# Patient Record
Sex: Male | Born: 1935 | Race: White | Hispanic: No | Marital: Married | Smoking: Former smoker
Health system: Southern US, Community
[De-identification: ages and names within clinical notes are randomized; demographics above are authoritative.]

## PROBLEM LIST (undated history)

## (undated) ENCOUNTER — Encounter

## (undated) ENCOUNTER — Telehealth

## (undated) ENCOUNTER — Telehealth: Attending: Internal Medicine | Primary: Internal Medicine

## (undated) ENCOUNTER — Ambulatory Visit: Payer: MEDICARE

## (undated) ENCOUNTER — Encounter: Attending: Internal Medicine | Primary: Internal Medicine

## (undated) ENCOUNTER — Other Ambulatory Visit

## (undated) ENCOUNTER — Ambulatory Visit: Payer: MEDICARE | Attending: Internal Medicine | Primary: Internal Medicine

## (undated) ENCOUNTER — Encounter: Attending: Oncology | Primary: Oncology

## (undated) ENCOUNTER — Inpatient Hospital Stay

## (undated) ENCOUNTER — Encounter: Attending: Orthopaedic Trauma | Primary: Orthopaedic Trauma

## (undated) ENCOUNTER — Ambulatory Visit: Payer: Medicare (Managed Care) | Attending: Orthopaedic Trauma | Primary: Orthopaedic Trauma

## (undated) ENCOUNTER — Encounter: Attending: Adult Health | Primary: Adult Health

## (undated) ENCOUNTER — Ambulatory Visit

## (undated) ENCOUNTER — Ambulatory Visit: Payer: Medicare (Managed Care)

## (undated) ENCOUNTER — Telehealth: Attending: Oncology | Primary: Oncology

## (undated) DIAGNOSIS — E785 Hyperlipidemia, unspecified: Secondary | ICD-10-CM

## (undated) DIAGNOSIS — J189 Pneumonia, unspecified organism: Secondary | ICD-10-CM

## (undated) DIAGNOSIS — I10 Essential (primary) hypertension: Secondary | ICD-10-CM

## (undated) DIAGNOSIS — Z8619 Personal history of other infectious and parasitic diseases: Secondary | ICD-10-CM

## (undated) DIAGNOSIS — N400 Enlarged prostate without lower urinary tract symptoms: Secondary | ICD-10-CM

## (undated) DIAGNOSIS — E1122 Type 2 diabetes mellitus with diabetic chronic kidney disease: Secondary | ICD-10-CM

## (undated) DIAGNOSIS — L570 Actinic keratosis: Secondary | ICD-10-CM

## (undated) DIAGNOSIS — R011 Cardiac murmur, unspecified: Secondary | ICD-10-CM

## (undated) DIAGNOSIS — C44310 Basal cell carcinoma of skin of unspecified parts of face: Secondary | ICD-10-CM

## (undated) DIAGNOSIS — K219 Gastro-esophageal reflux disease without esophagitis: Secondary | ICD-10-CM

## (undated) DIAGNOSIS — D649 Anemia, unspecified: Secondary | ICD-10-CM

## (undated) DIAGNOSIS — N183 Chronic kidney disease, stage 3 (moderate): Secondary | ICD-10-CM

## (undated) DIAGNOSIS — C9140 Hairy cell leukemia not having achieved remission: Secondary | ICD-10-CM

## (undated) DIAGNOSIS — K5790 Diverticulosis of intestine, part unspecified, without perforation or abscess without bleeding: Secondary | ICD-10-CM

## (undated) DIAGNOSIS — E11319 Type 2 diabetes mellitus with unspecified diabetic retinopathy without macular edema: Secondary | ICD-10-CM

## (undated) DIAGNOSIS — IMO0001 Reserved for inherently not codable concepts without codable children: Secondary | ICD-10-CM

## (undated) DIAGNOSIS — E538 Deficiency of other specified B group vitamins: Secondary | ICD-10-CM

## (undated) DIAGNOSIS — K635 Polyp of colon: Secondary | ICD-10-CM

## (undated) DIAGNOSIS — Z8701 Personal history of pneumonia (recurrent): Secondary | ICD-10-CM

## (undated) HISTORY — PX: SKIN CANCER EXCISION: SHX779

## (undated) HISTORY — DX: Hairy cell leukemia not having achieved remission: C91.40

## (undated) HISTORY — DX: Chronic kidney disease, stage 3 (moderate): N18.3

## (undated) HISTORY — DX: Deficiency of other specified B group vitamins: E53.8

## (undated) HISTORY — DX: Type 2 diabetes mellitus with diabetic chronic kidney disease: E11.22

## (undated) HISTORY — DX: Cardiac murmur, unspecified: R01.1

## (undated) HISTORY — DX: Type 2 diabetes mellitus with unspecified diabetic retinopathy without macular edema: E11.319

## (undated) HISTORY — DX: Diverticulosis of intestine, part unspecified, without perforation or abscess without bleeding: K57.90

## (undated) HISTORY — DX: Pneumonia, unspecified organism: J18.9

## (undated) HISTORY — DX: Personal history of pneumonia (recurrent): Z87.01

## (undated) HISTORY — DX: Benign prostatic hyperplasia without lower urinary tract symptoms: N40.0

## (undated) HISTORY — PX: CATARACT EXTRACTION W/ INTRAOCULAR LENS  IMPLANT, BILATERAL: SHX1307

## (undated) HISTORY — DX: Personal history of other infectious and parasitic diseases: Z86.19

## (undated) HISTORY — DX: Polyp of colon: K63.5

## (undated) HISTORY — DX: Actinic keratosis: L57.0

---
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
## 1999-04-20 DIAGNOSIS — Z8701 Personal history of pneumonia (recurrent): Secondary | ICD-10-CM

## 1999-04-20 HISTORY — DX: Personal history of pneumonia (recurrent): Z87.01

## 2004-08-19 DIAGNOSIS — C9140 Hairy cell leukemia not having achieved remission: Secondary | ICD-10-CM

## 2004-08-19 HISTORY — DX: Hairy cell leukemia not having achieved remission: C91.40

## 2004-10-01 ENCOUNTER — Inpatient Hospital Stay: Payer: Self-pay | Admitting: Internal Medicine

## 2004-10-01 IMAGING — US ABDOMEN ULTRASOUND
1 series · 17 of 25 positions shown · non-contrast
Comparison: none

REASON FOR EXAM: panacytopena
COMMENTS:

[Series 1: abdomen ultrasound · 17 of 75 slices shown]
[im 1/75]
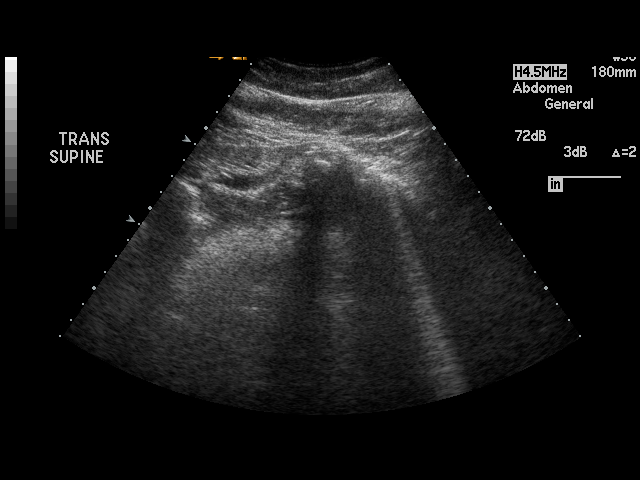
[im 7/75]
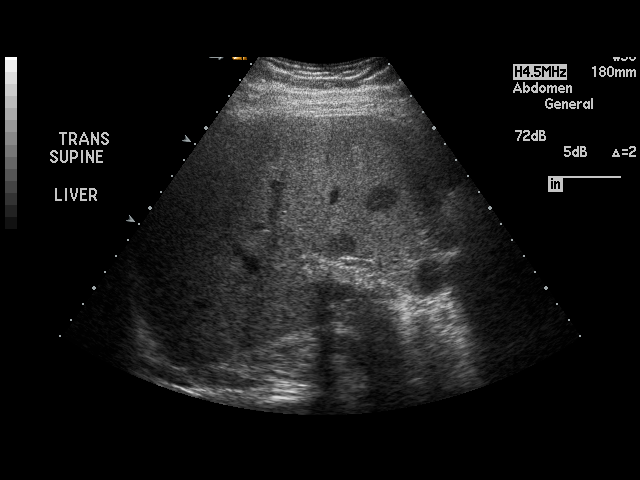
[im 10/75]
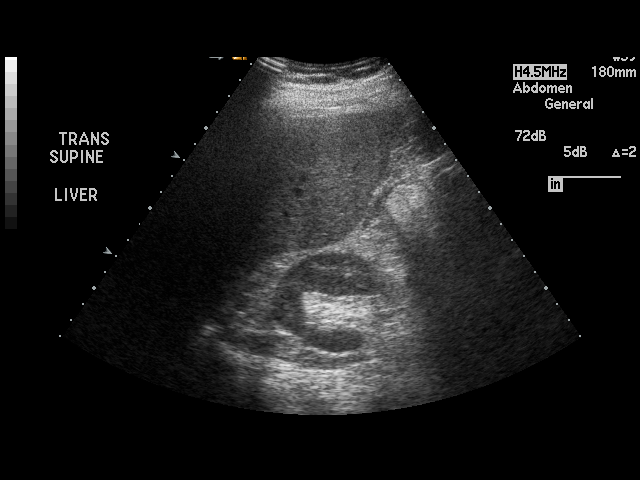
[im 16/75]
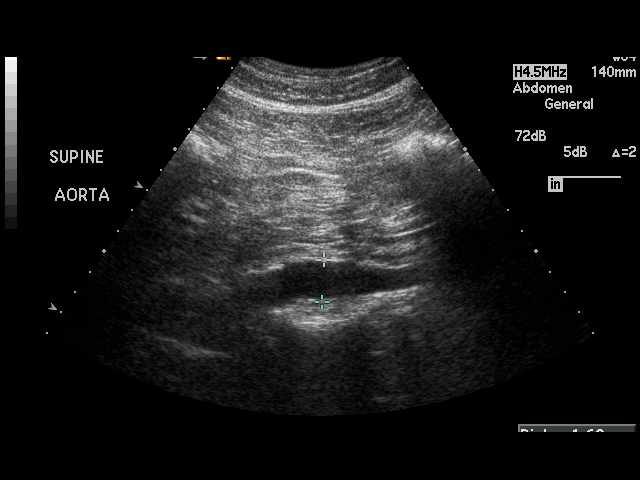
[im 19/75]
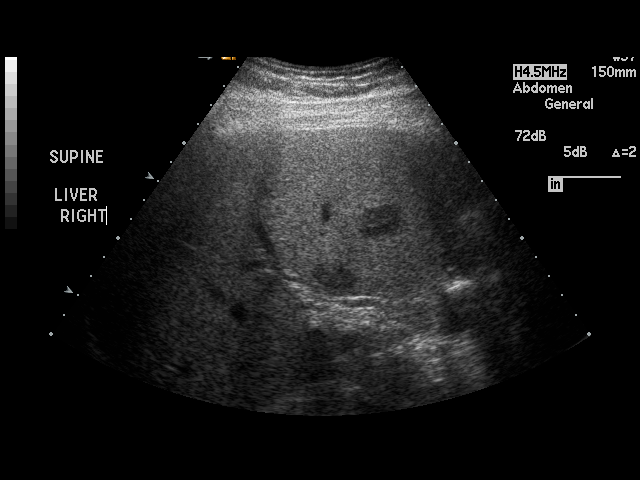
[im 25/75]
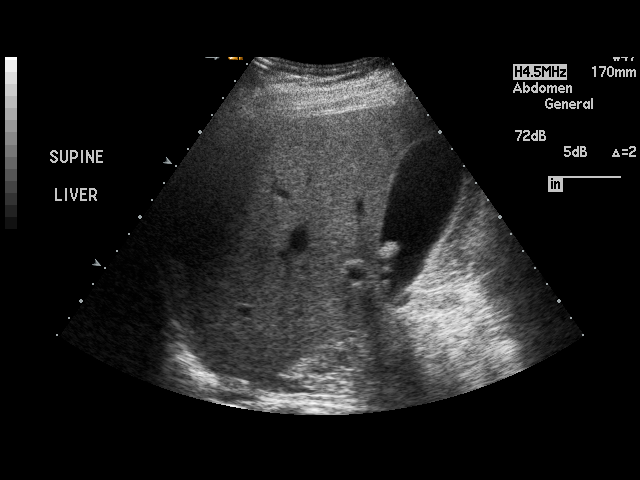
[im 28/75]
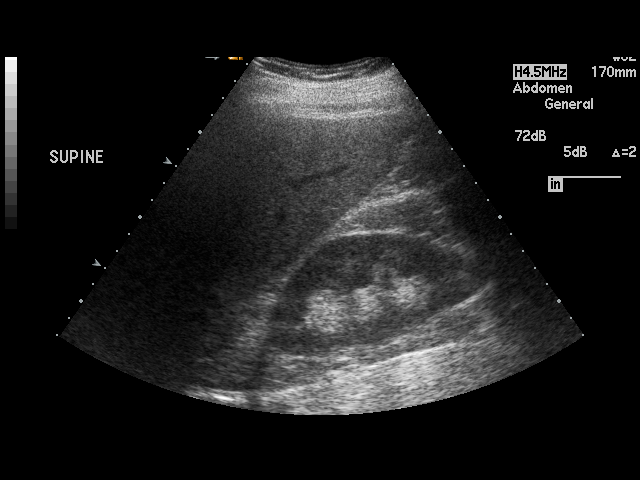
[im 34/75]
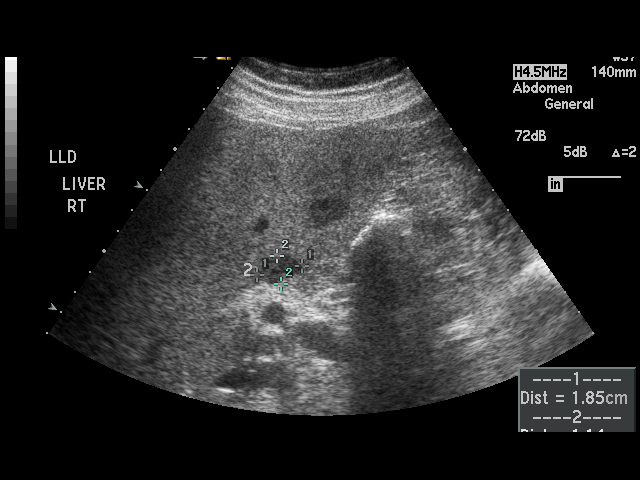
[im 38/75]
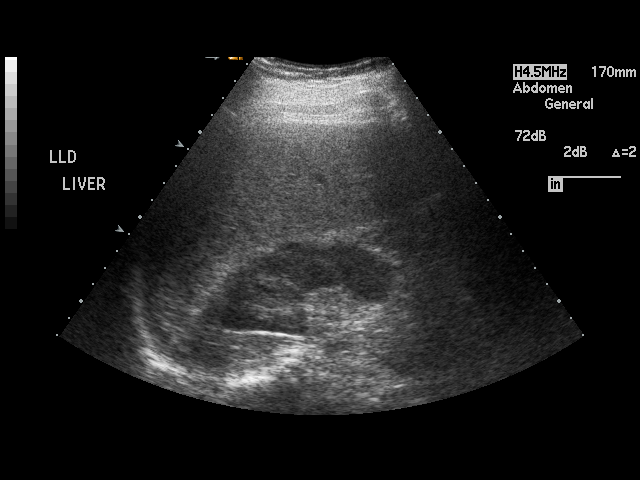
[im 41/75]
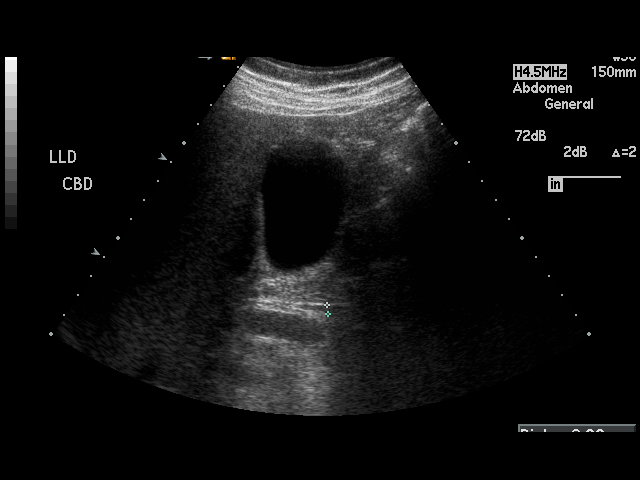
[im 47/75]
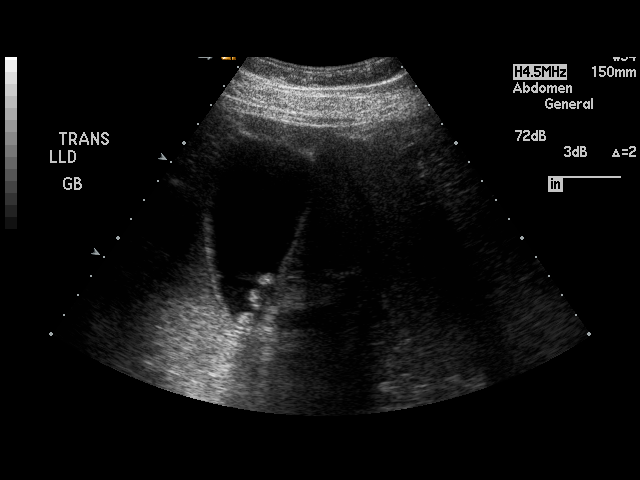
[im 50/75]
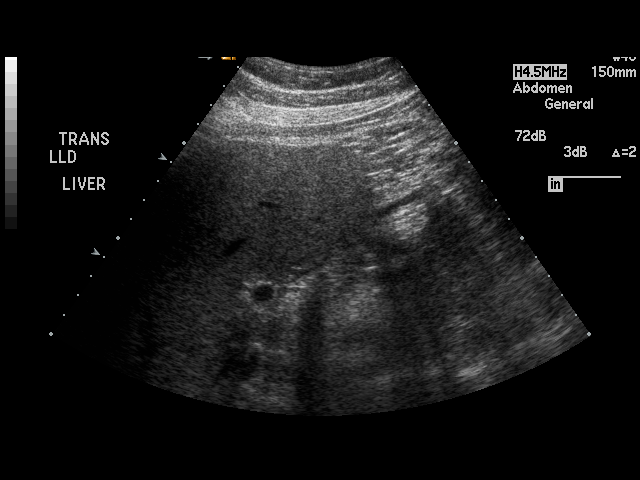
[im 56/75]
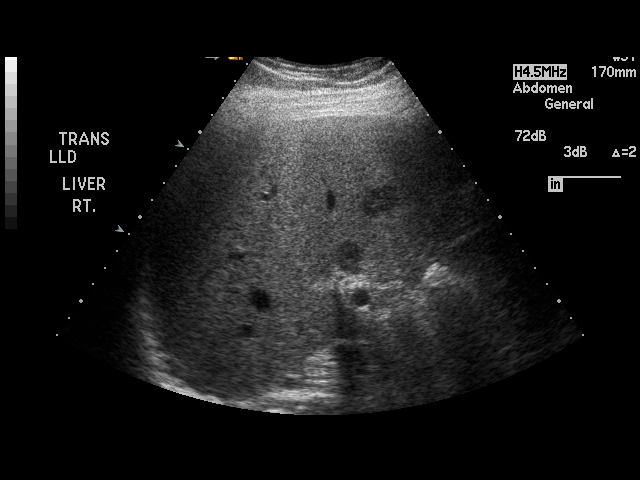
[im 59/75]
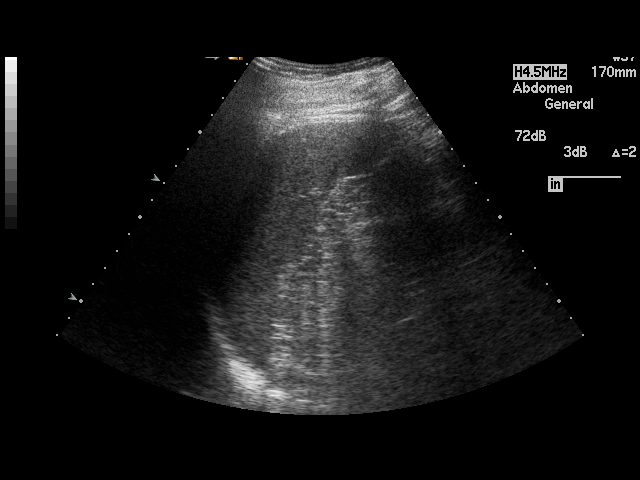
[im 65/75]
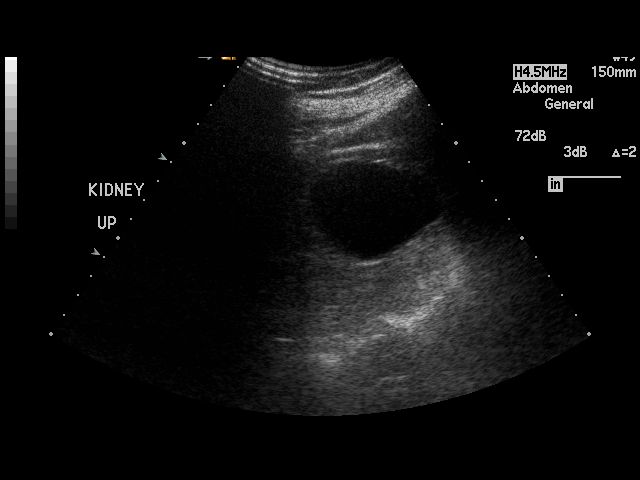
[im 68/75]
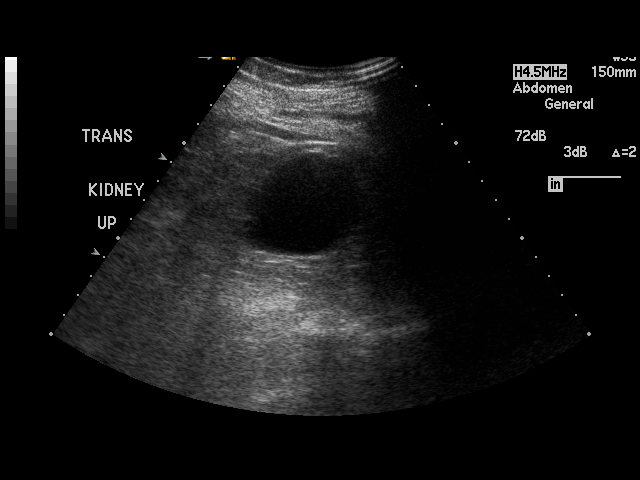
[im 75/75]
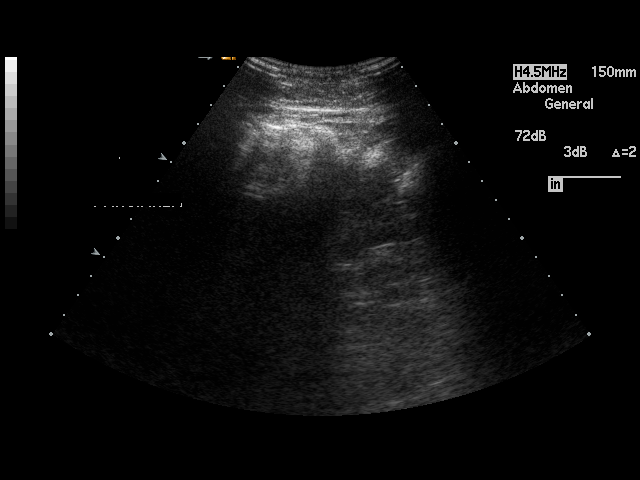

[17 of 25 positions shown; findings below may reference images not displayed]

PROCEDURE:     US  - US ABDOMEN GENERAL SURVEY  - [DATE]  [DATE]

RESULT:     The patient has unexplained low blood counts.

The spleen exhibits normal size measuring 12.7 cm in greatest dimension, but
measuring as little as 3.4 cm in transverse dimension. The liver exhibits
two areas of decreased echogenicity, one near the porta hepatis and the
other in the LEFT lobe.  These measure between 1.5 and 1.7 cm in diameter.
I see no intrahepatic ductal dilation.  The gallbladder is adequately
distended and contains multiple echogenic shadowing stones. There is no
sonographic Murphy sign.  The common bile duct is normal at 3.9 mm in
diameter.  The pancreas could not be demonstrated due to the presence of
bowel gas.

The LEFT kidney exhibits hypodensity in its upper pole measuring
approximately 4.5 x 5.1 x 4 cm.  This is most compatible with a benign
simple cyst.  The RIGHT kidney exhibits normal echotexture.  There is no
evidence of ascites.
IMPRESSION: 1)There are multiple gallstones present.

2)There are two hypoechoic areas in the liver.  One lies near the porta
hepatis while the second cyst lies in the LEFT lobe in a paramedian
location.

3)There is a benign simple appearing cyst in the upper pole of the LEFT
kidney measuring as much as 5.1 cm in diameter.

## 2004-10-03 ENCOUNTER — Ambulatory Visit: Payer: Self-pay | Admitting: Internal Medicine

## 2004-10-17 ENCOUNTER — Ambulatory Visit: Payer: Self-pay | Admitting: Family Medicine

## 2004-11-17 ENCOUNTER — Ambulatory Visit: Payer: Self-pay | Admitting: Family Medicine

## 2004-12-17 ENCOUNTER — Ambulatory Visit: Payer: Self-pay | Admitting: Internal Medicine

## 2005-01-17 ENCOUNTER — Ambulatory Visit: Payer: Self-pay | Admitting: Family Medicine

## 2005-03-06 ENCOUNTER — Ambulatory Visit: Payer: Self-pay | Admitting: Internal Medicine

## 2005-03-19 ENCOUNTER — Ambulatory Visit: Payer: Self-pay | Admitting: Internal Medicine

## 2005-05-09 ENCOUNTER — Ambulatory Visit: Payer: Self-pay | Admitting: Internal Medicine

## 2005-05-19 ENCOUNTER — Ambulatory Visit: Payer: Self-pay | Admitting: Internal Medicine

## 2005-05-20 IMAGING — US ABDOMEN ULTRASOUND
1 series · 8 of 8 positions shown · non-contrast
Comparison: none

REASON FOR EXAM: Hairy cell leukemia, anemia, neutropenia
COMMENTS:

[Series 1: abdomen ultrasound · 8 of 8 slices shown]
[im 1/8]
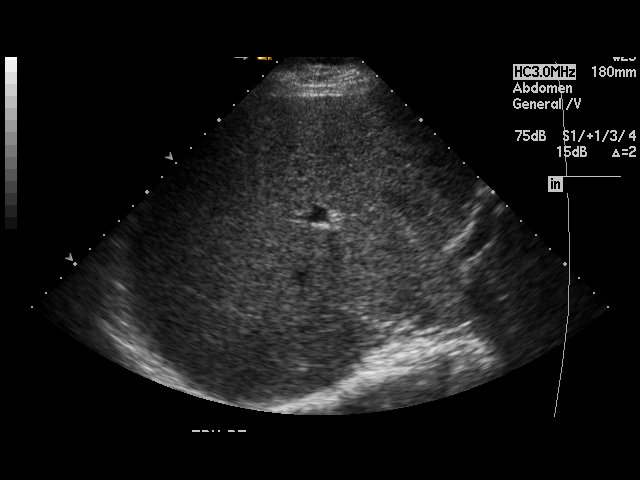
[im 2/8]
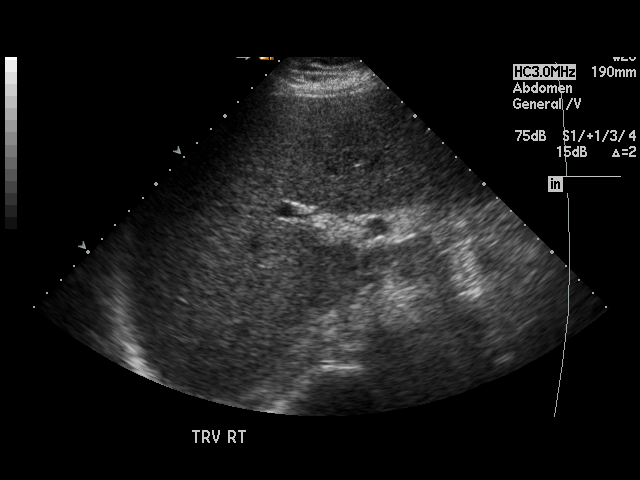
[im 3/8]
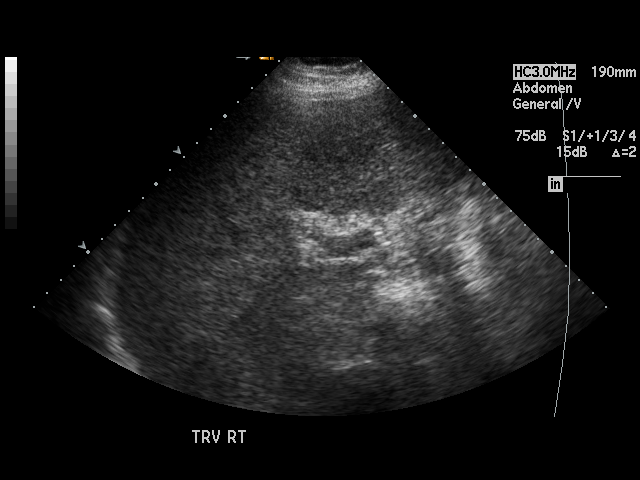
[im 4/8]
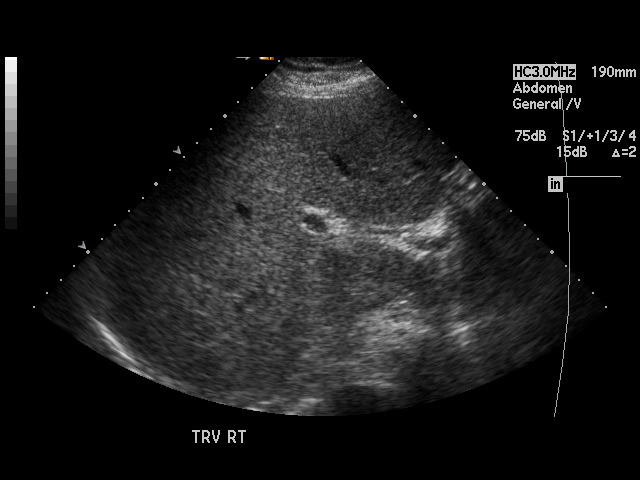
[im 5/8]
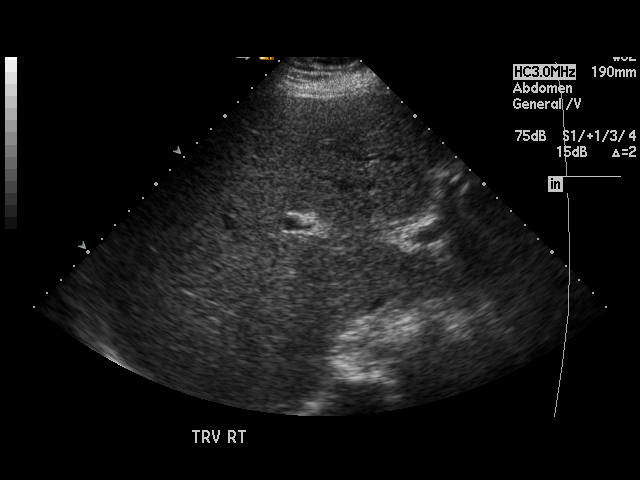
[im 6/8]
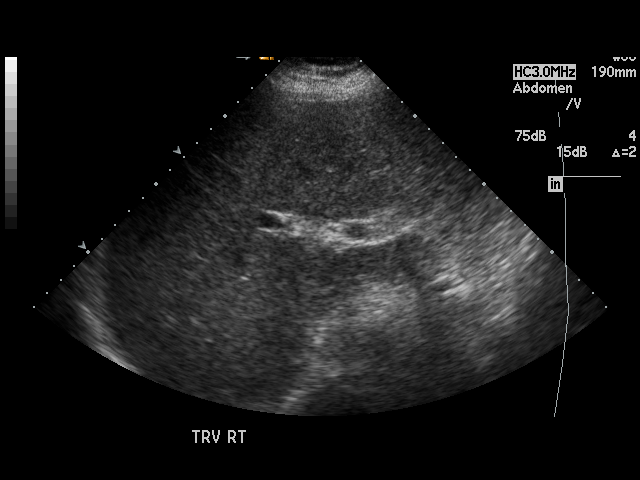
[im 7/8]
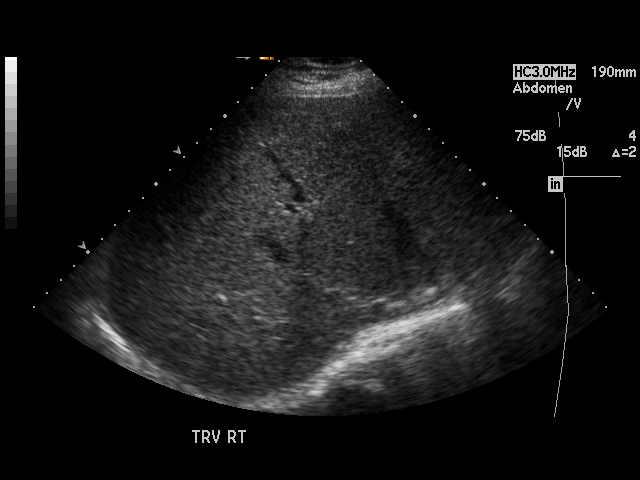
[im 8/8]
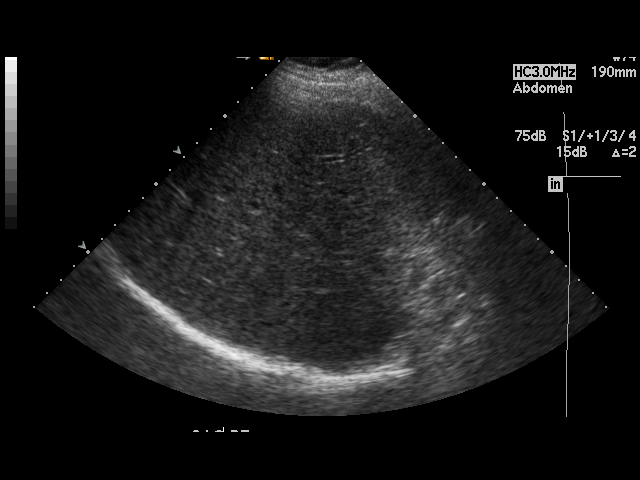

[8 of 8 positions shown; findings below may reference images not displayed]

PROCEDURE:     US  - US ABDOMEN GENERAL SURVEY  - [DATE] [DATE]

RESULT:     The hepatic echo pattern shows no specific abnormalities.  The
focal hypoechoic areas noted on the prior exam of [DATE] are no longer
seen.  The spleen is normal in size.  There are noted multiple echo
densities in the gallbladder compatible with gallstones.  There is no
thickening of the gallbladder wall.  The common bile duct measures 4.2 mm in
diameter which is within normal limits.  The kidneys show no hydronephrosis.
 There is a cyst at the upper pole of the LEFT kidney.  The cyst measures
approximately 5 cm at maximum diameter. No ascites is seen.  The pancreas is
normal in appearance.
IMPRESSION: Cholelithiasis.

Previously noted hypoechoic areas in the liver are no longer seen.

There is no ascites.

LEFT renal cyst.

## 2005-05-20 IMAGING — US ABDOMEN ULTRASOUND
1 series · 14 of 25 positions shown · non-contrast
Comparison: none

REASON FOR EXAM: Hairy cell leukemia, anemia, neutropenia
COMMENTS:

[Series 1: abdomen ultrasound · 0.39mm/px · 14 of 59 slices shown]
[im 1/59]
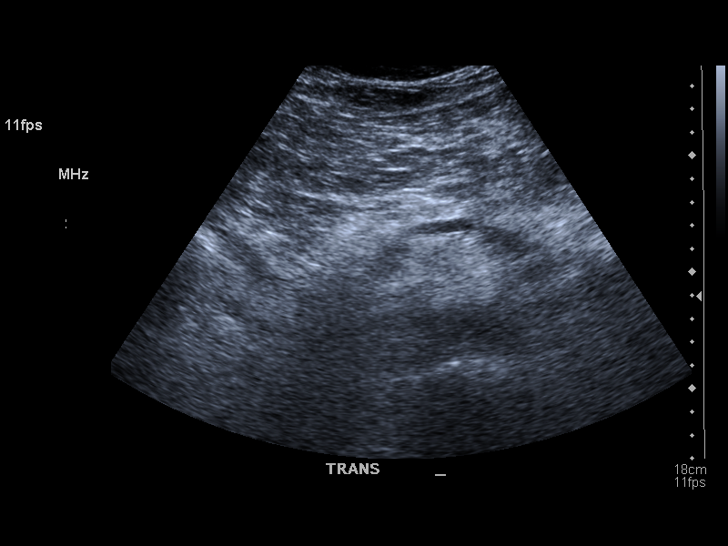
[im 5/59]
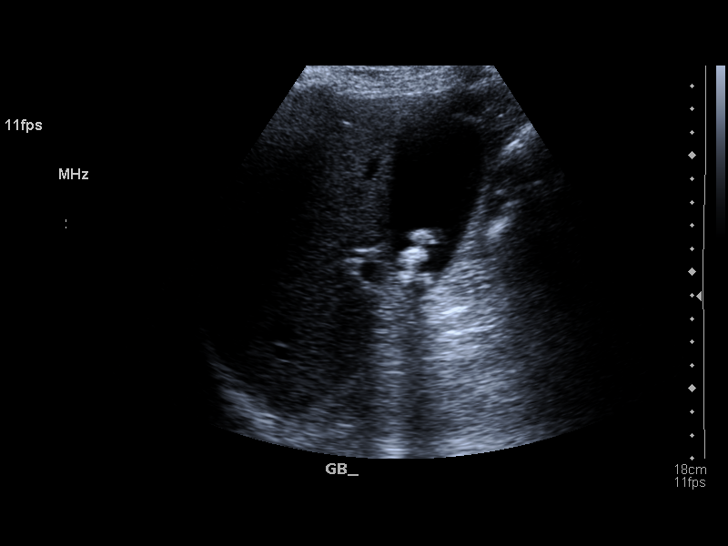
[im 10/59]
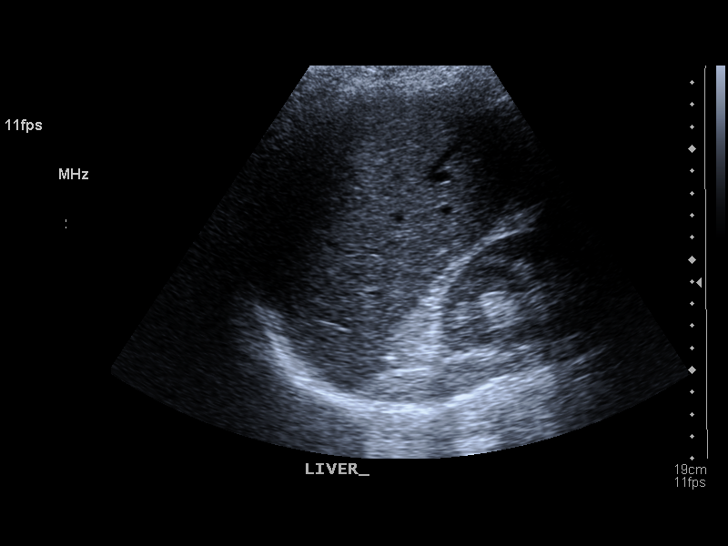
[im 15/59]
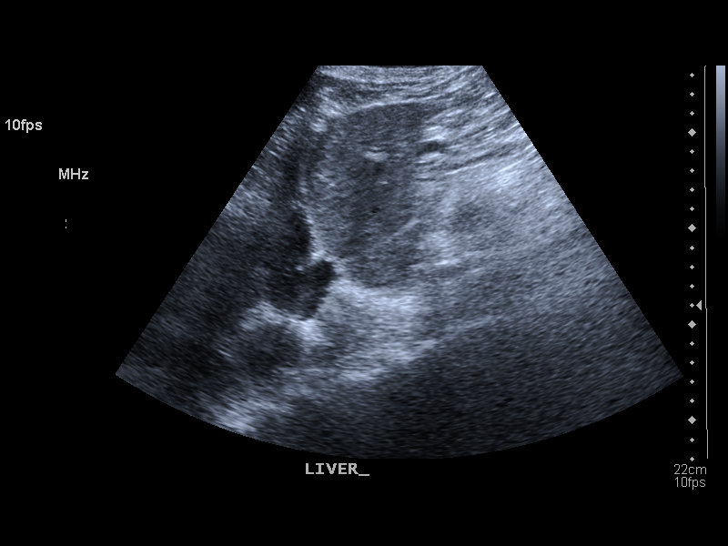
[im 20/59]
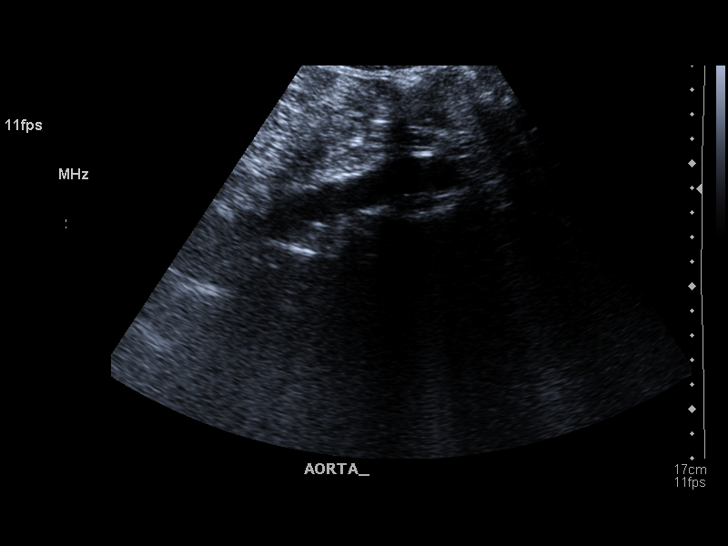
[im 22/59]
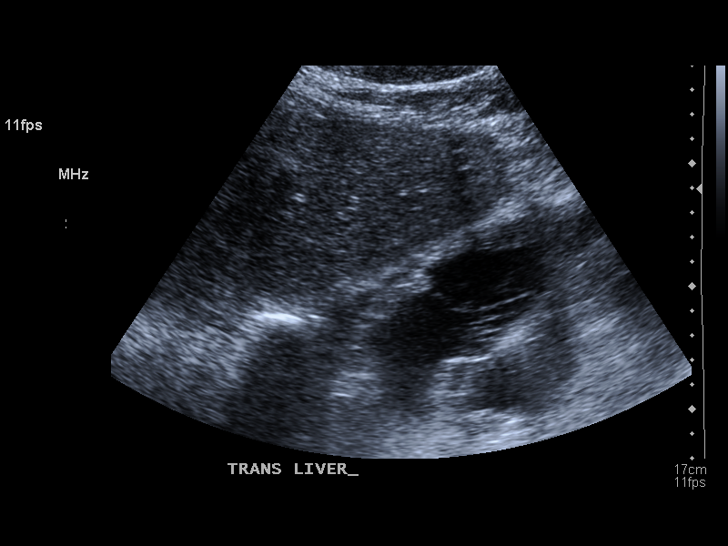
[im 27/59]
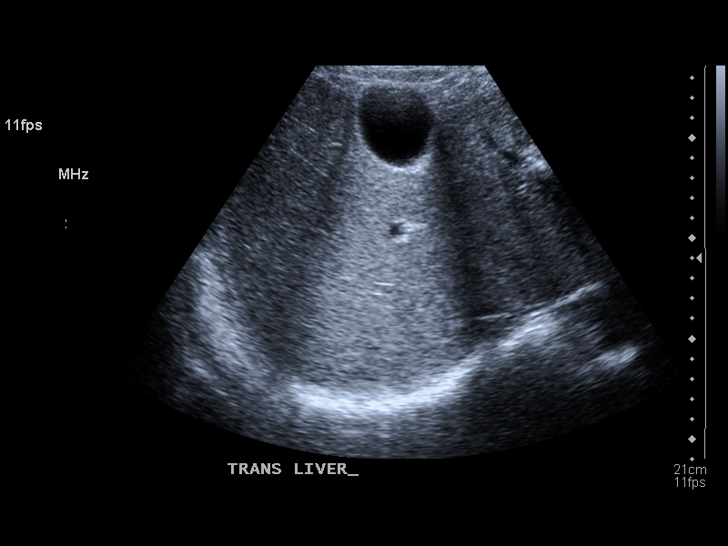
[im 32/59]
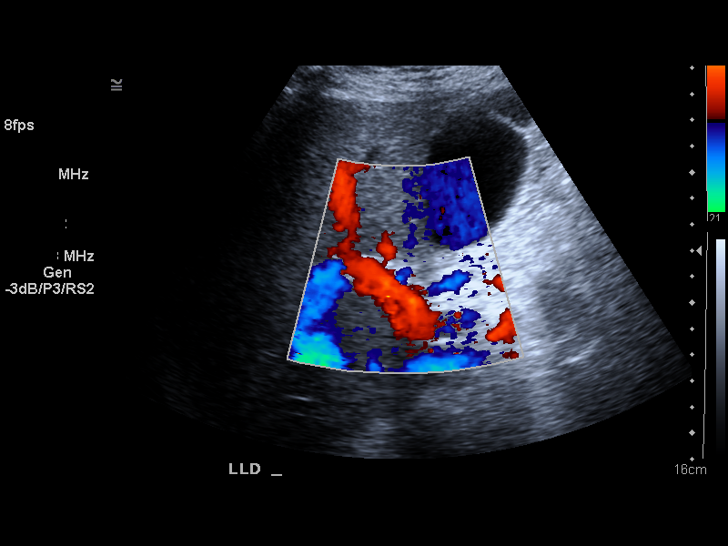
[im 37/59]
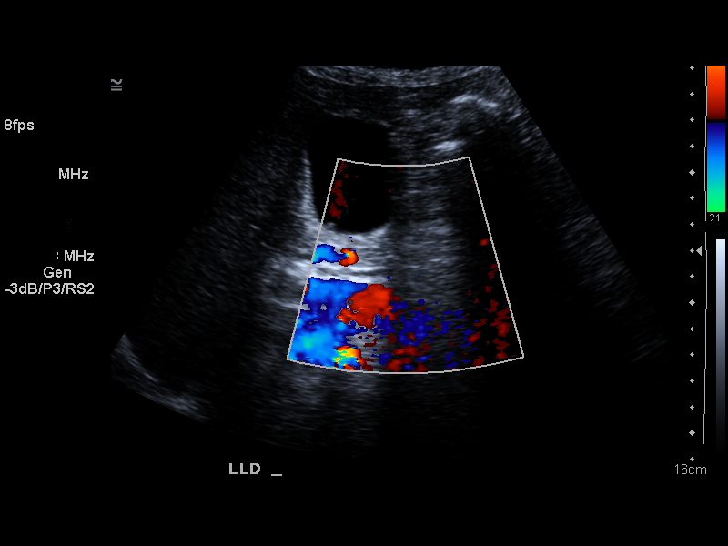
[im 39/59]
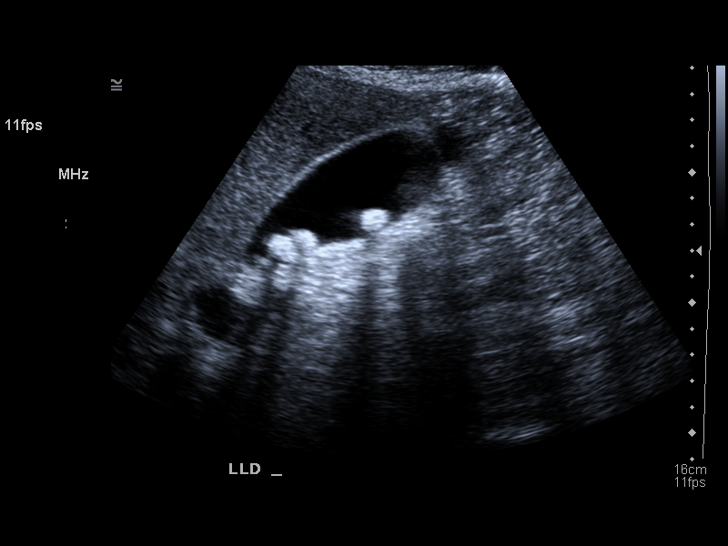
[im 44/59]
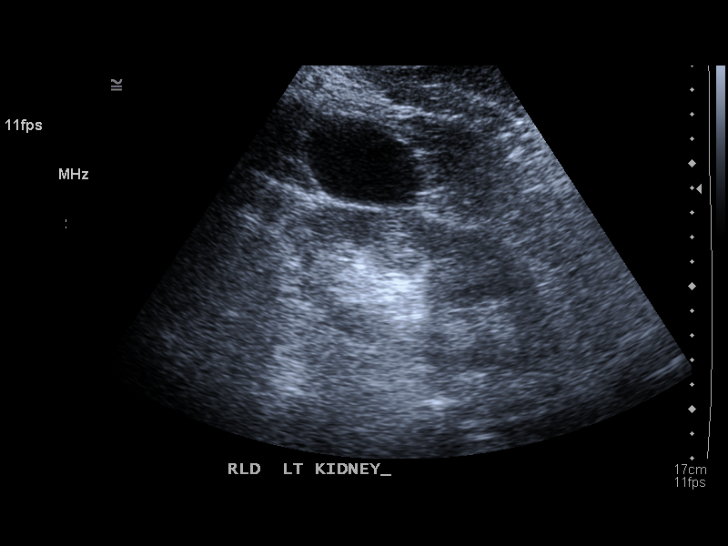
[im 49/59]
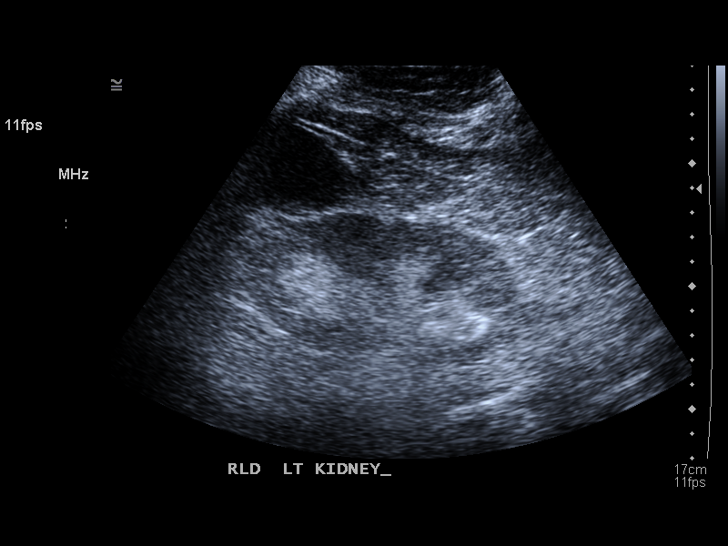
[im 54/59]
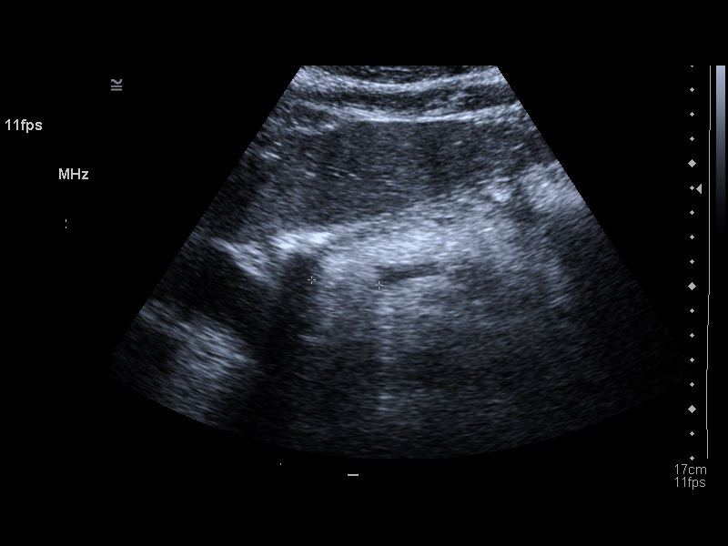
[im 59/59]
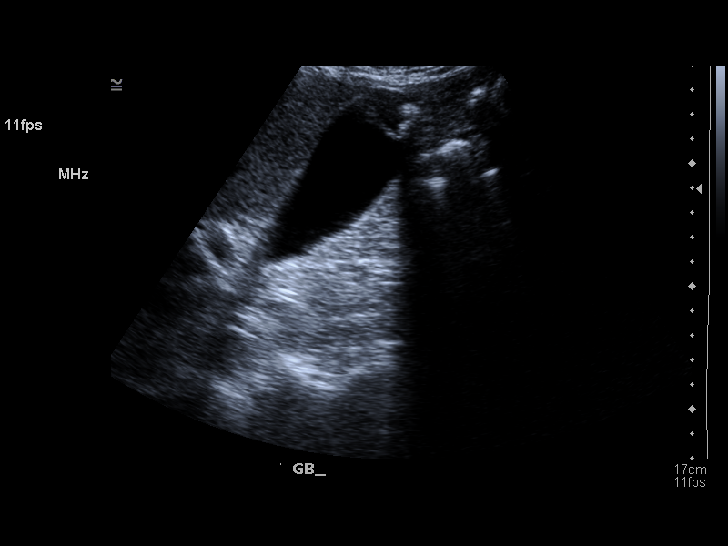

[14 of 25 positions shown; findings below may reference images not displayed]

PROCEDURE:     US  - US ABDOMEN GENERAL SURVEY  - [DATE] [DATE]

RESULT:     The hepatic echo pattern shows no specific abnormalities.  The
focal hypoechoic areas noted on the prior exam of [DATE] are no longer
seen.  The spleen is normal in size.  There are noted multiple echo
densities in the gallbladder compatible with gallstones.  There is no
thickening of the gallbladder wall.  The common bile duct measures 4.2 mm in
diameter which is within normal limits.  The kidneys show no hydronephrosis.
 There is a cyst at the upper pole of the LEFT kidney.  The cyst measures
approximately 5 cm at maximum diameter. No ascites is seen.  The pancreas is
normal in appearance.
IMPRESSION: Cholelithiasis.

Previously noted hypoechoic areas in the liver are no longer seen.

There is no ascites.

LEFT renal cyst.

## 2005-06-19 ENCOUNTER — Ambulatory Visit: Payer: Self-pay | Admitting: Internal Medicine

## 2005-06-28 ENCOUNTER — Inpatient Hospital Stay: Payer: Self-pay | Admitting: Internal Medicine

## 2005-06-30 IMAGING — CR DG CHEST 2V
1 series · 2 of 2 positions shown · non-contrast
Comparison: none

REASON FOR EXAM: NEUTROPENIC FEVER, HYPOXIA
COMMENTS:

[Series 2218: postero_anterior · 0.11mm/px · 2 of 2 slices shown]
[im 1/2]
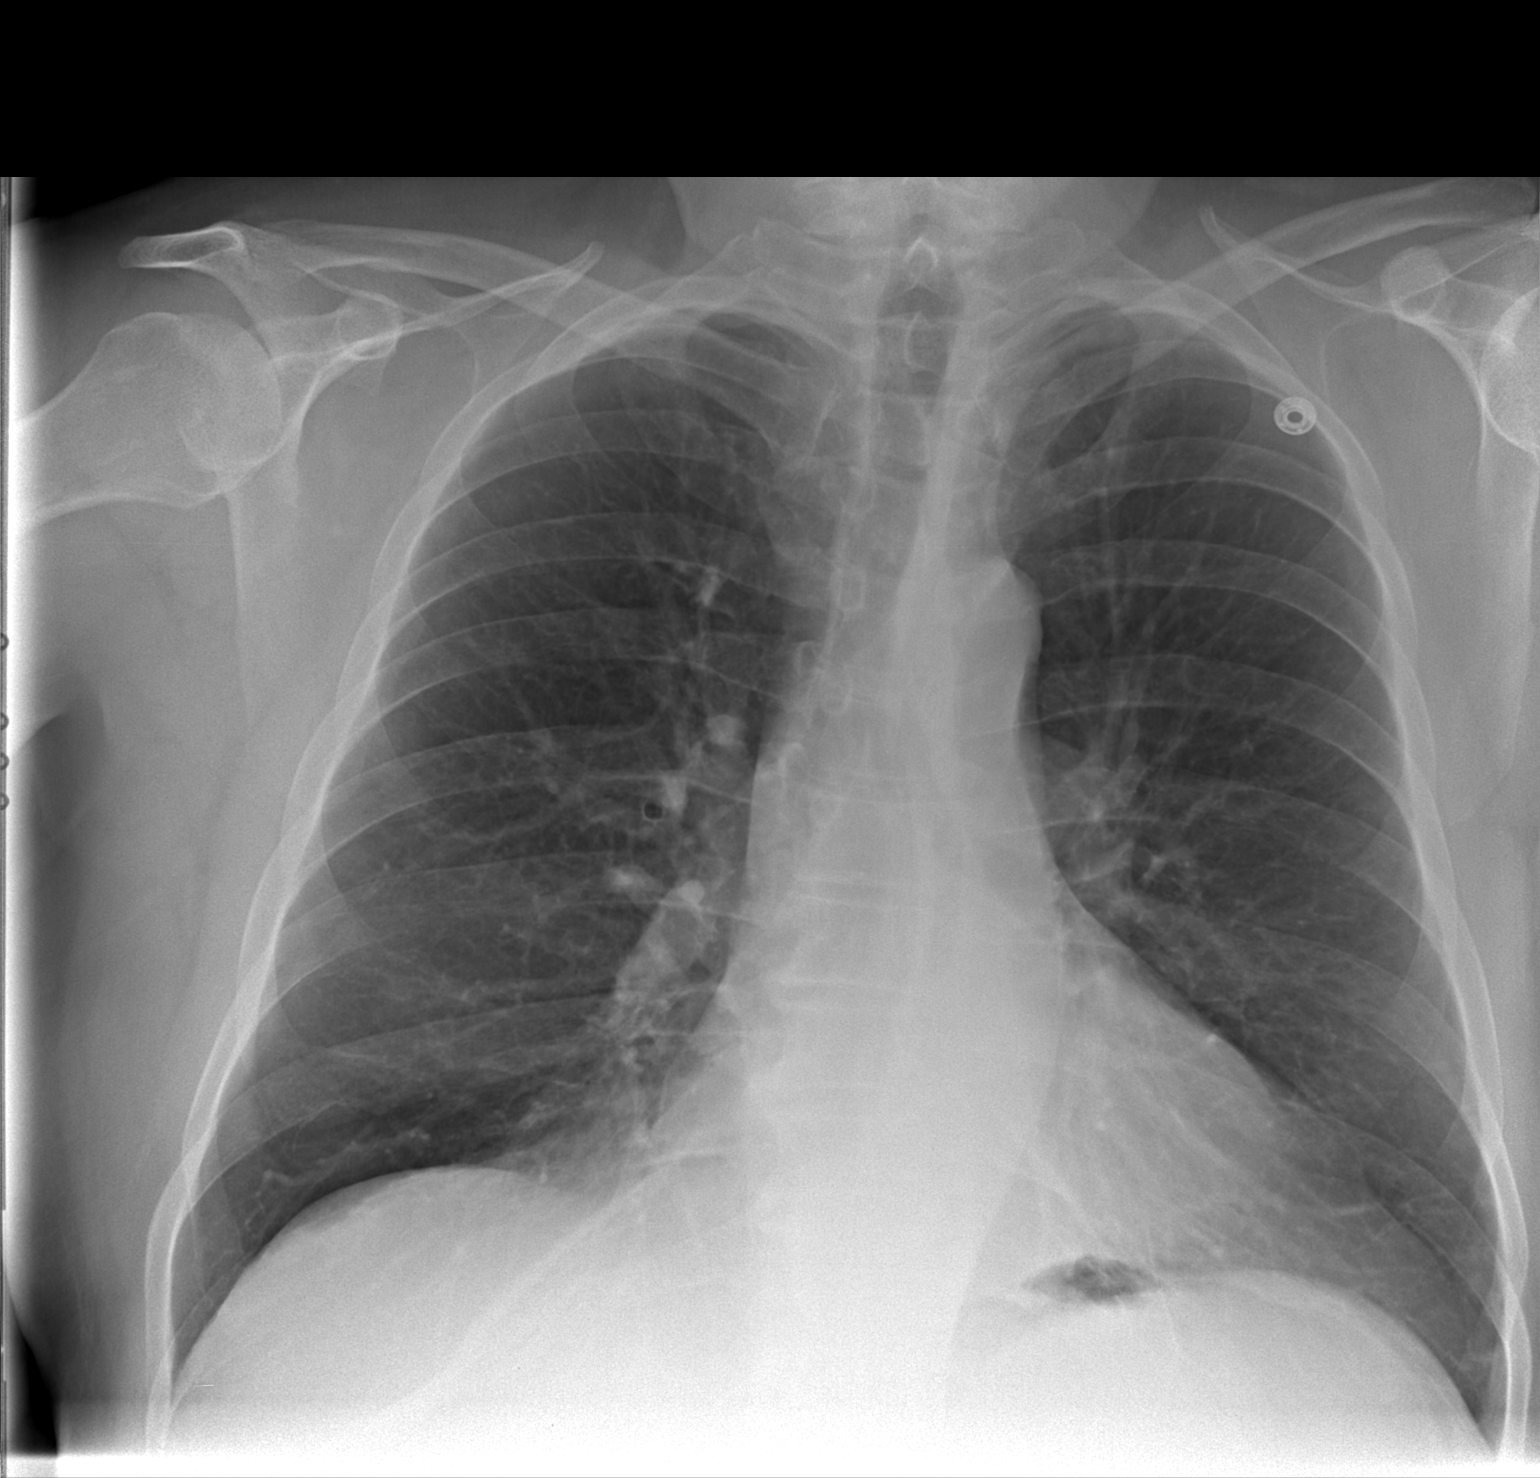
[im 2/2]
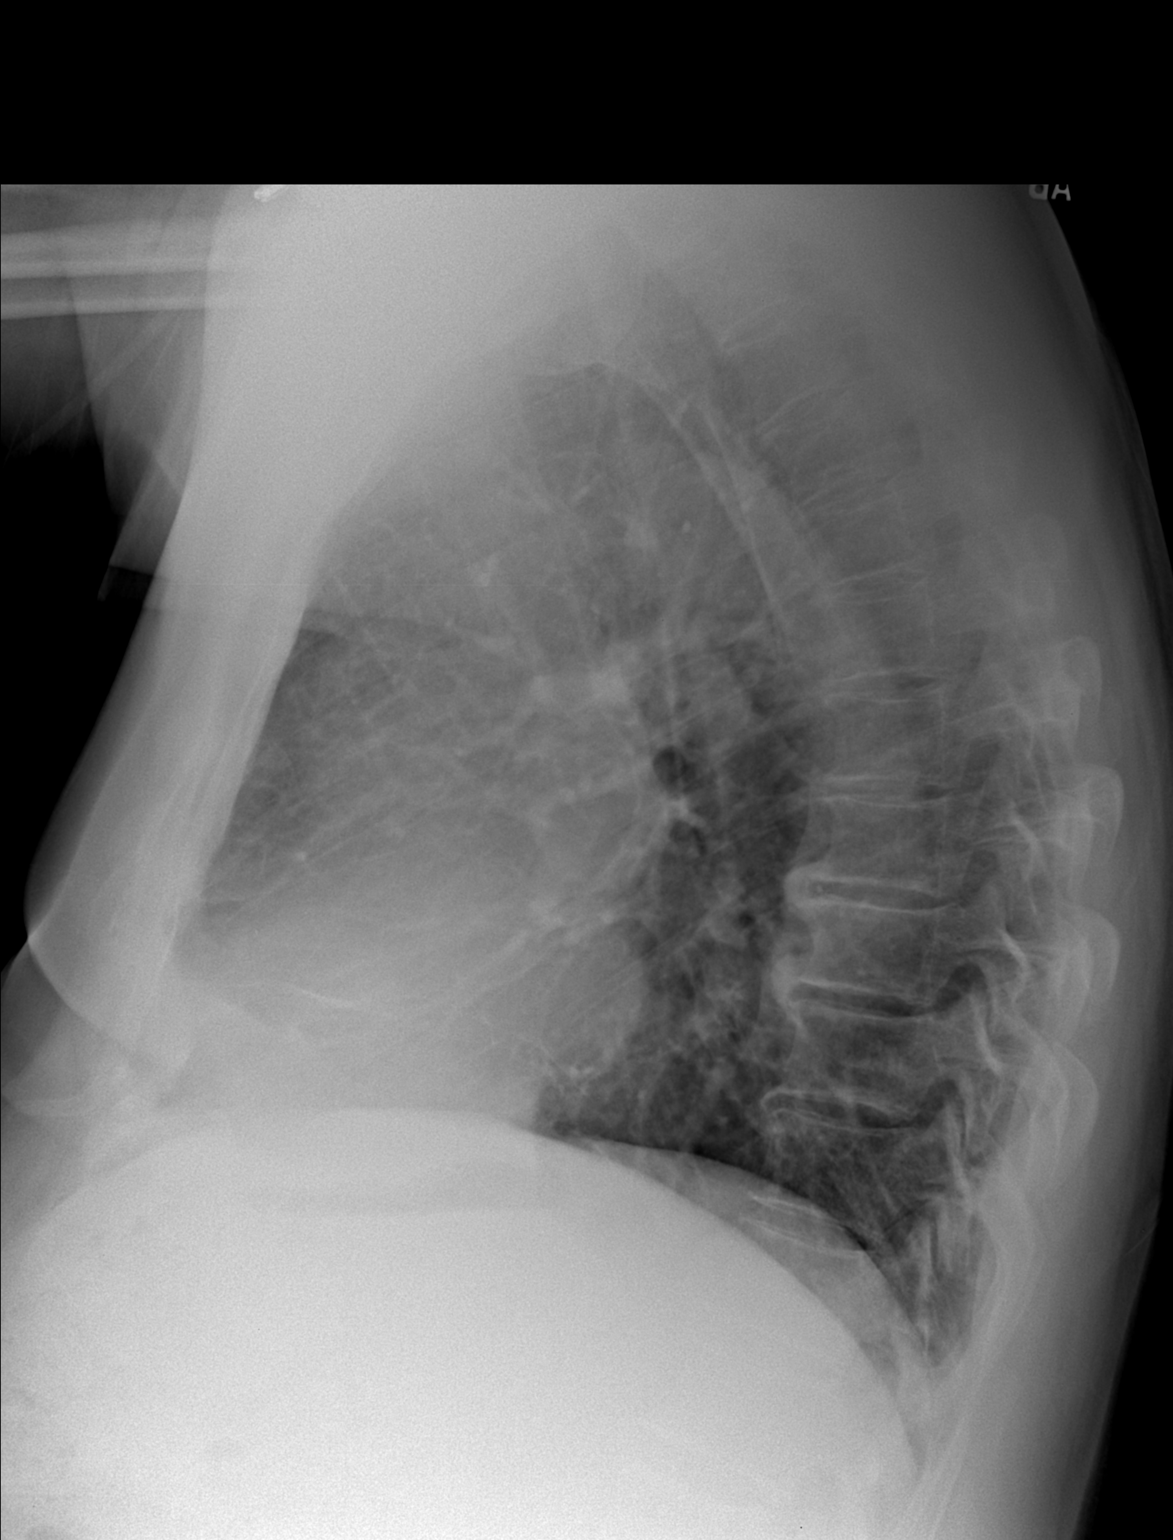

[2 of 2 positions shown; findings below may reference images not displayed]

PROCEDURE:     DXR - DXR CHEST PA (OR AP) AND LATERAL  - [DATE] [DATE]

RESULT:          Comparison is made to a study [DATE].

The lungs are adequately inflated.  There is no focal infiltrate.  The heart
is top normal in size.  There is tortuosity of the descending thoracic
aorta.  The mildly increased interstitial markings seen on a prior study
have improved.
IMPRESSION: 1.     There is improved appearance of the chest suggesting resolution of
atelectasis.  Followup films are recommended if the patient has any
persistent pulmonary symptoms.
2.     There may be an element of underlying COPD or reactive airway
disease.

## 2005-07-01 IMAGING — CR DG CHEST 1V PORT
1 series · 1 of 1 positions shown · non-contrast
Comparison: none

REASON FOR EXAM: recurrent neutropenic
COMMENTS:

PROCEDURE:     DXR - DXR PORTABLE CHEST SINGLE VIEW  - [DATE]  [DATE]
RESULT:     AP view of the chest is compared to the prior exam of [DATE].
The current exam shows the lung fields to be clear. No pneumonia,
pneumothorax or pleural effusion is seen. Heart size is normal.

[view not recorded]
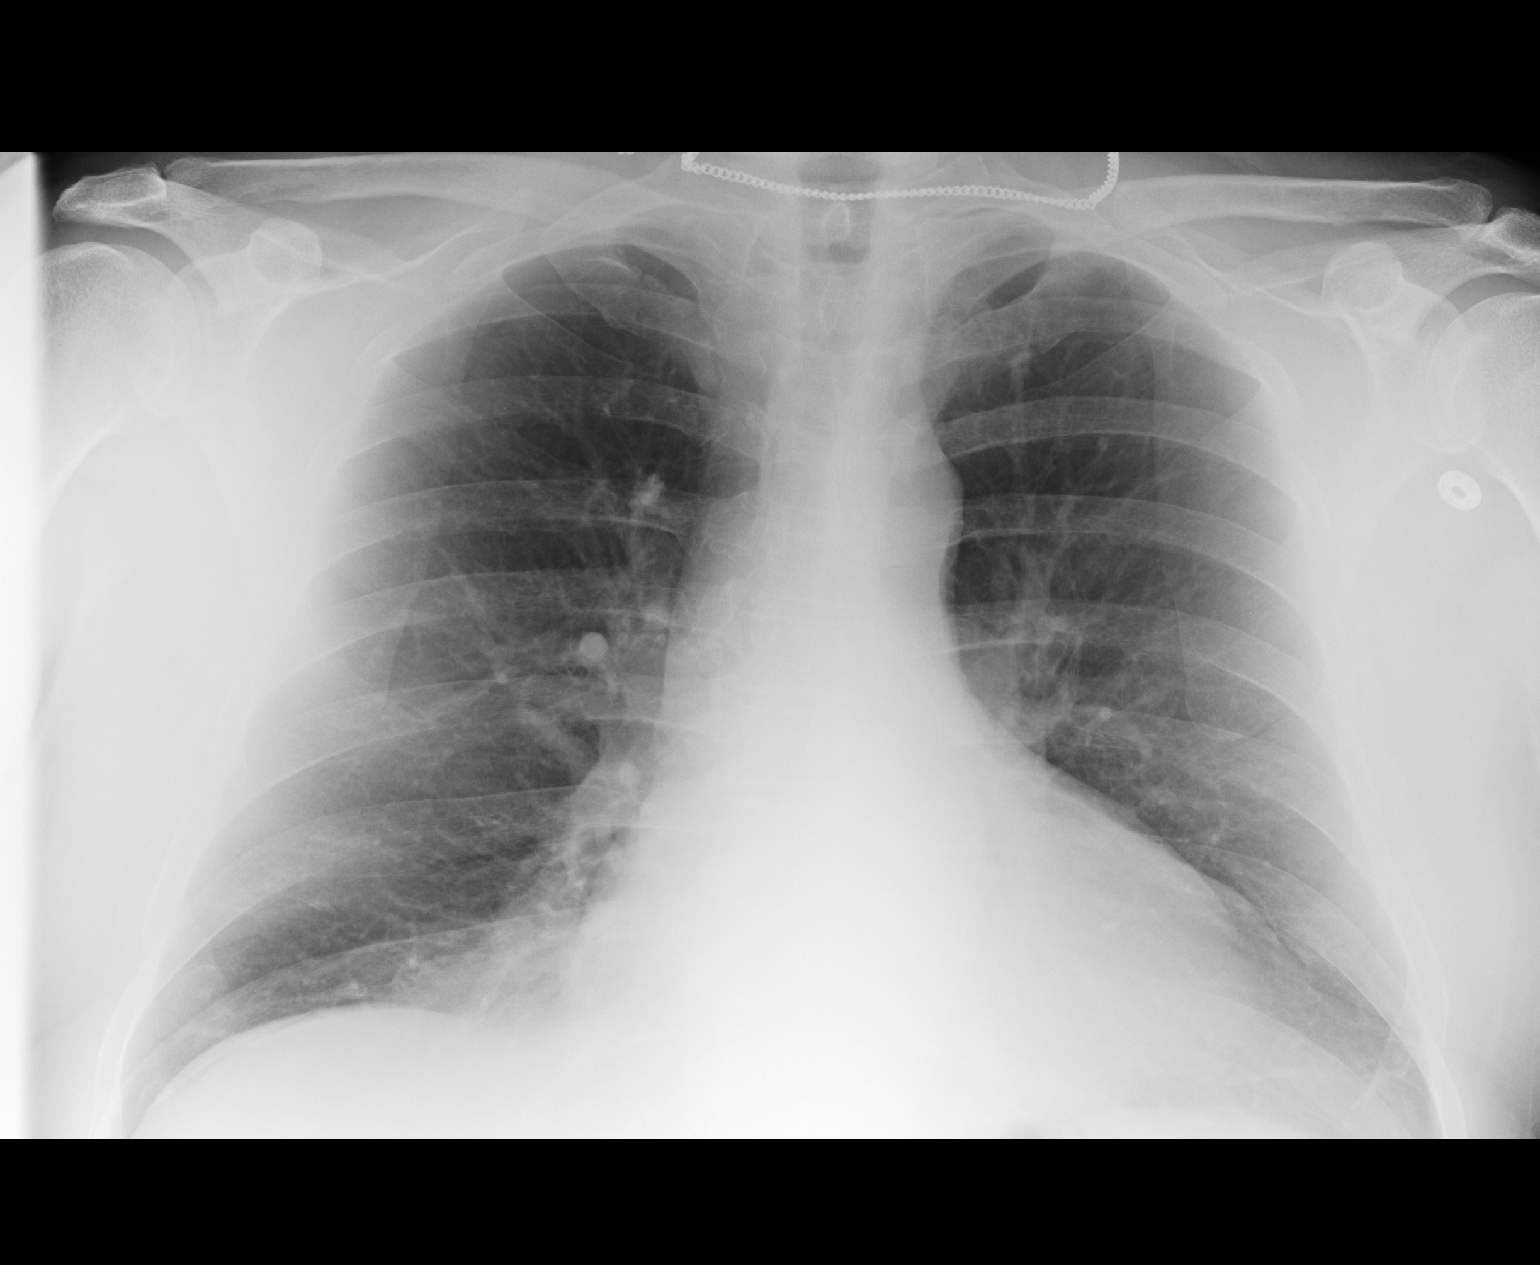

[1 of 1 positions shown; findings below may reference images not displayed]

IMPRESSION: 1)No acute changes are identified.

## 2005-07-09 IMAGING — CR DG CHEST 2V
1 series · 2 of 2 positions shown · non-contrast
Comparison: none

REASON FOR EXAM: Fever
COMMENTS:

[Series 2401: lateral · 0.11mm/px · 2 of 2 slices shown]
[im 1/2]
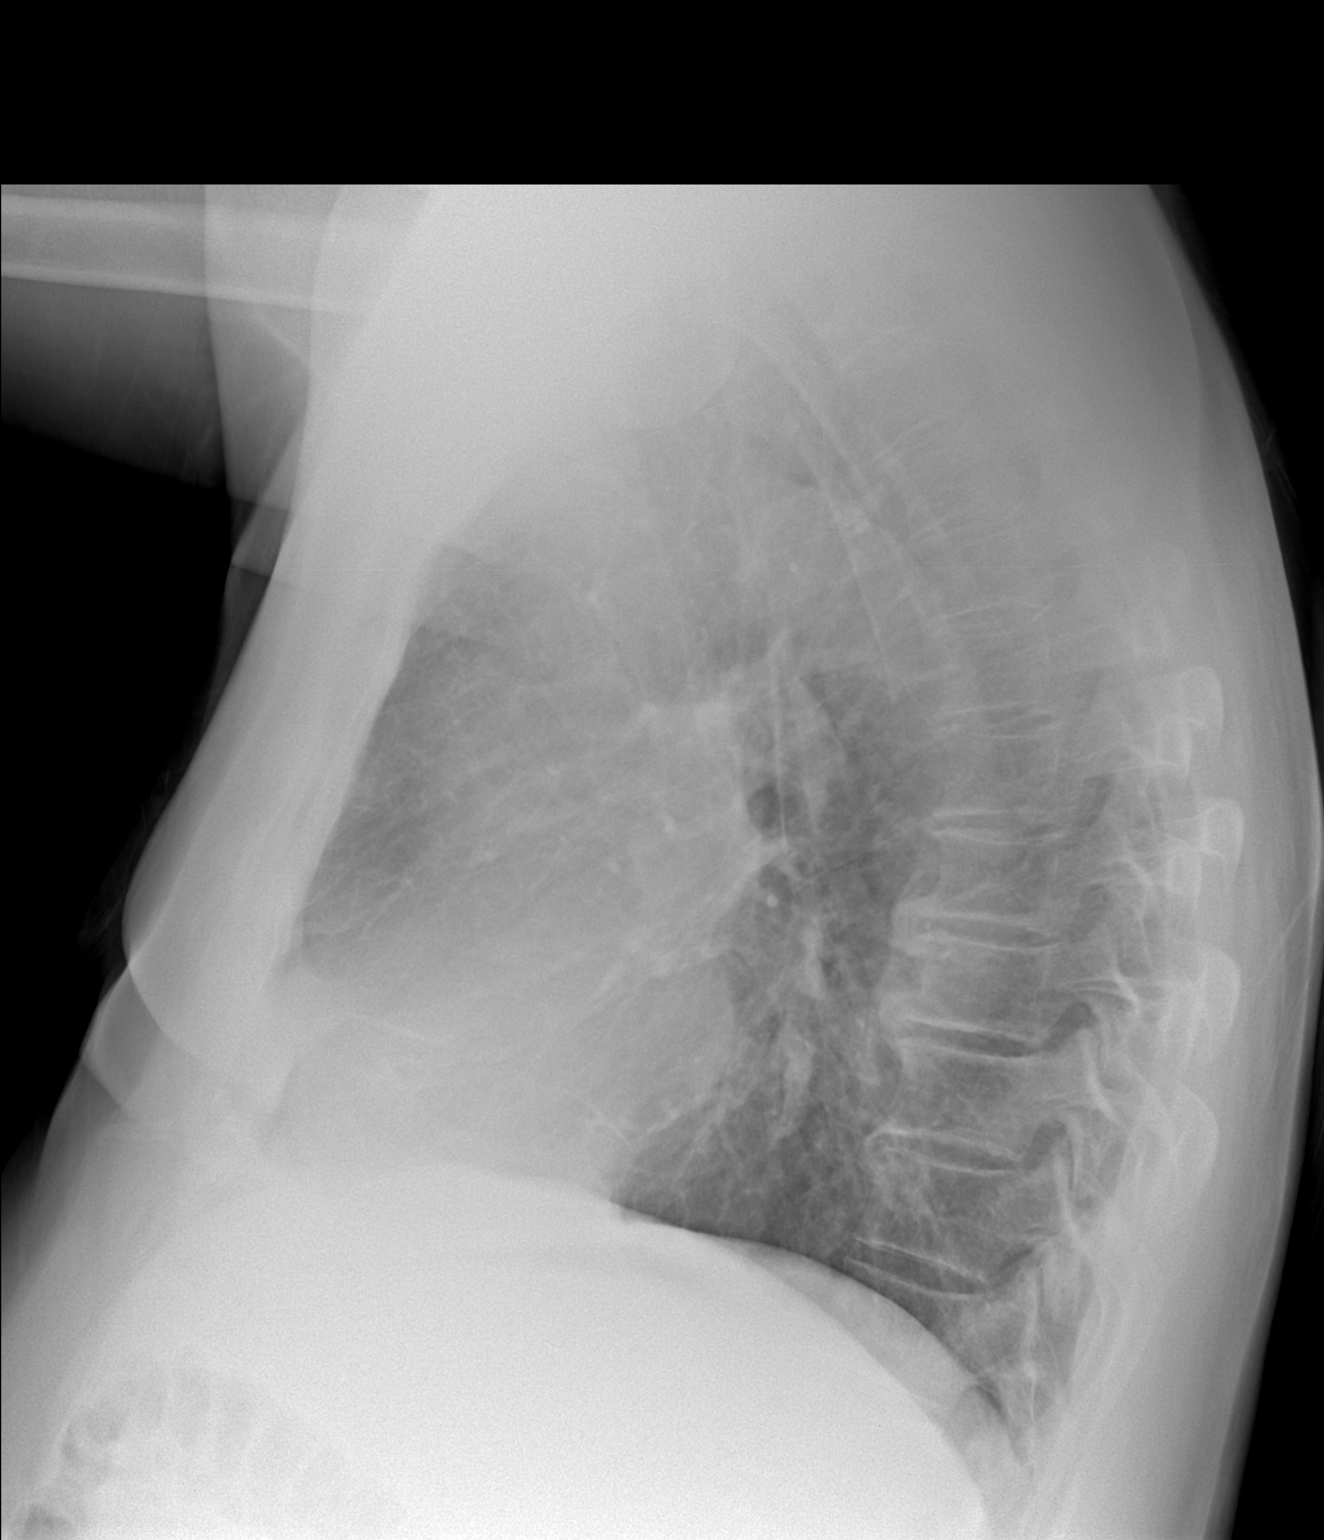
[im 2/2]
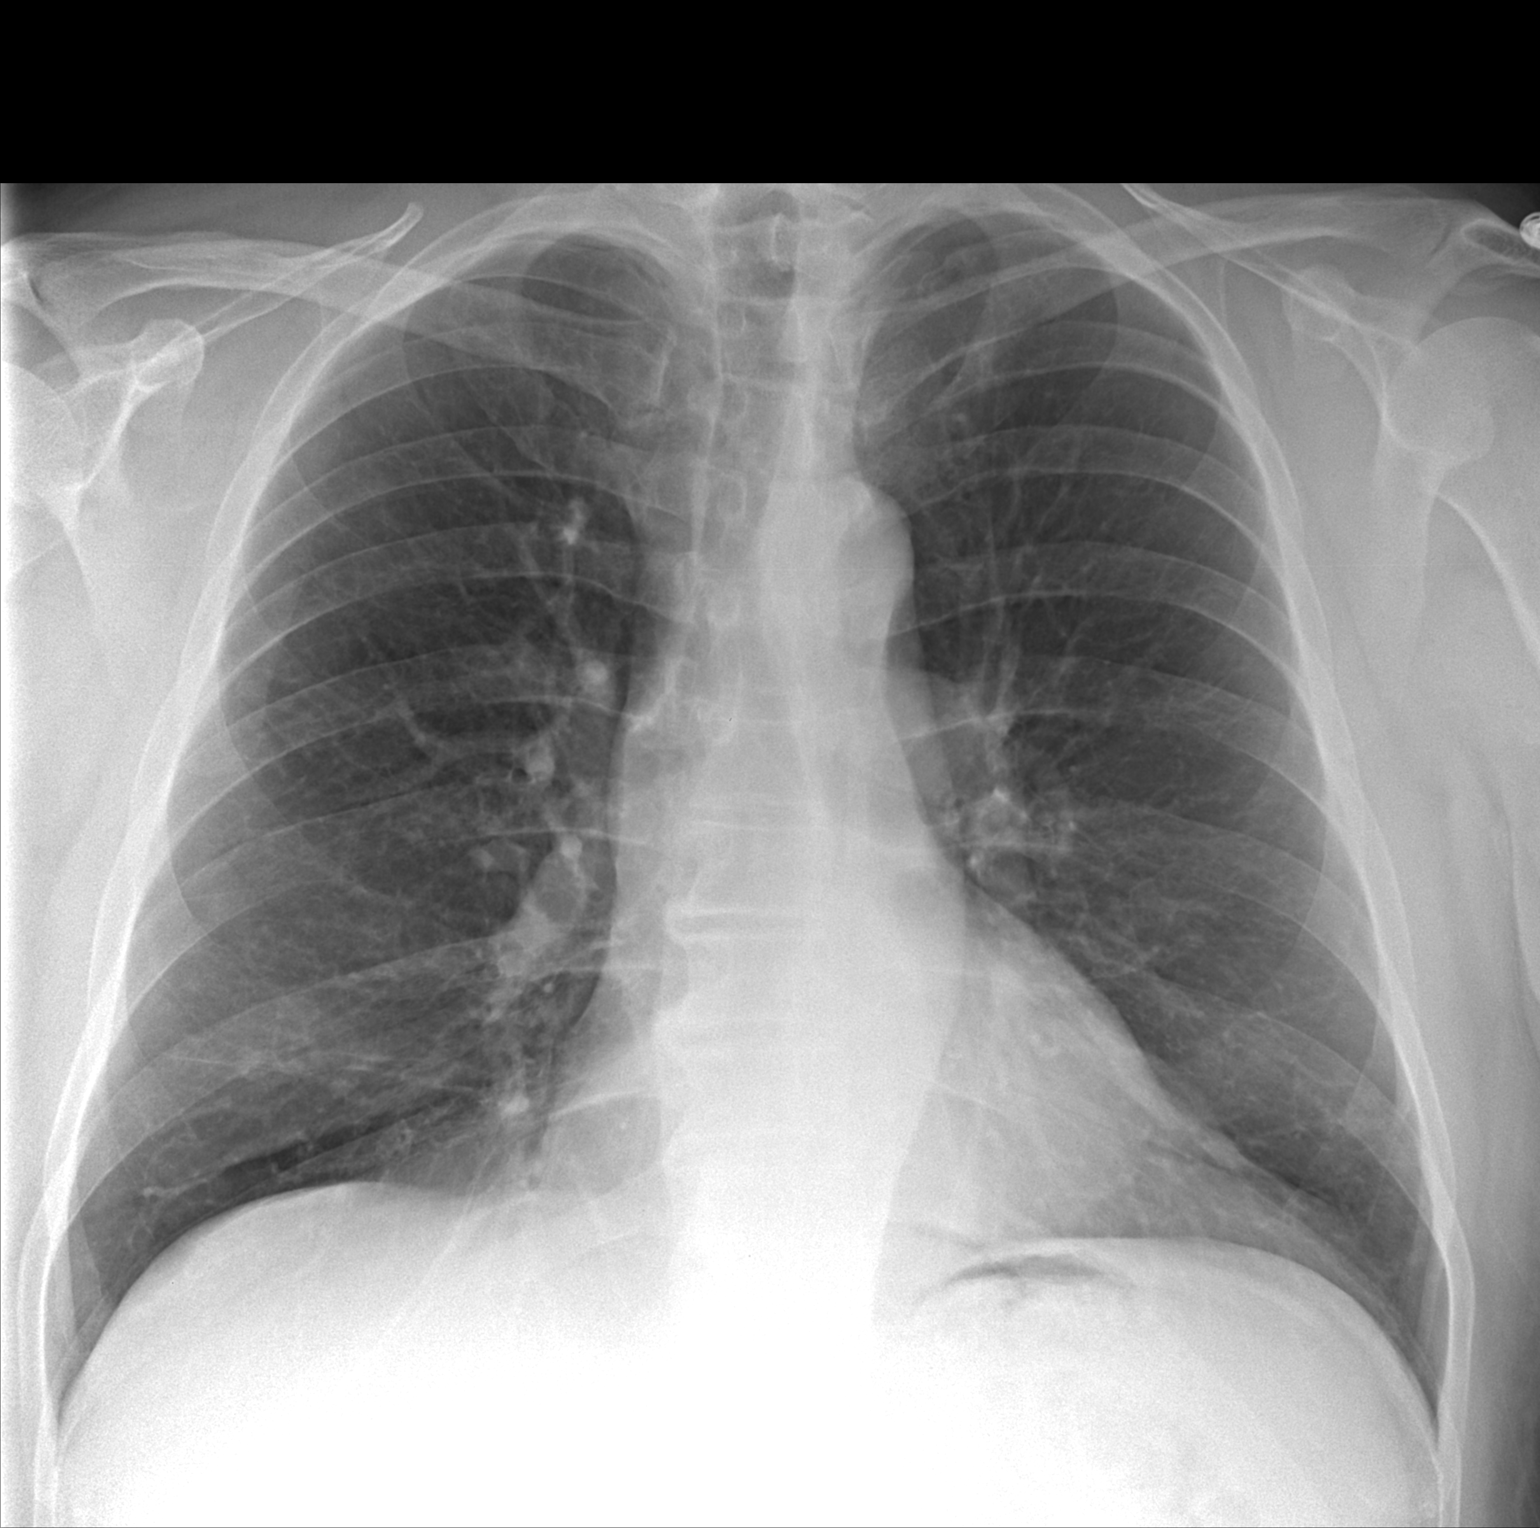

[2 of 2 positions shown; findings below may reference images not displayed]

PROCEDURE:     DXR - DXR CHEST PA (OR AP) AND LATERAL  - [DATE]  [DATE]

RESULT:     Comparison is made to a study of [DATE].

The lungs are adequately inflated.  There is no focal infiltrate.  The heart
is not enlarged.  There is tortuosity of the descending thoracic aorta.
There is no pleural effusion.  The mediastinum is not widened.
IMPRESSION: I do not see objective evidence of acute cardiopulmonary disease.
Specifically I do not see findings to suggest pneumonia.  Follow-up films
are recommended if the patient's symptoms persist.

## 2005-07-09 IMAGING — CT CT PARANASAL SINUSES W/O CM
1 series · 16 of 28 positions shown, 20 images · non-contrast
Comparison: none

REASON FOR EXAM: Fever, neutropenia
COMMENTS:

[Series 2: sinus · axial · 0.39mm/px · z∈[+949,+1065]mm · 16 of 28 slices shown, 20 images]
[im 2/28  brain]
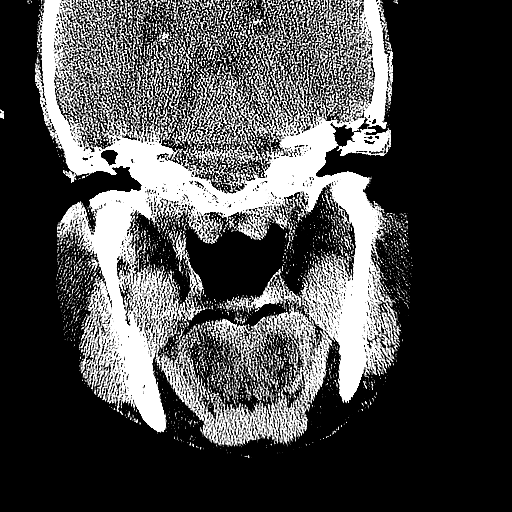
[im 2/28  bone]
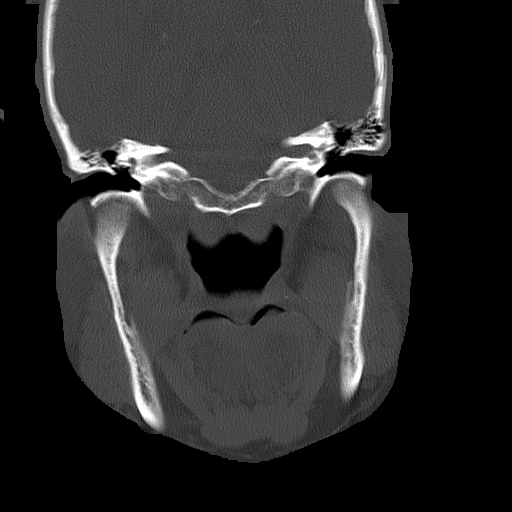
[im 4/28  bone]
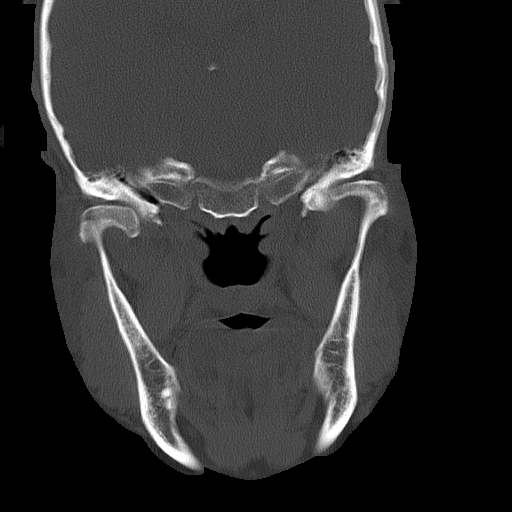
[im 6/28  bone]
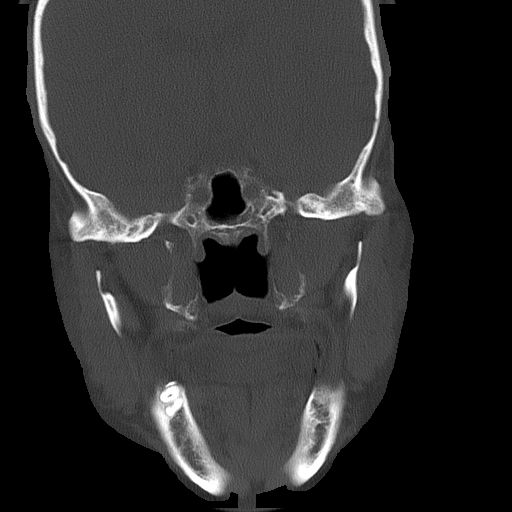
[im 7/28  bone]
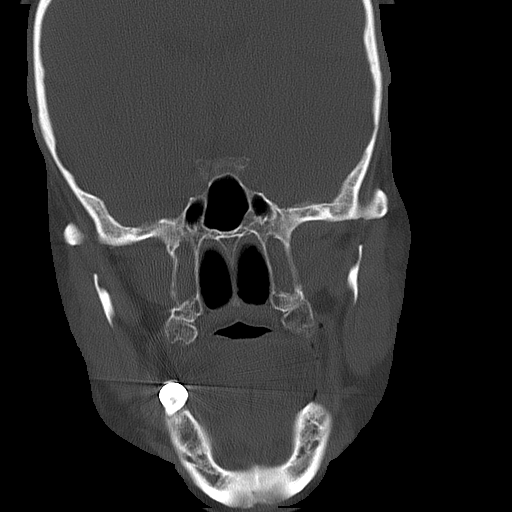
[im 9/28  brain]
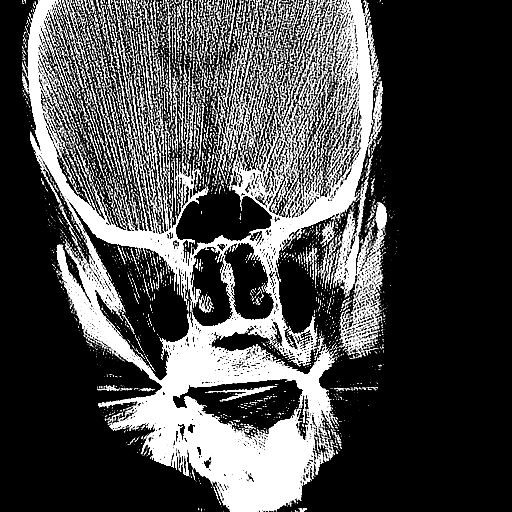
[im 9/28  bone]
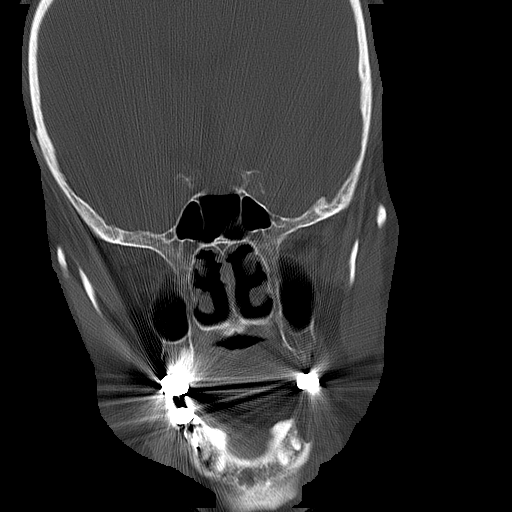
[im 10/28  bone]
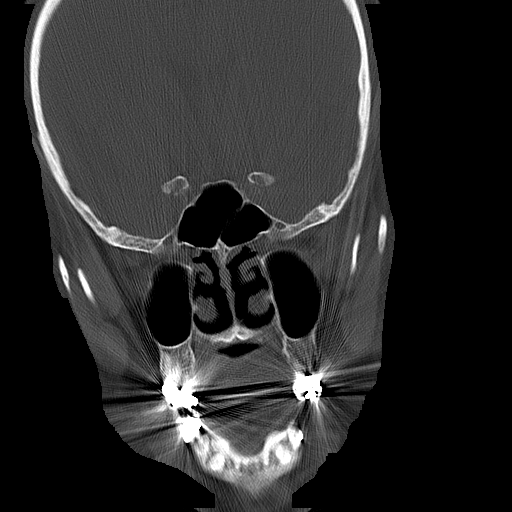
[im 12/28  bone]
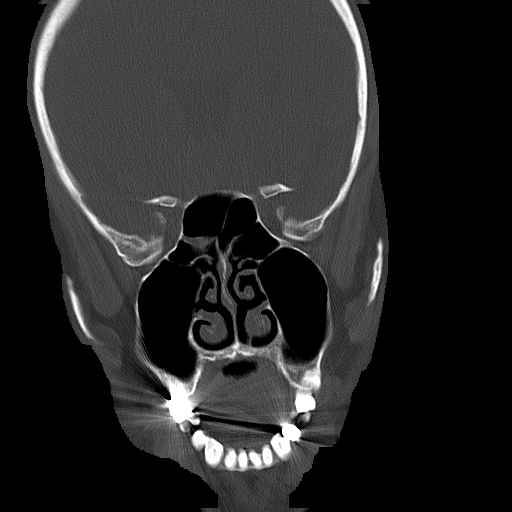
[im 14/28  bone]
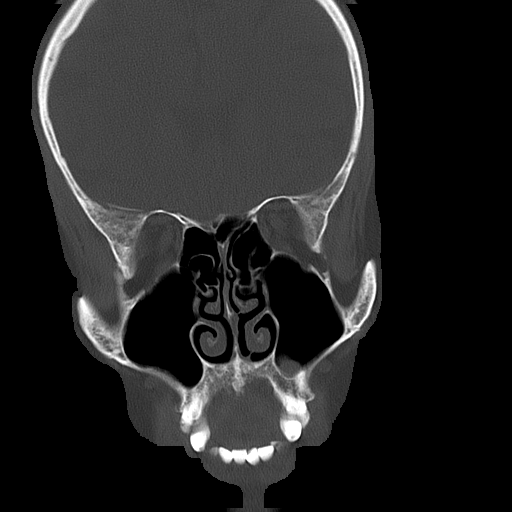
[im 15/28  brain]
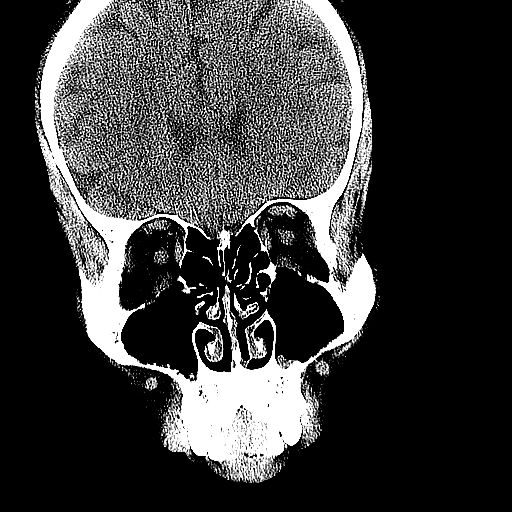
[im 15/28  bone]
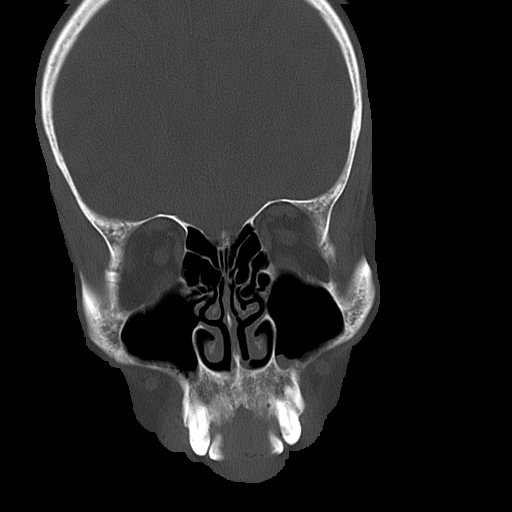
[im 17/28  bone]
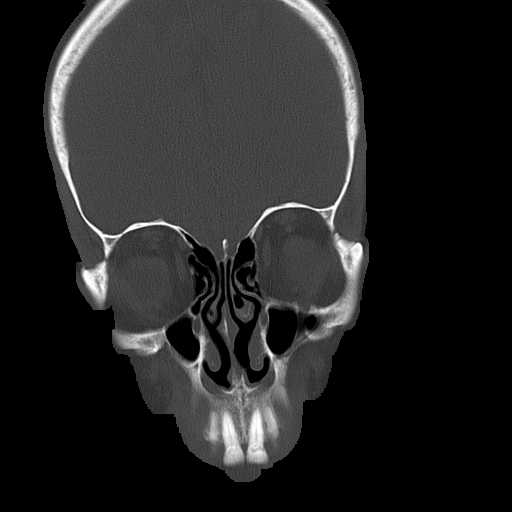
[im 19/28  bone]
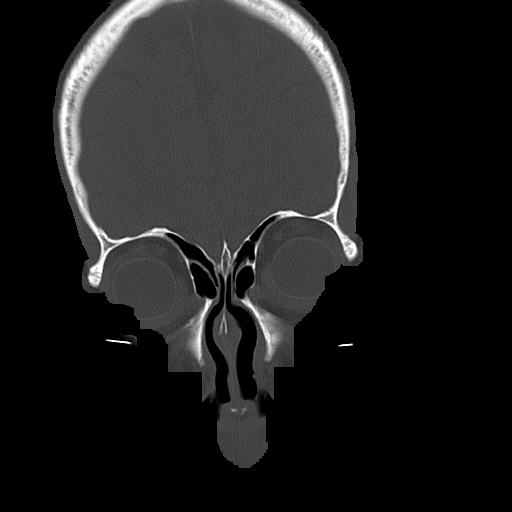
[im 20/28  bone]
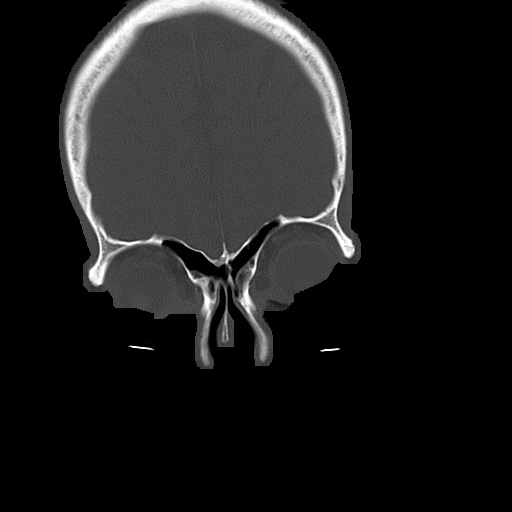
[im 22/28  brain]
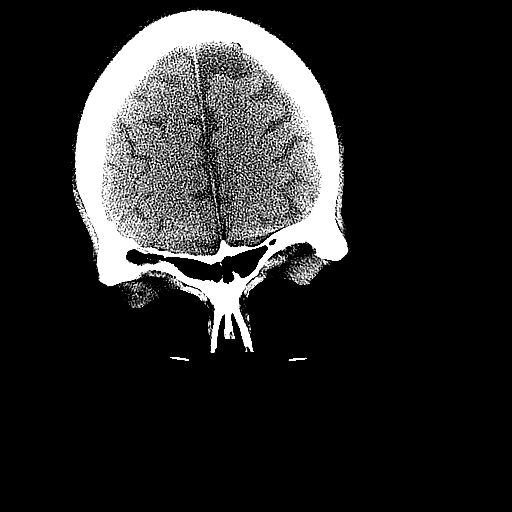
[im 22/28  bone]
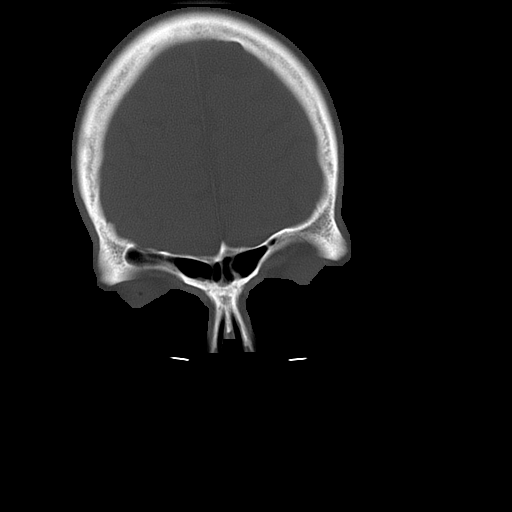
[im 23/28  bone]
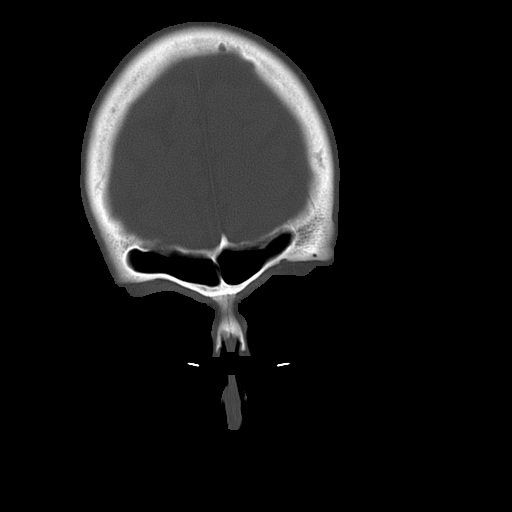
[im 25/28  bone]
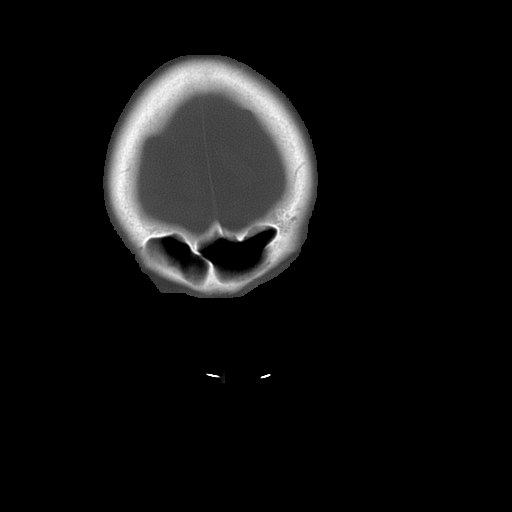
[im 27/28  bone]
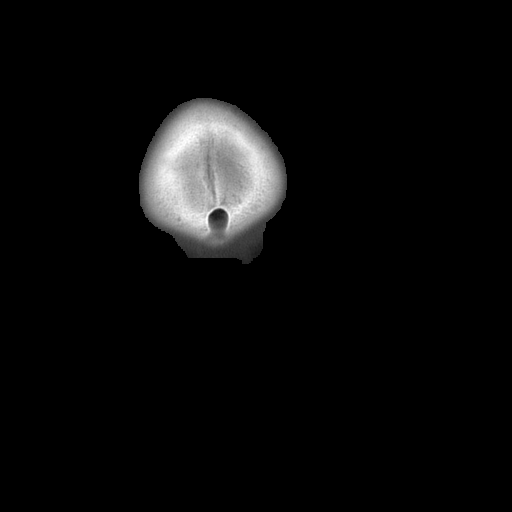

[16 of 28 positions shown; findings below may reference images not displayed]

PROCEDURE:     CT  - CT SINUSES WITHOUT CONTRAST  - [DATE]  [DATE]

RESULT:     There is a mucous retention cyst at the floor of the LEFT
maxillary sinus.  The nasal septum is normally aligned.  The ethmoid,
frontal, and sphenoid sinuses appear normally aerated.  Again there is
normal orientation and formation of the ostiomeatal complex with no evidence
of obstruction.
IMPRESSION: Mucous retention cyst in the floor of the LEFT maxillary sinus noted
incidentally.  No CT evidence of sinusitis.

## 2005-07-14 ENCOUNTER — Ambulatory Visit: Payer: Self-pay | Admitting: Internal Medicine

## 2005-07-19 ENCOUNTER — Ambulatory Visit: Payer: Self-pay | Admitting: Internal Medicine

## 2005-07-29 IMAGING — CR DG CHEST 2V
1 series · 2 of 2 positions shown · non-contrast
Comparison: none

REASON FOR EXAM: Fever
COMMENTS:

[Series 1: view not recorded · 0.17mm/px · 2 of 2 slices shown]
[im 1/2]
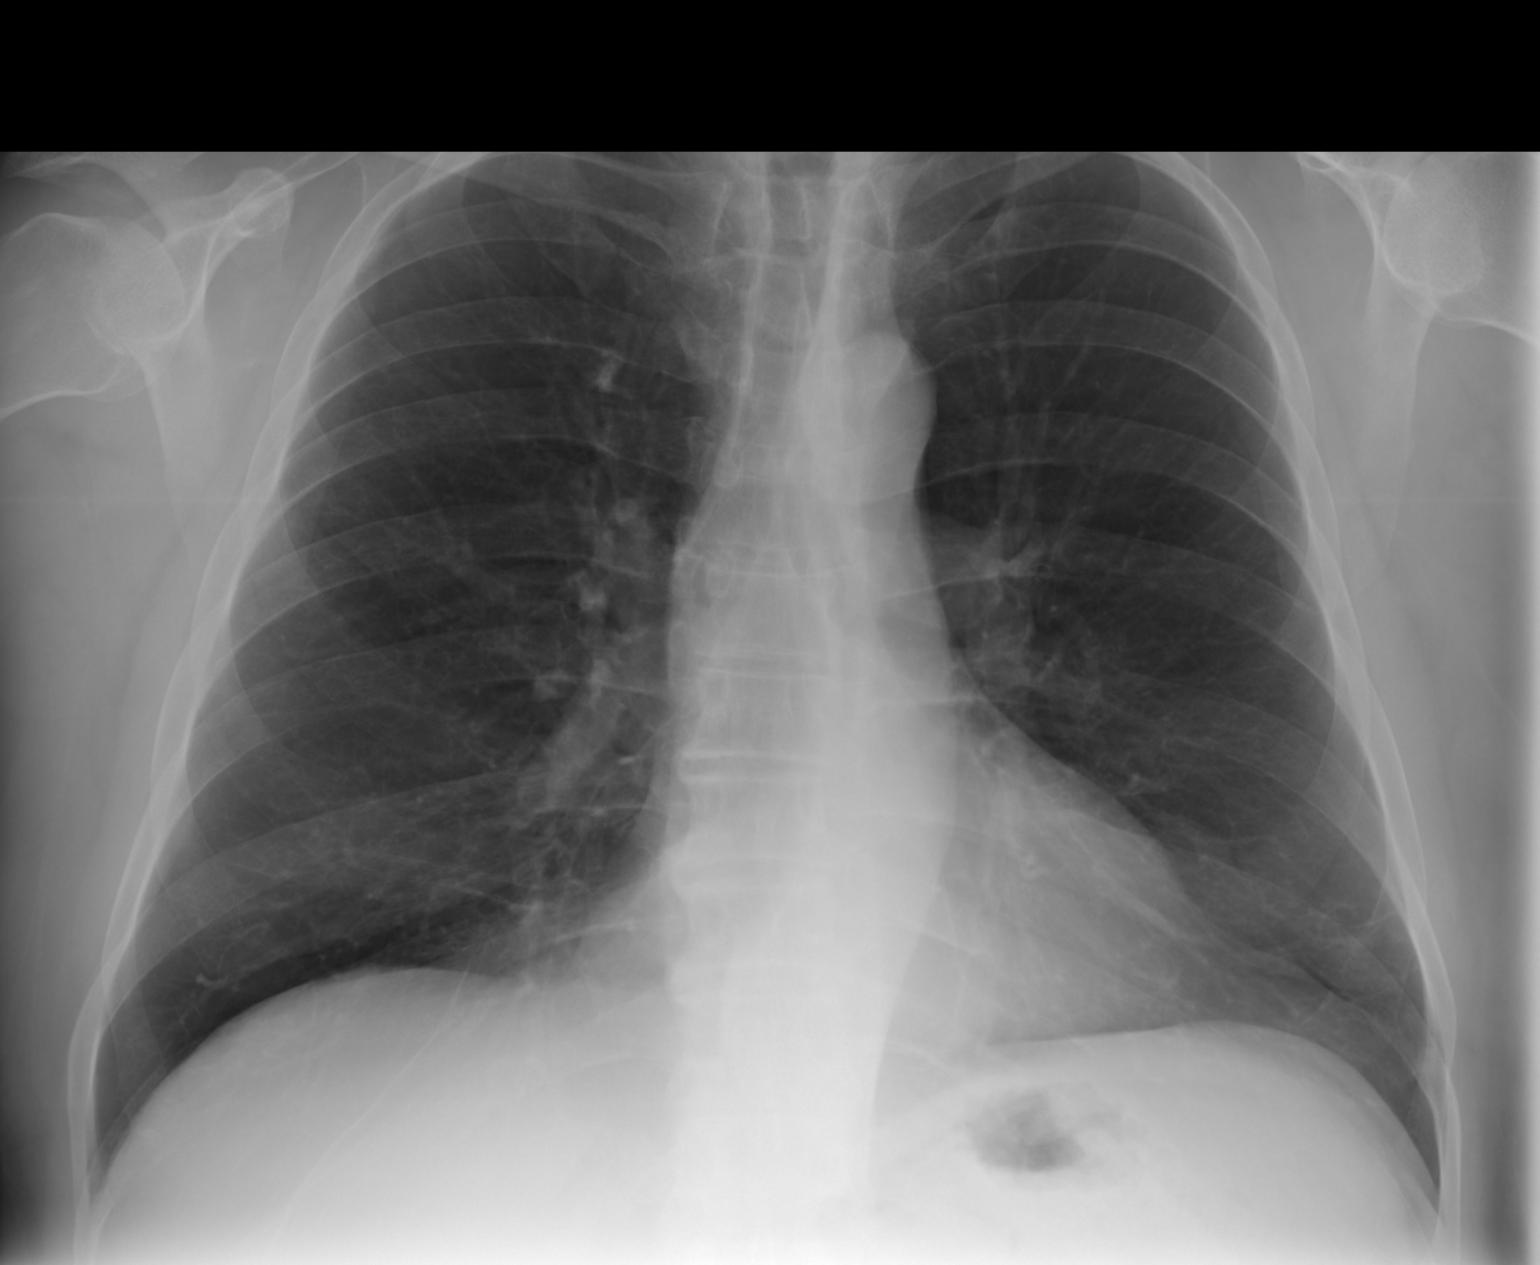
[im 2/2]
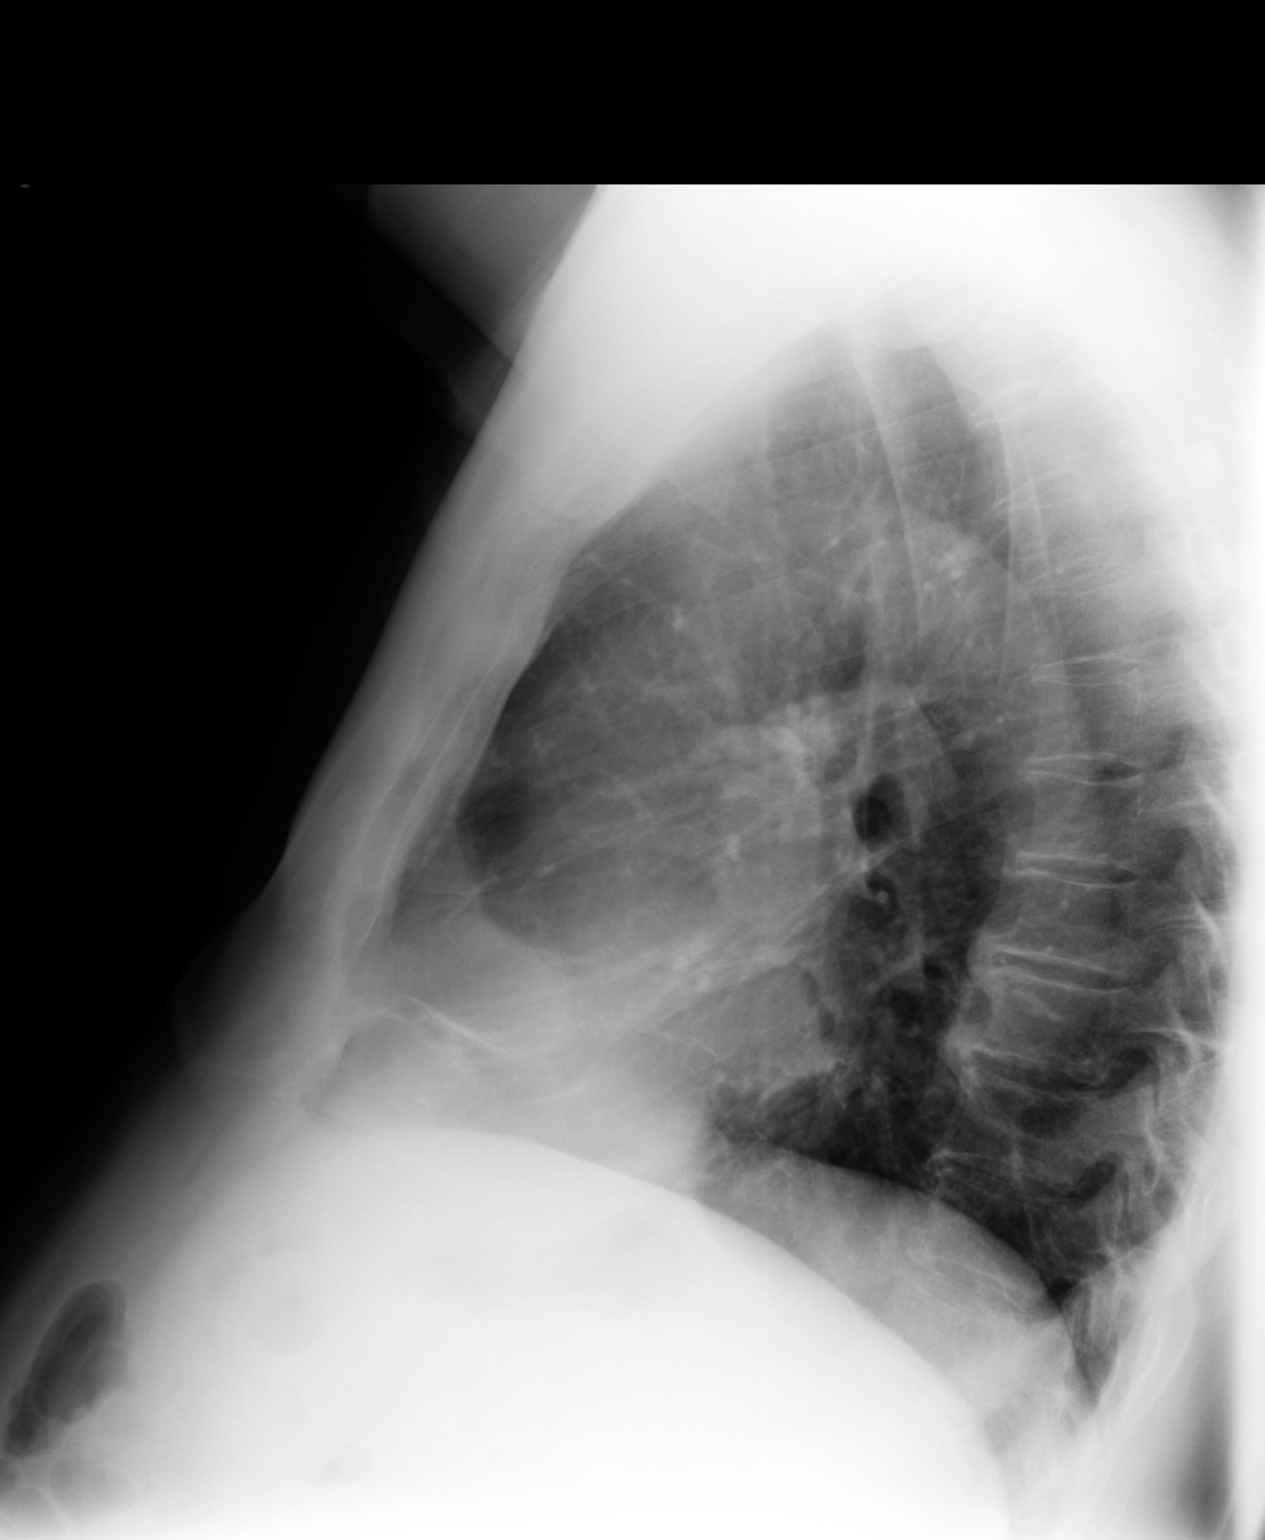

[2 of 2 positions shown; findings below may reference images not displayed]

PROCEDURE:     DXR - DXR CHEST PA (OR AP) AND LATERAL  - [DATE]  [DATE]

RESULT:        Comparison is made to a study [DATE].

The lungs are mildly hyperinflated but clear.  The heart is not enlarged.
There is tortuosity of the descending thoracic aorta.  There is no pleural
effusion.  The mediastinum is not widened.
IMPRESSION: I do not see evidence of pneumonia.  I cannot exclude an
element of COPD or acute bronchitis.   Follow-up films are recommended if
the patient's symptoms persist.

## 2005-08-07 ENCOUNTER — Ambulatory Visit: Payer: Self-pay

## 2005-08-07 IMAGING — CT CT PELVIS W/ CM
1 series · 16 of 32 positions shown, 20 images · non-contrast
Comparison: none

REASON FOR EXAM: Prostatitis. Patient on metformin
COMMENTS:

[Series 2: pelvis · axial · 0.74mm/px · z∈[-572,-348]mm · 16 of 32 slices shown, 20 images]
[im 3/32  soft-tissue]
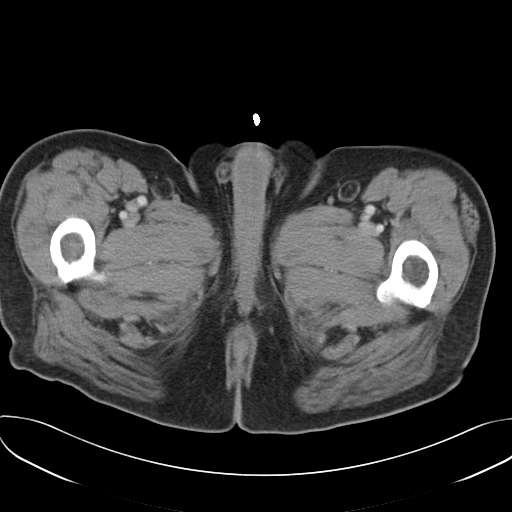
[im 3/32  bone]
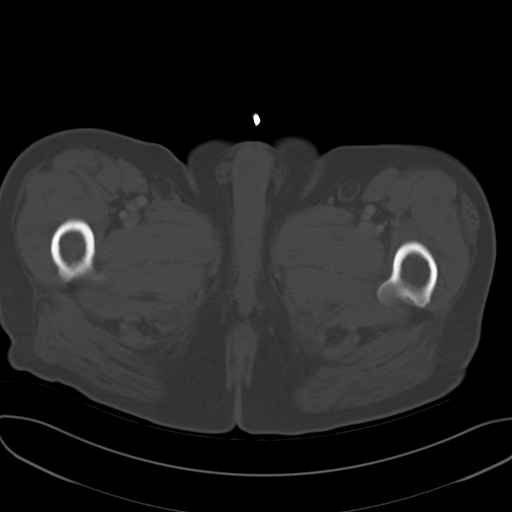
[im 5/32  soft-tissue]
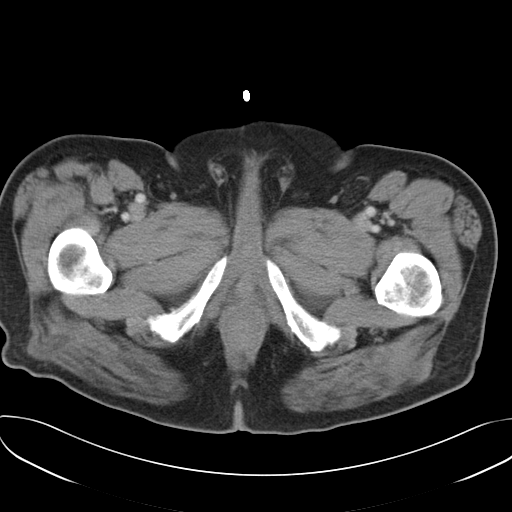
[im 7/32  soft-tissue]
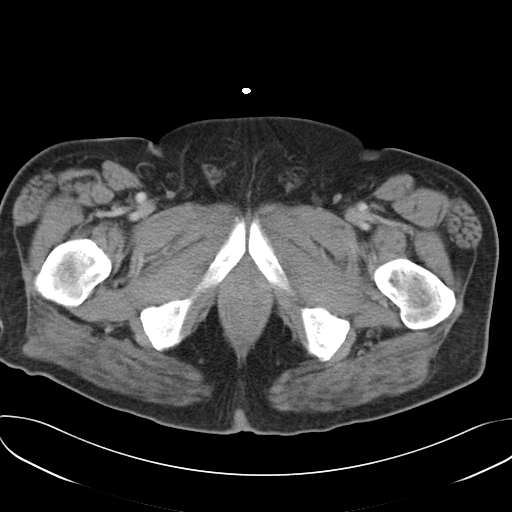
[im 9/32  soft-tissue]
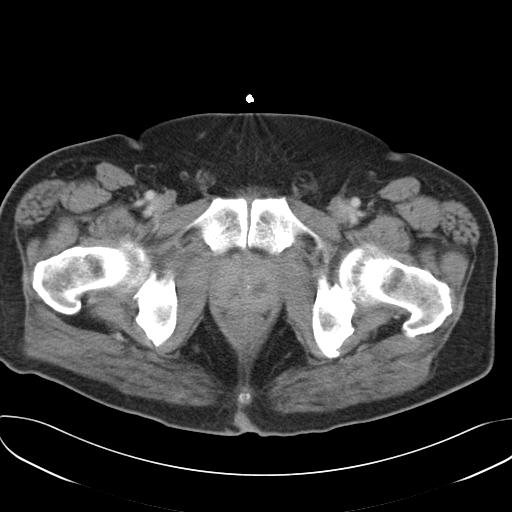
[im 11/32  soft-tissue]
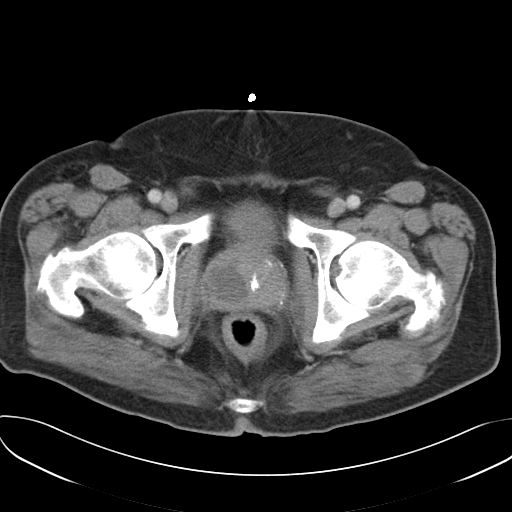
[im 13/32  soft-tissue]
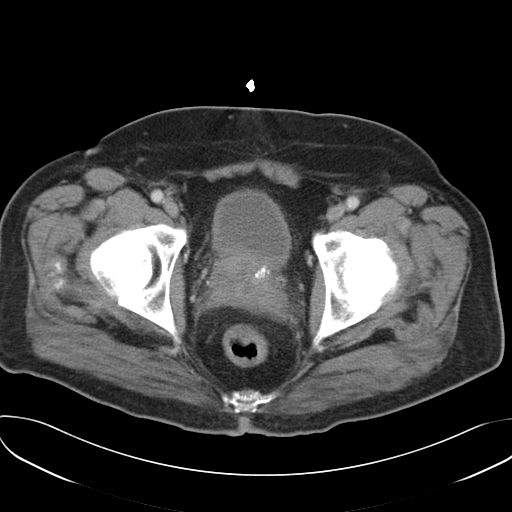
[im 15/32  soft-tissue]
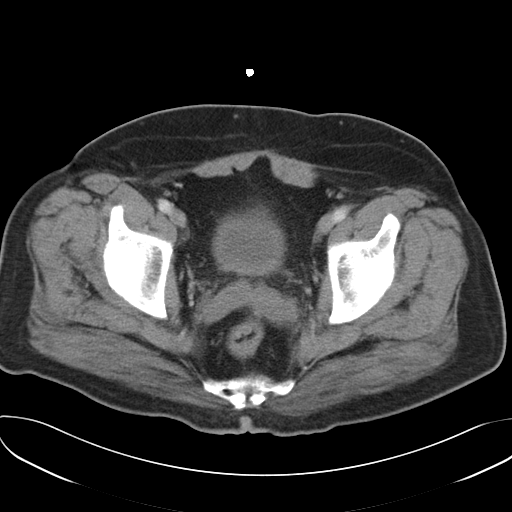
[im 18/32  soft-tissue]
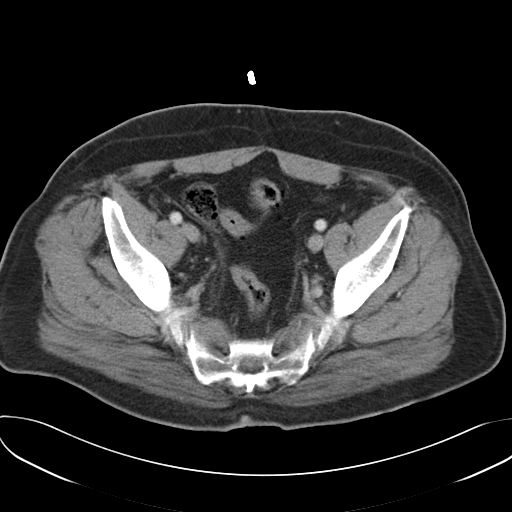
[im 20/32  soft-tissue]
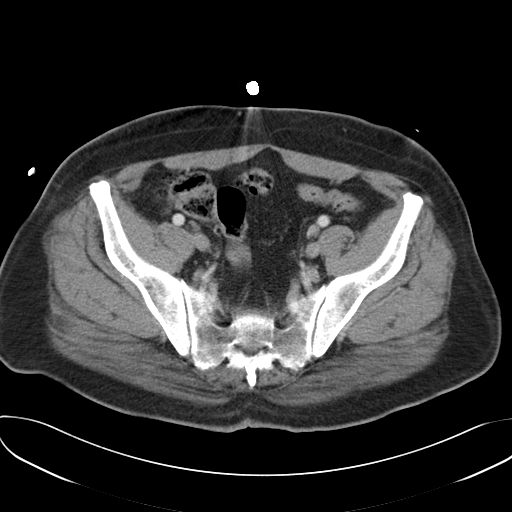
[im 20/32  bone]
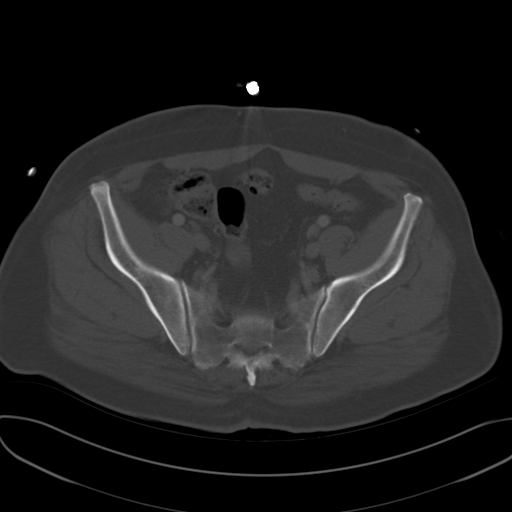
[im 22/32  soft-tissue]
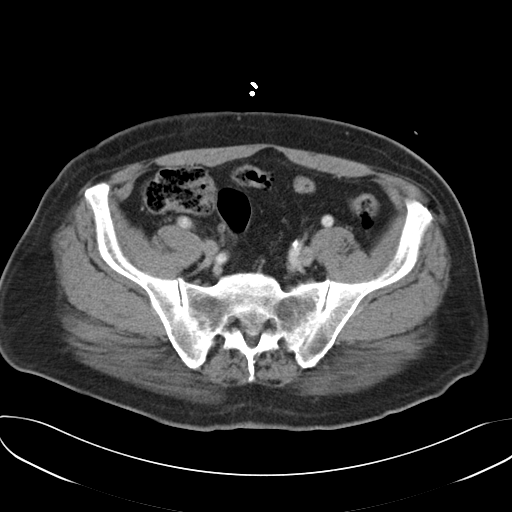
[im 24/32  soft-tissue]
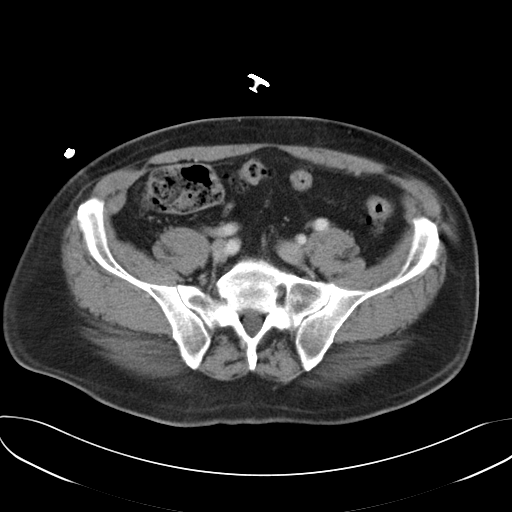
[im 26/32  soft-tissue]
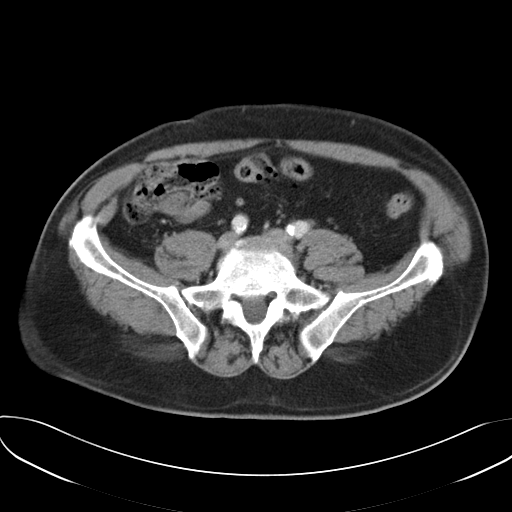
[im 28/32  soft-tissue]
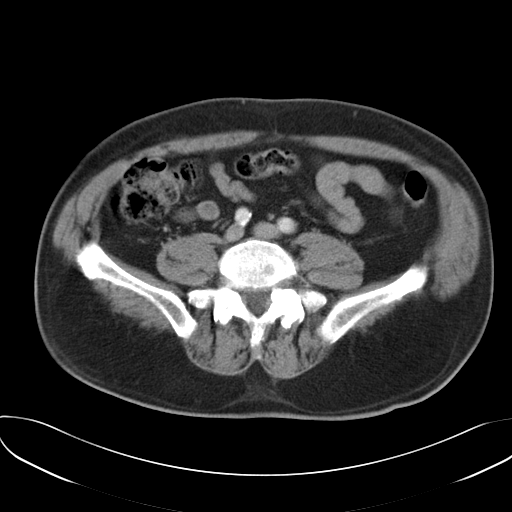
[im 28/32  lung]
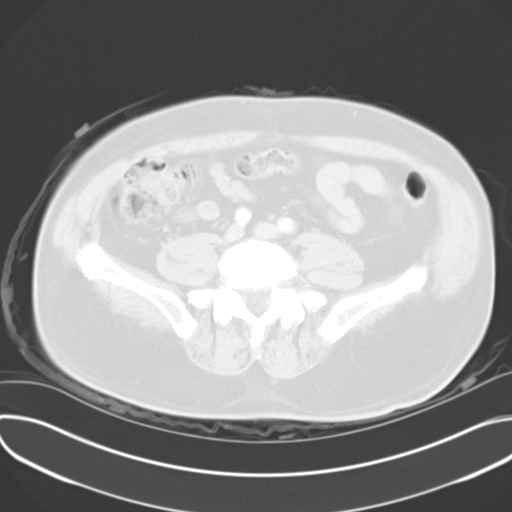
[im 29/32  lung]
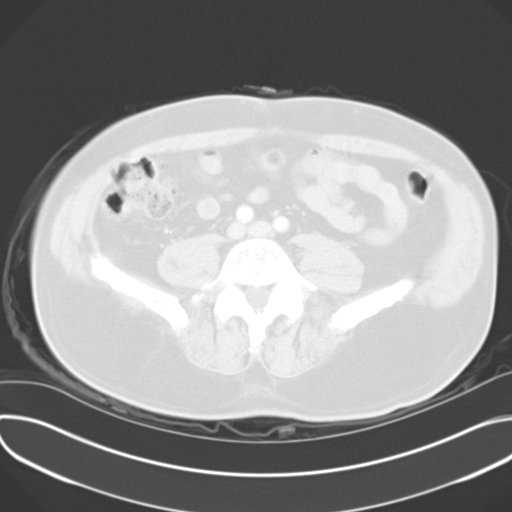
[im 30/32  soft-tissue]
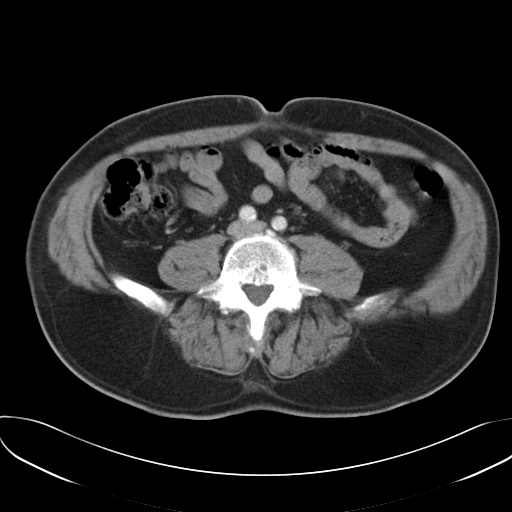
[im 30/32  lung]
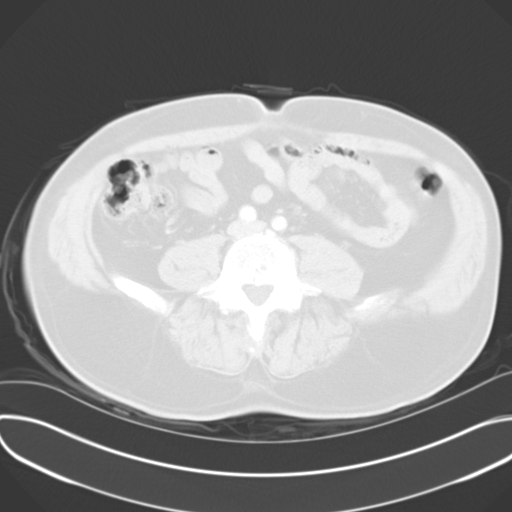
[im 31/32  lung]
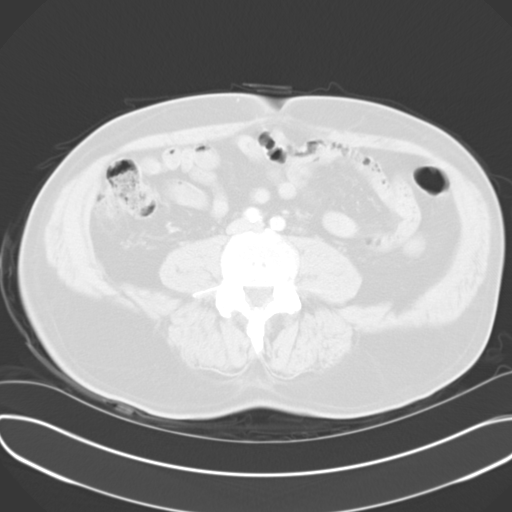

[16 of 32 positions shown; findings below may reference images not displayed]

PROCEDURE:     CT  - CT PELVIS STANDARD W  - [DATE]  [DATE]

RESULT:        IV contrast enhanced CT scan of the pelvis is performed.  The
prostate is enlarged and contains some areas of coarse calcification.  There
is an area of low density to the RIGHT of midline showing a Hounsfield
reading of 20 which could represent a small prostatic abscess showing a
diameter of approximately 2.7 cm.  Scattered sigmoid diverticula are
present.  There is no pericolonic abscess.  No abnormal bowel distention is
present.
IMPRESSION: 1.     Atherosclerotic calcification in the aorta.
2.     Enlarged prostate with a heterogenous attenuation pattern with some
areas of calcification and some fairly well circumscribed low density to the
RIGHT of midline measuring approximately 2.7 cm worrisome for a small
prostatic abscess.   Clinical correlation and follow up is recommended.

## 2005-08-19 ENCOUNTER — Ambulatory Visit: Payer: Self-pay | Admitting: Internal Medicine

## 2005-09-19 ENCOUNTER — Ambulatory Visit: Payer: Self-pay | Admitting: Internal Medicine

## 2005-10-17 ENCOUNTER — Ambulatory Visit: Payer: Self-pay | Admitting: Internal Medicine

## 2005-11-17 ENCOUNTER — Ambulatory Visit: Payer: Self-pay | Admitting: Internal Medicine

## 2005-12-17 ENCOUNTER — Ambulatory Visit: Payer: Self-pay | Admitting: Internal Medicine

## 2006-01-17 ENCOUNTER — Ambulatory Visit: Payer: Self-pay | Admitting: Internal Medicine

## 2006-02-16 ENCOUNTER — Ambulatory Visit: Payer: Self-pay | Admitting: Internal Medicine

## 2006-03-19 ENCOUNTER — Ambulatory Visit: Payer: Self-pay | Admitting: Internal Medicine

## 2006-04-19 ENCOUNTER — Ambulatory Visit: Payer: Self-pay | Admitting: Internal Medicine

## 2006-05-19 ENCOUNTER — Ambulatory Visit: Payer: Self-pay | Admitting: Internal Medicine

## 2006-05-19 IMAGING — CR DG CHEST 1V PORT
1 series · 1 of 1 positions shown · non-contrast
Comparison: none

REASON FOR EXAM: Fever
COMMENTS:

[view not recorded]
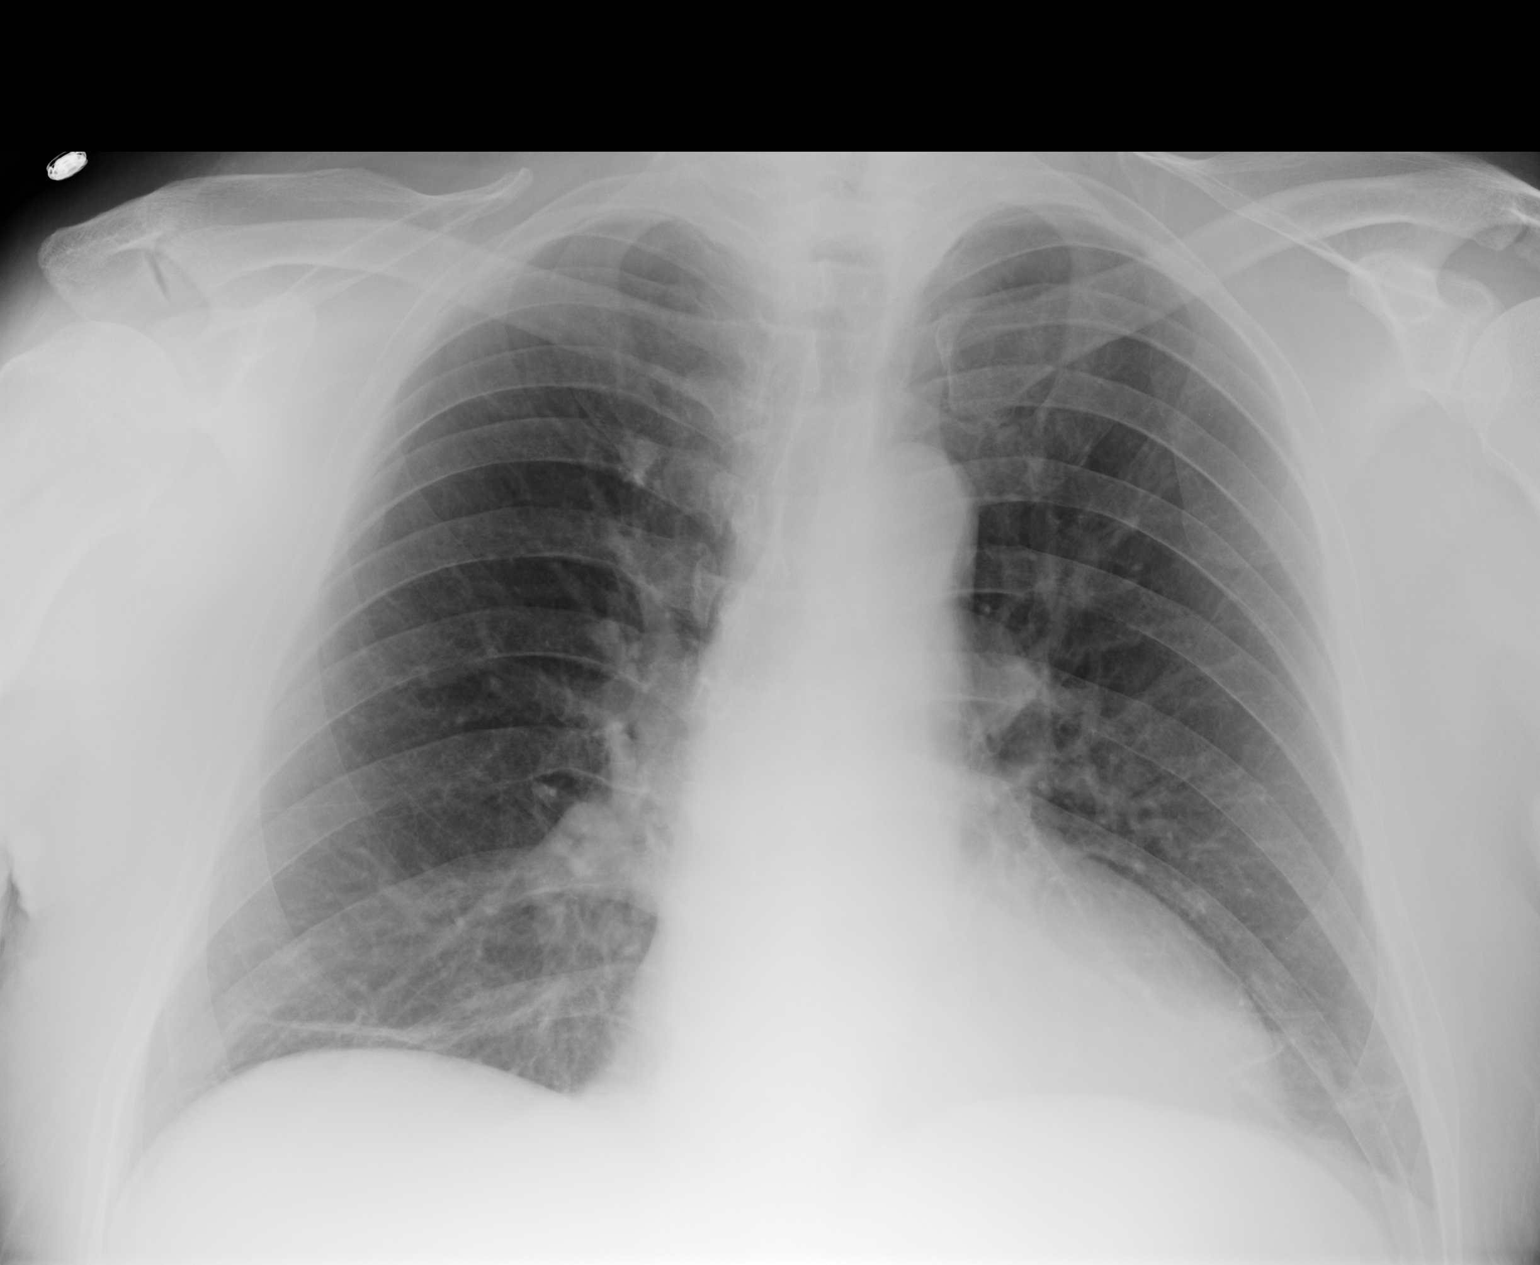

[1 of 1 positions shown; findings below may reference images not displayed]

PROCEDURE:     DXR - DXR PORTABLE CHEST SINGLE VIEW  - [DATE]  [DATE]

RESULT:          AP view of the chest shows a linear density at the RIGHT
base compatible with discoid atelectasis or fibrosis.  No pneumonia,
pneumothorax, or pleural effusion is seen.  The heart size is within normal
limits for portable AP technique.
IMPRESSION: 1.     No acute changes are identified.
2.     There is slight atelectasis at the RIGHT base.

## 2006-05-25 IMAGING — CR DG CHEST 1V PORT
1 series · 1 of 1 positions shown · non-contrast
Comparison: none

REASON FOR EXAM: Abnormal clinical exam, neutropenia
COMMENTS:

[view not recorded]
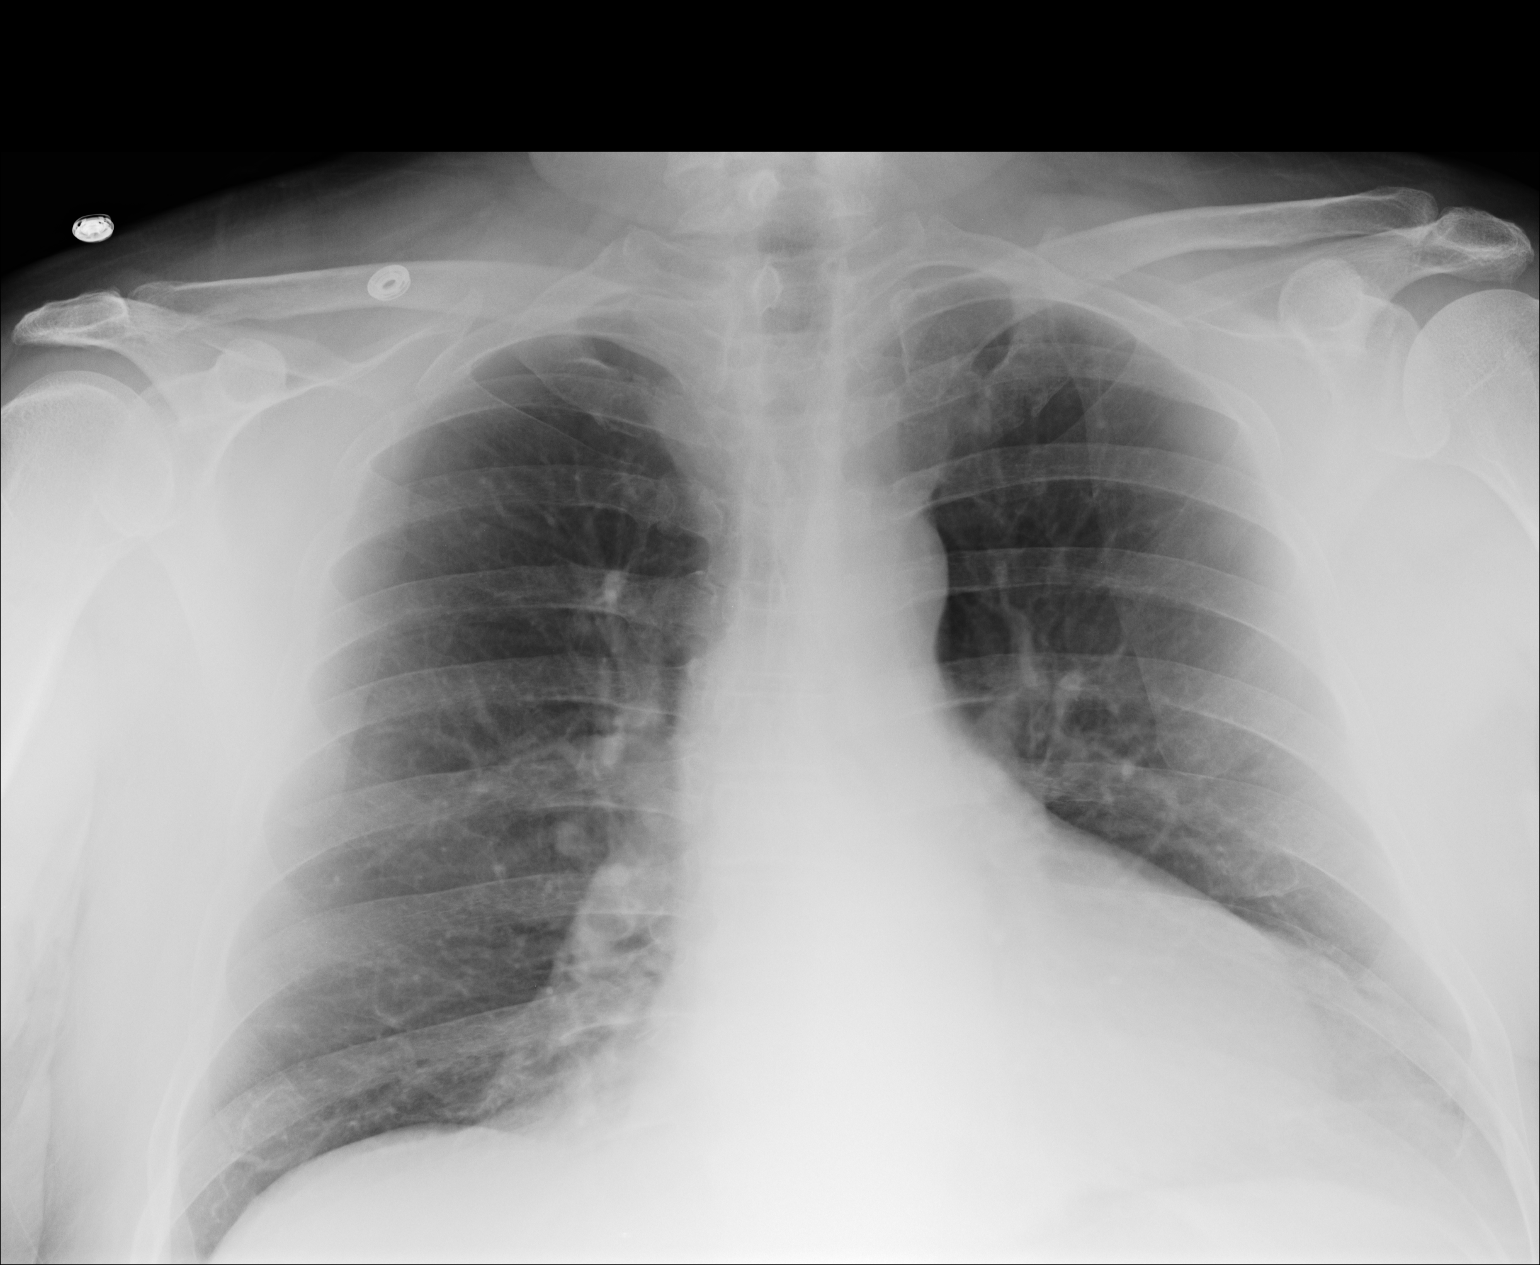

[1 of 1 positions shown; findings below may reference images not displayed]

PROCEDURE:     DXR - DXR PORTABLE CHEST SINGLE VIEW  - [DATE]  [DATE]

RESULT:     Comparison is made to the study of [DATE].  The heart is
enlarged. There is no infiltrate or effusion. The retrocardiac region of the
LEFT lung base is poorly seen. There is no evidence of failure. There is no
pneumothorax.
IMPRESSION: 1)No definite acute cardiopulmonary disease.

2)Stable cardiomegaly.

## 2006-06-19 ENCOUNTER — Ambulatory Visit: Payer: Self-pay | Admitting: Internal Medicine

## 2006-08-15 ENCOUNTER — Ambulatory Visit: Payer: Self-pay | Admitting: Internal Medicine

## 2006-08-19 ENCOUNTER — Ambulatory Visit: Payer: Self-pay | Admitting: Internal Medicine

## 2006-10-14 ENCOUNTER — Ambulatory Visit: Payer: Self-pay | Admitting: Internal Medicine

## 2006-10-18 ENCOUNTER — Ambulatory Visit: Payer: Self-pay | Admitting: Internal Medicine

## 2006-10-27 DIAGNOSIS — D239 Other benign neoplasm of skin, unspecified: Secondary | ICD-10-CM

## 2006-10-27 HISTORY — DX: Other benign neoplasm of skin, unspecified: D23.9

## 2006-12-12 ENCOUNTER — Ambulatory Visit: Payer: Self-pay | Admitting: Internal Medicine

## 2006-12-18 ENCOUNTER — Ambulatory Visit: Payer: Self-pay | Admitting: Internal Medicine

## 2007-03-20 ENCOUNTER — Ambulatory Visit: Payer: Self-pay | Admitting: Internal Medicine

## 2007-04-03 ENCOUNTER — Ambulatory Visit: Payer: Self-pay | Admitting: Internal Medicine

## 2007-04-20 ENCOUNTER — Ambulatory Visit: Payer: Self-pay | Admitting: Internal Medicine

## 2007-05-20 ENCOUNTER — Ambulatory Visit: Payer: Self-pay | Admitting: Internal Medicine

## 2007-06-01 ENCOUNTER — Ambulatory Visit: Payer: Self-pay | Admitting: Internal Medicine

## 2007-06-01 IMAGING — CT CT CHEST-ABD-PELV W/ CM
2 of 4 series · 13 of 29 positions shown, 19 images · non-contrast
Comparison: none

REASON FOR EXAM: Pleurasy and fever Poss PE  Call Report  [[id number]]
COMMENTS:

PROCEDURE:     CT  - CT CHEST ABDOMEN AND PELVIS W  - [DATE]  [DATE]
RESULT:
HISTORY: Fever.

[Series 4: soft tissue · axial · 0.91mm/px · z∈[-588,-522]mm · 3 of 101 slices shown]
[im 12/101  mediastinal]
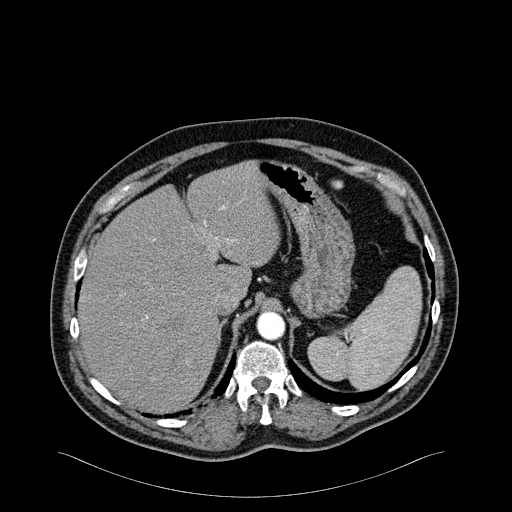
[im 23/101  mediastinal]
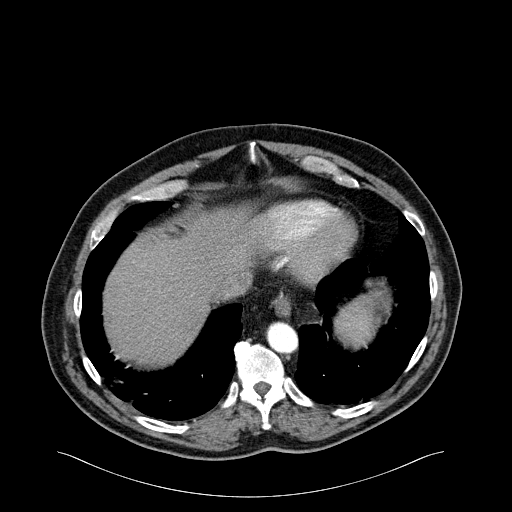
[im 34/101  mediastinal]
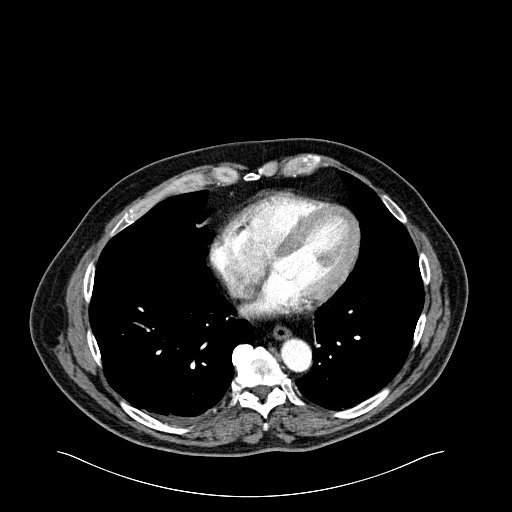

[Series 6: abdomen · axial · 0.93mm/px · z∈[-988,-574]mm · 10 of 105 slices shown, 16 images]
[im 11/105  mediastinal]
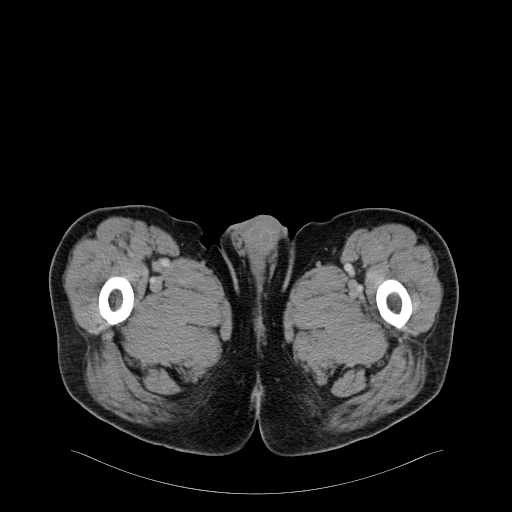
[im 11/105  bone]
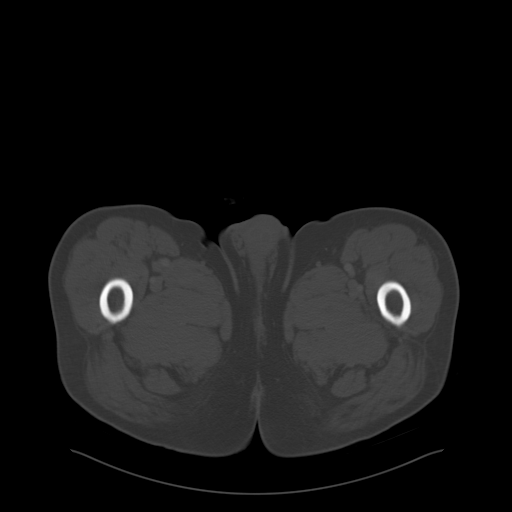
[im 21/105  mediastinal]
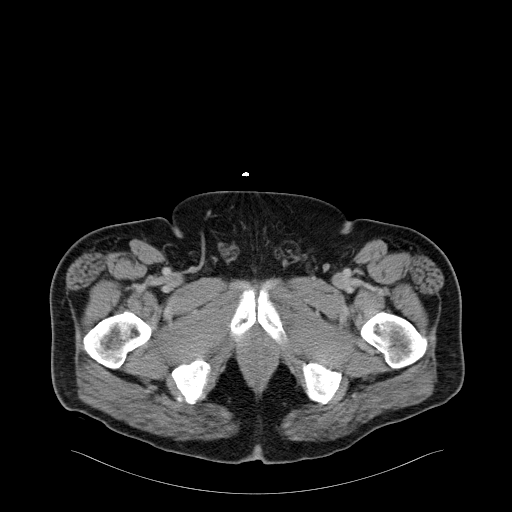
[im 32/105  mediastinal]
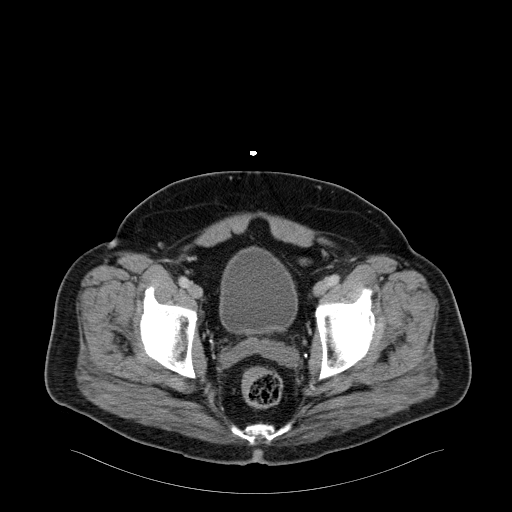
[im 42/105  mediastinal]
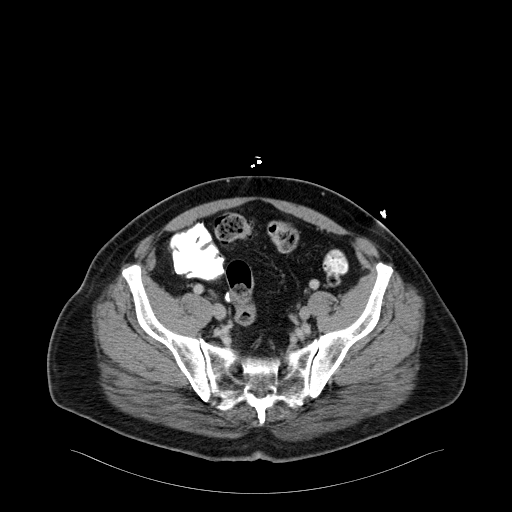
[im 53/105  mediastinal]
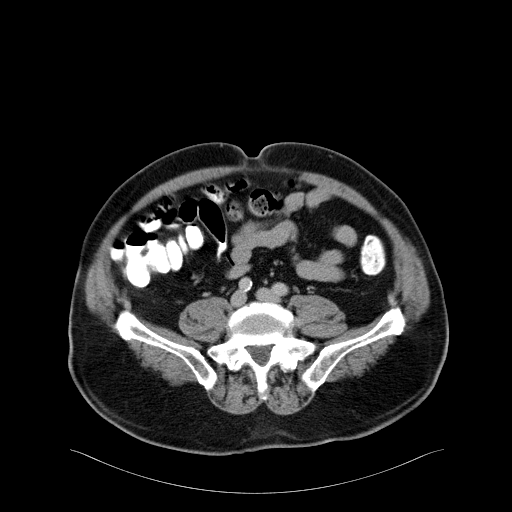
[im 63/105  mediastinal]
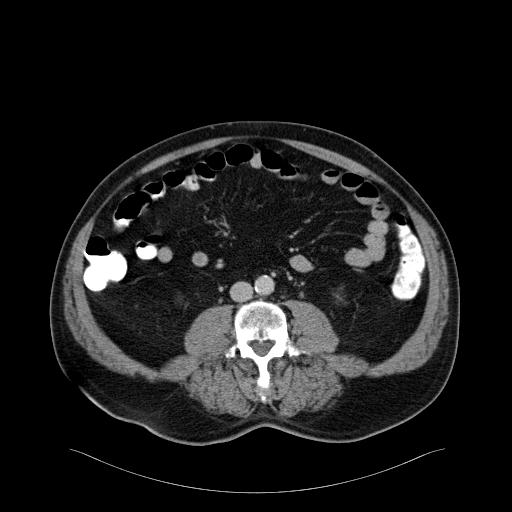
[im 71/105  mediastinal]
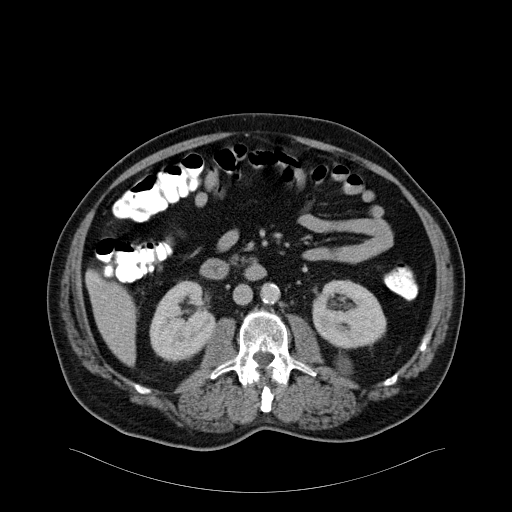
[im 71/105  lung]
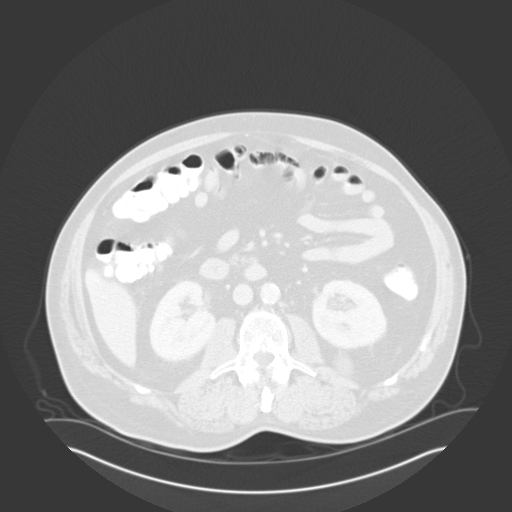
[im 73/105  mediastinal]
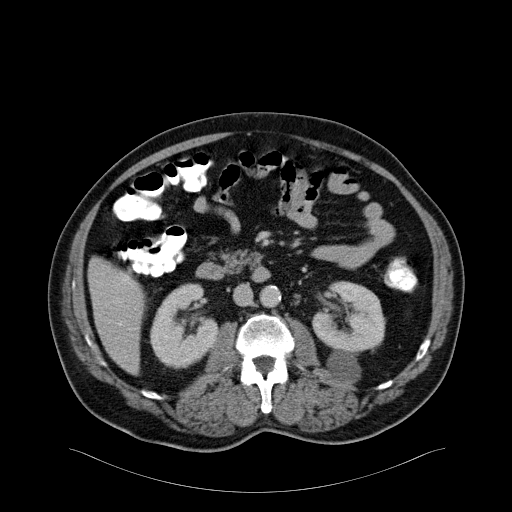
[im 73/105  lung]
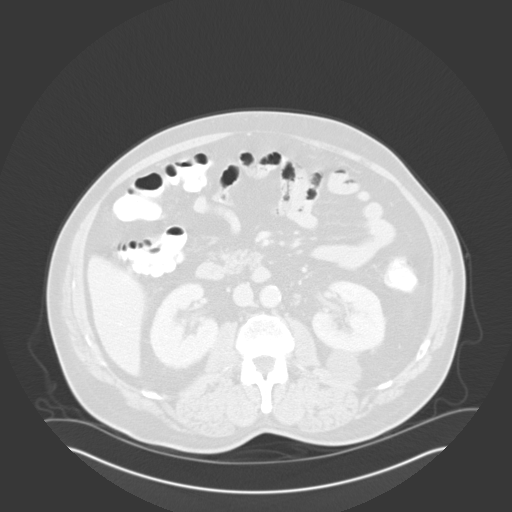
[im 84/105  mediastinal]
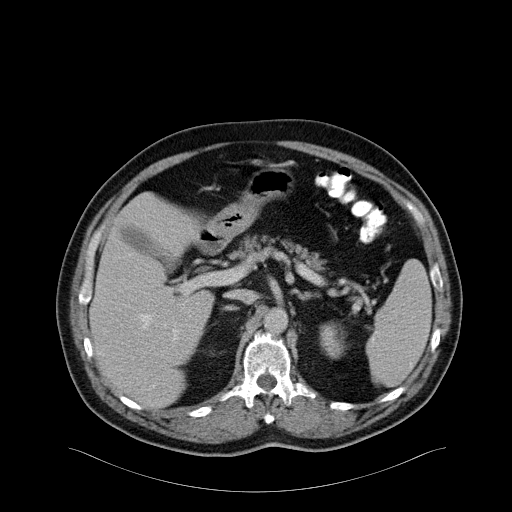
[im 84/105  lung]
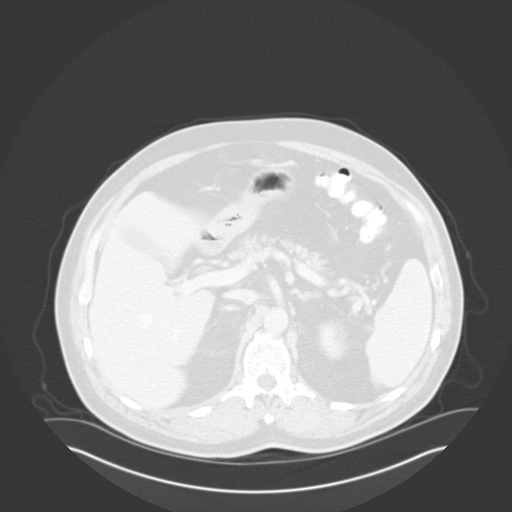
[im 84/105  bone]
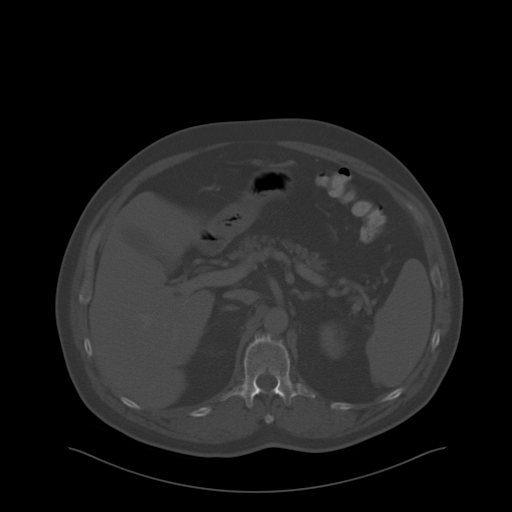
[im 94/105  mediastinal]
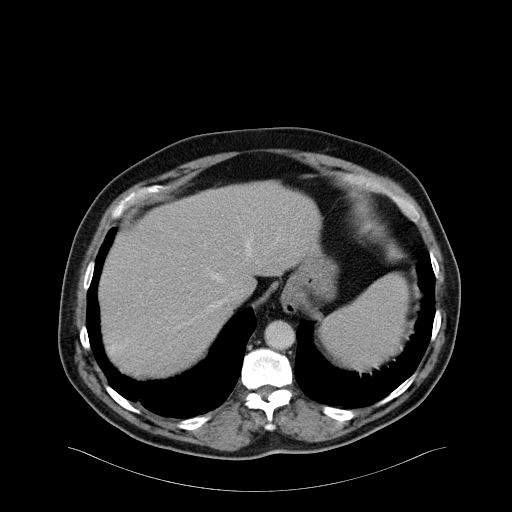
[im 94/105  lung]
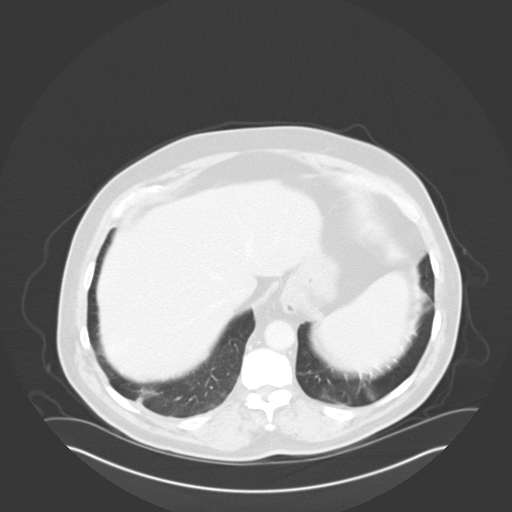

[13 of 29 positions shown; findings below may reference images not displayed]

COMPARISON STUDIES: No recent.

PROCEDURE AND FINDINGS: IV contrast enhanced Chest CT was obtained.  The
thoracic aorta is normal.  The pulmonary arteries are normal.  Peripheral
infiltrate on the RIGHT is noted.  There is associated pleural thickening.
These changes are most consistent with pneumonia. Mild basilar atelectasis
is present.  Follow-up Chest CT is suggested to demonstrate clearing to
exclude underlying tumor.
IMPRESSION: 1)Peripheral RIGHT lung infiltrate with pleural thickening.  Pneumonia
cannot be excluded.

2)No evidence of pulmonary embolus.

3)Follow-up chest CT is recommended to demonstrate clearing of the pulmonary
infiltrate on the RIGHT.  This needs to be performed to exclude an
underlying mass lesion.

## 2007-06-20 ENCOUNTER — Ambulatory Visit: Payer: Self-pay | Admitting: Internal Medicine

## 2007-07-20 ENCOUNTER — Ambulatory Visit: Payer: Self-pay | Admitting: Internal Medicine

## 2007-08-21 ENCOUNTER — Ambulatory Visit: Payer: Self-pay | Admitting: Internal Medicine

## 2007-09-20 ENCOUNTER — Ambulatory Visit: Payer: Self-pay | Admitting: Internal Medicine

## 2007-10-13 IMAGING — CR DG CHEST 2V
1 series · 3 of 3 positions shown · non-contrast
Comparison: none

REASON FOR EXAM: Pneumonia
COMMENTS:

PROCEDURE:     DXR - DXR CHEST PA (OR AP) AND LATERAL  - [DATE]  [DATE]
RESULT:     The lungs are well expanded. There is no focal infiltrate. The
heart is normal in size. The pulmonary vascularity is not engorged. There is
mild tortuosity of the descending thoracic aorta.

[Series 1: view not recorded · 0.17mm/px · 3 of 3 slices shown]
[im 1/3]
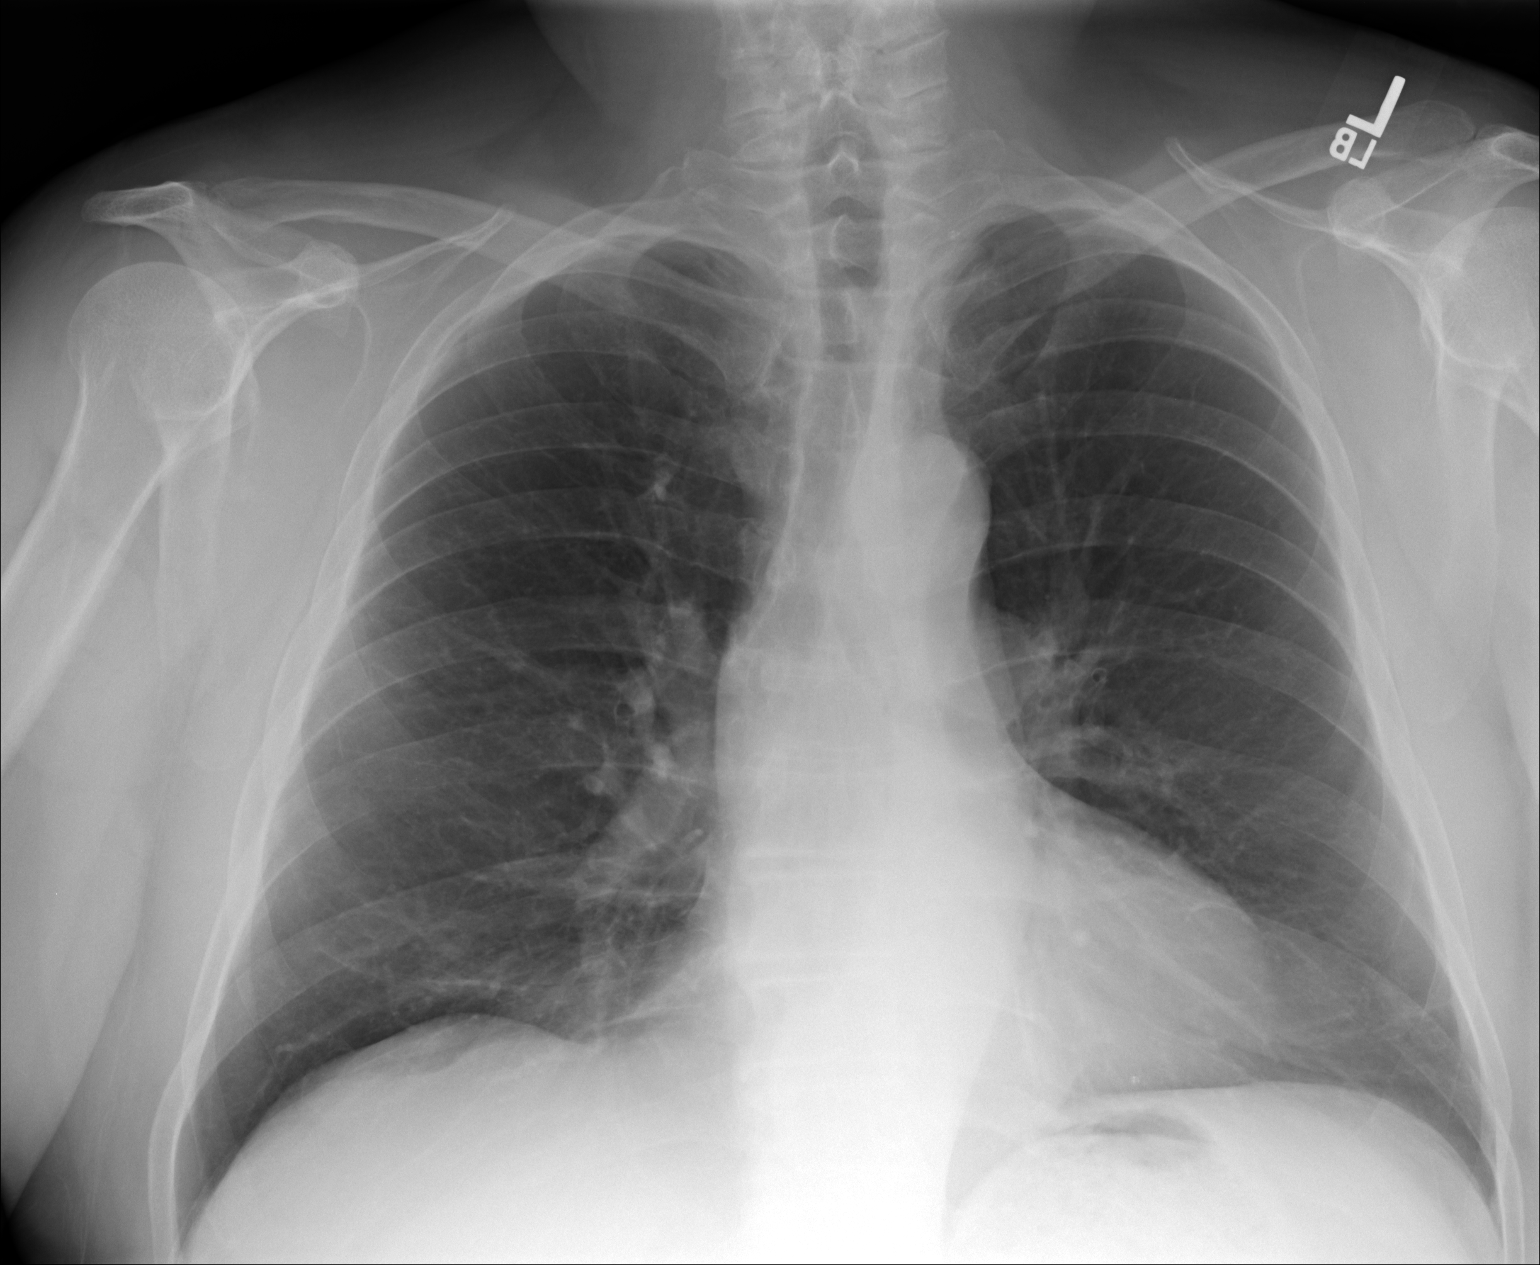
[im 2/3]
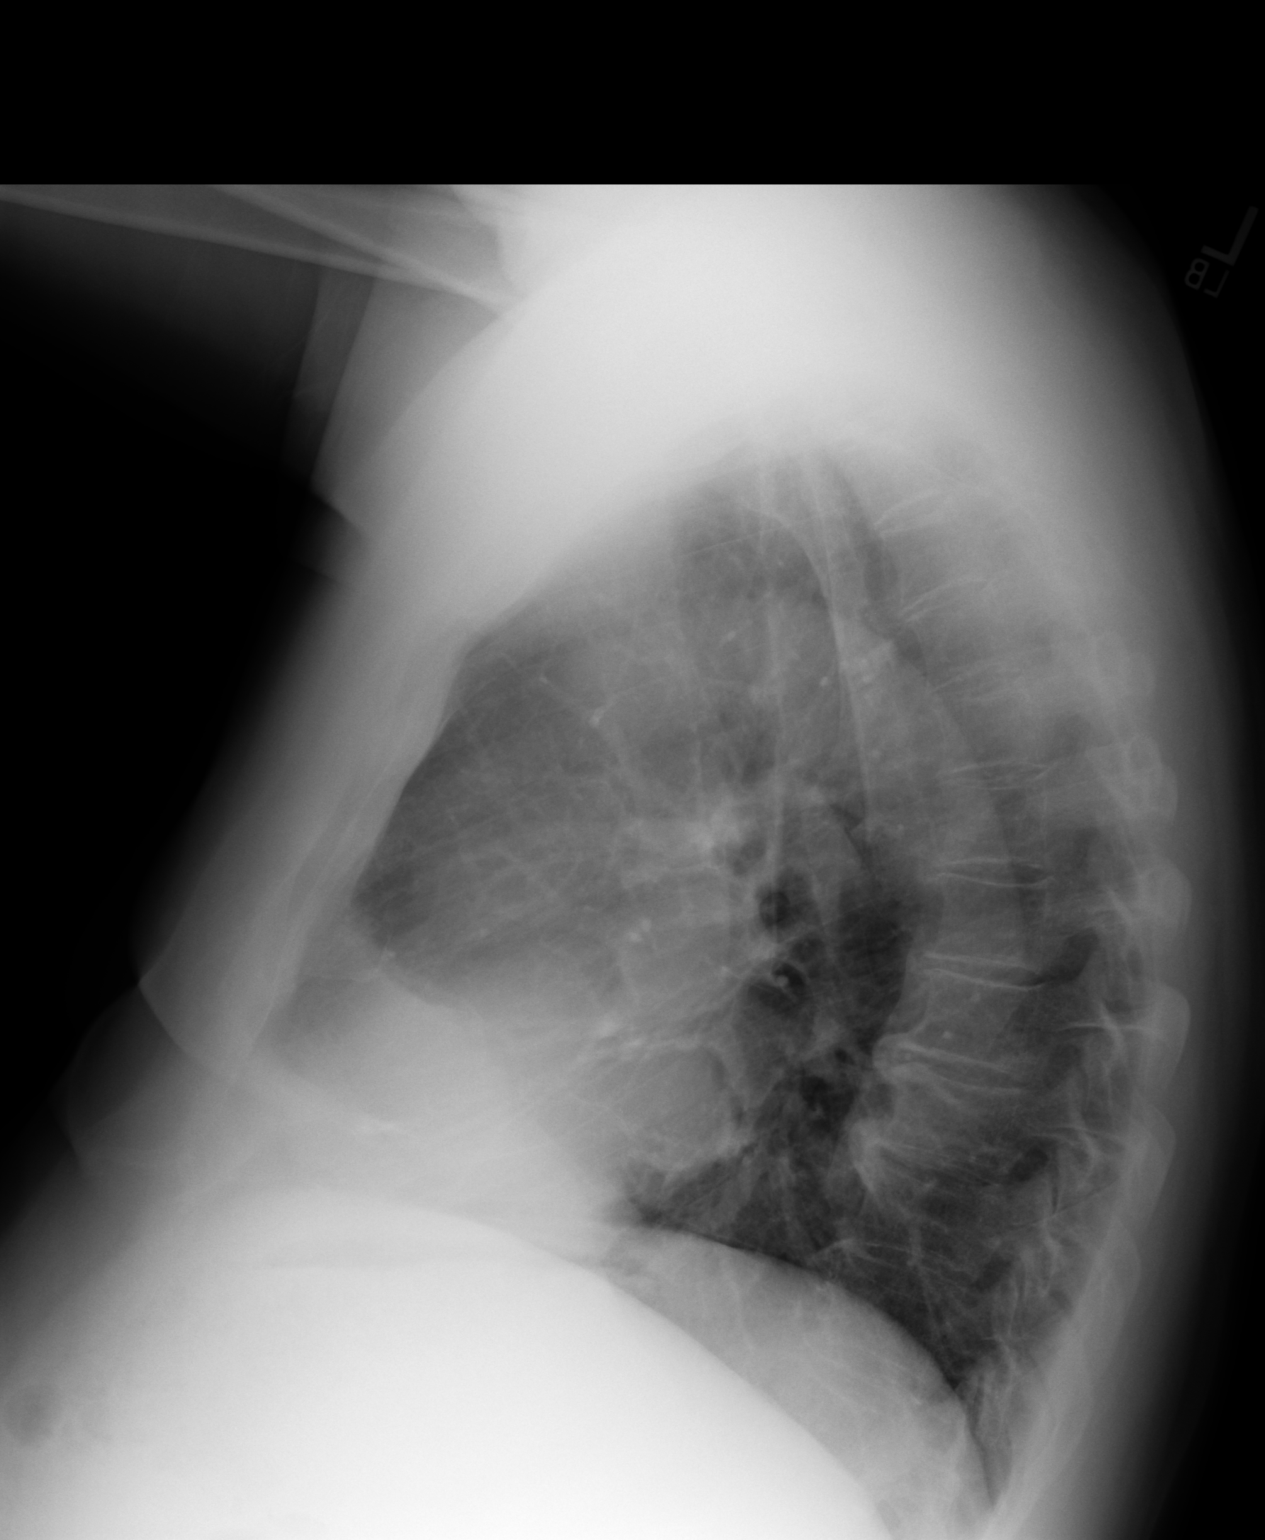
[im 3/3]
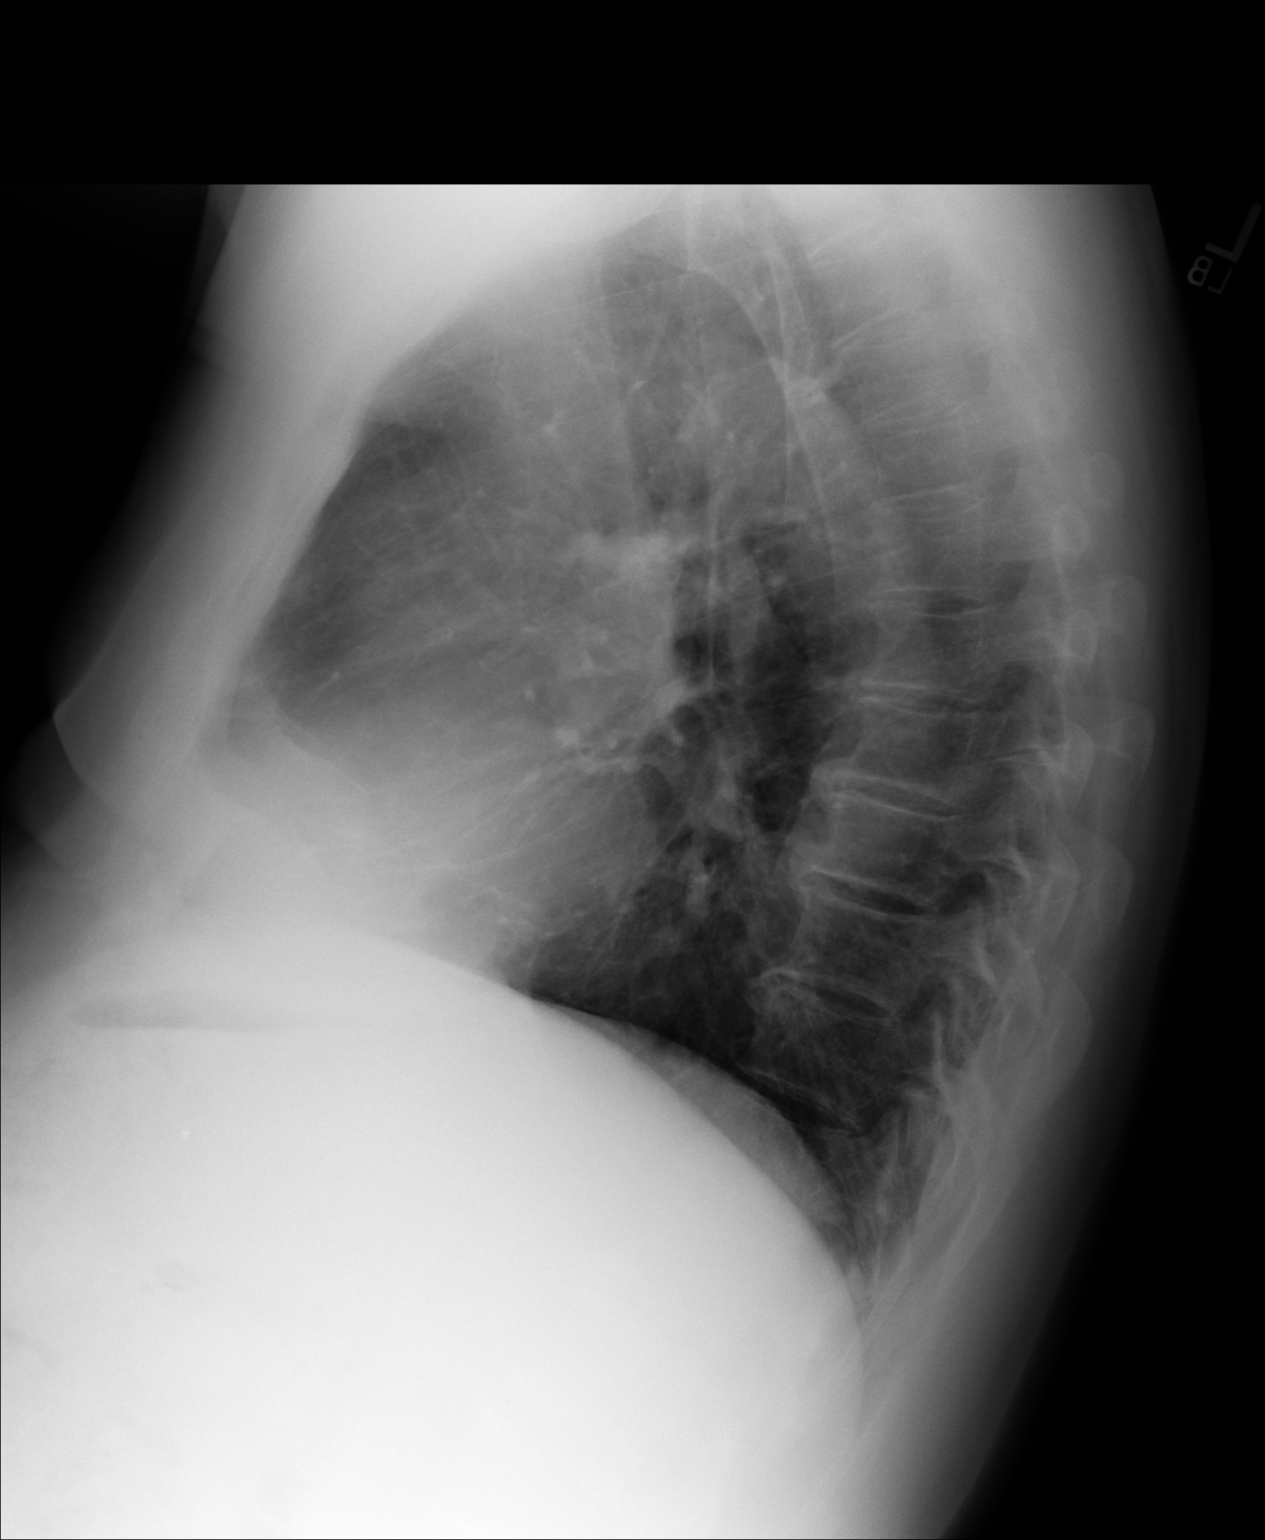

[3 of 3 positions shown; findings below may reference images not displayed]

IMPRESSION: I do not see evidence of pneumonia or definite evidence of
other acute cardiopulmonary abnormality. If the patient has persistent
symptoms, further evaluation with chest CT scanning would be of value. There
is density in the retrosternal region on the lateral film that is felt to be
related to the cardiac fat pad.

## 2007-10-18 ENCOUNTER — Ambulatory Visit: Payer: Self-pay | Admitting: Internal Medicine

## 2007-11-18 ENCOUNTER — Ambulatory Visit: Payer: Self-pay | Admitting: Oncology

## 2007-12-07 ENCOUNTER — Ambulatory Visit: Payer: Self-pay | Admitting: Internal Medicine

## 2007-12-18 ENCOUNTER — Ambulatory Visit: Payer: Self-pay | Admitting: Internal Medicine

## 2008-01-18 ENCOUNTER — Ambulatory Visit: Payer: Self-pay | Admitting: Internal Medicine

## 2008-02-01 ENCOUNTER — Ambulatory Visit: Payer: Self-pay | Admitting: Internal Medicine

## 2008-02-17 ENCOUNTER — Ambulatory Visit: Payer: Self-pay | Admitting: Internal Medicine

## 2008-04-01 ENCOUNTER — Ambulatory Visit: Payer: Self-pay | Admitting: Internal Medicine

## 2008-04-19 ENCOUNTER — Ambulatory Visit: Payer: Self-pay | Admitting: Internal Medicine

## 2008-05-19 ENCOUNTER — Ambulatory Visit: Payer: Self-pay | Admitting: Internal Medicine

## 2008-06-02 ENCOUNTER — Ambulatory Visit: Payer: Self-pay | Admitting: Internal Medicine

## 2008-06-09 ENCOUNTER — Ambulatory Visit: Payer: Self-pay | Admitting: Unknown Physician Specialty

## 2008-06-19 ENCOUNTER — Ambulatory Visit: Payer: Self-pay | Admitting: Internal Medicine

## 2008-07-19 ENCOUNTER — Ambulatory Visit: Payer: Self-pay | Admitting: Internal Medicine

## 2008-09-19 ENCOUNTER — Ambulatory Visit: Payer: Self-pay | Admitting: Internal Medicine

## 2008-09-20 ENCOUNTER — Ambulatory Visit: Payer: Self-pay | Admitting: Internal Medicine

## 2008-10-17 ENCOUNTER — Ambulatory Visit: Payer: Self-pay | Admitting: Internal Medicine

## 2008-11-17 ENCOUNTER — Ambulatory Visit: Payer: Self-pay | Admitting: Internal Medicine

## 2008-12-17 ENCOUNTER — Ambulatory Visit: Payer: Self-pay | Admitting: Internal Medicine

## 2009-01-10 ENCOUNTER — Ambulatory Visit: Payer: Self-pay | Admitting: Internal Medicine

## 2009-01-17 ENCOUNTER — Ambulatory Visit: Payer: Self-pay | Admitting: Internal Medicine

## 2009-03-07 ENCOUNTER — Ambulatory Visit: Payer: Self-pay | Admitting: Internal Medicine

## 2009-03-19 ENCOUNTER — Ambulatory Visit: Payer: Self-pay | Admitting: Internal Medicine

## 2009-04-19 ENCOUNTER — Ambulatory Visit: Payer: Self-pay | Admitting: Internal Medicine

## 2009-05-02 ENCOUNTER — Ambulatory Visit: Payer: Self-pay | Admitting: Internal Medicine

## 2009-05-19 ENCOUNTER — Ambulatory Visit: Payer: Self-pay | Admitting: Internal Medicine

## 2009-06-30 ENCOUNTER — Ambulatory Visit: Payer: Self-pay | Admitting: Internal Medicine

## 2009-07-19 ENCOUNTER — Ambulatory Visit: Payer: Self-pay | Admitting: Internal Medicine

## 2009-08-19 ENCOUNTER — Ambulatory Visit: Payer: Self-pay | Admitting: Internal Medicine

## 2009-08-30 ENCOUNTER — Ambulatory Visit: Payer: Self-pay | Admitting: Internal Medicine

## 2009-09-19 ENCOUNTER — Ambulatory Visit: Payer: Self-pay | Admitting: Internal Medicine

## 2009-10-17 ENCOUNTER — Ambulatory Visit: Payer: Self-pay | Admitting: Internal Medicine

## 2009-11-17 ENCOUNTER — Ambulatory Visit: Payer: Self-pay | Admitting: Internal Medicine

## 2009-12-17 ENCOUNTER — Ambulatory Visit: Payer: Self-pay | Admitting: Internal Medicine

## 2009-12-26 ENCOUNTER — Ambulatory Visit: Payer: Self-pay | Admitting: Internal Medicine

## 2010-01-17 ENCOUNTER — Ambulatory Visit: Payer: Self-pay | Admitting: Internal Medicine

## 2010-02-05 ENCOUNTER — Ambulatory Visit: Payer: Self-pay | Admitting: Internal Medicine

## 2010-02-16 ENCOUNTER — Ambulatory Visit: Payer: Self-pay | Admitting: Internal Medicine

## 2010-02-27 ENCOUNTER — Ambulatory Visit: Payer: Self-pay | Admitting: Internal Medicine

## 2010-03-19 ENCOUNTER — Ambulatory Visit: Payer: Self-pay | Admitting: Internal Medicine

## 2010-04-19 ENCOUNTER — Ambulatory Visit: Payer: Self-pay | Admitting: Internal Medicine

## 2010-05-02 ENCOUNTER — Ambulatory Visit: Payer: Self-pay | Admitting: Internal Medicine

## 2010-05-19 ENCOUNTER — Ambulatory Visit: Payer: Self-pay | Admitting: Internal Medicine

## 2010-06-27 ENCOUNTER — Ambulatory Visit: Payer: Self-pay | Admitting: Internal Medicine

## 2010-07-19 ENCOUNTER — Ambulatory Visit: Payer: Self-pay | Admitting: Internal Medicine

## 2010-08-27 ENCOUNTER — Ambulatory Visit: Payer: Self-pay | Admitting: Internal Medicine

## 2010-09-19 ENCOUNTER — Ambulatory Visit: Payer: Self-pay | Admitting: Internal Medicine

## 2010-10-18 ENCOUNTER — Ambulatory Visit: Payer: Self-pay | Admitting: Internal Medicine

## 2010-11-18 ENCOUNTER — Ambulatory Visit: Payer: Self-pay | Admitting: Internal Medicine

## 2011-01-03 ENCOUNTER — Ambulatory Visit: Payer: Self-pay | Admitting: Internal Medicine

## 2011-01-18 ENCOUNTER — Ambulatory Visit: Payer: Self-pay | Admitting: Internal Medicine

## 2011-05-02 ENCOUNTER — Ambulatory Visit: Payer: Self-pay | Admitting: Internal Medicine

## 2011-05-20 ENCOUNTER — Ambulatory Visit: Payer: Self-pay | Admitting: Internal Medicine

## 2011-07-04 ENCOUNTER — Ambulatory Visit: Payer: Self-pay | Admitting: Internal Medicine

## 2011-07-20 ENCOUNTER — Ambulatory Visit: Payer: Self-pay | Admitting: Internal Medicine

## 2011-08-20 HISTORY — PX: CARDIOVASCULAR STRESS TEST: SHX262

## 2011-09-05 ENCOUNTER — Ambulatory Visit: Payer: Self-pay | Admitting: Internal Medicine

## 2011-09-05 LAB — CBC CANCER CENTER
Basophil #: 0 x10 3/mm (ref 0.0–0.1)
Eosinophil #: 0.1 x10 3/mm (ref 0.0–0.7)
Eosinophil %: 1.6 %
Lymphocyte #: 1.1 x10 3/mm (ref 1.0–3.6)
MCH: 36 pg — ABNORMAL HIGH (ref 26.0–34.0)
MCHC: 34.6 g/dL (ref 32.0–36.0)
MCV: 104 fL — ABNORMAL HIGH (ref 80–100)
Monocyte #: 0.3 x10 3/mm (ref 0.0–0.7)
Neutrophil %: 62.1 %
Platelet: 184 x10 3/mm (ref 150–440)
RBC: 3.64 10*6/uL — ABNORMAL LOW (ref 4.40–5.90)
RDW: 14.1 % (ref 11.5–14.5)

## 2011-09-05 LAB — HEPATIC FUNCTION PANEL A (ARMC)
Albumin: 3.8 g/dL (ref 3.4–5.0)
Alkaline Phosphatase: 80 U/L (ref 50–136)
Bilirubin,Total: 0.4 mg/dL (ref 0.2–1.0)
SGPT (ALT): 27 U/L
Total Protein: 7.2 g/dL (ref 6.4–8.2)

## 2011-09-05 LAB — CREATININE, SERUM
Creatinine: 0.94 mg/dL (ref 0.60–1.30)
EGFR (Non-African Amer.): >60

## 2011-09-05 LAB — LACTATE DEHYDROGENASE: LDH: 187 U/L (ref 87–241)

## 2011-09-20 ENCOUNTER — Ambulatory Visit: Payer: Self-pay | Admitting: Internal Medicine

## 2011-10-15 ENCOUNTER — Ambulatory Visit: Payer: Self-pay | Admitting: Internal Medicine

## 2011-10-15 IMAGING — CT CT HEAD WITHOUT CONTRAST
2 series · 16 of 30 positions shown, 20 images · non-contrast
Comparison: none

REASON FOR EXAM: STAT CR Dr RUSSI is on Call Pager [[email or web address]_NUMBER] Blurred
vision double vision...
COMMENTS:

[Series 2: without · axial · non-contrast · 0.45mm/px · z∈[-212,-72]mm · 13 of 34 slices shown, 17 images]
[im 3/34  brain]
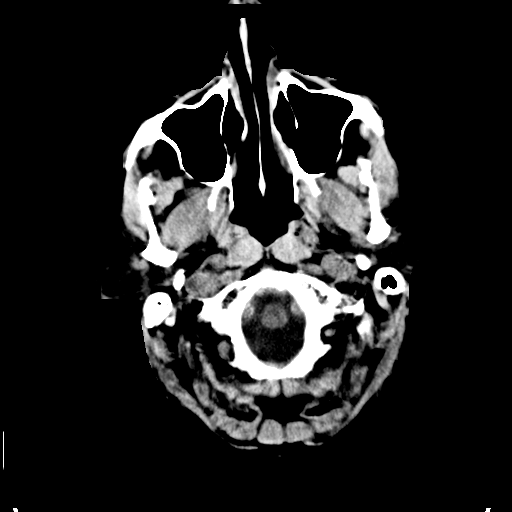
[im 3/34  bone]
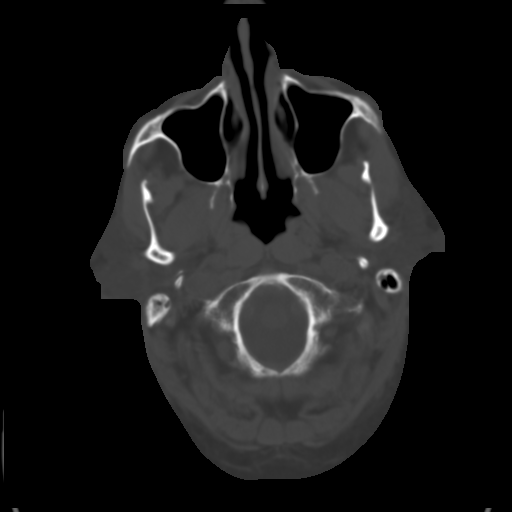
[im 5/34  brain]
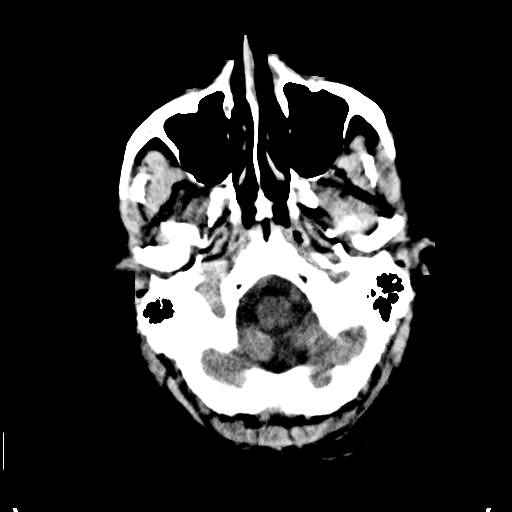
[im 8/34  brain]
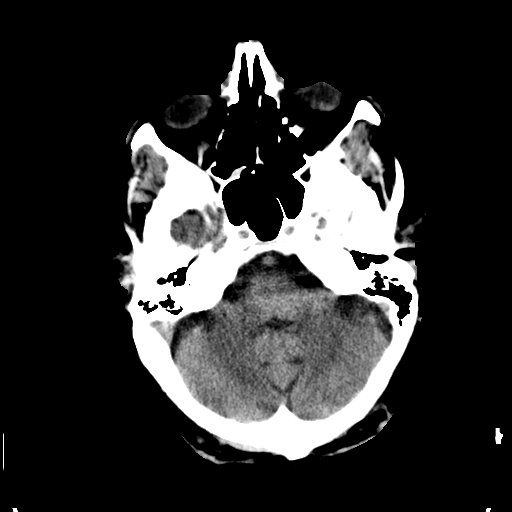
[im 10/34  brain]
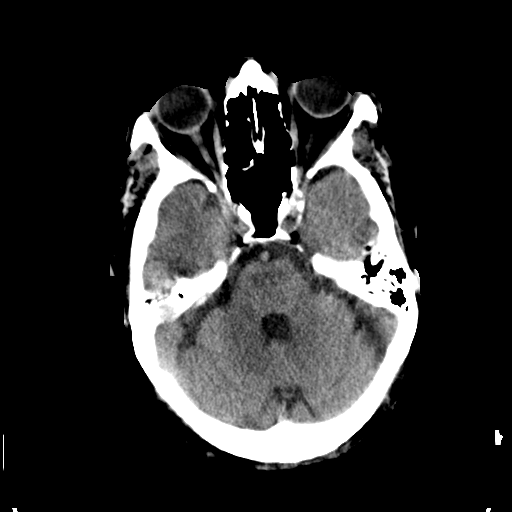
[im 12/34  brain]
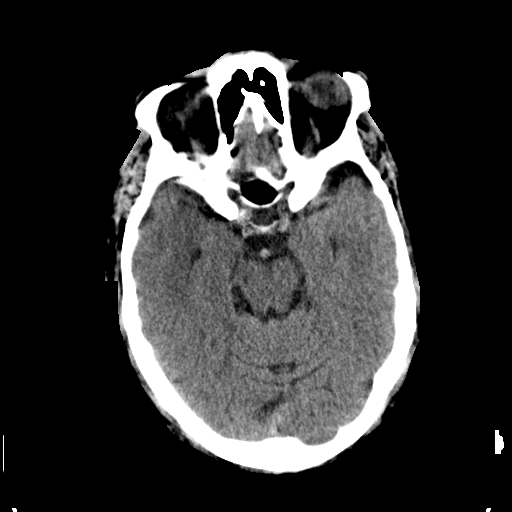
[im 12/34  bone]
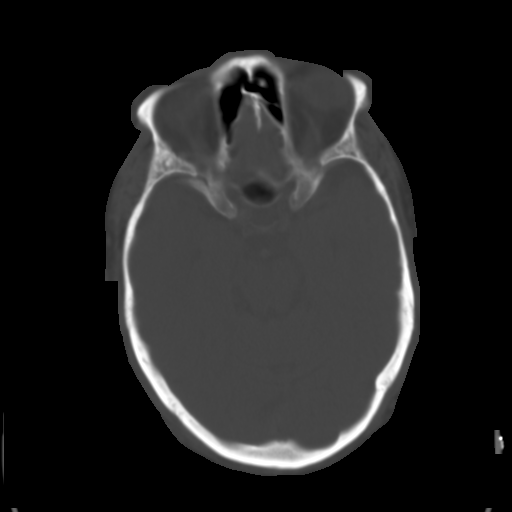
[im 15/34  brain]
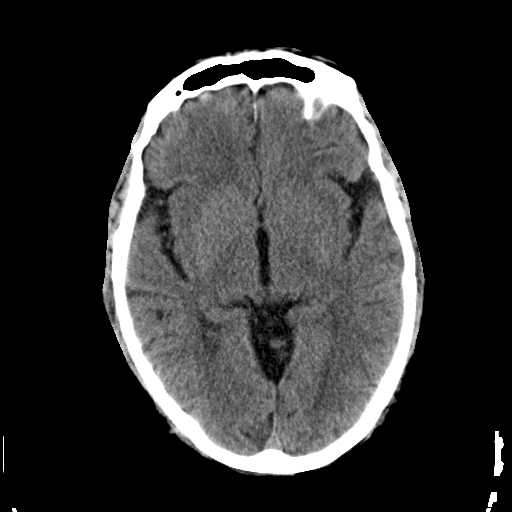
[im 17/34  brain]
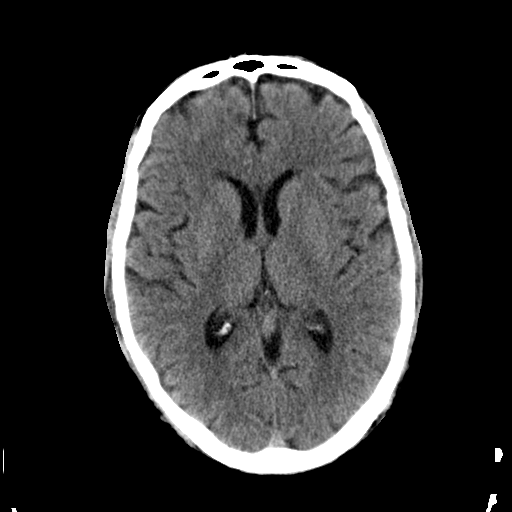
[im 19/34  brain]
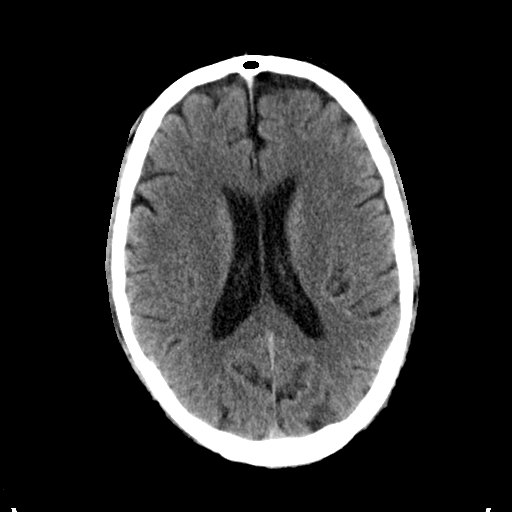
[im 22/34  brain]
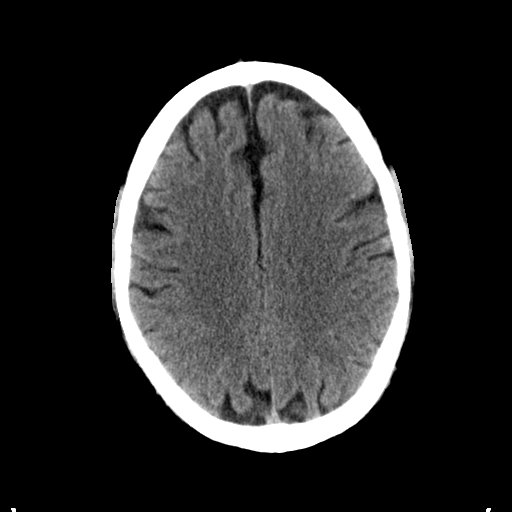
[im 22/34  bone]
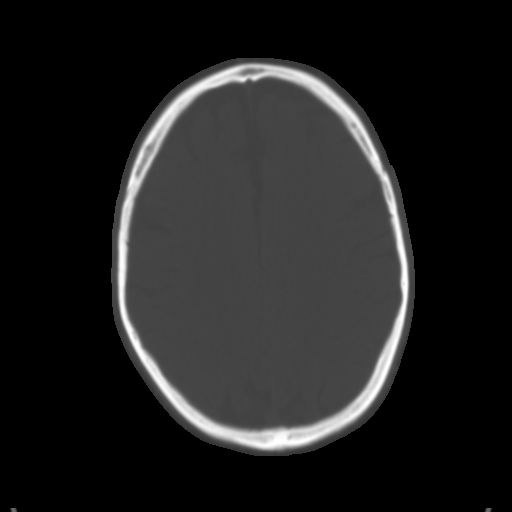
[im 24/34  brain]
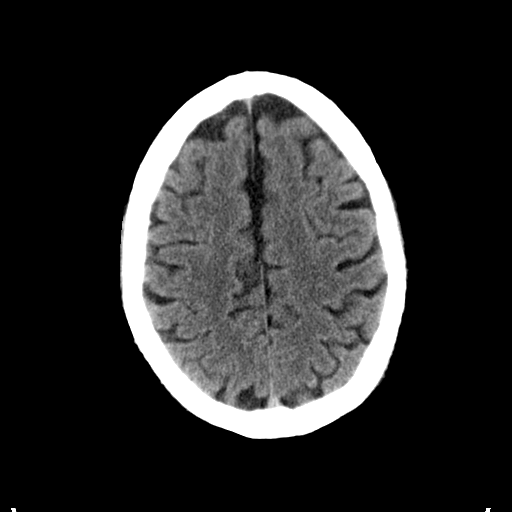
[im 26/34  brain]
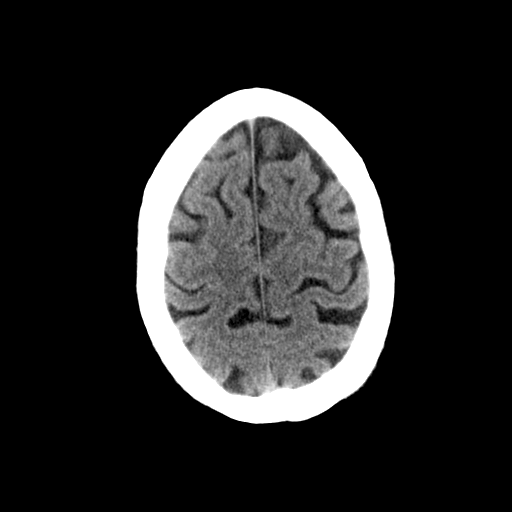
[im 29/34  brain]
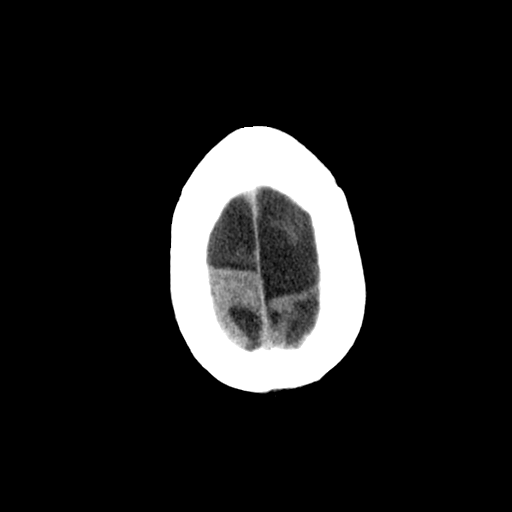
[im 31/34  brain]
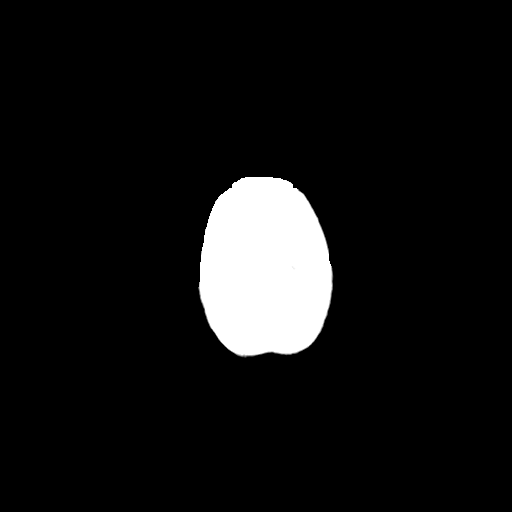
[im 31/34  bone]
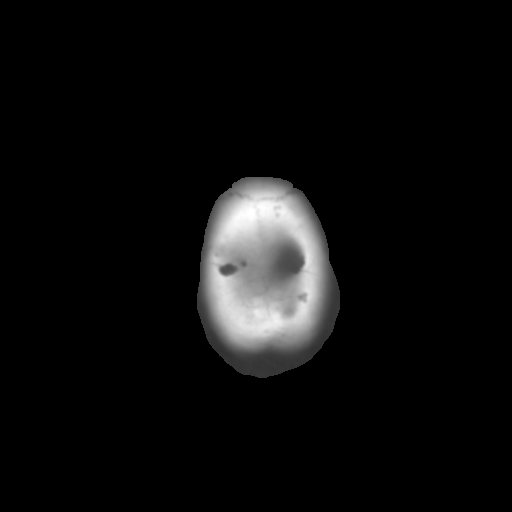

[Series 3: bone · axial · 0.45mm/px · z∈[-212,-166]mm · 3 of 34 slices shown]
[im 3/34  bone]
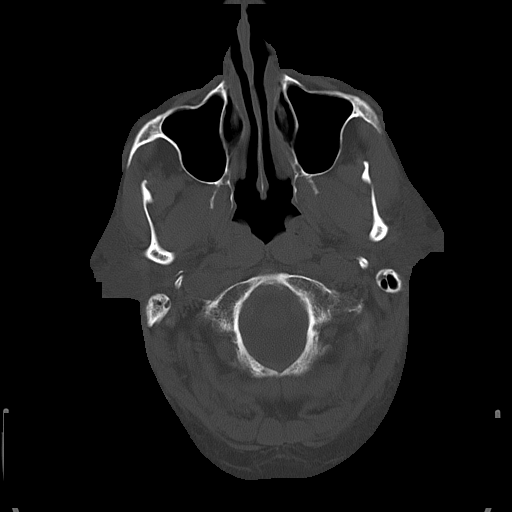
[im 8/34  bone]
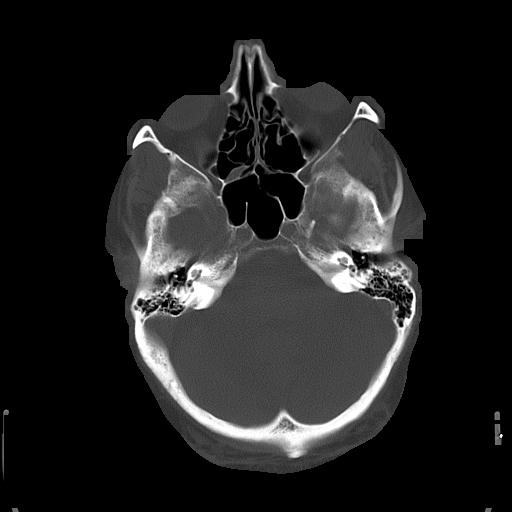
[im 12/34  bone]
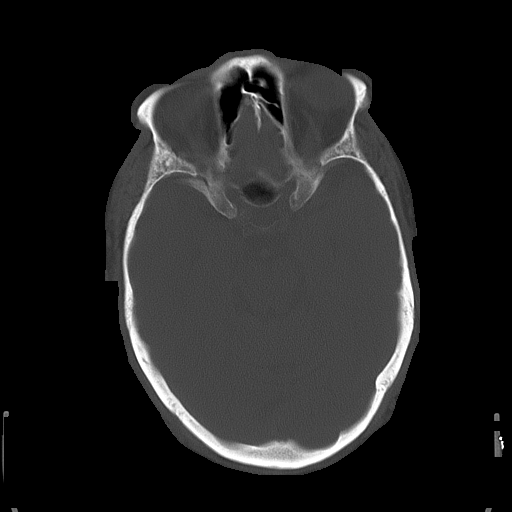

[16 of 30 positions shown; findings below may reference images not displayed]

PROCEDURE:     CT  - CT HEAD WITHOUT CONTRAST  - [DATE]  [DATE]

RESULT:     Axial noncontrast CT scanning was performed through the brain
with reconstructions at 5 mm intervals and slice thicknesses.

Are now all change at C2 called report is made thank you in the There is
mild diffuse cerebral and cerebellar atrophy. There is no acute intracranial
hemorrhage nor evidence of an evolving ischemic infarction. The cerebellum
and brainstem are normal in density. The ventricles are normal in size and
position. At bone window settings the observed portions of the paranasal
sinuses and mastoid air cells are clear.
IMPRESSION: I see no acute intracranial abnormality. There is mild
diffuse cerebral and cerebellar atrophy not out of proportion to the
patient's age.

## 2011-11-07 ENCOUNTER — Ambulatory Visit: Payer: Self-pay | Admitting: Internal Medicine

## 2011-11-07 LAB — LACTATE DEHYDROGENASE: LDH: 187 U/L (ref 87–241)

## 2011-11-07 LAB — CBC CANCER CENTER
Eosinophil %: 1.9 %
HCT: 37.8 % — ABNORMAL LOW (ref 40.0–52.0)
Lymphocyte #: 1.1 x10 3/mm (ref 1.0–3.6)
MCH: 35.4 pg — ABNORMAL HIGH (ref 26.0–34.0)
MCV: 103 fL — ABNORMAL HIGH (ref 80–100)
Monocyte #: 0.2 x10 3/mm (ref 0.0–0.7)
Monocyte %: 6.3 %
Neutrophil #: 2.4 x10 3/mm (ref 1.4–6.5)
Platelet: 200 x10 3/mm (ref 150–440)
RBC: 3.68 10*6/uL — ABNORMAL LOW (ref 4.40–5.90)
RDW: 14.7 % — ABNORMAL HIGH (ref 11.5–14.5)
WBC: 3.8 x10 3/mm (ref 3.8–10.6)

## 2011-11-07 LAB — CREATININE, SERUM
Creatinine: 1 mg/dL (ref 0.60–1.30)
EGFR (African American): >60

## 2011-11-07 LAB — URIC ACID: Uric Acid: 2.5 mg/dL — ABNORMAL LOW (ref 3.5–7.2)

## 2011-11-08 ENCOUNTER — Encounter (INDEPENDENT_AMBULATORY_CARE_PROVIDER_SITE_OTHER): Payer: Medicare Other | Admitting: Ophthalmology

## 2011-11-08 DIAGNOSIS — H43819 Vitreous degeneration, unspecified eye: Secondary | ICD-10-CM

## 2011-11-08 DIAGNOSIS — H35349 Macular cyst, hole, or pseudohole, unspecified eye: Secondary | ICD-10-CM

## 2011-11-08 DIAGNOSIS — E1139 Type 2 diabetes mellitus with other diabetic ophthalmic complication: Secondary | ICD-10-CM

## 2011-11-08 DIAGNOSIS — I10 Essential (primary) hypertension: Secondary | ICD-10-CM

## 2011-11-08 DIAGNOSIS — E11319 Type 2 diabetes mellitus with unspecified diabetic retinopathy without macular edema: Secondary | ICD-10-CM

## 2011-11-08 DIAGNOSIS — H35039 Hypertensive retinopathy, unspecified eye: Secondary | ICD-10-CM

## 2011-11-08 LAB — PROT IMMUNOELECTROPHORES(ARMC)

## 2011-11-11 ENCOUNTER — Other Ambulatory Visit (INDEPENDENT_AMBULATORY_CARE_PROVIDER_SITE_OTHER): Payer: Self-pay | Admitting: Ophthalmology

## 2011-11-11 NOTE — H&P (Signed)
Alex Smith is an 77 y.o. male.   Chief Complaint: loss of vision left eye HPI: one month of vision loss left eye  PMH:  Hairy Cell Leukemia Negative for MI, HIV, Hep No past surgical history on file.  No family history on file. Social History:  does not have a smoking history on file. He does not have any smokeless tobacco history on file. His alcohol and drug histories not on file.  Allergies: Allergies not on file  No current facility-administered medications on file as of .   No current outpatient prescriptions on file as of .    Review of systems otherwise negative  There were no vitals taken for this visit.  Physical exam: Mental status: oriented x3. Eyes: See eye exam associated with this date of surgery in media tab.  Scanned in by scanning center Ears, Nose, Throat: within normal limits Neck: Within Normal limits General: within normal limits Chest: Within normal limits Breast: deferred Heart: Within normal limits Abdomen: Within normal limits GU: deferred Extremities: within normal limits Skin: within normal limits  Assessment/Plan Macular Hole left eye Plan: To [facility] for Pars plana vitrectomy left eye with serum patch, laser treatment and gas injection.  Sherrie George 11/11/2011, 7:40 AM

## 2011-11-18 ENCOUNTER — Ambulatory Visit: Payer: Self-pay | Admitting: Internal Medicine

## 2011-12-03 ENCOUNTER — Ambulatory Visit (HOSPITAL_COMMUNITY): Admission: RE | Admit: 2011-12-03 | Payer: Medicare Other | Source: Ambulatory Visit | Admitting: Ophthalmology

## 2011-12-03 ENCOUNTER — Encounter (HOSPITAL_COMMUNITY): Admission: RE | Payer: Self-pay | Source: Ambulatory Visit

## 2011-12-03 SURGERY — PARS PLANA VITRECTOMY WITH LASER FOR MACULAR HOLE
Anesthesia: General | Laterality: Left

## 2011-12-09 ENCOUNTER — Inpatient Hospital Stay (INDEPENDENT_AMBULATORY_CARE_PROVIDER_SITE_OTHER): Payer: Medicare Other | Admitting: Ophthalmology

## 2012-01-10 ENCOUNTER — Ambulatory Visit: Payer: Self-pay | Admitting: Internal Medicine

## 2012-01-10 LAB — HEPATIC FUNCTION PANEL A (ARMC)
Albumin: 4 g/dL (ref 3.4–5.0)
Alkaline Phosphatase: 84 U/L (ref 50–136)
Bilirubin,Total: 0.5 mg/dL (ref 0.2–1.0)
SGPT (ALT): 29 U/L

## 2012-01-10 LAB — CBC CANCER CENTER
Basophil #: 0 x10 3/mm (ref 0.0–0.1)
Eosinophil %: 1.3 %
HGB: 13.2 g/dL (ref 13.0–18.0)
Lymphocyte #: 1.1 x10 3/mm (ref 1.0–3.6)
MCH: 35.4 pg — ABNORMAL HIGH (ref 26.0–34.0)
MCHC: 34.1 g/dL (ref 32.0–36.0)
MCV: 104 fL — ABNORMAL HIGH (ref 80–100)
Monocyte #: 0.3 x10 3/mm (ref 0.2–1.0)
Monocyte %: 6.2 %
Neutrophil %: 67.7 %
Platelet: 189 x10 3/mm (ref 150–440)
RBC: 3.72 10*6/uL — ABNORMAL LOW (ref 4.40–5.90)

## 2012-01-10 LAB — CREATININE, SERUM
Creatinine: 1.15 mg/dL (ref 0.60–1.30)
EGFR (African American): >60

## 2012-01-10 LAB — LACTATE DEHYDROGENASE: LDH: 211 U/L (ref 87–241)

## 2012-01-18 ENCOUNTER — Ambulatory Visit: Payer: Self-pay | Admitting: Internal Medicine

## 2012-03-13 ENCOUNTER — Ambulatory Visit: Payer: Self-pay | Admitting: Internal Medicine

## 2012-03-13 LAB — CBC CANCER CENTER
Basophil #: 0 x10 3/mm (ref 0.0–0.1)
Eosinophil %: 2.9 %
HGB: 13.4 g/dL (ref 13.0–18.0)
Lymphocyte #: 1 x10 3/mm (ref 1.0–3.6)
Lymphocyte %: 26.7 %
MCH: 36.4 pg — ABNORMAL HIGH (ref 26.0–34.0)
MCHC: 35 g/dL (ref 32.0–36.0)
MCV: 104 fL — ABNORMAL HIGH (ref 80–100)
Monocyte #: 0.2 x10 3/mm (ref 0.2–1.0)
Neutrophil %: 64.1 %
Platelet: 164 x10 3/mm (ref 150–440)
RBC: 3.69 10*6/uL — ABNORMAL LOW (ref 4.40–5.90)
WBC: 3.7 x10 3/mm — ABNORMAL LOW (ref 3.8–10.6)

## 2012-03-13 LAB — CREATININE, SERUM
Creatinine: 1.06 mg/dL (ref 0.60–1.30)
EGFR (African American): >60

## 2012-03-13 LAB — LACTATE DEHYDROGENASE: LDH: 204 U/L (ref 81–234)

## 2012-03-13 LAB — URIC ACID: Uric Acid: 2.2 mg/dL — ABNORMAL LOW (ref 3.5–7.2)

## 2012-03-19 ENCOUNTER — Ambulatory Visit: Payer: Self-pay | Admitting: Internal Medicine

## 2012-05-13 ENCOUNTER — Encounter (INDEPENDENT_AMBULATORY_CARE_PROVIDER_SITE_OTHER): Payer: Medicare Other | Admitting: Ophthalmology

## 2012-05-13 DIAGNOSIS — H35349 Macular cyst, hole, or pseudohole, unspecified eye: Secondary | ICD-10-CM

## 2012-05-13 DIAGNOSIS — H43819 Vitreous degeneration, unspecified eye: Secondary | ICD-10-CM

## 2012-05-13 DIAGNOSIS — I10 Essential (primary) hypertension: Secondary | ICD-10-CM

## 2012-05-13 DIAGNOSIS — H35039 Hypertensive retinopathy, unspecified eye: Secondary | ICD-10-CM

## 2012-05-13 DIAGNOSIS — H33309 Unspecified retinal break, unspecified eye: Secondary | ICD-10-CM

## 2012-05-28 ENCOUNTER — Ambulatory Visit: Payer: Self-pay | Admitting: Internal Medicine

## 2012-05-28 LAB — CBC CANCER CENTER
Basophil #: 0 x10 3/mm (ref 0.0–0.1)
Eosinophil %: 3.1 %
HCT: 35.6 % — ABNORMAL LOW (ref 40.0–52.0)
Lymphocyte #: 1.2 x10 3/mm (ref 1.0–3.6)
MCH: 36.5 pg — ABNORMAL HIGH (ref 26.0–34.0)
MCV: 103 fL — ABNORMAL HIGH (ref 80–100)
Monocyte #: 0.2 x10 3/mm (ref 0.2–1.0)
Neutrophil #: 1.7 x10 3/mm (ref 1.4–6.5)
Neutrophil %: 52.8 %
Platelet: 186 x10 3/mm (ref 150–440)
RBC: 3.46 10*6/uL — ABNORMAL LOW (ref 4.40–5.90)
RDW: 14.5 % (ref 11.5–14.5)

## 2012-05-28 LAB — URIC ACID: Uric Acid: 2.5 mg/dL — ABNORMAL LOW (ref 3.5–7.2)

## 2012-06-01 LAB — PROT IMMUNOELECTROPHORES(ARMC)

## 2012-06-19 ENCOUNTER — Ambulatory Visit: Payer: Self-pay | Admitting: Internal Medicine

## 2012-06-19 HISTORY — PX: EYE SURGERY: SHX253

## 2012-06-25 LAB — CBC CANCER CENTER
Basophil %: 1 %
Eosinophil #: 0.1 x10 3/mm (ref 0.0–0.7)
HCT: 39.4 % — ABNORMAL LOW (ref 40.0–52.0)
HGB: 13.4 g/dL (ref 13.0–18.0)
Lymphocyte #: 1.7 x10 3/mm (ref 1.0–3.6)
Lymphocyte %: 35.2 %
MCH: 35.7 pg — ABNORMAL HIGH (ref 26.0–34.0)
MCHC: 34 g/dL (ref 32.0–36.0)
MCV: 105 fL — ABNORMAL HIGH (ref 80–100)
Monocyte %: 5.7 %
Neutrophil #: 2.7 x10 3/mm (ref 1.4–6.5)
Platelet: 187 x10 3/mm (ref 150–440)

## 2012-07-06 ENCOUNTER — Encounter (HOSPITAL_COMMUNITY): Payer: Self-pay | Admitting: *Deleted

## 2012-07-06 ENCOUNTER — Encounter (HOSPITAL_COMMUNITY): Payer: Self-pay | Admitting: Pharmacy Technician

## 2012-07-06 NOTE — Progress Notes (Signed)
Pt had stress test at either [facility] clinic or [facility]. Requested from [facility], also EKG and CXR if available.

## 2012-07-07 ENCOUNTER — Encounter (INDEPENDENT_AMBULATORY_CARE_PROVIDER_SITE_OTHER): Payer: Medicare Other | Admitting: Ophthalmology

## 2012-07-07 ENCOUNTER — Encounter (HOSPITAL_COMMUNITY): Payer: Self-pay | Admitting: *Deleted

## 2012-07-07 ENCOUNTER — Encounter (HOSPITAL_COMMUNITY)
Admission: RE | Disposition: A | Discharge status: Home / Self Care | Payer: Self-pay | Source: Ambulatory Visit | Attending: Ophthalmology

## 2012-07-07 ENCOUNTER — Ambulatory Visit (HOSPITAL_COMMUNITY): Payer: Medicare Other

## 2012-07-07 ENCOUNTER — Encounter (HOSPITAL_COMMUNITY): Payer: Self-pay | Admitting: General Practice

## 2012-07-07 ENCOUNTER — Ambulatory Visit (HOSPITAL_COMMUNITY)
Admission: RE | Admit: 2012-07-07 | Discharge: 2012-07-08 | Disposition: A | Discharge status: Home / Self Care | Payer: Medicare Other | Source: Ambulatory Visit | Attending: Ophthalmology | Admitting: Ophthalmology

## 2012-07-07 ENCOUNTER — Ambulatory Visit (HOSPITAL_COMMUNITY): Payer: Medicare Other | Admitting: *Deleted

## 2012-07-07 DIAGNOSIS — H26499 Other secondary cataract, unspecified eye: Secondary | ICD-10-CM

## 2012-07-07 DIAGNOSIS — H35349 Macular cyst, hole, or pseudohole, unspecified eye: Secondary | ICD-10-CM

## 2012-07-07 DIAGNOSIS — H33309 Unspecified retinal break, unspecified eye: Secondary | ICD-10-CM | POA: Insufficient documentation

## 2012-07-07 DIAGNOSIS — I10 Essential (primary) hypertension: Secondary | ICD-10-CM | POA: Insufficient documentation

## 2012-07-07 DIAGNOSIS — H43819 Vitreous degeneration, unspecified eye: Secondary | ICD-10-CM

## 2012-07-07 DIAGNOSIS — Z794 Long term (current) use of insulin: Secondary | ICD-10-CM | POA: Insufficient documentation

## 2012-07-07 DIAGNOSIS — E119 Type 2 diabetes mellitus without complications: Secondary | ICD-10-CM | POA: Insufficient documentation

## 2012-07-07 DIAGNOSIS — H35379 Puckering of macula, unspecified eye: Secondary | ICD-10-CM

## 2012-07-07 DIAGNOSIS — E785 Hyperlipidemia, unspecified: Secondary | ICD-10-CM | POA: Insufficient documentation

## 2012-07-07 HISTORY — PX: SERUM PATCH: SHX6091

## 2012-07-07 HISTORY — DX: Gastro-esophageal reflux disease without esophagitis: K21.9

## 2012-07-07 HISTORY — DX: Essential (primary) hypertension: I10

## 2012-07-07 HISTORY — DX: Pneumonia, unspecified organism: J18.9

## 2012-07-07 HISTORY — DX: Hyperlipidemia, unspecified: E78.5

## 2012-07-07 HISTORY — PX: GAS INSERTION: SHX5336

## 2012-07-07 HISTORY — DX: Basal cell carcinoma of skin of unspecified parts of face: C44.310

## 2012-07-07 HISTORY — PX: 25 GAUGE PARS PLANA VITRECTOMY WITH 20 GAUGE MVR PORT FOR MACULAR HOLE: SHX6096

## 2012-07-07 LAB — CBC
HCT: 37.5 % — ABNORMAL LOW (ref 39.0–52.0)
MCV: 99.7 fL (ref 78.0–100.0)
RBC: 3.76 MIL/uL — ABNORMAL LOW (ref 4.22–5.81)
WBC: 4.5 10*3/uL (ref 4.0–10.5)

## 2012-07-07 LAB — BASIC METABOLIC PANEL
BUN: 15 mg/dL (ref 6–23)
CO2: 26 mEq/L (ref 19–32)
Chloride: 102 mEq/L (ref 96–112)
Creatinine, Ser: 0.77 mg/dL (ref 0.50–1.35)

## 2012-07-07 LAB — GLUCOSE, CAPILLARY
Glucose-Capillary: 153 mg/dL — ABNORMAL HIGH (ref 70–99)
Glucose-Capillary: 197 mg/dL — ABNORMAL HIGH (ref 70–99)
Glucose-Capillary: 200 mg/dL — ABNORMAL HIGH (ref 70–99)

## 2012-07-07 IMAGING — CR DG CHEST 2V
2 series · 2 of 2 positions shown · non-contrast
Comparison: None.

CLINICAL DATA: Preop eye surgery

CHEST - 2 VIEW

[view not recorded (1 of 2)]
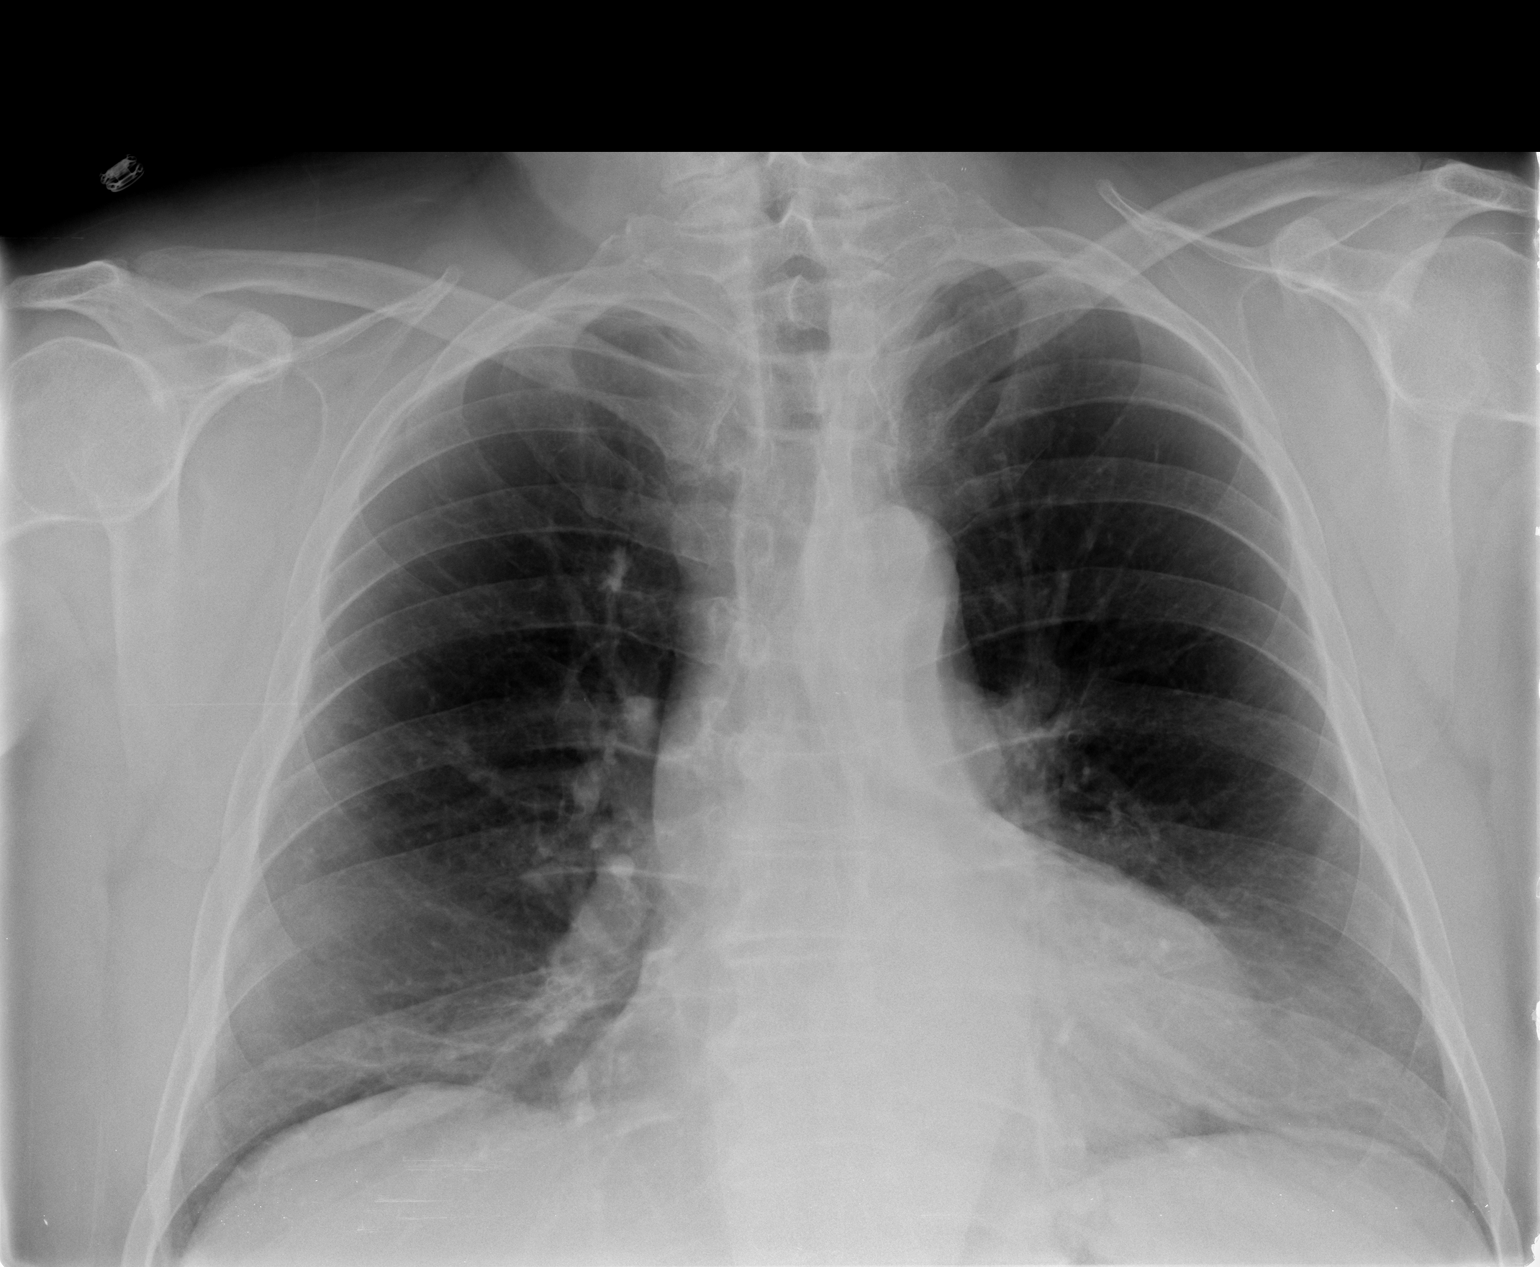

[view not recorded (2 of 2)]
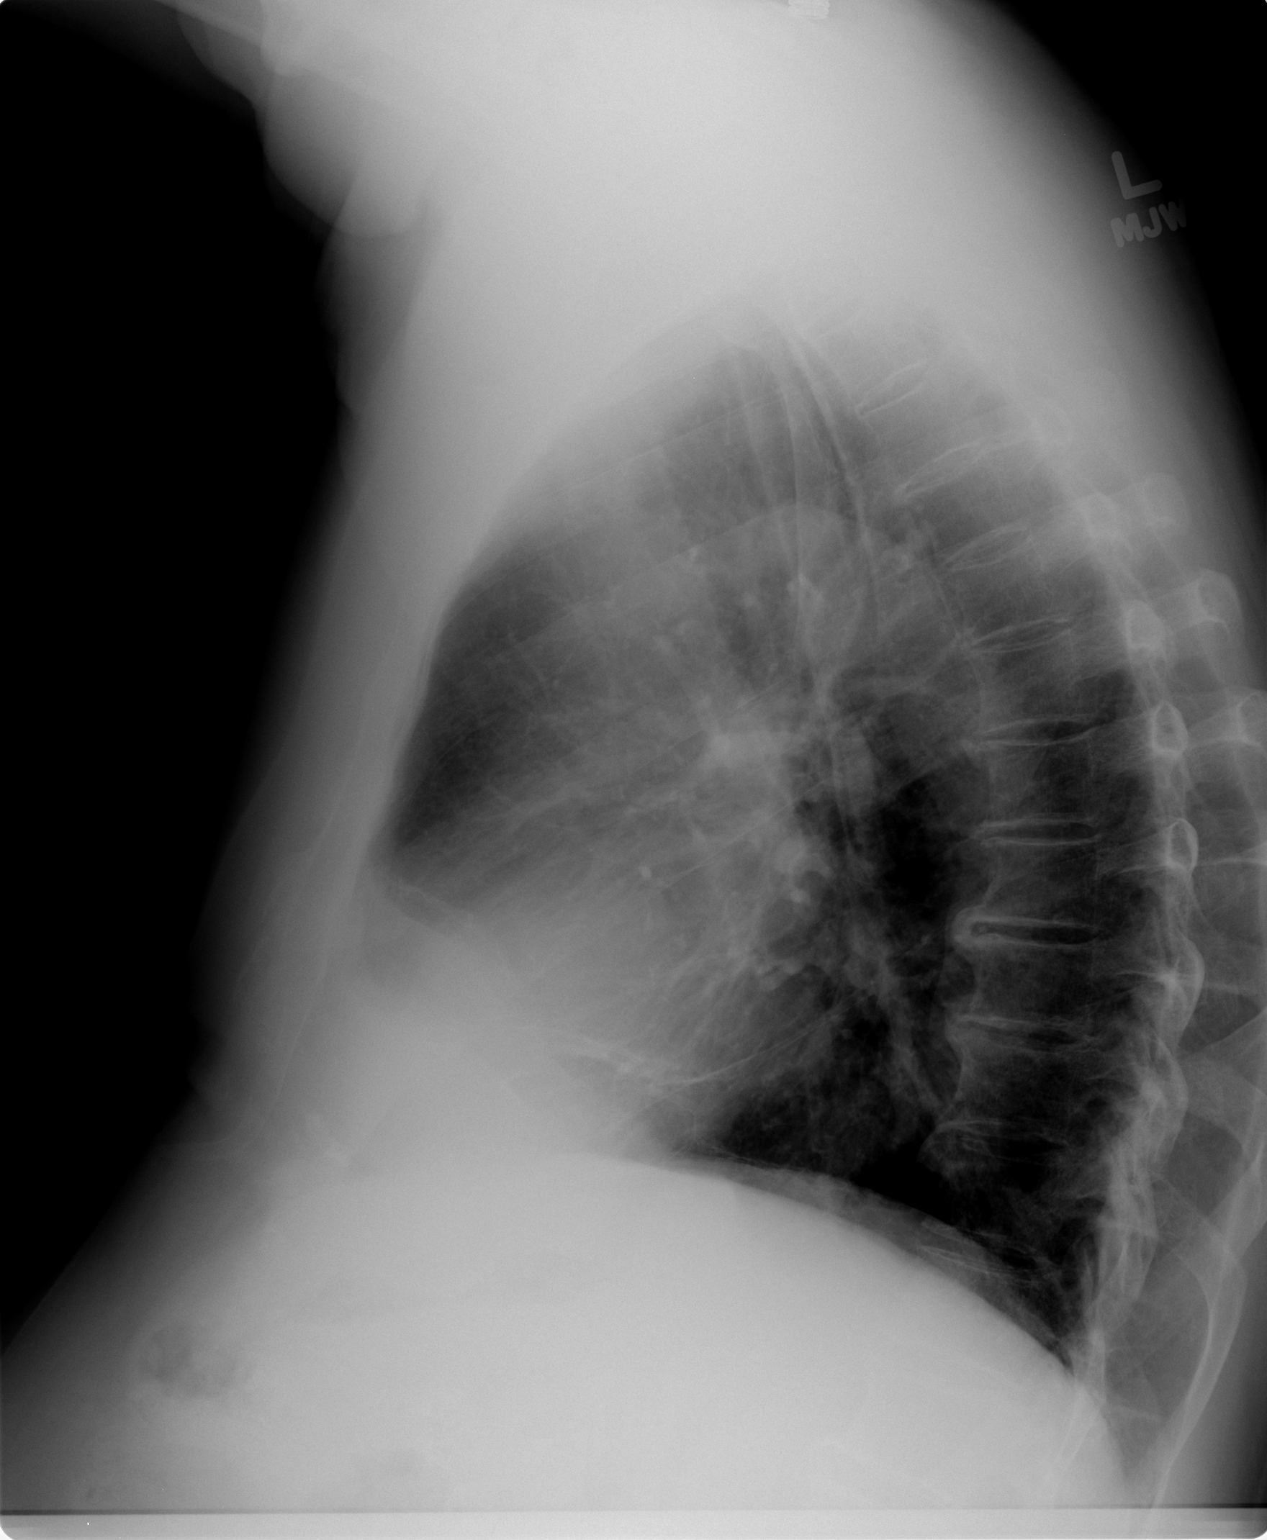

[2 of 2 positions shown; findings below may reference images not displayed]

FINDINGS: Lungs are essentially clear. No pleural effusion or
pneumothorax.

Cardiomediastinal silhouette is within normal limits.

Degenerative changes of the visualized thoracolumbar spine.
IMPRESSION: No evidence of acute cardiopulmonary disease.

## 2012-07-07 SURGERY — 25 GAUGE PARS PLANA VITRECTOMY WITH 20 GAUGE MVR PORT FOR MACULAR HOLE
Anesthesia: General | Site: Eye | Laterality: Left | Wound class: Clean

## 2012-07-07 MED ORDER — HYDROMORPHONE HCL PF 1 MG/ML IJ SOLN
INTRAMUSCULAR | Status: AC
  Filled 2012-07-07: qty 1

## 2012-07-07 MED ORDER — INSULIN ASPART 100 UNIT/ML ~~LOC~~ SOLN
10.0000 [IU] | Freq: Two times a day (BID) | SUBCUTANEOUS | Status: DC
  Administered 2012-07-07: 10 [IU] via SUBCUTANEOUS

## 2012-07-07 MED ORDER — HYALURONIDASE HUMAN 150 UNIT/ML IJ SOLN
INTRAMUSCULAR | Status: AC
  Filled 2012-07-07: qty 1

## 2012-07-07 MED ORDER — CYCLOPENTOLATE HCL 1 % OP SOLN
OPHTHALMIC | Status: AC
  Administered 2012-07-07: 1 [drp] via OPHTHALMIC
  Filled 2012-07-07: qty 2

## 2012-07-07 MED ORDER — INSULIN ASPART 100 UNIT/ML ~~LOC~~ SOLN
0.0000 [IU] | SUBCUTANEOUS | Status: DC
  Administered 2012-07-07 (×2): 3 [IU] via SUBCUTANEOUS
  Administered 2012-07-08: 2 [IU] via SUBCUTANEOUS

## 2012-07-07 MED ORDER — FENTANYL CITRATE 0.05 MG/ML IJ SOLN
INTRAMUSCULAR | Status: DC | PRN
  Administered 2012-07-07 (×3): 50 ug via INTRAVENOUS

## 2012-07-07 MED ORDER — PREDNISOLONE ACETATE 1 % OP SUSP
1.0000 [drp] | Freq: Four times a day (QID) | OPHTHALMIC | Status: DC
  Administered 2012-07-08: 1 [drp] via OPHTHALMIC
  Filled 2012-07-07: qty 1

## 2012-07-07 MED ORDER — ACETAMINOPHEN 325 MG PO TABS: 325.0000 mg | ORAL_TABLET | ORAL | Status: DC | PRN

## 2012-07-07 MED ORDER — ATROPINE SULFATE 1 % OP SOLN
OPHTHALMIC | Status: DC | PRN
  Administered 2012-07-07: 2 [drp] via OPHTHALMIC

## 2012-07-07 MED ORDER — BSS IO SOLN
INTRAOCULAR | Status: AC
  Filled 2012-07-07: qty 15

## 2012-07-07 MED ORDER — SODIUM HYALURONATE 10 MG/ML IO SOLN
INTRAOCULAR | Status: AC
  Filled 2012-07-07: qty 0.85

## 2012-07-07 MED ORDER — ONDANSETRON HCL 4 MG/2ML IJ SOLN: 4.0000 mg | Freq: Once | INTRAMUSCULAR | Status: DC | PRN

## 2012-07-07 MED ORDER — EPINEPHRINE HCL 1 MG/ML IJ SOLN
INTRAMUSCULAR | Status: AC
  Filled 2012-07-07: qty 1

## 2012-07-07 MED ORDER — VITAMIN E 180 MG (400 UNIT) PO CAPS
400.0000 [IU] | ORAL_CAPSULE | Freq: Every day | ORAL | Status: DC
  Filled 2012-07-07 (×2): qty 1

## 2012-07-07 MED ORDER — ROCURONIUM BROMIDE 100 MG/10ML IV SOLN
INTRAVENOUS | Status: DC | PRN
  Administered 2012-07-07: 40 mg via INTRAVENOUS

## 2012-07-07 MED ORDER — INSULIN GLARGINE 100 UNIT/ML ~~LOC~~ SOLN
5.0000 [IU] | Freq: Every day | SUBCUTANEOUS | Status: DC
  Administered 2012-07-07: 5 [IU] via SUBCUTANEOUS

## 2012-07-07 MED ORDER — PROPOFOL 10 MG/ML IV BOLUS
INTRAVENOUS | Status: DC | PRN
  Administered 2012-07-07: 150 mg via INTRAVENOUS

## 2012-07-07 MED ORDER — MINERAL OIL LIGHT 100 % EX OIL
TOPICAL_OIL | CUTANEOUS | Status: AC
  Filled 2012-07-07: qty 25

## 2012-07-07 MED ORDER — BACITRACIN-POLYMYXIN B 500-10000 UNIT/GM OP OINT
1.0000 "application " | TOPICAL_OINTMENT | Freq: Four times a day (QID) | OPHTHALMIC | Status: DC
  Administered 2012-07-08: 1 via OPHTHALMIC
  Filled 2012-07-07 (×2): qty 3.5

## 2012-07-07 MED ORDER — DOCUSATE SODIUM 100 MG PO CAPS
100.0000 mg | ORAL_CAPSULE | Freq: Two times a day (BID) | ORAL | Status: DC
  Administered 2012-07-07: 100 mg via ORAL
  Filled 2012-07-07: qty 1

## 2012-07-07 MED ORDER — DEXAMETHASONE SODIUM PHOSPHATE 10 MG/ML IJ SOLN
INTRAMUSCULAR | Status: DC | PRN
  Administered 2012-07-07: 10 mg

## 2012-07-07 MED ORDER — EPHEDRINE SULFATE 50 MG/ML IJ SOLN
INTRAMUSCULAR | Status: DC | PRN
  Administered 2012-07-07: 10 mg via INTRAVENOUS

## 2012-07-07 MED ORDER — MAGNESIUM HYDROXIDE 400 MG/5ML PO SUSP: 15.0000 mL | Freq: Four times a day (QID) | ORAL | Status: DC | PRN

## 2012-07-07 MED ORDER — CEFAZOLIN SODIUM-DEXTROSE 2-3 GM-% IV SOLR
2.0000 g | INTRAVENOUS | Status: AC
  Administered 2012-07-07: 2 g via INTRAVENOUS
  Filled 2012-07-07: qty 50

## 2012-07-07 MED ORDER — HEMOSTATIC AGENTS (NO CHARGE) OPTIME
TOPICAL | Status: DC | PRN
  Administered 2012-07-07: 1 via TOPICAL

## 2012-07-07 MED ORDER — MIDAZOLAM HCL 5 MG/5ML IJ SOLN
INTRAMUSCULAR | Status: DC | PRN
  Administered 2012-07-07: 1 mg via INTRAVENOUS

## 2012-07-07 MED ORDER — PHENYLEPHRINE HCL 2.5 % OP SOLN
1.0000 [drp] | OPHTHALMIC | Status: AC | PRN
  Administered 2012-07-07: 1 [drp] via OPHTHALMIC
  Administered 2012-07-07: 10:00:00 via OPHTHALMIC
  Administered 2012-07-07: 1 [drp] via OPHTHALMIC

## 2012-07-07 MED ORDER — NEOSTIGMINE METHYLSULFATE 1 MG/ML IJ SOLN
INTRAMUSCULAR | Status: DC | PRN
  Administered 2012-07-07: 5 mg via INTRAVENOUS

## 2012-07-07 MED ORDER — SODIUM CHLORIDE 0.9 % IJ SOLN
INTRAMUSCULAR | Status: DC | PRN
  Administered 2012-07-07: 11:00:00

## 2012-07-07 MED ORDER — ACETAZOLAMIDE SODIUM 500 MG IJ SOLR
500.0000 mg | Freq: Once | INTRAMUSCULAR | Status: AC
  Administered 2012-07-08: 500 mg via INTRAVENOUS
  Filled 2012-07-07: qty 500

## 2012-07-07 MED ORDER — TETRACAINE HCL 0.5 % OP SOLN
2.0000 [drp] | Freq: Once | OPHTHALMIC | Status: AC
  Administered 2012-07-08: 2 [drp] via OPHTHALMIC
  Filled 2012-07-07: qty 2

## 2012-07-07 MED ORDER — ACYCLOVIR 400 MG PO TABS
400.0000 mg | ORAL_TABLET | Freq: Three times a day (TID) | ORAL | Status: DC
Start: 2012-07-07 — End: 2012-07-07
  Filled 2012-07-07 (×2): qty 1

## 2012-07-07 MED ORDER — ATROPINE SULFATE 1 % OP SOLN
OPHTHALMIC | Status: AC
  Filled 2012-07-07: qty 2

## 2012-07-07 MED ORDER — ACETAZOLAMIDE SODIUM 500 MG IJ SOLR
INTRAMUSCULAR | Status: AC
  Filled 2012-07-07: qty 500

## 2012-07-07 MED ORDER — HYDROMORPHONE HCL PF 1 MG/ML IJ SOLN
0.2500 mg | INTRAMUSCULAR | Status: DC | PRN
  Administered 2012-07-07 (×2): 0.5 mg via INTRAVENOUS

## 2012-07-07 MED ORDER — GENTAMICIN SULFATE 40 MG/ML IJ SOLN
INTRAMUSCULAR | Status: AC
  Filled 2012-07-07: qty 2

## 2012-07-07 MED ORDER — LIDOCAINE HCL 2 % IJ SOLN
INTRAMUSCULAR | Status: AC
  Filled 2012-07-07: qty 20

## 2012-07-07 MED ORDER — CYCLOPENTOLATE HCL 1 % OP SOLN
1.0000 [drp] | OPHTHALMIC | Status: AC | PRN
  Administered 2012-07-07 (×3): 1 [drp] via OPHTHALMIC

## 2012-07-07 MED ORDER — GATIFLOXACIN 0.5 % OP SOLN
1.0000 [drp] | OPHTHALMIC | Status: AC | PRN
  Administered 2012-07-07 (×3): 1 [drp] via OPHTHALMIC

## 2012-07-07 MED ORDER — POLYMYXIN B SULFATE 500000 UNITS IJ SOLR
INTRAMUSCULAR | Status: AC
  Filled 2012-07-07: qty 1

## 2012-07-07 MED ORDER — GLYCOPYRROLATE 0.2 MG/ML IJ SOLN
INTRAMUSCULAR | Status: DC | PRN
  Administered 2012-07-07: .6 mg via INTRAVENOUS

## 2012-07-07 MED ORDER — TROPICAMIDE 1 % OP SOLN
OPHTHALMIC | Status: AC
  Administered 2012-07-07: 1 [drp] via OPHTHALMIC
  Filled 2012-07-07: qty 3

## 2012-07-07 MED ORDER — LISINOPRIL 20 MG PO TABS
20.0000 mg | ORAL_TABLET | Freq: Every day | ORAL | Status: DC
  Filled 2012-07-07 (×2): qty 1

## 2012-07-07 MED ORDER — HYPROMELLOSE (GONIOSCOPIC) 2.5 % OP SOLN
OPHTHALMIC | Status: AC
  Filled 2012-07-07: qty 15

## 2012-07-07 MED ORDER — MORPHINE SULFATE 2 MG/ML IJ SOLN: 1.0000 mg | INTRAMUSCULAR | Status: DC | PRN

## 2012-07-07 MED ORDER — BUPIVACAINE HCL (PF) 0.75 % IJ SOLN
INTRAMUSCULAR | Status: AC
  Filled 2012-07-07: qty 10

## 2012-07-07 MED ORDER — SIMVASTATIN 5 MG PO TABS
5.0000 mg | ORAL_TABLET | Freq: Every day | ORAL | Status: DC
  Administered 2012-07-07: 5 mg via ORAL
  Filled 2012-07-07 (×2): qty 1

## 2012-07-07 MED ORDER — SODIUM CHLORIDE 0.45 % IV SOLN
INTRAVENOUS | Status: DC
  Administered 2012-07-07: 10 mL/h via INTRAVENOUS

## 2012-07-07 MED ORDER — BSS PLUS IO SOLN
INTRAOCULAR | Status: AC
  Filled 2012-07-07: qty 500

## 2012-07-07 MED ORDER — ONDANSETRON HCL 4 MG/2ML IJ SOLN: 4.0000 mg | Freq: Four times a day (QID) | INTRAMUSCULAR | Status: DC | PRN

## 2012-07-07 MED ORDER — HYDROCODONE-ACETAMINOPHEN 5-325 MG PO TABS
1.0000 | ORAL_TABLET | ORAL | Status: DC | PRN
  Administered 2012-07-07: 1 via ORAL
  Filled 2012-07-07: qty 1

## 2012-07-07 MED ORDER — ONDANSETRON HCL 4 MG/2ML IJ SOLN
INTRAMUSCULAR | Status: DC | PRN
  Administered 2012-07-07: 4 mg via INTRAVENOUS

## 2012-07-07 MED ORDER — INSULIN NPH (HUMAN) (ISOPHANE) 100 UNIT/ML ~~LOC~~ SUSP
30.0000 [IU] | Freq: Every day | SUBCUTANEOUS | Status: DC
  Administered 2012-07-07: 30 [IU] via SUBCUTANEOUS
  Filled 2012-07-07: qty 10

## 2012-07-07 MED ORDER — GATIFLOXACIN 0.5 % OP SOLN
OPHTHALMIC | Status: AC
  Administered 2012-07-07: 1 [drp] via OPHTHALMIC
  Filled 2012-07-07: qty 2.5

## 2012-07-07 MED ORDER — PROVISC 10 MG/ML IO SOLN
INTRAOCULAR | Status: DC | PRN
  Administered 2012-07-07: 8.5 mg via INTRAOCULAR

## 2012-07-07 MED ORDER — NIFEDIPINE ER 30 MG PO TB24
30.0000 mg | ORAL_TABLET | Freq: Every day | ORAL | Status: DC
  Filled 2012-07-07 (×2): qty 1

## 2012-07-07 MED ORDER — LATANOPROST 0.005 % OP SOLN
1.0000 [drp] | Freq: Every day | OPHTHALMIC | Status: DC
  Administered 2012-07-08: 1 [drp] via OPHTHALMIC
  Filled 2012-07-07: qty 2.5

## 2012-07-07 MED ORDER — INSULIN REGULAR HUMAN 100 UNIT/ML IJ SOLN
10.0000 [IU] | Freq: Once | INTRAMUSCULAR | Status: AC
  Administered 2012-07-07: 10 [IU] via SUBCUTANEOUS
  Filled 2012-07-07: qty 0.1

## 2012-07-07 MED ORDER — SODIUM CHLORIDE 0.9 % IV SOLN
INTRAVENOUS | Status: DC
  Administered 2012-07-07: 12:00:00 via INTRAVENOUS
  Administered 2012-07-07: 45 mL/h via INTRAVENOUS

## 2012-07-07 MED ORDER — METFORMIN HCL 500 MG PO TABS
1000.0000 mg | ORAL_TABLET | Freq: Two times a day (BID) | ORAL | Status: DC
Start: 2012-07-07 — End: 2012-07-08
  Administered 2012-07-07: 1000 mg via ORAL
  Filled 2012-07-07 (×4): qty 2

## 2012-07-07 MED ORDER — PHENYLEPHRINE HCL 10 MG/ML IJ SOLN
INTRAMUSCULAR | Status: DC | PRN
  Administered 2012-07-07: 80 ug via INTRAVENOUS

## 2012-07-07 MED ORDER — BRIMONIDINE TARTRATE 0.2 % OP SOLN
1.0000 [drp] | Freq: Two times a day (BID) | OPHTHALMIC | Status: DC
  Administered 2012-07-08: 1 [drp] via OPHTHALMIC
  Filled 2012-07-07: qty 5

## 2012-07-07 MED ORDER — EPINEPHRINE HCL 1 MG/ML IJ SOLN
INTRAOCULAR | Status: DC | PRN
  Administered 2012-07-07: 11:00:00

## 2012-07-07 MED ORDER — MUPIROCIN 2 % EX OINT
TOPICAL_OINTMENT | CUTANEOUS | Status: AC
  Administered 2012-07-07: 11:00:00
  Administered 2012-07-07: 10:00:00 via NASAL
  Administered 2012-07-07: 11:00:00
  Filled 2012-07-07: qty 22

## 2012-07-07 MED ORDER — PHENYLEPHRINE HCL 2.5 % OP SOLN
OPHTHALMIC | Status: AC
  Filled 2012-07-07: qty 3

## 2012-07-07 MED ORDER — BUPIVACAINE HCL (PF) 0.75 % IJ SOLN
INTRAMUSCULAR | Status: DC | PRN
  Administered 2012-07-07: 10 mL

## 2012-07-07 MED ORDER — DEXAMETHASONE SODIUM PHOSPHATE 10 MG/ML IJ SOLN
INTRAMUSCULAR | Status: AC
  Filled 2012-07-07: qty 1

## 2012-07-07 MED ORDER — ACYCLOVIR 400 MG PO TABS
400.0000 mg | ORAL_TABLET | Freq: Two times a day (BID) | ORAL | Status: DC
  Filled 2012-07-07 (×2): qty 1

## 2012-07-07 MED ORDER — TEMAZEPAM 15 MG PO CAPS: 15.0000 mg | ORAL_CAPSULE | Freq: Every evening | ORAL | Status: DC | PRN

## 2012-07-07 MED ORDER — BACITRACIN-POLYMYXIN B 500-10000 UNIT/GM OP OINT
TOPICAL_OINTMENT | OPHTHALMIC | Status: AC
  Filled 2012-07-07: qty 3.5

## 2012-07-07 MED ORDER — TROPICAMIDE 1 % OP SOLN
1.0000 [drp] | OPHTHALMIC | Status: AC | PRN
  Administered 2012-07-07 (×3): 1 [drp] via OPHTHALMIC

## 2012-07-07 MED ORDER — GATIFLOXACIN 0.5 % OP SOLN
1.0000 [drp] | Freq: Four times a day (QID) | OPHTHALMIC | Status: DC
  Administered 2012-07-08: 1 [drp] via OPHTHALMIC
  Filled 2012-07-07: qty 2.5

## 2012-07-07 MED ORDER — PANTOPRAZOLE SODIUM 40 MG PO TBEC
40.0000 mg | DELAYED_RELEASE_TABLET | Freq: Every day | ORAL | Status: DC
  Administered 2012-07-07: 40 mg via ORAL
  Filled 2012-07-07: qty 1

## 2012-07-07 SURGICAL SUPPLY — 61 items
ACCESSORY FRAGMATOME (MISCELLANEOUS) IMPLANT
APPLICATOR DR MATTHEWS STRL (MISCELLANEOUS) ×2 IMPLANT
BLADE EYE CATARACT 19 1.4 BEAV (BLADE) IMPLANT
BLADE MVR KNIFE 19G (BLADE) IMPLANT
BLADE MVR KNIFE 20G (BLADE) ×2 IMPLANT
CANNULA ANT CHAM MAIN (OPHTHALMIC RELATED) IMPLANT
CANNULA FLEX TIP 25G (CANNULA) ×2 IMPLANT
CANNULA SUBRETINAL FLUID 20G (BLADE) ×2 IMPLANT
CLOTH BEACON ORANGE TIMEOUT ST (SAFETY) ×2 IMPLANT
CORDS BIPOLAR (ELECTRODE) ×2 IMPLANT
COTTONBALL LRG STERILE PKG (GAUZE/BANDAGES/DRESSINGS) ×6 IMPLANT
COVER MAYO STAND STRL (DRAPES) IMPLANT
DRAPE OPHTHALMIC 77X100 STRL (CUSTOM PROCEDURE TRAY) ×2 IMPLANT
EAGLE VIT/RET MICRO PIC 168 25 (MISCELLANEOUS) IMPLANT
ERASER HMR WETFIELD 23G BP (MISCELLANEOUS) IMPLANT
FILTER BLUE MILLIPORE (MISCELLANEOUS) ×4 IMPLANT
FILTER STRAW FLUID ASPIR (MISCELLANEOUS) ×2 IMPLANT
GAS OPHTHALMIC (MISCELLANEOUS) ×2 IMPLANT
GLOVE SS BIOGEL STRL SZ 6.5 (GLOVE) ×1 IMPLANT
GLOVE SS BIOGEL STRL SZ 7 (GLOVE) ×1 IMPLANT
GLOVE SUPERSENSE BIOGEL SZ 6.5 (GLOVE) ×1
GLOVE SUPERSENSE BIOGEL SZ 7 (GLOVE) ×1
GLOVE SURG 8.5 LATEX PF (GLOVE) ×2 IMPLANT
GLOVE SURG SS PI 6.5 STRL IVOR (GLOVE) ×2 IMPLANT
GOWN STRL NON-REIN LRG LVL3 (GOWN DISPOSABLE) ×6 IMPLANT
ILLUMINATOR CHOW PICK 25GA (MISCELLANEOUS) ×2 IMPLANT
KIT BASIN OR (CUSTOM PROCEDURE TRAY) ×2 IMPLANT
KIT ROOM TURNOVER OR (KITS) ×2 IMPLANT
KNIFE CRESCENT 1.75 EDGEAHEAD (BLADE) ×2 IMPLANT
KNIFE GRIESHABER SHARP 2.5MM (MISCELLANEOUS) IMPLANT
MASK EYE SHIELD (GAUZE/BANDAGES/DRESSINGS) ×2 IMPLANT
NEEDLE 18GX1X1/2 (RX/OR ONLY) (NEEDLE) ×2 IMPLANT
NEEDLE 25GX 5/8IN NON SAFETY (NEEDLE) ×2 IMPLANT
NEEDLE 27GAX1X1/2 (NEEDLE) IMPLANT
NEEDLE HYPO 30X.5 LL (NEEDLE) ×4 IMPLANT
NS IRRIG 1000ML POUR BTL (IV SOLUTION) ×2 IMPLANT
PACK VITRECTOMY CUSTOM (CUSTOM PROCEDURE TRAY) ×2 IMPLANT
PAD ARMBOARD 7.5X6 YLW CONV (MISCELLANEOUS) ×4 IMPLANT
PAD EYE OVAL STERILE LF (GAUZE/BANDAGES/DRESSINGS) ×2 IMPLANT
PAK VITRECTOMY PIK 25 GA (OPHTHALMIC RELATED) ×2 IMPLANT
PROBE DIRECTIONAL LASER (MISCELLANEOUS) IMPLANT
REPL STRA BRUSH NEEDLE (NEEDLE) ×2 IMPLANT
RESERVOIR BACK FLUSH (MISCELLANEOUS) ×2 IMPLANT
ROLLS DENTAL (MISCELLANEOUS) ×4 IMPLANT
SCRAPER DIAMOND 25GA (OPHTHALMIC RELATED) IMPLANT
SCRAPER DIAMOND DUST MEMBRANE (MISCELLANEOUS) ×2 IMPLANT
SPONGE SURGIFOAM ABS GEL 12-7 (HEMOSTASIS) ×2 IMPLANT
STOPCOCK 4 WAY LG BORE MALE ST (IV SETS) ×2 IMPLANT
SUT CHROMIC 7 0 TG140 8 (SUTURE) IMPLANT
SUT ETHILON 9 0 TG140 8 (SUTURE) ×2 IMPLANT
SUT POLY NON ABSORB 10-0 8 STR (SUTURE) IMPLANT
SUT SILK 4 0 RB 1 (SUTURE) IMPLANT
SYR 20CC LL (SYRINGE) ×2 IMPLANT
SYR 50ML LL SCALE MARK (SYRINGE) ×2 IMPLANT
SYR 5ML LL (SYRINGE) IMPLANT
SYR BULB 3OZ (MISCELLANEOUS) ×2 IMPLANT
SYR TB 1ML LUER SLIP (SYRINGE) ×2 IMPLANT
SYRINGE 10CC LL (SYRINGE) ×2 IMPLANT
TOWEL OR 17X24 6PK STRL BLUE (TOWEL DISPOSABLE) ×6 IMPLANT
WATER STERILE IRR 1000ML POUR (IV SOLUTION) ×2 IMPLANT
WIPE INSTRUMENT VISIWIPE 73X73 (MISCELLANEOUS) ×2 IMPLANT

## 2012-07-07 NOTE — H&P (Signed)
I examined the patient today and there is no change in the medical status 

## 2012-07-07 NOTE — Brief Op Note (Signed)
07/07/2012  1:00 PM  PATIENT:  Sharrell Ku  77 y.o. male  PRE-OPERATIVE DIAGNOSIS:  MACULAR HOLE, left eye  POST-OPERATIVE DIAGNOSIS:  MACULAR HOLE, left eye  PROCEDURE:  Procedure(s) (LRB) with comments: 25 GAUGE PARS PLANA VITRECTOMY WITH 20 GAUGE MVR PORT FOR MACULAR HOLE (Left)  SURGEON:  Surgeon(s) and Role:    * Sherrie George, MD - Primary  Brief Operative note  Surgeon:  Sherrie George, MD...  Assistant:  Rosalie Doctor SA    Anesthesia: General  Specimen: none  Estimated blood loss:  1cc  Complications: none  Patient sent to PACU in good condition  Composed by Sherrie George MD  Dictation number: [phone]

## 2012-07-07 NOTE — H&P (Signed)
Bion Todorov is an 77 y.o. male.   Chief Complaint:loss of vision left eye HPI: 6 months of vision loss left eye  Past Medical History  Diagnosis Date  . Hypertension   . Diabetes mellitus without complication   . GERD (gastroesophageal reflux disease)   . Cancer     leukemia hx  . HLD (hyperlipidemia)     Past Surgical History  Procedure Date  . Eye surgery     laser surgery Left    History reviewed. No pertinent family history. Social History:  reports that he has never smoked. He does not have any smokeless tobacco history on file. He reports that he drinks alcohol. He reports that he does not use illicit drugs.  Allergies:  Allergies  Allergen Reactions  . Sulfur Rash    No prescriptions prior to admission    Review of systems otherwise negative  Height 5\' 7"  (1.702 m), weight 220 lb (99.791 kg).  Physical exam: Mental status: oriented x3. Eyes: See eye exam associated with this date of surgery in media tab.  Scanned in by scanning center Ears, Nose, Throat: within normal limits Neck: Within Normal limits General: within normal limits Chest: Within normal limits Breast: deferred Heart: Within normal limits Abdomen: Within normal limits GU: deferred Extremities: within normal limits Skin: within normal limits  Assessment/Plan Macular hole left eye Plan: To [facility] for Pars plana vitrectomy, laser treatment, serum patch, gas injection. Left eye.  Sherrie George 07/07/2012, 7:37 AM

## 2012-07-07 NOTE — Preoperative (Signed)
Beta Blockers   Reason not to administer Beta Blockers:Not Applicable 

## 2012-07-07 NOTE — Anesthesia Postprocedure Evaluation (Signed)
  Anesthesia Post-op Note  Patient: Alex Smith  Procedure(s) Performed: Procedure(s) (LRB) with comments: 25 GAUGE PARS PLANA VITRECTOMY WITH 20 GAUGE MVR PORT FOR MACULAR HOLE (Left) SERUM PATCH (Left) INSERTION OF GAS (Left) - C3F8  Patient Location: PACU  Anesthesia Type:General  Level of Consciousness: awake, alert , oriented and patient cooperative  Airway and Oxygen Therapy: Patient Spontanous Breathing and Patient connected to nasal cannula oxygen  Post-op Pain: mild  Post-op Assessment: Post-op Vital signs reviewed, Patient's Cardiovascular Status Stable, Respiratory Function Stable, Patent Airway, No signs of Nausea or vomiting and Pain level controlled  Post-op Vital Signs: stable  Complications: No apparent anesthesia complications

## 2012-07-07 NOTE — Anesthesia Preprocedure Evaluation (Addendum)
Anesthesia Evaluation  Patient identified by MRN, date of birth, ID band Patient awake    Reviewed: Allergy & Precautions, H&P , NPO status , Patient's Chart, lab work & pertinent test results, reviewed documented beta blocker date and time   Airway Mallampati: I TM Distance: >3 FB Neck ROM: full    Dental  (+) Teeth Intact, Caps and Dental Advisory Given   Pulmonary          Cardiovascular hypertension, Pt. on medications Rhythm:regular Rate:Normal     Neuro/Psych    GI/Hepatic GERD-  Medicated and Controlled,  Endo/Other  diabetes, Type 2, Oral Hypoglycemic Agents and Insulin Dependent  Renal/GU      Musculoskeletal   Abdominal   Peds  Hematology   Anesthesia Other Findings   Reproductive/Obstetrics                          Anesthesia Physical Anesthesia Plan  ASA: III  Anesthesia Plan: General   Post-op Pain Management:    Induction: Intravenous  Airway Management Planned: Oral ETT  Additional Equipment:   Intra-op Plan:   Post-operative Plan: Extubation in OR  Informed Consent: I have reviewed the patients History and Physical, chart, labs and discussed the procedure including the risks, benefits and alternatives for the proposed anesthesia with the patient or authorized representative who has indicated his/her understanding and acceptance.   Dental advisory given  Plan Discussed with: CRNA, Anesthesiologist and Surgeon  Anesthesia Plan Comments:        Anesthesia Quick Evaluation

## 2012-07-07 NOTE — Transfer of Care (Signed)
Immediate Anesthesia Transfer of Care Note  Patient: Alex Smith  Procedure(s) Performed: Procedure(s) (LRB) with comments: 25 GAUGE PARS PLANA VITRECTOMY WITH 20 GAUGE MVR PORT FOR MACULAR HOLE (Left) SERUM PATCH (Left) INSERTION OF GAS (Left) - C3F8  Patient Location: PACU  Anesthesia Type:General  Level of Consciousness: awake, alert  and oriented  Airway & Oxygen Therapy: Patient Spontanous Breathing and Patient connected to face mask oxygen  Post-op Assessment: Report given to PACU RN and Post -op Vital signs reviewed and stable  Post vital signs: Reviewed and stable  Complications: No apparent anesthesia complications

## 2012-07-08 LAB — GLUCOSE, CAPILLARY: Glucose-Capillary: 149 mg/dL — ABNORMAL HIGH (ref 70–99)

## 2012-07-08 MED ORDER — LATANOPROST 0.005 % OP SOLN: 1.0000 [drp] | Freq: Every day | OPHTHALMIC | Status: DC

## 2012-07-08 MED ORDER — BACITRACIN-POLYMYXIN B 500-10000 UNIT/GM OP OINT: 1.0000 "application " | TOPICAL_OINTMENT | Freq: Four times a day (QID) | OPHTHALMIC | Status: DC

## 2012-07-08 MED ORDER — BRIMONIDINE TARTRATE 0.2 % OP SOLN: 1.0000 [drp] | Freq: Two times a day (BID) | OPHTHALMIC | Status: DC

## 2012-07-08 MED ORDER — GATIFLOXACIN 0.5 % OP SOLN: 1.0000 [drp] | Freq: Four times a day (QID) | OPHTHALMIC | Status: DC

## 2012-07-08 MED ORDER — PREDNISOLONE ACETATE 1 % OP SUSP: 1.0000 [drp] | Freq: Four times a day (QID) | OPHTHALMIC | Status: DC

## 2012-07-08 NOTE — Progress Notes (Signed)
07/08/2012, 6:49 AM  Mental Status:  Awake, Alert, Oriented  Anterior segment: Cornea  Clear    Anterior Chamber Clear    Lens:    IOL  Intra Ocular Pressure 21 mmHg with Tonopen  Gas bubble 90%  Retina:  Attached Good laser reaction   Impression: Excellent result   Final Diagnosis: Principal Problem:  *Macular hole   Plan: start post operative eye drops.  Discharge to home.  Give post operative instructions  Sherrie George 07/08/2012, 6:49 AM

## 2012-07-08 NOTE — Discharge Summary (Signed)
Discharge summary not needed on [facility] patients per medical records. 

## 2012-07-08 NOTE — Op Note (Signed)
NAMEZINEDINE, Alex Smith                ACCOUNT NO.:  [id number]  MEDICAL RECORD NO.:  [id number]  LOCATION:  [facility]                         FACILITY:  [facility]  PHYSICIAN:  Beulah Gandy. Ashley Royalty, M.D. DATE OF BIRTH:  07/21/1935  DATE OF PROCEDURE:  07/07/2012 DATE OF DISCHARGE:                              OPERATIVE REPORT   ADMISSION DIAGNOSIS:  Macular hole, retinal break, left eye.  PROCEDURES:  Retinal photocoagulation for retinal break, repair of macular hole with pars plana vitrectomy, membrane peel, gas-fluid exchange, capsulectomy, serum patch all in the left eye.  SURGEON:  Beulah Gandy. Ashley Royalty, MD  ASSISTANT:  Rosalie Doctor, SA  ANESTHESIA:  General.  DETAILS:  Usual prep and drape, 25-gauge trocar was placed at 8 and 10. MVR 20-gauge opening at 2 o'clock.  The indirect ophthalmoscope laser was moved into place.  683 burns placed around the retinal periphery and around the retinal break in the upper temporal quadrant.  The power was 460 mW, 1000 microns each, and 0.1 seconds each.  The contact lens ring was anchored into place at 6 and 12 o'clock.  Provisc placed on the corneal surface and the flat contact lens placed.  Pars plana vitrectomy was begun just behind the cloudy posterior capsule.  The capsule was opened with the vitrector so that the view improved.  The vitrectomy was carried down to the macular surface in a core fashion.  Once the core vitrectomy was completed, the vitrectomy was carried to the mid periphery where some surface proliferation was removed.  The diamond- dusted membrane scraper was used to engage the internal limiting membrane and peel it from its attachments around the edge of the hole. The ILM was attached to the edge of the hole for 360 degrees.  Once the ILM was peeled, a 25-gauge Pick forceps were used to grasp the membrane and peel it from its attachment to the edge of the hole.  The vitrectomy was carried out into the far periphery with a 30-degree  prismatic lens, and the peripheral vitreous was removed.  A gas-fluid exchange was carried out.  Sufficient time was allowed for additional fluid to track down the walls of the eye and collect in the posterior segment.  A serum patch and C3F8 16% were prepared during this time.  [place] ophthalmics brush was used to remove all fluid from the posterior segment.  It was dried again after the serum patch was delivered.  C3F8 was injected to exchange for intravitreal gas.  The instruments were removed from the eye and 9-0 nylon was used to close the sclerotomy sites.  The conjunctiva was closed with wet-field cautery.  Polymyxin and gentamicin were irrigated into [place] space.  Atropine solution was applied.  Decadron 10 mg was injected into the lower subconjunctival space.  Closing pressure was 10 with a [place]raquer tonometer.  Polysporin ophthalmic ointment, a patch and shield were placed.  The patient was awakened and taken to recovery in satisfactory condition.     Beulah Gandy. Ashley Royalty, M.D.     JDM/MEDQ  D:  07/07/2012  T:  07/08/2012  Job:  [id number]

## 2012-07-08 NOTE — Progress Notes (Signed)
DC home with wife, verbally understood DC instructions and instructions given to him by Dr. Ashley Royalty. No questions asked

## 2012-07-09 ENCOUNTER — Encounter (HOSPITAL_COMMUNITY): Payer: Self-pay | Admitting: Ophthalmology

## 2012-07-09 LAB — GLUCOSE, CAPILLARY: Glucose-Capillary: 190 mg/dL — ABNORMAL HIGH (ref 70–99)

## 2012-07-14 ENCOUNTER — Inpatient Hospital Stay (INDEPENDENT_AMBULATORY_CARE_PROVIDER_SITE_OTHER): Payer: Medicare Other | Admitting: Ophthalmology

## 2012-07-14 DIAGNOSIS — H35349 Macular cyst, hole, or pseudohole, unspecified eye: Secondary | ICD-10-CM

## 2012-07-19 ENCOUNTER — Ambulatory Visit: Payer: Self-pay | Admitting: Internal Medicine

## 2012-08-04 ENCOUNTER — Encounter (INDEPENDENT_AMBULATORY_CARE_PROVIDER_SITE_OTHER): Payer: Medicare Other | Admitting: Ophthalmology

## 2012-08-04 DIAGNOSIS — H35349 Macular cyst, hole, or pseudohole, unspecified eye: Secondary | ICD-10-CM

## 2012-08-19 HISTORY — PX: COLONOSCOPY: SHX174

## 2012-09-23 ENCOUNTER — Ambulatory Visit: Payer: Self-pay | Admitting: Internal Medicine

## 2012-09-24 LAB — CBC CANCER CENTER
Basophil #: 0 x10 3/mm (ref 0.0–0.1)
Basophil %: 0.5 %
Eosinophil #: 0.1 x10 3/mm (ref 0.0–0.7)
Eosinophil %: 2.6 %
Lymphocyte #: 1.2 x10 3/mm (ref 1.0–3.6)
Lymphocyte %: 28.5 %
MCH: 36 pg — ABNORMAL HIGH (ref 26.0–34.0)
MCV: 103 fL — ABNORMAL HIGH (ref 80–100)
Neutrophil %: 61.9 %
RBC: 3.68 10*6/uL — ABNORMAL LOW (ref 4.40–5.90)
RDW: 14.3 % (ref 11.5–14.5)
WBC: 4.3 x10 3/mm (ref 3.8–10.6)

## 2012-09-24 LAB — CREATININE, SERUM: EGFR (Non-African Amer.): >60

## 2012-09-28 LAB — PROT IMMUNOELECTROPHORES(ARMC)

## 2012-10-13 ENCOUNTER — Encounter (INDEPENDENT_AMBULATORY_CARE_PROVIDER_SITE_OTHER): Payer: Medicare Other | Admitting: Ophthalmology

## 2012-10-17 ENCOUNTER — Ambulatory Visit: Payer: Self-pay | Admitting: Internal Medicine

## 2012-11-19 ENCOUNTER — Ambulatory Visit: Payer: Self-pay | Admitting: Internal Medicine

## 2012-11-19 LAB — CBC CANCER CENTER
Basophil #: 0 x10 3/mm (ref 0.0–0.1)
Eosinophil %: 1.9 %
HCT: 39.6 % — ABNORMAL LOW (ref 40.0–52.0)
Lymphocyte %: 30.9 %
MCH: 34.6 pg — ABNORMAL HIGH (ref 26.0–34.0)
MCV: 103 fL — ABNORMAL HIGH (ref 80–100)
Monocyte %: 5 %
Neutrophil %: 61.5 %
Platelet: 180 x10 3/mm (ref 150–440)
RDW: 14.4 % (ref 11.5–14.5)
WBC: 4.5 x10 3/mm (ref 3.8–10.6)

## 2012-11-19 LAB — CREATININE, SERUM
Creatinine: 1.18 mg/dL (ref 0.60–1.30)
EGFR (African American): >60

## 2012-12-17 ENCOUNTER — Ambulatory Visit: Payer: Self-pay | Admitting: Internal Medicine

## 2013-01-19 DIAGNOSIS — C44310 Basal cell carcinoma of skin of unspecified parts of face: Secondary | ICD-10-CM

## 2013-01-19 HISTORY — DX: Basal cell carcinoma of skin of unspecified parts of face: C44.310

## 2013-01-21 ENCOUNTER — Ambulatory Visit: Payer: Self-pay | Admitting: Internal Medicine

## 2013-01-21 LAB — CBC CANCER CENTER
Basophil #: 0 x10 3/mm (ref 0.0–0.1)
Eosinophil #: 0.1 x10 3/mm (ref 0.0–0.7)
HCT: 36.7 % — ABNORMAL LOW (ref 40.0–52.0)
HGB: 13 g/dL (ref 13.0–18.0)
MCHC: 35.4 g/dL (ref 32.0–36.0)
MCV: 103 fL — ABNORMAL HIGH (ref 80–100)
Monocyte #: 0.1 x10 3/mm — ABNORMAL LOW (ref 0.2–1.0)
Monocyte %: 5.6 %
Neutrophil #: 1.5 x10 3/mm (ref 1.4–6.5)
Platelet: 169 x10 3/mm (ref 150–440)
RBC: 3.55 10*6/uL — ABNORMAL LOW (ref 4.40–5.90)
RDW: 14.3 % (ref 11.5–14.5)

## 2013-01-21 LAB — CREATININE, SERUM: Creatinine: 1.05 mg/dL (ref 0.60–1.30)

## 2013-01-21 LAB — LACTATE DEHYDROGENASE: LDH: 183 U/L (ref 85–241)

## 2013-01-25 LAB — PROT IMMUNOELECTROPHORES(ARMC)

## 2013-01-28 LAB — CBC CANCER CENTER
Eosinophil #: 0.1 x10 3/mm (ref 0.0–0.7)
HCT: 38.4 % — ABNORMAL LOW (ref 40.0–52.0)
HGB: 13.5 g/dL (ref 13.0–18.0)
Lymphocyte #: 1.5 x10 3/mm (ref 1.0–3.6)
MCH: 36.4 pg — ABNORMAL HIGH (ref 26.0–34.0)
Monocyte #: 0.3 x10 3/mm (ref 0.2–1.0)
Monocyte %: 6.6 %
Neutrophil %: 53.1 %
Platelet: 185 x10 3/mm (ref 150–440)
RBC: 3.69 10*6/uL — ABNORMAL LOW (ref 4.40–5.90)
RDW: 14.3 % (ref 11.5–14.5)
WBC: 4.1 x10 3/mm (ref 3.8–10.6)

## 2013-02-16 ENCOUNTER — Ambulatory Visit: Payer: Self-pay | Admitting: Internal Medicine

## 2013-02-18 LAB — LIPID PANEL
Cholesterol: 159 mg/dL (ref 0–200)
HDL: 39 mg/dL (ref 35–70)
LDL (calc): 58.3
TRIGLYCERIDES: 307

## 2013-02-18 LAB — CBC
HEMOGLOBIN: 13 g/dL
PLATELET COUNT: 191
WBC: 3.9

## 2013-02-26 LAB — FOLATE: Folate: >22.3

## 2013-02-26 LAB — VITAMIN B12: Vitamin B12 Deficiency Panel: 265

## 2013-03-04 ENCOUNTER — Ambulatory Visit: Payer: Self-pay | Admitting: Unknown Physician Specialty

## 2013-03-08 LAB — PATHOLOGY REPORT

## 2013-03-18 LAB — CREATININE, SERUM: EGFR (African American): >60

## 2013-03-18 LAB — CBC CANCER CENTER
Basophil #: 0 x10 3/mm (ref 0.0–0.1)
Basophil %: 1.2 %
HCT: 36.5 % — ABNORMAL LOW (ref 40.0–52.0)
Lymphocyte #: 1.3 x10 3/mm (ref 1.0–3.6)
Lymphocyte %: 33.4 %
MCH: 36.5 pg — ABNORMAL HIGH (ref 26.0–34.0)
MCHC: 35.3 g/dL (ref 32.0–36.0)
MCV: 103 fL — ABNORMAL HIGH (ref 80–100)
Monocyte %: 5.4 %
Neutrophil #: 2.2 x10 3/mm (ref 1.4–6.5)
Neutrophil %: 58.1 %
Platelet: 181 x10 3/mm (ref 150–440)
WBC: 3.8 x10 3/mm (ref 3.8–10.6)

## 2013-03-18 LAB — LACTATE DEHYDROGENASE: LDH: 215 U/L (ref 85–241)

## 2013-03-19 ENCOUNTER — Ambulatory Visit: Payer: Self-pay | Admitting: Internal Medicine

## 2013-04-12 ENCOUNTER — Ambulatory Visit (INDEPENDENT_AMBULATORY_CARE_PROVIDER_SITE_OTHER): Payer: Medicare Other | Admitting: Ophthalmology

## 2013-04-12 DIAGNOSIS — H35039 Hypertensive retinopathy, unspecified eye: Secondary | ICD-10-CM

## 2013-04-12 DIAGNOSIS — E11319 Type 2 diabetes mellitus with unspecified diabetic retinopathy without macular edema: Secondary | ICD-10-CM

## 2013-04-12 DIAGNOSIS — I10 Essential (primary) hypertension: Secondary | ICD-10-CM

## 2013-04-12 DIAGNOSIS — H43819 Vitreous degeneration, unspecified eye: Secondary | ICD-10-CM

## 2013-04-12 DIAGNOSIS — E1139 Type 2 diabetes mellitus with other diabetic ophthalmic complication: Secondary | ICD-10-CM

## 2013-04-12 DIAGNOSIS — H35349 Macular cyst, hole, or pseudohole, unspecified eye: Secondary | ICD-10-CM

## 2013-05-13 ENCOUNTER — Ambulatory Visit: Payer: Self-pay | Admitting: Internal Medicine

## 2013-05-13 LAB — CREATININE, SERUM
Creatinine: 0.98 mg/dL (ref 0.60–1.30)
EGFR (Non-African Amer.): >60

## 2013-05-13 LAB — CBC CANCER CENTER
Basophil #: 0 x10 3/mm (ref 0.0–0.1)
Basophil %: 0.7 %
Eosinophil #: 0.1 x10 3/mm (ref 0.0–0.7)
HCT: 35.1 % — ABNORMAL LOW (ref 40.0–52.0)
Lymphocyte #: 1.1 x10 3/mm (ref 1.0–3.6)
Lymphocyte %: 32.9 %
MCH: 36.5 pg — ABNORMAL HIGH (ref 26.0–34.0)
MCV: 102 fL — ABNORMAL HIGH (ref 80–100)
Monocyte #: 0.2 x10 3/mm (ref 0.2–1.0)
Monocyte %: 5.9 %
Neutrophil #: 1.9 x10 3/mm (ref 1.4–6.5)
Platelet: 185 x10 3/mm (ref 150–440)
RBC: 3.44 10*6/uL — ABNORMAL LOW (ref 4.40–5.90)
RDW: 13.9 % (ref 11.5–14.5)

## 2013-05-13 LAB — LACTATE DEHYDROGENASE: LDH: 180 U/L (ref 85–241)

## 2013-05-14 LAB — PROT IMMUNOELECTROPHORES(ARMC)

## 2013-05-19 ENCOUNTER — Ambulatory Visit: Payer: Self-pay | Admitting: Internal Medicine

## 2013-06-10 LAB — IRON AND TIBC
Iron Saturation: 38 %
Iron: 129 ug/dL (ref 65–175)
Unbound Iron-Bind.Cap.: 215 ug/dL

## 2013-06-10 LAB — CBC CANCER CENTER
Basophil %: 0.6 %
Eosinophil #: 0.1 x10 3/mm (ref 0.0–0.7)
Eosinophil %: 2.4 %
HGB: 11.9 g/dL — ABNORMAL LOW (ref 13.0–18.0)
MCHC: 35.6 g/dL (ref 32.0–36.0)
MCV: 104 fL — ABNORMAL HIGH (ref 80–100)
Monocyte #: 0.2 x10 3/mm (ref 0.2–1.0)
Neutrophil %: 55 %
Platelet: 175 x10 3/mm (ref 150–440)
RDW: 14 % (ref 11.5–14.5)
WBC: 3.3 x10 3/mm — ABNORMAL LOW (ref 3.8–10.6)

## 2013-06-19 ENCOUNTER — Ambulatory Visit: Payer: Self-pay | Admitting: Internal Medicine

## 2013-07-08 LAB — CBC CANCER CENTER
Basophil %: 0.9 %
Eosinophil #: 0.1 x10 3/mm (ref 0.0–0.7)
Eosinophil %: 1.8 %
Lymphocyte %: 39.1 %
MCH: 35.5 pg — ABNORMAL HIGH (ref 26.0–34.0)
MCV: 105 fL — ABNORMAL HIGH (ref 80–100)
Platelet: 173 x10 3/mm (ref 150–440)
RDW: 14.3 % (ref 11.5–14.5)
WBC: 3.7 x10 3/mm — ABNORMAL LOW (ref 3.8–10.6)

## 2013-07-08 LAB — CREATININE, SERUM
Creatinine: 1.01 mg/dL (ref 0.60–1.30)
EGFR (Non-African Amer.): >60

## 2013-07-19 ENCOUNTER — Ambulatory Visit: Payer: Self-pay | Admitting: Internal Medicine

## 2013-08-20 LAB — COMPREHENSIVE METABOLIC PANEL
ALT: 17 U/L (ref 10–40)
AST: 16 U/L
Alkaline Phosphatase: 53 U/L
BILIRUBIN TOTAL: 0.6 mg/dL
Creat: 0.8
Glucose: 103

## 2013-08-20 LAB — TSH: TSH: 2.399

## 2013-08-20 LAB — HEMOGLOBIN A1C: A1C: 7

## 2013-09-09 ENCOUNTER — Ambulatory Visit: Payer: Self-pay | Admitting: Internal Medicine

## 2013-09-09 LAB — CBC CANCER CENTER
Basophil #: 0 x10 3/mm (ref 0.0–0.1)
Basophil %: 1.1 %
EOS PCT: 1.9 %
Eosinophil #: 0.1 x10 3/mm (ref 0.0–0.7)
HCT: 38.6 % — AB (ref 40.0–52.0)
HGB: 13.2 g/dL (ref 13.0–18.0)
LYMPHS ABS: 1.7 x10 3/mm (ref 1.0–3.6)
Lymphocyte %: 38.3 %
MCH: 35.4 pg — ABNORMAL HIGH (ref 26.0–34.0)
MCHC: 34.2 g/dL (ref 32.0–36.0)
MCV: 104 fL — ABNORMAL HIGH (ref 80–100)
Monocyte #: 0.2 x10 3/mm (ref 0.2–1.0)
Monocyte %: 3.9 %
NEUTROS ABS: 2.4 x10 3/mm (ref 1.4–6.5)
NEUTROS PCT: 54.8 %
Platelet: 179 x10 3/mm (ref 150–440)
RBC: 3.73 10*6/uL — ABNORMAL LOW (ref 4.40–5.90)
RDW: 14 % (ref 11.5–14.5)
WBC: 4.4 x10 3/mm (ref 3.8–10.6)

## 2013-09-09 LAB — LACTATE DEHYDROGENASE: LDH: 186 U/L (ref 85–241)

## 2013-09-09 LAB — CREATININE, SERUM
CREATININE: 1.18 mg/dL (ref 0.60–1.30)
EGFR (African American): >60
GFR CALC NON AF AMER: 60 — AB

## 2013-09-13 LAB — PROT IMMUNOELECTROPHORES(ARMC)

## 2013-09-19 ENCOUNTER — Ambulatory Visit: Payer: Self-pay | Admitting: Internal Medicine

## 2013-10-08 LAB — CBC CANCER CENTER
Basophil #: 0 x10 3/mm (ref 0.0–0.1)
Basophil %: 0.7 %
EOS ABS: 0.1 x10 3/mm (ref 0.0–0.7)
Eosinophil %: 3 %
HCT: 36.7 % — ABNORMAL LOW (ref 40.0–52.0)
HGB: 12.5 g/dL — ABNORMAL LOW (ref 13.0–18.0)
Lymphocyte #: 1.3 x10 3/mm (ref 1.0–3.6)
Lymphocyte %: 39.4 %
MCH: 35.3 pg — ABNORMAL HIGH (ref 26.0–34.0)
MCHC: 34 g/dL (ref 32.0–36.0)
MCV: 104 fL — ABNORMAL HIGH (ref 80–100)
MONO ABS: 0.2 x10 3/mm (ref 0.2–1.0)
MONOS PCT: 4.6 %
Neutrophil #: 1.7 x10 3/mm (ref 1.4–6.5)
Neutrophil %: 52.3 %
PLATELETS: 162 x10 3/mm (ref 150–440)
RBC: 3.54 10*6/uL — ABNORMAL LOW (ref 4.40–5.90)
RDW: 14.4 % (ref 11.5–14.5)
WBC: 3.3 x10 3/mm — ABNORMAL LOW (ref 3.8–10.6)

## 2013-10-08 LAB — CREATININE, SERUM
Creatinine: 1.09 mg/dL (ref 0.60–1.30)
EGFR (African American): >60

## 2013-10-08 LAB — LACTATE DEHYDROGENASE: LDH: 208 U/L (ref 85–241)

## 2013-10-17 ENCOUNTER — Ambulatory Visit: Payer: Self-pay | Admitting: Internal Medicine

## 2013-11-03 LAB — CBC CANCER CENTER
BASOS PCT: 0.8 %
Basophil #: 0 x10 3/mm (ref 0.0–0.1)
Eosinophil #: 0.1 x10 3/mm (ref 0.0–0.7)
Eosinophil %: 3.3 %
HCT: 37.2 % — ABNORMAL LOW (ref 40.0–52.0)
HGB: 12.6 g/dL — AB (ref 13.0–18.0)
Lymphocyte #: 1.2 x10 3/mm (ref 1.0–3.6)
Lymphocyte %: 38.7 %
MCH: 35.4 pg — ABNORMAL HIGH (ref 26.0–34.0)
MCHC: 33.7 g/dL (ref 32.0–36.0)
MCV: 105 fL — AB (ref 80–100)
Monocyte #: 0.1 x10 3/mm — ABNORMAL LOW (ref 0.2–1.0)
Monocyte %: 3.8 %
Neutrophil #: 1.7 x10 3/mm (ref 1.4–6.5)
Neutrophil %: 53.4 %
PLATELETS: 159 x10 3/mm (ref 150–440)
RBC: 3.55 10*6/uL — AB (ref 4.40–5.90)
RDW: 14.8 % — AB (ref 11.5–14.5)
WBC: 3.2 x10 3/mm — ABNORMAL LOW (ref 3.8–10.6)

## 2013-11-03 LAB — CREATININE, SERUM
Creatinine: 1.09 mg/dL (ref 0.60–1.30)
EGFR (African American): >60

## 2013-11-03 LAB — LACTATE DEHYDROGENASE: LDH: 188 U/L (ref 85–241)

## 2013-11-04 LAB — PROT IMMUNOELECTROPHORES(ARMC)

## 2013-11-17 ENCOUNTER — Ambulatory Visit: Payer: Self-pay | Admitting: Internal Medicine

## 2013-12-01 LAB — CBC CANCER CENTER
BASOS ABS: 0 x10 3/mm (ref 0.0–0.1)
Basophil %: 0.8 %
EOS PCT: 2.7 %
Eosinophil #: 0.1 x10 3/mm (ref 0.0–0.7)
HCT: 36.4 % — ABNORMAL LOW (ref 40.0–52.0)
HGB: 12.2 g/dL — ABNORMAL LOW (ref 13.0–18.0)
Lymphocyte #: 1.2 x10 3/mm (ref 1.0–3.6)
Lymphocyte %: 37.8 %
MCH: 35.4 pg — ABNORMAL HIGH (ref 26.0–34.0)
MCHC: 33.6 g/dL (ref 32.0–36.0)
MCV: 105 fL — ABNORMAL HIGH (ref 80–100)
MONO ABS: 0.1 x10 3/mm — AB (ref 0.2–1.0)
MONOS PCT: 3.1 %
NEUTROS ABS: 1.8 x10 3/mm (ref 1.4–6.5)
Neutrophil %: 55.6 %
Platelet: 179 x10 3/mm (ref 150–440)
RBC: 3.46 10*6/uL — ABNORMAL LOW (ref 4.40–5.90)
RDW: 14.7 % — AB (ref 11.5–14.5)
WBC: 3.2 x10 3/mm — ABNORMAL LOW (ref 3.8–10.6)

## 2013-12-01 LAB — URIC ACID: Uric Acid: 2.7 mg/dL — ABNORMAL LOW (ref 3.5–7.2)

## 2013-12-01 LAB — CREATININE, SERUM
Creatinine: 1.15 mg/dL (ref 0.60–1.30)
EGFR (Non-African Amer.): >60

## 2013-12-17 ENCOUNTER — Ambulatory Visit: Payer: Self-pay | Admitting: Internal Medicine

## 2013-12-29 LAB — CBC CANCER CENTER
BASOS ABS: 0 x10 3/mm (ref 0.0–0.1)
Basophil %: 0.8 %
EOS PCT: 2.7 %
Eosinophil #: 0.1 x10 3/mm (ref 0.0–0.7)
HCT: 35.9 % — ABNORMAL LOW (ref 40.0–52.0)
HGB: 12.4 g/dL — AB (ref 13.0–18.0)
Lymphocyte #: 1.3 x10 3/mm (ref 1.0–3.6)
Lymphocyte %: 43 %
MCH: 36.3 pg — AB (ref 26.0–34.0)
MCHC: 34.6 g/dL (ref 32.0–36.0)
MCV: 105 fL — ABNORMAL HIGH (ref 80–100)
MONO ABS: 0.1 x10 3/mm — AB (ref 0.2–1.0)
Monocyte %: 3.6 %
Neutrophil #: 1.5 x10 3/mm (ref 1.4–6.5)
Neutrophil %: 49.9 %
Platelet: 162 x10 3/mm (ref 150–440)
RBC: 3.42 10*6/uL — ABNORMAL LOW (ref 4.40–5.90)
RDW: 14.7 % — ABNORMAL HIGH (ref 11.5–14.5)
WBC: 3.1 x10 3/mm — ABNORMAL LOW (ref 3.8–10.6)

## 2013-12-29 LAB — LACTATE DEHYDROGENASE: LDH: 229 U/L (ref 85–241)

## 2013-12-29 LAB — URIC ACID: Uric Acid: 2.5 mg/dL — ABNORMAL LOW (ref 3.5–7.2)

## 2013-12-29 LAB — CREATININE, SERUM
CREATININE: 1.03 mg/dL (ref 0.60–1.30)
EGFR (Non-African Amer.): >60

## 2013-12-31 LAB — PROT IMMUNOELECTROPHORES(ARMC)

## 2014-01-14 ENCOUNTER — Ambulatory Visit (INDEPENDENT_AMBULATORY_CARE_PROVIDER_SITE_OTHER): Payer: Medicare Other | Admitting: Ophthalmology

## 2014-01-14 DIAGNOSIS — H35349 Macular cyst, hole, or pseudohole, unspecified eye: Secondary | ICD-10-CM

## 2014-01-14 DIAGNOSIS — H35379 Puckering of macula, unspecified eye: Secondary | ICD-10-CM

## 2014-01-14 DIAGNOSIS — H35039 Hypertensive retinopathy, unspecified eye: Secondary | ICD-10-CM

## 2014-01-14 DIAGNOSIS — E1165 Type 2 diabetes mellitus with hyperglycemia: Secondary | ICD-10-CM

## 2014-01-14 DIAGNOSIS — I10 Essential (primary) hypertension: Secondary | ICD-10-CM

## 2014-01-14 DIAGNOSIS — E1139 Type 2 diabetes mellitus with other diabetic ophthalmic complication: Secondary | ICD-10-CM

## 2014-01-14 DIAGNOSIS — H43819 Vitreous degeneration, unspecified eye: Secondary | ICD-10-CM

## 2014-01-14 DIAGNOSIS — E11319 Type 2 diabetes mellitus with unspecified diabetic retinopathy without macular edema: Secondary | ICD-10-CM

## 2014-01-17 ENCOUNTER — Ambulatory Visit: Payer: Self-pay | Admitting: Internal Medicine

## 2014-02-08 ENCOUNTER — Encounter: Payer: Self-pay | Admitting: Family Medicine

## 2014-02-08 ENCOUNTER — Ambulatory Visit (INDEPENDENT_AMBULATORY_CARE_PROVIDER_SITE_OTHER): Payer: Medicare Other | Admitting: Family Medicine

## 2014-02-08 VITALS — BP 170/70 | HR 80 | Temp 98.1°F | Ht 67.5 in | Wt 224.0 lb

## 2014-02-08 DIAGNOSIS — E11319 Type 2 diabetes mellitus with unspecified diabetic retinopathy without macular edema: Secondary | ICD-10-CM | POA: Insufficient documentation

## 2014-02-08 DIAGNOSIS — E11649 Type 2 diabetes mellitus with hypoglycemia without coma: Secondary | ICD-10-CM | POA: Insufficient documentation

## 2014-02-08 DIAGNOSIS — E119 Type 2 diabetes mellitus without complications: Secondary | ICD-10-CM

## 2014-02-08 DIAGNOSIS — N401 Enlarged prostate with lower urinary tract symptoms: Secondary | ICD-10-CM

## 2014-02-08 DIAGNOSIS — E785 Hyperlipidemia, unspecified: Secondary | ICD-10-CM

## 2014-02-08 DIAGNOSIS — N183 Chronic kidney disease, stage 3 unspecified: Secondary | ICD-10-CM | POA: Insufficient documentation

## 2014-02-08 DIAGNOSIS — E538 Deficiency of other specified B group vitamins: Secondary | ICD-10-CM

## 2014-02-08 DIAGNOSIS — K5790 Diverticulosis of intestine, part unspecified, without perforation or abscess without bleeding: Secondary | ICD-10-CM | POA: Insufficient documentation

## 2014-02-08 DIAGNOSIS — N400 Enlarged prostate without lower urinary tract symptoms: Secondary | ICD-10-CM

## 2014-02-08 DIAGNOSIS — I10 Essential (primary) hypertension: Secondary | ICD-10-CM

## 2014-02-08 DIAGNOSIS — N138 Other obstructive and reflux uropathy: Secondary | ICD-10-CM | POA: Insufficient documentation

## 2014-02-08 DIAGNOSIS — K219 Gastro-esophageal reflux disease without esophagitis: Secondary | ICD-10-CM

## 2014-02-08 MED ORDER — NIFEDIPINE ER OSMOTIC RELEASE 30 MG PO TB24: 30.0000 mg | ORAL_TABLET | Freq: Every day | ORAL | Status: DC

## 2014-02-08 NOTE — Assessment & Plan Note (Signed)
Chronic, stable. Continue regimen. Elevated today - pt will monitor at home and notify me if persistently elevated. Recheck at wellness exam in 2-3 mo

## 2014-02-08 NOTE — Progress Notes (Signed)
BP 170/70  Pulse 80  Temp(Src) 98.1 F (36.7 C) (Oral)  Ht 5' 7.5" (1.715 m)  Wt 224 lb (101.606 kg)  BMI 34.55 kg/m2   CC: new pt   Subjective:    Patient ID: Alex Smith, male    DOB: 1934/08/24, 79 y.o.   MRN: [id number]  HPI: Alex Smith is a 79 y.o. male presenting on 02/08/2014 for Mom[place] pt to establish. Prior saw Dr. Ola Spurr at [facility].  HTN - elevated today. States runs high at [employer] office. Doesn't check at home but has bp cuff.  DM - compliant with metformin 1000mg  bid, and novolog N and R. Fasting in am checks daily - 130-150. No low sugars or hypoglycemic sxs. No paresthesias. Last eye exam - a few months ago (Dr. Rodman Key).  HLD - compliant with pravstatin without myalgias.  Recent gross hematuria - s/p unrevealing eval by urology Bernardo Heater).  Hairy Cell Leukemia - followed by Dr. Cynda Acres at [facility] onc.  In remission since 2006. On chemo for 5 days.   Preventative: Colonoscopy 2014 - Dr. Tiffany Kocher at [place] Pneumovax 2006 Flu shot - yearly zostavax - has declined in past.  Married >50 yrs, cares for wife. 1 cat Occupation: was Art gallery manager, still works for [employer] Activity: likes to golf Diet: moderate water, fruits/vegetables daily  Relevant past medical, surgical, family and social history reviewed and updated as indicated.  Allergies and medications reviewed and updated. Current Outpatient Prescriptions on File Prior to Visit  Medication Sig  . acyclovir (ZOVIRAX) 400 MG tablet Take 1 tablet by mouth 2 (two) times daily. For leukemia  . lisinopril (PRINIVIL,ZESTRIL) 20 MG tablet Take 20 mg by mouth 2 (two) times daily.   Marland Kitchen NOVOLIN N RELION 100 UNIT/ML injection Inject 32 Units as directed Twice daily.   Marland Kitchen NOVOLIN R RELION 100 UNIT/ML injection Inject 10 Units as directed 2 (two) times daily before a meal.   . omeprazole (PRILOSEC) 20 MG capsule Take 20 mg by mouth daily.  . pravastatin (PRAVACHOL) 20 MG tablet Take 1 tablet  by mouth at bedtime.  . vitamin E 400 UNIT capsule Take 400 Units by mouth daily.   No current facility-administered medications on file prior to visit.    Review of Systems Per HPI unless specifically indicated above    Objective:    BP 170/70  Pulse 80  Temp(Src) 98.1 F (36.7 C) (Oral)  Ht 5' 7.5" (1.715 m)  Wt 224 lb (101.606 kg)  BMI 34.55 kg/m2  Physical Exam  Nursing note and vitals reviewed. Constitutional: He is oriented to person, place, and time. He appears well-developed and well-nourished. No distress.  HENT:  Head: Normocephalic and atraumatic.  Right Ear: Hearing, tympanic membrane, external ear and ear canal normal.  Left Ear: Hearing, tympanic membrane, external ear and ear canal normal.  Nose: Nose normal.  Mouth/Throat: Uvula is midline, oropharynx is clear and moist and mucous membranes are normal. No oropharyngeal exudate, posterior oropharyngeal edema or posterior oropharyngeal erythema.  Eyes: Conjunctivae and EOM are normal. Pupils are equal, round, and reactive to light. No scleral icterus.  Neck: Normal range of motion. Neck supple. No thyromegaly present.  Cardiovascular: Normal rate, regular rhythm and intact distal pulses.   Murmur (2/6 SEM) heard. Pulses:      Radial pulses are 2+ on the right side, and 2+ on the left side.  Pulmonary/Chest: Effort normal and breath sounds normal. No respiratory distress. He has  no wheezes. He has no rales.  Musculoskeletal: Normal range of motion. He exhibits no edema.  Diabetic foot exam: Normal inspection No skin breakdown No calluses  Normal DP pulses Normal sensation to light touch and monofilament Nails normal  Lymphadenopathy:    He has no cervical adenopathy.  Neurological: He is alert and oriented to person, place, and time.  CN grossly intact, station and gait intact  Skin: Skin is warm and dry. No rash noted.  Psychiatric: He has a normal mood and affect. His behavior is normal. Judgment and  thought content normal.       Assessment & Plan:   Problem List Items Addressed This Visit   Type II diabetes mellitus     Chronic, stable. Continue regimen. Check A1c next fasting blood work in 2-3 mo.    Relevant Medications      metFORMIN (GLUCOPHAGE) 1000 MG tablet   Hypertension     Chronic, stable. Continue regimen. Elevated today - pt will monitor at home and notify me if persistently elevated. Recheck at wellness exam in 2-3 mo    Relevant Medications      NIFEdipine (PROCARDIA-XL/ADALAT-CC/NIFEDICAL-XL) 24 hr tablet   HLD (hyperlipidemia)     Tolerating pravastatin. Check FLP when returns for fasting blood work.    Relevant Medications      NIFEdipine (PROCARDIA-XL/ADALAT-CC/NIFEDICAL-XL) 24 hr tablet   GERD (gastroesophageal reflux disease)     Stable on PPI. Continue med.    BPH (benign prostatic hypertrophy)     Followed by urology. asxs.    B12 deficiency - Primary     Check when returns for labwork prior to wellness exam.        Follow up plan: Return in about 3 months (around 05/11/2014), or as needed, for annual exam, prior fasting for blood work.

## 2014-02-08 NOTE — Patient Instructions (Addendum)
Start checking blood pressure at home, let me know if running high (>140/90) for a change in bp meds. Return to see me in 2 months for wellness exam ,prior fasting for blood work. Good to meet you today, call us with questions. I've refilled nifedipine (3 mo supply this time)

## 2014-02-08 NOTE — Assessment & Plan Note (Signed)
Chronic, stable. Continue regimen. Check A1c next fasting blood work in 2-3 mo.

## 2014-02-08 NOTE — Assessment & Plan Note (Signed)
Check when returns for labwork prior to wellness exam.

## 2014-02-08 NOTE — Assessment & Plan Note (Signed)
Tolerating pravastatin. Check FLP when returns for fasting blood work.

## 2014-02-08 NOTE — Assessment & Plan Note (Signed)
Followed by urology. asxs.

## 2014-02-08 NOTE — Assessment & Plan Note (Signed)
Stable on PPI. Continue med.

## 2014-02-09 ENCOUNTER — Telehealth: Payer: Self-pay | Admitting: Family Medicine

## 2014-02-09 ENCOUNTER — Encounter: Payer: Self-pay | Admitting: *Deleted

## 2014-02-09 NOTE — Telephone Encounter (Signed)
Relevant patient education mailed to patient.  

## 2014-02-14 LAB — URIC ACID: Uric Acid: 2.9 mg/dL — ABNORMAL LOW (ref 3.5–7.2)

## 2014-02-14 LAB — CBC CANCER CENTER
Basophil #: 0 x10 3/mm (ref 0.0–0.1)
Basophil %: 0.7 %
EOS PCT: 2.5 %
Eosinophil #: 0.1 x10 3/mm (ref 0.0–0.7)
HCT: 36.5 % — ABNORMAL LOW (ref 40.0–52.0)
HGB: 12.3 g/dL — ABNORMAL LOW (ref 13.0–18.0)
LYMPHS ABS: 1.2 x10 3/mm (ref 1.0–3.6)
Lymphocyte %: 38.5 %
MCH: 36.1 pg — ABNORMAL HIGH (ref 26.0–34.0)
MCHC: 33.7 g/dL (ref 32.0–36.0)
MCV: 107 fL — AB (ref 80–100)
Monocyte #: 0.1 x10 3/mm — ABNORMAL LOW (ref 0.2–1.0)
Monocyte %: 3.8 %
NEUTROS ABS: 1.7 x10 3/mm (ref 1.4–6.5)
Neutrophil %: 54.5 %
Platelet: 174 x10 3/mm (ref 150–440)
RBC: 3.41 10*6/uL — AB (ref 4.40–5.90)
RDW: 14.5 % (ref 11.5–14.5)
WBC: 3.1 x10 3/mm — AB (ref 3.8–10.6)

## 2014-02-14 LAB — CREATININE, SERUM
CREATININE: 1.17 mg/dL (ref 0.60–1.30)
EGFR (African American): >60
EGFR (Non-African Amer.): 60 — ABNORMAL LOW

## 2014-02-14 LAB — LACTATE DEHYDROGENASE: LDH: 214 U/L (ref 85–241)

## 2014-02-15 ENCOUNTER — Other Ambulatory Visit: Payer: Self-pay | Admitting: *Deleted

## 2014-02-15 MED ORDER — LISINOPRIL 20 MG PO TABS: 20.0000 mg | ORAL_TABLET | Freq: Two times a day (BID) | ORAL | Status: DC

## 2014-02-16 ENCOUNTER — Ambulatory Visit: Payer: Self-pay | Admitting: Internal Medicine

## 2014-02-16 LAB — PROT IMMUNOELECTROPHORES(ARMC)

## 2014-03-02 LAB — HM DIABETES EYE EXAM

## 2014-03-28 ENCOUNTER — Ambulatory Visit: Payer: Self-pay | Admitting: Internal Medicine

## 2014-03-28 LAB — CBC CANCER CENTER
BASOS ABS: 0 x10 3/mm (ref 0.0–0.1)
Basophil %: 0.9 %
EOS ABS: 0.1 x10 3/mm (ref 0.0–0.7)
Eosinophil %: 2.8 %
HCT: 35.2 % — ABNORMAL LOW (ref 40.0–52.0)
HGB: 12 g/dL — ABNORMAL LOW (ref 13.0–18.0)
Lymphocyte #: 1.1 x10 3/mm (ref 1.0–3.6)
Lymphocyte %: 40.1 %
MCH: 36.4 pg — ABNORMAL HIGH (ref 26.0–34.0)
MCHC: 34.1 g/dL (ref 32.0–36.0)
MCV: 107 fL — ABNORMAL HIGH (ref 80–100)
MONO ABS: 0.1 x10 3/mm — AB (ref 0.2–1.0)
Monocyte %: 3.5 %
NEUTROS ABS: 1.4 x10 3/mm (ref 1.4–6.5)
Neutrophil %: 52.7 %
Platelet: 160 x10 3/mm (ref 150–440)
RBC: 3.3 10*6/uL — ABNORMAL LOW (ref 4.40–5.90)
RDW: 14.8 % — AB (ref 11.5–14.5)
WBC: 2.6 x10 3/mm — ABNORMAL LOW (ref 3.8–10.6)

## 2014-03-28 LAB — CREATININE, SERUM
CREATININE: 1.08 mg/dL (ref 0.60–1.30)
EGFR (African American): >60

## 2014-03-29 LAB — PROT IMMUNOELECTROPHORES(ARMC)

## 2014-03-30 ENCOUNTER — Other Ambulatory Visit: Payer: Self-pay | Admitting: Family Medicine

## 2014-03-31 ENCOUNTER — Other Ambulatory Visit: Payer: Self-pay | Admitting: *Deleted

## 2014-03-31 MED ORDER — PRAVASTATIN SODIUM 20 MG PO TABS: 20.0000 mg | ORAL_TABLET | Freq: Every day | ORAL | Status: DC

## 2014-04-01 ENCOUNTER — Other Ambulatory Visit: Payer: Self-pay | Admitting: Family Medicine

## 2014-04-01 DIAGNOSIS — I10 Essential (primary) hypertension: Secondary | ICD-10-CM

## 2014-04-01 DIAGNOSIS — E119 Type 2 diabetes mellitus without complications: Secondary | ICD-10-CM

## 2014-04-01 DIAGNOSIS — N400 Enlarged prostate without lower urinary tract symptoms: Secondary | ICD-10-CM

## 2014-04-01 DIAGNOSIS — E538 Deficiency of other specified B group vitamins: Secondary | ICD-10-CM

## 2014-04-01 DIAGNOSIS — E785 Hyperlipidemia, unspecified: Secondary | ICD-10-CM

## 2014-04-04 ENCOUNTER — Encounter (INDEPENDENT_AMBULATORY_CARE_PROVIDER_SITE_OTHER): Payer: Self-pay

## 2014-04-04 ENCOUNTER — Other Ambulatory Visit (INDEPENDENT_AMBULATORY_CARE_PROVIDER_SITE_OTHER): Payer: Medicare Other

## 2014-04-04 DIAGNOSIS — E538 Deficiency of other specified B group vitamins: Secondary | ICD-10-CM

## 2014-04-04 DIAGNOSIS — I10 Essential (primary) hypertension: Secondary | ICD-10-CM

## 2014-04-04 DIAGNOSIS — E785 Hyperlipidemia, unspecified: Secondary | ICD-10-CM

## 2014-04-04 DIAGNOSIS — E119 Type 2 diabetes mellitus without complications: Secondary | ICD-10-CM

## 2014-04-04 LAB — VITAMIN B12: VITAMIN B 12: 586 pg/mL (ref 211–911)

## 2014-04-04 LAB — HEMOGLOBIN A1C: HEMOGLOBIN A1C: 7.5 % — AB (ref 4.6–6.5)

## 2014-04-05 LAB — LIPID PANEL
CHOL/HDL RATIO: 4
Cholesterol: 146 mg/dL (ref 0–200)
HDL: 35.5 mg/dL — AB (ref >39.00)
NONHDL: 110.5
TRIGLYCERIDES: 231 mg/dL — AB (ref 0.0–149.0)
VLDL: 46.2 mg/dL — ABNORMAL HIGH (ref 0.0–40.0)

## 2014-04-05 LAB — BASIC METABOLIC PANEL
BUN: 19 mg/dL (ref 6–23)
CO2: 25 mEq/L (ref 19–32)
CREATININE: 1 mg/dL (ref 0.4–1.5)
Calcium: 8.9 mg/dL (ref 8.4–10.5)
Chloride: 108 mEq/L (ref 96–112)
GFR: 79.68 mL/min (ref >60.00)
Glucose, Bld: 160 mg/dL — ABNORMAL HIGH (ref 70–99)
POTASSIUM: 4.4 meq/L (ref 3.5–5.1)
Sodium: 142 mEq/L (ref 135–145)

## 2014-04-05 LAB — LDL CHOLESTEROL, DIRECT: Direct LDL: 83.6 mg/dL

## 2014-04-06 ENCOUNTER — Other Ambulatory Visit: Payer: Self-pay | Admitting: *Deleted

## 2014-04-06 MED ORDER — PRAVASTATIN SODIUM 20 MG PO TABS: 20.0000 mg | ORAL_TABLET | Freq: Every day | ORAL | Status: DC

## 2014-04-12 ENCOUNTER — Ambulatory Visit (INDEPENDENT_AMBULATORY_CARE_PROVIDER_SITE_OTHER): Payer: Medicare Other | Admitting: Family Medicine

## 2014-04-12 ENCOUNTER — Encounter: Payer: Self-pay | Admitting: Family Medicine

## 2014-04-12 VITALS — BP 140/76 | HR 96 | Temp 98.1°F | Ht 67.75 in | Wt 224.0 lb

## 2014-04-12 DIAGNOSIS — E538 Deficiency of other specified B group vitamins: Secondary | ICD-10-CM

## 2014-04-12 DIAGNOSIS — I10 Essential (primary) hypertension: Secondary | ICD-10-CM

## 2014-04-12 DIAGNOSIS — E119 Type 2 diabetes mellitus without complications: Secondary | ICD-10-CM

## 2014-04-12 DIAGNOSIS — Z0000 Encounter for general adult medical examination without abnormal findings: Secondary | ICD-10-CM

## 2014-04-12 DIAGNOSIS — E785 Hyperlipidemia, unspecified: Secondary | ICD-10-CM

## 2014-04-12 DIAGNOSIS — C9141 Hairy cell leukemia, in remission: Secondary | ICD-10-CM | POA: Insufficient documentation

## 2014-04-12 DIAGNOSIS — Z23 Encounter for immunization: Secondary | ICD-10-CM

## 2014-04-12 DIAGNOSIS — N400 Enlarged prostate without lower urinary tract symptoms: Secondary | ICD-10-CM

## 2014-04-12 DIAGNOSIS — C9140 Hairy cell leukemia not having achieved remission: Secondary | ICD-10-CM

## 2014-04-12 NOTE — Assessment & Plan Note (Signed)
Preventative protocols reviewed and updated unless pt declined. Discussed healthy diet and lifestyle.  

## 2014-04-12 NOTE — Progress Notes (Signed)
Pre visit review using our clinic review tool, if applicable. No additional management support is needed unless otherwise documented below in the visit note. 

## 2014-04-12 NOTE — Assessment & Plan Note (Signed)
On recheck bp remains stable. Continue lisinopril 20mg  bid. I asked him to buy large cuff to use at home (he thinks he's using too small a cuff) and if persistently elevated readings at home to bring in bp cuff to next office visit to compare to our readings. Pt agrees with plan

## 2014-04-12 NOTE — Assessment & Plan Note (Signed)
Chronic, overall stable for him. No changes made today. Lab Results  Component Value Date   HGBA1C 7.5* 04/04/2014

## 2014-04-12 NOTE — Addendum Note (Signed)
Addended by: Ria Bush on: 04/12/2014 04:17 PM   Modules accepted: Level of Service

## 2014-04-12 NOTE — Assessment & Plan Note (Signed)
I have personally reviewed the Medicare Annual Wellness questionnaire and have noted 1. The patient's medical and social history 2. Their use of alcohol, tobacco or illicit drugs 3. Their current medications and supplements 4. The patient's functional ability including ADL's, fall risks, home safety risks and hearing or visual impairment. 5. Diet and physical activity 6. Evidence for depression or mood disorders The patients weight, height, BMI have been recorded in the chart.  Hearing and vision has been addressed. I have made referrals, counseling and provided education to the patient based review of the above and I have provided the pt with a written personalized care plan for preventive services. Provider list updated - see scanned questionairre. Advanced directives discussed: packet provided today.  Reviewed preventative protocols and updated unless pt declined.

## 2014-04-12 NOTE — Addendum Note (Signed)
Addended by: Royann Shivers A on: 04/12/2014 04:48 PM   Modules accepted: Orders

## 2014-04-12 NOTE — Patient Instructions (Addendum)
prevnar today (pneumonia shot) Return next month for a flu shot. Advanced directive packet provided today. Good to see you today, call us with questions. Return as needed or in 6 months for diabetes follow up.

## 2014-04-12 NOTE — Assessment & Plan Note (Signed)
Chronic, stable. Released by urology. No need for further eval unless sxs develop.

## 2014-04-12 NOTE — Assessment & Plan Note (Signed)
b12 level back to normal. Continue oral supplementation

## 2014-04-12 NOTE — Assessment & Plan Note (Signed)
Chronic, LDL at goal on pravastatin. Trig too high, hdl too low. Reviewed lifestyle changes to improve these numbers.

## 2014-04-12 NOTE — Progress Notes (Signed)
BP 140/70  Pulse 96  Temp(Src) 98.1 F (36.7 C) (Oral)  Ht 5' 7.75" (1.721 m)  Wt 224 lb (101.606 kg)  BMI 34.31 kg/m2   CC: AMW  Subjective:    Patient ID: Alex Smith, male    DOB: 1935-04-16, 79 y.o.   MRN: [id number]  HPI: Alex Smith is a 79 y.o. male presenting on 04/12/2014 for Annual Exam   HTN - brings log from home - all consistently elevated 153-197/60-80s.   Hairy Cell Leukemia - followed by Dr. Cynda Acres at [facility] onc. In remission since 2006. On chemo for 5 years. Pt endorses leukopenia on recent labwork at [facility] center, due for f/u in next 1-2 wks.  Passes hearing Recent eye exam 2 falls in last year - tripped x1 and fell at work overreaching. No premonitory sxs Some depression. Overall stable  Preventative:  Colonoscopy 2014 - Dr. Tiffany Kocher at [facility]. Told didn't need colonoscopy repeated Prostate cancer screening - aged out. Released from Dr. Dene Gentry care as he was doing well. Endorses stable CT scan recently, done for hematuria. Td 1997  Pneumovax 2006, prevnar today. Flu shot - yearly zostavax - has declined in past. Currently on acyclovir.  Advanced directives: discussed, has not set up. Requests packet today.   Married >50 yrs, cares for wife. 1 cat  Occupation: was Art gallery manager, still works for [employer]  Activity: likes to golf  Diet: moderate water, fruits/vegetables daily   Relevant past medical, surgical, family and social history reviewed and updated as indicated.  Allergies and medications reviewed and updated. Current Outpatient Prescriptions on File Prior to Visit  Medication Sig  . acyclovir (ZOVIRAX) 400 MG tablet Take 1 tablet by mouth 2 (two) times daily. For leukemia  . lisinopril (PRINIVIL,ZESTRIL) 20 MG tablet Take 1 tablet (20 mg total) by mouth 2 (two) times daily.  . metFORMIN (GLUCOPHAGE) 1000 MG tablet Take 1 tablet by mouth 2 (two) times daily.  Marland Kitchen NIFEdipine (PROCARDIA-XL/ADALAT-CC/NIFEDICAL-XL) 30 MG 24 hr tablet Take  1 tablet (30 mg total) by mouth daily.  Marland Kitchen NOVOLIN N 100 UNIT/ML injection INJECT 32 UNITS SUBCUTANEOUSLY TWICE DAILY  . NOVOLIN R RELION 100 UNIT/ML injection Inject 10 Units as directed 2 (two) times daily before a meal.   . omeprazole (PRILOSEC) 20 MG capsule Take 20 mg by mouth daily.  . pravastatin (PRAVACHOL) 20 MG tablet Take 1 tablet (20 mg total) by mouth at bedtime.  . vitamin B-12 (CYANOCOBALAMIN) 1000 MCG tablet Take 1,000 mcg by mouth daily.  . vitamin E 400 UNIT capsule Take 400 Units by mouth daily.   No current facility-administered medications on file prior to visit.    Review of Systems  Constitutional: Negative for fever, chills, activity change, appetite change, fatigue and unexpected weight change.  HENT: Negative for hearing loss.   Eyes: Negative for visual disturbance.  Respiratory: Negative for cough, chest tightness, shortness of breath and wheezing.   Cardiovascular: Negative for chest pain, palpitations and leg swelling.  Gastrointestinal: Negative for nausea, vomiting, abdominal pain, diarrhea, constipation, blood in stool and abdominal distention.  Genitourinary: Negative for hematuria and difficulty urinating.  Musculoskeletal: Negative for arthralgias, myalgias and neck pain.  Skin: Negative for rash.  Neurological: Negative for dizziness, seizures, syncope and headaches.  Hematological: Negative for adenopathy. Does not bruise/bleed easily.  Psychiatric/Behavioral: Negative for dysphoric mood. The patient is not nervous/anxious.    Per HPI unless specifically indicated above    Objective:    BP 140/70  Pulse 96  Temp(Src) 98.1 F (36.7 C) (Oral)  Ht 5' 7.75" (1.721 m)  Wt 224 lb (101.606 kg)  BMI 34.31 kg/m2  Physical Exam  Nursing note and vitals reviewed. Constitutional: He is oriented to person, place, and time. He appears well-developed and well-nourished. No distress.  Obese Body mass index is 34.31 kg/(m^2).  HENT:  Head: Normocephalic  and atraumatic.  Right Ear: Hearing, tympanic membrane, external ear and ear canal normal.  Left Ear: Hearing, tympanic membrane, external ear and ear canal normal.  Nose: Nose normal.  Mouth/Throat: Uvula is midline, oropharynx is clear and moist and mucous membranes are normal. No oropharyngeal exudate, posterior oropharyngeal edema or posterior oropharyngeal erythema.  Eyes: Conjunctivae and EOM are normal. Pupils are equal, round, and reactive to light. No scleral icterus.  Neck: Normal range of motion. Neck supple. Carotid bruit is not present. No thyromegaly present.  Cardiovascular: Normal rate, regular rhythm and intact distal pulses.   Murmur (2/6 SEM best at [place]) heard. Pulses:      Radial pulses are 2+ on the right side, and 2+ on the left side.  Pulmonary/Chest: Effort normal and breath sounds normal. No respiratory distress. He has no wheezes. He has no rales.  Abdominal: Soft. Bowel sounds are normal. He exhibits no distension and no mass. There is no tenderness. There is no rebound and no guarding.  Musculoskeletal: Normal range of motion. He exhibits no edema.  Lymphadenopathy:    He has no cervical adenopathy.  Neurological: He is alert and oriented to person, place, and time.  CN grossly intact, station and gait intact Recall 3/3  Calculation 5/5 serial 3s  Skin: Skin is warm and dry. No rash noted.  Psychiatric: He has a normal mood and affect. His behavior is normal. Judgment and thought content normal.   Results for orders placed in visit on 04/12/14  HM DIABETES EYE EXAM      Result Value Ref Range   HM Diabetic Eye Exam No Retinopathy  No Retinopathy      Assessment & Plan:   Problem List Items Addressed This Visit   B12 deficiency     b12 level back to normal. Continue oral supplementation    BPH (benign prostatic hypertrophy)     Chronic, stable. Released by urology. No need for further eval unless sxs develop.    Relevant Medications      tamsulosin  (FLOMAX) 0.4 MG CAPS capsule   Dyslipidemia     Chronic, LDL at goal on pravastatin. Trig too high, hdl too low. Reviewed lifestyle changes to improve these numbers.    Hairy cell leukemia     Followed by onc at [facility].    Health maintenance examination     Preventative protocols reviewed and updated unless pt declined. Discussed healthy diet and lifestyle.     Hypertension     On recheck bp remains stable. Continue lisinopril 20mg  bid. I asked him to buy large cuff to use at home (he thinks he's using too small a cuff) and if persistently elevated readings at home to bring in bp cuff to next office visit to compare to our readings. Pt agrees with plan    Medicare annual wellness visit, subsequent - Primary     I have personally reviewed the Medicare Annual Wellness questionnaire and have noted 1. The patient's medical and social history 2. Their use of alcohol, tobacco or illicit drugs 3. Their current medications and supplements 4. The patient's functional ability including ADL's,  fall risks, home safety risks and hearing or visual impairment. 5. Diet and physical activity 6. Evidence for depression or mood disorders The patients weight, height, BMI have been recorded in the chart.  Hearing and vision has been addressed. I have made referrals, counseling and provided education to the patient based review of the above and I have provided the pt with a written personalized care plan for preventive services. Provider list updated - see scanned questionairre. Advanced directives discussed: packet provided today.  Reviewed preventative protocols and updated unless pt declined.    Type II diabetes mellitus      Chronic, overall stable for him. No changes made today. Lab Results  Component Value Date   HGBA1C 7.5* 04/04/2014          Follow up plan: Return in about 6 months (around 10/13/2014), or as needed, for follow up visit.

## 2014-04-12 NOTE — Assessment & Plan Note (Signed)
Followed by onc at [facility] 

## 2014-04-19 ENCOUNTER — Ambulatory Visit: Payer: Self-pay | Admitting: Internal Medicine

## 2014-04-26 ENCOUNTER — Other Ambulatory Visit: Payer: Self-pay | Admitting: Family Medicine

## 2014-05-09 LAB — CBC CANCER CENTER
Basophil #: 0 x10 3/mm (ref 0.0–0.1)
Basophil %: 0.8 %
EOS ABS: 0.1 x10 3/mm (ref 0.0–0.7)
Eosinophil %: 2.3 %
HCT: 34.8 % — ABNORMAL LOW (ref 40.0–52.0)
HGB: 11.6 g/dL — ABNORMAL LOW (ref 13.0–18.0)
LYMPHS PCT: 43.2 %
Lymphocyte #: 1 x10 3/mm (ref 1.0–3.6)
MCH: 35.7 pg — AB (ref 26.0–34.0)
MCHC: 33.2 g/dL (ref 32.0–36.0)
MCV: 108 fL — AB (ref 80–100)
Monocyte #: 0.1 x10 3/mm — ABNORMAL LOW (ref 0.2–1.0)
Monocyte %: 3.7 %
NEUTROS PCT: 50 %
Neutrophil #: 1.2 x10 3/mm — ABNORMAL LOW (ref 1.4–6.5)
Platelet: 153 x10 3/mm (ref 150–440)
RBC: 3.24 10*6/uL — ABNORMAL LOW (ref 4.40–5.90)
RDW: 14.4 % (ref 11.5–14.5)
WBC: 2.4 x10 3/mm — ABNORMAL LOW (ref 3.8–10.6)

## 2014-05-09 LAB — CREATININE, SERUM
CREATININE: 1.17 mg/dL (ref 0.60–1.30)
GFR CALC NON AF AMER: 60 — AB

## 2014-05-12 LAB — PROT IMMUNOELECTROPHORES(ARMC)

## 2014-05-19 ENCOUNTER — Ambulatory Visit: Payer: Self-pay | Admitting: Internal Medicine

## 2014-05-23 LAB — IRON AND TIBC
IRON: 129 ug/dL (ref 65–175)
Iron Bind.Cap.(Total): 375 ug/dL (ref 250–450)
Iron Saturation: 34 %
UNBOUND IRON-BIND. CAP.: 246 ug/dL

## 2014-05-23 LAB — CBC CANCER CENTER
BASOS PCT: 0.8 %
Basophil #: 0 x10 3/mm (ref 0.0–0.1)
Eosinophil #: 0.1 x10 3/mm (ref 0.0–0.7)
Eosinophil %: 2.1 %
HCT: 35.2 % — ABNORMAL LOW (ref 40.0–52.0)
HGB: 11.9 g/dL — ABNORMAL LOW (ref 13.0–18.0)
Lymphocyte #: 1 x10 3/mm (ref 1.0–3.6)
Lymphocyte %: 41.4 %
MCH: 36.1 pg — AB (ref 26.0–34.0)
MCHC: 33.9 g/dL (ref 32.0–36.0)
MCV: 107 fL — ABNORMAL HIGH (ref 80–100)
MONOS PCT: 3.2 %
Monocyte #: 0.1 x10 3/mm — ABNORMAL LOW (ref 0.2–1.0)
NEUTROS PCT: 52.5 %
Neutrophil #: 1.3 x10 3/mm — ABNORMAL LOW (ref 1.4–6.5)
Platelet: 154 x10 3/mm (ref 150–440)
RBC: 3.3 10*6/uL — ABNORMAL LOW (ref 4.40–5.90)
RDW: 14.3 % (ref 11.5–14.5)
WBC: 2.4 x10 3/mm — AB (ref 3.8–10.6)

## 2014-06-06 ENCOUNTER — Other Ambulatory Visit: Payer: Self-pay | Admitting: Family Medicine

## 2014-06-19 ENCOUNTER — Ambulatory Visit: Payer: Self-pay | Admitting: Internal Medicine

## 2014-06-20 ENCOUNTER — Ambulatory Visit: Payer: Medicare Other | Admitting: Family Medicine

## 2014-06-20 ENCOUNTER — Encounter: Payer: Self-pay | Admitting: Family Medicine

## 2014-06-20 ENCOUNTER — Ambulatory Visit (INDEPENDENT_AMBULATORY_CARE_PROVIDER_SITE_OTHER): Payer: Medicare Other | Admitting: Family Medicine

## 2014-06-20 VITALS — BP 177/70 | HR 72 | Temp 97.8°F | Wt 226.5 lb

## 2014-06-20 DIAGNOSIS — E119 Type 2 diabetes mellitus without complications: Secondary | ICD-10-CM

## 2014-06-20 DIAGNOSIS — I10 Essential (primary) hypertension: Secondary | ICD-10-CM

## 2014-06-20 LAB — CBC CANCER CENTER
Basophil #: 0 x10 3/mm (ref 0.0–0.1)
Basophil %: 0.4 %
EOS ABS: 0 x10 3/mm (ref 0.0–0.7)
EOS PCT: 1.7 %
HCT: 33.1 % — AB (ref 40.0–52.0)
HGB: 11.1 g/dL — AB (ref 13.0–18.0)
Lymphocyte #: 0.9 x10 3/mm — ABNORMAL LOW (ref 1.0–3.6)
Lymphocyte %: 42.1 %
MCH: 35.9 pg — AB (ref 26.0–34.0)
MCHC: 33.5 g/dL (ref 32.0–36.0)
MCV: 107 fL — ABNORMAL HIGH (ref 80–100)
Monocyte #: 0.1 x10 3/mm — ABNORMAL LOW (ref 0.2–1.0)
Monocyte %: 3.9 %
Neutrophil #: 1.1 x10 3/mm — ABNORMAL LOW (ref 1.4–6.5)
Neutrophil %: 51.9 %
Platelet: 153 x10 3/mm (ref 150–440)
RBC: 3.09 10*6/uL — ABNORMAL LOW (ref 4.40–5.90)
RDW: 14.6 % — ABNORMAL HIGH (ref 11.5–14.5)
WBC: 2.1 x10 3/mm — AB (ref 3.8–10.6)

## 2014-06-20 LAB — URIC ACID: URIC ACID: 3.1 mg/dL — AB (ref 3.5–7.2)

## 2014-06-20 LAB — CREATININE, SERUM: CREATININE: 1.11 mg/dL (ref 0.60–1.30)

## 2014-06-20 MED ORDER — HYDROCHLOROTHIAZIDE 25 MG PO TABS: 25.0000 mg | ORAL_TABLET | Freq: Every day | ORAL | Status: DC

## 2014-06-20 MED ORDER — INSULIN REGULAR HUMAN 100 UNIT/ML IJ SOLN
10.0000 [IU] | Freq: Two times a day (BID) | INTRAMUSCULAR | Status: DC
Start: 2014-06-20 — End: 2015-06-27

## 2014-06-20 MED ORDER — INSULIN NPH (HUMAN) (ISOPHANE) 100 UNIT/ML ~~LOC~~ SUSP: 32.0000 [IU] | Freq: Two times a day (BID) | SUBCUTANEOUS | Status: DC

## 2014-06-20 NOTE — Assessment & Plan Note (Signed)
Requests change in novolin R/N to humulin R/N for insurance purposes. This was done.

## 2014-06-20 NOTE — Assessment & Plan Note (Signed)
Several elevated readings - 2 in office today and one at recent NP home visit. Pt worried nifedipine causing leg swelling but discussed we most importantly need to improve BP Will add hctz 25mg  daily. rec low sodium diet, increase water and fruits/vegetables rtc 1 mo f/u visit. Will work towards discontinuation of nifedipine. Pt agrees with plan.

## 2014-06-20 NOTE — Progress Notes (Signed)
BP 177/70 mmHg  Pulse 72  Temp(Src) 97.8 F (36.6 C) (Oral)  Wt 226 lb 8 oz (102.74 kg)   CC: discuss meds  Subjective:    Patient ID: Alex Smith, male    DOB: 11-15-1934, 79 y.o.   MRN: [id number]  HPI: Alex Smith is a 79 y.o. male presenting on 06/20/2014 for Follow-up   Nurse with house calls through insurance came and made several recommendations. I agree with avoiding aleve and starting aspirin daily.  HTN - Compliant with current antihypertensive regimen of nifedipine and lisinopril 20mg  bid.  Does not check blood pressures at home. At home on rare checks with elevated readings.  No low blood pressure readings or symptoms of dizziness/syncope.  Denies HA, vision changes, CP/tightness, SOB.  + leg swelling noted, attributed to nifedipine.  Wt Readings from Last 3 Encounters:  06/20/14 226 lb 8 oz (102.74 kg)  04/12/14 224 lb (101.606 kg)  02/08/14 224 lb (101.606 kg)   Body mass index is 34.69 kg/(m^2).  BP Readings from Last 3 Encounters:  06/20/14 177/70  04/12/14 140/76  02/08/14 170/70   Relevant past medical, surgical, family and social history reviewed and updated as indicated.  Allergies and medications reviewed and updated. Current Outpatient Prescriptions on File Prior to Visit  Medication Sig  . acyclovir (ZOVIRAX) 400 MG tablet Take 1 tablet by mouth 2 (two) times daily. For leukemia  . lisinopril (PRINIVIL,ZESTRIL) 20 MG tablet TAKE ONE TABLET BY MOUTH TWO TIMES DAILY  . metFORMIN (GLUCOPHAGE) 1000 MG tablet TAKE 1 TABLET TWICE A DAY AS DIRECTED  . NIFEdipine (PROCARDIA-XL/ADALAT-CC/NIFEDICAL-XL) 30 MG 24 hr tablet Take 1 tablet (30 mg total) by mouth daily.  Marland Kitchen NOVOLIN N 100 UNIT/ML injection INJECT 32 UNITS SUBCUTANEOUSLY TWICE DAILY  . NOVOLIN R RELION 100 UNIT/ML injection Inject 10 Units as directed 2 (two) times daily before a meal.   . omeprazole (PRILOSEC) 20 MG capsule Take 20 mg by mouth daily.  . pravastatin (PRAVACHOL) 20 MG tablet Take 1  tablet (20 mg total) by mouth at bedtime.  . tamsulosin (FLOMAX) 0.4 MG CAPS capsule Take 0.4 mg by mouth daily.  . vitamin B-12 (CYANOCOBALAMIN) 1000 MCG tablet Take 1,000 mcg by mouth daily.  . vitamin E 400 UNIT capsule Take 400 Units by mouth daily.   No current facility-administered medications on file prior to visit.   Past Medical History  Diagnosis Date  . Hypertension   . GERD (gastroesophageal reflux disease)   . HLD (hyperlipidemia)   . History of pneumonia 2000's    "once" (07/07/2012)  . Type II diabetes mellitus   . Hairy cell leukemia 2006    followed by Dr. Cynda Acres  . Basal cell carcinoma of face   . Diverticulosis   . Colon polyps   . BPH (benign prostatic hypertrophy)     followed by urology, discharged (Dr. Bernardo Heater)  . B12 deficiency      Review of Systems Per HPI unless specifically indicated above    Objective:    BP 177/70 mmHg  Pulse 72  Temp(Src) 97.8 F (36.6 C) (Oral)  Wt 226 lb 8 oz (102.74 kg)  Physical Exam  Constitutional: He appears well-developed and well-nourished. No distress.  Obese Body mass index is 34.69 kg/(m^2).   HENT:  Mouth/Throat: Oropharynx is clear and moist. No oropharyngeal exudate.  Cardiovascular: Normal rate, regular rhythm, normal heart sounds and intact distal pulses.   No murmur heard. Pulmonary/Chest: Effort normal and breath sounds normal.  No respiratory distress. He has no wheezes. He has no rales.  Musculoskeletal: He exhibits edema (1+ pitting bilat).  Nursing note and vitals reviewed.  Results for orders placed or performed in visit on 04/12/14  HM DIABETES EYE EXAM  Result Value Ref Range   HM Diabetic Eye Exam No Retinopathy No Retinopathy      Assessment & Plan:   Problem List Items Addressed This Visit    Type II diabetes mellitus    Requests change in novolin R/N to humulin R/N for insurance purposes. This was done.    Relevant Medications      aspirin EC 81 MG tablet   Hypertension - Primary      Several elevated readings - 2 in office today and one at recent NP home visit. Pt worried nifedipine causing leg swelling but discussed we most importantly need to improve BP Will add hctz 25mg  daily. rec low sodium diet, increase water and fruits/vegetables rtc 1 mo f/u visit. Will work towards discontinuation of nifedipine. Pt agrees with plan.    Relevant Medications      aspirin EC 81 MG tablet      hydrochlorothiazide tablet   Other Relevant Orders      Renal function panel       Follow up plan: Return in about 1 month (around 07/20/2014), or as needed, for follow up visit.

## 2014-06-20 NOTE — Progress Notes (Signed)
Pre visit review using our clinic review tool, if applicable. No additional management support is needed unless otherwise documented below in the visit note. 

## 2014-06-20 NOTE — Patient Instructions (Addendum)
We will change from novolin to humalin syringes - hopefully cheaper with your formulary. Continue nifedipine and lisinopril. Start hydrochlorothiazide 25mg  once daily (water pill). Monitor blood pressures with this change.  Return in 1 week for labwork to recheck kidneys and potassium with water pill on board. Try to back off aleve use, ok to use tylenol 500-1000mg  up to twice a day as needed. Ok to start baby aspirin 81mg  enteric coated once a day. Return in 1 month for f/u blood pressure.

## 2014-06-22 LAB — PROT IMMUNOELECTROPHORES(ARMC)

## 2014-06-27 ENCOUNTER — Other Ambulatory Visit (INDEPENDENT_AMBULATORY_CARE_PROVIDER_SITE_OTHER): Payer: Medicare Other

## 2014-06-27 DIAGNOSIS — I10 Essential (primary) hypertension: Secondary | ICD-10-CM

## 2014-06-27 LAB — RENAL FUNCTION PANEL
Albumin: 3.5 g/dL (ref 3.5–5.2)
BUN: 24 mg/dL — ABNORMAL HIGH (ref 6–23)
CO2: 31 mEq/L (ref 19–32)
Calcium: 9.3 mg/dL (ref 8.4–10.5)
Chloride: 102 mEq/L (ref 96–112)
Creatinine, Ser: 1.2 mg/dL (ref 0.4–1.5)
GFR: 65.43 mL/min (ref >60.00)
Glucose, Bld: 185 mg/dL — ABNORMAL HIGH (ref 70–99)
Phosphorus: 3.6 mg/dL (ref 2.3–4.6)
Potassium: 4.4 mEq/L (ref 3.5–5.1)
Sodium: 139 mEq/L (ref 135–145)

## 2014-07-01 ENCOUNTER — Encounter: Payer: Self-pay | Admitting: *Deleted

## 2014-07-04 LAB — CBC CANCER CENTER
Basophil #: 0 x10 3/mm (ref 0.0–0.1)
Basophil %: 0.4 %
Eosinophil #: 0.1 x10 3/mm (ref 0.0–0.7)
Eosinophil %: 2.7 %
HCT: 34.8 % — AB (ref 40.0–52.0)
HGB: 11.8 g/dL — AB (ref 13.0–18.0)
LYMPHS ABS: 1.1 x10 3/mm (ref 1.0–3.6)
Lymphocyte %: 42.8 %
MCH: 36.4 pg — ABNORMAL HIGH (ref 26.0–34.0)
MCHC: 34 g/dL (ref 32.0–36.0)
MCV: 107 fL — ABNORMAL HIGH (ref 80–100)
MONO ABS: 0.1 x10 3/mm — AB (ref 0.2–1.0)
Monocyte %: 2.9 %
NEUTROS ABS: 1.3 x10 3/mm — AB (ref 1.4–6.5)
NEUTROS PCT: 51.2 %
Platelet: 184 x10 3/mm (ref 150–440)
RBC: 3.24 10*6/uL — ABNORMAL LOW (ref 4.40–5.90)
RDW: 14.4 % (ref 11.5–14.5)
WBC: 2.6 x10 3/mm — ABNORMAL LOW (ref 3.8–10.6)

## 2014-07-19 ENCOUNTER — Ambulatory Visit: Payer: Self-pay | Admitting: Internal Medicine

## 2014-07-20 ENCOUNTER — Encounter: Payer: Self-pay | Admitting: Podiatry

## 2014-07-20 ENCOUNTER — Encounter: Payer: Self-pay | Admitting: Family Medicine

## 2014-07-20 ENCOUNTER — Ambulatory Visit (INDEPENDENT_AMBULATORY_CARE_PROVIDER_SITE_OTHER): Payer: Medicare Other | Admitting: Family Medicine

## 2014-07-20 ENCOUNTER — Ambulatory Visit (INDEPENDENT_AMBULATORY_CARE_PROVIDER_SITE_OTHER): Payer: Medicare Other | Admitting: Podiatry

## 2014-07-20 ENCOUNTER — Ambulatory Visit (INDEPENDENT_AMBULATORY_CARE_PROVIDER_SITE_OTHER): Payer: Medicare Other

## 2014-07-20 VITALS — BP 148/70 | HR 68 | Temp 98.1°F | Wt 224.5 lb

## 2014-07-20 VITALS — BP 128/68 | HR 82 | Resp 16 | Ht 67.0 in | Wt 224.0 lb

## 2014-07-20 DIAGNOSIS — I10 Essential (primary) hypertension: Secondary | ICD-10-CM

## 2014-07-20 DIAGNOSIS — B351 Tinea unguium: Secondary | ICD-10-CM

## 2014-07-20 DIAGNOSIS — E119 Type 2 diabetes mellitus without complications: Secondary | ICD-10-CM

## 2014-07-20 DIAGNOSIS — M79676 Pain in unspecified toe(s): Secondary | ICD-10-CM

## 2014-07-20 MED ORDER — HYDROCHLOROTHIAZIDE 25 MG PO TABS: 25.0000 mg | ORAL_TABLET | Freq: Two times a day (BID) | ORAL | Status: DC

## 2014-07-20 NOTE — Progress Notes (Signed)
BP 148/70 mmHg  Pulse 68  Temp(Src) 98.1 F (36.7 C) (Oral)  Wt 224 lb 8 oz (101.833 kg)   CC: f/u HTN  Subjective:    Patient ID: Alex Smith, male    DOB: 03-01-1935, 79 y.o.   MRN: [id number]  HPI: Alex Smith is a 79 y.o. male presenting on 07/20/2014 for Follow-up   HTN - last visit we started hctz 25mg  daily in addition to his lisinopril 20mg  bid and nifedipine 30mg  daily due to several elevated readings prior. We also discussed low sodium diet, increase water and fruits/vegetables. Pt returns today for 1 mo f/u visit.  Concerned nifedipine causing leg swelling - this has improved with hctz.  Has not been checking bp at home. Has cuff but may not work well.   Treadmill stress test 2013 - no ischemia, EF 61%.  BP Readings from Last 3 Encounters:  07/20/14 148/70  06/20/14 177/70  04/12/14 140/76   Lab Results  Component Value Date   HGBA1C 7.5* 04/04/2014    Relevant past medical, surgical, family and social history reviewed and updated as indicated. Interim medical history since our last visit reviewed. Allergies and medications reviewed and updated.  Current Outpatient Prescriptions on File Prior to Visit  Medication Sig  . acyclovir (ZOVIRAX) 400 MG tablet Take 1 tablet by mouth 2 (two) times daily. For leukemia  . aspirin EC 81 MG tablet Take 81 mg by mouth daily.  . insulin regular (HUMULIN R) 100 units/mL injection Inject 0.1 mLs (10 Units total) into the skin 2 (two) times daily before a meal.  . lisinopril (PRINIVIL,ZESTRIL) 20 MG tablet TAKE ONE TABLET BY MOUTH TWO TIMES DAILY  . metFORMIN (GLUCOPHAGE) 1000 MG tablet TAKE 1 TABLET TWICE A DAY AS DIRECTED  . omeprazole (PRILOSEC) 20 MG capsule Take 20 mg by mouth daily.  . pravastatin (PRAVACHOL) 20 MG tablet Take 1 tablet (20 mg total) by mouth at bedtime.  . tamsulosin (FLOMAX) 0.4 MG CAPS capsule Take 0.4 mg by mouth daily.  . vitamin B-12 (CYANOCOBALAMIN) 1000 MCG tablet Take 1,000 mcg by mouth  daily.  . vitamin E 400 UNIT capsule Take 400 Units by mouth daily.  . insulin NPH Human (HUMULIN N) 100 UNIT/ML injection Inject 0.32 mLs (32 Units total) into the skin 2 (two) times daily before a meal. (Patient not taking: Reported on 07/20/2014)   No current facility-administered medications on file prior to visit.    Review of Systems Per HPI unless specifically indicated above     Objective:    BP 148/70 mmHg  Pulse 68  Temp(Src) 98.1 F (36.7 C) (Oral)  Wt 224 lb 8 oz (101.833 kg)  Wt Readings from Last 3 Encounters:  07/20/14 224 lb 8 oz (101.833 kg)  06/20/14 226 lb 8 oz (102.74 kg)  04/12/14 224 lb (101.606 kg)    Physical Exam  Constitutional: He appears well-developed and well-nourished. No distress.  HENT:  Mouth/Throat: Oropharynx is clear and moist. No oropharyngeal exudate.  Cardiovascular: Normal rate, regular rhythm, normal heart sounds and intact distal pulses.   No murmur heard. Pulmonary/Chest: Effort normal and breath sounds normal. No respiratory distress. He has no wheezes. He has no rales.  Musculoskeletal: He exhibits edema (tr pedal edema).  Skin: Skin is warm and dry. No rash noted.  Nursing note and vitals reviewed.      Assessment & Plan:   Problem List Items Addressed This Visit    Essential hypertension - Primary  Improved #s with addition of hctz daily. Will stop nifedipine, increase hctz to 25mg  bid. rtc 10d for Cr check Advised start monitoring bp at home or at local pharmacy and call me with #s in 2 wks, update sooner if persistently elevated  rtc at previously scheduled appt in Feb. Pt agrees with plan. Reviewed importance of good hydration status with this regimen.    Relevant Medications      hydrochlorothiazide tablet   Other Relevant Orders      Renal function panel       Follow up plan: Return if symptoms worsen or fail to improve.

## 2014-07-20 NOTE — Progress Notes (Signed)
Pre visit review using our clinic review tool, if applicable. No additional management support is needed unless otherwise documented below in the visit note. 

## 2014-07-20 NOTE — Patient Instructions (Addendum)
Let's stop nifedipine and see if leg swelling improves. Start water pill (hctz 12.5mg ) twice daily Continue lisinopril 20mg  twice daily. Return in 10 days for lab visit to check kidneys Make sure to stay well hydrated. Either buy new blood pressure cuff or check 1-2 times weekly at local pharmacy and update me with blood pressure readings in 2 weeks. Keep appointment in February, let me know sooner if blood pressure too high.

## 2014-07-20 NOTE — Progress Notes (Signed)
   Subjective:    Patient ID: Alex Smith, male    DOB: 09-03-1934, 79 y.o.   MRN: [id number]  HPI Comments: i need my toenails clipped. They do not hurt. If they grow for a while they will hurt. They have been like this for a long time. i trim my toenails     Review of Systems  Eyes: Positive for itching.  Cardiovascular: Positive for leg swelling.  All other systems reviewed and are negative.      Objective:   Physical Exam: I have reviewed his past medical history medications allergies surgery social history and review of systems. Pulses are palpable bilateral. Neurologic sensorium is intact per Semmes-Weinstein monofilament. Deep tendon reflexes are intact bilateral and muscle strength is 5 over 5 dorsiflexion plantar flexors and inverters and everters all intrinsic musculature is intact. Orthopedic evaluation demonstrates all joints distal to the ankle level fall range of motion without crepitation. Mild flexible hammertoe deformities are noted bilateral. Cutaneous evaluation demonstrates supple well-hydrated cutis with exception of thick yellow dystrophic onychomycotic painful nails bilateral.        Assessment & Plan:  Assessment: Diabetes mellitus with pain in limb secondary to onychomycosis 1 through 5 bilateral.  Plan: Debridement of nails 1 through 5 bilateral service secondary to pain.

## 2014-07-20 NOTE — Assessment & Plan Note (Addendum)
Improved #s with addition of hctz daily. Will stop nifedipine, increase hctz to 25mg  bid. rtc 10d for Cr check Advised start monitoring bp at home or at local pharmacy and call me with #s in 2 wks, update sooner if persistently elevated  rtc at previously scheduled appt in Feb. Pt agrees with plan. Reviewed importance of good hydration status with this regimen.

## 2014-07-25 LAB — CBC CANCER CENTER
Basophil #: 0 x10 3/mm (ref 0.0–0.1)
Basophil %: 0.6 %
EOS PCT: 2.5 %
Eosinophil #: 0.1 x10 3/mm (ref 0.0–0.7)
HCT: 36.2 % — AB (ref 40.0–52.0)
HGB: 12.4 g/dL — AB (ref 13.0–18.0)
LYMPHS ABS: 1.3 x10 3/mm (ref 1.0–3.6)
Lymphocyte %: 44.5 %
MCH: 35.9 pg — AB (ref 26.0–34.0)
MCHC: 34.4 g/dL (ref 32.0–36.0)
MCV: 104 fL — ABNORMAL HIGH (ref 80–100)
MONOS PCT: 2.5 %
Monocyte #: 0.1 x10 3/mm — ABNORMAL LOW (ref 0.2–1.0)
NEUTROS ABS: 1.5 x10 3/mm (ref 1.4–6.5)
Neutrophil %: 49.9 %
Platelet: 182 x10 3/mm (ref 150–440)
RBC: 3.46 10*6/uL — ABNORMAL LOW (ref 4.40–5.90)
RDW: 15.1 % — ABNORMAL HIGH (ref 11.5–14.5)
WBC: 3 x10 3/mm — AB (ref 3.8–10.6)

## 2014-07-25 LAB — LACTATE DEHYDROGENASE: LDH: 180 U/L (ref 85–241)

## 2014-07-25 LAB — CREATININE, SERUM
CREATININE: 1.37 mg/dL — AB (ref 0.60–1.30)
EGFR (African American): >60
EGFR (Non-African Amer.): 54 — ABNORMAL LOW

## 2014-07-25 LAB — URIC ACID: Uric Acid: 5.2 mg/dL (ref 3.5–7.2)

## 2014-07-25 LAB — POTASSIUM: Potassium: 4 mmol/L (ref 3.5–5.1)

## 2014-07-25 LAB — MAGNESIUM: MAGNESIUM: 2.2 mg/dL

## 2014-07-26 ENCOUNTER — Other Ambulatory Visit: Payer: Self-pay | Admitting: Family Medicine

## 2014-07-26 LAB — PROT IMMUNOELECTROPHORES(ARMC)

## 2014-07-29 ENCOUNTER — Other Ambulatory Visit (INDEPENDENT_AMBULATORY_CARE_PROVIDER_SITE_OTHER): Payer: Medicare Other

## 2014-07-29 DIAGNOSIS — I10 Essential (primary) hypertension: Secondary | ICD-10-CM

## 2014-07-29 LAB — RENAL FUNCTION PANEL
Albumin: 3.8 g/dL (ref 3.5–5.2)
BUN: 26 mg/dL — ABNORMAL HIGH (ref 6–23)
CO2: 27 meq/L (ref 19–32)
CREATININE: 1.2 mg/dL (ref 0.4–1.5)
Calcium: 9.3 mg/dL (ref 8.4–10.5)
Chloride: 102 mEq/L (ref 96–112)
GFR: 61.1 mL/min (ref >60.00)
GLUCOSE: 191 mg/dL — AB (ref 70–99)
Phosphorus: 3.7 mg/dL (ref 2.3–4.6)
Potassium: 4.5 mEq/L (ref 3.5–5.1)
SODIUM: 135 meq/L (ref 135–145)

## 2014-08-19 ENCOUNTER — Ambulatory Visit: Payer: Self-pay | Admitting: Internal Medicine

## 2014-08-19 HISTORY — PX: BONE MARROW BIOPSY: SHX199

## 2014-09-19 ENCOUNTER — Ambulatory Visit: Payer: Self-pay | Admitting: Internal Medicine

## 2014-09-19 DIAGNOSIS — C9140 Hairy cell leukemia not having achieved remission: Secondary | ICD-10-CM | POA: Diagnosis not present

## 2014-09-19 LAB — CBC CANCER CENTER
Basophil #: 0 x10 3/mm (ref 0.0–0.1)
Basophil %: 0.5 %
EOS ABS: 0.1 x10 3/mm (ref 0.0–0.7)
EOS PCT: 2.8 %
HCT: 32 % — ABNORMAL LOW (ref 40.0–52.0)
HGB: 11.2 g/dL — ABNORMAL LOW (ref 13.0–18.0)
LYMPHS ABS: 1 x10 3/mm (ref 1.0–3.6)
Lymphocyte %: 44.5 %
MCH: 37.4 pg — AB (ref 26.0–34.0)
MCHC: 34.9 g/dL (ref 32.0–36.0)
MCV: 107 fL — AB (ref 80–100)
MONO ABS: 0.1 x10 3/mm — AB (ref 0.2–1.0)
Monocyte %: 2.6 %
NEUTROS PCT: 49.6 %
Neutrophil #: 1.1 x10 3/mm — ABNORMAL LOW (ref 1.4–6.5)
Platelet: 162 x10 3/mm (ref 150–440)
RBC: 2.99 10*6/uL — AB (ref 4.40–5.90)
RDW: 15.2 % — ABNORMAL HIGH (ref 11.5–14.5)
WBC: 2.2 x10 3/mm — ABNORMAL LOW (ref 3.8–10.6)

## 2014-09-19 LAB — URIC ACID: Uric Acid: 4 mg/dL (ref 3.5–7.2)

## 2014-09-19 LAB — CREATININE, SERUM
CREATININE: 1.19 mg/dL (ref 0.60–1.30)
EGFR (Non-African Amer.): >60

## 2014-09-19 LAB — LACTATE DEHYDROGENASE: LDH: 199 U/L (ref 85–241)

## 2014-09-19 LAB — POTASSIUM: Potassium: 4.5 mmol/L (ref 3.5–5.1)

## 2014-09-20 LAB — PROT IMMUNOELECTROPHORES(ARMC)

## 2014-09-26 ENCOUNTER — Other Ambulatory Visit: Payer: Self-pay | Admitting: Family Medicine

## 2014-10-05 ENCOUNTER — Other Ambulatory Visit (INDEPENDENT_AMBULATORY_CARE_PROVIDER_SITE_OTHER): Payer: Medicare Other

## 2014-10-05 ENCOUNTER — Other Ambulatory Visit: Payer: Self-pay | Admitting: Family Medicine

## 2014-10-05 DIAGNOSIS — E119 Type 2 diabetes mellitus without complications: Secondary | ICD-10-CM

## 2014-10-05 DIAGNOSIS — E785 Hyperlipidemia, unspecified: Secondary | ICD-10-CM | POA: Diagnosis not present

## 2014-10-05 DIAGNOSIS — I10 Essential (primary) hypertension: Secondary | ICD-10-CM

## 2014-10-05 LAB — BASIC METABOLIC PANEL
BUN: 30 mg/dL — ABNORMAL HIGH (ref 6–23)
CALCIUM: 9.6 mg/dL (ref 8.4–10.5)
CO2: 28 mEq/L (ref 19–32)
Chloride: 104 mEq/L (ref 96–112)
Creatinine, Ser: 1.27 mg/dL (ref 0.40–1.50)
GFR: 58.3 mL/min — ABNORMAL LOW (ref >60.00)
Glucose, Bld: 166 mg/dL — ABNORMAL HIGH (ref 70–99)
Potassium: 4.3 mEq/L (ref 3.5–5.1)
SODIUM: 138 meq/L (ref 135–145)

## 2014-10-05 LAB — MICROALBUMIN / CREATININE URINE RATIO
Creatinine,U: 129.1 mg/dL
MICROALB UR: 1 mg/dL (ref 0.0–1.9)
Microalb Creat Ratio: 0.8 mg/g (ref 0.0–30.0)

## 2014-10-05 LAB — LIPID PANEL
CHOL/HDL RATIO: 4
Cholesterol: 147 mg/dL (ref 0–200)
HDL: 36.8 mg/dL — AB (ref >39.00)
NONHDL: 110.2
Triglycerides: 245 mg/dL — ABNORMAL HIGH (ref 0.0–149.0)
VLDL: 49 mg/dL — ABNORMAL HIGH (ref 0.0–40.0)

## 2014-10-05 LAB — HEMOGLOBIN A1C: HEMOGLOBIN A1C: 7.5 % — AB (ref 4.6–6.5)

## 2014-10-05 LAB — LDL CHOLESTEROL, DIRECT: LDL DIRECT: 72 mg/dL

## 2014-10-10 ENCOUNTER — Other Ambulatory Visit: Payer: Self-pay | Admitting: Family Medicine

## 2014-10-10 DIAGNOSIS — Z87898 Personal history of other specified conditions: Secondary | ICD-10-CM | POA: Insufficient documentation

## 2014-10-10 DIAGNOSIS — Z85828 Personal history of other malignant neoplasm of skin: Secondary | ICD-10-CM | POA: Insufficient documentation

## 2014-10-10 DIAGNOSIS — C4491 Basal cell carcinoma of skin, unspecified: Secondary | ICD-10-CM | POA: Diagnosis not present

## 2014-10-12 ENCOUNTER — Encounter: Payer: Self-pay | Admitting: Family Medicine

## 2014-10-12 ENCOUNTER — Ambulatory Visit (INDEPENDENT_AMBULATORY_CARE_PROVIDER_SITE_OTHER): Payer: Medicare Other | Admitting: Family Medicine

## 2014-10-12 VITALS — BP 134/64 | HR 88 | Temp 98.4°F | Wt 223.5 lb

## 2014-10-12 DIAGNOSIS — E119 Type 2 diabetes mellitus without complications: Secondary | ICD-10-CM | POA: Diagnosis not present

## 2014-10-12 DIAGNOSIS — E669 Obesity, unspecified: Secondary | ICD-10-CM | POA: Insufficient documentation

## 2014-10-12 DIAGNOSIS — I10 Essential (primary) hypertension: Secondary | ICD-10-CM | POA: Diagnosis not present

## 2014-10-12 DIAGNOSIS — E785 Hyperlipidemia, unspecified: Secondary | ICD-10-CM

## 2014-10-12 MED ORDER — FLUTICASONE PROPIONATE 50 MCG/ACT NA SUSP: 2.0000 | Freq: Every day | NASAL | Status: DC

## 2014-10-12 NOTE — Assessment & Plan Note (Signed)
Chronic, stable. Continue current metformin, humulin R, and discussed slow taper of humulin N (NPH) to 35u bid. Reviewed labs today. Foot exam today.

## 2014-10-12 NOTE — Assessment & Plan Note (Signed)
Body mass index is 35 kg/(m^2).  Reviewed healthy lifestyle and diet changes to affect sustainable weight loss.

## 2014-10-12 NOTE — Patient Instructions (Addendum)
Start checking more frequently - fasting in the morning and 2 hours after a meal. Keep log.  Slowly increase NPH by 1 unit twice daily to goal 35 units twice daily - if 3 day average sugar >150. Continue meds otherwise as up to now. Good to see you today, call us with questions. Could try nasal steroid for sinus congestion.

## 2014-10-12 NOTE — Assessment & Plan Note (Signed)
Chronic, LDL at goal. Continue pravastatin.

## 2014-10-12 NOTE — Progress Notes (Signed)
Pre visit review using our clinic review tool, if applicable. No additional management support is needed unless otherwise documented below in the visit note. 

## 2014-10-12 NOTE — Progress Notes (Signed)
BP 134/64 mmHg  Pulse 88  Temp(Src) 98.4 F (36.9 C) (Oral)  Wt 223 lb 8 oz (101.379 kg)   CC: f/u DM  Subjective:    Patient ID: Alex Smith, male    DOB: Oct 25, 1934, 80 y.o.   MRN: [id number]  HPI: Alex Smith is a 79 y.o. male presenting on 10/12/2014 for Follow-up   Hairy Cell Leukemia - followed by Dr. Cynda Acres at [facility] onc. In remission since 2006. On chemo for 5 years. Pt endorses leukopenia on recent labwork at [facility] cancer center, due for f/u in next 1-2 wks.  HTN - bp better controlled with recent changes. Compliant with current antihypertensive regimen of hctz 25mg  daily and lisinopril 20mg  bid.  Does check blood pressures at home: well controlled.  No low blood pressure readings or symptoms of dizziness/syncope.  Denies HA, vision changes, CP/tightness, SOB, leg swelling. Leg swelling better off nifedipine. Pt has been taking hctz only once daily.  DM - regularly does check sugars fasting >200 occasionally. This morning 162.  Doesn't check post prandially. Compliant with antihyperglycemic regimen which includes: metformin 1000mg  bid, NPH 32u bid, and humulin R 10u bid prior to meal.  On insulin for last 17 years. Worried because insulin getting expensive. Denies low sugars or hypoglycemic symptoms.  Denies paresthesias. Last diabetic eye exam 02/2014.  Pneumovax: 2006.  Prevnar: 2015. Discussed avoiding added sugars/simple carbs. Lab Results  Component Value Date   HGBA1C 7.5* 10/05/2014   Diabetic Foot Exam - Simple   Simple Foot Form  Diabetic Foot exam was performed with the following findings:  Yes 10/12/2014 11:44 AM  Visual Inspection  No deformities, no ulcerations, no other skin breakdown bilaterally:  Yes  Sensation Testing  Intact to touch and monofilament testing bilaterally:  Yes  Pulse Check  Posterior Tibialis and Dorsalis pulse intact bilaterally:  Yes  Comments  Elongated nails, some onychomycosis      HLD - compliant with pravastatin daily without  myalgias.  Ongoing sinus congestion over last several months treated with claritin.   Obesity - no exercise routine. No treadmill at home. Works at [employer] with significant walking. Body mass index is 35 kg/(m^2).  Relevant past medical, surgical, family and social history reviewed and updated as indicated. Interim medical history since our last visit reviewed. Allergies and medications reviewed and updated. Current Outpatient Prescriptions on File Prior to Visit  Medication Sig  . acyclovir (ZOVIRAX) 400 MG tablet Take 1 tablet by mouth 2 (two) times daily. For leukemia  . aspirin EC 81 MG tablet Take 81 mg by mouth daily.  Marland Kitchen glucose blood (ACCU-CHEK AVIVA PLUS) test strip Use to check sugar twice daily Dx:E11.9  . insulin regular (HUMULIN R) 100 units/mL injection Inject 0.1 mLs (10 Units total) into the skin 2 (two) times daily before a meal.  . lisinopril (PRINIVIL,ZESTRIL) 20 MG tablet TAKE ONE TABLET BY MOUTH TWICE DAILY  . metFORMIN (GLUCOPHAGE) 1000 MG tablet TAKE ONE TABLET BY MOUTH TWICE DAILY AS DIRECTED  . omeprazole (PRILOSEC) 20 MG capsule Take 20 mg by mouth daily.  . pravastatin (PRAVACHOL) 20 MG tablet TAKE ONE TABLET AT BEDTIME  . tamsulosin (FLOMAX) 0.4 MG CAPS capsule Take 0.4 mg by mouth daily.  . vitamin B-12 (CYANOCOBALAMIN) 1000 MCG tablet Take 1,000 mcg by mouth daily.  . vitamin E 400 UNIT capsule Take 400 Units by mouth daily.   No current facility-administered medications on file prior to visit.    Review of Systems  Per HPI unless specifically indicated above     Objective:    BP 134/64 mmHg  Pulse 88  Temp(Src) 98.4 F (36.9 C) (Oral)  Wt 223 lb 8 oz (101.379 kg)  Wt Readings from Last 3 Encounters:  10/12/14 223 lb 8 oz (101.379 kg)  07/20/14 224 lb (101.606 kg)  07/20/14 224 lb 8 oz (101.833 kg)   Body mass index is 35 kg/(m^2).  Physical Exam  Constitutional: He appears well-developed and well-nourished. No distress.  HENT:  Head:  Normocephalic and atraumatic.  Right Ear: Hearing, tympanic membrane, external ear and ear canal normal.  Left Ear: Hearing, tympanic membrane, external ear and ear canal normal.  Nose: Mucosal edema (mild congestion) present. No rhinorrhea. Right sinus exhibits no maxillary sinus tenderness and no frontal sinus tenderness. Left sinus exhibits no maxillary sinus tenderness and no frontal sinus tenderness.  Mouth/Throat: Uvula is midline, oropharynx is clear and moist and mucous membranes are normal. No oropharyngeal exudate, posterior oropharyngeal edema, posterior oropharyngeal erythema or tonsillar abscesses.  Eyes: Conjunctivae and EOM are normal. Pupils are equal, round, and reactive to light. No scleral icterus.  Neck: Normal range of motion. Neck supple.  Cardiovascular: Normal rate, regular rhythm, normal heart sounds and intact distal pulses.   No murmur heard. Pulmonary/Chest: Effort normal and breath sounds normal. No respiratory distress. He has no wheezes. He has no rales.  Lymphadenopathy:    He has no cervical adenopathy.  Skin: Skin is warm and dry. No rash noted.  Nursing note and vitals reviewed.  Results for orders placed or performed in visit on 10/05/14  Lipid panel  Result Value Ref Range   Cholesterol 147 0 - 200 mg/dL   Triglycerides 245.0 (H) 0.0 - 149.0 mg/dL   HDL 36.80 (L) >39.00 mg/dL   VLDL 49.0 (H) 0.0 - 40.0 mg/dL   Total CHOL/HDL Ratio 4    NonHDL 110.20   Hemoglobin A1c  Result Value Ref Range   Hgb A1c MFr Bld 7.5 (H) 4.6 - 6.5 %  Basic metabolic panel  Result Value Ref Range   Sodium 138 135 - 145 mEq/L   Potassium 4.3 3.5 - 5.1 mEq/L   Chloride 104 96 - 112 mEq/L   CO2 28 19 - 32 mEq/L   Glucose, Bld 166 (H) 70 - 99 mg/dL   BUN 30 (H) 6 - 23 mg/dL   Creatinine, Ser 1.27 0.40 - 1.50 mg/dL   Calcium 9.6 8.4 - 10.5 mg/dL   GFR 58.30 (L) >60.00 mL/min  Microalbumin / creatinine urine ratio  Result Value Ref Range   Microalb, Ur 1.0 0.0 - 1.9  mg/dL   Creatinine,U 129.1 mg/dL   Microalb Creat Ratio 0.8 0.0 - 30.0 mg/g  LDL cholesterol, direct  Result Value Ref Range   Direct LDL 72.0 mg/dL      Assessment & Plan:   Problem List Items Addressed This Visit    Type II diabetes mellitus - Primary    Chronic, stable. Continue current metformin, humulin R, and discussed slow taper of humulin N (NPH) to 35u bid. Reviewed labs today. Foot exam today.      Relevant Medications   insulin NPH Human (HUMULIN N) 100 UNIT/ML injection   Obesity    Body mass index is 35 kg/(m^2).  Reviewed healthy lifestyle and diet changes to affect sustainable weight loss.      Relevant Medications   insulin NPH Human (HUMULIN N) 100 UNIT/ML injection   Essential hypertension  Chronic stable. Continue current regimen.      Relevant Medications   hydrochlorothiazide tablet   Dyslipidemia    Chronic, LDL at goal. Continue pravastatin.          Follow up plan: Return in about 4 months (around 02/10/2015), or if symptoms worsen or fail to improve, for follow up visit.

## 2014-10-12 NOTE — Assessment & Plan Note (Signed)
Chronic stable. Continue current regimen. 

## 2014-10-18 ENCOUNTER — Ambulatory Visit
Admit: 2014-10-18 | Disposition: A | Discharge status: Home / Self Care | Payer: Self-pay | Attending: Internal Medicine | Admitting: Internal Medicine

## 2014-10-24 ENCOUNTER — Other Ambulatory Visit: Payer: Medicare Other

## 2014-10-24 ENCOUNTER — Ambulatory Visit (INDEPENDENT_AMBULATORY_CARE_PROVIDER_SITE_OTHER): Payer: Medicare Other | Admitting: Ophthalmology

## 2014-10-24 DIAGNOSIS — H43811 Vitreous degeneration, right eye: Secondary | ICD-10-CM

## 2014-10-24 DIAGNOSIS — I10 Essential (primary) hypertension: Secondary | ICD-10-CM

## 2014-10-24 DIAGNOSIS — E11329 Type 2 diabetes mellitus with mild nonproliferative diabetic retinopathy without macular edema: Secondary | ICD-10-CM

## 2014-10-24 DIAGNOSIS — H35033 Hypertensive retinopathy, bilateral: Secondary | ICD-10-CM

## 2014-10-24 DIAGNOSIS — H35342 Macular cyst, hole, or pseudohole, left eye: Secondary | ICD-10-CM

## 2014-10-24 DIAGNOSIS — E11319 Type 2 diabetes mellitus with unspecified diabetic retinopathy without macular edema: Secondary | ICD-10-CM | POA: Diagnosis not present

## 2014-11-07 DIAGNOSIS — N401 Enlarged prostate with lower urinary tract symptoms: Secondary | ICD-10-CM | POA: Diagnosis not present

## 2014-11-07 DIAGNOSIS — R310 Gross hematuria: Secondary | ICD-10-CM | POA: Diagnosis not present

## 2014-11-14 DIAGNOSIS — D472 Monoclonal gammopathy: Secondary | ICD-10-CM | POA: Diagnosis not present

## 2014-11-14 DIAGNOSIS — Z79899 Other long term (current) drug therapy: Secondary | ICD-10-CM | POA: Diagnosis not present

## 2014-11-14 DIAGNOSIS — C9140 Hairy cell leukemia not having achieved remission: Secondary | ICD-10-CM | POA: Diagnosis not present

## 2014-11-14 DIAGNOSIS — R609 Edema, unspecified: Secondary | ICD-10-CM | POA: Diagnosis not present

## 2014-11-14 DIAGNOSIS — Z7982 Long term (current) use of aspirin: Secondary | ICD-10-CM | POA: Diagnosis not present

## 2014-11-14 DIAGNOSIS — I10 Essential (primary) hypertension: Secondary | ICD-10-CM | POA: Diagnosis not present

## 2014-11-14 DIAGNOSIS — Z86010 Personal history of colonic polyps: Secondary | ICD-10-CM | POA: Diagnosis not present

## 2014-11-14 DIAGNOSIS — Z794 Long term (current) use of insulin: Secondary | ICD-10-CM | POA: Diagnosis not present

## 2014-11-14 DIAGNOSIS — K2270 Barrett's esophagus without dysplasia: Secondary | ICD-10-CM | POA: Diagnosis not present

## 2014-11-14 DIAGNOSIS — E119 Type 2 diabetes mellitus without complications: Secondary | ICD-10-CM | POA: Diagnosis not present

## 2014-11-14 DIAGNOSIS — Z85828 Personal history of other malignant neoplasm of skin: Secondary | ICD-10-CM | POA: Diagnosis not present

## 2014-11-14 LAB — CBC CANCER CENTER
BASOS ABS: 0 x10 3/mm (ref 0.0–0.1)
Basophil %: 0.6 %
Eosinophil #: 0 x10 3/mm (ref 0.0–0.7)
Eosinophil %: 2.2 %
HCT: 29.6 % — AB (ref 40.0–52.0)
HGB: 10.3 g/dL — ABNORMAL LOW (ref 13.0–18.0)
LYMPHS ABS: 0.9 x10 3/mm — AB (ref 1.0–3.6)
Lymphocyte %: 47.9 %
MCH: 37.5 pg — AB (ref 26.0–34.0)
MCHC: 34.9 g/dL (ref 32.0–36.0)
MCV: 107 fL — AB (ref 80–100)
MONO ABS: 0 x10 3/mm — AB (ref 0.2–1.0)
MONOS PCT: 2.5 %
NEUTROS ABS: 0.9 x10 3/mm — AB (ref 1.4–6.5)
Neutrophil %: 46.8 %
PLATELETS: 168 x10 3/mm (ref 150–440)
RBC: 2.76 10*6/uL — ABNORMAL LOW (ref 4.40–5.90)
RDW: 14.5 % (ref 11.5–14.5)
WBC: 1.9 x10 3/mm — AB (ref 3.8–10.6)

## 2014-11-14 LAB — CREATININE, SERUM
Creatinine: 1.27 mg/dL — ABNORMAL HIGH
EGFR (Non-African Amer.): 54 — ABNORMAL LOW

## 2014-11-14 LAB — LACTATE DEHYDROGENASE: LDH: 141 U/L

## 2014-11-14 LAB — URIC ACID: Uric Acid: 5.3 mg/dL

## 2014-11-15 LAB — PROT IMMUNOELECTROPHORES(ARMC)

## 2014-11-15 LAB — KAPPA/LAMBDA FREE LIGHT CHAINS (ARMC)

## 2014-11-18 ENCOUNTER — Ambulatory Visit
Admit: 2014-11-18 | Disposition: A | Discharge status: Home / Self Care | Payer: Self-pay | Attending: Internal Medicine | Admitting: Internal Medicine

## 2014-11-18 DIAGNOSIS — C9590 Leukemia, unspecified not having achieved remission: Secondary | ICD-10-CM | POA: Diagnosis not present

## 2014-11-18 DIAGNOSIS — Z85828 Personal history of other malignant neoplasm of skin: Secondary | ICD-10-CM | POA: Diagnosis not present

## 2014-11-18 DIAGNOSIS — C9141 Hairy cell leukemia, in remission: Secondary | ICD-10-CM | POA: Diagnosis not present

## 2014-11-18 DIAGNOSIS — R609 Edema, unspecified: Secondary | ICD-10-CM | POA: Diagnosis not present

## 2014-11-18 DIAGNOSIS — B029 Zoster without complications: Secondary | ICD-10-CM | POA: Diagnosis not present

## 2014-11-18 DIAGNOSIS — L928 Other granulomatous disorders of the skin and subcutaneous tissue: Secondary | ICD-10-CM | POA: Diagnosis not present

## 2014-11-18 DIAGNOSIS — I10 Essential (primary) hypertension: Secondary | ICD-10-CM | POA: Diagnosis not present

## 2014-11-18 DIAGNOSIS — Z7982 Long term (current) use of aspirin: Secondary | ICD-10-CM | POA: Diagnosis not present

## 2014-11-18 DIAGNOSIS — E119 Type 2 diabetes mellitus without complications: Secondary | ICD-10-CM | POA: Diagnosis not present

## 2014-11-18 DIAGNOSIS — R5383 Other fatigue: Secondary | ICD-10-CM | POA: Diagnosis not present

## 2014-11-18 DIAGNOSIS — Z79899 Other long term (current) drug therapy: Secondary | ICD-10-CM | POA: Diagnosis not present

## 2014-11-18 DIAGNOSIS — D472 Monoclonal gammopathy: Secondary | ICD-10-CM | POA: Diagnosis not present

## 2014-11-18 DIAGNOSIS — Z86010 Personal history of colonic polyps: Secondary | ICD-10-CM | POA: Diagnosis not present

## 2014-11-18 LAB — CREATININE, SERUM
Creatinine: 1.23 mg/dL
EGFR (African American): >60
EGFR (Non-African Amer.): 56 — ABNORMAL LOW

## 2014-11-18 LAB — CBC CANCER CENTER
Basophil #: 0 x10 3/mm (ref 0.0–0.1)
Basophil %: 0.7 %
EOS PCT: 3.2 %
Eosinophil #: 0.1 x10 3/mm (ref 0.0–0.7)
HCT: 31.1 % — ABNORMAL LOW (ref 40.0–52.0)
HGB: 10.9 g/dL — ABNORMAL LOW (ref 13.0–18.0)
Lymphocyte #: 1 x10 3/mm (ref 1.0–3.6)
Lymphocyte %: 46 %
MCH: 37.8 pg — ABNORMAL HIGH (ref 26.0–34.0)
MCHC: 35.1 g/dL (ref 32.0–36.0)
MCV: 108 fL — AB (ref 80–100)
MONO ABS: 0.1 x10 3/mm — AB (ref 0.2–1.0)
MONOS PCT: 2.9 %
NEUTROS PCT: 47.2 %
Neutrophil #: 1 x10 3/mm — ABNORMAL LOW (ref 1.4–6.5)
Platelet: 172 x10 3/mm (ref 150–440)
RBC: 2.89 10*6/uL — ABNORMAL LOW (ref 4.40–5.90)
RDW: 14.5 % (ref 11.5–14.5)
WBC: 2.2 x10 3/mm — AB (ref 3.8–10.6)

## 2014-11-18 LAB — IRON AND TIBC
IRON SATURATION: 17.5
IRON: 70 ug/dL
Iron Bind.Cap.(Total): 399 (ref 250–450)
Unbound Iron-Bind.Cap.: 329.4

## 2014-11-18 LAB — FERRITIN: Ferritin (ARMC): 33 ng/mL

## 2014-11-18 IMAGING — US ABDOMEN ULTRASOUND LIMITED
1 series · 12 of 12 positions shown · non-contrast
Comparison: CT [DATE] .

CLINICAL DATA: Evaluate spleen size.  Leukemia .

EXAM:
US ABDOMEN LIMITED - LEFT UPPER QUADRANT

[Series 1: abdomen ultrasound limited · 0.25mm/px · 12 of 12 slices shown]
[im 1/12]
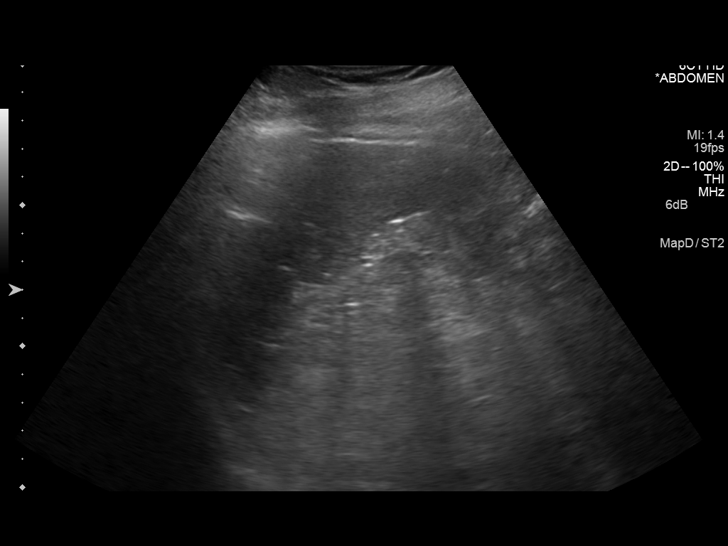
[im 2/12]
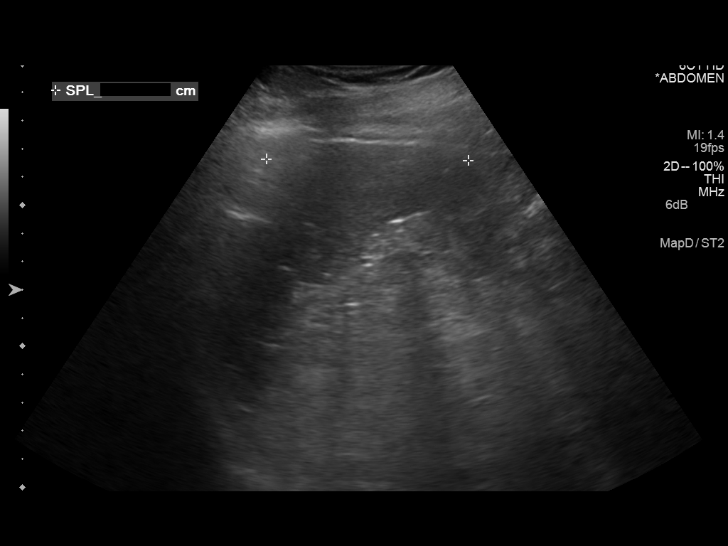
[im 3/12]
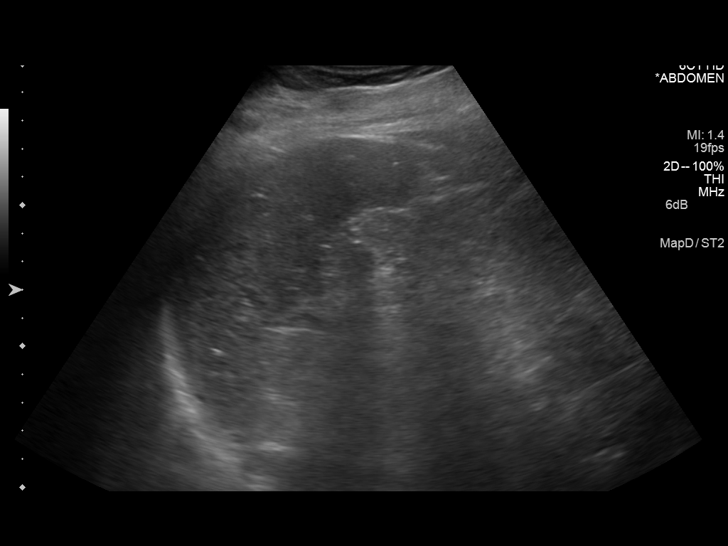
[im 4/12]
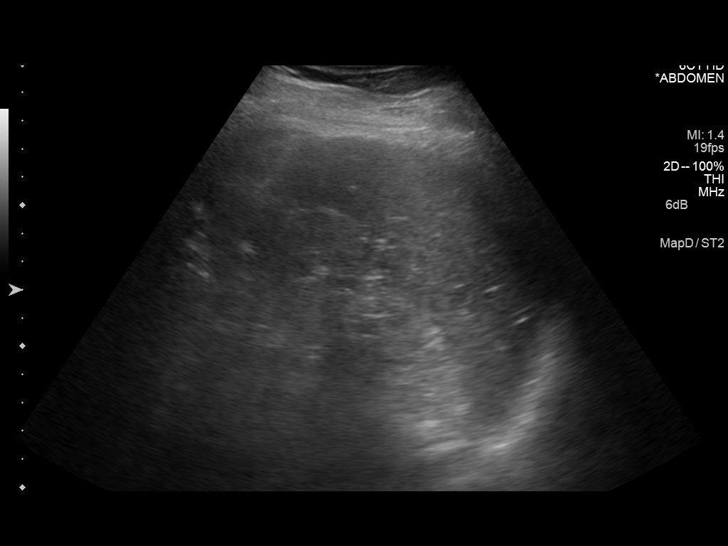
[im 5/12]
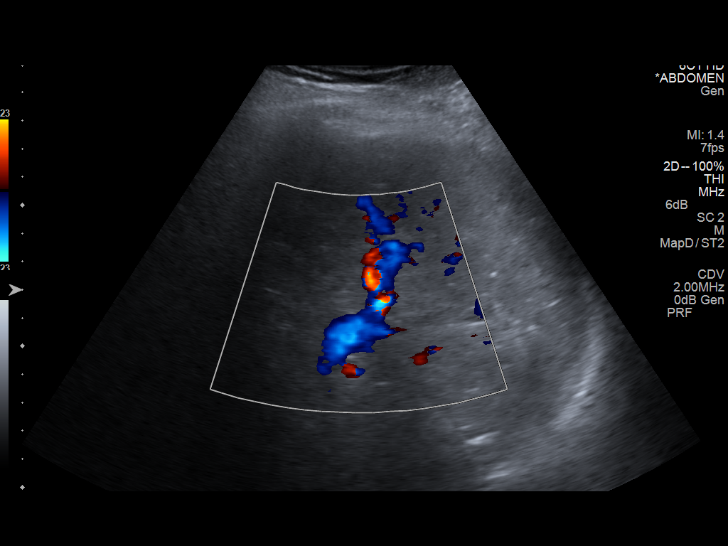
[im 6/12]
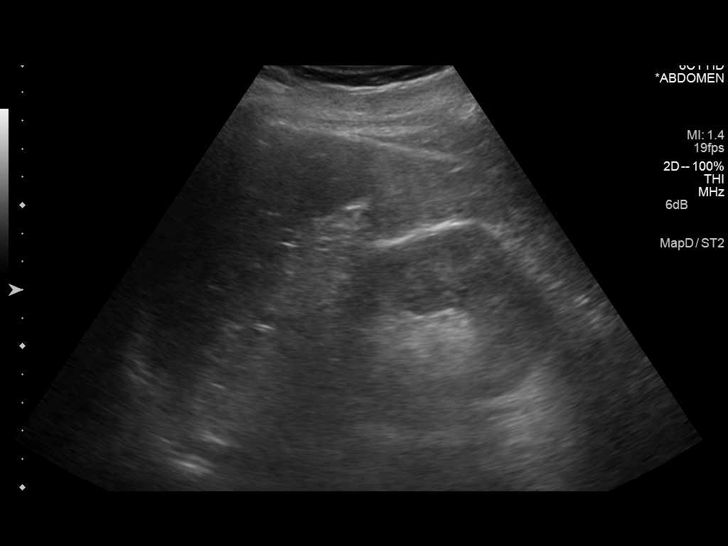
[im 7/12]
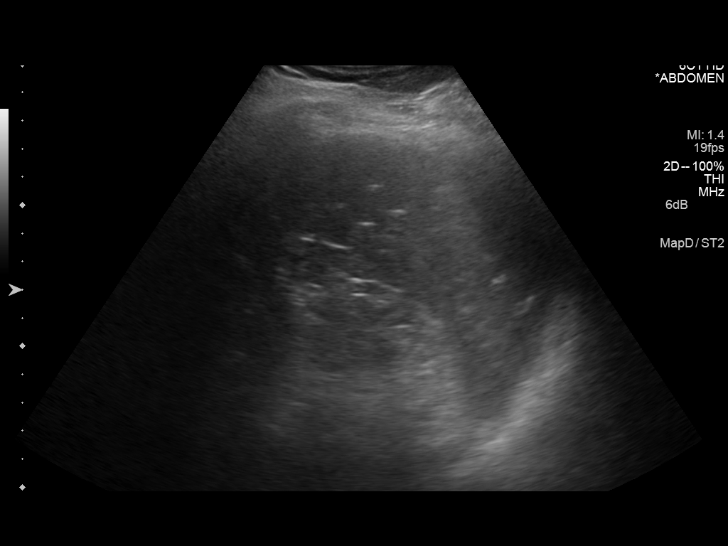
[im 8/12]
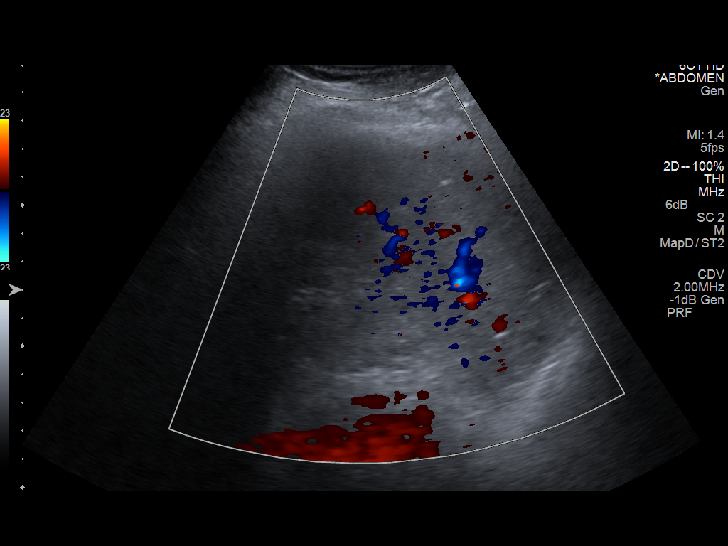
[im 9/12]
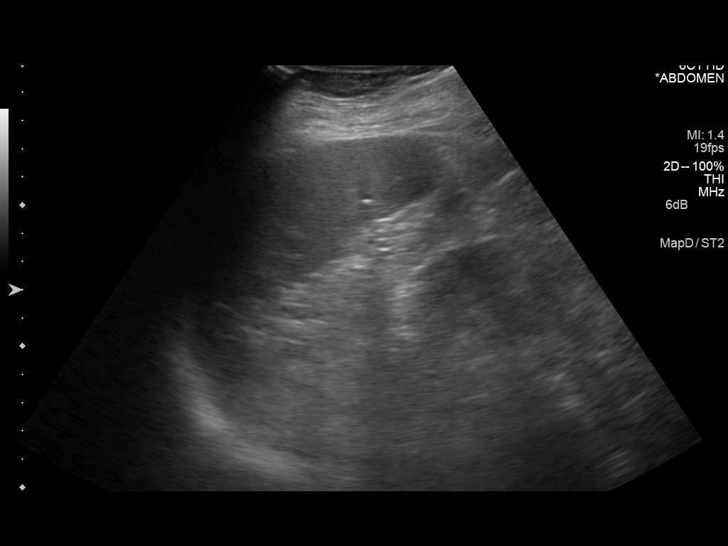
[im 10/12]
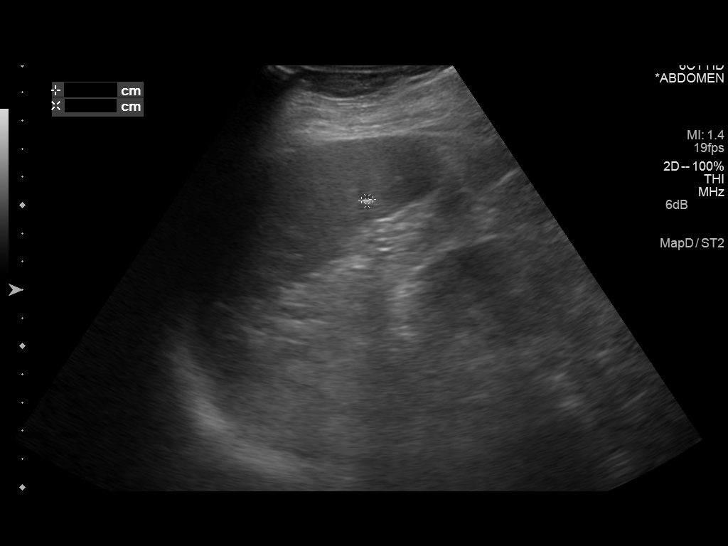
[im 11/12]
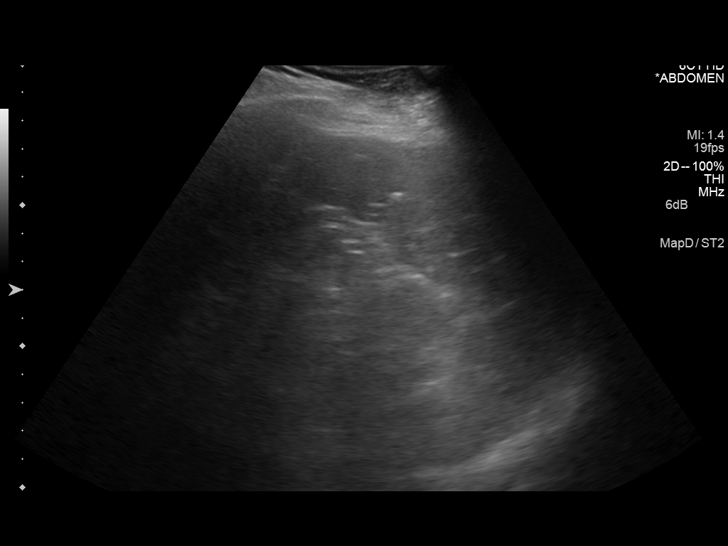
[im 12/12]
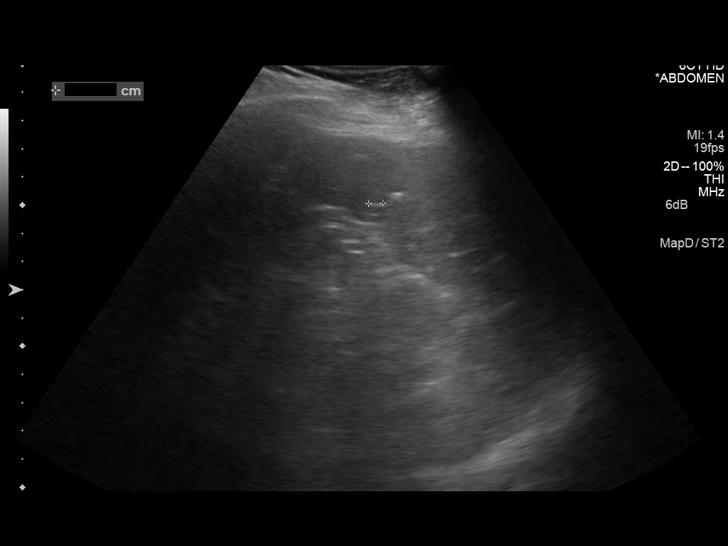

[12 of 12 positions shown; findings below may reference images not displayed]

FINDINGS: Spleen measures 7.2 cm. No evidence of splenomegaly. Calcified
splenic granuloma noted .
IMPRESSION: No evidence of splenomegaly.  Spleen measures 7.2 cm.

## 2014-11-25 DIAGNOSIS — C9141 Hairy cell leukemia, in remission: Secondary | ICD-10-CM | POA: Diagnosis not present

## 2014-11-25 DIAGNOSIS — Z86010 Personal history of colonic polyps: Secondary | ICD-10-CM | POA: Diagnosis not present

## 2014-11-25 DIAGNOSIS — B029 Zoster without complications: Secondary | ICD-10-CM | POA: Diagnosis not present

## 2014-11-25 DIAGNOSIS — Z7982 Long term (current) use of aspirin: Secondary | ICD-10-CM | POA: Diagnosis not present

## 2014-11-25 DIAGNOSIS — E119 Type 2 diabetes mellitus without complications: Secondary | ICD-10-CM | POA: Diagnosis not present

## 2014-11-25 DIAGNOSIS — I10 Essential (primary) hypertension: Secondary | ICD-10-CM | POA: Diagnosis not present

## 2014-11-25 DIAGNOSIS — Z79899 Other long term (current) drug therapy: Secondary | ICD-10-CM | POA: Diagnosis not present

## 2014-11-25 DIAGNOSIS — R5383 Other fatigue: Secondary | ICD-10-CM | POA: Diagnosis not present

## 2014-11-25 DIAGNOSIS — L928 Other granulomatous disorders of the skin and subcutaneous tissue: Secondary | ICD-10-CM | POA: Diagnosis not present

## 2014-11-25 DIAGNOSIS — Z85828 Personal history of other malignant neoplasm of skin: Secondary | ICD-10-CM | POA: Diagnosis not present

## 2014-11-25 DIAGNOSIS — R609 Edema, unspecified: Secondary | ICD-10-CM | POA: Diagnosis not present

## 2014-11-25 DIAGNOSIS — D472 Monoclonal gammopathy: Secondary | ICD-10-CM | POA: Diagnosis not present

## 2014-11-25 LAB — CBC CANCER CENTER
Basophil #: 0 x10 3/mm (ref 0.0–0.1)
Basophil %: 0.8 %
EOS PCT: 3.1 %
Eosinophil #: 0.1 x10 3/mm (ref 0.0–0.7)
HCT: 29.2 % — AB (ref 40.0–52.0)
HGB: 10 g/dL — ABNORMAL LOW (ref 13.0–18.0)
Lymphocyte #: 0.9 x10 3/mm — ABNORMAL LOW (ref 1.0–3.6)
Lymphocyte %: 50.4 %
MCH: 37.1 pg — ABNORMAL HIGH (ref 26.0–34.0)
MCHC: 34.3 g/dL (ref 32.0–36.0)
MCV: 108 fL — AB (ref 80–100)
MONOS PCT: 2.5 %
Monocyte #: 0 x10 3/mm — ABNORMAL LOW (ref 0.2–1.0)
NEUTROS ABS: 0.8 x10 3/mm — AB (ref 1.4–6.5)
Neutrophil %: 43.2 %
PLATELETS: 150 x10 3/mm (ref 150–440)
RBC: 2.7 10*6/uL — AB (ref 4.40–5.90)
RDW: 14.9 % — ABNORMAL HIGH (ref 11.5–14.5)
WBC: 1.8 x10 3/mm — AB (ref 3.8–10.6)

## 2014-12-14 DIAGNOSIS — C9141 Hairy cell leukemia, in remission: Secondary | ICD-10-CM | POA: Diagnosis not present

## 2014-12-14 DIAGNOSIS — R609 Edema, unspecified: Secondary | ICD-10-CM | POA: Diagnosis not present

## 2014-12-14 DIAGNOSIS — L928 Other granulomatous disorders of the skin and subcutaneous tissue: Secondary | ICD-10-CM | POA: Diagnosis not present

## 2014-12-14 DIAGNOSIS — R5383 Other fatigue: Secondary | ICD-10-CM | POA: Diagnosis not present

## 2014-12-14 DIAGNOSIS — B029 Zoster without complications: Secondary | ICD-10-CM | POA: Diagnosis not present

## 2014-12-14 DIAGNOSIS — D472 Monoclonal gammopathy: Secondary | ICD-10-CM | POA: Diagnosis not present

## 2014-12-14 DIAGNOSIS — Z7982 Long term (current) use of aspirin: Secondary | ICD-10-CM | POA: Diagnosis not present

## 2014-12-14 DIAGNOSIS — Z79899 Other long term (current) drug therapy: Secondary | ICD-10-CM | POA: Diagnosis not present

## 2014-12-14 DIAGNOSIS — E119 Type 2 diabetes mellitus without complications: Secondary | ICD-10-CM | POA: Diagnosis not present

## 2014-12-14 DIAGNOSIS — I10 Essential (primary) hypertension: Secondary | ICD-10-CM | POA: Diagnosis not present

## 2014-12-14 DIAGNOSIS — Z85828 Personal history of other malignant neoplasm of skin: Secondary | ICD-10-CM | POA: Diagnosis not present

## 2014-12-14 DIAGNOSIS — Z86010 Personal history of colonic polyps: Secondary | ICD-10-CM | POA: Diagnosis not present

## 2014-12-14 LAB — CBC CANCER CENTER
BASOS ABS: 0 x10 3/mm (ref 0.0–0.1)
Basophil %: 0.5 %
EOS ABS: 0.1 x10 3/mm (ref 0.0–0.7)
Eosinophil %: 3.3 %
HCT: 29.2 % — AB (ref 40.0–52.0)
HGB: 9.9 g/dL — ABNORMAL LOW (ref 13.0–18.0)
LYMPHS ABS: 0.7 x10 3/mm — AB (ref 1.0–3.6)
Lymphocyte %: 32.9 %
MCH: 36.9 pg — ABNORMAL HIGH (ref 26.0–34.0)
MCHC: 34 g/dL (ref 32.0–36.0)
MCV: 109 fL — AB (ref 80–100)
MONO ABS: 0 x10 3/mm — AB (ref 0.2–1.0)
Monocyte %: 2 %
Neutrophil #: 1.4 x10 3/mm (ref 1.4–6.5)
Neutrophil %: 61.3 %
Platelet: 148 x10 3/mm — ABNORMAL LOW (ref 150–440)
RBC: 2.68 10*6/uL — AB (ref 4.40–5.90)
RDW: 14.6 % — AB (ref 11.5–14.5)
WBC: 2.2 x10 3/mm — AB (ref 3.8–10.6)

## 2014-12-14 LAB — CREATININE, SERUM
Creatinine: 1.2 mg/dL
EGFR (African American): >60
EGFR (Non-African Amer.): 58 — ABNORMAL LOW

## 2014-12-28 ENCOUNTER — Other Ambulatory Visit: Payer: Self-pay | Admitting: *Deleted

## 2014-12-28 ENCOUNTER — Inpatient Hospital Stay: Payer: Medicare Other | Attending: Internal Medicine

## 2014-12-28 DIAGNOSIS — E538 Deficiency of other specified B group vitamins: Secondary | ICD-10-CM | POA: Insufficient documentation

## 2014-12-28 DIAGNOSIS — Z794 Long term (current) use of insulin: Secondary | ICD-10-CM | POA: Insufficient documentation

## 2014-12-28 DIAGNOSIS — R5383 Other fatigue: Secondary | ICD-10-CM | POA: Diagnosis not present

## 2014-12-28 DIAGNOSIS — Z7982 Long term (current) use of aspirin: Secondary | ICD-10-CM | POA: Insufficient documentation

## 2014-12-28 DIAGNOSIS — C9140 Hairy cell leukemia not having achieved remission: Secondary | ICD-10-CM

## 2014-12-28 DIAGNOSIS — C9141 Hairy cell leukemia, in remission: Secondary | ICD-10-CM | POA: Insufficient documentation

## 2014-12-28 DIAGNOSIS — Z8701 Personal history of pneumonia (recurrent): Secondary | ICD-10-CM | POA: Diagnosis not present

## 2014-12-28 DIAGNOSIS — K219 Gastro-esophageal reflux disease without esophagitis: Secondary | ICD-10-CM | POA: Diagnosis not present

## 2014-12-28 DIAGNOSIS — E119 Type 2 diabetes mellitus without complications: Secondary | ICD-10-CM | POA: Diagnosis not present

## 2014-12-28 DIAGNOSIS — Z87891 Personal history of nicotine dependence: Secondary | ICD-10-CM | POA: Insufficient documentation

## 2014-12-28 DIAGNOSIS — E785 Hyperlipidemia, unspecified: Secondary | ICD-10-CM | POA: Insufficient documentation

## 2014-12-28 DIAGNOSIS — I10 Essential (primary) hypertension: Secondary | ICD-10-CM | POA: Insufficient documentation

## 2014-12-28 DIAGNOSIS — Z79899 Other long term (current) drug therapy: Secondary | ICD-10-CM | POA: Diagnosis not present

## 2014-12-28 DIAGNOSIS — Z85828 Personal history of other malignant neoplasm of skin: Secondary | ICD-10-CM | POA: Insufficient documentation

## 2014-12-28 DIAGNOSIS — R531 Weakness: Secondary | ICD-10-CM | POA: Insufficient documentation

## 2014-12-28 DIAGNOSIS — N400 Enlarged prostate without lower urinary tract symptoms: Secondary | ICD-10-CM | POA: Insufficient documentation

## 2014-12-28 LAB — CBC WITH DIFFERENTIAL/PLATELET
Basophils Absolute: 0 10*3/uL (ref 0–0.1)
Basophils Relative: 1 %
EOS ABS: 0.1 10*3/uL (ref 0–0.7)
Eosinophils Relative: 3 %
HCT: 30.5 % — ABNORMAL LOW (ref 40.0–52.0)
Hemoglobin: 10.3 g/dL — ABNORMAL LOW (ref 13.0–18.0)
LYMPHS ABS: 0.9 10*3/uL — AB (ref 1.0–3.6)
Lymphocytes Relative: 46 %
MCH: 36.3 pg — AB (ref 26.0–34.0)
MCHC: 33.9 g/dL (ref 32.0–36.0)
MCV: 107.1 fL — AB (ref 80.0–100.0)
MONO ABS: 0 10*3/uL — AB (ref 0.2–1.0)
MONOS PCT: 2 %
NEUTROS PCT: 48 %
Neutro Abs: 0.9 10*3/uL — ABNORMAL LOW (ref 1.4–6.5)
Platelets: 193 10*3/uL (ref 150–440)
RBC: 2.85 MIL/uL — AB (ref 4.40–5.90)
RDW: 14.6 % — ABNORMAL HIGH (ref 11.5–14.5)
WBC: 2 10*3/uL — AB (ref 3.8–10.6)

## 2014-12-28 LAB — URIC ACID: Uric Acid, Serum: 4.9 mg/dL (ref 4.4–7.6)

## 2014-12-28 LAB — HEPATIC FUNCTION PANEL
ALBUMIN: 4.1 g/dL (ref 3.5–5.0)
ALT: 18 U/L (ref 17–63)
AST: 19 U/L (ref 15–41)
Alkaline Phosphatase: 55 U/L (ref 38–126)
BILIRUBIN TOTAL: 0.4 mg/dL (ref 0.3–1.2)
Bilirubin, Direct: <0.1 mg/dL — ABNORMAL LOW (ref 0.1–0.5)
TOTAL PROTEIN: 7.7 g/dL (ref 6.5–8.1)

## 2014-12-28 LAB — CREATININE, SERUM
Creatinine, Ser: 1.07 mg/dL (ref 0.61–1.24)
GFR calc Af Amer: >60 mL/min (ref >60)
GFR calc non Af Amer: >60 mL/min (ref >60)

## 2014-12-28 LAB — LACTATE DEHYDROGENASE: LDH: 149 U/L (ref 98–192)

## 2015-01-10 ENCOUNTER — Other Ambulatory Visit: Payer: Self-pay | Admitting: Family Medicine

## 2015-01-10 DIAGNOSIS — C9140 Hairy cell leukemia not having achieved remission: Secondary | ICD-10-CM

## 2015-01-11 ENCOUNTER — Inpatient Hospital Stay (HOSPITAL_BASED_OUTPATIENT_CLINIC_OR_DEPARTMENT_OTHER): Payer: Medicare Other | Admitting: Family Medicine

## 2015-01-11 ENCOUNTER — Inpatient Hospital Stay: Payer: Medicare Other

## 2015-01-11 ENCOUNTER — Other Ambulatory Visit: Payer: Self-pay | Admitting: Family Medicine

## 2015-01-11 ENCOUNTER — Encounter: Payer: Self-pay | Admitting: Internal Medicine

## 2015-01-11 VITALS — BP 161/69 | HR 79 | Temp 98.4°F | Wt 223.1 lb

## 2015-01-11 DIAGNOSIS — E538 Deficiency of other specified B group vitamins: Secondary | ICD-10-CM | POA: Diagnosis not present

## 2015-01-11 DIAGNOSIS — Z85828 Personal history of other malignant neoplasm of skin: Secondary | ICD-10-CM | POA: Diagnosis not present

## 2015-01-11 DIAGNOSIS — Z8701 Personal history of pneumonia (recurrent): Secondary | ICD-10-CM

## 2015-01-11 DIAGNOSIS — C9141 Hairy cell leukemia, in remission: Secondary | ICD-10-CM | POA: Diagnosis not present

## 2015-01-11 DIAGNOSIS — Z79899 Other long term (current) drug therapy: Secondary | ICD-10-CM | POA: Diagnosis not present

## 2015-01-11 DIAGNOSIS — Z7982 Long term (current) use of aspirin: Secondary | ICD-10-CM | POA: Diagnosis not present

## 2015-01-11 DIAGNOSIS — Z87891 Personal history of nicotine dependence: Secondary | ICD-10-CM | POA: Diagnosis not present

## 2015-01-11 DIAGNOSIS — E785 Hyperlipidemia, unspecified: Secondary | ICD-10-CM

## 2015-01-11 DIAGNOSIS — N400 Enlarged prostate without lower urinary tract symptoms: Secondary | ICD-10-CM | POA: Diagnosis not present

## 2015-01-11 DIAGNOSIS — I10 Essential (primary) hypertension: Secondary | ICD-10-CM | POA: Diagnosis not present

## 2015-01-11 DIAGNOSIS — C9140 Hairy cell leukemia not having achieved remission: Secondary | ICD-10-CM

## 2015-01-11 DIAGNOSIS — K219 Gastro-esophageal reflux disease without esophagitis: Secondary | ICD-10-CM | POA: Diagnosis not present

## 2015-01-11 DIAGNOSIS — E119 Type 2 diabetes mellitus without complications: Secondary | ICD-10-CM

## 2015-01-11 DIAGNOSIS — R5383 Other fatigue: Secondary | ICD-10-CM

## 2015-01-11 DIAGNOSIS — Z794 Long term (current) use of insulin: Secondary | ICD-10-CM | POA: Diagnosis not present

## 2015-01-11 DIAGNOSIS — R531 Weakness: Secondary | ICD-10-CM

## 2015-01-11 LAB — CBC WITH DIFFERENTIAL/PLATELET
Basophils Absolute: 0 10*3/uL (ref 0–0.1)
EOS ABS: 0 10*3/uL (ref 0–0.7)
Eosinophils Relative: 3 %
HCT: 29 % — ABNORMAL LOW (ref 40.0–52.0)
HEMOGLOBIN: 9.8 g/dL — AB (ref 13.0–18.0)
LYMPHS ABS: 1 10*3/uL (ref 1.0–3.6)
MCH: 36.9 pg — AB (ref 26.0–34.0)
MCHC: 33.9 g/dL (ref 32.0–36.0)
MCV: 108.6 fL — ABNORMAL HIGH (ref 80.0–100.0)
Monocytes Absolute: 0 10*3/uL — ABNORMAL LOW (ref 0.2–1.0)
NEUTROS ABS: 0.8 10*3/uL — AB (ref 1.4–6.5)
PLATELETS: 145 10*3/uL — AB (ref 150–440)
RBC: 2.67 MIL/uL — AB (ref 4.40–5.90)
RDW: 14.8 % — ABNORMAL HIGH (ref 11.5–14.5)
WBC: 1.9 10*3/uL — ABNORMAL LOW (ref 3.8–10.6)

## 2015-01-11 NOTE — Progress Notes (Signed)
[place]  Telephone:(336) [phone]  Fax:(336) [place]: [birth year]  MR#: [id number]  BJS#:[id number]  Patient Care Team: Ria Bush, MD as PCP - General (Family Medicine)  CHIEF COMPLAINT:  Chief Complaint  Patient presents with  . Follow-up    INTERVAL HISTORY:  Patient is here for further evaluation and treatment consideration regarding hairy cell leukemia. He has been started on oral iron recently per Dr. Inez Pilgrim. Patient has been taking for approximately 2 weeks. He continues with fatigue and weakness. He denies any fever, chills, nausea, vomiting, diarrhea, cough, shortness of breath, or chest pain. He has not brought stool cards back for testing. He has been in follow up regarding hairy cell leukemia and previous history of MSpike for many years.   REVIEW OF SYSTEMS:   Review of Systems  Constitutional: Positive for malaise/fatigue.  Neurological: Positive for weakness.  All other systems reviewed and are negative.   As per HPI. Otherwise, a complete review of systems is negatve.  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 Hypertension   . GERD (gastroesophageal reflux disease)   . HLD (hyperlipidemia)   . History of pneumonia 2000's    "once" (07/07/2012)  . Type II diabetes mellitus   . Hairy cell leukemia 2006    followed by Dr. Cynda Acres  . Basal cell carcinoma of face   . Diverticulosis   . Colon polyps   . BPH (benign prostatic hypertrophy)     followed by urology, discharged (Dr. Bernardo Heater)  . B12 deficiency     PAST SURGICAL HISTORY: Past Surgical History  Procedure Laterality Date  . Eye surgery Left 06/2012    laser surgery  . Cataract extraction w/ intraocular lens  implant, bilateral  ~ 2010  . Skin cancer excision      "all over my face"  (07/07/2012)  . 25 gauge pars plana vitrectomy with 20 gauge mvr port for macular hole  07/07/2012    Procedure: 25 GAUGE PARS PLANA VITRECTOMY WITH 20 GAUGE MVR PORT FOR MACULAR HOLE;  Surgeon: Hayden Pedro, MD;  Location: [facility];  Service: Ophthalmology;  Laterality: Left;  . Serum patch  07/07/2012    Procedure: SERUM PATCH;  Surgeon: Hayden Pedro, MD;  Location: [facility];  Service: Ophthalmology;  Laterality: Left;  . Gas insertion  07/07/2012    Procedure: INSERTION OF GAS;  Surgeon: Hayden Pedro, MD;  Location: [facility];  Service: Ophthalmology;  Laterality: Left;  C3F8  . Cardiovascular stress test  2013    treadmill - no evidence ischemia, EF 61%  . Colonoscopy  2014    [facility] WNL no rpt needed, h/o polyps    FAMILY HISTORY Family History  Problem Relation Age of Onset  . Dementia Mother   . Heart failure Father 43  . Cancer Sister     breast  . Diabetes Paternal Uncle   . Diabetes Paternal Aunt   . CAD Brother 26    MI  . Stroke Neg Hx     GYNECOLOGIC HISTORY:  No LMP for male patient.     ADVANCED DIRECTIVES:    HEALTH MAINTENANCE: History  Substance Use Topics  . Smoking status: Former Smoker -- 2.00 packs/day for 25 years    Types: Cigarettes  . Smokeless tobacco: Never Used  Comment: 07/07/2012 "stopped smoking ~ 40 yr ago; smoked 20-61yr"  . Alcohol Use: 0.0 oz/week    0 Standard drinks or equivalent per week     Comment: rare     Colonoscopy:  PAP:  Bone density:  Lipid panel:  Allergies  Allergen Reactions  . Sulfa Antibiotics Itching and Rash    Current Outpatient Prescriptions  Medication Sig Dispense Refill  . acyclovir (ZOVIRAX) 400 MG tablet Take 1 tablet by mouth 2 (two) times daily. For leukemia    . aspirin EC 81 MG tablet Take 81 mg by mouth daily.    Marland Kitchen glucose blood (ACCU-CHEK AVIVA PLUS) test strip Use to check sugar twice daily Dx:E11.9 200 each 3  . hydrochlorothiazide (HYDRODIURIL) 25 MG tablet Take 1 tablet (25 mg total)  by mouth daily.    . insulin NPH Human (HUMULIN N) 100 UNIT/ML injection Inject 0.35 mLs (35 Units total) into the skin 2 (two) times daily before a meal. 20 mL 11  . insulin regular (HUMULIN R) 100 units/mL injection Inject 0.1 mLs (10 Units total) into the skin 2 (two) times daily before a meal. 10 mL 11  . lisinopril (PRINIVIL,ZESTRIL) 20 MG tablet TAKE ONE TABLET BY MOUTH TWICE DAILY 60 tablet 3  . metFORMIN (GLUCOPHAGE) 1000 MG tablet TAKE ONE TABLET BY MOUTH TWICE DAILY AS DIRECTED 180 tablet 1  . omeprazole (PRILOSEC) 20 MG capsule Take 40 mg by mouth daily.     . pravastatin (PRAVACHOL) 20 MG tablet TAKE ONE TABLET AT BEDTIME 30 tablet 6  . tamsulosin (FLOMAX) 0.4 MG CAPS capsule Take 0.4 mg by mouth daily.    . vitamin B-12 (CYANOCOBALAMIN) 1000 MCG tablet Take 1,000 mcg by mouth daily.    . vitamin E 400 UNIT capsule Take 400 Units by mouth daily.    . fluticasone (FLONASE) 50 MCG/ACT nasal spray Place 2 sprays into both nostrils daily. (Patient not taking: Reported on 01/11/2015) 16 g 3   No current facility-administered medications for this visit.    OBJECTIVE: BP 161/69 mmHg  Pulse 79  Temp(Src) 98.4 F (36.9 C) (Tympanic)  Wt 223 lb 1.7 oz (101.2 kg)   Body mass index is 34.94 kg/(m^2).    ECOG FS:1 - Symptomatic but completely ambulatory  General: Well-developed, well-nourished, no acute distress. HEENT: Normocephalic, moist mucous membranes, clear oropharnyx. Lungs: Clear to auscultation bilaterally. Heart: Regular rate and rhythm. No rubs, murmurs, or gallops. Abdomen: Soft, nontender, nondistended. No organomegaly noted, normoactive bowel sounds. Musculoskeletal: No edema, cyanosis, or clubbing. Neuro: Alert, answering all questions appropriately. Cranial nerves grossly intact. Skin: No rashes or petechiae noted. Psych: Normal affect.  LAB RESULTS:     Component Value Date/Time   NA 138 10/05/2014 1018   K 4.3 10/05/2014 1018   K 4.5 09/19/2014 1511   CL 104  10/05/2014 1018   CO2 28 10/05/2014 1018   GLUCOSE 166* 10/05/2014 1018   BUN 30* 10/05/2014 1018   CREATININE 1.07 12/28/2014 1456   CREATININE 1.20 12/14/2014 1109   CREATININE 0.8 08/20/2013   CALCIUM 9.6 10/05/2014 1018   PROT 7.7 12/28/2014 1456   PROT 7.5 01/10/2012 1539   ALBUMIN 4.1 12/28/2014 1456   ALBUMIN 4.0 01/10/2012 1539   AST 19 12/28/2014 1456   AST 16 08/20/2013   AST 22 01/10/2012 1539   ALT 18 12/28/2014 1456   ALT 29 01/10/2012 1539   ALKPHOS 55 12/28/2014 1456   ALKPHOS 53 08/20/2013   ALKPHOS 84 01/10/2012 1539  BILITOT 0.4 12/28/2014 1456   BILITOT 0.6 08/20/2013   GFRNONAA >60 12/28/2014 1456   GFRNONAA 58* 12/14/2014 1109   GFRAA >60 12/28/2014 1456   GFRAA >60 12/14/2014 1109    No results found for: SPEP, UPEP  Lab Results  Component Value Date   WBC 1.9* 01/11/2015   NEUTROABS 0.8* 01/11/2015   HGB 9.8* 01/11/2015   HCT 29.0* 01/11/2015   MCV 108.6* 01/11/2015   PLT 145* 01/11/2015      Chemistry      Component Value Date/Time   NA 138 10/05/2014 1018   K 4.3 10/05/2014 1018   K 4.5 09/19/2014 1511   CL 104 10/05/2014 1018   CO2 28 10/05/2014 1018   BUN 30* 10/05/2014 1018   CREATININE 1.07 12/28/2014 1456   CREATININE 1.20 12/14/2014 1109   CREATININE 0.8 08/20/2013      Component Value Date/Time   CALCIUM 9.6 10/05/2014 1018   ALKPHOS 55 12/28/2014 1456   ALKPHOS 53 08/20/2013   ALKPHOS 84 01/10/2012 1539   AST 19 12/28/2014 1456   AST 16 08/20/2013   AST 22 01/10/2012 1539   ALT 18 12/28/2014 1456   ALT 29 01/10/2012 1539   BILITOT 0.4 12/28/2014 1456   BILITOT 0.6 08/20/2013       No results found for: LABCA2  No components found for: TMHDQ222  No results for input(s): INR in the last 168 hours.  No results found for: COLORURINE, APPEARANCEUR, LABSPEC, PHURINE, GLUCOSEU, HGBUR, BILIRUBINUR, KETONESUR, PROTEINUR, UROBILINOGEN, NITRITE, LEUKOCYTESUR  STUDIES: No results found.  ASSESSMENT:  Hairy Cell  Leukemia. History of M-Spike.  PLAN:   1. Hairy cell leukemia. Patient continues to have drop in WBCs as well as Hgb. Has been having labs checked every 2 weeks by Dr. Inez Pilgrim. Discussed transfer of care to another Oncologist/ Hematologist and he would like to see Dr. [name].  Advised patient that we will continue to check his labs again in 2 weeks and will have him see Dr. [name] at that time to discuss need for BM biopsy to determine if patient is truly relapsing. 2. Mspike. SIEP and light chains will also be collected at next visit for continued monitoring.   Patient expressed understanding and was in agreement with this plan. He also understands that He can call clinic at any time with any questions, concerns, or complaints.   Dr. Grayland Ormond was available for consultation and review of plan of care for this patient.   Evlyn Kanner, NP   01/11/2015 5:34 PM

## 2015-01-23 DIAGNOSIS — L570 Actinic keratosis: Secondary | ICD-10-CM | POA: Diagnosis not present

## 2015-01-23 DIAGNOSIS — Z85828 Personal history of other malignant neoplasm of skin: Secondary | ICD-10-CM | POA: Diagnosis not present

## 2015-01-30 ENCOUNTER — Telehealth: Payer: Self-pay | Admitting: *Deleted

## 2015-01-30 DIAGNOSIS — C9140 Hairy cell leukemia not having achieved remission: Secondary | ICD-10-CM

## 2015-01-30 MED ORDER — ACYCLOVIR 400 MG PO TABS: 400.0000 mg | ORAL_TABLET | Freq: Two times a day (BID) | ORAL | Status: DC

## 2015-01-30 NOTE — Telephone Encounter (Signed)
Rx Escribed

## 2015-01-31 ENCOUNTER — Encounter: Payer: Self-pay | Admitting: Primary Care

## 2015-01-31 ENCOUNTER — Ambulatory Visit (INDEPENDENT_AMBULATORY_CARE_PROVIDER_SITE_OTHER)
Admission: RE | Admit: 2015-01-31 | Discharge: 2015-01-31 | Disposition: A | Discharge status: Home / Self Care | Payer: Medicare Other | Source: Ambulatory Visit | Attending: Primary Care | Admitting: Primary Care

## 2015-01-31 ENCOUNTER — Ambulatory Visit (INDEPENDENT_AMBULATORY_CARE_PROVIDER_SITE_OTHER): Payer: Medicare Other | Admitting: Primary Care

## 2015-01-31 ENCOUNTER — Ambulatory Visit: Admission: RE | Admit: 2015-01-31 | Payer: Medicare Other | Source: Ambulatory Visit

## 2015-01-31 VITALS — BP 144/68 | HR 85 | Temp 98.7°F | Ht 67.0 in | Wt 222.8 lb

## 2015-01-31 DIAGNOSIS — R05 Cough: Secondary | ICD-10-CM

## 2015-01-31 DIAGNOSIS — R059 Cough, unspecified: Secondary | ICD-10-CM

## 2015-01-31 DIAGNOSIS — R350 Frequency of micturition: Secondary | ICD-10-CM

## 2015-01-31 DIAGNOSIS — R509 Fever, unspecified: Secondary | ICD-10-CM | POA: Diagnosis not present

## 2015-01-31 LAB — POCT URINALYSIS DIPSTICK
BILIRUBIN UA: NEGATIVE
Ketones, UA: NEGATIVE
LEUKOCYTES UA: NEGATIVE
NITRITE UA: NEGATIVE
Spec Grav, UA: 1.025
Urobilinogen, UA: NEGATIVE
pH, UA: 6

## 2015-01-31 IMAGING — CR DG CHEST 2V
2 series · 2 of 2 positions shown · non-contrast
Comparison: [DATE] and earlier.

CLINICAL DATA: 80-year-old male with nonproductive cough for 2 days
and intermittent fever. Smoker. Initial encounter.

EXAM:
CHEST  2 VIEW

[view not recorded (1 of 2)]
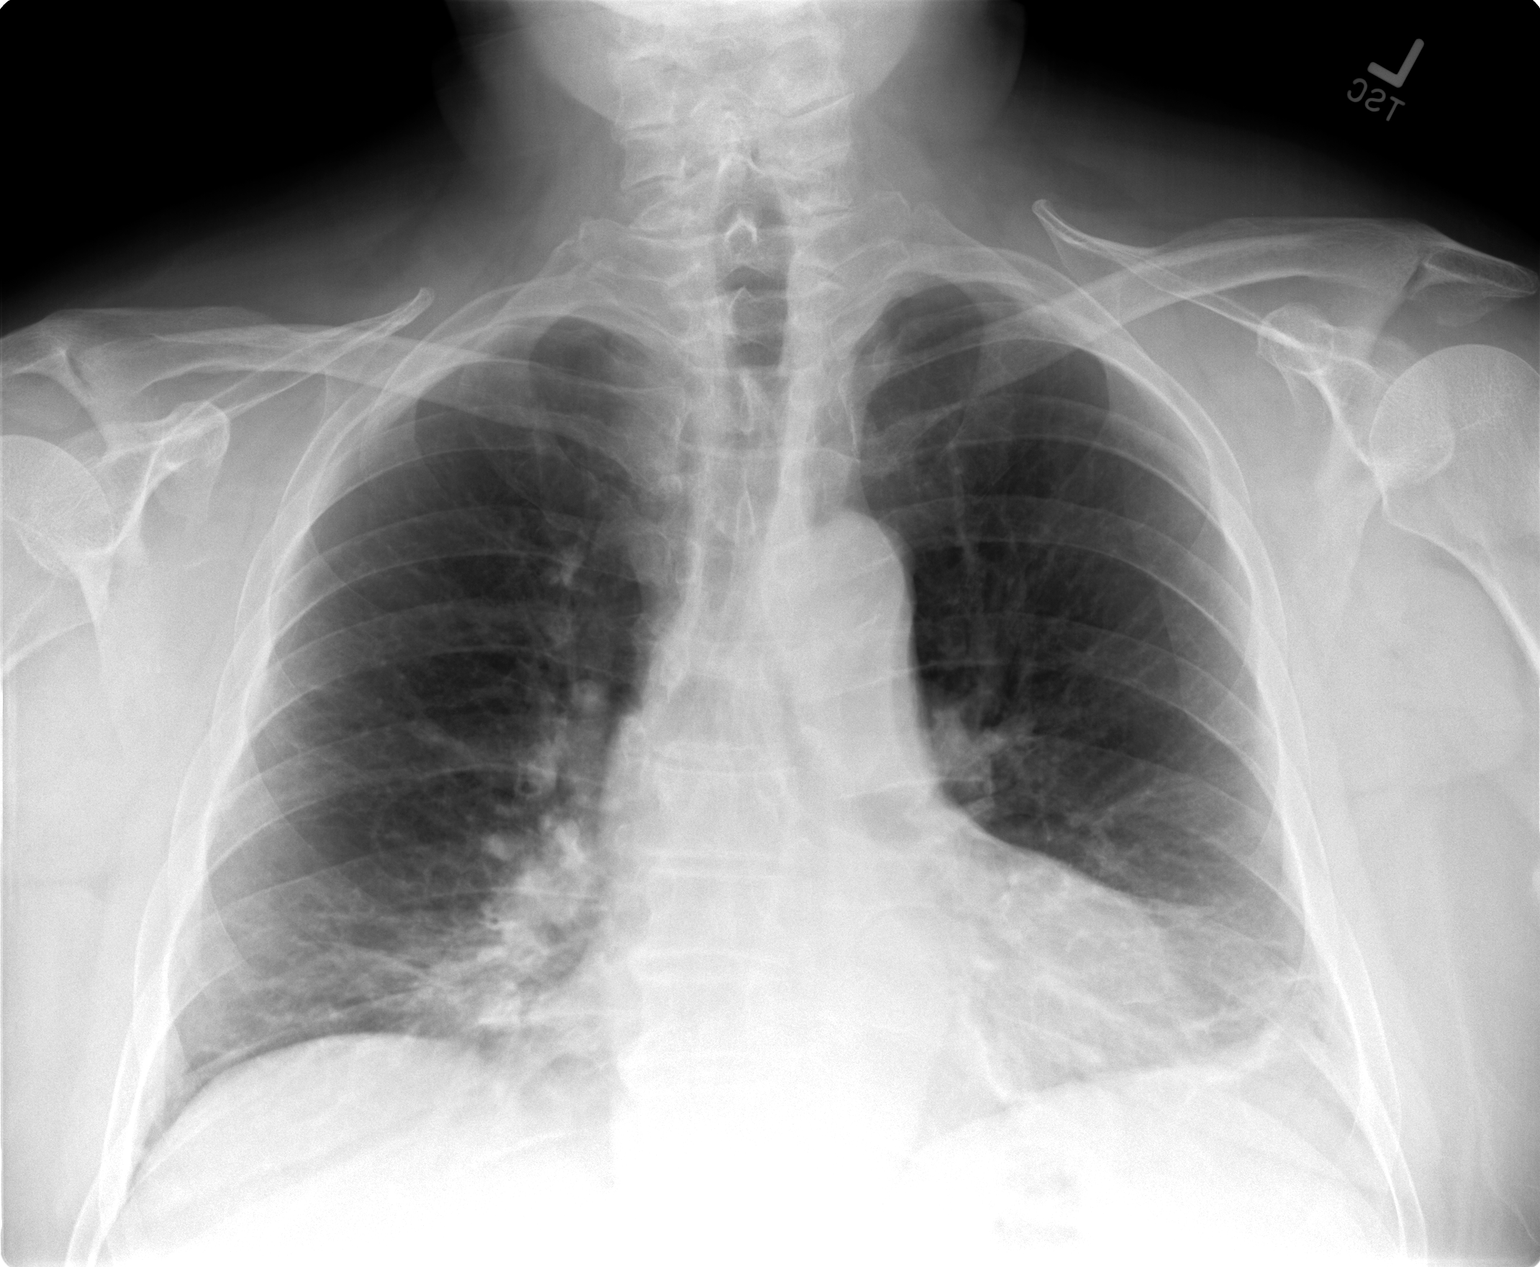

[view not recorded (2 of 2)]
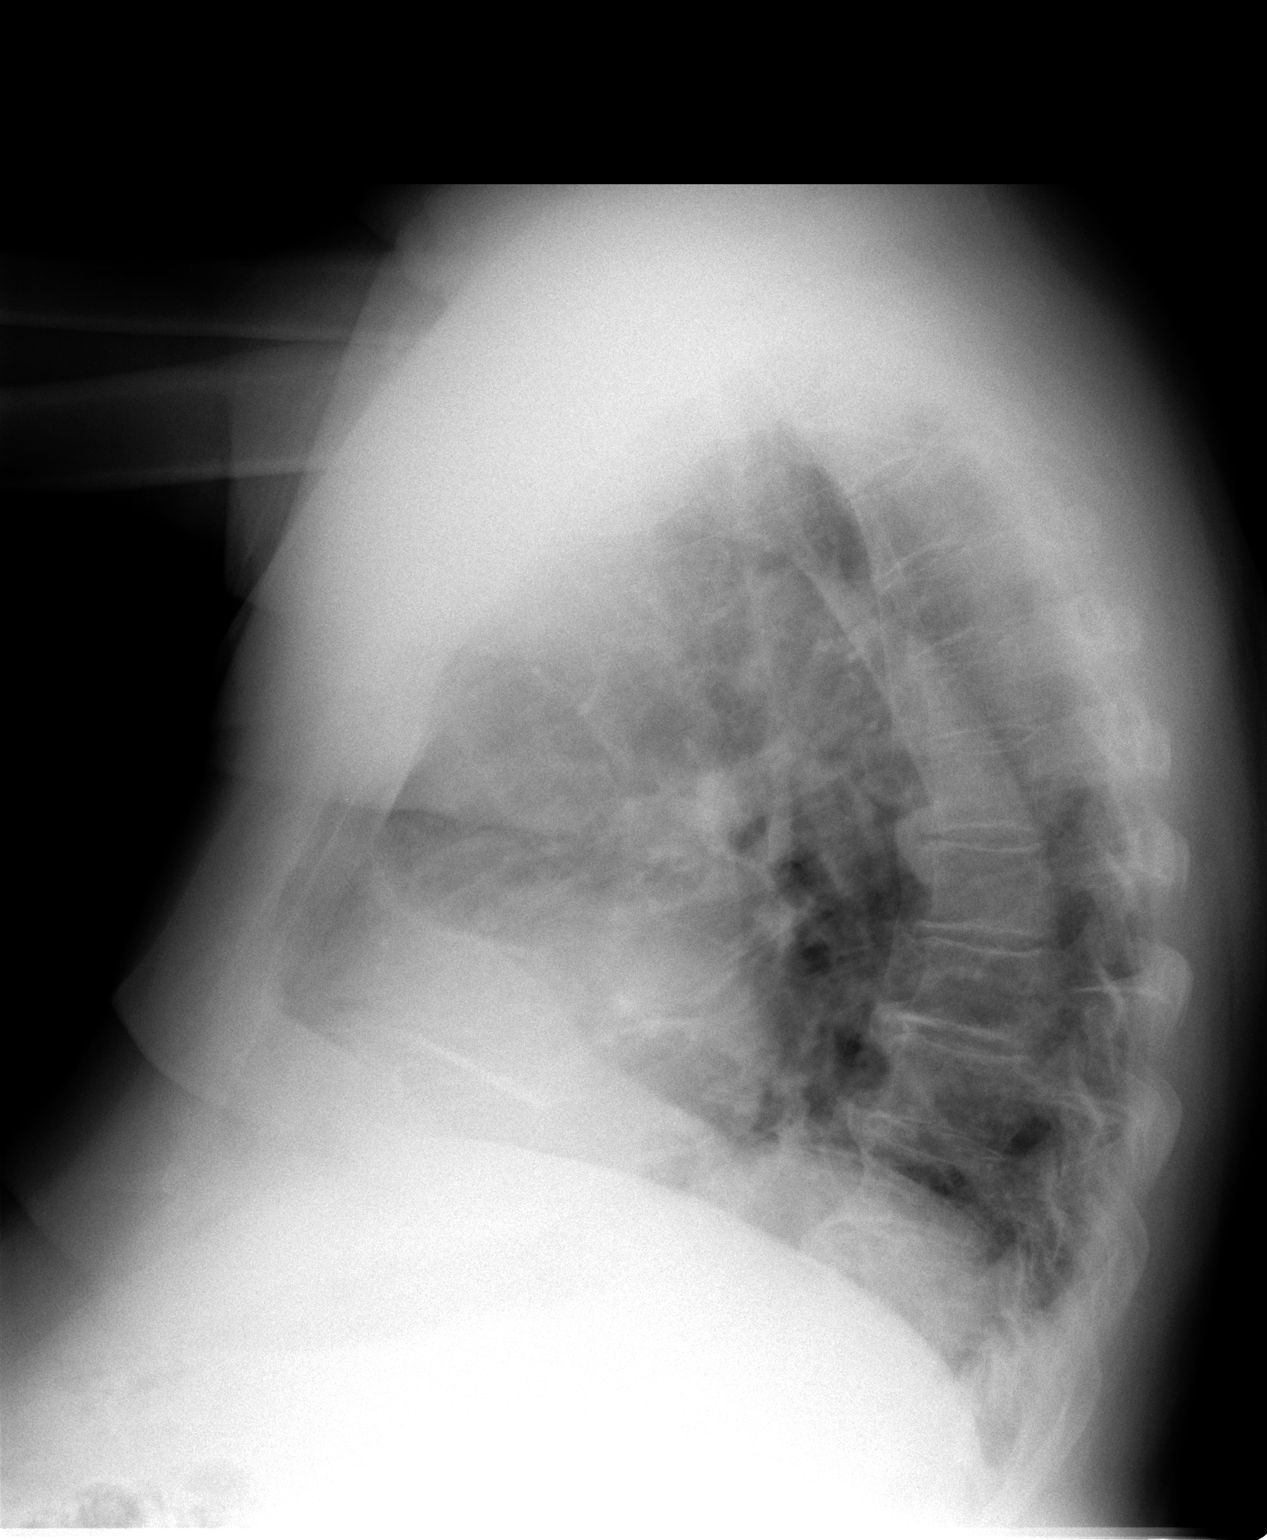

[2 of 2 positions shown; findings below may reference images not displayed]

FINDINGS: Lower lung volumes. Bibasilar curvilinear opacity similar but mildly
increased when compared to the prior study. No pneumothorax or
pulmonary edema. No pleural effusion or consolidation. Stable
cardiac size and mediastinal contours. Visualized tracheal air
column is within normal limits. No acute osseous abnormality
identified.
IMPRESSION: Lower lung volumes with acute on chronic streaky bibasilar opacity
suspicious for acute bronchopneumonia superimposed on lung base
scarring. No pleural effusion.

These results will be called to the ordering clinician or
representative by the Radiologist Assistant, and communication
documented in the PACS or zVision Dashboard.

## 2015-01-31 NOTE — Patient Instructions (Signed)
Complete lab work prior to leaving today. I will notify you of your results. Complete xray(s) prior to leaving today. I will contact you regarding your results. Your urine test did not show infection; however, the incontinence is likely related to your blood sugars.  Drink plenty of fluids and get some rest. I will be in touch soon!

## 2015-01-31 NOTE — Progress Notes (Signed)
Subjective:    Patient ID: Alex Smith, male    DOB: 1935/03/04, 80 y.o.   MRN: [id number]  HPI  Mr. Lolli is a 80 year old male who presents today with a chief complaint of non-productive cough. His cough began two days ago with a fever of 100.4 (yesterday and today). He also reports dizziness with position change and urinary incontinence. His blood sugars today have been more elevated in the mid 200's. He's been taking Robitussin OTC cough syrup with improvement in cough. He has a history of hairy cell leukemia. Overall he's feeling weak and tired.  Review of Systems  Constitutional: Positive for fever and fatigue. Negative for chills.  HENT: Positive for sore throat. Negative for congestion, rhinorrhea and sinus pressure.   Respiratory: Positive for cough. Negative for shortness of breath.   Cardiovascular: Negative for chest pain.  Gastrointestinal: Negative for nausea and abdominal pain.  Genitourinary: Positive for urgency and frequency. Negative for dysuria and hematuria.  Neurological: Positive for dizziness. Negative for headaches.       Past Medical History  Diagnosis Date  . Hypertension   . GERD (gastroesophageal reflux disease)   . HLD (hyperlipidemia)   . History of pneumonia 2000's    "once" (07/07/2012)  . Type II diabetes mellitus   . Hairy cell leukemia 2006    followed by Dr. Cynda Acres  . Basal cell carcinoma of face   . Diverticulosis   . Colon polyps   . BPH (benign prostatic hypertrophy)     followed by urology, discharged (Dr. Bernardo Heater)  . B12 deficiency     History   Social History  . Marital Status: Married    Spouse Name: N/A  . Number of Children: N/A  . Years of Education: N/A   Occupational History  . Not on file.   Social History Main Topics  . Smoking status: Former Smoker -- 2.00 packs/day for 25 years    Types: Cigarettes  . Smokeless tobacco: Never Used     Comment: 07/07/2012 "stopped smoking ~ 40 yr ago; smoked 20-57yr"  .  Alcohol Use: 0.0 oz/week    0 Standard drinks or equivalent per week     Comment: rare  . Drug Use: No  . Sexual Activity: No   Other Topics Concern  . Not on file   Social History Narrative   Married >50 yrs, cares for wife. 1 cat   Occupation: was Art gallery manager, still works for [employer]   Activity: likes to golf   Diet: moderate water, fruits/vegetables daily    Past Surgical History  Procedure Laterality Date  . Eye surgery Left 06/2012    laser surgery  . Cataract extraction w/ intraocular lens  implant, bilateral  ~ 2010  . Skin cancer excision      "all over my face" (07/07/2012)  . 25 gauge pars plana vitrectomy with 20 gauge mvr port for macular hole  07/07/2012    Procedure: 25 GAUGE PARS PLANA VITRECTOMY WITH 20 GAUGE MVR PORT FOR MACULAR HOLE;  Surgeon: Hayden Pedro, MD;  Location: [facility];  Service: Ophthalmology;  Laterality: Left;  . Serum patch  07/07/2012    Procedure: SERUM PATCH;  Surgeon: Hayden Pedro, MD;  Location: [facility];  Service: Ophthalmology;  Laterality: Left;  . Gas insertion  07/07/2012    Procedure: INSERTION OF GAS;  Surgeon: Hayden Pedro, MD;  Location: [facility];  Service: Ophthalmology;  Laterality: Left;  C3F8  . Cardiovascular  stress test  2013    treadmill - no evidence ischemia, EF 61%  . Colonoscopy  2014    [facility] WNL no rpt needed, h/o polyps    Family History  Problem Relation Age of Onset  . Dementia Mother   . Heart failure Father 67  . Cancer Sister     breast  . Diabetes Paternal Uncle   . Diabetes Paternal Aunt   . CAD Brother 30    MI  . Stroke Neg Hx     Allergies  Allergen Reactions  . Sulfa Antibiotics Itching and Rash    Current Outpatient Prescriptions on File Prior to Visit  Medication Sig Dispense Refill  . acyclovir (ZOVIRAX) 400 MG tablet Take 1 tablet (400 mg total) by mouth 2 (two) times daily. For leukemia 60 tablet 0  . aspirin EC 81 MG tablet Take 81 mg by mouth daily.    . fluticasone  (FLONASE) 50 MCG/ACT nasal spray Place 2 sprays into both nostrils daily. 16 g 3  . glucose blood (ACCU-CHEK AVIVA PLUS) test strip Use to check sugar twice daily Dx:E11.9 200 each 3  . hydrochlorothiazide (HYDRODIURIL) 25 MG tablet Take 1 tablet (25 mg total) by mouth daily.    . insulin NPH Human (HUMULIN N) 100 UNIT/ML injection Inject 0.35 mLs (35 Units total) into the skin 2 (two) times daily before a meal. 20 mL 11  . insulin regular (HUMULIN R) 100 units/mL injection Inject 0.1 mLs (10 Units total) into the skin 2 (two) times daily before a meal. 10 mL 11  . lisinopril (PRINIVIL,ZESTRIL) 20 MG tablet TAKE ONE TABLET BY MOUTH TWICE DAILY 60 tablet 3  . metFORMIN (GLUCOPHAGE) 1000 MG tablet TAKE ONE TABLET BY MOUTH TWICE DAILY AS DIRECTED 180 tablet 1  . omeprazole (PRILOSEC) 20 MG capsule Take 40 mg by mouth daily.     . pravastatin (PRAVACHOL) 20 MG tablet TAKE ONE TABLET AT BEDTIME 30 tablet 6  . tamsulosin (FLOMAX) 0.4 MG CAPS capsule Take 0.4 mg by mouth daily.    . vitamin B-12 (CYANOCOBALAMIN) 1000 MCG tablet Take 1,000 mcg by mouth daily.    . vitamin E 400 UNIT capsule Take 400 Units by mouth daily.     No current facility-administered medications on file prior to visit.    BP 144/68 mmHg  Pulse 85  Temp(Src) 98.7 F (37.1 C) (Oral)  Ht 5\' 7"  (1.702 m)  Wt 222 lb 12.8 oz (101.061 kg)  BMI 34.89 kg/m2  SpO2 95%    Objective:   Physical Exam  Constitutional: He is oriented to person, place, and time. He does not appear ill.  HENT:  Right Ear: Tympanic membrane and ear canal normal.  Left Ear: Tympanic membrane and ear canal normal.  Nose: Right sinus exhibits no maxillary sinus tenderness and no frontal sinus tenderness. Left sinus exhibits no maxillary sinus tenderness and no frontal sinus tenderness.  Mouth/Throat: Oropharynx is clear and moist.  Neck: Neck supple.  Cardiovascular: Normal rate and regular rhythm.   Pulmonary/Chest: Effort normal and breath sounds  normal.  Neurological: He is alert and oriented to person, place, and time.  Skin: Skin is warm and dry.          Assessment & Plan:  Cough:  Afebrile in clinic today. No cough on exam, exam overall was mostly unremarkable. Lungs clear, but due to fevers with some SOB will get chest xray to rule out pneumonia. CBC, BMP today. UA completed in office today  due to reports of increased frequency and incontinence: negative for leuks and nitrites. Positive for glucose and protein.  Push water intake. Instructed patient and wife to call me if fever returned or if symptoms worsened. Will await xray for antibiotics at this point.

## 2015-02-01 ENCOUNTER — Other Ambulatory Visit: Payer: Self-pay | Admitting: Primary Care

## 2015-02-01 ENCOUNTER — Encounter: Payer: Self-pay | Admitting: *Deleted

## 2015-02-01 ENCOUNTER — Telehealth: Payer: Self-pay | Admitting: Radiology

## 2015-02-01 ENCOUNTER — Ambulatory Visit
Admission: RE | Admit: 2015-02-01 | Discharge: 2015-02-01 | Disposition: A | Discharge status: Home / Self Care | Payer: Medicare Other | Source: Ambulatory Visit | Attending: Family Medicine | Admitting: Family Medicine

## 2015-02-01 ENCOUNTER — Telehealth: Payer: Self-pay

## 2015-02-01 DIAGNOSIS — K8020 Calculus of gallbladder without cholecystitis without obstruction: Secondary | ICD-10-CM | POA: Diagnosis not present

## 2015-02-01 DIAGNOSIS — C9140 Hairy cell leukemia not having achieved remission: Secondary | ICD-10-CM | POA: Insufficient documentation

## 2015-02-01 DIAGNOSIS — K8080 Other cholelithiasis without obstruction: Secondary | ICD-10-CM | POA: Diagnosis not present

## 2015-02-01 DIAGNOSIS — J189 Pneumonia, unspecified organism: Secondary | ICD-10-CM

## 2015-02-01 LAB — BASIC METABOLIC PANEL
BUN: 22 mg/dL (ref 6–23)
CHLORIDE: 100 meq/L (ref 96–112)
CO2: 29 mEq/L (ref 19–32)
Calcium: 9.4 mg/dL (ref 8.4–10.5)
Creatinine, Ser: 1.18 mg/dL (ref 0.40–1.50)
GFR: 63.41 mL/min (ref >60.00)
GLUCOSE: 195 mg/dL — AB (ref 70–99)
Potassium: 4.2 mEq/L (ref 3.5–5.1)
SODIUM: 133 meq/L — AB (ref 135–145)

## 2015-02-01 LAB — CBC
HCT: 27.5 % — ABNORMAL LOW (ref 39.0–52.0)
Hemoglobin: 9.4 g/dL — ABNORMAL LOW (ref 13.0–17.0)
MCHC: 34.4 g/dL (ref 30.0–36.0)
MCV: 107.8 fl — ABNORMAL HIGH (ref 78.0–100.0)
Platelets: 144 10*3/uL — ABNORMAL LOW (ref 150.0–400.0)
RBC: 2.55 Mil/uL — ABNORMAL LOW (ref 4.22–5.81)
RDW: 15 % (ref 11.5–15.5)
WBC: 1.8 10*3/uL — AB (ref 4.0–10.5)

## 2015-02-01 MED ORDER — LEVOFLOXACIN 500 MG PO TABS: 500.0000 mg | ORAL_TABLET | Freq: Every day | ORAL | Status: DC

## 2015-02-01 NOTE — Telephone Encounter (Signed)
[facility] lab called a critical WBC - 1.8. Results given to Allie Bossier, NP

## 2015-02-01 NOTE — Telephone Encounter (Signed)
Noted. This is a chronic issue and he's meeting with his new oncologist this Friday. Patient will be notified of results.

## 2015-02-01 NOTE — Telephone Encounter (Signed)
[facility] radiology called report cxr; in the pts chart and Allie Bossier NP notified by walking report to her.

## 2015-02-01 NOTE — Telephone Encounter (Signed)
Noted. Antibiotics already sent to pharmacy this morning. Patient aware.

## 2015-02-03 ENCOUNTER — Other Ambulatory Visit: Payer: Medicare Other

## 2015-02-03 ENCOUNTER — Ambulatory Visit: Payer: Medicare Other | Admitting: Hematology and Oncology

## 2015-02-03 ENCOUNTER — Ambulatory Visit: Payer: Medicare Other

## 2015-02-03 ENCOUNTER — Inpatient Hospital Stay: Payer: Medicare Other | Attending: Hematology and Oncology | Admitting: Hematology and Oncology

## 2015-02-03 ENCOUNTER — Encounter: Payer: Self-pay | Admitting: Hematology and Oncology

## 2015-02-03 ENCOUNTER — Inpatient Hospital Stay: Payer: Medicare Other

## 2015-02-03 VITALS — BP 190/95 | HR 88 | Temp 97.0°F | Resp 16 | Wt 220.2 lb

## 2015-02-03 DIAGNOSIS — D472 Monoclonal gammopathy: Secondary | ICD-10-CM | POA: Diagnosis not present

## 2015-02-03 DIAGNOSIS — C9140 Hairy cell leukemia not having achieved remission: Secondary | ICD-10-CM | POA: Insufficient documentation

## 2015-02-03 DIAGNOSIS — Z794 Long term (current) use of insulin: Secondary | ICD-10-CM | POA: Diagnosis not present

## 2015-02-03 DIAGNOSIS — N400 Enlarged prostate without lower urinary tract symptoms: Secondary | ICD-10-CM

## 2015-02-03 DIAGNOSIS — Z79899 Other long term (current) drug therapy: Secondary | ICD-10-CM

## 2015-02-03 DIAGNOSIS — Z87891 Personal history of nicotine dependence: Secondary | ICD-10-CM | POA: Diagnosis not present

## 2015-02-03 DIAGNOSIS — E538 Deficiency of other specified B group vitamins: Secondary | ICD-10-CM

## 2015-02-03 DIAGNOSIS — E785 Hyperlipidemia, unspecified: Secondary | ICD-10-CM | POA: Diagnosis not present

## 2015-02-03 DIAGNOSIS — E119 Type 2 diabetes mellitus without complications: Secondary | ICD-10-CM | POA: Insufficient documentation

## 2015-02-03 DIAGNOSIS — R5383 Other fatigue: Secondary | ICD-10-CM

## 2015-02-03 DIAGNOSIS — R531 Weakness: Secondary | ICD-10-CM | POA: Insufficient documentation

## 2015-02-03 DIAGNOSIS — D539 Nutritional anemia, unspecified: Secondary | ICD-10-CM

## 2015-02-03 DIAGNOSIS — Z85828 Personal history of other malignant neoplasm of skin: Secondary | ICD-10-CM | POA: Diagnosis not present

## 2015-02-03 DIAGNOSIS — I10 Essential (primary) hypertension: Secondary | ICD-10-CM

## 2015-02-03 DIAGNOSIS — K219 Gastro-esophageal reflux disease without esophagitis: Secondary | ICD-10-CM | POA: Diagnosis not present

## 2015-02-03 LAB — CBC WITH DIFFERENTIAL/PLATELET
BASOS ABS: 0 10*3/uL (ref 0–0.1)
Basophils Relative: 1 %
EOS ABS: 0 10*3/uL (ref 0–0.7)
HEMATOCRIT: 29.9 % — AB (ref 40.0–52.0)
Hemoglobin: 10 g/dL — ABNORMAL LOW (ref 13.0–18.0)
Lymphs Abs: 0.8 10*3/uL — ABNORMAL LOW (ref 1.0–3.6)
MCH: 36.2 pg — ABNORMAL HIGH (ref 26.0–34.0)
MCHC: 33.3 g/dL (ref 32.0–36.0)
MCV: 108.6 fL — ABNORMAL HIGH (ref 80.0–100.0)
MONO ABS: 0 10*3/uL — AB (ref 0.2–1.0)
Monocytes Relative: 3 %
NEUTROS ABS: 0.9 10*3/uL — AB (ref 1.4–6.5)
PLATELETS: 165 10*3/uL (ref 150–440)
RBC: 2.76 MIL/uL — ABNORMAL LOW (ref 4.40–5.90)
RDW: 14.5 % (ref 11.5–14.5)
WBC: 1.8 10*3/uL — AB (ref 3.8–10.6)

## 2015-02-03 NOTE — Progress Notes (Signed)
   02/03/15 0945  Clinical Encounter Type  Visited With Patient and family together  Visit Type Initial  Visited with pt and his wife in cancer center. Pt said it was his second round of treatment and he was not feeling so well today.  Pt's wife said she was doing well and expressed her hope that her husband would be feeling better soon.  Provided pastoral presence and support.  Pt and wife thanked me for my visit.  Arrie Senate Korie Streat-pager [phone]

## 2015-02-03 NOTE — Progress Notes (Signed)
[facility] day:  02/03/2015  Chief Complaint: Alex Smith is a 80 y.o. male with a history of hairy cell leukemia who is seen for reassessment.  HPI:  The patient was diagnosed with hairy cell leukemia in 2006 after presenting with a fever and infection. Blood counts were low. Notes indicate a diagnosis of hairy cell leukemia was based on bone marrow biopsy immunostaining and flow cytometry. Notes also indicate bone marrow aspirate 2 showed no material available for analysis and no cytogenetic or FISH analysis. Studies included negative HIV testing, hepatitis B and C testing.  The patient notes a questionable seizure with the first chemotherapy that he received. He was treated with cladribine from 11/6-11/05/2005. He apparently had a PRBC transfusion reaction. He has subsequently required washed red cells. Notes also indicate a possible allergy to Primaxin, voriconazole, Zoloft and allopurinol. He only notes a allergy to sulfa.  His is course was complicated by a prostate abscess on 112/21/2006. He received a course of Levaquin.  The patient has an unclear history of a monoclonal spike. Notes indicate a monoclonal IgG ranging  from 600-900 (high as 1000 in 10/2011).  CLL testing was negative.  Abdominal ultrasound on 11/18/2014 revealed a spleen size of 7.2 cm and (normal).  Labs on 12/22/2014 included hemoglobin 29, hematocrit of 29, hemoglobin 9.8, MCV 108.6, platelets 144,000 145,000, white count 1900 with an [place] of 800.  He has had a general decline in health with fatigue and weakness over the past few weeks. He was diagnosed recently with pneumonia by chest x-ray. At time, he denies any fever, cough or sweats. He was started on Levaquin. He has had sweats since and an isolated temperature 100.4 on one day. Symptomatically, he has improved as before he could not walk secondary to fatigue and is now ambulating without difficulty.  Past Medical  History  Diagnosis Date  . Hypertension   . GERD (gastroesophageal reflux disease)   . HLD (hyperlipidemia)   . History of pneumonia 2000's    "once" (07/07/2012)  . Type II diabetes mellitus   . Hairy cell leukemia 2006    followed by Dr. Cynda Acres  . Basal cell carcinoma of face   . Diverticulosis   . Colon polyps   . BPH (benign prostatic hypertrophy)     followed by urology, discharged (Dr. Bernardo Heater)  . B12 deficiency     Past Surgical History  Procedure Laterality Date  . Eye surgery Left 06/2012    laser surgery  . Cataract extraction w/ intraocular lens  implant, bilateral  ~ 2010  . Skin cancer excision      "all over my face" (07/07/2012)  . 25 gauge pars plana vitrectomy with 20 gauge mvr port for macular hole  07/07/2012    Procedure: 25 GAUGE PARS PLANA VITRECTOMY WITH 20 GAUGE MVR PORT FOR MACULAR HOLE;  Surgeon: Hayden Pedro, MD;  Location: [facility];  Service: Ophthalmology;  Laterality: Left;  . Serum patch  07/07/2012    Procedure: SERUM PATCH;  Surgeon: Hayden Pedro, MD;  Location: [facility];  Service: Ophthalmology;  Laterality: Left;  . Gas insertion  07/07/2012    Procedure: INSERTION OF GAS;  Surgeon: Hayden Pedro, MD;  Location: [facility];  Service: Ophthalmology;  Laterality: Left;  [id number]  . Cardiovascular stress test  2013    treadmill - no evidence ischemia, EF 61%  . Colonoscopy  2014    [facility] WNL no rpt  needed, h/o polyps    Family History  Problem Relation Age of Onset  . Dementia Mother   . Heart failure Father 3  . Cancer Sister     breast  . Diabetes Paternal Uncle   . Diabetes Paternal Aunt   . CAD Brother 66    MI  . Stroke Neg Hx     Social History:  reports that he has quit smoking. His smoking use included Cigarettes. He has a 50 pack-year smoking history. He has never used smokeless tobacco. He reports that he drinks alcohol. He reports that he does not use illicit drugs.  He works at [employer] 20 hours/week.  The patient is  accompanied by his wife.  Allergies:  Allergies  Allergen Reactions  . Sulfa Antibiotics Itching and Rash  Possible allergy to Primaxin, voriconazole, Zoloft, allopurinol, and a chemotherapy medication.  Current Medications: Current Outpatient Prescriptions  Medication Sig Dispense Refill  . acyclovir (ZOVIRAX) 400 MG tablet Take 1 tablet (400 mg total) by mouth 2 (two) times daily. For leukemia 60 tablet 0  . aspirin EC 81 MG tablet Take 81 mg by mouth daily.    Marland Kitchen glucose blood (ACCU-CHEK AVIVA PLUS) test strip Use to check sugar twice daily Dx:E11.9 200 each 3  . hydrochlorothiazide (HYDRODIURIL) 25 MG tablet Take 1 tablet (25 mg total) by mouth daily.    . insulin NPH Human (HUMULIN N) 100 UNIT/ML injection Inject 0.35 mLs (35 Units total) into the skin 2 (two) times daily before a meal. 20 mL 11  . insulin regular (HUMULIN R) 100 units/mL injection Inject 0.1 mLs (10 Units total) into the skin 2 (two) times daily before a meal. 10 mL 11  . levofloxacin (LEVAQUIN) 500 MG tablet Take 1 tablet (500 mg total) by mouth daily. 7 tablet 0  . lisinopril (PRINIVIL,ZESTRIL) 20 MG tablet TAKE ONE TABLET BY MOUTH TWICE DAILY 60 tablet 3  . metFORMIN (GLUCOPHAGE) 1000 MG tablet TAKE ONE TABLET BY MOUTH TWICE DAILY AS DIRECTED 180 tablet 1  . omeprazole (PRILOSEC) 20 MG capsule Take 40 mg by mouth daily.     . pravastatin (PRAVACHOL) 20 MG tablet TAKE ONE TABLET AT BEDTIME 30 tablet 6  . tamsulosin (FLOMAX) 0.4 MG CAPS capsule Take 0.4 mg by mouth daily.    . vitamin B-12 (CYANOCOBALAMIN) 1000 MCG tablet Take 1,000 mcg by mouth daily.    . vitamin E 400 UNIT capsule Take 400 Units by mouth daily.    . fluticasone (FLONASE) 50 MCG/ACT nasal spray Place 2 sprays into both nostrils daily. (Patient not taking: Reported on 02/03/2015) 16 g 3   No current facility-administered medications for this visit.    Review of Systems:  GENERAL:  Fatigue.  Some sweats.  Isolated temperature. PERFORMANCE STATUS  (ECOG):  1-2 HEENT:  No visual changes, runny nose, sore throat, mouth sores or tenderness. Lungs: No shortness of breath.  Slight cough.  No hemoptysis. Cardiac:  No chest pain, palpitations, orthopnea, or PND. GI:  No nausea, vomiting, diarrhea, constipation, melena or hematochezia. GU:  No urgency, frequency, dysuria, or hematuria. Musculoskeletal:  No back pain.  No joint pain.  No muscle tenderness. Extremities:  No pain or swelling. Skin:  No rashes or skin changes. Neuro:  No headache, numbness or weakness, balance or coordination issues. Endocrine:  Diabetes.  Blood sugar spikes.  No thyroid issues, hot flashes or night sweats. Psych:  No mood changes, depression or anxiety. Pain:  No focal pain. Review of  systems:  All other systems reviewed and found to be negative.   Physical Exam: Blood pressure 190/95, pulse 88, temperature 97 F (36.1 C), temperature source Tympanic, resp. rate 16, weight 220 lb 3.8 oz (99.9 kg), SpO2 96 %.  GENERAL:  Well developed, well nourished, sitting comfortably in the exam room in no acute distress. MENTAL STATUS:  Alert and oriented to person, place and time. HEAD:  Pearline Cables hair.  Normocephalic, atraumatic, face symmetric, no Cushingoid features. EYES:  Dark blue eyes.  Pupils equal round and reactive to light and accomodation.  No conjunctivitis or scleral icterus. ENT:  Oropharynx clear without lesion.  Tongue normal. Mucous membranes moist.  RESPIRATORY:  Clear to auscultation without rales, wheezes or rhonchi. CARDIOVASCULAR:  Regular rate and rhythm without murmur, rub or gallop. ABDOMEN:  Soft, non-tender, with active bowel sounds, and no hepatosplenomegaly.  No masses. SKIN:  Scattered small ecchymosis.  No rashes, ulcers or lesions. EXTREMITIES: No edema, no skin discoloration or tenderness.  No palpable cords. LYMPH NODES: No palpable cervical, supraclavicular, axillary or inguinal adenopathy  NEUROLOGICAL: Unremarkable. PSYCH:   Appropriate.  Appointment on 02/03/2015  Component Date Value Ref Range Status  . WBC 02/03/2015 1.8* 3.8 - 10.6 K/uL Final   Comment: [facility] CRITICAL VALUE PROTOCOL RESULT REPEATED AND VERIFIED   . RBC 02/03/2015 2.76* 4.40 - 5.90 MIL/uL Final  . Hemoglobin 02/03/2015 10.0* 13.0 - 18.0 g/dL Final  . HCT 02/03/2015 29.9* 40.0 - 52.0 % Final  . MCV 02/03/2015 108.6* 80.0 - 100.0 fL Final  . MCH 02/03/2015 36.2* 26.0 - 34.0 pg Final  . MCHC 02/03/2015 33.3  32.0 - 36.0 g/dL Final  . RDW 02/03/2015 14.5  11.5 - 14.5 % Final  . Platelets 02/03/2015 165  150 - 440 K/uL Final  . Neutrophils Relative % 02/03/2015 49%   Final  . Neutro Abs 02/03/2015 0.9* 1.4 - 6.5 K/uL Final  . Lymphocytes Relative 02/03/2015 44%   Final  . Lymphs Abs 02/03/2015 0.8* 1.0 - 3.6 K/uL Final  . Monocytes Relative 02/03/2015 3%   Final  . Monocytes Absolute 02/03/2015 0.0* 0.2 - 1.0 K/uL Final  . Eosinophils Relative 02/03/2015 3%   Final  . Eosinophils Absolute 02/03/2015 0.0  0 - 0.7 K/uL Final  . Basophils Relative 02/03/2015 1%   Final  . Basophils Absolute 02/03/2015 0.0  0 - 0.1 K/uL Final    Assessment:  Alex Smith is a 80 y.o. 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  He was diagnosed in 2006.  He received cladribine 11/06-11/05/2005.  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  He has an unclear allergy history.  He may be allergic to Primaxin, voriconazole, Zoloft, allopurinol, and a chemotherapy medication.  Reaction occurred in 06/2005.  He is allergic to sulfa.  He requires washed RBCs.  He has progressive fatigue and declining counts.  Hemoglobin is 10 and ANC is 900.  He is currently on Levaquin for pneumonia.  Plan: 1. Review entire medical history, diagnosis and management of hairy cell leukemia, worrisome finding for possible recurrent disease.  Discuss unclear significance of monoclonal gammopathy.  Discuss increased MCV and possible MDS.  Discuss  indications for treatment with hairy cell leukemia include sytemic symptoms, recurrent infections, hemoglobin < 12, platelets < 100,000, and ANC < 1000. 2. Labs today:  CBC with diff. 3. Schedule bone marrow aspirate and biopsy with interventional radiology:  r/o recurrent hairy cell leukemia.  Assess for myeloma  or MDS changes.  Send for pathology, flow, cytogenetics, FISH. 4. Labs on day of marrow:  B12, folate, TSH, retic, SPEP with immunofixation, free light chain assay. 5. Urine for UPEP and free light chains. 6. Obtain records from 06/2005 admission regarding allergies. 7. Review fever and neutropenia precautions. 8. RTC 10 days after bone marrow to discuss results and direction of therapy.  Lequita Asal, MD  02/03/2015, 8:36 PM

## 2015-02-05 LAB — KAPPA/LAMBDA LIGHT CHAINS
KAPPA FREE LGHT CHN: 47.63 mg/L — AB (ref 3.30–19.40)
Kappa, lambda light chain ratio: 3.12 — ABNORMAL HIGH (ref 0.26–1.65)
Lambda free light chains: 15.28 mg/L (ref 5.71–26.30)

## 2015-02-06 ENCOUNTER — Other Ambulatory Visit: Payer: Self-pay | Admitting: Radiology

## 2015-02-06 ENCOUNTER — Other Ambulatory Visit: Payer: Self-pay | Admitting: Hematology and Oncology

## 2015-02-06 ENCOUNTER — Other Ambulatory Visit: Payer: Self-pay | Admitting: Family Medicine

## 2015-02-06 ENCOUNTER — Encounter: Payer: Self-pay | Admitting: Hematology and Oncology

## 2015-02-06 DIAGNOSIS — C9590 Leukemia, unspecified not having achieved remission: Secondary | ICD-10-CM

## 2015-02-06 LAB — PROTEIN ELECTROPHORESIS, SERUM
A/G Ratio: 0.9 (ref 0.7–1.7)
Albumin ELP: 3 g/dL (ref 2.9–4.4)
Alpha-1-Globulin: 0.3 g/dL (ref 0.0–0.4)
Alpha-2-Globulin: 1 g/dL (ref 0.4–1.0)
Beta Globulin: 0.9 g/dL (ref 0.7–1.3)
Gamma Globulin: 1.2 g/dL (ref 0.4–1.8)
Globulin, Total: 3.5 g/dL (ref 2.2–3.9)
M-Spike, %: 1 g/dL — ABNORMAL HIGH
Total Protein ELP: 6.5 g/dL (ref 6.0–8.5)

## 2015-02-07 ENCOUNTER — Ambulatory Visit
Admission: RE | Admit: 2015-02-07 | Discharge: 2015-02-07 | Disposition: A | Discharge status: Home / Self Care | Payer: Medicare Other | Source: Ambulatory Visit | Attending: Hematology and Oncology | Admitting: Hematology and Oncology

## 2015-02-07 ENCOUNTER — Other Ambulatory Visit: Payer: Self-pay | Admitting: Hematology and Oncology

## 2015-02-07 ENCOUNTER — Encounter: Payer: Self-pay | Admitting: Hematology and Oncology

## 2015-02-07 DIAGNOSIS — R898 Other abnormal findings in specimens from other organs, systems and tissues: Secondary | ICD-10-CM | POA: Diagnosis not present

## 2015-02-07 DIAGNOSIS — D61818 Other pancytopenia: Secondary | ICD-10-CM | POA: Diagnosis not present

## 2015-02-07 DIAGNOSIS — D472 Monoclonal gammopathy: Secondary | ICD-10-CM

## 2015-02-07 DIAGNOSIS — C9140 Hairy cell leukemia not having achieved remission: Secondary | ICD-10-CM | POA: Diagnosis not present

## 2015-02-07 DIAGNOSIS — D539 Nutritional anemia, unspecified: Secondary | ICD-10-CM

## 2015-02-07 DIAGNOSIS — K5900 Constipation, unspecified: Secondary | ICD-10-CM | POA: Diagnosis not present

## 2015-02-07 DIAGNOSIS — C9590 Leukemia, unspecified not having achieved remission: Secondary | ICD-10-CM | POA: Diagnosis not present

## 2015-02-07 DIAGNOSIS — D4989 Neoplasm of unspecified behavior of other specified sites: Secondary | ICD-10-CM | POA: Diagnosis not present

## 2015-02-07 DIAGNOSIS — Z7982 Long term (current) use of aspirin: Secondary | ICD-10-CM | POA: Diagnosis not present

## 2015-02-07 HISTORY — DX: Reserved for inherently not codable concepts without codable children: IMO0001

## 2015-02-07 LAB — FOLATE: Folate: 31 ng/mL (ref >5.9)

## 2015-02-07 LAB — DIFFERENTIAL
Band Neutrophils: 3 % (ref 0–10)
Basophils Absolute: 0 10*3/uL (ref 0–0.1)
Basophils Relative: 0 % (ref 0–1)
Blasts: 0 %
Eosinophils Absolute: 0 10*3/uL (ref 0–0.7)
Eosinophils Relative: 1 % (ref 0–5)
Lymphocytes Relative: 38 % (ref 12–46)
Lymphs Abs: 0.7 10*3/uL — ABNORMAL LOW (ref 1.0–3.6)
Metamyelocytes Relative: 0 %
Monocytes Absolute: 0.1 10*3/uL — ABNORMAL LOW (ref 0.2–1.0)
Monocytes Relative: 1 % — ABNORMAL LOW (ref 3–12)
Myelocytes: 0 %
Neutro Abs: 1.1 10*3/uL — ABNORMAL LOW (ref 1.4–6.5)
Neutrophils Relative %: 57 % (ref 43–77)
Other: 0 %
Promyelocytes Absolute: 0 %
nRBC: 0 /100 WBC

## 2015-02-07 LAB — CBC
HCT: 27.5 % — ABNORMAL LOW (ref 40.0–52.0)
Hemoglobin: 9.2 g/dL — ABNORMAL LOW (ref 13.0–18.0)
MCH: 36.5 pg — AB (ref 26.0–34.0)
MCHC: 33.6 g/dL (ref 32.0–36.0)
MCV: 108.6 fL — AB (ref 80.0–100.0)
PLATELETS: 170 10*3/uL (ref 150–440)
RBC: 2.53 MIL/uL — AB (ref 4.40–5.90)
RDW: 14.2 % (ref 11.5–14.5)
WBC: 1.9 10*3/uL — ABNORMAL LOW (ref 3.8–10.6)

## 2015-02-07 LAB — RETICULOCYTES
RBC.: 2.53 MIL/uL — ABNORMAL LOW (ref 4.40–5.90)
Retic Count, Absolute: 55.7 10*3/uL (ref 19.0–183.0)
Retic Ct Pct: 2.2 % (ref 0.4–3.1)

## 2015-02-07 LAB — TSH: TSH: 1.986 u[IU]/mL (ref 0.350–4.500)

## 2015-02-07 LAB — VITAMIN B12: Vitamin B-12: 772 pg/mL (ref 180–914)

## 2015-02-07 LAB — PROTIME-INR
INR: 0.96
Prothrombin Time: 13 seconds (ref 11.4–15.0)

## 2015-02-07 IMAGING — CT CT BIOPSY
1 of 2 series · 15 of 32 positions shown, 19 images · non-contrast
Comparison: none

CLINICAL DATA: Hairy cell leukemia

[Series 3: osteo bx 2.4 b70s · axial · 0.74mm/px · z∈[-840,-828]mm · 15 of 48 slices shown, 19 images]
[im 3/48  soft-tissue]
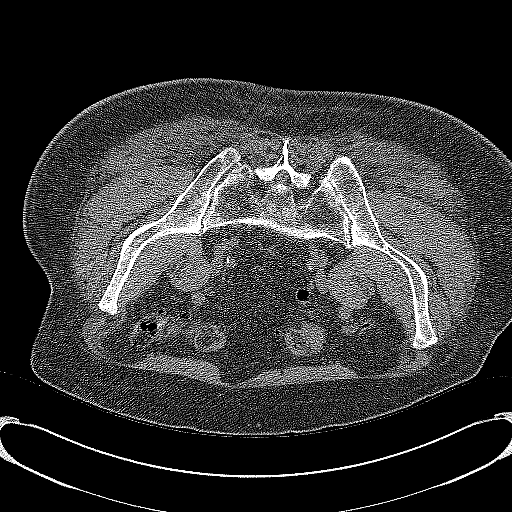
[im 3/48  bone]
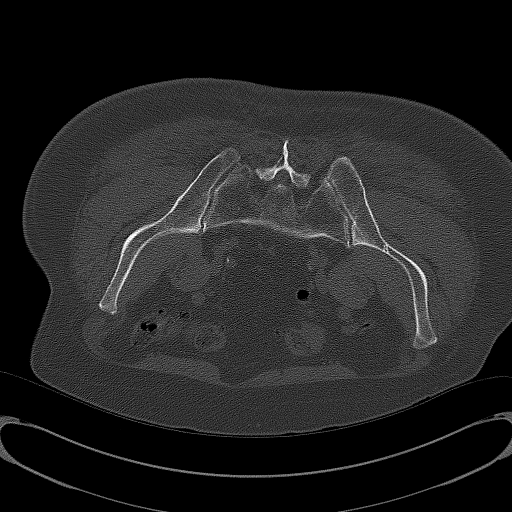
[im 7/48  soft-tissue]
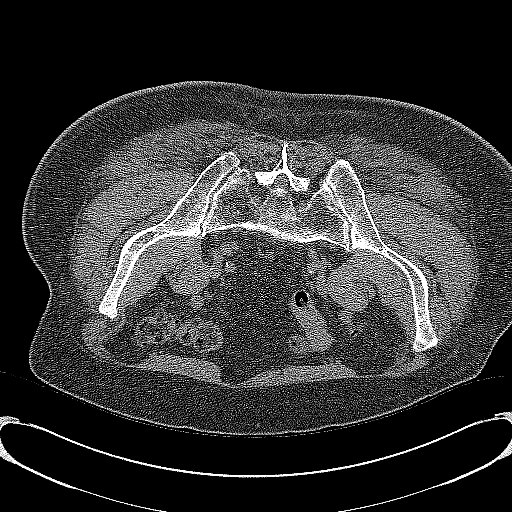
[im 9/48  soft-tissue]
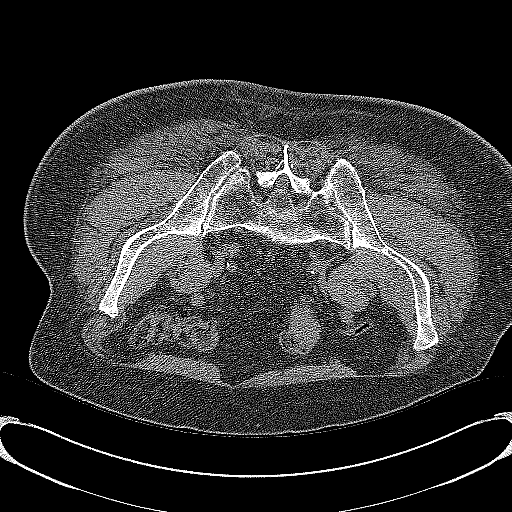
[im 14/48  soft-tissue]
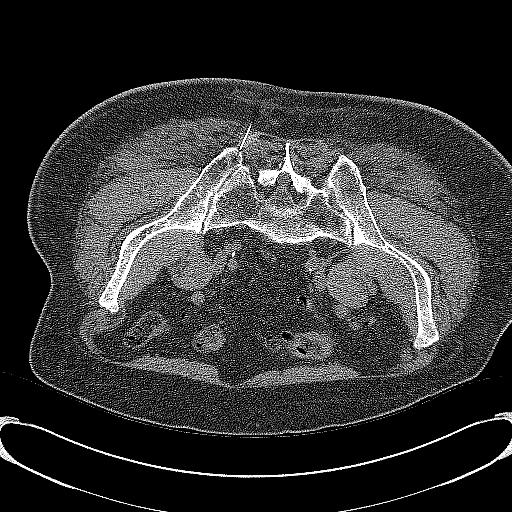
[im 16/48  soft-tissue]
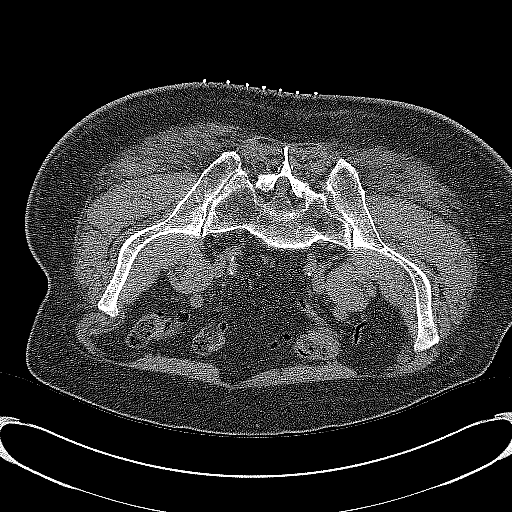
[im 21/48  soft-tissue]
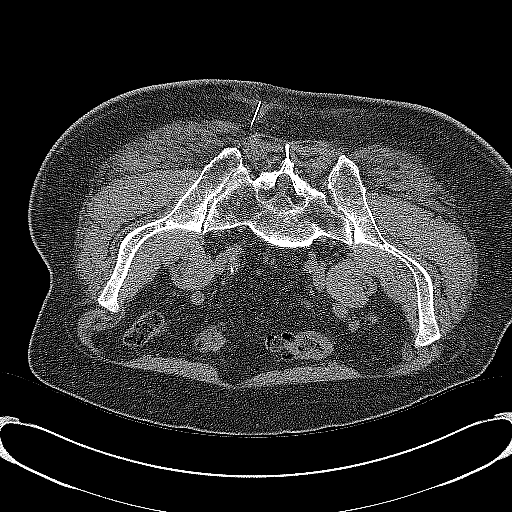
[im 25/48  soft-tissue]
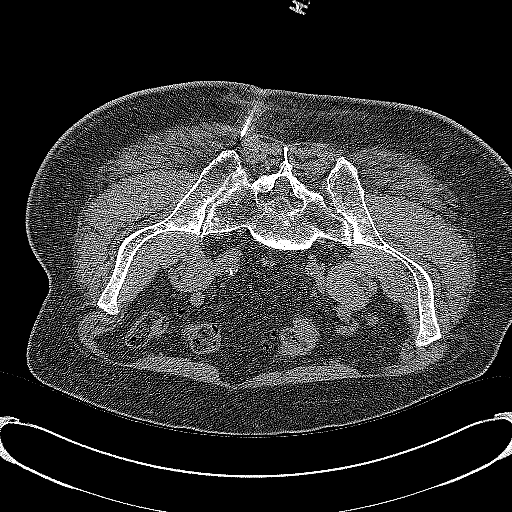
[im 27/48  soft-tissue]
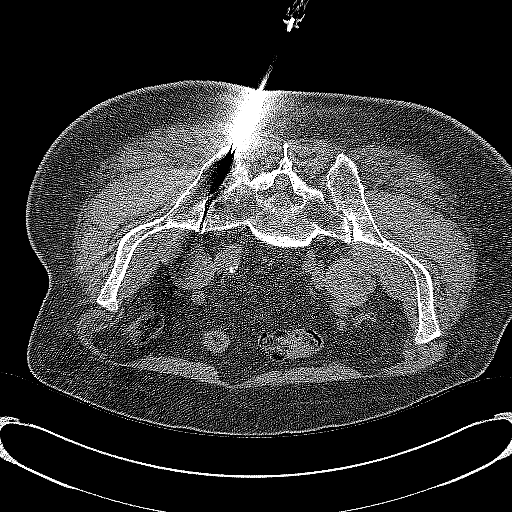
[im 32/48  soft-tissue]
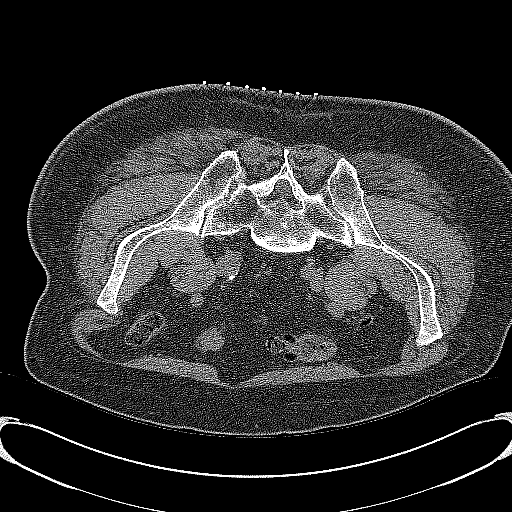
[im 32/48  bone]
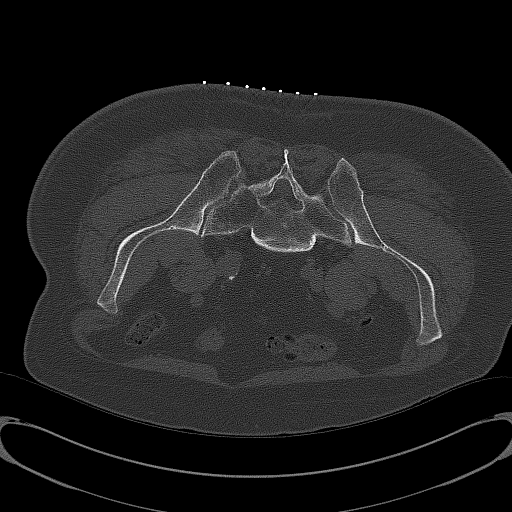
[im 34/48  soft-tissue]
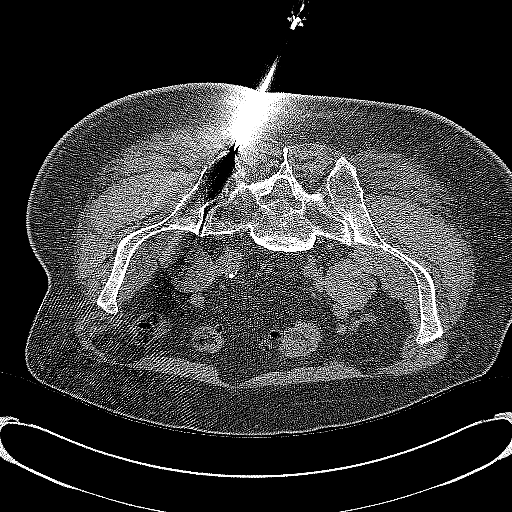
[im 36/48  lung]
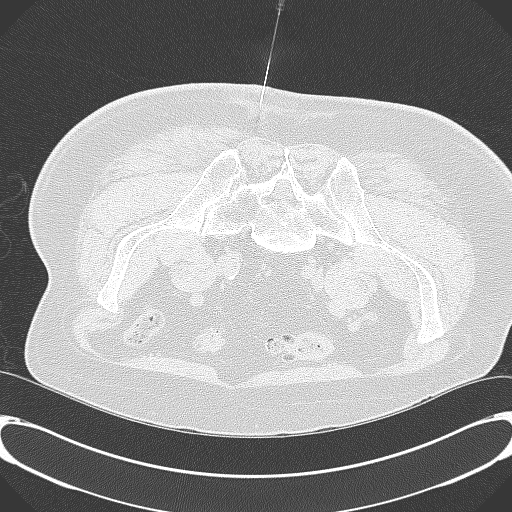
[im 39/48  soft-tissue]
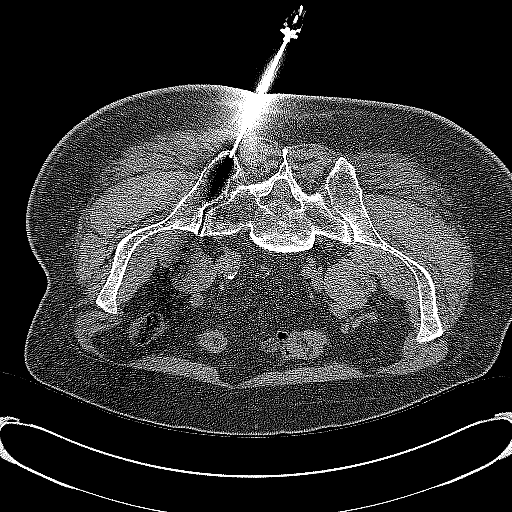
[im 41/48  soft-tissue]
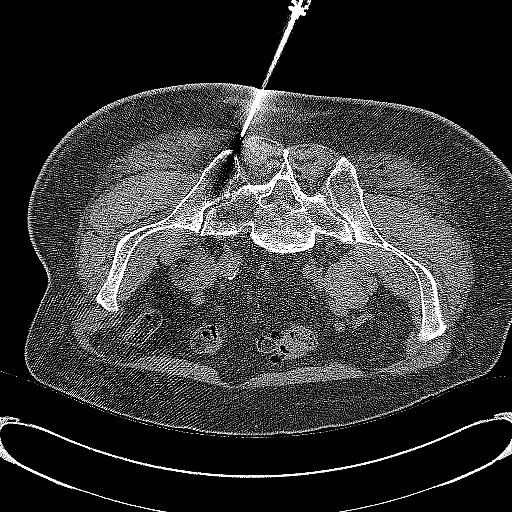
[im 41/48  lung]
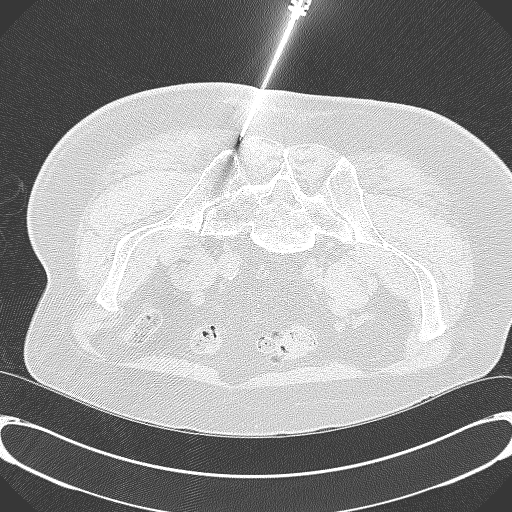
[im 43/48  lung]
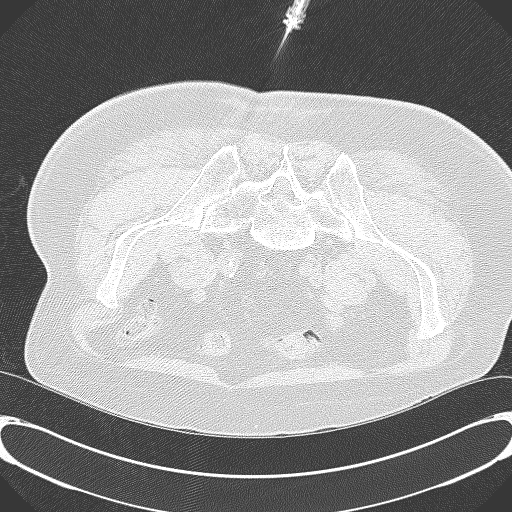
[im 45/48  soft-tissue]
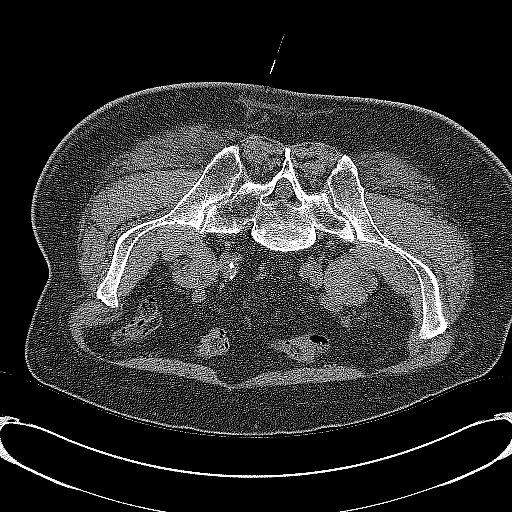
[im 45/48  lung]
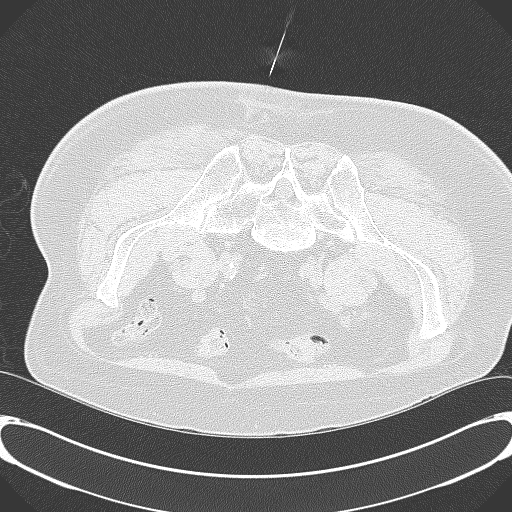

[15 of 32 positions shown; findings below may reference images not displayed]

EXAM:
CT GUIDED NEEDLE ASPIRATE/CORE BIOPSY OF LEFT ILIAC BONE MARROW

ANESTHESIA/SEDATION:
25 mcg IV fentanyl.  Sedation time was approximately 15 minutes

PROCEDURE:
The procedure risks, benefits, and alternatives were explained to
the patient. Questions regarding the procedure were encouraged and
answered. The patient understands and consents to the procedure.

The lower back was prepped with chlorhexidinein a sterile fashion,
and a sterile drape was applied covering the operative field. A
sterile gown and sterile gloves were used for the procedure. Local
anesthesia was provided with 1% Lidocaine.

Utilizing 0.25% Marcaine is a deep periosteal anesthetic and CT
fluoroscopic guidance a biopsy needle was placed into the left iliac
bone. Two initial aspirates were obtained and deemed adequate by the
cytotechnologist.

A core biopsy of the left iliac bone marrow was then obtained and
also deemed adequate. The needle was then removed and hemostasis
obtained at the puncture site. The patient tolerated the procedure
well and was returned to his room in satisfactory condition.

COMPLICATIONS:
None immediate
IMPRESSION: Successful CT-guided bone marrow biopsy of the left iliac bone as
described.

## 2015-02-07 MED ORDER — BUPIVACAINE HCL (PF) 0.25 % IJ SOLN
INTRAMUSCULAR | Status: AC | PRN
  Administered 2015-02-07: 10 mL

## 2015-02-07 MED ORDER — HYDROCODONE-ACETAMINOPHEN 5-325 MG PO TABS: 1.0000 | ORAL_TABLET | ORAL | Status: DC | PRN

## 2015-02-07 MED ORDER — HEPARIN SOD (PORK) LOCK FLUSH 100 UNIT/ML IV SOLN
INTRAVENOUS | Status: DC | PRN
  Administered 2015-02-07: 500 [IU] via INTRAVENOUS

## 2015-02-07 MED ORDER — LIDOCAINE HCL (PF) 1 % IJ SOLN
INTRAMUSCULAR | Status: AC | PRN
  Administered 2015-02-07: 5 mL

## 2015-02-07 MED ORDER — SODIUM CHLORIDE 0.9 % IV SOLN
Freq: Once | INTRAVENOUS | Status: AC
  Administered 2015-02-07: 08:00:00 via INTRAVENOUS

## 2015-02-07 MED ORDER — FENTANYL CITRATE (PF) 100 MCG/2ML IJ SOLN
INTRAMUSCULAR | Status: AC | PRN
  Administered 2015-02-07: 25 ug via INTRAVENOUS

## 2015-02-07 NOTE — Procedures (Signed)
Left iliac bone marrow biopsy and aspirate.  Complications:  None  Blood Loss: none  See dictation in canopy pacs

## 2015-02-07 NOTE — H&P (Signed)
H and P reviewed. No significant change  From H and P from 02-03-15.  Plan for Bone marrow biopsy today, no contraindication for sedation noted.

## 2015-02-08 LAB — KAPPA/LAMBDA LIGHT CHAINS
Kappa free light chain: 54.9 mg/L — ABNORMAL HIGH (ref 3.30–19.40)
Kappa, lambda light chain ratio: 3.14 — ABNORMAL HIGH (ref 0.26–1.65)
Lambda free light chains: 17.47 mg/L (ref 5.71–26.30)

## 2015-02-08 LAB — GLUCOSE, CAPILLARY: Glucose-Capillary: 199 mg/dL — ABNORMAL HIGH (ref 65–99)

## 2015-02-09 LAB — IMMUNOFIXATION ELECTROPHORESIS
IgA: 43 mg/dL — ABNORMAL LOW (ref 61–437)
IgG (Immunoglobin G), Serum: 1386 mg/dL (ref 700–1600)
IgM, Serum: 47 mg/dL (ref 15–143)
Total Protein ELP: 5.9 g/dL — ABNORMAL LOW (ref 6.0–8.5)

## 2015-02-10 ENCOUNTER — Other Ambulatory Visit: Payer: Self-pay | Admitting: *Deleted

## 2015-02-10 ENCOUNTER — Other Ambulatory Visit: Payer: Self-pay

## 2015-02-10 DIAGNOSIS — E785 Hyperlipidemia, unspecified: Secondary | ICD-10-CM | POA: Diagnosis not present

## 2015-02-10 DIAGNOSIS — K219 Gastro-esophageal reflux disease without esophagitis: Secondary | ICD-10-CM | POA: Diagnosis not present

## 2015-02-10 DIAGNOSIS — D472 Monoclonal gammopathy: Secondary | ICD-10-CM | POA: Diagnosis not present

## 2015-02-10 DIAGNOSIS — E119 Type 2 diabetes mellitus without complications: Secondary | ICD-10-CM | POA: Diagnosis not present

## 2015-02-10 DIAGNOSIS — Z794 Long term (current) use of insulin: Secondary | ICD-10-CM | POA: Diagnosis not present

## 2015-02-10 DIAGNOSIS — Z85828 Personal history of other malignant neoplasm of skin: Secondary | ICD-10-CM | POA: Diagnosis not present

## 2015-02-10 DIAGNOSIS — N400 Enlarged prostate without lower urinary tract symptoms: Secondary | ICD-10-CM | POA: Diagnosis not present

## 2015-02-10 DIAGNOSIS — R5383 Other fatigue: Secondary | ICD-10-CM | POA: Diagnosis not present

## 2015-02-10 DIAGNOSIS — R531 Weakness: Secondary | ICD-10-CM | POA: Diagnosis not present

## 2015-02-10 DIAGNOSIS — E538 Deficiency of other specified B group vitamins: Secondary | ICD-10-CM | POA: Diagnosis not present

## 2015-02-10 DIAGNOSIS — Z87891 Personal history of nicotine dependence: Secondary | ICD-10-CM | POA: Diagnosis not present

## 2015-02-10 DIAGNOSIS — Z79899 Other long term (current) drug therapy: Secondary | ICD-10-CM | POA: Diagnosis not present

## 2015-02-10 DIAGNOSIS — I10 Essential (primary) hypertension: Secondary | ICD-10-CM | POA: Diagnosis not present

## 2015-02-10 DIAGNOSIS — C9140 Hairy cell leukemia not having achieved remission: Secondary | ICD-10-CM | POA: Diagnosis not present

## 2015-02-13 ENCOUNTER — Ambulatory Visit: Payer: Medicare Other | Admitting: Family Medicine

## 2015-02-13 LAB — UIFE/LIGHT CHAINS/TP QN, 24-HR UR
% BETA, Urine: 62.7 %
ALPHA 1 URINE: 3.2 %
Albumin, U: 17.6 %
Alpha 2, Urine: 8.8 %
Free Kappa/Lambda Ratio: 15.38 — ABNORMAL HIGH (ref 2.04–10.37)
Free Lambda Lt Chains,Ur: 0.4 mg/L (ref 0.24–6.66)
Free Lt Chn Excr Rate: 6.15 mg/L (ref 1.35–24.19)
GAMMA GLOBULIN URINE: 7.7 %
M-SPIKE %, Urine: 5.5 % — ABNORMAL HIGH
M-Spike, Mg/24 Hr: 3 mg/24 hr — ABNORMAL HIGH
Time: 360118 hours
Total Protein, Urine-Ur/day: 55 mg/24 hr (ref 30.0–150.0)
Total Protein, Urine: 5.5 mg/dL
Volume, Urine: 3000 mL

## 2015-02-15 ENCOUNTER — Ambulatory Visit (INDEPENDENT_AMBULATORY_CARE_PROVIDER_SITE_OTHER): Payer: Medicare Other | Admitting: Family Medicine

## 2015-02-15 ENCOUNTER — Encounter: Payer: Self-pay | Admitting: Family Medicine

## 2015-02-15 VITALS — BP 122/66 | HR 72 | Temp 98.1°F | Wt 218.8 lb

## 2015-02-15 DIAGNOSIS — J189 Pneumonia, unspecified organism: Secondary | ICD-10-CM | POA: Diagnosis not present

## 2015-02-15 DIAGNOSIS — C9140 Hairy cell leukemia not having achieved remission: Secondary | ICD-10-CM

## 2015-02-15 DIAGNOSIS — I10 Essential (primary) hypertension: Secondary | ICD-10-CM | POA: Diagnosis not present

## 2015-02-15 DIAGNOSIS — E119 Type 2 diabetes mellitus without complications: Secondary | ICD-10-CM

## 2015-02-15 HISTORY — DX: Pneumonia, unspecified organism: J18.9

## 2015-02-15 NOTE — Progress Notes (Signed)
BP 122/66 mmHg  Pulse 72  Temp(Src) 98.1 F (36.7 C) (Oral)  Wt 218 lb 12 oz (99.224 kg)  SpO2 96%   CC: 4 mo f/u visit  Subjective:    Patient ID: Alex Smith, male    DOB: 03-Sep-1934, 80 y.o.   MRN: [id number]  HPI: ADONAI SELSOR is a 80 y.o. male presenting on 02/15/2015 for Follow-up   Recently treated for PNA by Allie Bossier - with 1 wk levaquin. Cough has improved. Overall feeling better.  Hairy cell leukemia followed by Dr Penelope Galas at [facility]. Concern for recurrence. May need to restart chemotherapy.  HTN - great control on current regimen.  DM - regularly does check sugars - elevated readings with recent pneumonia, but getting back to normal. Today fasting 89. Compliant with antihyperglycemic regimen which includes: metformin, humulin R and humulin N (recent increase to 35u BID. Denies low sugars or hypoglycemic symptoms.  Denies paresthesias. Last diabetic eye exam 02/2014.  Pneumovax: 2006.  Prevnar: 2015. Lab Results  Component Value Date   HGBA1C 7.5* 10/05/2014   Diabetic Foot Exam - Simple   No data filed       Relevant past medical, surgical, family and social history reviewed and updated as indicated. Interim medical history since our last visit reviewed. Allergies and medications reviewed and updated. Current Outpatient Prescriptions on File Prior to Visit  Medication Sig  . acyclovir (ZOVIRAX) 400 MG tablet Take 1 tablet (400 mg total) by mouth 2 (two) times daily. For leukemia  . aspirin EC 81 MG tablet Take 81 mg by mouth daily.  Marland Kitchen glucose blood (ACCU-CHEK AVIVA PLUS) test strip Use to check sugar twice daily Dx:E11.9  . hydrochlorothiazide (HYDRODIURIL) 25 MG tablet Take 1 tablet (25 mg total) by mouth daily.  . insulin NPH Human (HUMULIN N) 100 UNIT/ML injection Inject 0.35 mLs (35 Units total) into the skin 2 (two) times daily before a meal.  . insulin regular (HUMULIN R) 100 units/mL injection Inject 0.1 mLs (10 Units total) into the skin 2 (two)  times daily before a meal.  . lisinopril (PRINIVIL,ZESTRIL) 20 MG tablet TAKE ONE TABLET BY MOUTH TWICE DAILY  . metFORMIN (GLUCOPHAGE) 1000 MG tablet TAKE ONE TABLET BY MOUTH TWICE DAILY AS DIRECTED  . omeprazole (PRILOSEC) 20 MG capsule Take 40 mg by mouth daily.   . pravastatin (PRAVACHOL) 20 MG tablet TAKE ONE TABLET AT BEDTIME  . tamsulosin (FLOMAX) 0.4 MG CAPS capsule Take 0.4 mg by mouth daily.  . vitamin B-12 (CYANOCOBALAMIN) 1000 MCG tablet Take 1,000 mcg by mouth daily.  . vitamin E 400 UNIT capsule Take 400 Units by mouth daily.   No current facility-administered medications on file prior to visit.    Review of Systems Per HPI unless specifically indicated above     Objective:    BP 122/66 mmHg  Pulse 72  Temp(Src) 98.1 F (36.7 C) (Oral)  Wt 218 lb 12 oz (99.224 kg)  SpO2 96%  Wt Readings from Last 3 Encounters:  02/15/15 218 lb 12 oz (99.224 kg)  02/07/15 220 lb (99.791 kg)  02/03/15 220 lb 3.8 oz (99.9 kg)    Physical Exam  Constitutional: He appears well-developed and well-nourished. No distress.  HENT:  Head: Normocephalic and atraumatic.  Right Ear: External ear normal.  Left Ear: External ear normal.  Nose: Nose normal.  Mouth/Throat: Oropharynx is clear and moist. No oropharyngeal exudate.  Eyes: Conjunctivae and EOM are normal. Pupils are equal, round, and reactive  to light. No scleral icterus.  Neck: Normal range of motion. Neck supple.  Cardiovascular: Normal rate, regular rhythm and intact distal pulses.   Murmur (3/6 SEM at upper chest) heard. Pulmonary/Chest: Effort normal and breath sounds normal. No respiratory distress. He has no wheezes. He has no rales.  Musculoskeletal: He exhibits edema (tr pedal edema).  See HPI for foot exam if done  Lymphadenopathy:    He has no cervical adenopathy.  Skin: Skin is warm and dry. No rash noted.  Psychiatric: He has a normal mood and affect.  Nursing note and vitals reviewed.  Results for orders placed  or performed in visit on 02/10/15  IFE, Urine (with Tot Prot)  Result Value Ref Range   Time 225750 hours   Volume, Urine 3000 mL   Total Protein, Urine 5.5 Not Estab. mg/dL   Total Protein, Urine-Ur/day 55.0 30.0 - 150.0 mg/24 hr   Albumin, U 17.6 %   ALPHA 1 URINE 3.2 %   Alpha 2, Urine 8.8 %   % Beta 62.7 %   GAMMA GLOBULIN URINE 7.7 %   Free Lt Chn Excr Rate 6.15 1.35 - 24.19 mg/L   Free Lambda Lt Chains,Ur 0.40 0.24 - 6.66 mg/L   Free Kappa/Lambda Ratio 15.38 (H) 2.04 - 10.37   Immunofixation Result, Urine Comment    M-Spike, % 5.5 (H) Not Observed %   M-Spike, Mg/24 Hr 3 (H) Not Observed mg/24 hr   Note: Comment       Assessment & Plan:   Problem List Items Addressed This Visit    CAP (community acquired pneumonia)    S/p levaquin 500mg  7d course and sxs largely resolved.       Essential hypertension - Primary    Chronic, stable. Continue current regimen.      Hairy cell leukemia    Concern for recurrence and may need to restart chemo. Has f/u with onc on Friday. Appreciate excellent oncological care of my patient.      Type II diabetes mellitus    Chronic, anticipate improved based on #s. Goal A1c <8%.  Recent elevated readings likely related to treated pneumonia. Will defer A1c today and recheck at medicare wellness visit in 2-3 months.          Follow up plan: Return in about 3 months (around 05/18/2015), or as needed, for medicare wellness.

## 2015-02-15 NOTE — Assessment & Plan Note (Signed)
Chronic, anticipate improved based on #s. Goal A1c <8%.  Recent elevated readings likely related to treated pneumonia. Will defer A1c today and recheck at medicare wellness visit in 2-3 months.

## 2015-02-15 NOTE — Assessment & Plan Note (Addendum)
S/p levaquin 500mg  7d course and sxs largely resolved.

## 2015-02-15 NOTE — Progress Notes (Signed)
Pre visit review using our clinic review tool, if applicable. No additional management support is needed unless otherwise documented below in the visit note. 

## 2015-02-15 NOTE — Assessment & Plan Note (Signed)
Chronic, stable. Continue current regimen. 

## 2015-02-15 NOTE — Assessment & Plan Note (Signed)
Concern for recurrence and may need to restart chemo. Has f/u with onc on Friday. Appreciate excellent oncological care of my patient.

## 2015-02-15 NOTE — Patient Instructions (Signed)
I think you are doing well today. I'm glad pneumonia is improved. Return as needed or in 3 months for medicare wellness visit. We will wait to see what Dr [name] recommends.

## 2015-02-17 ENCOUNTER — Inpatient Hospital Stay: Payer: Medicare Other | Attending: Hematology and Oncology | Admitting: Hematology and Oncology

## 2015-02-17 ENCOUNTER — Encounter: Payer: Self-pay | Admitting: Hematology and Oncology

## 2015-02-17 VITALS — BP 136/78 | HR 91 | Temp 97.3°F | Resp 16 | Wt 217.6 lb

## 2015-02-17 DIAGNOSIS — K5900 Constipation, unspecified: Secondary | ICD-10-CM | POA: Diagnosis not present

## 2015-02-17 DIAGNOSIS — Z794 Long term (current) use of insulin: Secondary | ICD-10-CM | POA: Diagnosis not present

## 2015-02-17 DIAGNOSIS — N400 Enlarged prostate without lower urinary tract symptoms: Secondary | ICD-10-CM

## 2015-02-17 DIAGNOSIS — I10 Essential (primary) hypertension: Secondary | ICD-10-CM | POA: Diagnosis not present

## 2015-02-17 DIAGNOSIS — E785 Hyperlipidemia, unspecified: Secondary | ICD-10-CM | POA: Diagnosis not present

## 2015-02-17 DIAGNOSIS — D61818 Other pancytopenia: Secondary | ICD-10-CM | POA: Diagnosis not present

## 2015-02-17 DIAGNOSIS — D539 Nutritional anemia, unspecified: Secondary | ICD-10-CM

## 2015-02-17 DIAGNOSIS — D649 Anemia, unspecified: Secondary | ICD-10-CM | POA: Diagnosis not present

## 2015-02-17 DIAGNOSIS — K219 Gastro-esophageal reflux disease without esophagitis: Secondary | ICD-10-CM | POA: Insufficient documentation

## 2015-02-17 DIAGNOSIS — Z79899 Other long term (current) drug therapy: Secondary | ICD-10-CM | POA: Diagnosis not present

## 2015-02-17 DIAGNOSIS — R531 Weakness: Secondary | ICD-10-CM | POA: Diagnosis not present

## 2015-02-17 DIAGNOSIS — R0602 Shortness of breath: Secondary | ICD-10-CM | POA: Diagnosis not present

## 2015-02-17 DIAGNOSIS — D472 Monoclonal gammopathy: Secondary | ICD-10-CM

## 2015-02-17 DIAGNOSIS — Z8701 Personal history of pneumonia (recurrent): Secondary | ICD-10-CM | POA: Insufficient documentation

## 2015-02-17 DIAGNOSIS — C9140 Hairy cell leukemia not having achieved remission: Secondary | ICD-10-CM | POA: Diagnosis not present

## 2015-02-17 DIAGNOSIS — Z87891 Personal history of nicotine dependence: Secondary | ICD-10-CM | POA: Diagnosis not present

## 2015-02-17 DIAGNOSIS — Z7982 Long term (current) use of aspirin: Secondary | ICD-10-CM | POA: Diagnosis not present

## 2015-02-17 DIAGNOSIS — E538 Deficiency of other specified B group vitamins: Secondary | ICD-10-CM | POA: Diagnosis not present

## 2015-02-17 DIAGNOSIS — R5383 Other fatigue: Secondary | ICD-10-CM | POA: Diagnosis not present

## 2015-02-17 DIAGNOSIS — Z85828 Personal history of other malignant neoplasm of skin: Secondary | ICD-10-CM | POA: Insufficient documentation

## 2015-02-17 DIAGNOSIS — K5790 Diverticulosis of intestine, part unspecified, without perforation or abscess without bleeding: Secondary | ICD-10-CM | POA: Diagnosis not present

## 2015-02-17 DIAGNOSIS — E119 Type 2 diabetes mellitus without complications: Secondary | ICD-10-CM | POA: Insufficient documentation

## 2015-02-20 NOTE — Progress Notes (Addendum)
[facility] day:  02/17/2015  Chief Complaint: Alex Smith is a 80 y.o. male with a history of hairy cell leukemia who is seen for review of interval bone marrow and discussion regarding direction of therapy.  HPI:  The patient was last seen in the medical oncology clinic on 02/03/2015.  At that time, he was seen for reassessment.  He had a general decline in health with fatigue and weakness over the past few weeks. He was recently diagnosed with pneumonia. He was started on Levaquin. He has had sweats since and an isolated temperature 100.4 on one day. Symptomatically, he was improving.      Counts included a hemoglobin of 10 and an ANC of 900.  RBCs were macrocytic suggestive of a possible myelodysplastic syndrome.  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  Labs on 02/07/2015 included a normal B12, folate, and TSH.  SPEP revealed 1 gm/dL in the gamma region.  Kappa free light chains were 54.9, lambda free light chains 17.47, and a kappa/lambda ratio of 3.14 (.26-1.65).  24 hour urine revealed a 6.15 mg/L free light chain excretion rate with free light chains of 0.40 mg/L and a kappa/lambda ratio of 15.38 (2.04-10.37).  M-spike was 3 mg/24 hours.  He underwent bone marrow aspirate and biopsy on 02/07/2015.  Pathology revealed a extensive marrow involvement by recurrent hairy cell leukemia (approximately 80% of cells in the core). There was a monoclonal plasma cell infiltrate (approximately 10%), compatible with a plasma cell neoplasm. Marrow was hypercellular for age (40-50%) with markedly diminished residual trilineage hematopoiesis and mild multilineage dyspoiesis. There was diffuse mild to focally moderate increase in reticulin. Storage iron was present.   Peripheral smear revealed leukopenia (WBC 1900) with 2% atypical lymphoid cells compatible with hairy cell leukemia. There was moderate anemia with  anisopoikilocytosis. 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  FISH studies for MDS were negative.  Cytogenetics were normal (46, XY).  Symptomatically, he feels about the same.  He has completed his antibiotics.  Past Medical History  Diagnosis Date  . Hypertension   . GERD (gastroesophageal reflux disease)   . HLD (hyperlipidemia)   . History of pneumonia 2000's    "once" (07/07/2012)  . Type II diabetes mellitus   . Hairy cell leukemia 2006    followed by Dr. Cynda Acres  . Basal cell carcinoma of face   . Diverticulosis   . Colon polyps   . BPH (benign prostatic hypertrophy)     followed by urology, discharged (Dr. Bernardo Heater)  . B12 deficiency   . History of shingles   . Shortness of breath dyspnea   . Pneumonia     Past Surgical History  Procedure Laterality Date  . Eye surgery Left 06/2012    laser surgery  . Cataract extraction w/ intraocular lens  implant, bilateral  ~ 2010  . Skin cancer excision      "all over my face" (07/07/2012)  . 25 gauge pars plana vitrectomy with 20 gauge mvr port for macular hole  07/07/2012    Procedure: 25 GAUGE PARS PLANA VITRECTOMY WITH 20 GAUGE MVR PORT FOR MACULAR HOLE;  Surgeon: Hayden Pedro, MD;  Location: [facility];  Service: Ophthalmology;  Laterality: Left;  . Serum patch  07/07/2012    Procedure: SERUM PATCH;  Surgeon: Hayden Pedro, MD;  Location: [place];  Service: Ophthalmology;  Laterality: Left;  . Gas insertion  07/07/2012    Procedure: INSERTION OF GAS;  Surgeon: Hayden Pedro, MD;  Location: [place];  Service: Ophthalmology;  Laterality: Left;  [id number]  . Cardiovascular stress test  2013    treadmill - no evidence ischemia, EF 61%  . Colonoscopy  2014    [facility] WNL no rpt needed, h/o polyps    Family History  Problem Relation Age of Onset  . Dementia Mother   . Heart failure Father 56  . Cancer Sister     breast  . Diabetes Paternal Uncle   . Diabetes  Paternal Aunt   . CAD Brother 75    MI  . Stroke Neg Hx     Social History:  reports that he quit smoking about 46 years ago. His smoking use included Cigarettes. He has a 50 pack-year smoking history. He has never used smokeless tobacco. He reports that he drinks alcohol. He reports that he does not use illicit drugs.  He works at [employer] 20 hours/week.  The patient is accompanied by his wife.  Allergies:  Allergies  Allergen Reactions  . Rituximab Rash    Chest tightness  . Blood-Group Specific Substance Other (See Comments)    Had a post transfusion reaction of red blood cells; NOW REQUIRES WASHED BLOOD CELLS  . Primaxin [Imipenem] Other (See Comments)    Possible allergy  . Voriconazole Other (See Comments)  . Sulfa Antibiotics Itching and Rash  Possible allergy to Primaxin, voriconazole, Zoloft, allopurinol, and a chemotherapy medication.  Current Medications: Current Outpatient Prescriptions  Medication Sig Dispense Refill  . acyclovir (ZOVIRAX) 400 MG tablet Take 1 tablet (400 mg total) by mouth 2 (two) times daily. For leukemia 60 tablet 0  . aspirin EC 81 MG tablet Take 81 mg by mouth daily.    Marland Kitchen glucose blood (ACCU-CHEK AVIVA PLUS) test strip Use to check sugar twice daily Dx:E11.9 200 each 3  . hydrochlorothiazide (HYDRODIURIL) 25 MG tablet Take 1 tablet (25 mg total) by mouth daily.    . insulin NPH Human (HUMULIN N) 100 UNIT/ML injection Inject 0.35 mLs (35 Units total) into the skin 2 (two) times daily before a meal. 20 mL 11  . insulin regular (HUMULIN R) 100 units/mL injection Inject 0.1 mLs (10 Units total) into the skin 2 (two) times daily before a meal. 10 mL 11  . lisinopril (PRINIVIL,ZESTRIL) 20 MG tablet TAKE ONE TABLET BY MOUTH TWICE DAILY 60 tablet 6  . metFORMIN (GLUCOPHAGE) 1000 MG tablet TAKE ONE TABLET BY MOUTH TWICE DAILY AS DIRECTED 180 tablet 3  . omeprazole (PRILOSEC) 20 MG capsule Take 40 mg by mouth daily.     . pravastatin (PRAVACHOL) 20 MG  tablet TAKE ONE TABLET AT BEDTIME 30 tablet 6  . tamsulosin (FLOMAX) 0.4 MG CAPS capsule Take 0.4 mg by mouth daily.    . vitamin B-12 (CYANOCOBALAMIN) 1000 MCG tablet Take 1,000 mcg by mouth daily.    . vitamin E 400 UNIT capsule Take 400 Units by mouth daily.     No current facility-administered medications for this visit.    Review of Systems:  GENERAL:  Fatigue.  Some sweats. No fever. PERFORMANCE STATUS (ECOG):  1-2 HEENT:  No visual changes, runny nose, sore throat. Pulmonary:  No shortness of breath.  No cough.  No hemoptysis. Cardiac:  No chest pain, palpitations, orthopnea, or PND. GI:  No nausea, vomiting, diarrhea, constipation, melena or hematochezia. GU:  No  urgency, frequency, dysuria, or hematuria. Musculoskeletal:  No back pain.  No joint pain.  No muscle tenderness. Extremities:  No pain or swelling. Skin:  No rashes or skin changes. Neuro:  No headache, numbness or weakness, balance or coordination issues. Endocrine:  Diabetes.  Blood sugar spikes.  No thyroid issues, hot flashes or night sweats. Psych:  No mood changes, depression or anxiety. Pain:  No focal pain. Review of systems:  All other systems reviewed and found to be negative.   Physical Exam: Blood pressure 136/78, pulse 91, temperature 97.3 F (36.3 C), temperature source Tympanic, resp. rate 16, weight 217 lb 9.5 oz (98.7 kg).  GENERAL:  Well developed, well nourished, sitting comfortably in the exam room in no acute distress. MENTAL STATUS:  Alert and oriented to person, place and time. HEAD:  Pearline Cables hair.  Normocephalic, atraumatic, face symmetric, no Cushingoid features. EYES:  Dark blue eyes.  No conjunctivitis or scleral icterus. BACK:  Posterior iliac crest unremarkable with tiny eschar and no ecchymosis or erythema. SKIN:  Scattered small ecchymosis.  No rashes, ulcers or lesions. EXTREMITIES: No edema, no skin discoloration or tenderness.   NEUROLOGICAL: Unremarkable. PSYCH:  Appropriate.  No  visits with results within 3 Day(s) from this visit. Latest known visit with results is:  Orders Only on 02/10/2015  Component Date Value Ref Range Status  . Time 02/10/2015 944967   Final  . Volume, Urine 02/10/2015 3000   Final  . Total Protein, Urine 02/10/2015 5.5  Not Estab. mg/dL Final  . Total Protein, Urine-Ur/day 02/10/2015 55.0  30.0 - 150.0 mg/24 hr Final  . Albumin, U 02/10/2015 17.6   Final  . ALPHA 1 URINE 02/10/2015 3.2   Final  . Alpha 2, Urine 02/10/2015 8.8   Final  . % Beta 02/10/2015 62.7   Final  . GAMMA GLOBULIN URINE 02/10/2015 7.7   Final  . Free Lt Chn Excr Rate 02/10/2015 6.15  1.35 - 24.19 mg/L Corrected   **Results verified by repeat testing**  . Free Lambda Lt Chains,Ur 02/10/2015 0.40  0.24 - 6.66 mg/L Corrected   **Results verified by repeat testing**  . Free Kappa/Lambda Ratio 02/10/2015 15.38* 2.04 - 10.37 Corrected   Comment: (NOTE) Performed At: [facility] [place], [place] [id number] Lindon Romp MD ZL:[id number]   . Immunofixation Result, Urine 02/10/2015 Comment   Corrected   Comment: (NOTE) Immunofixation shows IgG monoclonal protein with kappa light chain specificity.   Marland Kitchen M-Spike, % 02/10/2015 5.5* Not Observed % Corrected  . M-Spike, Mg/24 Hr 02/10/2015 3* Not Observed mg/24 hr Corrected  . Note: 02/10/2015 Comment   Corrected   Comment: (NOTE) Protein electrophoresis scan will follow via computer, mail, or courier delivery.     Assessment:  Alex Smith is a 80 y.o. 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  He was diagnosed in 2006.  He received cladribine 11/06-11/05/2005.  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  He has an unclear allergy history.  He may be allergic to Primaxin, voriconazole, Zoloft, allopurinol, and a chemotherapy medication.  Reaction occurred in 06/2005.  He is allergic to sulfa.  He requires washed RBCs.  Bone marrow aspirate and biopsy on  02/07/2015 revealed a extensive marrow involvement by recurrent hairy cell leukemia (approximately 80% of cells in the core). There was a monoclonal plasma cell infiltrate (approximately 10%), compatible with a plasma cell neoplasm. Marrow was hypercellular (40-50%) with markedly diminished residual trilineage hematopoiesis  and mild multilineage dyspoiesis.  Peripheral smear revealed leukopenia (WBC 1900) with 2% atypical lymphoid cells compatible with hairy cell leukemia. There was moderate anemia with anisopoikilocytosis. 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  FISH studies for MDS were negative.  Cytogenetics were normal (46, XY).  He has progressive fatigue and declining counts.  He completed a course of Levaquin for pneumonia.  Plan: 1. Review bone marrow.  Discuss indications for treatment with hairy cell leukemia:  sytemic symptoms, recurrent infections, hemoglobin < 12, platelets < 100,000, and ANC < 1000. 2. Discuss reinstitution of therapy:  Cladribine +/- Rituxan.  Side effects of therapy reviewed.  Discussed hepatitis B testing (surface antigen and core antibody) secondary to potential of Rituxan induced reactivation of hepatitis B. 3. Phone follow-up with pathology to review marrow (CD20 testing) 4. Preauth cladribine +/- Rituxan. 5. Discuss port-a-cath placement. 6. Labs for 07/05:  CBC with diff, PT/INR, hepatitis B core antibody, hepatitis B surface antigen, hepatitis C antibody. 7. RTC for MD assessment, labs (CBC with diff, CMP, Mg), and initiation of Cladribine +/- Rituxan.  Lequita Asal, MD  02/03/2015, 8:36 PM

## 2015-02-21 ENCOUNTER — Telehealth: Payer: Self-pay

## 2015-02-21 ENCOUNTER — Inpatient Hospital Stay: Payer: Medicare Other

## 2015-02-21 DIAGNOSIS — C9140 Hairy cell leukemia not having achieved remission: Secondary | ICD-10-CM

## 2015-02-21 DIAGNOSIS — K5790 Diverticulosis of intestine, part unspecified, without perforation or abscess without bleeding: Secondary | ICD-10-CM | POA: Diagnosis not present

## 2015-02-21 DIAGNOSIS — E785 Hyperlipidemia, unspecified: Secondary | ICD-10-CM | POA: Diagnosis not present

## 2015-02-21 DIAGNOSIS — Z8701 Personal history of pneumonia (recurrent): Secondary | ICD-10-CM | POA: Diagnosis not present

## 2015-02-21 DIAGNOSIS — E119 Type 2 diabetes mellitus without complications: Secondary | ICD-10-CM | POA: Diagnosis not present

## 2015-02-21 DIAGNOSIS — D61818 Other pancytopenia: Secondary | ICD-10-CM | POA: Diagnosis not present

## 2015-02-21 DIAGNOSIS — Z794 Long term (current) use of insulin: Secondary | ICD-10-CM | POA: Diagnosis not present

## 2015-02-21 DIAGNOSIS — Z79899 Other long term (current) drug therapy: Secondary | ICD-10-CM | POA: Diagnosis not present

## 2015-02-21 DIAGNOSIS — R531 Weakness: Secondary | ICD-10-CM | POA: Diagnosis not present

## 2015-02-21 DIAGNOSIS — Z87891 Personal history of nicotine dependence: Secondary | ICD-10-CM | POA: Diagnosis not present

## 2015-02-21 DIAGNOSIS — N400 Enlarged prostate without lower urinary tract symptoms: Secondary | ICD-10-CM | POA: Diagnosis not present

## 2015-02-21 DIAGNOSIS — D649 Anemia, unspecified: Secondary | ICD-10-CM | POA: Diagnosis not present

## 2015-02-21 DIAGNOSIS — I10 Essential (primary) hypertension: Secondary | ICD-10-CM | POA: Diagnosis not present

## 2015-02-21 DIAGNOSIS — R0602 Shortness of breath: Secondary | ICD-10-CM | POA: Diagnosis not present

## 2015-02-21 DIAGNOSIS — K219 Gastro-esophageal reflux disease without esophagitis: Secondary | ICD-10-CM | POA: Diagnosis not present

## 2015-02-21 DIAGNOSIS — R5383 Other fatigue: Secondary | ICD-10-CM | POA: Diagnosis not present

## 2015-02-21 DIAGNOSIS — D472 Monoclonal gammopathy: Secondary | ICD-10-CM

## 2015-02-21 DIAGNOSIS — Z7982 Long term (current) use of aspirin: Secondary | ICD-10-CM | POA: Diagnosis not present

## 2015-02-21 DIAGNOSIS — E538 Deficiency of other specified B group vitamins: Secondary | ICD-10-CM | POA: Diagnosis not present

## 2015-02-21 DIAGNOSIS — Z85828 Personal history of other malignant neoplasm of skin: Secondary | ICD-10-CM | POA: Diagnosis not present

## 2015-02-21 DIAGNOSIS — K5900 Constipation, unspecified: Secondary | ICD-10-CM | POA: Diagnosis not present

## 2015-02-21 LAB — CBC WITH DIFFERENTIAL/PLATELET
Basophils Absolute: 0 10*3/uL (ref 0–0.1)
Basophils Relative: 1 %
Eosinophils Absolute: 0.1 10*3/uL (ref 0–0.7)
Eosinophils Relative: 4 %
HCT: 31.7 % — ABNORMAL LOW (ref 40.0–52.0)
Hemoglobin: 10.6 g/dL — ABNORMAL LOW (ref 13.0–18.0)
Lymphocytes Relative: 44 %
Lymphs Abs: 1 10*3/uL (ref 1.0–3.6)
MCH: 36.2 pg — ABNORMAL HIGH (ref 26.0–34.0)
MCHC: 33.3 g/dL (ref 32.0–36.0)
MCV: 108.6 fL — ABNORMAL HIGH (ref 80.0–100.0)
Monocytes Absolute: 0 10*3/uL — ABNORMAL LOW (ref 0.2–1.0)
Monocytes Relative: 2 %
Neutro Abs: 1.2 10*3/uL — ABNORMAL LOW (ref 1.4–6.5)
Neutrophils Relative %: 49 %
Platelets: 174 10*3/uL (ref 150–440)
RBC: 2.92 MIL/uL — ABNORMAL LOW (ref 4.40–5.90)
RDW: 15.2 % — ABNORMAL HIGH (ref 11.5–14.5)
WBC: 2.3 10*3/uL — ABNORMAL LOW (ref 3.8–10.6)

## 2015-02-21 LAB — PROTIME-INR
INR: 0.9
Prothrombin Time: 12.4 seconds (ref 11.4–15.0)

## 2015-02-21 NOTE — Telephone Encounter (Signed)
Left voicemail with pt to call us back with his questions; he had called Charlesetta Ivory with questions; no answer so left voicemail

## 2015-02-22 LAB — HEPATITIS B CORE ANTIBODY, TOTAL: Hep B Core Total Ab: NEGATIVE

## 2015-02-22 LAB — HEPATITIS C ANTIBODY: HCV Ab: <0.1 s/co ratio (ref 0.0–0.9)

## 2015-02-22 LAB — HEPATITIS B SURFACE ANTIGEN: Hepatitis B Surface Ag: NEGATIVE

## 2015-02-23 ENCOUNTER — Encounter: Payer: Self-pay | Admitting: *Deleted

## 2015-02-23 ENCOUNTER — Encounter
Admission: RE | Disposition: A | Discharge status: Home / Self Care | Payer: Self-pay | Source: Ambulatory Visit | Attending: Vascular Surgery

## 2015-02-23 ENCOUNTER — Ambulatory Visit
Admission: RE | Admit: 2015-02-23 | Discharge: 2015-02-23 | Disposition: A | Discharge status: Home / Self Care | Payer: Medicare Other | Source: Ambulatory Visit | Attending: Vascular Surgery | Admitting: Vascular Surgery

## 2015-02-23 DIAGNOSIS — C9140 Hairy cell leukemia not having achieved remission: Secondary | ICD-10-CM | POA: Diagnosis not present

## 2015-02-23 DIAGNOSIS — Z8701 Personal history of pneumonia (recurrent): Secondary | ICD-10-CM | POA: Diagnosis not present

## 2015-02-23 DIAGNOSIS — N400 Enlarged prostate without lower urinary tract symptoms: Secondary | ICD-10-CM | POA: Insufficient documentation

## 2015-02-23 DIAGNOSIS — K5790 Diverticulosis of intestine, part unspecified, without perforation or abscess without bleeding: Secondary | ICD-10-CM | POA: Diagnosis not present

## 2015-02-23 DIAGNOSIS — E119 Type 2 diabetes mellitus without complications: Secondary | ICD-10-CM | POA: Diagnosis not present

## 2015-02-23 DIAGNOSIS — E785 Hyperlipidemia, unspecified: Secondary | ICD-10-CM | POA: Diagnosis not present

## 2015-02-23 DIAGNOSIS — Z79899 Other long term (current) drug therapy: Secondary | ICD-10-CM | POA: Insufficient documentation

## 2015-02-23 DIAGNOSIS — Z86010 Personal history of colonic polyps: Secondary | ICD-10-CM | POA: Insufficient documentation

## 2015-02-23 DIAGNOSIS — Z794 Long term (current) use of insulin: Secondary | ICD-10-CM | POA: Insufficient documentation

## 2015-02-23 DIAGNOSIS — I10 Essential (primary) hypertension: Secondary | ICD-10-CM | POA: Diagnosis not present

## 2015-02-23 DIAGNOSIS — Z7982 Long term (current) use of aspirin: Secondary | ICD-10-CM | POA: Diagnosis not present

## 2015-02-23 DIAGNOSIS — K219 Gastro-esophageal reflux disease without esophagitis: Secondary | ICD-10-CM | POA: Insufficient documentation

## 2015-02-23 HISTORY — PX: PERIPHERAL VASCULAR CATHETERIZATION: SHX172C

## 2015-02-23 LAB — GLUCOSE, CAPILLARY: GLUCOSE-CAPILLARY: 187 mg/dL — AB (ref 65–99)

## 2015-02-23 SURGERY — PORTA CATH INSERTION
Anesthesia: Moderate Sedation

## 2015-02-23 MED ORDER — HEPARIN (PORCINE) IN NACL 2-0.9 UNIT/ML-% IJ SOLN
INTRAMUSCULAR | Status: AC
  Filled 2015-02-23: qty 500

## 2015-02-23 MED ORDER — HYDROMORPHONE HCL 1 MG/ML IJ SOLN: 1.0000 mg | Freq: Once | INTRAMUSCULAR | Status: DC | PRN

## 2015-02-23 MED ORDER — ATROPINE SULFATE 1 MG/ML IJ SOLN: 0.5000 mg | INTRAMUSCULAR | Status: DC | PRN

## 2015-02-23 MED ORDER — FENTANYL CITRATE (PF) 100 MCG/2ML IJ SOLN
INTRAMUSCULAR | Status: AC
  Filled 2015-02-23: qty 2

## 2015-02-23 MED ORDER — CEFAZOLIN SODIUM 1-5 GM-% IV SOLN
INTRAVENOUS | Status: AC
  Filled 2015-02-23: qty 50

## 2015-02-23 MED ORDER — LIDOCAINE-EPINEPHRINE (PF) 1 %-1:200000 IJ SOLN
INTRAMUSCULAR | Status: AC
  Filled 2015-02-23: qty 30

## 2015-02-23 MED ORDER — FENTANYL CITRATE (PF) 100 MCG/2ML IJ SOLN
INTRAMUSCULAR | Status: DC | PRN
  Administered 2015-02-23 (×2): 50 ug via INTRAVENOUS

## 2015-02-23 MED ORDER — CEFAZOLIN SODIUM 1-5 GM-% IV SOLN
1.0000 g | Freq: Once | INTRAVENOUS | Status: AC
  Administered 2015-02-23: 1 g via INTRAVENOUS

## 2015-02-23 MED ORDER — ONDANSETRON HCL 4 MG/2ML IJ SOLN: 4.0000 mg | INTRAMUSCULAR | Status: DC | PRN

## 2015-02-23 MED ORDER — SODIUM CHLORIDE 0.9 % IR SOLN
80.0000 mg | Freq: Once | Status: DC
  Filled 2015-02-23: qty 2

## 2015-02-23 MED ORDER — LIDOCAINE-EPINEPHRINE (PF) 1 %-1:200000 IJ SOLN
INTRAMUSCULAR | Status: DC | PRN
Start: 2015-02-23 — End: 2015-02-23
  Administered 2015-02-23: 10 mL via INTRADERMAL

## 2015-02-23 MED ORDER — SODIUM CHLORIDE 0.9 % IV SOLN
INTRAVENOUS | Status: DC
  Administered 2015-02-23: 09:00:00 via INTRAVENOUS

## 2015-02-23 MED ORDER — MIDAZOLAM HCL 2 MG/2ML IJ SOLN
INTRAMUSCULAR | Status: DC | PRN
  Administered 2015-02-23: 1 mg via INTRAVENOUS
  Administered 2015-02-23: 2 mg via INTRAVENOUS

## 2015-02-23 MED ORDER — MIDAZOLAM HCL 5 MG/5ML IJ SOLN
INTRAMUSCULAR | Status: AC
  Filled 2015-02-23: qty 5

## 2015-02-23 SURGICAL SUPPLY — 13 items
CANISTER SUCT 3000ML PPV (MISCELLANEOUS) ×3 IMPLANT
DERMABOND ADVANCED (GAUZE/BANDAGES/DRESSINGS) ×2
DERMABOND ADVANCED .7 DNX12 (GAUZE/BANDAGES/DRESSINGS) ×1 IMPLANT
ELECT CAUTERY BLADE 6.4 (BLADE) ×3 IMPLANT
HANDLE YANKAUER SUCT BULB TIP (MISCELLANEOUS) ×3 IMPLANT
PACK ANGIOGRAPHY (CUSTOM PROCEDURE TRAY) ×3 IMPLANT
PAD GROUND ADULT SPLIT (MISCELLANEOUS) ×3 IMPLANT
PORTACATH POWER 8F (Port) ×3 IMPLANT
PREP CHG 10.5 TEAL (MISCELLANEOUS) ×3 IMPLANT
SUT MNCRL AB 4-0 PS2 18 (SUTURE) ×3 IMPLANT
SUT PROLENE 0 CT 1 30 (SUTURE) ×6 IMPLANT
SUTURE VIC 3-0 (SUTURE) ×6 IMPLANT
TOWEL OR 17X26 4PK STRL BLUE (TOWEL DISPOSABLE) ×3 IMPLANT

## 2015-02-23 NOTE — H&P (Signed)
 [facility] History & Physical Update  The patient was interviewed and re-examined.  The patient's previous History and Physical has been reviewed and is unchanged.  There is no change in the plan of care. We plan to proceed with the scheduled procedure as patient needs a port a cath for venous access Alex King, MD  02/23/2015, 8:15 AM

## 2015-02-23 NOTE — Discharge Instructions (Signed)
The drugs you were given will stay in your system until tomorrow, so for the next 24 hours you should not.  Drive an automobile. Make any legal decisions.  Drink any alcoholic beverages.  Today you should start with liquids and gradually work up to solid foods as your are able to tolerate them  Resume your regular medications as prescribed by your doctor.  Avoid aspirin for 24 hours.    Change the Band-Aid or dressing as needed.  After a 2 days no dressing as needed.  Avoid strenuous activity for the remainder of the day.  Please notify your primary physician immediately if you have any unusual bleeding, trouble breathing, fever >100 degrees or pain not relieved by the medication your doctor prescribed for your doctor prescribed for you physician  Restart your metformin today

## 2015-02-23 NOTE — Op Note (Signed)
      [facility]       Operative Note  Date: 02/23/2015  Preoperative diagnosis:  1. Hairy cell leukemia  Postoperative diagnosis:  Same as above  Procedures: #1. Ultrasound guidance for vascular access to the right internal jugular vein. #2. Fluoroscopic guidance for placement of catheter. #3. Placement of CT compatible Port-A-Cath, right internal jugular vein.  Surgeon: Leotis Pain, MD.   Assistant: Lawernce Keas, PA-Student  Anesthesia: Local with moderate conscious sedation.  Fluoroscopy time: less than 1 minute  Contrast used: 0  Estimated blood loss: minimal  Indication for the procedure:  The patient has hairy cell leukemia.  The patient needs a Port-A-Cath for durable venous access, chemotherapy, lab draws, and CT scans. We are asked to place this. Risks and benefits were discussed and informed consent was obtained.  Description of procedure: The patient was brought to the vascular and interventional radiology suite. The right neck chest and shoulder were sterilely prepped and draped, and a sterile surgical field was created. Ultrasound was used to help visualize a patent right internal jugular vein. This was then accessed under direct ultrasound guidance without difficulty with the Seldinger needle and a permanent image was recorded. A J-wire was placed. After skin nick and dilatation, the peel-away sheath was then placed over the wire. I then anesthetized an area under the clavicle approximately 1-2 fingerbreadths. A transverse incision was created and an inferior pocket was created with electrocautery and blunt dissection. The port was then brought onto the field, placed into the pocket and secured to the chest wall with 2 Prolene sutures. The catheter was connected to the port and tunneled from the subclavicular incision to the access site. Fluoroscopic guidance was then used to cut the catheter to an appropriate length. The catheter was then placed through  the peel-away sheath and the peel-away sheath was removed. The catheter tip was parked in excellent location under fluorocoscopic guidance just into the right atrium. The pocket was then irrigated with antibiotic impregnated saline and the wound was closed with a running 3-0 Vicryl and a 4-0 Monocryl. The access incision was closed with a single 4-0 Monocryl. The Huber needle was used to withdraw blood and flush the port with heparinized saline. Dermabond was then placed as a dressing. The patient tolerated the procedure well and was taken to the recovery room in stable condition.   Babara Buffalo 02/23/2015 9:58 AM

## 2015-02-24 ENCOUNTER — Encounter: Payer: Self-pay | Admitting: Vascular Surgery

## 2015-02-24 NOTE — Patient Instructions (Signed)
Rituximab injection What is this medicine? RITUXIMAB (ri TUX i mab) is a monoclonal antibody. This medicine changes the way the body's immune system works. It is used commonly to treat non-Hodgkin's lymphoma and other conditions. In cancer cells, this drug targets a specific protein within cancer cells and stops the cancer cells from growing. It is also used to treat rhuematoid arthritis (RA). In RA, this medicine slow the inflammatory process and help reduce joint pain and swelling. This medicine is often used with other cancer or arthritis medications. This medicine may be used for other purposes; ask your health care provider or pharmacist if you have questions. COMMON BRAND NAME(S): Rituxan What should I tell my health care provider before I take this medicine? They need to know if you have any of these conditions: -blood disorders -heart disease -history of hepatitis B -infection (especially a virus infection such as chickenpox, cold sores, or herpes) -irregular heartbeat -kidney disease -lung or breathing disease, like asthma -lupus -an unusual or allergic reaction to rituximab, mouse proteins, other medicines, foods, dyes, or preservatives -pregnant or trying to get pregnant -breast-feeding How should I use this medicine? This medicine is for infusion into a vein. It is administered in a hospital or clinic by a specially trained health care professional. A special MedGuide will be given to you by the pharmacist with each prescription and refill. Be sure to read this information carefully each time. Talk to your pediatrician regarding the use of this medicine in children. This medicine is not approved for use in children. Overdosage: If you think you have taken too much of this medicine contact a poison control center or emergency room at once. NOTE: This medicine is only for you. Do not share this medicine with others. What if I miss a dose? It is important not to miss a dose. Call  your doctor or health care professional if you are unable to keep an appointment. What may interact with this medicine? -cisplatin -medicines for blood pressure -some other medicines for arthritis -vaccines This list may not describe all possible interactions. Give your health care provider a list of all the medicines, herbs, non-prescription drugs, or dietary supplements you use. Also tell them if you smoke, drink alcohol, or use illegal drugs. Some items may interact with your medicine. What should I watch for while using this medicine? Report any side effects that you notice during your treatment right away, such as changes in your breathing, fever, chills, dizziness or lightheadedness. These effects are more common with the first dose. Visit your prescriber or health care professional for checks on your progress. You will need to have regular blood work. Report any other side effects. The side effects of this medicine can continue after you finish your treatment. Continue your course of treatment even though you feel ill unless your doctor tells you to stop. Call your doctor or health care professional for advice if you get a fever, chills or sore throat, or other symptoms of a cold or flu. Do not treat yourself. This drug decreases your body's ability to fight infections. Try to avoid being around people who are sick. This medicine may increase your risk to bruise or bleed. Call your doctor or health care professional if you notice any unusual bleeding. Be careful brushing and flossing your teeth or using a toothpick because you may get an infection or bleed more easily. If you have any dental work done, tell your dentist you are receiving this medicine. Avoid taking products   that contain aspirin, acetaminophen, ibuprofen, naproxen, or ketoprofen unless instructed by your doctor. These medicines may hide a fever. Do not become pregnant while taking this medicine. Women should inform their doctor  if they wish to become pregnant or think they might be pregnant. There is a potential for serious side effects to an unborn child. Talk to your health care professional or pharmacist for more information. Do not breast-feed an infant while taking this medicine. What side effects may I notice from receiving this medicine? Side effects that you should report to your doctor or health care professional as soon as possible: -allergic reactions like skin rash, itching or hives, swelling of the face, lips, or tongue -low blood counts - this medicine may decrease the number of white blood cells, red blood cells and platelets. You may be at increased risk for infections and bleeding. -signs of infection - fever or chills, cough, sore throat, pain or difficulty passing urine -signs of decreased platelets or bleeding - bruising, pinpoint red spots on the skin, black, tarry stools, blood in the urine -signs of decreased red blood cells - unusually weak or tired, fainting spells, lightheadedness -breathing problems -confused, not responsive -chest pain -fast, irregular heartbeat -feeling faint or lightheaded, falls -mouth sores -redness, blistering, peeling or loosening of the skin, including inside the mouth -stomach pain -swelling of the ankles, feet, or hands -trouble passing urine or change in the amount of urine Side effects that usually do not require medical attention (report to your doctor or other health care professional if they continue or are bothersome): -anxiety -headache -loss of appetite -muscle aches -nausea -night sweats This list may not describe all possible side effects. Call your doctor for medical advice about side effects. You may report side effects to FDA at 1-800-FDA-1088. Where should I keep my medicine? This drug is given in a hospital or clinic and will not be stored at home. NOTE: This sheet is a summary. It may not cover all possible information. If you have questions  about this medicine, talk to your doctor, pharmacist, or health care provider.  2015, [employer]. (2008-04-04 14:04:59) Cladribine injection for infusion What is this medicine? CLADRIBINE (KLA dri been) is a chemotherapy drug. This medicine reduces the growth of cancer cells and can suppress the immune system. It is used for treating leukemias. This medicine may be used for other purposes; ask your health care provider or pharmacist if you have questions. COMMON BRAND NAME(S): Leustatin What should I tell my health care provider before I take this medicine? They need to know if you have any of these conditions: -bleeding problems -infection (especially a virus infection such as chickenpox, cold sores, or herpes) -kidney disease -liver disease -an unusual or allergic reaction to cladribine, benzyl alcohol, other medicines, foods, dyes, or preservatives -pregnant or trying to get pregnant -breast-feeding How should I use this medicine? This drug is given as an infusion into a vein. It is administered in a hospital or clinic by a specially trained health care professional. Talk to your pediatrician regarding the use of this medicine in children. While this drug may be prescribed for children for selected conditions, precautions do apply. Overdosage: If you think you have taken too much of this medicine contact a poison control center or emergency room at once. NOTE: This medicine is only for you. Do not share this medicine with others. What if I miss a dose? It is important not to miss a dose. Call your doctor or health  care professional if you are unable to keep an appointment. What may interact with this medicine? -vaccines Talk to your doctor or health care professional before taking any of these medicines: -acetaminophen -aspirin -ibuprofen -naproxen -ketoprofen This list may not describe all possible interactions. Give your health care provider a list of all the  medicines, herbs, non-prescription drugs, or dietary supplements you use. Also tell them if you smoke, drink alcohol, or use illegal drugs. Some items may interact with your medicine. What should I watch for while using this medicine? This drug may make you feel generally unwell. This is not uncommon, as chemotherapy can affect healthy cells as well as cancer cells. Report any side effects. Continue your course of treatment even though you feel ill unless your doctor tells you to stop. In some cases, you may be given additional medicines to help with side effects. Follow all directions for their use. Call your doctor or health care professional for advice if you get a fever, chills or sore throat, or other symptoms of a cold or flu. Do not treat yourself. This drug decreases your body's ability to fight infections. Try to avoid being around people who are sick. This medicine may increase your risk to bruise or bleed. Call your doctor or health care professional if you notice any unusual bleeding. Be careful brushing and flossing your teeth or using a toothpick because you may get an infection or bleed more easily. If you have any dental work done, tell your dentist you are receiving this medicine. Avoid taking products that contain aspirin, acetaminophen, ibuprofen, naproxen, or ketoprofen unless instructed by your doctor. These medicines may hide a fever. Do not become pregnant while taking this medicine. Women should inform their doctor if they wish to become pregnant or think they might be pregnant. There is a potential for serious side effects to an unborn child. Talk to your health care professional or pharmacist for more information. Do not breast-feed an infant while taking this medicine. If you are a man, you should not father a child while receiving treatment. What side effects may I notice from receiving this medicine? Side effects that you should report to your doctor or health care  professional as soon as possible: -allergic reactions like skin rash, itching or hives, swelling of the face, lips, or tongue -low blood counts - This drug may decrease the number of white blood cells, red blood cells and platelets. You may be at increased risk for infections and bleeding. -signs of infection - fever or chills, cough, sore throat, pain or difficulty passing urine -signs of decreased platelets or bleeding - bruising, pinpoint red spots on the skin, black, tarry stools, nosebleeds -signs of decreased red blood cells - unusually weak or tired, fainting spells, lightheadedness -abdominal pain -breathing problems -dizziness -mouth sores -trouble passing urine or change in the amount of urine Side effects that usually do not require medical attention (report to your doctor or health care professional if they continue or are bothersome): -diarrhea -headache -loss of appetite -nausea, vomiting -pain or redness at the injection site -weak or tired This list may not describe all possible side effects. Call your doctor for medical advice about side effects. You may report side effects to FDA at 1-800-FDA-1088. Where should I keep my medicine? This drug is given in a hospital or clinic and will not be stored at home. NOTE: This sheet is a summary. It may not cover all possible information. If you have questions about this  medicine, talk to your doctor, pharmacist, or health care provider.  2015, [employer]. (2007-11-10 14:42:56)

## 2015-02-26 ENCOUNTER — Other Ambulatory Visit: Payer: Self-pay | Admitting: Hematology and Oncology

## 2015-02-28 ENCOUNTER — Inpatient Hospital Stay: Payer: Medicare Other

## 2015-02-28 ENCOUNTER — Telehealth: Payer: Self-pay | Admitting: *Deleted

## 2015-02-28 MED ORDER — LIDOCAINE-PRILOCAINE 2.5-2.5 % EX CREA: 1.0000 "application " | TOPICAL_CREAM | CUTANEOUS | Status: DC | PRN

## 2015-02-28 NOTE — Telephone Encounter (Signed)
Escribed

## 2015-03-03 ENCOUNTER — Telehealth: Payer: Self-pay

## 2015-03-03 ENCOUNTER — Inpatient Hospital Stay (HOSPITAL_BASED_OUTPATIENT_CLINIC_OR_DEPARTMENT_OTHER): Payer: Medicare Other | Admitting: Hematology and Oncology

## 2015-03-03 ENCOUNTER — Inpatient Hospital Stay: Payer: Medicare Other

## 2015-03-03 ENCOUNTER — Ambulatory Visit: Payer: Medicare Other

## 2015-03-03 VITALS — BP 177/83 | HR 79 | Temp 97.9°F | Ht 68.0 in | Wt 222.2 lb

## 2015-03-03 DIAGNOSIS — E119 Type 2 diabetes mellitus without complications: Secondary | ICD-10-CM

## 2015-03-03 DIAGNOSIS — R0602 Shortness of breath: Secondary | ICD-10-CM | POA: Diagnosis not present

## 2015-03-03 DIAGNOSIS — E785 Hyperlipidemia, unspecified: Secondary | ICD-10-CM | POA: Diagnosis not present

## 2015-03-03 DIAGNOSIS — R531 Weakness: Secondary | ICD-10-CM | POA: Diagnosis not present

## 2015-03-03 DIAGNOSIS — Z87891 Personal history of nicotine dependence: Secondary | ICD-10-CM

## 2015-03-03 DIAGNOSIS — C9140 Hairy cell leukemia not having achieved remission: Secondary | ICD-10-CM

## 2015-03-03 DIAGNOSIS — Z8701 Personal history of pneumonia (recurrent): Secondary | ICD-10-CM

## 2015-03-03 DIAGNOSIS — Z79899 Other long term (current) drug therapy: Secondary | ICD-10-CM

## 2015-03-03 DIAGNOSIS — R5383 Other fatigue: Secondary | ICD-10-CM

## 2015-03-03 DIAGNOSIS — D61818 Other pancytopenia: Secondary | ICD-10-CM | POA: Diagnosis not present

## 2015-03-03 DIAGNOSIS — I10 Essential (primary) hypertension: Secondary | ICD-10-CM | POA: Diagnosis not present

## 2015-03-03 DIAGNOSIS — Z794 Long term (current) use of insulin: Secondary | ICD-10-CM

## 2015-03-03 DIAGNOSIS — K5790 Diverticulosis of intestine, part unspecified, without perforation or abscess without bleeding: Secondary | ICD-10-CM

## 2015-03-03 DIAGNOSIS — N400 Enlarged prostate without lower urinary tract symptoms: Secondary | ICD-10-CM

## 2015-03-03 DIAGNOSIS — Z7982 Long term (current) use of aspirin: Secondary | ICD-10-CM | POA: Diagnosis not present

## 2015-03-03 DIAGNOSIS — G4700 Insomnia, unspecified: Secondary | ICD-10-CM

## 2015-03-03 DIAGNOSIS — E538 Deficiency of other specified B group vitamins: Secondary | ICD-10-CM | POA: Diagnosis not present

## 2015-03-03 DIAGNOSIS — K5900 Constipation, unspecified: Secondary | ICD-10-CM | POA: Diagnosis not present

## 2015-03-03 DIAGNOSIS — K219 Gastro-esophageal reflux disease without esophagitis: Secondary | ICD-10-CM

## 2015-03-03 DIAGNOSIS — Z85828 Personal history of other malignant neoplasm of skin: Secondary | ICD-10-CM

## 2015-03-03 DIAGNOSIS — D649 Anemia, unspecified: Secondary | ICD-10-CM | POA: Diagnosis not present

## 2015-03-03 LAB — COMPREHENSIVE METABOLIC PANEL
ALT: 15 U/L — ABNORMAL LOW (ref 17–63)
AST: 22 U/L (ref 15–41)
Albumin: 3.8 g/dL (ref 3.5–5.0)
Alkaline Phosphatase: 51 U/L (ref 38–126)
Anion gap: 8 (ref 5–15)
BUN: 25 mg/dL — ABNORMAL HIGH (ref 6–20)
CO2: 25 mmol/L (ref 22–32)
Calcium: 8.4 mg/dL — ABNORMAL LOW (ref 8.9–10.3)
Chloride: 104 mmol/L (ref 101–111)
Creatinine, Ser: 1.23 mg/dL (ref 0.61–1.24)
GFR calc Af Amer: >60 mL/min (ref >60)
GFR calc non Af Amer: 54 mL/min — ABNORMAL LOW (ref >60)
Glucose, Bld: 302 mg/dL — ABNORMAL HIGH (ref 65–99)
Potassium: 4.3 mmol/L (ref 3.5–5.1)
Sodium: 137 mmol/L (ref 135–145)
Total Bilirubin: 0.5 mg/dL (ref 0.3–1.2)
Total Protein: 7.1 g/dL (ref 6.5–8.1)

## 2015-03-03 LAB — CBC WITH DIFFERENTIAL/PLATELET
Basophils Absolute: 0 10*3/uL (ref 0–0.1)
Basophils Relative: 1 %
Eosinophils Absolute: 0.1 10*3/uL (ref 0–0.7)
Eosinophils Relative: 4 %
HCT: 28 % — ABNORMAL LOW (ref 40.0–52.0)
Hemoglobin: 9.4 g/dL — ABNORMAL LOW (ref 13.0–18.0)
Lymphocytes Relative: 47 %
Lymphs Abs: 0.8 10*3/uL — ABNORMAL LOW (ref 1.0–3.6)
MCH: 36.5 pg — ABNORMAL HIGH (ref 26.0–34.0)
MCHC: 33.7 g/dL (ref 32.0–36.0)
MCV: 108.4 fL — ABNORMAL HIGH (ref 80.0–100.0)
Monocytes Absolute: 0 10*3/uL — ABNORMAL LOW (ref 0.2–1.0)
Monocytes Relative: 2 %
Neutro Abs: 0.8 10*3/uL — ABNORMAL LOW (ref 1.4–6.5)
Neutrophils Relative %: 46 %
Platelets: 116 10*3/uL — ABNORMAL LOW (ref 150–440)
RBC: 2.58 MIL/uL — ABNORMAL LOW (ref 4.40–5.90)
RDW: 15.6 % — ABNORMAL HIGH (ref 11.5–14.5)
WBC: 1.7 10*3/uL — ABNORMAL LOW (ref 3.8–10.6)

## 2015-03-03 MED ORDER — CLADRIBINE CHEMO INJECTION 10 MG/10ML: 0.1000 mg/kg/d | INTRAVENOUS | Status: DC

## 2015-03-03 MED ORDER — SODIUM CHLORIDE BACTERIOSTATIC 0.9 % IJ SOLN
70.0000 mg | INTRAMUSCULAR | Status: AC
  Administered 2015-03-03: 70 mg via INTRAVENOUS
  Filled 2015-03-03: qty 70

## 2015-03-03 MED ORDER — SODIUM CHLORIDE 0.9 % IV SOLN
Freq: Once | INTRAVENOUS | Status: AC
  Administered 2015-03-03: 12:00:00 via INTRAVENOUS
  Filled 2015-03-03: qty 4

## 2015-03-03 MED ORDER — HEPARIN SOD (PORK) LOCK FLUSH 100 UNIT/ML IV SOLN: 500.0000 [IU] | Freq: Once | INTRAVENOUS | Status: AC

## 2015-03-03 MED ORDER — TEMAZEPAM 7.5 MG PO CAPS: 7.5000 mg | ORAL_CAPSULE | Freq: Every evening | ORAL | Status: DC | PRN

## 2015-03-03 MED ORDER — SODIUM CHLORIDE 0.9 % IJ SOLN
10.0000 mL | INTRAMUSCULAR | Status: AC | PRN
  Administered 2015-03-03: 10 mL via INTRAVENOUS
  Filled 2015-03-03: qty 10

## 2015-03-03 MED ORDER — SODIUM CHLORIDE 0.9 % IV SOLN
Freq: Once | INTRAVENOUS | Status: AC
  Administered 2015-03-03: 12:00:00 via INTRAVENOUS
  Filled 2015-03-03: qty 1000

## 2015-03-03 MED ORDER — ONDANSETRON HCL 8 MG PO TABS: 8.0000 mg | ORAL_TABLET | Freq: Three times a day (TID) | ORAL | Status: DC | PRN

## 2015-03-03 NOTE — Progress Notes (Signed)
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

## 2015-03-03 NOTE — Telephone Encounter (Signed)
Faxed rx for Restoril 7.5 mg take 1 capsule by mouth at bedtime as needed for sleep to [facility] at [phone]; spoke with [facility] at [facility]

## 2015-03-03 NOTE — Telephone Encounter (Signed)
Neutrophil =0.8.  MD ok to go ahead with treatment

## 2015-03-05 ENCOUNTER — Encounter: Payer: Self-pay | Admitting: Hematology and Oncology

## 2015-03-05 ENCOUNTER — Other Ambulatory Visit: Payer: Self-pay | Admitting: Hematology and Oncology

## 2015-03-05 DIAGNOSIS — C9140 Hairy cell leukemia not having achieved remission: Secondary | ICD-10-CM

## 2015-03-05 NOTE — Progress Notes (Signed)
[facility] day:  03/03/2015  Chief Complaint: Alex Smith is a 80 y.o. male with recurrent hairy cell leukemia who is seen assessment prior to initiation of cladribine.  HPI:  The patient was last seen in the medical oncology clinic on 02/17/2015.  At that time, bone marrow from 02/07/2015 was reviewed.  Pathology revealed a extensive marrow involvement by recurrent hairy cell leukemia (approximately 80% of cells in the core). There was a monoclonal plasma cell infiltrate (approximately 10%), compatible with a plasma cell neoplasm. Marrow was hypercellular for age (40-50%) with markedly diminished residual trilineage hematopoiesis and mild multilineage dyspoiesis. There was diffuse mild to focally moderate increase in reticulin. Storage iron was present.   Peripheral smear revealed leukopenia (WBC 1900) with 2% atypical lymphoid cells compatible with hairy cell leukemia. There was moderate anemia with anisopoikilocytosis. 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  FISH studies for MDS were negative. Cytogenetics were normal (46, XY).  During the interim, he denies any new complaints.  Port-a-cath was placed on 02/23/2015.  Past Medical History  Diagnosis Date  . Hypertension   . GERD (gastroesophageal reflux disease)   . HLD (hyperlipidemia)   . History of pneumonia 2000's    "once" (07/07/2012)  . Type II diabetes mellitus   . Hairy cell leukemia 2006    followed by Dr. Cynda Acres  . Basal cell carcinoma of face   . Diverticulosis   . Colon polyps   . BPH (benign prostatic hypertrophy)     followed by urology, discharged (Dr. Bernardo Heater)  . B12 deficiency   . History of shingles   . Shortness of breath dyspnea   . Pneumonia     Past Surgical History  Procedure Laterality Date  . Eye surgery Left 06/2012    laser surgery  . Cataract extraction w/ intraocular lens  implant,  bilateral  ~ 2010  . Skin cancer excision      "all over my face" (07/07/2012)  . 25 gauge pars plana vitrectomy with 20 gauge mvr port for macular hole  07/07/2012    Procedure: 25 GAUGE PARS PLANA VITRECTOMY WITH 20 GAUGE MVR PORT FOR MACULAR HOLE;  Surgeon: Hayden Pedro, MD;  Location: [facility];  Service: Ophthalmology;  Laterality: Left;  . Serum patch  07/07/2012    Procedure: SERUM PATCH;  Surgeon: Hayden Pedro, MD;  Location: [facility];  Service: Ophthalmology;  Laterality: Left;  . Gas insertion  07/07/2012    Procedure: INSERTION OF GAS;  Surgeon: Hayden Pedro, MD;  Location: [facility];  Service: Ophthalmology;  Laterality: Left;  [id number]  . Cardiovascular stress test  2013    treadmill - no evidence ischemia, EF 61%  . Colonoscopy  2014    [facility] WNL no rpt needed, h/o polyps  . Peripheral vascular catheterization N/A 02/23/2015    Procedure: Glori Luis Cath Insertion;  Surgeon: Algernon Huxley, MD;  Location: [facility];  Service: Cardiovascular;  Laterality: N/A;    Family History  Problem Relation Age of Onset  . Dementia Mother   . Heart failure Father 2  . Cancer Sister     breast  . Diabetes Paternal Uncle   . Diabetes Paternal Aunt   . CAD Brother 76    MI  . Stroke Neg Hx     Social History:  reports that he quit smoking about 46 years ago. His smoking  use included Cigarettes. He has a 50 pack-year smoking history. He has never used smokeless tobacco. He reports that he drinks alcohol. He reports that he does not use illicit drugs.  He works at [employer] 20 hours/week.  The patient is accompanied by his wife and daughter.  Allergies:  Allergies  Allergen Reactions  . Rituximab Rash    Chest tightness  . Blood-Group Specific Substance Other (See Comments)    Had a post transfusion reaction of red blood cells; NOW REQUIRES WASHED BLOOD CELLS  . Primaxin [Imipenem] Other (See Comments)    Possible allergy  . Voriconazole Other (See Comments)  . Sulfa Antibiotics  Itching and Rash  Possible allergy to Zoloft, allopurinol, and a chemotherapy medication.  Current Medications: Current Outpatient Prescriptions  Medication Sig Dispense Refill  . acyclovir (ZOVIRAX) 400 MG tablet Take 1 tablet (400 mg total) by mouth 2 (two) times daily. For leukemia 60 tablet 0  . aspirin EC 81 MG tablet Take 81 mg by mouth daily.    Marland Kitchen glucose blood (ACCU-CHEK AVIVA PLUS) test strip Use to check sugar twice daily Dx:E11.9 200 each 3  . hydrochlorothiazide (HYDRODIURIL) 25 MG tablet Take 1 tablet (25 mg total) by mouth daily.    . insulin NPH Human (HUMULIN N) 100 UNIT/ML injection Inject 0.35 mLs (35 Units total) into the skin 2 (two) times daily before a meal. 20 mL 11  . insulin regular (HUMULIN R) 100 units/mL injection Inject 0.1 mLs (10 Units total) into the skin 2 (two) times daily before a meal. 10 mL 11  . lidocaine-prilocaine (EMLA) cream Apply 1 application topically as needed. 30 g 3  . lisinopril (PRINIVIL,ZESTRIL) 20 MG tablet TAKE ONE TABLET BY MOUTH TWICE DAILY 60 tablet 6  . metFORMIN (GLUCOPHAGE) 1000 MG tablet TAKE ONE TABLET BY MOUTH TWICE DAILY AS DIRECTED 180 tablet 3  . omeprazole (PRILOSEC) 20 MG capsule Take 40 mg by mouth daily.     . pravastatin (PRAVACHOL) 20 MG tablet TAKE ONE TABLET AT BEDTIME 30 tablet 6  . vitamin B-12 (CYANOCOBALAMIN) 1000 MCG tablet Take 1,000 mcg by mouth daily.    . vitamin E 400 UNIT capsule Take 400 Units by mouth daily.    . ondansetron (ZOFRAN) 8 MG tablet Take 1 tablet (8 mg total) by mouth every 8 (eight) hours as needed for nausea or vomiting. 20 tablet 0  . tamsulosin (FLOMAX) 0.4 MG CAPS capsule Take 0.4 mg by mouth daily.    . temazepam (RESTORIL) 7.5 MG capsule Take 1 capsule (7.5 mg total) by mouth at bedtime as needed for sleep (insomnia). 30 capsule 0   No current facility-administered medications for this visit.   Facility-Administered Medications Ordered in Other Visits  Medication Dose Route Frequency  Provider Last Rate Last Dose  . cladribine (LEUSTATIN) 70 mg in sodium chloride bacteriostatic 0.9 % 30 mL chemo infusion  70 mg Intravenous 7 days Lequita Asal, MD   70 mg at 03/03/15 1240  . heparin lock flush 100 unit/mL  500 Units Intravenous Once Lequita Asal, MD      . sodium chloride 0.9 % injection 10 mL  10 mL Intravenous PRN Lequita Asal, MD   10 mL at 03/03/15 9798    Review of Systems:  GENERAL:  Fatigue.  No fevers or sweats. PERFORMANCE STATUS (ECOG):  1-2 HEENT:  No visual changes, runny nose, sore throat. Pulmonary:  No shortness of breath.  No cough.  No hemoptysis. Cardiac:  No chest pain, palpitations, orthopnea, or PND. GI:  No nausea, vomiting, diarrhea, constipation, melena or hematochezia. GU:  No urgency, frequency, dysuria, or hematuria. Musculoskeletal:  No back pain.  No joint pain.  No muscle tenderness. Extremities:  No pain or swelling. Skin:  No rashes or skin changes. Neuro:  No headache, numbness or weakness, balance or coordination issues. Endocrine:  Diabetes.  No thyroid issues, hot flashes or night sweats. Psych:  No mood changes, depression or anxiety. Pain:  No focal pain. Review of systems:  All other systems reviewed and found to be negative.   Physical Exam: Blood pressure 177/83, pulse 79, temperature 97.9 F (36.6 C), temperature source Tympanic, height 5' 8"  (1.727 m), weight 222 lb 3.6 oz (100.8 kg).  GENERAL:  Well developed, well nourished, sitting comfortably in the exam room in no acute distress. MENTAL STATUS:  Alert and oriented to person, place and time. HEAD:  Pearline Cables hair.  Normocephalic, atraumatic, face symmetric, no Cushingoid features. EYES:  Dark blue eyes.  Pupils equal round and reactive to light and accomodation.  No conjunctivitis or scleral icterus. ENT:  Oropharynx clear without lesion.  Tongue normal. Mucous membranes moist.  CHEST:  Unremarkable port-a-cath site. RESPIRATORY:  Clear to auscultation  without rales, wheezes or rhonchi. CARDIOVASCULAR:  Regular rate and rhythm without murmur, rub or gallop. ABDOMEN:  Soft, non-tender, with active bowel sounds, and no hepatosplenomegaly.  No masses. SKIN:  No rashes, ulcers or lesions. EXTREMITIES: No edema, no skin discoloration or tenderness.  No palpable cords. LYMPH NODES: No palpable cervical, supraclavicular, axillary or inguinal adenopathy  NEUROLOGICAL: Unremarkable. PSYCH:  Appropriate.   Appointment on 03/03/2015  Component Date Value Ref Range Status  . Sodium 03/03/2015 137  135 - 145 mmol/L Final  . Potassium 03/03/2015 4.3  3.5 - 5.1 mmol/L Final  . Chloride 03/03/2015 104  101 - 111 mmol/L Final  . CO2 03/03/2015 25  22 - 32 mmol/L Final  . Glucose, Bld 03/03/2015 302* 65 - 99 mg/dL Final  . BUN 03/03/2015 25* 6 - 20 mg/dL Final  . Creatinine, Ser 03/03/2015 1.23  0.61 - 1.24 mg/dL Final  . Calcium 03/03/2015 8.4* 8.9 - 10.3 mg/dL Final  . Total Protein 03/03/2015 7.1  6.5 - 8.1 g/dL Final  . Albumin 03/03/2015 3.8  3.5 - 5.0 g/dL Final  . AST 03/03/2015 22  15 - 41 U/L Final  . ALT 03/03/2015 15* 17 - 63 U/L Final  . Alkaline Phosphatase 03/03/2015 51  38 - 126 U/L Final  . Total Bilirubin 03/03/2015 0.5  0.3 - 1.2 mg/dL Final  . GFR calc non Af Amer 03/03/2015 54* >60 mL/min Final  . GFR calc Af Amer 03/03/2015 >60  >60 mL/min Final   Comment: (NOTE) The eGFR has been calculated using the CKD EPI equation. This calculation has not been validated in all clinical situations. eGFR's persistently <60 mL/min signify possible Chronic Kidney Disease.   . Anion gap 03/03/2015 8  5 - 15 Final  . WBC 03/03/2015 1.7* 3.8 - 10.6 K/uL Final  . RBC 03/03/2015 2.58* 4.40 - 5.90 MIL/uL Final  . Hemoglobin 03/03/2015 9.4* 13.0 - 18.0 g/dL Final  . HCT 03/03/2015 28.0* 40.0 - 52.0 % Final  . MCV 03/03/2015 108.4* 80.0 - 100.0 fL Final  . MCH 03/03/2015 36.5* 26.0 - 34.0 pg Final  . MCHC 03/03/2015 33.7  32.0 - 36.0 g/dL Final   . RDW 03/03/2015 15.6* 11.5 - 14.5 % Final  . Platelets  03/03/2015 116* 150 - 440 K/uL Final  . Neutrophils Relative % 03/03/2015 46   Final  . Neutro Abs 03/03/2015 0.8* 1.4 - 6.5 K/uL Final  . Lymphocytes Relative 03/03/2015 47   Final  . Lymphs Abs 03/03/2015 0.8* 1.0 - 3.6 K/uL Final  . Monocytes Relative 03/03/2015 2   Final  . Monocytes Absolute 03/03/2015 0.0* 0.2 - 1.0 K/uL Final  . Eosinophils Relative 03/03/2015 4   Final  . Eosinophils Absolute 03/03/2015 0.1  0 - 0.7 K/uL Final  . Basophils Relative 03/03/2015 1   Final  . Basophils Absolute 03/03/2015 0.0  0 - 0.1 K/uL Final    Assessment:  Alex Smith is a 80 y.o. 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  He was diagnosed in 2006.  He received cladribine 11/06-11/05/2005.  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  He has an unclear allergy history.  He may be allergic to Primaxin, voriconazole, Zoloft, allopurinol, and a chemotherapy medication.  Reaction occurred in 06/2005.  He is allergic to sulfa.  He requires washed RBCs.  Bone marrow aspirate and biopsy on 02/07/2015 revealed a extensive marrow involvement by recurrent hairy cell leukemia (approximately 80% of cells in the core). There was a monoclonal plasma cell infiltrate (approximately 10%), compatible with a plasma cell neoplasm. Marrow was hypercellular (40-50%) with markedly diminished residual trilineage hematopoiesis and mild multilineage dyspoiesis.  Peripheral smear revealed leukopenia (WBC 1900) with 2% atypical lymphoid cells compatible with hairy cell leukemia. There was moderate anemia with anisopoikilocytosis. 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  FISH studies for MDS were negative.  Cytogenetics were normal (46, XY).  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  He completed a course of Levaquin for pneumonia.  Symptomatically, he denies any  complaints.  Exam is stable.  Plan: 1. Labs today:  CBC with diff, CMP. 2. Discuss chemotherapy plan for cladribine alone.  Discuss continuous infusion for 7 days via port-a-cath.  Discuss follow-up approximately 1/2 way through infusion. Discuss patient's concern about initial treatment with Rituxan and seizure within minutes.  Discuss patient will not be receiving Rituxan.  Discuss plan for observation in clinic for 1-2 hours after pump initiated.  Patient consented to treatment. 3. Anticipate initiation of prophylactic antibiotics, antifungals, possible anti-PCP medications as anticipate prolonged neutropenia given baseline counts and treatment.  Patient will remain on acyclovir for VZV prophylaxis.  Patient has a possible allergy to voriconazole and Septra.  Consider dapsone or monthly pentamidine for PCP prophylaxis.  Will need fungal coverage.  Refer to infectious disease, Dr. Ola Spurr, for management. 4. Discuss fever and neutropenia precautions. 5. RTC on 03/07/2015 for MD assessment.   Lequita Asal, MD

## 2015-03-07 ENCOUNTER — Encounter: Payer: Self-pay | Admitting: Hematology and Oncology

## 2015-03-07 ENCOUNTER — Inpatient Hospital Stay (HOSPITAL_BASED_OUTPATIENT_CLINIC_OR_DEPARTMENT_OTHER): Payer: Medicare Other | Admitting: Hematology and Oncology

## 2015-03-07 VITALS — BP 164/72 | HR 91 | Temp 96.0°F | Resp 20 | Wt 223.1 lb

## 2015-03-07 DIAGNOSIS — D649 Anemia, unspecified: Secondary | ICD-10-CM | POA: Diagnosis not present

## 2015-03-07 DIAGNOSIS — E538 Deficiency of other specified B group vitamins: Secondary | ICD-10-CM

## 2015-03-07 DIAGNOSIS — C9140 Hairy cell leukemia not having achieved remission: Secondary | ICD-10-CM

## 2015-03-07 DIAGNOSIS — E119 Type 2 diabetes mellitus without complications: Secondary | ICD-10-CM

## 2015-03-07 DIAGNOSIS — K5900 Constipation, unspecified: Secondary | ICD-10-CM

## 2015-03-07 DIAGNOSIS — I10 Essential (primary) hypertension: Secondary | ICD-10-CM

## 2015-03-07 DIAGNOSIS — E785 Hyperlipidemia, unspecified: Secondary | ICD-10-CM

## 2015-03-07 DIAGNOSIS — Z85828 Personal history of other malignant neoplasm of skin: Secondary | ICD-10-CM | POA: Diagnosis not present

## 2015-03-07 DIAGNOSIS — D61818 Other pancytopenia: Secondary | ICD-10-CM | POA: Diagnosis not present

## 2015-03-07 DIAGNOSIS — R0602 Shortness of breath: Secondary | ICD-10-CM | POA: Diagnosis not present

## 2015-03-07 DIAGNOSIS — R531 Weakness: Secondary | ICD-10-CM | POA: Diagnosis not present

## 2015-03-07 DIAGNOSIS — Z7982 Long term (current) use of aspirin: Secondary | ICD-10-CM

## 2015-03-07 DIAGNOSIS — Z87891 Personal history of nicotine dependence: Secondary | ICD-10-CM

## 2015-03-07 DIAGNOSIS — N400 Enlarged prostate without lower urinary tract symptoms: Secondary | ICD-10-CM | POA: Diagnosis not present

## 2015-03-07 DIAGNOSIS — Z79899 Other long term (current) drug therapy: Secondary | ICD-10-CM

## 2015-03-07 DIAGNOSIS — Z8701 Personal history of pneumonia (recurrent): Secondary | ICD-10-CM

## 2015-03-07 DIAGNOSIS — R5383 Other fatigue: Secondary | ICD-10-CM

## 2015-03-07 DIAGNOSIS — Z794 Long term (current) use of insulin: Secondary | ICD-10-CM | POA: Diagnosis not present

## 2015-03-07 DIAGNOSIS — K5790 Diverticulosis of intestine, part unspecified, without perforation or abscess without bleeding: Secondary | ICD-10-CM

## 2015-03-07 DIAGNOSIS — K219 Gastro-esophageal reflux disease without esophagitis: Secondary | ICD-10-CM | POA: Diagnosis not present

## 2015-03-07 NOTE — Progress Notes (Signed)
Patient here for follow up for Hairy Cell Leukemia. Patient states he has had increased fatigue for 2-3 weeks and feels like it is worse today.  Patient states he needs a refill on Acyclovir.  Patients has Port Cath Right chest with needle intact to pump. Dressing intact but loose around edges. Dressing changed with new tegaderm applied. Site without  Redness, drainage or swelling.

## 2015-03-09 ENCOUNTER — Telehealth: Payer: Self-pay | Admitting: *Deleted

## 2015-03-09 DIAGNOSIS — C9140 Hairy cell leukemia not having achieved remission: Secondary | ICD-10-CM

## 2015-03-09 MED ORDER — ACYCLOVIR 400 MG PO TABS: 400.0000 mg | ORAL_TABLET | Freq: Two times a day (BID) | ORAL | Status: DC

## 2015-03-09 NOTE — Telephone Encounter (Signed)
Escribed

## 2015-03-10 ENCOUNTER — Inpatient Hospital Stay: Payer: Medicare Other

## 2015-03-10 VITALS — BP 150/75 | HR 99 | Temp 97.7°F

## 2015-03-10 DIAGNOSIS — I10 Essential (primary) hypertension: Secondary | ICD-10-CM | POA: Diagnosis not present

## 2015-03-10 DIAGNOSIS — E119 Type 2 diabetes mellitus without complications: Secondary | ICD-10-CM | POA: Diagnosis not present

## 2015-03-10 DIAGNOSIS — Z889 Allergy status to unspecified drugs, medicaments and biological substances status: Secondary | ICD-10-CM | POA: Diagnosis not present

## 2015-03-10 DIAGNOSIS — Z85828 Personal history of other malignant neoplasm of skin: Secondary | ICD-10-CM | POA: Diagnosis not present

## 2015-03-10 DIAGNOSIS — Z8701 Personal history of pneumonia (recurrent): Secondary | ICD-10-CM | POA: Diagnosis not present

## 2015-03-10 DIAGNOSIS — K219 Gastro-esophageal reflux disease without esophagitis: Secondary | ICD-10-CM | POA: Diagnosis not present

## 2015-03-10 DIAGNOSIS — K5900 Constipation, unspecified: Secondary | ICD-10-CM | POA: Diagnosis not present

## 2015-03-10 DIAGNOSIS — D649 Anemia, unspecified: Secondary | ICD-10-CM | POA: Diagnosis not present

## 2015-03-10 DIAGNOSIS — E785 Hyperlipidemia, unspecified: Secondary | ICD-10-CM | POA: Diagnosis not present

## 2015-03-10 DIAGNOSIS — R531 Weakness: Secondary | ICD-10-CM | POA: Diagnosis not present

## 2015-03-10 DIAGNOSIS — K5790 Diverticulosis of intestine, part unspecified, without perforation or abscess without bleeding: Secondary | ICD-10-CM | POA: Diagnosis not present

## 2015-03-10 DIAGNOSIS — Z79899 Other long term (current) drug therapy: Secondary | ICD-10-CM | POA: Diagnosis not present

## 2015-03-10 DIAGNOSIS — R0602 Shortness of breath: Secondary | ICD-10-CM | POA: Diagnosis not present

## 2015-03-10 DIAGNOSIS — C801 Malignant (primary) neoplasm, unspecified: Secondary | ICD-10-CM

## 2015-03-10 DIAGNOSIS — D61818 Other pancytopenia: Secondary | ICD-10-CM | POA: Diagnosis not present

## 2015-03-10 DIAGNOSIS — R5383 Other fatigue: Secondary | ICD-10-CM | POA: Diagnosis not present

## 2015-03-10 DIAGNOSIS — E538 Deficiency of other specified B group vitamins: Secondary | ICD-10-CM | POA: Diagnosis not present

## 2015-03-10 DIAGNOSIS — N400 Enlarged prostate without lower urinary tract symptoms: Secondary | ICD-10-CM | POA: Diagnosis not present

## 2015-03-10 DIAGNOSIS — Z794 Long term (current) use of insulin: Secondary | ICD-10-CM | POA: Diagnosis not present

## 2015-03-10 DIAGNOSIS — Z7982 Long term (current) use of aspirin: Secondary | ICD-10-CM | POA: Diagnosis not present

## 2015-03-10 DIAGNOSIS — Z87891 Personal history of nicotine dependence: Secondary | ICD-10-CM | POA: Diagnosis not present

## 2015-03-10 DIAGNOSIS — C9140 Hairy cell leukemia not having achieved remission: Secondary | ICD-10-CM | POA: Diagnosis not present

## 2015-03-10 MED ORDER — SODIUM CHLORIDE 0.9 % IJ SOLN
10.0000 mL | INTRAMUSCULAR | Status: AC | PRN
  Administered 2015-03-10: 10 mL
  Filled 2015-03-10: qty 10

## 2015-03-10 MED ORDER — HEPARIN SOD (PORK) LOCK FLUSH 100 UNIT/ML IV SOLN
500.0000 [IU] | Freq: Once | INTRAVENOUS | Status: AC
  Administered 2015-03-10: 500 [IU] via INTRAVENOUS

## 2015-03-15 ENCOUNTER — Encounter: Payer: Self-pay | Admitting: Hematology and Oncology

## 2015-03-16 ENCOUNTER — Inpatient Hospital Stay: Payer: Medicare Other

## 2015-03-16 ENCOUNTER — Inpatient Hospital Stay (HOSPITAL_BASED_OUTPATIENT_CLINIC_OR_DEPARTMENT_OTHER): Payer: Medicare Other | Admitting: Hematology and Oncology

## 2015-03-16 DIAGNOSIS — Z87891 Personal history of nicotine dependence: Secondary | ICD-10-CM

## 2015-03-16 DIAGNOSIS — Z794 Long term (current) use of insulin: Secondary | ICD-10-CM

## 2015-03-16 DIAGNOSIS — E785 Hyperlipidemia, unspecified: Secondary | ICD-10-CM | POA: Diagnosis not present

## 2015-03-16 DIAGNOSIS — Z7982 Long term (current) use of aspirin: Secondary | ICD-10-CM | POA: Diagnosis not present

## 2015-03-16 DIAGNOSIS — E538 Deficiency of other specified B group vitamins: Secondary | ICD-10-CM

## 2015-03-16 DIAGNOSIS — D61818 Other pancytopenia: Secondary | ICD-10-CM | POA: Diagnosis not present

## 2015-03-16 DIAGNOSIS — Z79899 Other long term (current) drug therapy: Secondary | ICD-10-CM | POA: Diagnosis not present

## 2015-03-16 DIAGNOSIS — R531 Weakness: Secondary | ICD-10-CM | POA: Diagnosis not present

## 2015-03-16 DIAGNOSIS — R5383 Other fatigue: Secondary | ICD-10-CM

## 2015-03-16 DIAGNOSIS — K5900 Constipation, unspecified: Secondary | ICD-10-CM | POA: Diagnosis not present

## 2015-03-16 DIAGNOSIS — R0602 Shortness of breath: Secondary | ICD-10-CM

## 2015-03-16 DIAGNOSIS — C9140 Hairy cell leukemia not having achieved remission: Secondary | ICD-10-CM

## 2015-03-16 DIAGNOSIS — K219 Gastro-esophageal reflux disease without esophagitis: Secondary | ICD-10-CM

## 2015-03-16 DIAGNOSIS — E119 Type 2 diabetes mellitus without complications: Secondary | ICD-10-CM | POA: Diagnosis not present

## 2015-03-16 DIAGNOSIS — K5790 Diverticulosis of intestine, part unspecified, without perforation or abscess without bleeding: Secondary | ICD-10-CM

## 2015-03-16 DIAGNOSIS — D649 Anemia, unspecified: Secondary | ICD-10-CM

## 2015-03-16 DIAGNOSIS — I10 Essential (primary) hypertension: Secondary | ICD-10-CM | POA: Diagnosis not present

## 2015-03-16 DIAGNOSIS — N400 Enlarged prostate without lower urinary tract symptoms: Secondary | ICD-10-CM

## 2015-03-16 DIAGNOSIS — Z85828 Personal history of other malignant neoplasm of skin: Secondary | ICD-10-CM | POA: Diagnosis not present

## 2015-03-16 DIAGNOSIS — Z8701 Personal history of pneumonia (recurrent): Secondary | ICD-10-CM

## 2015-03-16 LAB — CBC WITH DIFFERENTIAL/PLATELET
Basophils Absolute: 0 10*3/uL (ref 0–0.1)
Basophils Relative: 1 %
Eosinophils Absolute: 0.1 10*3/uL (ref 0–0.7)
Eosinophils Relative: 6 %
HCT: 25.7 % — ABNORMAL LOW (ref 40.0–52.0)
Hemoglobin: 8.5 g/dL — ABNORMAL LOW (ref 13.0–18.0)
Lymphocytes Relative: 24 %
Lymphs Abs: 0.2 10*3/uL — ABNORMAL LOW (ref 1.0–3.6)
MCH: 36 pg — ABNORMAL HIGH (ref 26.0–34.0)
MCHC: 33.3 g/dL (ref 32.0–36.0)
MCV: 108 fL — ABNORMAL HIGH (ref 80.0–100.0)
Monocytes Absolute: 0 10*3/uL — ABNORMAL LOW (ref 0.2–1.0)
Monocytes Relative: 3 %
Neutro Abs: 0.6 10*3/uL — ABNORMAL LOW (ref 1.4–6.5)
Neutrophils Relative %: 66 %
Platelets: 131 10*3/uL — ABNORMAL LOW (ref 150–440)
RBC: 2.38 MIL/uL — ABNORMAL LOW (ref 4.40–5.90)
RDW: 15.3 % — ABNORMAL HIGH (ref 11.5–14.5)
WBC: 0.9 10*3/uL — CL (ref 3.8–10.6)

## 2015-03-16 NOTE — Progress Notes (Signed)
Pt here today for labs and follow up; tolerated last treatment from July 15-22 well; offers no complaints today

## 2015-03-17 ENCOUNTER — Other Ambulatory Visit: Payer: Medicare Other

## 2015-03-17 ENCOUNTER — Other Ambulatory Visit: Payer: Self-pay | Admitting: *Deleted

## 2015-03-17 ENCOUNTER — Inpatient Hospital Stay: Payer: Medicare Other

## 2015-03-17 DIAGNOSIS — E119 Type 2 diabetes mellitus without complications: Secondary | ICD-10-CM | POA: Diagnosis not present

## 2015-03-17 DIAGNOSIS — Z87891 Personal history of nicotine dependence: Secondary | ICD-10-CM | POA: Diagnosis not present

## 2015-03-17 DIAGNOSIS — E785 Hyperlipidemia, unspecified: Secondary | ICD-10-CM | POA: Diagnosis not present

## 2015-03-17 DIAGNOSIS — C9140 Hairy cell leukemia not having achieved remission: Secondary | ICD-10-CM

## 2015-03-17 DIAGNOSIS — R5383 Other fatigue: Secondary | ICD-10-CM | POA: Diagnosis not present

## 2015-03-17 DIAGNOSIS — Z794 Long term (current) use of insulin: Secondary | ICD-10-CM | POA: Diagnosis not present

## 2015-03-17 DIAGNOSIS — I10 Essential (primary) hypertension: Secondary | ICD-10-CM | POA: Diagnosis not present

## 2015-03-17 DIAGNOSIS — K5900 Constipation, unspecified: Secondary | ICD-10-CM | POA: Diagnosis not present

## 2015-03-17 DIAGNOSIS — K219 Gastro-esophageal reflux disease without esophagitis: Secondary | ICD-10-CM | POA: Diagnosis not present

## 2015-03-17 DIAGNOSIS — Z79899 Other long term (current) drug therapy: Secondary | ICD-10-CM | POA: Diagnosis not present

## 2015-03-17 DIAGNOSIS — D61818 Other pancytopenia: Secondary | ICD-10-CM | POA: Diagnosis not present

## 2015-03-17 DIAGNOSIS — Z85828 Personal history of other malignant neoplasm of skin: Secondary | ICD-10-CM | POA: Diagnosis not present

## 2015-03-17 DIAGNOSIS — E538 Deficiency of other specified B group vitamins: Secondary | ICD-10-CM | POA: Diagnosis not present

## 2015-03-17 DIAGNOSIS — D649 Anemia, unspecified: Secondary | ICD-10-CM | POA: Diagnosis not present

## 2015-03-17 DIAGNOSIS — N400 Enlarged prostate without lower urinary tract symptoms: Secondary | ICD-10-CM | POA: Diagnosis not present

## 2015-03-17 DIAGNOSIS — Z7982 Long term (current) use of aspirin: Secondary | ICD-10-CM | POA: Diagnosis not present

## 2015-03-17 DIAGNOSIS — Z8701 Personal history of pneumonia (recurrent): Secondary | ICD-10-CM | POA: Diagnosis not present

## 2015-03-17 DIAGNOSIS — R531 Weakness: Secondary | ICD-10-CM | POA: Diagnosis not present

## 2015-03-17 DIAGNOSIS — K5790 Diverticulosis of intestine, part unspecified, without perforation or abscess without bleeding: Secondary | ICD-10-CM | POA: Diagnosis not present

## 2015-03-17 DIAGNOSIS — R0602 Shortness of breath: Secondary | ICD-10-CM | POA: Diagnosis not present

## 2015-03-17 LAB — ABO/RH: ABO/RH(D): A NEG

## 2015-03-19 LAB — PREPARE RBC (CROSSMATCH)

## 2015-03-20 ENCOUNTER — Inpatient Hospital Stay: Payer: Medicare Other | Attending: Hematology and Oncology

## 2015-03-20 ENCOUNTER — Other Ambulatory Visit: Payer: Self-pay

## 2015-03-20 DIAGNOSIS — K5790 Diverticulosis of intestine, part unspecified, without perforation or abscess without bleeding: Secondary | ICD-10-CM | POA: Insufficient documentation

## 2015-03-20 DIAGNOSIS — Z85828 Personal history of other malignant neoplasm of skin: Secondary | ICD-10-CM | POA: Insufficient documentation

## 2015-03-20 DIAGNOSIS — D61818 Other pancytopenia: Secondary | ICD-10-CM | POA: Insufficient documentation

## 2015-03-20 DIAGNOSIS — N400 Enlarged prostate without lower urinary tract symptoms: Secondary | ICD-10-CM | POA: Insufficient documentation

## 2015-03-20 DIAGNOSIS — C9140 Hairy cell leukemia not having achieved remission: Secondary | ICD-10-CM | POA: Insufficient documentation

## 2015-03-20 DIAGNOSIS — E785 Hyperlipidemia, unspecified: Secondary | ICD-10-CM | POA: Diagnosis not present

## 2015-03-20 DIAGNOSIS — R5383 Other fatigue: Secondary | ICD-10-CM | POA: Insufficient documentation

## 2015-03-20 DIAGNOSIS — Z86010 Personal history of colonic polyps: Secondary | ICD-10-CM | POA: Insufficient documentation

## 2015-03-20 DIAGNOSIS — R531 Weakness: Secondary | ICD-10-CM | POA: Diagnosis not present

## 2015-03-20 DIAGNOSIS — Z87891 Personal history of nicotine dependence: Secondary | ICD-10-CM | POA: Insufficient documentation

## 2015-03-20 DIAGNOSIS — K219 Gastro-esophageal reflux disease without esophagitis: Secondary | ICD-10-CM | POA: Diagnosis not present

## 2015-03-20 DIAGNOSIS — E119 Type 2 diabetes mellitus without complications: Secondary | ICD-10-CM | POA: Diagnosis not present

## 2015-03-20 DIAGNOSIS — Z8701 Personal history of pneumonia (recurrent): Secondary | ICD-10-CM | POA: Insufficient documentation

## 2015-03-20 DIAGNOSIS — E538 Deficiency of other specified B group vitamins: Secondary | ICD-10-CM | POA: Diagnosis not present

## 2015-03-20 DIAGNOSIS — R0602 Shortness of breath: Secondary | ICD-10-CM | POA: Insufficient documentation

## 2015-03-20 DIAGNOSIS — I10 Essential (primary) hypertension: Secondary | ICD-10-CM | POA: Insufficient documentation

## 2015-03-20 LAB — TYPE AND SCREEN
ABO/RH(D): A NEG
Antibody Screen: NEGATIVE
Unit division: 0
Unit division: 0

## 2015-03-20 LAB — CBC WITH DIFFERENTIAL/PLATELET
Basophils Absolute: 0 10*3/uL (ref 0–0.1)
Basophils Relative: 0 %
Eosinophils Absolute: 0.1 10*3/uL (ref 0–0.7)
Eosinophils Relative: 7 %
HCT: 24 % — ABNORMAL LOW (ref 40.0–52.0)
Hemoglobin: 8.1 g/dL — ABNORMAL LOW (ref 13.0–18.0)
Lymphocytes Relative: 19 %
Lymphs Abs: 0.2 10*3/uL — ABNORMAL LOW (ref 1.0–3.6)
MCH: 36.9 pg — ABNORMAL HIGH (ref 26.0–34.0)
MCHC: 34 g/dL (ref 32.0–36.0)
MCV: 108.7 fL — ABNORMAL HIGH (ref 80.0–100.0)
Monocytes Absolute: 0 10*3/uL — ABNORMAL LOW (ref 0.2–1.0)
Monocytes Relative: 5 %
Neutro Abs: 0.6 10*3/uL — ABNORMAL LOW (ref 1.4–6.5)
Neutrophils Relative %: 69 %
Platelets: 119 10*3/uL — ABNORMAL LOW (ref 150–440)
RBC: 2.21 MIL/uL — ABNORMAL LOW (ref 4.40–5.90)
RDW: 15.5 % — ABNORMAL HIGH (ref 11.5–14.5)
WBC: 0.9 10*3/uL — CL (ref 3.8–10.6)

## 2015-03-20 LAB — PREPARE RBC (CROSSMATCH)

## 2015-03-20 MED ORDER — HEPARIN SOD (PORK) LOCK FLUSH 100 UNIT/ML IV SOLN
500.0000 [IU] | Freq: Once | INTRAVENOUS | Status: AC
  Administered 2015-03-20: 500 [IU] via INTRAVENOUS

## 2015-03-20 MED ORDER — HEPARIN SOD (PORK) LOCK FLUSH 100 UNIT/ML IV SOLN
INTRAVENOUS | Status: AC
  Filled 2015-03-20: qty 5

## 2015-03-20 MED ORDER — SODIUM CHLORIDE 0.9 % IJ SOLN
10.0000 mL | INTRAMUSCULAR | Status: DC | PRN
  Administered 2015-03-20: 10 mL via INTRAVENOUS
  Filled 2015-03-20: qty 10

## 2015-03-20 NOTE — Progress Notes (Signed)
Alex Smith came in for lab (CBC with Diff) and "possible blood", according to scheduled appointment.  Hemoglobin today is 8.1, saw no parameters in Dr. Kem Parkinson orders for today's possible blood (she is on vacation this week).  Discussed with Blima Singer., Charge Nurse, who called Dr. Grayland Ormond (MD on call) and he said with a hemoglobin of 8.1 patient will not receive transfusion today, must be below 8.  Patient will return later in the week for follow up labs.  Called blood bank to inform them the patient would not receive blood today.

## 2015-03-23 ENCOUNTER — Inpatient Hospital Stay: Payer: Medicare Other

## 2015-03-23 ENCOUNTER — Other Ambulatory Visit: Payer: Self-pay

## 2015-03-23 ENCOUNTER — Other Ambulatory Visit: Payer: Medicare Other

## 2015-03-23 DIAGNOSIS — E119 Type 2 diabetes mellitus without complications: Secondary | ICD-10-CM | POA: Diagnosis not present

## 2015-03-23 DIAGNOSIS — C9140 Hairy cell leukemia not having achieved remission: Secondary | ICD-10-CM

## 2015-03-23 DIAGNOSIS — Z85828 Personal history of other malignant neoplasm of skin: Secondary | ICD-10-CM | POA: Diagnosis not present

## 2015-03-23 DIAGNOSIS — Z8701 Personal history of pneumonia (recurrent): Secondary | ICD-10-CM | POA: Diagnosis not present

## 2015-03-23 DIAGNOSIS — N400 Enlarged prostate without lower urinary tract symptoms: Secondary | ICD-10-CM | POA: Diagnosis not present

## 2015-03-23 DIAGNOSIS — E538 Deficiency of other specified B group vitamins: Secondary | ICD-10-CM | POA: Diagnosis not present

## 2015-03-23 DIAGNOSIS — K5790 Diverticulosis of intestine, part unspecified, without perforation or abscess without bleeding: Secondary | ICD-10-CM | POA: Diagnosis not present

## 2015-03-23 DIAGNOSIS — K219 Gastro-esophageal reflux disease without esophagitis: Secondary | ICD-10-CM | POA: Diagnosis not present

## 2015-03-23 DIAGNOSIS — Z87891 Personal history of nicotine dependence: Secondary | ICD-10-CM | POA: Diagnosis not present

## 2015-03-23 DIAGNOSIS — D61818 Other pancytopenia: Secondary | ICD-10-CM | POA: Diagnosis not present

## 2015-03-23 DIAGNOSIS — R531 Weakness: Secondary | ICD-10-CM | POA: Diagnosis not present

## 2015-03-23 DIAGNOSIS — E785 Hyperlipidemia, unspecified: Secondary | ICD-10-CM | POA: Diagnosis not present

## 2015-03-23 DIAGNOSIS — I10 Essential (primary) hypertension: Secondary | ICD-10-CM | POA: Diagnosis not present

## 2015-03-23 DIAGNOSIS — Z86010 Personal history of colonic polyps: Secondary | ICD-10-CM | POA: Diagnosis not present

## 2015-03-23 DIAGNOSIS — R0602 Shortness of breath: Secondary | ICD-10-CM | POA: Diagnosis not present

## 2015-03-23 DIAGNOSIS — R5383 Other fatigue: Secondary | ICD-10-CM | POA: Diagnosis not present

## 2015-03-23 LAB — CBC WITH DIFFERENTIAL/PLATELET
Basophils Absolute: 0 10*3/uL (ref 0–0.1)
Eosinophils Absolute: 0.1 10*3/uL (ref 0–0.7)
Eosinophils Relative: 7 %
HEMATOCRIT: 24.3 % — AB (ref 40.0–52.0)
Hemoglobin: 8.3 g/dL — ABNORMAL LOW (ref 13.0–18.0)
Lymphs Abs: 0.2 10*3/uL — ABNORMAL LOW (ref 1.0–3.6)
MCH: 37.1 pg — ABNORMAL HIGH (ref 26.0–34.0)
MCHC: 34.3 g/dL (ref 32.0–36.0)
MCV: 108.4 fL — ABNORMAL HIGH (ref 80.0–100.0)
Monocytes Absolute: 0.1 10*3/uL — ABNORMAL LOW (ref 0.2–1.0)
Monocytes Relative: 9 %
Neutro Abs: 0.5 10*3/uL — ABNORMAL LOW (ref 1.4–6.5)
PLATELETS: 137 10*3/uL — AB (ref 150–440)
RBC: 2.24 MIL/uL — ABNORMAL LOW (ref 4.40–5.90)
RDW: 16 % — ABNORMAL HIGH (ref 11.5–14.5)
WBC: 0.8 10*3/uL — CL (ref 3.8–10.6)

## 2015-03-23 LAB — SAMPLE TO BLOOD BANK

## 2015-03-24 ENCOUNTER — Inpatient Hospital Stay: Payer: Medicare Other

## 2015-03-27 ENCOUNTER — Encounter: Payer: Self-pay | Admitting: Hematology and Oncology

## 2015-03-27 ENCOUNTER — Other Ambulatory Visit: Payer: Self-pay

## 2015-03-27 DIAGNOSIS — C9140 Hairy cell leukemia not having achieved remission: Secondary | ICD-10-CM

## 2015-03-27 NOTE — Progress Notes (Signed)
[facility] day:  03/07/2015  Chief Complaint: Alex Smith is a 80 y.o. male with recurrent hairy cell leukemia who is seen for assessment on day 5 of cladribine.  HPI:  The patient was last seen in the medical oncology clinic on 03/03/2015.  At that time, cladribine via continuous infusion for 7 days was initiated.  Symptomatically, he has done well.  He states "I can't decide how to sleep" secondary to the pump.  He remains tired.  He is using Miralax for constipation. He denies any fevers.  Past Medical History  Diagnosis Date  . Hypertension   . GERD (gastroesophageal reflux disease)   . HLD (hyperlipidemia)   . History of pneumonia 2000's    "once" (07/07/2012)  . Type II diabetes mellitus   . Hairy cell leukemia 2006    recurrent, seizure on rituxan, now on cladribine ([facility])  . Basal cell carcinoma of face   . Diverticulosis   . Colon polyps   . BPH (benign prostatic hypertrophy)     followed by urology, discharged (Dr. Bernardo Heater)  . B12 deficiency   . History of shingles   . Shortness of breath dyspnea   . Pneumonia     Past Surgical History  Procedure Laterality Date  . Eye surgery Left 06/2012    laser surgery  . Cataract extraction w/ intraocular lens  implant, bilateral  ~ 2010  . Skin cancer excision      "all over my face" (07/07/2012)  . 25 gauge pars plana vitrectomy with 20 gauge mvr port for macular hole  07/07/2012    Procedure: 25 GAUGE PARS PLANA VITRECTOMY WITH 20 GAUGE MVR PORT FOR MACULAR HOLE;  Surgeon: Hayden Pedro, MD;  Location: [facility];  Service: Ophthalmology;  Laterality: Left;  . Serum patch  07/07/2012    Procedure: SERUM PATCH;  Surgeon: Hayden Pedro, MD;  Location: [facility];  Service: Ophthalmology;  Laterality: Left;  . Gas insertion  07/07/2012    Procedure: INSERTION OF GAS;  Surgeon: Hayden Pedro, MD;  Location: [facility];  Service: Ophthalmology;  Laterality: Left;  C3F8  .  Cardiovascular stress test  2013    treadmill - no evidence ischemia, EF 61%  . Colonoscopy  2014    [facility] WNL no rpt needed, h/o polyps  . Peripheral vascular catheterization N/A 02/23/2015    Procedure: Glori Luis Cath Insertion;  Surgeon: Algernon Huxley, MD;  Location: [facility];  Service: Cardiovascular;  Laterality: N/A;    Family History  Problem Relation Age of Onset  . Dementia Mother   . Heart failure Father 107  . Cancer Sister     breast  . Diabetes Paternal Uncle   . Diabetes Paternal Aunt   . CAD Brother 60    MI  . Stroke Neg Hx     Social History:  reports that he quit smoking about 46 years ago. His smoking use included Cigarettes. He has a 50 pack-year smoking history. He has never used smokeless tobacco. He reports that he drinks alcohol. He reports that he does not use illicit drugs.  He works at [employer] 20 hours/week.  The patient is accompanied by his wife and oldest son.  Allergies:  Allergies  Allergen Reactions  . Rituximab Rash    Chest tightness  . Blood-Group Specific Substance Other (See Comments)    Had a post transfusion reaction of red blood cells; NOW REQUIRES WASHED  BLOOD CELLS  . Primaxin [Imipenem] Other (See Comments)    Possible allergy  . Voriconazole Other (See Comments)  . Sulfa Antibiotics Itching and Rash  Possible allergy to Zoloft, allopurinol, and a chemotherapy medication.  He believes he tolerates dapsone.  Current Medications: Current Outpatient Prescriptions  Medication Sig Dispense Refill  . aspirin EC 81 MG tablet Take 81 mg by mouth daily.    Marland Kitchen glucose blood (ACCU-CHEK AVIVA PLUS) test strip Use to check sugar twice daily Dx:E11.9 200 each 3  . hydrochlorothiazide (HYDRODIURIL) 25 MG tablet Take 1 tablet (25 mg total) by mouth daily.    . insulin NPH Human (HUMULIN N) 100 UNIT/ML injection Inject 0.35 mLs (35 Units total) into the skin 2 (two) times daily before a meal. 20 mL 11  . insulin regular (HUMULIN R) 100  units/mL injection Inject 0.1 mLs (10 Units total) into the skin 2 (two) times daily before a meal. 10 mL 11  . lidocaine-prilocaine (EMLA) cream Apply 1 application topically as needed. 30 g 3  . lisinopril (PRINIVIL,ZESTRIL) 20 MG tablet TAKE ONE TABLET BY MOUTH TWICE DAILY 60 tablet 6  . metFORMIN (GLUCOPHAGE) 1000 MG tablet TAKE ONE TABLET BY MOUTH TWICE DAILY AS DIRECTED 180 tablet 3  . omeprazole (PRILOSEC) 20 MG capsule Take 40 mg by mouth daily.     . ondansetron (ZOFRAN) 8 MG tablet Take 1 tablet (8 mg total) by mouth every 8 (eight) hours as needed for nausea or vomiting. 20 tablet 0  . pravastatin (PRAVACHOL) 20 MG tablet TAKE ONE TABLET AT BEDTIME 30 tablet 6  . tamsulosin (FLOMAX) 0.4 MG CAPS capsule Take 0.4 mg by mouth daily.    . temazepam (RESTORIL) 7.5 MG capsule Take 1 capsule (7.5 mg total) by mouth at bedtime as needed for sleep (insomnia). 30 capsule 0  . vitamin B-12 (CYANOCOBALAMIN) 1000 MCG tablet Take 1,000 mcg by mouth daily.    . vitamin E 400 UNIT capsule Take 200 Units by mouth daily.     Marland Kitchen acyclovir (ZOVIRAX) 400 MG tablet Take 1 tablet (400 mg total) by mouth 2 (two) times daily. For leukemia 60 tablet 1  . acyclovir (ZOVIRAX) 400 MG tablet Take by mouth.    . dapsone 100 MG tablet Take by mouth.    . voriconazole (VFEND) 200 MG tablet Take by mouth.     No current facility-administered medications for this visit.   Facility-Administered Medications Ordered in Other Visits  Medication Dose Route Frequency Provider Last Rate Last Dose  . heparin lock flush 100 unit/mL  500 Units Intravenous Once Lequita Asal, MD      . sodium chloride 0.9 % injection 10 mL  10 mL Intravenous PRN Lequita Asal, MD   10 mL at 03/03/15 0903  . sodium chloride 0.9 % injection 10 mL  10 mL Intracatheter PRN Lequita Asal, MD   10 mL at 03/10/15 1410   Review of Systems:  GENERAL:  Fatigue.  No fevers or sweats.  Weight stable. PERFORMANCE STATUS (ECOG):  1-2 HEENT:   No visual changes, runny nose, sore throat. Pulmonary:  No shortness of breath.  No cough.  No hemoptysis. Cardiac:  No chest pain, palpitations, orthopnea, or PND. GI:  Using Miralax for constipation.  No nausea, vomiting, diarrhea, melena or hematochezia. GU:  No urgency, frequency, dysuria, or hematuria. Musculoskeletal:  No back pain.  No joint pain.  No muscle tenderness. Extremities:  No pain or swelling. Skin:  No rashes  or skin changes. Neuro:  No headache, numbness or weakness, balance or coordination issues. Endocrine:  Diabetes.  No thyroid issues, hot flashes or night sweats. Psych:  Trouble sleeping.  No mood changes, depression or anxiety. Pain:  No focal pain. Review of systems:  All other systems reviewed and found to be negative.   Physical Exam: Blood pressure 164/72, pulse 91, temperature 96 F (35.6 C), temperature source Tympanic, resp. rate 20, weight 223 lb 1.7 oz (101.2 kg), SpO2 96 %.  GENERAL:  Well developed, well nourished, sitting comfortably in the exam room in no acute distress. MENTAL STATUS:  Alert and oriented to person, place and time. HEAD:  Pearline Cables hair.  Normocephalic, atraumatic, face symmetric, no Cushingoid features. EYES:  Dark blue eyes.  Pupils equal round and reactive to light and accomodation.  No conjunctivitis or scleral icterus. ENT:  Oropharynx clear without lesion.  Tongue normal. Mucous membranes moist.  CHEST:  Unremarkable port-a-cath site. RESPIRATORY:  Clear to auscultation without rales, wheezes or rhonchi. CARDIOVASCULAR:  Regular rate and rhythm without murmur, rub or gallop. ABDOMEN:  Soft, non-tender, with active bowel sounds, and no hepatosplenomegaly.  No masses. SKIN:  No rashes, ulcers or lesions. EXTREMITIES: No edema, no skin discoloration or tenderness.  No palpable cords. LYMPH NODES: No palpable cervical, supraclavicular, axillary or inguinal adenopathy  NEUROLOGICAL: Unremarkable. PSYCH:  Appropriate.   No visits  with results within 3 Day(s) from this visit. Latest known visit with results is:  Appointment on 03/03/2015  Component Date Value Ref Range Status  . Sodium 03/03/2015 137  135 - 145 mmol/L Final  . Potassium 03/03/2015 4.3  3.5 - 5.1 mmol/L Final  . Chloride 03/03/2015 104  101 - 111 mmol/L Final  . CO2 03/03/2015 25  22 - 32 mmol/L Final  . Glucose, Bld 03/03/2015 302* 65 - 99 mg/dL Final  . BUN 03/03/2015 25* 6 - 20 mg/dL Final  . Creatinine, Ser 03/03/2015 1.23  0.61 - 1.24 mg/dL Final  . Calcium 03/03/2015 8.4* 8.9 - 10.3 mg/dL Final  . Total Protein 03/03/2015 7.1  6.5 - 8.1 g/dL Final  . Albumin 03/03/2015 3.8  3.5 - 5.0 g/dL Final  . AST 03/03/2015 22  15 - 41 U/L Final  . ALT 03/03/2015 15* 17 - 63 U/L Final  . Alkaline Phosphatase 03/03/2015 51  38 - 126 U/L Final  . Total Bilirubin 03/03/2015 0.5  0.3 - 1.2 mg/dL Final  . GFR calc non Af Amer 03/03/2015 54* >60 mL/min Final  . GFR calc Af Amer 03/03/2015 >60  >60 mL/min Final   Comment: (NOTE) The eGFR has been calculated using the CKD EPI equation. This calculation has not been validated in all clinical situations. eGFR's persistently <60 mL/min signify possible Chronic Kidney Disease.   . Anion gap 03/03/2015 8  5 - 15 Final  . WBC 03/03/2015 1.7* 3.8 - 10.6 K/uL Final  . RBC 03/03/2015 2.58* 4.40 - 5.90 MIL/uL Final  . Hemoglobin 03/03/2015 9.4* 13.0 - 18.0 g/dL Final  . HCT 03/03/2015 28.0* 40.0 - 52.0 % Final  . MCV 03/03/2015 108.4* 80.0 - 100.0 fL Final  . MCH 03/03/2015 36.5* 26.0 - 34.0 pg Final  . MCHC 03/03/2015 33.7  32.0 - 36.0 g/dL Final  . RDW 03/03/2015 15.6* 11.5 - 14.5 % Final  . Platelets 03/03/2015 116* 150 - 440 K/uL Final  . Neutrophils Relative % 03/03/2015 46   Final  . Neutro Abs 03/03/2015 0.8* 1.4 - 6.5 K/uL Final  .  Lymphocytes Relative 03/03/2015 47   Final  . Lymphs Abs 03/03/2015 0.8* 1.0 - 3.6 K/uL Final  . Monocytes Relative 03/03/2015 2   Final  . Monocytes Absolute 03/03/2015  0.0* 0.2 - 1.0 K/uL Final  . Eosinophils Relative 03/03/2015 4   Final  . Eosinophils Absolute 03/03/2015 0.1  0 - 0.7 K/uL Final  . Basophils Relative 03/03/2015 1   Final  . Basophils Absolute 03/03/2015 0.0  0 - 0.1 K/uL Final    Assessment:  Alex Smith is a 80 y.o. male with recurrent hairy cell leukemia.  He was diagnosed in 2006.  He received cladribine 11/06-11/05/2005.  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  Bone marrow aspirate and biopsy on 02/07/2015 revealed a extensive marrow involvement by recurrent hairy cell leukemia (approximately 80% of cells in the core). There was a monoclonal plasma cell infiltrate (approximately 10%), compatible with a plasma cell neoplasm. Marrow was hypercellular (40-50%) with markedly diminished residual trilineage hematopoiesis and mild multilineage dyspoiesis.  Peripheral smear revealed leukopenia (WBC 1900) with 2% atypical lymphoid cells compatible with hairy cell leukemia. There was moderate anemia with anisopoikilocytosis. 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  FISH studies for MDS were negative.  Cytogenetics were normal (46, XY).  He has an unclear allergy history.  He may be allergic to Primaxin, voriconazole, Zoloft, allopurinol, and a chemotherapy medication.  Reaction occurred in 06/2005.  He is allergic to sulfa.  He is not allergic to dapsone.  He requires washed RBCs.  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  He completed a course of Levaquin for pneumonia.  He is currently day 5 of cladribine (began 03/03/2015).  Symptomatically, he is fatigued.  Exam is stable.  Plan: 1. Disconnect pump on Friday, 03/10/2015. 2. Rx: acyclovir. 3. Contact Dr. Ola Spurr for prophylactic antibiotic management secondary to multiple allergies. 4. Review fever and neutropenia precautions. 5. Check labs on Tuesdays and Fridays  (CBC with diff). 6. RTC in 1 week for MD assessment and labs (CBC with diff, type and screen).   Lequita Asal, MD  03/07/2015

## 2015-03-27 NOTE — Progress Notes (Signed)
[facility] day:  03/16/2015  Chief Complaint: Alex Smith is a 80 y.o. male with recurrent hairy cell leukemia who is seen for assessment on day 14 of cladribine.  HPI:  The patient was last seen in the medical oncology clinic on 03/07/2015.  At that time, he was in the midst of his 7 day cladribine continuous infusion.  Symptomatically, he had trouble sleeping because of the pump.  He denied any fevers.  During the interim, he has done well.    He saw Dr. Ola Spurr who added dapsone to his acyclovir.  He is awaiting coverage for posaconazole or voriconazole.  He feels a little fatigued and short of breath.  Past Medical History  Diagnosis Date  . Hypertension   . GERD (gastroesophageal reflux disease)   . HLD (hyperlipidemia)   . History of pneumonia 2000's    "once" (07/07/2012)  . Type II diabetes mellitus   . Hairy cell leukemia 2006    recurrent, seizure on rituxan, now on cladribine ([facility])  . Basal cell carcinoma of face   . Diverticulosis   . Colon polyps   . BPH (benign prostatic hypertrophy)     followed by urology, discharged (Dr. Bernardo Heater)  . B12 deficiency   . History of shingles   . Shortness of breath dyspnea   . Pneumonia     Past Surgical History  Procedure Laterality Date  . Eye surgery Left 06/2012    laser surgery  . Cataract extraction w/ intraocular lens  implant, bilateral  ~ 2010  . Skin cancer excision      "all over my face" (07/07/2012)  . 25 gauge pars plana vitrectomy with 20 gauge mvr port for macular hole  07/07/2012    Procedure: 25 GAUGE PARS PLANA VITRECTOMY WITH 20 GAUGE MVR PORT FOR MACULAR HOLE;  Surgeon: Hayden Pedro, MD;  Location: [facility];  Service: Ophthalmology;  Laterality: Left;  . Serum patch  07/07/2012    Procedure: SERUM PATCH;  Surgeon: Hayden Pedro, MD;  Location: [facility];  Service: Ophthalmology;  Laterality: Left;  . Gas insertion  07/07/2012    Procedure: INSERTION OF  GAS;  Surgeon: Hayden Pedro, MD;  Location: [facility];  Service: Ophthalmology;  Laterality: Left;  C3F8  . Cardiovascular stress test  2013    treadmill - no evidence ischemia, EF 61%  . Colonoscopy  2014    [facility] WNL no rpt needed, h/o polyps  . Peripheral vascular catheterization N/A 02/23/2015    Procedure: Glori Luis Cath Insertion;  Surgeon: Algernon Huxley, MD;  Location: [facility];  Service: Cardiovascular;  Laterality: N/A;    Family History  Problem Relation Age of Onset  . Dementia Mother   . Heart failure Father 31  . Cancer Sister     breast  . Diabetes Paternal Uncle   . Diabetes Paternal Aunt   . CAD Brother 62    MI  . Stroke Neg Hx     Social History:  reports that he quit smoking about 46 years ago. His smoking use included Cigarettes. He has a 50 pack-year smoking history. He has never used smokeless tobacco. He reports that he drinks alcohol. He reports that he does not use illicit drugs.  He works at [employer] 20 hours/week.  The patient is alone today.  Allergies:  Allergies  Allergen Reactions  . Rituximab Rash    Chest tightness  . Blood-Group Specific  Substance Other (See Comments)    Had a post transfusion reaction of red blood cells; NOW REQUIRES WASHED BLOOD CELLS  . Primaxin [Imipenem] Other (See Comments)    Possible allergy  . Voriconazole Other (See Comments)  . Sulfa Antibiotics Itching and Rash  Possible allergy to Zoloft, allopurinol, and a chemotherapy medication.  He tolerates Dapsone.  Current Medications: Current Outpatient Prescriptions  Medication Sig Dispense Refill  . acyclovir (ZOVIRAX) 400 MG tablet Take 1 tablet (400 mg total) by mouth 2 (two) times daily. For leukemia 60 tablet 1  . acyclovir (ZOVIRAX) 400 MG tablet Take by mouth.    Marland Kitchen aspirin EC 81 MG tablet Take 81 mg by mouth daily.    . dapsone 100 MG tablet Take by mouth.    Marland Kitchen glucose blood (ACCU-CHEK AVIVA PLUS) test strip Use to check sugar twice daily Dx:E11.9 200  each 3  . hydrochlorothiazide (HYDRODIURIL) 25 MG tablet Take 1 tablet (25 mg total) by mouth daily.    . insulin NPH Human (HUMULIN N) 100 UNIT/ML injection Inject 0.35 mLs (35 Units total) into the skin 2 (two) times daily before a meal. 20 mL 11  . insulin regular (HUMULIN R) 100 units/mL injection Inject 0.1 mLs (10 Units total) into the skin 2 (two) times daily before a meal. 10 mL 11  . lidocaine-prilocaine (EMLA) cream Apply 1 application topically as needed. 30 g 3  . lisinopril (PRINIVIL,ZESTRIL) 20 MG tablet TAKE ONE TABLET BY MOUTH TWICE DAILY 60 tablet 6  . metFORMIN (GLUCOPHAGE) 1000 MG tablet TAKE ONE TABLET BY MOUTH TWICE DAILY AS DIRECTED 180 tablet 3  . omeprazole (PRILOSEC) 20 MG capsule Take 40 mg by mouth daily.     . ondansetron (ZOFRAN) 8 MG tablet Take 1 tablet (8 mg total) by mouth every 8 (eight) hours as needed for nausea or vomiting. 20 tablet 0  . pravastatin (PRAVACHOL) 20 MG tablet TAKE ONE TABLET AT BEDTIME 30 tablet 6  . tamsulosin (FLOMAX) 0.4 MG CAPS capsule Take 0.4 mg by mouth daily.    . vitamin B-12 (CYANOCOBALAMIN) 1000 MCG tablet Take 1,000 mcg by mouth daily.    . vitamin E 400 UNIT capsule Take 200 Units by mouth daily.     Marland Kitchen voriconazole (VFEND) 200 MG tablet Take by mouth.    . temazepam (RESTORIL) 7.5 MG capsule Take 1 capsule (7.5 mg total) by mouth at bedtime as needed for sleep (insomnia). 30 capsule 0   No current facility-administered medications for this visit.   Facility-Administered Medications Ordered in Other Visits  Medication Dose Route Frequency Provider Last Rate Last Dose  . heparin lock flush 100 unit/mL  500 Units Intravenous Once Lequita Asal, MD      . sodium chloride 0.9 % injection 10 mL  10 mL Intravenous PRN Lequita Asal, MD   10 mL at 03/03/15 0903  . sodium chloride 0.9 % injection 10 mL  10 mL Intracatheter PRN Lequita Asal, MD   10 mL at 03/10/15 1410   Review of Systems:  GENERAL:  Fatigue.  No fevers  or sweats.  Weight stable. PERFORMANCE STATUS (ECOG):  1-2 HEENT:  No visual changes, runny nose, sore throat. Pulmonary:  Little shortness of breath.  No cough.  No hemoptysis. Cardiac:  No chest pain, palpitations, orthopnea, or PND. GI:  Using Miralax for constipation.  No nausea, vomiting, diarrhea, melena or hematochezia. GU:  No urgency, frequency, dysuria, or hematuria. Musculoskeletal:  No back pain.  No joint pain.  No muscle tenderness. Extremities:  No pain or swelling. Skin:  No rashes or skin changes. Neuro:  No headache, numbness or weakness, balance or coordination issues. Endocrine:  Diabetes.  No thyroid issues, hot flashes or night sweats. Psych:  No mood changes, depression or anxiety. Pain:  No focal pain. Review of systems:  All other systems reviewed and found to be negative.   Physical Exam: Blood pressure 166/78, pulse 94, temperature 97.5 F (36.4 C), temperature source Tympanic, height _0  (1.727 m), weight 219 lb 12.8 oz (99.7 kg).  GENERAL:  Well developed, well nourished, sitting comfortably in the exam room in no acute distress. MENTAL STATUS:  Alert and oriented to person, place and time. HEAD:  Pearline Cables hair.  Normocephalic, atraumatic, face symmetric, no Cushingoid features. EYES:  Dark blue eyes.  Pupils equal round and reactive to light and accomodation.  No conjunctivitis or scleral icterus. ENT:  Oropharynx clear without lesion.  Tongue normal. Mucous membranes moist.  RESPIRATORY:  Clear to auscultation without rales, wheezes or rhonchi. CARDIOVASCULAR:  Regular rate and rhythm without murmur, rub or gallop. ABDOMEN:  Soft, non-tender, with active bowel sounds, and no hepatosplenomegaly.  No masses. SKIN:  Abdominal bruises secondary to injections.  No rashes, ulcers or lesions. EXTREMITIES: No edema, no skin discoloration or tenderness.  No palpable cords. LYMPH NODES: No palpable cervical, supraclavicular, axillary or inguinal adenopathy   NEUROLOGICAL: Unremarkable. PSYCH:  Appropriate.   Appointment on 03/16/2015  Component Date Value Ref Range Status  . WBC 03/16/2015 0.9* 3.8 - 10.6 K/uL Final   Comment: RESULT REPEATED AND VERIFIED [facility]ER CENTER CRITICAL VALUE PROTOCOL   . RBC 03/16/2015 2.38* 4.40 - 5.90 MIL/uL Final  . Hemoglobin 03/16/2015 8.5* 13.0 - 18.0 g/dL Final  . HCT 03/16/2015 25.7* 40.0 - 52.0 % Final  . MCV 03/16/2015 108.0* 80.0 - 100.0 fL Final  . MCH 03/16/2015 36.0* 26.0 - 34.0 pg Final  . MCHC 03/16/2015 33.3  32.0 - 36.0 g/dL Final  . RDW 03/16/2015 15.3* 11.5 - 14.5 % Final  . Platelets 03/16/2015 131* 150 - 440 K/uL Final  . Neutrophils Relative % 03/16/2015 66%   Final  . Neutro Abs 03/16/2015 0.6* 1.4 - 6.5 K/uL Final  . Lymphocytes Relative 03/16/2015 24%   Final  . Lymphs Abs 03/16/2015 0.2* 1.0 - 3.6 K/uL Final  . Monocytes Relative 03/16/2015 3%   Final  . Monocytes Absolute 03/16/2015 0.0* 0.2 - 1.0 K/uL Final  . Eosinophils Relative 03/16/2015 6%   Final  . Eosinophils Absolute 03/16/2015 0.1  0 - 0.7 K/uL Final  . Basophils Relative 03/16/2015 1%   Final  . Basophils Absolute 03/16/2015 0.0  0 - 0.1 K/uL Final    Assessment:  Alex Smith is a 80 y.o. male with recurrent hairy cell leukemia.  He was diagnosed with hairy cell leukemia in 2006.  He received cladribine 11/06-11/05/2005.  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  Bone marrow aspirate and biopsy on 02/07/2015 revealed a extensive marrow involvement by recurrent hairy cell leukemia (approximately 80% of cells in the core). There was a monoclonal plasma cell infiltrate (approximately 10%), compatible with a plasma cell neoplasm. Marrow was hypercellular (40-50%) with markedly diminished residual trilineage hematopoiesis and mild multilineage dyspoiesis.  Peripheral smear revealed leukopenia (WBC 1900) with 2% atypical lymphoid cells compatible with hairy cell leukemia. There was  moderate anemia with anisopoikilocytosis. FISH studies revealed an abnormal myeloma  panel with CCND1/IGH translocation t (11;14) and loss of MAF/16q.  FISH studies for MDS were negative.  Cytogenetics were normal (46, XY).  He has an unclear allergy history.  He may be allergic to Primaxin, voriconazole, Zoloft, allopurinol, and a chemotherapy medication.  Reaction occurred in 06/2005.  He is allergic to sulfa.  He is not allergic to dapsone.  He requires washed RBCs.  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  He completed a course of Levaquin for pneumonia.  He is on prophylactic Acyclovir and dapsone.  He is awaiting coverage for posaconazole versus voriconazole.  He is currently day 14 status post cladribine (began 03/03/2015).  Symptomatically, he is fatigued and a little short of breath.  Exam is stable.  White count has decreased from 1700 to 900 (ANC 800 to 600).  Hematocrit is 25.7 and platelets are 131,000.  Plan: 1. Labs today:  CBC with diff. 2. Discuss anemia and criteria for transfusion. 3. Ensure all PRBC transfusions are leukopoor, irradiated, and washed. 4. Review fever and neutropenia precautions. 5. RTC tomorrow for labs (CMP and type and screen). 6. RTC every Monday and Thursday for labs (CBC with diff).  Type and screen on Mondays. 7. RTC on 03/28/2015 for MD assessment and labs (CBC with diff, CMP, type and screen).   Lequita Asal, MD  03/16/2015

## 2015-03-28 ENCOUNTER — Inpatient Hospital Stay (HOSPITAL_BASED_OUTPATIENT_CLINIC_OR_DEPARTMENT_OTHER): Payer: Medicare Other | Admitting: Hematology and Oncology

## 2015-03-28 ENCOUNTER — Inpatient Hospital Stay: Payer: Medicare Other

## 2015-03-28 VITALS — BP 153/78 | HR 97 | Temp 97.0°F | Resp 20 | Wt 219.6 lb

## 2015-03-28 DIAGNOSIS — K219 Gastro-esophageal reflux disease without esophagitis: Secondary | ICD-10-CM

## 2015-03-28 DIAGNOSIS — R531 Weakness: Secondary | ICD-10-CM | POA: Diagnosis not present

## 2015-03-28 DIAGNOSIS — Z8701 Personal history of pneumonia (recurrent): Secondary | ICD-10-CM | POA: Diagnosis not present

## 2015-03-28 DIAGNOSIS — R5383 Other fatigue: Secondary | ICD-10-CM

## 2015-03-28 DIAGNOSIS — E785 Hyperlipidemia, unspecified: Secondary | ICD-10-CM | POA: Diagnosis not present

## 2015-03-28 DIAGNOSIS — D61818 Other pancytopenia: Secondary | ICD-10-CM

## 2015-03-28 DIAGNOSIS — C9140 Hairy cell leukemia not having achieved remission: Secondary | ICD-10-CM

## 2015-03-28 DIAGNOSIS — E119 Type 2 diabetes mellitus without complications: Secondary | ICD-10-CM

## 2015-03-28 DIAGNOSIS — K5790 Diverticulosis of intestine, part unspecified, without perforation or abscess without bleeding: Secondary | ICD-10-CM

## 2015-03-28 DIAGNOSIS — Z87891 Personal history of nicotine dependence: Secondary | ICD-10-CM | POA: Diagnosis not present

## 2015-03-28 DIAGNOSIS — E538 Deficiency of other specified B group vitamins: Secondary | ICD-10-CM

## 2015-03-28 DIAGNOSIS — Z86010 Personal history of colonic polyps: Secondary | ICD-10-CM

## 2015-03-28 DIAGNOSIS — R0602 Shortness of breath: Secondary | ICD-10-CM | POA: Diagnosis not present

## 2015-03-28 DIAGNOSIS — Z85828 Personal history of other malignant neoplasm of skin: Secondary | ICD-10-CM | POA: Diagnosis not present

## 2015-03-28 DIAGNOSIS — I10 Essential (primary) hypertension: Secondary | ICD-10-CM

## 2015-03-28 DIAGNOSIS — N400 Enlarged prostate without lower urinary tract symptoms: Secondary | ICD-10-CM

## 2015-03-28 LAB — CBC WITH DIFFERENTIAL/PLATELET
Basophils Absolute: 0 10*3/uL (ref 0–0.1)
Basophils Relative: 2 %
Eosinophils Absolute: 0.1 10*3/uL (ref 0–0.7)
Eosinophils Relative: 7 %
HCT: 24.2 % — ABNORMAL LOW (ref 40.0–52.0)
Hemoglobin: 8.3 g/dL — ABNORMAL LOW (ref 13.0–18.0)
Lymphocytes Relative: 16 %
Lymphs Abs: 0.2 10*3/uL — ABNORMAL LOW (ref 1.0–3.6)
MCH: 37.3 pg — ABNORMAL HIGH (ref 26.0–34.0)
MCHC: 34.2 g/dL (ref 32.0–36.0)
MCV: 109.1 fL — ABNORMAL HIGH (ref 80.0–100.0)
Monocytes Absolute: 0.2 10*3/uL (ref 0.2–1.0)
Monocytes Relative: 12 %
Neutro Abs: 0.8 10*3/uL — ABNORMAL LOW (ref 1.4–6.5)
Neutrophils Relative %: 63 %
Platelets: 122 10*3/uL — ABNORMAL LOW (ref 150–440)
RBC: 2.22 MIL/uL — ABNORMAL LOW (ref 4.40–5.90)
RDW: 16.6 % — ABNORMAL HIGH (ref 11.5–14.5)
WBC: 1.3 10*3/uL — CL (ref 3.8–10.6)

## 2015-03-28 LAB — COMPREHENSIVE METABOLIC PANEL
ALT: 27 U/L (ref 17–63)
AST: 44 U/L — ABNORMAL HIGH (ref 15–41)
Albumin: 4 g/dL (ref 3.5–5.0)
Alkaline Phosphatase: 60 U/L (ref 38–126)
Anion gap: 4 — ABNORMAL LOW (ref 5–15)
BUN: 31 mg/dL — ABNORMAL HIGH (ref 6–20)
CO2: 25 mmol/L (ref 22–32)
Calcium: 7.9 mg/dL — ABNORMAL LOW (ref 8.9–10.3)
Chloride: 102 mmol/L (ref 101–111)
Creatinine, Ser: 1.5 mg/dL — ABNORMAL HIGH (ref 0.61–1.24)
GFR calc Af Amer: 50 mL/min — ABNORMAL LOW (ref >60)
GFR calc non Af Amer: 43 mL/min — ABNORMAL LOW (ref >60)
Glucose, Bld: 231 mg/dL — ABNORMAL HIGH (ref 65–99)
Potassium: 4.6 mmol/L (ref 3.5–5.1)
Sodium: 131 mmol/L — ABNORMAL LOW (ref 135–145)
Total Bilirubin: 0.6 mg/dL (ref 0.3–1.2)
Total Protein: 7.5 g/dL (ref 6.5–8.1)

## 2015-03-28 LAB — SAMPLE TO BLOOD BANK

## 2015-03-28 NOTE — Progress Notes (Signed)
Patient here today for follow up for Hairy Cell Leukemia. States he has been doing well, reports "decreased energy level" and slight SOB on exertion. Denies Pain or bleeding.

## 2015-03-28 NOTE — Progress Notes (Signed)
[facility] day:  03/28/2015   Chief Complaint: Alex Smith is a 80 y.o. male with recurrent hairy cell leukemia who is seen for assessment on day 26 of cladribine.  HPI:  The patient was last seen in the medical oncology clinic on 03/16/2015.  At that time, he was day 14 s/p cladribine.  He was doing well except for a little fatigue and shortness of breath.  Exam was stable.  White count had decreased from 1700 to 900 (ANC 800 to 600).  Hematocrit was 25.7 and platelets 131,000.  He was set up for transfusion for symptomatic anemia.  He did not receive his transfusion.  During the interim, he notes no energy.  He feels like he is "looking at 4 walls".  He denies any fevers or infections. He is having trouble paying for his voriconazole.  Past Medical History  Diagnosis Date  . Hypertension   . GERD (gastroesophageal reflux disease)   . HLD (hyperlipidemia)   . History of pneumonia 2000's    "once" (07/07/2012)  . Type II diabetes mellitus   . Hairy cell leukemia 2006    recurrent, seizure on rituxan, now on cladribine ([place])  . Basal cell carcinoma of face   . Diverticulosis   . Colon polyps   . BPH (benign prostatic hypertrophy)     followed by urology, discharged (Dr. Bernardo Heater)  . B12 deficiency   . History of shingles   . Shortness of breath dyspnea   . Pneumonia     Past Surgical History  Procedure Laterality Date  . Eye surgery Left 06/2012    laser surgery  . Cataract extraction w/ intraocular lens  implant, bilateral  ~ 2010  . Skin cancer excision      "all over my face" (07/07/2012)  . 25 gauge pars plana vitrectomy with 20 gauge mvr port for macular hole  07/07/2012    Procedure: 25 GAUGE PARS PLANA VITRECTOMY WITH 20 GAUGE MVR PORT FOR MACULAR HOLE;  Surgeon: Hayden Pedro, MD;  Location: [facility];  Service: Ophthalmology;  Laterality: Left;  . Serum patch  07/07/2012    Procedure: SERUM PATCH;  Surgeon: Hayden Pedro, MD;  Location: [facility];  Service: Ophthalmology;  Laterality: Left;  . Gas insertion  07/07/2012    Procedure: INSERTION OF GAS;  Surgeon: Hayden Pedro, MD;  Location: [facility];  Service: Ophthalmology;  Laterality: Left;  [id number]  . Cardiovascular stress test  2013    treadmill - no evidence ischemia, EF 61%  . Colonoscopy  2014    [facility] WNL no rpt needed, h/o polyps  . Peripheral vascular catheterization N/A 02/23/2015    Procedure: Glori Luis Cath Insertion;  Surgeon: Algernon Huxley, MD;  Location: [facility];  Service: Cardiovascular;  Laterality: N/A;    Family History  Problem Relation Age of Onset  . Dementia Mother   . Heart failure Father 38  . Cancer Sister     breast  . Diabetes Paternal Uncle   . Diabetes Paternal Aunt   . CAD Brother 56    MI  . Stroke Neg Hx     Social History:  reports that he quit smoking about 46 years ago. His smoking use included Cigarettes. He has a 50 pack-year smoking history. He has never used smokeless tobacco. He reports that he drinks alcohol. He reports that he does not use illicit drugs.  He works at [employer]  20 hours/week.  The patient is accompanied by his wife today.  Allergies:  Allergies  Allergen Reactions  . Rituximab Rash    Chest tightness  . Blood-Group Specific Substance Other (See Comments)    Had a post transfusion reaction of red blood cells; NOW REQUIRES WASHED BLOOD CELLS  . Primaxin [Imipenem] Other (See Comments)    Possible allergy  . Voriconazole Other (See Comments)  . Sulfa Antibiotics Itching and Rash  Possible allergy to Zoloft, allopurinol, and a chemotherapy medication.  He tolerates Dapsone.  Current Medications: Current Outpatient Prescriptions  Medication Sig Dispense Refill  . acyclovir (ZOVIRAX) 400 MG tablet Take 800 mg by mouth 2 (two) times daily.    Marland Kitchen aspirin EC 81 MG tablet Take 81 mg by mouth daily.    . dapsone 100 MG tablet Take by mouth.    Marland Kitchen glucose blood (ACCU-CHEK AVIVA PLUS)  test strip Use to check sugar twice daily Dx:E11.9 200 each 3  . hydrochlorothiazide (HYDRODIURIL) 25 MG tablet Take 1 tablet (25 mg total) by mouth daily.    . insulin NPH Human (HUMULIN N) 100 UNIT/ML injection Inject 0.35 mLs (35 Units total) into the skin 2 (two) times daily before a meal. 20 mL 11  . insulin regular (HUMULIN R) 100 units/mL injection Inject 0.1 mLs (10 Units total) into the skin 2 (two) times daily before a meal. 10 mL 11  . lidocaine-prilocaine (EMLA) cream Apply 1 application topically as needed. 30 g 3  . lisinopril (PRINIVIL,ZESTRIL) 20 MG tablet TAKE ONE TABLET BY MOUTH TWICE DAILY 60 tablet 6  . metFORMIN (GLUCOPHAGE) 1000 MG tablet TAKE ONE TABLET BY MOUTH TWICE DAILY AS DIRECTED 180 tablet 3  . omeprazole (PRILOSEC) 20 MG capsule Take 40 mg by mouth daily.     . ondansetron (ZOFRAN) 8 MG tablet Take 1 tablet (8 mg total) by mouth every 8 (eight) hours as needed for nausea or vomiting. 20 tablet 0  . pravastatin (PRAVACHOL) 20 MG tablet TAKE ONE TABLET AT BEDTIME 30 tablet 6  . tamsulosin (FLOMAX) 0.4 MG CAPS capsule Take 0.4 mg by mouth daily.    . vitamin B-12 (CYANOCOBALAMIN) 1000 MCG tablet Take 1,000 mcg by mouth daily.    . vitamin E 400 UNIT capsule Take 200 Units by mouth daily.     Marland Kitchen voriconazole (VFEND) 200 MG tablet Take by mouth.    . temazepam (RESTORIL) 7.5 MG capsule Take 1 capsule (7.5 mg total) by mouth at bedtime as needed for sleep (insomnia). (Patient not taking: Reported on 03/28/2015) 30 capsule 0   No current facility-administered medications for this visit.   Facility-Administered Medications Ordered in Other Visits  Medication Dose Route Frequency Provider Last Rate Last Dose  . heparin lock flush 100 unit/mL  500 Units Intravenous Once Lequita Asal, MD      . sodium chloride 0.9 % injection 10 mL  10 mL Intravenous PRN Lequita Asal, MD   10 mL at 03/03/15 0903  . sodium chloride 0.9 % injection 10 mL  10 mL Intracatheter PRN  Lequita Asal, MD   10 mL at 03/10/15 1410   Review of Systems:  GENERAL:  Fatigue.  No energy.  No fevers or sweats.  Weight stable. PERFORMANCE STATUS (ECOG):  1-2 HEENT:  No visual changes, runny nose, sore throat. Pulmonary:  Little shortness of breath.  No cough.  No hemoptysis. Cardiac:  No chest pain, palpitations, orthopnea, or PND. GI:  Using Miralax for constipation.  No nausea, vomiting, diarrhea, melena or hematochezia. GU:  No urgency, frequency, dysuria, or hematuria. Musculoskeletal:  No back pain.  No joint pain.  No muscle tenderness. Extremities:  No pain or swelling. Skin:  No rashes or skin changes. Neuro:  No headache, numbness or weakness, balance or coordination issues. Endocrine:  Diabetes.  No thyroid issues, hot flashes or night sweats. Psych:  No mood changes, depression or anxiety. Pain:  No focal pain. Review of systems:  All other systems reviewed and found to be negative.   Physical Exam: Blood pressure 153/78, pulse 97, temperature 97 F (36.1 C), temperature source Tympanic, resp. rate 20, weight 219 lb 9.3 oz (99.6 kg), SpO2 94 %.  GENERAL:  Well developed, well nourished, sitting comfortably in the exam room in no acute distress. MENTAL STATUS:  Alert and oriented to person, place and time. HEAD:  Pearline Cables hair.  Normocephalic, atraumatic, face symmetric, no Cushingoid features. EYES:  Glasses.  Dark blue eyes.  Pupils equal round and reactive to light and accomodation.  No conjunctivitis or scleral icterus. ENT:  Oropharynx clear without lesion.  Tongue normal. Mucous membranes moist.  RESPIRATORY:  Clear to auscultation without rales, wheezes or rhonchi. CARDIOVASCULAR:  Regular rate and rhythm without murmur, rub or gallop. ABDOMEN:  Soft, non-tender, with active bowel sounds, and no hepatosplenomegaly.  No masses. SKIN:  Abdominal bruises secondary to injections.  No rashes, ulcers or lesions. EXTREMITIES: No edema, no skin discoloration or  tenderness.  No palpable cords. LYMPH NODES: No palpable cervical, supraclavicular, axillary or inguinal adenopathy  NEUROLOGICAL: Unremarkable. PSYCH:  Appropriate.   Appointment on 03/28/2015  Component Date Value Ref Range Status  . WBC 03/28/2015 1.3* 3.8 - 10.6 K/uL Final   Comment: RESULT REPEATED AND VERIFIED [facility]ENTER CRITICAL VALUE PROTOCOL   . RBC 03/28/2015 2.22* 4.40 - 5.90 MIL/uL Final  . Hemoglobin 03/28/2015 8.3* 13.0 - 18.0 g/dL Final  . HCT 03/28/2015 24.2* 40.0 - 52.0 % Final  . MCV 03/28/2015 109.1* 80.0 - 100.0 fL Final  . MCH 03/28/2015 37.3* 26.0 - 34.0 pg Final  . MCHC 03/28/2015 34.2  32.0 - 36.0 g/dL Final  . RDW 03/28/2015 16.6* 11.5 - 14.5 % Final  . Platelets 03/28/2015 122* 150 - 440 K/uL Final  . Neutrophils Relative % 03/28/2015 63%   Final  . Neutro Abs 03/28/2015 0.8* 1.4 - 6.5 K/uL Final  . Lymphocytes Relative 03/28/2015 16%   Final  . Lymphs Abs 03/28/2015 0.2* 1.0 - 3.6 K/uL Final  . Monocytes Relative 03/28/2015 12%   Final  . Monocytes Absolute 03/28/2015 0.2  0.2 - 1.0 K/uL Final  . Eosinophils Relative 03/28/2015 7%   Final  . Eosinophils Absolute 03/28/2015 0.1  0 - 0.7 K/uL Final  . Basophils Relative 03/28/2015 2%   Final  . Basophils Absolute 03/28/2015 0.0  0 - 0.1 K/uL Final  . Sodium 03/28/2015 131* 135 - 145 mmol/L Final  . Potassium 03/28/2015 4.6  3.5 - 5.1 mmol/L Final  . Chloride 03/28/2015 102  101 - 111 mmol/L Final  . CO2 03/28/2015 25  22 - 32 mmol/L Final  . Glucose, Bld 03/28/2015 231* 65 - 99 mg/dL Final  . BUN 03/28/2015 31* 6 - 20 mg/dL Final  . Creatinine, Ser 03/28/2015 1.50* 0.61 - 1.24 mg/dL Final  . Calcium 03/28/2015 7.9* 8.9 - 10.3 mg/dL Final  . Total Protein 03/28/2015 7.5  6.5 - 8.1 g/dL Final  . Albumin 03/28/2015 4.0  3.5 - 5.0 g/dL  Final  . AST 03/28/2015 44* 15 - 41 U/L Final  . ALT 03/28/2015 27  17 - 63 U/L Final  . Alkaline Phosphatase 03/28/2015 60  38 - 126 U/L Final  . Total Bilirubin  03/28/2015 0.6  0.3 - 1.2 mg/dL Final  . GFR calc non Af Amer 03/28/2015 43* >60 mL/min Final  . GFR calc Af Amer 03/28/2015 50* >60 mL/min Final   Comment: (NOTE) The eGFR has been calculated using the CKD EPI equation. This calculation has not been validated in all clinical situations. eGFR's persistently <60 mL/min signify possible Chronic Kidney Disease.   Georgiann Hahn gap 03/28/2015 4* 5 - 15 Final    Assessment:  Alex Smith is a 80 y.o. male with recurrent hairy cell leukemia.  He was diagnosed with hairy cell leukemia in 2006.  He received cladribine 11/06-11/05/2005.  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  Bone marrow aspirate and biopsy on 02/07/2015 revealed a extensive marrow involvement by recurrent hairy cell leukemia (approximately 80% of cells in the core). There was a monoclonal plasma cell infiltrate (approximately 10%), compatible with a plasma cell neoplasm. Marrow was hypercellular (40-50%) with markedly diminished residual trilineage hematopoiesis and mild multilineage dyspoiesis.  Peripheral smear revealed leukopenia (WBC 1900) with 2% atypical lymphoid cells compatible with hairy cell leukemia. There was moderate anemia with anisopoikilocytosis. 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  FISH studies for MDS were negative.  Cytogenetics were normal (46, XY).  He has an unclear allergy history.  He may be allergic to Primaxin, voriconazole, Zoloft, allopurinol, and a chemotherapy medication.  Reaction occurred in 06/2005.  He is allergic to sulfa.  He is not allergic to dapsone.  He requires washed RBCs.  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  He completed a course of Levaquin for pneumonia.  He is on prophylactic acyclovir, dapsone, and voriconazole.  Voriconazole is costly.  He is currently day 26 status post cladribine (began 03/03/2015).   Symptomatically, he is extremely fatigued.  He notes no energy. Exam is stable.  White count has improved from 900 to 1300 (ANC 600 to 800).  Hematocrit is 24.2 and platelets are 122,000.  Plan: 1. Labs today:  CBC with diff, type and cross. 2. Discuss symptomatic anemia and plan for transfusion. 3. Ensure all PRBC transfusions are leukopoor, irradiated, and washed. 4. Review fever and neutropenia precautions. 5. Adult nurse for assistance in covering voriconazole. 6. RTC tomorrow for transfusion.. 7. RTC every Monday and Thursday for labs (CBC with diff).  Type and screen on Mondays. 8. RTC in 2 weeks for MD assessment and labs (CBC with diff, CMP, hold tube).   Lequita Asal, MD  03/28/2015, 2:48 PM

## 2015-03-30 ENCOUNTER — Other Ambulatory Visit: Payer: Medicare Other

## 2015-03-30 ENCOUNTER — Inpatient Hospital Stay: Payer: Medicare Other

## 2015-03-30 DIAGNOSIS — R0602 Shortness of breath: Secondary | ICD-10-CM | POA: Diagnosis not present

## 2015-03-30 DIAGNOSIS — E119 Type 2 diabetes mellitus without complications: Secondary | ICD-10-CM | POA: Diagnosis not present

## 2015-03-30 DIAGNOSIS — K219 Gastro-esophageal reflux disease without esophagitis: Secondary | ICD-10-CM | POA: Diagnosis not present

## 2015-03-30 DIAGNOSIS — Z86010 Personal history of colonic polyps: Secondary | ICD-10-CM | POA: Diagnosis not present

## 2015-03-30 DIAGNOSIS — I10 Essential (primary) hypertension: Secondary | ICD-10-CM | POA: Diagnosis not present

## 2015-03-30 DIAGNOSIS — D61818 Other pancytopenia: Secondary | ICD-10-CM | POA: Diagnosis not present

## 2015-03-30 DIAGNOSIS — C9140 Hairy cell leukemia not having achieved remission: Secondary | ICD-10-CM | POA: Diagnosis not present

## 2015-03-30 DIAGNOSIS — E785 Hyperlipidemia, unspecified: Secondary | ICD-10-CM | POA: Diagnosis not present

## 2015-03-30 DIAGNOSIS — Z87891 Personal history of nicotine dependence: Secondary | ICD-10-CM | POA: Diagnosis not present

## 2015-03-30 DIAGNOSIS — E538 Deficiency of other specified B group vitamins: Secondary | ICD-10-CM | POA: Diagnosis not present

## 2015-03-30 DIAGNOSIS — R5383 Other fatigue: Secondary | ICD-10-CM | POA: Diagnosis not present

## 2015-03-30 DIAGNOSIS — Z85828 Personal history of other malignant neoplasm of skin: Secondary | ICD-10-CM | POA: Diagnosis not present

## 2015-03-30 DIAGNOSIS — N400 Enlarged prostate without lower urinary tract symptoms: Secondary | ICD-10-CM | POA: Diagnosis not present

## 2015-03-30 DIAGNOSIS — K5790 Diverticulosis of intestine, part unspecified, without perforation or abscess without bleeding: Secondary | ICD-10-CM | POA: Diagnosis not present

## 2015-03-30 DIAGNOSIS — R531 Weakness: Secondary | ICD-10-CM | POA: Diagnosis not present

## 2015-03-30 DIAGNOSIS — Z8701 Personal history of pneumonia (recurrent): Secondary | ICD-10-CM | POA: Diagnosis not present

## 2015-03-30 LAB — PREPARE RBC (CROSSMATCH)

## 2015-03-30 LAB — CBC WITH DIFFERENTIAL/PLATELET
Basophils Absolute: 0 10*3/uL (ref 0–0.1)
Basophils Relative: 1 %
Eosinophils Absolute: 0.1 10*3/uL (ref 0–0.7)
Eosinophils Relative: 7 %
HCT: 23.2 % — ABNORMAL LOW (ref 40.0–52.0)
Hemoglobin: 7.8 g/dL — ABNORMAL LOW (ref 13.0–18.0)
Lymphocytes Relative: 13 %
Lymphs Abs: 0.2 10*3/uL — ABNORMAL LOW (ref 1.0–3.6)
MCH: 37.3 pg — ABNORMAL HIGH (ref 26.0–34.0)
MCHC: 33.6 g/dL (ref 32.0–36.0)
MCV: 110.8 fL — ABNORMAL HIGH (ref 80.0–100.0)
Monocytes Absolute: 0.2 10*3/uL (ref 0.2–1.0)
Monocytes Relative: 10 %
Neutro Abs: 1.1 10*3/uL — ABNORMAL LOW (ref 1.4–6.5)
Neutrophils Relative %: 69 %
Platelets: 114 10*3/uL — ABNORMAL LOW (ref 150–440)
RBC: 2.09 MIL/uL — ABNORMAL LOW (ref 4.40–5.90)
RDW: 17.2 % — ABNORMAL HIGH (ref 11.5–14.5)
WBC: 1.6 10*3/uL — ABNORMAL LOW (ref 3.8–10.6)

## 2015-03-30 MED ORDER — HEPARIN SOD (PORK) LOCK FLUSH 100 UNIT/ML IV SOLN
500.0000 [IU] | Freq: Once | INTRAVENOUS | Status: AC
Start: 2015-03-30 — End: 2015-03-30
  Administered 2015-03-30: 500 [IU] via INTRAVENOUS
  Filled 2015-03-30: qty 5

## 2015-03-30 MED ORDER — SODIUM CHLORIDE 0.9 % IJ SOLN
10.0000 mL | Freq: Once | INTRAMUSCULAR | Status: AC
  Administered 2015-03-30: 10 mL via INTRAVENOUS
  Filled 2015-03-30: qty 10

## 2015-03-30 MED ORDER — ACETAMINOPHEN 325 MG PO TABS
650.0000 mg | ORAL_TABLET | Freq: Once | ORAL | Status: AC
  Administered 2015-03-30: 650 mg via ORAL
  Filled 2015-03-30: qty 2

## 2015-03-30 MED ORDER — SODIUM CHLORIDE 0.9 % IV SOLN
250.0000 mL | Freq: Once | INTRAVENOUS | Status: AC
  Administered 2015-03-30: 250 mL via INTRAVENOUS
  Filled 2015-03-30: qty 250

## 2015-03-30 MED ORDER — DIPHENHYDRAMINE HCL 25 MG PO CAPS
25.0000 mg | ORAL_CAPSULE | Freq: Once | ORAL | Status: AC
  Administered 2015-03-30: 25 mg via ORAL
  Filled 2015-03-30: qty 1

## 2015-03-30 NOTE — Progress Notes (Unsigned)
PSN left patient a message to call back to discuss patient assistance for the drug, Vfend.

## 2015-03-31 LAB — TYPE AND SCREEN
ABO/RH(D): A NEG
Antibody Screen: NEGATIVE
Unit division: 0

## 2015-04-03 ENCOUNTER — Inpatient Hospital Stay: Payer: Medicare Other

## 2015-04-03 ENCOUNTER — Other Ambulatory Visit: Payer: Medicare Other

## 2015-04-03 ENCOUNTER — Other Ambulatory Visit: Payer: Self-pay | Admitting: Family Medicine

## 2015-04-03 DIAGNOSIS — R531 Weakness: Secondary | ICD-10-CM | POA: Diagnosis not present

## 2015-04-03 DIAGNOSIS — E538 Deficiency of other specified B group vitamins: Secondary | ICD-10-CM | POA: Diagnosis not present

## 2015-04-03 DIAGNOSIS — D61818 Other pancytopenia: Secondary | ICD-10-CM | POA: Diagnosis not present

## 2015-04-03 DIAGNOSIS — C9140 Hairy cell leukemia not having achieved remission: Secondary | ICD-10-CM | POA: Diagnosis not present

## 2015-04-03 DIAGNOSIS — E119 Type 2 diabetes mellitus without complications: Secondary | ICD-10-CM | POA: Diagnosis not present

## 2015-04-03 DIAGNOSIS — Z86010 Personal history of colonic polyps: Secondary | ICD-10-CM | POA: Diagnosis not present

## 2015-04-03 DIAGNOSIS — E785 Hyperlipidemia, unspecified: Secondary | ICD-10-CM | POA: Diagnosis not present

## 2015-04-03 DIAGNOSIS — R0602 Shortness of breath: Secondary | ICD-10-CM | POA: Diagnosis not present

## 2015-04-03 DIAGNOSIS — Z8701 Personal history of pneumonia (recurrent): Secondary | ICD-10-CM | POA: Diagnosis not present

## 2015-04-03 DIAGNOSIS — Z85828 Personal history of other malignant neoplasm of skin: Secondary | ICD-10-CM | POA: Diagnosis not present

## 2015-04-03 DIAGNOSIS — N400 Enlarged prostate without lower urinary tract symptoms: Secondary | ICD-10-CM | POA: Diagnosis not present

## 2015-04-03 DIAGNOSIS — R5383 Other fatigue: Secondary | ICD-10-CM | POA: Diagnosis not present

## 2015-04-03 DIAGNOSIS — Z87891 Personal history of nicotine dependence: Secondary | ICD-10-CM | POA: Diagnosis not present

## 2015-04-03 DIAGNOSIS — I10 Essential (primary) hypertension: Secondary | ICD-10-CM | POA: Diagnosis not present

## 2015-04-03 DIAGNOSIS — K219 Gastro-esophageal reflux disease without esophagitis: Secondary | ICD-10-CM | POA: Diagnosis not present

## 2015-04-03 DIAGNOSIS — K5790 Diverticulosis of intestine, part unspecified, without perforation or abscess without bleeding: Secondary | ICD-10-CM | POA: Diagnosis not present

## 2015-04-03 LAB — CBC WITH DIFFERENTIAL/PLATELET
Basophils Absolute: 0 10*3/uL (ref 0–0.1)
Basophils Relative: 2 %
Eosinophils Absolute: 0.1 10*3/uL (ref 0–0.7)
Eosinophils Relative: 7 %
HCT: 27.3 % — ABNORMAL LOW (ref 40.0–52.0)
Hemoglobin: 9.4 g/dL — ABNORMAL LOW (ref 13.0–18.0)
Lymphocytes Relative: 10 %
Lymphs Abs: 0.2 10*3/uL — ABNORMAL LOW (ref 1.0–3.6)
MCH: 36.5 pg — ABNORMAL HIGH (ref 26.0–34.0)
MCHC: 34.4 g/dL (ref 32.0–36.0)
MCV: 106.3 fL — ABNORMAL HIGH (ref 80.0–100.0)
Monocytes Absolute: 0.2 10*3/uL (ref 0.2–1.0)
Monocytes Relative: 13 %
Neutro Abs: 1.3 10*3/uL — ABNORMAL LOW (ref 1.4–6.5)
Neutrophils Relative %: 68 %
Platelets: 108 10*3/uL — ABNORMAL LOW (ref 150–440)
RBC: 2.57 MIL/uL — ABNORMAL LOW (ref 4.40–5.90)
RDW: 19.4 % — ABNORMAL HIGH (ref 11.5–14.5)
WBC: 1.9 10*3/uL — ABNORMAL LOW (ref 3.8–10.6)

## 2015-04-03 LAB — SAMPLE TO BLOOD BANK

## 2015-04-06 ENCOUNTER — Inpatient Hospital Stay: Payer: Medicare Other

## 2015-04-06 ENCOUNTER — Other Ambulatory Visit: Payer: Medicare Other

## 2015-04-06 DIAGNOSIS — Z86010 Personal history of colonic polyps: Secondary | ICD-10-CM | POA: Diagnosis not present

## 2015-04-06 DIAGNOSIS — R531 Weakness: Secondary | ICD-10-CM | POA: Diagnosis not present

## 2015-04-06 DIAGNOSIS — N400 Enlarged prostate without lower urinary tract symptoms: Secondary | ICD-10-CM | POA: Diagnosis not present

## 2015-04-06 DIAGNOSIS — Z85828 Personal history of other malignant neoplasm of skin: Secondary | ICD-10-CM | POA: Diagnosis not present

## 2015-04-06 DIAGNOSIS — R5383 Other fatigue: Secondary | ICD-10-CM | POA: Diagnosis not present

## 2015-04-06 DIAGNOSIS — E785 Hyperlipidemia, unspecified: Secondary | ICD-10-CM | POA: Diagnosis not present

## 2015-04-06 DIAGNOSIS — I10 Essential (primary) hypertension: Secondary | ICD-10-CM | POA: Diagnosis not present

## 2015-04-06 DIAGNOSIS — D61818 Other pancytopenia: Secondary | ICD-10-CM | POA: Diagnosis not present

## 2015-04-06 DIAGNOSIS — Z87891 Personal history of nicotine dependence: Secondary | ICD-10-CM | POA: Diagnosis not present

## 2015-04-06 DIAGNOSIS — E119 Type 2 diabetes mellitus without complications: Secondary | ICD-10-CM | POA: Diagnosis not present

## 2015-04-06 DIAGNOSIS — K219 Gastro-esophageal reflux disease without esophagitis: Secondary | ICD-10-CM | POA: Diagnosis not present

## 2015-04-06 DIAGNOSIS — E538 Deficiency of other specified B group vitamins: Secondary | ICD-10-CM | POA: Diagnosis not present

## 2015-04-06 DIAGNOSIS — Z8701 Personal history of pneumonia (recurrent): Secondary | ICD-10-CM | POA: Diagnosis not present

## 2015-04-06 DIAGNOSIS — R0602 Shortness of breath: Secondary | ICD-10-CM | POA: Diagnosis not present

## 2015-04-06 DIAGNOSIS — K5790 Diverticulosis of intestine, part unspecified, without perforation or abscess without bleeding: Secondary | ICD-10-CM | POA: Diagnosis not present

## 2015-04-06 DIAGNOSIS — C9140 Hairy cell leukemia not having achieved remission: Secondary | ICD-10-CM

## 2015-04-06 LAB — CBC WITH DIFFERENTIAL/PLATELET
Basophils Absolute: 0 10*3/uL (ref 0–0.1)
Basophils Relative: 1 %
Eosinophils Absolute: 0.2 10*3/uL (ref 0–0.7)
Eosinophils Relative: 7 %
HCT: 26.3 % — ABNORMAL LOW (ref 40.0–52.0)
Hemoglobin: 9 g/dL — ABNORMAL LOW (ref 13.0–18.0)
Lymphocytes Relative: 9 %
Lymphs Abs: 0.2 10*3/uL — ABNORMAL LOW (ref 1.0–3.6)
MCH: 36.7 pg — ABNORMAL HIGH (ref 26.0–34.0)
MCHC: 34.3 g/dL (ref 32.0–36.0)
MCV: 106.9 fL — ABNORMAL HIGH (ref 80.0–100.0)
Monocytes Absolute: 0.3 10*3/uL (ref 0.2–1.0)
Monocytes Relative: 13 %
Neutro Abs: 1.6 10*3/uL (ref 1.4–6.5)
Neutrophils Relative %: 70 %
Platelets: 119 10*3/uL — ABNORMAL LOW (ref 150–440)
RBC: 2.46 MIL/uL — ABNORMAL LOW (ref 4.40–5.90)
RDW: 19.6 % — ABNORMAL HIGH (ref 11.5–14.5)
WBC: 2.3 10*3/uL — ABNORMAL LOW (ref 3.8–10.6)

## 2015-04-06 LAB — SAMPLE TO BLOOD BANK

## 2015-04-07 ENCOUNTER — Telehealth: Payer: Self-pay

## 2015-04-07 ENCOUNTER — Other Ambulatory Visit: Payer: Self-pay

## 2015-04-07 NOTE — Telephone Encounter (Signed)
Called and spoke with pt concerning dosing of medication voriconazole pt reports he is taking 200 mg twice daily.

## 2015-04-10 ENCOUNTER — Other Ambulatory Visit: Payer: Medicare Other

## 2015-04-10 ENCOUNTER — Other Ambulatory Visit: Payer: Self-pay | Admitting: Hematology and Oncology

## 2015-04-10 ENCOUNTER — Encounter: Payer: Self-pay | Admitting: Hematology and Oncology

## 2015-04-10 DIAGNOSIS — C9140 Hairy cell leukemia not having achieved remission: Secondary | ICD-10-CM

## 2015-04-10 NOTE — Progress Notes (Unsigned)
Patient assistance documentation was faxed to [facility] today.  Patient has been asked to bring his latest federal tax return so that it can be sent to [employer] as well.  [employer] will not consider offering assistance for the drug, Vfend, unless they receive the tax return.

## 2015-04-11 ENCOUNTER — Inpatient Hospital Stay (HOSPITAL_BASED_OUTPATIENT_CLINIC_OR_DEPARTMENT_OTHER): Payer: Medicare Other | Admitting: Hematology and Oncology

## 2015-04-11 ENCOUNTER — Inpatient Hospital Stay: Payer: Medicare Other

## 2015-04-11 ENCOUNTER — Ambulatory Visit: Payer: Medicare Other | Admitting: Hematology and Oncology

## 2015-04-11 VITALS — BP 118/68 | HR 88 | Temp 96.9°F | Resp 18 | Wt 220.0 lb

## 2015-04-11 DIAGNOSIS — E538 Deficiency of other specified B group vitamins: Secondary | ICD-10-CM | POA: Diagnosis not present

## 2015-04-11 DIAGNOSIS — Z8701 Personal history of pneumonia (recurrent): Secondary | ICD-10-CM

## 2015-04-11 DIAGNOSIS — I10 Essential (primary) hypertension: Secondary | ICD-10-CM | POA: Diagnosis not present

## 2015-04-11 DIAGNOSIS — D61818 Other pancytopenia: Secondary | ICD-10-CM | POA: Diagnosis not present

## 2015-04-11 DIAGNOSIS — K5790 Diverticulosis of intestine, part unspecified, without perforation or abscess without bleeding: Secondary | ICD-10-CM | POA: Diagnosis not present

## 2015-04-11 DIAGNOSIS — R5383 Other fatigue: Secondary | ICD-10-CM

## 2015-04-11 DIAGNOSIS — C9140 Hairy cell leukemia not having achieved remission: Secondary | ICD-10-CM

## 2015-04-11 DIAGNOSIS — Z85828 Personal history of other malignant neoplasm of skin: Secondary | ICD-10-CM

## 2015-04-11 DIAGNOSIS — Z87891 Personal history of nicotine dependence: Secondary | ICD-10-CM

## 2015-04-11 DIAGNOSIS — N400 Enlarged prostate without lower urinary tract symptoms: Secondary | ICD-10-CM

## 2015-04-11 DIAGNOSIS — E119 Type 2 diabetes mellitus without complications: Secondary | ICD-10-CM | POA: Diagnosis not present

## 2015-04-11 DIAGNOSIS — R531 Weakness: Secondary | ICD-10-CM | POA: Diagnosis not present

## 2015-04-11 DIAGNOSIS — R0602 Shortness of breath: Secondary | ICD-10-CM | POA: Diagnosis not present

## 2015-04-11 DIAGNOSIS — E785 Hyperlipidemia, unspecified: Secondary | ICD-10-CM

## 2015-04-11 DIAGNOSIS — K219 Gastro-esophageal reflux disease without esophagitis: Secondary | ICD-10-CM

## 2015-04-11 DIAGNOSIS — Z86010 Personal history of colonic polyps: Secondary | ICD-10-CM

## 2015-04-11 LAB — CBC WITH DIFFERENTIAL/PLATELET
Basophils Absolute: 0 10*3/uL (ref 0–0.1)
Basophils Relative: 1 %
Eosinophils Absolute: 0.3 10*3/uL (ref 0–0.7)
Eosinophils Relative: 11 %
HCT: 26.4 % — ABNORMAL LOW (ref 40.0–52.0)
Hemoglobin: 8.9 g/dL — ABNORMAL LOW (ref 13.0–18.0)
Lymphocytes Relative: 8 %
Lymphs Abs: 0.2 10*3/uL — ABNORMAL LOW (ref 1.0–3.6)
MCH: 37 pg — ABNORMAL HIGH (ref 26.0–34.0)
MCHC: 33.9 g/dL (ref 32.0–36.0)
MCV: 109.3 fL — ABNORMAL HIGH (ref 80.0–100.0)
Monocytes Absolute: 0.3 10*3/uL (ref 0.2–1.0)
Monocytes Relative: 12 %
Neutro Abs: 1.6 10*3/uL (ref 1.4–6.5)
Neutrophils Relative %: 68 %
Platelets: 126 10*3/uL — ABNORMAL LOW (ref 150–440)
RBC: 2.41 MIL/uL — ABNORMAL LOW (ref 4.40–5.90)
RDW: 19 % — ABNORMAL HIGH (ref 11.5–14.5)
WBC: 2.3 10*3/uL — ABNORMAL LOW (ref 3.8–10.6)

## 2015-04-11 LAB — COMPREHENSIVE METABOLIC PANEL
ALT: 29 U/L (ref 17–63)
AST: 32 U/L (ref 15–41)
Albumin: 3.9 g/dL (ref 3.5–5.0)
Alkaline Phosphatase: 63 U/L (ref 38–126)
Anion gap: 5 (ref 5–15)
BUN: 31 mg/dL — ABNORMAL HIGH (ref 6–20)
CO2: 24 mmol/L (ref 22–32)
Calcium: 8.4 mg/dL — ABNORMAL LOW (ref 8.9–10.3)
Chloride: 105 mmol/L (ref 101–111)
Creatinine, Ser: 1.63 mg/dL — ABNORMAL HIGH (ref 0.61–1.24)
GFR calc Af Amer: 45 mL/min — ABNORMAL LOW (ref >60)
GFR calc non Af Amer: 39 mL/min — ABNORMAL LOW (ref >60)
Glucose, Bld: 316 mg/dL — ABNORMAL HIGH (ref 65–99)
Potassium: 4.5 mmol/L (ref 3.5–5.1)
Sodium: 134 mmol/L — ABNORMAL LOW (ref 135–145)
Total Bilirubin: 0.5 mg/dL (ref 0.3–1.2)
Total Protein: 7.4 g/dL (ref 6.5–8.1)

## 2015-04-11 LAB — SAMPLE TO BLOOD BANK

## 2015-04-11 NOTE — Progress Notes (Signed)
[facility] day:  04/11/2015   Chief Complaint: Alex Smith is a 80 y.o. male with recurrent hairy cell leukemia who is seen for assessment on day 40 of cladribine.  HPI:  The patient was last seen in the medical oncology clinic on 03/28/2015.  At that time, he was day 26 s/p cladribine. Symptomatically he felt extremely fatigued no energy.  Exam was stable. Hematocrit was 24.2, platelets 122,000, WBC 1300 with an ANC of 800.  He received 1 unit on 03/30/2015.  During the interim, he has felt good.  Every day, he feels a little better.  He does note that he "can't vacuum".  He denies any infections.   Past Medical History  Diagnosis Date  . Hypertension   . GERD (gastroesophageal reflux disease)   . HLD (hyperlipidemia)   . History of pneumonia 2000's    "once" (07/07/2012)  . Type II diabetes mellitus   . Hairy cell leukemia 2006    recurrent, seizure on rituxan, now on cladribine ([place])  . Basal cell carcinoma of face   . Diverticulosis   . Colon polyps   . BPH (benign prostatic hypertrophy)     followed by urology, discharged (Dr. Bernardo Heater)  . B12 deficiency   . History of shingles   . Shortness of breath dyspnea   . Pneumonia     Past Surgical History  Procedure Laterality Date  . Eye surgery Left 06/2012    laser surgery  . Cataract extraction w/ intraocular lens  implant, bilateral  ~ 2010  . Skin cancer excision      "all over my face" (07/07/2012)  . 25 gauge pars plana vitrectomy with 20 gauge mvr port for macular hole  07/07/2012    Procedure: 25 GAUGE PARS PLANA VITRECTOMY WITH 20 GAUGE MVR PORT FOR MACULAR HOLE;  Surgeon: Hayden Pedro, MD;  Location: [facility];  Service: Ophthalmology;  Laterality: Left;  . Serum patch  07/07/2012    Procedure: SERUM PATCH;  Surgeon: Hayden Pedro, MD;  Location: [facility];  Service: Ophthalmology;  Laterality: Left;  . Gas insertion  07/07/2012    Procedure: INSERTION OF GAS;   Surgeon: Hayden Pedro, MD;  Location: [facility];  Service: Ophthalmology;  Laterality: Left;  C3F8  . Cardiovascular stress test  2013    treadmill - no evidence ischemia, EF 61%  . Colonoscopy  2014    [facility] WNL no rpt needed, h/o polyps  . Peripheral vascular catheterization N/A 02/23/2015    Procedure: Glori Luis Cath Insertion;  Surgeon: Algernon Huxley, MD;  Location: [facility];  Service: Cardiovascular;  Laterality: N/A;    Family History  Problem Relation Age of Onset  . Dementia Mother   . Heart failure Father 21  . Cancer Sister     breast  . Diabetes Paternal Uncle   . Diabetes Paternal Aunt   . CAD Brother 68    MI  . Stroke Neg Hx     Social History:  reports that he quit smoking about 46 years ago. His smoking use included Cigarettes. He has a 50 pack-year smoking history. He has never used smokeless tobacco. He reports that he drinks alcohol. He reports that he does not use illicit drugs.  He works at [employer] 20 hours/week.  The patient is accompanied by his wife today.  Allergies:  Allergies  Allergen Reactions  . Rituximab Rash    Chest tightness  .  Blood-Group Specific Substance Other (See Comments)    Had a post transfusion reaction of red blood cells; NOW REQUIRES WASHED BLOOD CELLS  . Primaxin [Imipenem] Other (See Comments)    Possible allergy  . Voriconazole Other (See Comments)  . Sulfa Antibiotics Itching and Rash  Possible allergy to Zoloft, allopurinol, and a chemotherapy medication.  He tolerates Dapsone.  Current Medications: Current Outpatient Prescriptions  Medication Sig Dispense Refill  . acyclovir (ZOVIRAX) 400 MG tablet Take 800 mg by mouth 2 (two) times daily.    Marland Kitchen aspirin EC 81 MG tablet Take 81 mg by mouth daily.    . dapsone 100 MG tablet Take by mouth.    Marland Kitchen glucose blood (ACCU-CHEK AVIVA PLUS) test strip Use to check sugar twice daily Dx:E11.9 200 each 3  . hydrochlorothiazide (HYDRODIURIL) 25 MG tablet Take 1 tablet (25 mg total)  by mouth daily.    . insulin NPH Human (HUMULIN N) 100 UNIT/ML injection Inject 0.35 mLs (35 Units total) into the skin 2 (two) times daily before a meal. 20 mL 11  . insulin regular (HUMULIN R) 100 units/mL injection Inject 0.1 mLs (10 Units total) into the skin 2 (two) times daily before a meal. 10 mL 11  . lidocaine-prilocaine (EMLA) cream Apply 1 application topically as needed. 30 g 3  . lisinopril (PRINIVIL,ZESTRIL) 20 MG tablet TAKE ONE TABLET BY MOUTH TWICE DAILY 60 tablet 6  . metFORMIN (GLUCOPHAGE) 1000 MG tablet TAKE ONE TABLET BY MOUTH TWICE DAILY AS DIRECTED 180 tablet 3  . omeprazole (PRILOSEC) 20 MG capsule Take 40 mg by mouth daily.     . ondansetron (ZOFRAN) 8 MG tablet Take 1 tablet (8 mg total) by mouth every 8 (eight) hours as needed for nausea or vomiting. 20 tablet 0  . pravastatin (PRAVACHOL) 20 MG tablet TAKE ONE TABLET AT BEDTIME 30 tablet 6  . tamsulosin (FLOMAX) 0.4 MG CAPS capsule Take 0.4 mg by mouth daily.    . vitamin B-12 (CYANOCOBALAMIN) 1000 MCG tablet Take 1,000 mcg by mouth daily.    . vitamin E 400 UNIT capsule Take 200 Units by mouth daily.      No current facility-administered medications for this visit.   Facility-Administered Medications Ordered in Other Visits  Medication Dose Route Frequency Provider Last Rate Last Dose  . heparin lock flush 100 unit/mL  500 Units Intravenous Once Lequita Asal, MD      . sodium chloride 0.9 % injection 10 mL  10 mL Intravenous PRN Lequita Asal, MD   10 mL at 03/03/15 0903  . sodium chloride 0.9 % injection 10 mL  10 mL Intracatheter PRN Lequita Asal, MD   10 mL at 03/10/15 1410   Review of Systems:  GENERAL:  Energy level improving.  No fevers or sweats.  Weight stable. PERFORMANCE STATUS (ECOG):  1-2 HEENT:  No visual changes, runny nose, sore throat. Pulmonary:  No shortness of breath.  No cough.  No hemoptysis. Cardiac:  No chest pain, palpitations, orthopnea, or PND. GI:  Using Miralax for  constipation.  No nausea, vomiting, diarrhea, melena or hematochezia. GU:  No urgency, frequency, dysuria, or hematuria. Musculoskeletal:  No back pain.  No joint pain.  No muscle tenderness. Extremities:  No pain or swelling. Skin:  No rashes or skin changes. Neuro:  No headache, numbness or weakness, balance or coordination issues. Endocrine:  Diabetes.  No thyroid issues, hot flashes or night sweats. Psych:  No mood changes, depression or anxiety.  Pain:  No focal pain. Review of systems:  All other systems reviewed and found to be negative.   Physical Exam: Blood pressure 118/68, pulse 88, temperature 96.9 F (36.1 C), temperature source Tympanic, resp. rate 18, weight 220 lb 0.3 oz (99.8 kg).  GENERAL:  Well developed, well nourished, sitting comfortably in the exam room in no acute distress. MENTAL STATUS:  Alert and oriented to person, place and time. HEAD:  Pearline Cables hair.  Normocephalic, atraumatic, face symmetric, no Cushingoid features. EYES:  Glasses.  Dark blue eyes.  Pupils equal round and reactive to light and accomodation.  No conjunctivitis or scleral icterus. ENT:  Oropharynx clear without lesion.  Tongue normal. Mucous membranes moist.  RESPIRATORY:  Clear to auscultation without rales, wheezes or rhonchi. CARDIOVASCULAR:  Regular rate and rhythm without murmur, rub or gallop. ABDOMEN:  Soft, non-tender, with active bowel sounds, and no hepatosplenomegaly.  No masses. SKIN:  Abdominal bruises secondary to injections.  No rashes, ulcers or lesions. EXTREMITIES: No edema, no skin discoloration or tenderness.  No palpable cords. LYMPH NODES: No palpable cervical, supraclavicular, axillary or inguinal adenopathy  NEUROLOGICAL: Unremarkable. PSYCH:  Appropriate.   Appointment on 04/11/2015  Component Date Value Ref Range Status  . Sodium 04/11/2015 134* 135 - 145 mmol/L Final  . Potassium 04/11/2015 4.5  3.5 - 5.1 mmol/L Final  . Chloride 04/11/2015 105  101 - 111 mmol/L Final   . CO2 04/11/2015 24  22 - 32 mmol/L Final  . Glucose, Bld 04/11/2015 316* 65 - 99 mg/dL Final  . BUN 04/11/2015 31* 6 - 20 mg/dL Final  . Creatinine, Ser 04/11/2015 1.63* 0.61 - 1.24 mg/dL Final  . Calcium 04/11/2015 8.4* 8.9 - 10.3 mg/dL Final  . Total Protein 04/11/2015 7.4  6.5 - 8.1 g/dL Final  . Albumin 04/11/2015 3.9  3.5 - 5.0 g/dL Final  . AST 04/11/2015 32  15 - 41 U/L Final  . ALT 04/11/2015 29  17 - 63 U/L Final  . Alkaline Phosphatase 04/11/2015 63  38 - 126 U/L Final  . Total Bilirubin 04/11/2015 0.5  0.3 - 1.2 mg/dL Final  . GFR calc non Af Amer 04/11/2015 39* >60 mL/min Final  . GFR calc Af Amer 04/11/2015 45* >60 mL/min Final   Comment: (NOTE) The eGFR has been calculated using the CKD EPI equation. This calculation has not been validated in all clinical situations. eGFR's persistently <60 mL/min signify possible Chronic Kidney Disease.   . Anion gap 04/11/2015 5  5 - 15 Final  . WBC 04/11/2015 2.3* 3.8 - 10.6 K/uL Final  . RBC 04/11/2015 2.41* 4.40 - 5.90 MIL/uL Final  . Hemoglobin 04/11/2015 8.9* 13.0 - 18.0 g/dL Final  . HCT 04/11/2015 26.4* 40.0 - 52.0 % Final  . MCV 04/11/2015 109.3* 80.0 - 100.0 fL Final  . MCH 04/11/2015 37.0* 26.0 - 34.0 pg Final  . MCHC 04/11/2015 33.9  32.0 - 36.0 g/dL Final  . RDW 04/11/2015 19.0* 11.5 - 14.5 % Final  . Platelets 04/11/2015 126* 150 - 440 K/uL Final  . Neutrophils Relative % 04/11/2015 68   Final  . Neutro Abs 04/11/2015 1.6  1.4 - 6.5 K/uL Final  . Lymphocytes Relative 04/11/2015 8   Final  . Lymphs Abs 04/11/2015 0.2* 1.0 - 3.6 K/uL Final  . Monocytes Relative 04/11/2015 12   Final  . Monocytes Absolute 04/11/2015 0.3  0.2 - 1.0 K/uL Final  . Eosinophils Relative 04/11/2015 11   Final  . Eosinophils Absolute 04/11/2015  0.3  0 - 0.7 K/uL Final  . Basophils Relative 04/11/2015 1   Final  . Basophils Absolute 04/11/2015 0.0  0 - 0.1 K/uL Final    Assessment:  Alex Smith is a 80 y.o. male with recurrent hairy  cell leukemia.  He was diagnosed with hairy cell leukemia in 2006.  He received cladribine 11/06-11/05/2005.  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  Bone marrow aspirate and biopsy on 02/07/2015 revealed a extensive marrow involvement by recurrent hairy cell leukemia (approximately 80% of cells in the core). There was a monoclonal plasma cell infiltrate (approximately 10%), compatible with a plasma cell neoplasm. Marrow was hypercellular (40-50%) with markedly diminished residual trilineage hematopoiesis and mild multilineage dyspoiesis.  Peripheral smear revealed leukopenia (WBC 1900) with 2% atypical lymphoid cells compatible with hairy cell leukemia. There was moderate anemia with anisopoikilocytosis. 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  FISH studies for MDS were negative.  Cytogenetics were normal (46, XY).  He has an unclear allergy history.  He may be allergic to Primaxin, voriconazole, Zoloft, allopurinol, and a chemotherapy medication.  Reaction occurred in 06/2005.  He is allergic to sulfa.  He is not allergic to dapsone.  He requires washed RBCs.  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  He completed a course of Levaquin for pneumonia.  He is on prophylactic acyclovir, dapsone, and voriconazole.  Voriconazole is costly.  He is currently day 40 status post cladribine (began 03/03/2015).  Symptomatically, his energy level is improving.  Exam is stable.  White count has improved from 1300 to 2300 (ANC 800 to 1600).  Hematocrit is 26.4 and platelets are 126,000.  Creatinine is 1.63 (CrCl 39 ml/min).  Plan: 1. Labs today:  CBC with diff, CMP. 2. Discuss improving counts.  If [place] continues to improve, anticipate discontinuation of voriconazole 3. Monitor creatinine with visits.  If CrCl < 25 ml/min, adjust medications. 4. RTC weekly for CBC with diff and type and  hold. 5. RTC in 2 weeks for MD assessment and labs (CBC with diff, CMP, type and hold).   Lequita Asal, MD  04/11/2015, 10:15 AM

## 2015-04-17 ENCOUNTER — Other Ambulatory Visit: Payer: Self-pay | Admitting: Family Medicine

## 2015-04-18 ENCOUNTER — Inpatient Hospital Stay: Payer: Medicare Other

## 2015-04-18 ENCOUNTER — Other Ambulatory Visit: Payer: Medicare Other

## 2015-04-18 DIAGNOSIS — K219 Gastro-esophageal reflux disease without esophagitis: Secondary | ICD-10-CM | POA: Diagnosis not present

## 2015-04-18 DIAGNOSIS — E785 Hyperlipidemia, unspecified: Secondary | ICD-10-CM | POA: Diagnosis not present

## 2015-04-18 DIAGNOSIS — E538 Deficiency of other specified B group vitamins: Secondary | ICD-10-CM | POA: Diagnosis not present

## 2015-04-18 DIAGNOSIS — N400 Enlarged prostate without lower urinary tract symptoms: Secondary | ICD-10-CM | POA: Diagnosis not present

## 2015-04-18 DIAGNOSIS — Z86010 Personal history of colonic polyps: Secondary | ICD-10-CM | POA: Diagnosis not present

## 2015-04-18 DIAGNOSIS — D61818 Other pancytopenia: Secondary | ICD-10-CM | POA: Diagnosis not present

## 2015-04-18 DIAGNOSIS — R531 Weakness: Secondary | ICD-10-CM | POA: Diagnosis not present

## 2015-04-18 DIAGNOSIS — Z8701 Personal history of pneumonia (recurrent): Secondary | ICD-10-CM | POA: Diagnosis not present

## 2015-04-18 DIAGNOSIS — K5790 Diverticulosis of intestine, part unspecified, without perforation or abscess without bleeding: Secondary | ICD-10-CM | POA: Diagnosis not present

## 2015-04-18 DIAGNOSIS — C9140 Hairy cell leukemia not having achieved remission: Secondary | ICD-10-CM | POA: Diagnosis not present

## 2015-04-18 DIAGNOSIS — R5383 Other fatigue: Secondary | ICD-10-CM | POA: Diagnosis not present

## 2015-04-18 DIAGNOSIS — Z87891 Personal history of nicotine dependence: Secondary | ICD-10-CM | POA: Diagnosis not present

## 2015-04-18 DIAGNOSIS — E119 Type 2 diabetes mellitus without complications: Secondary | ICD-10-CM | POA: Diagnosis not present

## 2015-04-18 DIAGNOSIS — R0602 Shortness of breath: Secondary | ICD-10-CM | POA: Diagnosis not present

## 2015-04-18 DIAGNOSIS — I10 Essential (primary) hypertension: Secondary | ICD-10-CM | POA: Diagnosis not present

## 2015-04-18 DIAGNOSIS — Z85828 Personal history of other malignant neoplasm of skin: Secondary | ICD-10-CM | POA: Diagnosis not present

## 2015-04-18 LAB — CBC WITH DIFFERENTIAL/PLATELET
Basophils Absolute: 0 10*3/uL (ref 0–0.1)
Basophils Relative: 1 %
Eosinophils Absolute: 0.2 10*3/uL (ref 0–0.7)
Eosinophils Relative: 11 %
HCT: 25.9 % — ABNORMAL LOW (ref 40.0–52.0)
Hemoglobin: 8.8 g/dL — ABNORMAL LOW (ref 13.0–18.0)
Lymphocytes Relative: 9 %
Lymphs Abs: 0.2 10*3/uL — ABNORMAL LOW (ref 1.0–3.6)
MCH: 37.1 pg — ABNORMAL HIGH (ref 26.0–34.0)
MCHC: 34.1 g/dL (ref 32.0–36.0)
MCV: 108.8 fL — ABNORMAL HIGH (ref 80.0–100.0)
Monocytes Absolute: 0.3 10*3/uL (ref 0.2–1.0)
Monocytes Relative: 13 %
Neutro Abs: 1.5 10*3/uL (ref 1.4–6.5)
Neutrophils Relative %: 66 %
Platelets: 129 10*3/uL — ABNORMAL LOW (ref 150–440)
RBC: 2.38 MIL/uL — ABNORMAL LOW (ref 4.40–5.90)
RDW: 19.3 % — ABNORMAL HIGH (ref 11.5–14.5)
WBC: 2.2 10*3/uL — ABNORMAL LOW (ref 3.8–10.6)

## 2015-04-23 ENCOUNTER — Encounter: Payer: Self-pay | Admitting: Hematology and Oncology

## 2015-04-25 ENCOUNTER — Inpatient Hospital Stay (HOSPITAL_BASED_OUTPATIENT_CLINIC_OR_DEPARTMENT_OTHER): Payer: Medicare Other | Admitting: Hematology and Oncology

## 2015-04-25 ENCOUNTER — Inpatient Hospital Stay: Payer: Medicare Other | Attending: Hematology and Oncology

## 2015-04-25 VITALS — BP 148/80 | HR 101 | Temp 97.2°F | Wt 215.9 lb

## 2015-04-25 DIAGNOSIS — Z7982 Long term (current) use of aspirin: Secondary | ICD-10-CM

## 2015-04-25 DIAGNOSIS — Z86010 Personal history of colonic polyps: Secondary | ICD-10-CM

## 2015-04-25 DIAGNOSIS — Z79899 Other long term (current) drug therapy: Secondary | ICD-10-CM

## 2015-04-25 DIAGNOSIS — K219 Gastro-esophageal reflux disease without esophagitis: Secondary | ICD-10-CM | POA: Insufficient documentation

## 2015-04-25 DIAGNOSIS — E785 Hyperlipidemia, unspecified: Secondary | ICD-10-CM

## 2015-04-25 DIAGNOSIS — E538 Deficiency of other specified B group vitamins: Secondary | ICD-10-CM

## 2015-04-25 DIAGNOSIS — E119 Type 2 diabetes mellitus without complications: Secondary | ICD-10-CM | POA: Insufficient documentation

## 2015-04-25 DIAGNOSIS — K5790 Diverticulosis of intestine, part unspecified, without perforation or abscess without bleeding: Secondary | ICD-10-CM | POA: Diagnosis not present

## 2015-04-25 DIAGNOSIS — C9140 Hairy cell leukemia not having achieved remission: Secondary | ICD-10-CM | POA: Insufficient documentation

## 2015-04-25 DIAGNOSIS — R0602 Shortness of breath: Secondary | ICD-10-CM | POA: Insufficient documentation

## 2015-04-25 DIAGNOSIS — Z87891 Personal history of nicotine dependence: Secondary | ICD-10-CM

## 2015-04-25 DIAGNOSIS — N400 Enlarged prostate without lower urinary tract symptoms: Secondary | ICD-10-CM | POA: Insufficient documentation

## 2015-04-25 DIAGNOSIS — I10 Essential (primary) hypertension: Secondary | ICD-10-CM | POA: Insufficient documentation

## 2015-04-25 DIAGNOSIS — Z8701 Personal history of pneumonia (recurrent): Secondary | ICD-10-CM

## 2015-04-25 DIAGNOSIS — Z85828 Personal history of other malignant neoplasm of skin: Secondary | ICD-10-CM | POA: Diagnosis not present

## 2015-04-25 DIAGNOSIS — D61818 Other pancytopenia: Secondary | ICD-10-CM | POA: Insufficient documentation

## 2015-04-25 DIAGNOSIS — Z794 Long term (current) use of insulin: Secondary | ICD-10-CM

## 2015-04-25 LAB — CBC WITH DIFFERENTIAL/PLATELET
Basophils Absolute: 0 10*3/uL (ref 0–0.1)
Basophils Relative: 1 %
Eosinophils Absolute: 0.3 10*3/uL (ref 0–0.7)
Eosinophils Relative: 11 %
HCT: 26.7 % — ABNORMAL LOW (ref 40.0–52.0)
Hemoglobin: 9.1 g/dL — ABNORMAL LOW (ref 13.0–18.0)
Lymphocytes Relative: 15 %
Lymphs Abs: 0.4 10*3/uL — ABNORMAL LOW (ref 1.0–3.6)
MCH: 37.3 pg — ABNORMAL HIGH (ref 26.0–34.0)
MCHC: 34.2 g/dL (ref 32.0–36.0)
MCV: 109.1 fL — ABNORMAL HIGH (ref 80.0–100.0)
Monocytes Absolute: 0.3 10*3/uL (ref 0.2–1.0)
Monocytes Relative: 12 %
Neutro Abs: 1.7 10*3/uL (ref 1.4–6.5)
Neutrophils Relative %: 61 %
Platelets: 124 10*3/uL — ABNORMAL LOW (ref 150–440)
RBC: 2.45 MIL/uL — ABNORMAL LOW (ref 4.40–5.90)
RDW: 18.8 % — ABNORMAL HIGH (ref 11.5–14.5)
WBC: 2.7 10*3/uL — ABNORMAL LOW (ref 3.8–10.6)

## 2015-04-25 LAB — TYPE AND SCREEN
ABO/RH(D): A NEG
Antibody Screen: NEGATIVE

## 2015-04-25 LAB — COMPREHENSIVE METABOLIC PANEL
ALT: 21 U/L (ref 17–63)
AST: 25 U/L (ref 15–41)
Albumin: 4 g/dL (ref 3.5–5.0)
Alkaline Phosphatase: 64 U/L (ref 38–126)
Anion gap: 8 (ref 5–15)
BUN: 31 mg/dL — ABNORMAL HIGH (ref 6–20)
CO2: 24 mmol/L (ref 22–32)
Calcium: 8.5 mg/dL — ABNORMAL LOW (ref 8.9–10.3)
Chloride: 101 mmol/L (ref 101–111)
Creatinine, Ser: 1.42 mg/dL — ABNORMAL HIGH (ref 0.61–1.24)
GFR calc Af Amer: 53 mL/min — ABNORMAL LOW (ref >60)
GFR calc non Af Amer: 46 mL/min — ABNORMAL LOW (ref >60)
Glucose, Bld: 372 mg/dL — ABNORMAL HIGH (ref 65–99)
Potassium: 4.1 mmol/L (ref 3.5–5.1)
Sodium: 133 mmol/L — ABNORMAL LOW (ref 135–145)
Total Bilirubin: 0.8 mg/dL (ref 0.3–1.2)
Total Protein: 7.6 g/dL (ref 6.5–8.1)

## 2015-04-25 NOTE — Progress Notes (Signed)
[facility] day:  04/25/2015  Chief Complaint: Alex Smith is a 80 y.o. male with recurrent hairy cell leukemia who is seen for assessment on day 54 of cladribine.  HPI:  The patient was last seen in the medical oncology clinic on 04/11/2015.  At that time, he was day 40 s/p cladribine. Symptomatically, he felt good. Exam was stable. Hematocrit was 26.4, platelets 126,000, WBC 2300 with an ANC of 1600.   During the interim, he has continued to feel good.  He is interested in going back to work.   When dscussing his blood sugar, he states that this morning it was 171. Blood sugar typically ranges between 130 and 190.  He denies any fevers or infections.  Past Medical History  Diagnosis Date  . Hypertension   . GERD (gastroesophageal reflux disease)   . HLD (hyperlipidemia)   . History of pneumonia 2000's    "once" (07/07/2012)  . Type II diabetes mellitus   . Hairy cell leukemia 2006    recurrent, seizure on rituxan, now on cladribine ([place])  . Basal cell carcinoma of face   . Diverticulosis   . Colon polyps   . BPH (benign prostatic hypertrophy)     followed by urology, discharged (Dr. Bernardo Heater)  . B12 deficiency   . History of shingles   . Shortness of breath dyspnea   . Pneumonia     Past Surgical History  Procedure Laterality Date  . Eye surgery Left 06/2012    laser surgery  . Cataract extraction w/ intraocular lens  implant, bilateral  ~ 2010  . Skin cancer excision      "all over my face" (07/07/2012)  . 25 gauge pars plana vitrectomy with 20 gauge mvr port for macular hole  07/07/2012    Procedure: 25 GAUGE PARS PLANA VITRECTOMY WITH 20 GAUGE MVR PORT FOR MACULAR HOLE;  Surgeon: Hayden Pedro, MD;  Location: [facility];  Service: Ophthalmology;  Laterality: Left;  . Serum patch  07/07/2012    Procedure: SERUM PATCH;  Surgeon: Hayden Pedro, MD;  Location: [facility];  Service: Ophthalmology;  Laterality: Left;  . Gas  insertion  07/07/2012    Procedure: INSERTION OF GAS;  Surgeon: Hayden Pedro, MD;  Location: [facility];  Service: Ophthalmology;  Laterality: Left;  C3F8  . Cardiovascular stress test  2013    treadmill - no evidence ischemia, EF 61%  . Colonoscopy  2014    [facility] WNL no rpt needed, h/o polyps  . Peripheral vascular catheterization N/A 02/23/2015    Procedure: Glori Luis Cath Insertion;  Surgeon: Algernon Huxley, MD;  Location: [facility];  Service: Cardiovascular;  Laterality: N/A;    Family History  Problem Relation Age of Onset  . Dementia Mother   . Heart failure Father 54  . Cancer Sister     breast  . Diabetes Paternal Uncle   . Diabetes Paternal Aunt   . CAD Brother 48    MI  . Stroke Neg Hx     Social History:  reports that he quit smoking about 46 years ago. His smoking use included Cigarettes. He has a 50 pack-year smoking history. He has never used smokeless tobacco. He reports that he drinks alcohol. He reports that he does not use illicit drugs.  He works at [employer] 20 hours/week.  The patient is accompanied by his wife today.  Allergies:  Allergies  Allergen Reactions  .  Rituximab Rash    Chest tightness  . Blood-Group Specific Substance Other (See Comments)    Had a post transfusion reaction of red blood cells; NOW REQUIRES WASHED BLOOD CELLS  . Primaxin [Imipenem] Other (See Comments)    Possible allergy  . Sulfa Antibiotics Itching and Rash  Possible allergy to Zoloft, allopurinol, and a chemotherapy medication.  He tolerates Dapsone.  Current Medications: Current Outpatient Prescriptions  Medication Sig Dispense Refill  . acyclovir (ZOVIRAX) 400 MG tablet Take 800 mg by mouth 2 (two) times daily.    Marland Kitchen aspirin EC 81 MG tablet Take 81 mg by mouth daily.    . dapsone 100 MG tablet Take by mouth.    Marland Kitchen glucose blood (ACCU-CHEK AVIVA PLUS) test strip Use to check sugar twice daily Dx:E11.9 200 each 3  . hydrochlorothiazide (HYDRODIURIL) 25 MG tablet Take 1  tablet (25 mg total) by mouth daily.    . insulin NPH Human (HUMULIN N) 100 UNIT/ML injection Inject 0.35 mLs (35 Units total) into the skin 2 (two) times daily before a meal. 20 mL 11  . insulin regular (HUMULIN R) 100 units/mL injection Inject 0.1 mLs (10 Units total) into the skin 2 (two) times daily before a meal. 10 mL 11  . lidocaine-prilocaine (EMLA) cream Apply 1 application topically as needed. 30 g 3  . lisinopril (PRINIVIL,ZESTRIL) 20 MG tablet TAKE ONE TABLET BY MOUTH TWICE DAILY 60 tablet 6  . metFORMIN (GLUCOPHAGE) 1000 MG tablet TAKE ONE TABLET BY MOUTH TWICE DAILY AS DIRECTED 180 tablet 3  . MICROLET LANCETS MISC USE AS DIRECTED 200 each 3  . omeprazole (PRILOSEC) 20 MG capsule Take 40 mg by mouth daily.     . ondansetron (ZOFRAN) 8 MG tablet Take 1 tablet (8 mg total) by mouth every 8 (eight) hours as needed for nausea or vomiting. 20 tablet 0  . pravastatin (PRAVACHOL) 20 MG tablet TAKE ONE TABLET AT BEDTIME 30 tablet 6  . tamsulosin (FLOMAX) 0.4 MG CAPS capsule Take 0.4 mg by mouth daily.    . vitamin B-12 (CYANOCOBALAMIN) 1000 MCG tablet Take 1,000 mcg by mouth daily.    . vitamin E 400 UNIT capsule Take 200 Units by mouth daily.      No current facility-administered medications for this visit.   Facility-Administered Medications Ordered in Other Visits  Medication Dose Route Frequency Provider Last Rate Last Dose  . heparin lock flush 100 unit/mL  500 Units Intravenous Once Lequita Asal, MD      . sodium chloride 0.9 % injection 10 mL  10 mL Intravenous PRN Lequita Asal, MD   10 mL at 03/03/15 0903  . sodium chloride 0.9 % injection 10 mL  10 mL Intracatheter PRN Lequita Asal, MD   10 mL at 03/10/15 1410   Review of Systems:  GENERAL:  Energy level good.  No fevers or sweats.  Weight stable. PERFORMANCE STATUS (ECOG):  1 HEENT:  No visual changes, runny nose, sore throat. Pulmonary:  No shortness of breath.  No cough.  No hemoptysis. Cardiac:  No  chest pain, palpitations, orthopnea, or PND. GI:  Using Miralax for constipation.  No nausea, vomiting, diarrhea, melena or hematochezia. GU:  No urgency, frequency, dysuria, or hematuria. Musculoskeletal:  No back pain.  No joint pain.  No muscle tenderness. Extremities:  No pain or swelling. Skin:  No rashes or skin changes. Neuro:  No headache, numbness or weakness, balance or coordination issues. Endocrine:  Diabetes.  No thyroid  issues, hot flashes or night sweats. Psych:  No mood changes, depression or anxiety. Pain:  No focal pain. Review of systems:  All other systems reviewed and found to be negative.   Physical Exam: Blood pressure 148/80, pulse 101, temperature 97.2 F (36.2 C), temperature source Oral, weight 215 lb 15.1 oz (97.95 kg).  GENERAL:  Well developed, well nourished, sitting comfortably in the exam room in no acute distress. MENTAL STATUS:  Alert and oriented to person, place and time. HEAD:  Pearline Cables hair.  Normocephalic, atraumatic, face symmetric, no Cushingoid features. EYES:  Glasses.  Dark blue eyes.  Pupils equal round and reactive to light and accomodation.  No conjunctivitis or scleral icterus. ENT:  Oropharynx clear without lesion.  Tongue normal. Mucous membranes moist.  RESPIRATORY:  Clear to auscultation without rales, wheezes or rhonchi. CARDIOVASCULAR:  Regular rate and rhythm without murmur, rub or gallop. ABDOMEN:  Soft, non-tender, with active bowel sounds, and no hepatosplenomegaly.  No masses. SKIN:  Abdominal bruises secondary to injections.  No rashes, ulcers or lesions. EXTREMITIES: No edema, no skin discoloration or tenderness.  No palpable cords. LYMPH NODES: No palpable cervical, supraclavicular, axillary or inguinal adenopathy  NEUROLOGICAL: Unremarkable. PSYCH:  Appropriate.   Appointment on 04/25/2015  Component Date Value Ref Range Status  . WBC 04/25/2015 2.7* 3.8 - 10.6 K/uL Final  . RBC 04/25/2015 2.45* 4.40 - 5.90 MIL/uL Final  .  Hemoglobin 04/25/2015 9.1* 13.0 - 18.0 g/dL Final  . HCT 04/25/2015 26.7* 40.0 - 52.0 % Final  . MCV 04/25/2015 109.1* 80.0 - 100.0 fL Final  . MCH 04/25/2015 37.3* 26.0 - 34.0 pg Final  . MCHC 04/25/2015 34.2  32.0 - 36.0 g/dL Final  . RDW 04/25/2015 18.8* 11.5 - 14.5 % Final  . Platelets 04/25/2015 124* 150 - 440 K/uL Final  . Neutrophils Relative % 04/25/2015 61   Final  . Neutro Abs 04/25/2015 1.7  1.4 - 6.5 K/uL Final  . Lymphocytes Relative 04/25/2015 15   Final  . Lymphs Abs 04/25/2015 0.4* 1.0 - 3.6 K/uL Final  . Monocytes Relative 04/25/2015 12   Final  . Monocytes Absolute 04/25/2015 0.3  0.2 - 1.0 K/uL Final  . Eosinophils Relative 04/25/2015 11   Final  . Eosinophils Absolute 04/25/2015 0.3  0 - 0.7 K/uL Final  . Basophils Relative 04/25/2015 1   Final  . Basophils Absolute 04/25/2015 0.0  0 - 0.1 K/uL Final  . Sodium 04/25/2015 133* 135 - 145 mmol/L Final  . Potassium 04/25/2015 4.1  3.5 - 5.1 mmol/L Final  . Chloride 04/25/2015 101  101 - 111 mmol/L Final  . CO2 04/25/2015 24  22 - 32 mmol/L Final  . Glucose, Bld 04/25/2015 372* 65 - 99 mg/dL Final  . BUN 04/25/2015 31* 6 - 20 mg/dL Final  . Creatinine, Ser 04/25/2015 1.42* 0.61 - 1.24 mg/dL Final  . Calcium 04/25/2015 8.5* 8.9 - 10.3 mg/dL Final  . Total Protein 04/25/2015 7.6  6.5 - 8.1 g/dL Final  . Albumin 04/25/2015 4.0  3.5 - 5.0 g/dL Final  . AST 04/25/2015 25  15 - 41 U/L Final  . ALT 04/25/2015 21  17 - 63 U/L Final  . Alkaline Phosphatase 04/25/2015 64  38 - 126 U/L Final  . Total Bilirubin 04/25/2015 0.8  0.3 - 1.2 mg/dL Final  . GFR calc non Af Amer 04/25/2015 46* >60 mL/min Final  . GFR calc Af Amer 04/25/2015 53* >60 mL/min Final   Comment: (NOTE) The eGFR  has been calculated using the CKD EPI equation. This calculation has not been validated in all clinical situations. eGFR's persistently <60 mL/min signify possible Chronic Kidney Disease.   . Anion gap 04/25/2015 8  5 - 15 Final  . ABO/RH(D)  04/25/2015 A NEG   Final  . Antibody Screen 04/25/2015 NEG   Final  . Sample Expiration 04/25/2015 04/28/2015   Final    Assessment:  TAREN TOOPS is a 80 y.o. male with recurrent hairy cell leukemia.  He was diagnosed with hairy cell leukemia in 2006.  He received cladribine 11/06-11/05/2005.  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  Bone marrow aspirate and biopsy on 02/07/2015 revealed a extensive marrow involvement by recurrent hairy cell leukemia (approximately 80% of cells in the core). There was a monoclonal plasma cell infiltrate (approximately 10%), compatible with a plasma cell neoplasm. Marrow was hypercellular (40-50%) with markedly diminished residual trilineage hematopoiesis and mild multilineage dyspoiesis.  Peripheral smear revealed leukopenia (WBC 1900) with 2% atypical lymphoid cells compatible with hairy cell leukemia. There was moderate anemia with anisopoikilocytosis. 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  FISH studies for MDS were negative.  Cytogenetics were normal (46, XY).  He has an unclear allergy history.  He may be allergic to Primaxin, Zoloft, allopurinol, and a chemotherapy medication.  Reaction occurred in 06/2005.  He is allergic to sulfa.  He is not allergic to dapsone.  He requires washed RBCs.  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  He completed a course of Levaquin for pneumonia.  He is on prophylactic acyclovir, dapsone, and voriconazole.  Voriconazole is costly.  He is currently day 54 status post cladribine (began 03/03/2015).    Symptomatically, his energy level is good.  Exam is stable.  White count has improved to 2700 (ANC 1700).  Hematocrit is 26.7 and platelets are 124,000.  Creatinine is 1.42 (CrCl 46 ml/min).  His blood sugar is elevated.  Plan: 1. Labs today:  CBC with diff, CMP, type and hold. 2. Discuss improving  counts.  If [place] continues to improve, anticipate discontinuation of voriconazole in the next 2-4 weeks. 3. Discuss blood sugar. 4. Return to work note. 5. Discuss fever and neutropenia precautions. 6. RTC weekly for CBC with diff and type and hold. 7. RTC in 1 month for MD assessment and labs (CBC with diff, CMP, type and hold).   Lequita Asal, MD  04/25/2015, 3:07 PM

## 2015-04-25 NOTE — Progress Notes (Signed)
Patient here for follow up today. Patient concerned about low HGB.

## 2015-04-30 ENCOUNTER — Encounter: Payer: Self-pay | Admitting: Hematology and Oncology

## 2015-05-02 ENCOUNTER — Inpatient Hospital Stay: Payer: Medicare Other

## 2015-05-02 DIAGNOSIS — Z8701 Personal history of pneumonia (recurrent): Secondary | ICD-10-CM | POA: Diagnosis not present

## 2015-05-02 DIAGNOSIS — R0602 Shortness of breath: Secondary | ICD-10-CM | POA: Diagnosis not present

## 2015-05-02 DIAGNOSIS — C9140 Hairy cell leukemia not having achieved remission: Secondary | ICD-10-CM

## 2015-05-02 DIAGNOSIS — Z794 Long term (current) use of insulin: Secondary | ICD-10-CM | POA: Diagnosis not present

## 2015-05-02 DIAGNOSIS — E785 Hyperlipidemia, unspecified: Secondary | ICD-10-CM | POA: Diagnosis not present

## 2015-05-02 DIAGNOSIS — E538 Deficiency of other specified B group vitamins: Secondary | ICD-10-CM | POA: Diagnosis not present

## 2015-05-02 DIAGNOSIS — K5790 Diverticulosis of intestine, part unspecified, without perforation or abscess without bleeding: Secondary | ICD-10-CM | POA: Diagnosis not present

## 2015-05-02 DIAGNOSIS — N400 Enlarged prostate without lower urinary tract symptoms: Secondary | ICD-10-CM | POA: Diagnosis not present

## 2015-05-02 DIAGNOSIS — I10 Essential (primary) hypertension: Secondary | ICD-10-CM | POA: Diagnosis not present

## 2015-05-02 DIAGNOSIS — D61818 Other pancytopenia: Secondary | ICD-10-CM | POA: Diagnosis not present

## 2015-05-02 DIAGNOSIS — Z87891 Personal history of nicotine dependence: Secondary | ICD-10-CM | POA: Diagnosis not present

## 2015-05-02 DIAGNOSIS — Z79899 Other long term (current) drug therapy: Secondary | ICD-10-CM | POA: Diagnosis not present

## 2015-05-02 DIAGNOSIS — E119 Type 2 diabetes mellitus without complications: Secondary | ICD-10-CM | POA: Diagnosis not present

## 2015-05-02 DIAGNOSIS — K219 Gastro-esophageal reflux disease without esophagitis: Secondary | ICD-10-CM | POA: Diagnosis not present

## 2015-05-02 DIAGNOSIS — Z85828 Personal history of other malignant neoplasm of skin: Secondary | ICD-10-CM | POA: Diagnosis not present

## 2015-05-02 DIAGNOSIS — Z7982 Long term (current) use of aspirin: Secondary | ICD-10-CM | POA: Diagnosis not present

## 2015-05-02 DIAGNOSIS — Z86010 Personal history of colonic polyps: Secondary | ICD-10-CM | POA: Diagnosis not present

## 2015-05-02 LAB — CBC WITH DIFFERENTIAL/PLATELET
Basophils Absolute: 0 10*3/uL (ref 0–0.1)
Basophils Relative: 1 %
Eosinophils Absolute: 0.2 10*3/uL (ref 0–0.7)
Eosinophils Relative: 9 %
HCT: 24.9 % — ABNORMAL LOW (ref 40.0–52.0)
Hemoglobin: 8.3 g/dL — ABNORMAL LOW (ref 13.0–18.0)
Lymphocytes Relative: 24 %
Lymphs Abs: 0.6 10*3/uL — ABNORMAL LOW (ref 1.0–3.6)
MCH: 37 pg — ABNORMAL HIGH (ref 26.0–34.0)
MCHC: 33.5 g/dL (ref 32.0–36.0)
MCV: 110.6 fL — ABNORMAL HIGH (ref 80.0–100.0)
Monocytes Absolute: 0.3 10*3/uL (ref 0.2–1.0)
Monocytes Relative: 13 %
Neutro Abs: 1.2 10*3/uL — ABNORMAL LOW (ref 1.4–6.5)
Neutrophils Relative %: 53 %
Platelets: 108 10*3/uL — ABNORMAL LOW (ref 150–440)
RBC: 2.25 MIL/uL — ABNORMAL LOW (ref 4.40–5.90)
RDW: 17.9 % — ABNORMAL HIGH (ref 11.5–14.5)
WBC: 2.3 10*3/uL — ABNORMAL LOW (ref 3.8–10.6)

## 2015-05-02 LAB — SAMPLE TO BLOOD BANK

## 2015-05-09 ENCOUNTER — Inpatient Hospital Stay: Payer: Medicare Other

## 2015-05-09 DIAGNOSIS — E785 Hyperlipidemia, unspecified: Secondary | ICD-10-CM | POA: Diagnosis not present

## 2015-05-09 DIAGNOSIS — Z86010 Personal history of colonic polyps: Secondary | ICD-10-CM | POA: Diagnosis not present

## 2015-05-09 DIAGNOSIS — I10 Essential (primary) hypertension: Secondary | ICD-10-CM | POA: Diagnosis not present

## 2015-05-09 DIAGNOSIS — C9140 Hairy cell leukemia not having achieved remission: Secondary | ICD-10-CM

## 2015-05-09 DIAGNOSIS — R0602 Shortness of breath: Secondary | ICD-10-CM | POA: Diagnosis not present

## 2015-05-09 DIAGNOSIS — D61818 Other pancytopenia: Secondary | ICD-10-CM | POA: Diagnosis not present

## 2015-05-09 DIAGNOSIS — E538 Deficiency of other specified B group vitamins: Secondary | ICD-10-CM | POA: Diagnosis not present

## 2015-05-09 DIAGNOSIS — K219 Gastro-esophageal reflux disease without esophagitis: Secondary | ICD-10-CM | POA: Diagnosis not present

## 2015-05-09 DIAGNOSIS — E119 Type 2 diabetes mellitus without complications: Secondary | ICD-10-CM | POA: Diagnosis not present

## 2015-05-09 DIAGNOSIS — Z794 Long term (current) use of insulin: Secondary | ICD-10-CM | POA: Diagnosis not present

## 2015-05-09 DIAGNOSIS — Z79899 Other long term (current) drug therapy: Secondary | ICD-10-CM | POA: Diagnosis not present

## 2015-05-09 DIAGNOSIS — Z8701 Personal history of pneumonia (recurrent): Secondary | ICD-10-CM | POA: Diagnosis not present

## 2015-05-09 DIAGNOSIS — Z7982 Long term (current) use of aspirin: Secondary | ICD-10-CM | POA: Diagnosis not present

## 2015-05-09 DIAGNOSIS — Z87891 Personal history of nicotine dependence: Secondary | ICD-10-CM | POA: Diagnosis not present

## 2015-05-09 DIAGNOSIS — N400 Enlarged prostate without lower urinary tract symptoms: Secondary | ICD-10-CM | POA: Diagnosis not present

## 2015-05-09 DIAGNOSIS — K5790 Diverticulosis of intestine, part unspecified, without perforation or abscess without bleeding: Secondary | ICD-10-CM | POA: Diagnosis not present

## 2015-05-09 DIAGNOSIS — Z85828 Personal history of other malignant neoplasm of skin: Secondary | ICD-10-CM | POA: Diagnosis not present

## 2015-05-09 LAB — CBC WITH DIFFERENTIAL/PLATELET
Basophils Absolute: 0 10*3/uL (ref 0–0.1)
Basophils Relative: 1 %
Eosinophils Absolute: 0.2 10*3/uL (ref 0–0.7)
Eosinophils Relative: 7 %
HCT: 25.2 % — ABNORMAL LOW (ref 40.0–52.0)
Hemoglobin: 8.5 g/dL — ABNORMAL LOW (ref 13.0–18.0)
Lymphocytes Relative: 24 %
Lymphs Abs: 0.8 10*3/uL — ABNORMAL LOW (ref 1.0–3.6)
MCH: 37.7 pg — ABNORMAL HIGH (ref 26.0–34.0)
MCHC: 33.7 g/dL (ref 32.0–36.0)
MCV: 111.8 fL — ABNORMAL HIGH (ref 80.0–100.0)
Monocytes Absolute: 0.3 10*3/uL (ref 0.2–1.0)
Monocytes Relative: 10 %
Neutro Abs: 1.8 10*3/uL (ref 1.4–6.5)
Neutrophils Relative %: 58 %
Platelets: 105 10*3/uL — ABNORMAL LOW (ref 150–440)
RBC: 2.25 MIL/uL — ABNORMAL LOW (ref 4.40–5.90)
RDW: 17.1 % — ABNORMAL HIGH (ref 11.5–14.5)
WBC: 3.1 10*3/uL — ABNORMAL LOW (ref 3.8–10.6)

## 2015-05-09 LAB — SAMPLE TO BLOOD BANK

## 2015-05-14 ENCOUNTER — Other Ambulatory Visit: Payer: Self-pay | Admitting: Family Medicine

## 2015-05-14 DIAGNOSIS — C9140 Hairy cell leukemia not having achieved remission: Secondary | ICD-10-CM

## 2015-05-14 DIAGNOSIS — E119 Type 2 diabetes mellitus without complications: Secondary | ICD-10-CM

## 2015-05-14 DIAGNOSIS — E785 Hyperlipidemia, unspecified: Secondary | ICD-10-CM

## 2015-05-15 ENCOUNTER — Other Ambulatory Visit (INDEPENDENT_AMBULATORY_CARE_PROVIDER_SITE_OTHER): Payer: Medicare Other

## 2015-05-15 DIAGNOSIS — E119 Type 2 diabetes mellitus without complications: Secondary | ICD-10-CM

## 2015-05-15 DIAGNOSIS — E785 Hyperlipidemia, unspecified: Secondary | ICD-10-CM | POA: Diagnosis not present

## 2015-05-15 LAB — RENAL FUNCTION PANEL
Albumin: 3.8 g/dL (ref 3.5–5.2)
BUN: 23 mg/dL (ref 6–23)
CALCIUM: 9.1 mg/dL (ref 8.4–10.5)
CHLORIDE: 107 meq/L (ref 96–112)
CO2: 24 meq/L (ref 19–32)
Creatinine, Ser: 1.2 mg/dL (ref 0.40–1.50)
GFR: 62.15 mL/min (ref >60.00)
Glucose, Bld: 171 mg/dL — ABNORMAL HIGH (ref 70–99)
POTASSIUM: 4.6 meq/L (ref 3.5–5.1)
Phosphorus: 2.7 mg/dL (ref 2.3–4.6)
Sodium: 137 mEq/L (ref 135–145)

## 2015-05-15 LAB — LIPID PANEL
Cholesterol: 125 mg/dL (ref 0–200)
HDL: 26 mg/dL — ABNORMAL LOW (ref >39.00)
NonHDL: 99.46
Total CHOL/HDL Ratio: 5
Triglycerides: 376 mg/dL — ABNORMAL HIGH (ref 0.0–149.0)
VLDL: 75.2 mg/dL — ABNORMAL HIGH (ref 0.0–40.0)

## 2015-05-15 LAB — LDL CHOLESTEROL, DIRECT: LDL DIRECT: 52 mg/dL

## 2015-05-15 LAB — HEMOGLOBIN A1C: HEMOGLOBIN A1C: 4.8 % (ref 4.6–6.5)

## 2015-05-16 ENCOUNTER — Inpatient Hospital Stay: Payer: Medicare Other

## 2015-05-16 DIAGNOSIS — R0602 Shortness of breath: Secondary | ICD-10-CM | POA: Diagnosis not present

## 2015-05-16 DIAGNOSIS — E785 Hyperlipidemia, unspecified: Secondary | ICD-10-CM | POA: Diagnosis not present

## 2015-05-16 DIAGNOSIS — D61818 Other pancytopenia: Secondary | ICD-10-CM | POA: Diagnosis not present

## 2015-05-16 DIAGNOSIS — E119 Type 2 diabetes mellitus without complications: Secondary | ICD-10-CM | POA: Diagnosis not present

## 2015-05-16 DIAGNOSIS — N400 Enlarged prostate without lower urinary tract symptoms: Secondary | ICD-10-CM | POA: Diagnosis not present

## 2015-05-16 DIAGNOSIS — E538 Deficiency of other specified B group vitamins: Secondary | ICD-10-CM | POA: Diagnosis not present

## 2015-05-16 DIAGNOSIS — K5790 Diverticulosis of intestine, part unspecified, without perforation or abscess without bleeding: Secondary | ICD-10-CM | POA: Diagnosis not present

## 2015-05-16 DIAGNOSIS — Z87891 Personal history of nicotine dependence: Secondary | ICD-10-CM | POA: Diagnosis not present

## 2015-05-16 DIAGNOSIS — C9140 Hairy cell leukemia not having achieved remission: Secondary | ICD-10-CM

## 2015-05-16 DIAGNOSIS — Z85828 Personal history of other malignant neoplasm of skin: Secondary | ICD-10-CM | POA: Diagnosis not present

## 2015-05-16 DIAGNOSIS — Z86010 Personal history of colonic polyps: Secondary | ICD-10-CM | POA: Diagnosis not present

## 2015-05-16 DIAGNOSIS — I10 Essential (primary) hypertension: Secondary | ICD-10-CM | POA: Diagnosis not present

## 2015-05-16 DIAGNOSIS — Z79899 Other long term (current) drug therapy: Secondary | ICD-10-CM | POA: Diagnosis not present

## 2015-05-16 DIAGNOSIS — Z7982 Long term (current) use of aspirin: Secondary | ICD-10-CM | POA: Diagnosis not present

## 2015-05-16 DIAGNOSIS — Z8701 Personal history of pneumonia (recurrent): Secondary | ICD-10-CM | POA: Diagnosis not present

## 2015-05-16 DIAGNOSIS — Z794 Long term (current) use of insulin: Secondary | ICD-10-CM | POA: Diagnosis not present

## 2015-05-16 DIAGNOSIS — K219 Gastro-esophageal reflux disease without esophagitis: Secondary | ICD-10-CM | POA: Diagnosis not present

## 2015-05-16 LAB — CBC WITH DIFFERENTIAL/PLATELET
Basophils Absolute: 0 10*3/uL (ref 0–0.1)
Basophils Relative: 1 %
Eosinophils Absolute: 0.3 10*3/uL (ref 0–0.7)
Eosinophils Relative: 9 %
HCT: 26.1 % — ABNORMAL LOW (ref 40.0–52.0)
Hemoglobin: 8.7 g/dL — ABNORMAL LOW (ref 13.0–18.0)
Lymphocytes Relative: 21 %
Lymphs Abs: 0.8 10*3/uL — ABNORMAL LOW (ref 1.0–3.6)
MCH: 37.8 pg — ABNORMAL HIGH (ref 26.0–34.0)
MCHC: 33.3 g/dL (ref 32.0–36.0)
MCV: 113.5 fL — ABNORMAL HIGH (ref 80.0–100.0)
Monocytes Absolute: 0.3 10*3/uL (ref 0.2–1.0)
Monocytes Relative: 9 %
Neutro Abs: 2.3 10*3/uL (ref 1.4–6.5)
Neutrophils Relative %: 60 %
Platelets: 108 10*3/uL — ABNORMAL LOW (ref 150–440)
RBC: 2.3 MIL/uL — ABNORMAL LOW (ref 4.40–5.90)
RDW: 16.9 % — ABNORMAL HIGH (ref 11.5–14.5)
WBC: 3.7 10*3/uL — ABNORMAL LOW (ref 3.8–10.6)

## 2015-05-16 LAB — SAMPLE TO BLOOD BANK

## 2015-05-16 NOTE — Addendum Note (Signed)
Addended by: Ellamae Sia on: 05/16/2015 09:05 AM   Modules accepted: Orders

## 2015-05-22 ENCOUNTER — Ambulatory Visit (INDEPENDENT_AMBULATORY_CARE_PROVIDER_SITE_OTHER): Payer: Medicare Other | Admitting: Family Medicine

## 2015-05-22 ENCOUNTER — Encounter: Payer: Self-pay | Admitting: Family Medicine

## 2015-05-22 VITALS — BP 132/66 | HR 88 | Temp 98.4°F | Ht 67.75 in | Wt 218.5 lb

## 2015-05-22 DIAGNOSIS — Z7189 Other specified counseling: Secondary | ICD-10-CM

## 2015-05-22 DIAGNOSIS — Z794 Long term (current) use of insulin: Secondary | ICD-10-CM

## 2015-05-22 DIAGNOSIS — Z23 Encounter for immunization: Secondary | ICD-10-CM

## 2015-05-22 DIAGNOSIS — K219 Gastro-esophageal reflux disease without esophagitis: Secondary | ICD-10-CM

## 2015-05-22 DIAGNOSIS — Z0000 Encounter for general adult medical examination without abnormal findings: Secondary | ICD-10-CM

## 2015-05-22 DIAGNOSIS — I10 Essential (primary) hypertension: Secondary | ICD-10-CM

## 2015-05-22 DIAGNOSIS — N400 Enlarged prostate without lower urinary tract symptoms: Secondary | ICD-10-CM

## 2015-05-22 DIAGNOSIS — E538 Deficiency of other specified B group vitamins: Secondary | ICD-10-CM

## 2015-05-22 DIAGNOSIS — E669 Obesity, unspecified: Secondary | ICD-10-CM

## 2015-05-22 DIAGNOSIS — C9142 Hairy cell leukemia, in relapse: Secondary | ICD-10-CM

## 2015-05-22 DIAGNOSIS — E785 Hyperlipidemia, unspecified: Secondary | ICD-10-CM

## 2015-05-22 DIAGNOSIS — E119 Type 2 diabetes mellitus without complications: Secondary | ICD-10-CM

## 2015-05-22 LAB — FRUCTOSAMINE: FRUCTOSAMINE: 240 umol/L (ref 190–270)

## 2015-05-22 NOTE — Assessment & Plan Note (Addendum)
Body mass index is 33.46 kg/(m^2).

## 2015-05-22 NOTE — Assessment & Plan Note (Signed)
Continue oral b12.   

## 2015-05-22 NOTE — Assessment & Plan Note (Signed)
Advanced directives: discussed, has not set up, packet provided last visit. Would want wife then son to be HCPOA. Full code, ok for temporary measures but doesn't want prolonged life support. Is organ donor.

## 2015-05-22 NOTE — Assessment & Plan Note (Signed)
Preventative protocols reviewed and updated unless pt declined. Discussed healthy diet and lifestyle.  

## 2015-05-22 NOTE — Progress Notes (Signed)
BP 132/66 mmHg  Pulse 88  Temp(Src) 98.4 F (36.9 C) (Oral)  Ht 5' 7.75" (1.721 m)  Wt 218 lb 8 oz (99.111 kg)  BMI 33.46 kg/m2   CC: medicare wellness visit  Subjective:    Patient ID: Alex Smith, male    DOB: Nov 06, 1934, 80 y.o.   MRN: [id number]  HPI: Alex Smith is a 80 y.o. male presenting on 05/22/2015 for Annual Exam   Hairy Cell Leukemia - followed by Dr. [name] at [facility] onc. On chemo for 5 years. Relapsed 01/2015 treated with cladribine (02/2015) and has responded very well. Close monitoring of blood counts. R chest wall port in place.  Passes hearing Recent eye exam 10/2014 No falls in last year. No depression  Preventative:  Colonoscopy 2014 - Dr. Tiffany Kocher at [facility]. Told didn't need colonoscopy repeated Prostate cancer screening - aged out. Released from Dr. Dene Gentry care for BPH as he was doing well. Denies urinary issues.  Pneumovax 2006, prevnar today. Flu shot - today  Td 1997  zostavax - declines. Currently on acyclovir. Would want to check with onc re: this. Advanced directives: discussed, has not set up, packet provided last visit. Would want wife then son to be HCPOA. Full code, ok for temporary measures but doesn't want prolonged life support. Is organ donor.  Seat belt use discussed Sunscreen use discussed. No changing moles on skin.  Married >50 yrs, cares for wife. 1 cat  Occupation: was Art gallery manager, still works for [employer]  Activity: likes to golf  Diet: moderate water, fruits/vegetables daily   Relevant past medical, surgical, family and social history reviewed and updated as indicated. Interim medical history since our last visit reviewed. Allergies and medications reviewed and updated. Current Outpatient Prescriptions on File Prior to Visit  Medication Sig  . acyclovir (ZOVIRAX) 400 MG tablet Take 800 mg by mouth 2 (two) times daily.  Marland Kitchen aspirin EC 81 MG tablet Take 81 mg by mouth daily.  . dapsone 100 MG tablet Take by mouth.  Marland Kitchen  glucose blood (ACCU-CHEK AVIVA PLUS) test strip Use to check sugar twice daily Dx:E11.9  . hydrochlorothiazide (HYDRODIURIL) 25 MG tablet Take 1 tablet (25 mg total) by mouth daily.  . insulin NPH Human (HUMULIN N) 100 UNIT/ML injection Inject 0.35 mLs (35 Units total) into the skin 2 (two) times daily before a meal.  . insulin regular (HUMULIN R) 100 units/mL injection Inject 0.1 mLs (10 Units total) into the skin 2 (two) times daily before a meal.  . lidocaine-prilocaine (EMLA) cream Apply 1 application topically as needed.  Marland Kitchen lisinopril (PRINIVIL,ZESTRIL) 20 MG tablet TAKE ONE TABLET BY MOUTH TWICE DAILY  . metFORMIN (GLUCOPHAGE) 1000 MG tablet TAKE ONE TABLET BY MOUTH TWICE DAILY AS DIRECTED  . MICROLET LANCETS MISC USE AS DIRECTED  . omeprazole (PRILOSEC) 20 MG capsule Take 20 mg by mouth daily.   . pravastatin (PRAVACHOL) 20 MG tablet TAKE ONE TABLET AT BEDTIME  . tamsulosin (FLOMAX) 0.4 MG CAPS capsule Take 0.4 mg by mouth daily.  . vitamin B-12 (CYANOCOBALAMIN) 1000 MCG tablet Take 1,000 mcg by mouth daily.  . vitamin E 400 UNIT capsule Take 200 Units by mouth daily.   . ondansetron (ZOFRAN) 8 MG tablet Take 1 tablet (8 mg total) by mouth every 8 (eight) hours as needed for nausea or vomiting. (Patient not taking: Reported on 05/22/2015)   Current Facility-Administered Medications on File Prior to Visit  Medication  . heparin lock flush 100 unit/mL  .  sodium chloride 0.9 % injection 10 mL  . sodium chloride 0.9 % injection 10 mL    Review of Systems  Constitutional: Negative for fever, chills, activity change, appetite change, fatigue and unexpected weight change.  HENT: Negative for hearing loss.   Eyes: Negative for visual disturbance.  Respiratory: Positive for shortness of breath. Negative for cough, chest tightness and wheezing.   Cardiovascular: Negative for chest pain, palpitations and leg swelling.  Gastrointestinal: Negative for nausea, vomiting, abdominal pain, diarrhea,  constipation, blood in stool and abdominal distention.  Genitourinary: Negative for hematuria and difficulty urinating.  Musculoskeletal: Negative for myalgias, arthralgias and neck pain.  Skin: Negative for rash.  Neurological: Positive for dizziness. Negative for seizures, syncope and headaches.  Hematological: Negative for adenopathy. Bruises/bleeds easily.  Psychiatric/Behavioral: Negative for dysphoric mood. The patient is not nervous/anxious.    Per HPI unless specifically indicated above     Objective:    BP 132/66 mmHg  Pulse 88  Temp(Src) 98.4 F (36.9 C) (Oral)  Ht 5' 7.75" (1.721 m)  Wt 218 lb 8 oz (99.111 kg)  BMI 33.46 kg/m2  Wt Readings from Last 3 Encounters:  05/22/15 218 lb 8 oz (99.111 kg)  04/25/15 215 lb 15.1 oz (97.95 kg)  04/11/15 220 lb 0.3 oz (99.8 kg)    Physical Exam  Constitutional: He is oriented to person, place, and time. He appears well-developed and well-nourished. No distress.  HENT:  Head: Normocephalic and atraumatic.  Right Ear: Hearing, tympanic membrane, external ear and ear canal normal.  Left Ear: Hearing, tympanic membrane, external ear and ear canal normal.  Nose: Nose normal.  Mouth/Throat: Uvula is midline, oropharynx is clear and moist and mucous membranes are normal. No oropharyngeal exudate, posterior oropharyngeal edema or posterior oropharyngeal erythema.  Eyes: Conjunctivae and EOM are normal. Pupils are equal, round, and reactive to light. No scleral icterus.  Neck: Normal range of motion. Neck supple. Carotid bruit is not present. No thyromegaly present.  Cardiovascular: Normal rate, regular rhythm, normal heart sounds and intact distal pulses.   No murmur heard. Pulses:      Radial pulses are 2+ on the right side, and 2+ on the left side.  Pulmonary/Chest: Effort normal and breath sounds normal. No respiratory distress. He has no wheezes. He has no rales.  Abdominal: Soft. Bowel sounds are normal. He exhibits no distension  and no mass. There is no tenderness. There is no rebound and no guarding.  Musculoskeletal: Normal range of motion. He exhibits no edema.  Lymphadenopathy:    He has no cervical adenopathy.  Neurological: He is alert and oriented to person, place, and time.  CN grossly intact, station and gait intact Recall 3/3 Calculation 5/5 serial 7s   Skin: Skin is warm and dry. No rash noted.  Psychiatric: He has a normal mood and affect. His behavior is normal. Judgment and thought content normal.  Nursing note and vitals reviewed.  Results for orders placed or performed in visit on 05/16/15  CBC with Differential  Result Value Ref Range   WBC 3.7 (L) 3.8 - 10.6 K/uL   RBC 2.30 (L) 4.40 - 5.90 MIL/uL   Hemoglobin 8.7 (L) 13.0 - 18.0 g/dL   HCT 26.1 (L) 40.0 - 52.0 %   MCV 113.5 (H) 80.0 - 100.0 fL   MCH 37.8 (H) 26.0 - 34.0 pg   MCHC 33.3 32.0 - 36.0 g/dL   RDW 16.9 (H) 11.5 - 14.5 %   Platelets 108 (L) 150 -  440 K/uL   Neutrophils Relative % 60% %   Neutro Abs 2.3 1.4 - 6.5 K/uL   Lymphocytes Relative 21% %   Lymphs Abs 0.8 (L) 1.0 - 3.6 K/uL   Monocytes Relative 9% %   Monocytes Absolute 0.3 0.2 - 1.0 K/uL   Eosinophils Relative 9% %   Eosinophils Absolute 0.3 0 - 0.7 K/uL   Basophils Relative 1% %   Basophils Absolute 0.0 0 - 0.1 K/uL  Hold Tube- Blood Bank  Result Value Ref Range   Blood Bank Specimen SAMPLE AVAILABLE FOR TESTING    Sample Expiration 05/19/2015       Assessment & Plan:   Problem List Items Addressed This Visit    Type II diabetes mellitus (HCC)    Chronic, stable. Goal A1c <8%. A1c not reliable given leukemia. Fructosamine level stable. Continue to monitor.      Obesity, Class I, BMI 30-34.9    Body mass index is 33.46 kg/(m^2).       Medicare annual wellness visit, subsequent - Primary    I have personally reviewed the Medicare Annual Wellness questionnaire and have noted 1. The patient's medical and social history 2. Their use of alcohol, tobacco or  illicit drugs 3. Their current medications and supplements 4. The patient's functional ability including ADL's, fall risks, home safety risks and hearing or visual impairment. Cognitive function has been assessed and addressed as indicated.  5. Diet and physical activity 6. Evidence for depression or mood disorders The patients weight, height, BMI have been recorded in the chart. I have made referrals, counseling and provided education to the patient based on review of the above and I have provided the pt with a written personalized care plan for preventive services. Provider list updated.. See scanned questionairre as needed for further documentation. Reviewed preventative protocols and updated unless pt declined.       Health maintenance examination    Preventative protocols reviewed and updated unless pt declined. Discussed healthy diet and lifestyle.       Hairy cell leukemia ([place])    Followed by Dr [name]. Doing remarkably well.      GERD (gastroesophageal reflux disease)    Chronic, stable on omeprazole 20mg  daily.      Essential hypertension    Chronic, stable. Continue current regimen.      Dyslipidemia    Continue pravastatin. Discussed diet changes to affect lower trig levels.      BPH (benign prostatic hypertrophy)    asxs.      B12 deficiency    Continue oral b12      Advanced care planning/counseling discussion    Advanced directives: discussed, has not set up, packet provided last visit. Would want wife then son to be HCPOA. Full code, ok for temporary measures but doesn't want prolonged life support. Is organ donor.        Other Visit Diagnoses    Need for influenza vaccination        Relevant Orders    Flu Vaccine QUAD 36+ mos PF IM (Fluarix & Fluzone Quad PF) (Completed)        Follow up plan: Return in about 6 months (around 11/20/2015), or as needed, for follow up visit.

## 2015-05-22 NOTE — Progress Notes (Signed)
Pre visit review using our clinic review tool, if applicable. No additional management support is needed unless otherwise documented below in the visit note. 

## 2015-05-22 NOTE — Assessment & Plan Note (Signed)
Continue pravastatin. Discussed diet changes to affect lower trig levels.

## 2015-05-22 NOTE — Patient Instructions (Addendum)
Flu shot today. You are doing well.  Return as needed or in 6 months for diabetes check.

## 2015-05-22 NOTE — Assessment & Plan Note (Signed)
Chronic, stable. Goal A1c <8%. A1c not reliable given leukemia. Fructosamine level stable. Continue to monitor.

## 2015-05-22 NOTE — Assessment & Plan Note (Signed)
Chronic, stable on omeprazole 20mg daily.  

## 2015-05-22 NOTE — Assessment & Plan Note (Signed)

## 2015-05-22 NOTE — Assessment & Plan Note (Addendum)
Followed by Dr [name]. Doing remarkably well.

## 2015-05-22 NOTE — Assessment & Plan Note (Signed)
Chronic, stable. Continue current regimen. 

## 2015-05-22 NOTE — Assessment & Plan Note (Signed)
asxs. 

## 2015-05-23 ENCOUNTER — Inpatient Hospital Stay: Payer: Medicare Other

## 2015-05-23 ENCOUNTER — Inpatient Hospital Stay: Payer: Medicare Other | Attending: Hematology and Oncology | Admitting: Hematology and Oncology

## 2015-05-23 ENCOUNTER — Other Ambulatory Visit: Payer: Medicare Other

## 2015-05-23 VITALS — BP 159/75 | HR 88 | Temp 98.2°F | Ht 67.0 in | Wt 217.8 lb

## 2015-05-23 DIAGNOSIS — C9142 Hairy cell leukemia, in relapse: Secondary | ICD-10-CM

## 2015-05-23 DIAGNOSIS — Z79899 Other long term (current) drug therapy: Secondary | ICD-10-CM | POA: Diagnosis not present

## 2015-05-23 DIAGNOSIS — D539 Nutritional anemia, unspecified: Secondary | ICD-10-CM

## 2015-05-23 DIAGNOSIS — Z7982 Long term (current) use of aspirin: Secondary | ICD-10-CM

## 2015-05-23 DIAGNOSIS — Z87891 Personal history of nicotine dependence: Secondary | ICD-10-CM

## 2015-05-23 DIAGNOSIS — Z8701 Personal history of pneumonia (recurrent): Secondary | ICD-10-CM | POA: Diagnosis not present

## 2015-05-23 DIAGNOSIS — D472 Monoclonal gammopathy: Secondary | ICD-10-CM

## 2015-05-23 DIAGNOSIS — N400 Enlarged prostate without lower urinary tract symptoms: Secondary | ICD-10-CM | POA: Diagnosis not present

## 2015-05-23 DIAGNOSIS — I10 Essential (primary) hypertension: Secondary | ICD-10-CM | POA: Diagnosis not present

## 2015-05-23 DIAGNOSIS — E538 Deficiency of other specified B group vitamins: Secondary | ICD-10-CM | POA: Diagnosis not present

## 2015-05-23 DIAGNOSIS — Z8719 Personal history of other diseases of the digestive system: Secondary | ICD-10-CM | POA: Diagnosis not present

## 2015-05-23 DIAGNOSIS — K219 Gastro-esophageal reflux disease without esophagitis: Secondary | ICD-10-CM

## 2015-05-23 DIAGNOSIS — C9140 Hairy cell leukemia not having achieved remission: Secondary | ICD-10-CM | POA: Insufficient documentation

## 2015-05-23 DIAGNOSIS — E785 Hyperlipidemia, unspecified: Secondary | ICD-10-CM | POA: Diagnosis not present

## 2015-05-23 DIAGNOSIS — D61818 Other pancytopenia: Secondary | ICD-10-CM

## 2015-05-23 DIAGNOSIS — E119 Type 2 diabetes mellitus without complications: Secondary | ICD-10-CM

## 2015-05-23 DIAGNOSIS — Z794 Long term (current) use of insulin: Secondary | ICD-10-CM | POA: Insufficient documentation

## 2015-05-23 DIAGNOSIS — Z85828 Personal history of other malignant neoplasm of skin: Secondary | ICD-10-CM | POA: Diagnosis not present

## 2015-05-23 LAB — COMPREHENSIVE METABOLIC PANEL
ALT: 26 U/L (ref 17–63)
AST: 32 U/L (ref 15–41)
Albumin: 3.9 g/dL (ref 3.5–5.0)
Alkaline Phosphatase: 59 U/L (ref 38–126)
Anion gap: 3 — ABNORMAL LOW (ref 5–15)
BUN: 23 mg/dL — ABNORMAL HIGH (ref 6–20)
CO2: 24 mmol/L (ref 22–32)
Calcium: 8.2 mg/dL — ABNORMAL LOW (ref 8.9–10.3)
Chloride: 108 mmol/L (ref 101–111)
Creatinine, Ser: 1.35 mg/dL — ABNORMAL HIGH (ref 0.61–1.24)
GFR calc Af Amer: 56 mL/min — ABNORMAL LOW (ref >60)
GFR calc non Af Amer: 49 mL/min — ABNORMAL LOW (ref >60)
Glucose, Bld: 205 mg/dL — ABNORMAL HIGH (ref 65–99)
Potassium: 4 mmol/L (ref 3.5–5.1)
Sodium: 135 mmol/L (ref 135–145)
Total Bilirubin: 0.6 mg/dL (ref 0.3–1.2)
Total Protein: 7.4 g/dL (ref 6.5–8.1)

## 2015-05-23 LAB — CBC WITH DIFFERENTIAL/PLATELET
Basophils Absolute: 0 10*3/uL (ref 0–0.1)
Basophils Relative: 1 %
Eosinophils Absolute: 0.2 10*3/uL (ref 0–0.7)
Eosinophils Relative: 7 %
HCT: 25.6 % — ABNORMAL LOW (ref 40.0–52.0)
Hemoglobin: 8.7 g/dL — ABNORMAL LOW (ref 13.0–18.0)
Lymphocytes Relative: 26 %
Lymphs Abs: 0.7 10*3/uL — ABNORMAL LOW (ref 1.0–3.6)
MCH: 37.8 pg — ABNORMAL HIGH (ref 26.0–34.0)
MCHC: 33.8 g/dL (ref 32.0–36.0)
MCV: 111.9 fL — ABNORMAL HIGH (ref 80.0–100.0)
Monocytes Absolute: 0.2 10*3/uL (ref 0.2–1.0)
Monocytes Relative: 8 %
Neutro Abs: 1.6 10*3/uL (ref 1.4–6.5)
Neutrophils Relative %: 58 %
Platelets: 107 10*3/uL — ABNORMAL LOW (ref 150–440)
RBC: 2.29 MIL/uL — ABNORMAL LOW (ref 4.40–5.90)
RDW: 16.1 % — ABNORMAL HIGH (ref 11.5–14.5)
WBC: 2.8 10*3/uL — ABNORMAL LOW (ref 3.8–10.6)

## 2015-05-23 LAB — SAMPLE TO BLOOD BANK

## 2015-05-23 MED ORDER — SODIUM CHLORIDE 0.9 % IJ SOLN
10.0000 mL | INTRAMUSCULAR | Status: DC | PRN
  Administered 2015-05-23: 10 mL via INTRAVENOUS
  Filled 2015-05-23: qty 10

## 2015-05-23 MED ORDER — HEPARIN SOD (PORK) LOCK FLUSH 100 UNIT/ML IV SOLN
500.0000 [IU] | Freq: Once | INTRAVENOUS | Status: AC
  Administered 2015-05-23: 500 [IU] via INTRAVENOUS
  Filled 2015-05-23: qty 5

## 2015-05-23 NOTE — Progress Notes (Signed)
[facility] day:  05/23/2015  Chief Complaint: Alex Smith is a 80 y.o. male with recurrent hairy cell leukemia who is seen for assessment on day 82 status post cladribine.  HPI:  The patient was last seen in the medical oncology clinic on 04/25/2015.  At that time, he was day 60 s/p cladribine. Symptomatically, he felt good. Exam was stable. Hematocrit was 26.7, platelets 124,000, WBC 2700 with an ANC of 1700.  He denied any fevers or infections.  We discussed him going back to work.   During the interim, he voices no concerns.  He is back at work.  He denies any bruising or bleeding.  He denies any infections.  Past Medical History  Diagnosis Date  . Hypertension   . GERD (gastroesophageal reflux disease)   . HLD (hyperlipidemia)   . History of pneumonia 2000's    "once" (07/07/2012)  . Type II diabetes mellitus ([place])   . Hairy cell leukemia ([place]) 2006    recurrent, seizure on rituxan, now on cladribine ([place])  . Basal cell carcinoma of face   . Diverticulosis   . Colon polyps   . BPH (benign prostatic hypertrophy)     followed by urology, discharged (Dr. Bernardo Heater)  . B12 deficiency   . History of shingles   . Shortness of breath dyspnea   . Pneumonia   . CAP (community acquired pneumonia) 02/15/2015    Past Surgical History  Procedure Laterality Date  . Eye surgery Left 06/2012    laser surgery  . Cataract extraction w/ intraocular lens  implant, bilateral  ~ 2010  . Skin cancer excision      "all over my face" (07/07/2012)  . 25 gauge pars plana vitrectomy with 20 gauge mvr port for macular hole  07/07/2012    Procedure: 25 GAUGE PARS PLANA VITRECTOMY WITH 20 GAUGE MVR PORT FOR MACULAR HOLE;  Surgeon: Hayden Pedro, MD;  Location: [facility];  Service: Ophthalmology;  Laterality: Left;  . Serum patch  07/07/2012    Procedure: SERUM PATCH;  Surgeon: Hayden Pedro, MD;  Location: [facility];  Service: Ophthalmology;  Laterality:  Left;  . Gas insertion  07/07/2012    Procedure: INSERTION OF GAS;  Surgeon: Hayden Pedro, MD;  Location: [facility];  Service: Ophthalmology;  Laterality: Left;  [id number]  . Cardiovascular stress test  2013    treadmill - no evidence ischemia, EF 61%  . Colonoscopy  2014    [place] WNL no rpt needed, h/o polyps  . Peripheral vascular catheterization N/A 02/23/2015    Procedure: Glori Luis Cath Insertion;  Surgeon: Algernon Huxley, MD;  Location: [facility];  Service: Cardiovascular;  Laterality: N/A;    Family History  Problem Relation Age of Onset  . Dementia Mother   . Heart failure Father 78  . Cancer Sister     breast  . Diabetes Paternal Uncle   . Diabetes Paternal Aunt   . CAD Brother 47    MI  . Stroke Neg Hx     Social History:  reports that he quit smoking about 46 years ago. His smoking use included Cigarettes. He has a 50 pack-year smoking history. He has never used smokeless tobacco. He reports that he drinks alcohol. He reports that he does not use illicit drugs.  He works at [employer] 20 hours/week.  Allergies:  Allergies  Allergen Reactions  . Rituximab Rash    Chest tightness  .  Blood-Group Specific Substance Other (See Comments)    Had a post transfusion reaction of red blood cells; NOW REQUIRES WASHED BLOOD CELLS  . Primaxin [Imipenem] Other (See Comments)    Possible allergy  . Sulfa Antibiotics Itching and Rash  Possible allergy to Zoloft, allopurinol, and a chemotherapy medication.  He tolerates Dapsone.  Current Medications: Current Outpatient Prescriptions  Medication Sig Dispense Refill  . acyclovir (ZOVIRAX) 400 MG tablet Take 800 mg by mouth 2 (two) times daily.    Marland Kitchen aspirin EC 81 MG tablet Take 81 mg by mouth daily.    . dapsone 100 MG tablet Take by mouth.    Marland Kitchen glucose blood (ACCU-CHEK AVIVA PLUS) test strip Use to check sugar twice daily Dx:E11.9 200 each 3  . hydrochlorothiazide (HYDRODIURIL) 25 MG tablet Take 1 tablet (25 mg total) by mouth daily.     . insulin NPH Human (HUMULIN N) 100 UNIT/ML injection Inject 0.35 mLs (35 Units total) into the skin 2 (two) times daily before a meal. 20 mL 11  . insulin regular (HUMULIN R) 100 units/mL injection Inject 0.1 mLs (10 Units total) into the skin 2 (two) times daily before a meal. 10 mL 11  . lidocaine-prilocaine (EMLA) cream Apply 1 application topically as needed. 30 g 3  . lisinopril (PRINIVIL,ZESTRIL) 20 MG tablet TAKE ONE TABLET BY MOUTH TWICE DAILY 60 tablet 6  . metFORMIN (GLUCOPHAGE) 1000 MG tablet TAKE ONE TABLET BY MOUTH TWICE DAILY AS DIRECTED 180 tablet 3  . MICROLET LANCETS MISC USE AS DIRECTED 200 each 3  . omeprazole (PRILOSEC) 20 MG capsule Take 20 mg by mouth daily.     . ondansetron (ZOFRAN) 8 MG tablet Take 1 tablet (8 mg total) by mouth every 8 (eight) hours as needed for nausea or vomiting. 20 tablet 0  . pravastatin (PRAVACHOL) 20 MG tablet TAKE ONE TABLET AT BEDTIME 30 tablet 6  . tamsulosin (FLOMAX) 0.4 MG CAPS capsule Take 0.4 mg by mouth daily.    . vitamin B-12 (CYANOCOBALAMIN) 1000 MCG tablet Take 1,000 mcg by mouth daily.    . vitamin E 400 UNIT capsule Take 200 Units by mouth daily.      No current facility-administered medications for this visit.   Facility-Administered Medications Ordered in Other Visits  Medication Dose Route Frequency Provider Last Rate Last Dose  . heparin lock flush 100 unit/mL  500 Units Intravenous Once Lequita Asal, MD      . sodium chloride 0.9 % injection 10 mL  10 mL Intravenous PRN Lequita Asal, MD   10 mL at 03/03/15 0903  . sodium chloride 0.9 % injection 10 mL  10 mL Intracatheter PRN Lequita Asal, MD   10 mL at 03/10/15 1410   Review of Systems:  GENERAL:  Energy level fair.  No fevers or sweats.  Weight down 1 pound. PERFORMANCE STATUS (ECOG):  1 HEENT:  No visual changes, runny nose, sore throat. Pulmonary:  No shortness of breath.  No cough.  No hemoptysis. Cardiac:  No chest pain, palpitations,  orthopnea, or PND. GI:  Using Miralax for constipation.  No nausea, vomiting, diarrhea, melena or hematochezia. GU:  No urgency, frequency, dysuria, or hematuria. Musculoskeletal:  No back pain.  No joint pain.  No muscle tenderness. Extremities:  No pain or swelling. Skin:  No rashes or skin changes. Neuro:  No headache, numbness or weakness, balance or coordination issues. Endocrine:  Diabetes.  No thyroid issues, hot flashes or night sweats. Psych:  No mood changes, depression or anxiety. Pain:  No focal pain. Review of systems:  All other systems reviewed and found to be negative.   Physical Exam: Blood pressure 159/75, pulse 88, temperature 98.2 F (36.8 C), temperature source Oral, height 5' 7"  (1.702 m), weight 217 lb 13 oz (98.8 kg), SpO2 100 %.  GENERAL:  Well developed, well nourished, sitting comfortably in the exam room in no acute distress. MENTAL STATUS:  Alert and oriented to person, place and time. HEAD:  Pearline Cables hair.  Normocephalic, atraumatic, face symmetric, no Cushingoid features. EYES:  Glasses.  Dark blue eyes.  Pupils equal round and reactive to light and accomodation.  No conjunctivitis or scleral icterus. ENT:  Oropharynx clear without lesion.  Tongue normal. Mucous membranes moist.  RESPIRATORY:  Clear to auscultation without rales, wheezes or rhonchi. CARDIOVASCULAR:  Regular rate and rhythm without murmur, rub or gallop. ABDOMEN:  Soft, non-tender, with active bowel sounds, and no hepatosplenomegaly.  No masses. SKIN:  Abdominal bruises secondary to injections.  No rashes, ulcers or lesions. EXTREMITIES: No edema, no skin discoloration or tenderness.  No palpable cords. LYMPH NODES: No palpable cervical, supraclavicular, axillary or inguinal adenopathy  NEUROLOGICAL: Unremarkable. PSYCH:  Appropriate.   Infusion on 05/23/2015  Component Date Value Ref Range Status  . WBC 05/23/2015 2.8* 3.8 - 10.6 K/uL Final   A-LINE DRAW  . RBC 05/23/2015 2.29* 4.40 - 5.90  MIL/uL Final  . Hemoglobin 05/23/2015 8.7* 13.0 - 18.0 g/dL Final  . HCT 05/23/2015 25.6* 40.0 - 52.0 % Final  . MCV 05/23/2015 111.9* 80.0 - 100.0 fL Final  . MCH 05/23/2015 37.8* 26.0 - 34.0 pg Final  . MCHC 05/23/2015 33.8  32.0 - 36.0 g/dL Final  . RDW 05/23/2015 16.1* 11.5 - 14.5 % Final  . Platelets 05/23/2015 107* 150 - 440 K/uL Final  . Neutrophils Relative % 05/23/2015 58   Final  . Neutro Abs 05/23/2015 1.6  1.4 - 6.5 K/uL Final  . Lymphocytes Relative 05/23/2015 26   Final  . Lymphs Abs 05/23/2015 0.7* 1.0 - 3.6 K/uL Final  . Monocytes Relative 05/23/2015 8   Final  . Monocytes Absolute 05/23/2015 0.2  0.2 - 1.0 K/uL Final  . Eosinophils Relative 05/23/2015 7   Final  . Eosinophils Absolute 05/23/2015 0.2  0 - 0.7 K/uL Final  . Basophils Relative 05/23/2015 1   Final  . Basophils Absolute 05/23/2015 0.0  0 - 0.1 K/uL Final    Assessment:  Alex Smith is a 80 y.o. male with recurrent hairy cell leukemia.  He was diagnosed with hairy cell leukemia in 2006.  He received cladribine 11/06-11/05/2005.  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  Bone marrow aspirate and biopsy on 02/07/2015 revealed a extensive marrow involvement by recurrent hairy cell leukemia (approximately 80% of cells in the core). There was a monoclonal plasma cell infiltrate (approximately 10%), compatible with a plasma cell neoplasm. Marrow was hypercellular (40-50%) with markedly diminished residual trilineage hematopoiesis and mild multilineage dyspoiesis.  Peripheral smear revealed leukopenia (WBC 1900) with 2% atypical lymphoid cells compatible with hairy cell leukemia. There was moderate anemia with anisopoikilocytosis. 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  FISH studies for MDS were negative.  Cytogenetics were normal (46, XY).  He has an unclear allergy history.  He may be allergic to Primaxin,  Zoloft, allopurinol, and a chemotherapy medication.  Reaction occurred in  06/2005.  He is allergic to sulfa.  He is not allergic to dapsone.  He requires washed RBCs.  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  He completed a course of Levaquin for pneumonia.  He is on prophylactic acyclovir, dapsone, and voriconazole.  Voriconazole is costly.  He is currently day 82 status post cladribine (began 03/03/2015).    Symptomatically, his energy level is good.  Exam is stable.  White count has fluctuated  3700 to 2700 (ANC 1600 to 1700).  Hematocrit is 25.6 and platelets are 107,000.  Creatinine is 1.35 (CrCl 49 ml/min).   Plan: 1.  Labs today:  CBC with diff, CMP, type and hold. 2.  Discuss fluctuating counts.  Discuss additional work-up. 3.  RTC in 2 weeks for labs (CBC with diff, ferritin, iron studies, B12, folate, TSH, retic, SPEP, FLC assay). 4.  RTC in 4 weeks for MD assess and labs (CBC with diff, CMP).   Lequita Asal, MD  05/23/2015

## 2015-05-23 NOTE — Progress Notes (Signed)
Patient here for follow up no complaints today. 

## 2015-06-05 ENCOUNTER — Other Ambulatory Visit: Payer: Self-pay | Admitting: Family Medicine

## 2015-06-06 ENCOUNTER — Inpatient Hospital Stay: Payer: Medicare Other

## 2015-06-06 DIAGNOSIS — N400 Enlarged prostate without lower urinary tract symptoms: Secondary | ICD-10-CM | POA: Diagnosis not present

## 2015-06-06 DIAGNOSIS — Z85828 Personal history of other malignant neoplasm of skin: Secondary | ICD-10-CM | POA: Diagnosis not present

## 2015-06-06 DIAGNOSIS — Z794 Long term (current) use of insulin: Secondary | ICD-10-CM | POA: Diagnosis not present

## 2015-06-06 DIAGNOSIS — D61818 Other pancytopenia: Secondary | ICD-10-CM | POA: Diagnosis not present

## 2015-06-06 DIAGNOSIS — I10 Essential (primary) hypertension: Secondary | ICD-10-CM | POA: Diagnosis not present

## 2015-06-06 DIAGNOSIS — C9140 Hairy cell leukemia not having achieved remission: Secondary | ICD-10-CM

## 2015-06-06 DIAGNOSIS — K219 Gastro-esophageal reflux disease without esophagitis: Secondary | ICD-10-CM | POA: Diagnosis not present

## 2015-06-06 DIAGNOSIS — E785 Hyperlipidemia, unspecified: Secondary | ICD-10-CM | POA: Diagnosis not present

## 2015-06-06 DIAGNOSIS — Z87891 Personal history of nicotine dependence: Secondary | ICD-10-CM | POA: Diagnosis not present

## 2015-06-06 DIAGNOSIS — Z8719 Personal history of other diseases of the digestive system: Secondary | ICD-10-CM | POA: Diagnosis not present

## 2015-06-06 DIAGNOSIS — Z8701 Personal history of pneumonia (recurrent): Secondary | ICD-10-CM | POA: Diagnosis not present

## 2015-06-06 DIAGNOSIS — Z79899 Other long term (current) drug therapy: Secondary | ICD-10-CM | POA: Diagnosis not present

## 2015-06-06 DIAGNOSIS — E119 Type 2 diabetes mellitus without complications: Secondary | ICD-10-CM | POA: Diagnosis not present

## 2015-06-06 DIAGNOSIS — Z7982 Long term (current) use of aspirin: Secondary | ICD-10-CM | POA: Diagnosis not present

## 2015-06-06 DIAGNOSIS — E538 Deficiency of other specified B group vitamins: Secondary | ICD-10-CM | POA: Diagnosis not present

## 2015-06-06 LAB — CBC WITH DIFFERENTIAL/PLATELET
Basophils Absolute: 0 10*3/uL (ref 0–0.1)
Basophils Relative: 1 %
Eosinophils Absolute: 0.2 10*3/uL (ref 0–0.7)
Eosinophils Relative: 7 %
HCT: 25.8 % — ABNORMAL LOW (ref 40.0–52.0)
Hemoglobin: 8.6 g/dL — ABNORMAL LOW (ref 13.0–18.0)
Lymphocytes Relative: 22 %
Lymphs Abs: 0.5 10*3/uL — ABNORMAL LOW (ref 1.0–3.6)
MCH: 37.5 pg — ABNORMAL HIGH (ref 26.0–34.0)
MCHC: 33.4 g/dL (ref 32.0–36.0)
MCV: 112.2 fL — ABNORMAL HIGH (ref 80.0–100.0)
Monocytes Absolute: 0.2 10*3/uL (ref 0.2–1.0)
Monocytes Relative: 10 %
Neutro Abs: 1.4 10*3/uL (ref 1.4–6.5)
Neutrophils Relative %: 60 %
Platelets: 111 10*3/uL — ABNORMAL LOW (ref 150–440)
RBC: 2.3 MIL/uL — ABNORMAL LOW (ref 4.40–5.90)
RDW: 15.7 % — ABNORMAL HIGH (ref 11.5–14.5)
WBC: 2.4 10*3/uL — ABNORMAL LOW (ref 3.8–10.6)

## 2015-06-08 LAB — SAMPLE TO BLOOD BANK

## 2015-06-20 ENCOUNTER — Ambulatory Visit: Payer: Self-pay | Admitting: Hematology and Oncology

## 2015-06-20 ENCOUNTER — Other Ambulatory Visit: Payer: Self-pay

## 2015-06-22 ENCOUNTER — Other Ambulatory Visit: Payer: Self-pay | Admitting: Family Medicine

## 2015-06-26 ENCOUNTER — Other Ambulatory Visit: Payer: Self-pay

## 2015-06-26 ENCOUNTER — Ambulatory Visit: Payer: Self-pay

## 2015-06-26 ENCOUNTER — Inpatient Hospital Stay (HOSPITAL_BASED_OUTPATIENT_CLINIC_OR_DEPARTMENT_OTHER): Payer: Medicare Other

## 2015-06-26 ENCOUNTER — Inpatient Hospital Stay: Payer: Medicare Other | Attending: Hematology and Oncology | Admitting: Hematology and Oncology

## 2015-06-26 ENCOUNTER — Inpatient Hospital Stay: Payer: Medicare Other

## 2015-06-26 DIAGNOSIS — I10 Essential (primary) hypertension: Secondary | ICD-10-CM | POA: Insufficient documentation

## 2015-06-26 DIAGNOSIS — Z79899 Other long term (current) drug therapy: Secondary | ICD-10-CM | POA: Insufficient documentation

## 2015-06-26 DIAGNOSIS — C9142 Hairy cell leukemia, in relapse: Secondary | ICD-10-CM

## 2015-06-26 DIAGNOSIS — K219 Gastro-esophageal reflux disease without esophagitis: Secondary | ICD-10-CM | POA: Diagnosis not present

## 2015-06-26 DIAGNOSIS — Z87891 Personal history of nicotine dependence: Secondary | ICD-10-CM

## 2015-06-26 DIAGNOSIS — Z7984 Long term (current) use of oral hypoglycemic drugs: Secondary | ICD-10-CM | POA: Insufficient documentation

## 2015-06-26 DIAGNOSIS — Z7982 Long term (current) use of aspirin: Secondary | ICD-10-CM

## 2015-06-26 DIAGNOSIS — Z8701 Personal history of pneumonia (recurrent): Secondary | ICD-10-CM | POA: Diagnosis not present

## 2015-06-26 DIAGNOSIS — Z85828 Personal history of other malignant neoplasm of skin: Secondary | ICD-10-CM | POA: Insufficient documentation

## 2015-06-26 DIAGNOSIS — B029 Zoster without complications: Secondary | ICD-10-CM

## 2015-06-26 DIAGNOSIS — E119 Type 2 diabetes mellitus without complications: Secondary | ICD-10-CM | POA: Insufficient documentation

## 2015-06-26 DIAGNOSIS — R0602 Shortness of breath: Secondary | ICD-10-CM | POA: Insufficient documentation

## 2015-06-26 DIAGNOSIS — N400 Enlarged prostate without lower urinary tract symptoms: Secondary | ICD-10-CM

## 2015-06-26 DIAGNOSIS — E785 Hyperlipidemia, unspecified: Secondary | ICD-10-CM | POA: Diagnosis not present

## 2015-06-26 DIAGNOSIS — Z803 Family history of malignant neoplasm of breast: Secondary | ICD-10-CM | POA: Insufficient documentation

## 2015-06-26 DIAGNOSIS — Z8719 Personal history of other diseases of the digestive system: Secondary | ICD-10-CM | POA: Diagnosis not present

## 2015-06-26 DIAGNOSIS — C9140 Hairy cell leukemia not having achieved remission: Secondary | ICD-10-CM

## 2015-06-26 DIAGNOSIS — Z452 Encounter for adjustment and management of vascular access device: Secondary | ICD-10-CM | POA: Insufficient documentation

## 2015-06-26 DIAGNOSIS — E539 Vitamin B deficiency, unspecified: Secondary | ICD-10-CM

## 2015-06-26 LAB — CBC WITH DIFFERENTIAL/PLATELET
Basophils Absolute: 0 10*3/uL (ref 0–0.1)
Basophils Relative: 0 %
Eosinophils Absolute: 0.1 10*3/uL (ref 0–0.7)
Eosinophils Relative: 4 %
HCT: 26.3 % — ABNORMAL LOW (ref 40.0–52.0)
Hemoglobin: 8.9 g/dL — ABNORMAL LOW (ref 13.0–18.0)
Lymphocytes Relative: 24 %
Lymphs Abs: 0.6 10*3/uL — ABNORMAL LOW (ref 1.0–3.6)
MCH: 37.2 pg — ABNORMAL HIGH (ref 26.0–34.0)
MCHC: 33.9 g/dL (ref 32.0–36.0)
MCV: 109.4 fL — ABNORMAL HIGH (ref 80.0–100.0)
Monocytes Absolute: 0.2 10*3/uL (ref 0.2–1.0)
Monocytes Relative: 9 %
Neutro Abs: 1.5 10*3/uL (ref 1.4–6.5)
Neutrophils Relative %: 63 %
Platelets: 103 10*3/uL — ABNORMAL LOW (ref 150–440)
RBC: 2.4 MIL/uL — ABNORMAL LOW (ref 4.40–5.90)
RDW: 14.8 % — ABNORMAL HIGH (ref 11.5–14.5)
WBC: 2.4 10*3/uL — ABNORMAL LOW (ref 3.8–10.6)

## 2015-06-26 LAB — COMPREHENSIVE METABOLIC PANEL
ALT: 18 U/L (ref 17–63)
AST: 28 U/L (ref 15–41)
Albumin: 3.9 g/dL (ref 3.5–5.0)
Alkaline Phosphatase: 71 U/L (ref 38–126)
Anion gap: 8 (ref 5–15)
BUN: 24 mg/dL — ABNORMAL HIGH (ref 6–20)
CO2: 25 mmol/L (ref 22–32)
Calcium: 8.9 mg/dL (ref 8.9–10.3)
Chloride: 107 mmol/L (ref 101–111)
Creatinine, Ser: 1.21 mg/dL (ref 0.61–1.24)
GFR calc Af Amer: >60 mL/min (ref >60)
GFR calc non Af Amer: 56 mL/min — ABNORMAL LOW (ref >60)
Glucose, Bld: 215 mg/dL — ABNORMAL HIGH (ref 65–99)
Potassium: 4 mmol/L (ref 3.5–5.1)
Sodium: 140 mmol/L (ref 135–145)
Total Bilirubin: 0.2 mg/dL — ABNORMAL LOW (ref 0.3–1.2)
Total Protein: 7.5 g/dL (ref 6.5–8.1)

## 2015-06-26 MED ORDER — HEPARIN SOD (PORK) LOCK FLUSH 100 UNIT/ML IV SOLN
500.0000 [IU] | Freq: Once | INTRAVENOUS | Status: AC
  Administered 2015-06-26: 500 [IU] via INTRAVENOUS
  Filled 2015-06-26: qty 5

## 2015-06-26 MED ORDER — SODIUM CHLORIDE 0.9 % IJ SOLN
10.0000 mL | INTRAMUSCULAR | Status: DC | PRN
  Administered 2015-06-26: 10 mL via INTRAVENOUS
  Filled 2015-06-26: qty 10

## 2015-06-26 NOTE — Progress Notes (Signed)
[facility] day:  06/26/2015  Chief Complaint: Alex Smith is a 80 y.o. male with recurrent hairy cell leukemia who is seen for assessment on day 116 s/p cladribine.  HPI:  The patient was last seen in the medical oncology clinic on 05/23/2015.  At that time, he was day 32 s/p cladribine. Symptomatically, he felt good. Exam was stable. Hematocrit was 25.6, platelets 107,000, WBC 2800 with an ANC of 1600.  He denied any fevers or infections.   During the interim, he notes that his energy level is "average".  He has had no fevers or infections.  He notes some shortness of breath with bending over.  He denies any pleuritic chest pain or lower extremity edema.  He is working 20 hours a week.  Past Medical History  Diagnosis Date  . Hypertension   . GERD (gastroesophageal reflux disease)   . HLD (hyperlipidemia)   . History of pneumonia 2000's    "once" (07/07/2012)  . Type II diabetes mellitus ([place])   . Hairy cell leukemia ([place]) 2006    recurrent, seizure on rituxan, now on cladribine ([place])  . Basal cell carcinoma of face   . Diverticulosis   . Colon polyps   . BPH (benign prostatic hypertrophy)     followed by urology, discharged (Dr. Bernardo Heater)  . B12 deficiency   . History of shingles   . Shortness of breath dyspnea   . Pneumonia   . CAP (community acquired pneumonia) 02/15/2015    Past Surgical History  Procedure Laterality Date  . Eye surgery Left 06/2012    laser surgery  . Cataract extraction w/ intraocular lens  implant, bilateral  ~ 2010  . Skin cancer excision      "all over my face" (07/07/2012)  . 25 gauge pars plana vitrectomy with 20 gauge mvr port for macular hole  07/07/2012    Procedure: 25 GAUGE PARS PLANA VITRECTOMY WITH 20 GAUGE MVR PORT FOR MACULAR HOLE;  Surgeon: Hayden Pedro, MD;  Location: [facility];  Service: Ophthalmology;  Laterality: Left;  . Serum patch  07/07/2012    Procedure: SERUM PATCH;  Surgeon:  Hayden Pedro, MD;  Location: [facility];  Service: Ophthalmology;  Laterality: Left;  . Gas insertion  07/07/2012    Procedure: INSERTION OF GAS;  Surgeon: Hayden Pedro, MD;  Location: [facility];  Service: Ophthalmology;  Laterality: Left;  [id number]  . Cardiovascular stress test  2013    treadmill - no evidence ischemia, EF 61%  . Colonoscopy  2014    [place] WNL no rpt needed, h/o polyps  . Peripheral vascular catheterization N/A 02/23/2015    Procedure: Glori Luis Cath Insertion;  Surgeon: Algernon Huxley, MD;  Location: [facility];  Service: Cardiovascular;  Laterality: N/A;    Family History  Problem Relation Age of Onset  . Dementia Mother   . Heart failure Father 70  . Cancer Sister     breast  . Diabetes Paternal Uncle   . Diabetes Paternal Aunt   . CAD Brother 35    MI  . Stroke Neg Hx     Social History:  reports that he quit smoking about 46 years ago. His smoking use included Cigarettes. He has a 50 pack-year smoking history. He has never used smokeless tobacco. He reports that he drinks alcohol. He reports that he does not use illicit drugs.  He works at [employer] 20 hours/week.  The  patient 's wife has had 3 hip operations.  Allergies:  Allergies  Allergen Reactions  . Rituximab Rash    Chest tightness  . Blood-Group Specific Substance Other (See Comments)    Had a post transfusion reaction of red blood cells; NOW REQUIRES WASHED BLOOD CELLS  . Primaxin [Imipenem] Other (See Comments)    Possible allergy  . Sulfa Antibiotics Itching and Rash  Possible allergy to Zoloft, allopurinol, and a chemotherapy medication.  He tolerates Dapsone.  Current Medications: Current Outpatient Prescriptions  Medication Sig Dispense Refill  . acyclovir (ZOVIRAX) 400 MG tablet Take 800 mg by mouth 2 (two) times daily.    Marland Kitchen aspirin EC 81 MG tablet Take 81 mg by mouth daily.    Marland Kitchen glucose blood (ACCU-CHEK AVIVA PLUS) test strip Use to check sugar twice daily Dx:E11.9 200 each 3  .  hydrochlorothiazide (HYDRODIURIL) 25 MG tablet Take 1 tablet (25 mg total) by mouth daily.    . insulin NPH Human (HUMULIN N) 100 UNIT/ML injection Inject 0.35 mLs (35 Units total) into the skin 2 (two) times daily before a meal. 20 mL 11  . lidocaine-prilocaine (EMLA) cream Apply 1 application topically as needed. 30 g 3  . lisinopril (PRINIVIL,ZESTRIL) 20 MG tablet TAKE ONE TABLET BY MOUTH TWICE DAILY 60 tablet 6  . metFORMIN (GLUCOPHAGE) 1000 MG tablet TAKE ONE TABLET BY MOUTH TWICE DAILY AS DIRECTED 180 tablet 3  . MICROLET LANCETS MISC USE AS DIRECTED 200 each 3  . omeprazole (PRILOSEC) 20 MG capsule Take 20 mg by mouth daily.     . ondansetron (ZOFRAN) 8 MG tablet Take 1 tablet (8 mg total) by mouth every 8 (eight) hours as needed for nausea or vomiting. 20 tablet 0  . pravastatin (PRAVACHOL) 20 MG tablet TAKE ONE TABLET BY MOUTH AT BEDTIME 30 tablet 11  . tamsulosin (FLOMAX) 0.4 MG CAPS capsule Take 0.4 mg by mouth daily.    . vitamin B-12 (CYANOCOBALAMIN) 1000 MCG tablet Take 1,000 mcg by mouth daily.    . vitamin E 400 UNIT capsule Take 200 Units by mouth daily.     . dapsone 100 MG tablet Take 1 tablet (100 mg total) by mouth daily. 30 tablet 0  . HUMULIN R 100 UNIT/ML injection INJCET 10 UNITS SUBCUTANEOUSLY TWICE DAILY BEFORE MEALS 10 mL 11  . hydrochlorothiazide (HYDRODIURIL) 25 MG tablet TAKE ONE TABLET EVERY DAY 30 tablet 11  . insulin NPH Human (HUMULIN N) 100 UNIT/ML injection Inject 0.35 mLs (35 Units total) into the skin 2 (two) times daily before a meal. (Patient not taking: Reported on 06/26/2015) 20 mL 11   No current facility-administered medications for this visit.   Facility-Administered Medications Ordered in Other Visits  Medication Dose Route Frequency Provider Last Rate Last Dose  . heparin lock flush 100 unit/mL  500 Units Intravenous Once Lequita Asal, MD      . sodium chloride 0.9 % injection 10 mL  10 mL Intravenous PRN Lequita Asal, MD   10 mL at  03/03/15 0903  . sodium chloride 0.9 % injection 10 mL  10 mL Intracatheter PRN Lequita Asal, MD   10 mL at 03/10/15 1410   Review of Systems:  GENERAL:  Energy level "average".  No fevers or sweats.  Weight stable. PERFORMANCE STATUS (ECOG):  1 HEENT:  No visual changes, runny nose, sore throat. Pulmonary:  Mild shortness of breath with bending over.  No cough.  No hemoptysis. Cardiac:  No chest pain,  palpitations, orthopnea, or PND. GI:  Constipation (uses Miralax).  No nausea, vomiting, diarrhea, melena or hematochezia. GU:  No urgency, frequency, dysuria, or hematuria. Musculoskeletal:  No back pain.  No joint pain.  No muscle tenderness. Extremities:  No pain or swelling. Skin:  No rashes or skin changes. Neuro:  No headache, numbness or weakness, balance or coordination issues. Endocrine:  Diabetes.  No thyroid issues, hot flashes or night sweats. Psych:  No mood changes, depression or anxiety. Pain:  No focal pain. Review of systems:  All other systems reviewed and found to be negative.   Physical Exam: There were no vitals taken for this visit.  GENERAL:  Well developed, well nourished, sitting comfortably in the exam room in no acute distress. MENTAL STATUS:  Alert and oriented to person, place and time. HEAD:  Gray/white hair.  Normocephalic, atraumatic, face symmetric, no Cushingoid features. EYES:  Glasses. Blue eyes.  Pupils equal round and reactive to light and accomodation.  No conjunctivitis or scleral icterus. ENT:  Oropharynx clear without lesion.  Tongue normal. Mucous membranes moist.  RESPIRATORY:  Clear to auscultation without rales, wheezes or rhonchi. CARDIOVASCULAR:  Regular rate and rhythm without murmur, rub or gallop. ABDOMEN:  Soft, non-tender, with active bowel sounds, and no hepatosplenomegaly.  No masses. SKIN:  Abdominal bruises secondary to injections.  No rashes, ulcers or lesions. EXTREMITIES: No edema, no skin discoloration or tenderness.  No  palpable cords. LYMPH NODES: No palpable cervical, supraclavicular, axillary or inguinal adenopathy  NEUROLOGICAL: Unremarkable. PSYCH:  Appropriate.   Infusion on 06/26/2015  Component Date Value Ref Range Status  . Sodium 06/26/2015 140  135 - 145 mmol/L Final  . Potassium 06/26/2015 4.0  3.5 - 5.1 mmol/L Final  . Chloride 06/26/2015 107  101 - 111 mmol/L Final  . CO2 06/26/2015 25  22 - 32 mmol/L Final  . Glucose, Bld 06/26/2015 215* 65 - 99 mg/dL Final  . BUN 06/26/2015 24* 6 - 20 mg/dL Final  . Creatinine, Ser 06/26/2015 1.21  0.61 - 1.24 mg/dL Final  . Calcium 06/26/2015 8.9  8.9 - 10.3 mg/dL Final  . Total Protein 06/26/2015 7.5  6.5 - 8.1 g/dL Final  . Albumin 06/26/2015 3.9  3.5 - 5.0 g/dL Final  . AST 06/26/2015 28  15 - 41 U/L Final  . ALT 06/26/2015 18  17 - 63 U/L Final  . Alkaline Phosphatase 06/26/2015 71  38 - 126 U/L Final  . Total Bilirubin 06/26/2015 0.2* 0.3 - 1.2 mg/dL Final  . GFR calc non Af Amer 06/26/2015 56* >60 mL/min Final  . GFR calc Af Amer 06/26/2015 >60  >60 mL/min Final   Comment: (NOTE) The eGFR has been calculated using the CKD EPI equation. This calculation has not been validated in all clinical situations. eGFR's persistently <60 mL/min signify possible Chronic Kidney Disease.   . Anion gap 06/26/2015 8  5 - 15 Final  . WBC 06/26/2015 2.4* 3.8 - 10.6 K/uL Final  . RBC 06/26/2015 2.40* 4.40 - 5.90 MIL/uL Final  . Hemoglobin 06/26/2015 8.9* 13.0 - 18.0 g/dL Final  . HCT 06/26/2015 26.3* 40.0 - 52.0 % Final  . MCV 06/26/2015 109.4* 80.0 - 100.0 fL Final  . MCH 06/26/2015 37.2* 26.0 - 34.0 pg Final  . MCHC 06/26/2015 33.9  32.0 - 36.0 g/dL Final  . RDW 06/26/2015 14.8* 11.5 - 14.5 % Final  . Platelets 06/26/2015 103* 150 - 440 K/uL Final  . Neutrophils Relative % 06/26/2015 63  Final  . Neutro Abs 06/26/2015 1.5  1.4 - 6.5 K/uL Final  . Lymphocytes Relative 06/26/2015 24   Final  . Lymphs Abs 06/26/2015 0.6* 1.0 - 3.6 K/uL Final  .  Monocytes Relative 06/26/2015 9   Final  . Monocytes Absolute 06/26/2015 0.2  0.2 - 1.0 K/uL Final  . Eosinophils Relative 06/26/2015 4   Final  . Eosinophils Absolute 06/26/2015 0.1  0 - 0.7 K/uL Final  . Basophils Relative 06/26/2015 0   Final  . Basophils Absolute 06/26/2015 0.0  0 - 0.1 K/uL Final  . Blood Bank Specimen 06/26/2015 SAMPLE AVAILABLE FOR TESTING   Final  . Sample Expiration 06/26/2015 06/29/2015   Final    Assessment:  Alex Smith is a 80 y.o. male with recurrent hairy cell leukemia.  He was diagnosed with hairy cell leukemia in 2006.  He received cladribine 11/06-11/05/2005.  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  Bone marrow aspirate and biopsy on 02/07/2015 revealed a extensive marrow involvement by recurrent hairy cell leukemia (approximately 80% of cells in the core). There was a monoclonal plasma cell infiltrate (approximately 10%), compatible with a plasma cell neoplasm. Marrow was hypercellular (40-50%) with markedly diminished residual trilineage hematopoiesis and mild multilineage dyspoiesis.  Peripheral smear revealed leukopenia (WBC 1900) with 2% atypical lymphoid cells compatible with hairy cell leukemia. There was moderate anemia with anisopoikilocytosis. 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  FISH studies for MDS were negative.  Cytogenetics were normal (46, XY).  He has an unclear allergy history.  He may be allergic to Primaxin, Zoloft, allopurinol, and a chemotherapy medication.  Reaction occurred in 06/2005.  He is allergic to sulfa.  He is not allergic to dapsone.  He requires washed RBCs.  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  He completed a course of Levaquin for pneumonia.  He is on prophylactic acyclovir, dapsone, and voriconazole.  Voriconazole is costly.  He is currently day 116 status post cladribine (began 03/03/2015).     Symptomatically, his energy level is modest.  Exam is stable.  Counts have not recovered.  White count has fluctuated  3700 to 2400 (ANC 150 to 1700).  Hematocrit is 26.3 and platelets are 103,000.  Creatinine is 1.21 (CrCl 56 ml/min).   Plan: 1.  Labs today:  CBC with diff, CMP, type and hold. 2.  Discontinue voriconazole. 3.  Re-review neutropenic precautions. 4.  RTC in 2 weeks for CBC with diff. 5.  Follow-up prior work-up (ferritin, iron studies, B12, folate, TSH, SPEP, free light chains). 6.  RTC in 4 weeks for MD assessment and labs (CBC with diff, CMP, retic).   Lequita Asal, MD  06/26/2015, 2:19 PM

## 2015-06-27 ENCOUNTER — Other Ambulatory Visit: Payer: Self-pay | Admitting: Family Medicine

## 2015-06-27 LAB — SAMPLE TO BLOOD BANK

## 2015-07-03 ENCOUNTER — Other Ambulatory Visit: Payer: Self-pay | Admitting: Family Medicine

## 2015-07-03 NOTE — Telephone Encounter (Signed)
OK to refill

## 2015-07-10 ENCOUNTER — Inpatient Hospital Stay: Payer: Medicare Other

## 2015-07-10 DIAGNOSIS — B029 Zoster without complications: Secondary | ICD-10-CM | POA: Diagnosis not present

## 2015-07-10 DIAGNOSIS — E785 Hyperlipidemia, unspecified: Secondary | ICD-10-CM | POA: Diagnosis not present

## 2015-07-10 DIAGNOSIS — Z803 Family history of malignant neoplasm of breast: Secondary | ICD-10-CM | POA: Diagnosis not present

## 2015-07-10 DIAGNOSIS — K219 Gastro-esophageal reflux disease without esophagitis: Secondary | ICD-10-CM | POA: Diagnosis not present

## 2015-07-10 DIAGNOSIS — Z452 Encounter for adjustment and management of vascular access device: Secondary | ICD-10-CM | POA: Diagnosis not present

## 2015-07-10 DIAGNOSIS — Z79899 Other long term (current) drug therapy: Secondary | ICD-10-CM | POA: Diagnosis not present

## 2015-07-10 DIAGNOSIS — Z8701 Personal history of pneumonia (recurrent): Secondary | ICD-10-CM | POA: Diagnosis not present

## 2015-07-10 DIAGNOSIS — I10 Essential (primary) hypertension: Secondary | ICD-10-CM | POA: Diagnosis not present

## 2015-07-10 DIAGNOSIS — E119 Type 2 diabetes mellitus without complications: Secondary | ICD-10-CM | POA: Diagnosis not present

## 2015-07-10 DIAGNOSIS — Z85828 Personal history of other malignant neoplasm of skin: Secondary | ICD-10-CM | POA: Diagnosis not present

## 2015-07-10 DIAGNOSIS — Z7984 Long term (current) use of oral hypoglycemic drugs: Secondary | ICD-10-CM | POA: Diagnosis not present

## 2015-07-10 DIAGNOSIS — E539 Vitamin B deficiency, unspecified: Secondary | ICD-10-CM | POA: Diagnosis not present

## 2015-07-10 DIAGNOSIS — Z7982 Long term (current) use of aspirin: Secondary | ICD-10-CM | POA: Diagnosis not present

## 2015-07-10 DIAGNOSIS — Z8719 Personal history of other diseases of the digestive system: Secondary | ICD-10-CM | POA: Diagnosis not present

## 2015-07-10 DIAGNOSIS — N400 Enlarged prostate without lower urinary tract symptoms: Secondary | ICD-10-CM | POA: Diagnosis not present

## 2015-07-10 DIAGNOSIS — R0602 Shortness of breath: Secondary | ICD-10-CM | POA: Diagnosis not present

## 2015-07-10 DIAGNOSIS — Z87891 Personal history of nicotine dependence: Secondary | ICD-10-CM | POA: Diagnosis not present

## 2015-07-10 DIAGNOSIS — C9142 Hairy cell leukemia, in relapse: Secondary | ICD-10-CM | POA: Diagnosis not present

## 2015-07-10 DIAGNOSIS — C9140 Hairy cell leukemia not having achieved remission: Secondary | ICD-10-CM

## 2015-07-10 LAB — CBC WITH DIFFERENTIAL/PLATELET
Basophils Absolute: 0 10*3/uL (ref 0–0.1)
Basophils Relative: 1 %
Eosinophils Absolute: 0.1 10*3/uL (ref 0–0.7)
Eosinophils Relative: 3 %
HCT: 27.6 % — ABNORMAL LOW (ref 40.0–52.0)
Hemoglobin: 9.3 g/dL — ABNORMAL LOW (ref 13.0–18.0)
Lymphocytes Relative: 25 %
Lymphs Abs: 0.6 10*3/uL — ABNORMAL LOW (ref 1.0–3.6)
MCH: 36.9 pg — ABNORMAL HIGH (ref 26.0–34.0)
MCHC: 33.7 g/dL (ref 32.0–36.0)
MCV: 109.3 fL — ABNORMAL HIGH (ref 80.0–100.0)
Monocytes Absolute: 0.3 10*3/uL (ref 0.2–1.0)
Monocytes Relative: 10 %
Neutro Abs: 1.6 10*3/uL (ref 1.4–6.5)
Neutrophils Relative %: 61 %
Platelets: 116 10*3/uL — ABNORMAL LOW (ref 150–440)
RBC: 2.52 MIL/uL — ABNORMAL LOW (ref 4.40–5.90)
RDW: 15.2 % — ABNORMAL HIGH (ref 11.5–14.5)
WBC: 2.6 10*3/uL — ABNORMAL LOW (ref 3.8–10.6)

## 2015-07-10 LAB — SAMPLE TO BLOOD BANK

## 2015-07-11 ENCOUNTER — Other Ambulatory Visit: Payer: Self-pay

## 2015-07-11 MED ORDER — DAPSONE 100 MG PO TABS: 100.0000 mg | ORAL_TABLET | Freq: Every day | ORAL | Status: DC

## 2015-07-11 NOTE — Telephone Encounter (Deleted)
I received a refill request for Dapsone 100mg  tablets. Last refilled 06/08/15. Ok to refill this medication?

## 2015-07-25 ENCOUNTER — Encounter: Payer: Self-pay | Admitting: Hematology and Oncology

## 2015-07-25 ENCOUNTER — Inpatient Hospital Stay: Payer: Medicare Other | Attending: Hematology and Oncology

## 2015-07-25 ENCOUNTER — Inpatient Hospital Stay (HOSPITAL_BASED_OUTPATIENT_CLINIC_OR_DEPARTMENT_OTHER): Payer: Medicare Other | Admitting: Hematology and Oncology

## 2015-07-25 VITALS — BP 176/78 | HR 101 | Temp 98.0°F | Resp 18 | Ht 67.0 in | Wt 214.3 lb

## 2015-07-25 DIAGNOSIS — Z87891 Personal history of nicotine dependence: Secondary | ICD-10-CM | POA: Insufficient documentation

## 2015-07-25 DIAGNOSIS — Z882 Allergy status to sulfonamides status: Secondary | ICD-10-CM | POA: Insufficient documentation

## 2015-07-25 DIAGNOSIS — K219 Gastro-esophageal reflux disease without esophagitis: Secondary | ICD-10-CM | POA: Insufficient documentation

## 2015-07-25 DIAGNOSIS — E119 Type 2 diabetes mellitus without complications: Secondary | ICD-10-CM | POA: Insufficient documentation

## 2015-07-25 DIAGNOSIS — Z8701 Personal history of pneumonia (recurrent): Secondary | ICD-10-CM

## 2015-07-25 DIAGNOSIS — Z7984 Long term (current) use of oral hypoglycemic drugs: Secondary | ICD-10-CM | POA: Diagnosis not present

## 2015-07-25 DIAGNOSIS — R5383 Other fatigue: Secondary | ICD-10-CM | POA: Diagnosis not present

## 2015-07-25 DIAGNOSIS — D61818 Other pancytopenia: Secondary | ICD-10-CM

## 2015-07-25 DIAGNOSIS — Z7982 Long term (current) use of aspirin: Secondary | ICD-10-CM

## 2015-07-25 DIAGNOSIS — I10 Essential (primary) hypertension: Secondary | ICD-10-CM | POA: Insufficient documentation

## 2015-07-25 DIAGNOSIS — E538 Deficiency of other specified B group vitamins: Secondary | ICD-10-CM | POA: Diagnosis not present

## 2015-07-25 DIAGNOSIS — N400 Enlarged prostate without lower urinary tract symptoms: Secondary | ICD-10-CM | POA: Insufficient documentation

## 2015-07-25 DIAGNOSIS — C9142 Hairy cell leukemia, in relapse: Secondary | ICD-10-CM | POA: Diagnosis not present

## 2015-07-25 DIAGNOSIS — Z86010 Personal history of colonic polyps: Secondary | ICD-10-CM | POA: Insufficient documentation

## 2015-07-25 DIAGNOSIS — Z85828 Personal history of other malignant neoplasm of skin: Secondary | ICD-10-CM

## 2015-07-25 DIAGNOSIS — C9140 Hairy cell leukemia not having achieved remission: Secondary | ICD-10-CM | POA: Diagnosis not present

## 2015-07-25 DIAGNOSIS — E785 Hyperlipidemia, unspecified: Secondary | ICD-10-CM

## 2015-07-25 DIAGNOSIS — Z452 Encounter for adjustment and management of vascular access device: Secondary | ICD-10-CM | POA: Insufficient documentation

## 2015-07-25 DIAGNOSIS — Z79899 Other long term (current) drug therapy: Secondary | ICD-10-CM | POA: Insufficient documentation

## 2015-07-25 DIAGNOSIS — D539 Nutritional anemia, unspecified: Secondary | ICD-10-CM

## 2015-07-25 DIAGNOSIS — D472 Monoclonal gammopathy: Secondary | ICD-10-CM

## 2015-07-25 LAB — CBC WITH DIFFERENTIAL/PLATELET
Basophils Absolute: 0 10*3/uL (ref 0–0.1)
Basophils Relative: 1 %
Eosinophils Absolute: 0.1 10*3/uL (ref 0–0.7)
Eosinophils Relative: 2 %
HCT: 28.1 % — ABNORMAL LOW (ref 40.0–52.0)
Hemoglobin: 9.5 g/dL — ABNORMAL LOW (ref 13.0–18.0)
Lymphocytes Relative: 18 %
Lymphs Abs: 0.6 10*3/uL — ABNORMAL LOW (ref 1.0–3.6)
MCH: 37.1 pg — ABNORMAL HIGH (ref 26.0–34.0)
MCHC: 33.9 g/dL (ref 32.0–36.0)
MCV: 109.5 fL — ABNORMAL HIGH (ref 80.0–100.0)
Monocytes Absolute: 0.2 10*3/uL (ref 0.2–1.0)
Monocytes Relative: 7 %
Neutro Abs: 2.4 10*3/uL (ref 1.4–6.5)
Neutrophils Relative %: 72 %
Platelets: 122 10*3/uL — ABNORMAL LOW (ref 150–440)
RBC: 2.57 MIL/uL — ABNORMAL LOW (ref 4.40–5.90)
RDW: 16.1 % — ABNORMAL HIGH (ref 11.5–14.5)
WBC: 3.3 10*3/uL — ABNORMAL LOW (ref 3.8–10.6)

## 2015-07-25 LAB — RETICULOCYTES
RBC.: 2.57 MIL/uL — ABNORMAL LOW (ref 4.40–5.90)
Retic Count, Absolute: 90 10*3/uL (ref 19.0–183.0)
Retic Ct Pct: 3.5 % — ABNORMAL HIGH (ref 0.4–3.1)

## 2015-07-25 LAB — FERRITIN: Ferritin: 52 ng/mL (ref 24–336)

## 2015-07-25 LAB — COMPREHENSIVE METABOLIC PANEL
ALT: 16 U/L — ABNORMAL LOW (ref 17–63)
AST: 21 U/L (ref 15–41)
Albumin: 4.3 g/dL (ref 3.5–5.0)
Alkaline Phosphatase: 75 U/L (ref 38–126)
Anion gap: 7 (ref 5–15)
BUN: 34 mg/dL — ABNORMAL HIGH (ref 6–20)
CO2: 23 mmol/L (ref 22–32)
Calcium: 8.9 mg/dL (ref 8.9–10.3)
Chloride: 101 mmol/L (ref 101–111)
Creatinine, Ser: 1.4 mg/dL — ABNORMAL HIGH (ref 0.61–1.24)
GFR calc Af Amer: 54 mL/min — ABNORMAL LOW (ref >60)
GFR calc non Af Amer: 47 mL/min — ABNORMAL LOW (ref >60)
Glucose, Bld: 237 mg/dL — ABNORMAL HIGH (ref 65–99)
Potassium: 4 mmol/L (ref 3.5–5.1)
Sodium: 131 mmol/L — ABNORMAL LOW (ref 135–145)
Total Bilirubin: 0.8 mg/dL (ref 0.3–1.2)
Total Protein: 8 g/dL (ref 6.5–8.1)

## 2015-07-25 LAB — FOLATE: Folate: 60.5 ng/mL (ref >5.9)

## 2015-07-25 LAB — IRON AND TIBC
Iron: 147 ug/dL (ref 45–182)
Saturation Ratios: 42 % — ABNORMAL HIGH (ref 17.9–39.5)
TIBC: 352 ug/dL (ref 250–450)
UIBC: 205 ug/dL

## 2015-07-25 LAB — VITAMIN B12: Vitamin B-12: 894 pg/mL (ref 180–914)

## 2015-07-25 LAB — TSH: TSH: 2.562 u[IU]/mL (ref 0.350–4.500)

## 2015-07-25 NOTE — Progress Notes (Signed)
[facility] day:  07/25/2015  Chief Complaint: Alex Smith is a 80 y.o. male with recurrent hairy cell leukemia who is seen for assessment on day 145 s/p cladribine.  HPI:  The patient was last seen in the medical oncology clinic on 06/26/2015.  At that time, he was day 116 s/p cladribine. Symptomatically, he felt that his energy level was "average".  He denied any fevers or infections.  He noted some shortness of breath with bending over.  He was working 20 hours a week.  Labs included a hematocrit of 26.3, hemoglobin 8.9, platelets 103,000, white blood count 2400 with an ANC of 1500.  During the interim, the patient notes that he is still fatigued. He remains active. He has any fevers or infections. He denies any bruising or bleeding.  Past Medical History  Diagnosis Date  . Hypertension   . GERD (gastroesophageal reflux disease)   . HLD (hyperlipidemia)   . History of pneumonia 2000's    "once" (07/07/2012)  . Type II diabetes mellitus ([place])   . Hairy cell leukemia ([place]) 2006    recurrent, seizure on rituxan, now on cladribine ([place])  . Basal cell carcinoma of face   . Diverticulosis   . Colon polyps   . BPH (benign prostatic hypertrophy)     followed by urology, discharged (Dr. Bernardo Heater)  . B12 deficiency   . History of shingles   . Shortness of breath dyspnea   . Pneumonia   . CAP (community acquired pneumonia) 02/15/2015    Past Surgical History  Procedure Laterality Date  . Eye surgery Left 06/2012    laser surgery  . Cataract extraction w/ intraocular lens  implant, bilateral  ~ 2010  . Skin cancer excision      "all over my face" (07/07/2012)  . 25 gauge pars plana vitrectomy with 20 gauge mvr port for macular hole  07/07/2012    Procedure: 25 GAUGE PARS PLANA VITRECTOMY WITH 20 GAUGE MVR PORT FOR MACULAR HOLE;  Surgeon: Hayden Pedro, MD;  Location: [facility];  Service: Ophthalmology;  Laterality: Left;  . Serum patch   07/07/2012    Procedure: SERUM PATCH;  Surgeon: Hayden Pedro, MD;  Location: [place];  Service: Ophthalmology;  Laterality: Left;  . Gas insertion  07/07/2012    Procedure: INSERTION OF GAS;  Surgeon: Hayden Pedro, MD;  Location: [place] [place];  Service: Ophthalmology;  Laterality: Left;  [id number]  . Cardiovascular stress test  2013    treadmill - no evidence ischemia, EF 61%  . Colonoscopy  2014    [place] WNL no rpt needed, h/o polyps  . Peripheral vascular catheterization N/A 02/23/2015    Procedure: Glori Luis Cath Insertion;  Surgeon: Algernon Huxley, MD;  Location: [facility];  Service: Cardiovascular;  Laterality: N/A;    Family History  Problem Relation Age of Onset  . Dementia Mother   . Heart failure Father 21  . Cancer Sister     breast  . Diabetes Paternal Uncle   . Diabetes Paternal Aunt   . CAD Brother 75    MI  . Stroke Neg Hx     Social History:  reports that he quit smoking about 46 years ago. His smoking use included Cigarettes. He has a 50 pack-year smoking history. He has never used smokeless tobacco. He reports that he drinks alcohol. He reports that he does not use illicit drugs.  He works at [employer] 20 hours/week.  The patient 's wife has had 3 hip operations.  Allergies:  Allergies  Allergen Reactions  . Rituximab Rash    Chest tightness  . Blood-Group Specific Substance Other (See Comments)    Had a post transfusion reaction of red blood cells; NOW REQUIRES WASHED BLOOD CELLS  . Primaxin [Imipenem] Other (See Comments)    Possible allergy  . Sulfa Antibiotics Itching and Rash  Possible allergy to Zoloft, allopurinol, and a chemotherapy medication.  He tolerates Dapsone.  Current Medications: Current Outpatient Prescriptions  Medication Sig Dispense Refill  . acyclovir (ZOVIRAX) 400 MG tablet Take 800 mg by mouth 2 (two) times daily.    Marland Kitchen aspirin EC 81 MG tablet Take 81 mg by mouth daily.    Marland Kitchen glucose blood (ACCU-CHEK AVIVA PLUS) test strip Use to check  sugar twice daily Dx:E11.9 200 each 3  . HUMULIN R 100 UNIT/ML injection INJCET 10 UNITS SUBCUTANEOUSLY TWICE DAILY BEFORE MEALS 10 mL 11  . hydrochlorothiazide (HYDRODIURIL) 25 MG tablet Take 1 tablet (25 mg total) by mouth daily.    . hydrochlorothiazide (HYDRODIURIL) 25 MG tablet TAKE ONE TABLET EVERY DAY 30 tablet 11  . insulin NPH Human (HUMULIN N) 100 UNIT/ML injection Inject 0.35 mLs (35 Units total) into the skin 2 (two) times daily before a meal. 20 mL 11  . insulin NPH Human (HUMULIN N) 100 UNIT/ML injection Inject 0.35 mLs (35 Units total) into the skin 2 (two) times daily before a meal. 20 mL 11  . lidocaine-prilocaine (EMLA) cream Apply 1 application topically as needed. 30 g 3  . lisinopril (PRINIVIL,ZESTRIL) 20 MG tablet TAKE ONE TABLET BY MOUTH TWICE DAILY 60 tablet 6  . metFORMIN (GLUCOPHAGE) 1000 MG tablet TAKE ONE TABLET BY MOUTH TWICE DAILY AS DIRECTED 180 tablet 3  . MICROLET LANCETS MISC USE AS DIRECTED 200 each 3  . omeprazole (PRILOSEC) 20 MG capsule Take 20 mg by mouth daily.     . ondansetron (ZOFRAN) 8 MG tablet Take 1 tablet (8 mg total) by mouth every 8 (eight) hours as needed for nausea or vomiting. 20 tablet 0  . pravastatin (PRAVACHOL) 20 MG tablet TAKE ONE TABLET BY MOUTH AT BEDTIME 30 tablet 11  . tamsulosin (FLOMAX) 0.4 MG CAPS capsule Take 0.4 mg by mouth daily.    . vitamin B-12 (CYANOCOBALAMIN) 1000 MCG tablet Take 1,000 mcg by mouth daily.    . vitamin E 400 UNIT capsule Take 200 Units by mouth daily.     . dapsone 100 MG tablet TAKE ONE TABLET BY MOUTH EVERY DAY 30 tablet 11   No current facility-administered medications for this visit.   Facility-Administered Medications Ordered in Other Visits  Medication Dose Route Frequency Provider Last Rate Last Dose  . heparin lock flush 100 unit/mL  500 Units Intravenous Once Lequita Asal, MD      . sodium chloride 0.9 % injection 10 mL  10 mL Intravenous PRN Lequita Asal, MD   10 mL at 03/03/15 0903   . sodium chloride 0.9 % injection 10 mL  10 mL Intracatheter PRN Lequita Asal, MD   10 mL at 03/10/15 1410   Review of Systems:  GENERAL:  Mild fatigue.  No fevers or sweats.  Weight stable. PERFORMANCE STATUS (ECOG):  1 HEENT:  No visual changes, runny nose, sore throat. Pulmonary:  No shortness of breath or cough.  No hemoptysis. Cardiac:  No chest pain, palpitations, orthopnea, or PND. GI:  Constipation (uses  Miralax).  No nausea, vomiting, diarrhea, melena or hematochezia. GU:  No urgency, frequency, dysuria, or hematuria. Musculoskeletal:  No back pain.  No joint pain.  No muscle tenderness. Extremities:  No pain or swelling. Skin:  No rashes or skin changes. Neuro:  No headache, numbness or weakness, balance or coordination issues. Endocrine:  Diabetes.  No thyroid issues, hot flashes or night sweats. Psych:  No mood changes, depression or anxiety. Pain:  No focal pain. Review of systems:  All other systems reviewed and found to be negative.   Physical Exam: Blood pressure 176/78, pulse 101, temperature 98 F (36.7 C), temperature source Tympanic, resp. rate 18, height _0  (1.702 m), weight 214 lb 4.6 oz (97.2 kg).  GENERAL:  Well developed, well nourished, sitting comfortably in the exam room in no acute distress. MENTAL STATUS:  Alert and oriented to person, place and time. HEAD:  Pearline Cables short hair.  Normocephalic, atraumatic, face symmetric, no Cushingoid features. EYES:  Glasses. Blue eyes.  Pupils equal round and reactive to light and accomodation.  No conjunctivitis or scleral icterus. ENT:  Oropharynx clear without lesion.  Tongue normal. Mucous membranes moist.  RESPIRATORY:  Clear to auscultation without rales, wheezes or rhonchi. CARDIOVASCULAR:  Regular rate and rhythm without murmur, rub or gallop. ABDOMEN:  Soft, non-tender, with active bowel sounds, and no hepatosplenomegaly.  No masses. SKIN:  Abdominal bruises secondary to injections.  No rashes, ulcers or  lesions. EXTREMITIES: No edema, no skin discoloration or tenderness.  No palpable cords. LYMPH NODES: No palpable cervical, supraclavicular, axillary or inguinal adenopathy  NEUROLOGICAL: Unremarkable. PSYCH:  Appropriate.   Appointment on 07/25/2015  Component Date Value Ref Range Status  . WBC 07/25/2015 3.3* 3.8 - 10.6 K/uL Final  . RBC 07/25/2015 2.57* 4.40 - 5.90 MIL/uL Final  . Hemoglobin 07/25/2015 9.5* 13.0 - 18.0 g/dL Final  . HCT 07/25/2015 28.1* 40.0 - 52.0 % Final  . MCV 07/25/2015 109.5* 80.0 - 100.0 fL Final  . MCH 07/25/2015 37.1* 26.0 - 34.0 pg Final  . MCHC 07/25/2015 33.9  32.0 - 36.0 g/dL Final  . RDW 07/25/2015 16.1* 11.5 - 14.5 % Final  . Platelets 07/25/2015 122* 150 - 440 K/uL Final  . Neutrophils Relative % 07/25/2015 72   Final  . Neutro Abs 07/25/2015 2.4  1.4 - 6.5 K/uL Final  . Lymphocytes Relative 07/25/2015 18   Final  . Lymphs Abs 07/25/2015 0.6* 1.0 - 3.6 K/uL Final  . Monocytes Relative 07/25/2015 7   Final  . Monocytes Absolute 07/25/2015 0.2  0.2 - 1.0 K/uL Final  . Eosinophils Relative 07/25/2015 2   Final  . Eosinophils Absolute 07/25/2015 0.1  0 - 0.7 K/uL Final  . Basophils Relative 07/25/2015 1   Final  . Basophils Absolute 07/25/2015 0.0  0 - 0.1 K/uL Final  . Sodium 07/25/2015 131* 135 - 145 mmol/L Final  . Potassium 07/25/2015 4.0  3.5 - 5.1 mmol/L Final  . Chloride 07/25/2015 101  101 - 111 mmol/L Final  . CO2 07/25/2015 23  22 - 32 mmol/L Final  . Glucose, Bld 07/25/2015 237* 65 - 99 mg/dL Final  . BUN 07/25/2015 34* 6 - 20 mg/dL Final  . Creatinine, Ser 07/25/2015 1.40* 0.61 - 1.24 mg/dL Final  . Calcium 07/25/2015 8.9  8.9 - 10.3 mg/dL Final  . Total Protein 07/25/2015 8.0  6.5 - 8.1 g/dL Final  . Albumin 07/25/2015 4.3  3.5 - 5.0 g/dL Final  . AST 07/25/2015 21  15 -  41 U/L Final  . ALT 07/25/2015 16* 17 - 63 U/L Final  . Alkaline Phosphatase 07/25/2015 75  38 - 126 U/L Final  . Total Bilirubin 07/25/2015 0.8  0.3 - 1.2 mg/dL  Corrected   CORRECTED ON 12/06 AT 1604: PREVIOUSLY REPORTED AS Null_Result BO  . GFR calc non Af Amer 07/25/2015 47* >60 mL/min Final  . GFR calc Af Amer 07/25/2015 54* >60 mL/min Final   Comment: (NOTE) The eGFR has been calculated using the CKD EPI equation. This calculation has not been validated in all clinical situations. eGFR's persistently <60 mL/min signify possible Chronic Kidney Disease.   . Anion gap 07/25/2015 7  5 - 15 Final  . Retic Ct Pct 07/25/2015 3.5* 0.4 - 3.1 % Final  . RBC. 07/25/2015 2.57* 4.40 - 5.90 MIL/uL Final  . Retic Count, Manual 07/25/2015 90.0  19.0 - 183.0 K/uL Final  . Ferritin 07/25/2015 52  24 - 336 ng/mL Final  . Iron 07/25/2015 147  45 - 182 ug/dL Final  . TIBC 07/25/2015 352  250 - 450 ug/dL Final  . Saturation Ratios 07/25/2015 42* 17.9 - 39.5 % Final  . UIBC 07/25/2015 205   Final  . Vitamin B-12 07/25/2015 894  180 - 914 pg/mL Final   Comment: (NOTE) This assay is not validated for testing neonatal or myeloproliferative syndrome specimens for Vitamin B12 levels. Performed at [facility]   . Folate 07/25/2015 60.5  >5.9 ng/mL Corrected   Comment: RESULTS CONFIRMED BY MANUAL DILUTION CORRECTED ON 12/06 AT 2152: PREVIOUSLY REPORTED AS >47.0   . TSH 07/25/2015 2.562  0.350 - 4.500 uIU/mL Final  . Total Protein ELP 07/25/2015 7.3  6.0 - 8.5 g/dL Final  . Albumin ELP 07/25/2015 3.8  2.9 - 4.4 g/dL Final  . Alpha-1-Globulin 07/25/2015 0.2  0.0 - 0.4 g/dL Final  . Alpha-2-Globulin 07/25/2015 0.7  0.4 - 1.0 g/dL Final  . Beta Globulin 07/25/2015 0.9  0.7 - 1.3 g/dL Final  . Gamma Globulin 07/25/2015 1.6  0.4 - 1.8 g/dL Final  . M-Spike, % 07/25/2015 1.4* Not Observed g/dL Final  . SPE Interp. 07/25/2015 Comment   Final   Comment: (NOTE) The SPE pattern demonstrates a single peak (M-spike) in the gamma region which may represent monoclonal protein. This peak may also be caused by circulating immune complexes, cryoglobulins,  C-reactive protein, fibrinogen or hemolysis.  If clinically indicated, the presence of a monoclonal gammopathy may be confirmed by immuno- fixation, as well as an evaluation of the urine for the presence of Bence-Jones protein. Performed At: [facility] [place], [place] [id number] Lindon Romp MD OJ:[id number]   . Comment 07/25/2015 Comment   Final   Comment: (NOTE) Protein electrophoresis scan will follow via computer, mail, or courier delivery.   Marland Kitchen GLOBULIN, TOTAL 07/25/2015 3.5  2.2 - 3.9 g/dL Corrected  . A/G Ratio 07/25/2015 1.1  0.7 - 1.7 Corrected  . Kappa free light chain 07/25/2015 46.23* 3.30 - 19.40 mg/L Final  . Lamda free light chains 07/25/2015 12.56  5.71 - 26.30 mg/L Final  . Kappa, lamda light chain ratio 07/25/2015 3.68* 0.26 - 1.65 Final   Comment: (NOTE) Performed At: [facility] [address] [place], [place] [id number] Lindon Romp MD LF:[id number]     Assessment:  Alex Smith is a 80 y.o. male with recurrent hairy cell leukemia.  He was diagnosed with hairy cell leukemia in 2006.  He received cladribine 11/06-11/05/2005.  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  Bone marrow aspirate and biopsy on 02/07/2015 revealed a extensive marrow involvement by recurrent hairy cell leukemia (approximately 80% of cells in the core). There was a monoclonal plasma cell infiltrate (approximately 10%), compatible with a plasma cell neoplasm. Marrow was hypercellular (40-50%) with markedly diminished residual trilineage hematopoiesis and mild multilineage dyspoiesis.  Peripheral smear revealed leukopenia (WBC 1900) with 2% atypical lymphoid cells compatible with hairy cell leukemia. There was moderate anemia with anisopoikilocytosis. 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  FISH studies for MDS were negative.  Cytogenetics were  normal (46, XY).  He has an unclear allergy history.  He may be allergic to Primaxin, Zoloft, allopurinol, and a chemotherapy medication.  Reaction occurred in 06/2005.  He is allergic to sulfa.  He is not allergic to dapsone.  He requires washed RBCs.  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  He completed a course of Levaquin for pneumonia.  He is on prophylactic acyclovir and dapsone.  Voriconazole was discontinued.  He is currently day 145 status post cladribine (began 03/03/2015).    Symptomatically, he remains fatigued.  Exam is stable.  Counts have not recovered.  Hematocrit is 28.1, platelets are 122,000, and white count 3300 (ANC 2400).  Creatinine is 1.0 (CrCl 47 ml/min).   Plan: 1.  Labs today:  CBC with diff, CMP,retic, ferritin, iron studies, B12, folate, TSH, SPEP, free light chains. 2.  Discuss additional evaluation of blood counts today. 3.  RTC in 1 month for labs (CBC with diff) 4.  RTC in 2 months for MD assessment and labs (CBC with diff, CMP).   Lequita Asal, MD  07/25/2015, 3:33 PM

## 2015-07-25 NOTE — Progress Notes (Signed)
Patient is here for follow-up of hairy cell leukemia. He states that he stays fatigued most of the time.

## 2015-07-26 ENCOUNTER — Ambulatory Visit (INDEPENDENT_AMBULATORY_CARE_PROVIDER_SITE_OTHER): Payer: Medicare Other | Admitting: Ophthalmology

## 2015-07-26 DIAGNOSIS — H35033 Hypertensive retinopathy, bilateral: Secondary | ICD-10-CM | POA: Diagnosis not present

## 2015-07-26 DIAGNOSIS — E11319 Type 2 diabetes mellitus with unspecified diabetic retinopathy without macular edema: Secondary | ICD-10-CM | POA: Diagnosis not present

## 2015-07-26 DIAGNOSIS — E113292 Type 2 diabetes mellitus with mild nonproliferative diabetic retinopathy without macular edema, left eye: Secondary | ICD-10-CM

## 2015-07-26 DIAGNOSIS — H43813 Vitreous degeneration, bilateral: Secondary | ICD-10-CM

## 2015-07-26 DIAGNOSIS — H35342 Macular cyst, hole, or pseudohole, left eye: Secondary | ICD-10-CM | POA: Diagnosis not present

## 2015-07-26 DIAGNOSIS — E113391 Type 2 diabetes mellitus with moderate nonproliferative diabetic retinopathy without macular edema, right eye: Secondary | ICD-10-CM | POA: Diagnosis not present

## 2015-07-26 DIAGNOSIS — I10 Essential (primary) hypertension: Secondary | ICD-10-CM | POA: Diagnosis not present

## 2015-07-26 LAB — PROTEIN ELECTROPHORESIS, SERUM
A/G Ratio: 1.1 (ref 0.7–1.7)
Albumin ELP: 3.8 g/dL (ref 2.9–4.4)
Alpha-1-Globulin: 0.2 g/dL (ref 0.0–0.4)
Alpha-2-Globulin: 0.7 g/dL (ref 0.4–1.0)
Beta Globulin: 0.9 g/dL (ref 0.7–1.3)
Gamma Globulin: 1.6 g/dL (ref 0.4–1.8)
Globulin, Total: 3.5 g/dL (ref 2.2–3.9)
M-Spike, %: 1.4 g/dL — ABNORMAL HIGH
Total Protein ELP: 7.3 g/dL (ref 6.0–8.5)

## 2015-07-26 LAB — KAPPA/LAMBDA LIGHT CHAINS
Kappa free light chain: 46.23 mg/L — ABNORMAL HIGH (ref 3.30–19.40)
Kappa, lambda light chain ratio: 3.68 — ABNORMAL HIGH (ref 0.26–1.65)
Lambda free light chains: 12.56 mg/L (ref 5.71–26.30)

## 2015-08-07 ENCOUNTER — Other Ambulatory Visit: Payer: Self-pay | Admitting: Family Medicine

## 2015-08-08 ENCOUNTER — Ambulatory Visit: Payer: Self-pay

## 2015-08-15 ENCOUNTER — Inpatient Hospital Stay: Payer: Medicare Other

## 2015-08-15 DIAGNOSIS — Z452 Encounter for adjustment and management of vascular access device: Secondary | ICD-10-CM | POA: Diagnosis not present

## 2015-08-15 DIAGNOSIS — Z86010 Personal history of colonic polyps: Secondary | ICD-10-CM | POA: Diagnosis not present

## 2015-08-15 DIAGNOSIS — Z87891 Personal history of nicotine dependence: Secondary | ICD-10-CM | POA: Diagnosis not present

## 2015-08-15 DIAGNOSIS — Z85828 Personal history of other malignant neoplasm of skin: Secondary | ICD-10-CM | POA: Diagnosis not present

## 2015-08-15 DIAGNOSIS — Z8701 Personal history of pneumonia (recurrent): Secondary | ICD-10-CM | POA: Diagnosis not present

## 2015-08-15 DIAGNOSIS — E785 Hyperlipidemia, unspecified: Secondary | ICD-10-CM | POA: Diagnosis not present

## 2015-08-15 DIAGNOSIS — Z7984 Long term (current) use of oral hypoglycemic drugs: Secondary | ICD-10-CM | POA: Diagnosis not present

## 2015-08-15 DIAGNOSIS — I10 Essential (primary) hypertension: Secondary | ICD-10-CM | POA: Diagnosis not present

## 2015-08-15 DIAGNOSIS — K219 Gastro-esophageal reflux disease without esophagitis: Secondary | ICD-10-CM | POA: Diagnosis not present

## 2015-08-15 DIAGNOSIS — E119 Type 2 diabetes mellitus without complications: Secondary | ICD-10-CM | POA: Diagnosis not present

## 2015-08-15 DIAGNOSIS — D61818 Other pancytopenia: Secondary | ICD-10-CM | POA: Diagnosis not present

## 2015-08-15 DIAGNOSIS — Z7982 Long term (current) use of aspirin: Secondary | ICD-10-CM | POA: Diagnosis not present

## 2015-08-15 DIAGNOSIS — C9142 Hairy cell leukemia, in relapse: Secondary | ICD-10-CM

## 2015-08-15 DIAGNOSIS — N400 Enlarged prostate without lower urinary tract symptoms: Secondary | ICD-10-CM | POA: Diagnosis not present

## 2015-08-15 DIAGNOSIS — Z79899 Other long term (current) drug therapy: Secondary | ICD-10-CM | POA: Diagnosis not present

## 2015-08-15 DIAGNOSIS — E538 Deficiency of other specified B group vitamins: Secondary | ICD-10-CM | POA: Diagnosis not present

## 2015-08-15 DIAGNOSIS — Z882 Allergy status to sulfonamides status: Secondary | ICD-10-CM | POA: Diagnosis not present

## 2015-08-15 DIAGNOSIS — R5383 Other fatigue: Secondary | ICD-10-CM | POA: Diagnosis not present

## 2015-08-15 DIAGNOSIS — C9140 Hairy cell leukemia not having achieved remission: Secondary | ICD-10-CM | POA: Diagnosis not present

## 2015-08-15 MED ORDER — SODIUM CHLORIDE 0.9 % IJ SOLN
10.0000 mL | Freq: Once | INTRAMUSCULAR | Status: AC
  Administered 2015-08-15: 10 mL via INTRAVENOUS
  Filled 2015-08-15: qty 10

## 2015-08-15 MED ORDER — HEPARIN SOD (PORK) LOCK FLUSH 100 UNIT/ML IV SOLN
500.0000 [IU] | Freq: Once | INTRAVENOUS | Status: AC
  Administered 2015-08-15: 500 [IU] via INTRAVENOUS

## 2015-08-15 MED ORDER — HEPARIN SOD (PORK) LOCK FLUSH 100 UNIT/ML IV SOLN
INTRAVENOUS | Status: AC
  Filled 2015-08-15: qty 5

## 2015-08-15 NOTE — Progress Notes (Signed)
Survivorship Care Plan completed.  Treatment summary reviewed and given to patient.  ASCO answers booklet reviewed and given to patient.  CARE program and Cancer Transitions discussed with patient along with other resources the [facility] provides.  Patient verbalized understanding.

## 2015-08-25 ENCOUNTER — Encounter: Payer: Self-pay | Admitting: Hematology and Oncology

## 2015-08-25 ENCOUNTER — Inpatient Hospital Stay: Payer: Medicare Other | Attending: Hematology and Oncology

## 2015-08-25 DIAGNOSIS — C9142 Hairy cell leukemia, in relapse: Secondary | ICD-10-CM | POA: Diagnosis not present

## 2015-08-25 LAB — CBC WITH DIFFERENTIAL/PLATELET
Basophils Absolute: 0 10*3/uL (ref 0–0.1)
Basophils Relative: 1 %
Eosinophils Absolute: 0.1 10*3/uL (ref 0–0.7)
Eosinophils Relative: 4 %
HCT: 27.3 % — ABNORMAL LOW (ref 40.0–52.0)
Hemoglobin: 9.2 g/dL — ABNORMAL LOW (ref 13.0–18.0)
Lymphocytes Relative: 26 %
Lymphs Abs: 0.8 10*3/uL — ABNORMAL LOW (ref 1.0–3.6)
MCH: 38.2 pg — ABNORMAL HIGH (ref 26.0–34.0)
MCHC: 33.8 g/dL (ref 32.0–36.0)
MCV: 113.1 fL — ABNORMAL HIGH (ref 80.0–100.0)
Monocytes Absolute: 0.3 10*3/uL (ref 0.2–1.0)
Monocytes Relative: 10 %
Neutro Abs: 1.8 10*3/uL (ref 1.4–6.5)
Neutrophils Relative %: 59 %
Platelets: 124 10*3/uL — ABNORMAL LOW (ref 150–440)
RBC: 2.41 MIL/uL — ABNORMAL LOW (ref 4.40–5.90)
RDW: 16.2 % — ABNORMAL HIGH (ref 11.5–14.5)
WBC: 3.1 10*3/uL — ABNORMAL LOW (ref 3.8–10.6)

## 2015-08-29 ENCOUNTER — Other Ambulatory Visit: Payer: Self-pay | Admitting: Family Medicine

## 2015-09-04 ENCOUNTER — Other Ambulatory Visit: Payer: Self-pay | Admitting: Family Medicine

## 2015-09-26 ENCOUNTER — Inpatient Hospital Stay (HOSPITAL_BASED_OUTPATIENT_CLINIC_OR_DEPARTMENT_OTHER): Payer: Medicare Other | Admitting: Family Medicine

## 2015-09-26 ENCOUNTER — Ambulatory Visit: Payer: Self-pay | Admitting: Hematology and Oncology

## 2015-09-26 ENCOUNTER — Encounter: Payer: Self-pay | Admitting: Family Medicine

## 2015-09-26 ENCOUNTER — Other Ambulatory Visit: Payer: Self-pay

## 2015-09-26 ENCOUNTER — Inpatient Hospital Stay: Payer: Medicare Other

## 2015-09-26 ENCOUNTER — Inpatient Hospital Stay: Payer: Medicare Other | Attending: Family Medicine

## 2015-09-26 VITALS — BP 159/78 | HR 89 | Temp 98.2°F | Resp 18 | Ht 67.0 in | Wt 216.1 lb

## 2015-09-26 DIAGNOSIS — Z808 Family history of malignant neoplasm of other organs or systems: Secondary | ICD-10-CM | POA: Diagnosis not present

## 2015-09-26 DIAGNOSIS — Z79899 Other long term (current) drug therapy: Secondary | ICD-10-CM

## 2015-09-26 DIAGNOSIS — Z87891 Personal history of nicotine dependence: Secondary | ICD-10-CM

## 2015-09-26 DIAGNOSIS — K219 Gastro-esophageal reflux disease without esophagitis: Secondary | ICD-10-CM

## 2015-09-26 DIAGNOSIS — D61818 Other pancytopenia: Secondary | ICD-10-CM

## 2015-09-26 DIAGNOSIS — Z85828 Personal history of other malignant neoplasm of skin: Secondary | ICD-10-CM | POA: Insufficient documentation

## 2015-09-26 DIAGNOSIS — C9140 Hairy cell leukemia not having achieved remission: Secondary | ICD-10-CM | POA: Diagnosis not present

## 2015-09-26 DIAGNOSIS — K5790 Diverticulosis of intestine, part unspecified, without perforation or abscess without bleeding: Secondary | ICD-10-CM

## 2015-09-26 DIAGNOSIS — Z882 Allergy status to sulfonamides status: Secondary | ICD-10-CM

## 2015-09-26 DIAGNOSIS — R5383 Other fatigue: Secondary | ICD-10-CM | POA: Diagnosis not present

## 2015-09-26 DIAGNOSIS — Z86010 Personal history of colonic polyps: Secondary | ICD-10-CM

## 2015-09-26 DIAGNOSIS — E119 Type 2 diabetes mellitus without complications: Secondary | ICD-10-CM

## 2015-09-26 DIAGNOSIS — Z7984 Long term (current) use of oral hypoglycemic drugs: Secondary | ICD-10-CM | POA: Diagnosis not present

## 2015-09-26 DIAGNOSIS — C9142 Hairy cell leukemia, in relapse: Secondary | ICD-10-CM

## 2015-09-26 DIAGNOSIS — E785 Hyperlipidemia, unspecified: Secondary | ICD-10-CM | POA: Diagnosis not present

## 2015-09-26 DIAGNOSIS — I10 Essential (primary) hypertension: Secondary | ICD-10-CM | POA: Diagnosis not present

## 2015-09-26 DIAGNOSIS — Z7982 Long term (current) use of aspirin: Secondary | ICD-10-CM | POA: Insufficient documentation

## 2015-09-26 DIAGNOSIS — N400 Enlarged prostate without lower urinary tract symptoms: Secondary | ICD-10-CM | POA: Insufficient documentation

## 2015-09-26 DIAGNOSIS — Z8701 Personal history of pneumonia (recurrent): Secondary | ICD-10-CM | POA: Insufficient documentation

## 2015-09-26 DIAGNOSIS — E538 Deficiency of other specified B group vitamins: Secondary | ICD-10-CM | POA: Insufficient documentation

## 2015-09-26 DIAGNOSIS — C9141 Hairy cell leukemia, in remission: Secondary | ICD-10-CM

## 2015-09-26 DIAGNOSIS — Z794 Long term (current) use of insulin: Secondary | ICD-10-CM

## 2015-09-26 DIAGNOSIS — Z95828 Presence of other vascular implants and grafts: Secondary | ICD-10-CM

## 2015-09-26 LAB — COMPREHENSIVE METABOLIC PANEL
ALT: 18 U/L (ref 17–63)
AST: 20 U/L (ref 15–41)
Albumin: 4.1 g/dL (ref 3.5–5.0)
Alkaline Phosphatase: 69 U/L (ref 38–126)
Anion gap: 7 (ref 5–15)
BUN: 29 mg/dL — ABNORMAL HIGH (ref 6–20)
CO2: 22 mmol/L (ref 22–32)
Calcium: 8.7 mg/dL — ABNORMAL LOW (ref 8.9–10.3)
Chloride: 104 mmol/L (ref 101–111)
Creatinine, Ser: 1.13 mg/dL (ref 0.61–1.24)
GFR calc Af Amer: >60 mL/min (ref >60)
GFR calc non Af Amer: >60 mL/min (ref >60)
Glucose, Bld: 258 mg/dL — ABNORMAL HIGH (ref 65–99)
Potassium: 4.1 mmol/L (ref 3.5–5.1)
Sodium: 133 mmol/L — ABNORMAL LOW (ref 135–145)
Total Bilirubin: 0.8 mg/dL (ref 0.3–1.2)
Total Protein: 7.1 g/dL (ref 6.5–8.1)

## 2015-09-26 LAB — CBC WITH DIFFERENTIAL/PLATELET
Basophils Absolute: 0 10*3/uL (ref 0–0.1)
Basophils Relative: 1 %
Eosinophils Absolute: 0.1 10*3/uL (ref 0–0.7)
Eosinophils Relative: 5 %
HCT: 25 % — ABNORMAL LOW (ref 40.0–52.0)
Hemoglobin: 8.6 g/dL — ABNORMAL LOW (ref 13.0–18.0)
Lymphocytes Relative: 20 %
Lymphs Abs: 0.5 10*3/uL — ABNORMAL LOW (ref 1.0–3.6)
MCH: 39 pg — ABNORMAL HIGH (ref 26.0–34.0)
MCHC: 34.3 g/dL (ref 32.0–36.0)
MCV: 113.7 fL — ABNORMAL HIGH (ref 80.0–100.0)
Monocytes Absolute: 0.2 10*3/uL (ref 0.2–1.0)
Monocytes Relative: 8 %
Neutro Abs: 1.7 10*3/uL (ref 1.4–6.5)
Neutrophils Relative %: 66 %
Platelets: 103 10*3/uL — ABNORMAL LOW (ref 150–440)
RBC: 2.2 MIL/uL — ABNORMAL LOW (ref 4.40–5.90)
RDW: 15 % — ABNORMAL HIGH (ref 11.5–14.5)
WBC: 2.5 10*3/uL — ABNORMAL LOW (ref 3.8–10.6)

## 2015-09-26 MED ORDER — SODIUM CHLORIDE 0.9% FLUSH
10.0000 mL | INTRAVENOUS | Status: DC | PRN
  Administered 2015-09-26: 10 mL via INTRAVENOUS
  Filled 2015-09-26: qty 10

## 2015-09-26 MED ORDER — HEPARIN SOD (PORK) LOCK FLUSH 100 UNIT/ML IV SOLN
500.0000 [IU] | Freq: Once | INTRAVENOUS | Status: AC
  Administered 2015-09-26: 500 [IU] via INTRAVENOUS

## 2015-09-26 NOTE — Progress Notes (Signed)
[facility] day:  09/26/2015  Chief Complaint: Alex Smith is a 81 y.o. male with recurrent hairy cell leukemia who is seen for assessment on day 145 s/p cladribine.  HPI:  The patient was last seen in the medical oncology clinic on 07/25/2015.  At that time, he was day 116 s/p cladribine. Symptomatically, he felt that his energy level was "average".  He denied any fevers or infections.  Reports feeling at his baseline. He was working 20 hours a week.  Labs included a hematocrit of 28.1, hemoglobin 9.5, platelets 122,000, white blood count 3300 with an ANC of 2400.  During the interim, the patient notes that he is still fatigued. He remains active. He denies any fevers or infections. He denies any bruising or bleeding.  Past Medical History  Diagnosis Date  . Hypertension   . GERD (gastroesophageal reflux disease)   . HLD (hyperlipidemia)   . History of pneumonia 2000's    "once" (07/07/2012)  . Type II diabetes mellitus ([place])   . Hairy cell leukemia ([place]) 2006    recurrent, seizure on rituxan, now on cladribine ([place])  . Basal cell carcinoma of face   . Diverticulosis   . Colon polyps   . BPH (benign prostatic hypertrophy)     followed by urology, discharged (Dr. Bernardo Heater)  . B12 deficiency   . History of shingles   . Shortness of breath dyspnea   . Pneumonia   . CAP (community acquired pneumonia) 02/15/2015    Past Surgical History  Procedure Laterality Date  . Eye surgery Left 06/2012    laser surgery  . Cataract extraction w/ intraocular lens  implant, bilateral  ~ 2010  . Skin cancer excision      "all over my face" (07/07/2012)  . 25 gauge pars plana vitrectomy with 20 gauge mvr port for macular hole  07/07/2012    Procedure: 25 GAUGE PARS PLANA VITRECTOMY WITH 20 GAUGE MVR PORT FOR MACULAR HOLE;  Surgeon: Hayden Pedro, MD;  Location: [place];  Service: Ophthalmology;  Laterality: Left;  . Serum patch  07/07/2012     Procedure: SERUM PATCH;  Surgeon: Hayden Pedro, MD;  Location: [place];  Service: Ophthalmology;  Laterality: Left;  . Gas insertion  07/07/2012    Procedure: INSERTION OF GAS;  Surgeon: Hayden Pedro, MD;  Location: [place];  Service: Ophthalmology;  Laterality: Left;  [id number]  . Cardiovascular stress test  2013    treadmill - no evidence ischemia, EF 61%  . Colonoscopy  2014    [place] WNL no rpt needed, h/o polyps  . Peripheral vascular catheterization N/A 02/23/2015    Procedure: Glori Luis Cath Insertion;  Surgeon: Algernon Huxley, MD;  Location: [facility];  Service: Cardiovascular;  Laterality: N/A;    Family History  Problem Relation Age of Onset  . Dementia Mother   . Heart failure Father 58  . Cancer Sister     breast  . Diabetes Paternal Uncle   . Diabetes Paternal Aunt   . CAD Brother 100    MI  . Stroke Neg Hx     Social History:  reports that he quit smoking about 46 years ago. His smoking use included Cigarettes. He has a 50 pack-year smoking history. He has never used smokeless tobacco. He reports that he drinks alcohol. He reports that he does not use illicit drugs.  He works at [employer] 20 hours/week.  The  patient 's wife has had 3 hip operations.  Allergies:  Allergies  Allergen Reactions  . Rituximab Rash    Chest tightness  . Blood-Group Specific Substance Other (See Comments)    Had a post transfusion reaction of red blood cells; NOW REQUIRES WASHED BLOOD CELLS  . Primaxin [Imipenem] Other (See Comments)    Possible allergy  . Sulfa Antibiotics Itching and Rash  Possible allergy to Zoloft, allopurinol, and a chemotherapy medication.  He tolerates Dapsone.  Current Medications: Current Outpatient Prescriptions  Medication Sig Dispense Refill  . acyclovir (ZOVIRAX) 400 MG tablet TAKE TWO TABLETS BY MOUTH TWICE DAILY 120 tablet 1  . aspirin EC 81 MG tablet Take 81 mg by mouth daily.    . dapsone 100 MG tablet TAKE ONE TABLET BY MOUTH EVERY DAY 30 tablet 11   . glucose blood (ACCU-CHEK AVIVA PLUS) test strip Use to check sugar twice daily Dx:E11.9 200 each 3  . HUMULIN R 100 UNIT/ML injection INJCET 10 UNITS SUBCUTANEOUSLY TWICE DAILY BEFORE MEALS 10 mL 11  . hydrochlorothiazide (HYDRODIURIL) 25 MG tablet TAKE ONE TABLET EVERY DAY 30 tablet 11  . insulin NPH Human (HUMULIN N) 100 UNIT/ML injection Inject 0.35 mLs (35 Units total) into the skin 2 (two) times daily before a meal. 20 mL 11  . lidocaine-prilocaine (EMLA) cream Apply 1 application topically as needed. 30 g 3  . lisinopril (PRINIVIL,ZESTRIL) 20 MG tablet TAKE ONE TABLET BY MOUTH TWICE DAILY 60 tablet 6  . metFORMIN (GLUCOPHAGE) 1000 MG tablet TAKE ONE TABLET BY MOUTH TWICE DAILY AS DIRECTED 180 tablet 3  . MICROLET LANCETS MISC USE AS DIRECTED 200 each 3  . omeprazole (PRILOSEC) 20 MG capsule Take 20 mg by mouth daily.     . ondansetron (ZOFRAN) 8 MG tablet Take 1 tablet (8 mg total) by mouth every 8 (eight) hours as needed for nausea or vomiting. 20 tablet 0  . pravastatin (PRAVACHOL) 20 MG tablet TAKE ONE TABLET BY MOUTH AT BEDTIME 30 tablet 11  . tamsulosin (FLOMAX) 0.4 MG CAPS capsule Take 0.4 mg by mouth daily.    . vitamin B-12 (CYANOCOBALAMIN) 1000 MCG tablet Take 1,000 mcg by mouth daily.    . vitamin E 400 UNIT capsule Take 200 Units by mouth daily.      No current facility-administered medications for this visit.   Facility-Administered Medications Ordered in Other Visits  Medication Dose Route Frequency Provider Last Rate Last Dose  . heparin lock flush 100 unit/mL  500 Units Intravenous Once Lequita Asal, MD      . sodium chloride 0.9 % injection 10 mL  10 mL Intravenous PRN Lequita Asal, MD   10 mL at 03/03/15 0903  . sodium chloride 0.9 % injection 10 mL  10 mL Intracatheter PRN Lequita Asal, MD   10 mL at 03/10/15 1410   Review of Systems:  GENERAL:  Mild fatigue.  No fevers or sweats.  Weight stable. PERFORMANCE STATUS (ECOG):  1 HEENT:  No visual  changes, runny nose, sore throat. Pulmonary:  No shortness of breath or cough.  No hemoptysis. Cardiac:  No chest pain, palpitations, orthopnea, or PND. GI:  Constipation (uses Miralax).  No nausea, vomiting, diarrhea, melena or hematochezia. GU:  No urgency, frequency, dysuria, or hematuria. Musculoskeletal:  No back pain.  No joint pain.  No muscle tenderness. Extremities:  No pain or swelling. Skin:  No rashes or skin changes. Neuro:  No headache, numbness or weakness, balance or coordination  issues. Endocrine:  Diabetes.  No thyroid issues, hot flashes or night sweats. Psych:  No mood changes, depression or anxiety. Pain:  No focal pain. Review of systems:  All other systems reviewed and found to be negative.   Physical Exam: Blood pressure 159/78, pulse 89, temperature 98.2 F (36.8 C), temperature source Tympanic, resp. rate 18, height _0  (1.702 m), weight 216 lb 0.8 oz (98 kg).  GENERAL:  Well developed, well nourished, sitting comfortably in the exam room in no acute distress. MENTAL STATUS:  Alert and oriented to person, place and time. HEAD:  Pearline Cables short hair.  Normocephalic, atraumatic, face symmetric, no Cushingoid features. EYES:  Glasses. Blue eyes.  Pupils equal round and reactive to light and accomodation.  No conjunctivitis or scleral icterus. ENT:  Oropharynx clear without lesion.  Tongue normal. Mucous membranes moist.  RESPIRATORY:  Clear to auscultation without rales, wheezes or rhonchi. CARDIOVASCULAR:  Regular rate and rhythm without murmur, rub or gallop. ABDOMEN:  Soft, non-tender, with active bowel sounds, and no hepatosplenomegaly.  No masses. SKIN:  Abdominal bruises secondary to injections.  No rashes, ulcers or lesions. EXTREMITIES: No edema, no skin discoloration or tenderness.  No palpable cords. LYMPH NODES: No palpable cervical, supraclavicular, axillary or inguinal adenopathy  NEUROLOGICAL: Unremarkable. PSYCH:  Appropriate.   Appointment on  09/26/2015  Component Date Value Ref Range Status  . WBC 09/26/2015 2.5* 3.8 - 10.6 K/uL Final  . RBC 09/26/2015 2.20* 4.40 - 5.90 MIL/uL Final  . Hemoglobin 09/26/2015 8.6* 13.0 - 18.0 g/dL Final  . HCT 09/26/2015 25.0* 40.0 - 52.0 % Final  . MCV 09/26/2015 113.7* 80.0 - 100.0 fL Final  . MCH 09/26/2015 39.0* 26.0 - 34.0 pg Final  . MCHC 09/26/2015 34.3  32.0 - 36.0 g/dL Final  . RDW 09/26/2015 15.0* 11.5 - 14.5 % Final  . Platelets 09/26/2015 103* 150 - 440 K/uL Final  . Neutrophils Relative % 09/26/2015 66   Final  . Neutro Abs 09/26/2015 1.7  1.4 - 6.5 K/uL Final  . Lymphocytes Relative 09/26/2015 20   Final  . Lymphs Abs 09/26/2015 0.5* 1.0 - 3.6 K/uL Final  . Monocytes Relative 09/26/2015 8   Final  . Monocytes Absolute 09/26/2015 0.2  0.2 - 1.0 K/uL Final  . Eosinophils Relative 09/26/2015 5   Final  . Eosinophils Absolute 09/26/2015 0.1  0 - 0.7 K/uL Final  . Basophils Relative 09/26/2015 1   Final  . Basophils Absolute 09/26/2015 0.0  0 - 0.1 K/uL Final  . Sodium 09/26/2015 133* 135 - 145 mmol/L Final  . Potassium 09/26/2015 4.1  3.5 - 5.1 mmol/L Final  . Chloride 09/26/2015 104  101 - 111 mmol/L Final  . CO2 09/26/2015 22  22 - 32 mmol/L Final  . Glucose, Bld 09/26/2015 258* 65 - 99 mg/dL Final  . BUN 09/26/2015 29* 6 - 20 mg/dL Final  . Creatinine, Ser 09/26/2015 1.13  0.61 - 1.24 mg/dL Final  . Calcium 09/26/2015 8.7* 8.9 - 10.3 mg/dL Final  . Total Protein 09/26/2015 7.1  6.5 - 8.1 g/dL Final  . Albumin 09/26/2015 4.1  3.5 - 5.0 g/dL Final  . AST 09/26/2015 20  15 - 41 U/L Final  . ALT 09/26/2015 18  17 - 63 U/L Final  . Alkaline Phosphatase 09/26/2015 69  38 - 126 U/L Final  . Total Bilirubin 09/26/2015 0.8  0.3 - 1.2 mg/dL Final  . GFR calc non Af Amer 09/26/2015 >60  >60 mL/min Final  .  GFR calc Af Amer 09/26/2015 >60  >60 mL/min Final   Comment: (NOTE) The eGFR has been calculated using the CKD EPI equation. This calculation has not been validated in all  clinical situations. eGFR's persistently <60 mL/min signify possible Chronic Kidney Disease.   Georgiann Hahn gap 09/26/2015 7  5 - 15 Final    Assessment:  Alex Smith is a 81 y.o. male with recurrent hairy cell leukemia.  He was diagnosed with hairy cell leukemia in 2006.  He received cladribine 11/06-11/05/2005.  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  Bone marrow aspirate and biopsy on 02/07/2015 revealed a extensive marrow involvement by recurrent hairy cell leukemia (approximately 80% of cells in the core). There was a monoclonal plasma cell infiltrate (approximately 10%), compatible with a plasma cell neoplasm. Marrow was hypercellular (40-50%) with markedly diminished residual trilineage hematopoiesis and mild multilineage dyspoiesis.  Peripheral smear revealed leukopenia (WBC 1900) with 2% atypical lymphoid cells compatible with hairy cell leukemia. There was moderate anemia with anisopoikilocytosis. 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  FISH studies for MDS were negative.  Cytogenetics were normal (46, XY).  He has an unclear allergy history.  He may be allergic to Primaxin, Zoloft, allopurinol, and a chemotherapy medication.  Reaction occurred in 06/2005.  He is allergic to sulfa.  He is not allergic to dapsone.  He requires washed RBCs.  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  He completed a course of Levaquin for pneumonia.  He is on prophylactic acyclovir and dapsone.  Voriconazole was discontinued.  He is currently day 208 status post cladribine (began 03/03/2015).    Symptomatically, he remains fatigued.  Exam is stable.  Counts have not recovered.  Hematocrit is 25.0, platelets are 103,000, and white count 2500 (ANC 1700).  Creatinine is 1.13.   Plan: 1.  Labs today:  CBC with diff 2.  Discuss additional evaluation of blood counts today. 3.  RTC in 2  weeks for labs (CBC with diff) 4.  RTC in 1 months for MD assessment and labs (CBC with diff, CMP).   Evlyn Kanner, NP  07/25/2015, 3:33 PM

## 2015-09-26 NOTE — Progress Notes (Signed)
Mr. Mizrachi is here to follow-up with his hairy cell leukemia. He has no medical complaints today.

## 2015-10-02 ENCOUNTER — Other Ambulatory Visit: Payer: Self-pay | Admitting: Family Medicine

## 2015-10-10 ENCOUNTER — Inpatient Hospital Stay: Payer: Medicare Other

## 2015-10-10 DIAGNOSIS — I10 Essential (primary) hypertension: Secondary | ICD-10-CM | POA: Diagnosis not present

## 2015-10-10 DIAGNOSIS — Z7982 Long term (current) use of aspirin: Secondary | ICD-10-CM | POA: Diagnosis not present

## 2015-10-10 DIAGNOSIS — Z79899 Other long term (current) drug therapy: Secondary | ICD-10-CM | POA: Diagnosis not present

## 2015-10-10 DIAGNOSIS — K5790 Diverticulosis of intestine, part unspecified, without perforation or abscess without bleeding: Secondary | ICD-10-CM | POA: Diagnosis not present

## 2015-10-10 DIAGNOSIS — Z808 Family history of malignant neoplasm of other organs or systems: Secondary | ICD-10-CM | POA: Diagnosis not present

## 2015-10-10 DIAGNOSIS — Z86010 Personal history of colonic polyps: Secondary | ICD-10-CM | POA: Diagnosis not present

## 2015-10-10 DIAGNOSIS — E785 Hyperlipidemia, unspecified: Secondary | ICD-10-CM | POA: Diagnosis not present

## 2015-10-10 DIAGNOSIS — Z7984 Long term (current) use of oral hypoglycemic drugs: Secondary | ICD-10-CM | POA: Diagnosis not present

## 2015-10-10 DIAGNOSIS — N400 Enlarged prostate without lower urinary tract symptoms: Secondary | ICD-10-CM | POA: Diagnosis not present

## 2015-10-10 DIAGNOSIS — E538 Deficiency of other specified B group vitamins: Secondary | ICD-10-CM | POA: Diagnosis not present

## 2015-10-10 DIAGNOSIS — C9140 Hairy cell leukemia not having achieved remission: Secondary | ICD-10-CM

## 2015-10-10 DIAGNOSIS — D61818 Other pancytopenia: Secondary | ICD-10-CM | POA: Diagnosis not present

## 2015-10-10 DIAGNOSIS — Z87891 Personal history of nicotine dependence: Secondary | ICD-10-CM | POA: Diagnosis not present

## 2015-10-10 DIAGNOSIS — R5383 Other fatigue: Secondary | ICD-10-CM | POA: Diagnosis not present

## 2015-10-10 DIAGNOSIS — K219 Gastro-esophageal reflux disease without esophagitis: Secondary | ICD-10-CM | POA: Diagnosis not present

## 2015-10-10 DIAGNOSIS — E119 Type 2 diabetes mellitus without complications: Secondary | ICD-10-CM | POA: Diagnosis not present

## 2015-10-10 DIAGNOSIS — Z882 Allergy status to sulfonamides status: Secondary | ICD-10-CM | POA: Diagnosis not present

## 2015-10-10 DIAGNOSIS — Z85828 Personal history of other malignant neoplasm of skin: Secondary | ICD-10-CM | POA: Diagnosis not present

## 2015-10-10 LAB — CBC WITH DIFFERENTIAL/PLATELET
Basophils Absolute: 0 10*3/uL (ref 0–0.1)
Basophils Relative: 1 %
Eosinophils Absolute: 0.1 10*3/uL (ref 0–0.7)
Eosinophils Relative: 4 %
HCT: 25.5 % — ABNORMAL LOW (ref 40.0–52.0)
Hemoglobin: 8.7 g/dL — ABNORMAL LOW (ref 13.0–18.0)
Lymphocytes Relative: 23 %
Lymphs Abs: 0.6 10*3/uL — ABNORMAL LOW (ref 1.0–3.6)
MCH: 38.9 pg — ABNORMAL HIGH (ref 26.0–34.0)
MCHC: 34.2 g/dL (ref 32.0–36.0)
MCV: 113.7 fL — ABNORMAL HIGH (ref 80.0–100.0)
Monocytes Absolute: 0.2 10*3/uL (ref 0.2–1.0)
Monocytes Relative: 9 %
Neutro Abs: 1.8 10*3/uL (ref 1.4–6.5)
Neutrophils Relative %: 63 %
Platelets: 107 10*3/uL — ABNORMAL LOW (ref 150–440)
RBC: 2.24 MIL/uL — ABNORMAL LOW (ref 4.40–5.90)
RDW: 15.3 % — ABNORMAL HIGH (ref 11.5–14.5)
WBC: 2.8 10*3/uL — ABNORMAL LOW (ref 3.8–10.6)

## 2015-10-19 ENCOUNTER — Other Ambulatory Visit: Payer: Self-pay | Admitting: *Deleted

## 2015-10-19 MED ORDER — PRAVASTATIN SODIUM 20 MG PO TABS: 20.0000 mg | ORAL_TABLET | Freq: Every day | ORAL | Status: DC

## 2015-10-24 ENCOUNTER — Inpatient Hospital Stay: Payer: Medicare Other | Attending: Hematology and Oncology | Admitting: Hematology and Oncology

## 2015-10-24 ENCOUNTER — Inpatient Hospital Stay: Payer: Medicare Other

## 2015-10-24 VITALS — BP 148/85 | HR 88 | Temp 97.2°F | Wt 219.6 lb

## 2015-10-24 DIAGNOSIS — R0602 Shortness of breath: Secondary | ICD-10-CM | POA: Insufficient documentation

## 2015-10-24 DIAGNOSIS — C9140 Hairy cell leukemia not having achieved remission: Secondary | ICD-10-CM | POA: Diagnosis not present

## 2015-10-24 DIAGNOSIS — Z452 Encounter for adjustment and management of vascular access device: Secondary | ICD-10-CM | POA: Insufficient documentation

## 2015-10-24 DIAGNOSIS — K219 Gastro-esophageal reflux disease without esophagitis: Secondary | ICD-10-CM | POA: Diagnosis not present

## 2015-10-24 DIAGNOSIS — Z882 Allergy status to sulfonamides status: Secondary | ICD-10-CM | POA: Diagnosis not present

## 2015-10-24 DIAGNOSIS — E538 Deficiency of other specified B group vitamins: Secondary | ICD-10-CM

## 2015-10-24 DIAGNOSIS — Z7982 Long term (current) use of aspirin: Secondary | ICD-10-CM | POA: Insufficient documentation

## 2015-10-24 DIAGNOSIS — R630 Anorexia: Secondary | ICD-10-CM | POA: Diagnosis not present

## 2015-10-24 DIAGNOSIS — R5383 Other fatigue: Secondary | ICD-10-CM | POA: Diagnosis not present

## 2015-10-24 DIAGNOSIS — I10 Essential (primary) hypertension: Secondary | ICD-10-CM | POA: Insufficient documentation

## 2015-10-24 DIAGNOSIS — N400 Enlarged prostate without lower urinary tract symptoms: Secondary | ICD-10-CM | POA: Diagnosis not present

## 2015-10-24 DIAGNOSIS — Z7984 Long term (current) use of oral hypoglycemic drugs: Secondary | ICD-10-CM

## 2015-10-24 DIAGNOSIS — Z86010 Personal history of colonic polyps: Secondary | ICD-10-CM | POA: Insufficient documentation

## 2015-10-24 DIAGNOSIS — Z79899 Other long term (current) drug therapy: Secondary | ICD-10-CM | POA: Diagnosis not present

## 2015-10-24 DIAGNOSIS — Z8701 Personal history of pneumonia (recurrent): Secondary | ICD-10-CM | POA: Insufficient documentation

## 2015-10-24 DIAGNOSIS — Z808 Family history of malignant neoplasm of other organs or systems: Secondary | ICD-10-CM | POA: Diagnosis not present

## 2015-10-24 DIAGNOSIS — D61818 Other pancytopenia: Secondary | ICD-10-CM | POA: Diagnosis not present

## 2015-10-24 DIAGNOSIS — Z87891 Personal history of nicotine dependence: Secondary | ICD-10-CM

## 2015-10-24 DIAGNOSIS — Z85828 Personal history of other malignant neoplasm of skin: Secondary | ICD-10-CM

## 2015-10-24 DIAGNOSIS — D539 Nutritional anemia, unspecified: Secondary | ICD-10-CM

## 2015-10-24 DIAGNOSIS — D472 Monoclonal gammopathy: Secondary | ICD-10-CM

## 2015-10-24 DIAGNOSIS — C9141 Hairy cell leukemia, in remission: Secondary | ICD-10-CM

## 2015-10-24 DIAGNOSIS — E119 Type 2 diabetes mellitus without complications: Secondary | ICD-10-CM | POA: Diagnosis not present

## 2015-10-24 LAB — CBC WITH DIFFERENTIAL/PLATELET
BASOS PCT: 1 %
Basophils Absolute: 0 10*3/uL (ref 0–0.1)
EOS ABS: 0.1 10*3/uL (ref 0–0.7)
EOS PCT: 5 %
HCT: 25.4 % — ABNORMAL LOW (ref 40.0–52.0)
HEMOGLOBIN: 8.7 g/dL — AB (ref 13.0–18.0)
Lymphocytes Relative: 17 %
Lymphs Abs: 0.4 10*3/uL — ABNORMAL LOW (ref 1.0–3.6)
MCH: 38.8 pg — ABNORMAL HIGH (ref 26.0–34.0)
MCHC: 34.2 g/dL (ref 32.0–36.0)
MCV: 113.5 fL — ABNORMAL HIGH (ref 80.0–100.0)
Monocytes Absolute: 0.2 10*3/uL (ref 0.2–1.0)
Monocytes Relative: 7 %
NEUTROS PCT: 70 %
Neutro Abs: 1.9 10*3/uL (ref 1.4–6.5)
PLATELETS: 91 10*3/uL — AB (ref 150–440)
RBC: 2.24 MIL/uL — AB (ref 4.40–5.90)
RDW: 15.2 % — ABNORMAL HIGH (ref 11.5–14.5)
WBC: 2.6 10*3/uL — AB (ref 3.8–10.6)

## 2015-10-24 LAB — COMPREHENSIVE METABOLIC PANEL
ALBUMIN: 4.1 g/dL (ref 3.5–5.0)
ALK PHOS: 59 U/L (ref 38–126)
ALT: 19 U/L (ref 17–63)
ANION GAP: 4 — AB (ref 5–15)
AST: 20 U/L (ref 15–41)
BUN: 26 mg/dL — ABNORMAL HIGH (ref 6–20)
CALCIUM: 8.5 mg/dL — AB (ref 8.9–10.3)
CHLORIDE: 105 mmol/L (ref 101–111)
CO2: 25 mmol/L (ref 22–32)
Creatinine, Ser: 1.21 mg/dL (ref 0.61–1.24)
GFR calc non Af Amer: 56 mL/min — ABNORMAL LOW (ref >60)
GLUCOSE: 257 mg/dL — AB (ref 65–99)
Potassium: 4.1 mmol/L (ref 3.5–5.1)
SODIUM: 134 mmol/L — AB (ref 135–145)
Total Bilirubin: 0.7 mg/dL (ref 0.3–1.2)
Total Protein: 7.5 g/dL (ref 6.5–8.1)

## 2015-10-24 NOTE — Progress Notes (Signed)
[facility] day:  10/24/2015  Chief Complaint: Alex Smith is a 81 y.o. male with recurrent hairy cell leukemia who is seen for assessment on day 236 s/p cladribine.  HPI:  The patient was last seen in the medical oncology clinic on 07/25/2015.  At that time, he was day 145 s/p cladribine. Symptomatically, he remained fatigued.  Exam was stable.  Counts had not recovered.  CBC included a hematocrit was 28.1, hemoglobin 9.5, platelets 122,000, and white count 3300 (ANC 2400).  Retic was 3.5%.  Iron saturation was 42%.  B12 was 894.  Folate was 60.5.  TSH was 2.562.  Creatinine was 1.4 (CrCl 47 ml/min).  SPEP revealed a 1.4 gm/dl monoclonal spike.  Kappa free light chains were 46.23, lambda free light chains 12.56 with a ratio of 3.68 (0.26-1.65).   He has had follow-up counts.  CBC on 08/25/2015 revealed a hematocrit of 27.3, hemoglobin 9.2, MCV 113.1, platelets 124,000, WBC 3100 with an ANC of 1800.  CBC on 09/26/2015 revealed a hematocrit of 25.0, hemoglobin 8.6, MCV 113.7, platelets 103,000, WBC 2500 with an ANC of 1700.  Symptomatically, he has felt tired and "not well".  He feels "worn out".  He notes that he is working four 8 hour shifts.  He is taking care of his wife.  Appetite is poor.  He feels full quickly.  He denies any fevers or infections. He denies any bruising or bleeding.  Past Medical History  Diagnosis Date  . Hypertension   . GERD (gastroesophageal reflux disease)   . HLD (hyperlipidemia)   . History of pneumonia 2000's    "once" (07/07/2012)  . Type II diabetes mellitus ([place])   . Hairy cell leukemia ([place]) 2006    recurrent, seizure on rituxan, now on cladribine ([place])  . Basal cell carcinoma of face   . Diverticulosis   . Colon polyps   . BPH (benign prostatic hypertrophy)     followed by urology, discharged (Dr. Bernardo Heater)  . B12 deficiency   . History of shingles   . Shortness of breath dyspnea   . Pneumonia   .  CAP (community acquired pneumonia) 02/15/2015    Past Surgical History  Procedure Laterality Date  . Eye surgery Left 06/2012    laser surgery  . Cataract extraction w/ intraocular lens  implant, bilateral  ~ 2010  . Skin cancer excision      "all over my face" (07/07/2012)  . 25 gauge pars plana vitrectomy with 20 gauge mvr port for macular hole  07/07/2012    Procedure: 25 GAUGE PARS PLANA VITRECTOMY WITH 20 GAUGE MVR PORT FOR MACULAR HOLE;  Surgeon: Hayden Pedro, MD;  Location: [facility];  Service: Ophthalmology;  Laterality: Left;  . Serum patch  07/07/2012    Procedure: SERUM PATCH;  Surgeon: Hayden Pedro, MD;  Location: [facility];  Service: Ophthalmology;  Laterality: Left;  . Gas insertion  07/07/2012    Procedure: INSERTION OF GAS;  Surgeon: Hayden Pedro, MD;  Location: [facility];  Service: Ophthalmology;  Laterality: Left;  [id number]  . Cardiovascular stress test  2013    treadmill - no evidence ischemia, EF 61%  . Colonoscopy  2014    [facility] WNL no rpt needed, h/o polyps  . Peripheral vascular catheterization N/A 02/23/2015    Procedure: Glori Luis Cath Insertion;  Surgeon: Algernon Huxley, MD;  Location: [facility];  Service: Cardiovascular;  Laterality: N/A;  Family History  Problem Relation Age of Onset  . Dementia Mother   . Heart failure Father 69  . Cancer Sister     breast  . Diabetes Paternal Uncle   . Diabetes Paternal Aunt   . CAD Brother 25    MI  . Stroke Neg Hx     Social History:  reports that he quit smoking about 46 years ago. His smoking use included Cigarettes. He has a 50 pack-year smoking history. He has never used smokeless tobacco. He reports that he drinks alcohol. He reports that he does not use illicit drugs.  He works at [employer] 20-32 hours/week.  The patient 's wife has had 3 hip operations.  He takes care of his wife.  He states that they either eat out or bring food in.  He is alone today.  Allergies:  Allergies  Allergen Reactions  .  Rituximab Rash    Chest tightness  . Blood-Group Specific Substance Other (See Comments)    Had a post transfusion reaction of red blood cells; NOW REQUIRES WASHED BLOOD CELLS  . Primaxin [Imipenem] Other (See Comments)    Possible allergy  . Sulfa Antibiotics Itching and Rash  Possible allergy to Zoloft, allopurinol, and a chemotherapy medication.  He tolerates Dapsone.  Current Medications: Current Outpatient Prescriptions  Medication Sig Dispense Refill  . acyclovir (ZOVIRAX) 400 MG tablet TAKE TWO TABLETS TWICE DAILY 120 tablet 3  . aspirin EC 81 MG tablet Take 81 mg by mouth daily.    . dapsone 100 MG tablet TAKE ONE TABLET BY MOUTH EVERY DAY 30 tablet 11  . glucose blood (ACCU-CHEK AVIVA PLUS) test strip Use to check sugar twice daily Dx:E11.9 200 each 3  . HUMULIN R 100 UNIT/ML injection INJCET 10 UNITS SUBCUTANEOUSLY TWICE DAILY BEFORE MEALS 10 mL 11  . hydrochlorothiazide (HYDRODIURIL) 25 MG tablet TAKE ONE TABLET EVERY DAY 30 tablet 11  . insulin NPH Human (HUMULIN N) 100 UNIT/ML injection Inject 0.35 mLs (35 Units total) into the skin 2 (two) times daily before a meal. 20 mL 11  . lidocaine-prilocaine (EMLA) cream Apply 1 application topically as needed. 30 g 3  . lisinopril (PRINIVIL,ZESTRIL) 20 MG tablet TAKE ONE TABLET BY MOUTH TWICE DAILY 60 tablet 6  . metFORMIN (GLUCOPHAGE) 1000 MG tablet TAKE ONE TABLET BY MOUTH TWICE DAILY AS DIRECTED 180 tablet 3  . MICROLET LANCETS MISC USE AS DIRECTED 200 each 3  . omeprazole (PRILOSEC) 20 MG capsule Take 20 mg by mouth daily.     . ondansetron (ZOFRAN) 8 MG tablet Take 1 tablet (8 mg total) by mouth every 8 (eight) hours as needed for nausea or vomiting. 20 tablet 0  . pravastatin (PRAVACHOL) 20 MG tablet Take 1 tablet (20 mg total) by mouth at bedtime. 90 tablet 2  . tamsulosin (FLOMAX) 0.4 MG CAPS capsule Take 0.4 mg by mouth daily.    . vitamin B-12 (CYANOCOBALAMIN) 1000 MCG tablet Take 1,000 mcg by mouth daily.    . vitamin E  400 UNIT capsule Take 200 Units by mouth daily.      No current facility-administered medications for this visit.   Facility-Administered Medications Ordered in Other Visits  Medication Dose Route Frequency Provider Last Rate Last Dose  . heparin lock flush 100 unit/mL  500 Units Intravenous Once Lequita Asal, MD      . sodium chloride 0.9 % injection 10 mL  10 mL Intravenous PRN Lequita Asal, MD   10  mL at 03/03/15 0903  . sodium chloride 0.9 % injection 10 mL  10 mL Intracatheter PRN Lequita Asal, MD   10 mL at 03/10/15 1410   Review of Systems:  GENERAL:  Fatigue.  Worn out.  No fevers or sweats.  Weight up 5 pounds in 3 months. PERFORMANCE STATUS (ECOG):  1 HEENT:  No visual changes, runny nose, sore throat. Pulmonary:  Little cough in AM.  No shortness of breath.  No hemoptysis. Cardiac:  No chest pain, palpitations, orthopnea, or PND. GI:  Poor appetite.  Early satiety.  Constipation (uses Miralax).  No nausea, vomiting, diarrhea, melena or hematochezia. GU:  No urgency, frequency, dysuria, or hematuria. Musculoskeletal:  No back pain.  No joint pain.  No muscle tenderness. Extremities:  No pain or swelling. Skin:  No rashes or skin changes. Neuro:  No headache, numbness or weakness, balance or coordination issues. Endocrine:  Diabetes.  No thyroid issues, hot flashes or night sweats. Psych:  No mood changes, depression or anxiety. Pain:  No focal pain. Review of systems:  All other systems reviewed and found to be negative.   Physical Exam: Blood pressure 148/85, pulse 88, temperature 97.2 F (36.2 C), temperature source Tympanic, weight 219 lb 9.3 oz (99.6 kg), SpO2 90 %.  GENERAL:  Well developed, well nourished, gentleman sitting comfortably in the exam room in no acute distress. MENTAL STATUS:  Alert and oriented to person, place and time. HEAD:  Pearline Cables short hair.  Normocephalic, atraumatic, face symmetric, no Cushingoid features. EYES:  Glasses. Blue  eyes.  Pupils equal round and reactive to light and accomodation.  No conjunctivitis or scleral icterus. ENT:  Oropharynx clear without lesion.  Tongue normal. Mucous membranes moist.  RESPIRATORY:  Clear to auscultation without rales, wheezes or rhonchi. CARDIOVASCULAR:  Regular rate and rhythm without murmur, rub or gallop. ABDOMEN:  Soft, non-tender, with active bowel sounds, and no appreciable hepatosplenomegaly.  No masses. SKIN:  No rashes, ulcers or lesions. EXTREMITIES: No edema, no skin discoloration or tenderness.  No palpable cords. LYMPH NODES: No palpable cervical, supraclavicular, axillary or inguinal adenopathy  NEUROLOGICAL: Unremarkable. PSYCH:  Appropriate.   Appointment on 10/24/2015  Component Date Value Ref Range Status  . WBC 10/24/2015 2.6* 3.8 - 10.6 K/uL Final  . RBC 10/24/2015 2.24* 4.40 - 5.90 MIL/uL Final  . Hemoglobin 10/24/2015 8.7* 13.0 - 18.0 g/dL Final  . HCT 10/24/2015 25.4* 40.0 - 52.0 % Final  . MCV 10/24/2015 113.5* 80.0 - 100.0 fL Final  . MCH 10/24/2015 38.8* 26.0 - 34.0 pg Final  . MCHC 10/24/2015 34.2  32.0 - 36.0 g/dL Final  . RDW 10/24/2015 15.2* 11.5 - 14.5 % Final  . Platelets 10/24/2015 91* 150 - 440 K/uL Final  . Neutrophils Relative % 10/24/2015 70   Final  . Neutro Abs 10/24/2015 1.9  1.4 - 6.5 K/uL Final  . Lymphocytes Relative 10/24/2015 17   Final  . Lymphs Abs 10/24/2015 0.4* 1.0 - 3.6 K/uL Final  . Monocytes Relative 10/24/2015 7   Final  . Monocytes Absolute 10/24/2015 0.2  0.2 - 1.0 K/uL Final  . Eosinophils Relative 10/24/2015 5   Final  . Eosinophils Absolute 10/24/2015 0.1  0 - 0.7 K/uL Final  . Basophils Relative 10/24/2015 1   Final  . Basophils Absolute 10/24/2015 0.0  0 - 0.1 K/uL Final  . Sodium 10/24/2015 134* 135 - 145 mmol/L Final  . Potassium 10/24/2015 4.1  3.5 - 5.1 mmol/L Final  . Chloride  10/24/2015 105  101 - 111 mmol/L Final  . CO2 10/24/2015 25  22 - 32 mmol/L Final  . Glucose, Bld 10/24/2015 257* 65 - 99  mg/dL Final  . BUN 10/24/2015 26* 6 - 20 mg/dL Final  . Creatinine, Ser 10/24/2015 1.21  0.61 - 1.24 mg/dL Final  . Calcium 10/24/2015 8.5* 8.9 - 10.3 mg/dL Final  . Total Protein 10/24/2015 7.5  6.5 - 8.1 g/dL Final  . Albumin 10/24/2015 4.1  3.5 - 5.0 g/dL Final  . AST 10/24/2015 20  15 - 41 U/L Final  . ALT 10/24/2015 19  17 - 63 U/L Final  . Alkaline Phosphatase 10/24/2015 59  38 - 126 U/L Final  . Total Bilirubin 10/24/2015 0.7  0.3 - 1.2 mg/dL Final  . GFR calc non Af Amer 10/24/2015 56* >60 mL/min Final  . GFR calc Af Amer 10/24/2015 >60  >60 mL/min Final   Comment: (NOTE) The eGFR has been calculated using the CKD EPI equation. This calculation has not been validated in all clinical situations. eGFR's persistently <60 mL/min signify possible Chronic Kidney Disease.   Georgiann Hahn gap 10/24/2015 4* 5 - 15 Final    Assessment:  Alex Smith is a 81 y.o. male with recurrent hairy cell leukemia.  He was diagnosed with hairy cell leukemia in 2006.  He received cladribine 11/06-11/05/2005.  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  Bone marrow aspirate and biopsy on 02/07/2015 revealed a extensive marrow involvement by recurrent hairy cell leukemia (approximately 80% of cells in the core). There was a monoclonal plasma cell infiltrate (approximately 10%), compatible with a plasma cell neoplasm. Marrow was hypercellular (40-50%) with markedly diminished residual trilineage hematopoiesis and mild multilineage dyspoiesis.  Peripheral smear revealed leukopenia (WBC 1900) with 2% atypical lymphoid cells compatible with hairy cell leukemia. There was moderate anemia with anisopoikilocytosis. 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  FISH studies for MDS were negative.  Cytogenetics were normal (46, XY).  He has an unclear allergy history.  He may be allergic to Primaxin, Zoloft, allopurinol,  and a chemotherapy medication.  Reaction occurred in 06/2005.  He is allergic to sulfa.  He is not allergic to dapsone.  He requires washed RBCs.  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  He completed a course of Levaquin for pneumonia.  He is on prophylactic acyclovir and dapsone.  Voriconazole was discontinued.  He is currently day 236 status post cladribine (began 03/03/2015).  His counts plateaued and are now decreasing.  Symptomatically, he has chronic fatigue.  Exam is stable.  He denies any fevers or infections.  He denies any bruising or bleeding.  Plan: 1.  Labs today:  CBC with diff, CMP. 2.  Discuss serial CBCs.  Discuss progressive anemia and thrombocytopenia.  Discuss prior bone marrow (90% hairy cell leukemia, 10% plasma cell neoplasm, and multi-lineage dyspoiesis).  Discuss work-up at last visit.  Discuss re-evaluation with bone marrow aspirate and biopsy. 3.  Schedule CT guided bone marrow. 4.  RTC 10 days after bone marrow for MD review and discussion regarding direction of therapy.    Lequita Asal, MD  10/24/2015

## 2015-10-25 ENCOUNTER — Other Ambulatory Visit: Payer: Self-pay | Admitting: Hematology and Oncology

## 2015-10-25 DIAGNOSIS — C9140 Hairy cell leukemia not having achieved remission: Secondary | ICD-10-CM

## 2015-10-25 DIAGNOSIS — D472 Monoclonal gammopathy: Secondary | ICD-10-CM

## 2015-10-27 ENCOUNTER — Encounter: Payer: Self-pay | Admitting: Hematology and Oncology

## 2015-11-02 ENCOUNTER — Other Ambulatory Visit: Payer: Self-pay | Admitting: Radiology

## 2015-11-03 ENCOUNTER — Ambulatory Visit
Admission: RE | Admit: 2015-11-03 | Discharge: 2015-11-03 | Disposition: A | Discharge status: Home / Self Care | Payer: Medicare Other | Source: Ambulatory Visit | Attending: Hematology and Oncology | Admitting: Hematology and Oncology

## 2015-11-03 DIAGNOSIS — D472 Monoclonal gammopathy: Secondary | ICD-10-CM | POA: Diagnosis not present

## 2015-11-03 DIAGNOSIS — D4989 Neoplasm of unspecified behavior of other specified sites: Secondary | ICD-10-CM | POA: Diagnosis not present

## 2015-11-03 DIAGNOSIS — D619 Aplastic anemia, unspecified: Secondary | ICD-10-CM | POA: Diagnosis not present

## 2015-11-03 DIAGNOSIS — Z9889 Other specified postprocedural states: Secondary | ICD-10-CM | POA: Insufficient documentation

## 2015-11-03 DIAGNOSIS — E119 Type 2 diabetes mellitus without complications: Secondary | ICD-10-CM | POA: Insufficient documentation

## 2015-11-03 DIAGNOSIS — C9200 Acute myeloblastic leukemia, not having achieved remission: Secondary | ICD-10-CM | POA: Diagnosis not present

## 2015-11-03 DIAGNOSIS — Z882 Allergy status to sulfonamides status: Secondary | ICD-10-CM | POA: Diagnosis not present

## 2015-11-03 DIAGNOSIS — D696 Thrombocytopenia, unspecified: Secondary | ICD-10-CM | POA: Diagnosis not present

## 2015-11-03 DIAGNOSIS — Z87891 Personal history of nicotine dependence: Secondary | ICD-10-CM | POA: Insufficient documentation

## 2015-11-03 DIAGNOSIS — D649 Anemia, unspecified: Secondary | ICD-10-CM | POA: Insufficient documentation

## 2015-11-03 DIAGNOSIS — C9000 Multiple myeloma not having achieved remission: Secondary | ICD-10-CM | POA: Diagnosis not present

## 2015-11-03 DIAGNOSIS — Z8701 Personal history of pneumonia (recurrent): Secondary | ICD-10-CM | POA: Insufficient documentation

## 2015-11-03 DIAGNOSIS — Z8249 Family history of ischemic heart disease and other diseases of the circulatory system: Secondary | ICD-10-CM | POA: Diagnosis not present

## 2015-11-03 DIAGNOSIS — Z7982 Long term (current) use of aspirin: Secondary | ICD-10-CM | POA: Diagnosis not present

## 2015-11-03 DIAGNOSIS — D46Z Other myelodysplastic syndromes: Secondary | ICD-10-CM | POA: Insufficient documentation

## 2015-11-03 DIAGNOSIS — E785 Hyperlipidemia, unspecified: Secondary | ICD-10-CM | POA: Insufficient documentation

## 2015-11-03 DIAGNOSIS — Z79899 Other long term (current) drug therapy: Secondary | ICD-10-CM | POA: Diagnosis not present

## 2015-11-03 DIAGNOSIS — E538 Deficiency of other specified B group vitamins: Secondary | ICD-10-CM | POA: Insufficient documentation

## 2015-11-03 DIAGNOSIS — Z85828 Personal history of other malignant neoplasm of skin: Secondary | ICD-10-CM | POA: Insufficient documentation

## 2015-11-03 DIAGNOSIS — Z86010 Personal history of colonic polyps: Secondary | ICD-10-CM | POA: Diagnosis not present

## 2015-11-03 DIAGNOSIS — N400 Enlarged prostate without lower urinary tract symptoms: Secondary | ICD-10-CM | POA: Insufficient documentation

## 2015-11-03 DIAGNOSIS — K219 Gastro-esophageal reflux disease without esophagitis: Secondary | ICD-10-CM | POA: Insufficient documentation

## 2015-11-03 DIAGNOSIS — I10 Essential (primary) hypertension: Secondary | ICD-10-CM | POA: Diagnosis not present

## 2015-11-03 DIAGNOSIS — Z794 Long term (current) use of insulin: Secondary | ICD-10-CM | POA: Diagnosis not present

## 2015-11-03 DIAGNOSIS — D61818 Other pancytopenia: Secondary | ICD-10-CM | POA: Diagnosis not present

## 2015-11-03 DIAGNOSIS — D7589 Other specified diseases of blood and blood-forming organs: Secondary | ICD-10-CM | POA: Diagnosis not present

## 2015-11-03 DIAGNOSIS — C9140 Hairy cell leukemia not having achieved remission: Secondary | ICD-10-CM | POA: Diagnosis not present

## 2015-11-03 DIAGNOSIS — Z856 Personal history of leukemia: Secondary | ICD-10-CM | POA: Diagnosis not present

## 2015-11-03 DIAGNOSIS — D469 Myelodysplastic syndrome, unspecified: Secondary | ICD-10-CM | POA: Diagnosis not present

## 2015-11-03 LAB — DIFFERENTIAL
BASOS ABS: 0 10*3/uL (ref 0–0.1)
BLASTS: 0 %
Band Neutrophils: 0 %
Basophils Relative: 0 %
EOS ABS: 0 10*3/uL (ref 0–0.7)
EOS PCT: 2 %
LYMPHS PCT: 21 %
Lymphs Abs: 0.4 10*3/uL — ABNORMAL LOW (ref 1.0–3.6)
MONOS PCT: 9 %
Metamyelocytes Relative: 0 %
Monocytes Absolute: 0.2 10*3/uL (ref 0.2–1.0)
Myelocytes: 0 %
NEUTROS PCT: 68 %
Neutro Abs: 1.4 10*3/uL (ref 1.4–6.5)
OTHER: 0 %
Promyelocytes Absolute: 0 %
nRBC: 0 /100 WBC

## 2015-11-03 LAB — PROTIME-INR
INR: 1.08
Prothrombin Time: 14.2 seconds (ref 11.4–15.0)

## 2015-11-03 LAB — CBC
HEMATOCRIT: 25.3 % — AB (ref 40.0–52.0)
Hemoglobin: 8.6 g/dL — ABNORMAL LOW (ref 13.0–18.0)
MCH: 38 pg — ABNORMAL HIGH (ref 26.0–34.0)
MCHC: 34 g/dL (ref 32.0–36.0)
MCV: 112 fL — AB (ref 80.0–100.0)
PLATELETS: 86 10*3/uL — AB (ref 150–440)
RBC: 2.26 MIL/uL — AB (ref 4.40–5.90)
RDW: 15.8 % — AB (ref 11.5–14.5)
WBC: 2 10*3/uL — AB (ref 3.8–10.6)

## 2015-11-03 LAB — APTT: APTT: 30 s (ref 24–36)

## 2015-11-03 IMAGING — CT CT BIOPSY
1 of 2 series · 14 of 32 positions shown, 19 images · non-contrast
Comparison: none

INDICATION: History of hairy cell leukemia, now with progressive anemia and
thrombocytopenia. Please perform CT-guided bone marrow biopsy for
tissue diagnostic purposes.

[Series 3: osteo bx 2.4 b70s · axial · 0.74mm/px · z∈[+674,+688]mm · 14 of 24 slices shown, 19 images]
[im 2/24  soft-tissue]
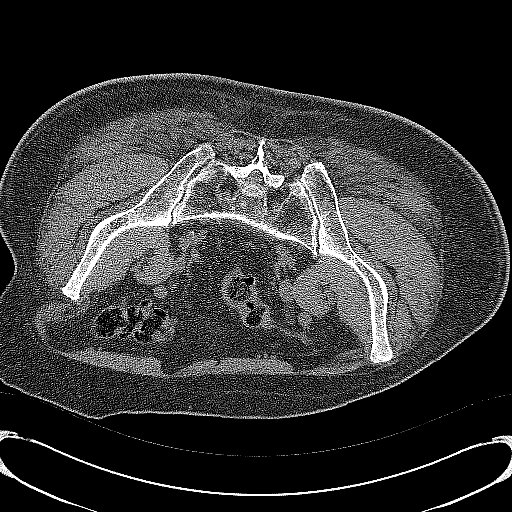
[im 2/24  bone]
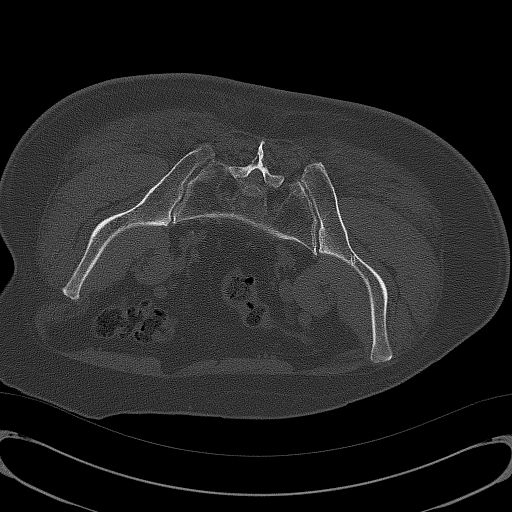
[im 3/24  soft-tissue]
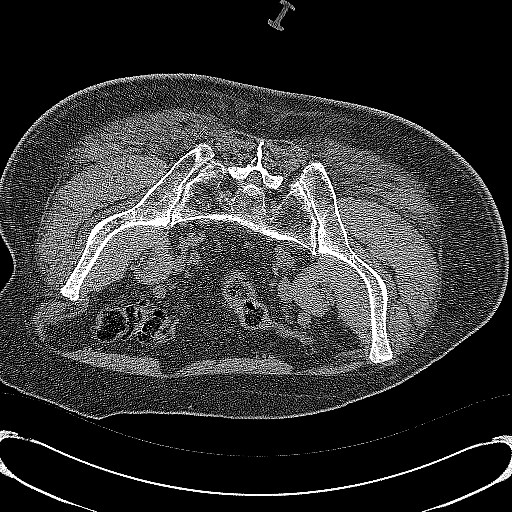
[im 5/24  soft-tissue]
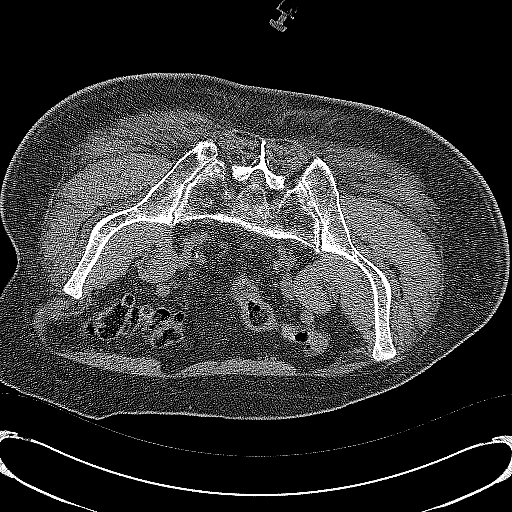
[im 8/24  soft-tissue]
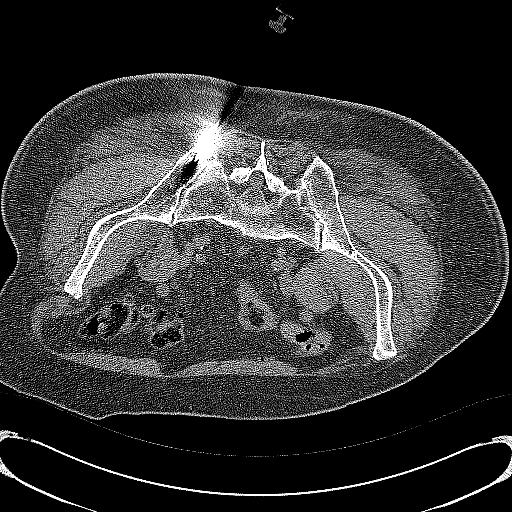
[im 9/24  soft-tissue]
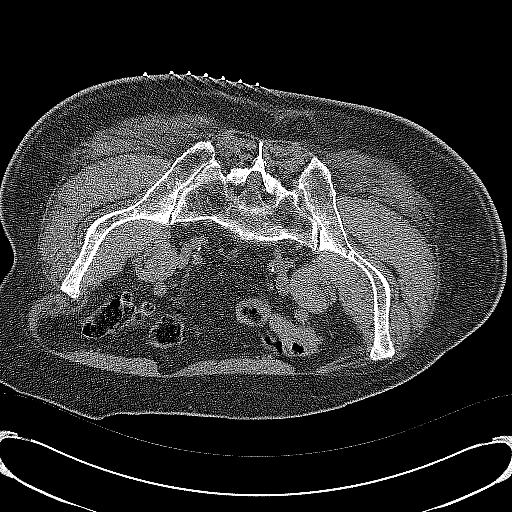
[im 11/24  soft-tissue]
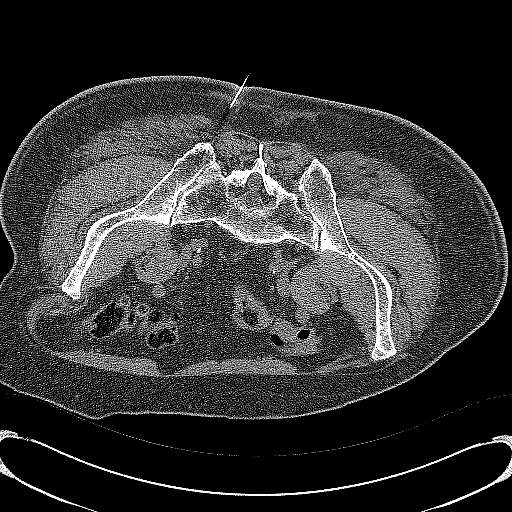
[im 12/24  soft-tissue]
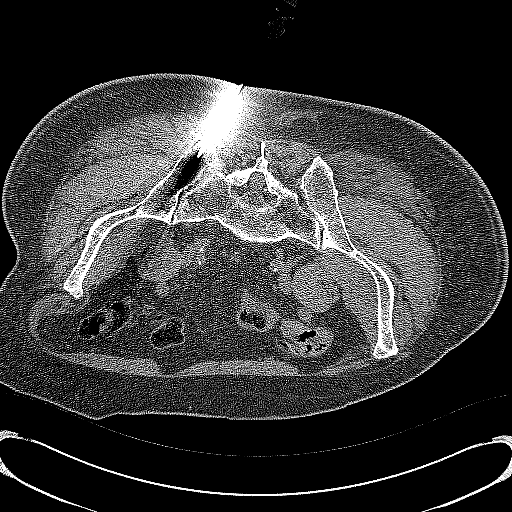
[im 13/24  soft-tissue]
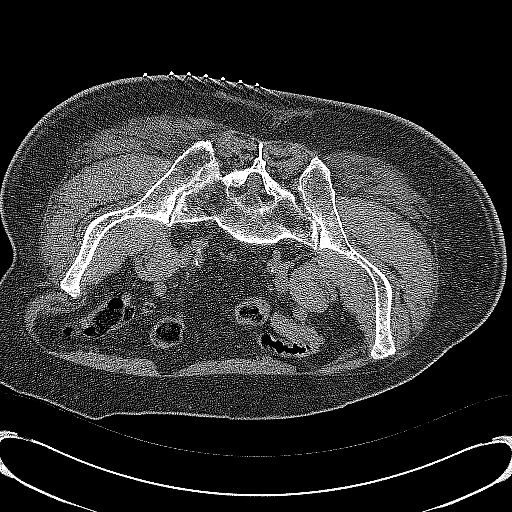
[im 15/24  soft-tissue]
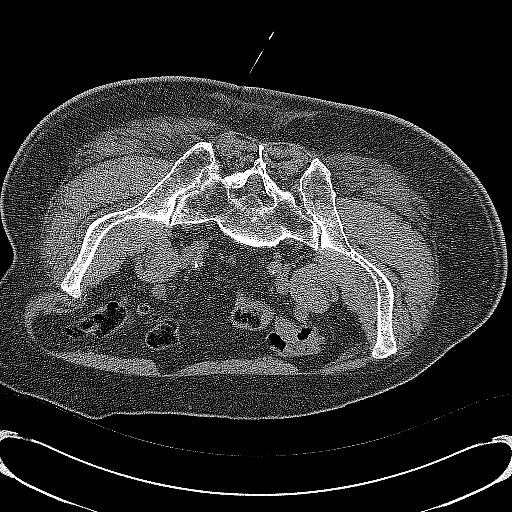
[im 15/24  bone]
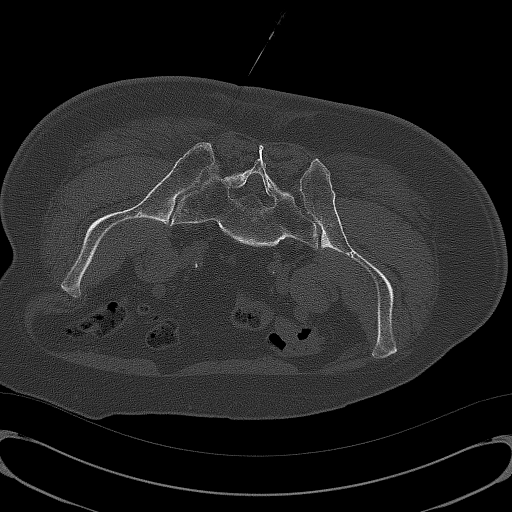
[im 16/24  soft-tissue]
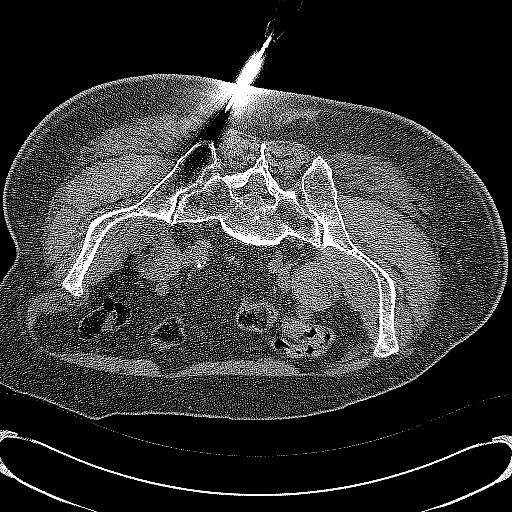
[im 18/24  lung]
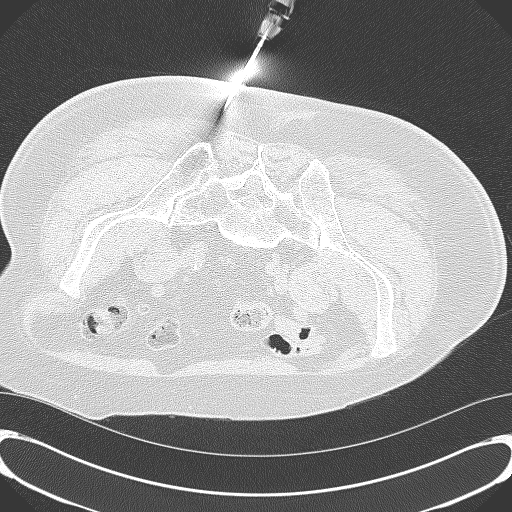
[im 19/24  soft-tissue]
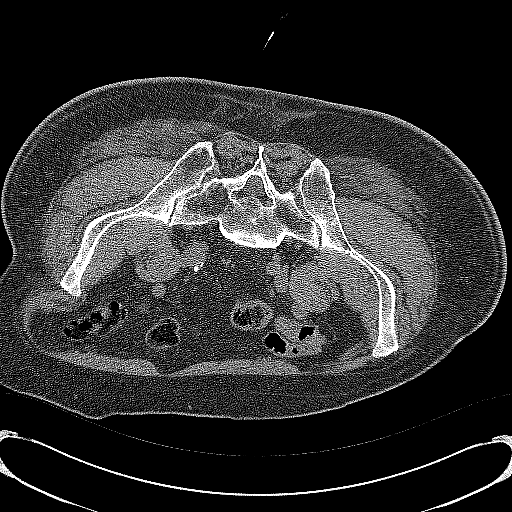
[im 19/24  lung]
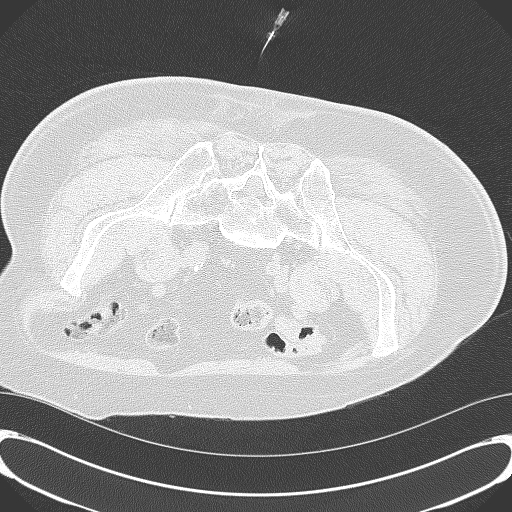
[im 21/24  soft-tissue]
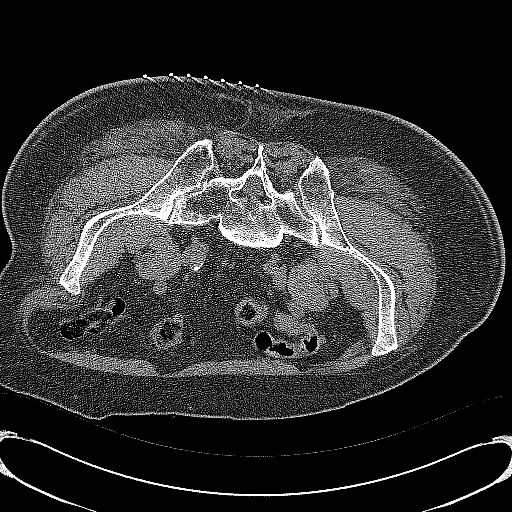
[im 21/24  lung]
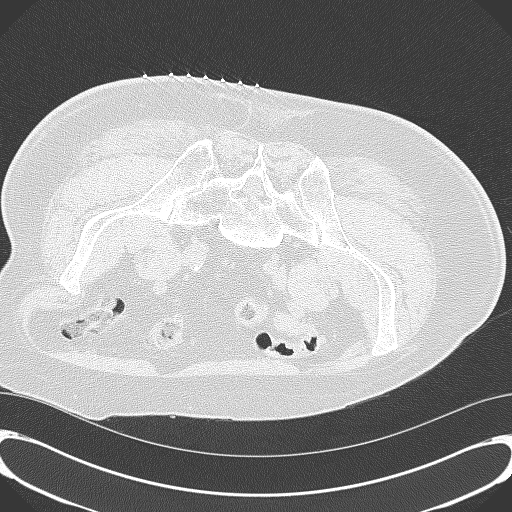
[im 22/24  soft-tissue]
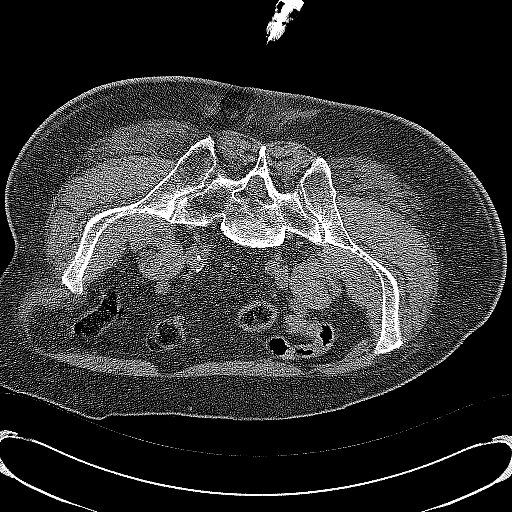
[im 22/24  lung]
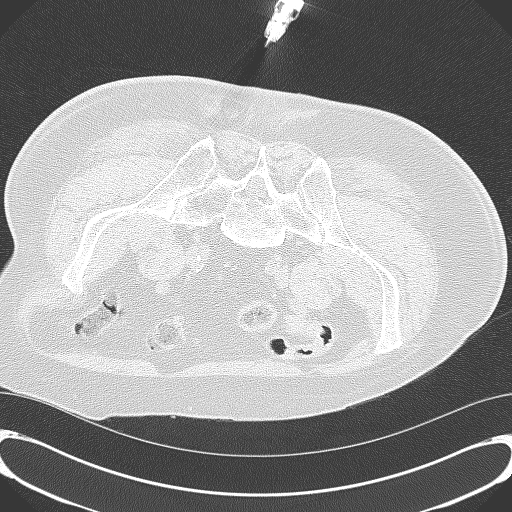

[14 of 32 positions shown; findings below may reference images not displayed]

EXAM:
CT-GUIDED BONE MARROW BIOPSY AND ASPIRATION

MEDICATIONS:
None

ANESTHESIA/SEDATION:
None

COMPLICATIONS:
None immediate.

PROCEDURE:
Informed consent was obtained from the patient following an
explanation of the procedure, risks, benefits and alternatives. The
patient understands, agrees and consents for the procedure. All
questions were addressed. A time out was performed prior to the
initiation of the procedure. The patient was positioned prone and
non-contrast localization CT was performed of the pelvis to
demonstrate the iliac marrow spaces. The operative site was prepped
and draped in the usual sterile fashion.

Under sterile conditions and local anesthesia, a 22 gauge spinal
needle was utilized for procedural planning. Next, an 11 gauge
coaxial bone biopsy needle was advanced into the left iliac marrow
space. Needle position was confirmed with CT imaging. Initially,
bone marrow aspiration was performed. Next, a bone marrow biopsy was
obtained with the 11 gauge outer bone marrow device. The 11 gauge
coaxial bone biopsy needle was re-advanced into a slightly different
location within the left iliac marrow space, positioning was
confirmed and an additional bone marrow biopsy was obtained. Samples
were prepared with the cytotechnologist and deemed adequate. The
needle was removed intact. Hemostasis was obtained with compression
and a dressing was placed. The patient tolerated the procedure well
without immediate post procedural complication.
IMPRESSION: Successful CT guided left iliac bone marrow aspiration and core
biopsy.

## 2015-11-03 MED ORDER — SODIUM CHLORIDE 0.9 % IV SOLN
Freq: Once | INTRAVENOUS | Status: AC
  Administered 2015-11-03: 11:00:00 via INTRAVENOUS

## 2015-11-03 NOTE — Discharge Instructions (Signed)
° °  Bone Marrow Aspiration and Bone Marrow Biopsy, Care After °Refer to this sheet in the next few weeks. These instructions provide you with information about caring for yourself after your procedure. Your health care provider may also give you more specific instructions. Your treatment has been planned according to current medical practices, but problems sometimes occur. Call your health care provider if you have any problems or questions after your procedure. °WHAT TO EXPECT AFTER THE PROCEDURE °After your procedure, it is common to have: °· Soreness or tenderness around the puncture site. °· Bruising. °HOME CARE INSTRUCTIONS °· Take medicines only as directed by your health care provider. °· Follow your health care provider's instructions about: °¨ Puncture site care. °¨ Bandage (dressing) changes and removal. °· Bathe and shower as directed by your health care provider. °· Check your puncture site every day for signs of infection. Watch for: °¨ Redness, swelling, or pain. °¨ Fluid, blood, or pus. °· Return to your normal activities as directed by your health care provider. °· Keep all follow-up visits as directed by your health care provider. This is important. °SEEK MEDICAL CARE IF: °· You have a fever. °· You have uncontrollable bleeding. °· You have redness, swelling, or pain at the site of your puncture. °· You have fluid, blood, or pus coming from your puncture site. °  °This information is not intended to replace advice given to you by your health care provider. Make sure you discuss any questions you have with your health care provider. °  °Document Released: 02/22/2005 Document Revised: 12/20/2014 Document Reviewed: 07/27/2014 °[employer]vier Interactive Patient Education ©2016 [employer] ° °

## 2015-11-03 NOTE — Consult Note (Signed)
Chief Complaint: History of leukemia, presents today for CT-guided bone marrow biopsy  Referring Physician(s): Corcoran,Melissa C  History of Present Illness: Alex Smith is a 81 y.o. male with past medical history significant for hypertension, hyperlipidemia, diabetes and leukemia who presents today for CT-guided bone marrow biopsy and aspiration. The patient is unaccompanied and serves as his own historian.  Given history of progressive anemia and thrombocytopenia, request is made for CT-guided bone marrow biopsy and aspiration to evaluate for disease progression.  He underwent a successful CT-guided bone marrow biopsy and aspiration on 02/07/2015, so he is familiar with the procedure.    The patient continues complain of baseline fatigue. He is otherwise is without complaint. No fever or chills. No chest pain short of breath.  Past Medical History  Diagnosis Date  . Hypertension   . GERD (gastroesophageal reflux disease)   . HLD (hyperlipidemia)   . History of pneumonia 2000's    "once" (07/07/2012)  . Type II diabetes mellitus ([place])   . 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 Basal cell carcinoma of face   . Diverticulosis   . Colon polyps   . BPH (benign prostatic hypertrophy)     followed by urology, discharged (Dr. Bernardo Heater)  . B12 deficiency   . History of shingles   . Shortness of breath dyspnea   . Pneumonia   . CAP (community acquired pneumonia) 02/15/2015    Past Surgical History  Procedure Laterality Date  . Eye surgery Left 06/2012    laser surgery  . Cataract extraction w/ intraocular lens  implant, bilateral  ~ 2010  . Skin cancer excision      "all over my face" (07/07/2012)  . 25 gauge pars plana vitrectomy with 20 gauge mvr port for macular hole  07/07/2012    Procedure: 25 GAUGE PARS PLANA VITRECTOMY WITH 20 GAUGE MVR PORT FOR MACULAR HOLE;  Surgeon: Hayden Pedro, MD;  Location: [facility];   Service: Ophthalmology;  Laterality: Left;  . Serum patch  07/07/2012    Procedure: SERUM PATCH;  Surgeon: Hayden Pedro, MD;  Location: [facility];  Service: Ophthalmology;  Laterality: Left;  . Gas insertion  07/07/2012    Procedure: INSERTION OF GAS;  Surgeon: Hayden Pedro, MD;  Location: [facility];  Service: Ophthalmology;  Laterality: Left;  C3F8  . Cardiovascular stress test  2013    treadmill - no evidence ischemia, EF 61%  . Colonoscopy  2014    [facility] WNL no rpt needed, h/o polyps  . Peripheral vascular catheterization N/A 02/23/2015    Procedure: Glori Luis Cath Insertion;  Surgeon: Algernon Huxley, MD;  Location: [facility];  Service: Cardiovascular;  Laterality: N/A;    Allergies: Rituximab; Blood-group specific substance; Primaxin; and Sulfa antibiotics  Medications: Prior to Admission medications   Medication Sig Start Date End Date Taking? Authorizing Provider  acyclovir (ZOVIRAX) 400 MG tablet TAKE TWO TABLETS TWICE DAILY 10/02/15  Yes Ria Bush, MD  aspirin EC 81 MG tablet Take 81 mg by mouth daily.   Yes Historical Provider, MD  dapsone 100 MG tablet TAKE ONE TABLET BY MOUTH EVERY DAY 08/07/15  Yes Ria Bush, MD  glucose blood (ACCU-CHEK AVIVA PLUS) test strip Use to check sugar twice daily Dx:E11.9 09/26/14  Yes Ria Bush, MD  HUMULIN R 100 UNIT/ML injection INJCET [place] 06/27/15   Ria Bush,  MD  hydrochlorothiazide (HYDRODIURIL) 25 MG tablet TAKE ONE TABLET EVERY DAY 07/03/15   Ria Bush, MD  insulin NPH Human (HUMULIN N) 100 UNIT/ML injection Inject 0.35 mLs (35 Units total) into the skin 2 (two) times daily before a meal. 10/12/14   Ria Bush, MD  lidocaine-prilocaine (EMLA) cream Apply 1 application topically as needed. 02/28/15   Lequita Asal, MD  lisinopril (PRINIVIL,ZESTRIL) 20 MG tablet TAKE ONE TABLET BY MOUTH TWICE DAILY 09/04/15   Ria Bush, MD  metFORMIN (GLUCOPHAGE) 1000  MG tablet TAKE ONE TABLET BY MOUTH TWICE DAILY AS DIRECTED 02/06/15   Ria Bush, MD  MICROLET LANCETS MISC USE AS DIRECTED 04/17/15   Ria Bush, MD  omeprazole (PRILOSEC) 20 MG capsule Take 20 mg by mouth daily.     Historical Provider, MD  ondansetron (ZOFRAN) 8 MG tablet Take 1 tablet (8 mg total) by mouth every 8 (eight) hours as needed for nausea or vomiting. 03/03/15   Lequita Asal, MD  pravastatin (PRAVACHOL) 20 MG tablet Take 1 tablet (20 mg total) by mouth at bedtime. 10/19/15   Ria Bush, MD  tamsulosin (FLOMAX) 0.4 MG CAPS capsule Take 0.4 mg by mouth daily.    Historical Provider, MD  vitamin B-12 (CYANOCOBALAMIN) 1000 MCG tablet Take 1,000 mcg by mouth daily.    Historical Provider, MD  vitamin E 400 UNIT capsule Take 200 Units by mouth daily.     Historical Provider, MD     Family History  Problem Relation Age of Onset  . Dementia Mother   . Heart failure Father 79  . Cancer Sister     breast  . Diabetes Paternal Uncle   . Diabetes Paternal Aunt   . CAD Brother 84    MI  . Stroke Neg Hx     Social History   Social History  . Marital Status: Married    Spouse Name: N/A  . Number of Children: N/A  . Years of Education: N/A   Social History Main Topics  . Smoking status: Former Smoker -- 2.00 packs/day for 25 years    Types: Cigarettes    Quit date: 02/06/1969  . Smokeless tobacco: Never Used     Comment: 07/07/2012 "stopped smoking ~ 40 yr ago; smoked 20-44yr  . Alcohol Use: 0.0 oz/week    0 Standard drinks or equivalent per week     Comment: rare  . Drug Use: No  . Sexual Activity: No   Other Topics Concern  . Not on file   Social History Narrative   Married >50 yrs, cares for wife. 1 cat   Occupation: was cArt gallery manager still works for [employer]   Activity: likes to golf   Diet: moderate water, fruits/vegetables daily    ECOG Status: 0 - Asymptomatic  Review of Systems: A 12 point ROS discussed and pertinent positives are  indicated in the HPI above.  All other systems are negative.  Review of Systems  Constitutional: Positive for fatigue. Negative for fever.  Respiratory: Negative.   Cardiovascular: Negative.     Vital Signs: BP 146/63 mmHg  Pulse 70  Resp 22  Ht _0  (1.702 m)  Wt 219 lb (99.338 kg)  BMI 34.29 kg/m2  SpO2 94%  Physical Exam  Constitutional: He appears well-developed and well-nourished.  HENT:  Head: Normocephalic and atraumatic.  Cardiovascular: Normal rate and regular rhythm.   Pulmonary/Chest: Effort normal and breath sounds normal.  Nursing note and vitals reviewed.   Imaging:  CT-guided  bone marrow biopsy and aspiration - 01/18/2015  Labs:  CBC:  Recent Labs  08/25/15 1427 09/26/15 1354 10/10/15 1432 10/24/15 1454  WBC 3.1* 2.5* 2.8* 2.6*  HGB 9.2* 8.6* 8.7* 8.7*  HCT 27.3* 25.0* 25.5* 25.4*  PLT 124* 103* 107* 91*    COAGS:  Recent Labs  02/07/15 0722 02/21/15 1432  INR 0.96 0.90    BMP:  Recent Labs  06/26/15 1440 07/25/15 1512 09/26/15 1354 10/24/15 1454  NA 140 131* 133* 134*  K 4.0 4.0 4.1 4.1  CL 107 101 104 105  CO2 _0 GLUCOSE 215* 237* 258* 257*  BUN 24* 34* 29* 26*  CALCIUM 8.9 8.9 8.7* 8.5*  CREATININE 1.21 1.40* 1.13 1.21  GFRNONAA 56* 47* >60 56*  GFRAA >60 54* >60 >60    LIVER FUNCTION TESTS:  Recent Labs  06/26/15 1440 07/25/15 1512 09/26/15 1354 10/24/15 1454  BILITOT 0.2* 0.8 0.8 0.7  AST _1 ALT 18 16* 18 19  ALKPHOS 71 75 69 59  PROT 7.5 8.0 7.1 7.5  ALBUMIN 3.9 4.3 4.1 4.1   Assessment and Plan:  Alex Smith is a 81 y.o. male with past medical history significant for hypertension, hyperlipidemia, diabetes and leukemia who presents today for CT-guided bone marrow biopsy and aspiration. The patient is unaccompanied and serves as his own historian.  Given history of progressive anemia and thrombocytopenia, request is made for CT-guided bone marrow biopsy and aspiration to evaluate  for disease progression.  Risks and Benefits discussed with the patient including, but not limited to bleeding, infection, damage to adjacent structures or low yield requiring additional tests.  All of the patient's questions were answered, patient is agreeable to proceed.  Consent signed and in chart.  He underwent a successful CT-guided bone marrow biopsy and aspiration on 02/07/2015, so he is familiar with the procedure.  The patient wishes to have the procedure performed without moderate sedation.  A copy of this report was sent to the requesting provider on this date.  Electronically Signed: Sandi Mariscal 11/03/2015, 11:20 AM   I spent a total of 15 Minutes in face to face in clinical consultation, greater than 50% of which was counseling/coordinating care for CT-guided bone marrow biopsy and aspiration.

## 2015-11-03 NOTE — Procedures (Signed)
Technically successful CT guided bone marrow aspiration and biopsy of left iliac crest. EBL: None No immediate complications.    SignedSandi Mariscal [phone] 11/03/2015, 12:28 PM

## 2015-11-03 NOTE — Sedation Documentation (Signed)
Pt reported he would rather drive self home, Dr Pascal Lux notified, pt aware that he will only get local anesthetic.

## 2015-11-17 ENCOUNTER — Inpatient Hospital Stay (HOSPITAL_BASED_OUTPATIENT_CLINIC_OR_DEPARTMENT_OTHER): Payer: Medicare Other | Admitting: Hematology and Oncology

## 2015-11-17 ENCOUNTER — Other Ambulatory Visit: Payer: Self-pay

## 2015-11-17 ENCOUNTER — Other Ambulatory Visit: Payer: Self-pay | Admitting: Hematology and Oncology

## 2015-11-17 ENCOUNTER — Inpatient Hospital Stay: Payer: Medicare Other

## 2015-11-17 VITALS — BP 143/75 | HR 18 | Temp 97.7°F | Ht 67.0 in | Wt 218.4 lb

## 2015-11-17 DIAGNOSIS — Z808 Family history of malignant neoplasm of other organs or systems: Secondary | ICD-10-CM

## 2015-11-17 DIAGNOSIS — D539 Nutritional anemia, unspecified: Secondary | ICD-10-CM

## 2015-11-17 DIAGNOSIS — I10 Essential (primary) hypertension: Secondary | ICD-10-CM

## 2015-11-17 DIAGNOSIS — R5383 Other fatigue: Secondary | ICD-10-CM

## 2015-11-17 DIAGNOSIS — N400 Enlarged prostate without lower urinary tract symptoms: Secondary | ICD-10-CM | POA: Diagnosis not present

## 2015-11-17 DIAGNOSIS — D61818 Other pancytopenia: Secondary | ICD-10-CM

## 2015-11-17 DIAGNOSIS — D472 Monoclonal gammopathy: Secondary | ICD-10-CM

## 2015-11-17 DIAGNOSIS — Z8701 Personal history of pneumonia (recurrent): Secondary | ICD-10-CM

## 2015-11-17 DIAGNOSIS — Z452 Encounter for adjustment and management of vascular access device: Secondary | ICD-10-CM | POA: Diagnosis not present

## 2015-11-17 DIAGNOSIS — Z882 Allergy status to sulfonamides status: Secondary | ICD-10-CM | POA: Diagnosis not present

## 2015-11-17 DIAGNOSIS — Z7984 Long term (current) use of oral hypoglycemic drugs: Secondary | ICD-10-CM

## 2015-11-17 DIAGNOSIS — E538 Deficiency of other specified B group vitamins: Secondary | ICD-10-CM

## 2015-11-17 DIAGNOSIS — E119 Type 2 diabetes mellitus without complications: Secondary | ICD-10-CM

## 2015-11-17 DIAGNOSIS — R0602 Shortness of breath: Secondary | ICD-10-CM | POA: Diagnosis not present

## 2015-11-17 DIAGNOSIS — Z79899 Other long term (current) drug therapy: Secondary | ICD-10-CM

## 2015-11-17 DIAGNOSIS — Z87891 Personal history of nicotine dependence: Secondary | ICD-10-CM

## 2015-11-17 DIAGNOSIS — C9140 Hairy cell leukemia not having achieved remission: Secondary | ICD-10-CM

## 2015-11-17 DIAGNOSIS — K219 Gastro-esophageal reflux disease without esophagitis: Secondary | ICD-10-CM | POA: Diagnosis not present

## 2015-11-17 DIAGNOSIS — Z85828 Personal history of other malignant neoplasm of skin: Secondary | ICD-10-CM | POA: Diagnosis not present

## 2015-11-17 DIAGNOSIS — C801 Malignant (primary) neoplasm, unspecified: Secondary | ICD-10-CM

## 2015-11-17 DIAGNOSIS — Z86010 Personal history of colonic polyps: Secondary | ICD-10-CM

## 2015-11-17 DIAGNOSIS — Z7982 Long term (current) use of aspirin: Secondary | ICD-10-CM

## 2015-11-17 DIAGNOSIS — R630 Anorexia: Secondary | ICD-10-CM

## 2015-11-17 LAB — CBC WITH DIFFERENTIAL/PLATELET
Basophils Absolute: 0 10*3/uL (ref 0–0.1)
Basophils Relative: 1 %
Eosinophils Absolute: 0.1 10*3/uL (ref 0–0.7)
Eosinophils Relative: 5 %
HCT: 24.9 % — ABNORMAL LOW (ref 40.0–52.0)
Hemoglobin: 8.6 g/dL — ABNORMAL LOW (ref 13.0–18.0)
Lymphocytes Relative: 26 %
Lymphs Abs: 0.6 10*3/uL — ABNORMAL LOW (ref 1.0–3.6)
MCH: 39 pg — ABNORMAL HIGH (ref 26.0–34.0)
MCHC: 34.5 g/dL (ref 32.0–36.0)
MCV: 113.1 fL — ABNORMAL HIGH (ref 80.0–100.0)
Monocytes Absolute: 0.2 10*3/uL (ref 0.2–1.0)
Monocytes Relative: 9 %
Neutro Abs: 1.4 10*3/uL (ref 1.4–6.5)
Neutrophils Relative %: 59 %
Platelets: 94 10*3/uL — ABNORMAL LOW (ref 150–440)
RBC: 2.2 MIL/uL — ABNORMAL LOW (ref 4.40–5.90)
RDW: 15.4 % — ABNORMAL HIGH (ref 11.5–14.5)
WBC: 2.4 10*3/uL — ABNORMAL LOW (ref 3.8–10.6)

## 2015-11-17 LAB — COMPREHENSIVE METABOLIC PANEL
ALT: 20 U/L (ref 17–63)
AST: 25 U/L (ref 15–41)
Albumin: 4.2 g/dL (ref 3.5–5.0)
Alkaline Phosphatase: 57 U/L (ref 38–126)
Anion gap: 4 — ABNORMAL LOW (ref 5–15)
BUN: 26 mg/dL — ABNORMAL HIGH (ref 6–20)
CO2: 24 mmol/L (ref 22–32)
Calcium: 9.1 mg/dL (ref 8.9–10.3)
Chloride: 108 mmol/L (ref 101–111)
Creatinine, Ser: 1.11 mg/dL (ref 0.61–1.24)
GFR calc Af Amer: >60 mL/min (ref >60)
GFR calc non Af Amer: >60 mL/min (ref >60)
Glucose, Bld: 166 mg/dL — ABNORMAL HIGH (ref 65–99)
Potassium: 4.2 mmol/L (ref 3.5–5.1)
Sodium: 136 mmol/L (ref 135–145)
Total Bilirubin: 0.6 mg/dL (ref 0.3–1.2)
Total Protein: 7.2 g/dL (ref 6.5–8.1)

## 2015-11-17 MED ORDER — SODIUM CHLORIDE 0.9% FLUSH
10.0000 mL | Freq: Once | INTRAVENOUS | Status: AC
Start: 2015-11-17 — End: 2015-11-17
  Administered 2015-11-17: 10 mL via INTRAVENOUS
  Filled 2015-11-17: qty 10

## 2015-11-17 MED ORDER — HEPARIN SOD (PORK) LOCK FLUSH 100 UNIT/ML IV SOLN
INTRAVENOUS | Status: AC
  Filled 2015-11-17: qty 5

## 2015-11-17 MED ORDER — HEPARIN SOD (PORK) LOCK FLUSH 100 UNIT/ML IV SOLN
500.0000 [IU] | Freq: Once | INTRAVENOUS | Status: AC
  Administered 2015-11-17: 500 [IU] via INTRAVENOUS

## 2015-11-17 NOTE — Progress Notes (Signed)
[facility] day:  11/17/2015  Chief Complaint: Alex Smith is a 81 y.o. male with recurrent hairy cell leukemia who is seen for review of interval bone marrow and discussion regarding direction of therapy.  HPI:  The patient was last seen in the medical oncology clinic on 10/24/2015.  At that time, he was day 236 s/p cladribine. Symptomatically, he was fatigued.  He described working four 8 hour shifts and taking care of his wife.  Appetite was poor.  He denied any fevers or infections.  He denies any bruising or bleeding.  CBC revealed a hematocrit of 25.4, hemoglobin 8.7, platelets 91,000, WBC 2600 with an ANC of 1900.  At last visit, we discussed his progressive anemia and thrombocytopenia and prior bone marrow (90% hairy cell leukemia, 10% plasma cell neoplasm, and multi-lineage dyspoiesis).  Decision was made to repeat his bone marrow aspirate and biopsy.  Bone marrow on 11/03/2015 revealed persistent plasma cell neoplasm, now with mildy atypical monoclonal plasma cells estimated at 20-30%.   There was no morphologic evidence of residual hairy cell leukemia.  There was a minute population (0.03%) with hairy cell immunophenotype detected by flow.  Marrow was normocellular for age with relative erythroid hyperplasia, relative myeloid hypoplasia, mild dyspoiesis, adequate megakaryocytes and no increased blasts.  There was no significant increase in marrow reticulin fibers.  Iron was present.  FISH studies for MDS were negative.  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  Cytogenetics were normal (46, XY).  Symptomatically, he remains fatigued.  He notes shortness of breath with exertion.  He denies any bone pain.  He denies any fevers or infections.   Past Medical History  Diagnosis Date  . Hypertension   . GERD (gastroesophageal reflux disease)   . HLD (hyperlipidemia)   . History of pneumonia 2000's     "once" (07/07/2012)  . Type II diabetes mellitus ([place])   . 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 Basal cell carcinoma of face   . Diverticulosis   . Colon polyps   . BPH (benign prostatic hypertrophy)     followed by urology, discharged (Dr. Bernardo Heater)  . B12 deficiency   . History of shingles   . Shortness of breath dyspnea   . Pneumonia   . CAP (community acquired pneumonia) 02/15/2015    Past Surgical History  Procedure Laterality Date  . Eye surgery Left 06/2012    laser surgery  . Cataract extraction w/ intraocular lens  implant, bilateral  ~ 2010  . Skin cancer excision      "all over my face" (07/07/2012)  . 25 gauge pars plana vitrectomy with 20 gauge mvr port for macular hole  07/07/2012    Procedure: 25 GAUGE PARS PLANA VITRECTOMY WITH 20 GAUGE MVR PORT FOR MACULAR HOLE;  Surgeon: Hayden Pedro, MD;  Location: [facility];  Service: Ophthalmology;  Laterality: Left;  . Serum patch  07/07/2012    Procedure: SERUM PATCH;  Surgeon: Hayden Pedro, MD;  Location: [facility];  Service: Ophthalmology;  Laterality: Left;  . Gas insertion  07/07/2012    Procedure: INSERTION OF GAS;  Surgeon: Hayden Pedro, MD;  Location: [facility];  Service: Ophthalmology;  Laterality: Left;  C3F8  . Cardiovascular stress test  2013    treadmill - no evidence ischemia, EF 61%  . Colonoscopy  2014  [facility] WNL no rpt needed, h/o polyps  . Peripheral vascular catheterization N/A 02/23/2015    Procedure: Glori Luis Cath Insertion;  Surgeon: Algernon Huxley, MD;  Location: [facility];  Service: Cardiovascular;  Laterality: N/A;  . Bone marrow biopsy  2016    Family History  Problem Relation Age of Onset  . Dementia Mother   . Heart failure Father 67  . Cancer Sister     breast  . Diabetes Paternal Uncle   . Diabetes Paternal Aunt   . CAD Brother 57    MI  . Stroke Neg Hx     Social History:  reports that he quit smoking about 46 years  ago. His smoking use included Cigarettes. He has a 50 pack-year smoking history. He has never used smokeless tobacco. He reports that he drinks alcohol. He reports that he does not use illicit drugs.  He works at [employer] 20-32 hours/week.  The patient 's wife has had 3 hip operations.  He takes care of his wife.  He states that they either eat out or bring food in.  He is accompanied by his oldest son today.  Allergies:  Allergies  Allergen Reactions  . Rituximab Rash    Chest tightness  . Blood-Group Specific Substance Other (See Comments)    Had a post transfusion reaction of red blood cells; NOW REQUIRES WASHED BLOOD CELLS  . Primaxin [Imipenem] Other (See Comments)    Possible allergy  . Sulfa Antibiotics Itching and Rash  Possible allergy to Zoloft, allopurinol, and a chemotherapy medication.  He tolerates Dapsone.  Current Medications: Current Outpatient Prescriptions  Medication Sig Dispense Refill  . acyclovir (ZOVIRAX) 400 MG tablet TAKE TWO TABLETS TWICE DAILY 120 tablet 3  . aspirin EC 81 MG tablet Take 81 mg by mouth daily.    . dapsone 100 MG tablet TAKE ONE TABLET BY MOUTH EVERY DAY 30 tablet 11  . glucose blood (ACCU-CHEK AVIVA PLUS) test strip Use to check sugar twice daily Dx:E11.9 200 each 3  . HUMULIN R 100 UNIT/ML injection INJCET 10 UNITS SUBCUTANEOUSLY TWICE DAILY BEFORE MEALS 10 mL 11  . hydrochlorothiazide (HYDRODIURIL) 25 MG tablet TAKE ONE TABLET EVERY DAY 30 tablet 11  . insulin NPH Human (HUMULIN N) 100 UNIT/ML injection Inject 0.35 mLs (35 Units total) into the skin 2 (two) times daily before a meal. 20 mL 11  . lidocaine-prilocaine (EMLA) cream Apply 1 application topically as needed. 30 g 3  . lisinopril (PRINIVIL,ZESTRIL) 20 MG tablet TAKE ONE TABLET BY MOUTH TWICE DAILY 60 tablet 6  . metFORMIN (GLUCOPHAGE) 1000 MG tablet TAKE ONE TABLET BY MOUTH TWICE DAILY AS DIRECTED 180 tablet 3  . MICROLET LANCETS MISC USE AS DIRECTED 200 each 3  . omeprazole  (PRILOSEC) 20 MG capsule Take 20 mg by mouth daily.     . ondansetron (ZOFRAN) 8 MG tablet Take 1 tablet (8 mg total) by mouth every 8 (eight) hours as needed for nausea or vomiting. 20 tablet 0  . pravastatin (PRAVACHOL) 20 MG tablet Take 1 tablet (20 mg total) by mouth at bedtime. 90 tablet 2  . tamsulosin (FLOMAX) 0.4 MG CAPS capsule Take 0.4 mg by mouth daily.    . vitamin B-12 (CYANOCOBALAMIN) 1000 MCG tablet Take 1,000 mcg by mouth daily.    . vitamin E 400 UNIT capsule Take 200 Units by mouth daily.      No current facility-administered medications for this visit.   Facility-Administered Medications Ordered in  Other Visits  Medication Dose Route Frequency Provider Last Rate Last Dose  . heparin lock flush 100 unit/mL  500 Units Intravenous Once Lequita Asal, MD      . sodium chloride 0.9 % injection 10 mL  10 mL Intravenous PRN Lequita Asal, MD   10 mL at 03/03/15 0903  . sodium chloride 0.9 % injection 10 mL  10 mL Intracatheter PRN Lequita Asal, MD   10 mL at 03/10/15 1410   Review of Systems:  GENERAL:  Fatigue. No fevers or sweats.  Weight stable. PERFORMANCE STATUS (ECOG):  1 HEENT:  No visual changes, runny nose, sore throat. Pulmonary:  Shortness of breath with exertion.  No hemoptysis. Cardiac:  No chest pain, palpitations, orthopnea, or PND. GI:  Appetite 75%.  No nausea, vomiting, diarrhea, constipation, melena or hematochezia. GU:  No urgency, frequency, dysuria, or hematuria. Musculoskeletal:  No back pain.  No joint pain.  No muscle tenderness. Extremities:  No pain or swelling. Skin:  No rashes or skin changes. Neuro:  No headache, numbness or weakness, balance or coordination issues. Endocrine:  Diabetes.  No thyroid issues, hot flashes or night sweats. Psych:  No mood changes, depression or anxiety.  Poor sleep. Pain:  No focal pain. Review of systems:  All other systems reviewed and found to be negative.   Physical Exam: Blood pressure  143/75, pulse 18, temperature 97.7 F (36.5 C), temperature source Tympanic, height _0  (1.702 m), weight 218 lb 5.9 oz (99.05 kg).  GENERAL:  Well developed, well nourished, gentleman sitting comfortably in the exam room in no acute distress. MENTAL STATUS:  Alert and oriented to person, place and time. HEAD:  Pearline Cables short hair.  Normocephalic, atraumatic, face symmetric, no Cushingoid features. EYES:  Glasses. Blue eyes.  No conjunctivitis or scleral icterus. NEUROLOGICAL: Unremarkable. PSYCH:  Appropriate.   No visits with results within 3 Day(s) from this visit. Latest known visit with results is:  Hospital Outpatient Visit on 11/03/2015  Component Date Value Ref Range Status  . aPTT 11/03/2015 30  24 - 36 seconds Final  . WBC 11/03/2015 2.0* 3.8 - 10.6 K/uL Final  . RBC 11/03/2015 2.26* 4.40 - 5.90 MIL/uL Final  . Hemoglobin 11/03/2015 8.6* 13.0 - 18.0 g/dL Final  . HCT 11/03/2015 25.3* 40.0 - 52.0 % Final  . MCV 11/03/2015 112.0* 80.0 - 100.0 fL Final  . MCH 11/03/2015 38.0* 26.0 - 34.0 pg Final  . MCHC 11/03/2015 34.0  32.0 - 36.0 g/dL Final  . RDW 11/03/2015 15.8* 11.5 - 14.5 % Final  . Platelets 11/03/2015 86* 150 - 440 K/uL Final  . Prothrombin Time 11/03/2015 14.2  11.4 - 15.0 seconds Final  . INR 11/03/2015 1.08   Final  . Neutrophils Relative % 11/03/2015 68   Final  . Lymphocytes Relative 11/03/2015 21   Final  . Monocytes Relative 11/03/2015 9   Final  . Eosinophils Relative 11/03/2015 2   Final  . Basophils Relative 11/03/2015 0   Final  . Band Neutrophils 11/03/2015 0   Final  . Metamyelocytes Relative 11/03/2015 0   Final  . Myelocytes 11/03/2015 0   Final  . Promyelocytes Absolute 11/03/2015 0   Final  . Blasts 11/03/2015 0   Final  . nRBC 11/03/2015 0  0 /100 WBC Final  . Other 11/03/2015 0   Final  . Neutro Abs 11/03/2015 1.4  1.4 - 6.5 K/uL Final  . Lymphs Abs 11/03/2015 0.4* 1.0 - 3.6 K/uL  Final  . Monocytes Absolute 11/03/2015 0.2  0.2 - 1.0 K/uL Final   . Eosinophils Absolute 11/03/2015 0.0  0 - 0.7 K/uL Final  . Basophils Absolute 11/03/2015 0.0  0 - 0.1 K/uL Final  . Smear Review 11/03/2015 MORPHOLOGY UNREMARKABLE   Final    Assessment:  MANLEY FASON is a 81 y.o. male with recurrent hairy cell leukemia.  He was diagnosed with hairy cell leukemia in 2006.  He received cladribine 11/06-11/05/2005.  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  Bone marrow aspirate and biopsy on 02/07/2015 revealed a extensive marrow involvement by recurrent hairy cell leukemia (approximately 80% of cells in the core). There was a monoclonal plasma cell infiltrate (approximately 10%), compatible with a plasma cell neoplasm. Marrow was hypercellular (40-50%) with markedly diminished residual trilineage hematopoiesis and mild multilineage dyspoiesis.  Peripheral smear revealed leukopenia (WBC 1900) with 2% atypical lymphoid cells compatible with hairy cell leukemia. There was moderate anemia with anisopoikilocytosis. 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  FISH studies for MDS were negative.  Cytogenetics were normal (46, XY).  He has an unclear allergy history.  He may be allergic to Primaxin, Zoloft, allopurinol, and a chemotherapy medication.  Reaction occurred in 06/2005.  He is allergic to sulfa.  He is not allergic to dapsone.  He requires washed RBCs.  He is on prophylactic acyclovir and dapsone.  Voriconazole was discontinued.  He is currently day 260 status post cladribine (began 03/03/2015).  His counts plateaued and are now decreasing.  Bone marrow on 11/03/2015 revealed persistent plasma cell neoplasm, now with mildy atypical monoclonal plasma cells estimated at 20-30%.   There was no morphologic evidence of residual hairy cell leukemia.  There was a minute population (0.03%) with hairy cell immunophenotype detected by flow.  Marrow was  normocellular for age with relative erythroid hyperplasia, relative myeloid hypoplasia, mild dyspoiesis, adequate megakaryocytes and no increased blasts.  There was no significant increase in marrow reticulin fibers.  Iron was present.  FISH studies for MDS were negative.  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  Cytogenetics were normal (46, XY).  Symptomatically, he has chronic fatigue.  Exam is stable.  He denies any fevers or infections.   Plan: 1.  Discuss recent bone marrow.  Minute evidence of residual hairy cell leukemia.  Increase atypical monoclonal plasma cells (20-30%).  Mild dyspoiesis.  FISH studies negative for MDS.  Unclear based on current marrow exact etiology of progressive pancytopenia.  Possible sampling error.  Discuss restaging monoclonal gammopathy. 2.  Labs today:  CBC with diff, CMP, SPEP, free light chain ratio, beta2-microglobulin. 3.  Schedule bone survey. 4.  RTC in 2 weeks for MD assessment and review of additional work-up.    Lequita Asal, MD  11/17/2015

## 2015-11-17 NOTE — Progress Notes (Signed)
No changes since last visit. 

## 2015-11-20 ENCOUNTER — Encounter: Payer: Self-pay | Admitting: Family Medicine

## 2015-11-20 ENCOUNTER — Ambulatory Visit (INDEPENDENT_AMBULATORY_CARE_PROVIDER_SITE_OTHER): Payer: Medicare Other | Admitting: Family Medicine

## 2015-11-20 VITALS — BP 148/72 | HR 88 | Temp 98.4°F | Wt 219.8 lb

## 2015-11-20 DIAGNOSIS — I10 Essential (primary) hypertension: Secondary | ICD-10-CM | POA: Diagnosis not present

## 2015-11-20 DIAGNOSIS — C9142 Hairy cell leukemia, in relapse: Secondary | ICD-10-CM | POA: Diagnosis not present

## 2015-11-20 DIAGNOSIS — E119 Type 2 diabetes mellitus without complications: Secondary | ICD-10-CM | POA: Diagnosis not present

## 2015-11-20 DIAGNOSIS — G4700 Insomnia, unspecified: Secondary | ICD-10-CM | POA: Diagnosis not present

## 2015-11-20 DIAGNOSIS — Z794 Long term (current) use of insulin: Secondary | ICD-10-CM

## 2015-11-20 LAB — PROTEIN ELECTROPHORESIS, SERUM
A/G Ratio: 1.2 (ref 0.7–1.7)
Albumin ELP: 3.8 g/dL (ref 2.9–4.4)
Alpha-1-Globulin: 0.2 g/dL (ref 0.0–0.4)
Alpha-2-Globulin: 0.7 g/dL (ref 0.4–1.0)
Beta Globulin: 0.8 g/dL (ref 0.7–1.3)
Gamma Globulin: 1.5 g/dL (ref 0.4–1.8)
Globulin, Total: 3.2 g/dL (ref 2.2–3.9)
M-Spike, %: 1.3 g/dL — ABNORMAL HIGH
Total Protein ELP: 7 g/dL (ref 6.0–8.5)

## 2015-11-20 LAB — KAPPA/LAMBDA LIGHT CHAINS
Kappa free light chain: 50.5 mg/L — ABNORMAL HIGH (ref 3.30–19.40)
Kappa, lambda light chain ratio: 4.82 — ABNORMAL HIGH (ref 0.26–1.65)
Lambda free light chains: 10.48 mg/L (ref 5.71–26.30)

## 2015-11-20 LAB — BETA 2 MICROGLOBULIN, SERUM: Beta-2 Microglobulin: 2.5 mg/L — ABNORMAL HIGH (ref 0.6–2.4)

## 2015-11-20 LAB — HEMOGLOBIN A1C: Hgb A1c MFr Bld: 5.8 % (ref 4.6–6.5)

## 2015-11-20 MED ORDER — RANITIDINE HCL 150 MG PO TABS: 150.0000 mg | ORAL_TABLET | Freq: Two times a day (BID) | ORAL | Status: DC | PRN

## 2015-11-20 MED ORDER — TRAZODONE HCL 50 MG PO TABS: 25.0000 mg | ORAL_TABLET | Freq: Every evening | ORAL | Status: DC | PRN

## 2015-11-20 NOTE — Addendum Note (Signed)
Addended by: Ellamae Sia on: 11/20/2015 02:47 PM   Modules accepted: Orders

## 2015-11-20 NOTE — Assessment & Plan Note (Signed)
Check A1c, fructosamine. Goal A1c equivalent <8%. A1c not reliable given blood counts from leukemia. Continue current regimen for now.

## 2015-11-20 NOTE — Assessment & Plan Note (Addendum)
Mildly elevated. Continue current regimen, no changes today.

## 2015-11-20 NOTE — Patient Instructions (Addendum)
Try trazodone 50mg  nightly. Start at 1/2 tablet to ensure tolerated well.  I recommend pneumovax booster. Check with Dr [name] if safe to get - and if so come in for nurse visit for this.  Good to see you today, call us with questions. Return as needed or in 6 months for medicare wellness visit.  Blood work today.

## 2015-11-20 NOTE — Progress Notes (Signed)
Pre visit review using our clinic review tool, if applicable. No additional management support is needed unless otherwise documented below in the visit note. 

## 2015-11-20 NOTE — Assessment & Plan Note (Addendum)
Significant stressors including recurrent hairy cell leukemia, caring for wife, busy at work. Trial trazodone for sleep. Not interested in other medication at this time.

## 2015-11-20 NOTE — Progress Notes (Addendum)
BP 148/72 mmHg  Pulse 88  Temp(Src) 98.4 F (36.9 C) (Oral)  Wt 219 lb 12 oz (99.678 kg)   CC: 6 mo f/u visit  Subjective:    Patient ID: Alex Smith, male    DOB: 19-Aug-1935, 81 y.o.   MRN: [id number]  HPI: Alex Smith is a 81 y.o. male presenting on 11/20/2015 for Follow-up   Hairy Cell Leukemia - followed by Dr. [name] at [facility] onc. On chemo for 6 years. Relapsed 01/2015 treated with cladribine (02/2015). Counts trending down. Now on dapsone and acyclovir preventatively. R chest wall port in place. Feeling stressed - overwhelmed. Cares for wife as well. Interested in sleeping medicine because of trouble with this as well. Continues working for [employer].   DM - regularly does check sugars, running elevated recently. Up to 220. Compliant with antihyperglycemic regimen which includes: humulin R 10u BID AC, humulin N 35u BID, metformin 1000mg  bid. Denies low sugars or hypoglycemic symptoms. Denies paresthesias. Last diabetic eye exam 2015 - DUE.  Pneumovax: 2006.  Prevnar: 2015.  Lab Results  Component Value Date   HGBA1C 4.8 05/15/2015  A1c not reliable given leukemia. Fructosamine level stable at 240. Diabetic Foot Exam - Simple   Simple Foot Form  Diabetic Foot exam was performed with the following findings:  Yes 11/20/2015  2:37 PM  Visual Inspection  No deformities, no ulcerations, no other skin breakdown bilaterally:  Yes  Sensation Testing  Intact to touch and monofilament testing bilaterally:  Yes  Pulse Check  Posterior Tibialis and Dorsalis pulse intact bilaterally:  Yes  Comments      HTN - Compliant with current antihypertensive regimen of lisinopril 20mg  bid, hctz 25mg  daily. Does not check blood pressures at home. No low blood pressure readings or symptoms of dizziness/syncope.  Denies HA, vision changes, CP/tightness, SOB, leg swelling.    Relevant past medical, surgical, family and social history reviewed and updated as indicated. Interim medical history since  our last visit reviewed. Allergies and medications reviewed and updated. Current Outpatient Prescriptions on File Prior to Visit  Medication Sig  . acyclovir (ZOVIRAX) 400 MG tablet TAKE TWO TABLETS TWICE DAILY  . aspirin EC 81 MG tablet Take 81 mg by mouth daily.  . dapsone 100 MG tablet TAKE ONE TABLET BY MOUTH EVERY DAY  . glucose blood (ACCU-CHEK AVIVA PLUS) test strip Use to check sugar twice daily Dx:E11.9  . HUMULIN R 100 UNIT/ML injection INJCET 10 UNITS SUBCUTANEOUSLY TWICE DAILY BEFORE MEALS  . hydrochlorothiazide (HYDRODIURIL) 25 MG tablet TAKE ONE TABLET EVERY DAY  . insulin NPH Human (HUMULIN N) 100 UNIT/ML injection Inject 0.35 mLs (35 Units total) into the skin 2 (two) times daily before a meal.  . lidocaine-prilocaine (EMLA) cream Apply 1 application topically as needed.  Marland Kitchen lisinopril (PRINIVIL,ZESTRIL) 20 MG tablet TAKE ONE TABLET BY MOUTH TWICE DAILY  . metFORMIN (GLUCOPHAGE) 1000 MG tablet TAKE ONE TABLET BY MOUTH TWICE DAILY AS DIRECTED  . MICROLET LANCETS MISC USE AS DIRECTED  . ondansetron (ZOFRAN) 8 MG tablet Take 1 tablet (8 mg total) by mouth every 8 (eight) hours as needed for nausea or vomiting.  . pravastatin (PRAVACHOL) 20 MG tablet Take 1 tablet (20 mg total) by mouth at bedtime.  . tamsulosin (FLOMAX) 0.4 MG CAPS capsule Take 0.4 mg by mouth daily.  . vitamin B-12 (CYANOCOBALAMIN) 1000 MCG tablet Take 1,000 mcg by mouth daily.  . vitamin E 400 UNIT capsule Take 200 Units by mouth daily.  Current Facility-Administered Medications on File Prior to Visit  Medication  . heparin lock flush 100 unit/mL  . sodium chloride 0.9 % injection 10 mL  . sodium chloride 0.9 % injection 10 mL    Review of Systems Per HPI unless specifically indicated in ROS section     Objective:    BP 148/72 mmHg  Pulse 88  Temp(Src) 98.4 F (36.9 C) (Oral)  Wt 219 lb 12 oz (99.678 kg)  Wt Readings from Last 3 Encounters:  11/20/15 219 lb 12 oz (99.678 kg)  11/17/15 218 lb  5.9 oz (99.05 kg)  11/03/15 219 lb (99.338 kg)    Physical Exam  Constitutional: He appears well-developed and well-nourished. No distress.  HENT:  Head: Normocephalic and atraumatic.  Right Ear: External ear normal.  Left Ear: External ear normal.  Nose: Nose normal.  Mouth/Throat: Oropharynx is clear and moist. No oropharyngeal exudate.  Eyes: Conjunctivae and EOM are normal. Pupils are equal, round, and reactive to light. No scleral icterus.  Neck: Normal range of motion. Neck supple.  Cardiovascular: Normal rate, regular rhythm and intact distal pulses.   Murmur (mild systolic) heard. Pulmonary/Chest: Effort normal and breath sounds normal. No respiratory distress. He has no wheezes. He has no rales.  Musculoskeletal: He exhibits no edema.  See HPI for foot exam if done  Lymphadenopathy:    He has no cervical adenopathy.  Skin: Skin is warm and dry. No rash noted.  Psychiatric: He has a normal mood and affect.  Nursing note and vitals reviewed.  Results for orders placed or performed in visit on 11/17/15  CBC with Differential  Result Value Ref Range   WBC 2.4 (L) 3.8 - 10.6 K/uL   RBC 2.20 (L) 4.40 - 5.90 MIL/uL   Hemoglobin 8.6 (L) 13.0 - 18.0 g/dL   HCT 24.9 (L) 40.0 - 52.0 %   MCV 113.1 (H) 80.0 - 100.0 fL   MCH 39.0 (H) 26.0 - 34.0 pg   MCHC 34.5 32.0 - 36.0 g/dL   RDW 15.4 (H) 11.5 - 14.5 %   Platelets 94 (L) 150 - 440 K/uL   Neutrophils Relative % 59 %   Neutro Abs 1.4 1.4 - 6.5 K/uL   Lymphocytes Relative 26 %   Lymphs Abs 0.6 (L) 1.0 - 3.6 K/uL   Monocytes Relative 9 %   Monocytes Absolute 0.2 0.2 - 1.0 K/uL   Eosinophils Relative 5 %   Eosinophils Absolute 0.1 0 - 0.7 K/uL   Basophils Relative 1 %   Basophils Absolute 0.0 0 - 0.1 K/uL  Comprehensive metabolic panel  Result Value Ref Range   Sodium 136 135 - 145 mmol/L   Potassium 4.2 3.5 - 5.1 mmol/L   Chloride 108 101 - 111 mmol/L   CO2 24 22 - 32 mmol/L   Glucose, Bld 166 (H) 65 - 99 mg/dL   BUN 26  (H) 6 - 20 mg/dL   Creatinine, Ser 1.11 0.61 - 1.24 mg/dL   Calcium 9.1 8.9 - 10.3 mg/dL   Total Protein 7.2 6.5 - 8.1 g/dL   Albumin 4.2 3.5 - 5.0 g/dL   AST 25 15 - 41 U/L   ALT 20 17 - 63 U/L   Alkaline Phosphatase 57 38 - 126 U/L   Total Bilirubin 0.6 0.3 - 1.2 mg/dL   GFR calc non Af Amer >60 >60 mL/min   GFR calc Af Amer >60 >60 mL/min   Anion gap 4 (L) 5 - 15  Assessment & Plan:   Problem List Items Addressed This Visit    Essential hypertension    Mildly elevated. Continue current regimen, no changes today.      Type II diabetes mellitus (HCC) - Primary    Check A1c, fructosamine. Goal A1c equivalent <8%. A1c not reliable given blood counts from leukemia. Continue current regimen for now.      Relevant Orders   Hemoglobin A1c   Fructosamine   Hairy cell leukemia ([place])    Appreciate onc care. Close f/u with them.      Insomnia    Significant stressors including recurrent hairy cell leukemia, caring for wife, busy at work. Trial trazodone for sleep. Not interested in other medication at this time.           Follow up plan: No Follow-up on file.  Ria Bush, MD

## 2015-11-20 NOTE — Assessment & Plan Note (Addendum)
Appreciate onc care. Close f/u with them.

## 2015-11-21 LAB — FRUCTOSAMINE: FRUCTOSAMINE: 305 umol/L — AB (ref 0–285)

## 2015-11-22 ENCOUNTER — Other Ambulatory Visit: Payer: Self-pay | Admitting: *Deleted

## 2015-11-22 DIAGNOSIS — D472 Monoclonal gammopathy: Secondary | ICD-10-CM

## 2015-11-24 ENCOUNTER — Ambulatory Visit
Admission: RE | Admit: 2015-11-24 | Discharge: 2015-11-24 | Disposition: A | Discharge status: Home / Self Care | Payer: Medicare Other | Source: Ambulatory Visit | Attending: Hematology and Oncology | Admitting: Hematology and Oncology

## 2015-11-24 DIAGNOSIS — M160 Bilateral primary osteoarthritis of hip: Secondary | ICD-10-CM | POA: Insufficient documentation

## 2015-11-24 DIAGNOSIS — M47814 Spondylosis without myelopathy or radiculopathy, thoracic region: Secondary | ICD-10-CM | POA: Diagnosis not present

## 2015-11-24 DIAGNOSIS — C9590 Leukemia, unspecified not having achieved remission: Secondary | ICD-10-CM | POA: Diagnosis not present

## 2015-11-24 DIAGNOSIS — M47812 Spondylosis without myelopathy or radiculopathy, cervical region: Secondary | ICD-10-CM | POA: Diagnosis not present

## 2015-11-24 DIAGNOSIS — M47816 Spondylosis without myelopathy or radiculopathy, lumbar region: Secondary | ICD-10-CM | POA: Insufficient documentation

## 2015-11-24 DIAGNOSIS — D472 Monoclonal gammopathy: Secondary | ICD-10-CM

## 2015-11-24 LAB — UPEP/TP, 24-HR URINE
Albumin, U: 32.4 %
Alpha 1, Urine: 0.9 %
Alpha 2, Urine: 10.9 %
Beta, Urine: 32 %
Gamma Globulin, Urine: 23.8 %
Total Protein, Urine-Ur/day: 234.8 mg/24 hr — ABNORMAL HIGH (ref 30.0–150.0)
Total Protein, Urine: 20.6 mg/dL
Total Volume: 1140

## 2015-11-24 IMAGING — CR DG BONE SURVEY MET
1 series · 8 of 8 positions shown · non-contrast
Comparison: Chest x-ray [DATE]

CLINICAL DATA: Plasma cell neoplasm, leukemia, evaluate for bone
lesion

EXAM:
METASTATIC BONE SURVEY

[Series 1: dg bone survey met · 0.14mm/px · 8 of 22 slices shown]
[im 1/22]
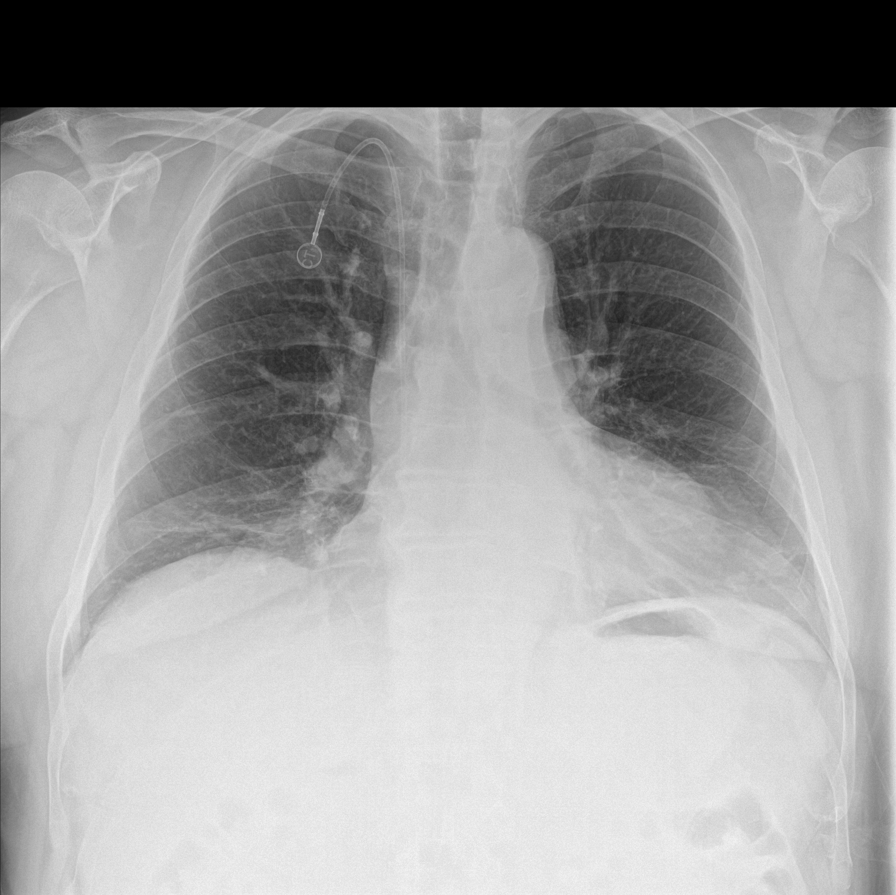
[im 4/22]
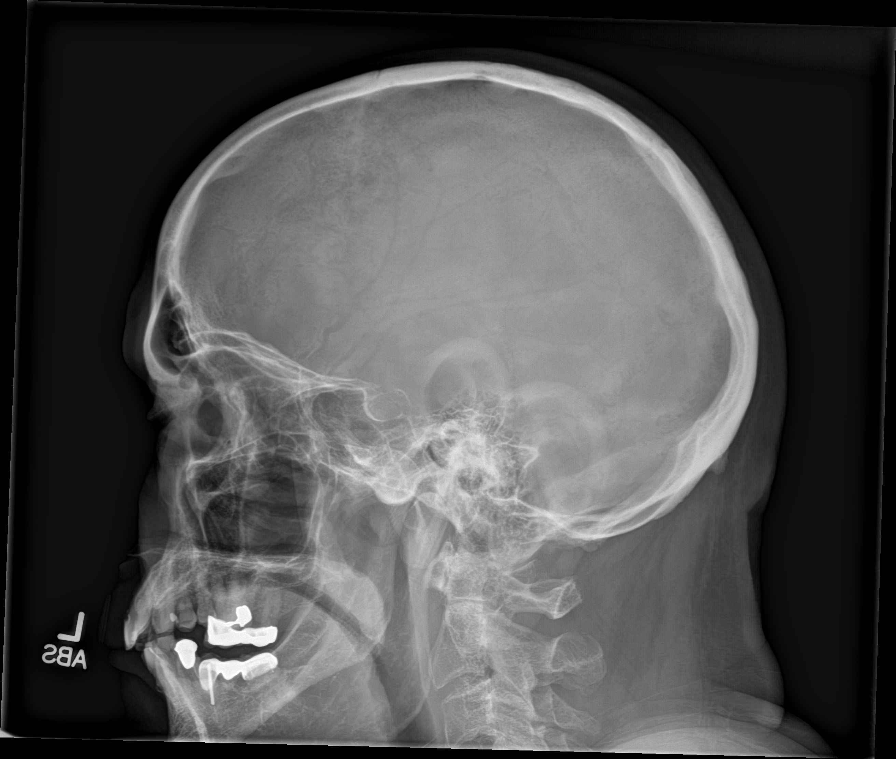
[im 7/22]
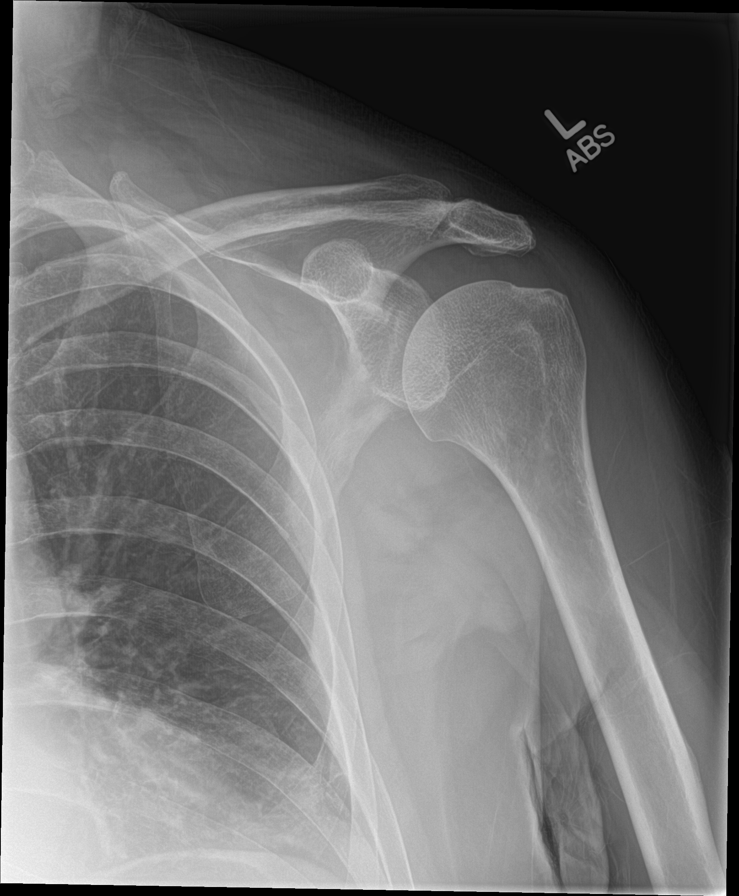
[im 10/22]
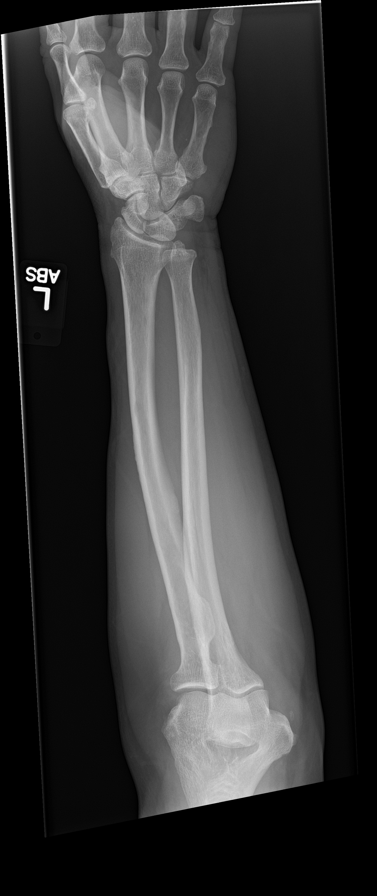
[im 13/22]
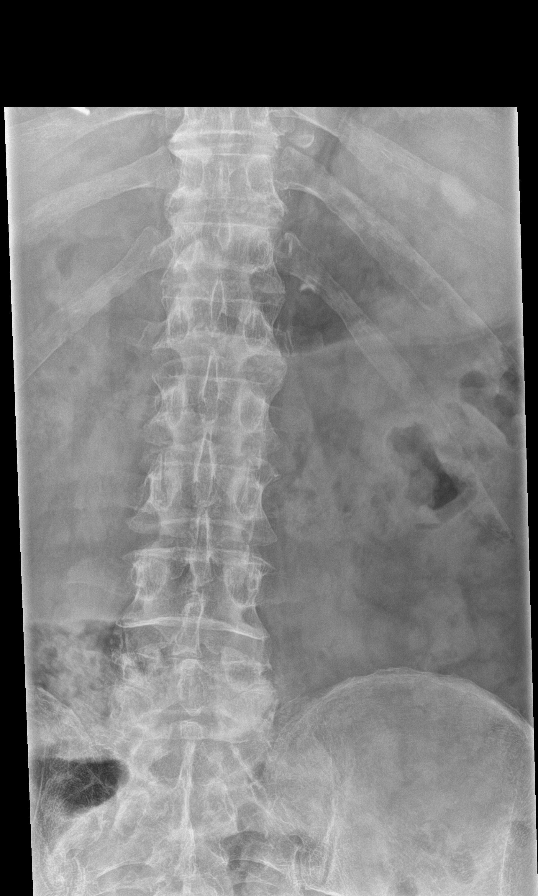
[im 16/22]
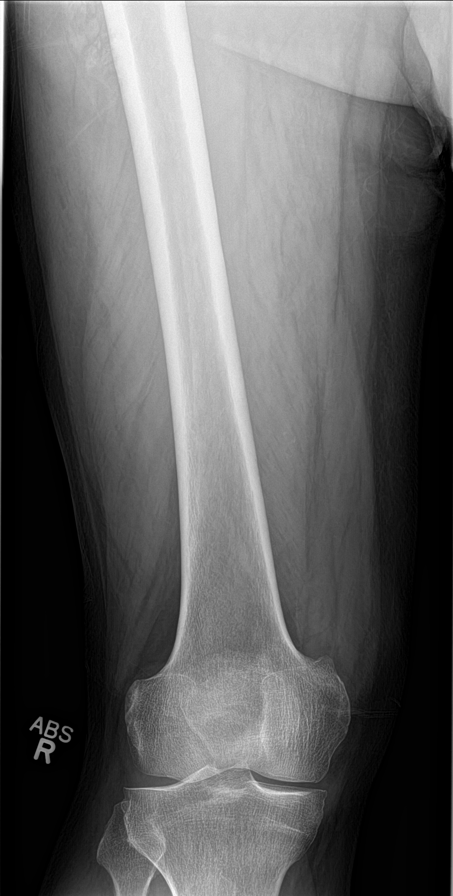
[im 19/22]
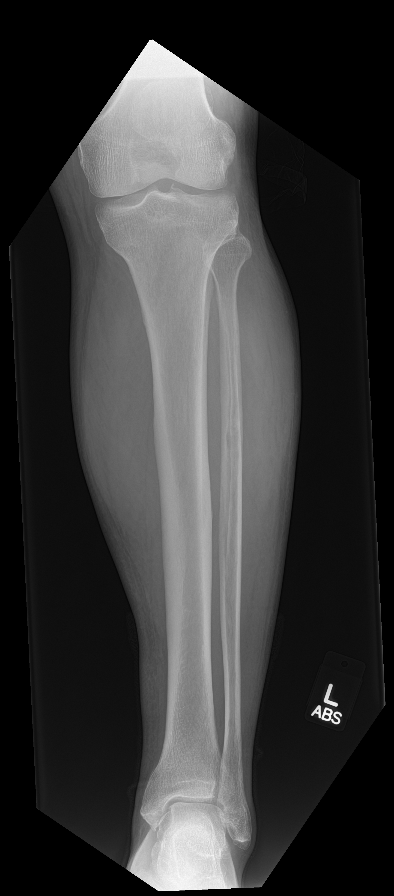
[im 22/22]
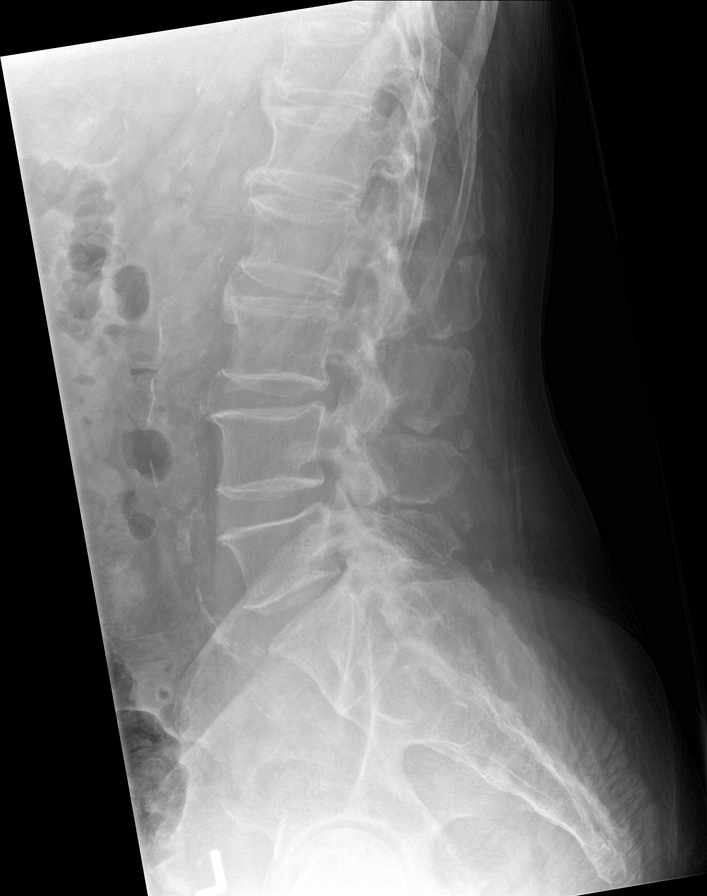

[8 of 8 positions shown; findings below may reference images not displayed]

FINDINGS: Frontal view of the chest shows no lytic or blastic lesions. Right
IJ Port-A-Cath with tip in SVC right atrium junction. Lateral view
of the skull shows no lytic or blastic lesions. Frontal and lateral
view of the cervical spine shows no lytic or blastic lesions. Mild
degenerative changes cervical spine.

Frontal view bilateral shoulders shows no lytic or blastic lesion.
Mild degenerative changes bilateral AC joint. Bilateral humerus
shows no lytic or blastic lesions. Bilateral forearm shows no lytic
or blastic lesions. Frontal and lateral view of the thoracic spine
shows no lytic or blastic lesions. Mild degenerative changes with
anterior spurring lower thoracic spine. Frontal and lateral view of
the lumbar spine shows no acute fracture or subluxation. No lytic or
blastic lesions. Multilevel mild anterior spurring. Facet
degenerative changes L4 and L5 level. Frontal view of the pelvis
shows no lytic or blastic lesions. Mild degenerative changes
bilateral hip joints with narrowing of superior hip joint space.
Bilateral femur shows no lytic or blastic lesions. Bilateral tibia
and fibula shows no lytic or blastic lesions.
IMPRESSION: 1. No evidence of lytic or blastic lesions. Degenerative changes
cervical spine, lower thoracic spine and lumbar spine. Mild
degenerative changes bilateral AC joints. Mild degenerative changes
bilateral hip joints.

## 2015-12-01 ENCOUNTER — Ambulatory Visit: Payer: Self-pay | Admitting: Hematology and Oncology

## 2015-12-01 ENCOUNTER — Other Ambulatory Visit: Payer: Self-pay | Admitting: Hematology and Oncology

## 2015-12-01 ENCOUNTER — Inpatient Hospital Stay: Payer: Medicare Other | Attending: Hematology and Oncology | Admitting: Hematology and Oncology

## 2015-12-01 ENCOUNTER — Encounter: Payer: Self-pay | Admitting: Hematology and Oncology

## 2015-12-01 VITALS — BP 180/70 | HR 87 | Temp 98.2°F | Resp 16 | Wt 218.0 lb

## 2015-12-01 DIAGNOSIS — Z8701 Personal history of pneumonia (recurrent): Secondary | ICD-10-CM

## 2015-12-01 DIAGNOSIS — K5790 Diverticulosis of intestine, part unspecified, without perforation or abscess without bleeding: Secondary | ICD-10-CM | POA: Insufficient documentation

## 2015-12-01 DIAGNOSIS — D46Z Other myelodysplastic syndromes: Secondary | ICD-10-CM

## 2015-12-01 DIAGNOSIS — K219 Gastro-esophageal reflux disease without esophagitis: Secondary | ICD-10-CM | POA: Diagnosis not present

## 2015-12-01 DIAGNOSIS — R5383 Other fatigue: Secondary | ICD-10-CM | POA: Insufficient documentation

## 2015-12-01 DIAGNOSIS — Z7982 Long term (current) use of aspirin: Secondary | ICD-10-CM | POA: Insufficient documentation

## 2015-12-01 DIAGNOSIS — Z808 Family history of malignant neoplasm of other organs or systems: Secondary | ICD-10-CM | POA: Diagnosis not present

## 2015-12-01 DIAGNOSIS — Z87891 Personal history of nicotine dependence: Secondary | ICD-10-CM | POA: Diagnosis not present

## 2015-12-01 DIAGNOSIS — Z794 Long term (current) use of insulin: Secondary | ICD-10-CM | POA: Diagnosis not present

## 2015-12-01 DIAGNOSIS — N400 Enlarged prostate without lower urinary tract symptoms: Secondary | ICD-10-CM | POA: Diagnosis not present

## 2015-12-01 DIAGNOSIS — E119 Type 2 diabetes mellitus without complications: Secondary | ICD-10-CM | POA: Diagnosis not present

## 2015-12-01 DIAGNOSIS — Z85828 Personal history of other malignant neoplasm of skin: Secondary | ICD-10-CM | POA: Diagnosis not present

## 2015-12-01 DIAGNOSIS — Z7984 Long term (current) use of oral hypoglycemic drugs: Secondary | ICD-10-CM | POA: Insufficient documentation

## 2015-12-01 DIAGNOSIS — Z9221 Personal history of antineoplastic chemotherapy: Secondary | ICD-10-CM | POA: Diagnosis not present

## 2015-12-01 DIAGNOSIS — C9000 Multiple myeloma not having achieved remission: Secondary | ICD-10-CM

## 2015-12-01 DIAGNOSIS — C9140 Hairy cell leukemia not having achieved remission: Secondary | ICD-10-CM

## 2015-12-01 DIAGNOSIS — E538 Deficiency of other specified B group vitamins: Secondary | ICD-10-CM | POA: Insufficient documentation

## 2015-12-01 DIAGNOSIS — D61818 Other pancytopenia: Secondary | ICD-10-CM | POA: Diagnosis not present

## 2015-12-01 DIAGNOSIS — C9141 Hairy cell leukemia, in remission: Secondary | ICD-10-CM

## 2015-12-01 DIAGNOSIS — Z79899 Other long term (current) drug therapy: Secondary | ICD-10-CM

## 2015-12-01 DIAGNOSIS — E785 Hyperlipidemia, unspecified: Secondary | ICD-10-CM | POA: Diagnosis not present

## 2015-12-01 DIAGNOSIS — I10 Essential (primary) hypertension: Secondary | ICD-10-CM | POA: Insufficient documentation

## 2015-12-01 DIAGNOSIS — Z86010 Personal history of colonic polyps: Secondary | ICD-10-CM | POA: Diagnosis not present

## 2015-12-01 NOTE — Progress Notes (Signed)
Patient her to discuss test results. 

## 2015-12-01 NOTE — Progress Notes (Signed)
[facility] day:  12/01/2015  Chief Complaint: Alex Smith is a 81 y.o. male with hairy cell leukemia who is seen for review of interval studies and discussion regarding direction of therapy.  HPI:  The patient was last seen in the medical oncology clinic on 11/17/2015.  At that time, we reviewed his recent bone marrow.  There was a minute evidence of residual hairy cell leukemia.  There was an increase in atypical monoclonal plasma cells (20-30%).  There was mild dyspoiesis.  FISH studies were negative for MDS.  The etiology of his progressive pancytopenia was unclear.  We discussed a [ossible sampling error.  We discussed restaging his monoclonal gammopathy.  SPEP revealed a 1.3 gm/dL monoclonal spike.  Kappa free light chains were 50.51, lambda free light chains were 10.48 with a ratio of 4.82.  Beta2-microglobulin was 2.5.  24 hour urine on 11/21/2015 revealed 234.8 mg protein in 24 hours (no monoclonal protein).  Bone survey on 11/24/2015 revealed no lytic lesions.  Symptomatically, he feels good.  He denies any bone pain.  He denies any fevers or infections.   Past Medical History  Diagnosis Date  . Hypertension   . GERD (gastroesophageal reflux disease)   . HLD (hyperlipidemia)   . History of pneumonia 2000's    "once" (07/07/2012)  . Type II diabetes mellitus ([place])   . Hairy cell leukemia ([place]) 2006    recurrent, seizure on rituxan, now on cladribine ([place])  . Basal cell carcinoma of face   . Diverticulosis   . Colon polyps   . BPH (benign prostatic hypertrophy)     followed by urology, discharged (Dr. Bernardo Heater)  . B12 deficiency   . History of shingles   . Shortness of breath dyspnea   . Pneumonia   . CAP (community acquired pneumonia) 02/15/2015    Past Surgical History  Procedure Laterality Date  . Eye surgery Left 06/2012    laser surgery  . Cataract extraction w/ intraocular lens  implant, bilateral  ~ 2010  . Skin  cancer excision      "all over my face" (07/07/2012)  . 25 gauge pars plana vitrectomy with 20 gauge mvr port for macular hole  07/07/2012    Procedure: 25 GAUGE PARS PLANA VITRECTOMY WITH 20 GAUGE MVR PORT FOR MACULAR HOLE;  Surgeon: Hayden Pedro, MD;  Location: [facility];  Service: Ophthalmology;  Laterality: Left;  . Serum patch  07/07/2012    Procedure: SERUM PATCH;  Surgeon: Hayden Pedro, MD;  Location: [place];  Service: Ophthalmology;  Laterality: Left;  . Gas insertion  07/07/2012    Procedure: INSERTION OF GAS;  Surgeon: Hayden Pedro, MD;  Location: [facility];  Service: Ophthalmology;  Laterality: Left;  [id number]  . Cardiovascular stress test  2013    treadmill - no evidence ischemia, EF 61%  . Colonoscopy  2014    [place] WNL no rpt needed, h/o polyps  . Peripheral vascular catheterization N/A 02/23/2015    Procedure: Glori Luis Cath Insertion;  Surgeon: Algernon Huxley, MD;  Location: [facility];  Service: Cardiovascular;  Laterality: N/A;  . Bone marrow biopsy  2016    Family History  Problem Relation Age of Onset  . Dementia Mother   . Heart failure Father 56  . Cancer Sister     breast  . Diabetes Paternal Uncle   . Diabetes Paternal Aunt   . CAD Brother 60  MI  . Stroke Neg Hx     Social History:  reports that he quit smoking about 46 years ago. His smoking use included Cigarettes. He has a 50 pack-year smoking history. He has never used smokeless tobacco. He reports that he drinks alcohol. He reports that he does not use illicit drugs.  He works at [employer] 20-32 hours/week.  The patient 's wife has had 3 hip operations.  He takes care of his wife.  He states that they either eat out or bring food in.  He is accompanied by his oldest son today.  Allergies:  Allergies  Allergen Reactions  . Rituximab Rash    Chest tightness  . Blood-Group Specific Substance Other (See Comments)    Had a post transfusion reaction of red blood cells; NOW REQUIRES WASHED BLOOD CELLS  .  Primaxin [Imipenem] Other (See Comments)    Possible allergy  . Sulfa Antibiotics Itching and Rash  Possible allergy to Zoloft, allopurinol, and a chemotherapy medication.  He tolerates Dapsone.  Current Medications: Current Outpatient Prescriptions  Medication Sig Dispense Refill  . acyclovir (ZOVIRAX) 400 MG tablet TAKE TWO TABLETS TWICE DAILY 120 tablet 3  . aspirin EC 81 MG tablet Take 81 mg by mouth daily.    . dapsone 100 MG tablet TAKE ONE TABLET BY MOUTH EVERY DAY 30 tablet 11  . glucose blood (ACCU-CHEK AVIVA PLUS) test strip Use to check sugar twice daily Dx:E11.9 200 each 3  . HUMULIN R 100 UNIT/ML injection INJCET 10 UNITS SUBCUTANEOUSLY TWICE DAILY BEFORE MEALS 10 mL 11  . hydrochlorothiazide (HYDRODIURIL) 25 MG tablet TAKE ONE TABLET EVERY DAY 30 tablet 11  . insulin NPH Human (HUMULIN N) 100 UNIT/ML injection Inject 0.35 mLs (35 Units total) into the skin 2 (two) times daily before a meal. 20 mL 11  . lidocaine-prilocaine (EMLA) cream Apply 1 application topically as needed. 30 g 3  . lisinopril (PRINIVIL,ZESTRIL) 20 MG tablet TAKE ONE TABLET BY MOUTH TWICE DAILY 60 tablet 6  . metFORMIN (GLUCOPHAGE) 1000 MG tablet TAKE ONE TABLET BY MOUTH TWICE DAILY AS DIRECTED 180 tablet 3  . MICROLET LANCETS MISC USE AS DIRECTED 200 each 3  . ondansetron (ZOFRAN) 8 MG tablet Take 1 tablet (8 mg total) by mouth every 8 (eight) hours as needed for nausea or vomiting. 20 tablet 0  . pravastatin (PRAVACHOL) 20 MG tablet Take 1 tablet (20 mg total) by mouth at bedtime. 90 tablet 2  . ranitidine (ZANTAC) 150 MG tablet Take 1 tablet (150 mg total) by mouth 2 (two) times daily as needed. 180 tablet 3  . tamsulosin (FLOMAX) 0.4 MG CAPS capsule Take 0.4 mg by mouth daily.    . traZODone (DESYREL) 50 MG tablet Take 0.5-1 tablets (25-50 mg total) by mouth at bedtime as needed for sleep. 30 tablet 3  . vitamin B-12 (CYANOCOBALAMIN) 1000 MCG tablet Take 1,000 mcg by mouth daily.    . vitamin E 400 UNIT  capsule Take 200 Units by mouth daily.      No current facility-administered medications for this visit.   Facility-Administered Medications Ordered in Other Visits  Medication Dose Route Frequency Provider Last Rate Last Dose  . heparin lock flush 100 unit/mL  500 Units Intravenous Once Lequita Asal, MD      . sodium chloride 0.9 % injection 10 mL  10 mL Intravenous PRN Lequita Asal, MD   10 mL at 03/03/15 0903  . sodium chloride 0.9 % injection 10  mL  10 mL Intracatheter PRN Lequita Asal, MD   10 mL at 03/10/15 1410   Review of Systems:  GENERAL:  Fatigue. No fevers or sweats.  Weight stable. PERFORMANCE STATUS (ECOG):  1 HEENT:  No visual changes, runny nose, sore throat. Pulmonary:  Shortness of breath with exertion.  No hemoptysis. Cardiac:  No chest pain, palpitations, orthopnea, or PND. GI:  Appetite 75%.  No nausea, vomiting, diarrhea, constipation, melena or hematochezia. GU:  No urgency, frequency, dysuria, or hematuria. Musculoskeletal:  No back pain.  No joint pain.  No muscle tenderness. Extremities:  No pain or swelling. Skin:  No rashes or skin changes. Neuro:  No headache, numbness or weakness, balance or coordination issues. Endocrine:  Diabetes.  No thyroid issues, hot flashes or night sweats. Psych:  No mood changes, depression or anxiety.  Poor sleep. Pain:  No focal pain. Review of systems:  All other systems reviewed and found to be negative.   Physical Exam: Blood pressure 180/70, pulse 87, temperature 98.2 F (36.8 C), temperature source Oral, resp. rate 16, weight 218 lb 0.6 oz (98.9 kg), SpO2 93 %.  GENERAL:  Well developed, well nourished, gentleman sitting comfortably in the exam room in no acute distress. MENTAL STATUS:  Alert and oriented to person, place and time. HEAD:  Pearline Cables short hair.  Normocephalic, atraumatic, face symmetric, no Cushingoid features. EYES:  Glasses. Blue eyes.  Pupils equal round and reactive to light and  accomodation.  No conjunctivitis or scleral icterus. ENT:  Oropharynx clear without lesion.  Tongue normal. Mucous membranes moist.  RESPIRATORY:  Clear to auscultation without rales, wheezes or rhonchi. CARDIOVASCULAR:  Regular rate and rhythm without murmur, rub or gallop. ABDOMEN:  Soft, non-tender, with active bowel sounds, and no hepatosplenomegaly.  No masses. SKIN:  No rashes, ulcers or lesions. EXTREMITIES: No edema, no skin discoloration or tenderness.  No palpable cords. LYMPH NODES: No palpable cervical, supraclavicular, axillary or inguinal adenopathy  NEUROLOGICAL: Unremarkable. PSYCH:  Appropriate.   No visits with results within 3 Day(s) from this visit. Latest known visit with results is:  Orders Only on 11/22/2015  Component Date Value Ref Range Status  . Total Protein, Urine 11/21/2015 20.6  Not Estab. mg/dL Final  . Total Protein, Urine-Ur/day 11/21/2015 234.8* 30.0 - 150.0 mg/24 hr Final  . Albumin, U 11/21/2015 32.4   Final  . Alpha 1, Urine 11/21/2015 0.9   Final  . Alpha 2, Urine 11/21/2015 10.9   Final  . Beta, Urine 11/21/2015 32.0   Final  . Gamma Globulin, Urine 11/21/2015 23.8   Final  . M-spike, % 11/21/2015 Not Observed  Not Observed % Final  . PLEASE NOTE: 11/21/2015 Comment   Final   Comment: (NOTE) Protein electrophoresis scan will follow via computer, mail, or courier delivery. Performed At: [facility] [place], [place] [id number] Lindon Romp MD [id number]   . Total Volume 11/21/2015 1140   Final    Assessment:  MAHAMUD METTS is a 81 y.o. 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  He was diagnosed with hairy cell leukemia in 2006.  He received cladribine 11/06-11/05/2005.  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   Bone marrow aspirate and biopsy on 02/07/2015 revealed a extensive marrow involvement by recurrent hairy cell leukemia (approximately 80% of cells in the core).  There was a monoclonal plasma cell infiltrate (approximately 10%), compatible with a plasma cell neoplasm. Marrow was hypercellular (40-50%) with  markedly diminished residual trilineage hematopoiesis and mild multilineage dyspoiesis.  Peripheral smear revealed leukopenia (WBC 1900) with 2% atypical lymphoid cells compatible with hairy cell leukemia. There was moderate anemia with anisopoikilocytosis. 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  FISH studies for MDS were negative.  Cytogenetics were normal (46, XY).  He has an unclear allergy history.  He may be allergic to Primaxin, Zoloft, allopurinol, and a chemotherapy medication.  Reaction occurred in 06/2005.  He is allergic to sulfa.  He is not allergic to dapsone.  He requires washed RBCs.  He is on prophylactic acyclovir and dapsone.  Voriconazole was discontinued.  He has a history of shingles prior to prophylactic acyclovir.  He is currently day 274 status post cladribine (began 03/03/2015).  His counts plateaued on 07/25/2015 and have subsequently decreased.  Bone marrow on 11/03/2015 revealed persistent plasma cell neoplasm, now with mildy atypical monoclonal plasma cells estimated at 20-30%.   There was no morphologic evidence of residual hairy cell leukemia.  There was a minute population (0.03%) with hairy cell immunophenotype detected by flow.  Marrow was normocellular for age with relative erythroid hyperplasia, relative myeloid hypoplasia, mild dyspoiesis, adequate megakaryocytes and no increased blasts.  There was no significant increase in marrow reticulin fibers.  Iron was present.  FISH studies for MDS were negative.  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  Cytogenetics were normal (46, XY).  SPEP on 11/17/2015 revealed a 1.3 gm/dL monoclonal spike (1.4 gm/dL on 07/25/2015 and 1.0 on 02/03/2015).  Kappa free light chains  were 50.51, lambda free light chains were 10.48 with a ratio of 4.82 (ratio 3.68 on 07/25/2015).  Beta2-microglobulin was 2.5.  24 hour urine on 11/21/2015 revealed 234.8 mg protein in 24 hours (no monoclonal protein).  Bone survey on 11/24/2015 revealed no lytic lesions.  Symptomatically, he has chronic fatigue.  Exam is stable.  He denies any fevers or infections.   Plan: 1.  Review restaging studies for monoclonal gammopathy.  He appears to have a stage I asymptomatic myeloma unless sampling error. 2.  Discuss possible early MDS.  Discuss indications for treatment.  Discuss follow-up with pathologist.  Discuss referral to [facility] or [facility]. 3.  Discontinue dapsone. 4.  RTC in 2 weeks for CBC with diff. 5.  RTC in 4 weeks for MD assessment, labs (CBC with diff, retic, Coom[place]), and port flush.  Addendum:  The pathologist from [facility] reviewed all material.  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   Lequita Asal, MD  12/01/2015, 10:07 AM

## 2015-12-03 ENCOUNTER — Encounter: Payer: Self-pay | Admitting: Hematology and Oncology

## 2015-12-07 ENCOUNTER — Ambulatory Visit (INDEPENDENT_AMBULATORY_CARE_PROVIDER_SITE_OTHER): Payer: Medicare Other | Admitting: Primary Care

## 2015-12-07 VITALS — BP 146/64 | HR 83 | Temp 98.1°F | Ht 67.0 in | Wt 218.8 lb

## 2015-12-07 DIAGNOSIS — J069 Acute upper respiratory infection, unspecified: Secondary | ICD-10-CM | POA: Diagnosis not present

## 2015-12-07 DIAGNOSIS — B9789 Other viral agents as the cause of diseases classified elsewhere: Principal | ICD-10-CM

## 2015-12-07 NOTE — Progress Notes (Signed)
Subjective:    Patient ID: Alex Smith, male    DOB: 1934/11/24, 81 y.o.   MRN: [id number]  HPI  Alex Smith is a 81 year old male who presents today with a chief complaint of cough. He also reports low grade fever, nasal congestion, sore throat. His symptoms have been present since Monday this week. His cough is mildly productive with clear sputum. He has had no fevers today and he's feeling well overall. He's been taking Robitussin OTC with improvement in cough. He's been around a few co-workers who've been ill with similar symptoms.  Review of Systems  Constitutional: Positive for fever. Negative for fatigue.  HENT: Positive for congestion, rhinorrhea and sneezing. Negative for sinus pressure.   Respiratory: Positive for cough. Negative for shortness of breath.   Cardiovascular: Negative for chest pain.       Past Medical History  Diagnosis Date  . Hypertension   . GERD (gastroesophageal reflux disease)   . HLD (hyperlipidemia)   . History of pneumonia 2000's    "once" (07/07/2012)  . Type II diabetes mellitus ([place])   . 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 Basal cell carcinoma of face   . Diverticulosis   . Colon polyps   . BPH (benign prostatic hypertrophy)     followed by urology, discharged (Dr. Bernardo Heater)  . B12 deficiency   . History of shingles   . Shortness of breath dyspnea   . Pneumonia   . CAP (community acquired pneumonia) 02/15/2015     Social History   Social History  . Marital Status: Married    Spouse Name: N/A  . Number of Children: N/A  . Years of Education: N/A   Occupational History  . Not on file.   Social History Main Topics  . Smoking status: Former Smoker -- 2.00 packs/day for 25 years    Types: Cigarettes    Quit date: 02/06/1969  . Smokeless tobacco: Never Used     Comment: 07/07/2012 "stopped smoking ~ 40 yr ago; smoked 20-28yr  . Alcohol Use: 0.0 oz/week    0 Standard  drinks or equivalent per week     Comment: rare  . Drug Use: No  . Sexual Activity: No   Other Topics Concern  . Not on file   Social History Narrative   Married >50 yrs, cares for wife. 1 cat   Occupation: was cArt gallery manager still works for [employer]   Activity: likes to golf   Diet: moderate water, fruits/vegetables daily    Past Surgical History  Procedure Laterality Date  . Eye surgery Left 06/2012    laser surgery  . Cataract extraction w/ intraocular lens  implant, bilateral  ~ 2010  . Skin cancer excision      "all over my face" (07/07/2012)  . 25 gauge pars plana vitrectomy with 20 gauge mvr port for macular hole  07/07/2012    Procedure: 25 GAUGE PARS PLANA VITRECTOMY WITH 20 GAUGE MVR PORT FOR MACULAR HOLE;  Surgeon: JHayden Pedro MD;  [facility]  Service: Ophthalmology;  Laterality: Left;  . Serum patch  07/07/2012    Procedure: SERUM PATCH;  Surgeon: JHayden Pedro MD;  [facility]  Service: Ophthalmology;  Laterality: Left;  . Gas insertion  07/07/2012    Procedure: INSERTION OF GAS;  Surgeon: JHayden Pedro MD;  [facility]  Service: Ophthalmology;  Laterality: Left;  [id number]  . Cardiovascular stress test  2013    treadmill - no evidence ischemia, EF 61%  . Colonoscopy  2014    [place] WNL no rpt needed, h/o polyps  . Peripheral vascular catheterization N/A 02/23/2015    Procedure: Glori Luis Cath Insertion;  Surgeon: Algernon Huxley, MD;  Location: [facility];  Service: Cardiovascular;  Laterality: N/A;  . Bone marrow biopsy  2016    Family History  Problem Relation Age of Onset  . Dementia Mother   . Heart failure Father 84  . Cancer Sister     breast  . Diabetes Paternal Uncle   . Diabetes Paternal Aunt   . CAD Brother 49    MI  . Stroke Neg Hx     Allergies  Allergen Reactions  . Rituximab Rash    Chest tightness  . Blood-Group Specific Substance Other (See Comments)    Had a post transfusion reaction of red blood cells;  NOW REQUIRES WASHED BLOOD CELLS  . Primaxin [Imipenem] Other (See Comments)    Possible allergy  . Sulfa Antibiotics Itching and Rash    Current Outpatient Prescriptions on File Prior to Visit  Medication Sig Dispense Refill  . acyclovir (ZOVIRAX) 400 MG tablet TAKE TWO TABLETS TWICE DAILY 120 tablet 3  . aspirin EC 81 MG tablet Take 81 mg by mouth daily.    Marland Kitchen glucose blood (ACCU-CHEK AVIVA PLUS) test strip Use to check sugar twice daily Dx:E11.9 200 each 3  . HUMULIN R 100 UNIT/ML injection INJCET 10 UNITS SUBCUTANEOUSLY TWICE DAILY BEFORE MEALS 10 mL 11  . hydrochlorothiazide (HYDRODIURIL) 25 MG tablet TAKE ONE TABLET EVERY DAY 30 tablet 11  . insulin NPH Human (HUMULIN N) 100 UNIT/ML injection Inject 0.35 mLs (35 Units total) into the skin 2 (two) times daily before a meal. 20 mL 11  . lidocaine-prilocaine (EMLA) cream Apply 1 application topically as needed. 30 g 3  . lisinopril (PRINIVIL,ZESTRIL) 20 MG tablet TAKE ONE TABLET BY MOUTH TWICE DAILY 60 tablet 6  . metFORMIN (GLUCOPHAGE) 1000 MG tablet TAKE ONE TABLET BY MOUTH TWICE DAILY AS DIRECTED 180 tablet 3  . MICROLET LANCETS MISC USE AS DIRECTED 200 each 3  . pravastatin (PRAVACHOL) 20 MG tablet Take 1 tablet (20 mg total) by mouth at bedtime. 90 tablet 2  . ranitidine (ZANTAC) 150 MG tablet Take 1 tablet (150 mg total) by mouth 2 (two) times daily as needed. 180 tablet 3  . tamsulosin (FLOMAX) 0.4 MG CAPS capsule Take 0.4 mg by mouth daily.    . traZODone (DESYREL) 50 MG tablet Take 0.5-1 tablets (25-50 mg total) by mouth at bedtime as needed for sleep. 30 tablet 3  . vitamin B-12 (CYANOCOBALAMIN) 1000 MCG tablet Take 1,000 mcg by mouth daily.    . vitamin E 400 UNIT capsule Take 200 Units by mouth daily.     . dapsone 100 MG tablet TAKE ONE TABLET BY MOUTH EVERY DAY (Patient not taking: Reported on 12/07/2015) 30 tablet 11   Current Facility-Administered Medications on File Prior to Visit  Medication Dose Route Frequency Provider  Last Rate Last Dose  . heparin lock flush 100 unit/mL  500 Units Intravenous Once Lequita Asal, MD      . sodium chloride 0.9 % injection 10 mL  10 mL Intravenous PRN Lequita Asal, MD   10 mL at 03/03/15 0903  . sodium chloride 0.9 % injection 10 mL  10 mL Intracatheter PRN Lequita Asal,  MD   10 mL at 03/10/15 1410    BP 146/64 mmHg  Pulse 83  Temp(Src) 98.1 F (36.7 C) (Oral)  Ht _0  (1.702 m)  Wt 218 lb 12.8 oz (99.247 kg)  BMI 34.26 kg/m2  SpO2 96%    Objective:   Physical Exam  Constitutional: He appears well-nourished.  HENT:  Right Ear: Tympanic membrane and ear canal normal.  Left Ear: Tympanic membrane and ear canal normal.  Nose: No mucosal edema. Right sinus exhibits no maxillary sinus tenderness and no frontal sinus tenderness. Left sinus exhibits no maxillary sinus tenderness and no frontal sinus tenderness.  Mouth/Throat: Oropharynx is clear and moist.  Eyes: Conjunctivae are normal.  Neck: Neck supple.  Cardiovascular: Normal rate and regular rhythm.   Pulmonary/Chest: Effort normal and breath sounds normal. He has no wheezes. He has no rales.  Skin: Skin is warm and dry.          Assessment & Plan:  URI:  Cough with low grade fevers x 3 days, overall not feeling bad. No increased fatigue, fevers today, wheezing. Improvement with OTC Robitussin. Exam today with mild upper lobe congestion, no wheezing, does not appear ill, vitals stable. Suspect viral involvement and will treat with supportive measures. Continue Robitussin, may try Mucinex for chest congestion, flonase for nasal congestion. Fluids, rest, return precautions provided.

## 2015-12-07 NOTE — Patient Instructions (Signed)
Your symptoms are likely related to a viral infection that will resolve on its own.  Continue Robitussin as needed for cough.  Cough/Congestion: Try taking Mucinex DM. This will help loosen up the mucous in your chest. Ensure you take this medication with a full glass of water.  Nasal Congestion: Try using Flonase (fluticasone) nasal spray. Instill 2 sprays in each nostril once daily.   Please notify me if you develop persistent fevers of 101, start coughing up green mucous, notice increased fatigue or weakness, or feel worse after 1 week of onset of symptoms.   Increase consumption of water intake and rest.  It was a pleasure to see you today!  Upper Respiratory Infection, Adult Most upper respiratory infections (URIs) are a viral infection of the air passages leading to the lungs. A URI affects the nose, throat, and upper air passages. The most common type of URI is nasopharyngitis and is typically referred to as "the common cold." URIs run their course and usually go away on their own. Most of the time, a URI does not require medical attention, but sometimes a bacterial infection in the upper airways can follow a viral infection. This is called a secondary infection. Sinus and middle ear infections are common types of secondary upper respiratory infections. Bacterial pneumonia can also complicate a URI. A URI can worsen asthma and chronic obstructive pulmonary disease (COPD). Sometimes, these complications can require emergency medical care and may be life threatening.  CAUSES Almost all URIs are caused by viruses. A virus is a type of germ and can spread from one person to another.  RISKS FACTORS You may be at risk for a URI if:   You smoke.   You have chronic heart or lung disease.  You have a weakened defense (immune) system.   You are very young or very old.   You have nasal allergies or asthma.  You work in crowded or poorly ventilated areas.  You work in health care  facilities or schools. SIGNS AND SYMPTOMS  Symptoms typically develop 2-3 days after you come in contact with a cold virus. Most viral URIs last 7-10 days. However, viral URIs from the influenza virus (flu virus) can last 14-18 days and are typically more severe. Symptoms may include:   Runny or stuffy (congested) nose.   Sneezing.   Cough.   Sore throat.   Headache.   Fatigue.   Fever.   Loss of appetite.   Pain in your forehead, behind your eyes, and over your cheekbones (sinus pain).  Muscle aches.  DIAGNOSIS  Your health care provider may diagnose a URI by:  Physical exam.  Tests to check that your symptoms are not due to another condition such as:  Strep throat.  Sinusitis.  Pneumonia.  Asthma. TREATMENT  A URI goes away on its own with time. It cannot be cured with medicines, but medicines may be prescribed or recommended to relieve symptoms. Medicines may help:  Reduce your fever.  Reduce your cough.  Relieve nasal congestion. HOME CARE INSTRUCTIONS   Take medicines only as directed by your health care provider.   Gargle warm saltwater or take cough drops to comfort your throat as directed by your health care provider.  Use a warm mist humidifier or inhale steam from a shower to increase air moisture. This may make it easier to breathe.  Drink enough fluid to keep your urine clear or pale yellow.   Eat soups and other clear broths and maintain good  nutrition.   Rest as needed.   Return to work when your temperature has returned to normal or as your health care provider advises. You may need to stay home longer to avoid infecting others. You can also use a face mask and careful hand washing to prevent spread of the virus.  Increase the usage of your inhaler if you have asthma.   Do not use any tobacco products, including cigarettes, chewing tobacco, or electronic cigarettes. If you need help quitting, ask your health care  provider. PREVENTION  The best way to protect yourself from getting a cold is to practice good hygiene.   Avoid oral or hand contact with people with cold symptoms.   Wash your hands often if contact occurs.  There is no clear evidence that vitamin C, vitamin E, echinacea, or exercise reduces the chance of developing a cold. However, it is always recommended to get plenty of rest, exercise, and practice good nutrition.  SEEK MEDICAL CARE IF:   You are getting worse rather than better.   Your symptoms are not controlled by medicine.   You have chills.  You have worsening shortness of breath.  You have brown or red mucus.  You have yellow or brown nasal discharge.  You have pain in your face, especially when you bend forward.  You have a fever.  You have swollen neck glands.  You have pain while swallowing.  You have white areas in the back of your throat. SEEK IMMEDIATE MEDICAL CARE IF:   You have severe or persistent:  Headache.  Ear pain.  Sinus pain.  Chest pain.  You have chronic lung disease and any of the following:  Wheezing.  Prolonged cough.  Coughing up blood.  A change in your usual mucus.  You have a stiff neck.  You have changes in your:  Vision.  Hearing.  Thinking.  Mood. MAKE SURE YOU:   Understand these instructions.  Will watch your condition.  Will get help right away if you are not doing well or get worse.   This information is not intended to replace advice given to you by your health care provider. Make sure you discuss any questions you have with your health care provider.   Document Released: 01/29/2001 Document Revised: 12/20/2014 Document Reviewed: 11/10/2013 Elsevier Interactive Patient Education [employer].

## 2015-12-07 NOTE — Progress Notes (Signed)
Pre visit review using our clinic review tool, if applicable. No additional management support is needed unless otherwise documented below in the visit note. 

## 2015-12-14 DIAGNOSIS — N401 Enlarged prostate with lower urinary tract symptoms: Secondary | ICD-10-CM | POA: Diagnosis not present

## 2015-12-14 DIAGNOSIS — N138 Other obstructive and reflux uropathy: Secondary | ICD-10-CM | POA: Diagnosis not present

## 2015-12-15 ENCOUNTER — Telehealth: Payer: Self-pay | Admitting: *Deleted

## 2015-12-15 ENCOUNTER — Inpatient Hospital Stay: Payer: Medicare Other

## 2015-12-15 DIAGNOSIS — Z85828 Personal history of other malignant neoplasm of skin: Secondary | ICD-10-CM | POA: Diagnosis not present

## 2015-12-15 DIAGNOSIS — I10 Essential (primary) hypertension: Secondary | ICD-10-CM | POA: Diagnosis not present

## 2015-12-15 DIAGNOSIS — N400 Enlarged prostate without lower urinary tract symptoms: Secondary | ICD-10-CM | POA: Diagnosis not present

## 2015-12-15 DIAGNOSIS — Z7984 Long term (current) use of oral hypoglycemic drugs: Secondary | ICD-10-CM | POA: Diagnosis not present

## 2015-12-15 DIAGNOSIS — E538 Deficiency of other specified B group vitamins: Secondary | ICD-10-CM | POA: Diagnosis not present

## 2015-12-15 DIAGNOSIS — E119 Type 2 diabetes mellitus without complications: Secondary | ICD-10-CM | POA: Diagnosis not present

## 2015-12-15 DIAGNOSIS — Z808 Family history of malignant neoplasm of other organs or systems: Secondary | ICD-10-CM | POA: Diagnosis not present

## 2015-12-15 DIAGNOSIS — Z9221 Personal history of antineoplastic chemotherapy: Secondary | ICD-10-CM | POA: Diagnosis not present

## 2015-12-15 DIAGNOSIS — K219 Gastro-esophageal reflux disease without esophagitis: Secondary | ICD-10-CM | POA: Diagnosis not present

## 2015-12-15 DIAGNOSIS — C9140 Hairy cell leukemia not having achieved remission: Secondary | ICD-10-CM | POA: Diagnosis not present

## 2015-12-15 DIAGNOSIS — Z87891 Personal history of nicotine dependence: Secondary | ICD-10-CM | POA: Diagnosis not present

## 2015-12-15 DIAGNOSIS — E785 Hyperlipidemia, unspecified: Secondary | ICD-10-CM | POA: Diagnosis not present

## 2015-12-15 DIAGNOSIS — D61818 Other pancytopenia: Secondary | ICD-10-CM | POA: Diagnosis not present

## 2015-12-15 DIAGNOSIS — Z7982 Long term (current) use of aspirin: Secondary | ICD-10-CM | POA: Diagnosis not present

## 2015-12-15 DIAGNOSIS — Z86010 Personal history of colonic polyps: Secondary | ICD-10-CM | POA: Diagnosis not present

## 2015-12-15 DIAGNOSIS — R5383 Other fatigue: Secondary | ICD-10-CM | POA: Diagnosis not present

## 2015-12-15 DIAGNOSIS — K5790 Diverticulosis of intestine, part unspecified, without perforation or abscess without bleeding: Secondary | ICD-10-CM | POA: Diagnosis not present

## 2015-12-15 DIAGNOSIS — C9141 Hairy cell leukemia, in remission: Secondary | ICD-10-CM

## 2015-12-15 DIAGNOSIS — Z79899 Other long term (current) drug therapy: Secondary | ICD-10-CM | POA: Diagnosis not present

## 2015-12-15 DIAGNOSIS — Z794 Long term (current) use of insulin: Secondary | ICD-10-CM | POA: Diagnosis not present

## 2015-12-15 LAB — CBC WITH DIFFERENTIAL/PLATELET
BASOS ABS: 0 10*3/uL (ref 0–0.1)
Basophils Relative: 0 %
EOS PCT: 5 %
Eosinophils Absolute: 0.1 10*3/uL (ref 0–0.7)
HCT: 26 % — ABNORMAL LOW (ref 40.0–52.0)
Hemoglobin: 9.2 g/dL — ABNORMAL LOW (ref 13.0–18.0)
Lymphocytes Relative: 23 %
Lymphs Abs: 0.6 10*3/uL — ABNORMAL LOW (ref 1.0–3.6)
MCH: 39.2 pg — ABNORMAL HIGH (ref 26.0–34.0)
MCHC: 35.3 g/dL (ref 32.0–36.0)
MCV: 111.2 fL — AB (ref 80.0–100.0)
MONO ABS: 0.3 10*3/uL (ref 0.2–1.0)
Monocytes Relative: 9 %
Neutro Abs: 1.7 10*3/uL (ref 1.4–6.5)
Neutrophils Relative %: 63 %
PLATELETS: 98 10*3/uL — AB (ref 150–440)
RBC: 2.34 MIL/uL — AB (ref 4.40–5.90)
RDW: 15.1 % — AB (ref 11.5–14.5)
WBC: 2.8 10*3/uL — AB (ref 3.8–10.6)

## 2015-12-15 NOTE — Telephone Encounter (Signed)
Pt waited for blood work. Dr. [name] spoke to [facility] md and they were going to call pt and so far he ahs not heard anything.  Asked him to call me on Monday after lunch if [facility] has not called him yet.  His wbc, hgb, and plt are better than last time. Pt agreeable to plan and given a copy of labs with what to say to triage if he needs to cal lback on monday

## 2015-12-20 ENCOUNTER — Telehealth: Payer: Self-pay | Admitting: *Deleted

## 2015-12-20 NOTE — Telephone Encounter (Signed)
Told pt that we had got  Call from him that [facility] not contacted him yet. Dr. [name] texted Dr. Bevely Palmer and she texted back laste yest. That she has sent the info to [facility] pt coordinator to get him an appt. If he has not heard anything by Monday to call us back and we will f/u with [facility] again. He is agreeable to the above plan

## 2015-12-23 IMAGING — US US ABDOMEN COMPLETE
1 series · 14 of 25 positions shown · non-contrast
Comparison: [DATE]

CLINICAL DATA: Hairy cell leukemia.  Abdominal pain

EXAM:
ULTRASOUND ABDOMEN COMPLETE

[Series 1: us abdomen complete · 0.24mm/px · 14 of 117 slices shown]
[im 1/117]
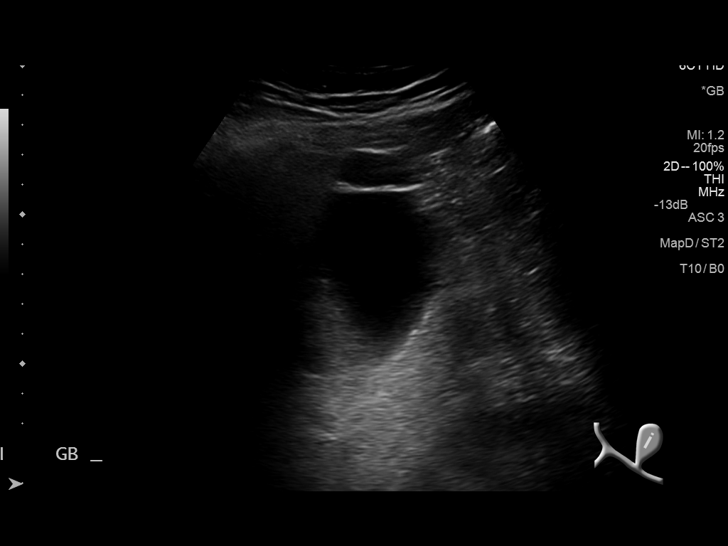
[im 10/117]
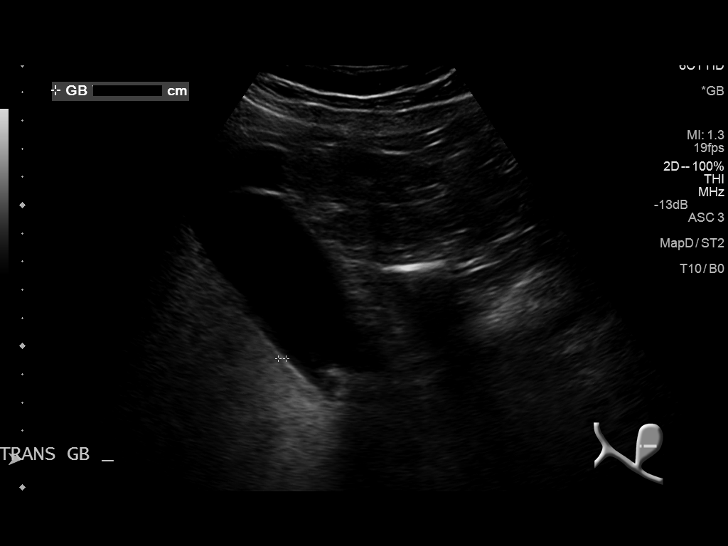
[im 20/117]
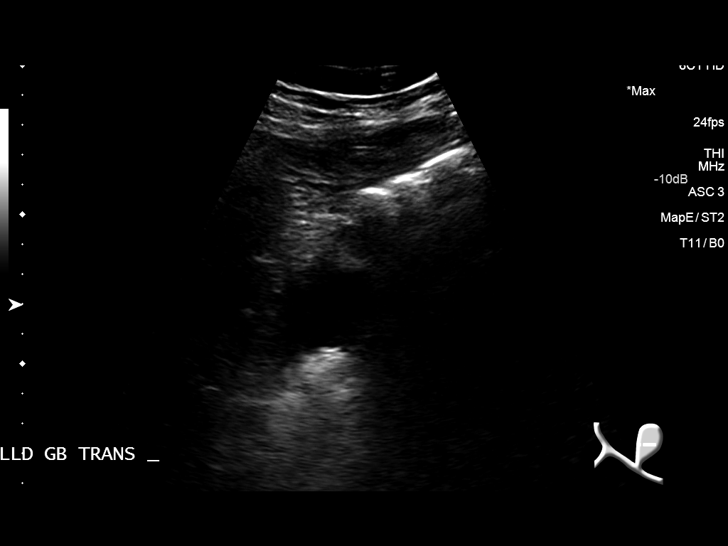
[im 30/117]
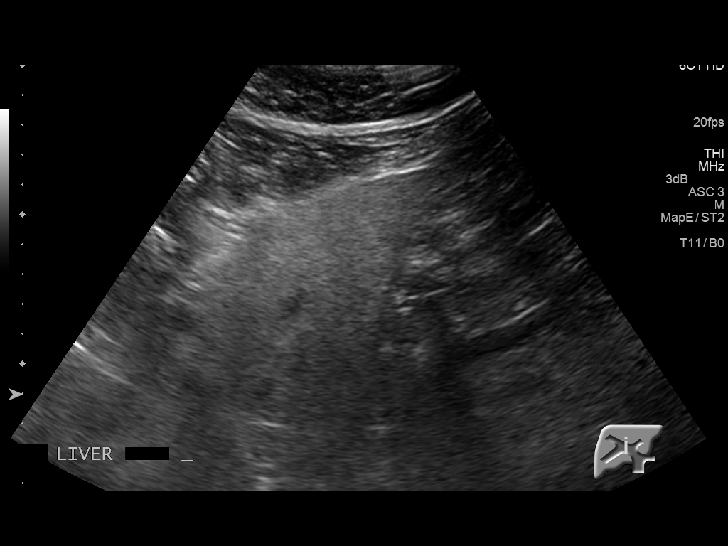
[im 39/117]
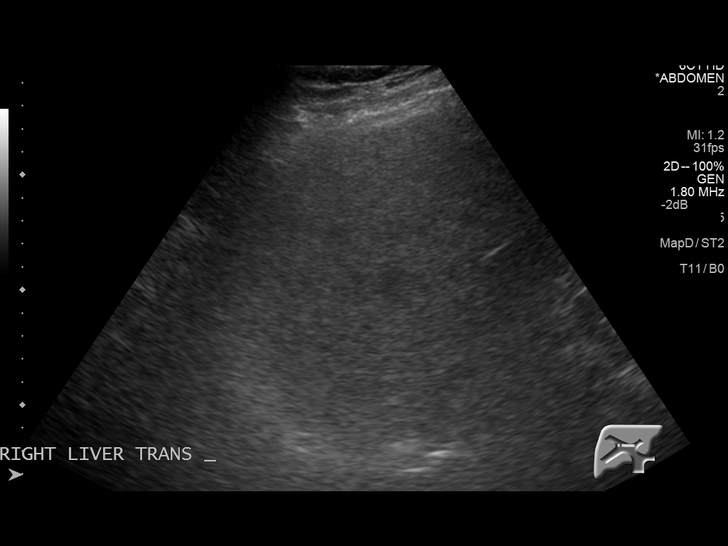
[im 44/117]
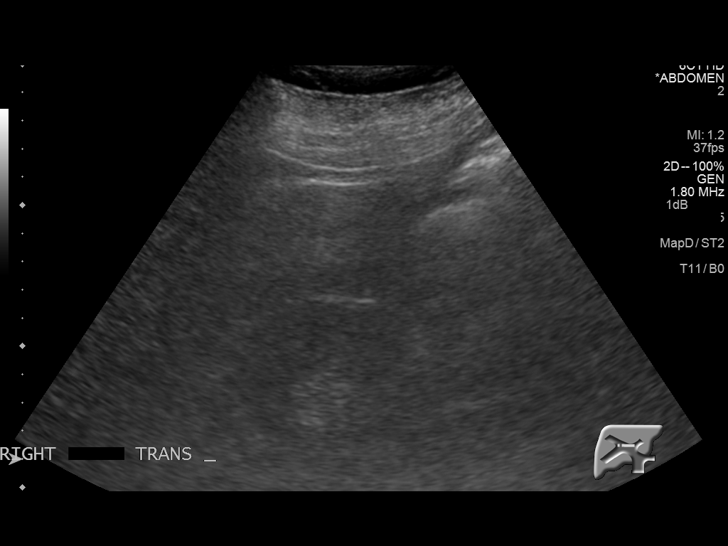
[im 54/117]
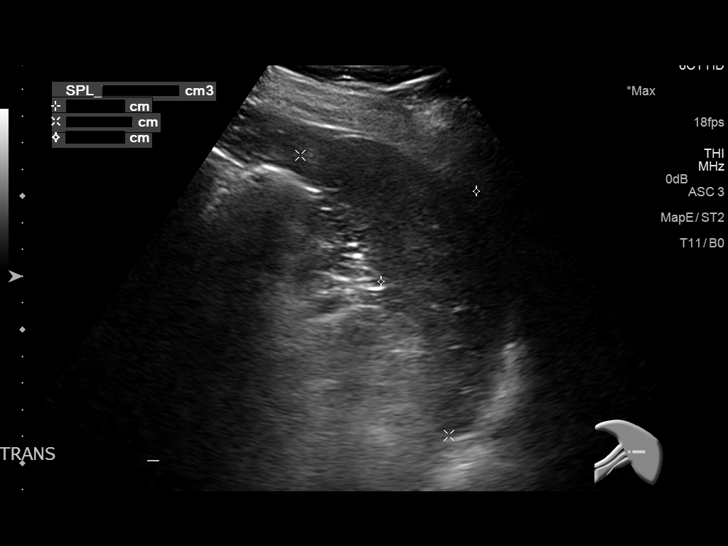
[im 63/117]
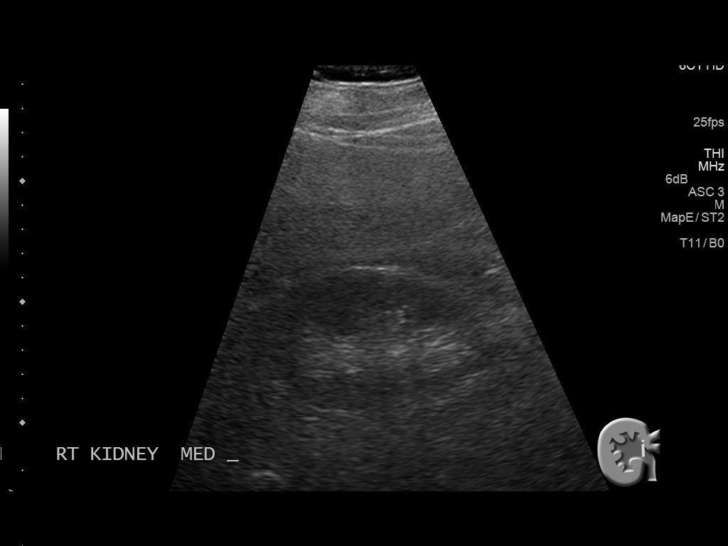
[im 73/117]
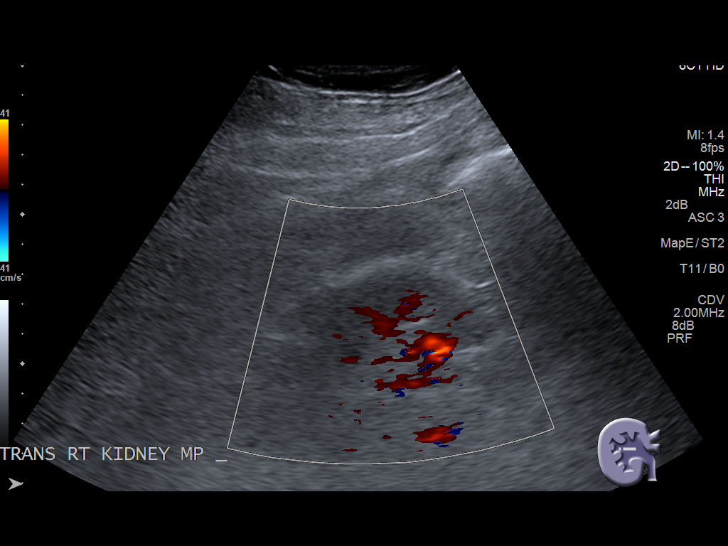
[im 78/117]
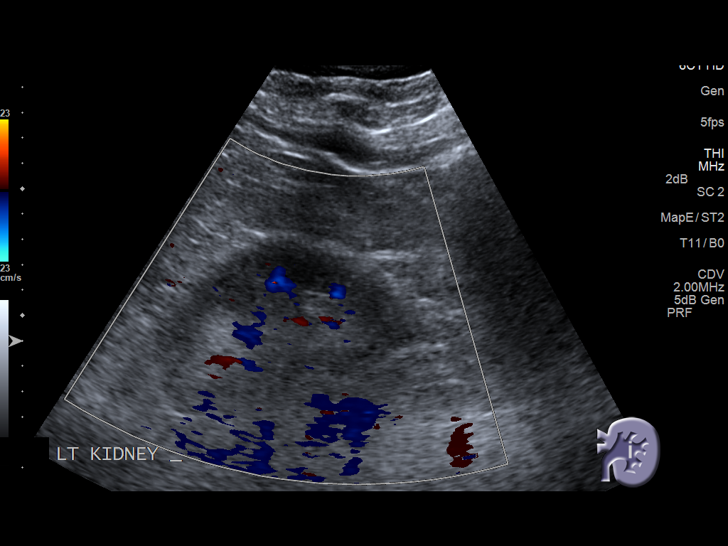
[im 88/117]
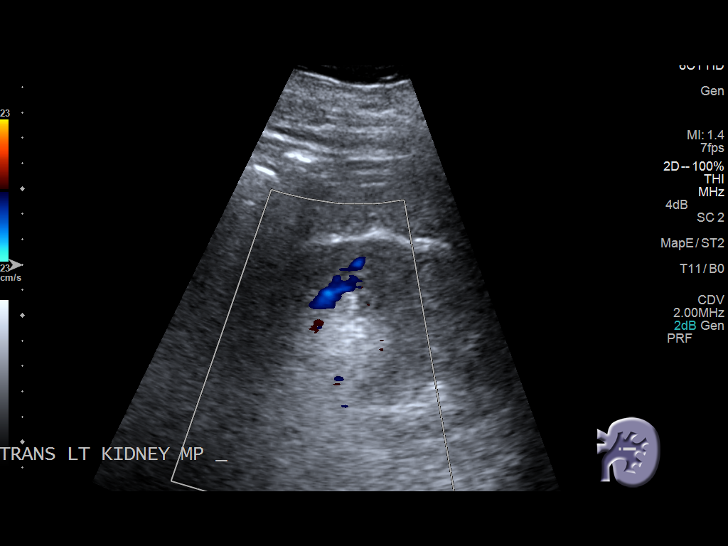
[im 97/117]
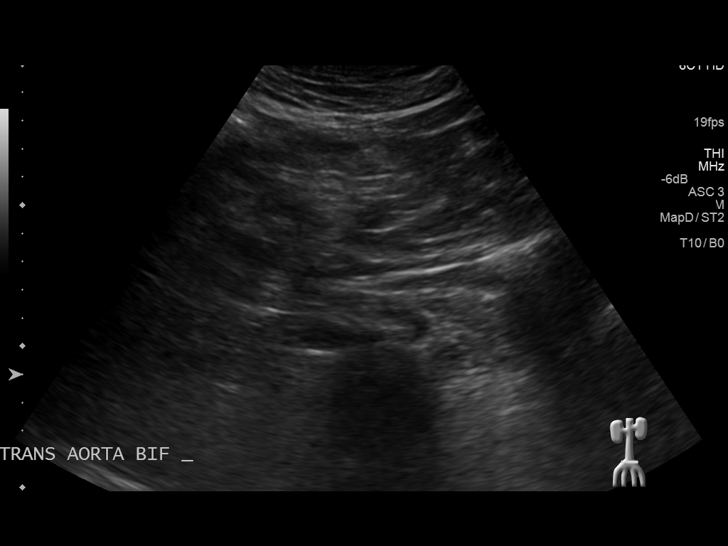
[im 107/117]
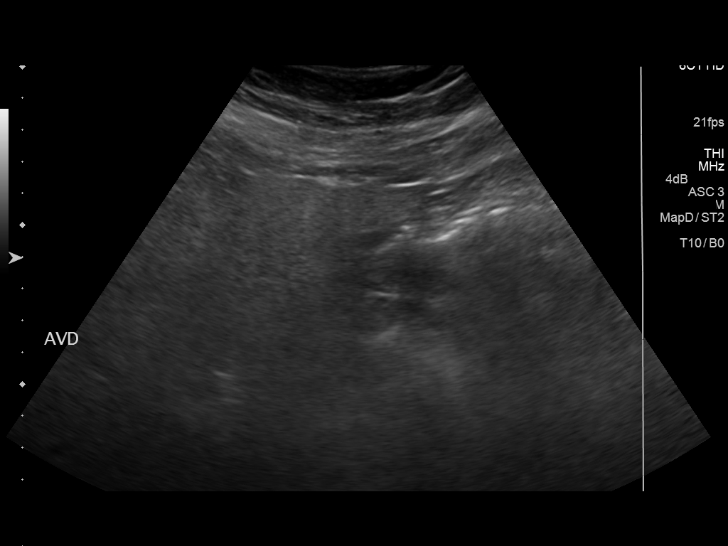
[im 117/117]
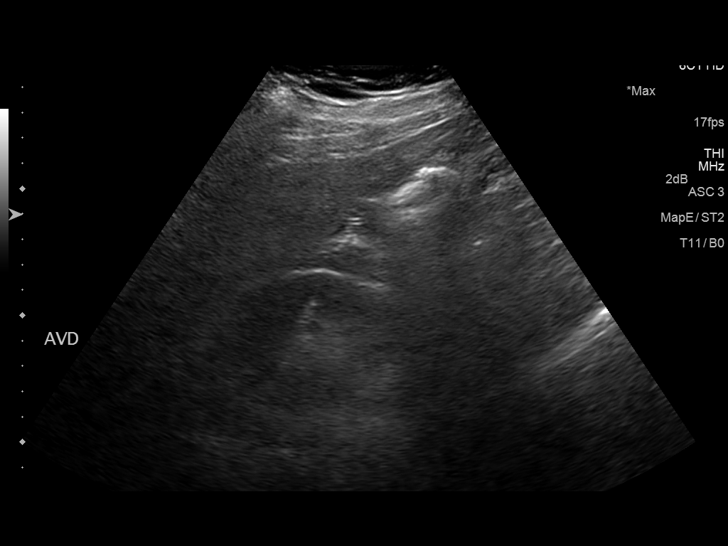

[14 of 25 positions shown; findings below may reference images not displayed]

FINDINGS: Gallbladder: Multiple gallstones. Gallbladder wall 2.8 mm. Negative
sonographic Murphy sign. Largest gallstone measures 12 mm.

Common bile duct: Diameter: 5.3 mm

Liver: Marked echogenicity of the liver diffusely compatible with
fatty infiltration/chronic liver disease. No focal liver lesion.
Liver is difficult to image.

IVC: No abnormality visualized.

Pancreas: Limited

Spleen: 7.8 cm.  Negative for splenomegaly.

Right Kidney: Length: 12.1 cm. Echogenicity within normal limits. No
mass or hydronephrosis visualized.

Left Kidney: Length: 12.1 cm. Echogenicity within normal limits. No
mass or hydronephrosis visualized.

Abdominal aorta: No aneurysm visualized.

Other findings: None.
IMPRESSION: Cholelithiasis

Marked echogenicity of the liver without focal liver lesion.
Negative for splenomegaly.

## 2015-12-27 DIAGNOSIS — D472 Monoclonal gammopathy: Secondary | ICD-10-CM | POA: Diagnosis not present

## 2015-12-28 DIAGNOSIS — D472 Monoclonal gammopathy: Secondary | ICD-10-CM | POA: Diagnosis not present

## 2015-12-29 ENCOUNTER — Inpatient Hospital Stay (HOSPITAL_BASED_OUTPATIENT_CLINIC_OR_DEPARTMENT_OTHER): Payer: Medicare Other | Admitting: Hematology and Oncology

## 2015-12-29 ENCOUNTER — Inpatient Hospital Stay: Payer: Medicare Other

## 2015-12-29 ENCOUNTER — Inpatient Hospital Stay: Payer: Medicare Other | Attending: Hematology and Oncology

## 2015-12-29 ENCOUNTER — Encounter: Payer: Self-pay | Admitting: Hematology and Oncology

## 2015-12-29 VITALS — BP 139/63 | HR 84 | Temp 95.9°F | Resp 18 | Ht 67.0 in | Wt 217.5 lb

## 2015-12-29 DIAGNOSIS — Z79899 Other long term (current) drug therapy: Secondary | ICD-10-CM | POA: Diagnosis not present

## 2015-12-29 DIAGNOSIS — Z882 Allergy status to sulfonamides status: Secondary | ICD-10-CM | POA: Diagnosis not present

## 2015-12-29 DIAGNOSIS — C9000 Multiple myeloma not having achieved remission: Secondary | ICD-10-CM

## 2015-12-29 DIAGNOSIS — E119 Type 2 diabetes mellitus without complications: Secondary | ICD-10-CM | POA: Insufficient documentation

## 2015-12-29 DIAGNOSIS — Z8619 Personal history of other infectious and parasitic diseases: Secondary | ICD-10-CM | POA: Insufficient documentation

## 2015-12-29 DIAGNOSIS — Z87891 Personal history of nicotine dependence: Secondary | ICD-10-CM

## 2015-12-29 DIAGNOSIS — E785 Hyperlipidemia, unspecified: Secondary | ICD-10-CM | POA: Insufficient documentation

## 2015-12-29 DIAGNOSIS — Z86010 Personal history of colonic polyps: Secondary | ICD-10-CM | POA: Diagnosis not present

## 2015-12-29 DIAGNOSIS — C9141 Hairy cell leukemia, in remission: Secondary | ICD-10-CM | POA: Insufficient documentation

## 2015-12-29 DIAGNOSIS — Z8701 Personal history of pneumonia (recurrent): Secondary | ICD-10-CM

## 2015-12-29 DIAGNOSIS — K5790 Diverticulosis of intestine, part unspecified, without perforation or abscess without bleeding: Secondary | ICD-10-CM | POA: Insufficient documentation

## 2015-12-29 DIAGNOSIS — K219 Gastro-esophageal reflux disease without esophagitis: Secondary | ICD-10-CM | POA: Insufficient documentation

## 2015-12-29 DIAGNOSIS — N400 Enlarged prostate without lower urinary tract symptoms: Secondary | ICD-10-CM

## 2015-12-29 DIAGNOSIS — R5383 Other fatigue: Secondary | ICD-10-CM | POA: Diagnosis not present

## 2015-12-29 DIAGNOSIS — R0602 Shortness of breath: Secondary | ICD-10-CM | POA: Insufficient documentation

## 2015-12-29 DIAGNOSIS — E538 Deficiency of other specified B group vitamins: Secondary | ICD-10-CM | POA: Insufficient documentation

## 2015-12-29 DIAGNOSIS — Z808 Family history of malignant neoplasm of other organs or systems: Secondary | ICD-10-CM

## 2015-12-29 DIAGNOSIS — I10 Essential (primary) hypertension: Secondary | ICD-10-CM | POA: Insufficient documentation

## 2015-12-29 DIAGNOSIS — D46Z Other myelodysplastic syndromes: Secondary | ICD-10-CM | POA: Insufficient documentation

## 2015-12-29 DIAGNOSIS — Z7984 Long term (current) use of oral hypoglycemic drugs: Secondary | ICD-10-CM

## 2015-12-29 DIAGNOSIS — Z7982 Long term (current) use of aspirin: Secondary | ICD-10-CM | POA: Insufficient documentation

## 2015-12-29 DIAGNOSIS — Z95828 Presence of other vascular implants and grafts: Secondary | ICD-10-CM

## 2015-12-29 DIAGNOSIS — Z85828 Personal history of other malignant neoplasm of skin: Secondary | ICD-10-CM | POA: Insufficient documentation

## 2015-12-29 LAB — CBC WITH DIFFERENTIAL/PLATELET
Basophils Absolute: 0 10*3/uL (ref 0–0.1)
Basophils Relative: 1 %
Eosinophils Absolute: 0.1 10*3/uL (ref 0–0.7)
Eosinophils Relative: 4 %
HCT: 25.5 % — ABNORMAL LOW (ref 40.0–52.0)
Hemoglobin: 9 g/dL — ABNORMAL LOW (ref 13.0–18.0)
Lymphocytes Relative: 24 %
Lymphs Abs: 0.5 10*3/uL — ABNORMAL LOW (ref 1.0–3.6)
MCH: 38.9 pg — ABNORMAL HIGH (ref 26.0–34.0)
MCHC: 35.2 g/dL (ref 32.0–36.0)
MCV: 110.5 fL — ABNORMAL HIGH (ref 80.0–100.0)
Monocytes Absolute: 0.2 10*3/uL (ref 0.2–1.0)
Monocytes Relative: 7 %
Neutro Abs: 1.4 10*3/uL (ref 1.4–6.5)
Neutrophils Relative %: 64 %
Platelets: 90 10*3/uL — ABNORMAL LOW (ref 150–440)
RBC: 2.31 MIL/uL — ABNORMAL LOW (ref 4.40–5.90)
RDW: 15.3 % — ABNORMAL HIGH (ref 11.5–14.5)
WBC: 2.2 10*3/uL — ABNORMAL LOW (ref 3.8–10.6)

## 2015-12-29 LAB — DAT, POLYSPECIFIC AHG (ARMC ONLY): Polyspecific AHG test: NEGATIVE

## 2015-12-29 LAB — RETICULOCYTES
RBC.: 2.31 MIL/uL — ABNORMAL LOW (ref 4.40–5.90)
Retic Count, Absolute: 76.2 10*3/uL (ref 19.0–183.0)
Retic Ct Pct: 3.3 % — ABNORMAL HIGH (ref 0.4–3.1)

## 2015-12-29 LAB — LACTATE DEHYDROGENASE: LDH: 167 U/L (ref 98–192)

## 2015-12-29 MED ORDER — SODIUM CHLORIDE 0.9% FLUSH
10.0000 mL | INTRAVENOUS | Status: DC | PRN
  Administered 2015-12-29: 10 mL via INTRAVENOUS
  Filled 2015-12-29: qty 10

## 2015-12-29 MED ORDER — HEPARIN SOD (PORK) LOCK FLUSH 100 UNIT/ML IV SOLN
500.0000 [IU] | Freq: Once | INTRAVENOUS | Status: AC
  Administered 2015-12-29: 500 [IU] via INTRAVENOUS

## 2015-12-29 NOTE — Progress Notes (Signed)
Pt reports no changes since last visit  

## 2015-12-29 NOTE — Progress Notes (Signed)
[facility] day:  12/29/2015  Chief Complaint: Alex Smith is a 81 y.o. 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  HPI:  The patient was last seen in the medical oncology clinic on 12/01/2015.  At that time, he noted chronic fatigue.  Exam was stable.  He denied any fevers or infections.  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  During the interim, he describes a viral infection with cough, runny nose, and sore throat.  Symptoms lasted for 3-4 days.  Symptomatically, he feels good.  He continues to work.  He continues to take care of his wife (she needs a new hip).  He needs to have left eye surgery ([facility]ula).   Past Medical History  Diagnosis Date  . Hypertension   . GERD (gastroesophageal reflux disease)   . HLD (hyperlipidemia)   . History of pneumonia 2000's    "once" (07/07/2012)  . Type II diabetes mellitus ([place])   . 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 Basal cell carcinoma of face   . Diverticulosis   . Colon polyps   . BPH (benign prostatic hypertrophy)     followed by urology, discharged (Dr. Bernardo Heater)  . B12 deficiency   . History of shingles   . Shortness of breath dyspnea   . Pneumonia   . CAP (community acquired pneumonia) 02/15/2015    Past Surgical History  Procedure Laterality Date  . Eye surgery Left 06/2012    laser surgery  . Cataract extraction w/ intraocular lens  implant, bilateral  ~ 2010  . Skin cancer excision      "all over my face" (07/07/2012)  . 25 gauge pars plana vitrectomy with 20 gauge mvr port for macular hole  07/07/2012    Procedure: 25 GAUGE PARS PLANA VITRECTOMY WITH 20 GAUGE MVR PORT FOR MACULAR HOLE;  Surgeon: Hayden Pedro, MD;  Location: [facility];  Service: Ophthalmology;  Laterality: Left;  . Serum patch  07/07/2012    Procedure: SERUM PATCH;  Surgeon: Hayden Pedro, MD;  Location: [facility];   Service: Ophthalmology;  Laterality: Left;  . Gas insertion  07/07/2012    Procedure: INSERTION OF GAS;  Surgeon: Hayden Pedro, MD;  Location: [facility];  Service: Ophthalmology;  Laterality: Left;  [id number]  . Cardiovascular stress test  2013    treadmill - no evidence ischemia, EF 61%  . Colonoscopy  2014    [place] WNL no rpt needed, h/o polyps  . Peripheral vascular catheterization N/A 02/23/2015    Procedure: Glori Luis Cath Insertion;  Surgeon: Algernon Huxley, MD;  Location: [facility];  Service: Cardiovascular;  Laterality: N/A;  . Bone marrow biopsy  2016    Family History  Problem Relation Age of Onset  . Dementia Mother   . Heart failure Father 69  . Cancer Sister     breast  . Diabetes Paternal Uncle   . Diabetes Paternal Aunt   . CAD Brother 14    MI  . Stroke Neg Hx     Social History:  reports that he quit smoking about 46 years ago. His smoking use included Cigarettes. He has a 50 pack-year smoking history. He has never used smokeless tobacco. He reports that he drinks alcohol. He reports that he does not use illicit drugs.  He works at [employer] 20-32 hours/week.  The patient '  s wife has had 3 hip operations.  He takes care of his wife.  He states that they either eat out or bring food in.  He is alone today.  Allergies:  Allergies  Allergen Reactions  . Rituximab Rash    Chest tightness  . Blood-Group Specific Substance Other (See Comments)    Had a post transfusion reaction of red blood cells; NOW REQUIRES WASHED BLOOD CELLS  . Primaxin [Imipenem] Other (See Comments)    Possible allergy  . Sulfa Antibiotics Itching and Rash  Possible allergy to Zoloft, allopurinol, and a chemotherapy medication.  He tolerates Dapsone.  Current Medications: Current Outpatient Prescriptions  Medication Sig Dispense Refill  . acyclovir (ZOVIRAX) 400 MG tablet TAKE TWO TABLETS TWICE DAILY 120 tablet 3  . aspirin EC 81 MG tablet Take 81 mg by mouth daily.    Marland Kitchen glucose blood  (ACCU-CHEK AVIVA PLUS) test strip Use to check sugar twice daily Dx:E11.9 200 each 3  . HUMULIN R 100 UNIT/ML injection INJCET 10 UNITS SUBCUTANEOUSLY TWICE DAILY BEFORE MEALS 10 mL 11  . hydrochlorothiazide (HYDRODIURIL) 25 MG tablet TAKE ONE TABLET EVERY DAY 30 tablet 11  . insulin NPH Human (HUMULIN N) 100 UNIT/ML injection Inject 0.35 mLs (35 Units total) into the skin 2 (two) times daily before a meal. 20 mL 11  . lidocaine-prilocaine (EMLA) cream Apply 1 application topically as needed. 30 g 3  . lisinopril (PRINIVIL,ZESTRIL) 20 MG tablet TAKE ONE TABLET BY MOUTH TWICE DAILY 60 tablet 6  . metFORMIN (GLUCOPHAGE) 1000 MG tablet TAKE ONE TABLET BY MOUTH TWICE DAILY AS DIRECTED 180 tablet 3  . MICROLET LANCETS MISC USE AS DIRECTED 200 each 3  . pravastatin (PRAVACHOL) 20 MG tablet Take 1 tablet (20 mg total) by mouth at bedtime. 90 tablet 2  . ranitidine (ZANTAC) 150 MG tablet Take 1 tablet (150 mg total) by mouth 2 (two) times daily as needed. 180 tablet 3  . tamsulosin (FLOMAX) 0.4 MG CAPS capsule Take 0.4 mg by mouth daily.    . temazepam (RESTORIL) 7.5 MG capsule Take by mouth.    . traZODone (DESYREL) 50 MG tablet Take 0.5-1 tablets (25-50 mg total) by mouth at bedtime as needed for sleep. 30 tablet 3  . vitamin B-12 (CYANOCOBALAMIN) 1000 MCG tablet Take 1,000 mcg by mouth daily.    . vitamin E 400 UNIT capsule Take 200 Units by mouth daily.      No current facility-administered medications for this visit.   Facility-Administered Medications Ordered in Other Visits  Medication Dose Route Frequency Provider Last Rate Last Dose  . heparin lock flush 100 unit/mL  500 Units Intravenous Once Lequita Asal, MD      . sodium chloride 0.9 % injection 10 mL  10 mL Intravenous PRN Lequita Asal, MD   10 mL at 03/03/15 0903  . sodium chloride 0.9 % injection 10 mL  10 mL Intracatheter PRN Lequita Asal, MD   10 mL at 03/10/15 1410  . sodium chloride flush (NS) 0.9 % injection 10 mL   10 mL Intravenous PRN Lequita Asal, MD   10 mL at 12/29/15 1003   Review of Systems:  GENERAL:  Fatigue. No fevers or sweats.  Weight down 1pound. PERFORMANCE STATUS (ECOG):  1 HEENT:  Interval URI.  No visual changes, runny nose, sore throat. Pulmonary:  Shortness of breath with exertion.  No hemoptysis. Cardiac:  No chest pain, palpitations, orthopnea, or PND. GI:  Appetite 75%.  No nausea, vomiting, diarrhea, constipation, melena or hematochezia. GU:  No urgency, frequency, dysuria, or hematuria. Musculoskeletal:  No back pain.  No joint pain.  No muscle tenderness. Extremities:  No pain or swelling. Skin:  No rashes or skin changes. Neuro:  No headache, numbness or weakness, balance or coordination issues. Endocrine:  Diabetes.  No thyroid issues, hot flashes or night sweats. Psych:  No mood changes, depression or anxiety.  Poor sleep. Pain:  No focal pain. Review of systems:  All other systems reviewed and found to be negative.   Physical Exam: Blood pressure 139/63, pulse 84, temperature 95.9 F (35.5 C), temperature source Tympanic, resp. rate 18, height 5' 7"  (1.702 m), weight 217 lb 7.7 oz (98.65 kg).  GENERAL:  Well developed, well nourished, gentleman sitting comfortably in the exam room in no acute distress. MENTAL STATUS:  Alert and oriented to person, place and time. HEAD:  Pearline Cables short hair.  Normocephalic, atraumatic, face symmetric, no Cushingoid features. EYES:  Glasses. Blue eyes.  Pupils equal round and reactive to light and accomodation.  No conjunctivitis or scleral icterus. ENT:  Oropharynx clear without lesion.  Tongue normal. Mucous membranes moist.  RESPIRATORY:  Clear to auscultation without rales, wheezes or rhonchi. CARDIOVASCULAR:  Regular rate and rhythm without murmur, rub or gallop. ABDOMEN:  Soft, non-tender, with active bowel sounds, and no hepatosplenomegaly.  No masses. SKIN:  No rashes, ulcers or lesions. EXTREMITIES: No edema, no skin  discoloration or tenderness.  No palpable cords. LYMPH NODES: No palpable cervical, supraclavicular, axillary or inguinal adenopathy  NEUROLOGICAL: Unremarkable. PSYCH:  Appropriate.   Appointment on 12/29/2015  Component Date Value Ref Range Status  . WBC 12/29/2015 2.2* 3.8 - 10.6 K/uL Final  . RBC 12/29/2015 2.31* 4.40 - 5.90 MIL/uL Final  . Hemoglobin 12/29/2015 9.0* 13.0 - 18.0 g/dL Final  . HCT 12/29/2015 25.5* 40.0 - 52.0 % Final  . MCV 12/29/2015 110.5* 80.0 - 100.0 fL Final  . MCH 12/29/2015 38.9* 26.0 - 34.0 pg Final  . MCHC 12/29/2015 35.2  32.0 - 36.0 g/dL Final  . RDW 12/29/2015 15.3* 11.5 - 14.5 % Final  . Platelets 12/29/2015 90* 150 - 440 K/uL Final  . Neutrophils Relative % 12/29/2015 64   Final  . Neutro Abs 12/29/2015 1.4  1.4 - 6.5 K/uL Final  . Lymphocytes Relative 12/29/2015 24   Final  . Lymphs Abs 12/29/2015 0.5* 1.0 - 3.6 K/uL Final  . Monocytes Relative 12/29/2015 7   Final  . Monocytes Absolute 12/29/2015 0.2  0.2 - 1.0 K/uL Final  . Eosinophils Relative 12/29/2015 4   Final  . Eosinophils Absolute 12/29/2015 0.1  0 - 0.7 K/uL Final  . Basophils Relative 12/29/2015 1   Final  . Basophils Absolute 12/29/2015 0.0  0 - 0.1 K/uL Final  . Retic Ct Pct 12/29/2015 3.3* 0.4 - 3.1 % Final  . RBC. 12/29/2015 2.31* 4.40 - 5.90 MIL/uL Final  . Retic Count, Manual 12/29/2015 76.2  19.0 - 183.0 K/uL Final  . LDH 12/29/2015 167  98 - 192 U/L Final    Assessment:  DREQUAN IRONSIDE is a 81 y.o. 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  He was diagnosed with hairy cell leukemia in 2006.  He received cladribine 11/06-11/05/2005.  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   Bone marrow aspirate and biopsy on 02/07/2015 revealed a extensive marrow involvement by recurrent hairy cell leukemia (approximately 80% of cells in the core). There  was a monoclonal plasma cell infiltrate (approximately 10%), compatible with a plasma cell neoplasm.  Marrow was hypercellular (40-50%) with markedly diminished residual trilineage hematopoiesis and mild multilineage dyspoiesis.  Peripheral smear revealed leukopenia (WBC 1900) with 2% atypical lymphoid cells compatible with hairy cell leukemia. There was moderate anemia with anisopoikilocytosis. 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  FISH studies for MDS were negative.  Cytogenetics were normal (46, XY).  He has an unclear allergy history.  He may be allergic to Primaxin, Zoloft, allopurinol, and a chemotherapy medication.  Reaction occurred in 06/2005.  He is allergic to sulfa.  He is not allergic to dapsone.  He requires washed RBCs.  He is on prophylactic acyclovir.  Dapsone and voriconazole were discontinued.  He has a history of shingles prior to prophylactic acyclovir.  He is 10 months status post cladribine (began 03/03/2015).  His counts plateaued on 07/25/2015 and have subsequently decreased.  Labs on 07/25/2015 revealed a normal B12 (894), folate (60.5), and TSH (2.562).  Ferritin was 52.  Bone marrow on 11/03/2015 revealed persistent plasma cell neoplasm, now with mildy atypical monoclonal plasma cells estimated at 20-30%.   There was no morphologic evidence of residual hairy cell leukemia.  There was a minute population (0.03%) with hairy cell immunophenotype detected by flow.  Marrow was normocellular for age with relative erythroid hyperplasia, relative myeloid hypoplasia, mild dyspoiesis, adequate megakaryocytes and no increased blasts.  There was no significant increase in marrow reticulin fibers.  Iron was present.  FISH studies for MDS were negative.  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  Cytogenetics were normal (46, XY).  SPEP on 11/17/2015 revealed a 1.3 gm/dL monoclonal spike (1.4 gm/dL on 07/25/2015 and 1.0 on 02/03/2015).  Kappa free light chains were 50.51,  lambda free light chains were 10.48 with a ratio of 4.82 (ratio 3.68 on 07/25/2015).  Beta2-microglobulin was 2.5.  24 hour urine on 11/21/2015 revealed 234.8 mg protein in 24 hours (no monoclonal protein).  Bone survey on 11/24/2015 revealed no lytic lesions.  Symptomatically, he has chronic fatigue.  He had an interval viral infection.  Exam is stable.  Counts are slowly drifting down.  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  Plan: 1.  Labs today:  CBC with diff, LDH, retic, [place]. 2.  RTC in 4 weeks for CBC with diff. 3.  Continue port flush every 6-8 weeks. 4.  Patient to follow-up with Dr. [name] at [facility] on 02/05/2016. 5.  Copy of counts today for patient.  Patient to discuss with opthalmologist his counts prior to surgery. 6.  RTC in 2 months for MD assessment, labs (CBC with diff), and port flush.    Lequita Asal, MD  12/29/2015, 10:45 AM

## 2016-01-22 ENCOUNTER — Ambulatory Visit (INDEPENDENT_AMBULATORY_CARE_PROVIDER_SITE_OTHER): Payer: Medicare Other | Admitting: Ophthalmology

## 2016-01-22 DIAGNOSIS — H35033 Hypertensive retinopathy, bilateral: Secondary | ICD-10-CM | POA: Diagnosis not present

## 2016-01-22 DIAGNOSIS — E113293 Type 2 diabetes mellitus with mild nonproliferative diabetic retinopathy without macular edema, bilateral: Secondary | ICD-10-CM | POA: Diagnosis not present

## 2016-01-22 DIAGNOSIS — H353111 Nonexudative age-related macular degeneration, right eye, early dry stage: Secondary | ICD-10-CM

## 2016-01-22 DIAGNOSIS — H353122 Nonexudative age-related macular degeneration, left eye, intermediate dry stage: Secondary | ICD-10-CM

## 2016-01-22 DIAGNOSIS — H35342 Macular cyst, hole, or pseudohole, left eye: Secondary | ICD-10-CM

## 2016-01-22 DIAGNOSIS — E11319 Type 2 diabetes mellitus with unspecified diabetic retinopathy without macular edema: Secondary | ICD-10-CM | POA: Diagnosis not present

## 2016-01-22 DIAGNOSIS — I10 Essential (primary) hypertension: Secondary | ICD-10-CM

## 2016-01-22 DIAGNOSIS — H43811 Vitreous degeneration, right eye: Secondary | ICD-10-CM

## 2016-01-22 LAB — HM DIABETES EYE EXAM

## 2016-01-24 ENCOUNTER — Ambulatory Visit (INDEPENDENT_AMBULATORY_CARE_PROVIDER_SITE_OTHER): Payer: Self-pay | Admitting: Ophthalmology

## 2016-01-26 ENCOUNTER — Encounter: Payer: Self-pay | Admitting: Family Medicine

## 2016-01-29 ENCOUNTER — Inpatient Hospital Stay: Payer: Medicare Other | Attending: Hematology and Oncology

## 2016-01-29 DIAGNOSIS — C9141 Hairy cell leukemia, in remission: Secondary | ICD-10-CM

## 2016-01-29 DIAGNOSIS — C9140 Hairy cell leukemia not having achieved remission: Secondary | ICD-10-CM | POA: Diagnosis not present

## 2016-01-29 DIAGNOSIS — C9000 Multiple myeloma not having achieved remission: Secondary | ICD-10-CM

## 2016-01-29 DIAGNOSIS — D46Z Other myelodysplastic syndromes: Secondary | ICD-10-CM

## 2016-01-29 LAB — CBC WITH DIFFERENTIAL/PLATELET
Basophils Absolute: 0 10*3/uL (ref 0–0.1)
Basophils Relative: 1 %
Eosinophils Absolute: 0.1 10*3/uL (ref 0–0.7)
Eosinophils Relative: 6 %
HCT: 25 % — ABNORMAL LOW (ref 40.0–52.0)
Hemoglobin: 8.9 g/dL — ABNORMAL LOW (ref 13.0–18.0)
Lymphocytes Relative: 22 %
Lymphs Abs: 0.6 10*3/uL — ABNORMAL LOW (ref 1.0–3.6)
MCH: 39.5 pg — ABNORMAL HIGH (ref 26.0–34.0)
MCHC: 35.4 g/dL (ref 32.0–36.0)
MCV: 111.7 fL — ABNORMAL HIGH (ref 80.0–100.0)
Monocytes Absolute: 0.2 10*3/uL (ref 0.2–1.0)
Monocytes Relative: 7 %
Neutro Abs: 1.6 10*3/uL (ref 1.4–6.5)
Neutrophils Relative %: 64 %
Platelets: 90 10*3/uL — ABNORMAL LOW (ref 150–440)
RBC: 2.24 MIL/uL — ABNORMAL LOW (ref 4.40–5.90)
RDW: 15.6 % — ABNORMAL HIGH (ref 11.5–14.5)
WBC: 2.5 10*3/uL — ABNORMAL LOW (ref 3.8–10.6)

## 2016-02-05 ENCOUNTER — Other Ambulatory Visit: Payer: Self-pay | Admitting: Family Medicine

## 2016-02-05 DIAGNOSIS — C9000 Multiple myeloma not having achieved remission: Secondary | ICD-10-CM | POA: Insufficient documentation

## 2016-02-05 DIAGNOSIS — Z87891 Personal history of nicotine dependence: Secondary | ICD-10-CM | POA: Diagnosis not present

## 2016-02-05 DIAGNOSIS — Z7984 Long term (current) use of oral hypoglycemic drugs: Secondary | ICD-10-CM | POA: Diagnosis not present

## 2016-02-05 DIAGNOSIS — D469 Myelodysplastic syndrome, unspecified: Secondary | ICD-10-CM | POA: Diagnosis not present

## 2016-02-05 DIAGNOSIS — D472 Monoclonal gammopathy: Secondary | ICD-10-CM | POA: Diagnosis not present

## 2016-02-05 DIAGNOSIS — D649 Anemia, unspecified: Secondary | ICD-10-CM | POA: Diagnosis not present

## 2016-02-05 DIAGNOSIS — E119 Type 2 diabetes mellitus without complications: Secondary | ICD-10-CM | POA: Diagnosis not present

## 2016-02-05 DIAGNOSIS — I10 Essential (primary) hypertension: Secondary | ICD-10-CM | POA: Diagnosis not present

## 2016-02-05 DIAGNOSIS — D4620 Refractory anemia with excess of blasts, unspecified: Secondary | ICD-10-CM | POA: Insufficient documentation

## 2016-02-05 DIAGNOSIS — C9141 Hairy cell leukemia, in remission: Secondary | ICD-10-CM | POA: Diagnosis not present

## 2016-02-05 DIAGNOSIS — D539 Nutritional anemia, unspecified: Secondary | ICD-10-CM | POA: Diagnosis not present

## 2016-02-05 DIAGNOSIS — R5383 Other fatigue: Secondary | ICD-10-CM | POA: Diagnosis not present

## 2016-02-28 ENCOUNTER — Other Ambulatory Visit: Payer: Self-pay | Admitting: Hematology and Oncology

## 2016-02-29 ENCOUNTER — Inpatient Hospital Stay: Payer: Medicare Other

## 2016-02-29 ENCOUNTER — Encounter: Payer: Self-pay | Admitting: Hematology and Oncology

## 2016-02-29 ENCOUNTER — Inpatient Hospital Stay: Payer: Medicare Other | Attending: Hematology and Oncology | Admitting: Hematology and Oncology

## 2016-02-29 ENCOUNTER — Telehealth: Payer: Self-pay | Admitting: *Deleted

## 2016-02-29 VITALS — BP 148/72 | HR 72 | Temp 97.8°F | Resp 18 | Wt 215.8 lb

## 2016-02-29 DIAGNOSIS — Z87891 Personal history of nicotine dependence: Secondary | ICD-10-CM | POA: Insufficient documentation

## 2016-02-29 DIAGNOSIS — R0602 Shortness of breath: Secondary | ICD-10-CM | POA: Insufficient documentation

## 2016-02-29 DIAGNOSIS — Z8619 Personal history of other infectious and parasitic diseases: Secondary | ICD-10-CM | POA: Diagnosis not present

## 2016-02-29 DIAGNOSIS — R5383 Other fatigue: Secondary | ICD-10-CM

## 2016-02-29 DIAGNOSIS — E538 Deficiency of other specified B group vitamins: Secondary | ICD-10-CM | POA: Diagnosis not present

## 2016-02-29 DIAGNOSIS — Z86010 Personal history of colonic polyps: Secondary | ICD-10-CM | POA: Insufficient documentation

## 2016-02-29 DIAGNOSIS — K5790 Diverticulosis of intestine, part unspecified, without perforation or abscess without bleeding: Secondary | ICD-10-CM | POA: Diagnosis not present

## 2016-02-29 DIAGNOSIS — E785 Hyperlipidemia, unspecified: Secondary | ICD-10-CM | POA: Diagnosis not present

## 2016-02-29 DIAGNOSIS — Z7984 Long term (current) use of oral hypoglycemic drugs: Secondary | ICD-10-CM

## 2016-02-29 DIAGNOSIS — Z7982 Long term (current) use of aspirin: Secondary | ICD-10-CM | POA: Diagnosis not present

## 2016-02-29 DIAGNOSIS — Z803 Family history of malignant neoplasm of breast: Secondary | ICD-10-CM

## 2016-02-29 DIAGNOSIS — C9141 Hairy cell leukemia, in remission: Secondary | ICD-10-CM

## 2016-02-29 DIAGNOSIS — I10 Essential (primary) hypertension: Secondary | ICD-10-CM | POA: Insufficient documentation

## 2016-02-29 DIAGNOSIS — K219 Gastro-esophageal reflux disease without esophagitis: Secondary | ICD-10-CM | POA: Diagnosis not present

## 2016-02-29 DIAGNOSIS — Z801 Family history of malignant neoplasm of trachea, bronchus and lung: Secondary | ICD-10-CM | POA: Diagnosis not present

## 2016-02-29 DIAGNOSIS — D46Z Other myelodysplastic syndromes: Secondary | ICD-10-CM

## 2016-02-29 DIAGNOSIS — Z85828 Personal history of other malignant neoplasm of skin: Secondary | ICD-10-CM | POA: Insufficient documentation

## 2016-02-29 DIAGNOSIS — N400 Enlarged prostate without lower urinary tract symptoms: Secondary | ICD-10-CM | POA: Diagnosis not present

## 2016-02-29 DIAGNOSIS — C9000 Multiple myeloma not having achieved remission: Secondary | ICD-10-CM | POA: Diagnosis not present

## 2016-02-29 DIAGNOSIS — E11319 Type 2 diabetes mellitus with unspecified diabetic retinopathy without macular edema: Secondary | ICD-10-CM | POA: Diagnosis not present

## 2016-02-29 DIAGNOSIS — Z95828 Presence of other vascular implants and grafts: Secondary | ICD-10-CM

## 2016-02-29 LAB — CBC WITH DIFFERENTIAL/PLATELET
Basophils Absolute: 0 10*3/uL (ref 0–0.1)
Basophils Relative: 1 %
Eosinophils Absolute: 0.1 10*3/uL (ref 0–0.7)
Eosinophils Relative: 6 %
HCT: 24.6 % — ABNORMAL LOW (ref 40.0–52.0)
Hemoglobin: 8.7 g/dL — ABNORMAL LOW (ref 13.0–18.0)
Lymphocytes Relative: 21 %
Lymphs Abs: 0.5 10*3/uL — ABNORMAL LOW (ref 1.0–3.6)
MCH: 39.1 pg — ABNORMAL HIGH (ref 26.0–34.0)
MCHC: 35.4 g/dL (ref 32.0–36.0)
MCV: 110.4 fL — ABNORMAL HIGH (ref 80.0–100.0)
Monocytes Absolute: 0.2 10*3/uL (ref 0.2–1.0)
Monocytes Relative: 7 %
Neutro Abs: 1.6 10*3/uL (ref 1.4–6.5)
Neutrophils Relative %: 65 %
Platelets: 94 10*3/uL — ABNORMAL LOW (ref 150–440)
RBC: 2.23 MIL/uL — ABNORMAL LOW (ref 4.40–5.90)
RDW: 15.3 % — ABNORMAL HIGH (ref 11.5–14.5)
WBC: 2.4 10*3/uL — ABNORMAL LOW (ref 3.8–10.6)

## 2016-02-29 MED ORDER — HEPARIN SOD (PORK) LOCK FLUSH 100 UNIT/ML IV SOLN
500.0000 [IU] | Freq: Once | INTRAVENOUS | Status: AC
  Administered 2016-02-29: 500 [IU] via INTRAVENOUS

## 2016-02-29 MED ORDER — SODIUM CHLORIDE 0.9% FLUSH
10.0000 mL | INTRAVENOUS | Status: DC | PRN
  Administered 2016-02-29: 10 mL via INTRAVENOUS
  Filled 2016-02-29: qty 10

## 2016-02-29 NOTE — Progress Notes (Signed)
Patient is here for a follow up, he has no complaints or concerns today. Overall doing well

## 2016-02-29 NOTE — Telephone Encounter (Signed)
Called pet dept about doing pet scan and bone marrow on the same day is what pt prefers.  Called pet scan dept and they said he needs to have pet first because it will light of bright at [facility] site if that is done first.  They can do early am 7:30 appt for pet and it should be no longer than 2 hours.  Called and spoke to [place] and she states that both can't be done on same day. I asked why and she states that with ct machine issues the BM bx have tobe done no later than 10 am and pet scans take 3-4 hours.  I told her that I checked with PET and it only takes 2 hours max and he could have 7:30 appt.  She states that it is not as simple as I make it. It takes 1 hour prior to bx. To get bx done which if pt has scan and finishes at 9:30 then the soonest it could be done 10:30 and that is past the time parameters.  She will look into it and call me back.  She then called back and said just ask for it and see if they will open a spot for it.

## 2016-02-29 NOTE — Progress Notes (Signed)
[facility] day:  02/29/2016  Chief Complaint: Alex Smith is a 81 y.o. 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  HPI:  The patient was last seen in the medical oncology clinic on 12/29/2015.  At that time, he felt good.  He was fatigued from taking care of his wife and working.  CBC revealed a hematocrit 25.5, hemoglobin 9.0, MCV 110.5, platelets 90,000, white count 2200 with an ANC of 1400.  He met with [name] on 02/05/2016. It was felt that he would benefit from a PET scan to assess for lytic lesions and adenopathy. He was also suggested that he have a bone marrow to assess evolution of his known disease processes. Consideration was made for initiation of Revlimid for both his myeloma and MDS.  Symptomatically, he feels that he can "sit down and not do anything".  He continues to work 20 hours/week and take care of his wife. She is scheduled for hip replacement on 03/06/2016.    Past Medical History  Diagnosis Date  . Hypertension   . GERD (gastroesophageal reflux disease)   . HLD (hyperlipidemia)   . History of pneumonia 2000's    "once" (07/07/2012)  . Type 2 diabetes, controlled, with retinopathy ([place])   . 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 Basal cell carcinoma of face   . Diverticulosis   . Colon polyps   . BPH (benign prostatic hypertrophy)     followed by urology, discharged (Dr. Bernardo Heater)  . B12 deficiency   . History of shingles   . Shortness of breath dyspnea   . Pneumonia   . CAP (community acquired pneumonia) 02/15/2015    Past Surgical History  Procedure Laterality Date  . Eye surgery Left 06/2012    laser surgery  . Cataract extraction w/ intraocular lens  implant, bilateral  ~ 2010  . Skin cancer excision      "all over my face" (07/07/2012)  . 25 gauge pars plana vitrectomy with 20 gauge mvr port for macular hole   07/07/2012    Procedure: 25 GAUGE PARS PLANA VITRECTOMY WITH 20 GAUGE MVR PORT FOR MACULAR HOLE;  Surgeon: Hayden Pedro, MD;  Location: [place];  Service: Ophthalmology;  Laterality: Left;  . Serum patch  07/07/2012    Procedure: SERUM PATCH;  Surgeon: Hayden Pedro, MD;  Location: [place];  Service: Ophthalmology;  Laterality: Left;  . Gas insertion  07/07/2012    Procedure: INSERTION OF GAS;  Surgeon: Hayden Pedro, MD;  Location: [place];  Service: Ophthalmology;  Laterality: Left;  C3F8  . Cardiovascular stress test  2013    treadmill - no evidence ischemia, EF 61%  . Colonoscopy  2014    [facility] WNL no rpt needed, h/o polyps  . Peripheral vascular catheterization N/A 02/23/2015    Procedure: Glori Luis Cath Insertion;  Surgeon: Algernon Huxley, MD;  Location: [place] [facility];  Service: Cardiovascular;  Laterality: N/A;  . Bone marrow biopsy  2016    Family History  Problem Relation Age of Onset  . Dementia Mother   . Heart failure Father 55  . Cancer Sister     breast  . Diabetes Paternal Uncle   . Diabetes Paternal Aunt   . CAD Brother 89    MI  . Stroke Neg Hx     Social History:  reports  that he quit smoking about 47 years ago. His smoking use included Cigarettes. He has a 50 pack-year smoking history. He has never used smokeless tobacco. He reports that he drinks alcohol. He reports that he does not use illicit drugs.  He works at [employer] 20-32 hours/week.  The patient 's wife has had 3 hip operations.  She is scheduled for hip replacement 03/06/2016.  He takes care of his wife.  He states that they either eat out or bring food in.  He is alone today.  Allergies:  Allergies  Allergen Reactions  . Rituximab Rash    Chest tightness  . Blood-Group Specific Substance Other (See Comments)    Had a post transfusion reaction of red blood cells; NOW REQUIRES WASHED BLOOD CELLS  . Primaxin [Imipenem] Other (See Comments)    Possible allergy  . Sulfa Antibiotics Itching and Rash   Possible allergy to Zoloft, allopurinol, and a chemotherapy medication.  He tolerates Dapsone.  Current Medications: Current Outpatient Prescriptions  Medication Sig Dispense Refill  . acyclovir (ZOVIRAX) 400 MG tablet TAKE 2 TABLETS BY MOUTH TWICE DAILY 120 tablet 3  . aspirin EC 81 MG tablet Take 81 mg by mouth daily.    Marland Kitchen glucose blood (ACCU-CHEK AVIVA PLUS) test strip Use to check sugar twice daily Dx:E11.9 200 each 3  . HUMULIN R 100 UNIT/ML injection INJCET 10 UNITS SUBCUTANEOUSLY TWICE DAILY BEFORE MEALS 10 mL 11  . hydrochlorothiazide (HYDRODIURIL) 25 MG tablet TAKE ONE TABLET EVERY DAY 30 tablet 11  . insulin NPH Human (HUMULIN N) 100 UNIT/ML injection Inject 0.35 mLs (35 Units total) into the skin 2 (two) times daily before a meal. 20 mL 11  . lidocaine-prilocaine (EMLA) cream Apply 1 application topically as needed. 30 g 3  . lisinopril (PRINIVIL,ZESTRIL) 20 MG tablet TAKE ONE TABLET BY MOUTH TWICE DAILY 60 tablet 6  . metFORMIN (GLUCOPHAGE) 1000 MG tablet TAKE ONE TABLET BY MOUTH TWICE DAILY AS DIRECTED 180 tablet 3  . MICROLET LANCETS MISC USE AS DIRECTED 200 each 3  . pravastatin (PRAVACHOL) 20 MG tablet Take 1 tablet (20 mg total) by mouth at bedtime. 90 tablet 2  . ranitidine (ZANTAC) 150 MG tablet Take 1 tablet (150 mg total) by mouth 2 (two) times daily as needed. 180 tablet 3  . tamsulosin (FLOMAX) 0.4 MG CAPS capsule Take 0.4 mg by mouth daily.    . temazepam (RESTORIL) 7.5 MG capsule Take by mouth.    . traZODone (DESYREL) 50 MG tablet Take 0.5-1 tablets (25-50 mg total) by mouth at bedtime as needed for sleep. 30 tablet 3  . vitamin B-12 (CYANOCOBALAMIN) 1000 MCG tablet Take 1,000 mcg by mouth daily.    . vitamin E 400 UNIT capsule Take 200 Units by mouth daily.      No current facility-administered medications for this visit.   Facility-Administered Medications Ordered in Other Visits  Medication Dose Route Frequency Provider Last Rate Last Dose  . heparin lock  flush 100 unit/mL  500 Units Intravenous Once Lequita Asal, MD      . sodium chloride 0.9 % injection 10 mL  10 mL Intravenous PRN Lequita Asal, MD   10 mL at 03/03/15 0903  . sodium chloride 0.9 % injection 10 mL  10 mL Intracatheter PRN Lequita Asal, MD   10 mL at 03/10/15 1410  . sodium chloride flush (NS) 0.9 % injection 10 mL  10 mL Intravenous PRN Lequita Asal, MD   10  mL at 02/29/16 4076   Review of Systems:  GENERAL:  Fatigue. No fevers or sweats.  Weight down 2 pounds. PERFORMANCE STATUS (ECOG):  1 HEENT: No visual changes, runny nose, sore throat. Pulmonary:  Shortness of breath with exertion or bending over.  No hemoptysis. Cardiac:  No chest pain, palpitations, orthopnea, or PND. GI:  Appetite 75%.  Constipation.  No nausea, vomiting, diarrhea, melena or hematochezia. GU:  No urgency, frequency, dysuria, or hematuria. Musculoskeletal:  No back pain.  No joint pain.  No muscle tenderness. Extremities:  No pain or swelling. Skin:  No rashes or skin changes. Neuro:  No headache, numbness or weakness, balance or coordination issues. Endocrine:  Diabetes.  No thyroid issues, hot flashes or night sweats. Psych:  No mood changes, depression or anxiety.  Poor sleep. Pain:  No focal pain. Review of systems:  All other systems reviewed and found to be negative.   Physical Exam: Blood pressure 148/72, pulse 72, temperature 97.8 F (36.6 C), temperature source Oral, resp. rate 18, weight 215 lb 13.3 oz (97.9 kg).  GENERAL:  Well developed, well nourished, gentleman sitting comfortably in the exam room in no acute distress. MENTAL STATUS:  Alert and oriented to person, place and time. HEAD:  Pearline Cables short hair.  Normocephalic, atraumatic, face symmetric, no Cushingoid features. EYES:  Glasses. Blue eyes.  Pupils equal round and reactive to light and accomodation.  No conjunctivitis or scleral icterus. ENT:  Oropharynx clear without lesion.  Tongue normal. Mucous  membranes moist.  RESPIRATORY:  Clear to auscultation without rales, wheezes or rhonchi. CARDIOVASCULAR:  Regular rate and rhythm without murmur, rub or gallop. ABDOMEN:  Soft, non-tender, with active bowel sounds, and no hepatosplenomegaly.  No masses. SKIN:  No rashes, ulcers or lesions. EXTREMITIES: No edema, no skin discoloration or tenderness.  No palpable cords. LYMPH NODES: No palpable cervical, supraclavicular, axillary or inguinal adenopathy  NEUROLOGICAL: Unremarkable. PSYCH:  Appropriate.   Appointment on 02/29/2016  Component Date Value Ref Range Status  . WBC 02/29/2016 2.4* 3.8 - 10.6 K/uL Final  . RBC 02/29/2016 2.23* 4.40 - 5.90 MIL/uL Final  . Hemoglobin 02/29/2016 8.7* 13.0 - 18.0 g/dL Final  . HCT 02/29/2016 24.6* 40.0 - 52.0 % Final  . MCV 02/29/2016 110.4* 80.0 - 100.0 fL Final  . MCH 02/29/2016 39.1* 26.0 - 34.0 pg Final  . MCHC 02/29/2016 35.4  32.0 - 36.0 g/dL Final  . RDW 02/29/2016 15.3* 11.5 - 14.5 % Final  . Platelets 02/29/2016 94* 150 - 440 K/uL Final  . Neutrophils Relative % 02/29/2016 65%   Final  . Neutro Abs 02/29/2016 1.6  1.4 - 6.5 K/uL Final  . Lymphocytes Relative 02/29/2016 21%   Final  . Lymphs Abs 02/29/2016 0.5* 1.0 - 3.6 K/uL Final  . Monocytes Relative 02/29/2016 7%   Final  . Monocytes Absolute 02/29/2016 0.2  0.2 - 1.0 K/uL Final  . Eosinophils Relative 02/29/2016 6%   Final  . Eosinophils Absolute 02/29/2016 0.1  0 - 0.7 K/uL Final  . Basophils Relative 02/29/2016 1%   Final  . Basophils Absolute 02/29/2016 0.0  0 - 0.1 K/uL Final    Assessment:  Alex Smith is a 81 y.o. 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  He was diagnosed with hairy cell leukemia in 2006.  He received cladribine 11/06-11/05/2005.  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   Bone marrow aspirate and biopsy on 02/07/2015 revealed a extensive marrow involvement by  recurrent hairy cell leukemia (approximately 80% of cells  in the core). There was a monoclonal plasma cell infiltrate (approximately 10%), compatible with a plasma cell neoplasm. Marrow was hypercellular (40-50%) with markedly diminished residual trilineage hematopoiesis and mild multilineage dyspoiesis.  Peripheral smear revealed leukopenia (WBC 1900) with 2% atypical lymphoid cells compatible with hairy cell leukemia. There was moderate anemia with anisopoikilocytosis. 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  FISH studies for MDS were negative.  Cytogenetics were normal (46, XY).  He has an unclear allergy history.  He may be allergic to Primaxin, Zoloft, allopurinol, and a chemotherapy medication.  Reaction occurred in 06/2005.  He is allergic to sulfa.  He is not allergic to dapsone.  He requires washed RBCs.  He is on prophylactic acyclovir.  Dapsone and voriconazole were discontinued.  He has a history of shingles prior to prophylactic acyclovir.  He is 12 months status post cladribine (began 03/03/2015).  His counts plateaued on 07/25/2015 and have subsequently decreased.  Labs on 07/25/2015 revealed a normal B12 (894), folate (60.5), and TSH (2.562).  Ferritin was 52.  Bone marrow on 11/03/2015 revealed persistent plasma cell neoplasm, now with mildy atypical monoclonal plasma cells estimated at 20-30%.   There was no morphologic evidence of residual hairy cell leukemia.  There was a minute population (0.03%) with hairy cell immunophenotype detected by flow.  Marrow was normocellular for age with relative erythroid hyperplasia, relative myeloid hypoplasia, mild dyspoiesis, adequate megakaryocytes and no increased blasts.  There was no significant increase in marrow reticulin fibers.  Iron was present.  FISH studies for MDS were negative.  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  Cytogenetics were normal (46, XY).  SPEP on 11/17/2015  revealed a 1.3 gm/dL monoclonal spike (1.4 gm/dL on 07/25/2015 and 1.0 on 02/03/2015).  Kappa free light chains were 50.51, lambda free light chains were 10.48 with a ratio of 4.82 (ratio 3.68 on 07/25/2015).  Beta2-microglobulin was 2.5.  24 hour urine on 11/21/2015 revealed 234.8 mg protein in 24 hours (no monoclonal protein).  Bone survey on 11/24/2015 revealed no lytic lesions.  Symptomatically, he has chronic fatigue.  Exam is stable.  Counts are slowly drifting down.  Plan: 1.  Labs today:  CBC with diff 2.  Review consult at [facility].  Discuss evaluation and management. 3.  Schedule PET scan 4.  Schedule bone marrow aspirate and biopsy. 5.  Continue port flush every 6-8 weeks. 6.  RTC 2 weeks after PET scan and bone marrow for MD review and discussion regarding direction of therapy.   Lequita Asal, MD  02/29/2016, 10:27 AM

## 2016-03-01 ENCOUNTER — Telehealth: Payer: Self-pay | Admitting: *Deleted

## 2016-03-01 ENCOUNTER — Telehealth: Payer: Self-pay

## 2016-03-01 NOTE — Telephone Encounter (Signed)
Called pt and went over info and it is in telephone note

## 2016-03-01 NOTE — Telephone Encounter (Signed)
Called pt and went over the schedule for pet and BM bx and f/u appt after.  The bone marrow is 7/26 8 am and arrive at 7 am. NPO after midnight even though I wrote on request that pt only wants local numbing med.  Then 7/27 arrive 9 am and scan 9:30.  Went over npo 4 hours and if you ate protein- avoid carbs. The night before he needs high protein and low carbs.   Then f/u appt 8/10 at 3 pm to see md. He wrote all info down and if has any questions he will call.

## 2016-03-01 NOTE — Telephone Encounter (Signed)
Patient stated that "the nurse who wears all white" called him and he was calling back.

## 2016-03-11 ENCOUNTER — Other Ambulatory Visit: Payer: Self-pay | Admitting: *Deleted

## 2016-03-12 ENCOUNTER — Other Ambulatory Visit: Payer: Self-pay | Admitting: General Surgery

## 2016-03-13 ENCOUNTER — Ambulatory Visit
Admission: RE | Admit: 2016-03-13 | Discharge: 2016-03-13 | Disposition: A | Discharge status: Home / Self Care | Payer: Medicare Other | Source: Ambulatory Visit | Attending: Hematology and Oncology | Admitting: Hematology and Oncology

## 2016-03-13 DIAGNOSIS — C9000 Multiple myeloma not having achieved remission: Secondary | ICD-10-CM | POA: Diagnosis not present

## 2016-03-13 DIAGNOSIS — R896 Abnormal cytological findings in specimens from other organs, systems and tissues: Secondary | ICD-10-CM | POA: Insufficient documentation

## 2016-03-13 DIAGNOSIS — C9141 Hairy cell leukemia, in remission: Secondary | ICD-10-CM | POA: Diagnosis not present

## 2016-03-13 DIAGNOSIS — D709 Neutropenia, unspecified: Secondary | ICD-10-CM | POA: Diagnosis not present

## 2016-03-13 DIAGNOSIS — D72810 Lymphocytopenia: Secondary | ICD-10-CM | POA: Diagnosis not present

## 2016-03-13 DIAGNOSIS — D539 Nutritional anemia, unspecified: Secondary | ICD-10-CM | POA: Diagnosis not present

## 2016-03-13 DIAGNOSIS — D72819 Decreased white blood cell count, unspecified: Secondary | ICD-10-CM | POA: Diagnosis not present

## 2016-03-13 DIAGNOSIS — D46Z Other myelodysplastic syndromes: Secondary | ICD-10-CM

## 2016-03-13 DIAGNOSIS — D4989 Neoplasm of unspecified behavior of other specified sites: Secondary | ICD-10-CM | POA: Diagnosis not present

## 2016-03-13 DIAGNOSIS — D469 Myelodysplastic syndrome, unspecified: Secondary | ICD-10-CM | POA: Diagnosis not present

## 2016-03-13 HISTORY — DX: Anemia, unspecified: D64.9

## 2016-03-13 LAB — CBC
HEMATOCRIT: 23.6 % — AB (ref 40.0–52.0)
HEMOGLOBIN: 8.1 g/dL — AB (ref 13.0–18.0)
MCH: 38.8 pg — ABNORMAL HIGH (ref 26.0–34.0)
MCHC: 34.5 g/dL (ref 32.0–36.0)
MCV: 112.5 fL — ABNORMAL HIGH (ref 80.0–100.0)
Platelets: 81 10*3/uL — ABNORMAL LOW (ref 150–440)
RBC: 2.1 MIL/uL — AB (ref 4.40–5.90)
RDW: 15.1 % — ABNORMAL HIGH (ref 11.5–14.5)
WBC: 2.1 10*3/uL — AB (ref 3.8–10.6)

## 2016-03-13 LAB — DIFFERENTIAL
Basophils Absolute: 0 10*3/uL (ref 0–0.1)
Basophils Relative: 1 %
Eosinophils Absolute: 0.2 10*3/uL (ref 0–0.7)
Eosinophils Relative: 8 %
Lymphocytes Relative: 14 %
Lymphs Abs: 0.3 10*3/uL — ABNORMAL LOW (ref 1.0–3.6)
Monocytes Absolute: 0.2 10*3/uL (ref 0.2–1.0)
Monocytes Relative: 9 %
Neutro Abs: 1.4 10*3/uL (ref 1.4–6.5)
Neutrophils Relative %: 70 %

## 2016-03-13 LAB — PROTIME-INR
INR: 0.83
PROTHROMBIN TIME: 11.6 s (ref 11.4–15.2)

## 2016-03-13 LAB — APTT: APTT: 30 s (ref 24–36)

## 2016-03-13 IMAGING — CT CT BIOPSY
2 series · 13 of 32 positions shown, 19 images · non-contrast
Comparison: none

CLINICAL DATA: History of treated hairy cell leukemia. Bone marrow
biopsy required to monitor underlying myelodysplasia.

[Series 2: routine abdomen · axial · 0.67mm/px · z∈[+288,+304]mm · 2 of 26 slices shown]
[im 3/26  soft-tissue]
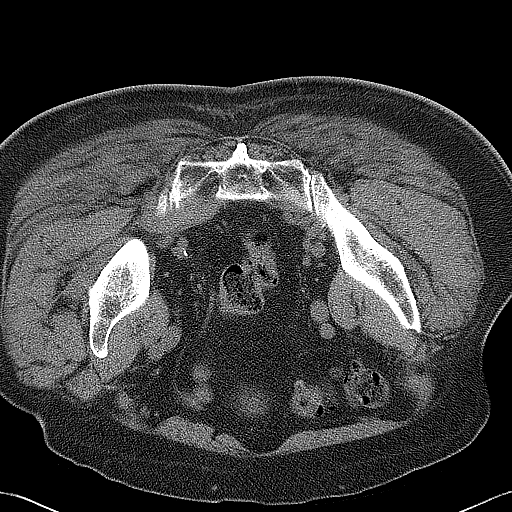
[im 6/26  soft-tissue]
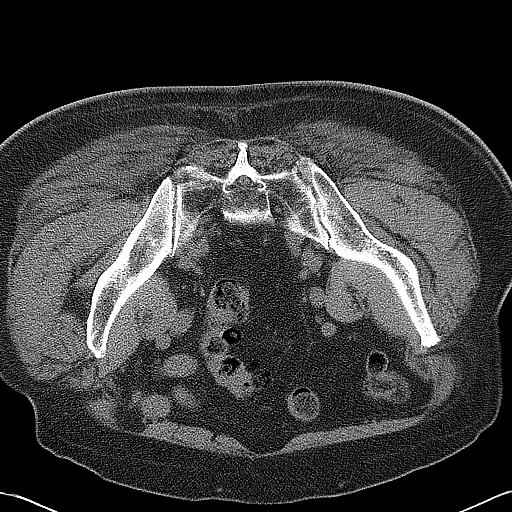

[Series 3: biopsy 2.4 b70s · axial · 0.67mm/px · z∈[+314,+324]mm · 11 of 18 slices shown, 17 images]
[im 2/18  soft-tissue]
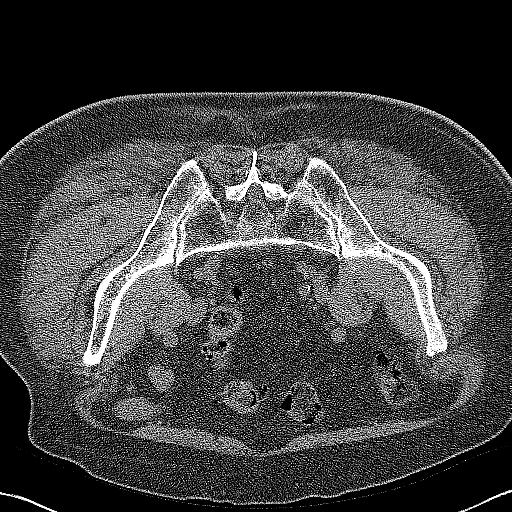
[im 2/18  bone]
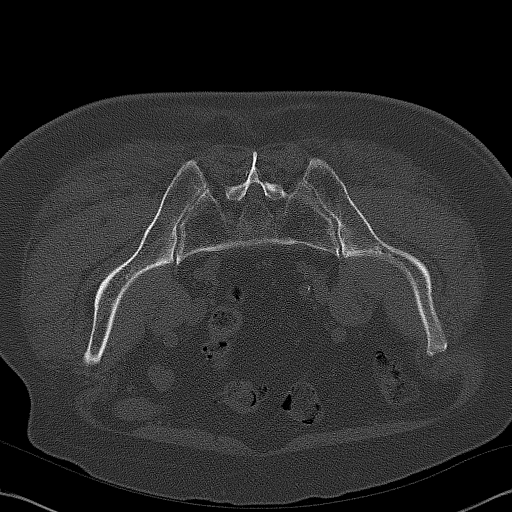
[im 3/18  soft-tissue]
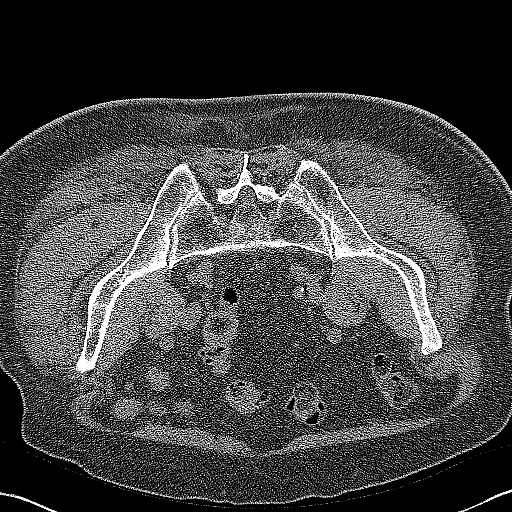
[im 5/18  soft-tissue]
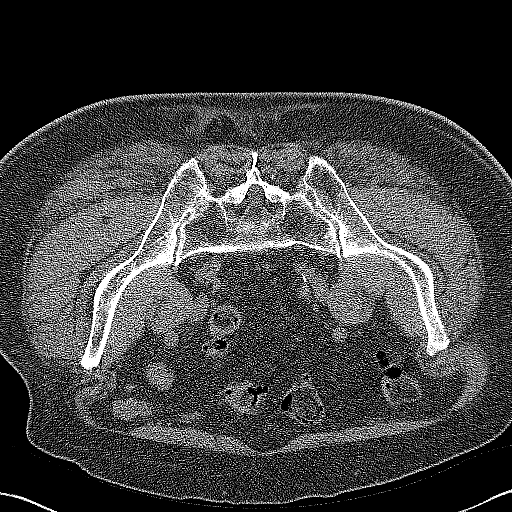
[im 6/18  soft-tissue]
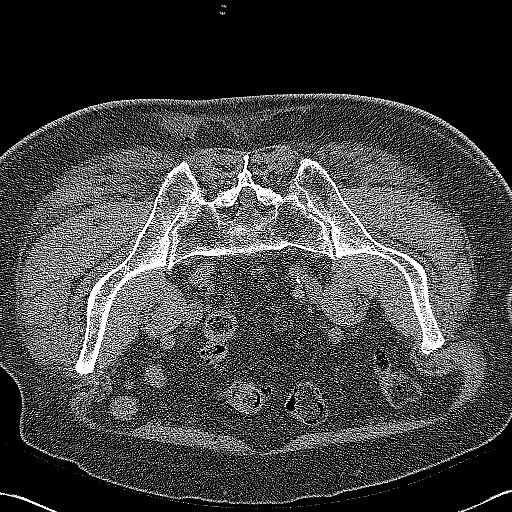
[im 8/18  soft-tissue]
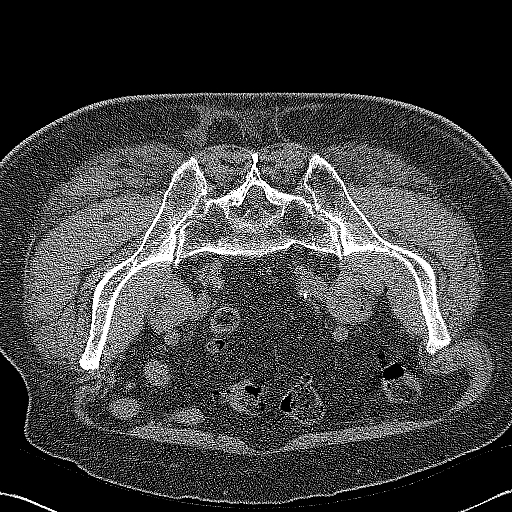
[im 9/18  soft-tissue]
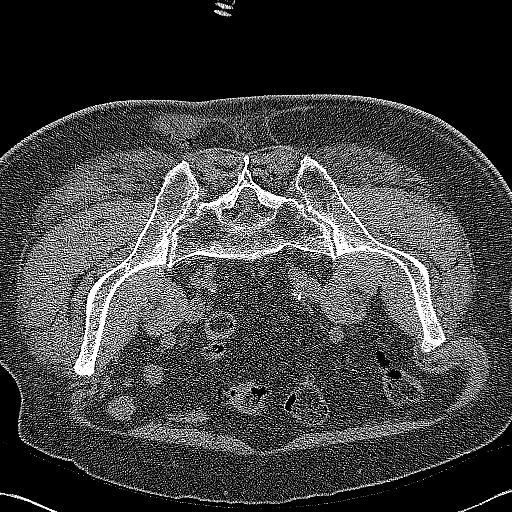
[im 10/18  soft-tissue]
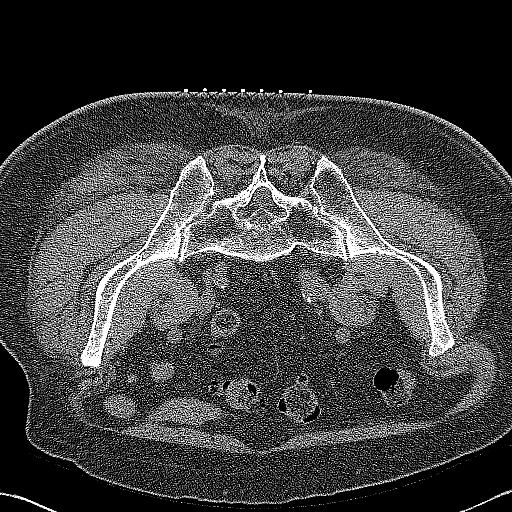
[im 12/18  soft-tissue]
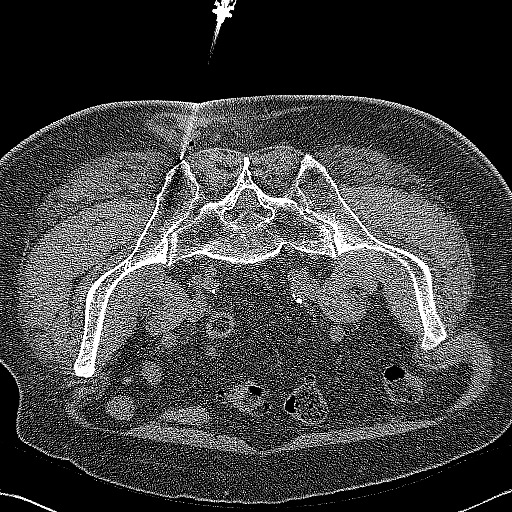
[im 12/18  lung]
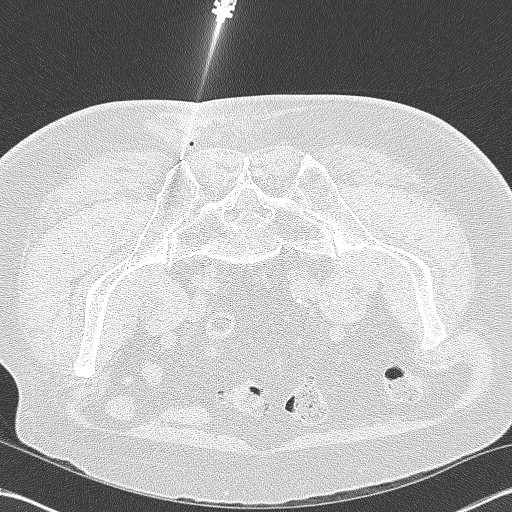
[im 13/18  soft-tissue]
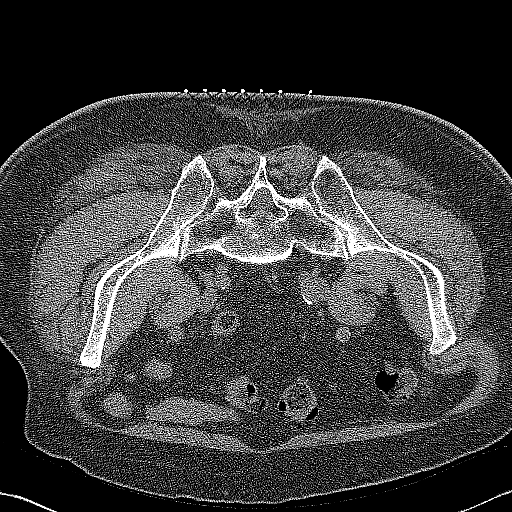
[im 13/18  lung]
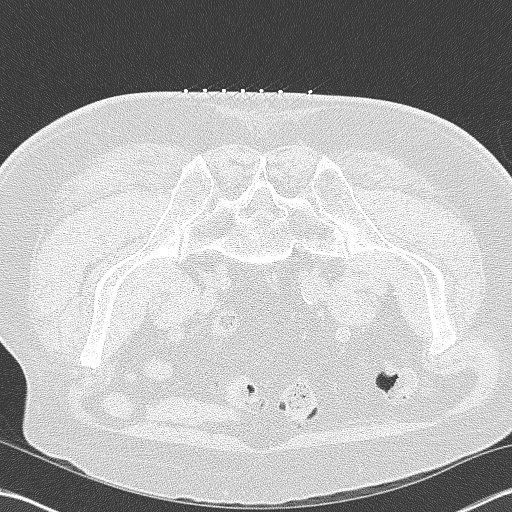
[im 13/18  bone]
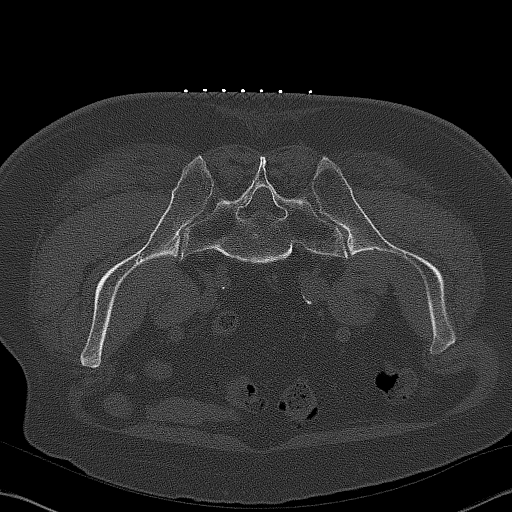
[im 15/18  soft-tissue]
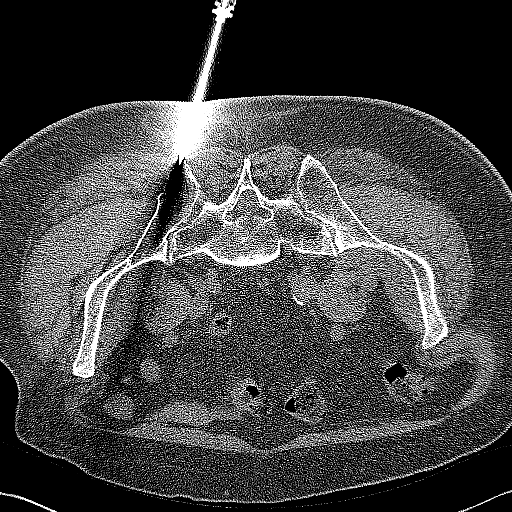
[im 15/18  lung]
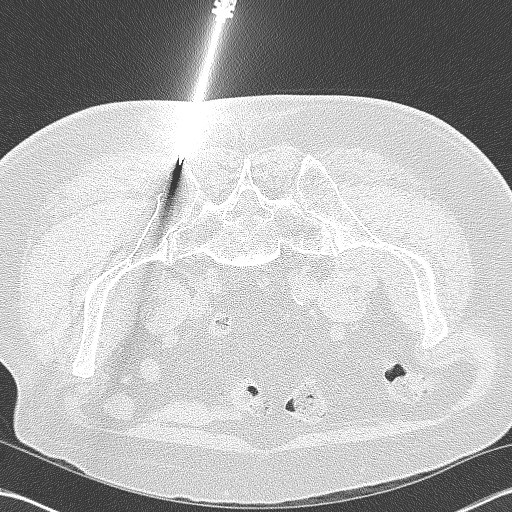
[im 16/18  soft-tissue]
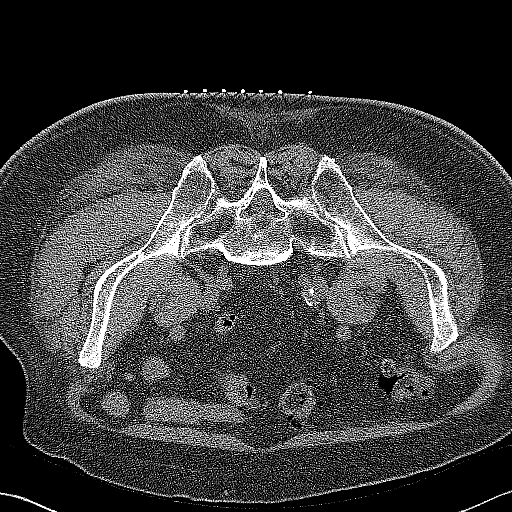
[im 16/18  lung]
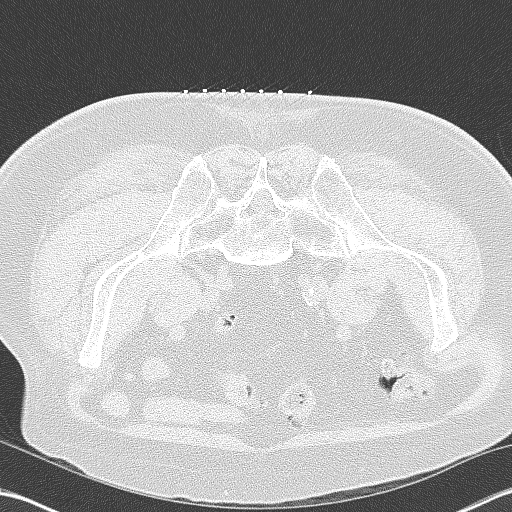

[13 of 32 positions shown; findings below may reference images not displayed]

EXAM:
CT GUIDED BONE MARROW BIOPSY AND ASPIRATION

ANESTHESIA/SEDATION:
Formal moderate sedation was not administered at the patient's
request. 50 mcg IV fentanyl was given during the procedure.

PROCEDURE:
The procedure risks, benefits, and alternatives were explained to
the patient. Questions regarding the procedure were encouraged and
answered. The patient understands and consents to the procedure. A
time-out was performed prior to initiating the procedure.

The right gluteal region was prepped with chlorhexidine. Sterile
gown and sterile gloves were used for the procedure. Local
anesthesia was provided with 1% Lidocaine.

Under CT guidance, an 11 gauge OnControl bone cutting needle was
advanced from a posterior approach into the right iliac bone. Needle
positioning was confirmed with CT. Initial non heparinized and
heparinized aspirate samples were obtained of bone marrow.

Core biopsy was performed with the 11 G needle.

COMPLICATIONS:
None
FINDINGS: Inspection of initial aspirate did reveal visible particles. Intact
core biopsy sample was obtained.
IMPRESSION: CT guided bone marrow biopsy of right posterior iliac bone with both
aspirate and core samples obtained.

## 2016-03-13 MED ORDER — SODIUM CHLORIDE 0.9 % IV SOLN
INTRAVENOUS | Status: DC
  Administered 2016-03-13: 08:00:00 via INTRAVENOUS

## 2016-03-13 MED ORDER — FENTANYL CITRATE (PF) 100 MCG/2ML IJ SOLN
INTRAMUSCULAR | Status: AC | PRN
  Administered 2016-03-13: 50 ug via INTRAVENOUS

## 2016-03-13 NOTE — Procedures (Signed)
Interventional Radiology Procedure Note  Procedure: CT guided aspirate and core biopsy of right iliac bone Complications: None Recommendations: - Bedrest supine x 1 hrs - Follow biopsy results  Ieisha Gao T. Dakarai Mcglocklin, M.D Pager:  [phone]   

## 2016-03-13 NOTE — H&P (Signed)
Chief Complaint: Here for bone marrow biopsy.  Referring Physician(s): Bowlus C  Patient Status: Outpatient  History of Present Illness: Alex Smith is a 81 y.o. male with history of hairy cell leukemia here for bone marrow biopsy to evaluate myelodysplasia.  Status post prior CT guided bone marrow biopsy procedures on 11/03/15 and 02/07/15.  Does not want sedative today as he plans to drive himself home.  Past Medical History:  Diagnosis Date  . Anemia   . B12 deficiency   . Basal cell carcinoma of face   . BPH (benign prostatic hypertrophy)    followed by urology, discharged (Dr. Bernardo Heater)  . CAP (community acquired pneumonia) 02/15/2015  . Colon polyps   . Diverticulosis   . GERD (gastroesophageal reflux disease)   . Hairy cell leukemia ([place]) 2006   recurrent, seizure on rituxan, now on cladribine ([place])  . History of pneumonia 2000's   "once" (07/07/2012)  . History of shingles   . HLD (hyperlipidemia)   . Hypertension   . Pneumonia   . Shortness of breath dyspnea   . Type 2 diabetes, controlled, with retinopathy ([place])     Past Surgical History:  Procedure Laterality Date  . [place] VITRECTOMY WITH 20 GAUGE MVR PORT FOR MACULAR HOLE  07/07/2012   Procedure: 25 GAUGE PARS PLANA VITRECTOMY WITH 20 GAUGE MVR PORT FOR MACULAR HOLE;  Surgeon: Hayden Pedro, MD;  Location: [place];  Service: Ophthalmology;  Laterality: Left;  . BONE MARROW BIOPSY  2016  . CARDIOVASCULAR STRESS TEST  2013   treadmill - no evidence ischemia, EF 61%  . CATARACT EXTRACTION W/ INTRAOCULAR LENS  IMPLANT, BILATERAL  ~ 2010  . COLONOSCOPY  2014   [facility] WNL no rpt needed, h/o polyps  . EYE SURGERY Left 06/2012   laser surgery  . GAS INSERTION  07/07/2012   Procedure: INSERTION OF GAS;  Surgeon: Hayden Pedro, MD;  Location: [place];  Service: Ophthalmology;  Laterality: Left;  C3F8  . PERIPHERAL VASCULAR CATHETERIZATION N/A 02/23/2015   Procedure: Glori Luis Cath  Insertion;  Surgeon: Algernon Huxley, MD;  Location: [place] [facility];  Service: Cardiovascular;  Laterality: N/A;  . SERUM PATCH  07/07/2012   Procedure: SERUM PATCH;  Surgeon: Hayden Pedro, MD;  Location: [place];  Service: Ophthalmology;  Laterality: Left;  . SKIN CANCER EXCISION     "all over my face" (07/07/2012)    Allergies: Rituximab; Blood-group specific substance; Primaxin [imipenem]; and Sulfa antibiotics  Medications: Prior to Admission medications   Medication Sig Start Date End Date Taking? Authorizing Provider  acyclovir (ZOVIRAX) 400 MG tablet TAKE 2 TABLETS BY MOUTH TWICE DAILY 02/06/16  Yes Ria Bush, MD  aspirin EC 81 MG tablet Take 81 mg by mouth daily.   Yes Historical Provider, MD  glucose blood (ACCU-CHEK AVIVA PLUS) test strip Use to check sugar twice daily Dx:E11.9 09/26/14  Yes Ria Bush, MD  HUMULIN R 100 UNIT/ML injection INJCET [place] 06/27/15  Yes Ria Bush, MD  hydrochlorothiazide (HYDRODIURIL) 25 MG tablet TAKE ONE TABLET EVERY DAY 07/03/15  Yes Ria Bush, MD  insulin NPH Human (HUMULIN N) 100 UNIT/ML injection Inject 0.35 mLs (35 Units total) into the skin 2 (two) times daily before a meal. 10/12/14  Yes Ria Bush, MD  lidocaine-prilocaine (EMLA) cream Apply 1 application topically as needed. 02/28/15  Yes Lequita Asal, MD  lisinopril (PRINIVIL,ZESTRIL) 20 MG tablet TAKE  ONE TABLET BY MOUTH TWICE DAILY 09/04/15  Yes Ria Bush, MD  metFORMIN (GLUCOPHAGE) 1000 MG tablet TAKE ONE TABLET BY MOUTH TWICE DAILY AS DIRECTED 02/06/16  Yes Ria Bush, MD  MICROLET LANCETS MISC USE AS DIRECTED 04/17/15  Yes Ria Bush, MD  pravastatin (PRAVACHOL) 20 MG tablet Take 1 tablet (20 mg total) by mouth at bedtime. 10/19/15  Yes Ria Bush, MD  ranitidine (ZANTAC) 150 MG tablet Take 1 tablet (150 mg total) by mouth 2 (two) times daily as needed. 11/20/15  Yes Ria Bush, MD    tamsulosin (FLOMAX) 0.4 MG CAPS capsule Take 0.4 mg by mouth daily.   Yes Historical Provider, MD  temazepam (RESTORIL) 7.5 MG capsule Take by mouth. 03/03/15  Yes Historical Provider, MD  vitamin B-12 (CYANOCOBALAMIN) 1000 MCG tablet Take 1,000 mcg by mouth daily.   Yes Historical Provider, MD  vitamin E 400 UNIT capsule Take 200 Units by mouth daily.    Yes Historical Provider, MD  traZODone (DESYREL) 50 MG tablet Take 0.5-1 tablets (25-50 mg total) by mouth at bedtime as needed for sleep. 11/20/15   Ria Bush, MD     Family History  Problem Relation Age of Onset  . Dementia Mother   . Heart failure Father 83  . Cancer Sister     breast  . Diabetes Paternal Uncle   . Diabetes Paternal Aunt   . CAD Brother 90    MI  . Stroke Neg Hx     Social History   Social History  . Marital status: Married    Spouse name: N/A  . Number of children: N/A  . Years of education: N/A   Social History Main Topics  . Smoking status: Former Smoker    Packs/day: 2.00    Years: 25.00    Types: Cigarettes    Quit date: 02/06/1969  . Smokeless tobacco: Never Used     Comment: 07/07/2012 "stopped smoking ~ 40 yr ago; smoked 20-17yr  . Alcohol use 0.0 oz/week     Comment: rare  . Drug use: No  . Sexual activity: No   Other Topics Concern  . None   Social History Narrative   Married >50 yrs, cares for wife. 1 cat   Occupation: was cArt gallery manager still works for [employer]   Activity: likes to golf   Diet: moderate water, fruits/vegetables daily    ECOG Status: 0 - Asymptomatic  Review of Systems: A 12 point ROS discussed and pertinent positives are indicated in the HPI above.  All other systems are negative.  Review of Systems  Vital Signs: BP (!) 163/64   Temp 98.4 F (36.9 C)   Resp 16   SpO2 98%   Physical Exam  Imaging: No results found.  Labs:  CBC:  Recent Labs  12/15/15 0920 12/29/15 0953 01/29/16 0935 02/29/16 0914  WBC 2.8* 2.2* 2.5* 2.4*  HGB  9.2* 9.0* 8.9* 8.7*  HCT 26.0* 25.5* 25.0* 24.6*  PLT 98* 90* 90* 94*    COAGS:  Recent Labs  11/03/15 1056  INR 1.08  APTT 30    BMP:  Recent Labs  07/25/15 1512 09/26/15 1354 10/24/15 1454 11/17/15 1627  NA 131* 133* 134* 136  K 4.0 4.1 4.1 4.2  CL 101 104 105 108  CO2 _0 GLUCOSE 237* 258* 257* 166*  BUN 34* 29* 26* 26*  CALCIUM 8.9 8.7* 8.5* 9.1  CREATININE 1.40* 1.13 1.21 1.11  GFRNONAA 47* >60 56* >60  GFRAA 54* >60 >60 >60    LIVER FUNCTION TESTS:  Recent Labs  07/25/15 1512 09/26/15 1354 10/24/15 1454 11/17/15 1627  BILITOT 0.8 0.8 0.7 0.6  AST _0 ALT 16* _1 ALKPHOS 75 69 59 57  PROT 8.0 7.1 7.5 7.2  ALBUMIN 4.3 4.1 4.1 4.2    Assessment and Plan:  For CT guided bone marrow aspirate and biopsy today.  Consent obtained.  Will plan to perform with local anesthetic and some IV fentanyl for pain relief, if needed.   Electronically SignedAletta Edouard T 03/13/2016, 8:01 AM

## 2016-03-13 NOTE — Discharge Instructions (Signed)
Bone Marrow Aspiration and Bone Marrow Biopsy, Care After °Refer to this sheet in the next few weeks. These instructions provide you with information about caring for yourself after your procedure. Your health care provider may also give you more specific instructions. Your treatment has been planned according to current medical practices, but problems sometimes occur. Call your health care provider if you have any problems or questions after your procedure. °WHAT TO EXPECT AFTER THE PROCEDURE °After your procedure, it is common to have: °· Soreness or tenderness around the puncture site. °· Bruising. °HOME CARE INSTRUCTIONS °· Take medicines only as directed by your health care provider. °· Follow your health care provider's instructions about: °¨ Puncture site care. °¨ Bandage (dressing) changes and removal. °· Bathe and shower as directed by your health care provider. °· Check your puncture site every day for signs of infection. Watch for: °¨ Redness, swelling, or pain. °¨ Fluid, blood, or pus. °· Return to your normal activities as directed by your health care provider. °· Keep all follow-up visits as directed by your health care provider. This is important. °SEEK MEDICAL CARE IF: °· You have a fever. °· You have uncontrollable bleeding. °· You have redness, swelling, or pain at the site of your puncture. °· You have fluid, blood, or pus coming from your puncture site. °  °This information is not intended to replace advice given to you by your health care provider. Make sure you discuss any questions you have with your health care provider. °  °Document Released: 02/22/2005 Document Revised: 12/20/2014 Document Reviewed: 07/27/2014 °Elsevier Interactive Patient Education ©2016 [employer] ° °

## 2016-03-14 ENCOUNTER — Ambulatory Visit
Admission: RE | Admit: 2016-03-14 | Discharge: 2016-03-14 | Disposition: A | Discharge status: Home / Self Care | Payer: Medicare Other | Source: Ambulatory Visit | Attending: Hematology and Oncology | Admitting: Hematology and Oncology

## 2016-03-14 ENCOUNTER — Encounter
Admission: RE | Admit: 2016-03-14 | Discharge: 2016-03-14 | Disposition: A | Discharge status: Home / Self Care | Payer: Medicare Other | Source: Ambulatory Visit | Attending: Hematology and Oncology | Admitting: Hematology and Oncology

## 2016-03-14 DIAGNOSIS — D46Z Other myelodysplastic syndromes: Secondary | ICD-10-CM

## 2016-03-14 DIAGNOSIS — C9141 Hairy cell leukemia, in remission: Secondary | ICD-10-CM | POA: Insufficient documentation

## 2016-03-14 DIAGNOSIS — C9000 Multiple myeloma not having achieved remission: Secondary | ICD-10-CM

## 2016-03-14 DIAGNOSIS — C9140 Hairy cell leukemia not having achieved remission: Secondary | ICD-10-CM | POA: Diagnosis not present

## 2016-03-14 LAB — GLUCOSE, CAPILLARY: Glucose-Capillary: 164 mg/dL — ABNORMAL HIGH (ref 65–99)

## 2016-03-14 IMAGING — PT NM PET TUM IMG INITIAL (PI) WHOLE BODY
1 of 9 series · 3 of 25 positions shown · non-contrast
Comparison: None.

CLINICAL DATA: Initial treatment strategy for hairy cell leukemia,
multiple myeloma, myelodysplasia. Recent lumbar spine biopsy

EXAM:
NUCLEAR MEDICINE PET WHOLE BODY
TECHNIQUE: 12.2 mCi F-18 FDG was injected intravenously. Full-ring PET imaging
was performed from the vertex to the feet after the radiotracer. CT
data was obtained and used for attenuation correction and anatomic
localization.
FASTING BLOOD GLUCOSE:  Value:  164 mg/dl

[Series 3: ct wb 5.0 b30f · axial · 5.0mm · 0.98mm/px · z∈[-1466,-562]mm · 3 of 603 slices shown]
[im 151/603  soft-tissue]
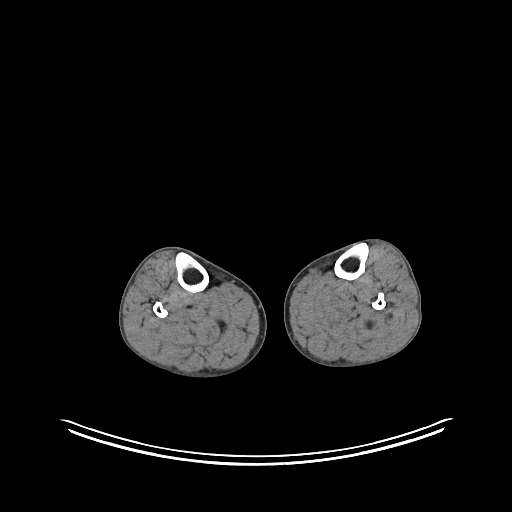
[im 302/603  soft-tissue]
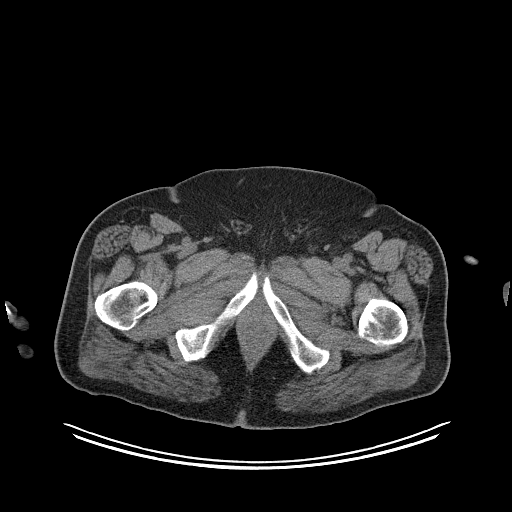
[im 452/603  soft-tissue]
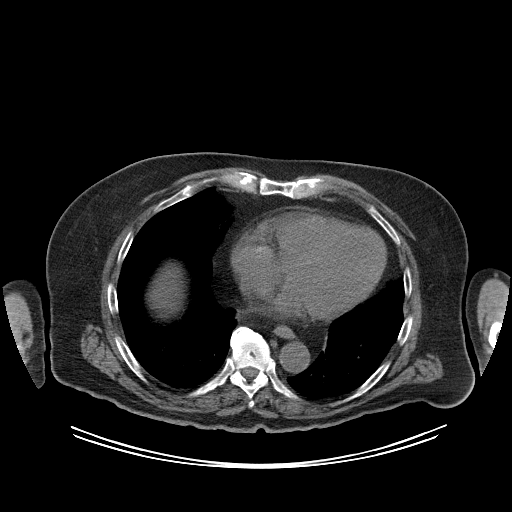

[3 of 25 positions shown; findings below may reference images not displayed]

FINDINGS: Head/Neck: No hypermetabolic lymph nodes in the neck.

Chest: No hypermetabolic mediastinal or hilar nodes. No suspicious
pulmonary nodules on the CT scan.

Abdomen/Pelvis: No abnormal hypermetabolic activity within the
liver, pancreas, adrenal glands, or spleen. No hypermetabolic lymph
nodes in the abdomen or pelvis.

Incidental note of small gallstones and LEFT renal cyst.
Atherosclerotic calcification aorta.

Skeleton: No focal hypermetabolic activity to suggest skeletal
metastasis.

Extremities: No hypermetabolic activity to suggest metastasis.
IMPRESSION: 1. No evidence of hypermetabolic lymphadenopathy.
2. No focal skeletal lesionsto suggest marrow involvement or
cortical bone involvement.

## 2016-03-14 MED ORDER — FLUDEOXYGLUCOSE F - 18 (FDG) INJECTION
12.1930 | Freq: Once | INTRAVENOUS | Status: AC
  Administered 2016-03-14: 12.193 via INTRAVENOUS

## 2016-03-20 LAB — GLUCOSE, CAPILLARY: Glucose-Capillary: 128 mg/dL — ABNORMAL HIGH (ref 65–99)

## 2016-03-28 ENCOUNTER — Inpatient Hospital Stay (HOSPITAL_BASED_OUTPATIENT_CLINIC_OR_DEPARTMENT_OTHER): Payer: Medicare Other | Admitting: Hematology and Oncology

## 2016-03-28 ENCOUNTER — Inpatient Hospital Stay: Payer: Medicare Other | Attending: Hematology and Oncology

## 2016-03-28 ENCOUNTER — Other Ambulatory Visit: Payer: Self-pay | Admitting: Hematology and Oncology

## 2016-03-28 VITALS — BP 145/65 | HR 93 | Temp 98.8°F | Resp 18 | Wt 216.7 lb

## 2016-03-28 DIAGNOSIS — E119 Type 2 diabetes mellitus without complications: Secondary | ICD-10-CM

## 2016-03-28 DIAGNOSIS — Z803 Family history of malignant neoplasm of breast: Secondary | ICD-10-CM | POA: Diagnosis not present

## 2016-03-28 DIAGNOSIS — D649 Anemia, unspecified: Secondary | ICD-10-CM | POA: Diagnosis not present

## 2016-03-28 DIAGNOSIS — Z8619 Personal history of other infectious and parasitic diseases: Secondary | ICD-10-CM | POA: Diagnosis not present

## 2016-03-28 DIAGNOSIS — C9000 Multiple myeloma not having achieved remission: Secondary | ICD-10-CM | POA: Insufficient documentation

## 2016-03-28 DIAGNOSIS — Z85828 Personal history of other malignant neoplasm of skin: Secondary | ICD-10-CM | POA: Insufficient documentation

## 2016-03-28 DIAGNOSIS — I10 Essential (primary) hypertension: Secondary | ICD-10-CM

## 2016-03-28 DIAGNOSIS — R0602 Shortness of breath: Secondary | ICD-10-CM | POA: Insufficient documentation

## 2016-03-28 DIAGNOSIS — Z86010 Personal history of colonic polyps: Secondary | ICD-10-CM | POA: Diagnosis not present

## 2016-03-28 DIAGNOSIS — C9141 Hairy cell leukemia, in remission: Secondary | ICD-10-CM

## 2016-03-28 DIAGNOSIS — R5383 Other fatigue: Secondary | ICD-10-CM | POA: Diagnosis not present

## 2016-03-28 DIAGNOSIS — N400 Enlarged prostate without lower urinary tract symptoms: Secondary | ICD-10-CM | POA: Diagnosis not present

## 2016-03-28 DIAGNOSIS — E785 Hyperlipidemia, unspecified: Secondary | ICD-10-CM | POA: Diagnosis not present

## 2016-03-28 DIAGNOSIS — K219 Gastro-esophageal reflux disease without esophagitis: Secondary | ICD-10-CM

## 2016-03-28 DIAGNOSIS — D46Z Other myelodysplastic syndromes: Secondary | ICD-10-CM

## 2016-03-28 DIAGNOSIS — E538 Deficiency of other specified B group vitamins: Secondary | ICD-10-CM

## 2016-03-28 DIAGNOSIS — Z87891 Personal history of nicotine dependence: Secondary | ICD-10-CM | POA: Diagnosis not present

## 2016-03-28 DIAGNOSIS — Z856 Personal history of leukemia: Secondary | ICD-10-CM | POA: Diagnosis not present

## 2016-03-28 DIAGNOSIS — Z794 Long term (current) use of insulin: Secondary | ICD-10-CM

## 2016-03-28 DIAGNOSIS — Z7984 Long term (current) use of oral hypoglycemic drugs: Secondary | ICD-10-CM | POA: Diagnosis not present

## 2016-03-28 DIAGNOSIS — Z79899 Other long term (current) drug therapy: Secondary | ICD-10-CM | POA: Insufficient documentation

## 2016-03-28 DIAGNOSIS — Z7982 Long term (current) use of aspirin: Secondary | ICD-10-CM | POA: Diagnosis not present

## 2016-03-28 NOTE — Progress Notes (Signed)
[facility] day:  03/28/16  Chief Complaint: Alex Smith is a 81 y.o. male with hairy cell leukemia who is seen for review of bone marrow biopsy and PET scan and discussion regarding direction of therapy.  HPI:  The patient was last seen in the medical oncology clinic on 02/29/2016.  At that time, he had chronic fatigue.  Exam was stable.  Counts were slowly drifting down.  He underwent bone marrow aspirate and biopsy on 03/13/2016.  Marrow was normocellular to marginally hypercellular for age (20-40%) with relative erythroid hyperplasia and mild dyserythropoiesis, decreased granulopoiesis and adequate megakaryocytes.  There was a persistent 15-20% plasma cell neoplasm.  There was no overt increase in reticulin fibrosis.  Storage iron was present.  Flow cytometry revealed abnormal/monocytic plasma cell population (3% sample), consistent with a plasma cell neoplastic process.  Cytogenetics were normal (46, XY).  FISH studies for myeloma revealed a CCND1/IGH translocation, t(11;14) in 53% of nuclei.  CBC on 03/13/2016 revealed a hematocrit of 23.6, hemoglobin 8.1, MCV 112.5, platelets 81,000, WBC 2100 with an [place] of 1400.  PET scan on 03/14/2016 revealed no evidence of hypermetabolic lymphadenopathy or focal skeletal lesions to suggest marrow or cortical bone involvement.  Symptomatically, he denies any complaint.  He denies any fever or sweats.  He denies any bleeding.   Past Medical History:  Diagnosis Date  . Anemia   . B12 deficiency   . Basal cell carcinoma of face   . BPH (benign prostatic hypertrophy)    followed by urology, discharged (Dr. Bernardo Heater)  . CAP (community acquired pneumonia) 02/15/2015  . Colon polyps   . Diverticulosis   . GERD (gastroesophageal reflux disease)   . Hairy cell leukemia ([place]) 2006   recurrent, seizure on rituxan, now on cladribine ([place])  . History of pneumonia 2000's   "once" (07/07/2012)  . History  of shingles   . HLD (hyperlipidemia)   . Hypertension   . Pneumonia   . Shortness of breath dyspnea   . Type 2 diabetes, controlled, with retinopathy ([place])     Past Surgical History:  Procedure Laterality Date  . [place] VITRECTOMY WITH 20 GAUGE MVR PORT FOR MACULAR HOLE  07/07/2012   Procedure: 25 GAUGE PARS PLANA VITRECTOMY WITH 20 GAUGE MVR PORT FOR MACULAR HOLE;  Surgeon: Hayden Pedro, MD;  Location: [facility];  Service: Ophthalmology;  Laterality: Left;  . BONE MARROW BIOPSY  2016  . CARDIOVASCULAR STRESS TEST  2013   treadmill - no evidence ischemia, EF 61%  . CATARACT EXTRACTION W/ INTRAOCULAR LENS  IMPLANT, BILATERAL  ~ 2010  . COLONOSCOPY  2014   [place] WNL no rpt needed, h/o polyps  . EYE SURGERY Left 06/2012   laser surgery  . GAS INSERTION  07/07/2012   Procedure: INSERTION OF GAS;  Surgeon: Hayden Pedro, MD;  Location: [facility];  Service: Ophthalmology;  Laterality: Left;  [id number]  . PERIPHERAL VASCULAR CATHETERIZATION N/A 02/23/2015   Procedure: Glori Luis Cath Insertion;  Surgeon: Algernon Huxley, MD;  Location: [facility];  Service: Cardiovascular;  Laterality: N/A;  . SERUM PATCH  07/07/2012   Procedure: SERUM PATCH;  Surgeon: Hayden Pedro, MD;  Location: [facility];  Service: Ophthalmology;  Laterality: Left;  . SKIN CANCER EXCISION     "all over my face" (07/07/2012)    Family History  Problem Relation Age of Onset  . Dementia Mother   . Heart failure  Father 43  . Cancer Sister     breast  . Diabetes Paternal Uncle   . Diabetes Paternal Aunt   . CAD Brother 107    MI  . Stroke Neg Hx     Social History:  reports that he quit smoking about 47 years ago. His smoking use included Cigarettes. He has a 50.00 pack-year smoking history. He has never used smokeless tobacco. He reports that he drinks alcohol. He reports that he does not use drugs.  He works at [employer] 20-32 hours/week.  The patient 's wife has had 3 hip operations.  She is scheduled for hip  replacement 03/06/2016.  He takes care of his wife.  He states that they either eat out or bring food in.  He is accompanied by his daughter today.  Allergies:  Allergies  Allergen Reactions  . Rituximab Rash    Chest tightness  . Blood-Group Specific Substance Other (See Comments)    Had a post transfusion reaction of red blood cells; NOW REQUIRES WASHED BLOOD CELLS  . Primaxin [Imipenem] Other (See Comments)    Possible allergy  . Sulfa Antibiotics Itching and Rash  Possible allergy to Zoloft, allopurinol, and a chemotherapy medication.  He tolerates Dapsone.  Current Medications: Current Outpatient Prescriptions  Medication Sig Dispense Refill  . acyclovir (ZOVIRAX) 400 MG tablet TAKE 2 TABLETS BY MOUTH TWICE DAILY 120 tablet 3  . aspirin EC 81 MG tablet Take 81 mg by mouth daily.    Marland Kitchen glucose blood (ACCU-CHEK AVIVA PLUS) test strip Use to check sugar twice daily Dx:E11.9 200 each 3  . HUMULIN R 100 UNIT/ML injection INJCET 10 UNITS SUBCUTANEOUSLY TWICE DAILY BEFORE MEALS 10 mL 11  . hydrochlorothiazide (HYDRODIURIL) 25 MG tablet TAKE ONE TABLET EVERY DAY 30 tablet 11  . insulin NPH Human (HUMULIN N) 100 UNIT/ML injection Inject 0.35 mLs (35 Units total) into the skin 2 (two) times daily before a meal. 20 mL 11  . lidocaine-prilocaine (EMLA) cream Apply 1 application topically as needed. 30 g 3  . lisinopril (PRINIVIL,ZESTRIL) 20 MG tablet TAKE ONE TABLET BY MOUTH TWICE DAILY 60 tablet 6  . metFORMIN (GLUCOPHAGE) 1000 MG tablet TAKE ONE TABLET BY MOUTH TWICE DAILY AS DIRECTED 180 tablet 3  . MICROLET LANCETS MISC USE AS DIRECTED 200 each 3  . pravastatin (PRAVACHOL) 20 MG tablet Take 1 tablet (20 mg total) by mouth at bedtime. 90 tablet 2  . ranitidine (ZANTAC) 150 MG tablet Take 1 tablet (150 mg total) by mouth 2 (two) times daily as needed. 180 tablet 3  . tamsulosin (FLOMAX) 0.4 MG CAPS capsule Take 0.4 mg by mouth daily.    . temazepam (RESTORIL) 7.5 MG capsule Take 7.5 mg by  mouth once.     . traZODone (DESYREL) 50 MG tablet Take 0.5-1 tablets (25-50 mg total) by mouth at bedtime as needed for sleep. 30 tablet 3  . vitamin B-12 (CYANOCOBALAMIN) 1000 MCG tablet Take 1,000 mcg by mouth daily.    . vitamin E 400 UNIT capsule Take 200 Units by mouth daily.      No current facility-administered medications for this visit.    Facility-Administered Medications Ordered in Other Visits  Medication Dose Route Frequency Provider Last Rate Last Dose  . heparin lock flush 100 unit/mL  500 Units Intravenous Once Lequita Asal, MD      . sodium chloride 0.9 % injection 10 mL  10 mL Intravenous PRN Lequita Asal, MD   10  mL at 03/03/15 0903  . sodium chloride 0.9 % injection 10 mL  10 mL Intracatheter PRN Lequita Asal, MD   10 mL at 03/10/15 1410   Review of Systems:  GENERAL:  Chronic fatigue. No fevers or sweats.  Weight up 1 pound. PERFORMANCE STATUS (ECOG):  1 HEENT: No visual changes, runny nose, sore throat. Pulmonary:  Shortness of breath with exertion.  No hemoptysis. Cardiac:  No chest pain, palpitations, orthopnea, or PND. GI:  Appetite 25%.  Constipation.  No nausea, vomiting, diarrhea, melena or hematochezia. GU:  No urgency, frequency, dysuria, or hematuria. Musculoskeletal:  No back pain.  No joint pain.  No muscle tenderness. Extremities:  No pain or swelling. Skin:  No rashes or skin changes. Neuro:  No headache, numbness or weakness, balance or coordination issues. Endocrine:  Diabetes.  No thyroid issues, hot flashes or night sweats. Psych:  No mood changes, depression or anxiety.  Poor sleep. Pain:  No focal pain. Review of systems:  All other systems reviewed and found to be negative.   Physical Exam: Blood pressure (!) 145/65, pulse 93, temperature 98.8 F (37.1 C), temperature source Tympanic, resp. rate 18, weight 216 lb 11.4 oz (98.3 kg).  GENERAL:  Well developed, well nourished, gentleman sitting comfortably in the exam room in  no acute distress. MENTAL STATUS:  Alert and oriented to person, place and time. HEAD:  Pearline Cables short hair.  Normocephalic, atraumatic, face symmetric, no Cushingoid features. EYES:  Glasses. Blue eyes.  No conjunctivitis or scleral icterus. SKIN:  Bruise right hand.  Area of ecchymosis at bone marrow site.  No rashes, ulcers or lesions. EXTREMITIES: No edema, no skin discoloration or tenderness.  No palpable cords. NEUROLOGICAL: Unremarkable. PSYCH:  Appropriate.   No visits with results within 3 Day(s) from this visit.  Latest known visit with results is:  Hospital Outpatient Visit on 03/14/2016  Component Date Value Ref Range Status  . Glucose-Capillary 03/14/2016 164* 65 - 99 mg/dL Final    Assessment:  Alex Smith is a 81 y.o. 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  He was diagnosed with hairy cell leukemia in 2006.  He received cladribine 11/06-11/05/2005.  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   Bone marrow aspirate and biopsy on 02/07/2015 revealed a extensive marrow involvement by recurrent hairy cell leukemia (approximately 80% of cells in the core). There was a monoclonal plasma cell infiltrate (approximately 10%), compatible with a plasma cell neoplasm. Marrow was hypercellular (40-50%) with markedly diminished residual trilineage hematopoiesis and mild multilineage dyspoiesis.  Peripheral smear revealed leukopenia (WBC 1900) with 2% atypical lymphoid cells compatible with hairy cell leukemia. There was moderate anemia with anisopoikilocytosis. 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  FISH studies for MDS were negative.  Cytogenetics were normal (46, XY).  He has an unclear allergy history.  He may be allergic to Primaxin, Zoloft, allopurinol, and a chemotherapy medication.  Reaction occurred in 06/2005.  He is allergic to sulfa.  He is not allergic to dapsone.  He  requires washed RBCs.  He is on prophylactic acyclovir.  Dapsone and voriconazole were discontinued.  He has a history of shingles prior to prophylactic acyclovir.  He is 12 months status post cladribine (began 03/03/2015).  His counts plateaued on 07/25/2015 and have subsequently decreased.  Labs on 07/25/2015 revealed a normal B12 (894), folate (60.5), and TSH (2.562).  Ferritin was 52.  Bone  marrow on 11/03/2015 revealed persistent plasma cell neoplasm, now with mildy atypical monoclonal plasma cells estimated at 20-30%.   There was no morphologic evidence of residual hairy cell leukemia.  There was a minute population (0.03%) with hairy cell immunophenotype detected by flow.  Marrow was normocellular for age with relative erythroid hyperplasia, relative myeloid hypoplasia, mild dyspoiesis, adequate megakaryocytes and no increased blasts.  There was no significant increase in marrow reticulin fibers.  Iron was present.  FISH studies for MDS were negative.  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  Cytogenetics were normal (46, XY).  Bone marrow on 03/13/2016 was normocellular to marginally hypercellular for age (20-40%) with relative erythroid hyperplasia and mild dyserythropoiesis, decreased granulopoiesis and adequate megakaryocytes.  There was a persistent 15-20% plasma cell neoplasm.  There was no overt increase in reticulin fibrosis.  Storage iron was present.  Flow cytometry revealed abnormal/monocytic plasma cell population (3% sample), consistent with a plasma cell neoplastic process.  Cytogenetics were normal (46, XY).  FISH studies for myeloma revealed a CCND1/IGH translocation, t(11;14) in 53% of nuclei.  SPEP on 11/17/2015 revealed a 1.3 gm/dL monoclonal spike (1.4 gm/dL on 07/25/2015 and 1.0 on 02/03/2015).  Kappa free light chains were 50.51, lambda free light chains were 10.48 with a ratio of 4.82 (ratio 3.68 on 07/25/2015).   Beta2-microglobulin was 2.5.  24 hour urine on 11/21/2015 revealed 234.8 mg protein in 24 hours (no monoclonal protein).  Bone survey on 11/24/2015 revealed no lytic lesions.  PET scan on 03/14/2016 revealed no evidence of hypermetabolic lymphadenopathy or focal skeletal lesions to suggest marrow or cortical bone involvement.  Symptomatically, he has chronic fatigue.  Exam is stable.  Counts are slowly drifting down.  Plan: 1.  Review bone marrow and PET scan.  No evidence of myeloma on PET scan.  Discuss no evidence of hairy cell leukemia on bone marrow.  Note persistent, but possibly slightly smaller plasma cell population (15-20% as compared to 20-30%).  Discuss dyserythropoiesis suggestive of possible evolving myelodysplasia.  Discuss prior conversation about trial of Revlimid.  Side effects reviewed.  Discuss checking an epo level assess potential response to ESA (level < 500). 2.  RTC tomorrow for labs (CBC with diff, epo level). 3.  Information on Revlimid and erythropoietin. 4.  Phone follow-up with Dr. [name] (previously seen for second opinion at [facility]) for joint decision regarding direction of therapy. 5.  MD to call patient treatment plan.  Addendum:  Spoke with Dr. Evelene Croon.  Treatment options include low dose Revlimid + 2 days Decadron or Procrit.  Discussion regarding Revlimid increasing risk of a clonal hematopoiesis. Discuss hypomethylating agents if myelodysplastic changes progress.  After some discussion, decision was made to proceed with erythropoietin.  If counts worsen, he will have a repeat bone marrow at [facility] or [facility].   Lequita Asal, MD  03/28/2016, 4:00 PM

## 2016-03-28 NOTE — Progress Notes (Signed)
Patient is here today for follow up he is with his daughter.

## 2016-03-29 ENCOUNTER — Inpatient Hospital Stay: Payer: Medicare Other

## 2016-03-29 DIAGNOSIS — Z85828 Personal history of other malignant neoplasm of skin: Secondary | ICD-10-CM | POA: Diagnosis not present

## 2016-03-29 DIAGNOSIS — E119 Type 2 diabetes mellitus without complications: Secondary | ICD-10-CM | POA: Diagnosis not present

## 2016-03-29 DIAGNOSIS — Z79899 Other long term (current) drug therapy: Secondary | ICD-10-CM | POA: Diagnosis not present

## 2016-03-29 DIAGNOSIS — Z803 Family history of malignant neoplasm of breast: Secondary | ICD-10-CM | POA: Diagnosis not present

## 2016-03-29 DIAGNOSIS — R0602 Shortness of breath: Secondary | ICD-10-CM | POA: Diagnosis not present

## 2016-03-29 DIAGNOSIS — Z8619 Personal history of other infectious and parasitic diseases: Secondary | ICD-10-CM | POA: Diagnosis not present

## 2016-03-29 DIAGNOSIS — D46Z Other myelodysplastic syndromes: Secondary | ICD-10-CM

## 2016-03-29 DIAGNOSIS — R5383 Other fatigue: Secondary | ICD-10-CM | POA: Diagnosis not present

## 2016-03-29 DIAGNOSIS — C9000 Multiple myeloma not having achieved remission: Secondary | ICD-10-CM

## 2016-03-29 DIAGNOSIS — Z856 Personal history of leukemia: Secondary | ICD-10-CM | POA: Diagnosis not present

## 2016-03-29 DIAGNOSIS — Z794 Long term (current) use of insulin: Secondary | ICD-10-CM | POA: Diagnosis not present

## 2016-03-29 DIAGNOSIS — E538 Deficiency of other specified B group vitamins: Secondary | ICD-10-CM | POA: Diagnosis not present

## 2016-03-29 DIAGNOSIS — Z86010 Personal history of colonic polyps: Secondary | ICD-10-CM | POA: Diagnosis not present

## 2016-03-29 DIAGNOSIS — Z7984 Long term (current) use of oral hypoglycemic drugs: Secondary | ICD-10-CM | POA: Diagnosis not present

## 2016-03-29 DIAGNOSIS — I10 Essential (primary) hypertension: Secondary | ICD-10-CM | POA: Diagnosis not present

## 2016-03-29 DIAGNOSIS — N400 Enlarged prostate without lower urinary tract symptoms: Secondary | ICD-10-CM | POA: Diagnosis not present

## 2016-03-29 DIAGNOSIS — Z7982 Long term (current) use of aspirin: Secondary | ICD-10-CM | POA: Diagnosis not present

## 2016-03-29 DIAGNOSIS — D649 Anemia, unspecified: Secondary | ICD-10-CM | POA: Diagnosis not present

## 2016-03-29 DIAGNOSIS — K219 Gastro-esophageal reflux disease without esophagitis: Secondary | ICD-10-CM | POA: Diagnosis not present

## 2016-03-29 DIAGNOSIS — E785 Hyperlipidemia, unspecified: Secondary | ICD-10-CM | POA: Diagnosis not present

## 2016-03-29 DIAGNOSIS — C9141 Hairy cell leukemia, in remission: Secondary | ICD-10-CM

## 2016-03-29 DIAGNOSIS — Z87891 Personal history of nicotine dependence: Secondary | ICD-10-CM | POA: Diagnosis not present

## 2016-03-29 LAB — CBC WITH DIFFERENTIAL/PLATELET
Basophils Absolute: 0 10*3/uL (ref 0–0.1)
Basophils Relative: 1 %
Eosinophils Absolute: 0.1 10*3/uL (ref 0–0.7)
Eosinophils Relative: 6 %
HCT: 25.2 % — ABNORMAL LOW (ref 40.0–52.0)
Hemoglobin: 8.8 g/dL — ABNORMAL LOW (ref 13.0–18.0)
Lymphocytes Relative: 23 %
Lymphs Abs: 0.5 10*3/uL — ABNORMAL LOW (ref 1.0–3.6)
MCH: 39.3 pg — ABNORMAL HIGH (ref 26.0–34.0)
MCHC: 35.1 g/dL (ref 32.0–36.0)
MCV: 112 fL — ABNORMAL HIGH (ref 80.0–100.0)
Monocytes Absolute: 0.2 10*3/uL (ref 0.2–1.0)
Monocytes Relative: 8 %
Neutro Abs: 1.3 10*3/uL — ABNORMAL LOW (ref 1.4–6.5)
Neutrophils Relative %: 62 %
Platelets: 91 10*3/uL — ABNORMAL LOW (ref 150–440)
RBC: 2.25 MIL/uL — ABNORMAL LOW (ref 4.40–5.90)
RDW: 15.9 % — ABNORMAL HIGH (ref 11.5–14.5)
WBC: 2.1 10*3/uL — ABNORMAL LOW (ref 3.8–10.6)

## 2016-03-30 LAB — ERYTHROPOIETIN: Erythropoietin: 329.6 m[IU]/mL — ABNORMAL HIGH (ref 2.6–18.5)

## 2016-04-01 ENCOUNTER — Other Ambulatory Visit: Payer: Self-pay | Admitting: Family Medicine

## 2016-04-10 ENCOUNTER — Telehealth: Payer: Self-pay | Admitting: *Deleted

## 2016-04-10 NOTE — Telephone Encounter (Signed)
  Patient saw Alex Smith.  Waiting to hear back from her.  Messages left.  M

## 2016-04-10 NOTE — Telephone Encounter (Signed)
Called asking what he is to do, he has heard nothing form [place] and does not have an appt for FU. He does not know if he needs tx. Please call to advise what needs to be done.

## 2016-04-11 NOTE — Telephone Encounter (Signed)
Called pt's home and spoke to wife and told her dr [name] and dr Evelene Croon has been texting each other and today she was suppose to talk to her at 5 and thenshe tested and said 3 and then she texted after 7 it will be.  I hope to have something to tell him tom. Wife will pass info along to him

## 2016-04-17 ENCOUNTER — Telehealth: Payer: Self-pay | Admitting: *Deleted

## 2016-04-17 NOTE — Telephone Encounter (Signed)
Called patient to let him know we are still waiting to hear back from Dr. Evelene Croon. He was also asking when his next appt is.

## 2016-04-17 NOTE — Telephone Encounter (Signed)
Called to inquire what he is to do , what arte the plans. He does not have a return appt with [place]

## 2016-04-19 ENCOUNTER — Other Ambulatory Visit: Payer: Self-pay | Admitting: Hematology and Oncology

## 2016-04-23 ENCOUNTER — Telehealth: Payer: Self-pay | Admitting: *Deleted

## 2016-04-23 DIAGNOSIS — D46Z Other myelodysplastic syndromes: Secondary | ICD-10-CM

## 2016-04-23 NOTE — Telephone Encounter (Signed)
Called pt phone 3 times today and busy everytime. So I called wife's cell phone and left message that I know [place] spoke to him this weekend about the plan of procrit when we get it authorized.  I spoke to [place] and it is fine. I have made pt appt for Thursday at 9:15 for labs and if they can call me back and let me know if that is ok with pt.

## 2016-04-25 ENCOUNTER — Inpatient Hospital Stay: Payer: Medicare Other

## 2016-04-25 ENCOUNTER — Other Ambulatory Visit: Payer: Self-pay | Admitting: Hematology and Oncology

## 2016-04-25 ENCOUNTER — Inpatient Hospital Stay: Payer: Medicare Other | Admitting: Hematology and Oncology

## 2016-04-25 DIAGNOSIS — C9000 Multiple myeloma not having achieved remission: Secondary | ICD-10-CM

## 2016-04-25 DIAGNOSIS — D539 Nutritional anemia, unspecified: Secondary | ICD-10-CM

## 2016-04-25 DIAGNOSIS — C9141 Hairy cell leukemia, in remission: Secondary | ICD-10-CM

## 2016-04-25 DIAGNOSIS — D46Z Other myelodysplastic syndromes: Secondary | ICD-10-CM

## 2016-04-25 NOTE — Progress Notes (Deleted)
[facility] day:  04/25/16  Chief Complaint: Alex Smith is a 81 y.o. male with a myelodysplastic marrow and plasma cell neoplasm who is seen assessment prior to initiation of Procrit.  HPI:  The patient was last seen in the medical oncology clinic on 03/28/2016.  At that time, bone marrow biopsy and PET scan were reviewed.  he had chronic fatigue.  Exam was stable.  Counts were slowly drifting down.  He underwent bone marrow aspirate and biopsy on 03/13/2016.  Marrow was normocellular to marginally hypercellular for age (20-40%) with relative erythroid hyperplasia and mild dyserythropoiesis, decreased granulopoiesis and adequate megakaryocytes.  There was a persistent 15-20% plasma cell neoplasm.  There was no overt increase in reticulin fibrosis.  Storage iron was present.  Flow cytometry revealed abnormal/monocytic plasma cell population (3% sample), consistent with a plasma cell neoplastic process.  Cytogenetics were normal (46, XY).  FISH studies for myeloma revealed a CCND1/IGH translocation, t(11;14) in 53% of nuclei.  CBC on 03/13/2016 revealed a hematocrit of 23.6, hemoglobin 8.1, MCV 112.5, platelets 81,000, WBC 2100 with an Hughson of 1400.  PET scan on 03/14/2016 revealed no evidence of hypermetabolic lymphadenopathy or focal skeletal lesions to suggest marrow or cortical bone involvement.  Symptomatically,   Past Medical History:  Diagnosis Date  . Anemia   . B12 deficiency   . Basal cell carcinoma of face   . BPH (benign prostatic hypertrophy)    followed by urology, discharged (Dr. Bernardo Heater)  . CAP (community acquired pneumonia) 02/15/2015  . Colon polyps   . Diverticulosis   . GERD (gastroesophageal reflux disease)   . 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 History of pneumonia 2000's   "once" (07/07/2012)  . History of shingles   . HLD (hyperlipidemia)   .  Hypertension   . Pneumonia   . Shortness of breath dyspnea   . Type 2 diabetes, controlled, with retinopathy ([place])     Past Surgical History:  Procedure Laterality Date  . [place] VITRECTOMY WITH 20 GAUGE MVR PORT FOR MACULAR HOLE  07/07/2012   Procedure: 25 GAUGE PARS PLANA VITRECTOMY WITH 20 GAUGE MVR PORT FOR MACULAR HOLE;  Surgeon: Hayden Pedro, MD;  Location: [facility];  Service: Ophthalmology;  Laterality: Left;  . BONE MARROW BIOPSY  2016  . CARDIOVASCULAR STRESS TEST  2013   treadmill - no evidence ischemia, EF 61%  . CATARACT EXTRACTION W/ INTRAOCULAR LENS  IMPLANT, BILATERAL  ~ 2010  . COLONOSCOPY  2014   [place] WNL no rpt needed, h/o polyps  . EYE SURGERY Left 06/2012   laser surgery  . GAS INSERTION  07/07/2012   Procedure: INSERTION OF GAS;  Surgeon: Hayden Pedro, MD;  Location: [facility];  Service: Ophthalmology;  Laterality: Left;  [id number]  . PERIPHERAL VASCULAR CATHETERIZATION N/A 02/23/2015   Procedure: Glori Luis Cath Insertion;  Surgeon: Algernon Huxley, MD;  Location: [facility];  Service: Cardiovascular;  Laterality: N/A;  . SERUM PATCH  07/07/2012   Procedure: SERUM PATCH;  Surgeon: Hayden Pedro, MD;  Location: [facility];  Service: Ophthalmology;  Laterality: Left;  . SKIN CANCER EXCISION     "all over my face" (07/07/2012)    Family History  Problem Relation Age of Onset  . Dementia Mother   . Heart failure Father 27  . Cancer Sister     breast  .  Diabetes Paternal Uncle   . Diabetes Paternal Aunt   . CAD Brother 36    MI  . Stroke Neg Hx     Social History:  reports that he quit smoking about 47 years ago. His smoking use included Cigarettes. He has a 50.00 pack-year smoking history. He has never used smokeless tobacco. He reports that he drinks alcohol. He reports that he does not use drugs.  He works at [employer] 20-32 hours/week.  The patient 's wife has had 3 hip operations.  She is scheduled for hip replacement 03/06/2016.  He takes care of  his wife.  He states that they either eat out or bring food in.  He is alone today.  Allergies:  Allergies  Allergen Reactions  . Rituximab Rash    Chest tightness  . Blood-Group Specific Substance Other (See Comments)    Had a post transfusion reaction of red blood cells; NOW REQUIRES WASHED BLOOD CELLS  . Primaxin [Imipenem] Other (See Comments)    Possible allergy  . Sulfa Antibiotics Itching and Rash  Possible allergy to Zoloft, allopurinol, and a chemotherapy medication.  He tolerates Dapsone.  Current Medications: Current Outpatient Prescriptions  Medication Sig Dispense Refill  . acyclovir (ZOVIRAX) 400 MG tablet TAKE 2 TABLETS BY MOUTH TWICE DAILY 120 tablet 3  . aspirin EC 81 MG tablet Take 81 mg by mouth daily.    Marland Kitchen glucose blood (ACCU-CHEK AVIVA PLUS) test strip Use to check sugar twice daily Dx:E11.9 200 each 3  . HUMULIN R 100 UNIT/ML injection INJCET 10 UNITS SUBCUTANEOUSLY TWICE DAILY BEFORE MEALS 10 mL 11  . hydrochlorothiazide (HYDRODIURIL) 25 MG tablet TAKE ONE TABLET EVERY DAY 30 tablet 11  . insulin NPH Human (HUMULIN N) 100 UNIT/ML injection Inject 0.35 mLs (35 Units total) into the skin 2 (two) times daily before a meal. 20 mL 11  . lidocaine-prilocaine (EMLA) cream Apply 1 application topically as needed. 30 g 3  . lisinopril (PRINIVIL,ZESTRIL) 20 MG tablet TAKE ONE TABLET TWICE DAILY 60 tablet 6  . metFORMIN (GLUCOPHAGE) 1000 MG tablet TAKE ONE TABLET BY MOUTH TWICE DAILY AS DIRECTED 180 tablet 3  . MICROLET LANCETS MISC USE AS DIRECTED 200 each 3  . pravastatin (PRAVACHOL) 20 MG tablet Take 1 tablet (20 mg total) by mouth at bedtime. 90 tablet 2  . ranitidine (ZANTAC) 150 MG tablet Take 1 tablet (150 mg total) by mouth 2 (two) times daily as needed. 180 tablet 3  . tamsulosin (FLOMAX) 0.4 MG CAPS capsule Take 0.4 mg by mouth daily.    . temazepam (RESTORIL) 7.5 MG capsule Take 7.5 mg by mouth once.     . traZODone (DESYREL) 50 MG tablet Take 0.5-1 tablets  (25-50 mg total) by mouth at bedtime as needed for sleep. 30 tablet 3  . vitamin B-12 (CYANOCOBALAMIN) 1000 MCG tablet Take 1,000 mcg by mouth daily.    . vitamin E 400 UNIT capsule Take 200 Units by mouth daily.      No current facility-administered medications for this visit.    Facility-Administered Medications Ordered in Other Visits  Medication Dose Route Frequency Provider Last Rate Last Dose  . heparin lock flush 100 unit/mL  500 Units Intravenous Once Lequita Asal, MD      . sodium chloride 0.9 % injection 10 mL  10 mL Intravenous PRN Lequita Asal, MD   10 mL at 03/03/15 0903  . sodium chloride 0.9 % injection 10 mL  10 mL Intracatheter PRN  Lequita Asal, MD   10 mL at 03/10/15 1410   Review of Systems:  GENERAL:  Fatigue. No fevers or sweats.  Weight down 2 pounds. PERFORMANCE STATUS (ECOG):  1 HEENT: No visual changes, runny nose, sore throat. Pulmonary:  Shortness of breath with exertion or bending over.  No hemoptysis. Cardiac:  No chest pain, palpitations, orthopnea, or PND. GI:  Appetite 75%.  Constipation.  No nausea, vomiting, diarrhea, melena or hematochezia. GU:  No urgency, frequency, dysuria, or hematuria. Musculoskeletal:  No back pain.  No joint pain.  No muscle tenderness. Extremities:  No pain or swelling. Skin:  No rashes or skin changes. Neuro:  No headache, numbness or weakness, balance or coordination issues. Endocrine:  Diabetes.  No thyroid issues, hot flashes or night sweats. Psych:  No mood changes, depression or anxiety.  Poor sleep. Pain:  No focal pain. Review of systems:  All other systems reviewed and found to be negative.   Physical Exam: There were no vitals taken for this visit.  GENERAL:  Well developed, well nourished, gentleman sitting comfortably in the exam room in no acute distress. MENTAL STATUS:  Alert and oriented to person, place and time. HEAD:  Pearline Cables short hair.  Normocephalic, atraumatic, face symmetric, no  Cushingoid features. EYES:  Glasses. Blue eyes.  Pupils equal round and reactive to light and accomodation.  No conjunctivitis or scleral icterus. ENT:  Oropharynx clear without lesion.  Tongue normal. Mucous membranes moist.  RESPIRATORY:  Clear to auscultation without rales, wheezes or rhonchi. CARDIOVASCULAR:  Regular rate and rhythm without murmur, rub or gallop. ABDOMEN:  Soft, non-tender, with active bowel sounds, and no hepatosplenomegaly.  No masses. SKIN:  No rashes, ulcers or lesions. EXTREMITIES: No edema, no skin discoloration or tenderness.  No palpable cords. LYMPH NODES: No palpable cervical, supraclavicular, axillary or inguinal adenopathy  NEUROLOGICAL: Unremarkable. PSYCH:  Appropriate.   No visits with results within 3 Day(s) from this visit.  Latest known visit with results is:  Appointment on 03/29/2016  Component Date Value Ref Range Status  . WBC 03/29/2016 2.1* 3.8 - 10.6 K/uL Final  . RBC 03/29/2016 2.25* 4.40 - 5.90 MIL/uL Final  . Hemoglobin 03/29/2016 8.8* 13.0 - 18.0 g/dL Final  . HCT 03/29/2016 25.2* 40.0 - 52.0 % Final  . MCV 03/29/2016 112.0* 80.0 - 100.0 fL Final  . MCH 03/29/2016 39.3* 26.0 - 34.0 pg Final  . MCHC 03/29/2016 35.1  32.0 - 36.0 g/dL Final  . RDW 03/29/2016 15.9* 11.5 - 14.5 % Final  . Platelets 03/29/2016 91* 150 - 440 K/uL Final  . Neutrophils Relative % 03/29/2016 62%  % Final  . Neutro Abs 03/29/2016 1.3* 1.4 - 6.5 K/uL Final  . Lymphocytes Relative 03/29/2016 23%  % Final  . Lymphs Abs 03/29/2016 0.5* 1.0 - 3.6 K/uL Final  . Monocytes Relative 03/29/2016 8%  % Final  . Monocytes Absolute 03/29/2016 0.2  0.2 - 1.0 K/uL Final  . Eosinophils Relative 03/29/2016 6%  % Final  . Eosinophils Absolute 03/29/2016 0.1  0 - 0.7 K/uL Final  . Basophils Relative 03/29/2016 1%  % Final  . Basophils Absolute 03/29/2016 0.0  0 - 0.1 K/uL Final  . Erythropoietin 03/30/2016 329.6* 2.6 - 18.5 mIU/mL Final   Comment: (NOTE) Performed At: [facility] [address] [place], [place] [id number] Lindon Romp MD TW:[id number]     Assessment:  Alex Smith is a 81 y.o. 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  He was  diagnosed with hairy cell leukemia in 2006.  He received cladribine 11/06-11/05/2005.  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   Bone marrow aspirate and biopsy on 02/07/2015 revealed a extensive marrow involvement by recurrent hairy cell leukemia (approximately 80% of cells in the core). There was a monoclonal plasma cell infiltrate (approximately 10%), compatible with a plasma cell neoplasm. Marrow was hypercellular (40-50%) with markedly diminished residual trilineage hematopoiesis and mild multilineage dyspoiesis.  Peripheral smear revealed leukopenia (WBC 1900) with 2% atypical lymphoid cells compatible with hairy cell leukemia. There was moderate anemia with anisopoikilocytosis. 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  FISH studies for MDS were negative.  Cytogenetics were normal (46, XY).  He has an unclear allergy history.  He may be allergic to Primaxin, Zoloft, allopurinol, and a chemotherapy medication.  Reaction occurred in 06/2005.  He is allergic to sulfa.  He is not allergic to dapsone.  He requires washed RBCs.  He is on prophylactic acyclovir.  Dapsone and voriconazole were discontinued.  He has a history of shingles prior to prophylactic acyclovir.  He is 12 months status post cladribine (began 03/03/2015).  His counts plateaued on 07/25/2015 and have subsequently decreased.  Labs on 07/25/2015 revealed a normal B12 (894), folate (60.5), and TSH (2.562).  Ferritin was 52.  Bone marrow on 11/03/2015 revealed persistent plasma cell neoplasm, now with mildy atypical monoclonal plasma cells estimated at 20-30%.   There was no morphologic evidence of residual hairy cell leukemia.  There was a  minute population (0.03%) with hairy cell immunophenotype detected by flow.  Marrow was normocellular for age with relative erythroid hyperplasia, relative myeloid hypoplasia, mild dyspoiesis, adequate megakaryocytes and no increased blasts.  There was no significant increase in marrow reticulin fibers.  Iron was present.  FISH studies for MDS were negative.  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  Cytogenetics were normal (46, XY).  Bone marrow on 03/13/2016 was normocellular to marginally hypercellular for age (20-40%) with relative erythroid hyperplasia and mild dyserythropoiesis, decreased granulopoiesis and adequate megakaryocytes.  There was a persistent 15-20% plasma cell neoplasm.  There was no overt increase in reticulin fibrosis.  Storage iron was present.  Flow cytometry revealed abnormal/monocytic plasma cell population (3% sample), consistent with a plasma cell neoplastic process.  Cytogenetics were normal (46, XY).  FISH studies for myeloma revealed a CCND1/IGH translocation, t(11;14) in 53% of nuclei.  SPEP on 11/17/2015 revealed a 1.3 gm/dL monoclonal spike (1.4 gm/dL on 07/25/2015 and 1.0 on 02/03/2015).  Kappa free light chains were 50.51, lambda free light chains were 10.48 with a ratio of 4.82 (ratio 3.68 on 07/25/2015).  Beta2-microglobulin was 2.5.  24 hour urine on 11/21/2015 revealed 234.8 mg protein in 24 hours (no monoclonal protein).  Bone survey on 11/24/2015 revealed no lytic lesions.  PET scan on 03/14/2016 revealed no evidence of hypermetabolic lymphadenopathy or focal skeletal lesions to suggest marrow or cortical bone involvement.  Symptomatically, he has chronic fatigue.  Exam is stable.  Counts are slowly drifting down.  Plan: 1.  Labs today:  CBC with diff, CMP. 2.    Review bone marrow and PET scan.  No evidence of myeloma on PET scan.  Discuss no evidence of hairy cell leukemia on bone marrow.  Note persistent, but  possibly slightly smaller plasma cell population (15-20% as compared to 20-30%).  Discuss dyserythropoiesis suggestive of possible  evolving myelodysplasia.  Discuss prior conversation about trial of Revlimid.  Side effects reviewed.  Discuss checking an epo level assess potential response to ESA (level < 500). 2.  RTC tomorrow for labs (CBC with diff, epo level). 3.  Phone follow-up with Dr. [name] (previously seen for second opinion at [facility]) for joint decision regarding direction of therapy. 4.  MD to call patient treatment plan.  Addendum:  Spoke with Dr. Evelene Croon.  Treatment options include    Lequita Asal, MD  04/25/2016, 4:03 AM

## 2016-04-26 ENCOUNTER — Inpatient Hospital Stay: Payer: Medicare Other | Attending: Hematology and Oncology

## 2016-04-26 ENCOUNTER — Other Ambulatory Visit: Payer: Self-pay | Admitting: *Deleted

## 2016-04-26 ENCOUNTER — Other Ambulatory Visit: Payer: Self-pay

## 2016-04-26 ENCOUNTER — Inpatient Hospital Stay (HOSPITAL_BASED_OUTPATIENT_CLINIC_OR_DEPARTMENT_OTHER): Payer: Medicare Other | Admitting: Hematology and Oncology

## 2016-04-26 ENCOUNTER — Inpatient Hospital Stay: Payer: Medicare Other

## 2016-04-26 VITALS — BP 166/72 | HR 75 | Temp 97.7°F | Resp 18 | Wt 218.5 lb

## 2016-04-26 DIAGNOSIS — Z856 Personal history of leukemia: Secondary | ICD-10-CM | POA: Diagnosis not present

## 2016-04-26 DIAGNOSIS — R0602 Shortness of breath: Secondary | ICD-10-CM | POA: Diagnosis not present

## 2016-04-26 DIAGNOSIS — Z85828 Personal history of other malignant neoplasm of skin: Secondary | ICD-10-CM | POA: Diagnosis not present

## 2016-04-26 DIAGNOSIS — Z7984 Long term (current) use of oral hypoglycemic drugs: Secondary | ICD-10-CM | POA: Diagnosis not present

## 2016-04-26 DIAGNOSIS — E119 Type 2 diabetes mellitus without complications: Secondary | ICD-10-CM | POA: Insufficient documentation

## 2016-04-26 DIAGNOSIS — Z86010 Personal history of colonic polyps: Secondary | ICD-10-CM | POA: Diagnosis not present

## 2016-04-26 DIAGNOSIS — Z803 Family history of malignant neoplasm of breast: Secondary | ICD-10-CM | POA: Diagnosis not present

## 2016-04-26 DIAGNOSIS — I10 Essential (primary) hypertension: Secondary | ICD-10-CM

## 2016-04-26 DIAGNOSIS — N400 Enlarged prostate without lower urinary tract symptoms: Secondary | ICD-10-CM

## 2016-04-26 DIAGNOSIS — D72819 Decreased white blood cell count, unspecified: Secondary | ICD-10-CM | POA: Diagnosis not present

## 2016-04-26 DIAGNOSIS — C9030 Solitary plasmacytoma not having achieved remission: Secondary | ICD-10-CM | POA: Diagnosis not present

## 2016-04-26 DIAGNOSIS — E785 Hyperlipidemia, unspecified: Secondary | ICD-10-CM | POA: Insufficient documentation

## 2016-04-26 DIAGNOSIS — Z87891 Personal history of nicotine dependence: Secondary | ICD-10-CM | POA: Diagnosis not present

## 2016-04-26 DIAGNOSIS — C9141 Hairy cell leukemia, in remission: Secondary | ICD-10-CM

## 2016-04-26 DIAGNOSIS — Z7982 Long term (current) use of aspirin: Secondary | ICD-10-CM | POA: Insufficient documentation

## 2016-04-26 DIAGNOSIS — K219 Gastro-esophageal reflux disease without esophagitis: Secondary | ICD-10-CM | POA: Diagnosis not present

## 2016-04-26 DIAGNOSIS — Z79899 Other long term (current) drug therapy: Secondary | ICD-10-CM

## 2016-04-26 DIAGNOSIS — K5790 Diverticulosis of intestine, part unspecified, without perforation or abscess without bleeding: Secondary | ICD-10-CM | POA: Diagnosis not present

## 2016-04-26 DIAGNOSIS — D649 Anemia, unspecified: Secondary | ICD-10-CM | POA: Insufficient documentation

## 2016-04-26 DIAGNOSIS — Z8619 Personal history of other infectious and parasitic diseases: Secondary | ICD-10-CM | POA: Insufficient documentation

## 2016-04-26 DIAGNOSIS — E538 Deficiency of other specified B group vitamins: Secondary | ICD-10-CM | POA: Insufficient documentation

## 2016-04-26 DIAGNOSIS — D46Z Other myelodysplastic syndromes: Secondary | ICD-10-CM

## 2016-04-26 DIAGNOSIS — Z9221 Personal history of antineoplastic chemotherapy: Secondary | ICD-10-CM | POA: Insufficient documentation

## 2016-04-26 DIAGNOSIS — Z794 Long term (current) use of insulin: Secondary | ICD-10-CM | POA: Insufficient documentation

## 2016-04-26 DIAGNOSIS — C9000 Multiple myeloma not having achieved remission: Secondary | ICD-10-CM | POA: Insufficient documentation

## 2016-04-26 DIAGNOSIS — Z8701 Personal history of pneumonia (recurrent): Secondary | ICD-10-CM | POA: Insufficient documentation

## 2016-04-26 DIAGNOSIS — D539 Nutritional anemia, unspecified: Secondary | ICD-10-CM

## 2016-04-26 LAB — COMPREHENSIVE METABOLIC PANEL
ALT: 20 U/L (ref 17–63)
AST: 25 U/L (ref 15–41)
Albumin: 4.1 g/dL (ref 3.5–5.0)
Alkaline Phosphatase: 59 U/L (ref 38–126)
Anion gap: 5 (ref 5–15)
BUN: 29 mg/dL — ABNORMAL HIGH (ref 6–20)
CO2: 22 mmol/L (ref 22–32)
Calcium: 8.7 mg/dL — ABNORMAL LOW (ref 8.9–10.3)
Chloride: 108 mmol/L (ref 101–111)
Creatinine, Ser: 1.28 mg/dL — ABNORMAL HIGH (ref 0.61–1.24)
GFR calc Af Amer: 60 mL/min — ABNORMAL LOW (ref >60)
GFR calc non Af Amer: 52 mL/min — ABNORMAL LOW (ref >60)
Glucose, Bld: 266 mg/dL — ABNORMAL HIGH (ref 65–99)
Potassium: 4.3 mmol/L (ref 3.5–5.1)
Sodium: 135 mmol/L (ref 135–145)
Total Bilirubin: 0.6 mg/dL (ref 0.3–1.2)
Total Protein: 7.3 g/dL (ref 6.5–8.1)

## 2016-04-26 LAB — CBC WITH DIFFERENTIAL/PLATELET
Basophils Absolute: 0 10*3/uL (ref 0–0.1)
Basophils Relative: 1 %
Eosinophils Absolute: 0.1 10*3/uL (ref 0–0.7)
Eosinophils Relative: 4 %
HCT: 25.5 % — ABNORMAL LOW (ref 40.0–52.0)
Hemoglobin: 9.1 g/dL — ABNORMAL LOW (ref 13.0–18.0)
Lymphocytes Relative: 23 %
Lymphs Abs: 0.5 10*3/uL — ABNORMAL LOW (ref 1.0–3.6)
MCH: 39.2 pg — ABNORMAL HIGH (ref 26.0–34.0)
MCHC: 35.5 g/dL (ref 32.0–36.0)
MCV: 110.4 fL — ABNORMAL HIGH (ref 80.0–100.0)
Monocytes Absolute: 0.2 10*3/uL (ref 0.2–1.0)
Monocytes Relative: 7 %
Neutro Abs: 1.5 10*3/uL (ref 1.4–6.5)
Neutrophils Relative %: 65 %
Platelets: 99 10*3/uL — ABNORMAL LOW (ref 150–440)
RBC: 2.31 MIL/uL — ABNORMAL LOW (ref 4.40–5.90)
RDW: 15.3 % — ABNORMAL HIGH (ref 11.5–14.5)
WBC: 2.2 10*3/uL — ABNORMAL LOW (ref 3.8–10.6)

## 2016-04-26 LAB — IRON AND TIBC
Iron: 102 ug/dL (ref 45–182)
Saturation Ratios: 26 % (ref 17.9–39.5)
TIBC: 391 ug/dL (ref 250–450)
UIBC: 289 ug/dL

## 2016-04-26 LAB — FOLATE: Folate: 38 ng/mL (ref >5.9)

## 2016-04-26 LAB — VITAMIN B12: Vitamin B-12: 765 pg/mL (ref 180–914)

## 2016-04-26 LAB — FERRITIN: Ferritin: 50 ng/mL (ref 24–336)

## 2016-04-26 MED ORDER — EPOETIN ALFA 10000 UNIT/ML IJ SOLN
10000.0000 [IU] | Freq: Once | INTRAMUSCULAR | Status: AC
  Administered 2016-04-26: 10000 [IU] via SUBCUTANEOUS
  Filled 2016-04-26: qty 2

## 2016-04-26 NOTE — Progress Notes (Signed)
BP 166/72. Nurse called MD who stated she wants to continue treatment.

## 2016-04-26 NOTE — Progress Notes (Signed)
[facility] day:  04/26/16  Chief Complaint: Alex Smith is a 81 y.o. male with a myelodysplastic marrow and plasma cell neoplasm who is seen assessment prior to initiation of Procrit.  HPI:  The patient was last seen in the medical oncology clinic on 03/28/2016.  At that time, bone marrow biopsy and PET scan were reviewed.  Marrow revealed no evidence of hairy cell leukemia.  There were myelodysplatic changes in the marrow.  The plasma cell neoplasm was slightly decreased (15-20% compared to 20-30%).  He had previously seen Dr. [name] at [facility].  We discussed a trial of an ESA given his symptomatic anemia.  Symptomatically, he feels the same.  He missed the calls for return to clinic as his wife left the phone off of the hook.  He notes responding to Procrit in 2006.   Past Medical History:  Diagnosis Date  . Anemia   . B12 deficiency   . Basal cell carcinoma of face   . BPH (benign prostatic hypertrophy)    followed by urology, discharged (Dr. Bernardo Heater)  . CAP (community acquired pneumonia) 02/15/2015  . Colon polyps   . Diverticulosis   . GERD (gastroesophageal reflux disease)   . 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 History of pneumonia 2000's   "once" (07/07/2012)  . History of shingles   . HLD (hyperlipidemia)   . Hypertension   . Pneumonia   . Shortness of breath dyspnea   . Type 2 diabetes, controlled, with retinopathy ([place])     Past Surgical History:  Procedure Laterality Date  . [place] VITRECTOMY WITH 20 GAUGE MVR PORT FOR MACULAR HOLE  07/07/2012   Procedure: 25 GAUGE PARS PLANA VITRECTOMY WITH 20 GAUGE MVR PORT FOR MACULAR HOLE;  Surgeon: Hayden Pedro, MD;  Location: [place];  Service: Ophthalmology;  Laterality: Left;  . BONE MARROW BIOPSY  2016  . CARDIOVASCULAR STRESS TEST  2013   treadmill - no evidence ischemia, EF 61%  . CATARACT  EXTRACTION W/ INTRAOCULAR LENS  IMPLANT, BILATERAL  ~ 2010  . COLONOSCOPY  2014   [place] WNL no rpt needed, h/o polyps  . EYE SURGERY Left 06/2012   laser surgery  . GAS INSERTION  07/07/2012   Procedure: INSERTION OF GAS;  Surgeon: Hayden Pedro, MD;  Location: [place];  Service: Ophthalmology;  Laterality: Left;  [id number]  . PERIPHERAL VASCULAR CATHETERIZATION N/A 02/23/2015   Procedure: Glori Luis Cath Insertion;  Surgeon: Algernon Huxley, MD;  Location: [place] [facility];  Service: Cardiovascular;  Laterality: N/A;  . SERUM PATCH  07/07/2012   Procedure: SERUM PATCH;  Surgeon: Hayden Pedro, MD;  Location: [place];  Service: Ophthalmology;  Laterality: Left;  . SKIN CANCER EXCISION     "all over my face" (07/07/2012)    Family History  Problem Relation Age of Onset  . Dementia Mother   . Heart failure Father 5  . Cancer Sister     breast  . Diabetes Paternal Uncle   . Diabetes Paternal Aunt   . CAD Brother 76    MI  . Stroke Neg Hx     Social History:  reports that he quit smoking about 47 years ago. His smoking use included Cigarettes. He has a 50.00 pack-year smoking history. He has never used smokeless tobacco. He reports that he drinks alcohol. He reports that he  does not use drugs.  He works at [employer] 20-32 hours/week.  The patient 's wife has had 3 hip operations.  She underwent hip replacement 03/06/2016.  He takes care of his wife.  He states that they either eat out or bring food in.  His best contact number is his cell: [phone].  He is alone today.  Allergies:  Allergies  Allergen Reactions  . Rituximab Rash    Chest tightness  . Blood-Group Specific Substance Other (See Comments)    Had a post transfusion reaction of red blood cells; NOW REQUIRES WASHED BLOOD CELLS  . Primaxin [Imipenem] Other (See Comments)    Possible allergy  . Sulfa Antibiotics Itching and Rash  Possible allergy to Zoloft, allopurinol, and a chemotherapy medication.  He tolerates  Dapsone.  Current Medications: Current Outpatient Prescriptions  Medication Sig Dispense Refill  . acyclovir (ZOVIRAX) 400 MG tablet TAKE 2 TABLETS BY MOUTH TWICE DAILY 120 tablet 3  . aspirin EC 81 MG tablet Take 81 mg by mouth daily.    Marland Kitchen glucose blood (ACCU-CHEK AVIVA PLUS) test strip Use to check sugar twice daily Dx:E11.9 200 each 3  . HUMULIN R 100 UNIT/ML injection INJCET 10 UNITS SUBCUTANEOUSLY TWICE DAILY BEFORE MEALS 10 mL 11  . hydrochlorothiazide (HYDRODIURIL) 25 MG tablet TAKE ONE TABLET EVERY DAY 30 tablet 11  . insulin NPH Human (HUMULIN N) 100 UNIT/ML injection Inject 0.35 mLs (35 Units total) into the skin 2 (two) times daily before a meal. 20 mL 11  . lidocaine-prilocaine (EMLA) cream Apply 1 application topically as needed. 30 g 3  . lisinopril (PRINIVIL,ZESTRIL) 20 MG tablet TAKE ONE TABLET TWICE DAILY 60 tablet 6  . metFORMIN (GLUCOPHAGE) 1000 MG tablet TAKE ONE TABLET BY MOUTH TWICE DAILY AS DIRECTED 180 tablet 3  . MICROLET LANCETS MISC USE AS DIRECTED 200 each 3  . pravastatin (PRAVACHOL) 20 MG tablet Take 1 tablet (20 mg total) by mouth at bedtime. 90 tablet 2  . ranitidine (ZANTAC) 150 MG tablet Take 1 tablet (150 mg total) by mouth 2 (two) times daily as needed. 180 tablet 3  . tamsulosin (FLOMAX) 0.4 MG CAPS capsule Take 0.4 mg by mouth daily.    . temazepam (RESTORIL) 7.5 MG capsule Take 7.5 mg by mouth once.     . traZODone (DESYREL) 50 MG tablet Take 0.5-1 tablets (25-50 mg total) by mouth at bedtime as needed for sleep. 30 tablet 3  . vitamin B-12 (CYANOCOBALAMIN) 1000 MCG tablet Take 1,000 mcg by mouth daily.    . vitamin E 400 UNIT capsule Take 200 Units by mouth daily.      No current facility-administered medications for this visit.    Facility-Administered Medications Ordered in Other Visits  Medication Dose Route Frequency Provider Last Rate Last Dose  . heparin lock flush 100 unit/mL  500 Units Intravenous Once Lequita Asal, MD      . sodium  chloride 0.9 % injection 10 mL  10 mL Intravenous PRN Lequita Asal, MD   10 mL at 03/03/15 0903  . sodium chloride 0.9 % injection 10 mL  10 mL Intracatheter PRN Lequita Asal, MD   10 mL at 03/10/15 1410   Review of Systems:  GENERAL:  Fatigue. No fevers or sweats.  Weight up 2 pounds. PERFORMANCE STATUS (ECOG):  1 HEENT: No visual changes, runny nose, sore throat. Pulmonary:  No shortness of breath or cough.  No hemoptysis. Cardiac:  No chest pain, palpitations, orthopnea, or  PND. GI:  Appetite 75%.  No nausea, vomiting, diarrhea, constipation, melena or hematochezia. GU:  No urgency, frequency, dysuria, or hematuria. Musculoskeletal:  No back pain.  No joint pain.  No muscle tenderness. Extremities:  No pain or swelling. Skin:  No rashes or skin changes. Neuro:  No headache, numbness or weakness, balance or coordination issues. Endocrine:  Diabetes.  No thyroid issues, hot flashes or night sweats. Psych:  No mood changes, depression or anxiety.  Poor sleep. Pain:  No focal pain. Review of systems:  All other systems reviewed and found to be negative.   Physical Exam: Blood pressure (!) 166/72, pulse 75, temperature 97.7 F (36.5 C), temperature source Tympanic, resp. rate 18, weight 218 lb 7.6 oz (99.1 kg).  GENERAL:  Well developed, well nourished, gentleman sitting comfortably in the exam room in no acute distress. MENTAL STATUS:  Alert and oriented to person, place and time. HEAD:  Pearline Cables short hair.  Normocephalic, atraumatic, face symmetric, no Cushingoid features. EYES:  Glasses. Blue eyes.  Pupils equal round and reactive to light and accomodation.  No conjunctivitis or scleral icterus. ENT:  Oropharynx clear without lesion.  Tongue normal. Mucous membranes moist.  RESPIRATORY:  Clear to auscultation without rales, wheezes or rhonchi. CARDIOVASCULAR:  Regular rate and rhythm without murmur, rub or gallop. ABDOMEN:  Soft, non-tender, with active bowel sounds, and no  hepatosplenomegaly.  No masses. SKIN:  No rashes, ulcers or lesions. EXTREMITIES: No edema, no skin discoloration or tenderness.  No palpable cords. LYMPH NODES: No palpable cervical, supraclavicular, axillary or inguinal adenopathy  NEUROLOGICAL: Unremarkable. PSYCH:  Appropriate.    Orders Only on 04/26/2016  Component Date Value Ref Range Status  . WBC 04/26/2016 2.2* 3.8 - 10.6 K/uL Final  . RBC 04/26/2016 2.31* 4.40 - 5.90 MIL/uL Final  . Hemoglobin 04/26/2016 9.1* 13.0 - 18.0 g/dL Final  . HCT 04/26/2016 25.5* 40.0 - 52.0 % Final  . MCV 04/26/2016 110.4* 80.0 - 100.0 fL Final  . MCH 04/26/2016 39.2* 26.0 - 34.0 pg Final  . MCHC 04/26/2016 35.5  32.0 - 36.0 g/dL Final  . RDW 04/26/2016 15.3* 11.5 - 14.5 % Final  . Platelets 04/26/2016 99* 150 - 440 K/uL Final  . Neutrophils Relative % 04/26/2016 65%  % Final  . Neutro Abs 04/26/2016 1.5  1.4 - 6.5 K/uL Final  . Lymphocytes Relative 04/26/2016 23%  % Final  . Lymphs Abs 04/26/2016 0.5* 1.0 - 3.6 K/uL Final  . Monocytes Relative 04/26/2016 7%  % Final  . Monocytes Absolute 04/26/2016 0.2  0.2 - 1.0 K/uL Final  . Eosinophils Relative 04/26/2016 4%  % Final  . Eosinophils Absolute 04/26/2016 0.1  0 - 0.7 K/uL Final  . Basophils Relative 04/26/2016 1%  % Final  . Basophils Absolute 04/26/2016 0.0  0 - 0.1 K/uL Final  . Sodium 04/26/2016 135  135 - 145 mmol/L Final  . Potassium 04/26/2016 4.3  3.5 - 5.1 mmol/L Final  . Chloride 04/26/2016 108  101 - 111 mmol/L Final  . CO2 04/26/2016 22  22 - 32 mmol/L Final  . Glucose, Bld 04/26/2016 266* 65 - 99 mg/dL Final  . BUN 04/26/2016 29* 6 - 20 mg/dL Final  . Creatinine, Ser 04/26/2016 1.28* 0.61 - 1.24 mg/dL Final  . Calcium 04/26/2016 8.7* 8.9 - 10.3 mg/dL Final  . Total Protein 04/26/2016 7.3  6.5 - 8.1 g/dL Final  . Albumin 04/26/2016 4.1  3.5 - 5.0 g/dL Final  . AST 04/26/2016 25  15 - 41 U/L Final  . ALT 04/26/2016 20  17 - 63 U/L Final  . Alkaline Phosphatase 04/26/2016 59  38  - 126 U/L Final  . Total Bilirubin 04/26/2016 0.6  0.3 - 1.2 mg/dL Final  . GFR calc non Af Amer 04/26/2016 52* >60 mL/min Final  . GFR calc Af Amer 04/26/2016 60* >60 mL/min Final   Comment: (NOTE) The eGFR has been calculated using the CKD EPI equation. This calculation has not been validated in all clinical situations. eGFR's persistently <60 mL/min signify possible Chronic Kidney Disease.   . Anion gap 04/26/2016 5  5 - 15 Final    Assessment:  Alex BARTOLO is a 81 y.o. 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  He was diagnosed with hairy cell leukemia in 2006.  He received cladribine 11/06-11/05/2005.  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   Bone marrow aspirate and biopsy on 02/07/2015 revealed a extensive marrow involvement by recurrent hairy cell leukemia (approximately 80% of cells in the core). There was a monoclonal plasma cell infiltrate (approximately 10%), compatible with a plasma cell neoplasm. Marrow was hypercellular (40-50%) with markedly diminished residual trilineage hematopoiesis and mild multilineage dyspoiesis.  Peripheral smear revealed leukopenia (WBC 1900) with 2% atypical lymphoid cells compatible with hairy cell leukemia. There was moderate anemia with anisopoikilocytosis. 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  FISH studies for MDS were negative.  Cytogenetics were normal (46, XY).  He has an unclear allergy history.  He may be allergic to Primaxin, Zoloft, allopurinol, and a chemotherapy medication.  Reaction occurred in 06/2005.  He is allergic to sulfa.  He is not allergic to dapsone.  He requires washed RBCs.  He is on prophylactic acyclovir.  Dapsone and voriconazole were discontinued.  He has a history of shingles prior to prophylactic acyclovir.  He is 13 months status post cladribine (began 03/03/2015).  His counts plateaued on 07/25/2015 and  have subsequently decreased.  Labs on 07/25/2015 revealed a normal B12 (894), folate (60.5), and TSH (2.562).  Ferritin was 52.  Bone marrow on 11/03/2015 revealed persistent plasma cell neoplasm, now with mildy atypical monoclonal plasma cells estimated at 20-30%.   There was no morphologic evidence of residual hairy cell leukemia.  There was a minute population (0.03%) with hairy cell immunophenotype detected by flow.  Marrow was normocellular for age with relative erythroid hyperplasia, relative myeloid hypoplasia, mild dyspoiesis, adequate megakaryocytes and no increased blasts.  There was no significant increase in marrow reticulin fibers.  Iron was present.  FISH studies for MDS were negative.  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  Cytogenetics were normal (46, XY).  Bone marrow on 03/13/2016 was normocellular to marginally hypercellular for age (20-40%) with relative erythroid hyperplasia and mild dyserythropoiesis, decreased granulopoiesis and adequate megakaryocytes.  There was a persistent 15-20% plasma cell neoplasm.  There was no overt increase in reticulin fibrosis.  Storage iron was present.  Flow cytometry revealed abnormal/monocytic plasma cell population (3% sample), consistent with a plasma cell neoplastic process.  Cytogenetics were normal (46, XY).  FISH studies for myeloma revealed a CCND1/IGH translocation, t(11;14) in 53% of nuclei.  SPEP on 11/17/2015 revealed a 1.3 gm/dL monoclonal spike (1.4 gm/dL on 07/25/2015 and 1.0 on 02/03/2015).  Kappa free light chains were 50.51, lambda free light chains were 10.48 with a ratio of 4.82 (ratio 3.68 on 07/25/2015).  Beta2-microglobulin was 2.5.  24 hour urine on 11/21/2015 revealed 234.8 mg protein in 24 hours (no monoclonal protein).  Bone survey on 11/24/2015 revealed no lytic lesions.  Epo level was 329.6 on 03/29/2016.  PET scan on 03/14/2016 revealed no evidence of hypermetabolic  lymphadenopathy or focal skeletal lesions to suggest marrow or cortical bone involvement.  Symptomatically, he has chronic fatigue.  Exam is stable.  Counts are stable.  Plan: 1.  Labs today:  CBC with diff, CMP, ferritin, iron studies, B12, folate. 2.  Discuss initiation of Procrit with advancement of dose based on response. 3.  Begin Procrit 10,000 units SQ q week. 4.  RTC weekly for Hgb +/- Procrit. 5.  RCT in 1 month for MD assess and labs (CBC with diff) +/- Procrit.   Lequita Asal, MD  04/26/2016, 10:13 AM

## 2016-04-26 NOTE — Progress Notes (Signed)
BP taken at Med Onc appt was 166/72.  Spoke with Dr. [name] OK with giving Procrit today.  Did recheck BP when taken back to infusion area and it was 166/73.  Patient reports that he gets bp readings in the normal range at home.

## 2016-04-26 NOTE — Progress Notes (Signed)
Patient states he feels sluggish.  Appetite decreased. Not been sleeping as well.

## 2016-04-28 ENCOUNTER — Encounter: Payer: Self-pay | Admitting: Hematology and Oncology

## 2016-05-03 ENCOUNTER — Inpatient Hospital Stay: Payer: Medicare Other

## 2016-05-03 VITALS — BP 134/69 | HR 72 | Resp 18

## 2016-05-03 DIAGNOSIS — I10 Essential (primary) hypertension: Secondary | ICD-10-CM | POA: Diagnosis not present

## 2016-05-03 DIAGNOSIS — Z87891 Personal history of nicotine dependence: Secondary | ICD-10-CM | POA: Diagnosis not present

## 2016-05-03 DIAGNOSIS — D649 Anemia, unspecified: Secondary | ICD-10-CM | POA: Diagnosis not present

## 2016-05-03 DIAGNOSIS — C9141 Hairy cell leukemia, in remission: Secondary | ICD-10-CM

## 2016-05-03 DIAGNOSIS — Z86010 Personal history of colonic polyps: Secondary | ICD-10-CM | POA: Diagnosis not present

## 2016-05-03 DIAGNOSIS — Z7982 Long term (current) use of aspirin: Secondary | ICD-10-CM | POA: Diagnosis not present

## 2016-05-03 DIAGNOSIS — K5790 Diverticulosis of intestine, part unspecified, without perforation or abscess without bleeding: Secondary | ICD-10-CM | POA: Diagnosis not present

## 2016-05-03 DIAGNOSIS — C9030 Solitary plasmacytoma not having achieved remission: Secondary | ICD-10-CM | POA: Diagnosis not present

## 2016-05-03 DIAGNOSIS — Z79899 Other long term (current) drug therapy: Secondary | ICD-10-CM | POA: Diagnosis not present

## 2016-05-03 DIAGNOSIS — E119 Type 2 diabetes mellitus without complications: Secondary | ICD-10-CM | POA: Diagnosis not present

## 2016-05-03 DIAGNOSIS — Z8619 Personal history of other infectious and parasitic diseases: Secondary | ICD-10-CM | POA: Diagnosis not present

## 2016-05-03 DIAGNOSIS — Z7984 Long term (current) use of oral hypoglycemic drugs: Secondary | ICD-10-CM | POA: Diagnosis not present

## 2016-05-03 DIAGNOSIS — Z856 Personal history of leukemia: Secondary | ICD-10-CM | POA: Diagnosis not present

## 2016-05-03 DIAGNOSIS — E538 Deficiency of other specified B group vitamins: Secondary | ICD-10-CM | POA: Diagnosis not present

## 2016-05-03 DIAGNOSIS — C9000 Multiple myeloma not having achieved remission: Secondary | ICD-10-CM | POA: Diagnosis not present

## 2016-05-03 DIAGNOSIS — Z794 Long term (current) use of insulin: Secondary | ICD-10-CM | POA: Diagnosis not present

## 2016-05-03 DIAGNOSIS — D72819 Decreased white blood cell count, unspecified: Secondary | ICD-10-CM | POA: Diagnosis not present

## 2016-05-03 DIAGNOSIS — Z9221 Personal history of antineoplastic chemotherapy: Secondary | ICD-10-CM | POA: Diagnosis not present

## 2016-05-03 DIAGNOSIS — E785 Hyperlipidemia, unspecified: Secondary | ICD-10-CM | POA: Diagnosis not present

## 2016-05-03 DIAGNOSIS — Z803 Family history of malignant neoplasm of breast: Secondary | ICD-10-CM | POA: Diagnosis not present

## 2016-05-03 DIAGNOSIS — Z85828 Personal history of other malignant neoplasm of skin: Secondary | ICD-10-CM | POA: Diagnosis not present

## 2016-05-03 DIAGNOSIS — N400 Enlarged prostate without lower urinary tract symptoms: Secondary | ICD-10-CM | POA: Diagnosis not present

## 2016-05-03 DIAGNOSIS — Z95828 Presence of other vascular implants and grafts: Secondary | ICD-10-CM

## 2016-05-03 DIAGNOSIS — D46Z Other myelodysplastic syndromes: Secondary | ICD-10-CM

## 2016-05-03 DIAGNOSIS — K219 Gastro-esophageal reflux disease without esophagitis: Secondary | ICD-10-CM | POA: Diagnosis not present

## 2016-05-03 DIAGNOSIS — R0602 Shortness of breath: Secondary | ICD-10-CM | POA: Diagnosis not present

## 2016-05-03 LAB — HEMOGLOBIN: Hemoglobin: 8.4 g/dL — ABNORMAL LOW (ref 13.0–18.0)

## 2016-05-03 MED ORDER — EPOETIN ALFA 10000 UNIT/ML IJ SOLN
10000.0000 [IU] | Freq: Once | INTRAMUSCULAR | Status: AC
  Administered 2016-05-03: 10000 [IU] via SUBCUTANEOUS
  Filled 2016-05-03: qty 2

## 2016-05-03 MED ORDER — HEPARIN SOD (PORK) LOCK FLUSH 100 UNIT/ML IV SOLN
500.0000 [IU] | Freq: Once | INTRAVENOUS | Status: AC
  Administered 2016-05-03: 500 [IU] via INTRAVENOUS

## 2016-05-03 MED ORDER — SODIUM CHLORIDE 0.9% FLUSH
10.0000 mL | INTRAVENOUS | Status: DC | PRN
  Administered 2016-05-03: 10 mL via INTRAVENOUS
  Filled 2016-05-03: qty 10

## 2016-05-08 DIAGNOSIS — D485 Neoplasm of uncertain behavior of skin: Secondary | ICD-10-CM | POA: Diagnosis not present

## 2016-05-08 DIAGNOSIS — L570 Actinic keratosis: Secondary | ICD-10-CM | POA: Diagnosis not present

## 2016-05-08 DIAGNOSIS — C44311 Basal cell carcinoma of skin of nose: Secondary | ICD-10-CM | POA: Diagnosis not present

## 2016-05-08 DIAGNOSIS — Z85828 Personal history of other malignant neoplasm of skin: Secondary | ICD-10-CM | POA: Diagnosis not present

## 2016-05-10 ENCOUNTER — Other Ambulatory Visit: Payer: Self-pay

## 2016-05-10 ENCOUNTER — Inpatient Hospital Stay: Payer: Medicare Other

## 2016-05-10 VITALS — BP 150/68 | HR 100 | Resp 18

## 2016-05-10 DIAGNOSIS — Z803 Family history of malignant neoplasm of breast: Secondary | ICD-10-CM | POA: Diagnosis not present

## 2016-05-10 DIAGNOSIS — I10 Essential (primary) hypertension: Secondary | ICD-10-CM | POA: Diagnosis not present

## 2016-05-10 DIAGNOSIS — K219 Gastro-esophageal reflux disease without esophagitis: Secondary | ICD-10-CM | POA: Diagnosis not present

## 2016-05-10 DIAGNOSIS — Z85828 Personal history of other malignant neoplasm of skin: Secondary | ICD-10-CM | POA: Diagnosis not present

## 2016-05-10 DIAGNOSIS — Z7984 Long term (current) use of oral hypoglycemic drugs: Secondary | ICD-10-CM | POA: Diagnosis not present

## 2016-05-10 DIAGNOSIS — Z86010 Personal history of colonic polyps: Secondary | ICD-10-CM | POA: Diagnosis not present

## 2016-05-10 DIAGNOSIS — Z8619 Personal history of other infectious and parasitic diseases: Secondary | ICD-10-CM | POA: Diagnosis not present

## 2016-05-10 DIAGNOSIS — Z87891 Personal history of nicotine dependence: Secondary | ICD-10-CM | POA: Diagnosis not present

## 2016-05-10 DIAGNOSIS — E785 Hyperlipidemia, unspecified: Secondary | ICD-10-CM | POA: Diagnosis not present

## 2016-05-10 DIAGNOSIS — C9141 Hairy cell leukemia, in remission: Secondary | ICD-10-CM

## 2016-05-10 DIAGNOSIS — Z9221 Personal history of antineoplastic chemotherapy: Secondary | ICD-10-CM | POA: Diagnosis not present

## 2016-05-10 DIAGNOSIS — D46Z Other myelodysplastic syndromes: Secondary | ICD-10-CM

## 2016-05-10 DIAGNOSIS — Z79899 Other long term (current) drug therapy: Secondary | ICD-10-CM | POA: Diagnosis not present

## 2016-05-10 DIAGNOSIS — E538 Deficiency of other specified B group vitamins: Secondary | ICD-10-CM | POA: Diagnosis not present

## 2016-05-10 DIAGNOSIS — Z794 Long term (current) use of insulin: Secondary | ICD-10-CM | POA: Diagnosis not present

## 2016-05-10 DIAGNOSIS — Z7982 Long term (current) use of aspirin: Secondary | ICD-10-CM | POA: Diagnosis not present

## 2016-05-10 DIAGNOSIS — D649 Anemia, unspecified: Secondary | ICD-10-CM | POA: Diagnosis not present

## 2016-05-10 DIAGNOSIS — C9030 Solitary plasmacytoma not having achieved remission: Secondary | ICD-10-CM | POA: Diagnosis not present

## 2016-05-10 DIAGNOSIS — K5790 Diverticulosis of intestine, part unspecified, without perforation or abscess without bleeding: Secondary | ICD-10-CM | POA: Diagnosis not present

## 2016-05-10 DIAGNOSIS — C9000 Multiple myeloma not having achieved remission: Secondary | ICD-10-CM | POA: Diagnosis not present

## 2016-05-10 DIAGNOSIS — D72819 Decreased white blood cell count, unspecified: Secondary | ICD-10-CM | POA: Diagnosis not present

## 2016-05-10 DIAGNOSIS — E119 Type 2 diabetes mellitus without complications: Secondary | ICD-10-CM | POA: Diagnosis not present

## 2016-05-10 DIAGNOSIS — Z856 Personal history of leukemia: Secondary | ICD-10-CM | POA: Diagnosis not present

## 2016-05-10 DIAGNOSIS — N400 Enlarged prostate without lower urinary tract symptoms: Secondary | ICD-10-CM | POA: Diagnosis not present

## 2016-05-10 DIAGNOSIS — R0602 Shortness of breath: Secondary | ICD-10-CM | POA: Diagnosis not present

## 2016-05-10 LAB — HEMOGLOBIN: Hemoglobin: 9.1 g/dL — ABNORMAL LOW (ref 13.0–18.0)

## 2016-05-10 MED ORDER — EPOETIN ALFA 10000 UNIT/ML IJ SOLN
10000.0000 [IU] | Freq: Once | INTRAMUSCULAR | Status: AC
  Administered 2016-05-10: 10000 [IU] via SUBCUTANEOUS
  Filled 2016-05-10: qty 2

## 2016-05-17 ENCOUNTER — Inpatient Hospital Stay: Payer: Medicare Other

## 2016-05-17 VITALS — BP 158/68 | HR 80 | Resp 18

## 2016-05-17 DIAGNOSIS — Z8619 Personal history of other infectious and parasitic diseases: Secondary | ICD-10-CM | POA: Diagnosis not present

## 2016-05-17 DIAGNOSIS — E785 Hyperlipidemia, unspecified: Secondary | ICD-10-CM | POA: Diagnosis not present

## 2016-05-17 DIAGNOSIS — N400 Enlarged prostate without lower urinary tract symptoms: Secondary | ICD-10-CM | POA: Diagnosis not present

## 2016-05-17 DIAGNOSIS — Z85828 Personal history of other malignant neoplasm of skin: Secondary | ICD-10-CM | POA: Diagnosis not present

## 2016-05-17 DIAGNOSIS — C9141 Hairy cell leukemia, in remission: Secondary | ICD-10-CM

## 2016-05-17 DIAGNOSIS — Z86010 Personal history of colonic polyps: Secondary | ICD-10-CM | POA: Diagnosis not present

## 2016-05-17 DIAGNOSIS — Z7982 Long term (current) use of aspirin: Secondary | ICD-10-CM | POA: Diagnosis not present

## 2016-05-17 DIAGNOSIS — Z803 Family history of malignant neoplasm of breast: Secondary | ICD-10-CM | POA: Diagnosis not present

## 2016-05-17 DIAGNOSIS — R0602 Shortness of breath: Secondary | ICD-10-CM | POA: Diagnosis not present

## 2016-05-17 DIAGNOSIS — K219 Gastro-esophageal reflux disease without esophagitis: Secondary | ICD-10-CM | POA: Diagnosis not present

## 2016-05-17 DIAGNOSIS — D72819 Decreased white blood cell count, unspecified: Secondary | ICD-10-CM | POA: Diagnosis not present

## 2016-05-17 DIAGNOSIS — Z79899 Other long term (current) drug therapy: Secondary | ICD-10-CM | POA: Diagnosis not present

## 2016-05-17 DIAGNOSIS — Z87891 Personal history of nicotine dependence: Secondary | ICD-10-CM | POA: Diagnosis not present

## 2016-05-17 DIAGNOSIS — D649 Anemia, unspecified: Secondary | ICD-10-CM | POA: Diagnosis not present

## 2016-05-17 DIAGNOSIS — D46Z Other myelodysplastic syndromes: Secondary | ICD-10-CM

## 2016-05-17 DIAGNOSIS — Z7984 Long term (current) use of oral hypoglycemic drugs: Secondary | ICD-10-CM | POA: Diagnosis not present

## 2016-05-17 DIAGNOSIS — E119 Type 2 diabetes mellitus without complications: Secondary | ICD-10-CM | POA: Diagnosis not present

## 2016-05-17 DIAGNOSIS — C9000 Multiple myeloma not having achieved remission: Secondary | ICD-10-CM | POA: Diagnosis not present

## 2016-05-17 DIAGNOSIS — E538 Deficiency of other specified B group vitamins: Secondary | ICD-10-CM | POA: Diagnosis not present

## 2016-05-17 DIAGNOSIS — Z856 Personal history of leukemia: Secondary | ICD-10-CM | POA: Diagnosis not present

## 2016-05-17 DIAGNOSIS — I10 Essential (primary) hypertension: Secondary | ICD-10-CM | POA: Diagnosis not present

## 2016-05-17 DIAGNOSIS — Z9221 Personal history of antineoplastic chemotherapy: Secondary | ICD-10-CM | POA: Diagnosis not present

## 2016-05-17 DIAGNOSIS — K5790 Diverticulosis of intestine, part unspecified, without perforation or abscess without bleeding: Secondary | ICD-10-CM | POA: Diagnosis not present

## 2016-05-17 DIAGNOSIS — C9030 Solitary plasmacytoma not having achieved remission: Secondary | ICD-10-CM | POA: Diagnosis not present

## 2016-05-17 DIAGNOSIS — Z794 Long term (current) use of insulin: Secondary | ICD-10-CM | POA: Diagnosis not present

## 2016-05-17 LAB — HEMOGLOBIN: Hemoglobin: 8.9 g/dL — ABNORMAL LOW (ref 13.0–18.0)

## 2016-05-17 MED ORDER — EPOETIN ALFA 10000 UNIT/ML IJ SOLN
10000.0000 [IU] | Freq: Once | INTRAMUSCULAR | Status: AC
  Administered 2016-05-17: 10000 [IU] via SUBCUTANEOUS
  Filled 2016-05-17: qty 2

## 2016-05-20 DIAGNOSIS — Z85828 Personal history of other malignant neoplasm of skin: Secondary | ICD-10-CM | POA: Diagnosis not present

## 2016-05-20 DIAGNOSIS — E119 Type 2 diabetes mellitus without complications: Secondary | ICD-10-CM | POA: Diagnosis not present

## 2016-05-20 DIAGNOSIS — E7800 Pure hypercholesterolemia, unspecified: Secondary | ICD-10-CM | POA: Diagnosis not present

## 2016-05-20 DIAGNOSIS — R0602 Shortness of breath: Secondary | ICD-10-CM | POA: Diagnosis not present

## 2016-05-20 DIAGNOSIS — D539 Nutritional anemia, unspecified: Secondary | ICD-10-CM | POA: Diagnosis not present

## 2016-05-20 DIAGNOSIS — M25552 Pain in left hip: Secondary | ICD-10-CM | POA: Diagnosis not present

## 2016-05-20 DIAGNOSIS — Z794 Long term (current) use of insulin: Secondary | ICD-10-CM | POA: Diagnosis not present

## 2016-05-20 DIAGNOSIS — N183 Chronic kidney disease, stage 3 (moderate): Secondary | ICD-10-CM | POA: Diagnosis not present

## 2016-05-20 DIAGNOSIS — Z7982 Long term (current) use of aspirin: Secondary | ICD-10-CM | POA: Diagnosis not present

## 2016-05-20 DIAGNOSIS — D469 Myelodysplastic syndrome, unspecified: Secondary | ICD-10-CM | POA: Diagnosis not present

## 2016-05-20 DIAGNOSIS — R5383 Other fatigue: Secondary | ICD-10-CM | POA: Diagnosis not present

## 2016-05-20 DIAGNOSIS — Z8249 Family history of ischemic heart disease and other diseases of the circulatory system: Secondary | ICD-10-CM | POA: Diagnosis not present

## 2016-05-20 DIAGNOSIS — E538 Deficiency of other specified B group vitamins: Secondary | ICD-10-CM | POA: Diagnosis not present

## 2016-05-20 DIAGNOSIS — D696 Thrombocytopenia, unspecified: Secondary | ICD-10-CM | POA: Diagnosis not present

## 2016-05-20 DIAGNOSIS — I10 Essential (primary) hypertension: Secondary | ICD-10-CM | POA: Diagnosis not present

## 2016-05-20 DIAGNOSIS — Z882 Allergy status to sulfonamides status: Secondary | ICD-10-CM | POA: Diagnosis not present

## 2016-05-20 DIAGNOSIS — C9000 Multiple myeloma not having achieved remission: Secondary | ICD-10-CM | POA: Diagnosis not present

## 2016-05-20 DIAGNOSIS — Z7984 Long term (current) use of oral hypoglycemic drugs: Secondary | ICD-10-CM | POA: Diagnosis not present

## 2016-05-20 DIAGNOSIS — Z87891 Personal history of nicotine dependence: Secondary | ICD-10-CM | POA: Diagnosis not present

## 2016-05-23 ENCOUNTER — Ambulatory Visit: Payer: Self-pay

## 2016-05-24 ENCOUNTER — Ambulatory Visit (INDEPENDENT_AMBULATORY_CARE_PROVIDER_SITE_OTHER): Payer: Medicare Other

## 2016-05-24 ENCOUNTER — Encounter: Payer: Self-pay | Admitting: Hematology and Oncology

## 2016-05-24 ENCOUNTER — Inpatient Hospital Stay: Payer: Medicare Other | Attending: Hematology and Oncology

## 2016-05-24 ENCOUNTER — Inpatient Hospital Stay: Payer: Medicare Other

## 2016-05-24 ENCOUNTER — Other Ambulatory Visit: Payer: Self-pay

## 2016-05-24 ENCOUNTER — Inpatient Hospital Stay (HOSPITAL_BASED_OUTPATIENT_CLINIC_OR_DEPARTMENT_OTHER): Payer: Medicare Other | Admitting: Hematology and Oncology

## 2016-05-24 VITALS — BP 138/66 | HR 76 | Temp 98.5°F | Ht 65.5 in | Wt 216.8 lb

## 2016-05-24 VITALS — BP 160/76 | HR 80 | Temp 98.6°F | Resp 18 | Wt 218.7 lb

## 2016-05-24 DIAGNOSIS — Z0000 Encounter for general adult medical examination without abnormal findings: Secondary | ICD-10-CM | POA: Diagnosis not present

## 2016-05-24 DIAGNOSIS — Z803 Family history of malignant neoplasm of breast: Secondary | ICD-10-CM

## 2016-05-24 DIAGNOSIS — E785 Hyperlipidemia, unspecified: Secondary | ICD-10-CM | POA: Diagnosis not present

## 2016-05-24 DIAGNOSIS — D469 Myelodysplastic syndrome, unspecified: Secondary | ICD-10-CM

## 2016-05-24 DIAGNOSIS — E11319 Type 2 diabetes mellitus with unspecified diabetic retinopathy without macular edema: Secondary | ICD-10-CM

## 2016-05-24 DIAGNOSIS — Z87891 Personal history of nicotine dependence: Secondary | ICD-10-CM | POA: Diagnosis not present

## 2016-05-24 DIAGNOSIS — C9000 Multiple myeloma not having achieved remission: Secondary | ICD-10-CM

## 2016-05-24 DIAGNOSIS — Z79899 Other long term (current) drug therapy: Secondary | ICD-10-CM | POA: Diagnosis not present

## 2016-05-24 DIAGNOSIS — D649 Anemia, unspecified: Secondary | ICD-10-CM | POA: Insufficient documentation

## 2016-05-24 DIAGNOSIS — Z85828 Personal history of other malignant neoplasm of skin: Secondary | ICD-10-CM | POA: Diagnosis not present

## 2016-05-24 DIAGNOSIS — N400 Enlarged prostate without lower urinary tract symptoms: Secondary | ICD-10-CM | POA: Diagnosis not present

## 2016-05-24 DIAGNOSIS — C9140 Hairy cell leukemia not having achieved remission: Secondary | ICD-10-CM | POA: Diagnosis not present

## 2016-05-24 DIAGNOSIS — Z794 Long term (current) use of insulin: Secondary | ICD-10-CM

## 2016-05-24 DIAGNOSIS — D72819 Decreased white blood cell count, unspecified: Secondary | ICD-10-CM | POA: Insufficient documentation

## 2016-05-24 DIAGNOSIS — D46Z Other myelodysplastic syndromes: Secondary | ICD-10-CM

## 2016-05-24 DIAGNOSIS — I10 Essential (primary) hypertension: Secondary | ICD-10-CM | POA: Diagnosis not present

## 2016-05-24 DIAGNOSIS — E538 Deficiency of other specified B group vitamins: Secondary | ICD-10-CM

## 2016-05-24 DIAGNOSIS — K219 Gastro-esophageal reflux disease without esophagitis: Secondary | ICD-10-CM

## 2016-05-24 DIAGNOSIS — Z7982 Long term (current) use of aspirin: Secondary | ICD-10-CM

## 2016-05-24 DIAGNOSIS — R0602 Shortness of breath: Secondary | ICD-10-CM | POA: Diagnosis not present

## 2016-05-24 DIAGNOSIS — E11311 Type 2 diabetes mellitus with unspecified diabetic retinopathy with macular edema: Secondary | ICD-10-CM

## 2016-05-24 DIAGNOSIS — C9001 Multiple myeloma in remission: Secondary | ICD-10-CM | POA: Insufficient documentation

## 2016-05-24 DIAGNOSIS — K5790 Diverticulosis of intestine, part unspecified, without perforation or abscess without bleeding: Secondary | ICD-10-CM | POA: Insufficient documentation

## 2016-05-24 DIAGNOSIS — Z86010 Personal history of colonic polyps: Secondary | ICD-10-CM | POA: Insufficient documentation

## 2016-05-24 DIAGNOSIS — C9141 Hairy cell leukemia, in remission: Secondary | ICD-10-CM

## 2016-05-24 DIAGNOSIS — Z9221 Personal history of antineoplastic chemotherapy: Secondary | ICD-10-CM

## 2016-05-24 LAB — HEMOGLOBIN A1C: HEMOGLOBIN A1C: 6.9 % — AB (ref 4.6–6.5)

## 2016-05-24 LAB — COMPREHENSIVE METABOLIC PANEL
ALT: 23 U/L (ref 17–63)
AST: 24 U/L (ref 15–41)
Albumin: 4 g/dL (ref 3.5–5.0)
Alkaline Phosphatase: 54 U/L (ref 38–126)
Anion gap: 4 — ABNORMAL LOW (ref 5–15)
BUN: 23 mg/dL — ABNORMAL HIGH (ref 6–20)
CO2: 25 mmol/L (ref 22–32)
Calcium: 9 mg/dL (ref 8.9–10.3)
Chloride: 108 mmol/L (ref 101–111)
Creatinine, Ser: 1.36 mg/dL — ABNORMAL HIGH (ref 0.61–1.24)
GFR calc Af Amer: 55 mL/min — ABNORMAL LOW (ref >60)
GFR calc non Af Amer: 48 mL/min — ABNORMAL LOW (ref >60)
Glucose, Bld: 255 mg/dL — ABNORMAL HIGH (ref 65–99)
Potassium: 4 mmol/L (ref 3.5–5.1)
Sodium: 137 mmol/L (ref 135–145)
Total Bilirubin: 0.8 mg/dL (ref 0.3–1.2)
Total Protein: 7 g/dL (ref 6.5–8.1)

## 2016-05-24 LAB — LIPID PANEL
Cholesterol: 150 mg/dL (ref 0–200)
HDL: 35.9 mg/dL — ABNORMAL LOW (ref >39.00)
NONHDL: 114.32
Total CHOL/HDL Ratio: 4
Triglycerides: 344 mg/dL — ABNORMAL HIGH (ref 0.0–149.0)
VLDL: 68.8 mg/dL — ABNORMAL HIGH (ref 0.0–40.0)

## 2016-05-24 LAB — LDL CHOLESTEROL, DIRECT: LDL DIRECT: 71 mg/dL

## 2016-05-24 LAB — CBC WITH DIFFERENTIAL/PLATELET
Basophils Absolute: 0 10*3/uL (ref 0–0.1)
Basophils Relative: 1 %
Eosinophils Absolute: 0.1 10*3/uL (ref 0–0.7)
Eosinophils Relative: 4 %
HCT: 25.6 % — ABNORMAL LOW (ref 40.0–52.0)
Hemoglobin: 9 g/dL — ABNORMAL LOW (ref 13.0–18.0)
Lymphocytes Relative: 26 %
Lymphs Abs: 0.6 10*3/uL — ABNORMAL LOW (ref 1.0–3.6)
MCH: 38.8 pg — ABNORMAL HIGH (ref 26.0–34.0)
MCHC: 35.1 g/dL (ref 32.0–36.0)
MCV: 110.5 fL — ABNORMAL HIGH (ref 80.0–100.0)
Monocytes Absolute: 0.2 10*3/uL (ref 0.2–1.0)
Monocytes Relative: 7 %
Neutro Abs: 1.4 10*3/uL (ref 1.4–6.5)
Neutrophils Relative %: 62 %
Platelets: 88 10*3/uL — ABNORMAL LOW (ref 150–440)
RBC: 2.31 MIL/uL — ABNORMAL LOW (ref 4.40–5.90)
RDW: 15.6 % — ABNORMAL HIGH (ref 11.5–14.5)
WBC: 2.3 10*3/uL — ABNORMAL LOW (ref 3.8–10.6)

## 2016-05-24 MED ORDER — LENALIDOMIDE 10 MG PO CAPS
ORAL_CAPSULE | ORAL | 2 refills | Status: DC
Start: 2016-05-24 — End: 2016-06-19

## 2016-05-24 MED ORDER — EPOETIN ALFA 10000 UNIT/ML IJ SOLN
10000.0000 [IU] | Freq: Once | INTRAMUSCULAR | Status: AC
  Administered 2016-05-24: 10000 [IU] via SUBCUTANEOUS
  Filled 2016-05-24: qty 2

## 2016-05-24 MED ORDER — LENALIDOMIDE 10 MG PO CAPS: ORAL_CAPSULE | ORAL | 2 refills | Status: DC

## 2016-05-24 NOTE — Progress Notes (Signed)
[facility] day:  05/24/2016  Chief Complaint: Alex Smith is a 81 y.o. male with a myelodysplastic marrow and plasma cell neoplasm who is seen for 1 month assessment on Procrit.  HPI:  The patient was last seen in the medical oncology clinic on 04/26/2016.  At that time, we discussed a trial of Procrit.  He received 10,000 units of Procrit weekly (04/26/2016 - 05/17/2016).  Hemoglobin has ranged between 8.4 and 9.1 without trend.  Hemoglobin was 9.1 on 04/26/2016 and 8.9 on 05/17/2016.  Additional labs on 04/26/2016 included a creatinine of 1.28 (CrCl 52 ml/min), ferritin 50, iron saturation of 26%, TIBC of 391.  B12 was 765 and folate 38.0.   The patient was seen by Dr. Zigmund Gottron for follow-up at [facility] on 05/21/2016.  Recommendations were to consider a trial of Revlimid and Decadron with close monitoring of his blood sugar.  Symptomatically, he denies any new complaints.  He denies any fevers, sweats or weight loss.  He denies any bone pain.  Energy level is "so-so".   Past Medical History:  Diagnosis Date  . Anemia   . B12 deficiency   . Basal cell carcinoma of face   . BPH (benign prostatic hypertrophy)    followed by urology, discharged (Dr. Bernardo Heater)  . CAP (community acquired pneumonia) 02/15/2015  . Colon polyps   . Diverticulosis   . GERD (gastroesophageal reflux disease)   . 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 History of pneumonia 2000's   "once" (07/07/2012)  . History of shingles   . HLD (hyperlipidemia)   . Hypertension   . Pneumonia   . Shortness of breath dyspnea   . Systolic murmur 25/42/7062  . Type 2 diabetes, controlled, with retinopathy ([place])     Past Surgical History:  Procedure Laterality Date  . [place] VITRECTOMY WITH 20 GAUGE MVR PORT FOR MACULAR HOLE  07/07/2012   Procedure: 25 GAUGE PARS PLANA VITRECTOMY WITH 20 GAUGE MVR PORT FOR  MACULAR HOLE;  Surgeon: Hayden Pedro, MD;  Location: [facility];  Service: Ophthalmology;  Laterality: Left;  . BONE MARROW BIOPSY  2016  . CARDIOVASCULAR STRESS TEST  2013   treadmill - no evidence ischemia, EF 61%  . CATARACT EXTRACTION W/ INTRAOCULAR LENS  IMPLANT, BILATERAL  ~ 2010  . COLONOSCOPY  2014   [place] WNL no rpt needed, h/o polyps  . EYE SURGERY Left 06/2012   laser surgery  . GAS INSERTION  07/07/2012   Procedure: INSERTION OF GAS;  Surgeon: Hayden Pedro, MD;  Location: [facility];  Service: Ophthalmology;  Laterality: Left;  [id number]  . PERIPHERAL VASCULAR CATHETERIZATION N/A 02/23/2015   Procedure: Glori Luis Cath Insertion;  Surgeon: Algernon Huxley, MD;  Location: [facility];  Service: Cardiovascular;  Laterality: N/A;  . SERUM PATCH  07/07/2012   Procedure: SERUM PATCH;  Surgeon: Hayden Pedro, MD;  Location: [facility];  Service: Ophthalmology;  Laterality: Left;  . SKIN CANCER EXCISION     "all over my face" (07/07/2012)    Family History  Problem Relation Age of Onset  . Dementia Mother   . Heart failure Father 53  . Cancer Sister     breast  . Diabetes Paternal Uncle   . Diabetes Paternal Aunt   . CAD Brother 52    MI  . Stroke Neg Hx  Social History:  reports that he quit smoking about 47 years ago. His smoking use included Cigarettes. He has a 50.00 pack-year smoking history. He has never used smokeless tobacco. He reports that he drinks alcohol. He reports that he does not use drugs.  He works at [employer] 20-32 hours/week.  The patient 's wife has had 3 hip operations.  She underwent hip replacement 03/06/2016.  He takes care of his wife.  He states that they either eat out or bring food in.  His best contact number is his cell: [phone].  He is alone today.  Allergies:  Allergies  Allergen Reactions  . Rituximab Rash    Chest tightness  . Blood-Group Specific Substance Other (See Comments)    Had a post transfusion reaction of red blood cells; NOW  REQUIRES WASHED BLOOD CELLS  . Primaxin [Imipenem] Other (See Comments)    Possible allergy  . Sulfa Antibiotics Itching and Rash  Possible allergy to Zoloft, allopurinol, and a chemotherapy medication.  He tolerates Dapsone.  Current Medications: Current Outpatient Prescriptions  Medication Sig Dispense Refill  . acyclovir (ZOVIRAX) 400 MG tablet TAKE 2 TABLETS BY MOUTH TWICE DAILY 120 tablet 3  . aspirin EC 81 MG tablet Take 81 mg by mouth daily.    Marland Kitchen glucose blood (ACCU-CHEK AVIVA PLUS) test strip Use to check sugar twice daily Dx:E11.9 200 each 3  . HUMULIN R 100 UNIT/ML injection INJCET 10 UNITS SUBCUTANEOUSLY TWICE DAILY BEFORE MEALS 10 mL 11  . hydrochlorothiazide (HYDRODIURIL) 25 MG tablet TAKE ONE TABLET EVERY DAY 30 tablet 11  . insulin NPH Human (HUMULIN N) 100 UNIT/ML injection Inject 0.35 mLs (35 Units total) into the skin 2 (two) times daily before a meal. 20 mL 11  . lidocaine-prilocaine (EMLA) cream Apply 1 application topically as needed. 30 g 3  . lisinopril (PRINIVIL,ZESTRIL) 20 MG tablet TAKE ONE TABLET TWICE DAILY 60 tablet 6  . metFORMIN (GLUCOPHAGE) 1000 MG tablet TAKE ONE TABLET BY MOUTH TWICE DAILY AS DIRECTED 180 tablet 3  . MICROLET LANCETS MISC USE AS DIRECTED 200 each 3  . pravastatin (PRAVACHOL) 20 MG tablet Take 1 tablet (20 mg total) by mouth at bedtime. 90 tablet 2  . ranitidine (ZANTAC) 150 MG tablet Take 1 tablet (150 mg total) by mouth 2 (two) times daily as needed. 180 tablet 3  . tamsulosin (FLOMAX) 0.4 MG CAPS capsule Take 0.4 mg by mouth daily.    . temazepam (RESTORIL) 7.5 MG capsule Take 7.5 mg by mouth once.     . traZODone (DESYREL) 50 MG tablet Take 0.5-1 tablets (25-50 mg total) by mouth at bedtime as needed for sleep. 30 tablet 3  . vitamin B-12 (CYANOCOBALAMIN) 1000 MCG tablet Take 1,000 mcg by mouth daily.    . vitamin E 400 UNIT capsule Take 200 Units by mouth daily.     Marland Kitchen lenalidomide (REVLIMID) 10 MG capsule 10 mg a day for 21 days then 7  days off 21 capsule 2   No current facility-administered medications for this visit.    Facility-Administered Medications Ordered in Other Visits  Medication Dose Route Frequency Provider Last Rate Last Dose  . heparin lock flush 100 unit/mL  500 Units Intravenous Once Lequita Asal, MD      . sodium chloride 0.9 % injection 10 mL  10 mL Intravenous PRN Lequita Asal, MD   10 mL at 03/03/15 0903  . sodium chloride 0.9 % injection 10 mL  10 mL Intracatheter PRN  Lequita Asal, MD   10 mL at 03/10/15 1410   Review of Systems:  GENERAL:  Fatigue.  Energy level is "so-so".  No fevers or sweats.  Weight stable. PERFORMANCE STATUS (ECOG):  1 HEENT: No visual changes, runny nose, sore throat. Pulmonary:  Shortness of breath on exertion.  Cough.  No hemoptysis. Cardiac:  No chest pain, palpitations, orthopnea, or PND. GI:  Appetite 75%.  Drinks Boost.  No nausea, vomiting, diarrhea, constipation, melena or hematochezia. GU:  No urgency, frequency, dysuria, or hematuria. Musculoskeletal:  No back pain.  No joint pain.  No muscle tenderness. Extremities:  No pain or swelling. Skin:  No rashes or skin changes. Neuro:  No headache, numbness or weakness, balance or coordination issues. Endocrine:  Diabetes.  No thyroid issues, hot flashes or night sweats. Psych:  No mood changes, depression or anxiety.  Poor sleep. Pain:  No focal pain. Review of systems:  All other systems reviewed and found to be negative.   Physical Exam: Blood pressure (!) 160/76, pulse 80, temperature 98.6 F (37 C), temperature source Tympanic, resp. rate 18, weight 218 lb 11.1 oz (99.2 kg).  GENERAL:  Well developed, well nourished, gentleman sitting comfortably in the exam room in no acute distress. MENTAL STATUS:  Alert and oriented to person, place and time. HEAD:  Pearline Cables whitet hair.  Normocephalic, atraumatic, face symmetric, no Cushingoid features. EYES:  Glasses. Blue eyes.  Pupils equal round and  reactive to light and accomodation.  No conjunctivitis or scleral icterus. ENT:  Oropharynx clear without lesion.  Tongue normal. Mucous membranes moist.  RESPIRATORY:  Clear to auscultation without rales, wheezes or rhonchi. CARDIOVASCULAR:  Regular rate and rhythm without murmur, rub or gallop. ABDOMEN:  Soft, non-tender, with active bowel sounds, and no hepatosplenomegaly.  No masses. SKIN:  Eschars on knees s/p fall.  No rashes or ulcers. EXTREMITIES: No edema, no skin discoloration or tenderness.  No palpable cords. LYMPH NODES: No palpable cervical, supraclavicular, axillary or inguinal adenopathy  NEUROLOGICAL: Unremarkable. PSYCH:  Appropriate.    Clinical Support on 05/24/2016  Component Date Value Ref Range Status  . Cholesterol 05/24/2016 150  0 - 200 mg/dL Final  . Triglycerides 05/24/2016 344.0* 0.0 - 149.0 mg/dL Final  . HDL 05/24/2016 35.90* >39.00 mg/dL Final  . VLDL 05/24/2016 68.8* 0.0 - 40.0 mg/dL Final  . Total CHOL/HDL Ratio 05/24/2016 4   Final  . NonHDL 05/24/2016 114.32   Final  . Hgb A1c MFr Bld 05/24/2016 6.9* 4.6 - 6.5 % Final  . Fructosamine 05/27/2016 286* 190 - 270 umol/L Final  . Direct LDL 05/24/2016 71.0  mg/dL Final  Orders Only on 05/24/2016  Component Date Value Ref Range Status  . WBC 05/24/2016 2.3* 3.8 - 10.6 K/uL Final  . RBC 05/24/2016 2.31* 4.40 - 5.90 MIL/uL Final  . Hemoglobin 05/24/2016 9.0* 13.0 - 18.0 g/dL Final  . HCT 05/24/2016 25.6* 40.0 - 52.0 % Final  . MCV 05/24/2016 110.5* 80.0 - 100.0 fL Final  . MCH 05/24/2016 38.8* 26.0 - 34.0 pg Final  . MCHC 05/24/2016 35.1  32.0 - 36.0 g/dL Final  . RDW 05/24/2016 15.6* 11.5 - 14.5 % Final  . Platelets 05/24/2016 88* 150 - 440 K/uL Final  . Neutrophils Relative % 05/24/2016 62  % Final  . Neutro Abs 05/24/2016 1.4  1.4 - 6.5 K/uL Final  . Lymphocytes Relative 05/24/2016 26  % Final  . Lymphs Abs 05/24/2016 0.6* 1.0 - 3.6 K/uL Final  . Monocytes  Relative 05/24/2016 7  % Final  .  Monocytes Absolute 05/24/2016 0.2  0.2 - 1.0 K/uL Final  . Eosinophils Relative 05/24/2016 4  % Final  . Eosinophils Absolute 05/24/2016 0.1  0 - 0.7 K/uL Final  . Basophils Relative 05/24/2016 1  % Final  . Basophils Absolute 05/24/2016 0.0  0 - 0.1 K/uL Final  . Sodium 05/24/2016 137  135 - 145 mmol/L Final  . Potassium 05/24/2016 4.0  3.5 - 5.1 mmol/L Final  . Chloride 05/24/2016 108  101 - 111 mmol/L Final  . CO2 05/24/2016 25  22 - 32 mmol/L Final  . Glucose, Bld 05/24/2016 255* 65 - 99 mg/dL Final  . BUN 05/24/2016 23* 6 - 20 mg/dL Final  . Creatinine, Ser 05/24/2016 1.36* 0.61 - 1.24 mg/dL Final  . Calcium 05/24/2016 9.0  8.9 - 10.3 mg/dL Final  . Total Protein 05/24/2016 7.0  6.5 - 8.1 g/dL Final  . Albumin 05/24/2016 4.0  3.5 - 5.0 g/dL Final  . AST 05/24/2016 24  15 - 41 U/L Final  . ALT 05/24/2016 23  17 - 63 U/L Final  . Alkaline Phosphatase 05/24/2016 54  38 - 126 U/L Final  . Total Bilirubin 05/24/2016 0.8  0.3 - 1.2 mg/dL Final  . GFR calc non Af Amer 05/24/2016 48* >60 mL/min Final  . GFR calc Af Amer 05/24/2016 55* >60 mL/min Final   Comment: (NOTE) The eGFR has been calculated using the CKD EPI equation. This calculation has not been validated in all clinical situations. eGFR's persistently <60 mL/min signify possible Chronic Kidney Disease.   Georgiann Hahn gap 05/24/2016 4* 5 - 15 Final    Assessment:  Alex Smith is a 81 y.o. 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  He was diagnosed with hairy cell leukemia in 2006.  He received cladribine 11/06-11/05/2005.  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   Bone marrow aspirate and biopsy on 02/07/2015 revealed a extensive marrow involvement by recurrent hairy cell leukemia (approximately 80% of cells in the core). There was a monoclonal plasma cell infiltrate (approximately 10%), compatible with a plasma cell neoplasm. Marrow was hypercellular (40-50%) with markedly diminished  residual trilineage hematopoiesis and mild multilineage dyspoiesis.  Peripheral smear revealed leukopenia (WBC 1900) with 2% atypical lymphoid cells compatible with hairy cell leukemia. There was moderate anemia with anisopoikilocytosis. 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  FISH studies for MDS were negative.  Cytogenetics were normal (46, XY).  He has an unclear allergy history.  He may be allergic to Primaxin, Zoloft, allopurinol, and a chemotherapy medication.  Reaction occurred in 06/2005.  He is allergic to sulfa.  He is not allergic to dapsone.  He requires washed RBCs.  He is on prophylactic acyclovir.  Dapsone and voriconazole were discontinued.  He has a history of shingles prior to prophylactic acyclovir.  He is 13 months status post cladribine (began 03/03/2015).  His counts plateaued on 07/25/2015 and have subsequently decreased.  Labs on 07/25/2015 revealed a normal B12 (894), folate (60.5), and TSH (2.562).  Ferritin was 52.  Bone marrow on 11/03/2015 revealed persistent plasma cell neoplasm, now with mildy atypical monoclonal plasma cells estimated at 20-30%.   There was no morphologic evidence of residual hairy cell leukemia.  There was a minute population (0.03%) with hairy cell immunophenotype detected by flow.  Marrow was normocellular for age with relative erythroid hyperplasia, relative myeloid  hypoplasia, mild dyspoiesis, adequate megakaryocytes and no increased blasts.  There was no significant increase in marrow reticulin fibers.  Iron was present.  FISH studies for MDS were negative.  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  Cytogenetics were normal (46, XY).  Bone marrow on 03/13/2016 was normocellular to marginally hypercellular for age (20-40%) with relative erythroid hyperplasia and mild dyserythropoiesis, decreased granulopoiesis and adequate  megakaryocytes.  There was a persistent 15-20% plasma cell neoplasm.  There was no overt increase in reticulin fibrosis.  Storage iron was present.  Flow cytometry revealed abnormal/monocytic plasma cell population (3% sample), consistent with a plasma cell neoplastic process.  Cytogenetics were normal (46, XY).  FISH studies for myeloma revealed a CCND1/IGH translocation, t(11;14) in 53% of nuclei.  SPEP on 11/17/2015 revealed a 1.3 gm/dL monoclonal spike (1.4 gm/dL on 07/25/2015 and 1.0 on 02/03/2015).  Kappa free light chains were 50.51, lambda free light chains were 10.48 with a ratio of 4.82 (ratio 3.68 on 07/25/2015).  Beta2-microglobulin was 2.5.  24 hour urine on 11/21/2015 revealed 234.8 mg protein in 24 hours (no monoclonal protein).  Bone survey on 11/24/2015 revealed no lytic lesions.  Epo level was 329.6 on 03/29/2016.  He began a trial of Procrit.  He received 10,000 units of Procrit weekly (04/26/2016 - 05/17/2016).  Hemoglobin ranged between 8.4 and 9.1 without trend.    PET scan on 03/14/2016 revealed no evidence of hypermetabolic lymphadenopathy or focal skeletal lesions to suggest marrow or cortical bone involvement.  Symptomatically, he has chronic fatigue.  Exam is stable.  Hemoglobin is stable.  Plan: 1.  Labs today:  CBC with diff.  2.  Discuss smoldering multiple myeloma with low risk MDS.  He has symptomatic anemia and constitutional symptoms.  At this point, he is not transfusion dependent.  His hemoglobin is stable, but has not improved.  I discussed his conversation with Dr. [name] at [facility]. We discussed  a trial of Revlimid plus Decadron as Revlimid has activity in both myeloma and non 5q- MDS ([employer] of H[employer] 2012 156(5): [phone]). Side effects of Revlimid plus Decadron were re-reviewed including myelosuppression, rash, thrombosis, diarrhea, nausea, birth defects, hyperglycemia, elevated blood pressure, fluid retention, and insomnia.   I discussed the  need for control of his diabetes as he will also be on Decadron 20 mg a week. He was agreeable with this plan.  We discussed continuation Procrit until he is approved for Revlimid and Decadron.  At that time, his Procrit will be discontinued although one could argue that an ESA plus Revlimid is better than Revlimid alone with underlying myeloma.   3.  Procrit 10,000 units SQ today. 4.  RTC weekly for Hgb +/- Procrit until institution of Revlimid and Decadon. 5.  Rx: Revlimid 10 mg po q day 1-21 followed by 7 days off (28 day cycle). 6.  Anticipate adding Decadron 20 mg po q week (day 1,8,15, and 22 of a 28 day cycle). 7.  Prophylaxis for thrombosis: aspirin 81 mg a day. 8.  Anticipate weekly CBC with diff with cycle #1 Revlimid and Decadron (consider GCSF support to maintain ANC > 1000). 9.  Phone follow-up with Dr. Ria Bush re: Decadron induced hyperglycemia- done. 10.  Patient to call after Revlimid in for MD appointment, chemotherapy teaching, labs (CBC with diff, CMP, SPEP, LDH, uric acid, Mg) and initiation of Revlimid.   Lequita Asal, MD  05/24/2016

## 2016-05-24 NOTE — Patient Instructions (Signed)
Alex Smith , Thank you for taking time to come for your Medicare Wellness Visit. I appreciate your ongoing commitment to your health goals. Please review the following plan we discussed and let me know if I can assist you in the future.   These are the goals we discussed: Goals    . Increase water intake          Starting 05/24/216, I will attempt to drink at least 8 oz of water daily with each meal and with medications.        This is a list of the screening recommended for you and due dates:  Health Maintenance  Topic Date Due  . DTaP/Tdap/Td vaccine (1 - Tdap) 08/18/2016*  . Tetanus Vaccine  08/18/2016*  . Shingles Vaccine  05/24/2026*  . Complete foot exam   11/19/2016  . Hemoglobin A1C  11/22/2016  . Eye exam for diabetics  01/21/2017  . Flu Shot  Addressed  . Pneumonia vaccines  Completed  *Topic was postponed. The date shown is not the original due date.   Preventive Care for Adults  A healthy lifestyle and preventive care can promote health and wellness. Preventive health guidelines for adults include the following key practices.  . A routine yearly physical is a good way to check with your health care provider about your health and preventive screening. It is a chance to share any concerns and updates on your health and to receive a thorough exam.  . Visit your dentist for a routine exam and preventive care every 6 months. Brush your teeth twice a day and floss once a day. Good oral hygiene prevents tooth decay and gum disease.  . The frequency of eye exams is based on your age, health, family medical history, use  of contact lenses, and other factors. Follow your health care provider's ecommendations for frequency of eye exams.  . Eat a healthy diet. Foods like vegetables, fruits, whole grains, low-fat dairy products, and lean protein foods contain the nutrients you need without too many calories. Decrease your intake of foods high in solid fats, added sugars, and salt.  Eat the right amount of calories for you. Get information about a proper diet from your health care provider, if necessary.  . Regular physical exercise is one of the most important things you can do for your health. Most adults should get at least 150 minutes of moderate-intensity exercise (any activity that increases your heart rate and causes you to sweat) each week. In addition, most adults need muscle-strengthening exercises on 2 or more days a week.  Silver Sneakers may be a benefit available to you. To determine eligibility, you may visit the website: www.silversneakers.com or contact program at 431-362-4644 Mon-Fri between 8AM-8PM.   . Maintain a healthy weight. The body mass index (BMI) is a screening tool to identify possible weight problems. It provides an estimate of body fat based on height and weight. Your health care provider can find your BMI and can help you achieve or maintain a healthy weight.   For adults 20 years and older: ? A BMI below 18.5 is considered underweight. ? A BMI of 18.5 to 24.9 is normal. ? A BMI of 25 to 29.9 is considered overweight. ? A BMI of 30 and above is considered obese.   . Maintain normal blood lipids and cholesterol levels by exercising and minimizing your intake of saturated fat. Eat a balanced diet with plenty of fruit and vegetables. Blood tests for lipids and cholesterol  should begin at age 87 and be repeated every 5 years. If your lipid or cholesterol levels are high, you are over 50, or you are at high risk for heart disease, you may need your cholesterol levels checked more frequently. Ongoing high lipid and cholesterol levels should be treated with medicines if diet and exercise are not working.  . If you smoke, find out from your health care provider how to quit. If you do not use tobacco, please do not start.  . If you choose to drink alcohol, please do not consume more than 2 drinks per day. One drink is considered to be 12 ounces (355  mL) of beer, 5 ounces (148 mL) of wine, or 1.5 ounces (44 mL) of liquor.  . If you are 37-12 years old, ask your health care provider if you should take aspirin to prevent strokes.  . Use sunscreen. Apply sunscreen liberally and repeatedly throughout the day. You should seek shade when your shadow is shorter than you. Protect yourself by wearing long sleeves, pants, a wide-brimmed hat, and sunglasses year round, whenever you are outdoors.  . Once a month, do a whole body skin exam, using a mirror to look at the skin on your back. Tell your health care provider of new moles, moles that have irregular borders, moles that are larger than a pencil eraser, or moles that have changed in shape or color.

## 2016-05-24 NOTE — Progress Notes (Signed)
Patient has questions for MD today regarding medications Cancer MD in [facility] wants to start patient on.

## 2016-05-26 ENCOUNTER — Other Ambulatory Visit: Payer: Self-pay | Admitting: *Deleted

## 2016-05-26 DIAGNOSIS — C9141 Hairy cell leukemia, in remission: Secondary | ICD-10-CM

## 2016-05-26 DIAGNOSIS — C9000 Multiple myeloma not having achieved remission: Secondary | ICD-10-CM

## 2016-05-26 NOTE — Progress Notes (Signed)
Pre visit review using our clinic review tool, if applicable. No additional management support is needed unless otherwise documented below in the visit note. 

## 2016-05-26 NOTE — Progress Notes (Signed)
Subjective:   Alex Smith is a 81 y.o. male who presents for Medicare Annual/Subsequent preventive examination.  Review of Systems:  N/A Cardiac Risk Factors include: advanced age (>69mn, >>41women);obesity (BMI >30kg/m2);male gender;diabetes mellitus;dyslipidemia;hypertension     Objective:    Vitals: BP 138/66 (BP Location: Left Arm, Patient Position: Sitting, Cuff Size: Normal)   Pulse 76   Temp 98.5 F (36.9 C) (Oral)   Ht 5' 5.5" (1.664 m) Comment: no shoes  Wt 216 lb 12 oz (98.3 kg)   SpO2 96%   BMI 35.52 kg/m   Body mass index is 35.52 kg/m.  Tobacco History  Smoking Status  . Former Smoker  . Packs/day: 2.00  . Years: 25.00  . Types: Cigarettes  . Quit date: 02/06/1969  Smokeless Tobacco  . Never Used    Comment: 07/07/2012 "stopped smoking ~ 40 yr ago; smoked 20-231yr    Counseling given: No   Past Medical History:  Diagnosis Date  . Anemia   . B12 deficiency   . Basal cell carcinoma of face   . BPH (benign prostatic hypertrophy)    followed by urology, discharged (Dr. StBernardo Heater . CAP (community acquired pneumonia) 02/15/2015  . Colon polyps   . Diverticulosis   . GERD (gastroesophageal reflux disease)   . [place]   recurrent, seizure on rituxan, now on cladribine ([place])  . History of pneumonia 2000's   "once" (07/07/2012)  . History of shingles   . HLD (hyperlipidemia)   . Hypertension   . Pneumonia   . Shortness of breath dyspnea   . Type 2 diabetes, controlled, with retinopathy ([place]   Past Surgical History:  Procedure Laterality Date  . 25[place]RECTOMY WITH 20 GAUGE MVR PORT FOR MACULAR HOLE  07/07/2012   Procedure: 25 GAUGE PARS PLANA VITRECTOMY WITH 20 GAUGE MVR PORT FOR MACULAR HOLE;  Surgeon: JoHayden PedroMD;  [facility]: [facility];  Laterality: Left;  . BONE MARROW BIOPSY  2016  . CARDIOVASCULAR STRESS TEST  2013   treadmill - no evidence ischemia, EF 61%  .  CATARACT EXTRACTION W/ INTRAOCULAR LENS  IMPLANT, BILATERAL  ~ 2010  . COLONOSCOPY  2014   [place] WNL no rpt needed, h/o polyps  . EYE SURGERY Left 06/2012   laser surgery  . GAS INSERTION  07/07/2012   Procedure: INSERTION OF GAS;  Surgeon: JoHayden PedroMD;  [facility]: Ophthal[facility];  Laterality: Left;  C3F8  . PERIPHERAL VASCULAR CATHETERIZATION N/A 02/23/2015   Procedure: PoGlori Luisath Insertion;  Surgeon: JaAlgernon HuxleyMD;  [facility];  Service: Cardiovascular;  Laterality: N/A;  . SERUM PATCH  07/07/2012   Procedure: SERUM PATCH;  Surgeon: JoHayden PedroMD;  [facility] Service: [facility]hthal[facility];  Laterality: Left;  . SKIN CANCER EXCISION     "all over my face" (07/07/2012)   Family History  Problem Relation Age of Onset  . Dementia Mother   . Heart failure Father [id number]. Cancer Sister     breast  . Diabetes Paternal Uncle   . Diabetes Paternal Aunt   . CAD Brother 4719  MI  . Stroke Neg Hx    History  Sexual Activity  . Sexual activity: No    Outpatient Encounter Prescriptions as of 05/24/2016  Medication Sig  . acyclovir (ZOVIRAX) 400 MG tablet TAKE 2 TABLETS BY MOUTH TWICE  DAILY  . aspirin EC 81 MG tablet Take 81 mg by mouth daily.  Marland Kitchen glucose blood (ACCU-CHEK AVIVA PLUS) test strip Use to check sugar twice daily Dx:E11.9  . HUMULIN R 100 UNIT/ML injection INJCET 10 UNITS SUBCUTANEOUSLY TWICE DAILY BEFORE MEALS  . hydrochlorothiazide (HYDRODIURIL) 25 MG tablet TAKE ONE TABLET EVERY DAY  . insulin NPH Human (HUMULIN N) 100 UNIT/ML injection Inject 0.35 mLs (35 Units total) into the skin 2 (two) times daily before a meal.  . lenalidomide (REVLIMID) 10 MG capsule 10 mg a day for 21 days then 7 days off  . lidocaine-prilocaine (EMLA) cream Apply 1 application topically as needed.  Marland Kitchen lisinopril (PRINIVIL,ZESTRIL) 20 MG tablet TAKE ONE TABLET TWICE DAILY  . metFORMIN (GLUCOPHAGE) 1000 MG tablet TAKE ONE TABLET BY MOUTH TWICE DAILY AS  DIRECTED  . MICROLET LANCETS MISC USE AS DIRECTED  . pravastatin (PRAVACHOL) 20 MG tablet Take 1 tablet (20 mg total) by mouth at bedtime.  . ranitidine (ZANTAC) 150 MG tablet Take 1 tablet (150 mg total) by mouth 2 (two) times daily as needed.  . tamsulosin (FLOMAX) 0.4 MG CAPS capsule Take 0.4 mg by mouth daily.  . temazepam (RESTORIL) 7.5 MG capsule Take 7.5 mg by mouth once.   . traZODone (DESYREL) 50 MG tablet Take 0.5-1 tablets (25-50 mg total) by mouth at bedtime as needed for sleep.  . vitamin B-12 (CYANOCOBALAMIN) 1000 MCG tablet Take 1,000 mcg by mouth daily.  . vitamin E 400 UNIT capsule Take 200 Units by mouth daily.    Facility-Administered Encounter Medications as of 05/24/2016  Medication  . heparin lock flush 100 unit/mL  . sodium chloride 0.9 % injection 10 mL  . sodium chloride 0.9 % injection 10 mL    Activities of Daily Living In your present state of health, do you have any difficulty performing the following activities: 05/24/2016  Hearing? N  Vision? Y  Difficulty concentrating or making decisions? N  Walking or climbing stairs? Y  Dressing or bathing? N  Doing errands, shopping? N  Preparing Food and eating ? N  Using the Toilet? N  In the past six months, have you accidently leaked urine? Y  Do you have problems with loss of bowel control? N  Managing your Medications? N  Managing your Finances? N  Housekeeping or managing your Housekeeping? N  Some recent data might be hidden    Patient Care Team: Ria Bush, MD as PCP - General (Family Medicine) Lequita Asal, MD as Referring Physician (Hematology and Oncology)   Assessment:     Hearing Screening   _0  _1  _2  _3  _4  _5  _6  _7  _8   Right ear:   40 40 40  40    Left ear:   40 40 40  40    Vision Screening Comments: Last vision exam in May 2017   Exercise Activities and Dietary recommendations Current Exercise Habits: The patient does not participate in  regular exercise at present, Exercise limited by: None identified  Goals    . Increase water intake          Starting 05/24/216, I will attempt to drink at least 8 oz of water daily with each meal and with medications.       Fall Risk Fall Risk  05/24/2016 05/22/2015 04/12/2014  Falls in the past year? Yes No Yes  Number falls in past yr: 1 - 2 or more  Injury with Fall? Yes - -  Risk Factor Category  - -  High Fall Risk  Risk for fall due to : - - Other (Comment)  Risk for fall due to (comments): - - Tripped and fell  Follow up Falls evaluation completed;Falls prevention discussed - -   Depression Screen PHQ 2/9 Scores 05/24/2016 05/22/2015 02/03/2015 04/12/2014  PHQ - 2 Score 0 0 0 1    Cognitive Testing MMSE - Mini Mental State Exam 05/24/2016  Orientation to time 5  Orientation to Place 5  Registration 3  Attention/ Calculation 0  Recall 1  Recall-comments pt was unable to recall 2 of 3 words  Language- name 2 objects 0  Language- repeat 1  Language- follow 3 step command 3  Language- read & follow direction 0  Write a sentence 0  Copy design 0  Total score 18   PLEASE NOTE: A Mini-Cog screen was completed. Maximum score is 20. A value of 0 denotes this part of Folstein MMSE was not completed or the patient failed this part of the Mini-Cog screening.   Mini-Cog Screening Orientation to Time - Max 5 pts Orientation to Place - Max 5 pts Registration - Max 3 pts Recall - Max 3 pts Language Repeat - Max 1 pts Language Follow 3 Step Command - Max 3 pts  Immunization History  Administered Date(s) Administered  . Influenza,inj,Quad PF,36+ Mos 05/22/2015  . Influenza,trivalent, recombinat, inj, PF 04/26/2014  . Influenza-Unspecified 04/19/2012  . Pneumococcal Conjugate-13 04/12/2014  . Pneumococcal Polysaccharide-23 08/19/2004  . Td 08/20/1995   Screening Tests Health Maintenance  Topic Date Due  . DTaP/Tdap/Td (1 - Tdap) 08/18/2016 (Originally 08/21/1995)  .  TETANUS/TDAP  08/18/2016 (Originally 08/19/2005)  . ZOSTAVAX  05/24/2026 (Originally 10/30/1996)  . FOOT EXAM  11/19/2016  . HEMOGLOBIN A1C  11/22/2016  . OPHTHALMOLOGY EXAM  01/21/2017  . INFLUENZA VACCINE  Addressed  . PNA vac Low Risk Adult  Completed      Plan:     I have personally reviewed and addressed the Medicare Annual Wellness questionnaire and have noted the following in the patient's chart:  A. Medical and social history B. Use of alcohol, tobacco or illicit drugs  C. Current medications and supplements D. Functional ability and status E.  Nutritional status F.  Physical activity G. Advance directives H. List of other physicians I.  Hospitalizations, surgeries, and ER visits in previous 12 months J.  [place] to include hearing, vision, cognitive, depression L. Referrals and appointments - none  In addition, I have reviewed and discussed with patient certain preventive protocols, quality metrics, and best practice recommendations. A written personalized care plan for preventive services as well as general preventive health recommendations were provided to patient.  See attached scanned questionnaire for additional information.   Signed,   Lindell Noe, MHA, BS, LPN Health Advisor

## 2016-05-26 NOTE — Progress Notes (Signed)
PCP notes:   Health maintenance:  A1C - completed Tetanus - postponed  Abnormal screenings:   Fall risk - hx of fall with injury  Patient concerns:   Patient reported a new concern with urinary incontinence. Also, patient is feeling overwhelmed with caring for spouse.  Nurse concerns:  None  Next PCP appt:   06/11/16 @ 1530

## 2016-05-27 ENCOUNTER — Telehealth: Payer: Self-pay | Admitting: Family Medicine

## 2016-05-27 LAB — FRUCTOSAMINE: FRUCTOSAMINE: 286 umol/L — AB (ref 190–270)

## 2016-05-27 NOTE — Progress Notes (Signed)
I reviewed health advisor's note, was available for consultation, and agree with documentation and plan.  

## 2016-05-27 NOTE — Telephone Encounter (Signed)
plz notify I touched base with Dr [name] regarding new chemo and decadron. Current A1c is 6.9%.  When he starts decadron, I'd like him to increase humulin N to 37 units twice daily with meals, continue regular insulin 10u with meals, and start keeping log of sugars TID fasting in am, before dinner, and 2 hrs after dinner and bring to next appointment.

## 2016-05-28 ENCOUNTER — Encounter: Payer: Self-pay | Admitting: Family Medicine

## 2016-05-28 DIAGNOSIS — R011 Cardiac murmur, unspecified: Secondary | ICD-10-CM

## 2016-05-28 HISTORY — DX: Cardiac murmur, unspecified: R01.1

## 2016-05-28 NOTE — Telephone Encounter (Signed)
Message left for patient to return my call.  

## 2016-05-28 NOTE — Telephone Encounter (Signed)
Patient notified and verbalized understanding. 

## 2016-05-31 ENCOUNTER — Inpatient Hospital Stay: Payer: Medicare Other

## 2016-05-31 VITALS — BP 145/71 | HR 61

## 2016-05-31 DIAGNOSIS — N400 Enlarged prostate without lower urinary tract symptoms: Secondary | ICD-10-CM | POA: Diagnosis not present

## 2016-05-31 DIAGNOSIS — K5790 Diverticulosis of intestine, part unspecified, without perforation or abscess without bleeding: Secondary | ICD-10-CM | POA: Diagnosis not present

## 2016-05-31 DIAGNOSIS — D469 Myelodysplastic syndrome, unspecified: Secondary | ICD-10-CM | POA: Diagnosis not present

## 2016-05-31 DIAGNOSIS — Z85828 Personal history of other malignant neoplasm of skin: Secondary | ICD-10-CM | POA: Diagnosis not present

## 2016-05-31 DIAGNOSIS — D649 Anemia, unspecified: Secondary | ICD-10-CM | POA: Diagnosis not present

## 2016-05-31 DIAGNOSIS — E785 Hyperlipidemia, unspecified: Secondary | ICD-10-CM | POA: Diagnosis not present

## 2016-05-31 DIAGNOSIS — D72819 Decreased white blood cell count, unspecified: Secondary | ICD-10-CM | POA: Diagnosis not present

## 2016-05-31 DIAGNOSIS — C9141 Hairy cell leukemia, in remission: Secondary | ICD-10-CM

## 2016-05-31 DIAGNOSIS — Z7982 Long term (current) use of aspirin: Secondary | ICD-10-CM | POA: Diagnosis not present

## 2016-05-31 DIAGNOSIS — C9000 Multiple myeloma not having achieved remission: Secondary | ICD-10-CM

## 2016-05-31 DIAGNOSIS — C9140 Hairy cell leukemia not having achieved remission: Secondary | ICD-10-CM | POA: Diagnosis not present

## 2016-05-31 DIAGNOSIS — Z79899 Other long term (current) drug therapy: Secondary | ICD-10-CM | POA: Diagnosis not present

## 2016-05-31 DIAGNOSIS — I10 Essential (primary) hypertension: Secondary | ICD-10-CM | POA: Diagnosis not present

## 2016-05-31 DIAGNOSIS — Z87891 Personal history of nicotine dependence: Secondary | ICD-10-CM | POA: Diagnosis not present

## 2016-05-31 DIAGNOSIS — C9001 Multiple myeloma in remission: Secondary | ICD-10-CM | POA: Diagnosis not present

## 2016-05-31 DIAGNOSIS — D46Z Other myelodysplastic syndromes: Secondary | ICD-10-CM

## 2016-05-31 DIAGNOSIS — Z803 Family history of malignant neoplasm of breast: Secondary | ICD-10-CM | POA: Diagnosis not present

## 2016-05-31 DIAGNOSIS — R0602 Shortness of breath: Secondary | ICD-10-CM | POA: Diagnosis not present

## 2016-05-31 DIAGNOSIS — Z9221 Personal history of antineoplastic chemotherapy: Secondary | ICD-10-CM | POA: Diagnosis not present

## 2016-05-31 DIAGNOSIS — Z86010 Personal history of colonic polyps: Secondary | ICD-10-CM | POA: Diagnosis not present

## 2016-05-31 DIAGNOSIS — E538 Deficiency of other specified B group vitamins: Secondary | ICD-10-CM | POA: Diagnosis not present

## 2016-05-31 DIAGNOSIS — Z794 Long term (current) use of insulin: Secondary | ICD-10-CM | POA: Diagnosis not present

## 2016-05-31 DIAGNOSIS — K219 Gastro-esophageal reflux disease without esophagitis: Secondary | ICD-10-CM | POA: Diagnosis not present

## 2016-05-31 DIAGNOSIS — E11319 Type 2 diabetes mellitus with unspecified diabetic retinopathy without macular edema: Secondary | ICD-10-CM | POA: Diagnosis not present

## 2016-05-31 LAB — HEMOGLOBIN: Hemoglobin: 8.9 g/dL — ABNORMAL LOW (ref 13.0–18.0)

## 2016-05-31 MED ORDER — EPOETIN ALFA 10000 UNIT/ML IJ SOLN
10000.0000 [IU] | Freq: Once | INTRAMUSCULAR | Status: AC
  Administered 2016-05-31: 10000 [IU] via SUBCUTANEOUS
  Filled 2016-05-31: qty 2

## 2016-06-03 ENCOUNTER — Other Ambulatory Visit: Payer: Self-pay | Admitting: Family Medicine

## 2016-06-04 ENCOUNTER — Telehealth: Payer: Self-pay | Admitting: Family Medicine

## 2016-06-04 NOTE — Telephone Encounter (Signed)
I didn't call patient. I spoke with him last week and told him what Dr. Darnell Level needed him to know and haven't called him again.

## 2016-06-04 NOTE — Telephone Encounter (Signed)
Patient returned Kim's call. °

## 2016-06-07 ENCOUNTER — Inpatient Hospital Stay: Payer: Medicare Other

## 2016-06-07 ENCOUNTER — Other Ambulatory Visit: Payer: Self-pay | Admitting: Family Medicine

## 2016-06-07 VITALS — BP 145/68 | HR 80 | Resp 18

## 2016-06-07 DIAGNOSIS — K5790 Diverticulosis of intestine, part unspecified, without perforation or abscess without bleeding: Secondary | ICD-10-CM | POA: Diagnosis not present

## 2016-06-07 DIAGNOSIS — C9000 Multiple myeloma not having achieved remission: Secondary | ICD-10-CM

## 2016-06-07 DIAGNOSIS — C9001 Multiple myeloma in remission: Secondary | ICD-10-CM | POA: Diagnosis not present

## 2016-06-07 DIAGNOSIS — D72819 Decreased white blood cell count, unspecified: Secondary | ICD-10-CM | POA: Diagnosis not present

## 2016-06-07 DIAGNOSIS — E11319 Type 2 diabetes mellitus with unspecified diabetic retinopathy without macular edema: Secondary | ICD-10-CM | POA: Diagnosis not present

## 2016-06-07 DIAGNOSIS — E538 Deficiency of other specified B group vitamins: Secondary | ICD-10-CM | POA: Diagnosis not present

## 2016-06-07 DIAGNOSIS — Z803 Family history of malignant neoplasm of breast: Secondary | ICD-10-CM | POA: Diagnosis not present

## 2016-06-07 DIAGNOSIS — Z794 Long term (current) use of insulin: Secondary | ICD-10-CM | POA: Diagnosis not present

## 2016-06-07 DIAGNOSIS — Z79899 Other long term (current) drug therapy: Secondary | ICD-10-CM | POA: Diagnosis not present

## 2016-06-07 DIAGNOSIS — Z86010 Personal history of colonic polyps: Secondary | ICD-10-CM | POA: Diagnosis not present

## 2016-06-07 DIAGNOSIS — R0602 Shortness of breath: Secondary | ICD-10-CM | POA: Diagnosis not present

## 2016-06-07 DIAGNOSIS — D469 Myelodysplastic syndrome, unspecified: Secondary | ICD-10-CM | POA: Diagnosis not present

## 2016-06-07 DIAGNOSIS — Z7982 Long term (current) use of aspirin: Secondary | ICD-10-CM | POA: Diagnosis not present

## 2016-06-07 DIAGNOSIS — N400 Enlarged prostate without lower urinary tract symptoms: Secondary | ICD-10-CM | POA: Diagnosis not present

## 2016-06-07 DIAGNOSIS — Z87891 Personal history of nicotine dependence: Secondary | ICD-10-CM | POA: Diagnosis not present

## 2016-06-07 DIAGNOSIS — D649 Anemia, unspecified: Secondary | ICD-10-CM | POA: Diagnosis not present

## 2016-06-07 DIAGNOSIS — D46Z Other myelodysplastic syndromes: Secondary | ICD-10-CM

## 2016-06-07 DIAGNOSIS — Z9221 Personal history of antineoplastic chemotherapy: Secondary | ICD-10-CM | POA: Diagnosis not present

## 2016-06-07 DIAGNOSIS — I10 Essential (primary) hypertension: Secondary | ICD-10-CM | POA: Diagnosis not present

## 2016-06-07 DIAGNOSIS — E785 Hyperlipidemia, unspecified: Secondary | ICD-10-CM | POA: Diagnosis not present

## 2016-06-07 DIAGNOSIS — Z85828 Personal history of other malignant neoplasm of skin: Secondary | ICD-10-CM | POA: Diagnosis not present

## 2016-06-07 DIAGNOSIS — K219 Gastro-esophageal reflux disease without esophagitis: Secondary | ICD-10-CM | POA: Diagnosis not present

## 2016-06-07 DIAGNOSIS — C9141 Hairy cell leukemia, in remission: Secondary | ICD-10-CM

## 2016-06-07 DIAGNOSIS — C9140 Hairy cell leukemia not having achieved remission: Secondary | ICD-10-CM | POA: Diagnosis not present

## 2016-06-07 LAB — HEMOGLOBIN: Hemoglobin: 8.7 g/dL — ABNORMAL LOW (ref 13.0–18.0)

## 2016-06-07 MED ORDER — EPOETIN ALFA 10000 UNIT/ML IJ SOLN
10000.0000 [IU] | Freq: Once | INTRAMUSCULAR | Status: AC
  Administered 2016-06-07: 10000 [IU] via SUBCUTANEOUS
  Filled 2016-06-07: qty 2

## 2016-06-11 ENCOUNTER — Ambulatory Visit (INDEPENDENT_AMBULATORY_CARE_PROVIDER_SITE_OTHER): Payer: Medicare Other | Admitting: Family Medicine

## 2016-06-11 ENCOUNTER — Encounter: Payer: Self-pay | Admitting: Family Medicine

## 2016-06-11 VITALS — BP 124/60 | HR 100 | Temp 98.3°F | Wt 218.2 lb

## 2016-06-11 DIAGNOSIS — C9000 Multiple myeloma not having achieved remission: Secondary | ICD-10-CM

## 2016-06-11 DIAGNOSIS — R011 Cardiac murmur, unspecified: Secondary | ICD-10-CM

## 2016-06-11 DIAGNOSIS — E11319 Type 2 diabetes mellitus with unspecified diabetic retinopathy without macular edema: Secondary | ICD-10-CM | POA: Diagnosis not present

## 2016-06-11 DIAGNOSIS — C9141 Hairy cell leukemia, in remission: Secondary | ICD-10-CM

## 2016-06-11 DIAGNOSIS — Z794 Long term (current) use of insulin: Secondary | ICD-10-CM

## 2016-06-11 DIAGNOSIS — D46Z Other myelodysplastic syndromes: Secondary | ICD-10-CM

## 2016-06-11 DIAGNOSIS — Z7189 Other specified counseling: Secondary | ICD-10-CM | POA: Diagnosis not present

## 2016-06-11 NOTE — Assessment & Plan Note (Signed)
Reviewed sugar log - persistently elevated. Will increase humulin N to 39u then 40u BID barring hypoglycemia. Continue regular insulin 10u with meals. Reassess at 6 wk f/u visit. Pt agrees with plan.Marland Kitchen

## 2016-06-11 NOTE — Assessment & Plan Note (Signed)
Advanced directives: discussed, has not set up, packet provided last visit, 5 wishes today. Would want wife then son to be HCPOA. Full code, ok for temporary measures but doesn't want prolonged life support. Is organ donor.

## 2016-06-11 NOTE — Assessment & Plan Note (Signed)
Appreciate onc care.  

## 2016-06-11 NOTE — Patient Instructions (Addendum)
Advanced directive packet provided today. Take a look at it.  Let's increase humulin N to 39 units twice daily for 1 week and if no low sugars, increase to 40 units twice daily. Continue 10 units of regular insulin with meals.  Return first week of December for diabetes follow up visit.

## 2016-06-11 NOTE — Progress Notes (Signed)
BP 124/60   Pulse 100   Temp 98.3 F (36.8 C) (Oral)   Wt 218 lb 4 oz (99 kg)   BMI 35.77 kg/m    CC: CPE - converted to DM f/u visit Subjective:    Patient ID: Alex Smith, male    DOB: 01/17/1935, 81 y.o.   MRN: [id number]  HPI: Alex Smith is a 81 y.o. male presenting on 06/11/2016 for Annual Exam   Saw Alex Smith earlier in the month for medicare wellness visit, note reviewed.   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 Per [facility] onc recommendations, planning to start on revlimid + decadron 66m once weekly - planning on starting late November.   We increased humulin N to 37u twice daily with meals and continued regular insulin 10u with meals (he only eats breakfast and dinner), and he brought log which was reviewed - fasting 1540-250, before dinner 130-280, 2 hours after dinner 180-300.   More trouble with sugar control over last 6 months. Staying fatigued.   Works part time at [employer] to 27 hrs /wk.   Did receive high dose flu shot this year.   Preventative:  [facility] 2014 - [facility]. Released from Alex. [facility] for BPH as he was doing well. Denies urinary issues.  Flu shot - yearly Pneumovax 2006, prevnar 2016. Td 1997  zostavax - declines. Currently on acyclovir. Would want to check with onc re: this. Advanced directives: discussed, has not set up, packet provided last visit, 5 wishes today. Would want wife then son to be HCPOA. Full code, ok for temporary measures but doesn't want prolonged life support. Is organ donor.  Seat belt use discussed Sunscreen use discussed. No changing moles on skin.  Married >50 yrs, cares for wife. 1 cat  Occupation: was cArt gallery manager works part time for [employer]  Activity: likes to golf  Diet: moderate water, fruits/vegetables daily   Relevant past medical, surgical, family and social history reviewed and updated as  indicated. Interim medical history since our last visit reviewed. Allergies and medications reviewed and updated. Current Outpatient Prescriptions on File Prior to Visit  Medication Sig  . acyclovir (ZOVIRAX) 400 MG tablet TAKE TWO TABLETS BY MOUTH TWICE DAILY  . aspirin EC 81 MG tablet Take 81 mg by mouth daily.  .Marland Kitchenglucose blood (ACCU-CHEK AVIVA PLUS) test strip Use to check sugar twice daily Dx:E11.9  . HUMULIN R 100 UNIT/ML injection INJCET 10 UNITS SUBCUTANEOUSLY TWICE DAILY BEFORE MEALS  . hydrochlorothiazide (HYDRODIURIL) 25 MG tablet TAKE ONE TABLET EVERY DAY  . lidocaine-prilocaine (EMLA) cream Apply 1 application topically as needed.  .Marland Kitchenlisinopril (PRINIVIL,ZESTRIL) 20 MG tablet TAKE ONE TABLET TWICE DAILY  . metFORMIN (GLUCOPHAGE) 1000 MG tablet TAKE ONE TABLET BY MOUTH TWICE DAILY AS DIRECTED  . MICROLET LANCETS MISC USE AS DIRECTED  . pravastatin (PRAVACHOL) 20 MG tablet Take 1 tablet (20 mg total) by mouth at bedtime.  . ranitidine (ZANTAC) 150 MG tablet Take 1 tablet (150 mg total) by mouth 2 (two) times daily as needed.  . tamsulosin (FLOMAX) 0.4 MG CAPS capsule Take 0.4 mg by mouth daily.  . temazepam (RESTORIL) 7.5 MG capsule Take 7.5 mg by mouth once.   . vitamin B-12 (CYANOCOBALAMIN) 1000 MCG tablet Take 1,000 mcg by mouth daily.  . vitamin E 400 UNIT capsule Take 200 Units by mouth daily.   .Marland Kitchenlenalidomide (  REVLIMID) 10 MG capsule 10 mg a day for 21 days then 7 days off (Patient not taking: Reported on 06/11/2016)  . traZODone (DESYREL) 50 MG tablet Take 0.5-1 tablets (25-50 mg total) by mouth at bedtime as needed for sleep. (Patient not taking: Reported on 06/11/2016)   Current Facility-Administered Medications on File Prior to Visit  Medication  . heparin lock flush 100 unit/mL  . sodium chloride 0.9 % injection 10 mL  . sodium chloride 0.9 % injection 10 mL    Review of Systems Per HPI unless specifically indicated in ROS section     Objective:    BP 124/60    Pulse 100   Temp 98.3 F (36.8 C) (Oral)   Wt 218 lb 4 oz (99 kg)   BMI 35.77 kg/m   Wt Readings from Last 3 Encounters:  06/11/16 218 lb 4 oz (99 kg)  05/24/16 216 lb 12 oz (98.3 kg)  05/24/16 218 lb 11.1 oz (99.2 kg)    Physical Exam  Constitutional: He appears well-developed and well-nourished. No distress.  HENT:  Mouth/Throat: Oropharynx is clear and moist. No oropharyngeal exudate.  Eyes: Pupils are equal, round, and reactive to light.  Cardiovascular: Normal rate, regular rhythm and intact distal pulses.   Murmur (1/6 SEM) heard. Pulmonary/Chest: Effort normal and breath sounds normal. No respiratory distress. He has no wheezes. He has no rales.  Musculoskeletal: He exhibits no edema.  Skin: Skin is warm and dry. No rash noted.  Psychiatric: He has a normal mood and affect.  Nursing note and vitals reviewed.  Results for orders placed or performed in visit on 06/07/16  Hemoglobin  Result Value Ref Range   Hemoglobin 8.7 (L) 13.0 - 18.0 g/dL      Assessment & Plan:   Problem List Items Addressed This Visit    Advanced care planning/counseling discussion    Advanced directives: discussed, has not set up, packet provided last visit, 5 wishes today. Would want wife then son to be HCPOA. Full code, ok for temporary measures but doesn't want prolonged life support. Is organ donor.       Hairy cell leukemia, in remission ([place])    Followed by onc      Multiple myeloma ([place])    Followed by onc. To start decadron/revlimid in November.       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      Type 2 diabetes, controlled, with retinopathy ([place]) - Primary    Reviewed sugar log - persistently elevated. Will increase humulin N to 39u then 40u BID barring hypoglycemia. Continue regular insulin 10u with meals. Reassess at 6 wk f/u visit. Pt agrees with plan..      Relevant Medications   insulin NPH Human  (HUMULIN N) 100 UNIT/ML injection    Other Visit Diagnoses   None.      Follow up plan: Return in about 6 weeks (around 07/23/2016) for follow up visit.  Ria Bush, MD

## 2016-06-11 NOTE — Assessment & Plan Note (Signed)
Followed by onc.

## 2016-06-11 NOTE — Assessment & Plan Note (Signed)
Continue to monitor

## 2016-06-11 NOTE — Assessment & Plan Note (Signed)
Followed by onc. To start decadron/revlimid in November.

## 2016-06-11 NOTE — Progress Notes (Signed)
Pre visit review using our clinic review tool, if applicable. No additional management support is needed unless otherwise documented below in the visit note. 

## 2016-06-14 ENCOUNTER — Other Ambulatory Visit: Payer: Self-pay | Admitting: *Deleted

## 2016-06-14 ENCOUNTER — Inpatient Hospital Stay: Payer: Medicare Other

## 2016-06-14 VITALS — BP 149/64 | HR 78

## 2016-06-14 DIAGNOSIS — C9141 Hairy cell leukemia, in remission: Secondary | ICD-10-CM

## 2016-06-14 DIAGNOSIS — Z7982 Long term (current) use of aspirin: Secondary | ICD-10-CM | POA: Diagnosis not present

## 2016-06-14 DIAGNOSIS — Z87891 Personal history of nicotine dependence: Secondary | ICD-10-CM | POA: Diagnosis not present

## 2016-06-14 DIAGNOSIS — Z803 Family history of malignant neoplasm of breast: Secondary | ICD-10-CM | POA: Diagnosis not present

## 2016-06-14 DIAGNOSIS — C9140 Hairy cell leukemia not having achieved remission: Secondary | ICD-10-CM | POA: Diagnosis not present

## 2016-06-14 DIAGNOSIS — R0602 Shortness of breath: Secondary | ICD-10-CM | POA: Diagnosis not present

## 2016-06-14 DIAGNOSIS — K219 Gastro-esophageal reflux disease without esophagitis: Secondary | ICD-10-CM | POA: Diagnosis not present

## 2016-06-14 DIAGNOSIS — D469 Myelodysplastic syndrome, unspecified: Secondary | ICD-10-CM | POA: Diagnosis not present

## 2016-06-14 DIAGNOSIS — D649 Anemia, unspecified: Secondary | ICD-10-CM | POA: Diagnosis not present

## 2016-06-14 DIAGNOSIS — D46Z Other myelodysplastic syndromes: Secondary | ICD-10-CM

## 2016-06-14 DIAGNOSIS — Z95828 Presence of other vascular implants and grafts: Secondary | ICD-10-CM

## 2016-06-14 DIAGNOSIS — I10 Essential (primary) hypertension: Secondary | ICD-10-CM | POA: Diagnosis not present

## 2016-06-14 DIAGNOSIS — E11319 Type 2 diabetes mellitus with unspecified diabetic retinopathy without macular edema: Secondary | ICD-10-CM | POA: Diagnosis not present

## 2016-06-14 DIAGNOSIS — Z9221 Personal history of antineoplastic chemotherapy: Secondary | ICD-10-CM | POA: Diagnosis not present

## 2016-06-14 DIAGNOSIS — D72819 Decreased white blood cell count, unspecified: Secondary | ICD-10-CM | POA: Diagnosis not present

## 2016-06-14 DIAGNOSIS — N400 Enlarged prostate without lower urinary tract symptoms: Secondary | ICD-10-CM | POA: Diagnosis not present

## 2016-06-14 DIAGNOSIS — E538 Deficiency of other specified B group vitamins: Secondary | ICD-10-CM | POA: Diagnosis not present

## 2016-06-14 DIAGNOSIS — K5790 Diverticulosis of intestine, part unspecified, without perforation or abscess without bleeding: Secondary | ICD-10-CM | POA: Diagnosis not present

## 2016-06-14 DIAGNOSIS — E785 Hyperlipidemia, unspecified: Secondary | ICD-10-CM | POA: Diagnosis not present

## 2016-06-14 DIAGNOSIS — Z79899 Other long term (current) drug therapy: Secondary | ICD-10-CM | POA: Diagnosis not present

## 2016-06-14 DIAGNOSIS — C9000 Multiple myeloma not having achieved remission: Secondary | ICD-10-CM

## 2016-06-14 DIAGNOSIS — Z794 Long term (current) use of insulin: Secondary | ICD-10-CM | POA: Diagnosis not present

## 2016-06-14 DIAGNOSIS — C9001 Multiple myeloma in remission: Secondary | ICD-10-CM | POA: Diagnosis not present

## 2016-06-14 DIAGNOSIS — Z86010 Personal history of colonic polyps: Secondary | ICD-10-CM | POA: Diagnosis not present

## 2016-06-14 DIAGNOSIS — Z85828 Personal history of other malignant neoplasm of skin: Secondary | ICD-10-CM | POA: Diagnosis not present

## 2016-06-14 LAB — HEMOGLOBIN: Hemoglobin: 8.4 g/dL — ABNORMAL LOW (ref 13.0–18.0)

## 2016-06-14 MED ORDER — SODIUM CHLORIDE 0.9% FLUSH
10.0000 mL | INTRAVENOUS | Status: DC | PRN
  Administered 2016-06-14: 10 mL via INTRAVENOUS
  Filled 2016-06-14: qty 10

## 2016-06-14 MED ORDER — HEPARIN SOD (PORK) LOCK FLUSH 100 UNIT/ML IV SOLN
500.0000 [IU] | Freq: Once | INTRAVENOUS | Status: AC
  Administered 2016-06-14: 500 [IU] via INTRAVENOUS

## 2016-06-14 MED ORDER — EPOETIN ALFA 10000 UNIT/ML IJ SOLN
10000.0000 [IU] | Freq: Once | INTRAMUSCULAR | Status: AC
  Administered 2016-06-14: 10000 [IU] via SUBCUTANEOUS
  Filled 2016-06-14: qty 2

## 2016-06-19 ENCOUNTER — Other Ambulatory Visit: Payer: Self-pay | Admitting: *Deleted

## 2016-06-19 ENCOUNTER — Telehealth: Payer: Self-pay | Admitting: *Deleted

## 2016-06-19 MED ORDER — LENALIDOMIDE 10 MG PO CAPS: ORAL_CAPSULE | ORAL | 2 refills | Status: DC

## 2016-06-19 NOTE — Telephone Encounter (Signed)
Debbie called from [place] to request a new prescription for Revlimid be sent.  Faxed to [facility].The currentt prescription will expire in 4 days.  New prescription faxed.

## 2016-06-26 ENCOUNTER — Other Ambulatory Visit: Payer: Self-pay | Admitting: Hematology and Oncology

## 2016-06-26 NOTE — Progress Notes (Signed)
Patient on plan of care prior to pathways. 

## 2016-06-26 NOTE — Progress Notes (Signed)
START ON PATHWAY REGIMEN - MDS  [id number]: Lenalidomide 10 mg D1-21 q28 Days + Darbepoetin 200 mcg q2 Weeks   A cycle is every 28 days:     Lenalidomide (Revlimid(R)) 10 mg flat dose orally once daily on days 1-21 only, followed by 7 days off Dose Mod: None     Darbepoetin (Aranesp(R)) 200 mcg flat dose subcutaneously every two weeks Dose Mod: None Additional Orders: Thromboprophylaxis is recommended with lenalidomide; assess patient-specific risk factors and consider appropriate prophylaxis as warranted. If no response to ESA after 6 weeks, may consider a higher dose.  **Always confirm dose/schedule in your pharmacy ordering system**    Patient Characteristics: Low - Intermediate-1, Second Line, Has Not Yet Had Azacitidine WHO Classification: RCMD Risk Category: Low - Intermediate-1 Line of therapy: Second Line Prior Azacitidine? Has Not Yet Had Azacitidine  Intent of Therapy: Non-Curative / Palliative Intent, Discussed with Patient

## 2016-06-28 ENCOUNTER — Encounter: Payer: Self-pay | Admitting: Hematology and Oncology

## 2016-06-28 ENCOUNTER — Telehealth: Payer: Self-pay | Admitting: Family Medicine

## 2016-06-28 ENCOUNTER — Encounter: Payer: Self-pay | Admitting: *Deleted

## 2016-06-28 ENCOUNTER — Inpatient Hospital Stay (HOSPITAL_BASED_OUTPATIENT_CLINIC_OR_DEPARTMENT_OTHER): Payer: Medicare Other | Admitting: Hematology and Oncology

## 2016-06-28 ENCOUNTER — Inpatient Hospital Stay: Payer: Medicare Other | Attending: Hematology and Oncology

## 2016-06-28 VITALS — BP 132/63 | HR 73 | Temp 97.2°F | Resp 18 | Wt 220.7 lb

## 2016-06-28 DIAGNOSIS — C9140 Hairy cell leukemia not having achieved remission: Secondary | ICD-10-CM | POA: Diagnosis not present

## 2016-06-28 DIAGNOSIS — Z85828 Personal history of other malignant neoplasm of skin: Secondary | ICD-10-CM

## 2016-06-28 DIAGNOSIS — Z7982 Long term (current) use of aspirin: Secondary | ICD-10-CM | POA: Diagnosis not present

## 2016-06-28 DIAGNOSIS — R0602 Shortness of breath: Secondary | ICD-10-CM

## 2016-06-28 DIAGNOSIS — E785 Hyperlipidemia, unspecified: Secondary | ICD-10-CM

## 2016-06-28 DIAGNOSIS — C9000 Multiple myeloma not having achieved remission: Secondary | ICD-10-CM

## 2016-06-28 DIAGNOSIS — R5383 Other fatigue: Secondary | ICD-10-CM | POA: Diagnosis not present

## 2016-06-28 DIAGNOSIS — Z794 Long term (current) use of insulin: Secondary | ICD-10-CM | POA: Diagnosis not present

## 2016-06-28 DIAGNOSIS — K219 Gastro-esophageal reflux disease without esophagitis: Secondary | ICD-10-CM

## 2016-06-28 DIAGNOSIS — N400 Enlarged prostate without lower urinary tract symptoms: Secondary | ICD-10-CM

## 2016-06-28 DIAGNOSIS — D649 Anemia, unspecified: Secondary | ICD-10-CM

## 2016-06-28 DIAGNOSIS — Z87891 Personal history of nicotine dependence: Secondary | ICD-10-CM | POA: Diagnosis not present

## 2016-06-28 DIAGNOSIS — R011 Cardiac murmur, unspecified: Secondary | ICD-10-CM | POA: Insufficient documentation

## 2016-06-28 DIAGNOSIS — Z803 Family history of malignant neoplasm of breast: Secondary | ICD-10-CM | POA: Insufficient documentation

## 2016-06-28 DIAGNOSIS — E539 Vitamin B deficiency, unspecified: Secondary | ICD-10-CM | POA: Diagnosis not present

## 2016-06-28 DIAGNOSIS — I10 Essential (primary) hypertension: Secondary | ICD-10-CM | POA: Insufficient documentation

## 2016-06-28 DIAGNOSIS — Z8619 Personal history of other infectious and parasitic diseases: Secondary | ICD-10-CM | POA: Insufficient documentation

## 2016-06-28 DIAGNOSIS — K5790 Diverticulosis of intestine, part unspecified, without perforation or abscess without bleeding: Secondary | ICD-10-CM | POA: Diagnosis not present

## 2016-06-28 DIAGNOSIS — E1165 Type 2 diabetes mellitus with hyperglycemia: Secondary | ICD-10-CM

## 2016-06-28 DIAGNOSIS — C9141 Hairy cell leukemia, in remission: Secondary | ICD-10-CM

## 2016-06-28 DIAGNOSIS — D46Z Other myelodysplastic syndromes: Secondary | ICD-10-CM | POA: Diagnosis not present

## 2016-06-28 DIAGNOSIS — Z8701 Personal history of pneumonia (recurrent): Secondary | ICD-10-CM

## 2016-06-28 DIAGNOSIS — Z86010 Personal history of colonic polyps: Secondary | ICD-10-CM | POA: Diagnosis not present

## 2016-06-28 DIAGNOSIS — Z9221 Personal history of antineoplastic chemotherapy: Secondary | ICD-10-CM

## 2016-06-28 DIAGNOSIS — D539 Nutritional anemia, unspecified: Secondary | ICD-10-CM

## 2016-06-28 LAB — COMPREHENSIVE METABOLIC PANEL
ALT: 19 U/L (ref 17–63)
AST: 21 U/L (ref 15–41)
Albumin: 3.8 g/dL (ref 3.5–5.0)
Alkaline Phosphatase: 55 U/L (ref 38–126)
Anion gap: 6 (ref 5–15)
BUN: 36 mg/dL — ABNORMAL HIGH (ref 6–20)
CO2: 24 mmol/L (ref 22–32)
Calcium: 8.9 mg/dL (ref 8.9–10.3)
Chloride: 105 mmol/L (ref 101–111)
Creatinine, Ser: 1.53 mg/dL — ABNORMAL HIGH (ref 0.61–1.24)
GFR calc Af Amer: 48 mL/min — ABNORMAL LOW (ref >60)
GFR calc non Af Amer: 42 mL/min — ABNORMAL LOW (ref >60)
Glucose, Bld: 223 mg/dL — ABNORMAL HIGH (ref 65–99)
Potassium: 4.3 mmol/L (ref 3.5–5.1)
Sodium: 135 mmol/L (ref 135–145)
Total Bilirubin: 0.7 mg/dL (ref 0.3–1.2)
Total Protein: 7 g/dL (ref 6.5–8.1)

## 2016-06-28 LAB — CBC WITH DIFFERENTIAL/PLATELET
Basophils Absolute: 0 10*3/uL (ref 0–0.1)
Basophils Relative: 1 %
Eosinophils Absolute: 0.1 10*3/uL (ref 0–0.7)
Eosinophils Relative: 4 %
HCT: 26.2 % — ABNORMAL LOW (ref 40.0–52.0)
Hemoglobin: 9 g/dL — ABNORMAL LOW (ref 13.0–18.0)
Lymphocytes Relative: 18 %
Lymphs Abs: 0.4 10*3/uL — ABNORMAL LOW (ref 1.0–3.6)
MCH: 38.8 pg — ABNORMAL HIGH (ref 26.0–34.0)
MCHC: 34.2 g/dL (ref 32.0–36.0)
MCV: 113.4 fL — ABNORMAL HIGH (ref 80.0–100.0)
Monocytes Absolute: 0.1 10*3/uL — ABNORMAL LOW (ref 0.2–1.0)
Monocytes Relative: 6 %
Neutro Abs: 1.5 10*3/uL (ref 1.4–6.5)
Neutrophils Relative %: 71 %
Platelets: 92 10*3/uL — ABNORMAL LOW (ref 150–440)
RBC: 2.31 MIL/uL — ABNORMAL LOW (ref 4.40–5.90)
RDW: 15.6 % — ABNORMAL HIGH (ref 11.5–14.5)
WBC: 2.2 10*3/uL — ABNORMAL LOW (ref 3.8–10.6)

## 2016-06-28 LAB — MAGNESIUM: Magnesium: 2.3 mg/dL (ref 1.7–2.4)

## 2016-06-28 LAB — URIC ACID: Uric Acid, Serum: 5.7 mg/dL (ref 4.4–7.6)

## 2016-06-28 LAB — LACTATE DEHYDROGENASE: LDH: 184 U/L (ref 98–192)

## 2016-06-28 MED ORDER — DEXAMETHASONE 4 MG PO TABS: 20.0000 mg | ORAL_TABLET | ORAL | 1 refills | Status: DC

## 2016-06-28 NOTE — Telephone Encounter (Signed)
Spoke with Dr [name]. Planning starting revlimid daily with decadron once a week on Saturdays.  plz call patient -  Recommend continue NPH (39 units twice daily) and regular insulin dosing (currently 10u twice daily with meals). Add sliding scale regular insulin to mealtime insulin BID AC as follows on day of and day after decadron: no extra insulin of cbg <150. 1 unit if cbg 150-200. 2 units if 200-250. 3 units if cbg 250-300. 4 units if cbg 300-350, 5 units of >350. SSI will only be for Saturday and Sunday.  Have him keep track of sugars three times a day over weekend (either before or 2 hours after a meal) and drop off log on Monday to review. Will titrate accordingly.

## 2016-06-28 NOTE — Progress Notes (Signed)
Patient here today to discuss oral chemotherapy drug.

## 2016-06-28 NOTE — Telephone Encounter (Signed)
Message left for patient to return my call.  

## 2016-06-28 NOTE — Progress Notes (Signed)
[facility] day:  06/28/2016   Chief Complaint: Alex Smith is a 81 y.o. male with a myelodysplastic marrow, smoldering myeloma, and a history of hairy cell leukemia who is seen for 1 month assessment on Procrit.  HPI:  The patient was last seen in the medical oncology clinic on 05/24/2016.  At that time, we discussed continuation of Procrit until initiation of Revlimid.  He has continued weekly Procrit (last 06/14/2016).  Hemoglobin has ranged from 8.4 - 8.9.  He received his Revlimid today. He spoke with Dr. Danise Mina regarding the management of his diabetes on Decadron.  Symptomatically, he denies any new complaints.  He denies any fevers, sweats or weight loss.  He denies any bone pain.  Energy level is fair.  His wife injured her shoulder.   Past Medical History:  Diagnosis Date  . Anemia   . B12 deficiency   . Basal cell carcinoma of face   . BPH (benign prostatic hypertrophy)    followed by urology, discharged (Dr. Bernardo Heater)  . CAP (community acquired pneumonia) 02/15/2015  . Colon polyps   . Diverticulosis   . GERD (gastroesophageal reflux disease)   . Hairy cell leukemia ([place]) 2006   recurrent, seizure on rituxan, now on cladribine ([place])  . History of pneumonia 2000's   "once" (07/07/2012)  . History of shingles   . HLD (hyperlipidemia)   . Hypertension   . Pneumonia   . Shortness of breath dyspnea   . Systolic murmur 55/73/2202  . Type 2 diabetes, controlled, with retinopathy ([place])     Past Surgical History:  Procedure Laterality Date  . [place] VITRECTOMY WITH 20 GAUGE MVR PORT FOR MACULAR HOLE  07/07/2012   Procedure: 25 GAUGE PARS PLANA VITRECTOMY WITH 20 GAUGE MVR PORT FOR MACULAR HOLE;  Surgeon: Hayden Pedro, MD;  Location: [facility];  Service: Ophthalmology;  Laterality: Left;  . BONE MARROW BIOPSY  2016  . CARDIOVASCULAR STRESS TEST  2013   treadmill - no evidence ischemia, EF 61%  . CATARACT  EXTRACTION W/ INTRAOCULAR LENS  IMPLANT, BILATERAL  ~ 2010  . COLONOSCOPY  2014   [place] WNL no rpt needed, h/o polyps  . EYE SURGERY Left 06/2012   laser surgery  . GAS INSERTION  07/07/2012   Procedure: INSERTION OF GAS;  Surgeon: Hayden Pedro, MD;  Location: [facility];  Service: Ophthalmology;  Laterality: Left;  [id number]  . PERIPHERAL VASCULAR CATHETERIZATION N/A 02/23/2015   Procedure: Glori Luis Cath Insertion;  Surgeon: Algernon Huxley, MD;  Location: [facility];  Service: Cardiovascular;  Laterality: N/A;  . SERUM PATCH  07/07/2012   Procedure: SERUM PATCH;  Surgeon: Hayden Pedro, MD;  Location: [facility];  Service: Ophthalmology;  Laterality: Left;  . SKIN CANCER EXCISION     "all over my face" (07/07/2012)    Family History  Problem Relation Age of Onset  . Dementia Mother   . Heart failure Father 47  . Cancer Sister     breast  . Diabetes Paternal Uncle   . Diabetes Paternal Aunt   . CAD Brother 83    MI  . Stroke Neg Hx     Social History:  reports that he quit smoking about 47 years ago. His smoking use included Cigarettes. He has a 50.00 pack-year smoking history. He has never used smokeless tobacco. He reports that he drinks alcohol. He reports that he does not use drugs.  He works at [employer] 20-32 hours/week.  The patient 's wife has had 3 hip operations.  She underwent hip replacement 03/06/2016.  She recently hurt her shoulder.  He takes care of his wife.  He states that they either eat out or bring food in.  His best contact number is his cell: [phone].  He is alone today.  Allergies:  Allergies  Allergen Reactions  . Rituximab Rash    Chest tightness  . Blood-Group Specific Substance Other (See Comments)    Had a post transfusion reaction of red blood cells; NOW REQUIRES WASHED BLOOD CELLS  . Primaxin [Imipenem] Other (See Comments)    Possible allergy  . Sulfa Antibiotics Itching and Rash  Possible allergy to Zoloft, allopurinol, and a chemotherapy  medication.  He tolerates Dapsone.  Current Medications: Current Outpatient Prescriptions  Medication Sig Dispense Refill  . acyclovir (ZOVIRAX) 400 MG tablet TAKE TWO TABLETS BY MOUTH TWICE DAILY 120 tablet 0  . aspirin EC 81 MG tablet Take 81 mg by mouth daily.    Marland Kitchen glucose blood (ACCU-CHEK AVIVA PLUS) test strip Use to check sugar twice daily Dx:E11.9 200 each 3  . HUMULIN R 100 UNIT/ML injection INJCET 10 UNITS SUBCUTANEOUSLY TWICE DAILY BEFORE MEALS 10 mL 11  . hydrochlorothiazide (HYDRODIURIL) 25 MG tablet TAKE ONE TABLET EVERY DAY 30 tablet 11  . insulin NPH Human (HUMULIN N) 100 UNIT/ML injection Inject 0.39 mLs (39 Units total) into the skin 2 (two) times daily before a meal.    . lidocaine-prilocaine (EMLA) cream Apply 1 application topically as needed. 30 g 3  . lisinopril (PRINIVIL,ZESTRIL) 20 MG tablet TAKE ONE TABLET TWICE DAILY 60 tablet 6  . metFORMIN (GLUCOPHAGE) 1000 MG tablet TAKE ONE TABLET BY MOUTH TWICE DAILY AS DIRECTED 180 tablet 3  . MICROLET LANCETS MISC USE AS DIRECTED 200 each 3  . pravastatin (PRAVACHOL) 20 MG tablet Take 1 tablet (20 mg total) by mouth at bedtime. 90 tablet 2  . ranitidine (ZANTAC) 150 MG tablet Take 1 tablet (150 mg total) by mouth 2 (two) times daily as needed. 180 tablet 3  . tamsulosin (FLOMAX) 0.4 MG CAPS capsule Take 0.4 mg by mouth daily.    . temazepam (RESTORIL) 7.5 MG capsule Take 7.5 mg by mouth once.     . vitamin B-12 (CYANOCOBALAMIN) 1000 MCG tablet Take 1,000 mcg by mouth daily.    . vitamin E 400 UNIT capsule Take 200 Units by mouth daily.     Marland Kitchen lenalidomide (REVLIMID) 10 MG capsule 10 mg a day for 21 days then 7 days off 21 capsule 2  . traZODone (DESYREL) 50 MG tablet Take 0.5-1 tablets (25-50 mg total) by mouth at bedtime as needed for sleep. (Patient not taking: Reported on 06/28/2016) 30 tablet 3   No current facility-administered medications for this visit.    Facility-Administered Medications Ordered in Other Visits   Medication Dose Route Frequency Provider Last Rate Last Dose  . heparin lock flush 100 unit/mL  500 Units Intravenous Once Lequita Asal, MD      . sodium chloride 0.9 % injection 10 mL  10 mL Intravenous PRN Lequita Asal, MD   10 mL at 03/03/15 0903  . sodium chloride 0.9 % injection 10 mL  10 mL Intracatheter PRN Lequita Asal, MD   10 mL at 03/10/15 1410   Review of Systems:  GENERAL:  Chronic fatigue.  No fevers or sweats.  Weight up 2 pounds. PERFORMANCE STATUS (ECOG):  1 HEENT: No visual changes, runny nose, sore throat. Pulmonary:  Shortness of breath on exertion.  Cough.  No hemoptysis. Cardiac:  No chest pain, palpitations, orthopnea, or PND. GI:  No nausea, vomiting, diarrhea, constipation, melena or hematochezia.  Drinks Boost. GU:  No urgency, frequency, dysuria, or hematuria. Musculoskeletal:  No back pain.  No joint pain.  No muscle tenderness. Extremities:  No pain or swelling. Skin:  No rashes or skin changes. Neuro:  No headache, numbness or weakness, balance or coordination issues. Endocrine:  Diabetes.  No thyroid issues, hot flashes or night sweats. Psych:  No mood changes, depression or anxiety.  Pain:  No focal pain. Review of systems:  All other systems reviewed and found to be negative.   Physical Exam: Blood pressure 132/63, pulse 73, temperature 97.2 F (36.2 C), temperature source Tympanic, resp. rate 18, weight 220 lb 10.9 oz (100.1 kg).  GENERAL:  Well developed, well nourished, gentleman sitting comfortably in the exam room in no acute distress. MENTAL STATUS:  Alert and oriented to person, place and time. HEAD:  Pearline Cables whitet hair.  Normocephalic, atraumatic, face symmetric, no Cushingoid features. EYES:  Glasses. Blue eyes.  Pupils equal round and reactive to light and accomodation.  No conjunctivitis or scleral icterus. ENT:  Oropharynx clear without lesion.  Tongue normal. Mucous membranes moist.  RESPIRATORY:  Clear to auscultation  without rales, wheezes or rhonchi. CARDIOVASCULAR:  Regular rate and rhythm without murmur, rub or gallop. ABDOMEN:  Soft, non-tender, with active bowel sounds, and no hepatosplenomegaly.  No masses. SKIN:  No rashes or ulcers. EXTREMITIES: No edema, no skin discoloration or tenderness.  No palpable cords. LYMPH NODES: No palpable cervical, supraclavicular, axillary or inguinal adenopathy  NEUROLOGICAL: Unremarkable. PSYCH:  Appropriate.    Appointment on 06/28/2016  Component Date Value Ref Range Status  . WBC 06/28/2016 2.2* 3.8 - 10.6 K/uL Final  . RBC 06/28/2016 2.31* 4.40 - 5.90 MIL/uL Final  . Hemoglobin 06/28/2016 9.0* 13.0 - 18.0 g/dL Final  . HCT 06/28/2016 26.2* 40.0 - 52.0 % Final  . MCV 06/28/2016 113.4* 80.0 - 100.0 fL Final  . MCH 06/28/2016 38.8* 26.0 - 34.0 pg Final  . MCHC 06/28/2016 34.2  32.0 - 36.0 g/dL Final  . RDW 06/28/2016 15.6* 11.5 - 14.5 % Final  . Platelets 06/28/2016 92* 150 - 440 K/uL Final  . Neutrophils Relative % 06/28/2016 71  % Final  . Neutro Abs 06/28/2016 1.5  1.4 - 6.5 K/uL Final  . Lymphocytes Relative 06/28/2016 18  % Final  . Lymphs Abs 06/28/2016 0.4* 1.0 - 3.6 K/uL Final  . Monocytes Relative 06/28/2016 6  % Final  . Monocytes Absolute 06/28/2016 0.1* 0.2 - 1.0 K/uL Final  . Eosinophils Relative 06/28/2016 4  % Final  . Eosinophils Absolute 06/28/2016 0.1  0 - 0.7 K/uL Final  . Basophils Relative 06/28/2016 1  % Final  . Basophils Absolute 06/28/2016 0.0  0 - 0.1 K/uL Final    Assessment:  Alex Smith is a 81 y.o. 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  He was diagnosed with hairy cell leukemia in 2006.  He received cladribine 11/06-11/05/2005.  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   Bone marrow aspirate and biopsy on 02/07/2015 revealed a extensive marrow involvement by recurrent hairy cell leukemia (approximately 80% of cells in the core). There was a monoclonal plasma cell infiltrate  (approximately 10%), compatible with a plasma cell neoplasm. Marrow was hypercellular (40-50%)  with markedly diminished residual trilineage hematopoiesis and mild multilineage dyspoiesis.  Peripheral smear revealed leukopenia (WBC 1900) with 2% atypical lymphoid cells compatible with hairy cell leukemia. There was moderate anemia with anisopoikilocytosis. 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  FISH studies for MDS were negative.  Cytogenetics were normal (46, XY).  He has an unclear allergy history.  He may be allergic to Primaxin, Zoloft, allopurinol, and a chemotherapy medication.  Reaction occurred in 06/2005.  He is allergic to sulfa.  He is not allergic to dapsone.  He requires washed RBCs.  He is on prophylactic acyclovir.  Dapsone and voriconazole were discontinued.  He has a history of shingles prior to prophylactic acyclovir.  He is 13 months status post cladribine (began 03/03/2015).  His counts plateaued on 07/25/2015 and have subsequently decreased.  Labs on 07/25/2015 revealed a normal B12 (894), folate (60.5), and TSH (2.562).  Ferritin was 52.  Bone marrow on 11/03/2015 revealed persistent plasma cell neoplasm, now with mildy atypical monoclonal plasma cells estimated at 20-30%.   There was no morphologic evidence of residual hairy cell leukemia.  There was a minute population (0.03%) with hairy cell immunophenotype detected by flow.  Marrow was normocellular for age with relative erythroid hyperplasia, relative myeloid hypoplasia, mild dyspoiesis, adequate megakaryocytes and no increased blasts.  There was no significant increase in marrow reticulin fibers.  Iron was present.  FISH studies for MDS were negative.  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  Cytogenetics were normal (46, XY).  Bone marrow on 03/13/2016 was normocellular to marginally hypercellular for age  (20-40%) with relative erythroid hyperplasia and mild dyserythropoiesis, decreased granulopoiesis and adequate megakaryocytes.  There was a persistent 15-20% plasma cell neoplasm.  There was no overt increase in reticulin fibrosis.  Storage iron was present.  Flow cytometry revealed abnormal/monocytic plasma cell population (3% sample), consistent with a plasma cell neoplastic process.  Cytogenetics were normal (46, XY).  FISH studies for myeloma revealed a CCND1/IGH translocation, t(11;14) in 53% of nuclei.  SPEP on 11/17/2015 revealed a 1.3 gm/dL monoclonal spike (1.4 gm/dL on 07/25/2015 and 1.0 on 02/03/2015).  Kappa free light chains were 50.51, lambda free light chains were 10.48 with a ratio of 4.82 (ratio 3.68 on 07/25/2015).  Beta2-microglobulin was 2.5.  24 hour urine on 11/21/2015 revealed 234.8 mg protein in 24 hours (no monoclonal protein).  Bone survey on 11/24/2015 revealed no lytic lesions.  Epo level was 329.6 on 03/29/2016.  He began a trial of Procrit.  He received 10,000 units of Procrit weekly (04/26/2016 - 06/14/2016).  Hemoglobin ranged between 8.4 and 8.9 without trend.    PET scan on 03/14/2016 revealed no evidence of hypermetabolic lymphadenopathy or focal skeletal lesions to suggest marrow or cortical bone involvement.  Symptomatically, he has chronic fatigue.  Exam is stable.  Hemoglobin is 26.2 with a hemoglobin of 9.0.  Plan: 1.  Labs today:  CBC with diff, CMP, SPEP, LDH, uric acid, Mg 2.  Discuss plan to initiate Revlimid 10 mg a day (day 1-21 with 7 days off) and Decadron 20 mg po q week.  Discuss initiating treatment tomorrow.  Discuss side effects of Revlimid and Decadron.  Discuss monitoring blood sugar.  Discuss follow-up with Dr. Danise Mina prior to initiating steroids as Decadron only once a week (not daily).  Anticipate blood sugar high x 1-2 days after Decadron each week.  Discuss a baby aspirin for  clot prophylaxis.  Discuss discontinuation of Procrit. 3.   Discontinue Procrit. 4.  Begin Revlimid 10 mg po q day 1-21 followed by 7 days off (28 day cycle). 5.  Begin Decadron 20 mg po q week (day 1,8,15, and 22 of a 28 day cycle). 6.  Aspirin 81 mg a day for clot prophylaxis. 8.  Anticipate weekly CBC with diff with cycle #1 Revlimid and Decadron (consider GCSF support to maintain ANC > 1000). 9.  Phone follow-up with Dr. Ria Bush re: Decadron induced hyperglycemia- done. 10.  Nursing education with Moishe Spice, RN today. 11.  RTC 11/17, 11/22 for labs (CBC with diff). 12.  RTC on 07/19/2016 for MD assessment and labs (CBC with diff, CMP, hold tube).   Lequita Asal, MD  06/28/2016, 12:30 PM

## 2016-06-28 NOTE — Progress Notes (Unsigned)
Pt given edcuation on Revlimid 10 mg tablets given day 1 -21 with 1 week break and repeat cycle. Dexamethasone 4 mg tablets and take 5 tablets once a week every week. Pt. Was educated on how to take medicine and take same time each day.  Also side effects possible lowering wbc, plt and hgb. Fatigue, n/v, constipation/diarrhea, rash , to name a few.  Patient was given the education paper that has all listed side effects.  Patient is getting med through [facility] at  [phone].  His drug was delivered to him today and pt to start med in am. Pt asked questions and they were answered. Consent was signed and copy given to pt.

## 2016-07-01 LAB — PROTEIN ELECTROPHORESIS, SERUM
A/G Ratio: 1.1 (ref 0.7–1.7)
Albumin ELP: 3.4 g/dL (ref 2.9–4.4)
Alpha-1-Globulin: 0.2 g/dL (ref 0.0–0.4)
Alpha-2-Globulin: 0.6 g/dL (ref 0.4–1.0)
Beta Globulin: 0.8 g/dL (ref 0.7–1.3)
Gamma Globulin: 1.4 g/dL (ref 0.4–1.8)
Globulin, Total: 3 g/dL (ref 2.2–3.9)
M-Spike, %: 1.2 g/dL — ABNORMAL HIGH
Total Protein ELP: 6.4 g/dL (ref 6.0–8.5)

## 2016-07-01 NOTE — Telephone Encounter (Signed)
Patient has to leave for work at 11:30.  Please call patient before 11:30  Patient can be reached at [phone].

## 2016-07-02 NOTE — Telephone Encounter (Signed)
Spoke with patient. He said he didn't start meds until Sunday because he wanted to wait until he was off work. Advised him to do SSI on Sunday and Monday instead of Saturday and Sunday and drop off the sugar log on Tuesday instead of Monday. He wrote down sliding scale and read it back. He said his sugars have been around 120 or less so far.

## 2016-07-05 ENCOUNTER — Inpatient Hospital Stay: Payer: Medicare Other | Admitting: Hematology and Oncology

## 2016-07-05 DIAGNOSIS — K219 Gastro-esophageal reflux disease without esophagitis: Secondary | ICD-10-CM | POA: Diagnosis not present

## 2016-07-05 DIAGNOSIS — K5790 Diverticulosis of intestine, part unspecified, without perforation or abscess without bleeding: Secondary | ICD-10-CM | POA: Diagnosis not present

## 2016-07-05 DIAGNOSIS — Z86010 Personal history of colonic polyps: Secondary | ICD-10-CM | POA: Diagnosis not present

## 2016-07-05 DIAGNOSIS — N400 Enlarged prostate without lower urinary tract symptoms: Secondary | ICD-10-CM | POA: Diagnosis not present

## 2016-07-05 DIAGNOSIS — D46Z Other myelodysplastic syndromes: Secondary | ICD-10-CM | POA: Diagnosis not present

## 2016-07-05 DIAGNOSIS — Z803 Family history of malignant neoplasm of breast: Secondary | ICD-10-CM | POA: Diagnosis not present

## 2016-07-05 DIAGNOSIS — I10 Essential (primary) hypertension: Secondary | ICD-10-CM | POA: Diagnosis not present

## 2016-07-05 DIAGNOSIS — C9000 Multiple myeloma not having achieved remission: Secondary | ICD-10-CM

## 2016-07-05 DIAGNOSIS — R011 Cardiac murmur, unspecified: Secondary | ICD-10-CM | POA: Diagnosis not present

## 2016-07-05 DIAGNOSIS — R0602 Shortness of breath: Secondary | ICD-10-CM | POA: Diagnosis not present

## 2016-07-05 DIAGNOSIS — R5383 Other fatigue: Secondary | ICD-10-CM | POA: Diagnosis not present

## 2016-07-05 DIAGNOSIS — Z87891 Personal history of nicotine dependence: Secondary | ICD-10-CM | POA: Diagnosis not present

## 2016-07-05 DIAGNOSIS — Z7982 Long term (current) use of aspirin: Secondary | ICD-10-CM | POA: Diagnosis not present

## 2016-07-05 DIAGNOSIS — Z794 Long term (current) use of insulin: Secondary | ICD-10-CM | POA: Diagnosis not present

## 2016-07-05 DIAGNOSIS — E785 Hyperlipidemia, unspecified: Secondary | ICD-10-CM | POA: Diagnosis not present

## 2016-07-05 DIAGNOSIS — Z9221 Personal history of antineoplastic chemotherapy: Secondary | ICD-10-CM | POA: Diagnosis not present

## 2016-07-05 DIAGNOSIS — E1165 Type 2 diabetes mellitus with hyperglycemia: Secondary | ICD-10-CM | POA: Diagnosis not present

## 2016-07-05 DIAGNOSIS — D649 Anemia, unspecified: Secondary | ICD-10-CM | POA: Diagnosis not present

## 2016-07-05 DIAGNOSIS — E539 Vitamin B deficiency, unspecified: Secondary | ICD-10-CM | POA: Diagnosis not present

## 2016-07-05 DIAGNOSIS — Z85828 Personal history of other malignant neoplasm of skin: Secondary | ICD-10-CM | POA: Diagnosis not present

## 2016-07-05 DIAGNOSIS — Z8619 Personal history of other infectious and parasitic diseases: Secondary | ICD-10-CM | POA: Diagnosis not present

## 2016-07-05 DIAGNOSIS — C9141 Hairy cell leukemia, in remission: Secondary | ICD-10-CM

## 2016-07-05 DIAGNOSIS — C9140 Hairy cell leukemia not having achieved remission: Secondary | ICD-10-CM | POA: Diagnosis not present

## 2016-07-05 LAB — CBC WITH DIFFERENTIAL/PLATELET
Basophils Absolute: 0 10*3/uL (ref 0–0.1)
Basophils Relative: 0 %
Eosinophils Absolute: 0.1 10*3/uL (ref 0–0.7)
Eosinophils Relative: 4 %
HCT: 23.5 % — ABNORMAL LOW (ref 40.0–52.0)
Hemoglobin: 8.2 g/dL — ABNORMAL LOW (ref 13.0–18.0)
Lymphocytes Relative: 16 %
Lymphs Abs: 0.4 10*3/uL — ABNORMAL LOW (ref 1.0–3.6)
MCH: 38.7 pg — ABNORMAL HIGH (ref 26.0–34.0)
MCHC: 34.7 g/dL (ref 32.0–36.0)
MCV: 111.3 fL — ABNORMAL HIGH (ref 80.0–100.0)
Monocytes Absolute: 0.1 10*3/uL — ABNORMAL LOW (ref 0.2–1.0)
Monocytes Relative: 4 %
Neutro Abs: 2.1 10*3/uL (ref 1.4–6.5)
Neutrophils Relative %: 76 %
Platelets: 84 10*3/uL — ABNORMAL LOW (ref 150–440)
RBC: 2.11 MIL/uL — ABNORMAL LOW (ref 4.40–5.90)
RDW: 15.8 % — ABNORMAL HIGH (ref 11.5–14.5)
WBC: 2.7 10*3/uL — ABNORMAL LOW (ref 3.8–10.6)

## 2016-07-08 ENCOUNTER — Other Ambulatory Visit: Payer: Self-pay | Admitting: Family Medicine

## 2016-07-10 ENCOUNTER — Inpatient Hospital Stay: Payer: Medicare Other

## 2016-07-10 DIAGNOSIS — E785 Hyperlipidemia, unspecified: Secondary | ICD-10-CM | POA: Diagnosis not present

## 2016-07-10 DIAGNOSIS — Z7982 Long term (current) use of aspirin: Secondary | ICD-10-CM | POA: Diagnosis not present

## 2016-07-10 DIAGNOSIS — E1165 Type 2 diabetes mellitus with hyperglycemia: Secondary | ICD-10-CM | POA: Diagnosis not present

## 2016-07-10 DIAGNOSIS — C9000 Multiple myeloma not having achieved remission: Secondary | ICD-10-CM | POA: Diagnosis not present

## 2016-07-10 DIAGNOSIS — Z85828 Personal history of other malignant neoplasm of skin: Secondary | ICD-10-CM | POA: Diagnosis not present

## 2016-07-10 DIAGNOSIS — C9141 Hairy cell leukemia, in remission: Secondary | ICD-10-CM

## 2016-07-10 DIAGNOSIS — R011 Cardiac murmur, unspecified: Secondary | ICD-10-CM | POA: Diagnosis not present

## 2016-07-10 DIAGNOSIS — Z803 Family history of malignant neoplasm of breast: Secondary | ICD-10-CM | POA: Diagnosis not present

## 2016-07-10 DIAGNOSIS — Z86010 Personal history of colonic polyps: Secondary | ICD-10-CM | POA: Diagnosis not present

## 2016-07-10 DIAGNOSIS — Z8619 Personal history of other infectious and parasitic diseases: Secondary | ICD-10-CM | POA: Diagnosis not present

## 2016-07-10 DIAGNOSIS — C9140 Hairy cell leukemia not having achieved remission: Secondary | ICD-10-CM | POA: Diagnosis not present

## 2016-07-10 DIAGNOSIS — K5790 Diverticulosis of intestine, part unspecified, without perforation or abscess without bleeding: Secondary | ICD-10-CM | POA: Diagnosis not present

## 2016-07-10 DIAGNOSIS — D46Z Other myelodysplastic syndromes: Secondary | ICD-10-CM

## 2016-07-10 DIAGNOSIS — R5383 Other fatigue: Secondary | ICD-10-CM | POA: Diagnosis not present

## 2016-07-10 DIAGNOSIS — Z794 Long term (current) use of insulin: Secondary | ICD-10-CM | POA: Diagnosis not present

## 2016-07-10 DIAGNOSIS — K219 Gastro-esophageal reflux disease without esophagitis: Secondary | ICD-10-CM | POA: Diagnosis not present

## 2016-07-10 DIAGNOSIS — E539 Vitamin B deficiency, unspecified: Secondary | ICD-10-CM | POA: Diagnosis not present

## 2016-07-10 DIAGNOSIS — Z87891 Personal history of nicotine dependence: Secondary | ICD-10-CM | POA: Diagnosis not present

## 2016-07-10 DIAGNOSIS — N400 Enlarged prostate without lower urinary tract symptoms: Secondary | ICD-10-CM | POA: Diagnosis not present

## 2016-07-10 DIAGNOSIS — I10 Essential (primary) hypertension: Secondary | ICD-10-CM | POA: Diagnosis not present

## 2016-07-10 DIAGNOSIS — Z9221 Personal history of antineoplastic chemotherapy: Secondary | ICD-10-CM | POA: Diagnosis not present

## 2016-07-10 DIAGNOSIS — D649 Anemia, unspecified: Secondary | ICD-10-CM | POA: Diagnosis not present

## 2016-07-10 DIAGNOSIS — R0602 Shortness of breath: Secondary | ICD-10-CM | POA: Diagnosis not present

## 2016-07-10 LAB — CBC WITH DIFFERENTIAL/PLATELET
Basophils Absolute: 0 10*3/uL (ref 0–0.1)
Basophils Relative: 0 %
Eosinophils Absolute: 0.1 10*3/uL (ref 0–0.7)
Eosinophils Relative: 3 %
HCT: 24.1 % — ABNORMAL LOW (ref 40.0–52.0)
Hemoglobin: 8.4 g/dL — ABNORMAL LOW (ref 13.0–18.0)
Lymphocytes Relative: 13 %
Lymphs Abs: 0.6 10*3/uL — ABNORMAL LOW (ref 1.0–3.6)
MCH: 38.8 pg — ABNORMAL HIGH (ref 26.0–34.0)
MCHC: 35 g/dL (ref 32.0–36.0)
MCV: 110.8 fL — ABNORMAL HIGH (ref 80.0–100.0)
Monocytes Absolute: 0.3 10*3/uL (ref 0.2–1.0)
Monocytes Relative: 6 %
Neutro Abs: 3.7 10*3/uL (ref 1.4–6.5)
Neutrophils Relative %: 78 %
Platelets: 91 10*3/uL — ABNORMAL LOW (ref 150–440)
RBC: 2.17 MIL/uL — ABNORMAL LOW (ref 4.40–5.90)
RDW: 15 % — ABNORMAL HIGH (ref 11.5–14.5)
WBC: 4.7 10*3/uL (ref 3.8–10.6)

## 2016-07-19 ENCOUNTER — Inpatient Hospital Stay: Payer: Medicare Other

## 2016-07-19 ENCOUNTER — Other Ambulatory Visit: Payer: Self-pay | Admitting: *Deleted

## 2016-07-19 ENCOUNTER — Inpatient Hospital Stay: Payer: Medicare Other | Attending: Hematology and Oncology

## 2016-07-19 ENCOUNTER — Encounter: Payer: Self-pay | Admitting: Hematology and Oncology

## 2016-07-19 ENCOUNTER — Ambulatory Visit: Payer: Medicare Other | Admitting: Family Medicine

## 2016-07-19 ENCOUNTER — Inpatient Hospital Stay (HOSPITAL_BASED_OUTPATIENT_CLINIC_OR_DEPARTMENT_OTHER): Payer: Medicare Other | Admitting: Hematology and Oncology

## 2016-07-19 VITALS — BP 169/71 | HR 73 | Temp 97.8°F | Resp 18 | Wt 218.3 lb

## 2016-07-19 DIAGNOSIS — K219 Gastro-esophageal reflux disease without esophagitis: Secondary | ICD-10-CM

## 2016-07-19 DIAGNOSIS — Z87891 Personal history of nicotine dependence: Secondary | ICD-10-CM | POA: Insufficient documentation

## 2016-07-19 DIAGNOSIS — Z85828 Personal history of other malignant neoplasm of skin: Secondary | ICD-10-CM | POA: Diagnosis not present

## 2016-07-19 DIAGNOSIS — E119 Type 2 diabetes mellitus without complications: Secondary | ICD-10-CM | POA: Insufficient documentation

## 2016-07-19 DIAGNOSIS — C9000 Multiple myeloma not having achieved remission: Secondary | ICD-10-CM | POA: Insufficient documentation

## 2016-07-19 DIAGNOSIS — D649 Anemia, unspecified: Secondary | ICD-10-CM | POA: Diagnosis not present

## 2016-07-19 DIAGNOSIS — D701 Agranulocytosis secondary to cancer chemotherapy: Secondary | ICD-10-CM

## 2016-07-19 DIAGNOSIS — Z794 Long term (current) use of insulin: Secondary | ICD-10-CM

## 2016-07-19 DIAGNOSIS — D46Z Other myelodysplastic syndromes: Secondary | ICD-10-CM

## 2016-07-19 DIAGNOSIS — Z856 Personal history of leukemia: Secondary | ICD-10-CM | POA: Diagnosis not present

## 2016-07-19 DIAGNOSIS — E538 Deficiency of other specified B group vitamins: Secondary | ICD-10-CM | POA: Insufficient documentation

## 2016-07-19 DIAGNOSIS — C9141 Hairy cell leukemia, in remission: Secondary | ICD-10-CM

## 2016-07-19 DIAGNOSIS — Z803 Family history of malignant neoplasm of breast: Secondary | ICD-10-CM

## 2016-07-19 DIAGNOSIS — R0602 Shortness of breath: Secondary | ICD-10-CM | POA: Insufficient documentation

## 2016-07-19 DIAGNOSIS — I10 Essential (primary) hypertension: Secondary | ICD-10-CM | POA: Diagnosis not present

## 2016-07-19 DIAGNOSIS — N400 Enlarged prostate without lower urinary tract symptoms: Secondary | ICD-10-CM | POA: Insufficient documentation

## 2016-07-19 DIAGNOSIS — Z8701 Personal history of pneumonia (recurrent): Secondary | ICD-10-CM

## 2016-07-19 DIAGNOSIS — Z79899 Other long term (current) drug therapy: Secondary | ICD-10-CM | POA: Insufficient documentation

## 2016-07-19 DIAGNOSIS — R5383 Other fatigue: Secondary | ICD-10-CM | POA: Diagnosis not present

## 2016-07-19 DIAGNOSIS — K5900 Constipation, unspecified: Secondary | ICD-10-CM

## 2016-07-19 DIAGNOSIS — Z86010 Personal history of colonic polyps: Secondary | ICD-10-CM

## 2016-07-19 DIAGNOSIS — Z8619 Personal history of other infectious and parasitic diseases: Secondary | ICD-10-CM | POA: Insufficient documentation

## 2016-07-19 DIAGNOSIS — R011 Cardiac murmur, unspecified: Secondary | ICD-10-CM | POA: Diagnosis not present

## 2016-07-19 DIAGNOSIS — E785 Hyperlipidemia, unspecified: Secondary | ICD-10-CM | POA: Insufficient documentation

## 2016-07-19 DIAGNOSIS — C9001 Multiple myeloma in remission: Secondary | ICD-10-CM | POA: Diagnosis present

## 2016-07-19 DIAGNOSIS — Z7982 Long term (current) use of aspirin: Secondary | ICD-10-CM | POA: Insufficient documentation

## 2016-07-19 DIAGNOSIS — Z8669 Personal history of other diseases of the nervous system and sense organs: Secondary | ICD-10-CM

## 2016-07-19 DIAGNOSIS — D539 Nutritional anemia, unspecified: Secondary | ICD-10-CM

## 2016-07-19 DIAGNOSIS — T451X5A Adverse effect of antineoplastic and immunosuppressive drugs, initial encounter: Secondary | ICD-10-CM

## 2016-07-19 LAB — CBC WITH DIFFERENTIAL/PLATELET
Basophils Absolute: 0 10*3/uL (ref 0–0.1)
Basophils Relative: 1 %
Eosinophils Absolute: 0.1 10*3/uL (ref 0–0.7)
Eosinophils Relative: 10 %
HCT: 22 % — ABNORMAL LOW (ref 40.0–52.0)
Hemoglobin: 7.5 g/dL — ABNORMAL LOW (ref 13.0–18.0)
Lymphocytes Relative: 25 %
Lymphs Abs: 0.3 10*3/uL — ABNORMAL LOW (ref 1.0–3.6)
MCH: 37.8 pg — ABNORMAL HIGH (ref 26.0–34.0)
MCHC: 34.3 g/dL (ref 32.0–36.0)
MCV: 110.4 fL — ABNORMAL HIGH (ref 80.0–100.0)
Monocytes Absolute: 0.1 10*3/uL — ABNORMAL LOW (ref 0.2–1.0)
Monocytes Relative: 5 %
Neutro Abs: 0.7 10*3/uL — ABNORMAL LOW (ref 1.4–6.5)
Neutrophils Relative %: 59 %
Platelets: 71 10*3/uL — ABNORMAL LOW (ref 150–440)
RBC: 1.99 MIL/uL — ABNORMAL LOW (ref 4.40–5.90)
RDW: 14.9 % — ABNORMAL HIGH (ref 11.5–14.5)
WBC: 1.2 10*3/uL — CL (ref 3.8–10.6)

## 2016-07-19 LAB — COMPREHENSIVE METABOLIC PANEL
ALT: 36 U/L (ref 17–63)
AST: 22 U/L (ref 15–41)
Albumin: 3.2 g/dL — ABNORMAL LOW (ref 3.5–5.0)
Alkaline Phosphatase: 57 U/L (ref 38–126)
Anion gap: 6 (ref 5–15)
BUN: 31 mg/dL — ABNORMAL HIGH (ref 6–20)
CO2: 23 mmol/L (ref 22–32)
Calcium: 7.9 mg/dL — ABNORMAL LOW (ref 8.9–10.3)
Chloride: 103 mmol/L (ref 101–111)
Creatinine, Ser: 1.43 mg/dL — ABNORMAL HIGH (ref 0.61–1.24)
GFR calc Af Amer: 52 mL/min — ABNORMAL LOW (ref >60)
GFR calc non Af Amer: 45 mL/min — ABNORMAL LOW (ref >60)
Glucose, Bld: 284 mg/dL — ABNORMAL HIGH (ref 65–99)
Potassium: 3.8 mmol/L (ref 3.5–5.1)
Sodium: 132 mmol/L — ABNORMAL LOW (ref 135–145)
Total Bilirubin: 0.6 mg/dL (ref 0.3–1.2)
Total Protein: 6.3 g/dL — ABNORMAL LOW (ref 6.5–8.1)

## 2016-07-19 LAB — SAMPLE TO BLOOD BANK

## 2016-07-19 MED ORDER — FILGRASTIM 480 MCG/0.8ML IJ SOSY
480.0000 ug | PREFILLED_SYRINGE | Freq: Once | INTRAMUSCULAR | Status: DC
  Filled 2016-07-19: qty 0.8

## 2016-07-19 MED ORDER — TBO-FILGRASTIM 480 MCG/0.8ML ~~LOC~~ SOSY
480.0000 ug | PREFILLED_SYRINGE | Freq: Once | SUBCUTANEOUS | Status: AC
  Administered 2016-07-19: 480 ug via SUBCUTANEOUS

## 2016-07-19 NOTE — Progress Notes (Signed)
[facility] day:  07/19/2016   Chief Complaint: Alex Smith is a 81 y.o. male with a myelodysplastic marrow, smoldering myeloma, and a history of hairy cell leukemia who is seen for assessment on day 20 of cycle #1 Revlimid.  HPI:  The patient was last seen in the medical oncology clinic on 06/28/2016.  At that time, he noted chronic fatigue.  Exam was stable.  Hematocrit was 26.2 with a hemoglobin of 9.0.  We discussed intiation of Revlimid and Decadron.  He was to start on 06/29/2016.  CBC on 07/05/2016 included a hematocrit 23.5, hemoglobin 8.2, MCV 111.3, platelets 84,000, white count 2700 with an ANC of 2100. CBC on 07/10/2016 included a hematocrit 24.1, hemoglobin 8.4, MCV 110.8, platelets 71,000, white count 4700 with an ANC of 3700.  Symptomatically, he notes some shortness of breath and constipation since starting Revlimid.  Today is day 20.  Blood sugar has been up and down.  He denies any fevers, sweats or weight loss.  He denies any bone pain.     Past Medical History:  Diagnosis Date  . Anemia   . B12 deficiency   . Basal cell carcinoma of face   . BPH (benign prostatic hypertrophy)    followed by urology, discharged (Dr. Bernardo Smith)  . CAP (community acquired pneumonia) 02/15/2015  . Colon polyps   . Diverticulosis   . GERD (gastroesophageal reflux disease)   . Hairy cell leukemia ([place]) 2006   recurrent, seizure on rituxan, now on cladribine ()  . History of pneumonia 2000's   "once" (07/07/2012)  . History of shingles   . HLD (hyperlipidemia)   . Hypertension   . Pneumonia   . Shortness of breath dyspnea   . Systolic murmur 47/65/4650  . Type 2 diabetes, controlled, with retinopathy (Alex Smith)     Past Surgical History:  Procedure Laterality Date  . [place] VITRECTOMY WITH 20 GAUGE MVR PORT FOR MACULAR HOLE  07/07/2012   Procedure: 25 GAUGE PARS PLANA VITRECTOMY WITH 20 GAUGE MVR PORT FOR MACULAR HOLE;   Surgeon: Alex Pedro, MD;  Location: [facility];  Service: Ophthalmology;  Laterality: Left;  . BONE MARROW BIOPSY  2016  . CARDIOVASCULAR STRESS TEST  2013   treadmill - no evidence ischemia, EF 61%  . CATARACT EXTRACTION W/ INTRAOCULAR LENS  IMPLANT, BILATERAL  ~ 2010  . COLONOSCOPY  2014   [place] WNL no rpt needed, h/o polyps  . EYE SURGERY Left 06/2012   laser surgery  . GAS INSERTION  07/07/2012   Procedure: INSERTION OF GAS;  Surgeon: Alex Pedro, MD;  Location: [place];  Service: Ophthalmology;  Laterality: Left;  [id number]  . PERIPHERAL VASCULAR CATHETERIZATION N/A 02/23/2015   Procedure: Alex Smith Cath Insertion;  Surgeon: Alex Huxley, MD;  Location: [facility];  Service: Cardiovascular;  Laterality: N/A;  . SERUM PATCH  07/07/2012   Procedure: SERUM PATCH;  Surgeon: Alex Pedro, MD;  Location: [facility];  Service: Ophthalmology;  Laterality: Left;  . SKIN CANCER EXCISION     "all over my face" (07/07/2012)    Family History  Problem Relation Age of Onset  . Dementia Mother   . Heart failure Father 59  . Cancer Sister     breast  . Diabetes Paternal Uncle   . Diabetes Paternal Aunt   . CAD Brother 70    MI  . Stroke Neg Hx  Social History:  reports that he quit smoking about 47 years ago. His smoking use included Cigarettes. He has a 50.00 pack-year smoking history. He has never used smokeless tobacco. He reports that he drinks alcohol. He reports that he does not use drugs.  He works at [employer] 20-32 hours/week.  The patient 's wife has had 3 hip operations.  She underwent hip replacement 03/06/2016.  She recently hurt her shoulder.  He takes care of his wife.  He states that they either eat out or bring food in.  His best contact number is his cell: [phone].  He is accompanied by his youngest son, Alex Smith, today.  Allergies:  Allergies  Allergen Reactions  . Rituximab Rash    Chest tightness  . Blood-Group Specific Substance Other (See Comments)    Had a  post transfusion reaction of red blood cells; NOW REQUIRES WASHED BLOOD CELLS  . Primaxin [Imipenem] Other (See Comments)    Possible allergy  . Sulfa Antibiotics Itching and Rash  Possible allergy to Zoloft, allopurinol, and a chemotherapy medication.  He tolerates Dapsone.  Current Medications: Current Outpatient Prescriptions  Medication Sig Dispense Refill  . acyclovir (ZOVIRAX) 400 MG tablet TAKE TWO TABLETS TWICE DAILY 120 tablet 3  . aspirin EC 81 MG tablet Take 81 mg by mouth daily.    Marland Kitchen dexamethasone (DECADRON) 4 MG tablet Take 5 tablets (20 mg total) by mouth once a week. 20 tablet 1  . glucose blood (ACCU-CHEK AVIVA PLUS) test strip Use to check sugar twice daily Dx:E11.9 200 each 3  . HUMULIN R 100 UNIT/ML injection INJCET 10 UNITS SUBCUTANEOUSLY TWICE DAILY BEFORE MEALS 10 mL 11  . hydrochlorothiazide (HYDRODIURIL) 25 MG tablet TAKE ONE TABLET BY MOUTH EVERY DAY 30 tablet 3  . insulin NPH Human (HUMULIN N) 100 UNIT/ML injection Inject 0.39 mLs (39 Units total) into the skin 2 (two) times daily before a meal.    . lenalidomide (REVLIMID) 10 MG capsule 10 mg a day for 21 days then 7 days off 21 capsule 2  . lidocaine-prilocaine (EMLA) cream Apply 1 application topically as needed. 30 g 3  . lisinopril (PRINIVIL,ZESTRIL) 20 MG tablet TAKE ONE TABLET TWICE DAILY 60 tablet 6  . metFORMIN (GLUCOPHAGE) 1000 MG tablet TAKE ONE TABLET BY MOUTH TWICE DAILY AS DIRECTED 180 tablet 3  . MICROLET LANCETS MISC USE AS DIRECTED 200 each 3  . pravastatin (PRAVACHOL) 20 MG tablet Take 1 tablet (20 mg total) by mouth at bedtime. 90 tablet 2  . pravastatin (PRAVACHOL) 20 MG tablet TAKE ONE TABLET AT BEDTIME 30 tablet 3  . ranitidine (ZANTAC) 150 MG tablet Take 1 tablet (150 mg total) by mouth 2 (two) times daily as needed. 180 tablet 3  . tamsulosin (FLOMAX) 0.4 MG CAPS capsule Take 0.4 mg by mouth daily.    . temazepam (RESTORIL) 7.5 MG capsule Take 7.5 mg by mouth once.     . traZODone (DESYREL)  50 MG tablet Take 0.5-1 tablets (25-50 mg total) by mouth at bedtime as needed for sleep. 30 tablet 3  . vitamin B-12 (CYANOCOBALAMIN) 1000 MCG tablet Take 1,000 mcg by mouth daily.    . vitamin E 400 UNIT capsule Take 200 Units by mouth daily.      No current facility-administered medications for this visit.    Facility-Administered Medications Ordered in Other Visits  Medication Dose Route Frequency Provider Last Rate Last Dose  . heparin lock flush 100 unit/mL  500 Units Intravenous Once Patrice Matthew  Elmarie Mainland, MD      . sodium chloride 0.9 % injection 10 mL  10 mL Intravenous PRN Lequita Asal, MD   10 mL at 03/03/15 0903  . sodium chloride 0.9 % injection 10 mL  10 mL Intracatheter PRN Lequita Asal, MD   10 mL at 03/10/15 1410   Review of Systems:  GENERAL:  Chronic fatigue.  No fevers or sweats.  Weight down 2 pounds. PERFORMANCE STATUS (ECOG):  1 HEENT: No visual changes, runny nose, sore throat. Pulmonary:  Shortness of breath on exertion.  No cough.  No hemoptysis. Cardiac:  No chest pain, palpitations, orthopnea, or PND. GI:  Constipation on Revlimid.  No nausea, vomiting, diarrhea, melena or hematochezia.  Drinks Boost. GU:  No urgency, frequency, dysuria, or hematuria. Musculoskeletal:  No back pain.  No joint pain.  No muscle tenderness. Extremities:  No pain or swelling. Skin:  No rashes or skin changes. Neuro:  No headache, numbness or weakness, balance or coordination issues. Endocrine:  Diabetes.  Blood sugar up and down.  No thyroid issues, hot flashes or night sweats. Psych:  No mood changes, depression or anxiety.  Pain:  No focal pain. Review of systems:  All other systems reviewed and found to be negative.   Physical Exam: Blood pressure (!) 169/71, pulse 73, temperature 97.8 F (36.6 C), temperature source Tympanic, resp. rate 18, weight 218 lb 4.1 oz (99 kg), SpO2 97 %.  GENERAL:  Well developed, well nourished, gentleman sitting comfortably in the exam  room in no acute distress. MENTAL STATUS:  Alert and oriented to person, place and time. HEAD:  Pearline Cables white hair.  Normocephalic, atraumatic, face symmetric, no Cushingoid features. EYES:  Glasses. Blue eyes.  Pupils equal round and reactive to light and accomodation.  No conjunctivitis or scleral icterus. ENT:  Oropharynx clear without lesion.  Tongue normal. Mucous membranes moist.  RESPIRATORY:  Clear to auscultation without rales, wheezes or rhonchi. CARDIOVASCULAR:  Regular rate and rhythm without murmur, rub or gallop. ABDOMEN:  Soft, non-tender, with active bowel sounds, and no hepatosplenomegaly.  No masses. SKIN:  No rashes or ulcers. EXTREMITIES: No edema, no skin discoloration or tenderness.  No palpable cords. LYMPH NODES: No palpable cervical, supraclavicular, axillary or inguinal adenopathy  NEUROLOGICAL: Unremarkable. PSYCH:  Appropriate.    Appointment on 07/19/2016  Component Date Value Ref Range Status  . WBC 07/19/2016 1.2* 3.8 - 10.6 K/uL Final   Comment: RESULT REPEATED AND VERIFIED CANCER CENTER CRITICAL VALUE PROTOCOL   . RBC 07/19/2016 1.99* 4.40 - 5.90 MIL/uL Final  . Hemoglobin 07/19/2016 7.5* 13.0 - 18.0 g/dL Final  . HCT 07/19/2016 22.0* 40.0 - 52.0 % Final  . MCV 07/19/2016 110.4* 80.0 - 100.0 fL Final  . MCH 07/19/2016 37.8* 26.0 - 34.0 pg Final  . MCHC 07/19/2016 34.3  32.0 - 36.0 g/dL Final  . RDW 07/19/2016 14.9* 11.5 - 14.5 % Final  . Platelets 07/19/2016 71* 150 - 440 K/uL Final  . Neutrophils Relative % 07/19/2016 59  % Final  . Neutro Abs 07/19/2016 0.7* 1.4 - 6.5 K/uL Final  . Lymphocytes Relative 07/19/2016 25  % Final  . Lymphs Abs 07/19/2016 0.3* 1.0 - 3.6 K/uL Final  . Monocytes Relative 07/19/2016 5  % Final  . Monocytes Absolute 07/19/2016 0.1* 0.2 - 1.0 K/uL Final  . Eosinophils Relative 07/19/2016 10  % Final  . Eosinophils Absolute 07/19/2016 0.1  0 - 0.7 K/uL Final  . Basophils Relative 07/19/2016 1  %  Final  . Basophils Absolute  07/19/2016 0.0  0 - 0.1 K/uL Final  . Sodium 07/19/2016 132* 135 - 145 mmol/L Final  . Potassium 07/19/2016 3.8  3.5 - 5.1 mmol/L Final  . Chloride 07/19/2016 103  101 - 111 mmol/L Final  . CO2 07/19/2016 23  22 - 32 mmol/L Final  . Glucose, Bld 07/19/2016 284* 65 - 99 mg/dL Final  . BUN 07/19/2016 31* 6 - 20 mg/dL Final  . Creatinine, Ser 07/19/2016 1.43* 0.61 - 1.24 mg/dL Final  . Calcium 07/19/2016 7.9* 8.9 - 10.3 mg/dL Final  . Total Protein 07/19/2016 6.3* 6.5 - 8.1 g/dL Final  . Albumin 07/19/2016 3.2* 3.5 - 5.0 g/dL Final  . AST 07/19/2016 22  15 - 41 U/L Final  . ALT 07/19/2016 36  17 - 63 U/L Final  . Alkaline Phosphatase 07/19/2016 57  38 - 126 U/L Final  . Total Bilirubin 07/19/2016 0.6  0.3 - 1.2 mg/dL Final  . GFR calc non Af Amer 07/19/2016 45* >60 mL/min Final  . GFR calc Af Amer 07/19/2016 52* >60 mL/min Final   Comment: (NOTE) The eGFR has been calculated using the CKD EPI equation. This calculation has not been validated in all clinical situations. eGFR's persistently <60 mL/min signify possible Chronic Kidney Disease.   Georgiann Hahn gap 07/19/2016 6  5 - 15 Final    Assessment:  Alex Smith is a 81 y.o. 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  He was diagnosed with hairy cell leukemia in 2006.  He received cladribine 11/06-11/05/2005.  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   Bone marrow aspirate and biopsy on 02/07/2015 revealed a extensive marrow involvement by recurrent hairy cell leukemia (approximately 80% of cells in the core). There was a monoclonal plasma cell infiltrate (approximately 10%), compatible with a plasma cell neoplasm. Marrow was hypercellular (40-50%) with markedly diminished residual trilineage hematopoiesis and mild multilineage dyspoiesis.  Peripheral smear revealed leukopenia (WBC 1900) with 2% atypical lymphoid cells compatible with hairy cell leukemia. There was moderate anemia with  anisopoikilocytosis. 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  FISH studies for MDS were negative.  Cytogenetics were normal (46, XY).  He has an unclear allergy history.  He may be allergic to Primaxin, Zoloft, allopurinol, and a chemotherapy medication.  Reaction occurred in 06/2005.  He is allergic to sulfa.  He is not allergic to dapsone.  He requires washed RBCs.  He is on prophylactic acyclovir.  Dapsone and voriconazole were discontinued.  He has a history of shingles prior to prophylactic acyclovir.  He is 17 months status post cladribine (began 03/03/2015).  His counts plateaued on 07/25/2015 and have subsequently decreased.  Labs on 07/25/2015 revealed a normal B12 (894), folate (60.5), and TSH (2.562).  Ferritin was 52.  Bone marrow on 11/03/2015 revealed persistent plasma cell neoplasm, now with mildy atypical monoclonal plasma cells estimated at 20-30%.   There was no morphologic evidence of residual hairy cell leukemia.  There was a minute population (0.03%) with hairy cell immunophenotype detected by flow.  Marrow was normocellular for age with relative erythroid hyperplasia, relative myeloid hypoplasia, mild dyspoiesis, adequate megakaryocytes and no increased blasts.  There was no significant increase in marrow reticulin fibers.  Iron was present.  FISH studies for MDS were negative.  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  Cytogenetics were normal (46, XY).  Bone marrow on 03/13/2016 was normocellular to marginally hypercellular for age (20-40%) with relative erythroid hyperplasia and mild dyserythropoiesis, decreased granulopoiesis and adequate megakaryocytes.  There was a persistent 15-20% plasma cell neoplasm.  There was no overt increase in reticulin fibrosis.  Storage iron was present.  Flow cytometry revealed abnormal/monocytic plasma cell population (3% sample),  consistent with a plasma cell neoplastic process.  Cytogenetics were normal (46, XY).  FISH studies for myeloma revealed a CCND1/IGH translocation, t(11;14) in 53% of nuclei.  SPEP on 11/17/2015 revealed a 1.3 gm/dL monoclonal spike (1.4 gm/dL on 07/25/2015 and 1.0 on 02/03/2015).  Kappa free light chains were 50.51, lambda free light chains were 10.48 with a ratio of 4.82 (ratio 3.68 on 07/25/2015).  Beta2-microglobulin was 2.5.  24 hour urine on 11/21/2015 revealed 234.8 mg protein in 24 hours (no monoclonal protein).  Bone survey on 11/24/2015 revealed no lytic lesions.  Epo level was 329.6 on 03/29/2016.  He began a trial of Procrit.  He received 10,000 units of Procrit weekly (04/26/2016 - 06/14/2016).  Hemoglobin ranged between 8.4 and 8.9 without trend.  He is currently day 20 of cycle #1 Revlimid and Decadron (began on 06/29/2016).  He is on aspirin 81 mg a day prophylaxis.  PET scan on 03/14/2016 revealed no evidence of hypermetabolic lymphadenopathy or focal skeletal lesions to suggest marrow or cortical bone involvement.  Symptomatically, he has chronic fatigue.  He notes increased shortness of breath and constipation on Revlimid.  Exam is stable.  Hematocrit is 22.2 with a hemoglobin of 7.5.  ANC is 700.  Plan: 1.  Labs today:  CBC with diff, CMP, hold tube. 2.  Hold Revlimid. 3.  Neutropenic precautions. 4.  Discuss use of GCSF (480 mcg SQ  q day prn). 5.  GCSF 480 mcg SQ today. 6.  RTC on 07/22/2016 for labs (CBC, type and screen) +/- GCSF. 7.  RTC on 07/23/2016 for transfusion 2 units of washed PRBCs. 8.  RTC in 1 week for labs (CBC with diff). 9.  RTC in 2 weeks for MD assessment and labs (CBC with diff, type and hold).   Lequita Asal, MD  07/19/2016, 2:16 PM

## 2016-07-19 NOTE — Progress Notes (Signed)
Patient has noticed SOBr on exertion and constipation since starting the Revlimid.

## 2016-07-22 ENCOUNTER — Ambulatory Visit: Payer: Medicare Other

## 2016-07-22 ENCOUNTER — Other Ambulatory Visit: Payer: Self-pay | Admitting: Hematology and Oncology

## 2016-07-22 ENCOUNTER — Inpatient Hospital Stay: Payer: Medicare Other

## 2016-07-22 ENCOUNTER — Inpatient Hospital Stay: Payer: Medicare Other | Admitting: *Deleted

## 2016-07-22 VITALS — BP 120/63 | HR 73 | Temp 98.0°F | Resp 20

## 2016-07-22 DIAGNOSIS — K5900 Constipation, unspecified: Secondary | ICD-10-CM | POA: Diagnosis not present

## 2016-07-22 DIAGNOSIS — C9000 Multiple myeloma not having achieved remission: Secondary | ICD-10-CM | POA: Diagnosis not present

## 2016-07-22 DIAGNOSIS — D46Z Other myelodysplastic syndromes: Secondary | ICD-10-CM

## 2016-07-22 DIAGNOSIS — Z8619 Personal history of other infectious and parasitic diseases: Secondary | ICD-10-CM | POA: Diagnosis not present

## 2016-07-22 DIAGNOSIS — E538 Deficiency of other specified B group vitamins: Secondary | ICD-10-CM | POA: Diagnosis not present

## 2016-07-22 DIAGNOSIS — R5383 Other fatigue: Secondary | ICD-10-CM | POA: Diagnosis not present

## 2016-07-22 DIAGNOSIS — N400 Enlarged prostate without lower urinary tract symptoms: Secondary | ICD-10-CM | POA: Diagnosis not present

## 2016-07-22 DIAGNOSIS — K219 Gastro-esophageal reflux disease without esophagitis: Secondary | ICD-10-CM | POA: Diagnosis not present

## 2016-07-22 DIAGNOSIS — Z8669 Personal history of other diseases of the nervous system and sense organs: Secondary | ICD-10-CM | POA: Diagnosis not present

## 2016-07-22 DIAGNOSIS — Z86010 Personal history of colonic polyps: Secondary | ICD-10-CM | POA: Diagnosis not present

## 2016-07-22 DIAGNOSIS — Z85828 Personal history of other malignant neoplasm of skin: Secondary | ICD-10-CM | POA: Diagnosis not present

## 2016-07-22 DIAGNOSIS — R011 Cardiac murmur, unspecified: Secondary | ICD-10-CM | POA: Diagnosis not present

## 2016-07-22 DIAGNOSIS — Z79899 Other long term (current) drug therapy: Secondary | ICD-10-CM | POA: Diagnosis not present

## 2016-07-22 DIAGNOSIS — I10 Essential (primary) hypertension: Secondary | ICD-10-CM | POA: Diagnosis not present

## 2016-07-22 DIAGNOSIS — Z794 Long term (current) use of insulin: Secondary | ICD-10-CM | POA: Diagnosis not present

## 2016-07-22 DIAGNOSIS — Z803 Family history of malignant neoplasm of breast: Secondary | ICD-10-CM | POA: Diagnosis not present

## 2016-07-22 DIAGNOSIS — D649 Anemia, unspecified: Secondary | ICD-10-CM | POA: Diagnosis not present

## 2016-07-22 DIAGNOSIS — Z87891 Personal history of nicotine dependence: Secondary | ICD-10-CM | POA: Diagnosis not present

## 2016-07-22 DIAGNOSIS — Z856 Personal history of leukemia: Secondary | ICD-10-CM | POA: Diagnosis not present

## 2016-07-22 DIAGNOSIS — E785 Hyperlipidemia, unspecified: Secondary | ICD-10-CM | POA: Diagnosis not present

## 2016-07-22 DIAGNOSIS — R0602 Shortness of breath: Secondary | ICD-10-CM | POA: Diagnosis not present

## 2016-07-22 DIAGNOSIS — E119 Type 2 diabetes mellitus without complications: Secondary | ICD-10-CM | POA: Diagnosis not present

## 2016-07-22 DIAGNOSIS — Z7982 Long term (current) use of aspirin: Secondary | ICD-10-CM | POA: Diagnosis not present

## 2016-07-22 LAB — CBC WITH DIFFERENTIAL/PLATELET
Basophils Absolute: 0 10*3/uL (ref 0–0.1)
Basophils Relative: 2 %
Eosinophils Absolute: 0.1 10*3/uL (ref 0–0.7)
Eosinophils Relative: 9 %
HCT: 21.9 % — ABNORMAL LOW (ref 40.0–52.0)
Hemoglobin: 7.5 g/dL — ABNORMAL LOW (ref 13.0–18.0)
Lymphocytes Relative: 26 %
Lymphs Abs: 0.4 10*3/uL — ABNORMAL LOW (ref 1.0–3.6)
MCH: 37.8 pg — ABNORMAL HIGH (ref 26.0–34.0)
MCHC: 34.1 g/dL (ref 32.0–36.0)
MCV: 110.9 fL — ABNORMAL HIGH (ref 80.0–100.0)
Monocytes Absolute: 0.1 10*3/uL — ABNORMAL LOW (ref 0.2–1.0)
Monocytes Relative: 5 %
Neutro Abs: 0.9 10*3/uL — ABNORMAL LOW (ref 1.4–6.5)
Neutrophils Relative %: 58 %
Platelets: 65 10*3/uL — ABNORMAL LOW (ref 150–440)
RBC: 1.98 MIL/uL — ABNORMAL LOW (ref 4.40–5.90)
RDW: 15.1 % — ABNORMAL HIGH (ref 11.5–14.5)
WBC: 1.5 10*3/uL — ABNORMAL LOW (ref 3.8–10.6)

## 2016-07-22 LAB — PREPARE RBC (CROSSMATCH)

## 2016-07-22 MED ORDER — TBO-FILGRASTIM 480 MCG/0.8ML ~~LOC~~ SOSY
480.0000 ug | PREFILLED_SYRINGE | Freq: Once | SUBCUTANEOUS | Status: DC
  Filled 2016-07-22: qty 0.8

## 2016-07-22 MED ORDER — FILGRASTIM 480 MCG/0.8ML IJ SOSY: 480.0000 ug | PREFILLED_SYRINGE | Freq: Once | INTRAMUSCULAR | Status: DC

## 2016-07-22 MED ORDER — SODIUM CHLORIDE 0.9 % IV SOLN
250.0000 mL | Freq: Once | INTRAVENOUS | Status: AC
  Administered 2016-07-22: 250 mL via INTRAVENOUS
  Filled 2016-07-22: qty 250

## 2016-07-22 MED ORDER — TBO-FILGRASTIM 480 MCG/0.8ML ~~LOC~~ SOSY
480.0000 ug | PREFILLED_SYRINGE | Freq: Once | SUBCUTANEOUS | Status: AC
  Administered 2016-07-22: 480 ug via SUBCUTANEOUS

## 2016-07-22 MED ORDER — ACETAMINOPHEN 325 MG PO TABS
650.0000 mg | ORAL_TABLET | Freq: Once | ORAL | Status: AC
  Administered 2016-07-22: 650 mg via ORAL
  Filled 2016-07-22: qty 2

## 2016-07-22 MED ORDER — HEPARIN SOD (PORK) LOCK FLUSH 100 UNIT/ML IV SOLN
INTRAVENOUS | Status: AC
  Filled 2016-07-22: qty 5

## 2016-07-22 MED ORDER — HEPARIN SOD (PORK) LOCK FLUSH 100 UNIT/ML IV SOLN
500.0000 [IU] | Freq: Once | INTRAVENOUS | Status: AC
  Administered 2016-07-22: 500 [IU] via INTRAVENOUS

## 2016-07-22 MED ORDER — DIPHENHYDRAMINE HCL 25 MG PO CAPS
25.0000 mg | ORAL_CAPSULE | Freq: Once | ORAL | Status: AC
  Administered 2016-07-22: 25 mg via ORAL
  Filled 2016-07-22: qty 1

## 2016-07-22 MED ORDER — TBO-FILGRASTIM 480 MCG/0.8ML ~~LOC~~ SOSY: 480.0000 ug | PREFILLED_SYRINGE | Freq: Once | SUBCUTANEOUS | Status: AC

## 2016-07-23 ENCOUNTER — Telehealth: Payer: Self-pay | Admitting: *Deleted

## 2016-07-23 ENCOUNTER — Inpatient Hospital Stay: Payer: Medicare Other

## 2016-07-23 ENCOUNTER — Ambulatory Visit (INDEPENDENT_AMBULATORY_CARE_PROVIDER_SITE_OTHER): Payer: Self-pay | Admitting: Ophthalmology

## 2016-07-23 ENCOUNTER — Other Ambulatory Visit: Payer: Self-pay | Admitting: Family Medicine

## 2016-07-23 DIAGNOSIS — Z79899 Other long term (current) drug therapy: Secondary | ICD-10-CM | POA: Diagnosis not present

## 2016-07-23 DIAGNOSIS — Z86010 Personal history of colonic polyps: Secondary | ICD-10-CM | POA: Diagnosis not present

## 2016-07-23 DIAGNOSIS — Z803 Family history of malignant neoplasm of breast: Secondary | ICD-10-CM | POA: Diagnosis not present

## 2016-07-23 DIAGNOSIS — N400 Enlarged prostate without lower urinary tract symptoms: Secondary | ICD-10-CM | POA: Diagnosis not present

## 2016-07-23 DIAGNOSIS — Z794 Long term (current) use of insulin: Secondary | ICD-10-CM | POA: Diagnosis not present

## 2016-07-23 DIAGNOSIS — D46Z Other myelodysplastic syndromes: Secondary | ICD-10-CM

## 2016-07-23 DIAGNOSIS — Z856 Personal history of leukemia: Secondary | ICD-10-CM | POA: Diagnosis not present

## 2016-07-23 DIAGNOSIS — R5383 Other fatigue: Secondary | ICD-10-CM | POA: Diagnosis not present

## 2016-07-23 DIAGNOSIS — I10 Essential (primary) hypertension: Secondary | ICD-10-CM | POA: Diagnosis not present

## 2016-07-23 DIAGNOSIS — E785 Hyperlipidemia, unspecified: Secondary | ICD-10-CM | POA: Diagnosis not present

## 2016-07-23 DIAGNOSIS — Z87891 Personal history of nicotine dependence: Secondary | ICD-10-CM | POA: Diagnosis not present

## 2016-07-23 DIAGNOSIS — D649 Anemia, unspecified: Secondary | ICD-10-CM | POA: Diagnosis not present

## 2016-07-23 DIAGNOSIS — Z8619 Personal history of other infectious and parasitic diseases: Secondary | ICD-10-CM | POA: Diagnosis not present

## 2016-07-23 DIAGNOSIS — E538 Deficiency of other specified B group vitamins: Secondary | ICD-10-CM | POA: Diagnosis not present

## 2016-07-23 DIAGNOSIS — Z7982 Long term (current) use of aspirin: Secondary | ICD-10-CM | POA: Diagnosis not present

## 2016-07-23 DIAGNOSIS — E119 Type 2 diabetes mellitus without complications: Secondary | ICD-10-CM | POA: Diagnosis not present

## 2016-07-23 DIAGNOSIS — K219 Gastro-esophageal reflux disease without esophagitis: Secondary | ICD-10-CM | POA: Diagnosis not present

## 2016-07-23 DIAGNOSIS — K5900 Constipation, unspecified: Secondary | ICD-10-CM | POA: Diagnosis not present

## 2016-07-23 DIAGNOSIS — Z85828 Personal history of other malignant neoplasm of skin: Secondary | ICD-10-CM | POA: Diagnosis not present

## 2016-07-23 DIAGNOSIS — Z8669 Personal history of other diseases of the nervous system and sense organs: Secondary | ICD-10-CM | POA: Diagnosis not present

## 2016-07-23 DIAGNOSIS — R0602 Shortness of breath: Secondary | ICD-10-CM | POA: Diagnosis not present

## 2016-07-23 DIAGNOSIS — R011 Cardiac murmur, unspecified: Secondary | ICD-10-CM | POA: Diagnosis not present

## 2016-07-23 DIAGNOSIS — C9000 Multiple myeloma not having achieved remission: Secondary | ICD-10-CM | POA: Diagnosis not present

## 2016-07-23 MED ORDER — FILGRASTIM 480 MCG/0.8ML IJ SOSY: 480.0000 ug | PREFILLED_SYRINGE | Freq: Once | INTRAMUSCULAR | Status: DC

## 2016-07-23 MED ORDER — TBO-FILGRASTIM 480 MCG/0.8ML ~~LOC~~ SOSY
480.0000 ug | PREFILLED_SYRINGE | Freq: Once | SUBCUTANEOUS | Status: AC
  Administered 2016-07-23: 480 ug via SUBCUTANEOUS

## 2016-07-23 MED ORDER — FILGRASTIM 480 MCG/0.8ML IJ SOSY
480.0000 ug | PREFILLED_SYRINGE | Freq: Once | INTRAMUSCULAR | Status: DC
  Filled 2016-07-23: qty 0.8

## 2016-07-23 NOTE — Telephone Encounter (Signed)
Called pt to let him know that he does not have to come 12/8 to the cancer center. And I did make sure he is off the revlimid and he says he is off of it since he saw her last week. He has one pill left.

## 2016-07-23 NOTE — Progress Notes (Signed)
Patient on schedule for "possible injection".  Confirmed with Dr. Kem Parkinson nurse Judeen Hammans that the injection is Neupogen and patient is no longer receiving Procrit injections while taking oral Revlimid.

## 2016-07-24 ENCOUNTER — Inpatient Hospital Stay: Payer: Medicare Other

## 2016-07-24 DIAGNOSIS — D46Z Other myelodysplastic syndromes: Secondary | ICD-10-CM

## 2016-07-24 DIAGNOSIS — Z8619 Personal history of other infectious and parasitic diseases: Secondary | ICD-10-CM | POA: Diagnosis not present

## 2016-07-24 DIAGNOSIS — Z7982 Long term (current) use of aspirin: Secondary | ICD-10-CM | POA: Diagnosis not present

## 2016-07-24 DIAGNOSIS — R5383 Other fatigue: Secondary | ICD-10-CM | POA: Diagnosis not present

## 2016-07-24 DIAGNOSIS — Z87891 Personal history of nicotine dependence: Secondary | ICD-10-CM | POA: Diagnosis not present

## 2016-07-24 DIAGNOSIS — N400 Enlarged prostate without lower urinary tract symptoms: Secondary | ICD-10-CM | POA: Diagnosis not present

## 2016-07-24 DIAGNOSIS — R0602 Shortness of breath: Secondary | ICD-10-CM | POA: Diagnosis not present

## 2016-07-24 DIAGNOSIS — Z8669 Personal history of other diseases of the nervous system and sense organs: Secondary | ICD-10-CM | POA: Diagnosis not present

## 2016-07-24 DIAGNOSIS — K5900 Constipation, unspecified: Secondary | ICD-10-CM | POA: Diagnosis not present

## 2016-07-24 DIAGNOSIS — Z85828 Personal history of other malignant neoplasm of skin: Secondary | ICD-10-CM | POA: Diagnosis not present

## 2016-07-24 DIAGNOSIS — Z79899 Other long term (current) drug therapy: Secondary | ICD-10-CM | POA: Diagnosis not present

## 2016-07-24 DIAGNOSIS — K219 Gastro-esophageal reflux disease without esophagitis: Secondary | ICD-10-CM | POA: Diagnosis not present

## 2016-07-24 DIAGNOSIS — E538 Deficiency of other specified B group vitamins: Secondary | ICD-10-CM | POA: Diagnosis not present

## 2016-07-24 DIAGNOSIS — Z86010 Personal history of colonic polyps: Secondary | ICD-10-CM | POA: Diagnosis not present

## 2016-07-24 DIAGNOSIS — Z803 Family history of malignant neoplasm of breast: Secondary | ICD-10-CM | POA: Diagnosis not present

## 2016-07-24 DIAGNOSIS — Z856 Personal history of leukemia: Secondary | ICD-10-CM | POA: Diagnosis not present

## 2016-07-24 DIAGNOSIS — R011 Cardiac murmur, unspecified: Secondary | ICD-10-CM | POA: Diagnosis not present

## 2016-07-24 DIAGNOSIS — E119 Type 2 diabetes mellitus without complications: Secondary | ICD-10-CM | POA: Diagnosis not present

## 2016-07-24 DIAGNOSIS — C9000 Multiple myeloma not having achieved remission: Secondary | ICD-10-CM | POA: Diagnosis not present

## 2016-07-24 DIAGNOSIS — E785 Hyperlipidemia, unspecified: Secondary | ICD-10-CM | POA: Diagnosis not present

## 2016-07-24 DIAGNOSIS — D649 Anemia, unspecified: Secondary | ICD-10-CM | POA: Diagnosis not present

## 2016-07-24 DIAGNOSIS — I10 Essential (primary) hypertension: Secondary | ICD-10-CM | POA: Diagnosis not present

## 2016-07-24 DIAGNOSIS — Z794 Long term (current) use of insulin: Secondary | ICD-10-CM | POA: Diagnosis not present

## 2016-07-24 LAB — CBC WITH DIFFERENTIAL/PLATELET
Basophils Absolute: 0 10*3/uL (ref 0–0.1)
Basophils Relative: 1 %
Eosinophils Absolute: 0.1 10*3/uL (ref 0–0.7)
Eosinophils Relative: 4 %
HCT: 25.2 % — ABNORMAL LOW (ref 40.0–52.0)
Hemoglobin: 8.7 g/dL — ABNORMAL LOW (ref 13.0–18.0)
Lymphocytes Relative: 26 %
Lymphs Abs: 0.7 10*3/uL — ABNORMAL LOW (ref 1.0–3.6)
MCH: 37.3 pg — ABNORMAL HIGH (ref 26.0–34.0)
MCHC: 34.4 g/dL (ref 32.0–36.0)
MCV: 108.5 fL — ABNORMAL HIGH (ref 80.0–100.0)
Monocytes Absolute: 0.2 10*3/uL (ref 0.2–1.0)
Monocytes Relative: 10 %
Neutro Abs: 1.5 10*3/uL (ref 1.4–6.5)
Neutrophils Relative %: 59 %
Platelets: 62 10*3/uL — ABNORMAL LOW (ref 150–440)
RBC: 2.32 MIL/uL — ABNORMAL LOW (ref 4.40–5.90)
RDW: 17.3 % — ABNORMAL HIGH (ref 11.5–14.5)
WBC: 2.6 10*3/uL — ABNORMAL LOW (ref 3.8–10.6)

## 2016-07-24 LAB — TYPE AND SCREEN
ABO/RH(D): A NEG
Antibody Screen: NEGATIVE
Unit division: 0

## 2016-07-24 MED ORDER — TBO-FILGRASTIM 480 MCG/0.8ML ~~LOC~~ SOSY
480.0000 ug | PREFILLED_SYRINGE | Freq: Once | SUBCUTANEOUS | Status: AC
  Administered 2016-07-24: 480 ug via SUBCUTANEOUS
  Filled 2016-07-24: qty 0.8

## 2016-07-24 MED ORDER — FILGRASTIM 480 MCG/0.8ML IJ SOSY: 480.0000 ug | PREFILLED_SYRINGE | Freq: Once | INTRAMUSCULAR | Status: DC

## 2016-07-26 ENCOUNTER — Inpatient Hospital Stay: Payer: Medicare Other

## 2016-07-29 ENCOUNTER — Telehealth: Payer: Self-pay

## 2016-07-29 NOTE — Telephone Encounter (Addendum)
Alex Smith with Alex Smith left v/m requesting status of provider query faxed on 07/24/16 to [phone] for coding questions. Alex Smith said this will be a form for physician to fill out and not a billing question.[name] request cb. Ref # [id number].

## 2016-08-01 NOTE — Telephone Encounter (Signed)
Filled and in Kim's box. 

## 2016-08-02 ENCOUNTER — Other Ambulatory Visit: Payer: Self-pay

## 2016-08-02 ENCOUNTER — Inpatient Hospital Stay (HOSPITAL_BASED_OUTPATIENT_CLINIC_OR_DEPARTMENT_OTHER): Payer: Medicare Other

## 2016-08-02 ENCOUNTER — Encounter: Payer: Self-pay | Admitting: Family Medicine

## 2016-08-02 ENCOUNTER — Inpatient Hospital Stay: Payer: Medicare Other

## 2016-08-02 ENCOUNTER — Inpatient Hospital Stay (HOSPITAL_BASED_OUTPATIENT_CLINIC_OR_DEPARTMENT_OTHER): Payer: Medicare Other | Admitting: Hematology and Oncology

## 2016-08-02 ENCOUNTER — Ambulatory Visit (INDEPENDENT_AMBULATORY_CARE_PROVIDER_SITE_OTHER): Payer: Medicare Other | Admitting: Family Medicine

## 2016-08-02 VITALS — BP 147/68 | HR 65 | Temp 98.5°F | Resp 18 | Wt 221.1 lb

## 2016-08-02 VITALS — BP 164/60 | HR 78 | Temp 97.9°F | Wt 222.8 lb

## 2016-08-02 DIAGNOSIS — Z8619 Personal history of other infectious and parasitic diseases: Secondary | ICD-10-CM | POA: Diagnosis not present

## 2016-08-02 DIAGNOSIS — K5900 Constipation, unspecified: Secondary | ICD-10-CM | POA: Diagnosis not present

## 2016-08-02 DIAGNOSIS — D46Z Other myelodysplastic syndromes: Secondary | ICD-10-CM

## 2016-08-02 DIAGNOSIS — C9141 Hairy cell leukemia, in remission: Secondary | ICD-10-CM

## 2016-08-02 DIAGNOSIS — I10 Essential (primary) hypertension: Secondary | ICD-10-CM

## 2016-08-02 DIAGNOSIS — Z86010 Personal history of colonic polyps: Secondary | ICD-10-CM | POA: Diagnosis not present

## 2016-08-02 DIAGNOSIS — K219 Gastro-esophageal reflux disease without esophagitis: Secondary | ICD-10-CM

## 2016-08-02 DIAGNOSIS — R32 Unspecified urinary incontinence: Secondary | ICD-10-CM | POA: Diagnosis not present

## 2016-08-02 DIAGNOSIS — E119 Type 2 diabetes mellitus without complications: Secondary | ICD-10-CM | POA: Diagnosis not present

## 2016-08-02 DIAGNOSIS — Z803 Family history of malignant neoplasm of breast: Secondary | ICD-10-CM | POA: Diagnosis not present

## 2016-08-02 DIAGNOSIS — Z87891 Personal history of nicotine dependence: Secondary | ICD-10-CM

## 2016-08-02 DIAGNOSIS — Z79899 Other long term (current) drug therapy: Secondary | ICD-10-CM | POA: Diagnosis not present

## 2016-08-02 DIAGNOSIS — Z8669 Personal history of other diseases of the nervous system and sense organs: Secondary | ICD-10-CM | POA: Diagnosis not present

## 2016-08-02 DIAGNOSIS — Z794 Long term (current) use of insulin: Secondary | ICD-10-CM | POA: Diagnosis not present

## 2016-08-02 DIAGNOSIS — N400 Enlarged prostate without lower urinary tract symptoms: Secondary | ICD-10-CM | POA: Diagnosis not present

## 2016-08-02 DIAGNOSIS — Z85828 Personal history of other malignant neoplasm of skin: Secondary | ICD-10-CM

## 2016-08-02 DIAGNOSIS — D539 Nutritional anemia, unspecified: Secondary | ICD-10-CM

## 2016-08-02 DIAGNOSIS — D649 Anemia, unspecified: Secondary | ICD-10-CM | POA: Diagnosis not present

## 2016-08-02 DIAGNOSIS — E785 Hyperlipidemia, unspecified: Secondary | ICD-10-CM

## 2016-08-02 DIAGNOSIS — Z7982 Long term (current) use of aspirin: Secondary | ICD-10-CM | POA: Diagnosis not present

## 2016-08-02 DIAGNOSIS — E11319 Type 2 diabetes mellitus with unspecified diabetic retinopathy without macular edema: Secondary | ICD-10-CM | POA: Diagnosis not present

## 2016-08-02 DIAGNOSIS — R011 Cardiac murmur, unspecified: Secondary | ICD-10-CM

## 2016-08-02 DIAGNOSIS — C9000 Multiple myeloma not having achieved remission: Secondary | ICD-10-CM | POA: Diagnosis not present

## 2016-08-02 DIAGNOSIS — D701 Agranulocytosis secondary to cancer chemotherapy: Secondary | ICD-10-CM

## 2016-08-02 DIAGNOSIS — R5383 Other fatigue: Secondary | ICD-10-CM

## 2016-08-02 DIAGNOSIS — R0602 Shortness of breath: Secondary | ICD-10-CM | POA: Diagnosis not present

## 2016-08-02 DIAGNOSIS — E538 Deficiency of other specified B group vitamins: Secondary | ICD-10-CM | POA: Diagnosis not present

## 2016-08-02 DIAGNOSIS — T451X5A Adverse effect of antineoplastic and immunosuppressive drugs, initial encounter: Secondary | ICD-10-CM

## 2016-08-02 DIAGNOSIS — Z8701 Personal history of pneumonia (recurrent): Secondary | ICD-10-CM

## 2016-08-02 DIAGNOSIS — Z856 Personal history of leukemia: Secondary | ICD-10-CM | POA: Diagnosis not present

## 2016-08-02 LAB — CBC WITH DIFFERENTIAL/PLATELET
Basophils Absolute: 0 10*3/uL (ref 0–0.1)
Basophils Relative: 2 %
Eosinophils Absolute: 0.1 10*3/uL (ref 0–0.7)
Eosinophils Relative: 8 %
HCT: 22.9 % — ABNORMAL LOW (ref 40.0–52.0)
Hemoglobin: 7.8 g/dL — ABNORMAL LOW (ref 13.0–18.0)
Lymphocytes Relative: 36 %
Lymphs Abs: 0.5 10*3/uL — ABNORMAL LOW (ref 1.0–3.6)
MCH: 36.7 pg — ABNORMAL HIGH (ref 26.0–34.0)
MCHC: 34.2 g/dL (ref 32.0–36.0)
MCV: 107.1 fL — ABNORMAL HIGH (ref 80.0–100.0)
Monocytes Absolute: 0 10*3/uL — ABNORMAL LOW (ref 0.2–1.0)
Monocytes Relative: 3 %
Neutro Abs: 0.7 10*3/uL — ABNORMAL LOW (ref 1.4–6.5)
Neutrophils Relative %: 51 %
Platelets: 66 10*3/uL — ABNORMAL LOW (ref 150–440)
RBC: 2.13 MIL/uL — ABNORMAL LOW (ref 4.40–5.90)
RDW: 17 % — ABNORMAL HIGH (ref 11.5–14.5)
WBC: 1.4 10*3/uL — CL (ref 3.8–10.6)

## 2016-08-02 LAB — POC URINALSYSI DIPSTICK (AUTOMATED)
Bilirubin, UA: NEGATIVE
Blood, UA: NEGATIVE
Ketones, UA: NEGATIVE
LEUKOCYTES UA: NEGATIVE
Nitrite, UA: NEGATIVE
PROTEIN UA: NEGATIVE
Spec Grav, UA: >=1.03
UROBILINOGEN UA: 0.2
pH, UA: 5.5

## 2016-08-02 LAB — SAMPLE TO BLOOD BANK

## 2016-08-02 MED ORDER — FILGRASTIM 480 MCG/0.8ML IJ SOSY
480.0000 ug | PREFILLED_SYRINGE | Freq: Once | INTRAMUSCULAR | Status: AC
  Administered 2016-08-02: 480 ug via SUBCUTANEOUS
  Filled 2016-08-02: qty 0.8

## 2016-08-02 NOTE — Addendum Note (Signed)
Addended by: Royann Shivers A on: 08/02/2016 12:13 PM   Modules accepted: Orders

## 2016-08-02 NOTE — Assessment & Plan Note (Signed)
On flomax, asks we continue to fill as he has been released from urology care.

## 2016-08-02 NOTE — Assessment & Plan Note (Signed)
Appreciate onc care.  

## 2016-08-02 NOTE — Telephone Encounter (Signed)
Form faxed

## 2016-08-02 NOTE — Progress Notes (Addendum)
[facility] day:  08/02/2016   Chief Complaint: Alex Smith is a 81 y.o. male with a myelodysplastic marrow, smoldering myeloma, and a history of hairy cell leukemia who is seen for 2 week assessment after holding Revlimid.  HPI:  The patient was last seen in the medical oncology clinic on 07/19/2016.  At that time, he noted shortness of breath and constipation while taking Revlimid.  CBC revealed a hematocrit of 22.0, hemogloblin 7.5, platelets 71,000, WBC 1200 with an ANC of 700.  Revlimid was held.  He received GCSF x 4 days (07/19/2016; 07/22/2016 - 07/24/2016).  CBC on 07/22/2016 revealed a hematocrit of 21.9, hemoglobin 7.5, platelets 65,000, WBC 1500 with an ANC of 900.  He received 1 unit of washed RBCs on 07/22/2016.  CBC on 07/24/2016 revealed a hematocrit of 25.2, hemoglobin 8.7, platelets 62,000, WBC 2600 with an ANC of 1500.  Symptomatically, he notes shortness of breath on exertion.  He denies any excess bruising or bleeding.  He denies any fevers.   Past Medical History:  Diagnosis Date  . Anemia   . B12 deficiency   . Basal cell carcinoma of face   . BPH (benign prostatic hypertrophy)    followed by urology, discharged (Dr. Bernardo Heater)  . CAP (community acquired pneumonia) 02/15/2015  . Colon polyps   . Diverticulosis   . GERD (gastroesophageal reflux disease)   . Hairy cell leukemia ([place]) 2006   recurrent, seizure on rituxan, now on cladribine ([place])  . History of pneumonia 2000's   "once" (07/07/2012)  . History of shingles   . HLD (hyperlipidemia)   . Hypertension   . Pneumonia   . Shortness of breath dyspnea   . Systolic murmur 31/54/0086  . Type 2 diabetes, controlled, with retinopathy ([place])     Past Surgical History:  Procedure Laterality Date  . [place] VITRECTOMY WITH 20 GAUGE MVR PORT FOR MACULAR HOLE  07/07/2012   Procedure: 25 GAUGE PARS PLANA VITRECTOMY WITH 20 GAUGE MVR PORT FOR MACULAR  HOLE;  Surgeon: Hayden Pedro, MD;  Location: [facility];  Service: Ophthalmology;  Laterality: Left;  . BONE MARROW BIOPSY  2016  . CARDIOVASCULAR STRESS TEST  2013   treadmill - no evidence ischemia, EF 61%  . CATARACT EXTRACTION W/ INTRAOCULAR LENS  IMPLANT, BILATERAL  ~ 2010  . COLONOSCOPY  2014   [place] WNL no rpt needed, h/o polyps  . EYE SURGERY Left 06/2012   laser surgery  . GAS INSERTION  07/07/2012   Procedure: INSERTION OF GAS;  Surgeon: Hayden Pedro, MD;  Location: [facility];  Service: Ophthalmology;  Laterality: Left;  [id number]  . PERIPHERAL VASCULAR CATHETERIZATION N/A 02/23/2015   Procedure: Glori Luis Cath Insertion;  Surgeon: Algernon Huxley, MD;  Location: [facility];  Service: Cardiovascular;  Laterality: N/A;  . SERUM PATCH  07/07/2012   Procedure: SERUM PATCH;  Surgeon: Hayden Pedro, MD;  Location: [facility];  Service: Ophthalmology;  Laterality: Left;  . SKIN CANCER EXCISION     "all over my face" (07/07/2012)    Family History  Problem Relation Age of Onset  . Dementia Mother   . Heart failure Father 5  . Cancer Sister     breast  . Diabetes Paternal Uncle   . Diabetes Paternal Aunt   . CAD Brother 54    MI  . Stroke Neg Hx     Social History:  reports that  he quit smoking about 47 years ago. His smoking use included Cigarettes. He has a 50.00 pack-year smoking history. He has never used smokeless tobacco. He reports that he drinks alcohol. He reports that he does not use drugs.  He works at [employer] 20-32 hours/week.  The patient 's wife has had 3 hip operations.  She underwent hip replacement 03/06/2016.  She recently hurt her shoulder.  He takes care of his wife.  He states that they either eat out or bring food in.  His best contact number is his cell: [phone].  He is alone today.  Allergies:  Allergies  Allergen Reactions  . Rituximab Rash    Chest tightness  . Blood-Group Specific Substance Other (See Comments)    Had a post transfusion reaction of  red blood cells; NOW REQUIRES WASHED BLOOD CELLS  . Primaxin [Imipenem] Other (See Comments)    Possible allergy  . Sulfa Antibiotics Itching and Rash  Possible allergy to Zoloft, allopurinol, and a chemotherapy medication.  He tolerates Dapsone.  Current Medications: Current Outpatient Prescriptions  Medication Sig Dispense Refill  . acyclovir (ZOVIRAX) 400 MG tablet TAKE TWO TABLETS TWICE DAILY 120 tablet 3  . aspirin EC 81 MG tablet Take 81 mg by mouth daily.    Marland Kitchen glucose blood (ACCU-CHEK AVIVA PLUS) test strip Use to check sugar twice daily Dx:E11.9 200 each 3  . HUMULIN R 100 UNIT/ML injection INJECT 10 UNITS SUBCUTANEOUSLY TWICE DAILY BEFORE MEALS 10 mL 6  . hydrochlorothiazide (HYDRODIURIL) 25 MG tablet TAKE ONE TABLET BY MOUTH EVERY DAY 30 tablet 3  . insulin NPH Human (HUMULIN N) 100 UNIT/ML injection Inject 0.39 mLs (39 Units total) into the skin 2 (two) times daily before a meal.    . lidocaine-prilocaine (EMLA) cream Apply 1 application topically as needed. 30 g 3  . lisinopril (PRINIVIL,ZESTRIL) 20 MG tablet TAKE ONE TABLET TWICE DAILY 60 tablet 6  . metFORMIN (GLUCOPHAGE) 1000 MG tablet TAKE ONE TABLET BY MOUTH TWICE DAILY AS DIRECTED 180 tablet 3  . MICROLET LANCETS MISC USE AS DIRECTED 200 each 3  . pravastatin (PRAVACHOL) 20 MG tablet TAKE ONE TABLET AT BEDTIME 30 tablet 3  . ranitidine (ZANTAC) 150 MG tablet Take 1 tablet (150 mg total) by mouth 2 (two) times daily as needed. 180 tablet 3  . tamsulosin (FLOMAX) 0.4 MG CAPS capsule Take 0.4 mg by mouth daily.    . temazepam (RESTORIL) 7.5 MG capsule Take 7.5 mg by mouth once.     . traZODone (DESYREL) 50 MG tablet Take 0.5-1 tablets (25-50 mg total) by mouth at bedtime as needed for sleep. 30 tablet 3  . vitamin B-12 (CYANOCOBALAMIN) 1000 MCG tablet Take 1,000 mcg by mouth daily.    . vitamin E 400 UNIT capsule Take 200 Units by mouth daily.     Marland Kitchen lenalidomide (REVLIMID) 10 MG capsule 10 mg a day for 21 days then 7 days off  21 capsule 2   No current facility-administered medications for this visit.    Facility-Administered Medications Ordered in Other Visits  Medication Dose Route Frequency Provider Last Rate Last Dose  . heparin lock flush 100 unit/mL  500 Units Intravenous Once Lequita Asal, MD      . sodium chloride 0.9 % injection 10 mL  10 mL Intravenous PRN Lequita Asal, MD   10 mL at 03/03/15 0903  . sodium chloride 0.9 % injection 10 mL  10 mL Intracatheter PRN Lequita Asal, MD  10 mL at 03/10/15 1410   Review of Systems:  GENERAL:  Chronic fatigue.  No fevers or sweats.  Weight up 3 pounds. PERFORMANCE STATUS (ECOG):  1 HEENT: No visual changes, runny nose, sore throat. Pulmonary:  Shortness of breath on exertion.  No cough.  No hemoptysis. Cardiac:  No chest pain, palpitations, orthopnea, or PND. GI:  No nausea, vomiting, diarrhea, constipation, melena or hematochezia.  Drinks Boost. GU:  Some urinary incontinence.  On Flomax.  No urgency, frequency, dysuria, or hematuria. Musculoskeletal:  No back pain.  No joint pain.  No muscle tenderness. Extremities:  No pain or swelling. Skin:  No rashes or skin changes. Neuro:  No headache, numbness or weakness, balance or coordination issues. Endocrine:  Diabetes.  No thyroid issues, hot flashes or night sweats. Psych:  No mood changes, depression or anxiety.  Pain:  No focal pain. Review of systems:  All other systems reviewed and found to be negative.   Physical Exam: Blood pressure (!) 147/68, pulse 65, temperature 98.5 F (36.9 C), temperature source Tympanic, resp. rate 18, weight 221 lb 1.9 oz (100.3 kg).  GENERAL:  Well developed, well nourished, gentleman sitting comfortably in the exam room in no acute distress. MENTAL STATUS:  Alert and oriented to person, place and time. HEAD:  Pearline Cables white hair.  Normocephalic, atraumatic, face symmetric, no Cushingoid features. EYES:  Glasses. Blue eyes.  Pupils equal round and reactive to  light and accomodation.  No conjunctivitis or scleral icterus. ENT:  Oropharynx clear without lesion.  Tongue normal. Mucous membranes moist.  RESPIRATORY:  Clear to auscultation without rales, wheezes or rhonchi. CARDIOVASCULAR:  Regular rate and rhythm without murmur, rub or gallop. ABDOMEN:  Soft, non-tender, with active bowel sounds, and no hepatosplenomegaly.  No masses. SKIN:  No rashes or ulcers. EXTREMITIES: No edema, no skin discoloration or tenderness.  No palpable cords. LYMPH NODES: No palpable cervical, supraclavicular, axillary or inguinal adenopathy  NEUROLOGICAL: Unremarkable. PSYCH:  Appropriate.    Orders Only on 08/02/2016  Component Date Value Ref Range Status  . WBC 08/02/2016 1.4* 3.8 - 10.6 K/uL Final   Comment: [facility] CRITICAL VALUE PROTOCOL RESULT REPEATED AND VERIFIED   . RBC 08/02/2016 2.13* 4.40 - 5.90 MIL/uL Final  . Hemoglobin 08/02/2016 7.8* 13.0 - 18.0 g/dL Final  . HCT 08/02/2016 22.9* 40.0 - 52.0 % Final  . MCV 08/02/2016 107.1* 80.0 - 100.0 fL Final  . MCH 08/02/2016 36.7* 26.0 - 34.0 pg Final  . MCHC 08/02/2016 34.2  32.0 - 36.0 g/dL Final  . RDW 08/02/2016 17.0* 11.5 - 14.5 % Final  . Platelets 08/02/2016 66* 150 - 440 K/uL Final  . Neutrophils Relative % 08/02/2016 51  % Final  . Neutro Abs 08/02/2016 0.7* 1.4 - 6.5 K/uL Final  . Lymphocytes Relative 08/02/2016 36  % Final  . Lymphs Abs 08/02/2016 0.5* 1.0 - 3.6 K/uL Final  . Monocytes Relative 08/02/2016 3  % Final  . Monocytes Absolute 08/02/2016 0.0* 0.2 - 1.0 K/uL Final  . Eosinophils Relative 08/02/2016 8  % Final  . Eosinophils Absolute 08/02/2016 0.1  0 - 0.7 K/uL Final  . Basophils Relative 08/02/2016 2  % Final  . Basophils Absolute 08/02/2016 0.0  0 - 0.1 K/uL Final  . Blood Bank Specimen 08/02/2016 SAMPLE AVAILABLE FOR TESTING   Final  . Sample Expiration 08/02/2016 08/05/2016   Final  Office Visit on 08/02/2016  Component Date Value Ref Range Status  . Color, UA  08/02/2016 Yellow  Final  . Clarity, UA 08/02/2016 Clear   Final  . Glucose, UA 08/02/2016 Large   Final  . Bilirubin, UA 08/02/2016 Negative   Final  . Ketones, UA 08/02/2016 Negative   Final  . Spec Grav, UA 08/02/2016 >=1.030   Final  . Blood, UA 08/02/2016 Negative   Final  . pH, UA 08/02/2016 5.5   Final  . Protein, UA 08/02/2016 Negative   Final  . Urobilinogen, UA 08/02/2016 0.2   Final  . Nitrite, UA 08/02/2016 Negative   Final  . Leukocytes, UA 08/02/2016 Negative  Negative Final    Assessment:  ZAKARIYYA HELFMAN is a 81 y.o. 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  He was diagnosed with hairy cell leukemia in 2006.  He received cladribine 11/06-11/05/2005.  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   Bone marrow aspirate and biopsy on 02/07/2015 revealed a extensive marrow involvement by recurrent hairy cell leukemia (approximately 80% of cells in the core). There was a monoclonal plasma cell infiltrate (approximately 10%), compatible with a plasma cell neoplasm. Marrow was hypercellular (40-50%) with markedly diminished residual trilineage hematopoiesis and mild multilineage dyspoiesis.  Peripheral smear revealed leukopenia (WBC 1900) with 2% atypical lymphoid cells compatible with hairy cell leukemia. There was moderate anemia with anisopoikilocytosis. 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  FISH studies for MDS were negative.  Cytogenetics were normal (46, XY).  He has an unclear allergy history.  He may be allergic to Primaxin, Zoloft, allopurinol, and a chemotherapy medication.  Reaction occurred in 06/2005.  He is allergic to sulfa.  He is not allergic to dapsone.  He requires washed RBCs.  He is on prophylactic acyclovir.  Dapsone and voriconazole were discontinued.  He has a history of shingles prior to prophylactic acyclovir.  He is 13 months status post cladribine  (began 03/03/2015).  His counts plateaued on 07/25/2015 and have subsequently decreased.  Labs on 07/25/2015 revealed a normal B12 (894), folate (60.5), and TSH (2.562).  Ferritin was 52.  Bone marrow on 11/03/2015 revealed persistent plasma cell neoplasm, now with mildy atypical monoclonal plasma cells estimated at 20-30%.   There was no morphologic evidence of residual hairy cell leukemia.  There was a minute population (0.03%) with hairy cell immunophenotype detected by flow.  Marrow was normocellular for age with relative erythroid hyperplasia, relative myeloid hypoplasia, mild dyspoiesis, adequate megakaryocytes and no increased blasts.  There was no significant increase in marrow reticulin fibers.  Iron was present.  FISH studies for MDS were negative.  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  Cytogenetics were normal (46, XY).  Bone marrow on 03/13/2016 was normocellular to marginally hypercellular for age (20-40%) with relative erythroid hyperplasia and mild dyserythropoiesis, decreased granulopoiesis and adequate megakaryocytes.  There was a persistent 15-20% plasma cell neoplasm.  There was no overt increase in reticulin fibrosis.  Storage iron was present.  Flow cytometry revealed abnormal/monocytic plasma cell population (3% sample), consistent with a plasma cell neoplastic process.  Cytogenetics were normal (46, XY).  FISH studies for myeloma revealed a CCND1/IGH translocation, t(11;14) in 53% of nuclei.  SPEP on 11/17/2015 revealed a 1.3 gm/dL monoclonal spike (1.4 gm/dL on 07/25/2015 and 1.0 on 02/03/2015).  Kappa free light chains were 50.51, lambda free light chains were 10.48 with a ratio of 4.82 (ratio 3.68 on 07/25/2015).  Beta2-microglobulin was 2.5.  24 hour urine  on 11/21/2015 revealed 234.8 mg protein in 24 hours (no monoclonal protein).  Bone survey on 11/24/2015 revealed no lytic lesions.  Epo level was 329.6 on 03/29/2016.  He began a  trial of Procrit.  He received 10,000 units of Procrit weekly (04/26/2016 - 06/14/2016).  Hemoglobin ranged between 8.4 and 8.9 without trend.  He completed 2 cycle of Revlimid and Decadron (06/29/2016 - 07/19/2016).  Cycle #1 was complicated by anemia and neutropenia.  He received 1 unit of washed RBCs on 07/22/2016.  He received GCSF x 4 days.  He is on aspirin 81 mg a day prophylaxis.  PET scan on 03/14/2016 revealed no evidence of hypermetabolic lymphadenopathy or focal skeletal lesions to suggest marrow or cortical bone involvement.  Symptomatically, he has shortness of breath on exertion.  Exam is stable.  Hematocrit is 22.9 with a hemoglobin of 7.8.  ANC is 700.  Plan: 1.  Labs today:  CBC with diff, hold tube. 2.  Continue to hold Revlimid 3.  Review neutropenic precautions. 4.  Neupogen 480 mcg today 5.  RTC on 08/05/2016 for labs (CBC with diff, hold tube) +/- Neupogen. 6.  RTC on 08/13/2016 for labs (CBC with diff, hold tube ) +/- Neupogen. 7.  RTC on 08/20/2016 for MD assessment and labs (CBC with diff, CMP, hold tube).   Lequita Asal, MD  08/02/2016, 3:41 PM

## 2016-08-02 NOTE — Patient Instructions (Addendum)
Continue current medicines Urinalysis today Blood pressure is staying high - start monitoring blood pressure at home and keep log. Let me know in 2 weeks how blood pressures are running.  Return in 4 months for physical.

## 2016-08-02 NOTE — Assessment & Plan Note (Signed)
Chronic. Stable period - off decadron at this time. Overall running well.

## 2016-08-02 NOTE — Progress Notes (Addendum)
BP (!) 164/60 (BP Location: Right Arm, Cuff Size: Large)   Pulse 78   Temp 97.9 F (36.6 C) (Oral)   Wt 222 lb 12 oz (101 kg)   BMI 36.50 kg/m    CC: DM f/u Subjective:    Patient ID: Alex Smith, male    DOB: [birth year], 81 y.o.   MRN: [id number]  HPI: Alex Smith is a 81 y.o. male presenting on 08/02/2016 for Follow-up   See prior note for details. Hairy cell leukemia followed by Dr [name], along with smoldering stage I MM, early myelodysplasia and chronic fatigue. Latest started on decadron 20mg  weekly + revlimid 21 d cycle. Revlimid caused anemia/neutropenia. Planning to f/u with onc re plans for revlimid. Currently off decadron.   DM - regularly does check sugars - 140s. Compliant with antihyperglycemic regimen which includes: NPH 39u BID and regular insulin 10u BID with meals, metformin 1000mg  bid. Denies low sugars or hypoglycemic symptoms. Denies paresthesias. Last diabetic eye exam 01/2016.  Pneumovax: 2006.  Prevnar: 2015.  Lab Results  Component Value Date   HGBA1C 6.9 (H) 05/24/2016   Diabetic Foot Exam - Simple   Simple Foot Form Diabetic Foot exam was performed with the following findings:  Yes 08/02/2016  9:48 AM  Visual Inspection See comments:  Yes Sensation Testing Intact to touch and monofilament testing bilaterally:  Yes Pulse Check Posterior Tibialis and Dorsalis pulse intact bilaterally:  Yes Comments Toenail deformity with elongation      Due to weekly decadron, we added weekly SSI (no extra insulin of cbg <150. 1 unit if cbg 150-200. 2 units if 200-250. 3 units if cbg 250-300. 4 units if cbg 300-350, 5 units of >350) to mealtime insulin 2 d after each decadron treatment.   Ongoing urinary incontinence noted over last 6 months. Accidents twice daily (leaking). Not using depens pads. 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 No stress incontinence sxs. Denies urge.   Relevant past medical,  surgical, family and social history reviewed and updated as indicated. Interim medical history since our last visit reviewed. Allergies and medications reviewed and updated. Current Outpatient Prescriptions on File Prior to Visit  Medication Sig  . acyclovir (ZOVIRAX) 400 MG tablet TAKE TWO TABLETS TWICE DAILY  . aspirin EC 81 MG tablet Take 81 mg by mouth daily.  Marland Kitchen glucose blood (ACCU-CHEK AVIVA PLUS) test strip Use to check sugar twice daily Dx:E11.9  . HUMULIN R 100 UNIT/ML injection INJECT 10 UNITS SUBCUTANEOUSLY TWICE DAILY BEFORE MEALS  . hydrochlorothiazide (HYDRODIURIL) 25 MG tablet TAKE ONE TABLET BY MOUTH EVERY DAY  . insulin NPH Human (HUMULIN N) 100 UNIT/ML injection Inject 0.39 mLs (39 Units total) into the skin 2 (two) times daily before a meal.  . lidocaine-prilocaine (EMLA) cream Apply 1 application topically as needed.  Marland Kitchen lisinopril (PRINIVIL,ZESTRIL) 20 MG tablet TAKE ONE TABLET TWICE DAILY  . metFORMIN (GLUCOPHAGE) 1000 MG tablet TAKE ONE TABLET BY MOUTH TWICE DAILY AS DIRECTED  . MICROLET LANCETS MISC USE AS DIRECTED  . pravastatin (PRAVACHOL) 20 MG tablet TAKE ONE TABLET AT BEDTIME  . ranitidine (ZANTAC) 150 MG tablet Take 1 tablet (150 mg total) by mouth 2 (two) times daily as needed.  . tamsulosin (FLOMAX) 0.4 MG CAPS capsule Take 0.4 mg by mouth daily.  . temazepam (RESTORIL) 7.5 MG capsule Take 7.5 mg by mouth once.   . traZODone (DESYREL) 50 MG tablet Take 0.5-1 tablets (  25-50 mg total) by mouth at bedtime as needed for sleep.  . vitamin B-12 (CYANOCOBALAMIN) 1000 MCG tablet Take 1,000 mcg by mouth daily.  . vitamin E 400 UNIT capsule Take 200 Units by mouth daily.   Marland Kitchen lenalidomide (REVLIMID) 10 MG capsule 10 mg a day for 21 days then 7 days off (Patient not taking: Reported on 08/02/2016)   Current Facility-Administered Medications on File Prior to Visit  Medication  . heparin lock flush 100 unit/mL  . sodium chloride 0.9 % injection 10 mL  . sodium chloride 0.9  % injection 10 mL    Review of Systems Per HPI unless specifically indicated in ROS section     Objective:    BP (!) 164/60 (BP Location: Right Arm, Cuff Size: Large)   Pulse 78   Temp 97.9 F (36.6 C) (Oral)   Wt 222 lb 12 oz (101 kg)   BMI 36.50 kg/m   Wt Readings from Last 3 Encounters:  08/02/16 222 lb 12 oz (101 kg)  07/19/16 218 lb 4.1 oz (99 kg)  06/28/16 220 lb 10.9 oz (100.1 kg)    Physical Exam  Constitutional: He appears well-developed and well-nourished. No distress.  HENT:  Head: Normocephalic and atraumatic.  Right Ear: External ear normal.  Left Ear: External ear normal.  Nose: Nose normal.  Mouth/Throat: Oropharynx is clear and moist. No oropharyngeal exudate.  Eyes: Conjunctivae and EOM are normal. Pupils are equal, round, and reactive to light. No scleral icterus.  Neck: Normal range of motion. Neck supple.  Cardiovascular: Normal rate, regular rhythm, normal heart sounds and intact distal pulses.   No murmur heard. Pulmonary/Chest: Effort normal and breath sounds normal. No respiratory distress. He has no wheezes. He has no rales.  Musculoskeletal: He exhibits edema (1+ pedal edema).  See HPI for foot exam if done  Lymphadenopathy:    He has no cervical adenopathy.  Skin: Skin is warm and dry. No rash noted.  Psychiatric: He has a normal mood and affect.  Nursing note and vitals reviewed.  Results for orders placed or performed in visit on 07/24/16  CBC with Differential/Platelet  Result Value Ref Range   WBC 2.6 (L) 3.8 - 10.6 K/uL   RBC 2.32 (L) 4.40 - 5.90 MIL/uL   Hemoglobin 8.7 (L) 13.0 - 18.0 g/dL   HCT 25.2 (L) 40.0 - 52.0 %   MCV 108.5 (H) 80.0 - 100.0 fL   MCH 37.3 (H) 26.0 - 34.0 pg   MCHC 34.4 32.0 - 36.0 g/dL   RDW 17.3 (H) 11.5 - 14.5 %   Platelets 62 (L) 150 - 440 K/uL   Neutrophils Relative % 59 %   Neutro Abs 1.5 1.4 - 6.5 K/uL   Lymphocytes Relative 26 %   Lymphs Abs 0.7 (L) 1.0 - 3.6 K/uL   Monocytes Relative 10 %    Monocytes Absolute 0.2 0.2 - 1.0 K/uL   Eosinophils Relative 4 %   Eosinophils Absolute 0.1 0 - 0.7 K/uL   Basophils Relative 1 %   Basophils Absolute 0.0 0 - 0.1 K/uL      Assessment & Plan:   Problem List Items Addressed This Visit    Benign prostatic hyperplasia    On flomax, asks we continue to fill as he has been released from urology care.      Essential hypertension    Chronic, bp elevated today. Advised start monitoring at home and to notify us if persistently >140/90  Hairy cell leukemia, in remission ([place])    Appreciate onc care.      Type 2 diabetes, controlled, with retinopathy ([place]) - Primary    Chronic. Stable period - off decadron at this time. Overall running well.       Urinary incontinence    Describes some urinary incontinence without sensory awareness - ?DM related. Start with UA today. Continue flomax (although pt endorses not very effective)          Follow up plan: Return in about 4 months (around 12/01/2016) for annual exam, prior fasting for blood work.  Ria Bush, MD

## 2016-08-02 NOTE — Progress Notes (Signed)
Patient states he is on his break from Revlimid at this time.  States he does not know if he is going to start it back.  States he does not like the fact that it took his blood counts down so low.

## 2016-08-02 NOTE — Assessment & Plan Note (Signed)
Describes some urinary incontinence without sensory awareness - ?DM related. Start with UA today. Continue flomax (although pt endorses not very effective)

## 2016-08-02 NOTE — Progress Notes (Signed)
Pre visit review using our clinic review tool, if applicable. No additional management support is needed unless otherwise documented below in the visit note. 

## 2016-08-02 NOTE — Assessment & Plan Note (Signed)
Chronic, bp elevated today. Advised start monitoring at home and to notify us if persistently >140/90

## 2016-08-04 ENCOUNTER — Other Ambulatory Visit: Payer: Self-pay | Admitting: *Deleted

## 2016-08-04 DIAGNOSIS — C9141 Hairy cell leukemia, in remission: Secondary | ICD-10-CM

## 2016-08-04 DIAGNOSIS — C9000 Multiple myeloma not having achieved remission: Secondary | ICD-10-CM

## 2016-08-05 ENCOUNTER — Other Ambulatory Visit: Payer: Self-pay | Admitting: Hematology and Oncology

## 2016-08-05 ENCOUNTER — Inpatient Hospital Stay: Payer: Medicare Other

## 2016-08-05 ENCOUNTER — Other Ambulatory Visit: Payer: Self-pay | Admitting: *Deleted

## 2016-08-05 DIAGNOSIS — D469 Myelodysplastic syndrome, unspecified: Secondary | ICD-10-CM

## 2016-08-05 DIAGNOSIS — K5900 Constipation, unspecified: Secondary | ICD-10-CM | POA: Diagnosis not present

## 2016-08-05 DIAGNOSIS — E538 Deficiency of other specified B group vitamins: Secondary | ICD-10-CM | POA: Diagnosis not present

## 2016-08-05 DIAGNOSIS — D46Z Other myelodysplastic syndromes: Secondary | ICD-10-CM

## 2016-08-05 DIAGNOSIS — Z8669 Personal history of other diseases of the nervous system and sense organs: Secondary | ICD-10-CM | POA: Diagnosis not present

## 2016-08-05 DIAGNOSIS — I10 Essential (primary) hypertension: Secondary | ICD-10-CM | POA: Diagnosis not present

## 2016-08-05 DIAGNOSIS — K219 Gastro-esophageal reflux disease without esophagitis: Secondary | ICD-10-CM | POA: Diagnosis not present

## 2016-08-05 DIAGNOSIS — D649 Anemia, unspecified: Secondary | ICD-10-CM | POA: Diagnosis not present

## 2016-08-05 DIAGNOSIS — Z8619 Personal history of other infectious and parasitic diseases: Secondary | ICD-10-CM | POA: Diagnosis not present

## 2016-08-05 DIAGNOSIS — Z794 Long term (current) use of insulin: Secondary | ICD-10-CM | POA: Diagnosis not present

## 2016-08-05 DIAGNOSIS — Z7982 Long term (current) use of aspirin: Secondary | ICD-10-CM | POA: Diagnosis not present

## 2016-08-05 DIAGNOSIS — R5383 Other fatigue: Secondary | ICD-10-CM | POA: Diagnosis not present

## 2016-08-05 DIAGNOSIS — Z85828 Personal history of other malignant neoplasm of skin: Secondary | ICD-10-CM | POA: Diagnosis not present

## 2016-08-05 DIAGNOSIS — Z79899 Other long term (current) drug therapy: Secondary | ICD-10-CM | POA: Diagnosis not present

## 2016-08-05 DIAGNOSIS — C9000 Multiple myeloma not having achieved remission: Secondary | ICD-10-CM

## 2016-08-05 DIAGNOSIS — N400 Enlarged prostate without lower urinary tract symptoms: Secondary | ICD-10-CM | POA: Diagnosis not present

## 2016-08-05 DIAGNOSIS — Z856 Personal history of leukemia: Secondary | ICD-10-CM | POA: Diagnosis not present

## 2016-08-05 DIAGNOSIS — E785 Hyperlipidemia, unspecified: Secondary | ICD-10-CM | POA: Diagnosis not present

## 2016-08-05 DIAGNOSIS — Z803 Family history of malignant neoplasm of breast: Secondary | ICD-10-CM | POA: Diagnosis not present

## 2016-08-05 DIAGNOSIS — Z87891 Personal history of nicotine dependence: Secondary | ICD-10-CM | POA: Diagnosis not present

## 2016-08-05 DIAGNOSIS — R011 Cardiac murmur, unspecified: Secondary | ICD-10-CM | POA: Diagnosis not present

## 2016-08-05 DIAGNOSIS — C9141 Hairy cell leukemia, in remission: Secondary | ICD-10-CM

## 2016-08-05 DIAGNOSIS — E119 Type 2 diabetes mellitus without complications: Secondary | ICD-10-CM | POA: Diagnosis not present

## 2016-08-05 DIAGNOSIS — Z86010 Personal history of colonic polyps: Secondary | ICD-10-CM | POA: Diagnosis not present

## 2016-08-05 DIAGNOSIS — R0602 Shortness of breath: Secondary | ICD-10-CM | POA: Diagnosis not present

## 2016-08-05 LAB — CBC WITH DIFFERENTIAL/PLATELET
Basophils Absolute: 0 10*3/uL (ref 0–0.1)
Basophils Relative: 1 %
Eosinophils Absolute: 0.1 10*3/uL (ref 0–0.7)
Eosinophils Relative: 9 %
HCT: 21.8 % — ABNORMAL LOW (ref 40.0–52.0)
Hemoglobin: 7.4 g/dL — ABNORMAL LOW (ref 13.0–18.0)
Lymphocytes Relative: 35 %
Lymphs Abs: 0.5 10*3/uL — ABNORMAL LOW (ref 1.0–3.6)
MCH: 36.8 pg — ABNORMAL HIGH (ref 26.0–34.0)
MCHC: 34.2 g/dL (ref 32.0–36.0)
MCV: 107.8 fL — ABNORMAL HIGH (ref 80.0–100.0)
Monocytes Absolute: 0.1 10*3/uL — ABNORMAL LOW (ref 0.2–1.0)
Monocytes Relative: 7 %
Neutro Abs: 0.7 10*3/uL — ABNORMAL LOW (ref 1.4–6.5)
Neutrophils Relative %: 48 %
Platelets: 56 10*3/uL — ABNORMAL LOW (ref 150–440)
RBC: 2.02 MIL/uL — ABNORMAL LOW (ref 4.40–5.90)
RDW: 17.4 % — ABNORMAL HIGH (ref 11.5–14.5)
WBC: 1.4 10*3/uL — CL (ref 3.8–10.6)

## 2016-08-05 LAB — SAMPLE TO BLOOD BANK

## 2016-08-05 MED ORDER — FILGRASTIM 480 MCG/0.8ML IJ SOSY: 480.0000 ug | PREFILLED_SYRINGE | Freq: Once | INTRAMUSCULAR | Status: DC

## 2016-08-05 MED ORDER — TBO-FILGRASTIM 480 MCG/0.8ML ~~LOC~~ SOSY
480.0000 ug | PREFILLED_SYRINGE | Freq: Once | SUBCUTANEOUS | Status: AC
  Administered 2016-08-05: 480 ug via SUBCUTANEOUS
  Filled 2016-08-05: qty 0.8

## 2016-08-05 MED ORDER — TBO-FILGRASTIM 480 MCG/0.8ML ~~LOC~~ SOSY
480.0000 ug | PREFILLED_SYRINGE | Freq: Once | SUBCUTANEOUS | Status: AC
  Administered 2016-08-02: 480 ug via SUBCUTANEOUS

## 2016-08-05 MED ORDER — FILGRASTIM 480 MCG/0.8ML IJ SOSY
480.0000 ug | PREFILLED_SYRINGE | Freq: Once | INTRAMUSCULAR | Status: DC
  Filled 2016-08-05: qty 0.8

## 2016-08-05 NOTE — Progress Notes (Signed)
Patient's hemoglobin is 7.4 today.  Discuss with Dr. [name] and she would like him to receive 2 units of blood this week due to no longer getting the Procrit injections.  He has been scheduled for transfusion on 08/07/17 @ 9:00.

## 2016-08-07 ENCOUNTER — Ambulatory Visit: Payer: Medicare Other

## 2016-08-07 ENCOUNTER — Inpatient Hospital Stay: Payer: Medicare Other

## 2016-08-07 DIAGNOSIS — Z794 Long term (current) use of insulin: Secondary | ICD-10-CM | POA: Diagnosis not present

## 2016-08-07 DIAGNOSIS — K5900 Constipation, unspecified: Secondary | ICD-10-CM | POA: Diagnosis not present

## 2016-08-07 DIAGNOSIS — Z86010 Personal history of colonic polyps: Secondary | ICD-10-CM | POA: Diagnosis not present

## 2016-08-07 DIAGNOSIS — R5383 Other fatigue: Secondary | ICD-10-CM | POA: Diagnosis not present

## 2016-08-07 DIAGNOSIS — R011 Cardiac murmur, unspecified: Secondary | ICD-10-CM | POA: Diagnosis not present

## 2016-08-07 DIAGNOSIS — Z87891 Personal history of nicotine dependence: Secondary | ICD-10-CM | POA: Diagnosis not present

## 2016-08-07 DIAGNOSIS — E785 Hyperlipidemia, unspecified: Secondary | ICD-10-CM | POA: Diagnosis not present

## 2016-08-07 DIAGNOSIS — D649 Anemia, unspecified: Secondary | ICD-10-CM | POA: Diagnosis not present

## 2016-08-07 DIAGNOSIS — D469 Myelodysplastic syndrome, unspecified: Secondary | ICD-10-CM

## 2016-08-07 DIAGNOSIS — Z79899 Other long term (current) drug therapy: Secondary | ICD-10-CM | POA: Diagnosis not present

## 2016-08-07 DIAGNOSIS — E119 Type 2 diabetes mellitus without complications: Secondary | ICD-10-CM | POA: Diagnosis not present

## 2016-08-07 DIAGNOSIS — R0602 Shortness of breath: Secondary | ICD-10-CM | POA: Diagnosis not present

## 2016-08-07 DIAGNOSIS — E538 Deficiency of other specified B group vitamins: Secondary | ICD-10-CM | POA: Diagnosis not present

## 2016-08-07 DIAGNOSIS — Z8669 Personal history of other diseases of the nervous system and sense organs: Secondary | ICD-10-CM | POA: Diagnosis not present

## 2016-08-07 DIAGNOSIS — C9000 Multiple myeloma not having achieved remission: Secondary | ICD-10-CM | POA: Diagnosis not present

## 2016-08-07 DIAGNOSIS — K219 Gastro-esophageal reflux disease without esophagitis: Secondary | ICD-10-CM | POA: Diagnosis not present

## 2016-08-07 DIAGNOSIS — Z8619 Personal history of other infectious and parasitic diseases: Secondary | ICD-10-CM | POA: Diagnosis not present

## 2016-08-07 DIAGNOSIS — I10 Essential (primary) hypertension: Secondary | ICD-10-CM | POA: Diagnosis not present

## 2016-08-07 DIAGNOSIS — Z7982 Long term (current) use of aspirin: Secondary | ICD-10-CM | POA: Diagnosis not present

## 2016-08-07 DIAGNOSIS — Z803 Family history of malignant neoplasm of breast: Secondary | ICD-10-CM | POA: Diagnosis not present

## 2016-08-07 DIAGNOSIS — N400 Enlarged prostate without lower urinary tract symptoms: Secondary | ICD-10-CM | POA: Diagnosis not present

## 2016-08-07 DIAGNOSIS — Z85828 Personal history of other malignant neoplasm of skin: Secondary | ICD-10-CM | POA: Diagnosis not present

## 2016-08-07 DIAGNOSIS — Z856 Personal history of leukemia: Secondary | ICD-10-CM | POA: Diagnosis not present

## 2016-08-07 LAB — PREPARE RBC (CROSSMATCH)

## 2016-08-07 MED ORDER — ACETAMINOPHEN 325 MG PO TABS
650.0000 mg | ORAL_TABLET | Freq: Once | ORAL | Status: AC
  Administered 2016-08-07: 650 mg via ORAL
  Filled 2016-08-07: qty 2

## 2016-08-07 MED ORDER — HEPARIN SOD (PORK) LOCK FLUSH 100 UNIT/ML IV SOLN
500.0000 [IU] | Freq: Once | INTRAVENOUS | Status: AC
  Administered 2016-08-07: 500 [IU] via INTRAVENOUS

## 2016-08-07 MED ORDER — SODIUM CHLORIDE 0.9 % IV SOLN
250.0000 mL | Freq: Once | INTRAVENOUS | Status: AC
  Administered 2016-08-07: 250 mL via INTRAVENOUS
  Filled 2016-08-07: qty 250

## 2016-08-07 MED ORDER — HEPARIN SOD (PORK) LOCK FLUSH 100 UNIT/ML IV SOLN
INTRAVENOUS | Status: AC
  Filled 2016-08-07: qty 5

## 2016-08-07 MED ORDER — DIPHENHYDRAMINE HCL 25 MG PO CAPS
25.0000 mg | ORAL_CAPSULE | Freq: Once | ORAL | Status: AC
  Administered 2016-08-07: 25 mg via ORAL
  Filled 2016-08-07: qty 1

## 2016-08-08 LAB — TYPE AND SCREEN
Blood Product Expiration Date: 201712202005
Blood Product Expiration Date: 201712202005
ISSUE DATE / TIME: 201712200937
ISSUE DATE / TIME: 201712201209
Unit Type and Rh: 600
Unit Type and Rh: 600

## 2016-08-10 ENCOUNTER — Other Ambulatory Visit: Payer: Self-pay | Admitting: Family Medicine

## 2016-08-10 MED ORDER — TAMSULOSIN HCL 0.4 MG PO CAPS: 0.8000 mg | ORAL_CAPSULE | Freq: Every day | ORAL | 1 refills | Status: DC

## 2016-08-13 ENCOUNTER — Inpatient Hospital Stay: Payer: Medicare Other

## 2016-08-13 DIAGNOSIS — R011 Cardiac murmur, unspecified: Secondary | ICD-10-CM | POA: Diagnosis not present

## 2016-08-13 DIAGNOSIS — Z79899 Other long term (current) drug therapy: Secondary | ICD-10-CM | POA: Diagnosis not present

## 2016-08-13 DIAGNOSIS — K5900 Constipation, unspecified: Secondary | ICD-10-CM | POA: Diagnosis not present

## 2016-08-13 DIAGNOSIS — Z856 Personal history of leukemia: Secondary | ICD-10-CM | POA: Diagnosis not present

## 2016-08-13 DIAGNOSIS — C9000 Multiple myeloma not having achieved remission: Secondary | ICD-10-CM | POA: Diagnosis not present

## 2016-08-13 DIAGNOSIS — E538 Deficiency of other specified B group vitamins: Secondary | ICD-10-CM | POA: Diagnosis not present

## 2016-08-13 DIAGNOSIS — Z8619 Personal history of other infectious and parasitic diseases: Secondary | ICD-10-CM | POA: Diagnosis not present

## 2016-08-13 DIAGNOSIS — K219 Gastro-esophageal reflux disease without esophagitis: Secondary | ICD-10-CM | POA: Diagnosis not present

## 2016-08-13 DIAGNOSIS — Z7982 Long term (current) use of aspirin: Secondary | ICD-10-CM | POA: Diagnosis not present

## 2016-08-13 DIAGNOSIS — Z794 Long term (current) use of insulin: Secondary | ICD-10-CM | POA: Diagnosis not present

## 2016-08-13 DIAGNOSIS — Z87891 Personal history of nicotine dependence: Secondary | ICD-10-CM | POA: Diagnosis not present

## 2016-08-13 DIAGNOSIS — C9141 Hairy cell leukemia, in remission: Secondary | ICD-10-CM

## 2016-08-13 DIAGNOSIS — Z803 Family history of malignant neoplasm of breast: Secondary | ICD-10-CM | POA: Diagnosis not present

## 2016-08-13 DIAGNOSIS — E119 Type 2 diabetes mellitus without complications: Secondary | ICD-10-CM | POA: Diagnosis not present

## 2016-08-13 DIAGNOSIS — E785 Hyperlipidemia, unspecified: Secondary | ICD-10-CM | POA: Diagnosis not present

## 2016-08-13 DIAGNOSIS — D649 Anemia, unspecified: Secondary | ICD-10-CM | POA: Diagnosis not present

## 2016-08-13 DIAGNOSIS — R5383 Other fatigue: Secondary | ICD-10-CM | POA: Diagnosis not present

## 2016-08-13 DIAGNOSIS — R0602 Shortness of breath: Secondary | ICD-10-CM | POA: Diagnosis not present

## 2016-08-13 DIAGNOSIS — I10 Essential (primary) hypertension: Secondary | ICD-10-CM | POA: Diagnosis not present

## 2016-08-13 DIAGNOSIS — Z85828 Personal history of other malignant neoplasm of skin: Secondary | ICD-10-CM | POA: Diagnosis not present

## 2016-08-13 DIAGNOSIS — N400 Enlarged prostate without lower urinary tract symptoms: Secondary | ICD-10-CM | POA: Diagnosis not present

## 2016-08-13 DIAGNOSIS — Z8669 Personal history of other diseases of the nervous system and sense organs: Secondary | ICD-10-CM | POA: Diagnosis not present

## 2016-08-13 DIAGNOSIS — Z86010 Personal history of colonic polyps: Secondary | ICD-10-CM | POA: Diagnosis not present

## 2016-08-13 LAB — CBC WITH DIFFERENTIAL/PLATELET
Basophils Absolute: 0.1 10*3/uL (ref 0–0.1)
Basophils Relative: 3 %
Eosinophils Absolute: 0.3 10*3/uL (ref 0–0.7)
Eosinophils Relative: 9 %
HCT: 29.7 % — ABNORMAL LOW (ref 40.0–52.0)
Hemoglobin: 10.3 g/dL — ABNORMAL LOW (ref 13.0–18.0)
Lymphocytes Relative: 15 %
Lymphs Abs: 0.4 10*3/uL — ABNORMAL LOW (ref 1.0–3.6)
MCH: 35.1 pg — ABNORMAL HIGH (ref 26.0–34.0)
MCHC: 34.7 g/dL (ref 32.0–36.0)
MCV: 101.4 fL — ABNORMAL HIGH (ref 80.0–100.0)
Monocytes Absolute: 0.1 10*3/uL — ABNORMAL LOW (ref 0.2–1.0)
Monocytes Relative: 3 %
Neutro Abs: 2 10*3/uL (ref 1.4–6.5)
Neutrophils Relative %: 70 %
Platelets: 67 10*3/uL — ABNORMAL LOW (ref 150–440)
RBC: 2.92 MIL/uL — ABNORMAL LOW (ref 4.40–5.90)
RDW: 20.8 % — ABNORMAL HIGH (ref 11.5–14.5)
WBC: 2.9 10*3/uL — ABNORMAL LOW (ref 3.8–10.6)

## 2016-08-13 LAB — SAMPLE TO BLOOD BANK

## 2016-08-20 ENCOUNTER — Inpatient Hospital Stay: Payer: Medicare Other | Admitting: Hematology and Oncology

## 2016-08-20 ENCOUNTER — Inpatient Hospital Stay: Payer: Medicare Other

## 2016-08-22 ENCOUNTER — Inpatient Hospital Stay: Payer: Medicare Other

## 2016-08-22 ENCOUNTER — Inpatient Hospital Stay: Payer: Medicare Other | Attending: Hematology and Oncology | Admitting: Hematology and Oncology

## 2016-08-22 ENCOUNTER — Other Ambulatory Visit: Payer: Self-pay | Admitting: *Deleted

## 2016-08-22 ENCOUNTER — Telehealth: Payer: Self-pay | Admitting: *Deleted

## 2016-08-22 VITALS — BP 119/69 | HR 89 | Temp 97.8°F | Wt 215.6 lb

## 2016-08-22 DIAGNOSIS — Z85828 Personal history of other malignant neoplasm of skin: Secondary | ICD-10-CM | POA: Diagnosis not present

## 2016-08-22 DIAGNOSIS — D649 Anemia, unspecified: Secondary | ICD-10-CM | POA: Diagnosis not present

## 2016-08-22 DIAGNOSIS — Z856 Personal history of leukemia: Secondary | ICD-10-CM | POA: Diagnosis not present

## 2016-08-22 DIAGNOSIS — R011 Cardiac murmur, unspecified: Secondary | ICD-10-CM | POA: Diagnosis not present

## 2016-08-22 DIAGNOSIS — E039 Hypothyroidism, unspecified: Secondary | ICD-10-CM | POA: Insufficient documentation

## 2016-08-22 DIAGNOSIS — E785 Hyperlipidemia, unspecified: Secondary | ICD-10-CM | POA: Insufficient documentation

## 2016-08-22 DIAGNOSIS — C9000 Multiple myeloma not having achieved remission: Secondary | ICD-10-CM | POA: Insufficient documentation

## 2016-08-22 DIAGNOSIS — Z8619 Personal history of other infectious and parasitic diseases: Secondary | ICD-10-CM

## 2016-08-22 DIAGNOSIS — D539 Nutritional anemia, unspecified: Secondary | ICD-10-CM

## 2016-08-22 DIAGNOSIS — E119 Type 2 diabetes mellitus without complications: Secondary | ICD-10-CM

## 2016-08-22 DIAGNOSIS — Z87891 Personal history of nicotine dependence: Secondary | ICD-10-CM | POA: Diagnosis not present

## 2016-08-22 DIAGNOSIS — K219 Gastro-esophageal reflux disease without esophagitis: Secondary | ICD-10-CM | POA: Diagnosis not present

## 2016-08-22 DIAGNOSIS — Z803 Family history of malignant neoplasm of breast: Secondary | ICD-10-CM

## 2016-08-22 DIAGNOSIS — Z9221 Personal history of antineoplastic chemotherapy: Secondary | ICD-10-CM | POA: Diagnosis not present

## 2016-08-22 DIAGNOSIS — Z7984 Long term (current) use of oral hypoglycemic drugs: Secondary | ICD-10-CM | POA: Insufficient documentation

## 2016-08-22 DIAGNOSIS — I10 Essential (primary) hypertension: Secondary | ICD-10-CM | POA: Insufficient documentation

## 2016-08-22 DIAGNOSIS — R5383 Other fatigue: Secondary | ICD-10-CM | POA: Diagnosis not present

## 2016-08-22 DIAGNOSIS — Z7982 Long term (current) use of aspirin: Secondary | ICD-10-CM | POA: Insufficient documentation

## 2016-08-22 DIAGNOSIS — D46Z Other myelodysplastic syndromes: Secondary | ICD-10-CM

## 2016-08-22 DIAGNOSIS — Z79899 Other long term (current) drug therapy: Secondary | ICD-10-CM | POA: Diagnosis not present

## 2016-08-22 DIAGNOSIS — C9141 Hairy cell leukemia, in remission: Secondary | ICD-10-CM

## 2016-08-22 DIAGNOSIS — Z86010 Personal history of colonic polyps: Secondary | ICD-10-CM | POA: Insufficient documentation

## 2016-08-22 DIAGNOSIS — N400 Enlarged prostate without lower urinary tract symptoms: Secondary | ICD-10-CM | POA: Insufficient documentation

## 2016-08-22 DIAGNOSIS — Z8701 Personal history of pneumonia (recurrent): Secondary | ICD-10-CM | POA: Insufficient documentation

## 2016-08-22 DIAGNOSIS — Z853 Personal history of malignant neoplasm of breast: Secondary | ICD-10-CM

## 2016-08-22 LAB — CBC WITH DIFFERENTIAL/PLATELET
Basophils Absolute: 0 10*3/uL (ref 0–0.1)
Basophils Relative: 1 %
Eosinophils Absolute: 0.1 10*3/uL (ref 0–0.7)
Eosinophils Relative: 4 %
HCT: 26.9 % — ABNORMAL LOW (ref 40.0–52.0)
Hemoglobin: 9.4 g/dL — ABNORMAL LOW (ref 13.0–18.0)
Lymphocytes Relative: 29 %
Lymphs Abs: 0.5 10*3/uL — ABNORMAL LOW (ref 1.0–3.6)
MCH: 35.3 pg — ABNORMAL HIGH (ref 26.0–34.0)
MCHC: 34.8 g/dL (ref 32.0–36.0)
MCV: 101.6 fL — ABNORMAL HIGH (ref 80.0–100.0)
Monocytes Absolute: 0.2 10*3/uL (ref 0.2–1.0)
Monocytes Relative: 9 %
Neutro Abs: 1.1 10*3/uL — ABNORMAL LOW (ref 1.4–6.5)
Neutrophils Relative %: 57 %
Platelets: 96 10*3/uL — ABNORMAL LOW (ref 150–440)
RBC: 2.65 MIL/uL — ABNORMAL LOW (ref 4.40–5.90)
RDW: 21.1 % — ABNORMAL HIGH (ref 11.5–14.5)
WBC: 1.9 10*3/uL — ABNORMAL LOW (ref 3.8–10.6)

## 2016-08-22 LAB — COMPREHENSIVE METABOLIC PANEL
ALT: 20 U/L (ref 17–63)
AST: 25 U/L (ref 15–41)
Albumin: 3.8 g/dL (ref 3.5–5.0)
Alkaline Phosphatase: 65 U/L (ref 38–126)
Anion gap: 6 (ref 5–15)
BUN: 35 mg/dL — ABNORMAL HIGH (ref 6–20)
CO2: 25 mmol/L (ref 22–32)
Calcium: 8.9 mg/dL (ref 8.9–10.3)
Chloride: 103 mmol/L (ref 101–111)
Creatinine, Ser: 1.46 mg/dL — ABNORMAL HIGH (ref 0.61–1.24)
GFR calc Af Amer: 51 mL/min — ABNORMAL LOW (ref >60)
GFR calc non Af Amer: 44 mL/min — ABNORMAL LOW (ref >60)
Glucose, Bld: 404 mg/dL — ABNORMAL HIGH (ref 65–99)
Potassium: 4.6 mmol/L (ref 3.5–5.1)
Sodium: 134 mmol/L — ABNORMAL LOW (ref 135–145)
Total Bilirubin: 0.8 mg/dL (ref 0.3–1.2)
Total Protein: 7.4 g/dL (ref 6.5–8.1)

## 2016-08-22 LAB — SAMPLE TO BLOOD BANK

## 2016-08-22 LAB — VITAMIN B12: Vitamin B-12: 1262 pg/mL — ABNORMAL HIGH (ref 180–914)

## 2016-08-22 LAB — TSH: TSH: 1.396 u[IU]/mL (ref 0.350–4.500)

## 2016-08-22 LAB — FOLATE: Folate: 41 ng/mL (ref >5.9)

## 2016-08-22 MED ORDER — LENALIDOMIDE 5 MG PO CAPS: ORAL_CAPSULE | ORAL | 2 refills | Status: DC

## 2016-08-22 NOTE — Progress Notes (Signed)
[facility] day:  08/22/2016   Chief Complaint: Alex Smith is a 82 y.o. male with a myelodysplastic marrow, smoldering myeloma, and a history of hairy cell leukemia who is seen for 2 week assessment off Revlimid.  HPI:  The patient was last seen in the medical oncology clinic on 08/02/2016.  At that time, counts remained low off Revlimid.  CBC revealed a hematocrit of 22.9, hemoglobin 7.8, MCV 107.1, platelets 66,000, WBC 1400 with an ANC of 700.  He received GCSF 480 mcg on 08/02/2016 and 08/05/2016.  CBC on 08/15/2016 revealed a hematocrit of 21.9, hemoglobin 7.4, platelets 56,000, WBC 1400 with an ANC of 700. He received 2 units of washed RBCs on 08/07/2016.  CBC on 08/13/2016 revealed a hematocrit of 29.7, hemoglobin 10.3, MCV 101.4, platelets 67,000, and WBC 2900 with an ANC of 2000.  Symptomatically, he denies any new complaints.  He remains fatigued.  He denies any fevers or interval infections.  He denies any bruising or bleeding.  He has taken B12 for the past 2 years.   Past Medical History:  Diagnosis Date  . Anemia   . B12 deficiency   . Basal cell carcinoma of face   . BPH (benign prostatic hypertrophy)    followed by urology, discharged (Dr. Bernardo Heater)  . CAP (community acquired pneumonia) 02/15/2015  . Colon polyps   . Diverticulosis   . GERD (gastroesophageal reflux disease)   . Hairy cell leukemia ([place]) 2006   recurrent, seizure on rituxan, now on cladribine ([place])  . History of pneumonia 2000's   "once" (07/07/2012)  . History of shingles   . HLD (hyperlipidemia)   . Hypertension   . Pneumonia   . Shortness of breath dyspnea   . Systolic murmur 40/97/3532  . Type 2 diabetes, controlled, with retinopathy ([place])     Past Surgical History:  Procedure Laterality Date  . [place] VITRECTOMY WITH 20 GAUGE MVR PORT FOR MACULAR HOLE  07/07/2012   Procedure: 25 GAUGE PARS PLANA VITRECTOMY WITH 20 GAUGE MVR  PORT FOR MACULAR HOLE;  Surgeon: Hayden Pedro, MD;  Location: [facility];  Service: Ophthalmology;  Laterality: Left;  . BONE MARROW BIOPSY  2016  . CARDIOVASCULAR STRESS TEST  2013   treadmill - no evidence ischemia, EF 61%  . CATARACT EXTRACTION W/ INTRAOCULAR LENS  IMPLANT, BILATERAL  ~ 2010  . COLONOSCOPY  2014   [place] WNL no rpt needed, h/o polyps  . EYE SURGERY Left 06/2012   laser surgery  . GAS INSERTION  07/07/2012   Procedure: INSERTION OF GAS;  Surgeon: Hayden Pedro, MD;  Location: [facility];  Service: Ophthalmology;  Laterality: Left;  [id number]  . PERIPHERAL VASCULAR CATHETERIZATION N/A 02/23/2015   Procedure: Glori [place] Cath Insertion;  Surgeon: Algernon Huxley, MD;  Location: [facility];  Service: Cardiovascular;  Laterality: N/A;  . SERUM PATCH  07/07/2012   Procedure: SERUM PATCH;  Surgeon: Hayden Pedro, MD;  Location: [facility];  Service: Ophthalmology;  Laterality: Left;  . SKIN CANCER EXCISION     "all over my face" (07/07/2012)    Family History  Problem Relation Age of Onset  . Dementia Mother   . Heart failure Father 79  . Cancer Sister     breast  . Diabetes Paternal Uncle   . Diabetes Paternal Aunt   . CAD Brother 73    MI  . Stroke Neg Hx  Social History:  reports that he quit smoking about 47 years ago. His smoking use included Cigarettes. He has a 50.00 pack-year smoking history. He has never used smokeless tobacco. He reports that he drinks alcohol. He reports that he does not use drugs.  He works at [employer] 20-32 hours/week.  The patient 's wife has had 3 hip operations.  She underwent hip replacement 03/06/2016.  She recently hurt her shoulder.  He takes care of his wife.  He states that they either eat out or bring food in.  His best contact number is his cell: [phone].  He is alone today.  Allergies:  Allergies  Allergen Reactions  . Rituximab Rash    Chest tightness  . Blood-Group Specific Substance Other (See Comments)    Had a post  transfusion reaction of red blood cells; NOW REQUIRES WASHED BLOOD CELLS  . Primaxin [Imipenem] Other (See Comments)    Possible allergy  . Sulfa Antibiotics Itching and Rash  Possible allergy to Zoloft, allopurinol, and a chemotherapy medication.  He tolerates Dapsone.  Current Medications: Current Outpatient Prescriptions  Medication Sig Dispense Refill  . acyclovir (ZOVIRAX) 400 MG tablet TAKE TWO TABLETS TWICE DAILY 120 tablet 3  . aspirin EC 81 MG tablet Take 81 mg by mouth daily.    Marland Kitchen dexamethasone (DECADRON) 4 MG tablet     . glucose blood (ACCU-CHEK AVIVA PLUS) test strip Use to check sugar twice daily Dx:E11.9 200 each 3  . HUMULIN R 100 UNIT/ML injection INJECT 10 UNITS SUBCUTANEOUSLY TWICE DAILY BEFORE MEALS 10 mL 6  . hydrochlorothiazide (HYDRODIURIL) 25 MG tablet TAKE ONE TABLET BY MOUTH EVERY DAY 30 tablet 3  . insulin NPH Human (HUMULIN N) 100 UNIT/ML injection Inject 0.39 mLs (39 Units total) into the skin 2 (two) times daily before a meal.    . lidocaine-prilocaine (EMLA) cream Apply 1 application topically as needed. 30 g 3  . lisinopril (PRINIVIL,ZESTRIL) 20 MG tablet TAKE ONE TABLET TWICE DAILY 60 tablet 6  . metFORMIN (GLUCOPHAGE) 1000 MG tablet TAKE ONE TABLET BY MOUTH TWICE DAILY AS DIRECTED 180 tablet 3  . MICROLET LANCETS MISC USE AS DIRECTED 200 each 3  . pravastatin (PRAVACHOL) 20 MG tablet TAKE ONE TABLET AT BEDTIME 30 tablet 3  . ranitidine (ZANTAC) 150 MG tablet Take 1 tablet (150 mg total) by mouth 2 (two) times daily as needed. 180 tablet 3  . tamsulosin (FLOMAX) 0.4 MG CAPS capsule Take 2 capsules (0.8 mg total) by mouth daily. 60 capsule 1  . temazepam (RESTORIL) 7.5 MG capsule Take 7.5 mg by mouth once.     . traZODone (DESYREL) 50 MG tablet Take 0.5-1 tablets (25-50 mg total) by mouth at bedtime as needed for sleep. 30 tablet 3  . vitamin B-12 (CYANOCOBALAMIN) 1000 MCG tablet Take 1,000 mcg by mouth daily.    . vitamin E 400 UNIT capsule Take 200 Units by  mouth daily.     Marland Kitchen lenalidomide (REVLIMID) 10 MG capsule 10 mg a day for 21 days then 7 days off (Patient not taking: Reported on 08/22/2016) 21 capsule 2   No current facility-administered medications for this visit.    Facility-Administered Medications Ordered in Other Visits  Medication Dose Route Frequency Provider Last Rate Last Dose  . heparin lock flush 100 unit/mL  500 Units Intravenous Once Lequita Asal, MD      . sodium chloride 0.9 % injection 10 mL  10 mL Intravenous PRN Lequita Asal, MD  10 mL at 03/03/15 0903  . sodium chloride 0.9 % injection 10 mL  10 mL Intracatheter PRN Lequita Asal, MD   10 mL at 03/10/15 1410   Review of Systems:  GENERAL:  Chronic fatigue.  No fevers or sweats.  Weight down 6 pounds. PERFORMANCE STATUS (ECOG):  1 HEENT: No visual changes, runny nose, sore throat. Pulmonary:  Shortness of breath on exertion (chronic).  No cough.  No hemoptysis. Cardiac:  No chest pain, palpitations, orthopnea, or PND. GI:  Decreased appetite.  No nausea, vomiting, diarrhea, constipation, melena or hematochezia.  Drinks Boost. GU:  No urgency, frequency, dysuria, or hematuria. On Flomax. Musculoskeletal:  No back pain.  No joint pain.  No muscle tenderness. Extremities:  No pain or swelling. Skin:  No rashes or skin changes. Neuro:  No headache, numbness or weakness, balance or coordination issues. Endocrine:  Diabetes.  No thyroid issues, hot flashes or night sweats. Psych:  No mood changes, depression or anxiety.  Pain:  No focal pain. Review of systems:  All other systems reviewed and found to be negative.   Physical Exam: Blood pressure 119/69, pulse 89, temperature 97.8 F (36.6 C), temperature source Tympanic, weight 215 lb 9.8 oz (97.8 kg).  GENERAL:  Well developed, well nourished, gentleman sitting comfortably in the exam room in no acute distress. MENTAL STATUS:  Alert and oriented to person, place and time. HEAD:  Pearline Cables white hair.   Normocephalic, atraumatic, face symmetric, no Cushingoid features. EYES:  Glasses. Blue eyes.  Pupils equal round and reactive to light and accomodation.  No conjunctivitis or scleral icterus. ENT:  Oropharynx clear without lesion.  Tongue normal. Mucous membranes moist.  RESPIRATORY:  Clear to auscultation without rales, wheezes or rhonchi. CARDIOVASCULAR:  Regular rate and rhythm without murmur, rub or gallop. ABDOMEN:  Soft, non-tender, with active bowel sounds, and no hepatosplenomegaly.  No masses. SKIN:  No rashes or ulcers. EXTREMITIES: No edema, no skin discoloration or tenderness.  No palpable cords. LYMPH NODES: No palpable cervical, supraclavicular, axillary or inguinal adenopathy  NEUROLOGICAL: Unremarkable. PSYCH:  Appropriate.    Appointment on 08/22/2016  Component Date Value Ref Range Status  . WBC 08/22/2016 1.9* 3.8 - 10.6 K/uL Final  . RBC 08/22/2016 2.65* 4.40 - 5.90 MIL/uL Final  . Hemoglobin 08/22/2016 9.4* 13.0 - 18.0 g/dL Final  . HCT 08/22/2016 26.9* 40.0 - 52.0 % Final  . MCV 08/22/2016 101.6* 80.0 - 100.0 fL Final  . MCH 08/22/2016 35.3* 26.0 - 34.0 pg Final  . MCHC 08/22/2016 34.8  32.0 - 36.0 g/dL Final  . RDW 08/22/2016 21.1* 11.5 - 14.5 % Final  . Platelets 08/22/2016 96* 150 - 440 K/uL Final  . Neutrophils Relative % 08/22/2016 57  % Final  . Neutro Abs 08/22/2016 1.1* 1.4 - 6.5 K/uL Final  . Lymphocytes Relative 08/22/2016 29  % Final  . Lymphs Abs 08/22/2016 0.5* 1.0 - 3.6 K/uL Final  . Monocytes Relative 08/22/2016 9  % Final  . Monocytes Absolute 08/22/2016 0.2  0.2 - 1.0 K/uL Final  . Eosinophils Relative 08/22/2016 4  % Final  . Eosinophils Absolute 08/22/2016 0.1  0 - 0.7 K/uL Final  . Basophils Relative 08/22/2016 1  % Final  . Basophils Absolute 08/22/2016 0.0  0 - 0.1 K/uL Final  . Blood Bank Specimen 08/22/2016 SAMPLE AVAILABLE FOR TESTING   Final  . Sample Expiration 08/22/2016 08/25/2016   Final  . Sodium 08/22/2016 134* 135 - 145  mmol/L Final  .  Potassium 08/22/2016 4.6  3.5 - 5.1 mmol/L Final  . Chloride 08/22/2016 103  101 - 111 mmol/L Final  . CO2 08/22/2016 25  22 - 32 mmol/L Final  . Glucose, Bld 08/22/2016 404* 65 - 99 mg/dL Final  . BUN 08/22/2016 35* 6 - 20 mg/dL Final  . Creatinine, Ser 08/22/2016 1.46* 0.61 - 1.24 mg/dL Final  . Calcium 08/22/2016 8.9  8.9 - 10.3 mg/dL Final  . Total Protein 08/22/2016 7.4  6.5 - 8.1 g/dL Final  . Albumin 08/22/2016 3.8  3.5 - 5.0 g/dL Final  . AST 08/22/2016 25  15 - 41 U/L Final  . ALT 08/22/2016 20  17 - 63 U/L Final  . Alkaline Phosphatase 08/22/2016 65  38 - 126 U/L Final  . Total Bilirubin 08/22/2016 0.8  0.3 - 1.2 mg/dL Final  . GFR calc non Af Amer 08/22/2016 44* >60 mL/min Final  . GFR calc Af Amer 08/22/2016 51* >60 mL/min Final   Comment: (NOTE) The eGFR has been calculated using the CKD EPI equation. This calculation has not been validated in all clinical situations. eGFR's persistently <60 mL/min signify possible Chronic Kidney Disease.   Georgiann Hahn gap 08/22/2016 6  5 - 15 Final    Assessment:  ASIEL CHROSTOWSKI is a 82 y.o. 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  He was diagnosed with hairy cell leukemia in 2006.  He received cladribine 11/06-11/05/2005.  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   Bone marrow aspirate and biopsy on 02/07/2015 revealed a extensive marrow involvement by recurrent hairy cell leukemia (approximately 80% of cells in the core). There was a monoclonal plasma cell infiltrate (approximately 10%), compatible with a plasma cell neoplasm. Marrow was hypercellular (40-50%) with markedly diminished residual trilineage hematopoiesis and mild multilineage dyspoiesis.  Peripheral smear revealed leukopenia (WBC 1900) with 2% atypical lymphoid cells compatible with hairy cell leukemia. There was moderate anemia with anisopoikilocytosis. FISH studies revealed an abnormal myeloma panel with CCND1/IGH  translocation t (11;14) and loss of MAF/16q.  FISH studies for MDS were negative.  Cytogenetics were normal (46, XY).  He has an unclear allergy history.  He may be allergic to Primaxin, Zoloft, allopurinol, and a chemotherapy medication.  Reaction occurred in 06/2005.  He is allergic to sulfa.  He is not allergic to dapsone.  He requires washed RBCs.  He is on prophylactic acyclovir.  Dapsone and voriconazole were discontinued.  He has a history of shingles prior to prophylactic acyclovir.  He is 17 months status post cladribine (began 03/03/2015).  His counts plateaued on 07/25/2015 and have subsequently decreased.  Labs on 07/25/2015 revealed a normal B12 (894), folate (60.5), and TSH (2.562).  Ferritin was 52.  He is on oral B12.  Bone marrow on 11/03/2015 revealed persistent plasma cell neoplasm, now with mildy atypical monoclonal plasma cells estimated at 20-30%.   There was no morphologic evidence of residual hairy cell leukemia.  There was a minute population (0.03%) with hairy cell immunophenotype detected by flow.  Marrow was normocellular for age with relative erythroid hyperplasia, relative myeloid hypoplasia, mild dyspoiesis, adequate megakaryocytes and no increased blasts.  There was no significant increase in marrow reticulin fibers.  Iron was present.  FISH studies for MDS were negative.  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  Cytogenetics were normal (46, XY).  Bone marrow on 03/13/2016 was normocellular to marginally hypercellular for age (20-40%) with relative erythroid hyperplasia  and mild dyserythropoiesis, decreased granulopoiesis and adequate megakaryocytes.  There was a persistent 15-20% plasma cell neoplasm.  There was no overt increase in reticulin fibrosis.  Storage iron was present.  Flow cytometry revealed abnormal/monocytic plasma cell population (3% sample), consistent with a plasma cell neoplastic process.  Cytogenetics were  normal (46, XY).  FISH studies for myeloma revealed a CCND1/IGH translocation, t(11;14) in 53% of nuclei.  SPEP on 11/17/2015 revealed a 1.3 gm/dL monoclonal spike (1.4 gm/dL on 07/25/2015 and 1.0 on 02/03/2015).  Kappa free light chains were 50.51, lambda free light chains were 10.48 with a ratio of 4.82 (ratio 3.68 on 07/25/2015).  Beta2-microglobulin was 2.5.  24 hour urine on 11/21/2015 revealed 234.8 mg protein in 24 hours (no monoclonal protein).  Bone survey on 11/24/2015 revealed no lytic lesions.  Epo level was 329.6 on 03/29/2016.  He began a trial of Procrit.  He received 10,000 units of Procrit weekly (04/26/2016 - 06/14/2016).  Hemoglobin ranged between 8.4 and 8.9 without trend.    He is s/p 1 cycle of Revlimid and Decadron (06/29/2016 - 07/19/2016).  Cycle #1 was complicated by anemia and neutropenia.  He has received 3 units of PRBCs (1 unit:07/22/2016 and 2 units: 08/07/2016).  He received 6 days of GCSF (last 08/05/2016).  He is on aspirin 81 mg a day prophylaxis.  PET scan on 03/14/2016 revealed no evidence of hypermetabolic lymphadenopathy or focal skeletal lesions to suggest marrow or cortical bone involvement.  Symptomatically, he has chronic fatigue.  Exam is stable.  Hematocrit is 26.9 with a hemoglobin of 9.4.  Platelet count has improved (96,000).  ANC is 1,100.  Plan: 1.  Labs today:  CBC with diff, CMP, B12, folate, TSH, hold tube. 2.  Order Revlimid 5 mg a day (3 weeks on and 1 week off). 3.  Patient to call when Revlimid arrives to start. 4.  RTC weekly x 3 for labs (CBC with diff, hold tube). 5.  RTC in 4 weeks for MD assessment and labs (CBC with dif, CMP, Mg, SPEP hold tube).   Lequita Asal, MD  08/22/2016, 4:37 PM

## 2016-08-23 NOTE — Telephone Encounter (Signed)
Entered in error

## 2016-08-28 ENCOUNTER — Other Ambulatory Visit: Payer: Self-pay | Admitting: Family Medicine

## 2016-08-29 ENCOUNTER — Inpatient Hospital Stay: Payer: Medicare Other

## 2016-08-29 DIAGNOSIS — Z856 Personal history of leukemia: Secondary | ICD-10-CM | POA: Diagnosis not present

## 2016-08-29 DIAGNOSIS — E119 Type 2 diabetes mellitus without complications: Secondary | ICD-10-CM | POA: Diagnosis not present

## 2016-08-29 DIAGNOSIS — K219 Gastro-esophageal reflux disease without esophagitis: Secondary | ICD-10-CM | POA: Diagnosis not present

## 2016-08-29 DIAGNOSIS — C9000 Multiple myeloma not having achieved remission: Secondary | ICD-10-CM

## 2016-08-29 DIAGNOSIS — Z85828 Personal history of other malignant neoplasm of skin: Secondary | ICD-10-CM | POA: Diagnosis not present

## 2016-08-29 DIAGNOSIS — I10 Essential (primary) hypertension: Secondary | ICD-10-CM | POA: Diagnosis not present

## 2016-08-29 DIAGNOSIS — Z87891 Personal history of nicotine dependence: Secondary | ICD-10-CM | POA: Diagnosis not present

## 2016-08-29 DIAGNOSIS — Z7982 Long term (current) use of aspirin: Secondary | ICD-10-CM | POA: Diagnosis not present

## 2016-08-29 DIAGNOSIS — Z9221 Personal history of antineoplastic chemotherapy: Secondary | ICD-10-CM | POA: Diagnosis not present

## 2016-08-29 DIAGNOSIS — Z86010 Personal history of colonic polyps: Secondary | ICD-10-CM | POA: Diagnosis not present

## 2016-08-29 DIAGNOSIS — Z7984 Long term (current) use of oral hypoglycemic drugs: Secondary | ICD-10-CM | POA: Diagnosis not present

## 2016-08-29 DIAGNOSIS — R5383 Other fatigue: Secondary | ICD-10-CM | POA: Diagnosis not present

## 2016-08-29 DIAGNOSIS — Z803 Family history of malignant neoplasm of breast: Secondary | ICD-10-CM | POA: Diagnosis not present

## 2016-08-29 DIAGNOSIS — Z8619 Personal history of other infectious and parasitic diseases: Secondary | ICD-10-CM | POA: Diagnosis not present

## 2016-08-29 DIAGNOSIS — E785 Hyperlipidemia, unspecified: Secondary | ICD-10-CM | POA: Diagnosis not present

## 2016-08-29 DIAGNOSIS — N400 Enlarged prostate without lower urinary tract symptoms: Secondary | ICD-10-CM | POA: Diagnosis not present

## 2016-08-29 DIAGNOSIS — Z79899 Other long term (current) drug therapy: Secondary | ICD-10-CM | POA: Diagnosis not present

## 2016-08-29 DIAGNOSIS — R011 Cardiac murmur, unspecified: Secondary | ICD-10-CM | POA: Diagnosis not present

## 2016-08-29 DIAGNOSIS — D649 Anemia, unspecified: Secondary | ICD-10-CM | POA: Diagnosis not present

## 2016-08-29 DIAGNOSIS — E039 Hypothyroidism, unspecified: Secondary | ICD-10-CM | POA: Diagnosis not present

## 2016-08-29 LAB — CBC WITH DIFFERENTIAL/PLATELET
Basophils Absolute: 0 10*3/uL (ref 0–0.1)
Basophils Relative: 1 %
Eosinophils Absolute: 0.1 10*3/uL (ref 0–0.7)
Eosinophils Relative: 3 %
HCT: 25.3 % — ABNORMAL LOW (ref 40.0–52.0)
Hemoglobin: 8.8 g/dL — ABNORMAL LOW (ref 13.0–18.0)
Lymphocytes Relative: 22 %
Lymphs Abs: 0.6 10*3/uL — ABNORMAL LOW (ref 1.0–3.6)
MCH: 36.1 pg — ABNORMAL HIGH (ref 26.0–34.0)
MCHC: 34.8 g/dL (ref 32.0–36.0)
MCV: 103.7 fL — ABNORMAL HIGH (ref 80.0–100.0)
Monocytes Absolute: 0.2 10*3/uL (ref 0.2–1.0)
Monocytes Relative: 7 %
Neutro Abs: 1.8 10*3/uL (ref 1.4–6.5)
Neutrophils Relative %: 67 %
Platelets: 93 10*3/uL — ABNORMAL LOW (ref 150–440)
RBC: 2.44 MIL/uL — ABNORMAL LOW (ref 4.40–5.90)
RDW: 22.1 % — ABNORMAL HIGH (ref 11.5–14.5)
WBC: 2.7 10*3/uL — ABNORMAL LOW (ref 3.8–10.6)

## 2016-08-29 LAB — SAMPLE TO BLOOD BANK

## 2016-09-04 ENCOUNTER — Telehealth: Payer: Self-pay | Admitting: *Deleted

## 2016-09-04 NOTE — Telephone Encounter (Signed)
Called patient to inform him that the patient assistance program through [place] expired after the first of the year.  RX faxed to [facility], pt instructed to answer the call from [place] for the pt survey and that we would let him know when we heard back from [facility], voiced understanding.

## 2016-09-05 ENCOUNTER — Inpatient Hospital Stay: Payer: Medicare Other

## 2016-09-06 ENCOUNTER — Inpatient Hospital Stay: Payer: Medicare Other

## 2016-09-12 ENCOUNTER — Inpatient Hospital Stay: Payer: Medicare Other

## 2016-09-12 ENCOUNTER — Other Ambulatory Visit: Payer: Self-pay | Admitting: *Deleted

## 2016-09-12 DIAGNOSIS — C9000 Multiple myeloma not having achieved remission: Secondary | ICD-10-CM | POA: Diagnosis not present

## 2016-09-12 DIAGNOSIS — E119 Type 2 diabetes mellitus without complications: Secondary | ICD-10-CM | POA: Diagnosis not present

## 2016-09-12 DIAGNOSIS — Z7982 Long term (current) use of aspirin: Secondary | ICD-10-CM | POA: Diagnosis not present

## 2016-09-12 DIAGNOSIS — E039 Hypothyroidism, unspecified: Secondary | ICD-10-CM | POA: Diagnosis not present

## 2016-09-12 DIAGNOSIS — R011 Cardiac murmur, unspecified: Secondary | ICD-10-CM | POA: Diagnosis not present

## 2016-09-12 DIAGNOSIS — D649 Anemia, unspecified: Secondary | ICD-10-CM | POA: Diagnosis not present

## 2016-09-12 DIAGNOSIS — Z7984 Long term (current) use of oral hypoglycemic drugs: Secondary | ICD-10-CM | POA: Diagnosis not present

## 2016-09-12 DIAGNOSIS — N400 Enlarged prostate without lower urinary tract symptoms: Secondary | ICD-10-CM | POA: Diagnosis not present

## 2016-09-12 DIAGNOSIS — Z9221 Personal history of antineoplastic chemotherapy: Secondary | ICD-10-CM | POA: Diagnosis not present

## 2016-09-12 DIAGNOSIS — I10 Essential (primary) hypertension: Secondary | ICD-10-CM | POA: Diagnosis not present

## 2016-09-12 DIAGNOSIS — Z79899 Other long term (current) drug therapy: Secondary | ICD-10-CM | POA: Diagnosis not present

## 2016-09-12 DIAGNOSIS — K219 Gastro-esophageal reflux disease without esophagitis: Secondary | ICD-10-CM | POA: Diagnosis not present

## 2016-09-12 DIAGNOSIS — R5383 Other fatigue: Secondary | ICD-10-CM | POA: Diagnosis not present

## 2016-09-12 DIAGNOSIS — Z87891 Personal history of nicotine dependence: Secondary | ICD-10-CM | POA: Diagnosis not present

## 2016-09-12 DIAGNOSIS — Z856 Personal history of leukemia: Secondary | ICD-10-CM | POA: Diagnosis not present

## 2016-09-12 DIAGNOSIS — Z803 Family history of malignant neoplasm of breast: Secondary | ICD-10-CM | POA: Diagnosis not present

## 2016-09-12 DIAGNOSIS — Z95828 Presence of other vascular implants and grafts: Secondary | ICD-10-CM

## 2016-09-12 DIAGNOSIS — Z85828 Personal history of other malignant neoplasm of skin: Secondary | ICD-10-CM | POA: Diagnosis not present

## 2016-09-12 DIAGNOSIS — Z8619 Personal history of other infectious and parasitic diseases: Secondary | ICD-10-CM | POA: Diagnosis not present

## 2016-09-12 DIAGNOSIS — E785 Hyperlipidemia, unspecified: Secondary | ICD-10-CM | POA: Diagnosis not present

## 2016-09-12 DIAGNOSIS — Z86010 Personal history of colonic polyps: Secondary | ICD-10-CM | POA: Diagnosis not present

## 2016-09-12 LAB — CBC WITH DIFFERENTIAL/PLATELET
Basophils Absolute: 0 10*3/uL (ref 0–0.1)
Basophils Relative: 1 %
Eosinophils Absolute: 0.1 10*3/uL (ref 0–0.7)
Eosinophils Relative: 3 %
HCT: 24.4 % — ABNORMAL LOW (ref 40.0–52.0)
Hemoglobin: 8.5 g/dL — ABNORMAL LOW (ref 13.0–18.0)
Lymphocytes Relative: 18 %
Lymphs Abs: 0.5 10*3/uL — ABNORMAL LOW (ref 1.0–3.6)
MCH: 36.7 pg — ABNORMAL HIGH (ref 26.0–34.0)
MCHC: 35.1 g/dL (ref 32.0–36.0)
MCV: 104.8 fL — ABNORMAL HIGH (ref 80.0–100.0)
Monocytes Absolute: 0.2 10*3/uL (ref 0.2–1.0)
Monocytes Relative: 8 %
Neutro Abs: 2.1 10*3/uL (ref 1.4–6.5)
Neutrophils Relative %: 70 %
Platelets: 85 10*3/uL — ABNORMAL LOW (ref 150–440)
RBC: 2.32 MIL/uL — ABNORMAL LOW (ref 4.40–5.90)
RDW: 21.7 % — ABNORMAL HIGH (ref 11.5–14.5)
WBC: 2.9 10*3/uL — ABNORMAL LOW (ref 3.8–10.6)

## 2016-09-12 LAB — SAMPLE TO BLOOD BANK

## 2016-09-12 MED ORDER — HEPARIN SOD (PORK) LOCK FLUSH 100 UNIT/ML IV SOLN
500.0000 [IU] | Freq: Once | INTRAVENOUS | Status: AC
  Administered 2016-09-12: 500 [IU] via INTRAVENOUS

## 2016-09-12 MED ORDER — DEXAMETHASONE 4 MG PO TABS: 20.0000 mg | ORAL_TABLET | ORAL | 1 refills | Status: DC

## 2016-09-12 MED ORDER — SODIUM CHLORIDE 0.9% FLUSH
10.0000 mL | INTRAVENOUS | Status: DC | PRN
  Administered 2016-09-12: 10 mL via INTRAVENOUS
  Filled 2016-09-12: qty 10

## 2016-09-13 ENCOUNTER — Telehealth: Payer: Self-pay | Admitting: *Deleted

## 2016-09-13 NOTE — Telephone Encounter (Signed)
Called and left message with pt and spoke with wife about refill on decadron [facility] and to take it once weekly with revlimid, voiced understanding.

## 2016-09-19 ENCOUNTER — Inpatient Hospital Stay: Payer: Medicare Other

## 2016-09-19 ENCOUNTER — Inpatient Hospital Stay: Payer: Medicare Other | Admitting: Hematology and Oncology

## 2016-09-20 ENCOUNTER — Inpatient Hospital Stay (HOSPITAL_BASED_OUTPATIENT_CLINIC_OR_DEPARTMENT_OTHER): Payer: Medicare Other | Admitting: Hematology and Oncology

## 2016-09-20 ENCOUNTER — Inpatient Hospital Stay: Payer: Medicare Other | Attending: Hematology and Oncology

## 2016-09-20 ENCOUNTER — Other Ambulatory Visit: Payer: Self-pay

## 2016-09-20 VITALS — BP 151/68 | HR 82 | Temp 97.4°F | Resp 18 | Wt 220.0 lb

## 2016-09-20 DIAGNOSIS — Z86010 Personal history of colonic polyps: Secondary | ICD-10-CM | POA: Insufficient documentation

## 2016-09-20 DIAGNOSIS — Z85828 Personal history of other malignant neoplasm of skin: Secondary | ICD-10-CM | POA: Diagnosis not present

## 2016-09-20 DIAGNOSIS — C9000 Multiple myeloma not having achieved remission: Secondary | ICD-10-CM

## 2016-09-20 DIAGNOSIS — D649 Anemia, unspecified: Secondary | ICD-10-CM

## 2016-09-20 DIAGNOSIS — E119 Type 2 diabetes mellitus without complications: Secondary | ICD-10-CM | POA: Diagnosis not present

## 2016-09-20 DIAGNOSIS — Z803 Family history of malignant neoplasm of breast: Secondary | ICD-10-CM

## 2016-09-20 DIAGNOSIS — Z8719 Personal history of other diseases of the digestive system: Secondary | ICD-10-CM | POA: Diagnosis not present

## 2016-09-20 DIAGNOSIS — Z8701 Personal history of pneumonia (recurrent): Secondary | ICD-10-CM | POA: Diagnosis not present

## 2016-09-20 DIAGNOSIS — K219 Gastro-esophageal reflux disease without esophagitis: Secondary | ICD-10-CM | POA: Diagnosis not present

## 2016-09-20 DIAGNOSIS — Z794 Long term (current) use of insulin: Secondary | ICD-10-CM | POA: Insufficient documentation

## 2016-09-20 DIAGNOSIS — R011 Cardiac murmur, unspecified: Secondary | ICD-10-CM

## 2016-09-20 DIAGNOSIS — D46Z Other myelodysplastic syndromes: Secondary | ICD-10-CM | POA: Insufficient documentation

## 2016-09-20 DIAGNOSIS — Z79899 Other long term (current) drug therapy: Secondary | ICD-10-CM | POA: Diagnosis not present

## 2016-09-20 DIAGNOSIS — R0602 Shortness of breath: Secondary | ICD-10-CM | POA: Insufficient documentation

## 2016-09-20 DIAGNOSIS — I10 Essential (primary) hypertension: Secondary | ICD-10-CM

## 2016-09-20 DIAGNOSIS — Z87891 Personal history of nicotine dependence: Secondary | ICD-10-CM | POA: Insufficient documentation

## 2016-09-20 DIAGNOSIS — C9141 Hairy cell leukemia, in remission: Secondary | ICD-10-CM

## 2016-09-20 DIAGNOSIS — N400 Enlarged prostate without lower urinary tract symptoms: Secondary | ICD-10-CM | POA: Insufficient documentation

## 2016-09-20 DIAGNOSIS — D72819 Decreased white blood cell count, unspecified: Secondary | ICD-10-CM | POA: Diagnosis not present

## 2016-09-20 DIAGNOSIS — Z7982 Long term (current) use of aspirin: Secondary | ICD-10-CM | POA: Insufficient documentation

## 2016-09-20 DIAGNOSIS — Z8619 Personal history of other infectious and parasitic diseases: Secondary | ICD-10-CM

## 2016-09-20 DIAGNOSIS — E538 Deficiency of other specified B group vitamins: Secondary | ICD-10-CM | POA: Diagnosis not present

## 2016-09-20 DIAGNOSIS — D539 Nutritional anemia, unspecified: Secondary | ICD-10-CM

## 2016-09-20 DIAGNOSIS — Z856 Personal history of leukemia: Secondary | ICD-10-CM | POA: Diagnosis not present

## 2016-09-20 LAB — COMPREHENSIVE METABOLIC PANEL
ALT: 20 U/L (ref 17–63)
AST: 25 U/L (ref 15–41)
Albumin: 3.7 g/dL (ref 3.5–5.0)
Alkaline Phosphatase: 58 U/L (ref 38–126)
Anion gap: 8 (ref 5–15)
BUN: 20 mg/dL (ref 6–20)
CO2: 22 mmol/L (ref 22–32)
Calcium: 8.8 mg/dL — ABNORMAL LOW (ref 8.9–10.3)
Chloride: 102 mmol/L (ref 101–111)
Creatinine, Ser: 1.14 mg/dL (ref 0.61–1.24)
GFR calc Af Amer: >60 mL/min (ref >60)
GFR calc non Af Amer: 59 mL/min — ABNORMAL LOW (ref >60)
Glucose, Bld: 266 mg/dL — ABNORMAL HIGH (ref 65–99)
Potassium: 4 mmol/L (ref 3.5–5.1)
Sodium: 132 mmol/L — ABNORMAL LOW (ref 135–145)
Total Bilirubin: 0.6 mg/dL (ref 0.3–1.2)
Total Protein: 6.8 g/dL (ref 6.5–8.1)

## 2016-09-20 LAB — MAGNESIUM: Magnesium: 2 mg/dL (ref 1.7–2.4)

## 2016-09-20 LAB — CBC WITH DIFFERENTIAL/PLATELET
Basophils Absolute: 0 10*3/uL (ref 0–0.1)
Basophils Relative: 0 %
Eosinophils Absolute: 0.1 10*3/uL (ref 0–0.7)
Eosinophils Relative: 5 %
HCT: 25.1 % — ABNORMAL LOW (ref 40.0–52.0)
Hemoglobin: 8.7 g/dL — ABNORMAL LOW (ref 13.0–18.0)
Lymphocytes Relative: 15 %
Lymphs Abs: 0.4 10*3/uL — ABNORMAL LOW (ref 1.0–3.6)
MCH: 36.9 pg — ABNORMAL HIGH (ref 26.0–34.0)
MCHC: 34.7 g/dL (ref 32.0–36.0)
MCV: 106.3 fL — ABNORMAL HIGH (ref 80.0–100.0)
Monocytes Absolute: 0.2 10*3/uL (ref 0.2–1.0)
Monocytes Relative: 5 %
Neutro Abs: 2.3 10*3/uL (ref 1.4–6.5)
Neutrophils Relative %: 75 %
Platelets: 83 10*3/uL — ABNORMAL LOW (ref 150–440)
RBC: 2.36 MIL/uL — ABNORMAL LOW (ref 4.40–5.90)
RDW: 21.5 % — ABNORMAL HIGH (ref 11.5–14.5)
WBC: 3.1 10*3/uL — ABNORMAL LOW (ref 3.8–10.6)

## 2016-09-20 LAB — SAMPLE TO BLOOD BANK

## 2016-09-20 NOTE — Progress Notes (Signed)
Alex Smith day:  09/20/2016   Chief Complaint: Alex Smith is a 82 y.o. male with a myelodysplastic marrow, smoldering myeloma, and a history of hairy cell leukemia who is seen for assessment on day 7 of cycle #2 Revlimid.  HPI:  The patient was last seen in the medical oncology [facility] on 08/22/2016.  At that time, counts had recovered.  Last GCSF was on 08/05/2016.  Decision was made to proceed with his second cycle of Revlimid at a reduced dose (5 mg instead of 10 mg).  He began cycle #2 on 09/14/2016.  CBC on 09/12/2016 revealed a hematocrit of 24.4, hemoglobin 8.5, platelets 85,000, WBC 2900 with an ANC of 2,100.   Symptomatically, he describes feeling "great one day and not the next".  He notes a URI for 3 weeks.  He has been working.  He denies any bruising or bleeding.  He denies any fever or bone pain.   Past Medical History:  Diagnosis Date  . Anemia   . B12 deficiency   . Basal cell carcinoma of face   . BPH (benign prostatic hypertrophy)    followed by urology, discharged (Dr. Bernardo Heater)  . CAP (community acquired pneumonia) 02/15/2015  . Colon polyps   . Diverticulosis   . GERD (gastroesophageal reflux disease)   . Hairy cell leukemia (Alex Smith) 2006   recurrent, seizure on rituxan, now on cladribine (Alex [facility])  . History of pneumonia 2000's   "once" (07/07/2012)  . History of shingles   . HLD (hyperlipidemia)   . Hypertension   . Pneumonia   . Shortness of breath dyspnea   . Systolic murmur 10/93/2355  . Type 2 diabetes, controlled, with retinopathy (Alex [facility])     Past Surgical History:  Procedure Laterality Date  . [place] VITRECTOMY WITH 20 GAUGE MVR PORT FOR MACULAR HOLE  07/07/2012   Procedure: 25 GAUGE PARS PLANA VITRECTOMY WITH 20 GAUGE MVR PORT FOR MACULAR HOLE;  Surgeon: Hayden Pedro, MD;  Location: [facility];  Service: Ophthalmology;  Laterality: Left;  . BONE MARROW BIOPSY  2016  . CARDIOVASCULAR STRESS TEST   2013   treadmill - no evidence ischemia, EF 61%  . CATARACT EXTRACTION W/ INTRAOCULAR LENS  IMPLANT, BILATERAL  ~ 2010  . COLONOSCOPY  2014   [place] WNL no rpt needed, h/o polyps  . EYE SURGERY Left 06/2012   laser surgery  . GAS INSERTION  07/07/2012   Procedure: INSERTION OF GAS;  Surgeon: Hayden Pedro, MD;  Location: [facility];  Service: Ophthalmology;  Laterality: Left;  [id number]  . PERIPHERAL VASCULAR CATHETERIZATION N/A 02/23/2015   Procedure: [place] Cath Insertion;  Surgeon: Algernon Huxley, MD;  Location: [facility];  Service: Cardiovascular;  Laterality: N/A;  . SERUM PATCH  07/07/2012   Procedure: SERUM PATCH;  Surgeon: Hayden Pedro, MD;  Location: [facility];  Service: Ophthalmology;  Laterality: Left;  . SKIN CANCER EXCISION     "all over my face" (07/07/2012)    Family History  Problem Relation Age of Onset  . Dementia Mother   . Heart failure Father 73  . Cancer Sister     breast  . Diabetes Paternal Uncle   . Diabetes Paternal Aunt   . CAD Brother 68    MI  . Stroke Neg Hx     Social History:  reports that he quit smoking about 47 years ago. His smoking use included Cigarettes. He has  a 50.00 pack-year smoking history. He has never used smokeless tobacco. He reports that he drinks alcohol. He reports that he does not use drugs.  He works at [employer] 20-32 hours/week.  The patient 's wife has had 3 hip operations.  She underwent hip replacement 03/06/2016.  She recently hurt her shoulder.  He takes care of his wife.  He states that they either eat out or bring food in.  His best contact number is his cell: [phone].  He is alone today.  Allergies:  Allergies  Allergen Reactions  . Rituximab Rash    Chest tightness  . Blood-Group Specific Substance Other (See Comments)    Had a post transfusion reaction of red blood cells; NOW REQUIRES WASHED BLOOD CELLS  . Primaxin [Imipenem] Other (See Comments)    Possible allergy  . Sulfa Antibiotics Itching and Rash   Possible allergy to Zoloft, allopurinol, and a chemotherapy medication.  He tolerates Dapsone.  Current Medications: Current Outpatient Prescriptions  Medication Sig Dispense Refill  . acyclovir (ZOVIRAX) 400 MG tablet TAKE TWO TABLETS TWICE DAILY 120 tablet 3  . aspirin EC 81 MG tablet Take 81 mg by mouth daily.    Marland Kitchen dexamethasone (DECADRON) 4 MG tablet Take 5 tablets (20 mg total) by mouth once a week. 20 tablet 1  . glucose blood (ACCU-CHEK AVIVA PLUS) test strip Use to check sugar twice daily Dx:E11.9 200 each 3  . HUMULIN R 100 UNIT/ML injection INJECT 10 UNITS SUBCUTANEOUSLY TWICE DAILY BEFORE MEALS 10 mL 6  . hydrochlorothiazide (HYDRODIURIL) 25 MG tablet TAKE ONE TABLET BY MOUTH EVERY DAY 30 tablet 3  . insulin NPH Human (HUMULIN N) 100 UNIT/ML injection Inject 0.39 mLs (39 Units total) into the skin 2 (two) times daily before a meal.    . lenalidomide (REVLIMID) 5 MG capsule 5 mg a day for 21 days then 7 days off 21 capsule 2  . lidocaine-prilocaine (EMLA) cream Apply 1 application topically as needed. 30 g 3  . lisinopril (PRINIVIL,ZESTRIL) 20 MG tablet TAKE ONE TABLET TWICE DAILY 60 tablet 6  . metFORMIN (GLUCOPHAGE) 1000 MG tablet TAKE ONE TABLET BY MOUTH TWICE DAILY AS DIRECTED 180 tablet 3  . MICROLET LANCETS MISC USE AS DIRECTED 200 each 3  . pravastatin (PRAVACHOL) 20 MG tablet TAKE ONE TABLET AT BEDTIME 30 tablet 3  . ranitidine (ZANTAC) 150 MG tablet TAKE 1 TABLET BY MOUTH TWICE DAILY AS NEEDED 180 tablet 3  . tamsulosin (FLOMAX) 0.4 MG CAPS capsule Take 2 capsules (0.8 mg total) by mouth daily. 60 capsule 1  . temazepam (RESTORIL) 7.5 MG capsule Take 7.5 mg by mouth once.     . traZODone (DESYREL) 50 MG tablet Take 0.5-1 tablets (25-50 mg total) by mouth at bedtime as needed for sleep. 30 tablet 3  . vitamin B-12 (CYANOCOBALAMIN) 1000 MCG tablet Take 1,000 mcg by mouth daily.    . vitamin E 400 UNIT capsule Take 200 Units by mouth daily.      No current  facility-administered medications for this visit.    Facility-Administered Medications Ordered in Other Visits  Medication Dose Route Frequency Provider Last Rate Last Dose  . heparin lock flush 100 unit/mL  500 Units Intravenous Once Lequita Asal, MD      . sodium chloride 0.9 % injection 10 mL  10 mL Intravenous PRN Lequita Asal, MD   10 mL at 03/03/15 0903  . sodium chloride 0.9 % injection 10 mL  10 mL Intracatheter  PRN Lequita Asal, MD   10 mL at 03/10/15 1410   Review of Systems:  GENERAL:  Feels "great one day and not the next".  No fevers or sweats.  Weight up 5 pounds. PERFORMANCE STATUS (ECOG):  1 HEENT: URI x 3 weeks.  No visual changes, runny nose, sore throat. Pulmonary:  Chronic shortness of breath on exertion.  No cough.  No hemoptysis. Cardiac:  No chest pain, palpitations, orthopnea, or PND. GI:  Appetite good.  No nausea, vomiting, diarrhea, constipation, melena or hematochezia.  GU:  No urgency, frequency, dysuria, or hematuria.  On Flomax. Musculoskeletal:  No back pain.  No joint pain.  No muscle tenderness. Extremities:  No pain or swelling. Skin:  No rashes or skin changes. Neuro:  No headache, numbness or weakness, balance or coordination issues. Endocrine:  Diabetes.  No thyroid issues, hot flashes or night sweats. Psych:  No mood changes, depression or anxiety.  Pain:  No focal pain. Review of systems:  All other systems reviewed and found to be negative.   Physical Exam: Blood pressure (!) 151/68, pulse 82, temperature 97.4 F (36.3 C), temperature source Tympanic, resp. rate 18, weight 220 lb 0.3 oz (99.8 kg).  GENERAL:  Well developed, well nourished, gentleman sitting comfortably in the exam room in no acute distress. MENTAL STATUS:  Alert and oriented to person, place and time. HEAD:  Pearline Cables white hair.  Normocephalic, atraumatic, face symmetric, no Cushingoid features. EYES:  Glasses. Blue eyes.  Pupils equal round and reactive to light  and accomodation.  No conjunctivitis or scleral icterus. ENT:  Oropharynx clear without lesion.  Tongue normal. Mucous membranes moist.  RESPIRATORY:  Clear to auscultation without rales, wheezes or rhonchi. CARDIOVASCULAR:  Regular rate and rhythm without murmur, rub or gallop. ABDOMEN:  Soft, non-tender, with active bowel sounds, and no hepatosplenomegaly.  No masses. SKIN:  No rashes or ulcers. EXTREMITIES: No edema, no skin discoloration or tenderness.  No palpable cords. LYMPH NODES: No palpable cervical, supraclavicular, axillary or inguinal adenopathy  NEUROLOGICAL: Unremarkable. PSYCH:  Appropriate.    Orders Only on 09/20/2016  Component Date Value Ref Range Status  . WBC 09/20/2016 3.1* 3.8 - 10.6 K/uL Final  . RBC 09/20/2016 2.36* 4.40 - 5.90 MIL/uL Final  . Hemoglobin 09/20/2016 8.7* 13.0 - 18.0 g/dL Final  . HCT 09/20/2016 25.1* 40.0 - 52.0 % Final  . MCV 09/20/2016 106.3* 80.0 - 100.0 fL Final  . MCH 09/20/2016 36.9* 26.0 - 34.0 pg Final  . MCHC 09/20/2016 34.7  32.0 - 36.0 g/dL Final  . RDW 09/20/2016 21.5* 11.5 - 14.5 % Final  . Platelets 09/20/2016 83* 150 - 440 K/uL Final  . Neutrophils Relative % 09/20/2016 75  % Final  . Neutro Abs 09/20/2016 2.3  1.4 - 6.5 K/uL Final  . Lymphocytes Relative 09/20/2016 15  % Final  . Lymphs Abs 09/20/2016 0.4* 1.0 - 3.6 K/uL Final  . Monocytes Relative 09/20/2016 5  % Final  . Monocytes Absolute 09/20/2016 0.2  0.2 - 1.0 K/uL Final  . Eosinophils Relative 09/20/2016 5  % Final  . Eosinophils Absolute 09/20/2016 0.1  0 - 0.7 K/uL Final  . Basophils Relative 09/20/2016 0  % Final  . Basophils Absolute 09/20/2016 0.0  0 - 0.1 K/uL Final  . Sodium 09/20/2016 132* 135 - 145 mmol/L Final  . Potassium 09/20/2016 4.0  3.5 - 5.1 mmol/L Final  . Chloride 09/20/2016 102  101 - 111 mmol/L Final  . CO2 09/20/2016  22  22 - 32 mmol/L Final  . Glucose, Bld 09/20/2016 266* 65 - 99 mg/dL Final  . BUN 09/20/2016 20  6 - 20 mg/dL Final  .  Creatinine, Ser 09/20/2016 1.14  0.61 - 1.24 mg/dL Final  . Calcium 09/20/2016 8.8* 8.9 - 10.3 mg/dL Final  . Total Protein 09/20/2016 6.8  6.5 - 8.1 g/dL Final  . Albumin 09/20/2016 3.7  3.5 - 5.0 g/dL Final  . AST 09/20/2016 25  15 - 41 U/L Final  . ALT 09/20/2016 20  17 - 63 U/L Final  . Alkaline Phosphatase 09/20/2016 58  38 - 126 U/L Final  . Total Bilirubin 09/20/2016 0.6  0.3 - 1.2 mg/dL Final  . GFR calc non Af Amer 09/20/2016 59* >60 mL/min Final  . GFR calc Af Amer 09/20/2016 >60  >60 mL/min Final   Comment: (NOTE) The eGFR has been calculated using the CKD EPI equation. This calculation has not been validated in all clinical situations. eGFR's persistently <60 mL/min signify possible Chronic Kidney Disease.   . Anion gap 09/20/2016 8  5 - 15 Final  . Magnesium 09/20/2016 2.0  1.7 - 2.4 mg/dL Final  . Blood Bank Specimen 09/20/2016 SAMPLE AVAILABLE FOR TESTING   Final  . Sample Expiration 09/20/2016 09/23/2016   Final    Assessment:  YOVANNY COATS is a 82 y.o. 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  He was diagnosed with hairy cell leukemia in 2006.  He received cladribine 11/06-11/05/2005.  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   Bone marrow aspirate and biopsy on 02/07/2015 revealed a extensive marrow involvement by recurrent hairy cell leukemia (approximately 80% of cells in the core). There was a monoclonal plasma cell infiltrate (approximately 10%), compatible with a plasma cell neoplasm. Marrow was hypercellular (40-50%) with markedly diminished residual trilineage hematopoiesis and mild multilineage dyspoiesis.  Peripheral smear revealed leukopenia (WBC 1900) with 2% atypical lymphoid cells compatible with hairy cell leukemia. There was moderate anemia with anisopoikilocytosis. 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  FISH studies for MDS were negative.   Cytogenetics were normal (46, XY).  He has an unclear allergy history.  He may be allergic to Primaxin, Zoloft, allopurinol, and a chemotherapy medication.  Reaction occurred in 06/2005.  He is allergic to sulfa.  He is not allergic to dapsone.  He requires washed RBCs.  He is on prophylactic acyclovir.  Dapsone and voriconazole were discontinued.  He has a history of shingles prior to prophylactic acyclovir.  He is 17 months status post cladribine (began 03/03/2015).  His counts plateaued on 07/25/2015 and have subsequently decreased.  Labs on 07/25/2015 revealed a normal B12 (894), folate (60.5), and TSH (2.562).  Ferritin was 50 on 04/26/2016.  Labs on 08/22/2016 revealed a normal B12, folate, and TSH.  He is on oral B12.  Bone marrow on 11/03/2015 revealed persistent plasma cell neoplasm, now with mildy atypical monoclonal plasma cells estimated at 20-30%.   There was no morphologic evidence of residual hairy cell leukemia.  There was a minute population (0.03%) with hairy cell immunophenotype detected by flow.  Marrow was normocellular for age with relative erythroid hyperplasia, relative myeloid hypoplasia, mild dyspoiesis, adequate megakaryocytes and no increased blasts.  There was no significant increase in marrow reticulin fibers.  Iron was present.  FISH studies for MDS were negative.  FISH panel for myeloma revealed CCND1/IGH translocation-  t(11;14) and loss  of MAF/16q.  Cytogenetics were normal (46, XY).  Bone marrow on 03/13/2016 was normocellular to marginally hypercellular for age (20-40%) with relative erythroid hyperplasia and mild dyserythropoiesis, decreased granulopoiesis and adequate megakaryocytes.  There was a persistent 15-20% plasma cell neoplasm.  There was no overt increase in reticulin fibrosis.  Storage iron was present.  Flow cytometry revealed abnormal/monocytic plasma cell population (3% sample), consistent with a plasma cell neoplastic process.  Cytogenetics were normal  (46, XY).  FISH studies for myeloma revealed a CCND1/IGH translocation, t(11;14) in 53% of nuclei.  SPEP on 11/17/2015 revealed a 1.3 gm/dL monoclonal spike (1.4 gm/dL on 07/25/2015 and 1.0 on 02/03/2015).  Kappa free light chains were 50.51, lambda free light chains were 10.48 with a ratio of 4.82 (ratio 3.68 on 07/25/2015).  Beta2-microglobulin was 2.5.  24 hour urine on 11/21/2015 revealed 234.8 mg protein in 24 hours (no monoclonal protein).  Bone survey on 11/24/2015 revealed no lytic lesions.  Epo level was 329.6 on 03/29/2016.  He began a trial of Procrit.  He received 10,000 units of Procrit weekly (04/26/2016 - 06/14/2016).  Hemoglobin ranged between 8.4 and 8.9 without trend.    He is s/p 1 cycle of Revlimid (10 mg) and Decadron (06/29/2016 - 07/19/2016).  Cycle #1 was complicated by anemia and neutropenia.  He has received 3 units of PRBCs (1 unit:07/22/2016 and 2 units: 08/07/2016).  He received 6 days of GCSF (last 08/05/2016).  He is currently day 7 of cycle #2 Revlimid (31m) and Decadron (09/14/2016).  Counts are good.  He is on aspirin 81 mg a day prophylaxis.  PET scan on 03/14/2016 revealed no evidence of hypermetabolic lymphadenopathy or focal skeletal lesions to suggest marrow or cortical bone involvement.  Symptomatically, he feels great one day and not the next (unrelated to Decadron).  Exam is stable.  Counts include a hematocrit 25.1, hemoglobin of 8.7, platelets 83,000 and ANC 2,300.  Plan: 1.  Labs today:  CBC with diff, CMP, Mg, SPEP, hold tube. 2.  Continue Revlimid 5 mg a day (3 weeks on and 1 week off). 3.  Discuss weekly labs with this cycle and decreasing frequency of lab draw if does well with this cycle.  If issues with anemia, consider reinstitution of Procrit. 4.  RTC weekly x 3 for labs (CBC with diff, hold tube). 5.  RTC in 4 weeks for MD assessment and labs (CBC with diff, CMP, hold tube).   MLequita Asal MD  09/20/2016, 2:55 PM

## 2016-09-20 NOTE — Progress Notes (Signed)
Patient states he is tolerating revlimid fine.

## 2016-09-22 ENCOUNTER — Encounter: Payer: Self-pay | Admitting: Hematology and Oncology

## 2016-09-22 DIAGNOSIS — D701 Agranulocytosis secondary to cancer chemotherapy: Secondary | ICD-10-CM | POA: Insufficient documentation

## 2016-09-22 DIAGNOSIS — T451X5A Adverse effect of antineoplastic and immunosuppressive drugs, initial encounter: Secondary | ICD-10-CM

## 2016-09-23 ENCOUNTER — Ambulatory Visit (INDEPENDENT_AMBULATORY_CARE_PROVIDER_SITE_OTHER): Payer: Medicare Other | Admitting: Internal Medicine

## 2016-09-23 ENCOUNTER — Encounter: Payer: Self-pay | Admitting: Internal Medicine

## 2016-09-23 VITALS — BP 124/70 | HR 89 | Temp 97.4°F | Wt 217.0 lb

## 2016-09-23 DIAGNOSIS — J069 Acute upper respiratory infection, unspecified: Secondary | ICD-10-CM | POA: Diagnosis not present

## 2016-09-23 LAB — PROTEIN ELECTROPHORESIS, SERUM
A/G Ratio: 1.3 (ref 0.7–1.7)
Albumin ELP: 3.5 g/dL (ref 2.9–4.4)
Alpha-1-Globulin: 0.2 g/dL (ref 0.0–0.4)
Alpha-2-Globulin: 0.6 g/dL (ref 0.4–1.0)
Beta Globulin: 0.8 g/dL (ref 0.7–1.3)
Gamma Globulin: 1.2 g/dL (ref 0.4–1.8)
Globulin, Total: 2.8 g/dL (ref 2.2–3.9)
M-Spike, %: 1 g/dL — ABNORMAL HIGH
Total Protein ELP: 6.3 g/dL (ref 6.0–8.5)

## 2016-09-23 MED ORDER — AZITHROMYCIN 250 MG PO TABS: ORAL_TABLET | ORAL | 0 refills | Status: DC

## 2016-09-23 MED ORDER — HYDROCODONE-HOMATROPINE 5-1.5 MG/5ML PO SYRP: 5.0000 mL | ORAL_SOLUTION | Freq: Three times a day (TID) | ORAL | 0 refills | Status: DC | PRN

## 2016-09-23 NOTE — Progress Notes (Signed)
HPI  Pt presents to the clinic today with c/o nasal congestion and cough. This started  3-4 weeks ago. He is blowing green mucous out of his nose. The cough is productive of green mucous. He denies ear pain, sore throat or shortness of breath. He has tried Claritin D and Robitussin with some relief. He has no history of seasonal allergies but does have DM 2 and CAP. He did have sick contacts. His flu shot is UTD.  Review of Systems        Past Medical History:  Diagnosis Date  . Anemia   . B12 deficiency   . Basal cell carcinoma of face   . BPH (benign prostatic hypertrophy)    followed by urology, discharged (Dr. Bernardo Heater)  . CAP (community acquired pneumonia) 02/15/2015  . Colon polyps   . Diverticulosis   . GERD (gastroesophageal reflux disease)   . Hairy cell leukemia ([place]) 2006   recurrent, seizure on rituxan, now on cladribine ([place])  . History of pneumonia 2000's   "once" (07/07/2012)  . History of shingles   . HLD (hyperlipidemia)   . Hypertension   . Pneumonia   . Shortness of breath dyspnea   . Systolic murmur [id number]  . Type 2 diabetes, controlled, with retinopathy ([place])     Family History  Problem Relation Age of Onset  . Dementia Mother   . Heart failure Father 65  . Cancer Sister     breast  . Diabetes Paternal Uncle   . Diabetes Paternal Aunt   . CAD Brother 71    MI  . Stroke Neg Hx     Social History   Social History  . Marital status: Married    Spouse name: N/A  . Number of children: N/A  . Years of education: N/A   Occupational History  . Not on file.   Social History Main Topics  . Smoking status: Former Smoker    Packs/day: 2.00    Years: 25.00    Types: Cigarettes    Quit date: 02/06/1969  . Smokeless tobacco: Never Used     Comment: 07/07/2012 "stopped smoking ~ 40 yr ago; smoked 20-20yr"  . Alcohol use 0.0 oz/week     Comment: rare  . Drug use: No  . Sexual activity: No   Other Topics Concern  . Not on file   Social  History Narrative   Married >50 yrs, cares for wife. 1 cat   Occupation: was Art gallery manager, works part time for [employer]   Activity: likes to golf   Diet: moderate water, fruits/vegetables daily    Allergies  Allergen Reactions  . Rituximab Rash    Chest tightness  . Blood-Group Specific Substance Other (See Comments)    Had a post transfusion reaction of red blood cells; NOW REQUIRES WASHED BLOOD CELLS  . Primaxin [Imipenem] Other (See Comments)    Possible allergy  . Sulfa Antibiotics Itching and Rash     Constitutional: Denies headache, fatigue, fever or abrupt weight changes.  HEENT:  Positive nasal congestion. Denies eye redness, eye pain, pressure behind the eyes, facial pain, ear pain, ringing in the ears, wax buildup, runny nose or bloody nose. Respiratory: Positive cough. Denies difficulty breathing or shortness of breath.  Cardiovascular: Denies chest pain, chest tightness, palpitations or swelling in the hands or feet.   No other specific complaints in a complete review of systems (except as listed in HPI above).  Objective:   BP 124/70   Pulse  89   Temp 97.4 F (36.3 C) (Oral)   Wt 217 lb (98.4 kg)   SpO2 97%   BMI 35.56 kg/m   Wt Readings from Last 3 Encounters:  09/23/16 217 lb (98.4 kg)  09/20/16 220 lb 0.3 oz (99.8 kg)  08/22/16 215 lb 9.8 oz (97.8 kg)     General: Appears his stated age, in NAD. HEENT: Head: normal shape and size, no sinus tenderness noted;  Ears: Tm's gray and intact, normal light reflex; Nose: mucosa pink and moist, septum midline; Throat/Mouth: + PND. Teeth present, mucosa pink and moist, no exudate noted, no lesions or ulcerations noted.  Neck: No cervical lymphadenopathy.  Cardiovascular: Normal rate and rhythm. Murmur noted. Pulmonary/Chest: Normal effort and positive vesicular breath sounds. No respiratory distress. No wheezes, rales or ronchi noted.       Assessment & Plan:   Upper Respiratory Infection:  Get some  rest and drink plenty of water Given duration of symptoms, and immunocompromised, will traeat. eRx for Azithromax x 5 days Rx for Hycodan cough syrup  RTC as needed or if symptoms persist.   Webb Silversmith, NP

## 2016-09-23 NOTE — Patient Instructions (Signed)
Upper Respiratory Infection, Adult Most upper respiratory infections (URIs) are caused by a virus. A URI affects the nose, throat, and upper air passages. The most common type of URI is often called "the common cold." Follow these instructions at home:  Take medicines only as told by your doctor.  Gargle warm saltwater or take cough drops to comfort your throat as told by your doctor.  Use a warm mist humidifier or inhale steam from a shower to increase air moisture. This may make it easier to breathe.  Drink enough fluid to keep your pee (urine) clear or pale yellow.  Eat soups and other clear broths.  Have a healthy diet.  Rest as needed.  Go back to work when your fever is gone or your doctor says it is okay.  You may need to stay home longer to avoid giving your URI to others.  You can also wear a face mask and wash your hands often to prevent spread of the virus.  Use your inhaler more if you have asthma.  Do not use any tobacco products, including cigarettes, chewing tobacco, or electronic cigarettes. If you need help quitting, ask your doctor. Contact a doctor if:  You are getting worse, not better.  Your symptoms are not helped by medicine.  You have chills.  You are getting more short of breath.  You have brown or red mucus.  You have yellow or brown discharge from your nose.  You have pain in your face, especially when you bend forward.  You have a fever.  You have puffy (swollen) neck glands.  You have pain while swallowing.  You have white areas in the back of your throat. Get help right away if:  You have very bad or constant:  Headache.  Ear pain.  Pain in your forehead, behind your eyes, and over your cheekbones (sinus pain).  Chest pain.  You have long-lasting (chronic) lung disease and any of the following:  Wheezing.  Long-lasting cough.  Coughing up blood.  A change in your usual mucus.  You have a stiff neck.  You have  changes in your:  Vision.  Hearing.  Thinking.  Mood. This information is not intended to replace advice given to you by your health care provider. Make sure you discuss any questions you have with your health care provider. Document Released: 01/22/2008 Document Revised: 04/07/2016 Document Reviewed: 11/10/2013 Elsevier Interactive Patient Education  2017 [employer]  

## 2016-09-24 ENCOUNTER — Telehealth: Payer: Self-pay | Admitting: *Deleted

## 2016-09-24 NOTE — Telephone Encounter (Signed)
Called patient and informed him that he was approved for a medication grant through the patient advocate co pay assistance program.

## 2016-09-27 ENCOUNTER — Telehealth: Payer: Self-pay | Admitting: *Deleted

## 2016-09-27 ENCOUNTER — Inpatient Hospital Stay: Payer: Medicare Other

## 2016-09-27 DIAGNOSIS — Z803 Family history of malignant neoplasm of breast: Secondary | ICD-10-CM | POA: Diagnosis not present

## 2016-09-27 DIAGNOSIS — E538 Deficiency of other specified B group vitamins: Secondary | ICD-10-CM | POA: Diagnosis not present

## 2016-09-27 DIAGNOSIS — C9000 Multiple myeloma not having achieved remission: Secondary | ICD-10-CM | POA: Diagnosis not present

## 2016-09-27 DIAGNOSIS — Z85828 Personal history of other malignant neoplasm of skin: Secondary | ICD-10-CM | POA: Diagnosis not present

## 2016-09-27 DIAGNOSIS — D649 Anemia, unspecified: Secondary | ICD-10-CM | POA: Diagnosis not present

## 2016-09-27 DIAGNOSIS — Z7982 Long term (current) use of aspirin: Secondary | ICD-10-CM | POA: Diagnosis not present

## 2016-09-27 DIAGNOSIS — Z86010 Personal history of colonic polyps: Secondary | ICD-10-CM | POA: Diagnosis not present

## 2016-09-27 DIAGNOSIS — Z79899 Other long term (current) drug therapy: Secondary | ICD-10-CM | POA: Diagnosis not present

## 2016-09-27 DIAGNOSIS — D46Z Other myelodysplastic syndromes: Secondary | ICD-10-CM | POA: Diagnosis not present

## 2016-09-27 DIAGNOSIS — Z8719 Personal history of other diseases of the digestive system: Secondary | ICD-10-CM | POA: Diagnosis not present

## 2016-09-27 DIAGNOSIS — Z856 Personal history of leukemia: Secondary | ICD-10-CM | POA: Diagnosis not present

## 2016-09-27 DIAGNOSIS — E119 Type 2 diabetes mellitus without complications: Secondary | ICD-10-CM | POA: Diagnosis not present

## 2016-09-27 DIAGNOSIS — Z8619 Personal history of other infectious and parasitic diseases: Secondary | ICD-10-CM | POA: Diagnosis not present

## 2016-09-27 DIAGNOSIS — N400 Enlarged prostate without lower urinary tract symptoms: Secondary | ICD-10-CM | POA: Diagnosis not present

## 2016-09-27 DIAGNOSIS — R0602 Shortness of breath: Secondary | ICD-10-CM | POA: Diagnosis not present

## 2016-09-27 DIAGNOSIS — R011 Cardiac murmur, unspecified: Secondary | ICD-10-CM | POA: Diagnosis not present

## 2016-09-27 DIAGNOSIS — K219 Gastro-esophageal reflux disease without esophagitis: Secondary | ICD-10-CM | POA: Diagnosis not present

## 2016-09-27 DIAGNOSIS — I10 Essential (primary) hypertension: Secondary | ICD-10-CM | POA: Diagnosis not present

## 2016-09-27 DIAGNOSIS — Z794 Long term (current) use of insulin: Secondary | ICD-10-CM | POA: Diagnosis not present

## 2016-09-27 DIAGNOSIS — Z87891 Personal history of nicotine dependence: Secondary | ICD-10-CM | POA: Diagnosis not present

## 2016-09-27 DIAGNOSIS — C9141 Hairy cell leukemia, in remission: Secondary | ICD-10-CM

## 2016-09-27 DIAGNOSIS — D72819 Decreased white blood cell count, unspecified: Secondary | ICD-10-CM | POA: Diagnosis not present

## 2016-09-27 LAB — SAMPLE TO BLOOD BANK

## 2016-09-27 LAB — CBC WITH DIFFERENTIAL/PLATELET
Basophils Absolute: 0 10*3/uL (ref 0–0.1)
Basophils Relative: 1 %
Eosinophils Absolute: 0.1 10*3/uL (ref 0–0.7)
Eosinophils Relative: 5 %
HCT: 23.4 % — ABNORMAL LOW (ref 40.0–52.0)
Hemoglobin: 8.2 g/dL — ABNORMAL LOW (ref 13.0–18.0)
Lymphocytes Relative: 17 %
Lymphs Abs: 0.5 10*3/uL — ABNORMAL LOW (ref 1.0–3.6)
MCH: 37.7 pg — ABNORMAL HIGH (ref 26.0–34.0)
MCHC: 35.1 g/dL (ref 32.0–36.0)
MCV: 107.6 fL — ABNORMAL HIGH (ref 80.0–100.0)
Monocytes Absolute: 0.3 10*3/uL (ref 0.2–1.0)
Monocytes Relative: 9 %
Neutro Abs: 2.2 10*3/uL (ref 1.4–6.5)
Neutrophils Relative %: 68 %
Platelets: 80 10*3/uL — ABNORMAL LOW (ref 150–440)
RBC: 2.18 MIL/uL — ABNORMAL LOW (ref 4.40–5.90)
RDW: 20.3 % — ABNORMAL HIGH (ref 11.5–14.5)
WBC: 3.2 10*3/uL — ABNORMAL LOW (ref 3.8–10.6)

## 2016-09-27 NOTE — Telephone Encounter (Signed)
Patient called to discuss results of hgb and possible need for blood transfusion next week and that the MD might consider restarting procrit with revlimid for decreasing hgb, voiced understanding. Pt reports feeling ok at this time.

## 2016-10-02 ENCOUNTER — Telehealth: Payer: Self-pay | Admitting: *Deleted

## 2016-10-02 NOTE — Telephone Encounter (Signed)
Returned patient call and he is need for another medication refill for Revlimid. Instructed patient that the refill would be completed, voiced understanding.

## 2016-10-03 ENCOUNTER — Other Ambulatory Visit: Payer: Self-pay | Admitting: *Deleted

## 2016-10-03 MED ORDER — LENALIDOMIDE 5 MG PO CAPS: ORAL_CAPSULE | ORAL | 0 refills | Status: DC

## 2016-10-04 ENCOUNTER — Inpatient Hospital Stay: Payer: Medicare Other

## 2016-10-04 ENCOUNTER — Telehealth: Payer: Self-pay | Admitting: *Deleted

## 2016-10-04 DIAGNOSIS — Z86010 Personal history of colonic polyps: Secondary | ICD-10-CM | POA: Diagnosis not present

## 2016-10-04 DIAGNOSIS — C9000 Multiple myeloma not having achieved remission: Secondary | ICD-10-CM

## 2016-10-04 DIAGNOSIS — Z87891 Personal history of nicotine dependence: Secondary | ICD-10-CM | POA: Diagnosis not present

## 2016-10-04 DIAGNOSIS — D649 Anemia, unspecified: Secondary | ICD-10-CM | POA: Diagnosis not present

## 2016-10-04 DIAGNOSIS — Z8619 Personal history of other infectious and parasitic diseases: Secondary | ICD-10-CM | POA: Diagnosis not present

## 2016-10-04 DIAGNOSIS — R011 Cardiac murmur, unspecified: Secondary | ICD-10-CM | POA: Diagnosis not present

## 2016-10-04 DIAGNOSIS — E538 Deficiency of other specified B group vitamins: Secondary | ICD-10-CM | POA: Diagnosis not present

## 2016-10-04 DIAGNOSIS — Z803 Family history of malignant neoplasm of breast: Secondary | ICD-10-CM | POA: Diagnosis not present

## 2016-10-04 DIAGNOSIS — N400 Enlarged prostate without lower urinary tract symptoms: Secondary | ICD-10-CM | POA: Diagnosis not present

## 2016-10-04 DIAGNOSIS — Z85828 Personal history of other malignant neoplasm of skin: Secondary | ICD-10-CM | POA: Diagnosis not present

## 2016-10-04 DIAGNOSIS — I10 Essential (primary) hypertension: Secondary | ICD-10-CM | POA: Diagnosis not present

## 2016-10-04 DIAGNOSIS — Z794 Long term (current) use of insulin: Secondary | ICD-10-CM | POA: Diagnosis not present

## 2016-10-04 DIAGNOSIS — Z79899 Other long term (current) drug therapy: Secondary | ICD-10-CM | POA: Diagnosis not present

## 2016-10-04 DIAGNOSIS — Z856 Personal history of leukemia: Secondary | ICD-10-CM | POA: Diagnosis not present

## 2016-10-04 DIAGNOSIS — Z7982 Long term (current) use of aspirin: Secondary | ICD-10-CM | POA: Diagnosis not present

## 2016-10-04 DIAGNOSIS — Z8719 Personal history of other diseases of the digestive system: Secondary | ICD-10-CM | POA: Diagnosis not present

## 2016-10-04 DIAGNOSIS — C9141 Hairy cell leukemia, in remission: Secondary | ICD-10-CM

## 2016-10-04 DIAGNOSIS — K219 Gastro-esophageal reflux disease without esophagitis: Secondary | ICD-10-CM | POA: Diagnosis not present

## 2016-10-04 DIAGNOSIS — D46Z Other myelodysplastic syndromes: Secondary | ICD-10-CM | POA: Diagnosis not present

## 2016-10-04 DIAGNOSIS — R0602 Shortness of breath: Secondary | ICD-10-CM | POA: Diagnosis not present

## 2016-10-04 DIAGNOSIS — E119 Type 2 diabetes mellitus without complications: Secondary | ICD-10-CM | POA: Diagnosis not present

## 2016-10-04 DIAGNOSIS — D72819 Decreased white blood cell count, unspecified: Secondary | ICD-10-CM | POA: Diagnosis not present

## 2016-10-04 LAB — CBC WITH DIFFERENTIAL/PLATELET
Basophils Absolute: 0 10*3/uL (ref 0–0.1)
Basophils Relative: 1 %
Eosinophils Absolute: 0.2 10*3/uL (ref 0–0.7)
Eosinophils Relative: 9 %
HCT: 24.4 % — ABNORMAL LOW (ref 40.0–52.0)
Hemoglobin: 8.5 g/dL — ABNORMAL LOW (ref 13.0–18.0)
Lymphocytes Relative: 24 %
Lymphs Abs: 0.4 10*3/uL — ABNORMAL LOW (ref 1.0–3.6)
MCH: 37.6 pg — ABNORMAL HIGH (ref 26.0–34.0)
MCHC: 34.8 g/dL (ref 32.0–36.0)
MCV: 108.1 fL — ABNORMAL HIGH (ref 80.0–100.0)
Monocytes Absolute: 0.1 10*3/uL — ABNORMAL LOW (ref 0.2–1.0)
Monocytes Relative: 7 %
Neutro Abs: 1.1 10*3/uL — ABNORMAL LOW (ref 1.4–6.5)
Neutrophils Relative %: 59 %
Platelets: 80 10*3/uL — ABNORMAL LOW (ref 150–440)
RBC: 2.26 MIL/uL — ABNORMAL LOW (ref 4.40–5.90)
RDW: 19.4 % — ABNORMAL HIGH (ref 11.5–14.5)
WBC: 1.8 10*3/uL — ABNORMAL LOW (ref 3.8–10.6)

## 2016-10-04 NOTE — Telephone Encounter (Signed)
Called patient and LVM that he should be on neutropenic precautions.  No buffets, fresh flowers, unpeeled or uncooked fruits and vegetables, hand shaking, hugging, shopping without a mask.  Should call us if he develops any fever or gets any infection.  Also asked him to call back with where he is with his Revlimid.  According to our records he should be on his break.

## 2016-10-04 NOTE — Telephone Encounter (Signed)
-----   Message from Lequita Asal, MD sent at 10/04/2016  4:26 PM EST ----- Regarding: Please call patient  Where is he in his Revlimid?  Please call with counts.  M ----- Message ----- From: Interface, Lab In [place] Sent: 10/04/2016   3:02 PM To: Lequita Asal, MD

## 2016-10-07 ENCOUNTER — Other Ambulatory Visit: Payer: Self-pay | Admitting: Family Medicine

## 2016-10-11 ENCOUNTER — Inpatient Hospital Stay: Payer: Medicare Other

## 2016-10-11 DIAGNOSIS — D46Z Other myelodysplastic syndromes: Secondary | ICD-10-CM

## 2016-10-11 DIAGNOSIS — R0602 Shortness of breath: Secondary | ICD-10-CM | POA: Diagnosis not present

## 2016-10-11 DIAGNOSIS — Z8719 Personal history of other diseases of the digestive system: Secondary | ICD-10-CM | POA: Diagnosis not present

## 2016-10-11 DIAGNOSIS — Z8619 Personal history of other infectious and parasitic diseases: Secondary | ICD-10-CM | POA: Diagnosis not present

## 2016-10-11 DIAGNOSIS — Z7982 Long term (current) use of aspirin: Secondary | ICD-10-CM | POA: Diagnosis not present

## 2016-10-11 DIAGNOSIS — N400 Enlarged prostate without lower urinary tract symptoms: Secondary | ICD-10-CM | POA: Diagnosis not present

## 2016-10-11 DIAGNOSIS — C9000 Multiple myeloma not having achieved remission: Secondary | ICD-10-CM

## 2016-10-11 DIAGNOSIS — Z803 Family history of malignant neoplasm of breast: Secondary | ICD-10-CM | POA: Diagnosis not present

## 2016-10-11 DIAGNOSIS — C9141 Hairy cell leukemia, in remission: Secondary | ICD-10-CM

## 2016-10-11 DIAGNOSIS — I10 Essential (primary) hypertension: Secondary | ICD-10-CM | POA: Diagnosis not present

## 2016-10-11 DIAGNOSIS — K219 Gastro-esophageal reflux disease without esophagitis: Secondary | ICD-10-CM | POA: Diagnosis not present

## 2016-10-11 DIAGNOSIS — Z87891 Personal history of nicotine dependence: Secondary | ICD-10-CM | POA: Diagnosis not present

## 2016-10-11 DIAGNOSIS — Z794 Long term (current) use of insulin: Secondary | ICD-10-CM | POA: Diagnosis not present

## 2016-10-11 DIAGNOSIS — Z86010 Personal history of colonic polyps: Secondary | ICD-10-CM | POA: Diagnosis not present

## 2016-10-11 DIAGNOSIS — Z79899 Other long term (current) drug therapy: Secondary | ICD-10-CM | POA: Diagnosis not present

## 2016-10-11 DIAGNOSIS — R011 Cardiac murmur, unspecified: Secondary | ICD-10-CM | POA: Diagnosis not present

## 2016-10-11 DIAGNOSIS — D649 Anemia, unspecified: Secondary | ICD-10-CM | POA: Diagnosis not present

## 2016-10-11 DIAGNOSIS — Z856 Personal history of leukemia: Secondary | ICD-10-CM | POA: Diagnosis not present

## 2016-10-11 DIAGNOSIS — E538 Deficiency of other specified B group vitamins: Secondary | ICD-10-CM | POA: Diagnosis not present

## 2016-10-11 DIAGNOSIS — E119 Type 2 diabetes mellitus without complications: Secondary | ICD-10-CM | POA: Diagnosis not present

## 2016-10-11 DIAGNOSIS — Z85828 Personal history of other malignant neoplasm of skin: Secondary | ICD-10-CM | POA: Diagnosis not present

## 2016-10-11 DIAGNOSIS — D72819 Decreased white blood cell count, unspecified: Secondary | ICD-10-CM | POA: Diagnosis not present

## 2016-10-11 LAB — SAMPLE TO BLOOD BANK

## 2016-10-11 LAB — CBC WITH DIFFERENTIAL/PLATELET
Basophils Absolute: 0 10*3/uL (ref 0–0.1)
Basophils Relative: 2 %
Eosinophils Absolute: 0.1 10*3/uL (ref 0–0.7)
Eosinophils Relative: 6 %
HCT: 26.4 % — ABNORMAL LOW (ref 40.0–52.0)
Hemoglobin: 9.2 g/dL — ABNORMAL LOW (ref 13.0–18.0)
Lymphocytes Relative: 35 %
Lymphs Abs: 0.6 10*3/uL — ABNORMAL LOW (ref 1.0–3.6)
MCH: 37.7 pg — ABNORMAL HIGH (ref 26.0–34.0)
MCHC: 34.9 g/dL (ref 32.0–36.0)
MCV: 108 fL — ABNORMAL HIGH (ref 80.0–100.0)
Monocytes Absolute: 0.1 10*3/uL — ABNORMAL LOW (ref 0.2–1.0)
Monocytes Relative: 7 %
Neutro Abs: 0.9 10*3/uL — ABNORMAL LOW (ref 1.4–6.5)
Neutrophils Relative %: 50 %
Platelets: 87 10*3/uL — ABNORMAL LOW (ref 150–440)
RBC: 2.45 MIL/uL — ABNORMAL LOW (ref 4.40–5.90)
RDW: 18.6 % — ABNORMAL HIGH (ref 11.5–14.5)
WBC: 1.8 10*3/uL — ABNORMAL LOW (ref 3.8–10.6)

## 2016-10-12 ENCOUNTER — Telehealth: Payer: Self-pay | Admitting: Hematology and Oncology

## 2016-10-12 ENCOUNTER — Other Ambulatory Visit: Payer: Self-pay | Admitting: Hematology and Oncology

## 2016-10-12 DIAGNOSIS — D46Z Other myelodysplastic syndromes: Secondary | ICD-10-CM

## 2016-10-12 DIAGNOSIS — C9000 Multiple myeloma not having achieved remission: Secondary | ICD-10-CM

## 2016-10-12 NOTE — Telephone Encounter (Signed)
Re:  Blood counts.  Multiple attempts were made to contact the patient unsuccessfully.  Finally, I was able to reach the patient today to discuss his counts from yesterday.  His hematocrit has improved.  His platelet count is good.  His WBC has not recovered.  He is neutropenic.  I discussed neutropenic precautions.  He stated that he started his next cycle of Revlimid already today.  I told him to stop.  He is to take no more until his counts recover.  He will have a CBC with diff in 1 week.  The nurse will call him with the results.  He is not to restart Revlimid until directed by MD.  Lequita Asal, MD

## 2016-10-14 ENCOUNTER — Other Ambulatory Visit: Payer: Self-pay | Admitting: *Deleted

## 2016-10-14 ENCOUNTER — Telehealth: Payer: Self-pay | Admitting: *Deleted

## 2016-10-14 NOTE — Telephone Encounter (Signed)
Called patient and he reported that he had not taken his revlimid since he spoke with Dr. [name] on Saturday.  MD informed. Patient instructed to stay away from sick people, wash his hands frequently and report any fevers >100.5 to clinic voiced understanding.

## 2016-10-18 ENCOUNTER — Other Ambulatory Visit: Payer: Medicare Other

## 2016-10-18 ENCOUNTER — Ambulatory Visit: Payer: Medicare Other | Admitting: Hematology and Oncology

## 2016-10-18 ENCOUNTER — Inpatient Hospital Stay: Payer: Medicare Other | Attending: Hematology and Oncology

## 2016-10-18 ENCOUNTER — Inpatient Hospital Stay: Payer: Medicare Other

## 2016-10-18 DIAGNOSIS — E538 Deficiency of other specified B group vitamins: Secondary | ICD-10-CM | POA: Diagnosis not present

## 2016-10-18 DIAGNOSIS — R0602 Shortness of breath: Secondary | ICD-10-CM | POA: Diagnosis not present

## 2016-10-18 DIAGNOSIS — E785 Hyperlipidemia, unspecified: Secondary | ICD-10-CM | POA: Diagnosis not present

## 2016-10-18 DIAGNOSIS — D649 Anemia, unspecified: Secondary | ICD-10-CM | POA: Diagnosis not present

## 2016-10-18 DIAGNOSIS — Z86010 Personal history of colonic polyps: Secondary | ICD-10-CM | POA: Insufficient documentation

## 2016-10-18 DIAGNOSIS — Z87891 Personal history of nicotine dependence: Secondary | ICD-10-CM | POA: Insufficient documentation

## 2016-10-18 DIAGNOSIS — Z8719 Personal history of other diseases of the digestive system: Secondary | ICD-10-CM | POA: Diagnosis not present

## 2016-10-18 DIAGNOSIS — N400 Enlarged prostate without lower urinary tract symptoms: Secondary | ICD-10-CM | POA: Diagnosis not present

## 2016-10-18 DIAGNOSIS — Z7984 Long term (current) use of oral hypoglycemic drugs: Secondary | ICD-10-CM | POA: Insufficient documentation

## 2016-10-18 DIAGNOSIS — Z7982 Long term (current) use of aspirin: Secondary | ICD-10-CM | POA: Diagnosis not present

## 2016-10-18 DIAGNOSIS — Z8619 Personal history of other infectious and parasitic diseases: Secondary | ICD-10-CM | POA: Diagnosis not present

## 2016-10-18 DIAGNOSIS — Z85828 Personal history of other malignant neoplasm of skin: Secondary | ICD-10-CM | POA: Insufficient documentation

## 2016-10-18 DIAGNOSIS — I10 Essential (primary) hypertension: Secondary | ICD-10-CM | POA: Diagnosis not present

## 2016-10-18 DIAGNOSIS — E11319 Type 2 diabetes mellitus with unspecified diabetic retinopathy without macular edema: Secondary | ICD-10-CM | POA: Diagnosis not present

## 2016-10-18 DIAGNOSIS — R011 Cardiac murmur, unspecified: Secondary | ICD-10-CM | POA: Diagnosis not present

## 2016-10-18 DIAGNOSIS — Z79899 Other long term (current) drug therapy: Secondary | ICD-10-CM | POA: Insufficient documentation

## 2016-10-18 DIAGNOSIS — Z882 Allergy status to sulfonamides status: Secondary | ICD-10-CM | POA: Insufficient documentation

## 2016-10-18 DIAGNOSIS — C9140 Hairy cell leukemia not having achieved remission: Secondary | ICD-10-CM | POA: Insufficient documentation

## 2016-10-18 DIAGNOSIS — C9141 Hairy cell leukemia, in remission: Secondary | ICD-10-CM

## 2016-10-18 DIAGNOSIS — D72819 Decreased white blood cell count, unspecified: Secondary | ICD-10-CM | POA: Diagnosis not present

## 2016-10-18 DIAGNOSIS — C9000 Multiple myeloma not having achieved remission: Secondary | ICD-10-CM | POA: Diagnosis not present

## 2016-10-18 DIAGNOSIS — K219 Gastro-esophageal reflux disease without esophagitis: Secondary | ICD-10-CM | POA: Diagnosis not present

## 2016-10-18 DIAGNOSIS — Z803 Family history of malignant neoplasm of breast: Secondary | ICD-10-CM | POA: Diagnosis not present

## 2016-10-18 DIAGNOSIS — Z8701 Personal history of pneumonia (recurrent): Secondary | ICD-10-CM | POA: Diagnosis not present

## 2016-10-18 DIAGNOSIS — D46Z Other myelodysplastic syndromes: Secondary | ICD-10-CM

## 2016-10-18 LAB — CBC WITH DIFFERENTIAL/PLATELET
Basophils Absolute: 0.1 10*3/uL (ref 0–0.1)
Basophils Relative: 3 %
Eosinophils Absolute: 0.1 10*3/uL (ref 0–0.7)
Eosinophils Relative: 5 %
HCT: 28.2 % — ABNORMAL LOW (ref 40.0–52.0)
Hemoglobin: 9.9 g/dL — ABNORMAL LOW (ref 13.0–18.0)
Lymphocytes Relative: 24 %
Lymphs Abs: 0.5 10*3/uL — ABNORMAL LOW (ref 1.0–3.6)
MCH: 37.7 pg — ABNORMAL HIGH (ref 26.0–34.0)
MCHC: 35 g/dL (ref 32.0–36.0)
MCV: 107.7 fL — ABNORMAL HIGH (ref 80.0–100.0)
Monocytes Absolute: 0.1 10*3/uL — ABNORMAL LOW (ref 0.2–1.0)
Monocytes Relative: 5 %
Neutro Abs: 1.3 10*3/uL — ABNORMAL LOW (ref 1.4–6.5)
Neutrophils Relative %: 63 %
Platelets: 90 10*3/uL — ABNORMAL LOW (ref 150–440)
RBC: 2.62 MIL/uL — ABNORMAL LOW (ref 4.40–5.90)
RDW: 17.5 % — ABNORMAL HIGH (ref 11.5–14.5)
WBC: 2 10*3/uL — ABNORMAL LOW (ref 3.8–10.6)

## 2016-10-18 LAB — COMPREHENSIVE METABOLIC PANEL
ALT: 20 U/L (ref 17–63)
AST: 21 U/L (ref 15–41)
Albumin: 3.8 g/dL (ref 3.5–5.0)
Alkaline Phosphatase: 51 U/L (ref 38–126)
Anion gap: 8 (ref 5–15)
BUN: 30 mg/dL — ABNORMAL HIGH (ref 6–20)
CO2: 23 mmol/L (ref 22–32)
Calcium: 8.8 mg/dL — ABNORMAL LOW (ref 8.9–10.3)
Chloride: 107 mmol/L (ref 101–111)
Creatinine, Ser: 1.2 mg/dL (ref 0.61–1.24)
GFR calc Af Amer: >60 mL/min (ref >60)
GFR calc non Af Amer: 56 mL/min — ABNORMAL LOW (ref >60)
Glucose, Bld: 243 mg/dL — ABNORMAL HIGH (ref 65–99)
Potassium: 4.3 mmol/L (ref 3.5–5.1)
Sodium: 138 mmol/L (ref 135–145)
Total Bilirubin: 0.7 mg/dL (ref 0.3–1.2)
Total Protein: 6.9 g/dL (ref 6.5–8.1)

## 2016-10-18 LAB — SAMPLE TO BLOOD BANK

## 2016-11-01 ENCOUNTER — Other Ambulatory Visit: Payer: Medicare Other

## 2016-11-01 ENCOUNTER — Inpatient Hospital Stay: Payer: Medicare Other

## 2016-11-01 ENCOUNTER — Ambulatory Visit: Payer: Medicare Other | Admitting: Hematology and Oncology

## 2016-11-04 ENCOUNTER — Inpatient Hospital Stay (HOSPITAL_BASED_OUTPATIENT_CLINIC_OR_DEPARTMENT_OTHER): Payer: Medicare Other | Admitting: Hematology and Oncology

## 2016-11-04 ENCOUNTER — Inpatient Hospital Stay: Payer: Medicare Other

## 2016-11-04 ENCOUNTER — Telehealth: Payer: Self-pay | Admitting: *Deleted

## 2016-11-04 ENCOUNTER — Encounter: Payer: Self-pay | Admitting: Hematology and Oncology

## 2016-11-04 ENCOUNTER — Other Ambulatory Visit: Payer: Self-pay | Admitting: *Deleted

## 2016-11-04 VITALS — HR 75 | Temp 97.6°F | Resp 18 | Ht 65.5 in | Wt 217.1 lb

## 2016-11-04 DIAGNOSIS — D46Z Other myelodysplastic syndromes: Secondary | ICD-10-CM

## 2016-11-04 DIAGNOSIS — Z86010 Personal history of colonic polyps: Secondary | ICD-10-CM

## 2016-11-04 DIAGNOSIS — Z95828 Presence of other vascular implants and grafts: Secondary | ICD-10-CM

## 2016-11-04 DIAGNOSIS — Z8619 Personal history of other infectious and parasitic diseases: Secondary | ICD-10-CM

## 2016-11-04 DIAGNOSIS — C9141 Hairy cell leukemia, in remission: Secondary | ICD-10-CM

## 2016-11-04 DIAGNOSIS — N400 Enlarged prostate without lower urinary tract symptoms: Secondary | ICD-10-CM | POA: Diagnosis not present

## 2016-11-04 DIAGNOSIS — E538 Deficiency of other specified B group vitamins: Secondary | ICD-10-CM | POA: Diagnosis not present

## 2016-11-04 DIAGNOSIS — C9000 Multiple myeloma not having achieved remission: Secondary | ICD-10-CM | POA: Diagnosis not present

## 2016-11-04 DIAGNOSIS — Z882 Allergy status to sulfonamides status: Secondary | ICD-10-CM | POA: Diagnosis not present

## 2016-11-04 DIAGNOSIS — Z803 Family history of malignant neoplasm of breast: Secondary | ICD-10-CM | POA: Diagnosis not present

## 2016-11-04 DIAGNOSIS — D72819 Decreased white blood cell count, unspecified: Secondary | ICD-10-CM

## 2016-11-04 DIAGNOSIS — C9140 Hairy cell leukemia not having achieved remission: Secondary | ICD-10-CM | POA: Diagnosis not present

## 2016-11-04 DIAGNOSIS — Z8701 Personal history of pneumonia (recurrent): Secondary | ICD-10-CM

## 2016-11-04 DIAGNOSIS — Z7982 Long term (current) use of aspirin: Secondary | ICD-10-CM

## 2016-11-04 DIAGNOSIS — Z87891 Personal history of nicotine dependence: Secondary | ICD-10-CM | POA: Diagnosis not present

## 2016-11-04 DIAGNOSIS — I10 Essential (primary) hypertension: Secondary | ICD-10-CM

## 2016-11-04 DIAGNOSIS — Z79899 Other long term (current) drug therapy: Secondary | ICD-10-CM

## 2016-11-04 DIAGNOSIS — Z85828 Personal history of other malignant neoplasm of skin: Secondary | ICD-10-CM | POA: Diagnosis not present

## 2016-11-04 DIAGNOSIS — D649 Anemia, unspecified: Secondary | ICD-10-CM | POA: Diagnosis not present

## 2016-11-04 DIAGNOSIS — R011 Cardiac murmur, unspecified: Secondary | ICD-10-CM | POA: Diagnosis not present

## 2016-11-04 DIAGNOSIS — D708 Other neutropenia: Secondary | ICD-10-CM

## 2016-11-04 DIAGNOSIS — Z8719 Personal history of other diseases of the digestive system: Secondary | ICD-10-CM | POA: Diagnosis not present

## 2016-11-04 DIAGNOSIS — E785 Hyperlipidemia, unspecified: Secondary | ICD-10-CM

## 2016-11-04 DIAGNOSIS — K219 Gastro-esophageal reflux disease without esophagitis: Secondary | ICD-10-CM

## 2016-11-04 DIAGNOSIS — Z7984 Long term (current) use of oral hypoglycemic drugs: Secondary | ICD-10-CM

## 2016-11-04 DIAGNOSIS — R0602 Shortness of breath: Secondary | ICD-10-CM

## 2016-11-04 DIAGNOSIS — E11319 Type 2 diabetes mellitus with unspecified diabetic retinopathy without macular edema: Secondary | ICD-10-CM | POA: Diagnosis not present

## 2016-11-04 DIAGNOSIS — D472 Monoclonal gammopathy: Secondary | ICD-10-CM

## 2016-11-04 LAB — CBC WITH DIFFERENTIAL/PLATELET
Basophils Absolute: 0 10*3/uL (ref 0–0.1)
Basophils Relative: 1 %
Eosinophils Absolute: 0.1 10*3/uL (ref 0–0.7)
Eosinophils Relative: 8 %
HCT: 24.5 % — ABNORMAL LOW (ref 40.0–52.0)
Hemoglobin: 8.8 g/dL — ABNORMAL LOW (ref 13.0–18.0)
Lymphocytes Relative: 33 %
Lymphs Abs: 0.6 10*3/uL — ABNORMAL LOW (ref 1.0–3.6)
MCH: 38.1 pg — ABNORMAL HIGH (ref 26.0–34.0)
MCHC: 35.8 g/dL (ref 32.0–36.0)
MCV: 106.2 fL — ABNORMAL HIGH (ref 80.0–100.0)
Monocytes Absolute: 0.2 10*3/uL (ref 0.2–1.0)
Monocytes Relative: 10 %
Neutro Abs: 0.9 10*3/uL — ABNORMAL LOW (ref 1.4–6.5)
Neutrophils Relative %: 48 %
Platelets: 70 10*3/uL — ABNORMAL LOW (ref 150–440)
RBC: 2.31 MIL/uL — ABNORMAL LOW (ref 4.40–5.90)
RDW: 16.1 % — ABNORMAL HIGH (ref 11.5–14.5)
WBC: 1.8 10*3/uL — ABNORMAL LOW (ref 3.8–10.6)

## 2016-11-04 MED ORDER — SODIUM CHLORIDE 0.9% FLUSH
10.0000 mL | INTRAVENOUS | Status: DC | PRN
  Administered 2016-11-04: 10 mL via INTRAVENOUS
  Filled 2016-11-04: qty 10

## 2016-11-04 MED ORDER — HEPARIN SOD (PORK) LOCK FLUSH 100 UNIT/ML IV SOLN
500.0000 [IU] | Freq: Once | INTRAVENOUS | Status: AC
  Administered 2016-11-04: 500 [IU] via INTRAVENOUS

## 2016-11-04 NOTE — Telephone Encounter (Signed)
Patient seen in clinic this morning.  When he left he was questioning about restarting his Revlimid.  Spoke with MD who states she told the patient that he is neutropenic and should not start Revlimid at this time.  Patient will beginning Neupogen injections.  If he responds well the Revlimid can be resarted at a later date.  Called patient and reinterated MD instructions.

## 2016-11-04 NOTE — Progress Notes (Signed)
Pt  Doing  Good today until he heard his hgb had dropped. Still holding on revlimid per md.

## 2016-11-04 NOTE — Progress Notes (Signed)
[facility] day:  11/04/2016  Chief Complaint: Alex Smith is a 82 y.o. male with a myelodysplastic marrow, smoldering myeloma, and a history of hairy cell leukemia who is seen for reassessment off Revlimid.  HPI:  The patient was last seen in the medical oncology clinic on 09/20/2016.  At that time, he felt great one day and not the next (unrelated to Decadron).  Exam was stable.  Counts include a hematocrit 25.1, hemoglobin of 8.7, platelets 83,000 and ANC 2,300.  He continued Revlimid 3 weeks on/1 week off.  He had follow-up weekly counts.  He was contacted on 10/12/2016 after multiple attempts.  CBC on 10/11/2016 revealed a hematocrit of 26.4, hemoglobin 9.2, platelets 87,000, WBC 1800 with an ANC of 900.  Revlimid was held.  CBC on 10/18/2016 revealed an [place] of 1300.  Symptomatically, he notes no problems.  He denies any fevers or infections.  He denies any bruising or bleeding.  He remains off Revlimid.   Past Medical History:  Diagnosis Date  . Anemia   . B12 deficiency   . Basal cell carcinoma of face   . BPH (benign prostatic hypertrophy)    followed by urology, discharged (Dr. Bernardo Heater)  . CAP (community acquired pneumonia) 02/15/2015  . Colon polyps   . Diverticulosis   . GERD (gastroesophageal reflux disease)   . Hairy cell leukemia ([place]) 2006   recurrent, seizure on rituxan, now on cladribine ([place])  . History of pneumonia 2000's   "once" (07/07/2012)  . History of shingles   . HLD (hyperlipidemia)   . Hypertension   . Pneumonia   . Shortness of breath dyspnea   . Systolic murmur 13/03/6577  . Type 2 diabetes, controlled, with retinopathy ([place])     Past Surgical History:  Procedure Laterality Date  . [place] VITRECTOMY WITH 20 GAUGE MVR PORT FOR MACULAR HOLE  07/07/2012   Procedure: 25 GAUGE PARS PLANA VITRECTOMY WITH 20 GAUGE MVR PORT FOR MACULAR HOLE;  Surgeon: Hayden Pedro, MD;  Location: [facility];   Service: Ophthalmology;  Laterality: Left;  . BONE MARROW BIOPSY  2016  . CARDIOVASCULAR STRESS TEST  2013   treadmill - no evidence ischemia, EF 61%  . CATARACT EXTRACTION W/ INTRAOCULAR LENS  IMPLANT, BILATERAL  ~ 2010  . COLONOSCOPY  2014   [place] WNL no rpt needed, h/o polyps  . EYE SURGERY Left 06/2012   laser surgery  . GAS INSERTION  07/07/2012   Procedure: INSERTION OF GAS;  Surgeon: Hayden Pedro, MD;  Location: [facility];  Service: Ophthalmology;  Laterality: Left;  [id number]  . PERIPHERAL VASCULAR CATHETERIZATION N/A 02/23/2015   Procedure: Glori Luis Cath Insertion;  Surgeon: Algernon Huxley, MD;  Location: [facility];  Service: Cardiovascular;  Laterality: N/A;  . SERUM PATCH  07/07/2012   Procedure: SERUM PATCH;  Surgeon: Hayden Pedro, MD;  Location: [facility];  Service: Ophthalmology;  Laterality: Left;  . SKIN CANCER EXCISION     "all over my face" (07/07/2012)    Family History  Problem Relation Age of Onset  . Dementia Mother   . Heart failure Father 33  . Cancer Sister     breast  . Diabetes Paternal Uncle   . Diabetes Paternal Aunt   . CAD Brother 29    MI  . Stroke Neg Hx     Social History:  reports that he quit smoking about 47 years ago.  His smoking use included Cigarettes. He has a 50.00 pack-year smoking history. He has never used smokeless tobacco. He reports that he drinks alcohol. He reports that he does not use drugs.  He works at [employer] 20-32 hours/week.  The patient 's wife has had 3 hip operations.  She underwent hip replacement 03/06/2016.  She recently hurt her shoulder.  He takes care of his wife.  He states that they either eat out or bring food in.  His best contact number is his cell: [phone].  He is alone today.  Allergies:  Allergies  Allergen Reactions  . Rituximab Rash    Chest tightness  . Blood-Group Specific Substance Other (See Comments)    Had a post transfusion reaction of red blood cells; NOW REQUIRES WASHED BLOOD CELLS  .  Primaxin [Imipenem] Other (See Comments)    Possible allergy  . Sulfa Antibiotics Itching and Rash  Possible allergy to Zoloft, allopurinol, and a chemotherapy medication.  He tolerates Dapsone.  Current Medications: Current Outpatient Prescriptions  Medication Sig Dispense Refill  . aspirin EC 81 MG tablet Take 81 mg by mouth daily.    Marland Kitchen glucose blood (ACCU-CHEK AVIVA PLUS) test strip Use to check sugar twice daily Dx:E11.9 200 each 3  . HUMULIN R 100 UNIT/ML injection INJECT 10 UNITS SUBCUTANEOUSLY TWICE DAILY BEFORE MEALS 10 mL 6  . hydrochlorothiazide (HYDRODIURIL) 25 MG tablet TAKE ONE TABLET EVERY DAY 30 tablet 5  . insulin NPH Human (HUMULIN N) 100 UNIT/ML injection Inject 0.39 mLs (39 Units total) into the skin 2 (two) times daily before a meal.    . lidocaine-prilocaine (EMLA) cream Apply 1 application topically as needed. 30 g 3  . metFORMIN (GLUCOPHAGE) 1000 MG tablet TAKE ONE TABLET BY MOUTH TWICE DAILY AS DIRECTED 180 tablet 3  . MICROLET LANCETS MISC USE AS DIRECTED 200 each 3  . ranitidine (ZANTAC) 150 MG tablet TAKE 1 TABLET BY MOUTH TWICE DAILY AS NEEDED 180 tablet 3  . tamsulosin (FLOMAX) 0.4 MG CAPS capsule Take 2 capsules (0.8 mg total) by mouth daily. 60 capsule 1  . temazepam (RESTORIL) 7.5 MG capsule Take 7.5 mg by mouth once.     . traZODone (DESYREL) 50 MG tablet Take 0.5-1 tablets (25-50 mg total) by mouth at bedtime as needed for sleep. 30 tablet 3  . vitamin B-12 (CYANOCOBALAMIN) 1000 MCG tablet Take 1,000 mcg by mouth daily.    . vitamin E 400 UNIT capsule Take 200 Units by mouth daily.     Marland Kitchen acyclovir (ZOVIRAX) 400 MG tablet TAKE TWO TABLETS TWICE DAILY 120 tablet 6  . dexamethasone (DECADRON) 4 MG tablet Take 5 tablets (20 mg total) by mouth once a week. 20 tablet 1  . lenalidomide (REVLIMID) 5 MG capsule 5 mg a day for 21 days then 7 days off 21 capsule 0  . lisinopril (PRINIVIL,ZESTRIL) 20 MG tablet TAKE 1 TABLET BY MOUTH TWICE DAILY 60 tablet 6  .  pravastatin (PRAVACHOL) 20 MG tablet TAKE ONE TABLET AT BEDTIME 30 tablet 6   No current facility-administered medications for this visit.    Facility-Administered Medications Ordered in Other Visits  Medication Dose Route Frequency Provider Last Rate Last Dose  . heparin lock flush 100 unit/mL  500 Units Intravenous Once Lequita Asal, MD      . sodium chloride 0.9 % injection 10 mL  10 mL Intravenous PRN Lequita Asal, MD   10 mL at 03/03/15 0903  . sodium chloride 0.9 %  injection 10 mL  10 mL Intracatheter PRN Lequita Asal, MD   10 mL at 03/10/15 1410   Review of Systems:  GENERAL:  Feels "ok".  No problems.  No fevers or sweats.  Weight down 3 pounds. PERFORMANCE STATUS (ECOG):  1 HEENT: No visual changes, runny nose, sore throat. Pulmonary:  Chronic shortness of breath on exertion.  No cough.  No hemoptysis. Cardiac:  No chest pain, palpitations, orthopnea, or PND. GI:  Appetite good.  No nausea, vomiting, diarrhea, constipation, melena or hematochezia.  GU:  No urgency, frequency, dysuria, or hematuria.  On Flomax. Musculoskeletal:  No back pain.  No joint pain.  No muscle tenderness. Extremities:  No pain or swelling. Skin:  No rashes or skin changes. Neuro:  No headache, numbness or weakness, balance or coordination issues. Endocrine:  Diabetes.  No thyroid issues, hot flashes or night sweats. Psych:  No mood changes, depression or anxiety.  Pain:  No focal pain. Review of systems:  All other systems reviewed and found to be negative.   Physical Exam: Pulse 75, temperature 97.6 F (36.4 C), temperature source Tympanic, resp. rate 18, height 5' 5.5" (1.664 m), weight 217 lb 1.6 oz (98.5 kg).  GENERAL:  Well developed, well nourished, gentleman sitting comfortably in the exam room in no acute distress. MENTAL STATUS:  Alert and oriented to person, place and time. HEAD:  Pearline Cables white hair.  Normocephalic, atraumatic, face symmetric, no Cushingoid features. EYES:   Glasses. Blue eyes.  Pupils equal round and reactive to light and accomodation.  No conjunctivitis or scleral icterus. ENT:  Oropharynx clear without lesion.  Tongue normal. Mucous membranes moist.  RESPIRATORY:  Clear to auscultation without rales, wheezes or rhonchi. CARDIOVASCULAR:  Regular rate and rhythm without murmur, rub or gallop. ABDOMEN:  Soft, non-tender, with active bowel sounds, and no hepatosplenomegaly.  No masses. SKIN:  No rashes or ulcers. EXTREMITIES: No edema, no skin discoloration or tenderness.  No palpable cords. LYMPH NODES: No palpable cervical, supraclavicular, axillary or inguinal adenopathy  NEUROLOGICAL: Unremarkable. PSYCH:  Appropriate.    Infusion on 11/04/2016  Component Date Value Ref Range Status  . WBC 11/04/2016 1.8* 3.8 - 10.6 K/uL Final  . RBC 11/04/2016 2.31* 4.40 - 5.90 MIL/uL Final  . Hemoglobin 11/04/2016 8.8* 13.0 - 18.0 g/dL Final  . HCT 11/04/2016 24.5* 40.0 - 52.0 % Final  . MCV 11/04/2016 106.2* 80.0 - 100.0 fL Final  . MCH 11/04/2016 38.1* 26.0 - 34.0 pg Final  . MCHC 11/04/2016 35.8  32.0 - 36.0 g/dL Final  . RDW 11/04/2016 16.1* 11.5 - 14.5 % Final  . Platelets 11/04/2016 70* 150 - 440 K/uL Final  . Neutrophils Relative % 11/04/2016 48  % Final  . Neutro Abs 11/04/2016 0.9* 1.4 - 6.5 K/uL Final  . Lymphocytes Relative 11/04/2016 33  % Final  . Lymphs Abs 11/04/2016 0.6* 1.0 - 3.6 K/uL Final  . Monocytes Relative 11/04/2016 10  % Final  . Monocytes Absolute 11/04/2016 0.2  0.2 - 1.0 K/uL Final  . Eosinophils Relative 11/04/2016 8  % Final  . Eosinophils Absolute 11/04/2016 0.1  0 - 0.7 K/uL Final  . Basophils Relative 11/04/2016 1  % Final  . Basophils Absolute 11/04/2016 0.0  0 - 0.1 K/uL Final    Assessment:  Alex Smith is a 82 y.o. 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  He was diagnosed with hairy cell leukemia in 2006.  He received cladribine 11/06-11/05/2005.  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   Bone marrow aspirate and biopsy on 02/07/2015 revealed a extensive marrow involvement by recurrent hairy cell leukemia (approximately 80% of cells in the core). There was a monoclonal plasma cell infiltrate (approximately 10%), compatible with a plasma cell neoplasm. Marrow was hypercellular (40-50%) with markedly diminished residual trilineage hematopoiesis and mild multilineage dyspoiesis.  Peripheral smear revealed leukopenia (WBC 1900) with 2% atypical lymphoid cells compatible with hairy cell leukemia. There was moderate anemia with anisopoikilocytosis. 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  FISH studies for MDS were negative.  Cytogenetics were normal (46, XY).  He has an unclear allergy history.  He may be allergic to Primaxin, Zoloft, allopurinol, and a chemotherapy medication.  Reaction occurred in 06/2005.  He is allergic to sulfa.  He is not allergic to dapsone.  He requires washed RBCs.  He is on prophylactic acyclovir.  Dapsone and voriconazole were discontinued.  He has a history of shingles prior to prophylactic acyclovir.  He is 17 months status post cladribine (began 03/03/2015).  His counts plateaued on 07/25/2015 and have subsequently decreased.  Labs on 07/25/2015 revealed a normal B12 (894), folate (60.5), and TSH (2.562).  Ferritin was 50 on 04/26/2016.  Labs on 08/22/2016 revealed a normal B12, folate, and TSH.  He is on oral B12.  Bone marrow on 11/03/2015 revealed persistent plasma cell neoplasm, now with mildy atypical monoclonal plasma cells estimated at 20-30%.   There was no morphologic evidence of residual hairy cell leukemia.  There was a minute population (0.03%) with hairy cell immunophenotype detected by flow.  Marrow was normocellular for age with relative erythroid hyperplasia, relative myeloid hypoplasia, mild dyspoiesis, adequate megakaryocytes and no increased blasts.   There was no significant increase in marrow reticulin fibers.  Iron was present.  FISH studies for MDS were negative.  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  Cytogenetics were normal (46, XY).  Bone marrow on 03/13/2016 was normocellular to marginally hypercellular for age (20-40%) with relative erythroid hyperplasia and mild dyserythropoiesis, decreased granulopoiesis and adequate megakaryocytes.  There was a persistent 15-20% plasma cell neoplasm.  There was no overt increase in reticulin fibrosis.  Storage iron was present.  Flow cytometry revealed abnormal/monocytic plasma cell population (3% sample), consistent with a plasma cell neoplastic process.  Cytogenetics were normal (46, XY).  FISH studies for myeloma revealed a CCND1/IGH translocation, t(11;14) in 53% of nuclei.  SPEP has been followed: 1.0 on 02/03/2015, 1.4 gm/dL on 07/25/2015, 1.3 gm/dL on 11/17/2015, 1.2 gm/dL on 06/28/2016 and 1.0 gm/dL on 09/20/2016.    Kappa free light chains were 47.63 (ratio 3.12) on 02/03/2015, 54.9 (ratio 3.14) on 02/07/2015, 46.23 (ratio 3.68) on 07/25/2015, and 50.5 (ratio 4.82) on 11/17/2015.  Beta2-microglobulin was 2.5 on 11/17/2015.  24 hour urine on 11/21/2015 revealed 234.8 mg protein in 24 hours (no monoclonal protein).  Bone survey on 11/24/2015 revealed no lytic lesions.  Epo level was 329.6 on 03/29/2016.  He began a trial of Procrit.  He received 10,000 units of Procrit weekly (04/26/2016 - 06/14/2016).  Hemoglobin ranged between 8.4 and 8.9 without trend.    He is s/p 1 cycle of Revlimid (10 mg) and Decadron (06/29/2016 - 07/19/2016).  Cycle #1 was complicated by anemia and neutropenia.  He has received 3 units of PRBCs (1 unit:07/22/2016 and 2 units: 08/07/2016).  He received 6 days of GCSF (last  08/05/2016).  He is s/p 2 cycles of Revlimid (76m) and Decadron (09/14/2016).  Cycle #2 was truncated secondary to neutropenia.  He is on aspirin 81 mg  a day prophylaxis.  PET scan on 03/14/2016 revealed no evidence of hypermetabolic lymphadenopathy or focal skeletal lesions to suggest marrow or cortical bone involvement.  Symptomatically, he denies any complaint.  Exam is stable.  Counts include a hematocrit 24.5, hemoglobin of 8.8, platelets 70,000 and ANC 900.  Plan: 1.  Labs today:  CBC with diff. 2.  Discuss initial response with Revlimid regarding increasing hemoglobin and decreasing transfusion support.  Discuss issues with leukopenia.  Discuss trial of weekly GCSF/Neupogen to allow continued Revlimid.  If counts problematic, may consider twice weekly Neupogen. 3.  Preauth GCSF 480 mcg SQ q week. 4.  RTC after preauth for initiation of GCSF 1-2 x/week.  Begin once a week. 5.  RTC for CBC check 2 days after GCSF to assess response.  If counts improve, begin Revlimid. 6.  RTC on 11/12/2016 for MD assessment and labs (CBC with diff) +/- GCSF.   MLequita Asal MD  11/04/2016

## 2016-11-05 ENCOUNTER — Other Ambulatory Visit: Payer: Self-pay | Admitting: Family Medicine

## 2016-11-05 ENCOUNTER — Inpatient Hospital Stay: Payer: Medicare Other | Attending: Hematology and Oncology

## 2016-11-05 DIAGNOSIS — D469 Myelodysplastic syndrome, unspecified: Secondary | ICD-10-CM | POA: Insufficient documentation

## 2016-11-05 DIAGNOSIS — Z7689 Persons encountering health services in other specified circumstances: Secondary | ICD-10-CM | POA: Diagnosis not present

## 2016-11-05 DIAGNOSIS — D46Z Other myelodysplastic syndromes: Secondary | ICD-10-CM

## 2016-11-05 MED ORDER — TBO-FILGRASTIM 480 MCG/0.8ML ~~LOC~~ SOSY
480.0000 ug | PREFILLED_SYRINGE | Freq: Once | SUBCUTANEOUS | Status: AC
  Administered 2016-11-05: 480 ug via SUBCUTANEOUS
  Filled 2016-11-05: qty 0.8

## 2016-11-05 MED ORDER — FILGRASTIM 480 MCG/0.8ML IJ SOSY: 480.0000 ug | PREFILLED_SYRINGE | Freq: Once | INTRAMUSCULAR | Status: DC

## 2016-11-07 ENCOUNTER — Telehealth: Payer: Self-pay | Admitting: *Deleted

## 2016-11-07 ENCOUNTER — Inpatient Hospital Stay: Payer: Medicare Other | Attending: Hematology and Oncology

## 2016-11-07 DIAGNOSIS — Z85828 Personal history of other malignant neoplasm of skin: Secondary | ICD-10-CM | POA: Diagnosis not present

## 2016-11-07 DIAGNOSIS — E785 Hyperlipidemia, unspecified: Secondary | ICD-10-CM | POA: Diagnosis not present

## 2016-11-07 DIAGNOSIS — Z8619 Personal history of other infectious and parasitic diseases: Secondary | ICD-10-CM | POA: Insufficient documentation

## 2016-11-07 DIAGNOSIS — C9141 Hairy cell leukemia, in remission: Secondary | ICD-10-CM | POA: Diagnosis not present

## 2016-11-07 DIAGNOSIS — D649 Anemia, unspecified: Secondary | ICD-10-CM | POA: Insufficient documentation

## 2016-11-07 DIAGNOSIS — Z87891 Personal history of nicotine dependence: Secondary | ICD-10-CM | POA: Diagnosis not present

## 2016-11-07 DIAGNOSIS — K219 Gastro-esophageal reflux disease without esophagitis: Secondary | ICD-10-CM | POA: Insufficient documentation

## 2016-11-07 DIAGNOSIS — Z86010 Personal history of colonic polyps: Secondary | ICD-10-CM | POA: Diagnosis not present

## 2016-11-07 DIAGNOSIS — E538 Deficiency of other specified B group vitamins: Secondary | ICD-10-CM | POA: Diagnosis not present

## 2016-11-07 DIAGNOSIS — K5790 Diverticulosis of intestine, part unspecified, without perforation or abscess without bleeding: Secondary | ICD-10-CM | POA: Insufficient documentation

## 2016-11-07 DIAGNOSIS — Z8701 Personal history of pneumonia (recurrent): Secondary | ICD-10-CM | POA: Diagnosis not present

## 2016-11-07 DIAGNOSIS — Z7984 Long term (current) use of oral hypoglycemic drugs: Secondary | ICD-10-CM | POA: Insufficient documentation

## 2016-11-07 DIAGNOSIS — N400 Enlarged prostate without lower urinary tract symptoms: Secondary | ICD-10-CM | POA: Diagnosis not present

## 2016-11-07 DIAGNOSIS — Z79899 Other long term (current) drug therapy: Secondary | ICD-10-CM | POA: Insufficient documentation

## 2016-11-07 DIAGNOSIS — E119 Type 2 diabetes mellitus without complications: Secondary | ICD-10-CM | POA: Diagnosis not present

## 2016-11-07 DIAGNOSIS — C9000 Multiple myeloma not having achieved remission: Secondary | ICD-10-CM | POA: Diagnosis not present

## 2016-11-07 DIAGNOSIS — Z7982 Long term (current) use of aspirin: Secondary | ICD-10-CM | POA: Insufficient documentation

## 2016-11-07 DIAGNOSIS — I10 Essential (primary) hypertension: Secondary | ICD-10-CM | POA: Insufficient documentation

## 2016-11-07 DIAGNOSIS — Z803 Family history of malignant neoplasm of breast: Secondary | ICD-10-CM | POA: Insufficient documentation

## 2016-11-07 DIAGNOSIS — R0602 Shortness of breath: Secondary | ICD-10-CM | POA: Diagnosis not present

## 2016-11-07 LAB — CBC WITH DIFFERENTIAL/PLATELET
Basophils Absolute: 0 10*3/uL (ref 0–0.1)
Basophils Relative: 1 %
Eosinophils Absolute: 0.1 10*3/uL (ref 0–0.7)
Eosinophils Relative: 4 %
HCT: 23.4 % — ABNORMAL LOW (ref 40.0–52.0)
Hemoglobin: 8.1 g/dL — ABNORMAL LOW (ref 13.0–18.0)
Lymphocytes Relative: 19 %
Lymphs Abs: 0.6 10*3/uL — ABNORMAL LOW (ref 1.0–3.6)
MCH: 37.7 pg — ABNORMAL HIGH (ref 26.0–34.0)
MCHC: 34.8 g/dL (ref 32.0–36.0)
MCV: 108.4 fL — ABNORMAL HIGH (ref 80.0–100.0)
Monocytes Absolute: 0.3 10*3/uL (ref 0.2–1.0)
Monocytes Relative: 10 %
Neutro Abs: 2 10*3/uL (ref 1.4–6.5)
Neutrophils Relative %: 66 %
Platelets: 60 10*3/uL — ABNORMAL LOW (ref 150–440)
RBC: 2.16 MIL/uL — ABNORMAL LOW (ref 4.40–5.90)
RDW: 16.1 % — ABNORMAL HIGH (ref 11.5–14.5)
WBC: 3 10*3/uL — ABNORMAL LOW (ref 3.8–10.6)

## 2016-11-07 NOTE — Telephone Encounter (Signed)
Informed patient that his labs looked good and that he would get labs and possible shot on Tuesday and to restart his revlimid today or tomorrow per Dr. [name], voiced understanding.

## 2016-11-12 ENCOUNTER — Inpatient Hospital Stay: Payer: Medicare Other

## 2016-11-12 ENCOUNTER — Inpatient Hospital Stay (HOSPITAL_BASED_OUTPATIENT_CLINIC_OR_DEPARTMENT_OTHER): Payer: Medicare Other | Admitting: Hematology and Oncology

## 2016-11-12 ENCOUNTER — Encounter: Payer: Self-pay | Admitting: Hematology and Oncology

## 2016-11-12 VITALS — BP 145/73 | HR 71 | Temp 97.5°F | Resp 18 | Wt 218.1 lb

## 2016-11-12 DIAGNOSIS — Z7984 Long term (current) use of oral hypoglycemic drugs: Secondary | ICD-10-CM | POA: Diagnosis not present

## 2016-11-12 DIAGNOSIS — K219 Gastro-esophageal reflux disease without esophagitis: Secondary | ICD-10-CM

## 2016-11-12 DIAGNOSIS — Z7982 Long term (current) use of aspirin: Secondary | ICD-10-CM | POA: Diagnosis not present

## 2016-11-12 DIAGNOSIS — D649 Anemia, unspecified: Secondary | ICD-10-CM

## 2016-11-12 DIAGNOSIS — K5790 Diverticulosis of intestine, part unspecified, without perforation or abscess without bleeding: Secondary | ICD-10-CM

## 2016-11-12 DIAGNOSIS — I10 Essential (primary) hypertension: Secondary | ICD-10-CM | POA: Diagnosis not present

## 2016-11-12 DIAGNOSIS — Z803 Family history of malignant neoplasm of breast: Secondary | ICD-10-CM | POA: Diagnosis not present

## 2016-11-12 DIAGNOSIS — Z8619 Personal history of other infectious and parasitic diseases: Secondary | ICD-10-CM

## 2016-11-12 DIAGNOSIS — C9141 Hairy cell leukemia, in remission: Secondary | ICD-10-CM

## 2016-11-12 DIAGNOSIS — D46Z Other myelodysplastic syndromes: Secondary | ICD-10-CM

## 2016-11-12 DIAGNOSIS — E538 Deficiency of other specified B group vitamins: Secondary | ICD-10-CM | POA: Diagnosis not present

## 2016-11-12 DIAGNOSIS — C9000 Multiple myeloma not having achieved remission: Secondary | ICD-10-CM

## 2016-11-12 DIAGNOSIS — Z79899 Other long term (current) drug therapy: Secondary | ICD-10-CM | POA: Diagnosis not present

## 2016-11-12 DIAGNOSIS — E119 Type 2 diabetes mellitus without complications: Secondary | ICD-10-CM

## 2016-11-12 DIAGNOSIS — N400 Enlarged prostate without lower urinary tract symptoms: Secondary | ICD-10-CM

## 2016-11-12 DIAGNOSIS — Z8701 Personal history of pneumonia (recurrent): Secondary | ICD-10-CM

## 2016-11-12 DIAGNOSIS — Z86010 Personal history of colonic polyps: Secondary | ICD-10-CM | POA: Diagnosis not present

## 2016-11-12 DIAGNOSIS — D472 Monoclonal gammopathy: Secondary | ICD-10-CM

## 2016-11-12 DIAGNOSIS — R0602 Shortness of breath: Secondary | ICD-10-CM | POA: Diagnosis not present

## 2016-11-12 DIAGNOSIS — Z85828 Personal history of other malignant neoplasm of skin: Secondary | ICD-10-CM | POA: Diagnosis not present

## 2016-11-12 DIAGNOSIS — E785 Hyperlipidemia, unspecified: Secondary | ICD-10-CM

## 2016-11-12 DIAGNOSIS — Z87891 Personal history of nicotine dependence: Secondary | ICD-10-CM | POA: Diagnosis not present

## 2016-11-12 LAB — CBC WITH DIFFERENTIAL/PLATELET
Basophils Absolute: 0 10*3/uL (ref 0–0.1)
Basophils Relative: 1 %
Eosinophils Absolute: 0.1 10*3/uL (ref 0–0.7)
Eosinophils Relative: 4 %
HCT: 23.5 % — ABNORMAL LOW (ref 40.0–52.0)
Hemoglobin: 8.3 g/dL — ABNORMAL LOW (ref 13.0–18.0)
Lymphocytes Relative: 23 %
Lymphs Abs: 0.5 10*3/uL — ABNORMAL LOW (ref 1.0–3.6)
MCH: 38.4 pg — ABNORMAL HIGH (ref 26.0–34.0)
MCHC: 35.3 g/dL (ref 32.0–36.0)
MCV: 109 fL — ABNORMAL HIGH (ref 80.0–100.0)
Monocytes Absolute: 0.1 10*3/uL — ABNORMAL LOW (ref 0.2–1.0)
Monocytes Relative: 5 %
Neutro Abs: 1.5 10*3/uL (ref 1.4–6.5)
Neutrophils Relative %: 67 %
Platelets: 63 10*3/uL — ABNORMAL LOW (ref 150–440)
RBC: 2.16 MIL/uL — ABNORMAL LOW (ref 4.40–5.90)
RDW: 17 % — ABNORMAL HIGH (ref 11.5–14.5)
WBC: 2.2 10*3/uL — ABNORMAL LOW (ref 3.8–10.6)

## 2016-11-12 LAB — SAMPLE TO BLOOD BANK

## 2016-11-12 MED ORDER — TBO-FILGRASTIM 480 MCG/0.8ML ~~LOC~~ SOSY
480.0000 ug | PREFILLED_SYRINGE | Freq: Once | SUBCUTANEOUS | Status: AC
  Administered 2016-11-12: 480 ug via SUBCUTANEOUS
  Filled 2016-11-12: qty 0.8

## 2016-11-12 MED ORDER — FILGRASTIM 480 MCG/0.8ML IJ SOSY: 480.0000 ug | PREFILLED_SYRINGE | Freq: Once | INTRAMUSCULAR | Status: DC

## 2016-11-12 NOTE — Progress Notes (Signed)
[facility] day:  11/12/2016   Chief Complaint: Alex Smith is a 82 y.o. male with a myelodysplastic marrow, smoldering myeloma, and a history of hairy cell leukemia who is seen for 1 assessment on day 5 of cycle #3 Revlimid.  HPI:  The patient was last seen in the medical oncology clinic on 11/04/2016.  At that time, decision was made to initiate GCSF with Revlimid to allow continuation of therapy.  CBC on 11/04/2016 revealed a hematocrit 24.5, hemoglobin 8.8, MCV 106.2, platelets 70,000, white count 1800 with an ANC of 900.  He received G-CSF 480 g on 11/05/2016.  CBC on 11/07/2016 revealed a hematocrit 23.4, hemoglobin 8.1, platelets 60,000, white count 3000 with an ANC of 2000. He began Revlimid 5 mg a day on 11/08/2016.  Symptomatically, he denies any issues or symptoms.  He denies any bruising or bleeding.  He denies any fevers or infections.  Energy level is stable.   Past Medical History:  Diagnosis Date  . Anemia   . B12 deficiency   . Basal cell carcinoma of face   . BPH (benign prostatic hypertrophy)    followed by urology, discharged (Dr. Bernardo Heater)  . CAP (community acquired pneumonia) 02/15/2015  . Colon polyps   . Diverticulosis   . GERD (gastroesophageal reflux disease)   . Hairy cell leukemia ([place]) 2006   recurrent, seizure on rituxan, now on cladribine ([place])  . History of pneumonia 2000's   "once" (07/07/2012)  . History of shingles   . HLD (hyperlipidemia)   . Hypertension   . Pneumonia   . Shortness of breath dyspnea   . Systolic murmur 44/96/7591  . Type 2 diabetes, controlled, with retinopathy ([place])     Past Surgical History:  Procedure Laterality Date  . [place] VITRECTOMY WITH 20 GAUGE MVR PORT FOR MACULAR HOLE  07/07/2012   Procedure: 25 GAUGE PARS PLANA VITRECTOMY WITH 20 GAUGE MVR PORT FOR MACULAR HOLE;  Surgeon: Hayden Pedro, MD;  Location: [facility];  Service: Ophthalmology;  Laterality:  Left;  . BONE MARROW BIOPSY  2016  . CARDIOVASCULAR STRESS TEST  2013   treadmill - no evidence ischemia, EF 61%  . CATARACT EXTRACTION W/ INTRAOCULAR LENS  IMPLANT, BILATERAL  ~ 2010  . COLONOSCOPY  2014   [place] WNL no rpt needed, h/o polyps  . EYE SURGERY Left 06/2012   laser surgery  . GAS INSERTION  07/07/2012   Procedure: INSERTION OF GAS;  Surgeon: Hayden Pedro, MD;  Location: [facility];  Service: Ophthalmology;  Laterality: Left;  [id number]  . PERIPHERAL VASCULAR CATHETERIZATION N/A 02/23/2015   Procedure: Glori Luis Cath Insertion;  Surgeon: Algernon Huxley, MD;  Location: [facility];  Service: Cardiovascular;  Laterality: N/A;  . SERUM PATCH  07/07/2012   Procedure: SERUM PATCH;  Surgeon: Hayden Pedro, MD;  Location: [facility];  Service: Ophthalmology;  Laterality: Left;  . SKIN CANCER EXCISION     "all over my face" (07/07/2012)    Family History  Problem Relation Age of Onset  . Dementia Mother   . Heart failure Father 67  . Cancer Sister     breast  . Diabetes Paternal Uncle   . Diabetes Paternal Aunt   . CAD Brother 35    MI  . Stroke Neg Hx     Social History:  reports that he quit smoking about 47 years ago. His smoking use included Cigarettes. He  has a 50.00 pack-year smoking history. He has never used smokeless tobacco. He reports that he drinks alcohol. He reports that he does not use drugs.  He works at [employer] 20-32 hours/week.  The patient 's wife has had 3 hip operations.  She underwent hip replacement 03/06/2016.  She recently hurt her shoulder.  He takes care of his wife.  He states that they either eat out or bring food in.  His best contact number is his cell: [phone].  He is alone today.  Allergies:  Allergies  Allergen Reactions  . Rituximab Rash    Chest tightness  . Blood-Group Specific Substance Other (See Comments)    Had a post transfusion reaction of red blood cells; NOW REQUIRES WASHED BLOOD CELLS  . Primaxin [Imipenem] Other (See Comments)     Possible allergy  . Sulfa Antibiotics Itching and Rash  Possible allergy to Zoloft, allopurinol, and a chemotherapy medication.  He tolerates Dapsone.  Current Medications: Current Outpatient Prescriptions  Medication Sig Dispense Refill  . acyclovir (ZOVIRAX) 400 MG tablet TAKE TWO TABLETS TWICE DAILY 120 tablet 6  . aspirin EC 81 MG tablet Take 81 mg by mouth daily.    Marland Kitchen dexamethasone (DECADRON) 4 MG tablet Take 5 tablets (20 mg total) by mouth once a week. 20 tablet 1  . glucose blood (ACCU-CHEK AVIVA PLUS) test strip Use to check sugar twice daily Dx:E11.9 200 each 3  . HUMULIN R 100 UNIT/ML injection INJECT 10 UNITS SUBCUTANEOUSLY TWICE DAILY BEFORE MEALS 10 mL 6  . hydrochlorothiazide (HYDRODIURIL) 25 MG tablet TAKE ONE TABLET EVERY DAY 30 tablet 5  . insulin NPH Human (HUMULIN N) 100 UNIT/ML injection Inject 0.39 mLs (39 Units total) into the skin 2 (two) times daily before a meal.    . lenalidomide (REVLIMID) 5 MG capsule 5 mg a day for 21 days then 7 days off 21 capsule 0  . lidocaine-prilocaine (EMLA) cream Apply 1 application topically as needed. 30 g 3  . lisinopril (PRINIVIL,ZESTRIL) 20 MG tablet TAKE 1 TABLET BY MOUTH TWICE DAILY 60 tablet 6  . metFORMIN (GLUCOPHAGE) 1000 MG tablet TAKE ONE TABLET BY MOUTH TWICE DAILY AS DIRECTED 180 tablet 3  . MICROLET LANCETS MISC USE AS DIRECTED 200 each 3  . pravastatin (PRAVACHOL) 20 MG tablet TAKE ONE TABLET AT BEDTIME 30 tablet 6  . ranitidine (ZANTAC) 150 MG tablet TAKE 1 TABLET BY MOUTH TWICE DAILY AS NEEDED 180 tablet 3  . tamsulosin (FLOMAX) 0.4 MG CAPS capsule Take 2 capsules (0.8 mg total) by mouth daily. 60 capsule 1  . temazepam (RESTORIL) 7.5 MG capsule Take 7.5 mg by mouth once.     . traZODone (DESYREL) 50 MG tablet Take 0.5-1 tablets (25-50 mg total) by mouth at bedtime as needed for sleep. 30 tablet 3  . vitamin B-12 (CYANOCOBALAMIN) 1000 MCG tablet Take 1,000 mcg by mouth daily.    . vitamin E 400 UNIT capsule Take 200  Units by mouth daily.      No current facility-administered medications for this visit.    Facility-Administered Medications Ordered in Other Visits  Medication Dose Route Frequency Provider Last Rate Last Dose  . heparin lock flush 100 unit/mL  500 Units Intravenous Once Lequita Asal, MD      . sodium chloride 0.9 % injection 10 mL  10 mL Intravenous PRN Lequita Asal, MD   10 mL at 03/03/15 0903  . sodium chloride 0.9 % injection 10 mL  10 mL  Intracatheter PRN Lequita Asal, MD   10 mL at 03/10/15 1410   Review of Systems:  GENERAL:  Feels "fine".  No fevers or sweats.  Weight up 1 pound. PERFORMANCE STATUS (ECOG):  1 HEENT: No visual changes, runny nose, sore throat. Pulmonary:  Chronic shortness of breath on exertion.  No cough.  No hemoptysis. Cardiac:  No chest pain, palpitations, orthopnea, or PND. GI:  Appetite good.  No nausea, vomiting, diarrhea, constipation, melena or hematochezia.  GU:  No urgency, frequency, dysuria, or hematuria.  On Flomax. Musculoskeletal:  No back pain.  No joint pain.  No muscle tenderness. Extremities:  No pain or swelling. Skin:  No rashes or skin changes. Neuro:  No headache, numbness or weakness, balance or coordination issues. Endocrine:  Diabetes.  No thyroid issues, hot flashes or night sweats. Psych:  No mood changes, depression or anxiety.  Pain:  No focal pain. Review of systems:  All other systems reviewed and found to be negative.   Physical Exam: Blood pressure (!) 145/73, pulse 71, temperature 97.5 F (36.4 C), temperature source Tympanic, resp. rate 18, weight 218 lb 2 oz (98.9 kg).  GENERAL:  Well developed, well nourished, gentleman sitting comfortably in the exam room in no acute distress. MENTAL STATUS:  Alert and oriented to person, place and time. HEAD:  Pearline Cables white hair.  Normocephalic, atraumatic, face symmetric, no Cushingoid features. EYES:  Glasses. Blue eyes.  Pupils equal round and reactive to light and  accomodation.  No conjunctivitis or scleral icterus. ENT:  Oropharynx clear without lesion.  Tongue normal. Mucous membranes moist.  RESPIRATORY:  Clear to auscultation without rales, wheezes or rhonchi. CARDIOVASCULAR:  Regular rate and rhythm without murmur, rub or gallop. ABDOMEN:  Soft, non-tender, with active bowel sounds, and no hepatosplenomegaly.  No masses. SKIN:  No rashes or ulcers. EXTREMITIES: No edema, no skin discoloration or tenderness.  No palpable cords. LYMPH NODES: No palpable cervical, supraclavicular, axillary or inguinal adenopathy  NEUROLOGICAL: Unremarkable. PSYCH:  Appropriate.    Appointment on 11/12/2016  Component Date Value Ref Range Status  . WBC 11/12/2016 2.2* 3.8 - 10.6 K/uL Final  . RBC 11/12/2016 2.16* 4.40 - 5.90 MIL/uL Final  . Hemoglobin 11/12/2016 8.3* 13.0 - 18.0 g/dL Final  . HCT 11/12/2016 23.5* 40.0 - 52.0 % Final  . MCV 11/12/2016 109.0* 80.0 - 100.0 fL Final  . MCH 11/12/2016 38.4* 26.0 - 34.0 pg Final  . MCHC 11/12/2016 35.3  32.0 - 36.0 g/dL Final  . RDW 11/12/2016 17.0* 11.5 - 14.5 % Final  . Platelets 11/12/2016 63* 150 - 440 K/uL Final  . Neutrophils Relative % 11/12/2016 67  % Final  . Neutro Abs 11/12/2016 1.5  1.4 - 6.5 K/uL Final  . Lymphocytes Relative 11/12/2016 23  % Final  . Lymphs Abs 11/12/2016 0.5* 1.0 - 3.6 K/uL Final  . Monocytes Relative 11/12/2016 5  % Final  . Monocytes Absolute 11/12/2016 0.1* 0.2 - 1.0 K/uL Final  . Eosinophils Relative 11/12/2016 4  % Final  . Eosinophils Absolute 11/12/2016 0.1  0 - 0.7 K/uL Final  . Basophils Relative 11/12/2016 1  % Final  . Basophils Absolute 11/12/2016 0.0  0 - 0.1 K/uL Final  . Blood Bank Specimen 11/12/2016 SAMPLE AVAILABLE FOR TESTING   Final  . Sample Expiration 11/12/2016 11/15/2016   Final    Assessment:  Alex Smith is a 82 y.o. 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  He was diagnosed with hairy cell leukemia in  2006.  He received cladribine 11/06-11/05/2005.  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   Bone marrow aspirate and biopsy on 02/07/2015 revealed a extensive marrow involvement by recurrent hairy cell leukemia (approximately 80% of cells in the core). There was a monoclonal plasma cell infiltrate (approximately 10%), compatible with a plasma cell neoplasm. Marrow was hypercellular (40-50%) with markedly diminished residual trilineage hematopoiesis and mild multilineage dyspoiesis.  Peripheral smear revealed leukopenia (WBC 1900) with 2% atypical lymphoid cells compatible with hairy cell leukemia. There was moderate anemia with anisopoikilocytosis. 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  FISH studies for MDS were negative.  Cytogenetics were normal (46, XY).  He has an unclear allergy history.  He may be allergic to Primaxin, Zoloft, allopurinol, and a chemotherapy medication.  Reaction occurred in 06/2005.  He is allergic to sulfa.  He is not allergic to dapsone.  He requires washed RBCs.  He is on prophylactic acyclovir.  Dapsone and voriconazole were discontinued.  He has a history of shingles prior to prophylactic acyclovir.  He is 17 months status post cladribine (began 03/03/2015).  His counts plateaued on 07/25/2015 and have subsequently decreased.  Labs on 07/25/2015 revealed a normal B12 (894), folate (60.5), and TSH (2.562).  Ferritin was 50 on 04/26/2016.  Labs on 08/22/2016 revealed a normal B12, folate, and TSH.  He is on oral B12.  Bone marrow on 11/03/2015 revealed persistent plasma cell neoplasm, now with mildy atypical monoclonal plasma cells estimated at 20-30%.   There was no morphologic evidence of residual hairy cell leukemia.  There was a minute population (0.03%) with hairy cell immunophenotype detected by flow.  Marrow was normocellular for age with relative erythroid hyperplasia, relative myeloid hypoplasia, mild  dyspoiesis, adequate megakaryocytes and no increased blasts.  There was no significant increase in marrow reticulin fibers.  Iron was present.  FISH studies for MDS were negative.  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  Cytogenetics were normal (46, XY).  Bone marrow on 03/13/2016 was normocellular to marginally hypercellular for age (20-40%) with relative erythroid hyperplasia and mild dyserythropoiesis, decreased granulopoiesis and adequate megakaryocytes.  There was a persistent 15-20% plasma cell neoplasm.  There was no overt increase in reticulin fibrosis.  Storage iron was present.  Flow cytometry revealed abnormal/monocytic plasma cell population (3% sample), consistent with a plasma cell neoplastic process.  Cytogenetics were normal (46, XY).  FISH studies for myeloma revealed a CCND1/IGH translocation, t(11;14) in 53% of nuclei.  SPEP has been followed: 1.0 on 02/03/2015, 1.4 gm/dL on 07/25/2015, 1.3 gm/dL on 11/17/2015, 1.2 gm/dL on 06/28/2016 and 1.0 gm/dL on 09/20/2016.    Kappa free light chains have been followed: 47.63 (ratio 3.12) on 02/03/2015, 54.9 (ratio 3.14) on 02/07/2015, 46.23 (ratio 3.68) on 07/25/2015, and 50.5 (ratio 4.82) on 11/17/2015.  Beta2-microglobulin was 2.5 on 11/17/2015.  24 hour urine on 11/21/2015 revealed 234.8 mg protein in 24 hours (no monoclonal protein).  Bone survey on 11/24/2015 revealed no lytic lesions.  Epo level was 329.6 on 03/29/2016.  He began a trial of Procrit.  He received 10,000 units of Procrit weekly (04/26/2016 - 06/14/2016).  Hemoglobin ranged between 8.4 and 8.9 without trend.    He is s/p 1 cycle ofRevlimid (10 mg) and Decadron(06/29/2016 - 07/19/2016). Cycle #1 was complicated by anemia and neutropenia. He has received 3 units of PRBCs(1 unit:07/22/2016 and 2 units:  08/07/2016). He received 6 days ofGCSF (last 08/05/2016).  He is currently day 5 of cycle #3 Revlimid (75m) and  Decadron (09/14/2016 - 11/08/2016).  He began weekly GCSF on 11/05/2016.  He is on aspirin 81 mg a day prophylaxis.  PET scan on 03/14/2016 revealed no evidence of hypermetabolic lymphadenopathy or focal skeletal lesions to suggest marrow or cortical bone involvement.  Symptomatically, he denies any complaints.  Exam is stable.  Counts include a hematocrit 23.5, hemoglobin of 8.3, platelets 63,000 and ANC 1500.  Plan: 1.  Labs today:  CBC with diff, hold tube. 2.  Continue Revlimid 5 mg a day (3 weeks on and 1 week off). 3.  GCSF 480 mcg today. 4.  RTC in 1 week for MD (Janese Banks assessment, labs (CBC with diff, hold tube), and +/- Neupogen. 5.  RTC in 2 weeks for MD ([name], labs (CBC with diff, hold tube), and +/- Neupogen.   MLequita Asal MD  11/12/2016, 9:42 AM

## 2016-11-12 NOTE — Progress Notes (Signed)
Patient offers no complaints. 

## 2016-11-17 DIAGNOSIS — T451X5A Adverse effect of antineoplastic and immunosuppressive drugs, initial encounter: Secondary | ICD-10-CM

## 2016-11-17 DIAGNOSIS — D61810 Antineoplastic chemotherapy induced pancytopenia: Secondary | ICD-10-CM | POA: Insufficient documentation

## 2016-11-18 ENCOUNTER — Other Ambulatory Visit: Payer: Self-pay | Admitting: Hematology and Oncology

## 2016-11-19 ENCOUNTER — Inpatient Hospital Stay: Payer: Medicare Other

## 2016-11-19 ENCOUNTER — Inpatient Hospital Stay (HOSPITAL_BASED_OUTPATIENT_CLINIC_OR_DEPARTMENT_OTHER): Payer: Medicare Other | Admitting: Oncology

## 2016-11-19 ENCOUNTER — Inpatient Hospital Stay: Payer: Medicare Other | Attending: Oncology

## 2016-11-19 VITALS — BP 156/69 | HR 93 | Temp 99.6°F | Resp 18 | Wt 214.2 lb

## 2016-11-19 DIAGNOSIS — E538 Deficiency of other specified B group vitamins: Secondary | ICD-10-CM

## 2016-11-19 DIAGNOSIS — Z794 Long term (current) use of insulin: Secondary | ICD-10-CM | POA: Insufficient documentation

## 2016-11-19 DIAGNOSIS — E119 Type 2 diabetes mellitus without complications: Secondary | ICD-10-CM | POA: Diagnosis not present

## 2016-11-19 DIAGNOSIS — D46Z Other myelodysplastic syndromes: Secondary | ICD-10-CM

## 2016-11-19 DIAGNOSIS — K5900 Constipation, unspecified: Secondary | ICD-10-CM | POA: Diagnosis not present

## 2016-11-19 DIAGNOSIS — Z7982 Long term (current) use of aspirin: Secondary | ICD-10-CM | POA: Insufficient documentation

## 2016-11-19 DIAGNOSIS — F17210 Nicotine dependence, cigarettes, uncomplicated: Secondary | ICD-10-CM | POA: Insufficient documentation

## 2016-11-19 DIAGNOSIS — Z85828 Personal history of other malignant neoplasm of skin: Secondary | ICD-10-CM | POA: Insufficient documentation

## 2016-11-19 DIAGNOSIS — N400 Enlarged prostate without lower urinary tract symptoms: Secondary | ICD-10-CM

## 2016-11-19 DIAGNOSIS — R5383 Other fatigue: Secondary | ICD-10-CM | POA: Insufficient documentation

## 2016-11-19 DIAGNOSIS — I10 Essential (primary) hypertension: Secondary | ICD-10-CM | POA: Diagnosis not present

## 2016-11-19 DIAGNOSIS — Z7689 Persons encountering health services in other specified circumstances: Secondary | ICD-10-CM | POA: Insufficient documentation

## 2016-11-19 DIAGNOSIS — Z8619 Personal history of other infectious and parasitic diseases: Secondary | ICD-10-CM | POA: Insufficient documentation

## 2016-11-19 DIAGNOSIS — Z856 Personal history of leukemia: Secondary | ICD-10-CM | POA: Diagnosis not present

## 2016-11-19 DIAGNOSIS — D649 Anemia, unspecified: Secondary | ICD-10-CM | POA: Diagnosis not present

## 2016-11-19 DIAGNOSIS — K219 Gastro-esophageal reflux disease without esophagitis: Secondary | ICD-10-CM | POA: Insufficient documentation

## 2016-11-19 DIAGNOSIS — D708 Other neutropenia: Secondary | ICD-10-CM

## 2016-11-19 DIAGNOSIS — Z86010 Personal history of colonic polyps: Secondary | ICD-10-CM | POA: Insufficient documentation

## 2016-11-19 DIAGNOSIS — E785 Hyperlipidemia, unspecified: Secondary | ICD-10-CM | POA: Insufficient documentation

## 2016-11-19 DIAGNOSIS — C9000 Multiple myeloma not having achieved remission: Secondary | ICD-10-CM

## 2016-11-19 DIAGNOSIS — C9141 Hairy cell leukemia, in remission: Secondary | ICD-10-CM

## 2016-11-19 DIAGNOSIS — Z8701 Personal history of pneumonia (recurrent): Secondary | ICD-10-CM | POA: Diagnosis not present

## 2016-11-19 LAB — CBC WITH DIFFERENTIAL/PLATELET
Basophils Absolute: 0 10*3/uL (ref 0–0.1)
Basophils Relative: 1 %
Eosinophils Absolute: 0.1 10*3/uL (ref 0–0.7)
Eosinophils Relative: 3 %
HCT: 24 % — ABNORMAL LOW (ref 40.0–52.0)
Hemoglobin: 8.4 g/dL — ABNORMAL LOW (ref 13.0–18.0)
Lymphocytes Relative: 23 %
Lymphs Abs: 0.7 10*3/uL — ABNORMAL LOW (ref 1.0–3.6)
MCH: 38.5 pg — ABNORMAL HIGH (ref 26.0–34.0)
MCHC: 35.1 g/dL (ref 32.0–36.0)
MCV: 109.6 fL — ABNORMAL HIGH (ref 80.0–100.0)
Monocytes Absolute: 0.3 10*3/uL (ref 0.2–1.0)
Monocytes Relative: 10 %
Neutro Abs: 1.9 10*3/uL (ref 1.4–6.5)
Neutrophils Relative %: 63 %
Platelets: 65 10*3/uL — ABNORMAL LOW (ref 150–440)
RBC: 2.19 MIL/uL — ABNORMAL LOW (ref 4.40–5.90)
RDW: 16.9 % — ABNORMAL HIGH (ref 11.5–14.5)
WBC: 3 10*3/uL — ABNORMAL LOW (ref 3.8–10.6)

## 2016-11-19 LAB — SAMPLE TO BLOOD BANK

## 2016-11-19 MED ORDER — FILGRASTIM 480 MCG/0.8ML IJ SOSY: 480.0000 ug | PREFILLED_SYRINGE | Freq: Once | INTRAMUSCULAR | Status: DC

## 2016-11-19 MED ORDER — TBO-FILGRASTIM 480 MCG/0.8ML ~~LOC~~ SOSY
480.0000 ug | PREFILLED_SYRINGE | Freq: Once | SUBCUTANEOUS | Status: AC
  Administered 2016-11-19: 480 ug via SUBCUTANEOUS
  Filled 2016-11-19: qty 0.8

## 2016-11-19 NOTE — Progress Notes (Signed)
[facility] day:  11/19/2016   Chief Complaint: Alex Smith is a 82 y.o. male with a myelodysplastic marrow, smoldering myeloma, and a history of hairy cell leukemia who is seen for 1 assessment on day 5 of cycle #3 Revlimid.  HPI:  The patient was last seen in the medical oncology clinic on 11/12/2016.  At that time, he received a second weekly dose of GCSF, first initiated on 11/05/2016 with Revlimid to allow continuation of therapy. He began Revlimid 5 mg a day on 11/08/2016.  He received G-CSF 480 g on 11/05/2016 and 11/12/2016.  CBC today revealed a hematocrit 24.0, hemoglobin 8.4, MCV 109.6, platelets 65,000, white count 3000 with an ANC of 1900.  Symptomatically, he is doing well.   He reports reduced appetite.  His weight is down 4 pounds.  He denies any bruising or bleeding.  He denies any fevers or infections.  Energy level is stable.   Past Medical History:  Diagnosis Date  . Anemia   . B12 deficiency   . Basal cell carcinoma of face   . BPH (benign prostatic hypertrophy)    followed by urology, discharged (Dr. Bernardo Heater)  . CAP (community acquired pneumonia) 02/15/2015  . Colon polyps   . Diverticulosis   . GERD (gastroesophageal reflux disease)   . Hairy cell leukemia ([place]) 2006   recurrent, seizure on rituxan, now on cladribine ([place])  . History of pneumonia 2000's   "once" (07/07/2012)  . History of shingles   . HLD (hyperlipidemia)   . Hypertension   . Pneumonia   . Shortness of breath dyspnea   . Systolic murmur 48/88/9169  . Type 2 diabetes, controlled, with retinopathy ([place])     Past Surgical History:  Procedure Laterality Date  . [place] VITRECTOMY WITH 20 GAUGE MVR PORT FOR MACULAR HOLE  07/07/2012   Procedure: 25 GAUGE PARS PLANA VITRECTOMY WITH 20 GAUGE MVR PORT FOR MACULAR HOLE;  Surgeon: Hayden Pedro, MD;  Location: [facility];  Service: Ophthalmology;  Laterality: Left;  . BONE MARROW BIOPSY   2016  . CARDIOVASCULAR STRESS TEST  2013   treadmill - no evidence ischemia, EF 61%  . CATARACT EXTRACTION W/ INTRAOCULAR LENS  IMPLANT, BILATERAL  ~ 2010  . COLONOSCOPY  2014   [place] WNL no rpt needed, h/o polyps  . EYE SURGERY Left 06/2012   laser surgery  . GAS INSERTION  07/07/2012   Procedure: INSERTION OF GAS;  Surgeon: Hayden Pedro, MD;  Location: [facility];  Service: Ophthalmology;  Laterality: Left;  [id number]  . PERIPHERAL VASCULAR CATHETERIZATION N/A 02/23/2015   Procedure: Glori Luis Cath Insertion;  Surgeon: Algernon Huxley, MD;  Location: [facility];  Service: Cardiovascular;  Laterality: N/A;  . SERUM PATCH  07/07/2012   Procedure: SERUM PATCH;  Surgeon: Hayden Pedro, MD;  Location: [facility];  Service: Ophthalmology;  Laterality: Left;  . SKIN CANCER EXCISION     "all over my face" (07/07/2012)    Family History  Problem Relation Age of Onset  . Dementia Mother   . Heart failure Father 37  . Cancer Sister     breast  . Diabetes Paternal Uncle   . Diabetes Paternal Aunt   . CAD Brother 27    MI  . Stroke Neg Hx     Social History:  reports that he quit smoking about 47 years ago. His smoking use included Cigarettes. He has a  50.00 pack-year smoking history. He has never used smokeless tobacco. He reports that he drinks alcohol. He reports that he does not use drugs.  He works at [employer] 20-32 hours/week.  The patient 's wife has had 3 hip operations.  She underwent hip replacement 03/06/2016.  She recently hurt her shoulder.  He takes care of his wife.  He states that they either eat out or bring food in.  His best contact number is his cell: [phone].  He is alone today.  Allergies:  Allergies  Allergen Reactions  . Rituximab Rash    Chest tightness  . Blood-Group Specific Substance Other (See Comments)    Had a post transfusion reaction of red blood cells; NOW REQUIRES WASHED BLOOD CELLS  . Primaxin [Imipenem] Other (See Comments)    Possible allergy  .  Sulfa Antibiotics Itching and Rash  Possible allergy to Zoloft, allopurinol, and a chemotherapy medication.  He tolerates Dapsone.  Current Medications: Current Outpatient Prescriptions  Medication Sig Dispense Refill  . acyclovir (ZOVIRAX) 400 MG tablet TAKE TWO TABLETS TWICE DAILY 120 tablet 6  . aspirin EC 81 MG tablet Take 81 mg by mouth daily.    Marland Kitchen dexamethasone (DECADRON) 4 MG tablet Take 5 tablets (20 mg total) by mouth once a week. 20 tablet 1  . glucose blood (ACCU-CHEK AVIVA PLUS) test strip Use to check sugar twice daily Dx:E11.9 200 each 3  . HUMULIN R 100 UNIT/ML injection INJECT 10 UNITS SUBCUTANEOUSLY TWICE DAILY BEFORE MEALS 10 mL 6  . hydrochlorothiazide (HYDRODIURIL) 25 MG tablet TAKE ONE TABLET EVERY DAY 30 tablet 5  . insulin NPH Human (HUMULIN N) 100 UNIT/ML injection Inject 0.39 mLs (39 Units total) into the skin 2 (two) times daily before a meal.    . lenalidomide (REVLIMID) 5 MG capsule 5 mg a day for 21 days then 7 days off 21 capsule 0  . lidocaine-prilocaine (EMLA) cream Apply 1 application topically as needed. 30 g 3  . lisinopril (PRINIVIL,ZESTRIL) 20 MG tablet TAKE 1 TABLET BY MOUTH TWICE DAILY 60 tablet 6  . metFORMIN (GLUCOPHAGE) 1000 MG tablet TAKE ONE TABLET BY MOUTH TWICE DAILY AS DIRECTED 180 tablet 3  . MICROLET LANCETS MISC USE AS DIRECTED 200 each 3  . pravastatin (PRAVACHOL) 20 MG tablet TAKE ONE TABLET AT BEDTIME 30 tablet 6  . ranitidine (ZANTAC) 150 MG tablet TAKE 1 TABLET BY MOUTH TWICE DAILY AS NEEDED 180 tablet 3  . tamsulosin (FLOMAX) 0.4 MG CAPS capsule Take 2 capsules (0.8 mg total) by mouth daily. 60 capsule 1  . temazepam (RESTORIL) 7.5 MG capsule Take 7.5 mg by mouth once.     . traZODone (DESYREL) 50 MG tablet Take 0.5-1 tablets (25-50 mg total) by mouth at bedtime as needed for sleep. 30 tablet 3  . vitamin B-12 (CYANOCOBALAMIN) 1000 MCG tablet Take 1,000 mcg by mouth daily.    . vitamin E 400 UNIT capsule Take 200 Units by mouth daily.       No current facility-administered medications for this visit.    Facility-Administered Medications Ordered in Other Visits  Medication Dose Route Frequency Provider Last Rate Last Dose  . heparin lock flush 100 unit/mL  500 Units Intravenous Once Lequita Asal, MD      . sodium chloride 0.9 % injection 10 mL  10 mL Intravenous PRN Lequita Asal, MD   10 mL at 03/03/15 0903  . sodium chloride 0.9 % injection 10 mL  10 mL Intracatheter PRN  Lequita Asal, MD   10 mL at 03/10/15 1410   Review of Systems:  GENERAL:  Feels "fine".  No fevers or sweats.  Weight down 4 pounds. PERFORMANCE STATUS (ECOG):  1 HEENT: No visual changes, runny nose, sore throat. Pulmonary:  Chronic shortness of breath on exertion.  No cough.  No hemoptysis. Cardiac:  No chest pain, palpitations, orthopnea, or PND. GI:  Appetite reduced.  Constipation.  No nausea, vomiting, diarrhea, melena or hematochezia.  GU:  Frequency.  No urgency, dysuria, or hematuria.  On Flomax. Musculoskeletal:  No back pain.  No joint pain.  No muscle tenderness. Extremities:  No pain or swelling. Skin:  No rashes or skin changes. Neuro:  No headache, numbness or weakness, balance or coordination issues. Endocrine:  Diabetes.  No thyroid issues, hot flashes or night sweats. Psych:  No mood changes, depression or anxiety.  Pain:  No focal pain. Review of systems:  All other systems reviewed and found to be negative.   Physical Exam: Blood pressure (!) 156/69, pulse 93, temperature 99.6 F (37.6 C), temperature source Tympanic, resp. rate 18, weight 214 lb 3 oz (97.2 kg).  GENERAL:  Well developed, well nourished, gentleman sitting comfortably in the exam room in no acute distress. MENTAL STATUS:  Alert and oriented to person, place and time. HEAD:  Pearline Cables white hair.  Normocephalic, atraumatic, face symmetric, no Cushingoid features. EYES:  Glasses. Blue eyes.  Pupils equal round and reactive to light and accomodation.  No  conjunctivitis or scleral icterus. ENT:  Oropharynx clear without lesion.  Tongue normal. Mucous membranes moist.  RESPIRATORY:  Clear to auscultation without rales, wheezes or rhonchi. CARDIOVASCULAR:  Regular rate and rhythm without murmur, rub or gallop. ABDOMEN:  Soft, non-tender, with active bowel sounds, and no hepatosplenomegaly.  No masses. SKIN:  No rashes or ulcers. EXTREMITIES: No edema, no skin discoloration or tenderness.  No palpable cords. LYMPH NODES: No palpable cervical, supraclavicular, axillary or inguinal adenopathy  NEUROLOGICAL: Unremarkable. PSYCH:  Appropriate.    Appointment on 11/19/2016  Component Date Value Ref Range Status  . WBC 11/19/2016 3.0* 3.8 - 10.6 K/uL Final  . RBC 11/19/2016 2.19* 4.40 - 5.90 MIL/uL Final  . Hemoglobin 11/19/2016 8.4* 13.0 - 18.0 g/dL Final  . HCT 11/19/2016 24.0* 40.0 - 52.0 % Final  . MCV 11/19/2016 109.6* 80.0 - 100.0 fL Final  . MCH 11/19/2016 38.5* 26.0 - 34.0 pg Final  . MCHC 11/19/2016 35.1  32.0 - 36.0 g/dL Final  . RDW 11/19/2016 16.9* 11.5 - 14.5 % Final  . Platelets 11/19/2016 65* 150 - 440 K/uL Final  . Neutrophils Relative % 11/19/2016 63  % Final  . Neutro Abs 11/19/2016 1.9  1.4 - 6.5 K/uL Final  . Lymphocytes Relative 11/19/2016 23  % Final  . Lymphs Abs 11/19/2016 0.7* 1.0 - 3.6 K/uL Final  . Monocytes Relative 11/19/2016 10  % Final  . Monocytes Absolute 11/19/2016 0.3  0.2 - 1.0 K/uL Final  . Eosinophils Relative 11/19/2016 3  % Final  . Eosinophils Absolute 11/19/2016 0.1  0 - 0.7 K/uL Final  . Basophils Relative 11/19/2016 1  % Final  . Basophils Absolute 11/19/2016 0.0  0 - 0.1 K/uL Final    Assessment:  SHAUN RUNYON is a 82 y.o. 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  He was diagnosed with hairy cell leukemia in 2006.  He received cladribine 11/06-11/05/2005.  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  Bone marrow aspirate and biopsy on 02/07/2015 revealed a  extensive marrow involvement by recurrent hairy cell leukemia (approximately 80% of cells in the core). There was a monoclonal plasma cell infiltrate (approximately 10%), compatible with a plasma cell neoplasm. Marrow was hypercellular (40-50%) with markedly diminished residual trilineage hematopoiesis and mild multilineage dyspoiesis.  Peripheral smear revealed leukopenia (WBC 1900) with 2% atypical lymphoid cells compatible with hairy cell leukemia. There was moderate anemia with anisopoikilocytosis. 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  FISH studies for MDS were negative.  Cytogenetics were normal (46, XY).  He has an unclear allergy history.  He may be allergic to Primaxin, Zoloft, allopurinol, and a chemotherapy medication.  Reaction occurred in 06/2005.  He is allergic to sulfa.  He is not allergic to dapsone.  He requires washed RBCs.  He is on prophylactic acyclovir.  Dapsone and voriconazole were discontinued.  He has a history of shingles prior to prophylactic acyclovir.  He is 17 months status post cladribine (began 03/03/2015).  His counts plateaued on 07/25/2015 and have subsequently decreased.  Labs on 07/25/2015 revealed a normal B12 (894), folate (60.5), and TSH (2.562).  Ferritin was 50 on 04/26/2016.  Labs on 08/22/2016 revealed a normal B12, folate, and TSH.  He is on oral B12.  Bone marrow on 11/03/2015 revealed persistent plasma cell neoplasm, now with mildy atypical monoclonal plasma cells estimated at 20-30%.   There was no morphologic evidence of residual hairy cell leukemia.  There was a minute population (0.03%) with hairy cell immunophenotype detected by flow.  Marrow was normocellular for age with relative erythroid hyperplasia, relative myeloid hypoplasia, mild dyspoiesis, adequate megakaryocytes and no increased blasts.  There was no significant increase in marrow reticulin fibers.  Iron was present.  FISH  studies for MDS were negative.  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  Cytogenetics were normal (46, XY).  Bone marrow on 03/13/2016 was normocellular to marginally hypercellular for age (20-40%) with relative erythroid hyperplasia and mild dyserythropoiesis, decreased granulopoiesis and adequate megakaryocytes.  There was a persistent 15-20% plasma cell neoplasm.  There was no overt increase in reticulin fibrosis.  Storage iron was present.  Flow cytometry revealed abnormal/monocytic plasma cell population (3% sample), consistent with a plasma cell neoplastic process.  Cytogenetics were normal (46, XY).  FISH studies for myeloma revealed a CCND1/IGH translocation, t(11;14) in 53% of nuclei.  SPEP has been followed: 1.0 on 02/03/2015, 1.4 gm/dL on 07/25/2015, 1.3 gm/dL on 11/17/2015, 1.2 gm/dL on 06/28/2016 and 1.0 gm/dL on 09/20/2016.    Kappa free light chains have been followed: 47.63 (ratio 3.12) on 02/03/2015, 54.9 (ratio 3.14) on 02/07/2015, 46.23 (ratio 3.68) on 07/25/2015, and 50.5 (ratio 4.82) on 11/17/2015.  Beta2-microglobulin was 2.5 on 11/17/2015.  24 hour urine on 11/21/2015 revealed 234.8 mg protein in 24 hours (no monoclonal protein).  Bone survey on 11/24/2015 revealed no lytic lesions.  Epo level was 329.6 on 03/29/2016.  He began a trial of Procrit.  He received 10,000 units of Procrit weekly (04/26/2016 - 06/14/2016).  Hemoglobin ranged between 8.4 and 8.9 without trend.    He is s/p 1 cycle ofRevlimid (10 mg) and Decadron(06/29/2016 - 07/19/2016). Cycle #1 was complicated by anemia and neutropenia. He has received 3 units of PRBCs(1 unit:07/22/2016 and 2 units: 08/07/2016). He received 6 days ofGCSF (last 08/05/2016).  He is currently day 12 of cycle #3 Revlimid (83m) and Decadron (09/14/2016 -  11/19/2016).  He began weekly GCSF on 11/05/2016.  He is on aspirin 81 mg a day prophylaxis.  PET scan on 03/14/2016 revealed  no evidence of hypermetabolic lymphadenopathy or focal skeletal lesions to suggest marrow or cortical bone involvement.  Symptomatically, he denies any complaints.  Exam is stable.  Counts include a hematocrit 24.0, hemoglobin of 8.4, platelets 65,000 and ANC 1900.  Plan: 1.  Labs today:  CBC with diff, hold tube. 2.  Continue Revlimid 5 mg a day (3 weeks on and 1 week off). 3.  GCSF 480 mcg today. 4.  RTC in 1 week for MD [name]), labs (CBC with diff, hold tube), and +/- Neupogen.   Lucendia Herrlich, NP  11/19/2016, 12:17 PM

## 2016-11-19 NOTE — Progress Notes (Signed)
Patient states appetite is a little off.  Otherwise, no complaints.

## 2016-11-19 NOTE — Progress Notes (Signed)
I have personally performed a a face to face evaluation of this patient and I agree with the history and physical and assessment as documented by NP Lew Dawes. WBC count improved. countinue weekly neupogen. He is tolerating revlimid well without significant side effects.  Dr. Randa Evens, MD, MPH [place] at [facility] Pager- [phone] 11/19/2016 4:51 PM

## 2016-11-26 ENCOUNTER — Encounter: Payer: Self-pay | Admitting: Hematology and Oncology

## 2016-11-26 ENCOUNTER — Inpatient Hospital Stay (HOSPITAL_BASED_OUTPATIENT_CLINIC_OR_DEPARTMENT_OTHER): Payer: Medicare Other | Admitting: Hematology and Oncology

## 2016-11-26 ENCOUNTER — Inpatient Hospital Stay: Payer: Medicare Other

## 2016-11-26 VITALS — BP 128/71 | HR 76 | Temp 98.4°F | Resp 18 | Wt 214.6 lb

## 2016-11-26 DIAGNOSIS — D649 Anemia, unspecified: Secondary | ICD-10-CM

## 2016-11-26 DIAGNOSIS — Z7689 Persons encountering health services in other specified circumstances: Secondary | ICD-10-CM

## 2016-11-26 DIAGNOSIS — R5383 Other fatigue: Secondary | ICD-10-CM

## 2016-11-26 DIAGNOSIS — K219 Gastro-esophageal reflux disease without esophagitis: Secondary | ICD-10-CM | POA: Diagnosis not present

## 2016-11-26 DIAGNOSIS — Z7982 Long term (current) use of aspirin: Secondary | ICD-10-CM | POA: Diagnosis not present

## 2016-11-26 DIAGNOSIS — N400 Enlarged prostate without lower urinary tract symptoms: Secondary | ICD-10-CM | POA: Diagnosis not present

## 2016-11-26 DIAGNOSIS — E538 Deficiency of other specified B group vitamins: Secondary | ICD-10-CM

## 2016-11-26 DIAGNOSIS — Z86010 Personal history of colonic polyps: Secondary | ICD-10-CM

## 2016-11-26 DIAGNOSIS — Z8619 Personal history of other infectious and parasitic diseases: Secondary | ICD-10-CM

## 2016-11-26 DIAGNOSIS — E785 Hyperlipidemia, unspecified: Secondary | ICD-10-CM

## 2016-11-26 DIAGNOSIS — E119 Type 2 diabetes mellitus without complications: Secondary | ICD-10-CM | POA: Diagnosis not present

## 2016-11-26 DIAGNOSIS — I10 Essential (primary) hypertension: Secondary | ICD-10-CM

## 2016-11-26 DIAGNOSIS — C9000 Multiple myeloma not having achieved remission: Secondary | ICD-10-CM

## 2016-11-26 DIAGNOSIS — D46Z Other myelodysplastic syndromes: Secondary | ICD-10-CM | POA: Diagnosis not present

## 2016-11-26 DIAGNOSIS — F17210 Nicotine dependence, cigarettes, uncomplicated: Secondary | ICD-10-CM

## 2016-11-26 DIAGNOSIS — Z856 Personal history of leukemia: Secondary | ICD-10-CM | POA: Diagnosis not present

## 2016-11-26 DIAGNOSIS — C9141 Hairy cell leukemia, in remission: Secondary | ICD-10-CM

## 2016-11-26 DIAGNOSIS — Z8701 Personal history of pneumonia (recurrent): Secondary | ICD-10-CM

## 2016-11-26 DIAGNOSIS — K5900 Constipation, unspecified: Secondary | ICD-10-CM

## 2016-11-26 DIAGNOSIS — Z794 Long term (current) use of insulin: Secondary | ICD-10-CM | POA: Diagnosis not present

## 2016-11-26 DIAGNOSIS — Z85828 Personal history of other malignant neoplasm of skin: Secondary | ICD-10-CM | POA: Diagnosis not present

## 2016-11-26 LAB — CBC WITH DIFFERENTIAL/PLATELET
Basophils Absolute: 0 10*3/uL (ref 0–0.1)
Basophils Relative: 1 %
Eosinophils Absolute: 0.2 10*3/uL (ref 0–0.7)
Eosinophils Relative: 7 %
HCT: 24.1 % — ABNORMAL LOW (ref 40.0–52.0)
Hemoglobin: 8.3 g/dL — ABNORMAL LOW (ref 13.0–18.0)
Lymphocytes Relative: 22 %
Lymphs Abs: 0.5 10*3/uL — ABNORMAL LOW (ref 1.0–3.6)
MCH: 37.8 pg — ABNORMAL HIGH (ref 26.0–34.0)
MCHC: 34.5 g/dL (ref 32.0–36.0)
MCV: 109.7 fL — ABNORMAL HIGH (ref 80.0–100.0)
Monocytes Absolute: 0.1 10*3/uL — ABNORMAL LOW (ref 0.2–1.0)
Monocytes Relative: 5 %
Neutro Abs: 1.5 10*3/uL (ref 1.4–6.5)
Neutrophils Relative %: 65 %
Platelets: 85 10*3/uL — ABNORMAL LOW (ref 150–440)
RBC: 2.2 MIL/uL — ABNORMAL LOW (ref 4.40–5.90)
RDW: 16.2 % — ABNORMAL HIGH (ref 11.5–14.5)
WBC: 2.3 10*3/uL — ABNORMAL LOW (ref 3.8–10.6)

## 2016-11-26 LAB — SAMPLE TO BLOOD BANK

## 2016-11-26 MED ORDER — LENALIDOMIDE 5 MG PO CAPS: ORAL_CAPSULE | ORAL | 0 refills | Status: DC

## 2016-11-26 MED ORDER — TBO-FILGRASTIM 480 MCG/0.8ML ~~LOC~~ SOSY
480.0000 ug | PREFILLED_SYRINGE | Freq: Once | SUBCUTANEOUS | Status: AC
  Administered 2016-11-26: 480 ug via SUBCUTANEOUS
  Filled 2016-11-26: qty 0.8

## 2016-11-26 MED ORDER — FILGRASTIM 480 MCG/0.8ML IJ SOSY: 480.0000 ug | PREFILLED_SYRINGE | Freq: Once | INTRAMUSCULAR | Status: DC

## 2016-11-26 NOTE — Progress Notes (Signed)
[facility] day:  11/26/2016   Chief Complaint: Alex Smith is a 82 y.o. male with a myelodysplastic marrow, smoldering myeloma, and a history of hairy cell leukemia who is seen for assessment on day 19 of cycle #3 Revlimid.  HPI:  The patient was last seen by me in the medical oncology clinic on 11/12/2016.  At that time, he denied any issues or symptoms.  Energy level was stable. He continued Revlimid with weekly GCSF support.  Counts included a hematocrit of 23.5, hemoglobin 8.3, platelets 63,000, WBC 2200 with an ANC of 1500.  He saw Dr. Janese Banks in my absence on 11/19/2016.  At that time, appetite was down.  He had lost 4 pounds.  Counts included a hematocrit of 24, hemoglobin 8.4, MCV 109.6, platelets 65,000, and WBC 3000 with an ANC of 1900.  During the interim, he has felt "a little weak".  He denies any fevers, sweats or infections.   He denies any bruising or bleeding.  He continues to work. He has had some constipation.  He has one more Revlimid pill left this cycle.   Past Medical History:  Diagnosis Date  . Anemia   . B12 deficiency   . Basal cell carcinoma of face   . BPH (benign prostatic hypertrophy)    followed by urology, discharged (Dr. Bernardo Heater)  . CAP (community acquired pneumonia) 02/15/2015  . Colon polyps   . Diverticulosis   . GERD (gastroesophageal reflux disease)   . Hairy cell leukemia ([place]) 2006   recurrent, seizure on rituxan, now on cladribine ([place])  . History of pneumonia 2000's   "once" (07/07/2012)  . History of shingles   . HLD (hyperlipidemia)   . Hypertension   . Pneumonia   . Shortness of breath dyspnea   . Systolic murmur 16/02/3709  . Type 2 diabetes, controlled, with retinopathy ([place])     Past Surgical History:  Procedure Laterality Date  . [place] VITRECTOMY WITH 20 GAUGE MVR PORT FOR MACULAR HOLE  07/07/2012   Procedure: 25 GAUGE PARS PLANA VITRECTOMY WITH 20 GAUGE MVR PORT FOR  MACULAR HOLE;  Surgeon: Hayden Pedro, MD;  Location: [facility];  Service: Ophthalmology;  Laterality: Left;  . BONE MARROW BIOPSY  2016  . CARDIOVASCULAR STRESS TEST  2013   treadmill - no evidence ischemia, EF 61%  . CATARACT EXTRACTION W/ INTRAOCULAR LENS  IMPLANT, BILATERAL  ~ 2010  . COLONOSCOPY  2014   [place] WNL no rpt needed, h/o polyps  . EYE SURGERY Left 06/2012   laser surgery  . GAS INSERTION  07/07/2012   Procedure: INSERTION OF GAS;  Surgeon: Hayden Pedro, MD;  Location: [facility];  Service: Ophthalmology;  Laterality: Left;  [id number]  . PERIPHERAL VASCULAR CATHETERIZATION N/A 02/23/2015   Procedure: Glori Luis Cath Insertion;  Surgeon: Algernon Huxley, MD;  Location: [facility];  Service: Cardiovascular;  Laterality: N/A;  . SERUM PATCH  07/07/2012   Procedure: SERUM PATCH;  Surgeon: Hayden Pedro, MD;  Location: [facility];  Service: Ophthalmology;  Laterality: Left;  . SKIN CANCER EXCISION     "all over my face" (07/07/2012)    Family History  Problem Relation Age of Onset  . Dementia Mother   . Heart failure Father 27  . Cancer Sister     breast  . Diabetes Paternal Uncle   . Diabetes Paternal Aunt   . CAD Brother 66  MI  . Stroke Neg Hx     Social History:  reports that he quit smoking about 47 years ago. His smoking use included Cigarettes. He has a 50.00 pack-year smoking history. He has never used smokeless tobacco. He reports that he drinks alcohol. He reports that he does not use drugs.  He works at [employer] 20-32 hours/week.  The patient 's wife has had 3 hip operations.  She underwent hip replacement 03/06/2016.  She recently hurt her shoulder.  He takes care of his wife.  He states that they either eat out or bring food in.  His best contact number is his cell: [phone].  He is alone today.  Allergies:  Allergies  Allergen Reactions  . Rituximab Rash    Chest tightness  . Blood-Group Specific Substance Other (See Comments)    Had a post transfusion  reaction of red blood cells; NOW REQUIRES WASHED BLOOD CELLS  . Primaxin [Imipenem] Other (See Comments)    Possible allergy  . Sulfa Antibiotics Itching and Rash  Possible allergy to Zoloft, allopurinol, and a chemotherapy medication.  He tolerates Dapsone.  Current Medications: Current Outpatient Prescriptions  Medication Sig Dispense Refill  . acyclovir (ZOVIRAX) 400 MG tablet TAKE TWO TABLETS TWICE DAILY 120 tablet 6  . aspirin EC 81 MG tablet Take 81 mg by mouth daily.    Marland Kitchen dexamethasone (DECADRON) 4 MG tablet Take 5 tablets (20 mg total) by mouth once a week. 20 tablet 1  . glucose blood (ACCU-CHEK AVIVA PLUS) test strip Use to check sugar twice daily Dx:E11.9 200 each 3  . HUMULIN R 100 UNIT/ML injection INJECT 10 UNITS SUBCUTANEOUSLY TWICE DAILY BEFORE MEALS 10 mL 6  . hydrochlorothiazide (HYDRODIURIL) 25 MG tablet TAKE ONE TABLET EVERY DAY 30 tablet 5  . insulin NPH Human (HUMULIN N) 100 UNIT/ML injection Inject 0.39 mLs (39 Units total) into the skin 2 (two) times daily before a meal.    . lenalidomide (REVLIMID) 5 MG capsule 5 mg a day for 21 days then 7 days off 21 capsule 0  . lidocaine-prilocaine (EMLA) cream Apply 1 application topically as needed. 30 g 3  . lisinopril (PRINIVIL,ZESTRIL) 20 MG tablet TAKE 1 TABLET BY MOUTH TWICE DAILY 60 tablet 6  . metFORMIN (GLUCOPHAGE) 1000 MG tablet TAKE ONE TABLET BY MOUTH TWICE DAILY AS DIRECTED 180 tablet 3  . MICROLET LANCETS MISC USE AS DIRECTED 200 each 3  . pravastatin (PRAVACHOL) 20 MG tablet TAKE ONE TABLET AT BEDTIME 30 tablet 6  . ranitidine (ZANTAC) 150 MG tablet TAKE 1 TABLET BY MOUTH TWICE DAILY AS NEEDED 180 tablet 3  . tamsulosin (FLOMAX) 0.4 MG CAPS capsule Take 2 capsules (0.8 mg total) by mouth daily. 60 capsule 1  . temazepam (RESTORIL) 7.5 MG capsule Take 7.5 mg by mouth once.     . traZODone (DESYREL) 50 MG tablet Take 0.5-1 tablets (25-50 mg total) by mouth at bedtime as needed for sleep. 30 tablet 3  . vitamin B-12  (CYANOCOBALAMIN) 1000 MCG tablet Take 1,000 mcg by mouth daily.    . vitamin E 400 UNIT capsule Take 200 Units by mouth daily.      No current facility-administered medications for this visit.    Facility-Administered Medications Ordered in Other Visits  Medication Dose Route Frequency Provider Last Rate Last Dose  . heparin lock flush 100 unit/mL  500 Units Intravenous Once Lequita Asal, MD      . sodium chloride 0.9 % injection 10 mL  10 mL Intravenous PRN Lequita Asal, MD   10 mL at 03/03/15 0903  . sodium chloride 0.9 % injection 10 mL  10 mL Intracatheter PRN Lequita Asal, MD   10 mL at 03/10/15 1410   Review of Systems:  GENERAL:  Feels "a little weak".  No fevers or sweats.  Weight down 4 pounds. PERFORMANCE STATUS (ECOG):  1 HEENT: No visual changes, runny nose, sore throat. Pulmonary:  Chronic shortness of breath on exertion.  No cough.  No hemoptysis. Cardiac:  No chest pain, palpitations, orthopnea, or PND. GI:  Appetite good.  No nausea, vomiting, diarrhea, constipation, melena or hematochezia.  GU:  No urgency, frequency, dysuria, or hematuria.  On Flomax. Musculoskeletal:  No back pain.  No joint pain.  No muscle tenderness. Extremities:  No pain or swelling. Skin:  No rashes or skin changes. Neuro:  No headache, numbness or weakness, balance or coordination issues. Endocrine:  Diabetes.  No thyroid issues, hot flashes or night sweats. Psych:  No mood changes, depression or anxiety.  Pain:  No focal pain. Review of systems:  All other systems reviewed and found to be negative.   Physical Exam: Blood pressure 128/71, pulse 76, temperature 98.4 F (36.9 C), resp. rate 18, weight 214 lb 9 oz (97.3 kg).  GENERAL:  Well developed, well nourished, gentleman sitting comfortably in the exam room in no acute distress. MENTAL STATUS:  Alert and oriented to person, place and time. HEAD:  Pearline Cables white hair.  Normocephalic, atraumatic, face symmetric, no Cushingoid  features. EYES:  Glasses. Blue eyes.  Pupils equal round and reactive to light and accomodation.  No conjunctivitis or scleral icterus. ENT:  Oropharynx clear without lesion.  Tongue normal. Mucous membranes moist.  RESPIRATORY:  Clear to auscultation without rales, wheezes or rhonchi. CARDIOVASCULAR:  Regular rate and rhythm without murmur, rub or gallop. ABDOMEN:  Soft, non-tender, with active bowel sounds, and no hepatosplenomegaly.  No masses. SKIN:  No rashes or ulcers. EXTREMITIES: No edema, no skin discoloration or tenderness.  No palpable cords. LYMPH NODES: No palpable cervical, supraclavicular, axillary or inguinal adenopathy  NEUROLOGICAL: Unremarkable. PSYCH:  Appropriate.    Appointment on 11/26/2016  Component Date Value Ref Range Status  . WBC 11/26/2016 2.3* 3.8 - 10.6 K/uL Final  . RBC 11/26/2016 2.20* 4.40 - 5.90 MIL/uL Final  . Hemoglobin 11/26/2016 8.3* 13.0 - 18.0 g/dL Final  . HCT 11/26/2016 24.1* 40.0 - 52.0 % Final  . MCV 11/26/2016 109.7* 80.0 - 100.0 fL Final  . MCH 11/26/2016 37.8* 26.0 - 34.0 pg Final  . MCHC 11/26/2016 34.5  32.0 - 36.0 g/dL Final  . RDW 11/26/2016 16.2* 11.5 - 14.5 % Final  . Platelets 11/26/2016 85* 150 - 440 K/uL Final  . Neutrophils Relative % 11/26/2016 65  % Final  . Lymphocytes Relative 11/26/2016 22  % Final  . Monocytes Relative 11/26/2016 5  % Final  . Eosinophils Relative 11/26/2016 7  % Final  . Basophils Relative 11/26/2016 1  % Final  . Neutro Abs 11/26/2016 1.5  1.4 - 6.5 K/uL Final  . Lymphs Abs 11/26/2016 0.5* 1.0 - 3.6 K/uL Final  . Monocytes Absolute 11/26/2016 0.1* 0.2 - 1.0 K/uL Final  . Eosinophils Absolute 11/26/2016 0.2  0 - 0.7 K/uL Final  . Basophils Absolute 11/26/2016 0.0  0 - 0.1 K/uL Final    Assessment:  Alex Smith is a 82 y.o. 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  He was diagnosed with hairy  cell leukemia in 2006.  He received cladribine 11/06-11/05/2005.  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   Bone marrow aspirate and biopsy on 02/07/2015 revealed a extensive marrow involvement by recurrent hairy cell leukemia (approximately 80% of cells in the core). There was a monoclonal plasma cell infiltrate (approximately 10%), compatible with a plasma cell neoplasm. Marrow was hypercellular (40-50%) with markedly diminished residual trilineage hematopoiesis and mild multilineage dyspoiesis.  Peripheral smear revealed leukopenia (WBC 1900) with 2% atypical lymphoid cells compatible with hairy cell leukemia. There was moderate anemia with anisopoikilocytosis. 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  FISH studies for MDS were negative.  Cytogenetics were normal (46, XY).  He has an unclear allergy history.  He may be allergic to Primaxin, Zoloft, allopurinol, and a chemotherapy medication.  Reaction occurred in 06/2005.  He is allergic to sulfa.  He is not allergic to dapsone.  He requires washed RBCs.  He is on prophylactic acyclovir.  Dapsone and voriconazole were discontinued.  He has a history of shingles prior to prophylactic acyclovir.  He is 17 months status post cladribine (began 03/03/2015).  His counts plateaued on 07/25/2015 and have subsequently decreased.  Labs on 07/25/2015 revealed a normal B12 (894), folate (60.5), and TSH (2.562).  Ferritin was 50 on 04/26/2016.  Labs on 08/22/2016 revealed a normal B12, folate, and TSH.  He is on oral B12.  Bone marrow on 11/03/2015 revealed persistent plasma cell neoplasm, now with mildy atypical monoclonal plasma cells estimated at 20-30%.   There was no morphologic evidence of residual hairy cell leukemia.  There was a minute population (0.03%) with hairy cell immunophenotype detected by flow.  Marrow was normocellular for age with relative erythroid hyperplasia, relative myeloid hypoplasia, mild dyspoiesis, adequate megakaryocytes and no  increased blasts.  There was no significant increase in marrow reticulin fibers.  Iron was present.  FISH studies for MDS were negative.  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  Cytogenetics were normal (46, XY).  Bone marrow on 03/13/2016 was normocellular to marginally hypercellular for age (20-40%) with relative erythroid hyperplasia and mild dyserythropoiesis, decreased granulopoiesis and adequate megakaryocytes.  There was a persistent 15-20% plasma cell neoplasm.  There was no overt increase in reticulin fibrosis.  Storage iron was present.  Flow cytometry revealed abnormal/monocytic plasma cell population (3% sample), consistent with a plasma cell neoplastic process.  Cytogenetics were normal (46, XY).  FISH studies for myeloma revealed a CCND1/IGH translocation, t(11;14) in 53% of nuclei.  SPEP has been followed: 1.0 on 02/03/2015, 1.4 gm/dL on 07/25/2015, 1.3 gm/dL on 11/17/2015, 1.2 gm/dL on 06/28/2016 and 1.0 gm/dL on 09/20/2016.    Kappa free light chains have been followed: 47.63 (ratio 3.12) on 02/03/2015, 54.9 (ratio 3.14) on 02/07/2015, 46.23 (ratio 3.68) on 07/25/2015, and 50.5 (ratio 4.82) on 11/17/2015.  Beta2-microglobulin was 2.5 on 11/17/2015.  24 hour urine on 11/21/2015 revealed 234.8 mg protein in 24 hours (no monoclonal protein).  Bone survey on 11/24/2015 revealed no lytic lesions.  Epo level was 329.6 on 03/29/2016.  He began a trial of Procrit.  He received 10,000 units of Procrit weekly (04/26/2016 - 06/14/2016).  Hemoglobin ranged between 8.4 and 8.9 without trend.    He is s/p 1 cycle ofRevlimid (10 mg) and Decadron(06/29/2016 - 07/19/2016). Cycle #1 was complicated by anemia and neutropenia. He has received 3 units of PRBCs(1 unit:07/22/2016  and 2 units: 08/07/2016). He received 6 days ofGCSF (last 08/05/2016).  He is currently day 15 of cycle #3 Revlimid (29m) and Decadron (09/14/2016 - 11/08/2016).  He began  weekly GCSF on 11/05/2016.  He receives GCSF if his ANC is <= 1500.  He is on aspirin 81 mg a day prophylaxis.  PET scan on 03/14/2016 revealed no evidence of hypermetabolic lymphadenopathy or focal skeletal lesions to suggest marrow or cortical bone involvement.  Symptomatically, he is a little fatigued.  Exam is stable.  Counts include a hematocrit 24.1, hemoglobin of 8.5, platelets 85,000 and ANC 1500.  Plan: 1.  Labs today:  CBC with diff, hold tube. 2.  Continue Revlimid 5 mg a day (3 weeks on and 1 week off). 3.  GCSF 480 mcg today. 4.  RTC weekly x 3 for labs (CBC with diff) +/- Neupogen. 5.  Add to next week's labs only: CMP, Mg. 6.  RTC in 4 weeks for MD assessment, labs (CBC with diff, CMP, Mg) and +/- Neupogen.   MLequita Asal MD  11/26/2016, 11:18 AM

## 2016-11-26 NOTE — Progress Notes (Signed)
Patient states he has not felt 100% for the past several weeks. Patient c/o constipation.  States he usually goes once a day.  Patient states he is almost out of his Revlimid.

## 2016-12-02 ENCOUNTER — Telehealth: Payer: Self-pay | Admitting: Family Medicine

## 2016-12-02 NOTE — Telephone Encounter (Signed)
Opened in error

## 2016-12-03 ENCOUNTER — Other Ambulatory Visit: Payer: Self-pay | Admitting: Hematology and Oncology

## 2016-12-03 ENCOUNTER — Inpatient Hospital Stay: Payer: Medicare Other

## 2016-12-03 ENCOUNTER — Other Ambulatory Visit: Payer: Self-pay | Admitting: *Deleted

## 2016-12-03 ENCOUNTER — Other Ambulatory Visit: Payer: Self-pay

## 2016-12-03 ENCOUNTER — Telehealth: Payer: Self-pay | Admitting: *Deleted

## 2016-12-03 DIAGNOSIS — Z7982 Long term (current) use of aspirin: Secondary | ICD-10-CM | POA: Diagnosis not present

## 2016-12-03 DIAGNOSIS — Z7689 Persons encountering health services in other specified circumstances: Secondary | ICD-10-CM | POA: Diagnosis not present

## 2016-12-03 DIAGNOSIS — D46Z Other myelodysplastic syndromes: Secondary | ICD-10-CM

## 2016-12-03 DIAGNOSIS — K5900 Constipation, unspecified: Secondary | ICD-10-CM | POA: Diagnosis not present

## 2016-12-03 DIAGNOSIS — N400 Enlarged prostate without lower urinary tract symptoms: Secondary | ICD-10-CM | POA: Diagnosis not present

## 2016-12-03 DIAGNOSIS — E119 Type 2 diabetes mellitus without complications: Secondary | ICD-10-CM | POA: Diagnosis not present

## 2016-12-03 DIAGNOSIS — E538 Deficiency of other specified B group vitamins: Secondary | ICD-10-CM | POA: Diagnosis not present

## 2016-12-03 DIAGNOSIS — Z856 Personal history of leukemia: Secondary | ICD-10-CM | POA: Diagnosis not present

## 2016-12-03 DIAGNOSIS — D649 Anemia, unspecified: Secondary | ICD-10-CM | POA: Diagnosis not present

## 2016-12-03 DIAGNOSIS — K219 Gastro-esophageal reflux disease without esophagitis: Secondary | ICD-10-CM | POA: Diagnosis not present

## 2016-12-03 DIAGNOSIS — R5383 Other fatigue: Secondary | ICD-10-CM | POA: Diagnosis not present

## 2016-12-03 DIAGNOSIS — Z794 Long term (current) use of insulin: Secondary | ICD-10-CM | POA: Diagnosis not present

## 2016-12-03 DIAGNOSIS — D539 Nutritional anemia, unspecified: Secondary | ICD-10-CM

## 2016-12-03 DIAGNOSIS — E785 Hyperlipidemia, unspecified: Secondary | ICD-10-CM | POA: Diagnosis not present

## 2016-12-03 DIAGNOSIS — C9000 Multiple myeloma not having achieved remission: Secondary | ICD-10-CM

## 2016-12-03 DIAGNOSIS — C9141 Hairy cell leukemia, in remission: Secondary | ICD-10-CM

## 2016-12-03 DIAGNOSIS — Z86010 Personal history of colonic polyps: Secondary | ICD-10-CM | POA: Diagnosis not present

## 2016-12-03 DIAGNOSIS — Z85828 Personal history of other malignant neoplasm of skin: Secondary | ICD-10-CM | POA: Diagnosis not present

## 2016-12-03 DIAGNOSIS — I10 Essential (primary) hypertension: Secondary | ICD-10-CM | POA: Diagnosis not present

## 2016-12-03 DIAGNOSIS — Z8619 Personal history of other infectious and parasitic diseases: Secondary | ICD-10-CM | POA: Diagnosis not present

## 2016-12-03 LAB — CBC WITH DIFFERENTIAL/PLATELET
Basophils Absolute: 0 10*3/uL (ref 0–0.1)
Basophils Relative: 3 %
Eosinophils Absolute: 0.1 10*3/uL (ref 0–0.7)
Eosinophils Relative: 3 %
HCT: 22.4 % — ABNORMAL LOW (ref 40.0–52.0)
Hemoglobin: 7.7 g/dL — ABNORMAL LOW (ref 13.0–18.0)
Lymphocytes Relative: 20 %
Lymphs Abs: 0.3 10*3/uL — ABNORMAL LOW (ref 1.0–3.6)
MCH: 37.3 pg — ABNORMAL HIGH (ref 26.0–34.0)
MCHC: 34.3 g/dL (ref 32.0–36.0)
MCV: 108.8 fL — ABNORMAL HIGH (ref 80.0–100.0)
Monocytes Absolute: 0.1 10*3/uL — ABNORMAL LOW (ref 0.2–1.0)
Monocytes Relative: 6 %
Neutro Abs: 1.1 10*3/uL — ABNORMAL LOW (ref 1.4–6.5)
Neutrophils Relative %: 68 %
Platelets: 73 10*3/uL — ABNORMAL LOW (ref 150–440)
RBC: 2.06 MIL/uL — ABNORMAL LOW (ref 4.40–5.90)
RDW: 16 % — ABNORMAL HIGH (ref 11.5–14.5)
WBC: 1.7 10*3/uL — ABNORMAL LOW (ref 3.8–10.6)

## 2016-12-03 LAB — COMPREHENSIVE METABOLIC PANEL
ALT: 23 U/L (ref 17–63)
AST: 27 U/L (ref 15–41)
Albumin: 3.4 g/dL — ABNORMAL LOW (ref 3.5–5.0)
Alkaline Phosphatase: 61 U/L (ref 38–126)
Anion gap: 6 (ref 5–15)
BUN: 26 mg/dL — ABNORMAL HIGH (ref 6–20)
CO2: 24 mmol/L (ref 22–32)
Calcium: 8.8 mg/dL — ABNORMAL LOW (ref 8.9–10.3)
Chloride: 106 mmol/L (ref 101–111)
Creatinine, Ser: 1.43 mg/dL — ABNORMAL HIGH (ref 0.61–1.24)
GFR calc Af Amer: 52 mL/min — ABNORMAL LOW (ref >60)
GFR calc non Af Amer: 45 mL/min — ABNORMAL LOW (ref >60)
Glucose, Bld: 222 mg/dL — ABNORMAL HIGH (ref 65–99)
Potassium: 4.2 mmol/L (ref 3.5–5.1)
Sodium: 136 mmol/L (ref 135–145)
Total Bilirubin: 0.6 mg/dL (ref 0.3–1.2)
Total Protein: 6.5 g/dL (ref 6.5–8.1)

## 2016-12-03 LAB — MAGNESIUM: Magnesium: 1.8 mg/dL (ref 1.7–2.4)

## 2016-12-03 LAB — PREPARE RBC (CROSSMATCH)

## 2016-12-03 MED ORDER — FILGRASTIM 480 MCG/0.8ML IJ SOSY
480.0000 ug | PREFILLED_SYRINGE | Freq: Once | INTRAMUSCULAR | Status: DC
Start: 2016-12-03 — End: 2016-12-03

## 2016-12-03 MED ORDER — TBO-FILGRASTIM 480 MCG/0.8ML ~~LOC~~ SOSY
480.0000 ug | PREFILLED_SYRINGE | Freq: Once | SUBCUTANEOUS | Status: AC
  Administered 2016-12-03: 480 ug via SUBCUTANEOUS
  Filled 2016-12-03: qty 0.8

## 2016-12-03 NOTE — Telephone Encounter (Signed)
Called to inform patient that his appt for blood transfusion had been changed to the [facility], voiced understanding.

## 2016-12-03 NOTE — Progress Notes (Signed)
Patient's hemoglobin today is 7.7.  Patient reports SOBr and weakness but nor more than his baseline.  Also reports that he feels that he would benefit from a blood transfusion.  Patient scheduled for blood transfusion in [place] tomorrow (12/04/16), Dr. Kem Parkinson team is aware.

## 2016-12-04 ENCOUNTER — Inpatient Hospital Stay: Payer: Medicare Other

## 2016-12-04 ENCOUNTER — Ambulatory Visit: Payer: Medicare Other

## 2016-12-04 DIAGNOSIS — K219 Gastro-esophageal reflux disease without esophagitis: Secondary | ICD-10-CM | POA: Diagnosis not present

## 2016-12-04 DIAGNOSIS — D649 Anemia, unspecified: Secondary | ICD-10-CM | POA: Diagnosis not present

## 2016-12-04 DIAGNOSIS — E538 Deficiency of other specified B group vitamins: Secondary | ICD-10-CM | POA: Diagnosis not present

## 2016-12-04 DIAGNOSIS — E119 Type 2 diabetes mellitus without complications: Secondary | ICD-10-CM | POA: Diagnosis not present

## 2016-12-04 DIAGNOSIS — Z8619 Personal history of other infectious and parasitic diseases: Secondary | ICD-10-CM | POA: Diagnosis not present

## 2016-12-04 DIAGNOSIS — Z85828 Personal history of other malignant neoplasm of skin: Secondary | ICD-10-CM | POA: Diagnosis not present

## 2016-12-04 DIAGNOSIS — Z794 Long term (current) use of insulin: Secondary | ICD-10-CM | POA: Diagnosis not present

## 2016-12-04 DIAGNOSIS — Z7982 Long term (current) use of aspirin: Secondary | ICD-10-CM | POA: Diagnosis not present

## 2016-12-04 DIAGNOSIS — Z86010 Personal history of colonic polyps: Secondary | ICD-10-CM | POA: Diagnosis not present

## 2016-12-04 DIAGNOSIS — K5900 Constipation, unspecified: Secondary | ICD-10-CM | POA: Diagnosis not present

## 2016-12-04 DIAGNOSIS — N400 Enlarged prostate without lower urinary tract symptoms: Secondary | ICD-10-CM | POA: Diagnosis not present

## 2016-12-04 DIAGNOSIS — R5383 Other fatigue: Secondary | ICD-10-CM | POA: Diagnosis not present

## 2016-12-04 DIAGNOSIS — Z856 Personal history of leukemia: Secondary | ICD-10-CM | POA: Diagnosis not present

## 2016-12-04 DIAGNOSIS — E785 Hyperlipidemia, unspecified: Secondary | ICD-10-CM | POA: Diagnosis not present

## 2016-12-04 DIAGNOSIS — D46Z Other myelodysplastic syndromes: Secondary | ICD-10-CM | POA: Diagnosis not present

## 2016-12-04 DIAGNOSIS — I10 Essential (primary) hypertension: Secondary | ICD-10-CM | POA: Diagnosis not present

## 2016-12-04 DIAGNOSIS — Z7689 Persons encountering health services in other specified circumstances: Secondary | ICD-10-CM | POA: Diagnosis not present

## 2016-12-04 MED ORDER — ACETAMINOPHEN 325 MG PO TABS
650.0000 mg | ORAL_TABLET | Freq: Once | ORAL | Status: AC
  Administered 2016-12-04: 650 mg via ORAL
  Filled 2016-12-04: qty 2

## 2016-12-04 MED ORDER — SODIUM CHLORIDE 0.9 % IV SOLN
250.0000 mL | Freq: Once | INTRAVENOUS | Status: AC
  Administered 2016-12-04: 250 mL via INTRAVENOUS
  Filled 2016-12-04: qty 250

## 2016-12-04 MED ORDER — DIPHENHYDRAMINE HCL 25 MG PO CAPS
25.0000 mg | ORAL_CAPSULE | Freq: Once | ORAL | Status: AC
  Administered 2016-12-04: 25 mg via ORAL
  Filled 2016-12-04: qty 1

## 2016-12-04 MED ORDER — SODIUM CHLORIDE 0.9% FLUSH
10.0000 mL | INTRAVENOUS | Status: AC | PRN
  Administered 2016-12-04: 10 mL
  Filled 2016-12-04: qty 10

## 2016-12-04 MED ORDER — HEPARIN SOD (PORK) LOCK FLUSH 100 UNIT/ML IV SOLN
500.0000 [IU] | Freq: Every day | INTRAVENOUS | Status: AC | PRN
  Administered 2016-12-04: 500 [IU]
  Filled 2016-12-04: qty 5

## 2016-12-05 LAB — BPAM RBC
Blood Product Expiration Date: 201804181530
ISSUE DATE / TIME: 201804180925
Unit Type and Rh: 600

## 2016-12-05 LAB — TYPE AND SCREEN
ABO/RH(D): A NEG
Antibody Screen: NEGATIVE
Unit division: 0

## 2016-12-06 ENCOUNTER — Ambulatory Visit: Payer: Medicare Other

## 2016-12-06 ENCOUNTER — Other Ambulatory Visit: Payer: Medicare Other

## 2016-12-06 ENCOUNTER — Ambulatory Visit: Payer: Medicare Other | Admitting: Hematology and Oncology

## 2016-12-08 ENCOUNTER — Other Ambulatory Visit: Payer: Self-pay | Admitting: Family Medicine

## 2016-12-08 DIAGNOSIS — Z794 Long term (current) use of insulin: Principal | ICD-10-CM

## 2016-12-08 DIAGNOSIS — E538 Deficiency of other specified B group vitamins: Secondary | ICD-10-CM

## 2016-12-08 DIAGNOSIS — E11319 Type 2 diabetes mellitus with unspecified diabetic retinopathy without macular edema: Secondary | ICD-10-CM

## 2016-12-08 DIAGNOSIS — E785 Hyperlipidemia, unspecified: Secondary | ICD-10-CM

## 2016-12-09 ENCOUNTER — Other Ambulatory Visit: Payer: Self-pay | Admitting: Family Medicine

## 2016-12-10 ENCOUNTER — Inpatient Hospital Stay: Payer: Medicare Other

## 2016-12-10 ENCOUNTER — Ambulatory Visit: Payer: Medicare Other

## 2016-12-10 DIAGNOSIS — D46Z Other myelodysplastic syndromes: Secondary | ICD-10-CM

## 2016-12-10 DIAGNOSIS — E538 Deficiency of other specified B group vitamins: Secondary | ICD-10-CM | POA: Diagnosis not present

## 2016-12-10 DIAGNOSIS — Z7689 Persons encountering health services in other specified circumstances: Secondary | ICD-10-CM | POA: Diagnosis not present

## 2016-12-10 DIAGNOSIS — K219 Gastro-esophageal reflux disease without esophagitis: Secondary | ICD-10-CM | POA: Diagnosis not present

## 2016-12-10 DIAGNOSIS — Z856 Personal history of leukemia: Secondary | ICD-10-CM | POA: Diagnosis not present

## 2016-12-10 DIAGNOSIS — Z794 Long term (current) use of insulin: Secondary | ICD-10-CM | POA: Diagnosis not present

## 2016-12-10 DIAGNOSIS — Z7982 Long term (current) use of aspirin: Secondary | ICD-10-CM | POA: Diagnosis not present

## 2016-12-10 DIAGNOSIS — Z86010 Personal history of colonic polyps: Secondary | ICD-10-CM | POA: Diagnosis not present

## 2016-12-10 DIAGNOSIS — K5900 Constipation, unspecified: Secondary | ICD-10-CM | POA: Diagnosis not present

## 2016-12-10 DIAGNOSIS — D649 Anemia, unspecified: Secondary | ICD-10-CM | POA: Diagnosis not present

## 2016-12-10 DIAGNOSIS — R5383 Other fatigue: Secondary | ICD-10-CM | POA: Diagnosis not present

## 2016-12-10 DIAGNOSIS — C9141 Hairy cell leukemia, in remission: Secondary | ICD-10-CM

## 2016-12-10 DIAGNOSIS — Z85828 Personal history of other malignant neoplasm of skin: Secondary | ICD-10-CM | POA: Diagnosis not present

## 2016-12-10 DIAGNOSIS — I10 Essential (primary) hypertension: Secondary | ICD-10-CM | POA: Diagnosis not present

## 2016-12-10 DIAGNOSIS — C9000 Multiple myeloma not having achieved remission: Secondary | ICD-10-CM

## 2016-12-10 DIAGNOSIS — E785 Hyperlipidemia, unspecified: Secondary | ICD-10-CM | POA: Diagnosis not present

## 2016-12-10 DIAGNOSIS — Z8619 Personal history of other infectious and parasitic diseases: Secondary | ICD-10-CM | POA: Diagnosis not present

## 2016-12-10 DIAGNOSIS — N400 Enlarged prostate without lower urinary tract symptoms: Secondary | ICD-10-CM | POA: Diagnosis not present

## 2016-12-10 DIAGNOSIS — E119 Type 2 diabetes mellitus without complications: Secondary | ICD-10-CM | POA: Diagnosis not present

## 2016-12-10 LAB — CBC WITH DIFFERENTIAL/PLATELET
Basophils Absolute: 0 10*3/uL (ref 0–0.1)
Basophils Relative: 1 %
Eosinophils Absolute: 0.2 10*3/uL (ref 0–0.7)
Eosinophils Relative: 10 %
HCT: 26.8 % — ABNORMAL LOW (ref 40.0–52.0)
Hemoglobin: 9.3 g/dL — ABNORMAL LOW (ref 13.0–18.0)
Lymphocytes Relative: 26 %
Lymphs Abs: 0.6 10*3/uL — ABNORMAL LOW (ref 1.0–3.6)
MCH: 36.5 pg — ABNORMAL HIGH (ref 26.0–34.0)
MCHC: 34.7 g/dL (ref 32.0–36.0)
MCV: 105.3 fL — ABNORMAL HIGH (ref 80.0–100.0)
Monocytes Absolute: 0.1 10*3/uL — ABNORMAL LOW (ref 0.2–1.0)
Monocytes Relative: 3 %
Neutro Abs: 1.4 10*3/uL (ref 1.4–6.5)
Neutrophils Relative %: 60 %
Platelets: 79 10*3/uL — ABNORMAL LOW (ref 150–440)
RBC: 2.55 MIL/uL — ABNORMAL LOW (ref 4.40–5.90)
RDW: 19.8 % — ABNORMAL HIGH (ref 11.5–14.5)
WBC: 2.4 10*3/uL — ABNORMAL LOW (ref 3.8–10.6)

## 2016-12-10 MED ORDER — TBO-FILGRASTIM 480 MCG/0.8ML ~~LOC~~ SOSY: 480.0000 ug | PREFILLED_SYRINGE | Freq: Once | SUBCUTANEOUS | Status: DC

## 2016-12-10 MED ORDER — TBO-FILGRASTIM 480 MCG/0.8ML ~~LOC~~ SOSY
480.0000 ug | PREFILLED_SYRINGE | Freq: Once | SUBCUTANEOUS | Status: AC
  Administered 2016-12-10: 480 ug via SUBCUTANEOUS

## 2016-12-10 MED ORDER — FILGRASTIM 480 MCG/0.8ML IJ SOSY: 480.0000 ug | PREFILLED_SYRINGE | Freq: Once | INTRAMUSCULAR | Status: DC

## 2016-12-10 MED ORDER — TBO-FILGRASTIM 480 MCG/0.8ML ~~LOC~~ SOSY
480.0000 ug | PREFILLED_SYRINGE | Freq: Once | SUBCUTANEOUS | Status: DC
  Filled 2016-12-10: qty 0.8

## 2016-12-10 NOTE — Progress Notes (Signed)
Confirmed with Dr. [name] to proceed with Granix injection today.

## 2016-12-11 ENCOUNTER — Ambulatory Visit (INDEPENDENT_AMBULATORY_CARE_PROVIDER_SITE_OTHER): Payer: Medicare Other

## 2016-12-11 ENCOUNTER — Other Ambulatory Visit (INDEPENDENT_AMBULATORY_CARE_PROVIDER_SITE_OTHER): Payer: Medicare Other

## 2016-12-11 DIAGNOSIS — E13319 Other specified diabetes mellitus with unspecified diabetic retinopathy without macular edema: Secondary | ICD-10-CM | POA: Diagnosis not present

## 2016-12-11 DIAGNOSIS — E785 Hyperlipidemia, unspecified: Secondary | ICD-10-CM

## 2016-12-11 DIAGNOSIS — Z794 Long term (current) use of insulin: Secondary | ICD-10-CM | POA: Diagnosis not present

## 2016-12-11 DIAGNOSIS — E11319 Type 2 diabetes mellitus with unspecified diabetic retinopathy without macular edema: Secondary | ICD-10-CM

## 2016-12-11 LAB — LIPID PANEL
CHOL/HDL RATIO: 3
Cholesterol: 138 mg/dL (ref 0–200)
HDL: 41.9 mg/dL (ref >39.00)
NONHDL: 96.18
Triglycerides: 248 mg/dL — ABNORMAL HIGH (ref 0.0–149.0)
VLDL: 49.6 mg/dL — AB (ref 0.0–40.0)

## 2016-12-11 LAB — LDL CHOLESTEROL, DIRECT: Direct LDL: 69 mg/dL

## 2016-12-11 LAB — HEMOGLOBIN A1C: HEMOGLOBIN A1C: 7 % — AB (ref 4.6–6.5)

## 2016-12-11 NOTE — Progress Notes (Signed)
Pre visit review using our clinic review tool, if applicable. No additional management support is needed unless otherwise documented below in the visit note. 

## 2016-12-11 NOTE — Progress Notes (Signed)
Patient had AWV in Oct 2017. This encounter will not be billed.

## 2016-12-12 ENCOUNTER — Other Ambulatory Visit: Payer: Self-pay | Admitting: Family Medicine

## 2016-12-13 ENCOUNTER — Encounter: Payer: Self-pay | Admitting: Family Medicine

## 2016-12-13 ENCOUNTER — Ambulatory Visit (INDEPENDENT_AMBULATORY_CARE_PROVIDER_SITE_OTHER): Payer: Medicare Other | Admitting: Family Medicine

## 2016-12-13 VITALS — BP 152/60 | HR 92 | Temp 98.1°F | Wt 211.5 lb

## 2016-12-13 DIAGNOSIS — R011 Cardiac murmur, unspecified: Secondary | ICD-10-CM | POA: Diagnosis not present

## 2016-12-13 DIAGNOSIS — C9141 Hairy cell leukemia, in remission: Secondary | ICD-10-CM

## 2016-12-13 DIAGNOSIS — Z794 Long term (current) use of insulin: Secondary | ICD-10-CM | POA: Diagnosis not present

## 2016-12-13 DIAGNOSIS — E11319 Type 2 diabetes mellitus with unspecified diabetic retinopathy without macular edema: Secondary | ICD-10-CM | POA: Diagnosis not present

## 2016-12-13 DIAGNOSIS — I10 Essential (primary) hypertension: Secondary | ICD-10-CM

## 2016-12-13 NOTE — Assessment & Plan Note (Signed)
Appreciate onc care.  

## 2016-12-13 NOTE — Patient Instructions (Signed)
A1c is doing well. Continue current medicines. I recommend checking sugars twice daily (before each insulin dosing) for 3 days after your steroid pills to be able to dose sliding scale insulin accurately.  Good to see you today, call us with questions. Return in 3 months for diabetes follow up

## 2016-12-13 NOTE — Assessment & Plan Note (Addendum)
Chronic, stable period. A1c well controlled for age at 7.0%. Pt unsure about SSI (2d after decadron PO), he is only checking sugars once daily. Advised to check twice daily to dose SSI accordingly with meals. We checked with pharmacist to ensure pt able to mix both humulin insulins together. They say he can.

## 2016-12-13 NOTE — Assessment & Plan Note (Signed)
Chronic, stable 

## 2016-12-13 NOTE — Progress Notes (Addendum)
BP (!) 152/60   Pulse 92   Temp 98.1 F (36.7 C) (Oral)   Wt 211 lb 8 oz (95.9 kg)   BMI 34.66 kg/m    CC: 62mo f/u visit Subjective:    Patient ID: Alex Smith, male    DOB: 07/09/1935, 82 y.o.   MRN: [id number]  HPI: JAIDEEP Smith is a 82 y.o. male presenting on 12/13/2016 for Follow-up   Last AMW was 05/2016. Hairy cell leukemia followed by Dr [name] last seen 11/26/2016 and note pending, also with smoldering stage I MM and early myelodysplasia. On revlimid 21d cycle. Taking decadron 20mg  PO weekly as well.   He did have fall 1 month ago due to weakness. Sugar was ok.   DM - regularly does check sugars fasting in am: this morning 300s after decadron. Gets Compliant with antihyperglycemic regimen which includes: humulin R 10u BID, Humulin N NPH 39u BID, metformin 1000mg  bid. He mixes humulins together to give himself 2 shots daily total. Denies low sugars or hypoglycemic symptoms. Denies paresthesias. Last diabetic eye exam 01/2016.  Pneumovax: 2006.  Prevnar: 2015.  Lab Results  Component Value Date   HGBA1C 7.0 (H) 12/11/2016   Diabetic Foot Exam - Simple   No data filed       Adds sliding scale regular insulin to mealtime insulin BID AC as follows on day of and day after decadron: no extra insulin of cbg <150. 1 unit if cbg 150-200. 2 units if 200-250. 3 units if cbg 250-300. 4 units if cbg 300-350, 5 units of >350. SSI will only be for Saturday and Sunday.   Relevant past medical, surgical, family and social history reviewed and updated as indicated. Interim medical history since our last visit reviewed. Allergies and medications reviewed and updated. Outpatient Medications Prior to Visit  Medication Sig Dispense Refill  . acyclovir (ZOVIRAX) 400 MG tablet TAKE TWO TABLETS TWICE DAILY 120 tablet 6  . aspirin EC 81 MG tablet Take 81 mg by mouth daily.    Marland Kitchen dexamethasone (DECADRON) 4 MG tablet Take 5 tablets (20 mg total) by mouth once a week. 20 tablet 1  . glucose  blood (ACCU-CHEK AVIVA PLUS) test strip Use to check sugar twice daily Dx:E11.9 200 each 3  . HUMULIN R 100 UNIT/ML injection INJECT 10 UNITS SUBCUTANEOUSLY TWICE DAILY BEFORE MEALS 10 mL 6  . hydrochlorothiazide (HYDRODIURIL) 25 MG tablet TAKE ONE TABLET EVERY DAY 30 tablet 5  . insulin NPH Human (HUMULIN N) 100 UNIT/ML injection Inject 0.39 mLs (39 Units total) into the skin 2 (two) times daily before a meal. 20 mL 6  . lenalidomide (REVLIMID) 5 MG capsule 5 mg a day for 21 days then 7 days off 21 capsule 0  . lidocaine-prilocaine (EMLA) cream Apply 1 application topically as needed. 30 g 3  . lisinopril (PRINIVIL,ZESTRIL) 20 MG tablet TAKE 1 TABLET BY MOUTH TWICE DAILY 60 tablet 6  . metFORMIN (GLUCOPHAGE) 1000 MG tablet TAKE ONE TABLET BY MOUTH TWICE DAILY AS DIRECTED 180 tablet 3  . MICROLET LANCETS MISC USE AS DIRECTED 200 each 3  . pravastatin (PRAVACHOL) 20 MG tablet TAKE ONE TABLET AT BEDTIME 30 tablet 6  . ranitidine (ZANTAC) 150 MG tablet TAKE 1 TABLET BY MOUTH TWICE DAILY AS NEEDED 180 tablet 3  . tamsulosin (FLOMAX) 0.4 MG CAPS capsule Take 2 capsules (0.8 mg total) by mouth daily. 60 capsule 1  . temazepam (RESTORIL) 7.5 MG capsule Take 7.5 mg by mouth once.     Marland Kitchen  traZODone (DESYREL) 50 MG tablet Take 0.5-1 tablets (25-50 mg total) by mouth at bedtime as needed for sleep. 30 tablet 3  . vitamin E 400 UNIT capsule Take 200 Units by mouth daily.     . vitamin B-12 (CYANOCOBALAMIN) 1000 MCG tablet Take 1,000 mcg by mouth daily.     Facility-Administered Medications Prior to Visit  Medication Dose Route Frequency Provider Last Rate Last Dose  . heparin lock flush 100 unit/mL  500 Units Intravenous Once Lequita Asal, MD      . sodium chloride 0.9 % injection 10 mL  10 mL Intravenous PRN Lequita Asal, MD   10 mL at 03/03/15 0903  . sodium chloride 0.9 % injection 10 mL  10 mL Intracatheter PRN Lequita Asal, MD   10 mL at 03/10/15 1410  . Tbo-Filgrastim (GRANIX)  injection 480 mcg  480 mcg Subcutaneous Once Lequita Asal, MD         Per HPI unless specifically indicated in ROS section below Review of Systems     Objective:    BP (!) 152/60   Pulse 92   Temp 98.1 F (36.7 C) (Oral)   Wt 211 lb 8 oz (95.9 kg)   BMI 34.66 kg/m   Wt Readings from Last 3 Encounters:  12/13/16 211 lb 8 oz (95.9 kg)  11/26/16 214 lb 9 oz (97.3 kg)  11/19/16 214 lb 3 oz (97.2 kg)    Physical Exam  Constitutional: He appears well-developed and well-nourished. No distress.  Cardiovascular: Normal rate, regular rhythm and intact distal pulses.   Murmur (3/6 SEM at LUSB) heard. Pulmonary/Chest: Effort normal and breath sounds normal. No respiratory distress. He has no wheezes. He has no rales.  Musculoskeletal: He exhibits edema (tr).  Psychiatric: He has a normal mood and affect.  Nursing note and vitals reviewed.  Results for orders placed or performed in visit on 12/11/16  Lipid Panel  Result Value Ref Range   Cholesterol 138 0 - 200 mg/dL   Triglycerides 248.0 (H) 0.0 - 149.0 mg/dL   HDL 41.90 >39.00 mg/dL   VLDL 49.6 (H) 0.0 - 40.0 mg/dL   Total CHOL/HDL Ratio 3    NonHDL 96.18   Hemoglobin A1c  Result Value Ref Range   Hgb A1c MFr Bld 7.0 (H) 4.6 - 6.5 %  LDL cholesterol, direct  Result Value Ref Range   Direct LDL 69.0 mg/dL      Assessment & Plan:   Problem List Items Addressed This Visit    Essential hypertension    Mildly elevated today. No changes today.       Hairy cell leukemia, in remission ([place])    Appreciate onc care.       Systolic murmur    Chronic, stable.       Type 2 diabetes, controlled, with retinopathy ([place]) - Primary    Chronic, stable period. A1c well controlled for age at 7.0%. Pt unsure about SSI (2d after decadron PO), he is only checking sugars once daily. Advised to check twice daily to dose SSI accordingly with meals. We checked with pharmacist to ensure pt able to mix both humulin insulins together.  They say he can.           Follow up plan: Return in about 3 months (around 03/14/2017) for follow up visit.  Ria Bush, MD

## 2016-12-13 NOTE — Progress Notes (Signed)
Pre visit review using our clinic review tool, if applicable. No additional management support is needed unless otherwise documented below in the visit note. 

## 2016-12-13 NOTE — Assessment & Plan Note (Signed)
Mildly elevated today. No changes today.

## 2016-12-15 ENCOUNTER — Other Ambulatory Visit: Payer: Self-pay | Admitting: Hematology and Oncology

## 2016-12-17 ENCOUNTER — Inpatient Hospital Stay: Payer: Medicare Other | Attending: Hematology and Oncology

## 2016-12-17 ENCOUNTER — Inpatient Hospital Stay: Payer: Medicare Other

## 2016-12-17 ENCOUNTER — Other Ambulatory Visit: Payer: Self-pay | Admitting: Hematology and Oncology

## 2016-12-17 DIAGNOSIS — Z794 Long term (current) use of insulin: Secondary | ICD-10-CM | POA: Diagnosis not present

## 2016-12-17 DIAGNOSIS — Z85828 Personal history of other malignant neoplasm of skin: Secondary | ICD-10-CM | POA: Insufficient documentation

## 2016-12-17 DIAGNOSIS — K219 Gastro-esophageal reflux disease without esophagitis: Secondary | ICD-10-CM | POA: Insufficient documentation

## 2016-12-17 DIAGNOSIS — C9000 Multiple myeloma not having achieved remission: Secondary | ICD-10-CM | POA: Diagnosis not present

## 2016-12-17 DIAGNOSIS — D469 Myelodysplastic syndrome, unspecified: Secondary | ICD-10-CM | POA: Diagnosis not present

## 2016-12-17 DIAGNOSIS — Z86010 Personal history of colonic polyps: Secondary | ICD-10-CM | POA: Insufficient documentation

## 2016-12-17 DIAGNOSIS — Z7689 Persons encountering health services in other specified circumstances: Secondary | ICD-10-CM | POA: Diagnosis not present

## 2016-12-17 DIAGNOSIS — C9141 Hairy cell leukemia, in remission: Secondary | ICD-10-CM | POA: Diagnosis not present

## 2016-12-17 DIAGNOSIS — Z8619 Personal history of other infectious and parasitic diseases: Secondary | ICD-10-CM | POA: Insufficient documentation

## 2016-12-17 DIAGNOSIS — D649 Anemia, unspecified: Secondary | ICD-10-CM | POA: Insufficient documentation

## 2016-12-17 DIAGNOSIS — E785 Hyperlipidemia, unspecified: Secondary | ICD-10-CM | POA: Diagnosis not present

## 2016-12-17 DIAGNOSIS — E11319 Type 2 diabetes mellitus with unspecified diabetic retinopathy without macular edema: Secondary | ICD-10-CM | POA: Diagnosis not present

## 2016-12-17 DIAGNOSIS — N400 Enlarged prostate without lower urinary tract symptoms: Secondary | ICD-10-CM | POA: Insufficient documentation

## 2016-12-17 DIAGNOSIS — Z803 Family history of malignant neoplasm of breast: Secondary | ICD-10-CM | POA: Insufficient documentation

## 2016-12-17 DIAGNOSIS — Z7982 Long term (current) use of aspirin: Secondary | ICD-10-CM | POA: Insufficient documentation

## 2016-12-17 DIAGNOSIS — I10 Essential (primary) hypertension: Secondary | ICD-10-CM | POA: Insufficient documentation

## 2016-12-17 DIAGNOSIS — E538 Deficiency of other specified B group vitamins: Secondary | ICD-10-CM | POA: Insufficient documentation

## 2016-12-17 DIAGNOSIS — Z87891 Personal history of nicotine dependence: Secondary | ICD-10-CM | POA: Diagnosis not present

## 2016-12-17 DIAGNOSIS — Z87442 Personal history of urinary calculi: Secondary | ICD-10-CM | POA: Insufficient documentation

## 2016-12-17 DIAGNOSIS — D46Z Other myelodysplastic syndromes: Secondary | ICD-10-CM

## 2016-12-17 LAB — CBC WITH DIFFERENTIAL/PLATELET
Basophils Absolute: 0 10*3/uL (ref 0–0.1)
Basophils Relative: 1 %
Eosinophils Absolute: 0.2 10*3/uL (ref 0–0.7)
Eosinophils Relative: 7 %
HCT: 25.1 % — ABNORMAL LOW (ref 40.0–52.0)
Hemoglobin: 8.6 g/dL — ABNORMAL LOW (ref 13.0–18.0)
Lymphocytes Relative: 22 %
Lymphs Abs: 0.5 10*3/uL — ABNORMAL LOW (ref 1.0–3.6)
MCH: 36.4 pg — ABNORMAL HIGH (ref 26.0–34.0)
MCHC: 34.2 g/dL (ref 32.0–36.0)
MCV: 106.3 fL — ABNORMAL HIGH (ref 80.0–100.0)
Monocytes Absolute: 0.2 10*3/uL (ref 0.2–1.0)
Monocytes Relative: 8 %
Neutro Abs: 1.6 10*3/uL (ref 1.4–6.5)
Neutrophils Relative %: 62 %
Platelets: 76 10*3/uL — ABNORMAL LOW (ref 150–440)
RBC: 2.36 MIL/uL — ABNORMAL LOW (ref 4.40–5.90)
RDW: 19 % — ABNORMAL HIGH (ref 11.5–14.5)
WBC: 2.5 10*3/uL — ABNORMAL LOW (ref 3.8–10.6)

## 2016-12-22 NOTE — Progress Notes (Signed)
Alex Smith  [phone]) [phone] [phone]) [phone]  ID: Judeth Horn OB: 29-Nov-1934  MR#: [id number]  EHM#:[id number]  Patient Care Team: Ria Bush, MD as PCP - General (Family Medicine) Lequita Asal, MD as Referring Physician (Hematology and Oncology)   Chief Complaint: Alex Smith is a 82 y.o. male with a myelodysplastic marrow, smoldering myeloma, and a history of hairy cell leukemia.  HPI:  Patient returns to clinic today for further evaluation and continuation of Granix. He continues to tolerate Revlimid well without significant side effects. He does not complain of weakness and fatigue today. He has no neurologic complaints. He denies any recent fevers or illnesses. He has a fair appetite, but denies weight loss. He has no chest pain or shortness of breath. He denies any nausea, vomiting, constipation, or diarrhea. He has no urinary complaints. Patient offers no specific complaints today.    REVIEW OF SYSTEMS:   Review of Systems  Constitutional: Negative.  Negative for fever, malaise/fatigue and weight loss.  Respiratory: Negative.  Negative for shortness of breath.   Cardiovascular: Negative.  Negative for chest pain and leg swelling.  Gastrointestinal: Negative.  Negative for abdominal pain.  Genitourinary: Negative.   Musculoskeletal: Negative.   Skin: Negative.  Negative for rash.  Neurological: Negative.  Negative for sensory change and weakness.  Endo/Heme/Allergies: Does not bruise/bleed easily.  Psychiatric/Behavioral: Negative.  The patient is not nervous/anxious.     As per HPI. Otherwise, a complete review of systems is negative.  PAST MEDICAL HISTORY: Past Medical History:  Diagnosis Date  . Anemia   . B12 deficiency   . Basal cell carcinoma of face   . BPH (benign prostatic hypertrophy)    followed by urology, discharged (Dr. Bernardo Heater)  . CAP (community acquired pneumonia) 02/15/2015  . Colon polyps   .  Diverticulosis   . GERD (gastroesophageal reflux disease)   . Hairy cell leukemia ([place]) 2006   recurrent, seizure on rituxan, now on cladribine ([place])  . History of pneumonia 2000's   "once" (07/07/2012)  . History of shingles   . HLD (hyperlipidemia)   . Hypertension   . Pneumonia   . Shortness of breath dyspnea   . Systolic murmur 36/62/9476  . Type 2 diabetes, controlled, with retinopathy ([place])     PAST SURGICAL HISTORY: Past Surgical History:  Procedure Laterality Date  . [place] VITRECTOMY WITH 20 GAUGE MVR PORT FOR MACULAR HOLE  07/07/2012   Procedure: 25 GAUGE PARS PLANA VITRECTOMY WITH 20 GAUGE MVR PORT FOR MACULAR HOLE;  Surgeon: Hayden Pedro, MD;  Location: [facility];  Service: Ophthalmology;  Laterality: Left;  . BONE MARROW BIOPSY  2016  . CARDIOVASCULAR STRESS TEST  2013   treadmill - no evidence ischemia, EF 61%  . CATARACT EXTRACTION W/ INTRAOCULAR LENS  IMPLANT, BILATERAL  ~ 2010  . COLONOSCOPY  2014   [place] WNL no rpt needed, h/o polyps  . EYE SURGERY Left 06/2012   laser surgery  . GAS INSERTION  07/07/2012   Procedure: INSERTION OF GAS;  Surgeon: Hayden Pedro, MD;  Location: [facility];  Service: Ophthalmology;  Laterality: Left;  [id number]  . PERIPHERAL VASCULAR CATHETERIZATION N/A 02/23/2015   Procedure: Glori Luis Cath Insertion;  Surgeon: Algernon Huxley, MD;  Location: [facility];  Service: Cardiovascular;  Laterality: N/A;  . SERUM PATCH  07/07/2012   Procedure: SERUM PATCH;  Surgeon: Hayden Pedro, MD;  Location: [facility];  Service: Ophthalmology;  Laterality: Left;  . SKIN CANCER EXCISION     "all over my face" (07/07/2012)    FAMILY HISTORY: Family History  Problem Relation Age of Onset  . Dementia Mother   . Heart failure Father 80  . Cancer Sister        breast  . Diabetes Paternal Uncle   . Diabetes Paternal Aunt   . CAD Brother 8       MI  . Stroke Neg Hx     ADVANCED DIRECTIVES (Y/N):  N  HEALTH MAINTENANCE: Social  History  Substance Use Topics  . Smoking status: Former Smoker    Packs/day: 2.00    Years: 25.00    Types: Cigarettes    Quit date: 02/06/1969  . Smokeless tobacco: Never Used     Comment: 07/07/2012 "stopped smoking ~ 40 yr ago; smoked 20-35yr  . Alcohol use 0.0 oz/week     Comment: rare     Colonoscopy:  PAP:  Bone density:  Lipid panel:  Allergies  Allergen Reactions  . Rituximab Rash    Chest tightness  . Blood-Group Specific Substance Other (See Comments)    Had a post transfusion reaction of red blood cells; NOW REQUIRES WASHED BLOOD CELLS  . Primaxin [Imipenem] Other (See Comments)    Possible allergy  . Sulfa Antibiotics Itching and Rash    Current Outpatient Prescriptions  Medication Sig Dispense Refill  . acyclovir (ZOVIRAX) 400 MG tablet TAKE TWO TABLETS TWICE DAILY 120 tablet 6  . aspirin EC 81 MG tablet Take 81 mg by mouth daily.    .Marland Kitchendexamethasone (DECADRON) 4 MG tablet Take 5 tablets (20 mg total) by mouth once a week. 20 tablet 1  . glucose blood (ACCU-CHEK AVIVA PLUS) test strip Use to check sugar twice daily Dx:E11.9 200 each 3  . HUMULIN R 100 UNIT/ML injection INJECT 10 UNITS SUBCUTANEOUSLY TWICE DAILY BEFORE MEALS 10 mL 6  . hydrochlorothiazide (HYDRODIURIL) 25 MG tablet TAKE ONE TABLET EVERY DAY 30 tablet 5  . insulin NPH Human (HUMULIN N) 100 UNIT/ML injection Inject 0.39 mLs (39 Units total) into the skin 2 (two) times daily before a meal. 20 mL 6  . lidocaine-prilocaine (EMLA) cream Apply 1 application topically as needed. 30 g 3  . lisinopril (PRINIVIL,ZESTRIL) 20 MG tablet TAKE 1 TABLET BY MOUTH TWICE DAILY 60 tablet 6  . metFORMIN (GLUCOPHAGE) 1000 MG tablet TAKE ONE TABLET BY MOUTH TWICE DAILY AS DIRECTED 180 tablet 3  . MICROLET LANCETS MISC USE AS DIRECTED 200 each 3  . pravastatin (PRAVACHOL) 20 MG tablet TAKE ONE TABLET AT BEDTIME 30 tablet 6  . ranitidine (ZANTAC) 150 MG tablet TAKE 1 TABLET BY MOUTH TWICE DAILY AS NEEDED 180 tablet 3  .  tamsulosin (FLOMAX) 0.4 MG CAPS capsule Take 2 capsules (0.8 mg total) by mouth daily. 60 capsule 1  . temazepam (RESTORIL) 7.5 MG capsule Take 7.5 mg by mouth once.     . traZODone (DESYREL) 50 MG tablet Take 0.5-1 tablets (25-50 mg total) by mouth at bedtime as needed for sleep. 30 tablet 3  . vitamin E 400 UNIT capsule Take 200 Units by mouth daily.     .Marland Kitchenlenalidomide (REVLIMID) 5 MG capsule 5 mg a day for 21 days then 7 days off 21 capsule 0   Current Facility-Administered Medications  Medication Dose Route Frequency Provider Last Rate Last Dose  . Tbo-Filgrastim (GRANIX) injection 480 mcg  480 mcg Subcutaneous Once CLequita Asal MD  Facility-Administered Medications Ordered in Other Visits  Medication Dose Route Frequency Provider Last Rate Last Dose  . heparin lock flush 100 unit/mL  500 Units Intravenous Once [place], Melissa C, MD      . sodium chloride 0.9 % injection 10 mL  10 mL Intravenous PRN Lequita Asal, MD   10 mL at 03/03/15 0903  . sodium chloride 0.9 % injection 10 mL  10 mL Intracatheter PRN Nolon Stalls C, MD   10 mL at 03/10/15 1410    OBJECTIVE: Vitals:   12/24/16 1228  BP: (!) 167/71  Pulse: 91  Temp: 99 F (37.2 C)     Body mass index is 34.5 kg/m.    ECOG FS:1 - Symptomatic but completely ambulatory  General: Well-developed, well-nourished, no acute distress. Eyes: Pink conjunctiva, anicteric sclera. HEENT: Normocephalic, moist mucous membranes, clear oropharnyx. Lungs: Clear to auscultation bilaterally. Heart: Regular rate and rhythm. No rubs, murmurs, or gallops. Abdomen: Soft, nontender, nondistended. No organomegaly noted, normoactive bowel sounds. Musculoskeletal: No edema, cyanosis, or clubbing. Neuro: Alert, answering all questions appropriately. Cranial nerves grossly intact. Skin: No rashes or petechiae noted. Psych: Normal affect.   LAB RESULTS:  Lab Results  Component Value Date   NA 135 12/24/2016   K 3.7  12/24/2016   CL 104 12/24/2016   CO2 22 12/24/2016   GLUCOSE 267 (H) 12/24/2016   BUN 36 (H) 12/24/2016   CREATININE 1.62 (H) 12/24/2016   CALCIUM 8.7 (L) 12/24/2016   PROT 6.8 12/24/2016   ALBUMIN 3.4 (L) 12/24/2016   AST 22 12/24/2016   ALT 25 12/24/2016   ALKPHOS 58 12/24/2016   BILITOT 0.8 12/24/2016   GFRNONAA 38 (L) 12/24/2016   GFRAA 45 (L) 12/24/2016    Lab Results  Component Value Date   WBC 2.1 (L) 12/24/2016   NEUTROABS 1.1 (L) 12/24/2016   HGB 9.0 (L) 12/24/2016   HCT 25.8 (L) 12/24/2016   MCV 104.1 (H) 12/24/2016   PLT 82 (L) 12/24/2016     STUDIES: No results found.  Assessment:  Alex Smith is a 82 y.o. 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  He was diagnosed with hairy cell leukemia in 2006.  He received cladribine 11/06-11/05/2005.  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   Bone marrow aspirate and biopsy on 02/07/2015 revealed a extensive marrow involvement by recurrent hairy cell leukemia (approximately 80% of cells in the core). There was a monoclonal plasma cell infiltrate (approximately 10%), compatible with a plasma cell neoplasm. Marrow was hypercellular (40-50%) with markedly diminished residual trilineage hematopoiesis and mild multilineage dyspoiesis.  Peripheral smear revealed leukopenia (WBC 1900) with 2% atypical lymphoid cells compatible with hairy cell leukemia. There was moderate anemia with anisopoikilocytosis. 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  FISH studies for MDS were negative.  Cytogenetics were normal (46, XY).  He has an unclear allergy history.  He may be allergic to Primaxin, Zoloft, allopurinol, and a chemotherapy medication.  Reaction occurred in 06/2005.  He is allergic to sulfa.  He is not allergic to dapsone.  He requires washed RBCs.  He is on prophylactic acyclovir.  Dapsone and voriconazole were  discontinued.  He has a history of shingles prior to prophylactic acyclovir.  He is 17 months status post cladribine (began 03/03/2015).  His counts plateaued on 07/25/2015 and have subsequently decreased.  Labs on 07/25/2015 revealed a normal B12 (894), folate (60.5), and TSH (  2.562).  Ferritin was 50 on 04/26/2016.  Labs on 08/22/2016 revealed a normal B12, folate, and TSH.  He is on oral B12.  Bone marrow on 11/03/2015 revealed persistent plasma cell neoplasm, now with mildy atypical monoclonal plasma cells estimated at 20-30%.   There was no morphologic evidence of residual hairy cell leukemia.  There was a minute population (0.03%) with hairy cell immunophenotype detected by flow.  Marrow was normocellular for age with relative erythroid hyperplasia, relative myeloid hypoplasia, mild dyspoiesis, adequate megakaryocytes and no increased blasts.  There was no significant increase in marrow reticulin fibers.  Iron was present.  FISH studies for MDS were negative.  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  Cytogenetics were normal (46, XY).  Bone marrow on 03/13/2016 was normocellular to marginally hypercellular for age (20-40%) with relative erythroid hyperplasia and mild dyserythropoiesis, decreased granulopoiesis and adequate megakaryocytes.  There was a persistent 15-20% plasma cell neoplasm.  There was no overt increase in reticulin fibrosis.  Storage iron was present.  Flow cytometry revealed abnormal/monocytic plasma cell population (3% sample), consistent with a plasma cell neoplastic process.  Cytogenetics were normal (46, XY).  FISH studies for myeloma revealed a CCND1/IGH translocation, t(11;14) in 53% of nuclei.  SPEP has been followed:1.0 on 02/03/2015, 1.4 gm/dL on 07/25/2015, 1.3 gm/dL on 11/17/2015, 1.2 gm/dL on 11/10/2017and 1.0 gm/dLon 09/20/2016.   Kappa free lightchains have been followed: 47.63 (ratio 3.12) on 02/03/2015, 54.9  (ratio 3.14) on 02/07/2015, 46.23 (ratio 3.68) on 07/25/2015, and 50.5 (ratio 4.82) on 11/17/2015.  Beta2-microglobulinwas 2.5 on 11/17/2015. 24 hour urine on 11/21/2015 revealed 234.8 mg protein in 24 hours (no monoclonal protein). Bone surveyon 11/24/2015 revealed no lytic lesions. Epo levelwas 329.6 on 03/29/2016.  He began a trial of Procrit. He received 10,000 units of Procrit weekly (04/26/2016 - 06/14/2016). Hemoglobin ranged between 8.4 and 8.9 without trend.   He is s/p 1 cycle ofRevlimid (10 mg) and Decadron(06/29/2016 - 07/19/2016). Cycle #1 was complicated by anemia and neutropenia. He has received 3 units of PRBCs(1 unit:07/22/2016 and 2 units: 08/07/2016). He received 6 days ofGCSF (last 08/05/2016).  He is currently taking Revlimid (6m) and Decadron (09/14/2016 - 11/08/2016).  He began weekly GCSF on 11/05/2016.  He receives GCSF if his ANC is <= 1500.  He is on aspirin81 mg a day prophylaxis.  PET scan on 03/14/2016 revealed no evidence of hypermetabolic lymphadenopathy or focal skeletal lesions to suggest marrow or cortical bone involvement.   Plan: 1.  Labs today. 2.  Continue Revlimid 5 mg a day (3 weeks on and 1 week off). 3.  GCSF 480 mcg today. 4.  RTC weekly x 3 for labs (CBC with diff) +/- Neupogen, if ANC is less than 1500.  5.  RTC in 4 weeks for MD assessment, labs (CBC with diff, CMP, Mg) and +/- Neupogen.  Patient expressed understanding and was in agreement with this plan. He also understands that He can call clinic at any time with any questions, concerns, or complaints.    TLloyd Huger MD   12/30/2016 9:12 AM

## 2016-12-24 ENCOUNTER — Encounter: Payer: Self-pay | Admitting: Oncology

## 2016-12-24 ENCOUNTER — Inpatient Hospital Stay (HOSPITAL_BASED_OUTPATIENT_CLINIC_OR_DEPARTMENT_OTHER): Payer: Medicare Other | Admitting: Oncology

## 2016-12-24 ENCOUNTER — Inpatient Hospital Stay: Payer: Medicare Other

## 2016-12-24 VITALS — BP 167/71 | HR 91 | Temp 99.0°F | Wt 210.5 lb

## 2016-12-24 DIAGNOSIS — E11319 Type 2 diabetes mellitus with unspecified diabetic retinopathy without macular edema: Secondary | ICD-10-CM | POA: Diagnosis not present

## 2016-12-24 DIAGNOSIS — C9141 Hairy cell leukemia, in remission: Secondary | ICD-10-CM | POA: Diagnosis not present

## 2016-12-24 DIAGNOSIS — Z85828 Personal history of other malignant neoplasm of skin: Secondary | ICD-10-CM

## 2016-12-24 DIAGNOSIS — D469 Myelodysplastic syndrome, unspecified: Secondary | ICD-10-CM

## 2016-12-24 DIAGNOSIS — Z7982 Long term (current) use of aspirin: Secondary | ICD-10-CM | POA: Diagnosis not present

## 2016-12-24 DIAGNOSIS — E785 Hyperlipidemia, unspecified: Secondary | ICD-10-CM

## 2016-12-24 DIAGNOSIS — Z86010 Personal history of colonic polyps: Secondary | ICD-10-CM

## 2016-12-24 DIAGNOSIS — D46Z Other myelodysplastic syndromes: Secondary | ICD-10-CM

## 2016-12-24 DIAGNOSIS — Z8619 Personal history of other infectious and parasitic diseases: Secondary | ICD-10-CM

## 2016-12-24 DIAGNOSIS — E538 Deficiency of other specified B group vitamins: Secondary | ICD-10-CM

## 2016-12-24 DIAGNOSIS — Z794 Long term (current) use of insulin: Secondary | ICD-10-CM | POA: Diagnosis not present

## 2016-12-24 DIAGNOSIS — D649 Anemia, unspecified: Secondary | ICD-10-CM | POA: Diagnosis not present

## 2016-12-24 DIAGNOSIS — K219 Gastro-esophageal reflux disease without esophagitis: Secondary | ICD-10-CM | POA: Diagnosis not present

## 2016-12-24 DIAGNOSIS — Z87891 Personal history of nicotine dependence: Secondary | ICD-10-CM | POA: Diagnosis not present

## 2016-12-24 DIAGNOSIS — I10 Essential (primary) hypertension: Secondary | ICD-10-CM | POA: Diagnosis not present

## 2016-12-24 DIAGNOSIS — N400 Enlarged prostate without lower urinary tract symptoms: Secondary | ICD-10-CM

## 2016-12-24 DIAGNOSIS — Z87442 Personal history of urinary calculi: Secondary | ICD-10-CM | POA: Diagnosis not present

## 2016-12-24 DIAGNOSIS — Z7689 Persons encountering health services in other specified circumstances: Secondary | ICD-10-CM

## 2016-12-24 DIAGNOSIS — C9000 Multiple myeloma not having achieved remission: Secondary | ICD-10-CM | POA: Diagnosis not present

## 2016-12-24 DIAGNOSIS — Z803 Family history of malignant neoplasm of breast: Secondary | ICD-10-CM | POA: Diagnosis not present

## 2016-12-24 LAB — COMPREHENSIVE METABOLIC PANEL
ALT: 25 U/L (ref 17–63)
AST: 22 U/L (ref 15–41)
Albumin: 3.4 g/dL — ABNORMAL LOW (ref 3.5–5.0)
Alkaline Phosphatase: 58 U/L (ref 38–126)
Anion gap: 9 (ref 5–15)
BUN: 36 mg/dL — ABNORMAL HIGH (ref 6–20)
CO2: 22 mmol/L (ref 22–32)
Calcium: 8.7 mg/dL — ABNORMAL LOW (ref 8.9–10.3)
Chloride: 104 mmol/L (ref 101–111)
Creatinine, Ser: 1.62 mg/dL — ABNORMAL HIGH (ref 0.61–1.24)
GFR calc Af Amer: 45 mL/min — ABNORMAL LOW (ref >60)
GFR calc non Af Amer: 38 mL/min — ABNORMAL LOW (ref >60)
Glucose, Bld: 267 mg/dL — ABNORMAL HIGH (ref 65–99)
Potassium: 3.7 mmol/L (ref 3.5–5.1)
Sodium: 135 mmol/L (ref 135–145)
Total Bilirubin: 0.8 mg/dL (ref 0.3–1.2)
Total Protein: 6.8 g/dL (ref 6.5–8.1)

## 2016-12-24 LAB — CBC WITH DIFFERENTIAL/PLATELET
Basophils Absolute: 0 10*3/uL (ref 0–0.1)
Basophils Relative: 1 %
Eosinophils Absolute: 0.3 10*3/uL (ref 0–0.7)
Eosinophils Relative: 13 %
HCT: 25.8 % — ABNORMAL LOW (ref 40.0–52.0)
Hemoglobin: 9 g/dL — ABNORMAL LOW (ref 13.0–18.0)
Lymphocytes Relative: 29 %
Lymphs Abs: 0.6 10*3/uL — ABNORMAL LOW (ref 1.0–3.6)
MCH: 36.2 pg — ABNORMAL HIGH (ref 26.0–34.0)
MCHC: 34.7 g/dL (ref 32.0–36.0)
MCV: 104.1 fL — ABNORMAL HIGH (ref 80.0–100.0)
Monocytes Absolute: 0.1 10*3/uL — ABNORMAL LOW (ref 0.2–1.0)
Monocytes Relative: 5 %
Neutro Abs: 1.1 10*3/uL — ABNORMAL LOW (ref 1.4–6.5)
Neutrophils Relative %: 52 %
Platelets: 82 10*3/uL — ABNORMAL LOW (ref 150–440)
RBC: 2.48 MIL/uL — ABNORMAL LOW (ref 4.40–5.90)
RDW: 18.4 % — ABNORMAL HIGH (ref 11.5–14.5)
WBC: 2.1 10*3/uL — ABNORMAL LOW (ref 3.8–10.6)

## 2016-12-24 LAB — MAGNESIUM: Magnesium: 1.9 mg/dL (ref 1.7–2.4)

## 2016-12-24 MED ORDER — TBO-FILGRASTIM 480 MCG/0.8ML ~~LOC~~ SOSY
480.0000 ug | PREFILLED_SYRINGE | Freq: Once | SUBCUTANEOUS | Status: AC
  Administered 2016-12-24: 480 ug via SUBCUTANEOUS
  Filled 2016-12-24: qty 0.8

## 2016-12-24 MED ORDER — TBO-FILGRASTIM 480 MCG/0.8ML ~~LOC~~ SOSY
480.0000 ug | PREFILLED_SYRINGE | Freq: Once | SUBCUTANEOUS | Status: AC
  Administered 2016-12-24: 480 ug via SUBCUTANEOUS

## 2016-12-24 MED ORDER — FILGRASTIM 480 MCG/0.8ML IJ SOSY: 480.0000 ug | PREFILLED_SYRINGE | Freq: Once | INTRAMUSCULAR | Status: DC

## 2016-12-25 DIAGNOSIS — Z85828 Personal history of other malignant neoplasm of skin: Secondary | ICD-10-CM | POA: Diagnosis not present

## 2016-12-25 DIAGNOSIS — Z1283 Encounter for screening for malignant neoplasm of skin: Secondary | ICD-10-CM | POA: Diagnosis not present

## 2016-12-25 DIAGNOSIS — D485 Neoplasm of uncertain behavior of skin: Secondary | ICD-10-CM | POA: Diagnosis not present

## 2016-12-25 DIAGNOSIS — L570 Actinic keratosis: Secondary | ICD-10-CM | POA: Diagnosis not present

## 2016-12-25 DIAGNOSIS — L578 Other skin changes due to chronic exposure to nonionizing radiation: Secondary | ICD-10-CM | POA: Diagnosis not present

## 2016-12-25 DIAGNOSIS — C44311 Basal cell carcinoma of skin of nose: Secondary | ICD-10-CM | POA: Diagnosis not present

## 2016-12-26 ENCOUNTER — Other Ambulatory Visit: Payer: Self-pay | Admitting: *Deleted

## 2016-12-26 ENCOUNTER — Telehealth: Payer: Self-pay | Admitting: *Deleted

## 2016-12-26 DIAGNOSIS — C9141 Hairy cell leukemia, in remission: Secondary | ICD-10-CM

## 2016-12-26 DIAGNOSIS — C9000 Multiple myeloma not having achieved remission: Secondary | ICD-10-CM

## 2016-12-26 DIAGNOSIS — D46Z Other myelodysplastic syndromes: Secondary | ICD-10-CM

## 2016-12-26 MED ORDER — LENALIDOMIDE 5 MG PO CAPS: ORAL_CAPSULE | ORAL | 0 refills | Status: DC

## 2016-12-26 NOTE — Telephone Encounter (Signed)
Call patient and informed him that Dr [name] was out of the clinic, and that we would get the revlimid refilled when she returned to the clinic as the patient was suppose to be on break for a week starting 5-11, voiced understanding.

## 2016-12-31 ENCOUNTER — Inpatient Hospital Stay: Payer: Medicare Other

## 2017-01-06 ENCOUNTER — Other Ambulatory Visit: Payer: Self-pay | Admitting: Hematology and Oncology

## 2017-01-07 ENCOUNTER — Inpatient Hospital Stay: Payer: Medicare Other

## 2017-01-07 ENCOUNTER — Inpatient Hospital Stay: Payer: Medicare Other | Admitting: *Deleted

## 2017-01-07 DIAGNOSIS — Z7689 Persons encountering health services in other specified circumstances: Secondary | ICD-10-CM | POA: Diagnosis not present

## 2017-01-07 DIAGNOSIS — Z803 Family history of malignant neoplasm of breast: Secondary | ICD-10-CM | POA: Diagnosis not present

## 2017-01-07 DIAGNOSIS — D46Z Other myelodysplastic syndromes: Secondary | ICD-10-CM

## 2017-01-07 DIAGNOSIS — D649 Anemia, unspecified: Secondary | ICD-10-CM | POA: Diagnosis not present

## 2017-01-07 DIAGNOSIS — D469 Myelodysplastic syndrome, unspecified: Secondary | ICD-10-CM | POA: Diagnosis not present

## 2017-01-07 DIAGNOSIS — Z87891 Personal history of nicotine dependence: Secondary | ICD-10-CM | POA: Diagnosis not present

## 2017-01-07 DIAGNOSIS — Z794 Long term (current) use of insulin: Secondary | ICD-10-CM | POA: Diagnosis not present

## 2017-01-07 DIAGNOSIS — C9000 Multiple myeloma not having achieved remission: Secondary | ICD-10-CM | POA: Diagnosis not present

## 2017-01-07 DIAGNOSIS — Z86010 Personal history of colonic polyps: Secondary | ICD-10-CM | POA: Diagnosis not present

## 2017-01-07 DIAGNOSIS — K219 Gastro-esophageal reflux disease without esophagitis: Secondary | ICD-10-CM | POA: Diagnosis not present

## 2017-01-07 DIAGNOSIS — Z87442 Personal history of urinary calculi: Secondary | ICD-10-CM | POA: Diagnosis not present

## 2017-01-07 DIAGNOSIS — E538 Deficiency of other specified B group vitamins: Secondary | ICD-10-CM | POA: Diagnosis not present

## 2017-01-07 DIAGNOSIS — Z7982 Long term (current) use of aspirin: Secondary | ICD-10-CM | POA: Diagnosis not present

## 2017-01-07 DIAGNOSIS — C9141 Hairy cell leukemia, in remission: Secondary | ICD-10-CM | POA: Diagnosis not present

## 2017-01-07 DIAGNOSIS — I10 Essential (primary) hypertension: Secondary | ICD-10-CM | POA: Diagnosis not present

## 2017-01-07 DIAGNOSIS — E11319 Type 2 diabetes mellitus with unspecified diabetic retinopathy without macular edema: Secondary | ICD-10-CM | POA: Diagnosis not present

## 2017-01-07 DIAGNOSIS — N400 Enlarged prostate without lower urinary tract symptoms: Secondary | ICD-10-CM | POA: Diagnosis not present

## 2017-01-07 DIAGNOSIS — Z8619 Personal history of other infectious and parasitic diseases: Secondary | ICD-10-CM | POA: Diagnosis not present

## 2017-01-07 DIAGNOSIS — E785 Hyperlipidemia, unspecified: Secondary | ICD-10-CM | POA: Diagnosis not present

## 2017-01-07 DIAGNOSIS — Z85828 Personal history of other malignant neoplasm of skin: Secondary | ICD-10-CM | POA: Diagnosis not present

## 2017-01-07 LAB — CBC WITH DIFFERENTIAL/PLATELET
Basophils Absolute: 0 10*3/uL (ref 0–0.1)
Basophils Relative: 1 %
Eosinophils Absolute: 0.1 10*3/uL (ref 0–0.7)
Eosinophils Relative: 6 %
HCT: 26.2 % — ABNORMAL LOW (ref 40.0–52.0)
Hemoglobin: 9 g/dL — ABNORMAL LOW (ref 13.0–18.0)
Lymphocytes Relative: 25 %
Lymphs Abs: 0.5 10*3/uL — ABNORMAL LOW (ref 1.0–3.6)
MCH: 36.2 pg — ABNORMAL HIGH (ref 26.0–34.0)
MCHC: 34.5 g/dL (ref 32.0–36.0)
MCV: 105.1 fL — ABNORMAL HIGH (ref 80.0–100.0)
Monocytes Absolute: 0.1 10*3/uL — ABNORMAL LOW (ref 0.2–1.0)
Monocytes Relative: 4 %
Neutro Abs: 1.2 10*3/uL — ABNORMAL LOW (ref 1.4–6.5)
Neutrophils Relative %: 64 %
Platelets: 70 10*3/uL — ABNORMAL LOW (ref 150–440)
RBC: 2.5 MIL/uL — ABNORMAL LOW (ref 4.40–5.90)
RDW: 19.7 % — ABNORMAL HIGH (ref 11.5–14.5)
WBC: 1.9 10*3/uL — ABNORMAL LOW (ref 3.8–10.6)

## 2017-01-07 MED ORDER — FILGRASTIM 480 MCG/0.8ML IJ SOSY: 480.0000 ug | PREFILLED_SYRINGE | Freq: Once | INTRAMUSCULAR | Status: DC

## 2017-01-07 MED ORDER — TBO-FILGRASTIM 480 MCG/0.8ML ~~LOC~~ SOSY
480.0000 ug | PREFILLED_SYRINGE | Freq: Once | SUBCUTANEOUS | Status: AC
  Administered 2017-01-07: 480 ug via SUBCUTANEOUS
  Filled 2017-01-07: qty 0.8

## 2017-01-14 ENCOUNTER — Other Ambulatory Visit: Payer: Self-pay | Admitting: *Deleted

## 2017-01-14 ENCOUNTER — Inpatient Hospital Stay: Payer: Medicare Other | Admitting: *Deleted

## 2017-01-14 ENCOUNTER — Other Ambulatory Visit: Payer: Self-pay | Admitting: Hematology and Oncology

## 2017-01-14 ENCOUNTER — Inpatient Hospital Stay: Payer: Medicare Other

## 2017-01-14 DIAGNOSIS — Z803 Family history of malignant neoplasm of breast: Secondary | ICD-10-CM | POA: Diagnosis not present

## 2017-01-14 DIAGNOSIS — I10 Essential (primary) hypertension: Secondary | ICD-10-CM | POA: Diagnosis not present

## 2017-01-14 DIAGNOSIS — C9000 Multiple myeloma not having achieved remission: Secondary | ICD-10-CM

## 2017-01-14 DIAGNOSIS — E785 Hyperlipidemia, unspecified: Secondary | ICD-10-CM | POA: Diagnosis not present

## 2017-01-14 DIAGNOSIS — D469 Myelodysplastic syndrome, unspecified: Secondary | ICD-10-CM | POA: Diagnosis not present

## 2017-01-14 DIAGNOSIS — Z87442 Personal history of urinary calculi: Secondary | ICD-10-CM | POA: Diagnosis not present

## 2017-01-14 DIAGNOSIS — K219 Gastro-esophageal reflux disease without esophagitis: Secondary | ICD-10-CM | POA: Diagnosis not present

## 2017-01-14 DIAGNOSIS — Z8619 Personal history of other infectious and parasitic diseases: Secondary | ICD-10-CM | POA: Diagnosis not present

## 2017-01-14 DIAGNOSIS — Z85828 Personal history of other malignant neoplasm of skin: Secondary | ICD-10-CM | POA: Diagnosis not present

## 2017-01-14 DIAGNOSIS — Z86010 Personal history of colonic polyps: Secondary | ICD-10-CM | POA: Diagnosis not present

## 2017-01-14 DIAGNOSIS — E538 Deficiency of other specified B group vitamins: Secondary | ICD-10-CM | POA: Diagnosis not present

## 2017-01-14 DIAGNOSIS — Z7982 Long term (current) use of aspirin: Secondary | ICD-10-CM | POA: Diagnosis not present

## 2017-01-14 DIAGNOSIS — Z87891 Personal history of nicotine dependence: Secondary | ICD-10-CM | POA: Diagnosis not present

## 2017-01-14 DIAGNOSIS — Z7689 Persons encountering health services in other specified circumstances: Secondary | ICD-10-CM | POA: Diagnosis not present

## 2017-01-14 DIAGNOSIS — Z95828 Presence of other vascular implants and grafts: Secondary | ICD-10-CM

## 2017-01-14 DIAGNOSIS — N400 Enlarged prostate without lower urinary tract symptoms: Secondary | ICD-10-CM | POA: Diagnosis not present

## 2017-01-14 DIAGNOSIS — E11319 Type 2 diabetes mellitus with unspecified diabetic retinopathy without macular edema: Secondary | ICD-10-CM | POA: Diagnosis not present

## 2017-01-14 DIAGNOSIS — C9141 Hairy cell leukemia, in remission: Secondary | ICD-10-CM | POA: Diagnosis not present

## 2017-01-14 DIAGNOSIS — D4620 Refractory anemia with excess of blasts, unspecified: Secondary | ICD-10-CM

## 2017-01-14 DIAGNOSIS — D649 Anemia, unspecified: Secondary | ICD-10-CM | POA: Diagnosis not present

## 2017-01-14 DIAGNOSIS — D46Z Other myelodysplastic syndromes: Secondary | ICD-10-CM

## 2017-01-14 DIAGNOSIS — Z794 Long term (current) use of insulin: Secondary | ICD-10-CM | POA: Diagnosis not present

## 2017-01-14 LAB — CBC WITH DIFFERENTIAL/PLATELET
Basophils Absolute: 0 10*3/uL (ref 0–0.1)
Basophils Relative: 1 %
Eosinophils Absolute: 0.2 10*3/uL (ref 0–0.7)
Eosinophils Relative: 8 %
HCT: 22.9 % — ABNORMAL LOW (ref 40.0–52.0)
Hemoglobin: 7.9 g/dL — ABNORMAL LOW (ref 13.0–18.0)
Lymphocytes Relative: 23 %
Lymphs Abs: 0.5 10*3/uL — ABNORMAL LOW (ref 1.0–3.6)
MCH: 36.4 pg — ABNORMAL HIGH (ref 26.0–34.0)
MCHC: 34.6 g/dL (ref 32.0–36.0)
MCV: 105.4 fL — ABNORMAL HIGH (ref 80.0–100.0)
Monocytes Absolute: 0.1 10*3/uL — ABNORMAL LOW (ref 0.2–1.0)
Monocytes Relative: 6 %
Neutro Abs: 1.3 10*3/uL — ABNORMAL LOW (ref 1.4–6.5)
Neutrophils Relative %: 62 %
Platelets: 46 10*3/uL — ABNORMAL LOW (ref 150–440)
RBC: 2.17 MIL/uL — ABNORMAL LOW (ref 4.40–5.90)
RDW: 19.3 % — ABNORMAL HIGH (ref 11.5–14.5)
WBC: 2 10*3/uL — ABNORMAL LOW (ref 3.8–10.6)

## 2017-01-14 MED ORDER — TBO-FILGRASTIM 480 MCG/0.8ML ~~LOC~~ SOSY
480.0000 ug | PREFILLED_SYRINGE | Freq: Once | SUBCUTANEOUS | Status: AC
  Administered 2017-01-14: 480 ug via SUBCUTANEOUS
  Filled 2017-01-14: qty 0.8

## 2017-01-14 MED ORDER — SODIUM CHLORIDE 0.9% FLUSH
10.0000 mL | INTRAVENOUS | Status: DC | PRN
  Administered 2017-01-14: 10 mL via INTRAVENOUS
  Filled 2017-01-14: qty 10

## 2017-01-14 MED ORDER — FILGRASTIM 480 MCG/0.8ML IJ SOSY: 480.0000 ug | PREFILLED_SYRINGE | Freq: Once | INTRAMUSCULAR | Status: DC

## 2017-01-14 MED ORDER — HEPARIN SOD (PORK) LOCK FLUSH 100 UNIT/ML IV SOLN
500.0000 [IU] | Freq: Once | INTRAVENOUS | Status: AC
  Administered 2017-01-14: 500 [IU] via INTRAVENOUS

## 2017-01-14 NOTE — Progress Notes (Signed)
Dr. [name] informed hemoglobin today is 7.9 and patient does have dyspnea on exertion with an increase in baseline fatigue.  Patient will return tomorrow at 11:30 for blood transfusion.

## 2017-01-15 ENCOUNTER — Inpatient Hospital Stay: Payer: Medicare Other

## 2017-01-15 DIAGNOSIS — D4620 Refractory anemia with excess of blasts, unspecified: Secondary | ICD-10-CM

## 2017-01-15 DIAGNOSIS — E785 Hyperlipidemia, unspecified: Secondary | ICD-10-CM | POA: Diagnosis not present

## 2017-01-15 DIAGNOSIS — Z803 Family history of malignant neoplasm of breast: Secondary | ICD-10-CM | POA: Diagnosis not present

## 2017-01-15 DIAGNOSIS — D649 Anemia, unspecified: Secondary | ICD-10-CM | POA: Diagnosis not present

## 2017-01-15 DIAGNOSIS — Z7689 Persons encountering health services in other specified circumstances: Secondary | ICD-10-CM | POA: Diagnosis not present

## 2017-01-15 DIAGNOSIS — Z8619 Personal history of other infectious and parasitic diseases: Secondary | ICD-10-CM | POA: Diagnosis not present

## 2017-01-15 DIAGNOSIS — Z86010 Personal history of colonic polyps: Secondary | ICD-10-CM | POA: Diagnosis not present

## 2017-01-15 DIAGNOSIS — Z87442 Personal history of urinary calculi: Secondary | ICD-10-CM | POA: Diagnosis not present

## 2017-01-15 DIAGNOSIS — C9000 Multiple myeloma not having achieved remission: Secondary | ICD-10-CM | POA: Diagnosis not present

## 2017-01-15 DIAGNOSIS — Z7982 Long term (current) use of aspirin: Secondary | ICD-10-CM | POA: Diagnosis not present

## 2017-01-15 DIAGNOSIS — I10 Essential (primary) hypertension: Secondary | ICD-10-CM | POA: Diagnosis not present

## 2017-01-15 DIAGNOSIS — N400 Enlarged prostate without lower urinary tract symptoms: Secondary | ICD-10-CM | POA: Diagnosis not present

## 2017-01-15 DIAGNOSIS — C9141 Hairy cell leukemia, in remission: Secondary | ICD-10-CM | POA: Diagnosis not present

## 2017-01-15 DIAGNOSIS — Z794 Long term (current) use of insulin: Secondary | ICD-10-CM | POA: Diagnosis not present

## 2017-01-15 DIAGNOSIS — Z85828 Personal history of other malignant neoplasm of skin: Secondary | ICD-10-CM | POA: Diagnosis not present

## 2017-01-15 DIAGNOSIS — E538 Deficiency of other specified B group vitamins: Secondary | ICD-10-CM | POA: Diagnosis not present

## 2017-01-15 DIAGNOSIS — E11319 Type 2 diabetes mellitus with unspecified diabetic retinopathy without macular edema: Secondary | ICD-10-CM | POA: Diagnosis not present

## 2017-01-15 DIAGNOSIS — Z87891 Personal history of nicotine dependence: Secondary | ICD-10-CM | POA: Diagnosis not present

## 2017-01-15 DIAGNOSIS — K219 Gastro-esophageal reflux disease without esophagitis: Secondary | ICD-10-CM | POA: Diagnosis not present

## 2017-01-15 DIAGNOSIS — D469 Myelodysplastic syndrome, unspecified: Secondary | ICD-10-CM | POA: Diagnosis not present

## 2017-01-15 LAB — PREPARE RBC (CROSSMATCH)

## 2017-01-15 MED ORDER — SODIUM CHLORIDE 0.9% FLUSH
10.0000 mL | INTRAVENOUS | Status: DC | PRN
  Filled 2017-01-15: qty 10

## 2017-01-15 MED ORDER — HEPARIN SOD (PORK) LOCK FLUSH 100 UNIT/ML IV SOLN
500.0000 [IU] | Freq: Every day | INTRAVENOUS | Status: AC | PRN
  Administered 2017-01-15: 500 [IU]
  Filled 2017-01-15: qty 5

## 2017-01-15 MED ORDER — DIPHENHYDRAMINE HCL 25 MG PO CAPS
25.0000 mg | ORAL_CAPSULE | Freq: Once | ORAL | Status: AC
  Administered 2017-01-15: 25 mg via ORAL
  Filled 2017-01-15: qty 1

## 2017-01-15 MED ORDER — SODIUM CHLORIDE 0.9 % IV SOLN
250.0000 mL | Freq: Once | INTRAVENOUS | Status: AC
Start: 2017-01-15 — End: 2017-01-15
  Administered 2017-01-15: 250 mL via INTRAVENOUS
  Filled 2017-01-15: qty 250

## 2017-01-15 MED ORDER — ACETAMINOPHEN 325 MG PO TABS
650.0000 mg | ORAL_TABLET | Freq: Once | ORAL | Status: AC
  Administered 2017-01-15: 650 mg via ORAL
  Filled 2017-01-15: qty 2

## 2017-01-16 LAB — TYPE AND SCREEN
ABO/RH(D): A NEG
Antibody Screen: NEGATIVE
Unit division: 0

## 2017-01-16 LAB — BPAM RBC
Blood Product Expiration Date: 201805310639
ISSUE DATE / TIME: 201805301144
Unit Type and Rh: 600

## 2017-01-20 ENCOUNTER — Other Ambulatory Visit: Payer: Self-pay | Admitting: *Deleted

## 2017-01-20 DIAGNOSIS — D469 Myelodysplastic syndrome, unspecified: Secondary | ICD-10-CM

## 2017-01-21 ENCOUNTER — Inpatient Hospital Stay: Payer: Medicare Other

## 2017-01-21 ENCOUNTER — Inpatient Hospital Stay: Payer: Medicare Other | Attending: Hematology and Oncology | Admitting: Hematology and Oncology

## 2017-01-21 VITALS — BP 155/69 | HR 80 | Temp 99.4°F | Resp 18 | Wt 214.4 lb

## 2017-01-21 DIAGNOSIS — I10 Essential (primary) hypertension: Secondary | ICD-10-CM

## 2017-01-21 DIAGNOSIS — R5383 Other fatigue: Secondary | ICD-10-CM | POA: Diagnosis not present

## 2017-01-21 DIAGNOSIS — Z8619 Personal history of other infectious and parasitic diseases: Secondary | ICD-10-CM

## 2017-01-21 DIAGNOSIS — Z803 Family history of malignant neoplasm of breast: Secondary | ICD-10-CM | POA: Diagnosis not present

## 2017-01-21 DIAGNOSIS — D649 Anemia, unspecified: Secondary | ICD-10-CM | POA: Diagnosis not present

## 2017-01-21 DIAGNOSIS — Z85828 Personal history of other malignant neoplasm of skin: Secondary | ICD-10-CM | POA: Diagnosis not present

## 2017-01-21 DIAGNOSIS — C9140 Hairy cell leukemia not having achieved remission: Secondary | ICD-10-CM

## 2017-01-21 DIAGNOSIS — R011 Cardiac murmur, unspecified: Secondary | ICD-10-CM | POA: Diagnosis not present

## 2017-01-21 DIAGNOSIS — K219 Gastro-esophageal reflux disease without esophagitis: Secondary | ICD-10-CM | POA: Diagnosis not present

## 2017-01-21 DIAGNOSIS — Z7982 Long term (current) use of aspirin: Secondary | ICD-10-CM | POA: Diagnosis not present

## 2017-01-21 DIAGNOSIS — E119 Type 2 diabetes mellitus without complications: Secondary | ICD-10-CM | POA: Diagnosis not present

## 2017-01-21 DIAGNOSIS — D4620 Refractory anemia with excess of blasts, unspecified: Secondary | ICD-10-CM | POA: Diagnosis not present

## 2017-01-21 DIAGNOSIS — C9141 Hairy cell leukemia, in remission: Secondary | ICD-10-CM

## 2017-01-21 DIAGNOSIS — D46Z Other myelodysplastic syndromes: Secondary | ICD-10-CM

## 2017-01-21 DIAGNOSIS — E785 Hyperlipidemia, unspecified: Secondary | ICD-10-CM

## 2017-01-21 DIAGNOSIS — E538 Deficiency of other specified B group vitamins: Secondary | ICD-10-CM | POA: Diagnosis not present

## 2017-01-21 DIAGNOSIS — N400 Enlarged prostate without lower urinary tract symptoms: Secondary | ICD-10-CM

## 2017-01-21 DIAGNOSIS — Z79899 Other long term (current) drug therapy: Secondary | ICD-10-CM

## 2017-01-21 DIAGNOSIS — C9000 Multiple myeloma not having achieved remission: Secondary | ICD-10-CM

## 2017-01-21 DIAGNOSIS — Z7984 Long term (current) use of oral hypoglycemic drugs: Secondary | ICD-10-CM

## 2017-01-21 DIAGNOSIS — Z86010 Personal history of colonic polyps: Secondary | ICD-10-CM | POA: Diagnosis not present

## 2017-01-21 DIAGNOSIS — D469 Myelodysplastic syndrome, unspecified: Secondary | ICD-10-CM

## 2017-01-21 LAB — COMPREHENSIVE METABOLIC PANEL
ALT: 23 U/L (ref 17–63)
AST: 23 U/L (ref 15–41)
Albumin: 3.7 g/dL (ref 3.5–5.0)
Alkaline Phosphatase: 64 U/L (ref 38–126)
Anion gap: 8 (ref 5–15)
BUN: 30 mg/dL — ABNORMAL HIGH (ref 6–20)
CO2: 23 mmol/L (ref 22–32)
Calcium: 9.2 mg/dL (ref 8.9–10.3)
Chloride: 105 mmol/L (ref 101–111)
Creatinine, Ser: 1.4 mg/dL — ABNORMAL HIGH (ref 0.61–1.24)
GFR calc Af Amer: 53 mL/min — ABNORMAL LOW (ref >60)
GFR calc non Af Amer: 46 mL/min — ABNORMAL LOW (ref >60)
Glucose, Bld: 293 mg/dL — ABNORMAL HIGH (ref 65–99)
Potassium: 4 mmol/L (ref 3.5–5.1)
Sodium: 136 mmol/L (ref 135–145)
Total Bilirubin: 1 mg/dL (ref 0.3–1.2)
Total Protein: 6.7 g/dL (ref 6.5–8.1)

## 2017-01-21 LAB — CBC WITH DIFFERENTIAL/PLATELET
Basophils Absolute: 0 10*3/uL (ref 0–0.1)
Basophils Relative: 1 %
Eosinophils Absolute: 0.3 10*3/uL (ref 0–0.7)
Eosinophils Relative: 12 %
HCT: 29.4 % — ABNORMAL LOW (ref 40.0–52.0)
Hemoglobin: 10.2 g/dL — ABNORMAL LOW (ref 13.0–18.0)
Lymphocytes Relative: 37 %
Lymphs Abs: 0.8 10*3/uL — ABNORMAL LOW (ref 1.0–3.6)
MCH: 36 pg — ABNORMAL HIGH (ref 26.0–34.0)
MCHC: 34.7 g/dL (ref 32.0–36.0)
MCV: 103.6 fL — ABNORMAL HIGH (ref 80.0–100.0)
Monocytes Absolute: 0.1 10*3/uL — ABNORMAL LOW (ref 0.2–1.0)
Monocytes Relative: 3 %
Neutro Abs: 0.9 10*3/uL — ABNORMAL LOW (ref 1.4–6.5)
Neutrophils Relative %: 47 %
Platelets: 64 10*3/uL — ABNORMAL LOW (ref 150–440)
RBC: 2.84 MIL/uL — ABNORMAL LOW (ref 4.40–5.90)
RDW: 19.4 % — ABNORMAL HIGH (ref 11.5–14.5)
WBC: 2.1 10*3/uL — ABNORMAL LOW (ref 3.8–10.6)

## 2017-01-21 LAB — MAGNESIUM: Magnesium: 1.9 mg/dL (ref 1.7–2.4)

## 2017-01-21 MED ORDER — TBO-FILGRASTIM 480 MCG/0.8ML ~~LOC~~ SOSY
480.0000 ug | PREFILLED_SYRINGE | Freq: Once | SUBCUTANEOUS | Status: AC
  Administered 2017-01-21: 480 ug via SUBCUTANEOUS
  Filled 2017-01-21: qty 0.8

## 2017-01-21 MED ORDER — FILGRASTIM 480 MCG/0.8ML IJ SOSY: 480.0000 ug | PREFILLED_SYRINGE | Freq: Once | INTRAMUSCULAR | Status: DC

## 2017-01-21 NOTE — Progress Notes (Signed)
[facility] day:  01/21/2017   Chief Complaint: JOANDY BURGET is a 82 y.o. male with a myelodysplastic marrow, smoldering myeloma, and a history of hairy cell leukemia who is seen for assessment on day 19 of cycle #3 Revlimid.  HPI:  The patient was last seen by me in the medical oncology clinic by me on 11/26/2016.  At that time, he was a little fatigued.  Exam wasstable.  Counts include a hematocrit 24.1, hemoglobin of 8.5, platelets 85,000 and ANC 1500.  He continued Revlimid 5 mg a day (3 weeks on and 1 week off).  He continued GCSF 480 mcg a week if his ANC was < 1500.  During the interim, he has done well. He has had no infections. Level remains low. He has to Revlimid (the cycle that he is off a week. He received 1 unit of PRBCs on 01/14/2017 for hematocrit 22.9 and hemoglobin of 7.9.   Past Medical History:  Diagnosis Date  . Anemia   . B12 deficiency   . Basal cell carcinoma of face   . BPH (benign prostatic hypertrophy)    followed by urology, discharged (Dr. Bernardo Heater)  . CAP (community acquired pneumonia) 02/15/2015  . Colon polyps   . Diverticulosis   . GERD (gastroesophageal reflux disease)   . Hairy cell leukemia ([place]) 2006   recurrent, seizure on rituxan, now on cladribine ([place])  . History of pneumonia 2000's   "once" (07/07/2012)  . History of shingles   . HLD (hyperlipidemia)   . Hypertension   . Pneumonia   . Shortness of breath dyspnea   . Systolic murmur 46/80/3212  . Type 2 diabetes, controlled, with retinopathy ([place])     Past Surgical History:  Procedure Laterality Date  . [place] VITRECTOMY WITH 20 GAUGE MVR PORT FOR MACULAR HOLE  07/07/2012   Procedure: 25 GAUGE PARS PLANA VITRECTOMY WITH 20 GAUGE MVR PORT FOR MACULAR HOLE;  Surgeon: Hayden Pedro, MD;  Location: [facility];  Service: Ophthalmology;  Laterality: Left;  . BONE MARROW BIOPSY  2016  . CARDIOVASCULAR STRESS TEST  2013   treadmill - no  evidence ischemia, EF 61%  . CATARACT EXTRACTION W/ INTRAOCULAR LENS  IMPLANT, BILATERAL  ~ 2010  . COLONOSCOPY  2014   [place] WNL no rpt needed, h/o polyps  . EYE SURGERY Left 06/2012   laser surgery  . GAS INSERTION  07/07/2012   Procedure: INSERTION OF GAS;  Surgeon: Hayden Pedro, MD;  Location: [facility];  Service: Ophthalmology;  Laterality: Left;  [id number]  . PERIPHERAL VASCULAR CATHETERIZATION N/A 02/23/2015   Procedure: Glori Luis Cath Insertion;  Surgeon: Algernon Huxley, MD;  Location: [facility];  Service: Cardiovascular;  Laterality: N/A;  . SERUM PATCH  07/07/2012   Procedure: SERUM PATCH;  Surgeon: Hayden Pedro, MD;  Location: [facility];  Service: Ophthalmology;  Laterality: Left;  . SKIN CANCER EXCISION     "all over my face" (07/07/2012)    Family History  Problem Relation Age of Onset  . Dementia Mother   . Heart failure Father 60  . Cancer Sister        breast  . Diabetes Paternal Uncle   . Diabetes Paternal Aunt   . CAD Brother 24       MI  . Stroke Neg Hx     Social History:  reports that he quit smoking about 47 years ago. His smoking  use included Cigarettes. He has a 50.00 pack-year smoking history. He has never used smokeless tobacco. He reports that he drinks alcohol. He reports that he does not use drugs.  He works at [employer] 20-32 hours/week.  The patient 's wife has had 3 hip operations.  She underwent hip replacement 03/06/2016.  She recently hurt her shoulder.  He takes care of his wife.  He states that they either eat out or bring food in.  His best contact number is his cell: [phone].  He is alone today.  Allergies:  Allergies  Allergen Reactions  . Rituximab Rash    Chest tightness  . Blood-Group Specific Substance Other (See Comments)    Had a post transfusion reaction of red blood cells; NOW REQUIRES WASHED BLOOD CELLS  . Primaxin [Imipenem] Other (See Comments)    Possible allergy  . Sulfa Antibiotics Itching and Rash  Possible allergy to  Zoloft, allopurinol, and a chemotherapy medication.  He tolerates Dapsone.  Current Medications: Current Outpatient Prescriptions  Medication Sig Dispense Refill  . acyclovir (ZOVIRAX) 400 MG tablet TAKE TWO TABLETS TWICE DAILY 120 tablet 6  . aspirin EC 81 MG tablet Take 81 mg by mouth daily.    Marland Kitchen dexamethasone (DECADRON) 4 MG tablet TAKE 5 TABLETS ONCE A WEEK 20 tablet 2  . glucose blood (ACCU-CHEK AVIVA PLUS) test strip Use to check sugar twice daily Dx:E11.9 200 each 3  . HUMULIN R 100 UNIT/ML injection INJECT 10 UNITS SUBCUTANEOUSLY TWICE DAILY BEFORE MEALS 10 mL 6  . hydrochlorothiazide (HYDRODIURIL) 25 MG tablet TAKE ONE TABLET EVERY DAY 30 tablet 5  . insulin NPH Human (HUMULIN N) 100 UNIT/ML injection Inject 0.39 mLs (39 Units total) into the skin 2 (two) times daily before a meal. 20 mL 6  . lenalidomide (REVLIMID) 5 MG capsule 5 mg a day for 21 days then 7 days off 21 capsule 0  . lidocaine-prilocaine (EMLA) cream Apply 1 application topically as needed. 30 g 3  . lisinopril (PRINIVIL,ZESTRIL) 20 MG tablet TAKE 1 TABLET BY MOUTH TWICE DAILY 60 tablet 6  . metFORMIN (GLUCOPHAGE) 1000 MG tablet TAKE ONE TABLET BY MOUTH TWICE DAILY AS DIRECTED 180 tablet 3  . MICROLET LANCETS MISC USE AS DIRECTED 200 each 3  . pravastatin (PRAVACHOL) 20 MG tablet TAKE ONE TABLET AT BEDTIME 30 tablet 6  . ranitidine (ZANTAC) 150 MG tablet TAKE 1 TABLET BY MOUTH TWICE DAILY AS NEEDED 180 tablet 3  . tamsulosin (FLOMAX) 0.4 MG CAPS capsule Take 2 capsules (0.8 mg total) by mouth daily. 60 capsule 1  . temazepam (RESTORIL) 7.5 MG capsule Take 7.5 mg by mouth once.     . traZODone (DESYREL) 50 MG tablet Take 0.5-1 tablets (25-50 mg total) by mouth at bedtime as needed for sleep. 30 tablet 3  . vitamin E 400 UNIT capsule Take 200 Units by mouth daily.      Current Facility-Administered Medications  Medication Dose Route Frequency Provider Last Rate Last Dose  . Tbo-Filgrastim (GRANIX) injection 480 mcg   480 mcg Subcutaneous Once Lequita Asal, MD       Facility-Administered Medications Ordered in Other Visits  Medication Dose Route Frequency Provider Last Rate Last Dose  . heparin lock flush 100 unit/mL  500 Units Intravenous Once [place], Melissa C, MD      . sodium chloride 0.9 % injection 10 mL  10 mL Intravenous PRN Lequita Asal, MD   10 mL at 03/03/15 0903  . sodium  chloride 0.9 % injection 10 mL  10 mL Intracatheter PRN Nolon Stalls C, MD   10 mL at 03/10/15 1410  . sodium chloride flush (NS) 0.9 % injection 10 mL  10 mL Intravenous PRN Lequita Asal, MD   10 mL at 01/14/17 1139   Review of Systems:  GENERAL:  Feels "fatigued".  No increase in energy.  No fevers or sweats.  Weight up 4 pounds. PERFORMANCE STATUS (ECOG):  1 HEENT: No visual changes, runny nose, sore throat. Pulmonary:  Chronic shortness of breath on exertion.  No cough.  No hemoptysis. Cardiac:  No chest pain, palpitations, orthopnea, or PND. GI:  Appetite good.  No nausea, vomiting, diarrhea, constipation, melena or hematochezia.  GU:  No urgency, frequency, dysuria, or hematuria.  On Flomax. Musculoskeletal:  No back pain.  No joint pain.  No muscle tenderness. Extremities:  No pain or swelling. Skin:  No rashes or skin changes. Neuro:  No headache, numbness or weakness, balance or coordination issues. Endocrine:  Diabetes.  No thyroid issues, hot flashes or night sweats. Psych:  No mood changes, depression or anxiety.  Pain:  No focal pain. Review of systems:  All other systems reviewed and found to be negative.   Physical Exam: Blood pressure (!) 155/69, pulse 80, temperature 99.4 F (37.4 C), temperature source Tympanic, resp. rate 18, weight 214 lb 7 oz (97.3 kg).  GENERAL:  Well developed, well nourished, gentleman sitting comfortably in the exam room in no acute distress. MENTAL STATUS:  Alert and oriented to person, place and time. HEAD:  Pearline Cables white hair.  Normocephalic,  atraumatic, face symmetric, no Cushingoid features. EYES:  Glasses. Blue eyes.  Pupils equal round and reactive to light and accomodation.  No conjunctivitis or scleral icterus. ENT:  Oropharynx clear without lesion.  Tongue normal. Mucous membranes moist.  RESPIRATORY:  Clear to auscultation without rales, wheezes or rhonchi. CARDIOVASCULAR:  Regular rate and rhythm without murmur, rub or gallop. ABDOMEN:  Soft, non-tender, with active bowel sounds, and no hepatosplenomegaly.  No masses. SKIN:  No rashes or ulcers. EXTREMITIES: No edema, no skin discoloration or tenderness.  No palpable cords. LYMPH NODES: No palpable cervical, supraclavicular, axillary or inguinal adenopathy  NEUROLOGICAL: Unremarkable. PSYCH:  Appropriate.    Appointment on 01/21/2017  Component Date Value Ref Range Status  . WBC 01/21/2017 2.1* 3.8 - 10.6 K/uL Final  . RBC 01/21/2017 2.84* 4.40 - 5.90 MIL/uL Final  . Hemoglobin 01/21/2017 10.2* 13.0 - 18.0 g/dL Final  . HCT 01/21/2017 29.4* 40.0 - 52.0 % Final  . MCV 01/21/2017 103.6* 80.0 - 100.0 fL Final  . MCH 01/21/2017 36.0* 26.0 - 34.0 pg Final  . MCHC 01/21/2017 34.7  32.0 - 36.0 g/dL Final  . RDW 01/21/2017 19.4* 11.5 - 14.5 % Final  . Platelets 01/21/2017 64* 150 - 440 K/uL Final  . Neutrophils Relative % 01/21/2017 47  % Final  . Lymphocytes Relative 01/21/2017 37  % Final  . Monocytes Relative 01/21/2017 3  % Final  . Eosinophils Relative 01/21/2017 12  % Final  . Basophils Relative 01/21/2017 1  % Final  . Neutro Abs 01/21/2017 0.9* 1.4 - 6.5 K/uL Final  . Lymphs Abs 01/21/2017 0.8* 1.0 - 3.6 K/uL Final  . Monocytes Absolute 01/21/2017 0.1* 0.2 - 1.0 K/uL Final  . Eosinophils Absolute 01/21/2017 0.3  0 - 0.7 K/uL Final  . Basophils Absolute 01/21/2017 0.0  0 - 0.1 K/uL Final  . RBC Morphology 01/21/2017 RARE NRBCs  Final   Comment: MIXED RBC POPULATION POLYCHROMASIA PRESENT   . Sodium 01/21/2017 136  135 - 145 mmol/L Final  . Potassium  01/21/2017 4.0  3.5 - 5.1 mmol/L Final  . Chloride 01/21/2017 105  101 - 111 mmol/L Final  . CO2 01/21/2017 23  22 - 32 mmol/L Final  . Glucose, Bld 01/21/2017 293* 65 - 99 mg/dL Final  . BUN 01/21/2017 30* 6 - 20 mg/dL Final  . Creatinine, Ser 01/21/2017 1.40* 0.61 - 1.24 mg/dL Final  . Calcium 01/21/2017 9.2  8.9 - 10.3 mg/dL Final  . Total Protein 01/21/2017 6.7  6.5 - 8.1 g/dL Final  . Albumin 01/21/2017 3.7  3.5 - 5.0 g/dL Final  . AST 01/21/2017 23  15 - 41 U/L Final  . ALT 01/21/2017 23  17 - 63 U/L Final  . Alkaline Phosphatase 01/21/2017 64  38 - 126 U/L Final  . Total Bilirubin 01/21/2017 1.0  0.3 - 1.2 mg/dL Final  . GFR calc non Af Amer 01/21/2017 46* >60 mL/min Final  . GFR calc Af Amer 01/21/2017 53* >60 mL/min Final   Comment: (NOTE) The eGFR has been calculated using the CKD EPI equation. This calculation has not been validated in all clinical situations. eGFR's persistently <60 mL/min signify possible Chronic Kidney Disease.   . Anion gap 01/21/2017 8  5 - 15 Final  . Magnesium 01/21/2017 1.9  1.7 - 2.4 mg/dL Final    Assessment:  ROWIN BAYRON is a 82 y.o. 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  He was diagnosed with hairy cell leukemia in 2006.  He received cladribine 11/06-11/05/2005.  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   Bone marrow aspirate and biopsy on 02/07/2015 revealed a extensive marrow involvement by recurrent hairy cell leukemia (approximately 80% of cells in the core). There was a monoclonal plasma cell infiltrate (approximately 10%), compatible with a plasma cell neoplasm. Marrow was hypercellular (40-50%) with markedly diminished residual trilineage hematopoiesis and mild multilineage dyspoiesis.  Peripheral smear revealed leukopenia (WBC 1900) with 2% atypical lymphoid cells compatible with hairy cell leukemia. There was moderate anemia with anisopoikilocytosis. FISH studies revealed an abnormal myeloma  panel with CCND1/IGH translocation t (11;14) and loss of MAF/16q.  FISH studies for MDS were negative.  Cytogenetics were normal (46, XY).  He has an unclear allergy history.  He may be allergic to Primaxin, Zoloft, allopurinol, and a chemotherapy medication.  Reaction occurred in 06/2005.  He is allergic to sulfa.  He is not allergic to dapsone.  He requires washed RBCs.  He is on prophylactic acyclovir.  Dapsone and voriconazole were discontinued.  He has a history of shingles prior to prophylactic acyclovir.  He is 17 months status post cladribine (began 03/03/2015).  His counts plateaued on 07/25/2015 and have subsequently decreased.  Labs on 07/25/2015 revealed a normal B12 (894), folate (60.5), and TSH (2.562).  Ferritin was 50 on 04/26/2016.  Labs on 08/22/2016 revealed a normal B12, folate, and TSH.  He is on oral B12.  Bone marrow on 11/03/2015 revealed persistent plasma cell neoplasm, now with mildy atypical monoclonal plasma cells estimated at 20-30%.   There was no morphologic evidence of residual hairy cell leukemia.  There was a minute population (0.03%) with hairy cell immunophenotype detected by flow.  Marrow was normocellular for age with relative erythroid hyperplasia, relative myeloid hypoplasia, mild dyspoiesis, adequate megakaryocytes and no increased blasts.  There was no significant increase in marrow reticulin fibers.  Iron was present.  FISH studies for MDS were negative.  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  Cytogenetics were normal (46, XY).  Bone marrow on 03/13/2016 was normocellular to marginally hypercellular for age (20-40%) with relative erythroid hyperplasia and mild dyserythropoiesis, decreased granulopoiesis and adequate megakaryocytes.  There was a persistent 15-20% plasma cell neoplasm.  There was no overt increase in reticulin fibrosis.  Storage iron was present.  Flow cytometry revealed abnormal/monocytic plasma  cell population (3% sample), consistent with a plasma cell neoplastic process.  Cytogenetics were normal (46, XY).  FISH studies for myeloma revealed a CCND1/IGH translocation, t(11;14) in 53% of nuclei.  SPEP has been followed: 1.0 on 02/03/2015, 1.4 gm/dL on 07/25/2015, 1.3 gm/dL on 11/17/2015, 1.2 gm/dL on 06/28/2016 and 1.0 gm/dL on 09/20/2016.    Kappa free light chains have been followed: 47.63 (ratio 3.12) on 02/03/2015, 54.9 (ratio 3.14) on 02/07/2015, 46.23 (ratio 3.68) on 07/25/2015, and 50.5 (ratio 4.82) on 11/17/2015.  Beta2-microglobulin was 2.5 on 11/17/2015.  24 hour urine on 11/21/2015 revealed 234.8 mg protein in 24 hours (no monoclonal protein).  Bone survey on 11/24/2015 revealed no lytic lesions.  Epo level was 329.6 on 03/29/2016.  He began a trial of Procrit.  He received 10,000 units of Procrit weekly (04/26/2016 - 06/14/2016).  Hemoglobin ranged between 8.4 and 8.9 without trend.    He is s/p 1 cycle ofRevlimid (10 mg) and Decadron(06/29/2016 - 07/19/2016). Cycle #1 was complicated by anemia and neutropenia. He has received 4 units of PRBCs(1 unit:07/22/2016 and 2 units: 08/07/2016, and 1 unit on 01/14/2017). He received 6 days ofGCSF.  He is currently day 19 of cycle #4 Revlimid (72m) and Decadron (began 09/14/2016).  He began weekly GCSF on 11/05/2016.  He receives GCSF if his ANC is <= 1500.  He is on aspirin 81 mg a day prophylaxis.  PET scan on 03/14/2016 revealed no evidence of hypermetabolic lymphadenopathy or focal skeletal lesions to suggest marrow or cortical bone involvement.  Symptomatically, he is a little fatigued.  Exam is stable.  Counts include a hematocrit 28.3, hemoglobin of 9.8, platelets 83,000 and ANC 1600.  Plan: 1.  Labs today:  CBC with diff, CMP, Mg. 2.  Continue Revlimid 5 mg a day (3 weeks on and 1 week off). 3.  GCSF 480 mcg today. 4.  RTC weekly x 5 for labs (CBC with diff) +/- Neupogen. 5.  Add to labs in 4 weeks only: CMP,  Mg. 6.  RTC in 6 weeks for MD assessment, labs (CBC with diff, CMP, Mg) and +/- Neupogen.   MLequita Asal MD  01/21/2017, 12:10 PM

## 2017-01-21 NOTE — Progress Notes (Signed)
Patient offers no complaints today. 

## 2017-01-23 ENCOUNTER — Other Ambulatory Visit: Payer: Self-pay | Admitting: Family Medicine

## 2017-01-28 ENCOUNTER — Inpatient Hospital Stay: Payer: Medicare Other

## 2017-01-28 ENCOUNTER — Other Ambulatory Visit: Payer: Self-pay

## 2017-01-28 DIAGNOSIS — C9140 Hairy cell leukemia not having achieved remission: Secondary | ICD-10-CM | POA: Diagnosis not present

## 2017-01-28 DIAGNOSIS — R5383 Other fatigue: Secondary | ICD-10-CM | POA: Diagnosis not present

## 2017-01-28 DIAGNOSIS — I10 Essential (primary) hypertension: Secondary | ICD-10-CM | POA: Diagnosis not present

## 2017-01-28 DIAGNOSIS — N400 Enlarged prostate without lower urinary tract symptoms: Secondary | ICD-10-CM | POA: Diagnosis not present

## 2017-01-28 DIAGNOSIS — Z86010 Personal history of colonic polyps: Secondary | ICD-10-CM | POA: Diagnosis not present

## 2017-01-28 DIAGNOSIS — D649 Anemia, unspecified: Secondary | ICD-10-CM | POA: Diagnosis not present

## 2017-01-28 DIAGNOSIS — D4620 Refractory anemia with excess of blasts, unspecified: Secondary | ICD-10-CM | POA: Diagnosis not present

## 2017-01-28 DIAGNOSIS — C9000 Multiple myeloma not having achieved remission: Secondary | ICD-10-CM | POA: Diagnosis not present

## 2017-01-28 DIAGNOSIS — C9141 Hairy cell leukemia, in remission: Secondary | ICD-10-CM

## 2017-01-28 DIAGNOSIS — R011 Cardiac murmur, unspecified: Secondary | ICD-10-CM | POA: Diagnosis not present

## 2017-01-28 DIAGNOSIS — Z85828 Personal history of other malignant neoplasm of skin: Secondary | ICD-10-CM | POA: Diagnosis not present

## 2017-01-28 DIAGNOSIS — Z79899 Other long term (current) drug therapy: Secondary | ICD-10-CM | POA: Diagnosis not present

## 2017-01-28 DIAGNOSIS — E538 Deficiency of other specified B group vitamins: Secondary | ICD-10-CM | POA: Diagnosis not present

## 2017-01-28 DIAGNOSIS — Z7984 Long term (current) use of oral hypoglycemic drugs: Secondary | ICD-10-CM | POA: Diagnosis not present

## 2017-01-28 DIAGNOSIS — D46Z Other myelodysplastic syndromes: Secondary | ICD-10-CM

## 2017-01-28 DIAGNOSIS — Z803 Family history of malignant neoplasm of breast: Secondary | ICD-10-CM | POA: Diagnosis not present

## 2017-01-28 DIAGNOSIS — E785 Hyperlipidemia, unspecified: Secondary | ICD-10-CM | POA: Diagnosis not present

## 2017-01-28 DIAGNOSIS — Z8619 Personal history of other infectious and parasitic diseases: Secondary | ICD-10-CM | POA: Diagnosis not present

## 2017-01-28 DIAGNOSIS — E119 Type 2 diabetes mellitus without complications: Secondary | ICD-10-CM | POA: Diagnosis not present

## 2017-01-28 DIAGNOSIS — Z7982 Long term (current) use of aspirin: Secondary | ICD-10-CM | POA: Diagnosis not present

## 2017-01-28 DIAGNOSIS — K219 Gastro-esophageal reflux disease without esophagitis: Secondary | ICD-10-CM | POA: Diagnosis not present

## 2017-01-28 LAB — CBC WITH DIFFERENTIAL/PLATELET
Basophils Absolute: 0 10*3/uL (ref 0–0.1)
Basophils Relative: 1 %
Eosinophils Absolute: 0.1 10*3/uL (ref 0–0.7)
Eosinophils Relative: 8 %
HCT: 24.8 % — ABNORMAL LOW (ref 40.0–52.0)
Hemoglobin: 8.6 g/dL — ABNORMAL LOW (ref 13.0–18.0)
Lymphocytes Relative: 30 %
Lymphs Abs: 0.4 10*3/uL — ABNORMAL LOW (ref 1.0–3.6)
MCH: 36.1 pg — ABNORMAL HIGH (ref 26.0–34.0)
MCHC: 34.8 g/dL (ref 32.0–36.0)
MCV: 103.7 fL — ABNORMAL HIGH (ref 80.0–100.0)
Monocytes Absolute: 0.1 10*3/uL — ABNORMAL LOW (ref 0.2–1.0)
Monocytes Relative: 7 %
Neutro Abs: 0.8 10*3/uL — ABNORMAL LOW (ref 1.4–6.5)
Neutrophils Relative %: 54 %
Platelets: 69 10*3/uL — ABNORMAL LOW (ref 150–440)
RBC: 2.39 MIL/uL — ABNORMAL LOW (ref 4.40–5.90)
RDW: 19.5 % — ABNORMAL HIGH (ref 11.5–14.5)
WBC: 1.5 10*3/uL — ABNORMAL LOW (ref 3.8–10.6)

## 2017-01-28 MED ORDER — FILGRASTIM 480 MCG/0.8ML IJ SOSY: 480.0000 ug | PREFILLED_SYRINGE | Freq: Once | INTRAMUSCULAR | Status: DC

## 2017-01-28 MED ORDER — TBO-FILGRASTIM 480 MCG/0.8ML ~~LOC~~ SOSY
480.0000 ug | PREFILLED_SYRINGE | Freq: Once | SUBCUTANEOUS | Status: AC
  Administered 2017-01-28: 480 ug via SUBCUTANEOUS
  Filled 2017-01-28: qty 0.8

## 2017-01-31 ENCOUNTER — Telehealth: Payer: Self-pay | Admitting: *Deleted

## 2017-01-31 LAB — HM DIABETES EYE EXAM

## 2017-01-31 NOTE — Telephone Encounter (Signed)
-----   Message from Lequita Asal, MD sent at 01/29/2017  9:14 AM EDT ----- Regarding: Please call patient  Where is he in his Revlimid?  Is he receiving GCSF weekly?  M  ----- Message ----- From: Interface, Lab In [place] Sent: 01/28/2017   1:27 PM To: Lequita Asal, MD

## 2017-01-31 NOTE — Telephone Encounter (Signed)
Patient is restarting his revlimid today (June 150.

## 2017-02-04 ENCOUNTER — Inpatient Hospital Stay: Payer: Medicare Other

## 2017-02-04 DIAGNOSIS — Z7982 Long term (current) use of aspirin: Secondary | ICD-10-CM | POA: Diagnosis not present

## 2017-02-04 DIAGNOSIS — Z8619 Personal history of other infectious and parasitic diseases: Secondary | ICD-10-CM | POA: Diagnosis not present

## 2017-02-04 DIAGNOSIS — E538 Deficiency of other specified B group vitamins: Secondary | ICD-10-CM

## 2017-02-04 DIAGNOSIS — Z7984 Long term (current) use of oral hypoglycemic drugs: Secondary | ICD-10-CM | POA: Diagnosis not present

## 2017-02-04 DIAGNOSIS — R011 Cardiac murmur, unspecified: Secondary | ICD-10-CM | POA: Diagnosis not present

## 2017-02-04 DIAGNOSIS — Z79899 Other long term (current) drug therapy: Secondary | ICD-10-CM | POA: Diagnosis not present

## 2017-02-04 DIAGNOSIS — Z85828 Personal history of other malignant neoplasm of skin: Secondary | ICD-10-CM | POA: Diagnosis not present

## 2017-02-04 DIAGNOSIS — K219 Gastro-esophageal reflux disease without esophagitis: Secondary | ICD-10-CM | POA: Diagnosis not present

## 2017-02-04 DIAGNOSIS — N400 Enlarged prostate without lower urinary tract symptoms: Secondary | ICD-10-CM | POA: Diagnosis not present

## 2017-02-04 DIAGNOSIS — C9141 Hairy cell leukemia, in remission: Secondary | ICD-10-CM

## 2017-02-04 DIAGNOSIS — Z803 Family history of malignant neoplasm of breast: Secondary | ICD-10-CM | POA: Diagnosis not present

## 2017-02-04 DIAGNOSIS — C9000 Multiple myeloma not having achieved remission: Secondary | ICD-10-CM | POA: Diagnosis not present

## 2017-02-04 DIAGNOSIS — E119 Type 2 diabetes mellitus without complications: Secondary | ICD-10-CM | POA: Diagnosis not present

## 2017-02-04 DIAGNOSIS — I10 Essential (primary) hypertension: Secondary | ICD-10-CM | POA: Diagnosis not present

## 2017-02-04 DIAGNOSIS — D4620 Refractory anemia with excess of blasts, unspecified: Secondary | ICD-10-CM

## 2017-02-04 DIAGNOSIS — D649 Anemia, unspecified: Secondary | ICD-10-CM | POA: Diagnosis not present

## 2017-02-04 DIAGNOSIS — R5383 Other fatigue: Secondary | ICD-10-CM | POA: Diagnosis not present

## 2017-02-04 DIAGNOSIS — E785 Hyperlipidemia, unspecified: Secondary | ICD-10-CM | POA: Diagnosis not present

## 2017-02-04 DIAGNOSIS — C9140 Hairy cell leukemia not having achieved remission: Secondary | ICD-10-CM | POA: Diagnosis not present

## 2017-02-04 DIAGNOSIS — Z86010 Personal history of colonic polyps: Secondary | ICD-10-CM | POA: Diagnosis not present

## 2017-02-04 LAB — CBC WITH DIFFERENTIAL/PLATELET
Basophils Absolute: 0.1 10*3/uL (ref 0–0.1)
Basophils Relative: 2 %
Eosinophils Absolute: 0.2 10*3/uL (ref 0–0.7)
Eosinophils Relative: 6 %
HCT: 28.3 % — ABNORMAL LOW (ref 40.0–52.0)
Hemoglobin: 9.8 g/dL — ABNORMAL LOW (ref 13.0–18.0)
Lymphocytes Relative: 32 %
Lymphs Abs: 1 10*3/uL (ref 1.0–3.6)
MCH: 36.4 pg — ABNORMAL HIGH (ref 26.0–34.0)
MCHC: 34.7 g/dL (ref 32.0–36.0)
MCV: 104.8 fL — ABNORMAL HIGH (ref 80.0–100.0)
Monocytes Absolute: 0.1 10*3/uL — ABNORMAL LOW (ref 0.2–1.0)
Monocytes Relative: 3 %
Neutro Abs: 1.6 10*3/uL (ref 1.4–6.5)
Neutrophils Relative %: 57 %
Platelets: 83 10*3/uL — ABNORMAL LOW (ref 150–440)
RBC: 2.7 MIL/uL — ABNORMAL LOW (ref 4.40–5.90)
RDW: 19.8 % — ABNORMAL HIGH (ref 11.5–14.5)
Smear Review: DECREASED
WBC: 3 10*3/uL — ABNORMAL LOW (ref 3.8–10.6)

## 2017-02-08 ENCOUNTER — Encounter: Payer: Self-pay | Admitting: Hematology and Oncology

## 2017-02-11 ENCOUNTER — Inpatient Hospital Stay: Payer: Medicare Other

## 2017-02-11 DIAGNOSIS — R011 Cardiac murmur, unspecified: Secondary | ICD-10-CM | POA: Diagnosis not present

## 2017-02-11 DIAGNOSIS — C9000 Multiple myeloma not having achieved remission: Secondary | ICD-10-CM | POA: Diagnosis not present

## 2017-02-11 DIAGNOSIS — Z803 Family history of malignant neoplasm of breast: Secondary | ICD-10-CM | POA: Diagnosis not present

## 2017-02-11 DIAGNOSIS — C9140 Hairy cell leukemia not having achieved remission: Secondary | ICD-10-CM | POA: Diagnosis not present

## 2017-02-11 DIAGNOSIS — E119 Type 2 diabetes mellitus without complications: Secondary | ICD-10-CM | POA: Diagnosis not present

## 2017-02-11 DIAGNOSIS — I10 Essential (primary) hypertension: Secondary | ICD-10-CM | POA: Diagnosis not present

## 2017-02-11 DIAGNOSIS — K219 Gastro-esophageal reflux disease without esophagitis: Secondary | ICD-10-CM | POA: Diagnosis not present

## 2017-02-11 DIAGNOSIS — D4620 Refractory anemia with excess of blasts, unspecified: Secondary | ICD-10-CM

## 2017-02-11 DIAGNOSIS — R5383 Other fatigue: Secondary | ICD-10-CM | POA: Diagnosis not present

## 2017-02-11 DIAGNOSIS — E785 Hyperlipidemia, unspecified: Secondary | ICD-10-CM | POA: Diagnosis not present

## 2017-02-11 DIAGNOSIS — Z86010 Personal history of colonic polyps: Secondary | ICD-10-CM | POA: Diagnosis not present

## 2017-02-11 DIAGNOSIS — Z79899 Other long term (current) drug therapy: Secondary | ICD-10-CM | POA: Diagnosis not present

## 2017-02-11 DIAGNOSIS — Z7982 Long term (current) use of aspirin: Secondary | ICD-10-CM | POA: Diagnosis not present

## 2017-02-11 DIAGNOSIS — N400 Enlarged prostate without lower urinary tract symptoms: Secondary | ICD-10-CM | POA: Diagnosis not present

## 2017-02-11 DIAGNOSIS — Z7984 Long term (current) use of oral hypoglycemic drugs: Secondary | ICD-10-CM | POA: Diagnosis not present

## 2017-02-11 DIAGNOSIS — E538 Deficiency of other specified B group vitamins: Secondary | ICD-10-CM | POA: Diagnosis not present

## 2017-02-11 DIAGNOSIS — D649 Anemia, unspecified: Secondary | ICD-10-CM | POA: Diagnosis not present

## 2017-02-11 DIAGNOSIS — C9141 Hairy cell leukemia, in remission: Secondary | ICD-10-CM

## 2017-02-11 DIAGNOSIS — Z85828 Personal history of other malignant neoplasm of skin: Secondary | ICD-10-CM | POA: Diagnosis not present

## 2017-02-11 DIAGNOSIS — Z8619 Personal history of other infectious and parasitic diseases: Secondary | ICD-10-CM | POA: Diagnosis not present

## 2017-02-11 LAB — CBC WITH DIFFERENTIAL/PLATELET
Basophils Absolute: 0 10*3/uL (ref 0–0.1)
Basophils Relative: 1 %
Eosinophils Absolute: 0.2 10*3/uL (ref 0–0.7)
Eosinophils Relative: 7 %
HCT: 25 % — ABNORMAL LOW (ref 40.0–52.0)
Hemoglobin: 8.8 g/dL — ABNORMAL LOW (ref 13.0–18.0)
Lymphocytes Relative: 21 %
Lymphs Abs: 0.5 10*3/uL — ABNORMAL LOW (ref 1.0–3.6)
MCH: 37.1 pg — ABNORMAL HIGH (ref 26.0–34.0)
MCHC: 35.2 g/dL (ref 32.0–36.0)
MCV: 105.4 fL — ABNORMAL HIGH (ref 80.0–100.0)
Monocytes Absolute: 0.2 10*3/uL (ref 0.2–1.0)
Monocytes Relative: 8 %
Neutro Abs: 1.6 10*3/uL (ref 1.4–6.5)
Neutrophils Relative %: 63 %
Platelets: 74 10*3/uL — ABNORMAL LOW (ref 150–440)
RBC: 2.37 MIL/uL — ABNORMAL LOW (ref 4.40–5.90)
RDW: 19.4 % — ABNORMAL HIGH (ref 11.5–14.5)
WBC: 2.5 10*3/uL — ABNORMAL LOW (ref 3.8–10.6)

## 2017-02-18 ENCOUNTER — Inpatient Hospital Stay: Payer: Medicare Other | Attending: Hematology and Oncology

## 2017-02-18 ENCOUNTER — Other Ambulatory Visit: Payer: Self-pay | Admitting: *Deleted

## 2017-02-18 ENCOUNTER — Inpatient Hospital Stay: Payer: Medicare Other

## 2017-02-18 DIAGNOSIS — Z8619 Personal history of other infectious and parasitic diseases: Secondary | ICD-10-CM | POA: Insufficient documentation

## 2017-02-18 DIAGNOSIS — R011 Cardiac murmur, unspecified: Secondary | ICD-10-CM | POA: Insufficient documentation

## 2017-02-18 DIAGNOSIS — E785 Hyperlipidemia, unspecified: Secondary | ICD-10-CM | POA: Insufficient documentation

## 2017-02-18 DIAGNOSIS — I10 Essential (primary) hypertension: Secondary | ICD-10-CM | POA: Insufficient documentation

## 2017-02-18 DIAGNOSIS — C9141 Hairy cell leukemia, in remission: Secondary | ICD-10-CM | POA: Insufficient documentation

## 2017-02-18 DIAGNOSIS — E11319 Type 2 diabetes mellitus with unspecified diabetic retinopathy without macular edema: Secondary | ICD-10-CM | POA: Insufficient documentation

## 2017-02-18 DIAGNOSIS — Z85828 Personal history of other malignant neoplasm of skin: Secondary | ICD-10-CM | POA: Diagnosis not present

## 2017-02-18 DIAGNOSIS — Z7689 Persons encountering health services in other specified circumstances: Secondary | ICD-10-CM | POA: Diagnosis not present

## 2017-02-18 DIAGNOSIS — Z794 Long term (current) use of insulin: Secondary | ICD-10-CM | POA: Diagnosis not present

## 2017-02-18 DIAGNOSIS — Z87891 Personal history of nicotine dependence: Secondary | ICD-10-CM | POA: Insufficient documentation

## 2017-02-18 DIAGNOSIS — R5383 Other fatigue: Secondary | ICD-10-CM | POA: Insufficient documentation

## 2017-02-18 DIAGNOSIS — Z79899 Other long term (current) drug therapy: Secondary | ICD-10-CM | POA: Insufficient documentation

## 2017-02-18 DIAGNOSIS — D649 Anemia, unspecified: Secondary | ICD-10-CM | POA: Insufficient documentation

## 2017-02-18 DIAGNOSIS — Z7982 Long term (current) use of aspirin: Secondary | ICD-10-CM | POA: Insufficient documentation

## 2017-02-18 DIAGNOSIS — D46Z Other myelodysplastic syndromes: Secondary | ICD-10-CM

## 2017-02-18 DIAGNOSIS — D4620 Refractory anemia with excess of blasts, unspecified: Secondary | ICD-10-CM

## 2017-02-18 DIAGNOSIS — Z8701 Personal history of pneumonia (recurrent): Secondary | ICD-10-CM | POA: Insufficient documentation

## 2017-02-18 DIAGNOSIS — Z86010 Personal history of colonic polyps: Secondary | ICD-10-CM | POA: Diagnosis not present

## 2017-02-18 DIAGNOSIS — K219 Gastro-esophageal reflux disease without esophagitis: Secondary | ICD-10-CM | POA: Insufficient documentation

## 2017-02-18 DIAGNOSIS — C9000 Multiple myeloma not having achieved remission: Secondary | ICD-10-CM

## 2017-02-18 DIAGNOSIS — E538 Deficiency of other specified B group vitamins: Secondary | ICD-10-CM | POA: Diagnosis not present

## 2017-02-18 DIAGNOSIS — N400 Enlarged prostate without lower urinary tract symptoms: Secondary | ICD-10-CM | POA: Insufficient documentation

## 2017-02-18 LAB — CBC WITH DIFFERENTIAL/PLATELET
Basophils Absolute: 0 10*3/uL (ref 0–0.1)
Basophils Relative: 2 %
Eosinophils Absolute: 0.2 10*3/uL (ref 0–0.7)
Eosinophils Relative: 8 %
HCT: 26.6 % — ABNORMAL LOW (ref 40.0–52.0)
Hemoglobin: 9.3 g/dL — ABNORMAL LOW (ref 13.0–18.0)
Lymphocytes Relative: 23 %
Lymphs Abs: 0.6 10*3/uL — ABNORMAL LOW (ref 1.0–3.6)
MCH: 36.8 pg — ABNORMAL HIGH (ref 26.0–34.0)
MCHC: 34.9 g/dL (ref 32.0–36.0)
MCV: 105.4 fL — ABNORMAL HIGH (ref 80.0–100.0)
Monocytes Absolute: 0.2 10*3/uL (ref 0.2–1.0)
Monocytes Relative: 8 %
Neutro Abs: 1.4 10*3/uL (ref 1.4–6.5)
Neutrophils Relative %: 59 %
Platelets: 77 10*3/uL — ABNORMAL LOW (ref 150–440)
RBC: 2.53 MIL/uL — ABNORMAL LOW (ref 4.40–5.90)
RDW: 18.5 % — ABNORMAL HIGH (ref 11.5–14.5)
Smear Review: DECREASED
WBC: 2.4 10*3/uL — ABNORMAL LOW (ref 3.8–10.6)

## 2017-02-18 LAB — COMPREHENSIVE METABOLIC PANEL
ALT: 26 U/L (ref 17–63)
AST: 25 U/L (ref 15–41)
Albumin: 3.5 g/dL (ref 3.5–5.0)
Alkaline Phosphatase: 63 U/L (ref 38–126)
Anion gap: 10 (ref 5–15)
BUN: 30 mg/dL — ABNORMAL HIGH (ref 6–20)
CO2: 23 mmol/L (ref 22–32)
Calcium: 9 mg/dL (ref 8.9–10.3)
Chloride: 103 mmol/L (ref 101–111)
Creatinine, Ser: 1.43 mg/dL — ABNORMAL HIGH (ref 0.61–1.24)
GFR calc Af Amer: 52 mL/min — ABNORMAL LOW (ref >60)
GFR calc non Af Amer: 45 mL/min — ABNORMAL LOW (ref >60)
Glucose, Bld: 357 mg/dL — ABNORMAL HIGH (ref 65–99)
Potassium: 3.9 mmol/L (ref 3.5–5.1)
Sodium: 136 mmol/L (ref 135–145)
Total Bilirubin: 0.8 mg/dL (ref 0.3–1.2)
Total Protein: 6.3 g/dL — ABNORMAL LOW (ref 6.5–8.1)

## 2017-02-18 LAB — MAGNESIUM: Magnesium: 1.8 mg/dL (ref 1.7–2.4)

## 2017-02-18 MED ORDER — TBO-FILGRASTIM 480 MCG/0.8ML ~~LOC~~ SOSY
480.0000 ug | PREFILLED_SYRINGE | Freq: Once | SUBCUTANEOUS | Status: AC
  Administered 2017-02-18: 480 ug via SUBCUTANEOUS

## 2017-02-18 MED ORDER — LENALIDOMIDE 5 MG PO CAPS: ORAL_CAPSULE | ORAL | 0 refills | Status: DC

## 2017-02-18 MED ORDER — TBO-FILGRASTIM 480 MCG/0.8ML ~~LOC~~ SOSY
480.0000 ug | PREFILLED_SYRINGE | Freq: Once | SUBCUTANEOUS | Status: DC
Start: 2017-02-18 — End: 2017-02-18

## 2017-02-18 MED ORDER — FILGRASTIM 480 MCG/0.8ML IJ SOSY: 480.0000 ug | PREFILLED_SYRINGE | Freq: Once | INTRAMUSCULAR | Status: DC

## 2017-02-18 NOTE — Progress Notes (Signed)
Glucose of 357 today.  Patient informed and copy of labs sent to PCP, Dr. Danise Mina

## 2017-02-20 DIAGNOSIS — C44311 Basal cell carcinoma of skin of nose: Secondary | ICD-10-CM | POA: Diagnosis not present

## 2017-02-25 ENCOUNTER — Inpatient Hospital Stay: Payer: Medicare Other

## 2017-02-25 ENCOUNTER — Ambulatory Visit: Payer: Medicare Other

## 2017-02-25 ENCOUNTER — Other Ambulatory Visit: Payer: Medicare Other

## 2017-02-26 ENCOUNTER — Inpatient Hospital Stay: Payer: Medicare Other

## 2017-02-26 ENCOUNTER — Other Ambulatory Visit: Payer: Self-pay | Admitting: *Deleted

## 2017-02-26 ENCOUNTER — Other Ambulatory Visit: Payer: Self-pay | Admitting: Hematology and Oncology

## 2017-02-26 DIAGNOSIS — Z95828 Presence of other vascular implants and grafts: Secondary | ICD-10-CM

## 2017-02-26 DIAGNOSIS — R5383 Other fatigue: Secondary | ICD-10-CM | POA: Diagnosis not present

## 2017-02-26 DIAGNOSIS — Z794 Long term (current) use of insulin: Secondary | ICD-10-CM | POA: Diagnosis not present

## 2017-02-26 DIAGNOSIS — C9141 Hairy cell leukemia, in remission: Secondary | ICD-10-CM

## 2017-02-26 DIAGNOSIS — D4620 Refractory anemia with excess of blasts, unspecified: Secondary | ICD-10-CM

## 2017-02-26 DIAGNOSIS — C9000 Multiple myeloma not having achieved remission: Secondary | ICD-10-CM

## 2017-02-26 DIAGNOSIS — Z87891 Personal history of nicotine dependence: Secondary | ICD-10-CM | POA: Diagnosis not present

## 2017-02-26 DIAGNOSIS — D46Z Other myelodysplastic syndromes: Secondary | ICD-10-CM

## 2017-02-26 DIAGNOSIS — Z79899 Other long term (current) drug therapy: Secondary | ICD-10-CM | POA: Diagnosis not present

## 2017-02-26 DIAGNOSIS — E11319 Type 2 diabetes mellitus with unspecified diabetic retinopathy without macular edema: Secondary | ICD-10-CM | POA: Diagnosis not present

## 2017-02-26 DIAGNOSIS — N400 Enlarged prostate without lower urinary tract symptoms: Secondary | ICD-10-CM | POA: Diagnosis not present

## 2017-02-26 DIAGNOSIS — D649 Anemia, unspecified: Secondary | ICD-10-CM | POA: Diagnosis not present

## 2017-02-26 DIAGNOSIS — E538 Deficiency of other specified B group vitamins: Secondary | ICD-10-CM

## 2017-02-26 DIAGNOSIS — R011 Cardiac murmur, unspecified: Secondary | ICD-10-CM | POA: Diagnosis not present

## 2017-02-26 DIAGNOSIS — D539 Nutritional anemia, unspecified: Secondary | ICD-10-CM

## 2017-02-26 DIAGNOSIS — Z86010 Personal history of colonic polyps: Secondary | ICD-10-CM | POA: Diagnosis not present

## 2017-02-26 DIAGNOSIS — Z8619 Personal history of other infectious and parasitic diseases: Secondary | ICD-10-CM | POA: Diagnosis not present

## 2017-02-26 DIAGNOSIS — Z7982 Long term (current) use of aspirin: Secondary | ICD-10-CM | POA: Diagnosis not present

## 2017-02-26 DIAGNOSIS — Z85828 Personal history of other malignant neoplasm of skin: Secondary | ICD-10-CM | POA: Diagnosis not present

## 2017-02-26 DIAGNOSIS — E785 Hyperlipidemia, unspecified: Secondary | ICD-10-CM | POA: Diagnosis not present

## 2017-02-26 DIAGNOSIS — Z7689 Persons encountering health services in other specified circumstances: Secondary | ICD-10-CM | POA: Diagnosis not present

## 2017-02-26 DIAGNOSIS — K219 Gastro-esophageal reflux disease without esophagitis: Secondary | ICD-10-CM | POA: Diagnosis not present

## 2017-02-26 DIAGNOSIS — I10 Essential (primary) hypertension: Secondary | ICD-10-CM | POA: Diagnosis not present

## 2017-02-26 LAB — CBC WITH DIFFERENTIAL/PLATELET
Basophils Absolute: 0 10*3/uL (ref 0–0.1)
Basophils Relative: 2 %
Eosinophils Absolute: 0.1 10*3/uL (ref 0–0.7)
Eosinophils Relative: 6 %
HCT: 23.6 % — ABNORMAL LOW (ref 40.0–52.0)
Hemoglobin: 8.2 g/dL — ABNORMAL LOW (ref 13.0–18.0)
Lymphocytes Relative: 28 %
Lymphs Abs: 0.5 10*3/uL — ABNORMAL LOW (ref 1.0–3.6)
MCH: 37 pg — ABNORMAL HIGH (ref 26.0–34.0)
MCHC: 34.8 g/dL (ref 32.0–36.0)
MCV: 106.3 fL — ABNORMAL HIGH (ref 80.0–100.0)
Monocytes Absolute: 0.1 10*3/uL — ABNORMAL LOW (ref 0.2–1.0)
Monocytes Relative: 5 %
Neutro Abs: 1.1 10*3/uL — ABNORMAL LOW (ref 1.4–6.5)
Neutrophils Relative %: 59 %
Platelets: 57 10*3/uL — ABNORMAL LOW (ref 150–440)
RBC: 2.22 MIL/uL — ABNORMAL LOW (ref 4.40–5.90)
RDW: 18.5 % — ABNORMAL HIGH (ref 11.5–14.5)
WBC: 1.8 10*3/uL — ABNORMAL LOW (ref 3.8–10.6)

## 2017-02-26 MED ORDER — SODIUM CHLORIDE 0.9% FLUSH
10.0000 mL | INTRAVENOUS | Status: DC | PRN
  Administered 2017-02-26: 10 mL via INTRAVENOUS
  Filled 2017-02-26: qty 10

## 2017-02-26 MED ORDER — HEPARIN SOD (PORK) LOCK FLUSH 100 UNIT/ML IV SOLN
500.0000 [IU] | Freq: Once | INTRAVENOUS | Status: AC
  Administered 2017-02-26: 500 [IU] via INTRAVENOUS

## 2017-02-26 MED ORDER — TBO-FILGRASTIM 480 MCG/0.8ML ~~LOC~~ SOSY
480.0000 ug | PREFILLED_SYRINGE | Freq: Once | SUBCUTANEOUS | Status: AC
  Administered 2017-02-26: 480 ug via SUBCUTANEOUS

## 2017-02-26 MED ORDER — FILGRASTIM 480 MCG/0.8ML IJ SOSY: 480.0000 ug | PREFILLED_SYRINGE | Freq: Once | INTRAMUSCULAR | Status: DC

## 2017-02-26 NOTE — Progress Notes (Unsigned)
Hemoglobin is 8.2 today and he does offer symptoms of fatigue with exertional SOBr.  Dr. [name] would like him to receive 1 unite of blood tomorrow.

## 2017-02-26 NOTE — Addendum Note (Signed)
Addended by: Oneida Arenas on: 02/26/2017 03:16 PM   Modules accepted: Orders

## 2017-02-27 ENCOUNTER — Telehealth: Payer: Self-pay | Admitting: *Deleted

## 2017-02-27 ENCOUNTER — Inpatient Hospital Stay: Payer: Medicare Other

## 2017-02-27 DIAGNOSIS — I10 Essential (primary) hypertension: Secondary | ICD-10-CM | POA: Diagnosis not present

## 2017-02-27 DIAGNOSIS — D4620 Refractory anemia with excess of blasts, unspecified: Secondary | ICD-10-CM | POA: Diagnosis not present

## 2017-02-27 DIAGNOSIS — Z86010 Personal history of colonic polyps: Secondary | ICD-10-CM | POA: Diagnosis not present

## 2017-02-27 DIAGNOSIS — C9141 Hairy cell leukemia, in remission: Secondary | ICD-10-CM | POA: Diagnosis not present

## 2017-02-27 DIAGNOSIS — Z8619 Personal history of other infectious and parasitic diseases: Secondary | ICD-10-CM | POA: Diagnosis not present

## 2017-02-27 DIAGNOSIS — Z87891 Personal history of nicotine dependence: Secondary | ICD-10-CM | POA: Diagnosis not present

## 2017-02-27 DIAGNOSIS — Z85828 Personal history of other malignant neoplasm of skin: Secondary | ICD-10-CM | POA: Diagnosis not present

## 2017-02-27 DIAGNOSIS — R011 Cardiac murmur, unspecified: Secondary | ICD-10-CM | POA: Diagnosis not present

## 2017-02-27 DIAGNOSIS — Z7982 Long term (current) use of aspirin: Secondary | ICD-10-CM | POA: Diagnosis not present

## 2017-02-27 DIAGNOSIS — D539 Nutritional anemia, unspecified: Secondary | ICD-10-CM

## 2017-02-27 DIAGNOSIS — N400 Enlarged prostate without lower urinary tract symptoms: Secondary | ICD-10-CM | POA: Diagnosis not present

## 2017-02-27 DIAGNOSIS — K219 Gastro-esophageal reflux disease without esophagitis: Secondary | ICD-10-CM | POA: Diagnosis not present

## 2017-02-27 DIAGNOSIS — Z7689 Persons encountering health services in other specified circumstances: Secondary | ICD-10-CM | POA: Diagnosis not present

## 2017-02-27 DIAGNOSIS — E11319 Type 2 diabetes mellitus with unspecified diabetic retinopathy without macular edema: Secondary | ICD-10-CM | POA: Diagnosis not present

## 2017-02-27 DIAGNOSIS — D649 Anemia, unspecified: Secondary | ICD-10-CM | POA: Diagnosis not present

## 2017-02-27 DIAGNOSIS — E538 Deficiency of other specified B group vitamins: Secondary | ICD-10-CM | POA: Diagnosis not present

## 2017-02-27 DIAGNOSIS — Z79899 Other long term (current) drug therapy: Secondary | ICD-10-CM | POA: Diagnosis not present

## 2017-02-27 DIAGNOSIS — E785 Hyperlipidemia, unspecified: Secondary | ICD-10-CM | POA: Diagnosis not present

## 2017-02-27 DIAGNOSIS — Z794 Long term (current) use of insulin: Secondary | ICD-10-CM | POA: Diagnosis not present

## 2017-02-27 DIAGNOSIS — R5383 Other fatigue: Secondary | ICD-10-CM | POA: Diagnosis not present

## 2017-02-27 LAB — PREPARE RBC (CROSSMATCH)

## 2017-02-27 MED ORDER — SODIUM CHLORIDE 0.9 % IV SOLN
250.0000 mL | Freq: Once | INTRAVENOUS | Status: AC
  Administered 2017-02-27: 250 mL via INTRAVENOUS
  Filled 2017-02-27: qty 250

## 2017-02-27 MED ORDER — ACETAMINOPHEN 325 MG PO TABS
650.0000 mg | ORAL_TABLET | Freq: Once | ORAL | Status: AC
  Administered 2017-02-27: 650 mg via ORAL
  Filled 2017-02-27: qty 2

## 2017-02-27 MED ORDER — HEPARIN SOD (PORK) LOCK FLUSH 100 UNIT/ML IV SOLN
500.0000 [IU] | Freq: Every day | INTRAVENOUS | Status: AC | PRN
  Administered 2017-02-27: 500 [IU]
  Filled 2017-02-27: qty 5

## 2017-02-27 MED ORDER — SODIUM CHLORIDE 0.9% FLUSH
10.0000 mL | INTRAVENOUS | Status: AC | PRN
  Administered 2017-02-27: 10 mL
  Filled 2017-02-27: qty 10

## 2017-02-27 MED ORDER — DIPHENHYDRAMINE HCL 25 MG PO CAPS
25.0000 mg | ORAL_CAPSULE | Freq: Once | ORAL | Status: AC
  Administered 2017-02-27: 25 mg via ORAL
  Filled 2017-02-27: qty 1

## 2017-02-27 NOTE — Telephone Encounter (Signed)
Instructed patient that we were going to put his revlimid on hold at this time to evaluate the diarrhea he had been experiencing on and off and to go by the lab today to get a container and instructions for stool evaluation. Voiced understanding.

## 2017-02-28 ENCOUNTER — Telehealth: Payer: Self-pay | Admitting: *Deleted

## 2017-02-28 ENCOUNTER — Other Ambulatory Visit: Payer: Self-pay | Admitting: *Deleted

## 2017-02-28 ENCOUNTER — Other Ambulatory Visit: Payer: Self-pay

## 2017-02-28 DIAGNOSIS — Z79899 Other long term (current) drug therapy: Secondary | ICD-10-CM | POA: Diagnosis not present

## 2017-02-28 DIAGNOSIS — Z794 Long term (current) use of insulin: Secondary | ICD-10-CM | POA: Diagnosis not present

## 2017-02-28 DIAGNOSIS — C9141 Hairy cell leukemia, in remission: Secondary | ICD-10-CM

## 2017-02-28 DIAGNOSIS — Z7982 Long term (current) use of aspirin: Secondary | ICD-10-CM | POA: Diagnosis not present

## 2017-02-28 DIAGNOSIS — Z87891 Personal history of nicotine dependence: Secondary | ICD-10-CM | POA: Diagnosis not present

## 2017-02-28 DIAGNOSIS — D649 Anemia, unspecified: Secondary | ICD-10-CM | POA: Diagnosis not present

## 2017-02-28 DIAGNOSIS — Z7689 Persons encountering health services in other specified circumstances: Secondary | ICD-10-CM | POA: Diagnosis not present

## 2017-02-28 DIAGNOSIS — E11319 Type 2 diabetes mellitus with unspecified diabetic retinopathy without macular edema: Secondary | ICD-10-CM | POA: Diagnosis not present

## 2017-02-28 DIAGNOSIS — Z8619 Personal history of other infectious and parasitic diseases: Secondary | ICD-10-CM | POA: Diagnosis not present

## 2017-02-28 DIAGNOSIS — D539 Nutritional anemia, unspecified: Secondary | ICD-10-CM

## 2017-02-28 DIAGNOSIS — E785 Hyperlipidemia, unspecified: Secondary | ICD-10-CM | POA: Diagnosis not present

## 2017-02-28 DIAGNOSIS — Z86010 Personal history of colonic polyps: Secondary | ICD-10-CM | POA: Diagnosis not present

## 2017-02-28 DIAGNOSIS — C9000 Multiple myeloma not having achieved remission: Secondary | ICD-10-CM

## 2017-02-28 DIAGNOSIS — D4620 Refractory anemia with excess of blasts, unspecified: Secondary | ICD-10-CM | POA: Diagnosis not present

## 2017-02-28 DIAGNOSIS — E538 Deficiency of other specified B group vitamins: Secondary | ICD-10-CM | POA: Diagnosis not present

## 2017-02-28 DIAGNOSIS — N400 Enlarged prostate without lower urinary tract symptoms: Secondary | ICD-10-CM | POA: Diagnosis not present

## 2017-02-28 DIAGNOSIS — K219 Gastro-esophageal reflux disease without esophagitis: Secondary | ICD-10-CM | POA: Diagnosis not present

## 2017-02-28 DIAGNOSIS — R5383 Other fatigue: Secondary | ICD-10-CM | POA: Diagnosis not present

## 2017-02-28 DIAGNOSIS — R011 Cardiac murmur, unspecified: Secondary | ICD-10-CM | POA: Diagnosis not present

## 2017-02-28 DIAGNOSIS — Z85828 Personal history of other malignant neoplasm of skin: Secondary | ICD-10-CM | POA: Diagnosis not present

## 2017-02-28 DIAGNOSIS — I10 Essential (primary) hypertension: Secondary | ICD-10-CM | POA: Diagnosis not present

## 2017-02-28 LAB — TYPE AND SCREEN
ABO/RH(D): A NEG
Antibody Screen: NEGATIVE
Unit division: 0

## 2017-02-28 LAB — GASTROINTESTINAL PANEL BY PCR, STOOL (REPLACES STOOL CULTURE)

## 2017-02-28 LAB — BPAM RBC
Blood Product Expiration Date: 201807130005
ISSUE DATE / TIME: 201807121415
Unit Type and Rh: 600

## 2017-02-28 NOTE — Telephone Encounter (Signed)
Called patient and dicussed with him that his stool test were negative but that we needed to check another one to come by the cancer center one day next week and get a cup for specimen, voiced understanding.

## 2017-03-03 ENCOUNTER — Telehealth: Payer: Self-pay | Admitting: *Deleted

## 2017-03-03 NOTE — Telephone Encounter (Signed)
-----   Message from Lequita Asal, MD sent at 02/28/2017  4:44 PM EDT ----- Regarding: Please call patient and lab  Stool negative.  C diff not available.  If still having diarrhea, stool for C diff by PCR.  M  ----- Message ----- From: Interface, Lab In [place] Sent: 02/28/2017   2:28 PM To: Lequita Asal, MD

## 2017-03-03 NOTE — Telephone Encounter (Signed)
Attempted to call patient.  No answer.  Will call back later.

## 2017-03-03 NOTE — Telephone Encounter (Signed)
Patient was contacted on Friday by Leana Roe, RN.

## 2017-03-04 ENCOUNTER — Inpatient Hospital Stay: Payer: Medicare Other

## 2017-03-04 ENCOUNTER — Encounter: Payer: Self-pay | Admitting: Hematology and Oncology

## 2017-03-04 ENCOUNTER — Inpatient Hospital Stay (HOSPITAL_BASED_OUTPATIENT_CLINIC_OR_DEPARTMENT_OTHER): Payer: Medicare Other | Admitting: Hematology and Oncology

## 2017-03-04 VITALS — BP 144/73 | HR 82 | Temp 99.2°F | Resp 18 | Wt 217.5 lb

## 2017-03-04 DIAGNOSIS — Z7982 Long term (current) use of aspirin: Secondary | ICD-10-CM

## 2017-03-04 DIAGNOSIS — Z79899 Other long term (current) drug therapy: Secondary | ICD-10-CM | POA: Diagnosis not present

## 2017-03-04 DIAGNOSIS — E538 Deficiency of other specified B group vitamins: Secondary | ICD-10-CM | POA: Diagnosis not present

## 2017-03-04 DIAGNOSIS — C9000 Multiple myeloma not having achieved remission: Secondary | ICD-10-CM | POA: Diagnosis not present

## 2017-03-04 DIAGNOSIS — D539 Nutritional anemia, unspecified: Secondary | ICD-10-CM

## 2017-03-04 DIAGNOSIS — I10 Essential (primary) hypertension: Secondary | ICD-10-CM | POA: Diagnosis not present

## 2017-03-04 DIAGNOSIS — Z87891 Personal history of nicotine dependence: Secondary | ICD-10-CM

## 2017-03-04 DIAGNOSIS — R011 Cardiac murmur, unspecified: Secondary | ICD-10-CM | POA: Diagnosis not present

## 2017-03-04 DIAGNOSIS — Z8619 Personal history of other infectious and parasitic diseases: Secondary | ICD-10-CM

## 2017-03-04 DIAGNOSIS — C9141 Hairy cell leukemia, in remission: Secondary | ICD-10-CM

## 2017-03-04 DIAGNOSIS — E785 Hyperlipidemia, unspecified: Secondary | ICD-10-CM

## 2017-03-04 DIAGNOSIS — E11319 Type 2 diabetes mellitus with unspecified diabetic retinopathy without macular edema: Secondary | ICD-10-CM | POA: Diagnosis not present

## 2017-03-04 DIAGNOSIS — Z86010 Personal history of colonic polyps: Secondary | ICD-10-CM

## 2017-03-04 DIAGNOSIS — Z85828 Personal history of other malignant neoplasm of skin: Secondary | ICD-10-CM | POA: Diagnosis not present

## 2017-03-04 DIAGNOSIS — Z7689 Persons encountering health services in other specified circumstances: Secondary | ICD-10-CM

## 2017-03-04 DIAGNOSIS — N400 Enlarged prostate without lower urinary tract symptoms: Secondary | ICD-10-CM | POA: Diagnosis not present

## 2017-03-04 DIAGNOSIS — D4620 Refractory anemia with excess of blasts, unspecified: Secondary | ICD-10-CM

## 2017-03-04 DIAGNOSIS — R5383 Other fatigue: Secondary | ICD-10-CM | POA: Diagnosis not present

## 2017-03-04 DIAGNOSIS — Z794 Long term (current) use of insulin: Secondary | ICD-10-CM | POA: Diagnosis not present

## 2017-03-04 DIAGNOSIS — K219 Gastro-esophageal reflux disease without esophagitis: Secondary | ICD-10-CM | POA: Diagnosis not present

## 2017-03-04 DIAGNOSIS — D46Z Other myelodysplastic syndromes: Secondary | ICD-10-CM

## 2017-03-04 DIAGNOSIS — D649 Anemia, unspecified: Secondary | ICD-10-CM

## 2017-03-04 DIAGNOSIS — D701 Agranulocytosis secondary to cancer chemotherapy: Secondary | ICD-10-CM

## 2017-03-04 DIAGNOSIS — T451X5A Adverse effect of antineoplastic and immunosuppressive drugs, initial encounter: Secondary | ICD-10-CM

## 2017-03-04 DIAGNOSIS — Z8701 Personal history of pneumonia (recurrent): Secondary | ICD-10-CM

## 2017-03-04 LAB — CBC WITH DIFFERENTIAL/PLATELET
Basophils Absolute: 0.1 10*3/uL (ref 0–0.1)
Basophils Relative: 4 %
Eosinophils Absolute: 0.1 10*3/uL (ref 0–0.7)
Eosinophils Relative: 7 %
HCT: 28.5 % — ABNORMAL LOW (ref 40.0–52.0)
Hemoglobin: 10 g/dL — ABNORMAL LOW (ref 13.0–18.0)
Lymphocytes Relative: 31 %
Lymphs Abs: 0.5 10*3/uL — ABNORMAL LOW (ref 1.0–3.6)
MCH: 36.5 pg — ABNORMAL HIGH (ref 26.0–34.0)
MCHC: 35 g/dL (ref 32.0–36.0)
MCV: 104.4 fL — ABNORMAL HIGH (ref 80.0–100.0)
Monocytes Absolute: 0.1 10*3/uL — ABNORMAL LOW (ref 0.2–1.0)
Monocytes Relative: 7 %
Neutro Abs: 0.8 10*3/uL — ABNORMAL LOW (ref 1.4–6.5)
Neutrophils Relative %: 51 %
Platelets: 71 10*3/uL — ABNORMAL LOW (ref 150–440)
RBC: 2.73 MIL/uL — ABNORMAL LOW (ref 4.40–5.90)
RDW: 19.3 % — ABNORMAL HIGH (ref 11.5–14.5)
WBC: 1.5 10*3/uL — ABNORMAL LOW (ref 3.8–10.6)

## 2017-03-04 LAB — MAGNESIUM: Magnesium: 2 mg/dL (ref 1.7–2.4)

## 2017-03-04 LAB — COMPREHENSIVE METABOLIC PANEL
ALT: 18 U/L (ref 17–63)
AST: 18 U/L (ref 15–41)
Albumin: 3.7 g/dL (ref 3.5–5.0)
Alkaline Phosphatase: 65 U/L (ref 38–126)
Anion gap: 5 (ref 5–15)
BUN: 26 mg/dL — ABNORMAL HIGH (ref 6–20)
CO2: 24 mmol/L (ref 22–32)
Calcium: 9.4 mg/dL (ref 8.9–10.3)
Chloride: 106 mmol/L (ref 101–111)
Creatinine, Ser: 1.44 mg/dL — ABNORMAL HIGH (ref 0.61–1.24)
GFR calc Af Amer: 51 mL/min — ABNORMAL LOW (ref >60)
GFR calc non Af Amer: 44 mL/min — ABNORMAL LOW (ref >60)
Glucose, Bld: 288 mg/dL — ABNORMAL HIGH (ref 65–99)
Potassium: 4.2 mmol/L (ref 3.5–5.1)
Sodium: 135 mmol/L (ref 135–145)
Total Bilirubin: 0.7 mg/dL (ref 0.3–1.2)
Total Protein: 6.9 g/dL (ref 6.5–8.1)

## 2017-03-04 MED ORDER — TBO-FILGRASTIM 480 MCG/0.8ML ~~LOC~~ SOSY
480.0000 ug | PREFILLED_SYRINGE | Freq: Once | SUBCUTANEOUS | Status: AC
  Administered 2017-03-04: 480 ug via SUBCUTANEOUS
  Filled 2017-03-04: qty 0.8

## 2017-03-04 MED ORDER — FILGRASTIM 480 MCG/0.8ML IJ SOSY: 480.0000 ug | PREFILLED_SYRINGE | Freq: Once | INTRAMUSCULAR | Status: DC

## 2017-03-04 NOTE — Progress Notes (Signed)
[facility] day:  03/04/2017   Chief Complaint: Alex Smith is a 82 y.o. male with a myelodysplastic marrow, smoldering myeloma, and a history of hairy cell leukemia who is seen for 1 month assessment.  HPI:  The patient was last seen in the medical oncology clinic on 01/21/2017.  At that time, he was a little fatigued.  Exam was stable.  Counts included a hematocrit 28.3, hemoglobin of 9.8, platelets 83,000 and ANC 1600.  He continued Revlimid 3 weeks on/1 week off.  He received his weekly GCSF.  Hemoglobin has ranged between 8.2 and 10.2.  ANC has ranged between 800 - 1600.  Platelet count has ranged between 57,000 and 83,000.    He continued Revlimid 5 mg a day (3 weeks on and 1 week off).  He continued GCSF 480 mcg a week if his ANC was < 1500 (last received 02/26/2017).  He received 1 unit of PRBCs on 02/27/2017 for symptomatic anemia.  During the interim, he voices no complaints. He notes a skin cancer recently taken off his nose about 2 weeks ago. He has been off Revlimid for 1 1/2 to 2 weeks.   Past Medical History:  Diagnosis Date  . Anemia   . B12 deficiency   . Basal cell carcinoma of face   . BPH (benign prostatic hypertrophy)    followed by urology, discharged (Dr. Bernardo Heater)  . CAP (community acquired pneumonia) 02/15/2015  . Colon polyps   . Diverticulosis   . GERD (gastroesophageal reflux disease)   . 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 History of pneumonia 2000's   "once" (07/07/2012)  . History of shingles   . HLD (hyperlipidemia)   . Hypertension   . Pneumonia   . Shortness of breath dyspnea   . Systolic murmur 25/12/3974  . Type 2 diabetes, controlled, with retinopathy ([place])     Past Surgical History:  Procedure Laterality Date  . [place] VITRECTOMY WITH 20 GAUGE MVR PORT FOR MACULAR HOLE  07/07/2012   Procedure: 25 GAUGE PARS PLANA  VITRECTOMY WITH 20 GAUGE MVR PORT FOR MACULAR HOLE;  Surgeon: Hayden Pedro, MD;  Location: [facility];  Service: Ophthalmology;  Laterality: Left;  . BONE MARROW BIOPSY  2016  . CARDIOVASCULAR STRESS TEST  2013   treadmill - no evidence ischemia, EF 61%  . CATARACT EXTRACTION W/ INTRAOCULAR LENS  IMPLANT, BILATERAL  ~ 2010  . COLONOSCOPY  2014   [place] WNL no rpt needed, h/o polyps  . EYE SURGERY Left 06/2012   laser surgery  . GAS INSERTION  07/07/2012   Procedure: INSERTION OF GAS;  Surgeon: Hayden Pedro, MD;  Location: [facility];  Service: Ophthalmology;  Laterality: Left;  [id number]  . PERIPHERAL VASCULAR CATHETERIZATION N/A 02/23/2015   Procedure: Glori Luis Cath Insertion;  Surgeon: Algernon Huxley, MD;  Location: [facility];  Service: Cardiovascular;  Laterality: N/A;  . SERUM PATCH  07/07/2012   Procedure: SERUM PATCH;  Surgeon: Hayden Pedro, MD;  Location: [facility];  Service: Ophthalmology;  Laterality: Left;  . SKIN CANCER EXCISION     "all over my face" (07/07/2012)    Family History  Problem Relation Age of Onset  . Dementia Mother   . Heart failure Father 67  . Cancer Sister        breast  . Diabetes Paternal Uncle   .  Diabetes Paternal Aunt   . CAD Brother 31       MI  . Stroke Neg Hx     Social History:  reports that he quit smoking about 48 years ago. His smoking use included Cigarettes. He has a 50.00 pack-year smoking history. He has never used smokeless tobacco. He reports that he drinks alcohol. He reports that he does not use drugs.  He works at [employer] 20-32 hours/week.  The patient 's wife has had 3 hip operations.  She underwent hip replacement 03/06/2016.  His wife is now in a nursing home.  He states that they either eat out or bring food in.  His best contact number is his cell: [phone].  He is alone today.  Allergies:  Allergies  Allergen Reactions  . Rituximab Rash    Chest tightness  . Blood-Group Specific Substance Other (See Comments)    Had a  post transfusion reaction of red blood cells; NOW REQUIRES WASHED BLOOD CELLS  . Primaxin [Imipenem] Other (See Comments)    Possible allergy  . Sulfa Antibiotics Itching and Rash  Possible allergy to Zoloft, allopurinol, and a chemotherapy medication.  He tolerates Dapsone.  Current Medications: Current Outpatient Prescriptions  Medication Sig Dispense Refill  . acyclovir (ZOVIRAX) 400 MG tablet TAKE TWO TABLETS TWICE DAILY 120 tablet 6  . aspirin EC 81 MG tablet Take 81 mg by mouth daily.    Marland Kitchen dexamethasone (DECADRON) 4 MG tablet TAKE 5 TABLETS ONCE A WEEK 20 tablet 2  . glucose blood (ACCU-CHEK AVIVA PLUS) test strip Use to check sugar twice daily Dx:E11.9 200 each 3  . HUMULIN R 100 UNIT/ML injection INJECT 10 UNITS SUBCUTANEOUSLY TWICE DAILY BEFORE MEALS 10 mL 6  . hydrochlorothiazide (HYDRODIURIL) 25 MG tablet TAKE ONE TABLET EVERY DAY 30 tablet 5  . insulin NPH Human (HUMULIN N) 100 UNIT/ML injection Inject 0.39 mLs (39 Units total) into the skin 2 (two) times daily before a meal. 20 mL 6  . lenalidomide (REVLIMID) 5 MG capsule 5 mg a day for 21 days then 7 days off 21 capsule 0  . lidocaine-prilocaine (EMLA) cream Apply 1 application topically as needed. 30 g 3  . lisinopril (PRINIVIL,ZESTRIL) 20 MG tablet TAKE 1 TABLET BY MOUTH TWICE DAILY 60 tablet 6  . metFORMIN (GLUCOPHAGE) 1000 MG tablet TAKE ONE TABLET BY MOUTH TWICE DAILY AS DIRECTED 180 tablet 3  . MICROLET LANCETS MISC USE AS DIRECTED 200 each 3  . pravastatin (PRAVACHOL) 20 MG tablet TAKE ONE TABLET AT BEDTIME 30 tablet 6  . ranitidine (ZANTAC) 150 MG tablet TAKE 1 TABLET BY MOUTH TWICE DAILY AS NEEDED 180 tablet 3  . tamsulosin (FLOMAX) 0.4 MG CAPS capsule Take 2 capsules (0.8 mg total) by mouth daily. 60 capsule 1  . temazepam (RESTORIL) 7.5 MG capsule Take 7.5 mg by mouth once.     . traZODone (DESYREL) 50 MG tablet TAKE 1/2 TO 1 TABLET BY MOUTH AT BEDTIMEAS NEEDED FOR SLEEP 30 tablet 0  . vitamin E 400 UNIT capsule  Take 200 Units by mouth daily.      Current Facility-Administered Medications  Medication Dose Route Frequency Provider Last Rate Last Dose  . Tbo-Filgrastim (GRANIX) injection 480 mcg  480 mcg Subcutaneous Once Lequita Asal, MD       Facility-Administered Medications Ordered in Other Visits  Medication Dose Route Frequency Provider Last Rate Last Dose  . heparin lock flush 100 unit/mL  500 Units Intravenous Once Corcoran, Melissa C,  MD      . sodium chloride 0.9 % injection 10 mL  10 mL Intravenous PRN Lequita Asal, MD   10 mL at 03/03/15 0903  . sodium chloride 0.9 % injection 10 mL  10 mL Intracatheter PRN Nolon Stalls C, MD   10 mL at 03/10/15 1410  . sodium chloride flush (NS) 0.9 % injection 10 mL  10 mL Intravenous PRN Lequita Asal, MD   10 mL at 01/14/17 1139   Review of Systems:  GENERAL:  Feels "ok".  No increase in energy.  No fevers or sweats.  Weight up 3 pounds. PERFORMANCE STATUS (ECOG):  1 HEENT: No visual changes, runny nose, sore throat. Pulmonary:  Chronic shortness of breath on exertion.  No cough.  No hemoptysis. Cardiac:  No chest pain, palpitations, orthopnea, or PND. GI:  Appetite good.  No nausea, vomiting, diarrhea, constipation, melena or hematochezia.  GU:  No urgency, frequency, dysuria, or hematuria.  On Flomax. Musculoskeletal:  No back pain.  No joint pain.  No muscle tenderness. Extremities:  No pain or swelling. Skin:  Skin cancer s/p removal (see HPI).  No rashes or skin changes. Neuro:  No headache, numbness or weakness, balance or coordination issues. Endocrine:  Diabetes.  No thyroid issues, hot flashes or night sweats. Psych:  No mood changes, depression or anxiety.  Pain:  No focal pain. Review of systems:  All other systems reviewed and found to be negative.   Physical Exam: Blood pressure (!) 144/73, pulse 82, temperature 99.2 F (37.3 C), temperature source Tympanic, resp. rate 18, weight 217 lb 8 oz (98.7 kg).   GENERAL:  Well developed, well nourished, gentleman sitting comfortably in the exam room in no acute distress. MENTAL STATUS:  Alert and oriented to person, place and time. HEAD:  Pearline Cables white hair.  Normocephalic, atraumatic, face symmetric, no Cushingoid features. EYES:  Glasses. Blue eyes.  Pupils equal round and reactive to light and accomodation.  No conjunctivitis or scleral icterus. ENT:  Oropharynx clear without lesion.  Tongue normal. Mucous membranes moist.  RESPIRATORY:  Clear to auscultation without rales, wheezes or rhonchi. CARDIOVASCULAR:  Regular rate and rhythm without murmur, rub or gallop. ABDOMEN:  Soft, non-tender, with active bowel sounds, and no hepatosplenomegaly.  No masses. SKIN:  No rashes or ulcers. EXTREMITIES: No edema, no skin discoloration or tenderness.  No palpable cords. LYMPH NODES: No palpable cervical, supraclavicular, axillary or inguinal adenopathy  NEUROLOGICAL: Unremarkable. PSYCH:  Appropriate.    Appointment on 03/04/2017  Component Date Value Ref Range Status  . Sodium 03/04/2017 135  135 - 145 mmol/L Final  . Potassium 03/04/2017 4.2  3.5 - 5.1 mmol/L Final  . Chloride 03/04/2017 106  101 - 111 mmol/L Final  . CO2 03/04/2017 24  22 - 32 mmol/L Final  . Glucose, Bld 03/04/2017 288* 65 - 99 mg/dL Final  . BUN 03/04/2017 26* 6 - 20 mg/dL Final  . Creatinine, Ser 03/04/2017 1.44* 0.61 - 1.24 mg/dL Final  . Calcium 03/04/2017 9.4  8.9 - 10.3 mg/dL Final  . Total Protein 03/04/2017 6.9  6.5 - 8.1 g/dL Final  . Albumin 03/04/2017 3.7  3.5 - 5.0 g/dL Final  . AST 03/04/2017 18  15 - 41 U/L Final  . ALT 03/04/2017 18  17 - 63 U/L Final  . Alkaline Phosphatase 03/04/2017 65  38 - 126 U/L Final  . Total Bilirubin 03/04/2017 0.7  0.3 - 1.2 mg/dL Final  . GFR calc non Af  Amer 03/04/2017 44* >60 mL/min Final  . GFR calc Af Amer 03/04/2017 51* >60 mL/min Final   Comment: (NOTE) The eGFR has been calculated using the CKD EPI equation. This calculation  has not been validated in all clinical situations. eGFR's persistently <60 mL/min signify possible Chronic Kidney Disease.   . Anion gap 03/04/2017 5  5 - 15 Final  . Magnesium 03/04/2017 2.0  1.7 - 2.4 mg/dL Final  . WBC 03/04/2017 1.5* 3.8 - 10.6 K/uL Final  . RBC 03/04/2017 2.73* 4.40 - 5.90 MIL/uL Final  . Hemoglobin 03/04/2017 10.0* 13.0 - 18.0 g/dL Final  . HCT 03/04/2017 28.5* 40.0 - 52.0 % Final  . MCV 03/04/2017 104.4* 80.0 - 100.0 fL Final  . MCH 03/04/2017 36.5* 26.0 - 34.0 pg Final  . MCHC 03/04/2017 35.0  32.0 - 36.0 g/dL Final  . RDW 03/04/2017 19.3* 11.5 - 14.5 % Final  . Platelets 03/04/2017 71* 150 - 440 K/uL Final  . Neutrophils Relative % 03/04/2017 51  % Final  . Neutro Abs 03/04/2017 0.8* 1.4 - 6.5 K/uL Final  . Lymphocytes Relative 03/04/2017 31  % Final  . Lymphs Abs 03/04/2017 0.5* 1.0 - 3.6 K/uL Final  . Monocytes Relative 03/04/2017 7  % Final  . Monocytes Absolute 03/04/2017 0.1* 0.2 - 1.0 K/uL Final  . Eosinophils Relative 03/04/2017 7  % Final  . Eosinophils Absolute 03/04/2017 0.1  0 - 0.7 K/uL Final  . Basophils Relative 03/04/2017 4  % Final  . Basophils Absolute 03/04/2017 0.1  0 - 0.1 K/uL Final    Assessment:  Alex Smith is a 82 y.o. 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  He was diagnosed with hairy cell leukemia in 2006.  He received cladribine 11/06-11/05/2005.  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   Bone marrow aspirate and biopsy on 02/07/2015 revealed a extensive marrow involvement by recurrent hairy cell leukemia (approximately 80% of cells in the core). There was a monoclonal plasma cell infiltrate (approximately 10%), compatible with a plasma cell neoplasm. Marrow was hypercellular (40-50%) with markedly diminished residual trilineage hematopoiesis and mild multilineage dyspoiesis.  Peripheral smear revealed leukopenia (WBC 1900) with 2% atypical lymphoid cells compatible with hairy cell  leukemia. There was moderate anemia with anisopoikilocytosis. 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  FISH studies for MDS were negative.  Cytogenetics were normal (46, XY).  He has an unclear allergy history.  He may be allergic to Primaxin, Zoloft, allopurinol, and a chemotherapy medication.  Reaction occurred in 06/2005.  He is allergic to sulfa.  He is not allergic to dapsone.  He requires washed RBCs.  He is on prophylactic acyclovir.  Dapsone and voriconazole were discontinued.  He has a history of shingles prior to prophylactic acyclovir.  He is 17 months status post cladribine (began 03/03/2015).  His counts plateaued on 07/25/2015 and have subsequently decreased.  Labs on 07/25/2015 revealed a normal B12 (894), folate (60.5), and TSH (2.562).  Ferritin was 50 on 04/26/2016.  Labs on 08/22/2016 revealed a normal B12, folate, and TSH.  He is on oral B12.  Bone marrow on 11/03/2015 revealed persistent plasma cell neoplasm, now with mildy atypical monoclonal plasma cells estimated at 20-30%.   There was no morphologic evidence of residual hairy cell leukemia.  There was a minute population (0.03%) with hairy cell immunophenotype detected by flow.  Marrow was normocellular for age with relative erythroid  hyperplasia, relative myeloid hypoplasia, mild dyspoiesis, adequate megakaryocytes and no increased blasts.  There was no significant increase in marrow reticulin fibers.  Iron was present.  FISH studies for MDS were negative.  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  Cytogenetics were normal (46, XY).  Bone marrow on 03/13/2016 was normocellular to marginally hypercellular for age (20-40%) with relative erythroid hyperplasia and mild dyserythropoiesis, decreased granulopoiesis and adequate megakaryocytes.  There was a persistent 15-20% plasma cell neoplasm.  There was no overt increase  in reticulin fibrosis.  Storage iron was present.  Flow cytometry revealed abnormal/monocytic plasma cell population (3% sample), consistent with a plasma cell neoplastic process.  Cytogenetics were normal (46, XY).  FISH studies for myeloma revealed a CCND1/IGH translocation, t(11;14) in 53% of nuclei.  SPEP has been followed: 1.0 on 02/03/2015, 1.4 gm/dL on 07/25/2015, 1.3 gm/dL on 11/17/2015, 1.2 gm/dL on 06/28/2016 and 1.0 gm/dL on 09/20/2016.    Kappa free light chains have been followed: 47.63 (ratio 3.12) on 02/03/2015, 54.9 (ratio 3.14) on 02/07/2015, 46.23 (ratio 3.68) on 07/25/2015, and 50.5 (ratio 4.82) on 11/17/2015.  Beta2-microglobulin was 2.5 on 11/17/2015.  24 hour urine on 11/21/2015 revealed 234.8 mg protein in 24 hours (no monoclonal protein).  Bone survey on 11/24/2015 revealed no lytic lesions.  Epo level was 329.6 on 03/29/2016.  He began a trial of Procrit.  He received 10,000 units of Procrit weekly (04/26/2016 - 06/14/2016).  Hemoglobin ranged between 8.4 and 8.9 without trend.    He is s/p 1 cycle ofRevlimid (10 mg) and Decadron(06/29/2016 - 07/19/2016). Cycle #1 was complicated by anemia and neutropenia. He has received 4 units of PRBCs(1 unit:07/22/2016 and 2 units: 08/07/2016, and 1 unit on 01/14/2017). He received 6 days ofGCSF.  He is s/p 4 cycles of Revlimid (14m) and Decadron (began 09/14/2016).  He began weekly GCSF on 11/05/2016.  He receives GCSF if his ANC is <= 1500.  He is on aspirin 81 mg a day prophylaxis.  PET scan on 03/14/2016 revealed no evidence of hypermetabolic lymphadenopathy or focal skeletal lesions to suggest marrow or cortical bone involvement.  Symptomatically, he is fatigued.  Exam is stable.  Counts include a hematocrit 28.5, hemoglobin of 10.0, platelets 71,000, and WBC 1500 with ANC 800.  Plan: 1.  Labs today:  CBC with diff, CMP, Mg. 2.  Patient to follow-up with Dr. REvelene Croon 3.  Hold Revlimid.  Anticipate reinstitution of  Revlimid with count recovery. 4.  GCSF 480 mcg today. 5.  RTC weekly x 3 for labs (CBC with diff) +/- Neupogen. 6.  RTC in 4 weeks for MD assessment, labs (CBC with diff, CMP, Mg) and +/- Neupogen.   MLequita Asal MD  03/04/2017, 11:36 AM

## 2017-03-04 NOTE — Progress Notes (Signed)
Patient offers no complaints today. 

## 2017-03-05 ENCOUNTER — Telehealth: Payer: Self-pay | Admitting: Family Medicine

## 2017-03-05 NOTE — Telephone Encounter (Signed)
Received message about persistent hyperglycemia while on decadron (20mg  once weekly PO) for revlimid treatment.  plz call patient -  Regimen should be: Humulin R 10u BID with meals, Humulin N NPH 39u BID, metformin 1000mg  bid with addition of SSI humulin R insulin to mealtime insulin BID ACas follows on day of and day after decadron: no extra insulin if cbg <150. 1 unit if cbg 150-200. 2 units if 200-250. 3 units if cbg 250-300. 4 units if cbg 300-350, 5 units of >350. SSI will only be for Saturday and Sunday.   Is he following this regimen? How are sugars running days after decadron vs other days of week? We may need to increase NPH on decadron days as well.   Lab Results  Component Value Date   HGBA1C 7.0 (H) 12/11/2016  fructosamine 286 (05/2016) - A1c equivalent of 7%

## 2017-03-07 NOTE — Telephone Encounter (Signed)
I spoke with patient regarding clarification around his diabetes medication regimen.  Patient states that he is no longer taking the Revlimid treatments and subsequently not taking the decadron.  Currently patient confirms taking the following only: Humulin R 10 u bid with meals Humulin N NPH 39 u bid Metformin 1000mg  bid   He states his blood sugars are "okay" - running in the 140's fasting in ams.  He has an appointment to see Dr. Darnell Level on 03/14/17 for follow up.

## 2017-03-10 ENCOUNTER — Other Ambulatory Visit: Payer: Self-pay | Admitting: Family Medicine

## 2017-03-11 ENCOUNTER — Inpatient Hospital Stay: Payer: Medicare Other

## 2017-03-11 ENCOUNTER — Other Ambulatory Visit: Payer: Self-pay | Admitting: Family Medicine

## 2017-03-11 DIAGNOSIS — D649 Anemia, unspecified: Secondary | ICD-10-CM | POA: Diagnosis not present

## 2017-03-11 DIAGNOSIS — Z79899 Other long term (current) drug therapy: Secondary | ICD-10-CM | POA: Diagnosis not present

## 2017-03-11 DIAGNOSIS — T451X5A Adverse effect of antineoplastic and immunosuppressive drugs, initial encounter: Secondary | ICD-10-CM

## 2017-03-11 DIAGNOSIS — Z86010 Personal history of colonic polyps: Secondary | ICD-10-CM | POA: Diagnosis not present

## 2017-03-11 DIAGNOSIS — D701 Agranulocytosis secondary to cancer chemotherapy: Secondary | ICD-10-CM

## 2017-03-11 DIAGNOSIS — Z87891 Personal history of nicotine dependence: Secondary | ICD-10-CM | POA: Diagnosis not present

## 2017-03-11 DIAGNOSIS — Z7982 Long term (current) use of aspirin: Secondary | ICD-10-CM | POA: Diagnosis not present

## 2017-03-11 DIAGNOSIS — R5383 Other fatigue: Secondary | ICD-10-CM | POA: Diagnosis not present

## 2017-03-11 DIAGNOSIS — Z8619 Personal history of other infectious and parasitic diseases: Secondary | ICD-10-CM | POA: Diagnosis not present

## 2017-03-11 DIAGNOSIS — Z794 Long term (current) use of insulin: Secondary | ICD-10-CM | POA: Diagnosis not present

## 2017-03-11 DIAGNOSIS — E11319 Type 2 diabetes mellitus with unspecified diabetic retinopathy without macular edema: Secondary | ICD-10-CM | POA: Diagnosis not present

## 2017-03-11 DIAGNOSIS — C9141 Hairy cell leukemia, in remission: Secondary | ICD-10-CM

## 2017-03-11 DIAGNOSIS — I10 Essential (primary) hypertension: Secondary | ICD-10-CM | POA: Diagnosis not present

## 2017-03-11 DIAGNOSIS — D46Z Other myelodysplastic syndromes: Secondary | ICD-10-CM

## 2017-03-11 DIAGNOSIS — K219 Gastro-esophageal reflux disease without esophagitis: Secondary | ICD-10-CM | POA: Diagnosis not present

## 2017-03-11 DIAGNOSIS — E785 Hyperlipidemia, unspecified: Secondary | ICD-10-CM | POA: Diagnosis not present

## 2017-03-11 DIAGNOSIS — D4620 Refractory anemia with excess of blasts, unspecified: Secondary | ICD-10-CM | POA: Diagnosis not present

## 2017-03-11 DIAGNOSIS — E538 Deficiency of other specified B group vitamins: Secondary | ICD-10-CM

## 2017-03-11 DIAGNOSIS — C9000 Multiple myeloma not having achieved remission: Secondary | ICD-10-CM

## 2017-03-11 DIAGNOSIS — R011 Cardiac murmur, unspecified: Secondary | ICD-10-CM | POA: Diagnosis not present

## 2017-03-11 DIAGNOSIS — Z7689 Persons encountering health services in other specified circumstances: Secondary | ICD-10-CM | POA: Diagnosis not present

## 2017-03-11 DIAGNOSIS — Z85828 Personal history of other malignant neoplasm of skin: Secondary | ICD-10-CM | POA: Diagnosis not present

## 2017-03-11 DIAGNOSIS — N400 Enlarged prostate without lower urinary tract symptoms: Secondary | ICD-10-CM | POA: Diagnosis not present

## 2017-03-11 LAB — CBC WITH DIFFERENTIAL/PLATELET
Basophils Absolute: 0 10*3/uL (ref 0–0.1)
Basophils Relative: 2 %
Eosinophils Absolute: 0.2 10*3/uL (ref 0–0.7)
Eosinophils Relative: 7 %
HCT: 27.6 % — ABNORMAL LOW (ref 40.0–52.0)
Hemoglobin: 9.7 g/dL — ABNORMAL LOW (ref 13.0–18.0)
Lymphocytes Relative: 29 %
Lymphs Abs: 0.7 10*3/uL — ABNORMAL LOW (ref 1.0–3.6)
MCH: 36.5 pg — ABNORMAL HIGH (ref 26.0–34.0)
MCHC: 35.2 g/dL (ref 32.0–36.0)
MCV: 103.8 fL — ABNORMAL HIGH (ref 80.0–100.0)
Monocytes Absolute: 0.2 10*3/uL (ref 0.2–1.0)
Monocytes Relative: 9 %
Neutro Abs: 1.2 10*3/uL — ABNORMAL LOW (ref 1.4–6.5)
Neutrophils Relative %: 53 %
Platelets: 72 10*3/uL — ABNORMAL LOW (ref 150–440)
RBC: 2.65 MIL/uL — ABNORMAL LOW (ref 4.40–5.90)
RDW: 18.6 % — ABNORMAL HIGH (ref 11.5–14.5)
WBC: 2.2 10*3/uL — ABNORMAL LOW (ref 3.8–10.6)

## 2017-03-11 MED ORDER — TBO-FILGRASTIM 480 MCG/0.8ML ~~LOC~~ SOSY
480.0000 ug | PREFILLED_SYRINGE | Freq: Once | SUBCUTANEOUS | Status: AC
  Administered 2017-03-11: 480 ug via SUBCUTANEOUS
  Filled 2017-03-11: qty 0.8

## 2017-03-11 MED ORDER — FILGRASTIM 480 MCG/0.8ML IJ SOSY: 480.0000 ug | PREFILLED_SYRINGE | Freq: Once | INTRAMUSCULAR | Status: DC

## 2017-03-14 ENCOUNTER — Ambulatory Visit (INDEPENDENT_AMBULATORY_CARE_PROVIDER_SITE_OTHER): Payer: Medicare Other | Admitting: Family Medicine

## 2017-03-14 ENCOUNTER — Encounter: Payer: Self-pay | Admitting: Family Medicine

## 2017-03-14 VITALS — BP 134/66 | HR 90 | Temp 98.2°F | Wt 218.0 lb

## 2017-03-14 DIAGNOSIS — E785 Hyperlipidemia, unspecified: Secondary | ICD-10-CM

## 2017-03-14 DIAGNOSIS — I10 Essential (primary) hypertension: Secondary | ICD-10-CM | POA: Diagnosis not present

## 2017-03-14 DIAGNOSIS — R011 Cardiac murmur, unspecified: Secondary | ICD-10-CM | POA: Diagnosis not present

## 2017-03-14 DIAGNOSIS — E11319 Type 2 diabetes mellitus with unspecified diabetic retinopathy without macular edema: Secondary | ICD-10-CM | POA: Diagnosis not present

## 2017-03-14 DIAGNOSIS — C9000 Multiple myeloma not having achieved remission: Secondary | ICD-10-CM | POA: Diagnosis not present

## 2017-03-14 DIAGNOSIS — Z794 Long term (current) use of insulin: Secondary | ICD-10-CM | POA: Diagnosis not present

## 2017-03-14 DIAGNOSIS — D4620 Refractory anemia with excess of blasts, unspecified: Secondary | ICD-10-CM

## 2017-03-14 DIAGNOSIS — C9141 Hairy cell leukemia, in remission: Secondary | ICD-10-CM | POA: Diagnosis not present

## 2017-03-14 DIAGNOSIS — Z85828 Personal history of other malignant neoplasm of skin: Secondary | ICD-10-CM

## 2017-03-14 NOTE — Assessment & Plan Note (Signed)
Chronic, trig elevated recently despite pravastatin 20mg . LDL 69.

## 2017-03-14 NOTE — Progress Notes (Addendum)
BP 134/66 (BP Location: Left Arm, Patient Position: Sitting, Cuff Size: Large)   Pulse 90   Temp 98.2 F (36.8 C) (Oral)   Wt 218 lb (98.9 kg)   SpO2 97%   BMI 35.73 kg/m    CC: 3 mo f/u visit Subjective:    Patient ID: Alex Smith, male    DOB: February 23, 1935, 82 y.o.   MRN: [id number]  HPI: Alex Smith is a 82 y.o. male presenting on 03/14/2017 for Diabetes (3 month follow-up. Checks blood sugar at home. Averages 160 fasting)   Known hairy cell leukemia with smoldering stage I MM followed by onc [name]). Gets weekly labs. Currently off revlimid and decadron.   DM - regularly does check sugars fasting once daily, averaging 160s. Compliant with antihyperglycemic regimen which includes: (See below).  Denies low sugars or hypoglycemic symptoms.  Denies paresthesias. Last diabetic eye exam DUE.  Pneumovax: 2006.  Prevnar: 2015. Lab Results  Component Value Date   HGBA1C 7.0 (H) 12/11/2016  fructosamine 286 - A1c equivalent of 7% (05/2016) Diabetic Foot Exam - Simple   Simple Foot Form Diabetic Foot exam was performed with the following findings:  Yes 03/14/2017 12:21 PM  Visual Inspection No deformities, no ulcerations, no other skin breakdown bilaterally:  Yes Sensation Testing Intact to touch and monofilament testing bilaterally:  Yes Pulse Check Posterior Tibialis and Dorsalis pulse intact bilaterally:  Yes Comments      Current regimen: Humulin R 10 u bid with meals Humulin N NPH 39 u bid Metformin 1064m bid   Recent BCC removed from right nasal bridge   Relevant past medical, surgical, family and social history reviewed and updated as indicated. Interim medical history since our last visit reviewed. Allergies and medications reviewed and updated. Outpatient Medications Prior to Visit  Medication Sig Dispense Refill  . acyclovir (ZOVIRAX) 400 MG tablet TAKE TWO TABLETS TWICE DAILY 120 tablet 6  . aspirin EC 81 MG tablet Take 81 mg by mouth daily.    .Marland Kitchenglucose  blood (ACCU-CHEK AVIVA PLUS) test strip Use to check sugar twice daily Dx:E11.9 200 each 3  . HUMULIN R 100 UNIT/ML injection INJECT 10 UNITS SUBCUTANEOUSLY TWICE DAILY BEFORE MEALS 10 mL 6  . hydrochlorothiazide (HYDRODIURIL) 25 MG tablet TAKE ONE TABLET EVERY DAY 30 tablet 5  . insulin NPH Human (HUMULIN N) 100 UNIT/ML injection Inject 0.39 mLs (39 Units total) into the skin 2 (two) times daily before a meal. 20 mL 6  . lidocaine-prilocaine (EMLA) cream Apply 1 application topically as needed. 30 g 3  . lisinopril (PRINIVIL,ZESTRIL) 20 MG tablet TAKE 1 TABLET BY MOUTH TWICE DAILY 60 tablet 6  . metFORMIN (GLUCOPHAGE) 1000 MG tablet TAKE ONE TABLET BY MOUTH TWICE DAILY AS DIRECTED 180 tablet 1  . MICROLET LANCETS MISC USE AS DIRECTED 200 each 3  . pravastatin (PRAVACHOL) 20 MG tablet TAKE ONE TABLET AT BEDTIME 30 tablet 6  . ranitidine (ZANTAC) 150 MG tablet TAKE 1 TABLET BY MOUTH TWICE DAILY AS NEEDED 180 tablet 3  . tamsulosin (FLOMAX) 0.4 MG CAPS capsule TAKE 1 CAPSULE BY MOUTH EVERY DAY 30 capsule 2  . temazepam (RESTORIL) 7.5 MG capsule Take 7.5 mg by mouth once.     . traZODone (DESYREL) 50 MG tablet TAKE 1/2 TO 1 TABLET BY MOUTH AT BEDTIMEAS NEEDED FOR SLEEP 30 tablet 0  . vitamin E 400 UNIT capsule Take 200 Units by mouth daily.     .Marland Kitchendexamethasone (DECADRON) 4 MG tablet  TAKE 5 TABLETS ONCE A WEEK (Patient not taking: Reported on 03/14/2017) 20 tablet 2  . lenalidomide (REVLIMID) 5 MG capsule 5 mg a day for 21 days then 7 days off (Patient not taking: Reported on 03/14/2017) 21 capsule 0   Facility-Administered Medications Prior to Visit  Medication Dose Route Frequency Provider Last Rate Last Dose  . heparin lock flush 100 unit/mL  500 Units Intravenous Once Corcoran, Melissa C, MD      . sodium chloride 0.9 % injection 10 mL  10 mL Intravenous PRN Lequita Asal, MD   10 mL at 03/03/15 0903  . sodium chloride 0.9 % injection 10 mL  10 mL Intracatheter PRN Nolon Stalls C, MD    10 mL at 03/10/15 1410  . Tbo-Filgrastim (GRANIX) injection 480 mcg  480 mcg Subcutaneous Once Lequita Asal, MD         Per HPI unless specifically indicated in ROS section below Review of Systems     Objective:    BP 134/66 (BP Location: Left Arm, Patient Position: Sitting, Cuff Size: Large)   Pulse 90   Temp 98.2 F (36.8 C) (Oral)   Wt 218 lb (98.9 kg)   SpO2 97%   BMI 35.73 kg/m   Wt Readings from Last 3 Encounters:  03/14/17 218 lb (98.9 kg)  03/04/17 217 lb 8 oz (98.7 kg)  01/21/17 214 lb 7 oz (97.3 kg)    Physical Exam  Constitutional: He appears well-developed and well-nourished. No distress.  HENT:  Head: Normocephalic and atraumatic.  Right Ear: External ear normal.  Left Ear: External ear normal.  Nose: Nose normal.  Mouth/Throat: Oropharynx is clear and moist. No oropharyngeal exudate.  Eyes: Pupils are equal, round, and reactive to light. Conjunctivae and EOM are normal. No scleral icterus.  Neck: Normal range of motion. Neck supple.  Cardiovascular: Normal rate, regular rhythm and intact distal pulses.   Murmur (3/6 SEM best at USB) heard. Pulmonary/Chest: Effort normal and breath sounds normal. No respiratory distress. He has no wheezes. He has no rales.  Musculoskeletal: He exhibits edema (1+ pitting).  See HPI for foot exam if done  Lymphadenopathy:    He has no cervical adenopathy.  Skin: Skin is warm and dry. No rash noted.  Psychiatric: He has a normal mood and affect.  Nursing note and vitals reviewed.  Lab Results  Component Value Date   CHOL 138 12/11/2016   HDL 41.90 12/11/2016   LDLCALC 58.3 02/18/2013   LDLDIRECT 69.0 12/11/2016   TRIG 248.0 (H) 12/11/2016   CHOLHDL 3 12/11/2016       Assessment & Plan:   Problem List Items Addressed This Visit    Dyslipidemia    Chronic, trig elevated recently despite pravastatin 17m. LDL 69.       Essential hypertension    Chronic, stable. Continue current regimen.       Hairy cell  leukemia, in remission ([facility]   History of nonmelanoma skin cancer   Multiple [facility]oma not having achieved remission ([facility]    Appreciate onc care. I asked patient to let uKoreaknow if he restarts decadron/revlimid.      Myelodysplastic syndrome, low grade ([facility])   Systolic murmur    Chronic, stable. Will discuss baseline echo next visit.       Type 2 diabetes, controlled, with retinopathy ([facility]    Chronic, stable period. When off steroid, seems well controlled as evidenced by recent A1c's - will update. Will see if  we can consolidate blood draws - I will order labs to see if can be done at upcoming cancer center labs. If unable to be done, advised pt would need to return here for labs.       Relevant Orders   Hemoglobin A1c   Fructosamine       Follow up plan: Return in about 3 months (around 06/14/2017) for follow up visit.  Ria Bush, MD

## 2017-03-14 NOTE — Assessment & Plan Note (Signed)
Chronic, stable. Will discuss baseline echo next visit.

## 2017-03-14 NOTE — Assessment & Plan Note (Signed)
Chronic, stable period. When off steroid, seems well controlled as evidenced by recent A1c's - will update. Will see if we can consolidate blood draws - I will order labs to see if can be done at upcoming cancer center labs. If unable to be done, advised pt would need to return here for labs.

## 2017-03-14 NOTE — Assessment & Plan Note (Signed)
Appreciate onc care. I asked patient to let us know if he restarts decadron/revlimid.

## 2017-03-14 NOTE — Assessment & Plan Note (Signed)
Chronic, stable. Continue current regimen. 

## 2017-03-14 NOTE — Patient Instructions (Addendum)
I've ordered labs in the system. I'd like to get them drawn at cancer center next week when you get your routine labs. If they are unable to draw there, return here for lab visit at your convenience (non-fasting).  Let me know if we restart revlimid and decadron for further adjustment of your insulin dosing.  Keep sugar log and bring in next visit.  Return in 3-4 months f/u visit

## 2017-03-18 ENCOUNTER — Other Ambulatory Visit: Payer: Self-pay

## 2017-03-18 ENCOUNTER — Inpatient Hospital Stay: Payer: Medicare Other

## 2017-03-18 DIAGNOSIS — E538 Deficiency of other specified B group vitamins: Secondary | ICD-10-CM | POA: Diagnosis not present

## 2017-03-18 DIAGNOSIS — Z79899 Other long term (current) drug therapy: Secondary | ICD-10-CM | POA: Diagnosis not present

## 2017-03-18 DIAGNOSIS — R011 Cardiac murmur, unspecified: Secondary | ICD-10-CM | POA: Diagnosis not present

## 2017-03-18 DIAGNOSIS — Z86010 Personal history of colonic polyps: Secondary | ICD-10-CM | POA: Diagnosis not present

## 2017-03-18 DIAGNOSIS — Z8619 Personal history of other infectious and parasitic diseases: Secondary | ICD-10-CM | POA: Diagnosis not present

## 2017-03-18 DIAGNOSIS — R5383 Other fatigue: Secondary | ICD-10-CM | POA: Diagnosis not present

## 2017-03-18 DIAGNOSIS — Z7689 Persons encountering health services in other specified circumstances: Secondary | ICD-10-CM | POA: Diagnosis not present

## 2017-03-18 DIAGNOSIS — C9141 Hairy cell leukemia, in remission: Secondary | ICD-10-CM | POA: Diagnosis not present

## 2017-03-18 DIAGNOSIS — T451X5A Adverse effect of antineoplastic and immunosuppressive drugs, initial encounter: Secondary | ICD-10-CM

## 2017-03-18 DIAGNOSIS — D46Z Other myelodysplastic syndromes: Secondary | ICD-10-CM

## 2017-03-18 DIAGNOSIS — E11319 Type 2 diabetes mellitus with unspecified diabetic retinopathy without macular edema: Secondary | ICD-10-CM | POA: Diagnosis not present

## 2017-03-18 DIAGNOSIS — C9000 Multiple myeloma not having achieved remission: Secondary | ICD-10-CM

## 2017-03-18 DIAGNOSIS — Z85828 Personal history of other malignant neoplasm of skin: Secondary | ICD-10-CM | POA: Diagnosis not present

## 2017-03-18 DIAGNOSIS — Z794 Long term (current) use of insulin: Secondary | ICD-10-CM | POA: Diagnosis not present

## 2017-03-18 DIAGNOSIS — D649 Anemia, unspecified: Secondary | ICD-10-CM | POA: Diagnosis not present

## 2017-03-18 DIAGNOSIS — D4620 Refractory anemia with excess of blasts, unspecified: Secondary | ICD-10-CM

## 2017-03-18 DIAGNOSIS — Z87891 Personal history of nicotine dependence: Secondary | ICD-10-CM | POA: Diagnosis not present

## 2017-03-18 DIAGNOSIS — K219 Gastro-esophageal reflux disease without esophagitis: Secondary | ICD-10-CM | POA: Diagnosis not present

## 2017-03-18 DIAGNOSIS — E785 Hyperlipidemia, unspecified: Secondary | ICD-10-CM | POA: Diagnosis not present

## 2017-03-18 DIAGNOSIS — Z7982 Long term (current) use of aspirin: Secondary | ICD-10-CM | POA: Diagnosis not present

## 2017-03-18 DIAGNOSIS — N400 Enlarged prostate without lower urinary tract symptoms: Secondary | ICD-10-CM | POA: Diagnosis not present

## 2017-03-18 DIAGNOSIS — I10 Essential (primary) hypertension: Secondary | ICD-10-CM | POA: Diagnosis not present

## 2017-03-18 DIAGNOSIS — D701 Agranulocytosis secondary to cancer chemotherapy: Secondary | ICD-10-CM

## 2017-03-18 LAB — CBC WITH DIFFERENTIAL/PLATELET
Basophils Absolute: 0 10*3/uL (ref 0–0.1)
Basophils Relative: 2 %
Eosinophils Absolute: 0.2 10*3/uL (ref 0–0.7)
Eosinophils Relative: 8 %
HCT: 27.3 % — ABNORMAL LOW (ref 40.0–52.0)
Hemoglobin: 9.6 g/dL — ABNORMAL LOW (ref 13.0–18.0)
Lymphocytes Relative: 28 %
Lymphs Abs: 0.6 10*3/uL — ABNORMAL LOW (ref 1.0–3.6)
MCH: 36.6 pg — ABNORMAL HIGH (ref 26.0–34.0)
MCHC: 35 g/dL (ref 32.0–36.0)
MCV: 104.6 fL — ABNORMAL HIGH (ref 80.0–100.0)
Monocytes Absolute: 0.2 10*3/uL (ref 0.2–1.0)
Monocytes Relative: 11 %
Neutro Abs: 1.1 10*3/uL — ABNORMAL LOW (ref 1.4–6.5)
Neutrophils Relative %: 51 %
Platelets: 72 10*3/uL — ABNORMAL LOW (ref 150–440)
RBC: 2.61 MIL/uL — ABNORMAL LOW (ref 4.40–5.90)
RDW: 18.4 % — ABNORMAL HIGH (ref 11.5–14.5)
WBC: 2.2 10*3/uL — ABNORMAL LOW (ref 3.8–10.6)

## 2017-03-18 MED ORDER — TBO-FILGRASTIM 480 MCG/0.8ML ~~LOC~~ SOSY
480.0000 ug | PREFILLED_SYRINGE | Freq: Once | SUBCUTANEOUS | Status: AC
  Administered 2017-03-18: 480 ug via SUBCUTANEOUS
  Filled 2017-03-18: qty 0.8

## 2017-03-18 MED ORDER — FILGRASTIM 480 MCG/0.8ML IJ SOSY: 480.0000 ug | PREFILLED_SYRINGE | Freq: Once | INTRAMUSCULAR | Status: DC

## 2017-03-25 ENCOUNTER — Inpatient Hospital Stay: Payer: Medicare Other | Attending: Hematology and Oncology

## 2017-03-25 ENCOUNTER — Inpatient Hospital Stay: Payer: Medicare Other

## 2017-03-25 DIAGNOSIS — Z85828 Personal history of other malignant neoplasm of skin: Secondary | ICD-10-CM | POA: Diagnosis not present

## 2017-03-25 DIAGNOSIS — N400 Enlarged prostate without lower urinary tract symptoms: Secondary | ICD-10-CM | POA: Diagnosis not present

## 2017-03-25 DIAGNOSIS — Z8719 Personal history of other diseases of the digestive system: Secondary | ICD-10-CM | POA: Diagnosis not present

## 2017-03-25 DIAGNOSIS — Z86010 Personal history of colonic polyps: Secondary | ICD-10-CM | POA: Diagnosis not present

## 2017-03-25 DIAGNOSIS — Z7982 Long term (current) use of aspirin: Secondary | ICD-10-CM | POA: Insufficient documentation

## 2017-03-25 DIAGNOSIS — Z7984 Long term (current) use of oral hypoglycemic drugs: Secondary | ICD-10-CM | POA: Diagnosis not present

## 2017-03-25 DIAGNOSIS — D46Z Other myelodysplastic syndromes: Secondary | ICD-10-CM

## 2017-03-25 DIAGNOSIS — C9141 Hairy cell leukemia, in remission: Secondary | ICD-10-CM | POA: Insufficient documentation

## 2017-03-25 DIAGNOSIS — E119 Type 2 diabetes mellitus without complications: Secondary | ICD-10-CM | POA: Diagnosis not present

## 2017-03-25 DIAGNOSIS — D709 Neutropenia, unspecified: Secondary | ICD-10-CM | POA: Insufficient documentation

## 2017-03-25 DIAGNOSIS — T451X5A Adverse effect of antineoplastic and immunosuppressive drugs, initial encounter: Secondary | ICD-10-CM

## 2017-03-25 DIAGNOSIS — D4620 Refractory anemia with excess of blasts, unspecified: Secondary | ICD-10-CM | POA: Diagnosis not present

## 2017-03-25 DIAGNOSIS — Z87891 Personal history of nicotine dependence: Secondary | ICD-10-CM | POA: Insufficient documentation

## 2017-03-25 DIAGNOSIS — Z9221 Personal history of antineoplastic chemotherapy: Secondary | ICD-10-CM | POA: Diagnosis not present

## 2017-03-25 DIAGNOSIS — I10 Essential (primary) hypertension: Secondary | ICD-10-CM | POA: Diagnosis not present

## 2017-03-25 DIAGNOSIS — D701 Agranulocytosis secondary to cancer chemotherapy: Secondary | ICD-10-CM

## 2017-03-25 DIAGNOSIS — Z8619 Personal history of other infectious and parasitic diseases: Secondary | ICD-10-CM | POA: Insufficient documentation

## 2017-03-25 DIAGNOSIS — C9000 Multiple myeloma not having achieved remission: Secondary | ICD-10-CM

## 2017-03-25 DIAGNOSIS — Z8701 Personal history of pneumonia (recurrent): Secondary | ICD-10-CM | POA: Insufficient documentation

## 2017-03-25 DIAGNOSIS — E538 Deficiency of other specified B group vitamins: Secondary | ICD-10-CM | POA: Insufficient documentation

## 2017-03-25 LAB — CBC WITH DIFFERENTIAL/PLATELET
Basophils Absolute: 0 10*3/uL (ref 0–0.1)
Basophils Relative: 1 %
Eosinophils Absolute: 0.2 10*3/uL (ref 0–0.7)
Eosinophils Relative: 6 %
HCT: 28.3 % — ABNORMAL LOW (ref 40.0–52.0)
Hemoglobin: 9.9 g/dL — ABNORMAL LOW (ref 13.0–18.0)
Lymphocytes Relative: 32 %
Lymphs Abs: 1 10*3/uL (ref 1.0–3.6)
MCH: 37.1 pg — ABNORMAL HIGH (ref 26.0–34.0)
MCHC: 35.1 g/dL (ref 32.0–36.0)
MCV: 105.7 fL — ABNORMAL HIGH (ref 80.0–100.0)
Monocytes Absolute: 0.3 10*3/uL (ref 0.2–1.0)
Monocytes Relative: 11 %
Neutro Abs: 1.5 10*3/uL (ref 1.4–6.5)
Neutrophils Relative %: 50 %
Platelets: 82 10*3/uL — ABNORMAL LOW (ref 150–440)
RBC: 2.67 MIL/uL — ABNORMAL LOW (ref 4.40–5.90)
RDW: 18.4 % — ABNORMAL HIGH (ref 11.5–14.5)
WBC: 3 10*3/uL — ABNORMAL LOW (ref 3.8–10.6)

## 2017-03-25 MED ORDER — FILGRASTIM 480 MCG/0.8ML IJ SOSY: 480.0000 ug | PREFILLED_SYRINGE | Freq: Once | INTRAMUSCULAR | Status: DC

## 2017-03-25 MED ORDER — TBO-FILGRASTIM 480 MCG/0.8ML ~~LOC~~ SOSY
480.0000 ug | PREFILLED_SYRINGE | Freq: Once | SUBCUTANEOUS | Status: AC
  Administered 2017-03-25: 480 ug via SUBCUTANEOUS
  Filled 2017-03-25: qty 0.8

## 2017-03-27 DIAGNOSIS — L570 Actinic keratosis: Secondary | ICD-10-CM | POA: Diagnosis not present

## 2017-03-27 DIAGNOSIS — Z85828 Personal history of other malignant neoplasm of skin: Secondary | ICD-10-CM | POA: Diagnosis not present

## 2017-03-27 DIAGNOSIS — L820 Inflamed seborrheic keratosis: Secondary | ICD-10-CM | POA: Diagnosis not present

## 2017-03-27 DIAGNOSIS — L578 Other skin changes due to chronic exposure to nonionizing radiation: Secondary | ICD-10-CM | POA: Diagnosis not present

## 2017-04-01 ENCOUNTER — Inpatient Hospital Stay: Payer: Medicare Other

## 2017-04-01 ENCOUNTER — Inpatient Hospital Stay (HOSPITAL_BASED_OUTPATIENT_CLINIC_OR_DEPARTMENT_OTHER): Payer: Medicare Other | Admitting: Hematology and Oncology

## 2017-04-01 VITALS — BP 138/69 | HR 71 | Temp 98.5°F | Resp 18 | Wt 217.2 lb

## 2017-04-01 DIAGNOSIS — I10 Essential (primary) hypertension: Secondary | ICD-10-CM | POA: Diagnosis not present

## 2017-04-01 DIAGNOSIS — Z85828 Personal history of other malignant neoplasm of skin: Secondary | ICD-10-CM

## 2017-04-01 DIAGNOSIS — D708 Other neutropenia: Secondary | ICD-10-CM

## 2017-04-01 DIAGNOSIS — Z7982 Long term (current) use of aspirin: Secondary | ICD-10-CM | POA: Diagnosis not present

## 2017-04-01 DIAGNOSIS — C9141 Hairy cell leukemia, in remission: Secondary | ICD-10-CM

## 2017-04-01 DIAGNOSIS — Z7984 Long term (current) use of oral hypoglycemic drugs: Secondary | ICD-10-CM

## 2017-04-01 DIAGNOSIS — Z87891 Personal history of nicotine dependence: Secondary | ICD-10-CM

## 2017-04-01 DIAGNOSIS — E538 Deficiency of other specified B group vitamins: Secondary | ICD-10-CM

## 2017-04-01 DIAGNOSIS — C9000 Multiple myeloma not having achieved remission: Secondary | ICD-10-CM

## 2017-04-01 DIAGNOSIS — N400 Enlarged prostate without lower urinary tract symptoms: Secondary | ICD-10-CM

## 2017-04-01 DIAGNOSIS — D4620 Refractory anemia with excess of blasts, unspecified: Secondary | ICD-10-CM | POA: Diagnosis not present

## 2017-04-01 DIAGNOSIS — D709 Neutropenia, unspecified: Secondary | ICD-10-CM | POA: Diagnosis not present

## 2017-04-01 DIAGNOSIS — T451X5A Adverse effect of antineoplastic and immunosuppressive drugs, initial encounter: Secondary | ICD-10-CM

## 2017-04-01 DIAGNOSIS — Z8719 Personal history of other diseases of the digestive system: Secondary | ICD-10-CM | POA: Diagnosis not present

## 2017-04-01 DIAGNOSIS — E119 Type 2 diabetes mellitus without complications: Secondary | ICD-10-CM | POA: Diagnosis not present

## 2017-04-01 DIAGNOSIS — Z8701 Personal history of pneumonia (recurrent): Secondary | ICD-10-CM

## 2017-04-01 DIAGNOSIS — Z8619 Personal history of other infectious and parasitic diseases: Secondary | ICD-10-CM

## 2017-04-01 DIAGNOSIS — Z86010 Personal history of colonic polyps: Secondary | ICD-10-CM | POA: Diagnosis not present

## 2017-04-01 DIAGNOSIS — Z9221 Personal history of antineoplastic chemotherapy: Secondary | ICD-10-CM | POA: Diagnosis not present

## 2017-04-01 DIAGNOSIS — D46Z Other myelodysplastic syndromes: Secondary | ICD-10-CM

## 2017-04-01 DIAGNOSIS — D701 Agranulocytosis secondary to cancer chemotherapy: Secondary | ICD-10-CM

## 2017-04-01 LAB — COMPREHENSIVE METABOLIC PANEL
ALT: 20 U/L (ref 17–63)
AST: 23 U/L (ref 15–41)
Albumin: 3.9 g/dL (ref 3.5–5.0)
Alkaline Phosphatase: 66 U/L (ref 38–126)
Anion gap: 6 (ref 5–15)
BUN: 35 mg/dL — ABNORMAL HIGH (ref 6–20)
CO2: 23 mmol/L (ref 22–32)
Calcium: 8.6 mg/dL — ABNORMAL LOW (ref 8.9–10.3)
Chloride: 107 mmol/L (ref 101–111)
Creatinine, Ser: 1.51 mg/dL — ABNORMAL HIGH (ref 0.61–1.24)
GFR calc Af Amer: 49 mL/min — ABNORMAL LOW (ref >60)
GFR calc non Af Amer: 42 mL/min — ABNORMAL LOW (ref >60)
Glucose, Bld: 322 mg/dL — ABNORMAL HIGH (ref 65–99)
Potassium: 4.7 mmol/L (ref 3.5–5.1)
Sodium: 136 mmol/L (ref 135–145)
Total Bilirubin: 0.9 mg/dL (ref 0.3–1.2)
Total Protein: 6.8 g/dL (ref 6.5–8.1)

## 2017-04-01 LAB — CBC WITH DIFFERENTIAL/PLATELET
Basophils Absolute: 0 10*3/uL (ref 0–0.1)
Basophils Relative: 1 %
Eosinophils Absolute: 0.1 10*3/uL (ref 0–0.7)
Eosinophils Relative: 5 %
HCT: 25.5 % — ABNORMAL LOW (ref 40.0–52.0)
Hemoglobin: 8.9 g/dL — ABNORMAL LOW (ref 13.0–18.0)
Lymphocytes Relative: 25 %
Lymphs Abs: 0.6 10*3/uL — ABNORMAL LOW (ref 1.0–3.6)
MCH: 37.3 pg — ABNORMAL HIGH (ref 26.0–34.0)
MCHC: 35 g/dL (ref 32.0–36.0)
MCV: 106.6 fL — ABNORMAL HIGH (ref 80.0–100.0)
Monocytes Absolute: 0.2 10*3/uL (ref 0.2–1.0)
Monocytes Relative: 11 %
Neutro Abs: 1.3 10*3/uL — ABNORMAL LOW (ref 1.4–6.5)
Neutrophils Relative %: 58 %
Platelets: 79 10*3/uL — ABNORMAL LOW (ref 150–440)
RBC: 2.39 MIL/uL — ABNORMAL LOW (ref 4.40–5.90)
RDW: 18.8 % — ABNORMAL HIGH (ref 11.5–14.5)
WBC: 2.2 10*3/uL — ABNORMAL LOW (ref 3.8–10.6)

## 2017-04-01 LAB — MAGNESIUM: Magnesium: 2.3 mg/dL (ref 1.7–2.4)

## 2017-04-01 MED ORDER — FILGRASTIM 480 MCG/0.8ML IJ SOSY: 480.0000 ug | PREFILLED_SYRINGE | Freq: Once | INTRAMUSCULAR | Status: DC

## 2017-04-01 MED ORDER — LENALIDOMIDE 5 MG PO CAPS: ORAL_CAPSULE | ORAL | 0 refills | Status: DC

## 2017-04-01 MED ORDER — TBO-FILGRASTIM 480 MCG/0.8ML ~~LOC~~ SOSY
480.0000 ug | PREFILLED_SYRINGE | Freq: Once | SUBCUTANEOUS | Status: AC
  Administered 2017-04-01: 480 ug via SUBCUTANEOUS
  Filled 2017-04-01: qty 0.8

## 2017-04-01 NOTE — Progress Notes (Signed)
[facility] day:  04/01/2017   Chief Complaint: Alex Smith is a 82 y.o. male with a myelodysplastic marrow, smoldering myeloma, and a history of hairy cell leukemia who is seen for 1 month assessment.  HPI:  The patient was last seen in the medical oncology clinic on 03/03/2017.  At that time, voiced no complaints.  He had been off Revlimid for 1 1/2 weeks secondary to low counts.  CBC revealed a hematocrit of 8.5, hemoglobin 10, platelets 71,000, white count 1500 with an ANC of 800.  He received Neupogen.  He has had weekly counts.  He has received GCSF.  Counts have improved. CBC on 03/25/2017 revealed a hematocrit 28.3, hemoglobin 9.9, platelets 82,000, white count 3000 with an ANC of 1500.  During the interim, he has done well.  He denies any fevers, sweats or weight loss.  He denies any bone pain.  He has trouble sleeping at times.  He is out of Revlimid.   Past Medical History:  Diagnosis Date  . Anemia   . B12 deficiency   . Basal cell carcinoma of face   . BPH (benign prostatic hypertrophy)    followed by urology, discharged (Dr. Bernardo Heater)  . CAP (community acquired pneumonia) 02/15/2015  . Colon polyps   . Diverticulosis   . GERD (gastroesophageal reflux disease)   . 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 History of pneumonia 2000's   "once" (07/07/2012)  . History of shingles   . HLD (hyperlipidemia)   . Hypertension   . Pneumonia   . Shortness of breath dyspnea   . Systolic murmur 50/35/4656  . Type 2 diabetes, controlled, with retinopathy ([place])     Past Surgical History:  Procedure Laterality Date  . [place] VITRECTOMY WITH 20 GAUGE MVR PORT FOR MACULAR HOLE  07/07/2012   Procedure: 25 GAUGE PARS PLANA VITRECTOMY WITH 20 GAUGE MVR PORT FOR MACULAR HOLE;  Surgeon: Hayden Pedro, MD;  Location: [facility];  Service: Ophthalmology;  Laterality: Left;  .  BONE MARROW BIOPSY  2016  . CARDIOVASCULAR STRESS TEST  2013   treadmill - no evidence ischemia, EF 61%  . CATARACT EXTRACTION W/ INTRAOCULAR LENS  IMPLANT, BILATERAL  ~ 2010  . COLONOSCOPY  2014   [facility] WNL no rpt needed, h/o polyps  . EYE SURGERY Left 06/2012   laser surgery  . GAS INSERTION  07/07/2012   Procedure: INSERTION OF GAS;  Surgeon: Hayden Pedro, MD;  Location: [facility];  Service: Ophthalmology;  Laterality: Left;  [id number]  . PERIPHERAL VASCULAR CATHETERIZATION N/A 02/23/2015   Procedure: Glori Luis Cath Insertion;  Surgeon: Algernon Huxley, MD;  Location: [facility];  Service: Cardiovascular;  Laterality: N/A;  . SERUM PATCH  07/07/2012   Procedure: SERUM PATCH;  Surgeon: Hayden Pedro, MD;  Location: [facility];  Service: Ophthalmology;  Laterality: Left;  . SKIN CANCER EXCISION     "all over my face" (07/07/2012)    Family History  Problem Relation Age of Onset  . Dementia Mother   . Heart failure Father 17  . Cancer Sister        breast  . Diabetes Paternal Uncle   . Diabetes Paternal Aunt   . CAD Brother 16       MI  . Stroke Neg Hx     Social History:  reports that he quit  smoking about 48 years ago. His smoking use included Cigarettes. He has a 50.00 pack-year smoking history. He has never used smokeless tobacco. He reports that he drinks alcohol. He reports that he does not use drugs.  He works at [employer] 20-32 hours/week.  The patient 's wife has had 3 hip operations.  She underwent hip replacement 03/06/2016.  His wife is now in a nursing home.  He states that they either eat out or bring food in.  His best contact number is his cell: [phone].  He is alone today.  Allergies:  Allergies  Allergen Reactions  . Rituximab Rash    Chest tightness  . Blood-Group Specific Substance Other (See Comments)    Had a post transfusion reaction of red blood cells; NOW REQUIRES WASHED BLOOD CELLS  . Primaxin [Imipenem] Other (See Comments)    Possible allergy  .  Sulfa Antibiotics Itching and Rash  Possible allergy to Zoloft, allopurinol, and a chemotherapy medication.  He tolerates Dapsone.  Current Medications: Current Outpatient Prescriptions  Medication Sig Dispense Refill  . acyclovir (ZOVIRAX) 400 MG tablet TAKE TWO TABLETS TWICE DAILY 120 tablet 6  . aspirin EC 81 MG tablet Take 81 mg by mouth daily.    Marland Kitchen glucose blood (ACCU-CHEK AVIVA PLUS) test strip Use to check sugar twice daily Dx:E11.9 200 each 3  . HUMULIN R 100 UNIT/ML injection INJECT 10 UNITS SUBCUTANEOUSLY TWICE DAILY BEFORE MEALS 10 mL 6  . hydrochlorothiazide (HYDRODIURIL) 25 MG tablet TAKE ONE TABLET EVERY DAY 30 tablet 5  . insulin NPH Human (HUMULIN N) 100 UNIT/ML injection Inject 0.39 mLs (39 Units total) into the skin 2 (two) times daily before a meal. 20 mL 6  . lidocaine-prilocaine (EMLA) cream Apply 1 application topically as needed. 30 g 3  . lisinopril (PRINIVIL,ZESTRIL) 20 MG tablet TAKE 1 TABLET BY MOUTH TWICE DAILY 60 tablet 6  . metFORMIN (GLUCOPHAGE) 1000 MG tablet TAKE ONE TABLET BY MOUTH TWICE DAILY AS DIRECTED 180 tablet 1  . MICROLET LANCETS MISC USE AS DIRECTED 200 each 3  . pravastatin (PRAVACHOL) 20 MG tablet TAKE ONE TABLET AT BEDTIME 30 tablet 6  . ranitidine (ZANTAC) 150 MG tablet TAKE 1 TABLET BY MOUTH TWICE DAILY AS NEEDED 180 tablet 3  . tamsulosin (FLOMAX) 0.4 MG CAPS capsule TAKE 1 CAPSULE BY MOUTH EVERY DAY 30 capsule 2  . temazepam (RESTORIL) 7.5 MG capsule Take 7.5 mg by mouth once.     . traZODone (DESYREL) 50 MG tablet TAKE 1/2 TO 1 TABLET BY MOUTH AT BEDTIMEAS NEEDED FOR SLEEP 30 tablet 0  . vitamin E 400 UNIT capsule Take 200 Units by mouth daily.     Marland Kitchen lenalidomide (REVLIMID) 5 MG capsule 5 mg a day for 21 days then 7 days off 21 capsule 0   Current Facility-Administered Medications  Medication Dose Route Frequency Provider Last Rate Last Dose  . Tbo-Filgrastim (GRANIX) injection 480 mcg  480 mcg Subcutaneous Once Lequita Asal, MD        Facility-Administered Medications Ordered in Other Visits  Medication Dose Route Frequency Provider Last Rate Last Dose  . heparin lock flush 100 unit/mL  500 Units Intravenous Once Cor[place]an, Melissa C, MD      . sodium chloride 0.9 % injection 10 mL  10 mL Intravenous PRN Lequita Asal, MD   10 mL at 03/03/15 0903  . sodium chloride 0.9 % injection 10 mL  10 mL Intracatheter PRN Lequita Asal, MD  10 mL at 03/10/15 1410   Review of Systems:  GENERAL:  Feels "ok".  No fevers or sweats.  Weight stable. PERFORMANCE STATUS (ECOG):  1 HEENT: No visual changes, runny nose, sore throat. Pulmonary:  Chronic shortness of breath on exertion.  No cough.  No hemoptysis. Cardiac:  No chest pain, palpitations, orthopnea, or PND. GI:  Appetite good.  No nausea, vomiting, diarrhea, constipation, melena or hematochezia.  GU:  No urgency, frequency, dysuria, or hematuria.  On Flomax. Musculoskeletal:  No back pain.  No joint pain.  No muscle tenderness. Extremities:  No pain or swelling. Skin:  No rashes or skin changes. Neuro:  No headache, numbness or weakness, balance or coordination issues. Endocrine:  Diabetes.  No thyroid issues, hot flashes or night sweats. Psych:  No mood changes, depression or anxiety.  Pain:  No focal pain. Review of systems:  All other systems reviewed and found to be negative.   Physical Exam: Blood pressure 138/69, pulse 71, temperature 98.5 F (36.9 C), temperature source Tympanic, resp. rate 18, weight 217 lb 4 oz (98.5 kg).  GENERAL:  Well developed, well nourished, gentleman sitting comfortably in the exam room in no acute distress. MENTAL STATUS:  Alert and oriented to person, place and time. HEAD:  Pearline Cables white hair.  Normocephalic, atraumatic, face symmetric, no Cushingoid features. EYES:  Glasses. Blue eyes.  Pupils equal round and reactive to light and accomodation.  No conjunctivitis or scleral icterus. ENT:  Oropharynx clear without lesion.  Tongue  normal. Mucous membranes moist.  RESPIRATORY:  Clear to auscultation without rales, wheezes or rhonchi. CARDIOVASCULAR:  Regular rate and rhythm without murmur, rub or gallop. ABDOMEN:  Soft, non-tender, with active bowel sounds, and no hepatosplenomegaly.  No masses. SKIN:  No rashes or ulcers. EXTREMITIES: No edema, no skin discoloration or tenderness.  No palpable cords. LYMPH NODES: No palpable cervical, supraclavicular, axillary or inguinal adenopathy  NEUROLOGICAL: Unremarkable. PSYCH:  Appropriate.    Appointment on 04/01/2017  Component Date Value Ref Range Status  . WBC 04/01/2017 2.2* 3.8 - 10.6 K/uL Final  . RBC 04/01/2017 2.39* 4.40 - 5.90 MIL/uL Final  . Hemoglobin 04/01/2017 8.9* 13.0 - 18.0 g/dL Final  . HCT 04/01/2017 25.5* 40.0 - 52.0 % Final  . MCV 04/01/2017 106.6* 80.0 - 100.0 fL Final  . MCH 04/01/2017 37.3* 26.0 - 34.0 pg Final  . MCHC 04/01/2017 35.0  32.0 - 36.0 g/dL Final  . RDW 04/01/2017 18.8* 11.5 - 14.5 % Final  . Platelets 04/01/2017 79* 150 - 440 K/uL Final  . Neutrophils Relative % 04/01/2017 58  % Final  . Neutro Abs 04/01/2017 1.3* 1.4 - 6.5 K/uL Final  . Lymphocytes Relative 04/01/2017 25  % Final  . Lymphs Abs 04/01/2017 0.6* 1.0 - 3.6 K/uL Final  . Monocytes Relative 04/01/2017 11  % Final  . Monocytes Absolute 04/01/2017 0.2  0.2 - 1.0 K/uL Final  . Eosinophils Relative 04/01/2017 5  % Final  . Eosinophils Absolute 04/01/2017 0.1  0 - 0.7 K/uL Final  . Basophils Relative 04/01/2017 1  % Final  . Basophils Absolute 04/01/2017 0.0  0 - 0.1 K/uL Final  . Magnesium 04/01/2017 2.3  1.7 - 2.4 mg/dL Final  . Sodium 04/01/2017 136  135 - 145 mmol/L Final  . Potassium 04/01/2017 4.7  3.5 - 5.1 mmol/L Final  . Chloride 04/01/2017 107  101 - 111 mmol/L Final  . CO2 04/01/2017 23  22 - 32 mmol/L Final  . Glucose, Bld  04/01/2017 322* 65 - 99 mg/dL Final  . BUN 04/01/2017 35* 6 - 20 mg/dL Final  . Creatinine, Ser 04/01/2017 1.51* 0.61 - 1.24 mg/dL Final   . Calcium 04/01/2017 8.6* 8.9 - 10.3 mg/dL Final  . Total Protein 04/01/2017 6.8  6.5 - 8.1 g/dL Final  . Albumin 04/01/2017 3.9  3.5 - 5.0 g/dL Final  . AST 04/01/2017 23  15 - 41 U/L Final  . ALT 04/01/2017 20  17 - 63 U/L Final  . Alkaline Phosphatase 04/01/2017 66  38 - 126 U/L Final  . Total Bilirubin 04/01/2017 0.9  0.3 - 1.2 mg/dL Final  . GFR calc non Af Amer 04/01/2017 42* >60 mL/min Final  . GFR calc Af Amer 04/01/2017 49* >60 mL/min Final   Comment: (NOTE) The eGFR has been calculated using the CKD EPI equation. This calculation has not been validated in all clinical situations. eGFR's persistently <60 mL/min signify possible Chronic Kidney Disease.   Georgiann Hahn gap 04/01/2017 6  5 - 15 Final    Assessment:  KEYMANI MCLEAN is a 82 y.o. 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  He was diagnosed with hairy cell leukemia in 2006.  He received cladribine 11/06-11/05/2005.  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   Bone marrow aspirate and biopsy on 02/07/2015 revealed a extensive marrow involvement by recurrent hairy cell leukemia (approximately 80% of cells in the core). There was a monoclonal plasma cell infiltrate (approximately 10%), compatible with a plasma cell neoplasm. Marrow was hypercellular (40-50%) with markedly diminished residual trilineage hematopoiesis and mild multilineage dyspoiesis.  Peripheral smear revealed leukopenia (WBC 1900) with 2% atypical lymphoid cells compatible with hairy cell leukemia. There was moderate anemia with anisopoikilocytosis. 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  FISH studies for MDS were negative.  Cytogenetics were normal (46, XY).  He has an unclear allergy history.  He may be allergic to Primaxin, Zoloft, allopurinol, and a chemotherapy medication.  Reaction occurred in 06/2005.  He is allergic to sulfa.  He is not allergic to  dapsone.  He requires washed RBCs.  He is on prophylactic acyclovir.  Dapsone and voriconazole were discontinued.  He has a history of shingles prior to prophylactic acyclovir.  He is 17 months status post cladribine (began 03/03/2015).  His counts plateaued on 07/25/2015 and have subsequently decreased.  Labs on 07/25/2015 revealed a normal B12 (894), folate (60.5), and TSH (2.562).  Ferritin was 50 on 04/26/2016.  Labs on 08/22/2016 revealed a normal B12, folate, and TSH.  He is on oral B12.  Bone marrow on 11/03/2015 revealed persistent plasma cell neoplasm, now with mildy atypical monoclonal plasma cells estimated at 20-30%.   There was no morphologic evidence of residual hairy cell leukemia.  There was a minute population (0.03%) with hairy cell immunophenotype detected by flow.  Marrow was normocellular for age with relative erythroid hyperplasia, relative myeloid hypoplasia, mild dyspoiesis, adequate megakaryocytes and no increased blasts.  There was no significant increase in marrow reticulin fibers.  Iron was present.  FISH studies for MDS were negative.  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  Cytogenetics were normal (46, XY).  Bone marrow on 03/13/2016 was normocellular to marginally hypercellular for age (20-40%) with relative erythroid hyperplasia and mild dyserythropoiesis, decreased granulopoiesis and adequate megakaryocytes.  There was a persistent 15-20% plasma cell neoplasm.  There was no overt  increase in reticulin fibrosis.  Storage iron was present.  Flow cytometry revealed abnormal/monocytic plasma cell population (3% sample), consistent with a plasma cell neoplastic process.  Cytogenetics were normal (46, XY).  FISH studies for myeloma revealed a CCND1/IGH translocation, t(11;14) in 53% of nuclei.  SPEP has been followed: 1.0 on 02/03/2015, 1.4 gm/dL on 07/25/2015, 1.3 gm/dL on 11/17/2015, 1.2 gm/dL on 06/28/2016 and 1.0 gm/dL on  09/20/2016.    Kappa free light chains have been followed: 47.63 (ratio 3.12) on 02/03/2015, 54.9 (ratio 3.14) on 02/07/2015, 46.23 (ratio 3.68) on 07/25/2015, and 50.5 (ratio 4.82) on 11/17/2015.  Beta2-microglobulin was 2.5 on 11/17/2015.  24 hour urine on 11/21/2015 revealed 234.8 mg protein in 24 hours (no monoclonal protein).  Bone survey on 11/24/2015 revealed no lytic lesions.  Epo level was 329.6 on 03/29/2016.  He began a trial of Procrit.  He received 10,000 units of Procrit weekly (04/26/2016 - 06/14/2016).  Hemoglobin ranged between 8.4 and 8.9 without trend.    He is s/p 1 cycle ofRevlimid (10 mg) and Decadron(06/29/2016 - 07/19/2016). Cycle #1 was complicated by anemia and neutropenia. He has received 4 units of PRBCs(1 unit:07/22/2016 and 2 units: 08/07/2016, 1 unit on 01/14/2017, and 1 unit on 02/27/2017). He received 6 days ofGCSF.  He is s/p 4 cycles of Revlimid (5 mg) and Decadron (began 09/14/2016).  Revlimid has been on hold since 03/04/2017.  He began weekly GCSF on 11/05/2016.  He receives GCSF if his ANC is <= 1500.  He is on aspirin 81 mg a day prophylaxis.  PET scan on 03/14/2016 revealed no evidence of hypermetabolic lymphadenopathy or focal skeletal lesions to suggest marrow or cortical bone involvement.  Symptomatically, he feels "ok".  Exam is stable.  Counts include a hematocrit 25.5, hemoglobin of 8.9, platelets 79,000, and WBC 2200 with ANC 1300.  Plan: 1.  Labs today:  CBC with diff, CMP, Mg. 2.  Restart Revlimid. 3.  Consider reintroduction of Procrit. 4.  Patient to follow-up with Dr. Evelene Croon. 5.  GCSF 480 mcg today. 6.  RTC weekly x 3 for labs (CBC with diff) +/- Neupogen. 7.  RTC in 4 weeks for MD assessment, labs (CBC with diff, CMP, Mg) and +/- Neupogen.   Lequita Asal, MD  04/01/2017

## 2017-04-01 NOTE — Progress Notes (Signed)
Patient states his appetite is fair.  Has problems sleeping sometimes.  Otherwise, no complaints.

## 2017-04-04 ENCOUNTER — Telehealth: Payer: Self-pay | Admitting: Family Medicine

## 2017-04-04 ENCOUNTER — Emergency Department
Admission: EM | Admit: 2017-04-04 | Discharge: 2017-04-04 | Disposition: A | Discharge status: Home / Self Care | Payer: Medicare Other | Attending: Emergency Medicine | Admitting: Emergency Medicine

## 2017-04-04 ENCOUNTER — Emergency Department: Payer: Medicare Other

## 2017-04-04 ENCOUNTER — Encounter: Payer: Self-pay | Admitting: Emergency Medicine

## 2017-04-04 DIAGNOSIS — Z7984 Long term (current) use of oral hypoglycemic drugs: Secondary | ICD-10-CM | POA: Insufficient documentation

## 2017-04-04 DIAGNOSIS — R197 Diarrhea, unspecified: Secondary | ICD-10-CM | POA: Diagnosis not present

## 2017-04-04 DIAGNOSIS — Z87891 Personal history of nicotine dependence: Secondary | ICD-10-CM | POA: Insufficient documentation

## 2017-04-04 DIAGNOSIS — I10 Essential (primary) hypertension: Secondary | ICD-10-CM | POA: Insufficient documentation

## 2017-04-04 DIAGNOSIS — R1031 Right lower quadrant pain: Secondary | ICD-10-CM | POA: Insufficient documentation

## 2017-04-04 DIAGNOSIS — E119 Type 2 diabetes mellitus without complications: Secondary | ICD-10-CM | POA: Insufficient documentation

## 2017-04-04 DIAGNOSIS — Z7982 Long term (current) use of aspirin: Secondary | ICD-10-CM | POA: Insufficient documentation

## 2017-04-04 LAB — CBC
HEMATOCRIT: 28.7 % — AB (ref 40.0–52.0)
HEMOGLOBIN: 9.8 g/dL — AB (ref 13.0–18.0)
MCH: 37.1 pg — ABNORMAL HIGH (ref 26.0–34.0)
MCHC: 34.1 g/dL (ref 32.0–36.0)
MCV: 108.7 fL — ABNORMAL HIGH (ref 80.0–100.0)
Platelets: 94 10*3/uL — ABNORMAL LOW (ref 150–440)
RBC: 2.64 MIL/uL — AB (ref 4.40–5.90)
RDW: 19.3 % — AB (ref 11.5–14.5)
WBC: 4.7 10*3/uL (ref 3.8–10.6)

## 2017-04-04 LAB — COMPREHENSIVE METABOLIC PANEL
ALT: 17 U/L (ref 17–63)
AST: 20 U/L (ref 15–41)
Albumin: 4.3 g/dL (ref 3.5–5.0)
Alkaline Phosphatase: 77 U/L (ref 38–126)
Anion gap: 5 (ref 5–15)
BUN: 37 mg/dL — ABNORMAL HIGH (ref 6–20)
CALCIUM: 9.4 mg/dL (ref 8.9–10.3)
CHLORIDE: 108 mmol/L (ref 101–111)
CO2: 24 mmol/L (ref 22–32)
CREATININE: 1.66 mg/dL — AB (ref 0.61–1.24)
GFR, EST AFRICAN AMERICAN: 43 mL/min — AB (ref >60)
GFR, EST NON AFRICAN AMERICAN: 37 mL/min — AB (ref >60)
Glucose, Bld: 190 mg/dL — ABNORMAL HIGH (ref 65–99)
Potassium: 4.9 mmol/L (ref 3.5–5.1)
Sodium: 137 mmol/L (ref 135–145)
Total Bilirubin: 0.8 mg/dL (ref 0.3–1.2)
Total Protein: 7.7 g/dL (ref 6.5–8.1)

## 2017-04-04 LAB — URINALYSIS, COMPLETE (UACMP) WITH MICROSCOPIC
BACTERIA UA: NONE SEEN
BILIRUBIN URINE: NEGATIVE
Glucose, UA: >=500 mg/dL — AB
Hgb urine dipstick: NEGATIVE
KETONES UR: NEGATIVE mg/dL
LEUKOCYTES UA: NEGATIVE
NITRITE: NEGATIVE
PH: 5 (ref 5.0–8.0)
Protein, ur: NEGATIVE mg/dL
RBC / HPF: NONE SEEN RBC/hpf (ref 0–5)
Specific Gravity, Urine: 1.015 (ref 1.005–1.030)
Squamous Epithelial / LPF: NONE SEEN

## 2017-04-04 LAB — LIPASE, BLOOD: LIPASE: 23 U/L (ref 11–51)

## 2017-04-04 IMAGING — CT CT ABD-PELV W/ CM
2 of 5 series · 15 of 46 positions shown, 17 images · IV contrast (APPLIED)
Comparison: [DATE], [DATE]

CLINICAL DATA: Right lower quadrant pain with diarrhea

EXAM:
CT ABDOMEN AND PELVIS WITH CONTRAST
TECHNIQUE: Multidetector CT imaging of the abdomen and pelvis was performed
using the standard protocol following bolus administration of
intravenous contrast.
CONTRAST:  80mL [Z8] IOPAMIDOL ([Z8]) INJECTION 61%

[Series 2: routine abd/pel with · axial · 0.82mm/px · z∈[-1236,-780]mm · 12 of 103 slices shown, 14 images]
[im 6/103  soft-tissue]
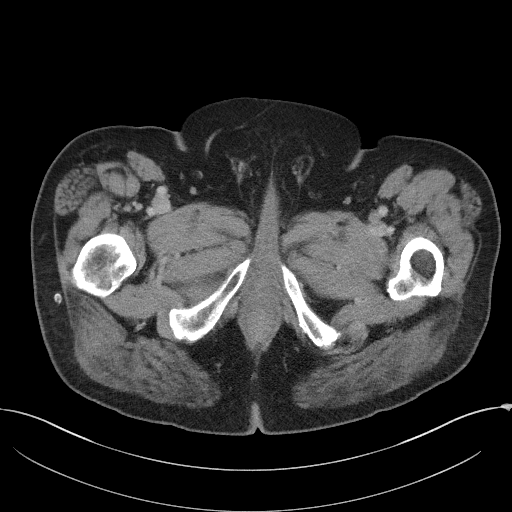
[im 6/103  bone]
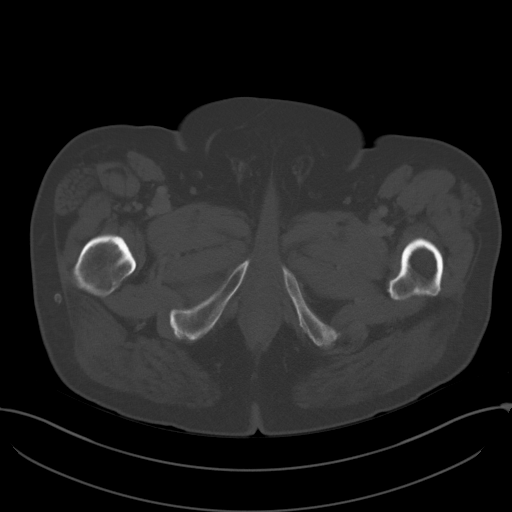
[im 16/103  soft-tissue]
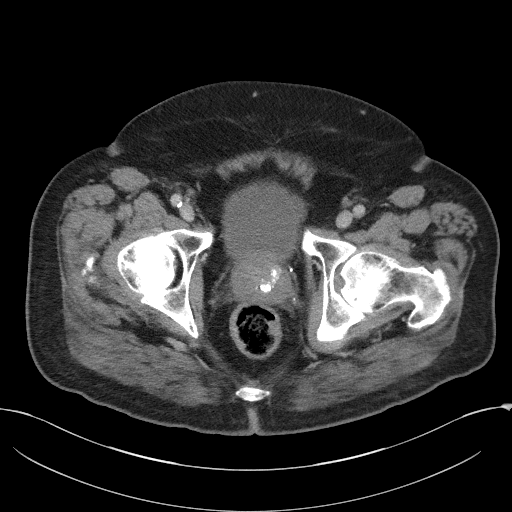
[im 21/103  soft-tissue]
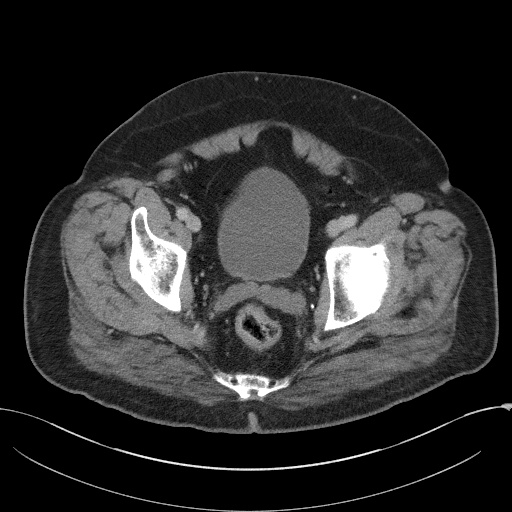
[im 31/103  soft-tissue]
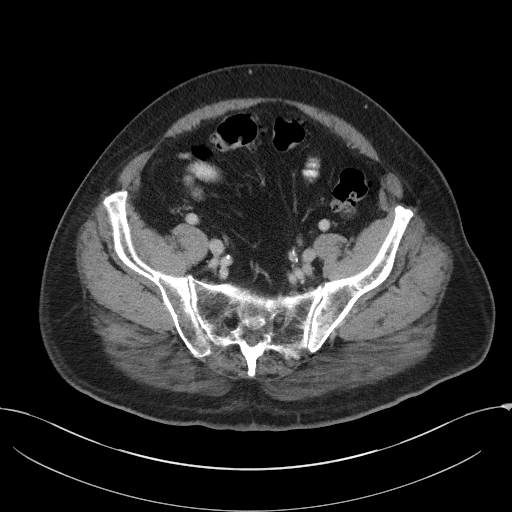
[im 41/103  soft-tissue]
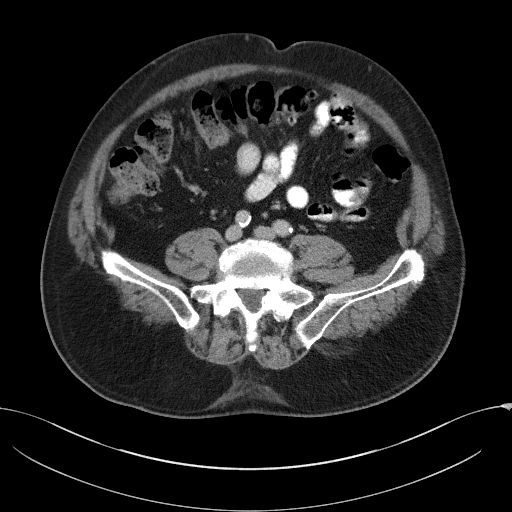
[im 46/103  soft-tissue]
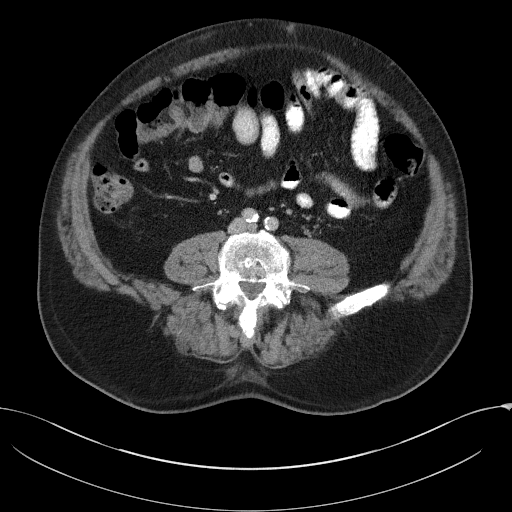
[im 57/103  soft-tissue]
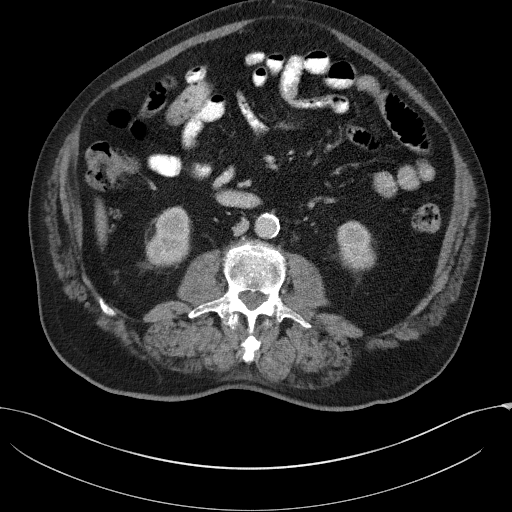
[im 62/103  soft-tissue]
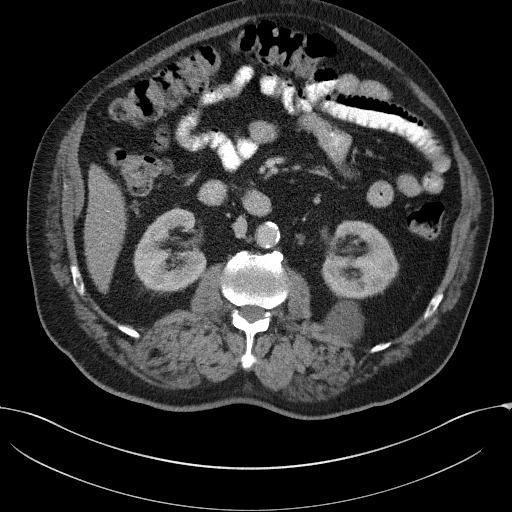
[im 72/103  soft-tissue]
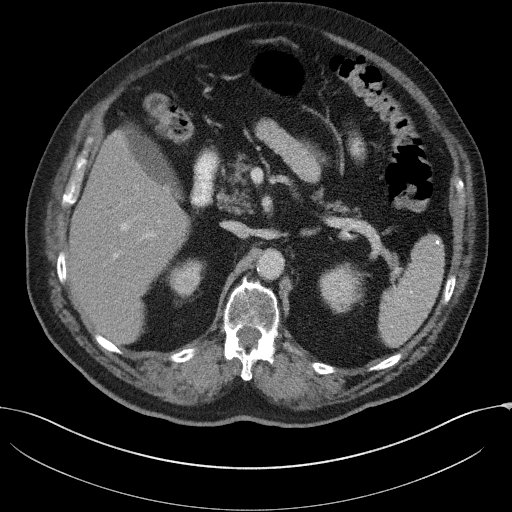
[im 72/103  bone]
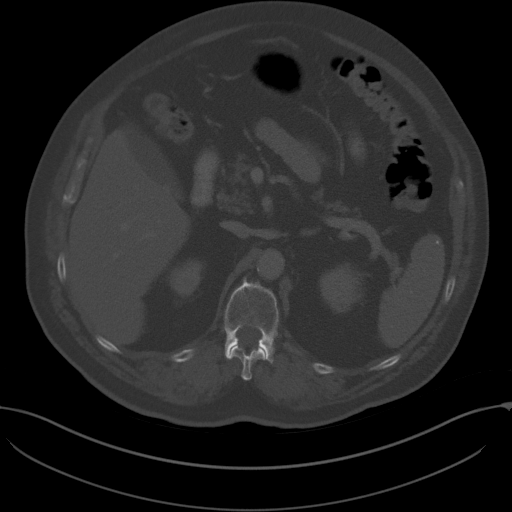
[im 82/103  soft-tissue]
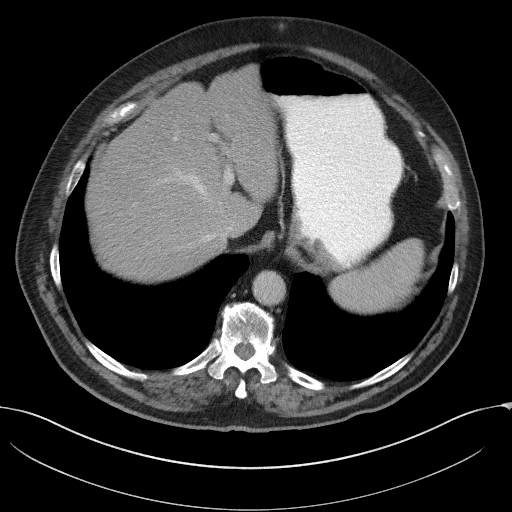
[im 87/103  soft-tissue]
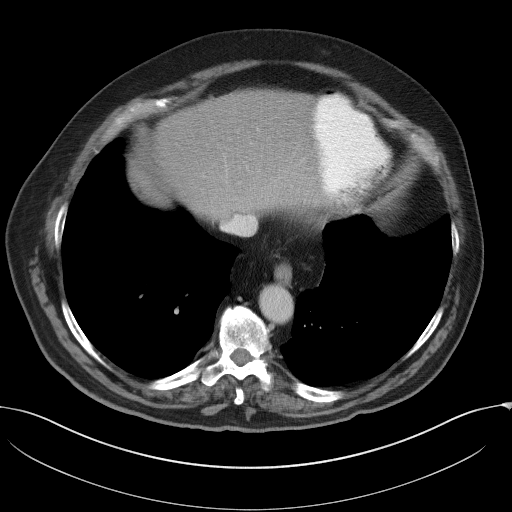
[im 97/103  soft-tissue]
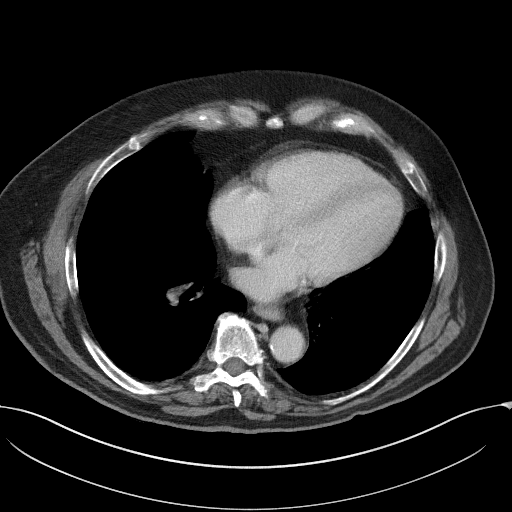

[Series 6: coronal st · coronal · 0.70mm/px · 3 of 116 slices shown]
[im 39/116  soft-tissue]
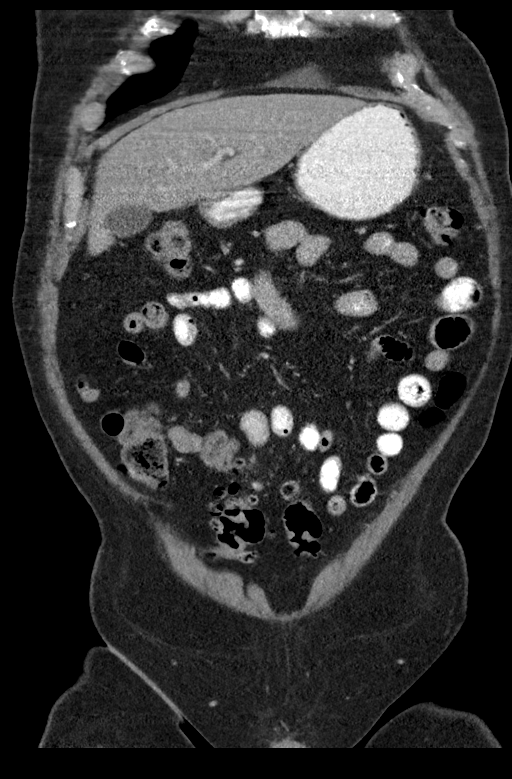
[im 52/116  soft-tissue]
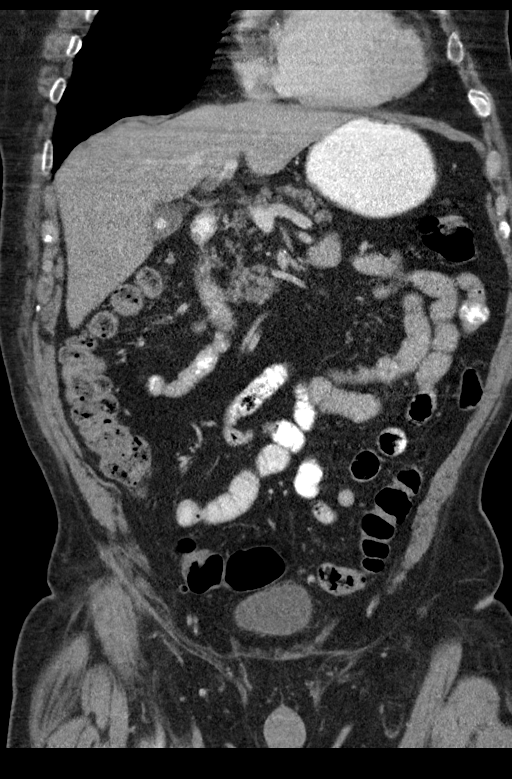
[im 64/116  soft-tissue]
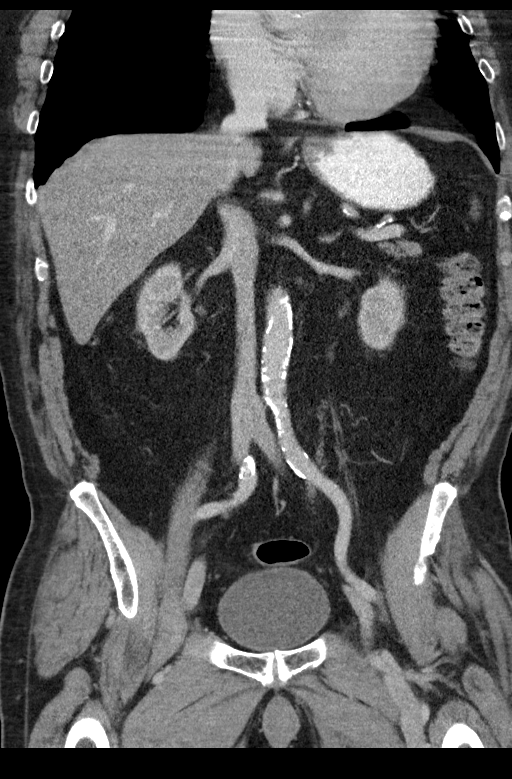

[15 of 46 positions shown; findings below may reference images not displayed]

FINDINGS: Lower chest: Lung bases demonstrate no acute consolidation or
pleural effusion. Heart size upper normal. Trace pericardial
effusion. Coronary calcifications.

Hepatobiliary: Mild steatosis. Multiple calcified gallstones. No
biliary dilatation

Pancreas: Unremarkable. No pancreatic ductal dilatation or
surrounding inflammatory changes.

Spleen: Calcified granuloma, otherwise negative

Adrenals/Urinary Tract: Adrenal glands are within normal limits.
Negative for hydronephrosis. Cysts within the left kidney. Bladder
within normal limits

Stomach/Bowel: Stomach is within normal limits. Appendix appears
normal. No evidence of bowel wall thickening, distention, or
inflammatory changes. Diverticular disease of the sigmoid colon but
no acute inflammation.

Vascular/Lymphatic: Aortic atherosclerosis. No enlarged abdominal or
pelvic lymph nodes.

Reproductive: Enlarged prostate with calcification

Other: Negative for free air or free fluid

Musculoskeletal: Degenerative changes. No acute or suspicious bone
lesion.
IMPRESSION: 1. No CT evidence for acute intra-abdominal or pelvic pathology.
Negative for appendicitis
2. Gallstones
3. Mild hepatic steatosis
4. Slightly enlarged prostate gland with calcification
5. Sigmoid colon diverticular disease without acute inflammation

## 2017-04-04 MED ORDER — SODIUM CHLORIDE 0.9 % IV SOLN
Freq: Once | INTRAVENOUS | Status: AC
Start: 2017-04-04 — End: 2017-04-04
  Administered 2017-04-04: 17:00:00 via INTRAVENOUS

## 2017-04-04 MED ORDER — IOPAMIDOL (ISOVUE-300) INJECTION 61%
30.0000 mL | Freq: Once | INTRAVENOUS | Status: AC | PRN
  Administered 2017-04-04: 30 mL via ORAL

## 2017-04-04 MED ORDER — IOPAMIDOL (ISOVUE-300) INJECTION 61%
80.0000 mL | Freq: Once | INTRAVENOUS | Status: AC | PRN
  Administered 2017-04-04: 80 mL via INTRAVENOUS

## 2017-04-04 NOTE — Telephone Encounter (Signed)
Patient Name: Alex Smith DOB: [birth year] Initial Comment Caller states he is having pain in right side front and back. Nurse Assessment Nurse: Cherie Dark RN, Jarrett Soho Date/Time (Eastern Time): 04/04/2017 12:50:08 PM Confirm and document reason for call. If symptomatic, describe symptoms. ---Caller states he is having some pain in the side on the right. Flank pain. No pain when urinating. Having urgency with his bowels. Has accidents on himself sometimes. Diarrhea for a month. Pain is an 8 on 1 to 10 scale when he moves. Sitting there is dull pain but not bad. No fever Does the patient have any new or worsening symptoms? ---Yes Will a triage be completed? ---Yes Related visit to physician within the last 2 weeks? ---Yes Does the PT have any chronic conditions? (i.e. diabetes, asthma, etc.) ---Yes List chronic conditions. ---leukemia, is receiving revemed, diabetes, Is this a behavioral health or substance abuse call? ---No Guidelines Guideline Title Affirmed Question Affirmed Notes Flank Pain Weakness of a leg or foot (e.g., unable to bear weight, dragging foot) Final Disposition User Go to ED Now (or PCP triage) Cherie Dark, RN, Jarrett Soho Comments spoke with patient and informed him there are no appts available at either [facility] or [facility]. Patient aware and stated he would go to the ER. Patient encouraged to call back PRN. Referrals GO TO FACILITY UNDECIDED Disagree/Comply: Comply

## 2017-04-04 NOTE — ED Provider Notes (Signed)
[facility] Emergency Department Provider Note   ____________________________________________   First MD Initiated Contact with Patient 04/04/17 1631     (approximate)  I have reviewed the triage vital signs and the nursing notes.   HISTORY  Chief Complaint Abdominal Pain    HPI Alex Smith is a 82 y.o. male patient comes in complaining of right lower quadrant pain starting last night. It is sharp at times. It comes and goes. He has no nausea or vomiting. He has no dysuria. He's had no fever. He has had diarrhea twice today. He reports he's had this pain before this year but it was not diagnosed. He is not having any pain at present.   Past Medical History:  Diagnosis Date  . Anemia   . B12 deficiency   . Basal cell carcinoma of face   . BPH (benign prostatic hypertrophy)    followed by urology, discharged (Dr. Bernardo Heater)  . CAP (community acquired pneumonia) 02/15/2015  . Colon polyps   . Diverticulosis   . GERD (gastroesophageal reflux disease)   . 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 History of pneumonia 2000's   "once" (07/07/2012)  . History of shingles   . HLD (hyperlipidemia)   . Hypertension   . Pneumonia   . Shortness of breath dyspnea   . Systolic murmur 54/65/6812  . Type 2 diabetes, controlled, with retinopathy [facility])     Patient Active Problem List   Diagnosis Date Noted  . Other neutropenia ([place]) 11/17/2016  . Chemotherapy-induced neutropenia ([place]) 09/22/2016  . Urinary incontinence 08/02/2016  . Systolic murmur 75/17/0017  . Myelodysplastic syndrome, low grade ([place]) 02/05/2016  . Multiple myeloma not having achieved remission ([place]) 11/03/2015  . Myelodysplasia present in bone marrow ([place]) 11/03/2015  . Advanced care planning/counseling discussion 05/22/2015  . Insomnia 03/03/2015  . Monoclonal gammopathy 02/03/2015  . Macrocytic anemia 02/03/2015  . Obesity,  Class II, BMI 35-39.9, with comorbidity 10/12/2014  . H/O neoplasm 10/10/2014  . History of nonmelanoma skin cancer 10/10/2014  . Medicare annual wellness visit, subsequent 04/12/2014  . Health maintenance examination 04/12/2014  . Hairy cell leukemia, in remission ([place])   . B12 deficiency   . BPH with obstruction/lower urinary tract symptoms   . Essential hypertension   . GERD (gastroesophageal reflux disease)   . Dyslipidemia   . Type 2 diabetes, controlled, with retinopathy ([place])   . Diverticulosis   . Macular hole 07/07/2012    Past Surgical History:  Procedure Laterality Date  . [place] VITRECTOMY WITH 20 GAUGE MVR PORT FOR MACULAR HOLE  07/07/2012   Procedure: 25 GAUGE PARS PLANA VITRECTOMY WITH 20 GAUGE MVR PORT FOR MACULAR HOLE;  Surgeon: Hayden Pedro, MD;  Location: [place];  Service: Ophthalmology;  Laterality: Left;  . BONE MARROW BIOPSY  2016  . CARDIOVASCULAR STRESS TEST  2013   treadmill - no evidence ischemia, EF 61%  . CATARACT EXTRACTION W/ INTRAOCULAR LENS  IMPLANT, BILATERAL  ~ 2010  . COLONOSCOPY  2014   [place] WNL no rpt needed, h/o polyps  . EYE SURGERY Left 06/2012   laser surgery  . GAS INSERTION  07/07/2012   Procedure: INSERTION OF GAS;  Surgeon: Hayden Pedro, MD;  Location: [place];  Service: Ophthalmology;  Laterality: Left;  [id number]  . PERIPHERAL VASCULAR CATHETERIZATION N/A 02/23/2015   Procedure: Glori Luis Cath Insertion;  Surgeon: Algernon Huxley, MD;  Location: [place] [facility];  Service: Cardiovascular;  Laterality: N/A;  . SERUM PATCH  07/07/2012   Procedure: SERUM PATCH;  Surgeon: Hayden Pedro, MD;  Location: [place];  Service: Ophthalmology;  Laterality: Left;  . SKIN CANCER EXCISION     "all over my face" (07/07/2012)    Prior to Admission medications   Medication Sig Start Date End Date Taking? Authorizing Provider  acyclovir (ZOVIRAX) 400 MG tablet TAKE TWO TABLETS TWICE DAILY 11/05/16   Ria Bush, MD  aspirin EC 81 MG  tablet Take 81 mg by mouth daily.    [provider]  glucose blood (ACCU-CHEK AVIVA PLUS) test strip Use to check sugar twice daily Dx:E11.9 09/26/14   Ria Bush, MD  HUMULIN R 100 UNIT/ML injection INJECT 10 UNITS SUBCUTANEOUSLY TWICE DAILY BEFORE MEALS 07/23/16   Ria Bush, MD  hydrochlorothiazide (HYDRODIURIL) 25 MG tablet TAKE ONE TABLET EVERY DAY 10/07/16   Ria Bush, MD  insulin NPH Human (HUMULIN N) 100 UNIT/ML injection Inject 0.39 mLs (39 Units total) into the skin 2 (two) times daily before a meal. 12/12/16   Ria Bush, MD  lenalidomide (REVLIMID) 5 MG capsule 5 mg a day for 21 days then 7 days off 04/01/17   Lequita Asal, MD  lidocaine-prilocaine (EMLA) cream Apply 1 application topically as needed. 02/28/15   Lequita Asal, MD  lisinopril (PRINIVIL,ZESTRIL) 20 MG tablet TAKE 1 TABLET BY MOUTH TWICE DAILY 11/05/16   Ria Bush, MD  metFORMIN (GLUCOPHAGE) 1000 MG tablet TAKE ONE TABLET BY MOUTH TWICE DAILY AS DIRECTED 03/10/17   Ria Bush, MD  MICROLET LANCETS MISC USE AS DIRECTED 04/17/15   Ria Bush, MD  pravastatin (PRAVACHOL) 20 MG tablet TAKE ONE TABLET AT BEDTIME 11/05/16   Ria Bush, MD  ranitidine (ZANTAC) 150 MG tablet TAKE 1 TABLET BY MOUTH TWICE DAILY AS NEEDED 08/28/16   Ria Bush, MD  tamsulosin (FLOMAX) 0.4 MG CAPS capsule TAKE 1 CAPSULE BY MOUTH EVERY DAY 03/11/17   Ria Bush, MD  temazepam (RESTORIL) 7.5 MG capsule Take 7.5 mg by mouth once.  03/03/15   [provider]  traZODone (DESYREL) 50 MG tablet TAKE 1/2 TO 1 TABLET BY MOUTH AT [facility] NEEDED FOR SLEEP 01/23/17   Ria Bush, MD  vitamin E 400 UNIT capsule Take 200 Units by mouth daily.     [provider]    Allergies Rituximab; Blood-group specific substance; Primaxin [imipenem]; and Sulfa antibiotics  Family History  Problem Relation Age of Onset  . Dementia Mother   . Heart failure Father 4  .  Cancer Sister        breast  . Diabetes Paternal Uncle   . Diabetes Paternal Aunt   . CAD Brother 41       MI  . Stroke Neg Hx     Social History Social History  Substance Use Topics  . Smoking status: Former Smoker    Packs/day: 2.00    Years: 25.00    Types: Cigarettes    Quit date: 02/06/1969  . Smokeless tobacco: Never Used     Comment: 07/07/2012 "stopped smoking ~ 40 yr ago; smoked 20-45yr  . Alcohol use 0.0 oz/week     Comment: rare    Review of Systems  Constitutional: No fever/chills Eyes: No visual changes. ENT: No sore throat. Cardiovascular: Denies chest pain. Respiratory: Denies shortness of breath. Gastrointestinal: No abdominal pain.  No nausea, no vomiting. diarrhea.  No constipation. Genitourinary: Negative for dysuria. Musculoskeletal:  Negative for back pain. Skin: Negative for rash. Neurological: Negative for headaches, focal weakness or numbness.   ____________________________________________   PHYSICAL EXAM:  VITAL SIGNS: ED Triage Vitals  Enc Vitals Group     BP 04/04/17 1455 136/60     Pulse Rate 04/04/17 1455 84     Resp 04/04/17 1455 18     Temp 04/04/17 1455 98.5 F (36.9 C)     Temp Source 04/04/17 1455 Oral     SpO2 04/04/17 1455 97 %     Weight 04/04/17 1456 217 lb (98.4 kg)     Height 04/04/17 1456 5' 7"  (1.702 m)     Head Circumference --      Peak Flow --      Pain Score 04/04/17 1455 4     Pain Loc --      Pain Edu? --      Excl. in [place]? --     Constitutional: Alert and oriented. Well appearing and in no acute distress. Eyes: Conjunctivae are normal.  Head: Atraumatic. Nose: No congestion/rhinnorhea. Mouth/Throat: Mucous membranes are moist.  Oropharynx non-erythematous. Neck: No stridor.  Cardiovascular: Normal rate, regular rhythm. Grossly normal heart sounds.  Good peripheral circulation. Respiratory: Normal respiratory effort.  No retractions. Lungs CTAB. Gastrointestinal: Soft and nontender. No distention. No  abdominal bruits. No CVA tenderness. Musculoskeletal: No lower extremity tenderness nor edema.  . Neurologic:  Normal speech and language. No gross focal neurologic deficits are appreciated.  Skin:  Skin is warm, dry and intact. No rash noted. Psychiatric: Mood and affect are normal. Speech and behavior are normal.  ____________________________________________   LABS (all labs ordered are listed, but only abnormal results are displayed)  Labs Reviewed  COMPREHENSIVE METABOLIC PANEL - Abnormal; Notable for the following:       Result Value   Glucose, Bld 190 (*)    BUN 37 (*)    Creatinine, Ser 1.66 (*)    GFR calc non Af Amer 37 (*)    GFR calc Af Amer 43 (*)    All other components within normal limits  CBC - Abnormal; Notable for the following:    RBC 2.64 (*)    Hemoglobin 9.8 (*)    HCT 28.7 (*)    MCV 108.7 (*)    MCH 37.1 (*)    RDW 19.3 (*)    Platelets 94 (*)    All other components within normal limits  URINALYSIS, COMPLETE (UACMP) WITH MICROSCOPIC - Abnormal; Notable for the following:    Color, Urine YELLOW (*)    APPearance CLEAR (*)    Glucose, UA >=500 (*)    All other components within normal limits  LIPASE, BLOOD   ____________________________________________  EKG   ____________________________________________  RADIOLOGY  IMPRESSION: 1. No CT evidence for acute intra-abdominal or pelvic pathology. Negative for appendicitis 2. Gallstones 3. Mild hepatic steatosis 4. Slightly enlarged prostate gland with calcification 5. Sigmoid colon diverticular disease without acute inflammation   Electronically Signed   By: Donavan Foil M.D.   On: 04/04/2017 18:10  ____________________________________________   PROCEDURES  Procedure(s) performed:  Procedures  Critical Care performed:   ____________________________________________   INITIAL IMPRESSION / ASSESSMENT AND PLAN / ED COURSE  Pertinent labs & imaging results that were available  during my care of the patient were reviewed by me and considered in my medical decision making (see chart for details).  Discussed with patient labs and CT look okay. He still not tender on palpation. He does  report some pain in the right lower quadrant when he rolled over. No further episodes of diarrhea.      ____________________________________________   FINAL CLINICAL IMPRESSION(S) / ED DIAGNOSES  Final diagnoses:  Right lower quadrant abdominal pain      NEW MEDICATIONS STARTED DURING THIS VISIT:  New Prescriptions   No medications on file     Note:  This document was prepared using Dragon voice recognition software and may include unintentional dictation errors.    Nena Polio, MD 04/04/17 [phone]

## 2017-04-04 NOTE — Discharge Instructions (Signed)
Please return for worse pain fever vomiting. If he had another episode of diarrhea he can try some Pepto-Bismol. If you have more diarrhea than 1 or 2 more episodes please return or see her doctor in the next 3 days. Please also follow-up with your doctor in the next 2-3 days unless you're well. If you feel sicker in any way please return to the emergency room. All the tests here do not show any acute problem.

## 2017-04-04 NOTE — ED Triage Notes (Signed)
First Nurse Note:  Arrives from [facility] with c/o RLQ pain and diarrhea x 2 days.  Patient is AAOx3.  Skin warm and dry.  NAD.

## 2017-04-08 ENCOUNTER — Inpatient Hospital Stay: Payer: Medicare Other

## 2017-04-08 DIAGNOSIS — D709 Neutropenia, unspecified: Secondary | ICD-10-CM | POA: Diagnosis not present

## 2017-04-08 DIAGNOSIS — D708 Other neutropenia: Secondary | ICD-10-CM

## 2017-04-08 DIAGNOSIS — I10 Essential (primary) hypertension: Secondary | ICD-10-CM | POA: Diagnosis not present

## 2017-04-08 DIAGNOSIS — Z87891 Personal history of nicotine dependence: Secondary | ICD-10-CM | POA: Diagnosis not present

## 2017-04-08 DIAGNOSIS — Z8719 Personal history of other diseases of the digestive system: Secondary | ICD-10-CM | POA: Diagnosis not present

## 2017-04-08 DIAGNOSIS — Z85828 Personal history of other malignant neoplasm of skin: Secondary | ICD-10-CM | POA: Diagnosis not present

## 2017-04-08 DIAGNOSIS — E119 Type 2 diabetes mellitus without complications: Secondary | ICD-10-CM | POA: Diagnosis not present

## 2017-04-08 DIAGNOSIS — C9000 Multiple myeloma not having achieved remission: Secondary | ICD-10-CM

## 2017-04-08 DIAGNOSIS — Z7982 Long term (current) use of aspirin: Secondary | ICD-10-CM | POA: Diagnosis not present

## 2017-04-08 DIAGNOSIS — C9141 Hairy cell leukemia, in remission: Secondary | ICD-10-CM | POA: Diagnosis not present

## 2017-04-08 DIAGNOSIS — E538 Deficiency of other specified B group vitamins: Secondary | ICD-10-CM | POA: Diagnosis not present

## 2017-04-08 DIAGNOSIS — Z7984 Long term (current) use of oral hypoglycemic drugs: Secondary | ICD-10-CM | POA: Diagnosis not present

## 2017-04-08 DIAGNOSIS — N400 Enlarged prostate without lower urinary tract symptoms: Secondary | ICD-10-CM | POA: Diagnosis not present

## 2017-04-08 DIAGNOSIS — Z9221 Personal history of antineoplastic chemotherapy: Secondary | ICD-10-CM | POA: Diagnosis not present

## 2017-04-08 DIAGNOSIS — D4620 Refractory anemia with excess of blasts, unspecified: Secondary | ICD-10-CM | POA: Diagnosis not present

## 2017-04-08 DIAGNOSIS — Z86010 Personal history of colonic polyps: Secondary | ICD-10-CM | POA: Diagnosis not present

## 2017-04-08 DIAGNOSIS — Z8619 Personal history of other infectious and parasitic diseases: Secondary | ICD-10-CM | POA: Diagnosis not present

## 2017-04-08 LAB — CBC WITH DIFFERENTIAL/PLATELET
Basophils Absolute: 0 10*3/uL (ref 0–0.1)
Basophils Relative: 1 %
Eosinophils Absolute: 0.1 10*3/uL (ref 0–0.7)
Eosinophils Relative: 3 %
HCT: 26.3 % — ABNORMAL LOW (ref 40.0–52.0)
Hemoglobin: 9.1 g/dL — ABNORMAL LOW (ref 13.0–18.0)
Lymphocytes Relative: 20 %
Lymphs Abs: 0.6 10*3/uL — ABNORMAL LOW (ref 1.0–3.6)
MCH: 37.5 pg — ABNORMAL HIGH (ref 26.0–34.0)
MCHC: 34.8 g/dL (ref 32.0–36.0)
MCV: 107.9 fL — ABNORMAL HIGH (ref 80.0–100.0)
Monocytes Absolute: 0.3 10*3/uL (ref 0.2–1.0)
Monocytes Relative: 11 %
Neutro Abs: 2 10*3/uL (ref 1.4–6.5)
Neutrophils Relative %: 65 %
Platelets: 95 10*3/uL — ABNORMAL LOW (ref 150–440)
RBC: 2.43 MIL/uL — ABNORMAL LOW (ref 4.40–5.90)
RDW: 18.5 % — ABNORMAL HIGH (ref 11.5–14.5)
WBC: 3 10*3/uL — ABNORMAL LOW (ref 3.8–10.6)

## 2017-04-10 NOTE — Telephone Encounter (Signed)
Dr. Darnell Level  I have spoken with the pt and he states he does not have any discomfort at all. He is aware to update [place] if anything changes.

## 2017-04-10 NOTE — Telephone Encounter (Signed)
Patient returned Jasmine's call.  Patient can be reached at [phone].

## 2017-04-10 NOTE — Telephone Encounter (Signed)
Left message on machine to call back  

## 2017-04-10 NOTE — Telephone Encounter (Signed)
Seen at ER with unrevealing evaluation. plz call for f/u. If ongoing discomfort, rec schedule office visit for further eval.

## 2017-04-13 ENCOUNTER — Encounter: Payer: Self-pay | Admitting: Hematology and Oncology

## 2017-04-14 ENCOUNTER — Other Ambulatory Visit: Payer: Self-pay | Admitting: Family Medicine

## 2017-04-14 MED ORDER — HYDROCHLOROTHIAZIDE 25 MG PO TABS: 25.0000 mg | ORAL_TABLET | Freq: Every day | ORAL | 2 refills | Status: DC

## 2017-04-14 MED ORDER — HYDROCHLOROTHIAZIDE 25 MG PO TABS: 25.0000 mg | ORAL_TABLET | Freq: Every day | ORAL | 6 refills | Status: DC

## 2017-04-14 NOTE — Telephone Encounter (Signed)
plz phone in as this doesn't seem to be going through.

## 2017-04-14 NOTE — Addendum Note (Signed)
Addended by: Ria Bush on: 04/14/2017 02:03 PM   Modules accepted: Orders

## 2017-04-14 NOTE — Addendum Note (Signed)
Addended by: Modena Nunnery on: 04/14/2017 02:41 PM   Modules accepted: Orders

## 2017-04-15 ENCOUNTER — Inpatient Hospital Stay: Payer: Medicare Other

## 2017-04-15 DIAGNOSIS — E119 Type 2 diabetes mellitus without complications: Secondary | ICD-10-CM | POA: Diagnosis not present

## 2017-04-15 DIAGNOSIS — N400 Enlarged prostate without lower urinary tract symptoms: Secondary | ICD-10-CM | POA: Diagnosis not present

## 2017-04-15 DIAGNOSIS — D4620 Refractory anemia with excess of blasts, unspecified: Secondary | ICD-10-CM | POA: Diagnosis not present

## 2017-04-15 DIAGNOSIS — T451X5A Adverse effect of antineoplastic and immunosuppressive drugs, initial encounter: Secondary | ICD-10-CM

## 2017-04-15 DIAGNOSIS — C9000 Multiple myeloma not having achieved remission: Secondary | ICD-10-CM | POA: Diagnosis not present

## 2017-04-15 DIAGNOSIS — Z8719 Personal history of other diseases of the digestive system: Secondary | ICD-10-CM | POA: Diagnosis not present

## 2017-04-15 DIAGNOSIS — C9141 Hairy cell leukemia, in remission: Secondary | ICD-10-CM

## 2017-04-15 DIAGNOSIS — D701 Agranulocytosis secondary to cancer chemotherapy: Secondary | ICD-10-CM

## 2017-04-15 DIAGNOSIS — Z85828 Personal history of other malignant neoplasm of skin: Secondary | ICD-10-CM | POA: Diagnosis not present

## 2017-04-15 DIAGNOSIS — Z7982 Long term (current) use of aspirin: Secondary | ICD-10-CM | POA: Diagnosis not present

## 2017-04-15 DIAGNOSIS — Z8619 Personal history of other infectious and parasitic diseases: Secondary | ICD-10-CM | POA: Diagnosis not present

## 2017-04-15 DIAGNOSIS — Z9221 Personal history of antineoplastic chemotherapy: Secondary | ICD-10-CM | POA: Diagnosis not present

## 2017-04-15 DIAGNOSIS — E538 Deficiency of other specified B group vitamins: Secondary | ICD-10-CM

## 2017-04-15 DIAGNOSIS — Z7984 Long term (current) use of oral hypoglycemic drugs: Secondary | ICD-10-CM | POA: Diagnosis not present

## 2017-04-15 DIAGNOSIS — Z86010 Personal history of colonic polyps: Secondary | ICD-10-CM | POA: Diagnosis not present

## 2017-04-15 DIAGNOSIS — Z87891 Personal history of nicotine dependence: Secondary | ICD-10-CM | POA: Diagnosis not present

## 2017-04-15 DIAGNOSIS — D709 Neutropenia, unspecified: Secondary | ICD-10-CM | POA: Diagnosis not present

## 2017-04-15 DIAGNOSIS — I10 Essential (primary) hypertension: Secondary | ICD-10-CM | POA: Diagnosis not present

## 2017-04-15 LAB — CBC WITH DIFFERENTIAL/PLATELET
Basophils Absolute: 0 10*3/uL (ref 0–0.1)
Basophils Relative: 1 %
Eosinophils Absolute: 0.2 10*3/uL (ref 0–0.7)
Eosinophils Relative: 3 %
HCT: 27.7 % — ABNORMAL LOW (ref 40.0–52.0)
Hemoglobin: 9.7 g/dL — ABNORMAL LOW (ref 13.0–18.0)
Lymphocytes Relative: 13 %
Lymphs Abs: 0.7 10*3/uL — ABNORMAL LOW (ref 1.0–3.6)
MCH: 37.6 pg — ABNORMAL HIGH (ref 26.0–34.0)
MCHC: 34.9 g/dL (ref 32.0–36.0)
MCV: 107.9 fL — ABNORMAL HIGH (ref 80.0–100.0)
Monocytes Absolute: 0.5 10*3/uL (ref 0.2–1.0)
Monocytes Relative: 9 %
Neutro Abs: 4.2 10*3/uL (ref 1.4–6.5)
Neutrophils Relative %: 74 %
Platelets: 101 10*3/uL — ABNORMAL LOW (ref 150–440)
RBC: 2.57 MIL/uL — ABNORMAL LOW (ref 4.40–5.90)
RDW: 17.3 % — ABNORMAL HIGH (ref 11.5–14.5)
WBC: 5.7 10*3/uL (ref 3.8–10.6)

## 2017-04-22 ENCOUNTER — Inpatient Hospital Stay: Payer: Medicare Other

## 2017-04-22 ENCOUNTER — Other Ambulatory Visit: Payer: Self-pay | Admitting: *Deleted

## 2017-04-22 ENCOUNTER — Other Ambulatory Visit: Payer: Self-pay | Admitting: Hematology and Oncology

## 2017-04-22 ENCOUNTER — Telehealth: Payer: Self-pay | Admitting: *Deleted

## 2017-04-22 ENCOUNTER — Inpatient Hospital Stay: Payer: Medicare Other | Attending: Hematology and Oncology

## 2017-04-22 DIAGNOSIS — Z79899 Other long term (current) drug therapy: Secondary | ICD-10-CM | POA: Insufficient documentation

## 2017-04-22 DIAGNOSIS — Z87891 Personal history of nicotine dependence: Secondary | ICD-10-CM | POA: Diagnosis not present

## 2017-04-22 DIAGNOSIS — E538 Deficiency of other specified B group vitamins: Secondary | ICD-10-CM | POA: Insufficient documentation

## 2017-04-22 DIAGNOSIS — C9141 Hairy cell leukemia, in remission: Secondary | ICD-10-CM

## 2017-04-22 DIAGNOSIS — N400 Enlarged prostate without lower urinary tract symptoms: Secondary | ICD-10-CM | POA: Insufficient documentation

## 2017-04-22 DIAGNOSIS — Z86010 Personal history of colonic polyps: Secondary | ICD-10-CM | POA: Insufficient documentation

## 2017-04-22 DIAGNOSIS — D4620 Refractory anemia with excess of blasts, unspecified: Secondary | ICD-10-CM | POA: Insufficient documentation

## 2017-04-22 DIAGNOSIS — Z7982 Long term (current) use of aspirin: Secondary | ICD-10-CM | POA: Diagnosis not present

## 2017-04-22 DIAGNOSIS — I10 Essential (primary) hypertension: Secondary | ICD-10-CM | POA: Diagnosis not present

## 2017-04-22 DIAGNOSIS — D649 Anemia, unspecified: Secondary | ICD-10-CM | POA: Diagnosis not present

## 2017-04-22 DIAGNOSIS — Z8619 Personal history of other infectious and parasitic diseases: Secondary | ICD-10-CM | POA: Insufficient documentation

## 2017-04-22 DIAGNOSIS — Z85828 Personal history of other malignant neoplasm of skin: Secondary | ICD-10-CM | POA: Insufficient documentation

## 2017-04-22 DIAGNOSIS — E785 Hyperlipidemia, unspecified: Secondary | ICD-10-CM | POA: Diagnosis not present

## 2017-04-22 DIAGNOSIS — R5383 Other fatigue: Secondary | ICD-10-CM | POA: Diagnosis not present

## 2017-04-22 DIAGNOSIS — Z7984 Long term (current) use of oral hypoglycemic drugs: Secondary | ICD-10-CM | POA: Insufficient documentation

## 2017-04-22 DIAGNOSIS — K219 Gastro-esophageal reflux disease without esophagitis: Secondary | ICD-10-CM | POA: Insufficient documentation

## 2017-04-22 DIAGNOSIS — E11319 Type 2 diabetes mellitus with unspecified diabetic retinopathy without macular edema: Secondary | ICD-10-CM | POA: Diagnosis not present

## 2017-04-22 DIAGNOSIS — Z8701 Personal history of pneumonia (recurrent): Secondary | ICD-10-CM | POA: Insufficient documentation

## 2017-04-22 DIAGNOSIS — C9000 Multiple myeloma not having achieved remission: Secondary | ICD-10-CM | POA: Diagnosis not present

## 2017-04-22 DIAGNOSIS — Z7689 Persons encountering health services in other specified circumstances: Secondary | ICD-10-CM | POA: Insufficient documentation

## 2017-04-22 DIAGNOSIS — D708 Other neutropenia: Secondary | ICD-10-CM

## 2017-04-22 DIAGNOSIS — D72819 Decreased white blood cell count, unspecified: Secondary | ICD-10-CM | POA: Diagnosis not present

## 2017-04-22 LAB — CBC WITH DIFFERENTIAL/PLATELET
Basophils Absolute: 0 10*3/uL (ref 0–0.1)
Basophils Relative: 0 %
Eosinophils Absolute: 0.1 10*3/uL (ref 0–0.7)
Eosinophils Relative: 4 %
HCT: 25.4 % — ABNORMAL LOW (ref 40.0–52.0)
Hemoglobin: 9 g/dL — ABNORMAL LOW (ref 13.0–18.0)
Lymphocytes Relative: 18 %
Lymphs Abs: 0.5 10*3/uL — ABNORMAL LOW (ref 1.0–3.6)
MCH: 37.7 pg — ABNORMAL HIGH (ref 26.0–34.0)
MCHC: 35.5 g/dL (ref 32.0–36.0)
MCV: 106.2 fL — ABNORMAL HIGH (ref 80.0–100.0)
Monocytes Absolute: 0.3 10*3/uL (ref 0.2–1.0)
Monocytes Relative: 9 %
Neutro Abs: 2.1 10*3/uL (ref 1.4–6.5)
Neutrophils Relative %: 69 %
Platelets: 88 10*3/uL — ABNORMAL LOW (ref 150–440)
RBC: 2.39 MIL/uL — ABNORMAL LOW (ref 4.40–5.90)
RDW: 17.2 % — ABNORMAL HIGH (ref 11.5–14.5)
WBC: 3.1 10*3/uL — ABNORMAL LOW (ref 3.8–10.6)

## 2017-04-22 MED ORDER — LENALIDOMIDE 5 MG PO CAPS: ORAL_CAPSULE | ORAL | 0 refills | Status: DC

## 2017-04-22 NOTE — Telephone Encounter (Signed)
Called patient to ask if he has gotten his appointment with Dr. [name].  He states he has not.

## 2017-04-29 ENCOUNTER — Inpatient Hospital Stay: Payer: Medicare Other

## 2017-04-29 ENCOUNTER — Encounter: Payer: Self-pay | Admitting: Hematology and Oncology

## 2017-04-29 ENCOUNTER — Inpatient Hospital Stay (HOSPITAL_BASED_OUTPATIENT_CLINIC_OR_DEPARTMENT_OTHER): Payer: Medicare Other | Admitting: Hematology and Oncology

## 2017-04-29 ENCOUNTER — Other Ambulatory Visit: Payer: Self-pay | Admitting: *Deleted

## 2017-04-29 VITALS — BP 128/68 | HR 72 | Temp 98.3°F | Resp 18 | Wt 218.6 lb

## 2017-04-29 DIAGNOSIS — Z87891 Personal history of nicotine dependence: Secondary | ICD-10-CM

## 2017-04-29 DIAGNOSIS — D4620 Refractory anemia with excess of blasts, unspecified: Secondary | ICD-10-CM

## 2017-04-29 DIAGNOSIS — E785 Hyperlipidemia, unspecified: Secondary | ICD-10-CM | POA: Diagnosis not present

## 2017-04-29 DIAGNOSIS — E11319 Type 2 diabetes mellitus with unspecified diabetic retinopathy without macular edema: Secondary | ICD-10-CM

## 2017-04-29 DIAGNOSIS — C9000 Multiple myeloma not having achieved remission: Secondary | ICD-10-CM

## 2017-04-29 DIAGNOSIS — D649 Anemia, unspecified: Secondary | ICD-10-CM | POA: Diagnosis not present

## 2017-04-29 DIAGNOSIS — D72819 Decreased white blood cell count, unspecified: Secondary | ICD-10-CM | POA: Diagnosis not present

## 2017-04-29 DIAGNOSIS — Z8701 Personal history of pneumonia (recurrent): Secondary | ICD-10-CM

## 2017-04-29 DIAGNOSIS — I10 Essential (primary) hypertension: Secondary | ICD-10-CM

## 2017-04-29 DIAGNOSIS — K219 Gastro-esophageal reflux disease without esophagitis: Secondary | ICD-10-CM | POA: Diagnosis not present

## 2017-04-29 DIAGNOSIS — D46Z Other myelodysplastic syndromes: Secondary | ICD-10-CM

## 2017-04-29 DIAGNOSIS — Z95828 Presence of other vascular implants and grafts: Secondary | ICD-10-CM

## 2017-04-29 DIAGNOSIS — Z79899 Other long term (current) drug therapy: Secondary | ICD-10-CM | POA: Diagnosis not present

## 2017-04-29 DIAGNOSIS — Z8619 Personal history of other infectious and parasitic diseases: Secondary | ICD-10-CM | POA: Diagnosis not present

## 2017-04-29 DIAGNOSIS — Z7982 Long term (current) use of aspirin: Secondary | ICD-10-CM | POA: Diagnosis not present

## 2017-04-29 DIAGNOSIS — E538 Deficiency of other specified B group vitamins: Secondary | ICD-10-CM

## 2017-04-29 DIAGNOSIS — N400 Enlarged prostate without lower urinary tract symptoms: Secondary | ICD-10-CM | POA: Diagnosis not present

## 2017-04-29 DIAGNOSIS — Z86010 Personal history of colonic polyps: Secondary | ICD-10-CM | POA: Diagnosis not present

## 2017-04-29 DIAGNOSIS — D708 Other neutropenia: Secondary | ICD-10-CM

## 2017-04-29 DIAGNOSIS — Z7984 Long term (current) use of oral hypoglycemic drugs: Secondary | ICD-10-CM

## 2017-04-29 DIAGNOSIS — C9141 Hairy cell leukemia, in remission: Secondary | ICD-10-CM

## 2017-04-29 DIAGNOSIS — Z85828 Personal history of other malignant neoplasm of skin: Secondary | ICD-10-CM

## 2017-04-29 DIAGNOSIS — R5383 Other fatigue: Secondary | ICD-10-CM

## 2017-04-29 DIAGNOSIS — Z7689 Persons encountering health services in other specified circumstances: Secondary | ICD-10-CM | POA: Diagnosis not present

## 2017-04-29 DIAGNOSIS — D539 Nutritional anemia, unspecified: Secondary | ICD-10-CM

## 2017-04-29 LAB — COMPREHENSIVE METABOLIC PANEL
ALT: 21 U/L (ref 17–63)
AST: 25 U/L (ref 15–41)
Albumin: 3.3 g/dL — ABNORMAL LOW (ref 3.5–5.0)
Alkaline Phosphatase: 52 U/L (ref 38–126)
Anion gap: 6 (ref 5–15)
BUN: 34 mg/dL — ABNORMAL HIGH (ref 6–20)
CO2: 22 mmol/L (ref 22–32)
Calcium: 8.5 mg/dL — ABNORMAL LOW (ref 8.9–10.3)
Chloride: 108 mmol/L (ref 101–111)
Creatinine, Ser: 1.71 mg/dL — ABNORMAL HIGH (ref 0.61–1.24)
GFR calc Af Amer: 42 mL/min — ABNORMAL LOW (ref >60)
GFR calc non Af Amer: 36 mL/min — ABNORMAL LOW (ref >60)
Glucose, Bld: 175 mg/dL — ABNORMAL HIGH (ref 65–99)
Potassium: 4.1 mmol/L (ref 3.5–5.1)
Sodium: 136 mmol/L (ref 135–145)
Total Bilirubin: 0.7 mg/dL (ref 0.3–1.2)
Total Protein: 6 g/dL — ABNORMAL LOW (ref 6.5–8.1)

## 2017-04-29 LAB — CBC WITH DIFFERENTIAL/PLATELET
Basophils Absolute: 0 10*3/uL (ref 0–0.1)
Basophils Relative: 1 %
Eosinophils Absolute: 0.1 10*3/uL (ref 0–0.7)
Eosinophils Relative: 7 %
HCT: 23.9 % — ABNORMAL LOW (ref 40.0–52.0)
Hemoglobin: 8.4 g/dL — ABNORMAL LOW (ref 13.0–18.0)
Lymphocytes Relative: 22 %
Lymphs Abs: 0.4 10*3/uL — ABNORMAL LOW (ref 1.0–3.6)
MCH: 37.3 pg — ABNORMAL HIGH (ref 26.0–34.0)
MCHC: 35.1 g/dL (ref 32.0–36.0)
MCV: 106.1 fL — ABNORMAL HIGH (ref 80.0–100.0)
Monocytes Absolute: 0.1 10*3/uL — ABNORMAL LOW (ref 0.2–1.0)
Monocytes Relative: 6 %
Neutro Abs: 1.2 10*3/uL — ABNORMAL LOW (ref 1.4–6.5)
Neutrophils Relative %: 64 %
Platelets: 64 10*3/uL — ABNORMAL LOW (ref 150–440)
RBC: 2.25 MIL/uL — ABNORMAL LOW (ref 4.40–5.90)
RDW: 16.4 % — ABNORMAL HIGH (ref 11.5–14.5)
WBC: 2 10*3/uL — ABNORMAL LOW (ref 3.8–10.6)

## 2017-04-29 LAB — MAGNESIUM: Magnesium: 2 mg/dL (ref 1.7–2.4)

## 2017-04-29 MED ORDER — SODIUM CHLORIDE 0.9% FLUSH
10.0000 mL | INTRAVENOUS | Status: DC | PRN
  Administered 2017-04-29: 10 mL via INTRAVENOUS
  Filled 2017-04-29: qty 10

## 2017-04-29 MED ORDER — ACETAMINOPHEN 325 MG PO TABS: 650.0000 mg | ORAL_TABLET | Freq: Once | ORAL | Status: DC

## 2017-04-29 MED ORDER — SODIUM CHLORIDE 0.9% FLUSH
3.0000 mL | INTRAVENOUS | Status: DC | PRN
  Filled 2017-04-29: qty 3

## 2017-04-29 MED ORDER — SODIUM CHLORIDE 0.9 % IV SOLN
250.0000 mL | Freq: Once | INTRAVENOUS | Status: DC
  Filled 2017-04-29: qty 250

## 2017-04-29 MED ORDER — HEPARIN SOD (PORK) LOCK FLUSH 100 UNIT/ML IV SOLN: 500.0000 [IU] | Freq: Every day | INTRAVENOUS | Status: DC | PRN

## 2017-04-29 MED ORDER — HEPARIN SOD (PORK) LOCK FLUSH 100 UNIT/ML IV SOLN: 250.0000 [IU] | INTRAVENOUS | Status: DC | PRN

## 2017-04-29 MED ORDER — TBO-FILGRASTIM 480 MCG/0.8ML ~~LOC~~ SOSY
480.0000 ug | PREFILLED_SYRINGE | Freq: Once | SUBCUTANEOUS | Status: AC
  Administered 2017-04-29: 480 ug via SUBCUTANEOUS
  Filled 2017-04-29: qty 0.8

## 2017-04-29 MED ORDER — FILGRASTIM 480 MCG/0.8ML IJ SOSY
480.0000 ug | PREFILLED_SYRINGE | Freq: Once | INTRAMUSCULAR | Status: DC
  Filled 2017-04-29: qty 0.8

## 2017-04-29 MED ORDER — DIPHENHYDRAMINE HCL 25 MG PO CAPS: 25.0000 mg | ORAL_CAPSULE | Freq: Once | ORAL | Status: DC

## 2017-04-29 MED ORDER — SODIUM CHLORIDE 0.9% FLUSH
10.0000 mL | INTRAVENOUS | Status: DC | PRN
  Filled 2017-04-29: qty 10

## 2017-04-29 MED ORDER — HEPARIN SOD (PORK) LOCK FLUSH 100 UNIT/ML IV SOLN
500.0000 [IU] | Freq: Once | INTRAVENOUS | Status: AC
  Administered 2017-04-29: 500 [IU] via INTRAVENOUS

## 2017-04-29 NOTE — Progress Notes (Signed)
Patient offers no complaints today. 

## 2017-04-29 NOTE — Progress Notes (Signed)
[facility] day:  04/29/2017    Chief Complaint: Alex Smith is a 82 y.o. male with a myelodysplastic marrow, smoldering myeloma, and a history of hairy cell leukemia who is seen for 1 month assessment.  HPI:  The patient was last seen in the medical oncology clinic on 04/01/2017.  At that time, he felt "ok".  Exam was stable.  Counts included a hematocrit 25.5, hemoglobin of 8.9, platelets 79,000, and WBC 2200 with ANC 1300.  He restarted Revlimid.  We discussed possible reinstitution of Procrit.  He received GCSF.  He has had counts followed weekly.  He has not received GCSF as his ANC has been > 1500.  CBC on 04/22/2017 revealed a hematocrit of 25.4, hemoglobin 9, MCV 106.2, platelets 88,000, white count 3100 with an Virgie of 2100.  Symptomatically, he is fatigued.  He denies chest pain and shortness of breath. Patient scheduled to see Dr. Evelene Croon on 05/12/2017. Patient is currently off of his Revlimid (last day was 04/26/2017). Patient with no fevers, sweats, weight loss, and infections. He continues on ASA as directed.    Past Medical History:  Diagnosis Date  . Anemia   . B12 deficiency   . Basal cell carcinoma of face   . BPH (benign prostatic hypertrophy)    followed by urology, discharged (Dr. Bernardo Heater)  . CAP (community acquired pneumonia) 02/15/2015  . Colon polyps   . Diverticulosis   . GERD (gastroesophageal reflux disease)   . Hairy cell leukemia ([place]) 2006   recurrent, seizure on rituxan, now on cladribine ([place] Doane)  . History of pneumonia 2000's   "once" (07/07/2012)  . History of shingles   . HLD (hyperlipidemia)   . Hypertension   . Pneumonia   . Shortness of breath dyspnea   . Systolic murmur 27/74/1287  . Type 2 diabetes, controlled, with retinopathy ([place])     Past Surgical History:  Procedure Laterality Date  . [place] VITRECTOMY WITH 20 GAUGE MVR PORT FOR MACULAR HOLE  07/07/2012   Procedure: 25 GAUGE  PARS PLANA VITRECTOMY WITH 20 GAUGE MVR PORT FOR MACULAR HOLE;  Surgeon: Hayden Pedro, MD;  Location: [facility];  Service: Ophthalmology;  Laterality: Left;  . BONE MARROW BIOPSY  2016  . CARDIOVASCULAR STRESS TEST  2013   treadmill - no evidence ischemia, EF 61%  . CATARACT EXTRACTION W/ INTRAOCULAR LENS  IMPLANT, BILATERAL  ~ 2010  . COLONOSCOPY  2014   [place] WNL no rpt needed, h/o polyps  . EYE SURGERY Left 06/2012   laser surgery  . GAS INSERTION  07/07/2012   Procedure: INSERTION OF GAS;  Surgeon: Hayden Pedro, MD;  Location: [facility];  Service: Ophthalmology;  Laterality: Left;  [id number]  . PERIPHERAL VASCULAR CATHETERIZATION N/A 02/23/2015   Procedure: Glori Luis Cath Insertion;  Surgeon: Algernon Huxley, MD;  Location: [facility];  Service: Cardiovascular;  Laterality: N/A;  . SERUM PATCH  07/07/2012   Procedure: SERUM PATCH;  Surgeon: Hayden Pedro, MD;  Location: [facility];  Service: Ophthalmology;  Laterality: Left;  . SKIN CANCER EXCISION     "all over my face" (07/07/2012)    Family History  Problem Relation Age of Onset  . Dementia Mother   . Heart failure Father 31  . Cancer Sister        breast  . Diabetes Paternal Uncle   . Diabetes Paternal Aunt   . CAD Brother 70  MI  . Stroke Neg Hx     Social History:  reports that he quit smoking about 48 years ago. His smoking use included Cigarettes. He has a 50.00 pack-year smoking history. He has never used smokeless tobacco. He reports that he drinks alcohol. He reports that he does not use drugs.  He works at [employer] 20-32 hours/week.  The patient 's wife has had 3 hip operations.  She underwent hip replacement 03/06/2016.  His wife is now in a nursing home.  He states that they either eat out or bring food in.  His best contact number is his cell: [phone].  He is alone today.  Allergies:  Allergies  Allergen Reactions  . Rituximab Rash    Chest tightness  . Blood-Group Specific Substance Other (See Comments)     Had a post transfusion reaction of red blood cells; NOW REQUIRES WASHED BLOOD CELLS  . Primaxin [Imipenem] Other (See Comments)    Possible allergy  . Sulfa Antibiotics Itching and Rash  Possible allergy to Zoloft, allopurinol, and a chemotherapy medication.  He tolerates Dapsone.  Current Medications: Current Outpatient Prescriptions  Medication Sig Dispense Refill  . acyclovir (ZOVIRAX) 400 MG tablet TAKE TWO TABLETS TWICE DAILY 120 tablet 6  . aspirin EC 81 MG tablet Take 81 mg by mouth daily.    Marland Kitchen glucose blood (ACCU-CHEK AVIVA PLUS) test strip Use to check sugar twice daily Dx:E11.9 200 each 3  . HUMULIN R 100 UNIT/ML injection INJECT 10 UNITS SUBCUTANEOUSLY TWICE DAILY BEFORE MEALS 10 mL 6  . hydrochlorothiazide (HYDRODIURIL) 25 MG tablet Take 1 tablet (25 mg total) by mouth daily. 30 tablet 6  . hydrochlorothiazide (HYDRODIURIL) 25 MG tablet Take 1 tablet (25 mg total) by mouth daily. 30 tablet 2  . insulin NPH Human (HUMULIN N) 100 UNIT/ML injection Inject 0.39 mLs (39 Units total) into the skin 2 (two) times daily before a meal. 20 mL 6  . lenalidomide (REVLIMID) 5 MG capsule 5 mg a day for 21 days then 7 days off 21 capsule 0  . lidocaine-prilocaine (EMLA) cream Apply 1 application topically as needed. 30 g 3  . lisinopril (PRINIVIL,ZESTRIL) 20 MG tablet TAKE 1 TABLET BY MOUTH TWICE DAILY 60 tablet 6  . metFORMIN (GLUCOPHAGE) 1000 MG tablet TAKE ONE TABLET BY MOUTH TWICE DAILY AS DIRECTED 180 tablet 1  . MICROLET LANCETS MISC USE AS DIRECTED 200 each 3  . pravastatin (PRAVACHOL) 20 MG tablet TAKE ONE TABLET AT BEDTIME 30 tablet 6  . ranitidine (ZANTAC) 150 MG tablet TAKE 1 TABLET BY MOUTH TWICE DAILY AS NEEDED 180 tablet 3  . tamsulosin (FLOMAX) 0.4 MG CAPS capsule TAKE 1 CAPSULE BY MOUTH EVERY DAY 30 capsule 2  . temazepam (RESTORIL) 7.5 MG capsule Take 7.5 mg by mouth once.     . traZODone (DESYREL) 50 MG tablet TAKE 1/2 TO 1 TABLET BY MOUTH AT BEDTIMEAS NEEDED FOR SLEEP 30  tablet 0  . vitamin E 400 UNIT capsule Take 200 Units by mouth daily.      Current Facility-Administered Medications  Medication Dose Route Frequency Provider Last Rate Last Dose  . Tbo-Filgrastim (GRANIX) injection 480 mcg  480 mcg Subcutaneous Once Lequita Asal, MD       Facility-Administered Medications Ordered in Other Visits  Medication Dose Route Frequency Provider Last Rate Last Dose  . heparin lock flush 100 unit/mL  500 Units Intravenous Once Acelynn Dejonge C, MD      . sodium chloride 0.9 %  injection 10 mL  10 mL Intravenous PRN Lequita Asal, MD   10 mL at 03/03/15 0903  . sodium chloride 0.9 % injection 10 mL  10 mL Intracatheter PRN Nolon Stalls C, MD   10 mL at 03/10/15 1410  . sodium chloride flush (NS) 0.9 % injection 10 mL  10 mL Intravenous PRN Lequita Asal, MD   10 mL at 04/29/17 1110   Review of Systems:  GENERAL:  Feels "ok".  No fevers or sweats.  Weight up 1 pound. PERFORMANCE STATUS (ECOG):  1 HEENT: No visual changes, runny nose, sore throat. Pulmonary:  Chronic shortness of breath on exertion.  No cough.  No hemoptysis. Cardiac:  No chest pain, palpitations, orthopnea, or PND. GI:  Appetite good.  No nausea, vomiting, diarrhea, constipation, melena or hematochezia.  GU:  No urgency, frequency, dysuria, or hematuria.  On Flomax. Musculoskeletal:  No back pain.  No joint pain.  No muscle tenderness. Extremities:  No pain or swelling. Skin:  No rashes or skin changes. Neuro:  No headache, numbness or weakness, balance or coordination issues. Endocrine:  Diabetes.  No thyroid issues, hot flashes or night sweats. Psych:  No mood changes, depression or anxiety.  Pain:  No focal pain. Review of systems:  All other systems reviewed and found to be negative.   Physical Exam: Blood pressure 128/68, pulse 72, temperature 98.3 F (36.8 C), temperature source Tympanic, resp. rate 18, weight 218 lb 9 oz (99.1 kg).  GENERAL:  Well developed,  well nourished, gentleman sitting comfortably in the exam room in no acute distress. MENTAL STATUS:  Alert and oriented to person, place and time. HEAD:  Pearline Cables white hair.  Normocephalic, atraumatic, face symmetric, no Cushingoid features. EYES:  Glasses. Blue eyes.  Pupils equal round and reactive to light and accomodation.  No conjunctivitis or scleral icterus. ENT:  Oropharynx clear without lesion.  Tongue normal. Mucous membranes moist.  RESPIRATORY:  Clear to auscultation without rales, wheezes or rhonchi. CARDIOVASCULAR:  Regular rate and rhythm without murmur, rub or gallop. ABDOMEN:  Soft, non-tender, with active bowel sounds, and no hepatosplenomegaly.  No masses. SKIN:  No rashes or ulcers. EXTREMITIES: No edema, no skin discoloration or tenderness.  No palpable cords. LYMPH NODES: No palpable cervical, supraclavicular, axillary or inguinal adenopathy  NEUROLOGICAL: Unremarkable. PSYCH:  Appropriate.    Infusion on 04/29/2017  Component Date Value Ref Range Status  . WBC 04/29/2017 2.0* 3.8 - 10.6 K/uL Final  . RBC 04/29/2017 2.25* 4.40 - 5.90 MIL/uL Final  . Hemoglobin 04/29/2017 8.4* 13.0 - 18.0 g/dL Final  . HCT 04/29/2017 23.9* 40.0 - 52.0 % Final  . MCV 04/29/2017 106.1* 80.0 - 100.0 fL Final  . MCH 04/29/2017 37.3* 26.0 - 34.0 pg Final  . MCHC 04/29/2017 35.1  32.0 - 36.0 g/dL Final  . RDW 04/29/2017 16.4* 11.5 - 14.5 % Final  . Platelets 04/29/2017 64* 150 - 440 K/uL Final  . Neutrophils Relative % 04/29/2017 64  % Final  . Neutro Abs 04/29/2017 1.2* 1.4 - 6.5 K/uL Final  . Lymphocytes Relative 04/29/2017 22  % Final  . Lymphs Abs 04/29/2017 0.4* 1.0 - 3.6 K/uL Final  . Monocytes Relative 04/29/2017 6  % Final  . Monocytes Absolute 04/29/2017 0.1* 0.2 - 1.0 K/uL Final  . Eosinophils Relative 04/29/2017 7  % Final  . Eosinophils Absolute 04/29/2017 0.1  0 - 0.7 K/uL Final  . Basophils Relative 04/29/2017 1  % Final  . Basophils Absolute  04/29/2017 0.0  0 - 0.1 K/uL  Final    Assessment:  Alex Smith is a 82 y.o. 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  He was diagnosed with hairy cell leukemia in 2006.  He received cladribine 11/06-11/05/2005.  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   Bone marrow aspirate and biopsy on 02/07/2015 revealed a extensive marrow involvement by recurrent hairy cell leukemia (approximately 80% of cells in the core). There was a monoclonal plasma cell infiltrate (approximately 10%), compatible with a plasma cell neoplasm. Marrow was hypercellular (40-50%) with markedly diminished residual trilineage hematopoiesis and mild multilineage dyspoiesis.  Peripheral smear revealed leukopenia (WBC 1900) with 2% atypical lymphoid cells compatible with hairy cell leukemia. There was moderate anemia with anisopoikilocytosis. 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  FISH studies for MDS were negative.  Cytogenetics were normal (46, XY).  He has an unclear allergy history.  He may be allergic to Primaxin, Zoloft, allopurinol, and a chemotherapy medication.  Reaction occurred in 06/2005.  He is allergic to sulfa.  He is not allergic to dapsone.  He requires washed RBCs.  He is on prophylactic acyclovir.  Dapsone and voriconazole were discontinued.  He has a history of shingles prior to prophylactic acyclovir.  He is 17 months status post cladribine (began 03/03/2015).  His counts plateaued on 07/25/2015 and have subsequently decreased.  Labs on 07/25/2015 revealed a normal B12 (894), folate (60.5), and TSH (2.562).  Ferritin was 50 on 04/26/2016.  Labs on 08/22/2016 revealed a normal B12, folate, and TSH.  He is on oral B12.  Bone marrow on 11/03/2015 revealed persistent plasma cell neoplasm, now with mildy atypical monoclonal plasma cells estimated at 20-30%.   There was no morphologic evidence of residual hairy cell leukemia.  There was a  minute population (0.03%) with hairy cell immunophenotype detected by flow.  Marrow was normocellular for age with relative erythroid hyperplasia, relative myeloid hypoplasia, mild dyspoiesis, adequate megakaryocytes and no increased blasts.  There was no significant increase in marrow reticulin fibers.  Iron was present.  FISH studies for MDS were negative.  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  Cytogenetics were normal (46, XY).  Bone marrow on 03/13/2016 was normocellular to marginally hypercellular for age (20-40%) with relative erythroid hyperplasia and mild dyserythropoiesis, decreased granulopoiesis and adequate megakaryocytes.  There was a persistent 15-20% plasma cell neoplasm.  There was no overt increase in reticulin fibrosis.  Storage iron was present.  Flow cytometry revealed abnormal/monocytic plasma cell population (3% sample), consistent with a plasma cell neoplastic process.  Cytogenetics were normal (46, XY).  FISH studies for myeloma revealed a CCND1/IGH translocation, t(11;14) in 53% of nuclei.  SPEP has been followed: 1.0 on 02/03/2015, 1.4 gm/dL on 07/25/2015, 1.3 gm/dL on 11/17/2015, 1.2 gm/dL on 06/28/2016 and 1.0 gm/dL on 09/20/2016.    Kappa free light chains have been followed: 47.63 (ratio 3.12) on 02/03/2015, 54.9 (ratio 3.14) on 02/07/2015, 46.23 (ratio 3.68) on 07/25/2015, and 50.5 (ratio 4.82) on 11/17/2015.  Beta2-microglobulin was 2.5 on 11/17/2015.  24 hour urine on 11/21/2015 revealed 234.8 mg protein in 24 hours (no monoclonal protein).  Bone survey on 11/24/2015 revealed no lytic lesions.  Epo level was 329.6 on 03/29/2016.  He began a trial of Procrit.  He received 10,000 units of Procrit weekly (04/26/2016 - 06/14/2016).  Hemoglobin ranged between 8.4 and 8.9 without trend.  He is s/p 1 cycle ofRevlimid (10 mg) and Decadron(06/29/2016 - 07/19/2016). Cycle #1 was complicated by anemia and neutropenia. He  has received 4 units of PRBCs(1 unit:07/22/2016 and 2 units: 08/07/2016, 1 unit on 01/14/2017, and 1 unit on 02/27/2017). He received 6 days ofGCSF.  He is s/p 5 cycles of Revlimid (5 mg) and Decadron (09/14/2016 - 04/01/2017).  Revlimid was on temporary hold beginning 03/04/2017.  He began weekly GCSF on 11/05/2016.  He receives GCSF if his ANC is <= 1500.  He is on aspirin 81 mg a day prophylaxis.  PET scan on 03/14/2016 revealed no evidence of hypermetabolic lymphadenopathy or focal skeletal lesions to suggest marrow or cortical bone involvement.  Symptomatically, he feels fatiigued.  Exam is stable.  Counts include a hematocrit 23.9, hemoglobin of 8.4, platelets 64,000, and WBC 2000 with ANC 1200.  Plan: 1.  Labs today:  CBC with diff, CMP, Mg, SPEP, free light chains, type and screen 2.  Discuss anemia. Patient fatigued with no chest pain or shortness of breath.  Last PRBC transfusion was on 02/27/2017. 3.  Consider reintroduction of Procrit. 4.  Patient to follow-up with Dr. Evelene Croon on 05/12/2017. 5.  Hold Revlimid until further directed by medical oncology.  Anticipate restart next week.  6.  GCSF 480 mcg today. 7.  RTC tomorrow for PRBC transfusion.  Patient requests. 8.  RTC weekly x 3 for labs (CBC with diff) +/- GCSF  9.  RTC in 4 weeks for MD assessment, labs (CBC with diff, CMP, Mg) and +/- Neupogen.   Honor Loh, NP  04/29/2017, 11:29 AM   I saw and evaluated the patient, participating in the key portions of the service and reviewing pertinent diagnostic studies and records.  I reviewed the nurse practitioner's note and agree with the findings and the plan.  The assessment and plan were discussed with the patient.  Multiple questions were asked by the patient and answered.   Lequita Asal, MD  04/29/2017

## 2017-04-30 ENCOUNTER — Inpatient Hospital Stay: Payer: Medicare Other

## 2017-04-30 DIAGNOSIS — D649 Anemia, unspecified: Secondary | ICD-10-CM | POA: Diagnosis not present

## 2017-04-30 DIAGNOSIS — D72819 Decreased white blood cell count, unspecified: Secondary | ICD-10-CM | POA: Diagnosis not present

## 2017-04-30 DIAGNOSIS — K219 Gastro-esophageal reflux disease without esophagitis: Secondary | ICD-10-CM | POA: Diagnosis not present

## 2017-04-30 DIAGNOSIS — E785 Hyperlipidemia, unspecified: Secondary | ICD-10-CM | POA: Diagnosis not present

## 2017-04-30 DIAGNOSIS — D4620 Refractory anemia with excess of blasts, unspecified: Secondary | ICD-10-CM

## 2017-04-30 DIAGNOSIS — Z7982 Long term (current) use of aspirin: Secondary | ICD-10-CM | POA: Diagnosis not present

## 2017-04-30 DIAGNOSIS — I10 Essential (primary) hypertension: Secondary | ICD-10-CM | POA: Diagnosis not present

## 2017-04-30 DIAGNOSIS — Z86010 Personal history of colonic polyps: Secondary | ICD-10-CM | POA: Diagnosis not present

## 2017-04-30 DIAGNOSIS — C9141 Hairy cell leukemia, in remission: Secondary | ICD-10-CM | POA: Diagnosis not present

## 2017-04-30 DIAGNOSIS — Z85828 Personal history of other malignant neoplasm of skin: Secondary | ICD-10-CM | POA: Diagnosis not present

## 2017-04-30 DIAGNOSIS — Z7984 Long term (current) use of oral hypoglycemic drugs: Secondary | ICD-10-CM | POA: Diagnosis not present

## 2017-04-30 DIAGNOSIS — R5383 Other fatigue: Secondary | ICD-10-CM | POA: Diagnosis not present

## 2017-04-30 DIAGNOSIS — E11319 Type 2 diabetes mellitus with unspecified diabetic retinopathy without macular edema: Secondary | ICD-10-CM | POA: Diagnosis not present

## 2017-04-30 DIAGNOSIS — Z87891 Personal history of nicotine dependence: Secondary | ICD-10-CM | POA: Diagnosis not present

## 2017-04-30 DIAGNOSIS — Z8619 Personal history of other infectious and parasitic diseases: Secondary | ICD-10-CM | POA: Diagnosis not present

## 2017-04-30 DIAGNOSIS — N400 Enlarged prostate without lower urinary tract symptoms: Secondary | ICD-10-CM | POA: Diagnosis not present

## 2017-04-30 DIAGNOSIS — Z79899 Other long term (current) drug therapy: Secondary | ICD-10-CM | POA: Diagnosis not present

## 2017-04-30 DIAGNOSIS — C9000 Multiple myeloma not having achieved remission: Secondary | ICD-10-CM | POA: Diagnosis not present

## 2017-04-30 DIAGNOSIS — E538 Deficiency of other specified B group vitamins: Secondary | ICD-10-CM | POA: Diagnosis not present

## 2017-04-30 DIAGNOSIS — Z7689 Persons encountering health services in other specified circumstances: Secondary | ICD-10-CM | POA: Diagnosis not present

## 2017-04-30 LAB — PROTEIN ELECTROPHORESIS, SERUM
A/G Ratio: 1.2 (ref 0.7–1.7)
Albumin ELP: 3.2 g/dL (ref 2.9–4.4)
Alpha-1-Globulin: 0.2 g/dL (ref 0.0–0.4)
Alpha-2-Globulin: 0.7 g/dL (ref 0.4–1.0)
Beta Globulin: 0.8 g/dL (ref 0.7–1.3)
Gamma Globulin: 0.9 g/dL (ref 0.4–1.8)
Globulin, Total: 2.6 g/dL (ref 2.2–3.9)
M-Spike, %: 0.8 g/dL — ABNORMAL HIGH
Total Protein ELP: 5.8 g/dL — ABNORMAL LOW (ref 6.0–8.5)

## 2017-04-30 LAB — KAPPA/LAMBDA LIGHT CHAINS
Kappa free light chain: 104.6 mg/L — ABNORMAL HIGH (ref 3.3–19.4)
Kappa, lambda light chain ratio: 6.84 — ABNORMAL HIGH (ref 0.26–1.65)
Lambda free light chains: 15.3 mg/L (ref 5.7–26.3)

## 2017-04-30 LAB — PREPARE RBC (CROSSMATCH)

## 2017-04-30 MED ORDER — ACETAMINOPHEN 325 MG PO TABS
650.0000 mg | ORAL_TABLET | Freq: Once | ORAL | Status: AC
  Administered 2017-04-30: 650 mg via ORAL
  Filled 2017-04-30: qty 2

## 2017-04-30 MED ORDER — DIPHENHYDRAMINE HCL 25 MG PO CAPS
25.0000 mg | ORAL_CAPSULE | Freq: Once | ORAL | Status: AC
  Administered 2017-04-30: 25 mg via ORAL
  Filled 2017-04-30: qty 1

## 2017-04-30 MED ORDER — HEPARIN SOD (PORK) LOCK FLUSH 100 UNIT/ML IV SOLN: 500.0000 [IU] | Freq: Every day | INTRAVENOUS | Status: DC | PRN

## 2017-04-30 MED ORDER — SODIUM CHLORIDE 0.9 % IV SOLN
250.0000 mL | Freq: Once | INTRAVENOUS | Status: AC
  Administered 2017-04-30: 250 mL via INTRAVENOUS
  Filled 2017-04-30: qty 250

## 2017-04-30 MED ORDER — SODIUM CHLORIDE 0.9% FLUSH
10.0000 mL | INTRAVENOUS | Status: DC | PRN
  Filled 2017-04-30: qty 10

## 2017-05-01 LAB — TYPE AND SCREEN
ABO/RH(D): A NEG
Antibody Screen: NEGATIVE
Unit division: 0

## 2017-05-01 LAB — BPAM RBC
Blood Product Expiration Date: 201809121916
ISSUE DATE / TIME: 201809121132
Unit Type and Rh: 600

## 2017-05-06 ENCOUNTER — Inpatient Hospital Stay: Payer: Medicare Other

## 2017-05-06 ENCOUNTER — Telehealth: Payer: Self-pay | Admitting: *Deleted

## 2017-05-06 DIAGNOSIS — N400 Enlarged prostate without lower urinary tract symptoms: Secondary | ICD-10-CM | POA: Diagnosis not present

## 2017-05-06 DIAGNOSIS — R5383 Other fatigue: Secondary | ICD-10-CM | POA: Diagnosis not present

## 2017-05-06 DIAGNOSIS — E538 Deficiency of other specified B group vitamins: Secondary | ICD-10-CM | POA: Diagnosis not present

## 2017-05-06 DIAGNOSIS — Z8619 Personal history of other infectious and parasitic diseases: Secondary | ICD-10-CM | POA: Diagnosis not present

## 2017-05-06 DIAGNOSIS — Z86010 Personal history of colonic polyps: Secondary | ICD-10-CM | POA: Diagnosis not present

## 2017-05-06 DIAGNOSIS — Z7689 Persons encountering health services in other specified circumstances: Secondary | ICD-10-CM | POA: Diagnosis not present

## 2017-05-06 DIAGNOSIS — D46Z Other myelodysplastic syndromes: Secondary | ICD-10-CM

## 2017-05-06 DIAGNOSIS — E11319 Type 2 diabetes mellitus with unspecified diabetic retinopathy without macular edema: Secondary | ICD-10-CM | POA: Diagnosis not present

## 2017-05-06 DIAGNOSIS — Z79899 Other long term (current) drug therapy: Secondary | ICD-10-CM | POA: Diagnosis not present

## 2017-05-06 DIAGNOSIS — D72819 Decreased white blood cell count, unspecified: Secondary | ICD-10-CM | POA: Diagnosis not present

## 2017-05-06 DIAGNOSIS — C9000 Multiple myeloma not having achieved remission: Secondary | ICD-10-CM

## 2017-05-06 DIAGNOSIS — D4620 Refractory anemia with excess of blasts, unspecified: Secondary | ICD-10-CM | POA: Diagnosis not present

## 2017-05-06 DIAGNOSIS — Z87891 Personal history of nicotine dependence: Secondary | ICD-10-CM | POA: Diagnosis not present

## 2017-05-06 DIAGNOSIS — K219 Gastro-esophageal reflux disease without esophagitis: Secondary | ICD-10-CM | POA: Diagnosis not present

## 2017-05-06 DIAGNOSIS — D649 Anemia, unspecified: Secondary | ICD-10-CM | POA: Diagnosis not present

## 2017-05-06 DIAGNOSIS — I10 Essential (primary) hypertension: Secondary | ICD-10-CM | POA: Diagnosis not present

## 2017-05-06 DIAGNOSIS — E785 Hyperlipidemia, unspecified: Secondary | ICD-10-CM | POA: Diagnosis not present

## 2017-05-06 DIAGNOSIS — Z7984 Long term (current) use of oral hypoglycemic drugs: Secondary | ICD-10-CM | POA: Diagnosis not present

## 2017-05-06 DIAGNOSIS — Z7982 Long term (current) use of aspirin: Secondary | ICD-10-CM | POA: Diagnosis not present

## 2017-05-06 DIAGNOSIS — C9141 Hairy cell leukemia, in remission: Secondary | ICD-10-CM | POA: Diagnosis not present

## 2017-05-06 DIAGNOSIS — Z85828 Personal history of other malignant neoplasm of skin: Secondary | ICD-10-CM | POA: Diagnosis not present

## 2017-05-06 LAB — CBC WITH DIFFERENTIAL/PLATELET
Basophils Absolute: 0 10*3/uL (ref 0–0.1)
Basophils Relative: 2 %
Eosinophils Absolute: 0.1 10*3/uL (ref 0–0.7)
Eosinophils Relative: 6 %
HCT: 28.7 % — ABNORMAL LOW (ref 40.0–52.0)
Hemoglobin: 10 g/dL — ABNORMAL LOW (ref 13.0–18.0)
Lymphocytes Relative: 27 %
Lymphs Abs: 0.6 10*3/uL — ABNORMAL LOW (ref 1.0–3.6)
MCH: 36 pg — ABNORMAL HIGH (ref 26.0–34.0)
MCHC: 35 g/dL (ref 32.0–36.0)
MCV: 103 fL — ABNORMAL HIGH (ref 80.0–100.0)
Monocytes Absolute: 0.1 10*3/uL — ABNORMAL LOW (ref 0.2–1.0)
Monocytes Relative: 5 %
Neutro Abs: 1.3 10*3/uL — ABNORMAL LOW (ref 1.4–6.5)
Neutrophils Relative %: 60 %
Platelets: 67 10*3/uL — ABNORMAL LOW (ref 150–440)
RBC: 2.78 MIL/uL — ABNORMAL LOW (ref 4.40–5.90)
RDW: 18.6 % — ABNORMAL HIGH (ref 11.5–14.5)
WBC: 2.2 10*3/uL — ABNORMAL LOW (ref 3.8–10.6)

## 2017-05-06 MED ORDER — TBO-FILGRASTIM 480 MCG/0.8ML ~~LOC~~ SOSY
480.0000 ug | PREFILLED_SYRINGE | Freq: Once | SUBCUTANEOUS | Status: AC
  Administered 2017-05-06: 480 ug via SUBCUTANEOUS
  Filled 2017-05-06: qty 0.8

## 2017-05-06 MED ORDER — FILGRASTIM 480 MCG/0.8ML IJ SOSY: 480.0000 ug | PREFILLED_SYRINGE | Freq: Once | INTRAMUSCULAR | Status: DC

## 2017-05-06 NOTE — Telephone Encounter (Signed)
Called patient to inform him to not restart his Revlimid for another week.  Patient has stopped by and asked if he should keep his appointment here on Tuesday if he was going to [facility] on Monday.  Per Dr. [name], patient should go to [facility] on Monday and she will talk to Dr Evelene Croon and determine if he needs to be seen here on Tuesday.

## 2017-05-07 ENCOUNTER — Other Ambulatory Visit: Payer: Self-pay | Admitting: Family Medicine

## 2017-05-12 ENCOUNTER — Ambulatory Visit
Admission: RE | Admit: 2017-05-12 | Discharge: 2017-05-12 | Disposition: A | Discharge status: Home / Self Care | Payer: MEDICARE

## 2017-05-12 DIAGNOSIS — C9000 Multiple myeloma not having achieved remission: Secondary | ICD-10-CM

## 2017-05-12 DIAGNOSIS — D469 Myelodysplastic syndrome, unspecified: Principal | ICD-10-CM

## 2017-05-12 DIAGNOSIS — C9141 Hairy cell leukemia, in remission: Secondary | ICD-10-CM

## 2017-05-12 DIAGNOSIS — I129 Hypertensive chronic kidney disease with stage 1 through stage 4 chronic kidney disease, or unspecified chronic kidney disease: Secondary | ICD-10-CM | POA: Diagnosis not present

## 2017-05-12 DIAGNOSIS — N189 Chronic kidney disease, unspecified: Secondary | ICD-10-CM | POA: Diagnosis not present

## 2017-05-12 DIAGNOSIS — D619 Aplastic anemia, unspecified: Secondary | ICD-10-CM | POA: Diagnosis not present

## 2017-05-12 DIAGNOSIS — E1122 Type 2 diabetes mellitus with diabetic chronic kidney disease: Secondary | ICD-10-CM | POA: Diagnosis not present

## 2017-05-12 DIAGNOSIS — E7800 Pure hypercholesterolemia, unspecified: Secondary | ICD-10-CM | POA: Diagnosis not present

## 2017-05-12 DIAGNOSIS — Z794 Long term (current) use of insulin: Secondary | ICD-10-CM | POA: Diagnosis not present

## 2017-05-12 DIAGNOSIS — D696 Thrombocytopenia, unspecified: Secondary | ICD-10-CM | POA: Diagnosis not present

## 2017-05-12 DIAGNOSIS — Z882 Allergy status to sulfonamides status: Secondary | ICD-10-CM | POA: Diagnosis not present

## 2017-05-12 DIAGNOSIS — E1165 Type 2 diabetes mellitus with hyperglycemia: Secondary | ICD-10-CM | POA: Diagnosis not present

## 2017-05-12 DIAGNOSIS — D539 Nutritional anemia, unspecified: Secondary | ICD-10-CM | POA: Diagnosis not present

## 2017-05-12 DIAGNOSIS — Z87891 Personal history of nicotine dependence: Secondary | ICD-10-CM | POA: Diagnosis not present

## 2017-05-12 DIAGNOSIS — R1310 Dysphagia, unspecified: Secondary | ICD-10-CM | POA: Diagnosis not present

## 2017-05-12 DIAGNOSIS — Z7982 Long term (current) use of aspirin: Secondary | ICD-10-CM | POA: Diagnosis not present

## 2017-05-12 DIAGNOSIS — Z79899 Other long term (current) drug therapy: Secondary | ICD-10-CM | POA: Diagnosis not present

## 2017-05-12 DIAGNOSIS — R0602 Shortness of breath: Secondary | ICD-10-CM | POA: Diagnosis not present

## 2017-05-12 DIAGNOSIS — Z85828 Personal history of other malignant neoplasm of skin: Secondary | ICD-10-CM | POA: Diagnosis not present

## 2017-05-12 DIAGNOSIS — M25552 Pain in left hip: Secondary | ICD-10-CM | POA: Diagnosis not present

## 2017-05-13 ENCOUNTER — Inpatient Hospital Stay: Payer: Medicare Other

## 2017-05-13 DIAGNOSIS — E11319 Type 2 diabetes mellitus with unspecified diabetic retinopathy without macular edema: Secondary | ICD-10-CM | POA: Diagnosis not present

## 2017-05-13 DIAGNOSIS — C9141 Hairy cell leukemia, in remission: Secondary | ICD-10-CM | POA: Diagnosis not present

## 2017-05-13 DIAGNOSIS — Z7689 Persons encountering health services in other specified circumstances: Secondary | ICD-10-CM | POA: Diagnosis not present

## 2017-05-13 DIAGNOSIS — Z86010 Personal history of colonic polyps: Secondary | ICD-10-CM | POA: Diagnosis not present

## 2017-05-13 DIAGNOSIS — Z8619 Personal history of other infectious and parasitic diseases: Secondary | ICD-10-CM | POA: Diagnosis not present

## 2017-05-13 DIAGNOSIS — D4620 Refractory anemia with excess of blasts, unspecified: Secondary | ICD-10-CM | POA: Diagnosis not present

## 2017-05-13 DIAGNOSIS — R5383 Other fatigue: Secondary | ICD-10-CM | POA: Diagnosis not present

## 2017-05-13 DIAGNOSIS — C9000 Multiple myeloma not having achieved remission: Secondary | ICD-10-CM | POA: Diagnosis not present

## 2017-05-13 DIAGNOSIS — I10 Essential (primary) hypertension: Secondary | ICD-10-CM | POA: Diagnosis not present

## 2017-05-13 DIAGNOSIS — Z7984 Long term (current) use of oral hypoglycemic drugs: Secondary | ICD-10-CM | POA: Diagnosis not present

## 2017-05-13 DIAGNOSIS — Z87891 Personal history of nicotine dependence: Secondary | ICD-10-CM | POA: Diagnosis not present

## 2017-05-13 DIAGNOSIS — Z85828 Personal history of other malignant neoplasm of skin: Secondary | ICD-10-CM | POA: Diagnosis not present

## 2017-05-13 DIAGNOSIS — E538 Deficiency of other specified B group vitamins: Secondary | ICD-10-CM | POA: Diagnosis not present

## 2017-05-13 DIAGNOSIS — D46Z Other myelodysplastic syndromes: Secondary | ICD-10-CM

## 2017-05-13 DIAGNOSIS — Z7982 Long term (current) use of aspirin: Secondary | ICD-10-CM | POA: Diagnosis not present

## 2017-05-13 DIAGNOSIS — K219 Gastro-esophageal reflux disease without esophagitis: Secondary | ICD-10-CM | POA: Diagnosis not present

## 2017-05-13 DIAGNOSIS — D649 Anemia, unspecified: Secondary | ICD-10-CM | POA: Diagnosis not present

## 2017-05-13 DIAGNOSIS — N400 Enlarged prostate without lower urinary tract symptoms: Secondary | ICD-10-CM | POA: Diagnosis not present

## 2017-05-13 DIAGNOSIS — D72819 Decreased white blood cell count, unspecified: Secondary | ICD-10-CM | POA: Diagnosis not present

## 2017-05-13 DIAGNOSIS — Z79899 Other long term (current) drug therapy: Secondary | ICD-10-CM | POA: Diagnosis not present

## 2017-05-13 DIAGNOSIS — E785 Hyperlipidemia, unspecified: Secondary | ICD-10-CM | POA: Diagnosis not present

## 2017-05-13 MED ORDER — FILGRASTIM 480 MCG/0.8ML IJ SOSY: 480.0000 ug | PREFILLED_SYRINGE | Freq: Once | INTRAMUSCULAR | Status: DC

## 2017-05-13 MED ORDER — TBO-FILGRASTIM 480 MCG/0.8ML ~~LOC~~ SOSY
480.0000 ug | PREFILLED_SYRINGE | Freq: Once | SUBCUTANEOUS | Status: AC
  Administered 2017-05-13: 480 ug via SUBCUTANEOUS
  Filled 2017-05-13: qty 0.8

## 2017-05-13 NOTE — Progress Notes (Signed)
Patient had labs drawn at [facility] yesterday (05/12/17) with ANC of 0.6 WBC 1.4 (results are in patient's chart in the Care Everywhere section).  Per Rodena Piety RN, Dr. [name] approves patient to get the injection based on the [facility] lab results.

## 2017-05-19 ENCOUNTER — Other Ambulatory Visit: Payer: Self-pay | Admitting: Family Medicine

## 2017-05-20 ENCOUNTER — Inpatient Hospital Stay: Payer: Medicare Other | Attending: Hematology and Oncology

## 2017-05-20 ENCOUNTER — Other Ambulatory Visit: Payer: Self-pay

## 2017-05-20 ENCOUNTER — Inpatient Hospital Stay: Payer: Medicare Other

## 2017-05-20 DIAGNOSIS — Z8719 Personal history of other diseases of the digestive system: Secondary | ICD-10-CM | POA: Insufficient documentation

## 2017-05-20 DIAGNOSIS — C9141 Hairy cell leukemia, in remission: Secondary | ICD-10-CM

## 2017-05-20 DIAGNOSIS — Z8701 Personal history of pneumonia (recurrent): Secondary | ICD-10-CM | POA: Insufficient documentation

## 2017-05-20 DIAGNOSIS — N400 Enlarged prostate without lower urinary tract symptoms: Secondary | ICD-10-CM | POA: Diagnosis not present

## 2017-05-20 DIAGNOSIS — D4620 Refractory anemia with excess of blasts, unspecified: Secondary | ICD-10-CM | POA: Diagnosis not present

## 2017-05-20 DIAGNOSIS — Z85828 Personal history of other malignant neoplasm of skin: Secondary | ICD-10-CM | POA: Insufficient documentation

## 2017-05-20 DIAGNOSIS — Z7982 Long term (current) use of aspirin: Secondary | ICD-10-CM | POA: Diagnosis not present

## 2017-05-20 DIAGNOSIS — I10 Essential (primary) hypertension: Secondary | ICD-10-CM | POA: Insufficient documentation

## 2017-05-20 DIAGNOSIS — K219 Gastro-esophageal reflux disease without esophagitis: Secondary | ICD-10-CM | POA: Diagnosis not present

## 2017-05-20 DIAGNOSIS — Z7689 Persons encountering health services in other specified circumstances: Secondary | ICD-10-CM | POA: Insufficient documentation

## 2017-05-20 DIAGNOSIS — D649 Anemia, unspecified: Secondary | ICD-10-CM | POA: Diagnosis not present

## 2017-05-20 DIAGNOSIS — R531 Weakness: Secondary | ICD-10-CM | POA: Diagnosis not present

## 2017-05-20 DIAGNOSIS — Z86010 Personal history of colonic polyps: Secondary | ICD-10-CM | POA: Diagnosis not present

## 2017-05-20 DIAGNOSIS — C9000 Multiple myeloma not having achieved remission: Secondary | ICD-10-CM | POA: Diagnosis not present

## 2017-05-20 DIAGNOSIS — Z8619 Personal history of other infectious and parasitic diseases: Secondary | ICD-10-CM | POA: Insufficient documentation

## 2017-05-20 DIAGNOSIS — Z79899 Other long term (current) drug therapy: Secondary | ICD-10-CM | POA: Insufficient documentation

## 2017-05-20 DIAGNOSIS — D46Z Other myelodysplastic syndromes: Secondary | ICD-10-CM

## 2017-05-20 DIAGNOSIS — E119 Type 2 diabetes mellitus without complications: Secondary | ICD-10-CM | POA: Diagnosis not present

## 2017-05-20 DIAGNOSIS — Z87891 Personal history of nicotine dependence: Secondary | ICD-10-CM | POA: Diagnosis not present

## 2017-05-20 DIAGNOSIS — E538 Deficiency of other specified B group vitamins: Secondary | ICD-10-CM | POA: Diagnosis not present

## 2017-05-20 LAB — CBC WITH DIFFERENTIAL/PLATELET
Basophils Absolute: 0 10*3/uL (ref 0–0.1)
Basophils Relative: 2 %
Eosinophils Absolute: 0.1 10*3/uL (ref 0–0.7)
Eosinophils Relative: 8 %
HCT: 27.2 % — ABNORMAL LOW (ref 40.0–52.0)
Hemoglobin: 9.6 g/dL — ABNORMAL LOW (ref 13.0–18.0)
Lymphocytes Relative: 30 %
Lymphs Abs: 0.6 10*3/uL — ABNORMAL LOW (ref 1.0–3.6)
MCH: 36.5 pg — ABNORMAL HIGH (ref 26.0–34.0)
MCHC: 35.3 g/dL (ref 32.0–36.0)
MCV: 103.2 fL — ABNORMAL HIGH (ref 80.0–100.0)
Monocytes Absolute: 0.2 10*3/uL (ref 0.2–1.0)
Monocytes Relative: 10 %
Neutro Abs: 0.9 10*3/uL — ABNORMAL LOW (ref 1.4–6.5)
Neutrophils Relative %: 50 %
Platelets: 63 10*3/uL — ABNORMAL LOW (ref 150–440)
RBC: 2.64 MIL/uL — ABNORMAL LOW (ref 4.40–5.90)
RDW: 17.8 % — ABNORMAL HIGH (ref 11.5–14.5)
WBC: 1.9 10*3/uL — ABNORMAL LOW (ref 3.8–10.6)

## 2017-05-20 MED ORDER — FILGRASTIM 480 MCG/0.8ML IJ SOSY: 480.0000 ug | PREFILLED_SYRINGE | Freq: Once | INTRAMUSCULAR | Status: DC

## 2017-05-20 MED ORDER — TBO-FILGRASTIM 480 MCG/0.8ML ~~LOC~~ SOSY
480.0000 ug | PREFILLED_SYRINGE | Freq: Once | SUBCUTANEOUS | Status: AC
  Administered 2017-05-20: 480 ug via SUBCUTANEOUS
  Filled 2017-05-20: qty 0.8

## 2017-05-27 ENCOUNTER — Inpatient Hospital Stay (HOSPITAL_BASED_OUTPATIENT_CLINIC_OR_DEPARTMENT_OTHER): Payer: Medicare Other | Admitting: Hematology and Oncology

## 2017-05-27 ENCOUNTER — Inpatient Hospital Stay: Payer: Medicare Other

## 2017-05-27 ENCOUNTER — Encounter: Payer: Self-pay | Admitting: Hematology and Oncology

## 2017-05-27 ENCOUNTER — Other Ambulatory Visit: Payer: Self-pay | Admitting: *Deleted

## 2017-05-27 VITALS — BP 166/72 | HR 78 | Temp 97.9°F | Resp 18 | Wt 220.7 lb

## 2017-05-27 DIAGNOSIS — D649 Anemia, unspecified: Secondary | ICD-10-CM

## 2017-05-27 DIAGNOSIS — Z7982 Long term (current) use of aspirin: Secondary | ICD-10-CM

## 2017-05-27 DIAGNOSIS — C9000 Multiple myeloma not having achieved remission: Secondary | ICD-10-CM | POA: Diagnosis not present

## 2017-05-27 DIAGNOSIS — K219 Gastro-esophageal reflux disease without esophagitis: Secondary | ICD-10-CM

## 2017-05-27 DIAGNOSIS — Z79899 Other long term (current) drug therapy: Secondary | ICD-10-CM

## 2017-05-27 DIAGNOSIS — Z7689 Persons encountering health services in other specified circumstances: Secondary | ICD-10-CM | POA: Diagnosis not present

## 2017-05-27 DIAGNOSIS — Z87891 Personal history of nicotine dependence: Secondary | ICD-10-CM

## 2017-05-27 DIAGNOSIS — I10 Essential (primary) hypertension: Secondary | ICD-10-CM | POA: Diagnosis not present

## 2017-05-27 DIAGNOSIS — R531 Weakness: Secondary | ICD-10-CM

## 2017-05-27 DIAGNOSIS — Z86010 Personal history of colonic polyps: Secondary | ICD-10-CM | POA: Diagnosis not present

## 2017-05-27 DIAGNOSIS — Z8619 Personal history of other infectious and parasitic diseases: Secondary | ICD-10-CM

## 2017-05-27 DIAGNOSIS — E119 Type 2 diabetes mellitus without complications: Secondary | ICD-10-CM

## 2017-05-27 DIAGNOSIS — C9141 Hairy cell leukemia, in remission: Secondary | ICD-10-CM | POA: Diagnosis not present

## 2017-05-27 DIAGNOSIS — D4620 Refractory anemia with excess of blasts, unspecified: Secondary | ICD-10-CM | POA: Diagnosis not present

## 2017-05-27 DIAGNOSIS — Z8719 Personal history of other diseases of the digestive system: Secondary | ICD-10-CM | POA: Diagnosis not present

## 2017-05-27 DIAGNOSIS — N400 Enlarged prostate without lower urinary tract symptoms: Secondary | ICD-10-CM | POA: Diagnosis not present

## 2017-05-27 DIAGNOSIS — Z7189 Other specified counseling: Secondary | ICD-10-CM | POA: Insufficient documentation

## 2017-05-27 DIAGNOSIS — E538 Deficiency of other specified B group vitamins: Secondary | ICD-10-CM

## 2017-05-27 DIAGNOSIS — Z85828 Personal history of other malignant neoplasm of skin: Secondary | ICD-10-CM

## 2017-05-27 DIAGNOSIS — D46Z Other myelodysplastic syndromes: Secondary | ICD-10-CM

## 2017-05-27 DIAGNOSIS — D708 Other neutropenia: Secondary | ICD-10-CM

## 2017-05-27 DIAGNOSIS — Z8701 Personal history of pneumonia (recurrent): Secondary | ICD-10-CM

## 2017-05-27 LAB — CBC WITH DIFFERENTIAL/PLATELET
Basophils Absolute: 0 10*3/uL (ref 0–0.1)
Basophils Relative: 1 %
Eosinophils Absolute: 0.1 10*3/uL (ref 0–0.7)
Eosinophils Relative: 6 %
HCT: 26.3 % — ABNORMAL LOW (ref 40.0–52.0)
Hemoglobin: 9 g/dL — ABNORMAL LOW (ref 13.0–18.0)
Lymphocytes Relative: 28 %
Lymphs Abs: 0.4 10*3/uL — ABNORMAL LOW (ref 1.0–3.6)
MCH: 36.1 pg — ABNORMAL HIGH (ref 26.0–34.0)
MCHC: 34.2 g/dL (ref 32.0–36.0)
MCV: 105.6 fL — ABNORMAL HIGH (ref 80.0–100.0)
Monocytes Absolute: 0.2 10*3/uL (ref 0.2–1.0)
Monocytes Relative: 11 %
Neutro Abs: 0.8 10*3/uL — ABNORMAL LOW (ref 1.4–6.5)
Neutrophils Relative %: 54 %
Platelets: 61 10*3/uL — ABNORMAL LOW (ref 150–440)
RBC: 2.49 MIL/uL — ABNORMAL LOW (ref 4.40–5.90)
RDW: 18.3 % — ABNORMAL HIGH (ref 11.5–14.5)
WBC: 1.5 10*3/uL — ABNORMAL LOW (ref 3.8–10.6)

## 2017-05-27 LAB — COMPREHENSIVE METABOLIC PANEL
ALT: 16 U/L — ABNORMAL LOW (ref 17–63)
AST: 24 U/L (ref 15–41)
Albumin: 3.8 g/dL (ref 3.5–5.0)
Alkaline Phosphatase: 65 U/L (ref 38–126)
Anion gap: 9 (ref 5–15)
BUN: 31 mg/dL — ABNORMAL HIGH (ref 6–20)
CO2: 24 mmol/L (ref 22–32)
Calcium: 9.2 mg/dL (ref 8.9–10.3)
Chloride: 103 mmol/L (ref 101–111)
Creatinine, Ser: 1.4 mg/dL — ABNORMAL HIGH (ref 0.61–1.24)
GFR calc Af Amer: 53 mL/min — ABNORMAL LOW (ref >60)
GFR calc non Af Amer: 46 mL/min — ABNORMAL LOW (ref >60)
Glucose, Bld: 287 mg/dL — ABNORMAL HIGH (ref 65–99)
Potassium: 4.2 mmol/L (ref 3.5–5.1)
Sodium: 136 mmol/L (ref 135–145)
Total Bilirubin: 0.7 mg/dL (ref 0.3–1.2)
Total Protein: 6.7 g/dL (ref 6.5–8.1)

## 2017-05-27 LAB — VITAMIN B12: Vitamin B-12: 693 pg/mL (ref 180–914)

## 2017-05-27 MED ORDER — FILGRASTIM 480 MCG/0.8ML IJ SOSY: 480.0000 ug | PREFILLED_SYRINGE | Freq: Once | INTRAMUSCULAR | Status: DC

## 2017-05-27 MED ORDER — TBO-FILGRASTIM 480 MCG/0.8ML ~~LOC~~ SOSY
480.0000 ug | PREFILLED_SYRINGE | Freq: Once | SUBCUTANEOUS | Status: AC
  Administered 2017-05-27: 480 ug via SUBCUTANEOUS
  Filled 2017-05-27: qty 0.8

## 2017-05-27 NOTE — Progress Notes (Signed)
Pt stated living alone now -wife into care facility 3 mo ago. Stated Revlimid makes him feel weak and constipated .

## 2017-05-27 NOTE — Progress Notes (Signed)
DISCONTINUE ON PATHWAY REGIMEN - MDS     A cycle is every 28 days:     Lenalidomide        Dose Mod: None     Darbepoetin        Dose Mod: None  **Always confirm dose/schedule in your pharmacy ordering system**    REASON: Other Reason PRIOR TREATMENT: [id number]: Lenalidomide 10 mg D1-21 q28 Days + Darbepoetin 200 mcg q2 Weeks TREATMENT RESPONSE: Unable to Evaluate  START ON PATHWAY REGIMEN - MDS     A cycle is every 28 days:     Azacitidine   **Always confirm dose/schedule in your pharmacy ordering system**    Patient Characteristics: Lower-Risk (IPSS-R Score ? 3.5), Second Line, Has Not Yet Had Azacitidine WHO Disease Classification: MDS-U Bone Marrow Blasts (percent): ? 2% Cytogenetic Category: Risk Score Calculated < IPSS-R Platelets (x 10^9/L): 50 to < 100 Absolute Neutrophil Count (x 10^9/L): < 0.8 Line of therapy: Second Line IPSS-R Risk Category: Unknown IPSS-R Risk Score: Unknown Check here if patient's risk score was calculated prior to the International Prognostic Scoring System-Revised (IPSS-R): false Hemoglobin (g/dl): 8 to < 10 Prior Azacitidine<= Has Not Yet Had Azacitidine Intent of Therapy: Non-Curative / Palliative Intent, Discussed with Patient

## 2017-05-27 NOTE — Progress Notes (Signed)
[facility] day:  05/27/2017    Chief Complaint: Alex Smith is a 82 y.o. male with a myelodysplastic marrow, smoldering myeloma, and a history of hairy cell leukemia who is seen for 1 month assessment.  HPI:  The patient was last seen in the medical oncology clinic on 04/29/2017.  At that time, he felt fatiigued.  Exam was stable.  Counts included a hematocrit 23.9, hemoglobin of 8.4, platelets 64,000, and WBC 2000 with ANC 1200.  He received GCSF.  He received 1 unit of PRBCs on 04/30/2017.  Revlimid was on temporary hold.  He saw Dr. [name] on 05/12/2017.  CBC included a hematocrit of 31.6, hemoglobin 10.3, MCV 106, platelets 70,000, WBC 1400 with an ANC of 600.  Retic was 2.2%.  M-spike was 0.7 gm/dL.  Kappa free light chains were 5.31, lambda free light chains 1.27 with a ratio of 4.18.  Creatinine was 1.28.    He has received GCSF weekly (last 05/20/2017).  Symptomatically, patient is doing well. He notes that he gets "weak" when he does a lot. Patient attributes symptoms to potentially being his Revlimid; last dose was 4 weeks ago. Patient denies any episodes of chest pain or shortness of breath. He does not experience orthopnea when supine. Patient denies any B symptoms. He has had no interval infections. Patient eating well.  He has gained 2 pounds since his last visit.    Past Medical History:  Diagnosis Date  . Anemia   . B12 deficiency   . Basal cell carcinoma of face   . BPH (benign prostatic hypertrophy)    followed by urology, discharged (Dr. Bernardo Heater)  . CAP (community acquired pneumonia) 02/15/2015  . Colon polyps   . Diverticulosis   . GERD (gastroesophageal reflux disease)   . Hairy cell leukemia ([place]) 2006   recurrent, seizure on rituxan, now on cladribine ([place])  . History of pneumonia 2000's   "once" (07/07/2012)  . History of shingles   . HLD (hyperlipidemia)   . Hypertension   . Pneumonia   . Shortness  of breath dyspnea   . Systolic murmur 06/04/5101  . Type 2 diabetes, controlled, with retinopathy ([place])     Past Surgical History:  Procedure Laterality Date  . [place] VITRECTOMY WITH 20 GAUGE MVR PORT FOR MACULAR HOLE  07/07/2012   Procedure: 25 GAUGE PARS PLANA VITRECTOMY WITH 20 GAUGE MVR PORT FOR MACULAR HOLE;  Surgeon: Hayden Pedro, MD;  Location: [facility];  Service: Ophthalmology;  Laterality: Left;  . BONE MARROW BIOPSY  2016  . CARDIOVASCULAR STRESS TEST  2013   treadmill - no evidence ischemia, EF 61%  . CATARACT EXTRACTION W/ INTRAOCULAR LENS  IMPLANT, BILATERAL  ~ 2010  . COLONOSCOPY  2014   [place] WNL no rpt needed, h/o polyps  . EYE SURGERY Left 06/2012   laser surgery  . GAS INSERTION  07/07/2012   Procedure: INSERTION OF GAS;  Surgeon: Hayden Pedro, MD;  Location: [facility];  Service: Ophthalmology;  Laterality: Left;  [id number]  . PERIPHERAL VASCULAR CATHETERIZATION N/A 02/23/2015   Procedure: Glori Luis Cath Insertion;  Surgeon: Algernon Huxley, MD;  Location: [facility];  Service: Cardiovascular;  Laterality: N/A;  . SERUM PATCH  07/07/2012   Procedure: SERUM PATCH;  Surgeon: Hayden Pedro, MD;  Location: [facility];  Service: Ophthalmology;  Laterality: Left;  . SKIN CANCER EXCISION     "all over my  face" (07/07/2012)    Family History  Problem Relation Age of Onset  . Dementia Mother   . Heart failure Father 41  . Cancer Sister        breast  . Diabetes Paternal Uncle   . Diabetes Paternal Aunt   . CAD Brother 13       MI  . Stroke Neg Hx     Social History:  reports that he quit smoking about 48 years ago. His smoking use included Cigarettes. He has a 50.00 pack-year smoking history. He has never used smokeless tobacco. He reports that he drinks alcohol. He reports that he does not use drugs.  He works at [employer] 20-32 hours/week.  The patient 's wife has had 3 hip operations.  She underwent hip replacement 03/06/2016.  His wife is now in a nursing  home.  He states that they either eat out or bring food in.  His best contact number is his cell: [phone].  He is alone today.  Allergies:  Allergies  Allergen Reactions  . Rituximab Rash    Chest tightness  . Blood-Group Specific Substance Other (See Comments)    Had a post transfusion reaction of red blood cells; NOW REQUIRES WASHED BLOOD CELLS  . Primaxin [Imipenem] Other (See Comments)    Possible allergy  . Sulfa Antibiotics Itching and Rash  Possible allergy to Zoloft, allopurinol, and a chemotherapy medication.  He tolerates Dapsone.  Current Medications: Current Outpatient Prescriptions  Medication Sig Dispense Refill  . ACCU-CHEK AVIVA PLUS test strip USE TO CHECK SUGAR TWICE DAILY 200 each 0  . acyclovir (ZOVIRAX) 400 MG tablet TAKE TWO TABLETS TWICE DAILY 120 tablet 6  . aspirin EC 81 MG tablet Take 81 mg by mouth daily.    Marland Kitchen HUMULIN R 100 UNIT/ML injection INJECT 10 UNITS SUBCUTANEOUSLY TWICE DAILY BEFORE MEALS 10 mL 6  . hydrochlorothiazide (HYDRODIURIL) 25 MG tablet Take 1 tablet (25 mg total) by mouth daily. 30 tablet 6  . insulin NPH Human (HUMULIN N) 100 UNIT/ML injection Inject 0.39 mLs (39 Units total) into the skin 2 (two) times daily before a meal. 20 mL 6  . lisinopril (PRINIVIL,ZESTRIL) 20 MG tablet TAKE 1 TABLET BY MOUTH TWICE DAILY 60 tablet 6  . metFORMIN (GLUCOPHAGE) 1000 MG tablet TAKE ONE TABLET BY MOUTH TWICE DAILY AS DIRECTED 180 tablet 1  . MICROLET LANCETS MISC USE AS DIRECTED 200 each 3  . pravastatin (PRAVACHOL) 20 MG tablet TAKE ONE TABLET AT BEDTIME 30 tablet 6  . ranitidine (ZANTAC) 150 MG tablet TAKE 1 TABLET BY MOUTH TWICE DAILY AS NEEDED 180 tablet 3  . tamsulosin (FLOMAX) 0.4 MG CAPS capsule TAKE 1 CAPSULE BY MOUTH EVERY DAY 30 capsule 1  . vitamin E 400 UNIT capsule Take 200 Units by mouth daily.     Marland Kitchen lenalidomide (REVLIMID) 5 MG capsule 5 mg a day for 21 days then 7 days off (Patient not taking: Reported on 05/27/2017) 21 capsule 0  .  lidocaine-prilocaine (EMLA) cream Apply 1 application topically as needed. (Patient not taking: Reported on 05/27/2017) 30 g 3  . temazepam (RESTORIL) 7.5 MG capsule Take 7.5 mg by mouth once.     . traZODone (DESYREL) 50 MG tablet TAKE 1/2 TO 1 TABLET BY MOUTH AT [facility] NEEDED FOR SLEEP (Patient not taking: Reported on 05/27/2017) 30 tablet 0   Current Facility-Administered Medications  Medication Dose Route Frequency Provider Last Rate Last Dose  . Tbo-Filgrastim (GRANIX) injection 480 mcg  480 mcg Subcutaneous Once Lequita Asal, MD       Facility-Administered Medications Ordered in Other Visits  Medication Dose Route Frequency Provider Last Rate Last Dose  . heparin lock flush 100 unit/mL  500 Units Intravenous Once [place], Melissa C, MD      . sodium chloride 0.9 % injection 10 mL  10 mL Intravenous PRN Lequita Asal, MD   10 mL at 03/03/15 0903  . sodium chloride 0.9 % injection 10 mL  10 mL Intracatheter PRN Lequita Asal, MD   10 mL at 03/10/15 1410   Review of Systems:  GENERAL:  Feels "ok". "Weak when he does a lot".  No fevers or sweats.  Weight up 2 pound. PERFORMANCE STATUS (ECOG):  1 HEENT: No visual changes, runny nose, sore throat. Pulmonary:  Chronic shortness of breath on exertion.  No cough.  No hemoptysis. Cardiac:  No chest pain, palpitations, orthopnea, or PND.  Heart murmur. GI:  Appetite good.  No nausea, vomiting, diarrhea, constipation, melena or hematochezia.  GU:  No urgency, frequency, dysuria, or hematuria.  On Flomax. Musculoskeletal:  No back pain.  No joint pain.  No muscle tenderness. Extremities:  No pain or swelling. Skin:  No rashes or skin changes. Neuro:  No headache, numbness or weakness, balance or coordination issues. Endocrine:  Diabetes.  No thyroid issues, hot flashes or night sweats. Psych:  No mood changes, depression or anxiety.  Pain:  No focal pain. Review of systems:  All other systems reviewed and found to be  negative.   Physical Exam: Blood pressure (!) 166/72, pulse 78, temperature 97.9 F (36.6 C), temperature source Tympanic, resp. rate 18, weight 220 lb 11.2 oz (100.1 kg).  GENERAL:  Well developed, well nourished, gentleman sitting comfortably in the exam room in no acute distress. MENTAL STATUS:  Alert and oriented to person, place and time. HEAD:  Pearline Cables white hair.  Normocephalic, atraumatic, face symmetric, no Cushingoid features. EYES:  Glasses. Blue eyes.  Pupils equal round and reactive to light and accomodation.  No conjunctivitis or scleral icterus. ENT:  Oropharynx clear without lesion.  Tongue normal. Mucous membranes moist.  RESPIRATORY:  Clear to auscultation without rales, wheezes or rhonchi. CARDIOVASCULAR:  Grade III systolic murmur. Regular rate and rhythm without, rub or gallop. ABDOMEN:  Soft, non-tender, with active bowel sounds, and no hepatosplenomegaly.  No masses. SKIN:  No rashes or ulcers. EXTREMITIES: Bilateral pitting lower extremity edema (L>R).  No skin discoloration or tenderness.  No palpable cords. LYMPH NODES: No palpable cervical, supraclavicular, axillary or inguinal adenopathy  NEUROLOGICAL: Unremarkable. PSYCH:  Appropriate.    Appointment on 05/27/2017  Component Date Value Ref Range Status  . WBC 05/27/2017 1.5* 3.8 - 10.6 K/uL Final  . RBC 05/27/2017 2.49* 4.40 - 5.90 MIL/uL Final  . Hemoglobin 05/27/2017 9.0* 13.0 - 18.0 g/dL Final  . HCT 05/27/2017 26.3* 40.0 - 52.0 % Final  . MCV 05/27/2017 105.6* 80.0 - 100.0 fL Final  . MCH 05/27/2017 36.1* 26.0 - 34.0 pg Final  . MCHC 05/27/2017 34.2  32.0 - 36.0 g/dL Final  . RDW 05/27/2017 18.3* 11.5 - 14.5 % Final  . Platelets 05/27/2017 61* 150 - 440 K/uL Final  . Neutrophils Relative % 05/27/2017 54  % Final  . Neutro Abs 05/27/2017 0.8* 1.4 - 6.5 K/uL Final  . Lymphocytes Relative 05/27/2017 28  % Final  . Lymphs Abs 05/27/2017 0.4* 1.0 - 3.6 K/uL Final  . Monocytes Relative 05/27/2017 11  %  Final   . Monocytes Absolute 05/27/2017 0.2  0.2 - 1.0 K/uL Final  . Eosinophils Relative 05/27/2017 6  % Final  . Eosinophils Absolute 05/27/2017 0.1  0 - 0.7 K/uL Final  . Basophils Relative 05/27/2017 1  % Final  . Basophils Absolute 05/27/2017 0.0  0 - 0.1 K/uL Final  . Sodium 05/27/2017 136  135 - 145 mmol/L Final  . Potassium 05/27/2017 4.2  3.5 - 5.1 mmol/L Final  . Chloride 05/27/2017 103  101 - 111 mmol/L Final  . CO2 05/27/2017 24  22 - 32 mmol/L Final  . Glucose, Bld 05/27/2017 287* 65 - 99 mg/dL Final  . BUN 05/27/2017 31* 6 - 20 mg/dL Final  . Creatinine, Ser 05/27/2017 1.40* 0.61 - 1.24 mg/dL Final  . Calcium 05/27/2017 9.2  8.9 - 10.3 mg/dL Final  . Total Protein 05/27/2017 6.7  6.5 - 8.1 g/dL Final  . Albumin 05/27/2017 3.8  3.5 - 5.0 g/dL Final  . AST 05/27/2017 24  15 - 41 U/L Final  . ALT 05/27/2017 16* 17 - 63 U/L Final  . Alkaline Phosphatase 05/27/2017 65  38 - 126 U/L Final  . Total Bilirubin 05/27/2017 0.7  0.3 - 1.2 mg/dL Final  . GFR calc non Af Amer 05/27/2017 46* >60 mL/min Final  . GFR calc Af Amer 05/27/2017 53* >60 mL/min Final   Comment: (NOTE) The eGFR has been calculated using the CKD EPI equation. This calculation has not been validated in all clinical situations. eGFR's persistently <60 mL/min signify possible Chronic Kidney Disease.   . Anion gap 05/27/2017 9  5 - 15 Final    Assessment:  Alex Smith is a 82 y.o. 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  He was diagnosed with hairy cell leukemia in 2006.  He received cladribine 11/06-11/05/2005.  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   Bone marrow aspirate and biopsy on 02/07/2015 revealed a extensive marrow involvement by recurrent hairy cell leukemia (approximately 80% of cells in the core). There was a monoclonal plasma cell infiltrate (approximately 10%), compatible with a plasma cell neoplasm. Marrow was hypercellular (40-50%) with markedly diminished  residual trilineage hematopoiesis and mild multilineage dyspoiesis.  Peripheral smear revealed leukopenia (WBC 1900) with 2% atypical lymphoid cells compatible with hairy cell leukemia. There was moderate anemia with anisopoikilocytosis. 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  FISH studies for MDS were negative.  Cytogenetics were normal (46, XY).  He has an unclear allergy history.  He may be allergic to Primaxin, Zoloft, allopurinol, and a chemotherapy medication.  Reaction occurred in 06/2005.  He is allergic to sulfa.  He is not allergic to dapsone.  He requires washed RBCs.  He is on prophylactic acyclovir.  Dapsone and voriconazole were discontinued.  He has a history of shingles prior to prophylactic acyclovir.  He is 17 months status post cladribine (began 03/03/2015).  His counts plateaued on 07/25/2015 and have subsequently decreased.  Labs on 07/25/2015 revealed a normal B12 (894), folate (60.5), and TSH (2.562).  Ferritin was 50 on 04/26/2016.  Labs on 08/22/2016 revealed a normal B12, folate, and TSH.  He is on oral B12.  Bone marrow on 11/03/2015 revealed persistent plasma cell neoplasm, now with mildy atypical monoclonal plasma cells estimated at 20-30%.   There was no morphologic evidence of residual hairy cell leukemia.  There was a minute population (0.03%) with hairy cell immunophenotype detected  by flow.  Marrow was normocellular for age with relative erythroid hyperplasia, relative myeloid hypoplasia, mild dyspoiesis, adequate megakaryocytes and no increased blasts.  There was no significant increase in marrow reticulin fibers.  Iron was present.  FISH studies for MDS were negative.  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  Cytogenetics were normal (46, XY).  Bone marrow on 03/13/2016 was normocellular to marginally hypercellular for age (20-40%) with relative  erythroid hyperplasia and mild dyserythropoiesis, decreased granulopoiesis and adequate megakaryocytes.  There was a persistent 15-20% plasma cell neoplasm.  There was no overt increase in reticulin fibrosis.  Storage iron was present.  Flow cytometry revealed abnormal/monocytic plasma cell population (3% sample), consistent with a plasma cell neoplastic process.  Cytogenetics were normal (46, XY).  FISH studies for myeloma revealed a CCND1/IGH translocation, t(11;14) in 53% of nuclei.  SPEP has been followed: 1.0 on 02/03/2015, 1.4 gm/dL on 07/25/2015, 1.3 gm/dL on 11/17/2015, 1.2 gm/dL on 06/28/2016, 1.0 gm/dL on 09/20/2016, 0.8 gm/dL on 04/29/2017, and 0.7 gm/dl [facility]) on 05/12/2017.    Kappa free light chains have been followed: 47.63 (ratio 3.12) on 02/03/2015, 54.9 (ratio 3.14) on 02/07/2015, 46.23 (ratio 3.68) on 07/25/2015, and 50.5 (ratio 4.82) on 11/17/2015.  Beta2-microglobulin was 2.5 on 11/17/2015.  24 hour urine on 11/21/2015 revealed 234.8 mg protein in 24 hours (no monoclonal protein).  Bone survey on 11/24/2015 revealed no lytic lesions.  Epo level was 329.6 on 03/29/2016.  He began a trial of Procrit.  He received 10,000 units of Procrit weekly (04/26/2016 - 06/14/2016).  Hemoglobin ranged between 8.4 and 8.9 without trend.    He is s/p 1 cycle ofRevlimid (10 mg) and Decadron(06/29/2016 - 07/19/2016). Cycle #1 was complicated by anemia and neutropenia. He has received 4 units of PRBCs(1 unit:07/22/2016 and 2 units: 08/07/2016, 1 unit on 01/14/2017, and 1 unit on 02/27/2017; 1 unit on 04/30/2017). He received 6 days ofGCSF.  He is s/p 5 cycles of Revlimid (5 mg) and Decadron (09/14/2016 - 04/01/2017).  Revlimid was on temporary hold beginning 03/04/2017.  He began weekly GCSF on 11/05/2016.  He receives GCSF if his ANC is <= 1500.  He is on aspirin 81 mg a day prophylaxis.  PET scan on 03/14/2016 revealed no evidence of hypermetabolic lymphadenopathy or focal skeletal  lesions to suggest marrow or cortical bone involvement.  Symptomatically, he feels fatiigued.  Exam reveals a grade III systolic murmur. He has bilateral lower extremity edema (L>R).  Counts include a hematocrit 26.3, hemoglobin of 9.0, platelets 61,000, and WBC 1500 with ANC 800.  Plan: 1.  Labs today:  CBC with diff, CMP, B12. 2.  Discuss anemia. Patient fatigued with no chest pain or shortness of breath.  Last PRBC transfusion was on 02/27/2017. 3.  Discuss need for repeat bone marrow testing to establish new baseline. Discuss use of Vidaza for MDS as recommended by Dr. Evelene Croon. Patient in agreement. Will schedule bone marrow biopsy for 06/03/2017. 4.  Schedule echocardiogram for 06/03/2017. 5.  Consider reintroduction of Procrit.   6.  Preauthorize Vidaza. 7.  Discontinue Revlimid. 8.  GCSF 480 mcg today. 9.  RTC weekly x 3 for labs (CBC with diff) +/- GCSF  10. RTC after bone marrow and echo for MD assessment, labs (CBC with diff, CMP, Mg) and +/- Neupogen.   Honor Loh, NP  05/27/2017, 11:23 AM   I saw and evaluated the patient, participating in the key portions of the service and reviewing pertinent diagnostic  studies and records.  I reviewed the nurse practitioner's note and agree with the findings and the plan.  The assessment and plan were discussed with the patient.  Multiple questions were asked by the patient and answered.   Lequita Asal, MD  05/27/2017, 11:23 AM

## 2017-05-28 ENCOUNTER — Other Ambulatory Visit: Payer: Self-pay | Admitting: *Deleted

## 2017-05-28 DIAGNOSIS — C9000 Multiple myeloma not having achieved remission: Secondary | ICD-10-CM

## 2017-06-02 ENCOUNTER — Inpatient Hospital Stay: Payer: Medicare Other

## 2017-06-02 DIAGNOSIS — Z8719 Personal history of other diseases of the digestive system: Secondary | ICD-10-CM | POA: Diagnosis not present

## 2017-06-02 DIAGNOSIS — N400 Enlarged prostate without lower urinary tract symptoms: Secondary | ICD-10-CM | POA: Diagnosis not present

## 2017-06-02 DIAGNOSIS — D4620 Refractory anemia with excess of blasts, unspecified: Secondary | ICD-10-CM | POA: Diagnosis not present

## 2017-06-02 DIAGNOSIS — Z7689 Persons encountering health services in other specified circumstances: Secondary | ICD-10-CM | POA: Diagnosis not present

## 2017-06-02 DIAGNOSIS — C9000 Multiple myeloma not having achieved remission: Secondary | ICD-10-CM | POA: Diagnosis not present

## 2017-06-02 DIAGNOSIS — D46Z Other myelodysplastic syndromes: Secondary | ICD-10-CM

## 2017-06-02 DIAGNOSIS — Z8619 Personal history of other infectious and parasitic diseases: Secondary | ICD-10-CM | POA: Diagnosis not present

## 2017-06-02 DIAGNOSIS — E119 Type 2 diabetes mellitus without complications: Secondary | ICD-10-CM | POA: Diagnosis not present

## 2017-06-02 DIAGNOSIS — E538 Deficiency of other specified B group vitamins: Secondary | ICD-10-CM | POA: Diagnosis not present

## 2017-06-02 DIAGNOSIS — I10 Essential (primary) hypertension: Secondary | ICD-10-CM | POA: Diagnosis not present

## 2017-06-02 DIAGNOSIS — Z87891 Personal history of nicotine dependence: Secondary | ICD-10-CM | POA: Diagnosis not present

## 2017-06-02 DIAGNOSIS — Z7982 Long term (current) use of aspirin: Secondary | ICD-10-CM | POA: Diagnosis not present

## 2017-06-02 DIAGNOSIS — Z79899 Other long term (current) drug therapy: Secondary | ICD-10-CM | POA: Diagnosis not present

## 2017-06-02 DIAGNOSIS — R531 Weakness: Secondary | ICD-10-CM | POA: Diagnosis not present

## 2017-06-02 DIAGNOSIS — K219 Gastro-esophageal reflux disease without esophagitis: Secondary | ICD-10-CM | POA: Diagnosis not present

## 2017-06-02 DIAGNOSIS — C9141 Hairy cell leukemia, in remission: Secondary | ICD-10-CM | POA: Diagnosis not present

## 2017-06-02 DIAGNOSIS — Z85828 Personal history of other malignant neoplasm of skin: Secondary | ICD-10-CM | POA: Diagnosis not present

## 2017-06-02 DIAGNOSIS — D649 Anemia, unspecified: Secondary | ICD-10-CM | POA: Diagnosis not present

## 2017-06-02 DIAGNOSIS — Z86010 Personal history of colonic polyps: Secondary | ICD-10-CM | POA: Diagnosis not present

## 2017-06-02 LAB — CBC WITH DIFFERENTIAL/PLATELET
Basophils Absolute: 0 10*3/uL (ref 0–0.1)
Basophils Relative: 1 %
Eosinophils Absolute: 0.1 10*3/uL (ref 0–0.7)
Eosinophils Relative: 5 %
HCT: 27.1 % — ABNORMAL LOW (ref 40.0–52.0)
Hemoglobin: 9.4 g/dL — ABNORMAL LOW (ref 13.0–18.0)
Lymphocytes Relative: 36 %
Lymphs Abs: 0.7 10*3/uL — ABNORMAL LOW (ref 1.0–3.6)
MCH: 36.4 pg — ABNORMAL HIGH (ref 26.0–34.0)
MCHC: 34.5 g/dL (ref 32.0–36.0)
MCV: 105.6 fL — ABNORMAL HIGH (ref 80.0–100.0)
Monocytes Absolute: 0.2 10*3/uL (ref 0.2–1.0)
Monocytes Relative: 12 %
Neutro Abs: 0.9 10*3/uL — ABNORMAL LOW (ref 1.4–6.5)
Neutrophils Relative %: 46 %
Platelets: 65 10*3/uL — ABNORMAL LOW (ref 150–440)
RBC: 2.57 MIL/uL — ABNORMAL LOW (ref 4.40–5.90)
RDW: 18.4 % — ABNORMAL HIGH (ref 11.5–14.5)
WBC: 1.9 10*3/uL — ABNORMAL LOW (ref 3.8–10.6)

## 2017-06-02 MED ORDER — TBO-FILGRASTIM 480 MCG/0.8ML ~~LOC~~ SOSY
480.0000 ug | PREFILLED_SYRINGE | Freq: Once | SUBCUTANEOUS | Status: AC
  Administered 2017-06-02: 480 ug via SUBCUTANEOUS

## 2017-06-02 MED ORDER — FILGRASTIM 480 MCG/0.8ML IJ SOSY: 480.0000 ug | PREFILLED_SYRINGE | Freq: Once | INTRAMUSCULAR | Status: DC

## 2017-06-03 ENCOUNTER — Ambulatory Visit: Admission: RE | Admit: 2017-06-03 | Payer: Medicare Other | Source: Ambulatory Visit

## 2017-06-04 ENCOUNTER — Other Ambulatory Visit: Payer: Self-pay | Admitting: Physician Assistant

## 2017-06-05 ENCOUNTER — Ambulatory Visit
Admission: RE | Admit: 2017-06-05 | Discharge: 2017-06-05 | Disposition: A | Discharge status: Home / Self Care | Payer: Medicare Other | Source: Ambulatory Visit | Attending: Urgent Care | Admitting: Urgent Care

## 2017-06-05 ENCOUNTER — Other Ambulatory Visit (HOSPITAL_COMMUNITY)
Admission: RE | Admit: 2017-06-05 | Discharge: 2017-06-05 | Disposition: A | Discharge status: Home / Self Care | Payer: Medicare Other | Source: Ambulatory Visit | Attending: Hematology and Oncology | Admitting: Hematology and Oncology

## 2017-06-05 ENCOUNTER — Ambulatory Visit: Admission: RE | Admit: 2017-06-05 | Payer: Medicare Other | Source: Ambulatory Visit

## 2017-06-05 DIAGNOSIS — K219 Gastro-esophageal reflux disease without esophagitis: Secondary | ICD-10-CM | POA: Diagnosis not present

## 2017-06-05 DIAGNOSIS — E119 Type 2 diabetes mellitus without complications: Secondary | ICD-10-CM | POA: Diagnosis not present

## 2017-06-05 DIAGNOSIS — E785 Hyperlipidemia, unspecified: Secondary | ICD-10-CM | POA: Insufficient documentation

## 2017-06-05 DIAGNOSIS — C9000 Multiple myeloma not having achieved remission: Secondary | ICD-10-CM | POA: Insufficient documentation

## 2017-06-05 DIAGNOSIS — I119 Hypertensive heart disease without heart failure: Secondary | ICD-10-CM | POA: Insufficient documentation

## 2017-06-05 DIAGNOSIS — Z856 Personal history of leukemia: Secondary | ICD-10-CM | POA: Diagnosis not present

## 2017-06-05 DIAGNOSIS — D4620 Refractory anemia with excess of blasts, unspecified: Secondary | ICD-10-CM | POA: Diagnosis not present

## 2017-06-05 DIAGNOSIS — D61818 Other pancytopenia: Secondary | ICD-10-CM | POA: Diagnosis not present

## 2017-06-05 DIAGNOSIS — D469 Myelodysplastic syndrome, unspecified: Secondary | ICD-10-CM | POA: Insufficient documentation

## 2017-06-05 DIAGNOSIS — D72822 Plasmacytosis: Secondary | ICD-10-CM | POA: Diagnosis not present

## 2017-06-05 LAB — CBC WITH DIFFERENTIAL/PLATELET
BASOS ABS: 0 10*3/uL (ref 0–0.1)
BASOS PCT: 1 %
Eosinophils Absolute: 0.1 10*3/uL (ref 0–0.7)
Eosinophils Relative: 2 %
HEMATOCRIT: 27.2 % — AB (ref 40.0–52.0)
Hemoglobin: 9.2 g/dL — ABNORMAL LOW (ref 13.0–18.0)
Lymphocytes Relative: 16 %
Lymphs Abs: 0.6 10*3/uL — ABNORMAL LOW (ref 1.0–3.6)
MCH: 36.2 pg — ABNORMAL HIGH (ref 26.0–34.0)
MCHC: 33.9 g/dL (ref 32.0–36.0)
MCV: 106.9 fL — ABNORMAL HIGH (ref 80.0–100.0)
MONO ABS: 0.2 10*3/uL (ref 0.2–1.0)
Monocytes Relative: 5 %
NEUTROS ABS: 2.7 10*3/uL (ref 1.4–6.5)
NEUTROS PCT: 76 %
Platelets: 72 10*3/uL — ABNORMAL LOW (ref 150–440)
RBC: 2.55 MIL/uL — AB (ref 4.40–5.90)
RDW: 18.7 % — AB (ref 11.5–14.5)
WBC: 3.6 10*3/uL — AB (ref 3.8–10.6)

## 2017-06-05 LAB — APTT: aPTT: 30 seconds (ref 24–36)

## 2017-06-05 LAB — PROTIME-INR
INR: 0.98
PROTHROMBIN TIME: 12.9 s (ref 11.4–15.2)

## 2017-06-05 IMAGING — CT CT BIOPSY AND ASPIRATION BONE MARROW
1 of 2 series · 10 of 14 positions shown, 13 images · non-contrast
Comparison: none

CLINICAL DATA: History of myelodysplastic bone marrow, multiple
myeloma and previous hairy cell leukemia. Bone marrow biopsy
required to reestablish status of disease.

[Series 2: i-spiral 5.0 b30f · axial · 0.76mm/px · z∈[+654,+752]mm · 10 of 36 slices shown, 13 images]
[im 4/36  soft-tissue]
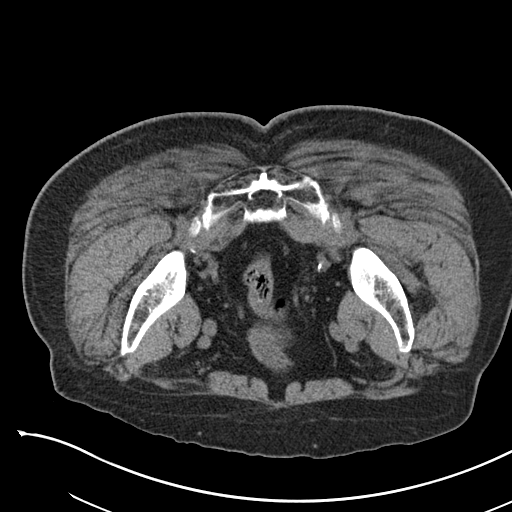
[im 4/36  bone]
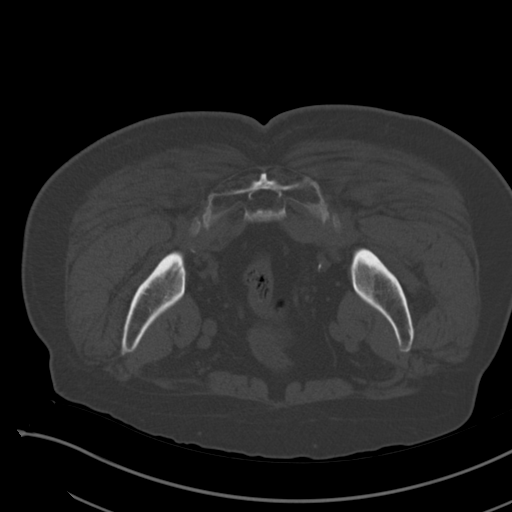
[im 7/36  bone]
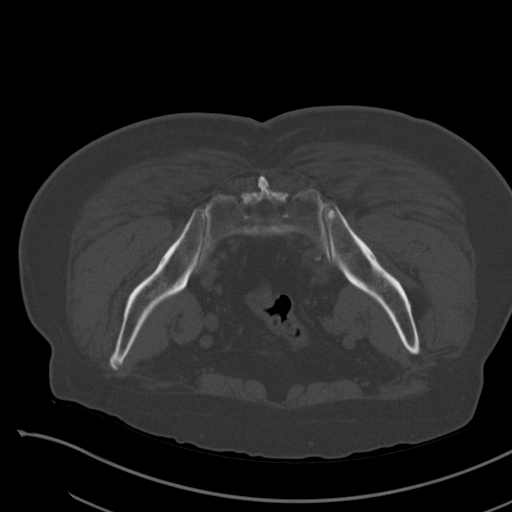
[im 10/36  bone]
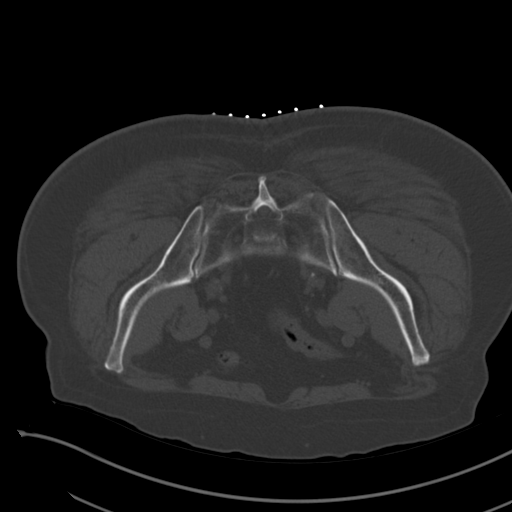
[im 13/36  bone]
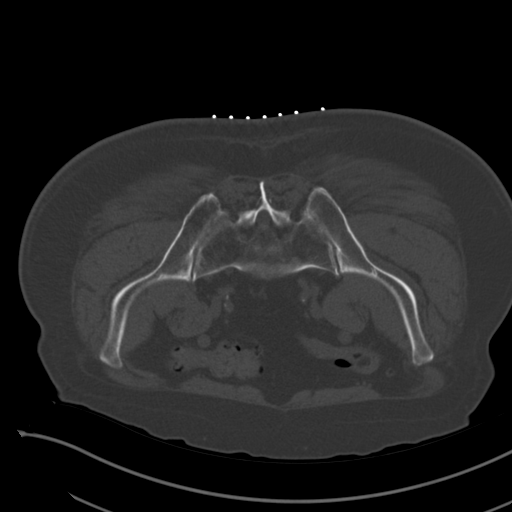
[im 16/36  soft-tissue]
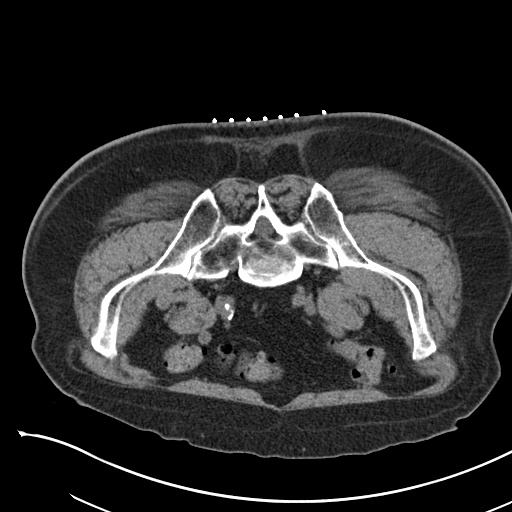
[im 16/36  bone]
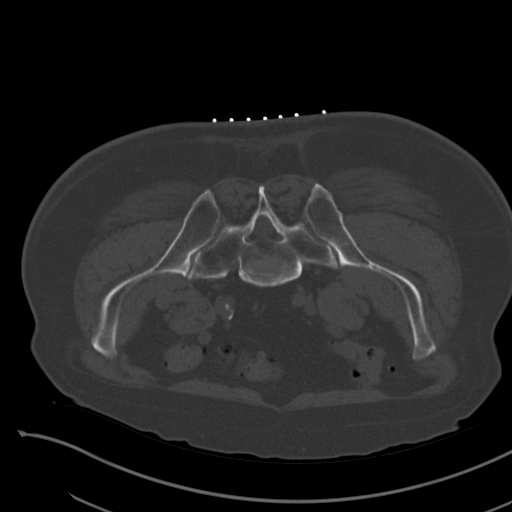
[im 20/36  bone]
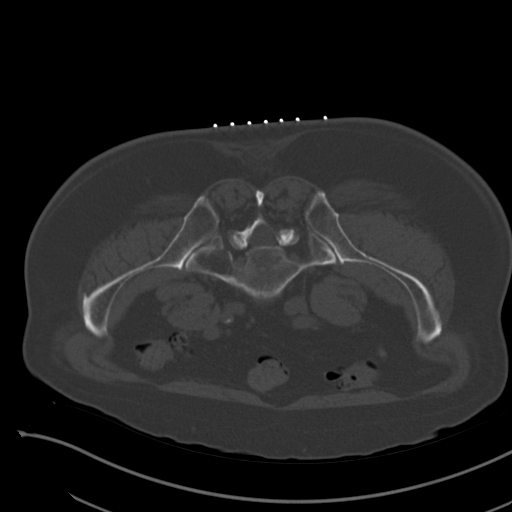
[im 23/36  bone]
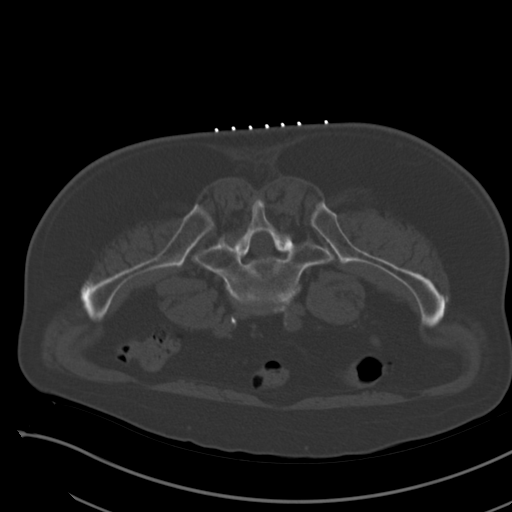
[im 26/36  bone]
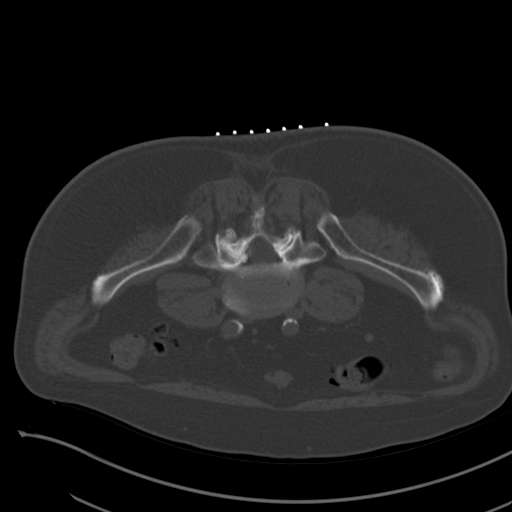
[im 29/36  soft-tissue]
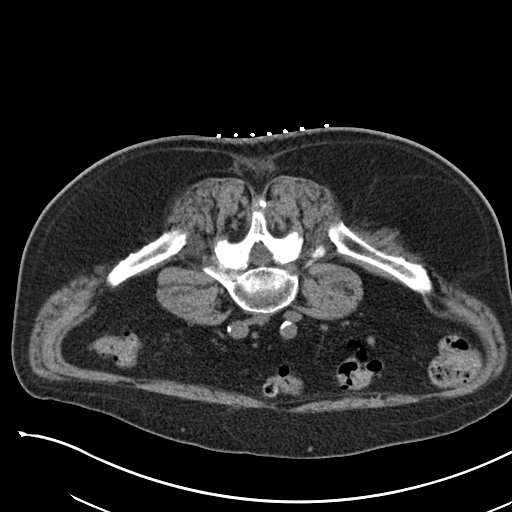
[im 29/36  bone]
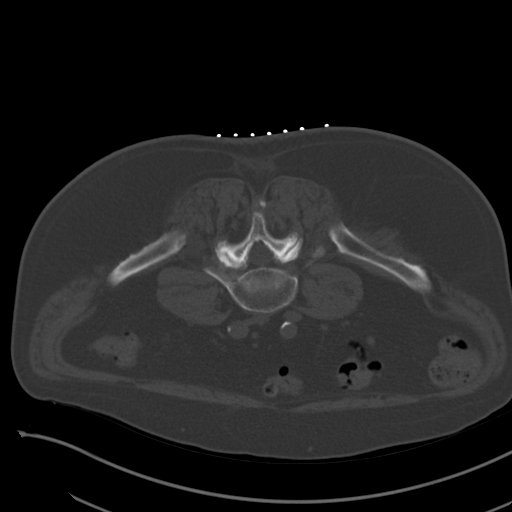
[im 32/36  bone]
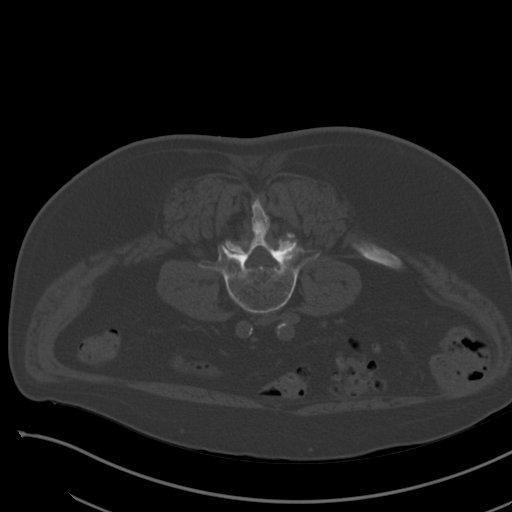

[10 of 14 positions shown; findings below may reference images not displayed]

EXAM:
CT GUIDED BONE MARROW ASPIRATION AND BIOPSY

ANESTHESIA/SEDATION:
Fentanyl 50 mcg IV

Total Moderate Sedation Time:  22 minutes.

The patient's level of consciousness and physiologic status were
continuously monitored during the procedure by Radiology nursing.

PROCEDURE:
The procedure risks, benefits, and alternatives were explained to
the patient. Questions regarding the procedure were encouraged and
answered. The patient understands and consents to the procedure. A
time out was performed prior to initiating the procedure.

The right gluteal region was prepped with chlorhexidine. Sterile
gown and sterile gloves were used for the procedure. Local
anesthesia was provided with 1% Lidocaine.

Under CT guidance, an 11 gauge On Control bone cutting needle was
advanced from a posterior approach into the right iliac bone. Needle
positioning was confirmed with CT. Initial non heparinized and
heparinized aspirate samples were obtained of bone marrow. Core
biopsy was performed via the On Control drill needle.

COMPLICATIONS:
None
FINDINGS: Inspection of initial aspirate did reveal visible particles. Intact
core biopsy sample was obtained.
IMPRESSION: CT guided bone marrow biopsy of right posterior iliac bone with both
aspirate and core samples obtained.

## 2017-06-05 MED ORDER — SODIUM CHLORIDE 0.9 % IV SOLN
INTRAVENOUS | Status: DC
  Administered 2017-06-05: 09:00:00 via INTRAVENOUS

## 2017-06-05 MED ORDER — HEPARIN SOD (PORK) LOCK FLUSH 100 UNIT/ML IV SOLN
INTRAVENOUS | Status: AC
  Filled 2017-06-05: qty 5

## 2017-06-05 MED ORDER — FENTANYL CITRATE (PF) 100 MCG/2ML IJ SOLN
INTRAMUSCULAR | Status: AC | PRN
  Administered 2017-06-05: 50 ug via INTRAVENOUS

## 2017-06-05 MED ORDER — FENTANYL CITRATE (PF) 100 MCG/2ML IJ SOLN
INTRAMUSCULAR | Status: AC
  Filled 2017-06-05: qty 4

## 2017-06-05 NOTE — Progress Notes (Signed)
FSBS at home at [phone]-per patient

## 2017-06-05 NOTE — Procedures (Signed)
Interventional Radiology Procedure Note  Procedure: CT guided aspirate and core biopsy of right iliac bone Complications: None Recommendations: - Bedrest supine x 1 hrs - Follow biopsy results  Marckus Hanover T. Shanan Fitzpatrick, M.D Pager:  [phone]   

## 2017-06-06 ENCOUNTER — Ambulatory Visit (HOSPITAL_BASED_OUTPATIENT_CLINIC_OR_DEPARTMENT_OTHER)
Admission: RE | Admit: 2017-06-06 | Discharge: 2017-06-06 | Disposition: A | Discharge status: Home / Self Care | Payer: Medicare Other | Source: Ambulatory Visit | Attending: Urgent Care | Admitting: Urgent Care

## 2017-06-06 DIAGNOSIS — E119 Type 2 diabetes mellitus without complications: Secondary | ICD-10-CM | POA: Diagnosis not present

## 2017-06-06 DIAGNOSIS — D4620 Refractory anemia with excess of blasts, unspecified: Secondary | ICD-10-CM | POA: Diagnosis not present

## 2017-06-06 DIAGNOSIS — C9000 Multiple myeloma not having achieved remission: Secondary | ICD-10-CM | POA: Diagnosis not present

## 2017-06-06 DIAGNOSIS — E785 Hyperlipidemia, unspecified: Secondary | ICD-10-CM | POA: Diagnosis not present

## 2017-06-06 DIAGNOSIS — Z856 Personal history of leukemia: Secondary | ICD-10-CM | POA: Diagnosis not present

## 2017-06-06 DIAGNOSIS — I119 Hypertensive heart disease without heart failure: Secondary | ICD-10-CM | POA: Diagnosis not present

## 2017-06-06 DIAGNOSIS — K219 Gastro-esophageal reflux disease without esophagitis: Secondary | ICD-10-CM | POA: Diagnosis not present

## 2017-06-06 NOTE — Progress Notes (Signed)
  Echocardiogram 2D Echocardiogram has been performed.  Darlina Sicilian M 06/06/2017, 11:31 AM

## 2017-06-09 ENCOUNTER — Ambulatory Visit: Payer: Medicare Other

## 2017-06-09 ENCOUNTER — Other Ambulatory Visit: Payer: Medicare Other

## 2017-06-10 ENCOUNTER — Encounter: Payer: Self-pay | Admitting: Hematology and Oncology

## 2017-06-10 ENCOUNTER — Other Ambulatory Visit: Payer: Self-pay | Admitting: *Deleted

## 2017-06-10 ENCOUNTER — Inpatient Hospital Stay: Payer: Medicare Other

## 2017-06-10 ENCOUNTER — Inpatient Hospital Stay (HOSPITAL_BASED_OUTPATIENT_CLINIC_OR_DEPARTMENT_OTHER): Payer: Medicare Other | Admitting: Hematology and Oncology

## 2017-06-10 ENCOUNTER — Other Ambulatory Visit: Payer: Self-pay | Admitting: Family Medicine

## 2017-06-10 ENCOUNTER — Other Ambulatory Visit: Payer: Self-pay | Admitting: Hematology and Oncology

## 2017-06-10 VITALS — BP 170/75 | HR 74 | Temp 98.3°F | Resp 18 | Wt 223.3 lb

## 2017-06-10 DIAGNOSIS — Z8719 Personal history of other diseases of the digestive system: Secondary | ICD-10-CM | POA: Diagnosis not present

## 2017-06-10 DIAGNOSIS — Z8619 Personal history of other infectious and parasitic diseases: Secondary | ICD-10-CM

## 2017-06-10 DIAGNOSIS — I10 Essential (primary) hypertension: Secondary | ICD-10-CM | POA: Diagnosis not present

## 2017-06-10 DIAGNOSIS — D649 Anemia, unspecified: Secondary | ICD-10-CM | POA: Diagnosis not present

## 2017-06-10 DIAGNOSIS — Z85828 Personal history of other malignant neoplasm of skin: Secondary | ICD-10-CM | POA: Diagnosis not present

## 2017-06-10 DIAGNOSIS — C9000 Multiple myeloma not having achieved remission: Secondary | ICD-10-CM

## 2017-06-10 DIAGNOSIS — R531 Weakness: Secondary | ICD-10-CM

## 2017-06-10 DIAGNOSIS — E538 Deficiency of other specified B group vitamins: Secondary | ICD-10-CM

## 2017-06-10 DIAGNOSIS — D46Z Other myelodysplastic syndromes: Secondary | ICD-10-CM

## 2017-06-10 DIAGNOSIS — E119 Type 2 diabetes mellitus without complications: Secondary | ICD-10-CM

## 2017-06-10 DIAGNOSIS — K219 Gastro-esophageal reflux disease without esophagitis: Secondary | ICD-10-CM

## 2017-06-10 DIAGNOSIS — Z7689 Persons encountering health services in other specified circumstances: Secondary | ICD-10-CM

## 2017-06-10 DIAGNOSIS — Z79899 Other long term (current) drug therapy: Secondary | ICD-10-CM | POA: Diagnosis not present

## 2017-06-10 DIAGNOSIS — Z7189 Other specified counseling: Secondary | ICD-10-CM

## 2017-06-10 DIAGNOSIS — D4620 Refractory anemia with excess of blasts, unspecified: Secondary | ICD-10-CM

## 2017-06-10 DIAGNOSIS — Z87891 Personal history of nicotine dependence: Secondary | ICD-10-CM | POA: Diagnosis not present

## 2017-06-10 DIAGNOSIS — N400 Enlarged prostate without lower urinary tract symptoms: Secondary | ICD-10-CM | POA: Diagnosis not present

## 2017-06-10 DIAGNOSIS — C9141 Hairy cell leukemia, in remission: Secondary | ICD-10-CM

## 2017-06-10 DIAGNOSIS — Z7982 Long term (current) use of aspirin: Secondary | ICD-10-CM | POA: Diagnosis not present

## 2017-06-10 DIAGNOSIS — Z86010 Personal history of colonic polyps: Secondary | ICD-10-CM

## 2017-06-10 DIAGNOSIS — Z8701 Personal history of pneumonia (recurrent): Secondary | ICD-10-CM

## 2017-06-10 LAB — CBC WITH DIFFERENTIAL/PLATELET
Basophils Absolute: 0 10*3/uL (ref 0–0.1)
Basophils Relative: 1 %
Eosinophils Absolute: 0.1 10*3/uL (ref 0–0.7)
Eosinophils Relative: 4 %
HCT: 25.5 % — ABNORMAL LOW (ref 40.0–52.0)
Hemoglobin: 8.8 g/dL — ABNORMAL LOW (ref 13.0–18.0)
Lymphocytes Relative: 24 %
Lymphs Abs: 0.5 10*3/uL — ABNORMAL LOW (ref 1.0–3.6)
MCH: 36.7 pg — ABNORMAL HIGH (ref 26.0–34.0)
MCHC: 34.5 g/dL (ref 32.0–36.0)
MCV: 106.4 fL — ABNORMAL HIGH (ref 80.0–100.0)
Monocytes Absolute: 0.2 10*3/uL (ref 0.2–1.0)
Monocytes Relative: 8 %
Neutro Abs: 1.4 10*3/uL (ref 1.4–6.5)
Neutrophils Relative %: 63 %
Platelets: 75 10*3/uL — ABNORMAL LOW (ref 150–440)
RBC: 2.4 MIL/uL — ABNORMAL LOW (ref 4.40–5.90)
RDW: 18.7 % — ABNORMAL HIGH (ref 11.5–14.5)
WBC: 2.3 10*3/uL — ABNORMAL LOW (ref 3.8–10.6)

## 2017-06-10 MED ORDER — TBO-FILGRASTIM 480 MCG/0.8ML ~~LOC~~ SOSY
480.0000 ug | PREFILLED_SYRINGE | Freq: Once | SUBCUTANEOUS | Status: AC
  Administered 2017-06-10: 480 ug via SUBCUTANEOUS
  Filled 2017-06-10: qty 0.8

## 2017-06-10 NOTE — Patient Instructions (Signed)
Azacitidine suspension for injection (subcutaneous use) What is this medicine? AZACITIDINE (ay za SITE i deen) is a chemotherapy drug. This medicine reduces the growth of cancer cells and can suppress the immune system. It is used for treating myelodysplastic syndrome or some types of leukemia. This medicine may be used for other purposes; ask your health care provider or pharmacist if you have questions. COMMON BRAND NAME(S): Vidaza What should I tell my health care provider before I take this medicine? They need to know if you have any of these conditions: -kidney disease -liver disease -liver tumors -an unusual or allergic reaction to azacitidine, mannitol, other medicines, foods, dyes, or preservatives -pregnant or trying to get pregnant -breast-feeding How should I use this medicine? This medicine is for injection under the skin. It is administered in a hospital or clinic by a specially trained health care professional. Talk to your pediatrician regarding the use of this medicine in children. While this drug may be prescribed for selected conditions, precautions do apply. Overdosage: If you think you have taken too much of this medicine contact a poison control center or emergency room at once. NOTE: This medicine is only for you. Do not share this medicine with others. What if I miss a dose? It is important not to miss your dose. Call your doctor or health care professional if you are unable to keep an appointment. What may interact with this medicine? Interactions have not been studied. Give your health care provider a list of all the medicines, herbs, non-prescription drugs, or dietary supplements you use. Also tell them if you smoke, drink alcohol, or use illegal drugs. Some items may interact with your medicine. This list may not describe all possible interactions. Give your health care provider a list of all the medicines, herbs, non-prescription drugs, or dietary supplements you  use. Also tell them if you smoke, drink alcohol, or use illegal drugs. Some items may interact with your medicine. What should I watch for while using this medicine? Visit your doctor for checks on your progress. This drug may make you feel generally unwell. This is not uncommon, as chemotherapy can affect healthy cells as well as cancer cells. Report any side effects. Continue your course of treatment even though you feel ill unless your doctor tells you to stop. In some cases, you may be given additional medicines to help with side effects. Follow all directions for their use. Call your doctor or health care professional for advice if you get a fever, chills or sore throat, or other symptoms of a cold or flu. Do not treat yourself. This drug decreases your body's ability to fight infections. Try to avoid being around people who are sick. This medicine may increase your risk to bruise or bleed. Call your doctor or health care professional if you notice any unusual bleeding. You may need blood work done while you are taking this medicine. Do not become pregnant while taking this medicine and for 6 months after the last dose. Women should inform their doctor if they wish to become pregnant or think they might be pregnant. Men should not father a child while taking this medicine and for 3 months after the last dose. There is a potential for serious side effects to an unborn child. Talk to your health care professional or pharmacist for more information. Do not breast-feed an infant while taking this medicine and for 1 week after the last dose. This medicine may interfere with the ability to have a child.   Talk with your doctor or health care professional if you are concerned about your fertility. What side effects may I notice from receiving this medicine? Side effects that you should report to your doctor or health care professional as soon as possible: -allergic reactions like skin rash, itching or hives,  swelling of the face, lips, or tongue -low blood counts - this medicine may decrease the number of white blood cells, red blood cells and platelets. You may be at increased risk for infections and bleeding. -signs of infection - fever or chills, cough, sore throat, pain passing urine -signs of decreased platelets or bleeding - bruising, pinpoint red spots on the skin, black, tarry stools, blood in the urine -signs of decreased red blood cells - unusually weak or tired, fainting spells, lightheadedness -signs and symptoms of kidney injury like trouble passing urine or change in the amount of urine -signs and symptoms of liver injury like dark yellow or brown urine; general ill feeling or flu-like symptoms; light-colored stools; loss of appetite; nausea; right upper belly pain; unusually weak or tired; yellowing of the eyes or skin Side effects that usually do not require medical attention (report to your doctor or health care professional if they continue or are bothersome): -constipation -diarrhea -nausea, vomiting -pain or redness at the injection site -unusually weak or tired This list may not describe all possible side effects. Call your doctor for medical advice about side effects. You may report side effects to FDA at 1-800-FDA-1088. Where should I keep my medicine? This drug is given in a hospital or clinic and will not be stored at home. NOTE: This sheet is a summary. It may not cover all possible information. If you have questions about this medicine, talk to your doctor, pharmacist, or health care provider.  2018 Elsevier/Gold Standard (2016-09-03 14:37:51)  

## 2017-06-10 NOTE — Progress Notes (Signed)
Here for follow up

## 2017-06-10 NOTE — Progress Notes (Signed)
[facility] day:  06/10/2017    Chief Complaint: Alex Smith is a 82 y.o. male with a myelodysplastic marrow, smoldering myeloma, and a history of hairy cell leukemia who is seen for review of interval bone marrow and echocardiogram and discussion regarding direction of therapy.  HPI:  The patient was last seen in the medical oncology clinic on 05/27/2017.  At that time, he felt fatigued.  Exam revealed a grade III systolic murmur. He had bilateral lower extremity edema (L>R).  Hematocrit was 26.3, hemoglobin 9.0, platelets 61,000, and WBC 1500 with ANC 800.  We discussed discontinuation of Revlimid, repeat bone marrow biopsy, and initiation of Vidaza.  He has continued weekly GCSF.  He underwent CT guided bone marrow aspirate and biopsy on 06/05/2017.  Awaiting pathology results.  Flow cytometry revealed an increased population of plasma cells (9%), definitive clonality not established.  Echo on 06/06/2017 revealed an EF of 65-70%.  Mitral valve was calcified.  The left atrium was mildly dilated.  In the interim, patient has been doing "ok".  Patient complaints of bilateral lower extremity weakness.  He denies pain. He has exertional dyspnea. He denies chest pain and palpitations.  He denies any B symptoms or interval infections.  Patient is eating well, with no significant weight loss.    Past Medical History:  Diagnosis Date  . Anemia   . B12 deficiency   . Basal cell carcinoma of face   . BPH (benign prostatic hypertrophy)    followed by urology, discharged (Dr. Bernardo Heater)  . CAP (community acquired pneumonia) 02/15/2015  . Colon polyps   . Diverticulosis   . GERD (gastroesophageal reflux disease)   . Hairy cell leukemia ([place]) 2006   recurrent, seizure on rituxan, now on cladribine ([place])  . History of pneumonia 2000's   "once" (07/07/2012)  . History of shingles   . HLD (hyperlipidemia)   . Hypertension   . Pneumonia   .  Shortness of breath dyspnea   . Systolic murmur 01/74/9449  . Type 2 diabetes, controlled, with retinopathy ([place])     Past Surgical History:  Procedure Laterality Date  . [place] VITRECTOMY WITH 20 GAUGE MVR PORT FOR MACULAR HOLE  07/07/2012   Procedure: 25 GAUGE PARS PLANA VITRECTOMY WITH 20 GAUGE MVR PORT FOR MACULAR HOLE;  Surgeon: Hayden Pedro, MD;  Location: [facility];  Service: Ophthalmology;  Laterality: Left;  . BONE MARROW BIOPSY  2016  . CARDIOVASCULAR STRESS TEST  2013   treadmill - no evidence ischemia, EF 61%  . CATARACT EXTRACTION W/ INTRAOCULAR LENS  IMPLANT, BILATERAL  ~ 2010  . COLONOSCOPY  2014   [place] WNL no rpt needed, h/o polyps  . EYE SURGERY Left 06/2012   laser surgery  . GAS INSERTION  07/07/2012   Procedure: INSERTION OF GAS;  Surgeon: Hayden Pedro, MD;  Location: [facility];  Service: Ophthalmology;  Laterality: Left;  [id number]  . PERIPHERAL VASCULAR CATHETERIZATION N/A 02/23/2015   Procedure: Glori Luis Cath Insertion;  Surgeon: Algernon Huxley, MD;  Location: [facility];  Service: Cardiovascular;  Laterality: N/A;  . SERUM PATCH  07/07/2012   Procedure: SERUM PATCH;  Surgeon: Hayden Pedro, MD;  Location: [facility];  Service: Ophthalmology;  Laterality: Left;  . SKIN CANCER EXCISION     "all over my face" (07/07/2012)    Family History  Problem Relation Age of Onset  . Dementia Mother   .  Heart failure Father 51  . Cancer Sister        breast  . Diabetes Paternal Uncle   . Diabetes Paternal Aunt   . CAD Brother 66       MI  . Stroke Neg Hx     Social History:  reports that he quit smoking about 48 years ago. His smoking use included Cigarettes. He has a 50.00 pack-year smoking history. He has never used smokeless tobacco. He reports that he drinks alcohol. He reports that he does not use drugs.  He works at [employer] 20-32 hours/week.  The patient 's wife has had 3 hip operations.  She underwent hip replacement 03/06/2016.  His wife is now in a  nursing home.  He states that they either eat out or bring food in.  His best contact number is his cell: [phone].  He is accompanied by his daughter [name]) today.  Allergies:  Allergies  Allergen Reactions  . Rituximab Rash    Chest tightness Chest tightness  . Blood-Group Specific Substance Other (See Comments)    Had a post transfusion reaction of red blood cells; NOW REQUIRES WASHED BLOOD CELLS Had a post transfusion reaction of red blood cells; NOW REQUIRES WASHED BLOOD CELLS  . Primaxin [Imipenem] Other (See Comments)    Possible allergy Possible allergy  . Voriconazole Other (See Comments)  . Sulfa Antibiotics Itching and Rash  . Sulfacetamide Sodium Itching and Rash  Possible allergy to Zoloft, allopurinol, and a chemotherapy medication.  He tolerates Dapsone.  Current Medications: Current Outpatient Prescriptions  Medication Sig Dispense Refill  . ACCU-CHEK AVIVA PLUS test strip USE TO CHECK SUGAR TWICE DAILY 200 each 0  . acyclovir (ZOVIRAX) 400 MG tablet TAKE TWO TABLETS BY MOUTH TWICE DAILY 120 tablet 0  . aspirin EC 81 MG tablet Take 81 mg by mouth daily.    Marland Kitchen HUMULIN R 100 UNIT/ML injection INJECT 10 UNITS SUBCUTANEOUSLY TWICE DAILY BEFORE MEALS 10 mL 6  . hydrochlorothiazide (HYDRODIURIL) 25 MG tablet Take 1 tablet (25 mg total) by mouth daily. 30 tablet 6  . insulin NPH Human (HUMULIN N) 100 UNIT/ML injection Inject 0.39 mLs (39 Units total) into the skin 2 (two) times daily before a meal. 20 mL 6  . lisinopril (PRINIVIL,ZESTRIL) 20 MG tablet TAKE 1 TABLET BY MOUTH TWICE DAILY 60 tablet 6  . metFORMIN (GLUCOPHAGE) 1000 MG tablet TAKE ONE TABLET BY MOUTH TWICE DAILY AS DIRECTED 180 tablet 1  . MICROLET LANCETS MISC USE AS DIRECTED 200 each 3  . pravastatin (PRAVACHOL) 20 MG tablet TAKE ONE TABLET BY MOUTH AT BEDTIME 30 tablet 0  . ranitidine (ZANTAC) 150 MG tablet TAKE 1 TABLET BY MOUTH TWICE DAILY AS NEEDED 180 tablet 3  . tamsulosin (FLOMAX) 0.4 MG CAPS capsule  TAKE 1 CAPSULE BY MOUTH EVERY DAY 30 capsule 1  . vitamin E 400 UNIT capsule Take 200 Units by mouth daily.     Marland Kitchen lenalidomide (REVLIMID) 5 MG capsule 5 mg a day for 21 days then 7 days off (Patient not taking: Reported on 05/27/2017) 21 capsule 0  . lidocaine-prilocaine (EMLA) cream Apply 1 application topically as needed. (Patient not taking: Reported on 05/27/2017) 30 g 3  . temazepam (RESTORIL) 7.5 MG capsule Take 7.5 mg by mouth once.     . traZODone (DESYREL) 50 MG tablet TAKE 1/2 TO 1 TABLET BY MOUTH AT [facility] NEEDED FOR SLEEP (Patient not taking: Reported on 05/27/2017) 30 tablet 0   Current Facility-Administered  Medications  Medication Dose Route Frequency Provider Last Rate Last Dose  . Tbo-Filgrastim (GRANIX) injection 480 mcg  480 mcg Subcutaneous Once Lequita Asal, MD       Facility-Administered Medications Ordered in Other Visits  Medication Dose Route Frequency Provider Last Rate Last Dose  . heparin lock flush 100 unit/mL  500 Units Intravenous Once [place], Melissa C, MD      . sodium chloride 0.9 % injection 10 mL  10 mL Intravenous PRN Lequita Asal, MD   10 mL at 03/03/15 0903  . sodium chloride 0.9 % injection 10 mL  10 mL Intracatheter PRN Lequita Asal, MD   10 mL at 03/10/15 1410   Review of Systems:  GENERAL:  Feels "ok". "Weak when he does a lot".  No fevers or sweats.  Weight up 3 pounds. PERFORMANCE STATUS (ECOG):  1 HEENT: No visual changes, runny nose, sore throat. Pulmonary:  Shortness of breath on exertion.  No cough.  No hemoptysis. Cardiac:  No chest pain, palpitations, orthopnea, or PND.  Heart murmur. GI:  Appetite good.  No nausea, vomiting, diarrhea, constipation, melena or hematochezia.  GU:  No urgency, frequency, dysuria, or hematuria.  On Flomax. Musculoskeletal:  No back pain.  No joint pain.  No muscle tenderness. Extremities:  No pain or swelling. Skin:  No rashes or skin changes. Neuro:  No headache, numbness or weakness,  balance or coordination issues. Endocrine:  Diabetes.  No thyroid issues, hot flashes or night sweats. Psych:  No mood changes, depression or anxiety.  Pain:  No focal pain. Review of systems:  All other systems reviewed and found to be negative.   Physical Exam: Blood pressure (!) 170/75, pulse 74, temperature 98.3 F (36.8 C), temperature source Tympanic, resp. rate 18, weight 223 lb 4.8 oz (101.3 kg).  GENERAL:  Well developed, well nourished, gentleman sitting comfortably in the exam room in no acute distress. MENTAL STATUS:  Alert and oriented to person, place and time. HEAD:  Pearline Cables white hair.  Normocephalic, atraumatic, face symmetric, no Cushingoid features. EYES:  Glasses. Blue eyes.  Pupils equal round and reactive to light and accomodation.  No conjunctivitis or scleral icterus. ENT:  Oropharynx clear without lesion.  Tongue normal. Mucous membranes moist.  RESPIRATORY:  Clear to auscultation without rales, wheezes or rhonchi. CARDIOVASCULAR:  Grade III systolic murmur (stable). Regular rate and rhythm without, rub or gallop. ABDOMEN:  Soft, non-tender, with active bowel sounds, and no hepatosplenomegaly.  No masses. SKIN:  Right sided sacral bruise s/p bone marrow biopsy.  No rashes or ulcers. EXTREMITIES: Bilateral lower extremity edema (L>R).  No skin discoloration or tenderness.  No palpable cords. LYMPH NODES: No palpable cervical, supraclavicular, axillary or inguinal adenopathy  NEUROLOGICAL: Unremarkable. PSYCH:  Appropriate.    Appointment on 06/10/2017  Component Date Value Ref Range Status  . WBC 06/10/2017 2.3* 3.8 - 10.6 K/uL Final  . RBC 06/10/2017 2.40* 4.40 - 5.90 MIL/uL Final  . Hemoglobin 06/10/2017 8.8* 13.0 - 18.0 g/dL Final  . HCT 06/10/2017 25.5* 40.0 - 52.0 % Final  . MCV 06/10/2017 106.4* 80.0 - 100.0 fL Final  . MCH 06/10/2017 36.7* 26.0 - 34.0 pg Final  . MCHC 06/10/2017 34.5  32.0 - 36.0 g/dL Final  . RDW 06/10/2017 18.7* 11.5 - 14.5 % Final  .  Platelets 06/10/2017 75* 150 - 440 K/uL Final  . Neutrophils Relative % 06/10/2017 63  % Final  . Neutro Abs 06/10/2017 1.4  1.4 - 6.5  K/uL Final  . Lymphocytes Relative 06/10/2017 24  % Final  . Lymphs Abs 06/10/2017 0.5* 1.0 - 3.6 K/uL Final  . Monocytes Relative 06/10/2017 8  % Final  . Monocytes Absolute 06/10/2017 0.2  0.2 - 1.0 K/uL Final  . Eosinophils Relative 06/10/2017 4  % Final  . Eosinophils Absolute 06/10/2017 0.1  0 - 0.7 K/uL Final  . Basophils Relative 06/10/2017 1  % Final  . Basophils Absolute 06/10/2017 0.0  0 - 0.1 K/uL Final    Assessment:  KEROLOS NEHME is a 82 y.o. 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  He was diagnosed with hairy cell leukemia in 2006.  He received cladribine 11/06-11/05/2005.  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   Bone marrow aspirate and biopsy on 02/07/2015 revealed a extensive marrow involvement by recurrent hairy cell leukemia (approximately 80% of cells in the core). There was a monoclonal plasma cell infiltrate (approximately 10%), compatible with a plasma cell neoplasm. Marrow was hypercellular (40-50%) with markedly diminished residual trilineage hematopoiesis and mild multilineage dyspoiesis.  Peripheral smear revealed leukopenia (WBC 1900) with 2% atypical lymphoid cells compatible with hairy cell leukemia. There was moderate anemia with anisopoikilocytosis. 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  FISH studies for MDS were negative.  Cytogenetics were normal (46, XY).  He has an unclear allergy history.  He may be allergic to Primaxin, Zoloft, allopurinol, and a chemotherapy medication.  Reaction occurred in 06/2005.  He is allergic to sulfa.  He is not allergic to dapsone.  He requires washed RBCs.  He is on prophylactic acyclovir.  Dapsone and voriconazole were discontinued.  He has a history of shingles prior to prophylactic  acyclovir.  He is 17 months status post cladribine (began 03/03/2015).  His counts plateaued on 07/25/2015 and have subsequently decreased.  Labs on 07/25/2015 revealed a normal B12 (894), folate (60.5), and TSH (2.562).  Ferritin was 50 on 04/26/2016.  Labs on 08/22/2016 revealed a normal B12, folate, and TSH.  He is on oral B12.  B12 was 693 on 05/27/2017.  Bone marrow on 11/03/2015 revealed persistent plasma cell neoplasm, now with mildy atypical monoclonal plasma cells estimated at 20-30%.   There was no morphologic evidence of residual hairy cell leukemia.  There was a minute population (0.03%) with hairy cell immunophenotype detected by flow.  Marrow was normocellular for age with relative erythroid hyperplasia, relative myeloid hypoplasia, mild dyspoiesis, adequate megakaryocytes and no increased blasts.  There was no significant increase in marrow reticulin fibers.  Iron was present.  FISH studies for MDS were negative.  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  Cytogenetics were normal (46, XY).  Bone marrow on 03/13/2016 was normocellular to marginally hypercellular for age (20-40%) with relative erythroid hyperplasia and mild dyserythropoiesis, decreased granulopoiesis and adequate megakaryocytes.  There was a persistent 15-20% plasma cell neoplasm.  There was no overt increase in reticulin fibrosis.  Storage iron was present.  Flow cytometry revealed abnormal/monocytic plasma cell population (3% sample), consistent with a plasma cell neoplastic process.  Cytogenetics were normal (46, XY).  FISH studies for myeloma revealed a CCND1/IGH translocation, t(11;14) in 53% of nuclei.  SPEP has been followed: 1.0 on 02/03/2015, 1.4 gm/dL on 07/25/2015, 1.3 gm/dL on 11/17/2015, 1.2 gm/dL on 06/28/2016, 1.0 gm/dL on 09/20/2016, 0.8 gm/dL on 04/29/2017, and 0.7 gm/dl [facility]) on 05/12/2017.    Kappa free  light chains have been followed: 47.63 (ratio 3.12) on  02/03/2015, 54.9 (ratio 3.14) on 02/07/2015, 46.23 (ratio 3.68) on 07/25/2015, and 50.5 (ratio 4.82) on 11/17/2015.  Beta2-microglobulin was 2.5 on 11/17/2015.  24 hour urine on 11/21/2015 revealed 234.8 mg protein in 24 hours (no monoclonal protein).  Bone survey on 11/24/2015 revealed no lytic lesions.  Epo level was 329.6 on 03/29/2016.  He began a trial of Procrit.  He received 10,000 units of Procrit weekly (04/26/2016 - 06/14/2016).  Hemoglobin ranged between 8.4 and 8.9 without trend.    He is s/p 1 cycle ofRevlimid (10 mg) and Decadron(06/29/2016 - 07/19/2016). Cycle #1 was complicated by anemia and neutropenia. He has received 4 units of PRBCs(1 unit:07/22/2016 and 2 units: 08/07/2016, 1 unit on 01/14/2017, and 1 unit on 02/27/2017; 1 unit on 04/30/2017). He received 6 days ofGCSF.  He is s/p 5 cycles of Revlimid (5 mg) and Decadron (09/14/2016 - 04/01/2017).  Revlimid was on temporary hold beginning 03/04/2017.  Revlimid was discontinued on 05/27/2017.  He began weekly GCSF on 11/05/2016.  He receives GCSF if his ANC is <= 1500.  He is on aspirin 81 mg a day prophylaxis.  PET scan on 03/14/2016 revealed no evidence of hypermetabolic lymphadenopathy or focal skeletal lesions to suggest marrow or cortical bone involvement.  Echo on 06/06/2017 revealed an EF of 65-70%.  Mitral valve was calcified.  The left atrium was mildly dilated.  Symptomatically, he feels "ok".  Exam reveals a grade III systolic murmur.  He has bilateral lower extremity edema (L>R).  Counts include a hematocrit 25.5, hemoglobin of 8.8, platelets 75,000, and WBC 2300 with ANC 1400.  Plan: 1.  Labs today:  CBC with diff. 2.  Review interval bone marrow. Awaiting all of his final pathology results for review.  3.  Review interval echocardiogram. EF 65-70%. 4.  Continue weekly GCSF for ANC . 5.  Discuss anemia. Patient fatigued with no chest pain or shortness of breath.  Last PRBC transfusion was on  04/30/2017. Anticipate the need for transfusion within the next week. 6.  Discuss use of Vidaza for MDS as recommended by Dr. Evelene Croon. He is aware that he may require more frequent blood transfusions initially. Patient in agreement. Awaiting bone marrow biopsy results. Futures trader provided on [employer] today. Contact patient once final reports are back. 7.  RTC weekly for labs (CBC with diff) +/- GCSF  8.  RTC on 06/23/2017 for MD assessment, labs (CBC with diff, CMP, Mg), Vidaza, and +/- GCSF.   Honor Loh, NP  06/10/2017, 12:06 PM   I saw and evaluated the patient, participating in the key portions of the service and reviewing pertinent diagnostic studies and records.  I reviewed the nurse practitioner's note and agree with the findings and the plan.  The assessment and plan were discussed with the patient.  Multiple questions were asked by the patient and answered.   Lequita Asal, MD  06/10/2017, 12:06 PM

## 2017-06-12 ENCOUNTER — Other Ambulatory Visit: Payer: Self-pay | Admitting: Family Medicine

## 2017-06-16 ENCOUNTER — Inpatient Hospital Stay: Payer: Medicare Other

## 2017-06-16 ENCOUNTER — Other Ambulatory Visit: Payer: Self-pay | Admitting: Family Medicine

## 2017-06-16 ENCOUNTER — Ambulatory Visit (INDEPENDENT_AMBULATORY_CARE_PROVIDER_SITE_OTHER): Payer: Medicare Other | Admitting: Family Medicine

## 2017-06-16 ENCOUNTER — Encounter: Payer: Self-pay | Admitting: Family Medicine

## 2017-06-16 VITALS — BP 140/70 | HR 87 | Temp 97.9°F | Wt 222.0 lb

## 2017-06-16 DIAGNOSIS — C9000 Multiple myeloma not having achieved remission: Secondary | ICD-10-CM

## 2017-06-16 DIAGNOSIS — Z87891 Personal history of nicotine dependence: Secondary | ICD-10-CM | POA: Diagnosis not present

## 2017-06-16 DIAGNOSIS — R531 Weakness: Secondary | ICD-10-CM | POA: Diagnosis not present

## 2017-06-16 DIAGNOSIS — N400 Enlarged prostate without lower urinary tract symptoms: Secondary | ICD-10-CM | POA: Diagnosis not present

## 2017-06-16 DIAGNOSIS — D4620 Refractory anemia with excess of blasts, unspecified: Secondary | ICD-10-CM | POA: Diagnosis not present

## 2017-06-16 DIAGNOSIS — E538 Deficiency of other specified B group vitamins: Secondary | ICD-10-CM | POA: Diagnosis not present

## 2017-06-16 DIAGNOSIS — R011 Cardiac murmur, unspecified: Secondary | ICD-10-CM | POA: Diagnosis not present

## 2017-06-16 DIAGNOSIS — E119 Type 2 diabetes mellitus without complications: Secondary | ICD-10-CM | POA: Diagnosis not present

## 2017-06-16 DIAGNOSIS — I10 Essential (primary) hypertension: Secondary | ICD-10-CM

## 2017-06-16 DIAGNOSIS — K219 Gastro-esophageal reflux disease without esophagitis: Secondary | ICD-10-CM | POA: Diagnosis not present

## 2017-06-16 DIAGNOSIS — Z794 Long term (current) use of insulin: Secondary | ICD-10-CM | POA: Diagnosis not present

## 2017-06-16 DIAGNOSIS — C9141 Hairy cell leukemia, in remission: Secondary | ICD-10-CM | POA: Diagnosis not present

## 2017-06-16 DIAGNOSIS — Z7982 Long term (current) use of aspirin: Secondary | ICD-10-CM | POA: Diagnosis not present

## 2017-06-16 DIAGNOSIS — Z7689 Persons encountering health services in other specified circumstances: Secondary | ICD-10-CM | POA: Diagnosis not present

## 2017-06-16 DIAGNOSIS — Z79899 Other long term (current) drug therapy: Secondary | ICD-10-CM | POA: Diagnosis not present

## 2017-06-16 DIAGNOSIS — Z8719 Personal history of other diseases of the digestive system: Secondary | ICD-10-CM | POA: Diagnosis not present

## 2017-06-16 DIAGNOSIS — Z8619 Personal history of other infectious and parasitic diseases: Secondary | ICD-10-CM | POA: Diagnosis not present

## 2017-06-16 DIAGNOSIS — D649 Anemia, unspecified: Secondary | ICD-10-CM | POA: Diagnosis not present

## 2017-06-16 DIAGNOSIS — E11319 Type 2 diabetes mellitus with unspecified diabetic retinopathy without macular edema: Secondary | ICD-10-CM

## 2017-06-16 DIAGNOSIS — Z86010 Personal history of colonic polyps: Secondary | ICD-10-CM | POA: Diagnosis not present

## 2017-06-16 DIAGNOSIS — Z85828 Personal history of other malignant neoplasm of skin: Secondary | ICD-10-CM | POA: Diagnosis not present

## 2017-06-16 LAB — CBC WITH DIFFERENTIAL/PLATELET
Basophils Absolute: 0 10*3/uL (ref 0–0.1)
Basophils Relative: 1 %
Eosinophils Absolute: 0.1 10*3/uL (ref 0–0.7)
Eosinophils Relative: 4 %
HCT: 26.7 % — ABNORMAL LOW (ref 40.0–52.0)
Hemoglobin: 9.3 g/dL — ABNORMAL LOW (ref 13.0–18.0)
Lymphocytes Relative: 22 %
Lymphs Abs: 0.5 10*3/uL — ABNORMAL LOW (ref 1.0–3.6)
MCH: 37.1 pg — ABNORMAL HIGH (ref 26.0–34.0)
MCHC: 34.9 g/dL (ref 32.0–36.0)
MCV: 106.3 fL — ABNORMAL HIGH (ref 80.0–100.0)
Monocytes Absolute: 0.3 10*3/uL (ref 0.2–1.0)
Monocytes Relative: 14 %
Neutro Abs: 1.5 10*3/uL (ref 1.4–6.5)
Neutrophils Relative %: 59 %
Platelets: 88 10*3/uL — ABNORMAL LOW (ref 150–440)
RBC: 2.51 MIL/uL — ABNORMAL LOW (ref 4.40–5.90)
RDW: 18.4 % — ABNORMAL HIGH (ref 11.5–14.5)
WBC: 2.5 10*3/uL — ABNORMAL LOW (ref 3.8–10.6)

## 2017-06-16 LAB — COMPREHENSIVE METABOLIC PANEL
ALT: 19 U/L (ref 17–63)
AST: 24 U/L (ref 15–41)
Albumin: 4 g/dL (ref 3.5–5.0)
Alkaline Phosphatase: 66 U/L (ref 38–126)
Anion gap: 9 (ref 5–15)
BUN: 23 mg/dL — ABNORMAL HIGH (ref 6–20)
CO2: 23 mmol/L (ref 22–32)
Calcium: 9.2 mg/dL (ref 8.9–10.3)
Chloride: 105 mmol/L (ref 101–111)
Creatinine, Ser: 1.28 mg/dL — ABNORMAL HIGH (ref 0.61–1.24)
GFR calc Af Amer: 59 mL/min — ABNORMAL LOW (ref >60)
GFR calc non Af Amer: 51 mL/min — ABNORMAL LOW (ref >60)
Glucose, Bld: 245 mg/dL — ABNORMAL HIGH (ref 65–99)
Potassium: 4 mmol/L (ref 3.5–5.1)
Sodium: 137 mmol/L (ref 135–145)
Total Bilirubin: 1 mg/dL (ref 0.3–1.2)
Total Protein: 6.8 g/dL (ref 6.5–8.1)

## 2017-06-16 LAB — MAGNESIUM: Magnesium: 1.8 mg/dL (ref 1.7–2.4)

## 2017-06-16 NOTE — Telephone Encounter (Signed)
Has appt today @ 3:30.

## 2017-06-16 NOTE — Telephone Encounter (Signed)
Refilled at today's appt

## 2017-06-16 NOTE — Assessment & Plan Note (Signed)
Recent echo stable.

## 2017-06-16 NOTE — Assessment & Plan Note (Signed)
Chronic. Worse with revlimid/decadron, now improvement noted off decadron. Recent BMP glu was very high however pt endorses better readings at home. Will check A1c and fructosamine today. I will ask him to bring sugar log to next office visit to review.

## 2017-06-16 NOTE — Assessment & Plan Note (Signed)
Chronic, adequate °

## 2017-06-16 NOTE — Assessment & Plan Note (Signed)
Was on revlimid/decadron, now off this due to worsening pancytopenia. Pending trial Vidaza with bone marrow biopsy prior to commencement.

## 2017-06-16 NOTE — Progress Notes (Signed)
BP 140/70 (BP Location: Left Arm, Patient Position: Sitting, Cuff Size: Normal)   Pulse 87   Temp 97.9 F (36.6 C) (Oral)   Wt 222 lb (100.7 kg)   SpO2 95%   BMI 34.77 kg/m    CC: 3 mo f/u visit Subjective:    Patient ID: Alex Smith, male    DOB: 1935/05/11, 82 y.o.   MRN: [id number]  HPI: Alex Smith is a 82 y.o. male presenting on 06/16/2017 for 3 mo follow-up   Latest onc note reviewed. 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 Revlimid was discontinued due to pancytopenia and marked fatigue. Planning to start on [place] for MDS as recommended by Dr Evelene Croon at [facility]. He also has been receiving weekly GCSF.   DM - does regularly check sugars fasting: 128 this morning. Endorses overall good control of sugars at home. Compliant with antihyperglycemic regimen which includes: see below. Denies low sugars or hypoglycemic symptoms. Denies paresthesias. Last diabetic eye exam 01/2017. Pneumovax: 2006. Prevnar: 2015. Glucometer brand: thinks accucheck.  Lab Results  Component Value Date   HGBA1C 7.0 (H) 12/11/2016   Diabetic Foot Exam - Simple   No data filed     Lab Results  Component Value Date   MICROALBUR 1.0 10/05/2014  Current regimen: Humulin R 10 u bid with meals Humulin N NPH 39 u bid Metformin '1000mg'$  bid    Relevant past medical, surgical, family and social history reviewed and updated as indicated. Interim medical history since our last visit reviewed. Allergies and medications reviewed and updated. Outpatient Medications Prior to Visit  Medication Sig Dispense Refill  . ACCU-CHEK AVIVA PLUS test strip USE TO CHECK SUGAR TWICE DAILY 200 each 0  . acyclovir (ZOVIRAX) 400 MG tablet TAKE TWO TABLETS BY MOUTH TWICE DAILY 120 tablet 0  . aspirin EC 81 MG tablet Take 81 mg by mouth daily.    Marland Kitchen HUMULIN N 100 UNIT/ML injection INJECT 39 UNITS SUBCUTANEOUSLY TWICE DAILY BEFORE A MEAL 20 mL 6  . HUMULIN R 100 UNIT/ML injection INJECT  10 UNITS SUBCUTANEOUSLY TWICE DAILY BEFORE MEALS 10 mL 6  . hydrochlorothiazide (HYDRODIURIL) 25 MG tablet Take 1 tablet (25 mg total) by mouth daily. 30 tablet 6  . lidocaine-prilocaine (EMLA) cream Apply 1 application topically as needed. 30 g 3  . lisinopril (PRINIVIL,ZESTRIL) 20 MG tablet TAKE 1 TABLET BY MOUTH TWICE DAILY 60 tablet 6  . metFORMIN (GLUCOPHAGE) 1000 MG tablet TAKE ONE TABLET BY MOUTH TWICE DAILY AS DIRECTED 180 tablet 1  . MICROLET LANCETS MISC USE AS DIRECTED 200 each 3  . pravastatin (PRAVACHOL) 20 MG tablet TAKE ONE TABLET BY MOUTH AT BEDTIME 30 tablet 0  . ranitidine (ZANTAC) 150 MG tablet TAKE 1 TABLET BY MOUTH TWICE DAILY AS NEEDED 180 tablet 3  . tamsulosin (FLOMAX) 0.4 MG CAPS capsule TAKE 1 CAPSULE BY MOUTH EVERY DAY 30 capsule 1  . temazepam (RESTORIL) 7.5 MG capsule Take 7.5 mg by mouth once.     . traZODone (DESYREL) 50 MG tablet TAKE 1/2 TO 1 TABLET BY MOUTH AT BEDTIMEAS NEEDED FOR SLEEP 30 tablet 0  . vitamin E 400 UNIT capsule Take 200 Units by mouth daily.     Marland Kitchen lenalidomide (REVLIMID) 5 MG capsule 5 mg a day for 21 days then 7 days off 21 capsule 0   Facility-Administered Medications Prior to Visit  Medication Dose Route Frequency Provider Last Rate Last Dose  . heparin lock  flush 100 unit/mL  500 Units Intravenous Once Corcoran, Melissa C, MD      . sodium chloride 0.9 % injection 10 mL  10 mL Intravenous PRN Rosey Bath, MD   10 mL at 03/03/15 0903  . sodium chloride 0.9 % injection 10 mL  10 mL Intracatheter PRN Nelva Nay C, MD   10 mL at 03/10/15 1410  . Tbo-Filgrastim (GRANIX) injection 480 mcg  480 mcg Subcutaneous Once Rosey Bath, MD         Per HPI unless specifically indicated in ROS section below Review of Systems     Objective:    BP 140/70 (BP Location: Left Arm, Patient Position: Sitting, Cuff Size: Normal)   Pulse 87   Temp 97.9 F (36.6 C) (Oral)   Wt 222 lb (100.7 kg)   SpO2 95%   BMI 34.77 kg/m   Wt  Readings from Last 3 Encounters:  06/16/17 222 lb (100.7 kg)  06/10/17 223 lb 4.8 oz (101.3 kg)  05/27/17 220 lb 11.2 oz (100.1 kg)    Physical Exam  Constitutional: He appears well-developed and well-nourished. No distress.  HENT:  Mouth/Throat: Oropharynx is clear and moist. No oropharyngeal exudate.  Cardiovascular: Normal rate, regular rhythm and intact distal pulses.   Murmur (3/6 systolic) heard. Pulmonary/Chest: Effort normal and breath sounds normal. No respiratory distress. He has no wheezes. He has no rales.  Musculoskeletal: He exhibits no edema.  Psychiatric: He has a normal mood and affect.  Nursing note and vitals reviewed.  Results for orders placed or performed in visit on 06/16/17  CBC with Differential  Result Value Ref Range   WBC 2.5 (L) 3.8 - 10.6 K/uL   RBC 2.51 (L) 4.40 - 5.90 MIL/uL   Hemoglobin 9.3 (L) 13.0 - 18.0 g/dL   HCT 84.8 (L) 85.8 - 41.8 %   MCV 106.3 (H) 80.0 - 100.0 fL   MCH 37.1 (H) 26.0 - 34.0 pg   MCHC 34.9 32.0 - 36.0 g/dL   RDW 67.2 (H) 92.6 - 72.8 %   Platelets 88 (L) 150 - 440 K/uL   Neutrophils Relative % 59 %   Neutro Abs 1.5 1.4 - 6.5 K/uL   Lymphocytes Relative 22 %   Lymphs Abs 0.5 (L) 1.0 - 3.6 K/uL   Monocytes Relative 14 %   Monocytes Absolute 0.3 0.2 - 1.0 K/uL   Eosinophils Relative 4 %   Eosinophils Absolute 0.1 0 - 0.7 K/uL   Basophils Relative 1 %   Basophils Absolute 0.0 0 - 0.1 K/uL  Comprehensive metabolic panel  Result Value Ref Range   Sodium 137 135 - 145 mmol/L   Potassium 4.0 3.5 - 5.1 mmol/L   Chloride 105 101 - 111 mmol/L   CO2 23 22 - 32 mmol/L   Glucose, Bld 245 (H) 65 - 99 mg/dL   BUN 23 (H) 6 - 20 mg/dL   Creatinine, Ser 2.05 (H) 0.61 - 1.24 mg/dL   Calcium 9.2 8.9 - 41.0 mg/dL   Total Protein 6.8 6.5 - 8.1 g/dL   Albumin 4.0 3.5 - 5.0 g/dL   AST 24 15 - 41 U/L   ALT 19 17 - 63 U/L   Alkaline Phosphatase 66 38 - 126 U/L   Total Bilirubin 1.0 0.3 - 1.2 mg/dL   GFR calc non Af Amer 51 (L) >60  mL/min   GFR calc Af Amer 59 (L) >60 mL/min   Anion gap 9 5 - 15  Magnesium  Result Value Ref Range   Magnesium 1.8 1.7 - 2.4 mg/dL      Assessment & Plan:   Problem List Items Addressed This Visit    Essential hypertension    Chronic, adequate.       Hairy cell leukemia, in remission ([place])   Multiple myeloma not having achieved remission ([place])    Was on revlimid/decadron, now off this due to worsening pancytopenia. Pending trial Vidaza with bone marrow biopsy prior to commencement.       Myelodysplastic syndrome, low grade (HCC)   Systolic murmur    Recent echo stable.       Type 2 diabetes, controlled, with retinopathy ([place]) - Primary    Chronic. Worse with revlimid/decadron, now improvement noted off decadron. Recent BMP glu was very high however pt endorses better readings at home. Will check A1c and fructosamine today. I will ask him to bring sugar log to next office visit to review.           Follow up plan: Return in about 5 months (around 11/14/2017) for follow up visit.  Ria Bush, MD

## 2017-06-16 NOTE — Patient Instructions (Addendum)
Good to see you today Labs today.  Return as needed or in 4-6 months for follow up visit.

## 2017-06-17 LAB — HEMOGLOBIN A1C: HEMOGLOBIN A1C: 6.7 % — AB (ref 4.6–6.5)

## 2017-06-19 ENCOUNTER — Encounter: Payer: Self-pay | Admitting: Family Medicine

## 2017-06-19 LAB — FRUCTOSAMINE: Fructosamine: 260 umol/L (ref 190–270)

## 2017-06-23 ENCOUNTER — Inpatient Hospital Stay: Payer: Medicare Other | Attending: Hematology and Oncology | Admitting: *Deleted

## 2017-06-23 ENCOUNTER — Other Ambulatory Visit: Payer: Self-pay | Admitting: *Deleted

## 2017-06-23 ENCOUNTER — Inpatient Hospital Stay (HOSPITAL_BASED_OUTPATIENT_CLINIC_OR_DEPARTMENT_OTHER): Payer: Medicare Other | Admitting: Hematology and Oncology

## 2017-06-23 ENCOUNTER — Inpatient Hospital Stay: Payer: Medicare Other

## 2017-06-23 VITALS — BP 174/73 | HR 72 | Temp 97.3°F | Resp 18 | Wt 223.2 lb

## 2017-06-23 DIAGNOSIS — G40909 Epilepsy, unspecified, not intractable, without status epilepticus: Secondary | ICD-10-CM | POA: Insufficient documentation

## 2017-06-23 DIAGNOSIS — R011 Cardiac murmur, unspecified: Secondary | ICD-10-CM

## 2017-06-23 DIAGNOSIS — Z85828 Personal history of other malignant neoplasm of skin: Secondary | ICD-10-CM

## 2017-06-23 DIAGNOSIS — Z8719 Personal history of other diseases of the digestive system: Secondary | ICD-10-CM

## 2017-06-23 DIAGNOSIS — E785 Hyperlipidemia, unspecified: Secondary | ICD-10-CM | POA: Insufficient documentation

## 2017-06-23 DIAGNOSIS — I10 Essential (primary) hypertension: Secondary | ICD-10-CM | POA: Diagnosis not present

## 2017-06-23 DIAGNOSIS — D649 Anemia, unspecified: Secondary | ICD-10-CM | POA: Insufficient documentation

## 2017-06-23 DIAGNOSIS — C9000 Multiple myeloma not having achieved remission: Secondary | ICD-10-CM | POA: Insufficient documentation

## 2017-06-23 DIAGNOSIS — C9141 Hairy cell leukemia, in remission: Secondary | ICD-10-CM | POA: Diagnosis not present

## 2017-06-23 DIAGNOSIS — Z7982 Long term (current) use of aspirin: Secondary | ICD-10-CM | POA: Diagnosis not present

## 2017-06-23 DIAGNOSIS — D46Z Other myelodysplastic syndromes: Secondary | ICD-10-CM | POA: Diagnosis not present

## 2017-06-23 DIAGNOSIS — Z79899 Other long term (current) drug therapy: Secondary | ICD-10-CM | POA: Diagnosis not present

## 2017-06-23 DIAGNOSIS — Z803 Family history of malignant neoplasm of breast: Secondary | ICD-10-CM | POA: Diagnosis not present

## 2017-06-23 DIAGNOSIS — Z8701 Personal history of pneumonia (recurrent): Secondary | ICD-10-CM | POA: Diagnosis not present

## 2017-06-23 DIAGNOSIS — Z8619 Personal history of other infectious and parasitic diseases: Secondary | ICD-10-CM | POA: Diagnosis not present

## 2017-06-23 DIAGNOSIS — D469 Myelodysplastic syndrome, unspecified: Secondary | ICD-10-CM

## 2017-06-23 DIAGNOSIS — K219 Gastro-esophageal reflux disease without esophagitis: Secondary | ICD-10-CM | POA: Insufficient documentation

## 2017-06-23 DIAGNOSIS — N400 Enlarged prostate without lower urinary tract symptoms: Secondary | ICD-10-CM

## 2017-06-23 DIAGNOSIS — E538 Deficiency of other specified B group vitamins: Secondary | ICD-10-CM

## 2017-06-23 DIAGNOSIS — Z86010 Personal history of colonic polyps: Secondary | ICD-10-CM

## 2017-06-23 DIAGNOSIS — E11319 Type 2 diabetes mellitus with unspecified diabetic retinopathy without macular edema: Secondary | ICD-10-CM

## 2017-06-23 DIAGNOSIS — R609 Edema, unspecified: Secondary | ICD-10-CM

## 2017-06-23 DIAGNOSIS — Z95828 Presence of other vascular implants and grafts: Secondary | ICD-10-CM

## 2017-06-23 DIAGNOSIS — Z87891 Personal history of nicotine dependence: Secondary | ICD-10-CM | POA: Diagnosis not present

## 2017-06-23 LAB — CBC WITH DIFFERENTIAL/PLATELET
Basophils Absolute: 0 10*3/uL (ref 0–0.1)
Basophils Relative: 1 %
Eosinophils Absolute: 0.1 10*3/uL (ref 0–0.7)
Eosinophils Relative: 4 %
HCT: 25.2 % — ABNORMAL LOW (ref 40.0–52.0)
Hemoglobin: 8.6 g/dL — ABNORMAL LOW (ref 13.0–18.0)
Lymphocytes Relative: 14 %
Lymphs Abs: 0.5 10*3/uL — ABNORMAL LOW (ref 1.0–3.6)
MCH: 36.5 pg — ABNORMAL HIGH (ref 26.0–34.0)
MCHC: 34.2 g/dL (ref 32.0–36.0)
MCV: 106.7 fL — ABNORMAL HIGH (ref 80.0–100.0)
Monocytes Absolute: 0.2 10*3/uL (ref 0.2–1.0)
Monocytes Relative: 6 %
Neutro Abs: 2.6 10*3/uL (ref 1.4–6.5)
Neutrophils Relative %: 75 %
Platelets: 94 10*3/uL — ABNORMAL LOW (ref 150–440)
RBC: 2.36 MIL/uL — ABNORMAL LOW (ref 4.40–5.90)
RDW: 18.1 % — ABNORMAL HIGH (ref 11.5–14.5)
WBC: 3.4 10*3/uL — ABNORMAL LOW (ref 3.8–10.6)

## 2017-06-23 LAB — COMPREHENSIVE METABOLIC PANEL
ALT: 19 U/L (ref 17–63)
AST: 25 U/L (ref 15–41)
Albumin: 3.8 g/dL (ref 3.5–5.0)
Alkaline Phosphatase: 58 U/L (ref 38–126)
Anion gap: 5 (ref 5–15)
BUN: 29 mg/dL — ABNORMAL HIGH (ref 6–20)
CO2: 23 mmol/L (ref 22–32)
Calcium: 9.1 mg/dL (ref 8.9–10.3)
Chloride: 108 mmol/L (ref 101–111)
Creatinine, Ser: 1.27 mg/dL — ABNORMAL HIGH (ref 0.61–1.24)
GFR calc Af Amer: 60 mL/min — ABNORMAL LOW (ref >60)
GFR calc non Af Amer: 52 mL/min — ABNORMAL LOW (ref >60)
Glucose, Bld: 228 mg/dL — ABNORMAL HIGH (ref 65–99)
Potassium: 4 mmol/L (ref 3.5–5.1)
Sodium: 136 mmol/L (ref 135–145)
Total Bilirubin: 0.7 mg/dL (ref 0.3–1.2)
Total Protein: 7.1 g/dL (ref 6.5–8.1)

## 2017-06-23 LAB — MAGNESIUM: Magnesium: 1.9 mg/dL (ref 1.7–2.4)

## 2017-06-23 MED ORDER — SODIUM CHLORIDE 0.9% FLUSH
10.0000 mL | INTRAVENOUS | Status: AC | PRN
Start: 2017-06-23 — End: 2017-06-23
  Administered 2017-06-23: 10 mL
  Filled 2017-06-23: qty 10

## 2017-06-23 MED ORDER — HEPARIN SOD (PORK) LOCK FLUSH 100 UNIT/ML IV SOLN
500.0000 [IU] | INTRAVENOUS | Status: AC | PRN
  Administered 2017-06-23: 500 [IU]

## 2017-06-23 NOTE — Progress Notes (Signed)
Alex Smith Clinic day:  06/23/2017    Chief Complaint: Alex Smith is a 82 y.o. male with a myelodysplastic marrow, smoldering myeloma, and a history of hairy cell leukemia who is seen for review of interval bone marrow and echocardiogram and discussion regarding direction of therapy.  HPI:  The patient was last seen in the medical oncology clinic on 06/10/2017.  At that time, he felt "ok".  Exam revealed a grade III systolic murmur.  He had bilateral lower extremity edema (L>R).  Counts included a hematocrit 25.5, hemoglobin of 8.8, platelets 75,000, and WBC 2300 with ANC 1400.  Echo revealed and EF of 65-70% with a calcified mitral valve.  Bone marrow was pending.  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  There was no evidence of leukemia, lymphoma, or high grade dysplasia.  Cytogenetics were normal (46, XY).  Symptomatically, patient is feeling "better". Patient has no physical complaints today. Patient has not experienced any B symptoms or interval infections. Patient is eating well, with no demonstrated weight loss.   Patient notes that his wife's condition is worsening. He notes that this is weighing heavily on him.  He states, "she is becoming more and more confused".    Past Medical History:  Diagnosis Date  . Anemia   . B12 deficiency   . Basal cell carcinoma of face   . BPH (benign prostatic hypertrophy)    followed by urology, discharged (Dr. Bernardo Heater)  . CAP (community acquired pneumonia) 02/15/2015  . Colon polyps   . Diverticulosis   . GERD (gastroesophageal reflux disease)   . Hairy cell leukemia (Alex Smith) 2006   recurrent, seizure on rituxan, now on cladribine (Alex Smith)  . History of pneumonia 2000's   "once" (07/07/2012)  . History of shingles   . HLD (hyperlipidemia)   . Hypertension   . Pneumonia   . Shortness of breath dyspnea   . Systolic murmur  78/29/5621  . Type 2 diabetes, controlled, with retinopathy ([place])     Past Surgical History:  Procedure Laterality Date  . BONE MARROW BIOPSY  2016  . CARDIOVASCULAR STRESS TEST  2013   treadmill - no evidence ischemia, EF 61%  . CATARACT EXTRACTION W/ INTRAOCULAR LENS  IMPLANT, BILATERAL  ~ 2010  . COLONOSCOPY  2014   [place] WNL no rpt needed, h/o polyps  . EYE SURGERY Left 06/2012   laser surgery  . SKIN CANCER EXCISION     "all over my face" (07/07/2012)    Family History  Problem Relation Age of Onset  . Dementia Mother   . Heart failure Father 28  . Cancer Sister        breast  . Diabetes Paternal Uncle   . Diabetes Paternal Aunt   . CAD Brother 2       MI  . Stroke Neg Hx     Social History:  reports that he quit smoking about 48 years ago. His smoking use included cigarettes. He has a 50.00 pack-year smoking history. he has never used smokeless tobacco. He reports that he drinks alcohol. He reports that he does not use drugs.  He works at [employer] 20-32 hours/week.  The patient 's wife has had 3 hip operations.  She underwent hip replacement 03/06/2016.  His wife is now in a nursing home.  He states that they either eat out or bring food in.  His best contact number is his  cell: [phone]. Patient has a daughter Alex Smith). He is alone today.  Allergies:  Allergies  Allergen Reactions  . Rituximab Rash    Chest tightness Chest tightness  . Blood-Group Specific Substance Other (See Comments)    Had a post transfusion reaction of red blood cells; NOW REQUIRES WASHED BLOOD CELLS Had a post transfusion reaction of red blood cells; NOW REQUIRES WASHED BLOOD CELLS  . Primaxin [Imipenem] Other (See Comments)    Possible allergy Possible allergy  . Voriconazole Other (See Comments)  . Sulfa Antibiotics Itching and Rash  . Sulfacetamide Sodium Itching and Rash  Possible allergy to Zoloft, allopurinol, and a chemotherapy medication.  He tolerates Dapsone.  Current  Medications: Current Outpatient Medications  Medication Sig Dispense Refill  . ACCU-CHEK AVIVA PLUS test strip USE TO CHECK SUGAR TWICE DAILY 200 each 0  . acyclovir (ZOVIRAX) 400 MG tablet TAKE TWO TABLETS BY MOUTH TWICE DAILY 120 tablet 0  . aspirin EC 81 MG tablet Take 81 mg by mouth daily.    Marland Kitchen HUMULIN N 100 UNIT/ML injection INJECT 39 UNITS SUBCUTANEOUSLY TWICE DAILY BEFORE A MEAL 20 mL 6  . HUMULIN R 100 UNIT/ML injection INJECT 10 UNITS SUBCUTANEOUSLY TWICE DAILY BEFORE MEALS 10 mL 6  . hydrochlorothiazide (HYDRODIURIL) 25 MG tablet Take 1 tablet (25 mg total) by mouth daily. 30 tablet 6  . lisinopril (PRINIVIL,ZESTRIL) 20 MG tablet TAKE 1 TABLET BY MOUTH TWICE DAILY 60 tablet 6  . metFORMIN (GLUCOPHAGE) 1000 MG tablet TAKE ONE TABLET TWICE DAILY 180 tablet 1  . MICROLET LANCETS MISC USE AS DIRECTED 200 each 3  . pravastatin (PRAVACHOL) 20 MG tablet TAKE ONE TABLET BY MOUTH AT BEDTIME 30 tablet 0  . tamsulosin (FLOMAX) 0.4 MG CAPS capsule TAKE 1 CAPSULE BY MOUTH EVERY DAY 30 capsule 1  . vitamin E 400 UNIT capsule Take 200 Units by mouth daily.     Marland Kitchen lidocaine-prilocaine (EMLA) cream Apply 1 application topically as needed. (Patient not taking: Reported on 06/23/2017) 30 g 3  . ranitidine (ZANTAC) 150 MG tablet TAKE 1 TABLET BY MOUTH TWICE DAILY AS NEEDED (Patient not taking: Reported on 06/23/2017) 180 tablet 3  . temazepam (RESTORIL) 7.5 MG capsule Take 7.5 mg by mouth once.     . traZODone (DESYREL) 50 MG tablet TAKE 1/2 TO 1 TABLET BY MOUTH AT [facility] NEEDED FOR SLEEP (Patient not taking: Reported on 06/23/2017) 30 tablet 0   Current Facility-Administered Medications  Medication Dose Route Frequency Provider Last Rate Last Dose  . Tbo-Filgrastim (GRANIX) injection 480 mcg  480 mcg Subcutaneous Once Lequita Asal, MD       Facility-Administered Medications Ordered in Other Visits  Medication Dose Route Frequency Provider Last Rate Last Dose  . heparin lock flush 100 unit/mL   500 Units Intravenous Once Alex Smith, Melissa C, MD      . sodium chloride 0.9 % injection 10 mL  10 mL Intravenous PRN Lequita Asal, MD   10 mL at 03/03/15 0903  . sodium chloride 0.9 % injection 10 mL  10 mL Intracatheter PRN Lequita Asal, MD   10 mL at 03/10/15 1410   Review of Systems:  GENERAL:  Feels "better".  No fevers or sweats.  Weight up 1 pound. PERFORMANCE STATUS (ECOG):  1 HEENT: No visual changes, runny nose, sore throat. Pulmonary:  Shortness of breath on exertion.  No cough.  No hemoptysis. Cardiac:  No chest pain, palpitations, orthopnea, or PND.  Heart murmur. GI:  Appetite  good.  No nausea, vomiting, diarrhea, constipation, melena or hematochezia.  GU:  No urgency, frequency, dysuria, or hematuria.  On Flomax. Musculoskeletal:  No back pain.  No joint pain.  No muscle tenderness. Extremities:  No pain or swelling. Skin:  No rashes or skin changes. Neuro:  No headache, numbness or weakness, balance or coordination issues. Endocrine:  Diabetes.  No thyroid issues, hot flashes or night sweats. Psych:  Wife's health concerning patient.  No mood changes, depression or anxiety.  Pain:  No focal pain. Review of systems:  All other systems reviewed and found to be negative.   Physical Exam: Blood pressure (!) 174/73, pulse 72, temperature (!) 97.3 F (36.3 C), temperature source Tympanic, resp. rate 18, weight 223 lb 3.2 oz (101.2 kg).  GENERAL:  Well developed, well nourished, gentleman sitting comfortably in the exam room in no acute distress. MENTAL STATUS:  Alert and oriented to person, place and time. HEAD:  Pearline Cables white hair.  Normocephalic, atraumatic, face symmetric, no Cushingoid features. EYES:  Glasses. Blue eyes.  Pupils equal round and reactive to light and accomodation.  No conjunctivitis or scleral icterus. ENT:  Oropharynx clear without lesion.  Tongue normal. Mucous membranes moist.  RESPIRATORY:  Clear to auscultation without rales, wheezes or  rhonchi. CARDIOVASCULAR:  Grade III systolic murmur (stable). Regular rate and rhythm without, rub or gallop. ABDOMEN:  Soft, non-tender, with active bowel sounds, and no hepatosplenomegaly.  No masses. SKIN:  No rashes or ulcers. EXTREMITIES: Bilateral lower extremity edema (L>R).  No skin discoloration or tenderness.  No palpable cords. LYMPH NODES: No palpable cervical, supraclavicular, axillary or inguinal adenopathy  NEUROLOGICAL: Unremarkable. PSYCH:  Appropriate.    Clinical Support on 06/23/2017  Component Date Value Ref Range Status  . Sodium 06/23/2017 136  135 - 145 mmol/L Final  . Potassium 06/23/2017 4.0  3.5 - 5.1 mmol/L Final  . Chloride 06/23/2017 108  101 - 111 mmol/L Final  . CO2 06/23/2017 23  22 - 32 mmol/L Final  . Glucose, Bld 06/23/2017 228* 65 - 99 mg/dL Final  . BUN 06/23/2017 29* 6 - 20 mg/dL Final  . Creatinine, Ser 06/23/2017 1.27* 0.61 - 1.24 mg/dL Final  . Calcium 06/23/2017 9.1  8.9 - 10.3 mg/dL Final  . Total Protein 06/23/2017 7.1  6.5 - 8.1 g/dL Final  . Albumin 06/23/2017 3.8  3.5 - 5.0 g/dL Final  . AST 06/23/2017 25  15 - 41 U/L Final  . ALT 06/23/2017 19  17 - 63 U/L Final  . Alkaline Phosphatase 06/23/2017 58  38 - 126 U/L Final  . Total Bilirubin 06/23/2017 0.7  0.3 - 1.2 mg/dL Final  . GFR calc non Af Amer 06/23/2017 52* >60 mL/min Final  . GFR calc Af Amer 06/23/2017 60* >60 mL/min Final   Comment: (NOTE) The eGFR has been calculated using the CKD EPI equation. This calculation has not been validated in all clinical situations. eGFR's persistently <60 mL/min signify possible Chronic Kidney Disease.   . Anion gap 06/23/2017 5  5 - 15 Final  . WBC 06/23/2017 3.4* 3.8 - 10.6 K/uL Final  . RBC 06/23/2017 2.36* 4.40 - 5.90 MIL/uL Final  . Hemoglobin 06/23/2017 8.6* 13.0 - 18.0 g/dL Final  . HCT 06/23/2017 25.2* 40.0 - 52.0 % Final  . MCV 06/23/2017 106.7* 80.0 - 100.0 fL Final  . MCH 06/23/2017 36.5* 26.0 - 34.0 pg Final  . MCHC 06/23/2017  34.2  32.0 - 36.0 g/dL Final  . RDW 06/23/2017  18.1* 11.5 - 14.5 % Final  . Platelets 06/23/2017 94* 150 - 440 K/uL Final  . Neutrophils Relative % 06/23/2017 75  % Final  . Neutro Abs 06/23/2017 2.6  1.4 - 6.5 K/uL Final  . Lymphocytes Relative 06/23/2017 14  % Final  . Lymphs Abs 06/23/2017 0.5* 1.0 - 3.6 K/uL Final  . Monocytes Relative 06/23/2017 6  % Final  . Monocytes Absolute 06/23/2017 0.2  0.2 - 1.0 K/uL Final  . Eosinophils Relative 06/23/2017 4  % Final  . Eosinophils Absolute 06/23/2017 0.1  0 - 0.7 K/uL Final  . Basophils Relative 06/23/2017 1  % Final  . Basophils Absolute 06/23/2017 0.0  0 - 0.1 K/uL Final  . Magnesium 06/23/2017 1.9  1.7 - 2.4 mg/dL Final    Assessment:  SAFI CULOTTA is a 82 y.o. 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  He was diagnosed with hairy cell leukemia in 2006.  He received cladribine 11/06-11/05/2005.  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   Bone marrow aspirate and biopsy on 02/07/2015 revealed a extensive marrow involvement by recurrent hairy cell leukemia (approximately 80% of cells in the core). There was a monoclonal plasma cell infiltrate (approximately 10%), compatible with a plasma cell neoplasm. Marrow was hypercellular (40-50%) with markedly diminished residual trilineage hematopoiesis and mild multilineage dyspoiesis.  Peripheral smear revealed leukopenia (WBC 1900) with 2% atypical lymphoid cells compatible with hairy cell leukemia. There was moderate anemia with anisopoikilocytosis. 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  FISH studies for MDS were negative.  Cytogenetics were normal (46, XY).  He has an unclear allergy history.  He may be allergic to Primaxin, Zoloft, allopurinol, and a chemotherapy medication.  Reaction occurred in 06/2005.  He is allergic to sulfa.  He is not allergic to dapsone.  He requires washed  RBCs.  He is on prophylactic acyclovir.  Dapsone and voriconazole were discontinued.  He has a history of shingles prior to prophylactic acyclovir.  He is 17 months status post cladribine (began 03/03/2015).  His counts plateaued on 07/25/2015 and have subsequently decreased.  Labs on 07/25/2015 revealed a normal B12 (894), folate (60.5), and TSH (2.562).  Ferritin was 50 on 04/26/2016.  Labs on 08/22/2016 revealed a normal B12, folate, and TSH.  He is on oral B12.  B12 was 693 on 05/27/2017.  Bone marrow on 11/03/2015 revealed persistent plasma cell neoplasm, now with mildy atypical monoclonal plasma cells estimated at 20-30%.   There was no morphologic evidence of residual hairy cell leukemia.  There was a minute population (0.03%) with hairy cell immunophenotype detected by flow.  Marrow was normocellular for age with relative erythroid hyperplasia, relative myeloid hypoplasia, mild dyspoiesis, adequate megakaryocytes and no increased blasts.  There was no significant increase in marrow reticulin fibers.  Iron was present.  FISH studies for MDS were negative.  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  Cytogenetics were normal (46, XY).  Bone marrow on 03/13/2016 was normocellular to marginally hypercellular for age (20-40%) with relative erythroid hyperplasia and mild dyserythropoiesis, decreased granulopoiesis and adequate megakaryocytes.  There was a persistent 15-20% plasma cell neoplasm.  There was no overt increase in reticulin fibrosis.  Storage iron was present.  Flow cytometry revealed abnormal/monocytic plasma cell population (3% sample), consistent with a plasma cell neoplastic process.  Cytogenetics were normal (46, XY).  FISH studies for myeloma revealed  a CCND1/IGH translocation, t(11;14) in 53% of nuclei.  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  There was no evidence  of leukemia, lymphoma, or high grade dysplasia.  Cytogenetics were normal (46, XY).  SPEP has been followed: 1.0 on 02/03/2015, 1.4 gm/dL on 07/25/2015, 1.3 gm/dL on 11/17/2015, 1.2 gm/dL on 06/28/2016, 1.0 gm/dL on 09/20/2016, 0.8 gm/dL on 04/29/2017, and 0.7 gm/dl [facility]) on 05/12/2017.    Kappa free light chains have been followed: 47.63 (ratio 3.12) on 02/03/2015, 54.9 (ratio 3.14) on 02/07/2015, 46.23 (ratio 3.68) on 07/25/2015, and 50.5 (ratio 4.82) on 11/17/2015.  Beta2-microglobulin was 2.5 on 11/17/2015.  24 hour urine on 11/21/2015 revealed 234.8 mg protein in 24 hours (no monoclonal protein).  Bone survey on 11/24/2015 revealed no lytic lesions.  Epo level was 329.6 on 03/29/2016.  He began a trial of Procrit.  He received 10,000 units of Procrit weekly (04/26/2016 - 06/14/2016).  Hemoglobin ranged between 8.4 and 8.9 without trend.    He is s/p 1 cycle ofRevlimid (10 mg) and Decadron(06/29/2016 - 07/19/2016). Cycle #1 was complicated by anemia and neutropenia. He has received 4 units of PRBCs(1 unit:07/22/2016 and 2 units: 08/07/2016, 1 unit on 01/14/2017, and 1 unit on 02/27/2017; 1 unit on 04/30/2017). He received 6 days ofGCSF.  He is s/p 5 cycles of Revlimid (5 mg) and Decadron (09/14/2016 - 04/01/2017).  Revlimid was on temporary hold beginning 03/04/2017.  Revlimid was discontinued on 05/27/2017.  He began weekly GCSF on 11/05/2016.  He receives GCSF if his ANC is <= 1500.  He is on aspirin 81 mg a day prophylaxis.  PET scan on 03/14/2016 revealed no evidence of hypermetabolic lymphadenopathy or focal skeletal lesions to suggest marrow or cortical bone involvement.  Echo on 06/06/2017 revealed an EF of 65-70%.  Mitral valve was calcified.  The left atrium was mildly dilated.  Symptomatically, he feels "better".  Exam reveals a grade III systolic murmur.  He has bilateral lower extremity edema (L>R).  Counts include a hematocrit 25.2, hemoglobin of 8.6, platelets 94,000,  and WBC 3400 with ANC 2600.  Plan: 1.  Labs today:  CBC with diff, CMP, Mg. 2.  Review interval bone marrow. Awaiting all of his final pathology results for review. Discuss that preliminary marrow report suggest multiple myeloma rather than the suspected myelodysplastic syndrome. Given the marrow results, Vidaza is not indicated.  Discuss with Dr. [name]. 3.  Discussed anemia. Hemoglobin 8.6 today. Patient notes that he feels "better" and he does not need a transfusion.  4.  Review interval echocardiogram. EF 65-70%. 5.  Hold GCSF today. [place] 2600.  6.  RTC weekly for labs (CBC with diff) +/- GCSF  7.  RTC in 1 month for MD assessment, labs (CBC with diff, CMP, Mg), and +/- GCSF.   Honor Loh, NP  06/23/2017, 3:20 PM   I saw and evaluated the patient, participating in the key portions of the service and reviewing pertinent diagnostic studies and records.  I reviewed the nurse practitioner's note and agree with the findings and the plan.  The assessment and plan were discussed with the patient.  Multiple questions were asked by the patient and answered.   Lequita Asal, MD  06/23/2017, 3:20 PM

## 2017-06-23 NOTE — Progress Notes (Signed)
Here for follow up

## 2017-06-29 ENCOUNTER — Encounter: Payer: Self-pay | Admitting: Hematology and Oncology

## 2017-06-30 ENCOUNTER — Inpatient Hospital Stay: Payer: Medicare Other

## 2017-06-30 ENCOUNTER — Telehealth: Payer: Self-pay

## 2017-06-30 DIAGNOSIS — R609 Edema, unspecified: Secondary | ICD-10-CM | POA: Diagnosis not present

## 2017-06-30 DIAGNOSIS — D649 Anemia, unspecified: Secondary | ICD-10-CM | POA: Diagnosis not present

## 2017-06-30 DIAGNOSIS — E11319 Type 2 diabetes mellitus with unspecified diabetic retinopathy without macular edema: Secondary | ICD-10-CM | POA: Diagnosis not present

## 2017-06-30 DIAGNOSIS — Z86010 Personal history of colonic polyps: Secondary | ICD-10-CM | POA: Diagnosis not present

## 2017-06-30 DIAGNOSIS — Z803 Family history of malignant neoplasm of breast: Secondary | ICD-10-CM | POA: Diagnosis not present

## 2017-06-30 DIAGNOSIS — Z85828 Personal history of other malignant neoplasm of skin: Secondary | ICD-10-CM | POA: Diagnosis not present

## 2017-06-30 DIAGNOSIS — Z8619 Personal history of other infectious and parasitic diseases: Secondary | ICD-10-CM | POA: Diagnosis not present

## 2017-06-30 DIAGNOSIS — R011 Cardiac murmur, unspecified: Secondary | ICD-10-CM | POA: Diagnosis not present

## 2017-06-30 DIAGNOSIS — Z8719 Personal history of other diseases of the digestive system: Secondary | ICD-10-CM | POA: Diagnosis not present

## 2017-06-30 DIAGNOSIS — C9000 Multiple myeloma not having achieved remission: Secondary | ICD-10-CM | POA: Diagnosis not present

## 2017-06-30 DIAGNOSIS — N400 Enlarged prostate without lower urinary tract symptoms: Secondary | ICD-10-CM | POA: Diagnosis not present

## 2017-06-30 DIAGNOSIS — D46Z Other myelodysplastic syndromes: Secondary | ICD-10-CM | POA: Diagnosis not present

## 2017-06-30 DIAGNOSIS — C9141 Hairy cell leukemia, in remission: Secondary | ICD-10-CM | POA: Diagnosis not present

## 2017-06-30 DIAGNOSIS — D469 Myelodysplastic syndrome, unspecified: Secondary | ICD-10-CM

## 2017-06-30 DIAGNOSIS — Z7982 Long term (current) use of aspirin: Secondary | ICD-10-CM | POA: Diagnosis not present

## 2017-06-30 DIAGNOSIS — E785 Hyperlipidemia, unspecified: Secondary | ICD-10-CM | POA: Diagnosis not present

## 2017-06-30 DIAGNOSIS — Z79899 Other long term (current) drug therapy: Secondary | ICD-10-CM | POA: Diagnosis not present

## 2017-06-30 DIAGNOSIS — I10 Essential (primary) hypertension: Secondary | ICD-10-CM | POA: Diagnosis not present

## 2017-06-30 DIAGNOSIS — E538 Deficiency of other specified B group vitamins: Secondary | ICD-10-CM | POA: Diagnosis not present

## 2017-06-30 DIAGNOSIS — Z87891 Personal history of nicotine dependence: Secondary | ICD-10-CM | POA: Diagnosis not present

## 2017-06-30 DIAGNOSIS — K219 Gastro-esophageal reflux disease without esophagitis: Secondary | ICD-10-CM | POA: Diagnosis not present

## 2017-06-30 LAB — CBC WITH DIFFERENTIAL/PLATELET
Basophils Absolute: 0 10*3/uL (ref 0–0.1)
Basophils Relative: 1 %
Eosinophils Absolute: 0.1 10*3/uL (ref 0–0.7)
Eosinophils Relative: 4 %
HCT: 26.7 % — ABNORMAL LOW (ref 40.0–52.0)
Hemoglobin: 9.2 g/dL — ABNORMAL LOW (ref 13.0–18.0)
Lymphocytes Relative: 20 %
Lymphs Abs: 0.5 10*3/uL — ABNORMAL LOW (ref 1.0–3.6)
MCH: 36.9 pg — ABNORMAL HIGH (ref 26.0–34.0)
MCHC: 34.3 g/dL (ref 32.0–36.0)
MCV: 107.5 fL — ABNORMAL HIGH (ref 80.0–100.0)
Monocytes Absolute: 0.3 10*3/uL (ref 0.2–1.0)
Monocytes Relative: 10 %
Neutro Abs: 1.7 10*3/uL (ref 1.4–6.5)
Neutrophils Relative %: 65 %
Platelets: 85 10*3/uL — ABNORMAL LOW (ref 150–440)
RBC: 2.49 MIL/uL — ABNORMAL LOW (ref 4.40–5.90)
RDW: 17.4 % — ABNORMAL HIGH (ref 11.5–14.5)
WBC: 2.7 10*3/uL — ABNORMAL LOW (ref 3.8–10.6)

## 2017-06-30 NOTE — Telephone Encounter (Signed)
Patient is requesting a call to discuss his future plan of care.

## 2017-06-30 NOTE — Telephone Encounter (Signed)
Called patient and reviewed his plan from MD note on 11-5.  He is to rtc weekly for labs and see MD/Lab on 07-21-17. MD will discuss with him at that time.

## 2017-07-01 ENCOUNTER — Encounter (HOSPITAL_COMMUNITY): Payer: Self-pay

## 2017-07-02 LAB — CHROMOSOME ANALYSIS, BONE MARROW

## 2017-07-07 ENCOUNTER — Inpatient Hospital Stay: Payer: Medicare Other

## 2017-07-07 ENCOUNTER — Other Ambulatory Visit: Payer: Self-pay | Admitting: Family Medicine

## 2017-07-07 DIAGNOSIS — E785 Hyperlipidemia, unspecified: Secondary | ICD-10-CM | POA: Diagnosis not present

## 2017-07-07 DIAGNOSIS — Z87891 Personal history of nicotine dependence: Secondary | ICD-10-CM | POA: Diagnosis not present

## 2017-07-07 DIAGNOSIS — E538 Deficiency of other specified B group vitamins: Secondary | ICD-10-CM | POA: Diagnosis not present

## 2017-07-07 DIAGNOSIS — N400 Enlarged prostate without lower urinary tract symptoms: Secondary | ICD-10-CM | POA: Diagnosis not present

## 2017-07-07 DIAGNOSIS — R609 Edema, unspecified: Secondary | ICD-10-CM | POA: Diagnosis not present

## 2017-07-07 DIAGNOSIS — Z79899 Other long term (current) drug therapy: Secondary | ICD-10-CM | POA: Diagnosis not present

## 2017-07-07 DIAGNOSIS — Z803 Family history of malignant neoplasm of breast: Secondary | ICD-10-CM | POA: Diagnosis not present

## 2017-07-07 DIAGNOSIS — I10 Essential (primary) hypertension: Secondary | ICD-10-CM | POA: Diagnosis not present

## 2017-07-07 DIAGNOSIS — Z7982 Long term (current) use of aspirin: Secondary | ICD-10-CM | POA: Diagnosis not present

## 2017-07-07 DIAGNOSIS — K219 Gastro-esophageal reflux disease without esophagitis: Secondary | ICD-10-CM | POA: Diagnosis not present

## 2017-07-07 DIAGNOSIS — C9000 Multiple myeloma not having achieved remission: Secondary | ICD-10-CM | POA: Diagnosis not present

## 2017-07-07 DIAGNOSIS — D649 Anemia, unspecified: Secondary | ICD-10-CM | POA: Diagnosis not present

## 2017-07-07 DIAGNOSIS — R011 Cardiac murmur, unspecified: Secondary | ICD-10-CM | POA: Diagnosis not present

## 2017-07-07 DIAGNOSIS — Z85828 Personal history of other malignant neoplasm of skin: Secondary | ICD-10-CM | POA: Diagnosis not present

## 2017-07-07 DIAGNOSIS — Z86010 Personal history of colonic polyps: Secondary | ICD-10-CM | POA: Diagnosis not present

## 2017-07-07 DIAGNOSIS — E11319 Type 2 diabetes mellitus with unspecified diabetic retinopathy without macular edema: Secondary | ICD-10-CM | POA: Diagnosis not present

## 2017-07-07 DIAGNOSIS — Z8719 Personal history of other diseases of the digestive system: Secondary | ICD-10-CM | POA: Diagnosis not present

## 2017-07-07 DIAGNOSIS — Z8619 Personal history of other infectious and parasitic diseases: Secondary | ICD-10-CM | POA: Diagnosis not present

## 2017-07-07 DIAGNOSIS — D469 Myelodysplastic syndrome, unspecified: Secondary | ICD-10-CM

## 2017-07-07 DIAGNOSIS — C9141 Hairy cell leukemia, in remission: Secondary | ICD-10-CM | POA: Diagnosis not present

## 2017-07-07 DIAGNOSIS — D46Z Other myelodysplastic syndromes: Secondary | ICD-10-CM | POA: Diagnosis not present

## 2017-07-07 LAB — CBC WITH DIFFERENTIAL/PLATELET
Basophils Absolute: 0 10*3/uL (ref 0–0.1)
Basophils Relative: 1 %
Eosinophils Absolute: 0.1 10*3/uL (ref 0–0.7)
Eosinophils Relative: 4 %
HCT: 27.5 % — ABNORMAL LOW (ref 40.0–52.0)
Hemoglobin: 9.4 g/dL — ABNORMAL LOW (ref 13.0–18.0)
Lymphocytes Relative: 18 %
Lymphs Abs: 0.6 10*3/uL — ABNORMAL LOW (ref 1.0–3.6)
MCH: 36.7 pg — ABNORMAL HIGH (ref 26.0–34.0)
MCHC: 34.1 g/dL (ref 32.0–36.0)
MCV: 107.7 fL — ABNORMAL HIGH (ref 80.0–100.0)
Monocytes Absolute: 0.3 10*3/uL (ref 0.2–1.0)
Monocytes Relative: 9 %
Neutro Abs: 2.2 10*3/uL (ref 1.4–6.5)
Neutrophils Relative %: 68 %
Platelets: 89 10*3/uL — ABNORMAL LOW (ref 150–440)
RBC: 2.56 MIL/uL — ABNORMAL LOW (ref 4.40–5.90)
RDW: 17 % — ABNORMAL HIGH (ref 11.5–14.5)
WBC: 3.2 10*3/uL — ABNORMAL LOW (ref 3.8–10.6)

## 2017-07-09 ENCOUNTER — Other Ambulatory Visit: Payer: Self-pay | Admitting: Family Medicine

## 2017-07-09 NOTE — Telephone Encounter (Signed)
Patient will call [facility] to have insulin (out of stock) prescription transferred to secondary pharmacy.

## 2017-07-09 NOTE — Telephone Encounter (Signed)
Copied from [place]. Topic: Quick Communication - See Telephone Encounter >> Jul 09, 2017  2:26 PM Arletha Grippe wrote: CRM for notification. See Telephone encounter for:   07/09/17. Pt needs refill on   HUMULIN N 100 UNIT/ML injection . [facility] and [facility] was not in [facility], he would like [place] to try to get it filled at [facility] in [place].    Cb number is [phone]

## 2017-07-09 NOTE — Telephone Encounter (Signed)
Pharmacy called - they do have the humulin N  And the pt was confused.  Please send [facility] to [facility], pt will be out in couple of days.  cb number is [phone], ask for [facility]

## 2017-07-14 ENCOUNTER — Inpatient Hospital Stay: Payer: Medicare Other

## 2017-07-14 DIAGNOSIS — E11319 Type 2 diabetes mellitus with unspecified diabetic retinopathy without macular edema: Secondary | ICD-10-CM | POA: Diagnosis not present

## 2017-07-14 DIAGNOSIS — D469 Myelodysplastic syndrome, unspecified: Secondary | ICD-10-CM

## 2017-07-14 DIAGNOSIS — E785 Hyperlipidemia, unspecified: Secondary | ICD-10-CM | POA: Diagnosis not present

## 2017-07-14 DIAGNOSIS — Z7982 Long term (current) use of aspirin: Secondary | ICD-10-CM | POA: Diagnosis not present

## 2017-07-14 DIAGNOSIS — N400 Enlarged prostate without lower urinary tract symptoms: Secondary | ICD-10-CM | POA: Diagnosis not present

## 2017-07-14 DIAGNOSIS — R011 Cardiac murmur, unspecified: Secondary | ICD-10-CM | POA: Diagnosis not present

## 2017-07-14 DIAGNOSIS — I10 Essential (primary) hypertension: Secondary | ICD-10-CM | POA: Diagnosis not present

## 2017-07-14 DIAGNOSIS — Z8719 Personal history of other diseases of the digestive system: Secondary | ICD-10-CM | POA: Diagnosis not present

## 2017-07-14 DIAGNOSIS — E538 Deficiency of other specified B group vitamins: Secondary | ICD-10-CM | POA: Diagnosis not present

## 2017-07-14 DIAGNOSIS — Z8619 Personal history of other infectious and parasitic diseases: Secondary | ICD-10-CM | POA: Diagnosis not present

## 2017-07-14 DIAGNOSIS — C9000 Multiple myeloma not having achieved remission: Secondary | ICD-10-CM | POA: Diagnosis not present

## 2017-07-14 DIAGNOSIS — Z79899 Other long term (current) drug therapy: Secondary | ICD-10-CM | POA: Diagnosis not present

## 2017-07-14 DIAGNOSIS — Z85828 Personal history of other malignant neoplasm of skin: Secondary | ICD-10-CM | POA: Diagnosis not present

## 2017-07-14 DIAGNOSIS — Z87891 Personal history of nicotine dependence: Secondary | ICD-10-CM | POA: Diagnosis not present

## 2017-07-14 DIAGNOSIS — Z86010 Personal history of colonic polyps: Secondary | ICD-10-CM | POA: Diagnosis not present

## 2017-07-14 DIAGNOSIS — K219 Gastro-esophageal reflux disease without esophagitis: Secondary | ICD-10-CM | POA: Diagnosis not present

## 2017-07-14 DIAGNOSIS — Z803 Family history of malignant neoplasm of breast: Secondary | ICD-10-CM | POA: Diagnosis not present

## 2017-07-14 DIAGNOSIS — D649 Anemia, unspecified: Secondary | ICD-10-CM | POA: Diagnosis not present

## 2017-07-14 DIAGNOSIS — D46Z Other myelodysplastic syndromes: Secondary | ICD-10-CM | POA: Diagnosis not present

## 2017-07-14 DIAGNOSIS — R609 Edema, unspecified: Secondary | ICD-10-CM | POA: Diagnosis not present

## 2017-07-14 DIAGNOSIS — C9141 Hairy cell leukemia, in remission: Secondary | ICD-10-CM | POA: Diagnosis not present

## 2017-07-14 LAB — CBC WITH DIFFERENTIAL/PLATELET
Basophils Absolute: 0 10*3/uL (ref 0–0.1)
Basophils Relative: 1 %
Eosinophils Absolute: 0.1 10*3/uL (ref 0–0.7)
Eosinophils Relative: 4 %
HCT: 27.2 % — ABNORMAL LOW (ref 40.0–52.0)
Hemoglobin: 9.4 g/dL — ABNORMAL LOW (ref 13.0–18.0)
Lymphocytes Relative: 19 %
Lymphs Abs: 0.6 10*3/uL — ABNORMAL LOW (ref 1.0–3.6)
MCH: 36.8 pg — ABNORMAL HIGH (ref 26.0–34.0)
MCHC: 34.5 g/dL (ref 32.0–36.0)
MCV: 106.7 fL — ABNORMAL HIGH (ref 80.0–100.0)
Monocytes Absolute: 0.1 10*3/uL — ABNORMAL LOW (ref 0.2–1.0)
Monocytes Relative: 5 %
Neutro Abs: 2.3 10*3/uL (ref 1.4–6.5)
Neutrophils Relative %: 71 %
Platelets: 82 10*3/uL — ABNORMAL LOW (ref 150–440)
RBC: 2.55 MIL/uL — ABNORMAL LOW (ref 4.40–5.90)
RDW: 16.2 % — ABNORMAL HIGH (ref 11.5–14.5)
WBC: 3.2 10*3/uL — ABNORMAL LOW (ref 3.8–10.6)

## 2017-07-16 ENCOUNTER — Other Ambulatory Visit: Payer: Self-pay | Admitting: Family Medicine

## 2017-07-21 ENCOUNTER — Inpatient Hospital Stay: Payer: Medicare Other

## 2017-07-21 ENCOUNTER — Inpatient Hospital Stay: Payer: Medicare Other | Admitting: Hematology and Oncology

## 2017-07-21 ENCOUNTER — Inpatient Hospital Stay: Payer: Medicare Other | Attending: Hematology and Oncology

## 2017-07-21 VITALS — BP 149/77 | HR 79 | Temp 99.2°F | Resp 18 | Wt 223.0 lb

## 2017-07-21 DIAGNOSIS — N400 Enlarged prostate without lower urinary tract symptoms: Secondary | ICD-10-CM

## 2017-07-21 DIAGNOSIS — Z7984 Long term (current) use of oral hypoglycemic drugs: Secondary | ICD-10-CM | POA: Diagnosis not present

## 2017-07-21 DIAGNOSIS — Z452 Encounter for adjustment and management of vascular access device: Secondary | ICD-10-CM | POA: Diagnosis not present

## 2017-07-21 DIAGNOSIS — E119 Type 2 diabetes mellitus without complications: Secondary | ICD-10-CM | POA: Insufficient documentation

## 2017-07-21 DIAGNOSIS — K219 Gastro-esophageal reflux disease without esophagitis: Secondary | ICD-10-CM | POA: Diagnosis not present

## 2017-07-21 DIAGNOSIS — C9141 Hairy cell leukemia, in remission: Secondary | ICD-10-CM

## 2017-07-21 DIAGNOSIS — R609 Edema, unspecified: Secondary | ICD-10-CM | POA: Insufficient documentation

## 2017-07-21 DIAGNOSIS — Z87891 Personal history of nicotine dependence: Secondary | ICD-10-CM | POA: Insufficient documentation

## 2017-07-21 DIAGNOSIS — Z803 Family history of malignant neoplasm of breast: Secondary | ICD-10-CM | POA: Insufficient documentation

## 2017-07-21 DIAGNOSIS — I10 Essential (primary) hypertension: Secondary | ICD-10-CM | POA: Insufficient documentation

## 2017-07-21 DIAGNOSIS — Z7982 Long term (current) use of aspirin: Secondary | ICD-10-CM

## 2017-07-21 DIAGNOSIS — Z85828 Personal history of other malignant neoplasm of skin: Secondary | ICD-10-CM

## 2017-07-21 DIAGNOSIS — D4620 Refractory anemia with excess of blasts, unspecified: Secondary | ICD-10-CM

## 2017-07-21 DIAGNOSIS — Z86010 Personal history of colonic polyps: Secondary | ICD-10-CM | POA: Insufficient documentation

## 2017-07-21 DIAGNOSIS — C9000 Multiple myeloma not having achieved remission: Secondary | ICD-10-CM

## 2017-07-21 DIAGNOSIS — Z79899 Other long term (current) drug therapy: Secondary | ICD-10-CM | POA: Insufficient documentation

## 2017-07-21 DIAGNOSIS — E785 Hyperlipidemia, unspecified: Secondary | ICD-10-CM

## 2017-07-21 DIAGNOSIS — R011 Cardiac murmur, unspecified: Secondary | ICD-10-CM | POA: Diagnosis not present

## 2017-07-21 DIAGNOSIS — Z8701 Personal history of pneumonia (recurrent): Secondary | ICD-10-CM | POA: Diagnosis not present

## 2017-07-21 DIAGNOSIS — Z8619 Personal history of other infectious and parasitic diseases: Secondary | ICD-10-CM

## 2017-07-21 DIAGNOSIS — E538 Deficiency of other specified B group vitamins: Secondary | ICD-10-CM

## 2017-07-21 DIAGNOSIS — D649 Anemia, unspecified: Secondary | ICD-10-CM

## 2017-07-21 DIAGNOSIS — Z7189 Other specified counseling: Secondary | ICD-10-CM

## 2017-07-21 DIAGNOSIS — D469 Myelodysplastic syndrome, unspecified: Secondary | ICD-10-CM

## 2017-07-21 LAB — COMPREHENSIVE METABOLIC PANEL
ALT: 16 U/L — ABNORMAL LOW (ref 17–63)
AST: 21 U/L (ref 15–41)
Albumin: 4 g/dL (ref 3.5–5.0)
Alkaline Phosphatase: 57 U/L (ref 38–126)
Anion gap: 8 (ref 5–15)
BUN: 22 mg/dL — ABNORMAL HIGH (ref 6–20)
CO2: 26 mmol/L (ref 22–32)
Calcium: 9.1 mg/dL (ref 8.9–10.3)
Chloride: 103 mmol/L (ref 101–111)
Creatinine, Ser: 1.38 mg/dL — ABNORMAL HIGH (ref 0.61–1.24)
GFR calc Af Amer: 54 mL/min — ABNORMAL LOW (ref >60)
GFR calc non Af Amer: 47 mL/min — ABNORMAL LOW (ref >60)
Glucose, Bld: 142 mg/dL — ABNORMAL HIGH (ref 65–99)
Potassium: 4 mmol/L (ref 3.5–5.1)
Sodium: 137 mmol/L (ref 135–145)
Total Bilirubin: 0.6 mg/dL (ref 0.3–1.2)
Total Protein: 7.5 g/dL (ref 6.5–8.1)

## 2017-07-21 LAB — CBC WITH DIFFERENTIAL/PLATELET
Basophils Absolute: 0 10*3/uL (ref 0–0.1)
Basophils Relative: 1 %
Eosinophils Absolute: 0.1 10*3/uL (ref 0–0.7)
Eosinophils Relative: 3 %
HCT: 27.6 % — ABNORMAL LOW (ref 40.0–52.0)
Hemoglobin: 9.6 g/dL — ABNORMAL LOW (ref 13.0–18.0)
Lymphocytes Relative: 20 %
Lymphs Abs: 0.6 10*3/uL — ABNORMAL LOW (ref 1.0–3.6)
MCH: 36.8 pg — ABNORMAL HIGH (ref 26.0–34.0)
MCHC: 34.7 g/dL (ref 32.0–36.0)
MCV: 106.1 fL — ABNORMAL HIGH (ref 80.0–100.0)
Monocytes Absolute: 0.2 10*3/uL (ref 0.2–1.0)
Monocytes Relative: 8 %
Neutro Abs: 2.1 10*3/uL (ref 1.4–6.5)
Neutrophils Relative %: 68 %
Platelets: 80 10*3/uL — ABNORMAL LOW (ref 150–440)
RBC: 2.6 MIL/uL — ABNORMAL LOW (ref 4.40–5.90)
RDW: 16.1 % — ABNORMAL HIGH (ref 11.5–14.5)
WBC: 3.1 10*3/uL — ABNORMAL LOW (ref 3.8–10.6)

## 2017-07-21 LAB — MAGNESIUM: Magnesium: 1.9 mg/dL (ref 1.7–2.4)

## 2017-07-21 NOTE — Progress Notes (Signed)
Patient states he is experiencing pain in his left hip and down his left leg. Otherwise, no complaints.

## 2017-07-21 NOTE — Progress Notes (Signed)
[facility] day:  07/21/2017    Chief Complaint: Alex Smith is a 82 y.o. male with a myelodysplastic marrow, smoldering myeloma, and a history of hairy cell leukemia who is seen for 1 month assessment.  HPI:  The patient was last seen in the medical oncology clinic on 06/23/2017.  At that time, he felt "better".  Exam revealed a grade III systolic murmur.  He had bilateral lower extremity edema (L>R).  Counts included a hematocrit 25.2, hemoglobin of 8.6, platelets 94,000, and WBC 3400 with ANC 2600. He did not need transfusion.  He did not receive GCSF.  Bone marrow at last visit revealed a variably cellular marrow (10-50%) with kappa-restricted plasmacytosis (10-15%).  There was no evidence of leukemia, lymphoma, or high grade dysplasia. Cytogenetics were normal (46, XY).  He has had counts followed weekly.  Hematocrit has ranged from 26.7 - 27.2, hemoglobin 9.2 - 9.4, platelets 82,000 - 89,000, WBC 2700 - 3200, and ANC 1700 - 2300.  He has not required transfusion or GCSF.  Symptomatically, patient notes that he "doesn't feel so good in the mornings". As the day progresses, patient begins to feel better. Patient denies any physical complaints today. He verbalizes no complaints of pain in clinic today. Patient has not experienced any B symptoms or interval infections. Patient's weight remains stable.    Past Medical History:  Diagnosis Date  . Anemia   . B12 deficiency   . Basal cell carcinoma of face   . BPH (benign prostatic hypertrophy)    followed by urology, discharged (Dr. Bernardo Heater)  . CAP (community acquired pneumonia) 02/15/2015  . Colon polyps   . Diverticulosis   . GERD (gastroesophageal reflux disease)   . Hairy cell leukemia ([place]) 2006   recurrent, seizure on rituxan, now on cladribine ([place])  . History of pneumonia 2000's   "once" (07/07/2012)  . History of shingles   . HLD (hyperlipidemia)   . Hypertension   .  Pneumonia   . Shortness of breath dyspnea   . Systolic murmur 45/40/9811  . Type 2 diabetes, controlled, with retinopathy ([place])     Past Surgical History:  Procedure Laterality Date  . [place] VITRECTOMY WITH 20 GAUGE MVR PORT FOR MACULAR HOLE  07/07/2012   Procedure: 25 GAUGE PARS PLANA VITRECTOMY WITH 20 GAUGE MVR PORT FOR MACULAR HOLE;  Surgeon: Hayden Pedro, MD;  Location: [facility];  Service: Ophthalmology;  Laterality: Left;  . BONE MARROW BIOPSY  2016  . CARDIOVASCULAR STRESS TEST  2013   treadmill - no evidence ischemia, EF 61%  . CATARACT EXTRACTION W/ INTRAOCULAR LENS  IMPLANT, BILATERAL  ~ 2010  . COLONOSCOPY  2014   [place] WNL no rpt needed, h/o polyps  . EYE SURGERY Left 06/2012   laser surgery  . GAS INSERTION  07/07/2012   Procedure: INSERTION OF GAS;  Surgeon: Hayden Pedro, MD;  Location: [facility];  Service: Ophthalmology;  Laterality: Left;  [id number]  . PERIPHERAL VASCULAR CATHETERIZATION N/A 02/23/2015   Procedure: Glori Luis Cath Insertion;  Surgeon: Algernon Huxley, MD;  Location: [facility];  Service: Cardiovascular;  Laterality: N/A;  . SERUM PATCH  07/07/2012   Procedure: SERUM PATCH;  Surgeon: Hayden Pedro, MD;  Location: [facility];  Service: Ophthalmology;  Laterality: Left;  . SKIN CANCER EXCISION     "all over my face" (07/07/2012)    Family History  Problem Relation Age of Onset  .  Dementia Mother   . Heart failure Father 65  . Cancer Sister        breast  . Diabetes Paternal Uncle   . Diabetes Paternal Aunt   . CAD Brother 2       MI  . Stroke Neg Hx     Social History:  reports that he quit smoking about 48 years ago. His smoking use included cigarettes. He has a 50.00 pack-year smoking history. he has never used smokeless tobacco. He reports that he drinks alcohol. He reports that he does not use drugs.  He works at [employer] 20-32 hours/week.  The patient 's wife has had 3 hip operations.  She underwent hip replacement 03/06/2016.  His  wife is in [place].  He states that they either eat out or bring food in.  His best contact number is his cell: [phone]. Patient has a daughter [name]). He is alone today.  Allergies:  Allergies  Allergen Reactions  . Rituximab Rash    Chest tightness Chest tightness  . Blood-Group Specific Substance Other (See Comments)    Had a post transfusion reaction of red blood cells; NOW REQUIRES WASHED BLOOD CELLS Had a post transfusion reaction of red blood cells; NOW REQUIRES WASHED BLOOD CELLS  . Primaxin [Imipenem] Other (See Comments)    Possible allergy Possible allergy  . Voriconazole Other (See Comments)  . Sulfa Antibiotics Itching and Rash  . Sulfacetamide Sodium Itching and Rash  Possible allergy to Zoloft, allopurinol, and a chemotherapy medication.  He tolerates Dapsone.  Current Medications: Current Outpatient Medications  Medication Sig Dispense Refill  . ACCU-CHEK AVIVA PLUS test strip USE TO CHECK SUGAR TWICE DAILY 200 each 0  . acyclovir (ZOVIRAX) 400 MG tablet TAKE TWO TABLETS TWICE DAILY 120 tablet 0  . aspirin EC 81 MG tablet Take 81 mg by mouth daily.    Marland Kitchen HUMULIN N 100 UNIT/ML injection INJECT 39 UNITS SUBCUTANEOUSLY TWICE DAILY BEFORE A MEAL 20 mL 6  . HUMULIN R 100 UNIT/ML injection INJECT 10 UNITS TWICE DAILY BEFORE MEALS 10 mL 5  . hydrochlorothiazide (HYDRODIURIL) 25 MG tablet Take 1 tablet (25 mg total) by mouth daily. 30 tablet 6  . lisinopril (PRINIVIL,ZESTRIL) 20 MG tablet TAKE ONE TABLET BY MOUTH TWICE DAILY 60 tablet 4  . metFORMIN (GLUCOPHAGE) 1000 MG tablet TAKE ONE TABLET TWICE DAILY 180 tablet 1  . MICROLET LANCETS MISC USE AS DIRECTED 200 each 3  . pravastatin (PRAVACHOL) 20 MG tablet TAKE ONE TABLET AT BEDTIME 30 tablet 4  . tamsulosin (FLOMAX) 0.4 MG CAPS capsule TAKE 1 CAPSULE BY MOUTH EVERY DAY 30 capsule 4  . temazepam (RESTORIL) 7.5 MG capsule Take 7.5 mg by mouth once.     . vitamin E 400 UNIT capsule Take 200 Units by mouth daily.     Marland Kitchen  lidocaine-prilocaine (EMLA) cream Apply 1 application topically as needed. (Patient not taking: Reported on 06/23/2017) 30 g 3  . ranitidine (ZANTAC) 150 MG tablet TAKE 1 TABLET BY MOUTH TWICE DAILY AS NEEDED (Patient not taking: Reported on 06/23/2017) 180 tablet 3  . traZODone (DESYREL) 50 MG tablet TAKE 1/2 TO 1 TABLET BY MOUTH AT [place] NEEDED FOR SLEEP (Patient not taking: Reported on 06/23/2017) 30 tablet 0   Current Facility-Administered Medications  Medication Dose Route Frequency Provider Last Rate Last Dose  . Tbo-Filgrastim (GRANIX) injection 480 mcg  480 mcg Subcutaneous Once Lequita Asal, MD       Facility-Administered Medications Ordered in Other  Visits  Medication Dose Route Frequency Provider Last Rate Last Dose  . heparin lock flush 100 unit/mL  500 Units Intravenous Once Cor[place]an, Melissa C, MD      . sodium chloride 0.9 % injection 10 mL  10 mL Intravenous PRN Lequita Asal, MD   10 mL at 03/03/15 0903  . sodium chloride 0.9 % injection 10 mL  10 mL Intracatheter PRN Lequita Asal, MD   10 mL at 03/10/15 1410   Review of Systems:  GENERAL:  Feels "ok".  He "doesn't feel good in the morning".  No fevers or sweats.  Weight stable. PERFORMANCE STATUS (ECOG):  1 HEENT: No visual changes, runny nose, sore throat. Pulmonary:  Shortness of breath on exertion.  No cough.  No hemoptysis. Cardiac:  No chest pain, palpitations, orthopnea, or PND.  Heart murmur. GI:  Appetite good.  No nausea, vomiting, diarrhea, constipation, melena or hematochezia.  GU:  No urgency, frequency, dysuria, or hematuria.  On Flomax. Musculoskeletal:  No back pain.  No joint pain.  No muscle tenderness. Extremities:  No pain or swelling. Skin:  No rashes or skin changes. Neuro:  No headache, numbness or weakness, balance or coordination issues. Endocrine:  Diabetes.  No thyroid issues, hot flashes or night sweats. Psych:  Wife's health concerning patient.  No mood changes, depression  or anxiety.  Pain:  No focal pain. Review of systems:  All other systems reviewed and found to be negative.   Physical Exam: Blood pressure (!) 149/77, pulse 79, temperature 99.2 F (37.3 C), temperature source Tympanic, resp. rate 18, weight 223 lb (101.2 kg).  GENERAL:  Well developed, well nourished, gentleman sitting comfortably in the exam room in no acute distress. MENTAL STATUS:  Alert and oriented to person, place and time. HEAD:  Pearline Cables white hair.  Normocephalic, atraumatic, face symmetric, no Cushingoid features. EYES:  Glasses. Blue eyes.  Pupils equal round and reactive to light and accomodation.  No conjunctivitis or scleral icterus. ENT:  Oropharynx clear without lesion.  Tongue normal. Mucous membranes moist.  RESPIRATORY:  Clear to auscultation without rales, wheezes or rhonchi. CARDIOVASCULAR:  Grade III systolic murmur (stable). Regular rate and rhythm without, rub or gallop. ABDOMEN:  Soft, non-tender, with active bowel sounds, and no hepatosplenomegaly.  No masses. SKIN:  No rashes or ulcers. EXTREMITIES: Bilateral lower extremity edema (L>R).  No skin discoloration or tenderness.  No palpable cords. LYMPH NODES: No palpable cervical, supraclavicular, axillary or inguinal adenopathy  NEUROLOGICAL: Unremarkable. PSYCH:  Appropriate.    Appointment on 07/21/2017  Component Date Value Ref Range Status  . Magnesium 07/21/2017 1.9  1.7 - 2.4 mg/dL Final  . Sodium 07/21/2017 137  135 - 145 mmol/L Final  . Potassium 07/21/2017 4.0  3.5 - 5.1 mmol/L Final  . Chloride 07/21/2017 103  101 - 111 mmol/L Final  . CO2 07/21/2017 26  22 - 32 mmol/L Final  . Glucose, Bld 07/21/2017 142* 65 - 99 mg/dL Final  . BUN 07/21/2017 22* 6 - 20 mg/dL Final  . Creatinine, Ser 07/21/2017 1.38* 0.61 - 1.24 mg/dL Final  . Calcium 07/21/2017 9.1  8.9 - 10.3 mg/dL Final  . Total Protein 07/21/2017 7.5  6.5 - 8.1 g/dL Final  . Albumin 07/21/2017 4.0  3.5 - 5.0 g/dL Final  . AST 07/21/2017 21  15  - 41 U/L Final  . ALT 07/21/2017 16* 17 - 63 U/L Final  . Alkaline Phosphatase 07/21/2017 57  38 - 126 U/L Final  .  Total Bilirubin 07/21/2017 0.6  0.3 - 1.2 mg/dL Final  . GFR calc non Af Amer 07/21/2017 47* >60 mL/min Final  . GFR calc Af Amer 07/21/2017 54* >60 mL/min Final   Comment: (NOTE) The eGFR has been calculated using the CKD EPI equation. This calculation has not been validated in all clinical situations. eGFR's persistently <60 mL/min signify possible Chronic Kidney Disease.   . Anion gap 07/21/2017 8  5 - 15 Final  . WBC 07/21/2017 3.1* 3.8 - 10.6 K/uL Final  . RBC 07/21/2017 2.60* 4.40 - 5.90 MIL/uL Final  . Hemoglobin 07/21/2017 9.6* 13.0 - 18.0 g/dL Final  . HCT 07/21/2017 27.6* 40.0 - 52.0 % Final  . MCV 07/21/2017 106.1* 80.0 - 100.0 fL Final  . MCH 07/21/2017 36.8* 26.0 - 34.0 pg Final  . MCHC 07/21/2017 34.7  32.0 - 36.0 g/dL Final  . RDW 07/21/2017 16.1* 11.5 - 14.5 % Final  . Platelets 07/21/2017 80* 150 - 440 K/uL Final  . Neutrophils Relative % 07/21/2017 68  % Final  . Neutro Abs 07/21/2017 2.1  1.4 - 6.5 K/uL Final  . Lymphocytes Relative 07/21/2017 20  % Final  . Lymphs Abs 07/21/2017 0.6* 1.0 - 3.6 K/uL Final  . Monocytes Relative 07/21/2017 8  % Final  . Monocytes Absolute 07/21/2017 0.2  0.2 - 1.0 K/uL Final  . Eosinophils Relative 07/21/2017 3  % Final  . Eosinophils Absolute 07/21/2017 0.1  0 - 0.7 K/uL Final  . Basophils Relative 07/21/2017 1  % Final  . Basophils Absolute 07/21/2017 0.0  0 - 0.1 K/uL Final    Assessment:  Alex Smith is a 82 y.o. 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  He was diagnosed with hairy cell leukemia in 2006.  He received cladribine 11/06-11/05/2005.  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   Bone marrow aspirate and biopsy on 02/07/2015 revealed a extensive marrow involvement by recurrent hairy cell leukemia (approximately 80% of cells in the core). There was a monoclonal  plasma cell infiltrate (approximately 10%), compatible with a plasma cell neoplasm. Marrow was hypercellular (40-50%) with markedly diminished residual trilineage hematopoiesis and mild multilineage dyspoiesis.  Peripheral smear revealed leukopenia (WBC 1900) with 2% atypical lymphoid cells compatible with hairy cell leukemia. There was moderate anemia with anisopoikilocytosis. 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  FISH studies for MDS were negative.  Cytogenetics were normal (46, XY).  He has an unclear allergy history.  He may be allergic to Primaxin, Zoloft, allopurinol, and a chemotherapy medication.  Reaction occurred in 06/2005.  He is allergic to sulfa.  He is not allergic to dapsone.  He requires washed RBCs.  He is on prophylactic acyclovir.  Dapsone and voriconazole were discontinued.  He has a history of shingles prior to prophylactic acyclovir.  He is 17 months status post cladribine (began 03/03/2015).  His counts plateaued on 07/25/2015 and have subsequently decreased.  Labs on 07/25/2015 revealed a normal B12 (894), folate (60.5), and TSH (2.562).  Ferritin was 50 on 04/26/2016.  Labs on 08/22/2016 revealed a normal B12, folate, and TSH.  He is on oral B12.  B12 was 693 on 05/27/2017.  Bone marrow on 11/03/2015 revealed persistent plasma cell neoplasm, now with mildy atypical monoclonal plasma cells estimated at 20-30%.   There was no morphologic evidence of residual hairy cell leukemia.  There was a minute population (0.03%) with hairy cell immunophenotype  detected by flow.  Marrow was normocellular for age with relative erythroid hyperplasia, relative myeloid hypoplasia, mild dyspoiesis, adequate megakaryocytes and no increased blasts.  There was no significant increase in marrow reticulin fibers.  Iron was present.  FISH studies for MDS were negative.  FISH panel for myeloma revealed CCND1/IGH translocation-  t(11;14)  and loss of MAF/16q.  Cytogenetics were normal (46, XY).  Bone marrow on 03/13/2016 was normocellular to marginally hypercellular for age (20-40%) with relative erythroid hyperplasia and mild dyserythropoiesis, decreased granulopoiesis and adequate megakaryocytes.  There was a persistent 15-20% plasma cell neoplasm.  There was no overt increase in reticulin fibrosis.  Storage iron was present.  Flow cytometry revealed abnormal/monocytic plasma cell population (3% sample), consistent with a plasma cell neoplastic process.  Cytogenetics were normal (46, XY).  FISH studies for myeloma revealed a CCND1/IGH translocation, t(11;14) in 53% of nuclei.  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  There was no evidence of leukemia, lymphoma, or high grade dysplasia.  Cytogenetics were normal (46, XY).  SPEP has been followed: 1.0 on 02/03/2015, 1.4 gm/dL on 07/25/2015, 1.3 gm/dL on 11/17/2015, 1.2 gm/dL on 06/28/2016, 1.0 gm/dL on 09/20/2016, 0.8 gm/dL on 04/29/2017, and 0.7 gm/dl [facility]) on 05/12/2017.    Kappa free light chains have been followed: 47.63 (ratio 3.12) on 02/03/2015, 54.9 (ratio 3.14) on 02/07/2015, 46.23 (ratio 3.68) on 07/25/2015, and 50.5 (ratio 4.82) on 11/17/2015.  Beta2-microglobulin was 2.5 on 11/17/2015.  24 hour urine on 11/21/2015 revealed 234.8 mg protein in 24 hours (no monoclonal protein).  Bone survey on 11/24/2015 revealed no lytic lesions.  Epo level was 329.6 on 03/29/2016.  He began a trial of Procrit.  He received 10,000 units of Procrit weekly (04/26/2016 - 06/14/2016).  Hemoglobin ranged between 8.4 and 8.9 without trend.    He is s/p 1 cycle ofRevlimid (10 mg) and Decadron(06/29/2016 - 07/19/2016). Cycle #1 was complicated by anemia and neutropenia. He has received 4 units of PRBCs(1 unit:07/22/2016 and 2 units: 08/07/2016, 1 unit on 01/14/2017, and 1 unit on 02/27/2017; 1 unit on 04/30/2017).  He received 6 days ofGCSF.  He is s/p 5 cycles of Revlimid (5 mg) and Decadron (09/14/2016 - 04/01/2017).  Revlimid was on temporary hold beginning 03/04/2017.  Revlimid was discontinued on 05/27/2017.  He began weekly GCSF on 11/05/2016.  He receives GCSF if his ANC is <= 1500.  He is on aspirin 81 mg a day prophylaxis.  PET scan on 03/14/2016 revealed no evidence of hypermetabolic lymphadenopathy or focal skeletal lesions to suggest marrow or cortical bone involvement.  Echo on 06/06/2017 revealed an EF of 65-70%.  Mitral valve was calcified.  The left atrium was mildly dilated.  Symptomatically, he feels "ok".  Exam reveals a grade III systolic murmur.  He has bilateral lower extremity edema (L>R).  Counts include a hematocrit 27.6, hemoglobin of 9.6, platelets 80,000, and WBC 3100 with ANC 2100.   Plan: 1.  Labs today:  CBC with diff, CMP, Mg. 2.  Review interval bone marrow. Awaiting all of his final pathology results for review. Discuss that preliminary marrow report suggest multiple myeloma rather than the suspected myelodysplastic syndrome. Given the marrow results, Vidaza is not indicated.  Discuss with Dr. [name]. 3.  Discussed anemia. Hemoglobin 9.6 today. Patient notes that he feels "good" and he does not need a transfusion.  4.  Hold GCSF today. [place] 2100.  5.  RTC every 2 weeks for labs (CBC with  diff).  6.  RTC in 6 weeks for MD assessment, labs (CBC with diff, CMP, Mg).   Honor Loh, NP  07/21/2017, 3:22 PM   I saw and evaluated the patient, participating in the key portions of the service and reviewing pertinent diagnostic studies and records.  I reviewed the nurse practitioner's note and agree with the findings and the plan.  The assessment and plan were discussed with the patient.  Multiple questions were asked by the patient and answered.   Lequita Asal, MD  07/21/2017, 3:22 PM

## 2017-08-03 ENCOUNTER — Encounter: Payer: Self-pay | Admitting: Hematology and Oncology

## 2017-08-04 ENCOUNTER — Inpatient Hospital Stay: Payer: Medicare Other

## 2017-08-04 ENCOUNTER — Other Ambulatory Visit: Payer: Self-pay | Admitting: Family Medicine

## 2017-08-04 DIAGNOSIS — C9000 Multiple myeloma not having achieved remission: Secondary | ICD-10-CM | POA: Diagnosis not present

## 2017-08-04 DIAGNOSIS — R011 Cardiac murmur, unspecified: Secondary | ICD-10-CM | POA: Diagnosis not present

## 2017-08-04 DIAGNOSIS — C9141 Hairy cell leukemia, in remission: Secondary | ICD-10-CM | POA: Diagnosis not present

## 2017-08-04 DIAGNOSIS — Z87891 Personal history of nicotine dependence: Secondary | ICD-10-CM | POA: Diagnosis not present

## 2017-08-04 DIAGNOSIS — D4620 Refractory anemia with excess of blasts, unspecified: Secondary | ICD-10-CM | POA: Diagnosis not present

## 2017-08-04 DIAGNOSIS — Z7982 Long term (current) use of aspirin: Secondary | ICD-10-CM | POA: Diagnosis not present

## 2017-08-04 DIAGNOSIS — D649 Anemia, unspecified: Secondary | ICD-10-CM | POA: Diagnosis not present

## 2017-08-04 DIAGNOSIS — Z8619 Personal history of other infectious and parasitic diseases: Secondary | ICD-10-CM | POA: Diagnosis not present

## 2017-08-04 DIAGNOSIS — Z7984 Long term (current) use of oral hypoglycemic drugs: Secondary | ICD-10-CM | POA: Diagnosis not present

## 2017-08-04 DIAGNOSIS — Z452 Encounter for adjustment and management of vascular access device: Secondary | ICD-10-CM | POA: Diagnosis not present

## 2017-08-04 DIAGNOSIS — R609 Edema, unspecified: Secondary | ICD-10-CM | POA: Diagnosis not present

## 2017-08-04 DIAGNOSIS — Z803 Family history of malignant neoplasm of breast: Secondary | ICD-10-CM | POA: Diagnosis not present

## 2017-08-04 DIAGNOSIS — E785 Hyperlipidemia, unspecified: Secondary | ICD-10-CM | POA: Diagnosis not present

## 2017-08-04 DIAGNOSIS — E119 Type 2 diabetes mellitus without complications: Secondary | ICD-10-CM | POA: Diagnosis not present

## 2017-08-04 DIAGNOSIS — K219 Gastro-esophageal reflux disease without esophagitis: Secondary | ICD-10-CM | POA: Diagnosis not present

## 2017-08-04 DIAGNOSIS — I10 Essential (primary) hypertension: Secondary | ICD-10-CM | POA: Diagnosis not present

## 2017-08-04 DIAGNOSIS — Z85828 Personal history of other malignant neoplasm of skin: Secondary | ICD-10-CM | POA: Diagnosis not present

## 2017-08-04 DIAGNOSIS — Z79899 Other long term (current) drug therapy: Secondary | ICD-10-CM | POA: Diagnosis not present

## 2017-08-04 DIAGNOSIS — N400 Enlarged prostate without lower urinary tract symptoms: Secondary | ICD-10-CM | POA: Diagnosis not present

## 2017-08-04 DIAGNOSIS — E538 Deficiency of other specified B group vitamins: Secondary | ICD-10-CM | POA: Diagnosis not present

## 2017-08-04 DIAGNOSIS — Z86010 Personal history of colonic polyps: Secondary | ICD-10-CM | POA: Diagnosis not present

## 2017-08-04 LAB — CBC WITH DIFFERENTIAL/PLATELET
Basophils Absolute: 0 10*3/uL (ref 0–0.1)
Basophils Relative: 1 %
Eosinophils Absolute: 0.1 10*3/uL (ref 0–0.7)
Eosinophils Relative: 3 %
HCT: 28.2 % — ABNORMAL LOW (ref 40.0–52.0)
Hemoglobin: 9.7 g/dL — ABNORMAL LOW (ref 13.0–18.0)
Lymphocytes Relative: 24 %
Lymphs Abs: 0.8 10*3/uL — ABNORMAL LOW (ref 1.0–3.6)
MCH: 36.8 pg — ABNORMAL HIGH (ref 26.0–34.0)
MCHC: 34.3 g/dL (ref 32.0–36.0)
MCV: 107.2 fL — ABNORMAL HIGH (ref 80.0–100.0)
Monocytes Absolute: 0.2 10*3/uL (ref 0.2–1.0)
Monocytes Relative: 6 %
Neutro Abs: 2.1 10*3/uL (ref 1.4–6.5)
Neutrophils Relative %: 66 %
Platelets: 71 10*3/uL — ABNORMAL LOW (ref 150–440)
RBC: 2.63 MIL/uL — ABNORMAL LOW (ref 4.40–5.90)
RDW: 15 % — ABNORMAL HIGH (ref 11.5–14.5)
WBC: 3.2 10*3/uL — ABNORMAL LOW (ref 3.8–10.6)

## 2017-08-18 ENCOUNTER — Inpatient Hospital Stay: Payer: Medicare Other

## 2017-08-18 DIAGNOSIS — Z85828 Personal history of other malignant neoplasm of skin: Secondary | ICD-10-CM | POA: Diagnosis not present

## 2017-08-18 DIAGNOSIS — Z7984 Long term (current) use of oral hypoglycemic drugs: Secondary | ICD-10-CM | POA: Diagnosis not present

## 2017-08-18 DIAGNOSIS — C9000 Multiple myeloma not having achieved remission: Secondary | ICD-10-CM

## 2017-08-18 DIAGNOSIS — Z452 Encounter for adjustment and management of vascular access device: Secondary | ICD-10-CM | POA: Diagnosis not present

## 2017-08-18 DIAGNOSIS — E785 Hyperlipidemia, unspecified: Secondary | ICD-10-CM | POA: Diagnosis not present

## 2017-08-18 DIAGNOSIS — Z87891 Personal history of nicotine dependence: Secondary | ICD-10-CM | POA: Diagnosis not present

## 2017-08-18 DIAGNOSIS — E119 Type 2 diabetes mellitus without complications: Secondary | ICD-10-CM | POA: Diagnosis not present

## 2017-08-18 DIAGNOSIS — E538 Deficiency of other specified B group vitamins: Secondary | ICD-10-CM | POA: Diagnosis not present

## 2017-08-18 DIAGNOSIS — R011 Cardiac murmur, unspecified: Secondary | ICD-10-CM | POA: Diagnosis not present

## 2017-08-18 DIAGNOSIS — I10 Essential (primary) hypertension: Secondary | ICD-10-CM | POA: Diagnosis not present

## 2017-08-18 DIAGNOSIS — D649 Anemia, unspecified: Secondary | ICD-10-CM | POA: Diagnosis not present

## 2017-08-18 DIAGNOSIS — Z95828 Presence of other vascular implants and grafts: Secondary | ICD-10-CM

## 2017-08-18 DIAGNOSIS — K219 Gastro-esophageal reflux disease without esophagitis: Secondary | ICD-10-CM | POA: Diagnosis not present

## 2017-08-18 DIAGNOSIS — Z803 Family history of malignant neoplasm of breast: Secondary | ICD-10-CM | POA: Diagnosis not present

## 2017-08-18 DIAGNOSIS — D4620 Refractory anemia with excess of blasts, unspecified: Secondary | ICD-10-CM | POA: Diagnosis not present

## 2017-08-18 DIAGNOSIS — Z79899 Other long term (current) drug therapy: Secondary | ICD-10-CM | POA: Diagnosis not present

## 2017-08-18 DIAGNOSIS — Z7982 Long term (current) use of aspirin: Secondary | ICD-10-CM | POA: Diagnosis not present

## 2017-08-18 DIAGNOSIS — N400 Enlarged prostate without lower urinary tract symptoms: Secondary | ICD-10-CM | POA: Diagnosis not present

## 2017-08-18 DIAGNOSIS — Z86010 Personal history of colonic polyps: Secondary | ICD-10-CM | POA: Diagnosis not present

## 2017-08-18 DIAGNOSIS — R609 Edema, unspecified: Secondary | ICD-10-CM | POA: Diagnosis not present

## 2017-08-18 DIAGNOSIS — Z8619 Personal history of other infectious and parasitic diseases: Secondary | ICD-10-CM | POA: Diagnosis not present

## 2017-08-18 DIAGNOSIS — C9141 Hairy cell leukemia, in remission: Secondary | ICD-10-CM | POA: Diagnosis not present

## 2017-08-18 LAB — CBC WITH DIFFERENTIAL/PLATELET
Basophils Absolute: 0 10*3/uL (ref 0–0.1)
Basophils Relative: 1 %
Eosinophils Absolute: 0.1 10*3/uL (ref 0–0.7)
Eosinophils Relative: 2 %
HCT: 26.7 % — ABNORMAL LOW (ref 40.0–52.0)
Hemoglobin: 9.1 g/dL — ABNORMAL LOW (ref 13.0–18.0)
Lymphocytes Relative: 20 %
Lymphs Abs: 0.6 10*3/uL — ABNORMAL LOW (ref 1.0–3.6)
MCH: 37.3 pg — ABNORMAL HIGH (ref 26.0–34.0)
MCHC: 34.2 g/dL (ref 32.0–36.0)
MCV: 109 fL — ABNORMAL HIGH (ref 80.0–100.0)
Monocytes Absolute: 0.2 10*3/uL (ref 0.2–1.0)
Monocytes Relative: 8 %
Neutro Abs: 1.9 10*3/uL (ref 1.4–6.5)
Neutrophils Relative %: 69 %
Platelets: 81 10*3/uL — ABNORMAL LOW (ref 150–440)
RBC: 2.45 MIL/uL — ABNORMAL LOW (ref 4.40–5.90)
RDW: 15.3 % — ABNORMAL HIGH (ref 11.5–14.5)
WBC: 2.8 10*3/uL — ABNORMAL LOW (ref 3.8–10.6)

## 2017-08-18 MED ORDER — SODIUM CHLORIDE 0.9% FLUSH
10.0000 mL | INTRAVENOUS | Status: AC | PRN
  Administered 2017-08-18: 10 mL
  Filled 2017-08-18: qty 10

## 2017-08-18 MED ORDER — HEPARIN SOD (PORK) LOCK FLUSH 100 UNIT/ML IV SOLN
500.0000 [IU] | INTRAVENOUS | Status: AC | PRN
  Administered 2017-08-18: 500 [IU]

## 2017-08-21 ENCOUNTER — Encounter: Payer: Self-pay | Admitting: Hematology and Oncology

## 2017-08-25 ENCOUNTER — Other Ambulatory Visit: Payer: Self-pay | Admitting: Family Medicine

## 2017-08-28 ENCOUNTER — Telehealth: Payer: Self-pay | Admitting: Family Medicine

## 2017-08-28 NOTE — Telephone Encounter (Signed)
Copied from [place] [id number]. Topic: Quick Communication - Rx Refill/Question >> Aug 28, 2017  2:28 PM Aurelio Brash B wrote:  PT asked for insulin  Refill  But wants to change the insulin if he can  Due to cost   Medication: HUMULIN N 100 UNIT/ML injection                        And   HUMULIN R 100 UNIT/ML injection     Has the patient contacted their pharmacy? Yes  PT is out of    (Agent: If no, request that the patient contact the pharmacy for the refill.)   Preferred Pharmacy (with phone number or street name): [place], [place] - [place] [phone] (Phone) [phone] (Fax)     Agent: Please be advised that RX refills may take up to 3 business days. We ask that you follow-up with your pharmacy.

## 2017-08-29 ENCOUNTER — Telehealth: Payer: Self-pay | Admitting: Family Medicine

## 2017-08-29 ENCOUNTER — Telehealth: Payer: Self-pay

## 2017-08-29 MED ORDER — INSULIN REGULAR HUMAN 100 UNIT/ML IJ SOLN: 10.0000 [IU] | Freq: Two times a day (BID) | INTRAMUSCULAR | 5 refills | Status: DC

## 2017-08-29 NOTE — Telephone Encounter (Signed)
I think this has been taken care of.

## 2017-08-29 NOTE — Telephone Encounter (Signed)
Copied from [place] [id number]. Topic: Quick Communication - Rx Refill/Question >> Aug 28, 2017  2:28 PM Aurelio Brash B wrote: Medication: HUMULIN N 100 UNIT/ML injection                        And   HUMULIN R 100 UNIT/ML injection     Has the patient contacted their pharmacy? Yes  PT is out of    (Agent: If no, request that the patient contact the pharmacy for the refill.)   Preferred Pharmacy (with phone number or street name): [place], [place] - [place] [phone] (Phone) [phone] (Fax)     Agent: Please be advised that RX refills may take up to 3 business days. We ask that you follow-up with your pharmacy. >> Aug 28, 2017  2:35 PM Aurelio Brash B wrote: Pt wants to change insulin if he can due to cost, he oginally said he was out then  [place] he has enough for a couple days >> Aug 29, 2017  1:17 PM Oliver Pila B wrote: Pharmacy called and stated the current directions on the Rx reads for 10units 2x a day; the current rate of the dosage would last the pt 50 days the insurance copay is charging for every 30 days so for on vile the copay is $90; to help the pt the pharmacist is trying to have in parenthesis with the same directions for use to have 33units per day which would change the day supply to 30 and lower the copay for the pt, contact pharmacy if needed

## 2017-08-29 NOTE — Telephone Encounter (Signed)
Copied from [place] [id number]. Topic: Quick Communication - Rx Refill/Question >> Aug 28, 2017  2:28 PM Aurelio Brash B wrote: Medication: HUMULIN N 100 UNIT/ML injection                        And   HUMULIN R 100 UNIT/ML injection     Has the patient contacted their pharmacy? Yes  PT is out of    (Agent: If no, request that the patient contact the pharmacy for the refill.)   Preferred Pharmacy (with phone number or street name): [place], [place] - [place] [phone] (Phone) [phone] (Fax)     Agent: Please be advised that RX refills may take up to 3 business days. We ask that you follow-up with your pharmacy. >> Aug 28, 2017  2:35 PM Aurelio Brash B wrote: Pt wants to change insulin if he can due to cost, he oginally said he was out then  [place] he has enough for a couple days

## 2017-08-29 NOTE — Telephone Encounter (Signed)
Copied from [place] [id number]. Topic: Quick Communication - Rx Refill/Question >> Aug 28, 2017  2:28 PM Aurelio Brash B wrote: Medication: HUMULIN N 100 UNIT/ML injection                        And   HUMULIN R 100 UNIT/ML injection     Has the patient contacted their pharmacy? Yes  PT is out of    (Agent: If no, request that the patient contact the pharmacy for the refill.)   Preferred Pharmacy (with phone number or street name): [place], [place] - [place] [phone] (Phone) [phone] (Fax)     Agent: Please be advised that RX refills may take up to 3 business days. We ask that you follow-up with your pharmacy. >> Aug 28, 2017  2:35 PM Aurelio Brash B wrote: Pt wants to change insulin if he can due to cost, he oginally said he was out then  [place] he has enough for a couple days >> Aug 29, 2017  1:17 PM Oliver Pila B wrote: Pharmacy called and stated the current directions on the Rx reads for 10units 2x a day; the current rate of the dosage would last the pt 50 days the insurance copay is charging for every 30 days so for on vile the copay is $90; to help the pt the pharmacist is trying to have in parenthesis with the same directions for use to have 33units per day which would change the day supply to 30 and lower the copay for the pt, contact pharmacy if needed

## 2017-08-29 NOTE — Telephone Encounter (Signed)
I really don't understand what he's asking for. I think I've sent what the pharmacist is asking. plz touch base with pharmacist that I sent it in right.

## 2017-08-29 NOTE — Telephone Encounter (Signed)
Pt called to check status, pt was notified to call the pharmacy and see what alternatives they have, pt will ask pharmacist to send request on what the cheaper alternative is so that it can be filled; contact pt or pharmacy if needed

## 2017-08-29 NOTE — Telephone Encounter (Signed)
plz have him check with pharmacy to see what insulin alternatives are cheaper - is novolin R and N cheaper? To let us know and I will send this in.

## 2017-08-29 NOTE — Telephone Encounter (Signed)
Spoke with [facility] about rx.  Told someone had already called back and it has been taken care of.

## 2017-09-01 ENCOUNTER — Inpatient Hospital Stay (HOSPITAL_BASED_OUTPATIENT_CLINIC_OR_DEPARTMENT_OTHER): Payer: Medicare Other | Admitting: Hematology and Oncology

## 2017-09-01 ENCOUNTER — Other Ambulatory Visit: Payer: Self-pay

## 2017-09-01 ENCOUNTER — Other Ambulatory Visit: Payer: Self-pay | Admitting: Hematology and Oncology

## 2017-09-01 ENCOUNTER — Inpatient Hospital Stay: Payer: Medicare Other | Attending: Hematology and Oncology

## 2017-09-01 ENCOUNTER — Encounter: Payer: Self-pay | Admitting: Hematology and Oncology

## 2017-09-01 VITALS — BP 148/74 | HR 82 | Temp 98.3°F | Wt 222.3 lb

## 2017-09-01 DIAGNOSIS — C9000 Multiple myeloma not having achieved remission: Secondary | ICD-10-CM

## 2017-09-01 DIAGNOSIS — R5383 Other fatigue: Secondary | ICD-10-CM

## 2017-09-01 DIAGNOSIS — D72822 Plasmacytosis: Secondary | ICD-10-CM | POA: Insufficient documentation

## 2017-09-01 DIAGNOSIS — Z7189 Other specified counseling: Secondary | ICD-10-CM

## 2017-09-01 DIAGNOSIS — C9140 Hairy cell leukemia not having achieved remission: Secondary | ICD-10-CM

## 2017-09-01 DIAGNOSIS — D649 Anemia, unspecified: Secondary | ICD-10-CM | POA: Diagnosis not present

## 2017-09-01 DIAGNOSIS — D4620 Refractory anemia with excess of blasts, unspecified: Secondary | ICD-10-CM

## 2017-09-01 DIAGNOSIS — C9141 Hairy cell leukemia, in remission: Secondary | ICD-10-CM

## 2017-09-01 LAB — SAMPLE TO BLOOD BANK

## 2017-09-01 LAB — COMPREHENSIVE METABOLIC PANEL
ALT: 18 U/L (ref 17–63)
AST: 22 U/L (ref 15–41)
Albumin: 3.9 g/dL (ref 3.5–5.0)
Alkaline Phosphatase: 56 U/L (ref 38–126)
Anion gap: 8 (ref 5–15)
BUN: 31 mg/dL — ABNORMAL HIGH (ref 6–20)
CO2: 23 mmol/L (ref 22–32)
Calcium: 9 mg/dL (ref 8.9–10.3)
Chloride: 104 mmol/L (ref 101–111)
Creatinine, Ser: 1.37 mg/dL — ABNORMAL HIGH (ref 0.61–1.24)
GFR calc Af Amer: 55 mL/min — ABNORMAL LOW (ref >60)
GFR calc non Af Amer: 47 mL/min — ABNORMAL LOW (ref >60)
Glucose, Bld: 276 mg/dL — ABNORMAL HIGH (ref 65–99)
Potassium: 4.3 mmol/L (ref 3.5–5.1)
Sodium: 135 mmol/L (ref 135–145)
Total Bilirubin: 0.7 mg/dL (ref 0.3–1.2)
Total Protein: 7.2 g/dL (ref 6.5–8.1)

## 2017-09-01 LAB — CBC WITH DIFFERENTIAL/PLATELET
Basophils Absolute: 0 10*3/uL (ref 0–0.1)
Basophils Relative: 1 %
Eosinophils Absolute: 0.1 10*3/uL (ref 0–0.7)
Eosinophils Relative: 3 %
HCT: 26.5 % — ABNORMAL LOW (ref 40.0–52.0)
Hemoglobin: 9.1 g/dL — ABNORMAL LOW (ref 13.0–18.0)
Lymphocytes Relative: 18 %
Lymphs Abs: 0.5 10*3/uL — ABNORMAL LOW (ref 1.0–3.6)
MCH: 37.4 pg — ABNORMAL HIGH (ref 26.0–34.0)
MCHC: 34.4 g/dL (ref 32.0–36.0)
MCV: 108.6 fL — ABNORMAL HIGH (ref 80.0–100.0)
Monocytes Absolute: 0.2 10*3/uL (ref 0.2–1.0)
Monocytes Relative: 7 %
Neutro Abs: 1.8 10*3/uL (ref 1.4–6.5)
Neutrophils Relative %: 71 %
Platelets: 78 10*3/uL — ABNORMAL LOW (ref 150–440)
RBC: 2.44 MIL/uL — ABNORMAL LOW (ref 4.40–5.90)
RDW: 15.3 % — ABNORMAL HIGH (ref 11.5–14.5)
WBC: 2.5 10*3/uL — ABNORMAL LOW (ref 3.8–10.6)

## 2017-09-01 NOTE — Progress Notes (Signed)
[facility] day:  09/01/2017    Chief Complaint: Alex Smith is a 83 y.o. male with a myelodysplastic marrow, smoldering myeloma, and a history of hairy cell leukemia who is seen for 1 month assessment.  HPI:  The patient was last seen in the medical oncology clinic on 07/21/2017.  At that time, he felt "ok".  Exam revealed a grade III systolic murmur.  He had bilateral lower extremity edema (L>R).  Counts included a hematocrit 27.6, hemoglobin of 9.6, platelets 80,000, and WBC 3100 with ANC 2100.  Decision was made for ongoing observation given his stable counts.  Preliminary bone marrow at last visit revealed a variably cellular marrow (10-50%) with kappa-restricted plasmacytosis (10-15%).  There was no evidence of leukemia, lymphoma, or high grade dysplasia. Cytogenetics were normal (46, XY).  CBC on 08/18/2017 revealed a hematocrit of 26.7, hemoglobin 9.1, MCV 109, platelets 81,000, WBC 2800 with an ANC of 1900.  During the interim, patient has been doing "fair".  Patient has fatigue. He notes that he is worn out dealing with his health and the health of his wife. Patient verbalizes no acute concerns today. He denies any B symptoms or interval infections.  Patient is eating well. His weight remains stable. Patient denies pain in the clinic today.    Past Medical History:  Diagnosis Date  . Anemia   . B12 deficiency   . Basal cell carcinoma of face   . BPH (benign prostatic hypertrophy)    followed by urology, discharged (Dr. Bernardo Smith)  . CAP (community acquired pneumonia) 02/15/2015  . Colon polyps   . Diverticulosis   . GERD (gastroesophageal reflux disease)   . Hairy cell leukemia (Alex Smith) 2006   recurrent, seizure on rituxan, now on cladribine (Alex Smith)  . History of pneumonia 2000's   "once" (07/07/2012)  . History of shingles   . HLD (hyperlipidemia)   . Hypertension   . Pneumonia   . Shortness of breath dyspnea   . Systolic  murmur 55/73/2202  . Type 2 diabetes, controlled, with retinopathy (Alex Smith)     Past Surgical History:  Procedure Laterality Date  . [place] VITRECTOMY WITH 20 GAUGE MVR PORT FOR MACULAR HOLE  07/07/2012   Procedure: 25 GAUGE PARS PLANA VITRECTOMY WITH 20 GAUGE MVR PORT FOR MACULAR HOLE;  Surgeon: Alex Pedro, MD;  Location: [facility];  Service: Ophthalmology;  Laterality: Left;  . BONE MARROW BIOPSY  2016  . CARDIOVASCULAR STRESS TEST  2013   treadmill - no evidence ischemia, EF 61%  . CATARACT EXTRACTION W/ INTRAOCULAR LENS  IMPLANT, BILATERAL  ~ 2010  . COLONOSCOPY  2014   Alex Smith WNL no rpt needed, h/o polyps  . EYE SURGERY Left 06/2012   laser surgery  . GAS INSERTION  07/07/2012   Procedure: INSERTION OF GAS;  Surgeon: Alex Pedro, MD;  Location: [facility];  Service: Ophthalmology;  Laterality: Left;  [id number]  . PERIPHERAL VASCULAR CATHETERIZATION N/A 02/23/2015   Procedure: Glori Luis Cath Insertion;  Surgeon: Algernon Huxley, MD;  Location: [facility];  Service: Cardiovascular;  Laterality: N/A;  . SERUM PATCH  07/07/2012   Procedure: SERUM PATCH;  Surgeon: Alex Pedro, MD;  Location: [facility];  Service: Ophthalmology;  Laterality: Left;  . SKIN CANCER EXCISION     "all over my face" (07/07/2012)    Family History  Problem Relation Age of Onset  . Dementia Mother   . Heart  failure Father 48  . Cancer Sister        breast  . Diabetes Paternal Uncle   . Diabetes Paternal Aunt   . CAD Brother 42       MI  . Stroke Neg Hx     Social History:  reports that he quit smoking about 48 years ago. His smoking use included cigarettes. He has a 50.00 pack-year smoking history. he has never used smokeless tobacco. He reports that he drinks alcohol. He reports that he does not use drugs.  He works at [employer] 20-32 hours/week.  The patient 's wife has had 3 hip operations.  She underwent hip replacement 03/06/2016.  His wife is in [place].  He states that they either eat out  or bring food in.  His best contact number is his cell: [phone]. Patient has a daughter Alex Smith). He is alone today.  Allergies:  Allergies  Allergen Reactions  . Rituximab Rash    Chest tightness Chest tightness  . Blood-Group Specific Substance Other (See Comments)    Had a post transfusion reaction of red blood cells; NOW REQUIRES WASHED BLOOD CELLS Had a post transfusion reaction of red blood cells; NOW REQUIRES WASHED BLOOD CELLS  . Primaxin [Imipenem] Other (See Comments)    Possible allergy Possible allergy  . Voriconazole Other (See Comments)  . Sulfa Antibiotics Itching and Rash  . Sulfacetamide Sodium Itching and Rash  Possible allergy to Zoloft, allopurinol, and a chemotherapy medication.  He tolerates Dapsone.  Current Medications: Current Outpatient Medications  Medication Sig Dispense Refill  . ACCU-CHEK AVIVA PLUS test strip USE TO CHECK SUGAR TWICE DAILY 200 each 0  . acyclovir (ZOVIRAX) 400 MG tablet TAKE TWO TABLETS TWICE DAILY 120 tablet 0  . aspirin EC 81 MG tablet Take 81 mg by mouth daily.    Marland Kitchen HUMULIN N 100 UNIT/ML injection INJECT 39 UNITS SUBCUTANEOUSLY TWICE DAILY BEFORE A MEAL 20 mL 6  . hydrochlorothiazide (HYDRODIURIL) 25 MG tablet Take 1 tablet (25 mg total) by mouth daily. 30 tablet 6  . insulin regular (HUMULIN R) 100 units/mL injection Inject 0.1 mLs (10 Units total) into the skin 2 (two) times daily before a meal. (33 units daily) 10 mL 5  . lidocaine-prilocaine (EMLA) cream Apply 1 application topically as needed. 30 g 3  . lisinopril (PRINIVIL,ZESTRIL) 20 MG tablet TAKE ONE TABLET BY MOUTH TWICE DAILY 60 tablet 4  . metFORMIN (GLUCOPHAGE) 1000 MG tablet TAKE ONE TABLET TWICE DAILY 180 tablet 1  . MICROLET LANCETS MISC USE AS DIRECTED 200 each 3  . pravastatin (PRAVACHOL) 20 MG tablet TAKE ONE TABLET AT BEDTIME 30 tablet 4  . ranitidine (ZANTAC) 150 MG tablet TAKE ONE TABLET BY MOUTH TWICE DAILY AS NEEDED 180 tablet 3  . tamsulosin (FLOMAX) 0.4  MG CAPS capsule TAKE 1 CAPSULE BY MOUTH EVERY DAY 30 capsule 4  . temazepam (RESTORIL) 7.5 MG capsule Take 7.5 mg by mouth once.     . traZODone (DESYREL) 50 MG tablet TAKE 1/2 TO 1 TABLET BY MOUTH AT BEDTIMEAS NEEDED FOR SLEEP 30 tablet 0  . vitamin E 400 UNIT capsule Take 200 Units by mouth daily.      Current Facility-Administered Medications  Medication Dose Route Frequency Provider Last Rate Last Dose  . Tbo-Filgrastim (GRANIX) injection 480 mcg  480 mcg Subcutaneous Once Lequita Asal, MD       Facility-Administered Medications Ordered in Other Visits  Medication Dose Route Frequency Provider Last Rate Last  Dose  . heparin lock flush 100 unit/mL  500 Units Intravenous Once Alex Smith, Melissa C, MD      . sodium chloride 0.9 % injection 10 mL  10 mL Intravenous PRN Lequita Asal, MD   10 mL at 03/03/15 0903  . sodium chloride 0.9 % injection 10 mL  10 mL Intracatheter PRN Lequita Asal, MD   10 mL at 03/10/15 1410   Review of Systems:  GENERAL:  Feels "fair".  Some fatigue (see HPI).  No fevers or sweats.  Weight down 1 pound.  PERFORMANCE STATUS (ECOG):  1 HEENT: No visual changes, runny nose, sore throat. Pulmonary:  Shortness of breath on exertion.  No cough.  No hemoptysis. Cardiac:  No chest pain, palpitations, orthopnea, or PND.  Heart murmur. GI:  Appetite good.  No nausea, vomiting, diarrhea, constipation, melena or hematochezia.  GU:  No urgency, frequency, dysuria, or hematuria.  On Flomax. Musculoskeletal:  No back pain.  No joint pain.  No muscle tenderness. Extremities:  No pain or swelling. Skin:  No rashes or skin changes. Neuro:  No headache, numbness or weakness, balance or coordination issues. Endocrine:  Diabetes.  No thyroid issues, hot flashes or night sweats. Psych:  Wife's health concerning patient.  No mood changes, depression or anxiety.  Pain:  No focal pain. Review of systems:  All other systems reviewed and found to be negative.    Physical Exam: Blood pressure (!) 148/74, pulse 82, temperature 98.3 F (36.8 C), temperature source Tympanic, weight 222 lb 4.8 oz (100.8 kg).  GENERAL:  Well developed, well nourished, gentleman sitting comfortably in the exam room in no acute distress. MENTAL STATUS:  Alert and oriented to person, place and time. HEAD:  Pearline Cables white hair.  Normocephalic, atraumatic, face symmetric, no Cushingoid features. EYES:  Glasses. Blue eyes.  Pupils equal round and reactive to light and accomodation.  No conjunctivitis or scleral icterus. ENT:  Oropharynx clear without lesion.  Tongue normal. Mucous membranes moist.  RESPIRATORY:  Clear to auscultation without rales, wheezes or rhonchi. CARDIOVASCULAR:  Grade III systolic murmur (stable). Regular rate and rhythm without, rub or gallop. ABDOMEN:  Soft, non-tender, with active bowel sounds, and no hepatosplenomegaly.  No masses. SKIN:  No rashes or ulcers. EXTREMITIES: Bilateral lower extremity edema (L>R).  No skin discoloration or tenderness.  No palpable cords. LYMPH NODES: No palpable cervical, supraclavicular, axillary or inguinal adenopathy  NEUROLOGICAL: Unremarkable. PSYCH:  Appropriate.    Appointment on 09/01/2017  Component Date Value Ref Range Status  . Sodium 09/01/2017 135  135 - 145 mmol/L Final  . Potassium 09/01/2017 4.3  3.5 - 5.1 mmol/L Final  . Chloride 09/01/2017 104  101 - 111 mmol/L Final  . CO2 09/01/2017 23  22 - 32 mmol/L Final  . Glucose, Bld 09/01/2017 276* 65 - 99 mg/dL Final  . BUN 09/01/2017 31* 6 - 20 mg/dL Final  . Creatinine, Ser 09/01/2017 1.37* 0.61 - 1.24 mg/dL Final  . Calcium 09/01/2017 9.0  8.9 - 10.3 mg/dL Final  . Total Protein 09/01/2017 7.2  6.5 - 8.1 g/dL Final  . Albumin 09/01/2017 3.9  3.5 - 5.0 g/dL Final  . AST 09/01/2017 22  15 - 41 U/L Final  . ALT 09/01/2017 18  17 - 63 U/L Final  . Alkaline Phosphatase 09/01/2017 56  38 - 126 U/L Final  . Total Bilirubin 09/01/2017 0.7  0.3 - 1.2 mg/dL  Final  . GFR calc non Af Amer 09/01/2017 47* >60  mL/min Final  . GFR calc Af Amer 09/01/2017 55* >60 mL/min Final   Comment: (NOTE) The eGFR has been calculated using the CKD EPI equation. This calculation has not been validated in all clinical situations. eGFR's persistently <60 mL/min signify possible Chronic Kidney Disease.   Georgiann Hahn gap 09/01/2017 8  5 - 15 Final   Performed at [facility], [address] [place], [place] [address]  . WBC 09/01/2017 2.5* 3.8 - 10.6 K/uL Final  . RBC 09/01/2017 2.44* 4.40 - 5.90 MIL/uL Final  . Hemoglobin 09/01/2017 9.1* 13.0 - 18.0 g/dL Final  . HCT 09/01/2017 26.5* 40.0 - 52.0 % Final  . MCV 09/01/2017 108.6* 80.0 - 100.0 fL Final  . MCH 09/01/2017 37.4* 26.0 - 34.0 pg Final  . MCHC 09/01/2017 34.4  32.0 - 36.0 g/dL Final  . RDW 09/01/2017 15.3* 11.5 - 14.5 % Final  . Platelets 09/01/2017 78* 150 - 440 K/uL Final  . Neutrophils Relative % 09/01/2017 71  % Final  . Neutro Abs 09/01/2017 1.8  1.4 - 6.5 K/uL Final  . Lymphocytes Relative 09/01/2017 18  % Final  . Lymphs Abs 09/01/2017 0.5* 1.0 - 3.6 K/uL Final  . Monocytes Relative 09/01/2017 7  % Final  . Monocytes Absolute 09/01/2017 0.2  0.2 - 1.0 K/uL Final  . Eosinophils Relative 09/01/2017 3  % Final  . Eosinophils Absolute 09/01/2017 0.1  0 - 0.7 K/uL Final  . Basophils Relative 09/01/2017 1  % Final  . Basophils Absolute 09/01/2017 0.0  0 - 0.1 K/uL Final   Performed at [facility], [address] [place], [place] [address]    Assessment:  Alex Smith is a 83 y.o. 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  He was diagnosed with hairy cell leukemia in 2006.  He received cladribine 11/06-11/05/2005.  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   Bone marrow aspirate and biopsy on 02/07/2015 revealed a extensive marrow involvement by recurrent hairy cell leukemia (approximately 80% of cells in the core). There was a monoclonal plasma  cell infiltrate (approximately 10%), compatible with a plasma cell neoplasm. Marrow was hypercellular (40-50%) with markedly diminished residual trilineage hematopoiesis and mild multilineage dyspoiesis.  Peripheral smear revealed leukopenia (WBC 1900) with 2% atypical lymphoid cells compatible with hairy cell leukemia. There was moderate anemia with anisopoikilocytosis. 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  FISH studies for MDS were negative.  Cytogenetics were normal (46, XY).  He has an unclear allergy history.  He may be allergic to Primaxin, Zoloft, allopurinol, and a chemotherapy medication.  Reaction occurred in 06/2005.  He is allergic to sulfa.  He is not allergic to dapsone.  He requires washed RBCs.  He is on prophylactic acyclovir.  Dapsone and voriconazole were discontinued.  He has a history of shingles prior to prophylactic acyclovir.  He is 17 months status post cladribine (began 03/03/2015).  His counts plateaued on 07/25/2015 and have subsequently decreased.  Labs on 07/25/2015 revealed a normal B12 (894), folate (60.5), and TSH (2.562).  Ferritin was 50 on 04/26/2016.  Labs on 08/22/2016 revealed a normal B12, folate, and TSH.  He is on oral B12.  B12 was 693 on 05/27/2017.  Bone marrow on 11/03/2015 revealed persistent plasma cell neoplasm, now with mildy atypical monoclonal plasma cells estimated at 20-30%.   There was no morphologic evidence of residual hairy cell leukemia.  There was  a minute population (0.03%) with hairy cell immunophenotype detected by flow.  Marrow was normocellular for age with relative erythroid hyperplasia, relative myeloid hypoplasia, mild dyspoiesis, adequate megakaryocytes and no increased blasts.  There was no significant increase in marrow reticulin fibers.  Iron was present.  FISH studies for MDS were negative.  FISH panel for myeloma revealed CCND1/IGH translocation-  t(11;14) and  loss of MAF/16q.  Cytogenetics were normal (46, XY).  Bone marrow on 03/13/2016 was normocellular to marginally hypercellular for age (20-40%) with relative erythroid hyperplasia and mild dyserythropoiesis, decreased granulopoiesis and adequate megakaryocytes.  There was a persistent 15-20% plasma cell neoplasm.  There was no overt increase in reticulin fibrosis.  Storage iron was present.  Flow cytometry revealed abnormal/monocytic plasma cell population (3% sample), consistent with a plasma cell neoplastic process.  Cytogenetics were normal (46, XY).  FISH studies for myeloma revealed a CCND1/IGH translocation, t(11;14) in 53% of nuclei.  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  There was no evidence of leukemia, lymphoma, or high grade dysplasia.  Cytogenetics were normal (46, XY).  SPEP has been followed: 1.0 on 02/03/2015, 1.4 gm/dL on 07/25/2015, 1.3 gm/dL on 11/17/2015, 1.2 gm/dL on 06/28/2016, 1.0 gm/dL on 09/20/2016, 0.8 gm/dL on 04/29/2017, and 0.7 gm/dl [facility]) on 05/12/2017.    Kappa free light chains have been followed: 47.63 (ratio 3.12) on 02/03/2015, 54.9 (ratio 3.14) on 02/07/2015, 46.23 (ratio 3.68) on 07/25/2015, and 50.5 (ratio 4.82) on 11/17/2015.  Beta2-microglobulin was 2.5 on 11/17/2015.  24 hour urine on 11/21/2015 revealed 234.8 mg protein in 24 hours (no monoclonal protein).  Bone survey on 11/24/2015 revealed no lytic lesions.  Epo level was 329.6 on 03/29/2016.  He began a trial of Procrit.  He received 10,000 units of Procrit weekly (04/26/2016 - 06/14/2016).  Hemoglobin ranged between 8.4 and 8.9 without trend.    He is s/p 1 cycle ofRevlimid (10 mg) and Decadron(06/29/2016 - 07/19/2016). Cycle #1 was complicated by anemia and neutropenia. He has received 4 units of PRBCs(1 unit:07/22/2016 and 2 units: 08/07/2016, 1 unit on 01/14/2017, and 1 unit on 02/27/2017; 1 unit on 04/30/2017). He  received 6 days ofGCSF.  He is s/p 5 cycles of Revlimid (5 mg) and Decadron (09/14/2016 - 04/01/2017).  Revlimid was on temporary hold beginning 03/04/2017.  Revlimid was discontinued on 05/27/2017.  He began weekly GCSF on 11/05/2016 (last 06/10/2017).  He receives GCSF if his ANC is <= 1500.  He is on aspirin 81 mg a day prophylaxis.  PET scan on 03/14/2016 revealed no evidence of hypermetabolic lymphadenopathy or focal skeletal lesions to suggest marrow or cortical bone involvement.  Echo on 06/06/2017 revealed an EF of 65-70%.  Mitral valve was calcified.  The left atrium was mildly dilated.  Symptomatically, he feels "fair".  Exam is stable.  Counts include a hematocrit 26.5, hemoglobin of 9.1, platelets 78,000, and WBC 2500 with ANC 1800.  Plan: 1.  Labs today:  CBC with diff, CMP, SPEP, FLCA, hold tube. 2.  Discuss continued surveillance. Awaiting input from Dr. [name]. Review that preliminary marrow report notes kappa restricted plasmacytosis rather than the suspected myelodysplastic syndrome.  Given the marrow results, Vidaza is not indicated.  Discuss ongoing observation as counts stable. 3.  Discussed anemia.  Hemoglobin 9.1 today. Patient notes that he feels "fair" and he does not need a transfusion.  4.  ANC 1800. Patient receives GCSF if ANC < 1500. No GCSF today. 5.  RTC every  2 weeks for labs (CBC with diff).  6.  RTC in 6 weeks for MD assessment, labs (CBC with diff, CMP).   Honor Loh, NP  09/01/2017, 3:09 PM   I saw and evaluated the patient, participating in the key portions of the service and reviewing pertinent diagnostic studies and records.  I reviewed the nurse practitioner's note and agree with the findings and the plan.  The assessment and plan were discussed with the patient.  Multiple questions were asked by the patient and answered.   Lequita Asal, MD  09/01/2017, 3:09 PM

## 2017-09-02 LAB — KAPPA/LAMBDA LIGHT CHAINS
Kappa free light chain: 44.3 mg/L — ABNORMAL HIGH (ref 3.3–19.4)
Kappa, lambda light chain ratio: 4.22 — ABNORMAL HIGH (ref 0.26–1.65)
Lambda free light chains: 10.5 mg/L (ref 5.7–26.3)

## 2017-09-05 ENCOUNTER — Other Ambulatory Visit: Payer: Self-pay | Admitting: Family Medicine

## 2017-09-15 ENCOUNTER — Inpatient Hospital Stay: Payer: Medicare Other

## 2017-09-15 DIAGNOSIS — D649 Anemia, unspecified: Secondary | ICD-10-CM | POA: Diagnosis not present

## 2017-09-15 DIAGNOSIS — D72822 Plasmacytosis: Secondary | ICD-10-CM | POA: Diagnosis not present

## 2017-09-15 DIAGNOSIS — C9140 Hairy cell leukemia not having achieved remission: Secondary | ICD-10-CM | POA: Diagnosis not present

## 2017-09-15 DIAGNOSIS — C9141 Hairy cell leukemia, in remission: Secondary | ICD-10-CM

## 2017-09-15 DIAGNOSIS — C9000 Multiple myeloma not having achieved remission: Secondary | ICD-10-CM | POA: Diagnosis not present

## 2017-09-15 LAB — CBC WITH DIFFERENTIAL/PLATELET
Basophils Absolute: 0 10*3/uL (ref 0–0.1)
Basophils Relative: 1 %
Eosinophils Absolute: 0.1 10*3/uL (ref 0–0.7)
Eosinophils Relative: 2 %
HCT: 25.3 % — ABNORMAL LOW (ref 40.0–52.0)
Hemoglobin: 8.7 g/dL — ABNORMAL LOW (ref 13.0–18.0)
Lymphocytes Relative: 18 %
Lymphs Abs: 0.5 10*3/uL — ABNORMAL LOW (ref 1.0–3.6)
MCH: 37.8 pg — ABNORMAL HIGH (ref 26.0–34.0)
MCHC: 34.5 g/dL (ref 32.0–36.0)
MCV: 109.6 fL — ABNORMAL HIGH (ref 80.0–100.0)
Monocytes Absolute: 0.2 10*3/uL (ref 0.2–1.0)
Monocytes Relative: 7 %
Neutro Abs: 2 10*3/uL (ref 1.4–6.5)
Neutrophils Relative %: 72 %
Platelets: 86 10*3/uL — ABNORMAL LOW (ref 150–440)
RBC: 2.31 MIL/uL — ABNORMAL LOW (ref 4.40–5.90)
RDW: 15.6 % — ABNORMAL HIGH (ref 11.5–14.5)
WBC: 2.7 10*3/uL — ABNORMAL LOW (ref 3.8–10.6)

## 2017-09-15 LAB — SAMPLE TO BLOOD BANK

## 2017-09-19 ENCOUNTER — Encounter: Payer: Self-pay | Admitting: Hematology and Oncology

## 2017-09-29 ENCOUNTER — Inpatient Hospital Stay: Payer: Medicare Other | Attending: Hematology and Oncology

## 2017-09-29 DIAGNOSIS — C9000 Multiple myeloma not having achieved remission: Secondary | ICD-10-CM | POA: Diagnosis not present

## 2017-09-29 DIAGNOSIS — D469 Myelodysplastic syndrome, unspecified: Secondary | ICD-10-CM | POA: Diagnosis not present

## 2017-09-29 DIAGNOSIS — Z856 Personal history of leukemia: Secondary | ICD-10-CM | POA: Diagnosis not present

## 2017-09-29 DIAGNOSIS — Z95828 Presence of other vascular implants and grafts: Secondary | ICD-10-CM

## 2017-09-29 DIAGNOSIS — C9141 Hairy cell leukemia, in remission: Secondary | ICD-10-CM

## 2017-09-29 LAB — COMPREHENSIVE METABOLIC PANEL
ALT: 17 U/L (ref 17–63)
AST: 24 U/L (ref 15–41)
Albumin: 3.8 g/dL (ref 3.5–5.0)
Alkaline Phosphatase: 55 U/L (ref 38–126)
Anion gap: 5 (ref 5–15)
BUN: 24 mg/dL — ABNORMAL HIGH (ref 6–20)
CO2: 25 mmol/L (ref 22–32)
Calcium: 8.6 mg/dL — ABNORMAL LOW (ref 8.9–10.3)
Chloride: 103 mmol/L (ref 101–111)
Creatinine, Ser: 1.37 mg/dL — ABNORMAL HIGH (ref 0.61–1.24)
GFR calc Af Amer: 55 mL/min — ABNORMAL LOW (ref >60)
GFR calc non Af Amer: 47 mL/min — ABNORMAL LOW (ref >60)
Glucose, Bld: 237 mg/dL — ABNORMAL HIGH (ref 65–99)
Potassium: 4 mmol/L (ref 3.5–5.1)
Sodium: 133 mmol/L — ABNORMAL LOW (ref 135–145)
Total Bilirubin: 0.7 mg/dL (ref 0.3–1.2)
Total Protein: 6.8 g/dL (ref 6.5–8.1)

## 2017-09-29 LAB — CBC WITH DIFFERENTIAL/PLATELET
Basophils Absolute: 0 10*3/uL (ref 0–0.1)
Basophils Relative: 1 %
Eosinophils Absolute: 0.1 10*3/uL (ref 0–0.7)
Eosinophils Relative: 3 %
HCT: 25.1 % — ABNORMAL LOW (ref 40.0–52.0)
Hemoglobin: 8.6 g/dL — ABNORMAL LOW (ref 13.0–18.0)
Lymphocytes Relative: 21 %
Lymphs Abs: 0.6 10*3/uL — ABNORMAL LOW (ref 1.0–3.6)
MCH: 36.9 pg — ABNORMAL HIGH (ref 26.0–34.0)
MCHC: 34.2 g/dL (ref 32.0–36.0)
MCV: 108 fL — ABNORMAL HIGH (ref 80.0–100.0)
Monocytes Absolute: 0.2 10*3/uL (ref 0.2–1.0)
Monocytes Relative: 7 %
Neutro Abs: 1.9 10*3/uL (ref 1.4–6.5)
Neutrophils Relative %: 68 %
Platelets: 83 10*3/uL — ABNORMAL LOW (ref 150–440)
RBC: 2.33 MIL/uL — ABNORMAL LOW (ref 4.40–5.90)
RDW: 15.7 % — ABNORMAL HIGH (ref 11.5–14.5)
WBC: 2.7 10*3/uL — ABNORMAL LOW (ref 3.8–10.6)

## 2017-09-29 MED ORDER — HEPARIN SOD (PORK) LOCK FLUSH 100 UNIT/ML IV SOLN
500.0000 [IU] | Freq: Once | INTRAVENOUS | Status: AC
  Administered 2017-09-29: 500 [IU] via INTRAVENOUS

## 2017-09-29 MED ORDER — SODIUM CHLORIDE 0.9% FLUSH
10.0000 mL | INTRAVENOUS | Status: DC | PRN
  Administered 2017-09-29: 10 mL via INTRAVENOUS
  Filled 2017-09-29: qty 10

## 2017-09-30 LAB — PROTEIN ELECTROPHORESIS, SERUM
A/G Ratio: 1.1 (ref 0.7–1.7)
Albumin ELP: 3.5 g/dL (ref 2.9–4.4)
Alpha-1-Globulin: 0.2 g/dL (ref 0.0–0.4)
Alpha-2-Globulin: 0.7 g/dL (ref 0.4–1.0)
Beta Globulin: 0.9 g/dL (ref 0.7–1.3)
Gamma Globulin: 1.3 g/dL (ref 0.4–1.8)
Globulin, Total: 3.1 g/dL (ref 2.2–3.9)
M-Spike, %: 1.2 g/dL — ABNORMAL HIGH
Total Protein ELP: 6.6 g/dL (ref 6.0–8.5)

## 2017-10-01 ENCOUNTER — Other Ambulatory Visit: Payer: Self-pay | Admitting: Family Medicine

## 2017-10-02 NOTE — Telephone Encounter (Signed)
Last filled:  09/08/17, #120 Last OV:  06/16/17 Next OV:  11/14/17

## 2017-10-09 ENCOUNTER — Other Ambulatory Visit: Payer: Self-pay | Admitting: Family Medicine

## 2017-10-10 ENCOUNTER — Other Ambulatory Visit: Payer: Self-pay | Admitting: *Deleted

## 2017-10-10 DIAGNOSIS — D46Z Other myelodysplastic syndromes: Secondary | ICD-10-CM

## 2017-10-11 ENCOUNTER — Other Ambulatory Visit: Payer: Self-pay | Admitting: Hematology and Oncology

## 2017-10-11 DIAGNOSIS — Z7189 Other specified counseling: Secondary | ICD-10-CM

## 2017-10-11 DIAGNOSIS — D4620 Refractory anemia with excess of blasts, unspecified: Secondary | ICD-10-CM

## 2017-10-11 DIAGNOSIS — D539 Nutritional anemia, unspecified: Secondary | ICD-10-CM

## 2017-10-11 DIAGNOSIS — E538 Deficiency of other specified B group vitamins: Secondary | ICD-10-CM

## 2017-10-11 DIAGNOSIS — C9141 Hairy cell leukemia, in remission: Secondary | ICD-10-CM

## 2017-10-11 DIAGNOSIS — C9000 Multiple myeloma not having achieved remission: Secondary | ICD-10-CM

## 2017-10-11 NOTE — Progress Notes (Signed)
[facility] day:  10/13/2017    Chief Complaint: Alex Smith is a 83 y.o. male with a myelodysplastic marrow, smoldering myeloma, and a history of hairy cell leukemia who is seen for 6 week assessment.  HPI:  The patient was last seen in the medical oncology clinic on 09/01/2017.  At that time, he felt "fair".  Exam was stable.  Counts included a hematocrit 26.5, hemoglobin of 9.1, platelets 78,000, and WBC 2500 with ANC 1800.  Kappa free light chains were 44.3, lambda free light chains were 10.5 with a ratio of 4.22.  Labs on 09/29/2017 revealed a hematocrit of 25.1, hemoglobin 8.6, MCV 108, platelets 83,000, WBC 2700 with an AN Cof 1900.  M spike was 1.2 gm/dL.  Creatinine was 1.37.  Calcium was 8.6.  Protein was 6.8.  During the interim, patient has been doing well. He denies any acute concerns today. Patient denies B symptoms and interval infections. He is scheduled to see. Dr. [name] on 12/01/2017.  Patient is eating well. His weight has increased 4 pounds. Patient denies pain in the clinic today.    Past Medical History:  Diagnosis Date  . Anemia   . B12 deficiency   . Basal cell carcinoma of face   . BPH (benign prostatic hypertrophy)    followed by urology, discharged (Dr. Bernardo Heater)  . CAP (community acquired pneumonia) 02/15/2015  . Colon polyps   . Diverticulosis   . GERD (gastroesophageal reflux disease)   . Hairy cell leukemia ([place]) 2006   recurrent, seizure on rituxan, now on cladribine ([place])  . History of pneumonia 2000's   "once" (07/07/2012)  . History of shingles   . HLD (hyperlipidemia)   . Hypertension   . Pneumonia   . Shortness of breath dyspnea   . Systolic murmur 09/62/8366  . Type 2 diabetes, controlled, with retinopathy ([place])     Past Surgical History:  Procedure Laterality Date  . [place] VITRECTOMY WITH 20 GAUGE MVR PORT FOR MACULAR HOLE  07/07/2012   Procedure: 25 GAUGE PARS  PLANA VITRECTOMY WITH 20 GAUGE MVR PORT FOR MACULAR HOLE;  Surgeon: Hayden Pedro, MD;  Location: [facility];  Service: Ophthalmology;  Laterality: Left;  . BONE MARROW BIOPSY  2016  . CARDIOVASCULAR STRESS TEST  2013   treadmill - no evidence ischemia, EF 61%  . CATARACT EXTRACTION W/ INTRAOCULAR LENS  IMPLANT, BILATERAL  ~ 2010  . COLONOSCOPY  2014   [place] WNL no rpt needed, h/o polyps  . EYE SURGERY Left 06/2012   laser surgery  . GAS INSERTION  07/07/2012   Procedure: INSERTION OF GAS;  Surgeon: Hayden Pedro, MD;  Location: [facility];  Service: Ophthalmology;  Laterality: Left;  [id number]  . PERIPHERAL VASCULAR CATHETERIZATION N/A 02/23/2015   Procedure: Glori Luis Cath Insertion;  Surgeon: Algernon Huxley, MD;  Location: [facility];  Service: Cardiovascular;  Laterality: N/A;  . SERUM PATCH  07/07/2012   Procedure: SERUM PATCH;  Surgeon: Hayden Pedro, MD;  Location: [facility];  Service: Ophthalmology;  Laterality: Left;  . SKIN CANCER EXCISION     "all over my face" (07/07/2012)    Family History  Problem Relation Age of Onset  . Dementia Mother   . Heart failure Father 35  . Cancer Sister        breast  . Diabetes Paternal Uncle   . Diabetes Paternal Aunt   . CAD Brother  73       MI  . Stroke Neg Hx     Social History:  reports that he quit smoking about 48 years ago. His smoking use included cigarettes. He has a 50.00 pack-year smoking history. he has never used smokeless tobacco. He reports that he drinks alcohol. He reports that he does not use drugs.  He works at [employer] 20-32 hours/week.  The patient 's wife has had 3 hip operations.  She underwent hip replacement 03/06/2016.  His wife is in [place].  He states that they either eat out or bring food in.  His best contact number is his cell: [phone]. Patient has a daughter [name]). He is alone today.  Allergies:  Allergies  Allergen Reactions  . Rituximab Rash    Chest tightness Chest tightness  . Blood-Group  Specific Substance Other (See Comments)    Had a post transfusion reaction of red blood cells; NOW REQUIRES WASHED BLOOD CELLS Had a post transfusion reaction of red blood cells; NOW REQUIRES WASHED BLOOD CELLS  . Primaxin [Imipenem] Other (See Comments)    Possible allergy Possible allergy  . Voriconazole Other (See Comments)  . Sulfa Antibiotics Itching and Rash  . Sulfacetamide Sodium Itching and Rash  Possible allergy to Zoloft, allopurinol, and a chemotherapy medication.  He tolerates Dapsone.  Current Medications: Current Outpatient Medications  Medication Sig Dispense Refill  . ACCU-CHEK AVIVA PLUS test strip USE TO CHECK SUGAR TWICE DAILY 200 each 0  . acyclovir (ZOVIRAX) 400 MG tablet TAKE TWO TABLETS BY MOUTH TWICE DAILY 120 tablet 3  . aspirin EC 81 MG tablet Take 81 mg by mouth daily.    Marland Kitchen HUMULIN N 100 UNIT/ML injection INJECT 39 UNITS SUBCUTANEOUSLY TWICE DAILY BEFORE A MEAL 20 mL 6  . hydrochlorothiazide (HYDRODIURIL) 25 MG tablet TAKE 1 TABLET BY MOUTH EVERY DAY 30 tablet 1  . insulin regular (HUMULIN R) 100 units/mL injection Inject 0.1 mLs (10 Units total) into the skin 2 (two) times daily before a meal. (33 units daily) 10 mL 5  . lidocaine-prilocaine (EMLA) cream Apply 1 application topically as needed. 30 g 3  . lisinopril (PRINIVIL,ZESTRIL) 20 MG tablet TAKE ONE TABLET BY MOUTH TWICE DAILY 60 tablet 4  . metFORMIN (GLUCOPHAGE) 1000 MG tablet TAKE ONE TABLET TWICE DAILY 180 tablet 1  . MICROLET LANCETS MISC USE AS DIRECTED 200 each 3  . pravastatin (PRAVACHOL) 20 MG tablet TAKE ONE TABLET AT BEDTIME 30 tablet 4  . ranitidine (ZANTAC) 150 MG tablet TAKE ONE TABLET BY MOUTH TWICE DAILY AS NEEDED 180 tablet 3  . tamsulosin (FLOMAX) 0.4 MG CAPS capsule TAKE 1 CAPSULE BY MOUTH EVERY DAY 30 capsule 4  . temazepam (RESTORIL) 7.5 MG capsule Take 7.5 mg by mouth once.     . traZODone (DESYREL) 50 MG tablet TAKE 1/2 TO 1 TABLET BY MOUTH AT BEDTIMEAS NEEDED FOR SLEEP 30 tablet 0   . vitamin E 400 UNIT capsule Take 200 Units by mouth daily.      Current Facility-Administered Medications  Medication Dose Route Frequency Provider Last Rate Last Dose  . Tbo-Filgrastim (GRANIX) injection 480 mcg  480 mcg Subcutaneous Once Lequita Asal, MD       Facility-Administered Medications Ordered in Other Visits  Medication Dose Route Frequency Provider Last Rate Last Dose  . heparin lock flush 100 unit/mL  500 Units Intravenous Once [place], Melissa C, MD      . sodium chloride 0.9 % injection 10 mL  10 mL Intravenous PRN Lequita Asal, MD   10 mL at 03/03/15 0903  . sodium chloride 0.9 % injection 10 mL  10 mL Intracatheter PRN Lequita Asal, MD   10 mL at 03/10/15 1410   Review of Systems:  GENERAL:  Feels "fait to [place]".  Some fatigue.  No fevers or sweats.  Weight up 4 pounds.  PERFORMANCE STATUS (ECOG):  1 HEENT: No visual changes, runny nose, sore throat. Pulmonary:  Shortness of breath on exertion.  No cough.  No hemoptysis. Cardiac:  No chest pain, palpitations, orthopnea, or PND.  Heart murmur. GI:  Appetite good.  No nausea, vomiting, diarrhea, constipation, melena or hematochezia.  GU:  No urgency, frequency, dysuria, or hematuria.  On Flomax. Musculoskeletal:  No back pain.  No joint pain.  No muscle tenderness. Extremities:  No pain or swelling. Skin:  No rashes or skin changes. Neuro:  No headache, numbness or weakness, balance or coordination issues. Endocrine:  Diabetes.  No thyroid issues, hot flashes or night sweats. Psych:  No mood changes, depression or anxiety.  Pain:  No focal pain. Review of systems:  All other systems reviewed and found to be negative.   Physical Exam: Blood pressure (!) 167/80, pulse 80, temperature 98.1 F (36.7 C), resp. rate 20, weight 226 lb (102.5 kg).  GENERAL:  Well developed, well nourished, gentleman sitting comfortably in the exam room in no acute distress. MENTAL STATUS:  Alert and oriented to  person, place and time. HEAD:  White hair.  Normocephalic, atraumatic, face symmetric, no Cushingoid features. EYES:  Glasses. Blue eyes.  Pupils equal round and reactive to light and accomodation.  No conjunctivitis or scleral icterus. ENT:  Oropharynx clear without lesion.  Tongue normal. Mucous membranes moist.  RESPIRATORY:  Clear to auscultation without rales, wheezes or rhonchi. CARDIOVASCULAR:  Grade III systolic murmur (stable). Regular rate and rhythm without, rub or gallop. ABDOMEN:  Soft, non-tender, with active bowel sounds, and no hepatosplenomegaly.  No masses. SKIN:  No rashes or ulcers. EXTREMITIES: Bilateral lower extremity edema (L>R).  No skin discoloration or tenderness.  No palpable cords. LYMPH NODES: No palpable cervical, supraclavicular, axillary or inguinal adenopathy  NEUROLOGICAL: Unremarkable. PSYCH:  Appropriate.    Appointment on 10/13/2017  Component Date Value Ref Range Status  . WBC 10/13/2017 2.8* 3.8 - 10.6 K/uL Final  . RBC 10/13/2017 2.42* 4.40 - 5.90 MIL/uL Final  . Hemoglobin 10/13/2017 9.2* 13.0 - 18.0 g/dL Final  . HCT 10/13/2017 26.4* 40.0 - 52.0 % Final  . MCV 10/13/2017 109.2* 80.0 - 100.0 fL Final  . MCH 10/13/2017 38.0* 26.0 - 34.0 pg Final  . MCHC 10/13/2017 34.8  32.0 - 36.0 g/dL Final  . RDW 10/13/2017 15.1* 11.5 - 14.5 % Final  . Platelets 10/13/2017 85* 150 - 440 K/uL Final  . Neutrophils Relative % 10/13/2017 63  % Final  . Neutro Abs 10/13/2017 1.8  1.4 - 6.5 K/uL Final  . Lymphocytes Relative 10/13/2017 25  % Final  . Lymphs Abs 10/13/2017 0.7* 1.0 - 3.6 K/uL Final  . Monocytes Relative 10/13/2017 8  % Final  . Monocytes Absolute 10/13/2017 0.2  0.2 - 1.0 K/uL Final  . Eosinophils Relative 10/13/2017 3  % Final  . Eosinophils Absolute 10/13/2017 0.1  0 - 0.7 K/uL Final  . Basophils Relative 10/13/2017 1  % Final  . Basophils Absolute 10/13/2017 0.0  0 - 0.1 K/uL Final   Performed at [facility], [address]  [place], [place] [address]    Assessment:  Alex Smith is a 83 y.o. 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  He was diagnosed with hairy cell leukemia in 2006.  He received cladribine 11/06-11/05/2005.  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   Bone marrow aspirate and biopsy on 02/07/2015 revealed a extensive marrow involvement by recurrent hairy cell leukemia (approximately 80% of cells in the core). There was a monoclonal plasma cell infiltrate (approximately 10%), compatible with a plasma cell neoplasm. Marrow was hypercellular (40-50%) with markedly diminished residual trilineage hematopoiesis and mild multilineage dyspoiesis.  Peripheral smear revealed leukopenia (WBC 1900) with 2% atypical lymphoid cells compatible with hairy cell leukemia. There was moderate anemia with anisopoikilocytosis. 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  FISH studies for MDS were negative.  Cytogenetics were normal (46, XY).  He has an unclear allergy history.  He may be allergic to Primaxin, Zoloft, allopurinol, and a chemotherapy medication.  Reaction occurred in 06/2005.  He is allergic to sulfa.  He is not allergic to dapsone.  He requires washed RBCs.  He is on prophylactic acyclovir.  Dapsone and voriconazole were discontinued.  He has a history of shingles prior to prophylactic acyclovir.  He is 17 months status post cladribine (began 03/03/2015).  His counts plateaued on 07/25/2015 and have subsequently decreased.  Labs on 07/25/2015 revealed a normal B12 (894), folate (60.5), and TSH (2.562).  Ferritin was 50 on 04/26/2016.  Labs on 08/22/2016 revealed a normal B12, folate, and TSH.  He is on oral B12.  B12 was 693 on 05/27/2017.  Bone marrow on 11/03/2015 revealed persistent plasma cell neoplasm, now with mildy atypical monoclonal plasma cells estimated at 20-30%.   There was no morphologic evidence of  residual hairy cell leukemia.  There was a minute population (0.03%) with hairy cell immunophenotype detected by flow.  Marrow was normocellular for age with relative erythroid hyperplasia, relative myeloid hypoplasia, mild dyspoiesis, adequate megakaryocytes and no increased blasts.  There was no significant increase in marrow reticulin fibers.  Iron was present.  FISH studies for MDS were negative.  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  Cytogenetics were normal (46, XY).  Bone marrow on 03/13/2016 was normocellular to marginally hypercellular for age (20-40%) with relative erythroid hyperplasia and mild dyserythropoiesis, decreased granulopoiesis and adequate megakaryocytes.  There was a persistent 15-20% plasma cell neoplasm.  There was no overt increase in reticulin fibrosis.  Storage iron was present.  Flow cytometry revealed abnormal/monocytic plasma cell population (3% sample), consistent with a plasma cell neoplastic process.  Cytogenetics were normal (46, XY).  FISH studies for myeloma revealed a CCND1/IGH translocation, t(11;14) in 53% of nuclei.  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  There was no evidence of leukemia, lymphoma, or high grade dysplasia.  Cytogenetics were normal (46, XY).  SPEP has been followed: 1.0 gm/dL on 02/03/2015, 1.4 on 07/25/2015, 1.3 on 11/17/2015, 1.2 on 06/28/2016, 1.0 on 09/20/2016, 0.8 on 04/29/2017, 0.7 [facility]) on 05/12/2017, and 1.2 on 09/29/2017.    Kappa free light chains have been followed: 47.63 (ratio 3.12) on 02/03/2015, 54.9 (ratio 3.14) on 02/07/2015, 46.23 (ratio 3.68) on 07/25/2015, 50.5 (ratio 4.82) on 11/17/2015, and 44.3 (ratio 4.22) on 09/01/2017.  Beta2-microglobulin was 2.5 on 11/17/2015.  24 hour urine on 11/21/2015 revealed 234.8 mg protein in 24  hours (no monoclonal protein).  Bone survey on 11/24/2015 revealed no  lytic lesions.  Epo level was 329.6 on 03/29/2016.  He began a trial of Procrit.  He received 10,000 units of Procrit weekly (04/26/2016 - 06/14/2016).  Hemoglobin ranged between 8.4 and 8.9 without trend.    He is s/p 1 cycle ofRevlimid (10 mg) and Decadron(06/29/2016 - 07/19/2016). Cycle #1 was complicated by anemia and neutropenia. He has received 4 units of PRBCs(1 unit:07/22/2016 and 2 units: 08/07/2016, 1 unit on 01/14/2017, and 1 unit on 02/27/2017; 1 unit on 04/30/2017). He received 6 days ofGCSF.  He is s/p 5 cycles of Revlimid (5 mg) and Decadron (09/14/2016 - 04/01/2017).  Revlimid was on temporary hold beginning 03/04/2017.  Revlimid was discontinued on 05/27/2017.  He began weekly GCSF on 11/05/2016 (last 06/10/2017).  He receives GCSF if his ANC is <= 1500.  He is on aspirin 81 mg a day prophylaxis.  PET scan on 03/14/2016 revealed no evidence of hypermetabolic lymphadenopathy or focal skeletal lesions to suggest marrow or cortical bone involvement.  Echo on 06/06/2017 revealed an EF of 65-70%.  Mitral valve was calcified.  The left atrium was mildly dilated.  Symptomatically, he feels "fair".  Exam is stable.  Counts include a hematocrit 26.4, hemoglobin of 9.2, platelets 85,000, and WBC 2800 with ANC 1800.  Plan: 1.  Labs today: CBC with diff, hold tube. 2.  Discuss continued surveillance. Awaiting input from Dr. [name]. Patient scheduled to see Dr. Evelene Croon on 12/01/2017. Review that preliminary marrow report notes kappa restricted plasmacytosis rather than the suspected myelodysplastic syndrome.  Given the marrow results, Vidaza was not indicated.  Discuss ongoing observation as counts stable. 3.  Discussed anemia.  Hemoglobin 9.2 today. Patient feels generally well. 4.  ANC 1800. Patient receives GCSF if ANC < 1500. No GCSF today. 5.  RTC every 2 weeks for labs (CBC with diff).  Discuss checking labs less frequently.  Patient wishes to continue current  schedule. 6.  RTC in 3 months for MD assessment and labs (CBC with diff, CMP).   Honor Loh, NP  10/13/2017, 3:24 PM   I saw and evaluated the patient, participating in the key portions of the service and reviewing pertinent diagnostic studies and records.  I reviewed the nurse practitioner's note and agree with the findings and the plan.  The assessment and plan were discussed with the patient.  Multiple questions were asked by the patient and answered.   Lequita Asal, MD  10/13/2017, 3:24 PM

## 2017-10-13 ENCOUNTER — Inpatient Hospital Stay: Payer: Medicare Other | Admitting: Hematology and Oncology

## 2017-10-13 ENCOUNTER — Inpatient Hospital Stay: Payer: Medicare Other

## 2017-10-13 ENCOUNTER — Encounter: Payer: Self-pay | Admitting: Hematology and Oncology

## 2017-10-13 VITALS — BP 167/80 | HR 80 | Temp 98.1°F | Resp 20 | Wt 226.0 lb

## 2017-10-13 DIAGNOSIS — D469 Myelodysplastic syndrome, unspecified: Secondary | ICD-10-CM

## 2017-10-13 DIAGNOSIS — D539 Nutritional anemia, unspecified: Secondary | ICD-10-CM

## 2017-10-13 DIAGNOSIS — Z7189 Other specified counseling: Secondary | ICD-10-CM

## 2017-10-13 DIAGNOSIS — C9141 Hairy cell leukemia, in remission: Secondary | ICD-10-CM

## 2017-10-13 DIAGNOSIS — E538 Deficiency of other specified B group vitamins: Secondary | ICD-10-CM

## 2017-10-13 DIAGNOSIS — C9000 Multiple myeloma not having achieved remission: Secondary | ICD-10-CM

## 2017-10-13 DIAGNOSIS — Z856 Personal history of leukemia: Secondary | ICD-10-CM

## 2017-10-13 DIAGNOSIS — D4620 Refractory anemia with excess of blasts, unspecified: Secondary | ICD-10-CM

## 2017-10-13 DIAGNOSIS — D46Z Other myelodysplastic syndromes: Secondary | ICD-10-CM

## 2017-10-13 LAB — CBC WITH DIFFERENTIAL/PLATELET
Basophils Absolute: 0 10*3/uL (ref 0–0.1)
Basophils Relative: 1 %
Eosinophils Absolute: 0.1 10*3/uL (ref 0–0.7)
Eosinophils Relative: 3 %
HCT: 26.4 % — ABNORMAL LOW (ref 40.0–52.0)
Hemoglobin: 9.2 g/dL — ABNORMAL LOW (ref 13.0–18.0)
Lymphocytes Relative: 25 %
Lymphs Abs: 0.7 10*3/uL — ABNORMAL LOW (ref 1.0–3.6)
MCH: 38 pg — ABNORMAL HIGH (ref 26.0–34.0)
MCHC: 34.8 g/dL (ref 32.0–36.0)
MCV: 109.2 fL — ABNORMAL HIGH (ref 80.0–100.0)
Monocytes Absolute: 0.2 10*3/uL (ref 0.2–1.0)
Monocytes Relative: 8 %
Neutro Abs: 1.8 10*3/uL (ref 1.4–6.5)
Neutrophils Relative %: 63 %
Platelets: 85 10*3/uL — ABNORMAL LOW (ref 150–440)
RBC: 2.42 MIL/uL — ABNORMAL LOW (ref 4.40–5.90)
RDW: 15.1 % — ABNORMAL HIGH (ref 11.5–14.5)
WBC: 2.8 10*3/uL — ABNORMAL LOW (ref 3.8–10.6)

## 2017-10-13 LAB — SAMPLE TO BLOOD BANK

## 2017-10-13 NOTE — Progress Notes (Signed)
Patient offers no complaints today. 

## 2017-10-27 ENCOUNTER — Inpatient Hospital Stay: Payer: Medicare Other | Attending: Hematology and Oncology

## 2017-10-27 DIAGNOSIS — C9000 Multiple myeloma not having achieved remission: Secondary | ICD-10-CM | POA: Insufficient documentation

## 2017-10-27 DIAGNOSIS — D539 Nutritional anemia, unspecified: Secondary | ICD-10-CM

## 2017-10-27 LAB — CBC WITH DIFFERENTIAL/PLATELET
Basophils Absolute: 0 10*3/uL (ref 0–0.1)
Basophils Relative: 1 %
Eosinophils Absolute: 0.1 10*3/uL (ref 0–0.7)
Eosinophils Relative: 3 %
HCT: 26.5 % — ABNORMAL LOW (ref 40.0–52.0)
Hemoglobin: 9.4 g/dL — ABNORMAL LOW (ref 13.0–18.0)
Lymphocytes Relative: 24 %
Lymphs Abs: 0.7 10*3/uL — ABNORMAL LOW (ref 1.0–3.6)
MCH: 38.9 pg — ABNORMAL HIGH (ref 26.0–34.0)
MCHC: 35.5 g/dL (ref 32.0–36.0)
MCV: 109.6 fL — ABNORMAL HIGH (ref 80.0–100.0)
Monocytes Absolute: 0.2 10*3/uL (ref 0.2–1.0)
Monocytes Relative: 7 %
Neutro Abs: 1.8 10*3/uL (ref 1.4–6.5)
Neutrophils Relative %: 65 %
Platelets: 88 10*3/uL — ABNORMAL LOW (ref 150–440)
RBC: 2.42 MIL/uL — ABNORMAL LOW (ref 4.40–5.90)
RDW: 15.5 % — ABNORMAL HIGH (ref 11.5–14.5)
WBC: 2.8 10*3/uL — ABNORMAL LOW (ref 3.8–10.6)

## 2017-11-06 ENCOUNTER — Other Ambulatory Visit: Payer: Self-pay | Admitting: Family Medicine

## 2017-11-10 ENCOUNTER — Inpatient Hospital Stay: Payer: Medicare Other

## 2017-11-10 DIAGNOSIS — Z95828 Presence of other vascular implants and grafts: Secondary | ICD-10-CM

## 2017-11-10 DIAGNOSIS — C9000 Multiple myeloma not having achieved remission: Secondary | ICD-10-CM | POA: Diagnosis not present

## 2017-11-10 DIAGNOSIS — D539 Nutritional anemia, unspecified: Secondary | ICD-10-CM

## 2017-11-10 LAB — CBC WITH DIFFERENTIAL/PLATELET
Basophils Absolute: 0 10*3/uL (ref 0–0.1)
Basophils Relative: 1 %
Eosinophils Absolute: 0.1 10*3/uL (ref 0–0.7)
Eosinophils Relative: 3 %
HCT: 25.2 % — ABNORMAL LOW (ref 40.0–52.0)
Hemoglobin: 8.8 g/dL — ABNORMAL LOW (ref 13.0–18.0)
Lymphocytes Relative: 24 %
Lymphs Abs: 0.7 10*3/uL — ABNORMAL LOW (ref 1.0–3.6)
MCH: 38.3 pg — ABNORMAL HIGH (ref 26.0–34.0)
MCHC: 35.1 g/dL (ref 32.0–36.0)
MCV: 109.4 fL — ABNORMAL HIGH (ref 80.0–100.0)
Monocytes Absolute: 0.3 10*3/uL (ref 0.2–1.0)
Monocytes Relative: 10 %
Neutro Abs: 1.8 10*3/uL (ref 1.4–6.5)
Neutrophils Relative %: 62 %
Platelets: 80 10*3/uL — ABNORMAL LOW (ref 150–440)
RBC: 2.31 MIL/uL — ABNORMAL LOW (ref 4.40–5.90)
RDW: 15.1 % — ABNORMAL HIGH (ref 11.5–14.5)
WBC: 2.9 10*3/uL — ABNORMAL LOW (ref 3.8–10.6)

## 2017-11-10 MED ORDER — HEPARIN SOD (PORK) LOCK FLUSH 100 UNIT/ML IV SOLN
500.0000 [IU] | INTRAVENOUS | Status: AC | PRN
  Administered 2017-11-10: 500 [IU]

## 2017-11-10 MED ORDER — SODIUM CHLORIDE 0.9% FLUSH
10.0000 mL | INTRAVENOUS | Status: AC | PRN
  Administered 2017-11-10: 10 mL
  Filled 2017-11-10: qty 10

## 2017-11-14 ENCOUNTER — Ambulatory Visit (INDEPENDENT_AMBULATORY_CARE_PROVIDER_SITE_OTHER): Payer: Medicare Other | Admitting: Family Medicine

## 2017-11-14 ENCOUNTER — Encounter: Payer: Self-pay | Admitting: Family Medicine

## 2017-11-14 VITALS — BP 156/60 | HR 91 | Temp 98.2°F | Wt 224.0 lb

## 2017-11-14 DIAGNOSIS — E11319 Type 2 diabetes mellitus with unspecified diabetic retinopathy without macular edema: Secondary | ICD-10-CM

## 2017-11-14 DIAGNOSIS — E1122 Type 2 diabetes mellitus with diabetic chronic kidney disease: Secondary | ICD-10-CM

## 2017-11-14 DIAGNOSIS — Z794 Long term (current) use of insulin: Secondary | ICD-10-CM

## 2017-11-14 DIAGNOSIS — J069 Acute upper respiratory infection, unspecified: Secondary | ICD-10-CM | POA: Diagnosis not present

## 2017-11-14 DIAGNOSIS — N183 Chronic kidney disease, stage 3 unspecified: Secondary | ICD-10-CM | POA: Insufficient documentation

## 2017-11-14 DIAGNOSIS — I10 Essential (primary) hypertension: Secondary | ICD-10-CM | POA: Diagnosis not present

## 2017-11-14 DIAGNOSIS — N1831 Chronic kidney disease, stage 3a: Secondary | ICD-10-CM | POA: Insufficient documentation

## 2017-11-14 HISTORY — DX: Type 2 diabetes mellitus with diabetic chronic kidney disease: E11.22

## 2017-11-14 HISTORY — DX: Chronic kidney disease, stage 3 unspecified: N18.30

## 2017-11-14 MED ORDER — DOXAZOSIN MESYLATE 1 MG PO TABS: 1.0000 mg | ORAL_TABLET | Freq: Every day | ORAL | 6 refills | Status: DC

## 2017-11-14 NOTE — Patient Instructions (Addendum)
Continue tracking blood sugars, sometimes check 2 hours after a meal. Start checking blood pressures once daily and write down to bring to next appointment. Labs today.  Start cardura 3rd blood pressure medicine, keep an eye on blood pressures at home. Return in 3 months for medicare wellness visit with Katha Cabal and physical with me

## 2017-11-14 NOTE — Assessment & Plan Note (Signed)
Anticipate viral given short duration. Supportive care reviewed. Declines INS. rec nasal saline.

## 2017-11-14 NOTE — Assessment & Plan Note (Signed)
Chronic, elevated readings on blood work and recall cbg's however fructosamine/A1c stable. Anticipate fructosamine is a more accurate reflection of his glycemic control - update today.

## 2017-11-14 NOTE — Assessment & Plan Note (Addendum)
Baseline Cr seems to be 1.2-1.4.  Update renal panel today. Consider taper off hctz.

## 2017-11-14 NOTE — Progress Notes (Signed)
BP (!) 156/60 (BP Location: Right Arm, Cuff Size: Large)   Pulse 91   Temp 98.2 F (36.8 C) (Oral)   Wt 224 lb (101.6 kg)   SpO2 94%   BMI 35.08 kg/m    CC: 5 mo f/u visit Subjective:    Patient ID: Alex Smith, male    DOB: 1935-04-13, 83 y.o.   MRN: [id number]  HPI: Alex Smith is a 83 y.o. male presenting on 11/14/2017 for 5 mo follow-up   Known hairy cell leukemia, smoldering MM and early myelodysplasia followed by onc.   3d h/o head cold - sinus pressure, congestion. Has tried nothing for this yet. No fevers/chills. Tmax 99.2. + sick contacts at home.   HTN - Compliant with current antihypertensive regimen of hctz 25mg  daily, lisinopril 20mg  BID.  Does not check blood pressures at home: but has bp cuff. No low blood pressure readings or symptoms of dizziness/syncope.  Denies HA, vision changes, CP/tightness, SOB, leg swelling.    DM - does regularly check sugars, fasting daily, today 157. Compliant with antihyperglycemic regimen which includes: see below. Now off decadron. Denies low sugars or hypoglycemic symptoms. Denies paresthesias. Last diabetic eye exam 01/2017. Pneumovax: 2006. Prevnar: 2015. Glucometer brand: accuchek. DSME: completed remotely.  Lab Results  Component Value Date   HGBA1C 6.7 (H) 06/16/2017   Diabetic Foot Exam - Simple   No data filed     Lab Results  Component Value Date   MICROALBUR 1.0 10/05/2014   Current regimen: Humulin R 10 u bid with meals (breakfast and supper)  Humulin N NPH 39 u bid Mixes insulins Metformin 1000mg  bid    BPH related urinary symptoms despite flomax nightly. Nocturia x2.   Insomnia - on trazodone, temazepam and aleve at bedtime to sleep.   Relevant past medical, surgical, family and social history reviewed and updated as indicated. Interim medical history since our last visit reviewed. Allergies and medications reviewed and updated. Outpatient Medications Prior to Visit  Medication Sig Dispense Refill  .  ACCU-CHEK AVIVA PLUS test strip USE TO CHECK SUGAR TWICE DAILY 200 each 0  . acyclovir (ZOVIRAX) 400 MG tablet TAKE TWO TABLETS BY MOUTH TWICE DAILY 120 tablet 3  . aspirin EC 81 MG tablet Take 81 mg by mouth daily.    Marland Kitchen HUMULIN N 100 UNIT/ML injection INJECT 39 UNITS SUBCUTANEOUSLY TWICE DAILY BEFORE A MEAL 20 mL 6  . hydrochlorothiazide (HYDRODIURIL) 25 MG tablet TAKE 1 TABLET BY MOUTH EVERY DAY 30 tablet 1  . insulin regular (HUMULIN R) 100 units/mL injection Inject 0.1 mLs (10 Units total) into the skin 2 (two) times daily before a meal. (33 units daily) 10 mL 5  . lidocaine-prilocaine (EMLA) cream Apply 1 application topically as needed. 30 g 3  . lisinopril (PRINIVIL,ZESTRIL) 20 MG tablet TAKE ONE TABLET BY MOUTH TWICE DAILY 60 tablet 0  . metFORMIN (GLUCOPHAGE) 1000 MG tablet TAKE ONE TABLET TWICE DAILY 180 tablet 1  . MICROLET LANCETS MISC USE AS DIRECTED 200 each 3  . pravastatin (PRAVACHOL) 20 MG tablet TAKE ONE TABLET AT BEDTIME 30 tablet 4  . ranitidine (ZANTAC) 150 MG tablet TAKE ONE TABLET BY MOUTH TWICE DAILY AS NEEDED 180 tablet 3  . tamsulosin (FLOMAX) 0.4 MG CAPS capsule TAKE 1 CAPSULE BY MOUTH EVERY DAY 30 capsule 4  . temazepam (RESTORIL) 7.5 MG capsule Take 7.5 mg by mouth once.     . traZODone (DESYREL) 50 MG tablet TAKE 1/2 TO 1 TABLET  BY MOUTH AT BEDTIMEAS NEEDED FOR SLEEP 30 tablet 0  . vitamin E 400 UNIT capsule Take 200 Units by mouth daily.      Facility-Administered Medications Prior to Visit  Medication Dose Route Frequency Provider Last Rate Last Dose  . heparin lock flush 100 unit/mL  500 Units Intravenous Once Corcoran, Melissa C, MD      . sodium chloride 0.9 % injection 10 mL  10 mL Intravenous PRN Lequita Asal, MD   10 mL at 03/03/15 0903  . sodium chloride 0.9 % injection 10 mL  10 mL Intracatheter PRN Nolon Stalls C, MD   10 mL at 03/10/15 1410  . Tbo-Filgrastim (GRANIX) injection 480 mcg  480 mcg Subcutaneous Once Lequita Asal, MD          Per HPI unless specifically indicated in ROS section below Review of Systems     Objective:    BP (!) 156/60 (BP Location: Right Arm, Cuff Size: Large)   Pulse 91   Temp 98.2 F (36.8 C) (Oral)   Wt 224 lb (101.6 kg)   SpO2 94%   BMI 35.08 kg/m   Wt Readings from Last 3 Encounters:  11/14/17 224 lb (101.6 kg)  10/13/17 226 lb (102.5 kg)  09/01/17 222 lb 4.8 oz (100.8 kg)    Physical Exam  Constitutional: He appears well-developed and well-nourished. No distress.  HENT:  Head: Normocephalic and atraumatic.  Right Ear: External ear normal.  Left Ear: External ear normal.  Nose: Nose normal.  Mouth/Throat: Oropharynx is clear and moist. No oropharyngeal exudate.  Eyes: Pupils are equal, round, and reactive to light. Conjunctivae and EOM are normal. No scleral icterus.  Neck: Normal range of motion. Neck supple.  Cardiovascular: Normal rate, regular rhythm, normal heart sounds and intact distal pulses.  No murmur heard. Pulmonary/Chest: Effort normal and breath sounds normal. No respiratory distress. He has no wheezes. He has no rales.  Musculoskeletal: He exhibits no edema.  See HPI for foot exam if done  Lymphadenopathy:    He has no cervical adenopathy.  Skin: Skin is warm and dry. No rash noted.  Psychiatric: He has a normal mood and affect.  Nursing note and vitals reviewed.  Results for orders placed or performed in visit on 11/10/17  CBC with Differential  Result Value Ref Range   WBC 2.9 (L) 3.8 - 10.6 K/uL   RBC 2.31 (L) 4.40 - 5.90 MIL/uL   Hemoglobin 8.8 (L) 13.0 - 18.0 g/dL   HCT 25.2 (L) 40.0 - 52.0 %   MCV 109.4 (H) 80.0 - 100.0 fL   MCH 38.3 (H) 26.0 - 34.0 pg   MCHC 35.1 32.0 - 36.0 g/dL   RDW 15.1 (H) 11.5 - 14.5 %   Platelets 80 (L) 150 - 440 K/uL   Neutrophils Relative % 62 %   Neutro Abs 1.8 1.4 - 6.5 K/uL   Lymphocytes Relative 24 %   Lymphs Abs 0.7 (L) 1.0 - 3.6 K/uL   Monocytes Relative 10 %   Monocytes Absolute 0.3 0.2 - 1.0 K/uL    Eosinophils Relative 3 %   Eosinophils Absolute 0.1 0 - 0.7 K/uL   Basophils Relative 1 %   Basophils Absolute 0.0 0 - 0.1 K/uL      Assessment & Plan:   Problem List Items Addressed This Visit    CKD stage 3 due to type 2 diabetes mellitus (HCC)    Baseline Cr seems to be 1.2-1.4.  Update renal  panel today. Consider taper off hctz.       Relevant Orders   Hemoglobin A1c   Essential hypertension    Chronic, deteriorated over last several office notes in chart. Endorses ongoing BPH sxs - will start cardura 1mg  daily - sent to pharmacy. Continue lisinopril and hctz.       Relevant Medications   doxazosin (CARDURA) 1 MG tablet   Type 2 diabetes, controlled, with retinopathy ([place]) - Primary    Chronic, elevated readings on blood work and recall cbg's however fructosamine/A1c stable. Anticipate fructosamine is a more accurate reflection of his glycemic control - update today.       Relevant Orders   Hemoglobin A1c   Renal function panel   Fructosamine   Viral URI    Anticipate viral given short duration. Supportive care reviewed. Declines INS. rec nasal saline.           Meds ordered this encounter  Medications  . doxazosin (CARDURA) 1 MG tablet    Sig: Take 1 tablet (1 mg total) by mouth daily.    Dispense:  30 tablet    Refill:  6   Orders Placed This Encounter  Procedures  . Hemoglobin A1c  . Renal function panel  . Fructosamine    Follow up plan: Return in about 3 months (around 02/14/2018) for follow up visit.  Ria Bush, MD

## 2017-11-14 NOTE — Assessment & Plan Note (Signed)
Chronic, deteriorated over last several office notes in chart. Endorses ongoing BPH sxs - will start cardura 1mg  daily - sent to pharmacy. Continue lisinopril and hctz.

## 2017-11-17 LAB — RENAL FUNCTION PANEL
ALBUMIN MSPROF: 3.9 g/dL (ref 3.6–5.1)
BUN/Creatinine Ratio: 18 (calc) (ref 6–22)
BUN: 26 mg/dL — AB (ref 7–25)
CO2: 25 mmol/L (ref 20–32)
CREATININE: 1.45 mg/dL — AB (ref 0.70–1.11)
Calcium: 8.8 mg/dL (ref 8.6–10.3)
Chloride: 105 mmol/L (ref 98–110)
Glucose, Bld: 275 mg/dL — ABNORMAL HIGH (ref 65–99)
POTASSIUM: 4.1 mmol/L (ref 3.5–5.3)
Phosphorus: 2.5 mg/dL (ref 2.1–4.3)
Sodium: 138 mmol/L (ref 135–146)

## 2017-11-17 LAB — HEMOGLOBIN A1C
EAG (MMOL/L): 7.9 (calc)
Hgb A1c MFr Bld: 6.6 % of total Hgb — ABNORMAL HIGH (ref <5.7)
Mean Plasma Glucose: 143 (calc)

## 2017-11-17 LAB — FRUCTOSAMINE: FRUCTOSAMINE: 278 umol/L — AB (ref 190–270)

## 2017-11-24 ENCOUNTER — Inpatient Hospital Stay: Payer: Medicare Other | Attending: Hematology and Oncology

## 2017-11-24 ENCOUNTER — Other Ambulatory Visit: Payer: Self-pay | Admitting: Urgent Care

## 2017-11-24 DIAGNOSIS — D61810 Antineoplastic chemotherapy induced pancytopenia: Secondary | ICD-10-CM

## 2017-11-24 DIAGNOSIS — D46Z Other myelodysplastic syndromes: Secondary | ICD-10-CM

## 2017-11-24 DIAGNOSIS — D539 Nutritional anemia, unspecified: Secondary | ICD-10-CM

## 2017-11-24 DIAGNOSIS — T451X5A Adverse effect of antineoplastic and immunosuppressive drugs, initial encounter: Secondary | ICD-10-CM

## 2017-11-24 DIAGNOSIS — D469 Myelodysplastic syndrome, unspecified: Secondary | ICD-10-CM | POA: Insufficient documentation

## 2017-11-24 LAB — CBC WITH DIFFERENTIAL/PLATELET
Basophils Absolute: 0 10*3/uL (ref 0–0.1)
Basophils Relative: 1 %
Eosinophils Absolute: 0.1 10*3/uL (ref 0–0.7)
Eosinophils Relative: 2 %
HCT: 20.9 % — ABNORMAL LOW (ref 40.0–52.0)
Hemoglobin: 7.4 g/dL — ABNORMAL LOW (ref 13.0–18.0)
Lymphocytes Relative: 16 %
Lymphs Abs: 0.5 10*3/uL — ABNORMAL LOW (ref 1.0–3.6)
MCH: 38 pg — ABNORMAL HIGH (ref 26.0–34.0)
MCHC: 35.2 g/dL (ref 32.0–36.0)
MCV: 107.9 fL — ABNORMAL HIGH (ref 80.0–100.0)
Monocytes Absolute: 0.2 10*3/uL (ref 0.2–1.0)
Monocytes Relative: 7 %
Neutro Abs: 2.5 10*3/uL (ref 1.4–6.5)
Neutrophils Relative %: 74 %
Platelets: 81 10*3/uL — ABNORMAL LOW (ref 150–440)
RBC: 1.94 MIL/uL — ABNORMAL LOW (ref 4.40–5.90)
RDW: 14.4 % (ref 11.5–14.5)
WBC: 3.4 10*3/uL — ABNORMAL LOW (ref 3.8–10.6)

## 2017-11-24 MED ORDER — HEPARIN SOD (PORK) LOCK FLUSH 100 UNIT/ML IV SOLN
500.0000 [IU] | Freq: Once | INTRAVENOUS | Status: AC
  Administered 2017-11-24: 500 [IU] via INTRAVENOUS

## 2017-11-24 MED ORDER — SODIUM CHLORIDE 0.9% FLUSH
10.0000 mL | Freq: Once | INTRAVENOUS | Status: AC
  Administered 2017-11-24: 10 mL via INTRAVENOUS
  Filled 2017-11-24: qty 10

## 2017-11-25 ENCOUNTER — Inpatient Hospital Stay: Payer: Medicare Other

## 2017-11-25 DIAGNOSIS — D469 Myelodysplastic syndrome, unspecified: Secondary | ICD-10-CM | POA: Diagnosis not present

## 2017-11-25 DIAGNOSIS — T451X5A Adverse effect of antineoplastic and immunosuppressive drugs, initial encounter: Secondary | ICD-10-CM

## 2017-11-25 DIAGNOSIS — D46Z Other myelodysplastic syndromes: Secondary | ICD-10-CM

## 2017-11-25 DIAGNOSIS — D61810 Antineoplastic chemotherapy induced pancytopenia: Secondary | ICD-10-CM

## 2017-11-25 DIAGNOSIS — D539 Nutritional anemia, unspecified: Secondary | ICD-10-CM

## 2017-11-25 LAB — PREPARE RBC (CROSSMATCH)

## 2017-11-25 MED ORDER — DIPHENHYDRAMINE HCL 25 MG PO CAPS
25.0000 mg | ORAL_CAPSULE | Freq: Once | ORAL | Status: AC
  Administered 2017-11-25: 25 mg via ORAL
  Filled 2017-11-25: qty 1

## 2017-11-25 MED ORDER — ACETAMINOPHEN 325 MG PO TABS
650.0000 mg | ORAL_TABLET | Freq: Once | ORAL | Status: AC
  Administered 2017-11-25: 650 mg via ORAL
  Filled 2017-11-25: qty 2

## 2017-11-25 MED ORDER — HEPARIN SOD (PORK) LOCK FLUSH 100 UNIT/ML IV SOLN
500.0000 [IU] | Freq: Once | INTRAVENOUS | Status: AC
  Administered 2017-11-25: 500 [IU] via INTRAVENOUS
  Filled 2017-11-25: qty 5

## 2017-11-25 MED ORDER — SODIUM CHLORIDE 0.9 % IV SOLN
250.0000 mL | Freq: Once | INTRAVENOUS | Status: AC
  Administered 2017-11-25: 250 mL via INTRAVENOUS
  Filled 2017-11-25: qty 250

## 2017-11-26 LAB — TYPE AND SCREEN
ABO/RH(D): A NEG
Antibody Screen: NEGATIVE
Unit division: 0

## 2017-11-26 LAB — BPAM RBC
BLOOD PRODUCT EXPIRATION DATE: 201904091943
ISSUE DATE / TIME: 201904091204
UNIT TYPE AND RH: 600

## 2017-12-01 ENCOUNTER — Other Ambulatory Visit: Admit: 2017-12-01 | Discharge: 2017-12-02 | Payer: MEDICARE

## 2017-12-01 ENCOUNTER — Ambulatory Visit: Admit: 2017-12-01 | Discharge: 2017-12-02 | Payer: MEDICARE | Attending: Internal Medicine | Primary: Internal Medicine

## 2017-12-01 DIAGNOSIS — D469 Myelodysplastic syndrome, unspecified: Secondary | ICD-10-CM

## 2017-12-01 DIAGNOSIS — D696 Thrombocytopenia, unspecified: Secondary | ICD-10-CM

## 2017-12-01 DIAGNOSIS — C9000 Multiple myeloma not having achieved remission: Principal | ICD-10-CM

## 2017-12-01 DIAGNOSIS — C9141 Hairy cell leukemia, in remission: Secondary | ICD-10-CM

## 2017-12-01 DIAGNOSIS — E1122 Type 2 diabetes mellitus with diabetic chronic kidney disease: Secondary | ICD-10-CM | POA: Diagnosis not present

## 2017-12-08 ENCOUNTER — Inpatient Hospital Stay: Payer: Medicare Other

## 2017-12-08 ENCOUNTER — Other Ambulatory Visit: Payer: Self-pay

## 2017-12-08 DIAGNOSIS — D539 Nutritional anemia, unspecified: Secondary | ICD-10-CM

## 2017-12-08 DIAGNOSIS — C9141 Hairy cell leukemia, in remission: Secondary | ICD-10-CM

## 2017-12-08 DIAGNOSIS — D469 Myelodysplastic syndrome, unspecified: Secondary | ICD-10-CM | POA: Diagnosis not present

## 2017-12-08 LAB — CBC WITH DIFFERENTIAL/PLATELET
Basophils Absolute: 0 10*3/uL (ref 0–0.1)
Basophils Relative: 1 %
Eosinophils Absolute: 0.1 10*3/uL (ref 0–0.7)
Eosinophils Relative: 3 %
HCT: 25.2 % — ABNORMAL LOW (ref 40.0–52.0)
Hemoglobin: 8.7 g/dL — ABNORMAL LOW (ref 13.0–18.0)
Lymphocytes Relative: 24 %
Lymphs Abs: 0.6 10*3/uL — ABNORMAL LOW (ref 1.0–3.6)
MCH: 36.8 pg — ABNORMAL HIGH (ref 26.0–34.0)
MCHC: 34.7 g/dL (ref 32.0–36.0)
MCV: 106.1 fL — ABNORMAL HIGH (ref 80.0–100.0)
Monocytes Absolute: 0.2 10*3/uL (ref 0.2–1.0)
Monocytes Relative: 6 %
Neutro Abs: 1.6 10*3/uL (ref 1.4–6.5)
Neutrophils Relative %: 66 %
Platelets: 92 10*3/uL — ABNORMAL LOW (ref 150–440)
RBC: 2.37 MIL/uL — ABNORMAL LOW (ref 4.40–5.90)
RDW: 17.3 % — ABNORMAL HIGH (ref 11.5–14.5)
WBC: 2.5 10*3/uL — ABNORMAL LOW (ref 3.8–10.6)

## 2017-12-08 LAB — SAMPLE TO BLOOD BANK

## 2017-12-09 ENCOUNTER — Other Ambulatory Visit: Payer: Self-pay | Admitting: Urgent Care

## 2017-12-09 ENCOUNTER — Telehealth: Payer: Self-pay | Admitting: *Deleted

## 2017-12-09 DIAGNOSIS — D61810 Antineoplastic chemotherapy induced pancytopenia: Secondary | ICD-10-CM

## 2017-12-09 DIAGNOSIS — T451X5A Adverse effect of antineoplastic and immunosuppressive drugs, initial encounter: Secondary | ICD-10-CM

## 2017-12-09 DIAGNOSIS — D4620 Refractory anemia with excess of blasts, unspecified: Secondary | ICD-10-CM

## 2017-12-09 DIAGNOSIS — D701 Agranulocytosis secondary to cancer chemotherapy: Secondary | ICD-10-CM

## 2017-12-09 DIAGNOSIS — N183 Chronic kidney disease, stage 3 (moderate): Secondary | ICD-10-CM

## 2017-12-09 DIAGNOSIS — E1122 Type 2 diabetes mellitus with diabetic chronic kidney disease: Secondary | ICD-10-CM

## 2017-12-09 NOTE — Telephone Encounter (Signed)
Called patient to inform him that he has been scheduled for one unit pRBC's for Thursday, April 25 @ 8:30 am.  Patient will need to come for lab @ 8:00 am.

## 2017-12-11 ENCOUNTER — Other Ambulatory Visit: Payer: Self-pay | Admitting: Urgent Care

## 2017-12-11 ENCOUNTER — Inpatient Hospital Stay: Payer: Medicare Other

## 2017-12-11 ENCOUNTER — Other Ambulatory Visit: Payer: Self-pay | Admitting: Family Medicine

## 2017-12-11 DIAGNOSIS — D61810 Antineoplastic chemotherapy induced pancytopenia: Secondary | ICD-10-CM

## 2017-12-11 DIAGNOSIS — E1122 Type 2 diabetes mellitus with diabetic chronic kidney disease: Secondary | ICD-10-CM

## 2017-12-11 DIAGNOSIS — N183 Chronic kidney disease, stage 3 unspecified: Secondary | ICD-10-CM

## 2017-12-11 DIAGNOSIS — T451X5A Adverse effect of antineoplastic and immunosuppressive drugs, initial encounter: Secondary | ICD-10-CM

## 2017-12-11 DIAGNOSIS — D469 Myelodysplastic syndrome, unspecified: Secondary | ICD-10-CM | POA: Diagnosis not present

## 2017-12-11 DIAGNOSIS — D4620 Refractory anemia with excess of blasts, unspecified: Secondary | ICD-10-CM

## 2017-12-11 DIAGNOSIS — C9141 Hairy cell leukemia, in remission: Secondary | ICD-10-CM

## 2017-12-11 LAB — CBC WITH DIFFERENTIAL/PLATELET
Basophils Absolute: 0 10*3/uL (ref 0–0.1)
Basophils Relative: 1 %
Eosinophils Absolute: 0.1 10*3/uL (ref 0–0.7)
Eosinophils Relative: 3 %
HCT: 24.7 % — ABNORMAL LOW (ref 40.0–52.0)
Hemoglobin: 8.6 g/dL — ABNORMAL LOW (ref 13.0–18.0)
Lymphocytes Relative: 23 %
Lymphs Abs: 0.6 10*3/uL — ABNORMAL LOW (ref 1.0–3.6)
MCH: 37 pg — ABNORMAL HIGH (ref 26.0–34.0)
MCHC: 34.7 g/dL (ref 32.0–36.0)
MCV: 106.7 fL — ABNORMAL HIGH (ref 80.0–100.0)
Monocytes Absolute: 0.2 10*3/uL (ref 0.2–1.0)
Monocytes Relative: 9 %
Neutro Abs: 1.6 10*3/uL (ref 1.4–6.5)
Neutrophils Relative %: 64 %
Platelets: 88 10*3/uL — ABNORMAL LOW (ref 150–440)
RBC: 2.32 MIL/uL — ABNORMAL LOW (ref 4.40–5.90)
RDW: 16.8 % — ABNORMAL HIGH (ref 11.5–14.5)
WBC: 2.5 10*3/uL — ABNORMAL LOW (ref 3.8–10.6)

## 2017-12-11 MED ORDER — SODIUM CHLORIDE 0.9 % IV SOLN
250.0000 mL | Freq: Once | INTRAVENOUS | Status: DC
  Filled 2017-12-11: qty 250

## 2017-12-11 MED ORDER — HEPARIN SOD (PORK) LOCK FLUSH 100 UNIT/ML IV SOLN
500.0000 [IU] | Freq: Once | INTRAVENOUS | Status: AC
  Administered 2017-12-11: 500 [IU] via INTRAVENOUS
  Filled 2017-12-11: qty 5

## 2017-12-11 MED ORDER — DIPHENHYDRAMINE HCL 25 MG PO CAPS: 25.0000 mg | ORAL_CAPSULE | Freq: Once | ORAL | Status: DC

## 2017-12-11 MED ORDER — SODIUM CHLORIDE 0.9% FLUSH
10.0000 mL | INTRAVENOUS | Status: DC | PRN
  Filled 2017-12-11: qty 10

## 2017-12-11 MED ORDER — ACETAMINOPHEN 325 MG PO TABS: 650.0000 mg | ORAL_TABLET | Freq: Once | ORAL | Status: DC

## 2017-12-11 NOTE — Addendum Note (Signed)
Addended by: Honor Loh on: 12/11/2017 12:09 PM   Modules accepted: Orders, SmartSet

## 2017-12-11 NOTE — Progress Notes (Signed)
Unable to transfuse blood today due to time restriction and blood taking longer than usual to get prepared. Patient to return to clinic on 4/26 at [facility] patient to keep blood band on. Patient verbalized understanding.

## 2017-12-12 ENCOUNTER — Inpatient Hospital Stay: Payer: Medicare Other

## 2017-12-12 DIAGNOSIS — E1122 Type 2 diabetes mellitus with diabetic chronic kidney disease: Secondary | ICD-10-CM

## 2017-12-12 DIAGNOSIS — D469 Myelodysplastic syndrome, unspecified: Secondary | ICD-10-CM | POA: Diagnosis not present

## 2017-12-12 DIAGNOSIS — T451X5A Adverse effect of antineoplastic and immunosuppressive drugs, initial encounter: Secondary | ICD-10-CM

## 2017-12-12 DIAGNOSIS — D61810 Antineoplastic chemotherapy induced pancytopenia: Secondary | ICD-10-CM

## 2017-12-12 DIAGNOSIS — D4620 Refractory anemia with excess of blasts, unspecified: Secondary | ICD-10-CM

## 2017-12-12 DIAGNOSIS — N183 Chronic kidney disease, stage 3 unspecified: Secondary | ICD-10-CM

## 2017-12-12 LAB — PREPARE RBC (CROSSMATCH)

## 2017-12-12 MED ORDER — SODIUM CHLORIDE 0.9 % IV SOLN
250.0000 mL | Freq: Once | INTRAVENOUS | Status: AC
  Administered 2017-12-12: 250 mL via INTRAVENOUS
  Filled 2017-12-12: qty 250

## 2017-12-12 MED ORDER — ACETAMINOPHEN 325 MG PO TABS
650.0000 mg | ORAL_TABLET | Freq: Once | ORAL | Status: AC
  Administered 2017-12-12: 650 mg via ORAL
  Filled 2017-12-12: qty 2

## 2017-12-12 MED ORDER — HEPARIN SOD (PORK) LOCK FLUSH 100 UNIT/ML IV SOLN
500.0000 [IU] | Freq: Once | INTRAVENOUS | Status: AC
  Administered 2017-12-12: 500 [IU] via INTRAVENOUS

## 2017-12-12 MED ORDER — DIPHENHYDRAMINE HCL 25 MG PO CAPS
25.0000 mg | ORAL_CAPSULE | Freq: Once | ORAL | Status: AC
  Administered 2017-12-12: 25 mg via ORAL
  Filled 2017-12-12: qty 1

## 2017-12-13 LAB — BPAM RBC
Blood Product Expiration Date: 201904261830
ISSUE DATE / TIME: 201904260931
Unit Type and Rh: 600

## 2017-12-13 LAB — TYPE AND SCREEN
ABO/RH(D): A NEG
Antibody Screen: NEGATIVE
Unit division: 0

## 2017-12-22 ENCOUNTER — Inpatient Hospital Stay: Payer: Medicare Other | Attending: Hematology and Oncology

## 2017-12-22 ENCOUNTER — Other Ambulatory Visit: Payer: Self-pay

## 2017-12-22 DIAGNOSIS — D539 Nutritional anemia, unspecified: Secondary | ICD-10-CM

## 2017-12-22 DIAGNOSIS — C9000 Multiple myeloma not having achieved remission: Secondary | ICD-10-CM | POA: Insufficient documentation

## 2017-12-22 DIAGNOSIS — C9141 Hairy cell leukemia, in remission: Secondary | ICD-10-CM

## 2017-12-22 LAB — CBC WITH DIFFERENTIAL/PLATELET
Basophils Absolute: 0 10*3/uL (ref 0–0.1)
Basophils Relative: 1 %
Eosinophils Absolute: 0.1 10*3/uL (ref 0–0.7)
Eosinophils Relative: 3 %
HCT: 27.7 % — ABNORMAL LOW (ref 40.0–52.0)
Hemoglobin: 9.8 g/dL — ABNORMAL LOW (ref 13.0–18.0)
Lymphocytes Relative: 21 %
Lymphs Abs: 0.6 10*3/uL — ABNORMAL LOW (ref 1.0–3.6)
MCH: 36.3 pg — ABNORMAL HIGH (ref 26.0–34.0)
MCHC: 35.5 g/dL (ref 32.0–36.0)
MCV: 102.1 fL — ABNORMAL HIGH (ref 80.0–100.0)
Monocytes Absolute: 0.2 10*3/uL (ref 0.2–1.0)
Monocytes Relative: 7 %
Neutro Abs: 2 10*3/uL (ref 1.4–6.5)
Neutrophils Relative %: 68 %
Platelets: 91 10*3/uL — ABNORMAL LOW (ref 150–440)
RBC: 2.71 MIL/uL — ABNORMAL LOW (ref 4.40–5.90)
RDW: 19.1 % — ABNORMAL HIGH (ref 11.5–14.5)
WBC: 2.9 10*3/uL — ABNORMAL LOW (ref 3.8–10.6)

## 2017-12-22 LAB — SAMPLE TO BLOOD BANK

## 2017-12-30 ENCOUNTER — Other Ambulatory Visit: Payer: Self-pay | Admitting: Family Medicine

## 2017-12-30 NOTE — Telephone Encounter (Signed)
Sent refills

## 2018-01-05 ENCOUNTER — Other Ambulatory Visit: Payer: Medicare Other

## 2018-01-05 ENCOUNTER — Ambulatory Visit: Payer: Medicare Other | Admitting: Hematology and Oncology

## 2018-01-09 ENCOUNTER — Other Ambulatory Visit: Payer: Medicare Other

## 2018-01-09 ENCOUNTER — Ambulatory Visit: Payer: Medicare Other | Admitting: Hematology and Oncology

## 2018-01-12 ENCOUNTER — Other Ambulatory Visit: Payer: Self-pay | Admitting: Family Medicine

## 2018-01-15 ENCOUNTER — Other Ambulatory Visit: Payer: Self-pay | Admitting: Hematology and Oncology

## 2018-01-15 ENCOUNTER — Inpatient Hospital Stay: Payer: Medicare Other

## 2018-01-15 ENCOUNTER — Inpatient Hospital Stay: Payer: Medicare Other | Admitting: Hematology and Oncology

## 2018-01-15 DIAGNOSIS — E538 Deficiency of other specified B group vitamins: Secondary | ICD-10-CM

## 2018-01-15 DIAGNOSIS — C9000 Multiple myeloma not having achieved remission: Secondary | ICD-10-CM

## 2018-01-15 NOTE — Progress Notes (Deleted)
[facility] day:  01/15/2018    Chief Complaint: Alex Smith is a 83 y.o. male with a myelodysplastic marrow, smoldering myeloma, and a history of hairy cell leukemia who is seen for 3 month assessment.  HPI:  The patient was last seen in the medical oncology clinic on 10/13/2017.  At that time, he felt "fair".  Exam was stable.  Counts included a hematocrit 26.4, hemoglobin of 9.2, platelets 85,000, and WBC 2800 with ANC 1800.  He saw Dr [name] on 12/01/2017.  His smoldering myeloma appeared stable and there was no evidence of hairy cell leukemia. While there was no official myelodysplastic syndrome, there was not a better explanation for his anemia .  Other cell counts were better.  Surveillance was recommended.  Recommendations were tansfusions to maintain a hemoglobin >9g/dL.  Iff hemoglobin worsens or paraprotein increases, would consider Darzalex chemotherapy for multiple myeloma.  She would consider Luspatercept when it becomes available for anemia in low-risk MDS  He is scheduled for follow-up in 6 months.  He received PRBCs on 12/12/2017 for a hemoglobin of 8.6.  During the interim,   Past Medical History:  Diagnosis Date  . Anemia   . B12 deficiency   . Basal cell carcinoma of face   . BPH (benign prostatic hypertrophy)    followed by urology, discharged (Dr. Bernardo Heater)  . CAP (community acquired pneumonia) 02/15/2015  . CKD stage 3 due to type 2 diabetes mellitus ([place]) 11/14/2017  . Colon polyps   . Diverticulosis   . GERD (gastroesophageal reflux disease)   . 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 History of pneumonia 2000's   "once" (07/07/2012)  . History of shingles   . HLD (hyperlipidemia)   . Hypertension   . Pneumonia   . Shortness of breath dyspnea   . Systolic murmur 73/42/8768  . Type 2 diabetes, controlled, with retinopathy ([place])     Past Surgical  History:  Procedure Laterality Date  . [place] VITRECTOMY WITH 20 GAUGE MVR PORT FOR MACULAR HOLE  07/07/2012   Procedure: 25 GAUGE PARS PLANA VITRECTOMY WITH 20 GAUGE MVR PORT FOR MACULAR HOLE;  Surgeon: Hayden Pedro, MD;  Location: [place] [facility];  Service: Ophthalmology;  Laterality: Left;  . BONE MARROW BIOPSY  2016  . CARDIOVASCULAR STRESS TEST  2013   treadmill - no evidence ischemia, EF 61%  . CATARACT EXTRACTION W/ INTRAOCULAR LENS  IMPLANT, BILATERAL  ~ 2010  . COLONOSCOPY  2014   [place] WNL no rpt needed, h/o polyps  . EYE SURGERY Left 06/2012   laser surgery  . GAS INSERTION  07/07/2012   Procedure: INSERTION OF GAS;  Surgeon: Hayden Pedro, MD;  Location: [facility];  Service: Ophthalmology;  Laterality: Left;  [id number]  . PERIPHERAL VASCULAR CATHETERIZATION N/A 02/23/2015   Procedure: Glori Luis Cath Insertion;  Surgeon: Algernon Huxley, MD;  Location: [facility];  Service: Cardiovascular;  Laterality: N/A;  . SERUM PATCH  07/07/2012   Procedure: SERUM PATCH;  Surgeon: Hayden Pedro, MD;  Location: [facility];  Service: Ophthalmology;  Laterality: Left;  . SKIN CANCER EXCISION     "all over my face" (07/07/2012)    Family History  Problem Relation Age of Onset  . Dementia Mother   . Heart failure Father 72  . Cancer Sister        breast  .  Diabetes Paternal Uncle   . Diabetes Paternal Aunt   . CAD Brother 45       MI  . Stroke Neg Hx     Social History:  reports that he quit smoking about 48 years ago. His smoking use included cigarettes. He has a 50.00 pack-year smoking history. He has never used smokeless tobacco. He reports that he drinks alcohol. He reports that he does not use drugs.  He works at [employer] 20-32 hours/week.  The patient 's wife has had 3 hip operations.  She underwent hip replacement 03/06/2016.  His wife is in [place].  He states that they either eat out or bring food in.  His best contact number is his cell: [phone]. Patient has a daughter  [name]). He is alone today.  Allergies:  Allergies  Allergen Reactions  . Rituximab Rash    Chest tightness Chest tightness  . Blood-Group Specific Substance Other (See Comments)    Had a post transfusion reaction of red blood cells; NOW REQUIRES WASHED BLOOD CELLS Had a post transfusion reaction of red blood cells; NOW REQUIRES WASHED BLOOD CELLS  . Primaxin [Imipenem] Other (See Comments)    Possible allergy Possible allergy  . Voriconazole Other (See Comments)  . Sulfa Antibiotics Itching and Rash  . Sulfacetamide Sodium Itching and Rash  Possible allergy to Zoloft, allopurinol, and a chemotherapy medication.  He tolerates Dapsone.  Current Medications: Current Outpatient Medications  Medication Sig Dispense Refill  . ACCU-CHEK AVIVA PLUS test strip USE TO CHECK SUGAR TWICE DAILY 200 each 0  . acyclovir (ZOVIRAX) 400 MG tablet TAKE TWO TABLETS BY MOUTH TWICE DAILY 120 tablet 3  . aspirin EC 81 MG tablet Take 81 mg by mouth daily.    Marland Kitchen doxazosin (CARDURA) 1 MG tablet Take 1 tablet (1 mg total) by mouth daily. 30 tablet 6  . HUMULIN N 100 UNIT/ML injection INJECT 39 UNITS SUBCUTANEOUSLY TWICE DAILY BEFORE A MEAL 20 mL 6  . hydrochlorothiazide (HYDRODIURIL) 25 MG tablet TAKE ONE TABLET EVERY DAY 30 tablet 1  . insulin regular (HUMULIN R) 100 units/mL injection Inject 0.1 mLs (10 Units total) into the skin 2 (two) times daily before a meal. (33 units daily) 10 mL 5  . lidocaine-prilocaine (EMLA) cream Apply 1 application topically as needed. 30 g 3  . lisinopril (PRINIVIL,ZESTRIL) 20 MG tablet TAKE ONE TABLET TWICE DAILY 60 tablet 1  . metFORMIN (GLUCOPHAGE) 1000 MG tablet TAKE ONE TABLET TWICE DAILY 180 tablet 1  . MICROLET LANCETS MISC USE AS DIRECTED 200 each 3  . pravastatin (PRAVACHOL) 20 MG tablet TAKE ONE TABLET BY MOUTH AT BEDTIME 30 tablet 2  . ranitidine (ZANTAC) 150 MG tablet TAKE ONE TABLET BY MOUTH TWICE DAILY AS NEEDED 180 tablet 3  . tamsulosin (FLOMAX) 0.4 MG CAPS  capsule TAKE 1 CAPSULE BY MOUTH EVERY DAY 30 capsule 3  . temazepam (RESTORIL) 7.5 MG capsule Take 7.5 mg by mouth once.     . traZODone (DESYREL) 50 MG tablet TAKE 1/2 TO 1 TABLET BY MOUTH AT BEDTIMEAS NEEDED FOR SLEEP 30 tablet 0  . vitamin E 400 UNIT capsule Take 200 Units by mouth daily.      Current Facility-Administered Medications  Medication Dose Route Frequency Provider Last Rate Last Dose  . Tbo-Filgrastim (GRANIX) injection 480 mcg  480 mcg Subcutaneous Once Lequita Asal, MD       Facility-Administered Medications Ordered in Other Visits  Medication Dose Route Frequency Provider Last Rate Last  Dose  . heparin lock flush 100 unit/mL  500 Units Intravenous Once [place], Melissa C, MD      . sodium chloride 0.9 % injection 10 mL  10 mL Intravenous PRN Lequita Asal, MD   10 mL at 03/03/15 0903  . sodium chloride 0.9 % injection 10 mL  10 mL Intracatheter PRN Lequita Asal, MD   10 mL at 03/10/15 1410   Review of Systems:  GENERAL:  Feels "fait to [place]".  Some fatigue.  No fevers or sweats.  Weight up 4 pounds.  PERFORMANCE STATUS (ECOG):  1 HEENT: No visual changes, runny nose, sore throat. Pulmonary:  Shortness of breath on exertion.  No cough.  No hemoptysis. Cardiac:  No chest pain, palpitations, orthopnea, or PND.  Heart murmur. GI:  Appetite good.  No nausea, vomiting, diarrhea, constipation, melena or hematochezia.  GU:  No urgency, frequency, dysuria, or hematuria.  On Flomax. Musculoskeletal:  No back pain.  No joint pain.  No muscle tenderness. Extremities:  No pain or swelling. Skin:  No rashes or skin changes. Neuro:  No headache, numbness or weakness, balance or coordination issues. Endocrine:  Diabetes.  No thyroid issues, hot flashes or night sweats. Psych:  No mood changes, depression or anxiety.  Pain:  No focal pain. Review of systems:  All other systems reviewed and found to be negative.   GENERAL:  Feels good.  Active.  No fevers,  sweats or weight loss. PERFORMANCE STATUS (ECOG):  *** HEENT:  No visual changes, runny nose, sore throat, mouth sores or tenderness. Lungs: No shortness of breath or cough.  No hemoptysis. Cardiac:  No chest pain, palpitations, orthopnea, or PND. GI:  No nausea, vomiting, diarrhea, constipation, melena or hematochezia. GU:  No urgency, frequency, dysuria, or hematuria. Musculoskeletal:  No back pain.  No joint pain.  No muscle tenderness. Extremities:  No pain or swelling. Skin:  No rashes or skin changes. Neuro:  No headache, numbness or weakness, balance or coordination issues. Endocrine:  No diabetes, thyroid issues, hot flashes or night sweats. Psych:  No mood changes, depression or anxiety. Pain:  No focal pain. Review of systems:  All other systems reviewed and found to be negative.   Physical Exam: There were no vitals taken for this visit.  GENERAL:  Well developed, well nourished, gentleman sitting comfortably in the exam room in no acute distress. MENTAL STATUS:  Alert and oriented to person, place and time. HEAD:  White hair.  Normocephalic, atraumatic, face symmetric, no Cushingoid features. EYES:  Glasses. Blue eyes.  Pupils equal round and reactive to light and accomodation.  No conjunctivitis or scleral icterus. ENT:  Oropharynx clear without lesion.  Tongue normal. Mucous membranes moist.  RESPIRATORY:  Clear to auscultation without rales, wheezes or rhonchi. CARDIOVASCULAR:  Grade III systolic murmur (stable). Regular rate and rhythm without, rub or gallop. ABDOMEN:  Soft, non-tender, with active bowel sounds, and no hepatosplenomegaly.  No masses. SKIN:  No rashes or ulcers. EXTREMITIES: Bilateral lower extremity edema (L>R).  No skin discoloration or tenderness.  No palpable cords. LYMPH NODES: No palpable cervical, supraclavicular, axillary or inguinal adenopathy  NEUROLOGICAL: Unremarkable. PSYCH:  Appropriate.   GENERAL:  Well developed, well nourished,  gentleman sitting comfortably in the exam room in no acute distress. MENTAL STATUS:  Alert and oriented to person, place and time. HEAD:  *** hair.  Normocephalic, atraumatic, face symmetric, no Cushingoid features. EYES:  *** eyes.  Pupils equal round and reactive to light  and accomodation.  No conjunctivitis or scleral icterus. ENT:  Oropharynx clear without lesion.  Tongue normal. Mucous membranes moist.  RESPIRATORY:  Clear to auscultation without rales, wheezes or rhonchi. CARDIOVASCULAR:  Regular rate and rhythm without murmur, rub or gallop. ABDOMEN:  Soft, non-tender, with active bowel sounds, and no hepatosplenomegaly.  No masses. SKIN:  No rashes, ulcers or lesions. EXTREMITIES: No edema, no skin discoloration or tenderness.  No palpable cords. LYMPH NODES: No palpable cervical, supraclavicular, axillary or inguinal adenopathy  NEUROLOGICAL: Unremarkable. PSYCH:  Appropriate.    No visits with results within 3 Day(s) from this visit.  Latest known visit with results is:  Orders Only on 12/22/2017  Component Date Value Ref Range Status  . WBC 12/22/2017 2.9* 3.8 - 10.6 K/uL Final  . RBC 12/22/2017 2.71* 4.40 - 5.90 MIL/uL Final  . Hemoglobin 12/22/2017 9.8* 13.0 - 18.0 g/dL Final  . HCT 12/22/2017 27.7* 40.0 - 52.0 % Final  . MCV 12/22/2017 102.1* 80.0 - 100.0 fL Final  . MCH 12/22/2017 36.3* 26.0 - 34.0 pg Final  . MCHC 12/22/2017 35.5  32.0 - 36.0 g/dL Final  . RDW 12/22/2017 19.1* 11.5 - 14.5 % Final  . Platelets 12/22/2017 91* 150 - 440 K/uL Final  . Neutrophils Relative % 12/22/2017 68  % Final  . Neutro Abs 12/22/2017 2.0  1.4 - 6.5 K/uL Final  . Lymphocytes Relative 12/22/2017 21  % Final  . Lymphs Abs 12/22/2017 0.6* 1.0 - 3.6 K/uL Final  . Monocytes Relative 12/22/2017 7  % Final  . Monocytes Absolute 12/22/2017 0.2  0.2 - 1.0 K/uL Final  . Eosinophils Relative 12/22/2017 3  % Final  . Eosinophils Absolute 12/22/2017 0.1  0 - 0.7 K/uL Final  . Basophils Relative  12/22/2017 1  % Final  . Basophils Absolute 12/22/2017 0.0  0 - 0.1 K/uL Final   Performed at [facility], [address] [place], [place] [address]  . Blood Bank Specimen 12/22/2017 SAMPLE AVAILABLE FOR TESTING   Final  . Sample Expiration 12/22/2017    Final                   Value:12/25/2017 Performed at [facility], [address] [place], [place] [address]     Assessment:  Alex Smith is a 83 y.o. 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  He was diagnosed with hairy cell leukemia in 2006.  He received cladribine 11/06-11/05/2005.  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   Bone marrow aspirate and biopsy on 02/07/2015 revealed a extensive marrow involvement by recurrent hairy cell leukemia (approximately 80% of cells in the core). There was a monoclonal plasma cell infiltrate (approximately 10%), compatible with a plasma cell neoplasm. Marrow was hypercellular (40-50%) with markedly diminished residual trilineage hematopoiesis and mild multilineage dyspoiesis.  Peripheral smear revealed leukopenia (WBC 1900) with 2% atypical lymphoid cells compatible with hairy cell leukemia. There was moderate anemia with anisopoikilocytosis. 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  FISH studies for MDS were negative.  Cytogenetics were normal (46, XY).  He has an unclear allergy history.  He may be allergic to Primaxin, Zoloft, allopurinol, and a chemotherapy medication.  Reaction occurred in 06/2005.  He is allergic to sulfa.  He is not allergic to dapsone.  He requires washed RBCs.  He is on prophylactic acyclovir.  Dapsone and voriconazole were discontinued.  He has a  history of shingles prior to prophylactic acyclovir.  He is 17 months status post cladribine (began 03/03/2015).  His counts plateaued on 07/25/2015 and have subsequently decreased.  Labs on 07/25/2015  revealed a normal B12 (894), folate (60.5), and TSH (2.562).  Ferritin was 50 on 04/26/2016.  Labs on 08/22/2016 revealed a normal B12, folate, and TSH.  He is on oral B12.  B12 was 693 on 05/27/2017.  Bone marrow on 11/03/2015 revealed persistent plasma cell neoplasm, now with mildy atypical monoclonal plasma cells estimated at 20-30%.   There was no morphologic evidence of residual hairy cell leukemia.  There was a minute population (0.03%) with hairy cell immunophenotype detected by flow.  Marrow was normocellular for age with relative erythroid hyperplasia, relative myeloid hypoplasia, mild dyspoiesis, adequate megakaryocytes and no increased blasts.  There was no significant increase in marrow reticulin fibers.  Iron was present.  FISH studies for MDS were negative.  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  Cytogenetics were normal (46, XY).  Bone marrow on 03/13/2016 was normocellular to marginally hypercellular for age (20-40%) with relative erythroid hyperplasia and mild dyserythropoiesis, decreased granulopoiesis and adequate megakaryocytes.  There was a persistent 15-20% plasma cell neoplasm.  There was no overt increase in reticulin fibrosis.  Storage iron was present.  Flow cytometry revealed abnormal/monocytic plasma cell population (3% sample), consistent with a plasma cell neoplastic process.  Cytogenetics were normal (46, XY).  FISH studies for myeloma revealed a CCND1/IGH translocation, t(11;14) in 53% of nuclei.  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  There was no evidence of leukemia, lymphoma, or high grade dysplasia.  Cytogenetics were normal (46, XY).  SPEP has been followed: 1.0 gm/dL on 02/03/2015, 1.4 on 07/25/2015, 1.3 on 11/17/2015, 1.2 on 06/28/2016, 1.0 on 09/20/2016, 0.8 on 04/29/2017, 0.7 [facility]) on 05/12/2017, and 1.2 on 09/29/2017.    Kappa free  light chains have been followed: 47.63 (ratio 3.12) on 02/03/2015, 54.9 (ratio 3.14) on 02/07/2015, 46.23 (ratio 3.68) on 07/25/2015, 50.5 (ratio 4.82) on 11/17/2015, and 44.3 (ratio 4.22) on 09/01/2017.  Beta2-microglobulin was 2.5 on 11/17/2015.  24 hour urine on 11/21/2015 revealed 234.8 mg protein in 24 hours (no monoclonal protein).  Bone survey on 11/24/2015 revealed no lytic lesions.  Epo level was 329.6 on 03/29/2016.  He began a trial of Procrit.  He received 10,000 units of Procrit weekly (04/26/2016 - 06/14/2016).  Hemoglobin ranged between 8.4 and 8.9 without trend.  He receives PRBCs (washed and irradiated) to maintain a hemoglobin > 9.0.  He has received x units to date (last on 12/12/2017).  He is s/p 1 cycle ofRevlimid (10 mg) and Decadron(06/29/2016 - 07/19/2016). Cycle #1 was complicated by anemia and neutropenia. He has received 4 units of PRBCs(1 unit:07/22/2016 and 2 units: 08/07/2016, 1 unit on 01/14/2017, and 1 unit on 02/27/2017; 1 unit on 04/30/2017). He received 6 days ofGCSF.  He is s/p 5 cycles of Revlimid (5 mg) and Decadron (09/14/2016 - 04/01/2017).  Revlimid was on temporary hold beginning 03/04/2017.  Revlimid was discontinued on 05/27/2017.  He began weekly GCSF on 11/05/2016 (last 06/10/2017).  He receives GCSF if his ANC is <= 1500.  He is on aspirin 81 mg a day prophylaxis.  PET scan on 03/14/2016 revealed no evidence of hypermetabolic lymphadenopathy or focal skeletal lesions to suggest marrow or cortical bone involvement.  Echo on 06/06/2017 revealed an EF of 65-70%.  Mitral valve was calcified.  The left atrium was mildly dilated.  Symptomatically, he feels "fair".  Exam is stable.  Counts include a hematocrit 26.4, hemoglobin of 9.2, platelets 85,000, and WBC 2800 with ANC 1800.  Plan: 1.  Labs today: CBC with diff, CMP, B12, folate, SPEP, free light chains, hold tube. 2.  Review interval appointment at [facility] (Dr [name]). 3.  Update  transfusion (# units to date).   2.  Discuss continued surveillance. Awaiting input from Dr. [name]. Patient scheduled to see Dr. Evelene Croon on 12/01/2017. Review that preliminary marrow report notes kappa restricted plasmacytosis rather than the suspected myelodysplastic syndrome.  Given the marrow results, Vidaza was not indicated.  Discuss ongoing observation as counts stable. 3.  Discussed anemia.  Hemoglobin 9.2 today. Patient feels generally well. 4.  ANC 1800. Patient receives GCSF if ANC < 1500. No GCSF today. 5.  RTC every 2 weeks for labs (CBC with diff).  Discuss checking labs less frequently.  Patient wishes to continue current schedule. 6.  RTC in 3 months for MD assessment and labs (CBC with diff, CMP).   Lequita Asal, MD  01/15/2018, 4:31 AM   I saw and evaluated the patient, participating in the key portions of the service and reviewing pertinent diagnostic studies and records.  I reviewed the nurse practitioner's note and agree with the findings and the plan.  The assessment and plan were discussed with the patient.  Multiple questions were asked by the patient and answered.   Lequita Asal, MD  01/15/2018, 4:31 AM

## 2018-01-19 ENCOUNTER — Inpatient Hospital Stay: Payer: Medicare Other | Admitting: Hematology and Oncology

## 2018-01-19 ENCOUNTER — Encounter: Payer: Self-pay | Admitting: Hematology and Oncology

## 2018-01-19 ENCOUNTER — Other Ambulatory Visit: Payer: Self-pay | Admitting: *Deleted

## 2018-01-19 ENCOUNTER — Inpatient Hospital Stay: Payer: Medicare Other | Attending: Hematology and Oncology

## 2018-01-19 VITALS — HR 78 | Temp 98.3°F | Resp 18 | Wt 221.3 lb

## 2018-01-19 DIAGNOSIS — C9000 Multiple myeloma not having achieved remission: Secondary | ICD-10-CM | POA: Diagnosis not present

## 2018-01-19 DIAGNOSIS — D469 Myelodysplastic syndrome, unspecified: Secondary | ICD-10-CM | POA: Diagnosis not present

## 2018-01-19 DIAGNOSIS — Z7982 Long term (current) use of aspirin: Secondary | ICD-10-CM | POA: Insufficient documentation

## 2018-01-19 DIAGNOSIS — Z856 Personal history of leukemia: Secondary | ICD-10-CM

## 2018-01-19 DIAGNOSIS — D472 Monoclonal gammopathy: Secondary | ICD-10-CM

## 2018-01-19 DIAGNOSIS — E119 Type 2 diabetes mellitus without complications: Secondary | ICD-10-CM | POA: Insufficient documentation

## 2018-01-19 DIAGNOSIS — R197 Diarrhea, unspecified: Secondary | ICD-10-CM | POA: Insufficient documentation

## 2018-01-19 DIAGNOSIS — C9141 Hairy cell leukemia, in remission: Secondary | ICD-10-CM

## 2018-01-19 DIAGNOSIS — E538 Deficiency of other specified B group vitamins: Secondary | ICD-10-CM

## 2018-01-19 DIAGNOSIS — D4620 Refractory anemia with excess of blasts, unspecified: Secondary | ICD-10-CM

## 2018-01-19 DIAGNOSIS — Z87891 Personal history of nicotine dependence: Secondary | ICD-10-CM | POA: Insufficient documentation

## 2018-01-19 DIAGNOSIS — D539 Nutritional anemia, unspecified: Secondary | ICD-10-CM

## 2018-01-19 DIAGNOSIS — Z7189 Other specified counseling: Secondary | ICD-10-CM

## 2018-01-19 DIAGNOSIS — D46Z Other myelodysplastic syndromes: Secondary | ICD-10-CM

## 2018-01-19 LAB — CBC WITH DIFFERENTIAL/PLATELET
Basophils Absolute: 0 10*3/uL (ref 0–0.1)
Basophils Relative: 1 %
Eosinophils Absolute: 0.1 10*3/uL (ref 0–0.7)
Eosinophils Relative: 3 %
HCT: 26 % — ABNORMAL LOW (ref 40.0–52.0)
Hemoglobin: 9.2 g/dL — ABNORMAL LOW (ref 13.0–18.0)
Lymphocytes Relative: 21 %
Lymphs Abs: 0.6 10*3/uL — ABNORMAL LOW (ref 1.0–3.6)
MCH: 37.2 pg — ABNORMAL HIGH (ref 26.0–34.0)
MCHC: 35.3 g/dL (ref 32.0–36.0)
MCV: 105.2 fL — ABNORMAL HIGH (ref 80.0–100.0)
Monocytes Absolute: 0.2 10*3/uL (ref 0.2–1.0)
Monocytes Relative: 7 %
Neutro Abs: 1.9 10*3/uL (ref 1.4–6.5)
Neutrophils Relative %: 68 %
Platelets: 92 10*3/uL — ABNORMAL LOW (ref 150–440)
RBC: 2.47 MIL/uL — ABNORMAL LOW (ref 4.40–5.90)
RDW: 19 % — ABNORMAL HIGH (ref 11.5–14.5)
WBC: 2.7 10*3/uL — ABNORMAL LOW (ref 3.8–10.6)

## 2018-01-19 LAB — COMPREHENSIVE METABOLIC PANEL
ALT: 17 U/L (ref 17–63)
AST: 19 U/L (ref 15–41)
Albumin: 4 g/dL (ref 3.5–5.0)
Alkaline Phosphatase: 57 U/L (ref 38–126)
Anion gap: 9 (ref 5–15)
BUN: 33 mg/dL — ABNORMAL HIGH (ref 6–20)
CO2: 21 mmol/L — ABNORMAL LOW (ref 22–32)
Calcium: 9 mg/dL (ref 8.9–10.3)
Chloride: 106 mmol/L (ref 101–111)
Creatinine, Ser: 1.61 mg/dL — ABNORMAL HIGH (ref 0.61–1.24)
GFR calc Af Amer: 45 mL/min — ABNORMAL LOW (ref >60)
GFR calc non Af Amer: 38 mL/min — ABNORMAL LOW (ref >60)
Glucose, Bld: 215 mg/dL — ABNORMAL HIGH (ref 65–99)
Potassium: 4.3 mmol/L (ref 3.5–5.1)
Sodium: 136 mmol/L (ref 135–145)
Total Bilirubin: 0.6 mg/dL (ref 0.3–1.2)
Total Protein: 7.4 g/dL (ref 6.5–8.1)

## 2018-01-19 LAB — FOLATE: Folate: 38 ng/mL (ref >5.9)

## 2018-01-19 LAB — VITAMIN B12: Vitamin B-12: 308 pg/mL (ref 180–914)

## 2018-01-19 NOTE — Progress Notes (Signed)
[facility] day:  01/19/2018    Chief Complaint: Alex Smith is a 83 y.o. male with a myelodysplastic marrow, smoldering myeloma, and a history of hairy cell leukemia who is seen for 3 month assessment.  HPI:  The patient was last seen in the medical oncology clinic on 10/13/2017.  At that time, he felt "fair".  Exam was stable.  Counts included a hematocrit 26.4, hemoglobin of 9.2, platelets 85,000, and WBC 2800 with ANC 1800.  He saw Dr. [name] on 12/01/2017.  His smoldering myeloma appeared stable and there was no evidence of hairy cell leukemia. While there was no official myelodysplastic syndrome, there was not a better explanation for his anemia. Other cell counts were better.  Surveillance was recommended.  Recommendations were transfusions to maintain a hemoglobin > 9g/dL.  If hemoglobin worsens or paraprotein increases, would consider Darzalex chemotherapy for multiple myeloma.  She would consider Luspatercept when it becomes available for anemia in low-risk MDS  He is scheduled for follow-up in 6 months.  He received PRBCs on 12/12/2017 for a hemoglobin of 8.6.  During the interim, patient is doing well. He has been having diarrhea for the last couple of months. He denies recent dietary changes and recent antibiotic therapy.  He denies shortness of breath, chest pain, and vertigo symptoms. He has not experienced any B symptoms or recent infections. Patient denies bleeding; no hematochezia, melena, or gross hematuria. He has not appreciated any areas of unexplained bruising or palpable adenopathy. Patient is on a new antihypertensive (Cardura).   Patient is eating well. Weight is down 3 pounds. Patient denies pain in the clinic today.    Past Medical History:  Diagnosis Date  . Anemia   . B12 deficiency   . Basal cell carcinoma of face   . BPH (benign prostatic hypertrophy)    followed by urology, discharged (Dr. Bernardo Heater)  . CAP  (community acquired pneumonia) 02/15/2015  . CKD stage 3 due to type 2 diabetes mellitus ([place]) 11/14/2017  . Colon polyps   . Diverticulosis   . GERD (gastroesophageal reflux disease)   . Hairy cell leukemia ([place]) 2006   recurrent, seizure on rituxan, now on cladribine ([facility])  . History of pneumonia 2000's   "once" (07/07/2012)  . History of shingles   . HLD (hyperlipidemia)   . Hypertension   . Pneumonia   . Shortness of breath dyspnea   . Systolic murmur 25/85/2778  . Type 2 diabetes, controlled, with retinopathy ([place])     Past Surgical History:  Procedure Laterality Date  . [place] VITRECTOMY WITH 20 GAUGE MVR PORT FOR MACULAR HOLE  07/07/2012   Procedure: 25 GAUGE PARS PLANA VITRECTOMY WITH 20 GAUGE MVR PORT FOR MACULAR HOLE;  Surgeon: [facility] Pedro, MD;  Location: [facility];  Service: Ophthalmology;  Laterality: Left;  . BONE MARROW BIOPSY  2016  . CARDIOVASCULAR STRESS TEST  2013   treadmill - no evidence ischemia, EF 61%  . CATARACT EXTRACTION W/ INTRAOCULAR LENS  IMPLANT, BILATERAL  ~ 2010  . COLONOSCOPY  2014   [place] WNL no rpt needed, h/o polyps  . EYE SURGERY Left 06/2012   laser surgery  . GAS INSERTION  07/07/2012   Procedure: INSERTION OF GAS;  Surgeon: [facility] Pedro, MD;  Location: [facility];  Service: Ophthalmology;  Laterality: Left;  [id number]  . PERIPHERAL VASCULAR CATHETERIZATION N/A 02/23/2015   Procedure: Glori Luis Cath Insertion;  Surgeon: Corene Cornea  Bunnie Domino, MD;  Location: [facility];  Service: Cardiovascular;  Laterality: N/A;  . SERUM PATCH  07/07/2012   Procedure: SERUM PATCH;  Surgeon: [facility] Pedro, MD;  Location: [facility];  Service: Ophthalmology;  Laterality: Left;  . SKIN CANCER EXCISION     "all over my face" (07/07/2012)    Family History  Problem Relation Age of Onset  . Dementia Mother   . Heart failure Father 57  . Cancer Sister        breast  . Diabetes Paternal Uncle   . Diabetes Paternal Aunt   . CAD Brother 25       MI  .  Stroke Neg Hx     Social History:  reports that he quit smoking about 48 years ago. His smoking use included cigarettes. He has a 50.00 pack-year smoking history. He has never used smokeless tobacco. He reports that he drinks alcohol. He reports that he does not use drugs.  He works at [employer] 20-32 hours/week.  The patient 's wife has had 3 hip operations.  She underwent hip replacement 03/06/2016.  His wife is in [place].  He states that they either eat out or bring food in.  His best contact number is his cell: [phone]. Patient has a daughter [name]). He is alone today.  Allergies:  Allergies  Allergen Reactions  . Rituximab Rash    Chest tightness Chest tightness  . Blood-Group Specific Substance Other (See Comments)    Had a post transfusion reaction of red blood cells; NOW REQUIRES WASHED BLOOD CELLS Had a post transfusion reaction of red blood cells; NOW REQUIRES WASHED BLOOD CELLS  . Primaxin [Imipenem] Other (See Comments)    Possible allergy Possible allergy  . Voriconazole Other (See Comments)  . Sulfa Antibiotics Itching and Rash  . Sulfacetamide Sodium Itching and Rash  Possible allergy to Zoloft, allopurinol, and a chemotherapy medication.  He tolerates Dapsone.  Current Medications: Current Outpatient Medications  Medication Sig Dispense Refill  . ACCU-CHEK AVIVA PLUS test strip USE TO CHECK SUGAR TWICE DAILY 200 each 0  . acyclovir (ZOVIRAX) 400 MG tablet TAKE TWO TABLETS BY MOUTH TWICE DAILY 120 tablet 3  . aspirin EC 81 MG tablet Take 81 mg by mouth daily.    Marland Kitchen doxazosin (CARDURA) 1 MG tablet Take 1 tablet (1 mg total) by mouth daily. 30 tablet 6  . HUMULIN N 100 UNIT/ML injection INJECT 39 UNITS SUBCUTANEOUSLY TWICE DAILY BEFORE A MEAL 20 mL 6  . hydrochlorothiazide (HYDRODIURIL) 25 MG tablet TAKE ONE TABLET EVERY DAY 30 tablet 1  . insulin regular (HUMULIN R) 100 units/mL injection Inject 0.1 mLs (10 Units total) into the skin 2 (two) times daily before a  meal. (33 units daily) 10 mL 5  . lidocaine-prilocaine (EMLA) cream Apply 1 application topically as needed. 30 g 3  . lisinopril (PRINIVIL,ZESTRIL) 20 MG tablet TAKE ONE TABLET TWICE DAILY 60 tablet 1  . metFORMIN (GLUCOPHAGE) 1000 MG tablet TAKE ONE TABLET TWICE DAILY 180 tablet 1  . MICROLET LANCETS MISC USE AS DIRECTED 200 each 3  . pravastatin (PRAVACHOL) 20 MG tablet TAKE ONE TABLET BY MOUTH AT BEDTIME 30 tablet 2  . ranitidine (ZANTAC) 150 MG tablet TAKE ONE TABLET BY MOUTH TWICE DAILY AS NEEDED 180 tablet 3  . tamsulosin (FLOMAX) 0.4 MG CAPS capsule TAKE 1 CAPSULE BY MOUTH EVERY DAY 30 capsule 3  . temazepam (RESTORIL) 7.5 MG capsule Take 7.5 mg by mouth once.     Marland Kitchen  traZODone (DESYREL) 50 MG tablet TAKE 1/2 TO 1 TABLET BY MOUTH AT BEDTIMEAS NEEDED FOR SLEEP 30 tablet 0  . vitamin E 400 UNIT capsule Take 200 Units by mouth daily.      Current Facility-Administered Medications  Medication Dose Route Frequency Provider Last Rate Last Dose  . Tbo-Filgrastim (GRANIX) injection 480 mcg  480 mcg Subcutaneous Once Lequita Asal, MD       Facility-Administered Medications Ordered in Other Visits  Medication Dose Route Frequency Provider Last Rate Last Dose  . heparin lock flush 100 unit/mL  500 Units Intravenous Once Denvil Canning C, MD      . sodium chloride 0.9 % injection 10 mL  10 mL Intravenous PRN Lequita Asal, MD   10 mL at 03/03/15 0903  . sodium chloride 0.9 % injection 10 mL  10 mL Intracatheter PRN Nolon Stalls C, MD   10 mL at 03/10/15 1410    Review of Systems  Constitutional: Positive for malaise/fatigue and weight loss (down 3 pounds). Negative for diaphoresis and fever.  HENT: Negative.  Negative for nosebleeds and sinus pain.   Eyes: Negative.  Negative for double vision, pain, discharge and redness.  Respiratory: Negative for cough, hemoptysis, sputum production and shortness of breath.   Cardiovascular: Negative for chest pain, palpitations,  orthopnea, leg swelling and PND.  Gastrointestinal: Positive for diarrhea (x 2 months). Negative for abdominal pain, blood in stool, constipation, melena, nausea and vomiting.  Genitourinary: Negative for dysuria, frequency, hematuria and urgency.  Musculoskeletal: Negative for back pain, falls, joint pain and myalgias.  Skin: Negative for itching and rash.  Neurological: Negative for dizziness, tremors, weakness and headaches.  Endo/Heme/Allergies: Does not bruise/bleed easily.       Diabetes  Psychiatric/Behavioral: Negative for depression, memory loss and suicidal ideas. The patient is not nervous/anxious and does not have insomnia.   All other systems reviewed and are negative.  Performance status (ECOG): 1 - Symptomatic but completely ambulatory  Physical Exam: Pulse 78, temperature 98.3 F (36.8 C), temperature source Tympanic, resp. rate 18, weight 221 lb 5 oz (100.4 kg).  GENERAL:  Well developed, well nourished, gentleman sitting comfortably in the exam room in no acute distress. MENTAL STATUS:  Alert and oriented to person, place and time. HEAD:  White hair.  Normocephalic, atraumatic, face symmetric, no Cushingoid features. EYES:  Glasses.  Blue eyes.  Pupils equal round and reactive to light and accomodation.  No conjunctivitis or scleral icterus. ENT:  Oropharynx clear without lesion.  Tongue normal. Mucous membranes moist.  RESPIRATORY:  Clear to auscultation without rales, wheezes or rhonchi. CARDIOVASCULAR:  Grade III systolic murmur.  Regular rate and rhythm without rub or gallop. ABDOMEN:  Soft, non-tender, with active bowel sounds, and no hepatosplenomegaly.  No masses. SKIN:  No rashes, ulcers or lesions. EXTREMITIES: No edema, no skin discoloration or tenderness.  No palpable cords. LYMPH NODES: No palpable cervical, supraclavicular, axillary or inguinal adenopathy  NEUROLOGICAL: Unremarkable. PSYCH:  Appropriate.    Appointment on 01/19/2018  Component Date Value  Ref Range Status  . Sodium 01/19/2018 136  135 - 145 mmol/L Final  . Potassium 01/19/2018 4.3  3.5 - 5.1 mmol/L Final  . Chloride 01/19/2018 106  101 - 111 mmol/L Final  . CO2 01/19/2018 21* 22 - 32 mmol/L Final  . Glucose, Bld 01/19/2018 215* 65 - 99 mg/dL Final  . BUN 01/19/2018 33* 6 - 20 mg/dL Final  . Creatinine, Ser 01/19/2018 1.61* 0.61 - 1.24 mg/dL Final  .  Calcium 01/19/2018 9.0  8.9 - 10.3 mg/dL Final  . Total Protein 01/19/2018 7.4  6.5 - 8.1 g/dL Final  . Albumin 01/19/2018 4.0  3.5 - 5.0 g/dL Final  . AST 01/19/2018 19  15 - 41 U/L Final  . ALT 01/19/2018 17  17 - 63 U/L Final  . Alkaline Phosphatase 01/19/2018 57  38 - 126 U/L Final  . Total Bilirubin 01/19/2018 0.6  0.3 - 1.2 mg/dL Final  . GFR calc non Af Amer 01/19/2018 38* >60 mL/min Final  . GFR calc Af Amer 01/19/2018 45* >60 mL/min Final   Comment: (NOTE) The eGFR has been calculated using the CKD EPI equation. This calculation has not been validated in all clinical situations. eGFR's persistently <60 mL/min signify possible Chronic Kidney Disease.   Georgiann Hahn gap 01/19/2018 9  5 - 15 Final   Performed at [facility], [address] [place], [place] [address]  . WBC 01/19/2018 2.7* 3.8 - 10.6 K/uL Final  . RBC 01/19/2018 2.47* 4.40 - 5.90 MIL/uL Final  . Hemoglobin 01/19/2018 9.2* 13.0 - 18.0 g/dL Final  . HCT 01/19/2018 26.0* 40.0 - 52.0 % Final  . MCV 01/19/2018 105.2* 80.0 - 100.0 fL Final  . MCH 01/19/2018 37.2* 26.0 - 34.0 pg Final  . MCHC 01/19/2018 35.3  32.0 - 36.0 g/dL Final  . RDW 01/19/2018 19.0* 11.5 - 14.5 % Final  . Platelets 01/19/2018 92* 150 - 440 K/uL Final  . Neutrophils Relative % 01/19/2018 68  % Final  . Neutro Abs 01/19/2018 1.9  1.4 - 6.5 K/uL Final  . Lymphocytes Relative 01/19/2018 21  % Final  . Lymphs Abs 01/19/2018 0.6* 1.0 - 3.6 K/uL Final  . Monocytes Relative 01/19/2018 7  % Final  . Monocytes Absolute 01/19/2018 0.2  0.2 - 1.0 K/uL Final  . Eosinophils Relative  01/19/2018 3  % Final  . Eosinophils Absolute 01/19/2018 0.1  0 - 0.7 K/uL Final  . Basophils Relative 01/19/2018 1  % Final  . Basophils Absolute 01/19/2018 0.0  0 - 0.1 K/uL Final   Performed at [facility], [address] [place], [place] [address]    Assessment:  Alex Smith is a 83 y.o. 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  He was diagnosed with hairy cell leukemia in 2006.  He received cladribine 11/06-11/05/2005.  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   Bone marrow aspirate and biopsy on 02/07/2015 revealed a extensive marrow involvement by recurrent hairy cell leukemia (approximately 80% of cells in the core). There was a monoclonal plasma cell infiltrate (approximately 10%), compatible with a plasma cell neoplasm. Marrow was hypercellular (40-50%) with markedly diminished residual trilineage hematopoiesis and mild multilineage dyspoiesis.  Peripheral smear revealed leukopenia (WBC 1900) with 2% atypical lymphoid cells compatible with hairy cell leukemia. There was moderate anemia with anisopoikilocytosis. 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  FISH studies for MDS were negative.  Cytogenetics were normal (46, XY).  He has an unclear allergy history.  He may be allergic to Primaxin, Zoloft, allopurinol, and a chemotherapy medication.  Reaction occurred in 06/2005.  He is allergic to sulfa.  He is not allergic to dapsone.  He requires washed RBCs.  He is on prophylactic acyclovir.  Dapsone and voriconazole were discontinued.  He has a history of shingles prior to prophylactic acyclovir.  He is 34 months status post cladribine (began 03/03/2015).  His counts plateaued on 07/25/2015 and have subsequently decreased.  Labs on 07/25/2015 revealed a normal B12 (894), folate (60.5), and TSH (2.562).  Ferritin was 50 on 04/26/2016.  Labs on 08/22/2016 revealed a normal B12,  folate, and TSH.  He is on oral B12.  B12 was 693 on 05/27/2017.  Bone marrow on 11/03/2015 revealed persistent plasma cell neoplasm, now with mildy atypical monoclonal plasma cells estimated at 20-30%.   There was no morphologic evidence of residual hairy cell leukemia.  There was a minute population (0.03%) with hairy cell immunophenotype detected by flow.  Marrow was normocellular for age with relative erythroid hyperplasia, relative myeloid hypoplasia, mild dyspoiesis, adequate megakaryocytes and no increased blasts.  There was no significant increase in marrow reticulin fibers.  Iron was present.  FISH studies for MDS were negative.  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  Cytogenetics were normal (46, XY).  Bone marrow on 03/13/2016 was normocellular to marginally hypercellular for age (20-40%) with relative erythroid hyperplasia and mild dyserythropoiesis, decreased granulopoiesis and adequate megakaryocytes.  There was a persistent 15-20% plasma cell neoplasm.  There was no overt increase in reticulin fibrosis.  Storage iron was present.  Flow cytometry revealed abnormal/monocytic plasma cell population (3% sample), consistent with a plasma cell neoplastic process.  Cytogenetics were normal (46, XY).  FISH studies for myeloma revealed a CCND1/IGH translocation, t(11;14) in 53% of nuclei.  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  There was no evidence of leukemia, lymphoma, or high grade dysplasia.  Cytogenetics were normal (46, XY).  SPEP has been followed: 1.0 gm/dL on 02/03/2015, 1.4 on 07/25/2015, 1.3 on 11/17/2015, 1.2 on 06/28/2016, 1.0 on 09/20/2016, 0.8 on 04/29/2017, 0.7 [facility]) on 05/12/2017, and 1.2 on 09/29/2017.    Kappa free light chains have been followed: 47.63 (ratio 3.12) on 02/03/2015, 54.9 (ratio 3.14) on 02/07/2015, 46.23 (ratio 3.68) on 07/25/2015, 50.5  (ratio 4.82) on 11/17/2015, and 44.3 (ratio 4.22) on 09/01/2017.  Beta2-microglobulin was 2.5 on 11/17/2015.  24 hour urine on 11/21/2015 revealed 234.8 mg protein in 24 hours (no monoclonal protein).  Bone survey on 11/24/2015 revealed no lytic lesions.  Epo level was 329.6 on 03/29/2016.  He began a trial of Procrit.  He received 10,000 units of Procrit weekly (04/26/2016 - 06/14/2016).  Hemoglobin ranged between 8.4 and 8.9 without trend.  He receives PRBCs (washed and irradiated) to maintain a hemoglobin > 9.0.  He has received 10 units to date (last on 12/12/2017).  He is s/p 1 cycle ofRevlimid (10 mg) and Decadron(06/29/2016 - 07/19/2016). Cycle #1 was complicated by anemia and neutropenia. He has received 4 units of PRBCs(1 unit:07/22/2016 and 2 units: 08/07/2016, 1 unit on 01/14/2017, and 1 unit on 02/27/2017; 1 unit on 04/30/2017). He received 6 days ofGCSF.  He is s/p 5 cycles of Revlimid (5 mg) and Decadron (09/14/2016 - 04/01/2017).  Revlimid was on temporary hold beginning 03/04/2017.  Revlimid was discontinued on 05/27/2017.  He began weekly GCSF on 11/05/2016 (last 06/10/2017).  He receives GCSF if his ANC is <= 1500.  He is on aspirin 81 mg a day prophylaxis.  PET scan on 03/14/2016 revealed no evidence of hypermetabolic lymphadenopathy or focal skeletal lesions to suggest marrow or cortical bone involvement.  Echo on 06/06/2017 revealed an EF of 65-70%.  Mitral valve was calcified.  The left atrium was mildly dilated.  Symptomatically, he feels "ok".  Patient has been having diarrhea  for the last 2 months. No recent dietary changes or antibiotic therapy. He notes some fatigue. Denies denies chest pain and shortness of breath. Exam is stable.  Counts include a hematocrit 26.0, hemoglobin of 9.2, platelets 92,000, and WBC 2700 with ANC 1900.  Creatinine is 1.61.  Plan: 1.  Labs today: CBC with diff, CMP, B12, folate, SPEP, free light chains, hold tube. 2.  Review interval  appointment at [facility] (Dr. [name]). 3.  Discussed anemia.  Hemoglobin stable at 9.2 today. Patient feels generally well. Review goal of maintaining hemoglobin > 9.0. 4.  Discuss current ANC of 1900. Patient receives GCSF if ANC < 1500. No GCSF today. 5.  Stool for C diff and GI panel by PCR. 6.  Discuss slightly increased creatinine over baseline (1.37 -1.45) likely secondary to diarrhea.  Only new medication is Cardura.  Follow-up with Dr. Ria Bush. 7.  RTC every 2 weeks for labs (CBC with diff).  Discuss checking labs less frequently.  Patient wishes to continue current schedule. 8.  RTC in 3 months for MD assessment and labs (CBC with diff, CMP).   Honor Loh, NP  01/19/2018, 4:31 PM   I saw and evaluated the patient, participating in the key portions of the service and reviewing pertinent diagnostic studies and records.  I reviewed the nurse practitioner's note and agree with the findings and the plan.  The assessment and plan were discussed with the patient.  Several questions were asked by the patient and answered.   Lequita Asal, MD  01/19/2018, 4:31 PM

## 2018-01-19 NOTE — Progress Notes (Signed)
Patient states "every 2-3 days he has diarrhea like crazy".  It resolved without medication.

## 2018-01-20 LAB — KAPPA/LAMBDA LIGHT CHAINS
Kappa free light chain: 48.9 mg/L — ABNORMAL HIGH (ref 3.3–19.4)
Kappa, lambda light chain ratio: 4.29 — ABNORMAL HIGH (ref 0.26–1.65)
Lambda free light chains: 11.4 mg/L (ref 5.7–26.3)

## 2018-01-21 LAB — PROTEIN ELECTROPHORESIS, SERUM
A/G Ratio: 1.2 (ref 0.7–1.7)
Albumin ELP: 3.8 g/dL (ref 2.9–4.4)
Alpha-1-Globulin: 0.2 g/dL (ref 0.0–0.4)
Alpha-2-Globulin: 0.7 g/dL (ref 0.4–1.0)
Beta Globulin: 0.8 g/dL (ref 0.7–1.3)
Gamma Globulin: 1.4 g/dL (ref 0.4–1.8)
Globulin, Total: 3.1 g/dL (ref 2.2–3.9)
M-Spike, %: 1.3 g/dL — ABNORMAL HIGH
Total Protein ELP: 6.9 g/dL (ref 6.0–8.5)

## 2018-02-02 ENCOUNTER — Other Ambulatory Visit: Payer: Self-pay | Admitting: Family Medicine

## 2018-02-02 ENCOUNTER — Inpatient Hospital Stay: Payer: Medicare Other

## 2018-02-02 DIAGNOSIS — D469 Myelodysplastic syndrome, unspecified: Secondary | ICD-10-CM | POA: Diagnosis not present

## 2018-02-02 DIAGNOSIS — Z7982 Long term (current) use of aspirin: Secondary | ICD-10-CM | POA: Diagnosis not present

## 2018-02-02 DIAGNOSIS — C9000 Multiple myeloma not having achieved remission: Secondary | ICD-10-CM | POA: Diagnosis not present

## 2018-02-02 DIAGNOSIS — Z856 Personal history of leukemia: Secondary | ICD-10-CM | POA: Diagnosis not present

## 2018-02-02 DIAGNOSIS — Z87891 Personal history of nicotine dependence: Secondary | ICD-10-CM | POA: Diagnosis not present

## 2018-02-02 DIAGNOSIS — D472 Monoclonal gammopathy: Secondary | ICD-10-CM

## 2018-02-02 DIAGNOSIS — E119 Type 2 diabetes mellitus without complications: Secondary | ICD-10-CM | POA: Diagnosis not present

## 2018-02-02 DIAGNOSIS — C9141 Hairy cell leukemia, in remission: Secondary | ICD-10-CM

## 2018-02-02 DIAGNOSIS — R197 Diarrhea, unspecified: Secondary | ICD-10-CM | POA: Diagnosis not present

## 2018-02-02 LAB — CBC WITH DIFFERENTIAL/PLATELET
Basophils Absolute: 0 10*3/uL (ref 0–0.1)
Basophils Relative: 1 %
Eosinophils Absolute: 0.1 10*3/uL (ref 0–0.7)
Eosinophils Relative: 3 %
HCT: 25.9 % — ABNORMAL LOW (ref 40.0–52.0)
Hemoglobin: 9.2 g/dL — ABNORMAL LOW (ref 13.0–18.0)
Lymphocytes Relative: 20 %
Lymphs Abs: 0.5 10*3/uL — ABNORMAL LOW (ref 1.0–3.6)
MCH: 37.9 pg — ABNORMAL HIGH (ref 26.0–34.0)
MCHC: 35.5 g/dL (ref 32.0–36.0)
MCV: 106.6 fL — ABNORMAL HIGH (ref 80.0–100.0)
Monocytes Absolute: 0.2 10*3/uL (ref 0.2–1.0)
Monocytes Relative: 7 %
Neutro Abs: 1.9 10*3/uL (ref 1.4–6.5)
Neutrophils Relative %: 69 %
Platelets: 95 10*3/uL — ABNORMAL LOW (ref 150–440)
RBC: 2.43 MIL/uL — ABNORMAL LOW (ref 4.40–5.90)
RDW: 18.8 % — ABNORMAL HIGH (ref 11.5–14.5)
WBC: 2.7 10*3/uL — ABNORMAL LOW (ref 3.8–10.6)

## 2018-02-02 LAB — SAMPLE TO BLOOD BANK

## 2018-02-05 ENCOUNTER — Other Ambulatory Visit: Payer: Self-pay | Admitting: Family Medicine

## 2018-02-10 ENCOUNTER — Other Ambulatory Visit: Payer: Self-pay | Admitting: Family Medicine

## 2018-02-16 ENCOUNTER — Ambulatory Visit (INDEPENDENT_AMBULATORY_CARE_PROVIDER_SITE_OTHER): Payer: Medicare Other

## 2018-02-16 VITALS — BP 132/62 | HR 81 | Temp 97.9°F | Ht 66.25 in | Wt 223.2 lb

## 2018-02-16 DIAGNOSIS — E785 Hyperlipidemia, unspecified: Secondary | ICD-10-CM | POA: Diagnosis not present

## 2018-02-16 DIAGNOSIS — Z794 Long term (current) use of insulin: Secondary | ICD-10-CM

## 2018-02-16 DIAGNOSIS — Z0000 Encounter for general adult medical examination without abnormal findings: Secondary | ICD-10-CM | POA: Diagnosis not present

## 2018-02-16 DIAGNOSIS — E11319 Type 2 diabetes mellitus with unspecified diabetic retinopathy without macular edema: Secondary | ICD-10-CM

## 2018-02-16 DIAGNOSIS — I10 Essential (primary) hypertension: Secondary | ICD-10-CM

## 2018-02-16 LAB — LIPID PANEL
Cholesterol: 147 mg/dL (ref 0–200)
HDL: 34.6 mg/dL — ABNORMAL LOW (ref >39.00)
NONHDL: 112.89
Total CHOL/HDL Ratio: 4
Triglycerides: 381 mg/dL — ABNORMAL HIGH (ref 0.0–149.0)
VLDL: 76.2 mg/dL — ABNORMAL HIGH (ref 0.0–40.0)

## 2018-02-16 LAB — TSH: TSH: 2.05 u[IU]/mL (ref 0.35–4.50)

## 2018-02-16 LAB — LDL CHOLESTEROL, DIRECT: LDL DIRECT: 65 mg/dL

## 2018-02-16 NOTE — Patient Instructions (Signed)
Alex Smith , Thank you for taking time to come for your Medicare Wellness Visit. I appreciate your ongoing commitment to your health goals. Please review the following plan we discussed and let me know if I can assist you in the future.   These are the goals we discussed: Goals    . Patient Stated     When weather permits, I will attempt to walk 1 mile 5 days per week.        This is a list of the screening recommended for you and due dates:  Health Maintenance  Topic Date Due  . Eye exam for diabetics  03/18/2018*  . DTaP/Tdap/Td vaccine (1 - Tdap) 02/17/2019*  . Tetanus Vaccine  02/17/2019*  . Complete foot exam   03/14/2018  . Flu Shot  03/19/2018  . Hemoglobin A1C  05/17/2018  . Pneumonia vaccines  Completed  *Topic was postponed. The date shown is not the original due date.   Preventive Care for Adults  A healthy lifestyle and preventive care can promote health and wellness. Preventive health guidelines for adults include the following key practices.  . A routine yearly physical is a good way to check with your health care provider about your health and preventive screening. It is a chance to share any concerns and updates on your health and to receive a thorough exam.  . Visit your dentist for a routine exam and preventive care every 6 months. Brush your teeth twice a day and floss once a day. Good oral hygiene prevents tooth decay and gum disease.  . The frequency of eye exams is based on your age, health, family medical history, use  of contact lenses, and other factors. Follow your health care provider's recommendations for frequency of eye exams.  . Eat a healthy diet. Foods like vegetables, fruits, whole grains, low-fat dairy products, and lean protein foods contain the nutrients you need without too many calories. Decrease your intake of foods high in solid fats, added sugars, and salt. Eat the right amount of calories for you. Get information about a proper diet from  your health care provider, if necessary.  . Regular physical exercise is one of the most important things you can do for your health. Most adults should get at least 150 minutes of moderate-intensity exercise (any activity that increases your heart rate and causes you to sweat) each week. In addition, most adults need muscle-strengthening exercises on 2 or more days a week.  Silver Sneakers may be a benefit available to you. To determine eligibility, you may visit the website: www.silversneakers.com or contact program at [phone] Mon-Fri between 8AM-8PM.   . Maintain a healthy weight. The body mass index (BMI) is a screening tool to identify possible weight problems. It provides an estimate of body fat based on height and weight. Your health care provider can find your BMI and can help you achieve or maintain a healthy weight.   For adults 20 years and older: ? A BMI below 18.5 is considered underweight. ? A BMI of 18.5 to 24.9 is normal. ? A BMI of 25 to 29.9 is considered overweight. ? A BMI of 30 and above is considered obese.   . Maintain normal blood lipids and cholesterol levels by exercising and minimizing your intake of saturated fat. Eat a balanced diet with plenty of fruit and vegetables. Blood tests for lipids and cholesterol should begin at age 64 and be repeated every 5 years. If your lipid or cholesterol levels are  high, you are over 50, or you are at high risk for heart disease, you may need your cholesterol levels checked more frequently. Ongoing high lipid and cholesterol levels should be treated with medicines if diet and exercise are not working.  . If you smoke, find out from your health care provider how to quit. If you do not use tobacco, please do not start.  . If you choose to drink alcohol, please do not consume more than 2 drinks per day. One drink is considered to be 12 ounces (355 mL) of beer, 5 ounces (148 mL) of wine, or 1.5 ounces (44 mL) of liquor.  . If you  are 67-64 years old, ask your health care provider if you should take aspirin to prevent strokes.  . Use sunscreen. Apply sunscreen liberally and repeatedly throughout the day. You should seek shade when your shadow is shorter than you. Protect yourself by wearing long sleeves, pants, a wide-brimmed hat, and sunglasses year round, whenever you are outdoors.  . Once a month, do a whole body skin exam, using a mirror to look at the skin on your back. Tell your health care provider of new moles, moles that have irregular borders, moles that are larger than a pencil eraser, or moles that have changed in shape or color.

## 2018-02-16 NOTE — Progress Notes (Signed)
Subjective:   Alex Smith is a 83 y.o. male who presents for Medicare Annual/Subsequent preventive examination.  Review of Systems:  N/A Cardiac Risk Factors include: advanced age (>86mn, >>47women);male gender;hypertension;diabetes mellitus;obesity (BMI >30kg/m2)     Objective:    Vitals: BP 132/62 (BP Location: Right Arm, Patient Position: Sitting, Cuff Size: Normal)   Pulse 81   Temp 97.9 F (36.6 C) (Oral)   Ht 5' 6.25" (1.683 m) Comment: shoes  Wt 223 lb 4 oz (101.3 kg)   SpO2 94%   BMI 35.76 kg/m   Body mass index is 35.76 kg/m.  Advanced Directives 02/16/2018 01/19/2018 10/13/2017 09/01/2017 07/21/2017 06/23/2017 06/10/2017  Does Patient Have a Medical Advance Directive? _0  No No  Would patient like information on creating a medical advance directive? No - Patient declined - - No - Patient declined - No - Patient declined No - Patient declined    Tobacco Social History   Tobacco Use  Smoking Status Former Smoker  . Packs/day: 2.00  . Years: 25.00  . Pack years: 50.00  . Types: Cigarettes  . Last attempt to quit: 02/06/1969  . Years since quitting: 49.0  Smokeless Tobacco Never Used  Tobacco Comment   07/07/2012 "stopped smoking ~ 40 yr ago; smoked 20-233yr    Counseling given: No Comment: 07/07/2012 "stopped smoking ~ 40 yr ago; smoked 20-2559yr Clinical Intake:  Pre-visit preparation completed: Yes  Pain : 0-10 Pain Score: 2  Pain Type: Chronic pain Pain Location: Back     Nutritional Status: BMI > 30  Obese Nutritional Risks: None Diabetes: Yes CBG done?: No Did pt. bring in CBG monitor from home?: No  How often do you need to have someone help you when you read instructions, pamphlets, or other written materials from your doctor or pharmacy?: 1 - Never What is the last grade level you completed in school?: Bachelors degree  Interpreter Needed?: No  Comments: pt is married but lives alone; spouse lives in assisted living  Past  Medical History:  Diagnosis Date  . Anemia   . B12 deficiency   . Basal cell carcinoma of face   . BPH (benign prostatic hypertrophy)    followed by urology, discharged (Dr. StoBernardo Heater. CAP (community acquired pneumonia) 02/15/2015  . CKD stage 3 due to type 2 diabetes mellitus ([facility]/29/2019  . Colon polyps   . Diverticulosis   . GERD (gastroesophageal reflux disease)   . 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 History of pneumonia 2000's   "once" (07/07/2012)  . History of shingles   . HLD (hyperlipidemia)   . Hypertension   . Pneumonia   . Shortness of breath dyspnea   . Systolic murmur 10/53/29/9242 Type 2 diabetes, controlled, with retinopathy ([facility]  Past Surgical History:  Procedure Laterality Date  . 25 Woodland[place]RECTOMY WITH 20 GAUGE MVR PORT FOR MACULAR HOLE  07/07/2012   Procedure: 25 GAUGE PARS PLANA VITRECTOMY WITH 20 GAUGE MVR PORT FOR MACULAR HOLE;  Surgeon: JohHayden PedroD;  [facility];  Laterality: Left;  . BONE MARROW BIOPSY  2016  . CARDIOVASCULAR STRESS TEST  2013   treadmill - no evidence ischemia, EF 61%  . CATARACT EXTRACTION W/ INTRAOCULAR LENS  IMPLANT, BILATERAL  ~ 2010  . COLONOSCOPY  2014   [place] WNL no rpt needed,  h/o polyps  . EYE SURGERY Left 06/2012   laser surgery  . GAS INSERTION  07/07/2012   Procedure: INSERTION OF GAS;  Surgeon: John D Matthews, MD;  [facility];  Service: Ophthalmology;  Laterality: Left;  C3F8  . PERIPHERAL VASCULAR CATHETERIZATION N/A 02/23/2015   Procedure: Porta Cath Insertion;  Surgeon: Jason S Dew, MD;  Location: [facility] INVASIVE CV LAB;  Service: Cardiovascular;  Laterality: N/A;  . SERUM PATCH  07/07/2012   Procedure: SERUM PATCH;  Surgeon: John D Matthews, MD;  [facility];  Service: Ophthalmology;  Laterality: Left;  . SKIN CANCER EXCISION     "all over my face" (07/07/2012)   Family History  Problem  Relation Age of Onset  . Dementia Mother   . Heart failure Father 73  . Cancer Sister        breast  . Diabetes Paternal Uncle   . Diabetes Paternal Aunt   . CAD Brother 47       MI  . Stroke Neg Hx    Social History   Socioeconomic History  . Marital status: Married    Spouse name: Not on file  . Number of children: Not on file  . Years of education: Not on file  . Highest education level: Not on file  Occupational History  . Not on file  Social Needs  . Financial resource strain: Not on file  . Food insecurity:    Worry: Not on file    Inability: Not on file  . Transportation needs:    Medical: Not on file    Non-medical: Not on file  Tobacco Use  . Smoking status: Former Smoker    Packs/day: 2.00    Years: 25.00    Pack years: 50.00    Types: Cigarettes    Last attempt to quit: 02/06/1969    Years since quitting: 49.0  . Smokeless tobacco: Never Used  . Tobacco comment: 07/07/2012 "stopped smoking ~ 40 yr ago; smoked 20-25yr"  Substance and Sexual Activity  . Alcohol use: Yes    Alcohol/week: 0.0 oz    Comment: rare  . Drug use: No  . Sexual activity: Never  Lifestyle  . Physical activity:    Days per week: Not on file    Minutes per session: Not on file  . Stress: Not on file  Relationships  . Social connections:    Talks on phone: Not on file    Gets together: Not on file    Attends religious service: Not on file    Active member of club or organization: Not on file    Attends meetings of clubs or organizations: Not on file    Relationship status: Not on file  Other Topics Concern  . Not on file  Social History Narrative   Married >50 yrs, cares for wife. 1 cat   Occupation: was computer analyst, works part time for [employer]   Activity: likes to golf   Diet: moderate water, fruits/vegetables daily    Outpatient Encounter Medications as of 02/16/2018  Medication Sig  . ACCU-CHEK AVIVA PLUS test strip USE TO CHECK SUGAR TWICE DAILY  . acyclovir  (ZOVIRAX) 400 MG tablet TAKE TWO TABLETS BY MOUTH TWICE DAILY  . aspirin EC 81 MG tablet Take 81 mg by mouth daily.  . doxazosin (CARDURA) 1 MG tablet Take 1 tablet (1 mg total) by mouth daily.  . HUMULIN N 100 UNIT/ML injection INJECT 39 UNITS SUBCUTANEOUSLY TWICE DAILY BEFORE A MEAL  .   hydrochlorothiazide (HYDRODIURIL) 25 MG tablet TAKE ONE TABLET BY MOUTH EVERY DAY  . insulin regular (HUMULIN R) 100 units/mL injection Inject 0.1 mLs (10 Units total) into the skin 2 (two) times daily before a meal. (33 units daily)  . lidocaine-prilocaine (EMLA) cream Apply 1 application topically as needed.  Marland Kitchen lisinopril (PRINIVIL,ZESTRIL) 20 MG tablet TAKE ONE TABLET BY MOUTH TWICE DAILY  . metFORMIN (GLUCOPHAGE) 1000 MG tablet TAKE ONE TABLET TWICE DAILY  . MICROLET LANCETS MISC USE AS DIRECTED  . pravastatin (PRAVACHOL) 20 MG tablet TAKE ONE TABLET BY MOUTH AT BEDTIME  . ranitidine (ZANTAC) 150 MG tablet TAKE ONE TABLET BY MOUTH TWICE DAILY AS NEEDED  . tamsulosin (FLOMAX) 0.4 MG CAPS capsule TAKE 1 CAPSULE BY MOUTH EVERY DAY  . temazepam (RESTORIL) 7.5 MG capsule Take 7.5 mg by mouth once.   . traZODone (DESYREL) 50 MG tablet TAKE 1/2 TO 1 TABLET BY MOUTH AT BEDTIMEAS NEEDED FOR SLEEP  . vitamin E 400 UNIT capsule Take 200 Units by mouth daily.    Facility-Administered Encounter Medications as of 02/16/2018  Medication  . heparin lock flush 100 unit/mL  . sodium chloride 0.9 % injection 10 mL  . sodium chloride 0.9 % injection 10 mL  . Tbo-Filgrastim (GRANIX) injection 480 mcg    Activities of Daily Living In your present state of health, do you have any difficulty performing the following activities: 02/16/2018  Hearing? N  Vision? Y  Difficulty concentrating or making decisions? N  Walking or climbing stairs? Y  Dressing or bathing? N  Doing errands, shopping? N  Preparing Food and eating ? N  Using the Toilet? N  In the past six months, have you accidently leaked urine? N  Do you have  problems with loss of bowel control? N  Managing your Medications? N  Managing your Finances? N  Housekeeping or managing your Housekeeping? N  Some recent data might be hidden    Patient Care Team: Ria Bush, MD as PCP - General (Family Medicine) Lequita Asal, MD as Referring Physician (Hematology and Oncology) Crissie Sickles, MD as Referring Physician (Hematology and Oncology) Crissie Sickles, MD as Referring Physician (Hematology and Oncology)   Assessment:   This is a routine wellness examination for Alex Smith.   Hearing Screening   125Hz 250Hz 500Hz 1000Hz 2000Hz 3000Hz 4000Hz 6000Hz 8000Hz  Right ear:   41 40 40  40    Left ear:   40 40 40  40      Visual Acuity Screening   Right eye Left eye Both eyes  Without correction:     With correction: 20/40-1 20/70-1 20/40     Exercise Activities and Dietary recommendations Current Exercise Habits: The patient does not participate in regular exercise at present, Exercise limited by: respiratory conditions(s);orthopedic condition(s)  Goals    . Patient Stated     When weather permits, I will attempt to walk 1 mile 5 days per week.        Fall Risk Fall Risk  02/16/2018 02/27/2017 12/11/2016 08/07/2016 05/24/2016  Falls in the past year? Yes No No No Yes  Comment fell while walking down stairs; states he was trying to hold too much in his arms and lost balance - - - pt fell onto knees after tripping in parking lot  Number falls in past yr: 1 - - - 1  Injury with Fall? Yes - - - Yes  Risk Factor Category  - - - - -  Risk for fall due to : - - - - -  Risk for fall due to: Comment - - - - -  Follow up - - - - Falls evaluation completed;Falls prevention discussed    Depression Screen PHQ 2/9 Scores 02/16/2018 12/13/2016 12/11/2016 05/24/2016  PHQ - 2 Score 0 2 0 0  PHQ- 9 Score 0 7 - -    Cognitive Function MMSE - Mini Mental State Exam 02/16/2018 12/11/2016 05/24/2016  Orientation to time _0 Orientation to Place  _1 Registration _2 Attention/ Calculation 0 0 0  Recall _3 Recall-comments - - pt was unable to recall 2 of 3 words  Language- name 2 objects 0 0 0  Language- repeat _4 Language- follow 3 step command _5 Language- follow 3 step command-comments unable to follow 1 step of 3 step command - -  Language- read & follow direction 0 0 0  Write a sentence 0 0 0  Copy design 0 0 0  Total score _6 PLEASE NOTE: A Mini-Cog screen was completed. Maximum score is 20. A value of 0 denotes this part of Folstein MMSE was not completed or the patient failed this part of the Mini-Cog screening.   Mini-Cog Screening Orientation to Time - Max 5 pts Orientation to Place - Max 5 pts Registration - Max 3 pts Recall - Max 3 pts Language Repeat - Max 1 pts Language Follow 3 Step Command - Max 3 pts     Immunization History  Administered Date(s) Administered  . Influenza, High Dose Seasonal PF 05/10/2016, 05/07/2017  . Influenza,inj,Quad PF,6+ Mos 05/22/2015  . Influenza,trivalent, recombinat, inj, PF 04/26/2014  . Influenza-Unspecified 04/19/2012  . Pneumococcal Conjugate-13 04/12/2014  . Pneumococcal Polysaccharide-23 08/19/2004  . Td 08/20/1995    Screening Tests Health Maintenance  Topic Date Due  . OPHTHALMOLOGY EXAM  03/18/2018 (Originally 01/31/2018)  . DTaP/Tdap/Td (1 - Tdap) 02/17/2019 (Originally 08/21/1995)  . TETANUS/TDAP  02/17/2019 (Originally 08/19/2005)  . FOOT EXAM  03/14/2018  . INFLUENZA VACCINE  03/19/2018  . HEMOGLOBIN A1C  05/17/2018  . PNA vac Low Risk Adult  Completed       Plan:     I have personally reviewed, addressed, and noted the following in the patient's chart:  A. Medical and social history B. Use of alcohol, tobacco or illicit drugs  C. Current medications and supplements D. Functional ability and status E.  Nutritional status F.  Physical activity G. Advance directives H. List of other physicians I.  Hospitalizations,  surgeries, and ER visits in previous 12 months J.  [place] to include hearing, vision, cognitive, depression L. Referrals and appointments - none  In addition, I have reviewed and discussed with patient certain preventive protocols, quality metrics, and best practice recommendations. A written personalized care plan for preventive services as well as general preventive health recommendations were provided to patient.  See attached scanned questionnaire for additional information.   Signed,   Lindell Noe, MHA, BS, LPN Health Coach

## 2018-02-16 NOTE — Progress Notes (Signed)
PCP notes:   Health maintenance:  Eye exam - per pt, future appt scheduled Jul 2019 Tetanus vaccine - postponed/insurance  Abnormal screenings:   Fall risk - hx of single fall Fall Risk  02/16/2018 02/27/2017 12/11/2016 08/07/2016 05/24/2016  Falls in the past year? Yes No No No Yes  Comment fell while walking down stairs; states he was trying to hold too much in his arms and lost balance - - - pt fell onto knees after tripping in parking lot  Number falls in past yr: 1 - - - 1  Injury with Fall? Yes - - - Yes  Risk Factor Category  - - - - -  Risk for fall due to : - - - - -  Risk for fall due to: Comment - - - - -  Follow up - - - - Falls evaluation completed;Falls prevention discussed   Mini-cog score: 19/20 MMSE - Mini Mental State Exam 02/16/2018 12/11/2016 05/24/2016  Orientation to time 5 5 5   Orientation to Place 5 5 5   Registration 3 3 3   Attention/ Calculation 0 0 0  Recall 3 3 1   Recall-comments - - pt was unable to recall 2 of 3 words  Language- name 2 objects 0 0 0  Language- repeat 1 1 1   Language- follow 3 step command 2 3 3   Language- follow 3 step command-comments unable to follow 1 step of 3 step command - -  Language- read & follow direction 0 0 0  Write a sentence 0 0 0  Copy design 0 0 0  Total score 19 20 18     Patient concerns:   None  Nurse concerns:  None  Next PCP appt:   02/24/18 @ 1515

## 2018-02-18 LAB — FRUCTOSAMINE: FRUCTOSAMINE: 263 umol/L (ref 190–270)

## 2018-02-24 ENCOUNTER — Ambulatory Visit (INDEPENDENT_AMBULATORY_CARE_PROVIDER_SITE_OTHER): Payer: Medicare Other | Admitting: Family Medicine

## 2018-02-24 ENCOUNTER — Encounter: Payer: Self-pay | Admitting: Family Medicine

## 2018-02-24 VITALS — BP 138/68 | HR 84 | Temp 98.5°F | Ht 66.25 in | Wt 225.2 lb

## 2018-02-24 DIAGNOSIS — C9000 Multiple myeloma not having achieved remission: Secondary | ICD-10-CM

## 2018-02-24 DIAGNOSIS — N401 Enlarged prostate with lower urinary tract symptoms: Secondary | ICD-10-CM | POA: Diagnosis not present

## 2018-02-24 DIAGNOSIS — D4620 Refractory anemia with excess of blasts, unspecified: Secondary | ICD-10-CM

## 2018-02-24 DIAGNOSIS — E785 Hyperlipidemia, unspecified: Secondary | ICD-10-CM

## 2018-02-24 DIAGNOSIS — I10 Essential (primary) hypertension: Secondary | ICD-10-CM

## 2018-02-24 DIAGNOSIS — Z0000 Encounter for general adult medical examination without abnormal findings: Secondary | ICD-10-CM | POA: Diagnosis not present

## 2018-02-24 DIAGNOSIS — E1122 Type 2 diabetes mellitus with diabetic chronic kidney disease: Secondary | ICD-10-CM

## 2018-02-24 DIAGNOSIS — D61810 Antineoplastic chemotherapy induced pancytopenia: Secondary | ICD-10-CM | POA: Diagnosis not present

## 2018-02-24 DIAGNOSIS — R011 Cardiac murmur, unspecified: Secondary | ICD-10-CM

## 2018-02-24 DIAGNOSIS — C9141 Hairy cell leukemia, in remission: Secondary | ICD-10-CM

## 2018-02-24 DIAGNOSIS — N183 Chronic kidney disease, stage 3 (moderate): Secondary | ICD-10-CM

## 2018-02-24 DIAGNOSIS — E11319 Type 2 diabetes mellitus with unspecified diabetic retinopathy without macular edema: Secondary | ICD-10-CM | POA: Diagnosis not present

## 2018-02-24 DIAGNOSIS — Z794 Long term (current) use of insulin: Secondary | ICD-10-CM

## 2018-02-24 DIAGNOSIS — E538 Deficiency of other specified B group vitamins: Secondary | ICD-10-CM

## 2018-02-24 DIAGNOSIS — T451X5A Adverse effect of antineoplastic and immunosuppressive drugs, initial encounter: Secondary | ICD-10-CM

## 2018-02-24 DIAGNOSIS — N138 Other obstructive and reflux uropathy: Secondary | ICD-10-CM

## 2018-02-24 LAB — POCT GLYCOSYLATED HEMOGLOBIN (HGB A1C): HEMOGLOBIN A1C: 6.3 % — AB (ref 4.0–5.6)

## 2018-02-24 MED ORDER — INSULIN REGULAR HUMAN 100 UNIT/ML IJ SOLN
10.0000 [IU] | Freq: Two times a day (BID) | INTRAMUSCULAR | 5 refills | Status: DC
Start: 2018-02-24 — End: 2019-03-22

## 2018-02-24 NOTE — Assessment & Plan Note (Signed)
Preventative protocols reviewed and updated unless pt declined. Discussed healthy diet and lifestyle.  

## 2018-02-24 NOTE — Progress Notes (Signed)
BP 138/68 (BP Location: Left Arm, Patient Position: Sitting, Cuff Size: Normal)   Pulse 84   Temp 98.5 F (36.9 C) (Oral)   Ht 5' 6.25" (1.683 m)   Wt 225 lb 4 oz (102.2 kg)   SpO2 95%   BMI 36.08 kg/m    CC: CPE Subjective:    Patient ID: Alex Smith, male    DOB: 1935/01/27, 83 y.o.   MRN: [id number]  HPI: Alex Smith is a 83 y.o. male presenting on 02/24/2018 for Annual Exam (Pt 2.)   Saw Katha Cabal last week for medicare wellness visit. Note reviewed.    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   Diarrhea has improved with PRN imodium. Did not complete c diff testing.   Brings sugar log for 2019 - fasting in the morning ranging from 80s-200, averaging 130. Lab Results  Component Value Date   HGBA1C 6.3 (A) 02/24/2018    Echo 05/2017 - EF 65-70%, normal wall motion, G1DD, mildly dilated LA  Preventative: Colonoscopy 2014 - Dr. Tiffany Kocher at [facility]. Prostate cancer screening - aged out. Released from Dr. Dene Gentry care for BPH as he was doing well. Denies urinary issues.  Flu shot - yearly Pneumovax 2006, prevnar 2016. Td 1997  Zzostavax - declines. Currently on acyclovir. Would want to check with onc re: this. Advanced directives: discussed, has not set up, packet provided last visit, 5 wishes today. Would want wife then son to be HCPOA. Full code, ok for temporary measures but doesn't want prolonged life support. Is organ donor.  Seat belt use discussed Sunscreen use discussed. No changing moles on skin. Ex smoker - quit 1970. Alcohol - seldom Dentist Q6 mo Eye exam yearly  Married >50 yrs, cares for wife. 1 cat  Occupation: was Art gallery manager, works part time for [employer]  Activity: likes to golf  Diet: moderate water, fruits/vegetables daily   Relevant past medical, surgical, family and social history reviewed and updated as indicated. Interim medical history since our last visit  reviewed. Allergies and medications reviewed and updated. Outpatient Medications Prior to Visit  Medication Sig Dispense Refill  . ACCU-CHEK AVIVA PLUS test strip USE TO CHECK SUGAR TWICE DAILY 200 each 0  . acyclovir (ZOVIRAX) 400 MG tablet TAKE TWO TABLETS BY MOUTH TWICE DAILY 120 tablet 3  . aspirin EC 81 MG tablet Take 81 mg by mouth daily.    Marland Kitchen doxazosin (CARDURA) 1 MG tablet Take 1 tablet (1 mg total) by mouth daily. 30 tablet 6  . hydrochlorothiazide (HYDRODIURIL) 25 MG tablet TAKE ONE TABLET BY MOUTH EVERY DAY 30 tablet 0  . lidocaine-prilocaine (EMLA) cream Apply 1 application topically as needed. 30 g 3  . lisinopril (PRINIVIL,ZESTRIL) 20 MG tablet TAKE ONE TABLET BY MOUTH TWICE DAILY 60 tablet 0  . metFORMIN (GLUCOPHAGE) 1000 MG tablet TAKE ONE TABLET TWICE DAILY 180 tablet 1  . MICROLET LANCETS MISC USE AS DIRECTED 200 each 3  . pravastatin (PRAVACHOL) 20 MG tablet TAKE ONE TABLET BY MOUTH AT BEDTIME 30 tablet 2  . ranitidine (ZANTAC) 150 MG tablet TAKE ONE TABLET BY MOUTH TWICE DAILY AS NEEDED 180 tablet 0  . tamsulosin (FLOMAX) 0.4 MG CAPS capsule TAKE 1 CAPSULE BY MOUTH EVERY DAY 30 capsule 3  . traZODone (DESYREL) 50 MG tablet TAKE 1/2 TO 1 TABLET BY MOUTH AT BEDTIMEAS NEEDED FOR SLEEP 30 tablet 0  . vitamin E 400 UNIT capsule Take 200 Units  by mouth daily.     Marland Kitchen HUMULIN N 100 UNIT/ML injection INJECT 39 UNITS SUBCUTANEOUSLY TWICE DAILY BEFORE A MEAL 20 mL 6  . insulin regular (HUMULIN R) 100 units/mL injection Inject 0.1 mLs (10 Units total) into the skin 2 (two) times daily before a meal. (33 units daily) 10 mL 5  . temazepam (RESTORIL) 7.5 MG capsule Take 7.5 mg by mouth once.      Facility-Administered Medications Prior to Visit  Medication Dose Route Frequency Provider Last Rate Last Dose  . heparin lock flush 100 unit/mL  500 Units Intravenous Once Corcoran, Melissa C, MD      . sodium chloride 0.9 % injection 10 mL  10 mL Intravenous PRN Lequita Asal, MD   10 mL  at 03/03/15 0903  . sodium chloride 0.9 % injection 10 mL  10 mL Intracatheter PRN Nolon Stalls C, MD   10 mL at 03/10/15 1410  . Tbo-Filgrastim (GRANIX) injection 480 mcg  480 mcg Subcutaneous Once Lequita Asal, MD         Per HPI unless specifically indicated in ROS section below Review of Systems  Constitutional: Negative for activity change, appetite change, chills, fatigue, fever and unexpected weight change.  HENT: Negative for hearing loss.   Eyes: Negative for visual disturbance.  Respiratory: Positive for shortness of breath (attributes to anemia). Negative for cough, chest tightness and wheezing.   Cardiovascular: Negative for chest pain, palpitations and leg swelling.  Gastrointestinal: Positive for diarrhea (intermittent). Negative for abdominal distention, abdominal pain, blood in stool, constipation, nausea and vomiting.  Genitourinary: Negative for difficulty urinating and hematuria.  Musculoskeletal: Negative for arthralgias, myalgias and neck pain.  Skin: Negative for rash.  Neurological: Negative for dizziness, seizures, syncope and headaches.  Hematological: Negative for adenopathy. Bruises/bleeds easily.  Psychiatric/Behavioral: Negative for dysphoric mood. The patient is not nervous/anxious.        Objective:    BP 138/68 (BP Location: Left Arm, Patient Position: Sitting, Cuff Size: Normal)   Pulse 84   Temp 98.5 F (36.9 C) (Oral)   Ht 5' 6.25" (1.683 m)   Wt 225 lb 4 oz (102.2 kg)   SpO2 95%   BMI 36.08 kg/m   Wt Readings from Last 3 Encounters:  02/24/18 225 lb 4 oz (102.2 kg)  02/16/18 223 lb 4 oz (101.3 kg)  01/19/18 221 lb 5 oz (100.4 kg)    Physical Exam  Constitutional: He is oriented to person, place, and time. He appears well-developed and well-nourished. No distress.  HENT:  Head: Normocephalic and atraumatic.  Right Ear: Hearing, tympanic membrane, external ear and ear canal normal.  Left Ear: Hearing, tympanic membrane, external  ear and ear canal normal.  Nose: Nose normal.  Mouth/Throat: Uvula is midline, oropharynx is clear and moist and mucous membranes are normal. No oropharyngeal exudate, posterior oropharyngeal edema or posterior oropharyngeal erythema.  Eyes: Pupils are equal, round, and reactive to light. Conjunctivae and EOM are normal. No scleral icterus.  Neck: Normal range of motion. Neck supple. Carotid bruit is not present. No thyromegaly present.  Cardiovascular: Normal rate, regular rhythm and intact distal pulses.  Murmur (3/6 systolic) heard. Pulses:      Radial pulses are 2+ on the right side, and 2+ on the left side.  Pulmonary/Chest: Effort normal and breath sounds normal. No respiratory distress. He has no wheezes. He has no rales.  Abdominal: Soft. Bowel sounds are normal. He exhibits no distension and no mass. There is no  tenderness. There is no rebound and no guarding.  Musculoskeletal: Normal range of motion. He exhibits no edema.  Lymphadenopathy:    He has no cervical adenopathy.  Neurological: He is alert and oriented to person, place, and time.  CN grossly intact, station and gait intact  Skin: Skin is warm and dry. No rash noted.  Psychiatric: He has a normal mood and affect. His behavior is normal. Judgment and thought content normal.  Nursing note and vitals reviewed.  Results for orders placed or performed in visit on 02/24/18  POCT glycosylated hemoglobin (Hb A1C)  Result Value Ref Range   Hemoglobin A1C 6.3 (A) 4.0 - 5.6 %   HbA1c POC (<> result, manual entry)  4.0 - 5.6 %   HbA1c, POC (prediabetic range)  5.7 - 6.4 %   HbA1c, POC (controlled diabetic range)  0.0 - 7.0 %   Lab Results  Component Value Date   HGBA1C 6.3 (A) 02/24/2018   Lab Results  Component Value Date   CREATININE 1.61 (H) 01/19/2018   BUN 33 (H) 01/19/2018   NA 136 01/19/2018   K 4.3 01/19/2018   CL 106 01/19/2018   CO2 21 (L) 01/19/2018    Lab Results  Component Value Date   VITAMINB12 308  01/19/2018       Assessment & Plan:   Problem List Items Addressed This Visit    Type 2 diabetes, controlled, with retinopathy ([place])    Overall stable readings by log, A1c and fructosamine levels. Some highs, however given good average, will continue current regimen. No longer on steroid.        Relevant Medications   insulin regular (HUMULIN R) 100 units/mL injection   Other Relevant Orders   POCT glycosylated hemoglobin (Hb A1C) (Completed)   Systolic murmur    Unrevealing echo 05/2017      Severe obesity (BMI 35.0-39.9) with comorbidity ([place])    Encouraged ongoing healthy diet choices      Relevant Medications   insulin regular (HUMULIN R) 100 units/mL injection   Pancytopenia due to antineoplastic chemotherapy ([place])    Ongoing, followed closely by oncology      Relevant Medications   vitamin B-12 (CYANOCOBALAMIN) 1000 MCG tablet   Myelodysplastic syndrome, low grade ([place])    Has seen [facility] specialist (Dr Evelene Croon). Plan per onc is transfusions to maintain a hemoglobin > 9g/dL      Multiple myeloma not having achieved remission (HCC)    Stable period.       Health maintenance examination - Primary    Preventative protocols reviewed and updated unless pt declined. Discussed healthy diet and lifestyle.       Hairy cell leukemia, in remission ([place])    Stable period.      Essential hypertension    Chronic, stable continue current regimen.       Dyslipidemia    LDL remains at goal. Triglycerides remain markedly elevated. Reviewed diet choices to achieve better triglyceride control. Consider fish oil (brand or OTC).       CKD stage 3 due to type 2 diabetes mellitus (HCC)    Baseline Cr 1.2-1.4. Recent elevation merits close monitoring.       Relevant Medications   insulin regular (HUMULIN R) 100 units/mL injection   BPH with obstruction/lower urinary tract symptoms    On flomax and cardura - will discuss trial off one.       B12 deficiency    I don't see  where he's been taking  b12 - recommend restart this.           Meds ordered this encounter  Medications  . insulin regular (HUMULIN R) 100 units/mL injection    Sig: Inject 0.1 mLs (10 Units total) into the skin 2 (two) times daily before a meal.    Dispense:  10 mL    Refill:  5    FOR NEXT FILL PUT ON FILE  . vitamin B-12 (CYANOCOBALAMIN) 1000 MCG tablet    Sig: Take 1 tablet (1,000 mcg total) by mouth daily.   Orders Placed This Encounter  Procedures  . POCT glycosylated hemoglobin (Hb A1C)    Follow up plan: Return in about 6 months (around 08/27/2018) for follow up visit.  Ria Bush, MD

## 2018-02-24 NOTE — Patient Instructions (Addendum)
Continue current medicines Return as needed orin 6 months for follow up visit.  Decrease added sugars, eliminate trans fats, increase fiber and limit alcohol. Increase fatty fish in diet. All these changes together can drop triglycerides by almost 50%. Health Maintenance, Male A healthy lifestyle and preventive care is important for your health and wellness. Ask your health care provider about what schedule of regular examinations is right for you. What should I know about weight and diet? Eat a Healthy Diet  Eat plenty of vegetables, fruits, whole grains, low-fat dairy products, and lean protein.  Do not eat a lot of foods high in solid fats, added sugars, or salt.  Maintain a Healthy Weight Regular exercise can help you achieve or maintain a healthy weight. You should:  Do at least 150 minutes of exercise each week. The exercise should increase your heart rate and make you sweat (moderate-intensity exercise).  Do strength-training exercises at least twice a week.  Watch Your Levels of Cholesterol and Blood Lipids  Have your blood tested for lipids and cholesterol every 5 years starting at 81 years of age. If you are at high risk for heart disease, you should start having your blood tested when you are 81 years old. You may need to have your cholesterol levels checked more often if: ? Your lipid or cholesterol levels are high. ? You are older than 81 years of age. ? You are at high risk for heart disease.  What should I know about cancer screening? Many types of cancers can be detected early and may often be prevented. Lung Cancer  You should be screened every year for lung cancer if: ? You are a current smoker who has smoked for at least 30 years. ? You are a former smoker who has quit within the past 15 years.  Talk to your health care provider about your screening options, when you should start screening, and how often you should be screened.  Colorectal Cancer  Routine  colorectal cancer screening usually begins at 81 years of age and should be repeated every 5-10 years until you are 81 years old. You may need to be screened more often if early forms of precancerous polyps or small growths are found. Your health care provider may recommend screening at an earlier age if you have risk factors for colon cancer.  Your health care provider may recommend using home test kits to check for hidden blood in the stool.  A small camera at the end of a tube can be used to examine your colon (sigmoidoscopy or colonoscopy). This checks for the earliest forms of colorectal cancer.  Prostate and Testicular Cancer  Depending on your age and overall health, your health care provider may do certain tests to screen for prostate and testicular cancer.  Talk to your health care provider about any symptoms or concerns you have about testicular or prostate cancer.  Skin Cancer  Check your skin from head to toe regularly.  Tell your health care provider about any new moles or changes in moles, especially if: ? There is a change in a mole's size, shape, or color. ? You have a mole that is larger than a pencil eraser.  Always use sunscreen. Apply sunscreen liberally and repeat throughout the day.  Protect yourself by wearing long sleeves, pants, a wide-brimmed hat, and sunglasses when outside.  What should I know about heart disease, diabetes, and high blood pressure?  If you are 52-72 years of age, have your blood  pressure checked every 3-5 years. If you are 72 years of age or older, have your blood pressure checked every year. You should have your blood pressure measured twice-once when you are at a hospital or clinic, and once when you are not at a hospital or clinic. Record the average of the two measurements. To check your blood pressure when you are not at a hospital or clinic, you can use: ? An automated blood pressure machine at a pharmacy. ? A home blood pressure  monitor.  Talk to your health care provider about your target blood pressure.  If you are between 67-59 years old, ask your health care provider if you should take aspirin to prevent heart disease.  Have regular diabetes screenings by checking your fasting blood sugar level. ? If you are at a normal weight and have a low risk for diabetes, have this test once every three years after the age of 17. ? If you are overweight and have a high risk for diabetes, consider being tested at a younger age or more often.  A one-time screening for abdominal aortic aneurysm (AAA) by ultrasound is recommended for men aged 61-75 years who are current or former smokers. What should I know about preventing infection? Hepatitis B If you have a higher risk for hepatitis B, you should be screened for this virus. Talk with your health care provider to find out if you are at risk for hepatitis B infection. Hepatitis C Blood testing is recommended for:  Everyone born from [birth year] through 1963.  Anyone with known risk factors for hepatitis C.  Sexually Transmitted Diseases (STDs)  You should be screened each year for STDs including gonorrhea and chlamydia if: ? You are sexually active and are younger than 81 years of age. ? You are older than 81 years of age and your health care provider tells you that you are at risk for this type of infection. ? Your sexual activity has changed since you were last screened and you are at an increased risk for chlamydia or gonorrhea. Ask your health care provider if you are at risk.  Talk with your health care provider about whether you are at high risk of being infected with HIV. Your health care provider may recommend a prescription medicine to help prevent HIV infection.  What else can I do?  Schedule regular health, dental, and eye exams.  Stay current with your vaccines (immunizations).  Do not use any tobacco products, such as cigarettes, chewing tobacco, and  e-cigarettes. If you need help quitting, ask your health care provider.  Limit alcohol intake to no more than 2 drinks per day. One drink equals 12 ounces of beer, 5 ounces of wine, or 1 ounces of hard liquor.  Do not use street drugs.  Do not share needles.  Ask your health care provider for help if you need support or information about quitting drugs.  Tell your health care provider if you often feel depressed.  Tell your health care provider if you have ever been abused or do not feel safe at home. This information is not intended to replace advice given to you by your health care provider. Make sure you discuss any questions you have with your health care provider. Document Released: 02/01/2008 Document Revised: 04/03/2016 Document Reviewed: 05/09/2015 Elsevier Interactive Patient Education  [employer].

## 2018-02-26 ENCOUNTER — Other Ambulatory Visit: Payer: Self-pay | Admitting: Family Medicine

## 2018-02-27 ENCOUNTER — Other Ambulatory Visit: Payer: Self-pay | Admitting: Family Medicine

## 2018-02-27 NOTE — Telephone Encounter (Signed)
° ° °  Pt said he is out of this med and req that it be filled today

## 2018-02-28 ENCOUNTER — Telehealth: Payer: Self-pay | Admitting: Family Medicine

## 2018-02-28 MED ORDER — VITAMIN B-12 1000 MCG PO TABS: 1000.0000 ug | ORAL_TABLET | Freq: Every day | ORAL | Status: DC

## 2018-02-28 NOTE — Assessment & Plan Note (Signed)
Unrevealing echo 05/2017

## 2018-02-28 NOTE — Assessment & Plan Note (Signed)
LDL remains at goal. Triglycerides remain markedly elevated. Reviewed diet choices to achieve better triglyceride control. Consider fish oil (brand or OTC).

## 2018-02-28 NOTE — Assessment & Plan Note (Signed)
I don't see where he's been taking b12 - recommend restart this.

## 2018-02-28 NOTE — Assessment & Plan Note (Signed)
Stable period.  

## 2018-02-28 NOTE — Telephone Encounter (Signed)
plz call to ask below: 1. Is he still taking vit B12 1000 mcg daily? If not I may suggest he restart. 2. He's on both cardura and flomax - both are similar medications. As BP is improved on cardura I recommend we discontinue flomax and monitor urinary symptoms only on cardura.

## 2018-02-28 NOTE — Assessment & Plan Note (Signed)
Overall stable readings by log, A1c and fructosamine levels. Some highs, however given good average, will continue current regimen. No longer on steroid.

## 2018-02-28 NOTE — Assessment & Plan Note (Signed)
Chronic, stable continue current regimen.  

## 2018-02-28 NOTE — Assessment & Plan Note (Signed)
On flomax and cardura - will discuss trial off one.

## 2018-02-28 NOTE — Assessment & Plan Note (Signed)
Ongoing, followed closely by oncology.  

## 2018-02-28 NOTE — Assessment & Plan Note (Signed)
Encouraged ongoing healthy diet choices

## 2018-02-28 NOTE — Assessment & Plan Note (Signed)
Baseline Cr 1.2-1.4. Recent elevation merits close monitoring.

## 2018-02-28 NOTE — Assessment & Plan Note (Addendum)
Has seen [facility] specialist (Dr Evelene Croon). Plan per onc is transfusions to maintain a hemoglobin > 9g/dL

## 2018-03-02 NOTE — Progress Notes (Signed)
I reviewed health advisor's note, was available for consultation, and agree with documentation and plan.  

## 2018-03-02 NOTE — Telephone Encounter (Addendum)
Left message for pt to call back.   Need to document response to Dr. Synthia Innocent question and relay instructions.

## 2018-03-02 NOTE — Telephone Encounter (Deleted)
plz call to ask below:

## 2018-03-03 ENCOUNTER — Other Ambulatory Visit: Payer: Self-pay | Admitting: Family Medicine

## 2018-03-03 MED ORDER — VITAMIN B-12 1000 MCG PO TABS: 1000.0000 ug | ORAL_TABLET | Freq: Every day | ORAL | 1 refills | Status: DC

## 2018-03-03 NOTE — Telephone Encounter (Signed)
Noted  

## 2018-03-03 NOTE — Telephone Encounter (Signed)
Patient called back and ask if Dr. Danise Mina will prescribe the Vitamin B12? I advised I will send this over to him.  Pharmacy: [place], [place] - [address] [phone] (Phone) [phone] (Fax)

## 2018-03-03 NOTE — Telephone Encounter (Addendum)
Pt given advice per notes of Dr. Danise Mina on 02/28/18 below. Pt verbalized understanding. He says he was not taking Vitamin B12, but will buy it OTC and start.

## 2018-03-03 NOTE — Telephone Encounter (Signed)
Sent in

## 2018-03-03 NOTE — Addendum Note (Signed)
Addended by: Ria Bush on: 03/03/2018 05:22 PM   Modules accepted: Orders

## 2018-03-03 NOTE — Telephone Encounter (Signed)
Left message for pt to call back.  Need to document response to Dr. Synthia Innocent question and relay instructions.

## 2018-03-10 ENCOUNTER — Other Ambulatory Visit: Payer: Self-pay | Admitting: Family Medicine

## 2018-03-20 ENCOUNTER — Telehealth: Payer: Self-pay | Admitting: *Deleted

## 2018-03-20 NOTE — Telephone Encounter (Signed)
Patient called this morning and left message to have nurse call him.  I called back and patient stated he had received a call.  He was wanting to get scheduled for port flush.

## 2018-03-20 NOTE — Telephone Encounter (Signed)
-----   Message from Donnita Falls, RN sent at 03/20/2018  8:28 AM EDT ----- He called and left a message and said that he needs to speak to a nurse  Please call him   Thanks,

## 2018-03-20 NOTE — Telephone Encounter (Signed)
Thinks his port needs to be flushed. Appointment given and accepted for Monday as his last flush was in April

## 2018-03-23 ENCOUNTER — Inpatient Hospital Stay: Payer: Medicare Other | Attending: Hematology and Oncology

## 2018-03-23 DIAGNOSIS — Z95828 Presence of other vascular implants and grafts: Secondary | ICD-10-CM

## 2018-03-23 DIAGNOSIS — D469 Myelodysplastic syndrome, unspecified: Secondary | ICD-10-CM | POA: Insufficient documentation

## 2018-03-23 DIAGNOSIS — Z452 Encounter for adjustment and management of vascular access device: Secondary | ICD-10-CM | POA: Insufficient documentation

## 2018-03-23 MED ORDER — SODIUM CHLORIDE 0.9% FLUSH
10.0000 mL | Freq: Once | INTRAVENOUS | Status: AC
  Administered 2018-03-23: 10 mL via INTRAVENOUS
  Filled 2018-03-23: qty 10

## 2018-03-23 MED ORDER — HEPARIN SOD (PORK) LOCK FLUSH 100 UNIT/ML IV SOLN
500.0000 [IU] | Freq: Once | INTRAVENOUS | Status: AC
  Administered 2018-03-23: 500 [IU] via INTRAVENOUS

## 2018-03-30 DIAGNOSIS — L570 Actinic keratosis: Secondary | ICD-10-CM | POA: Diagnosis not present

## 2018-03-30 DIAGNOSIS — L820 Inflamed seborrheic keratosis: Secondary | ICD-10-CM | POA: Diagnosis not present

## 2018-03-30 DIAGNOSIS — C4491 Basal cell carcinoma of skin, unspecified: Secondary | ICD-10-CM

## 2018-03-30 DIAGNOSIS — D229 Melanocytic nevi, unspecified: Secondary | ICD-10-CM | POA: Diagnosis not present

## 2018-03-30 DIAGNOSIS — C44311 Basal cell carcinoma of skin of nose: Secondary | ICD-10-CM | POA: Diagnosis not present

## 2018-03-30 DIAGNOSIS — D485 Neoplasm of uncertain behavior of skin: Secondary | ICD-10-CM | POA: Diagnosis not present

## 2018-03-30 DIAGNOSIS — Z1283 Encounter for screening for malignant neoplasm of skin: Secondary | ICD-10-CM | POA: Diagnosis not present

## 2018-03-30 HISTORY — DX: Basal cell carcinoma of skin, unspecified: C44.91

## 2018-04-08 ENCOUNTER — Encounter: Payer: Self-pay | Admitting: Radiation Oncology

## 2018-04-08 ENCOUNTER — Ambulatory Visit
Admission: RE | Admit: 2018-04-08 | Discharge: 2018-04-08 | Disposition: A | Discharge status: Home / Self Care | Payer: Medicare Other | Source: Ambulatory Visit | Attending: Radiation Oncology | Admitting: Radiation Oncology

## 2018-04-08 ENCOUNTER — Other Ambulatory Visit: Payer: Self-pay

## 2018-04-08 DIAGNOSIS — E785 Hyperlipidemia, unspecified: Secondary | ICD-10-CM | POA: Diagnosis not present

## 2018-04-08 DIAGNOSIS — Z8719 Personal history of other diseases of the digestive system: Secondary | ICD-10-CM | POA: Diagnosis not present

## 2018-04-08 DIAGNOSIS — D649 Anemia, unspecified: Secondary | ICD-10-CM | POA: Diagnosis not present

## 2018-04-08 DIAGNOSIS — Z79899 Other long term (current) drug therapy: Secondary | ICD-10-CM | POA: Insufficient documentation

## 2018-04-08 DIAGNOSIS — I129 Hypertensive chronic kidney disease with stage 1 through stage 4 chronic kidney disease, or unspecified chronic kidney disease: Secondary | ICD-10-CM | POA: Insufficient documentation

## 2018-04-08 DIAGNOSIS — E1122 Type 2 diabetes mellitus with diabetic chronic kidney disease: Secondary | ICD-10-CM | POA: Diagnosis not present

## 2018-04-08 DIAGNOSIS — I10 Essential (primary) hypertension: Secondary | ICD-10-CM | POA: Diagnosis not present

## 2018-04-08 DIAGNOSIS — N183 Chronic kidney disease, stage 3 (moderate): Secondary | ICD-10-CM | POA: Diagnosis not present

## 2018-04-08 DIAGNOSIS — Z85828 Personal history of other malignant neoplasm of skin: Secondary | ICD-10-CM | POA: Insufficient documentation

## 2018-04-08 DIAGNOSIS — K219 Gastro-esophageal reflux disease without esophagitis: Secondary | ICD-10-CM | POA: Insufficient documentation

## 2018-04-08 DIAGNOSIS — N400 Enlarged prostate without lower urinary tract symptoms: Secondary | ICD-10-CM | POA: Insufficient documentation

## 2018-04-08 DIAGNOSIS — Z7982 Long term (current) use of aspirin: Secondary | ICD-10-CM | POA: Insufficient documentation

## 2018-04-08 DIAGNOSIS — Z87891 Personal history of nicotine dependence: Secondary | ICD-10-CM | POA: Diagnosis not present

## 2018-04-08 DIAGNOSIS — E538 Deficiency of other specified B group vitamins: Secondary | ICD-10-CM | POA: Diagnosis not present

## 2018-04-08 DIAGNOSIS — Z86010 Personal history of colonic polyps: Secondary | ICD-10-CM | POA: Insufficient documentation

## 2018-04-08 DIAGNOSIS — Z8589 Personal history of malignant neoplasm of other organs and systems: Secondary | ICD-10-CM | POA: Diagnosis not present

## 2018-04-08 DIAGNOSIS — Z794 Long term (current) use of insulin: Secondary | ICD-10-CM | POA: Insufficient documentation

## 2018-04-08 DIAGNOSIS — C44311 Basal cell carcinoma of skin of nose: Secondary | ICD-10-CM | POA: Diagnosis not present

## 2018-04-08 NOTE — Consult Note (Signed)
NEW PATIENT EVALUATION  Name: Alex Smith  MRN: [id number]  Date:   04/08/2018     DOB: 10-Oct-1934   This 83 y.o. male patient presents to the clinic for initial evaluation of basal cell carcinoma of the left nasal Alla  REFERRING PHYSICIAN: Ria Bush, MD  CHIEF COMPLAINT:  Chief Complaint  Patient presents with  . Skin Cancer    intial eval    DIAGNOSIS: The encounter diagnosis was Basal cell carcinoma of nose.   PREVIOUS INVESTIGATIONS:  Pathology reports reviewed Clinical notes reviewed  HPI: patient is a 83 year old male with a history of precancerous actinic keratoses as well as a recently biopsied basal cell carcinoma of his left nasal ala. He is otherwise asymptomatic. Treatment options have been discussed with the patient he is seen today for radiation oncology consultation. He is asymptomatic. He does have a history of prior excisions of his nose and has a small lesion On the tip of his nose and an area of previous excision.  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    PAST SURGICAL HISTORY:  Past Surgical History:  Procedure Laterality Date  . [place] VITRECTOMY WITH 20 GAUGE MVR PORT FOR MACULAR HOLE  07/07/2012   Procedure: 25 GAUGE PARS PLANA VITRECTOMY WITH 20 GAUGE MVR PORT FOR MACULAR HOLE;  Surgeon: Hayden Pedro, MD;  Location: [place];  Service: Ophthalmology;   Laterality: Left;  . BONE MARROW BIOPSY  2016  . CARDIOVASCULAR STRESS TEST  2013   treadmill - no evidence ischemia, EF 61%  . CATARACT EXTRACTION W/ INTRAOCULAR LENS  IMPLANT, BILATERAL  ~ 2010  . COLONOSCOPY  2014   [place] WNL no rpt needed, h/o polyps  . EYE SURGERY Left 06/2012   laser surgery  . GAS INSERTION  07/07/2012   Procedure: INSERTION OF GAS;  Surgeon: Hayden Pedro, MD;  Location: [facility] [facility];  Service: Ophthalmology;  Laterality: Left;  C3F8  . PERIPHERAL VASCULAR CATHETERIZATION N/A 02/23/2015   Procedure: [place]i [place] Cath Insertion;  Surgeon: Algernon Huxley, MD;  Location: [place] [facility];  Service: Cardiovascular;  Laterality: N/A;  . SERUM PATCH  07/07/2012   Procedure: SERUM PATCH;  Surgeon: Hayden Pedro, MD;  Location: [facility];  Service: Ophthalmology;  Laterality: Left;  . SKIN CANCER EXCISION     "all over my face" (07/07/2012)    FAMILY HISTORY: family history includes CAD (age of onset: 71) in his brother; Cancer in his sister; Dementia in his mother; Diabetes in his paternal aunt and paternal uncle; Heart failure (age of onset: 22) in his father.  SOCIAL HISTORY:  reports that he quit smoking about 49 years ago. His smoking use included cigarettes. He has a 50.00 pack-year smoking history. He has never used smokeless tobacco. He reports that he drinks alcohol. He reports that he does not use drugs.  ALLERGIES: Rituximab; Blood-group specific substance; Primaxin [imipenem]; Voriconazole; Sulfa antibiotics; and Sulfacetamide sodium  MEDICATIONS:  Current Outpatient Medications  Medication Sig Dispense Refill  . ACCU-CHEK AVIVA PLUS test  strip USE TO CHECK SUGAR TWICE DAILY 200 each 0  . acyclovir (ZOVIRAX) 400 MG tablet TAKE TWO TABLETS BY MOUTH TWICE DAILY 120 tablet 3  . aspirin EC 81 MG tablet Take 81 mg by mouth daily.    Marland Kitchen doxazosin (CARDURA) 1 MG tablet Take 1 tablet (1 mg total) by mouth daily. 30 tablet 6  . HUMULIN N 100 UNIT/ML injection INJECT 39 UNITS  SUB-Q TWICE DAILY BEFOREA MEAL 20 mL 6  . hydrochlorothiazide (HYDRODIURIL) 25 MG tablet TAKE ONE TABLET BY MOUTH EVERY DAY 30 tablet 5  . insulin regular (HUMULIN R) 100 units/mL injection Inject 0.1 mLs (10 Units total) into the skin 2 (two) times daily before a meal. 10 mL 5  . lidocaine-prilocaine (EMLA) cream Apply 1 application topically as needed. 30 g 3  . lisinopril (PRINIVIL,ZESTRIL) 20 MG tablet TAKE ONE TABLET BY MOUTH TWICE DAILY 60 tablet 5  . metFORMIN (GLUCOPHAGE) 1000 MG tablet TAKE ONE TABLET TWICE DAILY 180 tablet 3  . MICROLET LANCETS MISC USE AS DIRECTED 200 each 3  . pravastatin (PRAVACHOL) 20 MG tablet TAKE 1 TABLET BY MOUTH NIGHTLY 30 tablet 5  . ranitidine (ZANTAC) 150 MG tablet TAKE ONE TABLET BY MOUTH TWICE DAILY AS NEEDED 180 tablet 0  . tamsulosin (FLOMAX) 0.4 MG CAPS capsule TAKE 1 CAPSULE BY MOUTH EVERY DAY 30 capsule 3  . traZODone (DESYREL) 50 MG tablet TAKE 1/2 TO 1 TABLET BY MOUTH AT BEDTIMEAS NEEDED FOR SLEEP 30 tablet 0  . vitamin B-12 (CYANOCOBALAMIN) 1000 MCG tablet Take 1 tablet (1,000 mcg total) by mouth daily. 90 tablet 1  . vitamin E 400 UNIT capsule Take 200 Units by mouth daily.      Current Facility-Administered Medications  Medication Dose Route Frequency Provider Last Rate Last Dose  . Tbo-Filgrastim (GRANIX) injection 480 mcg  480 mcg Subcutaneous Once Lequita Asal, MD       Facility-Administered Medications Ordered in Other Encounters  Medication Dose Route Frequency Provider Last Rate Last Dose  . heparin lock flush 100 unit/mL  500 Units Intravenous Once Corcoran, Melissa C, MD      . sodium chloride 0.9 % injection 10 mL  10 mL Intravenous PRN Lequita Asal, MD   10 mL at 03/03/15 0903  . sodium chloride 0.9 % injection 10 mL  10 mL Intracatheter PRN Nolon Stalls C, MD   10 mL at 03/10/15 1410    ECOG PERFORMANCE STATUS:  0 - Asymptomatic  REVIEW OF SYSTEMS:  Patient denies any weight loss, fatigue, weakness, fever,  chills or night sweats. Patient denies any loss of vision, blurred vision. Patient denies any ringing  of the ears or hearing loss. No irregular heartbeat. Patient denies heart murmur or history of fainting. Patient denies any chest pain or pain radiating to her upper extremities. Patient denies any shortness of breath, difficulty breathing at night, cough or hemoptysis. Patient denies any swelling in the lower legs. Patient denies any nausea vomiting, vomiting of blood, or coffee ground material in the vomitus. Patient denies any stomach pain. Patient states has had normal bowel movements no significant constipation or diarrhea. Patient denies any dysuria, hematuria or significant nocturia. Patient denies any problems walking, swelling in the joints or loss of balance. Patient denies any skin changes, loss of hair or loss of weight. Patient denies any excessive worrying or anxiety or significant depression. Patient denies any problems with insomnia. Patient denies excessive thirst, polyuria, polydipsia. Patient denies any swollen glands, patient  denies easy bruising or easy bleeding. Patient denies any recent infections, allergies or URI. Patient "s visual fields have not changed significantly in recent time.    PHYSICAL EXAM: BP (!) (P) 171/65 (BP Location: Left Arm, Patient Position: Sitting)   Pulse (P) 81   Temp (P) 97.8 F (36.6 C) (Tympanic)   Wt (P) 225 lb 6.7 oz (102.3 kg)   BMI (P) 36.11 kg/m  Patient is a small area of punch biopsy in the left nasalala. Also has a small seborrheic lesion on the tip of his nose an area of previous excision.Well-developed well-nourished patient in NAD. HEENT reveals PERLA, EOMI, discs not visualized.  Oral cavity is clear. No oral mucosal lesions are identified. Neck is clear without evidence of cervical or supraclavicular adenopathy. Lungs are clear to A&P. Cardiac examination is essentially unremarkable with regular rate and rhythm without murmur rub or thrill.  Abdomen is benign with no organomegaly or masses noted. Motor sensory and DTR levels are equal and symmetric in the upper and lower extremities. Cranial nerves II through XII are grossly intact. Proprioception is intact. No peripheral adenopathy or edema is identified. No motor or sensory levels are noted. Crude visual fields are within normal range.  LABORATORY DATA: pathology reports reviewed    RADIOLOGY RESULTS:no current films for review   IMPRESSION: basal cell carcinoma of left nasalala in 83 year old male  PLAN: at this time I to go ahead with electron beam radiation therapy. I will corporate the left nasal ala as well as the tip of his nose an area of previous excision and treat up to 5000 cGy over 5 weeks. Risks and benefits of treatment including skin reaction fatigue possible crusting of his nasal passage all were discussed in detail with the patient. He seems to comprehend my treatment plan well. I have personally set up and ordered CT simulation in about a week's time.  I would like to take this opportunity to thank you for allowing me to participate in the care of your patient.Noreene Filbert, MD

## 2018-04-09 ENCOUNTER — Institutional Professional Consult (permissible substitution): Payer: Medicare Other | Admitting: Radiation Oncology

## 2018-04-23 ENCOUNTER — Inpatient Hospital Stay: Payer: Medicare Other | Attending: Hematology and Oncology

## 2018-04-23 ENCOUNTER — Inpatient Hospital Stay: Payer: Medicare Other | Admitting: Hematology and Oncology

## 2018-04-23 ENCOUNTER — Other Ambulatory Visit: Payer: Self-pay | Admitting: Hematology and Oncology

## 2018-04-23 ENCOUNTER — Ambulatory Visit
Admission: RE | Admit: 2018-04-23 | Discharge: 2018-04-23 | Disposition: A | Discharge status: Home / Self Care | Payer: Medicare Other | Source: Ambulatory Visit | Attending: Radiation Oncology | Admitting: Radiation Oncology

## 2018-04-23 ENCOUNTER — Encounter: Payer: Self-pay | Admitting: Hematology and Oncology

## 2018-04-23 VITALS — BP 151/89 | HR 90 | Temp 98.2°F | Resp 18 | Wt 222.0 lb

## 2018-04-23 DIAGNOSIS — D4620 Refractory anemia with excess of blasts, unspecified: Secondary | ICD-10-CM

## 2018-04-23 DIAGNOSIS — D649 Anemia, unspecified: Secondary | ICD-10-CM | POA: Diagnosis not present

## 2018-04-23 DIAGNOSIS — Z87891 Personal history of nicotine dependence: Secondary | ICD-10-CM | POA: Diagnosis not present

## 2018-04-23 DIAGNOSIS — D472 Monoclonal gammopathy: Secondary | ICD-10-CM

## 2018-04-23 DIAGNOSIS — C9000 Multiple myeloma not having achieved remission: Secondary | ICD-10-CM | POA: Diagnosis not present

## 2018-04-23 DIAGNOSIS — D469 Myelodysplastic syndrome, unspecified: Secondary | ICD-10-CM

## 2018-04-23 DIAGNOSIS — E538 Deficiency of other specified B group vitamins: Secondary | ICD-10-CM | POA: Insufficient documentation

## 2018-04-23 DIAGNOSIS — C9141 Hairy cell leukemia, in remission: Secondary | ICD-10-CM | POA: Insufficient documentation

## 2018-04-23 DIAGNOSIS — E119 Type 2 diabetes mellitus without complications: Secondary | ICD-10-CM | POA: Diagnosis not present

## 2018-04-23 DIAGNOSIS — Z510 Encounter for antineoplastic radiation therapy: Secondary | ICD-10-CM | POA: Insufficient documentation

## 2018-04-23 DIAGNOSIS — C44311 Basal cell carcinoma of skin of nose: Secondary | ICD-10-CM | POA: Diagnosis not present

## 2018-04-23 LAB — COMPREHENSIVE METABOLIC PANEL
ALT: 24 U/L (ref 0–44)
AST: 39 U/L (ref 15–41)
Albumin: 4.2 g/dL (ref 3.5–5.0)
Alkaline Phosphatase: 55 U/L (ref 38–126)
Anion gap: 8 (ref 5–15)
BUN: 31 mg/dL — ABNORMAL HIGH (ref 8–23)
CO2: 24 mmol/L (ref 22–32)
Calcium: 9.4 mg/dL (ref 8.9–10.3)
Chloride: 105 mmol/L (ref 98–111)
Creatinine, Ser: 1.62 mg/dL — ABNORMAL HIGH (ref 0.61–1.24)
GFR calc Af Amer: 44 mL/min — ABNORMAL LOW (ref >60)
GFR calc non Af Amer: 38 mL/min — ABNORMAL LOW (ref >60)
Glucose, Bld: 237 mg/dL — ABNORMAL HIGH (ref 70–99)
Potassium: 4.1 mmol/L (ref 3.5–5.1)
Sodium: 137 mmol/L (ref 135–145)
Total Bilirubin: 0.6 mg/dL (ref 0.3–1.2)
Total Protein: 7.6 g/dL (ref 6.5–8.1)

## 2018-04-23 LAB — CBC WITH DIFFERENTIAL/PLATELET
Basophils Absolute: 0 10*3/uL (ref 0–0.1)
Basophils Relative: 1 %
Eosinophils Absolute: 0.1 10*3/uL (ref 0–0.7)
Eosinophils Relative: 2 %
HCT: 26.8 % — ABNORMAL LOW (ref 40.0–52.0)
Hemoglobin: 9.4 g/dL — ABNORMAL LOW (ref 13.0–18.0)
Lymphocytes Relative: 24 %
Lymphs Abs: 0.8 10*3/uL — ABNORMAL LOW (ref 1.0–3.6)
MCH: 38.8 pg — ABNORMAL HIGH (ref 26.0–34.0)
MCHC: 35.1 g/dL (ref 32.0–36.0)
MCV: 110.4 fL — ABNORMAL HIGH (ref 80.0–100.0)
Monocytes Absolute: 0.2 10*3/uL (ref 0.2–1.0)
Monocytes Relative: 6 %
Neutro Abs: 2.1 10*3/uL (ref 1.4–6.5)
Neutrophils Relative %: 67 %
Platelets: 96 10*3/uL — ABNORMAL LOW (ref 150–440)
RBC: 2.43 MIL/uL — ABNORMAL LOW (ref 4.40–5.90)
RDW: 14.9 % — ABNORMAL HIGH (ref 11.5–14.5)
WBC: 3.2 10*3/uL — ABNORMAL LOW (ref 3.8–10.6)

## 2018-04-23 LAB — VITAMIN B12: Vitamin B-12: 469 pg/mL (ref 180–914)

## 2018-04-23 NOTE — Progress Notes (Signed)
[facility] day:  04/23/2018    Chief Complaint: Alex Smith is a 83 y.o. male with a myelodysplastic marrow, smoldering myeloma, and a history of hairy cell leukemia who is seen for 3 month assessment.  HPI:  The patient was last seen in the medical oncology clinic on 01/19/2018.  At that time, he felt "ok".  He described diarrhea for the last 2 months with no recent dietary changes or antibiotic therapy. He noted some fatigue.  He denied chest pain and shortness of breath. Exam was stable.  Hematocrit was 26.0, hemoglobin of 9.2, platelets 92,000, and WBC 2700 with ANC 1900.  Creatinine was 1.61.  Stool studies were requested but not performed.  Additional labs included:  M- spike 1.3 gm/dL, Kappa free light chains 48.9 (ratio 4.29), folate 38, and B12 308.  During the interim, he states that he is eating better.  Diarrhea went away "more or less".  He notes a little shortness of breath.  He denies any fever.  He is not walking a lot as it is "too hot".  He has been on and off his B12.     Past Medical History:  Diagnosis Date  . Anemia   . B12 deficiency   . Basal cell carcinoma of face   . BPH (benign prostatic hypertrophy)    followed by urology, discharged (Dr. Bernardo Smith)  . CAP (community acquired pneumonia) 02/15/2015  . CKD stage 3 due to type 2 diabetes mellitus ([place]) 11/14/2017  . Colon polyps   . Diverticulosis   . GERD (gastroesophageal reflux disease)   . Hairy cell leukemia ([place]) 2006   recurrent, seizure on rituxan, now on cladribine ([place])  . History of pneumonia 2000's   "once" (07/07/2012)  . History of shingles   . HLD (hyperlipidemia)   . Hypertension   . Pneumonia   . Shortness of breath dyspnea   . Systolic murmur 16/05/9603  . Type 2 diabetes, controlled, with retinopathy ([place])     Past Surgical History:  Procedure Laterality Date  . [place] VITRECTOMY WITH 20 GAUGE MVR PORT FOR MACULAR HOLE   07/07/2012   Procedure: 25 GAUGE PARS PLANA VITRECTOMY WITH 20 GAUGE MVR PORT FOR MACULAR HOLE;  Surgeon: Alex Pedro, MD;  Location: [facility];  Service: Ophthalmology;  Laterality: Left;  . BONE MARROW BIOPSY  2016  . CARDIOVASCULAR STRESS TEST  2013   treadmill - no evidence ischemia, EF 61%  . CATARACT EXTRACTION W/ INTRAOCULAR LENS  IMPLANT, BILATERAL  ~ 2010  . COLONOSCOPY  2014   [place] WNL no rpt needed, h/o polyps  . EYE SURGERY Left 06/2012   laser surgery  . GAS INSERTION  07/07/2012   Procedure: INSERTION OF GAS;  Surgeon: Alex Pedro, MD;  Location: [facility];  Service: Ophthalmology;  Laterality: Left;  [id number]  . PERIPHERAL VASCULAR CATHETERIZATION N/A 02/23/2015   Procedure: [facility] Cath Insertion;  Surgeon: Alex Huxley, MD;  Location: [facility];  Service: Cardiovascular;  Laterality: N/A;  . SERUM PATCH  07/07/2012   Procedure: SERUM PATCH;  Surgeon: Alex Pedro, MD;  Location: [facility];  Service: Ophthalmology;  Laterality: Left;  . SKIN CANCER EXCISION     "all over my face" (07/07/2012)    Family History  Problem Relation Age of Onset  . Dementia Mother   . Heart failure Father 77  . Cancer Sister  breast  . Diabetes Paternal Uncle   . Diabetes Paternal Aunt   . CAD Brother 78       MI  . Stroke Neg Hx     Social History:  reports that he quit smoking about 49 years ago. His smoking use included cigarettes. He has a 50.00 pack-year smoking history. He has never used smokeless tobacco. He reports that he drinks alcohol. He reports that he does not use drugs.  He works at [employer] 20-32 hours/week.  The patient 's wife has had 3 hip operations.  She underwent hip replacement 03/06/2016.  His wife is in [place].  He states that they either eat out or bring food in.  His best contact number is his cell: [phone]. Patient has a daughter Alex Smith). He is alone today.  Allergies:  Allergies  Allergen Reactions  . Rituximab Rash    Chest  tightness Chest tightness  . Blood-Group Specific Substance Other (See Comments)    Had a post transfusion reaction of red blood cells; NOW REQUIRES WASHED BLOOD CELLS Had a post transfusion reaction of red blood cells; NOW REQUIRES WASHED BLOOD CELLS  . Primaxin [Imipenem] Other (See Comments)    Possible allergy Possible allergy  . Voriconazole Other (See Comments)  . Sulfa Antibiotics Itching and Rash  . Sulfacetamide Sodium Itching and Rash  Possible allergy to Zoloft, allopurinol, and a chemotherapy medication.  He tolerates Dapsone.  Current Medications: Current Outpatient Medications  Medication Sig Dispense Refill  . ACCU-CHEK AVIVA PLUS test strip USE TO CHECK SUGAR TWICE DAILY 200 each 0  . acyclovir (ZOVIRAX) 400 MG tablet TAKE TWO TABLETS BY MOUTH TWICE DAILY 120 tablet 3  . aspirin EC 81 MG tablet Take 81 mg by mouth daily.    Marland Kitchen doxazosin (CARDURA) 1 MG tablet Take 1 tablet (1 mg total) by mouth daily. 30 tablet 6  . HUMULIN N 100 UNIT/ML injection INJECT 39 UNITS SUB-Q TWICE DAILY BEFOREA MEAL 20 mL 6  . hydrochlorothiazide (HYDRODIURIL) 25 MG tablet TAKE ONE TABLET BY MOUTH EVERY DAY 30 tablet 5  . insulin regular (HUMULIN R) 100 units/mL injection Inject 0.1 mLs (10 Units total) into the skin 2 (two) times daily before a meal. 10 mL 5  . lidocaine-prilocaine (EMLA) cream Apply 1 application topically as needed. 30 g 3  . lisinopril (PRINIVIL,ZESTRIL) 20 MG tablet TAKE ONE TABLET BY MOUTH TWICE DAILY 60 tablet 5  . metFORMIN (GLUCOPHAGE) 1000 MG tablet TAKE ONE TABLET TWICE DAILY 180 tablet 3  . MICROLET LANCETS MISC USE AS DIRECTED 200 each 3  . pravastatin (PRAVACHOL) 20 MG tablet TAKE 1 TABLET BY MOUTH NIGHTLY 30 tablet 5  . ranitidine (ZANTAC) 150 MG tablet TAKE ONE TABLET BY MOUTH TWICE DAILY AS NEEDED 180 tablet 0  . tamsulosin (FLOMAX) 0.4 MG CAPS capsule TAKE 1 CAPSULE BY MOUTH EVERY DAY 30 capsule 3  . traZODone (DESYREL) 50 MG tablet TAKE 1/2 TO 1 TABLET BY  MOUTH AT BEDTIMEAS NEEDED FOR SLEEP 30 tablet 0  . vitamin B-12 (CYANOCOBALAMIN) 1000 MCG tablet Take 1 tablet (1,000 mcg total) by mouth daily. 90 tablet 1  . vitamin E 400 UNIT capsule Take 200 Units by mouth daily.      Current Facility-Administered Medications  Medication Dose Route Frequency Provider Last Rate Last Dose  . Tbo-Filgrastim (GRANIX) injection 480 mcg  480 mcg Subcutaneous Once Lequita Asal, MD       Facility-Administered Medications Ordered in Other Visits  Medication Dose  Route Frequency Provider Last Rate Last Dose  . heparin lock flush 100 unit/mL  500 Units Intravenous Once [place], Melissa C, MD      . sodium chloride 0.9 % injection 10 mL  10 mL Intravenous PRN Lequita Asal, MD   10 mL at 03/03/15 0903  . sodium chloride 0.9 % injection 10 mL  10 mL Intracatheter PRN Nolon Stalls C, MD   10 mL at 03/10/15 1410    Review of Systems  Constitutional: Positive for weight loss (up 1 pound). Negative for diaphoresis, fever and malaise/fatigue.       Feels "ok".  HENT: Negative.  Negative for congestion, ear discharge, ear pain, nosebleeds, sinus pain, sore throat and tinnitus.   Eyes: Negative.  Negative for double vision, pain, discharge and redness.  Respiratory: Positive for shortness of breath (little). Negative for cough, hemoptysis and sputum production.   Cardiovascular: Negative.  Negative for chest pain, palpitations, orthopnea, claudication, leg swelling and PND.  Gastrointestinal: Positive for diarrhea (gone, more or less). Negative for abdominal pain, blood in stool, constipation, melena, nausea and vomiting.  Genitourinary: Negative.  Negative for dysuria, frequency, hematuria and urgency.  Musculoskeletal: Positive for back pain (lower). Negative for falls, joint pain, myalgias and neck pain.  Skin: Negative.  Negative for itching and rash.  Neurological: Negative for dizziness, tingling, tremors, sensory change, speech change, weakness  and headaches.  Endo/Heme/Allergies: Does not bruise/bleed easily.       Diabetes.  Blood sugar "great".  Psychiatric/Behavioral: Negative for depression and memory loss. The patient is not nervous/anxious and does not have insomnia.   All other systems reviewed and are negative.  Performance status (ECOG): 1  Physical Exam: Blood pressure (!) 151/89, pulse 90, temperature 98.2 F (36.8 C), temperature source Tympanic, resp. rate 18, weight 222 lb (100.7 kg), SpO2 95 %.  GENERAL:  Well developed, well nourished, gentleman sitting comfortably in the exam room in no acute distress. MENTAL STATUS:  Alert and oriented to person, place and time. HEAD:  White hair.  Normocephalic, atraumatic, face symmetric, no Cushingoid features. EYES:  Glasses.  Blue eyes.  Pupils equal round and reactive to light and accomodation.  No conjunctivitis or scleral icterus. ENT:  Oropharynx clear without lesion.  Tongue normal. Mucous membranes moist.  RESPIRATORY:  Clear to auscultation without rales, wheezes or rhonchi. CARDIOVASCULAR:  Regular rate and rhythm with systolic murmur.  No rub or gallop. ABDOMEN:  Soft, non-tender, with active bowel sounds, and no hepatosplenomegaly.  No masses. BACK:  Pain on palpation lateral to spine c/w muscular pain. SKIN:  No rashes, ulcers or lesions. EXTREMITIES: No edema, no skin discoloration or tenderness.  No palpable cords. LYMPH NODES: No palpable cervical, supraclavicular, axillary or inguinal adenopathy  NEUROLOGICAL: Unremarkable. PSYCH:  Appropriate.    Appointment on 04/23/2018  Component Date Value Ref Range Status  . WBC 04/23/2018 3.2* 3.8 - 10.6 K/uL Final  . RBC 04/23/2018 2.43* 4.40 - 5.90 MIL/uL Final  . Hemoglobin 04/23/2018 9.4* 13.0 - 18.0 g/dL Final  . HCT 04/23/2018 26.8* 40.0 - 52.0 % Final  . MCV 04/23/2018 110.4* 80.0 - 100.0 fL Final  . MCH 04/23/2018 38.8* 26.0 - 34.0 pg Final  . MCHC 04/23/2018 35.1  32.0 - 36.0 g/dL Final  . RDW  04/23/2018 14.9* 11.5 - 14.5 % Final  . Platelets 04/23/2018 96* 150 - 440 K/uL Final  . Neutrophils Relative % 04/23/2018 67  % Final  . Neutro Abs 04/23/2018 2.1  1.4 -  6.5 K/uL Final  . Lymphocytes Relative 04/23/2018 24  % Final  . Lymphs Abs 04/23/2018 0.8* 1.0 - 3.6 K/uL Final  . Monocytes Relative 04/23/2018 6  % Final  . Monocytes Absolute 04/23/2018 0.2  0.2 - 1.0 K/uL Final  . Eosinophils Relative 04/23/2018 2  % Final  . Eosinophils Absolute 04/23/2018 0.1  0 - 0.7 K/uL Final  . Basophils Relative 04/23/2018 1  % Final  . Basophils Absolute 04/23/2018 0.0  0 - 0.1 K/uL Final   Performed at [facility], [address], [place], [place] [address]  . Sodium 04/23/2018 137  135 - 145 mmol/L Final  . Potassium 04/23/2018 4.1  3.5 - 5.1 mmol/L Final  . Chloride 04/23/2018 105  98 - 111 mmol/L Final  . CO2 04/23/2018 24  22 - 32 mmol/L Final  . Glucose, Bld 04/23/2018 237* 70 - 99 mg/dL Final  . BUN 04/23/2018 31* 8 - 23 mg/dL Final  . Creatinine, Ser 04/23/2018 1.62* 0.61 - 1.24 mg/dL Final  . Calcium 04/23/2018 9.4  8.9 - 10.3 mg/dL Final  . Total Protein 04/23/2018 7.6  6.5 - 8.1 g/dL Final  . Albumin 04/23/2018 4.2  3.5 - 5.0 g/dL Final  . AST 04/23/2018 39  15 - 41 U/L Final  . ALT 04/23/2018 24  0 - 44 U/L Final  . Alkaline Phosphatase 04/23/2018 55  38 - 126 U/L Final  . Total Bilirubin 04/23/2018 0.6  0.3 - 1.2 mg/dL Final  . GFR calc non Af Amer 04/23/2018 38* >60 mL/min Final  . GFR calc Af Amer 04/23/2018 44* >60 mL/min Final   Comment: (NOTE) The eGFR has been calculated using the CKD EPI equation. This calculation has not been validated in all clinical situations. eGFR's persistently <60 mL/min signify possible Chronic Kidney Disease.   Georgiann Hahn gap 04/23/2018 8  5 - 15 Final   Performed at [facility], [address] [place], [place] [address]    Assessment:  GARNIE BORCHARDT is a 83 y.o. 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  He was diagnosed with  hairy cell leukemia in 2006.  He received cladribine 11/06-11/05/2005.  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   Bone marrow aspirate and biopsy on 02/07/2015 revealed a extensive marrow involvement by recurrent hairy cell leukemia (approximately 80% of cells in the core). There was a monoclonal plasma cell infiltrate (approximately 10%), compatible with a plasma cell neoplasm. Marrow was hypercellular (40-50%) with markedly diminished residual trilineage hematopoiesis and mild multilineage dyspoiesis.  Peripheral smear revealed leukopenia (WBC 1900) with 2% atypical lymphoid cells compatible with hairy cell leukemia. There was moderate anemia with anisopoikilocytosis. 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  FISH studies for MDS were negative.  Cytogenetics were normal (46, XY).  He has an unclear allergy history.  He may be allergic to Primaxin, Zoloft, allopurinol, and a chemotherapy medication.  Reaction occurred in 06/2005.  He is allergic to sulfa.  He is not allergic to dapsone.  He requires washed RBCs.  He is on prophylactic acyclovir.  Dapsone and voriconazole were discontinued.  He has a history of shingles prior to prophylactic acyclovir.  He is 34 months status post cladribine (began 03/03/2015).  His counts plateaued on 07/25/2015 and have subsequently decreased.  Labs on 07/25/2015 revealed a normal B12 (894), folate (60.5), and TSH (2.562).  Ferritin was 50 on 04/26/2016.  Labs  on 08/22/2016 revealed a normal B12, folate, and TSH.  He is on oral B12.  B12 was 693 on 05/27/2017 and 469 on 04/23/2018.  Bone marrow on 11/03/2015 revealed persistent plasma cell neoplasm, now with mildy atypical monoclonal plasma cells estimated at 20-30%.   There was no morphologic evidence of residual hairy cell leukemia.  There was a minute population (0.03%) with hairy cell  immunophenotype detected by flow.  Marrow was normocellular for age with relative erythroid hyperplasia, relative myeloid hypoplasia, mild dyspoiesis, adequate megakaryocytes and no increased blasts.  There was no significant increase in marrow reticulin fibers.  Iron was present.  FISH studies for MDS were negative.  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  Cytogenetics were normal (46, XY).  Bone marrow on 03/13/2016 was normocellular to marginally hypercellular for age (20-40%) with relative erythroid hyperplasia and mild dyserythropoiesis, decreased granulopoiesis and adequate megakaryocytes.  There was a persistent 15-20% plasma cell neoplasm.  There was no overt increase in reticulin fibrosis.  Storage iron was present.  Flow cytometry revealed abnormal/monocytic plasma cell population (3% sample), consistent with a plasma cell neoplastic process.  Cytogenetics were normal (46, XY).  FISH studies for myeloma revealed a CCND1/IGH translocation, t(11;14) in 53% of nuclei.  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  There was no evidence of leukemia, lymphoma, or high grade dysplasia.  Cytogenetics were normal (46, XY).  SPEP has been followed: 1.0 gm/dL on 02/03/2015, 1.4 on 07/25/2015, 1.3 on 11/17/2015, 1.2 on 06/28/2016, 1.0 on 09/20/2016, 0.8 on 04/29/2017, 0.7 [facility]) on 05/12/2017, 1.2 on 09/29/2017, 1.3 on 01/19/2018, and 1.2 on 04/23/2018.    Kappa free light chains have been followed: 47.63 (ratio 3.12) on 02/03/2015, 54.9 (ratio 3.14) on 02/07/2015, 46.23 (ratio 3.68) on 07/25/2015, 50.5 (ratio 4.82) on 11/17/2015, 44.3 (ratio 4.22) on 09/01/2017, and 48.9 (ratio 4.29) on 01/19/2018.  Beta2-microglobulin was 2.5 on 11/17/2015.  24 hour urine on 11/21/2015 revealed 234.8 mg protein in 24 hours (no monoclonal protein).  Bone survey on 11/24/2015 revealed no lytic lesions.   Epo level was 329.6 on 03/29/2016.  He began a trial of Procrit.  He received 10,000 units of Procrit weekly (04/26/2016 - 06/14/2016).  Hemoglobin ranged between 8.4 and 8.9 without trend.  He receives PRBCs (washed and irradiated) to maintain a hemoglobin > 9.0.  He has received 10 units to date (last on 12/12/2017).  He is s/p 1 cycle ofRevlimid (10 mg) and Decadron(06/29/2016 - 07/19/2016). Cycle #1 was complicated by anemia and neutropenia. He has received 4 units of PRBCs(1 unit:07/22/2016 and 2 units: 08/07/2016, 1 unit on 01/14/2017, and 1 unit on 02/27/2017; 1 unit on 04/30/2017). He received 6 days ofGCSF.  He is s/p 5 cycles of Revlimid (5 mg) and Decadron (09/14/2016 - 04/01/2017).  Revlimid was on temporary hold beginning 03/04/2017.  Revlimid was discontinued on 05/27/2017.  He began weekly GCSF on 11/05/2016 (last 06/10/2017).  He receives GCSF if his ANC is <= 1500.  He is on aspirin 81 mg a day prophylaxis.  PET scan on 03/14/2016 revealed no evidence of hypermetabolic lymphadenopathy or focal skeletal lesions to suggest marrow or cortical bone involvement.  Echo on 06/06/2017 revealed an EF of 65-70%.  Mitral valve was calcified.  The left atrium was mildly dilated.  He has basal cell carcinoma of the left nasal ala s/p biopsy.  He is scheduled to begin radiation (electron beam 5000 cGyover 5 weeks).  Symptomatically, he feels about the same.  He notes a little shortness of breath.  Hemoglobin is 9.4.  Platelet count is 96,000.  [place] is 2100.  M-spike is 1.2 (stable).  Plan: 1.  Labs today:  CBC with diff, CMP, SPEP, B12. 2.  Smoldering multiple myeloma:  M-spike is 1.2.  Continue surveillance. 3.  Hairy cell leukemia:  In remission.  Continue surveillance. 4.  Probable myelodysplastic syndrome:  Anemia requiring infrequent PRBC transfusions.  Thrombocytopenia, stable.  Neutropenia, resolved off Revlimid. 5.  B12 deficiency:  Check B12 level today.  Continue  oral B12. 6.  Anemia:  Discuss plan for transfusion of washed RBCs if hemoglobin < 9 secondary to symptoms.  Patient to contact clinic between appointments if any symptoms for CBC and possible transfusion. 7.  RTC in 3 months for MD assessment and labs (CBC with diff, CMP, SPEP, FLCA).   Lequita Asal, MD  04/23/2018, 2:41 PM

## 2018-04-27 LAB — PROTEIN ELECTROPHORESIS, SERUM
A/G Ratio: 1.2 (ref 0.7–1.7)
Albumin ELP: 3.9 g/dL (ref 2.9–4.4)
Alpha-1-Globulin: 0.2 g/dL (ref 0.0–0.4)
Alpha-2-Globulin: 0.7 g/dL (ref 0.4–1.0)
Beta Globulin: 0.9 g/dL (ref 0.7–1.3)
Gamma Globulin: 1.4 g/dL (ref 0.4–1.8)
Globulin, Total: 3.2 g/dL (ref 2.2–3.9)
M-Spike, %: 1.2 g/dL — ABNORMAL HIGH
Total Protein ELP: 7.1 g/dL (ref 6.0–8.5)

## 2018-05-04 ENCOUNTER — Other Ambulatory Visit: Payer: Self-pay | Admitting: Family Medicine

## 2018-05-04 ENCOUNTER — Ambulatory Visit: Payer: Medicare Other

## 2018-05-04 DIAGNOSIS — C9000 Multiple myeloma not having achieved remission: Secondary | ICD-10-CM | POA: Diagnosis not present

## 2018-05-04 DIAGNOSIS — Z510 Encounter for antineoplastic radiation therapy: Secondary | ICD-10-CM | POA: Diagnosis not present

## 2018-05-05 ENCOUNTER — Ambulatory Visit
Admission: RE | Admit: 2018-05-05 | Discharge: 2018-05-05 | Disposition: A | Discharge status: Home / Self Care | Payer: Medicare Other | Source: Ambulatory Visit | Attending: Radiation Oncology | Admitting: Radiation Oncology

## 2018-05-05 DIAGNOSIS — Z510 Encounter for antineoplastic radiation therapy: Secondary | ICD-10-CM | POA: Diagnosis not present

## 2018-05-05 DIAGNOSIS — C9000 Multiple myeloma not having achieved remission: Secondary | ICD-10-CM | POA: Diagnosis not present

## 2018-05-05 NOTE — Telephone Encounter (Signed)
Acyclovir Last filled:  04/09/18, #120 Last OV:  02/24/18, CPE Next OV:  08/28/18, f/u

## 2018-05-06 ENCOUNTER — Ambulatory Visit
Admission: RE | Admit: 2018-05-06 | Discharge: 2018-05-06 | Disposition: A | Discharge status: Home / Self Care | Payer: Medicare Other | Source: Ambulatory Visit | Attending: Radiation Oncology | Admitting: Radiation Oncology

## 2018-05-06 DIAGNOSIS — Z510 Encounter for antineoplastic radiation therapy: Secondary | ICD-10-CM | POA: Diagnosis not present

## 2018-05-06 DIAGNOSIS — C9000 Multiple myeloma not having achieved remission: Secondary | ICD-10-CM | POA: Diagnosis not present

## 2018-05-07 ENCOUNTER — Ambulatory Visit
Admission: RE | Admit: 2018-05-07 | Discharge: 2018-05-07 | Disposition: A | Discharge status: Home / Self Care | Payer: Medicare Other | Source: Ambulatory Visit | Attending: Radiation Oncology | Admitting: Radiation Oncology

## 2018-05-07 DIAGNOSIS — C9000 Multiple myeloma not having achieved remission: Secondary | ICD-10-CM | POA: Diagnosis not present

## 2018-05-07 DIAGNOSIS — Z510 Encounter for antineoplastic radiation therapy: Secondary | ICD-10-CM | POA: Diagnosis not present

## 2018-05-08 ENCOUNTER — Ambulatory Visit
Admission: RE | Admit: 2018-05-08 | Discharge: 2018-05-08 | Disposition: A | Discharge status: Home / Self Care | Payer: Medicare Other | Source: Ambulatory Visit | Attending: Radiation Oncology | Admitting: Radiation Oncology

## 2018-05-08 DIAGNOSIS — C9000 Multiple myeloma not having achieved remission: Secondary | ICD-10-CM | POA: Diagnosis not present

## 2018-05-08 DIAGNOSIS — Z87891 Personal history of nicotine dependence: Secondary | ICD-10-CM | POA: Diagnosis not present

## 2018-05-08 DIAGNOSIS — C44311 Basal cell carcinoma of skin of nose: Secondary | ICD-10-CM | POA: Diagnosis not present

## 2018-05-08 DIAGNOSIS — Z510 Encounter for antineoplastic radiation therapy: Secondary | ICD-10-CM | POA: Diagnosis not present

## 2018-05-11 ENCOUNTER — Ambulatory Visit
Admission: RE | Admit: 2018-05-11 | Discharge: 2018-05-11 | Disposition: A | Discharge status: Home / Self Care | Payer: Medicare Other | Source: Ambulatory Visit | Attending: Radiation Oncology | Admitting: Radiation Oncology

## 2018-05-11 DIAGNOSIS — Z510 Encounter for antineoplastic radiation therapy: Secondary | ICD-10-CM | POA: Diagnosis not present

## 2018-05-11 DIAGNOSIS — C9000 Multiple myeloma not having achieved remission: Secondary | ICD-10-CM | POA: Diagnosis not present

## 2018-05-12 ENCOUNTER — Ambulatory Visit
Admission: RE | Admit: 2018-05-12 | Discharge: 2018-05-12 | Disposition: A | Discharge status: Home / Self Care | Payer: Medicare Other | Source: Ambulatory Visit | Attending: Radiation Oncology | Admitting: Radiation Oncology

## 2018-05-12 DIAGNOSIS — C9000 Multiple myeloma not having achieved remission: Secondary | ICD-10-CM | POA: Diagnosis not present

## 2018-05-12 DIAGNOSIS — Z510 Encounter for antineoplastic radiation therapy: Secondary | ICD-10-CM | POA: Diagnosis not present

## 2018-05-13 ENCOUNTER — Other Ambulatory Visit: Payer: Self-pay | Admitting: Family Medicine

## 2018-05-13 ENCOUNTER — Ambulatory Visit
Admission: RE | Admit: 2018-05-13 | Discharge: 2018-05-13 | Disposition: A | Discharge status: Home / Self Care | Payer: Medicare Other | Source: Ambulatory Visit | Attending: Radiation Oncology | Admitting: Radiation Oncology

## 2018-05-13 DIAGNOSIS — Z510 Encounter for antineoplastic radiation therapy: Secondary | ICD-10-CM | POA: Diagnosis not present

## 2018-05-13 DIAGNOSIS — C9000 Multiple myeloma not having achieved remission: Secondary | ICD-10-CM | POA: Diagnosis not present

## 2018-05-14 ENCOUNTER — Ambulatory Visit
Admission: RE | Admit: 2018-05-14 | Discharge: 2018-05-14 | Disposition: A | Discharge status: Home / Self Care | Payer: Medicare Other | Source: Ambulatory Visit | Attending: Radiation Oncology | Admitting: Radiation Oncology

## 2018-05-14 DIAGNOSIS — Z510 Encounter for antineoplastic radiation therapy: Secondary | ICD-10-CM | POA: Diagnosis not present

## 2018-05-14 DIAGNOSIS — C9000 Multiple myeloma not having achieved remission: Secondary | ICD-10-CM | POA: Diagnosis not present

## 2018-05-15 ENCOUNTER — Ambulatory Visit
Admission: RE | Admit: 2018-05-15 | Discharge: 2018-05-15 | Disposition: A | Discharge status: Home / Self Care | Payer: Medicare Other | Source: Ambulatory Visit | Attending: Radiation Oncology | Admitting: Radiation Oncology

## 2018-05-15 DIAGNOSIS — C9000 Multiple myeloma not having achieved remission: Secondary | ICD-10-CM | POA: Diagnosis not present

## 2018-05-15 DIAGNOSIS — Z87891 Personal history of nicotine dependence: Secondary | ICD-10-CM | POA: Diagnosis not present

## 2018-05-15 DIAGNOSIS — Z510 Encounter for antineoplastic radiation therapy: Secondary | ICD-10-CM | POA: Diagnosis not present

## 2018-05-15 DIAGNOSIS — C44311 Basal cell carcinoma of skin of nose: Secondary | ICD-10-CM | POA: Diagnosis not present

## 2018-05-18 ENCOUNTER — Ambulatory Visit
Admission: RE | Admit: 2018-05-18 | Discharge: 2018-05-18 | Disposition: A | Discharge status: Home / Self Care | Payer: Medicare Other | Source: Ambulatory Visit | Attending: Radiation Oncology | Admitting: Radiation Oncology

## 2018-05-18 DIAGNOSIS — Z510 Encounter for antineoplastic radiation therapy: Secondary | ICD-10-CM | POA: Diagnosis not present

## 2018-05-18 DIAGNOSIS — C9000 Multiple myeloma not having achieved remission: Secondary | ICD-10-CM | POA: Diagnosis not present

## 2018-05-19 ENCOUNTER — Ambulatory Visit
Admission: RE | Admit: 2018-05-19 | Discharge: 2018-05-19 | Disposition: A | Discharge status: Home / Self Care | Payer: Medicare Other | Source: Ambulatory Visit | Attending: Radiation Oncology | Admitting: Radiation Oncology

## 2018-05-19 DIAGNOSIS — Z510 Encounter for antineoplastic radiation therapy: Secondary | ICD-10-CM | POA: Insufficient documentation

## 2018-05-19 DIAGNOSIS — C9000 Multiple myeloma not having achieved remission: Secondary | ICD-10-CM | POA: Insufficient documentation

## 2018-05-20 ENCOUNTER — Ambulatory Visit
Admission: RE | Admit: 2018-05-20 | Discharge: 2018-05-20 | Disposition: A | Discharge status: Home / Self Care | Payer: Medicare Other | Source: Ambulatory Visit | Attending: Radiation Oncology | Admitting: Radiation Oncology

## 2018-05-20 DIAGNOSIS — C9000 Multiple myeloma not having achieved remission: Secondary | ICD-10-CM | POA: Diagnosis not present

## 2018-05-20 DIAGNOSIS — Z510 Encounter for antineoplastic radiation therapy: Secondary | ICD-10-CM | POA: Diagnosis not present

## 2018-05-21 ENCOUNTER — Ambulatory Visit
Admission: RE | Admit: 2018-05-21 | Discharge: 2018-05-21 | Disposition: A | Discharge status: Home / Self Care | Payer: Medicare Other | Source: Ambulatory Visit | Attending: Radiation Oncology | Admitting: Radiation Oncology

## 2018-05-21 DIAGNOSIS — C9000 Multiple myeloma not having achieved remission: Secondary | ICD-10-CM | POA: Diagnosis not present

## 2018-05-21 DIAGNOSIS — Z510 Encounter for antineoplastic radiation therapy: Secondary | ICD-10-CM | POA: Diagnosis not present

## 2018-05-22 ENCOUNTER — Ambulatory Visit
Admission: RE | Admit: 2018-05-22 | Discharge: 2018-05-22 | Disposition: A | Discharge status: Home / Self Care | Payer: Medicare Other | Source: Ambulatory Visit | Attending: Radiation Oncology | Admitting: Radiation Oncology

## 2018-05-22 DIAGNOSIS — C44311 Basal cell carcinoma of skin of nose: Secondary | ICD-10-CM | POA: Diagnosis not present

## 2018-05-22 DIAGNOSIS — Z510 Encounter for antineoplastic radiation therapy: Secondary | ICD-10-CM | POA: Diagnosis not present

## 2018-05-22 DIAGNOSIS — C9000 Multiple myeloma not having achieved remission: Secondary | ICD-10-CM | POA: Diagnosis not present

## 2018-05-22 DIAGNOSIS — Z87891 Personal history of nicotine dependence: Secondary | ICD-10-CM | POA: Diagnosis not present

## 2018-05-25 ENCOUNTER — Ambulatory Visit
Admission: RE | Admit: 2018-05-25 | Discharge: 2018-05-25 | Disposition: A | Discharge status: Home / Self Care | Payer: Medicare Other | Source: Ambulatory Visit | Attending: Radiation Oncology | Admitting: Radiation Oncology

## 2018-05-25 ENCOUNTER — Other Ambulatory Visit: Payer: Self-pay | Admitting: Family Medicine

## 2018-05-25 DIAGNOSIS — C9000 Multiple myeloma not having achieved remission: Secondary | ICD-10-CM | POA: Diagnosis not present

## 2018-05-25 DIAGNOSIS — Z510 Encounter for antineoplastic radiation therapy: Secondary | ICD-10-CM | POA: Diagnosis not present

## 2018-05-26 ENCOUNTER — Ambulatory Visit
Admission: RE | Admit: 2018-05-26 | Discharge: 2018-05-26 | Disposition: A | Discharge status: Home / Self Care | Payer: Medicare Other | Source: Ambulatory Visit | Attending: Radiation Oncology | Admitting: Radiation Oncology

## 2018-05-26 DIAGNOSIS — Z510 Encounter for antineoplastic radiation therapy: Secondary | ICD-10-CM | POA: Diagnosis not present

## 2018-05-26 DIAGNOSIS — C9000 Multiple myeloma not having achieved remission: Secondary | ICD-10-CM | POA: Diagnosis not present

## 2018-05-27 ENCOUNTER — Ambulatory Visit
Admission: RE | Admit: 2018-05-27 | Discharge: 2018-05-27 | Disposition: A | Discharge status: Home / Self Care | Payer: Medicare Other | Source: Ambulatory Visit | Attending: Radiation Oncology | Admitting: Radiation Oncology

## 2018-05-27 DIAGNOSIS — Z510 Encounter for antineoplastic radiation therapy: Secondary | ICD-10-CM | POA: Diagnosis not present

## 2018-05-27 DIAGNOSIS — C9000 Multiple myeloma not having achieved remission: Secondary | ICD-10-CM | POA: Diagnosis not present

## 2018-05-28 ENCOUNTER — Ambulatory Visit
Admission: RE | Admit: 2018-05-28 | Discharge: 2018-05-28 | Disposition: A | Discharge status: Home / Self Care | Payer: Medicare Other | Source: Ambulatory Visit | Attending: Radiation Oncology | Admitting: Radiation Oncology

## 2018-05-28 DIAGNOSIS — C9000 Multiple myeloma not having achieved remission: Secondary | ICD-10-CM | POA: Diagnosis not present

## 2018-05-28 DIAGNOSIS — Z510 Encounter for antineoplastic radiation therapy: Secondary | ICD-10-CM | POA: Diagnosis not present

## 2018-05-29 ENCOUNTER — Ambulatory Visit
Admission: RE | Admit: 2018-05-29 | Discharge: 2018-05-29 | Disposition: A | Discharge status: Home / Self Care | Payer: Medicare Other | Source: Ambulatory Visit | Attending: Radiation Oncology | Admitting: Radiation Oncology

## 2018-05-29 DIAGNOSIS — C9000 Multiple myeloma not having achieved remission: Secondary | ICD-10-CM | POA: Diagnosis not present

## 2018-05-29 DIAGNOSIS — C44311 Basal cell carcinoma of skin of nose: Secondary | ICD-10-CM | POA: Diagnosis not present

## 2018-05-29 DIAGNOSIS — Z510 Encounter for antineoplastic radiation therapy: Secondary | ICD-10-CM | POA: Diagnosis not present

## 2018-05-29 DIAGNOSIS — Z87891 Personal history of nicotine dependence: Secondary | ICD-10-CM | POA: Diagnosis not present

## 2018-06-01 ENCOUNTER — Ambulatory Visit
Admission: RE | Admit: 2018-06-01 | Discharge: 2018-06-01 | Disposition: A | Discharge status: Home / Self Care | Payer: Medicare Other | Source: Ambulatory Visit | Attending: Radiation Oncology | Admitting: Radiation Oncology

## 2018-06-01 DIAGNOSIS — C9000 Multiple myeloma not having achieved remission: Secondary | ICD-10-CM | POA: Diagnosis not present

## 2018-06-01 DIAGNOSIS — Z510 Encounter for antineoplastic radiation therapy: Secondary | ICD-10-CM | POA: Diagnosis not present

## 2018-06-02 ENCOUNTER — Ambulatory Visit
Admission: RE | Admit: 2018-06-02 | Discharge: 2018-06-02 | Disposition: A | Discharge status: Home / Self Care | Payer: Medicare Other | Source: Ambulatory Visit | Attending: Radiation Oncology | Admitting: Radiation Oncology

## 2018-06-02 DIAGNOSIS — Z510 Encounter for antineoplastic radiation therapy: Secondary | ICD-10-CM | POA: Diagnosis not present

## 2018-06-02 DIAGNOSIS — C9000 Multiple myeloma not having achieved remission: Secondary | ICD-10-CM | POA: Diagnosis not present

## 2018-06-03 ENCOUNTER — Ambulatory Visit
Admission: RE | Admit: 2018-06-03 | Discharge: 2018-06-03 | Disposition: A | Discharge status: Home / Self Care | Payer: Medicare Other | Source: Ambulatory Visit | Attending: Radiation Oncology | Admitting: Radiation Oncology

## 2018-06-03 DIAGNOSIS — C9000 Multiple myeloma not having achieved remission: Secondary | ICD-10-CM | POA: Diagnosis not present

## 2018-06-03 DIAGNOSIS — Z510 Encounter for antineoplastic radiation therapy: Secondary | ICD-10-CM | POA: Diagnosis not present

## 2018-06-04 ENCOUNTER — Ambulatory Visit
Admission: RE | Admit: 2018-06-04 | Discharge: 2018-06-04 | Disposition: A | Discharge status: Home / Self Care | Payer: Medicare Other | Source: Ambulatory Visit | Attending: Radiation Oncology | Admitting: Radiation Oncology

## 2018-06-04 ENCOUNTER — Other Ambulatory Visit: Payer: Self-pay | Admitting: Family Medicine

## 2018-06-04 DIAGNOSIS — Z510 Encounter for antineoplastic radiation therapy: Secondary | ICD-10-CM | POA: Diagnosis not present

## 2018-06-04 DIAGNOSIS — C9000 Multiple myeloma not having achieved remission: Secondary | ICD-10-CM | POA: Diagnosis not present

## 2018-06-05 ENCOUNTER — Ambulatory Visit
Admission: RE | Admit: 2018-06-05 | Discharge: 2018-06-05 | Disposition: A | Discharge status: Home / Self Care | Payer: Medicare Other | Source: Ambulatory Visit | Attending: Radiation Oncology | Admitting: Radiation Oncology

## 2018-06-05 DIAGNOSIS — Z510 Encounter for antineoplastic radiation therapy: Secondary | ICD-10-CM | POA: Diagnosis not present

## 2018-06-05 DIAGNOSIS — C9000 Multiple myeloma not having achieved remission: Secondary | ICD-10-CM | POA: Diagnosis not present

## 2018-06-05 DIAGNOSIS — C44311 Basal cell carcinoma of skin of nose: Secondary | ICD-10-CM | POA: Diagnosis not present

## 2018-06-05 DIAGNOSIS — Z87891 Personal history of nicotine dependence: Secondary | ICD-10-CM | POA: Diagnosis not present

## 2018-06-08 ENCOUNTER — Ambulatory Visit: Payer: Medicare Other

## 2018-06-08 ENCOUNTER — Encounter: Admit: 2018-06-08 | Discharge: 2018-06-09 | Payer: MEDICARE | Attending: Adult Health | Primary: Adult Health

## 2018-06-08 ENCOUNTER — Encounter: Admit: 2018-06-08 | Discharge: 2018-06-09 | Payer: MEDICARE

## 2018-06-08 DIAGNOSIS — C9000 Multiple myeloma not having achieved remission: Principal | ICD-10-CM

## 2018-06-08 DIAGNOSIS — D469 Myelodysplastic syndrome, unspecified: Secondary | ICD-10-CM

## 2018-06-08 DIAGNOSIS — D61818 Other pancytopenia: Secondary | ICD-10-CM

## 2018-06-08 DIAGNOSIS — C9141 Hairy cell leukemia, in remission: Secondary | ICD-10-CM

## 2018-06-09 ENCOUNTER — Other Ambulatory Visit: Payer: Self-pay

## 2018-06-09 MED ORDER — VITAMIN B-12 1000 MCG PO TABS: 1000.0000 ug | ORAL_TABLET | Freq: Every day | ORAL | 2 refills | Status: DC

## 2018-06-09 NOTE — Telephone Encounter (Signed)
Escribed

## 2018-07-01 DIAGNOSIS — Z85828 Personal history of other malignant neoplasm of skin: Secondary | ICD-10-CM | POA: Diagnosis not present

## 2018-07-01 DIAGNOSIS — L578 Other skin changes due to chronic exposure to nonionizing radiation: Secondary | ICD-10-CM | POA: Diagnosis not present

## 2018-07-01 DIAGNOSIS — L570 Actinic keratosis: Secondary | ICD-10-CM | POA: Diagnosis not present

## 2018-07-07 ENCOUNTER — Other Ambulatory Visit: Payer: Self-pay | Admitting: Family Medicine

## 2018-07-07 NOTE — Telephone Encounter (Signed)
Acyclovir Last filled:  05/04/18, #120/3 Last OV:  7/9/9, CPE Next OV:  08/28/18, 6 mo f/u

## 2018-07-09 ENCOUNTER — Encounter: Payer: Self-pay | Admitting: Radiation Oncology

## 2018-07-09 ENCOUNTER — Ambulatory Visit
Admission: RE | Admit: 2018-07-09 | Discharge: 2018-07-09 | Disposition: A | Discharge status: Home / Self Care | Payer: Medicare Other | Source: Ambulatory Visit | Attending: Radiation Oncology | Admitting: Radiation Oncology

## 2018-07-09 ENCOUNTER — Other Ambulatory Visit: Payer: Self-pay

## 2018-07-09 VITALS — BP 192/70 | HR 81 | Temp 98.1°F | Resp 18 | Wt 224.8 lb

## 2018-07-09 DIAGNOSIS — C44311 Basal cell carcinoma of skin of nose: Secondary | ICD-10-CM

## 2018-07-09 DIAGNOSIS — Z8522 Personal history of malignant neoplasm of nasal cavities, middle ear, and accessory sinuses: Secondary | ICD-10-CM | POA: Diagnosis not present

## 2018-07-09 DIAGNOSIS — Z87891 Personal history of nicotine dependence: Secondary | ICD-10-CM | POA: Insufficient documentation

## 2018-07-09 DIAGNOSIS — Z923 Personal history of irradiation: Secondary | ICD-10-CM | POA: Diagnosis not present

## 2018-07-09 NOTE — Progress Notes (Signed)
Radiation Oncology Follow up Note  Name: Alex Smith   Date:   07/09/2018 MRN:  [id number] DOB: 1934-09-06    This 83 y.o. male presents to the clinic today for one-month follow-up status post electron beam therapy to his left nasal alla.Marland Kitchen  REFERRING PROVIDER: Ria Bush, MD  HPI: patient is a 83 year old male now out 1 month having completed electron beam therapy to his nose the leftalla. He seen today in routine follow-up is doing well. He specifically denies any nosebleeds pain or discomfort. He's had excellent cosmetic result..  COMPLICATIONS OF TREATMENT: none  FOLLOW UP COMPLIANCE: keeps appointments   PHYSICAL EXAM:  BP (!) 192/70 (BP Location: Left Arm)   Pulse 81   Temp 98.1 F (36.7 C) (Tympanic)   Resp 18   Wt 224 lb 12.1 oz (101.9 kg)   BMI 36.00 kg/m  No evidence of mass or nodularity in the nasal region is noted. No evidence of subdigastric submandibular cervical or supra clavicular adenopathy is identified.Well-developed well-nourished patient in NAD. HEENT reveals PERLA, EOMI, discs not visualized.  Oral cavity is clear. No oral mucosal lesions are identified. Neck is clear without evidence of cervical or supraclavicular adenopathy. Lungs are clear to A&P. Cardiac examination is essentially unremarkable with regular rate and rhythm without murmur rub or thrill. Abdomen is benign with no organomegaly or masses noted. Motor sensory and DTR levels are equal and symmetric in the upper and lower extremities. Cranial nerves II through XII are grossly intact. Proprioception is intact. No peripheral adenopathy or edema is identified. No motor or sensory levels are noted. Crude visual fields are within normal range.  RADIOLOGY RESULTS: no current films for review  PLAN: present time patient is doing well with no evidence of disease. He continues close follow-up care with dermatology. I have asked to see him back in 6 months for follow-up. Patient is to call with any  concerns.    Noreene Filbert, MD

## 2018-07-23 ENCOUNTER — Inpatient Hospital Stay: Payer: Medicare Other | Admitting: Hematology and Oncology

## 2018-07-23 ENCOUNTER — Encounter: Payer: Self-pay | Admitting: Hematology and Oncology

## 2018-07-23 ENCOUNTER — Inpatient Hospital Stay: Payer: Medicare Other | Attending: Hematology and Oncology

## 2018-07-23 VITALS — BP 144/77 | HR 83 | Temp 97.2°F | Resp 18 | Wt 220.5 lb

## 2018-07-23 DIAGNOSIS — D649 Anemia, unspecified: Secondary | ICD-10-CM

## 2018-07-23 DIAGNOSIS — D696 Thrombocytopenia, unspecified: Secondary | ICD-10-CM

## 2018-07-23 DIAGNOSIS — C9000 Multiple myeloma not having achieved remission: Secondary | ICD-10-CM

## 2018-07-23 DIAGNOSIS — E538 Deficiency of other specified B group vitamins: Secondary | ICD-10-CM | POA: Diagnosis not present

## 2018-07-23 DIAGNOSIS — D469 Myelodysplastic syndrome, unspecified: Secondary | ICD-10-CM | POA: Insufficient documentation

## 2018-07-23 DIAGNOSIS — C9141 Hairy cell leukemia, in remission: Secondary | ICD-10-CM

## 2018-07-23 DIAGNOSIS — D472 Monoclonal gammopathy: Secondary | ICD-10-CM

## 2018-07-23 DIAGNOSIS — E119 Type 2 diabetes mellitus without complications: Secondary | ICD-10-CM

## 2018-07-23 DIAGNOSIS — D4620 Refractory anemia with excess of blasts, unspecified: Secondary | ICD-10-CM

## 2018-07-23 DIAGNOSIS — Z452 Encounter for adjustment and management of vascular access device: Secondary | ICD-10-CM

## 2018-07-23 DIAGNOSIS — Z87891 Personal history of nicotine dependence: Secondary | ICD-10-CM

## 2018-07-23 DIAGNOSIS — D509 Iron deficiency anemia, unspecified: Secondary | ICD-10-CM

## 2018-07-23 LAB — CBC WITH DIFFERENTIAL/PLATELET
Abs Immature Granulocytes: 0 10*3/uL (ref 0.00–0.07)
Basophils Absolute: 0 10*3/uL (ref 0.0–0.1)
Basophils Relative: 0 %
Eosinophils Absolute: 0.1 10*3/uL (ref 0.0–0.5)
Eosinophils Relative: 2 %
HCT: 24.9 % — ABNORMAL LOW (ref 39.0–52.0)
Hemoglobin: 8.4 g/dL — ABNORMAL LOW (ref 13.0–17.0)
Immature Granulocytes: 0 %
Lymphocytes Relative: 23 %
Lymphs Abs: 0.7 10*3/uL (ref 0.7–4.0)
MCH: 37 pg — ABNORMAL HIGH (ref 26.0–34.0)
MCHC: 33.7 g/dL (ref 30.0–36.0)
MCV: 109.7 fL — ABNORMAL HIGH (ref 80.0–100.0)
Monocytes Absolute: 0.2 10*3/uL (ref 0.1–1.0)
Monocytes Relative: 7 %
Neutro Abs: 2.2 10*3/uL (ref 1.7–7.7)
Neutrophils Relative %: 68 %
Platelets: 90 10*3/uL — ABNORMAL LOW (ref 150–400)
RBC: 2.27 MIL/uL — ABNORMAL LOW (ref 4.22–5.81)
RDW: 14.2 % (ref 11.5–15.5)
WBC: 3.2 10*3/uL — ABNORMAL LOW (ref 4.0–10.5)
nRBC: 0 % (ref 0.0–0.2)

## 2018-07-23 LAB — COMPREHENSIVE METABOLIC PANEL
ALT: 18 U/L (ref 0–44)
AST: 21 U/L (ref 15–41)
Albumin: 3.9 g/dL (ref 3.5–5.0)
Alkaline Phosphatase: 59 U/L (ref 38–126)
Anion gap: 7 (ref 5–15)
BUN: 23 mg/dL (ref 8–23)
CO2: 26 mmol/L (ref 22–32)
Calcium: 9 mg/dL (ref 8.9–10.3)
Chloride: 104 mmol/L (ref 98–111)
Creatinine, Ser: 1.48 mg/dL — ABNORMAL HIGH (ref 0.61–1.24)
GFR calc Af Amer: 51 mL/min — ABNORMAL LOW (ref >60)
GFR calc non Af Amer: 44 mL/min — ABNORMAL LOW (ref >60)
Glucose, Bld: 259 mg/dL — ABNORMAL HIGH (ref 70–99)
Potassium: 3.9 mmol/L (ref 3.5–5.1)
Sodium: 137 mmol/L (ref 135–145)
Total Bilirubin: 0.7 mg/dL (ref 0.3–1.2)
Total Protein: 7.4 g/dL (ref 6.5–8.1)

## 2018-07-23 MED ORDER — HEPARIN SOD (PORK) LOCK FLUSH 100 UNIT/ML IV SOLN
500.0000 [IU] | Freq: Once | INTRAVENOUS | Status: AC
  Administered 2018-07-23: 500 [IU] via INTRAVENOUS
  Filled 2018-07-23: qty 5

## 2018-07-23 MED ORDER — SODIUM CHLORIDE 0.9% FLUSH
10.0000 mL | INTRAVENOUS | Status: AC | PRN
  Administered 2018-07-23: 10 mL via INTRAVENOUS
  Filled 2018-07-23: qty 10

## 2018-07-23 NOTE — Progress Notes (Signed)
[facility] day:  07/23/2018    Chief Complaint: Alex Smith is a 83 y.o. male with a myelodysplastic marrow, smoldering myeloma, and a history of hairy cell leukemia who is seen for 3 month assessment.  HPI:  The patient was last seen in the medical oncology clinic on 04/23/2018.  At that time,  he felt about the same.  He noted a little shortness of breath.  Hemoglobin was 9.4.  Platelet count was 96,000.  Glenvar was 2100.  M-spike was 1.2 (stable).  During the interim, patient notes that he is "just here". Patient is "basically the same". Patient complains of some bruising following a mechanical fall. Patient has intermittent episodes of shortness of breath. He denies chest pain and palpitations. Patient denies that he has experienced any B symptoms. He denies any interval infections.   Patient is scheduled to follow up with [name] at [facility] in 11/2018.  Patient advises that he maintains an adequate appetite. He is eating well. Weight today is 220 lb 8 oz (100 kg), which compared to his last visit to the clinic, represents a 2 pound decrease.   Patient denies pain in the clinic today.   Past Medical History:  Diagnosis Date  . Anemia   . B12 deficiency   . Basal cell carcinoma of face   . BPH (benign prostatic hypertrophy)    followed by urology, discharged (Dr. Bernardo Heater)  . CAP (community acquired pneumonia) 02/15/2015  . CKD stage 3 due to type 2 diabetes mellitus ([place]) 11/14/2017  . Colon polyps   . Diverticulosis   . GERD (gastroesophageal reflux disease)   . Hairy cell leukemia ([facility]) 2006   recurrent, seizure on rituxan, now on cladribine ([place])  . History of pneumonia 2000's   "once" (07/07/2012)  . History of shingles   . HLD (hyperlipidemia)   . Hypertension   . Pneumonia   . Shortness of breath dyspnea   . Systolic murmur 77/41/2878  . Type 2 diabetes, controlled, with retinopathy ([place])     Past Surgical History:   Procedure Laterality Date  . [place] VITRECTOMY WITH 20 GAUGE MVR PORT FOR MACULAR HOLE  07/07/2012   Procedure: 25 GAUGE PARS PLANA VITRECTOMY WITH 20 GAUGE MVR PORT FOR MACULAR HOLE;  Surgeon: Hayden Pedro, MD;  Location: [facility];  Service: Ophthalmology;  Laterality: Left;  . BONE MARROW BIOPSY  2016  . CARDIOVASCULAR STRESS TEST  2013   treadmill - no evidence ischemia, EF 61%  . CATARACT EXTRACTION W/ INTRAOCULAR LENS  IMPLANT, BILATERAL  ~ 2010  . COLONOSCOPY  2014   [place] WNL no rpt needed, h/o polyps  . EYE SURGERY Left 06/2012   laser surgery  . GAS INSERTION  07/07/2012   Procedure: INSERTION OF GAS;  Surgeon: Hayden Pedro, MD;  Location: [facility] [place];  Service: Ophthalmology;  Laterality: Left;  [id number]  . PERIPHERAL VASCULAR CATHETERIZATION N/A 02/23/2015   Procedure: Glori Luis Cath Insertion;  Surgeon: Algernon Huxley, MD;  Location: [facility];  Service: Cardiovascular;  Laterality: N/A;  . SERUM PATCH  07/07/2012   Procedure: SERUM PATCH;  Surgeon: Hayden Pedro, MD;  Location: [facility];  Service: Ophthalmology;  Laterality: Left;  . SKIN CANCER EXCISION     "all over my face" (07/07/2012)    Family History  Problem Relation Age of Onset  . Dementia Mother   . Heart failure Father 74  . Cancer  Sister        breast  . Diabetes Paternal Uncle   . Diabetes Paternal Aunt   . CAD Brother 67       MI  . Stroke Neg Hx     Social History:  reports that he quit smoking about 49 years ago. His smoking use included cigarettes. He has a 50.00 pack-year smoking history. He has never used smokeless tobacco. He reports that he drinks alcohol. He reports that he does not use drugs.  He works at [employer] 20-32 hours/week.  The patient 's wife has had 3 hip operations.  She underwent hip replacement 03/06/2016.  His wife is in [place].  He states that they either eat out or bring food in.  His best contact number is his cell: [phone]. Patient has a daughter [name]). He  is alone today.  Allergies:  Allergies  Allergen Reactions  . Rituximab Rash    Chest tightness Chest tightness  . Blood-Group Specific Substance Other (See Comments)    Had a post transfusion reaction of red blood cells; NOW REQUIRES WASHED BLOOD CELLS Had a post transfusion reaction of red blood cells; NOW REQUIRES WASHED BLOOD CELLS  . Primaxin [Imipenem] Other (See Comments)    Possible allergy Possible allergy  . Voriconazole Other (See Comments)  . Sulfa Antibiotics Itching and Rash  . Sulfacetamide Sodium Itching and Rash  Possible allergy to Zoloft, allopurinol, and a chemotherapy medication.  He tolerates Dapsone.  Current Medications: Current Outpatient Medications  Medication Sig Dispense Refill  . ACCU-CHEK AVIVA PLUS test strip USE TO CHECK SUGAR TWICE DAILY 200 each 1  . acyclovir (ZOVIRAX) 400 MG tablet TAKE 2 TABLETS BY MOUTH TWICE DAILY 120 tablet 3  . aspirin EC 81 MG tablet Take 81 mg by mouth daily.    Marland Kitchen doxazosin (CARDURA) 1 MG tablet TAKE 1 TABLET DAILY 30 tablet 4  . HUMULIN N 100 UNIT/ML injection INJECT 39 UNITS SUB-Q TWICE DAILY BEFOREA MEAL 20 mL 6  . hydrochlorothiazide (HYDRODIURIL) 25 MG tablet TAKE ONE TABLET BY MOUTH EVERY DAY 30 tablet 5  . insulin regular (HUMULIN R) 100 units/mL injection Inject 0.1 mLs (10 Units total) into the skin 2 (two) times daily before a meal. 10 mL 5  . lidocaine-prilocaine (EMLA) cream Apply 1 application topically as needed. 30 g 3  . lisinopril (PRINIVIL,ZESTRIL) 20 MG tablet TAKE ONE TABLET BY MOUTH TWICE DAILY 60 tablet 5  . metFORMIN (GLUCOPHAGE) 1000 MG tablet TAKE ONE TABLET TWICE DAILY 180 tablet 3  . MICROLET LANCETS MISC USE AS DIRECTED 200 each 3  . pravastatin (PRAVACHOL) 20 MG tablet TAKE 1 TABLET BY MOUTH NIGHTLY 30 tablet 5  . traZODone (DESYREL) 50 MG tablet TAKE 1/2 TO 1 TABLET BY MOUTH AT BEDTIMEAS NEEDED FOR SLEEP 30 tablet 0  . vitamin B-12 (CYANOCOBALAMIN) 1000 MCG tablet Take 1 tablet (1,000 mcg  total) by mouth daily. 90 tablet 2  . vitamin E 400 UNIT capsule Take 200 Units by mouth daily.     . tamsulosin (FLOMAX) 0.4 MG CAPS capsule TAKE 1 CAPSULE BY MOUTH EVERY DAY (Patient not taking: Reported on 07/23/2018) 30 capsule 3   Current Facility-Administered Medications  Medication Dose Route Frequency Provider Last Rate Last Dose  . Tbo-Filgrastim (GRANIX) injection 480 mcg  480 mcg Subcutaneous Once Lequita Asal, MD       Facility-Administered Medications Ordered in Other Visits  Medication Dose Route Frequency Provider Last Rate Last Dose  . heparin  lock flush 100 unit/mL  500 Units Intravenous Once [place], Melissa C, MD      . sodium chloride 0.9 % injection 10 mL  10 mL Intravenous PRN Lequita Asal, MD   10 mL at 03/03/15 0903  . sodium chloride 0.9 % injection 10 mL  10 mL Intracatheter PRN Nolon Stalls C, MD   10 mL at 03/10/15 1410  . sodium chloride flush (NS) 0.9 % injection 10 mL  10 mL Intravenous PRN Lequita Asal, MD   10 mL at 07/23/18 1517    Review of Systems  Constitutional: Positive for weight loss (2 pound). Negative for chills, diaphoresis, fever and malaise/fatigue.       "I'm here".  HENT: Negative.  Negative for congestion, ear discharge, ear pain, nosebleeds, sinus pain, sore throat and tinnitus.   Eyes: Negative.  Negative for blurred vision, double vision, pain, discharge and redness.  Respiratory: Negative for cough, hemoptysis, sputum production, shortness of breath and wheezing.   Cardiovascular: Negative.  Negative for chest pain, palpitations, orthopnea, claudication, leg swelling and PND.  Gastrointestinal: Negative for abdominal pain, blood in stool, constipation, diarrhea, melena, nausea and vomiting.  Genitourinary: Negative.  Negative for dysuria, frequency, hematuria and urgency.  Musculoskeletal: Positive for back pain (lower). Negative for falls, joint pain, myalgias and neck pain.  Skin: Negative.  Negative for  itching and rash.  Neurological: Negative.  Negative for dizziness, tingling, tremors, sensory change, speech change, weakness and headaches.  Endo/Heme/Allergies: Does not bruise/bleed easily.       Diabetes.   Psychiatric/Behavioral: Negative.  Negative for depression. The patient is not nervous/anxious and does not have insomnia.   All other systems reviewed and are negative.  Performance status (ECOG): 1  Physical Exam: Blood pressure (!) 144/77, pulse 83, temperature (!) 97.2 F (36.2 C), temperature source Tympanic, resp. rate 18, weight 220 lb 8 oz (100 kg).  GENERAL:  Well developed, well nourished, gentleman sitting comfortably in the exam room in no acute distress. MENTAL STATUS:  Alert and oriented to person, place and time. HEAD:  White hair.  Normocephalic, atraumatic, face symmetric, no Cushingoid features. EYES:  Glasses.  Blue eyes.  Pupils equal round and reactive to light and accomodation.  No conjunctivitis or scleral icterus. ENT:  Oropharynx clear without lesion.  Tongue normal. Mucous membranes moist.  RESPIRATORY:  Clear to auscultation without rales, wheezes or rhonchi. CARDIOVASCULAR:  Regular rate and rhythm without murmur, rub or gallop. ABDOMEN:  Soft, non-tender, with active bowel sounds, and no hepatosplenomegaly.  No masses. SKIN:  No rashes, ulcers or lesions. EXTREMITIES: No edema, no skin discoloration or tenderness.  No palpable cords. LYMPH NODES: No palpable cervical, supraclavicular, axillary or inguinal adenopathy  NEUROLOGICAL: Unremarkable. PSYCH:  Appropriate.    Infusion on 07/23/2018  Component Date Value Ref Range Status  . Sodium 07/23/2018 137  135 - 145 mmol/L Final  . Potassium 07/23/2018 3.9  3.5 - 5.1 mmol/L Final  . Chloride 07/23/2018 104  98 - 111 mmol/L Final  . CO2 07/23/2018 26  22 - 32 mmol/L Final  . Glucose, Bld 07/23/2018 259* 70 - 99 mg/dL Final  . BUN 07/23/2018 23  8 - 23 mg/dL Final  . Creatinine, Ser 07/23/2018 1.48*  0.61 - 1.24 mg/dL Final  . Calcium 07/23/2018 9.0  8.9 - 10.3 mg/dL Final  . Total Protein 07/23/2018 7.4  6.5 - 8.1 g/dL Final  . Albumin 07/23/2018 3.9  3.5 - 5.0 g/dL Final  . AST  07/23/2018 21  15 - 41 U/L Final  . ALT 07/23/2018 18  0 - 44 U/L Final  . Alkaline Phosphatase 07/23/2018 59  38 - 126 U/L Final  . Total Bilirubin 07/23/2018 0.7  0.3 - 1.2 mg/dL Final  . GFR calc non Af Amer 07/23/2018 44* >60 mL/min Final  . GFR calc Af Amer 07/23/2018 51* >60 mL/min Final  . Anion gap 07/23/2018 7  5 - 15 Final   Performed at [facility], [address] [place], [place] [address]  . WBC 07/23/2018 3.2* 4.0 - 10.5 K/uL Final  . RBC 07/23/2018 2.27* 4.22 - 5.81 MIL/uL Final  . Hemoglobin 07/23/2018 8.4* 13.0 - 17.0 g/dL Final  . HCT 07/23/2018 24.9* 39.0 - 52.0 % Final  . MCV 07/23/2018 109.7* 80.0 - 100.0 fL Final  . MCH 07/23/2018 37.0* 26.0 - 34.0 pg Final  . MCHC 07/23/2018 33.7  30.0 - 36.0 g/dL Final  . RDW 07/23/2018 14.2  11.5 - 15.5 % Final  . Platelets 07/23/2018 90* 150 - 400 K/uL Final  . nRBC 07/23/2018 0.0  0.0 - 0.2 % Final  . Neutrophils Relative % 07/23/2018 68  % Final  . Neutro Abs 07/23/2018 2.2  1.7 - 7.7 K/uL Final  . Lymphocytes Relative 07/23/2018 23  % Final  . Lymphs Abs 07/23/2018 0.7  0.7 - 4.0 K/uL Final  . Monocytes Relative 07/23/2018 7  % Final  . Monocytes Absolute 07/23/2018 0.2  0.1 - 1.0 K/uL Final  . Eosinophils Relative 07/23/2018 2  % Final  . Eosinophils Absolute 07/23/2018 0.1  0.0 - 0.5 K/uL Final  . Basophils Relative 07/23/2018 0  % Final  . Basophils Absolute 07/23/2018 0.0  0.0 - 0.1 K/uL Final  . Immature Granulocytes 07/23/2018 0  % Final  . Abs Immature Granulocytes 07/23/2018 0.00  0.00 - 0.07 K/uL Final   Performed at [facility], [address] [place],  [address]    Assessment:  OLAJUWON FOSDICK is a 83 y.o. 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  He was diagnosed with hairy cell leukemia in 2006.  He received  cladribine 11/06-11/05/2005.  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   Bone marrow aspirate and biopsy on 02/07/2015 revealed a extensive marrow involvement by recurrent hairy cell leukemia (approximately 80% of cells in the core). There was a monoclonal plasma cell infiltrate (approximately 10%), compatible with a plasma cell neoplasm. Marrow was hypercellular (40-50%) with markedly diminished residual trilineage hematopoiesis and mild multilineage dyspoiesis.  Peripheral smear revealed leukopenia (WBC 1900) with 2% atypical lymphoid cells compatible with hairy cell leukemia. There was moderate anemia with anisopoikilocytosis. 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  FISH studies for MDS were negative.  Cytogenetics were normal (46, XY).  He has an unclear allergy history.  He may be allergic to Primaxin, Zoloft, allopurinol, and a chemotherapy medication.  Reaction occurred in 06/2005.  He is allergic to sulfa.  He is not allergic to dapsone.  He requires washed RBCs.  He is on prophylactic acyclovir.  Dapsone and voriconazole were discontinued.  He has a history of shingles prior to prophylactic acyclovir.  He is 34 months status post cladribine (began 03/03/2015).  His counts plateaued on 07/25/2015 and have subsequently decreased.  Labs on 07/25/2015 revealed a normal B12 (894), folate (60.5), and TSH (2.562).  Ferritin was 50 on 04/26/2016.  Labs on 08/22/2016  revealed a normal B12, folate, and TSH.  He is on oral B12.  B12 was 693 on 05/27/2017 and 469 on 04/23/2018.  Bone marrow on 11/03/2015 revealed persistent plasma cell neoplasm, now with mildy atypical monoclonal plasma cells estimated at 20-30%.   There was no morphologic evidence of residual hairy cell leukemia.  There was a minute population (0.03%) with hairy cell immunophenotype detected by flow.  Marrow was  normocellular for age with relative erythroid hyperplasia, relative myeloid hypoplasia, mild dyspoiesis, adequate megakaryocytes and no increased blasts.  There was no significant increase in marrow reticulin fibers.  Iron was present.  FISH studies for MDS were negative.  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  Cytogenetics were normal (46, XY).  Bone marrow on 03/13/2016 was normocellular to marginally hypercellular for age (20-40%) with relative erythroid hyperplasia and mild dyserythropoiesis, decreased granulopoiesis and adequate megakaryocytes.  There was a persistent 15-20% plasma cell neoplasm.  There was no overt increase in reticulin fibrosis.  Storage iron was present.  Flow cytometry revealed abnormal/monocytic plasma cell population (3% sample), consistent with a plasma cell neoplastic process.  Cytogenetics were normal (46, XY).  FISH studies for myeloma revealed a CCND1/IGH translocation, t(11;14) in 53% of nuclei.  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  There was no evidence of leukemia, lymphoma, or high grade dysplasia.  Cytogenetics were normal (46, XY).  SPEP has been followed: 1.0 gm/dL on 02/03/2015, 1.4 on 07/25/2015, 1.3 on 11/17/2015, 1.2 on 06/28/2016, 1.0 on 09/20/2016, 0.8 on 04/29/2017, 0.7 [facility]) on 05/12/2017, 1.2 on 09/29/2017, 1.3 on 01/19/2018, and 1.2 on 04/23/2018.    Kappa free light chains have been followed: 47.63 (ratio 3.12) on 02/03/2015, 54.9 (ratio 3.14) on 02/07/2015, 46.23 (ratio 3.68) on 07/25/2015, 50.5 (ratio 4.82) on 11/17/2015, 44.3 (ratio 4.22) on 09/01/2017, and 48.9 (ratio 4.29) on 01/19/2018.  Beta2-microglobulin was 2.5 on 11/17/2015.  24 hour urine on 11/21/2015 revealed 234.8 mg protein in 24 hours (no monoclonal protein).  Bone survey on 11/24/2015 revealed no lytic lesions.  Epo level was 329.6 on 03/29/2016.  He  began a trial of Procrit.  He received 10,000 units of Procrit weekly (04/26/2016 - 06/14/2016).  Hemoglobin ranged between 8.4 and 8.9 without trend.  He receives PRBCs (washed and irradiated) to maintain a hemoglobin > 9.0.  He has received 10 units to date (last on 12/12/2017).  He is s/p 1 cycle ofRevlimid (10 mg) and Decadron(06/29/2016 - 07/19/2016). Cycle #1 was complicated by anemia and neutropenia. He has received 4 units of PRBCs(1 unit:07/22/2016 and 2 units: 08/07/2016, 1 unit on 01/14/2017, and 1 unit on 02/27/2017; 1 unit on 04/30/2017). He received 6 days ofGCSF.  He is s/p 5 cycles of Revlimid (5 mg) and Decadron (09/14/2016 - 04/01/2017).  Revlimid was on temporary hold beginning 03/04/2017.  Revlimid was discontinued on 05/27/2017.  He began weekly GCSF on 11/05/2016 (last 06/10/2017).  He receives GCSF if his ANC is <= 1500.  He is on aspirin 81 mg a day prophylaxis.  PET scan on 03/14/2016 revealed no evidence of hypermetabolic lymphadenopathy or focal skeletal lesions to suggest marrow or cortical bone involvement.  Echo on 06/06/2017 revealed an EF of 65-70%.  Mitral valve was calcified.  The left atrium was mildly dilated.  He has basal cell carcinoma of the left nasal ala s/p biopsy.  He is scheduled to begin radiation (electron beam 5000 cGy over 5 weeks).  Symptomatically,  he denies any B symptoms.  He denies any shortness of breath or chest pain.  Hemoglobin is 8.4.  Platelet count is 90,000.  ANC is 2200.  M-spike is 1.2 (stable).  Plan: 1. Labs today;  CBC with diff, CMP, SPEP, FLCA. 2.  Smoldering multiple myeloma:  Patient is asymptomatic.  M-spike is 1.2.  Continue surveillance. 3.  Hairy cell leukemia:  Patient is asymptomatic.  Patient is remission.  Continue surveillance. 4.  Probable myelodysplastic syndrome:  Anemia requiring infrequent PRBC transfusions.  Thrombocytopenia is stable.  Neutropenia has esolved off Revlimid. 5.  B12  deficiency:  B12 level was 469 on 04/23/2018.  Continue oral B12. 6.  Anemia:  Hemoglobin is 8.4.  RTC on 07/24/2018 for type and screen.  RTC on 07/27/2018 for 1 unit of washed and irradiated PRBCs.  Transfuse washed RBCs if hemoglobin < 9 secondary to symptoms.  Patient to contact clinic between appointments if any symptoms for CBC and possible transfusion. 7.  Discuss plan of move to [place].  Patient would like to remain in [place].  Discuss follow-up with Dr. Janese Banks. 8.  RTC in 3 months for MD (Dr Janese Banks) assessment and labs (CBC with diff, CMP, SPEP, FLCA).     Honor Loh, NP  07/23/2018, 3:44 PM   I saw and evaluated the patient, participating in the key portions of the service and reviewing pertinent diagnostic studies and records.  I reviewed the nurse practitioner's note and agree with the findings and the plan.  The assessment and plan were discussed with the patient.  Multiple questions were asked by the patient and answered.   Nolon Stalls, MD 07/23/2018,3:44 PM

## 2018-07-23 NOTE — Progress Notes (Signed)
Pt in for follow up, reports "still feeling very fatigued and bad all the time".

## 2018-07-24 ENCOUNTER — Other Ambulatory Visit: Payer: Self-pay

## 2018-07-24 ENCOUNTER — Inpatient Hospital Stay: Payer: Medicare Other

## 2018-07-24 DIAGNOSIS — C9141 Hairy cell leukemia, in remission: Secondary | ICD-10-CM

## 2018-07-24 DIAGNOSIS — D469 Myelodysplastic syndrome, unspecified: Secondary | ICD-10-CM | POA: Diagnosis not present

## 2018-07-24 DIAGNOSIS — D509 Iron deficiency anemia, unspecified: Secondary | ICD-10-CM

## 2018-07-24 LAB — KAPPA/LAMBDA LIGHT CHAINS
Kappa free light chain: 66.5 mg/L — ABNORMAL HIGH (ref 3.3–19.4)
Kappa, lambda light chain ratio: 6.79 — ABNORMAL HIGH (ref 0.26–1.65)
Lambda free light chains: 9.8 mg/L (ref 5.7–26.3)

## 2018-07-27 ENCOUNTER — Inpatient Hospital Stay: Payer: Medicare Other

## 2018-07-27 DIAGNOSIS — D472 Monoclonal gammopathy: Secondary | ICD-10-CM

## 2018-07-27 DIAGNOSIS — D4620 Refractory anemia with excess of blasts, unspecified: Secondary | ICD-10-CM

## 2018-07-27 DIAGNOSIS — D469 Myelodysplastic syndrome, unspecified: Secondary | ICD-10-CM | POA: Diagnosis not present

## 2018-07-27 DIAGNOSIS — D509 Iron deficiency anemia, unspecified: Secondary | ICD-10-CM

## 2018-07-27 DIAGNOSIS — C9000 Multiple myeloma not having achieved remission: Secondary | ICD-10-CM

## 2018-07-27 LAB — PROTEIN ELECTROPHORESIS, SERUM
A/G Ratio: 1.3 (ref 0.7–1.7)
Albumin ELP: 3.9 g/dL (ref 2.9–4.4)
Alpha-1-Globulin: 0.2 g/dL (ref 0.0–0.4)
Alpha-2-Globulin: 0.7 g/dL (ref 0.4–1.0)
Beta Globulin: 0.8 g/dL (ref 0.7–1.3)
Gamma Globulin: 1.3 g/dL (ref 0.4–1.8)
Globulin, Total: 2.9 g/dL (ref 2.2–3.9)
M-Spike, %: 1.2 g/dL — ABNORMAL HIGH
Total Protein ELP: 6.8 g/dL (ref 6.0–8.5)

## 2018-07-27 LAB — PREPARE RBC (CROSSMATCH)

## 2018-07-27 MED ORDER — HEPARIN SOD (PORK) LOCK FLUSH 100 UNIT/ML IV SOLN
500.0000 [IU] | Freq: Once | INTRAVENOUS | Status: AC
  Administered 2018-07-27: 500 [IU] via INTRAVENOUS
  Filled 2018-07-27: qty 5

## 2018-07-27 MED ORDER — SODIUM CHLORIDE 0.9% IV SOLUTION
250.0000 mL | Freq: Once | INTRAVENOUS | Status: AC
  Administered 2018-07-27: 250 mL via INTRAVENOUS
  Filled 2018-07-27: qty 250

## 2018-07-27 MED ORDER — DIPHENHYDRAMINE HCL 25 MG PO CAPS
25.0000 mg | ORAL_CAPSULE | Freq: Once | ORAL | Status: AC
  Administered 2018-07-27: 25 mg via ORAL
  Filled 2018-07-27: qty 1

## 2018-07-27 MED ORDER — ACETAMINOPHEN 325 MG PO TABS
650.0000 mg | ORAL_TABLET | Freq: Once | ORAL | Status: AC
  Administered 2018-07-27: 650 mg via ORAL
  Filled 2018-07-27: qty 2

## 2018-07-28 ENCOUNTER — Telehealth: Payer: Self-pay | Admitting: Family Medicine

## 2018-07-28 LAB — BPAM RBC
Blood Product Expiration Date: 201912092025
ISSUE DATE / TIME: 201912090945
Unit Type and Rh: 600

## 2018-07-28 LAB — TYPE AND SCREEN
ABO/RH(D): A NEG
Antibody Screen: NEGATIVE
Unit division: 0

## 2018-07-28 MED ORDER — FAMOTIDINE 20 MG PO TABS: 20.0000 mg | ORAL_TABLET | Freq: Every day | ORAL | Status: DC

## 2018-07-28 NOTE — Telephone Encounter (Signed)
Please call patient - received notice from his insurance that his ranitidine (zantac) may have been affected by recent recall. I don't see that he has been on ranitidine - was he taking this? If so, I recommend he stop zantac and start pepcid 20mg  QHS in its place.

## 2018-07-29 NOTE — Telephone Encounter (Signed)
Left message on vm per dpr relaying Dr. Synthia Innocent message and instructions below.

## 2018-07-29 NOTE — Telephone Encounter (Signed)
Left message on vm for pt to call back.  Need to relay Dr. G's message.  

## 2018-07-29 NOTE — Telephone Encounter (Signed)
Pt returned call

## 2018-08-04 ENCOUNTER — Telehealth: Payer: Self-pay

## 2018-08-04 NOTE — Telephone Encounter (Signed)
Received faxed new rx request from [facility] stating "patient said Dr. Danise Mina advised him to start omeprazole.  Would be cheaper by prescription".

## 2018-08-04 NOTE — Telephone Encounter (Signed)
I had advised pepcid (famotidine) not omeprazole. Does he want to start omeprazole? Need more details.

## 2018-08-05 MED ORDER — FAMOTIDINE 20 MG PO TABS: 20.0000 mg | ORAL_TABLET | Freq: Every day | ORAL | 3 refills | Status: DC

## 2018-08-05 NOTE — Telephone Encounter (Signed)
Plz notify pepcid sent in.

## 2018-08-05 NOTE — Telephone Encounter (Signed)
Spoke with pt asking for clarification on the message we received from the pharmacy.  Says he got confused on the name of the med.  He just knew Dr. Darnell Level wanted him to get the Pepcid OTC but he thinks it will be cheaper with rx.

## 2018-08-06 NOTE — Telephone Encounter (Signed)
Left message on vm per dpr notifying him Dr. Darnell Level sent the [facility] rx to the pharmacy for him.

## 2018-08-06 NOTE — Telephone Encounter (Signed)
Left message on vm per dpr relaying Dr. G's message.  

## 2018-08-06 NOTE — Telephone Encounter (Signed)
Pt returning your call

## 2018-08-10 ENCOUNTER — Other Ambulatory Visit: Payer: Self-pay | Admitting: Family Medicine

## 2018-08-25 DIAGNOSIS — E119 Type 2 diabetes mellitus without complications: Secondary | ICD-10-CM | POA: Diagnosis not present

## 2018-08-27 ENCOUNTER — Encounter: Payer: Self-pay | Admitting: Family Medicine

## 2018-08-27 NOTE — Progress Notes (Signed)
BP 134/64 (BP Location: Left Arm, Patient Position: Sitting, Cuff Size: Large)   Pulse 73   Temp 97.9 F (36.6 C) (Oral)   Ht 5' 6.25" (1.683 m)   Wt 221 lb 4 oz (100.4 kg)   SpO2 97%   BMI 35.44 kg/m    CC: 6 mo f/u visit Subjective:    Patient ID: Alex Smith, male    DOB: Feb 01, 1935, 84 y.o.   MRN: [id number]  HPI: Alex Smith is a 84 y.o. male presenting on 08/28/2018 for Follow-up (Here for 6 mo f/u.)   Takes aleve PM nightly.   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 Pancytopenia with Hgb 8.4 last check (07/2018) - had tranfusion after this. Stays tired. Denies significant dyspnea or dizziness or lightheadedness.   DM - does regularly check sugars daily - fasting this morning 170s, but averages 120-130. Compliant with antihyperglycemic regimen which includes: humulin R 10u BID with meals, humulin N 39 u BID, metformin 1000mg  bid. Denies low sugars or hypoglycemic symptoms. Occasional foot paresthesias. He is taking vitamin b12 1026mcg daily. Last diabetic eye exam 08/2018 [facility]) - good report h/o macular hole. Pneumovax: 2006. Prevnar: 2015. Glucometer brand: accucheck. DSME: unsure. Lab Results  Component Value Date   HGBA1C 5.7 (A) 08/28/2018   Diabetic Foot Exam - Simple   Simple Foot Form Diabetic Foot exam was performed with the following findings:  Yes 08/28/2018  2:13 PM  Visual Inspection No deformities, no ulcerations, no other skin breakdown bilaterally:  Yes Sensation Testing See comments:  Yes Pulse Check Posterior Tibialis and Dorsalis pulse intact bilaterally:  Yes Comments Diminished sensation to monofilament testing of right sole    Lab Results  Component Value Date   MICROALBUR 1.0 10/05/2014        Relevant past medical, surgical, family and social history reviewed and updated as indicated. Interim medical history since our last visit reviewed. Allergies and medications  reviewed and updated. Outpatient Medications Prior to Visit  Medication Sig Dispense Refill  . acyclovir (ZOVIRAX) 400 MG tablet TAKE 2 TABLETS BY MOUTH TWICE DAILY 120 tablet 3  . aspirin EC 81 MG tablet Take 81 mg by mouth daily.    Marland Kitchen doxazosin (CARDURA) 1 MG tablet TAKE 1 TABLET DAILY 30 tablet 4  . famotidine (PEPCID) 20 MG tablet Take 1 tablet (20 mg total) by mouth at bedtime. 90 tablet 3  . HUMULIN N 100 UNIT/ML injection INJECT 39 UNITS SUB-Q TWICE DAILY BEFOREA MEAL 20 mL 6  . hydrochlorothiazide (HYDRODIURIL) 25 MG tablet TAKE ONE TABLET BY MOUTH EVERY DAY 30 tablet 5  . insulin regular (HUMULIN R) 100 units/mL injection Inject 0.1 mLs (10 Units total) into the skin 2 (two) times daily before a meal. 10 mL 5  . lidocaine-prilocaine (EMLA) cream Apply 1 application topically as needed. 30 g 3  . lisinopril (PRINIVIL,ZESTRIL) 20 MG tablet TAKE ONE (1) TABLET BY MOUTH TWO TIMES PER DAY 60 tablet 5  . metFORMIN (GLUCOPHAGE) 1000 MG tablet TAKE ONE TABLET TWICE DAILY 180 tablet 3  . pravastatin (PRAVACHOL) 20 MG tablet TAKE 1 TABLET BY MOUTH NIGHTLY 30 tablet 5  . tamsulosin (FLOMAX) 0.4 MG CAPS capsule TAKE 1 CAPSULE BY MOUTH EVERY DAY 30 capsule 3  . traZODone (DESYREL) 50 MG tablet TAKE 1/2 TO 1 TABLET BY MOUTH AT BEDTIMEAS NEEDED FOR SLEEP 30 tablet 0  . vitamin B-12 (CYANOCOBALAMIN) 1000 MCG tablet Take 1 tablet (  1,000 mcg total) by mouth daily. 90 tablet 2  . vitamin E 400 UNIT capsule Take 200 Units by mouth daily.     Marland Kitchen ACCU-CHEK AVIVA PLUS test strip USE TO CHECK SUGAR TWICE DAILY 200 each 1  . MICROLET LANCETS MISC USE AS DIRECTED 200 each 3   Facility-Administered Medications Prior to Visit  Medication Dose Route Frequency Provider Last Rate Last Dose  . heparin lock flush 100 unit/mL  500 Units Intravenous Once Corcoran, Melissa C, MD      . sodium chloride 0.9 % injection 10 mL  10 mL Intravenous PRN Lequita Asal, MD   10 mL at 03/03/15 0903  . sodium chloride 0.9 %  injection 10 mL  10 mL Intracatheter PRN Nolon Stalls C, MD   10 mL at 03/10/15 1410  . sodium chloride flush (NS) 0.9 % injection 10 mL  10 mL Intravenous PRN Lequita Asal, MD   10 mL at 07/23/18 1517  . Tbo-Filgrastim (GRANIX) injection 480 mcg  480 mcg Subcutaneous Once Lequita Asal, MD         Per HPI unless specifically indicated in ROS section below Review of Systems Objective:    BP 134/64 (BP Location: Left Arm, Patient Position: Sitting, Cuff Size: Large)   Pulse 73   Temp 97.9 F (36.6 C) (Oral)   Ht 5' 6.25" (1.683 m)   Wt 221 lb 4 oz (100.4 kg)   SpO2 97%   BMI 35.44 kg/m   Wt Readings from Last 3 Encounters:  08/28/18 221 lb 4 oz (100.4 kg)  07/23/18 220 lb 8 oz (100 kg)  07/09/18 224 lb 12.1 oz (101.9 kg)    Physical Exam Vitals signs and nursing note reviewed.  Constitutional:      General: He is not in acute distress.    Appearance: He is well-developed.  HENT:     Head: Normocephalic and atraumatic.     Right Ear: External ear normal.     Left Ear: External ear normal.     Mouth/Throat:     Mouth: Mucous membranes are moist.     Pharynx: Oropharynx is clear. No oropharyngeal exudate.  Eyes:     General: No scleral icterus.    Conjunctiva/sclera: Conjunctivae normal.     Pupils: Pupils are equal, round, and reactive to light.  Neck:     Musculoskeletal: Normal range of motion and neck supple.  Cardiovascular:     Rate and Rhythm: Normal rate and regular rhythm.     Heart sounds: Murmur (2/6 systolic) present.  Pulmonary:     Effort: Pulmonary effort is normal. No respiratory distress.     Breath sounds: Normal breath sounds. No wheezing or rales.  Musculoskeletal:     Right lower leg: No edema.     Left lower leg: No edema.     Comments: See HPI for foot exam if done  Lymphadenopathy:     Cervical: No cervical adenopathy.  Skin:    General: Skin is warm and dry.     Findings: No rash.       Results for orders placed or  performed in visit on 08/28/18  POCT glycosylated hemoglobin (Hb A1C)  Result Value Ref Range   Hemoglobin A1C 5.7 (A) 4.0 - 5.6 %   HbA1c POC (<> result, manual entry)     HbA1c, POC (prediabetic range)     HbA1c, POC (controlled diabetic range)     Assessment & Plan:   Problem  List Items Addressed This Visit    Type 2 diabetes, controlled, with retinopathy ([place]) - Primary    Stable period based on A1c however anticipate this is an underestimation of glycemic control given chronic anemia. Will check fructosamine next labwork. Advised he monitor sugars based on daily cbg readings.       Relevant Orders   POCT glycosylated hemoglobin (Hb A1C) (Completed)   Smoldering myeloma (HCC)   Pancytopenia due to antineoplastic chemotherapy ([place])    Ongoing, followed closely by oncology.       Myelodysplastic syndrome, low grade (HCC)   Myelodysplasia present in bone marrow (HCC)   Hairy cell leukemia, in remission ([place])   CKD stage 3 due to type 2 diabetes mellitus ([place])    Suggested tylenol PM in place of aleve PM at night.           Meds ordered this encounter  Medications  . glucose blood (ACCU-CHEK AVIVA PLUS) test strip    Sig: USE TO CHECK SUGAR DAILY E11.319    Dispense:  200 each    Refill:  3  . MICROLET LANCETS MISC    Sig: USE AS DIRECTED to check sugars daily E11.319    Dispense:  200 each    Refill:  3   Orders Placed This Encounter  Procedures  . POCT glycosylated hemoglobin (Hb A1C)    Follow up plan: Return in about 6 months (around 02/26/2019).  Ria Bush, MD

## 2018-08-28 ENCOUNTER — Ambulatory Visit (INDEPENDENT_AMBULATORY_CARE_PROVIDER_SITE_OTHER): Payer: Medicare Other | Admitting: Family Medicine

## 2018-08-28 ENCOUNTER — Encounter: Payer: Self-pay | Admitting: Family Medicine

## 2018-08-28 ENCOUNTER — Other Ambulatory Visit: Payer: Self-pay | Admitting: Family Medicine

## 2018-08-28 VITALS — BP 134/64 | HR 73 | Temp 97.9°F | Ht 66.25 in | Wt 221.2 lb

## 2018-08-28 DIAGNOSIS — Z794 Long term (current) use of insulin: Secondary | ICD-10-CM

## 2018-08-28 DIAGNOSIS — D4620 Refractory anemia with excess of blasts, unspecified: Secondary | ICD-10-CM

## 2018-08-28 DIAGNOSIS — C9141 Hairy cell leukemia, in remission: Secondary | ICD-10-CM | POA: Diagnosis not present

## 2018-08-28 DIAGNOSIS — D46Z Other myelodysplastic syndromes: Secondary | ICD-10-CM

## 2018-08-28 DIAGNOSIS — D472 Monoclonal gammopathy: Secondary | ICD-10-CM

## 2018-08-28 DIAGNOSIS — T451X5A Adverse effect of antineoplastic and immunosuppressive drugs, initial encounter: Secondary | ICD-10-CM

## 2018-08-28 DIAGNOSIS — C9000 Multiple myeloma not having achieved remission: Secondary | ICD-10-CM | POA: Diagnosis not present

## 2018-08-28 DIAGNOSIS — N183 Chronic kidney disease, stage 3 (moderate): Secondary | ICD-10-CM

## 2018-08-28 DIAGNOSIS — E1122 Type 2 diabetes mellitus with diabetic chronic kidney disease: Secondary | ICD-10-CM | POA: Diagnosis not present

## 2018-08-28 DIAGNOSIS — E11319 Type 2 diabetes mellitus with unspecified diabetic retinopathy without macular edema: Secondary | ICD-10-CM

## 2018-08-28 DIAGNOSIS — D61810 Antineoplastic chemotherapy induced pancytopenia: Secondary | ICD-10-CM

## 2018-08-28 LAB — POCT GLYCOSYLATED HEMOGLOBIN (HGB A1C): Hemoglobin A1C: 5.7 % — AB (ref 4.0–5.6)

## 2018-08-28 MED ORDER — MICROLET LANCETS MISC: 3 refills | Status: DC

## 2018-08-28 MED ORDER — GLUCOSE BLOOD VI STRP: ORAL_STRIP | 3 refills | Status: DC

## 2018-08-28 NOTE — Assessment & Plan Note (Signed)
Suggested tylenol PM in place of aleve PM at night.

## 2018-08-28 NOTE — Assessment & Plan Note (Addendum)
Stable period based on A1c however anticipate this is an underestimation of glycemic control given chronic anemia. Will check fructosamine next labwork. Advised he monitor sugars based on daily cbg readings.

## 2018-08-28 NOTE — Patient Instructions (Addendum)
You are doing well today Continue current medicines. Return in 6 months for physical/wellness visit.  Bring me form from local gym.  Try tylenol PM instead of aleve PM.

## 2018-08-28 NOTE — Telephone Encounter (Signed)
E-scribed refill 

## 2018-08-28 NOTE — Assessment & Plan Note (Signed)
Ongoing, followed closely by oncology.

## 2018-09-08 ENCOUNTER — Other Ambulatory Visit: Payer: Self-pay | Admitting: Family Medicine

## 2018-09-11 ENCOUNTER — Inpatient Hospital Stay: Payer: Medicare Other | Attending: Oncology

## 2018-09-11 DIAGNOSIS — C9000 Multiple myeloma not having achieved remission: Secondary | ICD-10-CM | POA: Insufficient documentation

## 2018-09-11 DIAGNOSIS — Z452 Encounter for adjustment and management of vascular access device: Secondary | ICD-10-CM | POA: Insufficient documentation

## 2018-09-11 DIAGNOSIS — Z95828 Presence of other vascular implants and grafts: Secondary | ICD-10-CM

## 2018-09-11 MED ORDER — SODIUM CHLORIDE 0.9% FLUSH
10.0000 mL | Freq: Once | INTRAVENOUS | Status: AC
  Administered 2018-09-11: 10 mL via INTRAVENOUS
  Filled 2018-09-11: qty 10

## 2018-09-11 MED ORDER — HEPARIN SOD (PORK) LOCK FLUSH 100 UNIT/ML IV SOLN
500.0000 [IU] | Freq: Once | INTRAVENOUS | Status: AC
  Administered 2018-09-11: 500 [IU] via INTRAVENOUS
  Filled 2018-09-11: qty 5

## 2018-10-06 ENCOUNTER — Other Ambulatory Visit: Payer: Self-pay | Admitting: Family Medicine

## 2018-10-22 ENCOUNTER — Encounter: Payer: Self-pay | Admitting: Oncology

## 2018-10-22 ENCOUNTER — Inpatient Hospital Stay: Payer: Medicare Other | Attending: Oncology

## 2018-10-22 ENCOUNTER — Inpatient Hospital Stay: Payer: Medicare Other | Admitting: *Deleted

## 2018-10-22 ENCOUNTER — Other Ambulatory Visit: Payer: Self-pay

## 2018-10-22 ENCOUNTER — Inpatient Hospital Stay: Payer: Medicare Other | Attending: Oncology | Admitting: Oncology

## 2018-10-22 VITALS — BP 146/66 | HR 76 | Temp 98.1°F | Resp 16 | Wt 223.0 lb

## 2018-10-22 DIAGNOSIS — D539 Nutritional anemia, unspecified: Secondary | ICD-10-CM

## 2018-10-22 DIAGNOSIS — D4620 Refractory anemia with excess of blasts, unspecified: Secondary | ICD-10-CM

## 2018-10-22 DIAGNOSIS — D61818 Other pancytopenia: Secondary | ICD-10-CM | POA: Insufficient documentation

## 2018-10-22 DIAGNOSIS — Z87891 Personal history of nicotine dependence: Secondary | ICD-10-CM | POA: Insufficient documentation

## 2018-10-22 DIAGNOSIS — C9141 Hairy cell leukemia, in remission: Secondary | ICD-10-CM | POA: Insufficient documentation

## 2018-10-22 DIAGNOSIS — D472 Monoclonal gammopathy: Secondary | ICD-10-CM

## 2018-10-22 DIAGNOSIS — C9000 Multiple myeloma not having achieved remission: Secondary | ICD-10-CM | POA: Diagnosis not present

## 2018-10-22 LAB — COMPREHENSIVE METABOLIC PANEL
ALT: 21 U/L (ref 0–44)
AST: 22 U/L (ref 15–41)
Albumin: 4.1 g/dL (ref 3.5–5.0)
Alkaline Phosphatase: 59 U/L (ref 38–126)
Anion gap: 7 (ref 5–15)
BUN: 29 mg/dL — ABNORMAL HIGH (ref 8–23)
CO2: 24 mmol/L (ref 22–32)
Calcium: 8.8 mg/dL — ABNORMAL LOW (ref 8.9–10.3)
Chloride: 105 mmol/L (ref 98–111)
Creatinine, Ser: 1.58 mg/dL — ABNORMAL HIGH (ref 0.61–1.24)
GFR calc Af Amer: 47 mL/min — ABNORMAL LOW (ref >60)
GFR calc non Af Amer: 40 mL/min — ABNORMAL LOW (ref >60)
Glucose, Bld: 305 mg/dL — ABNORMAL HIGH (ref 70–99)
Potassium: 4.1 mmol/L (ref 3.5–5.1)
Sodium: 136 mmol/L (ref 135–145)
Total Bilirubin: 0.5 mg/dL (ref 0.3–1.2)
Total Protein: 7.3 g/dL (ref 6.5–8.1)

## 2018-10-22 LAB — CBC WITH DIFFERENTIAL/PLATELET
Abs Immature Granulocytes: 0.02 10*3/uL (ref 0.00–0.07)
Basophils Absolute: 0 10*3/uL (ref 0.0–0.1)
Basophils Relative: 1 %
Eosinophils Absolute: 0.1 10*3/uL (ref 0.0–0.5)
Eosinophils Relative: 2 %
HCT: 24.9 % — ABNORMAL LOW (ref 39.0–52.0)
Hemoglobin: 8.7 g/dL — ABNORMAL LOW (ref 13.0–17.0)
Immature Granulocytes: 1 %
Lymphocytes Relative: 26 %
Lymphs Abs: 0.8 10*3/uL (ref 0.7–4.0)
MCH: 38.7 pg — ABNORMAL HIGH (ref 26.0–34.0)
MCHC: 34.9 g/dL (ref 30.0–36.0)
MCV: 110.7 fL — ABNORMAL HIGH (ref 80.0–100.0)
Monocytes Absolute: 0.2 10*3/uL (ref 0.1–1.0)
Monocytes Relative: 6 %
Neutro Abs: 2 10*3/uL (ref 1.7–7.7)
Neutrophils Relative %: 64 %
Platelets: 89 10*3/uL — ABNORMAL LOW (ref 150–400)
RBC: 2.25 MIL/uL — ABNORMAL LOW (ref 4.22–5.81)
RDW: 14.6 % (ref 11.5–15.5)
WBC: 3.1 10*3/uL — ABNORMAL LOW (ref 4.0–10.5)
nRBC: 0 % (ref 0.0–0.2)

## 2018-10-22 MED ORDER — HEPARIN SOD (PORK) LOCK FLUSH 100 UNIT/ML IV SOLN
500.0000 [IU] | Freq: Once | INTRAVENOUS | Status: AC
  Administered 2018-10-22: 500 [IU] via INTRAVENOUS
  Filled 2018-10-22: qty 5

## 2018-10-22 MED ORDER — SODIUM CHLORIDE 0.9% FLUSH
10.0000 mL | Freq: Once | INTRAVENOUS | Status: AC
  Administered 2018-10-22: 10 mL via INTRAVENOUS
  Filled 2018-10-22: qty 10

## 2018-10-23 ENCOUNTER — Inpatient Hospital Stay: Payer: Medicare Other

## 2018-10-23 DIAGNOSIS — D4620 Refractory anemia with excess of blasts, unspecified: Secondary | ICD-10-CM

## 2018-10-23 DIAGNOSIS — C9141 Hairy cell leukemia, in remission: Secondary | ICD-10-CM | POA: Diagnosis not present

## 2018-10-23 LAB — KAPPA/LAMBDA LIGHT CHAINS
Kappa free light chain: 56.3 mg/L — ABNORMAL HIGH (ref 3.3–19.4)
Kappa, lambda light chain ratio: 5.26 — ABNORMAL HIGH (ref 0.26–1.65)
Lambda free light chains: 10.7 mg/L (ref 5.7–26.3)

## 2018-10-23 LAB — PREPARE RBC (CROSSMATCH)

## 2018-10-23 MED ORDER — ACETAMINOPHEN 325 MG PO TABS
650.0000 mg | ORAL_TABLET | Freq: Once | ORAL | Status: AC
  Administered 2018-10-23: 650 mg via ORAL
  Filled 2018-10-23: qty 2

## 2018-10-23 MED ORDER — SODIUM CHLORIDE 0.9% IV SOLUTION
250.0000 mL | Freq: Once | INTRAVENOUS | Status: AC
  Administered 2018-10-23: 250 mL via INTRAVENOUS
  Filled 2018-10-23: qty 250

## 2018-10-23 MED ORDER — SODIUM CHLORIDE 0.9% FLUSH
10.0000 mL | INTRAVENOUS | Status: AC | PRN
  Administered 2018-10-23: 10 mL
  Filled 2018-10-23: qty 10

## 2018-10-23 MED ORDER — HEPARIN SOD (PORK) LOCK FLUSH 100 UNIT/ML IV SOLN
500.0000 [IU] | Freq: Every day | INTRAVENOUS | Status: AC | PRN
  Administered 2018-10-23: 500 [IU]
  Filled 2018-10-23: qty 5

## 2018-10-23 NOTE — Progress Notes (Signed)
Hematology/Oncology Consult note [facility]  Telephone:([phone] [phone]  Patient Care Team: Ria Bush, MD as PCP - General (Family Medicine) Lequita Asal, MD as Referring Physician (Hematology and Oncology) Crissie Sickles, MD as Referring Physician (Hematology and Oncology) Crissie Sickles, MD as Referring Physician (Hematology and Oncology)   Name of the patient: Alex Smith  [id number]  1935/04/28   Date of visit: 10/23/18  Diagnosis- 1.  History of hairy cell leukemia 2.  Smoldering multiple myeloma under observation 3.  MDS on periodic transfusion  Chief complaint/ Reason for visit-routine follow-up of smoldering multiple myeloma and MDS  Heme/Onc history: Patient is a 84 year old male who sees Dr. [name] and is transitioning his care to me.  He has following issues:  1.  Hairy cell leukemia that was initially diagnosed on bone marrow biopsy in June 2016.  Bone marrow biopsy at that time showed extensive marrow involvement with recurrent hairy cell leukemia approximately 80% of the cells in the core 10% monoclonal plasma cell infiltrate compatible with plasma cell neoplasm.  Peripheral smear revealed leukopenia with a white count of 1900 compatible with hairy cell leukemia. 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  FISH studies for MDS were negative.  Cytogenetics were normal (46, XY).  He has last received cladribine for this back in July 2016  2.  Smoldering multiple myeloma: Bone marrow biopsy in March 2017 revealed persistent plasma cell neoplasm with monoclonal plasma cells estimated at 20 to 30%.  No morphologic evidence of residual hairy cell leukemia.Marrow was normocellular for age with relative erythroid hyperplasia, relative myeloid hypoplasia, mild dyspoiesis, adequate megakaryocytes and no increased blasts.  There was no significant increase in  marrow reticulin fibers.  Iron was present.  FISH studies for MDS were negative.  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  Cytogenetics were normal (46, XY).  Last bone marrow biopsy was in October 2018 which showed variably cellular marrow 10 to 50% with kappa restricted plasmacytosis of 10 to 15%.  No evidence of leukemia lymphoma or high-grade dysplasia.  Normal cytogenetics.  He has also been followed by Dr. [name] at [facility].  He has received Revlimid and Decadron in the past in 2017 which was subsequently stopped.  PET scan in July 2017 showed no hypermetabolic adenopathy or focal skeletal lesions.  3.  Probable MDS: He has received a trial of Procrit in the past in 2017 but apparently did not respond to it.  He has been getting periodic blood transfusions to maintain his hemoglobin closer to 9 as he is symptomatic when his hemoglobin is less than 9.  He has been getting about 2-3 blood transfusions during the year.  He does have some mild leukopenia/neutropenia and is on acyclovir prophylaxis.  He gets weekly G-CSF when his ANC is less than 1.5.  4.  Also has history of B12 deficiency for which he is on oral B12.   Interval history-he reports feeling fatigued but denies other complaints.  His appetite is good and he has not had any unintentional weight loss  ECOG PS- 1 Pain scale- 0   Review of systems- Review of Systems  Constitutional: Positive for malaise/fatigue. Negative for chills, fever and weight loss.  HENT: Negative for congestion, ear discharge and nosebleeds.   Eyes: Negative for blurred vision.  Respiratory: Negative for cough, hemoptysis, sputum production, shortness of breath and  wheezing.   Cardiovascular: Negative for chest pain, palpitations, orthopnea and claudication.  Gastrointestinal: Negative for abdominal pain, blood in stool, constipation, diarrhea, heartburn, melena, nausea and vomiting.  Genitourinary: Negative  for dysuria, flank pain, frequency, hematuria and urgency.  Musculoskeletal: Negative for back pain, joint pain and myalgias.  Skin: Negative for rash.  Neurological: Negative for dizziness, tingling, focal weakness, seizures, weakness and headaches.  Endo/Heme/Allergies: Does not bruise/bleed easily.  Psychiatric/Behavioral: Negative for depression and suicidal ideas. The patient does not have insomnia.       Allergies  Allergen Reactions  . Rituximab Rash    Chest tightness Chest tightness  . Blood-Group Specific Substance Other (See Comments)    Had a post transfusion reaction of red blood cells; NOW REQUIRES WASHED BLOOD CELLS Had a post transfusion reaction of red blood cells; NOW REQUIRES WASHED BLOOD CELLS  . Primaxin [Imipenem] Other (See Comments)    Possible allergy Possible allergy  . Voriconazole Other (See Comments)  . Sulfa Antibiotics Itching and Rash  . Sulfacetamide Sodium Itching and Rash     Past Medical History:  Diagnosis Date  . Anemia   . B12 deficiency   . Basal cell carcinoma of face   . BPH (benign prostatic hypertrophy)    followed by urology, discharged (Dr. Bernardo Heater)  . CAP (community acquired pneumonia) 02/15/2015  . CKD stage 3 due to type 2 diabetes mellitus ([place]) 11/14/2017  . Colon polyps   . Diverticulosis   . GERD (gastroesophageal reflux disease)   . Hairy cell leukemia ([place]) 2006   recurrent, seizure on rituxan, now on cladribine ([place])  . History of pneumonia 2000's   "once" (07/07/2012)  . History of shingles   . HLD (hyperlipidemia)   . Hypertension   . Pneumonia   . Shortness of breath dyspnea   . Systolic murmur 63/84/5364  . Type 2 diabetes, controlled, with retinopathy ([place])      Past Surgical History:  Procedure Laterality Date  . August VITRECTOMY WITH 20 GAUGE MVR PORT FOR MACULAR HOLE  07/07/2012   Procedure: 25 GAUGE PARS PLANA VITRECTOMY WITH 20 GAUGE MVR PORT FOR MACULAR HOLE;  Surgeon: Hayden Pedro, MD;  Location: [place];  Service: Ophthalmology;  Laterality: Left;  . BONE MARROW BIOPSY  2016  . CARDIOVASCULAR STRESS TEST  2013   treadmill - no evidence ischemia, EF 61%  . CATARACT EXTRACTION W/ INTRAOCULAR LENS  IMPLANT, BILATERAL  ~ 2010  . COLONOSCOPY  2014   [place] WNL no rpt needed, h/o polyps  . EYE SURGERY Left 06/2012   laser surgery  . GAS INSERTION  07/07/2012   Procedure: INSERTION OF GAS;  Surgeon: Hayden Pedro, MD;  Location: [place];  Service: Ophthalmology;  Laterality: Left;  C3F8  . PERIPHERAL VASCULAR CATHETERIZATION N/A 02/23/2015   Procedure: Glori Luis Cath Insertion;  Surgeon: Algernon Huxley, MD;  Location: [place] [facility];  Service: Cardiovascular;  Laterality: N/A;  . SERUM PATCH  07/07/2012   Procedure: SERUM PATCH;  Surgeon: Hayden Pedro, MD;  Location: [place];  Service: Ophthalmology;  Laterality: Left;  . SKIN CANCER EXCISION     "all over my face" (07/07/2012)    Social History   Socioeconomic History  . Marital status: Married    Spouse name: Not on file  . Number of children: Not on file  . Years of education: Not on file  . Highest education level: Not on file  Occupational History  .  Not on file  Social Needs  . Financial resource strain: Not on file  . Food insecurity:    Worry: Not on file    Inability: Not on file  . Transportation needs:    Medical: Not on file    Non-medical: Not on file  Tobacco Use  . Smoking status: Former Smoker    Packs/day: 2.00    Years: 25.00    Pack years: 50.00    Types: Cigarettes    Last attempt to quit: 02/06/1969    Years since quitting: 49.7  . Smokeless tobacco: Never Used  . Tobacco comment: 07/07/2012 "stopped smoking ~ 40 yr ago; smoked 20-70yr  Substance and Sexual Activity  . Alcohol use: Yes    Alcohol/week: 0.0 standard drinks    Comment: rare  . Drug use: No  . Sexual activity: Never  Lifestyle  . Physical activity:    Days per week: Not on file    Minutes per session:  Not on file  . Stress: Not on file  Relationships  . Social connections:    Talks on phone: Not on file    Gets together: Not on file    Attends religious service: Not on file    Active member of club or organization: Not on file    Attends meetings of clubs or organizations: Not on file    Relationship status: Not on file  . Intimate partner violence:    Fear of current or ex partner: Not on file    Emotionally abused: Not on file    Physically abused: Not on file    Forced sexual activity: Not on file  Other Topics Concern  . Not on file  Social History Narrative   Married >50 yrs, cares for wife. 1 cat   Occupation: was cArt gallery manager works part time for [employer]   Activity: likes to golf   Diet: moderate water, fruits/vegetables daily    Family History  Problem Relation Age of Onset  . Dementia Mother   . Heart failure Father 753 . Cancer Sister        breast  . Diabetes Paternal Uncle   . Diabetes Paternal Aunt   . CAD Brother 476      MI  . Stroke Neg Hx      Current Outpatient Medications:  .  acyclovir (ZOVIRAX) 400 MG tablet, TAKE 2 TABLETS BY MOUTH TWICE DAILY, Disp: 120 tablet, Rfl: 3 .  aspirin EC 81 MG tablet, Take 81 mg by mouth daily., Disp: , Rfl:  .  doxazosin (CARDURA) 1 MG tablet, TAKE ONE TABLET BY MOUTH EVERY DAY, Disp: 30 tablet, Rfl: 5 .  famotidine (PEPCID) 20 MG tablet, Take 1 tablet (20 mg total) by mouth at bedtime., Disp: 90 tablet, Rfl: 3 .  glucose blood (ACCU-CHEK AVIVA PLUS) test strip, USE TO CHECK SUGAR DAILY E11.319, Disp: 200 each, Rfl: 3 .  HUMULIN N 100 UNIT/ML injection, INJECT 39 UNITS SUBQ TWICE DAILY BEFORE A MEAL, Disp: 20 mL, Rfl: 6 .  hydrochlorothiazide (HYDRODIURIL) 25 MG tablet, TAKE 1 TABLET BY MOUTH DAILY, Disp: 30 tablet, Rfl: 6 .  insulin regular (HUMULIN R) 100 units/mL injection, Inject 0.1 mLs (10 Units total) into the skin 2 (two) times daily before a meal., Disp: 10 mL, Rfl: 5 .  lisinopril (PRINIVIL,ZESTRIL)  20 MG tablet, TAKE ONE (1) TABLET BY MOUTH TWO TIMES PER DAY, Disp: 60 tablet, Rfl: 5 .  metFORMIN (GLUCOPHAGE) 1000 MG tablet, TAKE  ONE TABLET TWICE DAILY, Disp: 180 tablet, Rfl: 3 .  MICROLET LANCETS MISC, USE AS DIRECTED to check sugars daily E11.319, Disp: 200 each, Rfl: 3 .  pravastatin (PRAVACHOL) 20 MG tablet, TAKE ONE TABLET BY MOUTH EVERY EVENING, Disp: 30 tablet, Rfl: 6 .  tamsulosin (FLOMAX) 0.4 MG CAPS capsule, TAKE 1 CAPSULE BY MOUTH EVERY DAY, Disp: 30 capsule, Rfl: 3 .  vitamin B-12 (CYANOCOBALAMIN) 1000 MCG tablet, Take 1 tablet (1,000 mcg total) by mouth daily., Disp: 90 tablet, Rfl: 2 .  vitamin E 400 UNIT capsule, Take 200 Units by mouth daily. , Disp: , Rfl:  .  lidocaine-prilocaine (EMLA) cream, Apply 1 application topically as needed. (Patient not taking: Reported on 10/22/2018), Disp: 30 g, Rfl: 3 .  traZODone (DESYREL) 50 MG tablet, TAKE 1/2 TO 1 TABLET BY MOUTH AT [facility] NEEDED FOR SLEEP (Patient not taking: Reported on 10/22/2018), Disp: 30 tablet, Rfl: 0  Current Facility-Administered Medications:  .  Tbo-Filgrastim (GRANIX) injection 480 mcg, 480 mcg, Subcutaneous, Once, Corcoran, Melissa C, MD  Facility-Administered Medications Ordered in Other Visits:  .  heparin lock flush 100 unit/mL, 500 Units, Intravenous, Once, Corcoran, Melissa C, MD .  sodium chloride 0.9 % injection 10 mL, 10 mL, Intravenous, PRN, [name], Melissa C, MD, 10 mL at 03/03/15 0903 .  sodium chloride 0.9 % injection 10 mL, 10 mL, Intracatheter, PRN, [name], Melissa C, MD, 10 mL at 03/10/15 1410 .  sodium chloride flush (NS) 0.9 % injection 10 mL, 10 mL, Intravenous, PRN, [name], Melissa C, MD, 10 mL at 07/23/18 1517  Physical exam:  Vitals:   10/22/18 1446  BP: (!) 146/66  Pulse: 76  Resp: 16  Temp: 98.1 F (36.7 C)  TempSrc: Tympanic  Weight: 223 lb (101.2 kg)   Physical Exam Constitutional:      General: He is not in acute distress. HENT:     Head: Normocephalic and atraumatic.   Eyes:     Pupils: Pupils are equal, round, and reactive to light.  Neck:     Musculoskeletal: Normal range of motion.  Cardiovascular:     Rate and Rhythm: Normal rate and regular rhythm.     Heart sounds: Normal heart sounds.  Pulmonary:     Effort: Pulmonary effort is normal.     Breath sounds: Normal breath sounds.  Abdominal:     General: Bowel sounds are normal.     Palpations: Abdomen is soft.  Skin:    General: Skin is warm and dry.  Neurological:     Mental Status: He is alert and oriented to person, place, and time.      CMP Latest Ref Rng & Units 10/22/2018  Glucose 70 - 99 mg/dL 305(H)  BUN 8 - 23 mg/dL 29(H)  Creatinine 0.61 - 1.24 mg/dL 1.58(H)  Sodium 135 - 145 mmol/L 136  Potassium 3.5 - 5.1 mmol/L 4.1  Chloride 98 - 111 mmol/L 105  CO2 22 - 32 mmol/L 24  Calcium 8.9 - 10.3 mg/dL 8.8(L)  Total Protein 6.5 - 8.1 g/dL 7.3  Total Bilirubin 0.3 - 1.2 mg/dL 0.5  Alkaline Phos 38 - 126 U/L 59  AST 15 - 41 U/L 22  ALT 0 - 44 U/L 21   CBC Latest Ref Rng & Units 10/22/2018  WBC 4.0 - 10.5 K/uL 3.1(L)  Hemoglobin 13.0 - 17.0 g/dL 8.7(L)  Hematocrit 39.0 - 52.0 % 24.9(L)  Platelets 150 - 400 K/uL 89(L)      Assessment and plan- Patient is  a 84 y.o. male with following issues  1.  Probable MDS: Prior bone marrow biopsies have not shown any overt evidence of dysplasia.  However patient does have pancytopenia with macrocytic anemia.  We have been treating him presumptively and MDS.  His white count is 3.1 today with an ANC of 2.0.  He therefore does not require any G-CSF at this time.  Platelet counts have been stable between 80s to 90s.  We will continue to monitor that.  Hemoglobin today is 8.7.  Patient feels fatigued and he has been co-managed by [facility] would recommend a goal to keep his hemoglobin close to 9.  I will therefore proceed with 1 unit of PRBC transfusion which he will get tomorrow.  He has been getting washed and irradiated PRBCs.  I will clarify with Dr.  [name] if this is needed because I do not anticipate the patient would be a bone marrow transplant candidate in the future.  2.  Smoldering multiple myeloma: Myeloma panel and serum free light chains from today are pending.  I will see him back in 3 months time with a CBC with differential, CMP, myeloma panel and serum free light chains for possible transfusion if hemoglobin is less than 9.  3.  Hairy cell leukemia: Patient remains asymptomatic.  Last bone marrow biopsy in 2018 did not show any evidence of recurrence.  Continue to monitor  4.  B12 deficiency: Continue oral B12  Visit Diagnosis 1. Myelodysplastic syndrome, low grade (HCC)   2. Hairy cell leukemia, in remission ([place])   3. Smoldering myeloma ([place])      Dr. Randa Evens, MD, MPH [facility] [phone] 10/23/2018 11:09 AM

## 2018-10-24 LAB — MULTIPLE MYELOMA PANEL, SERUM
Albumin SerPl Elph-Mcnc: 3.8 g/dL (ref 2.9–4.4)
Albumin/Glob SerPl: 1.4 (ref 0.7–1.7)
Alpha 1: 0.2 g/dL (ref 0.0–0.4)
Alpha2 Glob SerPl Elph-Mcnc: 0.7 g/dL (ref 0.4–1.0)
B-Globulin SerPl Elph-Mcnc: 0.8 g/dL (ref 0.7–1.3)
Gamma Glob SerPl Elph-Mcnc: 1.3 g/dL (ref 0.4–1.8)
Globulin, Total: 2.9 g/dL (ref 2.2–3.9)
IgA: 17 mg/dL — ABNORMAL LOW (ref 61–437)
IgG (Immunoglobin G), Serum: 1592 mg/dL (ref 700–1600)
IgM (Immunoglobulin M), Srm: 24 mg/dL (ref 15–143)
M Protein SerPl Elph-Mcnc: 1.2 g/dL — ABNORMAL HIGH
Total Protein ELP: 6.7 g/dL (ref 6.0–8.5)

## 2018-10-24 LAB — BPAM RBC
Blood Product Expiration Date: 202003070724
ISSUE DATE / TIME: 202003061154
Unit Type and Rh: 600

## 2018-10-24 LAB — TYPE AND SCREEN
ABO/RH(D): A NEG
Antibody Screen: NEGATIVE
Unit division: 0

## 2018-10-29 ENCOUNTER — Telehealth: Payer: Self-pay

## 2018-10-29 NOTE — Telephone Encounter (Signed)
[facility] was reviewing pts med list with pt and pt is not sure since on insulin if pt should be taking metformin 1000 mg twice a day and pt wants to get 90 day supply of lisinopril with refills.Please advise.

## 2018-10-30 MED ORDER — LISINOPRIL 20 MG PO TABS: ORAL_TABLET | ORAL | 1 refills | Status: DC

## 2018-10-30 NOTE — Telephone Encounter (Signed)
Spoke with Arbie Cookey answering questions about pt's meds.  Also notified her a 90-day refill for lisinopril. Verbalizes understanding. Says she will inform pt.

## 2018-12-07 ENCOUNTER — Other Ambulatory Visit: Payer: Self-pay | Admitting: Family Medicine

## 2018-12-08 ENCOUNTER — Encounter: Admit: 2018-12-08 | Discharge: 2018-12-09 | Payer: MEDICARE | Attending: Internal Medicine | Primary: Internal Medicine

## 2018-12-08 DIAGNOSIS — C9141 Hairy cell leukemia, in remission: Secondary | ICD-10-CM

## 2018-12-08 DIAGNOSIS — C9000 Multiple myeloma not having achieved remission: Secondary | ICD-10-CM

## 2018-12-08 DIAGNOSIS — D4620 Refractory anemia with excess of blasts, unspecified: Principal | ICD-10-CM

## 2019-01-05 ENCOUNTER — Other Ambulatory Visit: Payer: Self-pay

## 2019-01-05 ENCOUNTER — Inpatient Hospital Stay: Payer: Medicare Other | Attending: Oncology

## 2019-01-05 ENCOUNTER — Inpatient Hospital Stay: Payer: Medicare Other

## 2019-01-05 DIAGNOSIS — C9141 Hairy cell leukemia, in remission: Secondary | ICD-10-CM | POA: Diagnosis not present

## 2019-01-05 DIAGNOSIS — Z452 Encounter for adjustment and management of vascular access device: Secondary | ICD-10-CM | POA: Insufficient documentation

## 2019-01-05 DIAGNOSIS — Z95828 Presence of other vascular implants and grafts: Secondary | ICD-10-CM

## 2019-01-05 MED ORDER — SODIUM CHLORIDE 0.9% FLUSH
10.0000 mL | Freq: Once | INTRAVENOUS | Status: AC
  Administered 2019-01-05: 10 mL via INTRAVENOUS
  Filled 2019-01-05: qty 10

## 2019-01-05 MED ORDER — HEPARIN SOD (PORK) LOCK FLUSH 100 UNIT/ML IV SOLN
500.0000 [IU] | Freq: Once | INTRAVENOUS | Status: AC
  Administered 2019-01-05: 500 [IU] via INTRAVENOUS

## 2019-01-06 ENCOUNTER — Ambulatory Visit
Admission: RE | Admit: 2019-01-06 | Discharge: 2019-01-06 | Disposition: A | Discharge status: Home / Self Care | Payer: Medicare Other | Source: Ambulatory Visit | Attending: Radiation Oncology | Admitting: Radiation Oncology

## 2019-01-06 ENCOUNTER — Other Ambulatory Visit: Payer: Self-pay

## 2019-01-06 DIAGNOSIS — Z87891 Personal history of nicotine dependence: Secondary | ICD-10-CM | POA: Diagnosis not present

## 2019-01-06 DIAGNOSIS — Z8522 Personal history of malignant neoplasm of nasal cavities, middle ear, and accessory sinuses: Secondary | ICD-10-CM | POA: Diagnosis not present

## 2019-01-06 DIAGNOSIS — C44301 Unspecified malignant neoplasm of skin of nose: Secondary | ICD-10-CM

## 2019-01-06 NOTE — Progress Notes (Signed)
Radiation Oncology Follow up Note  Name: Alex Smith   Date:   01/06/2019 MRN:  [id number] DOB: 08/02/1935    This 84 y.o. male presents to the clinic 24-monthfollow-up status post electron-beam radiation therapy for basal cell carcinoma of his nose REFERRING PROVIDER: GRia Bush MD  HPI: Patient is an 84year old male now at 7 months having completed electron-beam radiation therapy to his nose for basal cell carcinoma.  Seen today in routine follow-up he is doing well he has no evidence of disease.  He is without complaints.  He is currently under medical oncology's care for smoldering multiple myeloma and myelodysplastic syndrome..  COMPLICATIONS OF TREATMENT: none  FOLLOW UP COMPLIANCE: keeps appointments   PHYSICAL EXAM:  There were no vitals taken for this visit. Nose is clear without evidence of residual mass or nodularity no evidence of sub-digastric cervical or supraclavicular adenopathy is appreciated.  Well-developed well-nourished patient in NAD. HEENT reveals PERLA, EOMI, discs not visualized.  Oral cavity is clear. No oral mucosal lesions are identified. Neck is clear without evidence of cervical or supraclavicular adenopathy. Lungs are clear to A&P. Cardiac examination is essentially unremarkable with regular rate and rhythm without murmur rub or thrill. Abdomen is benign with no organomegaly or masses noted. Motor sensory and DTR levels are equal and symmetric in the upper and lower extremities. Cranial nerves II through XII are grossly intact. Proprioception is intact. No peripheral adenopathy or edema is identified. No motor or sensory levels are noted. Crude visual fields are within normal range.  RADIOLOGY RESULTS: No current films for review  PLAN: Present time is had a complete response.  I will turn follow-up care over to Dr. KNehemiah Masseddermatology.  I would be happy to reevaluate the patient anytime should any further lesions be discovered.  Continues close  follow-up care with medical oncology for above and stated hematologic problems.  Patient knows to call with any concerns.  I would like to take this opportunity to thank you for allowing me to participate in the care of your patient..Noreene Filbert MD

## 2019-01-14 DIAGNOSIS — Z85828 Personal history of other malignant neoplasm of skin: Secondary | ICD-10-CM | POA: Diagnosis not present

## 2019-01-14 DIAGNOSIS — L821 Other seborrheic keratosis: Secondary | ICD-10-CM | POA: Diagnosis not present

## 2019-01-14 DIAGNOSIS — Z1283 Encounter for screening for malignant neoplasm of skin: Secondary | ICD-10-CM | POA: Diagnosis not present

## 2019-01-14 DIAGNOSIS — L578 Other skin changes due to chronic exposure to nonionizing radiation: Secondary | ICD-10-CM | POA: Diagnosis not present

## 2019-01-14 DIAGNOSIS — L570 Actinic keratosis: Secondary | ICD-10-CM | POA: Diagnosis not present

## 2019-01-25 ENCOUNTER — Other Ambulatory Visit: Payer: Self-pay | Admitting: Family Medicine

## 2019-01-25 NOTE — Telephone Encounter (Signed)
Last office visit 08/28/2018 for DM.  Last refilled 05/08/2018 for #120 with 3 refills.  CPE scheduled for 03/09/2019.

## 2019-02-23 ENCOUNTER — Other Ambulatory Visit: Payer: Self-pay | Admitting: Family Medicine

## 2019-03-02 ENCOUNTER — Other Ambulatory Visit: Payer: Self-pay

## 2019-03-02 ENCOUNTER — Ambulatory Visit: Payer: Medicare Other

## 2019-03-02 ENCOUNTER — Telehealth: Payer: Self-pay | Admitting: Oncology

## 2019-03-02 ENCOUNTER — Other Ambulatory Visit: Payer: Self-pay | Admitting: Family Medicine

## 2019-03-02 ENCOUNTER — Other Ambulatory Visit (INDEPENDENT_AMBULATORY_CARE_PROVIDER_SITE_OTHER): Payer: Medicare Other

## 2019-03-02 DIAGNOSIS — E785 Hyperlipidemia, unspecified: Secondary | ICD-10-CM | POA: Diagnosis not present

## 2019-03-02 DIAGNOSIS — E1122 Type 2 diabetes mellitus with diabetic chronic kidney disease: Secondary | ICD-10-CM

## 2019-03-02 DIAGNOSIS — T451X5A Adverse effect of antineoplastic and immunosuppressive drugs, initial encounter: Secondary | ICD-10-CM

## 2019-03-02 DIAGNOSIS — D539 Nutritional anemia, unspecified: Secondary | ICD-10-CM | POA: Diagnosis not present

## 2019-03-02 DIAGNOSIS — E11319 Type 2 diabetes mellitus with unspecified diabetic retinopathy without macular edema: Secondary | ICD-10-CM | POA: Diagnosis not present

## 2019-03-02 DIAGNOSIS — N183 Chronic kidney disease, stage 3 unspecified: Secondary | ICD-10-CM

## 2019-03-02 DIAGNOSIS — Z794 Long term (current) use of insulin: Secondary | ICD-10-CM | POA: Diagnosis not present

## 2019-03-02 DIAGNOSIS — D61810 Antineoplastic chemotherapy induced pancytopenia: Secondary | ICD-10-CM

## 2019-03-02 DIAGNOSIS — E538 Deficiency of other specified B group vitamins: Secondary | ICD-10-CM

## 2019-03-02 DIAGNOSIS — D472 Monoclonal gammopathy: Secondary | ICD-10-CM

## 2019-03-02 DIAGNOSIS — C9000 Multiple myeloma not having achieved remission: Secondary | ICD-10-CM

## 2019-03-02 LAB — CBC WITH DIFFERENTIAL/PLATELET
Basophils Absolute: 0 10*3/uL (ref 0.0–0.1)
Basophils Relative: 0.6 % (ref 0.0–3.0)
Eosinophils Absolute: 0.1 10*3/uL (ref 0.0–0.7)
Eosinophils Relative: 3.2 % (ref 0.0–5.0)
HCT: 26.3 % — ABNORMAL LOW (ref 39.0–52.0)
Hemoglobin: 8.9 g/dL — ABNORMAL LOW (ref 13.0–17.0)
Lymphocytes Relative: 23.7 % (ref 12.0–46.0)
Lymphs Abs: 0.7 10*3/uL (ref 0.7–4.0)
MCHC: 34 g/dL (ref 30.0–36.0)
MCV: 113.1 fl — ABNORMAL HIGH (ref 78.0–100.0)
Monocytes Absolute: 0.2 10*3/uL (ref 0.1–1.0)
Monocytes Relative: 6.7 % (ref 3.0–12.0)
Neutro Abs: 1.9 10*3/uL (ref 1.4–7.7)
Neutrophils Relative %: 65.8 % (ref 43.0–77.0)
Platelets: 112 10*3/uL — ABNORMAL LOW (ref 150.0–400.0)
RBC: 2.32 Mil/uL — ABNORMAL LOW (ref 4.22–5.81)
RDW: 15 % (ref 11.5–15.5)
WBC: 2.9 10*3/uL — ABNORMAL LOW (ref 4.0–10.5)

## 2019-03-02 LAB — COMPREHENSIVE METABOLIC PANEL
ALT: 17 U/L (ref 0–53)
AST: 18 U/L (ref 0–37)
Albumin: 4 g/dL (ref 3.5–5.2)
Alkaline Phosphatase: 53 U/L (ref 39–117)
BUN: 27 mg/dL — ABNORMAL HIGH (ref 6–23)
CO2: 27 mEq/L (ref 19–32)
Calcium: 9 mg/dL (ref 8.4–10.5)
Chloride: 106 mEq/L (ref 96–112)
Creatinine, Ser: 1.49 mg/dL (ref 0.40–1.50)
GFR: 45.11 mL/min — ABNORMAL LOW (ref >60.00)
Glucose, Bld: 241 mg/dL — ABNORMAL HIGH (ref 70–99)
Potassium: 4.9 mEq/L (ref 3.5–5.1)
Sodium: 138 mEq/L (ref 135–145)
Total Bilirubin: 0.6 mg/dL (ref 0.2–1.2)
Total Protein: 6.7 g/dL (ref 6.0–8.3)

## 2019-03-02 LAB — HEMOGLOBIN A1C: Hgb A1c MFr Bld: 6 % (ref 4.6–6.5)

## 2019-03-02 LAB — VITAMIN D 25 HYDROXY (VIT D DEFICIENCY, FRACTURES): VITD: 26.41 ng/mL — ABNORMAL LOW (ref 30.00–100.00)

## 2019-03-02 LAB — LIPID PANEL
Cholesterol: 135 mg/dL (ref 0–200)
HDL: 34.2 mg/dL — ABNORMAL LOW (ref >39.00)
NonHDL: 100.34
Total CHOL/HDL Ratio: 4
Triglycerides: 253 mg/dL — ABNORMAL HIGH (ref 0.0–149.0)
VLDL: 50.6 mg/dL — ABNORMAL HIGH (ref 0.0–40.0)

## 2019-03-02 LAB — LDL CHOLESTEROL, DIRECT: Direct LDL: 68 mg/dL

## 2019-03-02 LAB — VITAMIN B12: Vitamin B-12: 669 pg/mL (ref 211–911)

## 2019-03-02 NOTE — Telephone Encounter (Signed)
Spoke with pt to confirm appt date/time, do pre-appt screen which was completed, and adv of Covid-19 guidelines for appt regarding screening questions, temperature check, face mask required, and no visitors allowed °

## 2019-03-03 ENCOUNTER — Inpatient Hospital Stay: Payer: Medicare Other | Attending: Oncology

## 2019-03-03 ENCOUNTER — Other Ambulatory Visit: Payer: Self-pay

## 2019-03-03 DIAGNOSIS — Z452 Encounter for adjustment and management of vascular access device: Secondary | ICD-10-CM | POA: Insufficient documentation

## 2019-03-03 DIAGNOSIS — C9141 Hairy cell leukemia, in remission: Secondary | ICD-10-CM | POA: Insufficient documentation

## 2019-03-03 DIAGNOSIS — Z95828 Presence of other vascular implants and grafts: Secondary | ICD-10-CM

## 2019-03-03 MED ORDER — SODIUM CHLORIDE 0.9% FLUSH
10.0000 mL | Freq: Once | INTRAVENOUS | Status: AC
  Administered 2019-03-03: 10 mL via INTRAVENOUS
  Filled 2019-03-03: qty 10

## 2019-03-03 MED ORDER — HEPARIN SOD (PORK) LOCK FLUSH 100 UNIT/ML IV SOLN
500.0000 [IU] | Freq: Once | INTRAVENOUS | Status: AC
  Administered 2019-03-03: 500 [IU] via INTRAVENOUS

## 2019-03-09 ENCOUNTER — Ambulatory Visit (INDEPENDENT_AMBULATORY_CARE_PROVIDER_SITE_OTHER): Payer: Medicare Other | Admitting: Family Medicine

## 2019-03-09 ENCOUNTER — Other Ambulatory Visit: Payer: Self-pay

## 2019-03-09 ENCOUNTER — Encounter: Payer: Self-pay | Admitting: Family Medicine

## 2019-03-09 VITALS — BP 142/70 | HR 76 | Temp 98.4°F | Ht 66.25 in | Wt 220.3 lb

## 2019-03-09 DIAGNOSIS — D539 Nutritional anemia, unspecified: Secondary | ICD-10-CM

## 2019-03-09 DIAGNOSIS — D472 Monoclonal gammopathy: Secondary | ICD-10-CM

## 2019-03-09 DIAGNOSIS — D61810 Antineoplastic chemotherapy induced pancytopenia: Secondary | ICD-10-CM

## 2019-03-09 DIAGNOSIS — Z0000 Encounter for general adult medical examination without abnormal findings: Secondary | ICD-10-CM | POA: Diagnosis not present

## 2019-03-09 DIAGNOSIS — E11319 Type 2 diabetes mellitus with unspecified diabetic retinopathy without macular edema: Secondary | ICD-10-CM

## 2019-03-09 DIAGNOSIS — E785 Hyperlipidemia, unspecified: Secondary | ICD-10-CM

## 2019-03-09 DIAGNOSIS — R4589 Other symptoms and signs involving emotional state: Secondary | ICD-10-CM

## 2019-03-09 DIAGNOSIS — T451X5A Adverse effect of antineoplastic and immunosuppressive drugs, initial encounter: Secondary | ICD-10-CM

## 2019-03-09 DIAGNOSIS — D46Z Other myelodysplastic syndromes: Secondary | ICD-10-CM

## 2019-03-09 DIAGNOSIS — E1122 Type 2 diabetes mellitus with diabetic chronic kidney disease: Secondary | ICD-10-CM

## 2019-03-09 DIAGNOSIS — Z7189 Other specified counseling: Secondary | ICD-10-CM

## 2019-03-09 DIAGNOSIS — G4700 Insomnia, unspecified: Secondary | ICD-10-CM

## 2019-03-09 DIAGNOSIS — N138 Other obstructive and reflux uropathy: Secondary | ICD-10-CM

## 2019-03-09 DIAGNOSIS — C9000 Multiple myeloma not having achieved remission: Secondary | ICD-10-CM

## 2019-03-09 DIAGNOSIS — E559 Vitamin D deficiency, unspecified: Secondary | ICD-10-CM

## 2019-03-09 DIAGNOSIS — I10 Essential (primary) hypertension: Secondary | ICD-10-CM

## 2019-03-09 DIAGNOSIS — R32 Unspecified urinary incontinence: Secondary | ICD-10-CM

## 2019-03-09 DIAGNOSIS — E538 Deficiency of other specified B group vitamins: Secondary | ICD-10-CM

## 2019-03-09 DIAGNOSIS — C9141 Hairy cell leukemia, in remission: Secondary | ICD-10-CM

## 2019-03-09 NOTE — Assessment & Plan Note (Signed)
Advanced directives: discussed, has not set up, packet provided last visit and again today. Would want wife then son to be HCPOA. Full code, ok for temporary measures but doesn't want prolonged life support. Is organ donor.

## 2019-03-09 NOTE — Progress Notes (Signed)
This visit was conducted in person.  BP (!) 142/70 (BP Location: Left Arm, Patient Position: Sitting, Cuff Size: Normal)   Pulse 76   Temp 98.4 F (36.9 C) (Temporal)   Ht 5' 6.25" (1.683 m)   Wt 220 lb 4.8 oz (99.9 kg)   SpO2 97%   BMI 35.29 kg/m    CC: AMW  Subjective:    Patient ID: Alex Smith, male    DOB: 1934-10-09, 84 y.o.   MRN: [id number]  HPI: Alex Smith is a 84 y.o. male presenting on 03/09/2019 for Medicare Wellness (no new concerns)   Did not see health advisor this year.  Lives alone. Wife lives in [place] senior [facility]. Has seen wife only once this year - for her 80th birthday. Struggles some with social isolation due to [phone].   Known myelodysplasia with smoldering myeloma s/p treatment for hairy cell leukemia which is in remission followed by Dr [name]. Upcoming onc appt 04/22/2019. Chronic fatigue, low energy.   Has been followed by Dr Baruch Gouty for nasal [facility] s/p 25 XRT, released back to dermatology Nehemiah Massed).   Preventative: COLONOSCOPY 2014 - Elliot WNL no rpt needed, h/o polyps  Prostate cancer screening - aged out. Released from Dr. Dene Gentry care for BPH as he was doing well. Denies urinary issues.  Flu shot -yearly Pneumovax 2006, prevnar2016. Td 1997  Zzostavax - declines. Currently on acyclovir. Would want to check with onc re: this. Advanced directives: discussed, has not set up, packet provided last visit and again today. Would want wife then son to be HCPOA. Full code, ok for temporary measures but doesn't want prolonged life support. Is organ donor.  Seat belt use discussed Sunscreen use discussed. No changing moles on skin. Just had BCC treated on nose.  Ex smoker - quit 1970. Alcohol - seldom Dentist Q6 mo Eye exam yearly Bowel - no constipation Bladder - mild leaking noted over last 3-4 yrs, nocturia x1.   Married >50 yrs, cares for wife - who lives in nursing home. 1 cat  Occupation: was Art gallery manager, Personal assistant [employer]  Activity: likes to golf  Diet: moderate water, fruits/vegetables daily     Relevant past medical, surgical, family and social history reviewed and updated as indicated. Interim medical history since our last visit reviewed. Allergies and medications reviewed and updated. Outpatient Medications Prior to Visit  Medication Sig Dispense Refill  . acyclovir (ZOVIRAX) 400 MG tablet TAKE TWO TABLETS TWICE DAILY 120 tablet 3  . aspirin EC 81 MG tablet Take 81 mg by mouth daily.    Marland Kitchen doxazosin (CARDURA) 1 MG tablet TAKE ONE TABLET BY MOUTH EVERY DAY 30 tablet 5  . famotidine (PEPCID) 20 MG tablet Take 1 tablet (20 mg total) by mouth at bedtime. 90 tablet 3  . glucose blood (ACCU-CHEK AVIVA PLUS) test strip USE TO CHECK SUGAR DAILY E11.319 200 each 3  . HUMULIN N 100 UNIT/ML injection INJECT 39 UNITS SUBCUTANEOUSLY TWICE DAILY BEFORE A MEAL 20 mL 0  . hydrochlorothiazide (HYDRODIURIL) 25 MG tablet TAKE 1 TABLET BY MOUTH DAILY 30 tablet 6  . insulin regular (HUMULIN R) 100 units/mL injection Inject 0.1 mLs (10 Units total) into the skin 2 (two) times daily before a meal. 10 mL 5  . lidocaine-prilocaine (EMLA) cream Apply 1 application topically as needed. 30 g 3  . lisinopril (PRINIVIL,ZESTRIL) 20 MG tablet TAKE ONE (1) TABLET BY MOUTH TWO TIMES PER DAY 90 tablet 1  . metFORMIN (GLUCOPHAGE) 1000  MG tablet TAKE ONE (1) TABLET BY MOUTH TWO TIMES PER DAY 180 tablet 3  . MICROLET LANCETS MISC USE AS DIRECTED to check sugars daily E11.319 200 each 3  . pravastatin (PRAVACHOL) 20 MG tablet TAKE ONE TABLET BY MOUTH EVERY EVENING 30 tablet 6  . tamsulosin (FLOMAX) 0.4 MG CAPS capsule TAKE 1 CAPSULE BY MOUTH EVERY DAY 30 capsule 3  . traZODone (DESYREL) 50 MG tablet TAKE 1/2 TO 1 TABLET BY MOUTH AT BEDTIMEAS NEEDED FOR SLEEP 30 tablet 0  . vitamin B-12 (CYANOCOBALAMIN) 1000 MCG tablet Take 1 tablet (1,000 mcg total) by mouth daily. 90 tablet 2  . vitamin E 400 UNIT capsule Take 200 Units  by mouth daily.      Facility-Administered Medications Prior to Visit  Medication Dose Route Frequency Provider Last Rate Last Dose  . heparin lock flush 100 unit/mL  500 Units Intravenous Once Corcoran, Melissa C, MD      . sodium chloride 0.9 % injection 10 mL  10 mL Intravenous PRN Lequita Asal, MD   10 mL at 03/03/15 0903  . sodium chloride 0.9 % injection 10 mL  10 mL Intracatheter PRN Nolon Stalls C, MD   10 mL at 03/10/15 1410  . sodium chloride flush (NS) 0.9 % injection 10 mL  10 mL Intravenous PRN Lequita Asal, MD   10 mL at 07/23/18 1517  . Tbo-Filgrastim (GRANIX) injection 480 mcg  480 mcg Subcutaneous Once Lequita Asal, MD         Per HPI unless specifically indicated in ROS section below Review of Systems  Constitutional: Negative for activity change, appetite change, chills, fatigue, fever and unexpected weight change.  HENT: Negative for hearing loss.   Eyes: Negative for visual disturbance.  Respiratory: Positive for shortness of breath (exertional). Negative for cough, chest tightness and wheezing.   Cardiovascular: Negative for chest pain, palpitations and leg swelling.  Gastrointestinal: Negative for abdominal distention, abdominal pain, blood in stool, constipation, diarrhea, nausea and vomiting.  Genitourinary: Negative for difficulty urinating and hematuria.  Musculoskeletal: Negative for arthralgias, myalgias and neck pain.  Skin: Negative for rash.  Neurological: Negative for dizziness, seizures, syncope and headaches.  Hematological: Negative for adenopathy. Bruises/bleeds easily.  Psychiatric/Behavioral: Positive for dysphoric mood. The patient is not nervous/anxious.    Objective:    BP (!) 142/70 (BP Location: Left Arm, Patient Position: Sitting, Cuff Size: Normal)   Pulse 76   Temp 98.4 F (36.9 C) (Temporal)   Ht 5' 6.25" (1.683 m)   Wt 220 lb 4.8 oz (99.9 kg)   SpO2 97%   BMI 35.29 kg/m   Wt Readings from Last 3  Encounters:  03/09/19 220 lb 4.8 oz (99.9 kg)  10/22/18 223 lb (101.2 kg)  08/28/18 221 lb 4 oz (100.4 kg)    Physical Exam Vitals signs and nursing note reviewed.  Constitutional:      General: He is not in acute distress.    Appearance: Normal appearance. He is well-developed.  HENT:     Head: Normocephalic and atraumatic.     Right Ear: Hearing, tympanic membrane, ear canal and external ear normal.     Left Ear: Hearing, ear canal and external ear normal.     Nose: Nose normal.     Mouth/Throat:     Mouth: Mucous membranes are moist.     Pharynx: Uvula midline. No oropharyngeal exudate or posterior oropharyngeal erythema.  Eyes:     General: No scleral icterus.  Conjunctiva/sclera: Conjunctivae normal.     Pupils: Pupils are equal, round, and reactive to light.  Neck:     Musculoskeletal: Normal range of motion and neck supple.  Cardiovascular:     Rate and Rhythm: Normal rate and regular rhythm.     Pulses: Normal pulses.          Radial pulses are 2+ on the right side and 2+ on the left side.     Heart sounds: Normal heart sounds. No murmur.  Pulmonary:     Effort: Pulmonary effort is normal. No respiratory distress.     Breath sounds: Normal breath sounds. No wheezing, rhonchi or rales.  Abdominal:     General: Bowel sounds are normal. There is no distension.     Palpations: Abdomen is soft. There is no mass.     Tenderness: There is no abdominal tenderness. There is no guarding or rebound.  Musculoskeletal: Normal range of motion.  Lymphadenopathy:     Cervical: No cervical adenopathy.  Skin:    General: Skin is warm and dry.     Findings: No rash.  Neurological:     General: No focal deficit present.     Mental Status: He is alert and oriented to person, place, and time.     Comments:  CN grossly intact, station and gait intact Recall 2/3, 3/3 with cue [id number]  Psychiatric:        Mood and Affect: Mood normal.        Behavior: Behavior normal.         Thought Content: Thought content normal.        Judgment: Judgment normal.       Results for orders placed or performed in visit on 03/02/19  VITAMIN D 25 Hydroxy (Vit-D Deficiency, Fractures)  Result Value Ref Range   VITD 26.41 (L) 30.00 - 100.00 ng/mL  Vitamin B12  Result Value Ref Range   Vitamin B-12 669 211 - 911 pg/mL  CBC with Differential/Platelet  Result Value Ref Range   WBC 2.9 (L) 4.0 - 10.5 K/uL   RBC 2.32 (L) 4.22 - 5.81 Mil/uL   Hemoglobin 8.9 Repeated and verified X2. (L) 13.0 - 17.0 g/dL   HCT 26.3 Repeated and verified X2. (L) 39.0 - 52.0 %   MCV 113.1 Repeated and verified X2. (H) 78.0 - 100.0 fl   MCHC 34.0 30.0 - 36.0 g/dL   RDW 15.0 11.5 - 15.5 %   Platelets 112.0 (L) 150.0 - 400.0 K/uL   Neutrophils Relative % 65.8 43.0 - 77.0 %   Lymphocytes Relative 23.7 12.0 - 46.0 %   Monocytes Relative 6.7 3.0 - 12.0 %   Eosinophils Relative 3.2 0.0 - 5.0 %   Basophils Relative 0.6 0.0 - 3.0 %   Neutro Abs 1.9 1.4 - 7.7 K/uL   Lymphs Abs 0.7 0.7 - 4.0 K/uL   Monocytes Absolute 0.2 0.1 - 1.0 K/uL   Eosinophils Absolute 0.1 0.0 - 0.7 K/uL   Basophils Absolute 0.0 0.0 - 0.1 K/uL  Hemoglobin A1c  Result Value Ref Range   Hgb A1c MFr Bld 6.0 4.6 - 6.5 %  Comprehensive metabolic panel  Result Value Ref Range   Sodium 138 135 - 145 mEq/L   Potassium 4.9 3.5 - 5.1 mEq/L   Chloride 106 96 - 112 mEq/L   CO2 27 19 - 32 mEq/L   Glucose, Bld 241 (H) 70 - 99 mg/dL   BUN 27 (H) 6 - 23 mg/dL  Creatinine, Ser 1.49 0.40 - 1.50 mg/dL   Total Bilirubin 0.6 0.2 - 1.2 mg/dL   Alkaline Phosphatase 53 39 - 117 U/L   AST 18 0 - 37 U/L   ALT 17 0 - 53 U/L   Total Protein 6.7 6.0 - 8.3 g/dL   Albumin 4.0 3.5 - 5.2 g/dL   Calcium 9.0 8.4 - 10.5 mg/dL   GFR 45.11 (L) >60.00 mL/min  Lipid panel  Result Value Ref Range   Cholesterol 135 0 - 200 mg/dL   Triglycerides 253.0 (H) 0.0 - 149.0 mg/dL   HDL 34.20 (L) >39.00 mg/dL   VLDL 50.6 (H) 0.0 - 40.0 mg/dL   Total CHOL/HDL Ratio  4    NonHDL 100.34   LDL cholesterol, direct  Result Value Ref Range   Direct LDL 68.0 mg/dL   Depression screen [facility] 2/9 03/09/2019 02/16/2018 12/13/2016 12/11/2016 05/24/2016  Decreased Interest 0 0 1 0 0  Down, Depressed, Hopeless 2 0 1 0 0  PHQ - 2 Score 2 0 2 0 0  Altered sleeping 0 0 2 - -  Tired, decreased energy 2 0 0 - -  Change in appetite 0 0 1 - -  Feeling bad or failure about yourself  2 0 1 - -  Trouble concentrating 2 0 1 - -  Moving slowly or fidgety/restless 0 0 0 - -  Suicidal thoughts 1 0 0 - -  PHQ-9 Score 9 0 7 - -  Difficult doing work/chores - Not difficult at all - - -  Some recent data might be hidden    Assessment & Plan:   Problem List Items Addressed This Visit    Vitamin D deficiency    rec start 1000 IU daily.       Urinary incontinence    Denies significant trouble with this.       Type 2 diabetes, controlled, with retinopathy ([place])    A1c always normal - anticipate falsely low due to chronic anemia give cbg's run 200s.  Patient needs fructosamine as well.       Smoldering myeloma (HCC)   Severe obesity (BMI 35.0-39.9) with comorbidity ([place])    Continue to encourage healthy diet and lifestyle       Pancytopenia due to antineoplastic chemotherapy ([place])   Myelodysplasia present in bone marrow (HCC)   Monoclonal gammopathy   Medicare annual wellness visit, subsequent - Primary    I have personally reviewed the Medicare Annual Wellness questionnaire and have noted 1. The patient's medical and social history 2. Their use of alcohol, tobacco or illicit drugs 3. Their current medications and supplements 4. The patient's functional ability including ADL's, fall risks, home safety risks and hearing or visual impairment. Cognitive function has been assessed and addressed as indicated.  5. Diet and physical activity 6. Evidence for depression or mood disorders The patients weight, height, BMI have been recorded in the chart. I have made referrals,  counseling and provided education to the patient based on review of the above and I have provided the pt with a written personalized care plan for preventive services. Provider list updated.. See scanned questionairre as needed for further documentation. Reviewed preventative protocols and updated unless pt declined.       Macrocytic anemia   Insomnia    Continue trazodone.       Health maintenance examination    Preventative protocols reviewed and updated unless pt declined. Discussed healthy diet and lifestyle.  Hairy cell leukemia, in remission ([place])   Essential hypertension    Chronic, stable. Continue current regimen.       Dyslipidemia    Chronic. LDL at goal, triglycerides remain elevated. Continue pravastatin.  The ASCVD Risk score Mikey Bussing DC Jr., et al., 2013) failed to calculate for the following reasons:   The 2013 ASCVD risk score is only valid for ages 35 to 3       Depressed mood    Support provided. Encouraged social engagement. Declines medication at this time.       CKD stage 3 due to type 2 diabetes mellitus ([place])    Reviewed with patient. Again encouraged tylenol PM in place of aleve PM. Encouraged good hydration status.       BPH with obstruction/lower urinary tract symptoms    On both flomax and cardura - will review at next appt.       B12 deficiency    Continue b12 supplementation.       Advanced care planning/counseling discussion    Advanced directives: discussed, has not set up, packet provided last visit and again today. Would want wife then son to be HCPOA. Full code, ok for temporary measures but doesn't want prolonged life support. Is organ donor.           No orders of the defined types were placed in this encounter.  No orders of the defined types were placed in this encounter.  Patient instructions: Advanced directive packet provided today Start 1000 units vit D3 daily.  Change aleve PM to tylenol PM due to chronic kidney  disease.  Return in 6 months for diabetes follow up visit with sugar log 1 week prior.   Follow up plan: Return in about 6 months (around 09/09/2019) for follow up visit.  Ria Bush, MD

## 2019-03-09 NOTE — Assessment & Plan Note (Signed)

## 2019-03-09 NOTE — Patient Instructions (Addendum)
Advanced directive packet provided today Start 1000 units vit D3 daily.  Change aleve PM to tylenol PM due to chronic kidney disease.  Return in 6 months for diabetes follow up visit with sugar log 1 week prior.   Health Maintenance After Age 82 After age 34, you are at a higher risk for certain long-term diseases and infections as well as injuries from falls. Falls are a major cause of broken bones and head injuries in people who are older than age 1. Getting regular preventive care can help to keep you healthy and well. Preventive care includes getting regular testing and making lifestyle changes as recommended by your health care provider. Talk with your health care provider about:  Which screenings and tests you should have. A screening is a test that checks for a disease when you have no symptoms.  A diet and exercise plan that is right for you. What should I know about screenings and tests to prevent falls? Screening and testing are the best ways to find a health problem early. Early diagnosis and treatment give you the best chance of managing medical conditions that are common after age 18. Certain conditions and lifestyle choices may make you more likely to have a fall. Your health care provider may recommend:  Regular vision checks. Poor vision and conditions such as cataracts can make you more likely to have a fall. If you wear glasses, make sure to get your prescription updated if your vision changes.  Medicine review. Work with your health care provider to regularly review all of the medicines you are taking, including over-the-counter medicines. Ask your health care provider about any side effects that may make you more likely to have a fall. Tell your health care provider if any medicines that you take make you feel dizzy or sleepy.  Osteoporosis screening. Osteoporosis is a condition that causes the bones to get weaker. This can make the bones weak and cause them to break more  easily.  Blood pressure screening. Blood pressure changes and medicines to control blood pressure can make you feel dizzy.  Strength and balance checks. Your health care provider may recommend certain tests to check your strength and balance while standing, walking, or changing positions.  Foot health exam. Foot pain and numbness, as well as not wearing proper footwear, can make you more likely to have a fall.  Depression screening. You may be more likely to have a fall if you have a fear of falling, feel emotionally low, or feel unable to do activities that you used to do.  Alcohol use screening. Using too much alcohol can affect your balance and may make you more likely to have a fall. What actions can I take to lower my risk of falls? General instructions  Talk with your health care provider about your risks for falling. Tell your health care provider if: ? You fall. Be sure to tell your health care provider about all falls, even ones that seem minor. ? You feel dizzy, sleepy, or off-balance.  Take over-the-counter and prescription medicines only as told by your health care provider. These include any supplements.  Eat a healthy diet and maintain a healthy weight. A healthy diet includes low-fat dairy products, low-fat (lean) meats, and fiber from whole grains, beans, and lots of fruits and vegetables. Home safety  Remove any tripping hazards, such as rugs, cords, and clutter.  Install safety equipment such as grab bars in bathrooms and safety rails on stairs.  Keep rooms  and walkways well-lit. Activity   Follow a regular exercise program to stay fit. This will help you maintain your balance. Ask your health care provider what types of exercise are appropriate for you.  If you need a cane or walker, use it as recommended by your health care provider.  Wear supportive shoes that have nonskid soles. Lifestyle  Do not drink alcohol if your health care provider tells you not to  drink.  If you drink alcohol, limit how much you have: ? 0-1 drink a day for women. ? 0-2 drinks a day for men.  Be aware of how much alcohol is in your drink. In the [place] one drink equals one typical bottle of beer (12 oz), one-half glass of wine (5 oz), or one shot of hard liquor (1 oz).  Do not use any products that contain nicotine or tobacco, such as cigarettes and e-cigarettes. If you need help quitting, ask your health care provider. Summary  Having a healthy lifestyle and getting preventive care can help to protect your health and wellness after age 40.  Screening and testing are the best way to find a health problem early and help you avoid having a fall. Early diagnosis and treatment give you the best chance for managing medical conditions that are more common for people who are older than age 62.  Falls are a major cause of broken bones and head injuries in people who are older than age 80. Take precautions to prevent a fall at home.  Work with your health care provider to learn what changes you can make to improve your health and wellness and to prevent falls. This information is not intended to replace advice given to you by your health care provider. Make sure you discuss any questions you have with your health care provider. Document Released: 06/18/2017 Document Revised: 11/26/2018 Document Reviewed: 06/18/2017 Elsevier Patient Education  2020 [employer].

## 2019-03-09 NOTE — Assessment & Plan Note (Signed)
Preventative protocols reviewed and updated unless pt declined. Discussed healthy diet and lifestyle.  

## 2019-03-10 DIAGNOSIS — E559 Vitamin D deficiency, unspecified: Secondary | ICD-10-CM | POA: Insufficient documentation

## 2019-03-10 DIAGNOSIS — R4589 Other symptoms and signs involving emotional state: Secondary | ICD-10-CM | POA: Insufficient documentation

## 2019-03-10 NOTE — Assessment & Plan Note (Signed)
Continue trazodone 

## 2019-03-10 NOTE — Assessment & Plan Note (Signed)
Reviewed with patient. Again encouraged tylenol PM in place of aleve PM. Encouraged good hydration status.

## 2019-03-10 NOTE — Assessment & Plan Note (Signed)
Support provided. Encouraged social engagement. Declines medication at this time.

## 2019-03-10 NOTE — Assessment & Plan Note (Signed)
A1c always normal - anticipate falsely low due to chronic anemia give cbg's run 200s.  Patient needs fructosamine as well.

## 2019-03-10 NOTE — Assessment & Plan Note (Signed)
Chronic. LDL at goal, triglycerides remain elevated. Continue pravastatin.  The ASCVD Risk score Alex Bussing DC Jr., et al., 2013) failed to calculate for the following reasons:   The 2013 ASCVD risk score is only valid for ages 48 to 61

## 2019-03-10 NOTE — Assessment & Plan Note (Signed)
rec start 1000 IU daily.  

## 2019-03-10 NOTE — Assessment & Plan Note (Signed)
On both flomax and cardura - will review at next appt.

## 2019-03-10 NOTE — Assessment & Plan Note (Signed)
Continue to encourage healthy diet and lifestyle

## 2019-03-10 NOTE — Assessment & Plan Note (Signed)
Continue b12 supplementation.

## 2019-03-10 NOTE — Assessment & Plan Note (Signed)
Denies significant trouble with this.

## 2019-03-10 NOTE — Assessment & Plan Note (Signed)
Chronic, stable. Continue current regimen. 

## 2019-03-15 ENCOUNTER — Telehealth: Payer: Self-pay | Admitting: *Deleted

## 2019-03-15 DIAGNOSIS — E11319 Type 2 diabetes mellitus with unspecified diabetic retinopathy without macular edema: Secondary | ICD-10-CM

## 2019-03-15 NOTE — Telephone Encounter (Signed)
Ok to do. Podiatry referral placed.

## 2019-03-15 NOTE — Telephone Encounter (Signed)
Voicemail was left by Alex Smith stating that patient is a diabetic and was informed that patient can see a specialist to have his toenails cut. They are requesting to see someone to cut his toenails because he is unable to cut his nails. Please call patient back.

## 2019-03-22 ENCOUNTER — Ambulatory Visit: Payer: Medicare Other | Admitting: Podiatry

## 2019-03-22 ENCOUNTER — Other Ambulatory Visit: Payer: Self-pay | Admitting: Family Medicine

## 2019-03-30 ENCOUNTER — Other Ambulatory Visit: Payer: Self-pay | Admitting: Family Medicine

## 2019-04-14 ENCOUNTER — Other Ambulatory Visit: Payer: Self-pay | Admitting: Family Medicine

## 2019-04-21 ENCOUNTER — Other Ambulatory Visit: Payer: Self-pay

## 2019-04-21 DIAGNOSIS — D61810 Antineoplastic chemotherapy induced pancytopenia: Secondary | ICD-10-CM

## 2019-04-21 DIAGNOSIS — T451X5A Adverse effect of antineoplastic and immunosuppressive drugs, initial encounter: Secondary | ICD-10-CM

## 2019-04-22 ENCOUNTER — Encounter: Payer: Self-pay | Admitting: Oncology

## 2019-04-22 ENCOUNTER — Inpatient Hospital Stay: Payer: Medicare Other

## 2019-04-22 ENCOUNTER — Inpatient Hospital Stay: Payer: Medicare Other | Attending: Oncology | Admitting: Oncology

## 2019-04-22 ENCOUNTER — Other Ambulatory Visit: Payer: Self-pay

## 2019-04-22 VITALS — BP 183/68 | HR 76 | Temp 98.3°F | Resp 20 | Ht 66.25 in | Wt 221.8 lb

## 2019-04-22 DIAGNOSIS — N189 Chronic kidney disease, unspecified: Secondary | ICD-10-CM | POA: Diagnosis not present

## 2019-04-22 DIAGNOSIS — C9140 Hairy cell leukemia not having achieved remission: Secondary | ICD-10-CM | POA: Diagnosis not present

## 2019-04-22 DIAGNOSIS — D631 Anemia in chronic kidney disease: Secondary | ICD-10-CM | POA: Insufficient documentation

## 2019-04-22 DIAGNOSIS — C9000 Multiple myeloma not having achieved remission: Secondary | ICD-10-CM | POA: Diagnosis not present

## 2019-04-22 DIAGNOSIS — R5382 Chronic fatigue, unspecified: Secondary | ICD-10-CM | POA: Insufficient documentation

## 2019-04-22 DIAGNOSIS — D469 Myelodysplastic syndrome, unspecified: Secondary | ICD-10-CM | POA: Diagnosis not present

## 2019-04-22 DIAGNOSIS — D649 Anemia, unspecified: Secondary | ICD-10-CM | POA: Diagnosis not present

## 2019-04-22 DIAGNOSIS — D472 Monoclonal gammopathy: Secondary | ICD-10-CM

## 2019-04-22 DIAGNOSIS — Z87891 Personal history of nicotine dependence: Secondary | ICD-10-CM | POA: Insufficient documentation

## 2019-04-22 DIAGNOSIS — D61810 Antineoplastic chemotherapy induced pancytopenia: Secondary | ICD-10-CM

## 2019-04-22 DIAGNOSIS — D4620 Refractory anemia with excess of blasts, unspecified: Secondary | ICD-10-CM

## 2019-04-22 LAB — COMPREHENSIVE METABOLIC PANEL
ALT: 17 U/L (ref 0–44)
AST: 23 U/L (ref 15–41)
Albumin: 4 g/dL (ref 3.5–5.0)
Alkaline Phosphatase: 58 U/L (ref 38–126)
Anion gap: 10 (ref 5–15)
BUN: 35 mg/dL — ABNORMAL HIGH (ref 8–23)
CO2: 22 mmol/L (ref 22–32)
Calcium: 8.9 mg/dL (ref 8.9–10.3)
Chloride: 106 mmol/L (ref 98–111)
Creatinine, Ser: 1.65 mg/dL — ABNORMAL HIGH (ref 0.61–1.24)
GFR calc Af Amer: 44 mL/min — ABNORMAL LOW (ref >60)
GFR calc non Af Amer: 38 mL/min — ABNORMAL LOW (ref >60)
Glucose, Bld: 275 mg/dL — ABNORMAL HIGH (ref 70–99)
Potassium: 4.4 mmol/L (ref 3.5–5.1)
Sodium: 138 mmol/L (ref 135–145)
Total Bilirubin: 0.5 mg/dL (ref 0.3–1.2)
Total Protein: 7.7 g/dL (ref 6.5–8.1)

## 2019-04-22 LAB — CBC WITH DIFFERENTIAL/PLATELET
Abs Immature Granulocytes: 0.01 10*3/uL (ref 0.00–0.07)
Basophils Absolute: 0 10*3/uL (ref 0.0–0.1)
Basophils Relative: 0 %
Eosinophils Absolute: 0.1 10*3/uL (ref 0.0–0.5)
Eosinophils Relative: 3 %
HCT: 26.7 % — ABNORMAL LOW (ref 39.0–52.0)
Hemoglobin: 8.9 g/dL — ABNORMAL LOW (ref 13.0–17.0)
Immature Granulocytes: 0 %
Lymphocytes Relative: 28 %
Lymphs Abs: 0.8 10*3/uL (ref 0.7–4.0)
MCH: 37.6 pg — ABNORMAL HIGH (ref 26.0–34.0)
MCHC: 33.3 g/dL (ref 30.0–36.0)
MCV: 112.7 fL — ABNORMAL HIGH (ref 80.0–100.0)
Monocytes Absolute: 0.2 10*3/uL (ref 0.1–1.0)
Monocytes Relative: 8 %
Neutro Abs: 1.8 10*3/uL (ref 1.7–7.7)
Neutrophils Relative %: 61 %
Platelets: 102 10*3/uL — ABNORMAL LOW (ref 150–400)
RBC: 2.37 MIL/uL — ABNORMAL LOW (ref 4.22–5.81)
RDW: 14.1 % (ref 11.5–15.5)
WBC: 2.9 10*3/uL — ABNORMAL LOW (ref 4.0–10.5)
nRBC: 0 % (ref 0.0–0.2)

## 2019-04-22 LAB — SAMPLE TO BLOOD BANK

## 2019-04-23 ENCOUNTER — Inpatient Hospital Stay: Payer: Medicare Other

## 2019-04-23 ENCOUNTER — Other Ambulatory Visit: Payer: Self-pay

## 2019-04-23 DIAGNOSIS — N189 Chronic kidney disease, unspecified: Secondary | ICD-10-CM | POA: Diagnosis not present

## 2019-04-23 DIAGNOSIS — D631 Anemia in chronic kidney disease: Secondary | ICD-10-CM | POA: Diagnosis not present

## 2019-04-23 DIAGNOSIS — D649 Anemia, unspecified: Secondary | ICD-10-CM

## 2019-04-23 DIAGNOSIS — C9140 Hairy cell leukemia not having achieved remission: Secondary | ICD-10-CM | POA: Diagnosis not present

## 2019-04-23 DIAGNOSIS — Z87891 Personal history of nicotine dependence: Secondary | ICD-10-CM | POA: Diagnosis not present

## 2019-04-23 DIAGNOSIS — C9000 Multiple myeloma not having achieved remission: Secondary | ICD-10-CM | POA: Diagnosis not present

## 2019-04-23 DIAGNOSIS — R5382 Chronic fatigue, unspecified: Secondary | ICD-10-CM | POA: Diagnosis not present

## 2019-04-23 LAB — KAPPA/LAMBDA LIGHT CHAINS
Kappa free light chain: 74.7 mg/L — ABNORMAL HIGH (ref 3.3–19.4)
Kappa, lambda light chain ratio: 4.7 — ABNORMAL HIGH (ref 0.26–1.65)
Lambda free light chains: 15.9 mg/L (ref 5.7–26.3)

## 2019-04-23 LAB — PREPARE RBC (CROSSMATCH)

## 2019-04-23 MED ORDER — SODIUM CHLORIDE 0.9% IV SOLUTION
250.0000 mL | Freq: Once | INTRAVENOUS | Status: AC
  Administered 2019-04-23: 250 mL via INTRAVENOUS
  Filled 2019-04-23: qty 250

## 2019-04-23 MED ORDER — HEPARIN SOD (PORK) LOCK FLUSH 100 UNIT/ML IV SOLN
500.0000 [IU] | Freq: Every day | INTRAVENOUS | Status: AC | PRN
  Administered 2019-04-23: 11:00:00 500 [IU]

## 2019-04-23 MED ORDER — SODIUM CHLORIDE 0.9% FLUSH
10.0000 mL | INTRAVENOUS | Status: AC | PRN
  Administered 2019-04-23: 10 mL
  Filled 2019-04-23: qty 10

## 2019-04-23 MED ORDER — ACETAMINOPHEN 325 MG PO TABS
650.0000 mg | ORAL_TABLET | Freq: Once | ORAL | Status: AC
  Administered 2019-04-23: 650 mg via ORAL
  Filled 2019-04-23: qty 2

## 2019-04-24 LAB — MULTIPLE MYELOMA PANEL, SERUM
Albumin SerPl Elph-Mcnc: 3.6 g/dL (ref 2.9–4.4)
Albumin/Glob SerPl: 1.3 (ref 0.7–1.7)
Alpha 1: 0.2 g/dL (ref 0.0–0.4)
Alpha2 Glob SerPl Elph-Mcnc: 0.8 g/dL (ref 0.4–1.0)
B-Globulin SerPl Elph-Mcnc: 0.8 g/dL (ref 0.7–1.3)
Gamma Glob SerPl Elph-Mcnc: 1.2 g/dL (ref 0.4–1.8)
Globulin, Total: 3 g/dL (ref 2.2–3.9)
IgA: 16 mg/dL — ABNORMAL LOW (ref 61–437)
IgG (Immunoglobin G), Serum: 1637 mg/dL — ABNORMAL HIGH (ref 603–1613)
IgM (Immunoglobulin M), Srm: 21 mg/dL (ref 15–143)
M Protein SerPl Elph-Mcnc: 1 g/dL — ABNORMAL HIGH
Total Protein ELP: 6.6 g/dL (ref 6.0–8.5)

## 2019-04-24 LAB — BPAM RBC
Blood Product Expiration Date: 202009041535
ISSUE DATE / TIME: 202009040927
Unit Type and Rh: 600

## 2019-04-24 LAB — TYPE AND SCREEN
ABO/RH(D): A NEG
Antibody Screen: NEGATIVE
Unit division: 0

## 2019-04-26 NOTE — Progress Notes (Signed)
Hematology/Oncology Consult note [facility]  [phone] [phone]  Patient Care Team: Ria Bush, MD as PCP - General (Family Medicine) Lequita Asal, MD as Referring Physician (Hematology and Oncology) Crissie Sickles, MD as Referring Physician (Hematology and Oncology) Crissie Sickles, MD as Referring Physician (Hematology and Oncology)   Name of the patient: Alex Smith  [id number]  06-18-1935   Date of visit: 04/26/19  Diagnosis- 1.  History of hairy cell leukemia 2.  Smoldering multiple myeloma under observation 3.  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 [name] and is transitioning his care to me.  He has following issues:  1.  Hairy cell leukemia that was initially diagnosed on bone marrow biopsy in June 2016.  Bone marrow biopsy at that time showed extensive marrow involvement with recurrent hairy cell leukemia approximately 80% of the cells in the core 10% monoclonal plasma cell infiltrate compatible with plasma cell neoplasm.  Peripheral smear revealed leukopenia with a white count of 1900 compatible with hairy cell leukemia.FISH studies revealed an abnormal myeloma panel with CCND1/IGH translocation t (11;14) and loss of MAF/16q. FISH studies for MDS were negative. Cytogenetics were normal (46, XY).  He has last received cladribine for this back in July 2016  2.  Smoldering multiple myeloma: Bone marrow biopsy in March 2017 revealed persistent plasma cell neoplasm with monoclonal plasma cells estimated at 20 to 30%.  No morphologic evidence of residual hairy cell leukemia.Marrow was normocellular for age with relative erythroid hyperplasia, relative myeloid hypoplasia, mild dyspoiesis, adequate megakaryocytes and no increased blasts. There was no significant increase in  marrow reticulin fibers. Iron was present. FISH studies for MDS were negative. FISH panel for myeloma revealed CCND1/IGH translocation- t(11;14) and loss of MAF/16q. Cytogenetics were normal (46, XY).  Last bone marrow biopsy was in October 2018 which showed variably cellular marrow 10 to 50% with kappa restricted plasmacytosis of 10 to 15%.  No evidence of leukemia lymphoma or high-grade dysplasia.  Normal cytogenetics.  He has also been followed by Dr. [name] at [facility].  He has received Revlimid and Decadron in the past in 2017 which was subsequently stopped.  PET scan in July 2017 showed no hypermetabolic adenopathy or focal skeletal lesions.  3.  Probable MDS: He has received a trial of Procrit in the past in 2017 but apparently did not respond to it.  He has been getting periodic blood transfusions to maintain his hemoglobin closer to 9 as he is symptomatic when his hemoglobin is less than 9.  He has been getting about 2-3 blood transfusions during the year.  He does have some mild leukopenia/neutropenia and is on acyclovir prophylaxis.  He gets weekly G-CSF when his ANC is less than 1.5.  4.  Also has history of B12 deficiency for which he is on oral B12.   Interval history- he feels fatigued. He was een by DR. Reeves reecntly and was recommended luspatercept. It has been approved for him with minor co pay. He has not started taking it yet. He has chronic fatigue. Denies other complaints at this time  ECOG PS- 1 Pain scale- 0   Review of systems- Review of Systems  Constitutional: Positive for malaise/fatigue. Negative for chills, fever and weight loss.  HENT: Negative for congestion, ear discharge and nosebleeds.   Eyes: Negative for blurred vision.  Respiratory: Negative for cough, hemoptysis, sputum production, shortness of breath and wheezing.   Cardiovascular: Negative for chest pain, palpitations, orthopnea and claudication.  Gastrointestinal: Negative for abdominal pain,  blood in stool, constipation, diarrhea, heartburn, melena, nausea and vomiting.  Genitourinary: Negative for dysuria, flank pain, frequency, hematuria and urgency.  Musculoskeletal: Negative for back pain, joint pain and myalgias.  Skin: Negative for rash.  Neurological: Negative for dizziness, tingling, focal weakness, seizures, weakness and headaches.  Endo/Heme/Allergies: Does not bruise/bleed easily.  Psychiatric/Behavioral: Negative for depression and suicidal ideas. The patient does not have insomnia.      Allergies  Allergen Reactions  . Rituximab Rash    Chest tightness Chest tightness  . Blood-Group Specific Substance Other (See Comments)    Had a post transfusion reaction of red blood cells; NOW REQUIRES WASHED BLOOD CELLS Had a post transfusion reaction of red blood cells; NOW REQUIRES WASHED BLOOD CELLS  . Primaxin [Imipenem] Other (See Comments)    Possible allergy Possible allergy  . Voriconazole Other (See Comments)  . Sulfa Antibiotics Itching and Rash  . Sulfacetamide Sodium Itching and Rash     Past Medical History:  Diagnosis Date  . Anemia   . B12 deficiency   . Basal cell carcinoma of face   . BPH (benign prostatic hypertrophy)    followed by urology, discharged (Dr. Bernardo Heater)  . CAP (community acquired pneumonia) 02/15/2015  . CKD stage 3 due to type 2 diabetes mellitus ([place]) 11/14/2017  . Colon polyps   . Diverticulosis   . GERD (gastroesophageal reflux disease)   . Hairy cell leukemia ([place]) 2006   recurrent, seizure on rituxan, now on cladribine ([place])  . History of pneumonia 2000's   "once" (07/07/2012)  . History of shingles   . HLD (hyperlipidemia)   . Hypertension   . Pneumonia   . Shortness of breath dyspnea   . Systolic murmur 69/62/9528  . Type 2 diabetes, controlled, with retinopathy ([place])      Past Surgical History:  Procedure Laterality Date  . [place] VITRECTOMY WITH 20 GAUGE MVR PORT FOR MACULAR HOLE  07/07/2012    Procedure: 25 GAUGE PARS PLANA VITRECTOMY WITH 20 GAUGE MVR PORT FOR MACULAR HOLE;  Surgeon: Hayden Pedro, MD;  Location: [employer];  Service: Ophthalmology;  Laterality: Left;  . BONE MARROW BIOPSY  2016  . CARDIOVASCULAR STRESS TEST  2013   treadmill - no evidence ischemia, EF 61%  . CATARACT EXTRACTION W/ INTRAOCULAR LENS  IMPLANT, BILATERAL  ~ 2010  . COLONOSCOPY  2014   [place] WNL no rpt needed, h/o polyps  . EYE SURGERY Left 06/2012   laser surgery  . GAS INSERTION  07/07/2012   Procedure: INSERTION OF GAS;  Surgeon: Hayden Pedro, MD;  Location: [facility];  Service: Ophthalmology;  Laterality: Left;  C3F8  . PERIPHERAL VASCULAR CATHETERIZATION N/A 02/23/2015   Procedure: Glori Luis Cath Insertion;  Surgeon: Algernon Huxley, MD;  Location: [facility];  Service: Cardiovascular;  Laterality: N/A;  . SERUM PATCH  07/07/2012   Procedure: SERUM PATCH;  Surgeon: Hayden Pedro, MD;  Location: [place];  Service: Ophthalmology;  Laterality: Left;  . SKIN CANCER EXCISION     "all over my face" (07/07/2012)    Social History   Socioeconomic History  . Marital status: Married    Spouse name: Not on file  . Number of children: Not on file  . Years of education: Not on file  .  Highest education level: Not on file  Occupational History  . Not on file  Social Needs  . Financial resource strain: Not on file  . Food insecurity    Worry: Not on file    Inability: Not on file  . Transportation needs    Medical: Not on file    Non-medical: Not on file  Tobacco Use  . Smoking status: Former Smoker    Packs/day: 2.00    Years: 25.00    Pack years: 50.00    Types: Cigarettes    Quit date: 02/06/1969    Years since quitting: 50.2  . Smokeless tobacco: Never Used  . Tobacco comment: 07/07/2012 "stopped smoking ~ 40 yr ago; smoked 20-5yr  Substance and Sexual Activity  . Alcohol use: Yes    Alcohol/week: 0.0 standard drinks    Comment: rare  . Drug use: No  . Sexual activity: Never   Lifestyle  . Physical activity    Days per week: Not on file    Minutes per session: Not on file  . Stress: Not on file  Relationships  . Social [place] phone: Not on file    Gets together: Not on file    Attends religious service: Not on file    Active member of club or organization: Not on file    Attends meetings of clubs or organizations: Not on file    Relationship status: Not on file  . Intimate partner violence    Fear of current or ex partner: Not on file    Emotionally abused: Not on file    Physically abused: Not on file    Forced sexual activity: Not on file  Other Topics Concern  . Not on file  Social History Narrative   Married >50 yrs, cares for wife. 1 cat   Occupation: was cArt gallery manager works part time for [employer]   Activity: likes to golf   Diet: moderate water, fruits/vegetables daily    Family History  Problem Relation Age of Onset  . Dementia Mother   . Heart failure Father 739 . Cancer Sister        breast  . Diabetes Paternal Uncle   . Diabetes Paternal Aunt   . CAD Brother 418      MI  . Stroke Neg Hx      Current Outpatient Medications:  .  acyclovir (ZOVIRAX) 400 MG tablet, TAKE TWO TABLETS TWICE DAILY, Disp: 120 tablet, Rfl: 3 .  aspirin EC 81 MG tablet, Take 81 mg by mouth daily., Disp: , Rfl:  .  doxazosin (CARDURA) 1 MG tablet, TAKE ONE TABLET EVERY DAY, Disp: 30 tablet, Rfl: 5 .  famotidine (PEPCID) 20 MG tablet, Take 1 tablet (20 mg total) by mouth at bedtime., Disp: 90 tablet, Rfl: 3 .  glucose blood (ACCU-CHEK AVIVA PLUS) test strip, USE TO CHECK SUGAR DAILY E11.319, Disp: 200 each, Rfl: 3 .  HUMULIN N 100 UNIT/ML injection, INJECT 39 UNITS SUBCUTANEOUSLY TWICE DAILY BEFORE A MEAL, Disp: 20 mL, Rfl: 5 .  HUMULIN R 100 UNIT/ML injection, INJECT 10 UNITS INTO THE SKIN TWICE DAILY A MEAL, Disp: 10 mL, Rfl: 5 .  hydrochlorothiazide (HYDRODIURIL) 25 MG tablet, TAKE 1 TABLET BY MOUTH DAILY, Disp: 90 tablet, Rfl: 2 .   lidocaine-prilocaine (EMLA) cream, Apply 1 application topically as needed., Disp: 30 g, Rfl: 3 .  lisinopril (PRINIVIL,ZESTRIL) 20 MG tablet, TAKE ONE (1) TABLET BY MOUTH TWO TIMES PER DAY,  Disp: 90 tablet, Rfl: 1 .  metFORMIN (GLUCOPHAGE) 1000 MG tablet, TAKE ONE (1) TABLET BY MOUTH TWO TIMES PER DAY, Disp: 180 tablet, Rfl: 3 .  MICROLET LANCETS MISC, USE AS DIRECTED to check sugars daily E11.319, Disp: 200 each, Rfl: 3 .  pravastatin (PRAVACHOL) 20 MG tablet, TAKE 1 TABLET BY MOUTH EVERY EVENING, Disp: 30 tablet, Rfl: 5 .  vitamin B-12 (CYANOCOBALAMIN) 1000 MCG tablet, Take 1 tablet (1,000 mcg total) by mouth daily., Disp: 90 tablet, Rfl: 2 .  vitamin E 400 UNIT capsule, Take 200 Units by mouth daily. , Disp: , Rfl:  .  tamsulosin (FLOMAX) 0.4 MG CAPS capsule, TAKE 1 CAPSULE BY MOUTH EVERY DAY (Patient not taking: Reported on 04/21/2019), Disp: 30 capsule, Rfl: 3 .  traZODone (DESYREL) 50 MG tablet, TAKE 1/2 TO 1 TABLET BY MOUTH AT [facility] NEEDED FOR SLEEP (Patient not taking: Reported on 04/21/2019), Disp: 30 tablet, Rfl: 0  Current Facility-Administered Medications:  .  Tbo-Filgrastim (GRANIX) injection 480 mcg, 480 mcg, Subcutaneous, Once, [place], Melissa C, MD  Facility-Administered Medications Ordered in Other Visits:  .  heparin lock flush 100 unit/mL, 500 Units, Intravenous, Once, [place], Melissa C, MD .  sodium chloride 0.9 % injection 10 mL, 10 mL, Intravenous, PRN, [name], Melissa C, MD, 10 mL at 03/03/15 0903 .  sodium chloride 0.9 % injection 10 mL, 10 mL, Intracatheter, PRN, [name], Melissa C, MD, 10 mL at 03/10/15 1410 .  sodium chloride flush (NS) 0.9 % injection 10 mL, 10 mL, Intravenous, PRN, [name], Melissa C, MD, 10 mL at 07/23/18 1517  Physical exam:  Vitals:   04/22/19 1034  BP: (!) 183/68  Pulse: 76  Resp: 20  Temp: 98.3 F (36.8 C)  TempSrc: Tympanic  Weight: 221 lb 12.8 oz (100.6 kg)  Height: 5' 6.25" (1.683 m)   Physical Exam Constitutional:       General: He is not in acute distress. HENT:     Head: Normocephalic and atraumatic.  Eyes:     Pupils: Pupils are equal, round, and reactive to light.  Neck:     Musculoskeletal: Normal range of motion.  Cardiovascular:     Rate and Rhythm: Normal rate and regular rhythm.     Heart sounds: Normal heart sounds.  Pulmonary:     Effort: Pulmonary effort is normal.     Breath sounds: Normal breath sounds.  Abdominal:     General: Bowel sounds are normal.     Palpations: Abdomen is soft.  Skin:    General: Skin is warm and dry.  Neurological:     Mental Status: He is alert and oriented to person, place, and time.      CMP Latest Ref Rng & Units 04/22/2019  Glucose 70 - 99 mg/dL 275(H)  BUN 8 - 23 mg/dL 35(H)  Creatinine 0.61 - 1.24 mg/dL 1.65(H)  Sodium 135 - 145 mmol/L 138  Potassium 3.5 - 5.1 mmol/L 4.4  Chloride 98 - 111 mmol/L 106  CO2 22 - 32 mmol/L 22  Calcium 8.9 - 10.3 mg/dL 8.9  Total Protein 6.5 - 8.1 g/dL 7.7  Total Bilirubin 0.3 - 1.2 mg/dL 0.5  Alkaline Phos 38 - 126 U/L 58  AST 15 - 41 U/L 23  ALT 0 - 44 U/L 17   CBC Latest Ref Rng & Units 04/22/2019  WBC 4.0 - 10.5 K/uL 2.9(L)  Hemoglobin 13.0 - 17.0 g/dL 8.9(L)  Hematocrit 39.0 - 52.0 % 26.7(L)  Platelets 150 - 400 K/uL 102(L)  No images are attached to the encounter.  No results found.   Assessment and plan- Patient is a 84 y.o. male here for follow up of smoldering multiple myeloma and MDS  1. Smoldering multiple myeloma: M protein has remained stable around 1gm. FLC ratio around 4-5. Stable continue to monitor.  2. Anemia- multifactorial- secondary to anemia of chronic kidney disease and MDS. He has not responded to EPO. Consideration is being given to luspatercept per DR. Evelene Croon from [place]. He is awaiting to start it. Goal hb for him has been 9. He will therefore receive washed irradiated PRBCs tomorrow  I will see him back in 4 months with cbc with diff, cmp, myeloma panel and serum free light  chains    Visit Diagnosis 1. Symptomatic anemia      Dr. Randa Evens, MD, MPH [facility] [phone] 04/26/2019 4:01 PM

## 2019-04-29 ENCOUNTER — Other Ambulatory Visit: Payer: Self-pay | Admitting: Family Medicine

## 2019-04-30 NOTE — Telephone Encounter (Signed)
Acyclovir Last filled:  03/30/19, #120 Last OV:  03/09/19, AWV Next OV:  09/10/19, 6 mo f/u

## 2019-06-01 ENCOUNTER — Other Ambulatory Visit: Payer: Self-pay

## 2019-06-01 MED ORDER — VITAMIN B-12 1000 MCG PO TABS: 1000.0000 ug | ORAL_TABLET | Freq: Every day | ORAL | 3 refills | Status: AC

## 2019-06-02 DIAGNOSIS — C9000 Multiple myeloma not having achieved remission: Principal | ICD-10-CM

## 2019-06-04 ENCOUNTER — Encounter: Admit: 2019-06-04 | Discharge: 2019-06-05 | Payer: MEDICARE

## 2019-06-04 DIAGNOSIS — C9000 Multiple myeloma not having achieved remission: Principal | ICD-10-CM

## 2019-06-07 ENCOUNTER — Encounter: Admit: 2019-06-07 | Discharge: 2019-06-08 | Payer: MEDICARE

## 2019-06-07 ENCOUNTER — Encounter: Admit: 2019-06-07 | Discharge: 2019-06-08 | Payer: MEDICARE | Attending: Adult Health | Primary: Adult Health

## 2019-06-07 DIAGNOSIS — D4620 Refractory anemia with excess of blasts, unspecified: Principal | ICD-10-CM

## 2019-06-07 DIAGNOSIS — C9000 Multiple myeloma not having achieved remission: Principal | ICD-10-CM

## 2019-06-08 ENCOUNTER — Other Ambulatory Visit: Payer: Self-pay | Admitting: Family Medicine

## 2019-07-08 DIAGNOSIS — N183 Chronic kidney disease, stage 3 unspecified: Secondary | ICD-10-CM | POA: Diagnosis not present

## 2019-07-08 DIAGNOSIS — C9000 Multiple myeloma not having achieved remission: Secondary | ICD-10-CM | POA: Diagnosis not present

## 2019-07-08 DIAGNOSIS — E1169 Type 2 diabetes mellitus with other specified complication: Secondary | ICD-10-CM | POA: Diagnosis not present

## 2019-07-08 DIAGNOSIS — D649 Anemia, unspecified: Secondary | ICD-10-CM | POA: Diagnosis not present

## 2019-07-23 ENCOUNTER — Inpatient Hospital Stay: Payer: Medicare Other | Attending: Hematology and Oncology

## 2019-07-23 ENCOUNTER — Other Ambulatory Visit: Payer: Self-pay

## 2019-07-23 DIAGNOSIS — Z452 Encounter for adjustment and management of vascular access device: Secondary | ICD-10-CM | POA: Insufficient documentation

## 2019-07-23 DIAGNOSIS — Z95828 Presence of other vascular implants and grafts: Secondary | ICD-10-CM

## 2019-07-23 DIAGNOSIS — C9141 Hairy cell leukemia, in remission: Secondary | ICD-10-CM | POA: Insufficient documentation

## 2019-07-23 DIAGNOSIS — C9000 Multiple myeloma not having achieved remission: Secondary | ICD-10-CM | POA: Diagnosis not present

## 2019-07-23 MED ORDER — HEPARIN SOD (PORK) LOCK FLUSH 100 UNIT/ML IV SOLN
500.0000 [IU] | Freq: Once | INTRAVENOUS | Status: AC
  Administered 2019-07-23: 500 [IU] via INTRAVENOUS

## 2019-07-23 MED ORDER — SODIUM CHLORIDE 0.9% FLUSH
10.0000 mL | Freq: Once | INTRAVENOUS | Status: AC
  Administered 2019-07-23: 10 mL via INTRAVENOUS
  Filled 2019-07-23: qty 10

## 2019-07-26 ENCOUNTER — Other Ambulatory Visit: Payer: Self-pay | Admitting: Family Medicine

## 2019-08-22 ENCOUNTER — Other Ambulatory Visit: Payer: Self-pay | Admitting: *Deleted

## 2019-08-22 DIAGNOSIS — C9000 Multiple myeloma not having achieved remission: Secondary | ICD-10-CM

## 2019-08-22 DIAGNOSIS — D472 Monoclonal gammopathy: Secondary | ICD-10-CM

## 2019-08-22 DIAGNOSIS — D469 Myelodysplastic syndrome, unspecified: Secondary | ICD-10-CM

## 2019-08-22 DIAGNOSIS — D649 Anemia, unspecified: Secondary | ICD-10-CM

## 2019-08-23 ENCOUNTER — Inpatient Hospital Stay: Payer: Medicare Other | Admitting: Oncology

## 2019-08-23 ENCOUNTER — Ambulatory Visit: Payer: Medicare Other

## 2019-08-23 ENCOUNTER — Other Ambulatory Visit: Payer: Self-pay | Admitting: Family Medicine

## 2019-08-23 ENCOUNTER — Telehealth: Payer: Self-pay | Admitting: Oncology

## 2019-08-23 ENCOUNTER — Inpatient Hospital Stay: Payer: Medicare Other

## 2019-08-23 NOTE — Telephone Encounter (Signed)
Patient did not attend lab and MD appt on 08-23-19. Phoned to reschedule and could not reach patient. Appts for 08-23-19 and 08-24-19 have been cancelled as 08-24-19 appt was pending outcome of lab work on 08-23-19. Voicemail left requesting patient phone to reschedule all appts.

## 2019-08-24 ENCOUNTER — Other Ambulatory Visit: Payer: Self-pay | Admitting: Family Medicine

## 2019-08-24 ENCOUNTER — Inpatient Hospital Stay: Payer: Medicare Other

## 2019-08-25 NOTE — Progress Notes (Signed)
Patient has question about covid vaccine where can he get one

## 2019-08-26 ENCOUNTER — Other Ambulatory Visit: Payer: Self-pay

## 2019-08-26 ENCOUNTER — Inpatient Hospital Stay (HOSPITAL_BASED_OUTPATIENT_CLINIC_OR_DEPARTMENT_OTHER): Payer: Medicare Other | Admitting: Oncology

## 2019-08-26 ENCOUNTER — Inpatient Hospital Stay: Payer: Medicare Other | Attending: Hematology and Oncology | Admitting: *Deleted

## 2019-08-26 VITALS — BP 175/60 | HR 88 | Temp 98.1°F | Resp 18 | Wt 222.0 lb

## 2019-08-26 DIAGNOSIS — Z95828 Presence of other vascular implants and grafts: Secondary | ICD-10-CM

## 2019-08-26 DIAGNOSIS — E538 Deficiency of other specified B group vitamins: Secondary | ICD-10-CM | POA: Insufficient documentation

## 2019-08-26 DIAGNOSIS — Z87891 Personal history of nicotine dependence: Secondary | ICD-10-CM | POA: Diagnosis not present

## 2019-08-26 DIAGNOSIS — C9000 Multiple myeloma not having achieved remission: Secondary | ICD-10-CM | POA: Insufficient documentation

## 2019-08-26 DIAGNOSIS — D472 Monoclonal gammopathy: Secondary | ICD-10-CM

## 2019-08-26 DIAGNOSIS — N189 Chronic kidney disease, unspecified: Secondary | ICD-10-CM | POA: Diagnosis not present

## 2019-08-26 DIAGNOSIS — D649 Anemia, unspecified: Secondary | ICD-10-CM

## 2019-08-26 DIAGNOSIS — C9140 Hairy cell leukemia not having achieved remission: Secondary | ICD-10-CM | POA: Diagnosis not present

## 2019-08-26 DIAGNOSIS — D631 Anemia in chronic kidney disease: Secondary | ICD-10-CM | POA: Insufficient documentation

## 2019-08-26 DIAGNOSIS — R5382 Chronic fatigue, unspecified: Secondary | ICD-10-CM | POA: Insufficient documentation

## 2019-08-26 DIAGNOSIS — D469 Myelodysplastic syndrome, unspecified: Secondary | ICD-10-CM | POA: Diagnosis not present

## 2019-08-26 DIAGNOSIS — D709 Neutropenia, unspecified: Secondary | ICD-10-CM | POA: Insufficient documentation

## 2019-08-26 LAB — CBC WITH DIFFERENTIAL/PLATELET
Abs Immature Granulocytes: 0.01 10*3/uL (ref 0.00–0.07)
Basophils Absolute: 0 10*3/uL (ref 0.0–0.1)
Basophils Relative: 0 %
Eosinophils Absolute: 0.1 10*3/uL (ref 0.0–0.5)
Eosinophils Relative: 3 %
HCT: 25.8 % — ABNORMAL LOW (ref 39.0–52.0)
Hemoglobin: 8.4 g/dL — ABNORMAL LOW (ref 13.0–17.0)
Immature Granulocytes: 0 %
Lymphocytes Relative: 27 %
Lymphs Abs: 0.8 10*3/uL (ref 0.7–4.0)
MCH: 37.2 pg — ABNORMAL HIGH (ref 26.0–34.0)
MCHC: 32.6 g/dL (ref 30.0–36.0)
MCV: 114.2 fL — ABNORMAL HIGH (ref 80.0–100.0)
Monocytes Absolute: 0.2 10*3/uL (ref 0.1–1.0)
Monocytes Relative: 6 %
Neutro Abs: 1.9 10*3/uL (ref 1.7–7.7)
Neutrophils Relative %: 64 %
Platelets: 105 10*3/uL — ABNORMAL LOW (ref 150–400)
RBC: 2.26 MIL/uL — ABNORMAL LOW (ref 4.22–5.81)
RDW: 14.1 % (ref 11.5–15.5)
WBC: 2.9 10*3/uL — ABNORMAL LOW (ref 4.0–10.5)
nRBC: 0 % (ref 0.0–0.2)

## 2019-08-26 LAB — COMPREHENSIVE METABOLIC PANEL
ALT: 18 U/L (ref 0–44)
AST: 22 U/L (ref 15–41)
Albumin: 3.9 g/dL (ref 3.5–5.0)
Alkaline Phosphatase: 55 U/L (ref 38–126)
Anion gap: 6 (ref 5–15)
BUN: 22 mg/dL (ref 8–23)
CO2: 24 mmol/L (ref 22–32)
Calcium: 8.7 mg/dL — ABNORMAL LOW (ref 8.9–10.3)
Chloride: 107 mmol/L (ref 98–111)
Creatinine, Ser: 1.53 mg/dL — ABNORMAL HIGH (ref 0.61–1.24)
GFR calc Af Amer: 48 mL/min — ABNORMAL LOW (ref >60)
GFR calc non Af Amer: 42 mL/min — ABNORMAL LOW (ref >60)
Glucose, Bld: 268 mg/dL — ABNORMAL HIGH (ref 70–99)
Potassium: 4 mmol/L (ref 3.5–5.1)
Sodium: 137 mmol/L (ref 135–145)
Total Bilirubin: 0.6 mg/dL (ref 0.3–1.2)
Total Protein: 7.3 g/dL (ref 6.5–8.1)

## 2019-08-26 LAB — SAMPLE TO BLOOD BANK

## 2019-08-26 MED ORDER — HEPARIN SOD (PORK) LOCK FLUSH 100 UNIT/ML IV SOLN
500.0000 [IU] | Freq: Once | INTRAVENOUS | Status: AC
  Administered 2019-08-26: 14:00:00 500 [IU] via INTRAVENOUS
  Filled 2019-08-26: qty 5

## 2019-08-26 MED ORDER — SODIUM CHLORIDE 0.9% FLUSH
10.0000 mL | Freq: Once | INTRAVENOUS | Status: AC
  Administered 2019-08-26: 10 mL via INTRAVENOUS
  Filled 2019-08-26: qty 10

## 2019-08-27 ENCOUNTER — Inpatient Hospital Stay: Payer: Medicare Other

## 2019-08-27 LAB — KAPPA/LAMBDA LIGHT CHAINS
Kappa free light chain: 62.9 mg/L — ABNORMAL HIGH (ref 3.3–19.4)
Kappa, lambda light chain ratio: 5.07 — ABNORMAL HIGH (ref 0.26–1.65)
Lambda free light chains: 12.4 mg/L (ref 5.7–26.3)

## 2019-08-29 ENCOUNTER — Encounter: Payer: Self-pay | Admitting: Oncology

## 2019-08-29 NOTE — Progress Notes (Signed)
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 [name] and is transitioning his care to me. He has following issues:  1.Hairy cell leukemia that was initially diagnosed on bone marrow biopsy in June 2016. Bone marrow biopsy at that time showed extensive marrow involvement with recurrent hairy cell leukemia approximately 80% of the cells in the core 10% monoclonal plasma cell infiltrate compatible with plasma cell neoplasm. Peripheral smear revealed leukopenia with a white count of 1900 compatible with hairy cell leukemia.FISH studies revealed an abnormal myeloma panel with CCND1/IGH translocation t (11;14) and loss of MAF/16q. FISH studies for MDS were negative. Cytogenetics were normal (46, XY).He has last received cladribine for this back in July 2016  2.Smoldering multiple myeloma: Bone marrow biopsy in March 2017 revealed persistent plasma cell neoplasm with monoclonal plasma cells estimated at 20 to 30%. No morphologic evidence of residual hairy cell leukemia.Marrow was normocellular for age with relative erythroid hyperplasia, relative myeloid hypoplasia, mild dyspoiesis, adequate megakaryocytes and no increased blasts. There was no significant increase in  marrow reticulin fibers. Iron was present. FISH studies for MDS were negative. FISH panel for myeloma revealed CCND1/IGH translocation- t(11;14) and loss of MAF/16q. Cytogenetics were normal (46, XY).Last bone marrow biopsy was in October 2018 which showed variably cellular marrow 10 to 50% with kappa restricted plasmacytosis of 10 to 15%. No evidence of leukemia lymphoma or high-grade dysplasia. Normal cytogenetics. He has also been followed by Dr. [name] at [facility]. He has received Revlimid and Decadron in the past in 2017 which was subsequently stopped. PET scan in July 2017 showed no hypermetabolic adenopathy or focal skeletal lesions.  3.Probable MDS: He has received a trial of Procrit in the past in 2017 but apparently did not respond to it. He has been getting periodic blood transfusions to maintain his hemoglobin closer to 9 as he is symptomatic when his hemoglobin is less than 9. He has been getting about 2-3 blood transfusions during the year. He does have some mild leukopenia/neutropenia and is on acyclovir prophylaxis. He gets weekly G-CSF when his ANC is less than 1.5.  4.Also has history of B12 deficiency for which he is on oral B12.   Interval history- he has chronic fatigue that has remained unchanged. He is still awaiting to start luspatercept  ECOG PS- 1 Pain scale- 0  Review of systems- Review of Systems  Constitutional: Negative for chills, fever, malaise/fatigue and weight loss.  HENT: Negative for congestion, ear discharge and nosebleeds.   Eyes: Negative for blurred vision.  Respiratory: Negative for cough, hemoptysis, sputum production, shortness of breath and wheezing.   Cardiovascular: Negative for chest pain, palpitations, orthopnea and claudication.  Gastrointestinal: Negative for abdominal pain, blood in stool, constipation, diarrhea, heartburn, melena, nausea and vomiting.  Genitourinary: Negative for dysuria, flank pain, frequency, hematuria  and urgency.  Musculoskeletal: Negative for back pain, joint pain  and myalgias.  Skin: Negative for rash.  Neurological: Negative for dizziness, tingling, focal weakness, seizures, weakness and headaches.  Endo/Heme/Allergies: Does not bruise/bleed easily.  Psychiatric/Behavioral: Negative for depression and suicidal ideas. The patient does not have insomnia.       Allergies  Allergen Reactions  . Rituximab Rash    Chest tightness Chest tightness  . Blood-Group Specific Substance Other (See Comments)    Had a post transfusion reaction of red blood cells; NOW REQUIRES WASHED BLOOD CELLS Had a post transfusion reaction of red blood cells; NOW REQUIRES WASHED BLOOD CELLS  . Primaxin [Imipenem] Other (See Comments)    Possible allergy Possible allergy  . Voriconazole Other (See Comments)  . Sulfa Antibiotics Itching and Rash  . Sulfacetamide Sodium Itching and Rash     Past Medical History:  Diagnosis Date  . Anemia   . B12 deficiency   . Basal cell carcinoma of face   . BPH (benign prostatic hypertrophy)    followed by urology, discharged (Dr. Bernardo Heater)  . CAP (community acquired pneumonia) 02/15/2015  . CKD stage 3 due to type 2 diabetes mellitus ([place]) 11/14/2017  . Colon polyps   . Diverticulosis   . GERD (gastroesophageal reflux disease)   . Hairy cell leukemia ([place]) 2006   recurrent, seizure on rituxan, now on cladribine ([place])  . History of pneumonia 2000's   "once" (07/07/2012)  . History of shingles   . HLD (hyperlipidemia)   . Hypertension   . Pneumonia   . Shortness of breath dyspnea   . Systolic murmur 82/64/1583  . Type 2 diabetes, controlled, with retinopathy ([place])      Past Surgical History:  Procedure Laterality Date  . [place] VITRECTOMY WITH 20 GAUGE MVR PORT FOR MACULAR HOLE  07/07/2012   Procedure: 25 GAUGE PARS PLANA VITRECTOMY WITH 20 GAUGE MVR PORT FOR MACULAR HOLE;  Surgeon: Hayden Pedro, MD;  Location: [place];  Service:  Ophthalmology;  Laterality: Left;  . BONE MARROW BIOPSY  2016  . CARDIOVASCULAR STRESS TEST  2013   treadmill - no evidence ischemia, EF 61%  . CATARACT EXTRACTION W/ INTRAOCULAR LENS  IMPLANT, BILATERAL  ~ 2010  . COLONOSCOPY  2014   [place] WNL no rpt needed, h/o polyps  . EYE SURGERY Left 06/2012   laser surgery  . GAS INSERTION  07/07/2012   Procedure: INSERTION OF GAS;  Surgeon: Hayden Pedro, MD;  Location: [place];  Service: Ophthalmology;  Laterality: Left;  C3F8  . PERIPHERAL VASCULAR CATHETERIZATION N/A 02/23/2015   Procedure: Glori Luis Cath Insertion;  Surgeon: Algernon Huxley, MD;  Location: [place] [facility];  Service: Cardiovascular;  Laterality: N/A;  . SERUM PATCH  07/07/2012   Procedure: SERUM PATCH;  Surgeon: Hayden Pedro, MD;  Location: [place];  Service: Ophthalmology;  Laterality: Left;  . SKIN CANCER EXCISION     "all over my face" (07/07/2012)    Social History   Socioeconomic History  . Marital status: Married    Spouse name: Not on file  . Number of children: Not on file  . Years of education: Not on file  . Highest education level: Not on file  Occupational History  . Not on file  Tobacco Use  . Smoking status: Former Smoker    Packs/day: 2.00    Years: 25.00    Pack years: 50.00    Types: Cigarettes    Quit date: 02/06/1969    Years since quitting: 50.5  .  Smokeless tobacco: Never Used  . Tobacco comment: 07/07/2012 "stopped smoking ~ 40 yr ago; smoked 20-42yr  Substance and Sexual Activity  . Alcohol use: Yes    Alcohol/week: 0.0 standard drinks    Comment: rare  . Drug use: No  . Sexual activity: Never  Other Topics Concern  . Not on file  Social History Narrative   Married >50 yrs, cares for wife. 1 cat   Occupation: was cArt gallery manager works part time for [employer]   Activity: likes to golf   Diet: moderate water, fruits/vegetables daily   Social Determinants of Health   Financial Resource Strain:   . Difficulty of Paying Living  Expenses: Not on file  Food Insecurity:   . Worried About RCharity fundraiserin the Last Year: Not on file  . Ran Out of Food in the Last Year: Not on file  Transportation Needs:   . Lack of Transportation (Medical): Not on file  . Lack of Transportation (Non-Medical): Not on file  Physical Activity:   . Days of Exercise per Week: Not on file  . Minutes of Exercise per Session: Not on file  Stress:   . Feeling of Stress : Not on file  Social Connections:   . Frequency of Communication with Friends and Family: Not on file  . Frequency of Social Gatherings with Friends and Family: Not on file  . Attends Religious Services: Not on file  . Active Member of Clubs or Organizations: Not on file  . Attends CArchivistMeetings: Not on file  . Marital Status: Not on file  Intimate Partner Violence:   . Fear of Current or Ex-Partner: Not on file  . Emotionally Abused: Not on file  . Physically Abused: Not on file  . Sexually Abused: Not on file    Family History  Problem Relation Age of Onset  . Dementia Mother   . Heart failure Father 766 . Cancer Sister        breast  . Diabetes Paternal Uncle   . Diabetes Paternal Aunt   . CAD Brother 454      MI  . Stroke Neg Hx      Current Outpatient Medications:  .  acyclovir (ZOVIRAX) 400 MG tablet, TAKE TWO TABLETS TWICE DAILY, Disp: 120 tablet, Rfl: 3 .  aspirin EC 81 MG tablet, Take 81 mg by mouth daily., Disp: , Rfl:  .  doxazosin (CARDURA) 1 MG tablet, TAKE ONE TABLET EVERY DAY, Disp: 30 tablet, Rfl: 5 .  famotidine (PEPCID) 20 MG tablet, Take 1 tablet (20 mg total) by mouth at bedtime., Disp: 90 tablet, Rfl: 3 .  famotidine (PEPCID) 40 MG tablet, TAKE HALF A TABLET BY MOUTH AT BEDTIME, Disp: 45 tablet, Rfl: 2 .  glucose blood (ACCU-CHEK AVIVA PLUS) test strip, USE TO CHECK SUGAR DAILY E11.319, Disp: 200 each, Rfl: 3 .  HUMULIN N 100 UNIT/ML injection, INJECT 39 UNITS SUBCUTANEOUSLY TWICE DAILY BEFORE A MEAL, Disp: 20 mL,  Rfl: 5 .  HUMULIN R 100 UNIT/ML injection, INJECT 10 UNITS INTO THE SKIN TWICE DAILY A MEAL, Disp: 10 mL, Rfl: 5 .  hydrochlorothiazide (HYDRODIURIL) 25 MG tablet, TAKE 1 TABLET BY MOUTH DAILY, Disp: 90 tablet, Rfl: 2 .  lidocaine-prilocaine (EMLA) cream, Apply 1 application topically as needed., Disp: 30 g, Rfl: 3 .  lisinopril (ZESTRIL) 20 MG tablet, TAKE ONE TABLET BY MOUTH TWICE DAILY, Disp: 180 tablet, Rfl: 2 .  metFORMIN (GLUCOPHAGE) 1000 MG tablet,  TAKE ONE (1) TABLET BY MOUTH TWO TIMES PER DAY, Disp: 180 tablet, Rfl: 3 .  MICROLET LANCETS MISC, USE AS DIRECTED to check sugars daily E11.319, Disp: 200 each, Rfl: 3 .  pravastatin (PRAVACHOL) 20 MG tablet, TAKE 1 TABLET BY MOUTH EVERY EVENING, Disp: 30 tablet, Rfl: 5 .  tamsulosin (FLOMAX) 0.4 MG CAPS capsule, TAKE 1 CAPSULE BY MOUTH EVERY DAY, Disp: 30 capsule, Rfl: 3 .  traZODone (DESYREL) 50 MG tablet, TAKE 1/2 TO 1 TABLET BY MOUTH AT BEDTIMEAS NEEDED FOR SLEEP, Disp: 30 tablet, Rfl: 0 .  vitamin B-12 (CYANOCOBALAMIN) 1000 MCG tablet, Take 1 tablet (1,000 mcg total) by mouth daily., Disp: 90 tablet, Rfl: 3 .  vitamin E 400 UNIT capsule, Take 200 Units by mouth daily. , Disp: , Rfl:   Current Facility-Administered Medications:  .  Tbo-Filgrastim (GRANIX) injection 480 mcg, 480 mcg, Subcutaneous, Once, [place], Melissa C, MD  Facility-Administered Medications Ordered in Other Visits:  .  heparin lock flush 100 unit/mL, 500 Units, Intravenous, Once, [place], Melissa C, MD .  sodium chloride 0.9 % injection 10 mL, 10 mL, Intravenous, PRN, [name], Melissa C, MD, 10 mL at 03/03/15 0903 .  sodium chloride 0.9 % injection 10 mL, 10 mL, Intracatheter, PRN, [name], Melissa C, MD, 10 mL at 03/10/15 1410 .  sodium chloride flush (NS) 0.9 % injection 10 mL, 10 mL, Intravenous, PRN, [name], Melissa C, MD, 10 mL at 07/23/18 1517  Physical exam:  Vitals:   08/26/19 1422  BP: (!) 175/60  Pulse: 88  Resp: 18  Temp: 98.1 F (36.7 C)   TempSrc: Tympanic  SpO2: 100%  Weight: 222 lb (100.7 kg)   Physical Exam Constitutional:      General: He is not in acute distress. HENT:     Head: Normocephalic and atraumatic.  Eyes:     Pupils: Pupils are equal, round, and reactive to light.  Cardiovascular:     Rate and Rhythm: Normal rate and regular rhythm.     Heart sounds: Normal heart sounds.  Pulmonary:     Effort: Pulmonary effort is normal.     Breath sounds: Normal breath sounds.  Abdominal:     General: Bowel sounds are normal.     Palpations: Abdomen is soft.  Musculoskeletal:     Cervical back: Normal range of motion.  Skin:    General: Skin is warm and dry.  Neurological:     Mental Status: He is alert and oriented to person, place, and time.      CMP Latest Ref Rng & Units 08/26/2019  Glucose 70 - 99 mg/dL 268(H)  BUN 8 - 23 mg/dL 22  Creatinine 0.61 - 1.24 mg/dL 1.53(H)  Sodium 135 - 145 mmol/L 137  Potassium 3.5 - 5.1 mmol/L 4.0  Chloride 98 - 111 mmol/L 107  CO2 22 - 32 mmol/L 24  Calcium 8.9 - 10.3 mg/dL 8.7(L)  Total Protein 6.5 - 8.1 g/dL 7.3  Total Bilirubin 0.3 - 1.2 mg/dL 0.6  Alkaline Phos 38 - 126 U/L 55  AST 15 - 41 U/L 22  ALT 0 - 44 U/L 18   CBC Latest Ref Rng & Units 08/26/2019  WBC 4.0 - 10.5 K/uL 2.9(L)  Hemoglobin 13.0 - 17.0 g/dL 8.4(L)  Hematocrit 39.0 - 52.0 % 25.8(L)  Platelets 150 - 400 K/uL 105(L)     Assessment and plan- Patient is a 85 y.o. male with following issues:   1. Smoldering multiple myeloma- FLC ratio has remained stable around  4-5 over the last 2 years. Myeloma panel pending. Based on his labs, no concern for progression to overt multiple myeloma  2. Anemia- multifactorial secondary to CKD and MDS. He has not responded to epo. awaiting to start luspatercept. He will let us know when he gets the approval for closer minotring here. Goal hb is to keep it >9. Will transfuse next week  I will see him in 3 months- cbc with diff, cmp, myeloma panel and free light  chains   Visit Diagnosis 1. MDS (myelodysplastic syndrome) ([place])   2. Smoldering multiple myeloma ([place])      Dr. Randa Evens, MD, MPH [facility] [phone] 08/29/2019 5:44 PM

## 2019-08-30 ENCOUNTER — Other Ambulatory Visit: Payer: Self-pay | Admitting: Family Medicine

## 2019-08-30 LAB — MULTIPLE MYELOMA PANEL, SERUM
Albumin SerPl Elph-Mcnc: 3.7 g/dL (ref 2.9–4.4)
Albumin/Glob SerPl: 1.2 (ref 0.7–1.7)
Alpha 1: 0.2 g/dL (ref 0.0–0.4)
Alpha2 Glob SerPl Elph-Mcnc: 0.8 g/dL (ref 0.4–1.0)
B-Globulin SerPl Elph-Mcnc: 0.9 g/dL (ref 0.7–1.3)
Gamma Glob SerPl Elph-Mcnc: 1.5 g/dL (ref 0.4–1.8)
Globulin, Total: 3.3 g/dL (ref 2.2–3.9)
IgA: 15 mg/dL — ABNORMAL LOW (ref 61–437)
IgG (Immunoglobin G), Serum: 1602 mg/dL (ref 603–1613)
IgM (Immunoglobulin M), Srm: 21 mg/dL (ref 15–143)
M Protein SerPl Elph-Mcnc: 1.2 g/dL — ABNORMAL HIGH
Total Protein ELP: 7 g/dL (ref 6.0–8.5)

## 2019-08-31 ENCOUNTER — Other Ambulatory Visit: Payer: Self-pay

## 2019-09-01 ENCOUNTER — Inpatient Hospital Stay: Payer: Medicare Other | Admitting: *Deleted

## 2019-09-01 ENCOUNTER — Other Ambulatory Visit: Payer: Self-pay

## 2019-09-01 DIAGNOSIS — D631 Anemia in chronic kidney disease: Secondary | ICD-10-CM | POA: Diagnosis not present

## 2019-09-01 DIAGNOSIS — D709 Neutropenia, unspecified: Secondary | ICD-10-CM | POA: Diagnosis not present

## 2019-09-01 DIAGNOSIS — Z87891 Personal history of nicotine dependence: Secondary | ICD-10-CM | POA: Diagnosis not present

## 2019-09-01 DIAGNOSIS — C9140 Hairy cell leukemia not having achieved remission: Secondary | ICD-10-CM | POA: Diagnosis not present

## 2019-09-01 DIAGNOSIS — E538 Deficiency of other specified B group vitamins: Secondary | ICD-10-CM | POA: Diagnosis not present

## 2019-09-01 DIAGNOSIS — D472 Monoclonal gammopathy: Secondary | ICD-10-CM

## 2019-09-01 DIAGNOSIS — C9000 Multiple myeloma not having achieved remission: Secondary | ICD-10-CM | POA: Diagnosis not present

## 2019-09-01 DIAGNOSIS — N189 Chronic kidney disease, unspecified: Secondary | ICD-10-CM | POA: Diagnosis not present

## 2019-09-01 DIAGNOSIS — Z95828 Presence of other vascular implants and grafts: Secondary | ICD-10-CM

## 2019-09-01 DIAGNOSIS — R5382 Chronic fatigue, unspecified: Secondary | ICD-10-CM | POA: Diagnosis not present

## 2019-09-01 DIAGNOSIS — D469 Myelodysplastic syndrome, unspecified: Secondary | ICD-10-CM

## 2019-09-01 LAB — SAMPLE TO BLOOD BANK

## 2019-09-01 LAB — CBC WITH DIFFERENTIAL/PLATELET
Abs Immature Granulocytes: 0 10*3/uL (ref 0.00–0.07)
Basophils Absolute: 0 10*3/uL (ref 0.0–0.1)
Basophils Relative: 0 %
Eosinophils Absolute: 0.1 10*3/uL (ref 0.0–0.5)
Eosinophils Relative: 3 %
HCT: 25.1 % — ABNORMAL LOW (ref 39.0–52.0)
Hemoglobin: 8.3 g/dL — ABNORMAL LOW (ref 13.0–17.0)
Immature Granulocytes: 0 %
Lymphocytes Relative: 25 %
Lymphs Abs: 0.7 10*3/uL (ref 0.7–4.0)
MCH: 38.1 pg — ABNORMAL HIGH (ref 26.0–34.0)
MCHC: 33.1 g/dL (ref 30.0–36.0)
MCV: 115.1 fL — ABNORMAL HIGH (ref 80.0–100.0)
Monocytes Absolute: 0.2 10*3/uL (ref 0.1–1.0)
Monocytes Relative: 8 %
Neutro Abs: 1.7 10*3/uL (ref 1.7–7.7)
Neutrophils Relative %: 64 %
Platelets: 95 10*3/uL — ABNORMAL LOW (ref 150–400)
RBC: 2.18 MIL/uL — ABNORMAL LOW (ref 4.22–5.81)
RDW: 14.2 % (ref 11.5–15.5)
WBC: 2.6 10*3/uL — ABNORMAL LOW (ref 4.0–10.5)
nRBC: 0 % (ref 0.0–0.2)

## 2019-09-01 MED ORDER — HEPARIN SOD (PORK) LOCK FLUSH 100 UNIT/ML IV SOLN
500.0000 [IU] | Freq: Once | INTRAVENOUS | Status: AC
  Administered 2019-09-01: 500 [IU] via INTRAVENOUS
  Filled 2019-09-01: qty 5

## 2019-09-01 MED ORDER — SODIUM CHLORIDE 0.9% FLUSH
10.0000 mL | Freq: Once | INTRAVENOUS | Status: AC
  Administered 2019-09-01: 10 mL via INTRAVENOUS
  Filled 2019-09-01: qty 10

## 2019-09-03 ENCOUNTER — Inpatient Hospital Stay: Payer: Medicare Other

## 2019-09-03 ENCOUNTER — Other Ambulatory Visit: Payer: Self-pay

## 2019-09-03 DIAGNOSIS — N189 Chronic kidney disease, unspecified: Secondary | ICD-10-CM | POA: Diagnosis not present

## 2019-09-03 DIAGNOSIS — C9140 Hairy cell leukemia not having achieved remission: Secondary | ICD-10-CM | POA: Diagnosis not present

## 2019-09-03 DIAGNOSIS — Z87891 Personal history of nicotine dependence: Secondary | ICD-10-CM | POA: Diagnosis not present

## 2019-09-03 DIAGNOSIS — D649 Anemia, unspecified: Secondary | ICD-10-CM

## 2019-09-03 DIAGNOSIS — D631 Anemia in chronic kidney disease: Secondary | ICD-10-CM | POA: Diagnosis not present

## 2019-09-03 DIAGNOSIS — E538 Deficiency of other specified B group vitamins: Secondary | ICD-10-CM | POA: Diagnosis not present

## 2019-09-03 DIAGNOSIS — R5382 Chronic fatigue, unspecified: Secondary | ICD-10-CM | POA: Diagnosis not present

## 2019-09-03 DIAGNOSIS — D709 Neutropenia, unspecified: Secondary | ICD-10-CM | POA: Diagnosis not present

## 2019-09-03 DIAGNOSIS — C9000 Multiple myeloma not having achieved remission: Secondary | ICD-10-CM | POA: Diagnosis not present

## 2019-09-03 LAB — PREPARE RBC (CROSSMATCH)

## 2019-09-03 MED ORDER — ACETAMINOPHEN 325 MG PO TABS
650.0000 mg | ORAL_TABLET | Freq: Once | ORAL | Status: AC
  Administered 2019-09-03: 650 mg via ORAL

## 2019-09-03 MED ORDER — ACETAMINOPHEN 325 MG PO TABS
ORAL_TABLET | ORAL | Status: AC
  Filled 2019-09-03: qty 2

## 2019-09-03 MED ORDER — HEPARIN SOD (PORK) LOCK FLUSH 100 UNIT/ML IV SOLN
INTRAVENOUS | Status: AC
  Filled 2019-09-03: qty 5

## 2019-09-03 MED ORDER — HEPARIN SOD (PORK) LOCK FLUSH 100 UNIT/ML IV SOLN
500.0000 [IU] | Freq: Every day | INTRAVENOUS | Status: AC | PRN
  Administered 2019-09-03: 500 [IU]
  Filled 2019-09-03: qty 5

## 2019-09-03 MED ORDER — SODIUM CHLORIDE 0.9% FLUSH
10.0000 mL | INTRAVENOUS | Status: AC | PRN
  Administered 2019-09-03: 10 mL
  Filled 2019-09-03: qty 10

## 2019-09-03 MED ORDER — SODIUM CHLORIDE 0.9% IV SOLUTION
250.0000 mL | Freq: Once | INTRAVENOUS | Status: AC
  Administered 2019-09-03: 250 mL via INTRAVENOUS
  Filled 2019-09-03: qty 250

## 2019-09-04 LAB — TYPE AND SCREEN
ABO/RH(D): A NEG
Antibody Screen: NEGATIVE
Unit division: 0

## 2019-09-04 LAB — BPAM RBC
Blood Product Expiration Date: 202101160803
ISSUE DATE / TIME: 202101151141
Unit Type and Rh: 600

## 2019-09-06 DIAGNOSIS — E785 Hyperlipidemia, unspecified: Secondary | ICD-10-CM | POA: Diagnosis not present

## 2019-09-06 DIAGNOSIS — I10 Essential (primary) hypertension: Secondary | ICD-10-CM | POA: Diagnosis not present

## 2019-09-06 DIAGNOSIS — I129 Hypertensive chronic kidney disease with stage 1 through stage 4 chronic kidney disease, or unspecified chronic kidney disease: Secondary | ICD-10-CM | POA: Diagnosis not present

## 2019-09-06 DIAGNOSIS — E1122 Type 2 diabetes mellitus with diabetic chronic kidney disease: Secondary | ICD-10-CM | POA: Diagnosis not present

## 2019-09-06 DIAGNOSIS — N1831 Chronic kidney disease, stage 3a: Secondary | ICD-10-CM | POA: Diagnosis not present

## 2019-09-06 DIAGNOSIS — N183 Chronic kidney disease, stage 3 unspecified: Secondary | ICD-10-CM | POA: Diagnosis not present

## 2019-09-10 ENCOUNTER — Ambulatory Visit: Payer: Medicare Other | Admitting: Family Medicine

## 2019-09-10 ENCOUNTER — Other Ambulatory Visit: Admit: 2019-09-10 | Discharge: 2019-09-11 | Payer: MEDICARE

## 2019-09-10 ENCOUNTER — Encounter: Admit: 2019-09-10 | Discharge: 2019-09-11 | Payer: MEDICARE

## 2019-09-10 DIAGNOSIS — D4620 Refractory anemia with excess of blasts, unspecified: Principal | ICD-10-CM

## 2019-09-17 ENCOUNTER — Telehealth: Payer: Self-pay | Admitting: Family Medicine

## 2019-09-17 NOTE — Telephone Encounter (Signed)
Patient cancelled 6 months f/u appointment that was scheduled for this month. Please reschedule with patient when able to

## 2019-09-20 NOTE — Telephone Encounter (Signed)
Left voicemail for patient to call the office. Need to schedule 6 month follow up.

## 2019-09-27 NOTE — Telephone Encounter (Signed)
Left voicemail. Patient needs to still schedule 6 month follow up.

## 2019-10-01 ENCOUNTER — Encounter: Admit: 2019-10-01 | Discharge: 2019-10-02 | Payer: MEDICARE

## 2019-10-01 ENCOUNTER — Ambulatory Visit: Admit: 2019-10-01 | Discharge: 2019-10-02 | Payer: MEDICARE

## 2019-10-01 DIAGNOSIS — D4620 Refractory anemia with excess of blasts, unspecified: Principal | ICD-10-CM

## 2019-10-04 ENCOUNTER — Telehealth: Payer: Self-pay | Admitting: *Deleted

## 2019-10-04 NOTE — Telephone Encounter (Addendum)
Patient called stating that he has been going to [facility] and getting an injection there, but he wants to come to [facility] to get it. It looks like he has been getting LUSPATERCEPT-AAMT 0.25MG injection.  Our infusion pharmacy does not have this in stock per [facility], but he said if patient returns to Dr Janese Banks and she orders it and we can get insurance approval,it can be ordered. Please advise

## 2019-10-04 NOTE — Telephone Encounter (Signed)
I messaged Dr. Evelene Croon and waiting to hear back from her. Once she is ok continuing his injections here, I will take over

## 2019-10-04 NOTE — Telephone Encounter (Signed)
Patient informed that we are waiting on a response from Dr Evelene Croon at [facility]. He said that he has a card to pay for the injections

## 2019-10-05 ENCOUNTER — Telehealth: Payer: Self-pay | Admitting: Family Medicine

## 2019-10-05 NOTE — Progress Notes (Signed)
  Chronic Care Management   Outreach Note  10/05/2019 Name: Alex Smith MRN: [id number] DOB: 1935/05/30  Referred by: Ria Bush, MD Reason for referral : No chief complaint on file.   An unsuccessful telephone outreach was attempted today. The patient was referred to the pharmacist for assistance with care management and care coordination.   Follow Up Plan:   Raynicia Dukes [employer] Scheduler

## 2019-10-07 NOTE — Telephone Encounter (Signed)
Patient has called again today asking about transferring care to [facility] for his injections

## 2019-10-08 ENCOUNTER — Other Ambulatory Visit: Payer: Self-pay | Admitting: Oncology

## 2019-10-08 NOTE — Telephone Encounter (Signed)
I called pt and let him know that I have reached out to [facility] to get specifics about if the grant can trf to our facility. If the drug comes from a drug company or if [facility] provides and we will need to order it. We have made an appt 3/5 and have labs and inj. Dr Janese Banks put order in for it. If something comes up an we are not able to get it to pt. We will call and let him know. He is ok with above. He did state he has voucher card and he has to give it to staff with every shot.

## 2019-10-08 NOTE — Telephone Encounter (Signed)
Per patient, his next injection is due 10/22/19 and is given every 3 weeks. The order will have to be entered so that pharmacy can order the drug to have in stock for when the patient comes in. I told him that we will call him back with appointment date/time.

## 2019-10-08 NOTE — Telephone Encounter (Signed)
Just heard back from dr. Evelene Croon. Ok to take over luspatercept. He has a Radio producer for this through [facility] and apparently will remain active. Need to find out how this can be transferred and when is his next dose. I will see him that day with cbc with diff and cmp

## 2019-10-15 ENCOUNTER — Telehealth: Payer: Self-pay

## 2019-10-15 NOTE — Telephone Encounter (Signed)
Patient called you back. Please return his call when you have a chance.

## 2019-10-15 NOTE — Telephone Encounter (Signed)
Patient returned your call and he stated that he had spoken to Dr. Baruch Gouty this AM so there might not necessary to call him back. However, if you needed to tell him something else, to call him back at [phone].

## 2019-10-18 ENCOUNTER — Other Ambulatory Visit: Payer: Self-pay | Admitting: Oncology

## 2019-10-18 ENCOUNTER — Telehealth: Payer: Self-pay | Admitting: *Deleted

## 2019-10-18 DIAGNOSIS — I129 Hypertensive chronic kidney disease with stage 1 through stage 4 chronic kidney disease, or unspecified chronic kidney disease: Secondary | ICD-10-CM | POA: Diagnosis not present

## 2019-10-18 DIAGNOSIS — E1122 Type 2 diabetes mellitus with diabetic chronic kidney disease: Secondary | ICD-10-CM | POA: Diagnosis not present

## 2019-10-18 DIAGNOSIS — Z794 Long term (current) use of insulin: Secondary | ICD-10-CM | POA: Diagnosis not present

## 2019-10-18 DIAGNOSIS — N189 Chronic kidney disease, unspecified: Secondary | ICD-10-CM | POA: Diagnosis not present

## 2019-10-18 DIAGNOSIS — G4700 Insomnia, unspecified: Secondary | ICD-10-CM | POA: Diagnosis not present

## 2019-10-18 NOTE — Telephone Encounter (Signed)
Called to say that we still do not have the drug approved or any in stock. Crystal has reached out to [facility] about sending in bills for the previous injections to his grant with leukemia and [place]. I need him to get injection on 3/5 at [facility] and hopefully we will have it ready for the future on 3/26. All this was left on voicemail because he was not answering

## 2019-10-19 ENCOUNTER — Telehealth: Payer: Self-pay

## 2019-10-19 NOTE — Telephone Encounter (Signed)
Jim-patient's son called wanting a call back so you could explain what is going on with his medication since he was not able to explain. Thanks.

## 2019-10-19 NOTE — Telephone Encounter (Signed)
Judeen Hammans called Clair Gulling and explained what he was wanting to know about his dad's medication.

## 2019-10-22 ENCOUNTER — Inpatient Hospital Stay: Payer: Medicare Other

## 2019-10-25 ENCOUNTER — Encounter: Admit: 2019-10-25 | Discharge: 2019-10-26 | Payer: MEDICARE

## 2019-10-25 ENCOUNTER — Ambulatory Visit: Admit: 2019-10-25 | Discharge: 2019-10-26 | Payer: MEDICARE

## 2019-10-25 DIAGNOSIS — D4620 Refractory anemia with excess of blasts, unspecified: Secondary | ICD-10-CM | POA: Diagnosis not present

## 2019-10-28 ENCOUNTER — Other Ambulatory Visit: Payer: Self-pay | Admitting: Family Medicine

## 2019-11-01 ENCOUNTER — Telehealth: Payer: Self-pay | Admitting: *Deleted

## 2019-11-01 NOTE — Telephone Encounter (Signed)
Alex Smith called about his luspatercept inj. He had it last 10/25/19 at [facility] and explained to him that we are good to give inj. But the grant that helps cover it as of today nothing has been submitted. I will ask crystal to call [facility] tom. And let pt know

## 2019-11-02 ENCOUNTER — Telehealth: Payer: Self-pay | Admitting: *Deleted

## 2019-11-02 NOTE — Telephone Encounter (Signed)
Crystal called [facility] an left message for the person who helps with sending bills to [employer]. She did not hear back from that person.I called pt and let him know that we have not heard back but will keep trying

## 2019-11-04 ENCOUNTER — Telehealth: Payer: Self-pay | Admitting: Family Medicine

## 2019-11-04 NOTE — Progress Notes (Signed)
Patient scheduled a call back later today. Patient was on the way to take wife to the dentist asked me to call back later.     Alex Smith [employer] Scheduler

## 2019-11-05 ENCOUNTER — Telehealth: Payer: Self-pay | Admitting: *Deleted

## 2019-11-05 NOTE — Telephone Encounter (Signed)
I called to see if pt claim was rcvd. I spoke to [facility] and she states she sees the fax I just sent but they did receive the request from [facility]  And paid the amount 2994.49 to [facility] for the 09/10/19 claim. I told her to ignore the claim we sent. We were having trouble trying to get [facility] to submit so when pt got the bill for 1st injection we were going to send claim in for him. But since the grant sent check -they can disregard the claim we sent today on his behalf. Serene noted it in his chart.i have called pt and left him a message that [facility] submitted the bill and they cut the check yest. We will order his next inj which will be on 3/29 WITH LABS and see md and inj.-luspatercept

## 2019-11-08 ENCOUNTER — Other Ambulatory Visit: Payer: Self-pay | Admitting: Family Medicine

## 2019-11-14 ENCOUNTER — Other Ambulatory Visit: Payer: Self-pay | Admitting: *Deleted

## 2019-11-14 DIAGNOSIS — D469 Myelodysplastic syndrome, unspecified: Secondary | ICD-10-CM

## 2019-11-15 ENCOUNTER — Encounter: Payer: Self-pay | Admitting: Oncology

## 2019-11-15 ENCOUNTER — Other Ambulatory Visit: Payer: Self-pay

## 2019-11-15 ENCOUNTER — Inpatient Hospital Stay: Payer: Medicare Other

## 2019-11-15 ENCOUNTER — Inpatient Hospital Stay (HOSPITAL_BASED_OUTPATIENT_CLINIC_OR_DEPARTMENT_OTHER): Payer: Medicare Other | Admitting: Oncology

## 2019-11-15 ENCOUNTER — Inpatient Hospital Stay: Payer: Medicare Other | Attending: Oncology

## 2019-11-15 VITALS — BP 154/73 | HR 85 | Temp 98.7°F | Wt 220.0 lb

## 2019-11-15 DIAGNOSIS — D469 Myelodysplastic syndrome, unspecified: Secondary | ICD-10-CM | POA: Insufficient documentation

## 2019-11-15 DIAGNOSIS — C9000 Multiple myeloma not having achieved remission: Secondary | ICD-10-CM

## 2019-11-15 DIAGNOSIS — D472 Monoclonal gammopathy: Secondary | ICD-10-CM

## 2019-11-15 DIAGNOSIS — N189 Chronic kidney disease, unspecified: Secondary | ICD-10-CM | POA: Diagnosis not present

## 2019-11-15 DIAGNOSIS — D630 Anemia in neoplastic disease: Secondary | ICD-10-CM | POA: Diagnosis not present

## 2019-11-15 DIAGNOSIS — D631 Anemia in chronic kidney disease: Secondary | ICD-10-CM | POA: Diagnosis not present

## 2019-11-15 DIAGNOSIS — E538 Deficiency of other specified B group vitamins: Secondary | ICD-10-CM | POA: Diagnosis not present

## 2019-11-15 DIAGNOSIS — Z79899 Other long term (current) drug therapy: Secondary | ICD-10-CM | POA: Diagnosis not present

## 2019-11-15 DIAGNOSIS — D46Z Other myelodysplastic syndromes: Secondary | ICD-10-CM

## 2019-11-15 LAB — COMPREHENSIVE METABOLIC PANEL
ALT: 15 U/L (ref 0–44)
AST: 19 U/L (ref 15–41)
Albumin: 3.9 g/dL (ref 3.5–5.0)
Alkaline Phosphatase: 63 U/L (ref 38–126)
Anion gap: 6 (ref 5–15)
BUN: 28 mg/dL — ABNORMAL HIGH (ref 8–23)
CO2: 25 mmol/L (ref 22–32)
Calcium: 8.5 mg/dL — ABNORMAL LOW (ref 8.9–10.3)
Chloride: 103 mmol/L (ref 98–111)
Creatinine, Ser: 1.81 mg/dL — ABNORMAL HIGH (ref 0.61–1.24)
GFR calc Af Amer: 39 mL/min — ABNORMAL LOW (ref >60)
GFR calc non Af Amer: 34 mL/min — ABNORMAL LOW (ref >60)
Glucose, Bld: 333 mg/dL — ABNORMAL HIGH (ref 70–99)
Potassium: 4.2 mmol/L (ref 3.5–5.1)
Sodium: 134 mmol/L — ABNORMAL LOW (ref 135–145)
Total Bilirubin: 0.6 mg/dL (ref 0.3–1.2)
Total Protein: 7.7 g/dL (ref 6.5–8.1)

## 2019-11-15 LAB — CBC WITH DIFFERENTIAL/PLATELET
Abs Immature Granulocytes: 0.01 10*3/uL (ref 0.00–0.07)
Basophils Absolute: 0 10*3/uL (ref 0.0–0.1)
Basophils Relative: 0 %
Eosinophils Absolute: 0.1 10*3/uL (ref 0.0–0.5)
Eosinophils Relative: 2 %
HCT: 30.1 % — ABNORMAL LOW (ref 39.0–52.0)
Hemoglobin: 10.1 g/dL — ABNORMAL LOW (ref 13.0–17.0)
Immature Granulocytes: 0 %
Lymphocytes Relative: 29 %
Lymphs Abs: 1 10*3/uL (ref 0.7–4.0)
MCH: 37 pg — ABNORMAL HIGH (ref 26.0–34.0)
MCHC: 33.6 g/dL (ref 30.0–36.0)
MCV: 110.3 fL — ABNORMAL HIGH (ref 80.0–100.0)
Monocytes Absolute: 0.2 10*3/uL (ref 0.1–1.0)
Monocytes Relative: 7 %
Neutro Abs: 2.1 10*3/uL (ref 1.7–7.7)
Neutrophils Relative %: 62 %
Platelets: 114 10*3/uL — ABNORMAL LOW (ref 150–400)
RBC: 2.73 MIL/uL — ABNORMAL LOW (ref 4.22–5.81)
RDW: 14.9 % (ref 11.5–15.5)
WBC: 3.4 10*3/uL — ABNORMAL LOW (ref 4.0–10.5)
nRBC: 0 % (ref 0.0–0.2)

## 2019-11-15 MED ORDER — LUSPATERCEPT-AAMT 75 MG ~~LOC~~ SOLR
100.0000 mg | SUBCUTANEOUS | Status: AC
  Filled 2019-11-15: qty 2

## 2019-11-15 NOTE — Progress Notes (Signed)
Patient stated that he had been doing well with no complaints. 

## 2019-11-16 ENCOUNTER — Inpatient Hospital Stay: Payer: Medicare Other

## 2019-11-17 ENCOUNTER — Inpatient Hospital Stay: Payer: Medicare Other

## 2019-11-17 ENCOUNTER — Telehealth: Payer: Self-pay | Admitting: *Deleted

## 2019-11-17 NOTE — Telephone Encounter (Signed)
I have called pt twice today about if the medication has come in for him to get his luspatercept inj. She said this am it was not here and then sent me a message at lunch and then I checked with her again this afternoon. I told pt we will not give him an appt until we get drug. That keep him from coming over when we do  Not have drug yet. He is agreeable for this and we ill contact him to set up appt when we know the drug is here. Pt agreeable

## 2019-11-18 ENCOUNTER — Other Ambulatory Visit: Payer: Self-pay

## 2019-11-18 ENCOUNTER — Inpatient Hospital Stay: Payer: Medicare Other | Attending: Oncology

## 2019-11-18 DIAGNOSIS — D469 Myelodysplastic syndrome, unspecified: Secondary | ICD-10-CM | POA: Diagnosis not present

## 2019-11-18 DIAGNOSIS — D46Z Other myelodysplastic syndromes: Secondary | ICD-10-CM

## 2019-11-18 MED ORDER — LUSPATERCEPT-AAMT 75 MG ~~LOC~~ SOLR
100.0000 mg | SUBCUTANEOUS | Status: AC
  Administered 2019-11-18: 100 mg via SUBCUTANEOUS
  Filled 2019-11-18: qty 2

## 2019-11-18 NOTE — Progress Notes (Signed)
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 [name] and is transitioning his care to me. He has following issues:  1.Hairy cell leukemia that was initially diagnosed on bone marrow biopsy in June 2016. Bone marrow biopsy at that time showed extensive marrow involvement with recurrent hairy cell leukemia approximately 80% of the cells in the core 10% monoclonal plasma cell infiltrate compatible with plasma cell neoplasm. Peripheral smear revealed leukopenia with a white count of 1900 compatible with hairy cell leukemia.FISH studies revealed an abnormal myeloma panel with CCND1/IGH translocation t (11;14) and loss of MAF/16q. FISH studies for MDS were negative. Cytogenetics were normal (46, XY).He has last received cladribine for this back in July 2016  2.Smoldering multiple myeloma: Bone marrow biopsy in March 2017 revealed persistent plasma cell neoplasm with monoclonal plasma cells estimated at 20 to 30%. No morphologic evidence of residual hairy cell leukemia.Marrow was normocellular for age with relative erythroid hyperplasia, relative myeloid hypoplasia, mild dyspoiesis, adequate megakaryocytes and no increased blasts. There was no significant  increase in marrow reticulin fibers. Iron was present. FISH studies for MDS were negative. FISH panel for myeloma revealed CCND1/IGH translocation- t(11;14) and loss of MAF/16q. Cytogenetics were normal (46, XY).Last bone marrow biopsy was in October 2018 which showed variably cellular marrow 10 to 50% with kappa restricted plasmacytosis of 10 to 15%. No evidence of leukemia lymphoma or high-grade dysplasia. Normal cytogenetics. He has also been followed by Dr. [name] at [facility]. He has received Revlimid and Decadron in the past in 2017 which was subsequently stopped. PET scan in July 2017 showed no hypermetabolic adenopathy or focal skeletal lesions.  3.Probable MDS: He has received a trial of Procrit in the past in 2017 but apparently did not respond to it. He has been getting periodic blood transfusions to maintain his hemoglobin closer to 9 as he is symptomatic when his hemoglobin is less than 9. He has been getting about 2-3 blood transfusions during the year. He does have some mild leukopenia/neutropenia and is on acyclovir prophylaxis. He gets weekly G-CSF when his ANC is less than 1.5.  4.Also has history of B12 deficiency for which he is on oral B12.   Interval history-patient reports improvement in his fatigue after receiving Luspatercept.  He has received 2 shots so far through [facility].  His hemoglobin improved from 8-10.  He denies other complaints at this time  ECOG PS- 1 Pain scale- 0 Opioid associated constipation- no  Review of systems- Review of Systems  Constitutional: Positive for malaise/fatigue. Negative for chills, fever and weight loss.  HENT: Negative for congestion, ear discharge and nosebleeds.   Eyes: Negative for blurred vision.  Respiratory: Negative for cough, hemoptysis, sputum production, shortness of breath and wheezing.   Cardiovascular: Negative for chest pain, palpitations, orthopnea and claudication.  Gastrointestinal: Negative for  abdominal pain, blood in stool, constipation, diarrhea,  heartburn, melena, nausea and vomiting.  Genitourinary: Negative for dysuria, flank pain, frequency, hematuria and urgency.  Musculoskeletal: Negative for back pain, joint pain and myalgias.  Skin: Negative for rash.  Neurological: Negative for dizziness, tingling, focal weakness, seizures, weakness and headaches.  Endo/Heme/Allergies: Does not bruise/bleed easily.  Psychiatric/Behavioral: Negative for depression and suicidal ideas. The patient does not have insomnia.        Allergies  Allergen Reactions  . Rituximab Rash    Chest tightness Chest tightness  . Blood-Group Specific Substance Other (See Comments)    Had a post transfusion reaction of red blood cells; NOW REQUIRES WASHED BLOOD CELLS Had a post transfusion reaction of red blood cells; NOW REQUIRES WASHED BLOOD CELLS  . Primaxin [Imipenem] Other (See Comments)    Possible allergy Possible allergy  . Voriconazole Other (See Comments)  . Sulfa Antibiotics Itching and Rash  . Sulfacetamide Sodium Itching and Rash     Past Medical History:  Diagnosis Date  . Anemia   . B12 deficiency   . Basal cell carcinoma of face   . BPH (benign prostatic hypertrophy)    followed by urology, discharged (Dr. Bernardo Heater)  . CAP (community acquired pneumonia) 02/15/2015  . CKD stage 3 due to type 2 diabetes mellitus ([place]) 11/14/2017  . Colon polyps   . Diverticulosis   . GERD (gastroesophageal reflux disease)   . Hairy cell leukemia ([place]) 2006   recurrent, seizure on rituxan, now on cladribine ([place])  . History of pneumonia 2000's   "once" (07/07/2012)  . History of shingles   . HLD (hyperlipidemia)   . Hypertension   . Pneumonia   . Shortness of breath dyspnea   . Systolic murmur 51/09/5850  . Type 2 diabetes, controlled, with retinopathy ([place])      Past Surgical History:  Procedure Laterality Date  . [place] VITRECTOMY WITH 20 GAUGE MVR PORT FOR MACULAR  HOLE  07/07/2012   Procedure: 25 GAUGE PARS PLANA VITRECTOMY WITH 20 GAUGE MVR PORT FOR MACULAR HOLE;  Surgeon: Hayden Pedro, MD;  Location: [place];  Service: Ophthalmology;  Laterality: Left;  . BONE MARROW BIOPSY  2016  . CARDIOVASCULAR STRESS TEST  2013   treadmill - no evidence ischemia, EF 61%  . CATARACT EXTRACTION W/ INTRAOCULAR LENS  IMPLANT, BILATERAL  ~ 2010  . COLONOSCOPY  2014   [place] WNL no rpt needed, h/o polyps  . EYE SURGERY Left 06/2012   laser surgery  . GAS INSERTION  07/07/2012   Procedure: INSERTION OF GAS;  Surgeon: Hayden Pedro, MD;  Location: [place];  Service: Ophthalmology;  Laterality: Left;  C3F8  . PERIPHERAL VASCULAR CATHETERIZATION N/A 02/23/2015   Procedure: Glori Luis Cath Insertion;  Surgeon: Algernon Huxley, MD;  Location: [place] CV [facility];  Service: Cardiovascular;  Laterality: N/A;  . SERUM PATCH  07/07/2012   Procedure: SERUM PATCH;  Surgeon: Hayden Pedro, MD;  Location: [place];  Service: Ophthalmology;  Laterality: Left;  . SKIN CANCER EXCISION     "all over my face" (07/07/2012)    Social History   Socioeconomic History  . Marital status: Married    Spouse name: Not on file  . Number of children: Not on file  . Years of education: Not on file  . Highest education level: Not on file  Occupational History  . Not on file  Tobacco Use  . Smoking status: Former Smoker    Packs/day: 2.00    Years: 25.00  Pack years: 50.00    Types: Cigarettes    Quit date: 02/06/1969    Years since quitting: 50.8  . Smokeless tobacco: Never Used  . Tobacco comment: 07/07/2012 "stopped smoking ~ 40 yr ago; smoked 20-69yr  Substance and Sexual Activity  . Alcohol use: Yes    Alcohol/week: 0.0 standard drinks    Comment: rare  . Drug use: No  . Sexual activity: Never  Other Topics Concern  . Not on file  Social History Narrative   Married >50 yrs, cares for wife. 1 cat   Occupation: was cArt gallery manager works part time for [employer]   Activity:  likes to golf   Diet: moderate water, fruits/vegetables daily   Social Determinants of Health   Financial Resource Strain:   . Difficulty of Paying Living Expenses:   Food Insecurity:   . Worried About RCharity fundraiserin the Last Year:   . RArboriculturistin the Last Year:   Transportation Needs:   . LFilm/video editor(Medical):   .Marland KitchenLack of Transportation (Non-Medical):   Physical Activity:   . Days of Exercise per Week:   . Minutes of Exercise per Session:   Stress:   . Feeling of Stress :   Social Connections:   . Frequency of Communication with Friends and Family:   . Frequency of Social Gatherings with Friends and Family:   . Attends Religious Services:   . Active Member of Clubs or Organizations:   . Attends CArchivistMeetings:   .Marland KitchenMarital Status:   Intimate Partner Violence:   . Fear of Current or Ex-Partner:   . Emotionally Abused:   .Marland KitchenPhysically Abused:   . Sexually Abused:     Family History  Problem Relation Age of Onset  . Dementia Mother   . Heart failure Father 780 . Cancer Sister        breast  . Diabetes Paternal Uncle   . Diabetes Paternal Aunt   . CAD Brother 468      MI  . Stroke Neg Hx      Current Outpatient Medications:  .  aspirin EC 81 MG tablet, Take 81 mg by mouth daily., Disp: , Rfl:  .  DIPHENHYDRAMINE-ACETAMINOPHEN PO, Take 1 tablet by mouth at bedtime., Disp: , Rfl:  .  doxazosin (CARDURA) 1 MG tablet, TAKE ONE TABLET BY MOUTH EVERY DAY, Disp: 30 tablet, Rfl: 5 .  glucose blood (ACCU-CHEK AVIVA PLUS) test strip, USE TO CHECK SUGAR DAILY E11.319, Disp: 200 each, Rfl: 3 .  HUMULIN N 100 UNIT/ML injection, INJECT 39 UNITS SUBCUTANEOUSLY TWICE DAILY BEFORE A MEAL, Disp: 20 mL, Rfl: 5 .  HUMULIN R 100 UNIT/ML injection, INJECT 10 UNITS INTO THE SKIN TWICE DAILY WITH MEALS, Disp: 10 mL, Rfl: 5 .  hydrochlorothiazide (HYDRODIURIL) 25 MG tablet, TAKE 1 TABLET BY MOUTH DAILY, Disp: 90 tablet, Rfl: 1 .  lidocaine-prilocaine  (EMLA) cream, Apply 1 application topically as needed., Disp: 30 g, Rfl: 3 .  lisinopril (ZESTRIL) 20 MG tablet, TAKE ONE TABLET BY MOUTH TWICE DAILY, Disp: 180 tablet, Rfl: 2 .  metFORMIN (GLUCOPHAGE) 1000 MG tablet, TAKE ONE (1) TABLET BY MOUTH TWO TIMES PER DAY, Disp: 180 tablet, Rfl: 3 .  Microlet Lancets MISC, USE AS DIRECTED, Disp: 200 each, Rfl: 3 .  omeprazole (PRILOSEC) 20 MG capsule, Take 1 capsule by mouth 1 day or 1 dose., Disp: , Rfl:  .  pravastatin (PRAVACHOL) 20 MG  tablet, TAKE ONE TABLET EVERY EVENING, Disp: 30 tablet, Rfl: 5 .  tamsulosin (FLOMAX) 0.4 MG CAPS capsule, TAKE 1 CAPSULE BY MOUTH EVERY DAY, Disp: 30 capsule, Rfl: 3 .  temazepam (RESTORIL) 7.5 MG capsule, Take 1 capsule by mouth daily., Disp: , Rfl:  .  traZODone (DESYREL) 50 MG tablet, TAKE 1/2 TO 1 TABLET BY MOUTH AT BEDTIMEAS NEEDED FOR SLEEP, Disp: 30 tablet, Rfl: 0 .  vitamin B-12 (CYANOCOBALAMIN) 1000 MCG tablet, Take 1 tablet (1,000 mcg total) by mouth daily., Disp: 90 tablet, Rfl: 3 .  vitamin E 400 UNIT capsule, Take 200 Units by mouth daily. , Disp: , Rfl:  .  acyclovir (ZOVIRAX) 400 MG tablet, TAKE TWO TABLETS TWICE DAILY (Patient not taking: Reported on 11/15/2019), Disp: 120 tablet, Rfl: 3  Current Facility-Administered Medications:  .  Tbo-Filgrastim (GRANIX) injection 480 mcg, 480 mcg, Subcutaneous, Once, [place], Melissa C, MD  Facility-Administered Medications Ordered in Other Visits:  .  heparin lock flush 100 unit/mL, 500 Units, Intravenous, Once, [place], Melissa C, MD .  luspatercept-aamt (REBLOZYL) subcutaneous injection 100 mg, 100 mg, Subcutaneous, Q30 days, Randa Evens C, MD .  sodium chloride 0.9 % injection 10 mL, 10 mL, Intravenous, PRN, [name], Melissa C, MD, 10 mL at 03/03/15 0903 .  sodium chloride 0.9 % injection 10 mL, 10 mL, Intracatheter, PRN, [place], Melissa C, MD, 10 mL at 03/10/15 1410 .  sodium chloride flush (NS) 0.9 % injection 10 mL, 10 mL, Intravenous, PRN, [name],  Melissa C, MD, 10 mL at 07/23/18 1517  Physical exam:  Vitals:   11/15/19 1403  BP: (!) 154/73  Pulse: 85  Temp: 98.7 F (37.1 C)  Weight: 220 lb (99.8 kg)   Physical Exam HENT:     Head: Normocephalic and atraumatic.  Cardiovascular:     Rate and Rhythm: Normal rate and regular rhythm.     Heart sounds: Normal heart sounds.  Pulmonary:     Effort: Pulmonary effort is normal.     Breath sounds: Normal breath sounds.  Abdominal:     General: Bowel sounds are normal.     Palpations: Abdomen is soft.  Skin:    General: Skin is warm and dry.  Neurological:     Mental Status: He is alert and oriented to person, place, and time.      CMP Latest Ref Rng & Units 11/15/2019  Glucose 70 - 99 mg/dL 333(H)  BUN 8 - 23 mg/dL 28(H)  Creatinine 0.61 - 1.24 mg/dL 1.81(H)  Sodium 135 - 145 mmol/L 134(L)  Potassium 3.5 - 5.1 mmol/L 4.2  Chloride 98 - 111 mmol/L 103  CO2 22 - 32 mmol/L 25  Calcium 8.9 - 10.3 mg/dL 8.5(L)  Total Protein 6.5 - 8.1 g/dL 7.7  Total Bilirubin 0.3 - 1.2 mg/dL 0.6  Alkaline Phos 38 - 126 U/L 63  AST 15 - 41 U/L 19  ALT 0 - 44 U/L 15   CBC Latest Ref Rng & Units 11/15/2019  WBC 4.0 - 10.5 K/uL 3.4(L)  Hemoglobin 13.0 - 17.0 g/dL 10.1(L)  Hematocrit 39.0 - 52.0 % 30.1(L)  Platelets 150 - 400 K/uL 114(L)      Assessment and plan- Patient is a 85 y.o. male who is here for following issues:  1.  MDS: His baseline hemoglobin was around 8 and after starting Luspatercept has improved to 10.  He will continue to get Luspatercept every 3 weeks.  However his grant money which has been approved is about $7000.  Each injection of Luspatercept after insurance coverage discussed involves an out-of-pocket cost of about $2500.  This means he will only be able to receive 2-3 shots without out-of-pocket expenditure.  Patient does not have enough financial resources to do this without a grant.  There are also no other grants in place which can cover his out-of-pocket cost.   We do not have the full dose of Luspatercept available today and we will therefore bring him back in 2 days time for his third dose.  He is supposed to be getting the shots every 3 weeks but the financial aspect of it is still uncertain.  I will see him back in 12 weeks with a CBC with differential and CMP.  2.  Smoldering multiple myeloma: We will check myeloma panel and serum free light chains in 12 weeks.  3.  Anemia: Multifactorial secondary to CKD and MDS.  He has not responded to EPO in the past and is currently on Luspatercept.  Continue to monitor   Visit Diagnosis 1. MDS (myelodysplastic syndrome) ([place])   2. High risk medication use   3. Smoldering multiple myeloma ([place])      Dr. Randa Evens, MD, MPH [facility] [phone] 11/18/2019 9:11 AM

## 2019-11-23 ENCOUNTER — Other Ambulatory Visit: Payer: Medicare Other

## 2019-11-23 ENCOUNTER — Ambulatory Visit: Payer: Medicare Other | Admitting: Oncology

## 2019-11-25 ENCOUNTER — Ambulatory Visit: Payer: Medicare Other

## 2019-11-29 ENCOUNTER — Encounter: Admit: 2019-11-29 | Discharge: 2019-11-30 | Payer: MEDICARE

## 2019-11-29 ENCOUNTER — Ambulatory Visit: Admit: 2019-11-29 | Discharge: 2019-11-30 | Payer: MEDICARE | Attending: Internal Medicine | Primary: Internal Medicine

## 2019-11-29 DIAGNOSIS — C9000 Multiple myeloma not having achieved remission: Principal | ICD-10-CM

## 2019-11-29 DIAGNOSIS — D4620 Refractory anemia with excess of blasts, unspecified: Principal | ICD-10-CM

## 2019-11-29 DIAGNOSIS — C9141 Hairy cell leukemia, in remission: Principal | ICD-10-CM

## 2019-11-29 DIAGNOSIS — D696 Thrombocytopenia, unspecified: Secondary | ICD-10-CM | POA: Diagnosis not present

## 2019-11-29 DIAGNOSIS — N189 Chronic kidney disease, unspecified: Secondary | ICD-10-CM | POA: Diagnosis not present

## 2019-11-29 DIAGNOSIS — Z794 Long term (current) use of insulin: Secondary | ICD-10-CM | POA: Diagnosis not present

## 2019-11-29 DIAGNOSIS — E7800 Pure hypercholesterolemia, unspecified: Secondary | ICD-10-CM | POA: Diagnosis not present

## 2019-11-29 DIAGNOSIS — Z7982 Long term (current) use of aspirin: Secondary | ICD-10-CM | POA: Diagnosis not present

## 2019-11-29 DIAGNOSIS — I129 Hypertensive chronic kidney disease with stage 1 through stage 4 chronic kidney disease, or unspecified chronic kidney disease: Secondary | ICD-10-CM | POA: Diagnosis not present

## 2019-11-29 DIAGNOSIS — E1122 Type 2 diabetes mellitus with diabetic chronic kidney disease: Secondary | ICD-10-CM | POA: Diagnosis not present

## 2019-11-29 DIAGNOSIS — Z79899 Other long term (current) drug therapy: Secondary | ICD-10-CM | POA: Diagnosis not present

## 2019-11-29 DIAGNOSIS — D619 Aplastic anemia, unspecified: Secondary | ICD-10-CM | POA: Diagnosis not present

## 2019-11-29 DIAGNOSIS — Z87891 Personal history of nicotine dependence: Secondary | ICD-10-CM | POA: Diagnosis not present

## 2019-11-29 MED ORDER — LUSPATERCEPT-AAMT 50 MG/ML SUBCUTANEOUS SOLUTION
SUBCUTANEOUS | 11 refills | 21.00000 days | Status: CP
Start: 2019-11-29 — End: ?

## 2019-12-02 NOTE — Progress Notes (Signed)

## 2019-12-03 ENCOUNTER — Telehealth: Payer: Self-pay | Admitting: Oncology

## 2019-12-03 NOTE — Telephone Encounter (Signed)
Patient phoned on this date and moved lab and injection appt on 12-09-19 at 2:00 to 3:00.

## 2019-12-07 ENCOUNTER — Other Ambulatory Visit: Payer: Medicare Other

## 2019-12-07 ENCOUNTER — Ambulatory Visit: Payer: Medicare Other

## 2019-12-08 ENCOUNTER — Other Ambulatory Visit: Payer: Medicare Other

## 2019-12-08 ENCOUNTER — Ambulatory Visit: Payer: Medicare Other

## 2019-12-09 ENCOUNTER — Other Ambulatory Visit: Payer: Self-pay

## 2019-12-09 ENCOUNTER — Other Ambulatory Visit: Payer: Medicare Other

## 2019-12-09 ENCOUNTER — Inpatient Hospital Stay: Payer: Medicare Other

## 2019-12-09 ENCOUNTER — Ambulatory Visit: Payer: Medicare Other

## 2019-12-09 DIAGNOSIS — D469 Myelodysplastic syndrome, unspecified: Secondary | ICD-10-CM | POA: Diagnosis not present

## 2019-12-09 DIAGNOSIS — D46Z Other myelodysplastic syndromes: Secondary | ICD-10-CM

## 2019-12-09 LAB — CBC WITH DIFFERENTIAL/PLATELET
Abs Immature Granulocytes: 0.01 10*3/uL (ref 0.00–0.07)
Basophils Absolute: 0 10*3/uL (ref 0.0–0.1)
Basophils Relative: 0 %
Eosinophils Absolute: 0 10*3/uL (ref 0.0–0.5)
Eosinophils Relative: 1 %
HCT: 28.4 % — ABNORMAL LOW (ref 39.0–52.0)
Hemoglobin: 9.4 g/dL — ABNORMAL LOW (ref 13.0–17.0)
Immature Granulocytes: 0 %
Lymphocytes Relative: 35 %
Lymphs Abs: 1.1 10*3/uL (ref 0.7–4.0)
MCH: 36 pg — ABNORMAL HIGH (ref 26.0–34.0)
MCHC: 33.1 g/dL (ref 30.0–36.0)
MCV: 108.8 fL — ABNORMAL HIGH (ref 80.0–100.0)
Monocytes Absolute: 0.3 10*3/uL (ref 0.1–1.0)
Monocytes Relative: 8 %
Neutro Abs: 1.7 10*3/uL (ref 1.7–7.7)
Neutrophils Relative %: 56 %
Platelets: 110 10*3/uL — ABNORMAL LOW (ref 150–400)
RBC: 2.61 MIL/uL — ABNORMAL LOW (ref 4.22–5.81)
RDW: 14.1 % (ref 11.5–15.5)
WBC: 3.1 10*3/uL — ABNORMAL LOW (ref 4.0–10.5)
nRBC: 0 % (ref 0.0–0.2)

## 2019-12-09 MED ORDER — LUSPATERCEPT-AAMT 75 MG ~~LOC~~ SOLR
100.0000 mg | SUBCUTANEOUS | Status: DC
  Administered 2019-12-09: 100 mg via SUBCUTANEOUS
  Filled 2019-12-09: qty 2

## 2019-12-13 DIAGNOSIS — D4620 Refractory anemia with excess of blasts, unspecified: Principal | ICD-10-CM

## 2019-12-15 ENCOUNTER — Other Ambulatory Visit: Payer: Self-pay | Admitting: Family Medicine

## 2019-12-16 ENCOUNTER — Telehealth: Payer: Self-pay | Admitting: Family Medicine

## 2019-12-16 NOTE — Telephone Encounter (Signed)
E-scribed refill.  Plz schedule wellness and lab visits.

## 2019-12-16 NOTE — Telephone Encounter (Signed)
Called patient and left voicemail for patient to call back and schedule CPE and labs.

## 2019-12-21 NOTE — Unmapped (Signed)
[facility] Specialty Medication Onboarding    Specialty Medication: REBLOZYL 25MG  INJECTION  Prior Authorization: Approved   Financial Assistance: Yes - grant approved as secondary   Final Copay/Day Supply: $0 / 21 DAYS    Insurance Restrictions: Yes - max 1 month supply     Notes to Pharmacist: 04/26- [facility] has not obtained access to Reblozyl. Sam is still following up on access. PA required. POCs aware of product availability issue and PA- BR    The triage team has completed the benefits investigation and has determined that the patient is able to fill this medication at [facility]. Please contact the patient to complete the onboarding or follow up with the prescribing physician as needed.

## 2019-12-22 NOTE — Unmapped (Signed)
This patient has been disenrolled from the [facility] specialty pharmacy services due to [facility], CPP is cancelling script.  No need to onboard Reblozyl.Ronald Dunlap  [facility] Specialty Pharmacist

## 2019-12-23 NOTE — Progress Notes (Signed)
Pharmacist Chemotherapy Monitoring - Follow Up Assessment    I verify that I have reviewed each item in the below checklist:  . Regimen for the patient is scheduled for the appropriate day and plan matches scheduled date. Marland Kitchen Appropriate non-routine labs are ordered dependent on drug ordered. . If applicable, additional medications reviewed and ordered per protocol based on lifetime cumulative doses and/or treatment regimen.   Plan for follow-up and/or issues identified: No . I-vent associated with next due treatment: No . MD and/or nursing notified: No  Alex Smith K 12/23/2019 8:18 AM

## 2019-12-28 ENCOUNTER — Ambulatory Visit: Payer: Medicare Other

## 2019-12-28 ENCOUNTER — Other Ambulatory Visit: Payer: Medicare Other

## 2019-12-29 ENCOUNTER — Ambulatory Visit: Payer: Medicare Other

## 2019-12-29 ENCOUNTER — Other Ambulatory Visit: Payer: Medicare Other

## 2019-12-30 ENCOUNTER — Inpatient Hospital Stay: Payer: Medicare Other | Attending: Hematology and Oncology

## 2019-12-30 ENCOUNTER — Other Ambulatory Visit: Payer: Self-pay

## 2019-12-30 ENCOUNTER — Inpatient Hospital Stay: Payer: Medicare Other

## 2019-12-30 DIAGNOSIS — D469 Myelodysplastic syndrome, unspecified: Secondary | ICD-10-CM | POA: Insufficient documentation

## 2019-12-30 DIAGNOSIS — D46Z Other myelodysplastic syndromes: Secondary | ICD-10-CM

## 2019-12-30 LAB — CBC WITH DIFFERENTIAL/PLATELET
Abs Immature Granulocytes: 0.01 10*3/uL (ref 0.00–0.07)
Basophils Absolute: 0 10*3/uL (ref 0.0–0.1)
Basophils Relative: 1 %
Eosinophils Absolute: 0.1 10*3/uL (ref 0.0–0.5)
Eosinophils Relative: 3 %
HCT: 29 % — ABNORMAL LOW (ref 39.0–52.0)
Hemoglobin: 9.7 g/dL — ABNORMAL LOW (ref 13.0–17.0)
Immature Granulocytes: 0 %
Lymphocytes Relative: 28 %
Lymphs Abs: 1.1 10*3/uL (ref 0.7–4.0)
MCH: 36.9 pg — ABNORMAL HIGH (ref 26.0–34.0)
MCHC: 33.4 g/dL (ref 30.0–36.0)
MCV: 110.3 fL — ABNORMAL HIGH (ref 80.0–100.0)
Monocytes Absolute: 0.3 10*3/uL (ref 0.1–1.0)
Monocytes Relative: 8 %
Neutro Abs: 2.3 10*3/uL (ref 1.7–7.7)
Neutrophils Relative %: 60 %
Platelets: 122 10*3/uL — ABNORMAL LOW (ref 150–400)
RBC: 2.63 MIL/uL — ABNORMAL LOW (ref 4.22–5.81)
RDW: 15.4 % (ref 11.5–15.5)
WBC: 3.9 10*3/uL — ABNORMAL LOW (ref 4.0–10.5)
nRBC: 0.5 % — ABNORMAL HIGH (ref 0.0–0.2)

## 2019-12-30 MED ORDER — LUSPATERCEPT-AAMT 75 MG ~~LOC~~ SOLR
100.0000 mg | SUBCUTANEOUS | Status: DC
  Administered 2019-12-30: 100 mg via SUBCUTANEOUS
  Filled 2019-12-30: qty 1.5

## 2020-01-18 ENCOUNTER — Other Ambulatory Visit: Payer: Medicare Other

## 2020-01-18 ENCOUNTER — Ambulatory Visit: Payer: Medicare Other

## 2020-01-19 ENCOUNTER — Other Ambulatory Visit: Payer: Medicare Other

## 2020-01-19 ENCOUNTER — Ambulatory Visit: Payer: Medicare Other

## 2020-01-20 ENCOUNTER — Other Ambulatory Visit: Payer: Self-pay

## 2020-01-20 ENCOUNTER — Inpatient Hospital Stay: Payer: Medicare Other

## 2020-01-20 ENCOUNTER — Inpatient Hospital Stay: Payer: Medicare Other | Attending: Hematology and Oncology

## 2020-01-20 DIAGNOSIS — D469 Myelodysplastic syndrome, unspecified: Secondary | ICD-10-CM | POA: Insufficient documentation

## 2020-01-20 DIAGNOSIS — D46Z Other myelodysplastic syndromes: Secondary | ICD-10-CM

## 2020-01-20 DIAGNOSIS — Z95828 Presence of other vascular implants and grafts: Secondary | ICD-10-CM

## 2020-01-20 LAB — CBC WITH DIFFERENTIAL/PLATELET
Abs Immature Granulocytes: 0 10*3/uL (ref 0.00–0.07)
Basophils Absolute: 0 10*3/uL (ref 0.0–0.1)
Basophils Relative: 0 %
Eosinophils Absolute: 0.1 10*3/uL (ref 0.0–0.5)
Eosinophils Relative: 2 %
HCT: 28.6 % — ABNORMAL LOW (ref 39.0–52.0)
Hemoglobin: 9.8 g/dL — ABNORMAL LOW (ref 13.0–17.0)
Immature Granulocytes: 0 %
Lymphocytes Relative: 29 %
Lymphs Abs: 1 10*3/uL (ref 0.7–4.0)
MCH: 37.7 pg — ABNORMAL HIGH (ref 26.0–34.0)
MCHC: 34.3 g/dL (ref 30.0–36.0)
MCV: 110 fL — ABNORMAL HIGH (ref 80.0–100.0)
Monocytes Absolute: 0.3 10*3/uL (ref 0.1–1.0)
Monocytes Relative: 8 %
Neutro Abs: 2.1 10*3/uL (ref 1.7–7.7)
Neutrophils Relative %: 61 %
Platelets: 119 10*3/uL — ABNORMAL LOW (ref 150–400)
RBC: 2.6 MIL/uL — ABNORMAL LOW (ref 4.22–5.81)
RDW: 15.4 % (ref 11.5–15.5)
WBC: 3.5 10*3/uL — ABNORMAL LOW (ref 4.0–10.5)
nRBC: 0 % (ref 0.0–0.2)

## 2020-01-20 MED ORDER — HEPARIN SOD (PORK) LOCK FLUSH 100 UNIT/ML IV SOLN
500.0000 [IU] | Freq: Once | INTRAVENOUS | Status: AC
  Administered 2020-01-20: 500 [IU] via INTRAVENOUS
  Filled 2020-01-20: qty 5

## 2020-01-20 MED ORDER — LUSPATERCEPT-AAMT 75 MG ~~LOC~~ SOLR
100.0000 mg | SUBCUTANEOUS | Status: DC
  Administered 2020-01-20: 100 mg via SUBCUTANEOUS
  Filled 2020-01-20: qty 2

## 2020-01-24 ENCOUNTER — Other Ambulatory Visit: Payer: Self-pay | Admitting: Family Medicine

## 2020-01-25 NOTE — Telephone Encounter (Signed)
Lvm asking pt to call back.  Need to find out if pt is taking famotidine (Pepcid), which is  not on current med list.

## 2020-01-27 NOTE — Telephone Encounter (Signed)
Mychart message sent to patient to clarify medication request.

## 2020-02-08 ENCOUNTER — Other Ambulatory Visit: Payer: Medicare Other

## 2020-02-08 ENCOUNTER — Ambulatory Visit: Payer: Medicare Other | Admitting: Oncology

## 2020-02-08 ENCOUNTER — Ambulatory Visit: Payer: Medicare Other

## 2020-02-10 ENCOUNTER — Inpatient Hospital Stay (HOSPITAL_BASED_OUTPATIENT_CLINIC_OR_DEPARTMENT_OTHER): Payer: Medicare Other | Admitting: Oncology

## 2020-02-10 ENCOUNTER — Other Ambulatory Visit: Payer: Self-pay

## 2020-02-10 ENCOUNTER — Inpatient Hospital Stay: Payer: Medicare Other

## 2020-02-10 VITALS — BP 142/54 | HR 91 | Temp 97.6°F | Resp 20 | Wt 217.2 lb

## 2020-02-10 DIAGNOSIS — C9000 Multiple myeloma not having achieved remission: Secondary | ICD-10-CM

## 2020-02-10 DIAGNOSIS — Z79899 Other long term (current) drug therapy: Secondary | ICD-10-CM

## 2020-02-10 DIAGNOSIS — D469 Myelodysplastic syndrome, unspecified: Secondary | ICD-10-CM | POA: Diagnosis not present

## 2020-02-10 DIAGNOSIS — D46Z Other myelodysplastic syndromes: Secondary | ICD-10-CM

## 2020-02-10 DIAGNOSIS — D472 Monoclonal gammopathy: Secondary | ICD-10-CM

## 2020-02-10 DIAGNOSIS — D4620 Refractory anemia with excess of blasts, unspecified: Secondary | ICD-10-CM | POA: Diagnosis not present

## 2020-02-10 LAB — COMPREHENSIVE METABOLIC PANEL
ALT: 15 U/L (ref 0–44)
AST: 20 U/L (ref 15–41)
Albumin: 3.9 g/dL (ref 3.5–5.0)
Alkaline Phosphatase: 68 U/L (ref 38–126)
Anion gap: 9 (ref 5–15)
BUN: 36 mg/dL — ABNORMAL HIGH (ref 8–23)
CO2: 26 mmol/L (ref 22–32)
Calcium: 8.7 mg/dL — ABNORMAL LOW (ref 8.9–10.3)
Chloride: 103 mmol/L (ref 98–111)
Creatinine, Ser: 1.84 mg/dL — ABNORMAL HIGH (ref 0.61–1.24)
GFR calc Af Amer: 38 mL/min — ABNORMAL LOW (ref >60)
GFR calc non Af Amer: 33 mL/min — ABNORMAL LOW (ref >60)
Glucose, Bld: 294 mg/dL — ABNORMAL HIGH (ref 70–99)
Potassium: 4.4 mmol/L (ref 3.5–5.1)
Sodium: 138 mmol/L (ref 135–145)
Total Bilirubin: 0.8 mg/dL (ref 0.3–1.2)
Total Protein: 7.7 g/dL (ref 6.5–8.1)

## 2020-02-10 LAB — CBC WITH DIFFERENTIAL/PLATELET
Abs Immature Granulocytes: 0.01 10*3/uL (ref 0.00–0.07)
Basophils Absolute: 0 10*3/uL (ref 0.0–0.1)
Basophils Relative: 1 %
Eosinophils Absolute: 0.1 10*3/uL (ref 0.0–0.5)
Eosinophils Relative: 3 %
HCT: 28.4 % — ABNORMAL LOW (ref 39.0–52.0)
Hemoglobin: 9.7 g/dL — ABNORMAL LOW (ref 13.0–17.0)
Immature Granulocytes: 0 %
Lymphocytes Relative: 28 %
Lymphs Abs: 1 10*3/uL (ref 0.7–4.0)
MCH: 37.3 pg — ABNORMAL HIGH (ref 26.0–34.0)
MCHC: 34.2 g/dL (ref 30.0–36.0)
MCV: 109.2 fL — ABNORMAL HIGH (ref 80.0–100.0)
Monocytes Absolute: 0.2 10*3/uL (ref 0.1–1.0)
Monocytes Relative: 7 %
Neutro Abs: 2.3 10*3/uL (ref 1.7–7.7)
Neutrophils Relative %: 61 %
Platelets: 112 10*3/uL — ABNORMAL LOW (ref 150–400)
RBC: 2.6 MIL/uL — ABNORMAL LOW (ref 4.22–5.81)
RDW: 15.2 % (ref 11.5–15.5)
WBC: 3.7 10*3/uL — ABNORMAL LOW (ref 4.0–10.5)
nRBC: 0.5 % — ABNORMAL HIGH (ref 0.0–0.2)

## 2020-02-10 MED ORDER — LUSPATERCEPT-AAMT 75 MG ~~LOC~~ SOLR
100.0000 mg | SUBCUTANEOUS | Status: AC
  Administered 2020-02-10: 100 mg via SUBCUTANEOUS
  Filled 2020-02-10: qty 2

## 2020-02-11 ENCOUNTER — Telehealth: Payer: Self-pay | Admitting: *Deleted

## 2020-02-11 ENCOUNTER — Other Ambulatory Visit: Payer: Self-pay | Admitting: *Deleted

## 2020-02-11 DIAGNOSIS — D46Z Other myelodysplastic syndromes: Secondary | ICD-10-CM

## 2020-02-11 LAB — KAPPA/LAMBDA LIGHT CHAINS
Kappa free light chain: 80.2 mg/L — ABNORMAL HIGH (ref 3.3–19.4)
Kappa, lambda light chain ratio: 6.52 — ABNORMAL HIGH (ref 0.26–1.65)
Lambda free light chains: 12.3 mg/L (ref 5.7–26.3)

## 2020-02-11 NOTE — Telephone Encounter (Signed)
I have put the order in for patient to get his port DC'd Alex Smith over at the vascular office will call patient.  I called patient today and got his voicemail and left him a message that Alex Smith will be calling from the vascular with a appointment for him to get his port removed and give all instructions at that time

## 2020-02-13 NOTE — Progress Notes (Signed)
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 [name] and is transitioning his care to me. He has following issues:  1.Hairy cell leukemia that was initially diagnosed on bone marrow biopsy in June 2016. Bone marrow biopsy at that time showed extensive marrow involvement with recurrent hairy cell leukemia approximately 80% of the cells in the core 10% monoclonal plasma cell infiltrate compatible with plasma cell neoplasm. Peripheral smear revealed leukopenia with a white count of 1900 compatible with hairy cell leukemia.FISH studies revealed an abnormal myeloma panel with CCND1/IGH translocation t (11;14) and loss of MAF/16q. FISH studies for MDS were negative. Cytogenetics were normal (46, XY).He has last received cladribine for this back in July 2016  2.Smoldering multiple myeloma: Bone marrow biopsy in March 2017 revealed persistent plasma cell neoplasm with monoclonal plasma cells estimated at 20 to 30%. No morphologic evidence of residual hairy cell leukemia.Marrow was normocellular for age with relative erythroid hyperplasia, relative myeloid hypoplasia, mild dyspoiesis, adequate megakaryocytes and no increased blasts. There was no significant increase  in marrow reticulin fibers. Iron was present. FISH studies for MDS were negative. FISH panel for myeloma revealed CCND1/IGH translocation- t(11;14) and loss of MAF/16q. Cytogenetics were normal (46, XY).Last bone marrow biopsy was in October 2018 which showed variably cellular marrow 10 to 50% with kappa restricted plasmacytosis of 10 to 15%. No evidence of leukemia lymphoma or high-grade dysplasia. Normal cytogenetics. He has also been followed by Dr. [name] at [facility]. He has received Revlimid and Decadron in the past in 2017 which was subsequently stopped. PET scan in July 2017 showed no hypermetabolic adenopathy or focal skeletal lesions.  3.Probable MDS: He has received a trial of Procrit in the past in 2017 but apparently did not respond to it. He has been getting periodic blood transfusions to maintain his hemoglobin closer to 9 as he is symptomatic when his hemoglobin is less than 9. He has been getting about 2-3 blood transfusions during the year. He does have some mild leukopenia/neutropenia and is on acyclovir prophylaxis. He gets weekly G-CSF when his ANC is less than 1.5.  4.Also has history of B12 deficiency for which he is on oral B12.   Interval history-patient is tolerating Luspatercept well and is getting it every 3 weeks without any significant side effects.  He still has chronic fatigue and does not report any significant improvement in his symptoms although his transfusion needs have come down.  ECOG PS-1 Pain scale- 0   Review of systems- Review of Systems  Constitutional: Positive for malaise/fatigue. Negative for chills, fever and weight loss.  HENT: Negative for congestion, ear discharge and nosebleeds.   Eyes: Negative for blurred vision.  Respiratory: Negative for cough, hemoptysis, sputum production, shortness of breath and wheezing.   Cardiovascular: Negative for chest pain, palpitations, orthopnea and claudication.  Gastrointestinal: Negative  for abdominal pain, blood in stool,  constipation, diarrhea, heartburn, melena, nausea and vomiting.  Genitourinary: Negative for dysuria, flank pain, frequency, hematuria and urgency.  Musculoskeletal: Negative for back pain, joint pain and myalgias.  Skin: Negative for rash.  Neurological: Negative for dizziness, tingling, focal weakness, seizures, weakness and headaches.  Endo/Heme/Allergies: Does not bruise/bleed easily.  Psychiatric/Behavioral: Negative for depression and suicidal ideas. The patient does not have insomnia.       Allergies  Allergen Reactions   Rituximab Rash    Chest tightness Chest tightness   Blood-Group Specific Substance Other (See Comments)    Had a post transfusion reaction of red blood cells; NOW REQUIRES WASHED BLOOD CELLS Had a post transfusion reaction of red blood cells; NOW REQUIRES WASHED BLOOD CELLS   Primaxin [Imipenem] Other (See Comments)    Possible allergy Possible allergy   Voriconazole Other (See Comments)   Sulfa Antibiotics Itching and Rash   Sulfacetamide Sodium Itching and Rash     Past Medical History:  Diagnosis Date   Anemia    B12 deficiency    Basal cell carcinoma of face    BPH (benign prostatic hypertrophy)    followed by urology, discharged (Dr. Bernardo Heater)   CAP (community acquired pneumonia) 02/15/2015   CKD stage 3 due to type 2 diabetes mellitus ([place]) 11/14/2017   Colon polyps    Diverticulosis    GERD (gastroesophageal reflux disease)    Hairy cell leukemia ([place]) 2006   recurrent, seizure on rituxan, now on cladribine [name])   History of pneumonia 2000's   "once" (07/07/2012)   History of shingles    HLD (hyperlipidemia)    Hypertension    Pneumonia    Shortness of breath dyspnea    Systolic murmur 62/22/9798   Type 2 diabetes, controlled, with retinopathy ([place])      Past Surgical History:  Procedure Laterality Date   25 GAUGE PARS PLANA VITRECTOMY WITH 20 GAUGE MVR PORT FOR  MACULAR HOLE  07/07/2012   Procedure: 25 GAUGE PARS PLANA VITRECTOMY WITH 20 GAUGE MVR PORT FOR MACULAR HOLE;  Surgeon: Hayden Pedro, MD;  Location: [place];  Service: Ophthalmology;  Laterality: Left;   BONE MARROW BIOPSY  2016   CARDIOVASCULAR STRESS TEST  2013   treadmill - no evidence ischemia, EF 61%   CATARACT EXTRACTION W/ INTRAOCULAR LENS  IMPLANT, BILATERAL  ~ 2010   COLONOSCOPY  2014   [place] WNL no rpt needed, h/o polyps   EYE SURGERY Left 06/2012   laser surgery   GAS INSERTION  07/07/2012   Procedure: INSERTION OF GAS;  Surgeon: Hayden Pedro, MD;  Location: [place];  Service: Ophthalmology;  Laterality: Left;  [id number]   PERIPHERAL VASCULAR CATHETERIZATION N/A 02/23/2015   Procedure: Glori Luis Cath Insertion;  Surgeon: Algernon Huxley, MD;  Location: [place] [facility];  Service: Cardiovascular;  Laterality: N/A;   SERUM PATCH  07/07/2012   Procedure: SERUM PATCH;  Surgeon: Hayden Pedro, MD;  Location: [place];  Service: Ophthalmology;  Laterality: Left;   SKIN CANCER EXCISION     "all over my face" (07/07/2012)    Social History   Socioeconomic History   Marital status: Married    Spouse name: Not on file   Number of children: Not on file   Years of education: Not on file   Highest education level: Not on file  Occupational History   Not on file  Tobacco Use   Smoking status: Former Smoker    Packs/day: 2.00    Years:  25.00    Pack years: 50.00    Types: Cigarettes    Quit date: 02/06/1969    Years since quitting: 51.0   Smokeless tobacco: Never Used   Tobacco comment: 07/07/2012 "stopped smoking ~ 40 yr ago; smoked 20-110yr  Vaping Use   Vaping Use: Never used  Substance and Sexual Activity   Alcohol use: Yes    Alcohol/week: 0.0 standard drinks    Comment: rare   Drug use: No   Sexual activity: Never  Other Topics Concern   Not on file  Social History Narrative   Married >50 yrs, cares for wife. 1 cat   Occupation: was cArt gallery manager  works part time for [employer]   Activity: likes to golf   Diet: moderate water, fruits/vegetables daily   Social Determinants of HRadio broadcast assistantStrain:    Difficulty of Paying Living Expenses:   Food Insecurity:    Worried About RCharity fundraiserin the Last Year:    RArboriculturistin the Last Year:   Transportation Needs:    LFilm/video editor(Medical):    Lack of Transportation (Non-Medical):   Physical Activity:    Days of Exercise per Week:    Minutes of Exercise per Session:   Stress:    Feeling of Stress :   Social Connections:    Frequency of Communication with Friends and Family:    Frequency of Social Gatherings with Friends and Family:    Attends Religious Services:    Active Member of Clubs or Organizations:    Attends CMusic therapist    Marital Status:   Intimate Partner Violence:    Fear of Current or Ex-Partner:    Emotionally Abused:    Physically Abused:    Sexually Abused:     Family History  Problem Relation Age of Onset   Dementia Mother    Heart failure Father 725  Cancer Sister        breast   Diabetes Paternal Uncle    Diabetes Paternal Aunt    CAD Brother 475      MI   Stroke Neg Hx      Current Outpatient Medications:    aspirin EC 81 MG tablet, Take 81 mg by mouth daily., Disp: , Rfl:    DIPHENHYDRAMINE-ACETAMINOPHEN PO, Take 1 tablet by mouth at bedtime., Disp: , Rfl:    doxazosin (CARDURA) 1 MG tablet, TAKE ONE TABLET BY MOUTH EVERY DAY, Disp: 30 tablet, Rfl: 5   famotidine (PEPCID) 40 MG tablet, TAKE HALF A TABLET BY MOUTH AT BEDTIME, Disp: 45 tablet, Rfl: 1   glucose blood (ACCU-CHEK AVIVA PLUS) test strip, USE TO CHECK SUGAR DAILY E11.319, Disp: 200 each, Rfl: 3   HUMULIN N 100 UNIT/ML injection, INJECT 39 UNITS SUBCUTANEOUSLY TWICE DAILY BEFORE A MEAL, Disp: 20 mL, Rfl: 5   HUMULIN R 100 UNIT/ML injection, INJECT 10 UNITS INTO THE SKIN TWICE DAILY WITH MEALS, Disp:  10 mL, Rfl: 5   hydrochlorothiazide (HYDRODIURIL) 25 MG tablet, TAKE 1 TABLET BY MOUTH DAILY, Disp: 90 tablet, Rfl: 1   lidocaine-prilocaine (EMLA) cream, Apply 1 application topically as needed., Disp: 30 g, Rfl: 3   lisinopril (ZESTRIL) 20 MG tablet, TAKE ONE TABLET BY MOUTH TWICE DAILY, Disp: 180 tablet, Rfl: 0   metFORMIN (GLUCOPHAGE) 1000 MG tablet, TAKE ONE (1) TABLET BY MOUTH TWO TIMES PER DAY, Disp: 180 tablet, Rfl: 3   Microlet Lancets MISC, USE AS  DIRECTED, Disp: 200 each, Rfl: 3   omeprazole (PRILOSEC) 20 MG capsule, Take 1 capsule by mouth 1 day or 1 dose., Disp: , Rfl:    pravastatin (PRAVACHOL) 20 MG tablet, TAKE ONE TABLET EVERY EVENING, Disp: 30 tablet, Rfl: 5   tamsulosin (FLOMAX) 0.4 MG CAPS capsule, TAKE 1 CAPSULE BY MOUTH EVERY DAY, Disp: 30 capsule, Rfl: 3   temazepam (RESTORIL) 7.5 MG capsule, Take 1 capsule by mouth daily., Disp: , Rfl:    traZODone (DESYREL) 50 MG tablet, TAKE 1/2 TO 1 TABLET BY MOUTH AT BEDTIMEAS NEEDED FOR SLEEP, Disp: 30 tablet, Rfl: 0   vitamin B-12 (CYANOCOBALAMIN) 1000 MCG tablet, Take 1 tablet (1,000 mcg total) by mouth daily., Disp: 90 tablet, Rfl: 3   vitamin E 400 UNIT capsule, Take 200 Units by mouth daily. , Disp: , Rfl:    acyclovir (ZOVIRAX) 400 MG tablet, TAKE TWO TABLETS TWICE DAILY (Patient not taking: Reported on 11/15/2019), Disp: 120 tablet, Rfl: 3  Current Facility-Administered Medications:    Tbo-Filgrastim (GRANIX) injection 480 mcg, 480 mcg, Subcutaneous, Once, Corcoran, Melissa C, MD  Facility-Administered Medications Ordered in Other Visits:    heparin lock flush 100 unit/mL, 500 Units, Intravenous, Once, Corcoran, Melissa C, MD   luspatercept-aamt (REBLOZYL) subcutaneous injection 100 mg, 100 mg, Subcutaneous, Q30 days, Sindy Guadeloupe, MD   luspatercept-aamt (REBLOZYL) subcutaneous injection 100 mg, 100 mg, Subcutaneous, Q30 days, Sindy Guadeloupe, MD, 100 mg at 11/18/19 1252   luspatercept-aamt (REBLOZYL)  subcutaneous injection 100 mg, 100 mg, Subcutaneous, Q21 days, Sindy Guadeloupe, MD, 100 mg at 02/10/20 1435   sodium chloride 0.9 % injection 10 mL, 10 mL, Intravenous, PRN, [name], Melissa C, MD, 10 mL at 03/03/15 0903   sodium chloride 0.9 % injection 10 mL, 10 mL, Intracatheter, PRN, [name], Melissa C, MD, 10 mL at 03/10/15 1410   sodium chloride flush (NS) 0.9 % injection 10 mL, 10 mL, Intravenous, PRN, [name], Melissa C, MD, 10 mL at 07/23/18 1517  Physical exam:  Vitals:   02/10/20 1354  BP: (!) 142/54  Pulse: 91  Resp: 20  Temp: 97.6 F (36.4 C)  SpO2: 97%  Weight: 217 lb 3.2 oz (98.5 kg)   Physical Exam Constitutional:      General: He is not in acute distress. Cardiovascular:     Rate and Rhythm: Normal rate and regular rhythm.     Heart sounds: Normal heart sounds.  Pulmonary:     Effort: Pulmonary effort is normal.     Breath sounds: Normal breath sounds.  Abdominal:     General: Bowel sounds are normal.     Palpations: Abdomen is soft.  Skin:    General: Skin is warm and dry.  Neurological:     Mental Status: He is alert and oriented to person, place, and time.      CMP Latest Ref Rng & Units 02/10/2020  Glucose 70 - 99 mg/dL 294(H)  BUN 8 - 23 mg/dL 36(H)  Creatinine 0.61 - 1.24 mg/dL 1.84(H)  Sodium 135 - 145 mmol/L 138  Potassium 3.5 - 5.1 mmol/L 4.4  Chloride 98 - 111 mmol/L 103  CO2 22 - 32 mmol/L 26  Calcium 8.9 - 10.3 mg/dL 8.7(L)  Total Protein 6.5 - 8.1 g/dL 7.7  Total Bilirubin 0.3 - 1.2 mg/dL 0.8  Alkaline Phos 38 - 126 U/L 68  AST 15 - 41 U/L 20  ALT 0 - 44 U/L 15   CBC Latest Ref Rng & Units 02/10/2020  WBC 4.0 - 10.5 K/uL 3.7(L)  Hemoglobin 13.0 - 17.0 g/dL 9.7(L)  Hematocrit 39 - 52 % 28.4(L)  Platelets 150 - 400 K/uL 112(L)      Assessment and plan- Patient is a 85 y.o. male who is here for follow-up of following issues:  1.  MDS: Currently on Luspatercept and hemoglobin is remaining stable between 9-10.  He has not  required any blood transfusion in the last 2 to 3 months.  Patient notes that he has coverage for Luspatercept through the drug company for the ER.  He will continue to receive it every 3 weeks and I will see him back in 3 months  2.  Smoldering multiple myeloma:Renal functions are currently stable around 1.8.  Anemia improved after Luspatercept.  He also has mild chronic thrombocytopenia with a platelet count between 100s to 120s which is essentially stable.  Myeloma panel is currently pending and serum free light chains little higher at 6 is compared to 5 before.  Continue to monitor every 3 months   Visit Diagnosis 1. High risk medication use   2. Myelodysplasia present in bone marrow ([place])   3. Myelodysplastic syndrome, low grade ([facility])   4. Smoldering myeloma ([place])      Dr. Randa Evens, MD, MPH [facility] [phone] 02/13/2020 6:59 AM

## 2020-02-14 ENCOUNTER — Telehealth (INDEPENDENT_AMBULATORY_CARE_PROVIDER_SITE_OTHER): Payer: Self-pay

## 2020-02-14 LAB — MULTIPLE MYELOMA PANEL, SERUM
Albumin SerPl Elph-Mcnc: 3.7 g/dL (ref 2.9–4.4)
Albumin/Glob SerPl: 1.1 (ref 0.7–1.7)
Alpha 1: 0.2 g/dL (ref 0.0–0.4)
Alpha2 Glob SerPl Elph-Mcnc: 0.8 g/dL (ref 0.4–1.0)
B-Globulin SerPl Elph-Mcnc: 0.8 g/dL (ref 0.7–1.3)
Gamma Glob SerPl Elph-Mcnc: 1.7 g/dL (ref 0.4–1.8)
Globulin, Total: 3.5 g/dL (ref 2.2–3.9)
IgA: 16 mg/dL — ABNORMAL LOW (ref 61–437)
IgG (Immunoglobin G), Serum: 1820 mg/dL — ABNORMAL HIGH (ref 603–1613)
IgM (Immunoglobulin M), Srm: 23 mg/dL (ref 15–143)
M Protein SerPl Elph-Mcnc: 1.5 g/dL — ABNORMAL HIGH
Total Protein ELP: 7.2 g/dL (ref 6.0–8.5)

## 2020-02-14 NOTE — Telephone Encounter (Signed)
I attempted to contact the patient and a message was left for a return call. 

## 2020-02-15 ENCOUNTER — Other Ambulatory Visit: Payer: Self-pay

## 2020-02-15 ENCOUNTER — Other Ambulatory Visit (INDEPENDENT_AMBULATORY_CARE_PROVIDER_SITE_OTHER): Payer: Self-pay | Admitting: Nurse Practitioner

## 2020-02-15 ENCOUNTER — Other Ambulatory Visit
Admission: RE | Admit: 2020-02-15 | Discharge: 2020-02-15 | Disposition: A | Discharge status: Home / Self Care | Payer: Medicare Other | Source: Ambulatory Visit | Attending: Vascular Surgery | Admitting: Vascular Surgery

## 2020-02-15 DIAGNOSIS — Z01812 Encounter for preprocedural laboratory examination: Secondary | ICD-10-CM | POA: Diagnosis not present

## 2020-02-15 DIAGNOSIS — Z20822 Contact with and (suspected) exposure to covid-19: Secondary | ICD-10-CM | POA: Diagnosis not present

## 2020-02-15 LAB — SARS CORONAVIRUS 2 (TAT 6-24 HRS): SARS Coronavirus 2: NEGATIVE

## 2020-02-16 ENCOUNTER — Encounter
Admission: RE | Disposition: A | Discharge status: Home / Self Care | Payer: Self-pay | Source: Home / Self Care | Attending: Vascular Surgery

## 2020-02-16 ENCOUNTER — Other Ambulatory Visit: Payer: Self-pay

## 2020-02-16 ENCOUNTER — Encounter: Payer: Self-pay | Admitting: Vascular Surgery

## 2020-02-16 ENCOUNTER — Ambulatory Visit
Admission: RE | Admit: 2020-02-16 | Discharge: 2020-02-16 | Disposition: A | Discharge status: Home / Self Care | Payer: Medicare Other | Attending: Vascular Surgery | Admitting: Vascular Surgery

## 2020-02-16 DIAGNOSIS — Z883 Allergy status to other anti-infective agents status: Secondary | ICD-10-CM | POA: Insufficient documentation

## 2020-02-16 DIAGNOSIS — Z79899 Other long term (current) drug therapy: Secondary | ICD-10-CM | POA: Diagnosis not present

## 2020-02-16 DIAGNOSIS — Z888 Allergy status to other drugs, medicaments and biological substances status: Secondary | ICD-10-CM | POA: Diagnosis not present

## 2020-02-16 DIAGNOSIS — K219 Gastro-esophageal reflux disease without esophagitis: Secondary | ICD-10-CM | POA: Insufficient documentation

## 2020-02-16 DIAGNOSIS — Z794 Long term (current) use of insulin: Secondary | ICD-10-CM | POA: Insufficient documentation

## 2020-02-16 DIAGNOSIS — E785 Hyperlipidemia, unspecified: Secondary | ICD-10-CM | POA: Diagnosis not present

## 2020-02-16 DIAGNOSIS — Z8249 Family history of ischemic heart disease and other diseases of the circulatory system: Secondary | ICD-10-CM | POA: Diagnosis not present

## 2020-02-16 DIAGNOSIS — Z882 Allergy status to sulfonamides status: Secondary | ICD-10-CM | POA: Diagnosis not present

## 2020-02-16 DIAGNOSIS — Z7982 Long term (current) use of aspirin: Secondary | ICD-10-CM | POA: Insufficient documentation

## 2020-02-16 DIAGNOSIS — C9591 Leukemia, unspecified, in remission: Secondary | ICD-10-CM | POA: Insufficient documentation

## 2020-02-16 DIAGNOSIS — Z452 Encounter for adjustment and management of vascular access device: Secondary | ICD-10-CM | POA: Diagnosis not present

## 2020-02-16 DIAGNOSIS — N183 Chronic kidney disease, stage 3 unspecified: Secondary | ICD-10-CM | POA: Diagnosis not present

## 2020-02-16 DIAGNOSIS — E11319 Type 2 diabetes mellitus with unspecified diabetic retinopathy without macular edema: Secondary | ICD-10-CM | POA: Diagnosis not present

## 2020-02-16 DIAGNOSIS — E1122 Type 2 diabetes mellitus with diabetic chronic kidney disease: Secondary | ICD-10-CM | POA: Insufficient documentation

## 2020-02-16 DIAGNOSIS — Z87891 Personal history of nicotine dependence: Secondary | ICD-10-CM | POA: Diagnosis not present

## 2020-02-16 DIAGNOSIS — I129 Hypertensive chronic kidney disease with stage 1 through stage 4 chronic kidney disease, or unspecified chronic kidney disease: Secondary | ICD-10-CM | POA: Insufficient documentation

## 2020-02-16 DIAGNOSIS — Z803 Family history of malignant neoplasm of breast: Secondary | ICD-10-CM | POA: Diagnosis not present

## 2020-02-16 DIAGNOSIS — N400 Enlarged prostate without lower urinary tract symptoms: Secondary | ICD-10-CM | POA: Diagnosis not present

## 2020-02-16 HISTORY — PX: PORTA CATH REMOVAL: CATH118286

## 2020-02-16 LAB — GLUCOSE, CAPILLARY: Glucose-Capillary: 168 mg/dL — ABNORMAL HIGH (ref 70–99)

## 2020-02-16 SURGERY — PORTA CATH REMOVAL
Anesthesia: Moderate Sedation

## 2020-02-16 MED ORDER — CHLORHEXIDINE GLUCONATE CLOTH 2 % EX PADS
6.0000 | MEDICATED_PAD | Freq: Every day | CUTANEOUS | Status: DC
  Administered 2020-02-16: 6 via TOPICAL

## 2020-02-16 MED ORDER — MIDAZOLAM HCL 5 MG/5ML IJ SOLN
INTRAMUSCULAR | Status: AC
  Filled 2020-02-16: qty 5

## 2020-02-16 MED ORDER — CLINDAMYCIN PHOSPHATE 300 MG/50ML IV SOLN: 300.0000 mg | Freq: Once | INTRAVENOUS | Status: AC

## 2020-02-16 MED ORDER — MIDAZOLAM HCL 2 MG/ML PO SYRP: 8.0000 mg | ORAL_SOLUTION | Freq: Once | ORAL | Status: DC | PRN

## 2020-02-16 MED ORDER — ONDANSETRON HCL 4 MG/2ML IJ SOLN: 4.0000 mg | Freq: Four times a day (QID) | INTRAMUSCULAR | Status: DC | PRN

## 2020-02-16 MED ORDER — MIDAZOLAM HCL 2 MG/2ML IJ SOLN
INTRAMUSCULAR | Status: DC | PRN
  Administered 2020-02-16: 2 mg via INTRAVENOUS

## 2020-02-16 MED ORDER — CLINDAMYCIN PHOSPHATE 300 MG/50ML IV SOLN
INTRAVENOUS | Status: AC
  Administered 2020-02-16: 300 mg via INTRAVENOUS
  Filled 2020-02-16: qty 50

## 2020-02-16 MED ORDER — FAMOTIDINE 20 MG PO TABS: 40.0000 mg | ORAL_TABLET | Freq: Once | ORAL | Status: DC | PRN

## 2020-02-16 MED ORDER — METHYLPREDNISOLONE SODIUM SUCC 125 MG IJ SOLR: 125.0000 mg | Freq: Once | INTRAMUSCULAR | Status: DC | PRN

## 2020-02-16 MED ORDER — HYDROMORPHONE HCL 1 MG/ML IJ SOLN: 1.0000 mg | Freq: Once | INTRAMUSCULAR | Status: DC | PRN

## 2020-02-16 MED ORDER — DIPHENHYDRAMINE HCL 50 MG/ML IJ SOLN: 50.0000 mg | Freq: Once | INTRAMUSCULAR | Status: DC | PRN

## 2020-02-16 MED ORDER — SODIUM CHLORIDE 0.9 % IV SOLN: INTRAVENOUS | Status: DC

## 2020-02-16 SURGICAL SUPPLY — 6 items
DERMABOND ADVANCED (GAUZE/BANDAGES/DRESSINGS) ×2
DERMABOND ADVANCED .7 DNX12 (GAUZE/BANDAGES/DRESSINGS) IMPLANT
PACK ANGIOGRAPHY (CUSTOM PROCEDURE TRAY) ×2 IMPLANT
SUT MNCRL AB 4-0 PS2 18 (SUTURE) ×2 IMPLANT
SUT VIC AB 3-0 SH 27 (SUTURE) ×2
SUT VIC AB 3-0 SH 27X BRD (SUTURE) IMPLANT

## 2020-02-16 NOTE — Op Note (Signed)
[facility]       Operative Note  Date: 02/16/2020  Preoperative diagnosis:  1. Leukemia, completed therapy no longer using port  Postoperative diagnosis:  Same as above  Procedures: #1. Removal of right jugular port a cath   Surgeon: Leotis Pain, MD  Anesthesia: Local with moderate conscious sedation for 15 minutes using 2 mg of Versed   Fluoroscopy time: none  Contrast used: 0  Estimated blood loss: Minimal  Indication for the procedure:  The patient is a 85 y.o. male who has completed his therapy for leukemia and no longer needs their Port-A-Cath. The patient desires to have this removed. Risks and benefits including need for potential replacement with recurrent disease were discussed and patient is agreeable to proceed.  Description of procedure: The patient was brought to the vascular and interventional radiology suite. Moderate conscious sedation was administered during a face to face encounter with the patient throughout the procedure with my supervision of the RN administering medicines and monitoring the patient's vital signs, pulse oximetry, telemetry and mental status throughout from the start of the procedure until the patient was taken to the recovery room.  The right neck chest and shoulder were sterilely prepped and draped, and a sterile surgical field was created. The area was then anesthetized with 1% lidocaine copiously. The previous incision was reopened and electrocautery used to dissected down to the port and the catheter. These were dissected free and the catheter was gently removed from the vein in its entirety. The port was dissected out from the fibrous connective tissue and the Prolene sutures were removed. The port was then removed in its entirety including the catheter. The wound was then closed with a 3-0 Vicryl and a 4-0 Monocryl and Dermabond was placed as a dressing. The patient was then taken to  the recovery room in stable condition having tolerated the procedure well.  Complications: none  Condition: stable   Leotis Pain, MD 02/16/2020 11:33 AM   This note was created with Dragon Medical transcription system. Any errors in dictation are purely unintentional.

## 2020-02-16 NOTE — H&P (Signed)
[facility] History & Physical Update  The patient was interviewed and re-examined.  The patient's previous History and Physical has been reviewed and is unchanged.  There is no change in the plan of care. We plan to proceed with the scheduled procedure.  Leotis Pain, MD  02/16/2020, 10:55 AM

## 2020-02-16 NOTE — Discharge Instructions (Signed)
Moderate Conscious Sedation, Adult, Care After These instructions provide you with information about caring for yourself after your procedure. Your health care provider may also give you more specific instructions. Your treatment has been planned according to current medical practices, but problems sometimes occur. Call your health care provider if you have any problems or questions after your procedure. What can I expect after the procedure? After your procedure, it is common:  To feel sleepy for several hours.  To feel clumsy and have poor balance for several hours.  To have poor judgment for several hours.  To vomit if you eat too soon. Follow these instructions at home: For at least 24 hours after the procedure:   Do not: ? Participate in activities where you could fall or become injured. ? Drive. ? Use heavy machinery. ? Drink alcohol. ? Take sleeping pills or medicines that cause drowsiness. ? Make important decisions or sign legal documents. ? Take care of children on your own.  Rest. Eating and drinking  Follow the diet recommended by your health care provider.  If you vomit: ? Drink water, juice, or soup when you can drink without vomiting. ? Make sure you have little or no nausea before eating solid foods. General instructions  Have a responsible adult stay with you until you are awake and alert.  Take over-the-counter and prescription medicines only as told by your health care provider.  If you smoke, do not smoke without supervision.  Keep all follow-up visits as told by your health care provider. This is important. Contact a health care provider if:  You keep feeling nauseous or you keep vomiting.  You feel light-headed.  You develop a rash.  You have a fever. Get help right away if:  You have trouble breathing. This information is not intended to replace advice given to you by your health care provider. Make sure you discuss any questions you have  with your health care provider. Document Revised: 07/18/2017 Document Reviewed: 11/25/2015 Elsevier Patient Education  2020 Elsevier Inc. Implanted Port Removal, Care After This sheet gives you information about how to care for yourself after your procedure. Your health care provider may also give you more specific instructions. If you have problems or questions, contact your health care provider. What can I expect after the procedure? After the procedure, it is common to have:  Soreness or pain near your incision.  Some swelling or bruising near your incision. Follow these instructions at home: Medicines  Take over-the-counter and prescription medicines only as told by your health care provider.  If you were prescribed an antibiotic medicine, take it as told by your health care provider. Do not stop taking the antibiotic even if you start to feel better. Bathing  Do not take baths, swim, or use a hot tub until your health care provider approves. Ask your health care provider if you can take showers. You may only be allowed to take sponge baths. Incision care   Follow instructions from your health care provider about how to take care of your incision. Make sure you: ? Wash your hands with soap and water before you change your bandage (dressing). If soap and water are not available, use hand sanitizer. ? Change your dressing as told by your health care provider. ? Keep your dressing dry. ? Leave stitches (sutures), skin glue, or adhesive strips in place. These skin closures may need to stay in place for 2 weeks or longer. If adhesive strip edges start to loosen   and curl up, you may trim the loose edges. Do not remove adhesive strips completely unless your health care provider tells you to do that.  Check your incision area every day for signs of infection. Check for: ? More redness, swelling, or pain. ? More fluid or blood. ? Warmth. ? Pus or a bad smell. Driving   Do not drive  for 24 hours if you were given a medicine to help you relax (sedative) during your procedure.  If you did not receive a sedative, ask your health care provider when it is safe to drive. Activity  Return to your normal activities as told by your health care provider. Ask your health care provider what activities are safe for you.  Do not lift anything that is heavier than 10 lb (4.5 kg), or the limit that you are told, until your health care provider says that it is safe.  Do not do activities that involve lifting your arms over your head. General instructions  Do not use any products that contain nicotine or tobacco, such as cigarettes and e-cigarettes. These can delay healing. If you need help quitting, ask your health care provider.  Keep all follow-up visits as told by your health care provider. This is important. Contact a health care provider if:  You have more redness, swelling, or pain around your incision.  You have more fluid or blood coming from your incision.  Your incision feels warm to the touch.  You have pus or a bad smell coming from your incision.  You have pain that is not relieved by your pain medicine. Get help right away if you have:  A fever or chills.  Chest pain.  Difficulty breathing. Summary  After the procedure, it is common to have pain, soreness, swelling, or bruising near your incision.  If you were prescribed an antibiotic medicine, take it as told by your health care provider. Do not stop taking the antibiotic even if you start to feel better.  Do not drive for 24 hours if you were given a sedative during your procedure.  Return to your normal activities as told by your health care provider. Ask your health care provider what activities are safe for you. This information is not intended to replace advice given to you by your health care provider. Make sure you discuss any questions you have with your health care provider. Document Revised:  09/18/2017 Document Reviewed: 09/18/2017 Elsevier Patient Education  2020 Elsevier Inc.  

## 2020-02-22 DIAGNOSIS — D46Z Other myelodysplastic syndromes: Secondary | ICD-10-CM | POA: Diagnosis not present

## 2020-02-22 DIAGNOSIS — E1122 Type 2 diabetes mellitus with diabetic chronic kidney disease: Secondary | ICD-10-CM | POA: Diagnosis not present

## 2020-02-22 DIAGNOSIS — C9000 Multiple myeloma not having achieved remission: Secondary | ICD-10-CM | POA: Diagnosis not present

## 2020-02-22 DIAGNOSIS — Z794 Long term (current) use of insulin: Secondary | ICD-10-CM | POA: Diagnosis not present

## 2020-02-22 DIAGNOSIS — Z0000 Encounter for general adult medical examination without abnormal findings: Secondary | ICD-10-CM | POA: Diagnosis not present

## 2020-02-22 DIAGNOSIS — N189 Chronic kidney disease, unspecified: Secondary | ICD-10-CM | POA: Diagnosis not present

## 2020-02-22 DIAGNOSIS — E785 Hyperlipidemia, unspecified: Secondary | ICD-10-CM | POA: Diagnosis not present

## 2020-02-29 ENCOUNTER — Other Ambulatory Visit: Payer: Self-pay | Admitting: Family Medicine

## 2020-03-01 NOTE — Telephone Encounter (Signed)
Called pt and left vm for him to call office and get scheduled for CPE,Labs and AWV. Pt still needs to be scheduled.

## 2020-03-01 NOTE — Telephone Encounter (Signed)
E-scribed refills.  Plz schedule wellness, cpe and lab visits.  

## 2020-03-02 ENCOUNTER — Inpatient Hospital Stay: Payer: Medicare Other

## 2020-03-02 ENCOUNTER — Inpatient Hospital Stay: Payer: Medicare Other | Attending: Hematology and Oncology

## 2020-03-02 ENCOUNTER — Other Ambulatory Visit: Payer: Self-pay

## 2020-03-02 VITALS — BP 122/58

## 2020-03-02 DIAGNOSIS — D46Z Other myelodysplastic syndromes: Secondary | ICD-10-CM | POA: Diagnosis not present

## 2020-03-02 DIAGNOSIS — D469 Myelodysplastic syndrome, unspecified: Secondary | ICD-10-CM

## 2020-03-02 LAB — CBC WITH DIFFERENTIAL/PLATELET
Abs Immature Granulocytes: 0.01 10*3/uL (ref 0.00–0.07)
Basophils Absolute: 0 10*3/uL (ref 0.0–0.1)
Basophils Relative: 1 %
Eosinophils Absolute: 0.1 10*3/uL (ref 0.0–0.5)
Eosinophils Relative: 1 %
HCT: 28.2 % — ABNORMAL LOW (ref 39.0–52.0)
Hemoglobin: 9.7 g/dL — ABNORMAL LOW (ref 13.0–17.0)
Immature Granulocytes: 0 %
Lymphocytes Relative: 30 %
Lymphs Abs: 1.2 10*3/uL (ref 0.7–4.0)
MCH: 37.5 pg — ABNORMAL HIGH (ref 26.0–34.0)
MCHC: 34.4 g/dL (ref 30.0–36.0)
MCV: 108.9 fL — ABNORMAL HIGH (ref 80.0–100.0)
Monocytes Absolute: 0.3 10*3/uL (ref 0.1–1.0)
Monocytes Relative: 7 %
Neutro Abs: 2.5 10*3/uL (ref 1.7–7.7)
Neutrophils Relative %: 61 %
Platelets: 123 10*3/uL — ABNORMAL LOW (ref 150–400)
RBC: 2.59 MIL/uL — ABNORMAL LOW (ref 4.22–5.81)
RDW: 14.8 % (ref 11.5–15.5)
WBC: 4 10*3/uL (ref 4.0–10.5)
nRBC: 0 % (ref 0.0–0.2)

## 2020-03-02 MED ORDER — LUSPATERCEPT-AAMT 75 MG ~~LOC~~ SOLR
100.0000 mg | SUBCUTANEOUS | Status: AC
  Administered 2020-03-02: 100 mg via SUBCUTANEOUS
  Filled 2020-03-02: qty 2

## 2020-03-07 NOTE — Telephone Encounter (Signed)
Called patient and left voicemail for him to call office and get scheduled for cpe, labs and awv.

## 2020-03-08 NOTE — Telephone Encounter (Signed)
Called patient again to get scheduled for CPE,AWV and labs and left voicemail. Mailed out letter in order to reach patient.

## 2020-03-16 ENCOUNTER — Other Ambulatory Visit: Payer: Self-pay | Admitting: Family Medicine

## 2020-03-16 NOTE — Telephone Encounter (Signed)
Please schedule MWV with nurse and CPE with Dr. Darnell Level.

## 2020-03-23 ENCOUNTER — Inpatient Hospital Stay: Payer: Medicare Other

## 2020-03-23 ENCOUNTER — Other Ambulatory Visit: Payer: Self-pay

## 2020-03-23 ENCOUNTER — Inpatient Hospital Stay: Payer: Medicare Other | Attending: Hematology and Oncology

## 2020-03-23 DIAGNOSIS — D469 Myelodysplastic syndrome, unspecified: Secondary | ICD-10-CM

## 2020-03-23 DIAGNOSIS — D46Z Other myelodysplastic syndromes: Secondary | ICD-10-CM | POA: Insufficient documentation

## 2020-03-23 LAB — CBC WITH DIFFERENTIAL/PLATELET
Abs Immature Granulocytes: 0.02 10*3/uL (ref 0.00–0.07)
Basophils Absolute: 0 10*3/uL (ref 0.0–0.1)
Basophils Relative: 0 %
Eosinophils Absolute: 0.1 10*3/uL (ref 0.0–0.5)
Eosinophils Relative: 3 %
HCT: 27.9 % — ABNORMAL LOW (ref 39.0–52.0)
Hemoglobin: 9.7 g/dL — ABNORMAL LOW (ref 13.0–17.0)
Immature Granulocytes: 1 %
Lymphocytes Relative: 32 %
Lymphs Abs: 1.3 10*3/uL (ref 0.7–4.0)
MCH: 37.6 pg — ABNORMAL HIGH (ref 26.0–34.0)
MCHC: 34.8 g/dL (ref 30.0–36.0)
MCV: 108.1 fL — ABNORMAL HIGH (ref 80.0–100.0)
Monocytes Absolute: 0.3 10*3/uL (ref 0.1–1.0)
Monocytes Relative: 7 %
Neutro Abs: 2.3 10*3/uL (ref 1.7–7.7)
Neutrophils Relative %: 57 %
Platelets: 113 10*3/uL — ABNORMAL LOW (ref 150–400)
RBC: 2.58 MIL/uL — ABNORMAL LOW (ref 4.22–5.81)
RDW: 14.6 % (ref 11.5–15.5)
WBC: 4.1 10*3/uL (ref 4.0–10.5)
nRBC: 0 % (ref 0.0–0.2)

## 2020-03-23 MED ORDER — LUSPATERCEPT-AAMT 75 MG ~~LOC~~ SOLR
100.0000 mg | SUBCUTANEOUS | Status: DC
  Administered 2020-03-23: 100 mg via SUBCUTANEOUS
  Filled 2020-03-23: qty 0.5

## 2020-04-03 ENCOUNTER — Ambulatory Visit: Payer: Medicare Other | Admitting: Dermatology

## 2020-04-03 ENCOUNTER — Other Ambulatory Visit: Payer: Self-pay

## 2020-04-03 DIAGNOSIS — Z85828 Personal history of other malignant neoplasm of skin: Secondary | ICD-10-CM

## 2020-04-03 DIAGNOSIS — L814 Other melanin hyperpigmentation: Secondary | ICD-10-CM

## 2020-04-03 DIAGNOSIS — C44320 Squamous cell carcinoma of skin of unspecified parts of face: Secondary | ICD-10-CM

## 2020-04-03 DIAGNOSIS — C4492 Squamous cell carcinoma of skin, unspecified: Secondary | ICD-10-CM

## 2020-04-03 DIAGNOSIS — D229 Melanocytic nevi, unspecified: Secondary | ICD-10-CM | POA: Diagnosis not present

## 2020-04-03 DIAGNOSIS — R21 Rash and other nonspecific skin eruption: Secondary | ICD-10-CM

## 2020-04-03 DIAGNOSIS — Z86018 Personal history of other benign neoplasm: Secondary | ICD-10-CM

## 2020-04-03 DIAGNOSIS — Z1283 Encounter for screening for malignant neoplasm of skin: Secondary | ICD-10-CM

## 2020-04-03 DIAGNOSIS — L570 Actinic keratosis: Secondary | ICD-10-CM | POA: Diagnosis not present

## 2020-04-03 DIAGNOSIS — D1800 Hemangioma unspecified site: Secondary | ICD-10-CM | POA: Diagnosis not present

## 2020-04-03 DIAGNOSIS — C44329 Squamous cell carcinoma of skin of other parts of face: Secondary | ICD-10-CM | POA: Diagnosis not present

## 2020-04-03 DIAGNOSIS — D485 Neoplasm of uncertain behavior of skin: Secondary | ICD-10-CM

## 2020-04-03 DIAGNOSIS — L578 Other skin changes due to chronic exposure to nonionizing radiation: Secondary | ICD-10-CM

## 2020-04-03 DIAGNOSIS — L821 Other seborrheic keratosis: Secondary | ICD-10-CM

## 2020-04-03 HISTORY — DX: Squamous cell carcinoma of skin, unspecified: C44.92

## 2020-04-03 NOTE — Progress Notes (Signed)
Follow-Up Visit   Subjective  Alex Smith is a 85 y.o. male who presents for the following: Annual Exam (Patient here today for TBSE. History of BCC and dysplastic nevus. ). The patient presents for Total-Body Skin Exam (TBSE) for skin cancer screening and mole check. Patient advises there is a spot at left temple area that has been there for about 3 weeks he would like evaluated. It bleeds when he hits it shaving.   The following portions of the chart were reviewed this encounter and updated as appropriate:  Tobacco  Allergies  Meds  Problems  Med Hx  Surg Hx  Fam Hx     Review of Systems:  No other skin or systemic complaints except as noted in HPI or Assessment and Plan.  Objective  Well appearing patient in no apparent distress; mood and affect are within normal limits.  A full examination was performed including scalp, head, eyes, ears, nose, lips, neck, chest, axillae, abdomen, back, buttocks, bilateral upper extremities, bilateral lower extremities, hands, feet, fingers, toes, fingernails, and toenails. All findings within normal limits unless otherwise noted below.  Objective  Left cheek: Ulcerated crusted red papule 1.0cm  Objective  Face x 16 (16): Erythematous thin papules/macules with gritty scale.   Objective  L lat Elbow, R med pretibial, L lat pretibial: Pink patch   Assessment & Plan  Neoplasm of uncertain behavior of skin Left cheek  Destruction of lesion Complexity: extensive   Destruction method: electrodesiccation and curettage   Informed consent: discussed and consent obtained   Timeout:  patient name, date of birth, surgical site, and procedure verified Procedure prep:  Patient was prepped and draped in usual sterile fashion Prep type:  Isopropyl alcohol Anesthesia: the lesion was anesthetized in a standard fashion   Anesthetic:  1% lidocaine w/ epinephrine 1-100,000 buffered w/ 8.4% NaHCO3 Curettage performed in three different  directions: Yes   Electrodesiccation performed over the curetted area: Yes   Lesion length (cm):  1 Lesion width (cm):  1 Margin per side (cm):  0.3 Final wound size (cm):  1.6 Hemostasis achieved with:  pressure, aluminum chloride and electrodesiccation Outcome: patient tolerated procedure well with no complications   Post-procedure details: sterile dressing applied and wound care instructions given   Dressing type: bandage and petrolatum    Epidermal / dermal shaving  Lesion diameter (cm):  1 Informed consent: discussed and consent obtained   Timeout: patient name, date of birth, surgical site, and procedure verified   Procedure prep:  Patient was prepped and draped in usual sterile fashion Prep type:  Isopropyl alcohol Anesthesia: the lesion was anesthetized in a standard fashion   Anesthetic:  1% lidocaine w/ epinephrine 1-100,000 buffered w/ 8.4% NaHCO3 Instrument used: flexible razor blade   Hemostasis achieved with: pressure, aluminum chloride and electrodesiccation   Outcome: patient tolerated procedure well   Post-procedure details: sterile dressing applied and wound care instructions given   Dressing type: bandage and petrolatum    Specimen 1 - Surgical pathology Differential Diagnosis: r/o BCC Check Margins: No Ulcerated crusted red papule 1.0cm  AK (actinic keratosis) (16) Face x 16  Destruction of lesion - Face x 16 Complexity: simple   Destruction method: cryotherapy   Informed consent: discussed and consent obtained   Timeout:  patient name, date of birth, surgical site, and procedure verified Lesion destroyed using liquid nitrogen: Yes   Region frozen until ice ball extended beyond lesion: Yes   Outcome: patient tolerated procedure well with no  complications   Post-procedure details: wound care instructions given    Rash L lat Elbow, R med pretibial, L lat pretibial  Recheck on follow up  Skin cancer screening   Lentigines - Scattered tan macules -  Discussed due to sun exposure - Benign, observe - Call for any changes  Seborrheic Keratoses - Stuck-on, waxy, tan-brown papules and plaques  - Discussed benign etiology and prognosis. - Observe - Call for any changes  Melanocytic Nevi - Tan-brown and/or pink-flesh-colored symmetric macules and papules - Benign appearing on exam today - Observation - Call clinic for new or changing moles - Recommend daily use of broad spectrum spf 30+ sunscreen to sun-exposed areas.   Hemangiomas - Red papules - Discussed benign nature - Observe - Call for any changes  Actinic Damage - diffuse scaly erythematous macules with underlying dyspigmentation - Recommend daily broad spectrum sunscreen SPF 30+ to sun-exposed areas, reapply every 2 hours as needed.  - Call for new or changing lesions.  Skin cancer screening performed today.  History of Basal Cell Carcinoma of the Skin - No evidence of recurrence today - Recommend regular full body skin exams - Recommend daily broad spectrum sunscreen SPF 30+ to sun-exposed areas, reapply every 2 hours as needed.  - Call if any new or changing lesions are noted between office visits  History of Dysplastic Nevi - No evidence of recurrence today - Recommend regular full body skin exams - Recommend daily broad spectrum sunscreen SPF 30+ to sun-exposed areas, reapply every 2 hours as needed.  - Call if any new or changing lesions are noted between office visits  Return in about 3 months (around 07/04/2020) for AK follow up, recheck pink patches.  Graciella Belton, RMA, am acting as scribe for Sarina Ser, MD . Documentation: I have reviewed the above documentation for accuracy and completeness, and I agree with the above.  Sarina Ser, MD

## 2020-04-03 NOTE — Patient Instructions (Signed)

## 2020-04-06 ENCOUNTER — Encounter: Payer: Self-pay | Admitting: Dermatology

## 2020-04-06 ENCOUNTER — Telehealth: Payer: Self-pay

## 2020-04-06 NOTE — Telephone Encounter (Signed)
Patient answered the phone, but then hung up.

## 2020-04-06 NOTE — Telephone Encounter (Signed)
-----   Message from Ralene Bathe, MD sent at 04/06/2020 12:52 PM EDT ----- Skin , left cheek WELL DIFFERENTIATED SQUAMOUS CELL CARCINOMA,  Cancer - SCC Already treated Recheck next visit

## 2020-04-10 ENCOUNTER — Telehealth: Payer: Self-pay

## 2020-04-10 NOTE — Telephone Encounter (Signed)
-----   Message from Ralene Bathe, MD sent at 04/06/2020 12:52 PM EDT ----- Skin , left cheek WELL DIFFERENTIATED SQUAMOUS CELL CARCINOMA,  Cancer - SCC Already treated Recheck next visit

## 2020-04-10 NOTE — Telephone Encounter (Signed)
Discussed biopsy results with pt  °

## 2020-04-13 ENCOUNTER — Other Ambulatory Visit: Payer: Self-pay

## 2020-04-13 ENCOUNTER — Inpatient Hospital Stay: Payer: Medicare Other

## 2020-04-13 DIAGNOSIS — D469 Myelodysplastic syndrome, unspecified: Secondary | ICD-10-CM

## 2020-04-13 DIAGNOSIS — D46Z Other myelodysplastic syndromes: Secondary | ICD-10-CM | POA: Diagnosis not present

## 2020-04-13 LAB — CBC WITH DIFFERENTIAL/PLATELET
Abs Immature Granulocytes: 0.01 10*3/uL (ref 0.00–0.07)
Basophils Absolute: 0 10*3/uL (ref 0.0–0.1)
Basophils Relative: 1 %
Eosinophils Absolute: 0.1 10*3/uL (ref 0.0–0.5)
Eosinophils Relative: 2 %
HCT: 27.7 % — ABNORMAL LOW (ref 39.0–52.0)
Hemoglobin: 9.5 g/dL — ABNORMAL LOW (ref 13.0–17.0)
Immature Granulocytes: 0 %
Lymphocytes Relative: 31 %
Lymphs Abs: 1.3 10*3/uL (ref 0.7–4.0)
MCH: 37.7 pg — ABNORMAL HIGH (ref 26.0–34.0)
MCHC: 34.3 g/dL (ref 30.0–36.0)
MCV: 109.9 fL — ABNORMAL HIGH (ref 80.0–100.0)
Monocytes Absolute: 0.2 10*3/uL (ref 0.1–1.0)
Monocytes Relative: 6 %
Neutro Abs: 2.5 10*3/uL (ref 1.7–7.7)
Neutrophils Relative %: 60 %
Platelets: 116 10*3/uL — ABNORMAL LOW (ref 150–400)
RBC: 2.52 MIL/uL — ABNORMAL LOW (ref 4.22–5.81)
RDW: 14.9 % (ref 11.5–15.5)
WBC: 4.1 10*3/uL (ref 4.0–10.5)
nRBC: 0 % (ref 0.0–0.2)

## 2020-04-13 MED ORDER — LUSPATERCEPT-AAMT 75 MG ~~LOC~~ SOLR
100.0000 mg | SUBCUTANEOUS | Status: DC
  Filled 2020-04-13: qty 2

## 2020-04-13 MED ORDER — LUSPATERCEPT-AAMT 75 MG ~~LOC~~ SOLR
100.0000 mg | Freq: Once | SUBCUTANEOUS | Status: AC
  Administered 2020-04-13: 100 mg via SUBCUTANEOUS
  Filled 2020-04-13: qty 1.5

## 2020-04-14 ENCOUNTER — Ambulatory Visit: Payer: Self-pay | Attending: Internal Medicine

## 2020-04-14 DIAGNOSIS — Z23 Encounter for immunization: Secondary | ICD-10-CM

## 2020-04-14 NOTE — Progress Notes (Signed)
   Covid-19 Vaccination Clinic  Name:  Alex Smith    MRN: [id number] DOB: 30-Sep-1934  04/14/2020  [name] was observed post Covid-19 immunization for 15 minutes without incident. He was provided with Vaccine Information Sheet and instruction to access the V-Safe system.   [name] was instructed to call 911 with any severe reactions post vaccine: Marland Kitchen Difficulty breathing  . Swelling of face and throat  . A fast heartbeat  . A bad rash all over body  . Dizziness and weakness

## 2020-04-20 NOTE — Telephone Encounter (Signed)
Called patient and left voicemail for him to call office to schedule cpe and awv.

## 2020-05-05 ENCOUNTER — Inpatient Hospital Stay (HOSPITAL_BASED_OUTPATIENT_CLINIC_OR_DEPARTMENT_OTHER): Payer: Medicare Other | Admitting: Oncology

## 2020-05-05 ENCOUNTER — Other Ambulatory Visit: Payer: Self-pay

## 2020-05-05 ENCOUNTER — Inpatient Hospital Stay: Payer: Medicare Other | Attending: Oncology

## 2020-05-05 ENCOUNTER — Inpatient Hospital Stay: Payer: Medicare Other

## 2020-05-05 ENCOUNTER — Encounter: Payer: Self-pay | Admitting: Oncology

## 2020-05-05 VITALS — BP 154/68 | HR 77 | Temp 97.2°F | Resp 16 | Wt 221.9 lb

## 2020-05-05 DIAGNOSIS — D4620 Refractory anemia with excess of blasts, unspecified: Secondary | ICD-10-CM

## 2020-05-05 DIAGNOSIS — D46Z Other myelodysplastic syndromes: Secondary | ICD-10-CM

## 2020-05-05 DIAGNOSIS — C9000 Multiple myeloma not having achieved remission: Secondary | ICD-10-CM

## 2020-05-05 DIAGNOSIS — D469 Myelodysplastic syndrome, unspecified: Secondary | ICD-10-CM

## 2020-05-05 DIAGNOSIS — Z79899 Other long term (current) drug therapy: Secondary | ICD-10-CM

## 2020-05-05 DIAGNOSIS — D472 Monoclonal gammopathy: Secondary | ICD-10-CM

## 2020-05-05 LAB — COMPREHENSIVE METABOLIC PANEL
ALT: 15 U/L (ref 0–44)
AST: 17 U/L (ref 15–41)
Albumin: 3.8 g/dL (ref 3.5–5.0)
Alkaline Phosphatase: 60 U/L (ref 38–126)
Anion gap: 7 (ref 5–15)
BUN: 30 mg/dL — ABNORMAL HIGH (ref 8–23)
CO2: 25 mmol/L (ref 22–32)
Calcium: 8.5 mg/dL — ABNORMAL LOW (ref 8.9–10.3)
Chloride: 106 mmol/L (ref 98–111)
Creatinine, Ser: 1.62 mg/dL — ABNORMAL HIGH (ref 0.61–1.24)
GFR calc Af Amer: 45 mL/min — ABNORMAL LOW (ref >60)
GFR calc non Af Amer: 39 mL/min — ABNORMAL LOW (ref >60)
Glucose, Bld: 242 mg/dL — ABNORMAL HIGH (ref 70–99)
Potassium: 4.5 mmol/L (ref 3.5–5.1)
Sodium: 138 mmol/L (ref 135–145)
Total Bilirubin: 0.8 mg/dL (ref 0.3–1.2)
Total Protein: 7.7 g/dL (ref 6.5–8.1)

## 2020-05-05 LAB — CBC WITH DIFFERENTIAL/PLATELET
Abs Immature Granulocytes: 0.02 10*3/uL (ref 0.00–0.07)
Basophils Absolute: 0 10*3/uL (ref 0.0–0.1)
Basophils Relative: 1 %
Eosinophils Absolute: 0.1 10*3/uL (ref 0.0–0.5)
Eosinophils Relative: 3 %
HCT: 28.3 % — ABNORMAL LOW (ref 39.0–52.0)
Hemoglobin: 9.7 g/dL — ABNORMAL LOW (ref 13.0–17.0)
Immature Granulocytes: 1 %
Lymphocytes Relative: 32 %
Lymphs Abs: 1.1 10*3/uL (ref 0.7–4.0)
MCH: 37 pg — ABNORMAL HIGH (ref 26.0–34.0)
MCHC: 34.3 g/dL (ref 30.0–36.0)
MCV: 108 fL — ABNORMAL HIGH (ref 80.0–100.0)
Monocytes Absolute: 0.2 10*3/uL (ref 0.1–1.0)
Monocytes Relative: 6 %
Neutro Abs: 2 10*3/uL (ref 1.7–7.7)
Neutrophils Relative %: 57 %
Platelets: 117 10*3/uL — ABNORMAL LOW (ref 150–400)
RBC: 2.62 MIL/uL — ABNORMAL LOW (ref 4.22–5.81)
RDW: 15.1 % (ref 11.5–15.5)
WBC: 3.4 10*3/uL — ABNORMAL LOW (ref 4.0–10.5)
nRBC: 0 % (ref 0.0–0.2)

## 2020-05-05 MED ORDER — LUSPATERCEPT-AAMT 75 MG ~~LOC~~ SOLR
100.0000 mg | SUBCUTANEOUS | Status: AC
  Administered 2020-05-05: 100 mg via SUBCUTANEOUS
  Filled 2020-05-05: qty 0.5

## 2020-05-08 LAB — MULTIPLE MYELOMA PANEL, SERUM
Albumin SerPl Elph-Mcnc: 3.5 g/dL (ref 2.9–4.4)
Albumin/Glob SerPl: 1 (ref 0.7–1.7)
Alpha 1: 0.2 g/dL (ref 0.0–0.4)
Alpha2 Glob SerPl Elph-Mcnc: 0.8 g/dL (ref 0.4–1.0)
B-Globulin SerPl Elph-Mcnc: 0.9 g/dL (ref 0.7–1.3)
Gamma Glob SerPl Elph-Mcnc: 1.7 g/dL (ref 0.4–1.8)
Globulin, Total: 3.7 g/dL (ref 2.2–3.9)
IgA: 18 mg/dL — ABNORMAL LOW (ref 61–437)
IgG (Immunoglobin G), Serum: 1808 mg/dL — ABNORMAL HIGH (ref 603–1613)
IgM (Immunoglobulin M), Srm: 23 mg/dL (ref 15–143)
M Protein SerPl Elph-Mcnc: 1.5 g/dL — ABNORMAL HIGH
Total Protein ELP: 7.2 g/dL (ref 6.0–8.5)

## 2020-05-08 LAB — KAPPA/LAMBDA LIGHT CHAINS
Kappa free light chain: 85.2 mg/L — ABNORMAL HIGH (ref 3.3–19.4)
Kappa, lambda light chain ratio: 6.55 — ABNORMAL HIGH (ref 0.26–1.65)
Lambda free light chains: 13 mg/L (ref 5.7–26.3)

## 2020-05-09 NOTE — Progress Notes (Signed)
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 [name] and is transitioning his care to me. He has following issues:  1.Hairy cell leukemia that was initially diagnosed on bone marrow biopsy in June 2016. Bone marrow biopsy at that time showed extensive marrow involvement with recurrent hairy cell leukemia approximately 80% of the cells in the core 10% monoclonal plasma cell infiltrate compatible with plasma cell neoplasm. Peripheral smear revealed leukopenia with a white count of 1900 compatible with hairy cell leukemia.FISH studies revealed an abnormal myeloma panel with CCND1/IGH translocation t (11;14) and loss of MAF/16q. FISH studies for MDS were negative. Cytogenetics were normal (46, XY).He has last received cladribine for this back in July 2016  2.Smoldering multiple myeloma: Bone marrow biopsy in March 2017 revealed persistent plasma cell neoplasm with monoclonal plasma cells estimated at 20 to 30%. No morphologic evidence of residual hairy cell leukemia.Marrow was normocellular for age with relative erythroid hyperplasia, relative myeloid hypoplasia, mild dyspoiesis, adequate megakaryocytes and no increased  blasts. There was no significant increase in marrow reticulin fibers. Iron was present. FISH studies for MDS were negative. FISH panel for myeloma revealed CCND1/IGH translocation- t(11;14) and loss of MAF/16q. Cytogenetics were normal (46, XY).Last bone marrow biopsy was in October 2018 which showed variably cellular marrow 10 to 50% with kappa restricted plasmacytosis of 10 to 15%. No evidence of leukemia lymphoma or high-grade dysplasia. Normal cytogenetics. He has also been followed by Dr. [name] at [facility] He has received Revlimid and Decadron in the past in 2017 which was subsequently stopped. PET scan in July 2017 showed no hypermetabolic adenopathy or focal skeletal lesions.  3.Probable MDS: He has received a trial of Procrit in the past in 2017 but apparently did not respond to it. He has been getting periodic blood transfusions to maintain his hemoglobin closer to 9 as he is symptomatic when his hemoglobin is less than 9. He has been getting about 2-3 blood transfusions during the year. He does have some mild leukopenia/neutropenia and is on acyclovir prophylaxis. He gets weekly G-CSF when his ANC is less than 1.5.  4.Also has history of B12 deficiency for which he is on oral B12.    Interval history-he reports chronic fatigue and denies any new complaints.  Tolerating Luspatercept well without any significant side effects  ECOG PS- 1 Pain scale- 0   Review of systems- Review of Systems  Constitutional: Positive for malaise/fatigue. Negative for chills, fever and weight loss.  HENT: Negative for congestion, ear discharge and nosebleeds.   Eyes: Negative for blurred vision.  Respiratory: Negative for cough, hemoptysis, sputum production, shortness of breath and wheezing.   Cardiovascular: Negative for chest pain, palpitations, orthopnea and claudication.  Gastrointestinal: Negative for abdominal pain, blood in stool, constipation, diarrhea, heartburn, melena,  nausea and vomiting.  Genitourinary: Negative for dysuria, flank pain, frequency, hematuria  and urgency.  Musculoskeletal: Negative for back pain, joint pain and myalgias.  Skin: Negative for rash.  Neurological: Negative for dizziness, tingling, focal weakness, seizures, weakness and headaches.  Endo/Heme/Allergies: Does not bruise/bleed easily.  Psychiatric/Behavioral: Negative for depression and suicidal ideas. The patient does not have insomnia.       Allergies  Allergen Reactions  . Rituximab Rash    Chest tightness Chest tightness  . Blood-Group Specific Substance Other (See Comments)    Had a post transfusion reaction of red blood cells; NOW REQUIRES WASHED BLOOD CELLS Had a post transfusion reaction of red blood cells; NOW REQUIRES WASHED BLOOD CELLS  . Primaxin [Imipenem] Other (See Comments)    Possible allergy Possible allergy  . Voriconazole Other (See Comments)  . Sulfa Antibiotics Itching and Rash  . Sulfacetamide Sodium Itching and Rash     Past Medical History:  Diagnosis Date  . Anemia   . B12 deficiency   . Basal cell carcinoma 03/30/2018   left ant nasal ala  . Basal cell carcinoma of face 01/19/2013   right distal dorsum nose  . BPH (benign prostatic hypertrophy)    followed by urology, discharged (Dr. Bernardo Heater)  . CAP (community acquired pneumonia) 02/15/2015  . CKD stage 3 due to type 2 diabetes mellitus ([place]) 11/14/2017  . Colon polyps   . Diverticulosis   . Dysplastic nevus 10/27/2006   right med calf  . GERD (gastroesophageal reflux disease)   . Hairy cell leukemia ([place]) 2006   recurrent, seizure on rituxan, now on cladribine ([place])  . History of pneumonia 2000's   "once" (07/07/2012)  . History of shingles   . HLD (hyperlipidemia)   . Hypertension   . Pneumonia   . Shortness of breath dyspnea   . Systolic murmur 87/57/9728  . Type 2 diabetes, controlled, with retinopathy ([place])      Past Surgical History:  Procedure Laterality Date    . [place] VITRECTOMY WITH 20 GAUGE MVR PORT FOR MACULAR HOLE  07/07/2012   Procedure: 25 GAUGE PARS PLANA VITRECTOMY WITH 20 GAUGE MVR PORT FOR MACULAR HOLE;  Surgeon: Hayden Pedro, MD;  Location: [facility];  Service: Ophthalmology;  Laterality: Left;  . BONE MARROW BIOPSY  2016  . CARDIOVASCULAR STRESS TEST  2013   treadmill - no evidence ischemia, EF 61%  . CATARACT EXTRACTION W/ INTRAOCULAR LENS  IMPLANT, BILATERAL  ~ 2010  . COLONOSCOPY  2014   [place] WNL no rpt needed, h/o polyps  . EYE SURGERY Left 06/2012   laser surgery  . GAS INSERTION  07/07/2012   Procedure: INSERTION OF GAS;  Surgeon: Hayden Pedro, MD;  Location: [facility];  Service: Ophthalmology;  Laterality: Left;  C3F8  . PERIPHERAL VASCULAR CATHETERIZATION N/A 02/23/2015   Procedure: Glori Luis Cath Insertion;  Surgeon: Algernon Huxley, MD;  Location: [facility];  Service: Cardiovascular;  Laterality: N/A;  . PORTA CATH REMOVAL N/A 02/16/2020   Procedure: PORTA CATH REMOVAL;  Surgeon: Algernon Huxley, MD;  Location: [facility];  Service: Cardiovascular;  Laterality: N/A;  . SERUM PATCH  07/07/2012   Procedure: SERUM PATCH;  Surgeon: Hayden Pedro, MD;  Location: [facility];  Service: Ophthalmology;  Laterality: Left;  . SKIN CANCER EXCISION     "all over my face" (07/07/2012)    Social History   Socioeconomic History  . Marital status: Married    Spouse name: Not on file  . Number of children: Not  on file  . Years of education: Not on file  . Highest education level: Not on file  Occupational History  . Not on file  Tobacco Use  . Smoking status: Former Smoker    Packs/day: 2.00    Years: 25.00    Pack years: 50.00    Types: Cigarettes    Quit date: 02/06/1969    Years since quitting: 51.2  . Smokeless tobacco: Never Used  . Tobacco comment: 07/07/2012 "stopped smoking ~ 40 yr ago; smoked 20-67yr  Vaping Use  . Vaping Use: Never used  Substance and Sexual Activity  . Alcohol use: Yes     Alcohol/week: 0.0 standard drinks    Comment: rare  . Drug use: No  . Sexual activity: Never  Other Topics Concern  . Not on file  Social History Narrative   Lives at home alone. 1 cat   Occupation: was cArt gallery manager works part time for [employer]   Activity: likes to golf   Diet: moderate water, fruits/vegetables daily   Social Determinants of Health   Financial Resource Strain:   . Difficulty of Paying Living Expenses: Not on file  Food Insecurity:   . Worried About RCharity fundraiserin the Last Year: Not on file  . Ran Out of Food in the Last Year: Not on file  Transportation Needs:   . Lack of Transportation (Medical): Not on file  . Lack of Transportation (Non-Medical): Not on file  Physical Activity:   . Days of Exercise per Week: Not on file  . Minutes of Exercise per Session: Not on file  Stress:   . Feeling of Stress : Not on file  Social Connections:   . Frequency of Communication with Friends and Family: Not on file  . Frequency of Social Gatherings with Friends and Family: Not on file  . Attends Religious Services: Not on file  . Active Member of Clubs or Organizations: Not on file  . Attends CArchivistMeetings: Not on file  . Marital Status: Not on file  Intimate Partner Violence:   . Fear of Current or Ex-Partner: Not on file  . Emotionally Abused: Not on file  . Physically Abused: Not on file  . Sexually Abused: Not on file    Family History  Problem Relation Age of Onset  . Dementia Mother   . Heart failure Father 735 . Cancer Sister        breast  . Diabetes Paternal Uncle   . Diabetes Paternal Aunt   . CAD Brother 451      MI  . Stroke Neg Hx      Current Outpatient Medications:  .  aspirin EC 81 MG tablet, Take 81 mg by mouth daily., Disp: , Rfl:  .  DIPHENHYDRAMINE-ACETAMINOPHEN PO, Take 1 tablet by mouth at bedtime., Disp: , Rfl:  .  doxazosin (CARDURA) 1 MG tablet, TAKE ONE TABLET BY MOUTH EVERY DAY, Disp: 30 tablet,  Rfl: 5 .  famotidine (PEPCID) 40 MG tablet, TAKE HALF A TABLET BY MOUTH AT BEDTIME, Disp: 45 tablet, Rfl: 1 .  glucose blood (ACCU-CHEK AVIVA PLUS) test strip, USE TO CHECK SUGAR DAILY E11.319, Disp: 200 each, Rfl: 3 .  HUMULIN N 100 UNIT/ML injection, INJECT 39 UNITS SUBCUTANEOUSLY TWICE DAILY BEFORE A MEAL, Disp: 20 mL, Rfl: 5 .  HUMULIN R 100 UNIT/ML injection, INJECT 10 UNITS INTO THE SKIN TWICE DAILY WITH MEALS, Disp: 10 mL, Rfl: 5 .  hydrochlorothiazide (HYDRODIURIL)  25 MG tablet, TAKE 1 TABLET BY MOUTH DAILY, Disp: 90 tablet, Rfl: 1 .  lidocaine-prilocaine (EMLA) cream, Apply 1 application topically as needed., Disp: 30 g, Rfl: 3 .  lisinopril (ZESTRIL) 20 MG tablet, TAKE ONE TABLET BY MOUTH TWICE DAILY, Disp: 180 tablet, Rfl: 0 .  metFORMIN (GLUCOPHAGE) 1000 MG tablet, TAKE ONE TABLET BY MOUTH TWICE DAILY, Disp: 180 tablet, Rfl: 0 .  Microlet Lancets MISC, USE AS DIRECTED, Disp: 200 each, Rfl: 3 .  omeprazole (PRILOSEC) 20 MG capsule, Take 1 capsule by mouth 1 day or 1 dose., Disp: , Rfl:  .  pravastatin (PRAVACHOL) 20 MG tablet, TAKE ONE TABLET EVERY EVENING, Disp: 30 tablet, Rfl: 0 .  tamsulosin (FLOMAX) 0.4 MG CAPS capsule, TAKE 1 CAPSULE BY MOUTH EVERY DAY, Disp: 30 capsule, Rfl: 3 .  temazepam (RESTORIL) 7.5 MG capsule, Take 1 capsule by mouth daily., Disp: , Rfl:  .  traZODone (DESYREL) 50 MG tablet, TAKE 1/2 TO 1 TABLET BY MOUTH AT BEDTIMEAS NEEDED FOR SLEEP, Disp: 30 tablet, Rfl: 0 .  ULTICARE INSULIN SYRINGE 31G X 5/16" 0.5 ML MISC, See admin instructions., Disp: , Rfl:  .  vitamin B-12 (CYANOCOBALAMIN) 1000 MCG tablet, Take 1 tablet (1,000 mcg total) by mouth daily., Disp: 90 tablet, Rfl: 3 .  vitamin E 400 UNIT capsule, Take 200 Units by mouth daily. , Disp: , Rfl:  .  acyclovir (ZOVIRAX) 400 MG tablet, TAKE TWO TABLETS TWICE DAILY (Patient not taking: Reported on 11/15/2019), Disp: 120 tablet, Rfl: 3  Current Facility-Administered Medications:  .  Tbo-Filgrastim (GRANIX)  injection 480 mcg, 480 mcg, Subcutaneous, Once, [place], Melissa C, MD  Facility-Administered Medications Ordered in Other Visits:  .  heparin lock flush 100 unit/mL, 500 Units, Intravenous, Once, [place], Melissa C, MD .  luspatercept-aamt (REBLOZYL) subcutaneous injection 100 mg, 100 mg, Subcutaneous, Q30 days, Sindy Guadeloupe, MD .  luspatercept-aamt (REBLOZYL) subcutaneous injection 100 mg, 100 mg, Subcutaneous, Q30 days, Sindy Guadeloupe, MD, 100 mg at 11/18/19 1252 .  luspatercept-aamt (REBLOZYL) subcutaneous injection 100 mg, 100 mg, Subcutaneous, Q21 days, Sindy Guadeloupe, MD, 100 mg at 02/10/20 1435 .  luspatercept-aamt (REBLOZYL) subcutaneous injection 100 mg, 100 mg, Subcutaneous, Q21 days, [place], Melissa C, MD, 100 mg at 03/02/20 1452 .  luspatercept-aamt (REBLOZYL) subcutaneous injection 100 mg, 100 mg, Subcutaneous, Q21 days, [place], Melissa C, MD, 100 mg at 05/05/20 1447 .  sodium chloride 0.9 % injection 10 mL, 10 mL, Intravenous, PRN, [name], Melissa C, MD, 10 mL at 03/03/15 0903 .  sodium chloride 0.9 % injection 10 mL, 10 mL, Intracatheter, PRN, [place], Melissa C, MD, 10 mL at 03/10/15 1410 .  sodium chloride flush (NS) 0.9 % injection 10 mL, 10 mL, Intravenous, PRN, [name], Melissa C, MD, 10 mL at 07/23/18 1517  Physical exam:  Vitals:   05/05/20 1344  BP: (!) 154/68  Pulse: 77  Resp: 16  Temp: (!) 97.2 F (36.2 C)  TempSrc: Tympanic  SpO2: 98%  Weight: 221 lb 14.4 oz (100.7 kg)   Physical Exam Constitutional:      General: He is not in acute distress. Cardiovascular:     Rate and Rhythm: Normal rate and regular rhythm.     Heart sounds: Normal heart sounds.  Pulmonary:     Effort: Pulmonary effort is normal.     Breath sounds: Normal breath sounds.  Abdominal:     General: Bowel sounds are normal.     Palpations: Abdomen is soft.  Skin:  General: Skin is warm and dry.  Neurological:     Mental Status: He is alert and oriented to person, place,  and time.      CMP Latest Ref Rng & Units 05/05/2020  Glucose 70 - 99 mg/dL 242(H)  BUN 8 - 23 mg/dL 30(H)  Creatinine 0.61 - 1.24 mg/dL 1.62(H)  Sodium 135 - 145 mmol/L 138  Potassium 3.5 - 5.1 mmol/L 4.5  Chloride 98 - 111 mmol/L 106  CO2 22 - 32 mmol/L 25  Calcium 8.9 - 10.3 mg/dL 8.5(L)  Total Protein 6.5 - 8.1 g/dL 7.7  Total Bilirubin 0.3 - 1.2 mg/dL 0.8  Alkaline Phos 38 - 126 U/L 60  AST 15 - 41 U/L 17  ALT 0 - 44 U/L 15   CBC Latest Ref Rng & Units 05/05/2020  WBC 4.0 - 10.5 K/uL 3.4(L)  Hemoglobin 13.0 - 17.0 g/dL 9.7(L)  Hematocrit 39 - 52 % 28.3(L)  Platelets 150 - 400 K/uL 117(L)     Assessment and plan- Patient is a 85 y.o. male who is here for follow-up of following issues:  1.  MDS: He is currently on Luspatercept and tolerating it well without any side effects.  Hemoglobin has remained around 9 and he has not required any recent blood transfusions.  He will continue to receive that every 3 weeks.  I will see him back in 3 months with CBC with differential CMP myeloma panel TSH and serum free light chains  2.  Smoldering multiple myeloma:M protein remained stable around 1.5.  Serum free light chain ratio is also remained stable around 6.5.  Last PET scan done in 2017 and I will get a repeat PET scan to ensure stability of smoldering myeloma   Visit Diagnosis 1. Smoldering multiple myeloma ([place])      Dr. Randa Evens, MD, MPH [facility] [phone] 05/09/2020 12:52 PM

## 2020-05-22 ENCOUNTER — Other Ambulatory Visit: Payer: Self-pay | Admitting: Family Medicine

## 2020-05-22 NOTE — Telephone Encounter (Signed)
Please remove him from our f/u lists - may stop calling him as he has changed providers.

## 2020-05-22 NOTE — Telephone Encounter (Signed)
Noted  

## 2020-05-22 NOTE — Telephone Encounter (Signed)
Patient did not answer left vm for the patient to call us to let us know about change of pcp or if he is still a patient to schedule cpe and mwv.

## 2020-05-26 ENCOUNTER — Inpatient Hospital Stay: Payer: Medicare Other

## 2020-05-26 ENCOUNTER — Other Ambulatory Visit: Payer: Self-pay

## 2020-05-26 ENCOUNTER — Inpatient Hospital Stay: Payer: Medicare Other | Attending: Hematology and Oncology

## 2020-05-26 DIAGNOSIS — D46Z Other myelodysplastic syndromes: Secondary | ICD-10-CM

## 2020-05-26 DIAGNOSIS — D469 Myelodysplastic syndrome, unspecified: Secondary | ICD-10-CM

## 2020-05-26 LAB — CBC WITH DIFFERENTIAL/PLATELET
Abs Immature Granulocytes: 0.01 10*3/uL (ref 0.00–0.07)
Basophils Absolute: 0 10*3/uL (ref 0.0–0.1)
Basophils Relative: 1 %
Eosinophils Absolute: 0.1 10*3/uL (ref 0.0–0.5)
Eosinophils Relative: 3 %
HCT: 29 % — ABNORMAL LOW (ref 39.0–52.0)
Hemoglobin: 10 g/dL — ABNORMAL LOW (ref 13.0–17.0)
Immature Granulocytes: 0 %
Lymphocytes Relative: 31 %
Lymphs Abs: 1.4 10*3/uL (ref 0.7–4.0)
MCH: 36.8 pg — ABNORMAL HIGH (ref 26.0–34.0)
MCHC: 34.5 g/dL (ref 30.0–36.0)
MCV: 106.6 fL — ABNORMAL HIGH (ref 80.0–100.0)
Monocytes Absolute: 0.3 10*3/uL (ref 0.1–1.0)
Monocytes Relative: 6 %
Neutro Abs: 2.6 10*3/uL (ref 1.7–7.7)
Neutrophils Relative %: 59 %
Platelets: 119 10*3/uL — ABNORMAL LOW (ref 150–400)
RBC: 2.72 MIL/uL — ABNORMAL LOW (ref 4.22–5.81)
RDW: 14.9 % (ref 11.5–15.5)
WBC: 4.4 10*3/uL (ref 4.0–10.5)
nRBC: 0 % (ref 0.0–0.2)

## 2020-05-26 MED ORDER — LUSPATERCEPT-AAMT 75 MG ~~LOC~~ SOLR
100.0000 mg | SUBCUTANEOUS | Status: DC
  Administered 2020-05-26: 100 mg via SUBCUTANEOUS
  Filled 2020-05-26: qty 1.5

## 2020-06-16 ENCOUNTER — Inpatient Hospital Stay: Payer: Medicare Other

## 2020-06-16 ENCOUNTER — Other Ambulatory Visit: Payer: Self-pay

## 2020-06-16 DIAGNOSIS — D469 Myelodysplastic syndrome, unspecified: Secondary | ICD-10-CM

## 2020-06-16 DIAGNOSIS — D46Z Other myelodysplastic syndromes: Secondary | ICD-10-CM

## 2020-06-16 LAB — CBC WITH DIFFERENTIAL/PLATELET
Abs Immature Granulocytes: 0.01 10*3/uL (ref 0.00–0.07)
Basophils Absolute: 0 10*3/uL (ref 0.0–0.1)
Basophils Relative: 1 %
Eosinophils Absolute: 0.1 10*3/uL (ref 0.0–0.5)
Eosinophils Relative: 1 %
HCT: 27.9 % — ABNORMAL LOW (ref 39.0–52.0)
Hemoglobin: 9.4 g/dL — ABNORMAL LOW (ref 13.0–17.0)
Immature Granulocytes: 0 %
Lymphocytes Relative: 21 %
Lymphs Abs: 0.9 10*3/uL (ref 0.7–4.0)
MCH: 36.4 pg — ABNORMAL HIGH (ref 26.0–34.0)
MCHC: 33.7 g/dL (ref 30.0–36.0)
MCV: 108.1 fL — ABNORMAL HIGH (ref 80.0–100.0)
Monocytes Absolute: 0.3 10*3/uL (ref 0.1–1.0)
Monocytes Relative: 6 %
Neutro Abs: 3 10*3/uL (ref 1.7–7.7)
Neutrophils Relative %: 71 %
Platelets: 114 10*3/uL — ABNORMAL LOW (ref 150–400)
RBC: 2.58 MIL/uL — ABNORMAL LOW (ref 4.22–5.81)
RDW: 15 % (ref 11.5–15.5)
WBC: 4.2 10*3/uL (ref 4.0–10.5)
nRBC: 0 % (ref 0.0–0.2)

## 2020-06-16 MED ORDER — LUSPATERCEPT-AAMT 75 MG ~~LOC~~ SOLR
100.0000 mg | SUBCUTANEOUS | Status: DC
  Administered 2020-06-16: 100 mg via SUBCUTANEOUS
  Filled 2020-06-16: qty 1.5

## 2020-06-21 DIAGNOSIS — E11319 Type 2 diabetes mellitus with unspecified diabetic retinopathy without macular edema: Secondary | ICD-10-CM | POA: Diagnosis not present

## 2020-06-21 DIAGNOSIS — N183 Chronic kidney disease, stage 3 unspecified: Secondary | ICD-10-CM | POA: Diagnosis not present

## 2020-06-21 DIAGNOSIS — E1122 Type 2 diabetes mellitus with diabetic chronic kidney disease: Secondary | ICD-10-CM | POA: Diagnosis not present

## 2020-06-21 DIAGNOSIS — I10 Essential (primary) hypertension: Secondary | ICD-10-CM | POA: Diagnosis not present

## 2020-06-21 DIAGNOSIS — Z794 Long term (current) use of insulin: Secondary | ICD-10-CM | POA: Diagnosis not present

## 2020-06-22 ENCOUNTER — Telehealth: Payer: Self-pay | Admitting: Family Medicine

## 2020-06-23 NOTE — Telephone Encounter (Signed)
Patient is no longer with our practice. EM 06/23/20

## 2020-06-23 NOTE — Telephone Encounter (Signed)
Noted.  Called [facility] and c/x refill.

## 2020-06-23 NOTE — Telephone Encounter (Signed)
E-scribed refill.  Plz schedule wellness, cpe and lab visits. Overdue.

## 2020-06-30 ENCOUNTER — Other Ambulatory Visit: Payer: Self-pay | Admitting: Oncology

## 2020-07-03 DIAGNOSIS — E1122 Type 2 diabetes mellitus with diabetic chronic kidney disease: Secondary | ICD-10-CM | POA: Diagnosis not present

## 2020-07-03 DIAGNOSIS — Z794 Long term (current) use of insulin: Secondary | ICD-10-CM | POA: Diagnosis not present

## 2020-07-03 DIAGNOSIS — C9000 Multiple myeloma not having achieved remission: Secondary | ICD-10-CM | POA: Diagnosis not present

## 2020-07-03 DIAGNOSIS — D469 Myelodysplastic syndrome, unspecified: Secondary | ICD-10-CM | POA: Diagnosis not present

## 2020-07-03 DIAGNOSIS — C9140 Hairy cell leukemia not having achieved remission: Secondary | ICD-10-CM | POA: Diagnosis not present

## 2020-07-05 ENCOUNTER — Other Ambulatory Visit: Payer: Self-pay

## 2020-07-05 ENCOUNTER — Ambulatory Visit: Payer: Medicare Other | Admitting: Dermatology

## 2020-07-05 DIAGNOSIS — L570 Actinic keratosis: Secondary | ICD-10-CM | POA: Diagnosis not present

## 2020-07-05 DIAGNOSIS — D0471 Carcinoma in situ of skin of right lower limb, including hip: Secondary | ICD-10-CM

## 2020-07-05 DIAGNOSIS — L578 Other skin changes due to chronic exposure to nonionizing radiation: Secondary | ICD-10-CM | POA: Diagnosis not present

## 2020-07-05 DIAGNOSIS — D0472 Carcinoma in situ of skin of left lower limb, including hip: Secondary | ICD-10-CM

## 2020-07-05 DIAGNOSIS — Z85828 Personal history of other malignant neoplasm of skin: Secondary | ICD-10-CM

## 2020-07-05 DIAGNOSIS — D485 Neoplasm of uncertain behavior of skin: Secondary | ICD-10-CM

## 2020-07-05 DIAGNOSIS — D099 Carcinoma in situ, unspecified: Secondary | ICD-10-CM

## 2020-07-05 HISTORY — DX: Carcinoma in situ, unspecified: D09.9

## 2020-07-05 NOTE — Patient Instructions (Signed)

## 2020-07-07 ENCOUNTER — Other Ambulatory Visit: Payer: Self-pay

## 2020-07-07 ENCOUNTER — Inpatient Hospital Stay: Payer: Medicare Other

## 2020-07-07 ENCOUNTER — Inpatient Hospital Stay: Payer: Medicare Other | Attending: Hematology and Oncology

## 2020-07-07 VITALS — BP 109/63 | HR 72

## 2020-07-07 DIAGNOSIS — D469 Myelodysplastic syndrome, unspecified: Secondary | ICD-10-CM | POA: Diagnosis not present

## 2020-07-07 DIAGNOSIS — D46Z Other myelodysplastic syndromes: Secondary | ICD-10-CM

## 2020-07-07 LAB — CBC WITH DIFFERENTIAL/PLATELET
Abs Immature Granulocytes: 0.02 10*3/uL (ref 0.00–0.07)
Basophils Absolute: 0 10*3/uL (ref 0.0–0.1)
Basophils Relative: 1 %
Eosinophils Absolute: 0.1 10*3/uL (ref 0.0–0.5)
Eosinophils Relative: 2 %
HCT: 30.1 % — ABNORMAL LOW (ref 39.0–52.0)
Hemoglobin: 9.9 g/dL — ABNORMAL LOW (ref 13.0–17.0)
Immature Granulocytes: 0 %
Lymphocytes Relative: 31 %
Lymphs Abs: 1.5 10*3/uL (ref 0.7–4.0)
MCH: 36.5 pg — ABNORMAL HIGH (ref 26.0–34.0)
MCHC: 32.9 g/dL (ref 30.0–36.0)
MCV: 111.1 fL — ABNORMAL HIGH (ref 80.0–100.0)
Monocytes Absolute: 0.3 10*3/uL (ref 0.1–1.0)
Monocytes Relative: 6 %
Neutro Abs: 2.8 10*3/uL (ref 1.7–7.7)
Neutrophils Relative %: 60 %
Platelets: 137 10*3/uL — ABNORMAL LOW (ref 150–400)
RBC: 2.71 MIL/uL — ABNORMAL LOW (ref 4.22–5.81)
RDW: 15.8 % — ABNORMAL HIGH (ref 11.5–15.5)
WBC: 4.8 10*3/uL (ref 4.0–10.5)
nRBC: 0.6 % — ABNORMAL HIGH (ref 0.0–0.2)

## 2020-07-07 MED ORDER — LUSPATERCEPT-AAMT 75 MG ~~LOC~~ SOLR
100.0000 mg | SUBCUTANEOUS | Status: DC
  Administered 2020-07-07: 100 mg via SUBCUTANEOUS
  Filled 2020-07-07: qty 1.5

## 2020-07-10 ENCOUNTER — Encounter: Payer: Self-pay | Admitting: Dermatology

## 2020-07-10 NOTE — Progress Notes (Signed)
Follow-Up Visit   Subjective  Alex Smith is a 85 y.o. male who presents for the following: Follow-up (AK follow up of face treated with LN2 x 16. Biopsy follow up of left cheek - SCC treated with EDC at the time of the biopsy.).  The following portions of the chart were reviewed this encounter and updated as appropriate:  Tobacco  Allergies  Meds  Problems  Med Hx  Surg Hx  Fam Hx     Review of Systems:  No other skin or systemic complaints except as noted in HPI or Assessment and Plan.  Objective  Well appearing patient in no apparent distress; mood and affect are within normal limits.  A focused examination was performed including scalp, face, legs. Relevant physical exam findings are noted in the Assessment and Plan.  Objective  Left cheek: Well healed EDC site.  Objective  Face, scalp, ears (21): Erythematous thin papules/macules with gritty scale.   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 Observe for recurrence. Call clinic for new or changing lesions.  Recommend regular skin exams, daily broad-spectrum spf 30+ sunscreen use, and photoprotection.    AK (actinic keratosis) (21) Face, scalp, ears  Destruction of lesion - Face, scalp, ears Complexity: simple   Destruction method: cryotherapy   Informed consent: discussed and consent obtained   Timeout:  patient name, date of birth, surgical site, and procedure verified Lesion destroyed using liquid nitrogen: Yes   Region frozen until ice ball extended beyond lesion: Yes   Outcome: patient tolerated procedure well with no complications   Post-procedure details: wound care instructions given    Neoplasm of uncertain behavior of skin (2) Left lat pretibial  Skin / nail biopsy Type of biopsy: tangential   Informed  consent: discussed and consent obtained   Timeout: patient name, date of birth, surgical site, and procedure verified   Procedure prep:  Patient was prepped and draped in usual sterile fashion Prep type:  Isopropyl alcohol Anesthesia: the lesion was anesthetized in a standard fashion   Anesthetic:  1% lidocaine w/ epinephrine 1-100,000 buffered w/ 8.4% NaHCO3 Instrument used: flexible razor blade   Hemostasis achieved with: pressure, aluminum chloride and electrodesiccation   Outcome: patient tolerated procedure well   Post-procedure details: sterile dressing applied and wound care instructions given   Dressing type: bandage and petrolatum    Specimen 1 - Surgical pathology Differential Diagnosis: BCC vs other  Check Margins: No 2.0 x 1.5 cm crusted patch  Right medial pretibial  Skin / nail biopsy Type of biopsy: tangential   Informed consent: discussed and consent obtained   Timeout: patient name, date of birth, surgical site, and procedure verified   Procedure prep:  Patient was prepped and draped in usual sterile fashion Prep type:  Isopropyl alcohol Anesthesia: the lesion was anesthetized in a standard fashion   Anesthetic:  1% lidocaine w/ epinephrine 1-100,000 buffered w/ 8.4% NaHCO3 Instrument used: flexible razor blade   Hemostasis achieved with: pressure, aluminum chloride and electrodesiccation   Outcome: patient tolerated procedure well   Post-procedure details: sterile dressing applied and wound care instructions given   Dressing type: bandage and petrolatum    Specimen 2 - Surgical pathology Differential Diagnosis: BCC vs other  Check  Margins: No 1.2 x 1.0 cm crusted patch  Severe, Confluent Chronic Actinic Changes with Pre-Cancerous Actinic Keratoses due to cumulative sun exposure/UV radiation exposure over time - Discussed "Field Treatment" Field treatment involves treatment of an entire area of skin that has confluent Actinic Changes (Sun/ Ultraviolet light  damage) and PreCancerous Actinic Keratoses by method of PhotoDynamic Therapy (PDT) and/or prescription Topical Chemotherapy agents such as 5-fluorouracil, 5-fluorouracil/calcipotriene, and/or imiquimod.  The purpose is to decrease the number of clinically evident and subclinical PreCancerous lesions to prevent progression to development of skin cancer by chemically destroying early precancer changes that may or may not be visible.  It has been shown to reduce the risk of developing skin cancer in the treated area. As a result of treatment, redness, scaling, crusting, and open sores may occur during treatment course. One or more than one of these methods may be used and may have to be used several times to control, suppress and eliminate the PreCancerous changes. Discussed treatment course, expected reaction, and possible side effects. Will plan field tx on fu.  Actinic Damage - chronic, secondary to cumulative UV radiation exposure/sun exposure over time - diffuse scaly erythematous macules with underlying dyspigmentation - Recommend daily broad spectrum sunscreen SPF 30+ to sun-exposed areas, reapply every 2 hours as needed.  - Call for new or changing lesions.   Return in about 3 weeks (around 07/26/2020) for PDT of scalp then PDT to face in Jan 2022, follow up with Dr. Nehemiah Massed in March 2022.  I, Ashok Cordia, CMA, am acting as scribe for Sarina Ser, MD .  Documentation: I have reviewed the above documentation for accuracy and completeness, and I agree with the above.  Sarina Ser, MD

## 2020-07-11 ENCOUNTER — Telehealth: Payer: Self-pay

## 2020-07-11 NOTE — Telephone Encounter (Signed)
-----   Message from Ralene Bathe, MD sent at 07/06/2020  6:28 PM EST ----- Diagnosis 1. Skin , left lat pretibial SQUAMOUS CELL CARCINOMA IN SITU 2. Skin , right medial pretibial SQUAMOUS CELL CARCINOMA IN SITU  1&2- both cancer - SCC is Superficial Schedule for treatment (EDC)

## 2020-07-11 NOTE — Telephone Encounter (Signed)
Left message on voicemail to return my call.  

## 2020-07-11 NOTE — Telephone Encounter (Signed)
Patient advised of biopsy results and scheduled for EDC X2.

## 2020-07-14 ENCOUNTER — Other Ambulatory Visit: Payer: Self-pay | Admitting: Family Medicine

## 2020-07-18 ENCOUNTER — Telehealth: Payer: Self-pay | Admitting: *Deleted

## 2020-07-18 NOTE — Telephone Encounter (Signed)
Patient called to report that his PET scan has been cancelled due to exposure to COVID

## 2020-07-20 ENCOUNTER — Ambulatory Visit: Payer: Medicare Other

## 2020-07-26 ENCOUNTER — Telehealth: Payer: Self-pay | Admitting: *Deleted

## 2020-07-26 NOTE — Telephone Encounter (Signed)
Called the pt and wanted to know how he is doing since being exposed to covid. He is doing fine, no symptoms at all. He was around grand daughter on thanksgiving day. This Friday is the day of his appt and it will be 14 days. He should keep his appt. For that date unless he starts having symptoms. He is agreeable. Also his pet scan was rescheduled and we will give him the schedule when he comes

## 2020-07-27 ENCOUNTER — Other Ambulatory Visit: Payer: Self-pay | Admitting: *Deleted

## 2020-07-27 DIAGNOSIS — D4620 Refractory anemia with excess of blasts, unspecified: Secondary | ICD-10-CM

## 2020-07-28 ENCOUNTER — Inpatient Hospital Stay: Payer: Medicare Other | Attending: Oncology

## 2020-07-28 ENCOUNTER — Inpatient Hospital Stay (HOSPITAL_BASED_OUTPATIENT_CLINIC_OR_DEPARTMENT_OTHER): Payer: Medicare Other | Admitting: Oncology

## 2020-07-28 ENCOUNTER — Inpatient Hospital Stay: Payer: Medicare Other

## 2020-07-28 ENCOUNTER — Encounter: Payer: Self-pay | Admitting: Oncology

## 2020-07-28 VITALS — BP 155/73 | HR 88 | Temp 99.8°F | Resp 18 | Wt 224.7 lb

## 2020-07-28 DIAGNOSIS — Z79899 Other long term (current) drug therapy: Secondary | ICD-10-CM

## 2020-07-28 DIAGNOSIS — C9000 Multiple myeloma not having achieved remission: Secondary | ICD-10-CM

## 2020-07-28 DIAGNOSIS — D46Z Other myelodysplastic syndromes: Secondary | ICD-10-CM

## 2020-07-28 DIAGNOSIS — D472 Monoclonal gammopathy: Secondary | ICD-10-CM

## 2020-07-28 DIAGNOSIS — D469 Myelodysplastic syndrome, unspecified: Secondary | ICD-10-CM

## 2020-07-28 DIAGNOSIS — D4620 Refractory anemia with excess of blasts, unspecified: Secondary | ICD-10-CM

## 2020-07-28 LAB — CBC WITH DIFFERENTIAL/PLATELET
Abs Immature Granulocytes: 0.01 10*3/uL (ref 0.00–0.07)
Basophils Absolute: 0 10*3/uL (ref 0.0–0.1)
Basophils Relative: 0 %
Eosinophils Absolute: 0.1 10*3/uL (ref 0.0–0.5)
Eosinophils Relative: 3 %
HCT: 28.9 % — ABNORMAL LOW (ref 39.0–52.0)
Hemoglobin: 9.7 g/dL — ABNORMAL LOW (ref 13.0–17.0)
Immature Granulocytes: 0 %
Lymphocytes Relative: 23 %
Lymphs Abs: 0.9 10*3/uL (ref 0.7–4.0)
MCH: 36.9 pg — ABNORMAL HIGH (ref 26.0–34.0)
MCHC: 33.6 g/dL (ref 30.0–36.0)
MCV: 109.9 fL — ABNORMAL HIGH (ref 80.0–100.0)
Monocytes Absolute: 0.3 10*3/uL (ref 0.1–1.0)
Monocytes Relative: 7 %
Neutro Abs: 2.6 10*3/uL (ref 1.7–7.7)
Neutrophils Relative %: 67 %
Platelets: 121 10*3/uL — ABNORMAL LOW (ref 150–400)
RBC: 2.63 MIL/uL — ABNORMAL LOW (ref 4.22–5.81)
RDW: 15.1 % (ref 11.5–15.5)
WBC: 3.9 10*3/uL — ABNORMAL LOW (ref 4.0–10.5)
nRBC: 0 % (ref 0.0–0.2)

## 2020-07-28 LAB — COMPREHENSIVE METABOLIC PANEL
ALT: 15 U/L (ref 0–44)
AST: 19 U/L (ref 15–41)
Albumin: 3.7 g/dL (ref 3.5–5.0)
Alkaline Phosphatase: 60 U/L (ref 38–126)
Anion gap: 8 (ref 5–15)
BUN: 26 mg/dL — ABNORMAL HIGH (ref 8–23)
CO2: 26 mmol/L (ref 22–32)
Calcium: 8.8 mg/dL — ABNORMAL LOW (ref 8.9–10.3)
Chloride: 103 mmol/L (ref 98–111)
Creatinine, Ser: 1.49 mg/dL — ABNORMAL HIGH (ref 0.61–1.24)
GFR, Estimated: 46 mL/min — ABNORMAL LOW (ref >60)
Glucose, Bld: 249 mg/dL — ABNORMAL HIGH (ref 70–99)
Potassium: 4.1 mmol/L (ref 3.5–5.1)
Sodium: 137 mmol/L (ref 135–145)
Total Bilirubin: 0.7 mg/dL (ref 0.3–1.2)
Total Protein: 7.2 g/dL (ref 6.5–8.1)

## 2020-07-28 LAB — TSH: TSH: 2.388 u[IU]/mL (ref 0.350–4.500)

## 2020-07-28 MED ORDER — LUSPATERCEPT-AAMT 75 MG ~~LOC~~ SOLR
100.0000 mg | SUBCUTANEOUS | Status: AC
  Administered 2020-07-28: 100 mg via SUBCUTANEOUS
  Filled 2020-07-28: qty 0.5

## 2020-07-28 NOTE — Progress Notes (Signed)
Pt doing ok but now he is being watched from nephrology because creatinine increased. He does state that he is fatigued.

## 2020-07-31 LAB — MULTIPLE MYELOMA PANEL, SERUM
Albumin SerPl Elph-Mcnc: 3.5 g/dL (ref 2.9–4.4)
Albumin/Glob SerPl: 1.1 (ref 0.7–1.7)
Alpha 1: 0.2 g/dL (ref 0.0–0.4)
Alpha2 Glob SerPl Elph-Mcnc: 0.7 g/dL (ref 0.4–1.0)
B-Globulin SerPl Elph-Mcnc: 0.8 g/dL (ref 0.7–1.3)
Gamma Glob SerPl Elph-Mcnc: 1.6 g/dL (ref 0.4–1.8)
Globulin, Total: 3.3 g/dL (ref 2.2–3.9)
IgA: 19 mg/dL — ABNORMAL LOW (ref 61–437)
IgG (Immunoglobin G), Serum: 1812 mg/dL — ABNORMAL HIGH (ref 603–1613)
IgM (Immunoglobulin M), Srm: 24 mg/dL (ref 15–143)
M Protein SerPl Elph-Mcnc: 1.5 g/dL — ABNORMAL HIGH
Total Protein ELP: 6.8 g/dL (ref 6.0–8.5)

## 2020-07-31 LAB — KAPPA/LAMBDA LIGHT CHAINS
Kappa free light chain: 82.1 mg/L — ABNORMAL HIGH (ref 3.3–19.4)
Kappa, lambda light chain ratio: 6.27 — ABNORMAL HIGH (ref 0.26–1.65)
Lambda free light chains: 13.1 mg/L (ref 5.7–26.3)

## 2020-08-02 ENCOUNTER — Ambulatory Visit: Payer: Medicare Other

## 2020-08-02 ENCOUNTER — Other Ambulatory Visit: Payer: Self-pay

## 2020-08-02 DIAGNOSIS — L570 Actinic keratosis: Secondary | ICD-10-CM

## 2020-08-02 NOTE — Progress Notes (Signed)
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 [name] and is transitioning his care to me. He has following issues:  1.Hairy cell leukemia that was initially diagnosed on bone marrow biopsy in June 2016. Bone marrow biopsy at that time showed extensive marrow involvement with recurrent hairy cell leukemia approximately 80% of the cells in the core 10% monoclonal plasma cell infiltrate compatible with plasma cell neoplasm. Peripheral smear revealed leukopenia with a white count of 1900 compatible with hairy cell leukemia.FISH studies revealed an abnormal myeloma panel with CCND1/IGH translocation t (11;14) and loss of MAF/16q. FISH studies for MDS were negative. Cytogenetics were normal (46, XY).He has last received cladribine for this back in July 2016  2.Smoldering multiple myeloma: Bone marrow biopsy in March 2017 revealed persistent plasma cell neoplasm with monoclonal plasma cells estimated at 20 to 30%. No morphologic evidence of residual hairy cell leukemia.Marrow was normocellular for age with relative erythroid hyperplasia, relative myeloid hypoplasia, mild dyspoiesis, adequate megakaryocytes and no increased  blasts. There was no significant increase in marrow reticulin fibers. Iron was present. FISH studies for MDS were negative. FISH panel for myeloma revealed CCND1/IGH translocation- t(11;14) and loss of MAF/16q. Cytogenetics were normal (46, XY).Last bone marrow biopsy was in October 2018 which showed variably cellular marrow 10 to 50% with kappa restricted plasmacytosis of 10 to 15%. No evidence of leukemia lymphoma or high-grade dysplasia. Normal cytogenetics. He has also been followed by Dr. [name] at [facility]. He has received Revlimid and Decadron in the past in 2017 which was subsequently stopped. PET scan in July 2017 showed no hypermetabolic adenopathy or focal skeletal lesions.  3.Probable MDS: He has received a trial of Procrit in the past in 2017 but apparently did not respond to it. He has been getting periodic blood transfusions to maintain his hemoglobin closer to 9 as he is symptomatic when his hemoglobin is less than 9. He has been getting about 2-3 blood transfusions during the year. He does have some mild leukopenia/neutropenia and is on acyclovir prophylaxis. He gets weekly G-CSF when his ANC is less than 1.5.  4.Also has history of B12 deficiency for which he is on oral B12.   Interval history-patient reports chronic fatigue.  Denies any nausea vomiting diarrhea or shortness of breath.  ECOG PS- 1 Pain scale- 0   Review of systems- Review of Systems  Constitutional: Positive for malaise/fatigue. Negative for chills, fever and weight loss.  HENT: Negative for congestion, ear discharge and nosebleeds.   Eyes: Negative for blurred vision.  Respiratory: Negative for cough, hemoptysis, sputum production, shortness of breath and wheezing.   Cardiovascular: Negative for chest pain, palpitations, orthopnea and claudication.  Gastrointestinal: Negative for abdominal pain, blood in stool, constipation, diarrhea, heartburn, melena, nausea and vomiting.   Genitourinary: Negative for dysuria, flank pain, frequency, hematuria and urgency.  Musculoskeletal: Negative  for back pain, joint pain and myalgias.  Skin: Negative for rash.  Neurological: Negative for dizziness, tingling, focal weakness, seizures, weakness and headaches.  Endo/Heme/Allergies: Does not bruise/bleed easily.  Psychiatric/Behavioral: Negative for depression and suicidal ideas. The patient does not have insomnia.       Allergies  Allergen Reactions  . Rituximab Rash    Chest tightness Chest tightness  . Blood-Group Specific Substance Other (See Comments)    Had a post transfusion reaction of red blood cells; NOW REQUIRES WASHED BLOOD CELLS Had a post transfusion reaction of red blood cells; NOW REQUIRES WASHED BLOOD CELLS  . Primaxin [Imipenem] Other (See Comments)    Possible allergy Possible allergy  . Voriconazole Other (See Comments)  . Sulfa Antibiotics Itching and Rash  . Sulfacetamide Sodium Itching and Rash     Past Medical History:  Diagnosis Date  . Anemia   . B12 deficiency   . Basal cell carcinoma 03/30/2018   left ant nasal ala  . Basal cell carcinoma of face 01/19/2013   right distal dorsum nose  . BPH (benign prostatic hypertrophy)    followed by urology, discharged (Dr. Bernardo Heater)  . CAP (community acquired pneumonia) 02/15/2015  . CKD stage 3 due to type 2 diabetes mellitus ([place]) 11/14/2017  . Colon polyps   . Diverticulosis   . Dysplastic nevus 10/27/2006   right med calf  . GERD (gastroesophageal reflux disease)   . Hairy cell leukemia ([place]) 2006   recurrent, seizure on rituxan, now on cladribine ([place])  . History of pneumonia 2000's   "once" (07/07/2012)  . History of shingles   . HLD (hyperlipidemia)   . Hypertension   . Pneumonia   . Shortness of breath dyspnea   . Squamous cell carcinoma in situ 07/05/2020   Left lat. pretibial.   . Squamous cell carcinoma in situ 07/05/2020   Right medial pretibial.   . Squamous cell  carcinoma of skin 04/03/2020   Left cheek. WD SCC  . Systolic murmur 64/15/8309  . Type 2 diabetes, controlled, with retinopathy ([place])      Past Surgical History:  Procedure Laterality Date  . [place] VITRECTOMY WITH 20 GAUGE MVR PORT FOR MACULAR HOLE  07/07/2012   Procedure: 25 GAUGE PARS PLANA VITRECTOMY WITH 20 GAUGE MVR PORT FOR MACULAR HOLE;  Surgeon: Hayden Pedro, MD;  Location: [place];  Service: Ophthalmology;  Laterality: Left;  . BONE MARROW BIOPSY  2016  . CARDIOVASCULAR STRESS TEST  2013   treadmill - no evidence ischemia, EF 61%  . CATARACT EXTRACTION W/ INTRAOCULAR LENS  IMPLANT, BILATERAL  ~ 2010  . COLONOSCOPY  2014   [place] WNL no rpt needed, h/o polyps  . EYE SURGERY Left 06/2012   laser surgery  . GAS INSERTION  07/07/2012   Procedure: INSERTION OF GAS;  Surgeon: Hayden Pedro, MD;  Location: [place];  Service: Ophthalmology;  Laterality: Left;  C3F8  . PERIPHERAL VASCULAR CATHETERIZATION N/A 02/23/2015   Procedure: Glori Luis Cath Insertion;  Surgeon: Algernon Huxley, MD;  Location: [place] [facility];  Service: Cardiovascular;  Laterality: N/A;  . PORTA CATH REMOVAL N/A 02/16/2020   Procedure: PORTA CATH REMOVAL;  Surgeon: Algernon Huxley, MD;  Location: [place] [facility];  Service: Cardiovascular;  Laterality: N/A;  . SERUM PATCH  07/07/2012   Procedure: SERUM PATCH;  Surgeon: Hayden Pedro, MD;  Location: [place];  Service: Ophthalmology;  Laterality: Left;  . SKIN CANCER EXCISION     "  all over my face" (07/07/2012)    Social History   Socioeconomic History  . Marital status: Married    Spouse name: Not on file  . Number of children: Not on file  . Years of education: Not on file  . Highest education level: Not on file  Occupational History  . Not on file  Tobacco Use  . Smoking status: Former Smoker    Packs/day: 2.00    Years: 25.00    Pack years: 50.00    Types: Cigarettes    Quit date: 02/06/1969    Years since quitting: 51.5  . Smokeless  tobacco: Never Used  . Tobacco comment: 07/07/2012 "stopped smoking ~ 40 yr ago; smoked 20-60yr  Vaping Use  . Vaping Use: Never used  Substance and Sexual Activity  . Alcohol use: Yes    Alcohol/week: 0.0 standard drinks    Comment: rare  . Drug use: No  . Sexual activity: Never  Other Topics Concern  . Not on file  Social History Narrative   Lives at home alone. 1 cat   Occupation: was cArt gallery manager works part time for [employer]   Activity: likes to golf   Diet: moderate water, fruits/vegetables daily   Social Determinants of HRadio broadcast assistantStrain: Not on file  Food Insecurity: Not on file  Transportation Needs: Not on file  Physical Activity: Not on file  Stress: Not on file  Social Connections: Not on file  Intimate Partner Violence: Not on file    Family History  Problem Relation Age of Onset  . Dementia Mother   . Heart failure Father 73 . Cancer Sister        breast  . Diabetes Paternal Uncle   . Diabetes Paternal Aunt   . CAD Brother 470      MI  . Stroke Neg Hx      Current Outpatient Medications:  .  aspirin EC 81 MG tablet, Take 81 mg by mouth daily., Disp: , Rfl:  .  DIPHENHYDRAMINE-ACETAMINOPHEN PO, Take 1 tablet by mouth at bedtime., Disp: , Rfl:  .  doxazosin (CARDURA) 1 MG tablet, TAKE ONE TABLET BY MOUTH EVERY DAY, Disp: 30 tablet, Rfl: 5 .  famotidine (PEPCID) 40 MG tablet, Take 0.5 tablets (20 mg total) by mouth daily. NEEDS OFFICE VISIT, Disp: 15 tablet, Rfl: 0 .  glucose blood (ACCU-CHEK AVIVA PLUS) test strip, USE TO CHECK SUGAR DAILY E11.319, Disp: 200 each, Rfl: 3 .  HUMULIN N 100 UNIT/ML injection, INJECT 39 UNITS SUBCUTANEOUSLY TWICE DAILY BEFORE A MEAL (Patient taking differently: Inject 1 Units into the skin as directed. Pt uses this as sliding scale), Disp: 20 mL, Rfl: 2 .  HUMULIN R 100 UNIT/ML injection, INJECT 10 UNITS INTO THE SKIN TWICE DAILY WITH MEALS, Disp: 10 mL, Rfl: 5 .  hydrochlorothiazide (HYDRODIURIL) 25  MG tablet, TAKE 1 TABLET BY MOUTH DAILY, Disp: 90 tablet, Rfl: 1 .  Microlet Lancets MISC, USE AS DIRECTED, Disp: 200 each, Rfl: 3 .  pravastatin (PRAVACHOL) 20 MG tablet, TAKE ONE TABLET EVERY EVENING, Disp: 30 tablet, Rfl: 0 .  tamsulosin (FLOMAX) 0.4 MG CAPS capsule, TAKE 1 CAPSULE BY MOUTH EVERY DAY, Disp: 30 capsule, Rfl: 3 .  temazepam (RESTORIL) 7.5 MG capsule, Take 1 capsule by mouth daily., Disp: , Rfl:  .  traZODone (DESYREL) 50 MG tablet, TAKE 1/2 TO 1 TABLET BY MOUTH AT BEDTIMEAS NEEDED FOR SLEEP, Disp: 30 tablet, Rfl: 0 .  ULTICARE INSULIN  SYRINGE 31G X 5/16" 0.5 ML MISC, See admin instructions., Disp: , Rfl:  .  vitamin B-12 (CYANOCOBALAMIN) 1000 MCG tablet, Take 1 tablet (1,000 mcg total) by mouth daily., Disp: 90 tablet, Rfl: 3 .  vitamin E 400 UNIT capsule, Take 200 Units by mouth daily. , Disp: , Rfl:  .  lisinopril (ZESTRIL) 20 MG tablet, TAKE ONE TABLET BY MOUTH TWICE DAILY (Patient not taking: Reported on 07/28/2020), Disp: 180 tablet, Rfl: 0 .  omeprazole (PRILOSEC) 20 MG capsule, Take 1 capsule by mouth 1 day or 1 dose., Disp: , Rfl:   Current Facility-Administered Medications:  .  Tbo-Filgrastim (GRANIX) injection 480 mcg, 480 mcg, Subcutaneous, Once, [place], Melissa C, MD  Facility-Administered Medications Ordered in Other Visits:  .  heparin lock flush 100 unit/mL, 500 Units, Intravenous, Once, [place], Melissa C, MD .  luspatercept-aamt (REBLOZYL) subcutaneous injection 100 mg, 100 mg, Subcutaneous, Q30 days, Sindy Guadeloupe, MD .  luspatercept-aamt (REBLOZYL) subcutaneous injection 100 mg, 100 mg, Subcutaneous, Q30 days, Sindy Guadeloupe, MD, 100 mg at 11/18/19 1252 .  luspatercept-aamt (REBLOZYL) subcutaneous injection 100 mg, 100 mg, Subcutaneous, Q21 days, Sindy Guadeloupe, MD, 100 mg at 02/10/20 1435 .  luspatercept-aamt (REBLOZYL) subcutaneous injection 100 mg, 100 mg, Subcutaneous, Q21 days, [place], Melissa C, MD, 100 mg at 03/02/20 1452 .  luspatercept-aamt  (REBLOZYL) subcutaneous injection 100 mg, 100 mg, Subcutaneous, Q21 days, [place], Melissa C, MD, 100 mg at 05/05/20 1447 .  luspatercept-aamt (REBLOZYL) subcutaneous injection 100 mg, 100 mg, Subcutaneous, Q21 days, [place], Melissa C, MD, 100 mg at 07/28/20 1447 .  sodium chloride 0.9 % injection 10 mL, 10 mL, Intravenous, PRN, [name], Melissa C, MD, 10 mL at 03/03/15 0903 .  sodium chloride 0.9 % injection 10 mL, 10 mL, Intracatheter, PRN, [name], Melissa C, MD, 10 mL at 03/10/15 1410 .  sodium chloride flush (NS) 0.9 % injection 10 mL, 10 mL, Intravenous, PRN, [name], Melissa C, MD, 10 mL at 07/23/18 1517  Physical exam:  Vitals:   07/28/20 1324  BP: (!) 155/73  Pulse: 88  Resp: 18  Temp: 99.8 F (37.7 C)  TempSrc: Tympanic  SpO2: 96%  Weight: 224 lb 11.2 oz (101.9 kg)   Physical Exam Constitutional:      General: He is not in acute distress. Eyes:     Extraocular Movements: EOM normal.  Cardiovascular:     Rate and Rhythm: Normal rate and regular rhythm.     Heart sounds: Normal heart sounds.  Pulmonary:     Effort: Pulmonary effort is normal.     Breath sounds: Normal breath sounds.  Abdominal:     General: Bowel sounds are normal.     Palpations: Abdomen is soft.  Skin:    General: Skin is warm and dry.  Neurological:     Mental Status: He is alert and oriented to person, place, and time.      CMP Latest Ref Rng & Units 07/28/2020  Glucose 70 - 99 mg/dL 249(H)  BUN 8 - 23 mg/dL 26(H)  Creatinine 0.61 - 1.24 mg/dL 1.49(H)  Sodium 135 - 145 mmol/L 137  Potassium 3.5 - 5.1 mmol/L 4.1  Chloride 98 - 111 mmol/L 103  CO2 22 - 32 mmol/L 26  Calcium 8.9 - 10.3 mg/dL 8.8(L)  Total Protein 6.5 - 8.1 g/dL 7.2  Total Bilirubin 0.3 - 1.2 mg/dL 0.7  Alkaline Phos 38 - 126 U/L 60  AST 15 - 41 U/L 19  ALT 0 - 44 U/L  15   CBC Latest Ref Rng & Units 07/28/2020  WBC 4.0 - 10.5 K/uL 3.9(L)  Hemoglobin 13.0 - 17.0 g/dL 9.7(L)  Hematocrit 39.0 - 52.0 % 28.9(L)   Platelets 150 - 400 K/uL 121(L)      Assessment and plan- Patient is a 85 y.o. male who is here for follow-up of following issues:  1.  Smoldering multiple myeloma:Myeloma panel currently shows a stable M protein of around 1.5 IgG kappa.  Serum free light chain ratio has remained stable between 4-6.  He is scheduled for a surveillance PET scan on 08/03/2020.  2.  MDS: Patient is tolerating Luspatercept well and will continue at present dose every 3 weeks and I will see him back in 3 months.   Visit Diagnosis 1. Smoldering multiple myeloma ([place])   2. MDS (myelodysplastic syndrome) ([place])   3. High risk medication use      Dr. Randa Evens, MD, MPH [facility] [phone] 08/02/2020 6:12 PM

## 2020-08-02 NOTE — Patient Instructions (Signed)

## 2020-08-03 ENCOUNTER — Encounter
Admission: RE | Admit: 2020-08-03 | Discharge: 2020-08-03 | Disposition: A | Discharge status: Home / Self Care | Payer: Medicare Other | Source: Ambulatory Visit | Attending: Oncology | Admitting: Oncology

## 2020-08-03 DIAGNOSIS — C9000 Multiple myeloma not having achieved remission: Secondary | ICD-10-CM | POA: Diagnosis not present

## 2020-08-03 DIAGNOSIS — D472 Monoclonal gammopathy: Secondary | ICD-10-CM

## 2020-08-03 DIAGNOSIS — I700 Atherosclerosis of aorta: Secondary | ICD-10-CM | POA: Diagnosis not present

## 2020-08-03 DIAGNOSIS — K8020 Calculus of gallbladder without cholecystitis without obstruction: Secondary | ICD-10-CM | POA: Diagnosis not present

## 2020-08-03 LAB — GLUCOSE, CAPILLARY: Glucose-Capillary: 122 mg/dL — ABNORMAL HIGH (ref 70–99)

## 2020-08-03 IMAGING — CT NM PET TUM IMG INITIAL (PI) WHOLE BODY
9 series · 25 of 25 positions shown · non-contrast
Comparison: PET-CT [DATE],  CT [DATE]

CLINICAL DATA: Subsequent treatment strategy for multiple myeloma.
Smoldering myeloma.

EXAM:
NUCLEAR MEDICINE PET WHOLE BODY
TECHNIQUE: 11.2 mCi F-18 FDG was injected intravenously. Full-ring PET imaging
was performed from the head to foot after the radiotracer. CT data
was obtained and used for attenuation correction and anatomic
localization.
Fasting blood glucose: 122 mg/dl

[Series 3: ct wb 5.0 b30f · axial · 5.0mm · 0.98mm/px · z∈[-696,+1110]mm · 5 of 602 slices shown]
[im 1/602  soft-tissue]
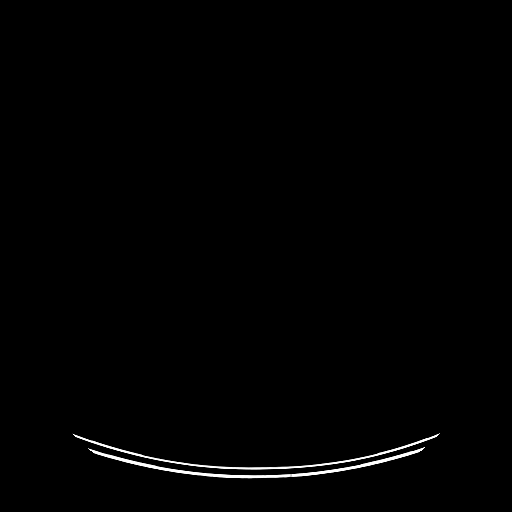
[im 151/602  soft-tissue]
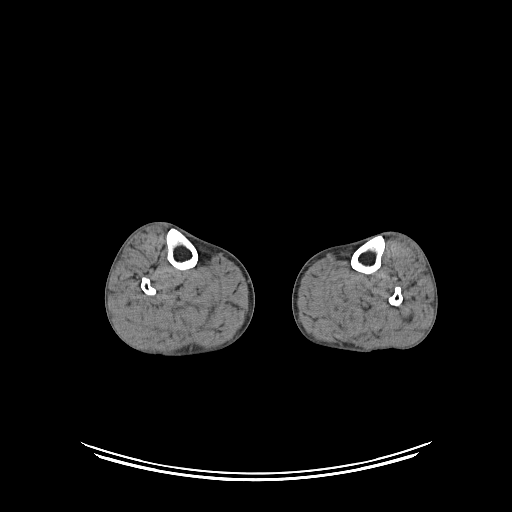
[im 301/602  soft-tissue]
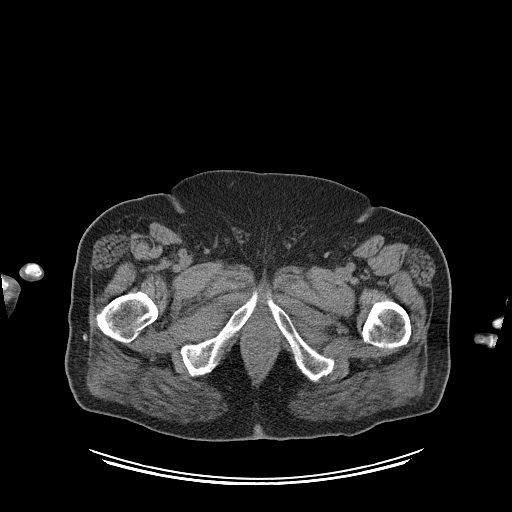
[im 451/602  soft-tissue]
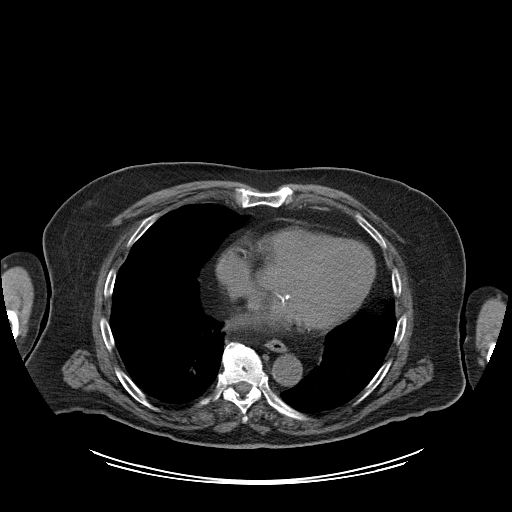
[im 602/602  soft-tissue]
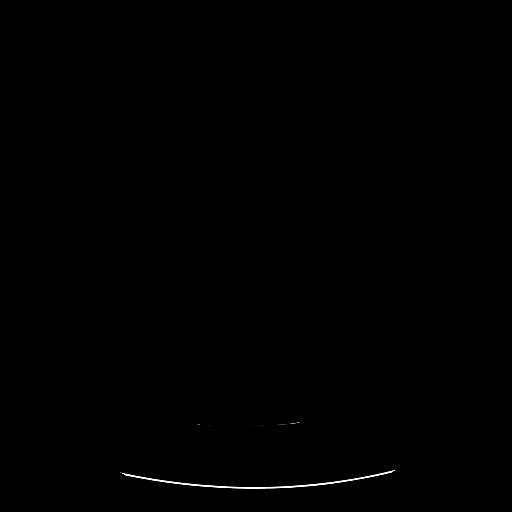

[Series 5: pet wb uncorrected (nac) · axial · 5.0mm · 4.07mm/px · z∈[-696,+1110]mm · 4 of 602 slices shown]
[im 1/602]
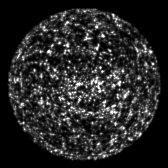
[im 201/602]
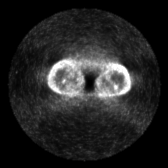
[im 401/602]
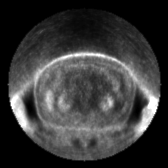
[im 602/602]
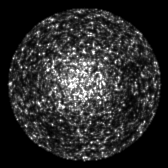

[Series 6: pet wb (ac) · axial · 5.0mm · 4.07mm/px · z∈[-696,+1110]mm · 4 of 603 slices shown]
[im 1/603]
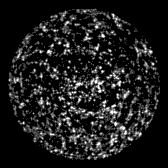
[im 201/603]
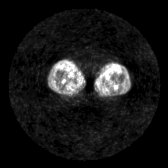
[im 402/603]
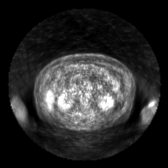
[im 603/603]
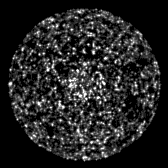

[Series 603: pet_ct axial fused · 4 of 601 slices shown]
[im 1/601]
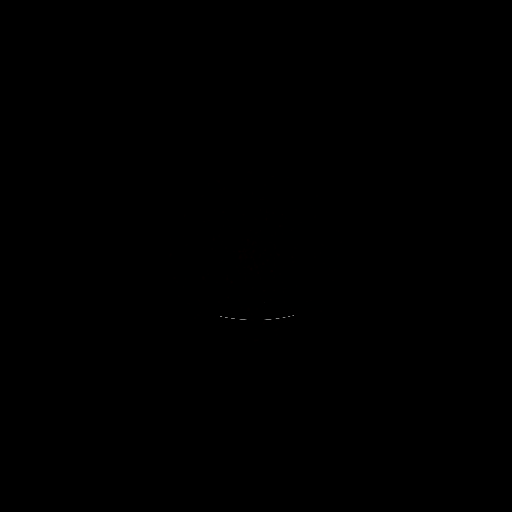
[im 201/601]
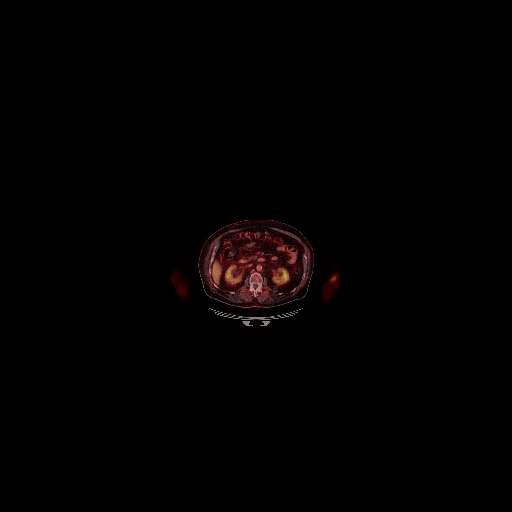
[im 401/601]
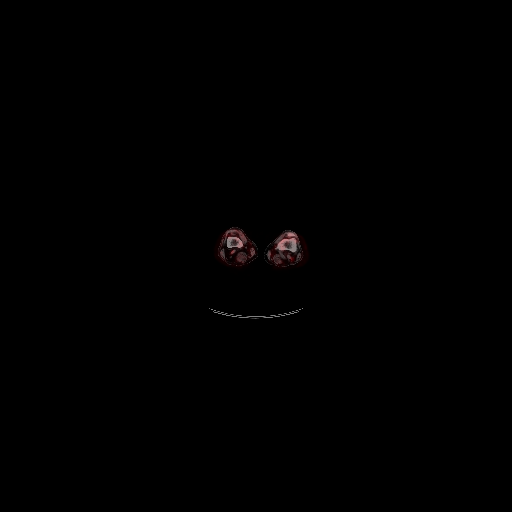
[im 601/601]
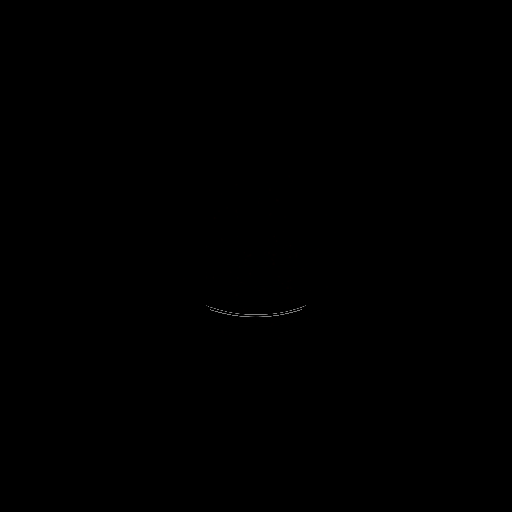

[Series 604: pet_ct coronal fused · 1 of 62 slices shown]
[im 1/62]
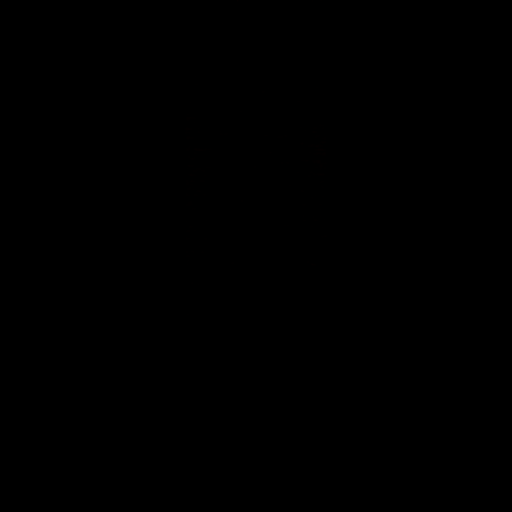

[Series 605: pet_ct sagittal fused · 1 of 205 slices shown]
[im 1/205]
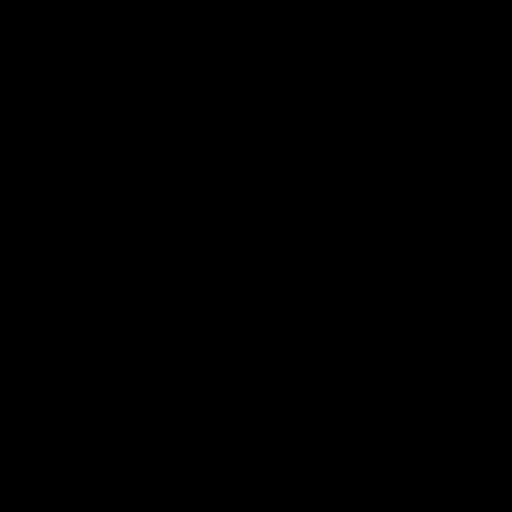

[Series 606: pet axial · 4 of 590 slices shown]
[im 1/590]
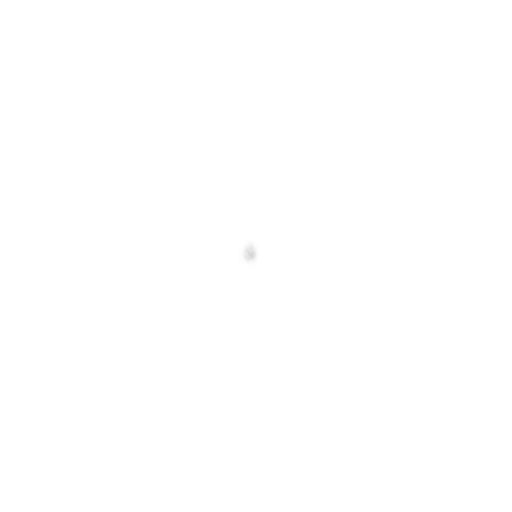
[im 197/590]
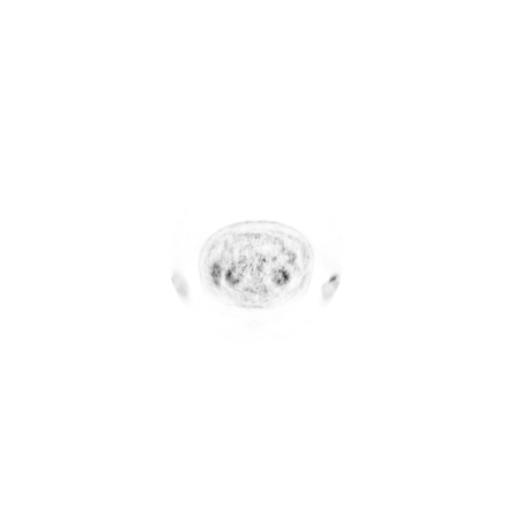
[im 393/590]
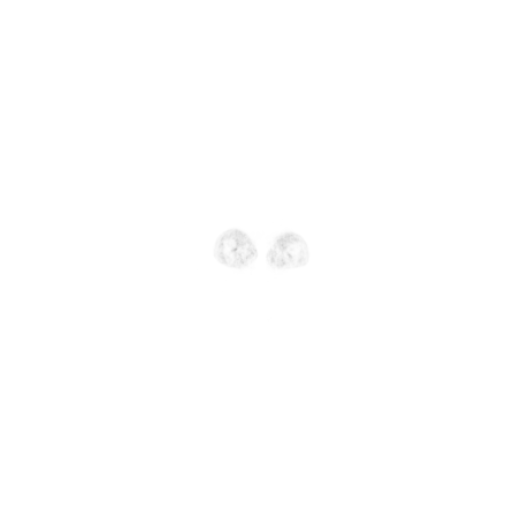
[im 590/590]
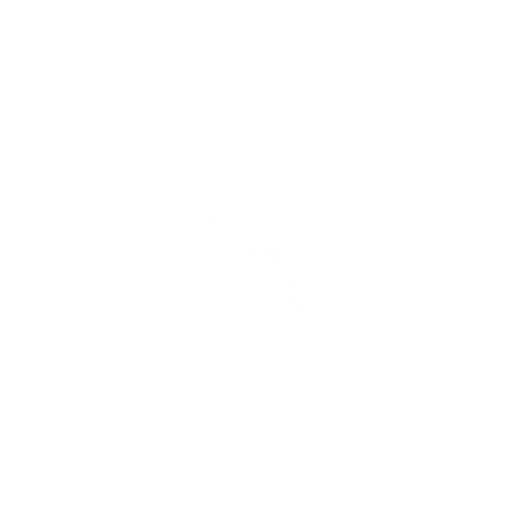

[Series 607: pet coronal · 1 of 133 slices shown]
[im 1/133]
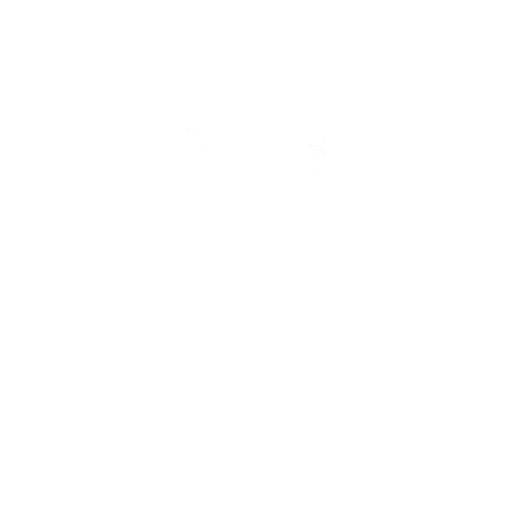

[Series 608: pet sagittal · 1 of 201 slices shown]
[im 1/201]
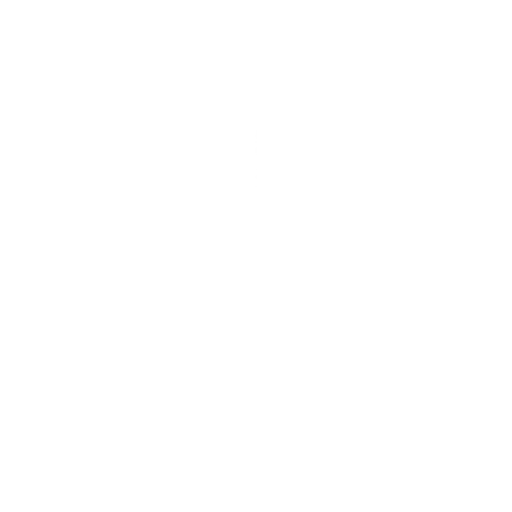

[25 of 25 positions shown; findings below may reference images not displayed]

FINDINGS: Mediastinal blood pool activity: SUV max

HEAD/NECK: No hypermetabolic activity in the scalp. No
hypermetabolic cervical lymph nodes.

Incidental CT findings: none

CHEST: No hypermetabolic mediastinal or hilar nodes. No suspicious
pulmonary nodules on the CT scan.

Incidental CT findings: none

ABDOMEN/PELVIS: No abnormal hypermetabolic activity within the
liver, pancreas, adrenal glands, or spleen. No hypermetabolic lymph
nodes in the abdomen or pelvis.

Incidental CT findings: Atherosclerotic calcification of the aorta.
Gallstones noted.

SKELETON: No focal metabolic activity within the skeleton to suggest
multiple myeloma. Uptake in the LEFT and RIGHT hip is favored
degenerative.

Incidental CT findings: No evidence of lytic lesions on the CT
portion exam.

EXTREMITIES: No abnormal hypermetabolic activity in the lower
extremities.

Incidental CT findings: No lytic lesions on CT portion exam.
IMPRESSION: 1. No evidence of metabolically active multiple myeloma.
2. No lytic lesions on CT portion exam identified.
3. No soft tissue plasmacytoma.

## 2020-08-03 MED ORDER — AMINOLEVULINIC ACID HCL 20 % EX SOLR
1.0000 "application " | Freq: Once | CUTANEOUS | Status: AC
  Administered 2020-08-02: 14:00:00 354 mg via TOPICAL

## 2020-08-03 MED ORDER — FLUDEOXYGLUCOSE F - 18 (FDG) INJECTION
11.1800 | Freq: Once | INTRAVENOUS | Status: AC | PRN
  Administered 2020-08-03: 12:00:00 11.18 via INTRAVENOUS

## 2020-08-03 NOTE — Progress Notes (Signed)
Patient completed PDT therapy today.  1. AK (actinic keratosis) Scalp  Photodynamic therapy - Scalp Procedure discussed: discussed risks, benefits, side effects. and alternatives   Prep: site scrubbed/prepped with acetone   Location:  Scalp Number of lesions:  Multiple Type of treatment:  Blue light Aminolevulinic Acid (see MAR for details): Levulan Number of Levulan sticks used:  1 Incubation time (minutes):  120 Number of minutes under lamp:  16 Number of seconds under lamp:  40 Cooling:  Floor fan Outcome: patient tolerated procedure well with no complications   Post-procedure details: sunscreen applied    Aminolevulinic Acid HCl 20 % SOLR 354 mg - Scalp    

## 2020-08-07 ENCOUNTER — Other Ambulatory Visit: Payer: Self-pay | Admitting: Family Medicine

## 2020-08-17 ENCOUNTER — Inpatient Hospital Stay: Payer: Medicare Other

## 2020-08-17 ENCOUNTER — Other Ambulatory Visit: Payer: Self-pay

## 2020-08-17 DIAGNOSIS — D469 Myelodysplastic syndrome, unspecified: Secondary | ICD-10-CM

## 2020-08-17 DIAGNOSIS — Z79899 Other long term (current) drug therapy: Secondary | ICD-10-CM | POA: Diagnosis not present

## 2020-08-17 DIAGNOSIS — D46Z Other myelodysplastic syndromes: Secondary | ICD-10-CM

## 2020-08-17 LAB — CBC WITH DIFFERENTIAL/PLATELET
Abs Immature Granulocytes: 0.01 10*3/uL (ref 0.00–0.07)
Basophils Absolute: 0 10*3/uL (ref 0.0–0.1)
Basophils Relative: 1 %
Eosinophils Absolute: 0.1 10*3/uL (ref 0.0–0.5)
Eosinophils Relative: 3 %
HCT: 28.7 % — ABNORMAL LOW (ref 39.0–52.0)
Hemoglobin: 9.8 g/dL — ABNORMAL LOW (ref 13.0–17.0)
Immature Granulocytes: 0 %
Lymphocytes Relative: 29 %
Lymphs Abs: 1 10*3/uL (ref 0.7–4.0)
MCH: 37.1 pg — ABNORMAL HIGH (ref 26.0–34.0)
MCHC: 34.1 g/dL (ref 30.0–36.0)
MCV: 108.7 fL — ABNORMAL HIGH (ref 80.0–100.0)
Monocytes Absolute: 0.2 10*3/uL (ref 0.1–1.0)
Monocytes Relative: 6 %
Neutro Abs: 2.2 10*3/uL (ref 1.7–7.7)
Neutrophils Relative %: 61 %
Platelets: 109 10*3/uL — ABNORMAL LOW (ref 150–400)
RBC: 2.64 MIL/uL — ABNORMAL LOW (ref 4.22–5.81)
RDW: 15.1 % (ref 11.5–15.5)
WBC: 3.6 10*3/uL — ABNORMAL LOW (ref 4.0–10.5)
nRBC: 0 % (ref 0.0–0.2)

## 2020-08-17 MED ORDER — LUSPATERCEPT-AAMT 75 MG ~~LOC~~ SOLR
100.0000 mg | SUBCUTANEOUS | Status: DC
  Administered 2020-08-17: 14:00:00 100 mg via SUBCUTANEOUS
  Filled 2020-08-17: qty 0.5

## 2020-08-24 ENCOUNTER — Telehealth: Payer: Self-pay | Admitting: *Deleted

## 2020-08-24 NOTE — Telephone Encounter (Signed)
I called back and spoke to Wanda at [facility]. She states that the pt's son called in and said they need an appt to see dr Anise Salvo and they gave him the appt he already had scheduled but son said that It needs to be sooner than that because he is in clinical trial. Per Dr. Anise Salvo he is not in clinical trial. I told Misty Stanley I am wondering if they are talking about the grant to pay for his reblozyl inj. That are expensive. I told Misty Stanley I would reach out to pt. And see what is going on. I called the pt. And got his voicemail and left a message to please call me. I wanted to help him in regards to the injections he gets.

## 2020-08-24 NOTE — Telephone Encounter (Signed)
Alex Smith with [facility] Dr Anise Salvo office called reporting that patient sees Sr Anise Salvo once a year and the patient get injection from our office every 3 weeks and she states patient said there is some confusion about what the next steps are and she is asking for return call for the plan of care

## 2020-08-24 NOTE — Telephone Encounter (Signed)
Niki- sherry- can you call Misty Stanley?

## 2020-08-25 NOTE — Telephone Encounter (Signed)
I called pt back and I now know why he did not know if he was going to get inj. It is because the appts were wrong. Also he got a letter that the foundation he had been using will end in feb 2022.  I corrected all the appts and I told him the next one will be 1/20 and I have put them in the mail for him. I also emailed Juan Quam to asst for re submit for the foundation he currently has to renew or look for other financial asst for the pt. I did tell pt that he will continue the injections until he has side effects from getting it or it stops helping him. He is agreeable to the above info and will come next on 1/20

## 2020-08-25 NOTE — Telephone Encounter (Signed)
The patient called me back today and he wants to know if injections are going to continue and he needs asst for payment of the injections due to cost. He had a foundation to asst and it will need to be resubmitted to see if he can continue it . I told him that I will send a message to Juan Quam about figuring out what she can do to help  with financial asst. For the drug. I will rtn call when I know more.

## 2020-08-28 ENCOUNTER — Other Ambulatory Visit: Payer: Self-pay | Admitting: Family Medicine

## 2020-09-07 ENCOUNTER — Other Ambulatory Visit: Payer: Self-pay

## 2020-09-07 ENCOUNTER — Inpatient Hospital Stay: Payer: Medicare Other | Attending: Hematology and Oncology

## 2020-09-07 ENCOUNTER — Encounter: Payer: Self-pay | Admitting: Dermatology

## 2020-09-07 ENCOUNTER — Ambulatory Visit: Payer: Medicare Other | Admitting: Dermatology

## 2020-09-07 ENCOUNTER — Inpatient Hospital Stay: Payer: Medicare Other

## 2020-09-07 ENCOUNTER — Ambulatory Visit (INDEPENDENT_AMBULATORY_CARE_PROVIDER_SITE_OTHER): Payer: Medicare Other

## 2020-09-07 DIAGNOSIS — L570 Actinic keratosis: Secondary | ICD-10-CM

## 2020-09-07 DIAGNOSIS — D0472 Carcinoma in situ of skin of left lower limb, including hip: Secondary | ICD-10-CM | POA: Diagnosis not present

## 2020-09-07 DIAGNOSIS — D099 Carcinoma in situ, unspecified: Secondary | ICD-10-CM

## 2020-09-07 DIAGNOSIS — D0471 Carcinoma in situ of skin of right lower limb, including hip: Secondary | ICD-10-CM | POA: Diagnosis not present

## 2020-09-07 DIAGNOSIS — L578 Other skin changes due to chronic exposure to nonionizing radiation: Secondary | ICD-10-CM

## 2020-09-07 DIAGNOSIS — D469 Myelodysplastic syndrome, unspecified: Secondary | ICD-10-CM | POA: Insufficient documentation

## 2020-09-07 DIAGNOSIS — D46Z Other myelodysplastic syndromes: Secondary | ICD-10-CM

## 2020-09-07 LAB — CBC WITH DIFFERENTIAL/PLATELET
Abs Immature Granulocytes: 0.01 10*3/uL (ref 0.00–0.07)
Basophils Absolute: 0 10*3/uL (ref 0.0–0.1)
Basophils Relative: 1 %
Eosinophils Absolute: 0.2 10*3/uL (ref 0.0–0.5)
Eosinophils Relative: 5 %
HCT: 27 % — ABNORMAL LOW (ref 39.0–52.0)
Hemoglobin: 9.2 g/dL — ABNORMAL LOW (ref 13.0–17.0)
Immature Granulocytes: 0 %
Lymphocytes Relative: 29 %
Lymphs Abs: 1 10*3/uL (ref 0.7–4.0)
MCH: 37.4 pg — ABNORMAL HIGH (ref 26.0–34.0)
MCHC: 34.1 g/dL (ref 30.0–36.0)
MCV: 109.8 fL — ABNORMAL HIGH (ref 80.0–100.0)
Monocytes Absolute: 0.3 10*3/uL (ref 0.1–1.0)
Monocytes Relative: 8 %
Neutro Abs: 2 10*3/uL (ref 1.7–7.7)
Neutrophils Relative %: 57 %
Platelets: 100 10*3/uL — ABNORMAL LOW (ref 150–400)
RBC: 2.46 MIL/uL — ABNORMAL LOW (ref 4.22–5.81)
RDW: 14.9 % (ref 11.5–15.5)
WBC: 3.5 10*3/uL — ABNORMAL LOW (ref 4.0–10.5)
nRBC: 0 % (ref 0.0–0.2)

## 2020-09-07 MED ORDER — AMINOLEVULINIC ACID HCL 20 % EX SOLR
1.0000 "application " | Freq: Once | CUTANEOUS | Status: AC
  Administered 2020-09-07: 354 mg via TOPICAL

## 2020-09-07 MED ORDER — LUSPATERCEPT-AAMT 75 MG ~~LOC~~ SOLR
100.0000 mg | SUBCUTANEOUS | Status: DC
  Administered 2020-09-07: 100 mg via SUBCUTANEOUS
  Filled 2020-09-07: qty 1.5

## 2020-09-07 NOTE — Progress Notes (Signed)
1. AK (actinic keratosis) face  Photodynamic therapy - face Procedure discussed: discussed risks, benefits, side effects. and alternatives   Prep: site scrubbed/prepped with acetone   Location:  Face Number of lesions:  Multiple Type of treatment:  Blue light Aminolevulinic Acid (see MAR for details): Levulan Number of Levulan sticks used:  1 Incubation time (minutes):  60 Number of minutes under lamp:  16 Number of seconds under lamp:  40 Cooling:  Floor fan Outcome: patient tolerated procedure well with no complications   Post-procedure details: sunscreen applied and aftercare instructions given to patient    Aminolevulinic Acid HCl 20 % SOLR 354 mg - face   

## 2020-09-07 NOTE — Patient Instructions (Signed)

## 2020-09-07 NOTE — Progress Notes (Signed)
Follow-Up Visit   Subjective  Alex Smith is a 86 y.o. male who presents for the following: SCCIS (R pretibial, L pretibial - patient is here today for [facility] ) and Actinic Keratosis (S/P PDT on the scalp - check for any new or persistent skin lesions.).  The following portions of the chart were reviewed this encounter and updated as appropriate:   Tobacco  Allergies  Meds  Problems  Med Hx  Surg Hx  Fam Hx     Review of Systems:  No other skin or systemic complaints except as noted in HPI or Assessment and Plan.  Objective  Well appearing patient in no apparent distress; mood and affect are within normal limits.  A focused examination was performed including the B/L legs, face, and scalp. Relevant physical exam findings are noted in the Assessment and Plan.  Objective  L lat pretibial: Healing biopsy site   Objective  R med pretibial: Healing biopsy site  Objective  Scalp (8): Erythematous thin papules/macules with gritty scale.   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  Destruction of lesion Complexity: extensive   Destruction method: electrodesiccation and curettage   Informed consent: discussed and consent obtained   Timeout:  patient name, date of birth, surgical site, and procedure verified Procedure prep:  Patient was prepped and draped in usual sterile fashion Prep type:  Isopropyl alcohol Anesthesia: the lesion was anesthetized in a standard fashion   Anesthetic:  1% lidocaine w/ epinephrine 1-100,000 buffered w/ 8.4% NaHCO3 Curettage performed in three different directions: Yes   Electrodesiccation performed over the curetted area: Yes   Lesion length (cm):  1.3 Lesion width (cm):  1.3 Margin per side (cm):  0.2 Final wound size (cm):  1.7 Hemostasis achieved with:  pressure, aluminum chloride and electrodesiccation Outcome: patient tolerated procedure well with no complications   Post-procedure details: sterile  dressing applied and wound care instructions given   Dressing type: bandage and petrolatum   Additional details:  1.7 post tx defect   Epidermal / dermal shaving  Lesion diameter (cm):  1.3 Informed consent: discussed and consent obtained   Timeout: patient name, date of birth, surgical site, and procedure verified   Procedure prep:  Patient was prepped and draped in usual sterile fashion Prep type:  Isopropyl alcohol Anesthesia: the lesion was anesthetized in a standard fashion   Anesthetic:  1% lidocaine w/ epinephrine 1-100,000 buffered w/ 8.4% NaHCO3 Instrument used: flexible razor blade   Hemostasis achieved with: pressure, aluminum chloride and electrodesiccation   Outcome: patient tolerated procedure well   Post-procedure details: sterile dressing applied and wound care instructions given   Dressing type: bandage and petrolatum    R med pretibial  Destruction of lesion Complexity: extensive   Destruction method: electrodesiccation and curettage   Informed consent: discussed and consent obtained   Timeout:  patient name, date of birth, surgical site, and procedure verified Procedure prep:  Patient was prepped and draped in usual sterile fashion Prep type:  Isopropyl alcohol Anesthesia: the lesion was anesthetized in a standard fashion   Anesthetic:  1% lidocaine w/ epinephrine 1-100,000 buffered w/ 8.4% NaHCO3 Curettage performed in three different directions: Yes   Electrodesiccation performed over the curetted area: Yes   Lesion length (cm):  1.8 Lesion width (cm):  1.8 Margin per side (cm):  0.2 Final wound size (cm):  2.2 Hemostasis achieved with:  pressure, aluminum chloride and electrodesiccation Outcome: patient tolerated procedure well with  no complications   Post-procedure details: sterile dressing applied and wound care instructions given   Dressing type: bandage and petrolatum   Additional details:  2.2 cm post tx defect    Epidermal / dermal  shaving  Lesion diameter (cm):  1.8 Informed consent: discussed and consent obtained   Timeout: patient name, date of birth, surgical site, and procedure verified   Procedure prep:  Patient was prepped and draped in usual sterile fashion Prep type:  Isopropyl alcohol Anesthesia: the lesion was anesthetized in a standard fashion   Anesthetic:  1% lidocaine w/ epinephrine 1-100,000 buffered w/ 8.4% NaHCO3 Instrument used: flexible razor blade   Hemostasis achieved with: pressure, aluminum chloride and electrodesiccation   Outcome: patient tolerated procedure well   Post-procedure details: sterile dressing applied and wound care instructions given   Dressing type: bandage and petrolatum    Biopsy proven -  AK (actinic keratosis) (8) Scalp  Destruction of lesion - Scalp Complexity: simple   Destruction method: cryotherapy   Informed consent: discussed and consent obtained   Timeout:  patient name, date of birth, surgical site, and procedure verified Lesion destroyed using liquid nitrogen: Yes   Region frozen until ice ball extended beyond lesion: Yes   Outcome: patient tolerated procedure well with no complications   Post-procedure details: wound care instructions given     Actinic Damage - chronic, secondary to cumulative UV radiation exposure/sun exposure over time - diffuse scaly erythematous macules with underlying dyspigmentation - Recommend daily broad spectrum sunscreen SPF 30+ to sun-exposed areas, reapply every 2 hours as needed.  - Call for new or changing lesions.  Return in about 3 months (around 12/06/2020) for AK follow up .  Luther Redo, CMA, am acting as scribe for Sarina Ser, MD .  Documentation: I have reviewed the above documentation for accuracy and completeness, and I agree with the above.  Sarina Ser, MD

## 2020-09-07 NOTE — Patient Instructions (Signed)
Electrodesiccation and Curettage ("Scrape and Burn") Wound Care Instructions  1. Leave the original bandage on for 24 hours if possible.  If the bandage becomes soaked or soiled before that time, it is OK to remove it and examine the wound.  A small amount of post-operative bleeding is normal.  If excessive bleeding occurs, remove the bandage, place gauze over the site and apply continuous pressure (no peeking) over the area for 30 minutes. If this does not work, please call our clinic as soon as possible or page your doctor if it is after hours.   2. Once a day, cleanse the wound with soap and water. It is fine to shower. If a thick crust develops you may use a Q-tip dipped into dilute hydrogen peroxide (mix 1:1 with water) to dissolve it.  Hydrogen peroxide can slow the healing process, so use it only as needed.    3. After washing, apply petroleum jelly (Vaseline) or an antibiotic ointment if your doctor prescribed one for you, followed by a bandage.    4. For best healing, the wound should be covered with a layer of ointment at all times. If you are not able to keep the area covered with a bandage to hold the ointment in place, this may mean re-applying the ointment several times a day.  Continue this wound care until the wound has healed and is no longer open. It may take several weeks for the wound to heal and close.  Itching and mild discomfort is normal during the healing process.  If you have any discomfort, you can take Tylenol (acetaminophen) or ibuprofen as directed on the bottle. (Please do not take these if you have an allergy to them or cannot take them for another reason).  Some redness, tenderness and white or yellow material in the wound is normal healing.  If the area becomes very sore and red, or develops a thick yellow-green material (pus), it may be infected; please notify us.    Wound healing continues for up to one year following surgery. It is not unusual to experience pain  in the scar from time to time during the interval.  If the pain becomes severe or the scar thickens, you should notify the office.    A slight amount of redness in a scar is expected for the first six months.  After six months, the redness will fade and the scar will soften and fade.  The color difference becomes less noticeable with time.  If there are any problems, return for a post-op surgery check at your earliest convenience.  To improve the appearance of the scar, you can use silicone scar gel, cream, or sheets (such as Mederma or Serica) every night for up to one year. These are available over the counter (without a prescription).  Please call our office at [phone] for any questions or concerns.  

## 2020-09-08 ENCOUNTER — Other Ambulatory Visit: Payer: Self-pay | Admitting: Family Medicine

## 2020-09-08 ENCOUNTER — Ambulatory Visit: Payer: Medicare Other

## 2020-09-11 ENCOUNTER — Encounter: Payer: Self-pay | Admitting: Dermatology

## 2020-09-28 ENCOUNTER — Inpatient Hospital Stay: Payer: Medicare (Managed Care)

## 2020-09-28 ENCOUNTER — Inpatient Hospital Stay: Payer: Medicare (Managed Care) | Attending: Hematology and Oncology

## 2020-09-28 DIAGNOSIS — Z5112 Encounter for antineoplastic immunotherapy: Secondary | ICD-10-CM | POA: Diagnosis present

## 2020-09-28 DIAGNOSIS — D46Z Other myelodysplastic syndromes: Secondary | ICD-10-CM

## 2020-09-28 DIAGNOSIS — C9000 Multiple myeloma not having achieved remission: Secondary | ICD-10-CM | POA: Diagnosis present

## 2020-09-28 DIAGNOSIS — D469 Myelodysplastic syndrome, unspecified: Secondary | ICD-10-CM

## 2020-09-28 LAB — CBC WITH DIFFERENTIAL/PLATELET
Abs Immature Granulocytes: 0.02 10*3/uL (ref 0.00–0.07)
Basophils Absolute: 0 10*3/uL (ref 0.0–0.1)
Basophils Relative: 0 %
Eosinophils Absolute: 0.1 10*3/uL (ref 0.0–0.5)
Eosinophils Relative: 2 %
HCT: 29.5 % — ABNORMAL LOW (ref 39.0–52.0)
Hemoglobin: 10 g/dL — ABNORMAL LOW (ref 13.0–17.0)
Immature Granulocytes: 0 %
Lymphocytes Relative: 30 %
Lymphs Abs: 1.5 10*3/uL (ref 0.7–4.0)
MCH: 36.8 pg — ABNORMAL HIGH (ref 26.0–34.0)
MCHC: 33.9 g/dL (ref 30.0–36.0)
MCV: 108.5 fL — ABNORMAL HIGH (ref 80.0–100.0)
Monocytes Absolute: 0.3 10*3/uL (ref 0.1–1.0)
Monocytes Relative: 6 %
Neutro Abs: 3.2 10*3/uL (ref 1.7–7.7)
Neutrophils Relative %: 62 %
Platelets: 124 10*3/uL — ABNORMAL LOW (ref 150–400)
RBC: 2.72 MIL/uL — ABNORMAL LOW (ref 4.22–5.81)
RDW: 15.2 % (ref 11.5–15.5)
WBC: 5.1 10*3/uL (ref 4.0–10.5)
nRBC: 0.4 % — ABNORMAL HIGH (ref 0.0–0.2)

## 2020-09-28 MED ORDER — LUSPATERCEPT-AAMT 75 MG ~~LOC~~ SOLR
100.0000 mg | SUBCUTANEOUS | Status: DC
  Administered 2020-09-28: 100 mg via SUBCUTANEOUS
  Filled 2020-09-28: qty 0.5

## 2020-10-26 ENCOUNTER — Inpatient Hospital Stay: Payer: Medicare (Managed Care)

## 2020-10-26 ENCOUNTER — Inpatient Hospital Stay: Payer: Medicare (Managed Care) | Attending: Hematology and Oncology

## 2020-10-26 DIAGNOSIS — D469 Myelodysplastic syndrome, unspecified: Secondary | ICD-10-CM | POA: Insufficient documentation

## 2020-10-27 ENCOUNTER — Inpatient Hospital Stay: Payer: Medicare (Managed Care)

## 2020-10-27 ENCOUNTER — Ambulatory Visit: Payer: Medicare Other | Admitting: Oncology

## 2020-10-27 ENCOUNTER — Ambulatory Visit: Payer: Medicare Other

## 2020-10-27 ENCOUNTER — Inpatient Hospital Stay (HOSPITAL_BASED_OUTPATIENT_CLINIC_OR_DEPARTMENT_OTHER): Payer: Medicare (Managed Care) | Admitting: Oncology

## 2020-10-27 ENCOUNTER — Other Ambulatory Visit: Payer: Self-pay

## 2020-10-27 ENCOUNTER — Other Ambulatory Visit: Payer: Medicare Other

## 2020-10-27 ENCOUNTER — Encounter: Payer: Self-pay | Admitting: Oncology

## 2020-10-27 VITALS — BP 136/76 | HR 67 | Temp 98.8°F | Resp 16 | Wt 221.0 lb

## 2020-10-27 DIAGNOSIS — D469 Myelodysplastic syndrome, unspecified: Secondary | ICD-10-CM | POA: Diagnosis not present

## 2020-10-27 DIAGNOSIS — D472 Monoclonal gammopathy: Secondary | ICD-10-CM

## 2020-10-27 DIAGNOSIS — C9000 Multiple myeloma not having achieved remission: Secondary | ICD-10-CM

## 2020-10-27 DIAGNOSIS — Z79899 Other long term (current) drug therapy: Secondary | ICD-10-CM

## 2020-10-27 DIAGNOSIS — D46Z Other myelodysplastic syndromes: Secondary | ICD-10-CM

## 2020-10-27 LAB — CBC WITH DIFFERENTIAL/PLATELET
Abs Immature Granulocytes: 0.01 10*3/uL (ref 0.00–0.07)
Basophils Absolute: 0 10*3/uL (ref 0.0–0.1)
Basophils Relative: 1 %
Eosinophils Absolute: 0.1 10*3/uL (ref 0.0–0.5)
Eosinophils Relative: 3 %
HCT: 29.1 % — ABNORMAL LOW (ref 39.0–52.0)
Hemoglobin: 10 g/dL — ABNORMAL LOW (ref 13.0–17.0)
Immature Granulocytes: 0 %
Lymphocytes Relative: 27 %
Lymphs Abs: 1.1 10*3/uL (ref 0.7–4.0)
MCH: 37.6 pg — ABNORMAL HIGH (ref 26.0–34.0)
MCHC: 34.4 g/dL (ref 30.0–36.0)
MCV: 109.4 fL — ABNORMAL HIGH (ref 80.0–100.0)
Monocytes Absolute: 0.3 10*3/uL (ref 0.1–1.0)
Monocytes Relative: 7 %
Neutro Abs: 2.5 10*3/uL (ref 1.7–7.7)
Neutrophils Relative %: 62 %
Platelets: 124 10*3/uL — ABNORMAL LOW (ref 150–400)
RBC: 2.66 MIL/uL — ABNORMAL LOW (ref 4.22–5.81)
RDW: 14.8 % (ref 11.5–15.5)
WBC: 4 10*3/uL (ref 4.0–10.5)
nRBC: 0 % (ref 0.0–0.2)

## 2020-10-27 LAB — COMPREHENSIVE METABOLIC PANEL WITH GFR
ALT: 16 U/L (ref 0–44)
AST: 25 U/L (ref 15–41)
Albumin: 3.7 g/dL (ref 3.5–5.0)
Alkaline Phosphatase: 67 U/L (ref 38–126)
Anion gap: 8 (ref 5–15)
BUN: 36 mg/dL — ABNORMAL HIGH (ref 8–23)
CO2: 21 mmol/L — ABNORMAL LOW (ref 22–32)
Calcium: 8.8 mg/dL — ABNORMAL LOW (ref 8.9–10.3)
Chloride: 106 mmol/L (ref 98–111)
Creatinine, Ser: 1.85 mg/dL — ABNORMAL HIGH (ref 0.61–1.24)
GFR, Estimated: 36 mL/min — ABNORMAL LOW
Glucose, Bld: 347 mg/dL — ABNORMAL HIGH (ref 70–99)
Potassium: 4.4 mmol/L (ref 3.5–5.1)
Sodium: 135 mmol/L (ref 135–145)
Total Bilirubin: 0.9 mg/dL (ref 0.3–1.2)
Total Protein: 7.7 g/dL (ref 6.5–8.1)

## 2020-10-27 MED ORDER — LUSPATERCEPT-AAMT 75 MG ~~LOC~~ SOLR
100.0000 mg | SUBCUTANEOUS | Status: DC
  Administered 2020-10-27: 100 mg via SUBCUTANEOUS
  Filled 2020-10-27: qty 0.5

## 2020-10-27 NOTE — Progress Notes (Signed)
Not got appetite, eats about 1/2 of what he use to eat drink. BM good. Neuropathy of feet. Sleeps good with meds to help.

## 2020-10-29 NOTE — Progress Notes (Signed)
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 [name] and is transitioning his care to me. He has following issues:  1.Hairy cell leukemia that was initially diagnosed on bone marrow biopsy in June 2016. Bone marrow biopsy at that time showed extensive marrow involvement with recurrent hairy cell leukemia approximately 80% of the cells in the core 10% monoclonal plasma cell infiltrate compatible with plasma cell neoplasm. Peripheral smear revealed leukopenia with a white count of 1900 compatible with hairy cell leukemia.FISH studies revealed an abnormal myeloma panel with CCND1/IGH translocation t (11;14) and loss of MAF/16q. FISH studies for MDS were negative. Cytogenetics were normal (46, XY).He has last received cladribine for this back in July 2016  2.Smoldering multiple myeloma: Bone marrow biopsy in March 2017 revealed persistent plasma cell neoplasm with monoclonal plasma cells estimated at 20 to 30%. No morphologic evidence of residual hairy cell leukemia.Marrow was normocellular for age with relative erythroid hyperplasia, relative myeloid hypoplasia, mild dyspoiesis, adequate megakaryocytes and no increased blasts. There was no significant  increase in marrow reticulin fibers. Iron was present. FISH studies for MDS were negative. FISH panel for myeloma revealed CCND1/IGH translocation- t(11;14) and loss of MAF/16q. Cytogenetics were normal (46, XY).Last bone marrow biopsy was in October 2018 which showed variably cellular marrow 10 to 50% with kappa restricted plasmacytosis of 10 to 15%. No evidence of leukemia lymphoma or high-grade dysplasia. Normal cytogenetics. He has also been followed by Dr. [name] at [facility]. He has received Revlimid and Decadron in the past in 2017 which was subsequently stopped. PET scan in July 2017 showed no hypermetabolic adenopathy or focal skeletal lesions.  3.Probable MDS: He has received a trial of Procrit in the past in 2017 but apparently did not respond to it. He has been getting periodic blood transfusions to maintain his hemoglobin closer to 9 as he is symptomatic when his hemoglobin is less than 9. He has been getting about 2-3 blood transfusions during the year. He does have some mild leukopenia/neutropenia and is on acyclovir prophylaxis. He gets weekly G-CSF when his ANC is less than 1.5.  4.Also has history of B12 deficiency for which he is on oral B12.   Interval history-patient has baseline fatigue and occasional exertional shortness of breath.  Denies any other complaints at this time  ECOG PS- 1 Pain scale- 0   Review of systems- Review of Systems  Constitutional: Positive for malaise/fatigue. Negative for chills, fever and weight loss.  HENT: Negative for congestion, ear discharge and nosebleeds.   Eyes: Negative for blurred vision.  Respiratory: Negative for cough, hemoptysis, sputum production, shortness of breath and wheezing.   Cardiovascular: Negative for chest pain, palpitations, orthopnea and claudication.  Gastrointestinal: Negative for abdominal pain, blood in stool, constipation, diarrhea, heartburn, melena, nausea and vomiting.  Genitourinary:  Negative for dysuria, flank pain, frequency, hematuria and urgency.  Musculoskeletal: Negative  for back pain, joint pain and myalgias.  Skin: Negative for rash.  Neurological: Negative for dizziness, tingling, focal weakness, seizures, weakness and headaches.  Endo/Heme/Allergies: Does not bruise/bleed easily.  Psychiatric/Behavioral: Negative for depression and suicidal ideas. The patient does not have insomnia.        Allergies  Allergen Reactions  . Rituximab Rash    Chest tightness Chest tightness  . Blood-Group Specific Substance Other (See Comments)    Had a post transfusion reaction of red blood cells; NOW REQUIRES WASHED BLOOD CELLS Had a post transfusion reaction of red blood cells; NOW REQUIRES WASHED BLOOD CELLS  . Primaxin [Imipenem] Other (See Comments)    Possible allergy Possible allergy  . Voriconazole Other (See Comments)  . Sulfa Antibiotics Itching and Rash  . Sulfacetamide Sodium Itching and Rash     Past Medical History:  Diagnosis Date  . Anemia   . B12 deficiency   . Basal cell carcinoma 03/30/2018   left ant nasal ala  . Basal cell carcinoma of face 01/19/2013   right distal dorsum nose  . BPH (benign prostatic hypertrophy)    followed by urology, discharged (Dr. Bernardo Heater)  . CAP (community acquired pneumonia) 02/15/2015  . CKD stage 3 due to type 2 diabetes mellitus ([place]) 11/14/2017  . Colon polyps   . Diverticulosis   . Dysplastic nevus 10/27/2006   right med calf  . GERD (gastroesophageal reflux disease)   . Hairy cell leukemia ([place]) 2006   recurrent, seizure on rituxan, now on cladribine ([place])  . History of pneumonia 2000's   "once" (07/07/2012)  . History of shingles   . HLD (hyperlipidemia)   . Hypertension   . Pneumonia   . Shortness of breath dyspnea   . Squamous cell carcinoma in situ 07/05/2020   Left lat. pretibial. EDC 09/07/2020  . Squamous cell carcinoma in situ 07/05/2020   Right medial pretibial. EDC 09/07/2020  .  Squamous cell carcinoma of skin 04/03/2020   Left cheek. WD SCC  . Systolic murmur 08/07/7587  . Type 2 diabetes, controlled, with retinopathy ([place])      Past Surgical History:  Procedure Laterality Date  . [place] VITRECTOMY WITH 20 GAUGE MVR PORT FOR MACULAR HOLE  07/07/2012   Procedure: 25 GAUGE PARS PLANA VITRECTOMY WITH 20 GAUGE MVR PORT FOR MACULAR HOLE;  Surgeon: Hayden Pedro, MD;  Location: [place];  Service: Ophthalmology;  Laterality: Left;  . BONE MARROW BIOPSY  2016  . CARDIOVASCULAR STRESS TEST  2013   treadmill - no evidence ischemia, EF 61%  . CATARACT EXTRACTION W/ INTRAOCULAR LENS  IMPLANT, BILATERAL  ~ 2010  . COLONOSCOPY  2014   [place] WNL no rpt needed, h/o polyps  . EYE SURGERY Left 06/2012   laser surgery  . GAS INSERTION  07/07/2012   Procedure: INSERTION OF GAS;  Surgeon: Hayden Pedro, MD;  Location: [place];  Service: Ophthalmology;  Laterality: Left;  C3F8  . PERIPHERAL VASCULAR CATHETERIZATION N/A 02/23/2015   Procedure: Glori Luis Cath Insertion;  Surgeon: Algernon Huxley, MD;  Location: [place] [facility];  Service: Cardiovascular;  Laterality: N/A;  . PORTA CATH REMOVAL N/A 02/16/2020   Procedure: PORTA CATH REMOVAL;  Surgeon: Algernon Huxley, MD;  Location: [place] [facility];  Service: Cardiovascular;  Laterality: N/A;  . SERUM PATCH  07/07/2012   Procedure: SERUM PATCH;  Surgeon: Hayden Pedro, MD;  Location: [place];  Service: Ophthalmology;  Laterality: Left;  . SKIN  CANCER EXCISION     "all over my face" (07/07/2012)    Social History   Socioeconomic History  . Marital status: Married    Spouse name: Not on file  . Number of children: Not on file  . Years of education: Not on file  . Highest education level: Not on file  Occupational History  . Not on file  Tobacco Use  . Smoking status: Former Smoker    Packs/day: 2.00    Years: 25.00    Pack years: 50.00    Types: Cigarettes    Quit date: 02/06/1969    Years since quitting: 51.7   . Smokeless tobacco: Never Used  . Tobacco comment: 07/07/2012 "stopped smoking ~ 40 yr ago; smoked 20-81yr  Vaping Use  . Vaping Use: Never used  Substance and Sexual Activity  . Alcohol use: Yes    Alcohol/week: 0.0 standard drinks    Comment: rare  . Drug use: No  . Sexual activity: Never  Other Topics Concern  . Not on file  Social History Narrative   Lives at home alone. 1 cat   Occupation: was cArt gallery manager works part time for [employer]   Activity: likes to golf   Diet: moderate water, fruits/vegetables daily   Social Determinants of HRadio broadcast assistantStrain: Not on file  Food Insecurity: Not on file  Transportation Needs: Not on file  Physical Activity: Not on file  Stress: Not on file  Social Connections: Not on file  Intimate Partner Violence: Not on file    Family History  Problem Relation Age of Onset  . Dementia Mother   . Heart failure Father 74 . Cancer Sister        breast  . Diabetes Paternal Uncle   . Diabetes Paternal Aunt   . CAD Brother 417      MI  . Stroke Neg Hx      Current Outpatient Medications:  .  aspirin EC 81 MG tablet, Take 81 mg by mouth daily., Disp: , Rfl:  .  DIPHENHYDRAMINE-ACETAMINOPHEN PO, Take 1 tablet by mouth at bedtime., Disp: , Rfl:  .  doxazosin (CARDURA) 1 MG tablet, TAKE ONE TABLET BY MOUTH EVERY DAY, Disp: 30 tablet, Rfl: 5 .  famotidine (PEPCID) 40 MG tablet, Take 0.5 tablets (20 mg total) by mouth daily. NEEDS OFFICE VISIT, Disp: 15 tablet, Rfl: 0 .  glucose blood (ACCU-CHEK AVIVA PLUS) test strip, USE TO CHECK SUGAR DAILY E11.319, Disp: 200 each, Rfl: 3 .  HUMULIN N 100 UNIT/ML injection, INJECT 39 UNITS SUBCUTANEOUSLY TWICE DAILY BEFORE A MEAL (Patient taking differently: Inject 1 Units into the skin as directed. Pt uses this as sliding scale), Disp: 20 mL, Rfl: 2 .  HUMULIN R 100 UNIT/ML injection, INJECT 10 UNITS INTO THE SKIN TWICE DAILY WITH MEALS, Disp: 10 mL, Rfl: 5 .  hydrochlorothiazide  (HYDRODIURIL) 25 MG tablet, TAKE 1 TABLET BY MOUTH DAILY, Disp: 90 tablet, Rfl: 1 .  lisinopril (ZESTRIL) 20 MG tablet, TAKE ONE TABLET BY MOUTH TWICE DAILY, Disp: 180 tablet, Rfl: 0 .  Microlet Lancets MISC, USE AS DIRECTED, Disp: 200 each, Rfl: 3 .  omeprazole (PRILOSEC) 20 MG capsule, Take 1 capsule by mouth 1 day or 1 dose., Disp: , Rfl:  .  pravastatin (PRAVACHOL) 20 MG tablet, TAKE ONE TABLET EVERY EVENING, Disp: 30 tablet, Rfl: 0 .  temazepam (RESTORIL) 7.5 MG capsule, Take 1 capsule by mouth daily., Disp: , Rfl:  .  traZODone (DESYREL) 50 MG tablet, TAKE 1/2 TO 1 TABLET BY MOUTH AT BEDTIMEAS NEEDED FOR SLEEP, Disp: 30 tablet, Rfl: 0 .  ULTICARE INSULIN SYRINGE 31G X 5/16" 0.5 ML MISC, See admin instructions., Disp: , Rfl:  .  vitamin B-12 (CYANOCOBALAMIN) 1000 MCG tablet, Take 1 tablet (1,000 mcg total) by mouth daily., Disp: 90 tablet, Rfl: 3 .  vitamin E 400 UNIT capsule, Take 200 Units by mouth daily. , Disp: , Rfl:  .  tamsulosin (FLOMAX) 0.4 MG CAPS capsule, TAKE 1 CAPSULE BY MOUTH EVERY DAY (Patient not taking: Reported on 10/27/2020), Disp: 30 capsule, Rfl: 3  Current Facility-Administered Medications:  .  Tbo-Filgrastim (GRANIX) injection 480 mcg, 480 mcg, Subcutaneous, Once, [place], Melissa C, MD  Facility-Administered Medications Ordered in Other Visits:  .  heparin lock flush 100 unit/mL, 500 Units, Intravenous, Once, [place], Melissa C, MD .  luspatercept-aamt (REBLOZYL) subcutaneous injection 100 mg, 100 mg, Subcutaneous, Q30 days, Sindy Guadeloupe, MD .  luspatercept-aamt (REBLOZYL) subcutaneous injection 100 mg, 100 mg, Subcutaneous, Q30 days, Sindy Guadeloupe, MD, 100 mg at 11/18/19 1252 .  luspatercept-aamt (REBLOZYL) subcutaneous injection 100 mg, 100 mg, Subcutaneous, Q21 days, Sindy Guadeloupe, MD, 100 mg at 02/10/20 1435 .  luspatercept-aamt (REBLOZYL) subcutaneous injection 100 mg, 100 mg, Subcutaneous, Q21 days, [place], Melissa C, MD, 100 mg at 03/02/20 1452 .   luspatercept-aamt (REBLOZYL) subcutaneous injection 100 mg, 100 mg, Subcutaneous, Q21 days, Cor[place], Melissa C, MD, 100 mg at 05/05/20 1447 .  luspatercept-aamt (REBLOZYL) subcutaneous injection 100 mg, 100 mg, Subcutaneous, Q21 days, [place], Melissa C, MD, 100 mg at 07/28/20 1447 .  sodium chloride 0.9 % injection 10 mL, 10 mL, Intravenous, PRN, [name], Melissa C, MD, 10 mL at 03/03/15 0903 .  sodium chloride 0.9 % injection 10 mL, 10 mL, Intracatheter, PRN, [place], Melissa C, MD, 10 mL at 03/10/15 1410 .  sodium chloride flush (NS) 0.9 % injection 10 mL, 10 mL, Intravenous, PRN, [name], Melissa C, MD, 10 mL at 07/23/18 1517  Physical exam:  Physical Exam Constitutional:      General: He is not in acute distress. Cardiovascular:     Rate and Rhythm: Normal rate and regular rhythm.     Heart sounds: Normal heart sounds.  Pulmonary:     Effort: Pulmonary effort is normal.     Breath sounds: Normal breath sounds.  Skin:    General: Skin is warm and dry.  Neurological:     Mental Status: He is alert and oriented to person, place, and time.      CMP Latest Ref Rng & Units 10/27/2020  Glucose 70 - 99 mg/dL 347(H)  BUN 8 - 23 mg/dL 36(H)  Creatinine 0.61 - 1.24 mg/dL 1.85(H)  Sodium 135 - 145 mmol/L 135  Potassium 3.5 - 5.1 mmol/L 4.4  Chloride 98 - 111 mmol/L 106  CO2 22 - 32 mmol/L 21(L)  Calcium 8.9 - 10.3 mg/dL 8.8(L)  Total Protein 6.5 - 8.1 g/dL 7.7  Total Bilirubin 0.3 - 1.2 mg/dL 0.9  Alkaline Phos 38 - 126 U/L 67  AST 15 - 41 U/L 25  ALT 0 - 44 U/L 16   CBC Latest Ref Rng & Units 10/27/2020  WBC 4.0 - 10.5 K/uL 4.0  Hemoglobin 13.0 - 17.0 g/dL 10.0(L)  Hematocrit 39.0 - 52.0 % 29.1(L)  Platelets 150 - 400 K/uL 124(L)     Assessment and plan- Patient is a 86 y.o. male who is here for follow-up of following issues:  1.  MDS: Currently on Luspatercept and tolerating it well.  After starting Luspatercept his hemoglobin has improved And his present hemoglobin is  somewhere stable between 10-11.  He will receive his next dose of Luspatercept today and continue to receive it every 3 weeks.  I will see him back in 3 months  2.  Smoldering multiple myeloma: Recent PET scan was negative for any active myeloma.Myeloma panel and serum free light chains from today is pending.  We have been checking it every 3 months.  Presently M protein is stable somewhere between 1.2-1.5 serum free light chains stable between 5-6.  Continue to monitor   Visit Diagnosis 1. Smoldering multiple myeloma ([place])   2. High risk medication use   3. MDS (myelodysplastic syndrome) ([place])      Dr. Randa Evens, MD, MPH [facility] [phone] 10/29/2020 2:43 PM

## 2020-10-30 LAB — KAPPA/LAMBDA LIGHT CHAINS
Kappa free light chain: 73.8 mg/L — ABNORMAL HIGH (ref 3.3–19.4)
Kappa, lambda light chain ratio: 5.13 — ABNORMAL HIGH (ref 0.26–1.65)
Lambda free light chains: 14.4 mg/L (ref 5.7–26.3)

## 2020-10-30 NOTE — Progress Notes (Signed)
PSN has attempted to contact patient to obtain information needed to renew his grant to pay toward is oncology medication.  Patient has not returned PSN phone calls.

## 2020-10-31 LAB — MULTIPLE MYELOMA PANEL, SERUM
Albumin SerPl Elph-Mcnc: 3.5 g/dL (ref 2.9–4.4)
Albumin/Glob SerPl: 1 (ref 0.7–1.7)
Alpha 1: 0.2 g/dL (ref 0.0–0.4)
Alpha2 Glob SerPl Elph-Mcnc: 0.9 g/dL (ref 0.4–1.0)
B-Globulin SerPl Elph-Mcnc: 0.9 g/dL (ref 0.7–1.3)
Gamma Glob SerPl Elph-Mcnc: 1.7 g/dL (ref 0.4–1.8)
Globulin, Total: 3.7 g/dL (ref 2.2–3.9)
IgA: 20 mg/dL — ABNORMAL LOW (ref 61–437)
IgG (Immunoglobin G), Serum: 2113 mg/dL — ABNORMAL HIGH (ref 603–1613)
IgM (Immunoglobulin M), Srm: 27 mg/dL (ref 15–143)
M Protein SerPl Elph-Mcnc: 1.6 g/dL — ABNORMAL HIGH
Total Protein ELP: 7.2 g/dL (ref 6.0–8.5)

## 2020-11-10 ENCOUNTER — Other Ambulatory Visit: Payer: Self-pay | Admitting: Family Medicine

## 2020-11-13 ENCOUNTER — Ambulatory Visit: Payer: Medicare Other | Admitting: Dermatology

## 2020-11-16 ENCOUNTER — Inpatient Hospital Stay: Payer: Medicare (Managed Care)

## 2020-11-16 ENCOUNTER — Other Ambulatory Visit: Payer: Self-pay

## 2020-11-16 ENCOUNTER — Other Ambulatory Visit: Payer: Medicare Other

## 2020-11-16 ENCOUNTER — Ambulatory Visit: Payer: Medicare Other

## 2020-11-16 ENCOUNTER — Other Ambulatory Visit: Payer: Self-pay | Admitting: *Deleted

## 2020-11-16 ENCOUNTER — Ambulatory Visit: Payer: Medicare Other | Admitting: Oncology

## 2020-11-16 VITALS — BP 163/73 | HR 89

## 2020-11-16 DIAGNOSIS — D46Z Other myelodysplastic syndromes: Secondary | ICD-10-CM

## 2020-11-16 DIAGNOSIS — D469 Myelodysplastic syndrome, unspecified: Secondary | ICD-10-CM | POA: Diagnosis not present

## 2020-11-16 LAB — CBC WITH DIFFERENTIAL/PLATELET
Abs Immature Granulocytes: 0.02 10*3/uL (ref 0.00–0.07)
Basophils Absolute: 0 10*3/uL (ref 0.0–0.1)
Basophils Relative: 1 %
Eosinophils Absolute: 0.1 10*3/uL (ref 0.0–0.5)
Eosinophils Relative: 2 %
HCT: 29 % — ABNORMAL LOW (ref 39.0–52.0)
Hemoglobin: 9.7 g/dL — ABNORMAL LOW (ref 13.0–17.0)
Immature Granulocytes: 1 %
Lymphocytes Relative: 26 %
Lymphs Abs: 1.1 10*3/uL (ref 0.7–4.0)
MCH: 37.3 pg — ABNORMAL HIGH (ref 26.0–34.0)
MCHC: 33.4 g/dL (ref 30.0–36.0)
MCV: 111.5 fL — ABNORMAL HIGH (ref 80.0–100.0)
Monocytes Absolute: 0.3 10*3/uL (ref 0.1–1.0)
Monocytes Relative: 6 %
Neutro Abs: 2.7 10*3/uL (ref 1.7–7.7)
Neutrophils Relative %: 64 %
Platelets: 110 10*3/uL — ABNORMAL LOW (ref 150–400)
RBC: 2.6 MIL/uL — ABNORMAL LOW (ref 4.22–5.81)
RDW: 14.9 % (ref 11.5–15.5)
WBC: 4.2 10*3/uL (ref 4.0–10.5)
nRBC: 0 % (ref 0.0–0.2)

## 2020-11-16 MED ORDER — LUSPATERCEPT-AAMT 75 MG ~~LOC~~ SOLR: 100.0000 mg | SUBCUTANEOUS | Status: DC

## 2020-11-17 ENCOUNTER — Inpatient Hospital Stay: Payer: Medicare (Managed Care) | Attending: Hematology and Oncology

## 2020-11-17 ENCOUNTER — Inpatient Hospital Stay: Payer: Medicare (Managed Care)

## 2020-11-17 VITALS — BP 173/73 | HR 93

## 2020-11-17 DIAGNOSIS — D469 Myelodysplastic syndrome, unspecified: Secondary | ICD-10-CM | POA: Diagnosis not present

## 2020-11-17 DIAGNOSIS — D46Z Other myelodysplastic syndromes: Secondary | ICD-10-CM

## 2020-11-17 MED ORDER — LUSPATERCEPT-AAMT 75 MG ~~LOC~~ SOLR
100.0000 mg | SUBCUTANEOUS | Status: DC
  Administered 2020-11-17: 100 mg via SUBCUTANEOUS
  Filled 2020-11-17: qty 1.5

## 2020-11-20 NOTE — Progress Notes (Signed)
Patient received a 10,000 grant from the [place] for the dates of Feb. 2021-Feb 2022.

## 2020-12-04 ENCOUNTER — Ambulatory Visit: Admit: 2020-12-04 | Discharge: 2020-12-05 | Payer: MEDICARE | Attending: Internal Medicine | Primary: Internal Medicine

## 2020-12-04 DIAGNOSIS — Z79899 Other long term (current) drug therapy: Principal | ICD-10-CM

## 2020-12-04 DIAGNOSIS — C9000 Multiple myeloma not having achieved remission: Principal | ICD-10-CM

## 2020-12-04 DIAGNOSIS — D469 Myelodysplastic syndrome, unspecified: Principal | ICD-10-CM

## 2020-12-08 ENCOUNTER — Inpatient Hospital Stay: Payer: Medicare (Managed Care)

## 2020-12-08 ENCOUNTER — Other Ambulatory Visit: Payer: Self-pay | Admitting: *Deleted

## 2020-12-08 DIAGNOSIS — D469 Myelodysplastic syndrome, unspecified: Secondary | ICD-10-CM | POA: Diagnosis not present

## 2020-12-08 DIAGNOSIS — D46Z Other myelodysplastic syndromes: Secondary | ICD-10-CM

## 2020-12-08 DIAGNOSIS — D4620 Refractory anemia with excess of blasts, unspecified: Secondary | ICD-10-CM

## 2020-12-08 LAB — CBC
HCT: 28.3 % — ABNORMAL LOW (ref 39.0–52.0)
Hemoglobin: 9.5 g/dL — ABNORMAL LOW (ref 13.0–17.0)
MCH: 37.3 pg — ABNORMAL HIGH (ref 26.0–34.0)
MCHC: 33.6 g/dL (ref 30.0–36.0)
MCV: 111 fL — ABNORMAL HIGH (ref 80.0–100.0)
Platelets: 120 10*3/uL — ABNORMAL LOW (ref 150–400)
RBC: 2.55 MIL/uL — ABNORMAL LOW (ref 4.22–5.81)
RDW: 14.9 % (ref 11.5–15.5)
WBC: 4 10*3/uL (ref 4.0–10.5)
nRBC: 0 % (ref 0.0–0.2)

## 2020-12-08 MED ORDER — LUSPATERCEPT-AAMT 75 MG ~~LOC~~ SOLR
100.0000 mg | SUBCUTANEOUS | Status: DC
  Administered 2020-12-08: 100 mg via SUBCUTANEOUS
  Filled 2020-12-08: qty 0.5

## 2020-12-21 ENCOUNTER — Ambulatory Visit: Payer: Medicare (Managed Care) | Admitting: Podiatry

## 2020-12-25 ENCOUNTER — Other Ambulatory Visit: Payer: Self-pay

## 2020-12-25 ENCOUNTER — Ambulatory Visit (INDEPENDENT_AMBULATORY_CARE_PROVIDER_SITE_OTHER): Payer: Medicare (Managed Care) | Admitting: Dermatology

## 2020-12-25 DIAGNOSIS — L578 Other skin changes due to chronic exposure to nonionizing radiation: Secondary | ICD-10-CM | POA: Diagnosis not present

## 2020-12-25 DIAGNOSIS — L905 Scar conditions and fibrosis of skin: Secondary | ICD-10-CM | POA: Diagnosis not present

## 2020-12-25 DIAGNOSIS — Z85828 Personal history of other malignant neoplasm of skin: Secondary | ICD-10-CM

## 2020-12-25 DIAGNOSIS — L570 Actinic keratosis: Secondary | ICD-10-CM | POA: Diagnosis not present

## 2020-12-25 DIAGNOSIS — L814 Other melanin hyperpigmentation: Secondary | ICD-10-CM | POA: Diagnosis not present

## 2020-12-25 DIAGNOSIS — E1122 Type 2 diabetes mellitus with diabetic chronic kidney disease: Secondary | ICD-10-CM

## 2020-12-25 DIAGNOSIS — L821 Other seborrheic keratosis: Secondary | ICD-10-CM | POA: Diagnosis not present

## 2020-12-25 DIAGNOSIS — N183 Chronic kidney disease, stage 3 unspecified: Secondary | ICD-10-CM

## 2020-12-25 DIAGNOSIS — D485 Neoplasm of uncertain behavior of skin: Secondary | ICD-10-CM

## 2020-12-25 DIAGNOSIS — Z86007 Personal history of in-situ neoplasm of skin: Secondary | ICD-10-CM

## 2020-12-25 DIAGNOSIS — L820 Inflamed seborrheic keratosis: Secondary | ICD-10-CM | POA: Diagnosis not present

## 2020-12-25 DIAGNOSIS — L280 Lichen simplex chronicus: Secondary | ICD-10-CM

## 2020-12-25 DIAGNOSIS — D1800 Hemangioma unspecified site: Secondary | ICD-10-CM

## 2020-12-25 DIAGNOSIS — Z86018 Personal history of other benign neoplasm: Secondary | ICD-10-CM

## 2020-12-25 NOTE — Patient Instructions (Addendum)
Cryotherapy Aftercare  . Wash gently with soap and water everyday.   Marland Kitchen Apply Vaseline and Band-Aid daily until healed.   Wound Care Instructions  1. Cleanse wound gently with soap and water once a day then pat dry with clean gauze. Apply a thing coat of Petrolatum (petroleum jelly, "Vaseline") over the wound (unless you have an allergy to this). We recommend that you use a new, sterile tube of Vaseline. Do not pick or remove scabs. Do not remove the yellow or white "healing tissue" from the base of the wound.  2. Cover the wound with fresh, clean, nonstick gauze and secure with paper tape. You may use Band-Aids in place of gauze and tape if the would is small enough, but would recommend trimming much of the tape off as there is often too much. Sometimes Band-Aids can irritate the skin.  3. You should call the office for your biopsy report after 1 week if you have not already been contacted.  4. If you experience any problems, such as abnormal amounts of bleeding, swelling, significant bruising, significant pain, or evidence of infection, please call the office immediately.   If you have any questions or concerns for your doctor, please call our main line at [phone] and press option 4 to reach your doctor's medical assistant. If no one answers, please leave a voicemail as directed and we will return your call as soon as possible. Messages left after 4 pm will be answered the following business day.   You may also send [place] a message via [place]. We typically respond to MyChart messages within 1-2 business days.  For prescription refills, please ask your pharmacy to contact our office. Our fax number is [phone].  If you have an urgent issue when the clinic is closed that cannot wait until the next business day, you can page your doctor at the number below.    Please note that while we do our best to be available for urgent issues outside of office hours, we are not available 24/7.    If you have an urgent issue and are unable to reach [place], you may choose to seek medical care at your doctor's office, retail clinic, urgent care center, or emergency room.  If you have a medical emergency, please immediately call 911 or go to the emergency department.  Pager Numbers  - Dr. Nehemiah Massed: [phone]  - Dr. Laurence Ferrari: [phone]  - Dr. Nicole Kindred: [phone]  In the event of inclement weather, please call our main line at [phone] for an update on the status of any delays or closures.  Dermatology Medication Tips: Please keep the boxes that topical medications come in in order to help keep track of the instructions about where and how to use these. Pharmacies typically print the medication instructions only on the boxes and not directly on the medication tubes.   If your medication is too expensive, please contact our office at [phone] option 4 or send [place] a message through [place].   We are unable to tell what your co-pay for medications will be in advance as this is different depending on your insurance coverage. However, we may be able to find a substitute medication at lower cost or fill out paperwork to get insurance to cover a needed medication.   If a prior authorization is required to get your medication covered by your insurance company, please allow [place] 1-2 business days to complete this process.  Drug prices often vary depending on where the prescription is  filled and some pharmacies may offer cheaper prices.  The website www.goodrx.com contains coupons for medications through different pharmacies. The prices here do not account for what the cost may be with help from insurance (it may be cheaper with your insurance), but the website can give you the price if you did not use any insurance.  - You can print the associated coupon and take it with your prescription to the pharmacy.  - You may also stop by our office during regular business hours and pick up a  GoodRx coupon card.  - If you need your prescription sent electronically to a different pharmacy, notify our office through [facility] or by phone at [phone] option 4.

## 2020-12-25 NOTE — Progress Notes (Signed)
Follow-Up Visit   Subjective  Alex Smith is a 86 y.o. male who presents for the following: Follow-up (Patient here today for AK follow up. Patient did PDT to scalp in December and to face in January. He advises he had a good reaction. Patient does have a spot at left cheek and a few itchy spots at shoulders. Patient does have a hx of SCC, BCC and dysplastic nevi. ).  The following portions of the chart were reviewed this encounter and updated as appropriate:   Tobacco  Allergies  Meds  Problems  Med Hx  Surg Hx  Fam Hx     Review of Systems:  No other skin or systemic complaints except as noted in HPI or Assessment and Plan.  Objective  Well appearing patient in no apparent distress; mood and affect are within normal limits.  A focused examination was performed including face, neck, chest and back and scalp. Relevant physical exam findings are noted in the Assessment and Plan.  Objective  nose x 1, right cheek x 1, scalp x 5, right ear x 3 (10): Erythematous thin papules/macules with gritty scale.   Objective  left cheek: Crusted plaque 0.7cm     Objective  shoulders x 4: Erythematous keratotic or waxy stuck-on papule or plaque.    Assessment & Plan  AK (actinic keratosis) (10) nose x 1, right cheek x 1, scalp x 5, right ear x 3  Destruction of lesion - nose x 1, right cheek x 1, scalp x 5, right ear x 3 Complexity: simple   Destruction method: cryotherapy   Informed consent: discussed and consent obtained   Timeout:  patient name, date of birth, surgical site, and procedure verified Lesion destroyed using liquid nitrogen: Yes   Region frozen until ice ball extended beyond lesion: Yes   Outcome: patient tolerated procedure well with no complications   Post-procedure details: wound care instructions given    Neoplasm of uncertain behavior of skin left cheek  Skin / nail biopsy Type of biopsy: tangential   Informed consent: discussed and consent obtained    Timeout: patient name, date of birth, surgical site, and procedure verified   Procedure prep:  Patient was prepped and draped in usual sterile fashion Prep type:  Isopropyl alcohol Anesthesia: the lesion was anesthetized in a standard fashion   Anesthetic:  1% lidocaine w/ epinephrine 1-100,000 buffered w/ 8.4% NaHCO3 Instrument used: flexible razor blade   Hemostasis achieved with: pressure, aluminum chloride and electrodesiccation   Outcome: patient tolerated procedure well   Post-procedure details: sterile dressing applied and wound care instructions given   Dressing type: petrolatum and bandage    Specimen 1 - Surgical pathology Differential Diagnosis: r/o recurrent SCC  Check Margins: No Crusted plaque 0.7cm [id number]  Inflamed seborrheic keratosis shoulders x 4  Destruction of lesion - shoulders x 4 Complexity: simple   Destruction method: cryotherapy   Informed consent: discussed and consent obtained   Timeout:  patient name, date of birth, surgical site, and procedure verified Lesion destroyed using liquid nitrogen: Yes   Region frozen until ice ball extended beyond lesion: Yes   Outcome: patient tolerated procedure well with no complications   Post-procedure details: wound care instructions given     Actinic Damage - chronic, secondary to cumulative UV radiation exposure/sun exposure over time - diffuse scaly erythematous macules with underlying dyspigmentation - Recommend daily broad spectrum sunscreen SPF 30+ to sun-exposed areas, reapply every 2 hours as needed.  - Recommend staying  in the shade or wearing long sleeves, sun glasses (UVA+UVB protection) and wide brim hats (4-inch brim around the entire circumference of the hat). - Call for new or changing lesions.  Lentigines - Scattered tan macules - Due to sun exposure - Benign-appering, observe - Recommend daily broad spectrum sunscreen SPF 30+ to sun-exposed areas, reapply every 2 hours as needed. - Call  for any changes  Seborrheic Keratoses - Stuck-on, waxy, tan-brown papules and/or plaques  - Benign-appearing - Discussed benign etiology and prognosis. - Observe - Call for any changes  Hemangiomas - Red papules - Discussed benign nature - Observe - Call for any changes  History of Squamous Cell Carcinoma in Situ of the Skin - No evidence of recurrence today - Recommend regular full body skin exams - Recommend daily broad spectrum sunscreen SPF 30+ to sun-exposed areas, reapply every 2 hours as needed.  - Call if any new or changing lesions are noted between office visits  Return in about 6 months (around 06/27/2021) for AK follow up.  Graciella Belton, RMA, am acting as scribe for Sarina Ser, MD . Documentation: I have reviewed the above documentation for accuracy and completeness, and I agree with the above.  Sarina Ser, MD

## 2020-12-26 ENCOUNTER — Encounter: Payer: Self-pay | Admitting: Dermatology

## 2020-12-29 ENCOUNTER — Inpatient Hospital Stay: Payer: Medicare (Managed Care)

## 2021-01-01 ENCOUNTER — Telehealth: Payer: Self-pay

## 2021-01-01 NOTE — Telephone Encounter (Signed)
-----   Message from Ralene Bathe, MD sent at 01/01/2021 10:54 AM EDT ----- Diagnosis Skin , left cheek LICHEN SIMPLEX CHRONICUS OVERLYING DERMAL SCAR  Benign thickening of skin from rubbing or scratching As suspected Avoid touching area Recheck next visit

## 2021-01-01 NOTE — Telephone Encounter (Signed)
Advised patient of results/hd  

## 2021-01-05 ENCOUNTER — Other Ambulatory Visit: Payer: Self-pay | Admitting: *Deleted

## 2021-01-05 ENCOUNTER — Inpatient Hospital Stay: Payer: Medicare (Managed Care) | Attending: Hematology and Oncology

## 2021-01-05 ENCOUNTER — Inpatient Hospital Stay: Payer: Medicare (Managed Care)

## 2021-01-05 DIAGNOSIS — D469 Myelodysplastic syndrome, unspecified: Secondary | ICD-10-CM | POA: Insufficient documentation

## 2021-01-05 DIAGNOSIS — D46Z Other myelodysplastic syndromes: Secondary | ICD-10-CM

## 2021-01-05 LAB — COMPREHENSIVE METABOLIC PANEL
ALT: 12 U/L (ref 0–44)
AST: 21 U/L (ref 15–41)
Albumin: 3.6 g/dL (ref 3.5–5.0)
Alkaline Phosphatase: 65 U/L (ref 38–126)
Anion gap: 8 (ref 5–15)
BUN: 25 mg/dL — ABNORMAL HIGH (ref 8–23)
CO2: 23 mmol/L (ref 22–32)
Calcium: 8.5 mg/dL — ABNORMAL LOW (ref 8.9–10.3)
Chloride: 107 mmol/L (ref 98–111)
Creatinine, Ser: 1.54 mg/dL — ABNORMAL HIGH (ref 0.61–1.24)
GFR, Estimated: 44 mL/min — ABNORMAL LOW (ref >60)
Glucose, Bld: 269 mg/dL — ABNORMAL HIGH (ref 70–99)
Potassium: 3.9 mmol/L (ref 3.5–5.1)
Sodium: 138 mmol/L (ref 135–145)
Total Bilirubin: 0.8 mg/dL (ref 0.3–1.2)
Total Protein: 7.5 g/dL (ref 6.5–8.1)

## 2021-01-05 LAB — CBC WITH DIFFERENTIAL/PLATELET
Abs Immature Granulocytes: 0.01 10*3/uL (ref 0.00–0.07)
Basophils Absolute: 0 10*3/uL (ref 0.0–0.1)
Basophils Relative: 0 %
Eosinophils Absolute: 0.1 10*3/uL (ref 0.0–0.5)
Eosinophils Relative: 2 %
HCT: 28.6 % — ABNORMAL LOW (ref 39.0–52.0)
Hemoglobin: 9.7 g/dL — ABNORMAL LOW (ref 13.0–17.0)
Immature Granulocytes: 0 %
Lymphocytes Relative: 29 %
Lymphs Abs: 1.5 10*3/uL (ref 0.7–4.0)
MCH: 37 pg — ABNORMAL HIGH (ref 26.0–34.0)
MCHC: 33.9 g/dL (ref 30.0–36.0)
MCV: 109.2 fL — ABNORMAL HIGH (ref 80.0–100.0)
Monocytes Absolute: 0.3 10*3/uL (ref 0.1–1.0)
Monocytes Relative: 6 %
Neutro Abs: 3.1 10*3/uL (ref 1.7–7.7)
Neutrophils Relative %: 63 %
Platelets: 130 10*3/uL — ABNORMAL LOW (ref 150–400)
RBC: 2.62 MIL/uL — ABNORMAL LOW (ref 4.22–5.81)
RDW: 14.6 % (ref 11.5–15.5)
WBC: 5 10*3/uL (ref 4.0–10.5)
nRBC: 0 % (ref 0.0–0.2)

## 2021-01-05 MED ORDER — LUSPATERCEPT-AAMT 75 MG ~~LOC~~ SOLR
100.0000 mg | SUBCUTANEOUS | Status: DC
  Administered 2021-01-05: 100 mg via SUBCUTANEOUS
  Filled 2021-01-05: qty 0.5

## 2021-01-19 ENCOUNTER — Inpatient Hospital Stay: Payer: Medicare (Managed Care)

## 2021-02-01 ENCOUNTER — Ambulatory Visit: Payer: Medicare (Managed Care) | Admitting: Oncology

## 2021-02-01 ENCOUNTER — Ambulatory Visit: Payer: Medicare (Managed Care)

## 2021-02-01 ENCOUNTER — Other Ambulatory Visit: Payer: Medicare (Managed Care)

## 2021-02-02 ENCOUNTER — Inpatient Hospital Stay: Payer: Medicare (Managed Care) | Attending: Oncology

## 2021-02-02 ENCOUNTER — Inpatient Hospital Stay: Payer: Medicare (Managed Care)

## 2021-02-02 ENCOUNTER — Other Ambulatory Visit: Payer: Self-pay

## 2021-02-02 DIAGNOSIS — C9000 Multiple myeloma not having achieved remission: Secondary | ICD-10-CM | POA: Insufficient documentation

## 2021-02-02 DIAGNOSIS — D472 Monoclonal gammopathy: Secondary | ICD-10-CM

## 2021-02-02 DIAGNOSIS — D46Z Other myelodysplastic syndromes: Secondary | ICD-10-CM | POA: Diagnosis not present

## 2021-02-02 LAB — CBC WITH DIFFERENTIAL/PLATELET
Abs Immature Granulocytes: 0.01 10*3/uL (ref 0.00–0.07)
Basophils Absolute: 0 10*3/uL (ref 0.0–0.1)
Basophils Relative: 1 %
Eosinophils Absolute: 0.2 10*3/uL (ref 0.0–0.5)
Eosinophils Relative: 3 %
HCT: 29.6 % — ABNORMAL LOW (ref 39.0–52.0)
Hemoglobin: 9.9 g/dL — ABNORMAL LOW (ref 13.0–17.0)
Immature Granulocytes: 0 %
Lymphocytes Relative: 27 %
Lymphs Abs: 1.2 10*3/uL (ref 0.7–4.0)
MCH: 36.7 pg — ABNORMAL HIGH (ref 26.0–34.0)
MCHC: 33.4 g/dL (ref 30.0–36.0)
MCV: 109.6 fL — ABNORMAL HIGH (ref 80.0–100.0)
Monocytes Absolute: 0.3 10*3/uL (ref 0.1–1.0)
Monocytes Relative: 6 %
Neutro Abs: 2.8 10*3/uL (ref 1.7–7.7)
Neutrophils Relative %: 63 %
Platelets: 104 10*3/uL — ABNORMAL LOW (ref 150–400)
RBC: 2.7 MIL/uL — ABNORMAL LOW (ref 4.22–5.81)
RDW: 15.5 % (ref 11.5–15.5)
WBC: 4.4 10*3/uL (ref 4.0–10.5)
nRBC: 0.5 % — ABNORMAL HIGH (ref 0.0–0.2)

## 2021-02-02 LAB — COMPREHENSIVE METABOLIC PANEL
ALT: 16 U/L (ref 0–44)
AST: 24 U/L (ref 15–41)
Albumin: 3.7 g/dL (ref 3.5–5.0)
Alkaline Phosphatase: 95 U/L (ref 38–126)
Anion gap: 10 (ref 5–15)
BUN: 22 mg/dL (ref 8–23)
CO2: 23 mmol/L (ref 22–32)
Calcium: 8.4 mg/dL — ABNORMAL LOW (ref 8.9–10.3)
Chloride: 103 mmol/L (ref 98–111)
Creatinine, Ser: 1.59 mg/dL — ABNORMAL HIGH (ref 0.61–1.24)
GFR, Estimated: 43 mL/min — ABNORMAL LOW (ref >60)
Glucose, Bld: 375 mg/dL — ABNORMAL HIGH (ref 70–99)
Potassium: 3.7 mmol/L (ref 3.5–5.1)
Sodium: 136 mmol/L (ref 135–145)
Total Bilirubin: 0.8 mg/dL (ref 0.3–1.2)
Total Protein: 7.5 g/dL (ref 6.5–8.1)

## 2021-02-02 MED ORDER — LUSPATERCEPT-AAMT 75 MG ~~LOC~~ SOLR
100.0000 mg | SUBCUTANEOUS | Status: DC
  Administered 2021-02-02: 100 mg via SUBCUTANEOUS
  Filled 2021-02-02 (×2): qty 2

## 2021-02-05 LAB — KAPPA/LAMBDA LIGHT CHAINS
Kappa free light chain: 61.1 mg/L — ABNORMAL HIGH (ref 3.3–19.4)
Kappa, lambda light chain ratio: 4.49 — ABNORMAL HIGH (ref 0.26–1.65)
Lambda free light chains: 13.6 mg/L (ref 5.7–26.3)

## 2021-02-06 LAB — MULTIPLE MYELOMA PANEL, SERUM
Albumin SerPl Elph-Mcnc: 3.6 g/dL (ref 2.9–4.4)
Albumin/Glob SerPl: 1.1 (ref 0.7–1.7)
Alpha 1: 0.2 g/dL (ref 0.0–0.4)
Alpha2 Glob SerPl Elph-Mcnc: 0.7 g/dL (ref 0.4–1.0)
B-Globulin SerPl Elph-Mcnc: 0.9 g/dL (ref 0.7–1.3)
Gamma Glob SerPl Elph-Mcnc: 1.6 g/dL (ref 0.4–1.8)
Globulin, Total: 3.4 g/dL (ref 2.2–3.9)
IgA: 18 mg/dL — ABNORMAL LOW (ref 61–437)
IgG (Immunoglobin G), Serum: 1814 mg/dL — ABNORMAL HIGH (ref 603–1613)
IgM (Immunoglobulin M), Srm: 24 mg/dL (ref 15–143)
M Protein SerPl Elph-Mcnc: 1.5 g/dL — ABNORMAL HIGH
Total Protein ELP: 7 g/dL (ref 6.0–8.5)

## 2021-02-09 ENCOUNTER — Ambulatory Visit: Payer: Medicare (Managed Care) | Admitting: Oncology

## 2021-02-09 ENCOUNTER — Ambulatory Visit: Payer: Medicare (Managed Care)

## 2021-02-09 ENCOUNTER — Other Ambulatory Visit: Payer: Medicare (Managed Care)

## 2021-02-23 ENCOUNTER — Encounter: Payer: Self-pay | Admitting: Hematology and Oncology

## 2021-03-02 ENCOUNTER — Inpatient Hospital Stay: Payer: Medicare Other | Attending: Oncology

## 2021-03-02 ENCOUNTER — Inpatient Hospital Stay: Payer: Medicare Other

## 2021-03-02 ENCOUNTER — Encounter: Payer: Self-pay | Admitting: Oncology

## 2021-03-02 ENCOUNTER — Inpatient Hospital Stay (HOSPITAL_BASED_OUTPATIENT_CLINIC_OR_DEPARTMENT_OTHER): Payer: Medicare Other | Admitting: Oncology

## 2021-03-02 ENCOUNTER — Other Ambulatory Visit: Payer: Self-pay

## 2021-03-02 VITALS — BP 134/68 | HR 83 | Temp 98.5°F | Resp 18 | Wt 226.9 lb

## 2021-03-02 DIAGNOSIS — C9000 Multiple myeloma not having achieved remission: Secondary | ICD-10-CM

## 2021-03-02 DIAGNOSIS — D472 Monoclonal gammopathy: Secondary | ICD-10-CM

## 2021-03-02 DIAGNOSIS — D4620 Refractory anemia with excess of blasts, unspecified: Secondary | ICD-10-CM | POA: Diagnosis not present

## 2021-03-02 DIAGNOSIS — D46Z Other myelodysplastic syndromes: Secondary | ICD-10-CM | POA: Diagnosis not present

## 2021-03-02 DIAGNOSIS — D469 Myelodysplastic syndrome, unspecified: Secondary | ICD-10-CM | POA: Insufficient documentation

## 2021-03-02 LAB — CBC WITH DIFFERENTIAL/PLATELET
Abs Immature Granulocytes: 0.01 10*3/uL (ref 0.00–0.07)
Basophils Absolute: 0 10*3/uL (ref 0.0–0.1)
Basophils Relative: 1 %
Eosinophils Absolute: 0.1 10*3/uL (ref 0.0–0.5)
Eosinophils Relative: 3 %
HCT: 27.2 % — ABNORMAL LOW (ref 39.0–52.0)
Hemoglobin: 8.9 g/dL — ABNORMAL LOW (ref 13.0–17.0)
Immature Granulocytes: 0 %
Lymphocytes Relative: 33 %
Lymphs Abs: 1.3 10*3/uL (ref 0.7–4.0)
MCH: 36.5 pg — ABNORMAL HIGH (ref 26.0–34.0)
MCHC: 32.7 g/dL (ref 30.0–36.0)
MCV: 111.5 fL — ABNORMAL HIGH (ref 80.0–100.0)
Monocytes Absolute: 0.2 10*3/uL (ref 0.1–1.0)
Monocytes Relative: 6 %
Neutro Abs: 2.2 10*3/uL (ref 1.7–7.7)
Neutrophils Relative %: 57 %
Platelets: 118 10*3/uL — ABNORMAL LOW (ref 150–400)
RBC: 2.44 MIL/uL — ABNORMAL LOW (ref 4.22–5.81)
RDW: 15.3 % (ref 11.5–15.5)
WBC: 3.9 10*3/uL — ABNORMAL LOW (ref 4.0–10.5)
nRBC: 0 % (ref 0.0–0.2)

## 2021-03-02 LAB — COMPREHENSIVE METABOLIC PANEL
ALT: 15 U/L (ref 0–44)
AST: 22 U/L (ref 15–41)
Albumin: 3.6 g/dL (ref 3.5–5.0)
Alkaline Phosphatase: 66 U/L (ref 38–126)
Anion gap: 7 (ref 5–15)
BUN: 30 mg/dL — ABNORMAL HIGH (ref 8–23)
CO2: 22 mmol/L (ref 22–32)
Calcium: 8.5 mg/dL — ABNORMAL LOW (ref 8.9–10.3)
Chloride: 106 mmol/L (ref 98–111)
Creatinine, Ser: 1.46 mg/dL — ABNORMAL HIGH (ref 0.61–1.24)
GFR, Estimated: 47 mL/min — ABNORMAL LOW (ref >60)
Glucose, Bld: 266 mg/dL — ABNORMAL HIGH (ref 70–99)
Potassium: 4.1 mmol/L (ref 3.5–5.1)
Sodium: 135 mmol/L (ref 135–145)
Total Bilirubin: 0.7 mg/dL (ref 0.3–1.2)
Total Protein: 7.2 g/dL (ref 6.5–8.1)

## 2021-03-02 MED ORDER — LUSPATERCEPT-AAMT 75 MG ~~LOC~~ SOLR
100.0000 mg | SUBCUTANEOUS | Status: DC
  Administered 2021-03-02: 100 mg via SUBCUTANEOUS
  Filled 2021-03-02: qty 0.5

## 2021-03-03 ENCOUNTER — Encounter: Payer: Self-pay | Admitting: Hematology and Oncology

## 2021-03-03 NOTE — Progress Notes (Signed)
Hematology/Oncology Consult note [facility]  [phone] [phone]  Patient Care Team: Leonel Ramsay, MD as PCP - General (Infectious Diseases) Lequita Asal, MD as Referring Physician (Hematology and Oncology) Crissie Sickles, MD as Referring Physician (Hematology and Oncology) Crissie Sickles, MD as Referring Physician (Hematology and Oncology)   Name of the patient: Alex Smith  [id number]  [birth year]   Date of visit: 03/03/21  Diagnosis- History of hairy cell leukemia 2.  Smoldering multiple myeloma under observation 3.  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 [name] and is transitioning his care to me.  He has following issues:   1.  Hairy cell leukemia that was initially diagnosed on bone marrow biopsy in June 2016.  Bone marrow biopsy at that time showed extensive marrow involvement with recurrent hairy cell leukemia approximately 80% of the cells in the core 10% monoclonal plasma cell infiltrate compatible with plasma cell neoplasm.  Peripheral smear revealed leukopenia with a white count of 1900 compatible with hairy cell leukemia. 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  FISH studies for MDS were negative.  Cytogenetics were normal (46, XY).  He has last received cladribine for this back in July 2016   2.  Smoldering multiple myeloma: Bone marrow biopsy in March 2017 revealed persistent plasma cell neoplasm with monoclonal plasma cells estimated at 20 to 30%.  No morphologic evidence of residual hairy cell leukemia.Marrow was normocellular for age with relative erythroid hyperplasia, relative myeloid hypoplasia, mild dyspoiesis, adequate megakaryocytes and no  increased blasts.  There was no significant increase in marrow reticulin fibers.  Iron was present.  FISH studies for MDS were negative.  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  Cytogenetics were normal (46, XY).  Last bone marrow biopsy was in October 2018 which showed variably cellular marrow 10 to 50% with kappa restricted plasmacytosis of 10 to 15%.  No evidence of leukemia lymphoma or high-grade dysplasia.  Normal cytogenetics.  He has also been followed by Dr. [name] at [facility].  He has received Revlimid and Decadron in the past in 2017 which was subsequently stopped.  PET scan in July 2017 showed no hypermetabolic adenopathy or focal skeletal lesions.   3.  Probable MDS: He has received a trial of Procrit in the past in 2017 but apparently did not respond to it.  He has been getting periodic blood transfusions to maintain his hemoglobin closer to 9 as he is symptomatic when his hemoglobin is less than 9.  He has been getting about 2-3 blood transfusions during the year.  He does have some mild leukopenia/neutropenia and is on acyclovir prophylaxis.  He gets weekly G-CSF when his ANC is less than 1.5.   4.  Also has history of B12 deficiency for which he is on oral B12.    Interval history-patient reports no change in his ongoing symptoms of fatigue which has not really changed despite Luspatercept.  His transfusion dependence is gone down after starting Luspatercept.  ECOG PS- 1 Pain scale- 0   Review of systems- Review of Systems  Constitutional:  Positive for malaise/fatigue. Negative for chills, fever and weight loss.  HENT:  Negative for congestion, ear discharge and nosebleeds.  Eyes:  Negative for blurred vision.  Respiratory:  Negative for cough, hemoptysis, sputum production, shortness of breath and wheezing.   Cardiovascular:  Negative for chest pain, palpitations, orthopnea and claudication.  Gastrointestinal:  Negative for  abdominal pain, blood in stool, constipation, diarrhea, heartburn, melena, nausea and vomiting.  Genitourinary:  Negative for dysuria, flank pain, frequency, hematuria and urgency.  Musculoskeletal:  Negative for back pain, joint pain and myalgias.  Skin:  Negative for rash.  Neurological:  Negative for dizziness, tingling, focal weakness, seizures, weakness and headaches.  Endo/Heme/Allergies:  Does not bruise/bleed easily.  Psychiatric/Behavioral:  Negative for depression and suicidal ideas. The patient does not have insomnia.       Allergies  Allergen Reactions   Rituximab Rash    Chest tightness Chest tightness   Blood-Group Specific Substance Other (See Comments)    Had a post transfusion reaction of red blood cells; NOW REQUIRES WASHED BLOOD CELLS Had a post transfusion reaction of red blood cells; NOW REQUIRES WASHED BLOOD CELLS   Primaxin [Imipenem] Other (See Comments)    Possible allergy Possible allergy   Voriconazole Other (See Comments)   Sulfa Antibiotics Itching and Rash   Sulfacetamide Sodium Itching and Rash     Past Medical History:  Diagnosis Date   Anemia    B12 deficiency    Basal cell carcinoma 03/30/2018   left ant nasal ala   Basal cell carcinoma of face 01/19/2013   right distal dorsum nose   BPH (benign prostatic hypertrophy)    followed by urology, discharged (Dr. Bernardo Heater)   CAP (community acquired pneumonia) 02/15/2015   CKD stage 3 due to type 2 diabetes mellitus ([place]) 11/14/2017   Colon polyps    Diverticulosis    Dysplastic nevus 10/27/2006   right med calf   GERD (gastroesophageal reflux disease)    Hairy cell leukemia ([place]) 2006   recurrent, seizure on rituxan, now on cladribine [name])   History of pneumonia 2000's   "once" (07/07/2012)   History of shingles    HLD (hyperlipidemia)    Hypertension    Pneumonia    Shortness of breath dyspnea    Squamous cell carcinoma in situ 07/05/2020   Left lat. pretibial. EDC 09/07/2020    Squamous cell carcinoma in situ 07/05/2020   Right medial pretibial. EDC 09/07/2020   Squamous cell carcinoma of skin 04/03/2020   Left cheek. WD SCC   Systolic murmur 95/28/4132   Type 2 diabetes, controlled, with retinopathy ([place])      Past Surgical History:  Procedure Laterality Date   25 GAUGE PARS PLANA VITRECTOMY WITH 20 GAUGE MVR PORT FOR MACULAR HOLE  07/07/2012   Procedure: 25 GAUGE PARS PLANA VITRECTOMY WITH 20 GAUGE MVR PORT FOR MACULAR HOLE;  Surgeon: Hayden Pedro, MD;  Location: [place];  Service: Ophthalmology;  Laterality: Left;   BONE MARROW BIOPSY  2016   CARDIOVASCULAR STRESS TEST  2013   treadmill - no evidence ischemia, EF 61%   CATARACT EXTRACTION W/ INTRAOCULAR LENS  IMPLANT, BILATERAL  ~ 2010   COLONOSCOPY  2014   [place] WNL no rpt needed, h/o polyps   EYE SURGERY Left 06/2012   laser surgery   GAS INSERTION  07/07/2012   Procedure: INSERTION OF GAS;  Surgeon: Hayden Pedro, MD;  Location: [place];  Service: Ophthalmology;  Laterality: Left;  [id number]   PERIPHERAL VASCULAR CATHETERIZATION N/A 02/23/2015   Procedure: Glori Luis Cath Insertion;  Surgeon: Algernon Huxley, MD;  Location: [place]  CV [facility];  Service: Cardiovascular;  Laterality: N/A;   PORTA CATH REMOVAL N/A 02/16/2020   Procedure: PORTA CATH REMOVAL;  Surgeon: Algernon Huxley, MD;  Location: [place] [facility];  Service: Cardiovascular;  Laterality: N/A;   SERUM PATCH  07/07/2012   Procedure: SERUM PATCH;  Surgeon: Hayden Pedro, MD;  Location: [place];  Service: Ophthalmology;  Laterality: Left;   SKIN CANCER EXCISION     "all over my face" (07/07/2012)    Social History   Socioeconomic History   Marital status: Married    Spouse name: Not on file   Number of children: Not on file   Years of education: Not on file   Highest education level: Not on file  Occupational History   Not on file  Tobacco Use   Smoking status: Former    Packs/day: 2.00    Years: 25.00    Pack years: 50.00    Types:  Cigarettes    Quit date: 02/06/1969    Years since quitting: 52.1   Smokeless tobacco: Never   Tobacco comments:    07/07/2012 "stopped smoking ~ 40 yr ago; smoked 20-102yr  Vaping Use   Vaping Use: Never used  Substance and Sexual Activity   Alcohol use: Yes    Alcohol/week: 0.0 standard drinks    Comment: rare   Drug use: No   Sexual activity: Never  Other Topics Concern   Not on file  Social History Narrative   Lives at home alone. 1 cat   Occupation: was cArt gallery manager works part time for [employer]   Activity: likes to golf   Diet: moderate water, fruits/vegetables daily   Social Determinants of HRadio broadcast assistantStrain: Not on file  Food Insecurity: Not on file  Transportation Needs: Not on file  Physical Activity: Not on file  Stress: Not on file  Social Connections: Not on file  Intimate Partner Violence: Not on file    Family History  Problem Relation Age of Onset   Dementia Mother    Heart failure Father 743  Cancer Sister        breast   Diabetes Paternal Uncle    Diabetes Paternal Aunt    CAD Brother [phone]      MI   Stroke Neg Hx      Current Outpatient Medications:    aspirin EC 81 MG tablet, Take 81 mg by mouth daily., Disp: , Rfl:    ciprofloxacin (CIPRO) 500 MG tablet, Take 1 tablet by mouth 2 (two) times daily., Disp: , Rfl:    DIPHENHYDRAMINE-ACETAMINOPHEN PO, Take 1 tablet by mouth at bedtime., Disp: , Rfl:    doxazosin (CARDURA) 1 MG tablet, TAKE ONE TABLET BY MOUTH EVERY DAY, Disp: 30 tablet, Rfl: 5   doxycycline (VIBRAMYCIN) 100 MG capsule, Take 1 capsule by mouth 2 (two) times daily., Disp: , Rfl:    famotidine (PEPCID) 40 MG tablet, Take 0.5 tablets (20 mg total) by mouth daily. NEEDS OFFICE VISIT, Disp: 15 tablet, Rfl: 0   glucose blood (ACCU-CHEK AVIVA PLUS) test strip, USE TO CHECK SUGAR DAILY E11.319, Disp: 200 each, Rfl: 3   HUMULIN R 100 UNIT/ML injection, INJECT 10 UNITS INTO THE SKIN TWICE DAILY WITH MEALS, Disp: 10 mL,  Rfl: 5   hydrochlorothiazide (HYDRODIURIL) 25 MG tablet, TAKE 1 TABLET BY MOUTH DAILY, Disp: 90 tablet, Rfl: 1   lisinopril (ZESTRIL) 20 MG tablet, TAKE ONE TABLET BY MOUTH TWICE DAILY, Disp: 180 tablet,  Rfl: 0   Microlet Lancets MISC, USE AS DIRECTED, Disp: 200 each, Rfl: 3   omeprazole (PRILOSEC) 20 MG capsule, Take 1 capsule by mouth 1 day or 1 dose., Disp: , Rfl:    pravastatin (PRAVACHOL) 20 MG tablet, TAKE ONE TABLET EVERY EVENING, Disp: 30 tablet, Rfl: 0   temazepam (RESTORIL) 7.5 MG capsule, Take 1 capsule by mouth daily., Disp: , Rfl:    traZODone (DESYREL) 50 MG tablet, TAKE 1/2 TO 1 TABLET BY MOUTH AT BEDTIMEAS NEEDED FOR SLEEP, Disp: 30 tablet, Rfl: 0   ULTICARE INSULIN SYRINGE 31G X 5/16" 0.5 ML MISC, See admin instructions., Disp: , Rfl:    vitamin B-12 (CYANOCOBALAMIN) 1000 MCG tablet, Take 1 tablet (1,000 mcg total) by mouth daily., Disp: 90 tablet, Rfl: 3   vitamin E 400 UNIT capsule, Take 200 Units by mouth daily. , Disp: , Rfl:    HUMULIN N 100 UNIT/ML injection, INJECT 39 UNITS SUBCUTANEOUSLY TWICE DAILY BEFORE A MEAL (Patient taking differently: Inject 1 Units into the skin as directed. Pt uses this as sliding scale), Disp: 20 mL, Rfl: 2   tamsulosin (FLOMAX) 0.4 MG CAPS capsule, TAKE 1 CAPSULE BY MOUTH EVERY DAY (Patient not taking: Reported on 10/27/2020), Disp: 30 capsule, Rfl: 3  Current Facility-Administered Medications:    luspatercept-aamt (REBLOZYL) subcutaneous injection 100 mg, 100 mg, Subcutaneous, Q21 days, Sindy Guadeloupe, MD, 100 mg at 03/02/21 1456   Tbo-Filgrastim (GRANIX) injection 480 mcg, 480 mcg, Subcutaneous, Once, Corcoran, Melissa C, MD  Facility-Administered Medications Ordered in Other Visits:    heparin lock flush 100 unit/mL, 500 Units, Intravenous, Once, Corcoran, Melissa C, MD   luspatercept-aamt (REBLOZYL) subcutaneous injection 100 mg, 100 mg, Subcutaneous, Q30 days, Sindy Guadeloupe, MD   luspatercept-aamt (REBLOZYL) subcutaneous injection 100 mg,  100 mg, Subcutaneous, Q30 days, Sindy Guadeloupe, MD, 100 mg at 11/18/19 1252   luspatercept-aamt (REBLOZYL) subcutaneous injection 100 mg, 100 mg, Subcutaneous, Q21 days, Sindy Guadeloupe, MD, 100 mg at 02/10/20 1435   luspatercept-aamt (REBLOZYL) subcutaneous injection 100 mg, 100 mg, Subcutaneous, Q21 days, [name], Melissa C, MD, 100 mg at 03/02/20 1452   luspatercept-aamt (REBLOZYL) subcutaneous injection 100 mg, 100 mg, Subcutaneous, Q21 days, [name], Melissa C, MD, 100 mg at 05/05/20 1447   luspatercept-aamt (REBLOZYL) subcutaneous injection 100 mg, 100 mg, Subcutaneous, Q21 days, [name], Melissa C, MD, 100 mg at 07/28/20 1447   sodium chloride 0.9 % injection 10 mL, 10 mL, Intravenous, PRN, [name], Melissa C, MD, 10 mL at 03/03/15 0903   sodium chloride 0.9 % injection 10 mL, 10 mL, Intracatheter, PRN, [name], Melissa C, MD, 10 mL at 03/10/15 1410   sodium chloride flush (NS) 0.9 % injection 10 mL, 10 mL, Intravenous, PRN, [name], Melissa C, MD, 10 mL at 07/23/18 1517  Physical exam:  Vitals:   03/02/21 1356  BP: 134/68  Pulse: 83  Resp: 18  Temp: 98.5 F (36.9 C)  SpO2: 98%  Weight: 226 lb 14.4 oz (102.9 kg)   Physical Exam Constitutional:      General: He is not in acute distress. Cardiovascular:     Rate and Rhythm: Normal rate and regular rhythm.     Heart sounds: Normal heart sounds.  Pulmonary:     Effort: Pulmonary effort is normal.     Breath sounds: Normal breath sounds.  Abdominal:     General: Bowel sounds are normal.     Palpations: Abdomen is soft.  Skin:    General: Skin is warm and  dry.  Neurological:     Mental Status: He is alert and oriented to person, place, and time.     CMP Latest Ref Rng & Units 03/02/2021  Glucose 70 - 99 mg/dL 266(H)  BUN 8 - 23 mg/dL 30(H)  Creatinine 0.61 - 1.24 mg/dL 1.46(H)  Sodium 135 - 145 mmol/L 135  Potassium 3.5 - 5.1 mmol/L 4.1  Chloride 98 - 111 mmol/L 106  CO2 22 - 32 mmol/L 22  Calcium 8.9 - 10.3 mg/dL  8.5(L)  Total Protein 6.5 - 8.1 g/dL 7.2  Total Bilirubin 0.3 - 1.2 mg/dL 0.7  Alkaline Phos 38 - 126 U/L 66  AST 15 - 41 U/L 22  ALT 0 - 44 U/L 15   CBC Latest Ref Rng & Units 03/02/2021  WBC 4.0 - 10.5 K/uL 3.9(L)  Hemoglobin 13.0 - 17.0 g/dL 8.9(L)  Hematocrit 39.0 - 52.0 % 27.2(L)  Platelets 150 - 400 K/uL 118(L)     Assessment and plan- Patient is a 86 y.o. male who is here for follow-up of following issues:  Low-grade MDS on Luspatercept: Luspatercept is now being given every 4 weeks instead of every 3 weeks as he was receiving previously.His hemoglobin has trended down a little bit from 9.5-8.9 today.  I will continue Luspatercept every 4 weeks at this time however if there is a continued downward trend in his hemoglobin I will consider moving it back to every 3 weeks.  2.  Smoldering multiple myeloma: M protein has remained stable around 1.5.  Serum free light chain ratio remained stable between 4-6 over the last 2 years.  Most recent PET/CT scan in December 2021 did not show any evidence of active myeloma.  I therefore do not feel that his hemoglobin is getting worse due to overt myeloma and rather it seems to be driven by his MDS.  CBC with differential, CMP and patient will continue to receive Luspatercept every 4 weeks and I will see him back in 3 months with CBC ferritin and iron studies B12 folate myeloma panel and serum free light chains   Visit Diagnosis 1. Myelodysplasia present in bone marrow ([place])   2. Smoldering myeloma ([place])   3. Myelodysplastic syndrome, low grade ([facility])      Dr. Randa Evens, MD, MPH [facility] [phone] 03/03/2021 9:07 PM

## 2021-03-30 ENCOUNTER — Other Ambulatory Visit: Payer: Self-pay

## 2021-03-30 ENCOUNTER — Inpatient Hospital Stay: Payer: Medicare Other

## 2021-03-30 ENCOUNTER — Inpatient Hospital Stay: Payer: Medicare Other | Attending: Oncology

## 2021-03-30 DIAGNOSIS — D472 Monoclonal gammopathy: Secondary | ICD-10-CM

## 2021-03-30 DIAGNOSIS — C9000 Multiple myeloma not having achieved remission: Secondary | ICD-10-CM

## 2021-03-30 DIAGNOSIS — D46Z Other myelodysplastic syndromes: Secondary | ICD-10-CM

## 2021-03-30 DIAGNOSIS — E538 Deficiency of other specified B group vitamins: Secondary | ICD-10-CM | POA: Diagnosis not present

## 2021-03-30 DIAGNOSIS — D469 Myelodysplastic syndrome, unspecified: Secondary | ICD-10-CM

## 2021-03-30 LAB — COMPREHENSIVE METABOLIC PANEL
ALT: 16 U/L (ref 0–44)
AST: 23 U/L (ref 15–41)
Albumin: 3.8 g/dL (ref 3.5–5.0)
Alkaline Phosphatase: 59 U/L (ref 38–126)
Anion gap: 6 (ref 5–15)
BUN: 26 mg/dL — ABNORMAL HIGH (ref 8–23)
CO2: 22 mmol/L (ref 22–32)
Calcium: 8.3 mg/dL — ABNORMAL LOW (ref 8.9–10.3)
Chloride: 108 mmol/L (ref 98–111)
Creatinine, Ser: 1.44 mg/dL — ABNORMAL HIGH (ref 0.61–1.24)
GFR, Estimated: 48 mL/min — ABNORMAL LOW (ref >60)
Glucose, Bld: 229 mg/dL — ABNORMAL HIGH (ref 70–99)
Potassium: 4.1 mmol/L (ref 3.5–5.1)
Sodium: 136 mmol/L (ref 135–145)
Total Bilirubin: 0.7 mg/dL (ref 0.3–1.2)
Total Protein: 7.3 g/dL (ref 6.5–8.1)

## 2021-03-30 LAB — CBC WITH DIFFERENTIAL/PLATELET
Abs Immature Granulocytes: 0.01 10*3/uL (ref 0.00–0.07)
Basophils Absolute: 0 10*3/uL (ref 0.0–0.1)
Basophils Relative: 1 %
Eosinophils Absolute: 0.1 10*3/uL (ref 0.0–0.5)
Eosinophils Relative: 3 %
HCT: 29.5 % — ABNORMAL LOW (ref 39.0–52.0)
Hemoglobin: 9.7 g/dL — ABNORMAL LOW (ref 13.0–17.0)
Immature Granulocytes: 0 %
Lymphocytes Relative: 29 %
Lymphs Abs: 0.9 10*3/uL (ref 0.7–4.0)
MCH: 36.1 pg — ABNORMAL HIGH (ref 26.0–34.0)
MCHC: 32.9 g/dL (ref 30.0–36.0)
MCV: 109.7 fL — ABNORMAL HIGH (ref 80.0–100.0)
Monocytes Absolute: 0.2 10*3/uL (ref 0.1–1.0)
Monocytes Relative: 7 %
Neutro Abs: 1.9 10*3/uL (ref 1.7–7.7)
Neutrophils Relative %: 60 %
Platelets: 100 10*3/uL — ABNORMAL LOW (ref 150–400)
RBC: 2.69 MIL/uL — ABNORMAL LOW (ref 4.22–5.81)
RDW: 14.8 % (ref 11.5–15.5)
WBC: 3.1 10*3/uL — ABNORMAL LOW (ref 4.0–10.5)
nRBC: 0 % (ref 0.0–0.2)

## 2021-03-30 LAB — IRON AND TIBC
Iron: 133 ug/dL (ref 45–182)
Saturation Ratios: 37 % (ref 17.9–39.5)
TIBC: 357 ug/dL (ref 250–450)
UIBC: 224 ug/dL

## 2021-03-30 LAB — FOLATE: Folate: 19.7 ng/mL (ref >5.9)

## 2021-03-30 LAB — FERRITIN: Ferritin: 109 ng/mL (ref 24–336)

## 2021-03-30 LAB — VITAMIN B12: Vitamin B-12: 445 pg/mL (ref 180–914)

## 2021-03-30 MED ORDER — LUSPATERCEPT-AAMT 75 MG ~~LOC~~ SOLR
100.0000 mg | SUBCUTANEOUS | Status: DC
  Administered 2021-03-30: 100 mg via SUBCUTANEOUS
  Filled 2021-03-30: qty 1.5

## 2021-03-30 NOTE — Progress Notes (Signed)
ERROR ENCOUNTER

## 2021-04-02 LAB — KAPPA/LAMBDA LIGHT CHAINS
Kappa free light chain: 64.9 mg/L — ABNORMAL HIGH (ref 3.3–19.4)
Kappa, lambda light chain ratio: 5.79 — ABNORMAL HIGH (ref 0.26–1.65)
Lambda free light chains: 11.2 mg/L (ref 5.7–26.3)

## 2021-04-03 LAB — MULTIPLE MYELOMA PANEL, SERUM
Albumin SerPl Elph-Mcnc: 3.6 g/dL (ref 2.9–4.4)
Albumin/Glob SerPl: 1.1 (ref 0.7–1.7)
Alpha 1: 0.2 g/dL (ref 0.0–0.4)
Alpha2 Glob SerPl Elph-Mcnc: 0.6 g/dL (ref 0.4–1.0)
B-Globulin SerPl Elph-Mcnc: 0.8 g/dL (ref 0.7–1.3)
Gamma Glob SerPl Elph-Mcnc: 1.7 g/dL (ref 0.4–1.8)
Globulin, Total: 3.3 g/dL (ref 2.2–3.9)
IgA: 16 mg/dL — ABNORMAL LOW (ref 61–437)
IgG (Immunoglobin G), Serum: 1949 mg/dL — ABNORMAL HIGH (ref 603–1613)
IgM (Immunoglobulin M), Srm: 22 mg/dL (ref 15–143)
M Protein SerPl Elph-Mcnc: 1.4 g/dL — ABNORMAL HIGH
Total Protein ELP: 6.9 g/dL (ref 6.0–8.5)

## 2021-04-17 ENCOUNTER — Encounter: Payer: Self-pay | Admitting: Hematology and Oncology

## 2021-04-24 ENCOUNTER — Encounter (INDEPENDENT_AMBULATORY_CARE_PROVIDER_SITE_OTHER): Payer: Medicare (Managed Care) | Admitting: Ophthalmology

## 2021-04-27 ENCOUNTER — Inpatient Hospital Stay: Payer: Medicare Other | Attending: Oncology

## 2021-04-27 ENCOUNTER — Inpatient Hospital Stay: Payer: Medicare Other

## 2021-04-27 DIAGNOSIS — D46Z Other myelodysplastic syndromes: Secondary | ICD-10-CM

## 2021-04-27 DIAGNOSIS — D469 Myelodysplastic syndrome, unspecified: Secondary | ICD-10-CM | POA: Diagnosis not present

## 2021-04-27 LAB — COMPREHENSIVE METABOLIC PANEL
ALT: 16 U/L (ref 0–44)
AST: 23 U/L (ref 15–41)
Albumin: 4 g/dL (ref 3.5–5.0)
Alkaline Phosphatase: 74 U/L (ref 38–126)
Anion gap: 6 (ref 5–15)
BUN: 21 mg/dL (ref 8–23)
CO2: 25 mmol/L (ref 22–32)
Calcium: 8.7 mg/dL — ABNORMAL LOW (ref 8.9–10.3)
Chloride: 107 mmol/L (ref 98–111)
Creatinine, Ser: 1.44 mg/dL — ABNORMAL HIGH (ref 0.61–1.24)
GFR, Estimated: 48 mL/min — ABNORMAL LOW (ref >60)
Glucose, Bld: 310 mg/dL — ABNORMAL HIGH (ref 70–99)
Potassium: 3.8 mmol/L (ref 3.5–5.1)
Sodium: 138 mmol/L (ref 135–145)
Total Bilirubin: 1.1 mg/dL (ref 0.3–1.2)
Total Protein: 7.8 g/dL (ref 6.5–8.1)

## 2021-04-27 LAB — CBC WITH DIFFERENTIAL/PLATELET
Abs Immature Granulocytes: 0.01 10*3/uL (ref 0.00–0.07)
Basophils Absolute: 0 10*3/uL (ref 0.0–0.1)
Basophils Relative: 1 %
Eosinophils Absolute: 0.1 10*3/uL (ref 0.0–0.5)
Eosinophils Relative: 3 %
HCT: 31.7 % — ABNORMAL LOW (ref 39.0–52.0)
Hemoglobin: 10.6 g/dL — ABNORMAL LOW (ref 13.0–17.0)
Immature Granulocytes: 0 %
Lymphocytes Relative: 28 %
Lymphs Abs: 1.2 10*3/uL (ref 0.7–4.0)
MCH: 35.6 pg — ABNORMAL HIGH (ref 26.0–34.0)
MCHC: 33.4 g/dL (ref 30.0–36.0)
MCV: 106.4 fL — ABNORMAL HIGH (ref 80.0–100.0)
Monocytes Absolute: 0.3 10*3/uL (ref 0.1–1.0)
Monocytes Relative: 7 %
Neutro Abs: 2.5 10*3/uL (ref 1.7–7.7)
Neutrophils Relative %: 61 %
Platelets: 109 10*3/uL — ABNORMAL LOW (ref 150–400)
RBC: 2.98 MIL/uL — ABNORMAL LOW (ref 4.22–5.81)
RDW: 14.9 % (ref 11.5–15.5)
WBC: 4.1 10*3/uL (ref 4.0–10.5)
nRBC: 0.5 % — ABNORMAL HIGH (ref 0.0–0.2)

## 2021-04-27 MED ORDER — LUSPATERCEPT-AAMT 75 MG ~~LOC~~ SOLR
100.0000 mg | SUBCUTANEOUS | Status: DC
  Administered 2021-04-27: 100 mg via SUBCUTANEOUS
  Filled 2021-04-27: qty 1.5

## 2021-04-29 ENCOUNTER — Other Ambulatory Visit: Payer: Self-pay

## 2021-04-29 DIAGNOSIS — Z8619 Personal history of other infectious and parasitic diseases: Secondary | ICD-10-CM

## 2021-04-29 DIAGNOSIS — I129 Hypertensive chronic kidney disease with stage 1 through stage 4 chronic kidney disease, or unspecified chronic kidney disease: Secondary | ICD-10-CM | POA: Diagnosis present

## 2021-04-29 DIAGNOSIS — Z6839 Body mass index (BMI) 39.0-39.9, adult: Secondary | ICD-10-CM

## 2021-04-29 DIAGNOSIS — N1831 Chronic kidney disease, stage 3a: Secondary | ICD-10-CM | POA: Diagnosis present

## 2021-04-29 DIAGNOSIS — J189 Pneumonia, unspecified organism: Secondary | ICD-10-CM | POA: Diagnosis present

## 2021-04-29 DIAGNOSIS — R6520 Severe sepsis without septic shock: Secondary | ICD-10-CM | POA: Diagnosis present

## 2021-04-29 DIAGNOSIS — A419 Sepsis, unspecified organism: Principal | ICD-10-CM | POA: Diagnosis present

## 2021-04-29 DIAGNOSIS — E538 Deficiency of other specified B group vitamins: Secondary | ICD-10-CM | POA: Diagnosis present

## 2021-04-29 DIAGNOSIS — N400 Enlarged prostate without lower urinary tract symptoms: Secondary | ICD-10-CM | POA: Diagnosis present

## 2021-04-29 DIAGNOSIS — Z833 Family history of diabetes mellitus: Secondary | ICD-10-CM

## 2021-04-29 DIAGNOSIS — Z7982 Long term (current) use of aspirin: Secondary | ICD-10-CM

## 2021-04-29 DIAGNOSIS — Z23 Encounter for immunization: Secondary | ICD-10-CM

## 2021-04-29 DIAGNOSIS — K219 Gastro-esophageal reflux disease without esophagitis: Secondary | ICD-10-CM | POA: Diagnosis present

## 2021-04-29 DIAGNOSIS — S0083XA Contusion of other part of head, initial encounter: Secondary | ICD-10-CM | POA: Diagnosis present

## 2021-04-29 DIAGNOSIS — L89611 Pressure ulcer of right heel, stage 1: Secondary | ICD-10-CM | POA: Diagnosis not present

## 2021-04-29 DIAGNOSIS — Z856 Personal history of leukemia: Secondary | ICD-10-CM

## 2021-04-29 DIAGNOSIS — Z87891 Personal history of nicotine dependence: Secondary | ICD-10-CM

## 2021-04-29 DIAGNOSIS — Z20822 Contact with and (suspected) exposure to covid-19: Secondary | ICD-10-CM | POA: Diagnosis present

## 2021-04-29 DIAGNOSIS — D696 Thrombocytopenia, unspecified: Secondary | ICD-10-CM | POA: Diagnosis present

## 2021-04-29 DIAGNOSIS — D649 Anemia, unspecified: Secondary | ICD-10-CM | POA: Diagnosis present

## 2021-04-29 DIAGNOSIS — Z9181 History of falling: Secondary | ICD-10-CM

## 2021-04-29 DIAGNOSIS — E1122 Type 2 diabetes mellitus with diabetic chronic kidney disease: Secondary | ICD-10-CM | POA: Diagnosis present

## 2021-04-29 DIAGNOSIS — Z85828 Personal history of other malignant neoplasm of skin: Secondary | ICD-10-CM

## 2021-04-29 DIAGNOSIS — Z86008 Personal history of in-situ neoplasm of other site: Secondary | ICD-10-CM

## 2021-04-29 DIAGNOSIS — Z8249 Family history of ischemic heart disease and other diseases of the circulatory system: Secondary | ICD-10-CM

## 2021-04-29 DIAGNOSIS — Z794 Long term (current) use of insulin: Secondary | ICD-10-CM

## 2021-04-29 DIAGNOSIS — W010XXA Fall on same level from slipping, tripping and stumbling without subsequent striking against object, initial encounter: Secondary | ICD-10-CM | POA: Diagnosis present

## 2021-04-29 DIAGNOSIS — G9341 Metabolic encephalopathy: Secondary | ICD-10-CM | POA: Diagnosis present

## 2021-04-29 DIAGNOSIS — E785 Hyperlipidemia, unspecified: Secondary | ICD-10-CM | POA: Diagnosis present

## 2021-04-29 DIAGNOSIS — K5790 Diverticulosis of intestine, part unspecified, without perforation or abscess without bleeding: Secondary | ICD-10-CM | POA: Diagnosis present

## 2021-04-29 DIAGNOSIS — Z8701 Personal history of pneumonia (recurrent): Secondary | ICD-10-CM

## 2021-04-29 DIAGNOSIS — Z79899 Other long term (current) drug therapy: Secondary | ICD-10-CM

## 2021-04-29 DIAGNOSIS — E1165 Type 2 diabetes mellitus with hyperglycemia: Secondary | ICD-10-CM | POA: Diagnosis present

## 2021-04-29 DIAGNOSIS — D7589 Other specified diseases of blood and blood-forming organs: Secondary | ICD-10-CM | POA: Diagnosis present

## 2021-04-29 DIAGNOSIS — Z882 Allergy status to sulfonamides status: Secondary | ICD-10-CM

## 2021-04-29 DIAGNOSIS — Z888 Allergy status to other drugs, medicaments and biological substances status: Secondary | ICD-10-CM

## 2021-04-29 LAB — CBC
HCT: 31.9 % — ABNORMAL LOW (ref 39.0–52.0)
Hemoglobin: 11 g/dL — ABNORMAL LOW (ref 13.0–17.0)
MCH: 36.3 pg — ABNORMAL HIGH (ref 26.0–34.0)
MCHC: 34.5 g/dL (ref 30.0–36.0)
MCV: 105.3 fL — ABNORMAL HIGH (ref 80.0–100.0)
Platelets: 113 10*3/uL — ABNORMAL LOW (ref 150–400)
RBC: 3.03 MIL/uL — ABNORMAL LOW (ref 4.22–5.81)
RDW: 14.9 % (ref 11.5–15.5)
WBC: 4.8 10*3/uL (ref 4.0–10.5)
nRBC: 0.4 % — ABNORMAL HIGH (ref 0.0–0.2)

## 2021-04-29 NOTE — ED Triage Notes (Addendum)
Per ems pt with fall yesterday while in [place]. Per ems pt with fall tonight and weakness. Pt with clear speech, sutured laceration to right hand, finger splint, right knee abrasion, left forehead abrasion, eye ecchymosis and chin ecchymosis noted. Pt states he took himself off his blood pressure medication as well. Per daughter has been weak "all weekend". Pt is alert to self and place only at this time. Per pt he fell onto his buttocks tonight and did not strike his head. Pt also complains of left upper abd pain.

## 2021-04-30 ENCOUNTER — Emergency Department: Payer: Medicare Other

## 2021-04-30 ENCOUNTER — Inpatient Hospital Stay
Admission: EM | Admit: 2021-04-30 | Discharge: 2021-05-11 | DRG: 871 | Disposition: A | Discharge status: Skilled Nursing Facility | Payer: Medicare Other | Attending: Internal Medicine | Admitting: Internal Medicine

## 2021-04-30 DIAGNOSIS — R509 Fever, unspecified: Secondary | ICD-10-CM

## 2021-04-30 DIAGNOSIS — M25552 Pain in left hip: Secondary | ICD-10-CM

## 2021-04-30 DIAGNOSIS — J189 Pneumonia, unspecified organism: Secondary | ICD-10-CM | POA: Diagnosis present

## 2021-04-30 DIAGNOSIS — N179 Acute kidney failure, unspecified: Secondary | ICD-10-CM

## 2021-04-30 DIAGNOSIS — A419 Sepsis, unspecified organism: Secondary | ICD-10-CM | POA: Diagnosis present

## 2021-04-30 DIAGNOSIS — R531 Weakness: Secondary | ICD-10-CM

## 2021-04-30 DIAGNOSIS — W19XXXA Unspecified fall, initial encounter: Secondary | ICD-10-CM

## 2021-04-30 DIAGNOSIS — R6520 Severe sepsis without septic shock: Secondary | ICD-10-CM | POA: Diagnosis present

## 2021-04-30 LAB — CBG MONITORING, ED: Glucose-Capillary: 287 mg/dL — ABNORMAL HIGH (ref 70–99)

## 2021-04-30 LAB — URINALYSIS, COMPLETE (UACMP) WITH MICROSCOPIC
Bacteria, UA: NONE SEEN
Bilirubin Urine: NEGATIVE
Glucose, UA: 100 mg/dL — AB
Leukocytes,Ua: NEGATIVE
Nitrite: NEGATIVE
Protein, ur: >300 mg/dL — AB
Specific Gravity, Urine: >1.03 — ABNORMAL HIGH (ref 1.005–1.030)
Squamous Epithelial / HPF: NONE SEEN (ref 0–5)
pH: 5.5 (ref 5.0–8.0)

## 2021-04-30 LAB — COMPREHENSIVE METABOLIC PANEL
ALT: 18 U/L (ref 0–44)
AST: 30 U/L (ref 15–41)
Albumin: 4 g/dL (ref 3.5–5.0)
Alkaline Phosphatase: 72 U/L (ref 38–126)
Anion gap: 8 (ref 5–15)
BUN: 24 mg/dL — ABNORMAL HIGH (ref 8–23)
CO2: 22 mmol/L (ref 22–32)
Calcium: 8.9 mg/dL (ref 8.9–10.3)
Chloride: 107 mmol/L (ref 98–111)
Creatinine, Ser: 1.48 mg/dL — ABNORMAL HIGH (ref 0.61–1.24)
GFR, Estimated: 46 mL/min — ABNORMAL LOW (ref >60)
Glucose, Bld: 228 mg/dL — ABNORMAL HIGH (ref 70–99)
Potassium: 4 mmol/L (ref 3.5–5.1)
Sodium: 137 mmol/L (ref 135–145)
Total Bilirubin: 1.6 mg/dL — ABNORMAL HIGH (ref 0.3–1.2)
Total Protein: 7.7 g/dL (ref 6.5–8.1)

## 2021-04-30 LAB — RESP PANEL BY RT-PCR (FLU A&B, COVID) ARPGX2
Influenza A by PCR: NEGATIVE
Influenza B by PCR: NEGATIVE
SARS Coronavirus 2 by RT PCR: NEGATIVE

## 2021-04-30 LAB — TROPONIN I (HIGH SENSITIVITY)
Troponin I (High Sensitivity): 33 ng/L — ABNORMAL HIGH (ref <18)
Troponin I (High Sensitivity): 32 ng/L — ABNORMAL HIGH (ref <18)

## 2021-04-30 IMAGING — CT CT HEAD W/O CM
4 of 7 series · 12 of 47 positions shown, 13 images · non-contrast
Comparison: MR head [DATE]

CLINICAL DATA: Neuro deficit.

EXAM:
CT HEAD WITHOUT CONTRAST
TECHNIQUE: Contiguous axial images were obtained from the base of the skull
through the vertex without intravenous contrast.

[Series 2: head bone · axial · 0.44mm/px · z∈[+293,+329]mm · 3 of 77 slices shown]
[im 6/77  bone]
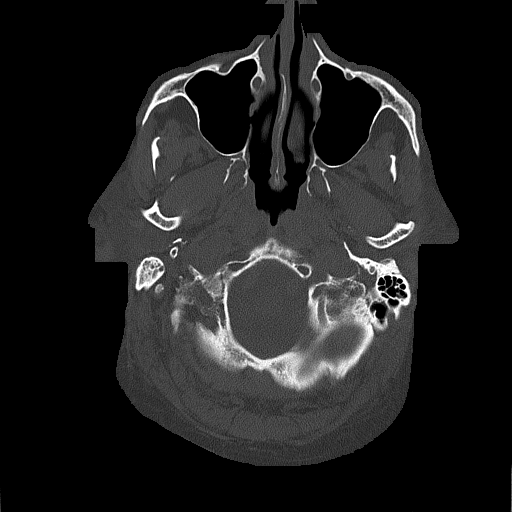
[im 18/77  bone]
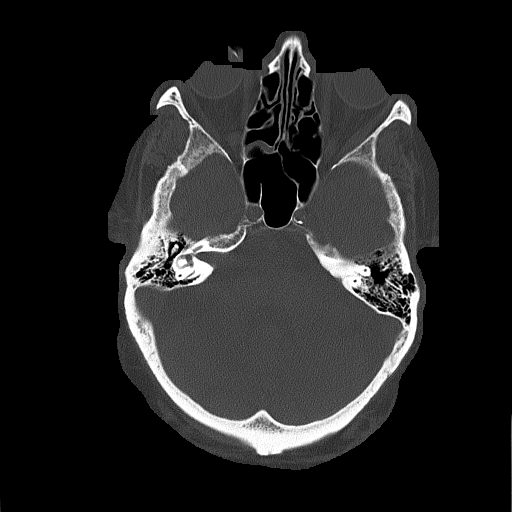
[im 24/77  bone]
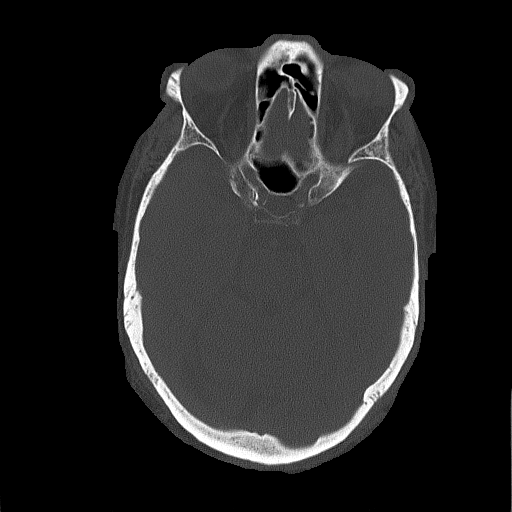

[Series 3: head wo · axial · 0.44mm/px · z∈[+313,+403]mm · 4 of 31 slices shown, 5 images]
[im 7/31  brain]
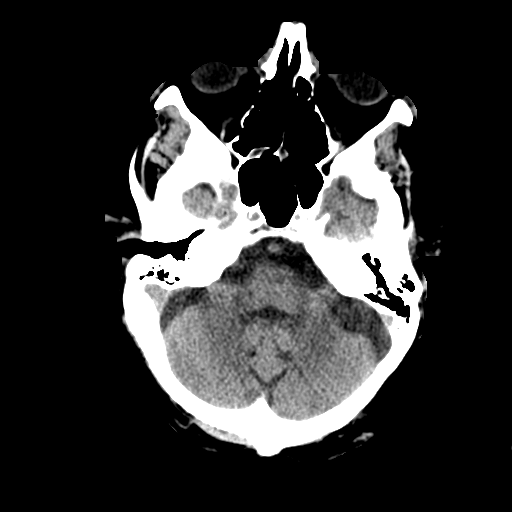
[im 7/31  bone]
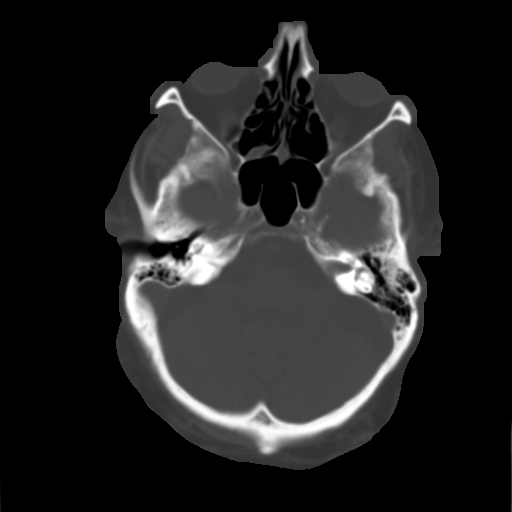
[im 13/31  brain]
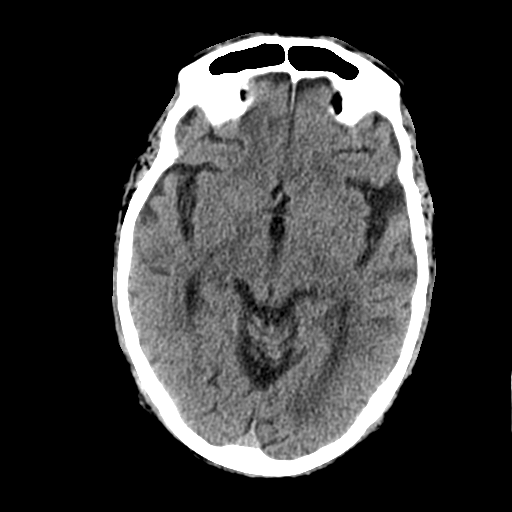
[im 19/31  brain]
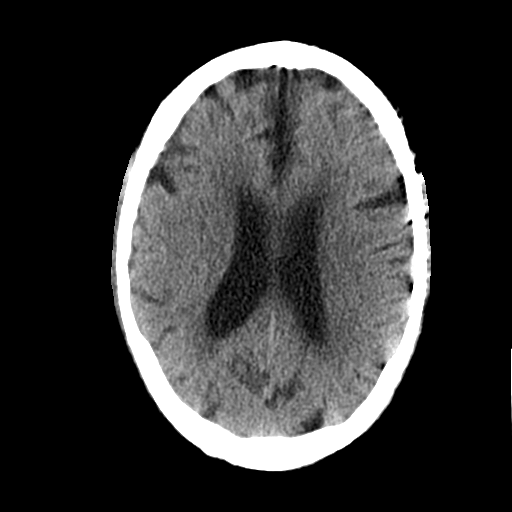
[im 25/31  brain]
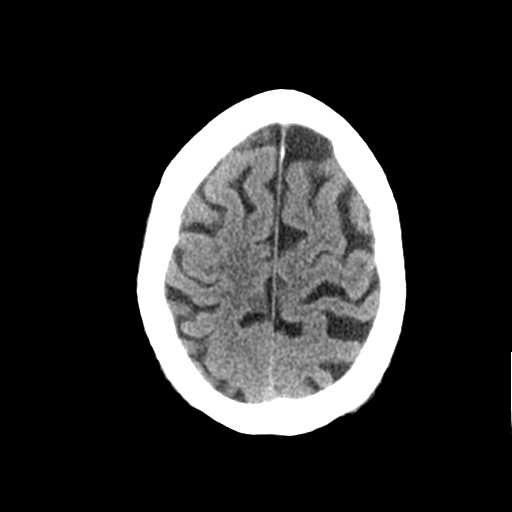

[Series 8: coronal soft tissue · coronal · 0.16mm/px · 3 of 69 slices shown]
[im 23/69  brain]
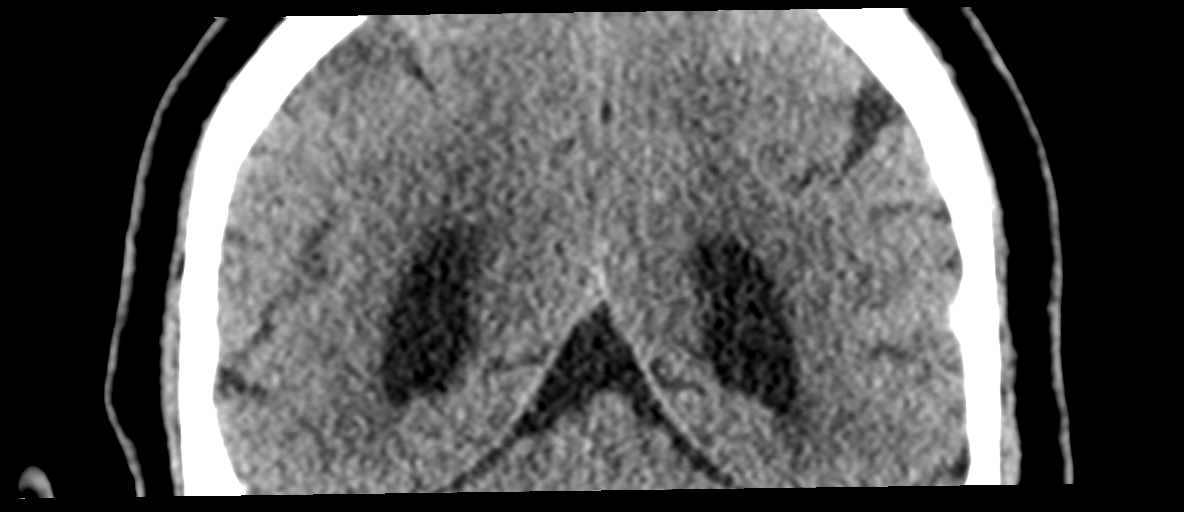
[im 31/69  brain]
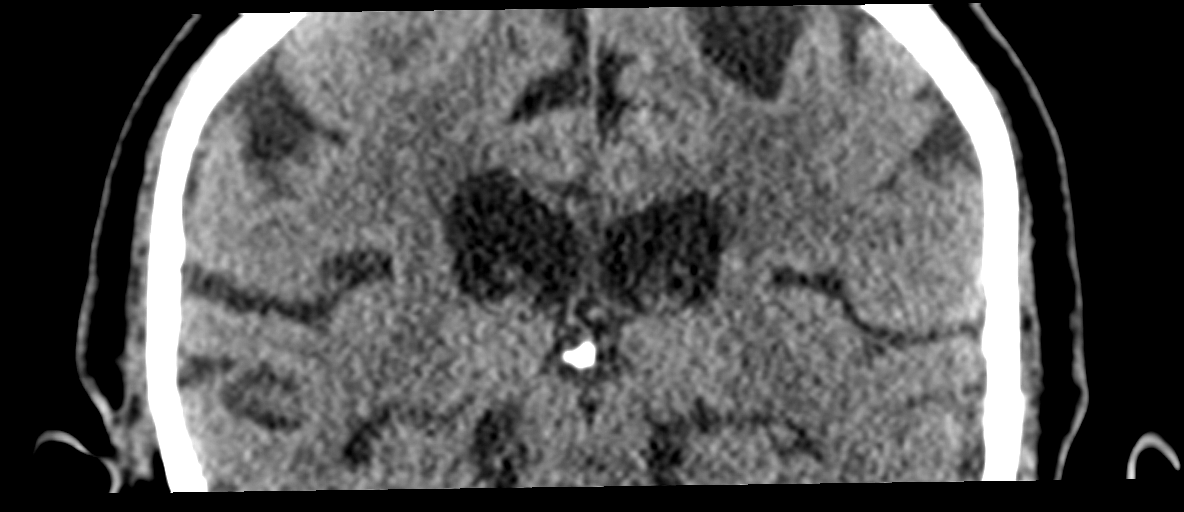
[im 38/69  brain]
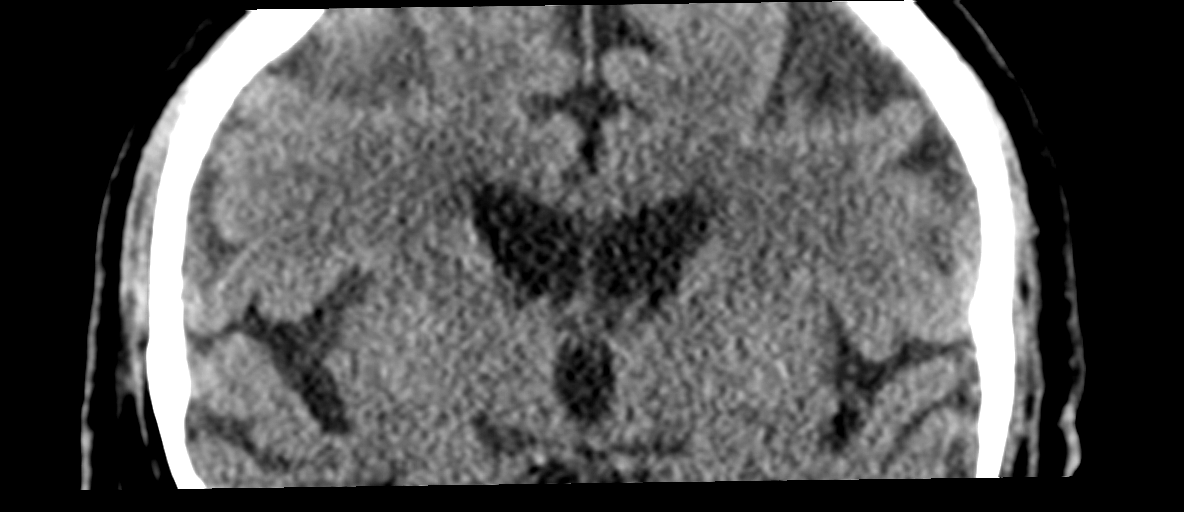

[Series 9: sagittal soft tissue · sagittal · 0.16mm/px · 2 of 62 slices shown]
[im 21/62  brain]
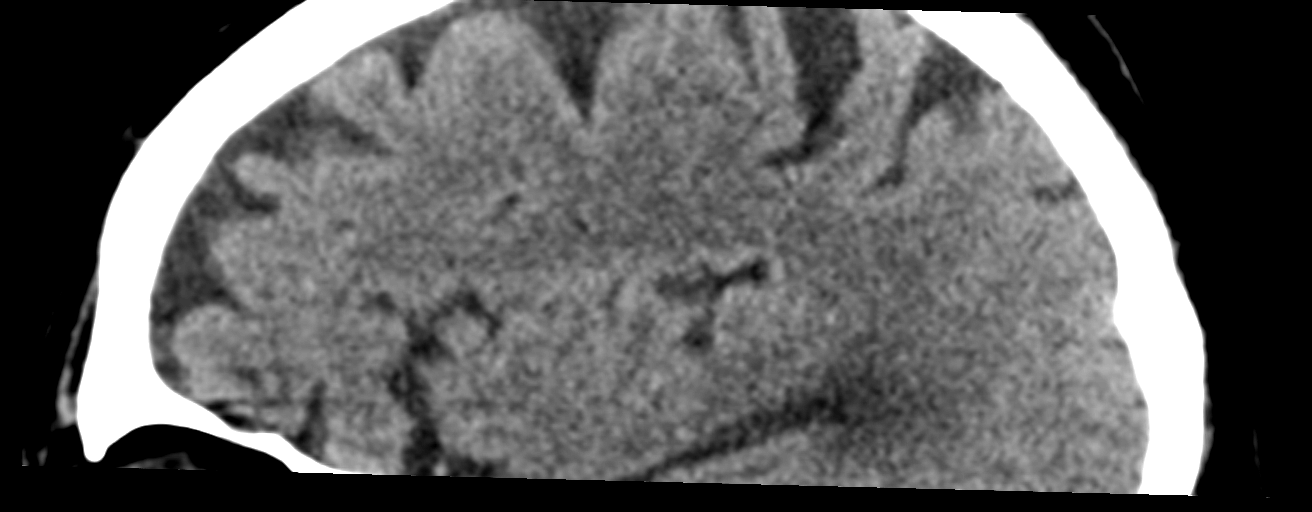
[im 41/62  brain]
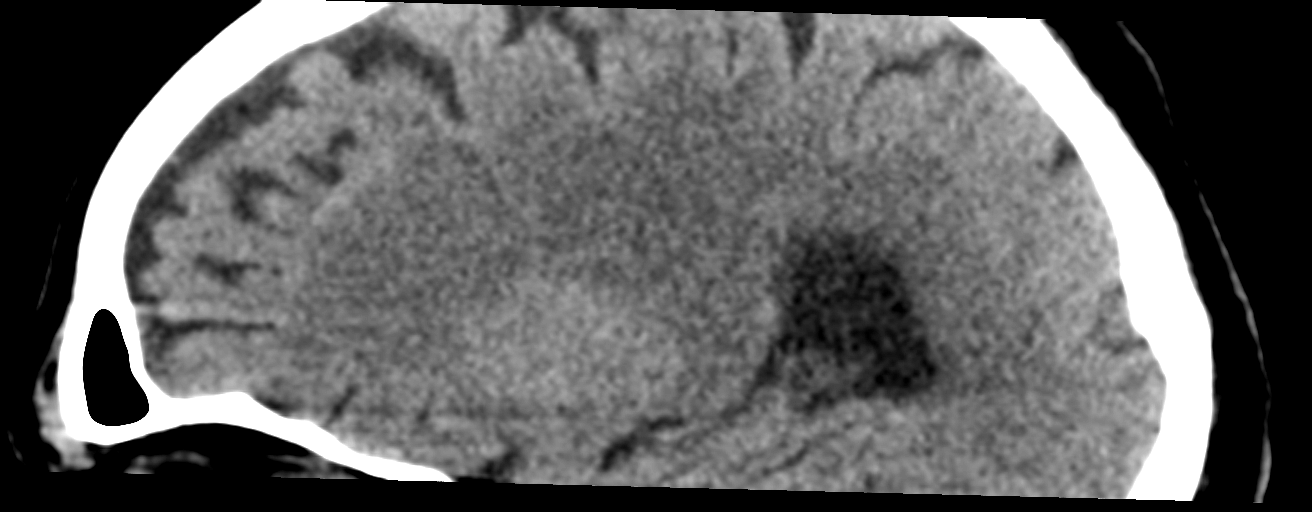

[12 of 47 positions shown; findings below may reference images not displayed]

BRAIN:
BRAIN
Patchy and confluent areas of decreased attenuation are noted
throughout the deep and periventricular white matter of the cerebral
hemispheres bilaterally, compatible with chronic microvascular
ischemic disease.

No evidence of large-territorial acute infarction. No parenchymal
hemorrhage. No mass lesion. No extra-axial collection.

No mass effect or midline shift. No hydrocephalus. Basilar cisterns
are patent.

Vascular: No hyperdense vessel. Atherosclerotic calcifications are
present within the cavernous internal carotid arteries.

Skull: No acute fracture or focal lesion.

Sinuses/Orbits: Paranasal sinuses and mastoid air cells are clear.
Bilateral lens replacement. The orbits are unremarkable.

Other: None.
IMPRESSION: No acute intracranial abnormality.

## 2021-04-30 IMAGING — MR MR HEAD W/O CM
12 series · 46 of 48 positions shown · non-contrast
Comparison: None.

CLINICAL DATA: Neuro deficit, acute, stroke suspected

EXAM:
MRI HEAD WITHOUT CONTRAST
TECHNIQUE: Multiplanar, multiecho pulse sequences of the brain and surrounding
structures were obtained without intravenous contrast.

[Series 5: ax dwi_tracew · axial · 3.0mm · 0.65mm/px · z∈[-85,+70]mm · 3 of 48 slices shown]
[im 1/48]
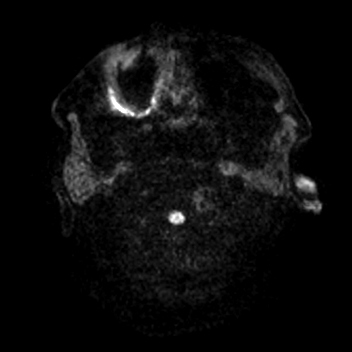
[im 24/48]
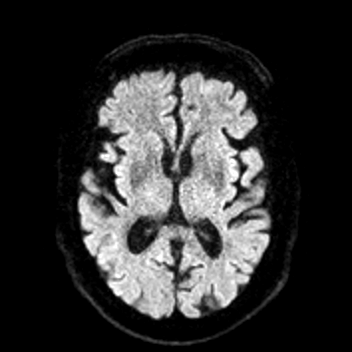
[im 48/48]
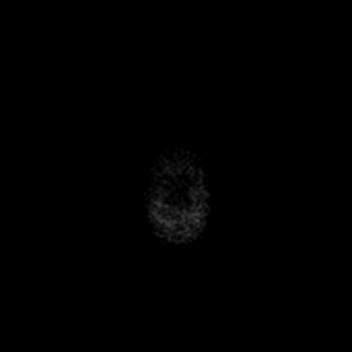

[Series 6: ax dwi_adc · axial · 3.0mm · 0.65mm/px · z∈[-85,+70]mm · 4 of 48 slices shown]
[im 1/48]
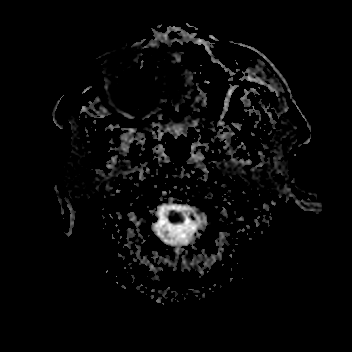
[im 16/48]
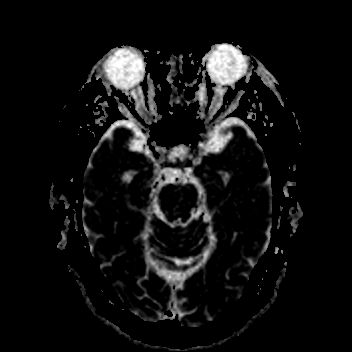
[im 32/48]
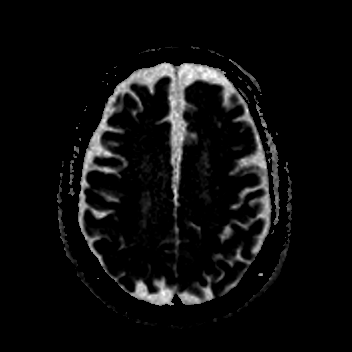
[im 48/48]
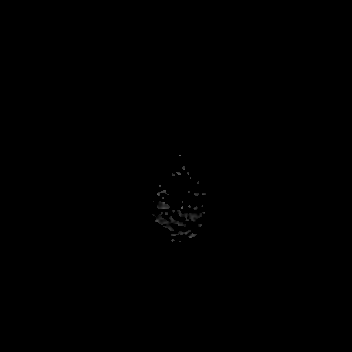

[Series 7: cor dwi_tracew · coronal · 5.0mm · 0.65mm/px · 3 of 40 slices shown]
[im 1/40]
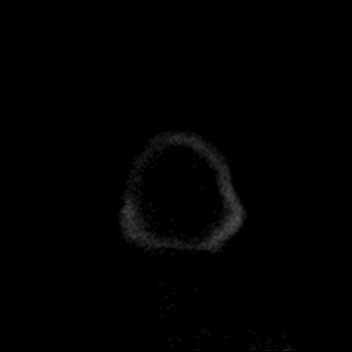
[im 20/40]
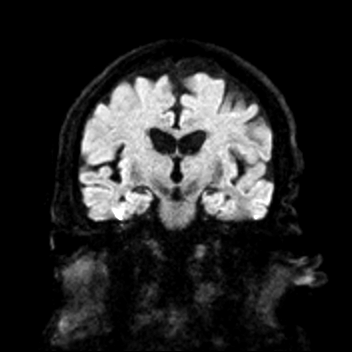
[im 40/40]
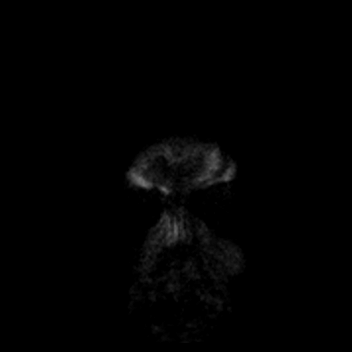

[Series 8: cor dwi_adc · coronal · 5.0mm · 0.65mm/px · 3 of 40 slices shown]
[im 1/40]
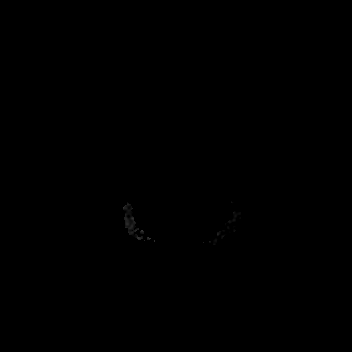
[im 20/40]
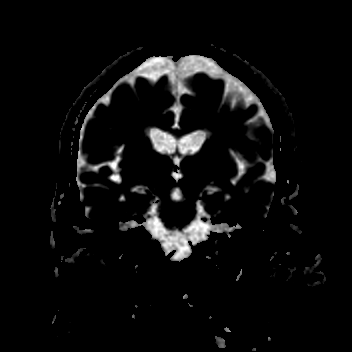
[im 40/40]
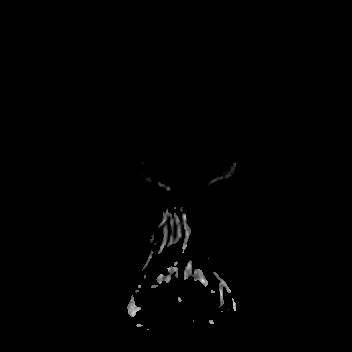

[Series 9: T1 · sagittal · 5.0mm · 0.62mm/px · 2 of 23 slices shown (1 of 2)]
[im 1/23]
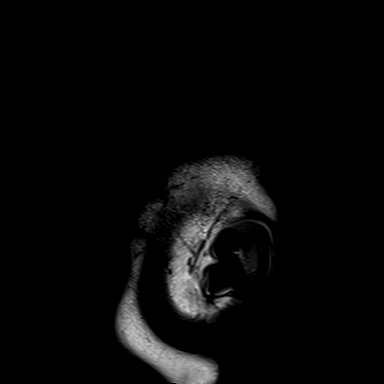
[im 23/23]
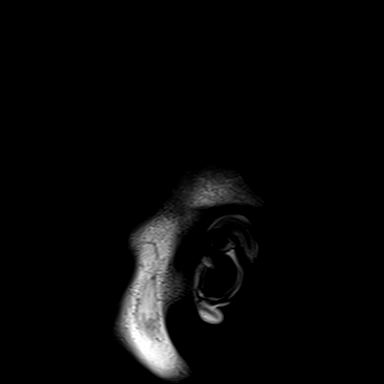

[Series 10: T2 · axial · 5.0mm · 0.53mm/px · z∈[-80,+64]mm · 2 of 25 slices shown (1 of 2)]
[im 1/25]
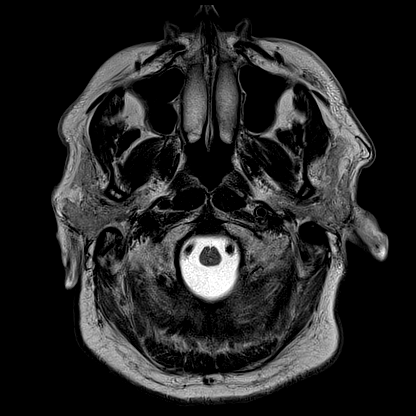
[im 25/25]
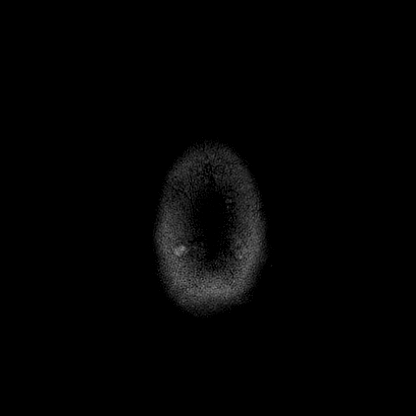

[Series 12: pha_images · axial · 3.0mm · 0.90mm/px · z∈[-96,+81]mm · 4 of 58 slices shown]
[im 1/58]
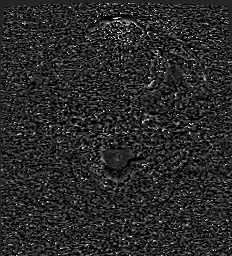
[im 20/58]
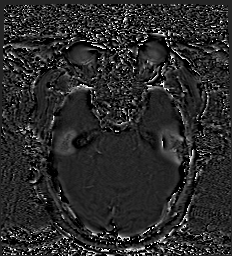
[im 39/58]
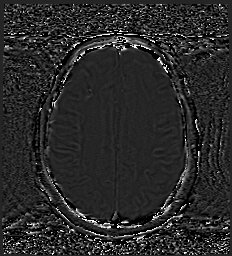
[im 58/58]
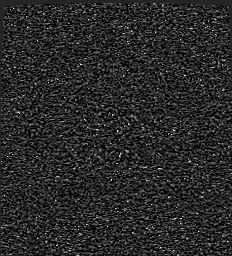

[Series 13: swi_images · axial · 3.0mm · 0.90mm/px · z∈[-96,+81]mm · 4 of 60 slices shown]
[im 1/60]
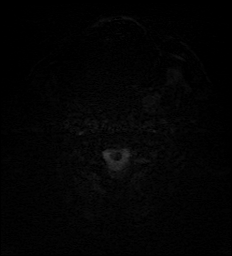
[im 20/60]
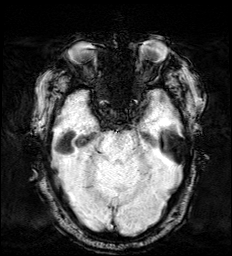
[im 40/60]
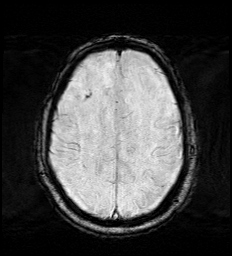
[im 60/60]
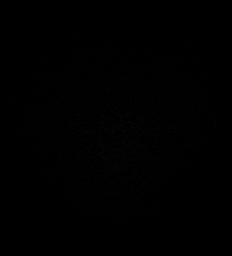

[Series 14: mip_images(sw) · axial · 24.0mm · 0.90mm/px · z∈[-86,+70]mm · 4 of 53 slices shown]
[im 1/53]
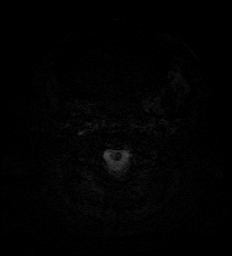
[im 18/53]
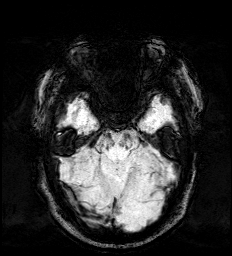
[im 35/53]
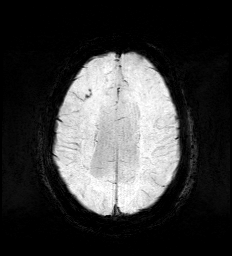
[im 53/53]
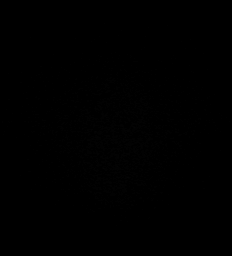

[Series 15: FLAIR · axial · 3.0mm · 0.53mm/px · z∈[-89,+73]mm · 4 of 55 slices shown]
[im 1/55]
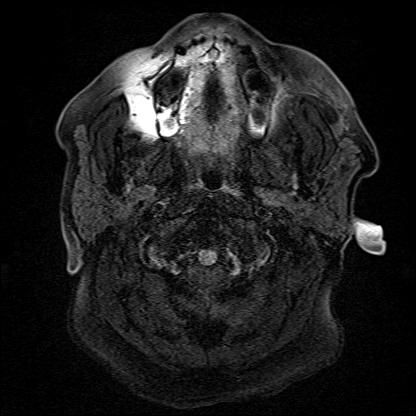
[im 19/55]
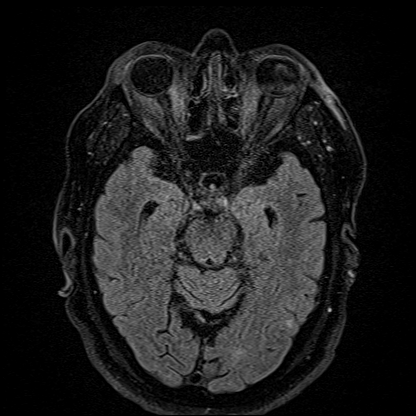
[im 37/55]
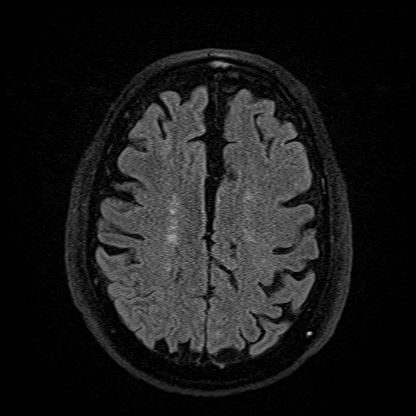
[im 55/55]
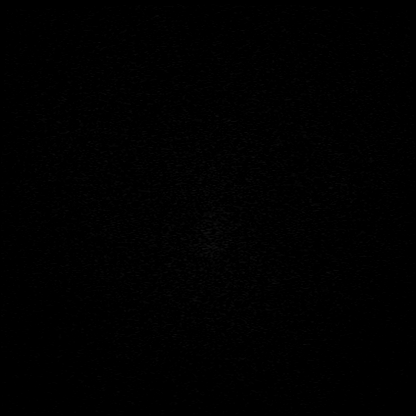

[Series 16: T1 · axial · 1.0mm · 0.98mm/px · z∈[-95,+80]mm · 11 of 176 slices shown (2 of 2)]
[im 1/176]
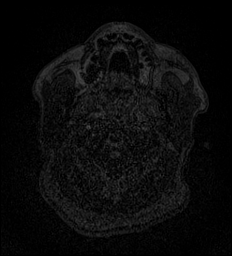
[im 15/176]
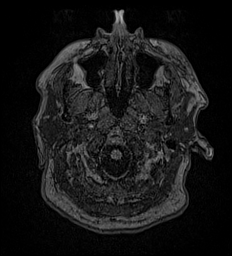
[im 30/176]
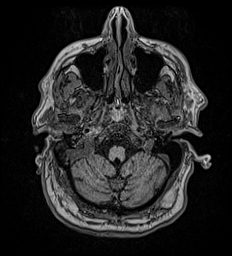
[im 44/176]
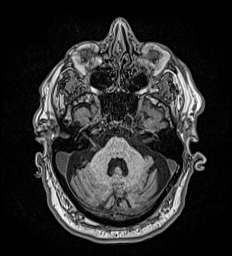
[im 59/176]
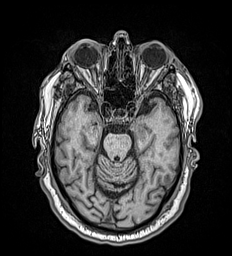
[im 73/176]
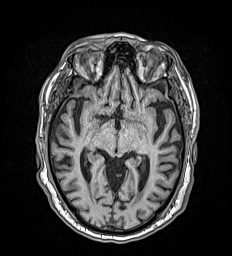
[im 88/176]
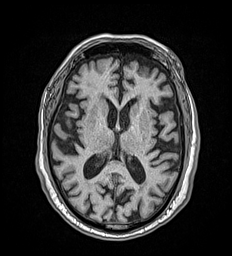
[im 103/176]
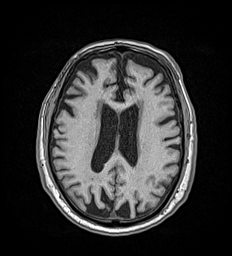
[im 117/176]
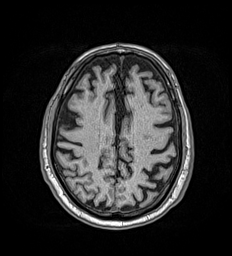
[im 146/176]
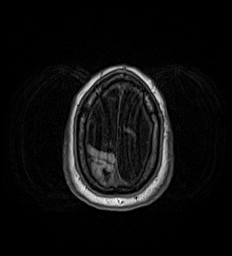
[im 176/176]
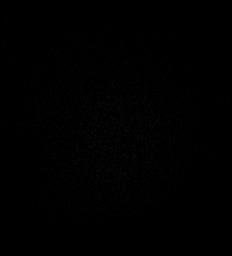

[Series 17: T2 · coronal · 5.0mm · 0.45mm/px · 2 of 31 slices shown (2 of 2)]
[im 1/31]
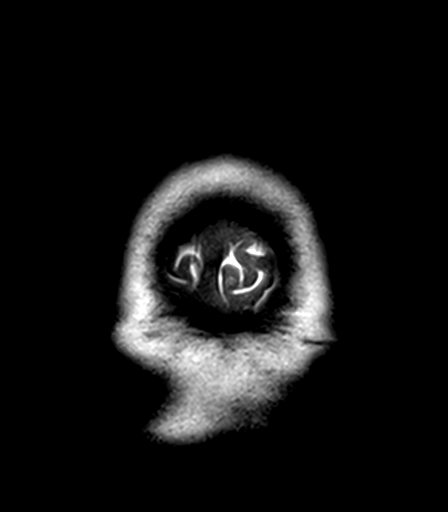
[im 31/31]
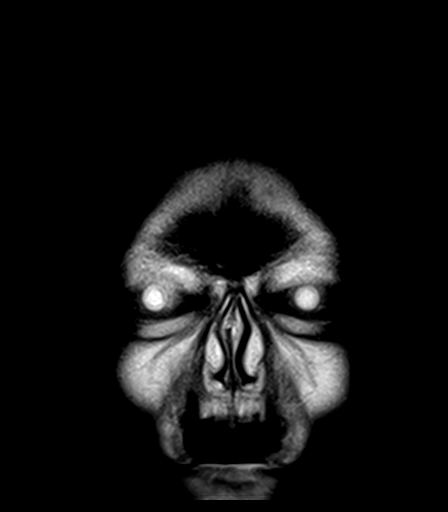

[46 of 48 positions shown; findings below may reference images not displayed]

FINDINGS: Brain: There is curvilinear susceptibility in the right frontal lobe
contiguous with a cortical vein (series 13, images 39-41). Subtle
corresponding T2 FLAIR hyperintensity.

There is no acute infarction. There is no intracranial mass or mass
effect. There is no hydrocephalus or extra-axial fluid collection.

Prominence of the ventricles and sulci reflects generalized
parenchymal loss. Patchy T2 hyperintensity in the supratentorial
white matter is nonspecific but may reflect underlying microvascular
ischemic changes.

Vascular: Major vessel flow voids at the skull base are preserved.

Skull and upper cervical spine: Normal marrow signal is preserved.

Sinuses/Orbits: Mild mucosal thickening. Bilateral lens
replacements.

Other: Sella is unremarkable. Patchy right mastoid fluid
opacification.
IMPRESSION: Curvilinear possible abnormal signal in the right frontal lobe
contiguous with a cortical vein. Considerations include minimal
subarachnoid hemorrhage and cortical vein thrombosis. Head CT may be
helpful.

## 2021-04-30 MED ORDER — LISINOPRIL 10 MG PO TABS
20.0000 mg | ORAL_TABLET | Freq: Once | ORAL | Status: AC
  Administered 2021-04-30: 20 mg via ORAL
  Filled 2021-04-30: qty 2

## 2021-04-30 MED ORDER — PRAVASTATIN SODIUM 20 MG PO TABS
20.0000 mg | ORAL_TABLET | Freq: Every evening | ORAL | Status: DC
  Administered 2021-04-30 – 2021-05-10 (×10): 20 mg via ORAL
  Filled 2021-04-30 (×10): qty 1

## 2021-04-30 MED ORDER — DOXAZOSIN MESYLATE 1 MG PO TABS
1.0000 mg | ORAL_TABLET | Freq: Once | ORAL | Status: AC
  Administered 2021-04-30: 1 mg via ORAL
  Filled 2021-04-30: qty 1

## 2021-04-30 MED ORDER — ASPIRIN EC 81 MG PO TBEC
81.0000 mg | DELAYED_RELEASE_TABLET | Freq: Every day | ORAL | Status: DC
  Administered 2021-04-30 – 2021-05-11 (×11): 81 mg via ORAL
  Filled 2021-04-30 (×12): qty 1

## 2021-04-30 MED ORDER — ALUM & MAG HYDROXIDE-SIMETH 200-200-20 MG/5ML PO SUSP
15.0000 mL | Freq: Once | ORAL | Status: AC
  Administered 2021-04-30: 15 mL via ORAL
  Filled 2021-04-30: qty 30

## 2021-04-30 MED ORDER — HYDROCHLOROTHIAZIDE 25 MG PO TABS
25.0000 mg | ORAL_TABLET | Freq: Once | ORAL | Status: AC
  Administered 2021-04-30: 25 mg via ORAL
  Filled 2021-04-30: qty 1

## 2021-04-30 MED ORDER — LORAZEPAM 2 MG/ML IJ SOLN
1.0000 mg | Freq: Once | INTRAMUSCULAR | Status: AC
  Administered 2021-04-30: 1 mg via INTRAVENOUS
  Filled 2021-04-30: qty 1

## 2021-04-30 MED ORDER — FAMOTIDINE 20 MG PO TABS: 20.0000 mg | ORAL_TABLET | Freq: Every day | ORAL | Status: DC

## 2021-04-30 MED ORDER — ACETAMINOPHEN 500 MG PO TABS
1000.0000 mg | ORAL_TABLET | Freq: Once | ORAL | Status: AC
  Administered 2021-04-30: 1000 mg via ORAL
  Filled 2021-04-30: qty 2

## 2021-04-30 MED ORDER — FAMOTIDINE 20 MG PO TABS
20.0000 mg | ORAL_TABLET | Freq: Once | ORAL | Status: AC
  Administered 2021-04-30: 20 mg via ORAL
  Filled 2021-04-30: qty 1

## 2021-04-30 MED ORDER — LISINOPRIL 10 MG PO TABS: 10.0000 mg | ORAL_TABLET | Freq: Two times a day (BID) | ORAL | Status: DC

## 2021-04-30 MED ORDER — INSULIN ASPART 100 UNIT/ML IJ SOLN
0.0000 [IU] | Freq: Every day | INTRAMUSCULAR | Status: DC
Start: 2021-04-30 — End: 2021-05-11
  Administered 2021-04-30: 3 [IU] via SUBCUTANEOUS
  Administered 2021-05-01 – 2021-05-10 (×6): 2 [IU] via SUBCUTANEOUS
  Filled 2021-04-30 (×7): qty 1

## 2021-04-30 MED ORDER — LABETALOL HCL 5 MG/ML IV SOLN
20.0000 mg | Freq: Once | INTRAVENOUS | Status: AC
  Administered 2021-04-30: 20 mg via INTRAVENOUS
  Filled 2021-04-30: qty 4

## 2021-04-30 MED ORDER — LISINOPRIL 10 MG PO TABS: 40.0000 mg | ORAL_TABLET | Freq: Every day | ORAL | Status: DC

## 2021-04-30 MED ORDER — INSULIN ASPART 100 UNIT/ML IJ SOLN
0.0000 [IU] | Freq: Three times a day (TID) | INTRAMUSCULAR | Status: DC
Start: 2021-05-01 — End: 2021-05-11
  Administered 2021-05-01 (×2): 5 [IU] via SUBCUTANEOUS
  Administered 2021-05-01: 11 [IU] via SUBCUTANEOUS
  Administered 2021-05-02 (×2): 5 [IU] via SUBCUTANEOUS
  Administered 2021-05-03 (×3): 8 [IU] via SUBCUTANEOUS
  Administered 2021-05-04: 5 [IU] via SUBCUTANEOUS
  Administered 2021-05-04: 3 [IU] via SUBCUTANEOUS
  Administered 2021-05-04: 8 [IU] via SUBCUTANEOUS
  Administered 2021-05-05 (×2): 3 [IU] via SUBCUTANEOUS
  Administered 2021-05-05: 5 [IU] via SUBCUTANEOUS
  Administered 2021-05-06 (×2): 3 [IU] via SUBCUTANEOUS
  Administered 2021-05-07: 8 [IU] via SUBCUTANEOUS
  Administered 2021-05-07 – 2021-05-08 (×2): 3 [IU] via SUBCUTANEOUS
  Administered 2021-05-08: 5 [IU] via SUBCUTANEOUS
  Administered 2021-05-09: 3 [IU] via SUBCUTANEOUS
  Administered 2021-05-09: 11 [IU] via SUBCUTANEOUS
  Administered 2021-05-09: 3 [IU] via SUBCUTANEOUS
  Administered 2021-05-10: 5 [IU] via SUBCUTANEOUS
  Administered 2021-05-10: 8 [IU] via SUBCUTANEOUS
  Administered 2021-05-10: 5 [IU] via SUBCUTANEOUS
  Administered 2021-05-11: 11 [IU] via SUBCUTANEOUS
  Administered 2021-05-11: 5 [IU] via SUBCUTANEOUS
  Filled 2021-04-30 (×26): qty 1

## 2021-04-30 MED ORDER — DOXAZOSIN MESYLATE 1 MG PO TABS
1.0000 mg | ORAL_TABLET | Freq: Every day | ORAL | Status: DC
  Filled 2021-04-30: qty 1

## 2021-04-30 MED ORDER — HYDRALAZINE HCL 20 MG/ML IJ SOLN
10.0000 mg | Freq: Once | INTRAMUSCULAR | Status: AC
  Administered 2021-04-30: 10 mg via INTRAVENOUS
  Filled 2021-04-30: qty 1

## 2021-04-30 MED ORDER — ALUM & MAG HYDROXIDE-SIMETH 200-200-20 MG/5ML PO SUSP
30.0000 mL | Freq: Once | ORAL | Status: AC
  Administered 2021-04-30: 30 mL via ORAL
  Filled 2021-04-30: qty 30

## 2021-04-30 NOTE — ED Provider Notes (Signed)
[facility] Emergency Department Provider Note  ____________________________________________  Time seen: Approximately 2:30 AM  I have reviewed the triage vital signs and the nursing notes.   HISTORY  Chief Complaint Weakness   HPI Alex Smith is a 86 y.o. male with a history of anemia, BPH, CKD, GERD, hypertension, diabetes who presents for evaluation of generalized weakness and fall.  According to the patient he has been feeling progressively weaker over the last week.  According to the daughter patient has been having some memory deficits and he has not had any weakness until today.  He went to [place] this weekend for a funeral.  After leaving the funeral this morning patient tripped and had a mechanical fall.  He spent most of the day at the ER in [place] where he had face trauma, head trauma, left-sided chest trauma.  Imaging studies done at the outside hospital did not show any acute traumatic injuries and patient was discharged.  He was then driven back to [place] where he lives.  This evening, his daughter was with him and noticed that he was extremely weak.  She was trying to help him get to the bathroom when he had another fall.  She reports that she is unable to hold or assist him at home.  Patient reports that he feels sore all over.  Daughter has not noticed any localized deficits.  He has no history of a CVA.  On the fall this evening he fell onto his buttock.  Did not hit his head.  He is complaining of pain all over since this morning.  Has had no fever, no vomiting.  He denies abdominal pain and reports that his pain is on his left chest wall from the fall.  Per daughter, patient lives independently until the fall earlier this morning and seem to be at his baseline other than some memory deficits which have been getting progressively worse over the last several months.  According to the daughter, patient told her today that he stopped taking  his antihypertensive medication about a month ago.   Past Medical History:  Diagnosis Date   Anemia    B12 deficiency    Basal cell carcinoma 03/30/2018   left ant nasal ala   Basal cell carcinoma of face 01/19/2013   right distal dorsum nose   BPH (benign prostatic hypertrophy)    followed by urology, discharged (Dr. 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 pretibial. 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 EDC 09/07/2020   Squamous cell carcinoma of skin 04/03/2020   Left cheek. WD SCC   Systolic murmur 06/21/1593   Type 2 diabetes, controlled, with retinopathy 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   Myelodysplasia present in bone marrow ([place]) 11/03/2015   Advanced care planning/counseling discussion 05/22/2015   Insomnia 03/03/2015   Monoclonal gammopathy 02/03/2015   Macrocytic anemia  02/03/2015   Severe obesity (BMI 35.0-39.9) with comorbidity ([place]) 10/12/2014   History of nonmelanoma skin cancer 10/10/2014   Medicare annual wellness visit, subsequent 04/12/2014   Health maintenance examination 04/12/2014   Hairy cell leukemia, in remission ([place])    B12 deficiency    BPH with obstruction/lower urinary tract symptoms    Essential hypertension    GERD (gastroesophageal reflux disease)    Dyslipidemia    Type 2 diabetes, controlled, with retinopathy ([place])    Diverticulosis    Macular hole 07/07/2012    Past Surgical History:  Procedure Laterality Date   25 GAUGE PARS PLANA VITRECTOMY WITH 20 GAUGE MVR PORT FOR MACULAR HOLE  07/07/2012   Procedure: 25 GAUGE PARS PLANA VITRECTOMY WITH 20 GAUGE MVR PORT FOR MACULAR HOLE;  Surgeon: Hayden Pedro, MD;  Location: [facility];  Service: Ophthalmology;  Laterality: Left;   BONE MARROW BIOPSY  2016   CARDIOVASCULAR STRESS TEST  2013   treadmill - no evidence ischemia, EF 61%   CATARACT EXTRACTION W/ INTRAOCULAR LENS  IMPLANT, BILATERAL  ~ 2010   COLONOSCOPY  2014   [place] WNL no rpt needed, h/o polyps   EYE SURGERY Left 06/2012   laser surgery   GAS INSERTION  07/07/2012   Procedure: INSERTION OF GAS;  Surgeon: Hayden Pedro, MD;  Location: [facility];  Service: Ophthalmology;  Laterality: Left;  [id number]   PERIPHERAL VASCULAR CATHETERIZATION N/A 02/23/2015   Procedure: Glori Luis Cath Insertion;  Surgeon: Algernon Huxley, MD;  Location: [place] [facility];  Service: Cardiovascular;  Laterality: N/A;   PORTA CATH REMOVAL N/A 02/16/2020   Procedure: PORTA CATH REMOVAL;  Surgeon: Algernon Huxley, MD;  Location: [place] [facility];  Service: Cardiovascular;  Laterality: N/A;   SERUM PATCH  07/07/2012   Procedure: SERUM PATCH;  Surgeon: Hayden Pedro, MD;  Location: [facility];  Service: Ophthalmology;  Laterality: Left;   SKIN CANCER EXCISION     "all over my face" (07/07/2012)    Prior to Admission medications   Medication Sig Start Date End  Date Taking? Authorizing Provider  aspirin EC 81 MG tablet Take 81 mg by mouth daily.    [provider]  DIPHENHYDRAMINE-ACETAMINOPHEN PO Take 1 tablet by mouth at bedtime.    [provider]  doxazosin (CARDURA) 1 MG tablet TAKE ONE TABLET BY MOUTH EVERY DAY 08/31/19   Ria Bush, MD  famotidine (PEPCID) 40 MG tablet Take 0.5 tablets (20 mg total) by mouth daily. NEEDS OFFICE VISIT 07/16/20   Ria Bush, MD  glucose blood (ACCU-CHEK AVIVA PLUS) test strip USE TO CHECK SUGAR DAILY E11.319 08/28/18   Ria Bush, MD  HUMULIN N 100 UNIT/ML injection INJECT 39 UNITS SUBCUTANEOUSLY TWICE DAILY BEFORE A MEAL Patient taking differently: Inject 1 Units into the skin as directed. Pt uses this as sliding scale 06/23/20   Ria Bush, MD  HUMULIN R 100 UNIT/ML injection  INJECT 10 UNITS INTO THE SKIN TWICE DAILY WITH MEALS 11/09/19   Ria Bush, MD  hydrochlorothiazide (HYDRODIURIL) 25 MG tablet TAKE 1 TABLET BY MOUTH DAILY 11/09/19   Ria Bush, MD  lisinopril (ZESTRIL) 20 MG tablet TAKE ONE TABLET BY MOUTH TWICE DAILY 03/01/20   Ria Bush, MD  Microlet Lancets MISC USE AS DIRECTED 10/28/19   Ria Bush, MD  omeprazole (PRILOSEC) 20 MG capsule Take 1 capsule by mouth 1 day or 1 dose.    [provider]  pravastatin (PRAVACHOL) 20 MG tablet TAKE ONE TABLET EVERY EVENING 03/16/20   Ria Bush, MD  tamsulosin (FLOMAX) 0.4 MG CAPS capsule TAKE 1 CAPSULE BY MOUTH EVERY DAY Patient not taking: Reported on 10/27/2020 12/11/17   Ria Bush, MD  temazepam (RESTORIL) 7.5 MG capsule Take 1 capsule by mouth daily. 03/03/15   [provider]  traZODone (DESYREL) 50 MG tablet TAKE 1/2 TO 1 TABLET BY MOUTH AT [facility] NEEDED FOR SLEEP 01/23/17   Ria Bush, MD  Flossie Buffy INSULIN SYRINGE 31G X 5/16" 0.5 ML MISC See admin instructions. 01/24/20   [provider]  vitamin B-12 (CYANOCOBALAMIN) 1000 MCG tablet Take 1  tablet (1,000 mcg total) by mouth daily. 06/01/19   Ria Bush, MD  vitamin E 400 UNIT capsule Take 200 Units by mouth daily.     [provider]    Allergies Rituximab, Blood-group specific substance, Primaxin [imipenem], Voriconazole, Sulfa antibiotics, and Sulfacetamide sodium  Family History  Problem Relation Age of Onset   Dementia Mother    Heart failure Father 59   Cancer Sister        breast   Diabetes Paternal Uncle    Diabetes Paternal Aunt    CAD Brother 59       MI   Stroke Neg Hx     Social History Social History   Tobacco Use   Smoking status: Former    Packs/day: 2.00    Years: 25.00    Pack years: 50.00    Types: Cigarettes    Quit date: 02/06/1969    Years since quitting: 52.2   Smokeless tobacco: Never   Tobacco comments:    07/07/2012 "stopped smoking ~ 40 yr ago; smoked 20-19yr  Vaping Use   Vaping Use: Never used  Substance Use Topics   Alcohol use: Yes    Alcohol/week: 0.0 standard drinks    Comment: rare   Drug use: No    Review of Systems  Constitutional: Negative for fever. Eyes: Negative for visual changes. ENT: Negative for facial injury or neck injury Cardiovascular: + L chest wall pain. Respiratory: Negative for shortness of breath. Negative for chest wall injury. Gastrointestinal: Negative for abdominal pain or injury. Genitourinary: Negative for dysuria. Musculoskeletal: Negative for back injury, negative for arm or leg pain. Skin: Negative for laceration/abrasions. Neurological: Negative for head injury.   ____________________________________________   PHYSICAL EXAM:  VITAL SIGNS: ED Triage Vitals  Enc Vitals Group     BP 04/29/21 2315 (!) 162/117     Pulse Rate 04/29/21 2315 92     Resp 04/29/21 2315 14     Temp 04/29/21 2315 98.5 F (36.9 C)     Temp Source 04/29/21 2315 Oral     SpO2 04/29/21 2315 97 %     Weight 04/29/21 2316 250 lb (113.4 kg)     Height 04/29/21 2316 5' 7" (1.702 m)     Head  Circumference --      Peak Flow --  Pain Score --      Pain Loc --      Pain Edu? --      Excl. in [place]? --     Full spinal precautions maintained throughout the trauma exam. Constitutional: Alert and oriented. No acute distress. Does not appear intoxicated. HEENT Head: Normocephalic and atraumatic. Face: No facial bony tenderness. Stable midface. Bruising to the L face and forehead. Ears: No hemotympanum bilaterally. No Battle sign Eyes: No eye injury. PERRL. No raccoon eyes Nose: Nontender. No epistaxis. No rhinorrhea Mouth/Throat: Mucous membranes are moist. No oropharyngeal blood. No dental injury. Airway patent without stridor. Normal voice. Neck: no C-collar. No midline c-spine tenderness.  Cardiovascular: Normal rate, regular rhythm. Normal and symmetric distal pulses are present in all extremities. Pulmonary/Chest: Chest wall is stable and nontender to palpation/compression. Normal respiratory effort. Breath sounds are normal. No crepitus.  Abdominal: Soft, nontender, non distended. Musculoskeletal: Right knee abrasion, patient has finger splint on the right hand from a dislocation from the fall earlier today.  Nontender with normal full range of motion in all other extremities. No deformities. No thoracic or lumbar midline spinal tenderness. Pelvis is stable. Skin: Skin is warm, dry and intact. No abrasions or contutions. Psychiatric: Speech and behavior are appropriate. Neurological: Normal speech and language. Moves all extremities to command. No gross focal neurologic deficits are appreciated.  Glascow Coma Score: 4 - Opens eyes on own 6 - Follows simple motor commands 5 - Alert and oriented GCS: 15   ____________________________________________   LABS (all labs ordered are listed, but only abnormal results are displayed)  Labs Reviewed  CBC - Abnormal; Notable for the following components:      Result Value   RBC 3.03 (*)    Hemoglobin 11.0 (*)    HCT 31.9  (*)    MCV 105.3 (*)    MCH 36.3 (*)    Platelets 113 (*)    nRBC 0.4 (*)    All other components within normal limits  URINALYSIS, COMPLETE (UACMP) WITH MICROSCOPIC - Abnormal; Notable for the following components:   APPearance CLEAR (*)    Specific Gravity, Urine >1.030 (*)    Glucose, UA 100 (*)    Hgb urine dipstick LARGE (*)    Ketones, ur TRACE (*)    Protein, ur >300 (*)    All other components within normal limits  COMPREHENSIVE METABOLIC PANEL - Abnormal; Notable for the following components:   Glucose, Bld 228 (*)    BUN 24 (*)    Creatinine, Ser 1.48 (*)    Total Bilirubin 1.6 (*)    GFR, Estimated 46 (*)    All other components within normal limits  TROPONIN I (HIGH SENSITIVITY) - Abnormal; Notable for the following components:   Troponin I (High Sensitivity) 33 (*)    All other components within normal limits  TROPONIN I (HIGH SENSITIVITY) - Abnormal; Notable for the following components:   Troponin I (High Sensitivity) 32 (*)    All other components within normal limits  RESP PANEL BY RT-PCR (FLU A&B, COVID) [id number]   ____________________________________________  EKG  ED ECG REPORT I, Rudene Re, the attending physician, personally viewed and interpreted this ECG.  Sinus rhythm with rate of 96, normal intervals, normal axis, no ST elevations or depressions ____________________________________________  RADIOLOGY  None done here   ____________________________________________   PROCEDURES  Procedure(s) performed:yes .1-3 Lead EKG Interpretation Performed by: Rudene Re, MD Authorized by: Rudene Re, MD     Interpretation: non-specific  ECG rate assessment: normal     Rhythm: sinus rhythm     Ectopy: none     Conduction: normal     Critical Care performed:  None ____________________________________________   INITIAL IMPRESSION / ASSESSMENT AND PLAN / ED COURSE  86 y.o. male with a history of anemia, BPH, CKD, GERD,  hypertension, diabetes who presents for evaluation of generalized weakness and fall.  According to the daughter, patient has had progressively worsening weakness since a fall earlier this morning for which she was seen at an outside hospital in [place] while visiting for a funeral.  Patient is complaining of diffuse soreness but also reports that he has been feeling weak over the last couple of days.  Patient usually lives alone but today has been unable to ambulate safely and sustained another fall.  Fell on his buttock and denies any new injuries.  Has stopped taking his antihypertensive medications a month ago.  BP is elevated 214/98.  Otherwise neurologically intact.  I did review the notes from the ER hospital in [place] including imaging studies.  Patient had a CT head that showed no traumatic injuries.  Had an x-ray of the right hand for which she had a finger dislocation and reduction.  Right hand is on the splint.  Also complaining of left chest wall pain for which he had an x-ray showed no rib fractures or pneumothorax.  Unclear at this point to why patient that until yesterday lived independently and now is unable to ambulate to the bathroom without falling.  He could be just soreness from a bed mechanical fall earlier today but daughter reports that she is unable to care for patient as she is much smaller than patient and unable to help him with ambulation.  He has no other help at home.  His EKG shows no signs of ischemia.  His COVID swab is negative.  His blood work shows mild stable anemia and thrombocytopenia, no signs of dehydration or worsening baseline creatinine.  UA with no signs of UTI.  We will get an MRI to rule out a stroke in the setting of gait instability and elevated BP.  In the meantime we will restart patient on his antihypertensive medications.  If MRI is positive for stroke will admit to the hospitalist service.  If otherwise both patient and daughter are requesting rehab  placement since they do not feel patient is safe at home.  We will consult social work at that point.  Patient placed on telemetry for monitoring of cardiorespiratory status.  History gathered from patient and his daughter      ____________________________________________  Please note:  Patient was evaluated in Emergency Department today for the symptoms described in the history of present illness. Patient was evaluated in the context of the global COVID-19 pandemic, which necessitated consideration that the patient might be at risk for infection with the SARS-CoV-2 virus that causes COVID-19. Institutional protocols and algorithms that pertain to the evaluation of patients at risk for COVID-19 are in a state of rapid change based on information released by regulatory bodies including the CDC and federal and state organizations. These policies and algorithms were followed during the patient's care in the ED.  Some ED evaluations and interventions may be delayed as a result of limited staffing during the pandemic.   ____________________________________________   FINAL CLINICAL IMPRESSION(S) / ED DIAGNOSES   Final diagnoses:  Generalized weakness  Fall, initial encounter      NEW MEDICATIONS STARTED DURING THIS VISIT:  ED Discharge Orders     None        Note:  This document was prepared using Dragon voice recognition software and may include unintentional dictation errors.    Alfred Levins, [place], MD 04/30/21 [phone]

## 2021-04-30 NOTE — Progress Notes (Signed)
PT Cancellation Note  Patient Details Name: Alex Smith MRN: [id number] DOB: June 11, 1935   Cancelled Treatment:    Reason Eval/Treat Not Completed: Medical issues which prohibited therapy; MD requested PT hold at this time pending outcome of further brain imaging to rule out hemorrhage.  Will attempt to see pt at a future date/time as medically appropriate.     Linus Salmons PT, DPT 04/30/21, 4:24 PM

## 2021-04-30 NOTE — ED Provider Notes (Signed)
  CT HEAD WO CONTRAST (5MM)  Result Date: 04/30/2021 CLINICAL DATA:  Neuro deficit. EXAM: CT HEAD WITHOUT CONTRAST TECHNIQUE: Contiguous axial images were obtained from the base of the skull through the vertex without intravenous contrast. COMPARISON:  MR head 04/30/2021 BRAIN: BRAIN Patchy and confluent areas of decreased attenuation are noted throughout the deep and periventricular white matter of the cerebral hemispheres bilaterally, compatible with chronic microvascular ischemic disease. No evidence of large-territorial acute infarction. No parenchymal hemorrhage. No mass lesion. No extra-axial collection. No mass effect or midline shift. No hydrocephalus. Basilar cisterns are patent. Vascular: No hyperdense vessel. Atherosclerotic calcifications are present within the cavernous internal carotid arteries. Skull: No acute fracture or focal lesion. Sinuses/Orbits: Paranasal sinuses and mastoid air cells are clear. Bilateral lens replacement. The orbits are unremarkable. Other: None. IMPRESSION: No acute intracranial abnormality. Electronically Signed   By: Iven Finn M.D.   On: 04/30/2021 17:59   MR BRAIN WO CONTRAST  Result Date: 04/30/2021 CLINICAL DATA:  Neuro deficit, acute, stroke suspected EXAM: MRI HEAD WITHOUT CONTRAST TECHNIQUE: Multiplanar, multiecho pulse sequences of the brain and surrounding structures were obtained without intravenous contrast. COMPARISON:  None. FINDINGS: Brain: There is curvilinear susceptibility in the right frontal lobe contiguous with a cortical vein (series 13, images 39-41). Subtle corresponding T2 FLAIR hyperintensity. There is no acute infarction. There is no intracranial mass or mass effect. There is no hydrocephalus or extra-axial fluid collection. Prominence of the ventricles and sulci reflects generalized parenchymal loss. Patchy T2 hyperintensity in the supratentorial white matter is nonspecific but may reflect underlying microvascular ischemic changes.  Vascular: Major vessel flow voids at the skull base are preserved. Skull and upper cervical spine: Normal marrow signal is preserved. Sinuses/Orbits: Mild mucosal thickening. Bilateral lens replacements. Other: Sella is unremarkable. Patchy right mastoid fluid opacification. IMPRESSION: Curvilinear possible abnormal signal in the right frontal lobe contiguous with a cortical vein. Considerations include minimal subarachnoid hemorrhage and cortical vein thrombosis. Head CT may be helpful. Electronically Signed   By: Macy Mis M.D.   On: 04/30/2021 11:34    Discussed the above MRI results with Dr. Joni Fears who is caring for the patient earlier today.  He advises that he felt likely these are incidental findings.   CT head reviewed, negative for acute finding.  In follow-up to the MRI the brain, I did order a CT scan to evaluate for possible intracranial hemorrhage.  CT of the head is negative for hemorrhage.  ----------------------------------------- 6:33 PM on 04/30/2021 ----------------------------------------- The patient is resting comfortably at this time. He is currently awaiting social work evaluation and additional care placement recommendations as guided by our transition of care team and physical therapy consult both which are pending at this time.  Patient's home medications ordered, I did opt to put him on hospital supplied insulin regimen for moderate diabetic we will continue to monitor his blood sugars   Delman Kitten, MD 04/30/21 [phone]

## 2021-04-30 NOTE — ED Notes (Signed)
Patient at MRI 

## 2021-04-30 NOTE — ED Notes (Signed)
ERT at bedside as sitter, checking CBG.

## 2021-04-30 NOTE — ED Notes (Signed)
Patient moved to hospital bed. Patient awaiting PT eval tomorrow to then get SNF placement. Son at bedside.

## 2021-04-30 NOTE — ED Notes (Signed)
Lunch tray placed at bedside at this time.

## 2021-04-30 NOTE — ED Notes (Signed)
PT at bedside.

## 2021-04-30 NOTE — ED Notes (Signed)
Patient given crackers, peanut butter, and apple sauce at this time. Family at bedside.

## 2021-04-30 NOTE — ED Notes (Signed)
MD at the bedside  

## 2021-04-30 NOTE — ED Notes (Signed)
Dietary called at this time for meal tray.

## 2021-04-30 NOTE — ED Notes (Signed)
Primofit placed on patient and explained use with pt's verbal understanding. Pt is requesting Tums, MD notified and aware

## 2021-04-30 NOTE — ED Provider Notes (Signed)
Patient is confused.  He does not seem to recall falling in [place].  His son is left and now the patient reports that he thinks he is in [place], is not sure as to how he ended up.  But he wants to go home.  Patient is trying to get up out of bed he is obviously confused, does not know the year or his location.  Seems to be a high fall risk he has had to been redirected to bed several times now by nurse.  Charge nurse working to obtain a Furniture conservator/restorer.  I have also ordered a dose of IV lorazepam to help with calling in for patient safety as he obviously represents a high fall risk and is very hard to redirect back to his bed currently attempting to get up and ambulate out of the hospital with associated confusion   Delman Kitten, MD 04/30/21 [id number]

## 2021-04-30 NOTE — ED Notes (Signed)
Patient's bed lien wet after coming back from MRI. Patient cleaned, bed liens changed and new male purewick applied at this time. Patient repositioned in bed. Family at bedside.

## 2021-04-30 NOTE — ED Notes (Addendum)
This RN assumes care of patient. Pt noted to be confused, alert to self. Multiple attempts to reorient pt to person, place, situation unsuccessful. Pt continues to remove all monitoring equipment with multiple attempts out of bed. MD notfied    Charge RN aware of need for sitter.

## 2021-05-01 ENCOUNTER — Emergency Department: Payer: Medicare Other

## 2021-05-01 ENCOUNTER — Encounter: Payer: Self-pay | Admitting: Radiology

## 2021-05-01 LAB — URINALYSIS, COMPLETE (UACMP) WITH MICROSCOPIC
Bacteria, UA: NONE SEEN
Bilirubin Urine: NEGATIVE
Glucose, UA: >=500 mg/dL — AB
Ketones, ur: 5 mg/dL — AB
Leukocytes,Ua: NEGATIVE
Nitrite: NEGATIVE
Protein, ur: 100 mg/dL — AB
Specific Gravity, Urine: 1.012 (ref 1.005–1.030)
pH: 5 (ref 5.0–8.0)

## 2021-05-01 LAB — CBC WITH DIFFERENTIAL/PLATELET
Abs Immature Granulocytes: 0.03 10*3/uL (ref 0.00–0.07)
Basophils Absolute: 0 10*3/uL (ref 0.0–0.1)
Basophils Relative: 1 %
Eosinophils Absolute: 0.1 10*3/uL (ref 0.0–0.5)
Eosinophils Relative: 2 %
HCT: 29.2 % — ABNORMAL LOW (ref 39.0–52.0)
Hemoglobin: 10 g/dL — ABNORMAL LOW (ref 13.0–17.0)
Immature Granulocytes: 1 %
Lymphocytes Relative: 15 %
Lymphs Abs: 0.7 10*3/uL (ref 0.7–4.0)
MCH: 35.7 pg — ABNORMAL HIGH (ref 26.0–34.0)
MCHC: 34.2 g/dL (ref 30.0–36.0)
MCV: 104.3 fL — ABNORMAL HIGH (ref 80.0–100.0)
Monocytes Absolute: 0.3 10*3/uL (ref 0.1–1.0)
Monocytes Relative: 6 %
Neutro Abs: 3.7 10*3/uL (ref 1.7–7.7)
Neutrophils Relative %: 75 %
Platelets: 99 10*3/uL — ABNORMAL LOW (ref 150–400)
RBC: 2.8 MIL/uL — ABNORMAL LOW (ref 4.22–5.81)
RDW: 14.5 % (ref 11.5–15.5)
WBC: 4.8 10*3/uL (ref 4.0–10.5)
nRBC: 0 % (ref 0.0–0.2)

## 2021-05-01 LAB — CBG MONITORING, ED
Glucose-Capillary: 325 mg/dL — ABNORMAL HIGH (ref 70–99)
Glucose-Capillary: 210 mg/dL — ABNORMAL HIGH (ref 70–99)
Glucose-Capillary: 224 mg/dL — ABNORMAL HIGH (ref 70–99)
Glucose-Capillary: 231 mg/dL — ABNORMAL HIGH (ref 70–99)

## 2021-05-01 LAB — PROTIME-INR
INR: 1.1 (ref 0.8–1.2)
Prothrombin Time: 14.1 seconds (ref 11.4–15.2)

## 2021-05-01 LAB — COMPREHENSIVE METABOLIC PANEL
ALT: 26 U/L (ref 0–44)
AST: 69 U/L — ABNORMAL HIGH (ref 15–41)
Albumin: 3.2 g/dL — ABNORMAL LOW (ref 3.5–5.0)
Alkaline Phosphatase: 59 U/L (ref 38–126)
Anion gap: 8 (ref 5–15)
BUN: 24 mg/dL — ABNORMAL HIGH (ref 8–23)
CO2: 24 mmol/L (ref 22–32)
Calcium: 8.3 mg/dL — ABNORMAL LOW (ref 8.9–10.3)
Chloride: 103 mmol/L (ref 98–111)
Creatinine, Ser: 1.39 mg/dL — ABNORMAL HIGH (ref 0.61–1.24)
GFR, Estimated: 50 mL/min — ABNORMAL LOW (ref >60)
Glucose, Bld: 220 mg/dL — ABNORMAL HIGH (ref 70–99)
Potassium: 3.5 mmol/L (ref 3.5–5.1)
Sodium: 135 mmol/L (ref 135–145)
Total Bilirubin: 1.3 mg/dL — ABNORMAL HIGH (ref 0.3–1.2)
Total Protein: 6.3 g/dL — ABNORMAL LOW (ref 6.5–8.1)

## 2021-05-01 LAB — LACTIC ACID, PLASMA: Lactic Acid, Venous: 1.6 mmol/L (ref 0.5–1.9)

## 2021-05-01 LAB — HEMOGLOBIN A1C
Hgb A1c MFr Bld: 5.7 % — ABNORMAL HIGH (ref 4.8–5.6)
Mean Plasma Glucose: 116.89 mg/dL

## 2021-05-01 LAB — APTT: aPTT: 28 seconds (ref 24–36)

## 2021-05-01 IMAGING — CT CT HEAD W/O CM
3 series · 15 of 47 positions shown, 18 images · non-contrast
Comparison: Brain MRI [DATE], CT head [DATE]

CLINICAL DATA: Altered mental status

EXAM:
CT HEAD WITHOUT CONTRAST
TECHNIQUE: Contiguous axial images were obtained from the base of the skull
through the vertex without intravenous contrast.

[Series 3: head wo · axial · 0.45mm/px · z∈[-127,+3]mm · 9 of 32 slices shown, 12 images]
[im 3/32  brain]
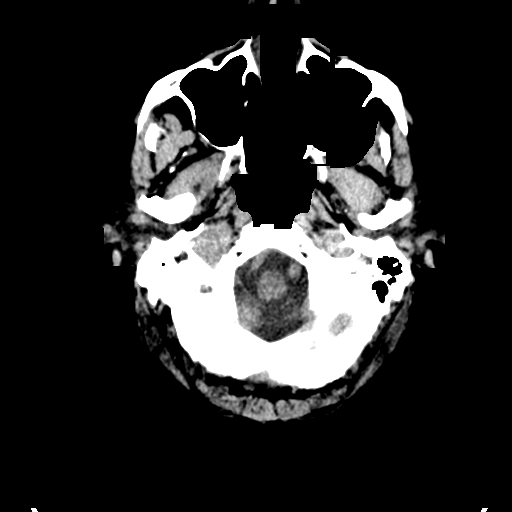
[im 3/32  bone]
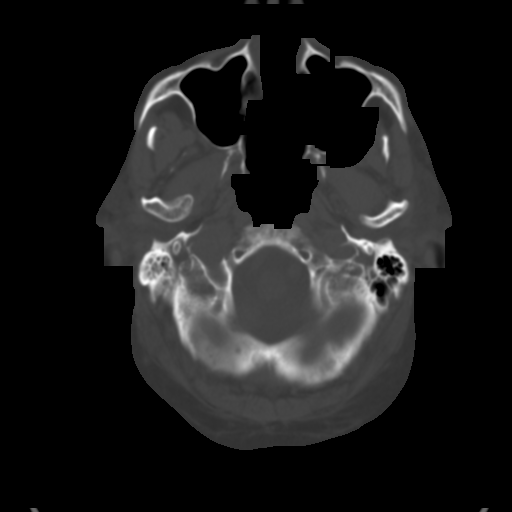
[im 6/32  brain]
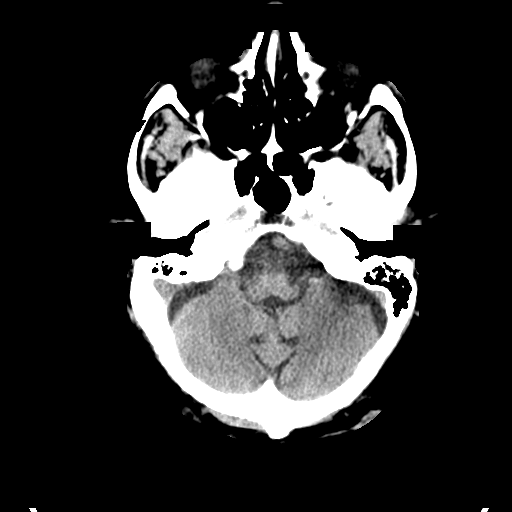
[im 9/32  brain]
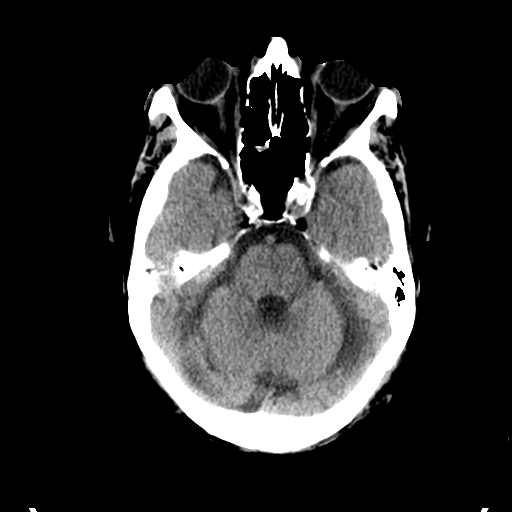
[im 12/32  brain]
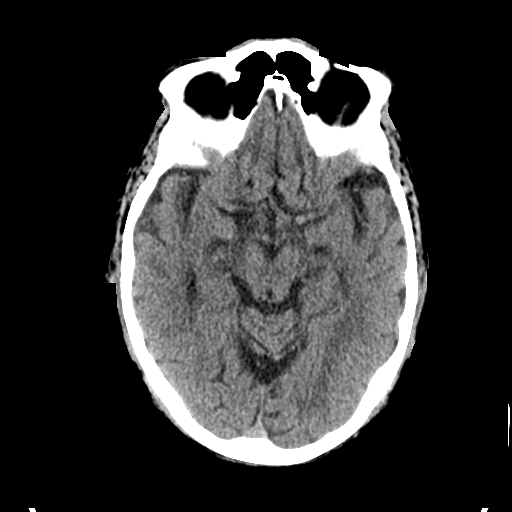
[im 17/32  brain]
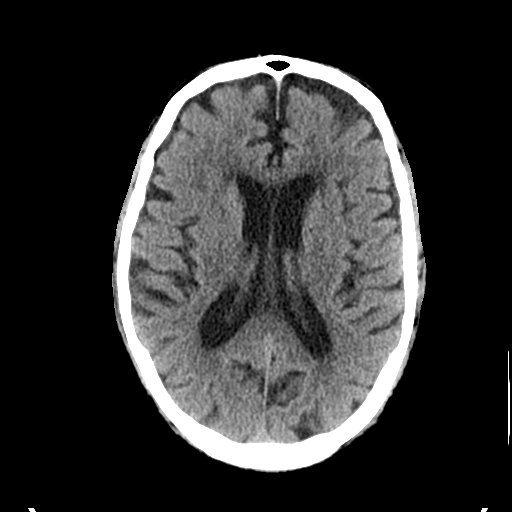
[im 17/32  bone]
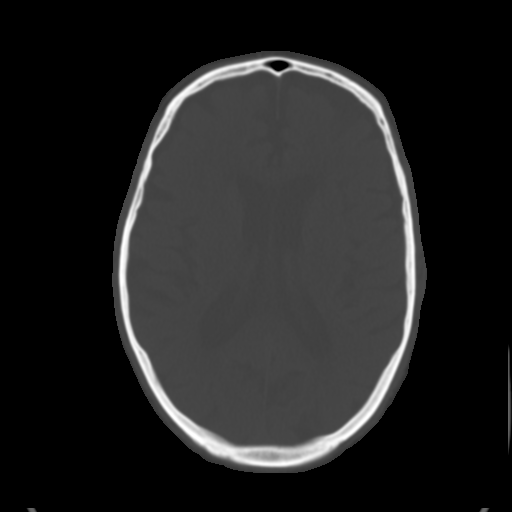
[im 20/32  brain]
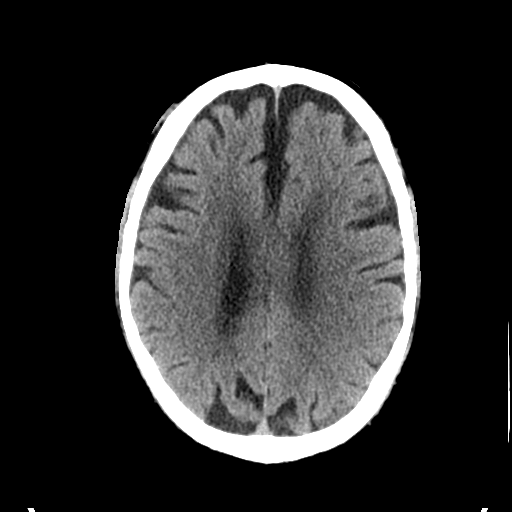
[im 23/32  brain]
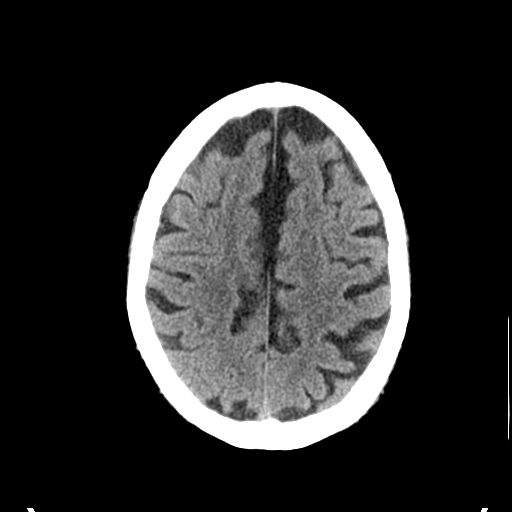
[im 26/32  brain]
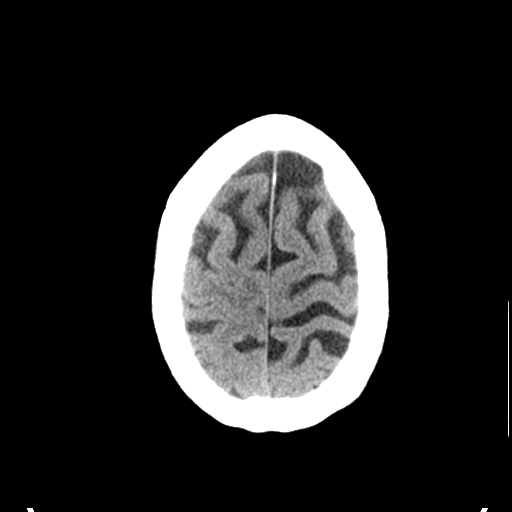
[im 29/32  brain]
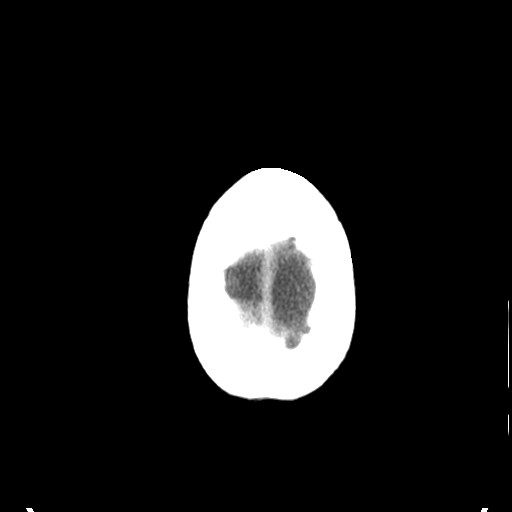
[im 29/32  bone]
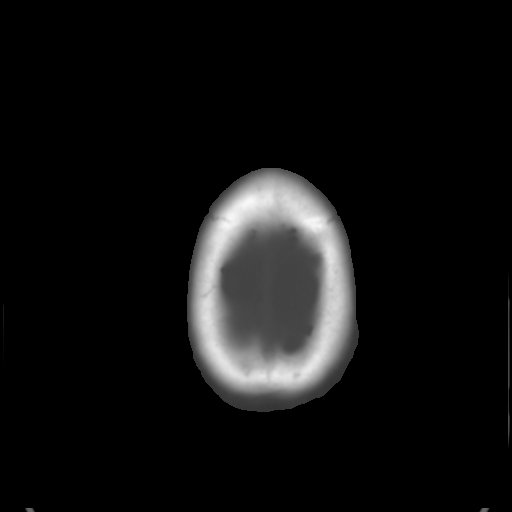

[Series 4: coronal soft tissue · coronal · 0.33mm/px · 3 of 74 slices shown]
[im 25/74  brain]
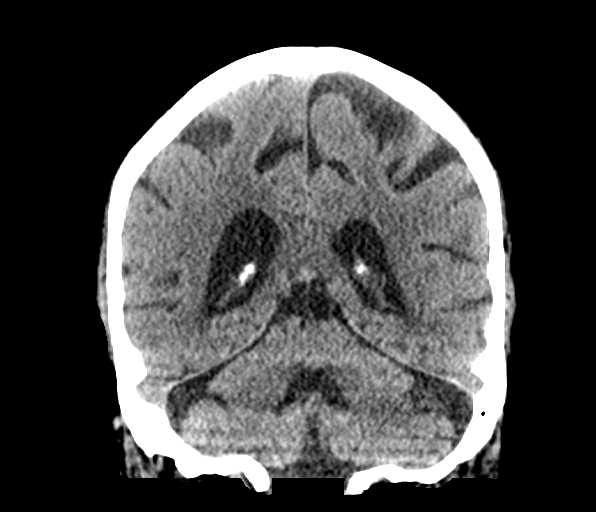
[im 33/74  brain]
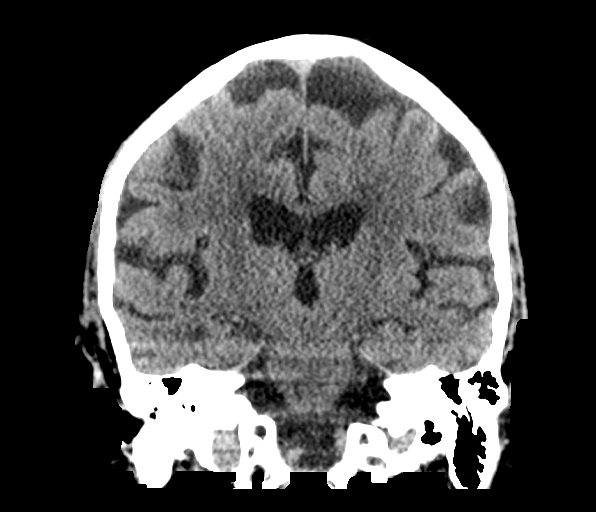
[im 41/74  brain]
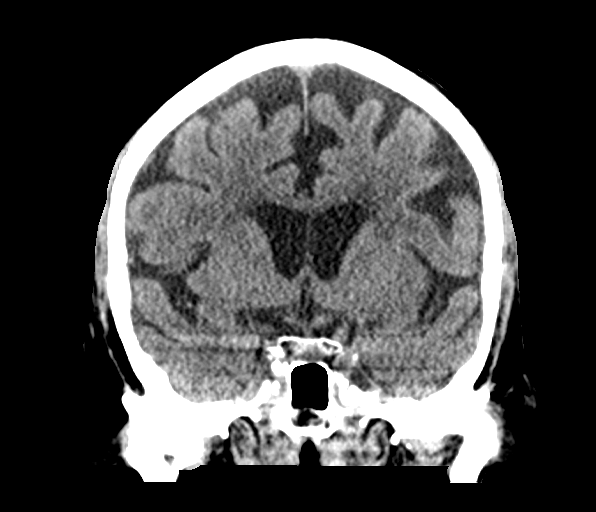

[Series 5: sagittal soft tissue · sagittal · 0.33mm/px · 3 of 56 slices shown]
[im 19/56  brain]
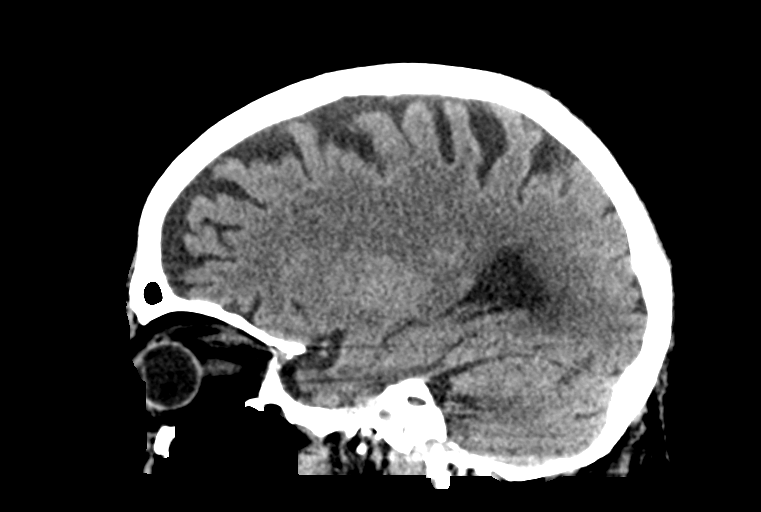
[im 28/56  brain]
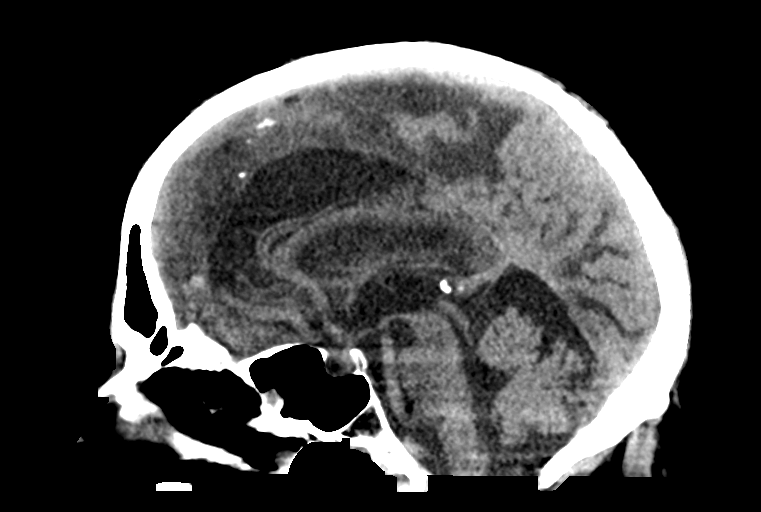
[im 37/56  brain]
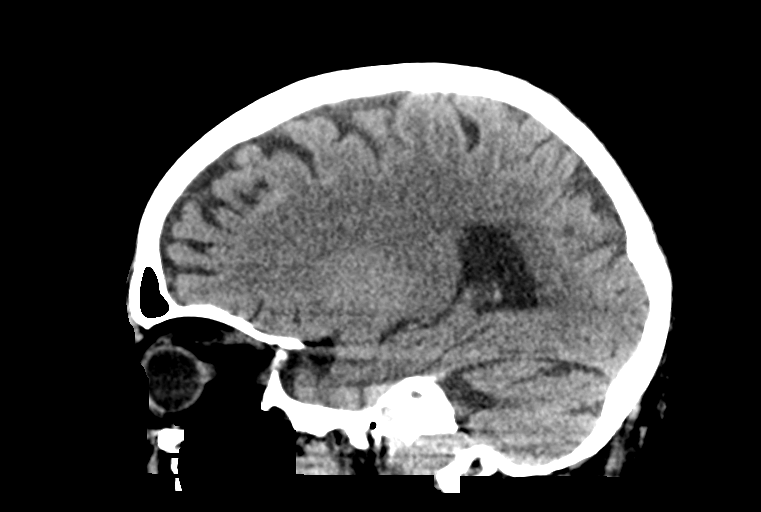

[15 of 47 positions shown; findings below may reference images not displayed]

FINDINGS: Brain: There is no acute intracranial hemorrhage, extra-axial fluid
collection, or infarct. The ventricles are stable in size. Mild
chronic white matter microangiopathic change is stable. There is no
mass lesion. There is no midline shift.

Vascular: There is calcification of the bilateral cavernous ICAs.

Skull: Normal. Negative for fracture or focal lesion.

Sinuses/Orbits: The imaged paranasal sinuses are clear. Bilateral
lens implants are in place. The globes and orbits are otherwise
unremarkable.

Other: None.
IMPRESSION: Stable appearance of the brain with no acute intracranial pathology.

## 2021-05-01 IMAGING — DX DG CHEST 1V PORT
1 series · 1 of 1 positions shown · non-contrast
Comparison: [DATE]

CLINICAL DATA: Fell, weakness

EXAM:
PORTABLE CHEST 1 VIEW

[chest ap]
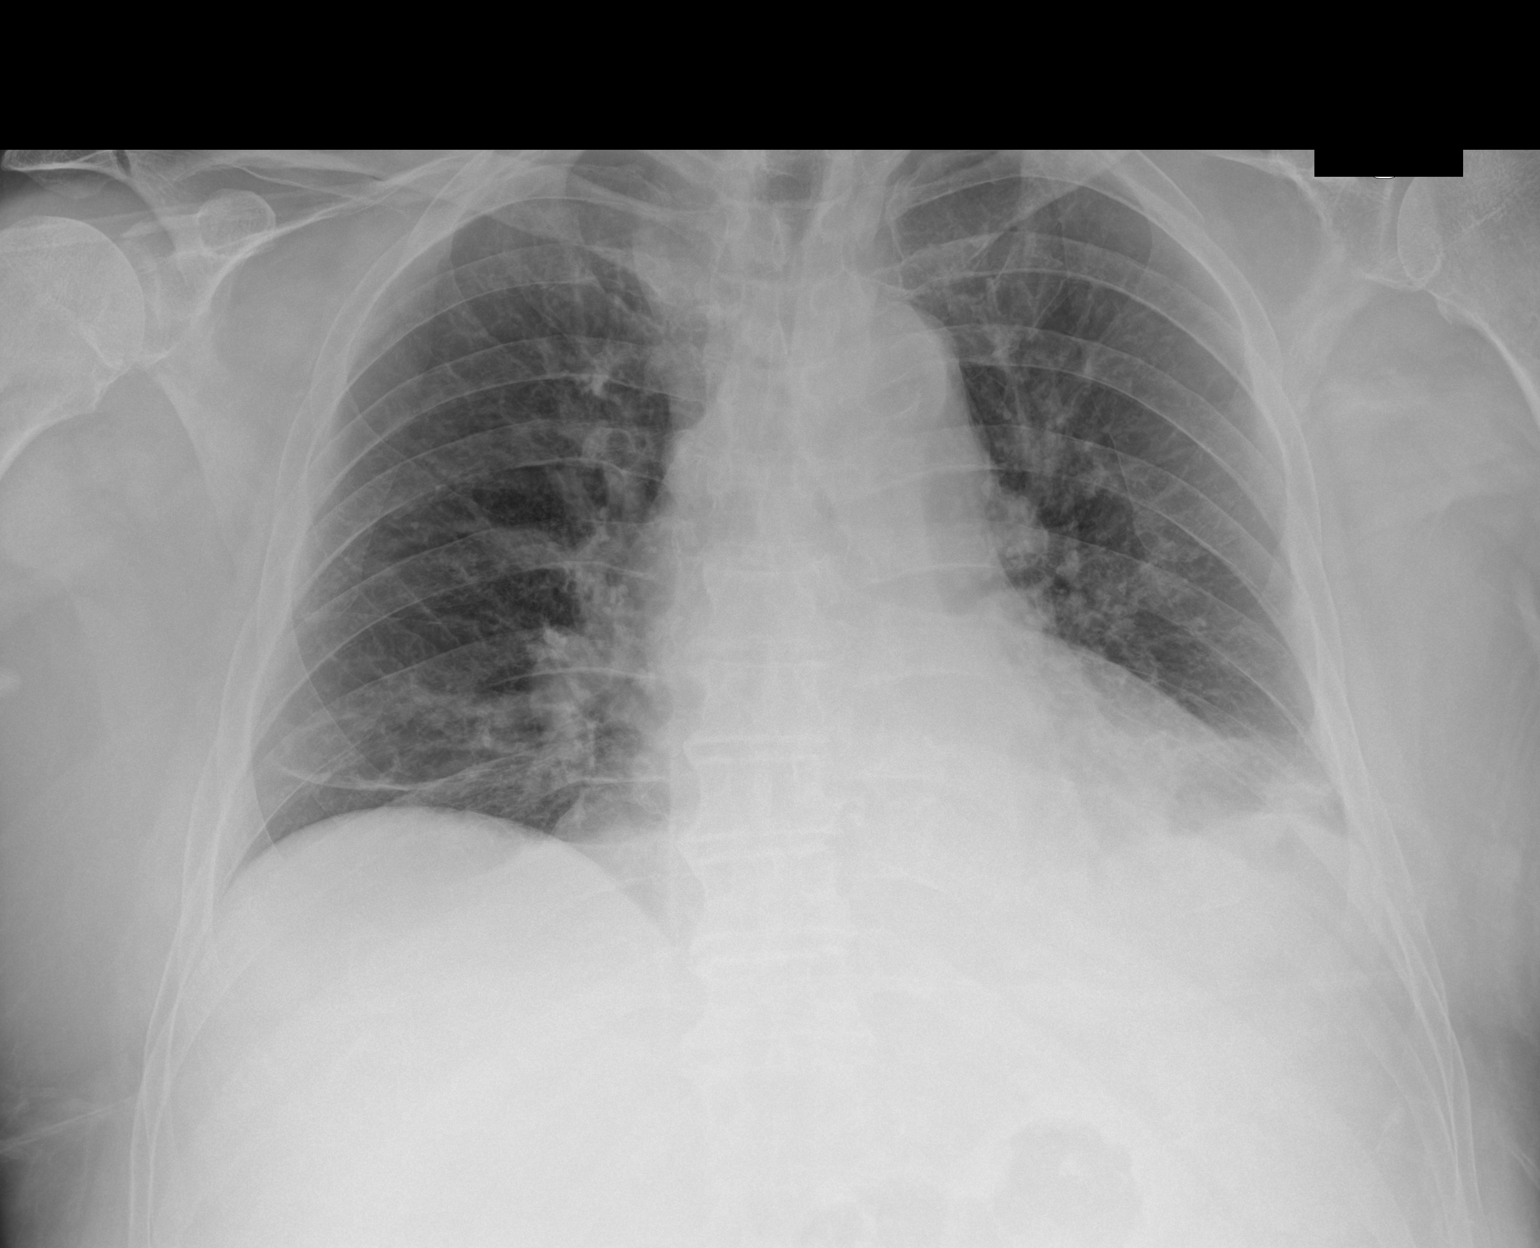

[1 of 1 positions shown; findings below may reference images not displayed]

FINDINGS: Single frontal view of the chest demonstrates an unremarkable
cardiac silhouette. Chronic scarring at the lung bases without
airspace disease, effusion, or pneumothorax. No acute bony
abnormalities.
IMPRESSION: 1. Stable chest, no acute process.

## 2021-05-01 MED ORDER — IOHEXOL 350 MG/ML SOLN
100.0000 mL | Freq: Once | INTRAVENOUS | Status: AC | PRN
  Administered 2021-05-01: 100 mL via INTRAVENOUS

## 2021-05-01 MED ORDER — AMLODIPINE BESYLATE 5 MG PO TABS
5.0000 mg | ORAL_TABLET | Freq: Once | ORAL | Status: AC
  Administered 2021-05-01: 5 mg via ORAL
  Filled 2021-05-01: qty 1

## 2021-05-01 MED ORDER — MIDAZOLAM HCL 2 MG/2ML IJ SOLN
2.0000 mg | Freq: Once | INTRAMUSCULAR | Status: AC
  Administered 2021-05-01: 2 mg via INTRAVENOUS
  Filled 2021-05-01: qty 2

## 2021-05-01 MED ORDER — HALOPERIDOL LACTATE 5 MG/ML IJ SOLN
2.0000 mg | Freq: Once | INTRAMUSCULAR | Status: AC | PRN
  Administered 2021-05-01: 2 mg via INTRAMUSCULAR
  Filled 2021-05-01: qty 1

## 2021-05-01 MED ORDER — LISINOPRIL 10 MG PO TABS
20.0000 mg | ORAL_TABLET | Freq: Once | ORAL | Status: AC
  Administered 2021-05-01: 20 mg via ORAL
  Filled 2021-05-01: qty 2

## 2021-05-01 MED ORDER — LORAZEPAM 0.5 MG PO TABS
0.5000 mg | ORAL_TABLET | Freq: Once | ORAL | Status: AC
  Administered 2021-05-02: 0.5 mg via ORAL
  Filled 2021-05-01: qty 1

## 2021-05-01 MED ORDER — SODIUM CHLORIDE 0.9 % IV SOLN
2.0000 g | INTRAVENOUS | Status: DC
  Administered 2021-05-01: 2 g via INTRAVENOUS
  Filled 2021-05-01: qty 20

## 2021-05-01 NOTE — TOC Initial Note (Addendum)
Transition of Care [facility]) - Initial/Assessment Note    Patient Details  Name: Alex Smith MRN: [id number] Date of Birth: 10/16/1934  Transition of Care [facility]) CM/SW Contact:    Ova Freshwater Phone Number: [phone] 05/01/2021, 9:07 AM  Clinical Narrative:                  Patient presents to [facility] ED due to fall.  Patient did his head when he fell.  CSW spoke with patient and his son Deegan, Valentino Pandora Leiter) [phone] [facility]), and other children [name] and Silt. CSW explained the role of TOC to in patient care and the difference between home health and SNF placement, along with possible timeline for both. CSW stated preferred placement could be noted but it cannot be guaranteed. PT has consult to assess the patient.  Patient stated he would prefer to go home with home health.  Patient's children all agree patient would be better of at a SNF facility since he fallen several times in the last month.   PT spoke with EDP and it was agreed PT would wait to assess patient once CT results came in.       Patient Goals and CMS Choice        Expected Discharge Plan and Services                                                Prior Living Arrangements/Services                       Activities of Daily Living      Permission Sought/Granted                  Emotional Assessment              Admission diagnosis:  EMS Fall/AMS Patient Active Problem List   Diagnosis Date Noted   Vitamin D deficiency 03/10/2019   Depressed mood 03/10/2019   CKD stage 3 due to type 2 diabetes mellitus ([place]) 11/14/2017   Goals of care, counseling/discussion 05/27/2017   Pancytopenia due to antineoplastic chemotherapy ([place]) 11/17/2016   Chemotherapy-induced neutropenia ([place]) 09/22/2016   Urinary incontinence 50/93/2671   Systolic murmur 24/58/0998   Myelodysplastic syndrome, low grade ([place]) 02/05/2016   Multiple myeloma not having achieved remission ([place])  02/05/2016   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   Myelodysplasia present in bone marrow ([place]) 11/03/2015   Advanced care planning/counseling discussion 05/22/2015   Insomnia 03/03/2015   Monoclonal gammopathy 02/03/2015   Macrocytic anemia 02/03/2015   Severe obesity (BMI 35.0-39.9) with comorbidity ([place]) 10/12/2014   History of nonmelanoma skin cancer 10/10/2014   Medicare annual wellness visit, subsequent 04/12/2014   Health maintenance examination 04/12/2014   Hairy cell leukemia, in remission ([place])    B12 deficiency    BPH with obstruction/lower urinary tract symptoms    Essential hypertension    GERD (gastroesophageal reflux disease)    Dyslipidemia    Type 2 diabetes, controlled, with retinopathy ([place])    Diverticulosis    Macular hole 07/07/2012   PCP:  Leonel Ramsay, MD Pharmacy:   [place], [place] [address] [place] [address] Phone: [phone] Fax: [phone]     Social Determinants of Health (SDOH) Interventions    Readmission Risk Interventions No  flowsheet data found.

## 2021-05-01 NOTE — ED Notes (Signed)
Tech trying to keep pt in bed by moving his legs back to the center of the bed and pt said "shut up". Tech informed pt that she was just trying to keep him safe and that he wasn't suppose to be standing, that it was her job to keep him in the bed and safe. PT tried kicking his leg at tech and then said "go to hell"  Tech got pt legs back in bed and pt continues restlessly moving around in bed and also taking gown over his head and getting tangled within it.

## 2021-05-01 NOTE — NC FL2 (Signed)
[place] LEVEL OF CARE SCREENING TOOL     IDENTIFICATION  Patient Name: Alex Smith Birthdate: [birth year] Sex: male Admission Date (Current Location): 04/30/2021  [facility] and [place] Number:  Engineering geologist and Address:  [facility], [address] [place], [place] [address]      Provider Number: [id number]  Attending Physician Name and Address:  No att. providers found  Relative Name and Phone Number:  Alex Smith, [name]   [phone] [facility])    Current Level of Care: Hospital Recommended Level of Care: [place] Prior Approval Number:    Date Approved/Denied:   PASRR Number: [id number]  Discharge Plan: SNF    Current Diagnoses: Patient Active Problem List   Diagnosis Date Noted   Vitamin D deficiency 03/10/2019   Depressed mood 03/10/2019   CKD stage 3 due to type 2 diabetes mellitus ([place]) 11/14/2017   Goals of care, counseling/discussion 05/27/2017   Pancytopenia due to antineoplastic chemotherapy ([place]) 11/17/2016   Chemotherapy-induced neutropenia ([place]) 09/22/2016   Urinary incontinence 45/99/7741   Systolic murmur 42/39/5320   Myelodysplastic syndrome, low grade ([place]) 02/05/2016   Multiple myeloma not having achieved remission ([place]) 02/05/2016   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   Myelodysplasia present in bone marrow ([place]) 11/03/2015   Advanced care planning/counseling discussion 05/22/2015   Insomnia 03/03/2015   Monoclonal gammopathy 02/03/2015   Macrocytic anemia 02/03/2015   Severe obesity (BMI 35.0-39.9) with comorbidity ([place]) 10/12/2014   History of nonmelanoma skin cancer 10/10/2014   Medicare annual wellness visit, subsequent 04/12/2014   Health maintenance examination 04/12/2014   Hairy cell leukemia, in remission ([place])    B12 deficiency    BPH with obstruction/lower urinary tract symptoms    Essential hypertension    GERD (gastroesophageal reflux disease)    Dyslipidemia     Type 2 diabetes, controlled, with retinopathy ([place])    Diverticulosis    Macular hole 07/07/2012    Orientation RESPIRATION BLADDER Height & Weight     Self, Place, Situation  Normal Continent Weight: 250 lb (113.4 kg) Height:  5' 7"  (170.2 cm)  BEHAVIORAL SYMPTOMS/MOOD NEUROLOGICAL BOWEL NUTRITION STATUS      Continent Diet  AMBULATORY STATUS COMMUNICATION OF NEEDS Skin   Extensive Assist Verbally Normal                       Personal Care Assistance Level of Assistance  Bathing, Dressing, Total care, Feeding Bathing Assistance: Limited assistance Feeding assistance: Independent Dressing Assistance: Limited assistance Total Care Assistance: Limited assistance   Functional Limitations Info  Sight, Hearing, Speech Sight Info: Adequate Hearing Info: Adequate Speech Info: Adequate    SPECIAL CARE FACTORS FREQUENCY  PT (By licensed PT), OT (By licensed OT)     PT Frequency: 5X per week OT Frequency: 5X per week            Contractures Contractures Info: Not present    Additional Factors Info          [place] COVID-19 Vaccine 04/14/2020 , 09/07/2019   Has both boosters as well         Current Medications (05/01/2021):  This is the current hospital active medication list Current Facility-Administered Medications  Medication Dose Route Frequency Provider Last Rate Last Admin   aspirin EC tablet 81 mg  81 mg Oral Daily Delman Kitten, MD   81 mg at 04/30/21 1833   haloperidol lactate (HALDOL) injection 2 mg  2 mg Intramuscular Once PRN  Naaman Plummer, MD       insulin aspart (novoLOG) injection 0-15 Units  0-15 Units Subcutaneous TID WC Delman Kitten, MD   11 Units at 05/01/21 0856   insulin aspart (novoLOG) injection 0-5 Units  0-5 Units Subcutaneous QHS Delman Kitten, MD   3 Units at 04/30/21 2049   pravastatin (PRAVACHOL) tablet 20 mg  20 mg Oral QPM Delman Kitten, MD   20 mg at 04/30/21 1833   Tbo-Filgrastim (GRANIX) injection 480 mcg  480 mcg Subcutaneous Once  Lequita Asal, MD       Current Outpatient Medications  Medication Sig Dispense Refill   aspirin EC 81 MG tablet Take 81 mg by mouth daily.     ciprofloxacin (CIPRO) 750 MG tablet Take 750 mg by mouth 2 (two) times daily.     DIPHENHYDRAMINE-ACETAMINOPHEN PO Take 1 tablet by mouth at bedtime.     doxazosin (CARDURA) 1 MG tablet TAKE ONE TABLET BY MOUTH EVERY DAY 30 tablet 5   famotidine (PEPCID) 40 MG tablet Take 0.5 tablets (20 mg total) by mouth daily. NEEDS OFFICE VISIT 15 tablet 0   HUMULIN N 100 UNIT/ML injection INJECT 39 UNITS SUBCUTANEOUSLY TWICE DAILY BEFORE A MEAL (Patient taking differently: Inject 1 Units into the skin as directed. Pt uses this as sliding scale) 20 mL 2   HUMULIN R 100 UNIT/ML injection INJECT 10 UNITS INTO THE SKIN TWICE DAILY WITH MEALS 10 mL 5   lisinopril (ZESTRIL) 20 MG tablet TAKE ONE TABLET BY MOUTH TWICE DAILY 180 tablet 0   omeprazole (PRILOSEC) 20 MG capsule Take 1 capsule by mouth 1 day or 1 dose.     pravastatin (PRAVACHOL) 20 MG tablet TAKE ONE TABLET EVERY EVENING 30 tablet 0   tamsulosin (FLOMAX) 0.4 MG CAPS capsule TAKE 1 CAPSULE BY MOUTH EVERY DAY 30 capsule 3   traZODone (DESYREL) 50 MG tablet TAKE 1/2 TO 1 TABLET BY MOUTH AT BEDTIMEAS NEEDED FOR SLEEP 30 tablet 0   vitamin B-12 (CYANOCOBALAMIN) 1000 MCG tablet Take 1 tablet (1,000 mcg total) by mouth daily. 90 tablet 3   vitamin E 400 UNIT capsule Take 200 Units by mouth daily.      glucose blood (ACCU-CHEK AVIVA PLUS) test strip USE TO CHECK SUGAR DAILY E11.319 200 each 3   hydrochlorothiazide (HYDRODIURIL) 25 MG tablet TAKE 1 TABLET BY MOUTH DAILY (Patient not taking: No sig reported) 90 tablet 1   Microlet Lancets MISC USE AS DIRECTED 200 each 3   temazepam (RESTORIL) 7.5 MG capsule Take 1 capsule by mouth daily. (Patient not taking: No sig reported)     ULTICARE INSULIN SYRINGE 31G X 5/16" 0.5 ML MISC See admin instructions.     Facility-Administered Medications Ordered in Other  Encounters  Medication Dose Route Frequency Provider Last Rate Last Admin   heparin lock flush 100 unit/mL  500 Units Intravenous Once Corcoran, Melissa C, MD       luspatercept-aamt (REBLOZYL) subcutaneous injection 100 mg  100 mg Subcutaneous Q30 days Sindy Guadeloupe, MD       luspatercept-aamt (REBLOZYL) subcutaneous injection 100 mg  100 mg Subcutaneous Q30 days Sindy Guadeloupe, MD   100 mg at 11/18/19 1252   luspatercept-aamt (REBLOZYL) subcutaneous injection 100 mg  100 mg Subcutaneous Q21 days Nolon Stalls C, MD   100 mg at 05/05/20 1447   luspatercept-aamt (REBLOZYL) subcutaneous injection 100 mg  100 mg Subcutaneous Q21 days Lequita Asal, MD   100 mg at 07/28/20 1447  sodium chloride 0.9 % injection 10 mL  10 mL Intravenous PRN Nolon Stalls C, MD   10 mL at 03/03/15 0903   sodium chloride 0.9 % injection 10 mL  10 mL Intracatheter PRN Nolon Stalls C, MD   10 mL at 03/10/15 1410   sodium chloride flush (NS) 0.9 % injection 10 mL  10 mL Intravenous PRN Lequita Asal, MD   10 mL at 07/23/18 1517     Discharge Medications: Please see discharge summary for a list of discharge medications.  Relevant Imaging Results:  Relevant Lab Results:   Additional Information SS# [id number]  Adelene Amas, LCSWA

## 2021-05-01 NOTE — ED Notes (Signed)
CBG 224

## 2021-05-01 NOTE — ED Notes (Signed)
Attempted scoot pt up in bed and placed in the middle of bed. Once bed was brought back to level, pt purposefully slid back to original position. Refused to allow nurse to put clothes on.

## 2021-05-01 NOTE — ED Notes (Signed)
Brief changed. Male external device changed. This tech and Kelly,RN pulled pt up in bed. This tech continues to sit 1:1 safety.

## 2021-05-01 NOTE — ED Notes (Signed)
Assisted pt to scoot up in the bed. Offered to put gown on. Pt declined again.

## 2021-05-01 NOTE — ED Notes (Signed)
Pt respositioned in bed. Pt sitting up eating dinner with assistance from daughter.

## 2021-05-01 NOTE — ED Notes (Signed)
This Probation officer stepped out of rm d/t family being at bedside. Family states he will be here a "while" and will notify nurse when he decides to leave and if any concerns arise so that this Probation officer can return as Actuary. Claiborne Billings, RN in rm to update family of POC.

## 2021-05-01 NOTE — ED Notes (Signed)
Pt continues to be very restless trying to get himself out of the bed. PT has not stopped moving since the PRN med was given. PT is increasingly more agitated. PT continues to have no orientation of time or place.

## 2021-05-01 NOTE — ED Notes (Signed)
Lunch tray given. Family member at bedside.

## 2021-05-01 NOTE — ED Notes (Signed)
ERT notifies this RN that patient has soiled himself. Pt given modified bed bath, provided appropriate peri care, full bed change & changed into gown with assist. Pt now in brief.

## 2021-05-01 NOTE — ED Notes (Signed)
Report given to Melissa RN

## 2021-05-01 NOTE — TOC Progression Note (Signed)
Transition of Care [facility]) - Progression Note    Patient Details  Name: Alex Smith MRN: [id number] Date of Birth: Mar 22, 1935  Transition [facility] Care [facility]) CM/SW [place], [place] Phone Number: [phone] 05/01/2021, 1:11 PM  Clinical Narrative:     SNF placement search started.  [facility] is preferred SNF, PASRR [id number].    Barriers to Discharge: No Barriers Identified  Expected Discharge Plan and Services                                                 Social Determinants of Health (SDOH) Interventions    Readmission Risk Interventions No flowsheet data found.

## 2021-05-01 NOTE — ED Notes (Signed)
Pt threw gown on the floor and laughs when tech asked if he wanted it back on. When tech tried to put gown back on he swatted at tech. Tech left gown off pt at this time as he is being too aggressive and tech is only one on one.

## 2021-05-01 NOTE — ED Notes (Signed)
This RN and night shift RN at bedside. Pt slumped down in bed with right leg hanging over side rail. Pt redirected by RN's to assist in repositioning pt in bed. Pt calm and cooperative at this time. Safety mats remain on floor beside bed for precaution. Pt awaiting PT consult this morning.

## 2021-05-01 NOTE — ED Notes (Signed)
Pt transported to CT ?

## 2021-05-01 NOTE — ED Provider Notes (Signed)
Patient becoming agitated with staff will give IM Versed.  On reassessment noted to be febrile to 101.4 mildly tachycardic but normotensive.  Nurse states that the urine taken earlier was very cloudy purulent appearing.  Will order Rocephin.  Will repeat sepsis labs order chest x-ray.  Will order CT abdomen to evaluate for stone, Pilo or other source of possible sepsis.  Also reportedly had recent diagnosis of Osteo though not appreciating significant pain or redness in his legs.  Patient signed out to oncoming physician pending imaging and labs.  Likely admission.   Merlyn Lot, MD 05/01/21 [phone]

## 2021-05-01 NOTE — ED Notes (Signed)
Pt appears restless wanting to pull male premafit off; throwing blankets off stating that they "need to be hemmed" ; pt given halidol and a busy blanket.

## 2021-05-01 NOTE — TOC Progression Note (Signed)
Transition of Care [facility]) - Progression Note    Patient Details  Name: Alex Smith MRN: [id number] Date of Birth: 04-04-1935  Transition of Care [facility]) CM/SW [place], [place] Phone Number: [phone] 05/01/2021, 9:32 AM  Clinical Narrative:     CSW spoke with EDP over concerns of patient's increased altered mental status.  Patient pending PT eval, for possible SNF placement.       Expected Discharge Plan and Services                                                 Social Determinants of Health (SDOH) Interventions    Readmission Risk Interventions No flowsheet data found.

## 2021-05-01 NOTE — ED Notes (Signed)
RN walked into room and pt gown was on the floor and brief tabs undone. RN asked why pt was naked and pt stated "I like being naked." RN offered to put gown back on and attach tabs on brief and pt declined.

## 2021-05-01 NOTE — ED Notes (Signed)
Assist pt 2 rn back in the middle of the bed and scooted head up.

## 2021-05-01 NOTE — ED Notes (Signed)
This Probation officer as Actuary, pt repositioned in bed by this Probation officer and [employer], Therapist, sports. Pt provided with water. No other needs voiced at this time.

## 2021-05-01 NOTE — ED Notes (Signed)
Pt repositioned in bed by RN and EDT

## 2021-05-01 NOTE — ED Notes (Signed)
Pt very restless at this time. Family member left shortly after 1900. Pt pulled his permafit off and is just in a brief and gown. Pt pulls at gown and throws blanket off and tries to take legs off the side of the bed.  Pt continues to be redirected. RN gave PRN Haldol order and will stay on the schedule of that med for now.  Pt is unaware of place or time.  One on one sitter at bedside.

## 2021-05-01 NOTE — ED Notes (Signed)
Pt is becoming aggressive toward tech saying "get the hell out of here" "you should be scared of me" Pt punch arm toward tech. And again kick his leg toward tech.

## 2021-05-01 NOTE — ED Notes (Signed)
Family at bedside and updated on POC  

## 2021-05-01 NOTE — Evaluation (Signed)
Physical Therapy Evaluation Patient Details Name: Alex Smith MRN: [id number] DOB: 05-Mar-1935 Today's Date: 05/01/2021  History of Present Illness  Pt is an 86 y.o. male with a history of anemia, BPH, CKD, GERD, hypertension, diabetes who presented for evaluation of generalized weakness and fall. Pt had a right finger dislocation and reduction. Per MRI impression "Considerations include minimal subarachnoid hemorrhage and cortical vein thrombosis. Head CT may be helpful." Pt with multiple follow up head CT with impression "Stable appearance of the brain with no acute intracranial pathology".   Clinical Impression  Pt was pleasantly confused but was able to follow most 1-step commands with extra time and curing and put forth good during the session. Pt required extensive assist with all functional tasks and presented with significant posterior instability in both sitting and standing.  Pt presented with poor righting reactions and even after anterior weight shifting activities remained unable to prevent posterior LOB in both sitting and standing without constant assist. Pt is at a very high risk of falls and would not be safe to return to his prior living situation at this time. Pt will benefit from PT services in a SNF setting upon discharge to safely address deficits listed in patient problem list for decreased caregiver assistance and eventual return to PLOF.         Recommendations for follow up therapy are one component of a multi-disciplinary discharge planning process, led by the attending physician.  Recommendations may be updated based on patient status, additional functional criteria and insurance authorization.  Follow Up Recommendations SNF;Supervision/Assistance - 24 hour    Equipment Recommendations  None recommended by PT    Recommendations for Other Services       Precautions / Restrictions Precautions Precautions: Fall Restrictions Weight Bearing Restrictions: No       Mobility  Bed Mobility Overal bed mobility: Needs Assistance Bed Mobility: Supine to Sit;Sit to Supine     Supine to sit: +2 for physical assistance;Mod assist;Max assist Sit to supine: +2 for physical assistance;Mod assist;Max assist   General bed mobility comments: heavy +2 assist for BLE and trunk control    Transfers Overall transfer level: Needs assistance Equipment used: Rolling walker (2 wheeled) Transfers: Sit to/from Stand Sit to Stand: +2 physical assistance;Mod assist         General transfer comment: constant assist to prevent posterior LOB  Ambulation/Gait Ambulation/Gait assistance: Mod assist;+2 physical assistance Gait Distance (Feet): 1 Feet Assistive device: Rolling walker (2 wheeled) Gait Pattern/deviations: Step-to pattern     General Gait Details: constant assist to prevent posterior LOB  Stairs            Wheelchair Mobility    Modified Rankin (Stroke Patients Only)       Balance Overall balance assessment: Needs assistance;History of Falls   Sitting balance-Leahy Scale: Poor Sitting balance - Comments: constant assist to prevent posterior LOB   Standing balance support: Bilateral upper extremity supported;During functional activity Standing balance-Leahy Scale: Poor Standing balance comment: constant assist to prevent posterior LOB                             Pertinent Vitals/Pain Pain Assessment: No/denies pain    Home Living Family/patient expects to be discharged to:: Private residence Living Arrangements: Alone Available Help at Discharge: Family;Available PRN/intermittently Type of Home: House Home Access: Stairs to enter Entrance Stairs-Rails: Right;Left;Can reach both Entrance Stairs-Number of Steps: 4 Home Layout: One level Home Equipment:  Walker - 2 wheels;Walker - 4 wheels;Cane - quad      Prior Function Level of Independence: Independent with assistive device(s)         Comments: Mostly Ind  amb without an AD with occasional QC use, Ind with ADLs, 6 falls in the last 6 months; history from son at bedside with pt unable to provide accurate details     Hand Dominance        Extremity/Trunk Assessment   Upper Extremity Assessment Upper Extremity Assessment: Generalized weakness    Lower Extremity Assessment Lower Extremity Assessment: Generalized weakness       Communication   Communication: No difficulties  Cognition Arousal/Alertness: Awake/alert Behavior During Therapy: WFL for tasks assessed/performed Overall Cognitive Status: Impaired/Different from baseline Area of Impairment: Attention;Orientation;Following commands                 Orientation Level: Place;Time;Situation     Following Commands: Follows one step commands inconsistently              General Comments      Exercises Other Exercises Other Exercises: Anterior weight shifting activities in sitting and standing   Assessment/Plan    PT Assessment Patient needs continued PT services  PT Problem List Decreased strength;Decreased activity tolerance;Decreased balance;Decreased mobility;Decreased knowledge of use of DME;Decreased safety awareness       PT Treatment Interventions DME instruction;Gait training;Functional mobility training;Therapeutic activities;Stair training;Therapeutic exercise;Patient/family education;Balance training    PT Goals (Current goals can be found in the Care Plan section)  Acute Rehab PT Goals Patient Stated Goal: To walk better PT Goal Formulation: With patient Time For Goal Achievement: 05/14/21 Potential to Achieve Goals: Fair    Frequency Min 2X/week   Barriers to discharge Inaccessible home environment;Decreased caregiver support      Co-evaluation               AM-PAC PT "6 Clicks" Mobility  Outcome Measure Help needed turning from your back to your side while in a flat bed without using bedrails?: A Lot Help needed moving from  lying on your back to sitting on the side of a flat bed without using bedrails?: A Lot Help needed moving to and from a bed to a chair (including a wheelchair)?: A Lot Help needed standing up from a chair using your arms (e.g., wheelchair or bedside chair)?: A Lot Help needed to walk in hospital room?: Total Help needed climbing 3-5 steps with a railing? : Total 6 Click Score: 10    End of Session Equipment Utilized During Treatment: Gait belt Activity Tolerance: Patient tolerated treatment well Patient left: in bed;Other (comment) (Pt with nursing preparing for transport for test/procedure at end of session) Nurse Communication: Mobility status PT Visit Diagnosis: Unsteadiness on feet (R26.81);History of falling (Z91.81);Difficulty in walking, not elsewhere classified (R26.2);Muscle weakness (generalized) (M62.81)    Time: [id number] PT Time Calculation (min) (ACUTE ONLY): 18 min   Charges:   PT Evaluation $PT Eval Moderate Complexity: 1 Mod         D. Scott Ishan Sanroman PT, DPT 05/01/21, 12:07 PM

## 2021-05-01 NOTE — Progress Notes (Signed)
Inpatient Diabetes Program Recommendations  AACE/ADA: New Consensus Statement on Inpatient Glycemic Control (2015)  Target Ranges:  Prepandial:   less than 140 mg/dL      Peak postprandial:   less than 180 mg/dL (1-2 hours)      Critically ill patients:  140 - 180 mg/dL   Lab Results  Component Value Date   GLUCAP 325 (H) 05/01/2021   HGBA1C 5.7 (H) 04/29/2021    Review of Glycemic Control Results for Alex Smith, Alex Smith (MRN [id number]) as of 05/01/2021 11:01  Ref. Range 04/30/2021 20:05 05/01/2021 08:50  Glucose-Capillary Latest Ref Range: 70 - 99 mg/dL 287 (H) 325 (H)   Diabetes history: DM 2 Outpatient Diabetes medications: Humulin N (NPH) ordered as 39 units bid, Humulin R (regular) 10 units bid with meals Current orders for Inpatient glycemic control:  Novolog 0-15 units tid+ hs  Inpatient Diabetes Program Recommendations:    - consider Levemir 8-10 units bid  Thanks,  Tama Headings RN, MSN, BC-ADM Inpatient Diabetes Coordinator Team Pager [phone] (8a-5p)

## 2021-05-02 ENCOUNTER — Encounter: Payer: Self-pay | Admitting: Family Medicine

## 2021-05-02 ENCOUNTER — Encounter (INDEPENDENT_AMBULATORY_CARE_PROVIDER_SITE_OTHER): Payer: Medicare Other | Admitting: Ophthalmology

## 2021-05-02 DIAGNOSIS — Z6839 Body mass index (BMI) 39.0-39.9, adult: Secondary | ICD-10-CM | POA: Diagnosis not present

## 2021-05-02 DIAGNOSIS — D649 Anemia, unspecified: Secondary | ICD-10-CM | POA: Diagnosis present

## 2021-05-02 DIAGNOSIS — N1831 Chronic kidney disease, stage 3a: Secondary | ICD-10-CM | POA: Diagnosis present

## 2021-05-02 DIAGNOSIS — R531 Weakness: Secondary | ICD-10-CM | POA: Diagnosis not present

## 2021-05-02 DIAGNOSIS — Z9181 History of falling: Secondary | ICD-10-CM | POA: Diagnosis not present

## 2021-05-02 DIAGNOSIS — D696 Thrombocytopenia, unspecified: Secondary | ICD-10-CM | POA: Diagnosis present

## 2021-05-02 DIAGNOSIS — N400 Enlarged prostate without lower urinary tract symptoms: Secondary | ICD-10-CM | POA: Diagnosis present

## 2021-05-02 DIAGNOSIS — Z23 Encounter for immunization: Secondary | ICD-10-CM | POA: Diagnosis present

## 2021-05-02 DIAGNOSIS — R410 Disorientation, unspecified: Secondary | ICD-10-CM | POA: Diagnosis not present

## 2021-05-02 DIAGNOSIS — G9341 Metabolic encephalopathy: Secondary | ICD-10-CM | POA: Diagnosis present

## 2021-05-02 DIAGNOSIS — R4182 Altered mental status, unspecified: Secondary | ICD-10-CM | POA: Diagnosis not present

## 2021-05-02 DIAGNOSIS — Z20822 Contact with and (suspected) exposure to covid-19: Secondary | ICD-10-CM | POA: Diagnosis present

## 2021-05-02 DIAGNOSIS — W19XXXA Unspecified fall, initial encounter: Secondary | ICD-10-CM

## 2021-05-02 DIAGNOSIS — E1165 Type 2 diabetes mellitus with hyperglycemia: Secondary | ICD-10-CM | POA: Diagnosis present

## 2021-05-02 DIAGNOSIS — A419 Sepsis, unspecified organism: Secondary | ICD-10-CM | POA: Diagnosis present

## 2021-05-02 DIAGNOSIS — J189 Pneumonia, unspecified organism: Secondary | ICD-10-CM

## 2021-05-02 DIAGNOSIS — E785 Hyperlipidemia, unspecified: Secondary | ICD-10-CM | POA: Diagnosis present

## 2021-05-02 DIAGNOSIS — L89611 Pressure ulcer of right heel, stage 1: Secondary | ICD-10-CM | POA: Diagnosis not present

## 2021-05-02 DIAGNOSIS — K5790 Diverticulosis of intestine, part unspecified, without perforation or abscess without bleeding: Secondary | ICD-10-CM | POA: Diagnosis present

## 2021-05-02 DIAGNOSIS — D7589 Other specified diseases of blood and blood-forming organs: Secondary | ICD-10-CM | POA: Diagnosis present

## 2021-05-02 DIAGNOSIS — Z7982 Long term (current) use of aspirin: Secondary | ICD-10-CM | POA: Diagnosis not present

## 2021-05-02 DIAGNOSIS — R6520 Severe sepsis without septic shock: Secondary | ICD-10-CM | POA: Diagnosis present

## 2021-05-02 DIAGNOSIS — S0083XA Contusion of other part of head, initial encounter: Secondary | ICD-10-CM | POA: Diagnosis present

## 2021-05-02 DIAGNOSIS — E538 Deficiency of other specified B group vitamins: Secondary | ICD-10-CM | POA: Diagnosis present

## 2021-05-02 DIAGNOSIS — I129 Hypertensive chronic kidney disease with stage 1 through stage 4 chronic kidney disease, or unspecified chronic kidney disease: Secondary | ICD-10-CM | POA: Diagnosis present

## 2021-05-02 DIAGNOSIS — K219 Gastro-esophageal reflux disease without esophagitis: Secondary | ICD-10-CM | POA: Diagnosis present

## 2021-05-02 DIAGNOSIS — W010XXA Fall on same level from slipping, tripping and stumbling without subsequent striking against object, initial encounter: Secondary | ICD-10-CM | POA: Diagnosis present

## 2021-05-02 DIAGNOSIS — E1122 Type 2 diabetes mellitus with diabetic chronic kidney disease: Secondary | ICD-10-CM | POA: Diagnosis present

## 2021-05-02 LAB — CORTISOL-AM, BLOOD: Cortisol - AM: 14.3 ug/dL (ref 6.7–22.6)

## 2021-05-02 LAB — BASIC METABOLIC PANEL
Anion gap: 6 (ref 5–15)
BUN: 23 mg/dL (ref 8–23)
CO2: 25 mmol/L (ref 22–32)
Calcium: 8.3 mg/dL — ABNORMAL LOW (ref 8.9–10.3)
Chloride: 104 mmol/L (ref 98–111)
Creatinine, Ser: 1.34 mg/dL — ABNORMAL HIGH (ref 0.61–1.24)
GFR, Estimated: 52 mL/min — ABNORMAL LOW (ref >60)
Glucose, Bld: 230 mg/dL — ABNORMAL HIGH (ref 70–99)
Potassium: 4.1 mmol/L (ref 3.5–5.1)
Sodium: 135 mmol/L (ref 135–145)

## 2021-05-02 LAB — CBC
HCT: 30.5 % — ABNORMAL LOW (ref 39.0–52.0)
Hemoglobin: 10.7 g/dL — ABNORMAL LOW (ref 13.0–17.0)
MCH: 36.4 pg — ABNORMAL HIGH (ref 26.0–34.0)
MCHC: 35.1 g/dL (ref 30.0–36.0)
MCV: 103.7 fL — ABNORMAL HIGH (ref 80.0–100.0)
Platelets: 93 10*3/uL — ABNORMAL LOW (ref 150–400)
RBC: 2.94 MIL/uL — ABNORMAL LOW (ref 4.22–5.81)
RDW: 14.6 % (ref 11.5–15.5)
WBC: 4.1 10*3/uL (ref 4.0–10.5)
nRBC: 0 % (ref 0.0–0.2)

## 2021-05-02 LAB — CBG MONITORING, ED
Glucose-Capillary: 216 mg/dL — ABNORMAL HIGH (ref 70–99)
Glucose-Capillary: 236 mg/dL — ABNORMAL HIGH (ref 70–99)

## 2021-05-02 LAB — PROCALCITONIN: Procalcitonin: 0.22 ng/mL

## 2021-05-02 LAB — RESP PANEL BY RT-PCR (FLU A&B, COVID) ARPGX2
Influenza A by PCR: NEGATIVE
Influenza B by PCR: NEGATIVE
SARS Coronavirus 2 by RT PCR: NEGATIVE

## 2021-05-02 LAB — LACTIC ACID, PLASMA: Lactic Acid, Venous: 1.4 mmol/L (ref 0.5–1.9)

## 2021-05-02 LAB — PROTIME-INR
INR: 1.1 (ref 0.8–1.2)
Prothrombin Time: 14.1 seconds (ref 11.4–15.2)

## 2021-05-02 MED ORDER — SODIUM CHLORIDE 0.9 % IV SOLN: INTRAVENOUS | Status: DC

## 2021-05-02 MED ORDER — VANCOMYCIN HCL IN DEXTROSE 1-5 GM/200ML-% IV SOLN
1000.0000 mg | Freq: Once | INTRAVENOUS | Status: AC
  Administered 2021-05-02: 1000 mg via INTRAVENOUS
  Filled 2021-05-02: qty 200

## 2021-05-02 MED ORDER — ENOXAPARIN SODIUM 60 MG/0.6ML IJ SOSY
55.0000 mg | PREFILLED_SYRINGE | Freq: Every day | INTRAMUSCULAR | Status: DC
  Administered 2021-05-02 – 2021-05-11 (×10): 55 mg via SUBCUTANEOUS
  Filled 2021-05-02 (×10): qty 0.6

## 2021-05-02 MED ORDER — DIPHENHYDRAMINE HCL 12.5 MG/5ML PO ELIX
12.5000 mg | ORAL_SOLUTION | Freq: Every day | ORAL | Status: DC
  Administered 2021-05-02 – 2021-05-07 (×5): 12.5 mg via ORAL
  Filled 2021-05-02 (×7): qty 5

## 2021-05-02 MED ORDER — ACETAMINOPHEN 325 MG PO TABS: 650.0000 mg | ORAL_TABLET | Freq: Four times a day (QID) | ORAL | Status: DC | PRN

## 2021-05-02 MED ORDER — ACETAMINOPHEN 325 MG RE SUPP
650.0000 mg | Freq: Four times a day (QID) | RECTAL | Status: DC | PRN
  Filled 2021-05-02: qty 2

## 2021-05-02 MED ORDER — GUAIFENESIN ER 600 MG PO TB12
600.0000 mg | ORAL_TABLET | Freq: Two times a day (BID) | ORAL | Status: DC
  Administered 2021-05-02 – 2021-05-11 (×17): 600 mg via ORAL
  Filled 2021-05-02 (×17): qty 1

## 2021-05-02 MED ORDER — TRAZODONE HCL 50 MG PO TABS
25.0000 mg | ORAL_TABLET | Freq: Every evening | ORAL | Status: DC | PRN
  Administered 2021-05-07 – 2021-05-11 (×4): 25 mg via ORAL
  Filled 2021-05-02 (×5): qty 1

## 2021-05-02 MED ORDER — SODIUM CHLORIDE 0.9 % IV SOLN
25.0000 mg | INTRAVENOUS | Status: DC | PRN
  Administered 2021-05-02 – 2021-05-03 (×2): 25 mg via INTRAVENOUS
  Filled 2021-05-02 (×3): qty 0.5

## 2021-05-02 MED ORDER — MAGNESIUM HYDROXIDE 400 MG/5ML PO SUSP: 30.0000 mL | Freq: Every day | ORAL | Status: DC | PRN

## 2021-05-02 MED ORDER — SODIUM CHLORIDE 0.9 % IV SOLN
2.0000 g | Freq: Once | INTRAVENOUS | Status: AC
  Administered 2021-05-02: 2 g via INTRAVENOUS
  Filled 2021-05-02: qty 2

## 2021-05-02 MED ORDER — LACTATED RINGERS IV BOLUS (SEPSIS)
1000.0000 mL | Freq: Once | INTRAVENOUS | Status: AC
  Administered 2021-05-02: 1000 mL via INTRAVENOUS

## 2021-05-02 MED ORDER — PANTOPRAZOLE SODIUM 40 MG PO TBEC
40.0000 mg | DELAYED_RELEASE_TABLET | Freq: Every day | ORAL | Status: DC
  Administered 2021-05-02 – 2021-05-11 (×10): 40 mg via ORAL
  Filled 2021-05-02 (×10): qty 1

## 2021-05-02 MED ORDER — SODIUM CHLORIDE 0.9 % IV SOLN
500.0000 mg | INTRAVENOUS | Status: DC
  Administered 2021-05-02 – 2021-05-05 (×4): 500 mg via INTRAVENOUS
  Filled 2021-05-02 (×5): qty 500

## 2021-05-02 MED ORDER — POTASSIUM CHLORIDE 20 MEQ PO PACK
40.0000 meq | PACK | Freq: Once | ORAL | Status: AC
  Administered 2021-05-02: 40 meq via ORAL
  Filled 2021-05-02: qty 2

## 2021-05-02 MED ORDER — ACETAMINOPHEN 500 MG PO TABS
1000.0000 mg | ORAL_TABLET | Freq: Once | ORAL | Status: AC
  Administered 2021-05-02: 1000 mg via ORAL
  Filled 2021-05-02: qty 2

## 2021-05-02 MED ORDER — FENTANYL CITRATE PF 50 MCG/ML IJ SOSY
25.0000 ug | PREFILLED_SYRINGE | INTRAMUSCULAR | Status: DC | PRN
  Administered 2021-05-02 – 2021-05-03 (×2): 25 ug via INTRAVENOUS
  Filled 2021-05-02 (×2): qty 1

## 2021-05-02 MED ORDER — SODIUM CHLORIDE 0.9 % IV SOLN
2.0000 g | INTRAVENOUS | Status: DC
  Administered 2021-05-02 – 2021-05-04 (×3): 2 g via INTRAVENOUS
  Filled 2021-05-02: qty 20
  Filled 2021-05-02: qty 2
  Filled 2021-05-02 (×2): qty 20

## 2021-05-02 MED ORDER — SODIUM CHLORIDE 0.9 % IV SOLN: 2.0000 g | INTRAVENOUS | Status: DC

## 2021-05-02 MED ORDER — VITAMIN B-12 1000 MCG PO TABS
1000.0000 ug | ORAL_TABLET | Freq: Every day | ORAL | Status: DC
  Administered 2021-05-02 – 2021-05-11 (×10): 1000 ug via ORAL
  Filled 2021-05-02 (×10): qty 1

## 2021-05-02 MED ORDER — INSULIN NPH (HUMAN) (ISOPHANE) 100 UNIT/ML ~~LOC~~ SUSP: 1.0000 [IU] | SUBCUTANEOUS | Status: DC

## 2021-05-02 MED ORDER — VITAMIN E 45 MG (100 UNIT) PO CAPS
200.0000 [IU] | ORAL_CAPSULE | Freq: Every day | ORAL | Status: DC
  Administered 2021-05-02 – 2021-05-11 (×10): 200 [IU] via ORAL
  Filled 2021-05-02 (×10): qty 2

## 2021-05-02 MED ORDER — TRAZODONE HCL 50 MG PO TABS: 50.0000 mg | ORAL_TABLET | Freq: Every evening | ORAL | Status: DC | PRN

## 2021-05-02 MED ORDER — HALOPERIDOL LACTATE 5 MG/ML IJ SOLN
2.0000 mg | Freq: Four times a day (QID) | INTRAMUSCULAR | Status: DC | PRN
  Administered 2021-05-02: 2 mg via INTRAMUSCULAR
  Filled 2021-05-02: qty 1

## 2021-05-02 MED ORDER — ONDANSETRON HCL 4 MG/2ML IJ SOLN: 4.0000 mg | Freq: Four times a day (QID) | INTRAMUSCULAR | Status: DC | PRN

## 2021-05-02 MED ORDER — LACTATED RINGERS IV BOLUS (SEPSIS)
200.0000 mL | Freq: Once | INTRAVENOUS | Status: DC
Start: 2021-05-02 — End: 2021-05-03

## 2021-05-02 MED ORDER — LABETALOL HCL 5 MG/ML IV SOLN: 20.0000 mg | INTRAVENOUS | Status: DC | PRN

## 2021-05-02 MED ORDER — ONDANSETRON HCL 4 MG PO TABS: 4.0000 mg | ORAL_TABLET | Freq: Four times a day (QID) | ORAL | Status: DC | PRN

## 2021-05-02 MED ORDER — DIPHENHYDRAMINE-ACETAMINOPHEN 12.5-500 MG PO TABS: ORAL_TABLET | Freq: Every day | ORAL | Status: DC

## 2021-05-02 MED ORDER — TAMSULOSIN HCL 0.4 MG PO CAPS
0.4000 mg | ORAL_CAPSULE | Freq: Every day | ORAL | Status: DC
  Administered 2021-05-02 – 2021-05-11 (×10): 0.4 mg via ORAL
  Filled 2021-05-02 (×11): qty 1

## 2021-05-02 MED ORDER — HYDROCOD POLST-CPM POLST ER 10-8 MG/5ML PO SUER: 5.0000 mL | Freq: Two times a day (BID) | ORAL | Status: DC | PRN

## 2021-05-02 MED ORDER — TEMAZEPAM 7.5 MG PO CAPS: 7.5000 mg | ORAL_CAPSULE | Freq: Every day | ORAL | Status: DC

## 2021-05-02 NOTE — ED Notes (Signed)
Pt keeps trying to get out  of bed. Redirected pt and placed legs back in bed. Pt moving restlessly. Pt keeps taking blanket off.

## 2021-05-02 NOTE — Progress Notes (Signed)
Patient seen and examined at the bedside.  He is confused and unable to provide any history.  Vital signs are stable.  Continue current management plan.

## 2021-05-02 NOTE — ED Notes (Signed)
Pt given meal tray at this time 

## 2021-05-02 NOTE — ED Provider Notes (Signed)
-----------------------------------------   12:28 AM on 05/02/2021 -----------------------------------------   Tylenol ordered for fever spike.  CT abdomen/pelvis remarkable for bibasilar infiltrates, small bilateral pleural effusions and cholelithiasis.  ED code sepsis initiated.  30 cc/kilo IV fluids and broad-spectrum IV antibiotics to cover HCAP will be administered.  Will discuss with hospitalist services for admission.  CT abdomen pelvis interpreted per Dr. Sherrye Payor:  1. Mild to moderate severity bibasilar atelectasis and/or  infiltrate.  2. Very small bilateral pleural effusions.  3. Cholelithiasis.  4. Sigmoid diverticulosis.  5. Aortic atherosclerosis.     Paulette Blanch, MD 05/02/21 [phone]

## 2021-05-02 NOTE — Consult Note (Signed)
PHARMACY -  BRIEF ANTIBIOTIC NOTE   Pharmacy has received consult(s) for cefepime and vancomycin from an ED provider.  The patient's profile has been reviewed for ht/wt/allergies/indication/available labs.    One time order(s) placed for . Cefepime 2 gram Vancomycin 1000 mg   Further antibiotics/pharmacy consults should be ordered by admitting physician if indicated.                       Thank you, Dorothe Pea, PharmD, BCPS Clinical Pharmacist   05/02/2021  12:31 AM

## 2021-05-02 NOTE — Sepsis Progress Note (Signed)
Following per sepsis protocol   

## 2021-05-02 NOTE — Consult Note (Signed)
CODE SEPSIS - PHARMACY COMMUNICATION  **Broad Spectrum Antibiotics should be administered within 1 hour of Sepsis diagnosis**  Time Code Sepsis Called/Page Received: 0027  Antibiotics Ordered: 0027  Time of 1st antibiotic administration: [place] ,PharmD, BCPS Clinical Pharmacist  05/02/2021  12:31 AM

## 2021-05-02 NOTE — ED Notes (Signed)
Since pt has had antibiotics and some calming meds he seems to be more alert and acquainted with place..Still not aware of time.  Pt is still a restless and will adjust himself around in the bed. He will say yes if you ask him if he needs to urinate. Pt says he is hot and does not want the covers on him at this time.  Pt has been re-adjusted in bed bu staff as well to try and keep his head high in the bed.  One on one sitter reminds pt to try and rest when he begins to act like he is trying to get up.  Pt had a few sips of water.

## 2021-05-02 NOTE — TOC Progression Note (Addendum)
Transition of Care [facility]) - Progression Note    Patient Details  Name: WOOD AXELSON MRN: [id number] Date of Birth: 1934-08-22  Transition of Care [facility]) CM/SW [place], [place] Phone Number: [phone] 05/02/2021, 9:26 AM  Clinical Narrative:     Patient's is increasingly confused. Patient required a sitter last night.  Patient was febrile last night and required 2L O2, sepsis protocol started. CSW spoke with Magda Paganini at [facility] who stated they would not consider patient for SNF placement of he required a sitter and the patient would have to be sitter free foe 48 hours for placement consideration.    Barriers to Discharge: No Barriers Identified  Expected Discharge Plan and Services                                                 Social Determinants of Health (SDOH) Interventions    Readmission Risk Interventions No flowsheet data found.

## 2021-05-02 NOTE — H&P (Addendum)
[place]   PATIENT NAME: Alex Smith    MR#:  [id number]  DATE OF BIRTH:  [birth year]  DATE OF ADMISSION:  04/30/2021  PRIMARY CARE PHYSICIAN: Leonel Ramsay, MD   Patient is coming from: Home  REQUESTING/REFERRING PHYSICIAN: Lurline Hare, MD  CHIEF COMPLAINT:   Chief Complaint  Patient presents with  . Weakness    HISTORY OF PRESENT ILLNESS:  Alex Smith is a 86 y.o. Caucasian male with medical history significant for  anemia, BPH, CKD, GERD, hypertension, diabetes and multiple medical problems that are mentioned below, who presented to the ER with acute onset of generalized weakness and after a fall on 9/12.  According to his daughter he has been having memory deficits and has been feeling progressively weak.  He went to [place] this weekend for a funeral and after leaving the funeral he tripped and fell an accidental mechanical fall with subsequent left-sided facial trauma and head trauma as well as left-sided chest trauma.  He had radiographic studies at an outside facility that showed no acute traumatic injuries and he was discharged.  When he came home his daughter noticed he was extremely weak as she was trying to get him to the bathroom when he fell.  She was unable to hold or assist him at home.  The patient was feeling sore all over.  There were no specific localized neurological deficits.  He did not have any recurrent head injury.  No nausea or vomiting or diarrhea.  No dysuria, oliguria or hematuria or flank pain.  His work-up was initially unremarkable and the patient has been staying in the ER over the last 48 hours awaiting case management evaluation and placement.  He developed a fever of one 1.4 tonight and delirium with aggressive behavior.  He was maintaining good pulse oximetry.  He was fairly confused during my interview and was only alert to his name.  He gets significantly agitated requiring Ativan Haldol and Versed.  It seems like Versed helped him  more.  He later on dropped his pulse oximetry to 87% on room air and it got up to 98% on 2 L of O2 by nasal cannula.   ED Course: Temperature was one 1.4 as above with heart rate of 104 and respiratory to 18 pulse ox 90 was 95% on 2 L O2 by nasal cannula.  Labs revealed borderline potassium of 3.5 and hyperglycemia of 220 with a BUN of 24 and creatinine 1.39 and albumin 3.2 AST 69 and total bili of 1.3 with troponin of 6.3.  Lactic acid was 1.6 and CBC showed anemia with hemoglobin 10 hematocrit 29.2 and macrocytosis and thrombocytopenia of 99.  Influenza antigens and COVID-19 PCR came back negative.  Blood cultures were drawn.  EKG as reviewed by me : EKG ordered showed sinus rhythm with a rate of 96 with PVCs and PACs and Q waves anteroseptally as well as inferiorly Imaging: Portable chest x-ray showed no acute cardiopulmonary disease abdominal pelvic CT scan revealed the following: 1. Mild to moderate severity bibasilar atelectasis and/or infiltrate. 2. Very small bilateral pleural effusions. 3. Cholelithiasis. 4. Sigmoid diverticulosis. 5. Aortic atherosclerosis.  The patient was given 1 g p.o. Tylenol, IV vancomycin and cefepime, 52m IM Haldol and 2.2 L of IV lactated Ringer.  He will be admitted to a medical monitored bed for further evaluation and management.  PAST MEDICAL HISTORY:   Past Medical History:  Diagnosis Date  . Anemia   . B12  deficiency   . Basal cell carcinoma 03/30/2018   left ant nasal ala  . Basal cell carcinoma of face 01/19/2013   right distal dorsum nose  . BPH (benign prostatic hypertrophy)    followed by urology, discharged (Dr. Bernardo Heater)  . CAP (community acquired pneumonia) 02/15/2015  . CKD stage 3 due to type 2 diabetes mellitus ([place]) 11/14/2017  . Colon polyps   . Diverticulosis   . Dysplastic nevus 10/27/2006   right med calf  . GERD (gastroesophageal reflux disease)   . 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 History of pneumonia 2000's   "once" (07/07/2012)  . History of shingles   . HLD (hyperlipidemia)   . Hypertension   . Pneumonia   . Shortness of breath dyspnea   . Squamous cell carcinoma in situ 07/05/2020   Left lat. pretibial. EDC 09/07/2020  . Squamous cell carcinoma in situ 07/05/2020   Right medial pretibial. EDC 09/07/2020  . Squamous cell carcinoma of skin 04/03/2020   Left cheek. WD SCC  . Systolic murmur 72/04/4708  . Type 2 diabetes, controlled, with retinopathy ([place])     PAST SURGICAL HISTORY:   Past Surgical History:  Procedure Laterality Date  . [place] VITRECTOMY WITH 20 GAUGE MVR PORT FOR MACULAR HOLE  07/07/2012   Procedure: 25 GAUGE PARS PLANA VITRECTOMY WITH 20 GAUGE MVR PORT FOR MACULAR HOLE;  Surgeon: Hayden Pedro, MD;  Location: [facility];  Service: Ophthalmology;  Laterality: Left;  . BONE MARROW BIOPSY  2016  . CARDIOVASCULAR STRESS TEST  2013   treadmill - no evidence ischemia, EF 61%  . CATARACT EXTRACTION W/ INTRAOCULAR LENS  IMPLANT, BILATERAL  ~ 2010  . COLONOSCOPY  2014   [place] WNL no rpt needed, h/o polyps  . EYE SURGERY Left 06/2012   laser surgery  . GAS INSERTION  07/07/2012   Procedure: INSERTION OF GAS;  Surgeon: Hayden Pedro, MD;  Location: [facility];  Service: Ophthalmology;  Laterality: Left;  [id number]  . PERIPHERAL VASCULAR CATHETERIZATION N/A 02/23/2015   Procedure: Glori Luis Cath Insertion;  Surgeon: Algernon Huxley, MD;  Location: [facility];  Service: Cardiovascular;  Laterality: N/A;  . PORTA CATH REMOVAL N/A 02/16/2020   Procedure: PORTA CATH REMOVAL;  Surgeon: Algernon Huxley, MD;  Location: [facility];  Service: Cardiovascular;  Laterality: N/A;  . SERUM PATCH  07/07/2012   Procedure: SERUM PATCH;  Surgeon: Hayden Pedro, MD;  Location: [facility];  Service: Ophthalmology;  Laterality: Left;  . SKIN CANCER EXCISION     "all over my face" (07/07/2012)    SOCIAL HISTORY:   Social History    Tobacco Use  . Smoking status: Former    Packs/day: 2.00    Years: 25.00    Pack years: 50.00    Types: Cigarettes    Quit date: 02/06/1969    Years since quitting: 52.2  . Smokeless tobacco: Never  . Tobacco comments:    07/07/2012 "stopped smoking ~ 40 yr ago; smoked 20-97yr  Substance Use Topics  . Alcohol use: Yes    Alcohol/week: 0.0 standard drinks    Comment: rare    FAMILY HISTORY:   Family History  Problem Relation Age of Onset  . Dementia Mother   . Heart failure Father 757 . Cancer Sister        breast  . Diabetes Paternal Uncle   .  Diabetes Paternal Aunt   . CAD Brother 39       MI  . Stroke Neg Hx     DRUG ALLERGIES:   Allergies  Allergen Reactions  . Rituximab Rash    Chest tightness Chest tightness  . Blood-Group Specific Substance Other (See Comments)    Had a post transfusion reaction of red blood cells; NOW REQUIRES WASHED BLOOD CELLS Had a post transfusion reaction of red blood cells; NOW REQUIRES WASHED BLOOD CELLS  . Primaxin [Imipenem] Other (See Comments)    Possible allergy Possible allergy  . Voriconazole Other (See Comments)  . Sulfa Antibiotics Itching and Rash  . Sulfacetamide Sodium Itching and Rash    REVIEW OF SYSTEMS:   ROS As per history of present illness. All pertinent systems were reviewed above. Constitutional, HEENT, cardiovascular, respiratory, GI, GU, musculoskeletal, neuro, psychiatric, endocrine, integumentary and hematologic systems were reviewed and are otherwise negative/unremarkable except for positive findings mentioned above in the HPI.   MEDICATIONS AT HOME:   Prior to Admission medications   Medication Sig Start Date End Date Taking? Authorizing Provider  aspirin EC 81 MG tablet Take 81 mg by mouth daily.   Yes [provider]  ciprofloxacin (CIPRO) 750 MG tablet Take 750 mg by mouth 2 (two) times daily. 03/08/21  Yes [provider]  DIPHENHYDRAMINE-ACETAMINOPHEN PO Take 1 tablet by  mouth at bedtime.   Yes [provider]  doxazosin (CARDURA) 1 MG tablet TAKE ONE TABLET BY MOUTH EVERY DAY 08/31/19  Yes Ria Bush, MD  famotidine (PEPCID) 40 MG tablet Take 0.5 tablets (20 mg total) by mouth daily. NEEDS OFFICE VISIT 07/16/20  Yes Ria Bush, MD  HUMULIN N 100 UNIT/ML injection INJECT 39 UNITS SUBCUTANEOUSLY TWICE DAILY BEFORE A MEAL Patient taking differently: Inject 1 Units into the skin as directed. Pt uses this as sliding scale 06/23/20  Yes Ria Bush, MD  HUMULIN R 100 UNIT/ML injection INJECT 10 UNITS INTO THE SKIN TWICE DAILY WITH MEALS 11/09/19  Yes Ria Bush, MD  lisinopril (ZESTRIL) 20 MG tablet TAKE ONE TABLET BY MOUTH TWICE DAILY 03/01/20  Yes Ria Bush, MD  omeprazole (PRILOSEC) 20 MG capsule Take 1 capsule by mouth 1 day or 1 dose.   Yes [provider]  pravastatin (PRAVACHOL) 20 MG tablet TAKE ONE TABLET EVERY EVENING 03/16/20  Yes Ria Bush, MD  tamsulosin (FLOMAX) 0.4 MG CAPS capsule TAKE 1 CAPSULE BY MOUTH EVERY DAY 12/11/17  Yes Ria Bush, MD  traZODone (DESYREL) 50 MG tablet TAKE 1/2 TO 1 TABLET BY MOUTH AT [facility] NEEDED FOR SLEEP 01/23/17  Yes Ria Bush, MD  vitamin B-12 (CYANOCOBALAMIN) 1000 MCG tablet Take 1 tablet (1,000 mcg total) by mouth daily. 06/01/19  Yes Ria Bush, MD  vitamin E 400 UNIT capsule Take 200 Units by mouth daily.    Yes [provider]  glucose blood (ACCU-CHEK AVIVA PLUS) test strip USE TO CHECK SUGAR DAILY E11.319 08/28/18   Ria Bush, MD  hydrochlorothiazide (HYDRODIURIL) 25 MG tablet TAKE 1 TABLET BY MOUTH DAILY Patient not taking: No sig reported 11/09/19   Ria Bush, MD  Microlet Lancets MISC USE AS DIRECTED 10/28/19   Ria Bush, MD  temazepam (RESTORIL) 7.5 MG capsule Take 1 capsule by mouth daily. Patient not taking: No sig reported 03/03/15   [provider]  [place] X 5/16" 0.5 ML  MISC See admin instructions. 01/24/20   [provider]      VITAL SIGNS:  Blood pressure 133/61, pulse 88, temperature (!) 101.4 F (38.6 C), temperature source Rectal, resp. rate 18, height _0  (1.702 m), weight 113.4 kg, SpO2 98 %.  PHYSICAL EXAMINATION:  Physical Exam  GENERAL:  86 y.o.-year-old Caucasian male patient lying in the bed with no acute distress.  EYES: Pupils equal, round, reactive to light and accommodation. No scleral icterus. Extraocular muscles intact.  HEENT: Head atraumatic, normocephalic. Oropharynx and nasopharynx clear.  NECK:  Supple, no jugular venous distention. No thyroid enlargement, no tenderness.  LUNGS: Slightly diminished bibasilar breath sounds with bibasal crackles.  CARDIOVASCULAR: Regular rate and rhythm, S1, S2 normal. No murmurs, rubs, or gallops.  ABDOMEN: Soft, nondistended, nontender. Bowel sounds present. No organomegaly or mass.  EXTREMITIES: No pedal edema, cyanosis, or clubbing.  NEUROLOGIC: Cranial nerves II through XII are intact. Muscle strength 5/5 in all extremities. Sensation intact. Gait not checked.  PSYCHIATRIC: The patient is alert and oriented x 3.  Normal affect and good eye contact. SKIN: No obvious rash, lesion, or ulcer.   LABORATORY PANEL:   CBC Recent Labs  Lab 05/01/21 2244  WBC 4.8  HGB 10.0*  HCT 29.2*  PLT 99*   ------------------------------------------------------------------------------------------------------------------  Chemistries  Recent Labs  Lab 05/01/21 2244  NA 135  K 3.5  CL 103  CO2 24  GLUCOSE 220*  BUN 24*  CREATININE 1.39*  CALCIUM 8.3*  AST 69*  ALT 26  ALKPHOS 59  BILITOT 1.3*   ------------------------------------------------------------------------------------------------------------------  Cardiac Enzymes No results for input(s): TROPONINI in the last 168  hours. ------------------------------------------------------------------------------------------------------------------  RADIOLOGY:  CT Head Wo Contrast  Result Date: 05/01/2021 CLINICAL DATA:  Altered mental status EXAM: CT HEAD WITHOUT CONTRAST TECHNIQUE: Contiguous axial images were obtained from the base of the skull through the vertex without intravenous contrast. COMPARISON:  Brain MRI 04/30/2021, CT head 04/30/2021 FINDINGS: Brain: There is no acute intracranial hemorrhage, extra-axial fluid collection, or infarct. The ventricles are stable in size. Mild chronic white matter microangiopathic change is stable. There is no mass lesion. There is no midline shift. Vascular: There is calcification of the bilateral cavernous ICAs. Skull: Normal. Negative for fracture or focal lesion. Sinuses/Orbits: The imaged paranasal sinuses are clear. Bilateral lens implants are in place. The globes and orbits are otherwise unremarkable. Other: None. IMPRESSION: Stable appearance of the brain with no acute intracranial pathology. Electronically Signed   By: Valetta Mole M.D.   On: 05/01/2021 10:18   CT HEAD WO CONTRAST (5MM)  Result Date: 04/30/2021 CLINICAL DATA:  Neuro deficit. EXAM: CT HEAD WITHOUT CONTRAST TECHNIQUE: Contiguous axial images were obtained from the base of the skull through the vertex without intravenous contrast. COMPARISON:  MR head 04/30/2021 BRAIN: BRAIN Patchy and confluent areas of decreased attenuation are noted throughout the deep and periventricular white matter of the cerebral hemispheres bilaterally, compatible with chronic microvascular ischemic disease. No evidence of large-territorial acute infarction. No parenchymal hemorrhage. No mass lesion. No extra-axial collection. No mass effect or midline shift. No hydrocephalus. Basilar cisterns are patent. Vascular: No hyperdense vessel. Atherosclerotic calcifications are present within the cavernous internal carotid arteries. Skull: No  acute fracture or focal lesion. Sinuses/Orbits: Paranasal sinuses and mastoid air cells are clear. Bilateral lens replacement. The orbits are unremarkable. Other: None. IMPRESSION: No acute intracranial abnormality. Electronically Signed   By: Iven Finn M.D.   On: 04/30/2021 17:59   MR BRAIN WO CONTRAST  Result Date: 04/30/2021 CLINICAL DATA:  Neuro deficit, acute, stroke suspected EXAM: MRI HEAD WITHOUT CONTRAST TECHNIQUE: Multiplanar,  multiecho pulse sequences of the brain and surrounding structures were obtained without intravenous contrast. COMPARISON:  None. FINDINGS: Brain: There is curvilinear susceptibility in the right frontal lobe contiguous with a cortical vein (series 13, images 39-41). Subtle corresponding T2 FLAIR hyperintensity. There is no acute infarction. There is no intracranial mass or mass effect. There is no hydrocephalus or extra-axial fluid collection. Prominence of the ventricles and sulci reflects generalized parenchymal loss. Patchy T2 hyperintensity in the supratentorial white matter is nonspecific but may reflect underlying microvascular ischemic changes. Vascular: Major vessel flow voids at the skull base are preserved. Skull and upper cervical spine: Normal marrow signal is preserved. Sinuses/Orbits: Mild mucosal thickening. Bilateral lens replacements. Other: Sella is unremarkable. Patchy right mastoid fluid opacification. IMPRESSION: Curvilinear possible abnormal signal in the right frontal lobe contiguous with a cortical vein. Considerations include minimal subarachnoid hemorrhage and cortical vein thrombosis. Head CT may be helpful. Electronically Signed   By: Macy Mis M.D.   On: 04/30/2021 11:34   CT ABDOMEN PELVIS W CONTRAST  Result Date: 05/02/2021 CLINICAL DATA:  Abdominal pain and weakness. EXAM: CT ABDOMEN AND PELVIS WITH CONTRAST TECHNIQUE: Multidetector CT imaging of the abdomen and pelvis was performed using the standard protocol following bolus  administration of intravenous contrast. CONTRAST:  189m OMNIPAQUE IOHEXOL 350 MG/ML SOLN COMPARISON:  April 04, 2017 FINDINGS: Lower chest: Mild to moderate severity areas of atelectasis and/or infiltrate are seen within the bilateral lung bases. Very small bilateral pleural effusions are noted. A very small anterior pericardial effusion is noted. Hepatobiliary: No focal liver abnormality is seen. Numerous small gallstones are seen within the gallbladder lumen without evidence of gallbladder wall thickening or biliary dilatation. Pancreas: Diffuse pancreatic atrophy is noted without evidence of pancreatic duct dilatation. Spleen: Numerous punctate calcified granulomas are seen scattered throughout the spleen. Adrenals/Urinary Tract: Adrenal glands are unremarkable. Kidneys are normal in size, without renal calculi, focal or hydronephrosis. A 3.8 cm x 2.5 cm simple cyst is seen along the posteromedial aspect of the mid left kidney. Additional smaller simple renal cysts are seen bilaterally. Bladder is unremarkable. Stomach/Bowel: There is a small hiatal hernia. Appendix appears normal. No evidence of bowel wall thickening, distention, or inflammatory changes. Noninflamed diverticula are seen throughout the sigmoid colon. Vascular/Lymphatic: Aortic atherosclerosis. No enlarged abdominal or pelvic lymph nodes. Reproductive: There is mild prostate gland enlargement with moderate severity prostate calcification. Other: No abdominal wall hernia or abnormality. No abdominopelvic ascites. Musculoskeletal: Degenerative changes are seen throughout the lumbar spine. IMPRESSION: 1. Mild to moderate severity bibasilar atelectasis and/or infiltrate. 2. Very small bilateral pleural effusions. 3. Cholelithiasis. 4. Sigmoid diverticulosis. 5. Aortic atherosclerosis. Aortic Atherosclerosis (ICD10-I70.0). Electronically Signed   By: TVirgina NorfolkM.D.   On: 05/02/2021 00:20   DG Chest Port 1 View  Result Date:  05/01/2021 CLINICAL DATA:  [place] weakness EXAM: PORTABLE CHEST 1 VIEW COMPARISON:  01/31/2015 FINDINGS: Single frontal view of the chest demonstrates an unremarkable cardiac silhouette. Chronic scarring at the lung bases without airspace disease, effusion, or pneumothorax. No acute bony abnormalities. IMPRESSION: 1. Stable chest, no acute process. Electronically Signed   By: MRanda NgoM.D.   On: 05/01/2021 23:08      IMPRESSION AND PLAN:  Active Problems:   CAP (community acquired pneumonia)  1.  Bibasal community-acquired pneumonia with associated generalized weakness and recent fall.  The patient has subsequent mild sepsis as manifested by tachycardia and tachypnea as well as fever. - The patient be admitted to a medical monitored bed. -  Continue antibiotic therapy with IV Rocephin and Zithromax. - Mucolytic therapy will be provided. - DuoNebs will be given as needed. - We will follow blood and sputum culture. - She will be hydrated with IV normal saline.  2.  Delirium. - This could be associated with developing dementia and exacerbated by #1. - We will utilize as needed IM Haldol and Benadryl IM for severe agitation with May use fentanyl.  3.  Type 2 diabetes mellitus. - We will place him on supplement coverage with NovoLog and continue basal coverage.  4.  BPH. - We will continue Flomax.  5.  GERD. - We will continue PPI therapy.  6.  Essential hypertension. - We will continue his antihypertensives including Zestril and HCTZ.  7.  Stage IIIa chronic kidney disease, stable with improving BUN and creatinine. - We will monitor BMP.  DVT prophylaxis: Lovenox.  Code Status: full code.  Family Communication:  The plan of care was discussed in details with the patient (and family). I answered all questions. The patient agreed to proceed with the above mentioned plan. Further management will depend upon hospital course. Disposition Plan: Back to previous home  environment Consults called: none. All the records are reviewed and case discussed with ED provider.  Status is: Inpatient  Remains inpatient appropriate because:Ongoing diagnostic testing needed not appropriate for outpatient work up, Unsafe d/c plan, IV treatments appropriate due to intensity of illness or inability to take PO, and Inpatient level of care appropriate due to severity of illness  Dispo: The patient is from: Home              Anticipated d/c is to: Home              Patient currently is not medically stable to d/c.   Difficult to place patient No   TOTAL TIME TAKING CARE OF THIS PATIENT: 55 minutes.    Christel Mormon M.D on 05/02/2021 at 5:06 AM  [facility]   From 7 PM-7 AM, contact night-coverage [email or web address]  CC: Primary care physician; Leonel Ramsay, MD

## 2021-05-03 DIAGNOSIS — J189 Pneumonia, unspecified organism: Secondary | ICD-10-CM | POA: Diagnosis not present

## 2021-05-03 LAB — CBG MONITORING, ED
Glucose-Capillary: 273 mg/dL — ABNORMAL HIGH (ref 70–99)
Glucose-Capillary: 259 mg/dL — ABNORMAL HIGH (ref 70–99)
Glucose-Capillary: 287 mg/dL — ABNORMAL HIGH (ref 70–99)
Glucose-Capillary: 225 mg/dL — ABNORMAL HIGH (ref 70–99)

## 2021-05-03 MED ORDER — NYSTATIN 100000 UNIT/GM EX POWD
Freq: Two times a day (BID) | CUTANEOUS | Status: DC
  Filled 2021-05-03 (×3): qty 15

## 2021-05-03 MED ORDER — HALOPERIDOL LACTATE 5 MG/ML IJ SOLN
5.0000 mg | Freq: Four times a day (QID) | INTRAMUSCULAR | Status: DC | PRN
  Administered 2021-05-03 – 2021-05-05 (×2): 5 mg via INTRAMUSCULAR
  Filled 2021-05-03 (×3): qty 1

## 2021-05-03 MED ORDER — QUETIAPINE FUMARATE 25 MG PO TABS
25.0000 mg | ORAL_TABLET | Freq: Every day | ORAL | Status: DC
  Administered 2021-05-04 – 2021-05-07 (×4): 25 mg via ORAL
  Filled 2021-05-03 (×4): qty 1

## 2021-05-03 MED ORDER — LISINOPRIL 10 MG PO TABS
10.0000 mg | ORAL_TABLET | Freq: Two times a day (BID) | ORAL | Status: DC
  Administered 2021-05-03 – 2021-05-11 (×16): 10 mg via ORAL
  Filled 2021-05-03 (×3): qty 1
  Filled 2021-05-03: qty 2
  Filled 2021-05-03 (×12): qty 1

## 2021-05-03 MED ORDER — HALOPERIDOL 0.5 MG PO TABS
0.5000 mg | ORAL_TABLET | Freq: Two times a day (BID) | ORAL | Status: DC
  Administered 2021-05-03: 0.5 mg via ORAL
  Filled 2021-05-03 (×3): qty 1

## 2021-05-03 MED ORDER — INSULIN NPH (HUMAN) (ISOPHANE) 100 UNIT/ML ~~LOC~~ SUSP
15.0000 [IU] | Freq: Two times a day (BID) | SUBCUTANEOUS | Status: DC
  Administered 2021-05-03: 15 [IU] via SUBCUTANEOUS
  Filled 2021-05-03: qty 10

## 2021-05-03 MED ORDER — HALOPERIDOL LACTATE 5 MG/ML IJ SOLN
5.0000 mg | Freq: Once | INTRAMUSCULAR | Status: AC
  Administered 2021-05-03: 5 mg via INTRAVENOUS

## 2021-05-03 NOTE — ED Notes (Signed)
Pt continues to pull at lines and cords and take off gown. Frequent calls from telesitter. Pt repositioned and given verbal reassurance.

## 2021-05-03 NOTE — ED Notes (Signed)
Pt pulling off safety mitts, condom cath, and blankets, Mitts and external catheter placed back on pt.

## 2021-05-03 NOTE — ED Notes (Signed)
Meds given again.  Pt moving about on bed trying to crawl out.  Pt has mitts on , swinging at staff and kicking.  Telesitter and nurse in with pt sitting.

## 2021-05-03 NOTE — ED Notes (Signed)
Pt confused, meds given.  Tele sitter in place.  Siderails up

## 2021-05-03 NOTE — ED Notes (Signed)
Pt set up for breakfast, feeding self with minimal assist from son.

## 2021-05-03 NOTE — ED Notes (Signed)
Pt given break from safety mitts, pt not pulling at lines at this time.

## 2021-05-03 NOTE — ED Notes (Addendum)
Pt still trying to get OOB, frequent telsitter calls. Pt repositioned. Mitts taken off for 20 minutes and reapplied due to pt pulling at lines and condom cath.

## 2021-05-03 NOTE — ED Notes (Signed)
Pt set up for dinner, son at bedside. Pt eating 50 % of meal. Pt still attempting to pull of gown and cords while son at bedside, MD notified.

## 2021-05-03 NOTE — ED Notes (Addendum)
Son at bedside, pt calm at present. Pt placed on bedpan to attempt BM, no results.

## 2021-05-03 NOTE — ED Notes (Signed)
Meds infusing.  Pt more cooperative now.  Mitts on hands.  Telesitter with pt

## 2021-05-03 NOTE — ED Notes (Signed)
Bg [phone]

## 2021-05-03 NOTE — ED Notes (Signed)
Pt continuing to pull at lines and cords and trying to get OOB. Pt repositioned, condom cath reapplied.

## 2021-05-03 NOTE — ED Notes (Signed)
New external catheter applied, pt with reddened scrotum, MD notified.

## 2021-05-03 NOTE — ED Notes (Signed)
Pt ate 90% of breakfast, tolerated well

## 2021-05-03 NOTE — ED Notes (Signed)
Fsbs 225

## 2021-05-03 NOTE — ED Notes (Signed)
Pt attempting to get OOB, monitors and gown off. Pt repositioned in bed, gown and monitors replaced. Hand mitts placed for pt's safety, as pt continues to take monitors off and scratching scrotum.

## 2021-05-03 NOTE — ED Notes (Signed)
Pt sliding down in bed to get out, pt reoriented and given verbal reassurance. Pt moved back up in bed.

## 2021-05-03 NOTE — Progress Notes (Addendum)
Progress Note    [name]  [id number] DOB: [birth year]  DOA: 04/30/2021 PCP: Leonel Ramsay, MD      Brief Narrative:    Medical records reviewed and are as summarized below:  [name] is a 86 y.o. male with medical history including but not limited to recent accidental/mechanical fall in [place] on 04/29/2021, hypertension, insulin-dependent diabetes mellitus type 2, morbid obesity, BPH, GERD, CKD stage III hyperlipidemia, history of pneumonia, history of squamous cell carcinoma in situ, history of basal cell carcinoma of face, vitamin B12 deficiency, colonic polyps.  He presented to [facility] because of generalized weakness, another mechanical fall at home and generalized body pain.  He was febrile in the emergency room with temperature of 101.4 F.  CT abdomen pelvis showed bibasilar infiltrates concerning for pneumonia.  He was admitted to the hospital for severe sepsis secondary to community-acquired pneumonia complicated by acute metabolic encephalopathy.    Assessment/Plan:   Active Problems:   CAP (community acquired pneumonia)    Body mass index is 39.16 kg/m.  (Morbid obesity): This complicates overall care and prognosis   Severe sepsis secondary to community-acquired bilateral lower lobe pneumonia: Continue empiric IV antibiotics.  Follow-up blood cultures.  Severe sepsis as evidenced by fever (101.4), tachycardia (HR 99) and confusion/altered mental status.  Delirium/acute metabolic encephalopathy: No acute abnormality on CT head or MRI brain.  Start low-dose Seroquel.  IM Haldol as needed for agitation.  Continue supportive care.  Type II DM with hyperglycemia: Patient takes Humulin N 39 units twice daily at home.  Restart Humulin N at 15 units twice daily.  Adjust dose based on glucose levels.  Continue NovoLog as needed for hyperglycemia.  Hypertension: BP is uncontrolled.  Restart home lisinopril.  CKD stage IIIa: Creatinine is  stable  S/p recent fall on 04/29/2021: He still has sutures in the right hand.  PT and OT evaluation.    Diet Order             Diet Heart Room service appropriate? Yes; Fluid consistency: Thin  Diet effective now                      Consultants: None  Procedures: None    Medications:    aspirin EC  81 mg Oral Daily   diphenhydrAMINE  12.5 mg Oral QHS   enoxaparin (LOVENOX) injection  55 mg Subcutaneous Daily   guaiFENesin  600 mg Oral BID   haloperidol  0.5 mg Oral BID   insulin aspart  0-15 Units Subcutaneous TID WC   insulin aspart  0-5 Units Subcutaneous QHS   nystatin   Topical BID   pantoprazole  40 mg Oral Daily   pravastatin  20 mg Oral QPM   tamsulosin  0.4 mg Oral Daily   Tbo-filgastrim (GRANIX) SQ  480 mcg Subcutaneous Once   vitamin B-12  1,000 mcg Oral Daily   vitamin E  200 Units Oral Daily   Continuous Infusions:  azithromycin Stopped (05/03/21 0517)   cefTRIAXone (ROCEPHIN)  IV Stopped (05/03/21 0000)   diphenhydrAMINE Stopped (05/03/21 0420)     Anti-infectives (From admission, onward)    Start     Dose/Rate Route Frequency Ordered Stop   05/02/21 2200  cefTRIAXone (ROCEPHIN) 2 g in sodium chloride 0.9 % 100 mL IVPB  Status:  Discontinued        2 g 200 mL/hr over 30 Minutes Intravenous Every 24 hours 05/02/21 0242 05/02/21  0246   05/02/21 2200  cefTRIAXone (ROCEPHIN) 2 g in sodium chloride 0.9 % 100 mL IVPB        2 g 200 mL/hr over 30 Minutes Intravenous Every 24 hours 05/02/21 0246 05/06/21 2159   05/02/21 0400  azithromycin (ZITHROMAX) 500 mg in sodium chloride 0.9 % 250 mL IVPB        500 mg 250 mL/hr over 60 Minutes Intravenous Every 24 hours 05/02/21 0242 05/07/21 0359   05/02/21 0030  vancomycin (VANCOCIN) IVPB 1000 mg/200 mL premix        1,000 mg 200 mL/hr over 60 Minutes Intravenous  Once 05/02/21 0027 05/02/21 0243   05/02/21 0030  ceFEPIme (MAXIPIME) 2 g in sodium chloride 0.9 % 100 mL IVPB        2 g 200 mL/hr over  30 Minutes Intravenous  Once 05/02/21 0027 05/02/21 0134   05/01/21 2245  cefTRIAXone (ROCEPHIN) 2 g in sodium chloride 0.9 % 100 mL IVPB  Status:  Discontinued        2 g 200 mL/hr over 30 Minutes Intravenous Every 24 hours 05/01/21 2236 05/02/21 0245              Family Communication/Anticipated D/C date and plan/Code Status   DVT prophylaxis:      Code Status: Full Code  Family Communication: Plan discussed with Doug, son, at the bedside Disposition Plan:    Status is: Inpatient  Remains inpatient appropriate because:IV treatments appropriate due to intensity of illness or inability to take PO and Inpatient level of care appropriate due to severity of illness  Dispo: The patient is from: Home              Anticipated d/c is to: Home              Patient currently is not medically stable to d/c.   Difficult to place patient No           Subjective:   Interval events noted.  He said he feels better and has no complaints.  His son Marden Noble) was at the bedside.  Objective:    Vitals:   05/03/21 0800 05/03/21 0816 05/03/21 1100 05/03/21 1155  BP:  (!) 129/51 (!) 141/75 (!) 162/53  Pulse: (!) 110 88 (!) 107 (!) 107  Resp:  18  18  Temp:  98.1 F (36.7 C)    TempSrc:  Oral    SpO2: 95% 94% 100% 97%  Weight:      Height:       No data found.  No intake or output data in the 24 hours ending 05/03/21 1204 Filed Weights   04/29/21 2316  Weight: 113.4 kg    Exam:  GEN: NAD SKIN: Bruises on the left side of the face.  Erythematous area on the scrotum. EYES: EOMI ENT: MMM CV: RRR PULM: CTA B ABD: soft, obese, NT, +BS CNS: AAO x 1 (person), non focal EXT: No edema or tenderness.  Sutures on palmar aspect of the right third finger PIP joint and thumb interphalangeal joint        Data Reviewed:   I have personally reviewed following labs and imaging studies:  Labs: Labs show the following:   Basic Metabolic Panel: Recent Labs  Lab  04/27/21 1501 04/29/21 2328 05/01/21 2244 05/02/21 0648  NA 138 137 135 135  K 3.8 4.0 3.5 4.1  CL 107 107 103 104  CO2 '25 22 24 25  '$ GLUCOSE 310* 228* 220* 230*  BUN 21 24* 24* 23  CREATININE 1.44* 1.48* 1.39* 1.34*  CALCIUM 8.7* 8.9 8.3* 8.3*   GFR Estimated Creatinine Clearance: 49.3 mL/min (A) (by C-G formula based on SCr of 1.34 mg/dL (H)). Liver Function Tests: Recent Labs  Lab 04/27/21 1501 04/29/21 2328 05/01/21 2244  AST 23 30 69*  ALT '16 18 26  '$ ALKPHOS 74 72 59  BILITOT 1.1 1.6* 1.3*  PROT 7.8 7.7 6.3*  ALBUMIN 4.0 4.0 3.2*   No results for input(s): LIPASE, AMYLASE in the last 168 hours. No results for input(s): AMMONIA in the last 168 hours. Coagulation profile Recent Labs  Lab 05/01/21 2244 05/02/21 0648  INR 1.1 1.1    CBC: Recent Labs  Lab 04/27/21 1501 04/29/21 2328 05/01/21 2244 05/02/21 0648  WBC 4.1 4.8 4.8 4.1  NEUTROABS 2.5  --  3.7  --   HGB 10.6* 11.0* 10.0* 10.7*  HCT 31.7* 31.9* 29.2* 30.5*  MCV 106.4* 105.3* 104.3* 103.7*  PLT 109* 113* 99* 93*   Cardiac Enzymes: No results for input(s): CKTOTAL, CKMB, CKMBINDEX, TROPONINI in the last 168 hours. BNP (last 3 results) No results for input(s): PROBNP in the last 8760 hours. CBG: Recent Labs  Lab 05/01/21 2104 05/02/21 0731 05/02/21 1751 05/03/21 0733 05/03/21 1158  GLUCAP 231* 216* 236* 273* 259*   D-Dimer: No results for input(s): DDIMER in the last 72 hours. Hgb A1c: No results for input(s): HGBA1C in the last 72 hours. Lipid Profile: No results for input(s): CHOL, HDL, LDLCALC, TRIG, CHOLHDL, LDLDIRECT in the last 72 hours. Thyroid function studies: No results for input(s): TSH, T4TOTAL, T3FREE, THYROIDAB in the last 72 hours.  Invalid input(s): FREET3 Anemia work up: No results for input(s): VITAMINB12, FOLATE, FERRITIN, TIBC, IRON, RETICCTPCT in the last 72 hours. Sepsis Labs: Recent Labs  Lab 04/27/21 1501 04/29/21 2328 05/01/21 2244 05/02/21 0648   PROCALCITON  --   --   --  0.22  WBC 4.1 4.8 4.8 4.1  LATICACIDVEN  --   --  1.6 1.4    Microbiology Recent Results (from the past 240 hour(s))  Resp Panel by RT-PCR (Flu A&B, Covid) Nasopharyngeal Swab     Status: None   Collection Time: 04/30/21  1:54 AM   Specimen: Nasopharyngeal Swab; Nasopharyngeal(NP) swabs in vial transport medium  Result Value Ref Range Status   SARS Coronavirus 2 by RT PCR NEGATIVE NEGATIVE Final    Comment: (NOTE) SARS-CoV-2 target nucleic acids are NOT DETECTED.  The SARS-CoV-2 RNA is generally detectable in upper respiratory specimens during the acute phase of infection. The lowest concentration of SARS-CoV-2 viral copies this assay can detect is 138 copies/mL. A negative result does not preclude SARS-Cov-2 infection and should not be used as the sole basis for treatment or other patient management decisions. A negative result may occur with  improper specimen collection/handling, submission of specimen other than nasopharyngeal swab, presence of viral mutation(s) within the areas targeted by this assay, and inadequate number of viral copies(<138 copies/mL). A negative result must be combined with clinical observations, patient history, and epidemiological information. The expected result is Negative.  Fact Sheet for Patients:  EntrepreneurPulse.com.au  Fact Sheet for Healthcare Providers:  IncredibleEmployment.be  This test is no t yet approved or cleared by the [place] FDA and  has been authorized for detection and/or diagnosis of SARS-CoV-2 by FDA under an Emergency Use Authorization (EUA). This EUA will remain  in effect (meaning this test can be used) for the duration of the COVID-19  declaration under Section 564(b)(1) of the Act, 21 U.S.C.section 360bbb-3(b)(1), unless the authorization is terminated  or revoked sooner.       Influenza A by PCR NEGATIVE NEGATIVE Final   Influenza B by PCR  NEGATIVE NEGATIVE Final    Comment: (NOTE) The Xpert Xpress SARS-CoV-2/FLU/RSV plus assay is intended as an aid in the diagnosis of influenza from Nasopharyngeal swab specimens and should not be used as a sole basis for treatment. Nasal washings and aspirates are unacceptable for Xpert Xpress SARS-CoV-2/FLU/RSV testing.  Fact Sheet for Patients: EntrepreneurPulse.com.au  Fact Sheet for Healthcare Providers: IncredibleEmployment.be  This test is not yet approved or cleared by the [place] FDA and has been authorized for detection and/or diagnosis of SARS-CoV-2 by FDA under an Emergency Use Authorization (EUA). This EUA will remain in effect (meaning this test can be used) for the duration of the COVID-19 declaration under Section 564(b)(1) of the Act, 21 U.S.C. section 360bbb-3(b)(1), unless the authorization is terminated or revoked.  Performed at [facility], [address], [place], [place] [address]   Urine Culture     Status: Abnormal (Preliminary result)   Collection Time: 05/01/21 10:48 AM   Specimen: Urine, Random  Result Value Ref Range Status   Specimen Description   Final    URINE, RANDOM Performed at [facility], [address], [place], [place] [address]    Special Requests   Final    NONE Performed at [facility], [address] [place], [place] [address]    Culture (A)  Final    50,000 COLONIES/mL GRAM NEGATIVE RODS SUSCEPTIBILITIES TO FOLLOW Performed at [facility], [place] [address] [place], [place] [address]    Report Status PENDING  Incomplete  Blood Culture (routine x 2)     Status: None (Preliminary result)   Collection Time: 05/01/21 10:44 PM   Specimen: BLOOD  Result Value Ref Range Status   Specimen Description BLOOD LEFT WRIST  Final   Special Requests   Final    BOTTLES DRAWN AEROBIC AND ANAEROBIC Blood Culture adequate volume   Culture   Final    NO GROWTH 2  DAYS Performed at [facility], [address], [place], [place] [address]    Report Status PENDING  Incomplete  Blood Culture (routine x 2)     Status: None (Preliminary result)   Collection Time: 05/01/21 10:45 PM   Specimen: BLOOD  Result Value Ref Range Status   Specimen Description BLOOD RIGHT FOREARM  Final   Special Requests   Final    BOTTLES DRAWN AEROBIC AND ANAEROBIC Blood Culture adequate volume   Culture   Final    NO GROWTH 2 DAYS Performed at [facility], [address] [place], [place] [address]    Report Status PENDING  Incomplete  Resp Panel by RT-PCR (Flu A&B, Covid) Nasopharyngeal Swab     Status: None   Collection Time: 05/02/21 12:20 AM   Specimen: Nasopharyngeal Swab; Nasopharyngeal(NP) swabs in vial transport medium  Result Value Ref Range Status   SARS Coronavirus 2 by RT PCR NEGATIVE NEGATIVE Final    Comment: (NOTE) SARS-CoV-2 target nucleic acids are NOT DETECTED.  The SARS-CoV-2 RNA is generally detectable in upper respiratory specimens during the acute phase of infection. The lowest concentration of SARS-CoV-2 viral copies this assay can detect is 138 copies/mL. A negative result does not preclude SARS-Cov-2 infection and should not be used as the sole basis for treatment or other  patient management decisions. A negative result may occur with  improper specimen collection/handling, submission of specimen other than nasopharyngeal swab, presence of viral mutation(s) within the areas targeted by this assay, and inadequate number of viral copies(<138 copies/mL). A negative result must be combined with clinical observations, patient history, and epidemiological information. The expected result is Negative.  Fact Sheet for Patients:  EntrepreneurPulse.com.au  Fact Sheet for Healthcare Providers:  IncredibleEmployment.be  This test is no t yet approved or cleared by the [place] FDA and   has been authorized for detection and/or diagnosis of SARS-CoV-2 by FDA under an Emergency Use Authorization (EUA). This EUA will remain  in effect (meaning this test can be used) for the duration of the COVID-19 declaration under Section 564(b)(1) of the Act, 21 U.S.C.section 360bbb-3(b)(1), unless the authorization is terminated  or revoked sooner.       Influenza A by PCR NEGATIVE NEGATIVE Final   Influenza B by PCR NEGATIVE NEGATIVE Final    Comment: (NOTE) The Xpert Xpress SARS-CoV-2/FLU/RSV plus assay is intended as an aid in the diagnosis of influenza from Nasopharyngeal swab specimens and should not be used as a sole basis for treatment. Nasal washings and aspirates are unacceptable for Xpert Xpress SARS-CoV-2/FLU/RSV testing.  Fact Sheet for Patients: EntrepreneurPulse.com.au  Fact Sheet for Healthcare Providers: IncredibleEmployment.be  This test is not yet approved or cleared by the [place] FDA and has been authorized for detection and/or diagnosis of SARS-CoV-2 by FDA under an Emergency Use Authorization (EUA). This EUA will remain in effect (meaning this test can be used) for the duration of the COVID-19 declaration under Section 564(b)(1) of the Act, 21 U.S.C. section 360bbb-3(b)(1), unless the authorization is terminated or revoked.  Performed at [facility], [address] [place], [place] [address]     Procedures and diagnostic studies:  CT ABDOMEN PELVIS W CONTRAST  Result Date: 05/02/2021 CLINICAL DATA:  Abdominal pain and weakness. EXAM: CT ABDOMEN AND PELVIS WITH CONTRAST TECHNIQUE: Multidetector CT imaging of the abdomen and pelvis was performed using the standard protocol following bolus administration of intravenous contrast. CONTRAST:  186m OMNIPAQUE IOHEXOL 350 MG/ML SOLN COMPARISON:  April 04, 2017 FINDINGS: Lower chest: Mild to moderate severity areas of atelectasis and/or infiltrate are seen  within the bilateral lung bases. Very small bilateral pleural effusions are noted. A very small anterior pericardial effusion is noted. Hepatobiliary: No focal liver abnormality is seen. Numerous small gallstones are seen within the gallbladder lumen without evidence of gallbladder wall thickening or biliary dilatation. Pancreas: Diffuse pancreatic atrophy is noted without evidence of pancreatic duct dilatation. Spleen: Numerous punctate calcified granulomas are seen scattered throughout the spleen. Adrenals/Urinary Tract: Adrenal glands are unremarkable. Kidneys are normal in size, without renal calculi, focal or hydronephrosis. A 3.8 cm x 2.5 cm simple cyst is seen along the posteromedial aspect of the mid left kidney. Additional smaller simple renal cysts are seen bilaterally. Bladder is unremarkable. Stomach/Bowel: There is a small hiatal hernia. Appendix appears normal. No evidence of bowel wall thickening, distention, or inflammatory changes. Noninflamed diverticula are seen throughout the sigmoid colon. Vascular/Lymphatic: Aortic atherosclerosis. No enlarged abdominal or pelvic lymph nodes. Reproductive: There is mild prostate gland enlargement with moderate severity prostate calcification. Other: No abdominal wall hernia or abnormality. No abdominopelvic ascites. Musculoskeletal: Degenerative changes are seen throughout the lumbar spine. IMPRESSION: 1. Mild to moderate severity bibasilar atelectasis and/or infiltrate. 2. Very small bilateral pleural effusions. 3. Cholelithiasis. 4. Sigmoid diverticulosis. 5. Aortic atherosclerosis. Aortic Atherosclerosis (ICD10-I70.0).  Electronically Signed   By: Virgina Norfolk M.D.   On: 05/02/2021 00:20   DG Chest Port 1 View  Result Date: 05/01/2021 CLINICAL DATA:  [place], weakness EXAM: PORTABLE CHEST 1 VIEW COMPARISON:  01/31/2015 FINDINGS: Single frontal view of the chest demonstrates an unremarkable cardiac silhouette. Chronic scarring at the lung bases without  airspace disease, effusion, or pneumothorax. No acute bony abnormalities. IMPRESSION: 1. Stable chest, no acute process. Electronically Signed   By: Randa Ngo M.D.   On: 05/01/2021 23:08               LOS: 1 day   Gurinder Toral  [facility]   Pager on [email or web address]. If 7PM-7AM, please contact night-coverage at [email or web address]     05/03/2021, 12:04 PM

## 2021-05-03 NOTE — ED Notes (Signed)
Pt attempting to get out of safety mitts, pt given verbal reassurance, pt informed that safety mitts can come off when lunch arrives.

## 2021-05-03 NOTE — ED Notes (Signed)
Pt continues to move about on the bed, swinging at staff and kicking.  Oxygen cannula in place at 2 liters.  Mitts on both hands.  Siderails up x 4.  Telesitter in place.

## 2021-05-03 NOTE — ED Notes (Signed)
Pt calling out, pt states he wants to get OOB, pt disoriented to place, situation,  and time, oriented to self with first name only. External urinary catheter in place, tele sitter in place.

## 2021-05-03 NOTE — ED Notes (Signed)
Pt removed IV from arm and removed monitoring cords. Pt anxious and restless. Bandage applied, new IV in place, PRN meds ordered.

## 2021-05-03 NOTE — ED Notes (Signed)
This RN notified that pt pulling at wires and taking clothes off. While son in room, pt alert, not pulling at cords, when no family present, pt taking gown off and monitors. Pt placed back in gown, monitors reattached, pt given verbal encouragement to stay in bed and leave gown and monitors on. Will continue to assess.

## 2021-05-03 NOTE — ED Notes (Signed)
Pt attempting to get out of bed. External catheter removed and in floor, urine soaked bed found underneath pt. Pt cleaned, bed cleaned, new linen in place and new external catheter in place with mittens on bilateral hands for safety due to pt scratching privates and pulling at lines.

## 2021-05-04 ENCOUNTER — Other Ambulatory Visit: Payer: Self-pay

## 2021-05-04 ENCOUNTER — Encounter: Payer: Self-pay | Admitting: Family Medicine

## 2021-05-04 DIAGNOSIS — A419 Sepsis, unspecified organism: Secondary | ICD-10-CM | POA: Diagnosis not present

## 2021-05-04 DIAGNOSIS — R6520 Severe sepsis without septic shock: Secondary | ICD-10-CM | POA: Diagnosis not present

## 2021-05-04 DIAGNOSIS — J189 Pneumonia, unspecified organism: Secondary | ICD-10-CM | POA: Diagnosis not present

## 2021-05-04 LAB — COMPREHENSIVE METABOLIC PANEL
ALT: 31 U/L (ref 0–44)
AST: 45 U/L — ABNORMAL HIGH (ref 15–41)
Albumin: 3.1 g/dL — ABNORMAL LOW (ref 3.5–5.0)
Alkaline Phosphatase: 61 U/L (ref 38–126)
Anion gap: 8 (ref 5–15)
BUN: 27 mg/dL — ABNORMAL HIGH (ref 8–23)
CO2: 24 mmol/L (ref 22–32)
Calcium: 8.5 mg/dL — ABNORMAL LOW (ref 8.9–10.3)
Chloride: 105 mmol/L (ref 98–111)
Creatinine, Ser: 1.53 mg/dL — ABNORMAL HIGH (ref 0.61–1.24)
GFR, Estimated: 45 mL/min — ABNORMAL LOW (ref >60)
Glucose, Bld: 211 mg/dL — ABNORMAL HIGH (ref 70–99)
Potassium: 3.8 mmol/L (ref 3.5–5.1)
Sodium: 137 mmol/L (ref 135–145)
Total Bilirubin: 0.9 mg/dL (ref 0.3–1.2)
Total Protein: 6.7 g/dL (ref 6.5–8.1)

## 2021-05-04 LAB — AMMONIA: Ammonia: 16 umol/L (ref 9–35)

## 2021-05-04 LAB — CBC WITH DIFFERENTIAL/PLATELET
Abs Immature Granulocytes: 0.01 10*3/uL (ref 0.00–0.07)
Basophils Absolute: 0 10*3/uL (ref 0.0–0.1)
Basophils Relative: 0 %
Eosinophils Absolute: 0.1 10*3/uL (ref 0.0–0.5)
Eosinophils Relative: 3 %
HCT: 29 % — ABNORMAL LOW (ref 39.0–52.0)
Hemoglobin: 10.1 g/dL — ABNORMAL LOW (ref 13.0–17.0)
Immature Granulocytes: 0 %
Lymphocytes Relative: 14 %
Lymphs Abs: 0.7 10*3/uL (ref 0.7–4.0)
MCH: 36.6 pg — ABNORMAL HIGH (ref 26.0–34.0)
MCHC: 34.8 g/dL (ref 30.0–36.0)
MCV: 105.1 fL — ABNORMAL HIGH (ref 80.0–100.0)
Monocytes Absolute: 0.3 10*3/uL (ref 0.1–1.0)
Monocytes Relative: 5 %
Neutro Abs: 3.7 10*3/uL (ref 1.7–7.7)
Neutrophils Relative %: 78 %
Platelets: 108 10*3/uL — ABNORMAL LOW (ref 150–400)
RBC: 2.76 MIL/uL — ABNORMAL LOW (ref 4.22–5.81)
RDW: 14.6 % (ref 11.5–15.5)
WBC: 4.8 10*3/uL (ref 4.0–10.5)
nRBC: 0.4 % — ABNORMAL HIGH (ref 0.0–0.2)

## 2021-05-04 LAB — URINE CULTURE: Culture: 50000 — AB

## 2021-05-04 LAB — CBG MONITORING, ED
Glucose-Capillary: 191 mg/dL — ABNORMAL HIGH (ref 70–99)
Glucose-Capillary: 278 mg/dL — ABNORMAL HIGH (ref 70–99)

## 2021-05-04 LAB — GLUCOSE, CAPILLARY
Glucose-Capillary: 214 mg/dL — ABNORMAL HIGH (ref 70–99)
Glucose-Capillary: 197 mg/dL — ABNORMAL HIGH (ref 70–99)

## 2021-05-04 MED ORDER — INSULIN NPH (HUMAN) (ISOPHANE) 100 UNIT/ML ~~LOC~~ SUSP
20.0000 [IU] | Freq: Two times a day (BID) | SUBCUTANEOUS | Status: DC
  Filled 2021-05-04: qty 10

## 2021-05-04 MED ORDER — INSULIN DETEMIR 100 UNIT/ML ~~LOC~~ SOLN
20.0000 [IU] | Freq: Two times a day (BID) | SUBCUTANEOUS | Status: DC
  Administered 2021-05-05 – 2021-05-07 (×7): 20 [IU] via SUBCUTANEOUS
  Filled 2021-05-04 (×9): qty 0.2

## 2021-05-04 MED ORDER — INFLUENZA VAC A&B SA ADJ QUAD 0.5 ML IM PRSY
0.5000 mL | PREFILLED_SYRINGE | INTRAMUSCULAR | Status: AC
  Administered 2021-05-08: 0.5 mL via INTRAMUSCULAR
  Filled 2021-05-04 (×2): qty 0.5

## 2021-05-04 NOTE — ED Notes (Signed)
Informed RN bed assigned 

## 2021-05-04 NOTE — Progress Notes (Signed)
PT Cancellation Note  Patient Details Name: Alex Smith MRN: [id number] DOB: 10/29/1934   Cancelled Treatment:    Reason Eval/Treat Not Completed: Patient at procedure or test/unavailable. Pt currently off the floor. PT will attempt treatment at later date.    Patrina Levering PT, DPT 05/04/21 4:26 PM [phone]

## 2021-05-04 NOTE — ED Notes (Signed)
Pt given lunch tray.

## 2021-05-04 NOTE — Progress Notes (Addendum)
Progress Note    Alex Smith  [id number] DOB: 1935-04-03  DOA: 04/30/2021 PCP: Leonel Ramsay, MD      Brief Narrative:    Medical records reviewed and are as summarized below:  Alex Smith is a 86 y.o. male with medical history including but not limited to recent accidental/mechanical fall in [place] on 04/29/2021, hypertension, insulin-dependent diabetes mellitus type 2, morbid obesity, BPH, GERD, CKD stage III hyperlipidemia, history of pneumonia, history of squamous cell carcinoma in situ, history of basal cell carcinoma of face, vitamin B12 deficiency, colonic polyps.  He presented to [facility] hospital because of generalized weakness, another mechanical fall at home and generalized body pain.  He was febrile in the emergency room with temperature of 101.4 F.  CT abdomen pelvis showed bibasilar infiltrates concerning for pneumonia.  He was admitted to the hospital for severe sepsis secondary to community-acquired pneumonia complicated by acute metabolic encephalopathy.    Assessment/Plan:   Principal Problem:   Severe sepsis (HCC) Active Problems:   CAP (community acquired pneumonia)    Body mass index is 39.16 kg/m.  (Morbid obesity): This complicates overall care and prognosis   Severe sepsis secondary to community-acquired bilateral lower lobe pneumonia: Continue empiric IV antibiotics.  No growth on blood cultures thus far.  Severe sepsis as evidenced by fever (101.4), tachycardia (HR 99) and confusion/altered mental status.  Delirium/acute metabolic encephalopathy: No acute abnormality on CT head or MRI brain.  Continue Seroquel.  Use IM Haldol as needed for agitation.  Type II DM with hyperglycemia: Patient takes Humulin N 39 units twice daily at home.  Increase Humulin N from 15 to 20 units twice daily.  Continue NovoLog as needed for hyperglycemia.  Hypertension: Continue lisinopril  CKD stage IIIa: Creatinine is still around baseline.  S/p  recent fall on 04/29/2021: He still has sutures in the right hand.  PT and OT evaluation.    Diet Order             Diet Heart Room service appropriate? Yes; Fluid consistency: Thin  Diet effective now                      Consultants: None  Procedures: None    Medications:    aspirin EC  81 mg Oral Daily   diphenhydrAMINE  12.5 mg Oral QHS   enoxaparin (LOVENOX) injection  55 mg Subcutaneous Daily   guaiFENesin  600 mg Oral BID   insulin aspart  0-15 Units Subcutaneous TID WC   insulin aspart  0-5 Units Subcutaneous QHS   insulin NPH Human  20 Units Subcutaneous BID AC & HS   lisinopril  10 mg Oral BID   nystatin   Topical BID   pantoprazole  40 mg Oral Daily   pravastatin  20 mg Oral QPM   QUEtiapine  25 mg Oral QHS   tamsulosin  0.4 mg Oral Daily   Tbo-filgastrim (GRANIX) SQ  480 mcg Subcutaneous Once   vitamin B-12  1,000 mcg Oral Daily   vitamin E  200 Units Oral Daily   Continuous Infusions:  azithromycin Stopped (05/04/21 [id number])   cefTRIAXone (ROCEPHIN)  IV Stopped (05/04/21 0007)   diphenhydrAMINE Stopped (05/03/21 0420)     Anti-infectives (From admission, onward)    Start     Dose/Rate Route Frequency Ordered Stop   05/02/21 2200  cefTRIAXone (ROCEPHIN) 2 g in sodium chloride 0.9 % 100 mL IVPB  Status:  Discontinued  2 g 200 mL/hr over 30 Minutes Intravenous Every 24 hours 05/02/21 0242 05/02/21 0246   05/02/21 2200  cefTRIAXone (ROCEPHIN) 2 g in sodium chloride 0.9 % 100 mL IVPB        2 g 200 mL/hr over 30 Minutes Intravenous Every 24 hours 05/02/21 0246 05/06/21 2159   05/02/21 0400  azithromycin (ZITHROMAX) 500 mg in sodium chloride 0.9 % 250 mL IVPB        500 mg 250 mL/hr over 60 Minutes Intravenous Every 24 hours 05/02/21 0242 05/07/21 0359   05/02/21 0030  vancomycin (VANCOCIN) IVPB 1000 mg/200 mL premix        1,000 mg 200 mL/hr over 60 Minutes Intravenous  Once 05/02/21 0027 05/02/21 0243   05/02/21 0030  ceFEPIme (MAXIPIME)  2 g in sodium chloride 0.9 % 100 mL IVPB        2 g 200 mL/hr over 30 Minutes Intravenous  Once 05/02/21 0027 05/02/21 0134   05/01/21 2245  cefTRIAXone (ROCEPHIN) 2 g in sodium chloride 0.9 % 100 mL IVPB  Status:  Discontinued        2 g 200 mL/hr over 30 Minutes Intravenous Every 24 hours 05/01/21 2236 05/02/21 0245              Family Communication/Anticipated D/C date and plan/Code Status   DVT prophylaxis:      Code Status: Full Code  Family Communication: Plan discussed with Doug, son, at the bedside Disposition Plan:    Status is: Inpatient  Remains inpatient appropriate because:IV treatments appropriate due to intensity of illness or inability to take PO and Inpatient level of care appropriate due to severity of illness  Dispo: The patient is from: Home              Anticipated d/c is to: Home              Patient currently is not medically stable to d/c.   Difficult to place patient No           Subjective:   Interval events noted.  He has been intermittently confused and agitated overnight.  He said he feels better this morning.  His daughter, [name], was at the bedside.  Objective:    Vitals:   05/03/21 2000 05/04/21 0226 05/04/21 0625 05/04/21 0920  BP:  (!) 132/48 (!) 126/54 (!) 146/60  Pulse: (!) 122 84 87 79  Resp:  '16 18 20  '$ Temp:    97.9 F (36.6 C)  TempSrc:    Oral  SpO2: 98% 96% 98% 95%  Weight:      Height:       No data found.   Intake/Output Summary (Last 24 hours) at 05/04/2021 1209 Last data filed at 05/04/2021 0625 Gross per 24 hour  Intake 350 ml  Output --  Net 350 ml   Filed Weights   04/29/21 2316  Weight: 113.4 kg    Exam:  GEN: NAD SKIN: Warm and dry.  Bruises on the left side of the face.  Erythematous area on the scrotum. EYES: No pallor or icterus ENT: MMM CV: RRR PULM: CTA B ABD: soft, ND, NT, +BS CNS: AAO x 1 (person), non focal EXT: No edema or tenderness.  Sutures on the palmar aspect of the  right third finger PIP joint and thumb interphalangeal joint.        Data Reviewed:   I have personally reviewed following labs and imaging studies:  Labs: Labs show the following:  Basic Metabolic Panel: Recent Labs  Lab 04/27/21 1501 04/29/21 2328 05/01/21 2244 05/02/21 0648 05/04/21 0915  NA 138 137 135 135 137  K 3.8 4.0 3.5 4.1 3.8  CL 107 107 103 104 105  CO2 '25 22 24 25 24  '$ GLUCOSE 310* 228* 220* 230* 211*  BUN 21 24* 24* 23 27*  CREATININE 1.44* 1.48* 1.39* 1.34* 1.53*  CALCIUM 8.7* 8.9 8.3* 8.3* 8.5*   GFR Estimated Creatinine Clearance: 43.2 mL/min (A) (by C-G formula based on SCr of 1.53 mg/dL (H)). Liver Function Tests: Recent Labs  Lab 04/27/21 1501 04/29/21 2328 05/01/21 2244 05/04/21 0915  AST 23 30 69* 45*  ALT '16 18 26 31  '$ ALKPHOS 74 72 59 61  BILITOT 1.1 1.6* 1.3* 0.9  PROT 7.8 7.7 6.3* 6.7  ALBUMIN 4.0 4.0 3.2* 3.1*   No results for input(s): LIPASE, AMYLASE in the last 168 hours. Recent Labs  Lab 05/04/21 0915  AMMONIA 16   Coagulation profile Recent Labs  Lab 05/01/21 2244 05/02/21 0648  INR 1.1 1.1    CBC: Recent Labs  Lab 04/27/21 1501 04/29/21 2328 05/01/21 2244 05/02/21 0648 05/04/21 0915  WBC 4.1 4.8 4.8 4.1 4.8  NEUTROABS 2.5  --  3.7  --  3.7  HGB 10.6* 11.0* 10.0* 10.7* 10.1*  HCT 31.7* 31.9* 29.2* 30.5* 29.0*  MCV 106.4* 105.3* 104.3* 103.7* 105.1*  PLT 109* 113* 99* 93* 108*   Cardiac Enzymes: No results for input(s): CKTOTAL, CKMB, CKMBINDEX, TROPONINI in the last 168 hours. BNP (last 3 results) No results for input(s): PROBNP in the last 8760 hours. CBG: Recent Labs  Lab 05/03/21 0733 05/03/21 1158 05/03/21 1906 05/03/21 2227 05/04/21 0801  GLUCAP 273* 259* 287* 225* 191*   D-Dimer: No results for input(s): DDIMER in the last 72 hours. Hgb A1c: No results for input(s): HGBA1C in the last 72 hours. Lipid Profile: No results for input(s): CHOL, HDL, LDLCALC, TRIG, CHOLHDL, LDLDIRECT in the  last 72 hours. Thyroid function studies: No results for input(s): TSH, T4TOTAL, T3FREE, THYROIDAB in the last 72 hours.  Invalid input(s): FREET3 Anemia work up: No results for input(s): VITAMINB12, FOLATE, FERRITIN, TIBC, IRON, RETICCTPCT in the last 72 hours. Sepsis Labs: Recent Labs  Lab 04/29/21 2328 05/01/21 2244 05/02/21 0648 05/04/21 0915  PROCALCITON  --   --  0.22  --   WBC 4.8 4.8 4.1 4.8  LATICACIDVEN  --  1.6 1.4  --     Microbiology Recent Results (from the past 240 hour(s))  Resp Panel by RT-PCR (Flu A&B, Covid) Nasopharyngeal Swab     Status: None   Collection Time: 04/30/21  1:54 AM   Specimen: Nasopharyngeal Swab; Nasopharyngeal(NP) swabs in vial transport medium  Result Value Ref Range Status   SARS Coronavirus 2 by RT PCR NEGATIVE NEGATIVE Final    Comment: (NOTE) SARS-CoV-2 target nucleic acids are NOT DETECTED.  The SARS-CoV-2 RNA is generally detectable in upper respiratory specimens during the acute phase of infection. The lowest concentration of SARS-CoV-2 viral copies this assay can detect is 138 copies/mL. A negative result does not preclude SARS-Cov-2 infection and should not be used as the sole basis for treatment or other patient management decisions. A negative result may occur with  improper specimen collection/handling, submission of specimen other than nasopharyngeal swab, presence of viral mutation(s) within the areas targeted by this assay, and inadequate number of viral copies(<138 copies/mL). A negative result must be combined with clinical observations, patient history, and epidemiological information.  The expected result is Negative.  Fact Sheet for Patients:  EntrepreneurPulse.com.au  Fact Sheet for Healthcare Providers:  IncredibleEmployment.be  This test is no t yet approved or cleared by the [place] FDA and  has been authorized for detection and/or diagnosis of SARS-CoV-2 by FDA under  an Emergency Use Authorization (EUA). This EUA will remain  in effect (meaning this test can be used) for the duration of the COVID-19 declaration under Section 564(b)(1) of the Act, 21 U.S.C.section 360bbb-3(b)(1), unless the authorization is terminated  or revoked sooner.       Influenza A by PCR NEGATIVE NEGATIVE Final   Influenza B by PCR NEGATIVE NEGATIVE Final    Comment: (NOTE) The Xpert Xpress SARS-CoV-2/FLU/RSV plus assay is intended as an aid in the diagnosis of influenza from Nasopharyngeal swab specimens and should not be used as a sole basis for treatment. Nasal washings and aspirates are unacceptable for Xpert Xpress SARS-CoV-2/FLU/RSV testing.  Fact Sheet for Patients: EntrepreneurPulse.com.au  Fact Sheet for Healthcare Providers: IncredibleEmployment.be  This test is not yet approved or cleared by the [place] FDA and has been authorized for detection and/or diagnosis of SARS-CoV-2 by FDA under an Emergency Use Authorization (EUA). This EUA will remain in effect (meaning this test can be used) for the duration of the COVID-19 declaration under Section 564(b)(1) of the Act, 21 U.S.C. section 360bbb-3(b)(1), unless the authorization is terminated or revoked.  Performed at [facility], [address] [place], [place] [address]   Urine Culture     Status: Abnormal   Collection Time: 05/01/21 10:48 AM   Specimen: Urine, Random  Result Value Ref Range Status   Specimen Description   Final    URINE, RANDOM Performed at [facility], [address] [place], [place] [address]    Special Requests   Final    NONE Performed at [facility], [place], [place] [address]    Culture 50,000 COLONIES/mL SERRATIA MARCESCENS (A)  Final   Report Status 05/04/2021 FINAL  Final   Organism ID, Bacteria SERRATIA MARCESCENS (A)  Final      Susceptibility   Serratia marcescens - MIC*     CEFAZOLIN >=64 RESISTANT Resistant     CEFEPIME <=0.12 SENSITIVE Sensitive     CEFTRIAXONE <=0.25 SENSITIVE Sensitive     CIPROFLOXACIN 1 SENSITIVE Sensitive     GENTAMICIN <=1 SENSITIVE Sensitive     NITROFURANTOIN 128 RESISTANT Resistant     TRIMETH/SULFA <=20 SENSITIVE Sensitive     * 50,000 COLONIES/mL SERRATIA MARCESCENS  Blood Culture (routine x 2)     Status: None (Preliminary result)   Collection Time: 05/01/21 10:44 PM   Specimen: BLOOD  Result Value Ref Range Status   Specimen Description BLOOD LEFT WRIST  Final   Special Requests   Final    BOTTLES DRAWN AEROBIC AND ANAEROBIC Blood Culture adequate volume   Culture   Final    NO GROWTH 3 DAYS Performed at [facility], [address], [place], [place] [address]    Report Status PENDING  Incomplete  Blood Culture (routine x 2)     Status: None (Preliminary result)   Collection Time: 05/01/21 10:45 PM   Specimen: BLOOD  Result Value Ref Range Status   Specimen Description BLOOD RIGHT FOREARM  Final   Special Requests   Final    BOTTLES DRAWN AEROBIC AND ANAEROBIC Blood Culture adequate volume   Culture   Final    NO GROWTH  3 DAYS Performed at [facility], [address] [place], [place] [address]    Report Status PENDING  Incomplete  Resp Panel by RT-PCR (Flu A&B, Covid) Nasopharyngeal Swab     Status: None   Collection Time: 05/02/21 12:20 AM   Specimen: Nasopharyngeal Swab; Nasopharyngeal(NP) swabs in vial transport medium  Result Value Ref Range Status   SARS Coronavirus 2 by RT PCR NEGATIVE NEGATIVE Final    Comment: (NOTE) SARS-CoV-2 target nucleic acids are NOT DETECTED.  The SARS-CoV-2 RNA is generally detectable in upper respiratory specimens during the acute phase of infection. The lowest concentration of SARS-CoV-2 viral copies this assay can detect is 138 copies/mL. A negative result does not preclude SARS-Cov-2 infection and should not be used as the sole basis for treatment  or other patient management decisions. A negative result may occur with  improper specimen collection/handling, submission of specimen other than nasopharyngeal swab, presence of viral mutation(s) within the areas targeted by this assay, and inadequate number of viral copies(<138 copies/mL). A negative result must be combined with clinical observations, patient history, and epidemiological information. The expected result is Negative.  Fact Sheet for Patients:  EntrepreneurPulse.com.au  Fact Sheet for Healthcare Providers:  IncredibleEmployment.be  This test is no t yet approved or cleared by the [place] FDA and  has been authorized for detection and/or diagnosis of SARS-CoV-2 by FDA under an Emergency Use Authorization (EUA). This EUA will remain  in effect (meaning this test can be used) for the duration of the COVID-19 declaration under Section 564(b)(1) of the Act, 21 U.S.C.section 360bbb-3(b)(1), unless the authorization is terminated  or revoked sooner.       Influenza A by PCR NEGATIVE NEGATIVE Final   Influenza B by PCR NEGATIVE NEGATIVE Final    Comment: (NOTE) The Xpert Xpress SARS-CoV-2/FLU/RSV plus assay is intended as an aid in the diagnosis of influenza from Nasopharyngeal swab specimens and should not be used as a sole basis for treatment. Nasal washings and aspirates are unacceptable for Xpert Xpress SARS-CoV-2/FLU/RSV testing.  Fact Sheet for Patients: EntrepreneurPulse.com.au  Fact Sheet for Healthcare Providers: IncredibleEmployment.be  This test is not yet approved or cleared by the [place] FDA and has been authorized for detection and/or diagnosis of SARS-CoV-2 by FDA under an Emergency Use Authorization (EUA). This EUA will remain in effect (meaning this test can be used) for the duration of the COVID-19 declaration under Section 564(b)(1) of the Act, 21 U.S.C. section  360bbb-3(b)(1), unless the authorization is terminated or revoked.  Performed at [facility], [address] [place], [place] [address]     Procedures and diagnostic studies:  No results found.             LOS: 2 days   Alvira Hecht  [facility]   Pager on [email or web address]. If 7PM-7AM, please contact night-coverage at [email or web address]     05/04/2021, 12:09 PM

## 2021-05-04 NOTE — ED Notes (Signed)
Pt changes and new brief placed on him as well as barrier ointment. Pt does present with redness on his scrotum.

## 2021-05-04 NOTE — ED Notes (Signed)
CBG 191 

## 2021-05-04 NOTE — ED Notes (Signed)
Pt given breakfast tray.  Mittens taken off while eating.  Pt's daughter and tele-sitter both present in room with the patient.

## 2021-05-05 DIAGNOSIS — R6520 Severe sepsis without septic shock: Secondary | ICD-10-CM | POA: Diagnosis not present

## 2021-05-05 DIAGNOSIS — J189 Pneumonia, unspecified organism: Secondary | ICD-10-CM | POA: Diagnosis not present

## 2021-05-05 DIAGNOSIS — A419 Sepsis, unspecified organism: Secondary | ICD-10-CM | POA: Diagnosis not present

## 2021-05-05 LAB — GLUCOSE, CAPILLARY
Glucose-Capillary: 165 mg/dL — ABNORMAL HIGH (ref 70–99)
Glucose-Capillary: 221 mg/dL — ABNORMAL HIGH (ref 70–99)
Glucose-Capillary: 159 mg/dL — ABNORMAL HIGH (ref 70–99)
Glucose-Capillary: 173 mg/dL — ABNORMAL HIGH (ref 70–99)

## 2021-05-05 MED ORDER — CEFDINIR 300 MG PO CAPS
300.0000 mg | ORAL_CAPSULE | Freq: Two times a day (BID) | ORAL | Status: AC
  Administered 2021-05-05 – 2021-05-07 (×4): 300 mg via ORAL
  Filled 2021-05-05 (×4): qty 1

## 2021-05-05 NOTE — TOC Progression Note (Signed)
Transition of Care [facility]) - Progression Note    Patient Details  Name: Alex Smith MRN: [id number] Date of Birth: 1934/09/06  [facility] CM/SW [place], LCSW Phone Number: 05/05/2021, 9:10 AM  Clinical Narrative:   Currently no bed offers. Asked Magda Paganini with [facility] to review. Will follow for offers.       Barriers to Discharge: No Barriers Identified  Expected Discharge Plan and Services                                                 Social Determinants of Health (SDOH) Interventions    Readmission Risk Interventions No flowsheet data found.

## 2021-05-05 NOTE — Progress Notes (Addendum)
Progress Note    Alex Smith  [id number] DOB: [birth year]  DOA: 04/30/2021 PCP: Leonel Ramsay, MD      Brief Narrative:    Medical records reviewed and are as summarized below:  Alex Smith is a 86 y.o. male with medical history including but not limited to recent accidental/mechanical fall in [place] on 04/29/2021, hypertension, insulin-dependent diabetes mellitus type 2, morbid obesity, BPH, GERD, CKD stage III hyperlipidemia, history of pneumonia, history of squamous cell carcinoma in situ, history of basal cell carcinoma of face, vitamin B12 deficiency, colonic polyps.  He presented to [facility] hospital because of generalized weakness, another mechanical fall at home and generalized body pain.  He was febrile in the emergency room with temperature of 101.4 F.  CT abdomen pelvis showed bibasilar infiltrates concerning for pneumonia.  He was admitted to the hospital for severe sepsis secondary to community-acquired pneumonia complicated by acute metabolic encephalopathy.    Assessment/Plan:   Principal Problem:   Severe sepsis (HCC) Active Problems:   CAP (community acquired pneumonia)    Body mass index is 39.16 kg/m.  (Morbid obesity): This complicates overall care and prognosis   Severe sepsis secondary to community-acquired bilateral lower lobe pneumonia: Discontinue oral azithromycin and IV Rocephin.  Start Omnicef to complete 7-day course of antibiotics.  No growth on blood cultures.    Delirium/acute metabolic encephalopathy: Slowly improving.  No acute abnormality on CT head or MRI brain.  Continue Seroquel.  Use IM Haldol as needed for agitation.  Type II DM with hyperglycemia: Patient takes Humulin N 39 units twice daily at home.  Increase Levemir from 20 to 25 units twice daily.  Continue NovoLog as needed for hyperglycemia.  Hypertension: Continue lisinopril  CKD stage IIIa: Creatinine is still around baseline.  S/p recent fall on 04/29/2021:  He still has sutures in the right hand.  PT and OT evaluation.    Diet Order             Diet Heart Room service appropriate? Yes; Fluid consistency: Thin  Diet effective now                      Consultants: None  Procedures: None    Medications:    aspirin EC  81 mg Oral Daily   diphenhydrAMINE  12.5 mg Oral QHS   enoxaparin (LOVENOX) injection  55 mg Subcutaneous Daily   guaiFENesin  600 mg Oral BID   influenza vaccine adjuvanted  0.5 mL Intramuscular Tomorrow-1000   insulin aspart  0-15 Units Subcutaneous TID WC   insulin aspart  0-5 Units Subcutaneous QHS   insulin detemir  20 Units Subcutaneous BID AC & HS   lisinopril  10 mg Oral BID   nystatin   Topical BID   pantoprazole  40 mg Oral Daily   pravastatin  20 mg Oral QPM   QUEtiapine  25 mg Oral QHS   tamsulosin  0.4 mg Oral Daily   vitamin B-12  1,000 mcg Oral Daily   vitamin E  200 Units Oral Daily   Continuous Infusions:  azithromycin 500 mg (05/05/21 0403)   cefTRIAXone (ROCEPHIN)  IV 2 g (05/04/21 2316)   diphenhydrAMINE Stopped (05/03/21 0420)     Anti-infectives (From admission, onward)    Start     Dose/Rate Route Frequency Ordered Stop   05/02/21 2200  cefTRIAXone (ROCEPHIN) 2 g in sodium chloride 0.9 % 100 mL IVPB  Status:  Discontinued  2 g 200 mL/hr over 30 Minutes Intravenous Every 24 hours 05/02/21 0242 05/02/21 0246   05/02/21 2200  cefTRIAXone (ROCEPHIN) 2 g in sodium chloride 0.9 % 100 mL IVPB        2 g 200 mL/hr over 30 Minutes Intravenous Every 24 hours 05/02/21 0246 05/06/21 2159   05/02/21 0400  azithromycin (ZITHROMAX) 500 mg in sodium chloride 0.9 % 250 mL IVPB        500 mg 250 mL/hr over 60 Minutes Intravenous Every 24 hours 05/02/21 0242 05/07/21 0359   05/02/21 0030  vancomycin (VANCOCIN) IVPB 1000 mg/200 mL premix        1,000 mg 200 mL/hr over 60 Minutes Intravenous  Once 05/02/21 0027 05/02/21 0243   05/02/21 0030  ceFEPIme (MAXIPIME) 2 g in sodium chloride  0.9 % 100 mL IVPB        2 g 200 mL/hr over 30 Minutes Intravenous  Once 05/02/21 0027 05/02/21 0134   05/01/21 2245  cefTRIAXone (ROCEPHIN) 2 g in sodium chloride 0.9 % 100 mL IVPB  Status:  Discontinued        2 g 200 mL/hr over 30 Minutes Intravenous Every 24 hours 05/01/21 2236 05/02/21 0245              Family Communication/Anticipated D/C date and plan/Code Status   DVT prophylaxis:      Code Status: Full Code  Family Communication: Plan discussed with Jeneen Rinks, son, at the bedside Disposition Plan:    Status is: Inpatient  Remains inpatient appropriate because:IV treatments appropriate due to intensity of illness or inability to take PO and Inpatient level of care appropriate due to severity of illness  Dispo: The patient is from: Home              Anticipated d/c is to: Home              Patient currently is not medically stable to d/c.   Difficult to place patient No           Subjective:   Interval events noted.  He has no complaints.  He said he feels better today.  His son, Muaz, was at the bedside.  Chart Hung reports that patient is still confused although he seems to be improving.  Objective:    Vitals:   05/04/21 1400 05/04/21 1630 05/04/21 2113 05/05/21 0353  BP: (!) 110/94 (!) 151/72 (!) 155/75 (!) 156/75  Pulse: 88 85 94 92  Resp: '18 18 20 20  '$ Temp: 97.9 F (36.6 C) 97.8 F (36.6 C) 97.9 F (36.6 C) 98.2 F (36.8 C)  TempSrc: Oral Oral Oral   SpO2: 98% 95% 95% 97%  Weight:      Height:       No data found.   Intake/Output Summary (Last 24 hours) at 05/05/2021 1407 Last data filed at 05/05/2021 1016 Gross per 24 hour  Intake 360 ml  Output --  Net 360 ml   Filed Weights   04/29/21 2316  Weight: 113.4 kg    Exam:  GEN: NAD SKIN: No rash.  Bruises on the left side of the face. EYES: EOMI ENT: MMM CV: RRR PULM: CTA B ABD: soft, ND, NT, +BS CNS: AAO x 3, non focal EXT: No edema or tenderness. Sutures on the palmar  aspect of the right third finger PIP joint and thumb interphalangeal joint.       Data Reviewed:   I have personally reviewed following labs and imaging studies:  Labs: Labs show the following:   Basic Metabolic Panel: Recent Labs  Lab 04/29/21 2328 05/01/21 2244 05/02/21 0648 05/04/21 0915  NA 137 135 135 137  K 4.0 3.5 4.1 3.8  CL 107 103 104 105  CO2 '22 24 25 24  '$ GLUCOSE 228* 220* 230* 211*  BUN 24* 24* 23 27*  CREATININE 1.48* 1.39* 1.34* 1.53*  CALCIUM 8.9 8.3* 8.3* 8.5*   GFR Estimated Creatinine Clearance: 43.2 mL/min (A) (by C-G formula based on SCr of 1.53 mg/dL (H)). Liver Function Tests: Recent Labs  Lab 04/29/21 2328 05/01/21 2244 05/04/21 0915  AST 30 69* 45*  ALT '18 26 31  '$ ALKPHOS 72 59 61  BILITOT 1.6* 1.3* 0.9  PROT 7.7 6.3* 6.7  ALBUMIN 4.0 3.2* 3.1*   No results for input(s): LIPASE, AMYLASE in the last 168 hours. Recent Labs  Lab 05/04/21 0915  AMMONIA 16   Coagulation profile Recent Labs  Lab 05/01/21 2244 05/02/21 0648  INR 1.1 1.1    CBC: Recent Labs  Lab 04/29/21 2328 05/01/21 2244 05/02/21 0648 05/04/21 0915  WBC 4.8 4.8 4.1 4.8  NEUTROABS  --  3.7  --  3.7  HGB 11.0* 10.0* 10.7* 10.1*  HCT 31.9* 29.2* 30.5* 29.0*  MCV 105.3* 104.3* 103.7* 105.1*  PLT 113* 99* 93* 108*   Cardiac Enzymes: No results for input(s): CKTOTAL, CKMB, CKMBINDEX, TROPONINI in the last 168 hours. BNP (last 3 results) No results for input(s): PROBNP in the last 8760 hours. CBG: Recent Labs  Lab 05/04/21 1224 05/04/21 1650 05/04/21 2255 05/05/21 0939 05/05/21 1237  GLUCAP 278* 214* 197* 165* 221*   D-Dimer: No results for input(s): DDIMER in the last 72 hours. Hgb A1c: No results for input(s): HGBA1C in the last 72 hours. Lipid Profile: No results for input(s): CHOL, HDL, LDLCALC, TRIG, CHOLHDL, LDLDIRECT in the last 72 hours. Thyroid function studies: No results for input(s): TSH, T4TOTAL, T3FREE, THYROIDAB in the last 72  hours.  Invalid input(s): FREET3 Anemia work up: No results for input(s): VITAMINB12, FOLATE, FERRITIN, TIBC, IRON, RETICCTPCT in the last 72 hours. Sepsis Labs: Recent Labs  Lab 04/29/21 2328 05/01/21 2244 05/02/21 0648 05/04/21 0915  PROCALCITON  --   --  0.22  --   WBC 4.8 4.8 4.1 4.8  LATICACIDVEN  --  1.6 1.4  --     Microbiology Recent Results (from the past 240 hour(s))  Resp Panel by RT-PCR (Flu A&B, Covid) Nasopharyngeal Swab     Status: None   Collection Time: 04/30/21  1:54 AM   Specimen: Nasopharyngeal Swab; Nasopharyngeal(NP) swabs in vial transport medium  Result Value Ref Range Status   SARS Coronavirus 2 by RT PCR NEGATIVE NEGATIVE Final    Comment: (NOTE) SARS-CoV-2 target nucleic acids are NOT DETECTED.  The SARS-CoV-2 RNA is generally detectable in upper respiratory specimens during the acute phase of infection. The lowest concentration of SARS-CoV-2 viral copies this assay can detect is 138 copies/mL. A negative result does not preclude SARS-Cov-2 infection and should not be used as the sole basis for treatment or other patient management decisions. A negative result may occur with  improper specimen collection/handling, submission of specimen other than nasopharyngeal swab, presence of viral mutation(s) within the areas targeted by this assay, and inadequate number of viral copies(<138 copies/mL). A negative result must be combined with clinical observations, patient history, and epidemiological information. The expected result is Negative.  Fact Sheet for Patients:  EntrepreneurPulse.com.au  Fact Sheet for Healthcare Providers:  IncredibleEmployment.be  This test is no t yet approved or cleared by the [place] and  has been authorized for detection and/or diagnosis of SARS-CoV-2 by FDA under an Emergency Use Authorization (EUA). This EUA will remain  in effect (meaning this test can be used) for the  duration of the COVID-19 declaration under Section 564(b)(1) of the Act, 21 U.S.C.section 360bbb-3(b)(1), unless the authorization is terminated  or revoked sooner.       Influenza A by PCR NEGATIVE NEGATIVE Final   Influenza B by PCR NEGATIVE NEGATIVE Final    Comment: (NOTE) The Xpert Xpress SARS-CoV-2/FLU/RSV plus assay is intended as an aid in the diagnosis of influenza from Nasopharyngeal swab specimens and should not be used as a sole basis for treatment. Nasal washings and aspirates are unacceptable for Xpert Xpress SARS-CoV-2/FLU/RSV testing.  Fact Sheet for Patients: EntrepreneurPulse.com.au  Fact Sheet for Healthcare Providers: IncredibleEmployment.be  This test is not yet approved or cleared by the [place] FDA and has been authorized for detection and/or diagnosis of SARS-CoV-2 by FDA under an Emergency Use Authorization (EUA). This EUA will remain in effect (meaning this test can be used) for the duration of the COVID-19 declaration under Section 564(b)(1) of the Act, 21 U.S.C. section 360bbb-3(b)(1), unless the authorization is terminated or revoked.  Performed at [facility], [address] [place], [place] [address]   Urine Culture     Status: Abnormal   Collection Time: 05/01/21 10:48 AM   Specimen: Urine, Random  Result Value Ref Range Status   Specimen Description   Final    URINE, RANDOM Performed at [facility], [address] [place], [place] [address]    Special Requests   Final    NONE Performed at [facility], [place], [place] [address]    Culture 50,000 COLONIES/mL SERRATIA MARCESCENS (A)  Final   Report Status 05/04/2021 FINAL  Final   Organism ID, Bacteria SERRATIA MARCESCENS (A)  Final      Susceptibility   Serratia marcescens - MIC*    CEFAZOLIN >=64 RESISTANT Resistant     CEFEPIME <=0.12 SENSITIVE Sensitive     CEFTRIAXONE <=0.25 SENSITIVE  Sensitive     CIPROFLOXACIN 1 SENSITIVE Sensitive     GENTAMICIN <=1 SENSITIVE Sensitive     NITROFURANTOIN 128 RESISTANT Resistant     TRIMETH/SULFA <=20 SENSITIVE Sensitive     * 50,000 COLONIES/mL SERRATIA MARCESCENS  Blood Culture (routine x 2)     Status: None (Preliminary result)   Collection Time: 05/01/21 10:44 PM   Specimen: BLOOD  Result Value Ref Range Status   Specimen Description BLOOD LEFT WRIST  Final   Special Requests   Final    BOTTLES DRAWN AEROBIC AND ANAEROBIC Blood Culture adequate volume   Culture   Final    NO GROWTH 4 DAYS Performed at [facility], [address], [place], [place] [address]    Report Status PENDING  Incomplete  Blood Culture (routine x 2)     Status: None (Preliminary result)   Collection Time: 05/01/21 10:45 PM   Specimen: BLOOD  Result Value Ref Range Status   Specimen Description BLOOD RIGHT FOREARM  Final   Special Requests   Final    BOTTLES DRAWN AEROBIC AND ANAEROBIC Blood Culture adequate volume   Culture   Final    NO GROWTH 4 DAYS Performed at [facility], [address], [place], [place] [address]    Report Status  PENDING  Incomplete  Resp Panel by RT-PCR (Flu A&B, Covid) Nasopharyngeal Swab     Status: None   Collection Time: 05/02/21 12:20 AM   Specimen: Nasopharyngeal Swab; Nasopharyngeal(NP) swabs in vial transport medium  Result Value Ref Range Status   SARS Coronavirus 2 by RT PCR NEGATIVE NEGATIVE Final    Comment: (NOTE) SARS-CoV-2 target nucleic acids are NOT DETECTED.  The SARS-CoV-2 RNA is generally detectable in upper respiratory specimens during the acute phase of infection. The lowest concentration of SARS-CoV-2 viral copies this assay can detect is 138 copies/mL. A negative result does not preclude SARS-Cov-2 infection and should not be used as the sole basis for treatment or other patient management decisions. A negative result may occur with  improper specimen collection/handling,  submission of specimen other than nasopharyngeal swab, presence of viral mutation(s) within the areas targeted by this assay, and inadequate number of viral copies(<138 copies/mL). A negative result must be combined with clinical observations, patient history, and epidemiological information. The expected result is Negative.  Fact Sheet for Patients:  EntrepreneurPulse.com.au  Fact Sheet for Healthcare Providers:  IncredibleEmployment.be  This test is no t yet approved or cleared by the [place] FDA and  has been authorized for detection and/or diagnosis of SARS-CoV-2 by FDA under an Emergency Use Authorization (EUA). This EUA will remain  in effect (meaning this test can be used) for the duration of the COVID-19 declaration under Section 564(b)(1) of the Act, 21 U.S.C.section 360bbb-3(b)(1), unless the authorization is terminated  or revoked sooner.       Influenza A by PCR NEGATIVE NEGATIVE Final   Influenza B by PCR NEGATIVE NEGATIVE Final    Comment: (NOTE) The Xpert Xpress SARS-CoV-2/FLU/RSV plus assay is intended as an aid in the diagnosis of influenza from Nasopharyngeal swab specimens and should not be used as a sole basis for treatment. Nasal washings and aspirates are unacceptable for Xpert Xpress SARS-CoV-2/FLU/RSV testing.  Fact Sheet for Patients: EntrepreneurPulse.com.au  Fact Sheet for Healthcare Providers: IncredibleEmployment.be  This test is not yet approved or cleared by the [place] FDA and has been authorized for detection and/or diagnosis of SARS-CoV-2 by FDA under an Emergency Use Authorization (EUA). This EUA will remain in effect (meaning this test can be used) for the duration of the COVID-19 declaration under Section 564(b)(1) of the Act, 21 U.S.C. section 360bbb-3(b)(1), unless the authorization is terminated or revoked.  Performed at [facility], [address] [place],  [address]     Procedures and diagnostic studies:  No results found.             LOS: 3 days   Aby Gessel  [facility]   Pager on [email or web address]. If 7PM-7AM, please contact night-coverage at [email or web address]     05/05/2021, 2:07 PM

## 2021-05-06 DIAGNOSIS — J189 Pneumonia, unspecified organism: Secondary | ICD-10-CM | POA: Diagnosis not present

## 2021-05-06 DIAGNOSIS — R6520 Severe sepsis without septic shock: Secondary | ICD-10-CM | POA: Diagnosis not present

## 2021-05-06 DIAGNOSIS — A419 Sepsis, unspecified organism: Secondary | ICD-10-CM | POA: Diagnosis not present

## 2021-05-06 LAB — CULTURE, BLOOD (ROUTINE X 2)
Culture: NO GROWTH
Culture: NO GROWTH
Special Requests: ADEQUATE
Special Requests: ADEQUATE

## 2021-05-06 LAB — GLUCOSE, CAPILLARY
Glucose-Capillary: 113 mg/dL — ABNORMAL HIGH (ref 70–99)
Glucose-Capillary: 164 mg/dL — ABNORMAL HIGH (ref 70–99)
Glucose-Capillary: 168 mg/dL — ABNORMAL HIGH (ref 70–99)
Glucose-Capillary: 147 mg/dL — ABNORMAL HIGH (ref 70–99)

## 2021-05-06 MED ORDER — AMLODIPINE BESYLATE 5 MG PO TABS
5.0000 mg | ORAL_TABLET | Freq: Every day | ORAL | Status: DC
  Administered 2021-05-06 – 2021-05-11 (×6): 5 mg via ORAL
  Filled 2021-05-06 (×6): qty 1

## 2021-05-06 NOTE — Progress Notes (Signed)
Progress Note    SYLVESTRE DROTAR  [id number] DOB: 1935/04/10  DOA: 04/30/2021 PCP: Leonel Ramsay, MD      Brief Narrative:    Medical records reviewed and are as summarized below:  TAHJE SAVALA is a 86 y.o. male with medical history including but not limited to recent accidental/mechanical fall in [place] on 04/29/2021, hypertension, insulin-dependent diabetes mellitus type 2, morbid obesity, BPH, GERD, CKD stage III hyperlipidemia, history of pneumonia, history of squamous cell carcinoma in situ, history of basal cell carcinoma of face, vitamin B12 deficiency, colonic polyps.  He presented to [facility] hospital because of generalized weakness, another mechanical fall at home and generalized body pain.  He was febrile in the emergency room with temperature of 101.4 F.  CT abdomen pelvis showed bibasilar infiltrates concerning for pneumonia.  He was admitted to the hospital for severe sepsis secondary to community-acquired pneumonia complicated by acute metabolic encephalopathy.    Assessment/Plan:   Principal Problem:   Severe sepsis (HCC) Active Problems:   CAP (community acquired pneumonia)    Body mass index is 39.16 kg/m.  (Morbid obesity): This complicates overall care and prognosis   Severe sepsis secondary to community-acquired bilateral lower lobe pneumonia: Continue Omnicef.  No growth on blood cultures.  Delirium/acute metabolic encephalopathy: Fluctuating mental status typical of delirium.  Continue to monitor for improvement.  No acute abnormality on CT head or MRI brain.  Continue Seroquel.  Use IM Haldol as needed for agitation.  Type II DM with hyperglycemia: Patient takes Humulin N 39 units twice daily at home.  Increase Levemir from 20 to 25 units twice daily.  Continue NovoLog as needed for hyperglycemia.  Hypertension: Continue lisinopril  CKD stage IIIa: Creatinine is still around baseline.  S/p recent fall on 04/29/2021: He still has sutures  in the right hand.  PT and OT evaluation.    Diet Order             Diet Heart Room service appropriate? Yes; Fluid consistency: Thin  Diet effective now                      Consultants: None  Procedures: None    Medications:    amLODipine  5 mg Oral Daily   aspirin EC  81 mg Oral Daily   cefdinir  300 mg Oral Q12H   diphenhydrAMINE  12.5 mg Oral QHS   enoxaparin (LOVENOX) injection  55 mg Subcutaneous Daily   guaiFENesin  600 mg Oral BID   influenza vaccine adjuvanted  0.5 mL Intramuscular Tomorrow-1000   insulin aspart  0-15 Units Subcutaneous TID WC   insulin aspart  0-5 Units Subcutaneous QHS   insulin detemir  20 Units Subcutaneous BID AC & HS   lisinopril  10 mg Oral BID   nystatin   Topical BID   pantoprazole  40 mg Oral Daily   pravastatin  20 mg Oral QPM   QUEtiapine  25 mg Oral QHS   tamsulosin  0.4 mg Oral Daily   vitamin B-12  1,000 mcg Oral Daily   vitamin E  200 Units Oral Daily   Continuous Infusions:  diphenhydrAMINE Stopped (05/03/21 0420)     Anti-infectives (From admission, onward)    Start     Dose/Rate Route Frequency Ordered Stop   05/05/21 2200  cefdinir (OMNICEF) capsule 300 mg        300 mg Oral Every 12 hours 05/05/21 1419 05/07/21 2159  05/02/21 2200  cefTRIAXone (ROCEPHIN) 2 g in sodium chloride 0.9 % 100 mL IVPB  Status:  Discontinued        2 g 200 mL/hr over 30 Minutes Intravenous Every 24 hours 05/02/21 0242 05/02/21 0246   05/02/21 2200  cefTRIAXone (ROCEPHIN) 2 g in sodium chloride 0.9 % 100 mL IVPB  Status:  Discontinued        2 g 200 mL/hr over 30 Minutes Intravenous Every 24 hours 05/02/21 0246 05/05/21 1419   05/02/21 0400  azithromycin (ZITHROMAX) 500 mg in sodium chloride 0.9 % 250 mL IVPB  Status:  Discontinued        500 mg 250 mL/hr over 60 Minutes Intravenous Every 24 hours 05/02/21 0242 05/05/21 1419   05/02/21 0030  vancomycin (VANCOCIN) IVPB 1000 mg/200 mL premix        1,000 mg 200 mL/hr over 60  Minutes Intravenous  Once 05/02/21 0027 05/02/21 0243   05/02/21 0030  ceFEPIme (MAXIPIME) 2 g in sodium chloride 0.9 % 100 mL IVPB        2 g 200 mL/hr over 30 Minutes Intravenous  Once 05/02/21 0027 05/02/21 0134   05/01/21 2245  cefTRIAXone (ROCEPHIN) 2 g in sodium chloride 0.9 % 100 mL IVPB  Status:  Discontinued        2 g 200 mL/hr over 30 Minutes Intravenous Every 24 hours 05/01/21 2236 05/02/21 0245              Family Communication/Anticipated D/C date and plan/Code Status   DVT prophylaxis:      Code Status: Full Code  Family Communication: Plan discussed with his daughter, [name], at the bedside Disposition Plan:    Status is: Inpatient  Remains inpatient appropriate because:IV treatments appropriate due to intensity of illness or inability to take PO and Inpatient level of care appropriate due to severity of illness  Dispo: The patient is from: Home              Anticipated d/c is to: Home              Patient currently is not medically stable to d/c.   Difficult to place patient No           Subjective:   Interval events noted.  Patient was more confused and agitated overnight.  His daughter, [name], was at the bedside  Objective:    Vitals:   05/05/21 1524 05/05/21 1939 05/06/21 0519 05/06/21 0803  BP: (!) 165/79 (!) 189/78 (!) 190/81 (!) 169/77  Pulse: 88 81 96 (!) 104  Resp: '18 18 20 18  '$ Temp: 98.6 F (37 C) 98.3 F (36.8 C) 98.3 F (36.8 C) 98.2 F (36.8 C)  TempSrc: Oral Oral Oral Oral  SpO2: 95% 94% 96% 94%  Weight:      Height:       No data found.   Intake/Output Summary (Last 24 hours) at 05/06/2021 1437 Last data filed at 05/06/2021 1351 Gross per 24 hour  Intake 120 ml  Output 250 ml  Net -130 ml   Filed Weights   04/29/21 2316  Weight: 113.4 kg    Exam:  GEN: NAD SKIN: Bruises on the left side of the face EYES: EOMI ENT: MMM CV: RRR PULM: CTA B ABD: soft, ND, NT, +BS CNS: Alert but he is more confused  today. EXT: No edema or tenderness.  Sutures on the right first and third digits.         Data  Reviewed:   I have personally reviewed following labs and imaging studies:  Labs: Labs show the following:   Basic Metabolic Panel: Recent Labs  Lab 04/29/21 2328 05/01/21 2244 05/02/21 0648 05/04/21 0915  NA 137 135 135 137  K 4.0 3.5 4.1 3.8  CL 107 103 104 105  CO2 '22 24 25 24  '$ GLUCOSE 228* 220* 230* 211*  BUN 24* 24* 23 27*  CREATININE 1.48* 1.39* 1.34* 1.53*  CALCIUM 8.9 8.3* 8.3* 8.5*   GFR Estimated Creatinine Clearance: 43.2 mL/min (A) (by C-G formula based on SCr of 1.53 mg/dL (H)). Liver Function Tests: Recent Labs  Lab 04/29/21 2328 05/01/21 2244 05/04/21 0915  AST 30 69* 45*  ALT '18 26 31  '$ ALKPHOS 72 59 61  BILITOT 1.6* 1.3* 0.9  PROT 7.7 6.3* 6.7  ALBUMIN 4.0 3.2* 3.1*   No results for input(s): LIPASE, AMYLASE in the last 168 hours. Recent Labs  Lab 05/04/21 0915  AMMONIA 16   Coagulation profile Recent Labs  Lab 05/01/21 2244 05/02/21 0648  INR 1.1 1.1    CBC: Recent Labs  Lab 04/29/21 2328 05/01/21 2244 05/02/21 0648 05/04/21 0915  WBC 4.8 4.8 4.1 4.8  NEUTROABS  --  3.7  --  3.7  HGB 11.0* 10.0* 10.7* 10.1*  HCT 31.9* 29.2* 30.5* 29.0*  MCV 105.3* 104.3* 103.7* 105.1*  PLT 113* 99* 93* 108*   Cardiac Enzymes: No results for input(s): CKTOTAL, CKMB, CKMBINDEX, TROPONINI in the last 168 hours. BNP (last 3 results) No results for input(s): PROBNP in the last 8760 hours. CBG: Recent Labs  Lab 05/05/21 1237 05/05/21 1625 05/05/21 2036 05/06/21 0804 05/06/21 1152  GLUCAP 221* 159* 173* 113* 164*   D-Dimer: No results for input(s): DDIMER in the last 72 hours. Hgb A1c: No results for input(s): HGBA1C in the last 72 hours. Lipid Profile: No results for input(s): CHOL, HDL, LDLCALC, TRIG, CHOLHDL, LDLDIRECT in the last 72 hours. Thyroid function studies: No results for input(s): TSH, T4TOTAL, T3FREE, THYROIDAB in the  last 72 hours.  Invalid input(s): FREET3 Anemia work up: No results for input(s): VITAMINB12, FOLATE, FERRITIN, TIBC, IRON, RETICCTPCT in the last 72 hours. Sepsis Labs: Recent Labs  Lab 04/29/21 2328 05/01/21 2244 05/02/21 0648 05/04/21 0915  PROCALCITON  --   --  0.22  --   WBC 4.8 4.8 4.1 4.8  LATICACIDVEN  --  1.6 1.4  --     Microbiology Recent Results (from the past 240 hour(s))  Resp Panel by RT-PCR (Flu A&B, Covid) Nasopharyngeal Swab     Status: None   Collection Time: 04/30/21  1:54 AM   Specimen: Nasopharyngeal Swab; Nasopharyngeal(NP) swabs in vial transport medium  Result Value Ref Range Status   SARS Coronavirus 2 by RT PCR NEGATIVE NEGATIVE Final    Comment: (NOTE) SARS-CoV-2 target nucleic acids are NOT DETECTED.  The SARS-CoV-2 RNA is generally detectable in upper respiratory specimens during the acute phase of infection. The lowest concentration of SARS-CoV-2 viral copies this assay can detect is 138 copies/mL. A negative result does not preclude SARS-Cov-2 infection and should not be used as the sole basis for treatment or other patient management decisions. A negative result may occur with  improper specimen collection/handling, submission of specimen other than nasopharyngeal swab, presence of viral mutation(s) within the areas targeted by this assay, and inadequate number of viral copies(<138 copies/mL). A negative result must be combined with clinical observations, patient history, and epidemiological information. The expected result is Negative.  Fact Sheet for Patients:  EntrepreneurPulse.com.au  Fact Sheet for Healthcare Providers:  IncredibleEmployment.be  This test is no t yet approved or cleared by the [place] FDA and  has been authorized for detection and/or diagnosis of SARS-CoV-2 by FDA under an Emergency Use Authorization (EUA). This EUA will remain  in effect (meaning this test can be used) for  the duration of the COVID-19 declaration under Section 564(b)(1) of the Act, 21 U.S.C.section 360bbb-3(b)(1), unless the authorization is terminated  or revoked sooner.       Influenza A by PCR NEGATIVE NEGATIVE Final   Influenza B by PCR NEGATIVE NEGATIVE Final    Comment: (NOTE) The Xpert Xpress SARS-CoV-2/FLU/RSV plus assay is intended as an aid in the diagnosis of influenza from Nasopharyngeal swab specimens and should not be used as a sole basis for treatment. Nasal washings and aspirates are unacceptable for Xpert Xpress SARS-CoV-2/FLU/RSV testing.  Fact Sheet for Patients: EntrepreneurPulse.com.au  Fact Sheet for Healthcare Providers: IncredibleEmployment.be  This test is not yet approved or cleared by the [place] FDA and has been authorized for detection and/or diagnosis of SARS-CoV-2 by FDA under an Emergency Use Authorization (EUA). This EUA will remain in effect (meaning this test can be used) for the duration of the COVID-19 declaration under Section 564(b)(1) of the Act, 21 U.S.C. section 360bbb-3(b)(1), unless the authorization is terminated or revoked.  Performed at [facility], [address], [place], [place] [address]   Urine Culture     Status: Abnormal   Collection Time: 05/01/21 10:48 AM   Specimen: Urine, Random  Result Value Ref Range Status   Specimen Description   Final    URINE, RANDOM Performed at [facility], [address] [place], [place] [address]    Special Requests   Final    NONE Performed at [facility], [place], [place] [address]    Culture 50,000 COLONIES/mL SERRATIA MARCESCENS (A)  Final   Report Status 05/04/2021 FINAL  Final   Organism ID, Bacteria SERRATIA MARCESCENS (A)  Final      Susceptibility   Serratia marcescens - MIC*    CEFAZOLIN >=64 RESISTANT Resistant     CEFEPIME <=0.12 SENSITIVE Sensitive     CEFTRIAXONE <=0.25 SENSITIVE  Sensitive     CIPROFLOXACIN 1 SENSITIVE Sensitive     GENTAMICIN <=1 SENSITIVE Sensitive     NITROFURANTOIN 128 RESISTANT Resistant     TRIMETH/SULFA <=20 SENSITIVE Sensitive     * 50,000 COLONIES/mL SERRATIA MARCESCENS  Blood Culture (routine x 2)     Status: None   Collection Time: 05/01/21 10:44 PM   Specimen: BLOOD  Result Value Ref Range Status   Specimen Description BLOOD LEFT WRIST  Final   Special Requests   Final    BOTTLES DRAWN AEROBIC AND ANAEROBIC Blood Culture adequate volume   Culture   Final    NO GROWTH 5 DAYS Performed at [facility], [address] [place], [place] [address]    Report Status 05/06/2021 FINAL  Final  Blood Culture (routine x 2)     Status: None   Collection Time: 05/01/21 10:45 PM   Specimen: BLOOD  Result Value Ref Range Status   Specimen Description BLOOD RIGHT FOREARM  Final   Special Requests   Final    BOTTLES DRAWN AEROBIC AND ANAEROBIC Blood Culture adequate volume   Culture   Final    NO GROWTH 5 DAYS Performed at [facility], [place]  [address], [place], [place] [address]    Report Status 05/06/2021 FINAL  Final  Resp Panel by RT-PCR (Flu A&B, Covid) Nasopharyngeal Swab     Status: None   Collection Time: 05/02/21 12:20 AM   Specimen: Nasopharyngeal Swab; Nasopharyngeal(NP) swabs in vial transport medium  Result Value Ref Range Status   SARS Coronavirus 2 by RT PCR NEGATIVE NEGATIVE Final    Comment: (NOTE) SARS-CoV-2 target nucleic acids are NOT DETECTED.  The SARS-CoV-2 RNA is generally detectable in upper respiratory specimens during the acute phase of infection. The lowest concentration of SARS-CoV-2 viral copies this assay can detect is 138 copies/mL. A negative result does not preclude SARS-Cov-2 infection and should not be used as the sole basis for treatment or other patient management decisions. A negative result may occur with  improper specimen collection/handling, submission of specimen other than  nasopharyngeal swab, presence of viral mutation(s) within the areas targeted by this assay, and inadequate number of viral copies(<138 copies/mL). A negative result must be combined with clinical observations, patient history, and epidemiological information. The expected result is Negative.  Fact Sheet for Patients:  EntrepreneurPulse.com.au  Fact Sheet for Healthcare Providers:  IncredibleEmployment.be  This test is no t yet approved or cleared by the [place] FDA and  has been authorized for detection and/or diagnosis of SARS-CoV-2 by FDA under an Emergency Use Authorization (EUA). This EUA will remain  in effect (meaning this test can be used) for the duration of the COVID-19 declaration under Section 564(b)(1) of the Act, 21 U.S.C.section 360bbb-3(b)(1), unless the authorization is terminated  or revoked sooner.       Influenza A by PCR NEGATIVE NEGATIVE Final   Influenza B by PCR NEGATIVE NEGATIVE Final    Comment: (NOTE) The Xpert Xpress SARS-CoV-2/FLU/RSV plus assay is intended as an aid in the diagnosis of influenza from Nasopharyngeal swab specimens and should not be used as a sole basis for treatment. Nasal washings and aspirates are unacceptable for Xpert Xpress SARS-CoV-2/FLU/RSV testing.  Fact Sheet for Patients: EntrepreneurPulse.com.au  Fact Sheet for Healthcare Providers: IncredibleEmployment.be  This test is not yet approved or cleared by the [place] FDA and has been authorized for detection and/or diagnosis of SARS-CoV-2 by FDA under an Emergency Use Authorization (EUA). This EUA will remain in effect (meaning this test can be used) for the duration of the COVID-19 declaration under Section 564(b)(1) of the Act, 21 U.S.C. section 360bbb-3(b)(1), unless the authorization is terminated or revoked.  Performed at [facility], [address] [place], [place]  [address]     Procedures and diagnostic studies:  No results found.             LOS: 4 days   Yuvin Bussiere  [facility]   Pager on [email or web address]. If 7PM-7AM, please contact night-coverage at [email or web address]     05/06/2021, 2:37 PM

## 2021-05-06 NOTE — TOC Progression Note (Signed)
Transition of Care [facility]) - Progression Note    Patient Details  Name: Alex Smith MRN: [id number] Date of Birth: [birth year]  Transition of Care [facility]) CM/SW [place], LCSW Phone Number: 05/06/2021, 8:04 AM  Clinical Narrative:   Sent SNFs updated clinicals in the [place].      Barriers to Discharge: No Barriers Identified  Expected Discharge Plan and Services                                                 Social Determinants of Health (SDOH) Interventions    Readmission Risk Interventions No flowsheet data found.

## 2021-05-07 DIAGNOSIS — R4182 Altered mental status, unspecified: Secondary | ICD-10-CM

## 2021-05-07 DIAGNOSIS — A419 Sepsis, unspecified organism: Secondary | ICD-10-CM | POA: Diagnosis not present

## 2021-05-07 DIAGNOSIS — R6520 Severe sepsis without septic shock: Secondary | ICD-10-CM | POA: Diagnosis not present

## 2021-05-07 DIAGNOSIS — J189 Pneumonia, unspecified organism: Secondary | ICD-10-CM | POA: Diagnosis not present

## 2021-05-07 LAB — GLUCOSE, CAPILLARY
Glucose-Capillary: 109 mg/dL — ABNORMAL HIGH (ref 70–99)
Glucose-Capillary: 292 mg/dL — ABNORMAL HIGH (ref 70–99)
Glucose-Capillary: 178 mg/dL — ABNORMAL HIGH (ref 70–99)
Glucose-Capillary: 231 mg/dL — ABNORMAL HIGH (ref 70–99)

## 2021-05-07 NOTE — Progress Notes (Signed)
Physical Therapy Treatment Patient Details Name: Alex Smith MRN: [id number] DOB: [birth year] Today's Date: 05/07/2021   History of Present Illness Pt is an 86 y.o. male with a history of anemia, BPH, CKD, GERD, hypertension, diabetes who presented for evaluation of generalized weakness and fall. Pt had a right finger dislocation and reduction. Per MRI impression "Considerations include minimal subarachnoid hemorrhage and cortical vein thrombosis. Head CT may be helpful." Pt with multiple follow up head CT with impression "Stable appearance of the brain with no acute intracranial pathology".    PT Comments    Pt asleep upon arrival, but awakened with call of name.  Son in room with pt upon arrival.  Son notes that pt is usually an afternoon napper and is much more likely to be engaged in the AM.  Pt was able to participate in bed-level exercises, however quickly fell asleep in between bouts.  Son instructed on some of the exercises that can be performed in order to assist with strengthening of the LE's.  Current discharge plans to SNF remain appropriate at this time.  Pt will continue to benefit from skilled therapy in order to address deficits listed below.    Recommendations for follow up therapy are one component of a multi-disciplinary discharge planning process, led by the attending physician.  Recommendations may be updated based on patient status, additional functional criteria and insurance authorization.  Follow Up Recommendations  SNF;Supervision/Assistance - 24 hour     Equipment Recommendations  None recommended by PT    Recommendations for Other Services       Precautions / Restrictions       Mobility  Bed Mobility               General bed mobility comments: deferred due to pt falling asleep.    Transfers                    Ambulation/Gait                 Stairs             Wheelchair Mobility    Modified Rankin (Stroke Patients  Only)       Balance Overall balance assessment: Needs assistance;History of Falls   Sitting balance-Leahy Scale: Poor Sitting balance - Comments: constant assist to prevent posterior LOB   Standing balance support: Bilateral upper extremity supported;During functional activity Standing balance-Leahy Scale: Poor Standing balance comment: constant assist to prevent posterior LOB                            Cognition Arousal/Alertness: Awake/alert (Pt easily falls asleep and is afternoon napper.) Behavior During Therapy: WFL for tasks assessed/performed Overall Cognitive Status: Impaired/Different from baseline Area of Impairment: Attention;Orientation;Following commands                 Orientation Level: Place;Time;Situation     Following Commands: Follows one step commands inconsistently              Exercises Total Joint Exercises Ankle Circles/Pumps: AROM;Strengthening;Both;20 reps;Supine Quad Sets: AROM;Strengthening;Both;10 reps;Supine Gluteal Sets: AROM;Strengthening;Both;10 reps;Supine Heel Slides: AROM;Strengthening;Both;10 reps;Supine Hip ABduction/ADduction: AROM;Strengthening;Both;10 reps;Supine Straight Leg Raises: AROM;Strengthening;Both;10 reps;Supine    General Comments        Pertinent Vitals/Pain Pain Assessment: No/denies pain    Home Living                      Prior  Function            PT Goals (current goals can now be found in the care plan section) Acute Rehab PT Goals Patient Stated Goal: To walk better PT Goal Formulation: With patient Time For Goal Achievement: 05/14/21 Potential to Achieve Goals: Fair Progress towards PT goals: Progressing toward goals    Frequency    Min 2X/week      PT Plan Current plan remains appropriate    Co-evaluation              AM-PAC PT "6 Clicks" Mobility   Outcome Measure  Help needed turning from your back to your side while in a flat bed without using  bedrails?: A Lot Help needed moving from lying on your back to sitting on the side of a flat bed without using bedrails?: A Lot Help needed moving to and from a bed to a chair (including a wheelchair)?: A Lot Help needed standing up from a chair using your arms (e.g., wheelchair or bedside chair)?: A Lot Help needed to walk in hospital room?: Total Help needed climbing 3-5 steps with a railing? : Total 6 Click Score: 10    End of Session   Activity Tolerance: Patient tolerated treatment well Patient left: in bed;with family/visitor present;with bed alarm set Nurse Communication: Mobility status PT Visit Diagnosis: Unsteadiness on feet (R26.81);History of falling (Z91.81);Difficulty in walking, not elsewhere classified (R26.2);Muscle weakness (generalized) (M62.81)     Time: [id number] PT Time Calculation (min) (ACUTE ONLY): 24 min  Charges:  $Therapeutic Exercise: 23-37 mins                     Gwenlyn Saran, PT, DPT 05/07/21, 3:21 PM    Christie Nottingham 05/07/2021, 3:18 PM

## 2021-05-07 NOTE — TOC Progression Note (Signed)
Transition of Care [facility]) - Progression Note    Patient Details  Name: Alex Smith MRN: [id number] Date of Birth: 14-Nov-1934  Transition of Care [facility]) CM/SW [place], LCSW Phone Number: 05/07/2021, 2:39 PM  Clinical Narrative: [facility] is unable to offer a bed. No other bed offers at this time.      Barriers to Discharge: No Barriers Identified  Expected Discharge Plan and Services                                                 Social Determinants of Health (SDOH) Interventions    Readmission Risk Interventions No flowsheet data found.

## 2021-05-07 NOTE — Progress Notes (Signed)
Progress Note    [name]  [id number] DOB: May 18, 1935  DOA: 04/30/2021 PCP: Leonel Ramsay, MD      Brief Narrative:    Medical records reviewed and are as summarized below:  Alex Smith is a 86 y.o. male with medical history including but not limited to recent accidental/mechanical fall in [place] on 04/29/2021, hypertension, insulin-dependent diabetes mellitus type 2, morbid obesity, BPH, GERD, CKD stage III hyperlipidemia, history of pneumonia, history of squamous cell carcinoma in situ, history of basal cell carcinoma of face, vitamin B12 deficiency, colonic polyps.  He presented to [facility] hospital because of generalized weakness, another mechanical fall at home and generalized body pain.  He was febrile in the emergency room with temperature of 101.4 F.  CT abdomen pelvis showed bibasilar infiltrates concerning for pneumonia.  He was admitted to the hospital for severe sepsis secondary to community-acquired pneumonia complicated by acute metabolic encephalopathy.    Assessment/Plan:   Principal Problem:   Severe sepsis (HCC) Active Problems:   CAP (community acquired pneumonia)    Body mass index is 39.16 kg/m.  (Morbid obesity): This complicates overall care and prognosis   Severe sepsis secondary to community-acquired bilateral lower lobe pneumonia: Plan to complete Omnicef today.  No growth on blood cultures.  Delirium/acute metabolic encephalopathy: Consulted neurologist today because of persistent confusion. Continue Seroquel.  Use IM Haldol as needed for agitation.  Type II DM with hyperglycemia: Patient takes Humulin N 39 units twice daily at home.  Continue Levemir 25 units twice daily. Continue NovoLog as needed for hyperglycemia.  Hypertension: Continue lisinopril  CKD stage IIIa: Creatinine is still around baseline.  S/p recent fall on 04/29/2021: He still has sutures in the right hand.  Plan to remove stitches on 04/08/2021 (he was told  to have stitches in place for at least 10 days)    Diet Order             Diet Heart Room service appropriate? Yes; Fluid consistency: Thin  Diet effective now                      Consultants: Neurologist  Procedures: None    Medications:    amLODipine  5 mg Oral Daily   aspirin EC  81 mg Oral Daily   diphenhydrAMINE  12.5 mg Oral QHS   enoxaparin (LOVENOX) injection  55 mg Subcutaneous Daily   guaiFENesin  600 mg Oral BID   influenza vaccine adjuvanted  0.5 mL Intramuscular Tomorrow-1000   insulin aspart  0-15 Units Subcutaneous TID WC   insulin aspart  0-5 Units Subcutaneous QHS   insulin detemir  20 Units Subcutaneous BID AC & HS   lisinopril  10 mg Oral BID   nystatin   Topical BID   pantoprazole  40 mg Oral Daily   pravastatin  20 mg Oral QPM   QUEtiapine  25 mg Oral QHS   tamsulosin  0.4 mg Oral Daily   vitamin B-12  1,000 mcg Oral Daily   vitamin E  200 Units Oral Daily   Continuous Infusions:  diphenhydrAMINE Stopped (05/03/21 0420)     Anti-infectives (From admission, onward)    Start     Dose/Rate Route Frequency Ordered Stop   05/05/21 2200  cefdinir (OMNICEF) capsule 300 mg        300 mg Oral Every 12 hours 05/05/21 1419 05/07/21 0942   05/02/21 2200  cefTRIAXone (ROCEPHIN) 2 g in sodium chloride  0.9 % 100 mL IVPB  Status:  Discontinued        2 g 200 mL/hr over 30 Minutes Intravenous Every 24 hours 05/02/21 0242 05/02/21 0246   05/02/21 2200  cefTRIAXone (ROCEPHIN) 2 g in sodium chloride 0.9 % 100 mL IVPB  Status:  Discontinued        2 g 200 mL/hr over 30 Minutes Intravenous Every 24 hours 05/02/21 0246 05/05/21 1419   05/02/21 0400  azithromycin (ZITHROMAX) 500 mg in sodium chloride 0.9 % 250 mL IVPB  Status:  Discontinued        500 mg 250 mL/hr over 60 Minutes Intravenous Every 24 hours 05/02/21 0242 05/05/21 1419   05/02/21 0030  vancomycin (VANCOCIN) IVPB 1000 mg/200 mL premix        1,000 mg 200 mL/hr over 60 Minutes Intravenous   Once 05/02/21 0027 05/02/21 0243   05/02/21 0030  ceFEPIme (MAXIPIME) 2 g in sodium chloride 0.9 % 100 mL IVPB        2 g 200 mL/hr over 30 Minutes Intravenous  Once 05/02/21 0027 05/02/21 0134   05/01/21 2245  cefTRIAXone (ROCEPHIN) 2 g in sodium chloride 0.9 % 100 mL IVPB  Status:  Discontinued        2 g 200 mL/hr over 30 Minutes Intravenous Every 24 hours 05/01/21 2236 05/02/21 0245              Family Communication/Anticipated D/C date and plan/Code Status   DVT prophylaxis:      Code Status: Full Code  Family Communication: Plan discussed with Doug, son, at the bedside Disposition Plan:    Status is: Inpatient  Remains inpatient appropriate because:IV treatments appropriate due to intensity of illness or inability to take PO and Inpatient level of care appropriate due to severity of illness  Dispo: The patient is from: Home              Anticipated d/c is to: Home              Patient currently is not medically stable to d/c.   Difficult to place patient No           Subjective:   Interval events noted.  Patient is still confused.  He was talking about always making her way with thousands of dollars worth of guns. His son, Alex Smith, was at the bedside.  Objective:    Vitals:   05/06/21 1649 05/06/21 2122 05/07/21 0509 05/07/21 0731  BP: (!) 152/64 (!) 147/66 (!) 167/79 120/74  Pulse: 85 90 85 80  Resp: '18 20 20 18  '$ Temp: 98.1 F (36.7 C) 98.4 F (36.9 C) 98 F (36.7 C) 98.9 F (37.2 C)  TempSrc: Oral Oral Oral   SpO2: 94% 97% 100% 98%  Weight:      Height:       No data found.   Intake/Output Summary (Last 24 hours) at 05/07/2021 1403 Last data filed at 05/07/2021 1001 Gross per 24 hour  Intake 360 ml  Output 0 ml  Net 360 ml   Filed Weights   04/29/21 2316  Weight: 113.4 kg    Exam:  GEN: NAD SKIN: No rash EYES: EOMI ENT: MMM CV: RRR PULM: CTA B ABD: soft, ND, NT, +BS CNS: AAO x 2 (person and place), confused, non  focal EXT: No edema or tenderness.  Sutures on the right first and third digits.         Data Reviewed:   I have  personally reviewed following labs and imaging studies:  Labs: Labs show the following:   Basic Metabolic Panel: Recent Labs  Lab 05/01/21 2244 05/02/21 0648 05/04/21 0915  NA 135 135 137  K 3.5 4.1 3.8  CL 103 104 105  CO2 '24 25 24  '$ GLUCOSE 220* 230* 211*  BUN 24* 23 27*  CREATININE 1.39* 1.34* 1.53*  CALCIUM 8.3* 8.3* 8.5*   GFR Estimated Creatinine Clearance: 43.2 mL/min (A) (by C-G formula based on SCr of 1.53 mg/dL (H)). Liver Function Tests: Recent Labs  Lab 05/01/21 2244 05/04/21 0915  AST 69* 45*  ALT 26 31  ALKPHOS 59 61  BILITOT 1.3* 0.9  PROT 6.3* 6.7  ALBUMIN 3.2* 3.1*   No results for input(s): LIPASE, AMYLASE in the last 168 hours. Recent Labs  Lab 05/04/21 0915  AMMONIA 16   Coagulation profile Recent Labs  Lab 05/01/21 2244 05/02/21 0648  INR 1.1 1.1    CBC: Recent Labs  Lab 05/01/21 2244 05/02/21 0648 05/04/21 0915  WBC 4.8 4.1 4.8  NEUTROABS 3.7  --  3.7  HGB 10.0* 10.7* 10.1*  HCT 29.2* 30.5* 29.0*  MCV 104.3* 103.7* 105.1*  PLT 99* 93* 108*   Cardiac Enzymes: No results for input(s): CKTOTAL, CKMB, CKMBINDEX, TROPONINI in the last 168 hours. BNP (last 3 results) No results for input(s): PROBNP in the last 8760 hours. CBG: Recent Labs  Lab 05/06/21 1152 05/06/21 1652 05/06/21 2117 05/07/21 0801 05/07/21 1137  GLUCAP 164* 168* 147* 109* 292*   D-Dimer: No results for input(s): DDIMER in the last 72 hours. Hgb A1c: No results for input(s): HGBA1C in the last 72 hours. Lipid Profile: No results for input(s): CHOL, HDL, LDLCALC, TRIG, CHOLHDL, LDLDIRECT in the last 72 hours. Thyroid function studies: No results for input(s): TSH, T4TOTAL, T3FREE, THYROIDAB in the last 72 hours.  Invalid input(s): FREET3 Anemia work up: No results for input(s): VITAMINB12, FOLATE, FERRITIN, TIBC, IRON, RETICCTPCT  in the last 72 hours. Sepsis Labs: Recent Labs  Lab 05/01/21 2244 05/02/21 0648 05/04/21 0915  PROCALCITON  --  0.22  --   WBC 4.8 4.1 4.8  LATICACIDVEN 1.6 1.4  --     Microbiology Recent Results (from the past 240 hour(s))  Resp Panel by RT-PCR (Flu A&B, Covid) Nasopharyngeal Swab     Status: None   Collection Time: 04/30/21  1:54 AM   Specimen: Nasopharyngeal Swab; Nasopharyngeal(NP) swabs in vial transport medium  Result Value Ref Range Status   SARS Coronavirus 2 by RT PCR NEGATIVE NEGATIVE Final    Comment: (NOTE) SARS-CoV-2 target nucleic acids are NOT DETECTED.  The SARS-CoV-2 RNA is generally detectable in upper respiratory specimens during the acute phase of infection. The lowest concentration of SARS-CoV-2 viral copies this assay can detect is 138 copies/mL. A negative result does not preclude SARS-Cov-2 infection and should not be used as the sole basis for treatment or other patient management decisions. A negative result may occur with  improper specimen collection/handling, submission of specimen other than nasopharyngeal swab, presence of viral mutation(s) within the areas targeted by this assay, and inadequate number of viral copies(<138 copies/mL). A negative result must be combined with clinical observations, patient history, and epidemiological information. The expected result is Negative.  Fact Sheet for Patients:  EntrepreneurPulse.com.au  Fact Sheet for Healthcare Providers:  IncredibleEmployment.be  This test is no t yet approved or cleared by the [place] FDA and  has been authorized for detection and/or diagnosis of SARS-CoV-2 by FDA  under an Emergency Use Authorization (EUA). This EUA will remain  in effect (meaning this test can be used) for the duration of the COVID-19 declaration under Section 564(b)(1) of the Act, 21 U.S.C.section 360bbb-3(b)(1), unless the authorization is terminated  or revoked  sooner.       Influenza A by PCR NEGATIVE NEGATIVE Final   Influenza B by PCR NEGATIVE NEGATIVE Final    Comment: (NOTE) The Xpert Xpress SARS-CoV-2/FLU/RSV plus assay is intended as an aid in the diagnosis of influenza from Nasopharyngeal swab specimens and should not be used as a sole basis for treatment. Nasal washings and aspirates are unacceptable for Xpert Xpress SARS-CoV-2/FLU/RSV testing.  Fact Sheet for Patients: EntrepreneurPulse.com.au  Fact Sheet for Healthcare Providers: IncredibleEmployment.be  This test is not yet approved or cleared by the [place] FDA and has been authorized for detection and/or diagnosis of SARS-CoV-2 by FDA under an Emergency Use Authorization (EUA). This EUA will remain in effect (meaning this test can be used) for the duration of the COVID-19 declaration under Section 564(b)(1) of the Act, 21 U.S.C. section 360bbb-3(b)(1), unless the authorization is terminated or revoked.  Performed at [facility], [address], [place], [place] [address]   Urine Culture     Status: Abnormal   Collection Time: 05/01/21 10:48 AM   Specimen: Urine, Random  Result Value Ref Range Status   Specimen Description   Final    URINE, RANDOM Performed at [facility], [address] [place], [place] [address]    Special Requests   Final    NONE Performed at [facility], [place], [place] [address]    Culture 50,000 COLONIES/mL SERRATIA MARCESCENS (A)  Final   Report Status 05/04/2021 FINAL  Final   Organism ID, Bacteria SERRATIA MARCESCENS (A)  Final      Susceptibility   Serratia marcescens - MIC*    CEFAZOLIN >=64 RESISTANT Resistant     CEFEPIME <=0.12 SENSITIVE Sensitive     CEFTRIAXONE <=0.25 SENSITIVE Sensitive     CIPROFLOXACIN 1 SENSITIVE Sensitive     GENTAMICIN <=1 SENSITIVE Sensitive     NITROFURANTOIN 128 RESISTANT Resistant     TRIMETH/SULFA <=20  SENSITIVE Sensitive     * 50,000 COLONIES/mL SERRATIA MARCESCENS  Blood Culture (routine x 2)     Status: None   Collection Time: 05/01/21 10:44 PM   Specimen: BLOOD  Result Value Ref Range Status   Specimen Description BLOOD LEFT WRIST  Final   Special Requests   Final    BOTTLES DRAWN AEROBIC AND ANAEROBIC Blood Culture adequate volume   Culture   Final    NO GROWTH 5 DAYS Performed at [facility], [address] [place], [place] [address]    Report Status 05/06/2021 FINAL  Final  Blood Culture (routine x 2)     Status: None   Collection Time: 05/01/21 10:45 PM   Specimen: BLOOD  Result Value Ref Range Status   Specimen Description BLOOD RIGHT FOREARM  Final   Special Requests   Final    BOTTLES DRAWN AEROBIC AND ANAEROBIC Blood Culture adequate volume   Culture   Final    NO GROWTH 5 DAYS Performed at [facility], [address], [place], [place] [address]    Report Status 05/06/2021 FINAL  Final  Resp Panel by RT-PCR (Flu A&B, Covid) Nasopharyngeal Swab     Status: None   Collection Time: 05/02/21 12:20 AM   Specimen: Nasopharyngeal Swab;  Nasopharyngeal(NP) swabs in vial transport medium  Result Value Ref Range Status   SARS Coronavirus 2 by RT PCR NEGATIVE NEGATIVE Final    Comment: (NOTE) SARS-CoV-2 target nucleic acids are NOT DETECTED.  The SARS-CoV-2 RNA is generally detectable in upper respiratory specimens during the acute phase of infection. The lowest concentration of SARS-CoV-2 viral copies this assay can detect is 138 copies/mL. A negative result does not preclude SARS-Cov-2 infection and should not be used as the sole basis for treatment or other patient management decisions. A negative result may occur with  improper specimen collection/handling, submission of specimen other than nasopharyngeal swab, presence of viral mutation(s) within the areas targeted by this assay, and inadequate number of viral copies(<138 copies/mL). A negative  result must be combined with clinical observations, patient history, and epidemiological information. The expected result is Negative.  Fact Sheet for Patients:  EntrepreneurPulse.com.au  Fact Sheet for Healthcare Providers:  IncredibleEmployment.be  This test is no t yet approved or cleared by the [place] FDA and  has been authorized for detection and/or diagnosis of SARS-CoV-2 by FDA under an Emergency Use Authorization (EUA). This EUA will remain  in effect (meaning this test can be used) for the duration of the COVID-19 declaration under Section 564(b)(1) of the Act, 21 U.S.C.section 360bbb-3(b)(1), unless the authorization is terminated  or revoked sooner.       Influenza A by PCR NEGATIVE NEGATIVE Final   Influenza B by PCR NEGATIVE NEGATIVE Final    Comment: (NOTE) The Xpert Xpress SARS-CoV-2/FLU/RSV plus assay is intended as an aid in the diagnosis of influenza from Nasopharyngeal swab specimens and should not be used as a sole basis for treatment. Nasal washings and aspirates are unacceptable for Xpert Xpress SARS-CoV-2/FLU/RSV testing.  Fact Sheet for Patients: EntrepreneurPulse.com.au  Fact Sheet for Healthcare Providers: IncredibleEmployment.be  This test is not yet approved or cleared by the [place] FDA and has been authorized for detection and/or diagnosis of SARS-CoV-2 by FDA under an Emergency Use Authorization (EUA). This EUA will remain in effect (meaning this test can be used) for the duration of the COVID-19 declaration under Section 564(b)(1) of the Act, 21 U.S.C. section 360bbb-3(b)(1), unless the authorization is terminated or revoked.  Performed at [facility], [address] [place], [place] [address]     Procedures and diagnostic studies:  No results found.             LOS: 5 days   Takiesha Mcdevitt  [facility]   Pager on  [email or web address]. If 7PM-7AM, please contact night-coverage at [email or web address]     05/07/2021, 2:03 PM

## 2021-05-07 NOTE — Consult Note (Addendum)
NEURO HOSPITALIST CONSULT NOTE   Requestig physician: Dr. Mal Misty  Reason for Consult: Delirium  History obtained from:  Chart     HPI:                                                                                                                                          Alex Smith is an 86 y.o. 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 When he came home his daughter had noticed that he was extremely weak; daughter was unable to hold or assist him at home. The patient was feeling sore all over. He had no specific localized neurological deficits.  He also had been having memory deficits in addition to progressive weakness. On initial evaluation by Hospitalist service at [facility], the patient was fairly confused and was only alert to his name.  He required Ativan, Haldol and Versed for significant agitation.   Since admission, the patient was diagnosed with severe sepsis secondary to community-acquired bilateral lower lobe pneumonia. He has been on Omnicef. His blood cultures have been negative. He continues to be treated with medication for what per Hospitalist team is delirium vs acute metabolic encephalopathy, currently on Seroquel.   Past Medical History:  Diagnosis Date   Anemia    B12 deficiency    Basal cell carcinoma 03/30/2018   left ant nasal ala   Basal cell carcinoma of face 01/19/2013   right distal dorsum nose   BPH (benign prostatic hypertrophy)    followed by urology, discharged (Dr. Bernardo Heater)   CAP (community acquired pneumonia) 02/15/2015   CKD stage 3 due to type 2 diabetes mellitus ([place]) 11/14/2017   Colon  polyps    Diverticulosis    Dysplastic nevus 10/27/2006   right med calf   GERD (gastroesophageal reflux disease)    Hairy cell leukemia ([place]) 2006   recurrent, seizure on rituxan, now on cladribine [name])   History of pneumonia 2000's   "once" (07/07/2012)   History of shingles    HLD (hyperlipidemia)    Hypertension    Pneumonia    Shortness of breath dyspnea    Squamous cell carcinoma in situ 07/05/2020   Left lat. pretibial. 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 EDC 09/07/2020   Squamous cell carcinoma of skin 04/03/2020   Left cheek. WD SCC   Systolic murmur 87/57/9728   Type 2 diabetes, controlled, with  retinopathy ([place])     Past Surgical History:  Procedure Laterality Date   25 GAUGE PARS PLANA VITRECTOMY WITH 20 GAUGE MVR PORT FOR MACULAR HOLE  07/07/2012   Procedure: 25 GAUGE PARS PLANA VITRECTOMY WITH 20 GAUGE MVR PORT FOR MACULAR HOLE;  Surgeon: Hayden Pedro, MD;  Location: [employer];  Service: Ophthalmology;  Laterality: Left;   BONE MARROW BIOPSY  2016   CARDIOVASCULAR STRESS TEST  2013   treadmill - no evidence ischemia, EF 61%   CATARACT EXTRACTION W/ INTRAOCULAR LENS  IMPLANT, BILATERAL  ~ 2010   COLONOSCOPY  2014   [place] WNL no rpt needed, h/o polyps   EYE SURGERY Left 06/2012   laser surgery   GAS INSERTION  07/07/2012   Procedure: INSERTION OF GAS;  Surgeon: Hayden Pedro, MD;  Location: [place];  Service: Ophthalmology;  Laterality: Left;  C3F8   PERIPHERAL VASCULAR CATHETERIZATION N/A 02/23/2015   Procedure: Glori Luis Cath Insertion;  Surgeon: Algernon Huxley, MD;  Location: [place] [facility];  Service: Cardiovascular;  Laterality: N/A;   PORTA CATH REMOVAL N/A 02/16/2020   Procedure: PORTA CATH REMOVAL;  Surgeon: Algernon Huxley, MD;  Location: [place] [facility];  Service: Cardiovascular;  Laterality: N/A;   SERUM PATCH  07/07/2012   Procedure: SERUM PATCH;  Surgeon: Hayden Pedro, MD;  Location: [place];   Service: Ophthalmology;  Laterality: Left;   SKIN CANCER EXCISION     "all over my face" (07/07/2012)    Family History  Problem Relation Age of Onset   Dementia Mother    Heart failure Father 43   Cancer Sister        breast   Diabetes Paternal Uncle    Diabetes Paternal Aunt    CAD Brother 46       MI   Stroke Neg Hx               Social History:  reports that he quit smoking about 52 years ago. His smoking use included cigarettes. He has a 50.00 pack-year smoking history. He has never used smokeless tobacco. He reports current alcohol use. He reports that he does not use drugs.  Allergies  Allergen Reactions   Rituximab Rash    Chest tightness Chest tightness   Blood-Group Specific Substance Other (See Comments)    Had a post transfusion reaction of red blood cells; NOW REQUIRES WASHED BLOOD CELLS Had a post transfusion reaction of red blood cells; NOW REQUIRES WASHED BLOOD CELLS   Primaxin [Imipenem] Other (See Comments)    Possible allergy Possible allergy   Voriconazole Other (See Comments)   Sulfa Antibiotics Itching and Rash   Sulfacetamide Sodium Itching and Rash    MEDICATIONS:                                                                                                                     Scheduled:  amLODipine  5 mg Oral Daily   aspirin EC  81 mg  Oral Daily   diphenhydrAMINE  12.5 mg Oral QHS   enoxaparin (LOVENOX) injection  55 mg Subcutaneous Daily   guaiFENesin  600 mg Oral BID   influenza vaccine adjuvanted  0.5 mL Intramuscular Tomorrow-1000   insulin aspart  0-15 Units Subcutaneous TID WC   insulin aspart  0-5 Units Subcutaneous QHS   insulin detemir  20 Units Subcutaneous BID AC & HS   lisinopril  10 mg Oral BID   nystatin   Topical BID   pantoprazole  40 mg Oral Daily   pravastatin  20 mg Oral QPM   QUEtiapine  25 mg Oral QHS   tamsulosin  0.4 mg Oral Daily   vitamin B-12  1,000 mcg Oral Daily   vitamin E  200 Units Oral Daily    Continuous:  diphenhydrAMINE Stopped (05/03/21 0420)     ROS:                                                                                                                                       Unable to obtain due to AMS.   Blood pressure (!) 142/76, pulse 85, temperature 98.9 F (37.2 C), resp. rate 18, height 5' 7"  (1.702 m), weight 113.4 kg, SpO2 100 %.   General Examination:                                                                                                       Physical Exam  HEENT-  Evidence for recent trauma; bruising to left forehead and cheek Lungs: Respirations unlabored  Extremities- No edema  Neurological Examination Mental Status: Awake and alert. Speech fluent but at times nonsensical or not relevant to questions asked. Able to name a thumb and pinky finger but not index finger. Able to follow simple commands and answer simple questions. Tangentiality noted. Non-agitated. Oriented to the day of the week and year, but not the month. Did not know the city. Did know the state. Had to be reminded that he was in the hospital. Does not recall his age. Able to add 5+3 but multiplication is impaired. Concentration impaired: able to spell WORLD forwards but not backwards. Knows the current president, cannot recall the name of the previous president. Not agitated. Does not appear to be responding to internal stimuli. No somnolence or waxing/waning of mentation.  Cranial Nerves: II: Tracks and fixates normally. Pupils equal and reactive.   III,IV, VI: EOMI. No nystagmus.  V: Reacts to tactile stimuli  VII: Smile symmetric.  VIII: Hearing intact to voice IX,X: No hypophonia XI: Symmetric XII: No lingual dysarthria Motor: Right : Upper extremity   5/5    Left:     Upper extremity   5/5  Lower extremity   5/5     Lower extremity   5/5 Tone and bulk:normal tone throughout; no atrophy noted Sensory: Intact to FT x 4.  Deep Tendon Reflexes: 1+ bilateral  brachioradialis. Does not relax lower extremities sufficiently to elicit reflexes.  Cerebellar: No ataxia with FNF bilaterally  Gait: Deferred   Lab Results: Basic Metabolic Panel: Recent Labs  Lab 05/01/21 2244 05/02/21 0648 05/04/21 0915  NA 135 135 137  K 3.5 4.1 3.8  CL 103 104 105  CO2 24 25 24   GLUCOSE 220* 230* 211*  BUN 24* 23 27*  CREATININE 1.39* 1.34* 1.53*  CALCIUM 8.3* 8.3* 8.5*    CBC: Recent Labs  Lab 05/01/21 2244 05/02/21 0648 05/04/21 0915  WBC 4.8 4.1 4.8  NEUTROABS 3.7  --  3.7  HGB 10.0* 10.7* 10.1*  HCT 29.2* 30.5* 29.0*  MCV 104.3* 103.7* 105.1*  PLT 99* 93* 108*    Cardiac Enzymes: No results for input(s): CKTOTAL, CKMB, CKMBINDEX, TROPONINI in the last 168 hours.  Lipid Panel: No results for input(s): CHOL, TRIG, HDL, CHOLHDL, VLDL, LDLCALC in the last 168 hours.  Imaging: No results found.   Assessment: 86 year old male with confusion since a fall 1. MRI brain (9/12): Curvilinear possible abnormal signal in the right frontal lobe contiguous with a cortical vein. Considerations include minimal subarachnoid hemorrhage and cortical vein thrombosis. Head CT may be helpful. 2. CT head (9/13): Stable appearance of the brain with no acute intracranial pathology 3. Exam reveals an awake and cooperative but confused patient without agitation, waxing/waning mentation or changes in level of consciousness. No seizure like-activity noted. No asterixis or tremor seen to suggest a metabolic encephalopathy.  4. Imaging above is not revealing regarding an etiology for the patient's confusion.  5. Overall clinical picture is most consistent with a metabolic encephalopathy. Benadryl and seroquel may also be contributing. His sepsis/PNA may have triggered the initial worsening of his mental status.   Recommendations: 1. Eliminate Benadryl. If sleep medication is needed, QHS Seroquel is a better option.  2. Maintain diurnal cycle: Lights on during the day  and OOB to chair with posy vest as needed. Lights and TV off after 9 PM. Reorient frequently.  3. If a supervised activity with a volunteer such as painting or drawing is available, this may improve his orientation and behavior.  4. If he exhibits any jerking or twitching, obtain an EEG.  5. Ambulate daily with PT.   Electronically signed: Dr. Kerney Elbe 05/07/2021, 7:17 PM

## 2021-05-08 DIAGNOSIS — A419 Sepsis, unspecified organism: Secondary | ICD-10-CM | POA: Diagnosis not present

## 2021-05-08 DIAGNOSIS — R6520 Severe sepsis without septic shock: Secondary | ICD-10-CM | POA: Diagnosis not present

## 2021-05-08 DIAGNOSIS — J189 Pneumonia, unspecified organism: Secondary | ICD-10-CM | POA: Diagnosis not present

## 2021-05-08 LAB — CBC WITH DIFFERENTIAL/PLATELET
Abs Immature Granulocytes: 0.01 10*3/uL (ref 0.00–0.07)
Basophils Absolute: 0 10*3/uL (ref 0.0–0.1)
Basophils Relative: 1 %
Eosinophils Absolute: 0.2 10*3/uL (ref 0.0–0.5)
Eosinophils Relative: 4 %
HCT: 31.3 % — ABNORMAL LOW (ref 39.0–52.0)
Hemoglobin: 10.5 g/dL — ABNORMAL LOW (ref 13.0–17.0)
Immature Granulocytes: 0 %
Lymphocytes Relative: 17 %
Lymphs Abs: 0.7 10*3/uL (ref 0.7–4.0)
MCH: 35.4 pg — ABNORMAL HIGH (ref 26.0–34.0)
MCHC: 33.5 g/dL (ref 30.0–36.0)
MCV: 105.4 fL — ABNORMAL HIGH (ref 80.0–100.0)
Monocytes Absolute: 0.2 10*3/uL (ref 0.1–1.0)
Monocytes Relative: 6 %
Neutro Abs: 2.9 10*3/uL (ref 1.7–7.7)
Neutrophils Relative %: 72 %
Platelets: 120 10*3/uL — ABNORMAL LOW (ref 150–400)
RBC: 2.97 MIL/uL — ABNORMAL LOW (ref 4.22–5.81)
RDW: 14.6 % (ref 11.5–15.5)
WBC: 4 10*3/uL (ref 4.0–10.5)
nRBC: 0 % (ref 0.0–0.2)

## 2021-05-08 LAB — BASIC METABOLIC PANEL
Anion gap: 7 (ref 5–15)
BUN: 25 mg/dL — ABNORMAL HIGH (ref 8–23)
CO2: 27 mmol/L (ref 22–32)
Calcium: 8.7 mg/dL — ABNORMAL LOW (ref 8.9–10.3)
Chloride: 107 mmol/L (ref 98–111)
Creatinine, Ser: 1.51 mg/dL — ABNORMAL HIGH (ref 0.61–1.24)
GFR, Estimated: 45 mL/min — ABNORMAL LOW (ref >60)
Glucose, Bld: 116 mg/dL — ABNORMAL HIGH (ref 70–99)
Potassium: 3.9 mmol/L (ref 3.5–5.1)
Sodium: 141 mmol/L (ref 135–145)

## 2021-05-08 LAB — GLUCOSE, CAPILLARY
Glucose-Capillary: 109 mg/dL — ABNORMAL HIGH (ref 70–99)
Glucose-Capillary: 199 mg/dL — ABNORMAL HIGH (ref 70–99)
Glucose-Capillary: 221 mg/dL — ABNORMAL HIGH (ref 70–99)
Glucose-Capillary: 249 mg/dL — ABNORMAL HIGH (ref 70–99)

## 2021-05-08 LAB — PHOSPHORUS: Phosphorus: 3.8 mg/dL (ref 2.5–4.6)

## 2021-05-08 LAB — MAGNESIUM: Magnesium: 2.5 mg/dL — ABNORMAL HIGH (ref 1.7–2.4)

## 2021-05-08 MED ORDER — INSULIN DETEMIR 100 UNIT/ML ~~LOC~~ SOLN
15.0000 [IU] | Freq: Two times a day (BID) | SUBCUTANEOUS | Status: DC
  Administered 2021-05-08 – 2021-05-11 (×7): 15 [IU] via SUBCUTANEOUS
  Filled 2021-05-08 (×8): qty 0.15

## 2021-05-08 MED ORDER — QUETIAPINE FUMARATE 25 MG PO TABS
50.0000 mg | ORAL_TABLET | Freq: Every day | ORAL | Status: DC
  Administered 2021-05-08 – 2021-05-10 (×3): 50 mg via ORAL
  Filled 2021-05-08 (×3): qty 2

## 2021-05-08 NOTE — TOC Progression Note (Addendum)
Transition of Care [facility]) - Progression Note    Patient Details  Name: DEION SWIFT MRN: [id number] Date of Birth: 08/07/1935  [facility] CM/SW [facility], LCSW Phone Number: 05/08/2021, 11:39 AM  Clinical Narrative:  Left voicemail for son. Will see if he has any other SNF preferences when he calls back.   12:43 pm: Received call back from patient's son, Cylan Borum [phone]). They are interested in potential ALF placement after rehab. Patient's wife is on the memory care side at [facility]. With son's permission, made referral to [facility] to assist with placement and financial questions. Notified son that [facility] unable to accept bed at this time. He's hoping by the time patient's mental status starts to improve, they will be able to reconsider. Asked him to go ahead and choose second preference in case they can't.    Barriers to Discharge: No Barriers Identified  Expected Discharge Plan and Services                                                 Social Determinants of Health (SDOH) Interventions    Readmission Risk Interventions No flowsheet data found.

## 2021-05-08 NOTE — Plan of Care (Signed)
  Problem: Clinical Measurements: Goal: Will remain free from infection Outcome: Progressing Goal: Diagnostic test results will improve Outcome: Progressing Goal: Respiratory complications will improve Outcome: Progressing Goal: Cardiovascular complication will be avoided Outcome: Progressing   Problem: Pain Managment: Goal: General experience of comfort will improve Outcome: Progressing   Problem: Safety: Goal: Ability to remain free from injury will improve Outcome: Progressing   Pt is alert but very confused. Oriented to person only. V/S stable. Denies any pain or SOB. Tele-sitter on the whole night for safety.

## 2021-05-08 NOTE — Progress Notes (Signed)
Progress Note    Alex Smith  KZS:[id number] DOB: October 09, 1934  DOA: 04/30/2021 PCP: Leonel Ramsay, MD      Brief Narrative:    Medical records reviewed and are as summarized below:  Alex Smith is a 86 y.o. male with medical history including but not limited to recent accidental/mechanical fall in [place] on 04/29/2021, hypertension, insulin-dependent diabetes mellitus type 2, morbid obesity, BPH, GERD, CKD stage III hyperlipidemia, history of pneumonia, history of squamous cell carcinoma in situ, history of basal cell carcinoma of face, vitamin B12 deficiency, colonic polyps.  He presented to [facility] hospital because of generalized weakness, another mechanical fall at home and generalized body pain.  He was febrile in the emergency room with temperature of 101.4 F.  CT abdomen pelvis showed bibasilar infiltrates concerning for pneumonia.  He was admitted to the hospital for severe sepsis secondary to community-acquired pneumonia complicated by acute metabolic encephalopathy.    Assessment/Plan:   Principal Problem:   Severe sepsis (HCC) Active Problems:   CAP (community acquired pneumonia)    Body mass index is 39.16 kg/m.  (Morbid obesity): This complicates overall care and prognosis   Severe sepsis secondary to community-acquired bilateral lower lobe pneumonia: Completed 7-day course of antibiotics on 05/07/2021.  No growth on blood cultures.  Delirium/acute metabolic encephalopathy: Appreciate input from neurologist.  Continue Seroquel at night.  Use IM Haldol as needed for agitation.  Type II DM with hyperglycemia: Patient takes Humulin N 39 units twice daily at home.  Decrease Levemir from 20 to 15 units twice daily.  Continue NovoLog as needed for hyperglycemia.  Hypertension: Continue lisinopril  CKD stage IIIa: Creatinine is stable around baseline.  S/p recent fall on 04/29/2021: He still has sutures in the right hand.  Plan to remove stitches in 1 to  2 days.   Diet Order             Diet Heart Room service appropriate? Yes; Fluid consistency: Thin  Diet effective now                      Consultants: Neurologist  Procedures: None    Medications:    amLODipine  5 mg Oral Daily   aspirin EC  81 mg Oral Daily   enoxaparin (LOVENOX) injection  55 mg Subcutaneous Daily   guaiFENesin  600 mg Oral BID   insulin aspart  0-15 Units Subcutaneous TID WC   insulin aspart  0-5 Units Subcutaneous QHS   insulin detemir  15 Units Subcutaneous BID   lisinopril  10 mg Oral BID   nystatin   Topical BID   pantoprazole  40 mg Oral Daily   pravastatin  20 mg Oral QPM   QUEtiapine  50 mg Oral QHS   tamsulosin  0.4 mg Oral Daily   vitamin B-12  1,000 mcg Oral Daily   vitamin E  200 Units Oral Daily   Continuous Infusions:  diphenhydrAMINE Stopped (05/03/21 0420)     Anti-infectives (From admission, onward)    Start     Dose/Rate Route Frequency Ordered Stop   05/05/21 2200  cefdinir (OMNICEF) capsule 300 mg        300 mg Oral Every 12 hours 05/05/21 1419 05/07/21 0942   05/02/21 2200  cefTRIAXone (ROCEPHIN) 2 g in sodium chloride 0.9 % 100 mL IVPB  Status:  Discontinued        2 g 200 mL/hr over 30 Minutes Intravenous Every 24  hours 05/02/21 0242 05/02/21 0246   05/02/21 2200  cefTRIAXone (ROCEPHIN) 2 g in sodium chloride 0.9 % 100 mL IVPB  Status:  Discontinued        2 g 200 mL/hr over 30 Minutes Intravenous Every 24 hours 05/02/21 0246 05/05/21 1419   05/02/21 0400  azithromycin (ZITHROMAX) 500 mg in sodium chloride 0.9 % 250 mL IVPB  Status:  Discontinued        500 mg 250 mL/hr over 60 Minutes Intravenous Every 24 hours 05/02/21 0242 05/05/21 1419   05/02/21 0030  vancomycin (VANCOCIN) IVPB 1000 mg/200 mL premix        1,000 mg 200 mL/hr over 60 Minutes Intravenous  Once 05/02/21 0027 05/02/21 0243   05/02/21 0030  ceFEPIme (MAXIPIME) 2 g in sodium chloride 0.9 % 100 mL IVPB        2 g 200 mL/hr over 30 Minutes  Intravenous  Once 05/02/21 0027 05/02/21 0134   05/01/21 2245  cefTRIAXone (ROCEPHIN) 2 g in sodium chloride 0.9 % 100 mL IVPB  Status:  Discontinued        2 g 200 mL/hr over 30 Minutes Intravenous Every 24 hours 05/01/21 2236 05/02/21 0245              Family Communication/Anticipated D/C date and plan/Code Status   DVT prophylaxis:      Code Status: Full Code  Family Communication: Plan discussed with Doug, son, at the bedside Disposition Plan:    Status is: Inpatient  Remains inpatient appropriate because:IV treatments appropriate due to intensity of illness or inability to take PO and Inpatient level of care appropriate due to severity of illness  Dispo: The patient is from: Home              Anticipated d/c is to: Home              Patient currently is not medically stable to d/c.   Difficult to place patient No           Subjective:   Patient is still confused but less agitated.  Marden Noble, his son, was at the bedside.  Objective:    Vitals:   05/07/21 1602 05/07/21 2106 05/08/21 0429 05/08/21 0805  BP: (!) 142/76 (!) 155/63 (!) 156/79 (!) 155/66  Pulse: 85 86 90 88  Resp: 18 19 18 16   Temp:  98.2 F (36.8 C) 98 F (36.7 C) 97.8 F (36.6 C)  TempSrc:  Oral  Oral  SpO2: 100% 98% 92% 93%  Weight:      Height:       No data found.   Intake/Output Summary (Last 24 hours) at 05/08/2021 1519 Last data filed at 05/08/2021 1000 Gross per 24 hour  Intake 660 ml  Output 700 ml  Net -40 ml   Filed Weights   04/29/21 2316  Weight: 113.4 kg    Exam:  GEN: NAD SKIN: Warm and dry.  Bruises on the left side of the face. EYES: No pallor or icterus ENT: MMM CV: RRR PULM: CTA B ABD: soft, obese, NT, +BS CNS: AAO x 3 but still a little confused, non focal EXT: No edema or tenderness           Data Reviewed:   I have personally reviewed following labs and imaging studies:  Labs: Labs show the following:   Basic Metabolic  Panel: Recent Labs  Lab 05/01/21 2244 05/02/21 0648 05/04/21 0915 05/08/21 0851  NA 135 135 137 141  K 3.5 4.1 3.8 3.9  CL 103 104 105 107  CO2 24 25 24 27   GLUCOSE 220* 230* 211* 116*  BUN 24* 23 27* 25*  CREATININE 1.39* 1.34* 1.53* 1.51*  CALCIUM 8.3* 8.3* 8.5* 8.7*  MG  --   --   --  2.5*  PHOS  --   --   --  3.8   GFR Estimated Creatinine Clearance: 43.8 mL/min (A) (by C-G formula based on SCr of 1.51 mg/dL (H)). Liver Function Tests: Recent Labs  Lab 05/01/21 2244 05/04/21 0915  AST 69* 45*  ALT 26 31  ALKPHOS 59 61  BILITOT 1.3* 0.9  PROT 6.3* 6.7  ALBUMIN 3.2* 3.1*   No results for input(s): LIPASE, AMYLASE in the last 168 hours. Recent Labs  Lab 05/04/21 0915  AMMONIA 16   Coagulation profile Recent Labs  Lab 05/01/21 2244 05/02/21 0648  INR 1.1 1.1    CBC: Recent Labs  Lab 05/01/21 2244 05/02/21 0648 05/04/21 0915 05/08/21 0851  WBC 4.8 4.1 4.8 4.0  NEUTROABS 3.7  --  3.7 2.9  HGB 10.0* 10.7* 10.1* 10.5*  HCT 29.2* 30.5* 29.0* 31.3*  MCV 104.3* 103.7* 105.1* 105.4*  PLT 99* 93* 108* 120*   Cardiac Enzymes: No results for input(s): CKTOTAL, CKMB, CKMBINDEX, TROPONINI in the last 168 hours. BNP (last 3 results) No results for input(s): PROBNP in the last 8760 hours. CBG: Recent Labs  Lab 05/07/21 1137 05/07/21 1713 05/07/21 2107 05/08/21 0841 05/08/21 1301  GLUCAP 292* 178* 231* 109* 199*   D-Dimer: No results for input(s): DDIMER in the last 72 hours. Hgb A1c: No results for input(s): HGBA1C in the last 72 hours. Lipid Profile: No results for input(s): CHOL, HDL, LDLCALC, TRIG, CHOLHDL, LDLDIRECT in the last 72 hours. Thyroid function studies: No results for input(s): TSH, T4TOTAL, T3FREE, THYROIDAB in the last 72 hours.  Invalid input(s): FREET3 Anemia work up: No results for input(s): VITAMINB12, FOLATE, FERRITIN, TIBC, IRON, RETICCTPCT in the last 72 hours. Sepsis Labs: Recent Labs  Lab 05/01/21 2244 05/02/21 0648  05/04/21 0915 05/08/21 0851  PROCALCITON  --  0.22  --   --   WBC 4.8 4.1 4.8 4.0  LATICACIDVEN 1.6 1.4  --   --     Microbiology Recent Results (from the past 240 hour(s))  Resp Panel by RT-PCR (Flu A&B, Covid) Nasopharyngeal Swab     Status: None   Collection Time: 04/30/21  1:54 AM   Specimen: Nasopharyngeal Swab; Nasopharyngeal(NP) swabs in vial transport medium  Result Value Ref Range Status   SARS Coronavirus 2 by RT PCR NEGATIVE NEGATIVE Final    Comment: (NOTE) SARS-CoV-2 target nucleic acids are NOT DETECTED.  The SARS-CoV-2 RNA is generally detectable in upper respiratory specimens during the acute phase of infection. The lowest concentration of SARS-CoV-2 viral copies this assay can detect is 138 copies/mL. A negative result does not preclude SARS-Cov-2 infection and should not be used as the sole basis for treatment or other patient management decisions. A negative result may occur with  improper specimen collection/handling, submission of specimen other than nasopharyngeal swab, presence of viral mutation(s) within the areas targeted by this assay, and inadequate number of viral copies(<138 copies/mL). A negative result must be combined with clinical observations, patient history, and epidemiological information. The expected result is Negative.  Fact Sheet for Patients:  EntrepreneurPulse.com.au  Fact Sheet for Healthcare Providers:  IncredibleEmployment.be  This test is no t yet approved or cleared by the [place]  FDA and  has been authorized for detection and/or diagnosis of SARS-CoV-2 by FDA under an Emergency Use Authorization (EUA). This EUA will remain  in effect (meaning this test can be used) for the duration of the COVID-19 declaration under Section 564(b)(1) of the Act, 21 U.S.C.section 360bbb-3(b)(1), unless the authorization is terminated  or revoked sooner.       Influenza A by PCR NEGATIVE NEGATIVE  Final   Influenza B by PCR NEGATIVE NEGATIVE Final    Comment: (NOTE) The Xpert Xpress SARS-CoV-2/FLU/RSV plus assay is intended as an aid in the diagnosis of influenza from Nasopharyngeal swab specimens and should not be used as a sole basis for treatment. Nasal washings and aspirates are unacceptable for Xpert Xpress SARS-CoV-2/FLU/RSV testing.  Fact Sheet for Patients: EntrepreneurPulse.com.au  Fact Sheet for Healthcare Providers: IncredibleEmployment.be  This test is not yet approved or cleared by the [place] FDA and has been authorized for detection and/or diagnosis of SARS-CoV-2 by FDA under an Emergency Use Authorization (EUA). This EUA will remain in effect (meaning this test can be used) for the duration of the COVID-19 declaration under Section 564(b)(1) of the Act, 21 U.S.C. section 360bbb-3(b)(1), unless the authorization is terminated or revoked.  Performed at [facility], [address], [place], [place] [address]   Urine Culture     Status: Abnormal   Collection Time: 05/01/21 10:48 AM   Specimen: Urine, Random  Result Value Ref Range Status   Specimen Description   Final    URINE, RANDOM Performed at [facility], [address] [place], [place] [address]    Special Requests   Final    NONE Performed at [facility], [place], [place] [address]    Culture 50,000 COLONIES/mL SERRATIA MARCESCENS (A)  Final   Report Status 05/04/2021 FINAL  Final   Organism ID, Bacteria SERRATIA MARCESCENS (A)  Final      Susceptibility   Serratia marcescens - MIC*    CEFAZOLIN >=64 RESISTANT Resistant     CEFEPIME <=0.12 SENSITIVE Sensitive     CEFTRIAXONE <=0.25 SENSITIVE Sensitive     CIPROFLOXACIN 1 SENSITIVE Sensitive     GENTAMICIN <=1 SENSITIVE Sensitive     NITROFURANTOIN 128 RESISTANT Resistant     TRIMETH/SULFA <=20 SENSITIVE Sensitive     * 50,000 COLONIES/mL SERRATIA  MARCESCENS  Blood Culture (routine x 2)     Status: None   Collection Time: 05/01/21 10:44 PM   Specimen: BLOOD  Result Value Ref Range Status   Specimen Description BLOOD LEFT WRIST  Final   Special Requests   Final    BOTTLES DRAWN AEROBIC AND ANAEROBIC Blood Culture adequate volume   Culture   Final    NO GROWTH 5 DAYS Performed at [facility], [address] [place], [place] [address]    Report Status 05/06/2021 FINAL  Final  Blood Culture (routine x 2)     Status: None   Collection Time: 05/01/21 10:45 PM   Specimen: BLOOD  Result Value Ref Range Status   Specimen Description BLOOD RIGHT FOREARM  Final   Special Requests   Final    BOTTLES DRAWN AEROBIC AND ANAEROBIC Blood Culture adequate volume   Culture   Final    NO GROWTH 5 DAYS Performed at [facility], [address] [place], [place] [address]    Report Status 05/06/2021 FINAL  Final  Resp Panel by RT-PCR (Flu A&B, Covid) Nasopharyngeal Swab  Status: None   Collection Time: 05/02/21 12:20 AM   Specimen: Nasopharyngeal Swab; Nasopharyngeal(NP) swabs in vial transport medium  Result Value Ref Range Status   SARS Coronavirus 2 by RT PCR NEGATIVE NEGATIVE Final    Comment: (NOTE) SARS-CoV-2 target nucleic acids are NOT DETECTED.  The SARS-CoV-2 RNA is generally detectable in upper respiratory specimens during the acute phase of infection. The lowest concentration of SARS-CoV-2 viral copies this assay can detect is 138 copies/mL. A negative result does not preclude SARS-Cov-2 infection and should not be used as the sole basis for treatment or other patient management decisions. A negative result may occur with  improper specimen collection/handling, submission of specimen other than nasopharyngeal swab, presence of viral mutation(s) within the areas targeted by this assay, and inadequate number of viral copies(<138 copies/mL). A negative result must be combined with clinical observations,  patient history, and epidemiological information. The expected result is Negative.  Fact Sheet for Patients:  EntrepreneurPulse.com.au  Fact Sheet for Healthcare Providers:  IncredibleEmployment.be  This test is no t yet approved or cleared by the [place] FDA and  has been authorized for detection and/or diagnosis of SARS-CoV-2 by FDA under an Emergency Use Authorization (EUA). This EUA will remain  in effect (meaning this test can be used) for the duration of the COVID-19 declaration under Section 564(b)(1) of the Act, 21 U.S.C.section 360bbb-3(b)(1), unless the authorization is terminated  or revoked sooner.       Influenza A by PCR NEGATIVE NEGATIVE Final   Influenza B by PCR NEGATIVE NEGATIVE Final    Comment: (NOTE) The Xpert Xpress SARS-CoV-2/FLU/RSV plus assay is intended as an aid in the diagnosis of influenza from Nasopharyngeal swab specimens and should not be used as a sole basis for treatment. Nasal washings and aspirates are unacceptable for Xpert Xpress SARS-CoV-2/FLU/RSV testing.  Fact Sheet for Patients: EntrepreneurPulse.com.au  Fact Sheet for Healthcare Providers: IncredibleEmployment.be  This test is not yet approved or cleared by the [place] FDA and has been authorized for detection and/or diagnosis of SARS-CoV-2 by FDA under an Emergency Use Authorization (EUA). This EUA will remain in effect (meaning this test can be used) for the duration of the COVID-19 declaration under Section 564(b)(1) of the Act, 21 U.S.C. section 360bbb-3(b)(1), unless the authorization is terminated or revoked.  Performed at [facility], [address] [place], [place] [address]     Procedures and diagnostic studies:  No results found.             LOS: 6 days   Jahnai Slingerland  [facility]   Pager on [email or web address]. If 7PM-7AM, please contact night-coverage  at [email or web address]     05/08/2021, 3:19 PM

## 2021-05-09 ENCOUNTER — Telehealth: Payer: Self-pay | Admitting: Oncology

## 2021-05-09 DIAGNOSIS — A419 Sepsis, unspecified organism: Secondary | ICD-10-CM | POA: Diagnosis not present

## 2021-05-09 DIAGNOSIS — R6520 Severe sepsis without septic shock: Secondary | ICD-10-CM | POA: Diagnosis not present

## 2021-05-09 LAB — GLUCOSE, CAPILLARY
Glucose-Capillary: 160 mg/dL — ABNORMAL HIGH (ref 70–99)
Glucose-Capillary: 175 mg/dL — ABNORMAL HIGH (ref 70–99)
Glucose-Capillary: 305 mg/dL — ABNORMAL HIGH (ref 70–99)
Glucose-Capillary: 229 mg/dL — ABNORMAL HIGH (ref 70–99)

## 2021-05-09 MED ORDER — AQUAPHOR EX OINT
TOPICAL_OINTMENT | Freq: Every day | CUTANEOUS | Status: DC | PRN
  Filled 2021-05-09: qty 50

## 2021-05-09 NOTE — Progress Notes (Signed)
Sutures removed from right thumb and middle finger. Patient tolerated well. Small area of drainage noted from thumb, area cleansed and no more active drainage noted. Skin really dry and cracked around the area of sutures and the area sutures removed from, scabbing noted to area as well.

## 2021-05-09 NOTE — Progress Notes (Addendum)
PROGRESS NOTE    Alex Smith  UXN:[id number] DOB: [birth year] DOA: 04/30/2021 PCP: Leonel Ramsay, MD     Brief Narrative:  Alex Smith is a 86 y.o. male with medical history including but not limited to recent accidental/mechanical fall in [place] on 04/29/2021, hypertension, insulin-dependent diabetes mellitus type 2, morbid obesity, BPH, GERD, CKD stage III hyperlipidemia, history of pneumonia, history of squamous cell carcinoma in situ, history of basal cell carcinoma of face, vitamin B12 deficiency, colonic polyps.   He presented to [facility] hospital because of generalized weakness, another mechanical fall at home and generalized body pain.  He was febrile in the emergency room with temperature of 101.4 F.  CT abdomen pelvis showed bibasilar infiltrates concerning for pneumonia.  He was admitted to the hospital for severe sepsis secondary to community-acquired pneumonia complicated by acute metabolic encephalopathy.  New events last 24 hours / Subjective: Patient seen with son at bedside. He is alert, calm, and cooperative. Admits to soreness of left hip at site of where he fell in [place], prior to admission. Remains very weak. At baseline, he is independent, lives alone, drives.   Assessment & Plan:   Principal Problem:   Severe sepsis ([place]) Active Problems:   CAP (community acquired pneumonia)   Severe sepsis secondary to community-acquired bilateral lower lobe pneumonia  -Completed 7-day course of antibiotics on 05/07/2021 -Recommend IS   Delirium/acute metabolic encephalopathy -Appreciate input from neurologist.  Continue Seroquel at night.  Use IM Haldol as needed for agitation.  Delirium precaution -Improved  Type II DM with hyperglycemia -Levemir, SSI    Hypertension -Continue lisinopril, norvasc    CKD stage IIIa -Creatinine is stable around baseline.   S/p recent fall on 04/29/2021 -Sutures removed from right hand 9/21  HLD -Continue pravachol      DVT  prophylaxis: Lovenox    Code Status:     Code Status Orders  (From admission, onward)           Start     Ordered   05/02/21 0240  Full code  Continuous        05/02/21 0242           Code Status History     Date Active Date Inactive Code Status Order ID Comments User Context   02/07/2015 0946 02/08/2015 0327 Full Code [id number]  Inez Catalina, MD HOV      Advance Directive Documentation    Flowsheet Row Most Recent Value  Type of Advance Directive Healthcare Power of Attorney  Pre-existing out of facility DNR order (yellow form or pink MOST form) --  "MOST" Form in Place? --      Family Communication: Son at bedside Disposition Plan:  Status is: Inpatient  Remains inpatient appropriate because:Unsafe d/c plan  Dispo: The patient is from: Home              Anticipated d/c is to: SNF              Patient currently is medically stable to d/c. SNF placement pending.    Difficult to place patient No   Antimicrobials:  Anti-infectives (From admission, onward)    Start     Dose/Rate Route Frequency Ordered Stop   05/05/21 2200  cefdinir (OMNICEF) capsule 300 mg        300 mg Oral Every 12 hours 05/05/21 1419 05/07/21 0942   05/02/21 2200  cefTRIAXone (ROCEPHIN) 2 g in sodium chloride 0.9 % 100 mL IVPB  Status:  Discontinued        2 g 200 mL/hr over 30 Minutes Intravenous Every 24 hours 05/02/21 0242 05/02/21 0246   05/02/21 2200  cefTRIAXone (ROCEPHIN) 2 g in sodium chloride 0.9 % 100 mL IVPB  Status:  Discontinued        2 g 200 mL/hr over 30 Minutes Intravenous Every 24 hours 05/02/21 0246 05/05/21 1419   05/02/21 0400  azithromycin (ZITHROMAX) 500 mg in sodium chloride 0.9 % 250 mL IVPB  Status:  Discontinued        500 mg 250 mL/hr over 60 Minutes Intravenous Every 24 hours 05/02/21 0242 05/05/21 1419   05/02/21 0030  vancomycin (VANCOCIN) IVPB 1000 mg/200 mL premix        1,000 mg 200 mL/hr over 60 Minutes Intravenous  Once 05/02/21 0027 05/02/21 0243    05/02/21 0030  ceFEPIme (MAXIPIME) 2 g in sodium chloride 0.9 % 100 mL IVPB        2 g 200 mL/hr over 30 Minutes Intravenous  Once 05/02/21 0027 05/02/21 0134   05/01/21 2245  cefTRIAXone (ROCEPHIN) 2 g in sodium chloride 0.9 % 100 mL IVPB  Status:  Discontinued        2 g 200 mL/hr over 30 Minutes Intravenous Every 24 hours 05/01/21 2236 05/02/21 0245        Objective: Vitals:   05/08/21 1535 05/08/21 1955 05/09/21 0500 05/09/21 0757  BP: (!) 153/78 (!) 159/74 (!) 152/76 (!) 152/68  Pulse: 83 84 86 80  Resp: 20 18 18 16   Temp: 97.9 F (36.6 C) 98.6 F (37 C) 98.7 F (37.1 C) 98.2 F (36.8 C)  TempSrc: Oral Oral Oral Oral  SpO2: 97% 97% 98% 91%  Weight:      Height:        Intake/Output Summary (Last 24 hours) at 05/09/2021 1305 Last data filed at 05/08/2021 2100 Gross per 24 hour  Intake 420 ml  Output --  Net 420 ml   Filed Weights   04/29/21 2316  Weight: 113.4 kg    Examination:  General exam: Appears calm and comfortable  Respiratory system: Diminished breath sounds without rhonchi or wheeze, no respiratory distress  Cardiovascular system: S1 & S2 heard, RRR. No murmurs. No pedal edema. Gastrointestinal system: Abdomen is nondistended, soft and nontender. Normal bowel sounds heard. Central nervous system: Alert and oriented. No focal neurological deficits. Speech clear.  Extremities: Symmetric in appearance  Skin: No rashes, lesions or ulcers on exposed skin  Psychiatry: Stable   Data Reviewed: I have personally reviewed following labs and imaging studies  CBC: Recent Labs  Lab 05/04/21 0915 05/08/21 0851  WBC 4.8 4.0  NEUTROABS 3.7 2.9  HGB 10.1* 10.5*  HCT 29.0* 31.3*  MCV 105.1* 105.4*  PLT 108* 324*   Basic Metabolic Panel: Recent Labs  Lab 05/04/21 0915 05/08/21 0851  NA 137 141  K 3.8 3.9  CL 105 107  CO2 24 27  GLUCOSE 211* 116*  BUN 27* 25*  CREATININE 1.53* 1.51*  CALCIUM 8.5* 8.7*  MG  --  2.5*  PHOS  --  3.8    GFR: Estimated Creatinine Clearance: 43.8 mL/min (A) (by C-G formula based on SCr of 1.51 mg/dL (H)). Liver Function Tests: Recent Labs  Lab 05/04/21 0915  AST 45*  ALT 31  ALKPHOS 61  BILITOT 0.9  PROT 6.7  ALBUMIN 3.1*   No results for input(s): LIPASE, AMYLASE in the last 168 hours. Recent Labs  Lab 05/04/21 0915  AMMONIA 16   Coagulation Profile: No results for input(s): INR, PROTIME in the last 168 hours. Cardiac Enzymes: No results for input(s): CKTOTAL, CKMB, CKMBINDEX, TROPONINI in the last 168 hours. BNP (last 3 results) No results for input(s): PROBNP in the last 8760 hours. HbA1C: No results for input(s): HGBA1C in the last 72 hours. CBG: Recent Labs  Lab 05/08/21 1301 05/08/21 1726 05/08/21 2052 05/09/21 0759 05/09/21 1140  GLUCAP 199* 221* 249* 160* 175*   Lipid Profile: No results for input(s): CHOL, HDL, LDLCALC, TRIG, CHOLHDL, LDLDIRECT in the last 72 hours. Thyroid Function Tests: No results for input(s): TSH, T4TOTAL, FREET4, T3FREE, THYROIDAB in the last 72 hours. Anemia Panel: No results for input(s): VITAMINB12, FOLATE, FERRITIN, TIBC, IRON, RETICCTPCT in the last 72 hours. Sepsis Labs: No results for input(s): PROCALCITON, LATICACIDVEN in the last 168 hours.  Recent Results (from the past 240 hour(s))  Resp Panel by RT-PCR (Flu A&B, Covid) Nasopharyngeal Swab     Status: None   Collection Time: 04/30/21  1:54 AM   Specimen: Nasopharyngeal Swab; Nasopharyngeal(NP) swabs in vial transport medium  Result Value Ref Range Status   SARS Coronavirus 2 by RT PCR NEGATIVE NEGATIVE Final    Comment: (NOTE) SARS-CoV-2 target nucleic acids are NOT DETECTED.  The SARS-CoV-2 RNA is generally detectable in upper respiratory specimens during the acute phase of infection. The lowest concentration of SARS-CoV-2 viral copies this assay can detect is 138 copies/mL. A negative result does not preclude SARS-Cov-2 infection and should not be used as the  sole basis for treatment or other patient management decisions. A negative result may occur with  improper specimen collection/handling, submission of specimen other than nasopharyngeal swab, presence of viral mutation(s) within the areas targeted by this assay, and inadequate number of viral copies(<138 copies/mL). A negative result must be combined with clinical observations, patient history, and epidemiological information. The expected result is Negative.  Fact Sheet for Patients:  EntrepreneurPulse.com.au  Fact Sheet for Healthcare Providers:  IncredibleEmployment.be  This test is no t yet approved or cleared by the [place] FDA and  has been authorized for detection and/or diagnosis of SARS-CoV-2 by FDA under an Emergency Use Authorization (EUA). This EUA will remain  in effect (meaning this test can be used) for the duration of the COVID-19 declaration under Section 564(b)(1) of the Act, 21 U.S.C.section 360bbb-3(b)(1), unless the authorization is terminated  or revoked sooner.       Influenza A by PCR NEGATIVE NEGATIVE Final   Influenza B by PCR NEGATIVE NEGATIVE Final    Comment: (NOTE) The Xpert Xpress SARS-CoV-2/FLU/RSV plus assay is intended as an aid in the diagnosis of influenza from Nasopharyngeal swab specimens and should not be used as a sole basis for treatment. Nasal washings and aspirates are unacceptable for Xpert Xpress SARS-CoV-2/FLU/RSV testing.  Fact Sheet for Patients: EntrepreneurPulse.com.au  Fact Sheet for Healthcare Providers: IncredibleEmployment.be  This test is not yet approved or cleared by the [place] FDA and has been authorized for detection and/or diagnosis of SARS-CoV-2 by FDA under an Emergency Use Authorization (EUA). This EUA will remain in effect (meaning this test can be used) for the duration of the COVID-19 declaration under Section 564(b)(1) of the  Act, 21 U.S.C. section 360bbb-3(b)(1), unless the authorization is terminated or revoked.  Performed at [facility], [address] [place], [place] [address]   Urine Culture     Status: Abnormal   Collection Time: 05/01/21 10:48 AM   Specimen: Urine, Random  Result Value Ref Range Status   Specimen Description   Final    URINE, RANDOM Performed at [facility], [address] [place], [place] [address]    Special Requests   Final    NONE Performed at [facility], [place], [place] [address]    Culture 50,000 COLONIES/mL SERRATIA MARCESCENS (A)  Final   Report Status 05/04/2021 FINAL  Final   Organism ID, Bacteria SERRATIA MARCESCENS (A)  Final      Susceptibility   Serratia marcescens - MIC*    CEFAZOLIN >=64 RESISTANT Resistant     CEFEPIME <=0.12 SENSITIVE Sensitive     CEFTRIAXONE <=0.25 SENSITIVE Sensitive     CIPROFLOXACIN 1 SENSITIVE Sensitive     GENTAMICIN <=1 SENSITIVE Sensitive     NITROFURANTOIN 128 RESISTANT Resistant     TRIMETH/SULFA <=20 SENSITIVE Sensitive     * 50,000 COLONIES/mL SERRATIA MARCESCENS  Blood Culture (routine x 2)     Status: None   Collection Time: 05/01/21 10:44 PM   Specimen: BLOOD  Result Value Ref Range Status   Specimen Description BLOOD LEFT WRIST  Final   Special Requests   Final    BOTTLES DRAWN AEROBIC AND ANAEROBIC Blood Culture adequate volume   Culture   Final    NO GROWTH 5 DAYS Performed at [facility], [address], [place], [place] [address]    Report Status 05/06/2021 FINAL  Final  Blood Culture (routine x 2)     Status: None   Collection Time: 05/01/21 10:45 PM   Specimen: BLOOD  Result Value Ref Range Status   Specimen Description BLOOD RIGHT FOREARM  Final   Special Requests   Final    BOTTLES DRAWN AEROBIC AND ANAEROBIC Blood Culture adequate volume   Culture   Final    NO GROWTH 5 DAYS Performed at [facility], [address]  [place], [place] [address]    Report Status 05/06/2021 FINAL  Final  Resp Panel by RT-PCR (Flu A&B, Covid) Nasopharyngeal Swab     Status: None   Collection Time: 05/02/21 12:20 AM   Specimen: Nasopharyngeal Swab; Nasopharyngeal(NP) swabs in vial transport medium  Result Value Ref Range Status   SARS Coronavirus 2 by RT PCR NEGATIVE NEGATIVE Final    Comment: (NOTE) SARS-CoV-2 target nucleic acids are NOT DETECTED.  The SARS-CoV-2 RNA is generally detectable in upper respiratory specimens during the acute phase of infection. The lowest concentration of SARS-CoV-2 viral copies this assay can detect is 138 copies/mL. A negative result does not preclude SARS-Cov-2 infection and should not be used as the sole basis for treatment or other patient management decisions. A negative result may occur with  improper specimen collection/handling, submission of specimen other than nasopharyngeal swab, presence of viral mutation(s) within the areas targeted by this assay, and inadequate number of viral copies(<138 copies/mL). A negative result must be combined with clinical observations, patient history, and epidemiological information. The expected result is Negative.  Fact Sheet for Patients:  EntrepreneurPulse.com.au  Fact Sheet for Healthcare Providers:  IncredibleEmployment.be  This test is no t yet approved or cleared by the [place] FDA and  has been authorized for detection and/or diagnosis of SARS-CoV-2 by FDA under an Emergency Use Authorization (EUA). This EUA will remain  in effect (meaning this test can be used) for the duration of the COVID-19 declaration under Section 564(b)(1) of the Act, 21 U.S.C.section 360bbb-3(b)(1), unless the authorization is terminated  or revoked sooner.  Influenza A by PCR NEGATIVE NEGATIVE Final   Influenza B by PCR NEGATIVE NEGATIVE Final    Comment: (NOTE) The Xpert Xpress SARS-CoV-2/FLU/RSV plus assay is  intended as an aid in the diagnosis of influenza from Nasopharyngeal swab specimens and should not be used as a sole basis for treatment. Nasal washings and aspirates are unacceptable for Xpert Xpress SARS-CoV-2/FLU/RSV testing.  Fact Sheet for Patients: EntrepreneurPulse.com.au  Fact Sheet for Healthcare Providers: IncredibleEmployment.be  This test is not yet approved or cleared by the [place] FDA and has been authorized for detection and/or diagnosis of SARS-CoV-2 by FDA under an Emergency Use Authorization (EUA). This EUA will remain in effect (meaning this test can be used) for the duration of the COVID-19 declaration under Section 564(b)(1) of the Act, 21 U.S.C. section 360bbb-3(b)(1), unless the authorization is terminated or revoked.  Performed at [facility], [address], [place], [place] [address]       Radiology Studies: No results found.    Scheduled Meds:  amLODipine  5 mg Oral Daily   aspirin EC  81 mg Oral Daily   enoxaparin (LOVENOX) injection  55 mg Subcutaneous Daily   guaiFENesin  600 mg Oral BID   insulin aspart  0-15 Units Subcutaneous TID WC   insulin aspart  0-5 Units Subcutaneous QHS   insulin detemir  15 Units Subcutaneous BID   lisinopril  10 mg Oral BID   nystatin   Topical BID   pantoprazole  40 mg Oral Daily   pravastatin  20 mg Oral QPM   QUEtiapine  50 mg Oral QHS   tamsulosin  0.4 mg Oral Daily   vitamin B-12  1,000 mcg Oral Daily   vitamin E  200 Units Oral Daily   Continuous Infusions:  diphenhydrAMINE Stopped (05/03/21 0420)     LOS: 7 days      Time spent: 30 minutes   Dessa Phi, DO [facility] 05/09/2021, 1:05 PM   Available via Epic secure chat 7am-7pm After these hours, please refer to coverage provider listed on [email or web address]

## 2021-05-09 NOTE — Progress Notes (Signed)
Physical Therapy Treatment Patient Details Name: Alex Smith MRN: [id number] DOB: [birth year] Today's Date: 05/09/2021   History of Present Illness Pt is an 86 y.o. male with a history of anemia, BPH, CKD, GERD, hypertension, diabetes who presented for evaluation of generalized weakness and fall. Pt had a right finger dislocation and reduction. Per MRI impression "Considerations include minimal subarachnoid hemorrhage and cortical vein thrombosis. Head CT may be helpful." Pt with multiple follow up head CT with impression "Stable appearance of the brain with no acute intracranial pathology".    PT Comments    Pt received in Semi-Fowler's position and agreeable to therapy.  Pt continues to be very pleasant with therapist and is muhc more alert and aware than last treatment session.  Pt does not report pain, however when rolling onto the L hip, he has some painful response in his face and grimacing.  Pt required bed change following attempts to stand.  Pt requires modA +1 for standing from elevated surface.  Pt stood x4 occasions and was able to side-step towards head of bed with modA from therapist and no AD used.  Current discharge plans to SNF remain appropriate at this time.  Pt will continue to benefit from skilled therapy in order to address deficits listed below.     Recommendations for follow up therapy are one component of a multi-disciplinary discharge planning process, led by the attending physician.  Recommendations may be updated based on patient status, additional functional criteria and insurance authorization.  Follow Up Recommendations  SNF;Supervision/Assistance - 24 hour     Equipment Recommendations  None recommended by PT    Recommendations for Other Services       Precautions / Restrictions Precautions Precautions: Fall Restrictions Weight Bearing Restrictions: No     Mobility  Bed Mobility Overal bed mobility: Needs Assistance Bed Mobility: Supine to Sit;Sit  to Supine     Supine to sit: Mod assist Sit to supine: Mod assist        Transfers Overall transfer level: Needs assistance Equipment used: Rolling walker (2 wheeled) Transfers: Sit to/from Stand Sit to Stand: Mod assist         General transfer comment: constant assist to prevent posterior LOB  Ambulation/Gait Ambulation/Gait assistance: Mod assist Gait Distance (Feet): 2 Feet Assistive device: Rolling walker (2 wheeled) Gait Pattern/deviations: Step-to pattern;Leaning posteriorly Gait velocity: severely decreased   General Gait Details: constant assist to prevent posterior LOB   Stairs             Wheelchair Mobility    Modified Rankin (Stroke Patients Only)       Balance Overall balance assessment: Needs assistance;History of Falls   Sitting balance-Leahy Scale: Poor Sitting balance - Comments: constant assist to prevent posterior LOB   Standing balance support: Bilateral upper extremity supported;During functional activity Standing balance-Leahy Scale: Poor Standing balance comment: constant assist to prevent posterior LOB                            Cognition Arousal/Alertness: Awake/alert (Pt easily falls asleep and is afternoon napper.) Behavior During Therapy: WFL for tasks assessed/performed Overall Cognitive Status: Impaired/Different from baseline Area of Impairment: Attention;Orientation;Following commands                 Orientation Level: Place;Time;Situation;Person Current Attention Level: Focused   Following Commands: Follows one step commands consistently              Exercises Total  Joint Exercises Ankle Circles/Pumps: AROM;Strengthening;Both;20 reps;Supine Quad Sets: AROM;Strengthening;Both;10 reps;Supine Gluteal Sets: AROM;Strengthening;Both;10 reps;Supine Heel Slides: AROM;Strengthening;Both;10 reps;Supine Hip ABduction/ADduction: AROM;Strengthening;Both;10 reps;Supine Straight Leg Raises:  AROM;Strengthening;Both;10 reps;Supine Other Exercises Other Exercises: Anterior weight shifting activities in sitting and standing    General Comments        Pertinent Vitals/Pain Pain Assessment: Faces Faces Pain Scale: Hurts even more Pain Location: L Hip, R Hand Pain Descriptors / Indicators: Aching Pain Intervention(s): Limited activity within patient's tolerance;Monitored during session;Premedicated before session;Repositioned    Home Living                      Prior Function            PT Goals (current goals can now be found in the care plan section) Acute Rehab PT Goals Patient Stated Goal: To walk better PT Goal Formulation: With patient Time For Goal Achievement: 05/14/21 Potential to Achieve Goals: Fair Progress towards PT goals: Progressing toward goals    Frequency    Min 2X/week      PT Plan Current plan remains appropriate    Co-evaluation              AM-PAC PT "6 Clicks" Mobility   Outcome Measure  Help needed turning from your back to your side while in a flat bed without using bedrails?: A Lot Help needed moving from lying on your back to sitting on the side of a flat bed without using bedrails?: A Lot Help needed moving to and from a bed to a chair (including a wheelchair)?: A Lot Help needed standing up from a chair using your arms (e.g., wheelchair or bedside chair)?: A Lot Help needed to walk in hospital room?: Total Help needed climbing 3-5 steps with a railing? : Total 6 Click Score: 10    End of Session Equipment Utilized During Treatment: Gait belt Activity Tolerance: Patient tolerated treatment well Patient left: in bed;with family/visitor present;with bed alarm set Nurse Communication: Mobility status PT Visit Diagnosis: Unsteadiness on feet (R26.81);History of falling (Z91.81);Difficulty in walking, not elsewhere classified (R26.2);Muscle weakness (generalized) (M62.81)     Time: 3559-7416 PT Time Calculation  (min) (ACUTE ONLY): 48 min  Charges:  $Therapeutic Exercise: 23-37 mins $Therapeutic Activity: 8-22 mins                     Gwenlyn Saran, PT, DPT 05/09/21, 11:41 PM    Christie Nottingham 05/09/2021, 11:38 PM

## 2021-05-09 NOTE — TOC Progression Note (Addendum)
Transition of Care [facility]) - Progression Note    Patient Details  Name: Alex Smith MRN: [id number] Date of Birth: Jan 02, 1935  [facility] CM/SW [place], LCSW Phone Number: 05/09/2021, 11:09 AM  Clinical Narrative:   Per MD, delirium improving. No longer has sitter. According to last RN assessment, patient AOx3. Left message for [facility] admissions coordinator to notify and see if they could reconsider.  1:34 pm: [facility] is still unable to make a bed offer. Left voicemail for son, Marden Noble.    Barriers to Discharge: No Barriers Identified  Expected Discharge Plan and Services                                                 Social Determinants of Health (SDOH) Interventions    Readmission Risk Interventions No flowsheet data found.

## 2021-05-09 NOTE — Telephone Encounter (Signed)
Called daughter and she was wondering if the pt will be ok to come to appt 10/10. Also she has a bill for her dad that is little over 1,000 dollars and wanted to speak to the dept that has been helping him with foundation to help the cost of his shot - luspatercept. I told that I would reach out the 2 ladies that help with that and ask them to contact you. It is [place] or Mickel Baas. Daughter agreeable. She will call us when it gets close to the 10/10 appt and if he is not able to come jus call 1-2 days ahead.

## 2021-05-09 NOTE — Telephone Encounter (Signed)
Pt daughter called to report that the patient has been admitted to the hospital and will be discharged to a rehab center. He has follow-up scheduled for 10/10, but I didn't cancel any appts for that day.

## 2021-05-09 NOTE — Progress Notes (Signed)
PT Cancellation Note  Patient Details Name: MING MCMANNIS MRN: [id number] DOB: 04/28/1935   Cancelled Treatment:    Reason Eval/Treat Not Completed: Other (comment) (Pt currently being seen by nursing staff for stitches on R hand.  Nursing staff said that it would be a while before they are finished.  Will re-attempt to see pt against at later time as medically appropriate.)   Gwenlyn Saran, PT, DPT 05/09/21, 11:29 AM

## 2021-05-10 ENCOUNTER — Inpatient Hospital Stay: Payer: Medicare Other

## 2021-05-10 DIAGNOSIS — A419 Sepsis, unspecified organism: Secondary | ICD-10-CM | POA: Diagnosis not present

## 2021-05-10 DIAGNOSIS — R6520 Severe sepsis without septic shock: Secondary | ICD-10-CM | POA: Diagnosis not present

## 2021-05-10 LAB — GLUCOSE, CAPILLARY
Glucose-Capillary: 271 mg/dL — ABNORMAL HIGH (ref 70–99)
Glucose-Capillary: 244 mg/dL — ABNORMAL HIGH (ref 70–99)
Glucose-Capillary: 216 mg/dL — ABNORMAL HIGH (ref 70–99)
Glucose-Capillary: 222 mg/dL — ABNORMAL HIGH (ref 70–99)

## 2021-05-10 LAB — SARS CORONAVIRUS 2 (TAT 6-24 HRS): SARS Coronavirus 2: NEGATIVE

## 2021-05-10 MED ORDER — INSULIN ASPART 100 UNIT/ML IJ SOLN
4.0000 [IU] | Freq: Three times a day (TID) | INTRAMUSCULAR | Status: DC
  Administered 2021-05-10 – 2021-05-11 (×3): 4 [IU] via SUBCUTANEOUS
  Filled 2021-05-10: qty 1

## 2021-05-10 NOTE — Progress Notes (Signed)
Inpatient Diabetes Program Recommendations  AACE/ADA: New Consensus Statement on Inpatient Glycemic Control   Target Ranges:  Prepandial:   less than 140 mg/dL      [facility] postprandial:   less than 180 mg/dL (1-2 hours)      Critically ill patients:  140 - 180 mg/dL    Results for Alex Smith, Alex Smith (MRN [id number]) as of 05/10/2021 11:54  Ref. Range 05/09/2021 07:59 05/09/2021 11:40 05/09/2021 17:06 05/09/2021 21:10 05/10/2021 07:31 05/10/2021 11:04  Glucose-Capillary Latest Ref Range: 70 - 99 mg/dL 160 (H) 175 (H) 305 (H) 229 (H) 271 (H) 244 (H)   Review of Glycemic Control  Diabetes history: DM2 Outpatient Diabetes medications: NPH 39 units BID, Regular 10 units BID Current orders for Inpatient glycemic control: Levemir 15 units BID, Novolog 0-15 units TID with meals, Novolog 0-5 units QHS  Inpatient Diabetes Program Recommendations:    Insulin: Please consider ordering Novolog 4 units TID with meals for meal coverage if patient eats at least 50% of meals.  Thanks, Barnie Alderman, RN, MSN, CDE Diabetes Coordinator Inpatient Diabetes Program [phone] (Team Pager from 8am to 5pm)

## 2021-05-10 NOTE — Progress Notes (Signed)
PROGRESS NOTE    Alex Smith  KDX:[id number] DOB: 05/24/1935 DOA: 04/30/2021 PCP: Leonel Ramsay, MD     Brief Narrative:  Alex Smith is a 86 y.o. male with medical history including but not limited to recent accidental/mechanical fall in [place] on 04/29/2021, hypertension, insulin-dependent diabetes mellitus type 2, morbid obesity, BPH, GERD, CKD stage III hyperlipidemia, history of pneumonia, history of squamous cell carcinoma in situ, history of basal cell carcinoma of face, vitamin B12 deficiency, colonic polyps.   He presented to [facility] hospital because of generalized weakness, another mechanical fall at home and generalized body pain.  He was febrile in the emergency room with temperature of 101.4 F.  CT abdomen pelvis showed bibasilar infiltrates concerning for pneumonia.  He was admitted to the hospital for severe sepsis secondary to community-acquired pneumonia complicated by acute metabolic encephalopathy.  New events last 24 hours / Subjective: Patient states that he feels about the same as yesterday.  No new complaints of shortness of breath.  We discussed his generalized weakness, continued physical therapy efforts, incentive spirometer use.  Assessment & Plan:   Principal Problem:   Severe sepsis ([place]) Active Problems:   CAP (community acquired pneumonia)   Severe sepsis secondary to community-acquired bilateral lower lobe pneumonia  -Completed 7-day course of antibiotics on 05/07/2021 -Recommend IS   Delirium/acute metabolic encephalopathy -Appreciate input from neurologist.  Continue Seroquel at night.  Use IM Haldol as needed for agitation.  Delirium precaution -Discontinue telemetry sitter today -Improved  Type II DM with hyperglycemia -Levemir, SSI    Hypertension -Continue lisinopril, norvasc    CKD stage IIIa -Creatinine is stable around baseline.   S/p recent fall on 04/29/2021 -Sutures removed from right hand 9/21 -Check left hip x-ray due  to continued soreness and pain  HLD -Continue pravachol   Right heel pressure ulcer, stage 1 -Not present on admission -Foam dressing      DVT prophylaxis: Lovenox    Code Status:     Code Status Orders  (From admission, onward)           Start     Ordered   05/02/21 0240  Full code  Continuous        05/02/21 0242           Code Status History     Date Active Date Inactive Code Status Order ID Comments User Context   02/07/2015 0946 02/08/2015 0327 Full Code [id number]  Inez Catalina, MD HOV      Advance Directive Documentation    Flowsheet Row Most Recent Value  Type of Advance Directive Healthcare Power of Attorney  Pre-existing out of facility DNR order (yellow form or pink MOST form) --  "MOST" Form in Place? --      Family Communication: Son at bedside Disposition Plan:  Status is: Inpatient  Remains inpatient appropriate because:Unsafe d/c plan  Dispo: The patient is from: Home              Anticipated d/c is to: SNF              Patient currently is medically stable to d/c. SNF placement pending.    Difficult to place patient No   Antimicrobials:  Anti-infectives (From admission, onward)    Start     Dose/Rate Route Frequency Ordered Stop   05/05/21 2200  cefdinir (OMNICEF) capsule 300 mg        300 mg Oral Every 12 hours 05/05/21 1419 05/07/21 0942  05/02/21 2200  cefTRIAXone (ROCEPHIN) 2 g in sodium chloride 0.9 % 100 mL IVPB  Status:  Discontinued        2 g 200 mL/hr over 30 Minutes Intravenous Every 24 hours 05/02/21 0242 05/02/21 0246   05/02/21 2200  cefTRIAXone (ROCEPHIN) 2 g in sodium chloride 0.9 % 100 mL IVPB  Status:  Discontinued        2 g 200 mL/hr over 30 Minutes Intravenous Every 24 hours 05/02/21 0246 05/05/21 1419   05/02/21 0400  azithromycin (ZITHROMAX) 500 mg in sodium chloride 0.9 % 250 mL IVPB  Status:  Discontinued        500 mg 250 mL/hr over 60 Minutes Intravenous Every 24 hours 05/02/21 0242 05/05/21 1419    05/02/21 0030  vancomycin (VANCOCIN) IVPB 1000 mg/200 mL premix        1,000 mg 200 mL/hr over 60 Minutes Intravenous  Once 05/02/21 0027 05/02/21 0243   05/02/21 0030  ceFEPIme (MAXIPIME) 2 g in sodium chloride 0.9 % 100 mL IVPB        2 g 200 mL/hr over 30 Minutes Intravenous  Once 05/02/21 0027 05/02/21 0134   05/01/21 2245  cefTRIAXone (ROCEPHIN) 2 g in sodium chloride 0.9 % 100 mL IVPB  Status:  Discontinued        2 g 200 mL/hr over 30 Minutes Intravenous Every 24 hours 05/01/21 2236 05/02/21 0245        Objective: Vitals:   05/09/21 1542 05/09/21 2014 05/10/21 0521 05/10/21 0736  BP: 136/63 (!) 143/67 (!) 107/54 (!) 103/52  Pulse: 86 95 76 77  Resp: 18 16 16    Temp: 98.6 F (37 C) 99.8 F (37.7 C) 98.5 F (36.9 C) 99.1 F (37.3 C)  TempSrc: Oral  Oral Axillary  SpO2: 93% 91% 93% 95%  Weight:      Height:        Intake/Output Summary (Last 24 hours) at 05/10/2021 1123 Last data filed at 05/10/2021 1046 Gross per 24 hour  Intake 600 ml  Output --  Net 600 ml    Filed Weights   04/29/21 2316  Weight: 113.4 kg    Examination: General exam: Appears calm and comfortable  Respiratory system: Clear to auscultation. Respiratory effort normal. Cardiovascular system: S1 & S2 heard, RRR. No pedal edema. Gastrointestinal system: Abdomen is nondistended, soft and nontender. Normal bowel sounds heard. Central nervous system: Alert and oriented. Non focal exam. Speech clear  Extremities: Symmetric in appearance bilaterally  Skin: + Erythema of the right heel Psychiatry: Judgement and insight appear stable.    Data Reviewed: I have personally reviewed following labs and imaging studies  CBC: Recent Labs  Lab 05/04/21 0915 05/08/21 0851  WBC 4.8 4.0  NEUTROABS 3.7 2.9  HGB 10.1* 10.5*  HCT 29.0* 31.3*  MCV 105.1* 105.4*  PLT 108* 120*    Basic Metabolic Panel: Recent Labs  Lab 05/04/21 0915 05/08/21 0851  NA 137 141  K 3.8 3.9  CL 105 107  CO2 24 27   GLUCOSE 211* 116*  BUN 27* 25*  CREATININE 1.53* 1.51*  CALCIUM 8.5* 8.7*  MG  --  2.5*  PHOS  --  3.8    GFR: Estimated Creatinine Clearance: 43.8 mL/min (A) (by C-G formula based on SCr of 1.51 mg/dL (H)). Liver Function Tests: Recent Labs  Lab 05/04/21 0915  AST 45*  ALT 31  ALKPHOS 61  BILITOT 0.9  PROT 6.7  ALBUMIN 3.1*    No results  for input(s): LIPASE, AMYLASE in the last 168 hours. Recent Labs  Lab 05/04/21 0915  AMMONIA 16    Coagulation Profile: No results for input(s): INR, PROTIME in the last 168 hours. Cardiac Enzymes: No results for input(s): CKTOTAL, CKMB, CKMBINDEX, TROPONINI in the last 168 hours. BNP (last 3 results) No results for input(s): PROBNP in the last 8760 hours. HbA1C: No results for input(s): HGBA1C in the last 72 hours. CBG: Recent Labs  Lab 05/09/21 1140 05/09/21 1706 05/09/21 2110 05/10/21 0731 05/10/21 1104  GLUCAP 175* 305* 229* 271* 244*    Lipid Profile: No results for input(s): CHOL, HDL, LDLCALC, TRIG, CHOLHDL, LDLDIRECT in the last 72 hours. Thyroid Function Tests: No results for input(s): TSH, T4TOTAL, FREET4, T3FREE, THYROIDAB in the last 72 hours. Anemia Panel: No results for input(s): VITAMINB12, FOLATE, FERRITIN, TIBC, IRON, RETICCTPCT in the last 72 hours. Sepsis Labs: No results for input(s): PROCALCITON, LATICACIDVEN in the last 168 hours.  Recent Results (from the past 240 hour(s))  Urine Culture     Status: Abnormal   Collection Time: 05/01/21 10:48 AM   Specimen: Urine, Random  Result Value Ref Range Status   Specimen Description   Final    URINE, RANDOM Performed at [facility], [address] [place], [place] [address]    Special Requests   Final    NONE Performed at [facility], [place], [place] [address]    Culture 50,000 COLONIES/mL SERRATIA MARCESCENS (A)  Final   Report Status 05/04/2021 FINAL  Final   Organism ID, Bacteria SERRATIA MARCESCENS (A)   Final      Susceptibility   Serratia marcescens - MIC*    CEFAZOLIN >=64 RESISTANT Resistant     CEFEPIME <=0.12 SENSITIVE Sensitive     CEFTRIAXONE <=0.25 SENSITIVE Sensitive     CIPROFLOXACIN 1 SENSITIVE Sensitive     GENTAMICIN <=1 SENSITIVE Sensitive     NITROFURANTOIN 128 RESISTANT Resistant     TRIMETH/SULFA <=20 SENSITIVE Sensitive     * 50,000 COLONIES/mL SERRATIA MARCESCENS  Blood Culture (routine x 2)     Status: None   Collection Time: 05/01/21 10:44 PM   Specimen: BLOOD  Result Value Ref Range Status   Specimen Description BLOOD LEFT WRIST  Final   Special Requests   Final    BOTTLES DRAWN AEROBIC AND ANAEROBIC Blood Culture adequate volume   Culture   Final    NO GROWTH 5 DAYS Performed at [facility], [address] [place], [place] [address]    Report Status 05/06/2021 FINAL  Final  Blood Culture (routine x 2)     Status: None   Collection Time: 05/01/21 10:45 PM   Specimen: BLOOD  Result Value Ref Range Status   Specimen Description BLOOD RIGHT FOREARM  Final   Special Requests   Final    BOTTLES DRAWN AEROBIC AND ANAEROBIC Blood Culture adequate volume   Culture   Final    NO GROWTH 5 DAYS Performed at [facility], [address] [place],  [address]    Report Status 05/06/2021 FINAL  Final  Resp Panel by RT-PCR (Flu A&B, Covid) Nasopharyngeal Swab     Status: None   Collection Time: 05/02/21 12:20 AM   Specimen: Nasopharyngeal Swab; Nasopharyngeal(NP) swabs in vial transport medium  Result Value Ref Range Status   SARS Coronavirus 2 by RT PCR NEGATIVE NEGATIVE Final    Comment: (NOTE) SARS-CoV-2 target nucleic acids are NOT DETECTED.  The SARS-CoV-2  RNA is generally detectable in upper respiratory specimens during the acute phase of infection. The lowest concentration of SARS-CoV-2 viral copies this assay can detect is 138 copies/mL. A negative result does not preclude SARS-Cov-2 infection and should not be used as the  sole basis for treatment or other patient management decisions. A negative result may occur with  improper specimen collection/handling, submission of specimen other than nasopharyngeal swab, presence of viral mutation(s) within the areas targeted by this assay, and inadequate number of viral copies(<138 copies/mL). A negative result must be combined with clinical observations, patient history, and epidemiological information. The expected result is Negative.  Fact Sheet for Patients:  EntrepreneurPulse.com.au  Fact Sheet for Healthcare Providers:  IncredibleEmployment.be  This test is no t yet approved or cleared by the [place] FDA and  has been authorized for detection and/or diagnosis of SARS-CoV-2 by FDA under an Emergency Use Authorization (EUA). This EUA will remain  in effect (meaning this test can be used) for the duration of the COVID-19 declaration under Section 564(b)(1) of the Act, 21 U.S.C.section 360bbb-3(b)(1), unless the authorization is terminated  or revoked sooner.       Influenza A by PCR NEGATIVE NEGATIVE Final   Influenza B by PCR NEGATIVE NEGATIVE Final    Comment: (NOTE) The Xpert Xpress SARS-CoV-2/FLU/RSV plus assay is intended as an aid in the diagnosis of influenza from Nasopharyngeal swab specimens and should not be used as a sole basis for treatment. Nasal washings and aspirates are unacceptable for Xpert Xpress SARS-CoV-2/FLU/RSV testing.  Fact Sheet for Patients: EntrepreneurPulse.com.au  Fact Sheet for Healthcare Providers: IncredibleEmployment.be  This test is not yet approved or cleared by the [place] FDA and has been authorized for detection and/or diagnosis of SARS-CoV-2 by FDA under an Emergency Use Authorization (EUA). This EUA will remain in effect (meaning this test can be used) for the duration of the COVID-19 declaration under Section 564(b)(1) of the  Act, 21 U.S.C. section 360bbb-3(b)(1), unless the authorization is terminated or revoked.  Performed at [facility], [address], [place], [place] [address]        Radiology Studies: No results found.    Scheduled Meds:  amLODipine  5 mg Oral Daily   aspirin EC  81 mg Oral Daily   enoxaparin (LOVENOX) injection  55 mg Subcutaneous Daily   guaiFENesin  600 mg Oral BID   insulin aspart  0-15 Units Subcutaneous TID WC   insulin aspart  0-5 Units Subcutaneous QHS   insulin detemir  15 Units Subcutaneous BID   lisinopril  10 mg Oral BID   nystatin   Topical BID   pantoprazole  40 mg Oral Daily   pravastatin  20 mg Oral QPM   QUEtiapine  50 mg Oral QHS   tamsulosin  0.4 mg Oral Daily   vitamin B-12  1,000 mcg Oral Daily   vitamin E  200 Units Oral Daily   Continuous Infusions:     LOS: 8 days      Time spent: 25 minutes   Dessa Phi, DO [facility] 05/10/2021, 11:23 AM   Available via Epic secure chat 7am-7pm After these hours, please refer to coverage provider listed on [email or web address]

## 2021-05-10 NOTE — TOC Progression Note (Addendum)
Transition of Care [facility]) - Progression Note    Patient Details  Name: Alex Smith MRN: [id number] Date of Birth: 03-13-1935  Transition of Care [facility]) CM/SW Contact  Beverly Sessions, RN Phone Number: 05/10/2021, 4:18 PM  Clinical Narrative:     Son accepted bed at [facility],  contacted Ron at [facility] to accept bed.  Per Ron they are no longer able to offer a bed due a covid outbreak at their facility.   T[facility] at [facility] reviewing case to determine if they can offer  Son would like to wait for [facility] to review before accepting bed at [facility]    440 pm [facility] able to offer.  Son accepts bed.  Accepted in [place].  Auth started in [employer] portal      Barriers to Discharge: No Barriers Identified  Expected Discharge Plan and Services                                                 Social Determinants of Health (SDOH) Interventions    Readmission Risk Interventions No flowsheet data found.

## 2021-05-10 NOTE — Care Management Important Message (Signed)
Important Message  Patient Details  Name: Alex Smith MRN: [id number] Date of Birth: 1934-09-11   Medicare Important Message Given:  Yes     Dannette Barbara 05/10/2021, 2:09 PM

## 2021-05-10 NOTE — TOC Progression Note (Signed)
Transition of Care [facility]) - Progression Note    Patient Details  Name: Alex Smith MRN: [id number] Date of Birth: [birth year]  [facility] CM/SW Contact  Beverly Sessions, RN Phone Number: 05/10/2021, 1:25 PM  Clinical Narrative:     Bed offers presented to son Naitik.  He is going to discuss with his siblings and call me back today with a decision     Barriers to Discharge: No Barriers Identified  Expected Discharge Plan and Services                                                 Social Determinants of Health (SDOH) Interventions    Readmission Risk Interventions No flowsheet data found.

## 2021-05-11 LAB — GLUCOSE, CAPILLARY: Glucose-Capillary: 314 mg/dL — ABNORMAL HIGH (ref 70–99)

## 2021-05-11 MED ORDER — AMLODIPINE BESYLATE 5 MG PO TABS: 5.0000 mg | ORAL_TABLET | Freq: Every day | ORAL | 2 refills | Status: DC

## 2021-05-11 MED ORDER — QUETIAPINE FUMARATE 50 MG PO TABS: 50.0000 mg | ORAL_TABLET | Freq: Every day | ORAL | 2 refills | Status: DC

## 2021-05-11 MED ORDER — NYSTATIN 100000 UNIT/GM EX POWD
Freq: Three times a day (TID) | CUTANEOUS | Status: DC
  Filled 2021-05-11: qty 15

## 2021-05-11 NOTE — Discharge Summary (Signed)
Physician Discharge Summary  Alex Smith VVO:[id number] DOB: April 11, 1935 DOA: 04/30/2021  PCP: Leonel Ramsay, MD  Admit date: 04/30/2021 Discharge date: 05/11/2021  Admitted From: Home Disposition:  SNF  Recommendations for Outpatient Follow-up:  Follow up with PCP in 1 week  Discharge Condition: Stable CODE STATUS: Full Diet recommendation: Carb modified    Brief/Interim Summary: Alex Smith is a 86 y.o. male with medical history including but not limited to recent accidental/mechanical fall in [place] on 04/29/2021, hypertension, insulin-dependent diabetes mellitus type 2, morbid obesity, BPH, GERD, CKD stage III hyperlipidemia, history of pneumonia, history of squamous cell carcinoma in situ, history of basal cell carcinoma of face, vitamin B12 deficiency, colonic polyps.   He presented to [facility] hospital because of generalized weakness, another mechanical fall at home and generalized body pain.  He was febrile in the emergency room with temperature of 101.4 F.  CT abdomen pelvis showed bibasilar infiltrates concerning for pneumonia.  He was admitted to the hospital for severe sepsis secondary to community-acquired pneumonia complicated by acute metabolic encephalopathy.  His encephalopathy continued to improve, although not back to his baseline. This may be his new baseline now. SNF recommended by PT.   Discharge Diagnoses:  Principal Problem:   Severe sepsis ([place]) Active Problems:   CAP (community acquired pneumonia)   Severe sepsis secondary to community-acquired bilateral lower lobe pneumonia  -Completed 7-day course of antibiotics on 05/07/2021 -Recommend IS   Delirium/acute metabolic encephalopathy -Appreciate input from neurologist.  Continue Seroquel at night.  Delirium precaution -Improved  Type II DM with hyperglycemia -Levemir, SSI    Hypertension -Continue lisinopril, norvasc    CKD stage IIIa -Creatinine is stable around baseline.   S/p recent  fall on 04/29/2021 -Sutures removed from right hand 9/21 -Left hip x-ray due to continued soreness and pain, neg for fracture    HLD -Continue pravachol    Right heel pressure ulcer, stage 1 -Not present on admission -Foam dressing   Discharge Instructions  Discharge Instructions     Increase activity slowly   Complete by: As directed       Allergies as of 05/11/2021       Reactions   Rituximab Rash   Chest tightness Chest tightness   Blood-group Specific Substance Other (See Comments)   Had a post transfusion reaction of red blood cells; NOW REQUIRES WASHED BLOOD CELLS Had a post transfusion reaction of red blood cells; NOW REQUIRES WASHED BLOOD CELLS   Primaxin [imipenem] Other (See Comments)   Possible allergy Possible allergy   Voriconazole Other (See Comments)   Sulfa Antibiotics Itching, Rash   Sulfacetamide Sodium Itching, Rash        Medication List     STOP taking these medications    ciprofloxacin 750 MG tablet Commonly known as: CIPRO   DIPHENHYDRAMINE-ACETAMINOPHEN PO   hydrochlorothiazide 25 MG tablet Commonly known as: HYDRODIURIL   temazepam 7.5 MG capsule Commonly known as: RESTORIL       TAKE these medications    amLODipine 5 MG tablet Commonly known as: NORVASC Take 1 tablet (5 mg total) by mouth daily. Start taking on: May 12, 2021   aspirin EC 81 MG tablet Take 81 mg by mouth daily.   doxazosin 1 MG tablet Commonly known as: CARDURA TAKE ONE TABLET BY MOUTH EVERY DAY   famotidine 40 MG tablet Commonly known as: PEPCID Take 0.5 tablets (20 mg total) by mouth daily. NEEDS OFFICE VISIT   glucose blood test strip Commonly known  as: Accu-Chek Aviva Plus USE TO CHECK SUGAR DAILY E11.319   HumuLIN N 100 UNIT/ML injection Generic drug: insulin NPH Human INJECT 39 UNITS SUBCUTANEOUSLY TWICE DAILY BEFORE A MEAL What changed: See the new instructions.   HumuLIN R 100 units/mL injection Generic drug: insulin  regular INJECT 10 UNITS INTO THE SKIN TWICE DAILY WITH MEALS   lisinopril 20 MG tablet Commonly known as: ZESTRIL TAKE ONE TABLET BY MOUTH TWICE DAILY   Microlet Lancets Misc USE AS DIRECTED   omeprazole 20 MG capsule Commonly known as: PRILOSEC Take 1 capsule by mouth 1 day or 1 dose.   pravastatin 20 MG tablet Commonly known as: PRAVACHOL TAKE ONE TABLET EVERY EVENING   QUEtiapine 50 MG tablet Commonly known as: SEROQUEL Take 1 tablet (50 mg total) by mouth at bedtime.   tamsulosin 0.4 MG Caps capsule Commonly known as: FLOMAX TAKE 1 CAPSULE BY MOUTH EVERY DAY   traZODone 50 MG tablet Commonly known as: DESYREL TAKE 1/2 TO 1 TABLET BY MOUTH AT BEDTIMEAS NEEDED FOR SLEEP   UltiCare Insulin Syringe 31G X 5/16" 0.5 ML Misc Generic drug: Insulin Syringe-Needle U-100 See admin instructions.   vitamin B-12 1000 MCG tablet Commonly known as: CYANOCOBALAMIN Take 1 tablet (1,000 mcg total) by mouth daily.   vitamin E 180 MG (400 UNITS) capsule Take 200 Units by mouth daily.        Follow-up Information     Leonel Ramsay, MD Follow up.   Specialty: Infectious Diseases Contact information: [place] [address] [phone]                Allergies  Allergen Reactions   Rituximab Rash    Chest tightness Chest tightness   Blood-Group Specific Substance Other (See Comments)    Had a post transfusion reaction of red blood cells; NOW REQUIRES WASHED BLOOD CELLS Had a post transfusion reaction of red blood cells; NOW REQUIRES WASHED BLOOD CELLS   Primaxin [Imipenem] Other (See Comments)    Possible allergy Possible allergy   Voriconazole Other (See Comments)   Sulfa Antibiotics Itching and Rash   Sulfacetamide Sodium Itching and Rash     Procedures/Studies: CT Head Wo Contrast  Result Date: 05/01/2021 CLINICAL DATA:  Altered mental status EXAM: CT HEAD WITHOUT CONTRAST TECHNIQUE: Contiguous axial images were obtained from  the base of the skull through the vertex without intravenous contrast. COMPARISON:  Brain MRI 04/30/2021, CT head 04/30/2021 FINDINGS: Brain: There is no acute intracranial hemorrhage, extra-axial fluid collection, or infarct. The ventricles are stable in size. Mild chronic white matter microangiopathic change is stable. There is no mass lesion. There is no midline shift. Vascular: There is calcification of the bilateral cavernous ICAs. Skull: Normal. Negative for fracture or focal lesion. Sinuses/Orbits: The imaged paranasal sinuses are clear. Bilateral lens implants are in place. The globes and orbits are otherwise unremarkable. Other: None. IMPRESSION: Stable appearance of the brain with no acute intracranial pathology. Electronically Signed   By: Valetta Mole M.D.   On: 05/01/2021 10:18   CT HEAD WO CONTRAST (5MM)  Result Date: 04/30/2021 CLINICAL DATA:  Neuro deficit. EXAM: CT HEAD WITHOUT CONTRAST TECHNIQUE: Contiguous axial images were obtained from the base of the skull through the vertex without intravenous contrast. COMPARISON:  MR head 04/30/2021 BRAIN: BRAIN Patchy and confluent areas of decreased attenuation are noted throughout the deep and periventricular white matter of the cerebral hemispheres bilaterally, compatible with chronic microvascular ischemic disease. No evidence of large-territorial acute  infarction. No parenchymal hemorrhage. No mass lesion. No extra-axial collection. No mass effect or midline shift. No hydrocephalus. Basilar cisterns are patent. Vascular: No hyperdense vessel. Atherosclerotic calcifications are present within the cavernous internal carotid arteries. Skull: No acute fracture or focal lesion. Sinuses/Orbits: Paranasal sinuses and mastoid air cells are clear. Bilateral lens replacement. The orbits are unremarkable. Other: None. IMPRESSION: No acute intracranial abnormality. Electronically Signed   By: Iven Finn M.D.   On: 04/30/2021 17:59   MR BRAIN WO  CONTRAST  Result Date: 04/30/2021 CLINICAL DATA:  Neuro deficit, acute, stroke suspected EXAM: MRI HEAD WITHOUT CONTRAST TECHNIQUE: Multiplanar, multiecho pulse sequences of the brain and surrounding structures were obtained without intravenous contrast. COMPARISON:  None. FINDINGS: Brain: There is curvilinear susceptibility in the right frontal lobe contiguous with a cortical vein (series 13, images 39-41). Subtle corresponding T2 FLAIR hyperintensity. There is no acute infarction. There is no intracranial mass or mass effect. There is no hydrocephalus or extra-axial fluid collection. Prominence of the ventricles and sulci reflects generalized parenchymal loss. Patchy T2 hyperintensity in the supratentorial white matter is nonspecific but may reflect underlying microvascular ischemic changes. Vascular: Major vessel flow voids at the skull base are preserved. Skull and upper cervical spine: Normal marrow signal is preserved. Sinuses/Orbits: Mild mucosal thickening. Bilateral lens replacements. Other: Sella is unremarkable. Patchy right mastoid fluid opacification. IMPRESSION: Curvilinear possible abnormal signal in the right frontal lobe contiguous with a cortical vein. Considerations include minimal subarachnoid hemorrhage and cortical vein thrombosis. Head CT may be helpful. Electronically Signed   By: Macy Mis M.D.   On: 04/30/2021 11:34   CT ABDOMEN PELVIS W CONTRAST  Result Date: 05/02/2021 CLINICAL DATA:  Abdominal pain and weakness. EXAM: CT ABDOMEN AND PELVIS WITH CONTRAST TECHNIQUE: Multidetector CT imaging of the abdomen and pelvis was performed using the standard protocol following bolus administration of intravenous contrast. CONTRAST:  157mL OMNIPAQUE IOHEXOL 350 MG/ML SOLN COMPARISON:  April 04, 2017 FINDINGS: Lower chest: Mild to moderate severity areas of atelectasis and/or infiltrate are seen within the bilateral lung bases. Very small bilateral pleural effusions are noted. A very small  anterior pericardial effusion is noted. Hepatobiliary: No focal liver abnormality is seen. Numerous small gallstones are seen within the gallbladder lumen without evidence of gallbladder wall thickening or biliary dilatation. Pancreas: Diffuse pancreatic atrophy is noted without evidence of pancreatic duct dilatation. Spleen: Numerous punctate calcified granulomas are seen scattered throughout the spleen. Adrenals/Urinary Tract: Adrenal glands are unremarkable. Kidneys are normal in size, without renal calculi, focal or hydronephrosis. A 3.8 cm x 2.5 cm simple cyst is seen along the posteromedial aspect of the mid left kidney. Additional smaller simple renal cysts are seen bilaterally. Bladder is unremarkable. Stomach/Bowel: There is a small hiatal hernia. Appendix appears normal. No evidence of bowel wall thickening, distention, or inflammatory changes. Noninflamed diverticula are seen throughout the sigmoid colon. Vascular/Lymphatic: Aortic atherosclerosis. No enlarged abdominal or pelvic lymph nodes. Reproductive: There is mild prostate gland enlargement with moderate severity prostate calcification. Other: No abdominal wall hernia or abnormality. No abdominopelvic ascites. Musculoskeletal: Degenerative changes are seen throughout the lumbar spine. IMPRESSION: 1. Mild to moderate severity bibasilar atelectasis and/or infiltrate. 2. Very small bilateral pleural effusions. 3. Cholelithiasis. 4. Sigmoid diverticulosis. 5. Aortic atherosclerosis. Aortic Atherosclerosis (ICD10-I70.0). Electronically Signed   By: Virgina Norfolk M.D.   On: 05/02/2021 00:20   DG Chest Port 1 View  Result Date: 05/01/2021 CLINICAL DATA:  [place], weakness EXAM: PORTABLE CHEST 1 VIEW COMPARISON:  01/31/2015 FINDINGS:  Single frontal view of the chest demonstrates an unremarkable cardiac silhouette. Chronic scarring at the lung bases without airspace disease, effusion, or pneumothorax. No acute bony abnormalities. IMPRESSION: 1. Stable  chest, no acute process. Electronically Signed   By: Randa Ngo M.D.   On: 05/01/2021 23:08   DG HIP UNILAT WITH PELVIS 2-3 VIEWS LEFT  Result Date: 05/10/2021 CLINICAL DATA:  Left hip pain, no known injury EXAM: DG HIP (WITH OR WITHOUT PELVIS) 2-3V LEFT COMPARISON:  None. FINDINGS: There is no evidence of hip fracture or dislocation. Mild superior joint space loss without significant osteophytosis. Nonobstructive pattern of overlying bowel gas. Phleboliths in the pelvis. IMPRESSION: No fracture or dislocation of the left hip. Mild superior joint space loss without significant osteophytosis. Electronically Signed   By: Eddie Candle M.D.   On: 05/10/2021 11:24      Discharge Exam: Vitals:   05/11/21 0431 05/11/21 0824  BP: (!) 160/65 (!) 150/63  Pulse: 65 83  Resp: 20   Temp: (!) 97.5 F (36.4 C) 98 F (36.7 C)  SpO2: 95% 94%     General: Pt is alert, awake, not in acute distress Cardiovascular: RRR, S1/S2 +, no edema Respiratory: CTA bilaterally, no wheezing, no rhonchi, no respiratory distress, no conversational dyspnea, on room air  Abdominal: Soft, NT, ND, bowel sounds + Extremities: no edema, no cyanosis Psych: Normal mood and affect   The results of significant diagnostics from this hospitalization (including imaging, microbiology, ancillary and laboratory) are listed below for reference.     Microbiology: Recent Results (from the past 240 hour(s))  Urine Culture     Status: Abnormal   Collection Time: 05/01/21 10:48 AM   Specimen: Urine, Random  Result Value Ref Range Status   Specimen Description   Final    URINE, RANDOM Performed at [facility], [address], [place], [place] [address]    Special Requests   Final    NONE Performed at [facility], [place], [place] [address]    Culture 50,000 COLONIES/mL SERRATIA MARCESCENS (A)  Final   Report Status 05/04/2021 FINAL  Final   Organism ID, Bacteria SERRATIA MARCESCENS  (A)  Final      Susceptibility   Serratia marcescens - MIC*    CEFAZOLIN >=64 RESISTANT Resistant     CEFEPIME <=0.12 SENSITIVE Sensitive     CEFTRIAXONE <=0.25 SENSITIVE Sensitive     CIPROFLOXACIN 1 SENSITIVE Sensitive     GENTAMICIN <=1 SENSITIVE Sensitive     NITROFURANTOIN 128 RESISTANT Resistant     TRIMETH/SULFA <=20 SENSITIVE Sensitive     * 50,000 COLONIES/mL SERRATIA MARCESCENS  Blood Culture (routine x 2)     Status: None   Collection Time: 05/01/21 10:44 PM   Specimen: BLOOD  Result Value Ref Range Status   Specimen Description BLOOD LEFT WRIST  Final   Special Requests   Final    BOTTLES DRAWN AEROBIC AND ANAEROBIC Blood Culture adequate volume   Culture   Final    NO GROWTH 5 DAYS Performed at [facility], [address] [place], [place] [address]    Report Status 05/06/2021 FINAL  Final  Blood Culture (routine x 2)     Status: None   Collection Time: 05/01/21 10:45 PM   Specimen: BLOOD  Result Value Ref Range Status   Specimen Description BLOOD RIGHT FOREARM  Final   Special Requests   Final    BOTTLES DRAWN AEROBIC AND ANAEROBIC Blood Culture adequate  volume   Culture   Final    NO GROWTH 5 DAYS Performed at [facility], [address] [place], [place] [address]    Report Status 05/06/2021 FINAL  Final  Resp Panel by RT-PCR (Flu A&B, Covid) Nasopharyngeal Swab     Status: None   Collection Time: 05/02/21 12:20 AM   Specimen: Nasopharyngeal Swab; Nasopharyngeal(NP) swabs in vial transport medium  Result Value Ref Range Status   SARS Coronavirus 2 by RT PCR NEGATIVE NEGATIVE Final    Comment: (NOTE) SARS-CoV-2 target nucleic acids are NOT DETECTED.  The SARS-CoV-2 RNA is generally detectable in upper respiratory specimens during the acute phase of infection. The lowest concentration of SARS-CoV-2 viral copies this assay can detect is 138 copies/mL. A negative result does not preclude SARS-Cov-2 infection and should not be used as  the sole basis for treatment or other patient management decisions. A negative result may occur with  improper specimen collection/handling, submission of specimen other than nasopharyngeal swab, presence of viral mutation(s) within the areas targeted by this assay, and inadequate number of viral copies(<138 copies/mL). A negative result must be combined with clinical observations, patient history, and epidemiological information. The expected result is Negative.  Fact Sheet for Patients:  EntrepreneurPulse.com.au  Fact Sheet for Healthcare Providers:  IncredibleEmployment.be  This test is no t yet approved or cleared by the [place] FDA and  has been authorized for detection and/or diagnosis of SARS-CoV-2 by FDA under an Emergency Use Authorization (EUA). This EUA will remain  in effect (meaning this test can be used) for the duration of the COVID-19 declaration under Section 564(b)(1) of the Act, 21 U.S.C.section 360bbb-3(b)(1), unless the authorization is terminated  or revoked sooner.       Influenza A by PCR NEGATIVE NEGATIVE Final   Influenza B by PCR NEGATIVE NEGATIVE Final    Comment: (NOTE) The Xpert Xpress SARS-CoV-2/FLU/RSV plus assay is intended as an aid in the diagnosis of influenza from Nasopharyngeal swab specimens and should not be used as a sole basis for treatment. Nasal washings and aspirates are unacceptable for Xpert Xpress SARS-CoV-2/FLU/RSV testing.  Fact Sheet for Patients: EntrepreneurPulse.com.au  Fact Sheet for Healthcare Providers: IncredibleEmployment.be  This test is not yet approved or cleared by the [place] FDA and has been authorized for detection and/or diagnosis of SARS-CoV-2 by FDA under an Emergency Use Authorization (EUA). This EUA will remain in effect (meaning this test can be used) for the duration of the COVID-19 declaration under Section 564(b)(1) of  the Act, 21 U.S.C. section 360bbb-3(b)(1), unless the authorization is terminated or revoked.  Performed at [facility], [place], [place] [address]   SARS CORONAVIRUS 2 (TAT 6-24 HRS) Nasopharyngeal Nasopharyngeal Swab     Status: None   Collection Time: 05/10/21 12:15 PM   Specimen: Nasopharyngeal Swab  Result Value Ref Range Status   SARS Coronavirus 2 NEGATIVE NEGATIVE Final    Comment: (NOTE) SARS-CoV-2 target nucleic acids are NOT DETECTED.  The SARS-CoV-2 RNA is generally detectable in upper and lower respiratory specimens during the acute phase of infection. Negative results do not preclude SARS-CoV-2 infection, do not rule out co-infections with other pathogens, and should not be used as the sole basis for treatment or other patient management decisions. Negative results must be combined with clinical observations, patient history, and epidemiological information. The expected result is Negative.  Fact Sheet for Patients: SugarRoll.be  Fact Sheet for Healthcare Providers: https://www.woods-mathews.com/  This test is not  yet approved or cleared by the [place] and  has been authorized for detection and/or diagnosis of SARS-CoV-2 by FDA under an Emergency Use Authorization (EUA). This EUA will remain  in effect (meaning this test can be used) for the duration of the COVID-19 declaration under Se ction 564(b)(1) of the Act, 21 U.S.C. section 360bbb-3(b)(1), unless the authorization is terminated or revoked sooner.  Performed at [facility], [place] [address] [place], [place] [address]      Labs: BNP (last 3 results) No results for input(s): BNP in the last 8760 hours. Basic Metabolic Panel: Recent Labs  Lab 05/08/21 0851  NA 141  K 3.9  CL 107  CO2 27  GLUCOSE 116*  BUN 25*  CREATININE 1.51*  CALCIUM 8.7*  MG 2.5*  PHOS 3.8   Liver Function Tests: No results for  input(s): AST, ALT, ALKPHOS, BILITOT, PROT, ALBUMIN in the last 168 hours. No results for input(s): LIPASE, AMYLASE in the last 168 hours. No results for input(s): AMMONIA in the last 168 hours. CBC: Recent Labs  Lab 05/08/21 0851  WBC 4.0  NEUTROABS 2.9  HGB 10.5*  HCT 31.3*  MCV 105.4*  PLT 120*   Cardiac Enzymes: No results for input(s): CKTOTAL, CKMB, CKMBINDEX, TROPONINI in the last 168 hours. BNP: Invalid input(s): POCBNP CBG: Recent Labs  Lab 05/09/21 2110 05/10/21 0731 05/10/21 1104 05/10/21 1735 05/10/21 2102  GLUCAP 229* 271* 244* 216* 222*   D-Dimer No results for input(s): DDIMER in the last 72 hours. Hgb A1c No results for input(s): HGBA1C in the last 72 hours. Lipid Profile No results for input(s): CHOL, HDL, LDLCALC, TRIG, CHOLHDL, LDLDIRECT in the last 72 hours. Thyroid function studies No results for input(s): TSH, T4TOTAL, T3FREE, THYROIDAB in the last 72 hours.  Invalid input(s): FREET3 Anemia work up No results for input(s): VITAMINB12, FOLATE, FERRITIN, TIBC, IRON, RETICCTPCT in the last 72 hours. Urinalysis    Component Value Date/Time   COLORURINE YELLOW (A) 05/01/2021 1048   APPEARANCEUR CLEAR (A) 05/01/2021 1048   LABSPEC 1.012 05/01/2021 1048   PHURINE 5.0 05/01/2021 1048   GLUCOSEU >=500 (A) 05/01/2021 1048   HGBUR LARGE (A) 05/01/2021 1048   BILIRUBINUR NEGATIVE 05/01/2021 1048   BILIRUBINUR Negative 08/02/2016 1212   KETONESUR 5 (A) 05/01/2021 1048   PROTEINUR 100 (A) 05/01/2021 1048   UROBILINOGEN 0.2 08/02/2016 1212   NITRITE NEGATIVE 05/01/2021 1048   LEUKOCYTESUR NEGATIVE 05/01/2021 1048   Sepsis Labs Invalid input(s): PROCALCITONIN,  WBC,  LACTICIDVEN Microbiology Recent Results (from the past 240 hour(s))  Urine Culture     Status: Abnormal   Collection Time: 05/01/21 10:48 AM   Specimen: Urine, Random  Result Value Ref Range Status   Specimen Description   Final    URINE, RANDOM Performed at [facility],  [address], [place], [place] [address]    Special Requests   Final    NONE Performed at [facility], [address] [place], [place] [address]    Culture 50,000 COLONIES/mL SERRATIA MARCESCENS (A)  Final   Report Status 05/04/2021 FINAL  Final   Organism ID, Bacteria SERRATIA MARCESCENS (A)  Final      Susceptibility   Serratia marcescens - MIC*    CEFAZOLIN >=64 RESISTANT Resistant     CEFEPIME <=0.12 SENSITIVE Sensitive     CEFTRIAXONE <=0.25 SENSITIVE Sensitive     CIPROFLOXACIN 1 SENSITIVE Sensitive     GENTAMICIN <=1 SENSITIVE Sensitive  NITROFURANTOIN 128 RESISTANT Resistant     TRIMETH/SULFA <=20 SENSITIVE Sensitive     * 50,000 COLONIES/mL SERRATIA MARCESCENS  Blood Culture (routine x 2)     Status: None   Collection Time: 05/01/21 10:44 PM   Specimen: BLOOD  Result Value Ref Range Status   Specimen Description BLOOD LEFT WRIST  Final   Special Requests   Final    BOTTLES DRAWN AEROBIC AND ANAEROBIC Blood Culture adequate volume   Culture   Final    NO GROWTH 5 DAYS Performed at [facility], [address], [place], [place] [address]    Report Status 05/06/2021 FINAL  Final  Blood Culture (routine x 2)     Status: None   Collection Time: 05/01/21 10:45 PM   Specimen: BLOOD  Result Value Ref Range Status   Specimen Description BLOOD RIGHT FOREARM  Final   Special Requests   Final    BOTTLES DRAWN AEROBIC AND ANAEROBIC Blood Culture adequate volume   Culture   Final    NO GROWTH 5 DAYS Performed at [facility], [place] [place],  [address]    Report Status 05/06/2021 FINAL  Final  Resp Panel by RT-PCR (Flu A&B, Covid) Nasopharyngeal Swab     Status: None   Collection Time: 05/02/21 12:20 AM   Specimen: Nasopharyngeal Swab; Nasopharyngeal(NP) swabs in vial transport medium  Result Value Ref Range Status   SARS Coronavirus 2 by RT PCR NEGATIVE NEGATIVE Final    Comment: (NOTE) SARS-CoV-2 target nucleic acids  are NOT DETECTED.  The SARS-CoV-2 RNA is generally detectable in upper respiratory specimens during the acute phase of infection. The lowest concentration of SARS-CoV-2 viral copies this assay can detect is 138 copies/mL. A negative result does not preclude SARS-Cov-2 infection and should not be used as the sole basis for treatment or other patient management decisions. A negative result may occur with  improper specimen collection/handling, submission of specimen other than nasopharyngeal swab, presence of viral mutation(s) within the areas targeted by this assay, and inadequate number of viral copies(<138 copies/mL). A negative result must be combined with clinical observations, patient history, and epidemiological information. The expected result is Negative.  Fact Sheet for Patients:  EntrepreneurPulse.com.au  Fact Sheet for Healthcare Providers:  IncredibleEmployment.be  This test is no t yet approved or cleared by the [place] FDA and  has been authorized for detection and/or diagnosis of SARS-CoV-2 by FDA under an Emergency Use Authorization (EUA). This EUA will remain  in effect (meaning this test can be used) for the duration of the COVID-19 declaration under Section 564(b)(1) of the Act, 21 U.S.C.section 360bbb-3(b)(1), unless the authorization is terminated  or revoked sooner.       Influenza A by PCR NEGATIVE NEGATIVE Final   Influenza B by PCR NEGATIVE NEGATIVE Final    Comment: (NOTE) The Xpert Xpress SARS-CoV-2/FLU/RSV plus assay is intended as an aid in the diagnosis of influenza from Nasopharyngeal swab specimens and should not be used as a sole basis for treatment. Nasal washings and aspirates are unacceptable for Xpert Xpress SARS-CoV-2/FLU/RSV testing.  Fact Sheet for Patients: EntrepreneurPulse.com.au  Fact Sheet for Healthcare Providers: IncredibleEmployment.be  This test is  not yet approved or cleared by the [place] FDA and has been authorized for detection and/or diagnosis of SARS-CoV-2 by FDA under an Emergency Use Authorization (EUA). This EUA will remain in effect (meaning this test can be used) for the duration of the COVID-19 declaration under Section 564(b)(1)  of the Act, 21 U.S.C. section 360bbb-3(b)(1), unless the authorization is terminated or revoked.  Performed at [facility], [place], [place] [address]   SARS CORONAVIRUS 2 (TAT 6-24 HRS) Nasopharyngeal Nasopharyngeal Swab     Status: None   Collection Time: 05/10/21 12:15 PM   Specimen: Nasopharyngeal Swab  Result Value Ref Range Status   SARS Coronavirus 2 NEGATIVE NEGATIVE Final    Comment: (NOTE) SARS-CoV-2 target nucleic acids are NOT DETECTED.  The SARS-CoV-2 RNA is generally detectable in upper and lower respiratory specimens during the acute phase of infection. Negative results do not preclude SARS-CoV-2 infection, do not rule out co-infections with other pathogens, and should not be used as the sole basis for treatment or other patient management decisions. Negative results must be combined with clinical observations, patient history, and epidemiological information. The expected result is Negative.  Fact Sheet for Patients: SugarRoll.be  Fact Sheet for Healthcare Providers: https://www.woods-mathews.com/  This test is not yet approved or cleared by the [place] FDA and  has been authorized for detection and/or diagnosis of SARS-CoV-2 by FDA under an Emergency Use Authorization (EUA). This EUA will remain  in effect (meaning this test can be used) for the duration of the COVID-19 declaration under Se ction 564(b)(1) of the Act, 21 U.S.C. section 360bbb-3(b)(1), unless the authorization is terminated or revoked sooner.  Performed at [facility], [place] [address], [place], [place] [address]       Patient was seen and examined on the day of discharge and was found to be in stable condition. Time coordinating discharge: 35 minutes including assessment and coordination of care, as well as examination of the patient.   SIGNED:  Dessa Phi, DO [facility] 05/11/2021, 9:51 AM

## 2021-05-11 NOTE — TOC Transition Note (Signed)
Transition of Care [facility]) - CM/SW Discharge Note   Patient Details  Name: Alex Smith MRN: [id number] Date of Birth: [birth year]  Transition of Care [facility]) CM/SW Contact:  Beverly Sessions, RN Phone Number: 05/11/2021, 1:05 PM   Clinical Narrative:     Patient will DC VB:TYOM.  Insurance auth obtained Anticipated DC date: 05/11/21 Family notified:Nykeem - son Transport by:EMS  Per MD patient ready for DC to . RN, patient, patient's family, and facility notified of DC. Discharge Summary sent to facility. RN given number for report. DC packet on chart. Ambulance transport requested for patient.  TOC signing off.  Isaias Cowman [facility] [phone]   Final next level of care: Skilled Nursing Facility Barriers to Discharge: Barriers Resolved   Patient Goals and CMS Choice        Discharge Placement              Patient chooses bed at: [facility] Patient to be transferred to facility by: EMS Name of family member notified: son clevester Patient and family notified of of transfer: 05/11/21  Discharge Plan and Services                                     Social Determinants of Health (SDOH) Interventions     Readmission Risk Interventions Readmission Risk Prevention Plan 05/11/2021  Transportation Screening Complete  Medication Review [employer]) Complete  PCP or Specialist appointment within 3-5 days of discharge Complete  HRI or Home Care Consult Complete  SW Recovery Care/Counseling Consult Complete  Palliative Care Screening Complete  Skilled Nursing Facility Complete  Some recent data might be hidden

## 2021-05-23 ENCOUNTER — Other Ambulatory Visit: Payer: Self-pay

## 2021-05-23 ENCOUNTER — Encounter: Payer: Self-pay | Admitting: *Deleted

## 2021-05-23 ENCOUNTER — Emergency Department: Payer: Medicare Other

## 2021-05-23 ENCOUNTER — Emergency Department
Admission: EM | Admit: 2021-05-23 | Discharge: 2021-05-23 | Disposition: A | Discharge status: Home / Self Care | Payer: Medicare Other | Attending: Emergency Medicine | Admitting: Emergency Medicine

## 2021-05-23 DIAGNOSIS — N183 Chronic kidney disease, stage 3 unspecified: Secondary | ICD-10-CM | POA: Insufficient documentation

## 2021-05-23 DIAGNOSIS — Z794 Long term (current) use of insulin: Secondary | ICD-10-CM | POA: Diagnosis not present

## 2021-05-23 DIAGNOSIS — E11649 Type 2 diabetes mellitus with hypoglycemia without coma: Secondary | ICD-10-CM | POA: Diagnosis not present

## 2021-05-23 DIAGNOSIS — Z87891 Personal history of nicotine dependence: Secondary | ICD-10-CM | POA: Insufficient documentation

## 2021-05-23 DIAGNOSIS — Z85828 Personal history of other malignant neoplasm of skin: Secondary | ICD-10-CM | POA: Diagnosis not present

## 2021-05-23 DIAGNOSIS — R410 Disorientation, unspecified: Secondary | ICD-10-CM | POA: Diagnosis not present

## 2021-05-23 DIAGNOSIS — I129 Hypertensive chronic kidney disease with stage 1 through stage 4 chronic kidney disease, or unspecified chronic kidney disease: Secondary | ICD-10-CM | POA: Diagnosis not present

## 2021-05-23 DIAGNOSIS — Z7982 Long term (current) use of aspirin: Secondary | ICD-10-CM | POA: Diagnosis not present

## 2021-05-23 DIAGNOSIS — Z79899 Other long term (current) drug therapy: Secondary | ICD-10-CM | POA: Diagnosis not present

## 2021-05-23 DIAGNOSIS — Z20822 Contact with and (suspected) exposure to covid-19: Secondary | ICD-10-CM | POA: Diagnosis not present

## 2021-05-23 DIAGNOSIS — E162 Hypoglycemia, unspecified: Secondary | ICD-10-CM

## 2021-05-23 DIAGNOSIS — E1122 Type 2 diabetes mellitus with diabetic chronic kidney disease: Secondary | ICD-10-CM | POA: Insufficient documentation

## 2021-05-23 DIAGNOSIS — W19XXXA Unspecified fall, initial encounter: Secondary | ICD-10-CM | POA: Diagnosis not present

## 2021-05-23 LAB — COMPREHENSIVE METABOLIC PANEL
ALT: 18 U/L (ref 0–44)
AST: 18 U/L (ref 15–41)
Albumin: 3.1 g/dL — ABNORMAL LOW (ref 3.5–5.0)
Alkaline Phosphatase: 87 U/L (ref 38–126)
Anion gap: 4 — ABNORMAL LOW (ref 5–15)
BUN: 21 mg/dL (ref 8–23)
CO2: 24 mmol/L (ref 22–32)
Calcium: 8.5 mg/dL — ABNORMAL LOW (ref 8.9–10.3)
Chloride: 109 mmol/L (ref 98–111)
Creatinine, Ser: 1.61 mg/dL — ABNORMAL HIGH (ref 0.61–1.24)
GFR, Estimated: 42 mL/min — ABNORMAL LOW (ref >60)
Glucose, Bld: 107 mg/dL — ABNORMAL HIGH (ref 70–99)
Potassium: 4.1 mmol/L (ref 3.5–5.1)
Sodium: 137 mmol/L (ref 135–145)
Total Bilirubin: 0.7 mg/dL (ref 0.3–1.2)
Total Protein: 6.6 g/dL (ref 6.5–8.1)

## 2021-05-23 LAB — CBC WITH DIFFERENTIAL/PLATELET
Abs Immature Granulocytes: 0.01 10*3/uL (ref 0.00–0.07)
Basophils Absolute: 0 10*3/uL (ref 0.0–0.1)
Basophils Relative: 0 %
Eosinophils Absolute: 0.1 10*3/uL (ref 0.0–0.5)
Eosinophils Relative: 3 %
HCT: 26.8 % — ABNORMAL LOW (ref 39.0–52.0)
Hemoglobin: 8.8 g/dL — ABNORMAL LOW (ref 13.0–17.0)
Immature Granulocytes: 0 %
Lymphocytes Relative: 15 %
Lymphs Abs: 0.6 10*3/uL — ABNORMAL LOW (ref 0.7–4.0)
MCH: 35.3 pg — ABNORMAL HIGH (ref 26.0–34.0)
MCHC: 32.8 g/dL (ref 30.0–36.0)
MCV: 107.6 fL — ABNORMAL HIGH (ref 80.0–100.0)
Monocytes Absolute: 0.2 10*3/uL (ref 0.1–1.0)
Monocytes Relative: 4 %
Neutro Abs: 3 10*3/uL (ref 1.7–7.7)
Neutrophils Relative %: 78 %
Platelets: 125 10*3/uL — ABNORMAL LOW (ref 150–400)
RBC: 2.49 MIL/uL — ABNORMAL LOW (ref 4.22–5.81)
RDW: 14.8 % (ref 11.5–15.5)
WBC: 3.9 10*3/uL — ABNORMAL LOW (ref 4.0–10.5)
nRBC: 0 % (ref 0.0–0.2)

## 2021-05-23 LAB — CBG MONITORING, ED: Glucose-Capillary: 122 mg/dL — ABNORMAL HIGH (ref 70–99)

## 2021-05-23 LAB — TROPONIN I (HIGH SENSITIVITY): Troponin I (High Sensitivity): 9 ng/L (ref <18)

## 2021-05-23 LAB — RESP PANEL BY RT-PCR (FLU A&B, COVID) ARPGX2
Influenza A by PCR: NEGATIVE
Influenza B by PCR: NEGATIVE
SARS Coronavirus 2 by RT PCR: NEGATIVE

## 2021-05-23 IMAGING — CT CT CERVICAL SPINE W/O CM
3 of 4 series · 12 of 33 positions shown, 14 images · non-contrast
Comparison: CT head [DATE]

CLINICAL DATA: Low blood sugar.  Drank glucagon.  Recent falls.

EXAM:
CT HEAD WITHOUT CONTRAST
CT CERVICAL SPINE WITHOUT CONTRAST
TECHNIQUE: Multidetector CT imaging of the head and cervical spine was
performed following the standard protocol without intravenous
contrast. Multiplanar CT image reconstructions of the cervical spine
were also generated.

[Series 6: sagittal bone · sagittal · 0.32mm/px · 5 of 62 slices shown, 6 images]
[im 21/62  bone]
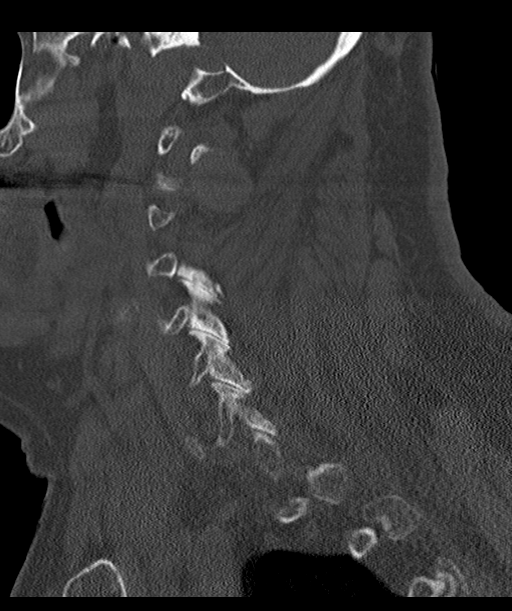
[im 26/62  bone]
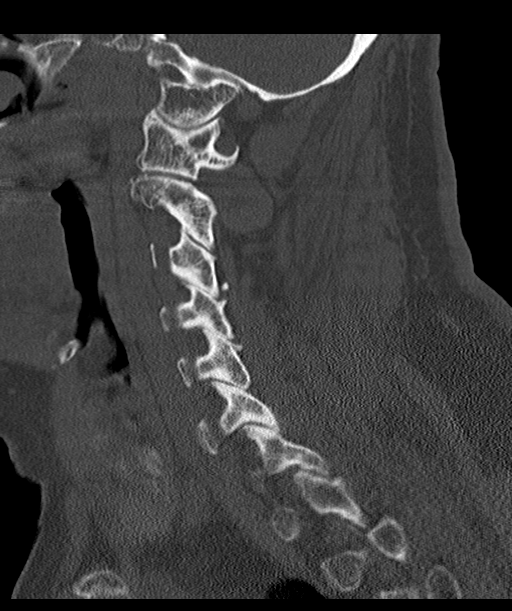
[im 31/62  soft-tissue]
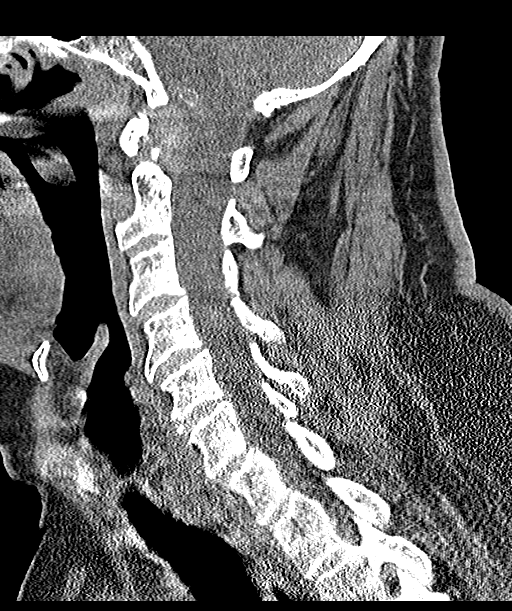
[im 31/62  bone]
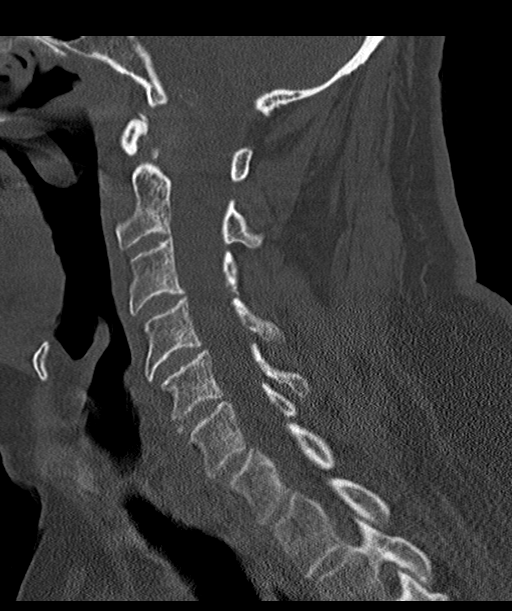
[im 36/62  bone]
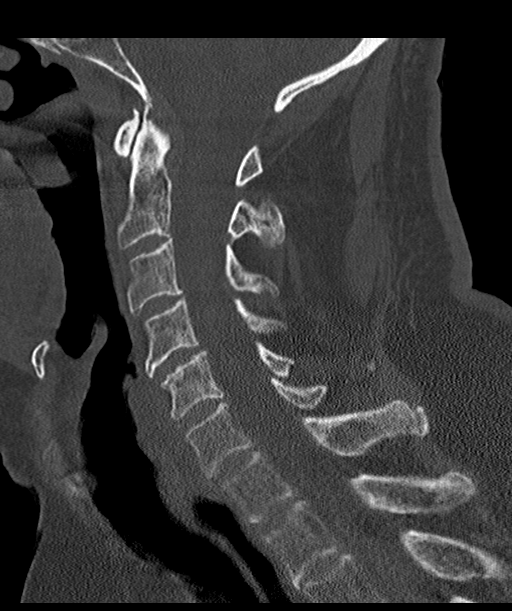
[im 41/62  bone]
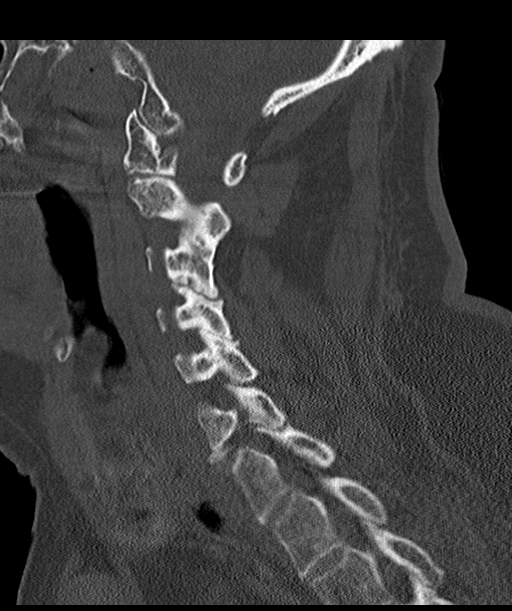

[Series 7: coronal bone · coronal · 0.27mm/px · 3 of 59 slices shown]
[im 12/59  bone]
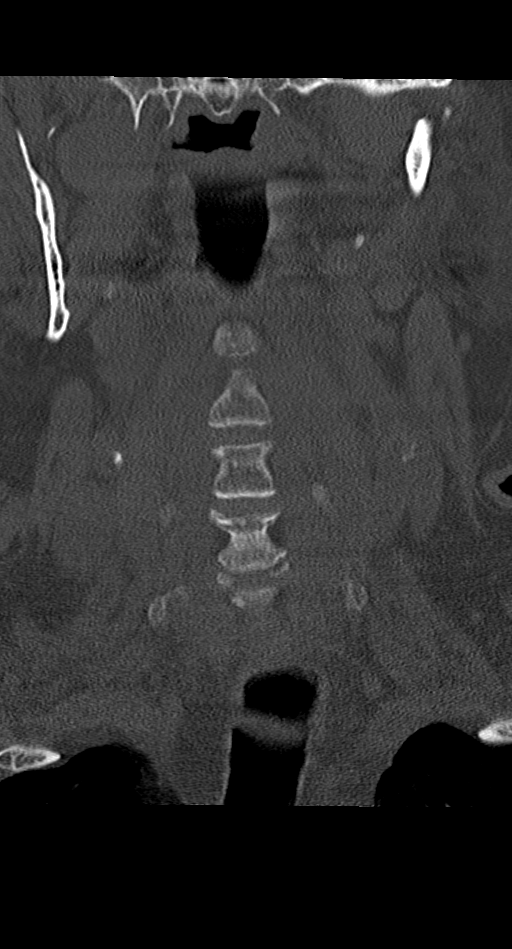
[im 24/59  bone]
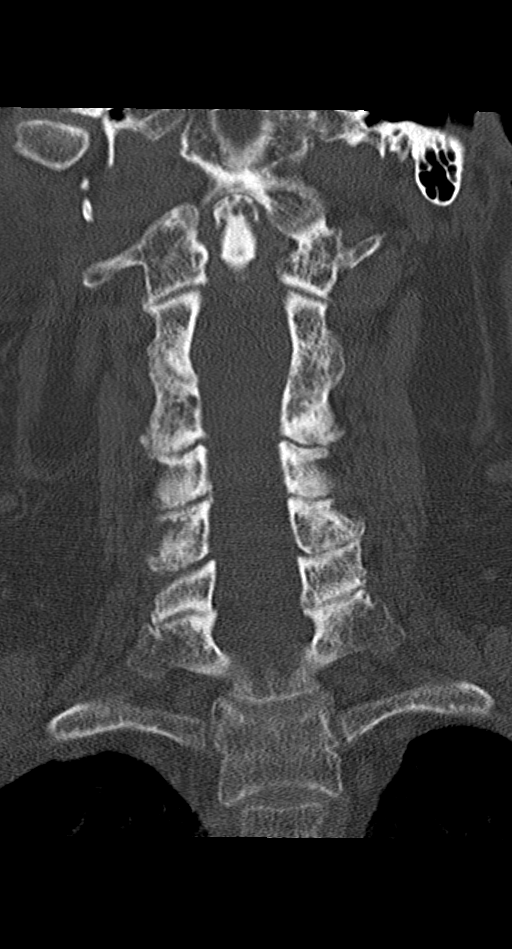
[im 35/59  bone]
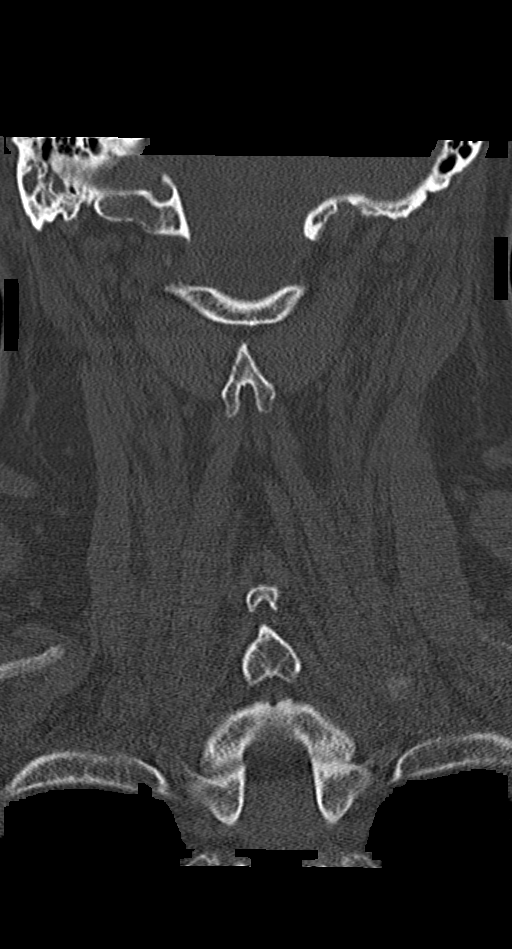

[Series 8: orthogonal bone · axial · 0.23mm/px · z∈[-47,+102]mm · 4 of 114 slices shown, 5 images]
[im 17/114  soft-tissue]
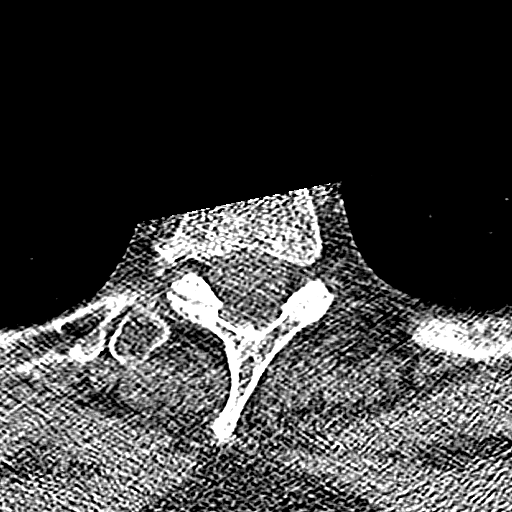
[im 17/114  bone]
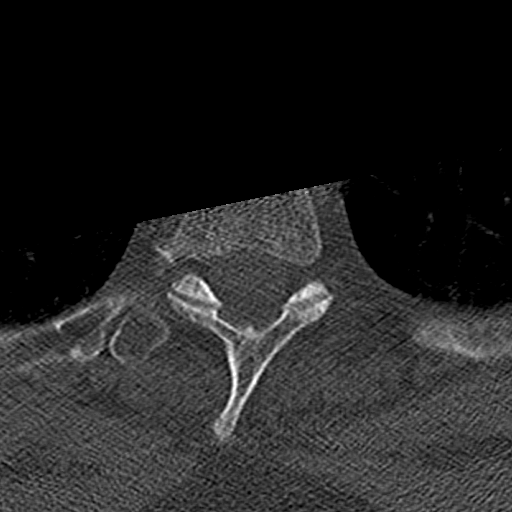
[im 49/114  bone]
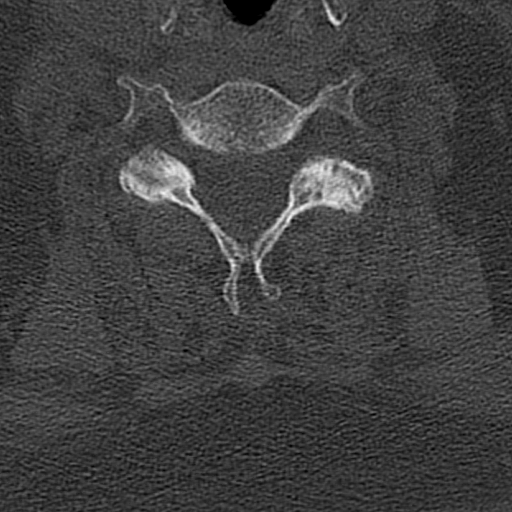
[im 65/114  bone]
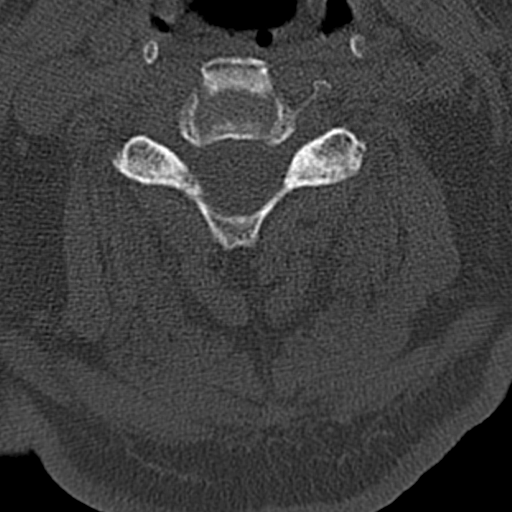
[im 97/114  bone]
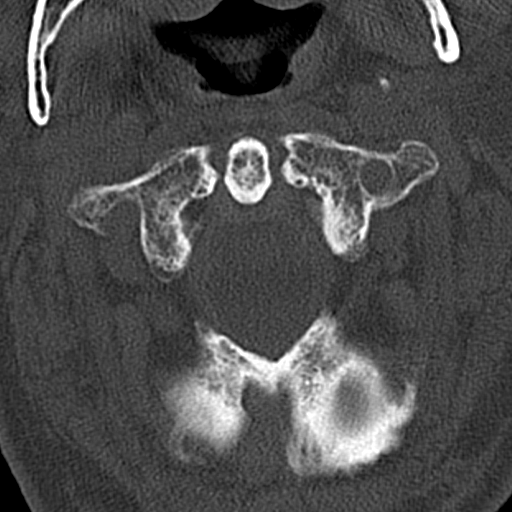

[12 of 33 positions shown; findings below may reference images not displayed]

FINDINGS: CT HEAD FINDINGS

BRAIN:
BRAIN
Cerebral ventricle sizes are concordant with the degree of cerebral
volume loss.

Patchy and confluent areas of decreased attenuation are noted
throughout the deep and periventricular white matter of the cerebral
hemispheres bilaterally, compatible with chronic microvascular
ischemic disease.

No evidence of large-territorial acute infarction. No parenchymal
hemorrhage. No mass lesion. No extra-axial collection.

No mass effect or midline shift. No hydrocephalus. Basilar cisterns
are patent.

Vascular: No hyperdense vessel. Atherosclerotic calcifications are
present within the cavernous internal carotid arteries.

Skull: No acute fracture or focal lesion.

Sinuses/Orbits: Paranasal sinuses and mastoid air cells are clear.
Bilateral lens replacement. Otherwise the orbits are unremarkable.

Other: None.

CT CERVICAL SPINE FINDINGS

Alignment: Normal.

Skull base and vertebrae: Multilevel degenerative changes of the
spine. Associated severe osseous neural foraminal stenosis at the
left C3-C4 level. No severe osseous central canal stenosis. No acute
fracture. No aggressive appearing focal osseous lesion or focal
pathologic process.

Soft tissues and spinal canal: No prevertebral fluid or swelling. No
visible canal hematoma.

Upper chest: Unremarkable.

Other: None.
IMPRESSION: 1. No acute intracranial abnormality.
2. No acute displaced fracture or traumatic listhesis of the
cervical spine.
3. Severe left C3-C4 osseous neural foraminal stenosis in the
setting of degenerative changes.

## 2021-05-23 IMAGING — DX DG CHEST 1V PORT
1 series · 1 of 1 positions shown · non-contrast
Comparison: [DATE], [DATE]

CLINICAL DATA: Recurrent fall, weakness

EXAM:
PORTABLE CHEST 1 VIEW

[chest ap]
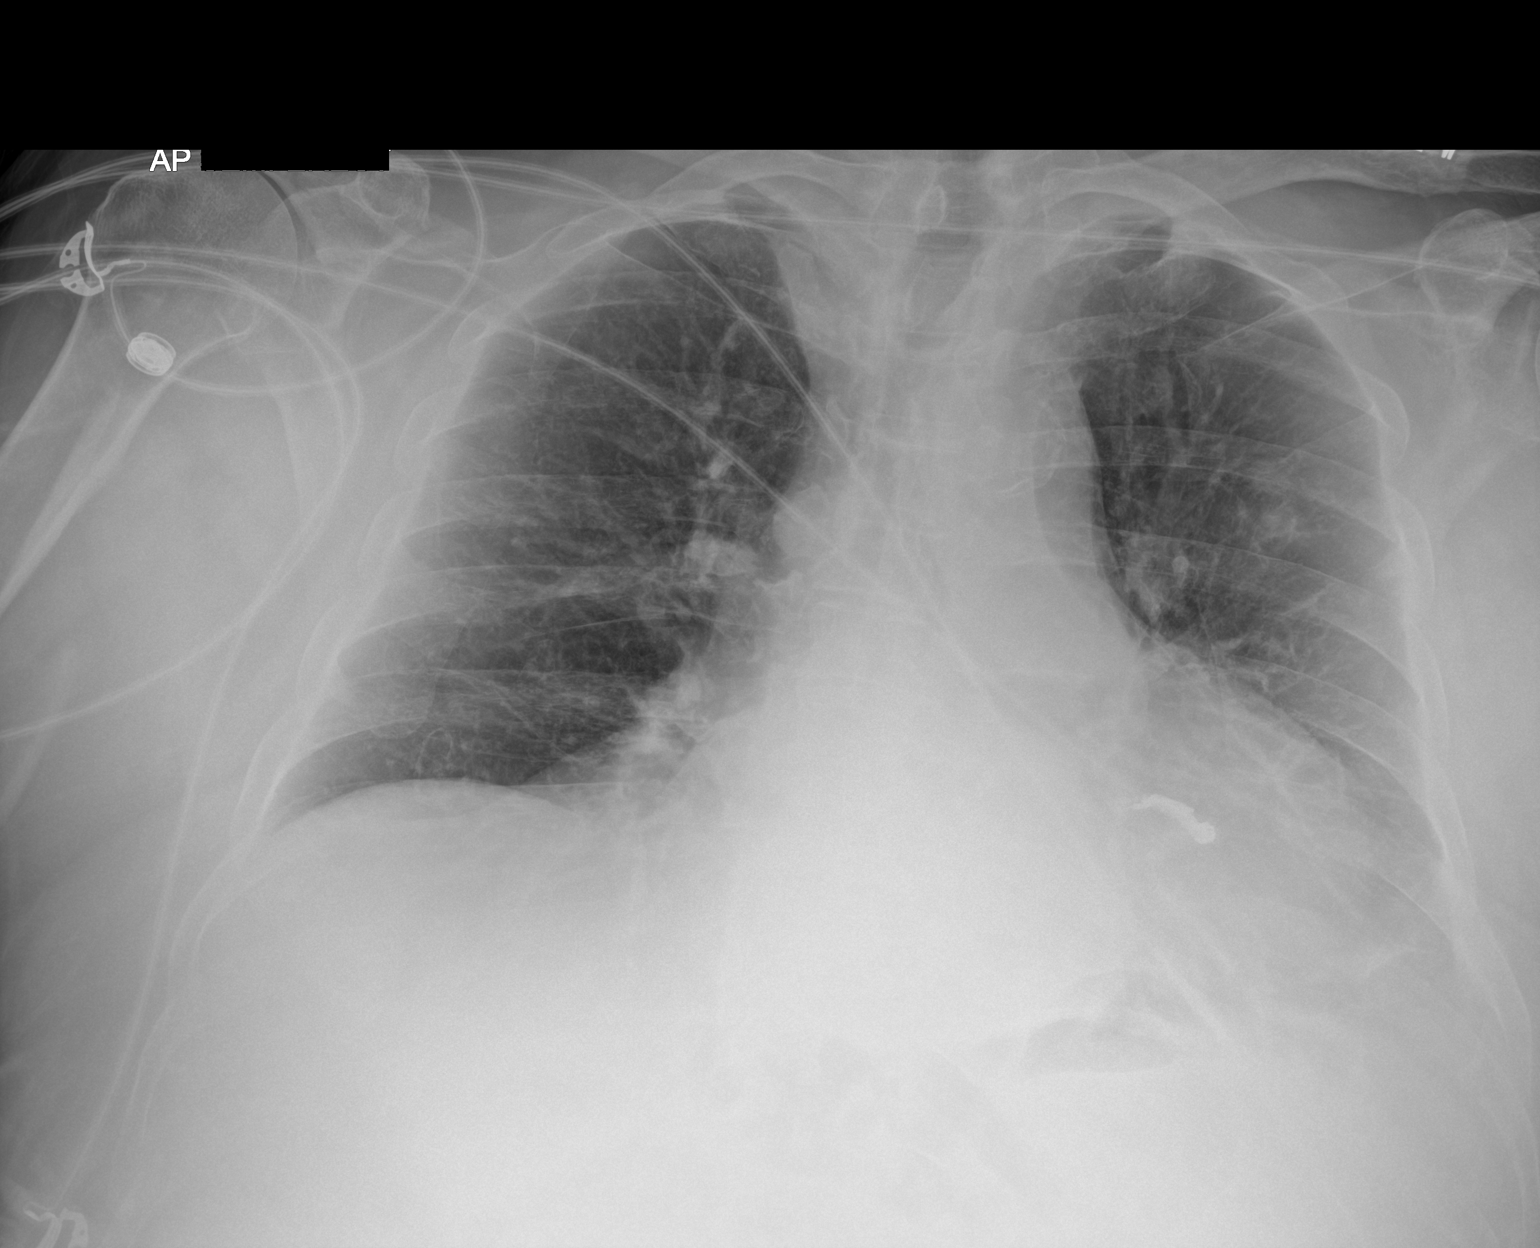

[1 of 1 positions shown; findings below may reference images not displayed]

FINDINGS: Cardiomegaly with aortic atherosclerosis. Right lung grossly clear.
Possible patchy nodular opacity in the left upper lung. Scarring and
or atelectasis left base.
IMPRESSION: 1. Cardiomegaly with probable scarring at the left base.
2. Patchy nodular opacities in the left upper lung possibly due to
small foci of infection or inflammation. Short interval two-view
chest radiographic follow-up recommended

## 2021-05-23 IMAGING — CT CT HEAD W/O CM
3 series · 15 of 47 positions shown, 18 images · non-contrast
Comparison: CT head [DATE]

CLINICAL DATA: Low blood sugar.  Drank glucagon.  Recent falls.

EXAM:
CT HEAD WITHOUT CONTRAST
CT CERVICAL SPINE WITHOUT CONTRAST
TECHNIQUE: Multidetector CT imaging of the head and cervical spine was
performed following the standard protocol without intravenous
contrast. Multiplanar CT image reconstructions of the cervical spine
were also generated.

[Series 2: head wo · axial · 0.42mm/px · z∈[+156,+286]mm · 9 of 32 slices shown, 12 images]
[im 3/32  brain]
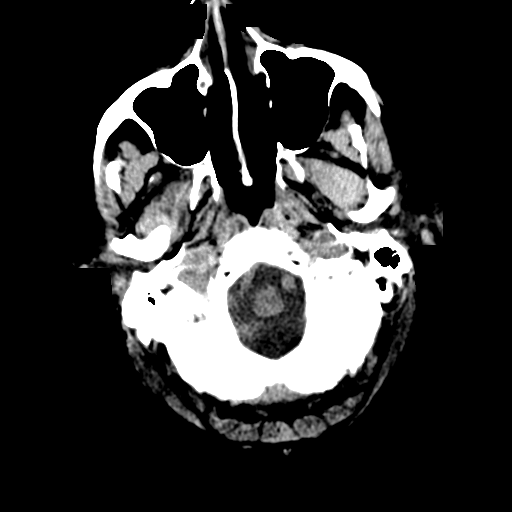
[im 3/32  bone]
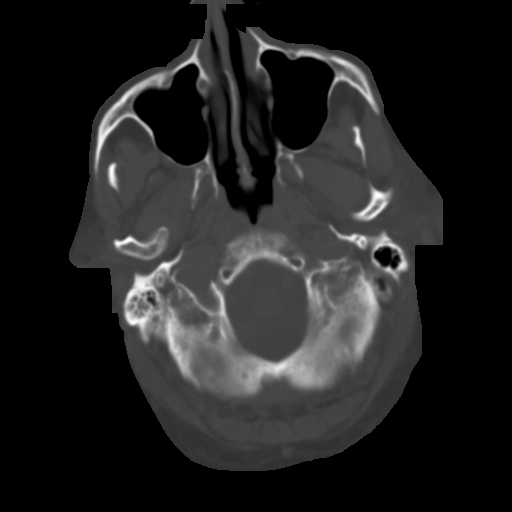
[im 6/32  brain]
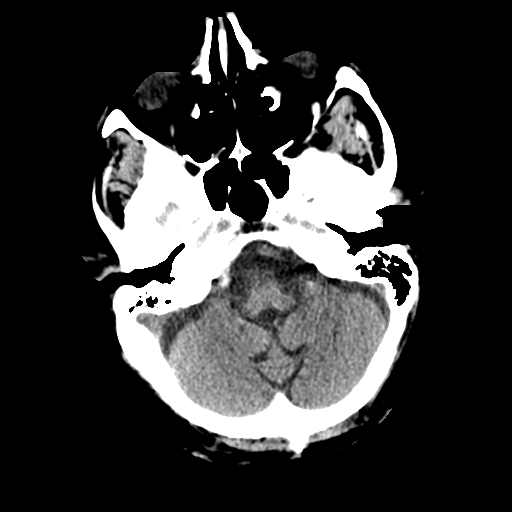
[im 9/32  brain]
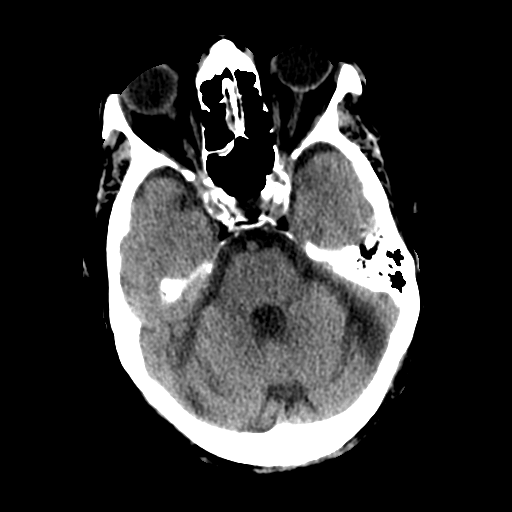
[im 12/32  brain]
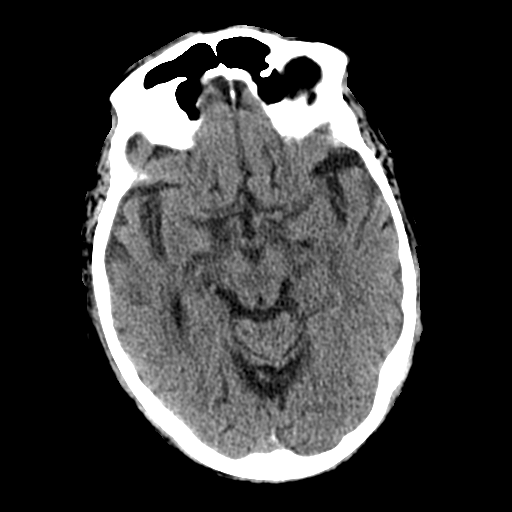
[im 17/32  brain]
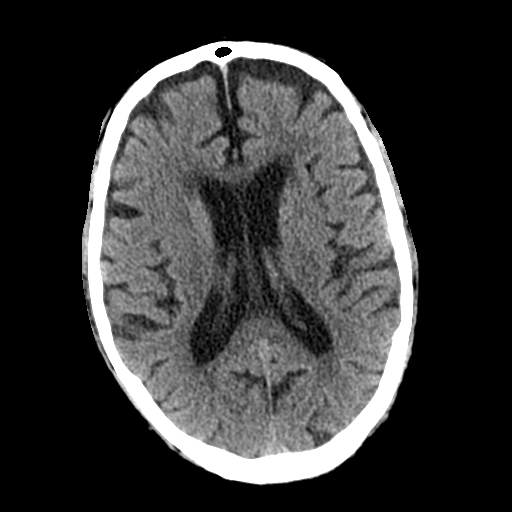
[im 17/32  bone]
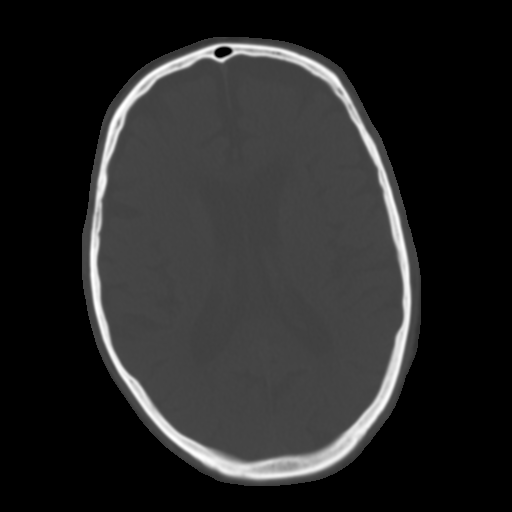
[im 20/32  brain]
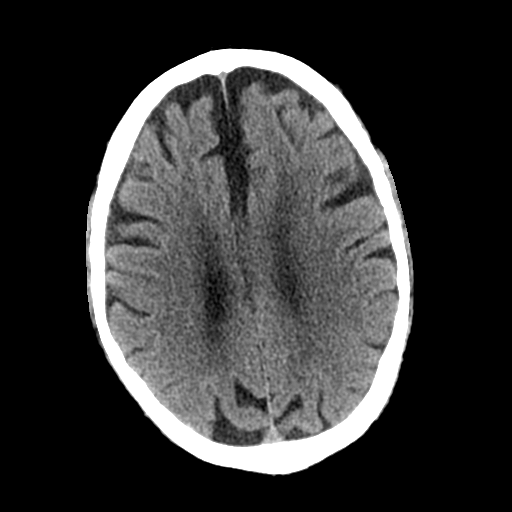
[im 23/32  brain]
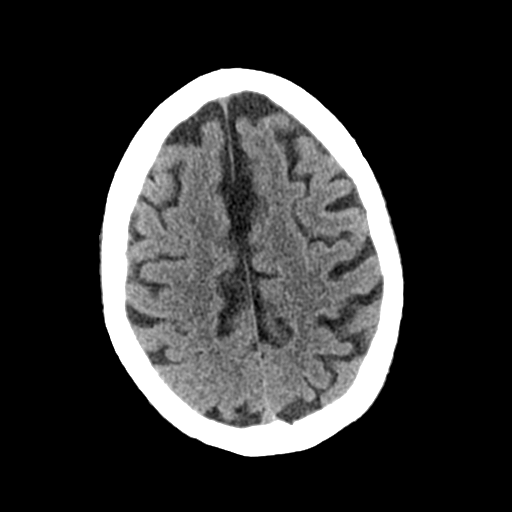
[im 26/32  brain]
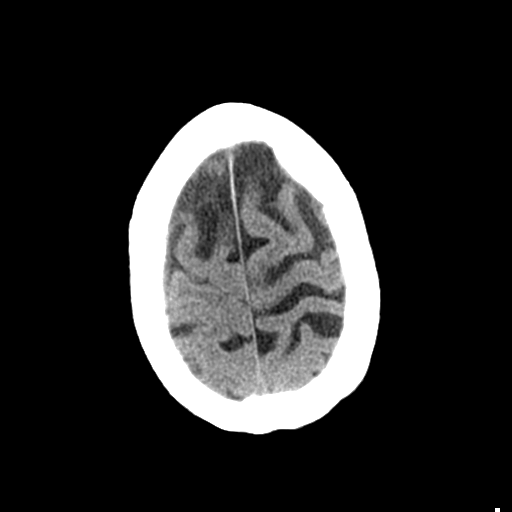
[im 29/32  brain]
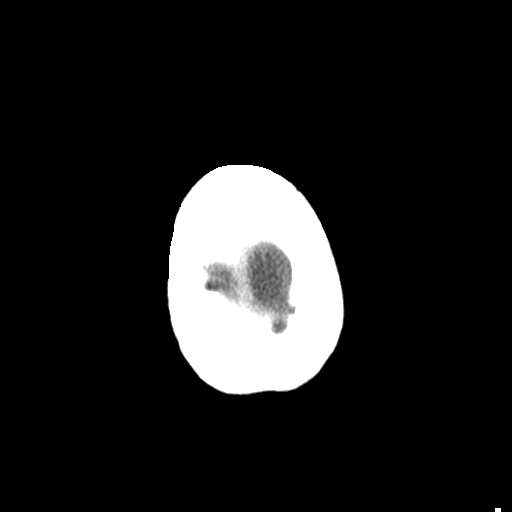
[im 29/32  bone]
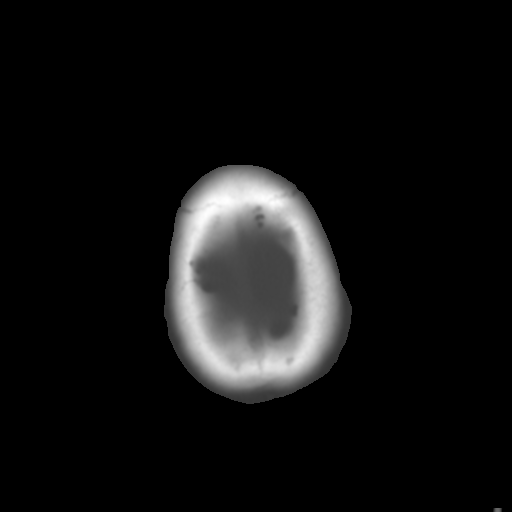

[Series 4: coronal soft tissue · coronal · 0.32mm/px · 3 of 70 slices shown]
[im 24/70  brain]
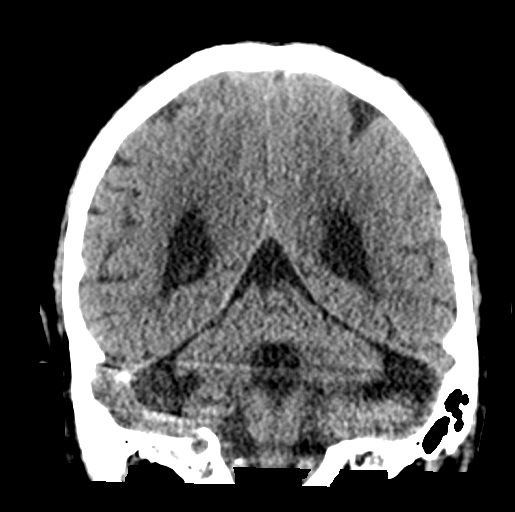
[im 31/70  brain]
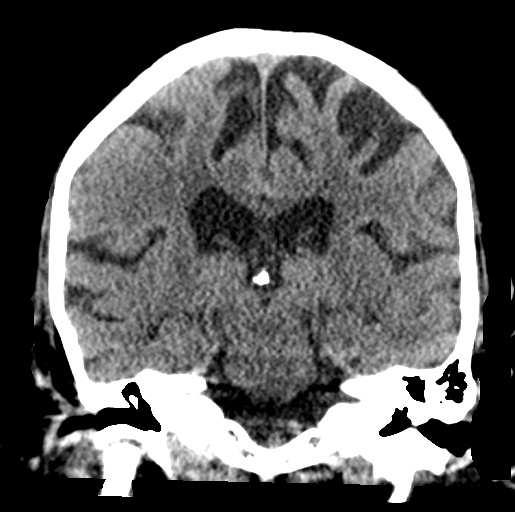
[im 39/70  brain]
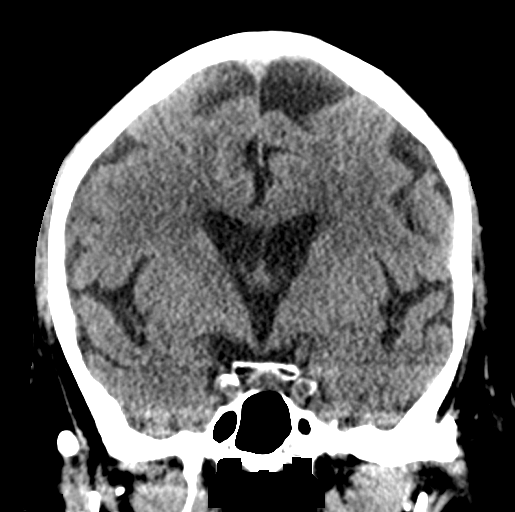

[Series 5: sagittal soft tissue · sagittal · 0.32mm/px · 3 of 55 slices shown]
[im 19/55  brain]
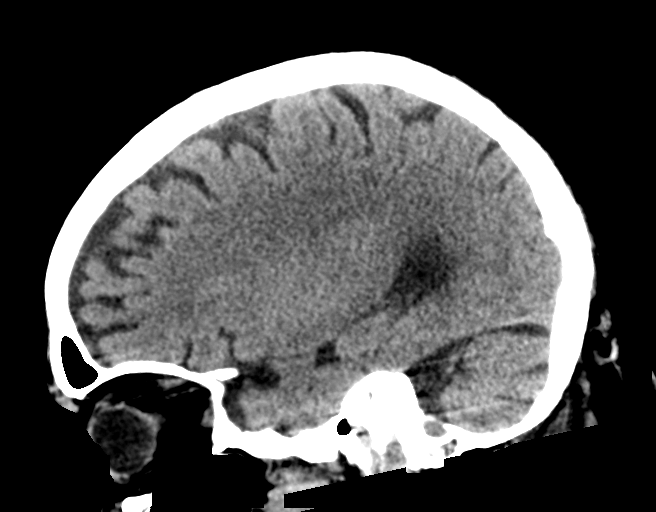
[im 28/55  brain]
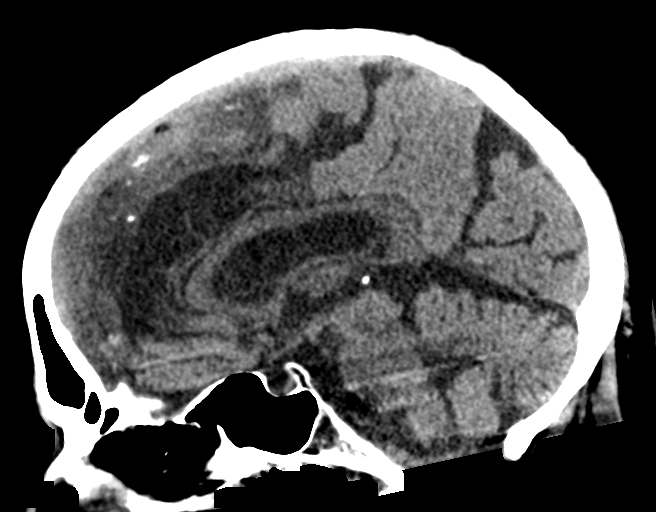
[im 37/55  brain]
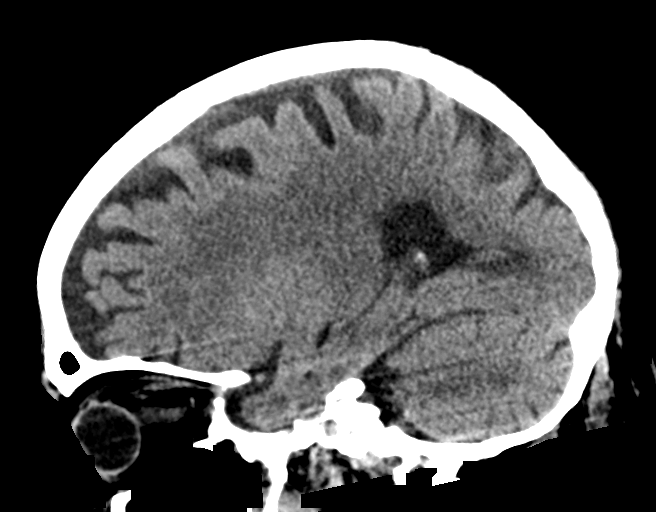

[15 of 47 positions shown; findings below may reference images not displayed]

FINDINGS: CT HEAD FINDINGS

BRAIN:
BRAIN
Cerebral ventricle sizes are concordant with the degree of cerebral
volume loss.

Patchy and confluent areas of decreased attenuation are noted
throughout the deep and periventricular white matter of the cerebral
hemispheres bilaterally, compatible with chronic microvascular
ischemic disease.

No evidence of large-territorial acute infarction. No parenchymal
hemorrhage. No mass lesion. No extra-axial collection.

No mass effect or midline shift. No hydrocephalus. Basilar cisterns
are patent.

Vascular: No hyperdense vessel. Atherosclerotic calcifications are
present within the cavernous internal carotid arteries.

Skull: No acute fracture or focal lesion.

Sinuses/Orbits: Paranasal sinuses and mastoid air cells are clear.
Bilateral lens replacement. Otherwise the orbits are unremarkable.

Other: None.

CT CERVICAL SPINE FINDINGS

Alignment: Normal.

Skull base and vertebrae: Multilevel degenerative changes of the
spine. Associated severe osseous neural foraminal stenosis at the
left C3-C4 level. No severe osseous central canal stenosis. No acute
fracture. No aggressive appearing focal osseous lesion or focal
pathologic process.

Soft tissues and spinal canal: No prevertebral fluid or swelling. No
visible canal hematoma.

Upper chest: Unremarkable.

Other: None.
IMPRESSION: 1. No acute intracranial abnormality.
2. No acute displaced fracture or traumatic listhesis of the
cervical spine.
3. Severe left C3-C4 osseous neural foraminal stenosis in the
setting of degenerative changes.

## 2021-05-23 MED ORDER — ASPIRIN EC 81 MG PO TBEC: 81.0000 mg | DELAYED_RELEASE_TABLET | Freq: Every day | ORAL | Status: DC

## 2021-05-23 MED ORDER — QUETIAPINE FUMARATE 25 MG PO TABS: 50.0000 mg | ORAL_TABLET | Freq: Every day | ORAL | Status: DC

## 2021-05-23 MED ORDER — AMLODIPINE BESYLATE 5 MG PO TABS: 5.0000 mg | ORAL_TABLET | Freq: Every day | ORAL | Status: DC

## 2021-05-23 MED ORDER — DOXAZOSIN MESYLATE 1 MG PO TABS
1.0000 mg | ORAL_TABLET | Freq: Every day | ORAL | Status: DC
  Filled 2021-05-23: qty 1

## 2021-05-23 MED ORDER — PRAVASTATIN SODIUM 20 MG PO TABS: 20.0000 mg | ORAL_TABLET | Freq: Every evening | ORAL | Status: DC

## 2021-05-23 NOTE — ED Notes (Signed)
Pt to ct scan.

## 2021-05-23 NOTE — ED Provider Notes (Signed)
[facility] Emergency Department Provider Note  ____________________________________________   Event Date/Time   First MD Initiated Contact with Patient 05/23/21 1618     (approximate)  I have reviewed the triage vital signs and the nursing notes.   HISTORY  Chief Complaint Hypoglycemia   HPI Alex Smith is a 86 y.o. male with medical history including but not limited to recent accidental/mechanical fall in [place] on 04/29/2021, hypertension, insulin-dependent diabetes mellitus type 2, morbid obesity, BPH, GERD, CKD stage III hyperlipidemia, history of pneumonia, history of squamous cell carcinoma in situ, history of basal cell carcinoma of face, vitamin B12 deficiency, colonic polyps and recent admission 9/12-9/23 for encephalopathy from community-acquired pneumonia discharge 9/23 who presents for assessment of low blood sugar in the nursing facility earlier today.  Per report patient's blood sugar was in the 40s and following this he was given some glucagon and fed some juice and crackers.  Subsequent recheck was 132.  On arrival patient states he does not know exactly what happened but his sugar was low.  He is not sure if he got his insulin his morning or not.  He does note he fell yesterday but never any pain from this.  He denies any other acute sick symptoms or further history is limited as he is not oriented to year.  Seems baseline over last couple weeks.         Past Medical History:  Diagnosis Date   Anemia    B12 deficiency    Basal cell carcinoma 03/30/2018   left ant nasal ala   Basal cell carcinoma of face 01/19/2013   right distal dorsum nose   BPH (benign prostatic hypertrophy)    followed by urology, discharged (Dr. 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 pretibial. 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 EDC 09/07/2020   Squamous cell carcinoma of skin 04/03/2020   Left cheek. WD SCC   Systolic murmur 28/76/8115   Type 2 diabetes, controlled, with retinopathy [facility])     Patient Active Problem List   Diagnosis Date Noted   Severe sepsis ([place]) 05/04/2021   CAP (community acquired pneumonia) 05/02/2021   Vitamin D deficiency 03/10/2019   Depressed mood 03/10/2019   CKD stage 3 due to type 2 diabetes mellitus ([place]) 11/14/2017   Goals of care, counseling/discussion 05/27/2017   Pancytopenia due to antineoplastic chemotherapy ([place]) 11/17/2016   Chemotherapy-induced neutropenia ([place]) 09/22/2016   Urinary incontinence 72/62/0355   Systolic murmur 97/41/6384   Myelodysplastic syndrome, low grade ([place]) 02/05/2016   Multiple myeloma not having achieved remission ([place]) 02/05/2016   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   Myelodysplasia present in bone marrow ([place]) 11/03/2015   Advanced care planning/counseling discussion 05/22/2015   Insomnia 03/03/2015   Monoclonal gammopathy 02/03/2015   Macrocytic anemia 02/03/2015   Severe obesity (BMI 35.0-39.9) with  comorbidity ([place]) 10/12/2014   History of nonmelanoma skin cancer 10/10/2014   Medicare annual wellness visit, subsequent 04/12/2014   Health maintenance examination 04/12/2014   Hairy cell leukemia, in remission ([place])    B12 deficiency    BPH with obstruction/lower urinary tract symptoms    Essential hypertension    GERD (gastroesophageal reflux disease)    Dyslipidemia    Type 2 diabetes, controlled,  with retinopathy ([place])    Diverticulosis    Macular hole 07/07/2012    Past Surgical History:  Procedure Laterality Date   25 GAUGE PARS PLANA VITRECTOMY WITH 20 GAUGE MVR PORT FOR MACULAR HOLE  07/07/2012   Procedure: 25 GAUGE PARS PLANA VITRECTOMY WITH 20 GAUGE MVR PORT FOR MACULAR HOLE;  Surgeon: Hayden Pedro, MD;  Location: [place];  Service: Ophthalmology;  Laterality: Left;   BONE MARROW BIOPSY  2016   CARDIOVASCULAR STRESS TEST  2013   treadmill - no evidence ischemia, EF 61%   CATARACT EXTRACTION W/ INTRAOCULAR LENS  IMPLANT, BILATERAL  ~ 2010   COLONOSCOPY  2014   [place] WNL no rpt needed, h/o polyps   EYE SURGERY Left 06/2012   laser surgery   GAS INSERTION  07/07/2012   Procedure: INSERTION OF GAS;  Surgeon: Hayden Pedro, MD;  Location: [place];  Service: Ophthalmology;  Laterality: Left;  C3F8   PERIPHERAL VASCULAR CATHETERIZATION N/A 02/23/2015   Procedure: Glori Luis Cath Insertion;  Surgeon: Algernon Huxley, MD;  Location: [place] [facility];  Service: Cardiovascular;  Laterality: N/A;   PORTA CATH REMOVAL N/A 02/16/2020   Procedure: PORTA CATH REMOVAL;  Surgeon: Algernon Huxley, MD;  Location: [place] [facility];  Service: Cardiovascular;  Laterality: N/A;   SERUM PATCH  07/07/2012   Procedure: SERUM PATCH;  Surgeon: Hayden Pedro, MD;  Location: [place];  Service: Ophthalmology;  Laterality: Left;   SKIN CANCER EXCISION     "all over my face" (07/07/2012)    Prior to Admission medications   Medication Sig Start Date End Date Taking? Authorizing Provider  amLODipine (NORVASC) 5 MG tablet Take 1 tablet (5 mg total) by mouth daily. 05/12/21   Dessa Phi, DO  aspirin EC 81 MG tablet Take 81 mg by mouth daily.    [provider]  doxazosin (CARDURA) 1 MG tablet TAKE ONE TABLET BY MOUTH EVERY DAY 08/31/19   Ria Bush, MD  famotidine (PEPCID) 40 MG tablet Take 0.5 tablets (20 mg total) by mouth daily. NEEDS OFFICE VISIT 07/16/20   Ria Bush, MD  glucose  blood (ACCU-CHEK AVIVA PLUS) test strip USE TO CHECK SUGAR DAILY E11.319 08/28/18   Ria Bush, MD  HUMULIN N 100 UNIT/ML injection INJECT 39 UNITS SUBCUTANEOUSLY TWICE DAILY BEFORE A MEAL Patient taking differently: Inject 1 Units into the skin as directed. Pt uses this as sliding scale 06/23/20   Ria Bush, MD  HUMULIN R 100 UNIT/ML injection INJECT 10 UNITS INTO THE SKIN TWICE DAILY WITH MEALS 11/09/19   Ria Bush, MD  lisinopril (ZESTRIL) 20 MG tablet TAKE ONE TABLET BY MOUTH TWICE DAILY 03/01/20   Ria Bush, MD  Microlet Lancets MISC USE AS DIRECTED 10/28/19   Ria Bush, MD  omeprazole (PRILOSEC) 20 MG capsule Take 1 capsule by mouth 1 day or 1 dose.    [provider]  pravastatin (PRAVACHOL) 20 MG tablet TAKE ONE TABLET EVERY EVENING 03/16/20   Ria Bush, MD  QUEtiapine (SEROQUEL) 50 MG tablet Take 1 tablet (50 mg total)  by mouth at bedtime. 05/11/21   Dessa Phi, DO  tamsulosin (FLOMAX) 0.4 MG CAPS capsule TAKE 1 CAPSULE BY MOUTH EVERY DAY 12/11/17   Ria Bush, MD  traZODone (DESYREL) 50 MG tablet TAKE 1/2 TO 1 TABLET BY MOUTH AT [facility] NEEDED FOR SLEEP 01/23/17   Ria Bush, MD  Flossie Buffy INSULIN SYRINGE 31G X 5/16" 0.5 ML MISC See admin instructions. 01/24/20   [provider]  vitamin B-12 (CYANOCOBALAMIN) 1000 MCG tablet Take 1 tablet (1,000 mcg total) by mouth daily. 06/01/19   Ria Bush, MD  vitamin E 400 UNIT capsule Take 200 Units by mouth daily.     [provider]    Allergies Rituximab, Blood-group specific substance, Primaxin [imipenem], Voriconazole, Sulfa antibiotics, and Sulfacetamide sodium  Family History  Problem Relation Age of Onset   Dementia Mother    Heart failure Father 49   Cancer Sister        breast   Diabetes Paternal Uncle    Diabetes Paternal Aunt    CAD Brother 61       MI   Stroke Neg Hx     Social History Social History   Tobacco Use   Smoking status:  Former    Packs/day: 2.00    Years: 25.00    Pack years: 50.00    Types: Cigarettes    Quit date: 02/06/1969    Years since quitting: 52.3   Smokeless tobacco: Never   Tobacco comments:    07/07/2012 "stopped smoking ~ 40 yr ago; smoked 20-27yr  Vaping Use   Vaping Use: Never used  Substance Use Topics   Alcohol use: Yes    Alcohol/week: 0.0 standard drinks    Comment: rare   Drug use: No    Review of Systems  Review of Systems  Constitutional:  Negative for chills and fever.  HENT:  Negative for sore throat.   Eyes:  Negative for pain.  Respiratory:  Negative for cough and stridor.   Cardiovascular:  Negative for chest pain.  Gastrointestinal:  Negative for vomiting.  Musculoskeletal:  Positive for falls.  Skin:  Negative for rash.  Neurological:  Negative for seizures, loss of consciousness and headaches.  Psychiatric/Behavioral:  Negative for suicidal ideas.   All other systems reviewed and are negative.    ____________________________________________   PHYSICAL EXAM:  VITAL SIGNS: ED Triage Vitals  Enc Vitals Group     BP      Pulse      Resp      Temp      Temp src      SpO2      Weight      Height      Head Circumference      Peak Flow      Pain Score      Pain Loc      Pain Edu?      Excl. in [place]    Vitals:   05/23/21 1632  BP: (!) 144/71  Pulse: 80  Resp: 20  Temp: 98.3 F (36.8 C)  SpO2: 98%   Physical Exam Vitals and nursing note reviewed.  Constitutional:      Appearance: He is well-developed.  HENT:     Head: Normocephalic and atraumatic.     Right Ear: External ear normal.     Left Ear: External ear normal.     Nose: Nose normal.  Eyes:     Conjunctiva/sclera: Conjunctivae normal.  Cardiovascular:     Rate and  Rhythm: Normal rate and regular rhythm.     Heart sounds: No murmur heard. Pulmonary:     Effort: Pulmonary effort is normal. No respiratory distress.     Breath sounds: Normal breath sounds.  Abdominal:      Palpations: Abdomen is soft.     Tenderness: There is no abdominal tenderness.  Musculoskeletal:     Cervical back: Neck supple.  Skin:    General: Skin is warm and dry.  Neurological:     Mental Status: He is alert. Mental status is at baseline. He is disoriented and confused.  Psychiatric:        Mood and Affect: Mood normal.    No obvious trauma over the C/T/L-spine.  Cranial nerves II through XII are grossly intact.  Patient has no obvious trauma or tenderness to the bilateral upper or lower extremities.  2+ radial and DP pulses.  Abdomen is soft nontender throughout. ____________________________________________   LABS (all labs ordered are listed, but only abnormal results are displayed)  Labs Reviewed  CBC WITH DIFFERENTIAL/PLATELET - Abnormal; Notable for the following components:      Result Value   WBC 3.9 (*)    RBC 2.49 (*)    Hemoglobin 8.8 (*)    HCT 26.8 (*)    MCV 107.6 (*)    MCH 35.3 (*)    Platelets 125 (*)    Lymphs Abs 0.6 (*)    All other components within normal limits  COMPREHENSIVE METABOLIC PANEL - Abnormal; Notable for the following components:   Glucose, Bld 107 (*)    Creatinine, Ser 1.61 (*)    Calcium 8.5 (*)    Albumin 3.1 (*)    GFR, Estimated 42 (*)    Anion gap 4 (*)    All other components within normal limits  CBG MONITORING, ED - Abnormal; Notable for the following components:   Glucose-Capillary 122 (*)    All other components within normal limits  RESP PANEL BY RT-PCR (FLU A&B, COVID) ARPGX2  URINALYSIS, COMPLETE (UACMP) WITH MICROSCOPIC  CBG MONITORING, ED  CBG MONITORING, ED  CBG MONITORING, ED  CBG MONITORING, ED  CBG MONITORING, ED  TROPONIN I (HIGH SENSITIVITY)  TROPONIN I (HIGH SENSITIVITY)   ____________________________________________  EKG  Sinus rhythm with a ventricular rate of 78, PR interval of 245 and Q waves in septal leads without other clearance of acute ischemia or significant  arrhythmia. ____________________________________________  RADIOLOGY  ED MD interpretation: CT head has no evidence of skull fracture, intracranial hemorrhage or subacute CVA or other acute thoracic process.  CT C-spine shows some severe foraminal stenosis but no evidence of acute C-spine injury.  Chest x-ray shows some cardiomegaly and scarring of the left base.  There is little patchy opacities at left upper lobe but no other obvious foci of infection, pneumothorax, overt edema or other clear acute thoracic process.  Official radiology report(s): CT HEAD WO CONTRAST (5MM)  Result Date: 05/23/2021 CLINICAL DATA:  Low blood sugar.  Drank glucagon.  Recent falls. EXAM: CT HEAD WITHOUT CONTRAST CT CERVICAL SPINE WITHOUT CONTRAST TECHNIQUE: Multidetector CT imaging of the head and cervical spine was performed following the standard protocol without intravenous contrast. Multiplanar CT image reconstructions of the cervical spine were also generated. COMPARISON:  CT head 05/01/2021 FINDINGS: CT HEAD FINDINGS BRAIN: BRAIN Cerebral ventricle sizes are concordant with the degree of cerebral volume loss. Patchy and confluent areas of decreased attenuation are noted throughout the deep and periventricular white matter of the cerebral  hemispheres bilaterally, compatible with chronic microvascular ischemic disease. No evidence of large-territorial acute infarction. No parenchymal hemorrhage. No mass lesion. No extra-axial collection. No mass effect or midline shift. No hydrocephalus. Basilar cisterns are patent. Vascular: No hyperdense vessel. Atherosclerotic calcifications are present within the cavernous internal carotid arteries. Skull: No acute fracture or focal lesion. Sinuses/Orbits: Paranasal sinuses and mastoid air cells are clear. Bilateral lens replacement. Otherwise the orbits are unremarkable. Other: None. CT CERVICAL SPINE FINDINGS Alignment: Normal. Skull base and vertebrae: Multilevel degenerative  changes of the spine. Associated severe osseous neural foraminal stenosis at the left C3-C4 level. No severe osseous central canal stenosis. No acute fracture. No aggressive appearing focal osseous lesion or focal pathologic process. Soft tissues and spinal canal: No prevertebral fluid or swelling. No visible canal hematoma. Upper chest: Unremarkable. Other: None. IMPRESSION: 1. No acute intracranial abnormality. 2. No acute displaced fracture or traumatic listhesis of the cervical spine. 3. Severe left C3-C4 osseous neural foraminal stenosis in the setting of degenerative changes. Electronically Signed   By: Iven Finn M.D.   On: 05/23/2021 17:02   CT Cervical Spine Wo Contrast  Result Date: 05/23/2021 CLINICAL DATA:  Low blood sugar.  Drank glucagon.  Recent falls. EXAM: CT HEAD WITHOUT CONTRAST CT CERVICAL SPINE WITHOUT CONTRAST TECHNIQUE: Multidetector CT imaging of the head and cervical spine was performed following the standard protocol without intravenous contrast. Multiplanar CT image reconstructions of the cervical spine were also generated. COMPARISON:  CT head 05/01/2021 FINDINGS: CT HEAD FINDINGS BRAIN: BRAIN Cerebral ventricle sizes are concordant with the degree of cerebral volume loss. Patchy and confluent areas of decreased attenuation are noted throughout the deep and periventricular white matter of the cerebral hemispheres bilaterally, compatible with chronic microvascular ischemic disease. No evidence of large-territorial acute infarction. No parenchymal hemorrhage. No mass lesion. No extra-axial collection. No mass effect or midline shift. No hydrocephalus. Basilar cisterns are patent. Vascular: No hyperdense vessel. Atherosclerotic calcifications are present within the cavernous internal carotid arteries. Skull: No acute fracture or focal lesion. Sinuses/Orbits: Paranasal sinuses and mastoid air cells are clear. Bilateral lens replacement. Otherwise the orbits are unremarkable. Other:  None. CT CERVICAL SPINE FINDINGS Alignment: Normal. Skull base and vertebrae: Multilevel degenerative changes of the spine. Associated severe osseous neural foraminal stenosis at the left C3-C4 level. No severe osseous central canal stenosis. No acute fracture. No aggressive appearing focal osseous lesion or focal pathologic process. Soft tissues and spinal canal: No prevertebral fluid or swelling. No visible canal hematoma. Upper chest: Unremarkable. Other: None. IMPRESSION: 1. No acute intracranial abnormality. 2. No acute displaced fracture or traumatic listhesis of the cervical spine. 3. Severe left C3-C4 osseous neural foraminal stenosis in the setting of degenerative changes. Electronically Signed   By: Iven Finn M.D.   On: 05/23/2021 17:02   DG Chest Portable 1 View  Result Date: 05/23/2021 CLINICAL DATA:  Recurrent fall, weakness EXAM: PORTABLE CHEST 1 VIEW COMPARISON:  05/01/2021, 01/31/2015 FINDINGS: Cardiomegaly with aortic atherosclerosis. Right lung grossly clear. Possible patchy nodular opacity in the left upper lung. Scarring and or atelectasis left base. IMPRESSION: 1. Cardiomegaly with probable scarring at the left base. 2. Patchy nodular opacities in the left upper lung possibly due to small foci of infection or inflammation. Short interval two-view chest radiographic follow-up recommended Electronically Signed   By: Donavan Foil M.D.   On: 05/23/2021 17:27    ____________________________________________   PROCEDURES  Procedure(s) performed (including Critical Care):  Procedures   ____________________________________________   INITIAL IMPRESSION /  ASSESSMENT AND PLAN / ED COURSE     Patient presents with above-stated history exam for assessment of an episode of hypoglycemia that occurred earlier today.  Patient is unable provide significant history secondary to some baseline confusion which was confirmed by son who later worked at bedside.  Apparently also patient fell  yesterday although patient is out of bed and any details regarding this.  He denies any acute concerns on arrival.  He has been able tolerate p.o. since arriving emergency room.  He has been hemodynamically stable.  There is no obvious foci of infection or trauma on exam.  However given his age and status I did obtain a CT head C-spine and chest x-ray to assess for any evidence of acute injury from fall yesterday.  CT head has no evidence of skull fracture, intracranial hemorrhage or subacute CVA or other acute thoracic process.  CT C-spine shows some severe foraminal stenosis but no evidence of acute C-spine injury.  Chest x-ray shows some cardiomegaly and scarring of the left base.  There is little patchy opacities at left upper lobe but no other obvious foci of infection, pneumothorax, overt edema or other clear acute thoracic process.  ECG and nonelevated troponin are not suggestive of ACS or arrhythmia today.  CMP shows no significant electrolyte or metabolic derangements.  CBC shows WBC count of 3.9 target for 2 weeks ago hemoglobin of 8.8 compared to 10.5 it seems patient's baseline is around 10.  Platelets are 125.  Had some discussion with patient's son about reason for fall yesterday and hyperglycemia today.  Patient's son is very concerned that the Xanax he has been getting for restlessness is contributing to his falls.  I think this is very likely as Xanax can be quite dangerous in this age group and not only cause some increased confusion but increased risk of falls.  I did provide instructions in writing to have this discontinued is a very low-dose of 0.25 I think it is reasonable for him to follow-up with his PCP to see if they can find alternative that would be less sedating and risky for fall.  Muscle concerned that he may have gotten some insulin before eating today and this may have precipitated hypoglycemia.  I did provide instructions in writing for discussion with son that patient  should not receive insulin until after eating especially with long-acting insulin.  He was monitored emergency room for over 3 hours and had no hypoglycemia recurrence and was able to tolerate p.o.  Given stable vitals with eyes reassuring exam work-up with patient tolerating p.o. and no further episodes of hypoglycemia I think he is stable for discharge back to his nursing facility.  Discharged in stable condition.     ____________________________________________   FINAL CLINICAL IMPRESSION(S) / ED DIAGNOSES  Final diagnoses:  Fall, initial encounter  Hypoglycemia    Medications - No data to display   ED Discharge Orders     None        Note:  This document was prepared using Dragon voice recognition software and may include unintentional dictation errors.    Lucrezia Starch, MD 05/23/21 [phone]

## 2021-05-23 NOTE — ED Notes (Addendum)
Pt brought in via ems with altered mental status.  Pt had a low blood sugar on the scene.  Glucagon was given im.  On arrival to er, pt alert, confused.  Pt denies any pain   pt has abrasions to right forearm.

## 2021-05-23 NOTE — ED Notes (Addendum)
Fsbs 122  ekg done  pt drank diet shasta cola

## 2021-05-23 NOTE — ED Notes (Signed)
Report  off to brandon rn

## 2021-05-23 NOTE — ED Notes (Signed)
Family with pt

## 2021-05-23 NOTE — ED Triage Notes (Signed)
Pt brought in via ems from [facility] with a low blood sugar of 46. Pt ate, drank and was given 1mg  glucagon at nursing home.  Fsbs 132 per ems.  On arrival pt alert, recent falls and was seen in this er.  Pt has bandage on right arm .

## 2021-05-23 NOTE — Discharge Instructions (Addendum)
Please discontinue patient's Xanax.  He should follow-up with his PCP to look for alternative for restlessness as Xanax is very dangerous in his age group and likely is contributing to recurrent falls.  Also please do not administer any long-acting insulin prior to patient leaving and follow-up with PCP to see if need to adjust the sliding scale.

## 2021-05-28 ENCOUNTER — Ambulatory Visit: Payer: Medicare Other

## 2021-05-28 ENCOUNTER — Other Ambulatory Visit: Payer: Medicare Other

## 2021-05-28 ENCOUNTER — Ambulatory Visit: Payer: Medicare Other | Admitting: Oncology

## 2021-05-31 ENCOUNTER — Other Ambulatory Visit: Payer: Self-pay

## 2021-05-31 DIAGNOSIS — E538 Deficiency of other specified B group vitamins: Secondary | ICD-10-CM

## 2021-05-31 DIAGNOSIS — D46Z Other myelodysplastic syndromes: Secondary | ICD-10-CM

## 2021-06-01 ENCOUNTER — Encounter: Payer: Self-pay | Admitting: Oncology

## 2021-06-01 ENCOUNTER — Inpatient Hospital Stay (HOSPITAL_BASED_OUTPATIENT_CLINIC_OR_DEPARTMENT_OTHER): Payer: Medicare Other | Admitting: Oncology

## 2021-06-01 ENCOUNTER — Inpatient Hospital Stay: Payer: Medicare Other | Attending: Oncology

## 2021-06-01 ENCOUNTER — Inpatient Hospital Stay: Payer: Medicare Other

## 2021-06-01 ENCOUNTER — Other Ambulatory Visit: Payer: Self-pay

## 2021-06-01 VITALS — BP 155/73 | HR 92 | Temp 100.9°F | Resp 18 | Wt 210.0 lb

## 2021-06-01 DIAGNOSIS — D46Z Other myelodysplastic syndromes: Secondary | ICD-10-CM | POA: Diagnosis not present

## 2021-06-01 DIAGNOSIS — Z79899 Other long term (current) drug therapy: Secondary | ICD-10-CM

## 2021-06-01 DIAGNOSIS — D469 Myelodysplastic syndrome, unspecified: Secondary | ICD-10-CM

## 2021-06-01 DIAGNOSIS — E538 Deficiency of other specified B group vitamins: Secondary | ICD-10-CM | POA: Diagnosis not present

## 2021-06-01 LAB — CBC WITH DIFFERENTIAL/PLATELET
Abs Immature Granulocytes: 0.01 10*3/uL (ref 0.00–0.07)
Basophils Absolute: 0 10*3/uL (ref 0.0–0.1)
Basophils Relative: 1 %
Eosinophils Absolute: 0.1 10*3/uL (ref 0.0–0.5)
Eosinophils Relative: 3 %
HCT: 26.2 % — ABNORMAL LOW (ref 39.0–52.0)
Hemoglobin: 8.2 g/dL — ABNORMAL LOW (ref 13.0–17.0)
Immature Granulocytes: 0 %
Lymphocytes Relative: 24 %
Lymphs Abs: 0.9 10*3/uL (ref 0.7–4.0)
MCH: 33.7 pg (ref 26.0–34.0)
MCHC: 31.3 g/dL (ref 30.0–36.0)
MCV: 107.8 fL — ABNORMAL HIGH (ref 80.0–100.0)
Monocytes Absolute: 0.2 10*3/uL (ref 0.1–1.0)
Monocytes Relative: 5 %
Neutro Abs: 2.5 10*3/uL (ref 1.7–7.7)
Neutrophils Relative %: 67 %
Platelets: 101 10*3/uL — ABNORMAL LOW (ref 150–400)
RBC: 2.43 MIL/uL — ABNORMAL LOW (ref 4.22–5.81)
RDW: 14.9 % (ref 11.5–15.5)
WBC: 3.7 10*3/uL — ABNORMAL LOW (ref 4.0–10.5)
nRBC: 0 % (ref 0.0–0.2)

## 2021-06-01 LAB — COMPREHENSIVE METABOLIC PANEL
ALT: 16 U/L (ref 0–44)
AST: 19 U/L (ref 15–41)
Albumin: 3.2 g/dL — ABNORMAL LOW (ref 3.5–5.0)
Alkaline Phosphatase: 87 U/L (ref 38–126)
Anion gap: 6 (ref 5–15)
BUN: 27 mg/dL — ABNORMAL HIGH (ref 8–23)
CO2: 24 mmol/L (ref 22–32)
Calcium: 8.4 mg/dL — ABNORMAL LOW (ref 8.9–10.3)
Chloride: 107 mmol/L (ref 98–111)
Creatinine, Ser: 1.66 mg/dL — ABNORMAL HIGH (ref 0.61–1.24)
GFR, Estimated: 40 mL/min — ABNORMAL LOW (ref >60)
Glucose, Bld: 211 mg/dL — ABNORMAL HIGH (ref 70–99)
Potassium: 4.3 mmol/L (ref 3.5–5.1)
Sodium: 137 mmol/L (ref 135–145)
Total Bilirubin: 0.7 mg/dL (ref 0.3–1.2)
Total Protein: 7.4 g/dL (ref 6.5–8.1)

## 2021-06-01 LAB — IRON AND TIBC
Iron: 51 ug/dL (ref 45–182)
Saturation Ratios: 18 % (ref 17.9–39.5)
TIBC: 288 ug/dL (ref 250–450)
UIBC: 237 ug/dL

## 2021-06-01 LAB — FOLATE: Folate: 16.4 ng/mL (ref >5.9)

## 2021-06-01 LAB — VITAMIN B12: Vitamin B-12: 398 pg/mL (ref 180–914)

## 2021-06-01 LAB — FERRITIN: Ferritin: 188 ng/mL (ref 24–336)

## 2021-06-01 MED ORDER — LUSPATERCEPT-AAMT 75 MG ~~LOC~~ SOLR
100.0000 mg | SUBCUTANEOUS | Status: DC
  Administered 2021-06-01: 100 mg via SUBCUTANEOUS
  Filled 2021-06-01: qty 1.5

## 2021-06-01 NOTE — Progress Notes (Signed)
Patient tolerated Luspatercept injection well today, no concerns voiced. Patient discharged with son, stable.

## 2021-06-01 NOTE — Progress Notes (Signed)
Pt is running a low-grade fever ut feeling fins. Per son there are a couple of changes he will like to discuss. Also, will like to try pt off of xanax because he seems to be "zoned out" most of the time.

## 2021-06-04 LAB — KAPPA/LAMBDA LIGHT CHAINS
Kappa free light chain: 75.9 mg/L — ABNORMAL HIGH (ref 3.3–19.4)
Kappa, lambda light chain ratio: 5.62 — ABNORMAL HIGH (ref 0.26–1.65)
Lambda free light chains: 13.5 mg/L (ref 5.7–26.3)

## 2021-06-05 LAB — MULTIPLE MYELOMA PANEL, SERUM
Albumin SerPl Elph-Mcnc: 3.2 g/dL (ref 2.9–4.4)
Albumin/Glob SerPl: 1 (ref 0.7–1.7)
Alpha 1: 0.3 g/dL (ref 0.0–0.4)
Alpha2 Glob SerPl Elph-Mcnc: 0.9 g/dL (ref 0.4–1.0)
B-Globulin SerPl Elph-Mcnc: 0.7 g/dL (ref 0.7–1.3)
Gamma Glob SerPl Elph-Mcnc: 1.6 g/dL (ref 0.4–1.8)
Globulin, Total: 3.4 g/dL (ref 2.2–3.9)
IgA: 22 mg/dL — ABNORMAL LOW (ref 61–437)
IgG (Immunoglobin G), Serum: 1967 mg/dL — ABNORMAL HIGH (ref 603–1613)
IgM (Immunoglobulin M), Srm: 21 mg/dL (ref 15–143)
M Protein SerPl Elph-Mcnc: 1.4 g/dL — ABNORMAL HIGH
Total Protein ELP: 6.6 g/dL (ref 6.0–8.5)

## 2021-06-08 ENCOUNTER — Encounter: Payer: Self-pay | Admitting: Hematology and Oncology

## 2021-06-08 NOTE — Progress Notes (Signed)
Hematology/Oncology Consult note [facility]  [phone] [phone]  Patient Care Team: Leonel Ramsay, MD as PCP - General (Infectious Diseases) Lequita Asal, MD (Inactive) as Referring Physician (Hematology and Oncology) Crissie Sickles, MD as Referring Physician (Hematology and Oncology) Crissie Sickles, MD as Referring Physician (Hematology and Oncology)   Name of the patient: Alex Smith  [id number]  10-03-1934   Date of visit: 06/08/21  Diagnosis- History of hairy cell leukemia 2.  Smoldering multiple myeloma under observation 3.  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 [name] and is transitioning his care to me.  He has following issues:   1.  Hairy cell leukemia that was initially diagnosed on bone marrow biopsy in June 2016.  Bone marrow biopsy at that time showed extensive marrow involvement with recurrent hairy cell leukemia approximately 80% of the cells in the core 10% monoclonal plasma cell infiltrate compatible with plasma cell neoplasm.  Peripheral smear revealed leukopenia with a white count of 1900 compatible with hairy cell leukemia. 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  FISH studies for MDS were negative.  Cytogenetics were normal (46, XY).  He has last received cladribine for this back in July 2016   2.  Smoldering multiple myeloma: Bone marrow biopsy in March 2017 revealed persistent plasma cell neoplasm with monoclonal plasma cells estimated at 20 to 30%.  No morphologic evidence of residual hairy cell leukemia.Marrow was normocellular for age with relative erythroid hyperplasia, relative myeloid hypoplasia, mild dyspoiesis, adequate megakaryocytes and no increased blasts.  There  was no significant increase in marrow reticulin fibers.  Iron was present.  FISH studies for MDS were negative.  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  Cytogenetics were normal (46, XY).  Last bone marrow biopsy was in October 2018 which showed variably cellular marrow 10 to 50% with kappa restricted plasmacytosis of 10 to 15%.  No evidence of leukemia lymphoma or high-grade dysplasia.  Normal cytogenetics.  He has also been followed by Dr. [name] at [facility].  He has received Revlimid and Decadron in the past in 2017 which was subsequently stopped.  PET scan in July 2017 showed no hypermetabolic adenopathy or focal skeletal lesions.   3.  Probable MDS: He has received a trial of Procrit in the past in 2017 but apparently did not respond to it.  He has been getting periodic blood transfusions to maintain his hemoglobin closer to 9 as he is symptomatic when his hemoglobin is less than 9.  He has been getting about 2-3 blood transfusions during the year.  He does have some mild leukopenia/neutropenia and is on acyclovir prophylaxis.  He gets weekly G-CSF when his ANC is less than 1.5.   4.  Also has history of B12 deficiency for which he is on oral B12.      Interval history-patient is here with his son today.  He was admitted to the hospital in September 2022For symptoms of generalized weakness and mechanical fall as well as fever.  He was treated for community-acquired pneumonia and acute metabolic encephalopathy.  He was subsequently discharged to a rehab.  He is making some slow progress at the rehab and hopes to go back home soon.  Reports fatigue and ongoing exertional shortness of breath.  Has an occasional low-grade fever.  ECOG PS- 3 Pain scale- 0   Review of systems- Review of Systems  Constitutional:  Positive for malaise/fatigue. Negative for chills, fever and weight loss.  HENT:  Negative for congestion, ear discharge and nosebleeds.    Eyes:  Negative for blurred vision.  Respiratory:  Positive for shortness of breath. Negative for cough, hemoptysis, sputum production and wheezing.   Cardiovascular:  Negative for chest pain, palpitations, orthopnea and claudication.  Gastrointestinal:  Negative for abdominal pain, blood in stool, constipation, diarrhea, heartburn, melena, nausea and vomiting.  Genitourinary:  Negative for dysuria, flank pain, frequency, hematuria and urgency.  Musculoskeletal:  Negative for back pain, joint pain and myalgias.  Skin:  Negative for rash.  Neurological:  Negative for dizziness, tingling, focal weakness, seizures, weakness and headaches.  Endo/Heme/Allergies:  Does not bruise/bleed easily.  Psychiatric/Behavioral:  Negative for depression and suicidal ideas. The patient does not have insomnia.      Allergies  Allergen Reactions   Rituximab Rash    Chest tightness Chest tightness   Blood-Group Specific Substance Other (See Comments)    Had a post transfusion reaction of red blood cells; NOW REQUIRES WASHED BLOOD CELLS Had a post transfusion reaction of red blood cells; NOW REQUIRES WASHED BLOOD CELLS   Primaxin [Imipenem] Other (See Comments)    Possible allergy Possible allergy   Voriconazole Other (See Comments)   Sulfa Antibiotics Itching and Rash   Sulfacetamide Sodium Itching and Rash     Past Medical History:  Diagnosis Date   Anemia    B12 deficiency    Basal cell carcinoma 03/30/2018   left ant nasal ala   Basal cell carcinoma of face 01/19/2013   right distal dorsum nose   BPH (benign prostatic hypertrophy)    followed by urology, discharged (Dr. Bernardo Heater)   CAP (community acquired pneumonia) 02/15/2015   CKD stage 3 due to type 2 diabetes mellitus ([place]) 11/14/2017   Colon polyps    Diverticulosis    Dysplastic nevus 10/27/2006   right med calf   GERD (gastroesophageal reflux disease)    Hairy cell leukemia ([place]) 2006   recurrent, seizure on rituxan, now on  cladribine [name])   History of pneumonia 2000's   "once" (07/07/2012)   History of shingles    HLD (hyperlipidemia)    Hypertension    Pneumonia    Shortness of breath dyspnea    Squamous cell carcinoma in situ 07/05/2020   Left lat. pretibial. 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 EDC 09/07/2020   Squamous cell carcinoma of skin 04/03/2020   Left cheek. WD SCC   Systolic murmur 11/91/4782   Type 2 diabetes, controlled, with retinopathy ([place])      Past Surgical History:  Procedure Laterality Date   25 GAUGE PARS PLANA VITRECTOMY WITH 20 GAUGE MVR PORT FOR MACULAR HOLE  07/07/2012   Procedure: 25 GAUGE PARS PLANA VITRECTOMY WITH 20 GAUGE MVR PORT FOR MACULAR HOLE;  Surgeon: Hayden Pedro, MD;  Location: [place];  Service: Ophthalmology;  Laterality: Left;   BONE MARROW BIOPSY  2016   CARDIOVASCULAR STRESS TEST  2013   treadmill - no evidence ischemia, EF 61%   CATARACT EXTRACTION W/ INTRAOCULAR LENS  IMPLANT, BILATERAL  ~ 2010   COLONOSCOPY  2014   [place] WNL no rpt needed, h/o polyps   EYE SURGERY Left 06/2012  laser surgery   GAS INSERTION  07/07/2012   Procedure: INSERTION OF GAS;  Surgeon: Hayden Pedro, MD;  Location: [place];  Service: Ophthalmology;  Laterality: Left;  C3F8   PERIPHERAL VASCULAR CATHETERIZATION N/A 02/23/2015   Procedure: Glori Luis Cath Insertion;  Surgeon: Algernon Huxley, MD;  Location: [place] [facility];  Service: Cardiovascular;  Laterality: N/A;   PORTA CATH REMOVAL N/A 02/16/2020   Procedure: PORTA CATH REMOVAL;  Surgeon: Algernon Huxley, MD;  Location: [place] [facility];  Service: Cardiovascular;  Laterality: N/A;   SERUM PATCH  07/07/2012   Procedure: SERUM PATCH;  Surgeon: Hayden Pedro, MD;  Location: [place];  Service: Ophthalmology;  Laterality: Left;   SKIN CANCER EXCISION     "all over my face" (07/07/2012)    Social History   Socioeconomic History   Marital status: Married    Spouse name:  Not on file   Number of children: Not on file   Years of education: Not on file   Highest education level: Not on file  Occupational History   Not on file  Tobacco Use   Smoking status: Former    Packs/day: 2.00    Years: 25.00    Pack years: 50.00    Types: Cigarettes    Quit date: 02/06/1969    Years since quitting: 52.3   Smokeless tobacco: Never   Tobacco comments:    07/07/2012 "stopped smoking ~ 40 yr ago; smoked 20-95yr  Vaping Use   Vaping Use: Never used  Substance and Sexual Activity   Alcohol use: Yes    Alcohol/week: 0.0 standard drinks    Comment: rare   Drug use: No   Sexual activity: Never  Other Topics Concern   Not on file  Social History Narrative   Lives at home alone. 1 cat   Occupation: was cArt gallery manager works part time for [employer]   Activity: likes to golf   Diet: moderate water, fruits/vegetables daily   Social Determinants of HRadio broadcast assistantStrain: Not on file  Food Insecurity: Not on file  Transportation Needs: Not on file  Physical Activity: Not on file  Stress: Not on file  Social Connections: Not on file  Intimate Partner Violence: Not on file    Family History  Problem Relation Age of Onset   Dementia Mother    Heart failure Father [phone]  Cancer Sister        breast   Diabetes Paternal Uncle    Diabetes Paternal Aunt    CAD Brother [phone]      MI   Stroke Neg Hx      Current Outpatient Medications:    amLODipine (NORVASC) 5 MG tablet, Take 1 tablet (5 mg total) by mouth daily., Disp: 30 tablet, Rfl: 2   aspirin EC 81 MG tablet, Take 81 mg by mouth daily., Disp: , Rfl:    doxazosin (CARDURA) 1 MG tablet, TAKE ONE TABLET BY MOUTH EVERY DAY, Disp: 30 tablet, Rfl: 5   famotidine (PEPCID) 40 MG tablet, Take 0.5 tablets (20 mg total) by mouth daily. NEEDS OFFICE VISIT, Disp: 15 tablet, Rfl: 0   glucose blood (ACCU-CHEK AVIVA PLUS) test strip, USE TO CHECK SUGAR DAILY E11.319, Disp: 200 each, Rfl: 3   HUMULIN N 100  UNIT/ML injection, INJECT 39 UNITS SUBCUTANEOUSLY TWICE DAILY BEFORE A MEAL (Patient taking differently: Inject 1 Units into the skin as directed. Pt uses this as sliding scale), Disp: 20 mL,  Rfl: 2   HUMULIN R 100 UNIT/ML injection, INJECT 10 UNITS INTO THE SKIN TWICE DAILY WITH MEALS, Disp: 10 mL, Rfl: 5   lisinopril (ZESTRIL) 20 MG tablet, TAKE ONE TABLET BY MOUTH TWICE DAILY, Disp: 180 tablet, Rfl: 0   Microlet Lancets MISC, USE AS DIRECTED, Disp: 200 each, Rfl: 3   omeprazole (PRILOSEC) 20 MG capsule, Take 1 capsule by mouth 1 day or 1 dose., Disp: , Rfl:    pravastatin (PRAVACHOL) 20 MG tablet, TAKE ONE TABLET EVERY EVENING, Disp: 30 tablet, Rfl: 0   QUEtiapine (SEROQUEL) 50 MG tablet, Take 1 tablet (50 mg total) by mouth at bedtime., Disp: 30 tablet, Rfl: 2   tamsulosin (FLOMAX) 0.4 MG CAPS capsule, TAKE 1 CAPSULE BY MOUTH EVERY DAY, Disp: 30 capsule, Rfl: 3   traZODone (DESYREL) 50 MG tablet, TAKE 1/2 TO 1 TABLET BY MOUTH AT BEDTIMEAS NEEDED FOR SLEEP, Disp: 30 tablet, Rfl: 0   ULTICARE INSULIN SYRINGE 31G X 5/16" 0.5 ML MISC, See admin instructions., Disp: , Rfl:    vitamin B-12 (CYANOCOBALAMIN) 1000 MCG tablet, Take 1 tablet (1,000 mcg total) by mouth daily., Disp: 90 tablet, Rfl: 3   vitamin E 400 UNIT capsule, Take 200 Units by mouth daily. , Disp: , Rfl:  No current facility-administered medications for this visit.  Facility-Administered Medications Ordered in Other Visits:    heparin lock flush 100 unit/mL, 500 Units, Intravenous, Once, Corcoran, Melissa C, MD   luspatercept-aamt (REBLOZYL) subcutaneous injection 100 mg, 100 mg, Subcutaneous, Q30 days, Sindy Guadeloupe, MD   luspatercept-aamt (REBLOZYL) subcutaneous injection 100 mg, 100 mg, Subcutaneous, Q30 days, Sindy Guadeloupe, MD, 100 mg at 11/18/19 1252   luspatercept-aamt (REBLOZYL) subcutaneous injection 100 mg, 100 mg, Subcutaneous, Q21 days, [name], Melissa C, MD, 100 mg at 05/05/20 1447   luspatercept-aamt (REBLOZYL)  subcutaneous injection 100 mg, 100 mg, Subcutaneous, Q21 days, [name], Melissa C, MD, 100 mg at 07/28/20 1447   sodium chloride 0.9 % injection 10 mL, 10 mL, Intravenous, PRN, [name], Melissa C, MD, 10 mL at 03/03/15 0903   sodium chloride 0.9 % injection 10 mL, 10 mL, Intracatheter, PRN, [name], Melissa C, MD, 10 mL at 03/10/15 1410   sodium chloride flush (NS) 0.9 % injection 10 mL, 10 mL, Intravenous, PRN, [name], Melissa C, MD, 10 mL at 07/23/18 1517  Physical exam:  Vitals:   06/01/21 1110  BP: (!) 155/73  Pulse: 92  Resp: 18  Temp: (!) 100.9 F (38.3 C)  SpO2: 100%  Weight: 210 lb (95.3 kg)   Physical Exam Constitutional:      Comments: Sitting in a wheelchair and appears fatigued  Cardiovascular:     Rate and Rhythm: Normal rate and regular rhythm.     Heart sounds: Normal heart sounds.  Pulmonary:     Effort: Pulmonary effort is normal.     Breath sounds: Normal breath sounds.  Abdominal:     General: Bowel sounds are normal.     Palpations: Abdomen is soft.  Musculoskeletal:     Cervical back: Normal range of motion.     Comments: Trace bilateral edema  Skin:    General: Skin is warm and dry.  Neurological:     Mental Status: He is alert and oriented to person, place, and time.     CMP Latest Ref Rng & Units 06/01/2021  Glucose 70 - 99 mg/dL 211(H)  BUN 8 - 23 mg/dL 27(H)  Creatinine 0.61 - 1.24 mg/dL 1.66(H)  Sodium 135 -  145 mmol/L 137  Potassium 3.5 - 5.1 mmol/L 4.3  Chloride 98 - 111 mmol/L 107  CO2 22 - 32 mmol/L 24  Calcium 8.9 - 10.3 mg/dL 8.4(L)  Total Protein 6.5 - 8.1 g/dL 7.4  Total Bilirubin 0.3 - 1.2 mg/dL 0.7  Alkaline Phos 38 - 126 U/L 87  AST 15 - 41 U/L 19  ALT 0 - 44 U/L 16   CBC Latest Ref Rng & Units 06/01/2021  WBC 4.0 - 10.5 K/uL 3.7(L)  Hemoglobin 13.0 - 17.0 g/dL 8.2(L)  Hematocrit 39.0 - 52.0 % 26.2(L)  Platelets 150 - 400 K/uL 101(L)    No images are attached to the encounter.  CT HEAD WO CONTRAST (5MM)  Result  Date: 05/23/2021 CLINICAL DATA:  Low blood sugar.  Drank glucagon.  Recent falls. EXAM: CT HEAD WITHOUT CONTRAST CT CERVICAL SPINE WITHOUT CONTRAST TECHNIQUE: Multidetector CT imaging of the head and cervical spine was performed following the standard protocol without intravenous contrast. Multiplanar CT image reconstructions of the cervical spine were also generated. COMPARISON:  CT head 05/01/2021 FINDINGS: CT HEAD FINDINGS BRAIN: BRAIN Cerebral ventricle sizes are concordant with the degree of cerebral volume loss. Patchy and confluent areas of decreased attenuation are noted throughout the deep and periventricular white matter of the cerebral hemispheres bilaterally, compatible with chronic microvascular ischemic disease. No evidence of large-territorial acute infarction. No parenchymal hemorrhage. No mass lesion. No extra-axial collection. No mass effect or midline shift. No hydrocephalus. Basilar cisterns are patent. Vascular: No hyperdense vessel. Atherosclerotic calcifications are present within the cavernous internal carotid arteries. Skull: No acute fracture or focal lesion. Sinuses/Orbits: Paranasal sinuses and mastoid air cells are clear. Bilateral lens replacement. Otherwise the orbits are unremarkable. Other: None. CT CERVICAL SPINE FINDINGS Alignment: Normal. Skull base and vertebrae: Multilevel degenerative changes of the spine. Associated severe osseous neural foraminal stenosis at the left C3-C4 level. No severe osseous central canal stenosis. No acute fracture. No aggressive appearing focal osseous lesion or focal pathologic process. Soft tissues and spinal canal: No prevertebral fluid or swelling. No visible canal hematoma. Upper chest: Unremarkable. Other: None. IMPRESSION: 1. No acute intracranial abnormality. 2. No acute displaced fracture or traumatic listhesis of the cervical spine. 3. Severe left C3-C4 osseous neural foraminal stenosis in the setting of degenerative changes. Electronically  Signed   By: Iven Finn M.D.   On: 05/23/2021 17:02   CT Cervical Spine Wo Contrast  Result Date: 05/23/2021 CLINICAL DATA:  Low blood sugar.  Drank glucagon.  Recent falls. EXAM: CT HEAD WITHOUT CONTRAST CT CERVICAL SPINE WITHOUT CONTRAST TECHNIQUE: Multidetector CT imaging of the head and cervical spine was performed following the standard protocol without intravenous contrast. Multiplanar CT image reconstructions of the cervical spine were also generated. COMPARISON:  CT head 05/01/2021 FINDINGS: CT HEAD FINDINGS BRAIN: BRAIN Cerebral ventricle sizes are concordant with the degree of cerebral volume loss. Patchy and confluent areas of decreased attenuation are noted throughout the deep and periventricular white matter of the cerebral hemispheres bilaterally, compatible with chronic microvascular ischemic disease. No evidence of large-territorial acute infarction. No parenchymal hemorrhage. No mass lesion. No extra-axial collection. No mass effect or midline shift. No hydrocephalus. Basilar cisterns are patent. Vascular: No hyperdense vessel. Atherosclerotic calcifications are present within the cavernous internal carotid arteries. Skull: No acute fracture or focal lesion. Sinuses/Orbits: Paranasal sinuses and mastoid air cells are clear. Bilateral lens replacement. Otherwise the orbits are unremarkable. Other: None. CT CERVICAL SPINE FINDINGS Alignment: Normal. Skull base and vertebrae: Multilevel degenerative  changes of the spine. Associated severe osseous neural foraminal stenosis at the left C3-C4 level. No severe osseous central canal stenosis. No acute fracture. No aggressive appearing focal osseous lesion or focal pathologic process. Soft tissues and spinal canal: No prevertebral fluid or swelling. No visible canal hematoma. Upper chest: Unremarkable. Other: None. IMPRESSION: 1. No acute intracranial abnormality. 2. No acute displaced fracture or traumatic listhesis of the cervical spine. 3. Severe  left C3-C4 osseous neural foraminal stenosis in the setting of degenerative changes. Electronically Signed   By: Iven Finn M.D.   On: 05/23/2021 17:02   DG Chest Portable 1 View  Result Date: 05/23/2021 CLINICAL DATA:  Recurrent fall, weakness EXAM: PORTABLE CHEST 1 VIEW COMPARISON:  05/01/2021, 01/31/2015 FINDINGS: Cardiomegaly with aortic atherosclerosis. Right lung grossly clear. Possible patchy nodular opacity in the left upper lung. Scarring and or atelectasis left base. IMPRESSION: 1. Cardiomegaly with probable scarring at the left base. 2. Patchy nodular opacities in the left upper lung possibly due to small foci of infection or inflammation. Short interval two-view chest radiographic follow-up recommended Electronically Signed   By: Donavan Foil M.D.   On: 05/23/2021 17:27   DG HIP UNILAT WITH PELVIS 2-3 VIEWS LEFT  Result Date: 05/10/2021 CLINICAL DATA:  Left hip pain, no known injury EXAM: DG HIP (WITH OR WITHOUT PELVIS) 2-3V LEFT COMPARISON:  None. FINDINGS: There is no evidence of hip fracture or dislocation. Mild superior joint space loss without significant osteophytosis. Nonobstructive pattern of overlying bowel gas. Phleboliths in the pelvis. IMPRESSION: No fracture or dislocation of the left hip. Mild superior joint space loss without significant osteophytosis. Electronically Signed   By: Eddie Candle M.D.   On: 05/10/2021 11:24     Assessment and plan- Patient is a 86 y.o. male who is here for follow-up of following issues:  With low-grade MDS on Luspatercept:After patient started taking Luspatercept his hemoglobin had increased from 8s to the tens.  However when patient was admitted to the hospital for pneumonia his hemoglobin drifted down to 8.8 100s 8.2 today.  It is unclear if this is a transient effect from bone marrow suppression from his recent hospitalization versus increasing the frequency of Luspatercept to every5 weeks from every 4 weeks.  We did to iron studies today  which shows a normal ferritin of 188 and normal iron studies.  Folate and B12 were also normal.  I will continue Luspatercept every 5 weeks and see him back in 10 weeks.  If his hemoglobin does not increase back his baseline I will consider giving him Luspatercept again every 4 weeks.  He will receive Luspatercept today  2.  Smoldering multiple myeloma:I do not think that his worsening anemia is due to his smoldering multiple myeloma given that his M protein is remained stable around 1.4.  Free light chain ratio has remained stable around 5.  Renal functions are also relatively stable continue to monitor     Visit Diagnosis 1. Myelodysplasia present in bone marrow ([place])   2. High risk medication use      Dr. Randa Evens, MD, MPH [facility] [phone] 06/08/2021 7:27 AM

## 2021-06-25 ENCOUNTER — Other Ambulatory Visit: Payer: Self-pay

## 2021-06-25 ENCOUNTER — Emergency Department
Admission: EM | Admit: 2021-06-25 | Discharge: 2021-06-25 | Disposition: A | Discharge status: Home / Self Care | Payer: Medicare Other | Attending: Emergency Medicine | Admitting: Emergency Medicine

## 2021-06-25 ENCOUNTER — Emergency Department: Payer: Medicare Other

## 2021-06-25 DIAGNOSIS — Z794 Long term (current) use of insulin: Secondary | ICD-10-CM | POA: Insufficient documentation

## 2021-06-25 DIAGNOSIS — Z87891 Personal history of nicotine dependence: Secondary | ICD-10-CM | POA: Diagnosis not present

## 2021-06-25 DIAGNOSIS — Z7982 Long term (current) use of aspirin: Secondary | ICD-10-CM | POA: Insufficient documentation

## 2021-06-25 DIAGNOSIS — E1122 Type 2 diabetes mellitus with diabetic chronic kidney disease: Secondary | ICD-10-CM | POA: Insufficient documentation

## 2021-06-25 DIAGNOSIS — N3000 Acute cystitis without hematuria: Secondary | ICD-10-CM | POA: Diagnosis not present

## 2021-06-25 DIAGNOSIS — I129 Hypertensive chronic kidney disease with stage 1 through stage 4 chronic kidney disease, or unspecified chronic kidney disease: Secondary | ICD-10-CM | POA: Diagnosis not present

## 2021-06-25 DIAGNOSIS — N183 Chronic kidney disease, stage 3 unspecified: Secondary | ICD-10-CM | POA: Insufficient documentation

## 2021-06-25 DIAGNOSIS — Z20822 Contact with and (suspected) exposure to covid-19: Secondary | ICD-10-CM | POA: Insufficient documentation

## 2021-06-25 DIAGNOSIS — Z85828 Personal history of other malignant neoplasm of skin: Secondary | ICD-10-CM | POA: Diagnosis not present

## 2021-06-25 DIAGNOSIS — E11649 Type 2 diabetes mellitus with hypoglycemia without coma: Secondary | ICD-10-CM | POA: Diagnosis not present

## 2021-06-25 DIAGNOSIS — Z79899 Other long term (current) drug therapy: Secondary | ICD-10-CM | POA: Insufficient documentation

## 2021-06-25 DIAGNOSIS — R4182 Altered mental status, unspecified: Secondary | ICD-10-CM | POA: Diagnosis present

## 2021-06-25 LAB — RESP PANEL BY RT-PCR (FLU A&B, COVID) ARPGX2
Influenza A by PCR: NEGATIVE
Influenza B by PCR: NEGATIVE
SARS Coronavirus 2 by RT PCR: NEGATIVE

## 2021-06-25 LAB — CBC WITH DIFFERENTIAL/PLATELET
Abs Immature Granulocytes: 0.01 10*3/uL (ref 0.00–0.07)
Basophils Absolute: 0 10*3/uL (ref 0.0–0.1)
Basophils Relative: 0 %
Eosinophils Absolute: 0 10*3/uL (ref 0.0–0.5)
Eosinophils Relative: 1 %
HCT: 25.3 % — ABNORMAL LOW (ref 39.0–52.0)
Hemoglobin: 8 g/dL — ABNORMAL LOW (ref 13.0–17.0)
Immature Granulocytes: 0 %
Lymphocytes Relative: 10 %
Lymphs Abs: 0.3 10*3/uL — ABNORMAL LOW (ref 0.7–4.0)
MCH: 34.6 pg — ABNORMAL HIGH (ref 26.0–34.0)
MCHC: 31.6 g/dL (ref 30.0–36.0)
MCV: 109.5 fL — ABNORMAL HIGH (ref 80.0–100.0)
Monocytes Absolute: 0.2 10*3/uL (ref 0.1–1.0)
Monocytes Relative: 6 %
Neutro Abs: 2.1 10*3/uL (ref 1.7–7.7)
Neutrophils Relative %: 83 %
Platelets: 100 10*3/uL — ABNORMAL LOW (ref 150–400)
RBC: 2.31 MIL/uL — ABNORMAL LOW (ref 4.22–5.81)
RDW: 16.9 % — ABNORMAL HIGH (ref 11.5–15.5)
Smear Review: NORMAL
WBC: 2.5 10*3/uL — ABNORMAL LOW (ref 4.0–10.5)
nRBC: 0 % (ref 0.0–0.2)

## 2021-06-25 LAB — URINALYSIS, COMPLETE (UACMP) WITH MICROSCOPIC
Bacteria, UA: NONE SEEN
Bilirubin Urine: NEGATIVE
Glucose, UA: 50 mg/dL — AB
Hgb urine dipstick: NEGATIVE
Ketones, ur: NEGATIVE mg/dL
Nitrite: NEGATIVE
Protein, ur: NEGATIVE mg/dL
Specific Gravity, Urine: 1.014 (ref 1.005–1.030)
Squamous Epithelial / HPF: NONE SEEN (ref 0–5)
pH: 5 (ref 5.0–8.0)

## 2021-06-25 LAB — CBG MONITORING, ED
Glucose-Capillary: 102 mg/dL — ABNORMAL HIGH (ref 70–99)
Glucose-Capillary: 103 mg/dL — ABNORMAL HIGH (ref 70–99)
Glucose-Capillary: 60 mg/dL — ABNORMAL LOW (ref 70–99)
Glucose-Capillary: 91 mg/dL (ref 70–99)
Glucose-Capillary: 97 mg/dL (ref 70–99)
Glucose-Capillary: 97 mg/dL (ref 70–99)

## 2021-06-25 LAB — COMPREHENSIVE METABOLIC PANEL
ALT: 10 U/L (ref 0–44)
AST: 13 U/L — ABNORMAL LOW (ref 15–41)
Albumin: 3.2 g/dL — ABNORMAL LOW (ref 3.5–5.0)
Alkaline Phosphatase: 75 U/L (ref 38–126)
Anion gap: 6 (ref 5–15)
BUN: 17 mg/dL (ref 8–23)
CO2: 23 mmol/L (ref 22–32)
Calcium: 8.2 mg/dL — ABNORMAL LOW (ref 8.9–10.3)
Chloride: 110 mmol/L (ref 98–111)
Creatinine, Ser: 1.27 mg/dL — ABNORMAL HIGH (ref 0.61–1.24)
GFR, Estimated: 56 mL/min — ABNORMAL LOW (ref >60)
Glucose, Bld: 66 mg/dL — ABNORMAL LOW (ref 70–99)
Potassium: 3.4 mmol/L — ABNORMAL LOW (ref 3.5–5.1)
Sodium: 139 mmol/L (ref 135–145)
Total Bilirubin: 0.9 mg/dL (ref 0.3–1.2)
Total Protein: 6.7 g/dL (ref 6.5–8.1)

## 2021-06-25 LAB — TROPONIN I (HIGH SENSITIVITY)
Troponin I (High Sensitivity): 7 ng/L (ref <18)
Troponin I (High Sensitivity): 7 ng/L (ref <18)

## 2021-06-25 IMAGING — DX DG CHEST 1V PORT
1 series · 1 of 1 positions shown · non-contrast
Comparison: [DATE]

CLINICAL DATA: Altered mental status

EXAM:
PORTABLE CHEST 1 VIEW

[chest ap]
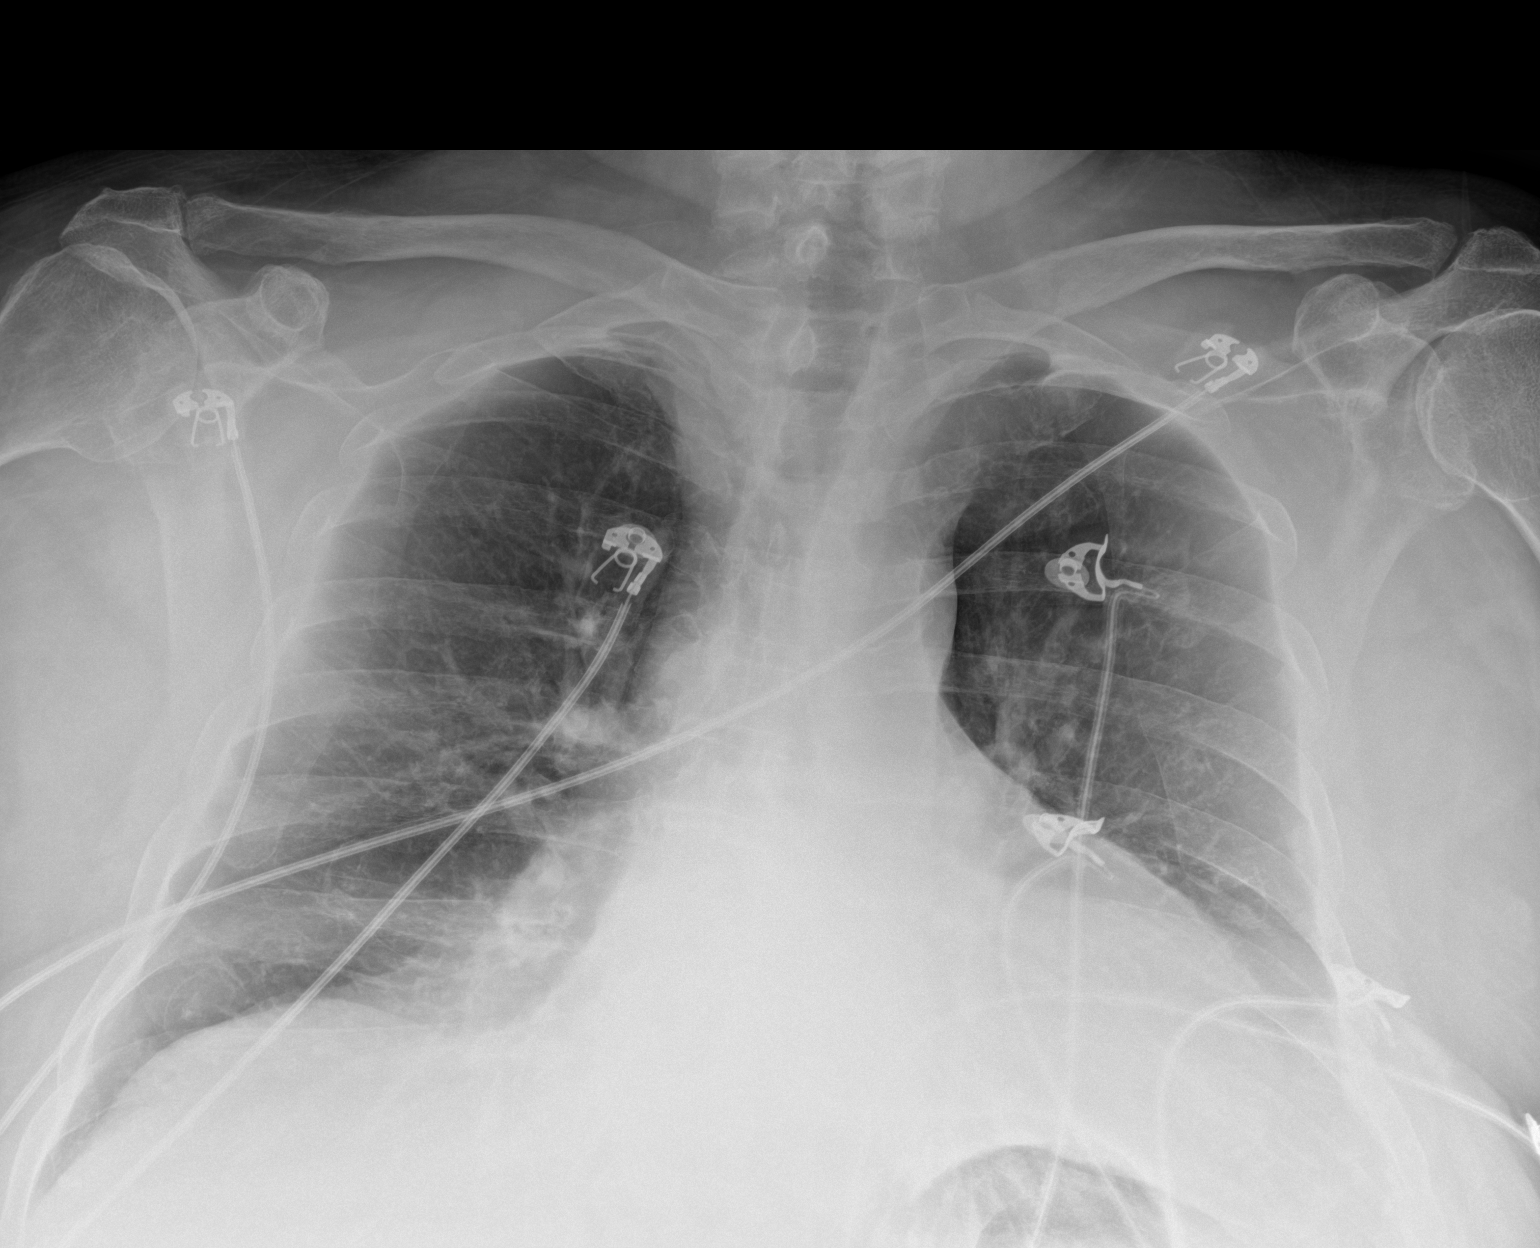

[1 of 1 positions shown; findings below may reference images not displayed]

FINDINGS: Cardiac shadow is enlarged but stable. The lungs are well aerated
bilaterally. No focal infiltrate or effusion is seen. Previously
seen opacities in the left upper lobe are no longer identified. No
new focal abnormality is seen.
IMPRESSION: No acute abnormality noted.

## 2021-06-25 MED ORDER — DEXTROSE 50 % IV SOLN
INTRAVENOUS | Status: AC
  Filled 2021-06-25: qty 50

## 2021-06-25 MED ORDER — SODIUM CHLORIDE 0.9 % IV SOLN
1.0000 g | Freq: Once | INTRAVENOUS | Status: AC
  Administered 2021-06-25: 1 g via INTRAVENOUS
  Filled 2021-06-25: qty 10

## 2021-06-25 MED ORDER — DEXTROSE 50 % IV SOLN
1.0000 | Freq: Once | INTRAVENOUS | Status: AC
  Administered 2021-06-25: 50 mL via INTRAVENOUS

## 2021-06-25 MED ORDER — CEPHALEXIN 500 MG PO CAPS: 500.0000 mg | ORAL_CAPSULE | Freq: Three times a day (TID) | ORAL | 0 refills | Status: AC

## 2021-06-25 NOTE — ED Notes (Signed)
EDP at bedside  

## 2021-06-25 NOTE — ED Notes (Signed)
Called [facility] for transport to [facility]

## 2021-06-25 NOTE — ED Provider Notes (Signed)
[facility] Emergency Department Provider Note  ____________________________________________  Time seen: Approximately 2:48 AM  I have reviewed the triage vital signs and the nursing notes.   HISTORY  Chief Complaint Hypoglycemia   HPI Alex Smith is a 86 y.o. male with a history of anemia, chronic kidney disease, pneumonia, shingles, hyperlipidemia, hypertension, diabetes who presents for evaluation of altered mental status.  EMS was called to the [facility] where patient was found to be altered.  Upon arrival patient's blood glucose was 22.  He was given 12.5 g of glucose via PIV and transported to the hospital.  Upon arrival to the emergency room patient is alert and oriented x4, denies any chest pain, cough, shortness of breath, headache, sore throat, fever, chills, abdominal pain, vomiting, diarrhea.  Patient is not sure why his sugar was low.  Patient reports eating and drinking normally today.  Review of MAR they came with patient shows the patient is both on long and short acting insulin with no changes in his regimen.   Past Medical History:  Diagnosis Date   Anemia    B12 deficiency    Basal cell carcinoma 03/30/2018   left ant nasal ala   Basal cell carcinoma of face 01/19/2013   right distal dorsum nose   BPH (benign prostatic hypertrophy)    followed by urology, discharged (Dr. 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  pretibial. 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 EDC 09/07/2020   Squamous cell carcinoma of skin 04/03/2020   Left cheek. 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 2 diabetes mellitus ([place]) 11/14/2017   Goals of care, counseling/discussion 05/27/2017   Pancytopenia due to antineoplastic chemotherapy ([place]) 11/17/2016   Chemotherapy-induced neutropenia ([place]) 09/22/2016   Urinary incontinence 00/76/2263   Systolic murmur 33/54/5625   Myelodysplastic syndrome, low grade ([place]) 02/05/2016   Multiple myeloma not having achieved remission ([place]) 02/05/2016   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   Myelodysplasia present in bone marrow ([place]) 11/03/2015   Advanced care planning/counseling discussion 05/22/2015   Insomnia 03/03/2015   Monoclonal gammopathy 02/03/2015   Macrocytic anemia 02/03/2015   Severe obesity (BMI 35.0-39.9) with comorbidity ([place]) 10/12/2014   History of nonmelanoma skin cancer 10/10/2014   Medicare annual wellness visit, subsequent 04/12/2014   Health maintenance examination 04/12/2014   Hairy cell leukemia, in remission ([place])    B12 deficiency    BPH with obstruction/lower urinary tract symptoms    Essential hypertension    GERD (gastroesophageal reflux disease)  Dyslipidemia    Type 2 diabetes, controlled, with retinopathy ([place])    Diverticulosis    Macular hole 07/07/2012    Past Surgical History:  Procedure Laterality Date   25 GAUGE PARS PLANA VITRECTOMY WITH 20 GAUGE MVR PORT FOR MACULAR HOLE  07/07/2012   Procedure: 25 GAUGE PARS PLANA VITRECTOMY WITH 20 GAUGE MVR PORT FOR MACULAR HOLE;  Surgeon: Hayden Pedro, MD;  Location: [facility];  Service:  Ophthalmology;  Laterality: Left;   BONE MARROW BIOPSY  2016   CARDIOVASCULAR STRESS TEST  2013   treadmill - no evidence ischemia, EF 61%   CATARACT EXTRACTION W/ INTRAOCULAR LENS  IMPLANT, BILATERAL  ~ 2010   COLONOSCOPY  2014   [place] WNL no rpt needed, h/o polyps   EYE SURGERY Left 06/2012   laser surgery   GAS INSERTION  07/07/2012   Procedure: INSERTION OF GAS;  Surgeon: Hayden Pedro, MD;  Location: [facility];  Service: Ophthalmology;  Laterality: Left;  C3F8   PERIPHERAL VASCULAR CATHETERIZATION N/A 02/23/2015   Procedure: Glori Luis Cath Insertion;  Surgeon: Algernon Huxley, MD;  Location: [facility];  Service: Cardiovascular;  Laterality: N/A;   PORTA CATH REMOVAL N/A 02/16/2020   Procedure: PORTA CATH REMOVAL;  Surgeon: Algernon Huxley, MD;  Location: [facility];  Service: Cardiovascular;  Laterality: N/A;   SERUM PATCH  07/07/2012   Procedure: SERUM PATCH;  Surgeon: Hayden Pedro, MD;  Location: [facility];  Service: Ophthalmology;  Laterality: Left;   SKIN CANCER EXCISION     "all over my face" (07/07/2012)    Prior to Admission medications   Medication Sig Start Date End Date Taking? Authorizing Provider  amLODipine (NORVASC) 5 MG tablet Take 1 tablet (5 mg total) by mouth daily. 05/12/21  Yes Dessa Phi, DO  aspirin EC 81 MG tablet Take 81 mg by mouth daily.   Yes [provider]  cephALEXin (KEFLEX) 500 MG capsule Take 1 capsule (500 mg total) by mouth 3 (three) times daily for 7 days. 06/25/21 07/02/21 Yes Alfred Levins, [place], MD  doxazosin (CARDURA) 1 MG tablet TAKE ONE TABLET BY MOUTH EVERY DAY 08/31/19  Yes Ria Bush, MD  famotidine (PEPCID) 40 MG tablet Take 0.5 tablets (20 mg total) by mouth daily. NEEDS OFFICE VISIT 07/16/20  Yes Ria Bush, MD  glucose blood (ACCU-CHEK AVIVA PLUS) test strip USE TO CHECK SUGAR DAILY E11.319 08/28/18  Yes Ria Bush, MD  HUMULIN N 100 UNIT/ML injection INJECT 39 UNITS SUBCUTANEOUSLY TWICE DAILY BEFORE  A MEAL Patient taking differently: Inject 35 Units into the skin 2 (two) times daily before a meal. Pt uses this as sliding scale 06/23/20  Yes Ria Bush, MD  HUMULIN R 100 UNIT/ML injection INJECT 10 UNITS INTO THE SKIN TWICE DAILY WITH MEALS Patient taking differently: Inject 5 Units into the skin 2 (two) times daily before a meal. 11/09/19  Yes Ria Bush, MD  lisinopril (ZESTRIL) 20 MG tablet TAKE ONE TABLET BY MOUTH TWICE DAILY 03/01/20  Yes Ria Bush, MD  Microlet Lancets MISC USE AS DIRECTED 10/28/19  Yes Ria Bush, MD  pravastatin (PRAVACHOL) 20 MG tablet TAKE ONE TABLET EVERY EVENING 03/16/20  Yes Ria Bush, MD  tamsulosin (FLOMAX) 0.4 MG CAPS capsule TAKE 1 CAPSULE BY MOUTH EVERY DAY 12/11/17  Yes Ria Bush, MD  Flossie Buffy INSULIN SYRINGE 31G X 5/16" 0.5 ML MISC See admin instructions. 01/24/20  Yes [provider]  vitamin B-12 (CYANOCOBALAMIN) 1000 MCG tablet Take 1  tablet (1,000 mcg total) by mouth daily. 06/01/19  Yes Ria Bush, MD  vitamin E 400 UNIT capsule Take 200 Units by mouth daily.    Yes [provider]  omeprazole (PRILOSEC) 20 MG capsule Take 1 capsule by mouth 1 day or 1 dose. Patient not taking: Reported on 06/25/2021    [provider]  QUEtiapine (SEROQUEL) 50 MG tablet Take 1 tablet (50 mg total) by mouth at bedtime. Patient not taking: Reported on 06/25/2021 05/11/21   Dessa Phi, DO  traZODone (DESYREL) 50 MG tablet TAKE 1/2 TO 1 TABLET BY MOUTH AT [facility] NEEDED FOR SLEEP Patient not taking: Reported on 06/25/2021 01/23/17   Ria Bush, MD    Allergies Rituximab, Blood-group specific substance, Primaxin [imipenem], Voriconazole, Sulfa antibiotics, and Sulfacetamide sodium  Family History  Problem Relation Age of Onset   Dementia Mother    Heart failure Father 64   Cancer Sister        breast   Diabetes Paternal Uncle    Diabetes Paternal Aunt    CAD Brother 83       MI    Stroke Neg Hx     Social History Social History   Tobacco Use   Smoking status: Former    Packs/day: 2.00    Years: 25.00    Pack years: 50.00    Types: Cigarettes    Quit date: 02/06/1969    Years since quitting: 52.4   Smokeless tobacco: Never   Tobacco comments:    07/07/2012 "stopped smoking ~ 40 yr ago; smoked 20-21yr  Vaping Use   Vaping Use: Never used  Substance Use Topics   Alcohol use: Yes    Alcohol/week: 0.0 standard drinks    Comment: rare   Drug use: No    Review of Systems  Constitutional: Negative for fever. + AMS Eyes: Negative for visual changes. ENT: Negative for sore throat. Neck: No neck pain  Cardiovascular: Negative for chest pain. Respiratory: Negative for shortness of breath. Gastrointestinal: Negative for abdominal pain, vomiting or diarrhea. Genitourinary: Negative for dysuria. Musculoskeletal: Negative for back pain. Skin: Negative for rash. Neurological: Negative for headaches, weakness or numbness. Psych: No SI or HI  ____________________________________________   PHYSICAL EXAM:  VITAL SIGNS: Vitals:   06/25/21 0530 06/25/21 0600  BP: (!) 157/89 (!) 166/79  Pulse: 84 83  Resp: (!) 21 (!) 22  Temp:    SpO2: 98% 98%     Constitutional: Alert and oriented x4 . Well appearing and in no apparent distress. HEENT:      Head: Normocephalic and atraumatic.         Eyes: Conjunctivae are normal. Sclera is non-icteric.       Mouth/Throat: Mucous membranes are moist.       Neck: Supple with no signs of meningismus. Cardiovascular: Regular rate and rhythm. No murmurs, gallops, or rubs. 2+ symmetrical distal pulses are present in all extremities. No JVD. Respiratory: Normal respiratory effort. Lungs are clear to auscultation bilaterally.  Gastrointestinal: Soft, non tender, and non distended with positive bowel sounds. No rebound or guarding. Genitourinary: No CVA tenderness. Musculoskeletal:  No edema, cyanosis, or erythema of  extremities. Neurologic: Normal speech and language. Face is symmetric. Moving all extremities. No gross focal neurologic deficits are appreciated. Skin: Skin is warm, dry and intact. No rash noted. Psychiatric: Mood and affect are normal. Speech and behavior are normal.  ____________________________________________   LABS (all labs ordered are listed, but only abnormal results are displayed)  Labs Reviewed  CBC WITH DIFFERENTIAL/PLATELET - Abnormal; Notable for the following components:      Result Value   WBC 2.5 (*)    RBC 2.31 (*)    Hemoglobin 8.0 (*)    HCT 25.3 (*)    MCV 109.5 (*)    MCH 34.6 (*)    RDW 16.9 (*)    Platelets 100 (*)    Lymphs Abs 0.3 (*)    All other components within normal limits  COMPREHENSIVE METABOLIC PANEL - Abnormal; Notable for the following components:   Potassium 3.4 (*)    Glucose, Bld 66 (*)    Creatinine, Ser 1.27 (*)    Calcium 8.2 (*)    Albumin 3.2 (*)    AST 13 (*)    GFR, Estimated 56 (*)    All other components within normal limits  URINALYSIS, COMPLETE (UACMP) WITH MICROSCOPIC - Abnormal; Notable for the following components:   Color, Urine YELLOW (*)    APPearance CLEAR (*)    Glucose, UA 50 (*)    Leukocytes,Ua MODERATE (*)    All other components within normal limits  CBG MONITORING, ED - Abnormal; Notable for the following components:   Glucose-Capillary 60 (*)    All other components within normal limits  CBG MONITORING, ED - Abnormal; Notable for the following components:   Glucose-Capillary 103 (*)    All other components within normal limits  CBG MONITORING, ED - Abnormal; Notable for the following components:   Glucose-Capillary 102 (*)    All other components within normal limits  RESP PANEL BY RT-PCR (FLU A&B, COVID) ARPGX2  URINE CULTURE  CBG MONITORING, ED  CBG MONITORING, ED  CBG MONITORING, ED  CBG MONITORING, ED  TROPONIN I (HIGH SENSITIVITY)  TROPONIN I (HIGH SENSITIVITY)    ____________________________________________  EKG  ED ECG REPORT I, Rudene Re, the attending physician, personally viewed and interpreted this ECG.  Sinus rhythm with a rate of 73, normal intervals, borderline right axis deviation, diffuse T wave flattening with no ST elevations or depressions.  Unchanged from prior. ____________________________________________  RADIOLOGY  I have personally reviewed the images performed during this visit and I agree with the Radiologist's read.   Interpretation by Radiologist:  DG Chest Portable 1 View  Result Date: 06/25/2021 CLINICAL DATA:  Altered mental status EXAM: PORTABLE CHEST 1 VIEW COMPARISON:  05/23/2021 FINDINGS: Cardiac shadow is enlarged but stable. The lungs are well aerated bilaterally. No focal infiltrate or effusion is seen. Previously seen opacities in the left upper lobe are no longer identified. No new focal abnormality is seen. IMPRESSION: No acute abnormality noted. Electronically Signed   By: Inez Catalina M.D.   On: 06/25/2021 01:18     ____________________________________________   PROCEDURES  Procedure(s) performed:yes .1-3 Lead EKG Interpretation Performed by: Rudene Re, MD Authorized by: Rudene Re, MD     Interpretation: non-specific     ECG rate assessment: normal     Rhythm: sinus rhythm     Ectopy: none     Conduction: abnormal     Critical Care performed:  None ____________________________________________   INITIAL IMPRESSION / ASSESSMENT AND PLAN / ED COURSE  86 y.o. male with a history of anemia, chronic kidney disease, pneumonia, shingles, hyperlipidemia, hypertension, diabetes who presents for evaluation of altered mental status.  Patient found altered in his nursing home from hypoglycemia with a sugar of 22.  Patient does not administer his medications.  Looking at the [facility] it seems like patient has received his regular  dose of long-acting and short acting insulin at 8 PM.   Patient does report that he has had normal diet yesterday.  Patient was given 12.5 g of IV glucose per EMS.  Upon arrival to the ED his sugar is 60.  Patient was given an amp of D50.  Blood glucoses were monitored hourly for 5 hours with no further episodes of hypoglycemia.  No signs of sepsis, electrolyte derangements, cardiac ischemia, dehydration, COVID or flu, pneumonia he does have a mild UTI which is most likely the cause of his hypoglycemia.  He was given Rocephin and will be discharged on Keflex.  Did review prior urine cultures showing that bacteria is usually sensitive to cephalosporins.  There is no signs of sepsis.  Patient has been eating.  Has had normal blood glucose now for several hours and overall.  Will discharge back to his nursing home.     _____________________________________________ Please note:  Patient was evaluated in Emergency Department today for the symptoms described in the history of present illness. Patient was evaluated in the context of the global COVID-19 pandemic, which necessitated consideration that the patient might be at risk for infection with the SARS-CoV-2 virus that causes COVID-19. Institutional protocols and algorithms that pertain to the evaluation of patients at risk for COVID-19 are in a state of rapid change based on information released by regulatory bodies including the CDC and federal and state organizations. These policies and algorithms were followed during the patient's care in the ED.  Some ED evaluations and interventions may be delayed as a result of limited staffing during the pandemic.   [place] Controlled Substance Database was reviewed by me. ____________________________________________   FINAL CLINICAL IMPRESSION(S) / ED DIAGNOSES   Final diagnoses:  Hypoglycemia associated with type 2 diabetes mellitus ([place])  Acute cystitis without hematuria      NEW MEDICATIONS STARTED DURING THIS VISIT:  ED Discharge Orders          Ordered     cephALEXin (KEFLEX) 500 MG capsule  3 times daily        06/25/21 0705             Note:  This document was prepared using Dragon voice recognition software and may include unintentional dictation errors.    Rudene Re, MD 06/25/21 [phone]

## 2021-06-25 NOTE — ED Triage Notes (Addendum)
BIB [facility] from [facility] where pt was found to have AMS. CBG on EMS arrival 22. Given 12.5g glucose via PIV to left arm by EMS with CBG recheck of 104. Alert and oriented X4 at this time. No pain.

## 2021-06-26 LAB — URINE CULTURE: Culture: <10000 — AB

## 2021-06-27 ENCOUNTER — Other Ambulatory Visit: Payer: Self-pay

## 2021-06-27 ENCOUNTER — Ambulatory Visit (INDEPENDENT_AMBULATORY_CARE_PROVIDER_SITE_OTHER): Payer: Medicare Other | Admitting: Dermatology

## 2021-06-27 DIAGNOSIS — L578 Other skin changes due to chronic exposure to nonionizing radiation: Secondary | ICD-10-CM

## 2021-06-27 DIAGNOSIS — Z872 Personal history of diseases of the skin and subcutaneous tissue: Secondary | ICD-10-CM

## 2021-06-27 DIAGNOSIS — L280 Lichen simplex chronicus: Secondary | ICD-10-CM

## 2021-06-27 NOTE — Patient Instructions (Addendum)
If you have any questions or concerns for your doctor, please call our main line at [phone] and press option 4 to reach your doctor's medical assistant. If no one answers, please leave a voicemail as directed and we will return your call as soon as possible. Messages left after 4 pm will be answered the following business day.   You may also send us a message via MyChart. We typically respond to MyChart messages within 1-2 business days.  For prescription refills, please ask your pharmacy to contact our office. Our fax number is [phone].  If you have an urgent issue when the clinic is closed that cannot wait until the next business day, you can page your doctor at the number below.    Please note that while we do our best to be available for urgent issues outside of office hours, we are not available 24/7.   If you have an urgent issue and are unable to reach us, you may choose to seek medical care at your doctor's office, retail clinic, urgent care center, or emergency room.  If you have a medical emergency, please immediately call 911 or go to the emergency department.  Pager Numbers  - Dr. Kowalski: [phone]  - Dr. Moye: [phone]  - Dr. Stewart: [phone]  In the event of inclement weather, please call our main line at [phone] for an update on the status of any delays or closures.  Dermatology Medication Tips: Please keep the boxes that topical medications come in in order to help keep track of the instructions about where and how to use these. Pharmacies typically print the medication instructions only on the boxes and not directly on the medication tubes.   If your medication is too expensive, please contact our office at [phone] option 4 or send us a message through MyChart.   We are unable to tell what your co-pay for medications will be in advance as this is different depending on your insurance coverage. However, we may be able to find a substitute  medication at lower cost or fill out paperwork to get insurance to cover a needed medication.   If a prior authorization is required to get your medication covered by your insurance company, please allow us 1-2 business days to complete this process.  Drug prices often vary depending on where the prescription is filled and some pharmacies may offer cheaper prices.  The website www.goodrx.com contains coupons for medications through different pharmacies. The prices here do not account for what the cost may be with help from insurance (it may be cheaper with your insurance), but the website can give you the price if you did not use any insurance.  - You can print the associated coupon and take it with your prescription to the pharmacy.  - You may also stop by our office during regular business hours and pick up a GoodRx coupon card.  - If you need your prescription sent electronically to a different pharmacy, notify our office through [facility] MyChart or by phone at [phone] option 4.  Instructions for [facility] Medications  One or more of your medications was sent to the [facility] mail order compounding pharmacy. You will receive an email from them and can purchase the medicine through that link. It will then be mailed to your home at the address you confirmed. If for any reason you do not receive an email from them, please check your spam folder. If you still do not find the email,   please let us know. [facility] phone number is [phone].   

## 2021-06-27 NOTE — Progress Notes (Signed)
   Follow-Up Visit   Subjective  Alex Smith is a 86 y.o. male who presents for the following: Actinic Keratosis (Face, scalp, ears, 40m f/u) and check spots (Trunk, scalp, arms, no symptoms, pt picks at).  Patient accompanied by daughter who contributes to history.  The following portions of the chart were reviewed this encounter and updated as appropriate:   Tobacco  Allergies  Meds  Problems  Med Hx  Surg Hx  Fam Hx     Review of Systems:  No other skin or systemic complaints except as noted in HPI or Assessment and Plan.  Objective  Well appearing patient in no apparent distress; mood and affect are within normal limits.  A focused examination was performed including face, trunk, arms, legs. Relevant physical exam findings are noted in the Assessment and Plan.  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  It is very difficult to treat.  Start [facility] anti itch Amitriptyline/Gabapentin/Lidocaine cream bid until clear then prn flares  May consider Dupixent in future, info given  History of actinic keratoses Head - Anterior (Face) Clear today.  Actinic Damage - chronic, secondary to cumulative UV radiation exposure/sun exposure over time - diffuse scaly erythematous macules with underlying dyspigmentation - Recommend daily broad spectrum sunscreen SPF 30+ to sun-exposed areas, reapply every 2 hours as needed.  - Recommend staying in the shade or wearing long sleeves, sun glasses (UVA+UVB protection) and wide brim hats (4-inch brim around the entire circumference of the hat). - Call for new or changing  lesions.  Return in about 2 months (around 08/27/2021) for Neurodermatitis, AK f/u.  I, Othelia Pulling, RMA, am acting as scribe for Sarina Ser, MD . Documentation: I have reviewed the above documentation for accuracy and completeness, and I agree with the above.  Sarina Ser, MD

## 2021-06-29 ENCOUNTER — Emergency Department: Payer: Medicare Other

## 2021-06-29 ENCOUNTER — Observation Stay
Admission: EM | Admit: 2021-06-29 | Discharge: 2021-06-30 | Disposition: A | Discharge status: Home / Self Care | Payer: Medicare Other | Attending: Hospitalist | Admitting: Hospitalist

## 2021-06-29 ENCOUNTER — Encounter: Payer: Self-pay | Admitting: Hematology and Oncology

## 2021-06-29 DIAGNOSIS — Z85828 Personal history of other malignant neoplasm of skin: Secondary | ICD-10-CM | POA: Diagnosis not present

## 2021-06-29 DIAGNOSIS — E1122 Type 2 diabetes mellitus with diabetic chronic kidney disease: Secondary | ICD-10-CM | POA: Diagnosis not present

## 2021-06-29 DIAGNOSIS — Z79899 Other long term (current) drug therapy: Secondary | ICD-10-CM | POA: Diagnosis not present

## 2021-06-29 DIAGNOSIS — E1165 Type 2 diabetes mellitus with hyperglycemia: Secondary | ICD-10-CM | POA: Diagnosis not present

## 2021-06-29 DIAGNOSIS — E11649 Type 2 diabetes mellitus with hypoglycemia without coma: Secondary | ICD-10-CM | POA: Diagnosis not present

## 2021-06-29 DIAGNOSIS — Z87891 Personal history of nicotine dependence: Secondary | ICD-10-CM | POA: Diagnosis not present

## 2021-06-29 DIAGNOSIS — Z794 Long term (current) use of insulin: Secondary | ICD-10-CM | POA: Insufficient documentation

## 2021-06-29 DIAGNOSIS — I129 Hypertensive chronic kidney disease with stage 1 through stage 4 chronic kidney disease, or unspecified chronic kidney disease: Secondary | ICD-10-CM | POA: Diagnosis not present

## 2021-06-29 DIAGNOSIS — N183 Chronic kidney disease, stage 3 unspecified: Secondary | ICD-10-CM | POA: Insufficient documentation

## 2021-06-29 DIAGNOSIS — Z20822 Contact with and (suspected) exposure to covid-19: Secondary | ICD-10-CM | POA: Diagnosis not present

## 2021-06-29 DIAGNOSIS — E162 Hypoglycemia, unspecified: Secondary | ICD-10-CM | POA: Diagnosis present

## 2021-06-29 DIAGNOSIS — D61810 Antineoplastic chemotherapy induced pancytopenia: Secondary | ICD-10-CM | POA: Diagnosis present

## 2021-06-29 DIAGNOSIS — I10 Essential (primary) hypertension: Secondary | ICD-10-CM | POA: Diagnosis present

## 2021-06-29 DIAGNOSIS — T451X5A Adverse effect of antineoplastic and immunosuppressive drugs, initial encounter: Secondary | ICD-10-CM | POA: Diagnosis present

## 2021-06-29 DIAGNOSIS — D472 Monoclonal gammopathy: Secondary | ICD-10-CM | POA: Diagnosis present

## 2021-06-29 LAB — CBC WITH DIFFERENTIAL/PLATELET
Abs Immature Granulocytes: 0.01 10*3/uL (ref 0.00–0.07)
Basophils Absolute: 0 10*3/uL (ref 0.0–0.1)
Basophils Relative: 0 %
Eosinophils Absolute: 0 10*3/uL (ref 0.0–0.5)
Eosinophils Relative: 1 %
HCT: 29.1 % — ABNORMAL LOW (ref 39.0–52.0)
Hemoglobin: 9.1 g/dL — ABNORMAL LOW (ref 13.0–17.0)
Immature Granulocytes: 0 %
Lymphocytes Relative: 8 %
Lymphs Abs: 0.3 10*3/uL — ABNORMAL LOW (ref 0.7–4.0)
MCH: 34.5 pg — ABNORMAL HIGH (ref 26.0–34.0)
MCHC: 31.3 g/dL (ref 30.0–36.0)
MCV: 110.2 fL — ABNORMAL HIGH (ref 80.0–100.0)
Monocytes Absolute: 0.3 10*3/uL (ref 0.1–1.0)
Monocytes Relative: 7 %
Neutro Abs: 3.2 10*3/uL (ref 1.7–7.7)
Neutrophils Relative %: 84 %
Platelets: 106 10*3/uL — ABNORMAL LOW (ref 150–400)
RBC: 2.64 MIL/uL — ABNORMAL LOW (ref 4.22–5.81)
RDW: 16.5 % — ABNORMAL HIGH (ref 11.5–15.5)
WBC: 3.8 10*3/uL — ABNORMAL LOW (ref 4.0–10.5)
nRBC: 0 % (ref 0.0–0.2)

## 2021-06-29 LAB — CBG MONITORING, ED
Glucose-Capillary: 95 mg/dL (ref 70–99)
Glucose-Capillary: 61 mg/dL — ABNORMAL LOW (ref 70–99)
Glucose-Capillary: 137 mg/dL — ABNORMAL HIGH (ref 70–99)

## 2021-06-29 LAB — GLUCOSE, CAPILLARY
Glucose-Capillary: 158 mg/dL — ABNORMAL HIGH (ref 70–99)
Glucose-Capillary: 162 mg/dL — ABNORMAL HIGH (ref 70–99)
Glucose-Capillary: 158 mg/dL — ABNORMAL HIGH (ref 70–99)
Glucose-Capillary: 109 mg/dL — ABNORMAL HIGH (ref 70–99)
Glucose-Capillary: 150 mg/dL — ABNORMAL HIGH (ref 70–99)
Glucose-Capillary: 138 mg/dL — ABNORMAL HIGH (ref 70–99)
Glucose-Capillary: 152 mg/dL — ABNORMAL HIGH (ref 70–99)

## 2021-06-29 LAB — PROCALCITONIN: Procalcitonin: <0.1 ng/mL

## 2021-06-29 LAB — COMPREHENSIVE METABOLIC PANEL
ALT: 9 U/L (ref 0–44)
AST: 17 U/L (ref 15–41)
Albumin: 3.4 g/dL — ABNORMAL LOW (ref 3.5–5.0)
Alkaline Phosphatase: 83 U/L (ref 38–126)
Anion gap: 6 (ref 5–15)
BUN: 15 mg/dL (ref 8–23)
CO2: 23 mmol/L (ref 22–32)
Calcium: 8.4 mg/dL — ABNORMAL LOW (ref 8.9–10.3)
Chloride: 109 mmol/L (ref 98–111)
Creatinine, Ser: 1.11 mg/dL (ref 0.61–1.24)
GFR, Estimated: >60 mL/min (ref >60)
Glucose, Bld: 78 mg/dL (ref 70–99)
Potassium: 4 mmol/L (ref 3.5–5.1)
Sodium: 138 mmol/L (ref 135–145)
Total Bilirubin: 0.8 mg/dL (ref 0.3–1.2)
Total Protein: 7.3 g/dL (ref 6.5–8.1)

## 2021-06-29 LAB — RESP PANEL BY RT-PCR (FLU A&B, COVID) ARPGX2
Influenza A by PCR: NEGATIVE
Influenza B by PCR: NEGATIVE
SARS Coronavirus 2 by RT PCR: NEGATIVE

## 2021-06-29 LAB — HEMOGLOBIN A1C
Hgb A1c MFr Bld: 4.9 % (ref 4.8–5.6)
Mean Plasma Glucose: 93.93 mg/dL

## 2021-06-29 LAB — TROPONIN I (HIGH SENSITIVITY)
Troponin I (High Sensitivity): 10 ng/L (ref <18)
Troponin I (High Sensitivity): 10 ng/L (ref <18)

## 2021-06-29 MED ORDER — TAMSULOSIN HCL 0.4 MG PO CAPS: 0.4000 mg | ORAL_CAPSULE | Freq: Every day | ORAL | Status: DC

## 2021-06-29 MED ORDER — ENOXAPARIN SODIUM 60 MG/0.6ML IJ SOSY
0.5000 mg/kg | PREFILLED_SYRINGE | INTRAMUSCULAR | Status: DC
  Administered 2021-06-29 – 2021-06-30 (×2): 47.5 mg via SUBCUTANEOUS
  Filled 2021-06-29 (×2): qty 0.47
  Filled 2021-06-29: qty 0.6

## 2021-06-29 MED ORDER — FAMOTIDINE 20 MG PO TABS: 20.0000 mg | ORAL_TABLET | Freq: Every day | ORAL | Status: DC

## 2021-06-29 MED ORDER — ACETAMINOPHEN 325 MG PO TABS: 650.0000 mg | ORAL_TABLET | Freq: Four times a day (QID) | ORAL | Status: DC | PRN

## 2021-06-29 MED ORDER — DOXAZOSIN MESYLATE 1 MG PO TABS
1.0000 mg | ORAL_TABLET | Freq: Every day | ORAL | Status: DC
  Administered 2021-06-29 – 2021-06-30 (×2): 1 mg via ORAL
  Filled 2021-06-29 (×3): qty 1

## 2021-06-29 MED ORDER — FAMOTIDINE 20 MG PO TABS
20.0000 mg | ORAL_TABLET | Freq: Every day | ORAL | Status: DC
  Administered 2021-06-29 – 2021-06-30 (×2): 20 mg via ORAL
  Filled 2021-06-29: qty 1

## 2021-06-29 MED ORDER — ONDANSETRON HCL 4 MG/2ML IJ SOLN: 4.0000 mg | Freq: Four times a day (QID) | INTRAMUSCULAR | Status: DC | PRN

## 2021-06-29 MED ORDER — ACETAMINOPHEN 650 MG RE SUPP
650.0000 mg | Freq: Four times a day (QID) | RECTAL | Status: DC | PRN
  Filled 2021-06-29: qty 1

## 2021-06-29 MED ORDER — TAMSULOSIN HCL 0.4 MG PO CAPS
0.4000 mg | ORAL_CAPSULE | Freq: Every day | ORAL | Status: DC
  Administered 2021-06-29 – 2021-06-30 (×2): 0.4 mg via ORAL
  Filled 2021-06-29: qty 1

## 2021-06-29 MED ORDER — INSULIN ASPART 100 UNIT/ML IJ SOLN
0.0000 [IU] | Freq: Three times a day (TID) | INTRAMUSCULAR | Status: DC
Start: 2021-06-30 — End: 2021-06-30
  Administered 2021-06-30: 5 [IU] via SUBCUTANEOUS
  Filled 2021-06-29: qty 1

## 2021-06-29 MED ORDER — ONDANSETRON HCL 4 MG PO TABS: 4.0000 mg | ORAL_TABLET | Freq: Four times a day (QID) | ORAL | Status: DC | PRN

## 2021-06-29 MED ORDER — DEXTROSE 50 % IV SOLN
50.0000 mL | Freq: Once | INTRAVENOUS | Status: AC
  Administered 2021-06-29: 50 mL via INTRAVENOUS
  Filled 2021-06-29: qty 50

## 2021-06-29 MED ORDER — INSULIN ASPART 100 UNIT/ML IJ SOLN: 0.0000 [IU] | Freq: Every day | INTRAMUSCULAR | Status: DC

## 2021-06-29 MED ORDER — DEXTROSE 10 % IV SOLN: INTRAVENOUS | Status: DC

## 2021-06-29 MED ORDER — LISINOPRIL 20 MG PO TABS
20.0000 mg | ORAL_TABLET | Freq: Two times a day (BID) | ORAL | Status: DC
  Administered 2021-06-30: 20 mg via ORAL
  Filled 2021-06-29: qty 1

## 2021-06-29 NOTE — H&P (Addendum)
History and Physical    Alex Smith DGU:[id number] DOB: [birth year] DOA: 06/29/2021  PCP: Leonel Ramsay, MD   Patient coming from:SNF  I have personally briefly reviewed patient's relevant medical records in [facility]  Chief Complaint: Decreased responsiveness  HPI: Alex Smith is a 86 y.o. male with medical history significant for HTN, smoldering myeloma on chemotherapy, pancytopenia related to chemo, insulin-dependent type 2 diabetes, seen in the ED 4 days prior with hypoglycemia, discharged on antibiotics for UTI, who was presents from SNF after he was found with decreased responsiveness with last known well a few hours prior.. .  EMS found him to a blood sugar in the 20s and administered to 50 mL of D10 on their arrival and also placed him on NRB for O2 sat of 87% on room air.  Patient became more responsive with EMS intervention and was awake alert and oriented by arrival in the ED.  Patient has little recollection of the events at the nursing facility but states he was previously feeling his usual self when he went to bed.  Currently denies feeling unwell.  Denies chest pain, shortness of breath, nausea or vomiting, abdominal pain, dysuria or diarrhea.  Denies headache or visual disturbance, weakness numbness or tingling in the face or extremities.  Of note, patient also has history of recent hospitalization from 9/12-9/23 with severe sepsis secondary to CAP presenting at that time with acute encephalopathy and discharged to SNF  ED course: On arrival: BP 154/88 with otherwise normal vitals Labs: WBC 3.8, hemoglobin 9.1, platelets 106, at baseline for pancytopenia troponin normal at 10 procalcitonin less than 0.1  EKG, personally viewed and interpreted: Sinus tachycardia at 103 with no acute ST-T wave changes  Imaging: Chest x-ray showing mild cardiomegaly with mild vascular congestion as well as bilateral lower lung field streaky nodular densities which may represent  atelectasis or developing infiltrate  Capillary blood glucose in the ED trended down 9560 patient was subsequently started on a D10 infusion.  Hospitalist consulted for admission    Review of Systems: As per HPI otherwise all other systems on review of systems negative.    Past Medical History:  Diagnosis Date   Anemia    B12 deficiency    Basal cell carcinoma 03/30/2018   left ant nasal ala   Basal cell carcinoma of face 01/19/2013   right distal dorsum nose   BPH (benign prostatic hypertrophy)    followed by urology, discharged (Dr. 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 pretibial. 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 EDC 09/07/2020   Squamous cell carcinoma of skin 04/03/2020   Left cheek. WD SCC   Systolic murmur 95/63/8756   Type 2 diabetes, controlled, with retinopathy ([place])     Past Surgical History:  Procedure Laterality Date   25 GAUGE PARS PLANA  VITRECTOMY WITH 20 GAUGE MVR PORT FOR MACULAR HOLE  07/07/2012   Procedure: 25 GAUGE PARS PLANA VITRECTOMY WITH 20 GAUGE MVR PORT FOR MACULAR HOLE;  Surgeon: Hayden Pedro, MD;  Location: [facility];  Service: Ophthalmology;  Laterality: Left;   BONE MARROW BIOPSY  2016   CARDIOVASCULAR STRESS TEST  2013   treadmill - no evidence ischemia, EF 61%   CATARACT EXTRACTION W/ INTRAOCULAR LENS  IMPLANT, BILATERAL  ~ 2010   COLONOSCOPY  2014   [facility] WNL no rpt needed, h/o  polyps   EYE SURGERY Left 06/2012   laser surgery   GAS INSERTION  07/07/2012   Procedure: INSERTION OF GAS;  Surgeon: Hayden Pedro, MD;  Location: [facility];  Service: Ophthalmology;  Laterality: Left;  C3F8   PERIPHERAL VASCULAR CATHETERIZATION N/A 02/23/2015   Procedure: Glori Luis Cath Insertion;  Surgeon: Algernon Huxley, MD;  Location: [facility];  Service: Cardiovascular;  Laterality: N/A;   PORTA CATH REMOVAL N/A 02/16/2020   Procedure: PORTA CATH REMOVAL;  Surgeon: Algernon Huxley, MD;  Location: [facility];  Service: Cardiovascular;  Laterality: N/A;   SERUM PATCH  07/07/2012   Procedure: SERUM PATCH;  Surgeon: Hayden Pedro, MD;  Location: [facility];  Service: Ophthalmology;  Laterality: Left;   SKIN CANCER EXCISION     "all over my face" (07/07/2012)     reports that he quit smoking about 52 years ago. His smoking use included cigarettes. He has a 50.00 pack-year smoking history. He has never used smokeless tobacco. He reports current alcohol use. He reports that he does not use drugs.  Allergies  Allergen Reactions   Rituximab Rash    Chest tightness Chest tightness   Blood-Group Specific Substance Other (See Comments)    Had a post transfusion reaction of red blood cells; NOW REQUIRES WASHED BLOOD CELLS Had a post transfusion reaction of red blood cells; NOW REQUIRES WASHED BLOOD CELLS   Primaxin [Imipenem] Other (See Comments)    Possible allergy Possible allergy   Voriconazole Other (See Comments)   Sulfa Antibiotics Itching and Rash   Sulfacetamide Sodium Itching and Rash    Family History  Problem Relation Age of Onset   Dementia Mother    Heart failure Father 67   Cancer Sister        breast   Diabetes Paternal Uncle    Diabetes Paternal Aunt    CAD Brother 71       MI   Stroke Neg Hx       Prior to Admission medications   Medication Sig Start Date End Date Taking? Authorizing Provider  amLODipine (NORVASC) 5 MG tablet Take 1 tablet (5 mg total)  by mouth daily. 05/12/21   Dessa Phi, DO  aspirin EC 81 MG tablet Take 81 mg by mouth daily.    [provider]  cephALEXin (KEFLEX) 500 MG capsule Take 1 capsule (500 mg total) by mouth 3 (three) times daily for 7 days. 06/25/21 07/02/21  Rudene Re, MD  doxazosin (CARDURA) 1 MG tablet TAKE ONE TABLET BY MOUTH EVERY DAY 08/31/19   Ria Bush, MD  famotidine (PEPCID) 40 MG tablet Take 0.5 tablets (20 mg total) by mouth daily. NEEDS OFFICE VISIT 07/16/20   Ria Bush, MD  glucose blood (ACCU-CHEK AVIVA PLUS) test strip USE TO CHECK SUGAR DAILY E11.319 08/28/18   Ria Bush, MD  HUMULIN N 100 UNIT/ML injection INJECT 39 UNITS SUBCUTANEOUSLY TWICE DAILY BEFORE A MEAL Patient  taking differently: Inject 35 Units into the skin 2 (two) times daily before a meal. Pt uses this as sliding scale 06/23/20   Ria Bush, MD  HUMULIN R 100 UNIT/ML injection INJECT 10 UNITS INTO THE SKIN TWICE DAILY WITH MEALS Patient taking differently: Inject 5 Units into the skin 2 (two) times daily before a meal. 11/09/19   Ria Bush, MD  lisinopril (ZESTRIL) 20 MG tablet TAKE ONE TABLET BY MOUTH TWICE DAILY 03/01/20   Ria Bush, MD  Microlet Lancets MISC USE AS DIRECTED 10/28/19   Ria Bush, MD  omeprazole (PRILOSEC) 20 MG capsule Take 1 capsule by mouth 1 day or 1 dose. Patient not taking: Reported on 06/25/2021    [provider]  pravastatin (PRAVACHOL) 20 MG tablet TAKE ONE TABLET EVERY EVENING 03/16/20   Ria Bush, MD  QUEtiapine (SEROQUEL) 50 MG tablet Take 1 tablet (50 mg total) by mouth at bedtime. Patient not taking: Reported on 06/25/2021 05/11/21   Dessa Phi, DO  tamsulosin (FLOMAX) 0.4 MG CAPS capsule TAKE 1 CAPSULE BY MOUTH EVERY DAY 12/11/17   Ria Bush, MD  traZODone (DESYREL) 50 MG tablet TAKE 1/2 TO 1 TABLET BY MOUTH AT [facility] NEEDED FOR SLEEP Patient not taking: Reported on 06/25/2021 01/23/17   Ria Bush, MD   Flossie Buffy INSULIN SYRINGE 31G X 5/16" 0.5 ML MISC See admin instructions. 01/24/20   [provider]  vitamin B-12 (CYANOCOBALAMIN) 1000 MCG tablet Take 1 tablet (1,000 mcg total) by mouth daily. 06/01/19   Ria Bush, MD  vitamin E 400 UNIT capsule Take 200 Units by mouth daily.     [provider]    Physical Exam: Vitals:   06/29/21 0259 06/29/21 0300 06/29/21 0400  BP: (!) 154/88  (!) 140/47  Pulse: 91  92  Resp: 18    Temp: 98.3 F (36.8 C)    TempSrc: Oral    SpO2: 98%  97%  Weight:  94 kg   Height:  _0  (1.702 m)    Constitutional: Alert and oriented x 3 . Not in any apparent distress HEENT:      Head: Normocephalic and atraumatic.         Eyes: PERLA, EOMI, Conjunctivae are normal. Sclera is non-icteric.       Mouth/Throat: Mucous membranes are moist.       Neck: Supple with no signs of meningismus. Cardiovascular: Regular rate and rhythm. No murmurs, gallops, or rubs. 2+ symmetrical distal pulses are present . No JVD. No  LE edema Respiratory: Respiratory effort normal .Lungs sounds clear bilaterally. No wheezes, crackles, or rhonchi.  Gastrointestinal: Soft, non tender, non distended. Positive bowel sounds.  Genitourinary: No CVA tenderness. Musculoskeletal: Nontender with normal range of motion in all extremities. No cyanosis, or erythema of extremities. Neurologic:  Face is symmetric. Moving all extremities. No gross focal neurologic deficits . Skin: Skin is warm, dry.  Multiple shallow ulcerated rash on face trunk and extremities in symmetrical distribution, healing Psychiatric: Mood and affect are appropriate    Labs on Admission: I have personally reviewed following labs and imaging studies  CBC: Recent Labs  Lab 06/25/21 0041 06/29/21 0257  WBC 2.5* 3.8*  NEUTROABS 2.1 3.2  HGB 8.0* 9.1*  HCT 25.3* 29.1*  MCV 109.5* 110.2*  PLT 100* 025*   Basic Metabolic Panel: Recent Labs  Lab 06/25/21 0041 06/29/21 0257  NA 139 138  K  3.4* 4.0  CL 110 109  CO2 23 23  GLUCOSE 66* 78  BUN  17 15  CREATININE 1.27* 1.11  CALCIUM 8.2* 8.4*   GFR: Estimated Creatinine Clearance: 54.2 mL/min (by C-G formula based on SCr of 1.11 mg/dL). Liver Function Tests: Recent Labs  Lab 06/25/21 0041 06/29/21 0257  AST 13* 17  ALT 10 9  ALKPHOS 75 83  BILITOT 0.9 0.8  PROT 6.7 7.3  ALBUMIN 3.2* 3.4*   No results for input(s): LIPASE, AMYLASE in the last 168 hours. No results for input(s): AMMONIA in the last 168 hours. Coagulation Profile: No results for input(s): INR, PROTIME in the last 168 hours. Cardiac Enzymes: No results for input(s): CKTOTAL, CKMB, CKMBINDEX, TROPONINI in the last 168 hours. BNP (last 3 results) No results for input(s): PROBNP in the last 8760 hours. HbA1C: No results for input(s): HGBA1C in the last 72 hours. CBG: Recent Labs  Lab 06/25/21 0428 06/25/21 0532 06/25/21 0702 06/29/21 0237 06/29/21 0413  GLUCAP 102* 97 91 95 61*   Lipid Profile: No results for input(s): CHOL, HDL, LDLCALC, TRIG, CHOLHDL, LDLDIRECT in the last 72 hours. Thyroid Function Tests: No results for input(s): TSH, T4TOTAL, FREET4, T3FREE, THYROIDAB in the last 72 hours. Anemia Panel: No results for input(s): VITAMINB12, FOLATE, FERRITIN, TIBC, IRON, RETICCTPCT in the last 72 hours. Urine analysis:    Component Value Date/Time   COLORURINE YELLOW (A) 06/25/2021 0541   APPEARANCEUR CLEAR (A) 06/25/2021 0541   LABSPEC 1.014 06/25/2021 0541   PHURINE 5.0 06/25/2021 0541   GLUCOSEU 50 (A) 06/25/2021 0541   HGBUR NEGATIVE 06/25/2021 0541   BILIRUBINUR NEGATIVE 06/25/2021 0541   BILIRUBINUR Negative 08/02/2016 1212   KETONESUR NEGATIVE 06/25/2021 0541   PROTEINUR NEGATIVE 06/25/2021 0541   UROBILINOGEN 0.2 08/02/2016 1212   NITRITE NEGATIVE 06/25/2021 0541   LEUKOCYTESUR MODERATE (A) 06/25/2021 0541    Radiological Exams on Admission: DG Chest Portable 1 View  Result Date: 06/29/2021 CLINICAL DATA:  Shortness  of breath. EXAM: PORTABLE CHEST 1 VIEW COMPARISON:  Chest radiograph dated 06/25/2021. FINDINGS: There is mild cardiomegaly with mild vascular congestion. Bilateral lower lung field streaky and nodular densities may represent atelectasis or developing infiltrate. No focal consolidation, pleural effusion or pneumothorax. No acute osseous pathology. IMPRESSION: 1. Mild cardiomegaly with mild vascular congestion. 2. Bilateral lower lung field streaky and nodular densities may represent atelectasis or developing infiltrate. Electronically Signed   By: Anner Crete M.D.   On: 06/29/2021 03:07    Assessment/Plan  Recurrent hypoglycemia Insulin-dependent type 2 diabetes -Suspect related to current NPH dosing - No evidence of acute infection to explain hypoglycemia, the patient is noted to have a rash that is healing - Urine culture from ED visit on 11/7 was negative - Urinalysis unremarkable - Chest x-ray showing possible early infiltrate but procalcitonin <0.10 - Continue to monitor for stigmata of infection - Continue D10 with fingersticks every hour until stable then resume insulin as appropriate  Generalized rash - Symmetrical distribution, healing(present for the past month)  Acute metabolic encephalopathy secondary to hypoglycemia - Resolved by admission - Management as above    Pancytopenia due to antineoplastic chemotherapy ([place]) Smoldering myeloma - Stable for the past 1 month - Continue to monitor - Patient follows with hematologist Dr. Janese Banks    Essential hypertension - Controlled - Continue home lisinopril and amlodipine  DVT prophylaxis: Lovenox  Code Status: full code  Family Communication:  none  Disposition Plan: Back to previous home environment Consults called: none  Status: Observation   Athena Masse MD [facility]   06/29/2021, 4:47 AM

## 2021-06-29 NOTE — ED Triage Notes (Signed)
Pt to ED via EMS from [facility] c/o hypoglycemia.  Per EMS pt was checked on by staff approx 2300 and was normal and two hours later was found to be AMS and unresponsive, CBG 21 on EMS arrival, given 268mL D10 and CBG up to 124.  Pt was 87% RA and placed on NRB up to 97%.  EMS states patient became more responsive after fluid completion, recently being treated for UTI.  Pt alert upon arrival to ED and oriented to self, place and time.

## 2021-06-29 NOTE — Progress Notes (Addendum)
PROGRESS NOTE    Alex Smith  UXN:[id number] DOB: 1935/06/24 DOA: 06/29/2021 PCP: Leonel Ramsay, MD  [phone]   Assessment & Plan:   Principal Problem:   Type 2 diabetes mellitus with hypoglycemia without coma [facility]) Active Problems:   Essential hypertension   Smoldering myeloma   Pancytopenia due to antineoplastic chemotherapy ([place])   Hypoglycemia   Alex Smith is a 86 y.o. male with medical history significant for HTN, smoldering myeloma on chemotherapy, pancytopenia related to chemo, insulin-dependent type 2 diabetes, seen in the ED 4 days prior with hypoglycemia, discharged on antibiotics for UTI, who was presents from SNF after he was found with decreased responsiveness with last known well a few hours prior.  EMS found him to a blood sugar in the 20s and administered to 50 mL of D10 on their arrival and also placed him on NRB for O2 sat of 87% on room air.  Patient became more responsive with EMS intervention and was awake alert and oriented by arrival in the ED.  Patient has little recollection of the events at the nursing facility but states he was previously feeling his usual self when he went to bed.   Recurrent hypoglycemia Type 2 diabetes -Suspect due to too much insulin.  Home med listed Humulin N 35u BID and Humulin R 5u BID.   --since presentation, pt's BG has been under 160's without any insulin. --last A1c 5.7 about 2 months ago which is too low for his age. Plan: --ACHS SSI for now  Generalized rash - Symmetrical distribution, healing (present for the past month)   Acute metabolic encephalopathy secondary to hypoglycemia - Resolved by admission - Management as above     Pancytopenia due to antineoplastic chemotherapy ([place]) Smoldering myeloma - Stable for the past 1 month - Continue to monitor - Patient follows with hematologist Dr. Janese Banks     Essential hypertension - Controlled --resume home Lisinopril tomorrow --hold home  amlodipine  BPH --cont home Cardura and Flomax  HLD --resume home statin after discharge   DVT prophylaxis: Lovenox SQ Code Status: Full code  Family Communication:  Level of care: Med-Surg Dispo:   The patient is from: SNF Anticipated d/c is to: SNF Anticipated d/c date is: likely tomorrow Patient currently is not medically ready to d/c due to: monitor for hypoglycemia   Subjective and Interval History:  Pt denied pain, and tolerating oral intake.     Objective: Vitals:   06/29/21 0700 06/29/21 0935 06/29/21 1100 06/29/21 1847  BP: (!) 145/67 (!) 114/57 140/72 139/71  Pulse: 85 89 83 84  Resp: (!) 22 19 (!) 23 19  Temp:    98.6 F (37 C)  TempSrc:    Oral  SpO2: 98% 97% 97% 98%  Weight:      Height:        Intake/Output Summary (Last 24 hours) at 06/29/2021 1958 Last data filed at 06/29/2021 1804 Gross per 24 hour  Intake 273.61 ml  Output 300 ml  Net -26.39 ml   Filed Weights   06/29/21 0300  Weight: 94 kg    Examination:   Constitutional: NAD, alert, oriented to person and hospital HEENT: conjunctivae and lids normal, EOMI CV: No cyanosis.   RESP: normal respiratory effort, on RA SKIN: warm, dry Neuro: II - XII grossly intact.   Psych: Normal mood and affect.     Data Reviewed: I have personally reviewed following labs and imaging studies  CBC: Recent Labs  Lab 06/25/21 0041  06/29/21 0257  WBC 2.5* 3.8*  NEUTROABS 2.1 3.2  HGB 8.0* 9.1*  HCT 25.3* 29.1*  MCV 109.5* 110.2*  PLT 100* 601*   Basic Metabolic Panel: Recent Labs  Lab 06/25/21 0041 06/29/21 0257  NA 139 138  K 3.4* 4.0  CL 110 109  CO2 23 23  GLUCOSE 66* 78  BUN 17 15  CREATININE 1.27* 1.11  CALCIUM 8.2* 8.4*   GFR: Estimated Creatinine Clearance: 54.2 mL/min (by C-G formula based on SCr of 1.11 mg/dL). Liver Function Tests: Recent Labs  Lab 06/25/21 0041 06/29/21 0257  AST 13* 17  ALT 10 9  ALKPHOS 75 83  BILITOT 0.9 0.8  PROT 6.7 7.3  ALBUMIN 3.2* 3.4*    No results for input(s): LIPASE, AMYLASE in the last 168 hours. No results for input(s): AMMONIA in the last 168 hours. Coagulation Profile: No results for input(s): INR, PROTIME in the last 168 hours. Cardiac Enzymes: No results for input(s): CKTOTAL, CKMB, CKMBINDEX, TROPONINI in the last 168 hours. BNP (last 3 results) No results for input(s): PROBNP in the last 8760 hours. HbA1C: No results for input(s): HGBA1C in the last 72 hours. CBG: Recent Labs  Lab 06/29/21 0645 06/29/21 0750 06/29/21 0959 06/29/21 1138 06/29/21 1607  GLUCAP 162* 158* 109* 150* 138*   Lipid Profile: No results for input(s): CHOL, HDL, LDLCALC, TRIG, CHOLHDL, LDLDIRECT in the last 72 hours. Thyroid Function Tests: No results for input(s): TSH, T4TOTAL, FREET4, T3FREE, THYROIDAB in the last 72 hours. Anemia Panel: No results for input(s): VITAMINB12, FOLATE, FERRITIN, TIBC, IRON, RETICCTPCT in the last 72 hours. Sepsis Labs: Recent Labs  Lab 06/29/21 0257  PROCALCITON <0.10    Recent Results (from the past 240 hour(s))  Resp Panel by RT-PCR (Flu A&B, Covid) Nasopharyngeal Swab     Status: None   Collection Time: 06/25/21 12:41 AM   Specimen: Nasopharyngeal Swab; Nasopharyngeal(NP) swabs in vial transport medium  Result Value Ref Range Status   SARS Coronavirus 2 by RT PCR NEGATIVE NEGATIVE Final    Comment: (NOTE) SARS-CoV-2 target nucleic acids are NOT DETECTED.  The SARS-CoV-2 RNA is generally detectable in upper respiratory specimens during the acute phase of infection. The lowest concentration of SARS-CoV-2 viral copies this assay can detect is 138 copies/mL. A negative result does not preclude SARS-Cov-2 infection and should not be used as the sole basis for treatment or other patient management decisions. A negative result may occur with  improper specimen collection/handling, submission of specimen other than nasopharyngeal swab, presence of viral mutation(s) within the areas  targeted by this assay, and inadequate number of viral copies(<138 copies/mL). A negative result must be combined with clinical observations, patient history, and epidemiological information. The expected result is Negative.  Fact Sheet for Patients:  EntrepreneurPulse.com.au  Fact Sheet for Healthcare Providers:  IncredibleEmployment.be  This test is no t yet approved or cleared by the [place] FDA and  has been authorized for detection and/or diagnosis of SARS-CoV-2 by FDA under an Emergency Use Authorization (EUA). This EUA will remain  in effect (meaning this test can be used) for the duration of the COVID-19 declaration under Section 564(b)(1) of the Act, 21 U.S.C.section 360bbb-3(b)(1), unless the authorization is terminated  or revoked sooner.       Influenza A by PCR NEGATIVE NEGATIVE Final   Influenza B by PCR NEGATIVE NEGATIVE Final    Comment: (NOTE) The Xpert Xpress SARS-CoV-2/FLU/RSV plus assay is intended as an aid in the diagnosis of influenza from  Nasopharyngeal swab specimens and should not be used as a sole basis for treatment. Nasal washings and aspirates are unacceptable for Xpert Xpress SARS-CoV-2/FLU/RSV testing.  Fact Sheet for Patients: EntrepreneurPulse.com.au  Fact Sheet for Healthcare Providers: IncredibleEmployment.be  This test is not yet approved or cleared by the [place] FDA and has been authorized for detection and/or diagnosis of SARS-CoV-2 by FDA under an Emergency Use Authorization (EUA). This EUA will remain in effect (meaning this test can be used) for the duration of the COVID-19 declaration under Section 564(b)(1) of the Act, 21 U.S.C. section 360bbb-3(b)(1), unless the authorization is terminated or revoked.  Performed at [facility], [address] [place], [place] [address]   Urine Culture     Status: Abnormal   Collection Time:  06/25/21  5:41 AM   Specimen: Urine, Clean Catch  Result Value Ref Range Status   Specimen Description   Final    URINE, CLEAN CATCH Performed at [facility], [address], [place], [place] [address]    Special Requests   Final    NONE Performed at [facility], [address] [place], [place] [address]    Culture (A)  Final    <10,000 COLONIES/mL INSIGNIFICANT GROWTH Performed at [facility] [address], [place],  [address]    Report Status 06/26/2021 FINAL  Final  Resp Panel by RT-PCR (Flu A&B, Covid) Nasopharyngeal Swab     Status: None   Collection Time: 06/29/21  4:13 AM   Specimen: Nasopharyngeal Swab; Nasopharyngeal(NP) swabs in vial transport medium  Result Value Ref Range Status   SARS Coronavirus 2 by RT PCR NEGATIVE NEGATIVE Final    Comment: (NOTE) SARS-CoV-2 target nucleic acids are NOT DETECTED.  The SARS-CoV-2 RNA is generally detectable in upper respiratory specimens during the acute phase of infection. The lowest concentration of SARS-CoV-2 viral copies this assay can detect is 138 copies/mL. A negative result does not preclude SARS-Cov-2 infection and should not be used as the sole basis for treatment or other patient management decisions. A negative result may occur with  improper specimen collection/handling, submission of specimen other than nasopharyngeal swab, presence of viral mutation(s) within the areas targeted by this assay, and inadequate number of viral copies(<138 copies/mL). A negative result must be combined with clinical observations, patient history, and epidemiological information. The expected result is Negative.  Fact Sheet for Patients:  EntrepreneurPulse.com.au  Fact Sheet for Healthcare Providers:  IncredibleEmployment.be  This test is no t yet approved or cleared by the [place] FDA and  has been authorized for detection and/or diagnosis of SARS-CoV-2  by FDA under an Emergency Use Authorization (EUA). This EUA will remain  in effect (meaning this test can be used) for the duration of the COVID-19 declaration under Section 564(b)(1) of the Act, 21 U.S.C.section 360bbb-3(b)(1), unless the authorization is terminated  or revoked sooner.       Influenza A by PCR NEGATIVE NEGATIVE Final   Influenza B by PCR NEGATIVE NEGATIVE Final    Comment: (NOTE) The Xpert Xpress SARS-CoV-2/FLU/RSV plus assay is intended as an aid in the diagnosis of influenza from Nasopharyngeal swab specimens and should not be used as a sole basis for treatment. Nasal washings and aspirates are unacceptable for Xpert Xpress SARS-CoV-2/FLU/RSV testing.  Fact Sheet for Patients: EntrepreneurPulse.com.au  Fact Sheet for Healthcare Providers: IncredibleEmployment.be  This test is not yet approved or cleared by the [place] FDA and has been authorized for detection and/or diagnosis  of SARS-CoV-2 by FDA under an Emergency Use Authorization (EUA). This EUA will remain in effect (meaning this test can be used) for the duration of the COVID-19 declaration under Section 564(b)(1) of the Act, 21 U.S.C. section 360bbb-3(b)(1), unless the authorization is terminated or revoked.  Performed at [facility], [address], [place], [place] [address]       Radiology Studies: DG Chest Portable 1 View  Result Date: 06/29/2021 CLINICAL DATA:  Shortness of breath. EXAM: PORTABLE CHEST 1 VIEW COMPARISON:  Chest radiograph dated 06/25/2021. FINDINGS: There is mild cardiomegaly with mild vascular congestion. Bilateral lower lung field streaky and nodular densities may represent atelectasis or developing infiltrate. No focal consolidation, pleural effusion or pneumothorax. No acute osseous pathology. IMPRESSION: 1. Mild cardiomegaly with mild vascular congestion. 2. Bilateral lower lung field streaky and nodular densities may  represent atelectasis or developing infiltrate. Electronically Signed   By: Anner Crete M.D.   On: 06/29/2021 03:07     Scheduled Meds:  enoxaparin (LOVENOX) injection  0.5 mg/kg Subcutaneous Q24H   Continuous Infusions:   LOS: 0 days   No charge note.   Enzo Bi, MD [facility] If 7PM-7AM, please contact night-coverage 06/29/2021, 7:58 PM

## 2021-06-29 NOTE — Progress Notes (Signed)
Anticoagulation monitoring(Lovenox):  86 yo  male ordered Lovenox 40 mg Q24h    Filed Weights   06/29/21 0300  Weight: 94 kg (207 lb 3.7 oz)   BMI 32.5    Lab Results  Component Value Date   CREATININE 1.11 06/29/2021   CREATININE 1.27 (H) 06/25/2021   CREATININE 1.66 (H) 06/01/2021   Estimated Creatinine Clearance: 54.2 mL/min (by C-G formula based on SCr of 1.11 mg/dL). Hemoglobin & Hematocrit     Component Value Date/Time   HGB 9.1 (L) 06/29/2021 0257   HGB 9.9 (L) 12/14/2014 1109   HGB 13.0 02/18/2013 0000   HCT 29.1 (L) 06/29/2021 0257   HCT 29.2 (L) 12/14/2014 1109     Per Protocol for Patient with estCrcl >30 ml/min and BMI > 30, will transition to Lovenox 47.5 mg Q24h.

## 2021-06-29 NOTE — ED Notes (Signed)
Pt provided orange juice, graham crackers w/peanut butter.

## 2021-06-29 NOTE — ED Provider Notes (Signed)
[facility] Emergency Department Provider Note  ____________________________________________   Event Date/Time   First MD Initiated Contact with Patient 06/29/21 0234     (approximate)  I have reviewed the triage vital signs and the nursing notes.   HISTORY  Chief Complaint Hypoglycemia    HPI Alex Smith is a 86 y.o. male anemia, chronic kidney disease, pneumonia, shingles, hyperlipidemia, hypertension who comes in with concerns for low blood sugar.  Patient was checked around 11 PM and was his normal self and then when they rechecked, around 2 AM they noted him to be altered, having poor respirations and glucose was noted to be in the 20s.  Patient got 250 cc of D10 with sugars now in the 100s.  Since then patient has woken up and seems to be acting his normal self.  Patient does not really remember what happened.  He is currently alert and oriented x3.  He denies any complaints or concerns.  On review of records patient was seen on 11/7 for a low sugars as well.  At that time he reports eating and drinking normally.  Patient is on both long and short acting insulin with no changes in regimen.  At that time patient was monitored for 5 hours without any additional episodes of hypoglycemia.  There was concern for maybe a mild UTI so patient was given a dose of Rocephin and started on Keflex.  I reviewed the prior urine culture and this did not grow anything.  It appears that patient is on 35 units of Humulin N twice daily as well as Humulin are 5 units twice daily.            Past Medical History:  Diagnosis Date   Anemia    B12 deficiency    Basal cell carcinoma 03/30/2018   left ant nasal ala   Basal cell carcinoma of face 01/19/2013   right distal dorsum nose   BPH (benign prostatic hypertrophy)    followed by urology, discharged (Dr. 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 pretibial. 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 EDC 09/07/2020   Squamous cell carcinoma of skin 04/03/2020   Left cheek. WD SCC   Systolic murmur 25/12/3974   Type 2 diabetes, controlled, with retinopathy [facility])     Patient Active Problem List   Diagnosis Date Noted   Severe sepsis ([place]) 05/04/2021   CAP (community acquired pneumonia) 05/02/2021   Vitamin D deficiency 03/10/2019   Depressed mood 03/10/2019   CKD stage 3 due to type 2 diabetes mellitus ([place]) 11/14/2017   Goals of care, counseling/discussion 05/27/2017   Pancytopenia due to antineoplastic chemotherapy ([place]) 11/17/2016   Chemotherapy-induced neutropenia (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   Myelodysplasia present in bone marrow ([place]) 11/03/2015   Advanced care planning/counseling discussion 05/22/2015   Insomnia 03/03/2015   Monoclonal gammopathy 02/03/2015   Macrocytic anemia 02/03/2015   Severe obesity (BMI 35.0-39.9) with comorbidity ([place]) 10/12/2014   History of nonmelanoma skin cancer 10/10/2014   Medicare annual wellness visit, subsequent 04/12/2014   Health maintenance examination 04/12/2014   Hairy cell leukemia, in remission ([place])    B12 deficiency    BPH with obstruction/lower urinary tract symptoms    Essential  hypertension    GERD (gastroesophageal reflux disease)    Dyslipidemia    Type 2 diabetes, controlled, with retinopathy ([place])    Diverticulosis    Macular hole 07/07/2012    Past Surgical History:  Procedure Laterality Date   25 GAUGE PARS PLANA VITRECTOMY WITH 20 GAUGE MVR PORT FOR MACULAR HOLE  07/07/2012   Procedure: 25 GAUGE PARS PLANA VITRECTOMY WITH 20 GAUGE MVR PORT FOR MACULAR HOLE;  Surgeon: Hayden Pedro, MD;  Location: [place];  Service: Ophthalmology;  Laterality: Left;   BONE MARROW BIOPSY  2016   CARDIOVASCULAR STRESS TEST  2013   treadmill - no evidence ischemia, EF 61%   CATARACT EXTRACTION W/ INTRAOCULAR LENS  IMPLANT, BILATERAL  ~ 2010   COLONOSCOPY  2014   [place] WNL no rpt needed, h/o polyps   EYE SURGERY Left 06/2012   laser surgery   GAS INSERTION  07/07/2012   Procedure: INSERTION OF GAS;  Surgeon: Hayden Pedro, MD;  Location: [place];  Service: Ophthalmology;  Laterality: Left;  C3F8   PERIPHERAL VASCULAR CATHETERIZATION N/A 02/23/2015   Procedure: Glori Luis Cath Insertion;  Surgeon: Algernon Huxley, MD;  Location: [place] [facility];  Service: Cardiovascular;  Laterality: N/A;   PORTA CATH REMOVAL N/A 02/16/2020   Procedure: PORTA CATH REMOVAL;  Surgeon: Algernon Huxley, MD;  Location: [place] [facility];  Service: Cardiovascular;  Laterality: N/A;   SERUM PATCH  07/07/2012   Procedure: SERUM PATCH;  Surgeon: Hayden Pedro, MD;  Location: [place];  Service: Ophthalmology;  Laterality: Left;   SKIN CANCER EXCISION     "all over my face" (07/07/2012)    Prior to Admission medications   Medication Sig Start Date End Date Taking? Authorizing Provider  amLODipine (NORVASC) 5 MG tablet Take 1 tablet (5 mg total) by mouth daily. 05/12/21   Dessa Phi, DO  aspirin EC 81 MG tablet Take 81 mg by mouth daily.    [provider]  cephALEXin (KEFLEX) 500 MG capsule Take 1 capsule (500 mg total) by mouth 3 (three) times daily for 7 days. 06/25/21 07/02/21  Rudene Re, MD  doxazosin (CARDURA) 1 MG tablet TAKE ONE TABLET BY MOUTH EVERY DAY 08/31/19   Ria Bush, MD  famotidine (PEPCID) 40 MG tablet Take 0.5 tablets (20 mg total) by mouth daily. NEEDS OFFICE VISIT 07/16/20   Ria Bush, MD  glucose blood (ACCU-CHEK AVIVA PLUS) test strip USE TO CHECK SUGAR DAILY E11.319 08/28/18   Ria Bush, MD  HUMULIN N 100 UNIT/ML injection INJECT 39 UNITS SUBCUTANEOUSLY TWICE DAILY BEFORE A MEAL Patient taking differently: Inject 35 Units into the skin 2 (two) times daily before a meal. Pt uses this as sliding scale 06/23/20   Ria Bush, MD  HUMULIN R 100 UNIT/ML injection INJECT 10 UNITS INTO THE SKIN TWICE DAILY WITH MEALS Patient taking differently: Inject 5 Units into the skin  2 (two) times daily before a meal. 11/09/19   Ria Bush, MD  lisinopril (ZESTRIL) 20 MG tablet TAKE ONE TABLET BY MOUTH TWICE DAILY 03/01/20   Ria Bush, MD  Microlet Lancets MISC USE AS DIRECTED 10/28/19   Ria Bush, MD  omeprazole (PRILOSEC) 20 MG capsule Take 1 capsule by mouth 1 day or 1 dose. Patient not taking: Reported on 06/25/2021    [provider]  pravastatin (PRAVACHOL) 20 MG tablet TAKE ONE TABLET EVERY EVENING 03/16/20   Ria Bush, MD  QUEtiapine (SEROQUEL) 50 MG tablet Take 1 tablet (50 mg total) by mouth at bedtime. Patient not taking: Reported on 06/25/2021 05/11/21   Dessa Phi, DO  tamsulosin (FLOMAX) 0.4 MG CAPS capsule TAKE 1 CAPSULE BY MOUTH EVERY DAY 12/11/17   Ria Bush, MD  traZODone (DESYREL) 50 MG tablet TAKE 1/2 TO 1 TABLET BY MOUTH AT [facility] NEEDED FOR SLEEP Patient not taking: Reported on 06/25/2021 01/23/17   Ria Bush, MD  Flossie Buffy INSULIN SYRINGE 31G X 5/16" 0.5 ML MISC See admin instructions. 01/24/20   [provider]  vitamin B-12 (CYANOCOBALAMIN) 1000 MCG tablet Take 1 tablet (1,000 mcg total) by mouth daily. 06/01/19   Ria Bush, MD  vitamin E 400 UNIT  capsule Take 200 Units by mouth daily.     [provider]    Allergies Rituximab, Blood-group specific substance, Primaxin [imipenem], Voriconazole, Sulfa antibiotics, and Sulfacetamide sodium  Family History  Problem Relation Age of Onset   Dementia Mother    Heart failure Father 34   Cancer Sister        breast   Diabetes Paternal Uncle    Diabetes Paternal Aunt    CAD Brother 98       MI   Stroke Neg Hx     Social History Social History   Tobacco Use   Smoking status: Former    Packs/day: 2.00    Years: 25.00    Pack years: 50.00    Types: Cigarettes    Quit date: 02/06/1969    Years since quitting: 52.4   Smokeless tobacco: Never   Tobacco comments:    07/07/2012 "stopped smoking ~ 40 yr ago; smoked 20-38yr"  Vaping Use   Vaping Use: Never used  Substance Use Topics   Alcohol use: Yes    Alcohol/week: 0.0 standard drinks    Comment: rare   Drug use: No      Review of Systems Constitutional: No fever/chills Eyes: No visual changes. ENT: No sore throat. Cardiovascular: Denies chest pain. Respiratory: Denies shortness of breath. Gastrointestinal: No abdominal pain.  No nausea, no vomiting.  No diarrhea.  No constipation. Genitourinary: Negative for dysuria. Musculoskeletal: Negative for back pain. Skin: Negative for rash. Neurological: Negative for headaches, focal weakness or numbness.  Episode of altered mental status now resolved All other ROS negative ____________________________________________   PHYSICAL EXAM:  VITAL SIGNS:.  Blood pressure (!) 154/88, pulse 91, temperature 98.3 F (36.8 C), temperature source Oral, resp. rate 18, height $RemoveBe'5\' 7"'UmtGGbbSW$  (1.702 m), weight 94 kg, SpO2 98 %.   Constitutional: Alert and oriented x3. Well appearing and in no acute distress. Eyes: Conjunctivae are normal. EOMI. Head: Atraumatic. Nose: No congestion/rhinnorhea. Mouth/Throat: Mucous membranes are moist.   Neck: No stridor. Trachea Midline.  FROM Cardiovascular: Normal rate, regular rhythm. Grossly normal heart sounds.  Good peripheral circulation. Respiratory: Normal respiratory effort.  No retractions. Lungs CTAB. Gastrointestinal: Soft and nontender. No distention. No abdominal bruits.  Musculoskeletal: No lower extremity  tenderness nor edema.  No joint effusions. Neurologic:  Normal speech and language. No gross focal neurologic deficits are appreciated.  Moving all extremities well. Skin:  Skin is warm, dry and intact. No rash noted. Psychiatric: Mood and affect are normal. Speech and behavior are normal. GU: Deferred   ____________________________________________   LABS (all labs ordered are listed, but only abnormal results are displayed)  Labs Reviewed  CBC WITH DIFFERENTIAL/PLATELET - Abnormal; Notable for the following components:      Result Value   WBC 3.8 (*)    RBC 2.64 (*)    Hemoglobin 9.1 (*)    HCT 29.1 (*)    MCV 110.2 (*)    MCH 34.5 (*)    RDW 16.5 (*)    Platelets 106 (*)    Lymphs Abs 0.3 (*)    All other components within normal limits  COMPREHENSIVE METABOLIC PANEL - Abnormal; Notable for the following components:   Calcium 8.4 (*)    Albumin 3.4 (*)    All other components within normal limits  CBG MONITORING, ED - Abnormal; Notable for the following components:   Glucose-Capillary 61 (*)    All other components within normal limits  RESP PANEL BY RT-PCR (FLU A&B, COVID) ARPGX2  PROCALCITONIN  CBG MONITORING, ED  CBG MONITORING, ED  CBG MONITORING, ED  CBG MONITORING, ED  CBG MONITORING, ED  CBG MONITORING, ED  CBG MONITORING, ED  CBG MONITORING, ED  CBG MONITORING, ED  CBG MONITORING, ED  CBG MONITORING, ED  TROPONIN I (HIGH SENSITIVITY)  TROPONIN I (HIGH SENSITIVITY)   ____________________________________________   ED ECG REPORT I, Vanessa [place], the attending physician, personally viewed and interpreted this ECG.  Sinus tachycardia rate of 103, no ST elevation, no T  wave versions, occasional PVC there read as atrial fibrillation but I think it is just from his PVCs and patient moving around a lot.  Repeat EKG again is very difficult to see his P waves.  He may be in A. fib although I do not see that he has a history of this.  He is got 1 PVC.  No ST elevation or T wave inversion ____________________________________________  RADIOLOGY Robert Bellow, personally viewed and evaluated these images (plain radiographs) as part of my medical decision making, as well as reviewing the written report by the radiologist.  ED MD interpretation: Streakiness in the lower lobes  Official radiology report(s): DG Chest Portable 1 View  Result Date: 06/29/2021 CLINICAL DATA:  Shortness of breath. EXAM: PORTABLE CHEST 1 VIEW COMPARISON:  Chest radiograph dated 06/25/2021. FINDINGS: There is mild cardiomegaly with mild vascular congestion. Bilateral lower lung field streaky and nodular densities may represent atelectasis or developing infiltrate. No focal consolidation, pleural effusion or pneumothorax. No acute osseous pathology. IMPRESSION: 1. Mild cardiomegaly with mild vascular congestion. 2. Bilateral lower lung field streaky and nodular densities may represent atelectasis or developing infiltrate. Electronically Signed   By: Anner Crete M.D.   On: 06/29/2021 03:07    ____________________________________________   PROCEDURES  Procedure(s) performed (including Critical Care):  Procedures   ____________________________________________   INITIAL IMPRESSION / ASSESSMENT AND PLAN / ED COURSE  Alex Smith was evaluated in Emergency Department on 06/29/2021 for the symptoms described in the history of present illness. He was evaluated in the context of the global COVID-19 pandemic, which necessitated consideration that the patient might be at risk for infection with the SARS-CoV-2 virus that causes COVID-19. Institutional protocols and algorithms that  pertain  to the evaluation of patients at risk for COVID-19 are in a state of rapid change based on information released by regulatory bodies including the CDC and federal and state organizations. These policies and algorithms were followed during the patient's care in the ED.    Patient comes in with episode of unresponsiveness most likely secondary to hypoglycemia.  EKG and cardiac markers ordered to evaluate for ACS, labs to evaluate for Electra abnormalities, AKI.  Patient currently on treatment for UTI therefore we will hold off on treatment specially given that the urine did not really seem to grow anything.  Patient was given 4 juices as well as multiple peanut butter crackers which she tolerated eating  Chest x-ray was concerning for some potential developing infiltrates versus atelectasis.  We will get a repeat COVID, add on a procalcitonin.  Patient is currently not on any oxygen and he is afebrile and denies any shortness of breath or cough so we will hold off on antibiotics given patient was just given a course of Keflex for UTI  EKG is concerning for potential new A. fib but I cannot tell if it is just from some PACs due to really poor baseline.  This can be monitored inpatient  His labs are reassuring however we rechecked another sugar and his sugar had gotten down to 60.  We will give an amp of dextrose and started on a D10 drip.  Given the 2 episodes of hypoglycemia as well as an episode just a few days ago will admit to the hospital for further monitoring to see if he needs his insulin further adjusted. Will do glucose checks every hour until his sugars are more stable.        ____________________________________________   FINAL CLINICAL IMPRESSION(S) / ED DIAGNOSES   Final diagnoses:  Hypoglycemia      MEDICATIONS GIVEN DURING THIS VISIT:  Medications  dextrose 10 % infusion (has no administration in time range)  dextrose 50 % solution 50 mL (50 mLs Intravenous Given  06/29/21 0418)     ED Discharge Orders     None        Note:  This document was prepared using Dragon voice recognition software and may include unintentional dictation errors.    Vanessa [place], MD 06/29/21 [phone]

## 2021-06-30 DIAGNOSIS — E162 Hypoglycemia, unspecified: Secondary | ICD-10-CM | POA: Diagnosis not present

## 2021-06-30 LAB — BASIC METABOLIC PANEL
Anion gap: 5 (ref 5–15)
BUN: 12 mg/dL (ref 8–23)
CO2: 26 mmol/L (ref 22–32)
Calcium: 8.3 mg/dL — ABNORMAL LOW (ref 8.9–10.3)
Chloride: 107 mmol/L (ref 98–111)
Creatinine, Ser: 1.12 mg/dL (ref 0.61–1.24)
GFR, Estimated: >60 mL/min (ref >60)
Glucose, Bld: 141 mg/dL — ABNORMAL HIGH (ref 70–99)
Potassium: 4.2 mmol/L (ref 3.5–5.1)
Sodium: 138 mmol/L (ref 135–145)

## 2021-06-30 LAB — CBC
HCT: 24.1 % — ABNORMAL LOW (ref 39.0–52.0)
Hemoglobin: 7.5 g/dL — ABNORMAL LOW (ref 13.0–17.0)
MCH: 33.5 pg (ref 26.0–34.0)
MCHC: 31.1 g/dL (ref 30.0–36.0)
MCV: 107.6 fL — ABNORMAL HIGH (ref 80.0–100.0)
Platelets: 100 10*3/uL — ABNORMAL LOW (ref 150–400)
RBC: 2.24 MIL/uL — ABNORMAL LOW (ref 4.22–5.81)
RDW: 16.2 % — ABNORMAL HIGH (ref 11.5–15.5)
WBC: 2.4 10*3/uL — ABNORMAL LOW (ref 4.0–10.5)
nRBC: 0 % (ref 0.0–0.2)

## 2021-06-30 LAB — PROCALCITONIN: Procalcitonin: <0.1 ng/mL

## 2021-06-30 LAB — GLUCOSE, CAPILLARY
Glucose-Capillary: 118 mg/dL — ABNORMAL HIGH (ref 70–99)
Glucose-Capillary: 210 mg/dL — ABNORMAL HIGH (ref 70–99)

## 2021-06-30 LAB — MAGNESIUM: Magnesium: 2.4 mg/dL (ref 1.7–2.4)

## 2021-06-30 MED ORDER — AMLODIPINE BESYLATE 5 MG PO TABS
5.0000 mg | ORAL_TABLET | Freq: Every day | ORAL | Status: DC
  Administered 2021-06-30: 5 mg via ORAL
  Filled 2021-06-30: qty 1

## 2021-06-30 NOTE — TOC Progression Note (Signed)
Transition of Care [facility]) - Progression Note    Patient Details  Name: Alex Smith MRN: [id number] Date of Birth: [birth year]  Transition of [facility]) CM/SW [place], LCSW Phone Number: 06/30/2021, 9:40 AM  Clinical Narrative:   Called [facility]. Spoke with Helene Kelp who confirms patient can come back today. She stated the hospital will need to arrange transport. Asked MD if EMS is needed.         Expected Discharge Plan and Services           Expected Discharge Date: 06/30/21                                     Social Determinants of Health (SDOH) Interventions    Readmission Risk Interventions Readmission Risk Prevention Plan 05/11/2021  Transportation Screening Complete  Medication Review Press photographer) Complete  PCP or Specialist appointment within 3-5 days of discharge Complete  HRI or Home Care Consult Complete  SW Recovery Care/Counseling Consult Complete  Palliative Care Screening Complete  Skilled Nursing Facility Complete  Some recent data might be hidden

## 2021-06-30 NOTE — NC FL2 (Signed)
Normal LEVEL OF CARE SCREENING TOOL     IDENTIFICATION  Patient Name: Alex Smith Birthdate: [birth year] Sex: male Admission Date (Current Location): 06/29/2021  [facility] and [place] Number:  Engineering geologist and Address:  [facility], [address] [place], [place] [address]      Provider Number: [id number]  Attending Physician Name and Address:  Enzo Bi, MD  Relative Name and Phone Number:  Daiden, Coltrane [phone])   [phone] [facility])    Current Level of Care: Hospital Recommended Level of Care: [place] Prior Approval Number:    Date Approved/Denied:   PASRR Number:    Discharge Plan:      Current Diagnoses: Patient Active Problem List   Diagnosis Date Noted   Hypoglycemia 06/29/2021   Severe sepsis ([place]) 05/04/2021   CAP (community acquired pneumonia) 05/02/2021   Vitamin D deficiency 03/10/2019   Depressed mood 03/10/2019   CKD stage 3 due to type 2 diabetes mellitus ([place]) 11/14/2017   Goals of care, counseling/discussion 05/27/2017   Pancytopenia due to antineoplastic chemotherapy ([place]) 11/17/2016   Chemotherapy-induced neutropenia ([place]) 09/22/2016   Urinary incontinence 86/76/7209   Systolic murmur 47/04/6282   Myelodysplastic syndrome, low grade ([place]) 02/05/2016   Multiple myeloma not having achieved remission ([place]) 02/05/2016   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   Myelodysplasia present in bone marrow ([place]) 11/03/2015   Advanced care planning/counseling discussion 05/22/2015   Insomnia 03/03/2015   Monoclonal gammopathy 02/03/2015   Macrocytic anemia 02/03/2015   Severe obesity (BMI 35.0-39.9) with comorbidity ([place]) 10/12/2014   History of nonmelanoma skin cancer 10/10/2014   Medicare annual wellness visit, subsequent 04/12/2014   Health maintenance examination 04/12/2014   Hairy cell leukemia, in remission ([place])    B12 deficiency    BPH with obstruction/lower urinary tract symptoms     Essential hypertension    GERD (gastroesophageal reflux disease)    Dyslipidemia    Type 2 diabetes mellitus with hypoglycemia without coma ([place])    Diverticulosis    Macular hole 07/07/2012    Orientation RESPIRATION BLADDER Height & Weight     Self  Normal Incontinent Weight: 207 lb 3.7 oz (94 kg) Height:  5' 7"  (170.2 cm)  BEHAVIORAL SYMPTOMS/MOOD NEUROLOGICAL BOWEL NUTRITION STATUS      Incontinent Diet (heart healthy/carb modified, thin liquids)  AMBULATORY STATUS COMMUNICATION OF NEEDS Skin   Supervision Verbally Other (Comment) (rash)                       Personal Care Assistance Level of Assistance  Bathing, Feeding, Dressing Bathing Assistance: Limited assistance Feeding assistance: Limited assistance Dressing Assistance: Limited assistance     Functional Limitations Info             SPECIAL CARE FACTORS FREQUENCY                       Contractures      Additional Factors Info  Code Status, Allergies Code Status Info: full code Allergies Info: Rituximab, Blood-group Specific Substance, Primaxin (Imipenem), Voriconazole, Sulfa Antibiotics, Sulfacetamide Sodium           Current Medications (06/30/2021):   Medication List       STOP taking these medications     glucose blood test strip Commonly known as: Accu-Chek Aviva Plus    HumuLIN N 100 UNIT/ML injection Generic drug: insulin NPH Human    HumuLIN R 100 units/mL injection Generic drug: insulin regular  Microlet Lancets Misc    omeprazole 20 MG capsule Commonly known as: PRILOSEC    QUEtiapine 50 MG tablet Commonly known as: SEROQUEL    traZODone 50 MG tablet Commonly known as: DESYREL    UltiCare Insulin Syringe 31G X 5/16" 0.5 ML Misc Generic drug: Insulin Syringe-Needle U-100           TAKE these medications     amLODipine 5 MG tablet Commonly known as: NORVASC Take 1 tablet (5 mg total) by mouth daily.    aspirin EC 81 MG tablet Take 81 mg by mouth  daily.    cephALEXin 500 MG capsule Commonly known as: KEFLEX Take 1 capsule (500 mg total) by mouth 3 (three) times daily for 7 days.    doxazosin 1 MG tablet Commonly known as: CARDURA TAKE ONE TABLET BY MOUTH EVERY DAY    famotidine 40 MG tablet Commonly known as: PEPCID Take 0.5 tablets (20 mg total) by mouth daily. NEEDS OFFICE VISIT    lisinopril 20 MG tablet Commonly known as: ZESTRIL TAKE ONE TABLET BY MOUTH TWICE DAILY    NON FORMULARY Apply 1 application topically as needed. AMITIP5% / GABA10% / LIDO5% Apply topically to arms, abdomen and legs as needed. *Provided by patient's family*    pravastatin 20 MG tablet Commonly known as: PRAVACHOL TAKE ONE TABLET EVERY EVENING    tamsulosin 0.4 MG Caps capsule Commonly known as: FLOMAX TAKE 1 CAPSULE BY MOUTH EVERY DAY    vitamin B-12 1000 MCG tablet Commonly known as: CYANOCOBALAMIN Take 1 tablet (1,000 mcg total) by mouth daily.    vitamin E 180 MG (400 UNITS) capsule Take 200 Units by mouth daily.      Relevant Imaging Results:  Relevant Lab Results:   Additional Information SS #: [id number]  [place], LCSW

## 2021-06-30 NOTE — Discharge Summary (Signed)
Physician Discharge Summary   Alex Smith  male DOB: August 11, 1935  NOI:[id number]  PCP: Alex Ramsay, MD  Admit date: 06/29/2021 Discharge date: 06/30/2021  Admitted From: ALF Disposition:  ALF Son updated at bedside prior to discharge.  CODE STATUS: Full code  Discharge Instructions     Discharge instructions   Complete by: As directed    You have had multiple presentations recently for low blood sugar, this last time EMS found your blood sugar in 20's.  Your blood sugar level has remained between 100-150 during your hospitalization without needing any insulin.  Your A1c has been trending down, currently at 4.9.  You don't need to take insulin anymore currently.   Dr. Enzo [facility] Course:  For full details, please see H&P, progress notes, consult notes and ancillary notes.  Briefly,  Alex Smith is a 86 y.o. male with medical history significant for HTN, smoldering myeloma on chemotherapy, pancytopenia related to chemo, insulin-dependent type 2 diabetes, seen in the ED 4 days prior with hypoglycemia, discharged on antibiotics for UTI, who presented from ALF after he was found with decreased responsiveness with last known well a few hours prior.    EMS found him to a blood sugar in the 20s and administered to 50 mL of D10 on their arrival and also placed him on NRB for O2 sat of 87% on room air.  Patient became more responsive with EMS intervention and was awake alert and oriented by arrival in the ED.  Patient has little recollection of the events but states he was previously feeling his usual self when he went to bed.   Recurrent hypoglycemia due to too much insulin Type 2 diabetes, no longer active -Suspect due to too much insulin.  Home med listed Humulin N 35u BID and Humulin R 5u BID.   --blood sugar level has remained between 100-150 during hospitalization without needing any insulin.  A1c has been trending down, currently at 4.9.   --Pt  reported weight loss of at least 25 lbs and eating less, so likely no longer need insulin.  All insulin d/c'ed at discharge.  Pt will follow up with PCP to check A1c in 3 months.  With pt's age, it's safer to have slightly higher BG than low BG.  Acute metabolic encephalopathy secondary to hypoglycemia - Resolved by admission - Management as above     Pancytopenia due to antineoplastic chemotherapy ([place]) Smoldering myeloma - Stable for the past 1 month - Patient follows with hematologist Dr. Janese Smith     Essential hypertension -Controlled --cont home Lisinopril and amlodipine  BPH --cont home Cardura and Flomax   HLD --resume home statin after discharge   Discharge Diagnoses:  Principal Problem:   Type 2 diabetes mellitus with hypoglycemia without coma ([name]) Active Problems:   Essential hypertension   Smoldering myeloma   Pancytopenia due to antineoplastic chemotherapy ([name])   Hypoglycemia   30 Day Unplanned Readmission Risk Score    Flowsheet Row ED to Hosp-Admission (Discharged) from 04/30/2021 in [place]  30 Day Unplanned Readmission Risk Score (%) 21.44 Filed at 05/11/2021 1200       This score is the patient's risk of an unplanned readmission within 30 days of being discharged (0 -100%). The score is based on dignosis, age, lab data, medications, orders, and past utilization.   Low:  0-14.9   Medium: 15-21.9   High: 22-29.9  Extreme: 30 and above         Discharge Instructions:  Allergies as of 06/30/2021       Reactions   Rituximab Rash   Chest tightness Chest tightness   Blood-group Specific Substance Other (See Comments)   Had a post transfusion reaction of red blood cells; NOW REQUIRES WASHED BLOOD CELLS Had a post transfusion reaction of red blood cells; NOW REQUIRES WASHED BLOOD CELLS   Primaxin [imipenem] Other (See Comments)   Possible allergy Possible allergy   Voriconazole Other (See Comments)   Sulfa  Antibiotics Itching, Rash   Sulfacetamide Sodium Itching, Rash        Medication List     STOP taking these medications    glucose blood test strip Commonly known as: Accu-Chek Aviva Plus   HumuLIN N 100 UNIT/ML injection Generic drug: insulin NPH Human   HumuLIN R 100 units/mL injection Generic drug: insulin regular   Microlet Lancets Misc   omeprazole 20 MG capsule Commonly known as: PRILOSEC   QUEtiapine 50 MG tablet Commonly known as: SEROQUEL   traZODone 50 MG tablet Commonly known as: DESYREL   UltiCare Insulin Syringe 31G X 5/16" 0.5 ML Misc Generic drug: Insulin Syringe-Needle U-100       TAKE these medications    amLODipine 5 MG tablet Commonly known as: NORVASC Take 1 tablet (5 mg total) by mouth daily.   aspirin EC 81 MG tablet Take 81 mg by mouth daily.   cephALEXin 500 MG capsule Commonly known as: KEFLEX Take 1 capsule (500 mg total) by mouth 3 (three) times daily for 7 days.   doxazosin 1 MG tablet Commonly known as: CARDURA TAKE ONE TABLET BY MOUTH EVERY DAY   famotidine 40 MG tablet Commonly known as: PEPCID Take 0.5 tablets (20 mg total) by mouth daily. NEEDS OFFICE VISIT   lisinopril 20 MG tablet Commonly known as: ZESTRIL TAKE ONE TABLET BY MOUTH TWICE DAILY   NON FORMULARY Apply 1 application topically as needed. AMITIP5% / GABA10% / LIDO5% Apply topically to arms, abdomen and legs as needed. *Provided by patient's family*   pravastatin 20 MG tablet Commonly known as: PRAVACHOL TAKE ONE TABLET EVERY EVENING   tamsulosin 0.4 MG Caps capsule Commonly known as: FLOMAX TAKE 1 CAPSULE BY MOUTH EVERY DAY   vitamin B-12 1000 MCG tablet Commonly known as: CYANOCOBALAMIN Take 1 tablet (1,000 mcg total) by mouth daily.   vitamin E 180 MG (400 UNITS) capsule Take 200 Units by mouth daily.         Follow-up Information     Alex Ramsay, MD Follow up in 1 week(s).   Specialty: Infectious Diseases Contact  information: [place] [address] [phone]                 Allergies  Allergen Reactions   Rituximab Rash    Chest tightness Chest tightness   Blood-Group Specific Substance Other (See Comments)    Had a post transfusion reaction of red blood cells; NOW REQUIRES WASHED BLOOD CELLS Had a post transfusion reaction of red blood cells; NOW REQUIRES WASHED BLOOD CELLS   Primaxin [Imipenem] Other (See Comments)    Possible allergy Possible allergy   Voriconazole Other (See Comments)   Sulfa Antibiotics Itching and Rash   Sulfacetamide Sodium Itching and Rash     The results of significant diagnostics from this hospitalization (including imaging, microbiology, ancillary and laboratory) are listed below for reference.   Consultations:  Procedures/Studies: DG Chest Portable 1 View  Result Date: 06/29/2021 CLINICAL DATA:  Shortness of breath. EXAM: PORTABLE CHEST 1 VIEW COMPARISON:  Chest radiograph dated 06/25/2021. FINDINGS: There is mild cardiomegaly with mild vascular congestion. Bilateral lower lung field streaky and nodular densities may represent atelectasis or developing infiltrate. No focal consolidation, pleural effusion or pneumothorax. No acute osseous pathology. IMPRESSION: 1. Mild cardiomegaly with mild vascular congestion. 2. Bilateral lower lung field streaky and nodular densities may represent atelectasis or developing infiltrate. Electronically Signed   By: Anner Crete M.D.   On: 06/29/2021 03:07   DG Chest Portable 1 View  Result Date: 06/25/2021 CLINICAL DATA:  Altered mental status EXAM: PORTABLE CHEST 1 VIEW COMPARISON:  05/23/2021 FINDINGS: Cardiac shadow is enlarged but stable. The lungs are well aerated bilaterally. No focal infiltrate or effusion is seen. Previously seen opacities in the left upper lobe are no longer identified. No new focal abnormality is seen. IMPRESSION: No acute abnormality noted. Electronically Signed    By: Inez Catalina M.D.   On: 06/25/2021 01:18      Labs: BNP (last 3 results) No results for input(s): BNP in the last 8760 hours. Basic Metabolic Panel: Recent Labs  Lab 06/25/21 0041 06/29/21 0257 06/30/21 0710  NA 139 138 138  K 3.4* 4.0 4.2  CL 110 109 107  CO2 23 23 26   GLUCOSE 66* 78 141*  BUN 17 15 12   CREATININE 1.27* 1.11 1.12  CALCIUM 8.2* 8.4* 8.3*  MG  --   --  2.4   Liver Function Tests: Recent Labs  Lab 06/25/21 0041 06/29/21 0257  AST 13* 17  ALT 10 9  ALKPHOS 75 83  BILITOT 0.9 0.8  PROT 6.7 7.3  ALBUMIN 3.2* 3.4*   No results for input(s): LIPASE, AMYLASE in the last 168 hours. No results for input(s): AMMONIA in the last 168 hours. CBC: Recent Labs  Lab 06/25/21 0041 06/29/21 0257 06/30/21 0710  WBC 2.5* 3.8* 2.4*  NEUTROABS 2.1 3.2  --   HGB 8.0* 9.1* 7.5*  HCT 25.3* 29.1* 24.1*  MCV 109.5* 110.2* 107.6*  PLT 100* 106* 100*   Cardiac Enzymes: No results for input(s): CKTOTAL, CKMB, CKMBINDEX, TROPONINI in the last 168 hours. BNP: Invalid input(s): POCBNP CBG: Recent Labs  Lab 06/29/21 0959 06/29/21 1138 06/29/21 1607 06/29/21 2111 06/30/21 0818  GLUCAP 109* 150* 138* 152* 118*   D-Dimer No results for input(s): DDIMER in the last 72 hours. Hgb A1c Recent Labs    06/29/21 0257  HGBA1C 4.9   Lipid Profile No results for input(s): CHOL, HDL, LDLCALC, TRIG, CHOLHDL, LDLDIRECT in the last 72 hours. Thyroid function studies No results for input(s): TSH, T4TOTAL, T3FREE, THYROIDAB in the last 72 hours.  Invalid input(s): FREET3 Anemia work up No results for input(s): VITAMINB12, FOLATE, FERRITIN, TIBC, IRON, RETICCTPCT in the last 72 hours. Urinalysis    Component Value Date/Time   COLORURINE YELLOW (A) 06/25/2021 0541   APPEARANCEUR CLEAR (A) 06/25/2021 0541   LABSPEC 1.014 06/25/2021 0541   PHURINE 5.0 06/25/2021 0541   GLUCOSEU 50 (A) 06/25/2021 0541   HGBUR NEGATIVE 06/25/2021 0541   BILIRUBINUR NEGATIVE  06/25/2021 0541   BILIRUBINUR Negative 08/02/2016 1212   KETONESUR NEGATIVE 06/25/2021 0541   PROTEINUR NEGATIVE 06/25/2021 0541   UROBILINOGEN 0.2 08/02/2016 1212   NITRITE NEGATIVE 06/25/2021 0541   LEUKOCYTESUR MODERATE (A) 06/25/2021 0541   Sepsis Labs Invalid input(s): PROCALCITONIN,  WBC,  LACTICIDVEN Microbiology Recent Results (from the past 240 hour(s))  Resp Panel by RT-PCR (Flu A&B, Covid) Nasopharyngeal Swab     Status: None   Collection Time: 06/25/21 12:41 AM   Specimen: Nasopharyngeal Swab; Nasopharyngeal(NP) swabs in vial transport medium  Result Value Ref Range Status   SARS Coronavirus 2 by RT PCR NEGATIVE NEGATIVE Final    Comment: (NOTE) SARS-CoV-2 target nucleic acids are NOT DETECTED.  The SARS-CoV-2 RNA is generally detectable in upper respiratory specimens during the acute phase of infection. The lowest concentration of SARS-CoV-2 viral copies this assay can detect is 138 copies/mL. A negative result does not preclude SARS-Cov-2 infection and should not be used as the sole basis for treatment or other patient management decisions. A negative result may occur with  improper specimen collection/handling, submission of specimen other than nasopharyngeal swab, presence of viral mutation(s) within the areas targeted by this assay, and inadequate number of viral copies(<138 copies/mL). A negative result must be combined with clinical observations, patient history, and epidemiological information. The expected result is Negative.  Fact Sheet for Patients:  EntrepreneurPulse.com.au  Fact Sheet for Healthcare Providers:  IncredibleEmployment.be  This test is no t yet approved or cleared by the [place] FDA and  has been authorized for detection and/or diagnosis of SARS-CoV-2 by FDA under an Emergency Use Authorization (EUA). This EUA will remain  in effect (meaning this test can be used) for the duration of the COVID-19  declaration under Section 564(b)(1) of the Act, 21 U.S.C.section 360bbb-3(b)(1), unless the authorization is terminated  or revoked sooner.       Influenza A by PCR NEGATIVE NEGATIVE Final   Influenza B by PCR NEGATIVE NEGATIVE Final    Comment: (NOTE) The Xpert Xpress SARS-CoV-2/FLU/RSV plus assay is intended as an aid in the diagnosis of influenza from Nasopharyngeal swab specimens and should not be used as a sole basis for treatment. Nasal washings and aspirates are unacceptable for Xpert Xpress SARS-CoV-2/FLU/RSV testing.  Fact Sheet for Patients: EntrepreneurPulse.com.au  Fact Sheet for Healthcare Providers: IncredibleEmployment.be  This test is not yet approved or cleared by the [place] FDA and has been authorized for detection and/or diagnosis of SARS-CoV-2 by FDA under an Emergency Use Authorization (EUA). This EUA will remain in effect (meaning this test can be used) for the duration of the COVID-19 declaration under Section 564(b)(1) of the Act, 21 U.S.C. section 360bbb-3(b)(1), unless the authorization is terminated or revoked.  Performed at [facility], [address], [place], [place] [address]   Urine Culture     Status: Abnormal   Collection Time: 06/25/21  5:41 AM   Specimen: Urine, Clean Catch  Result Value Ref Range Status   Specimen Description   Final    URINE, CLEAN CATCH Performed at [facility], [address] [place], [place] [address]    Special Requests   Final    NONE Performed at [facility], [address], [place], [place] [address]    Culture (A)  Final    <10,000 COLONIES/mL INSIGNIFICANT GROWTH Performed at [facility] [address], [place], [place] [address]    Report Status 06/26/2021 FINAL  Final  Resp Panel by RT-PCR (Flu A&B, Covid) Nasopharyngeal Swab     Status: None   Collection Time: 06/29/21  4:13 AM   Specimen: Nasopharyngeal Swab;  Nasopharyngeal(NP) swabs in vial transport medium  Result Value Ref Range Status   SARS Coronavirus 2 by RT PCR NEGATIVE NEGATIVE Final    Comment: (NOTE) SARS-CoV-2 target nucleic acids are  NOT DETECTED.  The SARS-CoV-2 RNA is generally detectable in upper respiratory specimens during the acute phase of infection. The lowest concentration of SARS-CoV-2 viral copies this assay can detect is 138 copies/mL. A negative result does not preclude SARS-Cov-2 infection and should not be used as the sole basis for treatment or other patient management decisions. A negative result may occur with  improper specimen collection/handling, submission of specimen other than nasopharyngeal swab, presence of viral mutation(s) within the areas targeted by this assay, and inadequate number of viral copies(<138 copies/mL). A negative result must be combined with clinical observations, patient history, and epidemiological information. The expected result is Negative.  Fact Sheet for Patients:  EntrepreneurPulse.com.au  Fact Sheet for Healthcare Providers:  IncredibleEmployment.be  This test is no t yet approved or cleared by the [place] FDA and  has been authorized for detection and/or diagnosis of SARS-CoV-2 by FDA under an Emergency Use Authorization (EUA). This EUA will remain  in effect (meaning this test can be used) for the duration of the COVID-19 declaration under Section 564(b)(1) of the Act, 21 U.S.C.section 360bbb-3(b)(1), unless the authorization is terminated  or revoked sooner.       Influenza A by PCR NEGATIVE NEGATIVE Final   Influenza B by PCR NEGATIVE NEGATIVE Final    Comment: (NOTE) The Xpert Xpress SARS-CoV-2/FLU/RSV plus assay is intended as an aid in the diagnosis of influenza from Nasopharyngeal swab specimens and should not be used as a sole basis for treatment. Nasal washings and aspirates are unacceptable for Xpert Xpress  SARS-CoV-2/FLU/RSV testing.  Fact Sheet for Patients: EntrepreneurPulse.com.au  Fact Sheet for Healthcare Providers: IncredibleEmployment.be  This test is not yet approved or cleared by the [place] FDA and has been authorized for detection and/or diagnosis of SARS-CoV-2 by FDA under an Emergency Use Authorization (EUA). This EUA will remain in effect (meaning this test can be used) for the duration of the COVID-19 declaration under Section 564(b)(1) of the Act, 21 U.S.C. section 360bbb-3(b)(1), unless the authorization is terminated or revoked.  Performed at [facility], [address] [place], [place] [address]      Total time spend on discharging this patient, including the last patient exam, discussing the hospital stay, instructions for ongoing care as it relates to all pertinent caregivers, as well as preparing the medical discharge records, prescriptions, and/or referrals as applicable, is 35 minutes.    Alex Bi, MD  [facility] 06/30/2021, 9:21 AM

## 2021-06-30 NOTE — Plan of Care (Signed)

## 2021-06-30 NOTE — Progress Notes (Signed)
AVS and social worker's paperwork placed in discharge packet. [facility] to transport pt with all belongings.

## 2021-06-30 NOTE — TOC Transition Note (Signed)
Transition of Care [facility]) - CM/SW Discharge Note   Patient Details  Name: Alex Smith MRN: [id number] Date of Birth: Sep 30, 1934  Transition of Care [facility]) CM/SW Contact:  Magnus Ivan, LCSW Phone Number: 06/30/2021, 2:36 PM   Clinical Narrative:   Discharge to [facility] today.  Confirmed with [facility].  Updated MD, RN, and patient's son Ashden.  FL2 with DC Meds and EMS Paperwork completed and placed in [place]. EMS arranged through [facility].     Final next level of care: Assisted Living Barriers to Discharge: Barriers Resolved   Patient Goals and CMS Choice Patient states their goals for this hospitalization and ongoing recovery are:: ALF CMS Medicare.gov Compare Post Acute Care list provided to:: Patient Represenative (must comment) Choice offered to / list presented to : Adult Children  Discharge Placement                Patient to be transferred to facility by: [facility] Name of family member notified: Jeneen Rinks Patient and family notified of of transfer: 06/30/21  Discharge Plan and Services                                     Social Determinants of Health (SDOH) Interventions     Readmission Risk Interventions Readmission Risk Prevention Plan 05/11/2021  Transportation Screening Complete  Medication Review [employer]) Complete  PCP or Specialist appointment within 3-5 days of discharge Complete  HRI or Home Care Consult Complete  SW Recovery Care/Counseling Consult Complete  Palliative Care Screening Complete  Skilled Nursing Facility Complete  Some recent data might be hidden

## 2021-07-02 ENCOUNTER — Telehealth: Payer: Self-pay | Admitting: Nurse Practitioner

## 2021-07-02 NOTE — Telephone Encounter (Signed)
Daughter called to cancel pt's appt.Please call back to reschedule at [place]

## 2021-07-06 ENCOUNTER — Ambulatory Visit: Payer: Medicare Other

## 2021-07-06 ENCOUNTER — Other Ambulatory Visit: Payer: Medicare Other

## 2021-07-07 ENCOUNTER — Encounter: Payer: Self-pay | Admitting: Dermatology

## 2021-07-18 ENCOUNTER — Other Ambulatory Visit: Payer: Self-pay | Admitting: *Deleted

## 2021-07-18 DIAGNOSIS — D46Z Other myelodysplastic syndromes: Secondary | ICD-10-CM

## 2021-07-20 ENCOUNTER — Inpatient Hospital Stay: Payer: Medicare Other | Attending: Nurse Practitioner

## 2021-07-20 ENCOUNTER — Other Ambulatory Visit: Payer: Self-pay

## 2021-07-20 ENCOUNTER — Other Ambulatory Visit: Payer: Self-pay | Admitting: *Deleted

## 2021-07-20 ENCOUNTER — Inpatient Hospital Stay: Payer: Medicare Other

## 2021-07-20 DIAGNOSIS — D46Z Other myelodysplastic syndromes: Secondary | ICD-10-CM | POA: Insufficient documentation

## 2021-07-20 DIAGNOSIS — D472 Monoclonal gammopathy: Secondary | ICD-10-CM

## 2021-07-20 LAB — CBC WITH DIFFERENTIAL/PLATELET
Abs Immature Granulocytes: 0 10*3/uL (ref 0.00–0.07)
Basophils Absolute: 0 10*3/uL (ref 0.0–0.1)
Basophils Relative: 0 %
Eosinophils Absolute: 0.1 10*3/uL (ref 0.0–0.5)
Eosinophils Relative: 3 %
HCT: 24.9 % — ABNORMAL LOW (ref 39.0–52.0)
Hemoglobin: 7.9 g/dL — ABNORMAL LOW (ref 13.0–17.0)
Immature Granulocytes: 0 %
Lymphocytes Relative: 32 %
Lymphs Abs: 0.9 10*3/uL (ref 0.7–4.0)
MCH: 34.3 pg — ABNORMAL HIGH (ref 26.0–34.0)
MCHC: 31.7 g/dL (ref 30.0–36.0)
MCV: 108.3 fL — ABNORMAL HIGH (ref 80.0–100.0)
Monocytes Absolute: 0.2 10*3/uL (ref 0.1–1.0)
Monocytes Relative: 6 %
Neutro Abs: 1.7 10*3/uL (ref 1.7–7.7)
Neutrophils Relative %: 59 %
Platelets: 96 10*3/uL — ABNORMAL LOW (ref 150–400)
RBC: 2.3 MIL/uL — ABNORMAL LOW (ref 4.22–5.81)
RDW: 16 % — ABNORMAL HIGH (ref 11.5–15.5)
WBC: 2.9 10*3/uL — ABNORMAL LOW (ref 4.0–10.5)
nRBC: 0 % (ref 0.0–0.2)

## 2021-07-20 MED ORDER — LUSPATERCEPT-AAMT 75 MG ~~LOC~~ SOLR
100.0000 mg | SUBCUTANEOUS | Status: DC
  Administered 2021-07-20: 100 mg via SUBCUTANEOUS
  Filled 2021-07-20: qty 1.5

## 2021-08-09 ENCOUNTER — Emergency Department: Payer: Medicare Other

## 2021-08-09 ENCOUNTER — Other Ambulatory Visit: Payer: Self-pay

## 2021-08-09 ENCOUNTER — Emergency Department
Admission: EM | Admit: 2021-08-09 | Discharge: 2021-08-09 | Disposition: A | Discharge status: Home / Self Care | Payer: Medicare Other | Attending: Emergency Medicine | Admitting: Emergency Medicine

## 2021-08-09 DIAGNOSIS — S32010A Wedge compression fracture of first lumbar vertebra, initial encounter for closed fracture: Secondary | ICD-10-CM | POA: Insufficient documentation

## 2021-08-09 DIAGNOSIS — S0101XA Laceration without foreign body of scalp, initial encounter: Secondary | ICD-10-CM | POA: Insufficient documentation

## 2021-08-09 DIAGNOSIS — N183 Chronic kidney disease, stage 3 unspecified: Secondary | ICD-10-CM | POA: Insufficient documentation

## 2021-08-09 DIAGNOSIS — Z20822 Contact with and (suspected) exposure to covid-19: Secondary | ICD-10-CM | POA: Insufficient documentation

## 2021-08-09 DIAGNOSIS — E1122 Type 2 diabetes mellitus with diabetic chronic kidney disease: Secondary | ICD-10-CM | POA: Diagnosis not present

## 2021-08-09 DIAGNOSIS — S22010A Wedge compression fracture of first thoracic vertebra, initial encounter for closed fracture: Secondary | ICD-10-CM | POA: Insufficient documentation

## 2021-08-09 DIAGNOSIS — Z79899 Other long term (current) drug therapy: Secondary | ICD-10-CM | POA: Insufficient documentation

## 2021-08-09 DIAGNOSIS — W01198A Fall on same level from slipping, tripping and stumbling with subsequent striking against other object, initial encounter: Secondary | ICD-10-CM | POA: Diagnosis not present

## 2021-08-09 DIAGNOSIS — S22000A Wedge compression fracture of unspecified thoracic vertebra, initial encounter for closed fracture: Secondary | ICD-10-CM

## 2021-08-09 DIAGNOSIS — Z87891 Personal history of nicotine dependence: Secondary | ICD-10-CM | POA: Diagnosis not present

## 2021-08-09 DIAGNOSIS — Z85828 Personal history of other malignant neoplasm of skin: Secondary | ICD-10-CM | POA: Diagnosis not present

## 2021-08-09 DIAGNOSIS — S7002XA Contusion of left hip, initial encounter: Secondary | ICD-10-CM | POA: Insufficient documentation

## 2021-08-09 DIAGNOSIS — W19XXXA Unspecified fall, initial encounter: Secondary | ICD-10-CM

## 2021-08-09 DIAGNOSIS — S0003XA Contusion of scalp, initial encounter: Secondary | ICD-10-CM

## 2021-08-09 DIAGNOSIS — Z7982 Long term (current) use of aspirin: Secondary | ICD-10-CM | POA: Diagnosis not present

## 2021-08-09 DIAGNOSIS — R42 Dizziness and giddiness: Secondary | ICD-10-CM

## 2021-08-09 DIAGNOSIS — I129 Hypertensive chronic kidney disease with stage 1 through stage 4 chronic kidney disease, or unspecified chronic kidney disease: Secondary | ICD-10-CM | POA: Diagnosis not present

## 2021-08-09 DIAGNOSIS — S51012A Laceration without foreign body of left elbow, initial encounter: Secondary | ICD-10-CM | POA: Insufficient documentation

## 2021-08-09 DIAGNOSIS — S34101A Unspecified injury to L1 level of lumbar spinal cord, initial encounter: Secondary | ICD-10-CM | POA: Diagnosis present

## 2021-08-09 LAB — BASIC METABOLIC PANEL
Anion gap: 4 — ABNORMAL LOW (ref 5–15)
BUN: 12 mg/dL (ref 8–23)
CO2: 26 mmol/L (ref 22–32)
Calcium: 8.7 mg/dL — ABNORMAL LOW (ref 8.9–10.3)
Chloride: 105 mmol/L (ref 98–111)
Creatinine, Ser: 1.21 mg/dL (ref 0.61–1.24)
GFR, Estimated: 59 mL/min — ABNORMAL LOW (ref >60)
Glucose, Bld: 258 mg/dL — ABNORMAL HIGH (ref 70–99)
Potassium: 3.9 mmol/L (ref 3.5–5.1)
Sodium: 135 mmol/L (ref 135–145)

## 2021-08-09 LAB — CBC
HCT: 27.5 % — ABNORMAL LOW (ref 39.0–52.0)
Hemoglobin: 9.1 g/dL — ABNORMAL LOW (ref 13.0–17.0)
MCH: 34.5 pg — ABNORMAL HIGH (ref 26.0–34.0)
MCHC: 33.1 g/dL (ref 30.0–36.0)
MCV: 104.2 fL — ABNORMAL HIGH (ref 80.0–100.0)
Platelets: 104 10*3/uL — ABNORMAL LOW (ref 150–400)
RBC: 2.64 MIL/uL — ABNORMAL LOW (ref 4.22–5.81)
RDW: 14.9 % (ref 11.5–15.5)
WBC: 3.1 10*3/uL — ABNORMAL LOW (ref 4.0–10.5)
nRBC: 0 % (ref 0.0–0.2)

## 2021-08-09 LAB — TROPONIN I (HIGH SENSITIVITY): Troponin I (High Sensitivity): 10 ng/L (ref <18)

## 2021-08-09 IMAGING — CT CT L SPINE W/O CM
3 of 5 series · 12 of 33 positions shown, 13 images · non-contrast
Comparison: None.

CLINICAL DATA: Back trauma. Patient got dizzy and fell this
morning.

EXAM:
CT LUMBAR SPINE WITHOUT CONTRAST
TECHNIQUE: Multidetector CT imaging of the lumbar spine was performed without
intravenous contrast administration. Multiplanar CT image
reconstructions were also generated.

[Series 3: multi disc · axial · 0.21mm/px · z∈[-442,-259]mm · 4 of 114 slices shown, 5 images]
[im 19/114  soft-tissue]
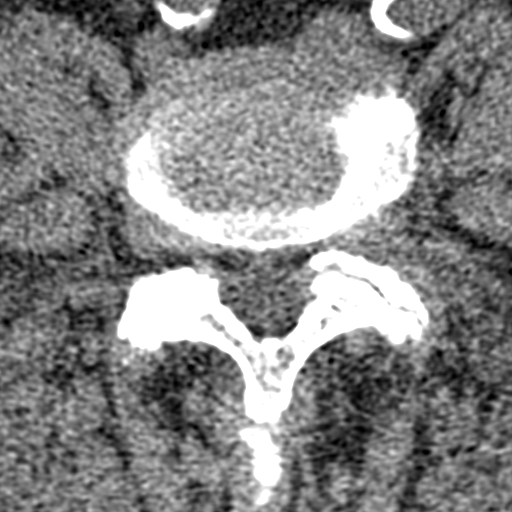
[im 19/114  bone]
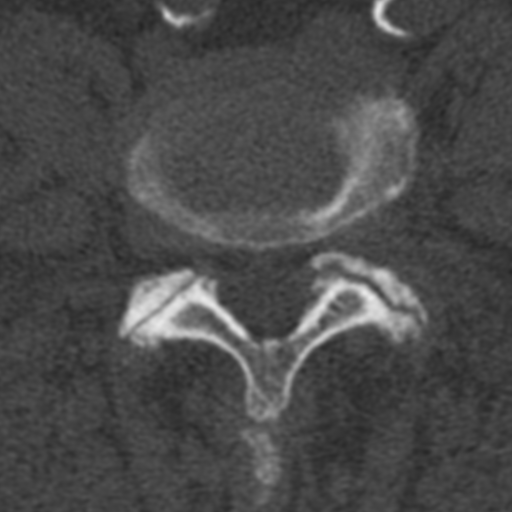
[im 38/114  bone]
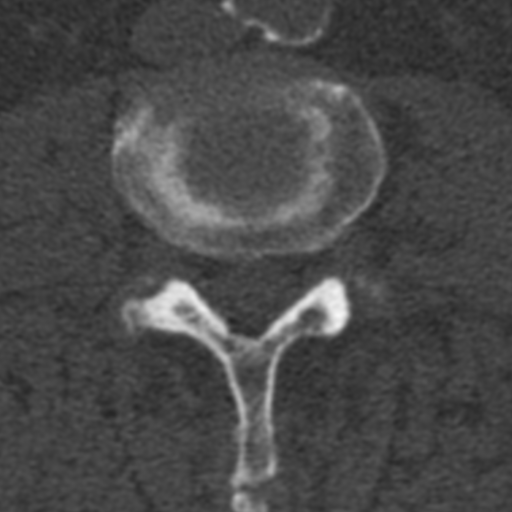
[im 76/114  bone]
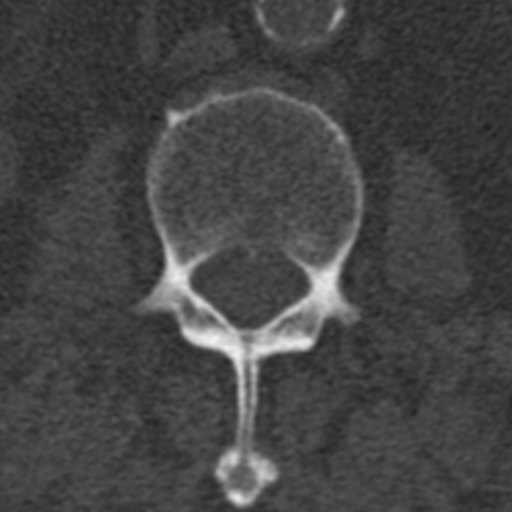
[im 95/114  bone]
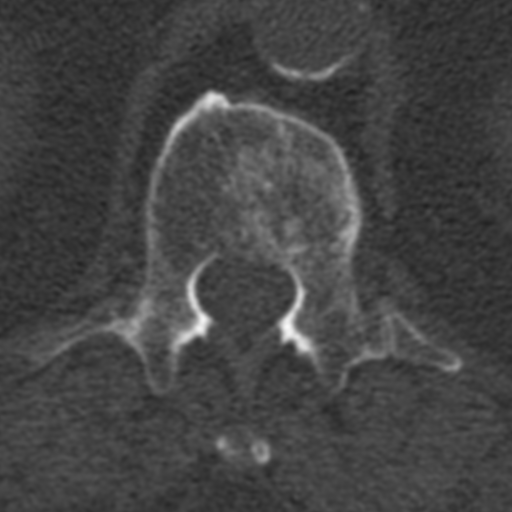

[Series 6: sagittal bone · sagittal · 0.29mm/px · 5 of 55 slices shown]
[im 10/55  bone]
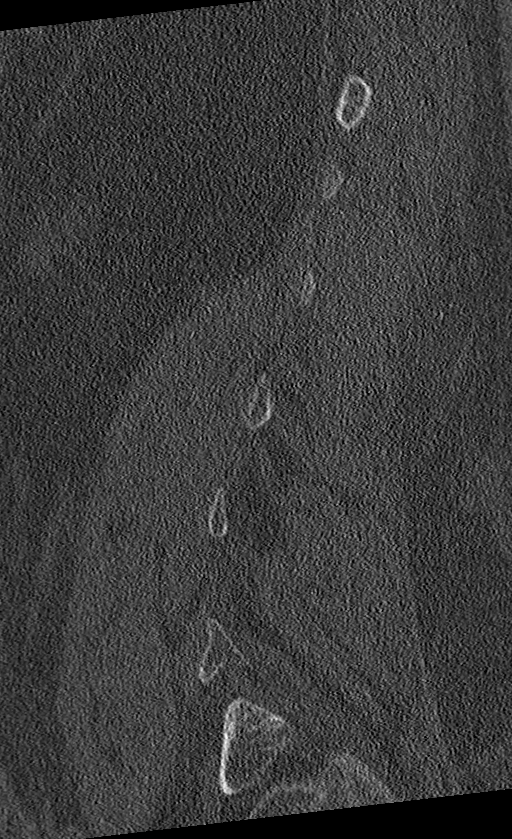
[im 19/55  bone]
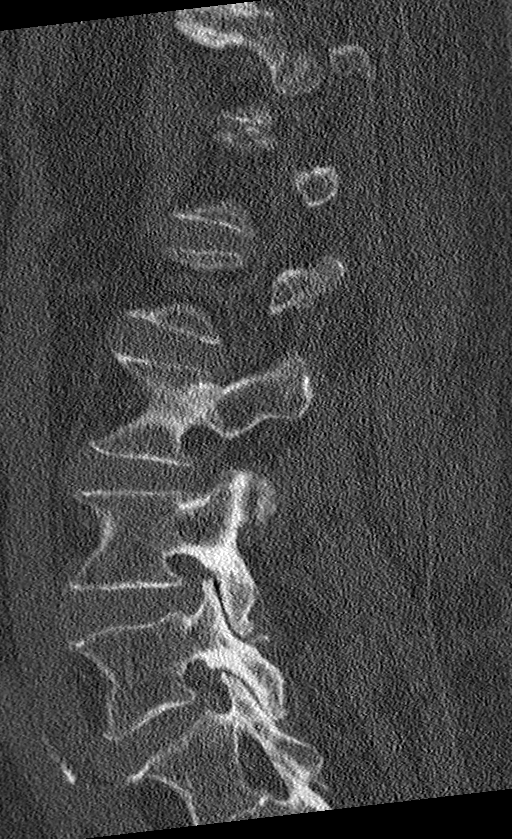
[im 28/55  bone]
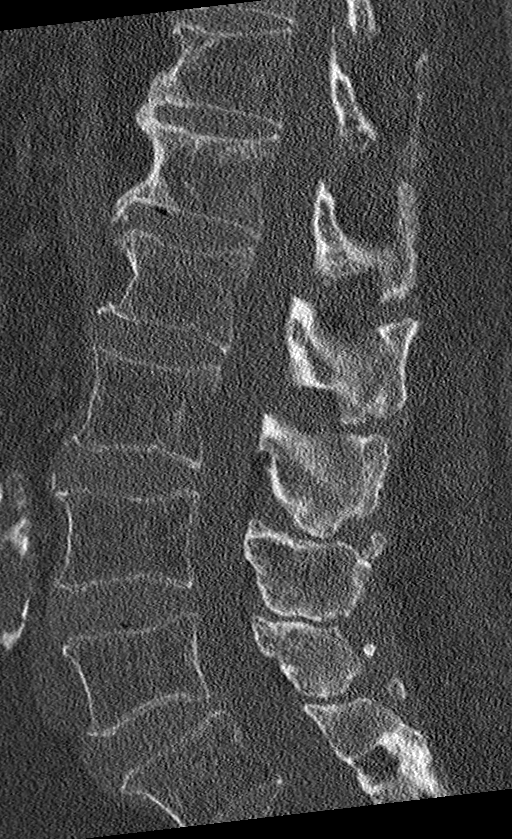
[im 37/55  bone]
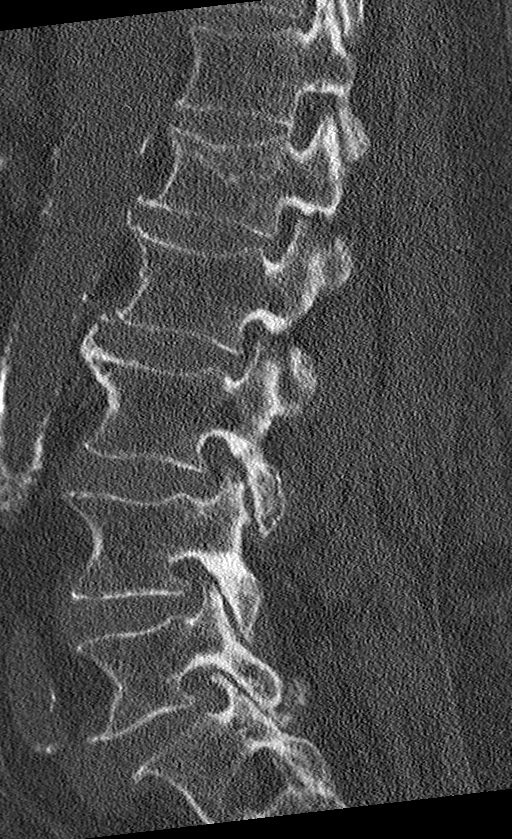
[im 46/55  bone]
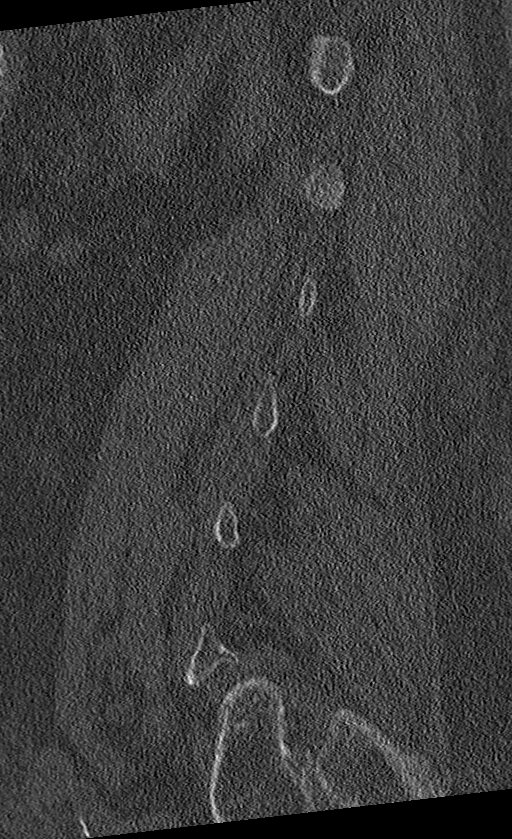

[Series 8: coronal bone · coronal · 0.21mm/px · 3 of 72 slices shown]
[im 15/72  bone]
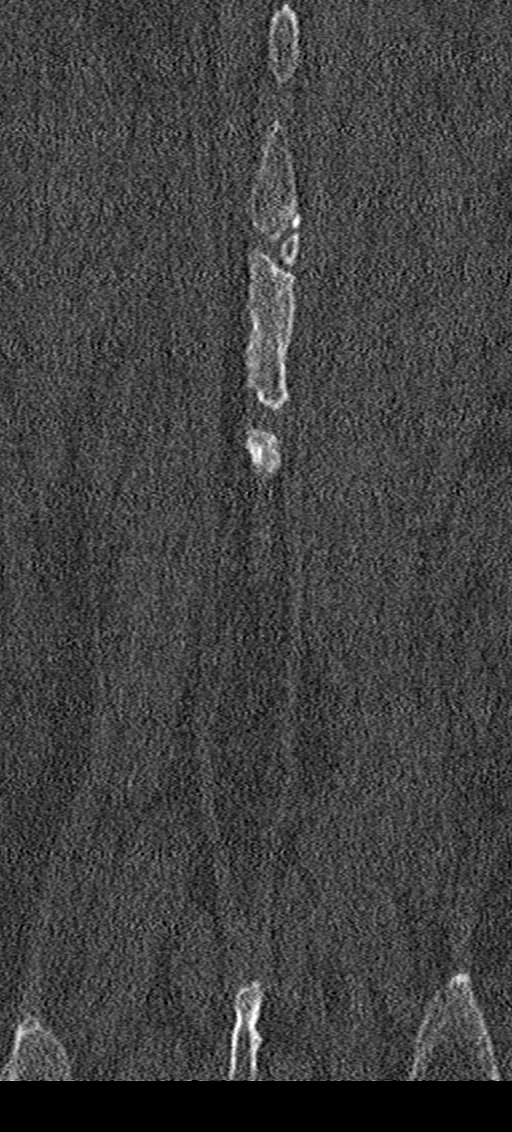
[im 29/72  bone]
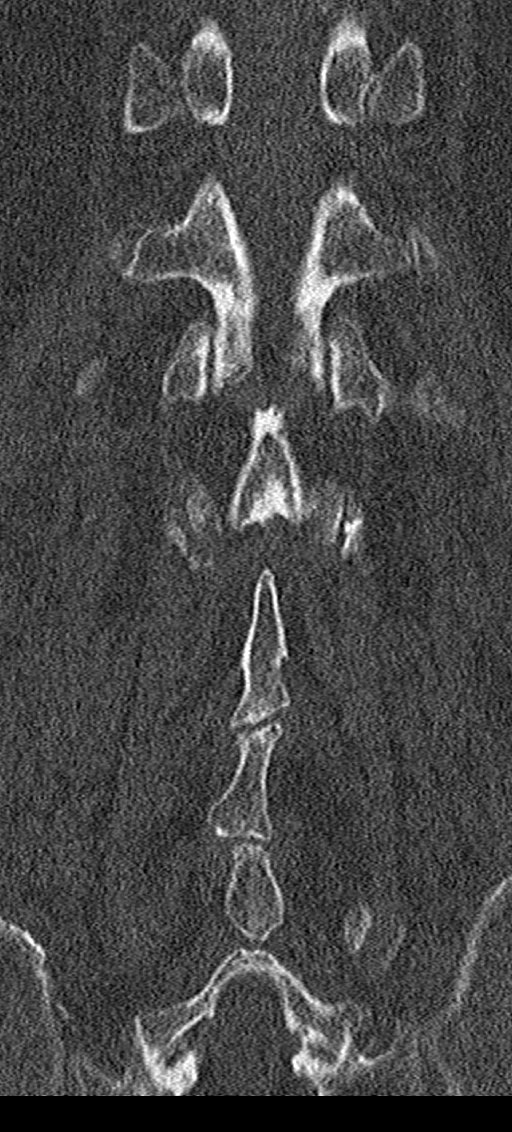
[im 43/72  bone]
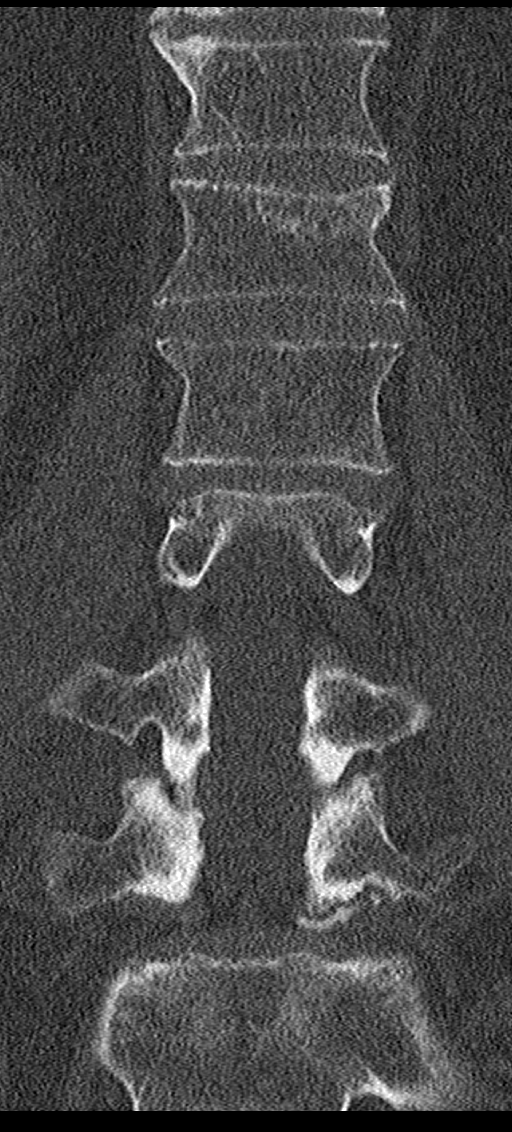

[12 of 33 positions shown; findings below may reference images not displayed]

FINDINGS: Segmentation: 5 lumbar type vertebrae.

Alignment: Normal.

Vertebrae: There is mild sclerosis of the superior endplate and
wedging of the L1 vertebral body concerning for mild compression
deformity of indeterminate age. There is also small Schmorl node in
the superior endplate of L5 vertebral body. Diffuse osteopenia.

Paraspinal and other soft tissues: Negative.

Disc levels: Multilevel DA and disc disease with disc space
narrowing and prominent anterior osteophytes. Multilevel facet joint
arthropathy. No significant spinal canal or neural foraminal
narrowing.
IMPRESSION: 1. Mild sclerosis and anterior wedging of the L1 vertebral body,
concerning for compression deformity of indeterminate age. There is
no retropulsion into the spinal canal. There is no surrounding
edema. If there is localized pain further evaluation with MR would
be helpful.
2. Multilevel DA and disc disease of the lumbar spine with prominent
osteophytes and facet joint arthropathy. No significant spinal canal
or neural foraminal narrowing.

## 2021-08-09 IMAGING — DX DG ELBOW COMPLETE 3+V*L*
4 series · 4 of 4 positions shown · non-contrast
Comparison: None.

CLINICAL DATA: Fall, pain

EXAM:
LEFT ELBOW - COMPLETE 3+ VIEW

[elbow ap]
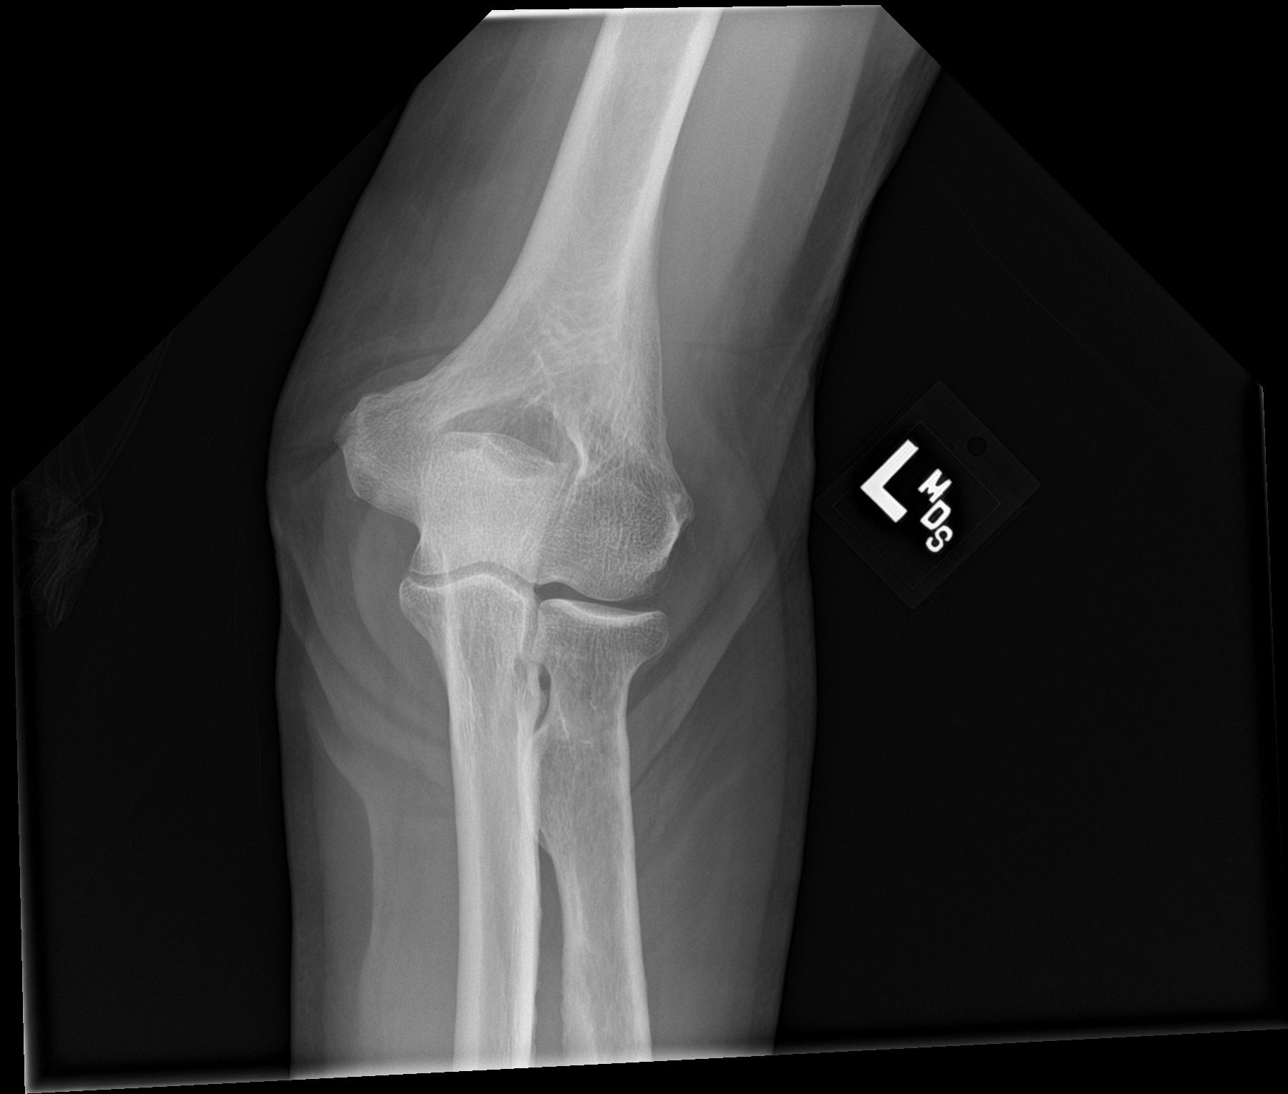

[elbow obl (1 of 2)]
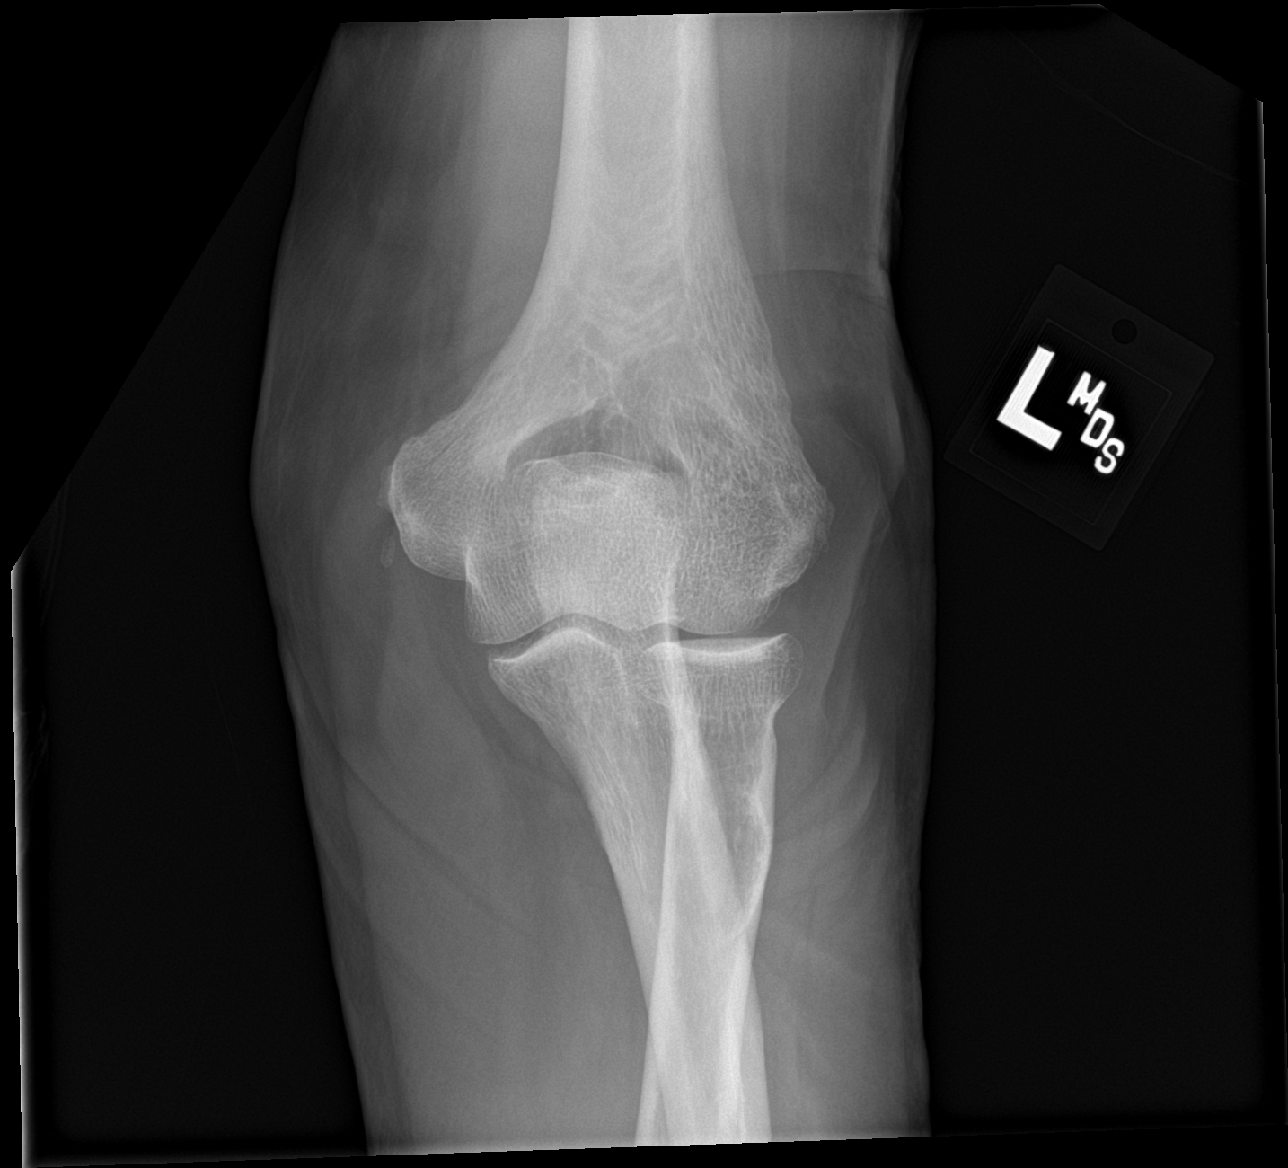

[elbow obl (2 of 2)]
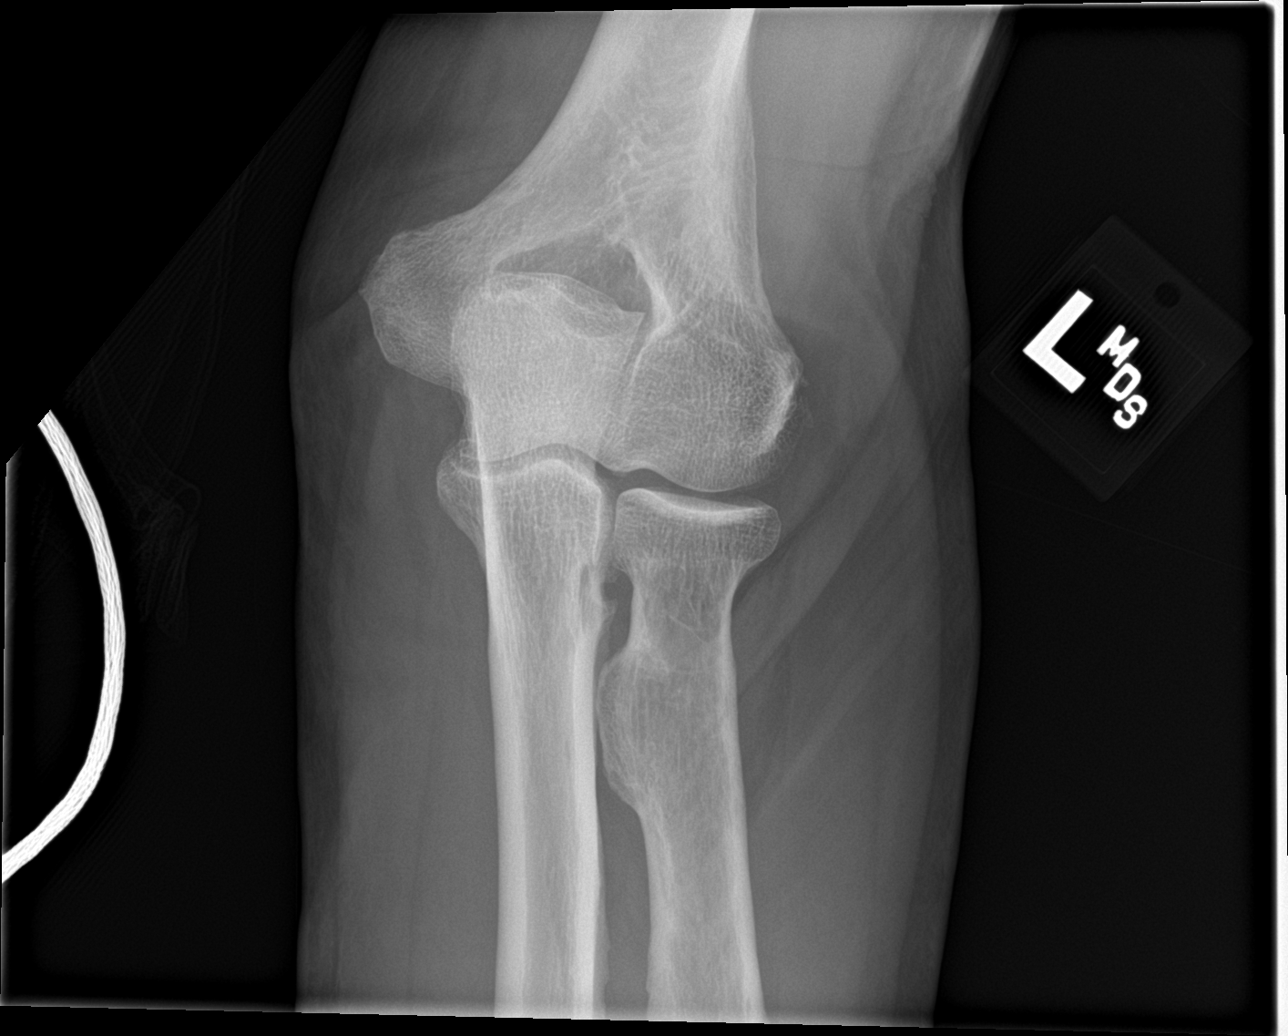

[elbow lat]
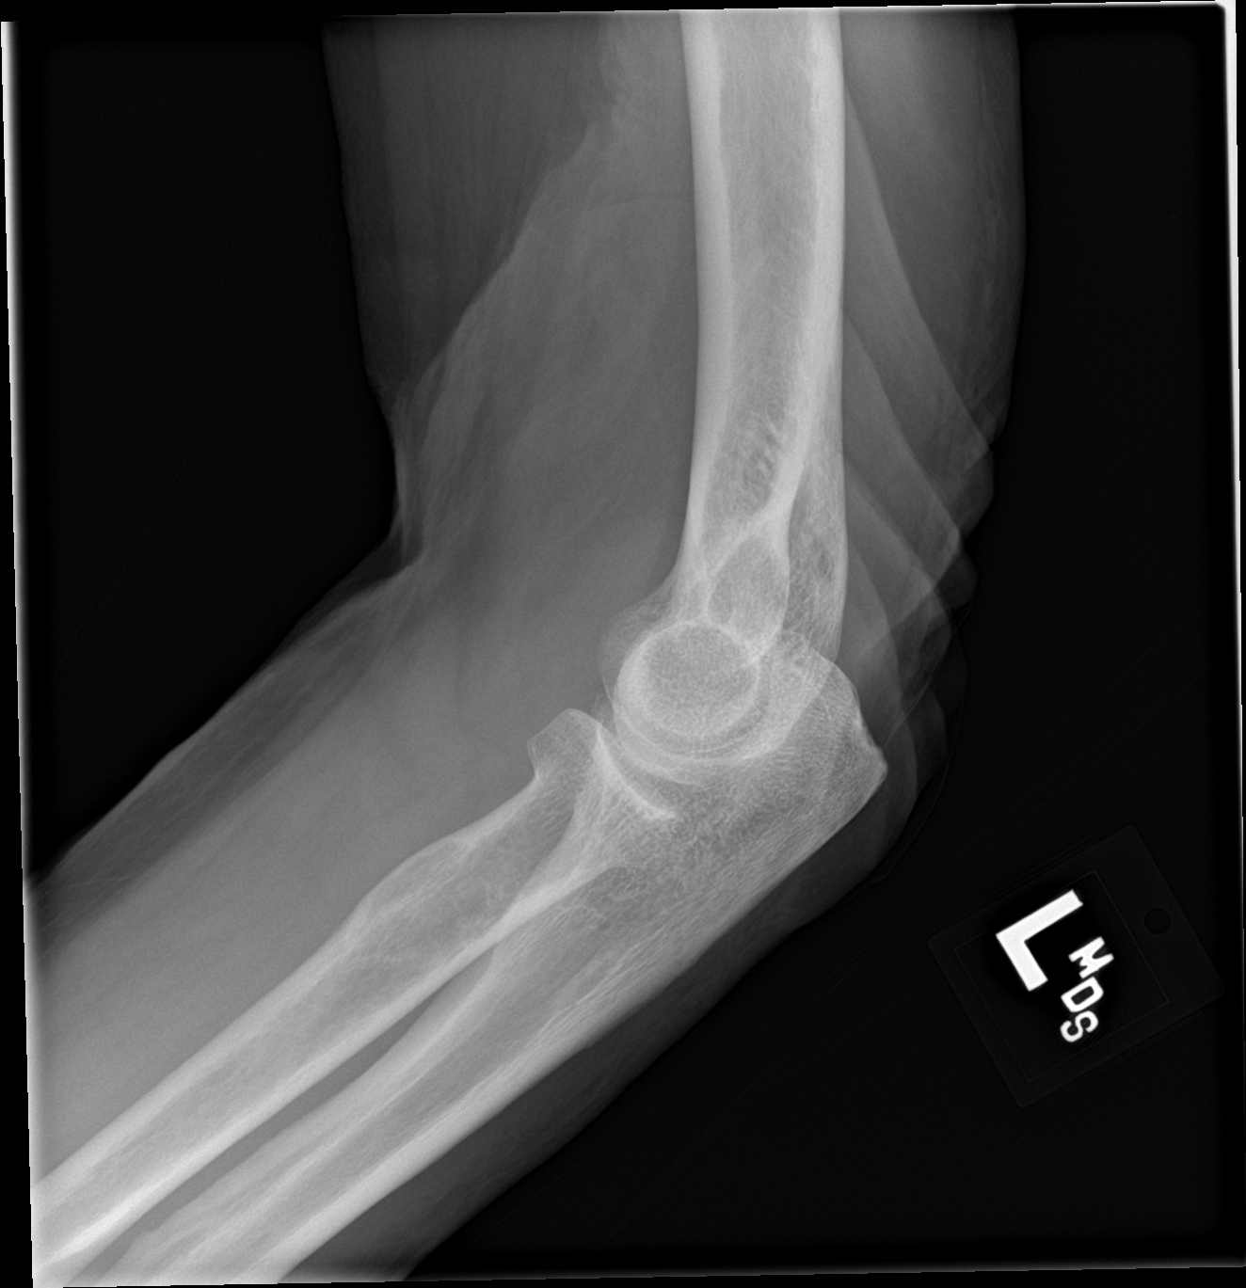

[4 of 4 positions shown; findings below may reference images not displayed]

FINDINGS: There is no evidence of fracture, dislocation, or joint effusion.
There is no evidence of arthropathy or other focal bone abnormality.
Soft tissues are unremarkable.
IMPRESSION: No evidence of acute fracture or dislocation.

## 2021-08-09 IMAGING — MR MR HEAD W/O CM
12 series · 47 of 48 positions shown · non-contrast
Comparison: CT head [DATE]

CLINICAL DATA: Dizziness.  Fall and hit head.

EXAM:
MRI HEAD WITHOUT CONTRAST
TECHNIQUE: Multiplanar, multiecho pulse sequences of the brain and surrounding
structures were obtained without intravenous contrast.

[Series 5: ax dwi_tracew · axial · 3.0mm · 0.65mm/px · z∈[-102,+46]mm · 4 of 48 slices shown]
[im 1/48]
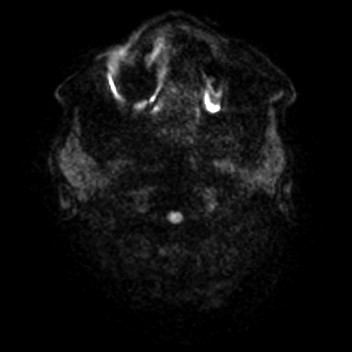
[im 16/48]
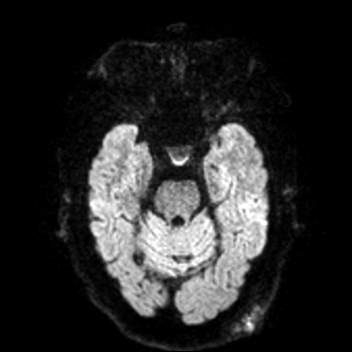
[im 32/48]
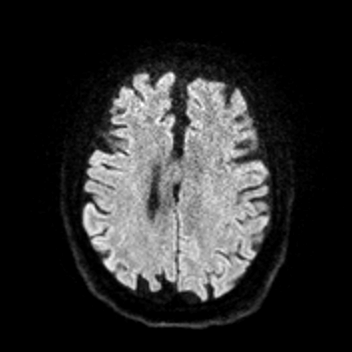
[im 48/48]
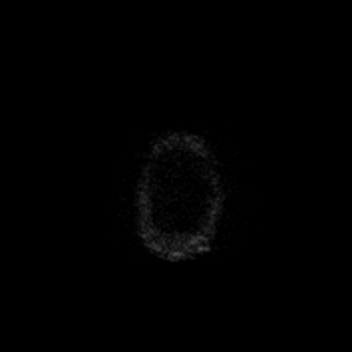

[Series 6: ax dwi_adc · axial · 3.0mm · 0.65mm/px · z∈[-102,+46]mm · 4 of 48 slices shown]
[im 1/48]
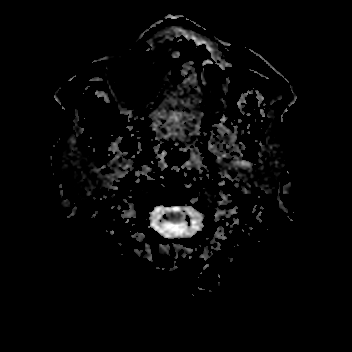
[im 16/48]
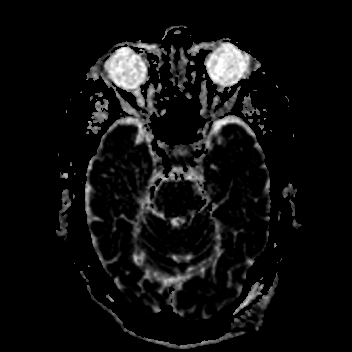
[im 32/48]
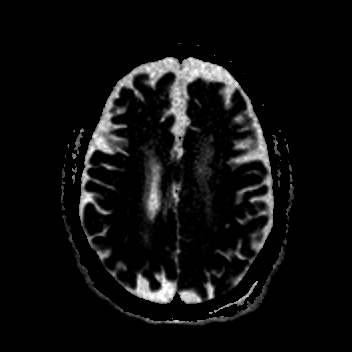
[im 48/48]
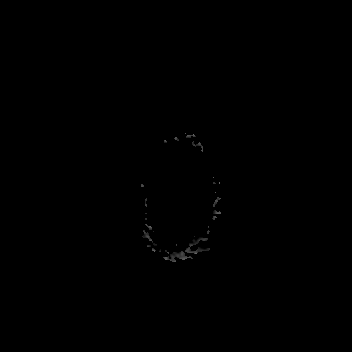

[Series 7: cor dwi_tracew · coronal · 5.0mm · 0.65mm/px · 3 of 38 slices shown]
[im 1/38]
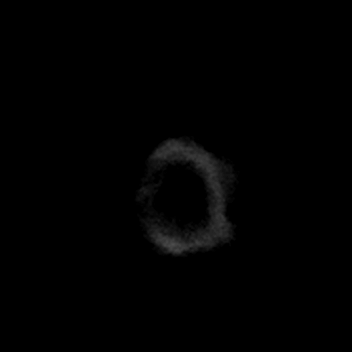
[im 19/38]
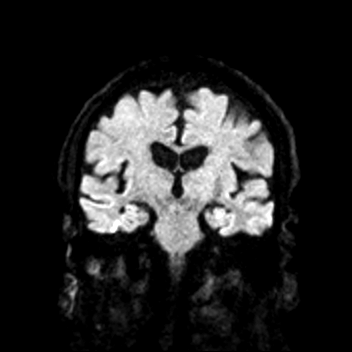
[im 38/38]
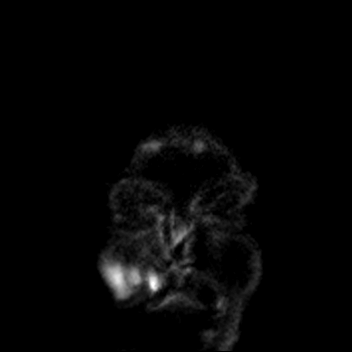

[Series 8: cor dwi_adc · coronal · 5.0mm · 0.65mm/px · 3 of 38 slices shown]
[im 1/38]
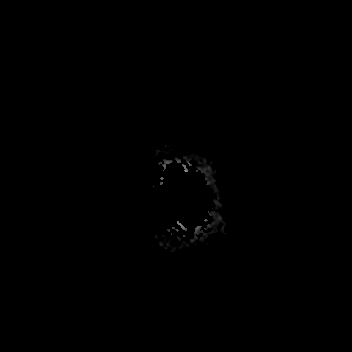
[im 19/38]
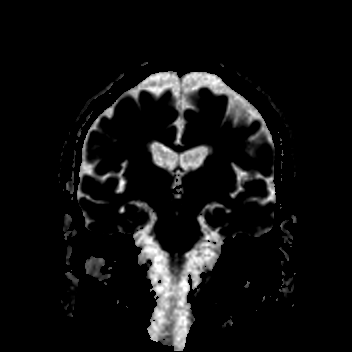
[im 38/38]
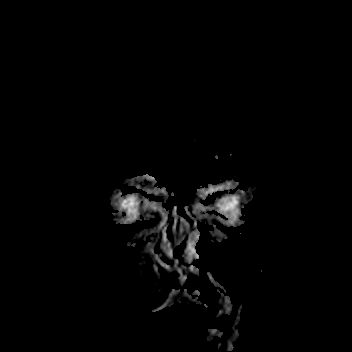

[Series 9: T1 · sagittal · 5.0mm · 0.62mm/px · 2 of 22 slices shown (1 of 2)]
[im 1/22]
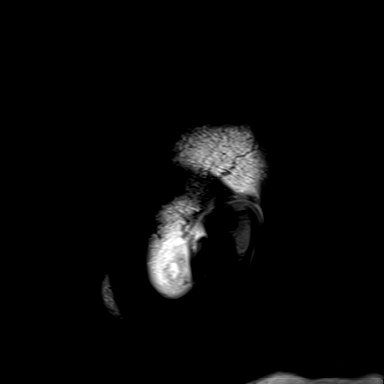
[im 22/22]
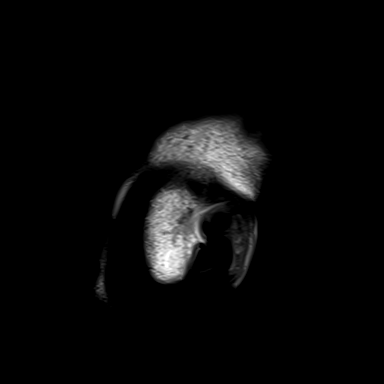

[Series 10: T2 · axial · 5.0mm · 0.53mm/px · z∈[-98,+39]mm · 2 of 25 slices shown (1 of 2)]
[im 1/25]
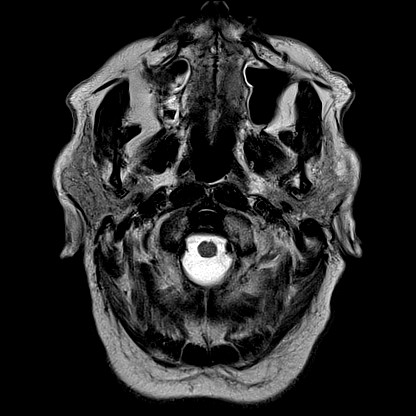
[im 25/25]
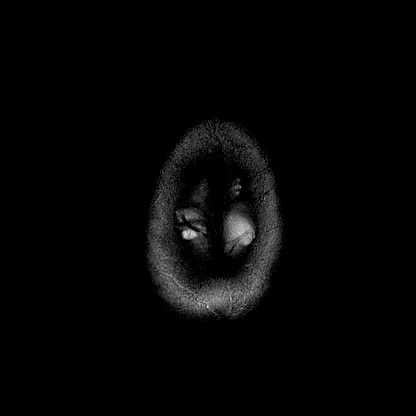

[Series 11: mag_images · axial · 3.0mm · 0.90mm/px · z∈[-112,+57]mm · 4 of 60 slices shown]
[im 1/60]
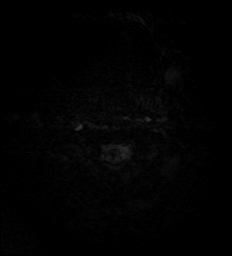
[im 20/60]
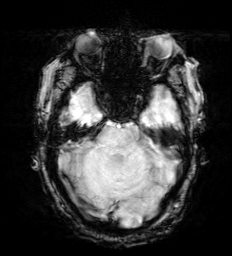
[im 40/60]
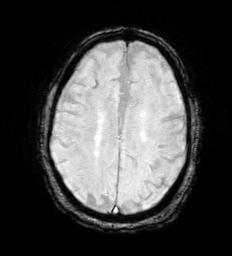
[im 60/60]
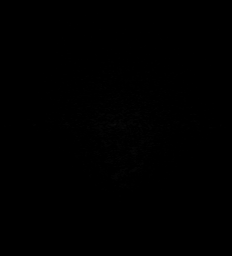

[Series 12: pha_images · axial · 3.0mm · 0.90mm/px · z∈[-112,+57]mm · 4 of 59 slices shown]
[im 1/59]
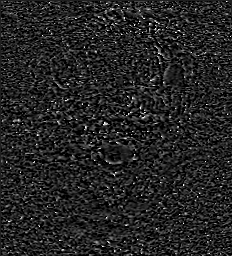
[im 20/59]
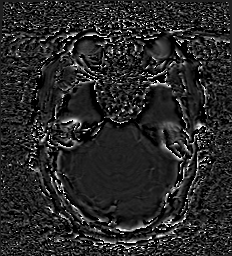
[im 39/59]
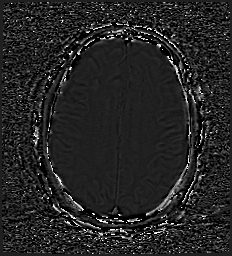
[im 59/59]
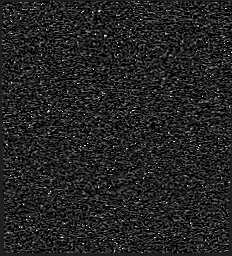

[Series 13: swi_images · axial · 3.0mm · 0.90mm/px · z∈[-112,+57]mm · 4 of 60 slices shown]
[im 1/60]
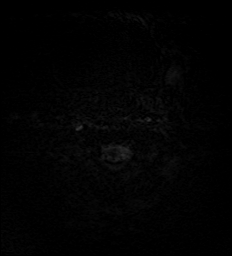
[im 20/60]
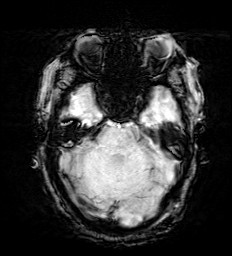
[im 40/60]
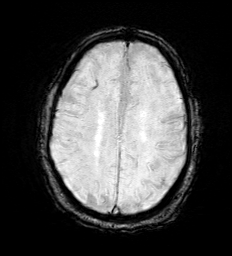
[im 60/60]
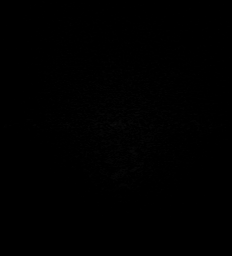

[Series 15: FLAIR · axial · 3.0mm · 0.53mm/px · z∈[-107,+48]mm · 4 of 55 slices shown]
[im 1/55]
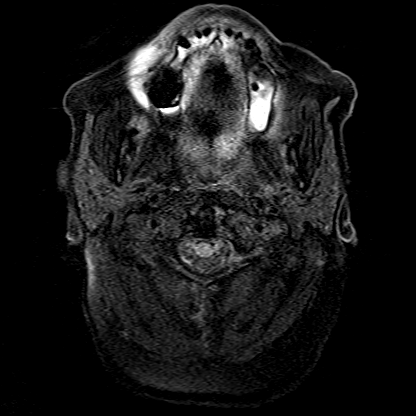
[im 19/55]
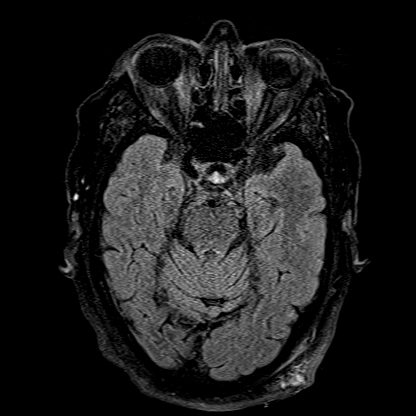
[im 37/55]
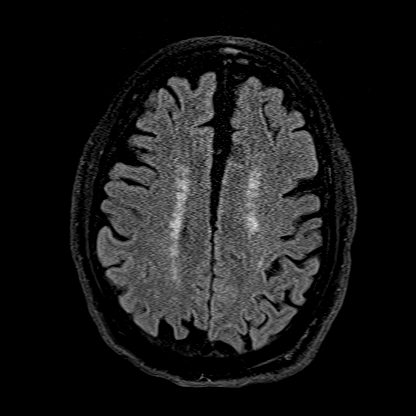
[im 55/55]
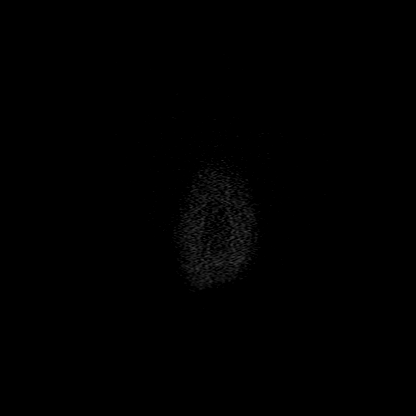

[Series 16: T1 · axial · 1.0mm · 0.98mm/px · z∈[-102,+50]mm · 11 of 160 slices shown (2 of 2)]
[im 1/160]
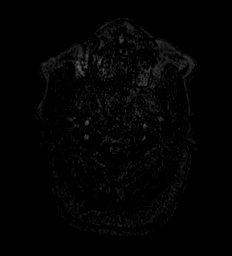
[im 15/160]
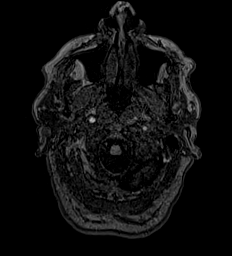
[im 29/160]
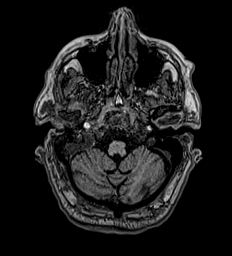
[im 44/160]
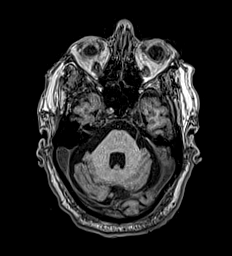
[im 58/160]
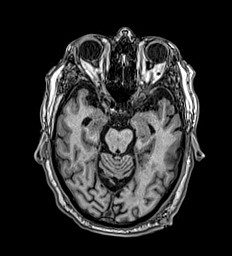
[im 73/160]
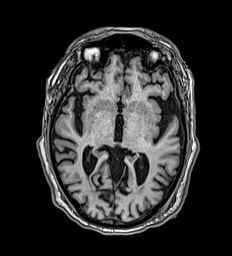
[im 87/160]
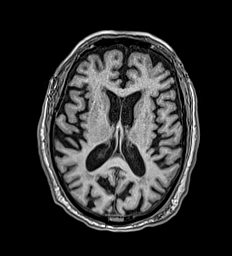
[im 102/160]
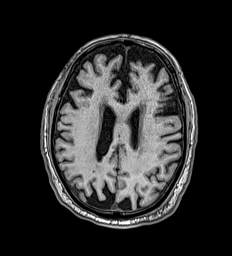
[im 116/160]
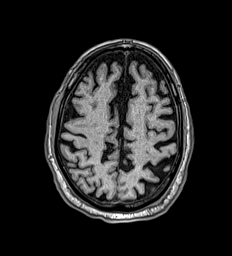
[im 131/160]
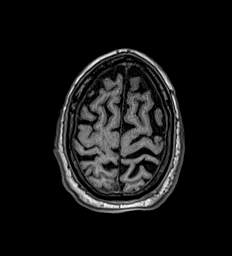
[im 160/160]
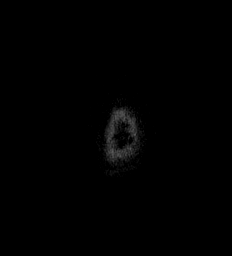

[Series 17: T2 · coronal · 5.0mm · 0.57mm/px · 2 of 29 slices shown (2 of 2)]
[im 1/29]
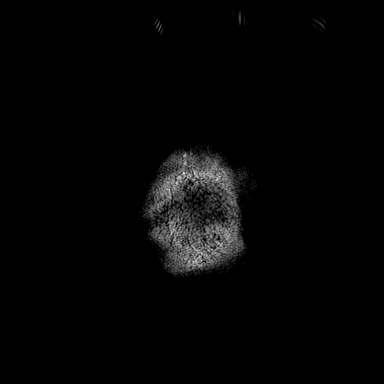
[im 29/29]
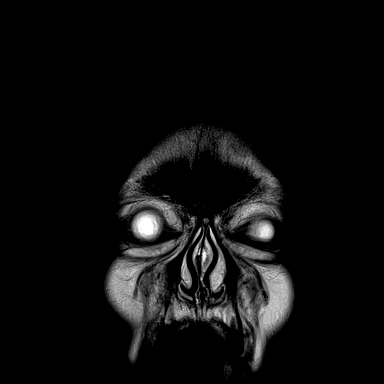

[47 of 48 positions shown; findings below may reference images not displayed]

FINDINGS: Brain: Negative for acute infarct. Mild chronic microvascular
ischemic change in the white matter. Small cortical infarcts in the
left frontal lobe. Negative for intracranial hemorrhage or fluid
collection. Negative for mass lesion. Generalized atrophy. Negative
for hydrocephalus.

Prominent solitary vessel right frontal lobe best seen on
susceptibility weighted in imaging. Probable developmental venous
anomaly.

Vascular: Normal arterial flow voids.

Skull and upper cervical spine: No focal skeletal abnormality.

Sinuses/Orbits: Mild mucosal edema paranasal sinuses. Bilateral
cataract extraction

Other: None
IMPRESSION: Negative for acute infarct.  No acute intracranial hemorrhage

Atrophy with mild chronic ischemic change.

Probable developmental venous anomaly right frontal lobe.

## 2021-08-09 IMAGING — CT CT HEAD W/O CM
4 series · 16 of 47 positions shown, 18 images · non-contrast
Comparison: Head CT [DATE], brain MRI [DATE]

CLINICAL DATA: Dizziness, fall, hit head

EXAM:
CT HEAD WITHOUT CONTRAST
TECHNIQUE: Contiguous axial images were obtained from the base of the skull
through the vertex without intravenous contrast.

[Series 2: head bone · axial · 0.45mm/px · z∈[-87,-55]mm · 3 of 81 slices shown]
[im 9/81  bone]
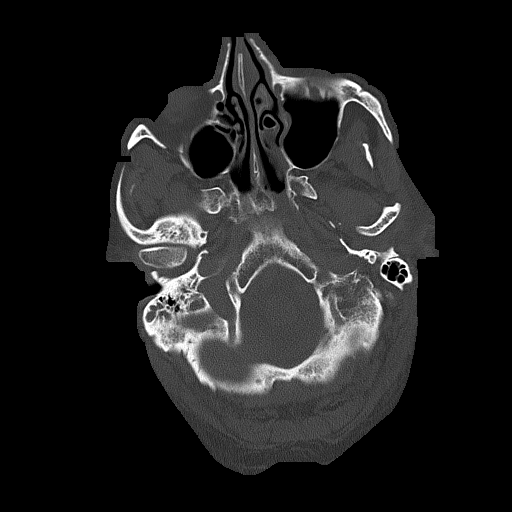
[im 17/81  bone]
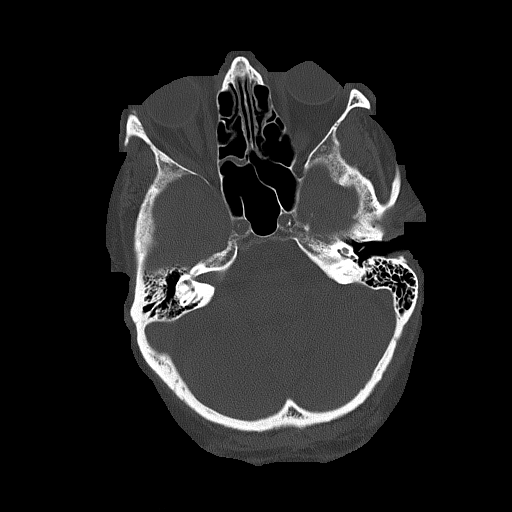
[im 25/81  bone]
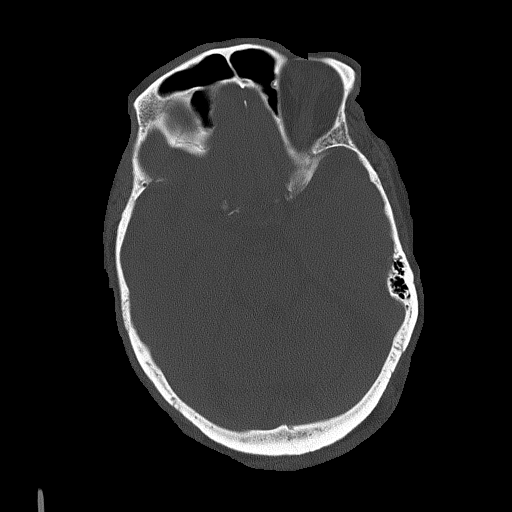

[Series 3: head wo · axial · 0.45mm/px · z∈[-83,+37]mm · 7 of 33 slices shown, 9 images]
[im 5/33  brain]
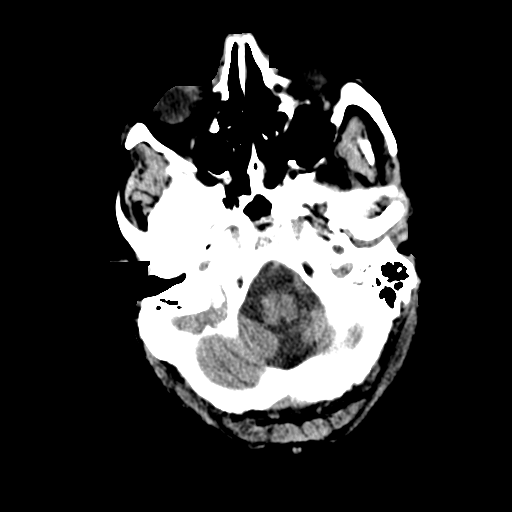
[im 5/33  bone]
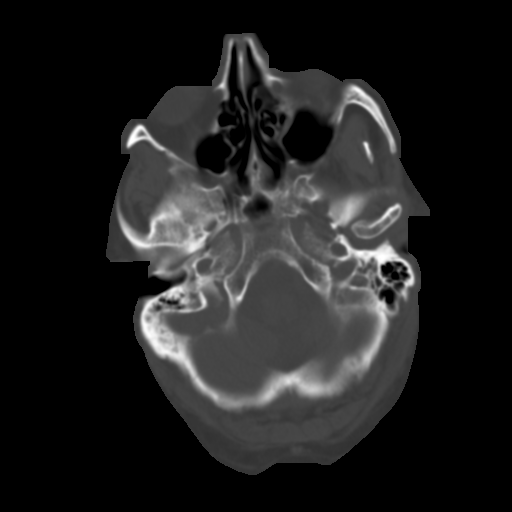
[im 9/33  brain]
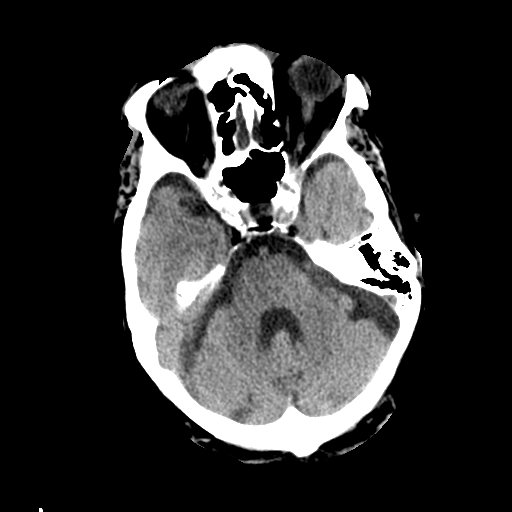
[im 13/33  brain]
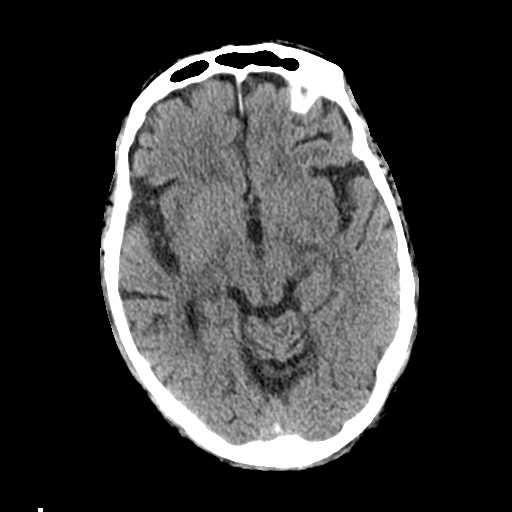
[im 17/33  brain]
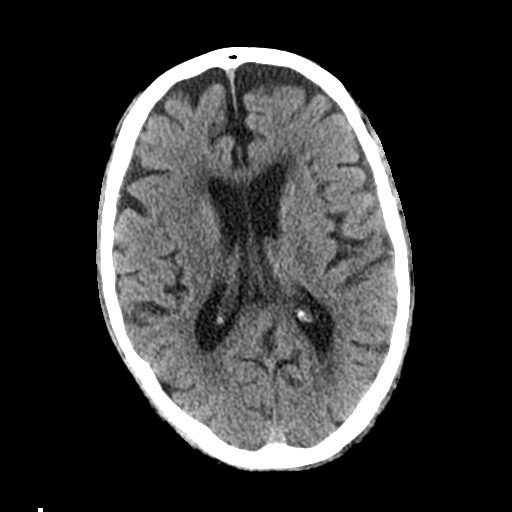
[im 21/33  brain]
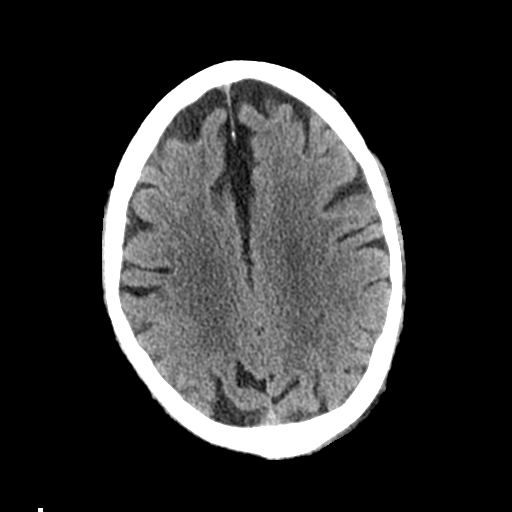
[im 21/33  bone]
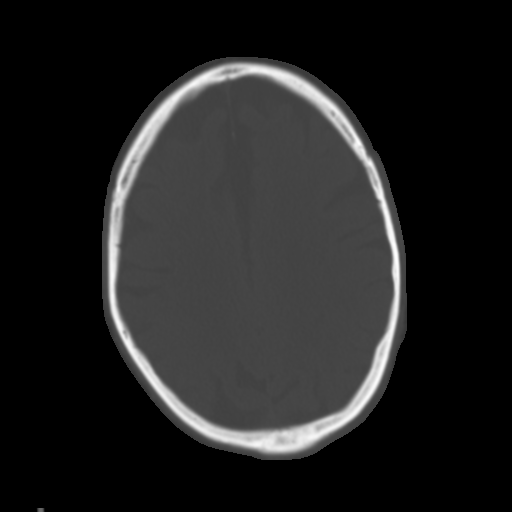
[im 25/33  brain]
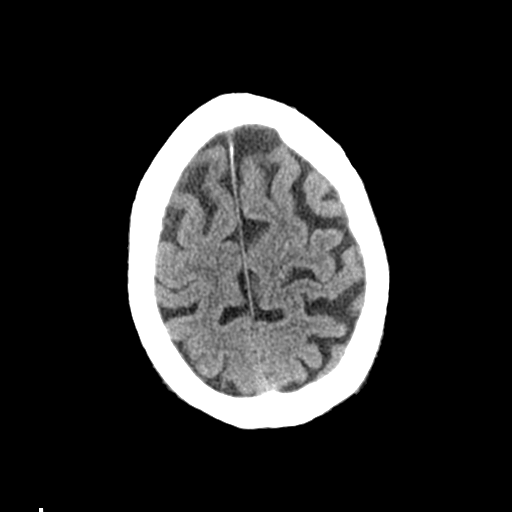
[im 29/33  brain]
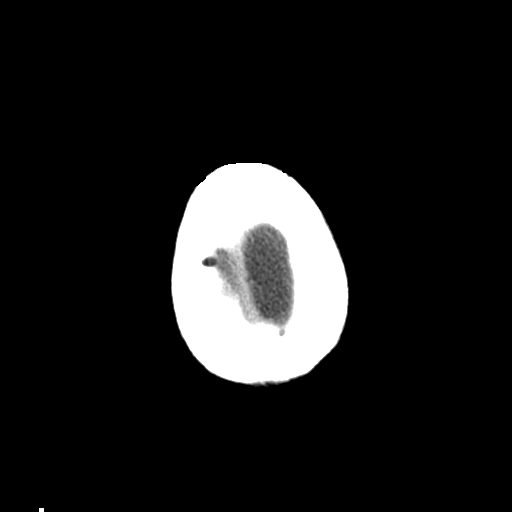

[Series 4: coronal soft tissue · coronal · 0.35mm/px · 3 of 76 slices shown]
[im 26/76  brain]
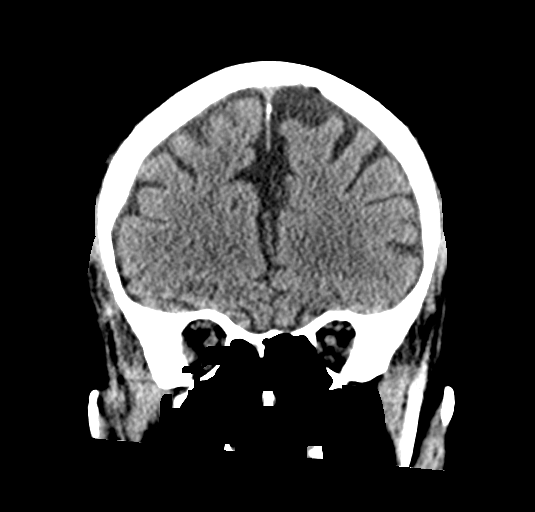
[im 34/76  brain]
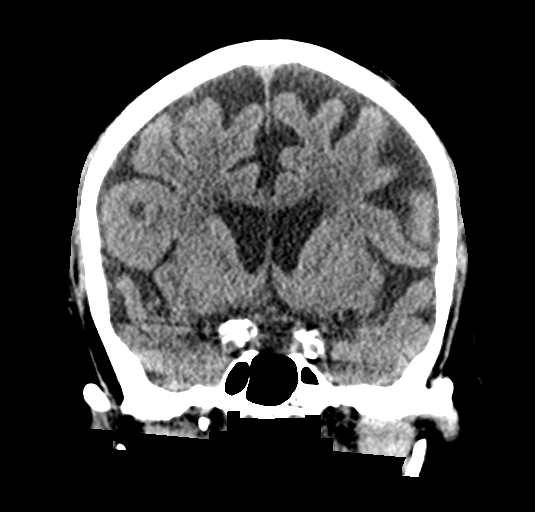
[im 42/76  brain]
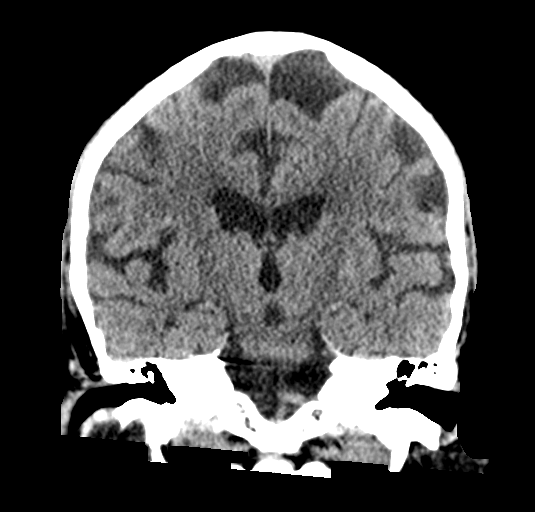

[Series 5: sagittal soft tissue · sagittal · 0.35mm/px · 3 of 57 slices shown]
[im 19/57  brain]
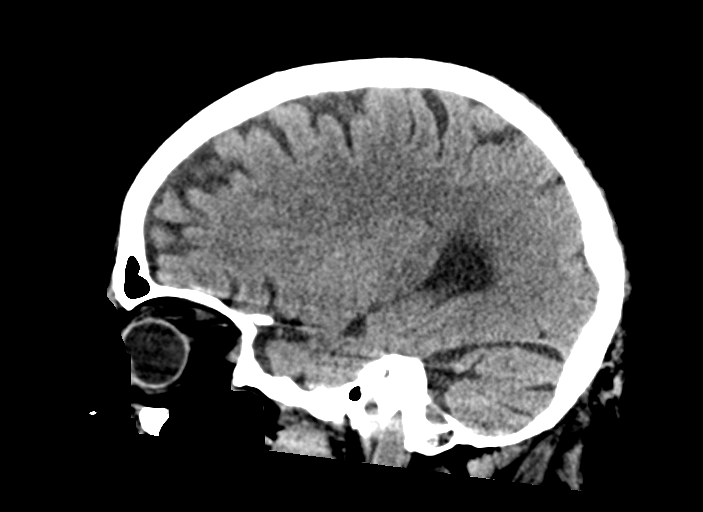
[im 29/57  brain]
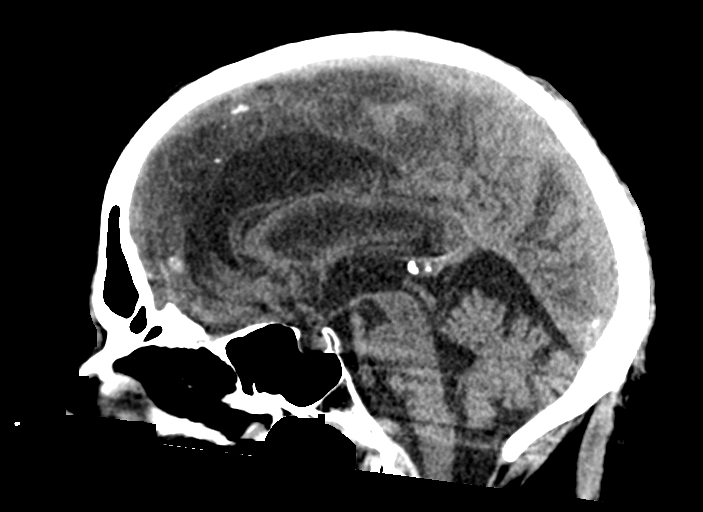
[im 38/57  brain]
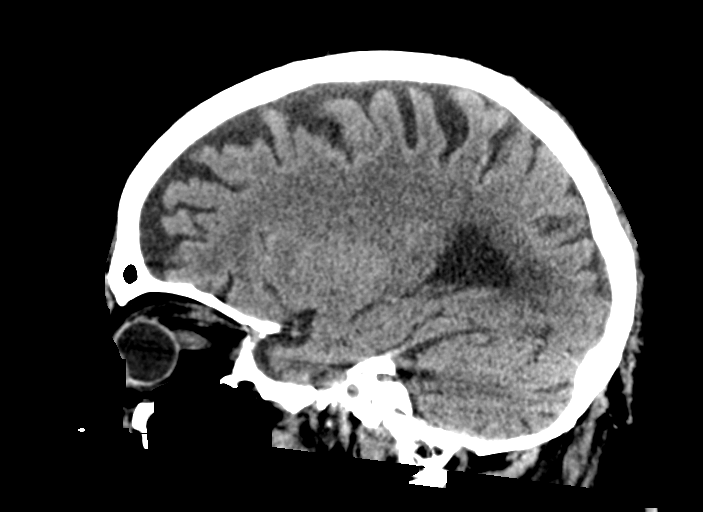

[16 of 47 positions shown; findings below may reference images not displayed]

FINDINGS: Brain: There is no evidence of acute intracranial hemorrhage,
extra-axial fluid collection, or acute infarct.

Parenchymal volume is within normal limits. The ventricles are
normal in size. There is mild chronic white matter microangiopathy.

There is no solid mass lesion.  There is no midline shift.

Vascular: There is calcification of the bilateral cavernous ICAs.

Skull: Normal. Negative for fracture or focal lesion.

Sinuses/Orbits: The imaged paranasal sinuses are clear. Bilateral
lens implants are in place. The globes and orbits are otherwise
unremarkable.

Other: There is a left occipital scalp laceration/small hematoma.
IMPRESSION: 1. No acute intracranial hemorrhage or calvarial fracture.
2. Left occipital scalp laceration/small hematoma.

## 2021-08-09 IMAGING — CR DG HIP (WITH OR WITHOUT PELVIS) 2-3V*L*
1 series · 3 of 3 positions shown · non-contrast
Comparison: Left hip x-rays dated [DATE].

CLINICAL DATA: Left hip pain after a fall.

EXAM:
DG HIP (WITH OR WITHOUT PELVIS) 2-3V LEFT

[Series 1: dg hip unilat w or w/o pelvis 2-3 views  · non-contrast · 0.14mm/px · 3 of 3 slices shown]
[im 1/3]
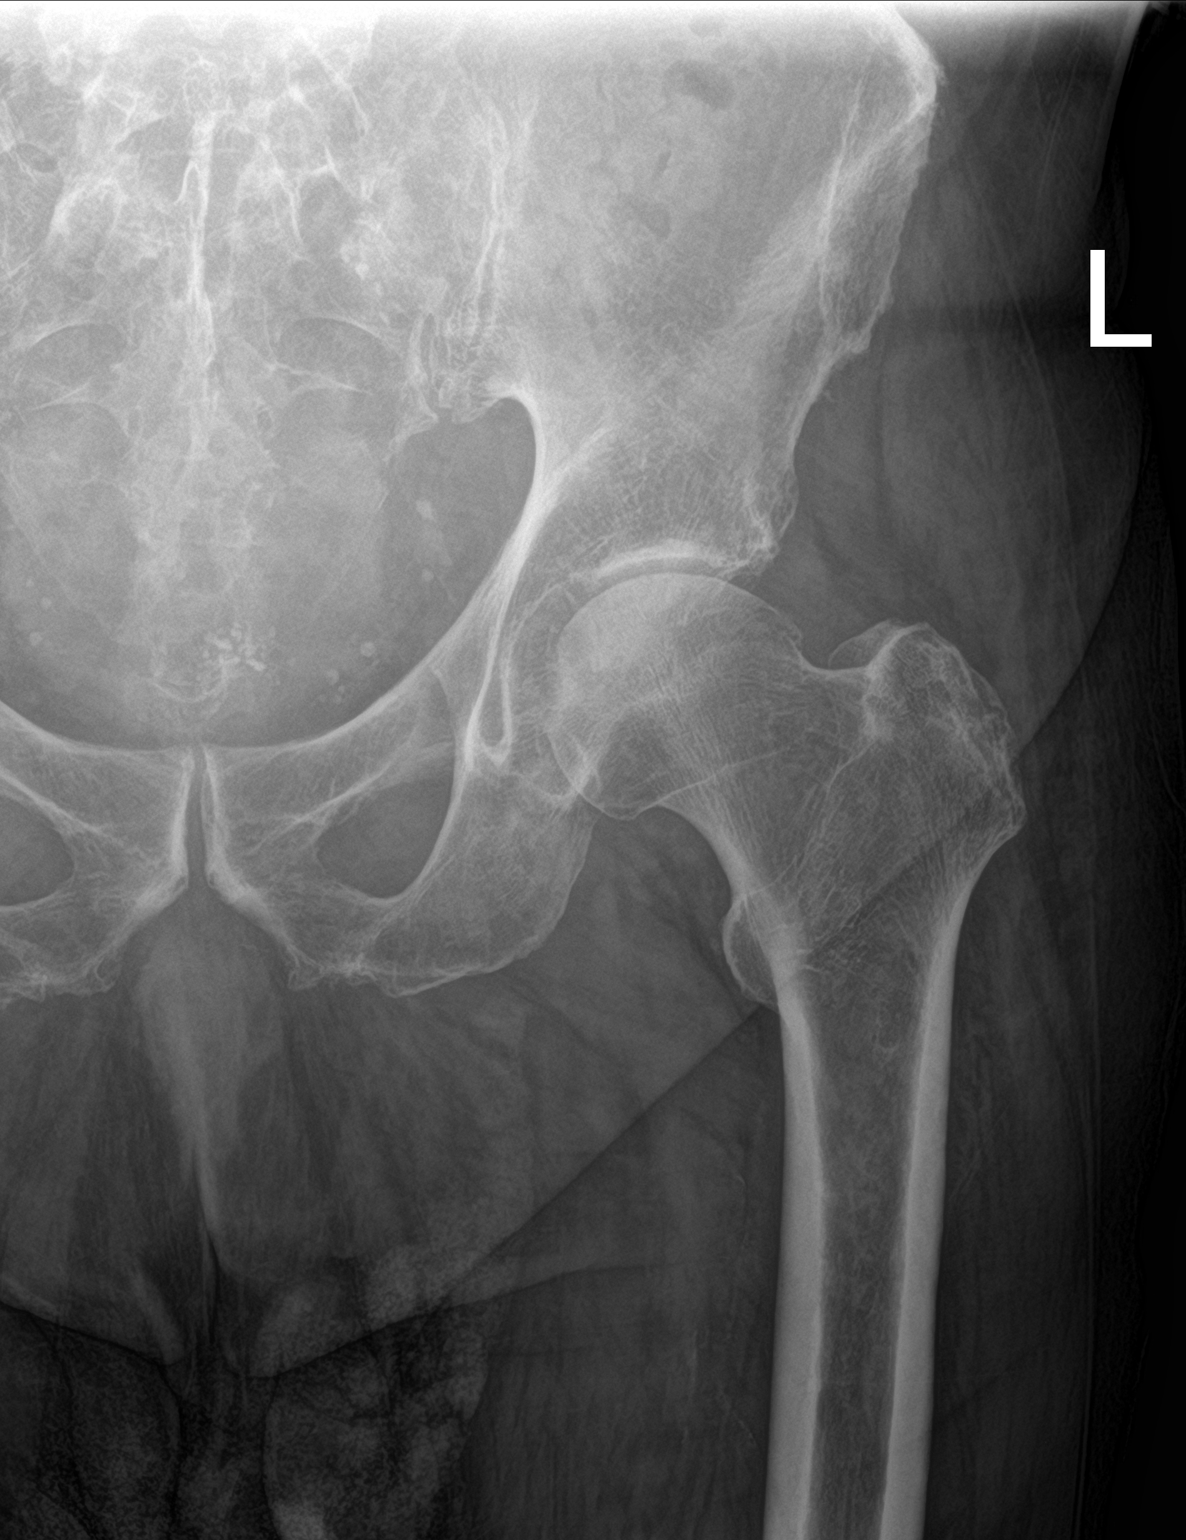
[im 2/3]
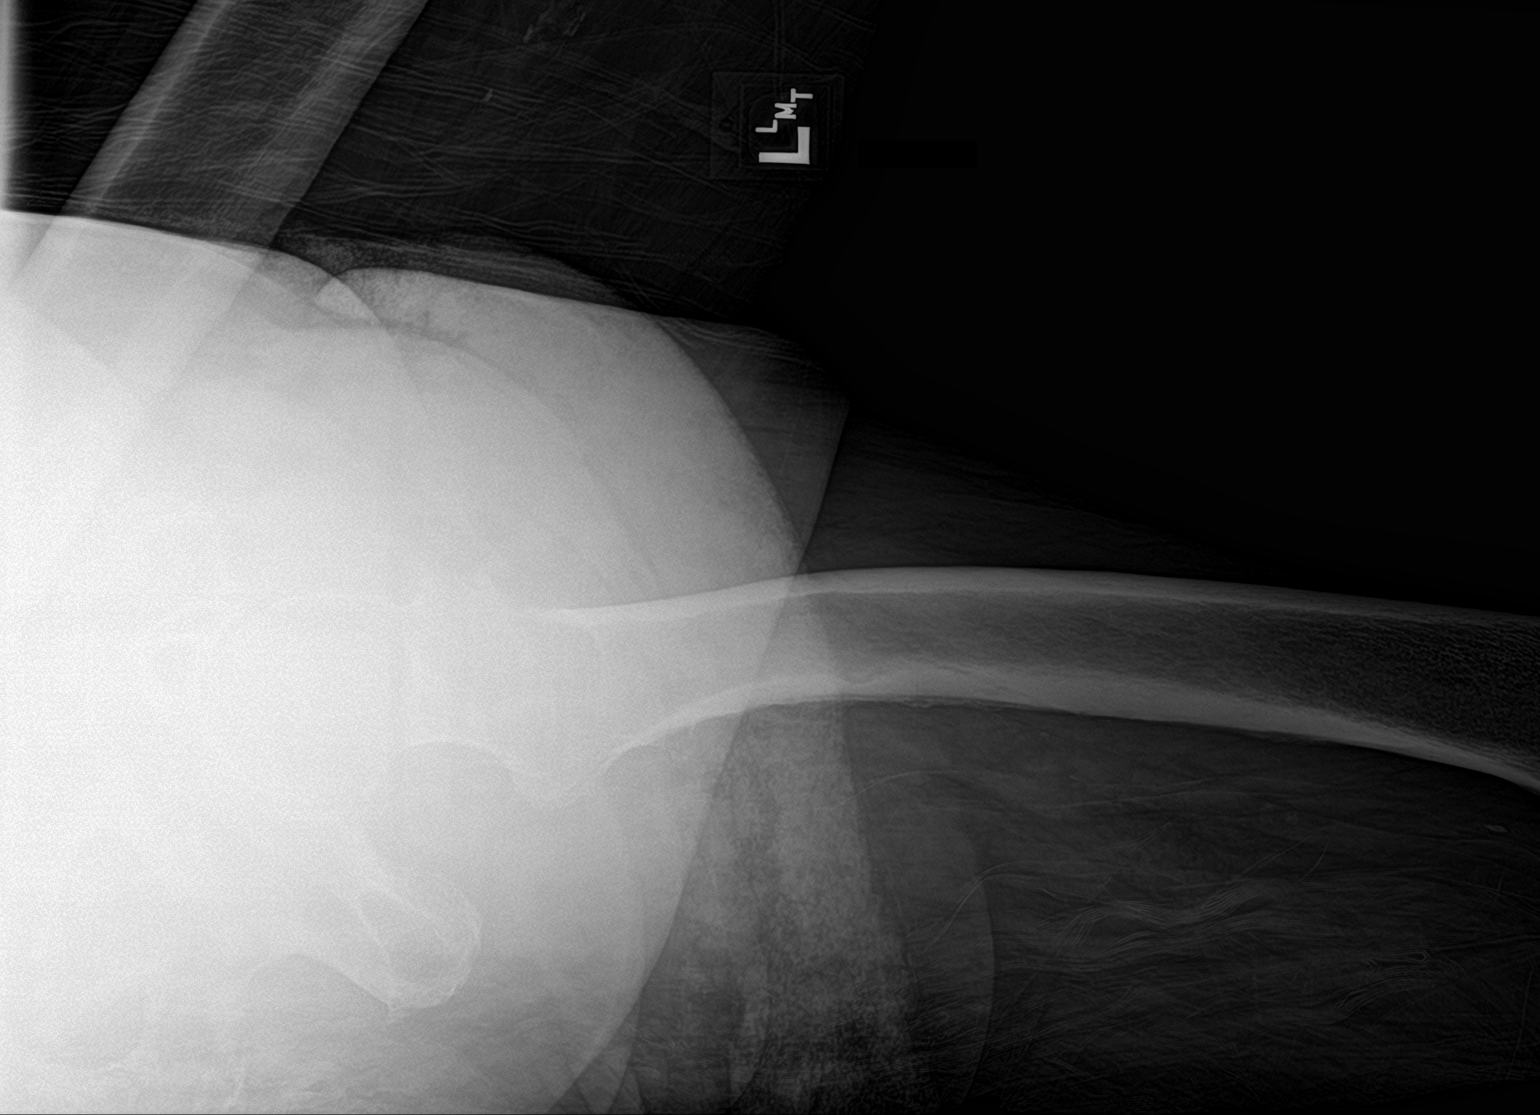
[im 3/3]
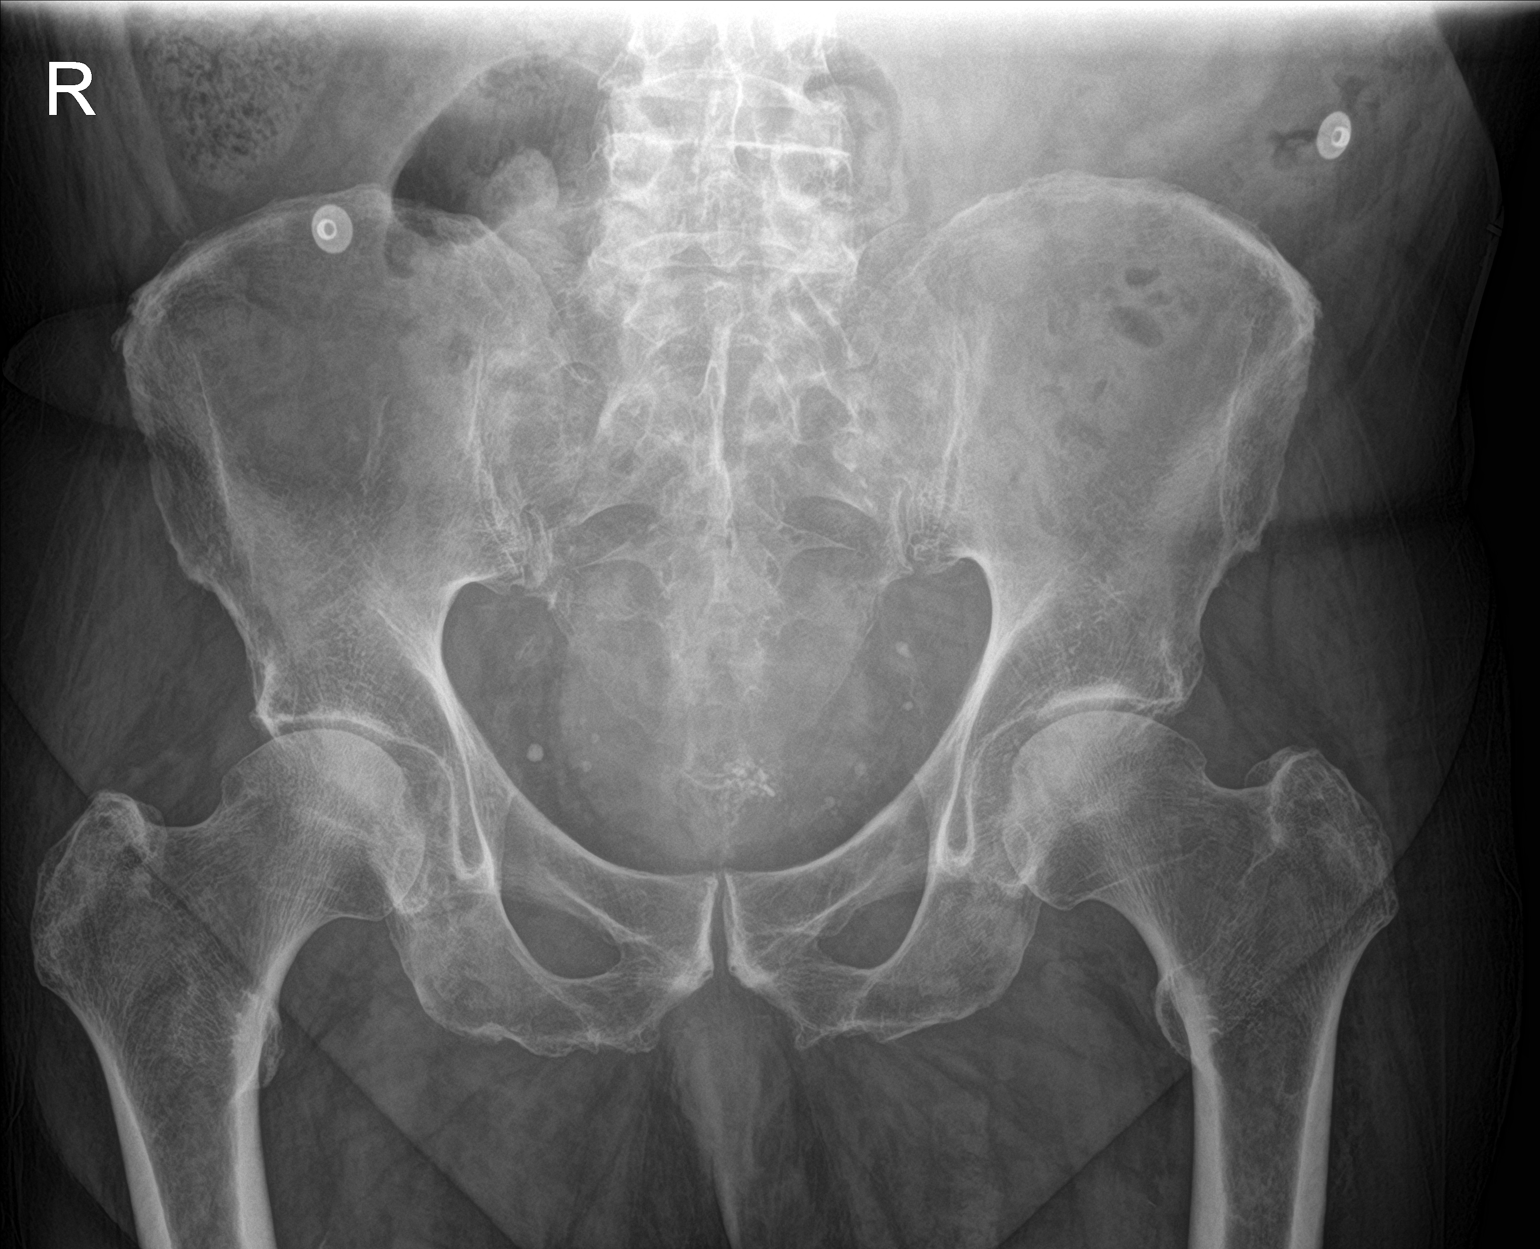

[3 of 3 positions shown; findings below may reference images not displayed]

FINDINGS: No acute fracture or dislocation. Unchanged mild bilateral hip joint
space narrowing superiorly with small marginal osteophytes. Soft
tissues are unremarkable.
IMPRESSION: 1. No acute osseous abnormality.

## 2021-08-09 MED ORDER — ONDANSETRON HCL 4 MG/2ML IJ SOLN
4.0000 mg | Freq: Once | INTRAMUSCULAR | Status: DC
  Filled 2021-08-09: qty 2

## 2021-08-09 MED ORDER — ACETAMINOPHEN 500 MG PO TABS
1000.0000 mg | ORAL_TABLET | Freq: Once | ORAL | Status: AC
  Administered 2021-08-09: 14:00:00 1000 mg via ORAL
  Filled 2021-08-09: qty 2

## 2021-08-09 MED ORDER — LIDOCAINE 5 % EX PTCH
1.0000 | MEDICATED_PATCH | CUTANEOUS | Status: DC
  Administered 2021-08-09: 14:00:00 1 via TRANSDERMAL
  Filled 2021-08-09: qty 1

## 2021-08-09 NOTE — ED Notes (Signed)
Patient ambulated with steady gait in an upright position with two light assist.

## 2021-08-09 NOTE — Discharge Instructions (Signed)
Follow-up with your regular doctor, urgent care, or return emergency department in 10 days for staple removal Take Tylenol for pain if needed Return if worsening

## 2021-08-09 NOTE — ED Triage Notes (Addendum)
Pt comes into the ED via EMS from the [facility] , states he got dizzy and fell this morning , falling back and hitting his head a dresser, pt has a lac to the back of his head with controlled bleeding, skin tear to the left elbow, left hip pain, pt is able to stand on arrival to the wheelchair  94%RA 84 98.6 temp 198/92 176/78

## 2021-08-09 NOTE — ED Provider Notes (Signed)
[facility] Emergency Department Provider Note  ____________________________________________   Event Date/Time   First MD Initiated Contact with Patient 08/09/21 [phone]     (approximate)  I have reviewed the triage vital signs and the nursing notes.   HISTORY  Chief Complaint Fall    HPI Alex Smith is a 86 y.o. male with history of multiple falls and type 2 diabetes presents emergency department via EMS from the [place].  Patient states he stood up and got dizzy this morning then fell hitting the back of his head and his back.  No LOC.  He does have a laceration to the back of the head and a skin tear to the left elbow.  Is also complaining of left lower back pain.  Denies chest pain/shortness of breath  Past Medical History:  Diagnosis Date   Anemia    B12 deficiency    Basal cell carcinoma 03/30/2018   left ant nasal ala   Basal cell carcinoma of face 01/19/2013   right distal dorsum nose   BPH (benign prostatic hypertrophy)    followed by urology, discharged (Dr. 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 pretibial. 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 EDC 09/07/2020   Squamous cell carcinoma of skin 04/03/2020   Left cheek. WD SCC   Systolic murmur 09/40/7680   Type 2 diabetes, controlled, with retinopathy [facility])     Patient Active Problem List   Diagnosis Date Noted    Hypoglycemia 06/29/2021   Severe sepsis ([place]) 05/04/2021   CAP (community acquired pneumonia) 05/02/2021   Vitamin D deficiency 03/10/2019   Depressed mood 03/10/2019   CKD stage 3 due to type 2 diabetes mellitus ([place]) 11/14/2017   Goals of care, counseling/discussion 05/27/2017   Pancytopenia due to antineoplastic chemotherapy ([place]) 11/17/2016   Chemotherapy-induced neutropenia ([place]) 09/22/2016   Urinary incontinence 88/06/314   Systolic murmur 94/58/5929   Myelodysplastic syndrome, low grade ([place]) 02/05/2016   Multiple myeloma not having achieved remission ([place]) 02/05/2016   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   Myelodysplasia present in bone marrow ([place]) 11/03/2015   Advanced care planning/counseling discussion 05/22/2015   Insomnia 03/03/2015   Monoclonal gammopathy 02/03/2015   Macrocytic anemia 02/03/2015   Severe obesity (BMI 35.0-39.9) with comorbidity ([place]) 10/12/2014   History of nonmelanoma skin cancer 10/10/2014   Medicare annual wellness visit, subsequent 04/12/2014   Health maintenance examination 04/12/2014   Hairy cell leukemia, in remission ([place])    B12 deficiency    BPH with obstruction/lower urinary tract symptoms    Essential hypertension    GERD (gastroesophageal reflux disease)    Dyslipidemia    Type 2 diabetes mellitus with hypoglycemia without coma ([place])    Diverticulosis    Macular hole 07/07/2012    Past Surgical History:  Procedure Laterality Date   25 GAUGE  PARS PLANA VITRECTOMY WITH 20 GAUGE MVR PORT FOR MACULAR HOLE  07/07/2012   Procedure: 25 GAUGE PARS PLANA VITRECTOMY WITH 20 GAUGE MVR PORT FOR MACULAR HOLE;  Surgeon: Hayden Pedro, MD;  Location: [facility];  Service: Ophthalmology;  Laterality: Left;   BONE MARROW BIOPSY  2016   CARDIOVASCULAR STRESS TEST  2013   treadmill - no evidence ischemia, EF 61%   CATARACT EXTRACTION W/ INTRAOCULAR LENS  IMPLANT, BILATERAL  ~ 2010   COLONOSCOPY  2014   [place] WNL no rpt needed, h/o polyps   EYE SURGERY  Left 06/2012   laser surgery   GAS INSERTION  07/07/2012   Procedure: INSERTION OF GAS;  Surgeon: Hayden Pedro, MD;  Location: [place];  Service: Ophthalmology;  Laterality: Left;  C3F8   PERIPHERAL VASCULAR CATHETERIZATION N/A 02/23/2015   Procedure: Glori Luis Cath Insertion;  Surgeon: Algernon Huxley, MD;  Location: [place] [facility];  Service: Cardiovascular;  Laterality: N/A;   PORTA CATH REMOVAL N/A 02/16/2020   Procedure: PORTA CATH REMOVAL;  Surgeon: Algernon Huxley, MD;  Location: [place] [facility];  Service: Cardiovascular;  Laterality: N/A;   SERUM PATCH  07/07/2012   Procedure: SERUM PATCH;  Surgeon: Hayden Pedro, MD;  Location: [place];  Service: Ophthalmology;  Laterality: Left;   SKIN CANCER EXCISION     "all over my face" (07/07/2012)    Prior to Admission medications   Medication Sig Start Date End Date Taking? Authorizing Provider  amLODipine (NORVASC) 5 MG tablet Take 1 tablet (5 mg total) by mouth daily. 05/12/21  Yes Dessa Phi, DO  aspirin EC 81 MG tablet Take 81 mg by mouth daily.   Yes [provider]  doxazosin (CARDURA) 1 MG tablet TAKE ONE TABLET BY MOUTH EVERY DAY 08/31/19  Yes Ria Bush, MD  famotidine (PEPCID) 40 MG tablet Take 0.5 tablets (20 mg total) by mouth daily. NEEDS OFFICE VISIT 07/16/20  Yes Ria Bush, MD  JANUVIA 25 MG tablet Take 25 mg by mouth daily. 07/29/21  Yes [provider]  LANTUS SOLOSTAR 100 UNIT/ML Solostar Pen Inject 10 Units into the skin at bedtime. 07/27/21  Yes [provider]  lisinopril (ZESTRIL) 20 MG tablet TAKE ONE TABLET BY MOUTH TWICE DAILY 03/01/20  Yes Ria Bush, MD  NON FORMULARY Apply 1 application topically as needed. AMITIP5% / GABA10% / LIDO5% Apply topically to arms, abdomen and legs as needed. *Provided by patient's family* 06/28/21  Yes [provider]  pravastatin (PRAVACHOL) 20 MG tablet TAKE ONE TABLET EVERY EVENING 03/16/20  Yes Ria Bush, MD   tamsulosin (FLOMAX) 0.4 MG CAPS capsule TAKE 1 CAPSULE BY MOUTH EVERY DAY 12/11/17  Yes Ria Bush, MD  vitamin B-12 (CYANOCOBALAMIN) 1000 MCG tablet Take 1 tablet (1,000 mcg total) by mouth daily. 06/01/19  Yes Ria Bush, MD  vitamin E 400 UNIT capsule Take 200 Units by mouth daily.    Yes [provider]    Allergies Rituximab, Blood-group specific substance, Primaxin [imipenem], Voriconazole, Sulfa antibiotics, and Sulfacetamide sodium  Family History  Problem Relation Age of Onset   Dementia Mother    Heart failure Father 59   Cancer Sister        breast   Diabetes Paternal Uncle    Diabetes Paternal Aunt    CAD Brother 55       MI   Stroke Neg Hx     Social History Social History   Tobacco Use   Smoking  status: Former    Packs/day: 2.00    Years: 25.00    Pack years: 50.00    Types: Cigarettes    Quit date: 02/06/1969    Years since quitting: 52.5   Smokeless tobacco: Never   Tobacco comments:    07/07/2012 "stopped smoking ~ 40 yr ago; smoked 20-17yr  Vaping Use   Vaping Use: Never used  Substance Use Topics   Alcohol use: Yes    Alcohol/week: 0.0 standard drinks    Comment: rare   Drug use: No    Review of Systems  Constitutional: No fever/chills Eyes: No visual changes. ENT: No sore throat. Respiratory: Denies cough Cardiovascular: Denies chest pain Gastrointestinal: Denies abdominal pain Genitourinary: Negative for dysuria. Musculoskeletal: Positive for back pain. Skin: Negative for rash. Psychiatric: no mood changes,     ____________________________________________   PHYSICAL EXAM:  VITAL SIGNS: ED Triage Vitals [08/09/21 0827]  Enc Vitals Group     BP (!) 176/63     Pulse Rate 86     Resp 18     Temp 98.3 F (36.8 C)     Temp Source Oral     SpO2 92 %     Weight 200 lb (90.7 kg)     Height _0  (1.702 m)     Head Circumference      Peak Flow      Pain Score 0     Pain Loc      Pain Edu?      Excl. in  [place]     Constitutional: Alert and oriented. Well appearing and in no acute distress. Eyes: Conjunctivae are normal.  Head: Swelling noted to the posterior scalp, 2 cm laceration noted to the scalp, blood is matted in the hair Nose: No congestion/rhinnorhea. Mouth/Throat: Mucous membranes are moist.   Neck:  supple no lymphadenopathy noted Cardiovascular: Normal rate, regular rhythm. Heart sounds are normal Respiratory: Normal respiratory effort.  No retractions, lungs c t a  Abd: soft nontender bs normal all 4 quad GU: deferred Musculoskeletal: FROM all extremities, warm and well perfused, lumbar spine is tender to palpation, C-spine and T-spine are nontender, grips are equal bilaterally, neurovascular intact Neurologic:  Normal speech and language.  Patient at baseline Skin:  Skin is warm, dry and intact. No rash noted. Psychiatric: Mood and affect are normal. Speech and behavior are normal.  Patient at baseline  ____________________________________________   LABS (all labs ordered are listed, but only abnormal results are displayed)  Labs Reviewed  BASIC METABOLIC PANEL - Abnormal; Notable for the following components:      Result Value   Glucose, Bld 258 (*)    Calcium 8.7 (*)    GFR, Estimated 59 (*)    Anion gap 4 (*)    All other components within normal limits  CBC - Abnormal; Notable for the following components:   WBC 3.1 (*)    RBC 2.64 (*)    Hemoglobin 9.1 (*)    HCT 27.5 (*)    MCV 104.2 (*)    MCH 34.5 (*)    Platelets 104 (*)    All other components within normal limits  RESP PANEL BY RT-PCR (FLU A&B, COVID) ARPGX2  URINALYSIS, ROUTINE W REFLEX MICROSCOPIC  CBG MONITORING, ED  TROPONIN I (HIGH SENSITIVITY)   ____________________________________________   ____________________________________________  RADIOLOGY  CT of the head, C-spine, lumbar spine, x-ray of the left hip  ____________________________________________   PROCEDURES  Procedure(s)  performed:   .Marland KitchenMarland Kitchenaceration Repair  Date/Time: 08/09/2021 2:58 PM Performed by: Versie Starks, PA-C Authorized by: Versie Starks, PA-C   Consent:    Consent obtained:  Verbal   Consent given by:  Patient   Risks, benefits, and alternatives were discussed: yes     Risks discussed:  Infection, pain, retained foreign body, poor cosmetic result, need for additional repair, nerve damage, poor wound healing and vascular damage   Alternatives discussed:  No treatment Universal protocol:    Procedure explained and questions answered to patient or proxy's satisfaction: yes     Patient identity confirmed:  Verbally with patient and arm band Anesthesia:    Anesthesia method:  None Laceration details:    Location:  Scalp   Scalp location:  Occipital   Length (cm):  3 Pre-procedure details:    Preparation:  Patient was prepped and draped in usual sterile fashion and imaging obtained to evaluate for foreign bodies Exploration:    Limited defect created (wound extended): no     Hemostasis achieved with:  Direct pressure   Imaging outcome: foreign body not noted     Wound exploration: wound explored through full range of motion     Wound extent: no foreign bodies/material noted, no muscle damage noted, no nerve damage noted, no underlying fracture noted and no vascular damage noted   Treatment:    Area cleansed with:  Saline   Amount of cleaning:  Standard   Irrigation solution:  Sterile saline   Irrigation method:  Tap Skin repair:    Repair method:  Staples   Number of staples:  7 Approximation:    Approximation:  Close Repair type:    Repair type:  Simple Post-procedure details:    Dressing:  Non-adherent dressing   Procedure completion:  Tolerated well, no immediate complications    ____________________________________________   INITIAL IMPRESSION / ASSESSMENT AND PLAN / ED COURSE  Pertinent labs & imaging results that were available during my care of the patient were  reviewed by me and considered in my medical decision making (see chart for details).   Patient is a 86 year old male presents emergency department after a fall.  See HPI.  Physical exam shows patient was stable at this time.  He does have evidence of a head injury.  Lumbar spine is tender to palpation  Due to the fall with head injury and his age will order CT of the head and C-spine, CT of lumbar spine due to the tenderness, x-ray left hip ordered from triage  Plan at this time is to make sure imaging is normal then proceed with laceration repair.  DDx: Skull fracture, subdural hematoma, subarachnoid hemorrhage, scalp hematoma, laceration, C-spine fracture, lumbar fracture, left hip fracture  Labs are reassuring at this time, they appear to be in the patient's normal trend even though the H&H are decreased they have improved since his last blood draw.  Metabolic panel shows elevated glucose of 258 which is slightly higher than his last glucose.  Troponin is normal so do not feel the patient had an MI prior to his fall  EKG showed sinus rhythm with first-degree heart block, see physician read  CT of the head and C-spine reviewed by me confirmed by radiology be negative for subdural hematoma, subarachnoid, does show swelling at the laceration site at posterior scalp.  Of note the radiologist did comment on T1, the patient is not tender at T1, his daughter states that he did have a fall 2 months ago this may be.  The injury to T1 occurred.  X-ray of the left hip reviewed by me confirmed by radiology to be negative  Due to the tenderness of the lumbar spine will send patient for CT lumbar spine  CT lumbar spine shows a L1 compression fracture of undetermined age. MRI of the brain due to continued dizziness is negative for intracranial hemorrhage or acute abnormality  X-ray of the left elbow is negative for fracture  See procedure note for laceration repair  I did explain all the findings to  the patient and his family. Nursing staff was able to have the patient ambulate.  Daughter states he is at baseline.  Therefore I think he is able to go back to the [facility].  Staples should be removed in 10 days.  Suture care instructions, fall prevention instructions, contusions were all addressed and discharge instructions.  Instructions were given instructions to give him Tylenol as needed for pain at the nursing home.  He was discharged in stable condition  Alex Smith was evaluated in Emergency Department on 08/09/2021 for the symptoms described in the history of present illness. He was evaluated in the context of the global COVID-19 pandemic, which necessitated consideration that the patient might be at risk for infection with the SARS-CoV-2 virus that causes COVID-19. Institutional protocols and algorithms that pertain to the evaluation of patients at risk for COVID-19 are in a state of rapid change based on information released by regulatory bodies including the CDC and federal and state organizations. These policies and algorithms were followed during the patient's care in the ED.    As part of my medical decision making, I reviewed the following data within the [facility] History obtained from family, Nursing notes reviewed and incorporated, Labs reviewed , EKG interpreted sinus rhythm with first-degree AV block, Old chart reviewed, Radiograph reviewed , Evaluated by EM attending Dr. Nickolas Madrid, Notes from prior ED visits, and [place] Controlled Substance Database  ____________________________________________   FINAL CLINICAL IMPRESSION(S) / ED DIAGNOSES  Final diagnoses:  Fall, initial encounter  Dizziness  Laceration of scalp, initial encounter  Contusion of scalp, initial encounter  Contusion of left hip, initial encounter  Compression fracture of L1 vertebra, initial encounter ([place])  Compression fracture of body of thoracic vertebra ([place])      NEW MEDICATIONS STARTED  DURING THIS VISIT:  New Prescriptions   No medications on file     Note:  This document was prepared using Dragon voice recognition software and may include unintentional dictation errors.    Versie Starks, PA-C 08/09/21 1503    Vanessa [place], MD 08/10/21 [phone]

## 2021-08-09 NOTE — ED Notes (Signed)
Son is coming to pick up the patient to transport back.

## 2021-08-09 NOTE — ED Notes (Signed)
Skin tear on left elbow was cleaned and dressed with bandage.

## 2021-08-09 NOTE — ED Notes (Signed)
Pt reports got up to use the restroom and the next thing he knew he was in the floor. Pt states he did not pass out or get dizzy he just fell but cannot recall why. Pt c/o pain to left hip.

## 2021-08-09 NOTE — ED Notes (Signed)
Patient is in imaging

## 2021-08-10 ENCOUNTER — Ambulatory Visit: Payer: Medicare Other

## 2021-08-10 ENCOUNTER — Ambulatory Visit: Payer: Medicare Other | Admitting: Nurse Practitioner

## 2021-08-10 ENCOUNTER — Other Ambulatory Visit: Payer: Medicare Other

## 2021-08-15 ENCOUNTER — Other Ambulatory Visit: Payer: Self-pay | Admitting: *Deleted

## 2021-08-15 DIAGNOSIS — D649 Anemia, unspecified: Secondary | ICD-10-CM

## 2021-08-15 DIAGNOSIS — M545 Low back pain, unspecified: Secondary | ICD-10-CM | POA: Insufficient documentation

## 2021-08-15 DIAGNOSIS — D46Z Other myelodysplastic syndromes: Secondary | ICD-10-CM

## 2021-08-15 DIAGNOSIS — G8911 Acute pain due to trauma: Secondary | ICD-10-CM | POA: Insufficient documentation

## 2021-08-18 ENCOUNTER — Other Ambulatory Visit: Payer: Self-pay

## 2021-08-18 ENCOUNTER — Inpatient Hospital Stay
Admission: EM | Admit: 2021-08-18 | Discharge: 2021-08-22 | DRG: 552 | Disposition: A | Discharge status: Skilled Nursing Facility | Payer: Medicare Other | Source: Skilled Nursing Facility | Attending: Internal Medicine | Admitting: Internal Medicine

## 2021-08-18 ENCOUNTER — Emergency Department: Payer: Medicare Other

## 2021-08-18 DIAGNOSIS — K219 Gastro-esophageal reflux disease without esophagitis: Secondary | ICD-10-CM | POA: Diagnosis present

## 2021-08-18 DIAGNOSIS — Z86008 Personal history of in-situ neoplasm of other site: Secondary | ICD-10-CM

## 2021-08-18 DIAGNOSIS — Z6831 Body mass index (BMI) 31.0-31.9, adult: Secondary | ICD-10-CM | POA: Diagnosis not present

## 2021-08-18 DIAGNOSIS — F02A0 Dementia in other diseases classified elsewhere, mild, without behavioral disturbance, psychotic disturbance, mood disturbance, and anxiety: Secondary | ICD-10-CM | POA: Diagnosis present

## 2021-08-18 DIAGNOSIS — N400 Enlarged prostate without lower urinary tract symptoms: Secondary | ICD-10-CM | POA: Diagnosis present

## 2021-08-18 DIAGNOSIS — N138 Other obstructive and reflux uropathy: Secondary | ICD-10-CM | POA: Diagnosis present

## 2021-08-18 DIAGNOSIS — D469 Myelodysplastic syndrome, unspecified: Secondary | ICD-10-CM | POA: Diagnosis present

## 2021-08-18 DIAGNOSIS — Z8249 Family history of ischemic heart disease and other diseases of the circulatory system: Secondary | ICD-10-CM

## 2021-08-18 DIAGNOSIS — F0280 Dementia in other diseases classified elsewhere without behavioral disturbance: Secondary | ICD-10-CM | POA: Diagnosis present

## 2021-08-18 DIAGNOSIS — Z7982 Long term (current) use of aspirin: Secondary | ICD-10-CM

## 2021-08-18 DIAGNOSIS — Z20822 Contact with and (suspected) exposure to covid-19: Secondary | ICD-10-CM | POA: Diagnosis present

## 2021-08-18 DIAGNOSIS — C9141 Hairy cell leukemia, in remission: Secondary | ICD-10-CM | POA: Diagnosis present

## 2021-08-18 DIAGNOSIS — W010XXA Fall on same level from slipping, tripping and stumbling without subsequent striking against object, initial encounter: Secondary | ICD-10-CM | POA: Diagnosis present

## 2021-08-18 DIAGNOSIS — M4856XA Collapsed vertebra, not elsewhere classified, lumbar region, initial encounter for fracture: Principal | ICD-10-CM | POA: Diagnosis present

## 2021-08-18 DIAGNOSIS — Y92121 Bathroom in nursing home as the place of occurrence of the external cause: Secondary | ICD-10-CM | POA: Diagnosis not present

## 2021-08-18 DIAGNOSIS — Z87891 Personal history of nicotine dependence: Secondary | ICD-10-CM

## 2021-08-18 DIAGNOSIS — E785 Hyperlipidemia, unspecified: Secondary | ICD-10-CM | POA: Diagnosis present

## 2021-08-18 DIAGNOSIS — N390 Urinary tract infection, site not specified: Secondary | ICD-10-CM | POA: Diagnosis present

## 2021-08-18 DIAGNOSIS — Z803 Family history of malignant neoplasm of breast: Secondary | ICD-10-CM | POA: Diagnosis not present

## 2021-08-18 DIAGNOSIS — Z79899 Other long term (current) drug therapy: Secondary | ICD-10-CM | POA: Diagnosis not present

## 2021-08-18 DIAGNOSIS — E669 Obesity, unspecified: Secondary | ICD-10-CM | POA: Diagnosis present

## 2021-08-18 DIAGNOSIS — W19XXXA Unspecified fall, initial encounter: Secondary | ICD-10-CM

## 2021-08-18 DIAGNOSIS — E860 Dehydration: Secondary | ICD-10-CM | POA: Diagnosis present

## 2021-08-18 DIAGNOSIS — Z882 Allergy status to sulfonamides status: Secondary | ICD-10-CM | POA: Diagnosis not present

## 2021-08-18 DIAGNOSIS — N1831 Chronic kidney disease, stage 3a: Secondary | ICD-10-CM | POA: Diagnosis present

## 2021-08-18 DIAGNOSIS — N401 Enlarged prostate with lower urinary tract symptoms: Secondary | ICD-10-CM | POA: Diagnosis present

## 2021-08-18 DIAGNOSIS — G309 Alzheimer's disease, unspecified: Secondary | ICD-10-CM | POA: Diagnosis present

## 2021-08-18 DIAGNOSIS — D4620 Refractory anemia with excess of blasts, unspecified: Secondary | ICD-10-CM | POA: Diagnosis present

## 2021-08-18 DIAGNOSIS — Z7985 Long-term (current) use of injectable non-insulin antidiabetic drugs: Secondary | ICD-10-CM | POA: Diagnosis not present

## 2021-08-18 DIAGNOSIS — S0101XA Laceration without foreign body of scalp, initial encounter: Secondary | ICD-10-CM | POA: Diagnosis present

## 2021-08-18 DIAGNOSIS — S32020A Wedge compression fracture of second lumbar vertebra, initial encounter for closed fracture: Secondary | ICD-10-CM | POA: Diagnosis present

## 2021-08-18 DIAGNOSIS — I129 Hypertensive chronic kidney disease with stage 1 through stage 4 chronic kidney disease, or unspecified chronic kidney disease: Secondary | ICD-10-CM | POA: Diagnosis present

## 2021-08-18 DIAGNOSIS — S0191XA Laceration without foreign body of unspecified part of head, initial encounter: Secondary | ICD-10-CM | POA: Diagnosis present

## 2021-08-18 DIAGNOSIS — S32029A Unspecified fracture of second lumbar vertebra, initial encounter for closed fracture: Principal | ICD-10-CM | POA: Diagnosis present

## 2021-08-18 DIAGNOSIS — R531 Weakness: Secondary | ICD-10-CM

## 2021-08-18 DIAGNOSIS — Z833 Family history of diabetes mellitus: Secondary | ICD-10-CM

## 2021-08-18 DIAGNOSIS — Z888 Allergy status to other drugs, medicaments and biological substances status: Secondary | ICD-10-CM

## 2021-08-18 DIAGNOSIS — N183 Chronic kidney disease, stage 3 unspecified: Secondary | ICD-10-CM | POA: Diagnosis present

## 2021-08-18 DIAGNOSIS — Z85828 Personal history of other malignant neoplasm of skin: Secondary | ICD-10-CM | POA: Diagnosis not present

## 2021-08-18 DIAGNOSIS — I10 Essential (primary) hypertension: Secondary | ICD-10-CM | POA: Diagnosis present

## 2021-08-18 DIAGNOSIS — E1122 Type 2 diabetes mellitus with diabetic chronic kidney disease: Secondary | ICD-10-CM | POA: Diagnosis present

## 2021-08-18 DIAGNOSIS — D46Z Other myelodysplastic syndromes: Secondary | ICD-10-CM | POA: Diagnosis present

## 2021-08-18 HISTORY — DX: Dementia in other diseases classified elsewhere, unspecified severity, without behavioral disturbance, psychotic disturbance, mood disturbance, and anxiety: F02.80

## 2021-08-18 LAB — RESP PANEL BY RT-PCR (FLU A&B, COVID) ARPGX2
Influenza A by PCR: NEGATIVE
Influenza B by PCR: NEGATIVE
SARS Coronavirus 2 by RT PCR: NEGATIVE

## 2021-08-18 LAB — COMPREHENSIVE METABOLIC PANEL
ALT: 17 U/L (ref 0–44)
AST: 16 U/L (ref 15–41)
Albumin: 2.9 g/dL — ABNORMAL LOW (ref 3.5–5.0)
Alkaline Phosphatase: 89 U/L (ref 38–126)
Anion gap: 6 (ref 5–15)
BUN: 21 mg/dL (ref 8–23)
CO2: 23 mmol/L (ref 22–32)
Calcium: 8 mg/dL — ABNORMAL LOW (ref 8.9–10.3)
Chloride: 105 mmol/L (ref 98–111)
Creatinine, Ser: 1.31 mg/dL — ABNORMAL HIGH (ref 0.61–1.24)
GFR, Estimated: 54 mL/min — ABNORMAL LOW (ref >60)
Glucose, Bld: 235 mg/dL — ABNORMAL HIGH (ref 70–99)
Potassium: 3.6 mmol/L (ref 3.5–5.1)
Sodium: 134 mmol/L — ABNORMAL LOW (ref 135–145)
Total Bilirubin: 1.1 mg/dL (ref 0.3–1.2)
Total Protein: 6.4 g/dL — ABNORMAL LOW (ref 6.5–8.1)

## 2021-08-18 LAB — URINALYSIS, COMPLETE (UACMP) WITH MICROSCOPIC
Bilirubin Urine: NEGATIVE
Glucose, UA: >=500 mg/dL — AB
Hgb urine dipstick: NEGATIVE
Ketones, ur: NEGATIVE mg/dL
Nitrite: NEGATIVE
Protein, ur: 30 mg/dL — AB
RBC / HPF: >50 RBC/hpf — ABNORMAL HIGH (ref 0–5)
Specific Gravity, Urine: 1.014 (ref 1.005–1.030)
WBC, UA: >50 WBC/hpf — ABNORMAL HIGH (ref 0–5)
pH: 5 (ref 5.0–8.0)

## 2021-08-18 LAB — CBC WITH DIFFERENTIAL/PLATELET
Abs Immature Granulocytes: 0.01 10*3/uL (ref 0.00–0.07)
Basophils Absolute: 0 10*3/uL (ref 0.0–0.1)
Basophils Relative: 1 %
Eosinophils Absolute: 0.1 10*3/uL (ref 0.0–0.5)
Eosinophils Relative: 3 %
HCT: 25.1 % — ABNORMAL LOW (ref 39.0–52.0)
Hemoglobin: 8.1 g/dL — ABNORMAL LOW (ref 13.0–17.0)
Immature Granulocytes: 0 %
Lymphocytes Relative: 26 %
Lymphs Abs: 1 10*3/uL (ref 0.7–4.0)
MCH: 34 pg (ref 26.0–34.0)
MCHC: 32.3 g/dL (ref 30.0–36.0)
MCV: 105.5 fL — ABNORMAL HIGH (ref 80.0–100.0)
Monocytes Absolute: 0.2 10*3/uL (ref 0.1–1.0)
Monocytes Relative: 5 %
Neutro Abs: 2.6 10*3/uL (ref 1.7–7.7)
Neutrophils Relative %: 65 %
Platelets: 121 10*3/uL — ABNORMAL LOW (ref 150–400)
RBC: 2.38 MIL/uL — ABNORMAL LOW (ref 4.22–5.81)
RDW: 14.9 % (ref 11.5–15.5)
WBC: 4 10*3/uL (ref 4.0–10.5)
nRBC: 0 % (ref 0.0–0.2)

## 2021-08-18 LAB — TROPONIN I (HIGH SENSITIVITY): Troponin I (High Sensitivity): 15 ng/L (ref <18)

## 2021-08-18 LAB — CBG MONITORING, ED: Glucose-Capillary: 228 mg/dL — ABNORMAL HIGH (ref 70–99)

## 2021-08-18 LAB — PROTIME-INR
INR: 1.1 (ref 0.8–1.2)
Prothrombin Time: 13.7 seconds (ref 11.4–15.2)

## 2021-08-18 LAB — GLUCOSE, CAPILLARY: Glucose-Capillary: 286 mg/dL — ABNORMAL HIGH (ref 70–99)

## 2021-08-18 IMAGING — CT CT HEAD W/O CM
4 series · 15 of 47 positions shown, 17 images · non-contrast
Comparison: No priors.

CLINICAL DATA: 86-year-old male with history of head trauma from a
fall.

EXAM:
CT HEAD WITHOUT CONTRAST
CT CERVICAL SPINE WITHOUT CONTRAST
TECHNIQUE: Multidetector CT imaging of the head and cervical spine was
performed following the standard protocol without intravenous
contrast. Multiplanar CT image reconstructions of the cervical spine
were also generated.

[Series 2: head wo · axial · 0.43mm/px · z∈[-62,+58]mm · 7 of 33 slices shown, 9 images]
[im 5/33  brain]
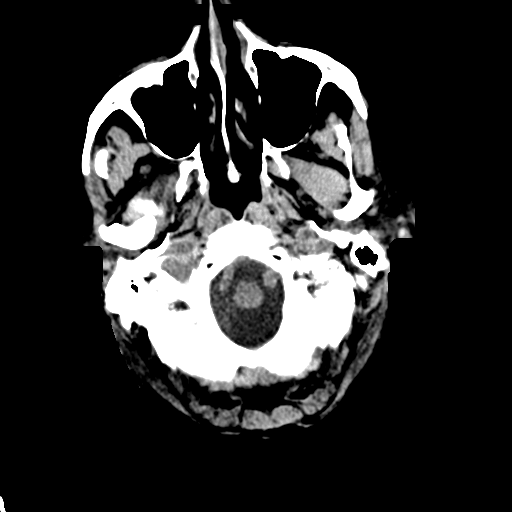
[im 5/33  bone]
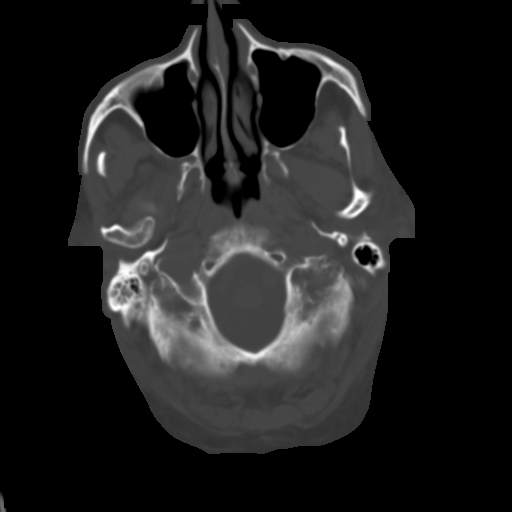
[im 9/33  brain]
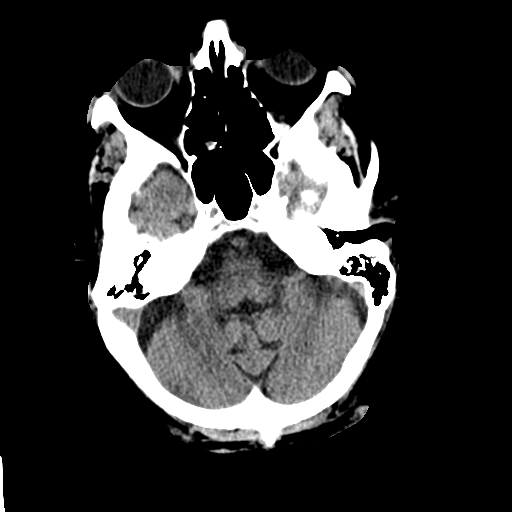
[im 13/33  brain]
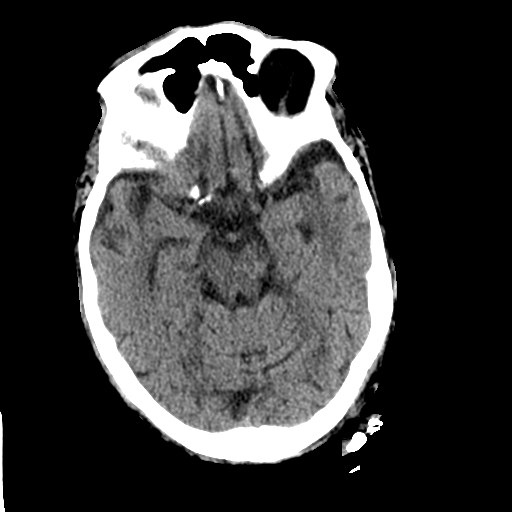
[im 17/33  brain]
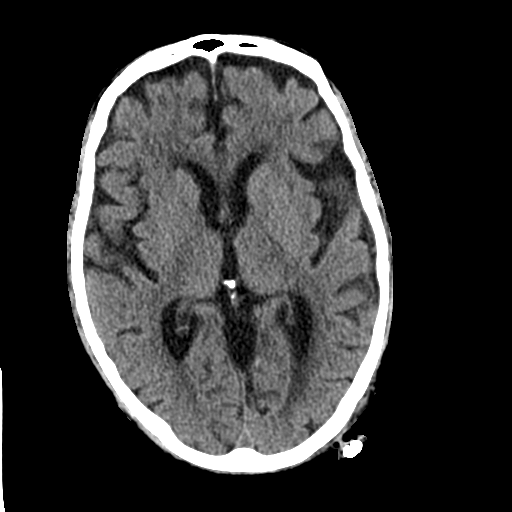
[im 21/33  brain]
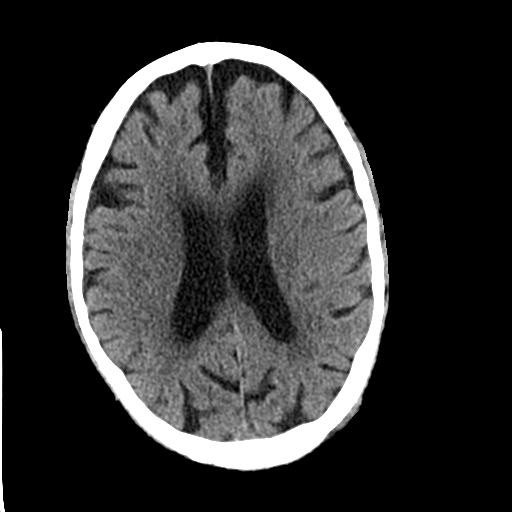
[im 21/33  bone]
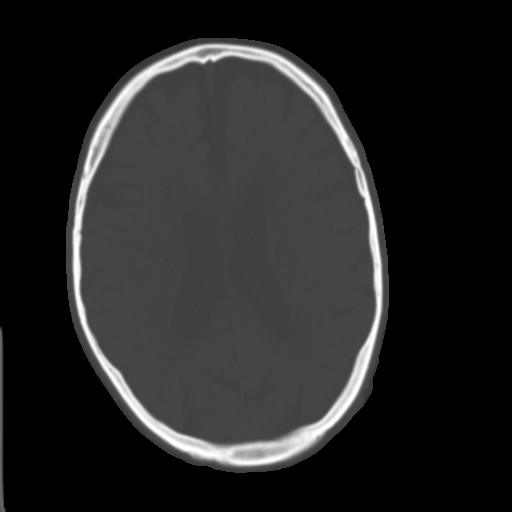
[im 25/33  brain]
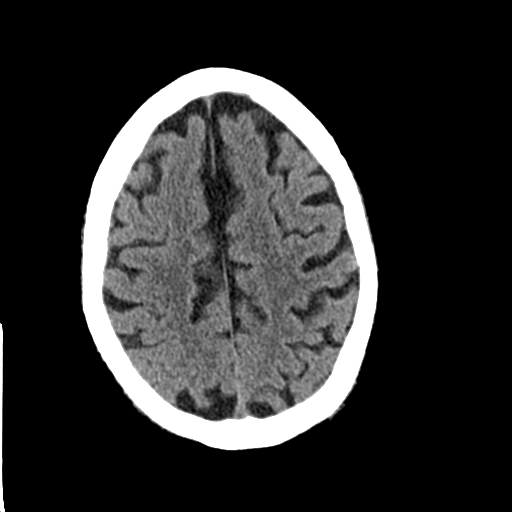
[im 29/33  brain]
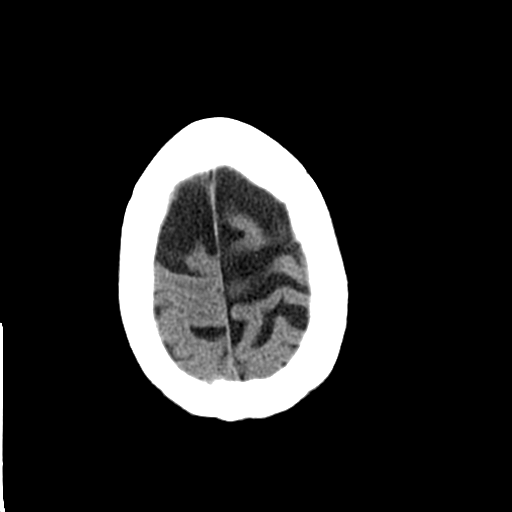

[Series 3: head bone · axial · 0.43mm/px · z∈[-66,-50]mm · 2 of 81 slices shown]
[im 9/81  bone]
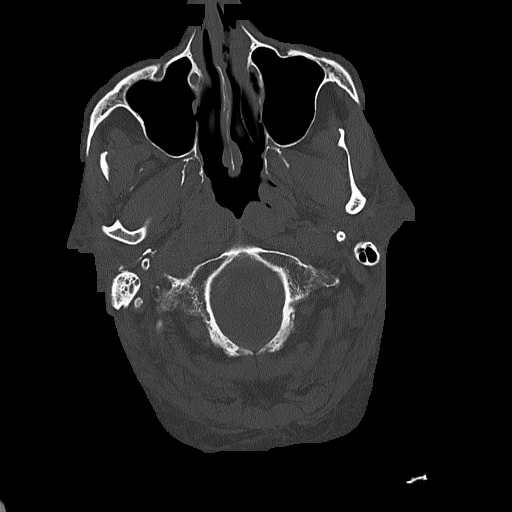
[im 17/81  bone]
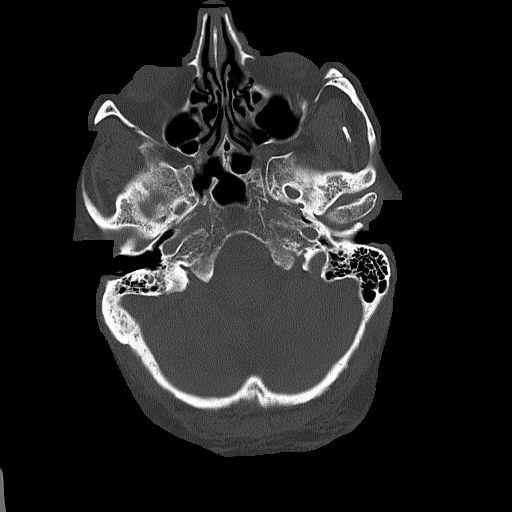

[Series 4: coronal soft tissue · coronal · 0.32mm/px · 3 of 74 slices shown]
[im 25/74  brain]
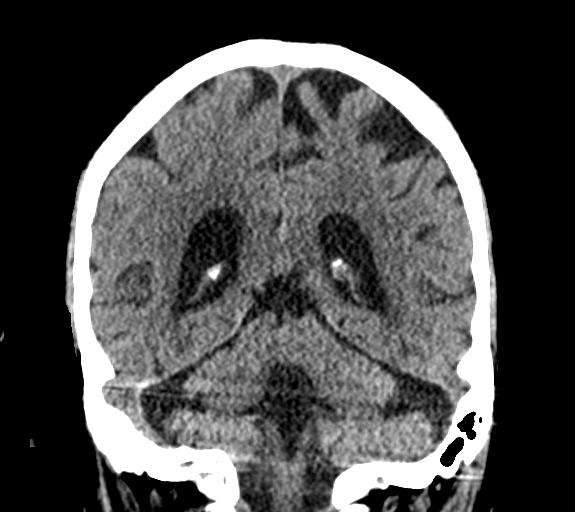
[im 33/74  brain]
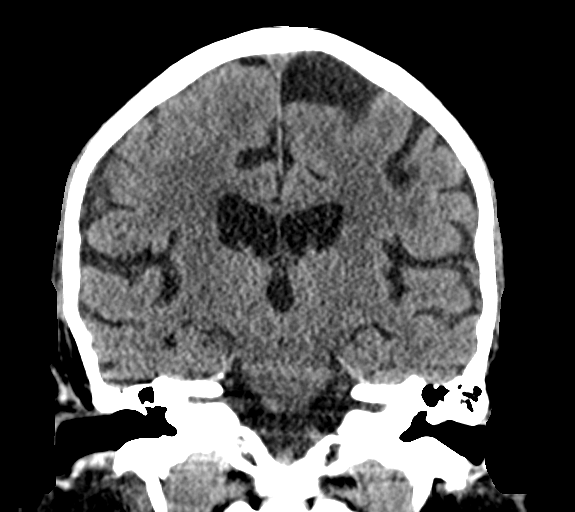
[im 41/74  brain]
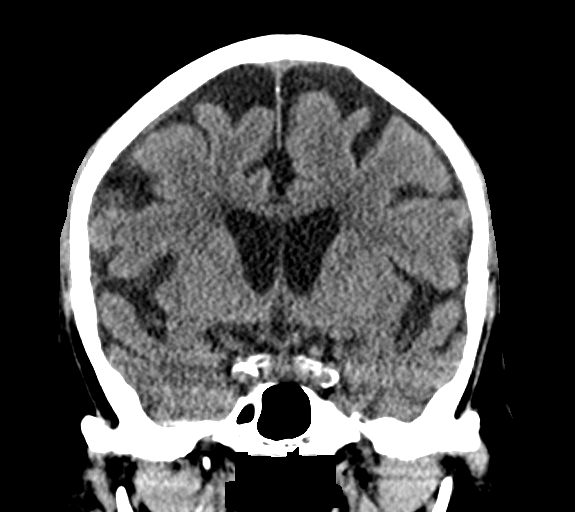

[Series 5: sagittal soft tissue · sagittal · 0.32mm/px · 3 of 61 slices shown]
[im 21/61  brain]
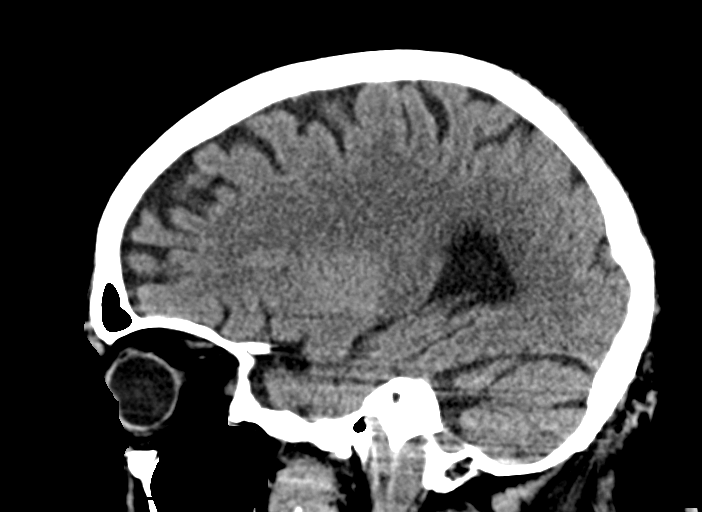
[im 31/61  brain]
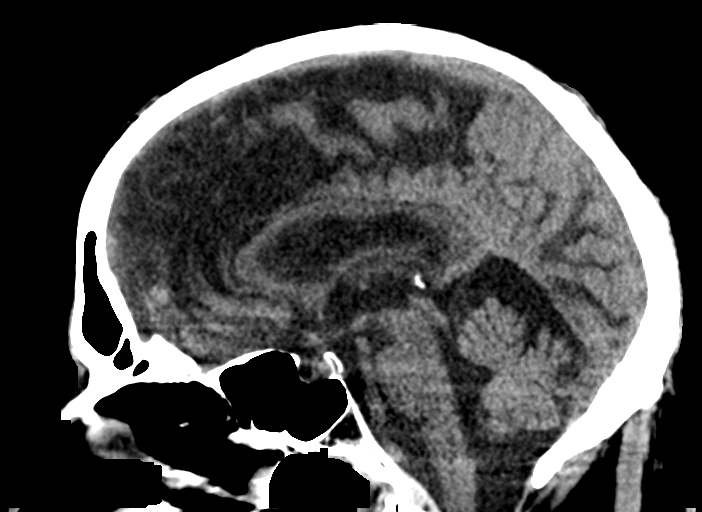
[im 41/61  brain]
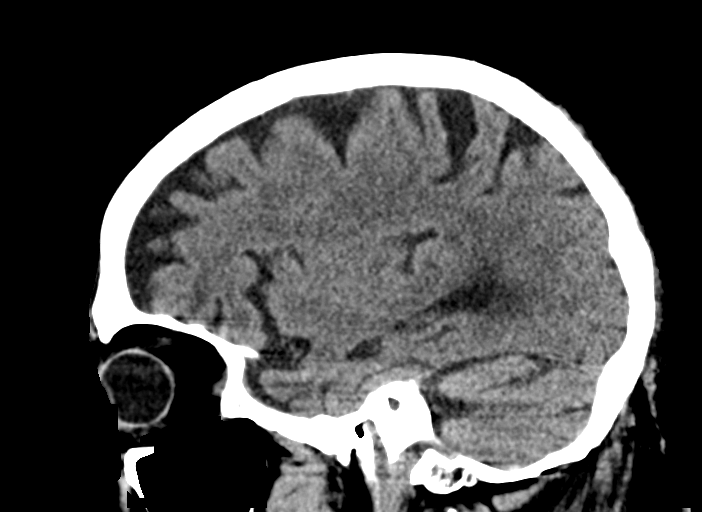

[15 of 47 positions shown; findings below may reference images not displayed]

FINDINGS: CT HEAD FINDINGS

Brain: Moderate cerebral and cerebellar atrophy. Patchy and
confluent areas of decreased attenuation are noted throughout the
deep and periventricular white matter of the cerebral hemispheres
bilaterally, compatible with chronic microvascular ischemic disease.
No evidence of acute infarction, hemorrhage, hydrocephalus,
extra-axial collection or mass lesion/mass effect.

Vascular: No hyperdense vessel or unexpected calcification.

Skull: Normal. Negative for fracture or focal lesion.

Sinuses/Orbits: No acute finding.  Small R mastoid effusion.

Other: None.

CT CERVICAL SPINE FINDINGS

Alignment: Normal.

Skull base and vertebrae: No acute fracture. No primary bone lesion
or focal pathologic process.

Soft tissues and spinal canal: No prevertebral fluid or swelling. No
visible canal hematoma.

Disc levels: Multilevel degenerative disc disease, most pronounced
at C4-C5. Mild multilevel facet arthropathy.

Upper chest: Negative.

Other: None.
IMPRESSION: 1. No evidence of significant acute traumatic injury to the skull,
brain or cervical spine.
2. Moderate cerebral and cerebellar atrophy with chronic
microvascular ischemic changes in the cerebral white matter, as
above.
3. Small right mastoid effusion.
4. Multilevel degenerative disc disease and cervical spondylosis, as
above.

## 2021-08-18 IMAGING — CT CT L SPINE W/O CM
4 of 5 series · 16 of 33 positions shown, 17 images · non-contrast
Comparison: Nine days ago

CLINICAL DATA: Low back pain.  Trauma.

EXAM:
CT LUMBAR SPINE WITHOUT CONTRAST
TECHNIQUE: Multidetector CT imaging of the lumbar spine was performed without
intravenous contrast administration. Multiplanar CT image
reconstructions were also generated.

[Series 4: l spine soft · axial · 0.44mm/px · z∈[-999,-873]mm · 4 of 147 slices shown]
[im 21/147  soft-tissue]
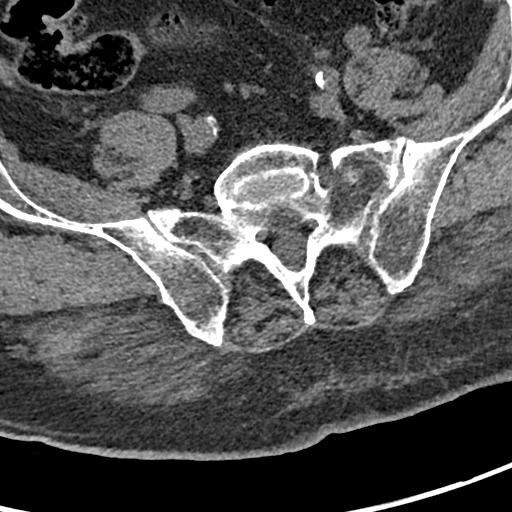
[im 42/147  soft-tissue]
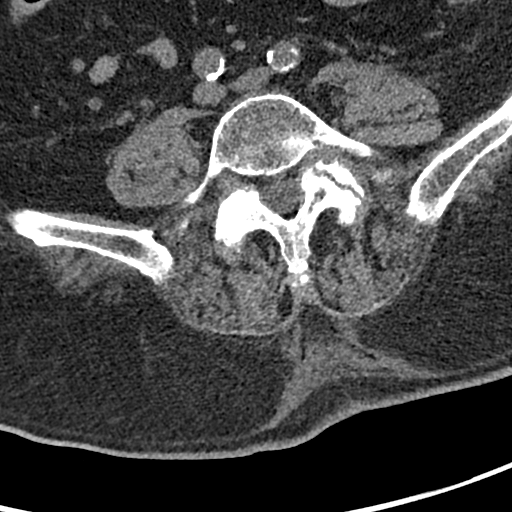
[im 63/147  soft-tissue]
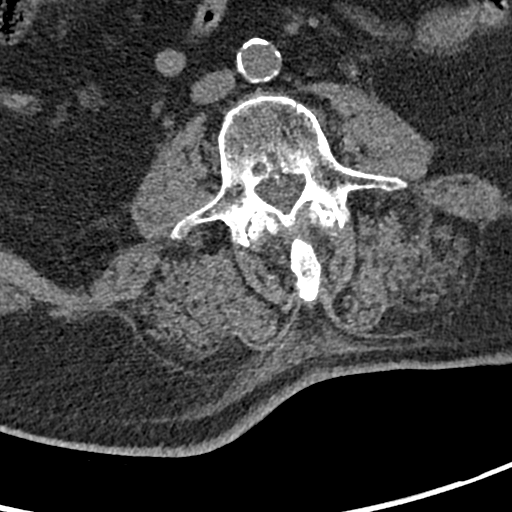
[im 84/147  soft-tissue]
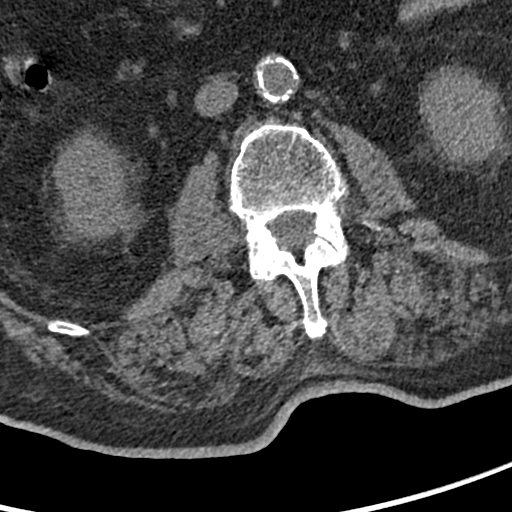

[Series 5: sagittal bone · sagittal · 0.48mm/px · 5 of 196 slices shown]
[im 33/196  bone]
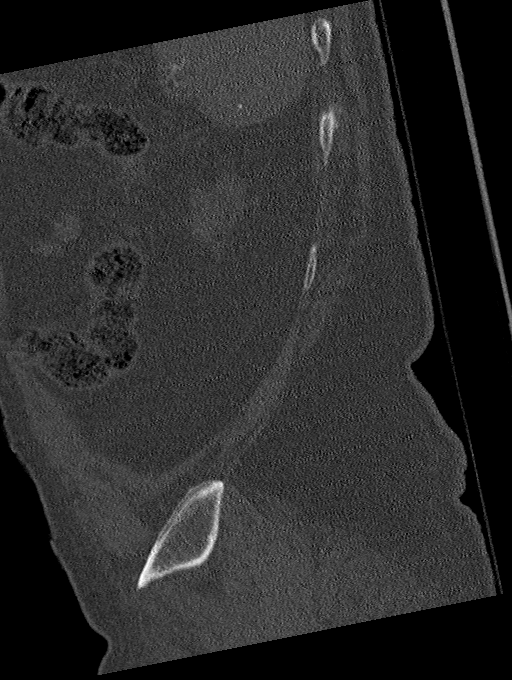
[im 66/196  bone]
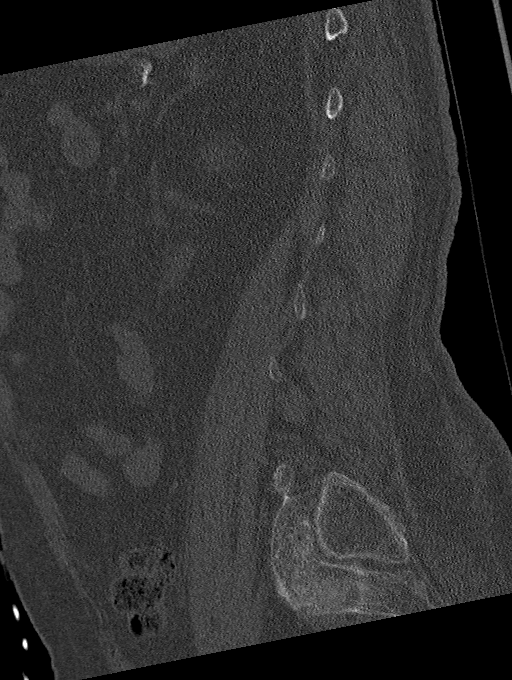
[im 98/196  bone]
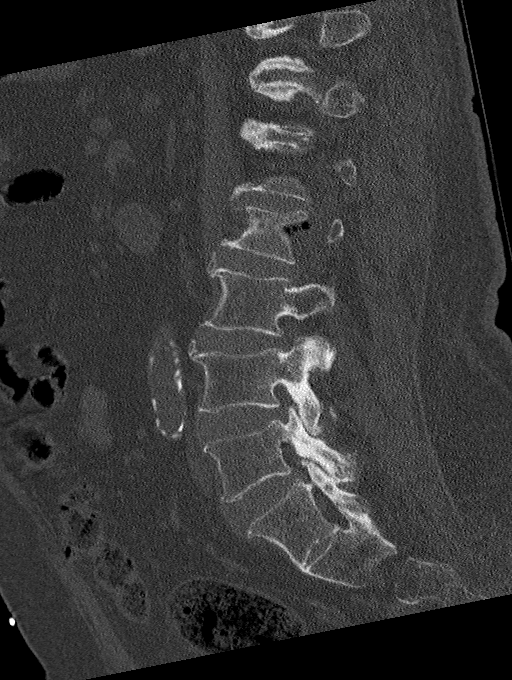
[im 131/196  bone]
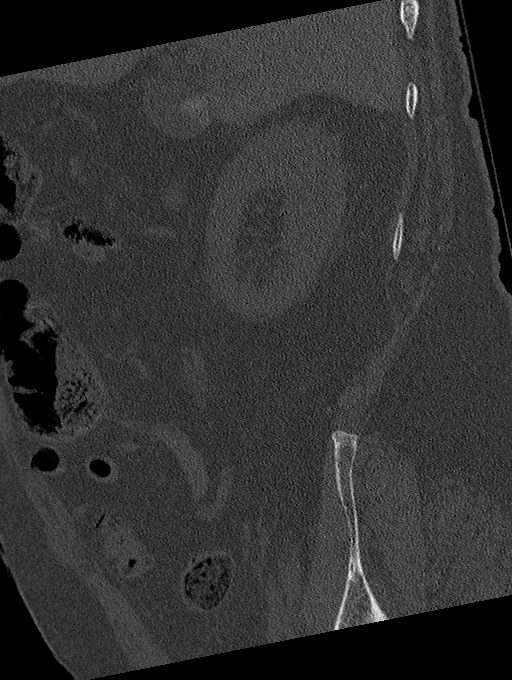
[im 163/196  bone]
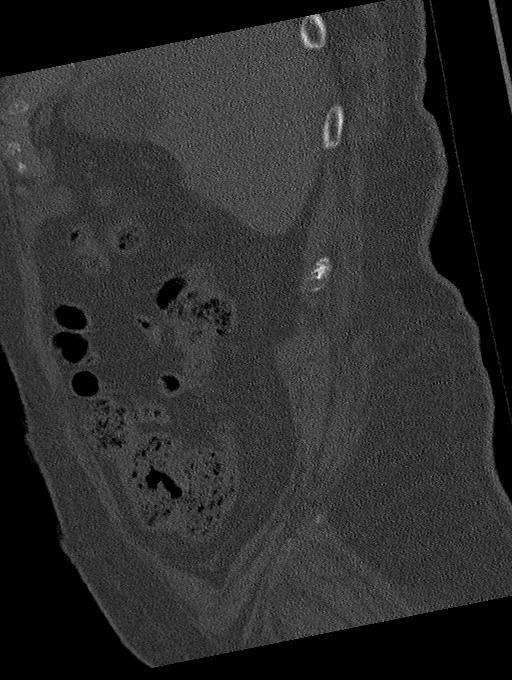

[Series 7: coronal bone · coronal · 0.56mm/px · 3 of 154 slices shown]
[im 31/154  bone]
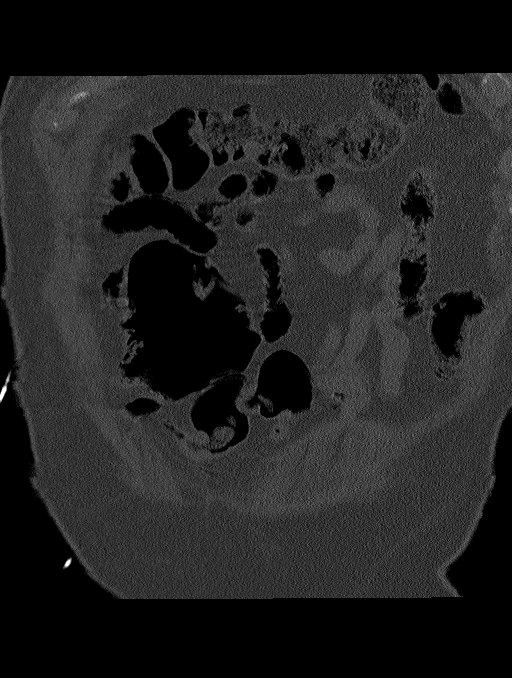
[im 62/154  bone]
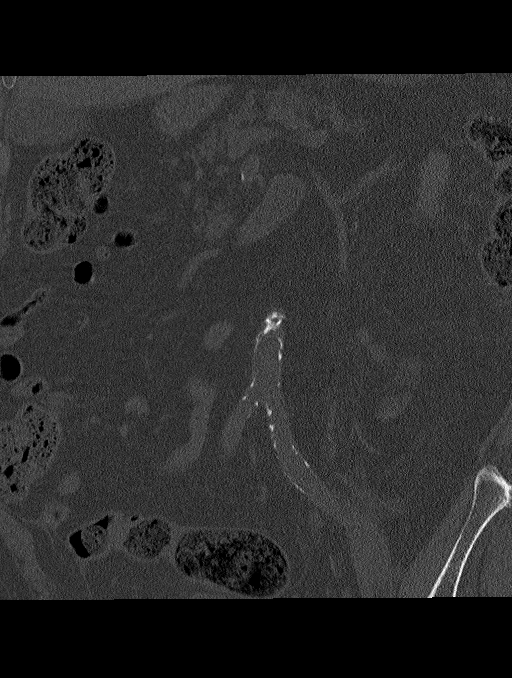
[im 92/154  bone]
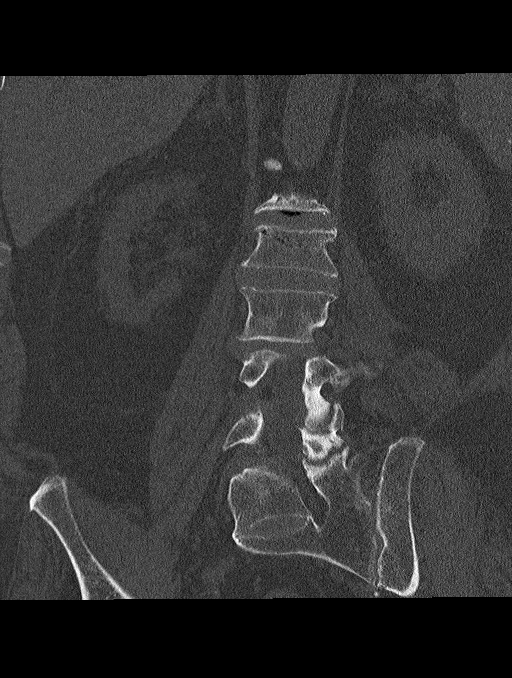

[Series 8: multi disc · axial · 0.21mm/px · z∈[-952,-793]mm · 4 of 119 slices shown, 5 images]
[im 24/119  soft-tissue]
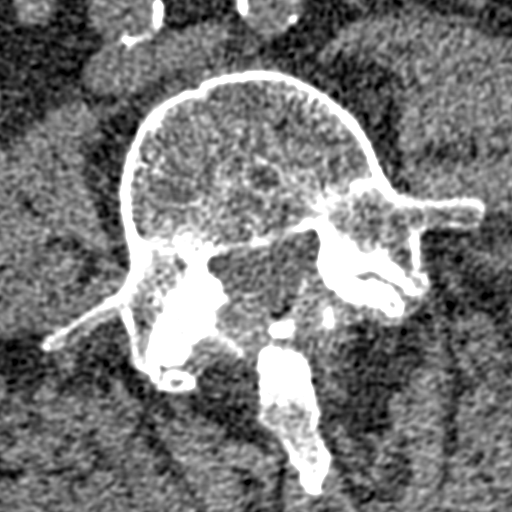
[im 24/119  bone]
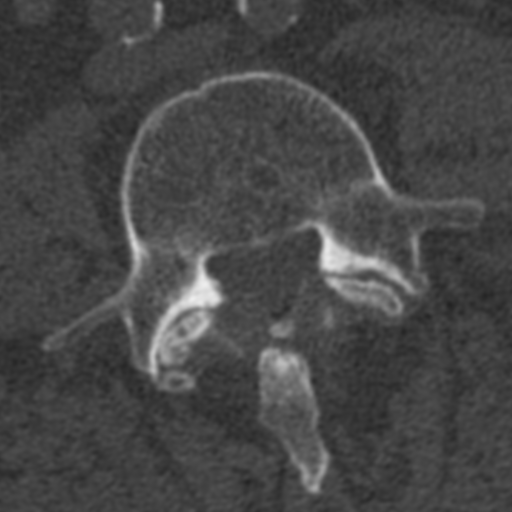
[im 48/119  bone]
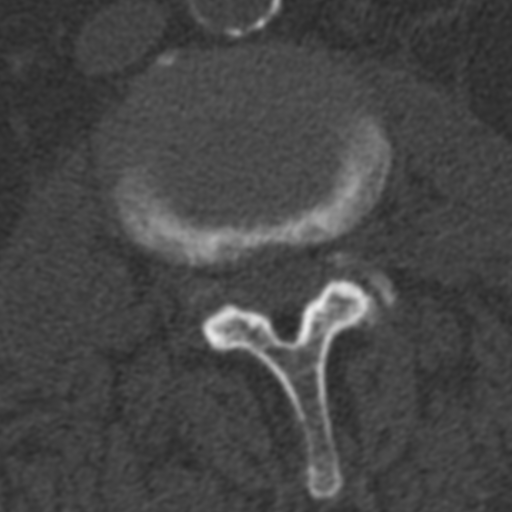
[im 71/119  bone]
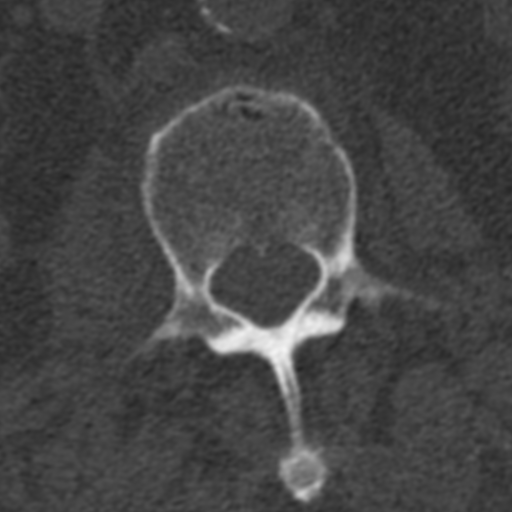
[im 95/119  bone]
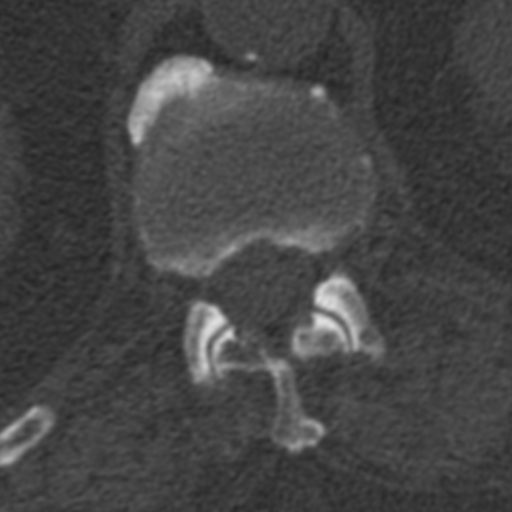

[16 of 33 positions shown; findings below may reference images not displayed]

FINDINGS: Segmentation: Rudimentary disc space at S1-2.

Alignment: Physiologic

Vertebrae: Band of sclerosis at the L1 body consistent with recent
fracture. Height loss is minimal. No retropulsion.

Horizontal fracture cleft through the L2 body containing gas which
extends right paravertebral. Mild paravertebral edema at the same
level. Height loss is mild. No retropulsion.

No evidence of aggressive bone lesion.

Paraspinal and other soft tissues: Cholelithiasis. Diffuse
atherosclerosis.

Disc levels: Mild lumbar spine disc space narrowing and endplate
ridging for age. Facet osteoarthritis with spurring at L3-4 and
below. Moderate appearing spinal stenosis at L4-5
IMPRESSION: 1. Acute L2 compression fracture with mild height loss.
2. More subacute appearing L1 compression fracture with mild height
loss.
3. Cholelithiasis.

## 2021-08-18 IMAGING — CT CT CERVICAL SPINE W/O CM
3 of 4 series · 12 of 35 positions shown, 14 images · non-contrast
Comparison: No priors.

CLINICAL DATA: 86-year-old male with history of head trauma from a
fall.

EXAM:
CT HEAD WITHOUT CONTRAST
CT CERVICAL SPINE WITHOUT CONTRAST
TECHNIQUE: Multidetector CT imaging of the head and cervical spine was
performed following the standard protocol without intravenous
contrast. Multiplanar CT image reconstructions of the cervical spine
were also generated.

[Series 4: sagittal bone · sagittal · 0.53mm/px · 5 of 215 slices shown, 6 images]
[im 72/215  bone]
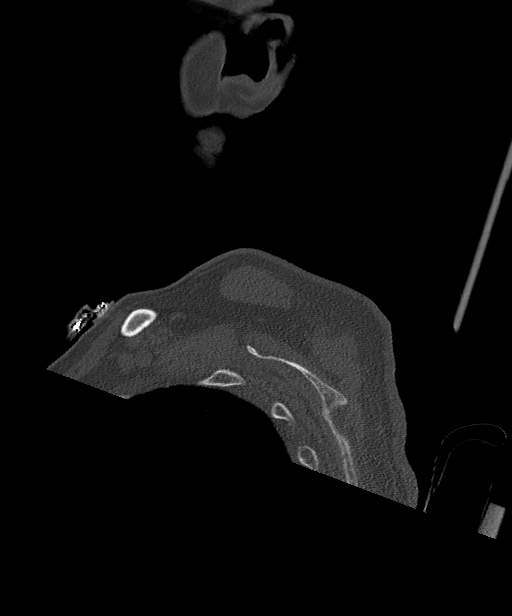
[im 90/215  bone]
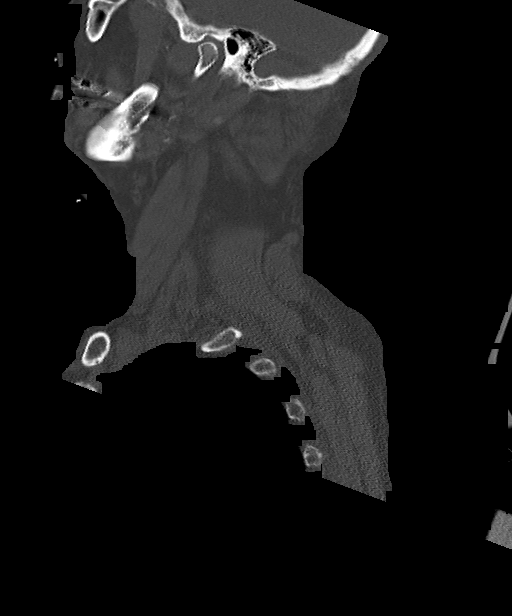
[im 108/215  soft-tissue]
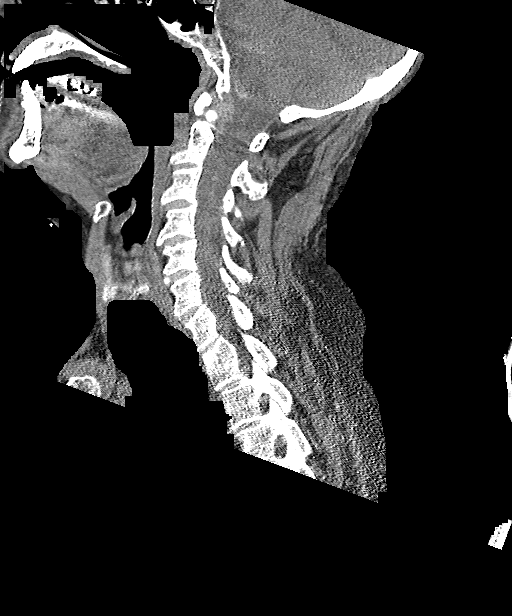
[im 108/215  bone]
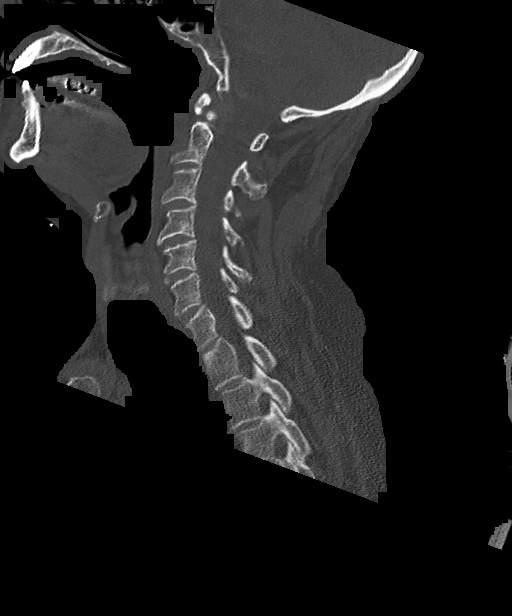
[im 125/215  bone]
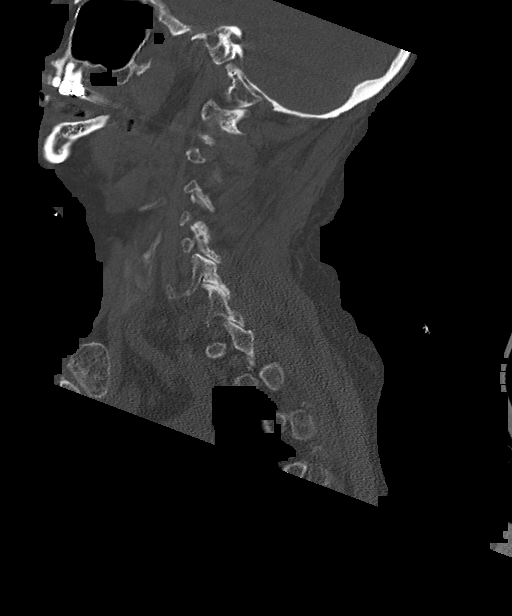
[im 143/215  bone]
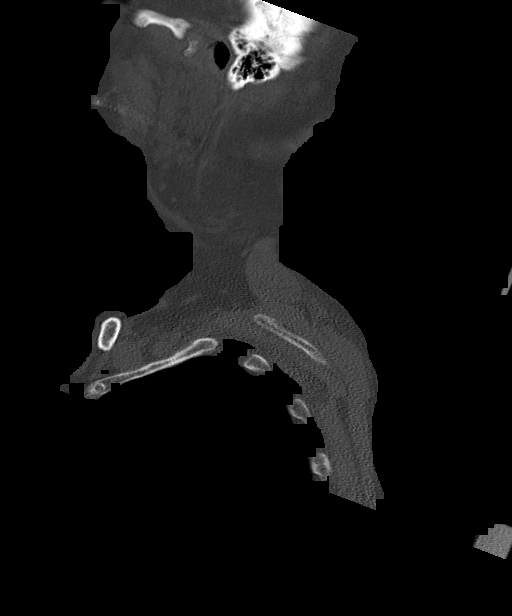

[Series 5: coronal bone · coronal · 0.53mm/px · 3 of 154 slices shown]
[im 31/154  bone]
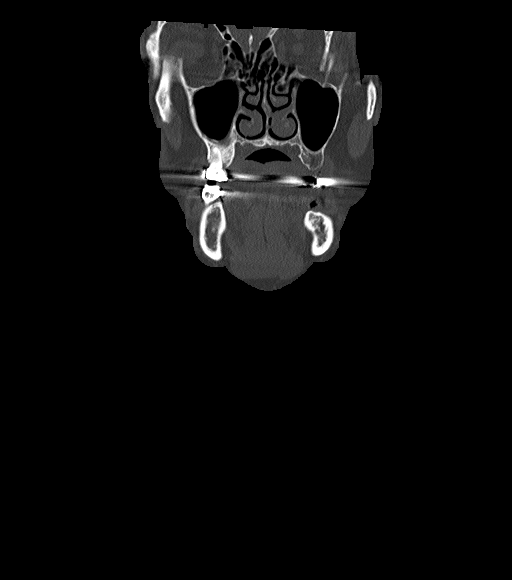
[im 62/154  bone]
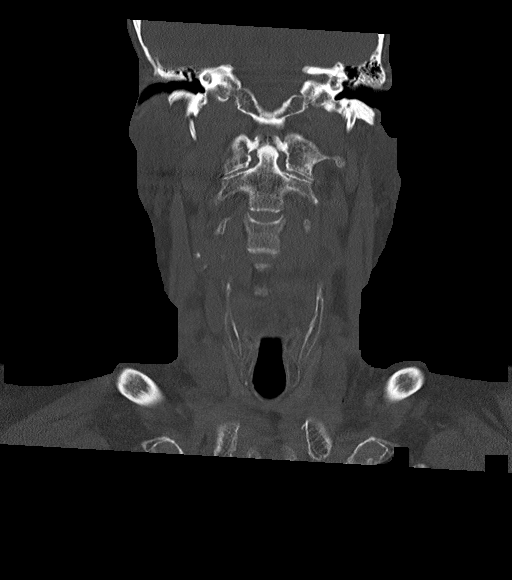
[im 92/154  bone]
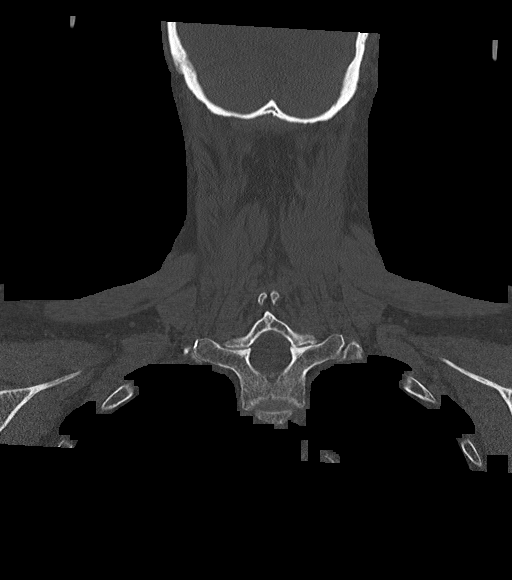

[Series 6: orthogonal bone · axial · 0.60mm/px · z∈[-243,-80]mm · 4 of 124 slices shown, 5 images]
[im 21/124  soft-tissue]
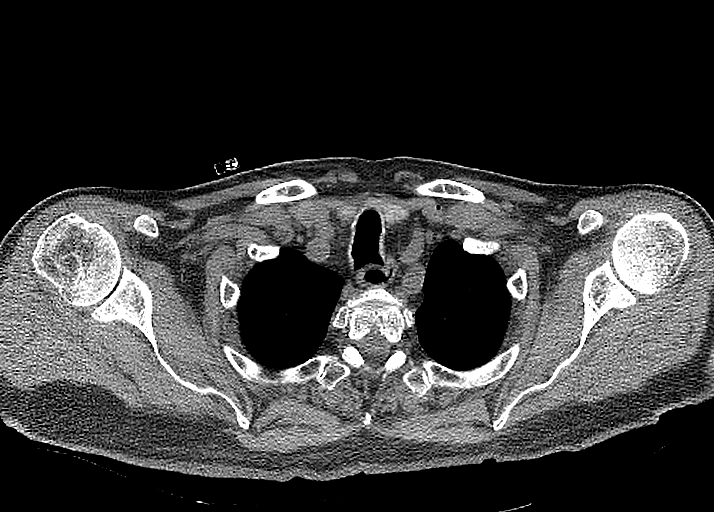
[im 21/124  bone]
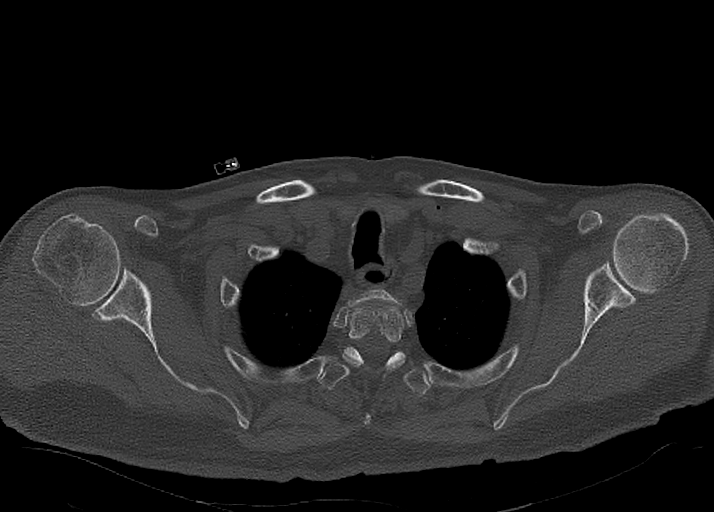
[im 42/124  bone]
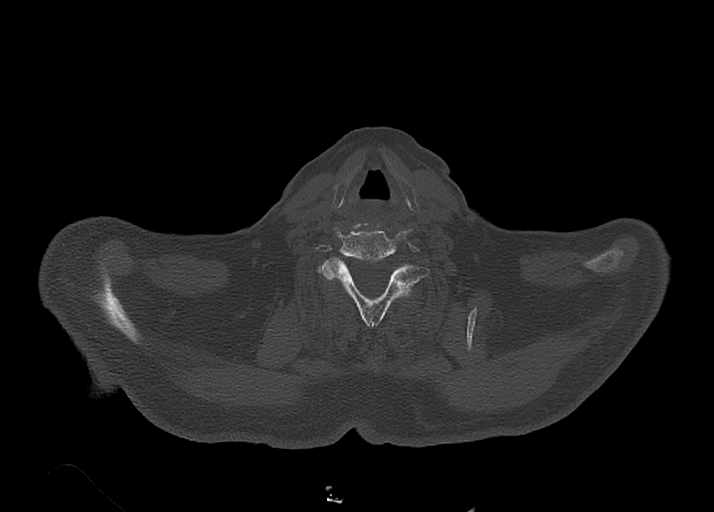
[im 83/124  bone]
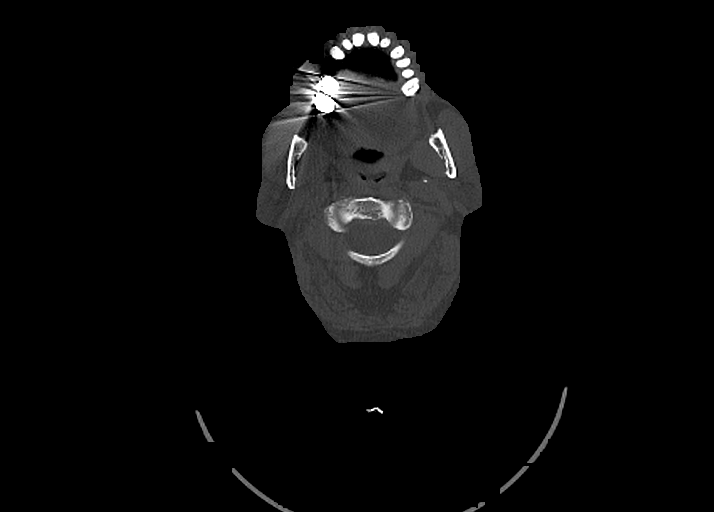
[im 103/124  bone]
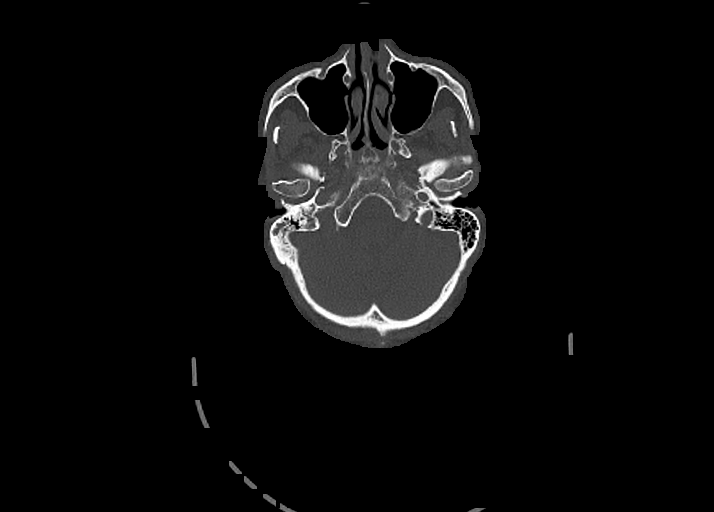

[12 of 35 positions shown; findings below may reference images not displayed]

FINDINGS: CT HEAD FINDINGS

Brain: Moderate cerebral and cerebellar atrophy. Patchy and
confluent areas of decreased attenuation are noted throughout the
deep and periventricular white matter of the cerebral hemispheres
bilaterally, compatible with chronic microvascular ischemic disease.
No evidence of acute infarction, hemorrhage, hydrocephalus,
extra-axial collection or mass lesion/mass effect.

Vascular: No hyperdense vessel or unexpected calcification.

Skull: Normal. Negative for fracture or focal lesion.

Sinuses/Orbits: No acute finding.  Small R mastoid effusion.

Other: None.

CT CERVICAL SPINE FINDINGS

Alignment: Normal.

Skull base and vertebrae: No acute fracture. No primary bone lesion
or focal pathologic process.

Soft tissues and spinal canal: No prevertebral fluid or swelling. No
visible canal hematoma.

Disc levels: Multilevel degenerative disc disease, most pronounced
at C4-C5. Mild multilevel facet arthropathy.

Upper chest: Negative.

Other: None.
IMPRESSION: 1. No evidence of significant acute traumatic injury to the skull,
brain or cervical spine.
2. Moderate cerebral and cerebellar atrophy with chronic
microvascular ischemic changes in the cerebral white matter, as
above.
3. Small right mastoid effusion.
4. Multilevel degenerative disc disease and cervical spondylosis, as
above.

## 2021-08-18 IMAGING — CR DG CHEST 1V PORT
1 series · 1 of 1 positions shown · non-contrast
Comparison: [DATE]

CLINICAL DATA: Fall after slipping and bathroom

EXAM:
PORTABLE CHEST 1 VIEW

[dg chest port 1 view]
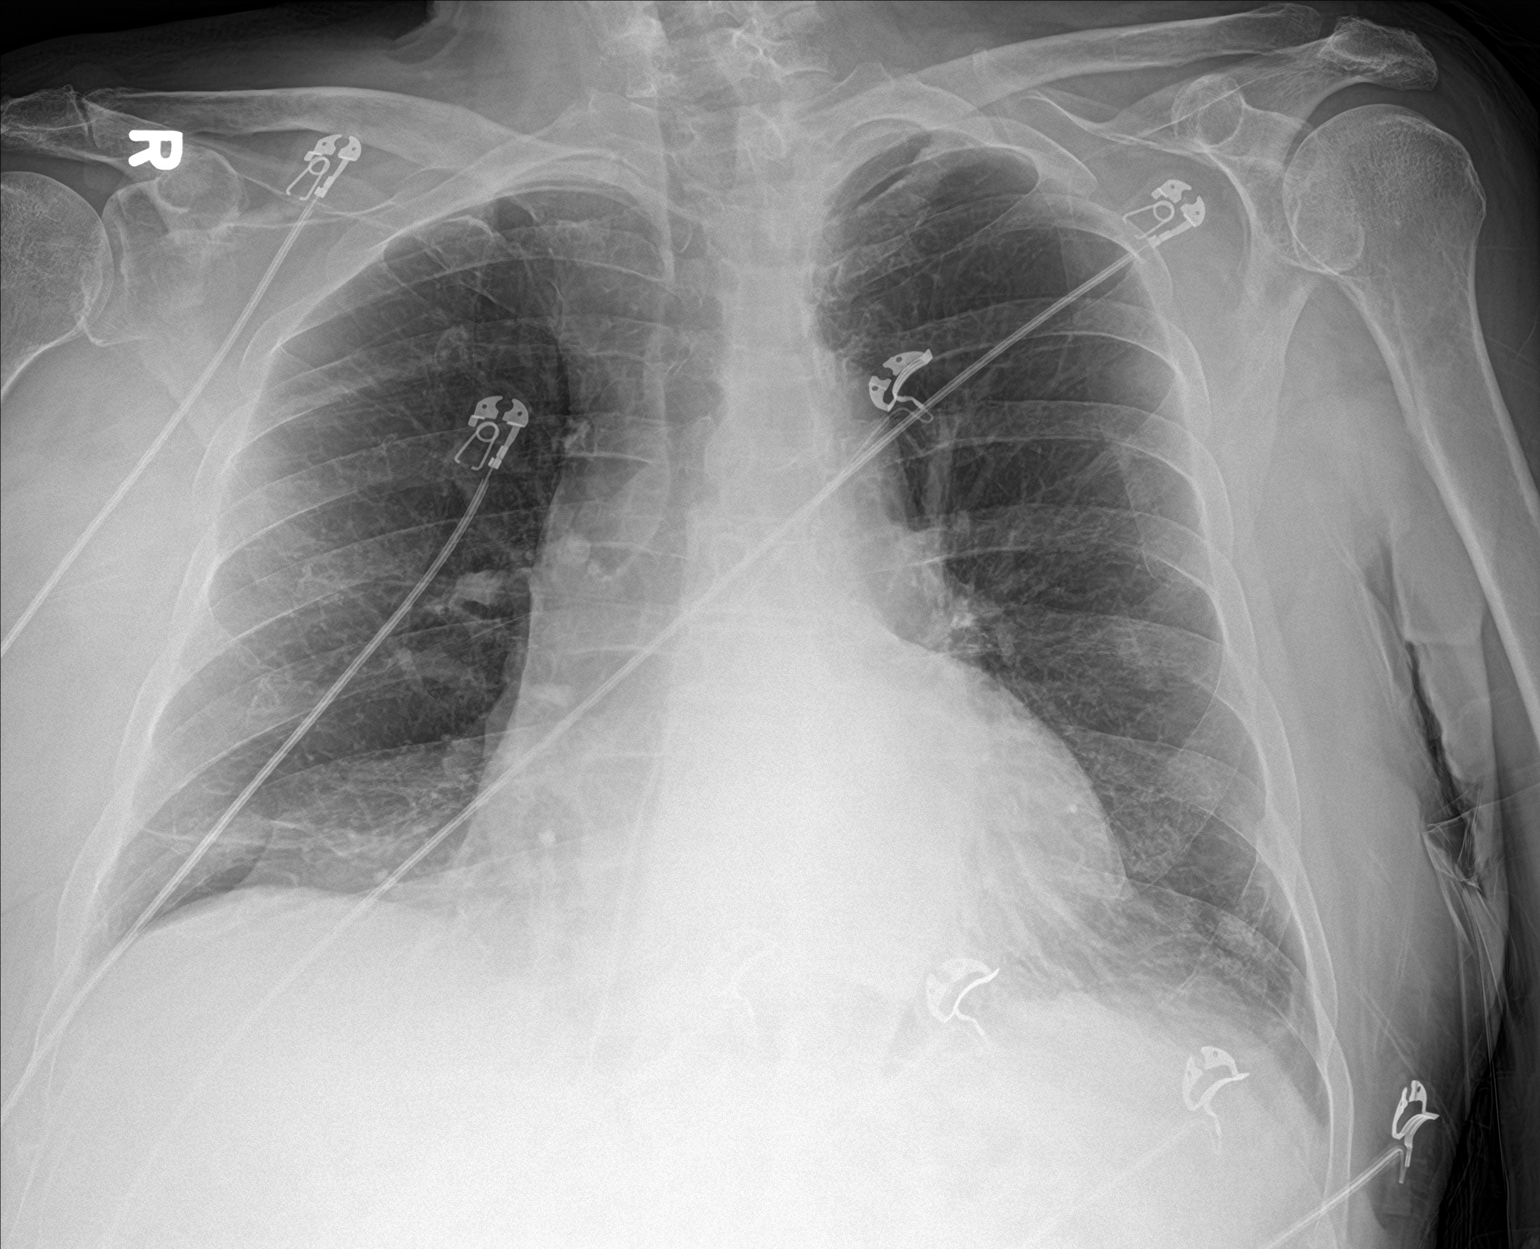

[1 of 1 positions shown; findings below may reference images not displayed]

FINDINGS: Normal heart size and mediastinal contours.

Bands of atelectasis or scarring at the lung bases. There is no
edema, consolidation, effusion, or pneumothorax.
IMPRESSION: No active disease.

## 2021-08-18 IMAGING — CR DG PORTABLE PELVIS
1 series · 1 of 1 positions shown · non-contrast
Comparison: None.

CLINICAL DATA: Fall

EXAM:
PORTABLE PELVIS 1-2 VIEWS

[view not recorded]
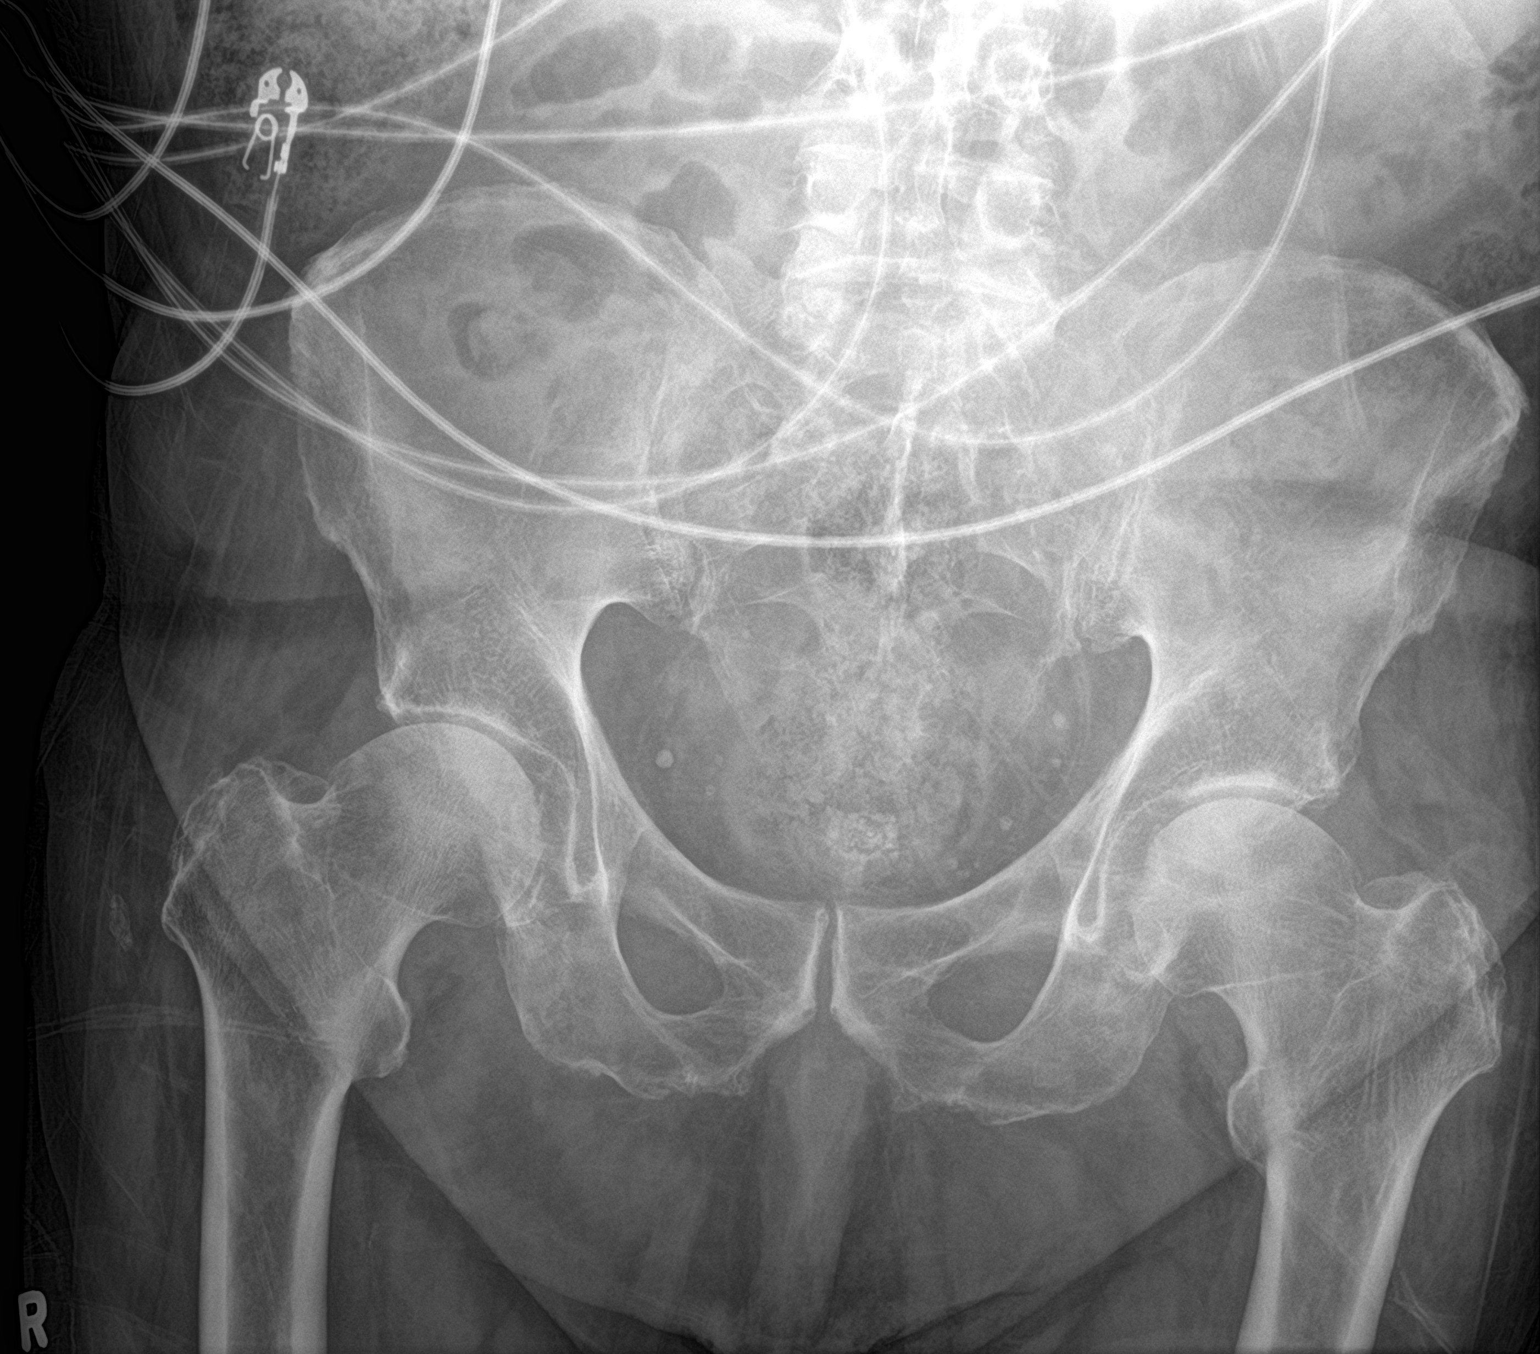

[1 of 1 positions shown; findings below may reference images not displayed]

FINDINGS: There is no evidence of pelvic fracture or diastasis. No pelvic bone
lesions are seen. Generalized osteopenia.
IMPRESSION: Negative.

## 2021-08-18 MED ORDER — ACETAMINOPHEN 500 MG PO TABS
1000.0000 mg | ORAL_TABLET | Freq: Three times a day (TID) | ORAL | Status: DC
  Administered 2021-08-18 – 2021-08-22 (×11): 1000 mg via ORAL
  Filled 2021-08-18 (×12): qty 2

## 2021-08-18 MED ORDER — SODIUM CHLORIDE 0.9 % IV BOLUS
1000.0000 mL | Freq: Once | INTRAVENOUS | Status: AC
  Administered 2021-08-18: 1000 mL via INTRAVENOUS

## 2021-08-18 MED ORDER — SODIUM CHLORIDE 0.9 % IV SOLN
1.0000 g | INTRAVENOUS | Status: DC
  Administered 2021-08-19 – 2021-08-21 (×3): 1 g via INTRAVENOUS
  Filled 2021-08-18 (×3): qty 10

## 2021-08-18 MED ORDER — FAMOTIDINE 20 MG PO TABS
20.0000 mg | ORAL_TABLET | Freq: Every day | ORAL | Status: DC
  Administered 2021-08-19 – 2021-08-22 (×4): 20 mg via ORAL
  Filled 2021-08-18 (×4): qty 1

## 2021-08-18 MED ORDER — ASPIRIN EC 81 MG PO TBEC
81.0000 mg | DELAYED_RELEASE_TABLET | Freq: Every day | ORAL | Status: DC
  Administered 2021-08-19 – 2021-08-22 (×4): 81 mg via ORAL
  Filled 2021-08-18 (×4): qty 1

## 2021-08-18 MED ORDER — LEVOFLOXACIN IN D5W 500 MG/100ML IV SOLN
500.0000 mg | Freq: Once | INTRAVENOUS | Status: AC
  Administered 2021-08-18: 500 mg via INTRAVENOUS
  Filled 2021-08-18: qty 100

## 2021-08-18 MED ORDER — LACTATED RINGERS IV SOLN: INTRAVENOUS | Status: AC

## 2021-08-18 MED ORDER — PRAVASTATIN SODIUM 20 MG PO TABS
20.0000 mg | ORAL_TABLET | Freq: Every evening | ORAL | Status: DC
  Administered 2021-08-19 – 2021-08-21 (×3): 20 mg via ORAL
  Filled 2021-08-18 (×3): qty 1

## 2021-08-18 MED ORDER — CALCITONIN (SALMON) 200 UNIT/ACT NA SOLN
1.0000 | Freq: Every day | NASAL | Status: DC
  Administered 2021-08-18 – 2021-08-22 (×5): 1 via NASAL
  Filled 2021-08-18 (×2): qty 3.7

## 2021-08-18 MED ORDER — INSULIN ASPART 100 UNIT/ML IJ SOLN
0.0000 [IU] | Freq: Three times a day (TID) | INTRAMUSCULAR | Status: DC
  Administered 2021-08-19: 5 [IU] via SUBCUTANEOUS
  Administered 2021-08-19: 3 [IU] via SUBCUTANEOUS
  Filled 2021-08-18 (×2): qty 1

## 2021-08-18 MED ORDER — ONDANSETRON HCL 4 MG PO TABS
4.0000 mg | ORAL_TABLET | Freq: Four times a day (QID) | ORAL | Status: DC | PRN
  Administered 2021-08-20: 4 mg via ORAL
  Filled 2021-08-18: qty 1

## 2021-08-18 MED ORDER — TAMSULOSIN HCL 0.4 MG PO CAPS
0.4000 mg | ORAL_CAPSULE | Freq: Every day | ORAL | Status: DC
  Administered 2021-08-19 – 2021-08-22 (×4): 0.4 mg via ORAL
  Filled 2021-08-18 (×4): qty 1

## 2021-08-18 MED ORDER — OXYCODONE HCL 5 MG PO TABS
5.0000 mg | ORAL_TABLET | ORAL | Status: DC | PRN
  Administered 2021-08-18 – 2021-08-22 (×5): 5 mg via ORAL
  Filled 2021-08-18 (×6): qty 1

## 2021-08-18 MED ORDER — ENOXAPARIN SODIUM 40 MG/0.4ML IJ SOSY
40.0000 mg | PREFILLED_SYRINGE | INTRAMUSCULAR | Status: DC
  Administered 2021-08-18 – 2021-08-21 (×4): 40 mg via SUBCUTANEOUS
  Filled 2021-08-18 (×4): qty 0.4

## 2021-08-18 MED ORDER — INSULIN GLARGINE-YFGN 100 UNIT/ML ~~LOC~~ SOLN
10.0000 [IU] | Freq: Every day | SUBCUTANEOUS | Status: DC
  Administered 2021-08-18 – 2021-08-21 (×4): 10 [IU] via SUBCUTANEOUS
  Filled 2021-08-18 (×5): qty 0.1

## 2021-08-18 MED ORDER — ONDANSETRON HCL 4 MG/2ML IJ SOLN
4.0000 mg | Freq: Once | INTRAMUSCULAR | Status: AC
  Administered 2021-08-18: 4 mg via INTRAVENOUS
  Filled 2021-08-18: qty 2

## 2021-08-18 MED ORDER — ONDANSETRON HCL 4 MG/2ML IJ SOLN: 4.0000 mg | Freq: Four times a day (QID) | INTRAMUSCULAR | Status: DC | PRN

## 2021-08-18 NOTE — Assessment & Plan Note (Signed)
BMI 31 °

## 2021-08-18 NOTE — ED Notes (Signed)
[place], MD at bedside. 

## 2021-08-18 NOTE — ED Notes (Signed)
Patient to CT and x-ray at this time.  

## 2021-08-18 NOTE — Assessment & Plan Note (Addendum)
Mild, recently started on donepezil - Awaiting med rec

## 2021-08-18 NOTE — Assessment & Plan Note (Signed)
-   Continue Flomax 

## 2021-08-18 NOTE — H&P (Signed)
History and Physical    Patient: Alex Smith CBU:[id number] DOB: 01/04/1935 DOA: 08/18/2021 DOS: the patient was seen and examined on 08/18/2021 PCP: Leonel Ramsay, MD  Patient coming from: ALF/ILF  Chief Complaint: Weakness, fall  HPI:  [name] is an 86 y.o. M with mild Alzheimer's living in ALF, hairy cell leukemia in remission, MDS on luspatercept (not on current med list), CKD IIIa baseline 1.1-1.2, HTN, IDDM with recurrent hypoglycemia who presented with fall and weakness.  Patient first fell 1 week ago.  Seen in ER, had 7 staples placed for posterior head laceration, CT head and CT lumbar spine unremarkable (old L1 comp fx) and he was discharged home.  Since then, however, he has been weaker and weaker.  Back pain also limiting movement.  Having urinary irritative sypmtoms, worse than his typical LUTS.  Today, fell again, unwitnessed, "to the side" he thinks.  In the ER, CT head and c-spine unremarkable. CT lumbar spine showed new L2 compression fracture.  Appeared dehydrated.  Urinalysis showed WBCs in clumps.  Given IV fluids, Levaquin and hospitalists called for evaluation.       Review of Systems: Review of Systems  Constitutional:  Positive for malaise/fatigue. Negative for chills and fever.  Genitourinary:  Positive for dysuria and urgency. Negative for flank pain, frequency and hematuria.  Neurological:  Positive for weakness. Negative for focal weakness and loss of consciousness.  All other systems reviewed and are negative.    Past Medical History:  Diagnosis Date   Alzheimer's dementia ([place]) 08/18/2021   Anemia    B12 deficiency    Basal cell carcinoma 03/30/2018   left ant nasal ala   Basal cell carcinoma of face 01/19/2013   right distal dorsum nose   BPH (benign prostatic hypertrophy)    followed by urology, discharged (Dr. Bernardo Heater)   CAP (community acquired pneumonia) 02/15/2015   CKD stage 3 due to type 2 diabetes mellitus ([place]) 11/14/2017    Colon polyps    Diverticulosis    Dysplastic nevus 10/27/2006   right med calf   GERD (gastroesophageal reflux disease)    Hairy cell leukemia ([place]) 2006   recurrent, seizure on rituxan, now on cladribine [name])   History of pneumonia 2000's   "once" (07/07/2012)   History of shingles    HLD (hyperlipidemia)    Hypertension    Pneumonia    Shortness of breath dyspnea    Squamous cell carcinoma in situ 07/05/2020   Left lat. pretibial. 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 EDC 09/07/2020   Squamous cell carcinoma of skin 04/03/2020   Left cheek. WD SCC   Systolic murmur 11/07/2246   Type 2 diabetes, controlled, with retinopathy ([place])    Past Surgical History:  Procedure Laterality Date   25 GAUGE PARS PLANA VITRECTOMY WITH 20 GAUGE MVR PORT FOR MACULAR HOLE  07/07/2012   Procedure: 25 GAUGE PARS PLANA VITRECTOMY WITH 20 GAUGE MVR PORT FOR MACULAR HOLE;  Surgeon: Hayden Pedro, MD;  Location: [facility];  Service: Ophthalmology;  Laterality: Left;   BONE MARROW BIOPSY  2016   CARDIOVASCULAR STRESS TEST  2013   treadmill - no evidence ischemia, EF 61%   CATARACT EXTRACTION W/ INTRAOCULAR LENS  IMPLANT, BILATERAL  ~ 2010   COLONOSCOPY  2014   [place] WNL no rpt needed, h/o polyps   EYE SURGERY Left 06/2012   laser surgery   GAS INSERTION  07/07/2012  Procedure: INSERTION OF GAS;  Surgeon: Hayden Pedro, MD;  Location: [facility];  Service: Ophthalmology;  Laterality: Left;  C3F8   PERIPHERAL VASCULAR CATHETERIZATION N/A 02/23/2015   Procedure: Glori Luis Cath Insertion;  Surgeon: Algernon Huxley, MD;  Location: [facility];  Service: Cardiovascular;  Laterality: N/A;   PORTA CATH REMOVAL N/A 02/16/2020   Procedure: PORTA CATH REMOVAL;  Surgeon: Algernon Huxley, MD;  Location: [facility];  Service: Cardiovascular;  Laterality: N/A;   SERUM PATCH  07/07/2012   Procedure: SERUM PATCH;  Surgeon: Hayden Pedro, MD;  Location: [facility];  Service: Ophthalmology;  Laterality: Left;   SKIN CANCER EXCISION     "all over my face" (07/07/2012)   Social History:  reports that he quit smoking about 52 years ago. His smoking use included cigarettes. He has a 50.00 pack-year smoking history. He has never used smokeless tobacco. He reports current alcohol use. He reports that he does not use drugs.  Allergies  Allergen Reactions   Rituximab Rash    Chest tightness Chest tightness   Blood-Group Specific Substance Other (See Comments)    Had a post transfusion reaction of red blood cells; NOW REQUIRES WASHED BLOOD CELLS Had a post transfusion reaction of red blood cells; NOW REQUIRES WASHED BLOOD CELLS   Primaxin [Imipenem] Other (See Comments)    Possible allergy Tolerates cephalosporins   Voriconazole Other (See Comments)   Sulfa Antibiotics Itching and Rash   Sulfacetamide Sodium Itching and Rash    Family History  Problem Relation Age of Onset   Dementia Mother    Heart failure Father 74   Cancer Sister        breast   Diabetes Paternal Uncle    Diabetes Paternal Aunt    CAD Brother 103       MI   Stroke Neg Hx     Prior to Admission medications   Medication Sig Start Date End Date Taking? Authorizing Provider  amLODipine (NORVASC) 5 MG tablet Take 1 tablet (5 mg total) by mouth daily. 05/12/21   Dessa Phi, DO  aspirin EC 81 MG tablet Take 81 mg by mouth daily.    [provider]  doxazosin (CARDURA) 1 MG tablet TAKE ONE TABLET BY MOUTH EVERY DAY 08/31/19   Ria Bush, MD  famotidine (PEPCID) 40 MG tablet Take 0.5 tablets (20 mg total) by mouth daily. NEEDS OFFICE VISIT 07/16/20   Ria Bush, MD  JANUVIA 25 MG tablet Take 25 mg by mouth daily. 07/29/21   [provider]  LANTUS SOLOSTAR 100 UNIT/ML Solostar Pen Inject 10 Units into the skin at bedtime. 07/27/21   [provider]  lisinopril (ZESTRIL) 20 MG tablet TAKE ONE TABLET BY MOUTH TWICE DAILY 03/01/20   Ria Bush, MD  NON FORMULARY Apply 1 application topically as needed. AMITIP5% / GABA10% / LIDO5% Apply topically to arms, abdomen and legs as needed. *Provided by patient's family* 06/28/21   [provider]  pravastatin (PRAVACHOL) 20 MG tablet TAKE ONE TABLET EVERY EVENING 03/16/20   Ria Bush, MD  tamsulosin (FLOMAX) 0.4 MG CAPS capsule TAKE 1 CAPSULE BY MOUTH EVERY DAY 12/11/17   Ria Bush, MD  vitamin B-12 (CYANOCOBALAMIN) 1000 MCG tablet Take 1 tablet (1,000 mcg total) by mouth daily. 06/01/19   Ria Bush, MD  vitamin E 400 UNIT capsule Take 200 Units by mouth daily.     [provider]    Physical Exam: Vitals:  08/18/21 1430 08/18/21 1500 08/18/21 1600 08/18/21 1713  BP: 133/62 (!) 129/53 (!) 131/58 (!) 144/65  Pulse: 83 85 88 84  Resp: _0 Temp:    98.3 F (36.8 C)  TempSrc:      SpO2: 93% 90% 91% 91%  Weight:      Height:       General appearance: Frail elderly adult male, lying in bed, no acute distress.     HEENT: Anicteric, conjunctival pink, lids and lashes normal.  No nasal forming, discharge, or epistaxis dentures in place, oropharynx tacky dry, no oral lesions hearing diminished. Skin: No suspicious rashes or lesions. Cardiac: RRR, no murmurs, no lower extremity edema Respiratory: Normal respiratory rate and rhythm, lungs clear without rales or wheezes Abdomen: Soft no tenderness palpation or guarding, no ascites or distention MSK:  Neuro: Awake, responds to questions, sluggish, strength symmetric in upper and lower extremities but generally very weak, speech fluent, face symmetric Psych: Attention somewhat distracted, judgment insight appear mildly impaired by dementia, affect blunted.   Data Reviewed: Complete blood count, basic metabolic panel, COVID panel, troponin, urinalysis, CT head, CT lumbar spine, chest x-ray, pelvis chest x-ray, and ECG were all reviewed and summarized above.     Assessment/Plan *  Closed compression fracture of L2 lumbar vertebra, initial encounter ([place])- (present on admission) Has moderate pain but limiting movement in the setting of his advanced age, generalized weakness from dehydration and UTI. - Start calcitonin - Start scheduled acetaminophen - Pain not severe enough for brace or IV opiates - PT eval  Acute lower UTI leading to weakness and dehydration- (present on admission) Patient is very weak, this appears to be from dehydration and symptomatic UTI (urinary urgency, dysuria). -Start Rocephin - Follow urine culture - IV fluids overnight  Alzheimer's dementia ([place])- (present on admission) Mild, recently started on donepezil  Myelodysplastic syndrome, low grade ([place])- (present on admission) Blood counts close to baseline - Resume home medicines when medication reconciliation complete  Hairy cell leukemia, in remission ([place])- (present on admission)    Stage 3a chronic kidney disease (CKD) ([place])- (present on admission) Creatinine close to baseline  Type 2 diabetes mellitus with stage 3 chronic kidney disease ([place])- (present on admission) A1c 4.9%. - Hold Januvia - Continue home Lantus - Sensitive sliding scale correction insulin ordered - Continue aspirin and pravastatin  Essential hypertension- (present on admission) Blood pressure low normal - Hold doxazosin, lisinopril, amlodipine  BPH with obstruction/lower urinary tract symptoms- (present on admission) - Continue Flomax  Laceration of head- (present on admission) 7 staples placed 12/23. - Remove staples January 2  Obesity (BMI 30-39.9)- (present on admission) BMI 31    Advance Care Planning:   Code Status: Full Code    Consults: None  Family Communication: Daughter at bedside  Severity of Illness: The appropriate patient status for this patient is INPATIENT. Inpatient status is judged to be reasonable and necessary in order to provide the required intensity of service to ensure  the patient's safety. The patient's presenting symptoms, physical exam findings, and initial radiographic and laboratory data in the context of their chronic comorbidities is felt to place them at high risk for further clinical deterioration. Furthermore, it is not anticipated that the patient will be medically stable for discharge from the hospital within 2 midnights of admission.   * I certify that at the point of admission it is my clinical judgment that the patient will require inpatient hospital care spanning beyond 2 midnights  from the point of admission due to high intensity of service, high risk for further deterioration and high frequency of surveillance required.*  Author: Edwin Dada 08/18/2021 5:46 PM  For on call review [email or web address].

## 2021-08-18 NOTE — Assessment & Plan Note (Addendum)
BP still soft off home meds - Hold doxazosin, lisinopril, amlodipine

## 2021-08-18 NOTE — Assessment & Plan Note (Addendum)
A1c 4.9%.  Glucose elevated here - Hold Januvia - Continue home Lantus - Continue SS corrections, increase dose - Continue aspirin and pravastatin

## 2021-08-18 NOTE — Assessment & Plan Note (Addendum)
Has moderate pain but limiting movement in the setting of his advanced age, generalized weakness from dehydration and UTI.  - Continue calcitonin, plan for 1-2 weeks - Continue scheduled acetaminophen - Pain not severe enough for brace or IV opiates - PT eval

## 2021-08-18 NOTE — Hospital Course (Addendum)
Mr. Schwenke is an 86 y.o. M with mild Alzheimer's living in ALF, hairy cell leukemia in remission, MDS on luspatercept (not on current med list), CKD IIIa baseline 1.1-1.2, HTN, IDDM with recurrent hypoglycemia who presented with fall and weakness.  In the ER, CT head and c-spine unremarkable. CT lumbar spine showed new L2 compression fracture.  Appeared dehydrated.  Urinalysis showed WBCs in clumps.  Given IV fluids, Levaquin and hospitalists called for evaluation.   12/31: Admitted on IV fluids, antibiotics 1/1: Hgb down to 6.7, no clinical bleeding, transfused 1 unit

## 2021-08-18 NOTE — Assessment & Plan Note (Addendum)
Hgb drifted slightly down overnight to 6.7 - Transfuse 1 unit now - Repeat H/H tomorrow - Med rec pending

## 2021-08-18 NOTE — ED Provider Notes (Signed)
[facility] Emergency Department Provider Note  Time seen: 10:30 AM  I have reviewed the triage vital signs and the nursing notes.   HISTORY  Chief Complaint Fall   HPI Alex Smith is a 86 y.o. male with a past medical history of anemia, CKD, gastric reflux, hypertension, hyperlipidemia, diabetes, presents to the emergency department after a fall.  Patient is coming from the [facility] facility after slipping in the bathroom and falling.  Patient believes he hit his head as he has blood to the back of the head but did not lose consciousness.  Denies any use of blood thinners.  Patient was seen several days ago in the emergency department after a fall requiring staples to the back of the scalp.  Initially upon arrival patient states he felt nauseated initial blood pressure 75/46, however it is now increased to approximately 790 systolic without intervention.  Here patient is awake alert oriented x3.  No distress.   Past Medical History:  Diagnosis Date   Anemia    B12 deficiency    Basal cell carcinoma 03/30/2018   left ant nasal ala   Basal cell carcinoma of face 01/19/2013   right distal dorsum nose   BPH (benign prostatic hypertrophy)    followed by urology, discharged (Dr. 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 pretibial. 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 EDC 09/07/2020   Squamous cell  carcinoma of skin 04/03/2020   Left cheek. WD SCC   Systolic murmur 38/33/3832   Type 2 diabetes, controlled, with retinopathy [facility])     Patient Active Problem List   Diagnosis Date Noted   Hypoglycemia 06/29/2021   Severe sepsis ([place]) 05/04/2021   CAP (community acquired pneumonia) 05/02/2021   Vitamin D deficiency 03/10/2019   Depressed mood 03/10/2019   CKD stage 3 due to type 2 diabetes mellitus ([place]) 11/14/2017   Goals of care, counseling/discussion 05/27/2017   Pancytopenia due to antineoplastic chemotherapy ([place]) 11/17/2016   Chemotherapy-induced neutropenia ([place]) 09/22/2016   Urinary incontinence 91/91/6606   Systolic murmur 00/45/9977   Myelodysplastic syndrome, low grade ([place]) 02/05/2016   Multiple myeloma not having achieved remission ([place]) 02/05/2016   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   Myelodysplasia present in bone marrow ([place]) 11/03/2015   Advanced care planning/counseling discussion 05/22/2015   Insomnia 03/03/2015   Monoclonal gammopathy 02/03/2015   Macrocytic anemia 02/03/2015   Severe obesity (BMI 35.0-39.9) with comorbidity ([place]) 10/12/2014   History of nonmelanoma skin cancer 10/10/2014   Medicare annual wellness visit, subsequent 04/12/2014   Health maintenance examination 04/12/2014   Hairy cell leukemia, in remission ([place])    B12 deficiency    BPH with obstruction/lower urinary tract symptoms    Essential hypertension    GERD (gastroesophageal reflux disease)    Dyslipidemia    Type 2 diabetes mellitus with hypoglycemia  without coma (HCC)    Diverticulosis    Macular hole 07/07/2012    Past Surgical History:  Procedure Laterality Date   25 GAUGE PARS PLANA VITRECTOMY WITH 20 GAUGE MVR PORT FOR MACULAR HOLE  07/07/2012   Procedure: 25 GAUGE PARS PLANA VITRECTOMY WITH 20 GAUGE MVR PORT FOR MACULAR HOLE;  Surgeon: Sherrie George, MD;  Location: [facility] OR;  Service: Ophthalmology;  Laterality: Left;   BONE MARROW BIOPSY  2016   CARDIOVASCULAR STRESS TEST   2013   treadmill - no evidence ischemia, EF 61%   CATARACT EXTRACTION W/ INTRAOCULAR LENS  IMPLANT, BILATERAL  ~ 2010   COLONOSCOPY  2014   [place] WNL no rpt needed, h/o polyps   EYE SURGERY Left 06/2012   laser surgery   GAS INSERTION  07/07/2012   Procedure: INSERTION OF GAS;  Surgeon: Sherrie George, MD;  Location: [facility] OR;  Service: Ophthalmology;  Laterality: Left;  C3F8   PERIPHERAL VASCULAR CATHETERIZATION N/A 02/23/2015   Procedure: Shelda Pal Cath Insertion;  Surgeon: Annice Needy, MD;  Location: [facility] INVASIVE CV LAB;  Service: Cardiovascular;  Laterality: N/A;   PORTA CATH REMOVAL N/A 02/16/2020   Procedure: PORTA CATH REMOVAL;  Surgeon: Annice Needy, MD;  Location: [facility] INVASIVE CV LAB;  Service: Cardiovascular;  Laterality: N/A;   SERUM PATCH  07/07/2012   Procedure: SERUM PATCH;  Surgeon: Sherrie George, MD;  Location: [facility] OR;  Service: Ophthalmology;  Laterality: Left;   SKIN CANCER EXCISION     "all over my face" (07/07/2012)    Prior to Admission medications   Medication Sig Start Date End Date Taking? Authorizing Provider  amLODipine (NORVASC) 5 MG tablet Take 1 tablet (5 mg total) by mouth daily. 05/12/21   Noralee Stain, DO  aspirin EC 81 MG tablet Take 81 mg by mouth daily.    [provider]  doxazosin (CARDURA) 1 MG tablet TAKE ONE TABLET BY MOUTH EVERY DAY 08/31/19   Eustaquio Boyden, MD  famotidine (PEPCID) 40 MG tablet Take 0.5 tablets (20 mg total) by mouth daily. NEEDS OFFICE VISIT 07/16/20   Eustaquio Boyden, MD  JANUVIA 25 MG tablet Take 25 mg by mouth daily. 07/29/21   [provider]  LANTUS SOLOSTAR 100 UNIT/ML Solostar Pen Inject 10 Units into the skin at bedtime. 07/27/21   [provider]  lisinopril (ZESTRIL) 20 MG tablet TAKE ONE TABLET BY MOUTH TWICE DAILY 03/01/20   Eustaquio Boyden, MD  NON FORMULARY Apply 1 application topically as needed. AMITIP5% / GABA10% / LIDO5% Apply topically to arms, abdomen and legs as needed. *Provided  by patient's family* 06/28/21   [provider]  pravastatin (PRAVACHOL) 20 MG tablet TAKE ONE TABLET EVERY EVENING 03/16/20   Eustaquio Boyden, MD  tamsulosin (FLOMAX) 0.4 MG CAPS capsule TAKE 1 CAPSULE BY MOUTH EVERY DAY 12/11/17   Eustaquio Boyden, MD  vitamin B-12 (CYANOCOBALAMIN) 1000 MCG tablet Take 1 tablet (1,000 mcg total) by mouth daily. 06/01/19   Eustaquio Boyden, MD  vitamin E 400 UNIT capsule Take 200 Units by mouth daily.     [provider]    Allergies  Allergen Reactions   Rituximab Rash    Chest tightness Chest tightness   Blood-Group Specific Substance Other (See Comments)    Had a post transfusion reaction of red blood cells; NOW REQUIRES WASHED BLOOD CELLS Had a post transfusion reaction of red blood cells; NOW REQUIRES WASHED BLOOD CELLS   Primaxin [Imipenem] Other (See Comments)  Possible allergy Possible allergy   Voriconazole Other (See Comments)   Sulfa Antibiotics Itching and Rash   Sulfacetamide Sodium Itching and Rash    Family History  Problem Relation Age of Onset   Dementia Mother    Heart failure Father 19   Cancer Sister        breast   Diabetes Paternal Uncle    Diabetes Paternal Aunt    CAD Brother 9       MI   Stroke Neg Hx     Social History Social History   Tobacco Use   Smoking status: Former    Packs/day: 2.00    Years: 25.00    Pack years: 50.00    Types: Cigarettes    Quit date: 02/06/1969    Years since quitting: 52.5   Smokeless tobacco: Never   Tobacco comments:    07/07/2012 "stopped smoking ~ 40 yr ago; smoked 20-52yr  Vaping Use   Vaping Use: Never used  Substance Use Topics   Alcohol use: Yes    Alcohol/week: 0.0 standard drinks    Comment: rare   Drug use: No    Review of Systems Constitutional: Negative for fever. Cardiovascular: Negative for chest pain. Respiratory: Negative for shortness of breath. Gastrointestinal: Negative for abdominal pain, vomiting  Musculoskeletal:  Negative for musculoskeletal complaints Skin: Noted some blood to the back of the head. Neurological: Negative for headache All other ROS negative  ____________________________________________   PHYSICAL EXAM:  VITAL SIGNS: ED Triage Vitals  Enc Vitals Group     BP 08/18/21 0932 (!) 128/56     Pulse Rate 08/18/21 0932 85     Resp 08/18/21 0932 18     Temp 08/18/21 0935 97.8 F (36.6 C)     Temp Source 08/18/21 0935 Oral     SpO2 08/18/21 0932 94 %     Weight 08/18/21 0933 200 lb (90.7 kg)     Height 08/18/21 0933 _0  (1.702 m)     Head Circumference --      [facility] Flow --      Pain Score 08/18/21 0933 4     Pain Loc --      Pain Edu? --      Excl. in [place] --    Constitutional: Alert and oriented. Well appearing and in no distress. Eyes: Normal exam ENT      Head: Patient has dried blood to the occipital scalp.  After cleaning it this appears to be over the area where he has previous staples from his fall a week ago.  No new laceration noted.  Laceration is hemostatic.      Mouth/Throat: Mucous membranes are moist. Cardiovascular: Normal rate, regular rhythm.  Respiratory: Normal respiratory effort without tachypnea nor retractions. Breath sounds are clear  Gastrointestinal: Soft and nontender. No distention.   Musculoskeletal: Nontender with normal range of motion in all extremities.  Neurologic:  Normal speech and language. No gross focal neurologic deficits Skin:  Skin is warm, dry and intact.  Psychiatric: Mood and affect are normal.  ____________________________________________    EKG  EKG viewed and interpreted by myself shows what appears to be sinus rhythm at 83 bpm with a narrow QRS, normal axis, largely normal intervals with nonspecific ST changes no ST elevation.  ____________________________________________    RADIOLOGY  CT head and C-spine are negative for acute abnormality. CT lumbar spine shows an acute L2 compression fracture with mild height  loss. Chest and pelvis x-rays are negative. ____________________________________________  INITIAL IMPRESSION / ASSESSMENT AND PLAN / ED COURSE  Pertinent labs & imaging results that were available during my care of the patient were reviewed by me and considered in my medical decision making (see chart for details).   Patient presents to the emergency department after a fall from his nursing facility.  Patient has been seen multiple times recently for low blood sugar as well as fall.  Patient last was seen 08/09/2021 for a fall.  Here overall patient appears well, he is alert and oriented, he has no complaints besides some nausea.  We will IV hydrate, treat nausea.  We will check labs, COVID swab we will obtain CT imaging of the head and C-spine as a precaution.  We will continue to closely monitor while awaiting results.  Patient and family member agreeable to plan of care.  Daughter is now here the patient states the patient for the past week or so has been feeling very weak this is now his second fall in a week.  Patient's work-up today shows a significant urinary tract infection.  We will send a urine culture start the patient on IV Rocephin and admit to the hospitalist service.  Patient and daughter agreeable to plan of care.  Alex Smith was evaluated in Emergency Department on 08/18/2021 for the symptoms described in the history of present illness. He was evaluated in the context of the global COVID-19 pandemic, which necessitated consideration that the patient might be at risk for infection with the SARS-CoV-2 virus that causes COVID-19. Institutional protocols and algorithms that pertain to the evaluation of patients at risk for COVID-19 are in a state of rapid change based on information released by regulatory bodies including the CDC and federal and state organizations. These policies and algorithms were followed during the patient's care in the  ED.  ____________________________________________   FINAL CLINICAL IMPRESSION(S) / ED DIAGNOSES  Fall Weakness Urinary tract infection   Harvest Dark, MD 08/18/21 1454

## 2021-08-18 NOTE — Assessment & Plan Note (Addendum)
Patient is very weak, this appears to be from dehydration and symptomatic UTI (urinary urgency, dysuria).  UCx pending. - Continue Rocephin - Follow urine culture

## 2021-08-18 NOTE — ED Triage Notes (Signed)
Pt arrives via EMS from the [facility] after slipping in the bathroom and falling- pt states he sis hit his head but did not lose consciousness- pt not on blood thinners- pt was seen here for a fall a few days ago and has staples on the back of his head- no active bleeding at this time

## 2021-08-18 NOTE — Assessment & Plan Note (Signed)
7 staples placed 12/23. - Remove staples January 2

## 2021-08-18 NOTE — Assessment & Plan Note (Addendum)
Creatinine stable, close to baseline

## 2021-08-19 DIAGNOSIS — G309 Alzheimer's disease, unspecified: Secondary | ICD-10-CM

## 2021-08-19 DIAGNOSIS — N390 Urinary tract infection, site not specified: Secondary | ICD-10-CM

## 2021-08-19 DIAGNOSIS — F0280 Dementia in other diseases classified elsewhere without behavioral disturbance: Secondary | ICD-10-CM

## 2021-08-19 DIAGNOSIS — N138 Other obstructive and reflux uropathy: Secondary | ICD-10-CM

## 2021-08-19 DIAGNOSIS — N401 Enlarged prostate with lower urinary tract symptoms: Secondary | ICD-10-CM

## 2021-08-19 LAB — CBC
HCT: 21.9 % — ABNORMAL LOW (ref 39.0–52.0)
Hemoglobin: 6.9 g/dL — ABNORMAL LOW (ref 13.0–17.0)
MCH: 33.8 pg (ref 26.0–34.0)
MCHC: 31.5 g/dL (ref 30.0–36.0)
MCV: 107.4 fL — ABNORMAL HIGH (ref 80.0–100.0)
Platelets: 106 10*3/uL — ABNORMAL LOW (ref 150–400)
RBC: 2.04 MIL/uL — ABNORMAL LOW (ref 4.22–5.81)
RDW: 14.9 % (ref 11.5–15.5)
WBC: 2.5 10*3/uL — ABNORMAL LOW (ref 4.0–10.5)
nRBC: 0 % (ref 0.0–0.2)

## 2021-08-19 LAB — PREPARE RBC (CROSSMATCH)

## 2021-08-19 LAB — GLUCOSE, CAPILLARY
Glucose-Capillary: 218 mg/dL — ABNORMAL HIGH (ref 70–99)
Glucose-Capillary: 263 mg/dL — ABNORMAL HIGH (ref 70–99)
Glucose-Capillary: 223 mg/dL — ABNORMAL HIGH (ref 70–99)
Glucose-Capillary: 176 mg/dL — ABNORMAL HIGH (ref 70–99)

## 2021-08-19 LAB — OCCULT BLOOD X 1 CARD TO LAB, STOOL: Fecal Occult Bld: NEGATIVE

## 2021-08-19 LAB — BASIC METABOLIC PANEL
Anion gap: 3 — ABNORMAL LOW (ref 5–15)
BUN: 20 mg/dL (ref 8–23)
CO2: 25 mmol/L (ref 22–32)
Calcium: 8.1 mg/dL — ABNORMAL LOW (ref 8.9–10.3)
Chloride: 106 mmol/L (ref 98–111)
Creatinine, Ser: 1.29 mg/dL — ABNORMAL HIGH (ref 0.61–1.24)
GFR, Estimated: 55 mL/min — ABNORMAL LOW (ref >60)
Glucose, Bld: 246 mg/dL — ABNORMAL HIGH (ref 70–99)
Potassium: 3.9 mmol/L (ref 3.5–5.1)
Sodium: 134 mmol/L — ABNORMAL LOW (ref 135–145)

## 2021-08-19 MED ORDER — INSULIN ASPART 100 UNIT/ML IJ SOLN: 0.0000 [IU] | Freq: Every day | INTRAMUSCULAR | Status: DC

## 2021-08-19 MED ORDER — SODIUM CHLORIDE 0.9% IV SOLUTION: Freq: Once | INTRAVENOUS | Status: AC

## 2021-08-19 MED ORDER — INSULIN ASPART 100 UNIT/ML IJ SOLN
0.0000 [IU] | Freq: Three times a day (TID) | INTRAMUSCULAR | Status: DC
  Administered 2021-08-19 – 2021-08-20 (×2): 5 [IU] via SUBCUTANEOUS
  Administered 2021-08-20: 3 [IU] via SUBCUTANEOUS
  Administered 2021-08-20: 5 [IU] via SUBCUTANEOUS
  Administered 2021-08-21: 2 [IU] via SUBCUTANEOUS
  Administered 2021-08-21: 3 [IU] via SUBCUTANEOUS
  Administered 2021-08-21: 8 [IU] via SUBCUTANEOUS
  Administered 2021-08-22: 5 [IU] via SUBCUTANEOUS
  Administered 2021-08-22: 8 [IU] via SUBCUTANEOUS
  Filled 2021-08-19 (×9): qty 1

## 2021-08-19 NOTE — [place] Notes (Signed)
PT Cancellation Note  Patient Details Name: Alex Smith MRN: [id number] DOB: 15-Mar-1935   Cancelled Treatment:    Reason Eval/Treat Not Completed: Patient not medically ready.  PT consult received.  Chart reviewed.  Pt's Hgb noted to be down-trending to 6.9 this morning.  Per PT guidelines for low Hgb, will hold physical therapy at this time (order noted in chart for RBC transfusion) and will re-attempt PT evaluation at a later date/time as medically appropriate.    Leitha Bleak, PT 08/19/21, 10:03 AM

## 2021-08-19 NOTE — TOC CM/SW Note (Addendum)
Left VM for son Brayen requesting return call for high risk assessment.  Per chart review patient is not fully oriented. Per chart review patient is from [facility]. Went to [facility] in the past for STR. TOC to continue to follow.  Oleh Genin, [place]

## 2021-08-19 NOTE — TOC Initial Note (Signed)
Transition of Care [facility]) - Initial/Assessment Note    Patient Details  Name: Alex Smith MRN: [id number] Date of Birth: 16-Nov-1934  Transition of Care [facility]) CM/SW Contact:    Magnus Ivan, LCSW Phone Number: 08/19/2021, 10:22 AM  Clinical Narrative:                 Spoke to son Flor via phone.  He confirmed patient lives at [facility] and uses a RW at baseline. Patient went to [facility] in the past for about 5-6 weeks. PT eval pending, they were unable to see due to patient's Hgb. Joao reported patient's son Marden Noble is at bedside today and we can discuss DC planning with him as well as needed. TOC will follow for PT recs and follow up with sons after.  Expected Discharge Plan: Assisted Living Barriers to Discharge: Continued Medical Work up   Patient Goals and CMS Choice Patient states their goals for this hospitalization and ongoing recovery are:: based on PT evals CMS Medicare.gov Compare Post Acute Care list provided to:: Patient Represenative (must comment) Choice offered to / list presented to : Adult Children  Expected Discharge Plan and Services Expected Discharge Plan: Assisted Living       Living arrangements for the past 2 months: [place]                                      Prior Living Arrangements/Services Living arrangements for the past 2 months: [place] Lives with:: Facility Resident Patient language and need for interpreter reviewed:: Yes Do you feel safe going back to the place where you live?: Yes      Need for Family Participation in Patient Care: Yes (Comment) Care giver support system in place?: Yes (comment) Current home services: DME Criminal Activity/Legal Involvement Pertinent to Current Situation/Hospitalization: No - Comment as needed  Activities of Daily Living Home Assistive Devices/Equipment: Gilford Rile (specify type) ADL Screening (condition at time of admission) Patient's cognitive ability  adequate to safely complete daily activities?: Yes Is the patient deaf or have difficulty hearing?: No Does the patient have difficulty seeing, even when wearing glasses/contacts?: No Does the patient have difficulty concentrating, remembering, or making decisions?: No Patient able to express need for assistance with ADLs?: Yes Does the patient have difficulty dressing or bathing?: Yes Independently performs ADLs?: Yes (appropriate for developmental age) Does the patient have difficulty walking or climbing stairs?: Yes Weakness of Legs: Right Weakness of Arms/Hands: None  Permission Sought/Granted Permission sought to share information with : Facility Sport and exercise psychologist, Family Supports Permission granted to share information with : Yes, Hospital doctor (by son Arless)  Share Information with NAME: other son Marden Noble  Permission granted to share info w AGENCY: ALF, SNF, HH, and DME agencies        Emotional Assessment       Orientation: : Fluctuating Orientation (Suspected and/or reported [place]) Alcohol / Substance Use: Not Applicable Psych Involvement: No (comment)  Admission diagnosis:  Lower urinary tract infectious disease [N39.0] Weakness [R53.1] Fall, initial encounter [W19.XXXA] Closed compression fracture of L2 lumbar vertebra, initial encounter [facility]) [S32.020A] Patient Active Problem List   Diagnosis Date Noted   Closed compression fracture of L2 lumbar vertebra, initial encounter ([place]) 08/18/2021   Alzheimer's dementia ([place]) 08/18/2021   Acute lower UTI leading to weakness and dehydration 08/18/2021   Laceration of head 08/18/2021   Hypoglycemia  06/29/2021   Severe sepsis ([place]) 05/04/2021   CAP (community acquired pneumonia) 05/02/2021   Vitamin D deficiency 03/10/2019   Depressed mood 03/10/2019   Stage 3a chronic kidney disease (CKD) ([place]) 11/14/2017   Goals of care, counseling/discussion 05/27/2017   Pancytopenia due to antineoplastic  chemotherapy ([place]) 11/17/2016   Chemotherapy-induced neutropenia ([place]) 09/22/2016   Urinary incontinence 04/21/148   Systolic murmur 96/92/4932   Myelodysplastic syndrome, low grade ([place]) 02/05/2016   Multiple myeloma not having achieved remission ([place]) 02/05/2016   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   Myelodysplasia present in bone marrow ([place]) 11/03/2015   Advanced care planning/counseling discussion 05/22/2015   Insomnia 03/03/2015   Monoclonal gammopathy 02/03/2015   Macrocytic anemia 02/03/2015   Obesity (BMI 30-39.9) 10/12/2014   History of nonmelanoma skin cancer 10/10/2014   Medicare annual wellness visit, subsequent 04/12/2014   Health maintenance examination 04/12/2014   Hairy cell leukemia, in remission ([place])    B12 deficiency    BPH with obstruction/lower urinary tract symptoms    Essential hypertension    GERD (gastroesophageal reflux disease)    Dyslipidemia    Type 2 diabetes mellitus with stage 3 chronic kidney disease ([place])    Diverticulosis    Macular hole 07/07/2012   PCP:  Leonel Ramsay, MD Pharmacy:   [place] [place] [facility] [address] [place] [address] Phone: [phone] Fax: [phone]     Social Determinants of Health (SDOH) Interventions    Readmission Risk Interventions Readmission Risk Prevention Plan 08/19/2021 05/11/2021  Transportation Screening Complete Complete  Medication Review (RN Care Manager) Complete Complete  PCP or Specialist appointment within 3-5 days of discharge Complete Complete  HRI or Home Care Consult Complete Complete  SW Recovery Care/Counseling Consult Complete Complete  Palliative Care Screening Not Applicable Complete  Skilled Nursing Facility Complete Complete  Some recent data might be hidden

## 2021-08-19 NOTE — Progress Notes (Signed)
Progress Note   Patient: Alex Smith YOV:[id number] DOB: 26-May-1935 DOA: 08/18/2021     1 DOS: the patient was seen and examined on 08/19/2021        Brief hospital course: [name] is an 87 y.o. M with mild Alzheimer's living in ALF, hairy cell leukemia in remission, MDS on luspatercept (not on current med list), CKD IIIa baseline 1.1-1.2, HTN, IDDM with recurrent hypoglycemia who presented with fall and weakness.  In the ER, CT head and c-spine unremarkable. CT lumbar spine showed new L2 compression fracture.  Appeared dehydrated.  Urinalysis showed WBCs in clumps.  Given IV fluids, Levaquin and hospitalists called for evaluation.   12/31: Admitted on IV fluids, antibiotics 1/1: Hgb down to 6.7, no clinical bleeding, transfused 1 unit        Assessment and Plan * Closed compression fracture of L2 lumbar vertebra, initial encounter (Alex Smith)- (present on admission) Has moderate pain but limiting movement in the setting of his advanced age, generalized weakness from dehydration and UTI.  - Continue calcitonin, plan for 1-2 weeks - Continue scheduled acetaminophen - Pain not severe enough for brace or IV opiates - PT eval  Acute lower UTI leading to weakness and dehydration- (present on admission) Patient is very weak, this appears to be from dehydration and symptomatic UTI (urinary urgency, dysuria).  UCx pending. - Continue Rocephin - Follow urine culture   Alzheimer's dementia (Alex Smith)- (present on admission) Mild, recently started on donepezil - Awaiting med rec  Myelodysplastic syndrome, low grade (Alex Smith)- (present on admission) Hgb drifted slightly down overnight to 6.7 - Transfuse 1 unit now - Repeat H/H tomorrow - Med rec pending  Hairy cell leukemia, in remission (Alex Smith)- (present on admission)     Stage 3a chronic kidney disease (CKD) (Alex Smith)- (present on admission) Creatinine stable, close to baseline  Type 2 diabetes mellitus with stage 3 chronic kidney  disease (Alex Smith)- (present on admission) A1c 4.9%.  Glucose elevated here - Hold Januvia - Continue home Lantus - Continue SS corrections, increase dose - Continue aspirin and pravastatin  Essential hypertension- (present on admission) BP still soft off home meds - Hold doxazosin, lisinopril, amlodipine  BPH with obstruction/lower urinary tract symptoms- (present on admission) - Continue Flomax  Laceration of head- (present on admission) 7 staples placed 12/23. - Remove staples January 2  Obesity (BMI 30-39.9)- (present on admission) BMI 31     Subjective: Patient has had no fever, no melena, no hematochezia, no abdominal pain, no confusion.  He is still somewhat weak.  His back pain is moderate.  Objective Vital signs were reviewed and unremarkable. General appearance: Elderly adult male, lying in bed, sits up with some assistance.     HEENT:    Skin:  Cardiac: RRR, no murmurs, no lower extremity edema Respiratory: Normal respiratory rate and rhythm, lungs clear without rales or wheezes Abdomen: Abdomen soft no tenderness palpation or guarding. MSK:  Neuro:    Psych:     Data Reviewed: My review of results significant for anemia, worse than baseline.  Other cell lines stable.  Electrolytes normal, renal function stable.  Family Communication: Daughter by phone  Disposition: Status is: Inpatient  Remains inpatient appropriate because: The patient will need a blood transfusion and continued IV antibiotics.  After transfusion we will plan for PT evaluation, possibly SNF.  If Hgb improves with transfusion, and PT are able to work with patient, should be able to go home with [facility] or to SNF tomorrow  Author: Edwin Smith 08/19/2021 2:30 PM  For on call review [email or web address]

## 2021-08-20 LAB — TYPE AND SCREEN
ABO/RH(D): A NEG
Antibody Screen: NEGATIVE
Unit division: 0

## 2021-08-20 LAB — BASIC METABOLIC PANEL
Anion gap: 5 (ref 5–15)
BUN: 17 mg/dL (ref 8–23)
CO2: 27 mmol/L (ref 22–32)
Calcium: 8.1 mg/dL — ABNORMAL LOW (ref 8.9–10.3)
Chloride: 108 mmol/L (ref 98–111)
Creatinine, Ser: 1.09 mg/dL (ref 0.61–1.24)
GFR, Estimated: >60 mL/min (ref >60)
Glucose, Bld: 178 mg/dL — ABNORMAL HIGH (ref 70–99)
Potassium: 3.7 mmol/L (ref 3.5–5.1)
Sodium: 140 mmol/L (ref 135–145)

## 2021-08-20 LAB — GLUCOSE, CAPILLARY
Glucose-Capillary: 215 mg/dL — ABNORMAL HIGH (ref 70–99)
Glucose-Capillary: 229 mg/dL — ABNORMAL HIGH (ref 70–99)
Glucose-Capillary: 170 mg/dL — ABNORMAL HIGH (ref 70–99)
Glucose-Capillary: 169 mg/dL — ABNORMAL HIGH (ref 70–99)

## 2021-08-20 LAB — CBC
HCT: 24.8 % — ABNORMAL LOW (ref 39.0–52.0)
Hemoglobin: 7.9 g/dL — ABNORMAL LOW (ref 13.0–17.0)
MCH: 32.8 pg (ref 26.0–34.0)
MCHC: 31.9 g/dL (ref 30.0–36.0)
MCV: 102.9 fL — ABNORMAL HIGH (ref 80.0–100.0)
Platelets: 107 10*3/uL — ABNORMAL LOW (ref 150–400)
RBC: 2.41 MIL/uL — ABNORMAL LOW (ref 4.22–5.81)
RDW: 16.9 % — ABNORMAL HIGH (ref 11.5–15.5)
WBC: 2.6 10*3/uL — ABNORMAL LOW (ref 4.0–10.5)
nRBC: 0 % (ref 0.0–0.2)

## 2021-08-20 LAB — BPAM RBC
Blood Product Expiration Date: 202301020856
ISSUE DATE / TIME: 202301011438
Unit Type and Rh: 600

## 2021-08-20 LAB — URINE CULTURE

## 2021-08-20 MED ORDER — ENSURE ENLIVE PO LIQD
237.0000 mL | Freq: Two times a day (BID) | ORAL | Status: DC
  Administered 2021-08-21 – 2021-08-22 (×4): 237 mL via ORAL

## 2021-08-20 MED ORDER — ADULT MULTIVITAMIN W/MINERALS CH
1.0000 | ORAL_TABLET | Freq: Every day | ORAL | Status: DC
  Administered 2021-08-20 – 2021-08-22 (×3): 1 via ORAL
  Filled 2021-08-20 (×3): qty 1

## 2021-08-20 NOTE — NC FL2 (Signed)
[place] LEVEL OF CARE SCREENING TOOL     IDENTIFICATION  Patient Name: Alex Smith Birthdate: 1934/10/03 Sex: male Admission Date (Current Location): 08/18/2021  [facility] and [place] Number:  Engineering geologist and Address:  [facility], [address] [place], [place] [address]      Provider Number: [id number]  Attending Physician Name and Address:  Little Ishikawa, MD  Relative Name and Phone Number:  [place], son [phone]    Current Level of Care: Hospital Recommended Level of Care: [place] Prior Approval Number:    Date Approved/Denied:   PASRR Number: [id number]  Discharge Plan: SNF    Current Diagnoses: Patient Active Problem List   Diagnosis Date Noted   Closed compression fracture of L2 lumbar vertebra, initial encounter ([place]) 08/18/2021   Alzheimer's dementia ([place]) 08/18/2021   Acute lower UTI leading to weakness and dehydration 08/18/2021   Laceration of head 08/18/2021   Hypoglycemia 06/29/2021   Severe sepsis ([place]) 05/04/2021   CAP (community acquired pneumonia) 05/02/2021   Vitamin D deficiency 03/10/2019   Depressed mood 03/10/2019   Stage 3a chronic kidney disease (CKD) ([place]) 11/14/2017   Goals of care, counseling/discussion 05/27/2017   Pancytopenia due to antineoplastic chemotherapy ([place]) 11/17/2016   Chemotherapy-induced neutropenia ([place]) 09/22/2016   Urinary incontinence 18/29/9371   Systolic murmur 69/67/8938   Myelodysplastic syndrome, low grade ([place]) 02/05/2016   Multiple myeloma not having achieved remission ([place]) 02/05/2016   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   Myelodysplasia present in bone marrow ([place]) 11/03/2015   Advanced care planning/counseling discussion 05/22/2015   Insomnia 03/03/2015   Monoclonal gammopathy 02/03/2015   Macrocytic anemia 02/03/2015   Obesity (BMI 30-39.9) 10/12/2014   History of nonmelanoma skin cancer 10/10/2014   Medicare annual wellness  visit, subsequent 04/12/2014   Health maintenance examination 04/12/2014   Hairy cell leukemia, in remission ([place])    B12 deficiency    BPH with obstruction/lower urinary tract symptoms    Essential hypertension    GERD (gastroesophageal reflux disease)    Dyslipidemia    Type 2 diabetes mellitus with stage 3 chronic kidney disease ([place])    Diverticulosis    Macular hole 07/07/2012    Orientation RESPIRATION BLADDER Height & Weight     Self, Time, Situation, Place  Normal Continent Weight: 90.7 kg Height:  5' 7" (170.2 cm)  BEHAVIORAL SYMPTOMS/MOOD NEUROLOGICAL BOWEL NUTRITION STATUS      Continent Diet (regular)  AMBULATORY STATUS COMMUNICATION OF NEEDS Skin   Extensive Assist Verbally Normal                       Personal Care Assistance Level of Assistance  Bathing, Feeding, Dressing Bathing Assistance: Limited assistance Feeding assistance: Independent Dressing Assistance: Limited assistance     Functional Limitations Info             SPECIAL CARE FACTORS FREQUENCY  PT (By licensed PT), OT (By licensed OT)     PT Frequency: 5 times per week OT Frequency: 5 times per week            Contractures Contractures Info: Not present    Additional Factors Info  Code Status, Allergies Code Status Info: full code Allergies Info: Rituximab, Blood-group Specific Substance, Primaxin (Imipenem), Voriconazole, Sulfa Antibiotics, Sulfacetamide Sodium           Current Medications (08/20/2021):  This is the current hospital active medication list Current Facility-Administered Medications  Medication Dose Route  Frequency Provider Last Rate Last Admin   acetaminophen (TYLENOL) tablet 1,000 mg  1,000 mg Oral TID Edwin Dada, MD   1,000 mg at 08/20/21 0850   aspirin EC tablet 81 mg  81 mg Oral Daily Edwin Dada, MD   81 mg at 08/20/21 0850   calcitonin (salmon) (MIACALCIN/FORTICAL) nasal spray 1 spray  1 spray Alternating Nares Daily Danford,  Suann Larry, MD   1 spray at 08/20/21 0901   cefTRIAXone (ROCEPHIN) 1 g in sodium chloride 0.9 % 100 mL IVPB  1 g Intravenous Q24H Edwin Dada, MD 200 mL/hr at 08/20/21 0857 1 g at 08/20/21 0857   enoxaparin (LOVENOX) injection 40 mg  40 mg Subcutaneous Q24H Edwin Dada, MD   40 mg at 08/19/21 2140   famotidine (PEPCID) tablet 20 mg  20 mg Oral Daily Edwin Dada, MD   20 mg at 08/20/21 0850   insulin aspart (novoLOG) injection 0-15 Units  0-15 Units Subcutaneous TID WC Edwin Dada, MD   5 Units at 08/20/21 0901   insulin aspart (novoLOG) injection 0-5 Units  0-5 Units Subcutaneous QHS Danford, Suann Larry, MD       insulin glargine-yfgn (SEMGLEE) injection 10 Units  10 Units Subcutaneous QHS Edwin Dada, MD   10 Units at 08/19/21 2139   ondansetron (ZOFRAN) tablet 4 mg  4 mg Oral Q6H PRN Edwin Dada, MD   4 mg at 08/20/21 0850   Or   ondansetron (ZOFRAN) injection 4 mg  4 mg Intravenous Q6H PRN Danford, Suann Larry, MD       oxyCODONE (Oxy IR/ROXICODONE) immediate release tablet 5 mg  5 mg Oral Q4H PRN Edwin Dada, MD   5 mg at 08/20/21 0850   pravastatin (PRAVACHOL) tablet 20 mg  20 mg Oral QPM Danford, Suann Larry, MD   20 mg at 08/19/21 1657   tamsulosin (FLOMAX) capsule 0.4 mg  0.4 mg Oral Daily Edwin Dada, MD   0.4 mg at 08/20/21 6060   Facility-Administered Medications Ordered in Other Encounters  Medication Dose Route Frequency Provider Last Rate Last Admin   heparin lock flush 100 unit/mL  500 Units Intravenous Once Corcoran, Melissa C, MD       luspatercept-aamt (REBLOZYL) subcutaneous injection 100 mg  100 mg Subcutaneous Q21 days Nolon Stalls C, MD   100 mg at 07/28/20 1447   sodium chloride 0.9 % injection 10 mL  10 mL Intravenous PRN Nolon Stalls C, MD   10 mL at 03/03/15 0903   sodium chloride 0.9 % injection 10 mL  10 mL Intracatheter PRN Nolon Stalls C, MD   10 mL at  03/10/15 1410   sodium chloride flush (NS) 0.9 % injection 10 mL  10 mL Intravenous PRN Lequita Asal, MD   10 mL at 07/23/18 1517     Discharge Medications: Please see discharge summary for a list of discharge medications.  Relevant Imaging Results:  Relevant Lab Results:   Additional Information SS #: [id number]  Conception Oms, RN

## 2021-08-20 NOTE — Progress Notes (Signed)
Initial Nutrition Assessment  DOCUMENTATION CODES:  Obesity unspecified  INTERVENTION:  Add Ensure Enlive po BID, each supplement provides 350 kcal and 20 grams of protein.  Add MVI with minerals daily.  Recommend adding feeding assistance with meals.  Encourage PO and supplement intake.  NUTRITION DIAGNOSIS:  Increased nutrient needs related to chronic illness (CKD) as evidenced by estimated needs.  GOAL:  Patient will meet greater than or equal to 90% of their needs  MONITOR:  PO intake, Supplement acceptance, Labs, Weight trends, I & O's  REASON FOR ASSESSMENT:  Malnutrition Screening Tool    ASSESSMENT:  87 yo male with a PMH of anemia, CKD, HTN, T2DM, multiple myeloma, hairy cell leukemia in remission, and Alzheimer's dementia who presents with closed compression facture of L2 lumbar vertebra.  Pt with physical therapy at time of visit. RD to follow up at a later time.  Pt with no documented meal intakes.  Pt gradually losing weight per Epic. Pt has lost ~10 lbs (4.6%) in the past 2.5 months, which is not necessarily significant for the time frame.  Recommend feeding assistance given dementia.  Medications: reviewed; Pepcid, SSI, Semglee, Zofran PO PRN (given once today), oxycodone PO PRN (given once today)  Labs: reviewed; CBG 176-263 (H) HbA1c: 4.9% (06/29/2021)  NUTRITION - FOCUSED PHYSICAL EXAM: Unable to perform - defer to follow-up  Diet Order:   Diet Order             Diet heart healthy/carb modified Room service appropriate? Yes; Fluid consistency: Thin  Diet effective now                  EDUCATION NEEDS:  No education needs have been identified at this time  Skin:  Skin Assessment: Reviewed RN Assessment  Last BM:  08/20/21 - Type 5, medium  Height:  Ht Readings from Last 1 Encounters:  08/18/21 5' 7"  (1.702 m)   Weight:  Wt Readings from Last 1 Encounters:  08/18/21 90.7 kg   BMI:  Body mass index is 31.32 kg/m.  Estimated  Nutritional Needs:  Kcal:  1850-2050 Protein:  105-120 grams Fluid:  >1.85 L  Derrel Nip, RD, LDN (she/her/hers) Clinical Inpatient Dietitian RD Pager/After-Hours/Weekend Pager # in [place]

## 2021-08-20 NOTE — Plan of Care (Signed)

## 2021-08-20 NOTE — Progress Notes (Signed)
PROGRESS NOTE    Alex Smith  NLZ:[id number] DOB: July 02, 1935 DOA: 08/18/2021 PCP: Leonel Ramsay, MD   Brief Narrative:  Alex Smith is an 87 y.o. M with mild Alzheimer's living in ALF, hairy cell leukemia in remission, MDS on luspatercept (not on current med list), CKD IIIa baseline 1.1-1.2, HTN, IDDM with recurrent hypoglycemia who presented with fall and weakness.   In the ER, CT head and c-spine unremarkable. CT lumbar spine showed new L2 compression fracture.  Appeared dehydrated.  Urinalysis showed WBCs in clumps.  Given IV fluids, Levaquin and hospitalists called for evaluation.     12/31: Admitted on IV fluids, antibiotics 1/1: Hgb down to 6.7, no clinical bleeding, transfused 1 unit 1/2 -hemoglobin within normal limits, staples to be removed  Assessment & Plan:   Closed compression fracture of L2 lumbar vertebra, initial encounter ([place])- (present on admission) - Has moderate pain but limiting movement in the setting of his advanced age, generalized weakness from dehydration and UTI. -Continue calcitonin, plan for 1-2 weeks - Continue scheduled acetaminophen; Pain not severe enough for brace or IV opiates - PT eval ongoing recommending SNF at this point   Acute lower UTI leading to weakness and dehydration- (present on admission) -Ceftriaxone ongoing -last dose 08/21/2021 -Culture showed multiple species   Alzheimer's dementia ([place])- (present on admission) -Continue donepezil   Myelodysplastic syndrome, low grade ([place])- (present on admission) -Hemoglobin 6.7 yesterday and, 7.9 today status post 1 unit transfusion -Likely production issue given no signs or symptoms of bleeding at this time   Hairy cell leukemia, in remission ([place])- (present on admission)  Stage 3a chronic kidney disease (CKD) ([place])- (present on admission) Creatinine stable, close to baseline   Type 2 diabetes mellitus (well controlled) with stage 3 chronic kidney disease ([place])- (present on  admission) A1c 4.9%.  Glucose elevated here - Hold Januvia - Continue home Lantus - Continue SS corrections, increase dose - Continue aspirin and pravastatin   Essential hypertension- (present on admission) BP still soft off home meds - Hold doxazosin, lisinopril, amlodipine   BPH with obstruction/lower urinary tract symptoms- (present on admission) - Continue Flomax   Laceration of head- (present on admission) 7 staples placed 12/23. - Remove staples January 2   Obesity (BMI 30-39.9)- (present on admission) BMI 31  DVT prophylaxis: Lovenox Code Status: Full Family Communication: Daughter at bedside  Status is: Inpatient  Dispo: The patient is from: Assisted living              Anticipated d/c is to: SNF              Anticipated d/c date is: 24 to 48 hours              Patient currently is medically stable for discharge  Consultants:  None  Procedures:  None  Antimicrobials:  Ceftriaxone x3 days  Subjective: No acute issues or events overnight  Objective: Vitals:   08/19/21 1452 08/19/21 1648 08/19/21 1929 08/20/21 0400  BP: (!) 135/52 (!) 144/61 (!) 140/58 (!) 166/54  Pulse: 79 78 73 67  Resp: 18 18 16 16   Temp: 98.1 F (36.7 C) 97.7 F (36.5 C) 98.1 F (36.7 C) 98.2 F (36.8 C)  TempSrc: Oral     SpO2: 92% 97% 94% 100%  Weight:      Height:        Intake/Output Summary (Last 24 hours) at 08/20/2021 0717 Last data filed at 08/20/2021 0400 Gross per 24 hour  Intake 374.67  ml  Output 450 ml  Net -75.33 ml   Filed Weights   08/18/21 0933  Weight: 90.7 kg    Examination:  General:  Pleasantly resting in bed, No acute distress. HEENT:  Normocephalic atraumatic.  Sclerae nonicteric, noninjected.  Extraocular movements intact bilaterally.  Left occipital staples, #7, intact Neck:  Without mass or deformity.  Trachea is midline. Lungs:  Clear to auscultate bilaterally without rhonchi, wheeze, or rales. Heart:  Regular rate and rhythm.  Without  murmurs, rubs, or gallops. Abdomen:  Soft, nontender, nondistended.  Without guarding or rebound. Extremities: Without cyanosis, clubbing, edema, or obvious deformity. Vascular:  Dorsalis pedis and posterior tibial pulses palpable bilaterally. Skin:  Warm and dry, no erythema, no ulcerations.   Data Reviewed: I have personally reviewed following labs and imaging studies  CBC: Recent Labs  Lab 08/18/21 1027 08/19/21 0426 08/20/21 0548  WBC 4.0 2.5* 2.6*  NEUTROABS 2.6  --   --   HGB 8.1* 6.9* 7.9*  HCT 25.1* 21.9* 24.8*  MCV 105.5* 107.4* 102.9*  PLT 121* 106* 397*   Basic Metabolic Panel: Recent Labs  Lab 08/18/21 1113 08/19/21 0426 08/20/21 0548  NA 134* 134* 140  K 3.6 3.9 3.7  CL 105 106 108  CO2 23 25 27   GLUCOSE 235* 246* 178*  BUN 21 20 17   CREATININE 1.31* 1.29* 1.09  CALCIUM 8.0* 8.1* 8.1*   GFR: Estimated Creatinine Clearance: 54.2 mL/min (by C-G formula based on SCr of 1.09 mg/dL). Liver Function Tests: Recent Labs  Lab 08/18/21 1113  AST 16  ALT 17  ALKPHOS 89  BILITOT 1.1  PROT 6.4*  ALBUMIN 2.9*   No results for input(s): LIPASE, AMYLASE in the last 168 hours. No results for input(s): AMMONIA in the last 168 hours. Coagulation Profile: Recent Labs  Lab 08/18/21 1027  INR 1.1   Cardiac Enzymes: No results for input(s): CKTOTAL, CKMB, CKMBINDEX, TROPONINI in the last 168 hours. BNP (last 3 results) No results for input(s): PROBNP in the last 8760 hours. HbA1C: No results for input(s): HGBA1C in the last 72 hours. CBG: Recent Labs  Lab 08/18/21 2103 08/19/21 0726 08/19/21 1128 08/19/21 1629 08/19/21 2108  GLUCAP 286* 218* 263* 223* 176*   Lipid Profile: No results for input(s): CHOL, HDL, LDLCALC, TRIG, CHOLHDL, LDLDIRECT in the last 72 hours. Thyroid Function Tests: No results for input(s): TSH, T4TOTAL, FREET4, T3FREE, THYROIDAB in the last 72 hours. Anemia Panel: No results for input(s): VITAMINB12, FOLATE, FERRITIN, TIBC,  IRON, RETICCTPCT in the last 72 hours. Sepsis Labs: No results for input(s): PROCALCITON, LATICACIDVEN in the last 168 hours.  Recent Results (from the past 240 hour(s))  Resp Panel by RT-PCR (Flu A&B, Covid) Nasopharyngeal Swab     Status: None   Collection Time: 08/18/21 10:27 AM   Specimen: Nasopharyngeal Swab; Nasopharyngeal(NP) swabs in vial transport medium  Result Value Ref Range Status   SARS Coronavirus 2 by RT PCR NEGATIVE NEGATIVE Final    Comment: (NOTE) SARS-CoV-2 target nucleic acids are NOT DETECTED.  The SARS-CoV-2 RNA is generally detectable in upper respiratory specimens during the acute phase of infection. The lowest concentration of SARS-CoV-2 viral copies this assay can detect is 138 copies/mL. A negative result does not preclude SARS-Cov-2 infection and should not be used as the sole basis for treatment or other patient management decisions. A negative result may occur with  improper specimen collection/handling, submission of specimen other than nasopharyngeal swab, presence of viral mutation(s) within the areas targeted by  this assay, and inadequate number of viral copies(<138 copies/mL). A negative result must be combined with clinical observations, patient history, and epidemiological information. The expected result is Negative.  Fact Sheet for Patients:  EntrepreneurPulse.com.au  Fact Sheet for Healthcare Providers:  IncredibleEmployment.be  This test is no t yet approved or cleared by the [place] FDA and  has been authorized for detection and/or diagnosis of SARS-CoV-2 by FDA under an Emergency Use Authorization (EUA). This EUA will remain  in effect (meaning this test can be used) for the duration of the COVID-19 declaration under Section 564(b)(1) of the Act, 21 U.S.C.section 360bbb-3(b)(1), unless the authorization is terminated  or revoked sooner.       Influenza A by PCR NEGATIVE NEGATIVE Final    Influenza B by PCR NEGATIVE NEGATIVE Final    Comment: (NOTE) The Xpert Xpress SARS-CoV-2/FLU/RSV plus assay is intended as an aid in the diagnosis of influenza from Nasopharyngeal swab specimens and should not be used as a sole basis for treatment. Nasal washings and aspirates are unacceptable for Xpert Xpress SARS-CoV-2/FLU/RSV testing.  Fact Sheet for Patients: EntrepreneurPulse.com.au  Fact Sheet for Healthcare Providers: IncredibleEmployment.be  This test is not yet approved or cleared by the [place] FDA and has been authorized for detection and/or diagnosis of SARS-CoV-2 by FDA under an Emergency Use Authorization (EUA). This EUA will remain in effect (meaning this test can be used) for the duration of the COVID-19 declaration under Section 564(b)(1) of the Act, 21 U.S.C. section 360bbb-3(b)(1), unless the authorization is terminated or revoked.  Performed at [facility], [address] [place], [place] [address]          Radiology Studies: CT HEAD WO CONTRAST (5MM)  Result Date: 08/18/2021 CLINICAL DATA:  87 year old male with history of head trauma from a fall. EXAM: CT HEAD WITHOUT CONTRAST CT CERVICAL SPINE WITHOUT CONTRAST TECHNIQUE: Multidetector CT imaging of the head and cervical spine was performed following the standard protocol without intravenous contrast. Multiplanar CT image reconstructions of the cervical spine were also generated. COMPARISON:  No priors. FINDINGS: CT HEAD FINDINGS Brain: Moderate cerebral and cerebellar atrophy. Patchy and confluent areas of decreased attenuation are noted throughout the deep and periventricular white matter of the cerebral hemispheres bilaterally, compatible with chronic microvascular ischemic disease. No evidence of acute infarction, hemorrhage, hydrocephalus, extra-axial collection or mass lesion/mass effect. Vascular: No hyperdense vessel or unexpected calcification.  Skull: Normal. Negative for fracture or focal lesion. Sinuses/Orbits: No acute finding.  Small R mastoid effusion. Other: None. CT CERVICAL SPINE FINDINGS Alignment: Normal. Skull base and vertebrae: No acute fracture. No primary bone lesion or focal pathologic process. Soft tissues and spinal canal: No prevertebral fluid or swelling. No visible canal hematoma. Disc levels: Multilevel degenerative disc disease, most pronounced at C4-C5. Mild multilevel facet arthropathy. Upper chest: Negative. Other: None. IMPRESSION: 1. No evidence of significant acute traumatic injury to the skull, brain or cervical spine. 2. Moderate cerebral and cerebellar atrophy with chronic microvascular ischemic changes in the cerebral white matter, as above. 3. Small right mastoid effusion. 4. Multilevel degenerative disc disease and cervical spondylosis, as above. Electronically Signed   By: Vinnie Langton M.D.   On: 08/18/2021 11:49   CT Cervical Spine Wo Contrast  Result Date: 08/18/2021 CLINICAL DATA:  87 year old male with history of head trauma from a fall. EXAM: CT HEAD WITHOUT CONTRAST CT CERVICAL SPINE WITHOUT CONTRAST TECHNIQUE: Multidetector CT imaging of the head and cervical spine was performed following the standard protocol  without intravenous contrast. Multiplanar CT image reconstructions of the cervical spine were also generated. COMPARISON:  No priors. FINDINGS: CT HEAD FINDINGS Brain: Moderate cerebral and cerebellar atrophy. Patchy and confluent areas of decreased attenuation are noted throughout the deep and periventricular white matter of the cerebral hemispheres bilaterally, compatible with chronic microvascular ischemic disease. No evidence of acute infarction, hemorrhage, hydrocephalus, extra-axial collection or mass lesion/mass effect. Vascular: No hyperdense vessel or unexpected calcification. Skull: Normal. Negative for fracture or focal lesion. Sinuses/Orbits: No acute finding.  Small R mastoid effusion.  Other: None. CT CERVICAL SPINE FINDINGS Alignment: Normal. Skull base and vertebrae: No acute fracture. No primary bone lesion or focal pathologic process. Soft tissues and spinal canal: No prevertebral fluid or swelling. No visible canal hematoma. Disc levels: Multilevel degenerative disc disease, most pronounced at C4-C5. Mild multilevel facet arthropathy. Upper chest: Negative. Other: None. IMPRESSION: 1. No evidence of significant acute traumatic injury to the skull, brain or cervical spine. 2. Moderate cerebral and cerebellar atrophy with chronic microvascular ischemic changes in the cerebral white matter, as above. 3. Small right mastoid effusion. 4. Multilevel degenerative disc disease and cervical spondylosis, as above. Electronically Signed   By: Vinnie Langton M.D.   On: 08/18/2021 11:49   CT Lumbar Spine Wo Contrast  Result Date: 08/18/2021 CLINICAL DATA:  Low back pain.  Trauma. EXAM: CT LUMBAR SPINE WITHOUT CONTRAST TECHNIQUE: Multidetector CT imaging of the lumbar spine was performed without intravenous contrast administration. Multiplanar CT image reconstructions were also generated. COMPARISON:  Nine days ago FINDINGS: Segmentation: Rudimentary disc space at S1-2. Alignment: Physiologic Vertebrae: Band of sclerosis at the L1 body consistent with recent fracture. Height loss is minimal. No retropulsion. Horizontal fracture cleft through the L2 body containing gas which extends right paravertebral. Mild paravertebral edema at the same level. Height loss is mild. No retropulsion. No evidence of aggressive bone lesion. Paraspinal and other soft tissues: Cholelithiasis. Diffuse atherosclerosis. Disc levels: Mild lumbar spine disc space narrowing and endplate ridging for age. Facet osteoarthritis with spurring at L3-4 and below. Moderate appearing spinal stenosis at L4-5 IMPRESSION: 1. Acute L2 compression fracture with mild height loss. 2. More subacute appearing L1 compression fracture with mild  height loss. 3. Cholelithiasis. Electronically Signed   By: Jorje Guild M.D.   On: 08/18/2021 10:59   DG Pelvis Portable  Result Date: 08/18/2021 CLINICAL DATA:  Fall EXAM: PORTABLE PELVIS 1-2 VIEWS COMPARISON:  None. FINDINGS: There is no evidence of pelvic fracture or diastasis. No pelvic bone lesions are seen. Generalized osteopenia. IMPRESSION: Negative. Electronically Signed   By: Jorje Guild M.D.   On: 08/18/2021 11:32   DG Chest Portable 1 View  Result Date: 08/18/2021 CLINICAL DATA:  Fall after slipping and bathroom EXAM: PORTABLE CHEST 1 VIEW COMPARISON:  06/29/2021 FINDINGS: Normal heart size and mediastinal contours. Bands of atelectasis or scarring at the lung bases. There is no edema, consolidation, effusion, or pneumothorax. IMPRESSION: No active disease. Electronically Signed   By: Jorje Guild M.D.   On: 08/18/2021 11:31    Scheduled Meds:  acetaminophen  1,000 mg Oral TID   aspirin EC  81 mg Oral Daily   calcitonin (salmon)  1 spray Alternating Nares Daily   enoxaparin (LOVENOX) injection  40 mg Subcutaneous Q24H   famotidine  20 mg Oral Daily   insulin aspart  0-15 Units Subcutaneous TID WC   insulin aspart  0-5 Units Subcutaneous QHS   insulin glargine-yfgn  10 Units Subcutaneous QHS   pravastatin  20 mg Oral QPM   tamsulosin  0.4 mg Oral Daily   Continuous Infusions:  cefTRIAXone (ROCEPHIN)  IV 1 g (08/19/21 0910)    LOS: 2 days   Time spent: 69min  Ridhima Golberg C Karlis Cregg, DO [facility]  If 7PM-7AM, please contact night-coverage [email or web address]  08/20/2021, 7:17 AM

## 2021-08-20 NOTE — TOC Progression Note (Signed)
Transition of Care [facility]) - Progression Note    Patient Details  Name: Alex Smith MRN: [id number] Date of Birth: [birth year]  Transition of Care [facility]) CM/SW [place], RN Phone Number: 08/20/2021, 1:25 PM  Clinical Narrative:   Met with the patient and his family in the room, they are agreeable to go to [facility], [place] sent, PASSR obtained, Fl2 completed    Expected Discharge Plan: Assisted Living Barriers to Discharge: Continued Medical Work up  Expected Discharge Plan and Services Expected Discharge Plan: Assisted Living       Living arrangements for the past 2 months: [place]                                       Social Determinants of Health (SDOH) Interventions    Readmission Risk Interventions Readmission Risk Prevention Plan 08/19/2021 05/11/2021  Transportation Screening Complete Complete  Medication Review Press photographer) Complete Complete  PCP or Specialist appointment within 3-5 days of discharge Complete Complete  HRI or Home Care Consult Complete Complete  SW Recovery Care/Counseling Consult Complete Complete  Palliative Care Screening Not Applicable Complete  Skilled Nursing Facility Complete Complete  Some recent data might be hidden

## 2021-08-20 NOTE — Evaluation (Signed)
Physical Therapy Evaluation Patient Details Name: Alex Smith MRN: [id number] DOB: [birth year] Today's Date: 08/20/2021  History of Present Illness  Pt is an 87 y.o. male presenting to hospital 12/31 s/p fall slipping in bathroom.  Of note, recent ED visit 12/22 s/p fall requiring staples to back of scalp.  Imaging showing acute L2 compression fx and subacute L1 compression fx.  Pt admitted with closed compression fx of L2 , acute lower UTI, weakness, and dehydration.  PMH includes anemia, CKD, htn, DM, multiple myeloma, hairy cell leukemia in remission, and Alzheimer's dementia.  Clinical Impression  Prior to hospital admission, pt was ambulatory with RW; lives at the [facility].  Pt appearing confused and impulsive at times during session.  Currently pt is max assist L sidelying to sitting; min assist with transfers; and CGA to min assist to ambulate 10 feet with RW (limited distance d/t pt's c/o back pain).  Pt reporting 5/10 low back pain beginning/end of session at rest but increased with functional mobility (pt pre-medicated with pain meds for PT session).  Pt would benefit from skilled PT to address noted impairments and functional limitations (see below for any additional details).  Upon hospital discharge, pt would benefit from SNF (anticipate pt would be able to return to [facility] if he had 24/7 physical assist for functional mobility--pt's daughter reports they can not provide the level pt requires at this time).    Recommendations for follow up therapy are one component of a multi-disciplinary discharge planning process, led by the attending physician.  Recommendations may be updated based on patient status, additional functional criteria and insurance authorization.  Follow Up Recommendations Skilled nursing-short term rehab (<3 hours/day)    Assistance Recommended at Discharge Frequent or constant Supervision/Assistance  Functional Status Assessment Patient has had a recent decline  in their functional status and demonstrates the ability to make significant improvements in function in a reasonable and predictable amount of time.  Equipment Recommendations  Rolling walker (2 wheels)    Recommendations for Other Services OT consult     Precautions / Restrictions Precautions Precautions: Fall;Back Restrictions Weight Bearing Restrictions: No      Mobility  Bed Mobility Overal bed mobility: Needs Assistance Bed Mobility: Rolling;Sidelying to Sit Rolling: Supervision (use of bed rail; vc's for technique) Sidelying to sit: Max assist       General bed mobility comments: L sidelying to sitting (simulating home set-up); assist for trunk; vc's for technique    Transfers Overall transfer level: Needs assistance Equipment used: Rolling walker (2 wheels) Transfers: Sit to/from Stand Sit to Stand: Min assist           General transfer comment: vc's for UE placement; assist to initiate stand    Ambulation/Gait Ambulation/Gait assistance: Min guard;Min assist Gait Distance (Feet): 10 Feet Assistive device: Rolling walker (2 wheels)   Gait velocity: decreased     General Gait Details: vc's to stay within RW for turns and to stay closer to RW in general; decreased B LE step length/foot clearance; limited d/t back pain  Stairs            Wheelchair Mobility    Modified Rankin (Stroke Patients Only)       Balance Overall balance assessment: Needs assistance Sitting-balance support: No upper extremity supported;Feet supported Sitting balance-Leahy Scale: Good Sitting balance - Comments: steady sitting reaching within BOS   Standing balance support: Single extremity supported Standing balance-Leahy Scale: Fair Standing balance comment: pt requiring at least single  UE support for static standing balance                             Pertinent Vitals/Pain Pain Assessment: 0-10 Pain Score: 5  Pain Location: low back Pain  Descriptors / Indicators: Aching;Sore Pain Intervention(s): Limited activity within patient's tolerance;Monitored during session;Premedicated before session;Repositioned Vitals (HR and O2 on room air) stable and WFL throughout treatment session.    Home Living Family/patient expects to be discharged to:: Assisted living                 Home Equipment: Conservation officer, nature (2 wheels) Additional Comments: [facility]    Prior Function Prior Level of Function : Needs assist       Physical Assist : ADLs (physical)     Mobility Comments: Modified independent ambulating with RW.  2 falls in last month plus 1 additional fall in last 3-4 months. ADLs Comments: Assist for meal preparation, medications, bathing (3x/week), and cleaning     Hand Dominance        Extremity/Trunk Assessment   Upper Extremity Assessment Upper Extremity Assessment: Generalized weakness    Lower Extremity Assessment Lower Extremity Assessment: Generalized weakness (at least 3/5 AROM hip flexion, knee flexion/extension, and DF/PF)    Cervical / Trunk Assessment Cervical / Trunk Assessment: Normal  Communication   Communication: No difficulties  Cognition Arousal/Alertness: Awake/alert Behavior During Therapy: Impulsive (at times) Overall Cognitive Status: History of cognitive impairments - at baseline                                 General Comments: Increased time noted to respond or initiate movement for cues at times        General Comments General comments (skin integrity, edema, etc.): laceration (from previous fall) noted back of scalp.  Nursing cleared pt for participation in physical therapy.  Pt agreeable to PT session.  Pt's daughter present during session.    Exercises  Transfer/gait training   Assessment/Plan    PT Assessment Patient needs continued PT services  PT Problem List Decreased strength;Decreased activity tolerance;Decreased balance;Decreased  mobility;Decreased knowledge of use of DME;Decreased knowledge of precautions;Pain       PT Treatment Interventions DME instruction;Gait training;Functional mobility training;Therapeutic activities;Therapeutic exercise;Balance training;Patient/family education    PT Goals (Current goals can be found in the Care Plan section)  Acute Rehab PT Goals Patient Stated Goal: to improve strength and balance PT Goal Formulation: With patient Time For Goal Achievement: 09/03/21 Potential to Achieve Goals: Good    Frequency Min 2X/week   Barriers to discharge Decreased caregiver support      Co-evaluation               AM-PAC PT "6 Clicks" Mobility  Outcome Measure Help needed turning from your back to your side while in a flat bed without using bedrails?: A Little Help needed moving from lying on your back to sitting on the side of a flat bed without using bedrails?: A Lot Help needed moving to and from a bed to a chair (including a wheelchair)?: A Little Help needed standing up from a chair using your arms (e.g., wheelchair or bedside chair)?: A Little Help needed to walk in hospital room?: A Little Help needed climbing 3-5 steps with a railing? : A Lot 6 Click Score: 16    End of Session Equipment Utilized  During Treatment: Gait belt Activity Tolerance: Patient limited by pain Patient left: in chair;with call bell/phone within reach;with chair alarm set;with family/visitor present Nurse Communication: Mobility status;Precautions PT Visit Diagnosis: Other abnormalities of gait and mobility (R26.89);Muscle weakness (generalized) (M62.81);History of falling (Z91.81);Pain Pain - part of body:  (low back)    Time: 5597-4163 PT Time Calculation (min) (ACUTE ONLY): 37 min   Charges:   PT Evaluation $PT Eval Low Complexity: 1 Low PT Treatments $Therapeutic Activity: 8-22 mins       Leitha Bleak, PT 08/20/21, 10:40 AM

## 2021-08-21 LAB — CBC
HCT: 26.5 % — ABNORMAL LOW (ref 39.0–52.0)
Hemoglobin: 8.5 g/dL — ABNORMAL LOW (ref 13.0–17.0)
MCH: 33.1 pg (ref 26.0–34.0)
MCHC: 32.1 g/dL (ref 30.0–36.0)
MCV: 103.1 fL — ABNORMAL HIGH (ref 80.0–100.0)
Platelets: 108 10*3/uL — ABNORMAL LOW (ref 150–400)
RBC: 2.57 MIL/uL — ABNORMAL LOW (ref 4.22–5.81)
RDW: 16.6 % — ABNORMAL HIGH (ref 11.5–15.5)
WBC: 2.8 10*3/uL — ABNORMAL LOW (ref 4.0–10.5)
nRBC: 0 % (ref 0.0–0.2)

## 2021-08-21 LAB — GLUCOSE, CAPILLARY
Glucose-Capillary: 122 mg/dL — ABNORMAL HIGH (ref 70–99)
Glucose-Capillary: 199 mg/dL — ABNORMAL HIGH (ref 70–99)
Glucose-Capillary: 269 mg/dL — ABNORMAL HIGH (ref 70–99)
Glucose-Capillary: 187 mg/dL — ABNORMAL HIGH (ref 70–99)

## 2021-08-21 MED ORDER — AMLODIPINE BESYLATE 5 MG PO TABS
5.0000 mg | ORAL_TABLET | Freq: Every day | ORAL | Status: DC
  Administered 2021-08-21 – 2021-08-22 (×2): 5 mg via ORAL
  Filled 2021-08-21 (×3): qty 1

## 2021-08-21 NOTE — Progress Notes (Signed)
Patient is awaiting a bed

## 2021-08-21 NOTE — TOC Progression Note (Signed)
Transition of Care [facility]) - Progression Note    Patient Details  Name: Alex Smith MRN: [id number] Date of Birth: 1934/09/01  Transition of Care [facility]) CM/SW [place], RN Phone Number: 08/21/2021, 3:18 PM  Clinical Narrative:   [name] called  back and stated that they changed their mind and want to go to [facility], I notified [employer] and requested that they change the facility on the ins auth, I called Judeen Hammans at [facility] and left a secure VM I called Tonya at [facility] and requested to cancel the bed    Expected Discharge Plan: Assisted Living Barriers to Discharge: Continued Medical Work up  Expected Discharge Plan and Services Expected Discharge Plan: Assisted Living       Living arrangements for the past 2 months: [place]                                       Social Determinants of Health (SDOH) Interventions    Readmission Risk Interventions Readmission Risk Prevention Plan 08/19/2021 05/11/2021  Transportation Screening Complete Complete  Medication Review Press photographer) Complete Complete  PCP or Specialist appointment within 3-5 days of discharge Complete Complete  HRI or Home Care Consult Complete Complete  SW Recovery Care/Counseling Consult Complete Complete  Palliative Care Screening Not Applicable Complete  Skilled Nursing Facility Complete Complete  Some recent data might be hidden

## 2021-08-21 NOTE — Care Management Important Message (Signed)
Important Message  Patient Details  Name: Alex Smith MRN: [id number] Date of Birth: 1934/10/08   Medicare Important Message Given:  N/A - LOS <3 / Initial given by admissions     Juliann Pulse A Marvin Maenza 08/21/2021, 8:08 AM

## 2021-08-21 NOTE — Plan of Care (Signed)

## 2021-08-21 NOTE — TOC Progression Note (Addendum)
Transition of Care [facility]) - Progression Note    Patient Details  Name: Alex Smith MRN: [id number] Date of Birth: 11/27/1934  Transition of Care [facility]) CM/SW [place], RN Phone Number: 08/21/2021, 3:02 PM  Clinical Narrative:   Damaris Schooner to [name] the patient's daughter, they would like to go to [facility], I notified Tonya at [facility], I submitted clinical documents for ins approval ref number [id number]    Expected Discharge Plan: Assisted Living Barriers to Discharge: Continued Medical Work up  Expected Discharge Plan and Services Expected Discharge Plan: Assisted Living       Living arrangements for the past 2 months: Assisted Living Facility                                       Social Determinants of Health (SDOH) Interventions    Readmission Risk Interventions Readmission Risk Prevention Plan 08/19/2021 05/11/2021  Transportation Screening Complete Complete  Medication Review Press photographer) Complete Complete  PCP or Specialist appointment within 3-5 days of discharge Complete Complete  HRI or Home Care Consult Complete Complete  SW Recovery Care/Counseling Consult Complete Complete  Palliative Care Screening Not Applicable Complete  Skilled Nursing Facility Complete Complete  Some recent data might be hidden

## 2021-08-21 NOTE — Progress Notes (Signed)
PROGRESS NOTE    KELLEN DUTCH  MIW:[id number] DOB: 09-22-1934 DOA: 08/18/2021 PCP: Leonel Ramsay, MD   Brief Narrative:  Mr. Harner is an 87 y.o. M with mild Alzheimer's living in ALF, hairy cell leukemia in remission, MDS on luspatercept (not on current med list), CKD IIIa baseline 1.1-1.2, HTN, IDDM with recurrent hypoglycemia who presented with fall and weakness.   In the ER, CT head and c-spine unremarkable. CT lumbar spine showed new L2 compression fracture.  Appeared dehydrated.  Urinalysis showed WBCs in clumps.  Given IV fluids, Levaquin and hospitalists called for evaluation.     12/31: Admitted on IV fluids, antibiotics 1/1: Hgb down to 6.7, no clinical bleeding, transfused 1 unit 1/2 -hemoglobin within normal limits, staples to be removed 1/3 - awaiting SNF; medically stable for discharge  Assessment & Plan:   Closed compression fracture of L2 lumbar vertebra, initial encounter ([place])- (present on admission) - Has moderate but improving pain - limiting movement in the setting of his advanced age, generalized weakness from dehydration and UTI. -Continue calcitonin, plan for 1-2 weeks - Continue scheduled acetaminophen; Pain not severe enough for brace or IV opiates - PT eval ongoing recommending SNF at this point   Acute lower UTI leading to weakness and dehydration- (present on admission) -Ceftriaxone completed-last dose 08/21/2021 -Culture showed multiple species   Alzheimer's dementia ([place])- (present on admission) -Continue donepezil   Myelodysplastic syndrome, low grade ([place])- (present on admission) -Hemoglobin 6.7 yesterday and, 7.9 today status post 1 unit transfusion -Likely production issue given no signs or symptoms of bleeding at this time   Hairy cell leukemia, in remission ([place])- (present on admission)  Stage 3a chronic kidney disease (CKD) ([place])- (present on admission) Creatinine stable, close to baseline   Type 2 diabetes mellitus (well  controlled) with stage 3 chronic kidney disease ([place])- (present on admission) A1c 4.9%.  Glucose elevated here - Hold Januvia - Continue home Lantus - Continue SS corrections, increase dose - Continue aspirin and pravastatin   Essential hypertension- (present on admission) BP still soft off home meds - Hold doxazosin, lisinopril, amlodipine   BPH with obstruction/lower urinary tract symptoms- (present on admission) - Continue Flomax   Laceration of head- (present on admission) 7 staples placed 12/23. - Remove staples January 2   Obesity (BMI 30-39.9)- (present on admission) BMI 31  DVT prophylaxis: Lovenox Code Status: Full Family Communication: Daughter at bedside  Status is: Inpatient  Dispo: The patient is from: Assisted living              Anticipated d/c is to: SNF              Anticipated d/c date is: 24 to 48 hours              Patient currently is medically stable for discharge  Consultants:  None  Procedures:  None  Antimicrobials:  Ceftriaxone x3 days  Subjective: No acute issues or events overnight  Objective: Vitals:   08/20/21 0738 08/20/21 1514 08/20/21 1947 08/21/21 0419  BP: (!) 158/62 (!) 170/78 (!) 151/61 (!) 156/83  Pulse: 74 80 66 64  Resp: 17 17 16 19   Temp: 98.5 F (36.9 C) 98.4 F (36.9 C) 98.1 F (36.7 C) 98.1 F (36.7 C)  TempSrc:      SpO2: 93% 94% 95% 95%  Weight:      Height:       No intake or output data in the 24 hours ending 08/21/21 2500  Filed Weights   08/18/21 0933  Weight: 90.7 kg    Examination:  General:  Pleasantly resting in bed, No acute distress. HEENT:  Normocephalic atraumatic.  Sclerae nonicteric, noninjected.  Extraocular movements intact bilaterally.  Left occipital staples, #7, intact Neck:  Without mass or deformity.  Trachea is midline. Lungs:  Clear to auscultate bilaterally without rhonchi, wheeze, or rales. Heart:  Regular rate and rhythm.  Without murmurs, rubs, or gallops. Abdomen:  Soft,  nontender, nondistended.  Without guarding or rebound. Extremities: Without cyanosis, clubbing, edema, or obvious deformity. Vascular:  Dorsalis pedis and posterior tibial pulses palpable bilaterally. Skin:  Warm and dry, no erythema, no ulcerations.   Data Reviewed: I have personally reviewed following labs and imaging studies  CBC: Recent Labs  Lab 08/18/21 1027 08/19/21 0426 08/20/21 0548 08/21/21 0436  WBC 4.0 2.5* 2.6* 2.8*  NEUTROABS 2.6  --   --   --   HGB 8.1* 6.9* 7.9* 8.5*  HCT 25.1* 21.9* 24.8* 26.5*  MCV 105.5* 107.4* 102.9* 103.1*  PLT 121* 106* 107* 108*    Basic Metabolic Panel: Recent Labs  Lab 08/18/21 1113 08/19/21 0426 08/20/21 0548  NA 134* 134* 140  K 3.6 3.9 3.7  CL 105 106 108  CO2 23 25 27   GLUCOSE 235* 246* 178*  BUN 21 20 17   CREATININE 1.31* 1.29* 1.09  CALCIUM 8.0* 8.1* 8.1*    GFR: Estimated Creatinine Clearance: 54.2 mL/min (by C-G formula based on SCr of 1.09 mg/dL). Liver Function Tests: Recent Labs  Lab 08/18/21 1113  AST 16  ALT 17  ALKPHOS 89  BILITOT 1.1  PROT 6.4*  ALBUMIN 2.9*    No results for input(s): LIPASE, AMYLASE in the last 168 hours. No results for input(s): AMMONIA in the last 168 hours. Coagulation Profile: Recent Labs  Lab 08/18/21 1027  INR 1.1    Cardiac Enzymes: No results for input(s): CKTOTAL, CKMB, CKMBINDEX, TROPONINI in the last 168 hours. BNP (last 3 results) No results for input(s): PROBNP in the last 8760 hours. HbA1C: No results for input(s): HGBA1C in the last 72 hours. CBG: Recent Labs  Lab 08/19/21 2108 08/20/21 0808 08/20/21 1239 08/20/21 1626 08/20/21 1949  GLUCAP 176* 215* 229* 170* 169*    Lipid Profile: No results for input(s): CHOL, HDL, LDLCALC, TRIG, CHOLHDL, LDLDIRECT in the last 72 hours. Thyroid Function Tests: No results for input(s): TSH, T4TOTAL, FREET4, T3FREE, THYROIDAB in the last 72 hours. Anemia Panel: No results for input(s): VITAMINB12, FOLATE,  FERRITIN, TIBC, IRON, RETICCTPCT in the last 72 hours. Sepsis Labs: No results for input(s): PROCALCITON, LATICACIDVEN in the last 168 hours.  Recent Results (from the past 240 hour(s))  Resp Panel by RT-PCR (Flu A&B, Covid) Nasopharyngeal Swab     Status: None   Collection Time: 08/18/21 10:27 AM   Specimen: Nasopharyngeal Swab; Nasopharyngeal(NP) swabs in vial transport medium  Result Value Ref Range Status   SARS Coronavirus 2 by RT PCR NEGATIVE NEGATIVE Final    Comment: (NOTE) SARS-CoV-2 target nucleic acids are NOT DETECTED.  The SARS-CoV-2 RNA is generally detectable in upper respiratory specimens during the acute phase of infection. The lowest concentration of SARS-CoV-2 viral copies this assay can detect is 138 copies/mL. A negative result does not preclude SARS-Cov-2 infection and should not be used as the sole basis for treatment or other patient management decisions. A negative result may occur with  improper specimen collection/handling, submission of specimen other than nasopharyngeal swab, presence of viral mutation(s) within  the areas targeted by this assay, and inadequate number of viral copies(<138 copies/mL). A negative result must be combined with clinical observations, patient history, and epidemiological information. The expected result is Negative.  Fact Sheet for Patients:  EntrepreneurPulse.com.au  Fact Sheet for Healthcare Providers:  IncredibleEmployment.be  This test is no t yet approved or cleared by the [place] FDA and  has been authorized for detection and/or diagnosis of SARS-CoV-2 by FDA under an Emergency Use Authorization (EUA). This EUA will remain  in effect (meaning this test can be used) for the duration of the COVID-19 declaration under Section 564(b)(1) of the Act, 21 U.S.C.section 360bbb-3(b)(1), unless the authorization is terminated  or revoked sooner.       Influenza A by PCR NEGATIVE  NEGATIVE Final   Influenza B by PCR NEGATIVE NEGATIVE Final    Comment: (NOTE) The Xpert Xpress SARS-CoV-2/FLU/RSV plus assay is intended as an aid in the diagnosis of influenza from Nasopharyngeal swab specimens and should not be used as a sole basis for treatment. Nasal washings and aspirates are unacceptable for Xpert Xpress SARS-CoV-2/FLU/RSV testing.  Fact Sheet for Patients: EntrepreneurPulse.com.au  Fact Sheet for Healthcare Providers: IncredibleEmployment.be  This test is not yet approved or cleared by the [place] FDA and has been authorized for detection and/or diagnosis of SARS-CoV-2 by FDA under an Emergency Use Authorization (EUA). This EUA will remain in effect (meaning this test can be used) for the duration of the COVID-19 declaration under Section 564(b)(1) of the Act, 21 U.S.C. section 360bbb-3(b)(1), unless the authorization is terminated or revoked.  Performed at [facility], [address], [place], [place] [address]   Urine Culture     Status: Abnormal   Collection Time: 08/18/21  1:33 PM   Specimen: Urine, Random  Result Value Ref Range Status   Specimen Description   Final    URINE, RANDOM Performed at [facility], [address], [place], [place] [address]    Special Requests   Final    NONE Performed at [facility], [address] [place], [place] [address]    Culture MULTIPLE SPECIES PRESENT, SUGGEST RECOLLECTION (A)  Final   Report Status 08/20/2021 FINAL  Final          Radiology Studies: No results found.  Scheduled Meds:  acetaminophen  1,000 mg Oral TID   aspirin EC  81 mg Oral Daily   calcitonin (salmon)  1 spray Alternating Nares Daily   enoxaparin (LOVENOX) injection  40 mg Subcutaneous Q24H   famotidine  20 mg Oral Daily   feeding supplement  237 mL Oral BID BM   insulin aspart  0-15 Units Subcutaneous TID WC   insulin aspart  0-5 Units Subcutaneous  QHS   insulin glargine-yfgn  10 Units Subcutaneous QHS   multivitamin with minerals  1 tablet Oral Daily   pravastatin  20 mg Oral QPM   tamsulosin  0.4 mg Oral Daily   Continuous Infusions:  cefTRIAXone (ROCEPHIN)  IV 1 g (08/20/21 0857)    LOS: 3 days   Time spent: 11min  Naveya Ellerman C Jonta Gastineau, DO [facility]  If 7PM-7AM, please contact night-coverage [email or web address]  08/21/2021, 7:12 AM

## 2021-08-21 NOTE — Progress Notes (Signed)
Physical Therapy Treatment Patient Details Name: Alex Smith MRN: [id number] DOB: March 19, 1935 Today's Date: 08/21/2021   History of Present Illness Pt is an 87 y.o. male presenting to hospital 12/31 s/p fall slipping in bathroom.  Of note, recent ED visit 12/22 s/p fall requiring staples to back of scalp.  Imaging showing acute L2 compression fx and subacute L1 compression fx.  Pt admitted with closed compression fx of L2 , acute lower UTI, weakness, and dehydration.  PMH includes anemia, CKD, htn, DM, multiple myeloma, hairy cell leukemia in remission, and Alzheimer's dementia.    PT Comments    First pm attempt pt refused because he wanted to take a nap. Second attempt pt required max encouragement to get out of bed for dinner. CGA for supine to sit with HOB raised, Sit to stand with MinA and cues for proper technique. Short distance gait in room with support of RW ~10 feet with CGA. Pt left sitting up in chair with nursing in room. Anticipate d/c to SNF tomorrow.   Recommendations for follow up therapy are one component of a multi-disciplinary discharge planning process, led by the attending physician.  Recommendations may be updated based on patient status, additional functional criteria and insurance authorization.  Follow Up Recommendations  Skilled nursing-short term rehab (<3 hours/day)     Assistance Recommended at Discharge Frequent or constant Supervision/Assistance  Patient can return home with the following A little help with walking and/or transfers;A little help with bathing/dressing/bathroom;Assistance with cooking/housework;Assist for transportation   Equipment Recommendations  Rolling walker (2 wheels)    Recommendations for Other Services OT consult     Precautions / Restrictions Precautions Precautions: Fall;Back Restrictions Weight Bearing Restrictions: No     Mobility  Bed Mobility Overal bed mobility: Needs Assistance Bed Mobility: Rolling;Sidelying to  Sit;Sit to Sidelying Rolling: Min assist Sidelying to sit: Min assist;HOB elevated       General bed mobility comments:  (Pt educated on log rolling technique)    Transfers Overall transfer level: Needs assistance Equipment used: Rolling walker (2 wheels) Transfers: Sit to/from Stand Sit to Stand: Min guard                Ambulation/Gait Ambulation/Gait assistance: Min guard;Min Web designer (Feet): 10 Feet Assistive device: Rolling walker (2 wheels)   Gait velocity: decreased     General Gait Details:  (vc's for proper use of RW)   Stairs             Wheelchair Mobility    Modified Rankin (Stroke Patients Only)       Balance                                            Cognition Arousal/Alertness: Awake/alert Behavior During Therapy: WFL for tasks assessed/performed Overall Cognitive Status: History of cognitive impairments - at baseline                                          Exercises      General Comments        Pertinent Vitals/Pain Pain Assessment: No/denies pain    Home Living  Prior Function            PT Goals (current goals can now be found in the care plan section) Acute Rehab PT Goals Patient Stated Goal: to improve strength and balance    Frequency           PT Plan      Co-evaluation              AM-PAC PT "6 Clicks" Mobility   Outcome Measure  Help needed turning from your back to your side while in a flat bed without using bedrails?: A Little Help needed moving from lying on your back to sitting on the side of a flat bed without using bedrails?: A Lot Help needed moving to and from a bed to a chair (including a wheelchair)?: A Little Help needed standing up from a chair using your arms (e.g., wheelchair or bedside chair)?: A Little Help needed to walk in hospital room?: A Little Help needed climbing 3-5 steps with a  railing? : A Lot 6 Click Score: 16    End of Session Equipment Utilized During Treatment: Gait belt Activity Tolerance: Patient tolerated treatment well Patient left: in chair;with call bell/phone within reach;with chair alarm set;with nursing/sitter in room Nurse Communication: Mobility status;Precautions PT Visit Diagnosis: Other abnormalities of gait and mobility (R26.89);Muscle weakness (generalized) (M62.81);History of falling (Z91.81);Pain     Time: 6039-0564 PT Time Calculation (min) (ACUTE ONLY): 15 min  Charges:  $Therapeutic Activity: 8-22 mins                    Mikel Cella, PTA    Josie Dixon 08/21/2021, 6:19 PM

## 2021-08-21 NOTE — TOC Progression Note (Signed)
Transition of Care [facility]) - Progression Note    Patient Details  Name: BRUIN BOLGER MRN: [id number] Date of Birth: 01/25/1935  Transition of Care [facility]) CM/SW [place], RN Phone Number: 08/21/2021, 2:40 PM  Clinical Narrative:   Reviewed the bed offers and medicare star rating with the patient and [place] reviewed with his daughter on the phone while in the room,. The daughter [name] is to review with the son's and will call me back    Expected Discharge Plan: Assisted Living Barriers to Discharge: Continued Medical Work up  Expected Discharge Plan and Services Expected Discharge Plan: Assisted Living       Living arrangements for the past 2 months: Assisted Living Facility                                       Social Determinants of Health (SDOH) Interventions    Readmission Risk Interventions Readmission Risk Prevention Plan 08/19/2021 05/11/2021  Transportation Screening Complete Complete  Medication Review Press photographer) Complete Complete  PCP or Specialist appointment within 3-5 days of discharge Complete Complete  HRI or Home Care Consult Complete Complete  SW Recovery Care/Counseling Consult Complete Complete  Palliative Care Screening Not Applicable Complete  Skilled Nursing Facility Complete Complete  Some recent data might be hidden

## 2021-08-21 NOTE — TOC Progression Note (Signed)
Transition of Care [facility]) - Progression Note    Patient Details  Name: Alex Smith MRN: [id number] Date of Birth: January 22, 1935  Transition of Care [facility]) CM/SW [place], RN Phone Number: 08/21/2021, 8:52 AM  Clinical Narrative:    Reached out to local facilities requesting to review for abed offer, waiting on a response   Expected Discharge Plan: Assisted Living Barriers to Discharge: Continued Medical Work up  Expected Discharge Plan and Services Expected Discharge Plan: Assisted Living       Living arrangements for the past 2 months: Assisted Living Facility                                       Social Determinants of Health (SDOH) Interventions    Readmission Risk Interventions Readmission Risk Prevention Plan 08/19/2021 05/11/2021  Transportation Screening Complete Complete  Medication Review (RN Care Manager) Complete Complete  PCP or Specialist appointment within 3-5 days of discharge Complete Complete  HRI or Home Care Consult Complete Complete  SW Recovery Care/Counseling Consult Complete Complete  Palliative Care Screening Not Applicable Complete  Skilled Nursing Facility Complete Complete  Some recent data might be hidden

## 2021-08-21 NOTE — Progress Notes (Signed)
Nutrition Follow-up  DOCUMENTATION CODES:   Obesity unspecified  INTERVENTION:   -Continue Ensure Enlive po BID, each supplement provides 350 kcal and 20 grams of protein  -MVI with minerals daily  NUTRITION DIAGNOSIS:   Increased nutrient needs related to chronic illness (CKD) as evidenced by estimated needs.  Ongoing  GOAL:   Patient will meet greater than or equal to 90% of their needs  Progressing   MONITOR:   PO intake, Supplement acceptance, Labs, Weight trends, I & O's  REASON FOR ASSESSMENT:   Malnutrition Screening Tool    ASSESSMENT:   87 yo male with a PMH of anemia, CKD, HTN, T2DM, multiple myeloma, hairy cell leukemia in remission, and Alzheimer's dementia who presents with closed compression facture of L2 lumbar vertebra.  Reviewed I/O's: +240 ml x 24 hours and +932 ml since admission   Spoke with pot and daughter at bedside. Pt reports improved oral intake since admission. Documented meal intake 70% of meals. Per daughter, pt has had decreased oral intake over the past 2 months due to dislike of food at facility.   Per pt, he reports he has lost about 20 pounds over the past 2 months. He reports his UBW is around 220#. Reviewed wt hx; pt has experienced a 4.8% wt loss over the past 3 months, which is not significant for time frame.   Discussed importance of good meal and supplement intake to promote healing.   Labs reviewed: CBGS: 122-229 (inpatient orders for glycemic control are 0-15 units insulin aspart TID with meals, 0-5 units insulin aspart daily at bedtime, and 10 units insulin glargine-yfgn daily at bedtime).    NUTRITION - FOCUSED PHYSICAL EXAM:  Flowsheet Row Most Recent Value  Orbital Region No depletion  Upper Arm Region Mild depletion  Thoracic and Lumbar Region No depletion  Buccal Region No depletion  Temple Region No depletion  Clavicle Bone Region No depletion  Clavicle and Acromion Bone Region No depletion  Scapular Bone Region  No depletion  Dorsal Hand No depletion  Patellar Region No depletion  Anterior Thigh Region No depletion  Posterior Calf Region No depletion  Edema (RD Assessment) None  Hair Reviewed  Eyes Reviewed  Mouth Reviewed  Skin Reviewed  Nails Reviewed       Diet Order:   Diet Order             Diet heart healthy/carb modified Room service appropriate? Yes; Fluid consistency: Thin  Diet effective now                   EDUCATION NEEDS:   Education needs have been addressed  Skin:  Skin Assessment: Reviewed RN Assessment  Last BM:  08/18/21  Height:   Ht Readings from Last 1 Encounters:  08/18/21 5' 7"  (1.702 m)    Weight:   Wt Readings from Last 1 Encounters:  08/18/21 90.7 kg   BMI:  Body mass index is 31.32 kg/m.  Estimated Nutritional Needs:   Kcal:  1850-2050  Protein:  105-120 grams  Fluid:  >1.85 L    Loistine Chance, RD, LDN, [place] Registered Dietitian II Certified Diabetes Care and Education Specialist Please refer to [facility] for RD and/or RD on-call/weekend/after hours pager

## 2021-08-21 NOTE — Evaluation (Signed)
Occupational Therapy Evaluation Patient Details Name: Alex Smith MRN: [id number] DOB: [birth year] Today's Date: 08/21/2021   History of Present Illness Pt is an 87 y.o. male presenting to hospital 12/31 s/p fall slipping in bathroom.  Of note, recent ED visit 12/22 s/p fall requiring staples to back of scalp.  Imaging showing acute L2 compression fx and subacute L1 compression fx.  Pt admitted with closed compression fx of L2 , acute lower UTI, weakness, and dehydration.  PMH includes anemia, CKD, htn, DM, multiple myeloma, hairy cell leukemia in remission, and Alzheimer's dementia.   Clinical Impression   Mr Sortor was seen for OT evaluation this date. Prior to hospital admission, pt was resident at [facility], required assist for bathing and IADLs. Pt presents to acute OT demonstrating impaired ADL performance and functional mobility 2/2 decreased activity tolerance, poor safety awareness, and functional strength/ROM/balance deficits. Pt currently requires MOD A don/doff B socks seated EOB + cues for back pcns. CGA + RW for toilet t/f and grooming standing sink side. Pt reports 7/10 pain at end of session. Pt and family educated in functional application of back precautions, log roll technique, AE/DME for bathing/dressing/ toileting, and home/routines modifications. Pt would benefit from skilled OT to address noted impairments and functional limitations (see below for any additional details) in order to maximize safety and independence while minimizing falls risk and caregiver burden. Upon hospital discharge, recommend STR to maximize pt safety and return to PLOF.       Recommendations for follow up therapy are one component of a multi-disciplinary discharge planning process, led by the attending physician.  Recommendations may be updated based on patient status, additional functional criteria and insurance authorization.   Follow Up Recommendations  Skilled nursing-short term rehab (<3  hours/day)    Assistance Recommended at Discharge Intermittent Supervision/Assistance  Patient can return home with the following A lot of help with bathing/dressing/bathroom;A lot of help with walking and/or transfers    Functional Status Assessment  Patient has had a recent decline in their functional status and demonstrates the ability to make significant improvements in function in a reasonable and predictable amount of time.  Equipment Recommendations  BSC/3in1    Recommendations for Other Services       Precautions / Restrictions Precautions Precautions: Fall;Back Restrictions Weight Bearing Restrictions: No      Mobility Bed Mobility Overal bed mobility: Needs Assistance Bed Mobility: Rolling;Sidelying to Sit;Sit to Sidelying Rolling: Min assist Sidelying to sit: Mod assist     Sit to sidelying: Mod assist      Transfers Overall transfer level: Needs assistance Equipment used: Rolling walker (2 wheels) Transfers: Sit to/from Stand Sit to Stand: Min guard           General transfer comment: grab bar use from low toilet height      Balance Overall balance assessment: Needs assistance Sitting-balance support: No upper extremity supported;Feet supported Sitting balance-Leahy Scale: Good Sitting balance - Comments: steady sitting reaching within BOS   Standing balance support: Single extremity supported Standing balance-Leahy Scale: Fair                             ADL either performed or assessed with clinical judgement   ADL Overall ADL's : Needs assistance/impaired  General ADL Comments: MOD A don/doff B socks seated EOB + cues for back pcns. CGA + RW for toilet t/f and grooming standing sinkside.      Pertinent Vitals/Pain Pain Assessment: 0-10 Pain Score: 7  Pain Location: low back Pain Descriptors / Indicators: Aching;Sore Pain Intervention(s): Limited activity within  patient's tolerance;Premedicated before session;Repositioned     Hand Dominance Right   Extremity/Trunk Assessment Upper Extremity Assessment Upper Extremity Assessment: Generalized weakness   Lower Extremity Assessment Lower Extremity Assessment: Generalized weakness       Communication Communication Communication: No difficulties   Cognition Arousal/Alertness: Awake/alert Behavior During Therapy: WFL for tasks assessed/performed Overall Cognitive Status: History of cognitive impairments - at baseline                                 General Comments: pt unable to recall back precautions or apply to tasks                Home Living Family/patient expects to be discharged to:: Assisted living                             Home Equipment: Conservation officer, nature (2 wheels)   Additional Comments: [facility]      Prior Functioning/Environment Prior Level of Function : Needs assist       Physical Assist : ADLs (physical)     Mobility Comments: Modified independent ambulating with RW.  2 falls in last month plus 1 additional fall in last 3-4 months. ADLs Comments: Assist for meal preparation, medications, bathing (3x/week), and cleaning        OT Problem List: Decreased strength;Decreased range of motion;Decreased activity tolerance;Impaired balance (sitting and/or standing);Decreased safety awareness      OT Treatment/Interventions: Self-care/ADL training;Therapeutic exercise;Energy conservation;DME and/or AE instruction;Therapeutic activities;Balance training;Patient/family education    OT Goals(Current goals can be found in the care plan section) Acute Rehab OT Goals Patient Stated Goal: to decrease falls OT Goal Formulation: With patient/family Time For Goal Achievement: 09/04/21 Potential to Achieve Goals: Good ADL Goals Pt Will Perform Grooming: with modified independence;standing (c LRAD PRN) Pt Will Perform Lower Body  Dressing: with min guard assist;with adaptive equipment;sit to/from stand (c no cues for spinal pcns) Pt Will Transfer to Toilet: with modified independence;ambulating;regular height toilet (c LRAD PRN)  OT Frequency: Min 2X/week    Co-evaluation              AM-PAC OT "6 Clicks" Daily Activity     Outcome Measure Help from another person eating meals?: None Help from another person taking care of personal grooming?: A Little Help from another person toileting, which includes using toliet, bedpan, or urinal?: A Little Help from another person bathing (including washing, rinsing, drying)?: A Lot Help from another person to put on and taking off regular upper body clothing?: A Little Help from another person to put on and taking off regular lower body clothing?: A Lot 6 Click Score: 17   End of Session Equipment Utilized During Treatment: Rolling walker (2 wheels)  Activity Tolerance: Patient tolerated treatment well Patient left: in bed;with call bell/phone within reach;with bed alarm set;with family/visitor present  OT Visit Diagnosis: Other abnormalities of gait and mobility (R26.89);Muscle weakness (generalized) (M62.81)                Time: 9381-0175 OT Time Calculation (min): 27 min  Charges:  OT General Charges $OT Visit: 1 Visit OT Evaluation $OT Eval Low Complexity: 1 Low OT Treatments $Self Care/Home Management : 8-22 mins  Dessie Coma, M.S. OTR/L  08/21/21, 10:51 AM  ascom [phone]

## 2021-08-22 ENCOUNTER — Telehealth: Payer: Self-pay | Admitting: Oncology

## 2021-08-22 LAB — CBC
HCT: 25.1 % — ABNORMAL LOW (ref 39.0–52.0)
Hemoglobin: 8 g/dL — ABNORMAL LOW (ref 13.0–17.0)
MCH: 32.7 pg (ref 26.0–34.0)
MCHC: 31.9 g/dL (ref 30.0–36.0)
MCV: 102.4 fL — ABNORMAL HIGH (ref 80.0–100.0)
Platelets: 106 10*3/uL — ABNORMAL LOW (ref 150–400)
RBC: 2.45 MIL/uL — ABNORMAL LOW (ref 4.22–5.81)
RDW: 16.4 % — ABNORMAL HIGH (ref 11.5–15.5)
WBC: 2.6 10*3/uL — ABNORMAL LOW (ref 4.0–10.5)
nRBC: 0 % (ref 0.0–0.2)

## 2021-08-22 LAB — GLUCOSE, CAPILLARY
Glucose-Capillary: 209 mg/dL — ABNORMAL HIGH (ref 70–99)
Glucose-Capillary: 251 mg/dL — ABNORMAL HIGH (ref 70–99)

## 2021-08-22 LAB — RESP PANEL BY RT-PCR (FLU A&B, COVID) ARPGX2
Influenza A by PCR: NEGATIVE
Influenza B by PCR: NEGATIVE
SARS Coronavirus 2 by RT PCR: NEGATIVE

## 2021-08-22 MED ORDER — MELATONIN 5 MG PO TABS
2.5000 mg | ORAL_TABLET | Freq: Once | ORAL | Status: AC
  Administered 2021-08-22: 2.5 mg via ORAL
  Filled 2021-08-22: qty 0.5

## 2021-08-22 MED ORDER — ADULT MULTIVITAMIN W/MINERALS CH: 1.0000 | ORAL_TABLET | Freq: Every day | ORAL | Status: AC

## 2021-08-22 MED ORDER — DOXAZOSIN MESYLATE 1 MG PO TABS
1.0000 mg | ORAL_TABLET | Freq: Every day | ORAL | Status: DC
  Administered 2021-08-22: 1 mg via ORAL
  Filled 2021-08-22: qty 1

## 2021-08-22 MED ORDER — MELATONIN 5 MG PO TABS
ORAL_TABLET | ORAL | Status: AC
  Administered 2021-08-22: 2.5 mg via ORAL
  Filled 2021-08-22: qty 1

## 2021-08-22 MED ORDER — TRAMADOL HCL 50 MG PO TABS: 50.0000 mg | ORAL_TABLET | Freq: Four times a day (QID) | ORAL | 0 refills | Status: DC | PRN

## 2021-08-22 MED ORDER — LISINOPRIL 20 MG PO TABS
20.0000 mg | ORAL_TABLET | Freq: Two times a day (BID) | ORAL | Status: DC
  Administered 2021-08-22: 20 mg via ORAL
  Filled 2021-08-22: qty 1

## 2021-08-22 MED ORDER — CALCITONIN (SALMON) 200 UNIT/ACT NA SOLN: 1.0000 | Freq: Every day | NASAL | 0 refills | Status: AC

## 2021-08-22 MED ORDER — ENSURE ENLIVE PO LIQD: 237.0000 mL | Freq: Two times a day (BID) | ORAL | 12 refills | Status: DC

## 2021-08-22 NOTE — Progress Notes (Signed)
PIV consult: According to IV flowsheet, pt has removed his PIVs. He doesn't have any IV meds ordered at this time. Recommend deferring restart until next IV med is due/ needed. Message to RN.

## 2021-08-22 NOTE — Telephone Encounter (Signed)
Daughter called to cancel pt's appt for 1-6. He is in hospital and then going to rehab. She will let us know when he gets out.

## 2021-08-22 NOTE — TOC Transition Note (Addendum)
Transition of Care [facility]) - CM/SW Discharge Note   Patient Details  Name: Alex Smith MRN: [id number] Date of Birth: 10/10/1934  Transition of Care [facility]) CM/SW Contact:  Candie Chroman, LCSW Phone Number: 08/22/2021, 1:13 PM   Clinical Narrative:   Patient has orders to discharge to [facility] today. RN will call report to [phone]. EMS transport has been arranged and he is 5th on the list. No further concerns. CSW signing off.  1:39 pm: EMS only has two convo trucks and 5-6 other transports. [facility] can transport instead and will be here in the next couple of hours. Daughter and SNF admissions coordinator is aware.  Final next level of care: Skilled Nursing Facility Barriers to Discharge: Barriers Resolved   Patient Goals and CMS Choice Patient states their goals for this hospitalization and ongoing recovery are:: based on PT evals CMS Medicare.gov Compare Post Acute Care list provided to:: Patient Represenative (must comment) Choice offered to / list presented to : Adult Children  Discharge Placement   Existing PASRR number confirmed : 08/20/21          Patient chooses bed at: [facility] Patient to be transferred to facility by: EMS Name of family member notified: Brayton Layman Patient and family notified of of transfer: 08/22/21  Discharge Plan and Services                                     Social Determinants of Health (SDOH) Interventions     Readmission Risk Interventions Readmission Risk Prevention Plan 08/19/2021 05/11/2021  Transportation Screening Complete Complete  Medication Review Press photographer) Complete Complete  PCP or Specialist appointment within 3-5 days of discharge Complete Complete  HRI or Home Care Consult Complete Complete  SW Recovery Care/Counseling Consult Complete Complete  Palliative Care Screening Not Applicable Complete  Skilled Nursing Facility Complete Complete  Some recent data might be hidden

## 2021-08-22 NOTE — TOC Progression Note (Signed)
Transition of Care [facility]) - Progression Note    Patient Details  Name: Alex Smith MRN: [id number] Date of Birth: Apr 09, 1935  Transition of Care [facility]) CM/SW [place], LCSW Phone Number: 08/22/2021, 10:39 AM  Clinical Narrative:  Insurance authorization approved: [id number]. Valid 1/4-1/6. Rapid COVID pending.   Expected Discharge Plan: Assisted Living Barriers to Discharge: Continued Medical Work up  Expected Discharge Plan and Services Expected Discharge Plan: Assisted Living       Living arrangements for the past 2 months: Assisted Living Facility                                       Social Determinants of Health (SDOH) Interventions    Readmission Risk Interventions Readmission Risk Prevention Plan 08/19/2021 05/11/2021  Transportation Screening Complete Complete  Medication Review Press photographer) Complete Complete  PCP or Specialist appointment within 3-5 days of discharge Complete Complete  HRI or Home Care Consult Complete Complete  SW Recovery Care/Counseling Consult Complete Complete  Palliative Care Screening Not Applicable Complete  Skilled Nursing Facility Complete Complete  Some recent data might be hidden

## 2021-08-22 NOTE — Progress Notes (Signed)
Contacted [facility]. Reported to facility nurse, [facility] RN.

## 2021-08-22 NOTE — Care Management Important Message (Signed)
Important Message  Patient Details  Name: Alex Smith MRN: [id number] Date of Birth: [birth year]   Medicare Important Message Given:  N/A - LOS <3 / Initial given by admissions     Juliann Pulse A Shonte Soderlund 08/22/2021, 11:46 AM

## 2021-08-22 NOTE — Progress Notes (Signed)
EMS transported pt to [place]

## 2021-08-22 NOTE — Discharge Summary (Signed)
Physician Discharge Summary  Alex Smith ZOX:[id number] DOB: 1935-06-07 DOA: 08/18/2021  PCP: Leonel Ramsay, MD  Admit date: 08/18/2021 Discharge date: 08/22/2021  Admitted From: ALF Disposition:  SNF  Recommendations for Outpatient Follow-up:  Follow up with PCP in 1-2 weeks Please obtain BMP/CBC in one week  Home Health:NO  Equipment/Devices: NONE  Discharge Condition: Stable Code Status:   Code Status: Full Code Diet recommendation:  Diet Order             Diet heart healthy/carb modified Room service appropriate? Yes; Fluid consistency: Thin  Diet effective now                    Brief/Interim Summary: 87 year old male with multiple medical history including Alzheimer's living in ALF, hairy cell leukemia in remission, MDS on luspatercept (not on current med list), CKD IIIa baseline 1.1-1.2, HTN, IDDM with recurrent hypoglycemia who presented with fall and weakness. In the ER, CT head and c-spine unremarkable. CT lumbar spine showed new L2 compression fracture.  Appeared dehydrated.  Urinalysis showed WBCs in clumps. He as given IV fluids, Levaquin and hospitalists called for evaluation and admitted on 12/31, his hemoglobin downgraded to 6.7 g no obvious bleeding given 1 unit PRBC since then has improved and stabilized in the 8 g range.  Seen by PT OT skilled nursing felt he has been recommended and was waiting for placement.  Bed available and approved 1/4 and is being discharged today  Discharge Diagnoses:   Closed compression fracture of L2 lumbar vertebra: Pain is stable continue pain control with tramadol, continue calcitonin for few more days. Continue PT  skilled nursing facility   Physical deconditioning-continue dehydration UTI: Continue PT OT and skilled nursing facility Acute lower UTI : Antibiotic completed 1/3 culture with multiple species  Alzheimer's dementia: Mood is stable pleasant fairly communicative continues Aricept.  Supportive care, fall  precaution MDS low grade with anemia: Hemoglobin improved with transfusion remains a stable no signs of bleeding.  Check CBC in 1 week follow-up with his PCP or hematology Hairy cell leukemia, in remission:Follow-up outpatient CKD stage IIIA: Stable. Recent Labs  Lab 08/18/21 1113 08/19/21 0426 08/20/21 0548  BUN 21 20 17   CREATININE 1.31* 1.29* 1.09    T2DM with CKD:A1c 4.9%.  Glucose elevated here.resume his home insulin and oral medication  HLD continue statin. Essential hypertension:BP now hypertensive in 140s to 190s -on amlodipine, resume home lisinopril and doxazosin  BPH with obstruction/lower urinary tract symptoms:Continue Flomax Laceration of head- (present on admission) 7 staples placed 12/23.Remove staples January 2 Obesity (BMI 30-39.9)-class I with BMI 31  Consults: TOC  Subjective: Patient is alert, awake fairly oriented no complaints. Discharge Exam: Vitals:   08/22/21 0408 08/22/21 0729  BP: (!) 167/72 (!) 172/73  Pulse: 69 68  Resp:  18  Temp: 98.4 F (36.9 C) 98.5 F (36.9 C)  SpO2: 94% 99%   General: Pt is alert, awake, not in acute distress Cardiovascular: RRR, S1/S2 +, no rubs, no gallops Respiratory: CTA bilaterally, no wheezing, no rhonchi Abdominal: Soft, NT, ND, bowel sounds + Extremities: no edema, no cyanosis  Discharge Instructions  Discharge Instructions     Discharge instructions   Complete by: As directed    Laceration of head- (present on admission) 7 staples placed 12/23.Remove staples January 2  CBC BMP in 1 week  Please call call MD or return to ER for similar or worsening recurring problem that brought you to hospital or if any fever,nausea/vomiting,abdominal  pain, uncontrolled pain, chest pain,  shortness of breath or any other alarming symptoms.  Please follow-up your doctor as instructed in a week time and call the office for appointment.  Please avoid alcohol, smoking, or any other illicit substance and maintain healthy  habits including taking your regular medications as prescribed.  You were cared for by a hospitalist during your hospital stay. If you have any questions about your discharge medications or the care you received while you were in the hospital after you are discharged, you can call the unit and ask to speak with the hospitalist on call if the hospitalist that took care of you is not available.  Once you are discharged, your primary care physician will handle any further medical issues. Please note that NO REFILLS for any discharge medications will be authorized once you are discharged, as it is imperative that you return to your primary care physician (or establish a relationship with a primary care physician if you do not have one) for your aftercare needs so that they can reassess your need for medications and monitor your lab values   Increase activity slowly   Complete by: As directed       Allergies as of 08/22/2021       Reactions   Rituximab Rash   Chest tightness Chest tightness   Blood-group Specific Substance Other (See Comments)   Had a post transfusion reaction of red blood cells; NOW REQUIRES WASHED BLOOD CELLS Had a post transfusion reaction of red blood cells; NOW REQUIRES WASHED BLOOD CELLS   Primaxin [imipenem] Other (See Comments)   Possible allergy Tolerates cephalosporins   Voriconazole Other (See Comments)   Sulfa Antibiotics Itching, Rash   Sulfacetamide Sodium Itching, Rash        Medication List     TAKE these medications    acetaminophen 325 MG tablet Commonly known as: TYLENOL Take 650 mg by mouth 3 (three) times daily.   amLODipine 5 MG tablet Commonly known as: NORVASC Take 1 tablet (5 mg total) by mouth daily.   aspirin EC 81 MG tablet Take 81 mg by mouth daily.   calcitonin (salmon) 200 UNIT/ACT nasal spray Commonly known as: MIACALCIN/FORTICAL Place 1 spray into alternate nostrils daily for 4 days. Start taking on: August 23, 2021    doxazosin 1 MG tablet Commonly known as: CARDURA TAKE ONE TABLET BY MOUTH EVERY DAY   famotidine 20 MG tablet Commonly known as: PEPCID Take 20 mg by mouth daily.   feeding supplement Liqd Take 237 mLs by mouth 2 (two) times daily between meals.   insulin glargine 100 UNIT/ML Solostar Pen Commonly known as: LANTUS Inject 13 Units into the skin at bedtime.   lisinopril 20 MG tablet Commonly known as: ZESTRIL TAKE ONE TABLET BY MOUTH TWICE DAILY   multivitamin with minerals Tabs tablet Take 1 tablet by mouth daily. Start taking on: August 23, 2021   NON FORMULARY Apply 1 application topically as needed. AMITIP5% / GABA10% / LIDO5% Apply topically to arms, abdomen and legs as needed. *Provided by patient's family*   pravastatin 20 MG tablet Commonly known as: PRAVACHOL TAKE ONE TABLET EVERY EVENING   sitaGLIPtin 25 MG tablet Commonly known as: JANUVIA Take 50 mg by mouth daily.   tamsulosin 0.4 MG Caps capsule Commonly known as: FLOMAX TAKE 1 CAPSULE BY MOUTH EVERY DAY   traMADol 50 MG tablet Commonly known as: ULTRAM Take 1 tablet (50 mg total) by mouth every 6 (six) hours as needed  for up to 4 doses for moderate pain.   vitamin B-12 1000 MCG tablet Commonly known as: CYANOCOBALAMIN Take 1 tablet (1,000 mcg total) by mouth daily.   vitamin E 180 MG (400 UNITS) capsule Take 400 Units by mouth daily.        Contact information for after-discharge care     Destination     [facility] Preferred SNF .   Service: Skilled Nursing Contact information: [place] [address] [phone]                    Allergies  Allergen Reactions   Rituximab Rash    Chest tightness Chest tightness   Blood-Group Specific Substance Other (See Comments)    Had a post transfusion reaction of red blood cells; NOW REQUIRES WASHED BLOOD CELLS Had a post transfusion reaction of red blood cells; NOW REQUIRES WASHED  BLOOD CELLS   Primaxin [Imipenem] Other (See Comments)    Possible allergy Tolerates cephalosporins   Voriconazole Other (See Comments)   Sulfa Antibiotics Itching and Rash   Sulfacetamide Sodium Itching and Rash    The results of significant diagnostics from this hospitalization (including imaging, microbiology, ancillary and laboratory) are listed below for reference.    Microbiology: Recent Results (from the past 240 hour(s))  Resp Panel by RT-PCR (Flu A&B, Covid) Nasopharyngeal Swab     Status: None   Collection Time: 08/18/21 10:27 AM   Specimen: Nasopharyngeal Swab; Nasopharyngeal(NP) swabs in vial transport medium  Result Value Ref Range Status   SARS Coronavirus 2 by RT PCR NEGATIVE NEGATIVE Final    Comment: (NOTE) SARS-CoV-2 target nucleic acids are NOT DETECTED.  The SARS-CoV-2 RNA is generally detectable in upper respiratory specimens during the acute phase of infection. The lowest concentration of SARS-CoV-2 viral copies this assay can detect is 138 copies/mL. A negative result does not preclude SARS-Cov-2 infection and should not be used as the sole basis for treatment or other patient management decisions. A negative result may occur with  improper specimen collection/handling, submission of specimen other than nasopharyngeal swab, presence of viral mutation(s) within the areas targeted by this assay, and inadequate number of viral copies(<138 copies/mL). A negative result must be combined with clinical observations, patient history, and epidemiological information. The expected result is Negative.  Fact Sheet for Patients:  EntrepreneurPulse.com.au  Fact Sheet for Healthcare Providers:  IncredibleEmployment.be  This test is no t yet approved or cleared by the [place] FDA and  has been authorized for detection and/or diagnosis of SARS-CoV-2 by FDA under an Emergency Use Authorization (EUA). This EUA will remain  in  effect (meaning this test can be used) for the duration of the COVID-19 declaration under Section 564(b)(1) of the Act, 21 U.S.C.section 360bbb-3(b)(1), unless the authorization is terminated  or revoked sooner.       Influenza A by PCR NEGATIVE NEGATIVE Final   Influenza B by PCR NEGATIVE NEGATIVE Final    Comment: (NOTE) The Xpert Xpress SARS-CoV-2/FLU/RSV plus assay is intended as an aid in the diagnosis of influenza from Nasopharyngeal swab specimens and should not be used as a sole basis for treatment. Nasal washings and aspirates are unacceptable for Xpert Xpress SARS-CoV-2/FLU/RSV testing.  Fact Sheet for Patients: EntrepreneurPulse.com.au  Fact Sheet for Healthcare Providers: IncredibleEmployment.be  This test is not yet approved or cleared by the [place] FDA and has been authorized for detection and/or diagnosis of SARS-CoV-2 by FDA under an Emergency Use  Authorization (EUA). This EUA will remain in effect (meaning this test can be used) for the duration of the COVID-19 declaration under Section 564(b)(1) of the Act, 21 U.S.C. section 360bbb-3(b)(1), unless the authorization is terminated or revoked.  Performed at [facility], [address], [place], [place] [address]   Urine Culture     Status: Abnormal   Collection Time: 08/18/21  1:33 PM   Specimen: Urine, Random  Result Value Ref Range Status   Specimen Description   Final    URINE, RANDOM Performed at [facility], [address], [place], [place] [address]    Special Requests   Final    NONE Performed at [facility], [address] [place], [place] [address]    Culture MULTIPLE SPECIES PRESENT, SUGGEST RECOLLECTION (A)  Final   Report Status 08/20/2021 FINAL  Final    Procedures/Studies: DG Elbow Complete Left  Result Date: 08/09/2021 CLINICAL DATA:  Fall, pain EXAM: LEFT ELBOW - COMPLETE 3+ VIEW COMPARISON:  None. FINDINGS:  There is no evidence of fracture, dislocation, or joint effusion. There is no evidence of arthropathy or other focal bone abnormality. Soft tissues are unremarkable. IMPRESSION: No evidence of acute fracture or dislocation. Electronically Signed   By: Keane Police D.O.   On: 08/09/2021 14:10   CT HEAD WO CONTRAST (5MM)  Result Date: 08/18/2021 CLINICAL DATA:  87 year old male with history of head trauma from a fall. EXAM: CT HEAD WITHOUT CONTRAST CT CERVICAL SPINE WITHOUT CONTRAST TECHNIQUE: Multidetector CT imaging of the head and cervical spine was performed following the standard protocol without intravenous contrast. Multiplanar CT image reconstructions of the cervical spine were also generated. COMPARISON:  No priors. FINDINGS: CT HEAD FINDINGS Brain: Moderate cerebral and cerebellar atrophy. Patchy and confluent areas of decreased attenuation are noted throughout the deep and periventricular white matter of the cerebral hemispheres bilaterally, compatible with chronic microvascular ischemic disease. No evidence of acute infarction, hemorrhage, hydrocephalus, extra-axial collection or mass lesion/mass effect. Vascular: No hyperdense vessel or unexpected calcification. Skull: Normal. Negative for fracture or focal lesion. Sinuses/Orbits: No acute finding.  Small R mastoid effusion. Other: None. CT CERVICAL SPINE FINDINGS Alignment: Normal. Skull base and vertebrae: No acute fracture. No primary bone lesion or focal pathologic process. Soft tissues and spinal canal: No prevertebral fluid or swelling. No visible canal hematoma. Disc levels: Multilevel degenerative disc disease, most pronounced at C4-C5. Mild multilevel facet arthropathy. Upper chest: Negative. Other: None. IMPRESSION: 1. No evidence of significant acute traumatic injury to the skull, brain or cervical spine. 2. Moderate cerebral and cerebellar atrophy with chronic microvascular ischemic changes in the cerebral white matter, as above. 3. Small  right mastoid effusion. 4. Multilevel degenerative disc disease and cervical spondylosis, as above. Electronically Signed   By: Vinnie Langton M.D.   On: 08/18/2021 11:49   CT HEAD WO CONTRAST  Result Date: 08/09/2021 CLINICAL DATA:  Dizziness, fall, hit head EXAM: CT HEAD WITHOUT CONTRAST TECHNIQUE: Contiguous axial images were obtained from the base of the skull through the vertex without intravenous contrast. COMPARISON:  Head CT 05/23/2021, brain MRI 04/30/2021 FINDINGS: Brain: There is no evidence of acute intracranial hemorrhage, extra-axial fluid collection, or acute infarct. Parenchymal volume is within normal limits. The ventricles are normal in size. There is mild chronic white matter microangiopathy. There is no solid mass lesion.  There is no midline shift. Vascular: There is calcification of the bilateral cavernous ICAs. Skull: Normal. Negative for fracture or focal lesion. Sinuses/Orbits: The  imaged paranasal sinuses are clear. Bilateral lens implants are in place. The globes and orbits are otherwise unremarkable. Other: There is a left occipital scalp laceration/small hematoma. IMPRESSION: 1. No acute intracranial hemorrhage or calvarial fracture. 2. Left occipital scalp laceration/small hematoma. Electronically Signed   By: Valetta Mole M.D.   On: 08/09/2021 09:23   CT Cervical Spine Wo Contrast  Result Date: 08/18/2021 CLINICAL DATA:  87 year old male with history of head trauma from a fall. EXAM: CT HEAD WITHOUT CONTRAST CT CERVICAL SPINE WITHOUT CONTRAST TECHNIQUE: Multidetector CT imaging of the head and cervical spine was performed following the standard protocol without intravenous contrast. Multiplanar CT image reconstructions of the cervical spine were also generated. COMPARISON:  No priors. FINDINGS: CT HEAD FINDINGS Brain: Moderate cerebral and cerebellar atrophy. Patchy and confluent areas of decreased attenuation are noted throughout the deep and periventricular white matter  of the cerebral hemispheres bilaterally, compatible with chronic microvascular ischemic disease. No evidence of acute infarction, hemorrhage, hydrocephalus, extra-axial collection or mass lesion/mass effect. Vascular: No hyperdense vessel or unexpected calcification. Skull: Normal. Negative for fracture or focal lesion. Sinuses/Orbits: No acute finding.  Small R mastoid effusion. Other: None. CT CERVICAL SPINE FINDINGS Alignment: Normal. Skull base and vertebrae: No acute fracture. No primary bone lesion or focal pathologic process. Soft tissues and spinal canal: No prevertebral fluid or swelling. No visible canal hematoma. Disc levels: Multilevel degenerative disc disease, most pronounced at C4-C5. Mild multilevel facet arthropathy. Upper chest: Negative. Other: None. IMPRESSION: 1. No evidence of significant acute traumatic injury to the skull, brain or cervical spine. 2. Moderate cerebral and cerebellar atrophy with chronic microvascular ischemic changes in the cerebral white matter, as above. 3. Small right mastoid effusion. 4. Multilevel degenerative disc disease and cervical spondylosis, as above. Electronically Signed   By: Vinnie Langton M.D.   On: 08/18/2021 11:49   CT Cervical Spine Wo Contrast  Result Date: 08/09/2021 CLINICAL DATA:  Fall, neck trauma EXAM: CT CERVICAL SPINE WITHOUT CONTRAST TECHNIQUE: Multidetector CT imaging of the cervical spine was performed without intravenous contrast. Multiplanar CT image reconstructions were also generated. COMPARISON:  Cervical spine CT 05/23/2021 FINDINGS: Alignment: Normal. Skull base and vertebrae: Skull base alignment is maintained. There is mild compression deformity of the T1 vertebral body which is new since 05/23/2021 suspicious for acute fracture. There is no bony retropulsion. Vertebral body heights are otherwise preserved. There is no other evidence of acute fracture. The left C2 and C3 posterior elements are fused, unchanged. There are no  suspicious osseous lesions. Soft tissues and spinal canal: There is no definite canal hematoma. The prevertebral soft tissues are unremarkable. Disc levels: Multilevel degenerative changes are again seen throughout the cervical spine. There is severe left neural foraminal stenosis at C3-C4, unchanged. The osseous spinal canal is patent. Upper chest: The imaged lung apices are clear. Other: None. IMPRESSION: Mild compression deformity of the T1 vertebral body is new since 05/23/2021 and may be acute. Correlate with point tenderness. MRI may be considered for further evaluation as indicated. Electronically Signed   By: Valetta Mole M.D.   On: 08/09/2021 09:30   CT Lumbar Spine Wo Contrast  Result Date: 08/18/2021 CLINICAL DATA:  Low back pain.  Trauma. EXAM: CT LUMBAR SPINE WITHOUT CONTRAST TECHNIQUE: Multidetector CT imaging of the lumbar spine was performed without intravenous contrast administration. Multiplanar CT image reconstructions were also generated. COMPARISON:  Nine days ago FINDINGS: Segmentation: Rudimentary disc space at S1-2. Alignment: Physiologic Vertebrae: Band of sclerosis at the L1  body consistent with recent fracture. Height loss is minimal. No retropulsion. Horizontal fracture cleft through the L2 body containing gas which extends right paravertebral. Mild paravertebral edema at the same level. Height loss is mild. No retropulsion. No evidence of aggressive bone lesion. Paraspinal and other soft tissues: Cholelithiasis. Diffuse atherosclerosis. Disc levels: Mild lumbar spine disc space narrowing and endplate ridging for age. Facet osteoarthritis with spurring at L3-4 and below. Moderate appearing spinal stenosis at L4-5 IMPRESSION: 1. Acute L2 compression fracture with mild height loss. 2. More subacute appearing L1 compression fracture with mild height loss. 3. Cholelithiasis. Electronically Signed   By: Jorje Guild M.D.   On: 08/18/2021 10:59   CT Lumbar Spine Wo Contrast  Result  Date: 08/09/2021 CLINICAL DATA:  Back trauma. Patient got dizzy and fell this morning. EXAM: CT LUMBAR SPINE WITHOUT CONTRAST TECHNIQUE: Multidetector CT imaging of the lumbar spine was performed without intravenous contrast administration. Multiplanar CT image reconstructions were also generated. COMPARISON:  None. FINDINGS: Segmentation: 5 lumbar type vertebrae. Alignment: Normal. Vertebrae: There is mild sclerosis of the superior endplate and wedging of the L1 vertebral body concerning for mild compression deformity of indeterminate age. There is also small Schmorl node in the superior endplate of L5 vertebral body. Diffuse osteopenia. Paraspinal and other soft tissues: Negative. Disc levels: Multilevel DA and disc disease with disc space narrowing and prominent anterior osteophytes. Multilevel facet joint arthropathy. No significant spinal canal or neural foraminal narrowing. IMPRESSION: 1. Mild sclerosis and anterior wedging of the L1 vertebral body, concerning for compression deformity of indeterminate age. There is no retropulsion into the spinal canal. There is no surrounding edema. If there is localized pain further evaluation with MR would be helpful. 2. Multilevel DA and disc disease of the lumbar spine with prominent osteophytes and facet joint arthropathy. No significant spinal canal or neural foraminal narrowing. Electronically Signed   By: Keane Police D.O.   On: 08/09/2021 11:21   MR BRAIN WO CONTRAST  Result Date: 08/09/2021 CLINICAL DATA:  Dizziness.  Fall and hit head. EXAM: MRI HEAD WITHOUT CONTRAST TECHNIQUE: Multiplanar, multiecho pulse sequences of the brain and surrounding structures were obtained without intravenous contrast. COMPARISON:  CT head 08/09/2021 FINDINGS: Brain: Negative for acute infarct. Mild chronic microvascular ischemic change in the white matter. Small cortical infarcts in the left frontal lobe. Negative for intracranial hemorrhage or fluid collection. Negative for  mass lesion. Generalized atrophy. Negative for hydrocephalus. Prominent solitary vessel right frontal lobe best seen on susceptibility weighted in imaging. Probable developmental venous anomaly. Vascular: Normal arterial flow voids. Skull and upper cervical spine: No focal skeletal abnormality. Sinuses/Orbits: Mild mucosal edema paranasal sinuses. Bilateral cataract extraction Other: None IMPRESSION: Negative for acute infarct.  No acute intracranial hemorrhage Atrophy with mild chronic ischemic change. 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 Electronically Signed   By: Franchot Gallo M.D.   On: 08/09/2021 12:57   DG Pelvis Portable  Result Date: 08/18/2021 CLINICAL DATA:  Fall EXAM: PORTABLE PELVIS 1-2 VIEWS COMPARISON:  None. FINDINGS: There is no evidence of pelvic fracture or diastasis. No pelvic bone lesions are seen. Generalized osteopenia. IMPRESSION: Negative. Electronically Signed   By: Jorje Guild M.D.   On: 08/18/2021 11:32   DG Chest Portable 1 View  Result Date: 08/18/2021 CLINICAL DATA:  Fall after slipping and bathroom EXAM: PORTABLE CHEST 1 VIEW COMPARISON:  06/29/2021 FINDINGS: Normal heart size and mediastinal contours. Bands of atelectasis or scarring at the lung bases. There is no edema, consolidation,  effusion, or pneumothorax. IMPRESSION: No active disease. Electronically Signed   By: Jorje Guild M.D.   On: 08/18/2021 11:31   DG Hip Unilat With Pelvis 2-3 Views Left  Result Date: 08/09/2021 CLINICAL DATA:  Left hip pain after a fall. EXAM: DG HIP (WITH OR WITHOUT PELVIS) 2-3V LEFT COMPARISON:  Left hip x-rays dated May 10, 2021. FINDINGS: No acute fracture or dislocation. Unchanged mild bilateral hip joint space narrowing superiorly with small marginal osteophytes. Soft tissues are unremarkable. IMPRESSION: 1. No acute osseous abnormality. Electronically Signed   By: Titus Dubin M.D.   On: 08/09/2021 09:16    Labs: BNP (last 3 results) No  results for input(s): BNP in the last 8760 hours. Basic Metabolic Panel: Recent Labs  Lab 08/18/21 1113 08/19/21 0426 08/20/21 0548  NA 134* 134* 140  K 3.6 3.9 3.7  CL 105 106 108  CO2 23 25 27   GLUCOSE 235* 246* 178*  BUN 21 20 17   CREATININE 1.31* 1.29* 1.09  CALCIUM 8.0* 8.1* 8.1*   Liver Function Tests: Recent Labs  Lab 08/18/21 1113  AST 16  ALT 17  ALKPHOS 89  BILITOT 1.1  PROT 6.4*  ALBUMIN 2.9*   No results for input(s): LIPASE, AMYLASE in the last 168 hours. No results for input(s): AMMONIA in the last 168 hours. CBC: Recent Labs  Lab 08/18/21 1027 08/19/21 0426 08/20/21 0548 08/21/21 0436 08/22/21 0359  WBC 4.0 2.5* 2.6* 2.8* 2.6*  NEUTROABS 2.6  --   --   --   --   HGB 8.1* 6.9* 7.9* 8.5* 8.0*  HCT 25.1* 21.9* 24.8* 26.5* 25.1*  MCV 105.5* 107.4* 102.9* 103.1* 102.4*  PLT 121* 106* 107* 108* 106*   Cardiac Enzymes: No results for input(s): CKTOTAL, CKMB, CKMBINDEX, TROPONINI in the last 168 hours. BNP: Invalid input(s): POCBNP CBG: Recent Labs  Lab 08/21/21 0810 08/21/21 1140 08/21/21 1607 08/21/21 2103 08/22/21 0727  GLUCAP 122* 199* 269* 187* 209*   D-Dimer No results for input(s): DDIMER in the last 72 hours. Hgb A1c No results for input(s): HGBA1C in the last 72 hours. Lipid Profile No results for input(s): CHOL, HDL, LDLCALC, TRIG, CHOLHDL, LDLDIRECT in the last 72 hours. Thyroid function studies No results for input(s): TSH, T4TOTAL, T3FREE, THYROIDAB in the last 72 hours.  Invalid input(s): FREET3 Anemia work up No results for input(s): VITAMINB12, FOLATE, FERRITIN, TIBC, IRON, RETICCTPCT in the last 72 hours. Urinalysis    Component Value Date/Time   COLORURINE YELLOW (A) 08/18/2021 1333   APPEARANCEUR CLOUDY (A) 08/18/2021 1333   LABSPEC 1.014 08/18/2021 1333   PHURINE 5.0 08/18/2021 1333   GLUCOSEU >=500 (A) 08/18/2021 1333   HGBUR NEGATIVE 08/18/2021 [place] 08/18/2021 1333   BILIRUBINUR  Negative 08/02/2016 1212   KETONESUR NEGATIVE 08/18/2021 1333   PROTEINUR 30 (A) 08/18/2021 1333   UROBILINOGEN 0.2 08/02/2016 1212   NITRITE NEGATIVE 08/18/2021 1333   LEUKOCYTESUR LARGE (A) 08/18/2021 1333   Sepsis Labs Invalid input(s): PROCALCITONIN,  WBC,  LACTICIDVEN Microbiology Recent Results (from the past 240 hour(s))  Resp Panel by RT-PCR (Flu A&B, Covid) Nasopharyngeal Swab     Status: None   Collection Time: 08/18/21 10:27 AM   Specimen: Nasopharyngeal Swab; Nasopharyngeal(NP) swabs in vial transport medium  Result Value Ref Range Status   SARS Coronavirus 2 by RT PCR NEGATIVE NEGATIVE Final    Comment: (NOTE) SARS-CoV-2 target nucleic acids are NOT DETECTED.  The SARS-CoV-2 RNA is generally detectable in upper respiratory specimens  during the acute phase of infection. The lowest concentration of SARS-CoV-2 viral copies this assay can detect is 138 copies/mL. A negative result does not preclude SARS-Cov-2 infection and should not be used as the sole basis for treatment or other patient management decisions. A negative result may occur with  improper specimen collection/handling, submission of specimen other than nasopharyngeal swab, presence of viral mutation(s) within the areas targeted by this assay, and inadequate number of viral copies(<138 copies/mL). A negative result must be combined with clinical observations, patient history, and epidemiological information. The expected result is Negative.  Fact Sheet for Patients:  EntrepreneurPulse.com.au  Fact Sheet for Healthcare Providers:  IncredibleEmployment.be  This test is no t yet approved or cleared by the [place] FDA and  has been authorized for detection and/or diagnosis of SARS-CoV-2 by FDA under an Emergency Use Authorization (EUA). This EUA will remain  in effect (meaning this test can be used) for the duration of the COVID-19 declaration under Section 564(b)(1)  of the Act, 21 U.S.C.section 360bbb-3(b)(1), unless the authorization is terminated  or revoked sooner.       Influenza A by PCR NEGATIVE NEGATIVE Final   Influenza B by PCR NEGATIVE NEGATIVE Final    Comment: (NOTE) The Xpert Xpress SARS-CoV-2/FLU/RSV plus assay is intended as an aid in the diagnosis of influenza from Nasopharyngeal swab specimens and should not be used as a sole basis for treatment. Nasal washings and aspirates are unacceptable for Xpert Xpress SARS-CoV-2/FLU/RSV testing.  Fact Sheet for Patients: EntrepreneurPulse.com.au  Fact Sheet for Healthcare Providers: IncredibleEmployment.be  This test is not yet approved or cleared by the [place] FDA and has been authorized for detection and/or diagnosis of SARS-CoV-2 by FDA under an Emergency Use Authorization (EUA). This EUA will remain in effect (meaning this test can be used) for the duration of the COVID-19 declaration under Section 564(b)(1) of the Act, 21 U.S.C. section 360bbb-3(b)(1), unless the authorization is terminated or revoked.  Performed at [facility], [address] [place], [place] [address]   Urine Culture     Status: Abnormal   Collection Time: 08/18/21  1:33 PM   Specimen: Urine, Random  Result Value Ref Range Status   Specimen Description   Final    URINE, RANDOM Performed at [facility], [address] [place], [place] [address]    Special Requests   Final    NONE Performed at [facility], [address] [place], [place] [address]    Culture MULTIPLE SPECIES PRESENT, SUGGEST RECOLLECTION (A)  Final   Report Status 08/20/2021 FINAL  Final     Time coordinating discharge: 35 minutes  SIGNED: Antonieta Pert, MD  [facility] 08/22/2021, 10:51 AM  If 7PM-7AM, please contact night-coverage [email or web address]

## 2021-08-24 ENCOUNTER — Other Ambulatory Visit: Payer: Medicare Other

## 2021-08-24 ENCOUNTER — Ambulatory Visit: Payer: Medicare Other | Admitting: Oncology

## 2021-08-24 ENCOUNTER — Ambulatory Visit: Payer: Medicare Other

## 2021-08-29 ENCOUNTER — Ambulatory Visit: Payer: Medicare Other | Admitting: Dermatology

## 2021-09-05 DIAGNOSIS — G894 Chronic pain syndrome: Secondary | ICD-10-CM | POA: Insufficient documentation

## 2021-09-05 DIAGNOSIS — R296 Repeated falls: Secondary | ICD-10-CM | POA: Insufficient documentation

## 2021-09-05 DIAGNOSIS — R269 Unspecified abnormalities of gait and mobility: Secondary | ICD-10-CM | POA: Insufficient documentation

## 2021-09-06 DIAGNOSIS — N3000 Acute cystitis without hematuria: Secondary | ICD-10-CM | POA: Insufficient documentation

## 2021-09-06 DIAGNOSIS — Z7401 Bed confinement status: Secondary | ICD-10-CM | POA: Insufficient documentation

## 2021-09-19 DIAGNOSIS — Z794 Long term (current) use of insulin: Secondary | ICD-10-CM | POA: Insufficient documentation

## 2021-09-19 DIAGNOSIS — D696 Thrombocytopenia, unspecified: Secondary | ICD-10-CM | POA: Insufficient documentation

## 2021-09-30 ENCOUNTER — Other Ambulatory Visit: Payer: Self-pay | Admitting: *Deleted

## 2021-09-30 DIAGNOSIS — D472 Monoclonal gammopathy: Secondary | ICD-10-CM

## 2021-09-30 DIAGNOSIS — D46Z Other myelodysplastic syndromes: Secondary | ICD-10-CM

## 2021-09-30 NOTE — Progress Notes (Signed)
Adding labs for his visit

## 2021-10-01 ENCOUNTER — Inpatient Hospital Stay (HOSPITAL_BASED_OUTPATIENT_CLINIC_OR_DEPARTMENT_OTHER): Payer: Medicare Other | Admitting: Oncology

## 2021-10-01 ENCOUNTER — Encounter: Payer: Self-pay | Admitting: Oncology

## 2021-10-01 ENCOUNTER — Other Ambulatory Visit: Payer: Self-pay

## 2021-10-01 ENCOUNTER — Inpatient Hospital Stay: Payer: Medicare Other

## 2021-10-01 ENCOUNTER — Inpatient Hospital Stay: Payer: Medicare Other | Attending: Oncology

## 2021-10-01 VITALS — BP 159/68 | HR 75 | Temp 97.8°F | Resp 16 | Ht 67.0 in | Wt 190.9 lb

## 2021-10-01 DIAGNOSIS — Z79899 Other long term (current) drug therapy: Secondary | ICD-10-CM | POA: Diagnosis not present

## 2021-10-01 DIAGNOSIS — D4620 Refractory anemia with excess of blasts, unspecified: Secondary | ICD-10-CM | POA: Diagnosis not present

## 2021-10-01 DIAGNOSIS — D472 Monoclonal gammopathy: Secondary | ICD-10-CM

## 2021-10-01 DIAGNOSIS — D46Z Other myelodysplastic syndromes: Secondary | ICD-10-CM | POA: Diagnosis not present

## 2021-10-01 DIAGNOSIS — D649 Anemia, unspecified: Secondary | ICD-10-CM

## 2021-10-01 LAB — TSH: TSH: 2.712 u[IU]/mL (ref 0.350–4.500)

## 2021-10-01 LAB — CBC
HCT: 25.7 % — ABNORMAL LOW (ref 39.0–52.0)
Hemoglobin: 8.4 g/dL — ABNORMAL LOW (ref 13.0–17.0)
MCH: 34 pg (ref 26.0–34.0)
MCHC: 32.7 g/dL (ref 30.0–36.0)
MCV: 104 fL — ABNORMAL HIGH (ref 80.0–100.0)
Platelets: 89 10*3/uL — ABNORMAL LOW (ref 150–400)
RBC: 2.47 MIL/uL — ABNORMAL LOW (ref 4.22–5.81)
RDW: 16.1 % — ABNORMAL HIGH (ref 11.5–15.5)
WBC: 2.8 10*3/uL — ABNORMAL LOW (ref 4.0–10.5)
nRBC: 0 % (ref 0.0–0.2)

## 2021-10-01 LAB — RETICULOCYTES
Immature Retic Fract: 22.1 % — ABNORMAL HIGH (ref 2.3–15.9)
RBC.: 2.51 MIL/uL — ABNORMAL LOW (ref 4.22–5.81)
Retic Count, Absolute: 52.2 10*3/uL (ref 19.0–186.0)
Retic Ct Pct: 2.1 % (ref 0.4–3.1)

## 2021-10-01 LAB — IRON AND TIBC
Iron: 108 ug/dL (ref 45–182)
Saturation Ratios: 37 % (ref 17.9–39.5)
TIBC: 288 ug/dL (ref 250–450)
UIBC: 180 ug/dL

## 2021-10-01 LAB — FERRITIN: Ferritin: 218 ng/mL (ref 24–336)

## 2021-10-01 LAB — FOLATE: Folate: 7.1 ng/mL (ref >5.9)

## 2021-10-01 MED ORDER — LUSPATERCEPT-AAMT 75 MG ~~LOC~~ SOLR
100.0000 mg | SUBCUTANEOUS | Status: DC
  Administered 2021-10-01: 100 mg via SUBCUTANEOUS
  Filled 2021-10-01: qty 1.5

## 2021-10-01 NOTE — Progress Notes (Signed)
Hematology/Oncology Consult note [facility]  Telephone:([phone] [phone]  Patient Care Team: Alex Ramsay, MD as PCP - General (Infectious Diseases) Alex Asal, MD (Inactive) as Referring Physician (Hematology and Oncology) Alex Sickles, MD as Referring Physician (Hematology and Oncology) Alex Sickles, MD as Referring Physician (Hematology and Oncology)   Name of the patient: Alex Smith  [id number]  Jan 28, 1935   Date of visit: 10/01/21  Diagnosis-  History of hairy cell leukemia 2.  Smoldering multiple myeloma under observation 3.  MDS on periodic transfusion now on luspatercept    Chief complaint/ Reason for visit-routine follow-up of pancytopenia likely secondary to MDS  Heme/Onc history: Patient is a 87 year old male who sees Alex Smith and is transitioning his care to me.  He has following issues:   1.  Hairy cell leukemia that was initially diagnosed on bone marrow biopsy in June 2016.  Bone marrow biopsy at that time showed extensive marrow involvement with recurrent hairy cell leukemia approximately 80% of the cells in the core 10% monoclonal plasma cell infiltrate compatible with plasma cell neoplasm.  Peripheral smear revealed leukopenia with a white count of 1900 compatible with hairy cell leukemia. 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  FISH studies for MDS were negative.  Cytogenetics were normal (46, XY).  He has last received cladribine for this back in July 2016   2.  Smoldering multiple myeloma: Bone marrow biopsy in March 2017 revealed persistent plasma cell neoplasm with monoclonal plasma cells estimated at 20 to 30%.  No morphologic evidence of residual hairy cell leukemia.Marrow was normocellular for age with relative erythroid hyperplasia, relative myeloid hypoplasia, mild dyspoiesis, adequate megakaryocytes and no increased  blasts.  There was no significant increase in marrow reticulin fibers.  Iron was present.  FISH studies for MDS were negative.  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  Cytogenetics were normal (46, XY).  Last bone marrow biopsy was in October 2018 which showed variably cellular marrow 10 to 50% with kappa restricted plasmacytosis of 10 to 15%.  No evidence of leukemia lymphoma or high-grade dysplasia.  Normal cytogenetics.  He has also been followed by Alex Smith at [facility].  He has received Revlimid and Decadron in the past in 2017 which was subsequently stopped.  PET scan in July 2017 showed no hypermetabolic adenopathy or focal skeletal lesions.   3.  Probable MDS: He has received a trial of Procrit in the past in 2017 but apparently did not respond to it.  He has been getting periodic blood transfusions to maintain his hemoglobin closer to 9 as he is symptomatic when his hemoglobin is less than 9.  He has been getting about 2-3 blood transfusions during the year.  He does have some mild leukopenia/neutropenia and is on acyclovir prophylaxis.  He gets weekly G-CSF when his ANC is less than 1.5.   4.  Also has history of B12 deficiency for which he is on oral B12.      Interval history-patient has been in and out of the hospital multiple times in the last few months due to recurrent falls.  Currently he resides in assisted living facility.  Presently reports doing well other than mild fatigue denies other complaints.  ECOG PS- 2 Pain scale- 0   Review of systems- Review of Systems  Constitutional:  Positive for malaise/fatigue. Negative for chills,  fever and weight loss.  HENT:  Negative for congestion, ear discharge and nosebleeds.   Eyes:  Negative for blurred vision.  Respiratory:  Negative for cough, hemoptysis, sputum production, shortness of breath and wheezing.   Cardiovascular:  Negative for chest pain, palpitations, orthopnea and  claudication.  Gastrointestinal:  Negative for abdominal pain, blood in stool, constipation, diarrhea, heartburn, melena, nausea and vomiting.  Genitourinary:  Negative for dysuria, flank pain, frequency, hematuria and urgency.  Musculoskeletal:  Negative for back pain, joint pain and myalgias.  Skin:  Negative for rash.  Neurological:  Negative for dizziness, tingling, focal weakness, seizures, weakness and headaches.  Endo/Heme/Allergies:  Does not bruise/bleed easily.  Psychiatric/Behavioral:  Negative for depression and suicidal ideas. The patient does not have insomnia.      Allergies  Allergen Reactions   Rituximab Rash    Chest tightness Chest tightness   Blood-Group Specific Substance Other (See Comments)    Had a post transfusion reaction of red blood cells; NOW REQUIRES WASHED BLOOD CELLS Had a post transfusion reaction of red blood cells; NOW REQUIRES WASHED BLOOD CELLS   Primaxin [Imipenem] Other (See Comments)    Possible allergy Tolerates cephalosporins   Voriconazole Other (See Comments)   Sulfa Antibiotics Itching and Rash   Sulfacetamide Sodium Itching and Rash     Past Medical History:  Diagnosis Date   Alzheimer's dementia ([place]) 08/18/2021   Anemia    B12 deficiency    Basal cell carcinoma 03/30/2018   left ant nasal ala   Basal cell carcinoma of face 01/19/2013   right distal dorsum nose   BPH (benign prostatic hypertrophy)    followed by urology, discharged (Dr. 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 pretibial. 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 EDC 09/07/2020   Squamous cell carcinoma of skin 04/03/2020   Left cheek. WD SCC   Systolic murmur 16/05/9603   Type 2 diabetes, controlled, with retinopathy ([place])      Past Surgical History:  Procedure Laterality Date   25 GAUGE PARS PLANA VITRECTOMY WITH 20 GAUGE MVR PORT FOR MACULAR HOLE  07/07/2012   Procedure: 25 GAUGE PARS PLANA VITRECTOMY WITH 20 GAUGE MVR PORT FOR MACULAR HOLE;  Surgeon: Hayden Pedro, MD;  Location: [facility];  Service: Ophthalmology;  Laterality: Left;   BONE MARROW BIOPSY  2016   CARDIOVASCULAR STRESS TEST  2013   treadmill - no evidence ischemia, EF 61%   CATARACT EXTRACTION W/ INTRAOCULAR LENS  IMPLANT, BILATERAL  ~ 2010   COLONOSCOPY  2014   [place] WNL no rpt needed, h/o polyps   EYE SURGERY Left 06/2012   laser surgery   GAS INSERTION  07/07/2012   Procedure: INSERTION OF GAS;  Surgeon: Hayden Pedro, MD;  Location: [facility];  Service: Ophthalmology;  Laterality: Left;  C3F8  PERIPHERAL VASCULAR CATHETERIZATION N/A 02/23/2015   Procedure: Glori Luis Cath Insertion;  Surgeon: Algernon Huxley, MD;  Location: [facility];  Service: Cardiovascular;  Laterality: N/A;   PORTA CATH REMOVAL N/A 02/16/2020   Procedure: PORTA CATH REMOVAL;  Surgeon: Algernon Huxley, MD;  Location: [facility];  Service: Cardiovascular;  Laterality: N/A;   SERUM PATCH  07/07/2012   Procedure: SERUM PATCH;  Surgeon: Hayden Pedro, MD;  Location: [facility];  Service: Ophthalmology;  Laterality: Left;   SKIN CANCER EXCISION     "all over my face" (07/07/2012)    Social History   Socioeconomic History   Marital status: Married    Spouse name: Not on file   Number of children: Not on file   Years of education: Not on file   Highest education level: Not on file  Occupational History   Not on file  Tobacco Use    Smoking status: Former    Packs/day: 2.00    Years: 25.00    Pack years: 50.00    Types: Cigarettes    Quit date: 02/06/1969    Years since quitting: 52.6   Smokeless tobacco: Never   Tobacco comments:    07/07/2012 "stopped smoking ~ 40 yr ago; smoked 20-36yr  Vaping Use   Vaping Use: Never used  Substance and Sexual Activity   Alcohol use: Not Currently   Drug use: No   Sexual activity: Never  Other Topics Concern   Not on file  Social History Narrative   Lives at home alone. 1 cat   Occupation: was cArt gallery manager works part time for [employer]   Activity: likes to golf   Diet: moderate water, fruits/vegetables daily   Social Determinants of HRadio broadcast assistantStrain: Not on file  Food Insecurity: Not on file  Transportation Needs: Not on file  Physical Activity: Not on file  Stress: Not on file  Social Connections: Not on file  Intimate Partner Violence: Not on file    Family History  Problem Relation Age of Onset   Dementia Mother    Heart failure Father 73  Cancer Sister        breast   Diabetes Paternal Uncle    Diabetes Paternal Aunt    CAD Brother 442      MI   Stroke Neg Hx      Current Outpatient Medications:    amLODipine (NORVASC) 5 MG tablet, Take 1 tablet (5 mg total) by mouth daily., Disp: 30 tablet, Rfl: 2   aspirin EC 81 MG tablet, Take 81 mg by mouth daily., Disp: , Rfl:    doxazosin (CARDURA) 1 MG tablet, TAKE ONE TABLET BY MOUTH EVERY DAY, Disp: 30 tablet, Rfl: 5   famotidine (PEPCID) 20 MG tablet, Take 20 mg by mouth daily., Disp: , Rfl:    feeding supplement (ENSURE ENLIVE / ENSURE PLUS) LIQD, Take 237 mLs by mouth 2 (two) times daily between meals., Disp: 237 mL, Rfl: 12   insulin aspart (NOVOLOG) 100 UNIT/ML injection, Inject into the skin 3 (three) times daily before meals. As needed on sliding scale121-150 gets 1 unit, 151 - 200  gets 2 units , 251-300-3 units, 301  to 350  gets 7 units , 9 units, over 400 call md, Disp: ,  Rfl:    insulin glargine (LANTUS) 100 UNIT/ML Solostar Pen, Inject 13 Units into the skin at bedtime., Disp: , Rfl:    lisinopril (ZESTRIL) 20 MG  tablet, TAKE ONE TABLET BY MOUTH TWICE DAILY, Disp: 180 tablet, Rfl: 0   Multiple Vitamin (MULTIVITAMIN WITH MINERALS) TABS tablet, Take 1 tablet by mouth daily., Disp: , Rfl:    NON FORMULARY, Apply 1 application topically as needed. AMITIP5% / GABA10% / LIDO5% Apply topically to arms, abdomen and legs as needed. *Provided by patient's family*, Disp: , Rfl:    pravastatin (PRAVACHOL) 20 MG tablet, TAKE ONE TABLET EVERY EVENING, Disp: 30 tablet, Rfl: 0   sitaGLIPtin (JANUVIA) 25 MG tablet, Take 50 mg by mouth daily., Disp: , Rfl:    tamsulosin (FLOMAX) 0.4 MG CAPS capsule, TAKE 1 CAPSULE BY MOUTH EVERY DAY, Disp: 30 capsule, Rfl: 3   traMADol (ULTRAM) 50 MG tablet, Take 1 tablet (50 mg total) by mouth every 6 (six) hours as needed for up to 4 doses for moderate pain., Disp: 4 tablet, Rfl: 0   vitamin B-12 (CYANOCOBALAMIN) 1000 MCG tablet, Take 1 tablet (1,000 mcg total) by mouth daily., Disp: 90 tablet, Rfl: 3   vitamin E 400 UNIT capsule, Take 400 Units by mouth daily., Disp: , Rfl:  No current facility-administered medications for this visit.  Facility-Administered Medications Ordered in Other Visits:    heparin lock flush 100 unit/mL, 500 Units, Intravenous, Once, Corcoran, Melissa C, MD   luspatercept-aamt (REBLOZYL) subcutaneous injection 100 mg, 100 mg, Subcutaneous, Q21 days, Sindy Guadeloupe, MD, 100 mg at 10/01/21 1043   sodium chloride 0.9 % injection 10 mL, 10 mL, Intravenous, PRN, Alex Smith, Melissa C, MD, 10 mL at 03/03/15 0903   sodium chloride 0.9 % injection 10 mL, 10 mL, Intracatheter, PRN, Alex Smith, Melissa C, MD, 10 mL at 03/10/15 1410   sodium chloride flush (NS) 0.9 % injection 10 mL, 10 mL, Intravenous, PRN, Alex Smith, Melissa C, MD, 10 mL at 07/23/18 1517  Physical exam:  Vitals:   10/01/21 0932  BP: (!) 159/68  Pulse: 75  Resp:  16  Temp: 97.8 F (36.6 C)  TempSrc: Tympanic  Weight: 190 lb 14.4 oz (86.6 kg)  Height: 5' 7"  (1.702 m)   Physical Exam Cardiovascular:     Rate and Rhythm: Normal rate and regular rhythm.     Heart sounds: Normal heart sounds.  Pulmonary:     Effort: Pulmonary effort is normal.     Breath sounds: Normal breath sounds.  Abdominal:     General: Bowel sounds are normal.     Palpations: Abdomen is soft.  Skin:    General: Skin is warm and dry.  Neurological:     Mental Status: He is alert and oriented to person, place, and time.     CMP Latest Ref Rng & Units 08/20/2021  Glucose 70 - 99 mg/dL 178(H)  BUN 8 - 23 mg/dL 17  Creatinine 0.61 - 1.24 mg/dL 1.09  Sodium 135 - 145 mmol/L 140  Potassium 3.5 - 5.1 mmol/L 3.7  Chloride 98 - 111 mmol/L 108  CO2 22 - 32 mmol/L 27  Calcium 8.9 - 10.3 mg/dL 8.1(L)  Total Protein 6.5 - 8.1 g/dL -  Total Bilirubin 0.3 - 1.2 mg/dL -  Alkaline Phos 38 - 126 U/L -  AST 15 - 41 U/L -  ALT 0 - 44 U/L -   CBC Latest Ref Rng & Units 10/01/2021  WBC 4.0 - 10.5 K/uL 2.8(L)  Hemoglobin 13.0 - 17.0 g/dL 8.4(L)  Hematocrit 39.0 - 52.0 % 25.7(L)  Platelets 150 - 400 K/uL 89(L)     Assessment and plan- Patient is a  87 y.o. male who is here for follow-up of following issues  1.  Low-grade MDS: I suspect his worsening pancytopenia is likely due to MDS.  He has received Luspatercept last in December 2022 but then again missed his November 2022 dosing as well.  We will restart Luspatercept at this time which she will receive every 3 weeks.  His baseline white count which was normal about 6 months ago has drifted down to 2.8, platelets drifted down to 80s from 100s and hemoglobin drifted down from tens to eights.  If pancytopenia fails to improve despite Luspatercept I will consider a bone marrow biopsy at that time.  2.  Smoldering multiple myeloma: Serum free light chains and multiple myeloma panel from today are pending  3.  Normocytic anemia: Likely  secondary to MDS.  Ferritin and iron studies folate and TSH are all normal.  Luspatercept in 3 weeks in 6 weeks and I will see him back in 6 weeks with CBC with differential and B12 levels   Visit Diagnosis 1. High risk medication use   2. Myelodysplastic syndrome, low grade (HCC)      Dr. Randa Evens, MD, MPH [facility] [phone] 10/01/2021 3:27 PM

## 2021-10-02 LAB — KAPPA/LAMBDA LIGHT CHAINS
Kappa free light chain: 61.3 mg/L — ABNORMAL HIGH (ref 3.3–19.4)
Kappa, lambda light chain ratio: 5.47 — ABNORMAL HIGH (ref 0.26–1.65)
Lambda free light chains: 11.2 mg/L (ref 5.7–26.3)

## 2021-10-05 LAB — MULTIPLE MYELOMA PANEL, SERUM
Albumin SerPl Elph-Mcnc: 3.3 g/dL (ref 2.9–4.4)
Albumin/Glob SerPl: 1.1 (ref 0.7–1.7)
Alpha 1: 0.2 g/dL (ref 0.0–0.4)
Alpha2 Glob SerPl Elph-Mcnc: 0.7 g/dL (ref 0.4–1.0)
B-Globulin SerPl Elph-Mcnc: 0.7 g/dL (ref 0.7–1.3)
Gamma Glob SerPl Elph-Mcnc: 1.5 g/dL (ref 0.4–1.8)
Globulin, Total: 3.1 g/dL (ref 2.2–3.9)
IgA: 20 mg/dL — ABNORMAL LOW (ref 61–437)
IgG (Immunoglobin G), Serum: 1697 mg/dL — ABNORMAL HIGH (ref 603–1613)
IgM (Immunoglobulin M), Srm: 21 mg/dL (ref 15–143)
M Protein SerPl Elph-Mcnc: 1.2 g/dL — ABNORMAL HIGH
Total Protein ELP: 6.4 g/dL (ref 6.0–8.5)

## 2021-10-08 ENCOUNTER — Encounter: Payer: Self-pay | Admitting: Hematology and Oncology

## 2021-10-22 ENCOUNTER — Other Ambulatory Visit: Payer: Self-pay

## 2021-10-22 ENCOUNTER — Inpatient Hospital Stay: Payer: Medicare Other

## 2021-10-22 ENCOUNTER — Inpatient Hospital Stay: Payer: Medicare Other | Attending: Nurse Practitioner

## 2021-10-22 ENCOUNTER — Encounter: Payer: Self-pay | Admitting: Hematology and Oncology

## 2021-10-22 DIAGNOSIS — Z79899 Other long term (current) drug therapy: Secondary | ICD-10-CM | POA: Insufficient documentation

## 2021-10-22 DIAGNOSIS — D46Z Other myelodysplastic syndromes: Secondary | ICD-10-CM | POA: Diagnosis present

## 2021-10-22 DIAGNOSIS — D4620 Refractory anemia with excess of blasts, unspecified: Secondary | ICD-10-CM

## 2021-10-22 DIAGNOSIS — D472 Monoclonal gammopathy: Secondary | ICD-10-CM

## 2021-10-22 LAB — COMPREHENSIVE METABOLIC PANEL
ALT: 8 U/L (ref 0–44)
AST: 13 U/L — ABNORMAL LOW (ref 15–41)
Albumin: 3.9 g/dL (ref 3.5–5.0)
Alkaline Phosphatase: 71 U/L (ref 38–126)
Anion gap: 5 (ref 5–15)
BUN: 26 mg/dL — ABNORMAL HIGH (ref 8–23)
CO2: 25 mmol/L (ref 22–32)
Calcium: 8.5 mg/dL — ABNORMAL LOW (ref 8.9–10.3)
Chloride: 103 mmol/L (ref 98–111)
Creatinine, Ser: 1.04 mg/dL (ref 0.61–1.24)
GFR, Estimated: >60 mL/min (ref >60)
Glucose, Bld: 138 mg/dL — ABNORMAL HIGH (ref 70–99)
Potassium: 4 mmol/L (ref 3.5–5.1)
Sodium: 133 mmol/L — ABNORMAL LOW (ref 135–145)
Total Bilirubin: 0.5 mg/dL (ref 0.3–1.2)
Total Protein: 7.2 g/dL (ref 6.5–8.1)

## 2021-10-22 LAB — CBC WITH DIFFERENTIAL/PLATELET
Abs Immature Granulocytes: 0 10*3/uL (ref 0.00–0.07)
Basophils Absolute: 0 10*3/uL (ref 0.0–0.1)
Basophils Relative: 1 %
Eosinophils Absolute: 0.1 10*3/uL (ref 0.0–0.5)
Eosinophils Relative: 2 %
HCT: 28.3 % — ABNORMAL LOW (ref 39.0–52.0)
Hemoglobin: 9.1 g/dL — ABNORMAL LOW (ref 13.0–17.0)
Immature Granulocytes: 0 %
Lymphocytes Relative: 42 %
Lymphs Abs: 1.3 10*3/uL (ref 0.7–4.0)
MCH: 34.1 pg — ABNORMAL HIGH (ref 26.0–34.0)
MCHC: 32.2 g/dL (ref 30.0–36.0)
MCV: 106 fL — ABNORMAL HIGH (ref 80.0–100.0)
Monocytes Absolute: 0.2 10*3/uL (ref 0.1–1.0)
Monocytes Relative: 7 %
Neutro Abs: 1.5 10*3/uL — ABNORMAL LOW (ref 1.7–7.7)
Neutrophils Relative %: 48 %
Platelets: 105 10*3/uL — ABNORMAL LOW (ref 150–400)
RBC: 2.67 MIL/uL — ABNORMAL LOW (ref 4.22–5.81)
RDW: 16.6 % — ABNORMAL HIGH (ref 11.5–15.5)
WBC: 3.1 10*3/uL — ABNORMAL LOW (ref 4.0–10.5)
nRBC: 0.6 % — ABNORMAL HIGH (ref 0.0–0.2)

## 2021-10-22 LAB — VITAMIN B12: Vitamin B-12: 489 pg/mL (ref 180–914)

## 2021-10-22 MED ORDER — LUSPATERCEPT-AAMT 75 MG ~~LOC~~ SOLR
100.0000 mg | SUBCUTANEOUS | Status: DC
  Administered 2021-10-22: 100 mg via SUBCUTANEOUS
  Filled 2021-10-22: qty 0.5

## 2021-10-23 LAB — KAPPA/LAMBDA LIGHT CHAINS
Kappa free light chain: 69.2 mg/L — ABNORMAL HIGH (ref 3.3–19.4)
Kappa, lambda light chain ratio: 5.58 — ABNORMAL HIGH (ref 0.26–1.65)
Lambda free light chains: 12.4 mg/L (ref 5.7–26.3)

## 2021-10-25 LAB — MULTIPLE MYELOMA PANEL, SERUM
Albumin SerPl Elph-Mcnc: 3.5 g/dL (ref 2.9–4.4)
Albumin/Glob SerPl: 1.1 (ref 0.7–1.7)
Alpha 1: 0.2 g/dL (ref 0.0–0.4)
Alpha2 Glob SerPl Elph-Mcnc: 0.7 g/dL (ref 0.4–1.0)
B-Globulin SerPl Elph-Mcnc: 0.8 g/dL (ref 0.7–1.3)
Gamma Glob SerPl Elph-Mcnc: 1.6 g/dL (ref 0.4–1.8)
Globulin, Total: 3.4 g/dL (ref 2.2–3.9)
IgA: 18 mg/dL — ABNORMAL LOW (ref 61–437)
IgG (Immunoglobin G), Serum: 1706 mg/dL — ABNORMAL HIGH (ref 603–1613)
IgM (Immunoglobulin M), Srm: 22 mg/dL (ref 15–143)
M Protein SerPl Elph-Mcnc: 1.3 g/dL — ABNORMAL HIGH
Total Protein ELP: 6.9 g/dL (ref 6.0–8.5)

## 2021-11-05 ENCOUNTER — Other Ambulatory Visit: Payer: Self-pay | Admitting: *Deleted

## 2021-11-05 DIAGNOSIS — D649 Anemia, unspecified: Secondary | ICD-10-CM

## 2021-11-05 DIAGNOSIS — D46Z Other myelodysplastic syndromes: Secondary | ICD-10-CM

## 2021-11-05 DIAGNOSIS — D4620 Refractory anemia with excess of blasts, unspecified: Secondary | ICD-10-CM

## 2021-11-05 DIAGNOSIS — E538 Deficiency of other specified B group vitamins: Secondary | ICD-10-CM

## 2021-11-06 ENCOUNTER — Encounter: Payer: Self-pay | Admitting: Hematology and Oncology

## 2021-11-12 ENCOUNTER — Other Ambulatory Visit: Payer: Self-pay

## 2021-11-12 ENCOUNTER — Ambulatory Visit: Payer: Medicare Other | Admitting: Oncology

## 2021-11-12 ENCOUNTER — Inpatient Hospital Stay: Payer: Medicare Other

## 2021-11-12 ENCOUNTER — Inpatient Hospital Stay (HOSPITAL_BASED_OUTPATIENT_CLINIC_OR_DEPARTMENT_OTHER): Payer: Medicare Other | Admitting: Oncology

## 2021-11-12 ENCOUNTER — Ambulatory Visit: Payer: Medicare Other

## 2021-11-12 ENCOUNTER — Encounter: Payer: Self-pay | Admitting: Oncology

## 2021-11-12 ENCOUNTER — Other Ambulatory Visit: Payer: Medicare Other

## 2021-11-12 VITALS — BP 140/66 | HR 90 | Temp 99.9°F | Resp 18 | Ht 64.25 in | Wt 193.2 lb

## 2021-11-12 DIAGNOSIS — Z79899 Other long term (current) drug therapy: Secondary | ICD-10-CM

## 2021-11-12 DIAGNOSIS — D4620 Refractory anemia with excess of blasts, unspecified: Secondary | ICD-10-CM | POA: Diagnosis not present

## 2021-11-12 DIAGNOSIS — D472 Monoclonal gammopathy: Secondary | ICD-10-CM | POA: Diagnosis not present

## 2021-11-12 DIAGNOSIS — D649 Anemia, unspecified: Secondary | ICD-10-CM

## 2021-11-12 DIAGNOSIS — D46Z Other myelodysplastic syndromes: Secondary | ICD-10-CM | POA: Diagnosis not present

## 2021-11-12 DIAGNOSIS — E7800 Pure hypercholesterolemia, unspecified: Secondary | ICD-10-CM | POA: Insufficient documentation

## 2021-11-12 DIAGNOSIS — C9141 Hairy cell leukemia, in remission: Secondary | ICD-10-CM | POA: Diagnosis not present

## 2021-11-12 DIAGNOSIS — E538 Deficiency of other specified B group vitamins: Secondary | ICD-10-CM

## 2021-11-12 DIAGNOSIS — E1165 Type 2 diabetes mellitus with hyperglycemia: Secondary | ICD-10-CM | POA: Insufficient documentation

## 2021-11-12 LAB — CBC WITH DIFFERENTIAL/PLATELET
Abs Immature Granulocytes: 0.01 10*3/uL (ref 0.00–0.07)
Basophils Absolute: 0 10*3/uL (ref 0.0–0.1)
Basophils Relative: 1 %
Eosinophils Absolute: 0.1 10*3/uL (ref 0.0–0.5)
Eosinophils Relative: 3 %
HCT: 31.3 % — ABNORMAL LOW (ref 39.0–52.0)
Hemoglobin: 10.1 g/dL — ABNORMAL LOW (ref 13.0–17.0)
Immature Granulocytes: 0 %
Lymphocytes Relative: 43 %
Lymphs Abs: 1.8 10*3/uL (ref 0.7–4.0)
MCH: 34 pg (ref 26.0–34.0)
MCHC: 32.3 g/dL (ref 30.0–36.0)
MCV: 105.4 fL — ABNORMAL HIGH (ref 80.0–100.0)
Monocytes Absolute: 0.2 10*3/uL (ref 0.1–1.0)
Monocytes Relative: 5 %
Neutro Abs: 2 10*3/uL (ref 1.7–7.7)
Neutrophils Relative %: 48 %
Platelets: 115 10*3/uL — ABNORMAL LOW (ref 150–400)
RBC: 2.97 MIL/uL — ABNORMAL LOW (ref 4.22–5.81)
RDW: 15.9 % — ABNORMAL HIGH (ref 11.5–15.5)
WBC: 4.1 10*3/uL (ref 4.0–10.5)
nRBC: 0.5 % — ABNORMAL HIGH (ref 0.0–0.2)

## 2021-11-12 LAB — VITAMIN B12: Vitamin B-12: 637 pg/mL (ref 180–914)

## 2021-11-12 MED ORDER — LUSPATERCEPT-AAMT 75 MG ~~LOC~~ SOLR
100.0000 mg | SUBCUTANEOUS | Status: DC
  Administered 2021-11-12: 100 mg via SUBCUTANEOUS
  Filled 2021-11-12 (×2): qty 2

## 2021-11-12 NOTE — Progress Notes (Signed)
Patient tolerated the Luspatercept injection today, no concerns voiced. Stable at discharge. AVS given.   ?

## 2021-11-13 ENCOUNTER — Inpatient Hospital Stay: Payer: Medicare Other

## 2021-11-14 ENCOUNTER — Encounter: Payer: Self-pay | Admitting: Hematology and Oncology

## 2021-11-16 ENCOUNTER — Encounter (INDEPENDENT_AMBULATORY_CARE_PROVIDER_SITE_OTHER): Payer: Medicare Other | Admitting: Ophthalmology

## 2021-11-16 DIAGNOSIS — H353132 Nonexudative age-related macular degeneration, bilateral, intermediate dry stage: Secondary | ICD-10-CM

## 2021-11-16 DIAGNOSIS — H35033 Hypertensive retinopathy, bilateral: Secondary | ICD-10-CM | POA: Diagnosis not present

## 2021-11-16 DIAGNOSIS — H35342 Macular cyst, hole, or pseudohole, left eye: Secondary | ICD-10-CM | POA: Diagnosis not present

## 2021-11-16 DIAGNOSIS — I10 Essential (primary) hypertension: Secondary | ICD-10-CM | POA: Diagnosis not present

## 2021-11-16 DIAGNOSIS — H43811 Vitreous degeneration, right eye: Secondary | ICD-10-CM

## 2021-11-18 ENCOUNTER — Encounter: Payer: Self-pay | Admitting: Hematology and Oncology

## 2021-11-18 NOTE — Progress Notes (Signed)
? ? ? ?Hematology/Oncology Consult note ?[facility]  ?[phone] [phone] ? ?Patient Care Team: ?Leonel Ramsay, MD as PCP - General (Infectious Diseases) ?Lequita Asal, MD (Inactive) as Referring Physician (Hematology and Oncology) ?Crissie Sickles, MD as Referring Physician (Hematology and Oncology) ?Crissie Sickles, MD as Referring Physician (Hematology and Oncology)  ? ?Name of the patient: Alex Smith  ?[id number]  ?1935/01/18  ? ?Date of visit: 11/18/21 ? ?Diagnosis- History of hairy cell leukemia ?2.  Smoldering multiple myeloma under observation ?3.  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  ? ?Chief complaint/ Reason for visit-routine follow-up of anemia ? ?Heme/Onc history: Patient is a 87 year old male who sees Dr. [name] and is transitioning his care to me.  He has following issues: ?  ?1.  Hairy cell leukemia that was initially diagnosed on bone marrow biopsy in June 2016.  Bone marrow biopsy at that time showed extensive marrow involvement with recurrent hairy cell leukemia approximately 80% of the cells in the core 10% monoclonal plasma cell infiltrate compatible with plasma cell neoplasm.  Peripheral smear revealed leukopenia with a white count of 1900 compatible with hairy cell leukemia. 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  FISH studies for MDS were negative.  Cytogenetics were normal (46, XY).  He has last received cladribine for this back in July 2016 ?  ?2.  Smoldering multiple myeloma: Bone marrow biopsy in March 2017 revealed persistent plasma cell neoplasm with monoclonal plasma cells estimated at 20 to 30%.  No morphologic evidence of residual hairy cell leukemia.Marrow was normocellular for age with relative erythroid hyperplasia, relative myeloid hypoplasia, mild dyspoiesis, adequate megakaryocytes and no increased blasts.  There was no significant  increase in marrow reticulin fibers.  Iron was present.  FISH studies for MDS were negative.  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  Cytogenetics were normal (46, XY).  Last bone marrow biopsy was in October 2018 which showed variably cellular marrow 10 to 50% with kappa restricted plasmacytosis of 10 to 15%.  No evidence of leukemia lymphoma or high-grade dysplasia.  Normal cytogenetics.  He has also been followed by Dr. [name] at [facility].  He has received Revlimid and Decadron in the past in 2017 which was subsequently stopped.  PET scan in July 2017 showed no hypermetabolic adenopathy or focal skeletal lesions. ?  ?3.  Probable MDS: He has received a trial of Procrit in the past in 2017 but apparently did not respond to it.  He has been getting periodic blood transfusions to maintain his hemoglobin closer to 9 as he is symptomatic when his hemoglobin is less than 9.  He has been getting about 2-3 blood transfusions during the year.  He does have some mild leukopenia/neutropenia and is on acyclovir prophylaxis.  He gets weekly G-CSF when his ANC is less than 1.5. ?  ?4.  Also has history of B12 deficiency for which he is on oral B12. ?  ?  ? ?Interval history-patient is doing better overall.  He has recovered well after his hospitalization in January 2023.  He is back to living at assisted living and denies any significant complaints other than fatigue ? ?ECOG PS- 2 ?Pain scale- 0 ? ? ?Review of systems- Review of Systems  ?Constitutional:  Positive for malaise/fatigue. Negative for chills, fever and weight loss.  ?HENT:  Negative for congestion, ear discharge  and nosebleeds.   ?Eyes:  Negative for blurred vision.  ?Respiratory:  Negative for cough, hemoptysis, sputum production, shortness of breath and wheezing.   ?Cardiovascular:  Negative for chest pain, palpitations, orthopnea and claudication.  ?Gastrointestinal:  Negative for abdominal pain, blood in  stool, constipation, diarrhea, heartburn, melena, nausea and vomiting.  ?Genitourinary:  Negative for dysuria, flank pain, frequency, hematuria and urgency.  ?Musculoskeletal:  Negative for back pain, joint pain and myalgias.  ?Skin:  Negative for rash.  ?Neurological:  Negative for dizziness, tingling, focal weakness, seizures, weakness and headaches.  ?Endo/Heme/Allergies:  Does not bruise/bleed easily.  ?Psychiatric/Behavioral:  Negative for depression and suicidal ideas. The patient does not have insomnia.    ? ? ?Allergies  ?Allergen Reactions  ? Rituximab Rash  ?  Chest tightness ?Chest tightness  ? Blood-Group Specific Substance Other (See Comments)  ?  Had a post transfusion reaction of red blood cells; NOW REQUIRES WASHED BLOOD CELLS ?Had a post transfusion reaction of red blood cells; NOW REQUIRES WASHED BLOOD CELLS  ? Primaxin [Imipenem] Other (See Comments)  ?  Possible allergy ?Tolerates cephalosporins  ? Voriconazole Other (See Comments)  ? Sulfa Antibiotics Itching and Rash  ? Sulfacetamide Sodium Itching and Rash  ? ? ? ?Past Medical History:  ?Diagnosis Date  ? Alzheimer's dementia ([place]) 08/18/2021  ? Anemia   ? B12 deficiency   ? Basal cell carcinoma 03/30/2018  ? left ant nasal ala  ? Basal cell carcinoma of face 01/19/2013  ? right distal dorsum nose  ? BPH (benign prostatic hypertrophy)   ? followed by urology, discharged (Dr. Bernardo Heater)  ? CAP (community acquired pneumonia) 02/15/2015  ? CKD stage 3 due to type 2 diabetes mellitus ([place]) 11/14/2017  ? Colon polyps   ? Diverticulosis   ? Dysplastic nevus 10/27/2006  ? right med calf  ? GERD (gastroesophageal reflux disease)   ? Hairy cell leukemia ([place]) 2006  ? recurrent, seizure on rituxan, now on cladribine [name])  ? History of pneumonia 2000's  ? "once" (07/07/2012)  ? History of shingles   ? HLD (hyperlipidemia)   ? Hypertension   ? Pneumonia   ? Shortness of breath dyspnea   ? Squamous cell carcinoma in situ 07/05/2020  ? Left lat.  pretibial. EDC 09/07/2020  ? Squamous cell carcinoma in situ 07/05/2020  ? Right medial pretibial. EDC 09/07/2020  ? Squamous cell carcinoma of skin 04/03/2020  ? Left cheek. WD SCC  ? Systolic murmur 64/33/2951  ? Type 2 diabetes, controlled, with retinopathy ([place])   ? ? ? ?Past Surgical History:  ?Procedure Laterality Date  ? 25 GAUGE PARS PLANA VITRECTOMY WITH 20 GAUGE MVR PORT FOR MACULAR HOLE  07/07/2012  ? Procedure: 36 GAUGE PARS PLANA VITRECTOMY WITH 20 GAUGE MVR PORT FOR MACULAR HOLE;  Surgeon: Hayden Pedro, MD;  Location: [facility];  Service: Ophthalmology;  Laterality: Left;  ? BONE MARROW BIOPSY  2016  ? CARDIOVASCULAR STRESS TEST  2013  ? treadmill - no evidence ischemia, EF 61%  ? CATARACT EXTRACTION W/ INTRAOCULAR LENS  IMPLANT, BILATERAL  ~ 2010  ? COLONOSCOPY  2014  ? [place] WNL no rpt needed, h/o polyps  ? EYE SURGERY Left 06/2012  ? laser surgery  ? GAS INSERTION  07/07/2012  ? Procedure: INSERTION OF GAS;  Surgeon: Hayden Pedro, MD;  Location: [facility];  Service: Ophthalmology;  Laterality: Left;  [id number]  ? PERIPHERAL VASCULAR CATHETERIZATION N/A 02/23/2015  ? Procedure: Porta Cath Insertion;  Surgeon: Algernon Huxley, MD;  Location: [facility];  Service: Cardiovascular;  Laterality: N/A;  ? PORTA CATH REMOVAL N/A 02/16/2020  ? Procedure: PORTA CATH REMOVAL;  Surgeon: Algernon Huxley, MD;  Location: [facility];  Service: Cardiovascular;  Laterality: N/A;  ? SERUM PATCH  07/07/2012  ? Procedure: SERUM PATCH;  Surgeon: Hayden Pedro, MD;  Location: [facility];  Service: Ophthalmology;  Laterality: Left;  ? SKIN CANCER EXCISION    ? "all over my face" (07/07/2012)  ? ? ?Social History  ? ?Socioeconomic History  ? Marital status: Married  ?  Spouse name: Not on file  ? Number of children: Not on file  ? Years of education: Not on file  ? Highest education level: Not on file  ?Occupational History  ? Not on file  ?Tobacco Use  ? Smoking status: Former  ?  Packs/day: 2.00  ?  Years: 25.00  ?  Pack  years: 50.00  ?  Types: Cigarettes  ?  Quit date: 02/06/1969  ?  Years since quitting: 52.8  ? Smokeless tobacco: Never  ? Tobacco comments:  ?  07/07/2012 "stopped smoking ~ 40 yr ago; smoked 20-63yr  ?Vaping Use  ? V

## 2021-11-21 ENCOUNTER — Encounter: Payer: Self-pay | Admitting: Hematology and Oncology

## 2021-12-04 ENCOUNTER — Inpatient Hospital Stay: Payer: Medicare Other | Attending: Nurse Practitioner

## 2021-12-04 ENCOUNTER — Ambulatory Visit: Payer: Medicare Other

## 2021-12-04 ENCOUNTER — Inpatient Hospital Stay: Payer: Medicare Other

## 2021-12-04 ENCOUNTER — Other Ambulatory Visit: Payer: Medicare Other

## 2021-12-04 DIAGNOSIS — D46Z Other myelodysplastic syndromes: Secondary | ICD-10-CM | POA: Insufficient documentation

## 2021-12-04 DIAGNOSIS — D472 Monoclonal gammopathy: Secondary | ICD-10-CM

## 2021-12-04 LAB — CBC WITH DIFFERENTIAL/PLATELET
Abs Immature Granulocytes: 0.01 10*3/uL (ref 0.00–0.07)
Basophils Absolute: 0 10*3/uL (ref 0.0–0.1)
Basophils Relative: 1 %
Eosinophils Absolute: 0.1 10*3/uL (ref 0.0–0.5)
Eosinophils Relative: 3 %
HCT: 32.3 % — ABNORMAL LOW (ref 39.0–52.0)
Hemoglobin: 10.6 g/dL — ABNORMAL LOW (ref 13.0–17.0)
Immature Granulocytes: 0 %
Lymphocytes Relative: 42 %
Lymphs Abs: 1.5 10*3/uL (ref 0.7–4.0)
MCH: 34.3 pg — ABNORMAL HIGH (ref 26.0–34.0)
MCHC: 32.8 g/dL (ref 30.0–36.0)
MCV: 104.5 fL — ABNORMAL HIGH (ref 80.0–100.0)
Monocytes Absolute: 0.2 10*3/uL (ref 0.1–1.0)
Monocytes Relative: 5 %
Neutro Abs: 1.7 10*3/uL (ref 1.7–7.7)
Neutrophils Relative %: 49 %
Platelets: 89 10*3/uL — ABNORMAL LOW (ref 150–400)
RBC: 3.09 MIL/uL — ABNORMAL LOW (ref 4.22–5.81)
RDW: 15.8 % — ABNORMAL HIGH (ref 11.5–15.5)
WBC: 3.5 10*3/uL — ABNORMAL LOW (ref 4.0–10.5)
nRBC: 0 % (ref 0.0–0.2)

## 2021-12-04 MED ORDER — LUSPATERCEPT-AAMT 75 MG ~~LOC~~ SOLR
100.0000 mg | SUBCUTANEOUS | Status: DC
  Administered 2021-12-04: 100 mg via SUBCUTANEOUS
  Filled 2021-12-04: qty 1.5
  Filled 2021-12-04 (×3): qty 2

## 2021-12-16 DIAGNOSIS — C9000 Multiple myeloma not having achieved remission: Principal | ICD-10-CM

## 2021-12-16 DIAGNOSIS — D4620 Refractory anemia with excess of blasts, unspecified: Principal | ICD-10-CM

## 2021-12-17 ENCOUNTER — Other Ambulatory Visit: Admit: 2021-12-17 | Discharge: 2021-12-18 | Payer: MEDICARE

## 2021-12-17 ENCOUNTER — Ambulatory Visit: Admit: 2021-12-17 | Discharge: 2021-12-18 | Payer: MEDICARE | Attending: Internal Medicine | Primary: Internal Medicine

## 2021-12-17 DIAGNOSIS — D4620 Refractory anemia with excess of blasts, unspecified: Principal | ICD-10-CM

## 2021-12-17 DIAGNOSIS — C9000 Multiple myeloma not having achieved remission: Principal | ICD-10-CM

## 2021-12-25 ENCOUNTER — Other Ambulatory Visit: Payer: Medicare Other

## 2021-12-25 ENCOUNTER — Inpatient Hospital Stay: Payer: Medicare Other

## 2021-12-25 ENCOUNTER — Inpatient Hospital Stay: Payer: Medicare Other | Attending: Nurse Practitioner

## 2021-12-25 ENCOUNTER — Ambulatory Visit: Payer: Medicare Other

## 2021-12-25 DIAGNOSIS — D472 Monoclonal gammopathy: Secondary | ICD-10-CM

## 2021-12-25 DIAGNOSIS — D46Z Other myelodysplastic syndromes: Secondary | ICD-10-CM

## 2021-12-25 LAB — CBC WITH DIFFERENTIAL/PLATELET
Abs Immature Granulocytes: 0.02 10*3/uL (ref 0.00–0.07)
Basophils Absolute: 0 10*3/uL (ref 0.0–0.1)
Basophils Relative: 0 %
Eosinophils Absolute: 0.1 10*3/uL (ref 0.0–0.5)
Eosinophils Relative: 4 %
HCT: 30.6 % — ABNORMAL LOW (ref 39.0–52.0)
Hemoglobin: 10 g/dL — ABNORMAL LOW (ref 13.0–17.0)
Immature Granulocytes: 1 %
Lymphocytes Relative: 49 %
Lymphs Abs: 1.6 10*3/uL (ref 0.7–4.0)
MCH: 34 pg (ref 26.0–34.0)
MCHC: 32.7 g/dL (ref 30.0–36.0)
MCV: 104.1 fL — ABNORMAL HIGH (ref 80.0–100.0)
Monocytes Absolute: 0.2 10*3/uL (ref 0.1–1.0)
Monocytes Relative: 6 %
Neutro Abs: 1.3 10*3/uL — ABNORMAL LOW (ref 1.7–7.7)
Neutrophils Relative %: 40 %
Platelets: 86 10*3/uL — ABNORMAL LOW (ref 150–400)
RBC: 2.94 MIL/uL — ABNORMAL LOW (ref 4.22–5.81)
RDW: 15.8 % — ABNORMAL HIGH (ref 11.5–15.5)
WBC: 3.3 10*3/uL — ABNORMAL LOW (ref 4.0–10.5)
nRBC: 0 % (ref 0.0–0.2)

## 2021-12-25 MED ORDER — LUSPATERCEPT-AAMT 75 MG ~~LOC~~ SOLR
100.0000 mg | SUBCUTANEOUS | Status: DC
  Administered 2021-12-25: 100 mg via SUBCUTANEOUS
  Filled 2021-12-25: qty 1.5

## 2021-12-27 ENCOUNTER — Telehealth: Payer: Self-pay | Admitting: *Deleted

## 2021-12-27 NOTE — Telephone Encounter (Signed)
Called the patient's phone number and instead of it being the patient and his son.  Wanted to let them know that Dr. Lorenda Peck had sent her message over from [name] about the patient being so stable with the  luspatercept that we should spread it out from 3 weeks to 5 to 6 weeks each time.  Dr. Janese Banks read the notes and she said that it is fine to try 2 times of every 5 weeks.  The appointments were made for June 13 arrive at 2 PM for labs and injection.  Then July 18 arrive at 130 for labs and see Dr. Janese Banks and get injection.  Son is agreeable to this plan.  The other thing is that he and his sister have been calling [employer] to talk about the foundation that he is getting assistance for the injections due to the cost.  Son says that they have already sent him in for collection for one of the injections and he just wants to know was or not enough money in the foundation to pay for it and they need to pay for that injection and do they have any foundations for now to cover future injections.  Then he also told me that they do have a foundation it is called co-pay relief and they were told by that foundation that if there is nothing sent by June 7 meaning submitted charges then they are going to take the co-pay relief away and then they will have no way to pay for it.  I told him that I would send a message to [employer]ulse who is temporarily covering this until they get a new person to take the foundations over.  Son is agreeable to the appointments and if He could tell them what they need to do ?

## 2022-01-01 ENCOUNTER — Other Ambulatory Visit: Payer: Self-pay | Admitting: Oncology

## 2022-01-15 ENCOUNTER — Other Ambulatory Visit: Payer: Medicare Other

## 2022-01-15 ENCOUNTER — Ambulatory Visit: Payer: Medicare Other

## 2022-01-21 DIAGNOSIS — M1831 Unilateral post-traumatic osteoarthritis of first carpometacarpal joint, right hand: Secondary | ICD-10-CM | POA: Insufficient documentation

## 2022-01-21 DIAGNOSIS — M5451 Vertebrogenic low back pain: Secondary | ICD-10-CM | POA: Insufficient documentation

## 2022-01-25 NOTE — Progress Notes (Signed)
..  Patient is receiving Assistance Medication - Supplied Externally. Medication: Reblozyl Manufacture: [employer] Approval Dates: Approved from 01/25/2022 until 08/18/2022. ID: [id number] Reason: EOOP

## 2022-01-29 ENCOUNTER — Inpatient Hospital Stay: Payer: Medicare Other

## 2022-01-29 ENCOUNTER — Inpatient Hospital Stay: Payer: Medicare Other | Attending: Nurse Practitioner

## 2022-01-29 ENCOUNTER — Other Ambulatory Visit: Payer: Self-pay

## 2022-01-29 ENCOUNTER — Other Ambulatory Visit: Payer: Medicare Other

## 2022-01-29 ENCOUNTER — Ambulatory Visit: Payer: Medicare Other

## 2022-01-29 DIAGNOSIS — D46Z Other myelodysplastic syndromes: Secondary | ICD-10-CM

## 2022-01-29 DIAGNOSIS — D472 Monoclonal gammopathy: Secondary | ICD-10-CM

## 2022-01-29 LAB — CBC WITH DIFFERENTIAL/PLATELET
Abs Immature Granulocytes: 0.01 10*3/uL (ref 0.00–0.07)
Basophils Absolute: 0 10*3/uL (ref 0.0–0.1)
Basophils Relative: 1 %
Eosinophils Absolute: 0.1 10*3/uL (ref 0.0–0.5)
Eosinophils Relative: 4 %
HCT: 28.8 % — ABNORMAL LOW (ref 39.0–52.0)
Hemoglobin: 9.6 g/dL — ABNORMAL LOW (ref 13.0–17.0)
Immature Granulocytes: 0 %
Lymphocytes Relative: 37 %
Lymphs Abs: 1.3 10*3/uL (ref 0.7–4.0)
MCH: 34.4 pg — ABNORMAL HIGH (ref 26.0–34.0)
MCHC: 33.3 g/dL (ref 30.0–36.0)
MCV: 103.2 fL — ABNORMAL HIGH (ref 80.0–100.0)
Monocytes Absolute: 0.3 10*3/uL (ref 0.1–1.0)
Monocytes Relative: 8 %
Neutro Abs: 1.7 10*3/uL (ref 1.7–7.7)
Neutrophils Relative %: 50 %
Platelets: 103 10*3/uL — ABNORMAL LOW (ref 150–400)
RBC: 2.79 MIL/uL — ABNORMAL LOW (ref 4.22–5.81)
RDW: 15.7 % — ABNORMAL HIGH (ref 11.5–15.5)
WBC: 3.4 10*3/uL — ABNORMAL LOW (ref 4.0–10.5)
nRBC: 0 % (ref 0.0–0.2)

## 2022-01-29 MED ORDER — LUSPATERCEPT-AAMT 75 MG ~~LOC~~ SOLR
75.0000 mg | SUBCUTANEOUS | Status: DC
  Administered 2022-01-29: 75 mg via SUBCUTANEOUS
  Filled 2022-01-29: qty 1.5

## 2022-01-29 MED ORDER — LUSPATERCEPT-AAMT 25 MG ~~LOC~~ SOLR
25.0000 mg | Freq: Once | SUBCUTANEOUS | Status: AC
  Administered 2022-01-29: 25 mg via SUBCUTANEOUS
  Filled 2022-01-29: qty 0.5

## 2022-02-07 ENCOUNTER — Telehealth: Payer: Self-pay | Admitting: *Deleted

## 2022-02-07 NOTE — Telephone Encounter (Signed)
Pt needs help to get free drug with luspatercept. Filled out form and faxed it to [facility] squibb at [phone] and the fax transmission went through

## 2022-02-08 ENCOUNTER — Other Ambulatory Visit: Payer: Medicare Other

## 2022-02-08 ENCOUNTER — Ambulatory Visit: Payer: Medicare Other | Admitting: Oncology

## 2022-02-08 ENCOUNTER — Ambulatory Visit: Payer: Medicare Other

## 2022-03-04 ENCOUNTER — Other Ambulatory Visit: Payer: Self-pay

## 2022-03-04 DIAGNOSIS — D46Z Other myelodysplastic syndromes: Secondary | ICD-10-CM

## 2022-03-05 ENCOUNTER — Other Ambulatory Visit: Payer: Medicare Other

## 2022-03-05 ENCOUNTER — Telehealth: Payer: Self-pay | Admitting: *Deleted

## 2022-03-05 ENCOUNTER — Inpatient Hospital Stay: Payer: Medicare Other | Attending: Nurse Practitioner

## 2022-03-05 ENCOUNTER — Encounter: Payer: Self-pay | Admitting: Oncology

## 2022-03-05 ENCOUNTER — Inpatient Hospital Stay: Payer: Medicare Other

## 2022-03-05 ENCOUNTER — Ambulatory Visit: Payer: Medicare Other | Admitting: Oncology

## 2022-03-05 ENCOUNTER — Inpatient Hospital Stay (HOSPITAL_BASED_OUTPATIENT_CLINIC_OR_DEPARTMENT_OTHER): Payer: Medicare Other | Admitting: Oncology

## 2022-03-05 ENCOUNTER — Ambulatory Visit: Payer: Medicare Other

## 2022-03-05 VITALS — BP 171/108 | HR 73 | Temp 98.1°F | Resp 16 | Ht 64.25 in | Wt 203.8 lb

## 2022-03-05 DIAGNOSIS — D46Z Other myelodysplastic syndromes: Secondary | ICD-10-CM

## 2022-03-05 DIAGNOSIS — Z79899 Other long term (current) drug therapy: Secondary | ICD-10-CM

## 2022-03-05 DIAGNOSIS — D472 Monoclonal gammopathy: Secondary | ICD-10-CM

## 2022-03-05 DIAGNOSIS — Z5111 Encounter for antineoplastic chemotherapy: Secondary | ICD-10-CM | POA: Diagnosis present

## 2022-03-05 LAB — CBC WITH DIFFERENTIAL/PLATELET
Abs Immature Granulocytes: 0.04 10*3/uL (ref 0.00–0.07)
Basophils Absolute: 0 10*3/uL (ref 0.0–0.1)
Basophils Relative: 1 %
Eosinophils Absolute: 0.1 10*3/uL (ref 0.0–0.5)
Eosinophils Relative: 3 %
HCT: 30.8 % — ABNORMAL LOW (ref 39.0–52.0)
Hemoglobin: 10.1 g/dL — ABNORMAL LOW (ref 13.0–17.0)
Immature Granulocytes: 2 %
Lymphocytes Relative: 36 %
Lymphs Abs: 1 10*3/uL (ref 0.7–4.0)
MCH: 34.6 pg — ABNORMAL HIGH (ref 26.0–34.0)
MCHC: 32.8 g/dL (ref 30.0–36.0)
MCV: 105.5 fL — ABNORMAL HIGH (ref 80.0–100.0)
Monocytes Absolute: 0.2 10*3/uL (ref 0.1–1.0)
Monocytes Relative: 7 %
Neutro Abs: 1.4 10*3/uL — ABNORMAL LOW (ref 1.7–7.7)
Neutrophils Relative %: 51 %
Platelets: 113 10*3/uL — ABNORMAL LOW (ref 150–400)
RBC: 2.92 MIL/uL — ABNORMAL LOW (ref 4.22–5.81)
RDW: 15.6 % — ABNORMAL HIGH (ref 11.5–15.5)
WBC: 2.7 10*3/uL — ABNORMAL LOW (ref 4.0–10.5)
nRBC: 0 % (ref 0.0–0.2)

## 2022-03-05 LAB — COMPREHENSIVE METABOLIC PANEL
ALT: 12 U/L (ref 0–44)
AST: 14 U/L — ABNORMAL LOW (ref 15–41)
Albumin: 3.9 g/dL (ref 3.5–5.0)
Alkaline Phosphatase: 72 U/L (ref 38–126)
Anion gap: 4 — ABNORMAL LOW (ref 5–15)
BUN: 22 mg/dL (ref 8–23)
CO2: 24 mmol/L (ref 22–32)
Calcium: 8.7 mg/dL — ABNORMAL LOW (ref 8.9–10.3)
Chloride: 108 mmol/L (ref 98–111)
Creatinine, Ser: 1.14 mg/dL (ref 0.61–1.24)
GFR, Estimated: >60 mL/min (ref >60)
Glucose, Bld: 217 mg/dL — ABNORMAL HIGH (ref 70–99)
Potassium: 4.1 mmol/L (ref 3.5–5.1)
Sodium: 136 mmol/L (ref 135–145)
Total Bilirubin: 0.6 mg/dL (ref 0.3–1.2)
Total Protein: 7.4 g/dL (ref 6.5–8.1)

## 2022-03-05 NOTE — Progress Notes (Signed)
Hematology/Oncology Consult note [facility]  [phone] [phone]  Patient Care Team: Leonel Ramsay, MD as PCP - General (Infectious Diseases) Crissie Sickles, MD as Referring Physician (Hematology and Oncology) Crissie Sickles, MD as Referring Physician (Hematology and Oncology)   Name of the patient: Alex Smith  [id number]  Jun 29, 1935   Date of visit: 03/05/22  Diagnosis-  History of hairy cell leukemia 2.  Smoldering multiple myeloma under observation 3.  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 [name] and is transitioning his care to me.  He has following issues:   1.  Hairy cell leukemia that was initially diagnosed on bone marrow biopsy in June 2016.  Bone marrow biopsy at that time showed extensive marrow involvement with recurrent hairy cell leukemia approximately 80% of the cells in the core 10% monoclonal plasma cell infiltrate compatible with plasma cell neoplasm.  Peripheral smear revealed leukopenia with a white count of 1900 compatible with hairy cell leukemia. 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  FISH studies for MDS were negative.  Cytogenetics were normal (46, XY).  He has last received cladribine for this back in July 2016   2.  Smoldering multiple myeloma: Bone marrow biopsy in March 2017 revealed persistent plasma cell neoplasm with monoclonal plasma cells estimated at 20 to 30%.  No morphologic evidence of residual hairy cell leukemia.Marrow was normocellular for age with relative erythroid hyperplasia, relative myeloid hypoplasia, mild dyspoiesis, adequate megakaryocytes and no increased blasts.  There was no significant increase in marrow reticulin fibers.  Iron was present.   FISH studies for MDS were negative.  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  Cytogenetics were normal (46, XY).  Last bone marrow biopsy was in October 2018 which showed variably cellular marrow 10 to 50% with kappa restricted plasmacytosis of 10 to 15%.  No evidence of leukemia lymphoma or high-grade dysplasia.  Normal cytogenetics.  He has also been followed by Dr. [name] at [facility].  He has received Revlimid and Decadron in the past in 2017 which was subsequently stopped.  PET scan in July 2017 showed no hypermetabolic adenopathy or focal skeletal lesions.   3.  Probable MDS: He has received a trial of Procrit in the past in 2017 but apparently did not respond to it.  He has been getting periodic blood transfusions to maintain his hemoglobin closer to 9 as he is symptomatic when his hemoglobin is less than 9.  He has been getting about 2-3 blood transfusions during the year.  He does have some mild leukopenia/neutropenia and is on acyclovir prophylaxis.  He gets weekly G-CSF when his ANC is less than 1.5.   4.  Also has history of B12 deficiency for which he is on oral B12.    Interval history-patient is doing well and has not had any recent hospitalizations or falls.  Has baseline fatigue.  ECOG PS- 2 Pain scale- 0   Review of systems- Review of Systems  Constitutional:  Positive for malaise/fatigue. Negative for chills, fever and weight loss.  HENT:  Negative for congestion, ear discharge and nosebleeds.   Eyes:  Negative for blurred vision.  Respiratory:  Negative for cough, hemoptysis, sputum production, shortness of breath  and wheezing.   Cardiovascular:  Negative for chest pain, palpitations, orthopnea and claudication.  Gastrointestinal:  Negative for abdominal pain, blood in stool, constipation, diarrhea, heartburn, melena, nausea and vomiting.  Genitourinary:  Negative for dysuria, flank pain, frequency, hematuria and urgency.   Musculoskeletal:  Negative for back pain, joint pain and myalgias.  Skin:  Negative for rash.  Neurological:  Negative for dizziness, tingling, focal weakness, seizures, weakness and headaches.  Endo/Heme/Allergies:  Does not bruise/bleed easily.  Psychiatric/Behavioral:  Negative for depression and suicidal ideas. The patient does not have insomnia.       Allergies  Allergen Reactions   Rituximab Rash    Chest tightness Chest tightness   Blood-Group Specific Substance Other (See Comments)    Had a post transfusion reaction of red blood cells; NOW REQUIRES WASHED BLOOD CELLS Had a post transfusion reaction of red blood cells; NOW REQUIRES WASHED BLOOD CELLS   Primaxin [Imipenem] Other (See Comments)    Possible allergy Tolerates cephalosporins   Voriconazole Other (See Comments)   Sulfa Antibiotics Itching and Rash   Sulfacetamide Sodium Itching and Rash     Past Medical History:  Diagnosis Date   Alzheimer's dementia ([place]) 08/18/2021   Anemia    B12 deficiency    Basal cell carcinoma 03/30/2018   left ant nasal ala   Basal cell carcinoma of face 01/19/2013   right distal dorsum nose   BPH (benign prostatic hypertrophy)    followed by urology, discharged (Dr. Bernardo Heater)   CAP (community acquired pneumonia) 02/15/2015   CKD stage 3 due to type 2 diabetes mellitus ([place]) 11/14/2017   Colon polyps    Diverticulosis    Dysplastic nevus 10/27/2006   right med calf   GERD (gastroesophageal reflux disease)    Hairy cell leukemia ([place]) 2006   recurrent, seizure on rituxan, now on cladribine [name])   History of pneumonia 2000's   "once" (07/07/2012)   History of shingles    HLD (hyperlipidemia)    Hypertension    Pneumonia    Shortness of breath dyspnea    Squamous cell carcinoma in situ 07/05/2020   Left lat. pretibial. 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 EDC 09/07/2020   Squamous cell carcinoma of skin 04/03/2020    Left cheek. WD SCC   Systolic murmur 74/03/1447   Type 2 diabetes, controlled, with retinopathy ([place])      Past Surgical History:  Procedure Laterality Date   25 GAUGE PARS PLANA VITRECTOMY WITH 20 GAUGE MVR PORT FOR MACULAR HOLE  07/07/2012   Procedure: 25 GAUGE PARS PLANA VITRECTOMY WITH 20 GAUGE MVR PORT FOR MACULAR HOLE;  Surgeon: Hayden Pedro, MD;  Location: [place];  Service: Ophthalmology;  Laterality: Left;   BONE MARROW BIOPSY  2016   CARDIOVASCULAR STRESS TEST  2013   treadmill - no evidence ischemia, EF 61%   CATARACT EXTRACTION W/ INTRAOCULAR LENS  IMPLANT, BILATERAL  ~ 2010   COLONOSCOPY  2014   [place] WNL no rpt needed, h/o polyps   EYE SURGERY Left 06/2012   laser surgery   GAS INSERTION  07/07/2012   Procedure: INSERTION OF GAS;  Surgeon: Hayden Pedro, MD;  Location: [place];  Service: Ophthalmology;  Laterality: Left;  C3F8   PERIPHERAL VASCULAR CATHETERIZATION N/A 02/23/2015   Procedure: Glori Luis Cath Insertion;  Surgeon: Algernon Huxley, MD;  Location: [place] [facility];  Service: Cardiovascular;  Laterality: N/A;   PORTA CATH  REMOVAL N/A 02/16/2020   Procedure: PORTA CATH REMOVAL;  Surgeon: Algernon Huxley, MD;  Location: [place] [facility];  Service: Cardiovascular;  Laterality: N/A;   SERUM PATCH  07/07/2012   Procedure: SERUM PATCH;  Surgeon: Hayden Pedro, MD;  Location: [place];  Service: Ophthalmology;  Laterality: Left;   SKIN CANCER EXCISION     "all over my face" (07/07/2012)    Social History   Socioeconomic History   Marital status: Married    Spouse name: Not on file   Number of children: Not on file   Years of education: Not on file   Highest education level: Not on file  Occupational History   Not on file  Tobacco Use   Smoking status: Former    Packs/day: 2.00    Years: 25.00    Total pack years: 50.00    Types: Cigarettes    Quit date: 02/06/1969    Years since quitting: 53.1   Smokeless tobacco: Never   Tobacco comments:    07/07/2012  "stopped smoking ~ 40 yr ago; smoked 20-73yr  Vaping Use   Vaping Use: Never used  Substance and Sexual Activity   Alcohol use: Not Currently   Drug use: No   Sexual activity: Never  Other Topics Concern   Not on file  Social History Narrative   Lives at home alone. 1 cat   Occupation: was cArt gallery manager works part time for [employer]   Activity: likes to golf   Diet: moderate water, fruits/vegetables daily   Social Determinants of HRadio broadcast assistantStrain: Not on file  Food Insecurity: Not on file  Transportation Needs: Not on file  Physical Activity: Not on file  Stress: Not on file  Social Connections: Not on file  Intimate Partner Violence: Not on file    Family History  Problem Relation Age of Onset   Dementia Mother    Heart failure Father 750  Cancer Sister        breast   Diabetes Paternal Uncle    Diabetes Paternal Aunt    CAD Brother 49      MI   Stroke Neg Hx      Current Outpatient Medications:    amLODipine (NORVASC) 5 MG tablet, Take 1 tablet (5 mg total) by mouth daily., Disp: 30 tablet, Rfl: 2   aspirin EC 81 MG tablet, Take 81 mg by mouth daily., Disp: , Rfl:    doxazosin (CARDURA) 1 MG tablet, TAKE ONE TABLET BY MOUTH EVERY DAY, Disp: 30 tablet, Rfl: 5   famotidine (PEPCID) 20 MG tablet, Take 20 mg by mouth daily., Disp: , Rfl:    feeding supplement (ENSURE ENLIVE / ENSURE PLUS) LIQD, Take 237 mLs by mouth 2 (two) times daily between meals., Disp: 237 mL, Rfl: 12   insulin aspart (NOVOLOG) 100 UNIT/ML injection, Inject into the skin 3 (three) times daily before meals. As needed on sliding scale121-150 gets 1 unit, 151 - 200  gets 2 units , 251-300-3 units, 301  to 350  gets 7 units , 9 units, over 400 call md, Disp: , Rfl:    insulin glargine (LANTUS) 100 UNIT/ML Solostar Pen, Inject 15 Units into the skin at bedtime., Disp: , Rfl:    lisinopril (ZESTRIL) 30 MG tablet, Take 30 mg by mouth daily., Disp: , Rfl:    Multiple Vitamin  (MULTIVITAMIN WITH MINERALS) TABS tablet, Take 1 tablet by mouth daily., Disp: , Rfl:  NON FORMULARY, Apply 1 Dose topically in the morning and at bedtime. AMITIPS5%/Gaba10%/LIDO5% to arms, legs and abdomen, Disp: , Rfl:    PAIN RELIEF EXTRA STRENGTH 500 MG tablet, Take 1,000 mg by mouth in the morning and at bedtime. Tylenol, Disp: , Rfl:    pravastatin (PRAVACHOL) 20 MG tablet, TAKE ONE TABLET EVERY EVENING, Disp: 30 tablet, Rfl: 0   sitaGLIPtin (JANUVIA) 50 MG tablet, Take 50 mg by mouth daily., Disp: , Rfl:    tamsulosin (FLOMAX) 0.4 MG CAPS capsule, TAKE 1 CAPSULE BY MOUTH EVERY DAY, Disp: 30 capsule, Rfl: 3   vitamin B-12 (CYANOCOBALAMIN) 1000 MCG tablet, Take 1 tablet (1,000 mcg total) by mouth daily., Disp: 90 tablet, Rfl: 3   vitamin E 400 UNIT capsule, Take 400 Units by mouth daily., Disp: , Rfl:    NON FORMULARY, Apply 1 application topically as needed. AMITIP5% / GABA10% / LIDO5% Apply topically to arms, abdomen and legs as needed. *Provided by patient's family*, Disp: , Rfl:    traMADol (ULTRAM) 50 MG tablet, Take 1 tablet (50 mg total) by mouth every 6 (six) hours as needed for up to 4 doses for moderate pain., Disp: 4 tablet, Rfl: 0 No current facility-administered medications for this visit.  Facility-Administered Medications Ordered in Other Visits:    heparin lock flush 100 unit/mL, 500 Units, Intravenous, Once, Corcoran, Melissa C, MD   sodium chloride 0.9 % injection 10 mL, 10 mL, Intravenous, PRN, [name], Melissa C, MD, 10 mL at 03/03/15 0903   sodium chloride 0.9 % injection 10 mL, 10 mL, Intracatheter, PRN, [name], Melissa C, MD, 10 mL at 03/10/15 1410   sodium chloride flush (NS) 0.9 % injection 10 mL, 10 mL, Intravenous, PRN, [name], Melissa C, MD, 10 mL at 07/23/18 1517  Physical exam:  Vitals:   03/05/22 1403  BP: (!) 171/108  Pulse: 73  Resp: 16  Temp: 98.1 F (36.7 C)  TempSrc: Oral  Weight: 203 lb 12.8 oz (92.4 kg)  Height: 5' 4.25" (1.632 m)    Physical Exam Constitutional:      General: He is not in acute distress. Cardiovascular:     Rate and Rhythm: Normal rate and regular rhythm.     Heart sounds: Normal heart sounds.  Pulmonary:     Effort: Pulmonary effort is normal.     Breath sounds: Normal breath sounds.  Abdominal:     General: Bowel sounds are normal.     Palpations: Abdomen is soft.  Skin:    General: Skin is warm and dry.  Neurological:     Mental Status: He is alert and oriented to person, place, and time.         Latest Ref Rng & Units 03/05/2022    1:31 PM  CMP  Glucose 70 - 99 mg/dL 217   BUN 8 - 23 mg/dL 22   Creatinine 0.61 - 1.24 mg/dL 1.14   Sodium 135 - 145 mmol/L 136   Potassium 3.5 - 5.1 mmol/L 4.1   Chloride 98 - 111 mmol/L 108   CO2 22 - 32 mmol/L 24   Calcium 8.9 - 10.3 mg/dL 8.7   Total Protein 6.5 - 8.1 g/dL 7.4   Total Bilirubin 0.3 - 1.2 mg/dL 0.6   Alkaline Phos 38 - 126 U/L 72   AST 15 - 41 U/L 14   ALT 0 - 44 U/L 12       Latest Ref Rng & Units 03/05/2022    1:31 PM  CBC  WBC 4.0 - 10.5 K/uL 2.7   Hemoglobin 13.0 - 17.0 g/dL 10.1   Hematocrit 39.0 - 52.0 % 30.8   Platelets 150 - 400 K/uL 113      Assessment and plan- Patient is a 87 y.o. male who is here for follow-up of following issues:  MDS: Currently on Luspatercept.  Today her blood pressure in our office is 941 systolic.  We are therefore holding his Luspatercept.  Ideally I would like his blood pressure to be under good control since Luspatercept can be associated with hypertension and 10% of the cases.  We will reach out to the [facility] and have them check his blood pressure daily for the next 1 week.  If blood pressure remains more than 140/90 he is blood pressure medications need to be adjusted.  Hopefully he can get his Luspatercept next week.  We will try to space out his injections every 5 weeks at this point.  Hemoglobin has remained stable on Luspatercept around 10.  He has some mild baseline  thrombocytopenia with a platelet count in the 100s to 110s.  Waxing and waning leukopenia which is stable and ANC remains more than 1.  Smoldering multiple myeloma: Myeloma panel and serum free light chains from today are pending.  We will repeat again in 3 months   Visit Diagnosis 1. 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 High risk medication use   3. Smoldering multiple myeloma      Dr. Randa Evens, MD, MPH [facility] [phone] 03/05/2022 4:02 PM

## 2022-03-05 NOTE — Telephone Encounter (Signed)
The staff member that I spoke to gave me there fax [phone]. I explained that we will want staff to check b/p and let us know what the numbers are so that we can monitor and make sure b/P getting better or do we need to get PCP to help in order for pt to get shot next tues.. Fax was sent and conformation that it went through

## 2022-03-06 LAB — KAPPA/LAMBDA LIGHT CHAINS
Kappa free light chain: 73.8 mg/L — ABNORMAL HIGH (ref 3.3–19.4)
Kappa, lambda light chain ratio: 5.9 — ABNORMAL HIGH (ref 0.26–1.65)
Lambda free light chains: 12.5 mg/L (ref 5.7–26.3)

## 2022-03-08 LAB — MULTIPLE MYELOMA PANEL, SERUM
Albumin SerPl Elph-Mcnc: 3.7 g/dL (ref 2.9–4.4)
Albumin/Glob SerPl: 1.2 (ref 0.7–1.7)
Alpha 1: 0.2 g/dL (ref 0.0–0.4)
Alpha2 Glob SerPl Elph-Mcnc: 0.6 g/dL (ref 0.4–1.0)
B-Globulin SerPl Elph-Mcnc: 0.8 g/dL (ref 0.7–1.3)
Gamma Glob SerPl Elph-Mcnc: 1.5 g/dL (ref 0.4–1.8)
Globulin, Total: 3.1 g/dL (ref 2.2–3.9)
IgA: 18 mg/dL — ABNORMAL LOW (ref 61–437)
IgG (Immunoglobin G), Serum: 1666 mg/dL — ABNORMAL HIGH (ref 603–1613)
IgM (Immunoglobulin M), Srm: 27 mg/dL (ref 15–143)
M Protein SerPl Elph-Mcnc: 1.4 g/dL — ABNORMAL HIGH
Total Protein ELP: 6.8 g/dL (ref 6.0–8.5)

## 2022-03-12 ENCOUNTER — Inpatient Hospital Stay: Payer: Medicare Other

## 2022-03-12 DIAGNOSIS — D46Z Other myelodysplastic syndromes: Secondary | ICD-10-CM

## 2022-03-12 DIAGNOSIS — Z5111 Encounter for antineoplastic chemotherapy: Secondary | ICD-10-CM | POA: Diagnosis not present

## 2022-03-12 MED ORDER — LUSPATERCEPT-AAMT 75 MG ~~LOC~~ SOLR
100.0000 mg | SUBCUTANEOUS | Status: DC
  Administered 2022-03-12: 100 mg via SUBCUTANEOUS
  Filled 2022-03-12: qty 1.5

## 2022-03-20 DIAGNOSIS — R635 Abnormal weight gain: Secondary | ICD-10-CM | POA: Insufficient documentation

## 2022-03-26 ENCOUNTER — Telehealth: Payer: Self-pay

## 2022-03-26 ENCOUNTER — Other Ambulatory Visit: Payer: Medicare Other

## 2022-03-26 ENCOUNTER — Inpatient Hospital Stay: Payer: Medicare Other | Attending: Nurse Practitioner

## 2022-03-26 DIAGNOSIS — R509 Fever, unspecified: Secondary | ICD-10-CM | POA: Diagnosis not present

## 2022-03-26 DIAGNOSIS — R410 Disorientation, unspecified: Secondary | ICD-10-CM | POA: Insufficient documentation

## 2022-03-26 DIAGNOSIS — D46Z Other myelodysplastic syndromes: Secondary | ICD-10-CM | POA: Diagnosis not present

## 2022-03-26 LAB — CBC WITH DIFFERENTIAL/PLATELET
Abs Immature Granulocytes: 0.02 10*3/uL (ref 0.00–0.07)
Basophils Absolute: 0 10*3/uL (ref 0.0–0.1)
Basophils Relative: 1 %
Eosinophils Absolute: 0.1 10*3/uL (ref 0.0–0.5)
Eosinophils Relative: 2 %
HCT: 31.7 % — ABNORMAL LOW (ref 39.0–52.0)
Hemoglobin: 10.7 g/dL — ABNORMAL LOW (ref 13.0–17.0)
Immature Granulocytes: 1 %
Lymphocytes Relative: 25 %
Lymphs Abs: 1 10*3/uL (ref 0.7–4.0)
MCH: 34.7 pg — ABNORMAL HIGH (ref 26.0–34.0)
MCHC: 33.8 g/dL (ref 30.0–36.0)
MCV: 102.9 fL — ABNORMAL HIGH (ref 80.0–100.0)
Monocytes Absolute: 0.3 10*3/uL (ref 0.1–1.0)
Monocytes Relative: 7 %
Neutro Abs: 2.8 10*3/uL (ref 1.7–7.7)
Neutrophils Relative %: 64 %
Platelets: 100 10*3/uL — ABNORMAL LOW (ref 150–400)
RBC: 3.08 MIL/uL — ABNORMAL LOW (ref 4.22–5.81)
RDW: 15.5 % (ref 11.5–15.5)
WBC: 4.2 10*3/uL (ref 4.0–10.5)
nRBC: 0 % (ref 0.0–0.2)

## 2022-03-26 NOTE — Telephone Encounter (Signed)
Spoke to [place] at [facility] in regards to pts BP reading for one week. BP were taken but results were not received due to facility not having our fax number. Provided Dustin with fax number and results should be faxed shortly.

## 2022-03-31 IMAGING — CR DG HIP (WITH OR WITHOUT PELVIS) 2-3V*L*
1 series · 3 of 3 positions shown · non-contrast
Comparison: None.

CLINICAL DATA: Left hip pain, no known injury

EXAM:
DG HIP (WITH OR WITHOUT PELVIS) 2-3V LEFT

[Series 1: dg hip unilat w or w/o pelvis 2-3 views  · non-contrast · 0.14mm/px · 3 of 3 slices shown]
[im 1/3]
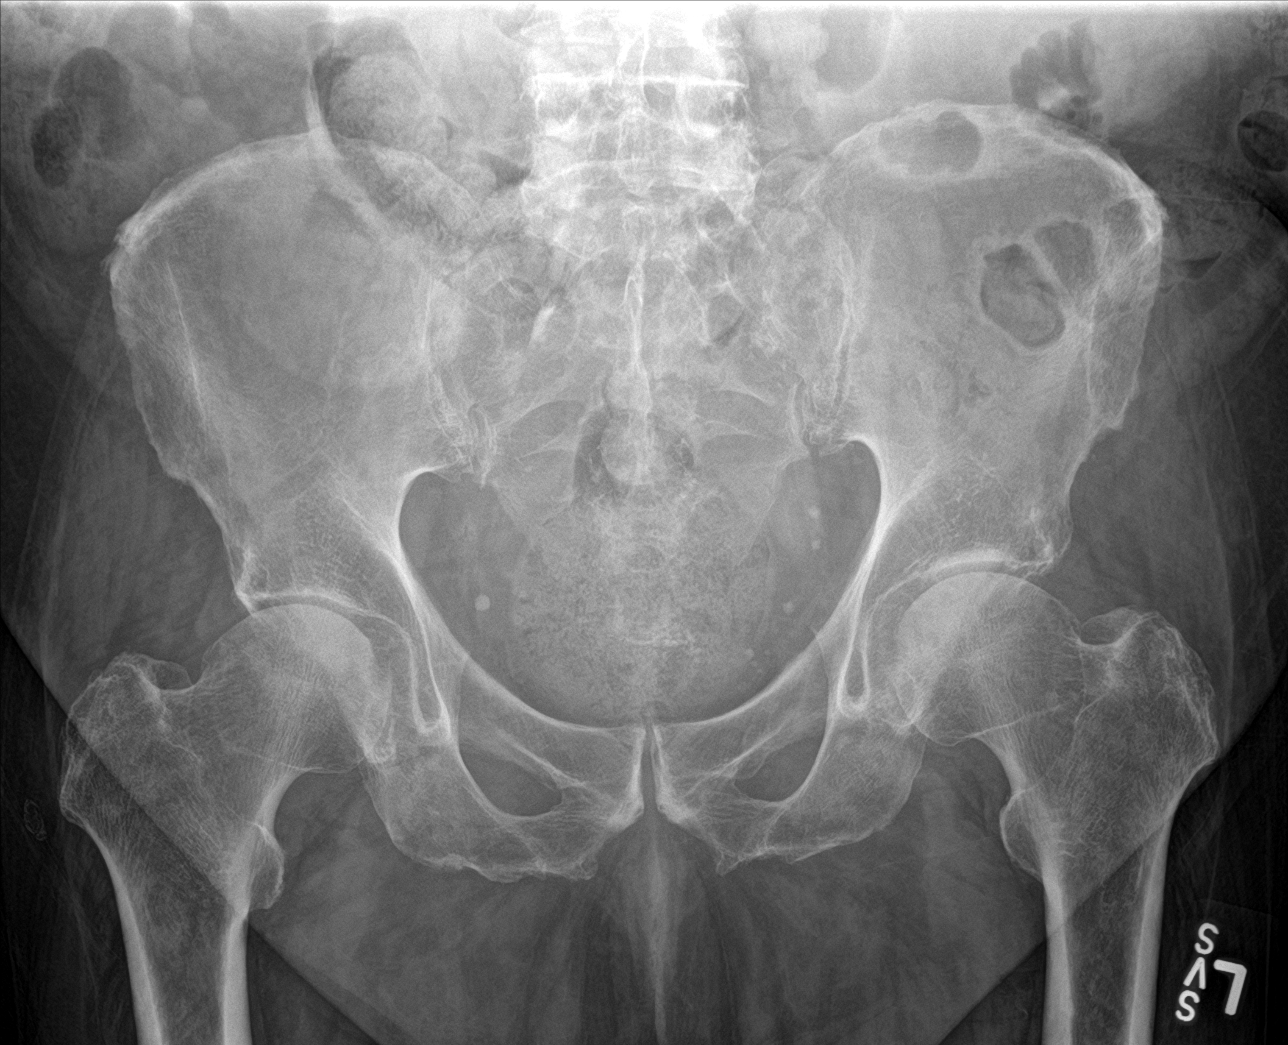
[im 2/3]
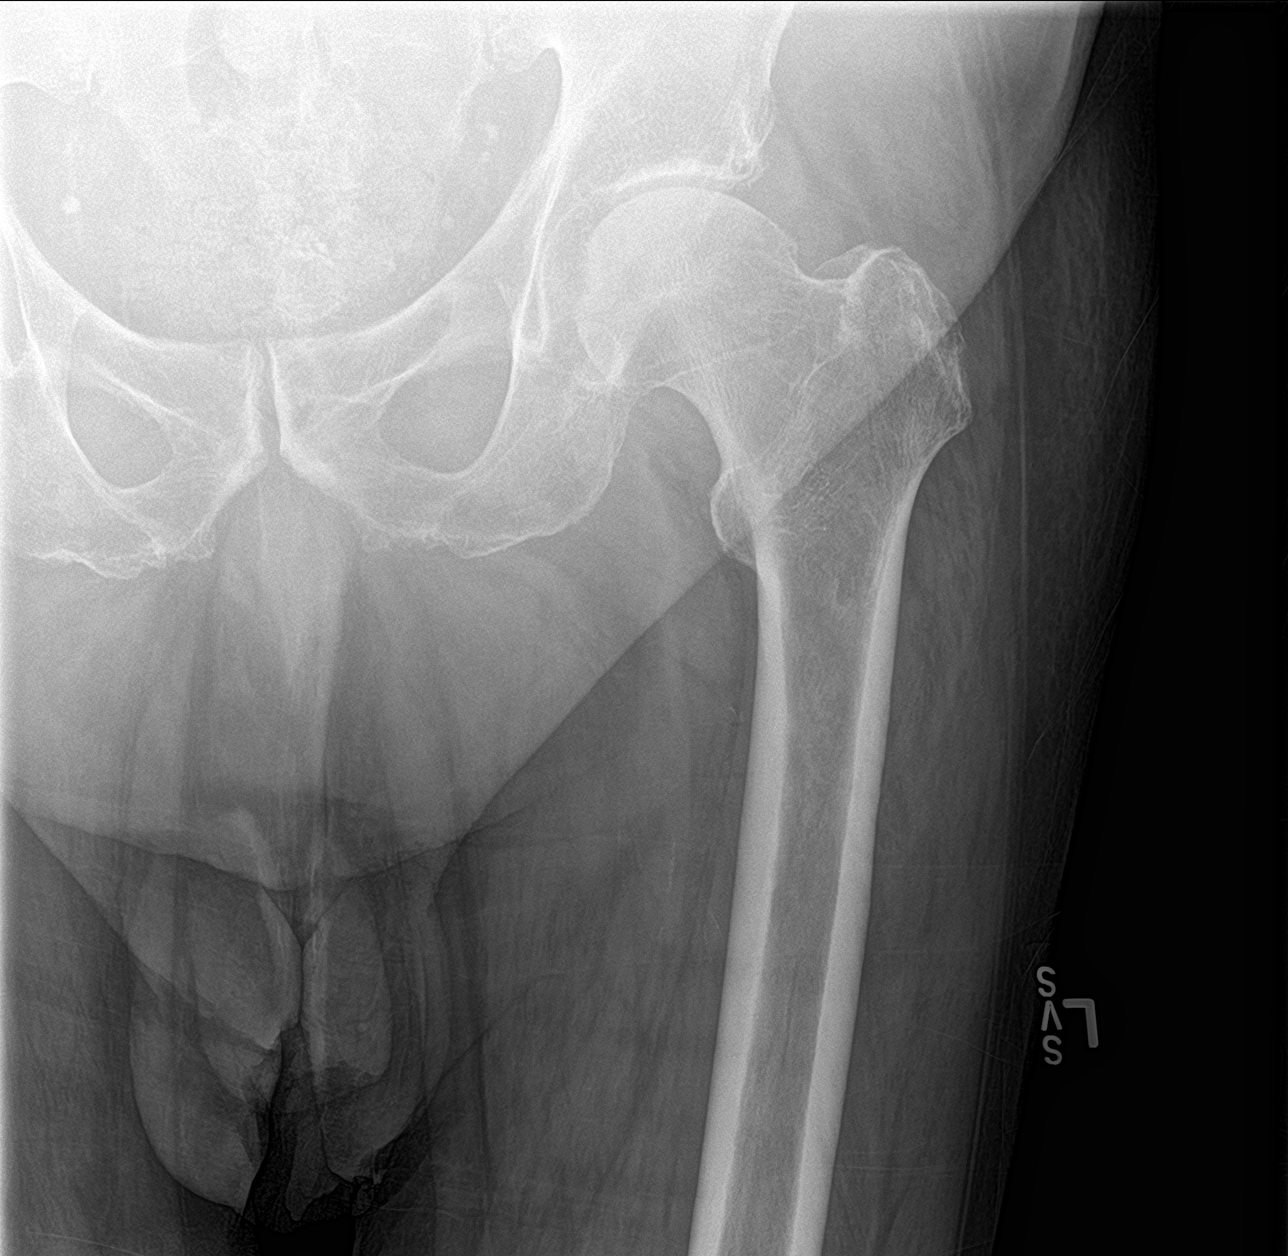
[im 3/3]
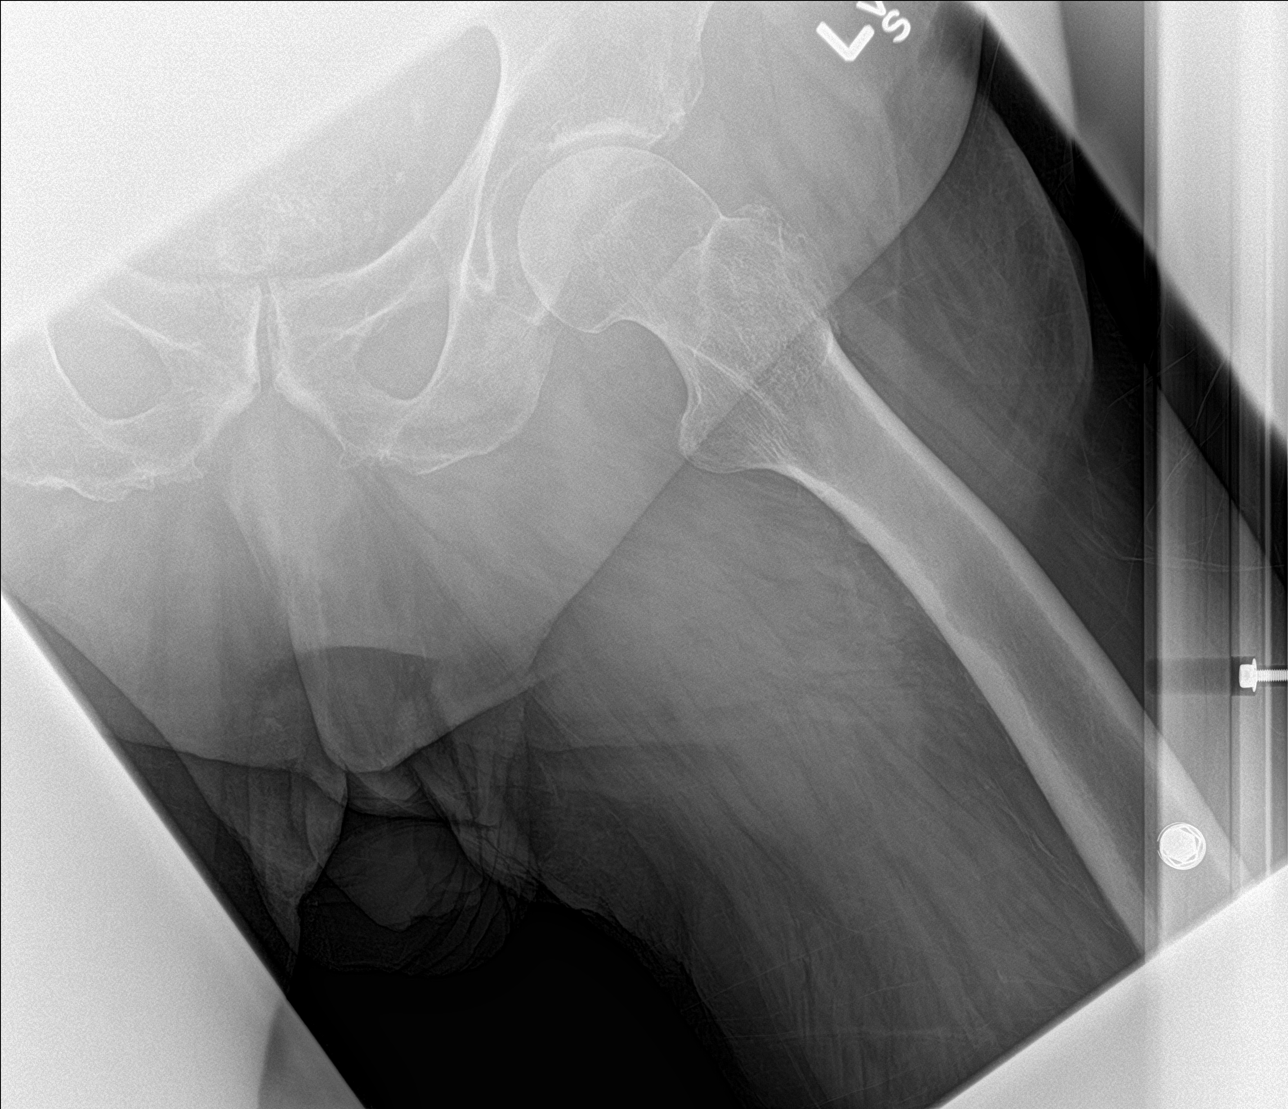

[3 of 3 positions shown; findings below may reference images not displayed]

FINDINGS: There is no evidence of hip fracture or dislocation. Mild superior
joint space loss without significant osteophytosis. Nonobstructive
pattern of overlying bowel gas. Phleboliths in the pelvis.
IMPRESSION: No fracture or dislocation of the left hip. Mild superior joint
space loss without significant osteophytosis.

## 2022-04-09 ENCOUNTER — Inpatient Hospital Stay: Payer: Medicare Other

## 2022-04-09 ENCOUNTER — Encounter: Payer: Self-pay | Admitting: Emergency Medicine

## 2022-04-09 ENCOUNTER — Emergency Department: Payer: Medicare Other

## 2022-04-09 ENCOUNTER — Other Ambulatory Visit: Payer: Self-pay | Admitting: *Deleted

## 2022-04-09 ENCOUNTER — Other Ambulatory Visit: Payer: Self-pay

## 2022-04-09 ENCOUNTER — Emergency Department
Admission: EM | Admit: 2022-04-09 | Discharge: 2022-04-10 | Disposition: A | Discharge status: Skilled Nursing Facility | Payer: Medicare Other | Source: Home / Self Care | Attending: Emergency Medicine | Admitting: Emergency Medicine

## 2022-04-09 DIAGNOSIS — A419 Sepsis, unspecified organism: Secondary | ICD-10-CM | POA: Insufficient documentation

## 2022-04-09 DIAGNOSIS — R7989 Other specified abnormal findings of blood chemistry: Secondary | ICD-10-CM | POA: Insufficient documentation

## 2022-04-09 DIAGNOSIS — Z8579 Personal history of other malignant neoplasms of lymphoid, hematopoietic and related tissues: Secondary | ICD-10-CM | POA: Insufficient documentation

## 2022-04-09 DIAGNOSIS — R4182 Altered mental status, unspecified: Secondary | ICD-10-CM | POA: Insufficient documentation

## 2022-04-09 DIAGNOSIS — D46Z Other myelodysplastic syndromes: Secondary | ICD-10-CM

## 2022-04-09 DIAGNOSIS — U071 COVID-19: Secondary | ICD-10-CM | POA: Insufficient documentation

## 2022-04-09 LAB — CBC WITH DIFFERENTIAL/PLATELET
Abs Immature Granulocytes: 0.02 10*3/uL (ref 0.00–0.07)
Abs Immature Granulocytes: 0.02 10*3/uL (ref 0.00–0.07)
Basophils Absolute: 0 10*3/uL (ref 0.0–0.1)
Basophils Absolute: 0 10*3/uL (ref 0.0–0.1)
Basophils Relative: 1 %
Basophils Relative: 0 %
Eosinophils Absolute: 0.1 10*3/uL (ref 0.0–0.5)
Eosinophils Absolute: 0 10*3/uL (ref 0.0–0.5)
Eosinophils Relative: 2 %
Eosinophils Relative: 1 %
HCT: 30.8 % — ABNORMAL LOW (ref 39.0–52.0)
HCT: 32.1 % — ABNORMAL LOW (ref 39.0–52.0)
Hemoglobin: 10.2 g/dL — ABNORMAL LOW (ref 13.0–17.0)
Hemoglobin: 10.5 g/dL — ABNORMAL LOW (ref 13.0–17.0)
Immature Granulocytes: 1 %
Immature Granulocytes: 1 %
Lymphocytes Relative: 19 %
Lymphocytes Relative: 18 %
Lymphs Abs: 0.7 10*3/uL (ref 0.7–4.0)
Lymphs Abs: 0.7 10*3/uL (ref 0.7–4.0)
MCH: 34.2 pg — ABNORMAL HIGH (ref 26.0–34.0)
MCH: 33.4 pg (ref 26.0–34.0)
MCHC: 33.1 g/dL (ref 30.0–36.0)
MCHC: 32.7 g/dL (ref 30.0–36.0)
MCV: 103.4 fL — ABNORMAL HIGH (ref 80.0–100.0)
MCV: 102.2 fL — ABNORMAL HIGH (ref 80.0–100.0)
Monocytes Absolute: 0.2 10*3/uL (ref 0.1–1.0)
Monocytes Absolute: 0.3 10*3/uL (ref 0.1–1.0)
Monocytes Relative: 6 %
Monocytes Relative: 8 %
Neutro Abs: 2.7 10*3/uL (ref 1.7–7.7)
Neutro Abs: 2.7 10*3/uL (ref 1.7–7.7)
Neutrophils Relative %: 71 %
Neutrophils Relative %: 72 %
Platelets: 100 10*3/uL — ABNORMAL LOW (ref 150–400)
Platelets: 103 10*3/uL — ABNORMAL LOW (ref 150–400)
RBC: 2.98 MIL/uL — ABNORMAL LOW (ref 4.22–5.81)
RBC: 3.14 MIL/uL — ABNORMAL LOW (ref 4.22–5.81)
RDW: 15.3 % (ref 11.5–15.5)
RDW: 15.3 % (ref 11.5–15.5)
WBC: 3.6 10*3/uL — ABNORMAL LOW (ref 4.0–10.5)
WBC: 3.7 10*3/uL — ABNORMAL LOW (ref 4.0–10.5)
nRBC: 0 % (ref 0.0–0.2)
nRBC: 0 % (ref 0.0–0.2)

## 2022-04-09 LAB — PROTIME-INR
INR: 1 (ref 0.8–1.2)
Prothrombin Time: 13.2 seconds (ref 11.4–15.2)

## 2022-04-09 LAB — COMPREHENSIVE METABOLIC PANEL
ALT: 11 U/L (ref 0–44)
ALT: 11 U/L (ref 0–44)
AST: 14 U/L — ABNORMAL LOW (ref 15–41)
AST: 15 U/L (ref 15–41)
Albumin: 3.7 g/dL (ref 3.5–5.0)
Albumin: 3.8 g/dL (ref 3.5–5.0)
Alkaline Phosphatase: 71 U/L (ref 38–126)
Alkaline Phosphatase: 73 U/L (ref 38–126)
Anion gap: 4 — ABNORMAL LOW (ref 5–15)
Anion gap: 6 (ref 5–15)
BUN: 29 mg/dL — ABNORMAL HIGH (ref 8–23)
BUN: 26 mg/dL — ABNORMAL HIGH (ref 8–23)
CO2: 22 mmol/L (ref 22–32)
CO2: 22 mmol/L (ref 22–32)
Calcium: 8.2 mg/dL — ABNORMAL LOW (ref 8.9–10.3)
Calcium: 8.6 mg/dL — ABNORMAL LOW (ref 8.9–10.3)
Chloride: 108 mmol/L (ref 98–111)
Chloride: 108 mmol/L (ref 98–111)
Creatinine, Ser: 1.26 mg/dL — ABNORMAL HIGH (ref 0.61–1.24)
Creatinine, Ser: 1.3 mg/dL — ABNORMAL HIGH (ref 0.61–1.24)
GFR, Estimated: 56 mL/min — ABNORMAL LOW (ref >60)
GFR, Estimated: 54 mL/min — ABNORMAL LOW (ref >60)
Glucose, Bld: 320 mg/dL — ABNORMAL HIGH (ref 70–99)
Glucose, Bld: 192 mg/dL — ABNORMAL HIGH (ref 70–99)
Potassium: 4.3 mmol/L (ref 3.5–5.1)
Potassium: 4.1 mmol/L (ref 3.5–5.1)
Sodium: 134 mmol/L — ABNORMAL LOW (ref 135–145)
Sodium: 136 mmol/L (ref 135–145)
Total Bilirubin: 0.6 mg/dL (ref 0.3–1.2)
Total Bilirubin: 0.9 mg/dL (ref 0.3–1.2)
Total Protein: 7.1 g/dL (ref 6.5–8.1)
Total Protein: 7.3 g/dL (ref 6.5–8.1)

## 2022-04-09 LAB — LACTIC ACID, PLASMA: Lactic Acid, Venous: 1.4 mmol/L (ref 0.5–1.9)

## 2022-04-09 LAB — TROPONIN I (HIGH SENSITIVITY): Troponin I (High Sensitivity): 10 ng/L (ref <18)

## 2022-04-09 LAB — PROCALCITONIN: Procalcitonin: <0.1 ng/mL

## 2022-04-09 LAB — RESP PANEL BY RT-PCR (FLU A&B, COVID) ARPGX2
Influenza A by PCR: NEGATIVE
Influenza B by PCR: NEGATIVE
SARS Coronavirus 2 by RT PCR: POSITIVE — AB

## 2022-04-09 LAB — APTT: aPTT: 32 seconds (ref 24–36)

## 2022-04-09 MED ORDER — VANCOMYCIN HCL IN DEXTROSE 1-5 GM/200ML-% IV SOLN: 1000.0000 mg | Freq: Once | INTRAVENOUS | Status: DC

## 2022-04-09 MED ORDER — IOHEXOL 300 MG/ML  SOLN
100.0000 mL | Freq: Once | INTRAMUSCULAR | Status: DC | PRN
Start: 2022-04-09 — End: 2022-04-09

## 2022-04-09 MED ORDER — LUSPATERCEPT-AAMT 75 MG ~~LOC~~ SOLR
100.0000 mg | SUBCUTANEOUS | Status: DC
  Administered 2022-04-09: 100 mg via SUBCUTANEOUS
  Filled 2022-04-09: qty 1.5

## 2022-04-09 MED ORDER — METRONIDAZOLE 500 MG/100ML IV SOLN
500.0000 mg | Freq: Once | INTRAVENOUS | Status: AC
  Administered 2022-04-09: 500 mg via INTRAVENOUS
  Filled 2022-04-09: qty 100

## 2022-04-09 MED ORDER — IOHEXOL 350 MG/ML SOLN
100.0000 mL | Freq: Once | INTRAVENOUS | Status: AC | PRN
  Administered 2022-04-09: 100 mL via INTRAVENOUS

## 2022-04-09 MED ORDER — VANCOMYCIN HCL 2000 MG/400ML IV SOLN
2000.0000 mg | Freq: Once | INTRAVENOUS | Status: AC
  Administered 2022-04-09: 2000 mg via INTRAVENOUS
  Filled 2022-04-09: qty 400

## 2022-04-09 MED ORDER — SODIUM CHLORIDE 0.9 % IV SOLN
2.0000 g | Freq: Once | INTRAVENOUS | Status: AC
  Administered 2022-04-09: 2 g via INTRAVENOUS
  Filled 2022-04-09: qty 12.5

## 2022-04-09 MED ORDER — SODIUM CHLORIDE 0.9 % IV BOLUS
1000.0000 mL | Freq: Once | INTRAVENOUS | Status: AC
  Administered 2022-04-09: 1000 mL via INTRAVENOUS

## 2022-04-09 NOTE — Progress Notes (Signed)
CODE SEPSIS - PHARMACY COMMUNICATION  **Broad Spectrum Antibiotics should be administered within 1 hour of Sepsis diagnosis**  Time Code Sepsis Called/Page Received: 2153  Antibiotics Ordered: Cefepime + vancomycin + metronidazole  Time of 1st antibiotic administration: 2208  Additional action taken by pharmacy: N/A  Benita Gutter 04/09/2022  10:09 PM

## 2022-04-09 NOTE — Progress Notes (Signed)
CODE SEPSIS - PHARMACY COMMUNICATION  **Broad Spectrum Antibiotics should be administered within 1 hour of Sepsis diagnosis**  Time Code Sepsis Called/Page Received: 2206  Antibiotics Ordered: Cefepime, Vancomycin, Flagyl  Time of 1st antibiotic administration: 2208  Renda Rolls, PharmD, [facility] 04/09/2022 10:01 PM

## 2022-04-09 NOTE — Consult Note (Signed)
PHARMACY -  BRIEF ANTIBIOTIC NOTE   Pharmacy has received consult(s) for cefepime and vancomycin from an ED provider. Patient is also ordered metronidazole. The patient's profile has been reviewed for ht/wt/allergies/indication/available labs.    One time order(s) placed for  --Cefepime 2 g IV --Vancomycin 1 g IV  Further antibiotics/pharmacy consults should be ordered by admitting physician if indicated.                       Thank you, Benita Gutter 04/09/2022  9:58 PM

## 2022-04-09 NOTE — ED Notes (Signed)
Pt cleansed of urinary incontinence. External male catheter put in place. Pt clean and dry at this time.

## 2022-04-09 NOTE — ED Triage Notes (Signed)
Pt presents via EMS from the [facility] for [place] following a cancer treatment today. Staff unsure what cancer treatment he received but had increased weakness and confusion following that. Ptg was febrile with EMS 101.5 and was given Tylenol PTA. Pt A&Ox4. Denies CP or SOB.

## 2022-04-09 NOTE — Progress Notes (Signed)
cbc

## 2022-04-09 NOTE — ED Provider Notes (Signed)
[facility] Provider Note    Event Date/Time   First MD Initiated Contact with Patient 04/09/22 2144     (approximate)   History   No chief complaint on file.   HPI  Alex Smith is a 87 y.o. 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  Patient got a chemotherapy treatment today and when he went back to the facility he was not acting his normal self.  He denies any falls or hitting his head.  He states that he does not feel confused and is currently alert and oriented x3 but does states that he feels weak.  Patient was noted to have a temperature of 101.5 and was given Tylenol at [facility] before being sent over.  He denies any cough shortness of breath abdominal pain dysuria.  He does not have any catheters in place.   Physical Exam   Triage Vital Signs: Blood pressure (!) 202/89, pulse (!) 108, temperature 99.9 F (37.7 C), temperature source Oral, resp. rate 16, height 5' 5"  (1.651 m), weight 93.5 kg, SpO2 98 %.  Most recent vital signs: There were no vitals filed for this visit.   General: Awake, no distress.  CV:  Good peripheral perfusion.  Resp:  Normal effort.  Abd:  No distention.  Other:  Patient is alert and oriented x3 abdominal tachycardic abdomen seems reassuring.   ED Results / Procedures / Treatments   Labs (all labs ordered are listed, but only abnormal results are displayed) Labs Reviewed  COMPREHENSIVE METABOLIC PANEL - Abnormal; Notable for the following components:      Result Value   Glucose, Bld 192 (*)    BUN 26 (*)    Creatinine, Ser 1.30 (*)    Calcium 8.6 (*)    GFR, Estimated 54 (*)    All other components within normal limits  CBC WITH DIFFERENTIAL/PLATELET - Abnormal; Notable for the following components:   WBC 3.7 (*)    RBC 3.14 (*)    Hemoglobin 10.5 (*)    HCT 32.1 (*)    MCV 102.2 (*)     Platelets 103 (*)    All other components within normal limits  RESP PANEL BY RT-PCR (FLU A&B, COVID) ARPGX2  CULTURE, BLOOD (ROUTINE X 2)  CULTURE, BLOOD (ROUTINE X 2)  URINE CULTURE  LACTIC ACID, PLASMA  PROTIME-INR  APTT  PROCALCITONIN  LACTIC ACID, PLASMA  URINALYSIS, COMPLETE (UACMP) WITH MICROSCOPIC  PROCALCITONIN  TROPONIN I (HIGH SENSITIVITY)  TROPONIN I (HIGH SENSITIVITY)     EKG  My interpretation of EKG:  Sinus tachycardia rate of 105 without any ST elevation or T wave inversions  RADIOLOGY I have reviewed the xray personally and interpreted no evidence of PNA  PROCEDURES:  Critical Care performed: Yes, see critical care procedure note(s)  .1-3 Lead EKG Interpretation  Performed by: Vanessa [place], MD Authorized by: Vanessa [place], MD     Interpretation: abnormal     ECG rate:  103   ECG rate assessment: tachycardic     Rhythm: sinus tachycardia     Ectopy: none     Conduction: normal   .Critical Care  Performed by: Vanessa Sterling, MD Authorized by: Vanessa [place], MD   Critical care provider statement:    Critical care time (minutes):  30   Critical care was necessary to treat or prevent imminent or  life-threatening deterioration of the following conditions:  Sepsis   Critical care was time spent personally by me on the following activities:  Development of treatment plan with patient or surrogate, discussions with consultants, evaluation of patient's response to treatment, examination of patient, ordering and review of laboratory studies, ordering and review of radiographic studies, ordering and performing treatments and interventions, pulse oximetry, re-evaluation of patient's condition and review of old charts    [facility] ED: Medications  vancomycin (VANCOREADY) IVPB 2000 mg/400 mL (2,000 mg Intravenous New Bag/Given 04/09/22 2319)  iohexol (OMNIPAQUE) 300 MG/ML solution 100 mL (has no administration in time range)  ceFEPIme (MAXIPIME) 2  g in sodium chloride 0.9 % 100 mL IVPB (0 g Intravenous Stopped 04/09/22 2317)  metroNIDAZOLE (FLAGYL) IVPB 500 mg (0 mg Intravenous Stopped 04/09/22 2317)  sodium chloride 0.9 % bolus 1,000 mL (0 mLs Intravenous Stopped 04/09/22 2317)     IMPRESSION / MDM / [place] / ED COURSE  I reviewed the triage vital signs and the nursing notes.   Patient's presentation is most consistent with acute presentation with potential threat to life or bodily function.   Patient comes in with weakness confusion and fevers.  Sepsis alert was called broad-spectrum antibiotics were started due to him being immunosuppressed.  Procalcitonin is negative and I will proceed with CT head to evaluate for any intracranial hemorrhage CT PE evaluate for PE given tachycardia and fever as well as CT abdomen to rule out any abdominal source.  Patient be handed off to oncoming team pending these results and further discussion.  Patient blood work otherwise is reassuring although he does have slightly low white count This CMP shows slightly elevated creatinine  The patient is on the cardiac monitor to evaluate for evidence of arrhythmia and/or significant heart rate changes.      FINAL CLINICAL IMPRESSION(S) / ED DIAGNOSES   Final diagnoses:  Altered mental status, unspecified altered mental status type  Sepsis, due to unspecified organism, unspecified whether acute organ dysfunction present [facility])     Rx / DC Orders   ED Discharge Orders     None        Note:  This document was prepared using Dragon voice recognition software and may include unintentional dictation errors.   Vanessa Deputy, MD 04/09/22 [phone]

## 2022-04-09 NOTE — ED Notes (Signed)
Patient transported to CT 

## 2022-04-10 ENCOUNTER — Other Ambulatory Visit: Payer: Self-pay

## 2022-04-10 ENCOUNTER — Inpatient Hospital Stay
Admission: EM | Admit: 2022-04-10 | Discharge: 2022-04-20 | DRG: 177 | Disposition: A | Discharge status: Skilled Nursing Facility | Payer: Medicare Other | Source: Skilled Nursing Facility | Attending: Internal Medicine | Admitting: Internal Medicine

## 2022-04-10 DIAGNOSIS — Z8249 Family history of ischemic heart disease and other diseases of the circulatory system: Secondary | ICD-10-CM

## 2022-04-10 DIAGNOSIS — Z833 Family history of diabetes mellitus: Secondary | ICD-10-CM

## 2022-04-10 DIAGNOSIS — E785 Hyperlipidemia, unspecified: Secondary | ICD-10-CM | POA: Diagnosis present

## 2022-04-10 DIAGNOSIS — N179 Acute kidney failure, unspecified: Secondary | ICD-10-CM | POA: Diagnosis present

## 2022-04-10 DIAGNOSIS — J9601 Acute respiratory failure with hypoxia: Secondary | ICD-10-CM | POA: Diagnosis present

## 2022-04-10 DIAGNOSIS — U071 COVID-19: Principal | ICD-10-CM

## 2022-04-10 DIAGNOSIS — Z66 Do not resuscitate: Secondary | ICD-10-CM | POA: Diagnosis present

## 2022-04-10 DIAGNOSIS — I129 Hypertensive chronic kidney disease with stage 1 through stage 4 chronic kidney disease, or unspecified chronic kidney disease: Secondary | ICD-10-CM | POA: Diagnosis present

## 2022-04-10 DIAGNOSIS — Z6834 Body mass index (BMI) 34.0-34.9, adult: Secondary | ICD-10-CM | POA: Diagnosis not present

## 2022-04-10 DIAGNOSIS — N400 Enlarged prostate without lower urinary tract symptoms: Secondary | ICD-10-CM | POA: Diagnosis present

## 2022-04-10 DIAGNOSIS — C9141 Hairy cell leukemia, in remission: Secondary | ICD-10-CM | POA: Diagnosis present

## 2022-04-10 DIAGNOSIS — N1832 Chronic kidney disease, stage 3b: Secondary | ICD-10-CM | POA: Diagnosis present

## 2022-04-10 DIAGNOSIS — Z85828 Personal history of other malignant neoplasm of skin: Secondary | ICD-10-CM

## 2022-04-10 DIAGNOSIS — D539 Nutritional anemia, unspecified: Secondary | ICD-10-CM | POA: Diagnosis present

## 2022-04-10 DIAGNOSIS — T380X5A Adverse effect of glucocorticoids and synthetic analogues, initial encounter: Secondary | ICD-10-CM | POA: Diagnosis present

## 2022-04-10 DIAGNOSIS — E1165 Type 2 diabetes mellitus with hyperglycemia: Secondary | ICD-10-CM | POA: Diagnosis present

## 2022-04-10 DIAGNOSIS — E1122 Type 2 diabetes mellitus with diabetic chronic kidney disease: Secondary | ICD-10-CM | POA: Diagnosis present

## 2022-04-10 DIAGNOSIS — D6959 Other secondary thrombocytopenia: Secondary | ICD-10-CM | POA: Diagnosis present

## 2022-04-10 DIAGNOSIS — F0280 Dementia in other diseases classified elsewhere without behavioral disturbance: Secondary | ICD-10-CM | POA: Diagnosis present

## 2022-04-10 DIAGNOSIS — E669 Obesity, unspecified: Secondary | ICD-10-CM | POA: Diagnosis present

## 2022-04-10 DIAGNOSIS — Z803 Family history of malignant neoplasm of breast: Secondary | ICD-10-CM | POA: Diagnosis not present

## 2022-04-10 DIAGNOSIS — D46Z Other myelodysplastic syndromes: Secondary | ICD-10-CM | POA: Diagnosis present

## 2022-04-10 DIAGNOSIS — R531 Weakness: Secondary | ICD-10-CM

## 2022-04-10 DIAGNOSIS — F0390 Unspecified dementia without behavioral disturbance: Secondary | ICD-10-CM | POA: Diagnosis not present

## 2022-04-10 DIAGNOSIS — K219 Gastro-esophageal reflux disease without esophagitis: Secondary | ICD-10-CM | POA: Diagnosis present

## 2022-04-10 DIAGNOSIS — G309 Alzheimer's disease, unspecified: Secondary | ICD-10-CM | POA: Diagnosis present

## 2022-04-10 DIAGNOSIS — C9000 Multiple myeloma not having achieved remission: Secondary | ICD-10-CM | POA: Diagnosis present

## 2022-04-10 DIAGNOSIS — I10 Essential (primary) hypertension: Secondary | ICD-10-CM | POA: Diagnosis present

## 2022-04-10 DIAGNOSIS — D469 Myelodysplastic syndrome, unspecified: Secondary | ICD-10-CM | POA: Diagnosis present

## 2022-04-10 LAB — CBC WITH DIFFERENTIAL/PLATELET
Abs Immature Granulocytes: 0.02 10*3/uL (ref 0.00–0.07)
Basophils Absolute: 0 10*3/uL (ref 0.0–0.1)
Basophils Relative: 0 %
Eosinophils Absolute: 0 10*3/uL (ref 0.0–0.5)
Eosinophils Relative: 0 %
HCT: 32.4 % — ABNORMAL LOW (ref 39.0–52.0)
Hemoglobin: 10.5 g/dL — ABNORMAL LOW (ref 13.0–17.0)
Immature Granulocytes: 1 %
Lymphocytes Relative: 18 %
Lymphs Abs: 0.7 10*3/uL (ref 0.7–4.0)
MCH: 34 pg (ref 26.0–34.0)
MCHC: 32.4 g/dL (ref 30.0–36.0)
MCV: 104.9 fL — ABNORMAL HIGH (ref 80.0–100.0)
Monocytes Absolute: 0.4 10*3/uL (ref 0.1–1.0)
Monocytes Relative: 11 %
Neutro Abs: 2.6 10*3/uL (ref 1.7–7.7)
Neutrophils Relative %: 70 %
Platelets: 92 10*3/uL — ABNORMAL LOW (ref 150–400)
RBC: 3.09 MIL/uL — ABNORMAL LOW (ref 4.22–5.81)
RDW: 15.2 % (ref 11.5–15.5)
WBC: 3.8 10*3/uL — ABNORMAL LOW (ref 4.0–10.5)
nRBC: 0 % (ref 0.0–0.2)

## 2022-04-10 LAB — URINALYSIS, COMPLETE (UACMP) WITH MICROSCOPIC
Bacteria, UA: NONE SEEN
Bilirubin Urine: NEGATIVE
Glucose, UA: NEGATIVE mg/dL
Ketones, ur: NEGATIVE mg/dL
Leukocytes,Ua: NEGATIVE
Nitrite: NEGATIVE
Protein, ur: 100 mg/dL — AB
Specific Gravity, Urine: 1.03 (ref 1.005–1.030)
Squamous Epithelial / HPF: NONE SEEN (ref 0–5)
pH: 5 (ref 5.0–8.0)

## 2022-04-10 LAB — BASIC METABOLIC PANEL
Anion gap: 3 — ABNORMAL LOW (ref 5–15)
BUN: 23 mg/dL (ref 8–23)
CO2: 22 mmol/L (ref 22–32)
Calcium: 8.8 mg/dL — ABNORMAL LOW (ref 8.9–10.3)
Chloride: 113 mmol/L — ABNORMAL HIGH (ref 98–111)
Creatinine, Ser: 1.31 mg/dL — ABNORMAL HIGH (ref 0.61–1.24)
GFR, Estimated: 53 mL/min — ABNORMAL LOW (ref >60)
Glucose, Bld: 167 mg/dL — ABNORMAL HIGH (ref 70–99)
Potassium: 4 mmol/L (ref 3.5–5.1)
Sodium: 138 mmol/L (ref 135–145)

## 2022-04-10 LAB — LACTIC ACID, PLASMA
Lactic Acid, Venous: 1.4 mmol/L (ref 0.5–1.9)
Lactic Acid, Venous: 1.1 mmol/L (ref 0.5–1.9)

## 2022-04-10 MED ORDER — PANTOPRAZOLE SODIUM 40 MG PO TBEC
40.0000 mg | DELAYED_RELEASE_TABLET | Freq: Every day | ORAL | Status: DC
  Administered 2022-04-11 – 2022-04-14 (×4): 40 mg via ORAL
  Filled 2022-04-10 (×4): qty 1

## 2022-04-10 MED ORDER — ONDANSETRON HCL 4 MG/2ML IJ SOLN
4.0000 mg | Freq: Once | INTRAMUSCULAR | Status: AC
  Administered 2022-04-10: 4 mg via INTRAVENOUS
  Filled 2022-04-10: qty 2

## 2022-04-10 MED ORDER — ACETAMINOPHEN 500 MG PO TABS
ORAL_TABLET | ORAL | Status: AC
  Administered 2022-04-10: 1000 mg via ORAL
  Filled 2022-04-10: qty 2

## 2022-04-10 MED ORDER — LACTATED RINGERS IV BOLUS
1000.0000 mL | Freq: Once | INTRAVENOUS | Status: AC
  Administered 2022-04-10: 1000 mL via INTRAVENOUS

## 2022-04-10 MED ORDER — SODIUM CHLORIDE 0.9 % IV SOLN
200.0000 mg | Freq: Once | INTRAVENOUS | Status: AC
  Administered 2022-04-11: 200 mg via INTRAVENOUS
  Filled 2022-04-10: qty 40

## 2022-04-10 MED ORDER — ONDANSETRON HCL 4 MG PO TABS: 4.0000 mg | ORAL_TABLET | Freq: Four times a day (QID) | ORAL | Status: DC | PRN

## 2022-04-10 MED ORDER — ACETAMINOPHEN 325 MG PO TABS
650.0000 mg | ORAL_TABLET | Freq: Four times a day (QID) | ORAL | Status: DC | PRN
  Administered 2022-04-11 – 2022-04-17 (×2): 650 mg via ORAL
  Filled 2022-04-10 (×2): qty 2

## 2022-04-10 MED ORDER — ONDANSETRON HCL 4 MG/2ML IJ SOLN: 4.0000 mg | Freq: Four times a day (QID) | INTRAMUSCULAR | Status: DC | PRN

## 2022-04-10 MED ORDER — DEXAMETHASONE 4 MG PO TABS
6.0000 mg | ORAL_TABLET | Freq: Every day | ORAL | Status: DC
Start: 2022-04-10 — End: 2022-04-13
  Administered 2022-04-11 – 2022-04-13 (×4): 6 mg via ORAL
  Filled 2022-04-10: qty 1
  Filled 2022-04-10 (×2): qty 2
  Filled 2022-04-10: qty 1

## 2022-04-10 MED ORDER — ENOXAPARIN SODIUM 60 MG/0.6ML IJ SOSY
0.5000 mg/kg | PREFILLED_SYRINGE | INTRAMUSCULAR | Status: DC
  Administered 2022-04-11 – 2022-04-20 (×10): 47.5 mg via SUBCUTANEOUS
  Filled 2022-04-10 (×10): qty 0.6

## 2022-04-10 MED ORDER — ALBUTEROL SULFATE HFA 108 (90 BASE) MCG/ACT IN AERS: 2.0000 | INHALATION_SPRAY | Freq: Four times a day (QID) | RESPIRATORY_TRACT | Status: DC | PRN

## 2022-04-10 MED ORDER — SODIUM CHLORIDE 0.9 % IV SOLN
100.0000 mg | Freq: Every day | INTRAVENOUS | Status: AC
  Administered 2022-04-12 – 2022-04-13 (×2): 100 mg via INTRAVENOUS
  Filled 2022-04-10 (×2): qty 100

## 2022-04-10 MED ORDER — ACETAMINOPHEN 500 MG PO TABS
1000.0000 mg | ORAL_TABLET | Freq: Once | ORAL | Status: AC
Start: 2022-04-10 — End: 2022-04-10

## 2022-04-10 NOTE — Discharge Instructions (Signed)
You have COVID-19.  The rest of your work-up including your urine sample and blood work were all reassuring.  You can take Tylenol and Motrin as needed for fever and body aches.  If you are developing shortness of breath or worsening confusion please return to the emergency department.  Please make sure you are staying hydrated.

## 2022-04-10 NOTE — ED Provider Notes (Signed)
[facility] Provider Note    Event Date/Time   First MD Initiated Contact with Patient 04/10/22 1547     (approximate)   History   Chief Complaint Fever   HPI Alex Smith is a 87 y.o. male, history of Alzheimer's dementia, stage III kidney disease, multiple myeloma (currently on chemotherapy), obesity, type 2 diabetes, GERD, hypertension, presents to the emergency department for evaluation of fever and weakness.  He states that his symptoms for started 2 days ago.  He was seen in the emergency department yesterday, where he was diagnosed with COVID-19.  He was sent here by [facility], who states that he has been falling repeatedly today and is barely able to stand.  Endorses some nausea as well.  Denies chest pain, shortness of breath, abdominal pain, flank pain, nausea, numbness/tingling upper or lower extremities, or vertigo.  History Limitations: No limitations.        Physical Exam  Triage Vital Signs: ED Triage Vitals  Enc Vitals Group     BP 04/10/22 1411 (!) 182/79     Pulse Rate 04/10/22 1411 (!) 107     Resp 04/10/22 1411 20     Temp 04/10/22 1411 (!) 103.1 F (39.5 C)     Temp Source 04/10/22 1411 Oral     SpO2 04/10/22 1411 95 %     Weight 04/10/22 1421 206 lb 2.1 oz (93.5 kg)     Height 04/10/22 1421 5' 5"  (1.651 m)     Head Circumference --      Peak Flow --      Pain Score 04/10/22 1421 5     Pain Loc --      Pain Edu? --      Excl. in [place]? --     Most recent vital signs: Vitals:   04/10/22 1411 04/10/22 1515  BP: (!) 182/79   Pulse: (!) 107   Resp: 20   Temp: (!) 103.1 F (39.5 C) 99.4 F (37.4 C)  SpO2: 95%     General: Awake, appears exhausted. Skin: Warm, dry. No rashes or lesions.  Eyes: PERRL. Conjunctivae normal.  CV: Good peripheral perfusion.  Resp: Normal effort.  Lung sounds are clear bilaterally in apices and bases. Abd: Soft, non-tender. No distention.  Neuro: At baseline. No gross neurological  deficits.  Musculoskeletal: Normal ROM of all extremities.  Focused Exam: Patient has significant difficulty ambulating due to generalized weakness.  Physical Exam    ED Results / Procedures / Treatments  Labs (all labs ordered are listed, but only abnormal results are displayed) Labs Reviewed  CBC WITH DIFFERENTIAL/PLATELET - Abnormal; Notable for the following components:      Result Value   WBC 3.8 (*)    RBC 3.09 (*)    Hemoglobin 10.5 (*)    HCT 32.4 (*)    MCV 104.9 (*)    Platelets 92 (*)    All other components within normal limits  BASIC METABOLIC PANEL - Abnormal; Notable for the following components:   Chloride 113 (*)    Glucose, Bld 167 (*)    Creatinine, Ser 1.31 (*)    Calcium 8.8 (*)    GFR, Estimated 53 (*)    Anion gap 3 (*)    All other components within normal limits  LACTIC ACID, PLASMA  LACTIC ACID, PLASMA    EKG Pending.   RADIOLOGY  ED Provider Interpretation: N/A.  PROCEDURES:  Critical Care performed: N/A.  Procedures  MEDICATIONS ORDERED IN ED: Medications  acetaminophen (TYLENOL) tablet 1,000 mg (1,000 mg Oral Given 04/10/22 1420)  lactated ringers bolus 1,000 mL (1,000 mLs Intravenous New Bag/Given 04/10/22 1747)  ondansetron (ZOFRAN) injection 4 mg (4 mg Intravenous Given 04/10/22 1748)     IMPRESSION / MDM / ASSESSMENT AND PLAN / ED COURSE  I reviewed the triage vital signs and the nursing notes.                              Differential diagnosis includes, but is not limited to, COVID-19, pneumonia, sepsis.  ED Course Patient appears exhausted, but clinically stable.  Tachycardic at 107 and hypertensive at 187/79, otherwise normal vitals.  SPO2 95% on room air.  Currently endorsing body aches and nausea.  Will initiate IV fluids, acetaminophen, and ondansetron.  WBC 3.8.  Mild anemia present at 10.5 hemoglobin.  BMP shows AKI with creatinine of 1.31, consistent with yesterday's values.  GFR 53.  No significant  electrolyte abnormalities.  Assessment/Plan Patient presents with confirmed diagnosis of COVID-19.  Previous septic work-up yesterday, which was ultimately negative, no evidence of pneumonia on his CT scan yesterday. However he does have worsening myalgias, profound weakness,and a history of multiple myeloma, currently on chemotherapy.  Presents today with fever and tachycardia.  Given his comorbidities, profound weakness, and multiple falls today, I think that he would benefit from admission at this time to monitor for possible developing sepsis.  Spoke with the on-call hospitalist, who agreed to admission.   Patient's presentation is most consistent with acute presentation with potential threat to life or bodily function.       FINAL CLINICAL IMPRESSION(S) / ED DIAGNOSES   Final diagnoses:  [id number]     Rx / DC Orders   ED Discharge Orders     None        Note:  This document was prepared using Dragon voice recognition software and may include unintentional dictation errors.   Teodoro Spray, [place] 04/10/22 1944    Naaman Plummer, MD 04/11/22 1110

## 2022-04-10 NOTE — ED Triage Notes (Signed)
First Nurse: Pt here via [facility] from the [facility] with covid. Pt was seen here yesterday and dx with covid. Family wants pt to be evaluated for same.    194/93 101.1 100 95% RA 138-cbg

## 2022-04-10 NOTE — ED Provider Notes (Signed)
Patient signed out to me pending Cts and COVID test.  87 year old male with history of hematologic malignancy presents with a fever at his facility and some confusion.  His lab work is reassuring here with negative procalcitonin no leukocytosis.   COVID test is notably positive.  CT angio negative for pulmonary embolism.  CT abdomen pelvis showing some haziness In the pancreas correlate clinically for pancreatitis.  Patient has no epigastric abdominal pain no suspicion for pancreatitis at this time.  Discussed findings of COVID with patient and her daughter.  Patient seems to be mentating normally without signs of sepsis think he is appropriate for discharge.   Rada Hay, MD 04/10/22 [phone]

## 2022-04-10 NOTE — ED Notes (Signed)
Pts daughter in room and agreeing to take pt back to [facility]. Attempted to call report and on hold for greater than 30 minutes. Charge RN made aware.

## 2022-04-10 NOTE — H&P (Addendum)
History and Physical    Alex Smith JXB:[id number] DOB: Dec 21, 1934 DOA: 04/10/2022  PCP: Orvis Brill, [facility]  Patient coming from: [facility] have personally briefly reviewed patient's old medical records in [place]  Chief Complaint: weakness and falls s/p recent dx of COVID 8/22  HPI: Alex Smith is a 87 y.o. 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 He was discharged home with supportive care, no antivirals were prescribed at that time.  Patient now returns to ed 12 hours later from ALF via EMS with increase weakness and falls for further evaluation.  Please note history mostly given by son. ON further ros patient noted no n/v/d/dysuria/ increase sob but notes cough, persistent fever/fatigue generalized malaise and confusion.  ED Course:  Vitals: temp 103.1 bp 182/79, hr 107, rr 16  Sat 88% Wbc 3.8, hgb 10.5,plt 92 Lactic 1.4 Na 138, K 4.0,glu 167, cr 1.3 base 1.1 Ce 10 EKG: snr pvc Procal < .10 Tx LR 1L, zofran Review of Systems: As per HPI otherwise 10 point review of systems negative.   Past Medical History:  Diagnosis Date   Alzheimer's dementia ([place]) 08/18/2021   Anemia    B12 deficiency    Basal cell carcinoma 03/30/2018   left ant nasal ala   Basal cell carcinoma of face 01/19/2013   right distal dorsum nose   BPH (benign prostatic hypertrophy)    followed by urology, discharged (Dr. 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 pretibial. 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 EDC 09/07/2020   Squamous cell carcinoma of skin 04/03/2020   Left cheek. WD SCC   Systolic murmur 13/03/6577   Type 2 diabetes, controlled, with retinopathy ([place])     Past Surgical History:  Procedure Laterality Date   25 GAUGE PARS PLANA VITRECTOMY WITH 20 GAUGE MVR PORT FOR MACULAR HOLE  07/07/2012   Procedure: 25 GAUGE PARS PLANA VITRECTOMY WITH 20 GAUGE MVR PORT FOR MACULAR HOLE;  Surgeon: Hayden Pedro, MD;  Location: [facility];  Service: Ophthalmology;  Laterality: Left;   BONE MARROW BIOPSY  2016   CARDIOVASCULAR STRESS TEST  2013   treadmill - no evidence ischemia, EF 61%   CATARACT EXTRACTION W/ INTRAOCULAR LENS  IMPLANT, BILATERAL  ~ 2010   COLONOSCOPY  2014   [place] WNL no rpt needed, h/o polyps   EYE SURGERY Left  06/2012   laser surgery   GAS INSERTION  07/07/2012   Procedure: INSERTION OF GAS;  Surgeon: Hayden Pedro, MD;  Location: [facility];  Service: Ophthalmology;  Laterality: Left;  [id number]   PERIPHERAL VASCULAR CATHETERIZATION N/A 02/23/2015   Procedure: Glori Luis Cath Insertion;  Surgeon: Algernon Huxley, MD;  Location: [facility];  Service: Cardiovascular;  Laterality: N/A;   PORTA CATH REMOVAL N/A 02/16/2020   Procedure: PORTA CATH REMOVAL;  Surgeon: Algernon Huxley, MD;  Location: [facility];  Service: Cardiovascular;  Laterality: N/A;   SERUM PATCH  07/07/2012   Procedure: SERUM PATCH;  Surgeon: Hayden Pedro,  MD;  Location: [facility];  Service: Ophthalmology;  Laterality: Left;   SKIN CANCER EXCISION     "all over my face" (07/07/2012)     reports that he quit smoking about 53 years ago. His smoking use included cigarettes. He has a 50.00 pack-year smoking history. He has never used smokeless tobacco. He reports that he does not currently use alcohol. He reports that he does not use drugs.  Allergies  Allergen Reactions   Rituximab Rash    Chest tightness Chest tightness   Blood-Group Specific Substance Other (See Comments)    Had a post transfusion reaction of red blood cells; NOW REQUIRES WASHED BLOOD CELLS Had a post transfusion reaction of red blood cells; NOW REQUIRES WASHED BLOOD CELLS   Primaxin [Imipenem] Other (See Comments)    Possible allergy Tolerates cephalosporins   Voriconazole Other (See Comments)   Sulfa Antibiotics Itching and Rash   Sulfacetamide Sodium Itching and Rash    Family History  Problem Relation Age of Onset   Dementia Mother    Heart failure Father 71   Cancer Sister        breast   Diabetes Paternal Uncle    Diabetes Paternal Aunt    CAD Brother 58       MI   Stroke Neg Hx     Prior to Admission medications   Medication Sig Start Date End Date Taking? Authorizing Provider  amLODipine (NORVASC) 5 MG tablet Take 1 tablet (5 mg total) by mouth daily. 05/12/21   Dessa Phi, DO  aspirin EC 81 MG tablet Take 81 mg by mouth daily.    [provider]  doxazosin (CARDURA) 1 MG tablet TAKE ONE TABLET BY MOUTH EVERY DAY 08/31/19   Ria Bush, MD  famotidine (PEPCID) 20 MG tablet Take 20 mg by mouth daily.    [provider]  feeding supplement (ENSURE ENLIVE / ENSURE PLUS) LIQD Take 237 mLs by mouth 2 (two) times daily between meals. 08/22/21   Antonieta Pert, MD  insulin aspart (NOVOLOG) 100 UNIT/ML injection Inject into the skin 3 (three) times daily before meals. As needed on sliding scale121-150 gets 1 unit, 151 - 200  gets 2 units ,  251-300-3 units, 301  to 350  gets 7 units , 9 units, over 400 call md    [provider]  insulin glargine (LANTUS) 100 UNIT/ML Solostar Pen Inject 15 Units into the skin at bedtime.    [provider]  lisinopril (ZESTRIL) 30 MG tablet Take 30 mg by mouth daily.    [provider]  Multiple Vitamin (MULTIVITAMIN WITH MINERALS) TABS tablet Take 1 tablet by mouth daily. 08/23/21   Antonieta Pert, MD  NON FORMULARY Apply 1 application topically as needed. AMITIP5% / GABA10% / LIDO5% Apply topically to arms, abdomen and legs as needed. *  Provided by patient's family* 06/28/21   [provider]  NON FORMULARY Apply 1 Dose topically in the morning and at bedtime. AMITIPS5%/Gaba10%/LIDO5% to arms, legs and abdomen    [provider]  PAIN RELIEF EXTRA STRENGTH 500 MG tablet Take 1,000 mg by mouth in the morning and at bedtime. Tylenol 10/16/21   [provider]  pravastatin (PRAVACHOL) 20 MG tablet TAKE ONE TABLET EVERY EVENING 03/16/20   Ria Bush, MD  sitaGLIPtin (JANUVIA) 50 MG tablet Take 50 mg by mouth daily.    [provider]  tamsulosin (FLOMAX) 0.4 MG CAPS capsule TAKE 1 CAPSULE BY MOUTH EVERY DAY 12/11/17   Ria Bush, MD  traMADol (ULTRAM) 50 MG tablet Take 1 tablet (50 mg total) by mouth every 6 (six) hours as needed for up to 4 doses for moderate pain. 08/22/21   Antonieta Pert, MD  vitamin B-12 (CYANOCOBALAMIN) 1000 MCG tablet Take 1 tablet (1,000 mcg total) by mouth daily. 06/01/19   Ria Bush, MD  vitamin E 400 UNIT capsule Take 400 Units by mouth daily.    [provider]    Physical Exam: Vitals:   04/10/22 1411 04/10/22 1421 04/10/22 1515 04/10/22 1950  BP: (!) 182/79   (!) 204/94  Pulse: (!) 107   (!) 103  Resp: 20     Temp: (!) 103.1 F (39.5 C)  99.4 F (37.4 C)   TempSrc: Oral  Oral   SpO2: 95%   (!) 88%  Weight:  93.5 kg    Height:  5' 5"  (1.651 m)      Vitals:   04/10/22 1411 04/10/22  1421 04/10/22 1515 04/10/22 1950  BP: (!) 182/79   (!) 204/94  Pulse: (!) 107   (!) 103  Resp: 20     Temp: (!) 103.1 F (39.5 C)  99.4 F (37.4 C)   TempSrc: Oral  Oral   SpO2: 95%   (!) 88%  Weight:  93.5 kg    Height:  5' 5"  (1.651 m)     Constitutional: NAD, calm, comfortable Eyes: PERRL, lids and conjunctivae normal ENMT: Mucous membranes are moist. Posterior pharynx clear of any exudate or lesions.Normal dentition.  Neck: normal, supple, no masses, no thyromegaly Respiratory: clear to auscultation bilaterally, no wheezing, no crackles. Normal respiratory effort. No accessory muscle use.  Cardiovascular: Regular rate and rhythm, + murmurs / rubs / gallops. No extremity edema. 2+ pedal pulses. No carotid bruits.  Abdomen: no tenderness, no masses palpated. No hepatosplenomegaly. Bowel sounds positive.  Musculoskeletal: no clubbing / cyanosis. No joint deformity upper and lower extremities. Good ROM, no contractures. Normal muscle tone.  Skin: no rashes, lesions, ulcers. No induration Neurologic: CN 2-12 grossly intact. Sensation intact,  Strength 5/5 in all 4.  Psychiatric: alert to self and place, appears somewhat confused more than baseline per son  Labs on Admission: I have personally reviewed following labs and imaging studies  CBC: Recent Labs  Lab 04/09/22 0936 04/09/22 2150 04/10/22 1735  WBC 3.6* 3.7* 3.8*  NEUTROABS 2.7 2.7 2.6  HGB 10.2* 10.5* 10.5*  HCT 30.8* 32.1* 32.4*  MCV 103.4* 102.2* 104.9*  PLT 100* 103* 92*   Basic Metabolic Panel: Recent Labs  Lab 04/09/22 0936 04/09/22 2150 04/10/22 1735  NA 134* 136 138  K 4.3 4.1 4.0  CL 108 108 113*  CO2 22 22 22   GLUCOSE 320* 192* 167*  BUN 29* 26* 23  CREATININE 1.26* 1.30* 1.31*  CALCIUM 8.2* 8.6* 8.8*  GFR: Estimated Creatinine Clearance: 43.3 mL/min (A) (by C-G formula based on SCr of 1.31 mg/dL (H)). Liver Function Tests: Recent Labs  Lab 04/09/22 0936 04/09/22 2150  AST 14* 15  ALT 11  11  ALKPHOS 71 73  BILITOT 0.6 0.9  PROT 7.1 7.3  ALBUMIN 3.7 3.8   No results for input(s): "LIPASE", "AMYLASE" in the last 168 hours. No results for input(s): "AMMONIA" in the last 168 hours. Coagulation Profile: Recent Labs  Lab 04/09/22 2150  INR 1.0   Cardiac Enzymes: No results for input(s): "CKTOTAL", "CKMB", "CKMBINDEX", "TROPONINI" in the last 168 hours. BNP (last 3 results) No results for input(s): "PROBNP" in the last 8760 hours. HbA1C: No results for input(s): "HGBA1C" in the last 72 hours. CBG: No results for input(s): "GLUCAP" in the last 168 hours. Lipid Profile: No results for input(s): "CHOL", "HDL", "LDLCALC", "TRIG", "CHOLHDL", "LDLDIRECT" in the last 72 hours. Thyroid Function Tests: No results for input(s): "TSH", "T4TOTAL", "FREET4", "T3FREE", "THYROIDAB" in the last 72 hours. Anemia Panel: No results for input(s): "VITAMINB12", "FOLATE", "FERRITIN", "TIBC", "IRON", "RETICCTPCT" in the last 72 hours. Urine analysis:    Component Value Date/Time   COLORURINE STRAW (A) 04/09/2022 2150   APPEARANCEUR CLEAR (A) 04/09/2022 2150   LABSPEC 1.030 04/09/2022 2150   PHURINE 5.0 04/09/2022 2150   GLUCOSEU NEGATIVE 04/09/2022 2150   HGBUR SMALL (A) 04/09/2022 2150   BILIRUBINUR NEGATIVE 04/09/2022 2150   BILIRUBINUR Negative 08/02/2016 1212   KETONESUR NEGATIVE 04/09/2022 2150   PROTEINUR 100 (A) 04/09/2022 2150   UROBILINOGEN 0.2 08/02/2016 1212   NITRITE NEGATIVE 04/09/2022 2150   LEUKOCYTESUR NEGATIVE 04/09/2022 2150    Radiological Exams on Admission: CT ABDOMEN PELVIS W CONTRAST  Result Date: 04/10/2022 CLINICAL DATA:  Concern for present.  Abdominal pain. EXAM: CT ANGIOGRAPHY CHEST CT ABDOMEN AND PELVIS WITH CONTRAST TECHNIQUE: Multidetector CT imaging of the chest was performed using the standard protocol during bolus administration of intravenous contrast. Multiplanar CT image reconstructions and MIPs were obtained to evaluate the vascular anatomy.  Multidetector CT imaging of the abdomen and pelvis was performed using the standard protocol during bolus administration of intravenous contrast. RADIATION DOSE REDUCTION: This exam was performed according to the departmental dose-optimization program which includes automated exposure control, adjustment of the mA and/or kV according to patient size and/or use of iterative reconstruction technique. CONTRAST:  181m OMNIPAQUE IOHEXOL 350 MG/ML SOLN COMPARISON:  CT of the chest abdomen pelvis dated 06/01/2007. FINDINGS: Evaluation of this exam is limited due to respiratory motion artifact. CTA CHEST FINDINGS Cardiovascular: There is no cardiomegaly. Trace pericardial effusion anterior to the heart. There is coronary vascular calcification. Mild atherosclerotic calcification of the thoracic aorta. No aneurysmal dilatation or dissection. The origins of the great vessels of the aortic arch appear patent. Evaluation of the pulmonary arteries is limited due to respiratory motion. No central pulmonary artery embolus identified. Mediastinum/Nodes: No hilar or mediastinal adenopathy. There is a small hiatal hernia. The esophagus is grossly unremarkable. No mediastinal fluid collection. Lungs/Pleura: Bibasilar linear atelectasis/scarring. No focal consolidation, pleural effusion, pneumothorax. Right upper lobe calcified granuloma. The central airways are patent. Musculoskeletal: Degenerative changes of the spine. Age indeterminate compression fracture of superior endplate of T3 with approximately 20% loss of vertebral body height. Correlation with clinical exam and point tenderness recommended. Review of the MIP images confirms the above findings. CT ABDOMEN and PELVIS FINDINGS No intra-abdominal free air or free fluid. Hepatobiliary: The liver is unremarkable. No biliary dilatation. Multiple gallstones. No pericholecystic fluid  or evidence of acute cholecystitis by CT. Pancreas: Mild haziness of the fat adjacent to the distal  body and tail of the pancreas. Correlation with pancreatic enzymes recommended to exclude acute pancreatitis. No dilatation of the main pancreatic duct. No fluid collection. Spleen: Multiple small scattered calcified splenic granuloma. Adrenals/Urinary Tract: The adrenal glands unremarkable. There is a 3.5 cm left renal posterior interpolar exophytic cyst as well as several additional subcentimeter hypodense lesions which are too small to characterize. There is no hydronephrosis on either side. There is symmetric enhancement and excretion of contrast by both kidneys. The visualized ureters and urinary bladder appear unremarkable. Stomach/Bowel: There is severe sigmoid diverticulosis without active inflammatory changes. Moderate stool throughout the colon. There is no bowel obstruction or active inflammation. Normal appendix. Vascular/Lymphatic: Moderate aortoiliac atherosclerotic disease. The IVC is unremarkable. No portal venous gas. There is no adenopathy. Reproductive: The prostate and seminal vesicles are grossly unremarkable no pelvic mass. Other: None Musculoskeletal: Osteopenia with degenerative changes of the spine. Age indeterminate L2 compression fracture with approximately 50% loss of vertebral body height and anterior wedging. Correlation with clinical exam and point tenderness recommended. No retropulsion. Review of the MIP images confirms the above findings. IMPRESSION: 1. No acute intrathoracic pathology. No CT evidence of central pulmonary artery embolus. 2. Mild haziness of the fat adjacent to the distal body and tail of the pancreas. Correlation with pancreatic enzymes recommended to exclude acute pancreatitis. 3. Cholelithiasis. 4. Severe sigmoid diverticulosis without active inflammatory changes. No bowel obstruction. Normal appendix. 5. Age indeterminate T3 and L2 compression fractures. Correlation with clinical exam and point tenderness recommended. 6. Aortic Atherosclerosis (ICD10-I70.0).  Electronically Signed   By: Anner Crete M.D.   On: 04/10/2022 00:13   CT Angio Chest PE W and/or Wo Contrast  Result Date: 04/10/2022 CLINICAL DATA:  Concern for present.  Abdominal pain. EXAM: CT ANGIOGRAPHY CHEST CT ABDOMEN AND PELVIS WITH CONTRAST TECHNIQUE: Multidetector CT imaging of the chest was performed using the standard protocol during bolus administration of intravenous contrast. Multiplanar CT image reconstructions and MIPs were obtained to evaluate the vascular anatomy. Multidetector CT imaging of the abdomen and pelvis was performed using the standard protocol during bolus administration of intravenous contrast. RADIATION DOSE REDUCTION: This exam was performed according to the departmental dose-optimization program which includes automated exposure control, adjustment of the mA and/or kV according to patient size and/or use of iterative reconstruction technique. CONTRAST:  166m OMNIPAQUE IOHEXOL 350 MG/ML SOLN COMPARISON:  CT of the chest abdomen pelvis dated 06/01/2007. FINDINGS: Evaluation of this exam is limited due to respiratory motion artifact. CTA CHEST FINDINGS Cardiovascular: There is no cardiomegaly. Trace pericardial effusion anterior to the heart. There is coronary vascular calcification. Mild atherosclerotic calcification of the thoracic aorta. No aneurysmal dilatation or dissection. The origins of the great vessels of the aortic arch appear patent. Evaluation of the pulmonary arteries is limited due to respiratory motion. No central pulmonary artery embolus identified. Mediastinum/Nodes: No hilar or mediastinal adenopathy. There is a small hiatal hernia. The esophagus is grossly unremarkable. No mediastinal fluid collection. Lungs/Pleura: Bibasilar linear atelectasis/scarring. No focal consolidation, pleural effusion, pneumothorax. Right upper lobe calcified granuloma. The central airways are patent. Musculoskeletal: Degenerative changes of the spine. Age indeterminate  compression fracture of superior endplate of T3 with approximately 20% loss of vertebral body height. Correlation with clinical exam and point tenderness recommended. Review of the MIP images confirms the above findings. CT ABDOMEN and PELVIS FINDINGS No intra-abdominal free air or free fluid. Hepatobiliary:  The liver is unremarkable. No biliary dilatation. Multiple gallstones. No pericholecystic fluid or evidence of acute cholecystitis by CT. Pancreas: Mild haziness of the fat adjacent to the distal body and tail of the pancreas. Correlation with pancreatic enzymes recommended to exclude acute pancreatitis. No dilatation of the main pancreatic duct. No fluid collection. Spleen: Multiple small scattered calcified splenic granuloma. Adrenals/Urinary Tract: The adrenal glands unremarkable. There is a 3.5 cm left renal posterior interpolar exophytic cyst as well as several additional subcentimeter hypodense lesions which are too small to characterize. There is no hydronephrosis on either side. There is symmetric enhancement and excretion of contrast by both kidneys. The visualized ureters and urinary bladder appear unremarkable. Stomach/Bowel: There is severe sigmoid diverticulosis without active inflammatory changes. Moderate stool throughout the colon. There is no bowel obstruction or active inflammation. Normal appendix. Vascular/Lymphatic: Moderate aortoiliac atherosclerotic disease. The IVC is unremarkable. No portal venous gas. There is no adenopathy. Reproductive: The prostate and seminal vesicles are grossly unremarkable no pelvic mass. Other: None Musculoskeletal: Osteopenia with degenerative changes of the spine. Age indeterminate L2 compression fracture with approximately 50% loss of vertebral body height and anterior wedging. Correlation with clinical exam and point tenderness recommended. No retropulsion. Review of the MIP images confirms the above findings. IMPRESSION: 1. No acute intrathoracic pathology.  No CT evidence of central pulmonary artery embolus. 2. Mild haziness of the fat adjacent to the distal body and tail of the pancreas. Correlation with pancreatic enzymes recommended to exclude acute pancreatitis. 3. Cholelithiasis. 4. Severe sigmoid diverticulosis without active inflammatory changes. No bowel obstruction. Normal appendix. 5. Age indeterminate T3 and L2 compression fractures. Correlation with clinical exam and point tenderness recommended. 6. Aortic Atherosclerosis (ICD10-I70.0). Electronically Signed   By: Anner Crete M.D.   On: 04/10/2022 00:13   CT HEAD WO CONTRAST (5MM)  Result Date: 04/10/2022 CLINICAL DATA:  Trauma. EXAM: CT HEAD WITHOUT CONTRAST TECHNIQUE: Contiguous axial images were obtained from the base of the skull through the vertex without intravenous contrast. RADIATION DOSE REDUCTION: This exam was performed according to the departmental dose-optimization program which includes automated exposure control, adjustment of the mA and/or kV according to patient size and/or use of iterative reconstruction technique. COMPARISON:  Head CT dated 08/18/2021. FINDINGS: Brain: Mild age-related atrophy and chronic microvascular ischemic changes. There is no acute intracranial hemorrhage. No mass effect or midline shift. No extra-axial fluid collection. Vascular: No hyperdense vessel or unexpected calcification. Skull: Normal. Negative for fracture or focal lesion. Sinuses/Orbits: There is mild diffuse mucoperiosteal thickening of paranasal sinuses. No air-fluid level. The mastoid air cells clear. Other: None IMPRESSION: 1. No acute intracranial pathology. 2. Mild age-related atrophy and chronic microvascular ischemic changes. Electronically Signed   By: Anner Crete M.D.   On: 04/10/2022 00:03   DG Chest Port 1 View  Result Date: 04/09/2022 CLINICAL DATA:  Questionable sepsis - evaluate for abnormality EXAM: PORTABLE CHEST 1 VIEW COMPARISON:  08/18/2021 FINDINGS: Prominent right  cardiophrenic angle fat pad. No confluent opacities or effusions. Heart is normal size. No acute bony abnormality. IMPRESSION: No active disease. Electronically Signed   By: Rolm Baptise M.D.   On: 04/09/2022 22:17    EKG: Independently reviewed.   Assessment/Plan COVID viral Pneumonia with acute hypoxic respiratory failure  -sat 88% on ra  -start decadron 44m po daily x 10 days -start remdesivir per protoco x 5 days --wean O2 as able  -pulmonary toilet per protocol  -encourage po fluid intake  -daily monitoring of COVID labs  -  CTPA less than 24 hours ago neg  -admission crp/d-dimer pending  -monitor on continuous pulse ox    Alzheimer's dementia -continue aricept   CKDIIIa -at baseline    Leukemia -last received cladribine for this July 2016 -in remission  -followed by oncology   Multiple Myeloma  -previously tx with Revlimid and Decadron in the past in 2017  -stable   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

## 2022-04-10 NOTE — Progress Notes (Signed)
Sepsis tracking by eLINK 

## 2022-04-10 NOTE — ED Notes (Signed)
PT covered in stool and urine. Pt brief changed. Lactic acid obtained and sent

## 2022-04-10 NOTE — ED Notes (Signed)
Pt placed on 4L [place] for o2 sat

## 2022-04-10 NOTE — ED Notes (Signed)
REPORT PROVIDED TO [facility] BY WRITER TO [facility] AND REPORT THAT DAUGHTER WOULD BE TRANSPORTING PT BACK TO FACILITY.

## 2022-04-10 NOTE — Progress Notes (Signed)
PHARMACIST - PHYSICIAN COMMUNICATION  CONCERNING:  Enoxaparin (Lovenox) for DVT Prophylaxis    RECOMMENDATION: Patient was prescribed enoxaprin '40mg'$  q24 hours for VTE prophylaxis.   Filed Weights   04/10/22 1421  Weight: 93.5 kg (206 lb 2.1 oz)    Body mass index is 34.3 kg/m.  Estimated Creatinine Clearance: 43.3 mL/min (A) (by C-G formula based on SCr of 1.31 mg/dL (H)).   Based on [place] patient is candidate for enoxaparin 0.'5mg'$ /kg TBW SQ every 24 hours based on BMI being >30.  DESCRIPTION: Pharmacy has adjusted enoxaparin dose per [facility] policy.  Patient is now receiving enoxaparin 0.5 mg/kg every 24 hours   Renda Rolls, PharmD, [facility] 04/10/2022 11:35 PM

## 2022-04-11 ENCOUNTER — Encounter: Payer: Self-pay | Admitting: Internal Medicine

## 2022-04-11 DIAGNOSIS — D469 Myelodysplastic syndrome, unspecified: Secondary | ICD-10-CM | POA: Diagnosis not present

## 2022-04-11 DIAGNOSIS — J9601 Acute respiratory failure with hypoxia: Secondary | ICD-10-CM | POA: Diagnosis not present

## 2022-04-11 DIAGNOSIS — U071 COVID-19: Secondary | ICD-10-CM | POA: Diagnosis not present

## 2022-04-11 LAB — CBC WITH DIFFERENTIAL/PLATELET
Abs Immature Granulocytes: 0.01 10*3/uL (ref 0.00–0.07)
Basophils Absolute: 0 10*3/uL (ref 0.0–0.1)
Basophils Relative: 1 %
Eosinophils Absolute: 0 10*3/uL (ref 0.0–0.5)
Eosinophils Relative: 0 %
HCT: 32.4 % — ABNORMAL LOW (ref 39.0–52.0)
Hemoglobin: 10.6 g/dL — ABNORMAL LOW (ref 13.0–17.0)
Immature Granulocytes: 0 %
Lymphocytes Relative: 15 %
Lymphs Abs: 0.6 10*3/uL — ABNORMAL LOW (ref 0.7–4.0)
MCH: 33.7 pg (ref 26.0–34.0)
MCHC: 32.7 g/dL (ref 30.0–36.0)
MCV: 102.9 fL — ABNORMAL HIGH (ref 80.0–100.0)
Monocytes Absolute: 0.3 10*3/uL (ref 0.1–1.0)
Monocytes Relative: 7 %
Neutro Abs: 3.1 10*3/uL (ref 1.7–7.7)
Neutrophils Relative %: 77 %
Platelets: 96 10*3/uL — ABNORMAL LOW (ref 150–400)
RBC: 3.15 MIL/uL — ABNORMAL LOW (ref 4.22–5.81)
RDW: 14.8 % (ref 11.5–15.5)
WBC: 4 10*3/uL (ref 4.0–10.5)
nRBC: 0 % (ref 0.0–0.2)

## 2022-04-11 LAB — COMPREHENSIVE METABOLIC PANEL
ALT: 14 U/L (ref 0–44)
AST: 27 U/L (ref 15–41)
Albumin: 3.4 g/dL — ABNORMAL LOW (ref 3.5–5.0)
Alkaline Phosphatase: 68 U/L (ref 38–126)
Anion gap: 6 (ref 5–15)
BUN: 22 mg/dL (ref 8–23)
CO2: 23 mmol/L (ref 22–32)
Calcium: 8.6 mg/dL — ABNORMAL LOW (ref 8.9–10.3)
Chloride: 110 mmol/L (ref 98–111)
Creatinine, Ser: 1.2 mg/dL (ref 0.61–1.24)
GFR, Estimated: 59 mL/min — ABNORMAL LOW (ref >60)
Glucose, Bld: 227 mg/dL — ABNORMAL HIGH (ref 70–99)
Potassium: 4 mmol/L (ref 3.5–5.1)
Sodium: 139 mmol/L (ref 135–145)
Total Bilirubin: 0.9 mg/dL (ref 0.3–1.2)
Total Protein: 6.9 g/dL (ref 6.5–8.1)

## 2022-04-11 LAB — PROCALCITONIN: Procalcitonin: 0.27 ng/mL

## 2022-04-11 LAB — D-DIMER, QUANTITATIVE
D-Dimer, Quant: 1.39 ug/mL-FEU — ABNORMAL HIGH (ref 0.00–0.50)
D-Dimer, Quant: 1.77 ug/mL-FEU — ABNORMAL HIGH (ref 0.00–0.50)

## 2022-04-11 LAB — URINE CULTURE: Culture: NO GROWTH

## 2022-04-11 LAB — C-REACTIVE PROTEIN
CRP: 9.9 mg/dL — ABNORMAL HIGH (ref <1.0)
CRP: 13.9 mg/dL — ABNORMAL HIGH (ref <1.0)

## 2022-04-11 LAB — CBG MONITORING, ED
Glucose-Capillary: 165 mg/dL — ABNORMAL HIGH (ref 70–99)
Glucose-Capillary: 311 mg/dL — ABNORMAL HIGH (ref 70–99)

## 2022-04-11 LAB — GLUCOSE, CAPILLARY
Glucose-Capillary: 351 mg/dL — ABNORMAL HIGH (ref 70–99)
Glucose-Capillary: 312 mg/dL — ABNORMAL HIGH (ref 70–99)

## 2022-04-11 LAB — FERRITIN
Ferritin: 186 ng/mL (ref 24–336)
Ferritin: 204 ng/mL (ref 24–336)

## 2022-04-11 LAB — TROPONIN I (HIGH SENSITIVITY)
Troponin I (High Sensitivity): 25 ng/L — ABNORMAL HIGH (ref <18)
Troponin I (High Sensitivity): 27 ng/L — ABNORMAL HIGH (ref <18)

## 2022-04-11 LAB — FIBRINOGEN: Fibrinogen: 561 mg/dL — ABNORMAL HIGH (ref 210–475)

## 2022-04-11 LAB — BRAIN NATRIURETIC PEPTIDE: B Natriuretic Peptide: 100.7 pg/mL — ABNORMAL HIGH (ref 0.0–100.0)

## 2022-04-11 MED ORDER — AMLODIPINE BESYLATE 5 MG PO TABS
5.0000 mg | ORAL_TABLET | Freq: Every day | ORAL | Status: DC
Start: 2022-04-11 — End: 2022-04-13
  Administered 2022-04-11 – 2022-04-13 (×3): 5 mg via ORAL
  Filled 2022-04-11 (×3): qty 1

## 2022-04-11 MED ORDER — DOXAZOSIN MESYLATE 1 MG PO TABS
1.0000 mg | ORAL_TABLET | Freq: Every day | ORAL | Status: DC
  Administered 2022-04-11 – 2022-04-20 (×10): 1 mg via ORAL
  Filled 2022-04-11 (×10): qty 1

## 2022-04-11 MED ORDER — INSULIN ASPART 100 UNIT/ML IJ SOLN
0.0000 [IU] | Freq: Three times a day (TID) | INTRAMUSCULAR | Status: DC
  Administered 2022-04-11: 11 [IU] via SUBCUTANEOUS
  Administered 2022-04-11: 15 [IU] via SUBCUTANEOUS
  Administered 2022-04-12: 11 [IU] via SUBCUTANEOUS
  Administered 2022-04-12 (×2): 15 [IU] via SUBCUTANEOUS
  Administered 2022-04-13: 5 [IU] via SUBCUTANEOUS
  Administered 2022-04-13: 15 [IU] via SUBCUTANEOUS
  Filled 2022-04-11 (×7): qty 1

## 2022-04-11 MED ORDER — LISINOPRIL 20 MG PO TABS
30.0000 mg | ORAL_TABLET | Freq: Every day | ORAL | Status: DC
  Administered 2022-04-11 – 2022-04-12 (×2): 30 mg via ORAL
  Filled 2022-04-11: qty 3
  Filled 2022-04-11: qty 1

## 2022-04-11 MED ORDER — HYDRALAZINE HCL 20 MG/ML IJ SOLN
20.0000 mg | Freq: Four times a day (QID) | INTRAMUSCULAR | Status: DC | PRN
  Administered 2022-04-12 – 2022-04-13 (×4): 20 mg via INTRAVENOUS
  Filled 2022-04-11 (×4): qty 1

## 2022-04-11 MED ORDER — CALCIUM CARBONATE ANTACID 500 MG PO CHEW
400.0000 mg | CHEWABLE_TABLET | Freq: Two times a day (BID) | ORAL | Status: DC
  Administered 2022-04-11 – 2022-04-16 (×11): 400 mg via ORAL
  Filled 2022-04-11 (×11): qty 2

## 2022-04-11 MED ORDER — INSULIN GLARGINE-YFGN 100 UNIT/ML ~~LOC~~ SOLN
10.0000 [IU] | Freq: Every day | SUBCUTANEOUS | Status: DC
  Administered 2022-04-11 – 2022-04-17 (×7): 10 [IU] via SUBCUTANEOUS
  Filled 2022-04-11 (×8): qty 0.1

## 2022-04-11 NOTE — Progress Notes (Signed)
PROGRESS NOTE    Alex Smith  BTD:[id number] DOB: 1935/05/04 DOA: 04/10/2022 PCP: Orvis Brill, [facility]  Assessment & Plan:   Principal Problem:   COVID-19  Assessment and Plan: COVID19: no pneumonia seen on CXR of CTA chest. CTA ches neg for PE. Continue on remdsivir, steroids, bronchodilators & encourage incentive spirometry   Acute hypoxic respiratory failure: secondary to above. 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 Likely etiology of macrocytic anemia & thrombocytopenia   Multiple myeloma: last received treatment in 2017  HTN: continue on home dose of amlodipine, lisinopril, cardura  DM2: likely poorly controlled. Continue on SSI w/ accuchecks  GERD: continue on PPI   Thrombocytopenia: likely secondary to MDS. Will continue to monitor   Obesity: BMI 34.3. Complicates overall care & prognosis  DVT prophylaxis: lovenox  Code Status: full  Family Communication: discussed pt's care w/ pt's family at bedside and answered their questions  Disposition Plan: depends on PT/OT recs   Level of care: Telemetry Medical  Status is: Inpatient Remains inpatient appropriate because: severity of illnesss   Consultants:    Procedures:   Antimicrobials:    Subjective: Pt c/o malaise   Objective: Vitals:   04/11/22 0339 04/11/22 0530 04/11/22 0700 04/11/22 0730  BP:  136/69 (!) 180/83 (!) 172/83  Pulse:  97 (!) 103 (!) 102  Resp:  18    Temp: 99.2 F (37.3 C)     TempSrc: Oral     SpO2:  100% 99% 98%  Weight:      Height:       No intake or output data in the 24 hours ending 04/11/22 0800 Filed Weights   04/10/22 1421  Weight: 93.5 kg    Examination:  General exam: Appears calm but uncomfortable  Respiratory system: diminished breath  sounds b/l  Cardiovascular system: S1 & S2 heard, RRR. No rubs, gallops or clicks. Gastrointestinal system: Abdomen is obese, soft and nontender.  Normal bowel sounds heard. Central nervous system: Alert and oriented. Moves all extremities  Psychiatry: Judgement and insight appear normal. Flat mood and affect    Data Reviewed: I have personally reviewed following labs and imaging studies  CBC: Recent Labs  Lab 04/09/22 0936 04/09/22 2150 04/10/22 1735 04/11/22 0550  WBC 3.6* 3.7* 3.8* 4.0  NEUTROABS 2.7 2.7 2.6 3.1  HGB 10.2* 10.5* 10.5* 10.6*  HCT 30.8* 32.1* 32.4* 32.4*  MCV 103.4* 102.2* 104.9* 102.9*  PLT 100* 103* 92* 96*   Basic Metabolic Panel: Recent Labs  Lab 04/09/22 0936 04/09/22 2150 04/10/22 1735 04/11/22 0550  NA 134* 136 138 139  K 4.3 4.1 4.0 4.0  CL 108 108 113* 110  CO2 22 22 22 23   GLUCOSE 320* 192* 167* 227*  BUN 29* 26* 23 22  CREATININE 1.26* 1.30* 1.31* 1.20  CALCIUM 8.2* 8.6* 8.8* 8.6*   GFR: Estimated Creatinine Clearance: 47.3 mL/min (by C-G formula based on SCr of 1.2 mg/dL). Liver Function Tests: Recent Labs  Lab 04/09/22 0936 04/09/22 2150 04/11/22 0550  AST 14* 15 27  ALT 11 11 14   ALKPHOS 71 73 68  BILITOT 0.6 0.9 0.9  PROT 7.1 7.3 6.9  ALBUMIN 3.7 3.8 3.4*   No results for input(s): "LIPASE", "AMYLASE" in the last 168 hours. No results for input(s): "AMMONIA"  in the last 168 hours. Coagulation Profile: Recent Labs  Lab 04/09/22 2150  INR 1.0   Cardiac Enzymes: No results for input(s): "CKTOTAL", "CKMB", "CKMBINDEX", "TROPONINI" in the last 168 hours. BNP (last 3 results) No results for input(s): "PROBNP" in the last 8760 hours. HbA1C: No results for input(s): "HGBA1C" in the last 72 hours. CBG: Recent Labs  Lab 04/11/22 0042  GLUCAP 165*   Lipid Profile: No results for input(s): "CHOL", "HDL", "LDLCALC", "TRIG", "CHOLHDL", "LDLDIRECT" in the last 72 hours. Thyroid Function Tests: No results for input(s):  "TSH", "T4TOTAL", "FREET4", "T3FREE", "THYROIDAB" in the last 72 hours. Anemia Panel: Recent Labs    04/11/22 0043 04/11/22 0550  FERRITIN 186 204   Sepsis Labs: Recent Labs  Lab 04/09/22 2150 04/10/22 1945 04/10/22 2129 04/11/22 0043  PROCALCITON <0.10  --   --  0.27  LATICACIDVEN 1.4 1.4 1.1  --     Recent Results (from the past 240 hour(s))  Resp Panel by RT-PCR (Flu A&B, Covid) Anterior Nasal Swab     Status: Abnormal   Collection Time: 04/09/22  9:50 PM   Specimen: Anterior Nasal Swab  Result Value Ref Range Status   SARS Coronavirus 2 by RT PCR POSITIVE (A) NEGATIVE Final    Comment: (NOTE) SARS-CoV-2 target nucleic acids are DETECTED.  The SARS-CoV-2 RNA is generally detectable in upper respiratory specimens during the acute phase of infection. Positive results are indicative of the presence of the identified virus, but do not rule out bacterial infection or co-infection with other pathogens not detected by the test. Clinical correlation with patient history and other diagnostic information is necessary to determine patient infection status. The expected result is Negative.  Fact Sheet for Patients: EntrepreneurPulse.com.au  Fact Sheet for Healthcare Providers: IncredibleEmployment.be  This test is not yet approved or cleared by the [place] FDA and  has been authorized for detection and/or diagnosis of SARS-CoV-2 by FDA under an Emergency Use Authorization (EUA).  This EUA will remain in effect (meaning this test can be used) for the duration of  the COVID-19 declaration under Section 564(b)(1) of the A ct, 21 U.S.C. section 360bbb-3(b)(1), unless the authorization is terminated or revoked sooner.     Influenza A by PCR NEGATIVE NEGATIVE Final   Influenza B by PCR NEGATIVE NEGATIVE Final    Comment: (NOTE) The Xpert Xpress SARS-CoV-2/FLU/RSV plus assay is intended as an aid in the diagnosis of influenza from  Nasopharyngeal swab specimens and should not be used as a sole basis for treatment. Nasal washings and aspirates are unacceptable for Xpert Xpress SARS-CoV-2/FLU/RSV testing.  Fact Sheet for Patients: EntrepreneurPulse.com.au  Fact Sheet for Healthcare Providers: IncredibleEmployment.be  This test is not yet approved or cleared by the [place] FDA and has been authorized for detection and/or diagnosis of SARS-CoV-2 by FDA under an Emergency Use Authorization (EUA). This EUA will remain in effect (meaning this test can be used) for the duration of the COVID-19 declaration under Section 564(b)(1) of the Act, 21 U.S.C. section 360bbb-3(b)(1), unless the authorization is terminated or revoked.  Performed at [facility], [address] [place], [place] [address]   Blood Culture (routine x 2)     Status: None (Preliminary result)   Collection Time: 04/09/22  9:50 PM   Specimen: BLOOD  Result Value Ref Range Status   Specimen Description BLOOD LEFT ASSIST CONTROL  Final   Special Requests   Final    BOTTLES DRAWN AEROBIC AND ANAEROBIC Blood Culture results may  not be optimal due to an excessive volume of blood received in culture bottles   Culture   Final    NO GROWTH 2 DAYS Performed at [facility], [address] [place], [place] [address]    Report Status PENDING  Incomplete  Blood Culture (routine x 2)     Status: None (Preliminary result)   Collection Time: 04/09/22  9:50 PM   Specimen: BLOOD  Result Value Ref Range Status   Specimen Description BLOOD RIGHT FOREARM  Final   Special Requests   Final    BOTTLES DRAWN AEROBIC AND ANAEROBIC Blood Culture results may not be optimal due to an excessive volume of blood received in culture bottles   Culture   Final    NO GROWTH 2 DAYS Performed at [facility], [address], [place], [place] [address]    Report Status PENDING  Incomplete  Urine Culture      Status: None   Collection Time: 04/09/22  9:50 PM   Specimen: Urine, Random  Result Value Ref Range Status   Specimen Description   Final    URINE, RANDOM Performed at [facility], [address] [place], [place] [address]    Special Requests   Final    NONE Performed at [facility], [address], [place], [place] [address]    Culture   Final    NO GROWTH Performed at [facility], [place] [address] [place], [place] [address]    Report Status 04/11/2022 FINAL  Final         Radiology Studies: CT ABDOMEN PELVIS W CONTRAST  Result Date: 04/10/2022 CLINICAL DATA:  Concern for present.  Abdominal pain. EXAM: CT ANGIOGRAPHY CHEST CT ABDOMEN AND PELVIS WITH CONTRAST TECHNIQUE: Multidetector CT imaging of the chest was performed using the standard protocol during bolus administration of intravenous contrast. Multiplanar CT image reconstructions and MIPs were obtained to evaluate the vascular anatomy. Multidetector CT imaging of the abdomen and pelvis was performed using the standard protocol during bolus administration of intravenous contrast. RADIATION DOSE REDUCTION: This exam was performed according to the departmental dose-optimization program which includes automated exposure control, adjustment of the mA and/or kV according to patient size and/or use of iterative reconstruction technique. CONTRAST:  159m OMNIPAQUE IOHEXOL 350 MG/ML SOLN COMPARISON:  CT of the chest abdomen pelvis dated 06/01/2007. FINDINGS: Evaluation of this exam is limited due to respiratory motion artifact. CTA CHEST FINDINGS Cardiovascular: There is no cardiomegaly. Trace pericardial effusion anterior to the heart. There is coronary vascular calcification. Mild atherosclerotic calcification of the thoracic aorta. No aneurysmal dilatation or dissection. The origins of the great vessels of the aortic arch appear patent. Evaluation of the pulmonary arteries is limited due to respiratory motion.  No central pulmonary artery embolus identified. Mediastinum/Nodes: No hilar or mediastinal adenopathy. There is a small hiatal hernia. The esophagus is grossly unremarkable. No mediastinal fluid collection. Lungs/Pleura: Bibasilar linear atelectasis/scarring. No focal consolidation, pleural effusion, pneumothorax. Right upper lobe calcified granuloma. The central airways are patent. Musculoskeletal: Degenerative changes of the spine. Age indeterminate compression fracture of superior endplate of T3 with approximately 20% loss of vertebral body height. Correlation with clinical exam and point tenderness recommended. Review of the MIP images confirms the above findings. CT ABDOMEN and PELVIS FINDINGS No intra-abdominal free air or free fluid. Hepatobiliary: The liver is unremarkable. No biliary dilatation. Multiple gallstones. No pericholecystic fluid or evidence of acute cholecystitis by CT. Pancreas: Mild haziness of the fat  adjacent to the distal body and tail of the pancreas. Correlation with pancreatic enzymes recommended to exclude acute pancreatitis. No dilatation of the main pancreatic duct. No fluid collection. Spleen: Multiple small scattered calcified splenic granuloma. Adrenals/Urinary Tract: The adrenal glands unremarkable. There is a 3.5 cm left renal posterior interpolar exophytic cyst as well as several additional subcentimeter hypodense lesions which are too small to characterize. There is no hydronephrosis on either side. There is symmetric enhancement and excretion of contrast by both kidneys. The visualized ureters and urinary bladder appear unremarkable. Stomach/Bowel: There is severe sigmoid diverticulosis without active inflammatory changes. Moderate stool throughout the colon. There is no bowel obstruction or active inflammation. Normal appendix. Vascular/Lymphatic: Moderate aortoiliac atherosclerotic disease. The IVC is unremarkable. No portal venous gas. There is no adenopathy. Reproductive:  The prostate and seminal vesicles are grossly unremarkable no pelvic mass. Other: None Musculoskeletal: Osteopenia with degenerative changes of the spine. Age indeterminate L2 compression fracture with approximately 50% loss of vertebral body height and anterior wedging. Correlation with clinical exam and point tenderness recommended. No retropulsion. Review of the MIP images confirms the above findings. IMPRESSION: 1. No acute intrathoracic pathology. No CT evidence of central pulmonary artery embolus. 2. Mild haziness of the fat adjacent to the distal body and tail of the pancreas. Correlation with pancreatic enzymes recommended to exclude acute pancreatitis. 3. Cholelithiasis. 4. Severe sigmoid diverticulosis without active inflammatory changes. No bowel obstruction. Normal appendix. 5. Age indeterminate T3 and L2 compression fractures. Correlation with clinical exam and point tenderness recommended. 6. Aortic Atherosclerosis (ICD10-I70.0). Electronically Signed   By: Anner Crete M.D.   On: 04/10/2022 00:13   CT Angio Chest PE W and/or Wo Contrast  Result Date: 04/10/2022 CLINICAL DATA:  Concern for present.  Abdominal pain. EXAM: CT ANGIOGRAPHY CHEST CT ABDOMEN AND PELVIS WITH CONTRAST TECHNIQUE: Multidetector CT imaging of the chest was performed using the standard protocol during bolus administration of intravenous contrast. Multiplanar CT image reconstructions and MIPs were obtained to evaluate the vascular anatomy. Multidetector CT imaging of the abdomen and pelvis was performed using the standard protocol during bolus administration of intravenous contrast. RADIATION DOSE REDUCTION: This exam was performed according to the departmental dose-optimization program which includes automated exposure control, adjustment of the mA and/or kV according to patient size and/or use of iterative reconstruction technique. CONTRAST:  137m OMNIPAQUE IOHEXOL 350 MG/ML SOLN COMPARISON:  CT of the chest abdomen  pelvis dated 06/01/2007. FINDINGS: Evaluation of this exam is limited due to respiratory motion artifact. CTA CHEST FINDINGS Cardiovascular: There is no cardiomegaly. Trace pericardial effusion anterior to the heart. There is coronary vascular calcification. Mild atherosclerotic calcification of the thoracic aorta. No aneurysmal dilatation or dissection. The origins of the great vessels of the aortic arch appear patent. Evaluation of the pulmonary arteries is limited due to respiratory motion. No central pulmonary artery embolus identified. Mediastinum/Nodes: No hilar or mediastinal adenopathy. There is a small hiatal hernia. The esophagus is grossly unremarkable. No mediastinal fluid collection. Lungs/Pleura: Bibasilar linear atelectasis/scarring. No focal consolidation, pleural effusion, pneumothorax. Right upper lobe calcified granuloma. The central airways are patent. Musculoskeletal: Degenerative changes of the spine. Age indeterminate compression fracture of superior endplate of T3 with approximately 20% loss of vertebral body height. Correlation with clinical exam and point tenderness recommended. Review of the MIP images confirms the above findings. CT ABDOMEN and PELVIS FINDINGS No intra-abdominal free air or free fluid. Hepatobiliary: The liver is unremarkable. No biliary dilatation. Multiple gallstones. No pericholecystic fluid or evidence  of acute cholecystitis by CT. Pancreas: Mild haziness of the fat adjacent to the distal body and tail of the pancreas. Correlation with pancreatic enzymes recommended to exclude acute pancreatitis. No dilatation of the main pancreatic duct. No fluid collection. Spleen: Multiple small scattered calcified splenic granuloma. Adrenals/Urinary Tract: The adrenal glands unremarkable. There is a 3.5 cm left renal posterior interpolar exophytic cyst as well as several additional subcentimeter hypodense lesions which are too small to characterize. There is no hydronephrosis on  either side. There is symmetric enhancement and excretion of contrast by both kidneys. The visualized ureters and urinary bladder appear unremarkable. Stomach/Bowel: There is severe sigmoid diverticulosis without active inflammatory changes. Moderate stool throughout the colon. There is no bowel obstruction or active inflammation. Normal appendix. Vascular/Lymphatic: Moderate aortoiliac atherosclerotic disease. The IVC is unremarkable. No portal venous gas. There is no adenopathy. Reproductive: The prostate and seminal vesicles are grossly unremarkable no pelvic mass. Other: None Musculoskeletal: Osteopenia with degenerative changes of the spine. Age indeterminate L2 compression fracture with approximately 50% loss of vertebral body height and anterior wedging. Correlation with clinical exam and point tenderness recommended. No retropulsion. Review of the MIP images confirms the above findings. IMPRESSION: 1. No acute intrathoracic pathology. No CT evidence of central pulmonary artery embolus. 2. Mild haziness of the fat adjacent to the distal body and tail of the pancreas. Correlation with pancreatic enzymes recommended to exclude acute pancreatitis. 3. Cholelithiasis. 4. Severe sigmoid diverticulosis without active inflammatory changes. No bowel obstruction. Normal appendix. 5. Age indeterminate T3 and L2 compression fractures. Correlation with clinical exam and point tenderness recommended. 6. Aortic Atherosclerosis (ICD10-I70.0). Electronically Signed   By: Anner Crete M.D.   On: 04/10/2022 00:13   CT HEAD WO CONTRAST (5MM)  Result Date: 04/10/2022 CLINICAL DATA:  Trauma. EXAM: CT HEAD WITHOUT CONTRAST TECHNIQUE: Contiguous axial images were obtained from the base of the skull through the vertex without intravenous contrast. RADIATION DOSE REDUCTION: This exam was performed according to the departmental dose-optimization program which includes automated exposure control, adjustment of the mA and/or kV  according to patient size and/or use of iterative reconstruction technique. COMPARISON:  Head CT dated 08/18/2021. FINDINGS: Brain: Mild age-related atrophy and chronic microvascular ischemic changes. There is no acute intracranial hemorrhage. No mass effect or midline shift. No extra-axial fluid collection. Vascular: No hyperdense vessel or unexpected calcification. Skull: Normal. Negative for fracture or focal lesion. Sinuses/Orbits: There is mild diffuse mucoperiosteal thickening of paranasal sinuses. No air-fluid level. The mastoid air cells clear. Other: None IMPRESSION: 1. No acute intracranial pathology. 2. Mild age-related atrophy and chronic microvascular ischemic changes. Electronically Signed   By: Anner Crete M.D.   On: 04/10/2022 00:03   DG Chest Port 1 View  Result Date: 04/09/2022 CLINICAL DATA:  Questionable sepsis - evaluate for abnormality EXAM: PORTABLE CHEST 1 VIEW COMPARISON:  08/18/2021 FINDINGS: Prominent right cardiophrenic angle fat pad. No confluent opacities or effusions. Heart is normal size. No acute bony abnormality. IMPRESSION: No active disease. Electronically Signed   By: Rolm Baptise M.D.   On: 04/09/2022 22:17        Scheduled Meds:  amLODipine  5 mg Oral Daily   dexamethasone  6 mg Oral Daily   doxazosin  1 mg Oral Daily   enoxaparin (LOVENOX) injection  0.5 mg/kg Subcutaneous Q24H   lisinopril  30 mg Oral Daily   pantoprazole  40 mg Oral Daily   Continuous Infusions:  [START ON 04/12/2022] remdesivir 100 mg in sodium chloride 0.9 %  100 mL IVPB       LOS: 1 day    Time spent: 35 mins     Wyvonnia Dusky, MD [facility] Pager [phone]  If 7PM-7AM, please contact night-coverage [email or web address] 04/11/2022, 8:00 AM

## 2022-04-11 NOTE — Plan of Care (Signed)
  Problem: Coping: Goal: Psychosocial and spiritual needs will be supported Outcome: Progressing   Problem: Coping: Goal: Level of anxiety will decrease Outcome: Progressing   Problem: Elimination: Goal: Will not experience complications related to bowel motility Outcome: Progressing   Problem: Pain Managment: Goal: General experience of comfort will improve Outcome: Progressing

## 2022-04-11 NOTE — Progress Notes (Signed)
   04/11/22 2034  Assess: MEWS Score  Temp 98.3 F (36.8 C)  BP (!) 163/96  MAP (mmHg) 116  Pulse Rate (!) 111  Resp 18  Level of Consciousness Alert  SpO2 94 %  O2 Device Room Air  Assess: MEWS Score  MEWS Temp 0  MEWS Systolic 0  MEWS Pulse 2  MEWS RR 0  MEWS LOC 0  MEWS Score 2  MEWS Score Color Yellow  Assess: if the MEWS score is Yellow or Red  Were vital signs taken at a resting state? Yes  Focused Assessment No change from prior assessment  Does the patient meet 2 or more of the SIRS criteria? Yes  Does the patient have a confirmed or suspected source of infection? Yes  Provider and Rapid Response Notified? No  MEWS guidelines implemented *See Row Information* Yes  Treat  MEWS Interventions Administered scheduled meds/treatments  Pain Scale 0-10  Pain Score 0  Take Vital Signs  Increase Vital Sign Frequency  Yellow: Q 2hr X 2 then Q 4hr X 2, if remains yellow, continue Q 4hrs  Escalate  MEWS: Escalate Yellow: discuss with charge nurse/RN and consider discussing with provider and RRT  Notify: Charge Nurse/RN  Name of Charge Nurse/RN Notified Phyllis, RN  Date Charge Nurse/RN Notified 04/11/22  Time Charge Nurse/RN Notified 2045  Assess: SIRS CRITERIA  SIRS Temperature  0  SIRS Pulse 1  SIRS Respirations  0  SIRS WBC 1  SIRS Score Sum  2

## 2022-04-11 NOTE — Plan of Care (Signed)
  Problem: Education: Goal: Knowledge of risk factors and measures for prevention of condition will improve Outcome: Progressing   Problem: Coping: Goal: Psychosocial and spiritual needs will be supported Outcome: Progressing   Problem: Respiratory: Goal: Will maintain a patent airway Outcome: Progressing Goal: Complications related to the disease process, condition or treatment will be avoided or minimized Outcome: Progressing   Problem: Education: Goal: Knowledge of General Education information will improve Description: Including pain rating scale, medication(s)/side effects and non-pharmacologic comfort measures Outcome: Progressing   Problem: Health Behavior/Discharge Planning: Goal: Ability to manage health-related needs will improve Outcome: Progressing   Problem: Clinical Measurements: Goal: Ability to maintain clinical measurements within normal limits will improve Outcome: Progressing Goal: Will remain free from infection Outcome: Progressing Goal: Diagnostic test results will improve Outcome: Progressing Goal: Respiratory complications will improve Outcome: Progressing Goal: Cardiovascular complication will be avoided Outcome: Progressing   Problem: Activity: Goal: Risk for activity intolerance will decrease Outcome: Progressing   Problem: Nutrition: Goal: Adequate nutrition will be maintained Outcome: Progressing   Problem: Coping: Goal: Level of anxiety will decrease Outcome: Progressing   Problem: Elimination: Goal: Will not experience complications related to bowel motility Outcome: Progressing Goal: Will not experience complications related to urinary retention Outcome: Progressing   Problem: Pain Managment: Goal: General experience of comfort will improve Outcome: Progressing   Problem: Safety: Goal: Ability to remain free from injury will improve Outcome: Progressing   Problem: Skin Integrity: Goal: Risk for impaired skin integrity will  decrease Outcome: Progressing   Problem: Education: Goal: Ability to describe self-care measures that may prevent or decrease complications (Diabetes Survival Skills Education) will improve Outcome: Progressing Goal: Individualized Educational Video(s) Outcome: Progressing   Problem: Coping: Goal: Ability to adjust to condition or change in health will improve Outcome: Progressing   Problem: Fluid Volume: Goal: Ability to maintain a balanced intake and output will improve Outcome: Progressing   Problem: Health Behavior/Discharge Planning: Goal: Ability to identify and utilize available resources and services will improve Outcome: Progressing Goal: Ability to manage health-related needs will improve Outcome: Progressing   Problem: Metabolic: Goal: Ability to maintain appropriate glucose levels will improve Outcome: Progressing   Problem: Nutritional: Goal: Maintenance of adequate nutrition will improve Outcome: Progressing Goal: Progress toward achieving an optimal weight will improve Outcome: Progressing   Problem: Skin Integrity: Goal: Risk for impaired skin integrity will decrease Outcome: Progressing   Problem: Tissue Perfusion: Goal: Adequacy of tissue perfusion will improve Outcome: Progressing   

## 2022-04-12 DIAGNOSIS — D469 Myelodysplastic syndrome, unspecified: Secondary | ICD-10-CM | POA: Diagnosis not present

## 2022-04-12 DIAGNOSIS — U071 COVID-19: Secondary | ICD-10-CM | POA: Diagnosis not present

## 2022-04-12 DIAGNOSIS — J9601 Acute respiratory failure with hypoxia: Secondary | ICD-10-CM | POA: Diagnosis not present

## 2022-04-12 LAB — CBC WITH DIFFERENTIAL/PLATELET
Abs Immature Granulocytes: 0.03 10*3/uL (ref 0.00–0.07)
Basophils Absolute: 0 10*3/uL (ref 0.0–0.1)
Basophils Relative: 0 %
Eosinophils Absolute: 0 10*3/uL (ref 0.0–0.5)
Eosinophils Relative: 0 %
HCT: 31.7 % — ABNORMAL LOW (ref 39.0–52.0)
Hemoglobin: 10.6 g/dL — ABNORMAL LOW (ref 13.0–17.0)
Immature Granulocytes: 0 %
Lymphocytes Relative: 12 %
Lymphs Abs: 0.8 10*3/uL (ref 0.7–4.0)
MCH: 34 pg (ref 26.0–34.0)
MCHC: 33.4 g/dL (ref 30.0–36.0)
MCV: 101.6 fL — ABNORMAL HIGH (ref 80.0–100.0)
Monocytes Absolute: 0.5 10*3/uL (ref 0.1–1.0)
Monocytes Relative: 7 %
Neutro Abs: 5.4 10*3/uL (ref 1.7–7.7)
Neutrophils Relative %: 81 %
Platelets: 109 10*3/uL — ABNORMAL LOW (ref 150–400)
RBC: 3.12 MIL/uL — ABNORMAL LOW (ref 4.22–5.81)
RDW: 14.8 % (ref 11.5–15.5)
WBC: 6.7 10*3/uL (ref 4.0–10.5)
nRBC: 0.3 % — ABNORMAL HIGH (ref 0.0–0.2)

## 2022-04-12 LAB — COMPREHENSIVE METABOLIC PANEL
ALT: 14 U/L (ref 0–44)
AST: 23 U/L (ref 15–41)
Albumin: 3.1 g/dL — ABNORMAL LOW (ref 3.5–5.0)
Alkaline Phosphatase: 63 U/L (ref 38–126)
Anion gap: 4 — ABNORMAL LOW (ref 5–15)
BUN: 45 mg/dL — ABNORMAL HIGH (ref 8–23)
CO2: 22 mmol/L (ref 22–32)
Calcium: 8.8 mg/dL — ABNORMAL LOW (ref 8.9–10.3)
Chloride: 111 mmol/L (ref 98–111)
Creatinine, Ser: 1.38 mg/dL — ABNORMAL HIGH (ref 0.61–1.24)
GFR, Estimated: 50 mL/min — ABNORMAL LOW (ref >60)
Glucose, Bld: 327 mg/dL — ABNORMAL HIGH (ref 70–99)
Potassium: 4.8 mmol/L (ref 3.5–5.1)
Sodium: 137 mmol/L (ref 135–145)
Total Bilirubin: 0.6 mg/dL (ref 0.3–1.2)
Total Protein: 6.5 g/dL (ref 6.5–8.1)

## 2022-04-12 LAB — GLUCOSE, CAPILLARY
Glucose-Capillary: 324 mg/dL — ABNORMAL HIGH (ref 70–99)
Glucose-Capillary: 359 mg/dL — ABNORMAL HIGH (ref 70–99)
Glucose-Capillary: 392 mg/dL — ABNORMAL HIGH (ref 70–99)
Glucose-Capillary: 247 mg/dL — ABNORMAL HIGH (ref 70–99)

## 2022-04-12 LAB — C-REACTIVE PROTEIN: CRP: 11.9 mg/dL — ABNORMAL HIGH (ref <1.0)

## 2022-04-12 MED ORDER — FAMOTIDINE 20 MG PO TABS
40.0000 mg | ORAL_TABLET | Freq: Every day | ORAL | Status: DC | PRN
  Administered 2022-04-12 – 2022-04-18 (×5): 40 mg via ORAL
  Filled 2022-04-12 (×6): qty 2

## 2022-04-12 NOTE — Progress Notes (Signed)
PROGRESS NOTE    Alex Smith  XID:[id number] DOB: 1935-05-27 DOA: 04/10/2022 PCP: [facility]  Assessment & Plan:   Principal Problem:   COVID-19  Assessment and Plan: COVID19: no pneumonia seen on CXR of CTA chest. CTA ches neg for PE. No fever today so far. Continue on remdesivir, steroids, bronchodilators & encourage incentive spirometry   Acute hypoxic respiratory failure: secondary to above. 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 Likely etiology of macrocytic anemia & thrombocytopenia   Multiple myeloma: last received treatment in 2017  HTN: continue on home dose of cardura, lisinopril, amlodipine   DM2: likely poorly controlled. Continue on SSI w/ accuchecks    GERD: continue on PPI   Thrombocytopenia: likely secondary to MDS. Labile   Obesity: BMI 34.3. Complicates overall care & prognosis   DVT prophylaxis: lovenox  Code Status: full  Family Communication: discussed pt's care w/ pt's family at bedside and answered their questions  Disposition Plan: depends on PT/OT recs   Level of care: Telemetry Medical  Status is: Inpatient Remains inpatient appropriate because: severity of illnesss   Consultants:    Procedures:   Antimicrobials:    Subjective: Pt c/o fatigue   Objective: Vitals:   04/11/22 2235 04/12/22 0043 04/12/22 0120 04/12/22 0603  BP: (!) 158/84 (!) 182/98 (!) 150/76 (!) 161/92  Pulse: 97 (!) 102 100 100  Resp: _0 Temp: 98.2 F (36.8 C) 97.9 F (36.6 C)  98.1 F (36.7 C)  TempSrc: Oral Oral  Oral  SpO2: 96% 96%  95%  Weight:      Height:        Intake/Output Summary (Last 24 hours) at 04/12/2022 0729 Last data filed at 04/12/2022 0603 Gross per 24 hour  Intake --   Output 750 ml  Net -750 ml   Filed Weights   04/10/22 1421  Weight: 93.5 kg    Examination:  General exam: Appears calm & comfortable  Respiratory system: decreased breath sounds b/l  Cardiovascular system: S1/S2+. No rubs or gallops  Gastrointestinal system: Abd is soft, NT, obese & hypoactive bowel sounds. Central nervous system: Alert and oriented. Moves all extremities  Psychiatry:  judgement and insight appears normal. Flat mood and affect    Data Reviewed: I have personally reviewed following labs and imaging studies  CBC: Recent Labs  Lab 04/09/22 0936 04/09/22 2150 04/10/22 1735 04/11/22 0550 04/12/22 0502  WBC 3.6* 3.7* 3.8* 4.0 6.7  NEUTROABS 2.7 2.7 2.6 3.1 5.4  HGB 10.2* 10.5* 10.5* 10.6* 10.6*  HCT 30.8* 32.1* 32.4* 32.4* 31.7*  MCV 103.4* 102.2* 104.9* 102.9* 101.6*  PLT 100* 103* 92* 96* 290*   Basic Metabolic Panel: Recent Labs  Lab 04/09/22 0936 04/09/22 2150 04/10/22 1735 04/11/22 0550 04/12/22 0502  NA 134* 136 138 139 137  K 4.3 4.1 4.0 4.0 4.8  CL 108 108 113* 110 111  CO2 _1 GLUCOSE 320* 192* 167* 227* 327*  BUN 29* 26* 23 22 45*  CREATININE 1.26* 1.30* 1.31* 1.20 1.38*  CALCIUM 8.2* 8.6* 8.8* 8.6* 8.8*   GFR: Estimated Creatinine Clearance: 41.1 mL/min (A) (by C-G formula based on SCr of 1.38 mg/dL (H)). Liver Function Tests: Recent Labs  Lab 04/09/22 [phone]  04/09/22 2150 04/11/22 0550 04/12/22 0502  AST 14* _0 ALT _1 ALKPHOS 71 73 68 63  BILITOT 0.6 0.9 0.9 0.6  PROT 7.1 7.3 6.9 6.5  ALBUMIN 3.7 3.8 3.4* 3.1*   No results for input(s): "LIPASE", "AMYLASE" in the last 168 hours. No results for input(s): "AMMONIA" in the last 168 hours. Coagulation Profile: Recent Labs  Lab 04/09/22 2150  INR 1.0   Cardiac Enzymes: No results for input(s): "CKTOTAL", "CKMB", "CKMBINDEX", "TROPONINI" in the last 168 hours. BNP (last 3 results) No results for input(s): "PROBNP" in the last 8760  hours. HbA1C: No results for input(s): "HGBA1C" in the last 72 hours. CBG: Recent Labs  Lab 04/11/22 0042 04/11/22 1051 04/11/22 1619 04/11/22 2037  GLUCAP 165* 311* 351* 312*   Lipid Profile: No results for input(s): "CHOL", "HDL", "LDLCALC", "TRIG", "CHOLHDL", "LDLDIRECT" in the last 72 hours. Thyroid Function Tests: No results for input(s): "TSH", "T4TOTAL", "FREET4", "T3FREE", "THYROIDAB" in the last 72 hours. Anemia Panel: Recent Labs    04/11/22 0043 04/11/22 0550  FERRITIN 186 204   Sepsis Labs: Recent Labs  Lab 04/09/22 2150 04/10/22 1945 04/10/22 2129 04/11/22 0043  PROCALCITON <0.10  --   --  0.27  LATICACIDVEN 1.4 1.4 1.1  --     Recent Results (from the past 240 hour(s))  Resp Panel by RT-PCR (Flu A&B, Covid) Anterior Nasal Swab     Status: Abnormal   Collection Time: 04/09/22  9:50 PM   Specimen: Anterior Nasal Swab  Result Value Ref Range Status   SARS Coronavirus 2 by RT PCR POSITIVE (A) NEGATIVE Final    Comment: (NOTE) SARS-CoV-2 target nucleic acids are DETECTED.  The SARS-CoV-2 RNA is generally detectable in upper respiratory specimens during the acute phase of infection. Positive results are indicative of the presence of the identified virus, but do not rule out bacterial infection or co-infection with other pathogens not detected by the test. Clinical correlation with patient history and other diagnostic information is necessary to determine patient infection status. The expected result is Negative.  Fact Sheet for Patients: EntrepreneurPulse.com.au  Fact Sheet for Healthcare Providers: IncredibleEmployment.be  This test is not yet approved or cleared by the [place] FDA and  has been authorized for detection and/or diagnosis of SARS-CoV-2 by FDA under an Emergency Use Authorization (EUA).  This EUA will remain in effect (meaning this test can be used) for the duration of  the COVID-19  declaration under Section 564(b)(1) of the A ct, 21 U.S.C. section 360bbb-3(b)(1), unless the authorization is terminated or revoked sooner.     Influenza A by PCR NEGATIVE NEGATIVE Final   Influenza B by PCR NEGATIVE NEGATIVE Final    Comment: (NOTE) The Xpert Xpress SARS-CoV-2/FLU/RSV plus assay is intended as an aid in the diagnosis of influenza from Nasopharyngeal swab specimens and should not be used as a sole basis for treatment. Nasal washings and aspirates are unacceptable for Xpert Xpress SARS-CoV-2/FLU/RSV testing.  Fact Sheet for Patients: EntrepreneurPulse.com.au  Fact Sheet for Healthcare Providers: IncredibleEmployment.be  This test is not yet approved or cleared by the [place] FDA and has been authorized for detection and/or diagnosis of SARS-CoV-2 by FDA under an Emergency Use Authorization (EUA). This EUA will remain in effect (meaning this test can be used) for the duration of the COVID-19 declaration under Section 564(b)(1) of the Act, 21 U.S.C. section 360bbb-3(b)(1), unless the authorization is terminated or revoked.  Performed at [facility],  [place], [place] [address]   Blood Culture (routine x 2)     Status: None (Preliminary result)   Collection Time: 04/09/22  9:50 PM   Specimen: BLOOD  Result Value Ref Range Status   Specimen Description BLOOD LEFT ASSIST CONTROL  Final   Special Requests   Final    BOTTLES DRAWN AEROBIC AND ANAEROBIC Blood Culture results may not be optimal due to an excessive volume of blood received in culture bottles   Culture   Final    NO GROWTH 3 DAYS Performed at [facility], [address] [place], [place] [address]    Report Status PENDING  Incomplete  Blood Culture (routine x 2)     Status: None (Preliminary result)   Collection Time: 04/09/22  9:50 PM   Specimen: BLOOD  Result Value Ref Range Status   Specimen Description BLOOD RIGHT  FOREARM  Final   Special Requests   Final    BOTTLES DRAWN AEROBIC AND ANAEROBIC Blood Culture results may not be optimal due to an excessive volume of blood received in culture bottles   Culture   Final    NO GROWTH 3 DAYS Performed at [facility], [address], [place], [place] [address]    Report Status PENDING  Incomplete  Urine Culture     Status: None   Collection Time: 04/09/22  9:50 PM   Specimen: Urine, Random  Result Value Ref Range Status   Specimen Description   Final    URINE, RANDOM Performed at [facility], [address] [place], [place] [address]    Special Requests   Final    NONE Performed at [facility], [address] [place], [place] [address]    Culture   Final    NO GROWTH Performed at [facility], [place] [address], [place], [place] [address]    Report Status 04/11/2022 FINAL  Final         Radiology Studies: No results found.      Scheduled Meds:  amLODipine  5 mg Oral Daily   calcium carbonate  400 mg of elemental calcium Oral BID   dexamethasone  6 mg Oral Daily   doxazosin  1 mg Oral Daily   enoxaparin (LOVENOX) injection  0.5 mg/kg Subcutaneous Q24H   insulin aspart  0-15 Units Subcutaneous TID WC   insulin glargine-yfgn  10 Units Subcutaneous QHS   lisinopril  30 mg Oral Daily   pantoprazole  40 mg Oral Daily   Continuous Infusions:  remdesivir 100 mg in sodium chloride 0.9 % 100 mL IVPB       LOS: 2 days    Time spent: 36 mins     Wyvonnia Dusky, MD [facility] Pager [phone]  If 7PM-7AM, please contact night-coverage [email or web address] 04/12/2022, 7:29 AM

## 2022-04-12 NOTE — TOC Initial Note (Signed)
Transition of Care [facility]) - Initial/Assessment Note    Patient Details  Name: Alex Smith MRN: [id number] Date of Birth: 04-27-1935  [facility] CM/SW Contact:    Laurena Slimmer, RN Phone Number: 04/12/2022, 4:28 PM  Clinical Narrative:          Spoke with patient's son to advise of therapy recommendation. Patient son agreeable to SNF. Patient son aware of 10 day waitin period for COVID. Patient son prefers [facility].        Patient Goals and CMS Choice        Expected Discharge Plan and Services                                                Prior Living Arrangements/Services                       Activities of Daily Living Home Assistive Devices/Equipment: Gilford Rile (specify type) ADL Screening (condition at time of admission) Patient's cognitive ability adequate to safely complete daily activities?: No Is the patient deaf or have difficulty hearing?: Yes Does the patient have difficulty seeing, even when wearing glasses/contacts?: No Does the patient have difficulty concentrating, remembering, or making decisions?: Yes Patient able to express need for assistance with ADLs?: Yes Does the patient have difficulty dressing or bathing?: Yes Independently performs ADLs?: No Communication: Independent Dressing (OT): Needs assistance Is this a change from baseline?: Pre-admission baseline Grooming: Needs assistance Is this a change from baseline?: Pre-admission baseline Feeding: Independent Bathing: Needs assistance Is this a change from baseline?: Pre-admission baseline Toileting: Needs assistance Is this a change from baseline?: Pre-admission baseline In/Out Bed: Needs assistance Is this a change from baseline?: Change from baseline, expected to last <3 days Walks in Home: Needs assistance Is this a change from baseline?: Change from baseline, expected to last <3 days Does the patient have difficulty walking or climbing stairs?:  Yes Weakness of Legs: Both Weakness of Arms/Hands: None  Permission Sought/Granted                  Emotional Assessment              Admission diagnosis:  COVID-19 [U07.1] Patient Active Problem List   Diagnosis Date Noted   COVID-19 04/10/2022   Hypercholesterolemia 11/12/2021   Hyperglycemia due to type 2 diabetes mellitus ([place]) 11/12/2021   Closed compression fracture of L2 lumbar vertebra, initial encounter ([place]) 08/18/2021   Alzheimer's dementia ([place]) 08/18/2021   Acute lower UTI leading to weakness and dehydration 08/18/2021   Laceration of head 08/18/2021   Hypoglycemia 06/29/2021   Severe sepsis ([place]) 05/04/2021   CAP (community acquired pneumonia) 05/02/2021   Vitamin D deficiency 03/10/2019   Depressed mood 03/10/2019   Stage 3a chronic kidney disease (CKD) ([place]) 11/14/2017   Goals of care, counseling/discussion 05/27/2017   Pancytopenia due to antineoplastic chemotherapy ([place]) 11/17/2016   Chemotherapy-induced neutropenia ([place]) 09/22/2016   Urinary incontinence 93/23/5573   Systolic murmur 22/09/5425   Myelodysplastic syndrome, low grade ([place]) 02/05/2016   Multiple myeloma not having achieved remission ([place]) 02/05/2016   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   Myelodysplasia present in bone marrow ([place]) 11/03/2015   Advanced care planning/counseling discussion 05/22/2015   Insomnia 03/03/2015   Monoclonal gammopathy 02/03/2015   Macrocytic anemia 02/03/2015   Obesity (BMI 30-39.9) 10/12/2014   History  of nonmelanoma skin cancer 10/10/2014   Medicare annual wellness visit, subsequent 04/12/2014   Health maintenance examination 04/12/2014   Hairy cell leukemia, in remission ([place])    B12 deficiency    BPH with obstruction/lower urinary tract symptoms    Essential hypertension    GERD (gastroesophageal reflux disease)    Dyslipidemia    Type 2 diabetes mellitus with stage 3 chronic kidney disease ([place])    Diverticulosis    Macular hole 07/07/2012   PCP:   [facility]:   [place], [place] [address] Phone: [phone] Fax: [phone]     Social Determinants of Health (SDOH) Interventions    Readmission Risk Interventions    08/19/2021   10:21 AM 05/11/2021   10:42 AM  Readmission Risk Prevention Plan  Transportation Screening Complete Complete  Medication Review (RN Care Manager) Complete Complete  PCP or Specialist appointment within 3-5 days of discharge Complete Complete  HRI or Home Care Consult Complete Complete  SW Recovery Care/Counseling Consult Complete Complete  Palliative Care Screening Not Applicable Complete  Skilled Nursing Facility Complete Complete

## 2022-04-12 NOTE — Evaluation (Signed)
Physical Therapy Evaluation Patient Details Name: Alex Smith MRN: [id number] DOB: 04/08/1935 Today's Date: 04/12/2022  History of Present Illness  Pt is an 87 y.o. male presenting to hospital 8/23 with c/o fever and weakness and falls; seen in ED day prior and diagnosed with COVID-19.  Pt admitted with COVID-19 and acute hypoxic respiratory failure.  PMH includes anemia, CKD, htn, DM, multiple myeloma (currently on chemotherapy), hairy cell leukemia in remission, Alzheimer's dementia.  Clinical Impression  PT/OT co-evaluation performed.  Prior to hospital admission, pt was modified independent ambulating with RW; lives at the [facility].  Currently pt is 1-2 assist with bed mobility; mod assist x2 with transfers with B UE support; and mod assist x2 to take side steps along bed.  Pt requiring increased assist for balance during session d/t posterior lean in sitting and standing activities.  Pt incontinent of bowel requiring clean-up and bed linen change.  Pt would benefit from skilled PT to address noted impairments and functional limitations (see below for any additional details).  Upon hospital discharge, pt would benefit from SNF.    Recommendations for follow up therapy are one component of a multi-disciplinary discharge planning process, led by the attending physician.  Recommendations may be updated based on patient status, additional functional criteria and insurance authorization.  Follow Up Recommendations Skilled nursing-short term rehab (<3 hours/day) Can patient physically be transported by private vehicle: No    Assistance Recommended at Discharge Frequent or constant Supervision/Assistance  Patient can return home with the following  Two people to help with walking and/or transfers;Two people to help with bathing/dressing/bathroom;Assistance with cooking/housework;Assist for transportation;Help with stairs or ramp for entrance    Equipment Recommendations Rolling walker (2  wheels);BSC/3in1;Wheelchair (measurements PT);Wheelchair cushion (measurements PT)  Recommendations for Other Services  OT consult    Functional Status Assessment Patient has had a recent decline in their functional status and demonstrates the ability to make significant improvements in function in a reasonable and predictable amount of time.     Precautions / Restrictions Precautions Precautions: Fall Restrictions Weight Bearing Restrictions: No      Mobility  Bed Mobility Overal bed mobility: Needs Assistance Bed Mobility: Supine to Sit, Sit to Supine     Supine to sit: Mod assist, HOB elevated (assistance of OT) Sit to supine: Mod assist, +2 for physical assistance   General bed mobility comments: assist for trunk and B LE's    Transfers Overall transfer level: Needs assistance Equipment used: Rolling walker (2 wheels), 2 person hand held assist Transfers: Sit to/from Stand Sit to Stand: Mod assist, +2 physical assistance           General transfer comment: x1 trial standing up to RW from bed; x2 trials standing from bed with B UE support; good initiation to stand but requiring assist to stand, maintain balance (d/t posterior lean), and control descent sitting    Ambulation/Gait Ambulation/Gait assistance: Mod assist, +2 physical assistance Gait Distance (Feet):  (pt able to side step to L a few steps; after sitting rest break able to side step to R a couple feet)     Gait velocity: decreased     General Gait Details: shuffling gait; posterior lean requiring assist and cueing to shift weight forward  Stairs            Wheelchair Mobility    Modified Rankin (Stroke Patients Only)       Balance Overall balance assessment: Needs assistance Sitting-balance support: Single extremity supported, Feet  supported Sitting balance-Leahy Scale: Poor Sitting balance - Comments: steady static sitting with single UE support on bed rail; intermittent posterior  lean in static and dynamic sitting balance requiring min to mod assist for balance   Standing balance support: Bilateral upper extremity supported, Reliant on assistive device for balance Standing balance-Leahy Scale: Poor Standing balance comment: mod assist x2 for standing balance d/t posterior lean                             Pertinent Vitals/Pain Pain Assessment Pain Assessment: No/denies pain HR 113 bpm and O2 sats 92% (on room air) post activity.    Home Living Family/patient expects to be discharged to:: Assisted living                 Home Equipment: Rollator (4 wheels) Additional Comments: [facility]    Prior Function Prior Level of Function : Needs assist       Physical Assist : ADLs (physical)   ADLs (physical): Bathing Mobility Comments: Modified independent ambulating with RW. Per OT eval "Reports that he has been told that he has had recent falls but unable to recall falls." ADLs Comments: Per OT eval "Pt provided inconsistent reports regarding PLOF. Per previous encounter (Jan 2023), required assist for meal preparation, medications, bathing, and cleaning".     Hand Dominance   Dominant Hand: Right    Extremity/Trunk Assessment   Upper Extremity Assessment Upper Extremity Assessment: Overall WFL for tasks assessed    Lower Extremity Assessment Lower Extremity Assessment: Generalized weakness (4+/5 B hip flexion, knee flexion/extension, and DF)    Cervical / Trunk Assessment Cervical / Trunk Assessment: Normal  Communication   Communication: No difficulties  Cognition Arousal/Alertness: Awake/alert Behavior During Therapy: WFL for tasks assessed/performed Overall Cognitive Status: History of cognitive impairments - at baseline                                 General Comments: A&O to self, hospital, some parts of situation; reported it was September 2022 (disoriented to date).  Difficulty following 1-step  cues.        General Comments     Exercises     Assessment/Plan    PT Assessment Patient needs continued PT services  PT Problem List Decreased strength;Decreased activity tolerance;Decreased balance;Decreased mobility;Decreased knowledge of use of DME;Decreased knowledge of precautions       PT Treatment Interventions DME instruction;Gait training;Functional mobility training;Therapeutic activities;Therapeutic exercise;Balance training;Patient/family education    PT Goals (Current goals can be found in the Care Plan section)  Acute Rehab PT Goals Patient Stated Goal: to go home PT Goal Formulation: With patient Time For Goal Achievement: 04/26/22 Potential to Achieve Goals: Good    Frequency Min 2X/week     Co-evaluation PT/OT/SLP Co-Evaluation/Treatment: Yes Reason for Co-Treatment: Necessary to address cognition/behavior during functional activity;For patient/therapist safety;To address functional/ADL transfers PT goals addressed during session: Mobility/safety with mobility;Balance;Proper use of DME OT goals addressed during session: ADL's and self-care       AM-PAC PT "6 Clicks" Mobility  Outcome Measure Help needed turning from your back to your side while in a flat bed without using bedrails?: A Lot Help needed moving from lying on your back to sitting on the side of a flat bed without using bedrails?: A Lot Help needed moving to and from a bed to a chair (including  a wheelchair)?: Total Help needed standing up from a chair using your arms (e.g., wheelchair or bedside chair)?: Total Help needed to walk in hospital room?: Total Help needed climbing 3-5 steps with a railing? : Total 6 Click Score: 8    End of Session Equipment Utilized During Treatment: Gait belt Activity Tolerance: Patient tolerated treatment well Patient left: in bed;with call bell/phone within reach;with bed alarm set Nurse Communication: Mobility status;Precautions PT Visit Diagnosis:  Unsteadiness on feet (R26.81);Other abnormalities of gait and mobility (R26.89);Muscle weakness (generalized) (M62.81);History of falling (Z91.81)    Time: 1100-1139 PT Time Calculation (min) (ACUTE ONLY): 39 min   Charges:   PT Evaluation $PT Eval Low Complexity: 1 Low PT Treatments $Therapeutic Activity: 8-22 mins       Leitha Bleak, PT 04/12/22, 1:35 PM

## 2022-04-12 NOTE — Plan of Care (Signed)
  Problem: Education: Goal: Knowledge of risk factors and measures for prevention of condition will improve Outcome: Progressing   Problem: Coping: Goal: Psychosocial and spiritual needs will be supported Outcome: Progressing   Problem: Respiratory: Goal: Will maintain a patent airway Outcome: Progressing Goal: Complications related to the disease process, condition or treatment will be avoided or minimized Outcome: Progressing   Problem: Education: Goal: Knowledge of General Education information will improve Description: Including pain rating scale, medication(s)/side effects and non-pharmacologic comfort measures Outcome: Progressing   Problem: Health Behavior/Discharge Planning: Goal: Ability to manage health-related needs will improve Outcome: Progressing   Problem: Clinical Measurements: Goal: Ability to maintain clinical measurements within normal limits will improve Outcome: Progressing Goal: Will remain free from infection Outcome: Progressing Goal: Diagnostic test results will improve Outcome: Progressing Goal: Respiratory complications will improve Outcome: Progressing Goal: Cardiovascular complication will be avoided Outcome: Progressing   Problem: Activity: Goal: Risk for activity intolerance will decrease Outcome: Progressing   Problem: Nutrition: Goal: Adequate nutrition will be maintained Outcome: Progressing   Problem: Coping: Goal: Level of anxiety will decrease Outcome: Progressing   Problem: Elimination: Goal: Will not experience complications related to bowel motility Outcome: Progressing Goal: Will not experience complications related to urinary retention Outcome: Progressing   Problem: Pain Managment: Goal: General experience of comfort will improve Outcome: Progressing   Problem: Safety: Goal: Ability to remain free from injury will improve Outcome: Progressing   Problem: Skin Integrity: Goal: Risk for impaired skin integrity will  decrease Outcome: Progressing   Problem: Education: Goal: Ability to describe self-care measures that may prevent or decrease complications (Diabetes Survival Skills Education) will improve Outcome: Progressing Goal: Individualized Educational Video(s) Outcome: Progressing   Problem: Coping: Goal: Ability to adjust to condition or change in health will improve Outcome: Progressing   Problem: Fluid Volume: Goal: Ability to maintain a balanced intake and output will improve Outcome: Progressing   Problem: Health Behavior/Discharge Planning: Goal: Ability to identify and utilize available resources and services will improve Outcome: Progressing Goal: Ability to manage health-related needs will improve Outcome: Progressing   Problem: Metabolic: Goal: Ability to maintain appropriate glucose levels will improve Outcome: Progressing   Problem: Nutritional: Goal: Maintenance of adequate nutrition will improve Outcome: Progressing Goal: Progress toward achieving an optimal weight will improve Outcome: Progressing   Problem: Skin Integrity: Goal: Risk for impaired skin integrity will decrease Outcome: Progressing   Problem: Tissue Perfusion: Goal: Adequacy of tissue perfusion will improve Outcome: Progressing   

## 2022-04-12 NOTE — Inpatient Diabetes Management (Signed)
Inpatient Diabetes Program Recommendations  AACE/ADA: New Consensus Statement on Inpatient Glycemic Control (2015)  Target Ranges:  Prepandial:   less than 140 mg/dL      [facility] postprandial:   less than 180 mg/dL (1-2 hours)      Critically ill patients:  140 - 180 mg/dL    Latest Reference Range & Units 04/11/22 00:42 04/11/22 10:51 04/11/22 16:19 04/11/22 20:37  Glucose-Capillary 70 - 99 mg/dL 165 (H) 311 (H)  11 units Novolog  351 (H)  15 units Novolog  312 (H)    10 units Semglee  (H): Data is abnormally high  Latest Reference Range & Units 04/12/22 08:26  Glucose-Capillary 70 - 99 mg/dL 324 (H)  11 units Novolog   (H): Data is abnormally high    SNF DM Meds: Lantus 15 units QHS  Humalog 0-9 units TID per SSI  Januvia 50 mg Daily   Current Orders: Semglee 10 units QHS  Novolog 0-15 units TID     MD- Note pt getting Decadron 6 mg Daily.  CBG 324 this AM.  Please consider:  1. Increase Semglee to 20 units QHS (0.2 units/kg)  2. Increase Novolog SSI to the Resistant 0-20 unit scale    --Will follow patient during hospitalization--  Wyn Quaker RN, MSN, [place] Diabetes Coordinator Inpatient Glycemic Control Team Team Pager: [phone] (8a-5p)

## 2022-04-12 NOTE — Care Management Important Message (Signed)
Important Message  Patient Details  Name: Alex Smith MRN: [id number] Date of Birth: Dec 09, 1934   Medicare Important Message Given:  N/A - LOS <3 / Initial given by admissions     [name] 04/12/2022, 1:57 PM

## 2022-04-12 NOTE — Progress Notes (Signed)
   04/12/22 1201  Assess: MEWS Score  Temp 98.4 F (36.9 C)  BP (!) 208/88  MAP (mmHg) 122  Pulse Rate (!) 109  Resp 18  SpO2 94 %  O2 Device Room Air  Assess: MEWS Score  MEWS Temp 0  MEWS Systolic 2  MEWS Pulse 1  MEWS RR 0  MEWS LOC 0  MEWS Score 3  MEWS Score Color Yellow  Assess: if the MEWS score is Yellow or Red  Were vital signs taken at a resting state? Yes  Focused Assessment Change from prior assessment (see assessment flowsheet)  Does the patient meet 2 or more of the SIRS criteria? No  Does the patient have a confirmed or suspected source of infection? Yes  Provider and Rapid Response Notified? Yes  Treat  MEWS Interventions Administered prn meds/treatments  Pain Scale 0-10  Pain Score 0  Take Vital Signs  Increase Vital Sign Frequency  Yellow: Q 2hr X 2 then Q 4hr X 2, if remains yellow, continue Q 4hrs  Escalate  MEWS: Escalate Yellow: discuss with charge nurse/RN and consider discussing with provider and RRT  Notify: Charge Nurse/RN  Name of Charge Nurse/RN Notified Siri Cole  Date Charge Nurse/RN Notified 04/12/22  Time Charge Nurse/RN Notified 1210  Notify: Provider  Provider Name/Title Dr Jimmye Norman  Date Provider Notified 04/12/22  Time Provider Notified 1210  Method of Notification  (text)  Notification Reason Change in status  Provider response No new orders (give prn hydralazine)  Date of Provider Response 04/12/22  Time of Provider Response 1218  Document  Patient Outcome Stabilized after interventions  Progress note created (see row info) Yes  Assess: SIRS CRITERIA  SIRS Temperature  0  SIRS Pulse 1  SIRS Respirations  0  SIRS WBC 1  SIRS Score Sum  2

## 2022-04-12 NOTE — Evaluation (Signed)
Occupational Therapy Evaluation Patient Details Name: Alex Smith MRN: [id number] DOB: [birth year] Today's Date: 04/12/2022   History of Present Illness 87 y.o. male with medical history significant of Alzheimer's dementia, stage III kidney disease, Leukemia, multiple myeloma  type 2 diabetes, GERD, hypertension, has interim history of presenting to ED on 8/23 with fever/fatigue and was diagnosed with COVID. He was discharged home with supportive care.  Patient returned to ED 12 hours later from ALF via EMS with increase weakness and falls   Clinical Impression   Pt seen for OT evaluation this date. Upon arrival to room, pt awake and seated upright in bed. Pt alert and oriented to self, place, and parts of situation. No family present to confirm PLOF. Home set-up and PLOF obtained via pt report and previous encounters; plan to confirm with family at later time/date as able. At baseline, pt lives in an ALF and receives assistance for bathing and IADLs. Pt reports that he uses a RW for functional mobility. At beginning of session, pt reported urge to have BM. Pt required MOD A for supine>sit transfers, MOD A+2 for sit<>stand transfers, and MOD A+2 for lateral steps towards HOB d/t decreased balance and difficulty sequencing motor tasks. Pt unable to transfer safely to [facility] this date in setting of current functional impairments. Pt required MAX A for bed-level peri-care and SET-UP assist for bed-level UB ADLs. Pt would benefit from additional skilled OT services to maximize return to PLOF and minimize risk of future falls, injury, caregiver burden, and readmission. Upon discharge, recommend SNF.      Recommendations for follow up therapy are one component of a multi-disciplinary discharge planning process, led by the attending physician.  Recommendations may be updated based on patient status, additional functional criteria and insurance authorization.   Follow Up Recommendations  Skilled nursing-short  term rehab (<3 hours/day)    Assistance Recommended at Discharge Frequent or constant Supervision/Assistance  Patient can return home with the following Two people to help with walking and/or transfers;A lot of help with bathing/dressing/bathroom    Functional Status Assessment  Patient has had a recent decline in their functional status and/or demonstrates limited ability to make significant improvements in function in a reasonable and predictable amount of time  Equipment Recommendations  Other (comment) (defer to next venue of care)       Precautions / Restrictions Precautions Precautions: Fall Restrictions Weight Bearing Restrictions: No      Mobility Bed Mobility Overal bed mobility: Needs Assistance Bed Mobility: Supine to Sit, Sit to Supine     Supine to sit: Mod assist, HOB elevated Sit to supine: Mod assist, +2 for physical assistance        Transfers Overall transfer level: Needs assistance Equipment used: Rolling walker (2 wheels) Transfers: Sit to/from Stand Sit to Stand: Mod assist, +2 physical assistance           General transfer comment: Attempted stand pivot transfer to [facility] however pt unable d/t signficant posterior lean and difficulty motor planning      Balance Overall balance assessment: Needs assistance Sitting-balance support: Single extremity supported, Feet supported Sitting balance-Leahy Scale: Fair Sitting balance - Comments: Able to maintain static sitting balance with single UE holding on to bedrail. Intermittent posterior lean during dynamic sitting balance requiring MIN-MOD A to regain balance   Standing balance support: Bilateral upper extremity supported, Reliant on assistive device for balance Standing balance-Leahy Scale: Poor Standing balance comment: requires MOD A+2 to maintain static standing balance 2/2 posterior  lean                           ADL either performed or assessed with clinical judgement   ADL  Overall ADL's : Needs assistance/impaired     Grooming: Wash/dry hands;Set up;Bed level           Upper Body Dressing : Set up;Bed level Upper Body Dressing Details (indicate cue type and reason): to don/doff hospital gown         Toileting- Clothing Manipulation and Hygiene: Maximal assistance;Bed level       Functional mobility during ADLs: Moderate assistance;+2 for physical assistance (to take lateral side steps)       Vision Baseline Vision/History: 1 Wears glasses Ability to See in Adequate Light: 0 Adequate Patient Visual Report: No change from baseline              Pertinent Vitals/Pain Pain Assessment Pain Assessment: No/denies pain     Hand Dominance Right   Extremity/Trunk Assessment Upper Extremity Assessment Upper Extremity Assessment: Overall WFL for tasks assessed   Lower Extremity Assessment Lower Extremity Assessment: Generalized weakness;Defer to PT evaluation       Communication Communication Communication: No difficulties   Cognition Arousal/Alertness: Awake/alert Behavior During Therapy: WFL for tasks assessed/performed Overall Cognitive Status: History of cognitive impairments - at baseline                                 General Comments: Alert and oriented to self, place, and some parts of situation. Disoriented to date. Pleasant and agreeable throughout although demonstrates difficulty following 1-step comands     General Comments  HR 113, SpO2 92% following functional mobility            Home Living Family/patient expects to be discharged to:: Assisted living                             Home Equipment: Conservation officer, nature (2 wheels)   Additional Comments: [facility]      Prior Functioning/Environment Prior Level of Function : Needs assist       Physical Assist : ADLs (physical)   ADLs (physical): Bathing Mobility Comments: Modified independent ambulating with RW. Reports that  he has been told that he has had recent falls but unable to recall falls ADLs Comments: Pt provided inconsisten reports regarding PLOF. Per previous encounter (Jan 2023), required assist for meal preparation, medications, bathing, and cleaning        OT Problem List: Decreased strength;Decreased activity tolerance;Impaired balance (sitting and/or standing);Decreased cognition;Decreased safety awareness      OT Treatment/Interventions: Self-care/ADL training;Therapeutic exercise;DME and/or AE instruction;Therapeutic activities;Patient/family education;Balance training    OT Goals(Current goals can be found in the care plan section) Acute Rehab OT Goals Patient Stated Goal: to return to spending time with friends OT Goal Formulation: With patient Time For Goal Achievement: 04/26/22 Potential to Achieve Goals: Fair ADL Goals Pt Will Perform Grooming: with set-up;with supervision;sitting Pt Will Perform Upper Body Dressing: sitting Pt Will Transfer to Toilet: with mod assist;stand pivot transfer;bedside commode  OT Frequency: Min 2X/week    Co-evaluation PT/OT/SLP Co-Evaluation/Treatment: Yes Reason for Co-Treatment: Necessary to address cognition/behavior during functional activity;For patient/therapist safety;To address functional/ADL transfers PT goals addressed during session: Mobility/safety with mobility;Balance OT goals addressed during session: ADL's and self-care  AM-PAC OT "6 Clicks" Daily Activity     Outcome Measure Help from another person eating meals?: None Help from another person taking care of personal grooming?: A Little Help from another person toileting, which includes using toliet, bedpan, or urinal?: A Lot Help from another person bathing (including washing, rinsing, drying)?: A Lot Help from another person to put on and taking off regular upper body clothing?: A Little Help from another person to put on and taking off regular lower body clothing?: A Lot 6  Click Score: 16   End of Session Equipment Utilized During Treatment: Gait belt;Rolling walker (2 wheels) Nurse Communication: Mobility status  Activity Tolerance: Patient tolerated treatment well Patient left: in bed;with call bell/phone within reach;with bed alarm set  OT Visit Diagnosis: Unsteadiness on feet (R26.81);Muscle weakness (generalized) (M62.81);Repeated falls (R29.6)                Time: 0160-1093 OT Time Calculation (min): 54 min Charges:  OT General Charges $OT Visit: 1 Visit OT Evaluation $OT Eval Moderate Complexity: 1 Mod OT Treatments $Self Care/Home Management : 23-37 mins  Fredirick Maudlin, OTR/L [place]

## 2022-04-13 DIAGNOSIS — D469 Myelodysplastic syndrome, unspecified: Secondary | ICD-10-CM | POA: Diagnosis not present

## 2022-04-13 DIAGNOSIS — U071 COVID-19: Secondary | ICD-10-CM | POA: Diagnosis not present

## 2022-04-13 DIAGNOSIS — J9601 Acute respiratory failure with hypoxia: Secondary | ICD-10-CM | POA: Diagnosis not present

## 2022-04-13 LAB — COMPREHENSIVE METABOLIC PANEL
ALT: 23 U/L (ref 0–44)
AST: 42 U/L — ABNORMAL HIGH (ref 15–41)
Albumin: 3.1 g/dL — ABNORMAL LOW (ref 3.5–5.0)
Alkaline Phosphatase: 57 U/L (ref 38–126)
Anion gap: 5 (ref 5–15)
BUN: 60 mg/dL — ABNORMAL HIGH (ref 8–23)
CO2: 20 mmol/L — ABNORMAL LOW (ref 22–32)
Calcium: 8.8 mg/dL — ABNORMAL LOW (ref 8.9–10.3)
Chloride: 112 mmol/L — ABNORMAL HIGH (ref 98–111)
Creatinine, Ser: 1.63 mg/dL — ABNORMAL HIGH (ref 0.61–1.24)
GFR, Estimated: 41 mL/min — ABNORMAL LOW (ref >60)
Glucose, Bld: 243 mg/dL — ABNORMAL HIGH (ref 70–99)
Potassium: 4.3 mmol/L (ref 3.5–5.1)
Sodium: 137 mmol/L (ref 135–145)
Total Bilirubin: 0.7 mg/dL (ref 0.3–1.2)
Total Protein: 6.4 g/dL — ABNORMAL LOW (ref 6.5–8.1)

## 2022-04-13 LAB — CBC WITH DIFFERENTIAL/PLATELET
Abs Immature Granulocytes: 0.04 10*3/uL (ref 0.00–0.07)
Basophils Absolute: 0 10*3/uL (ref 0.0–0.1)
Basophils Relative: 0 %
Eosinophils Absolute: 0 10*3/uL (ref 0.0–0.5)
Eosinophils Relative: 0 %
HCT: 30.4 % — ABNORMAL LOW (ref 39.0–52.0)
Hemoglobin: 10.2 g/dL — ABNORMAL LOW (ref 13.0–17.0)
Immature Granulocytes: 1 %
Lymphocytes Relative: 11 %
Lymphs Abs: 0.9 10*3/uL (ref 0.7–4.0)
MCH: 33.3 pg (ref 26.0–34.0)
MCHC: 33.6 g/dL (ref 30.0–36.0)
MCV: 99.3 fL (ref 80.0–100.0)
Monocytes Absolute: 0.4 10*3/uL (ref 0.1–1.0)
Monocytes Relative: 6 %
Neutro Abs: 6.6 10*3/uL (ref 1.7–7.7)
Neutrophils Relative %: 82 %
Platelets: 102 10*3/uL — ABNORMAL LOW (ref 150–400)
RBC: 3.06 MIL/uL — ABNORMAL LOW (ref 4.22–5.81)
RDW: 15 % (ref 11.5–15.5)
WBC: 7.9 10*3/uL (ref 4.0–10.5)
nRBC: 0 % (ref 0.0–0.2)

## 2022-04-13 LAB — GLUCOSE, CAPILLARY
Glucose-Capillary: 244 mg/dL — ABNORMAL HIGH (ref 70–99)
Glucose-Capillary: 382 mg/dL — ABNORMAL HIGH (ref 70–99)
Glucose-Capillary: 385 mg/dL — ABNORMAL HIGH (ref 70–99)
Glucose-Capillary: 321 mg/dL — ABNORMAL HIGH (ref 70–99)
Glucose-Capillary: 343 mg/dL — ABNORMAL HIGH (ref 70–99)

## 2022-04-13 LAB — C-REACTIVE PROTEIN: CRP: 4.2 mg/dL — ABNORMAL HIGH (ref <1.0)

## 2022-04-13 MED ORDER — AMLODIPINE BESYLATE 5 MG PO TABS
5.0000 mg | ORAL_TABLET | Freq: Once | ORAL | Status: AC
Start: 2022-04-13 — End: 2022-04-13
  Administered 2022-04-13: 5 mg via ORAL
  Filled 2022-04-13: qty 1

## 2022-04-13 MED ORDER — INSULIN ASPART 100 UNIT/ML IJ SOLN
0.0000 [IU] | Freq: Three times a day (TID) | INTRAMUSCULAR | Status: DC
  Administered 2022-04-13: 15 [IU] via SUBCUTANEOUS
  Administered 2022-04-14: 4 [IU] via SUBCUTANEOUS
  Administered 2022-04-14 (×2): 11 [IU] via SUBCUTANEOUS
  Administered 2022-04-15: 4 [IU] via SUBCUTANEOUS
  Administered 2022-04-15: 20 [IU] via SUBCUTANEOUS
  Administered 2022-04-15: 7 [IU] via SUBCUTANEOUS
  Administered 2022-04-16 (×2): 11 [IU] via SUBCUTANEOUS
  Administered 2022-04-16: 3 [IU] via SUBCUTANEOUS
  Administered 2022-04-17: 20 [IU] via SUBCUTANEOUS
  Administered 2022-04-17: 11 [IU] via SUBCUTANEOUS
  Administered 2022-04-17: 4 [IU] via SUBCUTANEOUS
  Administered 2022-04-18 (×2): 11 [IU] via SUBCUTANEOUS
  Administered 2022-04-18: 7 [IU] via SUBCUTANEOUS
  Administered 2022-04-19: 4 [IU] via SUBCUTANEOUS
  Administered 2022-04-19: 7 [IU] via SUBCUTANEOUS
  Administered 2022-04-19 – 2022-04-20 (×2): 4 [IU] via SUBCUTANEOUS
  Administered 2022-04-20: 7 [IU] via SUBCUTANEOUS
  Filled 2022-04-13 (×20): qty 1

## 2022-04-13 MED ORDER — DEXAMETHASONE 4 MG PO TABS
4.0000 mg | ORAL_TABLET | Freq: Every day | ORAL | Status: DC
  Administered 2022-04-14 – 2022-04-15 (×2): 4 mg via ORAL
  Filled 2022-04-13 (×2): qty 1

## 2022-04-13 MED ORDER — INSULIN ASPART 100 UNIT/ML IJ SOLN
0.0000 [IU] | Freq: Every day | INTRAMUSCULAR | Status: DC
  Administered 2022-04-13: 4 [IU] via SUBCUTANEOUS
  Administered 2022-04-14: 2 [IU] via SUBCUTANEOUS
  Administered 2022-04-15: 5 [IU] via SUBCUTANEOUS
  Administered 2022-04-18: 3 [IU] via SUBCUTANEOUS
  Filled 2022-04-13 (×4): qty 1

## 2022-04-13 MED ORDER — DEXAMETHASONE 4 MG PO TABS
4.0000 mg | ORAL_TABLET | Freq: Every day | ORAL | Status: DC
Start: 2022-04-14 — End: 2022-04-13

## 2022-04-13 MED ORDER — AMLODIPINE BESYLATE 10 MG PO TABS
10.0000 mg | ORAL_TABLET | Freq: Every day | ORAL | Status: DC
  Administered 2022-04-14 – 2022-04-20 (×7): 10 mg via ORAL
  Filled 2022-04-13 (×7): qty 1

## 2022-04-13 NOTE — NC FL2 (Signed)
[place] LEVEL OF CARE SCREENING TOOL     IDENTIFICATION  Patient Name: Alex Smith Birthdate: 1934/09/26 Sex: male Admission Date (Current Location): 04/10/2022  [facility] and [place] Number:  Engineering geologist and Address:  [facility], [address] [place], [place] [address]      Provider Number: [id number]  Attending Physician Name and Address:  Wyvonnia Dusky, MD  Relative Name and Phone Number:  Amit Leece - [phone]    Current Level of Care: Hospital Recommended Level of Care: [place] Prior Approval Number:    Date Approved/Denied:   PASRR Number: [id number]  Discharge Plan: SNF    Current Diagnoses: Patient Active Problem List   Diagnosis Date Noted   COVID-19 04/10/2022   Hypercholesterolemia 11/12/2021   Hyperglycemia due to type 2 diabetes mellitus ([place]) 11/12/2021   Closed compression fracture of L2 lumbar vertebra, initial encounter ([place]) 08/18/2021   Alzheimer's dementia ([place]) 08/18/2021   Acute lower UTI leading to weakness and dehydration 08/18/2021   Laceration of head 08/18/2021   Hypoglycemia 06/29/2021   Severe sepsis ([place]) 05/04/2021   CAP (community acquired pneumonia) 05/02/2021   Vitamin D deficiency 03/10/2019   Depressed mood 03/10/2019   Stage 3a chronic kidney disease (CKD) ([place]) 11/14/2017   Goals of care, counseling/discussion 05/27/2017   Pancytopenia due to antineoplastic chemotherapy ([place]) 11/17/2016   Chemotherapy-induced neutropenia ([place]) 09/22/2016   Urinary incontinence 96/75/9163   Systolic murmur 84/66/5993   Myelodysplastic syndrome, low grade ([place]) 02/05/2016   Multiple myeloma not having achieved remission ([place]) 02/05/2016   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   Myelodysplasia present in bone marrow ([place]) 11/03/2015   Advanced care planning/counseling discussion 05/22/2015   Insomnia 03/03/2015   Monoclonal gammopathy 02/03/2015   Macrocytic anemia  02/03/2015   Obesity (BMI 30-39.9) 10/12/2014   History of nonmelanoma skin cancer 10/10/2014   Medicare annual wellness visit, subsequent 04/12/2014   Health maintenance examination 04/12/2014   Hairy cell leukemia, in remission ([place])    B12 deficiency    BPH with obstruction/lower urinary tract symptoms    Essential hypertension    GERD (gastroesophageal reflux disease)    Dyslipidemia    Type 2 diabetes mellitus with stage 3 chronic kidney disease (HCC)    Diverticulosis    Macular hole 07/07/2012    Orientation RESPIRATION BLADDER Height & Weight     Self, Time  Normal Incontinent Weight: 93.5 kg Height:  5' 5"  (165.1 cm)  BEHAVIORAL SYMPTOMS/MOOD NEUROLOGICAL BOWEL NUTRITION STATUS      Incontinent Diet  AMBULATORY STATUS COMMUNICATION OF NEEDS Skin   Extensive Assist Verbally Other (Comment) (rash)                       Personal Care Assistance Level of Assistance  Bathing, Dressing Bathing Assistance: Maximum assistance   Dressing Assistance: Maximum assistance     Functional Limitations Info             SPECIAL CARE FACTORS FREQUENCY  PT (By licensed PT), OT (By licensed OT)     PT Frequency: 5 days a week OT Frequency: 5 days a week            Contractures Contractures Info: Not present    Additional Factors Info  Code Status Code Status Info: Full             Current Medications (04/13/2022):  This is the current hospital active medication list Current Facility-Administered Medications  Medication Dose Route Frequency Provider Last Rate Last Admin   acetaminophen (TYLENOL) tablet 650 mg  650 mg Oral Q6H PRN Clance Boll, MD   650 mg at 04/11/22 0207   albuterol (VENTOLIN HFA) 108 (90 Base) MCG/ACT inhaler 2 puff  2 puff Inhalation Q6H PRN Clance Boll, MD       [START ON 04/14/2022] amLODipine (NORVASC) tablet 10 mg  10 mg Oral Daily Wyvonnia Dusky, MD       calcium carbonate (TUMS - dosed in mg elemental calcium)  chewable tablet 400 mg of elemental calcium  400 mg of elemental calcium Oral BID Wyvonnia Dusky, MD   400 mg of elemental calcium at 04/13/22 0832   [START ON 04/14/2022] dexamethasone (DECADRON) tablet 4 mg  4 mg Oral Q breakfast Delena Bali, RPH       doxazosin (CARDURA) tablet 1 mg  1 mg Oral Daily Wyvonnia Dusky, MD   1 mg at 04/13/22 0848   enoxaparin (LOVENOX) injection 47.5 mg  0.5 mg/kg Subcutaneous Q24H Myles Rosenthal A, MD   47.5 mg at 04/13/22 0901   famotidine (PEPCID) tablet 40 mg  40 mg Oral Daily PRN Wyvonnia Dusky, MD   40 mg at 04/12/22 1306   hydrALAZINE (APRESOLINE) injection 20 mg  20 mg Intravenous Q6H PRN Wyvonnia Dusky, MD   20 mg at 04/13/22 0646   insulin aspart (novoLOG) injection 0-20 Units  0-20 Units Subcutaneous TID WC Wyvonnia Dusky, MD       insulin aspart (novoLOG) injection 0-5 Units  0-5 Units Subcutaneous QHS Wyvonnia Dusky, MD       insulin glargine-yfgn [facility]) injection 10 Units  10 Units Subcutaneous QHS Wyvonnia Dusky, MD   10 Units at 04/12/22 2228   ondansetron (ZOFRAN) tablet 4 mg  4 mg Oral Q6H PRN Clance Boll, MD       Or   ondansetron [facility]) injection 4 mg  4 mg Intravenous Q6H PRN Clance Boll, MD       pantoprazole (PROTONIX) EC tablet 40 mg  40 mg Oral Daily Myles Rosenthal A, MD   40 mg at 04/13/22 9371   Facility-Administered Medications Ordered in Other Encounters  Medication Dose Route Frequency Provider Last Rate Last Admin   heparin lock flush 100 unit/mL  500 Units Intravenous Once Corcoran, Melissa C, MD       sodium chloride 0.9 % injection 10 mL  10 mL Intravenous PRN Nolon Stalls C, MD   10 mL at 03/03/15 0903   sodium chloride 0.9 % injection 10 mL  10 mL Intracatheter PRN Nolon Stalls C, MD   10 mL at 03/10/15 1410   sodium chloride flush (NS) 0.9 % injection 10 mL  10 mL Intravenous PRN Lequita Asal, MD   10 mL at 07/23/18 1517     Discharge  Medications: Please see discharge summary for a list of discharge medications.  Relevant Imaging Results:  Relevant Lab Results:   Additional Information SS #: [id number]  Valente David, RN

## 2022-04-13 NOTE — TOC Progression Note (Signed)
Transition of Care [facility]) - Progression Note    Patient Details  Name: Alex Smith MRN: [id number] Date of Birth: [birth year]  Transition of [facility]) CM/SW Contact  Valente David, RN Phone Number: 04/13/2022, 2:25 PM  Clinical Narrative:      Spoke with son Alex Smith.  Aware of recommendations for SNF.  Prefers [facility], but alternate choices are [facility] and [facility].  Bed search initiated, will keep son updated on offers.      Expected Discharge Plan and Services                                                 Social Determinants of Health (SDOH) Interventions    Readmission Risk Interventions    08/19/2021   10:21 AM 05/11/2021   10:42 AM  Readmission Risk Prevention Plan  Transportation Screening Complete Complete  Medication Review ([place]) Complete Complete  PCP or Specialist appointment within 3-5 days of discharge Complete Complete  HRI or Home Care Consult Complete Complete  SW Recovery Care/Counseling Consult Complete Complete  Palliative Care Screening Not Applicable Complete  Skilled Nursing Facility Complete Complete

## 2022-04-13 NOTE — Plan of Care (Signed)
  Problem: Education: Goal: Knowledge of risk factors and measures for prevention of condition will improve Outcome: Progressing   Problem: Coping: Goal: Psychosocial and spiritual needs will be supported Outcome: Progressing   Problem: Respiratory: Goal: Will maintain a patent airway Outcome: Progressing Goal: Complications related to the disease process, condition or treatment will be avoided or minimized Outcome: Progressing   Problem: Education: Goal: Knowledge of General Education information will improve Description: Including pain rating scale, medication(s)/side effects and non-pharmacologic comfort measures Outcome: Progressing   Problem: Health Behavior/Discharge Planning: Goal: Ability to manage health-related needs will improve Outcome: Progressing   Problem: Clinical Measurements: Goal: Ability to maintain clinical measurements within normal limits will improve Outcome: Progressing Goal: Will remain free from infection Outcome: Progressing Goal: Diagnostic test results will improve Outcome: Progressing Goal: Respiratory complications will improve Outcome: Progressing Goal: Cardiovascular complication will be avoided Outcome: Progressing   Problem: Activity: Goal: Risk for activity intolerance will decrease Outcome: Progressing   Problem: Nutrition: Goal: Adequate nutrition will be maintained Outcome: Progressing   Problem: Coping: Goal: Level of anxiety will decrease Outcome: Progressing   Problem: Elimination: Goal: Will not experience complications related to bowel motility Outcome: Progressing Goal: Will not experience complications related to urinary retention Outcome: Progressing   Problem: Pain Managment: Goal: General experience of comfort will improve Outcome: Progressing   Problem: Safety: Goal: Ability to remain free from injury will improve Outcome: Progressing   Problem: Skin Integrity: Goal: Risk for impaired skin integrity will  decrease Outcome: Progressing   Problem: Education: Goal: Ability to describe self-care measures that may prevent or decrease complications (Diabetes Survival Skills Education) will improve Outcome: Progressing Goal: Individualized Educational Video(s) Outcome: Progressing   Problem: Coping: Goal: Ability to adjust to condition or change in health will improve Outcome: Progressing   Problem: Fluid Volume: Goal: Ability to maintain a balanced intake and output will improve Outcome: Progressing   Problem: Health Behavior/Discharge Planning: Goal: Ability to identify and utilize available resources and services will improve Outcome: Progressing Goal: Ability to manage health-related needs will improve Outcome: Progressing   Problem: Metabolic: Goal: Ability to maintain appropriate glucose levels will improve Outcome: Progressing   Problem: Nutritional: Goal: Maintenance of adequate nutrition will improve Outcome: Progressing Goal: Progress toward achieving an optimal weight will improve Outcome: Progressing   Problem: Skin Integrity: Goal: Risk for impaired skin integrity will decrease Outcome: Progressing   Problem: Tissue Perfusion: Goal: Adequacy of tissue perfusion will improve Outcome: Progressing   

## 2022-04-13 NOTE — Progress Notes (Signed)
PROGRESS NOTE    Alex CLEAR  Smith:[id number] DOB: Sep 24, 1934 DOA: 04/10/2022 PCP: [facility]  Assessment & Plan:   Principal Problem:   COVID-19  Assessment and Plan: COVID19: no pneumonia seen on CXR of CTA chest. CTA ches neg for PE. Continue on remdesivir, start steroid taper, bronchodilators & encourage incentive spirometry   Acute hypoxic respiratory failure: secondary to above. Weaned off of supplemental oxygen   Generalized weakness: PT/OT recs SNF. Will need to quarantine for 10 days before pt can d/c to SNF   Likely CKD: unknown baseline Cr/GFR, currently stage IIIb. Holding lisinopril .   Alzheimer's dementia: continue on aricept  Hairy cell leukemia: last received chemo in July 2016. In remission. 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 Likely etiology of anemia & thrombocytopenia   Multiple myeloma: last received treatment in 2017  HTN: continue on home dose of cardura and increased amlodipine to 71m. Holding lisinopril as Cr is trending up   DM2: likely poorly controlled. Increased SSI today, added daily qhs SSI coverage & continue on glargine. Likely secondary to steroid use   GERD: continue on PPI  Thrombocytopenia: likely secondary to MDS. Labile.   Obesity: BMI 34.3. Complicates overall care & prognosis    DVT prophylaxis: lovenox  Code Status: full  Family Communication: discussed pt's care w/ pt's family and answered their questions  Disposition Plan: d/c to SNF but has [place] a [place]   Level of care: Telemetry Medical  Status is: Inpatient Remains inpatient appropriate because: has to complete a 10 day quarantine for COVID19 prior to d/c    Consultants:    Procedures:   Antimicrobials:    Subjective: Pt c/o malaise   Objective: Vitals:   04/12/22 1455 04/12/22 2127 04/12/22 2351 04/13/22 0608  BP: 138/60 (!) 152/83 (!) 149/89 (!)  167/68  Pulse: 70 92 97 89  Resp:  18 18 18   Temp: (!) 97.5 F (36.4 C) 98.1 F (36.7 C) 98 F (36.7 C) 98 F (36.7 C)  TempSrc:  Oral Oral   SpO2: 95% 96% 96% 95%  Weight:      Height:        Intake/Output Summary (Last 24 hours) at 04/13/2022 0727 Last data filed at 04/12/2022 2125 Gross per 24 hour  Intake 100 ml  Output 350 ml  Net -250 ml   Filed Weights   04/10/22 1421  Weight: 93.5 kg    Examination:  General exam: Appears comfortable  Respiratory system: diminished breath sounds b/l  Cardiovascular system: S1 & S2+. No rubs or clicks  Gastrointestinal system: Abd is soft, NT, obese & normal bowel sounds Central nervous system: Alert and awake. Moves all extremities  Psychiatry:  judgement and insight appears not at baseline. Flat mood and affect     Data Reviewed: I have personally reviewed following labs and imaging studies  CBC: Recent Labs  Lab 04/09/22 2150 04/10/22 1735 04/11/22 0550 04/12/22 0502 04/13/22 0540  WBC 3.7* 3.8* 4.0 6.7 7.9  NEUTROABS 2.7 2.6 3.1 5.4 6.6  HGB 10.5* 10.5* 10.6* 10.6* 10.2*  HCT 32.1* 32.4* 32.4* 31.7* 30.4*  MCV 102.2* 104.9* 102.9* 101.6* 99.3  PLT 103* 92* 96* 109* 1048   Basic Metabolic Panel: Recent Labs  Lab 04/09/22 2150 04/10/22 1735 04/11/22 0550 04/12/22 0502 04/13/22 0540  NA 136 138 139 137 137  K 4.1 4.0 4.0 4.8 4.3  CL  108 113* 110 111 112*  CO2 22 22 23 22  20*  GLUCOSE 192* 167* 227* 327* 243*  BUN 26* 23 22 45* 60*  CREATININE 1.30* 1.31* 1.20 1.38* 1.63*  CALCIUM 8.6* 8.8* 8.6* 8.8* 8.8*   GFR: Estimated Creatinine Clearance: 34.8 mL/min (A) (by C-G formula based on SCr of 1.63 mg/dL (H)). Liver Function Tests: Recent Labs  Lab 04/09/22 0936 04/09/22 2150 04/11/22 0550 04/12/22 0502 04/13/22 0540  AST 14* 15 27 23  42*  ALT 11 11 14 14 23   ALKPHOS 71 73 68 63 57  BILITOT 0.6 0.9 0.9 0.6 0.7  PROT 7.1 7.3 6.9 6.5 6.4*  ALBUMIN 3.7 3.8 3.4* 3.1* 3.1*   No results for input(s):  "LIPASE", "AMYLASE" in the last 168 hours. No results for input(s): "AMMONIA" in the last 168 hours. Coagulation Profile: Recent Labs  Lab 04/09/22 2150  INR 1.0   Cardiac Enzymes: No results for input(s): "CKTOTAL", "CKMB", "CKMBINDEX", "TROPONINI" in the last 168 hours. BNP (last 3 results) No results for input(s): "PROBNP" in the last 8760 hours. HbA1C: No results for input(s): "HGBA1C" in the last 72 hours. CBG: Recent Labs  Lab 04/11/22 2037 04/12/22 0826 04/12/22 1202 04/12/22 1612 04/12/22 2122  GLUCAP 312* 324* 359* 392* 247*   Lipid Profile: No results for input(s): "CHOL", "HDL", "LDLCALC", "TRIG", "CHOLHDL", "LDLDIRECT" in the last 72 hours. Thyroid Function Tests: No results for input(s): "TSH", "T4TOTAL", "FREET4", "T3FREE", "THYROIDAB" in the last 72 hours. Anemia Panel: Recent Labs    04/11/22 0043 04/11/22 0550  FERRITIN 186 204   Sepsis Labs: Recent Labs  Lab 04/09/22 2150 04/10/22 1945 04/10/22 2129 04/11/22 0043  PROCALCITON <0.10  --   --  0.27  LATICACIDVEN 1.4 1.4 1.1  --     Recent Results (from the past 240 hour(s))  Resp Panel by RT-PCR (Flu A&B, Covid) Anterior Nasal Swab     Status: Abnormal   Collection Time: 04/09/22  9:50 PM   Specimen: Anterior Nasal Swab  Result Value Ref Range Status   SARS Coronavirus 2 by RT PCR POSITIVE (A) NEGATIVE Final    Comment: (NOTE) SARS-CoV-2 target nucleic acids are DETECTED.  The SARS-CoV-2 RNA is generally detectable in upper respiratory specimens during the acute phase of infection. Positive results are indicative of the presence of the identified virus, but do not rule out bacterial infection or co-infection with other pathogens not detected by the test. Clinical correlation with patient history and other diagnostic information is necessary to determine patient infection status. The expected result is Negative.  Fact Sheet for Patients: EntrepreneurPulse.com.au  Fact  Sheet for Healthcare Providers: IncredibleEmployment.be  This test is not yet approved or cleared by the [place] FDA and  has been authorized for detection and/or diagnosis of SARS-CoV-2 by FDA under an Emergency Use Authorization (EUA).  This EUA will remain in effect (meaning this test can be used) for the duration of  the COVID-19 declaration under Section 564(b)(1) of the A ct, 21 U.S.C. section 360bbb-3(b)(1), unless the authorization is terminated or revoked sooner.     Influenza A by PCR NEGATIVE NEGATIVE Final   Influenza B by PCR NEGATIVE NEGATIVE Final    Comment: (NOTE) The Xpert Xpress SARS-CoV-2/FLU/RSV plus assay is intended as an aid in the diagnosis of influenza from Nasopharyngeal swab specimens and should not be used as a sole basis for treatment. Nasal washings and aspirates are unacceptable for Xpert Xpress SARS-CoV-2/FLU/RSV testing.  Fact Sheet for Patients: EntrepreneurPulse.com.au  Fact  Sheet for Healthcare Providers: IncredibleEmployment.be  This test is not yet approved or cleared by the [place] and has been authorized for detection and/or diagnosis of SARS-CoV-2 by FDA under an Emergency Use Authorization (EUA). This EUA will remain in effect (meaning this test can be used) for the duration of the COVID-19 declaration under Section 564(b)(1) of the Act, 21 U.S.C. section 360bbb-3(b)(1), unless the authorization is terminated or revoked.  Performed at [facility], [address], [place], [place] [address]   Blood Culture (routine x 2)     Status: None (Preliminary result)   Collection Time: 04/09/22  9:50 PM   Specimen: BLOOD  Result Value Ref Range Status   Specimen Description BLOOD LEFT ASSIST CONTROL  Final   Special Requests   Final    BOTTLES DRAWN AEROBIC AND ANAEROBIC Blood Culture results may not be optimal due to an excessive volume of blood received in  culture bottles   Culture   Final    NO GROWTH 4 DAYS Performed at [facility], [address] [place], [place] [address]    Report Status PENDING  Incomplete  Blood Culture (routine x 2)     Status: None (Preliminary result)   Collection Time: 04/09/22  9:50 PM   Specimen: BLOOD  Result Value Ref Range Status   Specimen Description BLOOD RIGHT FOREARM  Final   Special Requests   Final    BOTTLES DRAWN AEROBIC AND ANAEROBIC Blood Culture results may not be optimal due to an excessive volume of blood received in culture bottles   Culture   Final    NO GROWTH 4 DAYS Performed at [facility], [address], [place], [place] [address]    Report Status PENDING  Incomplete  Urine Culture     Status: None   Collection Time: 04/09/22  9:50 PM   Specimen: Urine, Random  Result Value Ref Range Status   Specimen Description   Final    URINE, RANDOM Performed at [facility], [address] [place], [place] [address]    Special Requests   Final    NONE Performed at [facility], [address] [place], [place] [address]    Culture   Final    NO GROWTH Performed at [facility], [place] [address], [place], [place] [address]    Report Status 04/11/2022 FINAL  Final         Radiology Studies: No results found.      Scheduled Meds:  amLODipine  5 mg Oral Daily   calcium carbonate  400 mg of elemental calcium Oral BID   dexamethasone  6 mg Oral Daily   doxazosin  1 mg Oral Daily   enoxaparin (LOVENOX) injection  0.5 mg/kg Subcutaneous Q24H   insulin aspart  0-15 Units Subcutaneous TID WC   insulin glargine-yfgn  10 Units Subcutaneous QHS   pantoprazole  40 mg Oral Daily   Continuous Infusions:  remdesivir 100 mg in sodium chloride 0.9 % 100 mL IVPB 100 mg (04/12/22 0852)     LOS: 3 days    Time spent: 35 mins     Wyvonnia Dusky, MD [facility] Pager [phone]  If 7PM-7AM, please contact  night-coverage [email or web address] 04/13/2022, 7:27 AM

## 2022-04-14 DIAGNOSIS — F0390 Unspecified dementia without behavioral disturbance: Secondary | ICD-10-CM | POA: Diagnosis not present

## 2022-04-14 DIAGNOSIS — R531 Weakness: Secondary | ICD-10-CM

## 2022-04-14 DIAGNOSIS — U071 COVID-19: Secondary | ICD-10-CM | POA: Diagnosis not present

## 2022-04-14 LAB — COMPREHENSIVE METABOLIC PANEL
ALT: 31 U/L (ref 0–44)
AST: 37 U/L (ref 15–41)
Albumin: 3 g/dL — ABNORMAL LOW (ref 3.5–5.0)
Alkaline Phosphatase: 54 U/L (ref 38–126)
Anion gap: 5 (ref 5–15)
BUN: 67 mg/dL — ABNORMAL HIGH (ref 8–23)
CO2: 20 mmol/L — ABNORMAL LOW (ref 22–32)
Calcium: 8.5 mg/dL — ABNORMAL LOW (ref 8.9–10.3)
Chloride: 113 mmol/L — ABNORMAL HIGH (ref 98–111)
Creatinine, Ser: 1.73 mg/dL — ABNORMAL HIGH (ref 0.61–1.24)
GFR, Estimated: 38 mL/min — ABNORMAL LOW (ref >60)
Glucose, Bld: 204 mg/dL — ABNORMAL HIGH (ref 70–99)
Potassium: 4.2 mmol/L (ref 3.5–5.1)
Sodium: 138 mmol/L (ref 135–145)
Total Bilirubin: 0.6 mg/dL (ref 0.3–1.2)
Total Protein: 6.3 g/dL — ABNORMAL LOW (ref 6.5–8.1)

## 2022-04-14 LAB — CBC WITH DIFFERENTIAL/PLATELET
Abs Immature Granulocytes: 0.05 10*3/uL (ref 0.00–0.07)
Basophils Absolute: 0 10*3/uL (ref 0.0–0.1)
Basophils Relative: 0 %
Eosinophils Absolute: 0 10*3/uL (ref 0.0–0.5)
Eosinophils Relative: 0 %
HCT: 30.9 % — ABNORMAL LOW (ref 39.0–52.0)
Hemoglobin: 10.3 g/dL — ABNORMAL LOW (ref 13.0–17.0)
Immature Granulocytes: 1 %
Lymphocytes Relative: 11 %
Lymphs Abs: 0.8 10*3/uL (ref 0.7–4.0)
MCH: 33.7 pg (ref 26.0–34.0)
MCHC: 33.3 g/dL (ref 30.0–36.0)
MCV: 101 fL — ABNORMAL HIGH (ref 80.0–100.0)
Monocytes Absolute: 0.5 10*3/uL (ref 0.1–1.0)
Monocytes Relative: 6 %
Neutro Abs: 6.2 10*3/uL (ref 1.7–7.7)
Neutrophils Relative %: 82 %
Platelets: 102 10*3/uL — ABNORMAL LOW (ref 150–400)
RBC: 3.06 MIL/uL — ABNORMAL LOW (ref 4.22–5.81)
RDW: 14.9 % (ref 11.5–15.5)
WBC: 7.5 10*3/uL (ref 4.0–10.5)
nRBC: 0.3 % — ABNORMAL HIGH (ref 0.0–0.2)

## 2022-04-14 LAB — CULTURE, BLOOD (ROUTINE X 2)
Culture: NO GROWTH
Culture: NO GROWTH

## 2022-04-14 LAB — GLUCOSE, CAPILLARY
Glucose-Capillary: 185 mg/dL — ABNORMAL HIGH (ref 70–99)
Glucose-Capillary: 256 mg/dL — ABNORMAL HIGH (ref 70–99)
Glucose-Capillary: 257 mg/dL — ABNORMAL HIGH (ref 70–99)
Glucose-Capillary: 214 mg/dL — ABNORMAL HIGH (ref 70–99)

## 2022-04-14 LAB — C-REACTIVE PROTEIN: CRP: 1.8 mg/dL — ABNORMAL HIGH (ref <1.0)

## 2022-04-14 MED ORDER — PANTOPRAZOLE SODIUM 40 MG PO TBEC
40.0000 mg | DELAYED_RELEASE_TABLET | Freq: Two times a day (BID) | ORAL | Status: DC
  Administered 2022-04-14 – 2022-04-20 (×12): 40 mg via ORAL
  Filled 2022-04-14 (×12): qty 1

## 2022-04-14 MED ORDER — HYDRALAZINE HCL 50 MG PO TABS
50.0000 mg | ORAL_TABLET | Freq: Four times a day (QID) | ORAL | Status: DC | PRN
  Administered 2022-04-17 – 2022-04-18 (×2): 50 mg via ORAL
  Filled 2022-04-14 (×2): qty 1

## 2022-04-14 MED ORDER — DIPHENHYDRAMINE HCL 25 MG PO CAPS
25.0000 mg | ORAL_CAPSULE | Freq: Every day | ORAL | Status: DC
  Administered 2022-04-14 – 2022-04-19 (×6): 25 mg via ORAL
  Filled 2022-04-14 (×6): qty 1

## 2022-04-14 NOTE — Progress Notes (Addendum)
PROGRESS NOTE    Alex Smith  MRN:[id number] DOB: 02/15/1935 DOA: 04/10/2022 PCP: [facility]  Assessment & Plan:   Principal Problem:   COVID-19  Assessment and Plan: COVID19: no pneumonia seen on CXR of CTA chest. CTA ches neg for PE. Completed remdesivir. Continue on steroid taper, bronchodilators & encourage incentive spirometry   Acute hypoxic respiratory failure: secondary to above. Weaned off of supplemental oxygen. Resolved  Generalized weakness: PT/OT recs SNF. Will need to quarantine for 10 days before pt can d/c to SNF   Likely AKI on CKD: unknown baseline Cr/GFR, currently stage IIIb. Continue to hold lisinopril   Alzheimer's dementia: continue on on aricept   Hairy cell leukemia: last received chemo in July 2016. In remission. 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 Likely etiology of thrombocytopenia & anemia   Multiple myeloma: last received treatment in 2017  HTN: continue on cardura, amlodipine (increased dose on 8/26). Holding ACE-I secondary to Cr trending up   continue on home dose of cardura and increased amlodipine to 10mg. Holding lisinopril as Cr is trending up   DM2: likely poorly controlled. Continue on glargine, SSI w/ accuchecks. Likely worse than baseline secondary to steroid use  GERD: continue on PPI   Thrombocytopenia: labile. Likely secondary to MDS    Obesity: BMI 34.3. Complicates overall care & prognosis   DVT prophylaxis: lovenox  Code Status: full  Family Communication: discussed pt's care w/ pt's family and answered their questions  Disposition Plan: d/c to SNF but has to complete a 10 day quarantine for COVID19 prior   Level of care: Med-Surg  Status is: Inpatient Remains inpatient appropriate because: has to complete a 10 day quarantine for COVID19 prior to d/c    Consultants:    Procedures:   Antimicrobials:    Subjective: Pt c/o fatigue    Objective: Vitals:   04/13/22 1744 04/13/22 1949 04/14/22 0504 04/14/22 0729  BP: (!) 183/73 (!) 153/62 (!) 150/78 (!) 182/83  Pulse: (!) 106 100 91 84  Resp: 17 18 18 18  Temp: 98.5 F (36.9 C) 97.6 F (36.4 C) 98.1 F (36.7 C) 98.3 F (36.8 C)  TempSrc:  Oral Oral   SpO2: 98% 96% 98% 98%  Weight:      Height:       No intake or output data in the 24 hours ending 04/14/22 0730  Filed Weights   04/10/22 1421  Weight: 93.5 kg    Examination:  General exam: Appears calm & comfortable  Respiratory system: decreased breath sounds b/l  Cardiovascular system: S1/S2+. No rubs or gallops Gastrointestinal system: Abd is soft, NT, obese & normal bowel sounds  Central nervous system: alert and awake. Moves all extremities  Psychiatry:  judgement and insight appears not at baseline. Flat mood and affect    Data Reviewed: I have personally reviewed following labs and imaging studies  CBC: Recent Labs  Lab 04/09/22 2150 04/10/22 1735 04/11/22 0550 04/12/22 0502 04/13/22 0540  WBC 3.7* 3.8* 4.0 6.7 7.9  NEUTROABS 2.7 2.6 3.1 5.4 6.6  HGB 10.5* 10.5* 10.6* 10.6* 10.2*  HCT 32.1* 32.4* 32.4* 31.7* 30.4*  MCV 102.2* 104.9* 102.9* 101.6* 99.3  PLT 103* 92* 96* 109* 102*   Basic Metabolic Panel: Recent Labs  Lab 04/09/22 2150 04/10/22 1735 04/11/22 0550 04/12/22 0502 04/13/22 0540  NA 136 138 139 137 137  K 4.1 4.0 4.0 4.8 4.3  CL 108 113*   110 111 112*  CO2 22 22 23 22 20*  GLUCOSE 192* 167* 227* 327* 243*  BUN 26* 23 22 45* 60*  CREATININE 1.30* 1.31* 1.20 1.38* 1.63*  CALCIUM 8.6* 8.8* 8.6* 8.8* 8.8*   GFR: Estimated Creatinine Clearance: 34.8 mL/min (A) (by C-G formula based on SCr of 1.63 mg/dL (H)). Liver Function Tests: Recent Labs  Lab 04/09/22 0936 04/09/22 2150 04/11/22 0550 04/12/22 0502 04/13/22 0540  AST 14* 15 27 23 42*  ALT 11 11 14 14 23  ALKPHOS 71 73 68 63 57  BILITOT 0.6 0.9 0.9 0.6 0.7  PROT 7.1 7.3 6.9 6.5 6.4*  ALBUMIN 3.7 3.8  3.4* 3.1* 3.1*   No results for input(s): "LIPASE", "AMYLASE" in the last 168 hours. No results for input(s): "AMMONIA" in the last 168 hours. Coagulation Profile: Recent Labs  Lab 04/09/22 2150  INR 1.0   Cardiac Enzymes: No results for input(s): "CKTOTAL", "CKMB", "CKMBINDEX", "TROPONINI" in the last 168 hours. BNP (last 3 results) No results for input(s): "PROBNP" in the last 8760 hours. HbA1C: No results for input(s): "HGBA1C" in the last 72 hours. CBG: Recent Labs  Lab 04/13/22 0801 04/13/22 1205 04/13/22 1206 04/13/22 1746 04/13/22 2021  GLUCAP 244* 382* 385* 321* 343*   Lipid Profile: No results for input(s): "CHOL", "HDL", "LDLCALC", "TRIG", "CHOLHDL", "LDLDIRECT" in the last 72 hours. Thyroid Function Tests: No results for input(s): "TSH", "T4TOTAL", "FREET4", "T3FREE", "THYROIDAB" in the last 72 hours. Anemia Panel: No results for input(s): "VITAMINB12", "FOLATE", "FERRITIN", "TIBC", "IRON", "RETICCTPCT" in the last 72 hours.  Sepsis Labs: Recent Labs  Lab 04/09/22 2150 04/10/22 1945 04/10/22 2129 04/11/22 0043  PROCALCITON <0.10  --   --  0.27  LATICACIDVEN 1.4 1.4 1.1  --     Recent Results (from the past 240 hour(s))  Resp Panel by RT-PCR (Flu A&B, Covid) Anterior Nasal Swab     Status: Abnormal   Collection Time: 04/09/22  9:50 PM   Specimen: Anterior Nasal Swab  Result Value Ref Range Status   SARS Coronavirus 2 by RT PCR POSITIVE (A) NEGATIVE Final    Comment: (NOTE) SARS-CoV-2 target nucleic acids are DETECTED.  The SARS-CoV-2 RNA is generally detectable in upper respiratory specimens during the acute phase of infection. Positive results are indicative of the presence of the identified virus, but do not rule out bacterial infection or co-infection with other pathogens not detected by the test. Clinical correlation with patient history and other diagnostic information is necessary to determine patient infection status. The expected result is  Negative.  Fact Sheet for Patients: https://www.fda.gov/media/152166/download  Fact Sheet for Healthcare Providers: https://www.fda.gov/media/152162/download  This test is not yet approved or cleared by the [place] FDA and  has been authorized for detection and/or diagnosis of SARS-CoV-2 by FDA under an Emergency Use Authorization (EUA).  This EUA will remain in effect (meaning this test can be used) for the duration of  the COVID-19 declaration under Section 564(b)(1) of the A ct, 21 U.S.C. section 360bbb-3(b)(1), unless the authorization is terminated or revoked sooner.     Influenza A by PCR NEGATIVE NEGATIVE Final   Influenza B by PCR NEGATIVE NEGATIVE Final    Comment: (NOTE) The Xpert Xpress SARS-CoV-2/FLU/RSV plus assay is intended as an aid in the diagnosis of influenza from Nasopharyngeal swab specimens and should not be used as a sole basis for treatment. Nasal washings and aspirates are unacceptable for Xpert Xpress SARS-CoV-2/FLU/RSV testing.  Fact Sheet for Patients: https://www.fda.gov/media/152166/download  Fact Sheet   for Healthcare Providers: https://www.fda.gov/media/152162/download  This test is not yet approved or cleared by the [place] FDA and has been authorized for detection and/or diagnosis of SARS-CoV-2 by FDA under an Emergency Use Authorization (EUA). This EUA will remain in effect (meaning this test can be used) for the duration of the COVID-19 declaration under Section 564(b)(1) of the Act, 21 U.S.C. section 360bbb-3(b)(1), unless the authorization is terminated or revoked.  Performed at [facility], [address] [place], [place] [address]   Blood Culture (routine x 2)     Status: None   Collection Time: 04/09/22  9:50 PM   Specimen: BLOOD  Result Value Ref Range Status   Specimen Description BLOOD LEFT ASSIST CONTROL  Final   Special Requests   Final    BOTTLES DRAWN AEROBIC AND ANAEROBIC Blood Culture results may  not be optimal due to an excessive volume of blood received in culture bottles   Culture   Final    NO GROWTH 5 DAYS Performed at [facility], [address] [place], [place] [address]    Report Status 04/14/2022 FINAL  Final  Blood Culture (routine x 2)     Status: None   Collection Time: 04/09/22  9:50 PM   Specimen: BLOOD  Result Value Ref Range Status   Specimen Description BLOOD RIGHT FOREARM  Final   Special Requests   Final    BOTTLES DRAWN AEROBIC AND ANAEROBIC Blood Culture results may not be optimal due to an excessive volume of blood received in culture bottles   Culture   Final    NO GROWTH 5 DAYS Performed at [facility], [address] [place], [place] [address]    Report Status 04/14/2022 FINAL  Final  Urine Culture     Status: None   Collection Time: 04/09/22  9:50 PM   Specimen: Urine, Random  Result Value Ref Range Status   Specimen Description   Final    URINE, RANDOM Performed at [facility], [address] [place], [place] [address]    Special Requests   Final    NONE Performed at [facility], [address] [place], [place] [address]    Culture   Final    NO GROWTH Performed at [facility], [address] [place], [place] [address]    Report Status 04/11/2022 FINAL  Final         Radiology Studies: No results found.      Scheduled Meds:  amLODipine  10 mg Oral Daily   calcium carbonate  400 mg of elemental calcium Oral BID   dexamethasone  4 mg Oral Q breakfast   doxazosin  1 mg Oral Daily   enoxaparin (LOVENOX) injection  0.5 mg/kg Subcutaneous Q24H   insulin aspart  0-20 Units Subcutaneous TID WC   insulin aspart  0-5 Units Subcutaneous QHS   insulin glargine-yfgn  10 Units Subcutaneous QHS   pantoprazole  40 mg Oral Daily   Continuous Infusions:     LOS: 4 days    Time spent: 25 mins      M , MD [facility] Pager [phone]  If 7PM-7AM,  please contact night-coverage [email or web address] 04/14/2022, 7:30 AM  

## 2022-04-15 ENCOUNTER — Inpatient Hospital Stay: Payer: Medicare Other

## 2022-04-15 DIAGNOSIS — R531 Weakness: Secondary | ICD-10-CM | POA: Diagnosis not present

## 2022-04-15 DIAGNOSIS — U071 COVID-19: Secondary | ICD-10-CM | POA: Diagnosis not present

## 2022-04-15 DIAGNOSIS — F0390 Unspecified dementia without behavioral disturbance: Secondary | ICD-10-CM | POA: Diagnosis not present

## 2022-04-15 LAB — CBC WITH DIFFERENTIAL/PLATELET
Abs Immature Granulocytes: 0.06 10*3/uL (ref 0.00–0.07)
Basophils Absolute: 0 10*3/uL (ref 0.0–0.1)
Basophils Relative: 0 %
Eosinophils Absolute: 0 10*3/uL (ref 0.0–0.5)
Eosinophils Relative: 0 %
HCT: 31.6 % — ABNORMAL LOW (ref 39.0–52.0)
Hemoglobin: 10.3 g/dL — ABNORMAL LOW (ref 13.0–17.0)
Immature Granulocytes: 1 %
Lymphocytes Relative: 15 %
Lymphs Abs: 1.1 10*3/uL (ref 0.7–4.0)
MCH: 32.7 pg (ref 26.0–34.0)
MCHC: 32.6 g/dL (ref 30.0–36.0)
MCV: 100.3 fL — ABNORMAL HIGH (ref 80.0–100.0)
Monocytes Absolute: 0.6 10*3/uL (ref 0.1–1.0)
Monocytes Relative: 8 %
Neutro Abs: 5.5 10*3/uL (ref 1.7–7.7)
Neutrophils Relative %: 76 %
Platelets: 110 10*3/uL — ABNORMAL LOW (ref 150–400)
RBC: 3.15 MIL/uL — ABNORMAL LOW (ref 4.22–5.81)
RDW: 14.9 % (ref 11.5–15.5)
WBC: 7.2 10*3/uL (ref 4.0–10.5)
nRBC: 0.4 % — ABNORMAL HIGH (ref 0.0–0.2)

## 2022-04-15 LAB — COMPREHENSIVE METABOLIC PANEL
ALT: 33 U/L (ref 0–44)
AST: 26 U/L (ref 15–41)
Albumin: 3.1 g/dL — ABNORMAL LOW (ref 3.5–5.0)
Alkaline Phosphatase: 54 U/L (ref 38–126)
Anion gap: 6 (ref 5–15)
BUN: 57 mg/dL — ABNORMAL HIGH (ref 8–23)
CO2: 22 mmol/L (ref 22–32)
Calcium: 8.3 mg/dL — ABNORMAL LOW (ref 8.9–10.3)
Chloride: 109 mmol/L (ref 98–111)
Creatinine, Ser: 1.44 mg/dL — ABNORMAL HIGH (ref 0.61–1.24)
GFR, Estimated: 48 mL/min — ABNORMAL LOW (ref >60)
Glucose, Bld: 195 mg/dL — ABNORMAL HIGH (ref 70–99)
Potassium: 4.2 mmol/L (ref 3.5–5.1)
Sodium: 137 mmol/L (ref 135–145)
Total Bilirubin: 1 mg/dL (ref 0.3–1.2)
Total Protein: 6.5 g/dL (ref 6.5–8.1)

## 2022-04-15 LAB — GLUCOSE, CAPILLARY
Glucose-Capillary: 167 mg/dL — ABNORMAL HIGH (ref 70–99)
Glucose-Capillary: 233 mg/dL — ABNORMAL HIGH (ref 70–99)
Glucose-Capillary: 386 mg/dL — ABNORMAL HIGH (ref 70–99)
Glucose-Capillary: 391 mg/dL — ABNORMAL HIGH (ref 70–99)

## 2022-04-15 LAB — C-REACTIVE PROTEIN: CRP: 1.1 mg/dL — ABNORMAL HIGH (ref <1.0)

## 2022-04-15 MED ORDER — DEXAMETHASONE 4 MG PO TABS
2.0000 mg | ORAL_TABLET | Freq: Every day | ORAL | Status: DC
  Administered 2022-04-16: 2 mg via ORAL
  Filled 2022-04-15: qty 1

## 2022-04-15 NOTE — Inpatient Diabetes Management (Signed)
Inpatient Diabetes Program Recommendations  AACE/ADA: New Consensus Statement on Inpatient Glycemic Control (2015)  Target Ranges:  Prepandial:   less than 140 mg/dL      Peak postprandial:   less than 180 mg/dL (1-2 hours)      Critically ill patients:  140 - 180 mg/dL    Latest Reference Range & Units 04/14/22 07:30 04/14/22 11:28 04/14/22 17:26 04/14/22 20:46  Glucose-Capillary 70 - 99 mg/dL 185 (H)  4 units Novolog  256 (H)  11 units Novolog  257 (H)  11 units Novolog  214 (H)  2 units Novolog  10 units Semglee  (H): Data is abnormally high  Latest Reference Range & Units 04/15/22 07:46 04/15/22 11:35  Glucose-Capillary 70 - 99 mg/dL 167 (H)  4 units Novolog  233 (H)  7 units Novolog   (H): Data is abnormally high   SNF DM Meds: Lantus 15 units QHS  Humalog 0-9 units TID per SSI  Januvia 50 mg Daily     Current Orders: Semglee 10 units QHS  Novolog 0-20 units TID AC + HS         MD- Note pt getting Decadron 2 mg Daily  Please consider starting Novolog Meal Coverage:   Novolog 3 units TID with meals HOLD if pt NPO HOLD if pt eats <50% meals       --Will follow patient during hospitalization--   Wyn Quaker RN, MSN, [place] Diabetes Coordinator Inpatient Glycemic Control Team Team Pager: [phone] (8a-5p)

## 2022-04-15 NOTE — Progress Notes (Signed)
Physical Therapy Treatment Patient Details Name: Alex Smith MRN: [id number] DOB: 1935/04/01 Today's Date: 04/15/2022   History of Present Illness Pt is an 87 y.o. male presenting to hospital 8/23 with c/o fever and weakness and falls; seen in ED day prior and diagnosed with COVID-19.  Pt admitted with COVID-19 and acute hypoxic respiratory failure.  PMH includes anemia, CKD, htn, DM, multiple myeloma (currently on chemotherapy), hairy cell leukemia in remission, Alzheimer's dementia.    PT Comments    Pt sleeping in bed upon PT arrival; woken with vc's; agreeable to PT session.  No c/o pain.  Intermittent posterior lean noted in sitting and more consistent posterior lean noted in standing requiring min to mod assist (and cueing) for balance.  During session pt mod assist semi-supine to sitting edge of bed; min to mod assist with transfers using RW; and min to mod assist to take steps in place (x10 reps B LE's) with B UE support on RW.  Impaired motor planning noted intermittently during sessions activities.  Will continue to focus on strengthening, balance, and progressive functional mobility per pt tolerance.   Recommendations for follow up therapy are one component of a multi-disciplinary discharge planning process, led by the attending physician.  Recommendations may be updated based on patient status, additional functional criteria and insurance authorization.  Follow Up Recommendations  Skilled nursing-short term rehab (<3 hours/day) Can patient physically be transported by private vehicle: No   Assistance Recommended at Discharge Frequent or constant Supervision/Assistance  Patient can return home with the following Assistance with cooking/housework;Assist for transportation;Help with stairs or ramp for entrance;A lot of help with walking and/or transfers;A lot of help with bathing/dressing/bathroom   Equipment Recommendations  Rolling walker (2 wheels);BSC/3in1;Wheelchair  (measurements PT);Wheelchair cushion (measurements PT)    Recommendations for Other Services OT consult     Precautions / Restrictions Precautions Precautions: Fall Restrictions Weight Bearing Restrictions: No     Mobility  Bed Mobility Overal bed mobility: Needs Assistance Bed Mobility: Supine to Sit     Supine to sit: Mod assist, HOB elevated     General bed mobility comments: assist for trunk; vc's for technique    Transfers Overall transfer level: Needs assistance Equipment used: Rolling walker (2 wheels) Transfers: Sit to/from Stand Sit to Stand: Min assist, Mod assist           General transfer comment: mod assist to stand from bed x1 trial; min assist to stand from recliner x3 trials; vc's and tactile cues for UE/LE placement, shifting weight forward, and overall technique; min to mod assist for initial standing balance d/t posterior lean    Ambulation/Gait Ambulation/Gait assistance: Min assist, Mod assist Gait Distance (Feet):  (10 small steps in place B LE's) Assistive device: Rolling walker (2 wheels)   Gait velocity: decreased     General Gait Details: posterior lean requiring cueing and assist to shift weight forward; decreased B LE foot clearance   Stairs             Wheelchair Mobility    Modified Rankin (Stroke Patients Only)       Balance Overall balance assessment: Needs assistance Sitting-balance support: No upper extremity supported, Feet supported Sitting balance-Leahy Scale: Fair Sitting balance - Comments: steady static sitting with single UE support on bed rail; intermittent posterior lean in static and dynamic sitting balance requiring min assist for balance   Standing balance support: Bilateral upper extremity supported, Reliant on assistive device for balance Standing balance-Leahy Scale: Poor Standing  balance comment: min to mod assist for standing balance d/t posterior lean                             Cognition Arousal/Alertness: Awake/alert Behavior During Therapy: WFL for tasks assessed/performed Overall Cognitive Status: History of cognitive impairments - at baseline                                 General Comments: A&O to self, hospital, some parts of situation; reported it was February 2022 (disoriented to date).  Difficulty following 1-step cues.        Exercises      General Comments  Nursing cleared pt for participation in physical therapy.  Pt agreeable to PT session.      Pertinent Vitals/Pain Pain Assessment Pain Assessment: No/denies pain Vitals (HR and O2 on room air) stable and WFL throughout treatment session.    Home Living                          Prior Function            PT Goals (current goals can now be found in the care plan section) Acute Rehab PT Goals Patient Stated Goal: to go home PT Goal Formulation: With patient Time For Goal Achievement: 04/26/22 Potential to Achieve Goals: Good Progress towards PT goals: Progressing toward goals    Frequency    Min 2X/week      PT Plan Current plan remains appropriate    Co-evaluation              AM-PAC PT "6 Clicks" Mobility   Outcome Measure  Help needed turning from your back to your side while in a flat bed without using bedrails?: A Lot Help needed moving from lying on your back to sitting on the side of a flat bed without using bedrails?: A Lot Help needed moving to and from a bed to a chair (including a wheelchair)?: A Lot Help needed standing up from a chair using your arms (e.g., wheelchair or bedside chair)?: A Lot Help needed to walk in hospital room?: Total Help needed climbing 3-5 steps with a railing? : Total 6 Click Score: 10    End of Session Equipment Utilized During Treatment: Gait belt Activity Tolerance: Patient tolerated treatment well Patient left: in chair;with call bell/phone within reach;with chair alarm set Nurse Communication:  Mobility status;Precautions;Other (comment) (pt's posterior lean) PT Visit Diagnosis: Unsteadiness on feet (R26.81);Other abnormalities of gait and mobility (R26.89);Muscle weakness (generalized) (M62.81);History of falling (Z91.81)     Time: 3241-9914 PT Time Calculation (min) (ACUTE ONLY): 32 min  Charges:  $Therapeutic Activity: 23-37 mins                     Leitha Bleak, PT 04/15/22, 5:22 PM

## 2022-04-15 NOTE — Progress Notes (Signed)
PT Cancellation Note  Patient Details Name: LEOPOLD SMYERS MRN: [id number] DOB: November 17, 1934   Cancelled Treatment:    Reason Eval/Treat Not Completed: Other (comment).  Chart reviewed.  Pt pending results of L UE [place] to r/o DVT.  Per therapy protocol for DVT's, will hold PT at this time and re-attempt PT session at a later date/time as medically appropriate.  Leitha Bleak, PT 04/15/22, 2:24 PM

## 2022-04-15 NOTE — Plan of Care (Signed)
  Problem: Respiratory: Goal: Will maintain a patent airway Outcome: Progressing   Problem: Respiratory: Goal: Complications related to the disease process, condition or treatment will be avoided or minimized Outcome: Progressing   Problem: Education: Goal: Knowledge of General Education information will improve Description: Including pain rating scale, medication(s)/side effects and non-pharmacologic comfort measures Outcome: Progressing

## 2022-04-15 NOTE — Progress Notes (Signed)
PROGRESS NOTE    Alex Smith  EKC:[id number] DOB: 03-01-1935 DOA: 04/10/2022 PCP: Orvis Brill, [facility]  Assessment & Plan:   Principal Problem:   COVID-19  Assessment and Plan: COVID19: no pneumonia seen on CXR of CTA chest. CTA ches neg for PE. Completed remdesivir. Continue on steroid taper, bronchodilators & encourage incentive spirometry   Acute hypoxic respiratory failure: secondary to above. Weaned off of supplemental oxygen. Resolved  Generalized weakness: PT/OT recs SNF. Will need to quarantine for 10 days before pt can d/c to SNF   Likely AKI on CKD: unknown baseline Cr/GFR, currently stage IIIb. Continue to hold lisinopril   Alzheimer's dementia: continue on aricept  Hairy cell leukemia: last received chemo in July 2016. In remission. 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 Likely etiology of thrombocytopenia & anemia   Multiple myeloma: last received treatment in 2017  HTN: continue on amlodipine (increased dose on 8/26), cardura. Holding ACE-I secondary to AKI on CKD  DM2: likely poorly controlled. Continue on glargine, SSI w/ accuchecks. Likely worse than baseline secondary to steroid use  GERD: continue on PPI   Thrombocytopenia: labile. Likely secondary to MDS  Obesity: BMI 34.3. Complicates overall care & prognosis    DVT prophylaxis: lovenox  Code Status: full  Family Communication: discussed pt's care w/ pt's family and answered their questions  Disposition Plan: d/c to SNF but has to complete a 10 day quarantine for [place] prior   Level of care: Med-Surg  Status is: Inpatient Remains inpatient appropriate because: has to complete a 10 day quarantine for COVID19 prior to d/c    Consultants:    Procedures:   Antimicrobials:    Subjective: Pt c/o fatigue   Objective: Vitals:   04/14/22 0729 04/14/22 1728 04/14/22 1943 04/15/22 0421  BP: (!) 182/83 (!) 151/84 (!) 152/79 (!) 161/70   Pulse: 84 87 85 85  Resp: 18 17 18 18   Temp: 98.3 F (36.8 C) 97.8 F (36.6 C) 98 F (36.7 C) 97.6 F (36.4 C)  TempSrc:   Oral   SpO2: 98% 97% 96% 96%  Weight:      Height:        Intake/Output Summary (Last 24 hours) at 04/15/2022 0736 Last data filed at 04/14/2022 1700 Gross per 24 hour  Intake --  Output 1150 ml  Net -1150 ml    Filed Weights   04/10/22 1421  Weight: 93.5 kg    Examination:  General exam: Appears comfortable  Respiratory system: diminished breath sounds b/l Cardiovascular system: S1 & S2+. No rubs or gallops  Gastrointestinal system: Abd is soft, NT, obese & normal bowel sounds Central nervous system: alert and awake. Moves all extremities  Psychiatry:  judgement and insight appears poor. Flat mood and affect    Data Reviewed: I have personally reviewed following labs and imaging studies  CBC: Recent Labs  Lab 04/11/22 0550 04/12/22 0502 04/13/22 0540 04/14/22 0710 04/15/22 0448  WBC 4.0 6.7 7.9 7.5 7.2  NEUTROABS 3.1 5.4 6.6 6.2 5.5  HGB 10.6* 10.6* 10.2* 10.3* 10.3*  HCT 32.4* 31.7* 30.4* 30.9* 31.6*  MCV 102.9* 101.6* 99.3 101.0* 100.3*  PLT 96* 109* 102* 102* 505*   Basic Metabolic Panel: Recent Labs  Lab 04/11/22 0550 04/12/22 0502 04/13/22 0540 04/14/22 0710 04/15/22 0448  NA 139 137 137 138 137  K 4.0 4.8 4.3 4.2 4.2  CL 110 111 112* 113* 109  CO2 23 22 20*  20* 22  GLUCOSE 227* 327* 243* 204* 195*  BUN 22 45* 60* 67* 57*  CREATININE 1.20 1.38* 1.63* 1.73* 1.44*  CALCIUM 8.6* 8.8* 8.8* 8.5* 8.3*   GFR: Estimated Creatinine Clearance: 39.4 mL/min (A) (by C-G formula based on SCr of 1.44 mg/dL (H)). Liver Function Tests: Recent Labs  Lab 04/11/22 0550 04/12/22 0502 04/13/22 0540 04/14/22 0710 04/15/22 0448  AST 27 23 42* 37 26  ALT 14 14 23 31  33  ALKPHOS 68 63 57 54 54  BILITOT 0.9 0.6 0.7 0.6 1.0  PROT 6.9 6.5 6.4* 6.3* 6.5  ALBUMIN 3.4* 3.1* 3.1* 3.0* 3.1*   No results for input(s): "LIPASE", "AMYLASE"  in the last 168 hours. No results for input(s): "AMMONIA" in the last 168 hours. Coagulation Profile: Recent Labs  Lab 04/09/22 2150  INR 1.0   Cardiac Enzymes: No results for input(s): "CKTOTAL", "CKMB", "CKMBINDEX", "TROPONINI" in the last 168 hours. BNP (last 3 results) No results for input(s): "PROBNP" in the last 8760 hours. HbA1C: No results for input(s): "HGBA1C" in the last 72 hours. CBG: Recent Labs  Lab 04/13/22 2021 04/14/22 0730 04/14/22 1128 04/14/22 1726 04/14/22 2046  GLUCAP 343* 185* 256* 257* 214*   Lipid Profile: No results for input(s): "CHOL", "HDL", "LDLCALC", "TRIG", "CHOLHDL", "LDLDIRECT" in the last 72 hours. Thyroid Function Tests: No results for input(s): "TSH", "T4TOTAL", "FREET4", "T3FREE", "THYROIDAB" in the last 72 hours. Anemia Panel: No results for input(s): "VITAMINB12", "FOLATE", "FERRITIN", "TIBC", "IRON", "RETICCTPCT" in the last 72 hours.  Sepsis Labs: Recent Labs  Lab 04/09/22 2150 04/10/22 1945 04/10/22 2129 04/11/22 0043  PROCALCITON <0.10  --   --  0.27  LATICACIDVEN 1.4 1.4 1.1  --     Recent Results (from the past 240 hour(s))  Resp Panel by RT-PCR (Flu A&B, Covid) Anterior Nasal Swab     Status: Abnormal   Collection Time: 04/09/22  9:50 PM   Specimen: Anterior Nasal Swab  Result Value Ref Range Status   SARS Coronavirus 2 by RT PCR POSITIVE (A) NEGATIVE Final    Comment: (NOTE) SARS-CoV-2 target nucleic acids are DETECTED.  The SARS-CoV-2 RNA is generally detectable in upper respiratory specimens during the acute phase of infection. Positive results are indicative of the presence of the identified virus, but do not rule out bacterial infection or co-infection with other pathogens not detected by the test. Clinical correlation with patient history and other diagnostic information is necessary to determine patient infection status. The expected result is Negative.  Fact Sheet for  Patients: EntrepreneurPulse.com.au  Fact Sheet for Healthcare Providers: IncredibleEmployment.be  This test is not yet approved or cleared by the [place] FDA and  has been authorized for detection and/or diagnosis of SARS-CoV-2 by FDA under an Emergency Use Authorization (EUA).  This EUA will remain in effect (meaning this test can be used) for the duration of  the COVID-19 declaration under Section 564(b)(1) of the A ct, 21 U.S.C. section 360bbb-3(b)(1), unless the authorization is terminated or revoked sooner.     Influenza A by PCR NEGATIVE NEGATIVE Final   Influenza B by PCR NEGATIVE NEGATIVE Final    Comment: (NOTE) The Xpert Xpress SARS-CoV-2/FLU/RSV plus assay is intended as an aid in the diagnosis of influenza from Nasopharyngeal swab specimens and should not be used as a sole basis for treatment. Nasal washings and aspirates are unacceptable for Xpert Xpress SARS-CoV-2/FLU/RSV testing.  Fact Sheet for Patients: EntrepreneurPulse.com.au  Fact Sheet for Healthcare Providers: IncredibleEmployment.be  This test is  not yet approved or cleared by the [place] and has been authorized for detection and/or diagnosis of SARS-CoV-2 by FDA under an Emergency Use Authorization (EUA). This EUA will remain in effect (meaning this test can be used) for the duration of the COVID-19 declaration under Section 564(b)(1) of the Act, 21 U.S.C. section 360bbb-3(b)(1), unless the authorization is terminated or revoked.  Performed at [facility], [address] [place], [place] [address]   Blood Culture (routine x 2)     Status: None   Collection Time: 04/09/22  9:50 PM   Specimen: BLOOD  Result Value Ref Range Status   Specimen Description BLOOD LEFT ASSIST CONTROL  Final   Special Requests   Final    BOTTLES DRAWN AEROBIC AND ANAEROBIC Blood Culture results may not be optimal due to an  excessive volume of blood received in culture bottles   Culture   Final    NO GROWTH 5 DAYS Performed at [facility], [address] [place], [place] [address]    Report Status 04/14/2022 FINAL  Final  Blood Culture (routine x 2)     Status: None   Collection Time: 04/09/22  9:50 PM   Specimen: BLOOD  Result Value Ref Range Status   Specimen Description BLOOD RIGHT FOREARM  Final   Special Requests   Final    BOTTLES DRAWN AEROBIC AND ANAEROBIC Blood Culture results may not be optimal due to an excessive volume of blood received in culture bottles   Culture   Final    NO GROWTH 5 DAYS Performed at [facility], [address] [place], [place] [address]    Report Status 04/14/2022 FINAL  Final  Urine Culture     Status: None   Collection Time: 04/09/22  9:50 PM   Specimen: Urine, Random  Result Value Ref Range Status   Specimen Description   Final    URINE, RANDOM Performed at [facility], [address] [place], [place] [address]    Special Requests   Final    NONE Performed at [facility], [address], [place], [place] [address]    Culture   Final    NO GROWTH Performed at [facility], [place] [address], [place], [place] [address]    Report Status 04/11/2022 FINAL  Final         Radiology Studies: No results found.      Scheduled Meds:  amLODipine  10 mg Oral Daily   calcium carbonate  400 mg of elemental calcium Oral BID   dexamethasone  4 mg Oral Q breakfast   diphenhydrAMINE  25 mg Oral QHS   doxazosin  1 mg Oral Daily   enoxaparin (LOVENOX) injection  0.5 mg/kg Subcutaneous Q24H   insulin aspart  0-20 Units Subcutaneous TID WC   insulin aspart  0-5 Units Subcutaneous QHS   insulin glargine-yfgn  10 Units Subcutaneous QHS   pantoprazole  40 mg Oral BID   Continuous Infusions:     LOS: 5 days    Time spent: 25 mins     Wyvonnia Dusky, MD [facility] Pager [phone]  If  7PM-7AM, please contact night-coverage [email or web address] 04/15/2022, 7:36 AM

## 2022-04-16 DIAGNOSIS — J9601 Acute respiratory failure with hypoxia: Secondary | ICD-10-CM

## 2022-04-16 DIAGNOSIS — N1832 Chronic kidney disease, stage 3b: Secondary | ICD-10-CM

## 2022-04-16 DIAGNOSIS — U071 COVID-19: Secondary | ICD-10-CM | POA: Diagnosis not present

## 2022-04-16 DIAGNOSIS — F0390 Unspecified dementia without behavioral disturbance: Secondary | ICD-10-CM | POA: Diagnosis not present

## 2022-04-16 DIAGNOSIS — R531 Weakness: Secondary | ICD-10-CM

## 2022-04-16 LAB — CBC
HCT: 32.4 % — ABNORMAL LOW (ref 39.0–52.0)
Hemoglobin: 10.6 g/dL — ABNORMAL LOW (ref 13.0–17.0)
MCH: 32.6 pg (ref 26.0–34.0)
MCHC: 32.7 g/dL (ref 30.0–36.0)
MCV: 99.7 fL (ref 80.0–100.0)
Platelets: 109 10*3/uL — ABNORMAL LOW (ref 150–400)
RBC: 3.25 MIL/uL — ABNORMAL LOW (ref 4.22–5.81)
RDW: 14.4 % (ref 11.5–15.5)
WBC: 5.7 10*3/uL (ref 4.0–10.5)
nRBC: 0.5 % — ABNORMAL HIGH (ref 0.0–0.2)

## 2022-04-16 LAB — GLUCOSE, CAPILLARY
Glucose-Capillary: 130 mg/dL — ABNORMAL HIGH (ref 70–99)
Glucose-Capillary: 259 mg/dL — ABNORMAL HIGH (ref 70–99)
Glucose-Capillary: 300 mg/dL — ABNORMAL HIGH (ref 70–99)
Glucose-Capillary: 183 mg/dL — ABNORMAL HIGH (ref 70–99)

## 2022-04-16 LAB — BASIC METABOLIC PANEL
Anion gap: 3 — ABNORMAL LOW (ref 5–15)
BUN: 49 mg/dL — ABNORMAL HIGH (ref 8–23)
CO2: 23 mmol/L (ref 22–32)
Calcium: 8.4 mg/dL — ABNORMAL LOW (ref 8.9–10.3)
Chloride: 114 mmol/L — ABNORMAL HIGH (ref 98–111)
Creatinine, Ser: 1.55 mg/dL — ABNORMAL HIGH (ref 0.61–1.24)
GFR, Estimated: 44 mL/min — ABNORMAL LOW (ref >60)
Glucose, Bld: 117 mg/dL — ABNORMAL HIGH (ref 70–99)
Potassium: 3.9 mmol/L (ref 3.5–5.1)
Sodium: 140 mmol/L (ref 135–145)

## 2022-04-16 MED ORDER — DEXAMETHASONE 4 MG PO TABS
2.0000 mg | ORAL_TABLET | Freq: Every day | ORAL | Status: AC
Start: 2022-04-17 — End: 2022-04-18
  Administered 2022-04-17 – 2022-04-18 (×2): 2 mg via ORAL
  Filled 2022-04-16 (×2): qty 1

## 2022-04-16 MED ORDER — CALCIUM CARBONATE ANTACID 500 MG PO CHEW
800.0000 mg | CHEWABLE_TABLET | Freq: Two times a day (BID) | ORAL | Status: DC
  Administered 2022-04-16 – 2022-04-20 (×8): 800 mg via ORAL
  Filled 2022-04-16 (×8): qty 4

## 2022-04-16 NOTE — Progress Notes (Signed)
Occupational Therapy Treatment Patient Details Name: Alex Smith MRN: [id number] DOB: 1934-10-27 Today's Date: 04/16/2022   History of present illness Pt is an 87 y.o. male presenting to hospital 8/23 with c/o fever and weakness and falls; seen in ED day prior and diagnosed with COVID-19.  Pt admitted with COVID-19 and acute hypoxic respiratory failure.  PMH includes anemia, CKD, htn, DM, multiple myeloma (currently on chemotherapy), hairy cell leukemia in remission, Alzheimer's dementia.   OT comments  Alex Smith was seen for OT treatment on this date. Upon arrival to room pt reclined in bed and states "bad news, I've had a bowel movement". Pt states he did not press call bell because it was too late and he already went - educated on use of call bell to notify of accidents / request assistance. Pt requires MIN A rolling at bed level for MAX A pericare, good tolerance maintaining side lying. Pt cites fatigue following toileting, defers further mobility. Left with all needs in reach, bed alarm on. Pt making progress toward goals, will continue to follow POC. Discharge recommendation remains appropriate.     Recommendations for follow up therapy are one component of a multi-disciplinary discharge planning process, led by the attending physician.  Recommendations may be updated based on patient status, additional functional criteria and insurance authorization.    Follow Up Recommendations  Skilled nursing-short term rehab (<3 hours/day)    Assistance Recommended at Discharge Frequent or constant Supervision/Assistance  Patient can return home with the following  Two people to help with walking and/or transfers;A lot of help with bathing/dressing/bathroom   Equipment Recommendations  Other (comment) (defer)    Recommendations for Other Services      Precautions / Restrictions Precautions Precautions: Fall Restrictions Weight Bearing Restrictions: No       Mobility Bed  Mobility Overal bed mobility: Needs Assistance Bed Mobility: Rolling Rolling: Min assist              Transfers                   General transfer comment: pt defered citing fatigue after toileting         ADL either performed or assessed with clinical judgement   ADL Overall ADL's : Needs assistance/impaired                                       General ADL Comments: MIN A rolling at bed level for MAX A pericare.      Cognition Arousal/Alertness: Awake/alert Behavior During Therapy: WFL for tasks assessed/performed Overall Cognitive Status: History of cognitive impairments - at baseline                                 General Comments: pt reports being soiled, did not call for assistance to be cleaned up - re-oriented to call bell use                   Pertinent Vitals/ Pain       Pain Assessment Pain Assessment: No/denies pain   Frequency  Min 2X/week        Progress Toward Goals  OT Goals(current goals can now be found in the care plan section)  Progress towards OT goals: Progressing toward goals  Acute Rehab OT Goals Patient Stated Goal: to get better OT  Goal Formulation: With patient Time For Goal Achievement: 04/26/22 Potential to Achieve Goals: Fair ADL Goals Pt Will Perform Grooming: with set-up;with supervision;sitting Pt Will Perform Upper Body Dressing: sitting Pt Will Transfer to Toilet: with mod assist;stand pivot transfer;bedside commode  Plan Discharge plan remains appropriate;Frequency remains appropriate    Co-evaluation                 AM-PAC OT "6 Clicks" Daily Activity     Outcome Measure   Help from another person eating meals?: None Help from another person taking care of personal grooming?: A Little Help from another person toileting, which includes using toliet, bedpan, or urinal?: A Lot Help from another person bathing (including washing, rinsing, drying)?: A Lot Help  from another person to put on and taking off regular upper body clothing?: A Little Help from another person to put on and taking off regular lower body clothing?: A Lot 6 Click Score: 16    End of Session    OT Visit Diagnosis: Unsteadiness on feet (R26.81);Muscle weakness (generalized) (M62.81);Repeated falls (R29.6)   Activity Tolerance Patient tolerated treatment well   Patient Left in bed;with call bell/phone within reach;with bed alarm set   Nurse Communication          Time: 3953-2023 OT Time Calculation (min): 13 min  Charges: OT General Charges $OT Visit: 1 Visit OT Treatments $Self Care/Home Management : 8-22 mins  Dessie Coma, M.S. OTR/L  04/16/22, 3:35 PM  ascom [phone]

## 2022-04-16 NOTE — Inpatient Diabetes Management (Addendum)
Inpatient Diabetes Program Recommendations  AACE/ADA: New Consensus Statement on Inpatient Glycemic Control   Target Ranges:  Prepandial:   less than 140 mg/dL      Peak postprandial:   less than 180 mg/dL (1-2 hours)      Critically ill patients:  140 - 180 mg/dL    Latest Reference Range & Units 04/16/22 09:08 04/16/22 12:13  Glucose-Capillary 70 - 99 mg/dL 130 (H) 259 (H)    Latest Reference Range & Units 04/15/22 07:46 04/15/22 11:35 04/15/22 17:18 04/15/22 21:24  Glucose-Capillary 70 - 99 mg/dL 167 (H) 233 (H) 386 (H) 391 (H)   Review of Glycemic Control  Diabetes history: DM2 Outpatient Diabetes medications: Humalog 0-9 units with meals, Lantus 15 units QHS, Januvia 50 mg daily Current orders for Inpatient glycemic control: Semglee 10 units QHS, Novolog 0-20 units TID with meals, Novolog 0-5 units; Decadron 2 mg QAM  Inpatient Diabetes Program Recommendations:    Insulin: If steroids are continued as ordered, please consider ordering Novolog 5 units TID with meals for meal coverage if patient eats at least 50% of meals.  Thanks, Barnie Alderman, RN, MSN, [place] Diabetes Coordinator Inpatient Diabetes Program [phone] (Team Pager from 8am to [place])

## 2022-04-16 NOTE — Progress Notes (Signed)
PROGRESS NOTE   HPI was taken from Dr. Marcello Moores: Alex Smith is a 87 y.o. 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 He was discharged home with supportive care, no antivirals were prescribed at that time.  Patient now returns to ed 12 hours later from ALF via EMS with increase weakness and falls for further evaluation.  Please note history mostly given by son. ON further ros patient noted no n/v/d/dysuria/ increase sob but notes cough, persistent fever/fatigue generalized malaise and confusion.  ED Course:  Vitals: temp 103.1 bp 182/79, hr 107, rr 16  Sat 88% Wbc 3.8, hgb 10.5,plt 92 Lactic 1.4 Na 138, K 4.0,glu 167, cr 1.3 base 1.1 Ce 10 EKG: snr pvc Procal < .10 Tx LR 1L, zofran  As per Dr. Jimmye Norman 8/24-8/29/23: Pt presented to the ER w/ fever and fatigue and was found to have COVID19 infection. No pneumonia was seen on CXR or CTA chest. CTA chest was neg for PE. Pt has completed remdesivir course and is on a steroid taper. PT/OT evaluated pt and recs SNF. Pt has to complete a 10 day quarantine prior to d/c to SNF. Pt was positive for COVID19 on 04/09/22.      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 CTA ches neg for PE. Completed remdesivir. Continue on steroid taper, bronchodilators & encourage incentive spirometry   Acute hypoxic respiratory failure: secondary to above. Weaned off of supplemental oxygen. Resolved  Generalized weakness: OT/PT recs SNF. Will need to quarantine for 10 days prior to d/c to SNF  Likely AKI on CKD: unknown baseline Cr/GFR, currently stage IIIb. Cr is labile. Holding ACE-I   Alzheimer's dementia: continue on aricept  Hairy cell leukemia: last received chemo in July 2016. In remission. 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 Likely etiology of anemia and thrombocytopenia   Multiple myeloma: last received treatment in 2017  HTN: continue on amlodipine (increased dose on 8/26), cardura. Holding ACE-I secondary to AKI on CKD   DM2: likely poorly controlled. Continue on glargine, SSI w/ accuchecks. Likely worse than baseline secondary to steroid use  GERD: continue on PPI   Thrombocytopenia: likely secondary to MDS. Labile   Obesity: BMI 34.3. Complicates overall care & prognosis   DVT prophylaxis: lovenox  Code Status: full  Family Communication: discussed pt's care w/ pt's family and  answered their questions  Disposition Plan: d/c to SNF but has to complete a 10 day quarantine for COVID19 prior   Level of care: Med-Surg  Status is: Inpatient Remains inpatient appropriate because: has to complete a 10 day quarantine for COVID19 prior to d/c    Consultants:    Procedures:   Antimicrobials:    Subjective: Pt denies any complaints   Objective: Vitals:   04/15/22 1421 04/15/22  2023 04/15/22 2024 04/15/22 2025  BP: (!) 157/78 (!) 165/85 (!) 167/79 (!) 157/79  Pulse: 91 99 98 96  Resp: 16 17    Temp: 97.7 F (36.5 C) 97.9 F (36.6 C)    TempSrc:      SpO2: 96% 97%    Weight:      Height:        Intake/Output Summary (Last 24 hours) at 04/16/2022 1258 Last data filed at 04/16/2022 0093 Gross per 24 hour  Intake 100 ml  Output 1350 ml  Net -1250 ml    Filed Weights   04/10/22 1421  Weight: 93.5 kg    Examination:  General exam: Appears calm & comfortable  Respiratory system: decreased breath sounds b/l Cardiovascular system: S1/S2+. No rubs or gallops  Gastrointestinal system: Abd is soft, NT, obese & normal bowel sounds  Central nervous system: Alert and oriented. Moves all extremities Psychiatry: Judgement and insight appears poor. Flat mood and affect     Data Reviewed: I have personally reviewed following labs and imaging studies  CBC: Recent Labs  Lab 04/11/22 0550 04/12/22 0502 04/13/22 0540 04/14/22 0710 04/15/22 0448 04/16/22 0543  WBC 4.0 6.7 7.9 7.5 7.2 5.7  NEUTROABS 3.1 5.4 6.6 6.2 5.5  --   HGB 10.6* 10.6* 10.2* 10.3* 10.3* 10.6*  HCT 32.4* 31.7* 30.4* 30.9* 31.6* 32.4*  MCV 102.9* 101.6* 99.3 101.0* 100.3* 99.7  PLT 96* 109* 102* 102* 110* 818*   Basic Metabolic Panel: Recent Labs  Lab 04/12/22 0502 04/13/22 0540 04/14/22 0710 04/15/22 0448 04/16/22 0543  NA 137 137 138 137 140  K 4.8 4.3 4.2 4.2 3.9  CL 111 112* 113* 109 114*  CO2 22 20* 20* 22 23  GLUCOSE 327* 243* 204* 195* 117*  BUN 45* 60* 67* 57* 49*  CREATININE 1.38* 1.63* 1.73* 1.44* 1.55*  CALCIUM 8.8* 8.8* 8.5* 8.3* 8.4*   GFR: Estimated Creatinine Clearance: 36.6 mL/min (A) (by C-G formula based on SCr of 1.55 mg/dL (H)). Liver Function Tests: Recent Labs  Lab 04/11/22 0550 04/12/22 0502 04/13/22 0540 04/14/22 0710 04/15/22 0448  AST 27 23 42* 37 26  ALT _0 33  ALKPHOS 68 63 57 54 54  BILITOT 0.9 0.6 0.7 0.6 1.0  PROT 6.9 6.5  6.4* 6.3* 6.5  ALBUMIN 3.4* 3.1* 3.1* 3.0* 3.1*   No results for input(s): "LIPASE", "AMYLASE" in the last 168 hours. No results for input(s): "AMMONIA" in the last 168 hours. Coagulation Profile: Recent Labs  Lab 04/09/22 2150  INR 1.0   Cardiac Enzymes: No results for input(s): "CKTOTAL", "CKMB", "CKMBINDEX", "TROPONINI" in the last 168 hours. BNP (last 3 results) No results for input(s): "PROBNP" in the last 8760 hours. HbA1C: No results for input(s): "HGBA1C" in the last 72 hours. CBG: Recent Labs  Lab 04/15/22 1135 04/15/22 1718 04/15/22 2124 04/16/22 0908 04/16/22 1213  GLUCAP 233* 386* 391* 130* 259*   Lipid Profile: No results for input(s): "CHOL", "HDL", "LDLCALC", "TRIG", "CHOLHDL", "LDLDIRECT" in the last 72 hours. Thyroid Function Tests: No results  for input(s): "TSH", "T4TOTAL", "FREET4", "T3FREE", "THYROIDAB" in the last 72 hours. Anemia Panel: No results for input(s): "VITAMINB12", "FOLATE", "FERRITIN", "TIBC", "IRON", "RETICCTPCT" in the last 72 hours.  Sepsis Labs: Recent Labs  Lab 04/09/22 2150 04/10/22 1945 04/10/22 2129 04/11/22 0043  PROCALCITON <0.10  --   --  0.27  LATICACIDVEN 1.4 1.4 1.1  --     Recent Results (from the past 240 hour(s))  Resp Panel by RT-PCR (Flu A&B, Covid) Anterior Nasal Swab     Status: Abnormal   Collection Time: 04/09/22  9:50 PM   Specimen: Anterior Nasal Swab  Result Value Ref Range Status   SARS Coronavirus 2 by RT PCR POSITIVE (A) NEGATIVE Final    Comment: (NOTE) SARS-CoV-2 target nucleic acids are DETECTED.  The SARS-CoV-2 RNA is generally detectable in upper respiratory specimens during the acute phase of infection. Positive results are indicative of the presence of the identified virus, but do not rule out bacterial infection or co-infection with other pathogens not detected by the test. Clinical correlation with patient history and other diagnostic information is necessary to determine  patient infection status. The expected result is Negative.  Fact Sheet for Patients: EntrepreneurPulse.com.au  Fact Sheet for Healthcare Providers: IncredibleEmployment.be  This test is not yet approved or cleared by the [place] FDA and  has been authorized for detection and/or diagnosis of SARS-CoV-2 by FDA under an Emergency Use Authorization (EUA).  This EUA will remain in effect (meaning this test can be used) for the duration of  the COVID-19 declaration under Section 564(b)(1) of the A ct, 21 U.S.C. section 360bbb-3(b)(1), unless the authorization is terminated or revoked sooner.     Influenza A by PCR NEGATIVE NEGATIVE Final   Influenza B by PCR NEGATIVE NEGATIVE Final    Comment: (NOTE) The Xpert Xpress SARS-CoV-2/FLU/RSV plus assay is intended as an aid in the diagnosis of influenza from Nasopharyngeal swab specimens and should not be used as a sole basis for treatment. Nasal washings and aspirates are unacceptable for Xpert Xpress SARS-CoV-2/FLU/RSV testing.  Fact Sheet for Patients: EntrepreneurPulse.com.au  Fact Sheet for Healthcare Providers: IncredibleEmployment.be  This test is not yet approved or cleared by the [place] FDA and has been authorized for detection and/or diagnosis of SARS-CoV-2 by FDA under an Emergency Use Authorization (EUA). This EUA will remain in effect (meaning this test can be used) for the duration of the COVID-19 declaration under Section 564(b)(1) of the Act, 21 U.S.C. section 360bbb-3(b)(1), unless the authorization is terminated or revoked.  Performed at [facility], [address] [place], [place] [address]   Blood Culture (routine x 2)     Status: None   Collection Time: 04/09/22  9:50 PM   Specimen: BLOOD  Result Value Ref Range Status   Specimen Description BLOOD LEFT ASSIST CONTROL  Final   Special Requests   Final    BOTTLES  DRAWN AEROBIC AND ANAEROBIC Blood Culture results may not be optimal due to an excessive volume of blood received in culture bottles   Culture   Final    NO GROWTH 5 DAYS Performed at [facility], [address] [place], [place] [address]    Report Status 04/14/2022 FINAL  Final  Blood Culture (routine x 2)     Status: None   Collection Time: 04/09/22  9:50 PM   Specimen: BLOOD  Result Value Ref Range Status   Specimen Description BLOOD RIGHT FOREARM  Final   Special Requests  Final    BOTTLES DRAWN AEROBIC AND ANAEROBIC Blood Culture results may not be optimal due to an excessive volume of blood received in culture bottles   Culture   Final    NO GROWTH 5 DAYS Performed at [facility], [address], [place], [place] [address]    Report Status 04/14/2022 FINAL  Final  Urine Culture     Status: None   Collection Time: 04/09/22  9:50 PM   Specimen: Urine, Random  Result Value Ref Range Status   Specimen Description   Final    URINE, RANDOM Performed at [facility], [address] [place], [place] [address]    Special Requests   Final    NONE Performed at [facility], [address], [place], [place] [address]    Culture   Final    NO GROWTH Performed at [facility], [place] [address] [place], [place] [address]    Report Status 04/11/2022 FINAL  Final         Radiology Studies: US Venous Img Upper Uni Left (DVT)  Result Date: 04/15/2022 CLINICAL DATA:  LEFT upper extremity swelling EXAM: LEFT UPPER EXTREMITY VENOUS DOPPLER ULTRASOUND TECHNIQUE: Gray-scale sonography with graded compression, as well as color Doppler and duplex ultrasound were performed to evaluate the upper extremity deep venous system from the level of the subclavian vein and including the jugular, axillary, basilic, radial, ulnar and upper cephalic vein. Spectral Doppler was utilized to evaluate flow at rest and with distal augmentation maneuvers.  COMPARISON:  CTA chest, 04/09/2022. FINDINGS: VENOUS Normal compressibility of the LEFT internal jugular, subclavian, axillary, cephalic, basilic, brachial, radial and ulnar veins. No filling defects to suggest DVT on grayscale or color Doppler imaging. Doppler waveforms show normal direction of venous flow, normal respiratory plasticity and response to augmentation. Limited views of the contralateral subclavian vein are unremarkable. OTHER No evidence of superficial thrombophlebitis or abnormal fluid collection. Limitations: none IMPRESSION: No evidence of DVT or superficial thrombophlebitis within the LEFT upper extremity. Michaelle Birks, MD [facility]iology Electronically Signed   By: Michaelle Birks M.D.   On: 04/15/2022 15:06        Scheduled Meds:  amLODipine  10 mg Oral Daily   calcium carbonate  400 mg of elemental calcium Oral BID   [START ON 04/17/2022] dexamethasone  2 mg Oral Q breakfast   diphenhydrAMINE  25 mg Oral QHS   doxazosin  1 mg Oral Daily   enoxaparin (LOVENOX) injection  0.5 mg/kg Subcutaneous Q24H   insulin aspart  0-20 Units Subcutaneous TID WC   insulin aspart  0-5 Units Subcutaneous QHS   insulin glargine-yfgn  10 Units Subcutaneous QHS   pantoprazole  40 mg Oral BID   Continuous Infusions:     LOS: 6 days    Time spent: 25 mins     Wyvonnia Dusky, MD [facility] Pager [phone]  If 7PM-7AM, please contact night-coverage [email or web address] 04/16/2022, 12:58 PM

## 2022-04-17 DIAGNOSIS — U071 COVID-19: Secondary | ICD-10-CM | POA: Diagnosis not present

## 2022-04-17 LAB — BASIC METABOLIC PANEL
Anion gap: 3 — ABNORMAL LOW (ref 5–15)
BUN: 38 mg/dL — ABNORMAL HIGH (ref 8–23)
CO2: 26 mmol/L (ref 22–32)
Calcium: 8.2 mg/dL — ABNORMAL LOW (ref 8.9–10.3)
Chloride: 109 mmol/L (ref 98–111)
Creatinine, Ser: 1.41 mg/dL — ABNORMAL HIGH (ref 0.61–1.24)
GFR, Estimated: 49 mL/min — ABNORMAL LOW (ref >60)
Glucose, Bld: 182 mg/dL — ABNORMAL HIGH (ref 70–99)
Potassium: 3.9 mmol/L (ref 3.5–5.1)
Sodium: 138 mmol/L (ref 135–145)

## 2022-04-17 LAB — CBC
HCT: 31.1 % — ABNORMAL LOW (ref 39.0–52.0)
Hemoglobin: 10.4 g/dL — ABNORMAL LOW (ref 13.0–17.0)
MCH: 33.2 pg (ref 26.0–34.0)
MCHC: 33.4 g/dL (ref 30.0–36.0)
MCV: 99.4 fL (ref 80.0–100.0)
Platelets: 115 10*3/uL — ABNORMAL LOW (ref 150–400)
RBC: 3.13 MIL/uL — ABNORMAL LOW (ref 4.22–5.81)
RDW: 14.4 % (ref 11.5–15.5)
WBC: 5.8 10*3/uL (ref 4.0–10.5)
nRBC: 0.5 % — ABNORMAL HIGH (ref 0.0–0.2)

## 2022-04-17 LAB — GLUCOSE, CAPILLARY
Glucose-Capillary: 193 mg/dL — ABNORMAL HIGH (ref 70–99)
Glucose-Capillary: 172 mg/dL — ABNORMAL HIGH (ref 70–99)
Glucose-Capillary: 387 mg/dL — ABNORMAL HIGH (ref 70–99)
Glucose-Capillary: 272 mg/dL — ABNORMAL HIGH (ref 70–99)
Glucose-Capillary: 197 mg/dL — ABNORMAL HIGH (ref 70–99)

## 2022-04-17 MED ORDER — ALUM & MAG HYDROXIDE-SIMETH 200-200-20 MG/5ML PO SUSP
30.0000 mL | ORAL | Status: DC | PRN
  Administered 2022-04-17 – 2022-04-18 (×2): 30 mL via ORAL
  Filled 2022-04-17 (×2): qty 30

## 2022-04-17 NOTE — Progress Notes (Signed)
Patient c/o having severe heart burn and indigestion. He has taken his scheduled and PRN meds for same with no relief. Made MD aware. No new orders at this time.

## 2022-04-17 NOTE — Progress Notes (Signed)
Physical Therapy Treatment Patient Details Name: Alex Smith MRN: [id number] DOB: [birth year] Today's Date: 04/17/2022   History of Present Illness Pt is an 87 y.o. male presenting to hospital 8/23 with c/o fever and weakness and falls; seen in ED day prior and diagnosed with COVID-19.  Pt admitted with COVID-19 and acute hypoxic respiratory failure.  PMH includes anemia, CKD, htn, DM, multiple myeloma (currently on chemotherapy), hairy cell leukemia in remission, Alzheimer's dementia.    PT Comments    Significant functional improvement and tolerance noted this session. Pt continues to require ModA to transition from supine to sitting, However able to stand from bed with Min/ModA and complete gait training in room 49f with RW and CGA. No c/o SOB, dizziness, or knee buckling. Pt appears to be turning a corner and will benefit from continued PT post d/c. Pt positioned slightly reclined in chair due to c/o indigestion, call bell and chair alarm addressed. Continue PT per POC.   Recommendations for follow up therapy are one component of a multi-disciplinary discharge planning process, led by the attending physician.  Recommendations may be updated based on patient status, additional functional criteria and insurance authorization.  Follow Up Recommendations  Skilled nursing-short term rehab (<3 hours/day) Can patient physically be transported by private vehicle: No   Assistance Recommended at Discharge Frequent or constant Supervision/Assistance  Patient can return home with the following Assistance with cooking/housework;Assist for transportation;Help with stairs or ramp for entrance;A lot of help with bathing/dressing/bathroom;A little help with walking and/or transfers   Equipment Recommendations  Rolling walker (2 wheels);BSC/3in1;Wheelchair (measurements PT);Wheelchair cushion (measurements PT)    Recommendations for Other Services OT consult     Precautions / Restrictions  Precautions Precautions: Fall Restrictions Weight Bearing Restrictions: No     Mobility  Bed Mobility Overal bed mobility: Needs Assistance Bed Mobility: Rolling Rolling: Min assist   Supine to sit: Mod assist, HOB elevated     General bed mobility comments: assist for trunk; vc's for technique    Transfers Overall transfer level: Needs assistance Equipment used: Rolling walker (2 wheels) Transfers: Sit to/from Stand Sit to Stand: Min assist, Mod assist           General transfer comment: No c/o dizziness during transfers    Ambulation/Gait Ambulation/Gait assistance: Min guard Gait Distance (Feet): 18 Feet Assistive device: Rolling walker (2 wheels) Gait Pattern/deviations: Step-to pattern, Decreased step length - right, Decreased step length - left Gait velocity: decreased     General Gait Details: no leaning or knee weakness noted   Stairs             Wheelchair Mobility    Modified Rankin (Stroke Patients Only)       Balance Overall balance assessment: Needs assistance Sitting-balance support: No upper extremity supported, Feet supported Sitting balance-Leahy Scale: Good     Standing balance support: Bilateral upper extremity supported, Reliant on assistive device for balance Standing balance-Leahy Scale: Fair Standing balance comment: Min assist initially to attain standing balance at RW then CGA                            Cognition Arousal/Alertness: Awake/alert Behavior During Therapy: WFL for tasks assessed/performed Overall Cognitive Status: History of cognitive impairments - at baseline  Exercises General Exercises - Lower Extremity Ankle Circles/Pumps: AROM, Both, 10 reps, Seated Long Arc Quad: AROM, Both, 10 reps, Seated Hip Flexion/Marching: AROM, Both, 10 reps, Seated    General Comments General comments (skin integrity, edema, etc.): Pt utilizing pure  wick for incontinence      Pertinent Vitals/Pain Pain Assessment Pain Assessment: No/denies pain    Home Living                          Prior Function            PT Goals (current goals can now be found in the care plan section) Acute Rehab PT Goals Patient Stated Goal: to go home Progress towards PT goals: Progressing toward goals    Frequency    Min 2X/week      PT Plan Current plan remains appropriate    Co-evaluation              AM-PAC PT "6 Clicks" Mobility   Outcome Measure  Help needed turning from your back to your side while in a flat bed without using bedrails?: A Lot Help needed moving from lying on your back to sitting on the side of a flat bed without using bedrails?: A Lot Help needed moving to and from a bed to a chair (including a wheelchair)?: A Lot Help needed standing up from a chair using your arms (e.g., wheelchair or bedside chair)?: A Lot Help needed to walk in hospital room?: A Lot Help needed climbing 3-5 steps with a railing? : A Lot 6 Click Score: 12    End of Session Equipment Utilized During Treatment: Gait belt Activity Tolerance: Patient tolerated treatment well Patient left: in chair;with call bell/phone within reach;with chair alarm set Nurse Communication: Mobility status PT Visit Diagnosis: Unsteadiness on feet (R26.81);Other abnormalities of gait and mobility (R26.89);Muscle weakness (generalized) (M62.81);History of falling (Z91.81)     Time: 1610-9604 PT Time Calculation (min) (ACUTE ONLY): 32 min  Charges:  $Gait Training: 8-22 mins $Therapeutic Exercise: 8-22 mins                    Mikel Cella, PTA   Josie Dixon 04/17/2022, 12:21 PM

## 2022-04-17 NOTE — Progress Notes (Signed)
PROGRESS NOTE   HPI was taken from Dr. Marcello Moores: Alex Smith is a 87 y.o. 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 He was discharged home with supportive care, no antivirals were prescribed at that time.  Patient now returns to ed 12 hours later from ALF via EMS with increase weakness and falls for further evaluation.  Please note history mostly given by son. ON further ros patient noted no n/v/d/dysuria/ increase sob but notes cough, persistent fever/fatigue generalized malaise and confusion.  ED Course:  Vitals: temp 103.1 bp 182/79, hr 107, rr 16  Sat 88% Wbc 3.8, hgb 10.5,plt 92 Lactic 1.4 Na 138, K 4.0,glu 167, cr 1.3 base 1.1 Ce 10 EKG: snr pvc Procal < .10 Tx LR 1L, zofran  As per Dr. Jimmye Norman 8/24-8/29/23: Pt presented to the ER w/ fever and fatigue and was found to have COVID19 infection. No pneumonia was seen on CXR or CTA chest. CTA chest was neg for PE. Pt has completed remdesivir course and is on a steroid taper. PT/OT evaluated pt and recs SNF. Pt has to complete a 10 day quarantine prior to d/c to SNF. Pt was positive for COVID19 on 04/09/22.   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 CTA ches neg for PE. Completed remdesivir.  8/30 continue steroids, bronchodilators, I-S    Acute hypoxic respiratory failure: secondary to above.  8/30 on room air.  Weaned off of oxygen.     Generalized weakness: OT/PT recs SNF.  8/30 needs to quarantine for 10 days prior to DC to SNF    Likely AKI on CKD: unknown baseline Cr/GFR, currently stage IIIb.  8/30 creatinine improving slowly likely close to baseline  Continue to hold ACE inhibitors    Alzheimer's dementia: continue on aricept  Hairy cell leukemia: last received chemo in July 2016. In remission. 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 Likely etiology of anemia and thrombocytopenia   Multiple myeloma: last received treatment in 2017  HTN: continue on amlodipine (increased dose on 8/26), cardura. Holding ACE-I secondary to AKI on CKD   DM2: likely poorly controlled. Continue on glargine, SSI w/ accuchecks. Likely worse than baseline secondary to steroid use  GERD: continue on PPI   Thrombocytopenia: likely secondary to MDS. Labile   Obesity: BMI 34.3. Complicates overall  care & prognosis   DVT prophylaxis: lovenox  Code Status: full  Family Communication: Family at bedside Disposition Plan: d/c to SNF but has to complete a 10 day quarantine for COVID19 prior   Level of care: Med-Surg  Status is: Inpatient Remains inpatient appropriate because: has to complete a 10 day quarantine for [place] prior to d/c    Consultants:    Procedures:   Antimicrobials:    Subjective: Patient has no complaints.  Denies chest pain,  shortness of breath or any other symptoms  Objective: Vitals:   04/15/22 2025 04/16/22 1627 04/16/22 2050 04/17/22 0600  BP: (!) 157/79 139/78 (!) 152/85 (!) 148/78  Pulse: 96 91 81 76  Resp:  16 17 18   Temp:  98.1 F (36.7 C) 98.3 F (36.8 C) 98.2 F (36.8 C)  TempSrc:  Oral  Oral  SpO2:   97% 98%  Weight:      Height:        Intake/Output Summary (Last 24 hours) at 04/17/2022 0816 Last data filed at 04/17/2022 0512 Gross per 24 hour  Intake --  Output 1300 ml  Net -1300 ml    Filed Weights   04/10/22 1421  Weight: 93.5 kg    Examination: Calm, NAD Cta no w/r Reg s1/s2 no gallop Soft benign +bs No edema Awake and alert Mood and affect appropriate in current setting     Data Reviewed: I have personally reviewed following labs and imaging studies  CBC: Recent Labs  Lab 04/11/22 0550 04/12/22 0502 04/13/22 0540 04/14/22 0710 04/15/22 0448 04/16/22 0543 04/17/22 0528  WBC 4.0 6.7 7.9 7.5 7.2 5.7 5.8  NEUTROABS 3.1 5.4 6.6 6.2 5.5  --   --   HGB 10.6* 10.6* 10.2* 10.3* 10.3* 10.6* 10.4*  HCT 32.4* 31.7* 30.4* 30.9* 31.6* 32.4* 31.1*  MCV 102.9* 101.6* 99.3 101.0* 100.3* 99.7 99.4  PLT 96* 109* 102* 102* 110* 109* 505*   Basic Metabolic Panel: Recent Labs  Lab 04/13/22 0540 04/14/22 0710 04/15/22 0448 04/16/22 0543 04/17/22 0528  NA 137 138 137 140 138  K 4.3 4.2 4.2 3.9 3.9  CL 112* 113* 109 114* 109  CO2 20* 20* 22 23 26   GLUCOSE 243* 204* 195* 117* 182*  BUN 60* 67* 57* 49* 38*  CREATININE 1.63* 1.73* 1.44* 1.55* 1.41*  CALCIUM 8.8* 8.5* 8.3* 8.4* 8.2*   GFR: Estimated Creatinine Clearance: 40.3 mL/min (A) (by C-G formula based on SCr of 1.41 mg/dL (H)). Liver Function Tests: Recent Labs  Lab 04/11/22 0550 04/12/22 0502 04/13/22 0540 04/14/22 0710 04/15/22 0448  AST 27 23 42* 37 26  ALT 14 14 23 31  33  ALKPHOS 68 63 57 54 54  BILITOT 0.9 0.6 0.7 0.6 1.0  PROT 6.9 6.5 6.4* 6.3* 6.5  ALBUMIN 3.4* 3.1* 3.1* 3.0* 3.1*   No results  for input(s): "LIPASE", "AMYLASE" in the last 168 hours. No results for input(s): "AMMONIA" in the last 168 hours. Coagulation Profile: No results for input(s): "INR", "PROTIME" in the last 168 hours.  Cardiac Enzymes: No results for input(s): "CKTOTAL", "CKMB", "CKMBINDEX", "TROPONINI" in the last 168 hours. BNP (last 3 results) No results for input(s): "PROBNP" in the last 8760 hours. HbA1C: No results for input(s): "HGBA1C" in the last 72 hours. CBG: Recent Labs  Lab 04/16/22 0908 04/16/22 1213 04/16/22 1623 04/16/22 2228 04/17/22 0808  GLUCAP 130* 259* 300* 183* 193*   Lipid Profile: No results for input(s): "CHOL", "HDL", "LDLCALC", "TRIG", "CHOLHDL", "LDLDIRECT" in the last  72 hours. Thyroid Function Tests: No results for input(s): "TSH", "T4TOTAL", "FREET4", "T3FREE", "THYROIDAB" in the last 72 hours. Anemia Panel: No results for input(s): "VITAMINB12", "FOLATE", "FERRITIN", "TIBC", "IRON", "RETICCTPCT" in the last 72 hours.  Sepsis Labs: Recent Labs  Lab 04/10/22 1945 04/10/22 2129 04/11/22 0043  PROCALCITON  --   --  0.27  LATICACIDVEN 1.4 1.1  --     Recent Results (from the past 240 hour(s))  Resp Panel by RT-PCR (Flu A&B, Covid) Anterior Nasal Swab     Status: Abnormal   Collection Time: 04/09/22  9:50 PM   Specimen: Anterior Nasal Swab  Result Value Ref Range Status   SARS Coronavirus 2 by RT PCR POSITIVE (A) NEGATIVE Final    Comment: (NOTE) SARS-CoV-2 target nucleic acids are DETECTED.  The SARS-CoV-2 RNA is generally detectable in upper respiratory specimens during the acute phase of infection. Positive results are indicative of the presence of the identified virus, but do not rule out bacterial infection or co-infection with other pathogens not detected by the test. Clinical correlation with patient history and other diagnostic information is necessary to determine patient infection status. The expected result is Negative.  Fact Sheet for  Patients: EntrepreneurPulse.com.au  Fact Sheet for Healthcare Providers: IncredibleEmployment.be  This test is not yet approved or cleared by the [place] FDA and  has been authorized for detection and/or diagnosis of SARS-CoV-2 by FDA under an Emergency Use Authorization (EUA).  This EUA will remain in effect (meaning this test can be used) for the duration of  the COVID-19 declaration under Section 564(b)(1) of the A ct, 21 U.S.C. section 360bbb-3(b)(1), unless the authorization is terminated or revoked sooner.     Influenza A by PCR NEGATIVE NEGATIVE Final   Influenza B by PCR NEGATIVE NEGATIVE Final    Comment: (NOTE) The Xpert Xpress SARS-CoV-2/FLU/RSV plus assay is intended as an aid in the diagnosis of influenza from Nasopharyngeal swab specimens and should not be used as a sole basis for treatment. Nasal washings and aspirates are unacceptable for Xpert Xpress SARS-CoV-2/FLU/RSV testing.  Fact Sheet for Patients: EntrepreneurPulse.com.au  Fact Sheet for Healthcare Providers: IncredibleEmployment.be  This test is not yet approved or cleared by the [place] FDA and has been authorized for detection and/or diagnosis of SARS-CoV-2 by FDA under an Emergency Use Authorization (EUA). This EUA will remain in effect (meaning this test can be used) for the duration of the COVID-19 declaration under Section 564(b)(1) of the Act, 21 U.S.C. section 360bbb-3(b)(1), unless the authorization is terminated or revoked.  Performed at [facility], [address] [place], [place] [address]   Blood Culture (routine x 2)     Status: None   Collection Time: 04/09/22  9:50 PM   Specimen: BLOOD  Result Value Ref Range Status   Specimen Description BLOOD LEFT ASSIST CONTROL  Final   Special Requests   Final    BOTTLES DRAWN AEROBIC AND ANAEROBIC Blood Culture results may not be optimal due to an  excessive volume of blood received in culture bottles   Culture   Final    NO GROWTH 5 DAYS Performed at [facility], [address], [place], [place] [address]    Report Status 04/14/2022 FINAL  Final  Blood Culture (routine x 2)     Status: None   Collection Time: 04/09/22  9:50 PM   Specimen: BLOOD  Result Value Ref Range Status   Specimen Description BLOOD RIGHT FOREARM  Final   Special  Requests   Final    BOTTLES DRAWN AEROBIC AND ANAEROBIC Blood Culture results may not be optimal due to an excessive volume of blood received in culture bottles   Culture   Final    NO GROWTH 5 DAYS Performed at [facility], [address], [place], [place] [address]    Report Status 04/14/2022 FINAL  Final  Urine Culture     Status: None   Collection Time: 04/09/22  9:50 PM   Specimen: Urine, Random  Result Value Ref Range Status   Specimen Description   Final    URINE, RANDOM Performed at [facility], [address], [place], [place] [address]    Special Requests   Final    NONE Performed at [facility], [address] [place], [place] [address]    Culture   Final    NO GROWTH Performed at [facility], [place] [address], [place], [place] [address]    Report Status 04/11/2022 FINAL  Final         Radiology Studies: US Venous Img Upper Uni Left (DVT)  Result Date: 04/15/2022 CLINICAL DATA:  LEFT upper extremity swelling EXAM: LEFT UPPER EXTREMITY VENOUS DOPPLER ULTRASOUND TECHNIQUE: Gray-scale sonography with graded compression, as well as color Doppler and duplex ultrasound were performed to evaluate the upper extremity deep venous system from the level of the subclavian vein and including the jugular, axillary, basilic, radial, ulnar and upper cephalic vein. Spectral Doppler was utilized to evaluate flow at rest and with distal augmentation maneuvers. COMPARISON:  CTA chest, 04/09/2022. FINDINGS: VENOUS Normal compressibility of the  LEFT internal jugular, subclavian, axillary, cephalic, basilic, brachial, radial and ulnar veins. No filling defects to suggest DVT on grayscale or color Doppler imaging. Doppler waveforms show normal direction of venous flow, normal respiratory plasticity and response to augmentation. Limited views of the contralateral subclavian vein are unremarkable. OTHER No evidence of superficial thrombophlebitis or abnormal fluid collection. Limitations: none IMPRESSION: No evidence of DVT or superficial thrombophlebitis within the LEFT upper extremity. Michaelle Birks, MD [facility] Radiology Electronically Signed   By: Michaelle Birks M.D.   On: 04/15/2022 15:06        Scheduled Meds:  amLODipine  10 mg Oral Daily   calcium carbonate  800 mg of elemental calcium Oral BID   dexamethasone  2 mg Oral Q breakfast   diphenhydrAMINE  25 mg Oral QHS   doxazosin  1 mg Oral Daily   enoxaparin (LOVENOX) injection  0.5 mg/kg Subcutaneous Q24H   insulin aspart  0-20 Units Subcutaneous TID WC   insulin aspart  0-5 Units Subcutaneous QHS   insulin glargine-yfgn  10 Units Subcutaneous QHS   pantoprazole  40 mg Oral BID   Continuous Infusions:     LOS: 7 days    Time spent: 35 mins     Nolberto Hanlon, MD [facility] Pager [phone]  If 7PM-7AM, please contact night-coverage [email or web address] 04/17/2022, 8:16 AM

## 2022-04-17 NOTE — TOC Progression Note (Signed)
Transition of Care [facility]) - Progression Note    Patient Details  Name: Alex Smith MRN: [id number] Date of Birth: December 11, 1934  [facility] CM/SW Contact  Laurena Slimmer, RN Phone Number: 04/17/2022, 4:32 PM  Clinical Narrative:  Bed offer pending.         Expected Discharge Plan and Services                                                 Social Determinants of Health (SDOH) Interventions    Readmission Risk Interventions    04/13/2022    2:31 PM 08/19/2021   10:21 AM 05/11/2021   10:42 AM  Readmission Risk Prevention Plan  Transportation Screening Complete Complete Complete  PCP or Specialist Appt within 3-5 Days Complete    HRI or [place] Complete    Social Work Consult for [place] Planning/Counseling Complete    Palliative Care Screening Complete    Medication Review Press photographer) Complete Complete Complete  PCP or Specialist appointment within 3-5 days of discharge  Complete Complete  HRI or Home Care Consult  Complete Complete  SW Recovery Care/Counseling Consult  Complete Complete  Palliative Care Screening  Not Applicable Complete  Skilled Nursing Facility  Complete Complete

## 2022-04-17 NOTE — Inpatient Diabetes Management (Signed)
Inpatient Diabetes Program Recommendations  AACE/ADA: New Consensus Statement on Inpatient Glycemic Control (2015)  Target Ranges:  Prepandial:   less than 140 mg/dL      Peak postprandial:   less than 180 mg/dL (1-2 hours)      Critically ill patients:  140 - 180 mg/dL   Lab Results  Component Value Date   GLUCAP 172 (H) 04/17/2022   HGBA1C 4.9 06/29/2021    Review of Glycemic Control  Latest Reference Range & Units 04/16/22 09:08 04/16/22 12:13 04/16/22 16:23 04/16/22 22:28 04/17/22 08:08 04/17/22 08:37  Glucose-Capillary 70 - 99 mg/dL 130 (H) 259 (H) 300 (H) 183 (H) 193 (H) 172 (H)  (H): Data is abnormally high  Diabetes history: DM2 Outpatient Diabetes medications: Humalog 0-9 units with meals, Lantus 15 units QHS, Januvia 50 mg daily Current orders for Inpatient glycemic control: Semglee 10 units QHS, Novolog 0-20 units TID with meals, Novolog 0-5 units; Decadron 2 mg QAM  Inpatient Diabetes Program Recommendations:    Novolog 4 units TID with meals if consumes at least 50%.  Will continue to follow while inpatient.  Thank you, Reche Dixon, MSN, [place] [facility] Coordinator Inpatient Diabetes Program [phone] (team pager from 8a-5p)

## 2022-04-18 DIAGNOSIS — U071 COVID-19: Secondary | ICD-10-CM | POA: Diagnosis not present

## 2022-04-18 LAB — GLUCOSE, CAPILLARY
Glucose-Capillary: 258 mg/dL — ABNORMAL HIGH (ref 70–99)
Glucose-Capillary: 264 mg/dL — ABNORMAL HIGH (ref 70–99)
Glucose-Capillary: 280 mg/dL — ABNORMAL HIGH (ref 70–99)
Glucose-Capillary: 458 mg/dL — ABNORMAL HIGH (ref 70–99)
Glucose-Capillary: 249 mg/dL — ABNORMAL HIGH (ref 70–99)
Glucose-Capillary: 255 mg/dL — ABNORMAL HIGH (ref 70–99)

## 2022-04-18 MED ORDER — INSULIN GLARGINE-YFGN 100 UNIT/ML ~~LOC~~ SOLN
10.0000 [IU] | Freq: Every day | SUBCUTANEOUS | Status: DC
  Administered 2022-04-18: 10 [IU] via SUBCUTANEOUS
  Filled 2022-04-18 (×2): qty 0.1

## 2022-04-18 MED ORDER — INSULIN GLARGINE-YFGN 100 UNIT/ML ~~LOC~~ SOLN
12.0000 [IU] | Freq: Every day | SUBCUTANEOUS | Status: DC
  Filled 2022-04-18: qty 0.12

## 2022-04-18 MED ORDER — INSULIN ASPART 100 UNIT/ML IJ SOLN
4.0000 [IU] | Freq: Three times a day (TID) | INTRAMUSCULAR | Status: DC
  Administered 2022-04-18 – 2022-04-20 (×6): 4 [IU] via SUBCUTANEOUS
  Filled 2022-04-18 (×6): qty 1

## 2022-04-18 MED ORDER — DOXAZOSIN MESYLATE 1 MG PO TABS: 1.0000 mg | ORAL_TABLET | Freq: Every day | ORAL | Status: DC

## 2022-04-18 NOTE — Progress Notes (Signed)
Occupational Therapy Treatment Patient Details Name: Alex Smith MRN: [id number] DOB: [birth year] Today's Date: 04/18/2022   History of present illness Pt is an 87 y.o. male presenting to hospital 8/23 with c/o fever and weakness and falls; seen in ED day prior and diagnosed with COVID-19.  Pt admitted with COVID-19 and acute hypoxic respiratory failure.  PMH includes anemia, CKD, htn, DM, multiple myeloma (currently on chemotherapy), hairy cell leukemia in remission, Alzheimer's dementia.   OT comments  Alex Smith was seen for OT treatment on this date. Upon arrival to room pt reclined in bed, agreeable to tx. Pt requires MOD A bed mobility, MOD A face washing seated - assist for heavy posterior lean. MOD A + RW for simulated BSC t/f. MAX A pericare in standing, fair static balance. Pt making good progress toward goals, will continue to follow POC. Discharge recommendation remains appropriate.     Recommendations for follow up therapy are one component of a multi-disciplinary discharge planning process, led by the attending physician.  Recommendations may be updated based on patient status, additional functional criteria and insurance authorization.    Follow Up Recommendations  Skilled nursing-short term rehab (<3 hours/day)    Assistance Recommended at Discharge Frequent or constant Supervision/Assistance  Patient can return home with the following  Two people to help with walking and/or transfers;A lot of help with bathing/dressing/bathroom   Equipment Recommendations  Other (comment) (defer)    Recommendations for Other Services      Precautions / Restrictions Precautions Precautions: Fall Restrictions Weight Bearing Restrictions: No       Mobility Bed Mobility Overal bed mobility: Needs Assistance Bed Mobility: Supine to Sit     Supine to sit: Mod assist     General bed mobility comments: assist for trunk    Transfers Overall transfer level: Needs  assistance Equipment used: Rolling walker (2 wheels) Transfers: Sit to/from Stand Sit to Stand: Mod assist                 Balance Overall balance assessment: Needs assistance Sitting-balance support: Feet supported, Bilateral upper extremity supported Sitting balance-Leahy Scale: Poor     Standing balance support: Bilateral upper extremity supported, Reliant on assistive device for balance Standing balance-Leahy Scale: Fair                             ADL either performed or assessed with clinical judgement   ADL Overall ADL's : Needs assistance/impaired                                       General ADL Comments: MOD A face washing seated - assist for heavy posterior lean. MOD A + RW for simulated BSC t/f. MAX A pericare in standing, fair static balance      Cognition Arousal/Alertness: Awake/alert Behavior During Therapy: WFL for tasks assessed/performed Overall Cognitive Status: History of cognitive impairments - at baseline                                 General Comments: poor safety awareness                   Pertinent Vitals/ Pain       Pain Assessment Pain Assessment: No/denies pain   Frequency  Min 2X/week  Progress Toward Goals  OT Goals(current goals can now be found in the care plan section)  Progress towards OT goals: Progressing toward goals  Acute Rehab OT Goals Patient Stated Goal: to go home OT Goal Formulation: With patient Time For Goal Achievement: 04/26/22 Potential to Achieve Goals: Fair ADL Goals Pt Will Perform Grooming: with set-up;with supervision;sitting Pt Will Perform Upper Body Dressing: sitting Pt Will Transfer to Toilet: with mod assist;stand pivot transfer;bedside commode  Plan Discharge plan remains appropriate;Frequency remains appropriate    Co-evaluation                 AM-PAC OT "6 Clicks" Daily Activity     Outcome Measure   Help from another  person eating meals?: None Help from another person taking care of personal grooming?: A Little Help from another person toileting, which includes using toliet, bedpan, or urinal?: A Lot Help from another person bathing (including washing, rinsing, drying)?: A Lot Help from another person to put on and taking off regular upper body clothing?: A Lot Help from another person to put on and taking off regular lower body clothing?: A Lot 6 Click Score: 15    End of Session Equipment Utilized During Treatment: Rolling walker (2 wheels)  OT Visit Diagnosis: Unsteadiness on feet (R26.81);Muscle weakness (generalized) (M62.81);Repeated falls (R29.6)   Activity Tolerance Patient tolerated treatment well   Patient Left in chair;with call bell/phone within reach;with chair alarm set   Nurse Communication Mobility status        Time: 1505-6979 OT Time Calculation (min): 11 min  Charges: OT General Charges $OT Visit: 1 Visit OT Treatments $Self Care/Home Management : 8-22 mins  Alex Smith, M.S. OTR/L  04/18/22, 2:03 PM  ascom [phone]

## 2022-04-18 NOTE — Progress Notes (Signed)
PROGRESS NOTE   HPI was taken from Dr. Marcello Moores: Alex Smith is a 87 y.o. 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 He was discharged home with supportive care, no antivirals were prescribed at that time.  Patient now returns to ed 12 hours later from ALF via EMS with increase weakness and falls for further evaluation.  Please note history mostly given by son. ON further ros patient noted no n/v/d/dysuria/ increase sob but notes cough, persistent fever/fatigue generalized malaise and confusion.  ED Course:  Vitals: temp 103.1 bp 182/79, hr 107, rr 16  Sat 88% Wbc 3.8, hgb 10.5,plt 92 Lactic 1.4 Na 138, K 4.0,glu 167, cr 1.3 base 1.1 Ce 10 EKG: snr pvc Procal < .10 Tx LR 1L, zofran  As per Dr. Jimmye Norman 8/24-8/29/23: Pt presented to the ER w/ fever and fatigue and was found to have COVID19 infection. No pneumonia was seen on CXR or CTA chest. CTA chest was neg for PE. Pt has completed remdesivir course and is on a steroid taper. PT/OT evaluated pt and recs SNF. Pt has to complete a 10 day quarantine prior to d/c to SNF. Pt was positive for COVID19 on 04/09/22.   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 CTA ches neg for PE. Completed remdesivir.  8/31 continue steroids, bronchodilators and I-S     Acute hypoxic respiratory failure: secondary to above.  8/31 on room air.  Weaned off of oxygen.  Continue to monitor     Generalized weakness: OT/PT recs SNF.  8/31 needs quarantine for 10 days prior to DC to SNF.  SNF pending      Likely AKI on CKD: unknown baseline Cr/GFR, currently stage IIIb.  8/31 creatinine improving slowly, close to baseline  Continue to hold ACE inhibitors      Alzheimer's dementia: continue on aricept  Hairy cell leukemia: last received chemo in July 2016. In remission. 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 Likely etiology of anemia and thrombocytopenia   Multiple myeloma: last received treatment in 2017  HTN: continue on amlodipine (increased dose on 8/26), cardura. Holding ACE-I secondary to AKI on CKD   DM2: likely poorly controlled.  BG elevated  Increase Lantus to 12 units daily  Add NovoLog 4 units 3 times daily with meals  RISS  GERD: continue on PPI   Thrombocytopenia: likely secondary to MDS. Labile   Obesity: BMI 34.3. Complicates overall care & prognosis   DVT prophylaxis: lovenox  Code Status: full  Family Communication: SNF  Disposition Plan: d/c to SNF but has to complete a 10 day quarantine for COVID19 prior   Level of care: Med-Surg  Status is: Inpatient Remains inpatient appropriate because: has to complete a 10 day quarantine for COVID19 prior to d/c    Consultants:    Procedures:   Antimicrobials:    Subjective: No  complaints this am  Objective: Vitals:   04/17/22 2023 04/17/22 2200 04/18/22 0500 04/18/22 0630  BP: (!) 170/79 (!) 152/80 (!) 178/84 (!) 168/84  Pulse: 78 80 84 79  Resp: 18  20   Temp: 97.7 F (36.5 C)  97.9 F (36.6 C)   TempSrc: Oral  Oral   SpO2: 97%  96%   Weight:      Height:        Intake/Output Summary (Last 24 hours) at 04/18/2022 0835 Last data filed at 04/18/2022 0500 Gross per 24 hour  Intake --  Output 500 ml  Net -500 ml    Filed Weights   04/10/22 1421  Weight: 93.5 kg    Examination: Calm, NAD Cta no w/r Reg s1/s2 no gallop Soft benign +bs No edema Awake and alert Mood and affect appropriate in current setting     Data Reviewed: I have personally reviewed following labs and imaging studies  CBC: Recent Labs  Lab 04/12/22 0502 04/13/22 0540 04/14/22 0710 04/15/22 0448 04/16/22 0543 04/17/22 0528  WBC 6.7 7.9 7.5 7.2 5.7 5.8  NEUTROABS 5.4 6.6 6.2 5.5  --   --   HGB 10.6* 10.2* 10.3* 10.3* 10.6* 10.4*  HCT 31.7* 30.4* 30.9* 31.6* 32.4* 31.1*  MCV 101.6* 99.3 101.0* 100.3* 99.7 99.4  PLT 109* 102* 102* 110* 109* 944*   Basic Metabolic Panel: Recent Labs  Lab 04/13/22 0540 04/14/22 0710 04/15/22 0448 04/16/22 0543 04/17/22 0528  NA 137 138 137 140 138  K 4.3 4.2 4.2 3.9 3.9  CL 112* 113* 109 114* 109  CO2 20* 20* _0 GLUCOSE 243* 204* 195* 117* 182*  BUN 60* 67* 57* 49* 38*  CREATININE 1.63* 1.73* 1.44* 1.55* 1.41*  CALCIUM 8.8* 8.5* 8.3* 8.4* 8.2*   GFR: Estimated Creatinine Clearance: 40.3 mL/min (A) (by C-G formula based on SCr of 1.41 mg/dL (H)). Liver Function Tests: Recent Labs  Lab 04/12/22 0502 04/13/22 0540 04/14/22 0710 04/15/22 0448  AST 23 42* 37 26  ALT _1 33  ALKPHOS 63 57 54 54  BILITOT 0.6 0.7 0.6 1.0  PROT 6.5 6.4* 6.3* 6.5  ALBUMIN 3.1* 3.1* 3.0* 3.1*   No results for input(s): "LIPASE", "AMYLASE" in the last 168 hours. No results for input(s): "AMMONIA" in the last 168  hours. Coagulation Profile: No results for input(s): "INR", "PROTIME" in the last 168 hours.  Cardiac Enzymes: No results for input(s): "CKTOTAL", "CKMB", "CKMBINDEX", "TROPONINI" in the last 168 hours. BNP (last 3 results) No results for input(s): "PROBNP" in the last 8760 hours. HbA1C: No results for input(s): "HGBA1C" in the last 72 hours. CBG: Recent Labs  Lab 04/17/22 0808 04/17/22 0837 04/17/22 1245 04/17/22 1654 04/17/22 2005  GLUCAP 193* 172* 387* 272* 197*   Lipid Profile: No results for input(s): "CHOL", "HDL", "LDLCALC", "TRIG", "CHOLHDL", "LDLDIRECT" in the last 72 hours. Thyroid Function Tests: No results for input(s): "TSH", "T4TOTAL", "  FREET4", "T3FREE", "THYROIDAB" in the last 72 hours. Anemia Panel: No results for input(s): "VITAMINB12", "FOLATE", "FERRITIN", "TIBC", "IRON", "RETICCTPCT" in the last 72 hours.  Sepsis Labs: No results for input(s): "PROCALCITON", "LATICACIDVEN" in the last 168 hours.   Recent Results (from the past 240 hour(s))  Resp Panel by RT-PCR (Flu A&B, Covid) Anterior Nasal Swab     Status: Abnormal   Collection Time: 04/09/22  9:50 PM   Specimen: Anterior Nasal Swab  Result Value Ref Range Status   SARS Coronavirus 2 by RT PCR POSITIVE (A) NEGATIVE Final    Comment: (NOTE) SARS-CoV-2 target nucleic acids are DETECTED.  The SARS-CoV-2 RNA is generally detectable in upper respiratory specimens during the acute phase of infection. Positive results are indicative of the presence of the identified virus, but do not rule out bacterial infection or co-infection with other pathogens not detected by the test. Clinical correlation with patient history and other diagnostic information is necessary to determine patient infection status. The expected result is Negative.  Fact Sheet for Patients: EntrepreneurPulse.com.au  Fact Sheet for Healthcare Providers: IncredibleEmployment.be  This test is not  yet approved or cleared by the [place] FDA and  has been authorized for detection and/or diagnosis of SARS-CoV-2 by FDA under an Emergency Use Authorization (EUA).  This EUA will remain in effect (meaning this test can be used) for the duration of  the COVID-19 declaration under Section 564(b)(1) of the A ct, 21 U.S.C. section 360bbb-3(b)(1), unless the authorization is terminated or revoked sooner.     Influenza A by PCR NEGATIVE NEGATIVE Final   Influenza B by PCR NEGATIVE NEGATIVE Final    Comment: (NOTE) The Xpert Xpress SARS-CoV-2/FLU/RSV plus assay is intended as an aid in the diagnosis of influenza from Nasopharyngeal swab specimens and should not be used as a sole basis for treatment. Nasal washings and aspirates are unacceptable for Xpert Xpress SARS-CoV-2/FLU/RSV testing.  Fact Sheet for Patients: EntrepreneurPulse.com.au  Fact Sheet for Healthcare Providers: IncredibleEmployment.be  This test is not yet approved or cleared by the [place] FDA and has been authorized for detection and/or diagnosis of SARS-CoV-2 by FDA under an Emergency Use Authorization (EUA). This EUA will remain in effect (meaning this test can be used) for the duration of the COVID-19 declaration under Section 564(b)(1) of the Act, 21 U.S.C. section 360bbb-3(b)(1), unless the authorization is terminated or revoked.  Performed at [facility], [address] [place], [place] [address]   Blood Culture (routine x 2)     Status: None   Collection Time: 04/09/22  9:50 PM   Specimen: BLOOD  Result Value Ref Range Status   Specimen Description BLOOD LEFT ASSIST CONTROL  Final   Special Requests   Final    BOTTLES DRAWN AEROBIC AND ANAEROBIC Blood Culture results may not be optimal due to an excessive volume of blood received in culture bottles   Culture   Final    NO GROWTH 5 DAYS Performed at [facility], [address],  [place],  [address]    Report Status 04/14/2022 FINAL  Final  Blood Culture (routine x 2)     Status: None   Collection Time: 04/09/22  9:50 PM   Specimen: BLOOD  Result Value Ref Range Status   Specimen Description BLOOD RIGHT FOREARM  Final   Special Requests   Final    BOTTLES DRAWN AEROBIC AND ANAEROBIC Blood Culture results may not be optimal due to an excessive volume of blood received  in culture bottles   Culture   Final    NO GROWTH 5 DAYS Performed at [facility], [address] [place], [place] [address]    Report Status 04/14/2022 FINAL  Final  Urine Culture     Status: None   Collection Time: 04/09/22  9:50 PM   Specimen: Urine, Random  Result Value Ref Range Status   Specimen Description   Final    URINE, RANDOM Performed at [facility], [address] [place], [place] [address]    Special Requests   Final    NONE Performed at [facility], [address], [place], [place] [address]    Culture   Final    NO GROWTH Performed at [facility], [place] [address], [place], [place] [address]    Report Status 04/11/2022 FINAL  Final         Radiology Studies: No results found.      Scheduled Meds:  amLODipine  10 mg Oral Daily   calcium carbonate  800 mg of elemental calcium Oral BID   dexamethasone  2 mg Oral Q breakfast   diphenhydrAMINE  25 mg Oral QHS   doxazosin  1 mg Oral Daily   enoxaparin (LOVENOX) injection  0.5 mg/kg Subcutaneous Q24H   insulin aspart  0-20 Units Subcutaneous TID WC   insulin aspart  0-5 Units Subcutaneous QHS   insulin glargine-yfgn  10 Units Subcutaneous QHS   pantoprazole  40 mg Oral BID   Continuous Infusions:     LOS: 8 days    Time spent: 35 mins     Nolberto Hanlon, MD [facility] Pager [phone]  If 7PM-7AM, please contact night-coverage [email or web address] 04/18/2022, 8:35 AM

## 2022-04-18 NOTE — TOC Progression Note (Signed)
Transition of Care [facility]) - Progression Note    Patient Details  Name: Alex Smith MRN: [id number] Date of Birth: [birth year]  Transition of Care [facility]) CM/SW Contact  Laurena Slimmer, RN Phone Number: 04/18/2022, 4:11 PM  Clinical Narrative:    Spoke with patient's son regarding bed offer. Advised no offers had been given. Advised CM would call and extend bed search to [facility].   Call placed to [facility] at [facility] still being reviewed        Expected Discharge Plan and Services                                                 Social Determinants of Health (SDOH) Interventions    Readmission Risk Interventions    04/13/2022    2:31 PM 08/19/2021   10:21 AM 05/11/2021   10:42 AM  Readmission Risk Prevention Plan  Transportation Screening Complete Complete Complete  PCP or Specialist Appt within 3-5 Days Complete    HRI or [place] Complete    Social Work Consult for [place] Planning/Counseling Complete    Palliative Care Screening Complete    Medication Review Press photographer) Complete Complete Complete  PCP or Specialist appointment within 3-5 days of discharge  Complete Complete  HRI or Home Care Consult  Complete Complete  SW Recovery Care/Counseling Consult  Complete Complete  Palliative Care Screening  Not Applicable Complete  Skilled Nursing Facility  Complete Complete

## 2022-04-18 NOTE — Inpatient Diabetes Management (Signed)
Inpatient Diabetes Program Recommendations  AACE/ADA: New Consensus Statement on Inpatient Glycemic Control (2015)  Target Ranges:  Prepandial:   less than 140 mg/dL      [facility] postprandial:   less than 180 mg/dL (1-2 hours)      Critically ill patients:  140 - 180 mg/dL   Lab Results  Component Value Date   GLUCAP 264 (H) 04/18/2022   HGBA1C 4.9 06/29/2021    Review of Glycemic Control  Latest Reference Range & Units 04/18/22 09:49 04/18/22 11:44  Glucose-Capillary 70 - 99 mg/dL 258 (H) 264 (H)  (H): Data is abnormally high Diabetes history: DM2 Outpatient Diabetes medications: Humalog 0-9 units with meals, Lantus 15 units QHS, Januvia 50 mg daily Current orders for Inpatient glycemic control: Semglee 10 units QHS, Novolog 0-20 units TID with meals, Novolog 0-5 units   Inpatient Diabetes Program Recommendations:     Novolog 4 units TID with meals if consumes at least 50%.  Will continue to follow while inpatient.  Thank you, Reche Dixon, MSN, [place] Diabetes Coordinator Inpatient Diabetes Program [phone] (team pager from 8a-5p)

## 2022-04-18 NOTE — Progress Notes (Signed)
IV team consult for PIV. There are no IV medications ordered at this time. Pt. Is awaiting SNF placement after quarantine. IV team does not place PIVs just in case. If there is a change in condition of this patient or IV medications become necessary, please put in an additional consult.  RN notified.

## 2022-04-19 DIAGNOSIS — U071 COVID-19: Secondary | ICD-10-CM | POA: Diagnosis not present

## 2022-04-19 LAB — GLUCOSE, CAPILLARY
Glucose-Capillary: 190 mg/dL — ABNORMAL HIGH (ref 70–99)
Glucose-Capillary: 236 mg/dL — ABNORMAL HIGH (ref 70–99)
Glucose-Capillary: 177 mg/dL — ABNORMAL HIGH (ref 70–99)
Glucose-Capillary: 71 mg/dL (ref 70–99)

## 2022-04-19 MED ORDER — INSULIN GLARGINE-YFGN 100 UNIT/ML ~~LOC~~ SOLN
12.0000 [IU] | Freq: Every day | SUBCUTANEOUS | Status: DC
Start: 2022-04-19 — End: 2022-04-20
  Administered 2022-04-19: 12 [IU] via SUBCUTANEOUS
  Filled 2022-04-19 (×2): qty 0.12

## 2022-04-19 NOTE — Progress Notes (Signed)
Physical Therapy Treatment Patient Details Name: Alex Smith MRN: [id number] DOB: 08/30/1934 Today's Date: 04/19/2022   History of Present Illness Pt is an 87 y.o. male presenting to hospital 8/23 with c/o fever and weakness and falls; seen in ED day prior and diagnosed with COVID-19.  Pt admitted with COVID-19 and acute hypoxic respiratory failure.  PMH includes anemia, CKD, htn, DM, multiple myeloma (currently on chemotherapy), hairy cell leukemia in remission, Alzheimer's dementia.    PT Comments    Pt resting in bed upon PT arrival; agreeable to PT session with encouragement (pt reporting feeling tired).  During session pt mod assist semi-supine to sitting edge of bed; min assist with transfers using RW; and and CGA to min assist to ambulate 20 feet with RW use (limited distance d/t pt fatigue).  Will continue to focus on strengthening, endurance, and progressive functional mobility during hospitalization.    Recommendations for follow up therapy are one component of a multi-disciplinary discharge planning process, led by the attending physician.  Recommendations may be updated based on patient status, additional functional criteria and insurance authorization.  Follow Up Recommendations  Skilled nursing-short term rehab (<3 hours/day) Can patient physically be transported by private vehicle: Yes   Assistance Recommended at Discharge Frequent or constant Supervision/Assistance  Patient can return home with the following Assistance with cooking/housework;Assist for transportation;Help with stairs or ramp for entrance;A lot of help with bathing/dressing/bathroom;A little help with walking and/or transfers   Equipment Recommendations  Rolling walker (2 wheels);BSC/3in1;Wheelchair (measurements PT);Wheelchair cushion (measurements PT)    Recommendations for Other Services OT consult     Precautions / Restrictions Precautions Precautions: Fall Restrictions Weight Bearing Restrictions:  No     Mobility  Bed Mobility Overal bed mobility: Needs Assistance Bed Mobility: Supine to Sit     Supine to sit: Mod assist     General bed mobility comments: assist for trunk; vc's for technique and use of bed rail    Transfers Overall transfer level: Needs assistance Equipment used: Rolling walker (2 wheels) Transfers: Sit to/from Stand Sit to Stand: Min assist           General transfer comment: assist to initiate stand and control descent sitting; vc's for UE placement    Ambulation/Gait Ambulation/Gait assistance: Min guard, Min assist Gait Distance (Feet): 20 Feet Assistive device: Rolling walker (2 wheels)   Gait velocity: decreased     General Gait Details: partial step through gait pattern   Stairs             Wheelchair Mobility    Modified Rankin (Stroke Patients Only)       Balance Overall balance assessment: Needs assistance Sitting-balance support: Feet supported, Bilateral upper extremity supported Sitting balance-Leahy Scale: Fair Sitting balance - Comments: steady static sitting   Standing balance support: Bilateral upper extremity supported, Reliant on assistive device for balance, During functional activity Standing balance-Leahy Scale: Fair Standing balance comment: requiring UE support on RW for balance                            Cognition Arousal/Alertness: Awake/alert Behavior During Therapy: WFL for tasks assessed/performed Overall Cognitive Status: History of cognitive impairments - at baseline                                 General Comments: decreased safety awareness          Exercises      General Comments  Nursing cleared pt for participation in physical therapy.      Pertinent Vitals/Pain Pain Assessment Pain Assessment: No/denies pain Vitals (HR and O2 on room air) stable and WFL end of treatment session.    Home Living                          Prior Function             PT Goals (current goals can now be found in the care plan section) Acute Rehab PT Goals Patient Stated Goal: to go home PT Goal Formulation: With patient Time For Goal Achievement: 04/26/22 Potential to Achieve Goals: Good Progress towards PT goals: Progressing toward goals    Frequency    Min 2X/week      PT Plan Current plan remains appropriate    Co-evaluation              AM-PAC PT "6 Clicks" Mobility   Outcome Measure  Help needed turning from your back to your side while in a flat bed without using bedrails?: A Little Help needed moving from lying on your back to sitting on the side of a flat bed without using bedrails?: A Lot Help needed moving to and from a bed to a chair (including a wheelchair)?: A Little Help needed standing up from a chair using your arms (e.g., wheelchair or bedside chair)?: A Lot Help needed to walk in hospital room?: A Little Help needed climbing 3-5 steps with a railing? : A Lot 6 Click Score: 15    End of Session Equipment Utilized During Treatment: Gait belt Activity Tolerance: Patient tolerated treatment well Patient left: in chair;with call bell/phone within reach;with chair alarm set Nurse Communication: Mobility status;Precautions PT Visit Diagnosis: Unsteadiness on feet (R26.81);Other abnormalities of gait and mobility (R26.89);Muscle weakness (generalized) (M62.81);History of falling (Z91.81)     Time: 3790-2409 PT Time Calculation (min) (ACUTE ONLY): 18 min  Charges:  $Therapeutic Activity: 8-22 mins                     Leitha Bleak, PT 04/19/22, 1:07 PM

## 2022-04-19 NOTE — TOC Progression Note (Addendum)
Transition of Care [facility]) - Progression Note    Patient Details  Name: RAWLEY HARJU MRN: [id number] Date of Birth: 1934/10/09  Transition of Care [facility]) CM/SW Contact  Laurena Slimmer, RN Phone Number: 04/19/2022, 11:32 AM  Clinical Narrative:    Spoke with Gena in admissions at  [facility]. Patient offered a bed. Admission would be accepted 9/2 as this would be the 11th day after patient's + COVID test.   Candace Cruise approved. #[id number]. Approved 9/2-9/6. Next review date 9/6  Patient's son contacted to advised of [facility] bed offer and insurance approval. Patient will need EMS transport.         Expected Discharge Plan and Services                                                 Social Determinants of Health (SDOH) Interventions    Readmission Risk Interventions    04/13/2022    2:31 PM 08/19/2021   10:21 AM 05/11/2021   10:42 AM  Readmission Risk Prevention Plan  Transportation Screening Complete Complete Complete  PCP or Specialist Appt within 3-5 Days Complete    HRI or [place] Complete    Social Work Consult for [place] Planning/Counseling Complete    Palliative Care Screening Complete    Medication Review Press photographer) Complete Complete Complete  PCP or Specialist appointment within 3-5 days of discharge  Complete Complete  HRI or Home Care Consult  Complete Complete  SW Recovery Care/Counseling Consult  Complete Complete  Palliative Care Screening  Not Applicable Complete  Skilled Nursing Facility  Complete Complete

## 2022-04-19 NOTE — Progress Notes (Signed)
PROGRESS NOTE   HPI was taken from Dr. Marcello Moores: Alex Smith is a 87 y.o. 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 He was discharged home with supportive care, no antivirals were prescribed at that time.  Patient now returns to ed 12 hours later from ALF via EMS with increase weakness and falls for further evaluation.  Please note history mostly given by son. ON further ros patient noted no n/v/d/dysuria/ increase sob but notes cough, persistent fever/fatigue generalized malaise and confusion.  ED Course:  Vitals: temp 103.1 bp 182/79, hr 107, rr 16  Sat 88% Wbc 3.8, hgb 10.5,plt 92 Lactic 1.4 Na 138, K 4.0,glu 167, cr 1.3 base 1.1 Ce 10 EKG: snr pvc Procal < .10 Tx LR 1L, zofran  As per Dr. Jimmye Norman 8/24-8/29/23: Pt presented to the ER w/ fever and fatigue and was found to have COVID19 infection. No pneumonia was seen on CXR or CTA chest. CTA chest was neg for PE. Pt has completed remdesivir course and is on a steroid taper. PT/OT evaluated pt and recs SNF. Pt has to complete a 10 day quarantine prior to d/c to SNF. Pt was positive for COVID19 on 04/09/22.   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 CTA ches neg for PE. Completed remdesivir.  9/1 continue steroids, bronchodilators and I-S       Acute hypoxic respiratory failure: secondary to above.  9/1 on room air      Generalized weakness: OT/PT recs SNF.  9/1 needed and total of 10 days to DC to SNF.  Today the last day SNF pending       Likely AKI on CKD: unknown baseline Cr/GFR, currently stage IIIb.  8/31 creatinine improving slowly, close to baseline  9/1 continue to hold ACE inhibitors         Alzheimer's dementia: continue on aricept  Hairy cell leukemia: last received chemo in July 2016. In remission. 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 Likely etiology of anemia and thrombocytopenia   Multiple myeloma: last received treatment in 2017  HTN: continue on amlodipine (increased dose on 8/26), cardura. Holding ACE-I secondary to AKI on CKD   DM2: likely poorly controlled.  BG elevated  Increase Lantus to 12 units daily  Add NovoLog 4 units 3 times daily with meals  9/1 increase Lantus to 12 units daily Continue R-ISS  GERD: continue on PPI   Thrombocytopenia: likely secondary to MDS. Labile   Obesity: BMI 34.3. Complicates overall care & prognosis   DVT prophylaxis: lovenox  Code Status: full  Family Communication: SNF  Disposition Plan: d/c to SNF but has to complete a 10 day quarantine for COVID19 prior   Level of care: Med-Surg  Status is: Inpatient Remains inpatient appropriate because: has to complete a 10 day quarantine for COVID19 prior to d/c    Consultants:    Procedures:   Antimicrobials:     Subjective: No shortness of breath or chest pain.  No complaints  Objective: Vitals:   04/18/22 1709 04/18/22 2036 04/19/22 0431 04/19/22 0749  BP: (!) 157/74 (!) 149/74 (!) 158/71 (!) 173/75  Pulse: 87 85 83 78  Resp: 16 19 19 20   Temp: 97.7 F (36.5 C) 98.8 F (37.1 C) 98.6 F (37 C) 98.5 F (36.9 C)  TempSrc:      SpO2: 97% 96% 96% 99%  Weight:      Height:        Intake/Output Summary (Last 24 hours) at 04/19/2022 0837 Last data filed at 04/19/2022 0415 Gross per 24 hour  Intake --  Output 900 ml  Net -900 ml    Filed Weights   04/10/22 1421  Weight: 93.5 kg    Examination: Calm, NAD Cta no w/r Reg s1/s2 no gallop Soft benign +bs No edema Grossly intact  Mood and affect appropriate in current setting     Data Reviewed: I have personally reviewed following labs and imaging studies  CBC: Recent Labs  Lab 04/13/22 0540 04/14/22 0710 04/15/22 0448 04/16/22 0543 04/17/22 0528  WBC 7.9 7.5 7.2 5.7 5.8  NEUTROABS 6.6 6.2 5.5  --   --   HGB 10.2* 10.3* 10.3* 10.6* 10.4*  HCT 30.4* 30.9* 31.6* 32.4* 31.1*  MCV 99.3 101.0* 100.3* 99.7 99.4  PLT 102* 102* 110* 109* 017*   Basic Metabolic Panel: Recent Labs  Lab 04/13/22 0540 04/14/22 0710 04/15/22 0448 04/16/22 0543 04/17/22 0528  NA 137 138 137 140 138  K 4.3 4.2 4.2 3.9 3.9  CL 112* 113* 109 114* 109  CO2 20* 20* 22 23 26   GLUCOSE 243* 204* 195* 117* 182*  BUN 60* 67* 57* 49* 38*  CREATININE 1.63* 1.73* 1.44* 1.55* 1.41*  CALCIUM 8.8* 8.5* 8.3* 8.4* 8.2*   GFR: Estimated Creatinine Clearance: 40.3 mL/min (A) (by C-G formula based on SCr of 1.41 mg/dL (H)). Liver Function Tests: Recent Labs  Lab 04/13/22 0540 04/14/22 0710 04/15/22 0448  AST 42* 37 26  ALT 23 31 33  ALKPHOS 57 54 54  BILITOT 0.7 0.6 1.0  PROT 6.4* 6.3* 6.5  ALBUMIN 3.1* 3.0* 3.1*   No results for input(s): "LIPASE", "AMYLASE" in the last 168 hours. No results for input(s): "AMMONIA" in the last 168  hours. Coagulation Profile: No results for input(s): "INR", "PROTIME" in the last 168 hours.  Cardiac Enzymes: No results for input(s): "CKTOTAL", "CKMB", "CKMBINDEX", "TROPONINI" in the last 168 hours. BNP (last 3 results) No results for input(s): "PROBNP" in the last 8760 hours. HbA1C: No results for input(s): "HGBA1C" in the last 72 hours. CBG: Recent Labs  Lab 04/18/22 1141 04/18/22 1144 04/18/22 1710 04/18/22 2033 04/19/22 0750  GLUCAP 280* 264* 249* 255* 190*   Lipid Profile: No results for input(s): "CHOL", "HDL", "LDLCALC", "TRIG", "CHOLHDL", "LDLDIRECT" in the last 72 hours. Thyroid Function Tests:  No results for input(s): "TSH", "T4TOTAL", "FREET4", "T3FREE", "THYROIDAB" in the last 72 hours. Anemia Panel: No results for input(s): "VITAMINB12", "FOLATE", "FERRITIN", "TIBC", "IRON", "RETICCTPCT" in the last 72 hours.  Sepsis Labs: No results for input(s): "PROCALCITON", "LATICACIDVEN" in the last 168 hours.   Recent Results (from the past 240 hour(s))  Resp Panel by RT-PCR (Flu A&B, Covid) Anterior Nasal Swab     Status: Abnormal   Collection Time: 04/09/22  9:50 PM   Specimen: Anterior Nasal Swab  Result Value Ref Range Status   SARS Coronavirus 2 by RT PCR POSITIVE (A) NEGATIVE Final    Comment: (NOTE) SARS-CoV-2 target nucleic acids are DETECTED.  The SARS-CoV-2 RNA is generally detectable in upper respiratory specimens during the acute phase of infection. Positive results are indicative of the presence of the identified virus, but do not rule out bacterial infection or co-infection with other pathogens not detected by the test. Clinical correlation with patient history and other diagnostic information is necessary to determine patient infection status. The expected result is Negative.  Fact Sheet for Patients: EntrepreneurPulse.com.au  Fact Sheet for Healthcare Providers: IncredibleEmployment.be  This test is not  yet approved or cleared by the [place] FDA and  has been authorized for detection and/or diagnosis of SARS-CoV-2 by FDA under an Emergency Use Authorization (EUA).  This EUA will remain in effect (meaning this test can be used) for the duration of  the COVID-19 declaration under Section 564(b)(1) of the A ct, 21 U.S.C. section 360bbb-3(b)(1), unless the authorization is terminated or revoked sooner.     Influenza A by PCR NEGATIVE NEGATIVE Final   Influenza B by PCR NEGATIVE NEGATIVE Final    Comment: (NOTE) The Xpert Xpress SARS-CoV-2/FLU/RSV plus assay is intended as an aid in the diagnosis of influenza from Nasopharyngeal swab specimens and should not be used as a sole basis for treatment. Nasal washings and aspirates are unacceptable for Xpert Xpress SARS-CoV-2/FLU/RSV testing.  Fact Sheet for Patients: EntrepreneurPulse.com.au  Fact Sheet for Healthcare Providers: IncredibleEmployment.be  This test is not yet approved or cleared by the [place] FDA and has been authorized for detection and/or diagnosis of SARS-CoV-2 by FDA under an Emergency Use Authorization (EUA). This EUA will remain in effect (meaning this test can be used) for the duration of the COVID-19 declaration under Section 564(b)(1) of the Act, 21 U.S.C. section 360bbb-3(b)(1), unless the authorization is terminated or revoked.  Performed at [facility], [address] [place], [place] [address]   Blood Culture (routine x 2)     Status: None   Collection Time: 04/09/22  9:50 PM   Specimen: BLOOD  Result Value Ref Range Status   Specimen Description BLOOD LEFT ASSIST CONTROL  Final   Special Requests   Final    BOTTLES DRAWN AEROBIC AND ANAEROBIC Blood Culture results may not be optimal due to an excessive volume of blood received in culture bottles   Culture   Final    NO GROWTH 5 DAYS Performed at [facility], [address]  [place],  [address]    Report Status 04/14/2022 FINAL  Final  Blood Culture (routine x 2)     Status: None   Collection Time: 04/09/22  9:50 PM   Specimen: BLOOD  Result Value Ref Range Status   Specimen Description BLOOD RIGHT FOREARM  Final   Special Requests   Final    BOTTLES DRAWN AEROBIC AND ANAEROBIC Blood Culture results may not be optimal due to  an excessive volume of blood received in culture bottles   Culture   Final    NO GROWTH 5 DAYS Performed at [facility], [address] [place], [place] [address]    Report Status 04/14/2022 FINAL  Final  Urine Culture     Status: None   Collection Time: 04/09/22  9:50 PM   Specimen: Urine, Random  Result Value Ref Range Status   Specimen Description   Final    URINE, RANDOM Performed at [facility], [address], [place], [place] [address]    Special Requests   Final    NONE Performed at [facility], [address] [place], [place] [address]    Culture   Final    NO GROWTH Performed at [facility], [place] [address], [place], [place] [address]    Report Status 04/11/2022 FINAL  Final         Radiology Studies: No results found.      Scheduled Meds:  amLODipine  10 mg Oral Daily   calcium carbonate  800 mg of elemental calcium Oral BID   diphenhydrAMINE  25 mg Oral QHS   doxazosin  1 mg Oral Daily   enoxaparin (LOVENOX) injection  0.5 mg/kg Subcutaneous Q24H   insulin aspart  0-20 Units Subcutaneous TID WC   insulin aspart  0-5 Units Subcutaneous QHS   insulin aspart  4 Units Subcutaneous TID WC   insulin glargine-yfgn  10 Units Subcutaneous QHS   pantoprazole  40 mg Oral BID   Continuous Infusions:     LOS: 9 days    Time spent: 35 mins     Nolberto Hanlon, MD [facility] Pager [phone]  If 7PM-7AM, please contact night-coverage [email or web address] 04/19/2022, 8:37 AM

## 2022-04-19 NOTE — Care Management Important Message (Signed)
Important Message  Patient Details  Name: Alex Smith MRN: [id number] Date of Birth: [birth year]   Medicare Important Message Given:  Yes  I reviewed the Important Message from Medicare with the patient's HCPOA, Javonne Dorko and he is agreement with the discharge. I thanked him for his time.   Juliann Pulse A Haniah Penny 04/19/2022, 2:18 PM

## 2022-04-20 DIAGNOSIS — U071 COVID-19: Secondary | ICD-10-CM | POA: Diagnosis not present

## 2022-04-20 LAB — GLUCOSE, CAPILLARY
Glucose-Capillary: 222 mg/dL — ABNORMAL HIGH (ref 70–99)
Glucose-Capillary: 168 mg/dL — ABNORMAL HIGH (ref 70–99)
Glucose-Capillary: 191 mg/dL — ABNORMAL HIGH (ref 70–99)

## 2022-04-20 MED ORDER — PANTOPRAZOLE SODIUM 40 MG PO TBEC: 40.0000 mg | DELAYED_RELEASE_TABLET | Freq: Two times a day (BID) | ORAL | Status: AC

## 2022-04-20 MED ORDER — ALUM & MAG HYDROXIDE-SIMETH 200-200-20 MG/5ML PO SUSP: 30.0000 mL | ORAL | 0 refills | Status: AC | PRN

## 2022-04-20 MED ORDER — AMLODIPINE BESYLATE 10 MG PO TABS: 10.0000 mg | ORAL_TABLET | Freq: Every day | ORAL | Status: AC

## 2022-04-20 MED ORDER — ALBUTEROL SULFATE HFA 108 (90 BASE) MCG/ACT IN AERS: 2.0000 | INHALATION_SPRAY | Freq: Four times a day (QID) | RESPIRATORY_TRACT | Status: AC | PRN

## 2022-04-20 NOTE — Progress Notes (Signed)
COVID precautions discharged per MD order via secure chat.

## 2022-04-20 NOTE — Progress Notes (Signed)
Plan to discharge to [facility].  Patient is currently waiting for transport.  Multiple attempts have been made to provide nursing report with no phone answer.

## 2022-04-20 NOTE — TOC Transition Note (Addendum)
Transition of Care [facility]) - CM/SW Discharge Note   Patient Details  Name: Alex Smith MRN: [id number] Date of Birth: Dec 18, 1934  [facility]) CM/SW Contact:  Izola Price, RN Phone Number: 04/20/2022, 9:46 AM   Clinical Narrative:  04/20/22 Patient to be discharged today and transported via [facility] to [facility]. [facility] notified per RN CM this am with approval. DC summary inboxed to [facility] via Hub.  Meyer, Arora (Son) (POA) [phone] (Mobile) was notified 04/19/22 of pending discharge and transport today. Pending room and number to call report to. Simmie Davies RN CM  1102 am. Patient is going to Room 708 and report to be called to [phone]. Updated Unit RN. Barbie Deric Bocock RN CM   205 pm [facility] called for transport to [facility]. Simmie Davies RN CM    Final next level of care: Skilled Nursing Facility Barriers to Discharge: Barriers Resolved   Patient Goals and CMS Choice   CMS Medicare.gov Compare Post Acute Care list provided to:: Other (Comment Required) (Adult child per RN CM notes.) Choice offered to / list presented to : Patient, Adult Children (Per RN CM notes.)  Discharge Placement                Patient to be transferred to facility by: [facility] Name of family member notified: [name] was notired by RN CM 04/19/22 of transfer pending for today, 04/20/22. Patient and family notified of of transfer: 04/19/22  Discharge Plan and Services                DME Arranged: N/A DME Agency: NA       HH Arranged: NA HH Agency: NA        Social Determinants of Health (SDOH) Interventions     Readmission Risk Interventions    04/13/2022    2:31 PM 08/19/2021   10:21 AM 05/11/2021   10:42 AM  Readmission Risk Prevention Plan  Transportation Screening Complete Complete Complete  PCP or Specialist Appt within 3-5 Days Complete    HRI or [place] Complete    Social Work Consult for [place] Planning/Counseling Complete     Palliative Care Screening Complete    Medication Review Press photographer) Complete Complete Complete  PCP or Specialist appointment within 3-5 days of discharge  Complete Complete  HRI or Home Care Consult  Complete Complete  SW Recovery Care/Counseling Consult  Complete Complete  Palliative Care Screening  Not Applicable Complete  Skilled Nursing Facility  Complete Complete

## 2022-04-20 NOTE — Discharge Summary (Signed)
Alex Smith DDU:[id number] DOB: Oct 29, 1934 DOA: 04/10/2022  PCP: Orvis Brill, [facility]  Admit date: 04/10/2022 Discharge date: 04/20/2022  Admitted From: home Disposition:  snf  Recommendations for Outpatient Follow-up:  Follow up with PCP in 1 week Please obtain BMP/CBC in one week Follow up with primary oncologist in  1-2 weeks      Discharge Condition:Stable CODE STATUS:full  Diet recommendation: Heart Healthy Brief/Interim Summary: PER HPI/CHART: Alex Smith is a 87 y.o. 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 He was discharged home with supportive care, no antivirals were prescribed at that time.  Patient now returns to ed 12 hours later from ALF via EMS with increase weakness and falls for further evaluation.  Please note history mostly given by son. ON further ros patient noted no n/v/d/dysuria/ increase sob but notes cough, persistent fever/fatigue generalized malaise and confusion.  He was found to have COVID 19 infection. CTA negative for PE. Patient has completed remdesivir course and steroid course. He has bee quarantine for 10 days in order to be transferred to SNF.  This ended on 04/19/2022.   COVID19: no pneumonia seen on CXR  CTA ches neg for PE.  Other imaging below Completed Remdesivir and steroid. Continue inhalers prn and incentive spirometer      Acute hypoxic respiratory failure: secondary to above.  On RA.         Generalized weakness: OT/PT recs SNF.  Quarantined for total of 10 days,  now ok to be discharged to SNF.     Likely AKI on CKD: unknown baseline Cr/GFR, currently stage IIIb.   creatinine improving slowly, close to baseline  continue to hold ACE inhibitors  F/u with pcp  Avoid nephrotoxic meds               Alzheimer's dementia: continue on aricept   Hairy cell leukemia: last received chemo in July 2016. In remission. 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 Likely etiology of anemia and thrombocytopenia    Multiple myeloma: last received treatment in 2017   HTN: continue on amlodipine  cardura. Holding ACE-I secondary to AKI on CKD     DM2: poorly controlled.  Continue insulin and po med Also use RISS      GERD: continue on PPI       Obesity: BMI 34.3. 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 ([place])   Hyperglycemia due to type 2 diabetes mellitus ([place])   Acute respiratory failure with hypoxia (HCC)   Generalized weakness   Chronic kidney disease, stage 3b [facility])    Discharge Instructions  Discharge Instructions  Diet - low sodium heart healthy   Complete by: As directed    Increase activity slowly   Complete by: As directed       Allergies as of 04/20/2022       Reactions   Rituximab Rash   Chest tightness Chest tightness   Blood-group Specific Substance Other (See Comments)   Had a post transfusion reaction of red blood cells; NOW REQUIRES WASHED BLOOD CELLS Had a post transfusion reaction of red blood cells; NOW REQUIRES WASHED BLOOD CELLS   Primaxin [imipenem] Other (See Comments)   Possible allergy Tolerates  cephalosporins   Voriconazole Other (See Comments)   Sulfa Antibiotics Itching, Rash   Sulfacetamide Sodium Itching, Rash        Medication List     STOP taking these medications    HumaLOG KwikPen 100 UNIT/ML KwikPen Generic drug: insulin lispro   insulin aspart 100 UNIT/ML injection Commonly known as: novoLOG   lisinopril 30 MG tablet Commonly known as: ZESTRIL   NON FORMULARY   NON FORMULARY   Pain Relief Extra Strength 500 MG tablet Generic drug: acetaminophen   traMADol 50 MG tablet Commonly known as: ULTRAM       TAKE these medications    albuterol 108 (90 Base) MCG/ACT inhaler Commonly known as: VENTOLIN HFA Inhale 2 puffs into the lungs every 6 (six) hours as needed for wheezing or shortness of breath.   alum & mag hydroxide-simeth 200-200-20 MG/5ML suspension Commonly known as: MAALOX/MYLANTA Take 30 mLs by mouth every 4 (four) hours as needed for indigestion or heartburn.   amLODipine 10 MG tablet Commonly known as: NORVASC Take 1 tablet (10 mg total) by mouth daily. What changed:  medication strength how much to take   aspirin EC 81 MG tablet Take 81 mg by mouth daily.   cyanocobalamin 1000 MCG tablet Commonly known as: VITAMIN B12 Take 1 tablet (1,000 mcg total) by mouth daily.   doxazosin 1 MG tablet Commonly known as: CARDURA TAKE ONE TABLET BY MOUTH EVERY DAY   famotidine 20 MG tablet Commonly known as: PEPCID Take 20 mg by mouth daily.   feeding supplement Liqd Take 237 mLs by mouth 2 (two) times daily between meals.   insulin glargine 100 UNIT/ML Solostar Pen Commonly known as: LANTUS Inject 15 Units into the skin at bedtime.   multivitamin with minerals Tabs tablet Take 1 tablet by mouth daily.   pantoprazole 40 MG tablet Commonly known as: PROTONIX Take 1 tablet (40 mg total) by mouth 2 (two) times daily.   pravastatin 20 MG tablet Commonly known as: PRAVACHOL TAKE ONE TABLET EVERY EVENING   sitaGLIPtin 50 MG  tablet Commonly known as: JANUVIA Take 50 mg by mouth daily.   tamsulosin 0.4 MG Caps capsule Commonly known as: FLOMAX TAKE 1 CAPSULE BY MOUTH EVERY DAY   vitamin E 180 MG (400 UNITS) capsule Take 400 Units by mouth daily.        Contact information for after-discharge care     Destination     [facility] Preferred SNF .   Service: Skilled Nursing Contact information: [place] [address] [phone]                    Allergies  Allergen Reactions   Rituximab Rash    Chest tightness Chest tightness   Blood-Group Specific Substance Other (See Comments)    Had a post transfusion reaction of red blood cells; NOW REQUIRES WASHED  BLOOD CELLS Had a post transfusion reaction of red blood cells; NOW REQUIRES WASHED BLOOD CELLS   Primaxin [Imipenem] Other (See Comments)    Possible allergy Tolerates cephalosporins   Voriconazole Other (See Comments)   Sulfa Antibiotics Itching and Rash   Sulfacetamide Sodium Itching and Rash    Consultations:    Procedures/Studies: US Venous Img Upper Uni Left (DVT)  Result Date: 04/15/2022 CLINICAL DATA:  LEFT upper extremity swelling EXAM: LEFT UPPER EXTREMITY VENOUS DOPPLER ULTRASOUND TECHNIQUE: Gray-scale sonography with graded compression, as well as color Doppler and duplex ultrasound were performed to evaluate the upper extremity deep venous system from the level of the subclavian vein and including the jugular, axillary, basilic, radial, ulnar and upper cephalic vein. Spectral Doppler was utilized to evaluate flow at rest and with distal augmentation maneuvers. COMPARISON:  CTA chest, 04/09/2022. FINDINGS: VENOUS Normal compressibility of the LEFT internal jugular, subclavian, axillary, cephalic, basilic, brachial, radial and ulnar veins. No filling defects to suggest DVT on grayscale or color Doppler imaging. Doppler waveforms show normal direction of venous flow, normal  respiratory plasticity and response to augmentation. Limited views of the contralateral subclavian vein are unremarkable. OTHER No evidence of superficial thrombophlebitis or abnormal fluid collection. Limitations: none IMPRESSION: No evidence of DVT or superficial thrombophlebitis within the LEFT upper extremity. Michaelle Birks, MD [facility] Electronically Signed   By: Michaelle Birks M.D.   On: 04/15/2022 15:06   CT ABDOMEN PELVIS W CONTRAST  Result Date: 04/10/2022 CLINICAL DATA:  Concern for present.  Abdominal pain. EXAM: CT ANGIOGRAPHY CHEST CT ABDOMEN AND PELVIS WITH CONTRAST TECHNIQUE: Multidetector CT imaging of the chest was performed using the standard protocol during bolus administration of intravenous contrast. Multiplanar CT image reconstructions and MIPs were obtained to evaluate the vascular anatomy. Multidetector CT imaging of the abdomen and pelvis was performed using the standard protocol during bolus administration of intravenous contrast. RADIATION DOSE REDUCTION: This exam was performed according to the departmental dose-optimization program which includes automated exposure control, adjustment of the mA and/or kV according to patient size and/or use of iterative reconstruction technique. CONTRAST:  178mL OMNIPAQUE IOHEXOL 350 MG/ML SOLN COMPARISON:  CT of the chest abdomen pelvis dated 06/01/2007. FINDINGS: Evaluation of this exam is limited due to respiratory motion artifact. CTA CHEST FINDINGS Cardiovascular: There is no cardiomegaly. Trace pericardial effusion anterior to the heart. There is coronary vascular calcification. Mild atherosclerotic calcification of the thoracic aorta. No aneurysmal dilatation or dissection. The origins of the great vessels of the aortic arch appear patent. Evaluation of the pulmonary arteries is limited due to respiratory motion. No central pulmonary artery embolus identified. Mediastinum/Nodes: No  hilar or mediastinal adenopathy. There is a small hiatal hernia. The esophagus is grossly unremarkable. No mediastinal fluid collection. Lungs/Pleura: Bibasilar linear atelectasis/scarring. No focal consolidation, pleural effusion, pneumothorax. Right upper lobe calcified granuloma. The central airways are patent. Musculoskeletal: Degenerative changes of the spine. Age indeterminate compression fracture of superior endplate of T3 with approximately 20% loss of vertebral body height. Correlation with clinical exam and point tenderness recommended. Review of the MIP images confirms the above findings. CT ABDOMEN and PELVIS FINDINGS No intra-abdominal free air or free fluid. Hepatobiliary: The liver is unremarkable. No biliary dilatation. Multiple gallstones. No pericholecystic fluid or evidence of acute cholecystitis by CT. Pancreas: Mild haziness of the fat adjacent to the distal body and tail of the pancreas. Correlation with pancreatic enzymes recommended to exclude acute pancreatitis. No dilatation of the main  pancreatic duct. No fluid collection. Spleen: Multiple small scattered calcified splenic granuloma. Adrenals/Urinary Tract: The adrenal glands unremarkable. There is a 3.5 cm left renal posterior interpolar exophytic cyst as well as several additional subcentimeter hypodense lesions which are too small to characterize. There is no hydronephrosis on either side. There is symmetric enhancement and excretion of contrast by both kidneys. The visualized ureters and urinary bladder appear unremarkable. Stomach/Bowel: There is severe sigmoid diverticulosis without active inflammatory changes. Moderate stool throughout the colon. There is no bowel obstruction or active inflammation. Normal appendix. Vascular/Lymphatic: Moderate aortoiliac atherosclerotic disease. The IVC is unremarkable. No portal venous gas. There is no adenopathy. Reproductive: The prostate and seminal vesicles are grossly unremarkable no pelvic  mass. Other: None Musculoskeletal: Osteopenia with degenerative changes of the spine. Age indeterminate L2 compression fracture with approximately 50% loss of vertebral body height and anterior wedging. Correlation with clinical exam and point tenderness recommended. No retropulsion. Review of the MIP images confirms the above findings. IMPRESSION: 1. No acute intrathoracic pathology. No CT evidence of central pulmonary artery embolus. 2. Mild haziness of the fat adjacent to the distal body and tail of the pancreas. Correlation with pancreatic enzymes recommended to exclude acute pancreatitis. 3. Cholelithiasis. 4. Severe sigmoid diverticulosis without active inflammatory changes. No bowel obstruction. Normal appendix. 5. Age indeterminate T3 and L2 compression fractures. Correlation with clinical exam and point tenderness recommended. 6. Aortic Atherosclerosis (ICD10-I70.0). Electronically Signed   By: Anner Crete M.D.   On: 04/10/2022 00:13   CT Angio Chest PE W and/or Wo Contrast  Result Date: 04/10/2022 CLINICAL DATA:  Concern for present.  Abdominal pain. EXAM: CT ANGIOGRAPHY CHEST CT ABDOMEN AND PELVIS WITH CONTRAST TECHNIQUE: Multidetector CT imaging of the chest was performed using the standard protocol during bolus administration of intravenous contrast. Multiplanar CT image reconstructions and MIPs were obtained to evaluate the vascular anatomy. Multidetector CT imaging of the abdomen and pelvis was performed using the standard protocol during bolus administration of intravenous contrast. RADIATION DOSE REDUCTION: This exam was performed according to the departmental dose-optimization program which includes automated exposure control, adjustment of the mA and/or kV according to patient size and/or use of iterative reconstruction technique. CONTRAST:  135m OMNIPAQUE IOHEXOL 350 MG/ML SOLN COMPARISON:  CT of the chest abdomen pelvis dated 06/01/2007. FINDINGS: Evaluation of this exam is limited due  to respiratory motion artifact. CTA CHEST FINDINGS Cardiovascular: There is no cardiomegaly. Trace pericardial effusion anterior to the heart. There is coronary vascular calcification. Mild atherosclerotic calcification of the thoracic aorta. No aneurysmal dilatation or dissection. The origins of the great vessels of the aortic arch appear patent. Evaluation of the pulmonary arteries is limited due to respiratory motion. No central pulmonary artery embolus identified. Mediastinum/Nodes: No hilar or mediastinal adenopathy. There is a small hiatal hernia. The esophagus is grossly unremarkable. No mediastinal fluid collection. Lungs/Pleura: Bibasilar linear atelectasis/scarring. No focal consolidation, pleural effusion, pneumothorax. Right upper lobe calcified granuloma. The central airways are patent. Musculoskeletal: Degenerative changes of the spine. Age indeterminate compression fracture of superior endplate of T3 with approximately 20% loss of vertebral body height. Correlation with clinical exam and point tenderness recommended. Review of the MIP images confirms the above findings. CT ABDOMEN and PELVIS FINDINGS No intra-abdominal free air or free fluid. Hepatobiliary: The liver is unremarkable. No biliary dilatation. Multiple gallstones. No pericholecystic fluid or evidence of acute cholecystitis by CT. Pancreas: Mild haziness of the fat adjacent to the distal body and tail of the pancreas. Correlation with pancreatic  enzymes recommended to exclude acute pancreatitis. No dilatation of the main pancreatic duct. No fluid collection. Spleen: Multiple small scattered calcified splenic granuloma. Adrenals/Urinary Tract: The adrenal glands unremarkable. There is a 3.5 cm left renal posterior interpolar exophytic cyst as well as several additional subcentimeter hypodense lesions which are too small to characterize. There is no hydronephrosis on either side. There is symmetric enhancement and excretion of contrast by  both kidneys. The visualized ureters and urinary bladder appear unremarkable. Stomach/Bowel: There is severe sigmoid diverticulosis without active inflammatory changes. Moderate stool throughout the colon. There is no bowel obstruction or active inflammation. Normal appendix. Vascular/Lymphatic: Moderate aortoiliac atherosclerotic disease. The IVC is unremarkable. No portal venous gas. There is no adenopathy. Reproductive: The prostate and seminal vesicles are grossly unremarkable no pelvic mass. Other: None Musculoskeletal: Osteopenia with degenerative changes of the spine. Age indeterminate L2 compression fracture with approximately 50% loss of vertebral body height and anterior wedging. Correlation with clinical exam and point tenderness recommended. No retropulsion. Review of the MIP images confirms the above findings. IMPRESSION: 1. No acute intrathoracic pathology. No CT evidence of central pulmonary artery embolus. 2. Mild haziness of the fat adjacent to the distal body and tail of the pancreas. Correlation with pancreatic enzymes recommended to exclude acute pancreatitis. 3. Cholelithiasis. 4. Severe sigmoid diverticulosis without active inflammatory changes. No bowel obstruction. Normal appendix. 5. Age indeterminate T3 and L2 compression fractures. Correlation with clinical exam and point tenderness recommended. 6. Aortic Atherosclerosis (ICD10-I70.0). Electronically Signed   By: Anner Crete M.D.   On: 04/10/2022 00:13   CT HEAD WO CONTRAST (5MM)  Result Date: 04/10/2022 CLINICAL DATA:  Trauma. EXAM: CT HEAD WITHOUT CONTRAST TECHNIQUE: Contiguous axial images were obtained from the base of the skull through the vertex without intravenous contrast. RADIATION DOSE REDUCTION: This exam was performed according to the departmental dose-optimization program which includes automated exposure control, adjustment of the mA and/or kV according to patient size and/or use of iterative reconstruction  technique. COMPARISON:  Head CT dated 08/18/2021. FINDINGS: Brain: Mild age-related atrophy and chronic microvascular ischemic changes. There is no acute intracranial hemorrhage. No mass effect or midline shift. No extra-axial fluid collection. Vascular: No hyperdense vessel or unexpected calcification. Skull: Normal. Negative for fracture or focal lesion. Sinuses/Orbits: There is mild diffuse mucoperiosteal thickening of paranasal sinuses. No air-fluid level. The mastoid air cells clear. Other: None IMPRESSION: 1. No acute intracranial pathology. 2. Mild age-related atrophy and chronic microvascular ischemic changes. Electronically Signed   By: Anner Crete M.D.   On: 04/10/2022 00:03   DG Chest Port 1 View  Result Date: 04/09/2022 CLINICAL DATA:  Questionable sepsis - evaluate for abnormality EXAM: PORTABLE CHEST 1 VIEW COMPARISON:  08/18/2021 FINDINGS: Prominent right cardiophrenic angle fat pad. No confluent opacities or effusions. Heart is normal size. No acute bony abnormality. IMPRESSION: No active disease. Electronically Signed   By: Rolm Baptise M.D.   On: 04/09/2022 22:17      Subjective: No  complaints.  Discharge Exam: Vitals:   04/19/22 2127 04/20/22 0810  BP: 139/72 (!) 147/65  Pulse: 96 80  Resp: 18 19  Temp: 98.7 F (37.1 C) 98.2 F (36.8 C)  SpO2: 95% 95%   Vitals:   04/19/22 0749 04/19/22 1544 04/19/22 2127 04/20/22 0810  BP: (!) 173/75 (!) 160/79 139/72 (!) 147/65  Pulse: 78 93 96 80  Resp: _0 Temp: 98.5 F (36.9 C) 98.5 F (36.9 C) 98.7 F (37.1 C) 98.2 F (36.8  C)  TempSrc:   Oral   SpO2: 99% 96% 95% 95%  Weight:      Height:        General: Pt is alert, awake, not in acute distress Cardiovascular: RRR, S1/S2 +, no rubs, no gallops Respiratory: CTA bilaterally, no wheezing, no rhonchi Abdominal: Soft, NT, ND, bowel sounds + Extremities: no edema, no cyanosis    The results of significant diagnostics from this hospitalization (including  imaging, microbiology, ancillary and laboratory) are listed below for reference.     Microbiology: No results found for this or any previous visit (from the past 240 hour(s)).   Labs: BNP (last 3 results) Recent Labs    04/11/22 0043  BNP 299.2*   Basic Metabolic Panel: Recent Labs  Lab 04/14/22 0710 04/15/22 0448 04/16/22 0543 04/17/22 0528  NA 138 137 140 138  K 4.2 4.2 3.9 3.9  CL 113* 109 114* 109  CO2 20* _0 GLUCOSE 204* 195* 117* 182*  BUN 67* 57* 49* 38*  CREATININE 1.73* 1.44* 1.55* 1.41*  CALCIUM 8.5* 8.3* 8.4* 8.2*   Liver Function Tests: Recent Labs  Lab 04/14/22 0710 04/15/22 0448  AST 37 26  ALT 31 33  ALKPHOS 54 54  BILITOT 0.6 1.0  PROT 6.3* 6.5  ALBUMIN 3.0* 3.1*   No results for input(s): "LIPASE", "AMYLASE" in the last 168 hours. No results for input(s): "AMMONIA" in the last 168 hours. CBC: Recent Labs  Lab 04/14/22 0710 04/15/22 0448 04/16/22 0543 04/17/22 0528  WBC 7.5 7.2 5.7 5.8  NEUTROABS 6.2 5.5  --   --   HGB 10.3* 10.3* 10.6* 10.4*  HCT 30.9* 31.6* 32.4* 31.1*  MCV 101.0* 100.3* 99.7 99.4  PLT 102* 110* 109* 115*   Cardiac Enzymes: No results for input(s): "CKTOTAL", "CKMB", "CKMBINDEX", "TROPONINI" in the last 168 hours. BNP: Invalid input(s): "POCBNP" CBG: Recent Labs  Lab 04/19/22 0750 04/19/22 1143 04/19/22 1543 04/19/22 2128 04/20/22 0812  GLUCAP 190* 236* 177* 71 222*   D-Dimer No results for input(s): "DDIMER" in the last 72 hours. Hgb A1c No results for input(s): "HGBA1C" in the last 72 hours. Lipid Profile No results for input(s): "CHOL", "HDL", "LDLCALC", "TRIG", "CHOLHDL", "LDLDIRECT" in the last 72 hours. Thyroid function studies No results for input(s): "TSH", "T4TOTAL", "T3FREE", "THYROIDAB" in the last 72 hours.  Invalid input(s): "FREET3" Anemia work up No results for input(s): "VITAMINB12", "FOLATE", "FERRITIN", "TIBC", "IRON", "RETICCTPCT" in the last 72 hours. Urinalysis     Component Value Date/Time   COLORURINE STRAW (A) 04/09/2022 2150   APPEARANCEUR CLEAR (A) 04/09/2022 2150   LABSPEC 1.030 04/09/2022 2150   PHURINE 5.0 04/09/2022 2150   GLUCOSEU NEGATIVE 04/09/2022 2150   HGBUR SMALL (A) 04/09/2022 2150   BILIRUBINUR NEGATIVE 04/09/2022 2150   BILIRUBINUR Negative 08/02/2016 1212   KETONESUR NEGATIVE 04/09/2022 2150   PROTEINUR 100 (A) 04/09/2022 2150   UROBILINOGEN 0.2 08/02/2016 1212   NITRITE NEGATIVE 04/09/2022 2150   LEUKOCYTESUR NEGATIVE 04/09/2022 2150   Sepsis Labs Recent Labs  Lab 04/14/22 0710 04/15/22 0448 04/16/22 0543 04/17/22 0528  WBC 7.5 7.2 5.7 5.8   Microbiology No results found for this or any previous visit (from the past 240 hour(s)).   Time coordinating discharge: Over 30 minutes  SIGNED:   Nolberto Hanlon, MD  [facility] 04/20/2022, 9:24 AM Pager   If 7PM-7AM, please contact night-coverage [email or web address] Password [email or web address]

## 2022-04-29 ENCOUNTER — Inpatient Hospital Stay: Payer: Medicare Other | Admitting: Oncology

## 2022-05-14 ENCOUNTER — Inpatient Hospital Stay: Payer: Medicare Other | Attending: Nurse Practitioner

## 2022-05-14 ENCOUNTER — Inpatient Hospital Stay: Payer: Medicare Other

## 2022-05-14 DIAGNOSIS — D46Z Other myelodysplastic syndromes: Secondary | ICD-10-CM | POA: Diagnosis present

## 2022-05-14 LAB — CBC WITH DIFFERENTIAL/PLATELET
Abs Immature Granulocytes: 0.01 10*3/uL (ref 0.00–0.07)
Basophils Absolute: 0 10*3/uL (ref 0.0–0.1)
Basophils Relative: 0 %
Eosinophils Absolute: 0.1 10*3/uL (ref 0.0–0.5)
Eosinophils Relative: 2 %
HCT: 25.9 % — ABNORMAL LOW (ref 39.0–52.0)
Hemoglobin: 8.4 g/dL — ABNORMAL LOW (ref 13.0–17.0)
Immature Granulocytes: 0 %
Lymphocytes Relative: 30 %
Lymphs Abs: 0.8 10*3/uL (ref 0.7–4.0)
MCH: 33.9 pg (ref 26.0–34.0)
MCHC: 32.4 g/dL (ref 30.0–36.0)
MCV: 104.4 fL — ABNORMAL HIGH (ref 80.0–100.0)
Monocytes Absolute: 0.2 10*3/uL (ref 0.1–1.0)
Monocytes Relative: 8 %
Neutro Abs: 1.5 10*3/uL — ABNORMAL LOW (ref 1.7–7.7)
Neutrophils Relative %: 60 %
Platelets: 93 10*3/uL — ABNORMAL LOW (ref 150–400)
RBC: 2.48 MIL/uL — ABNORMAL LOW (ref 4.22–5.81)
RDW: 17.8 % — ABNORMAL HIGH (ref 11.5–15.5)
WBC: 2.6 10*3/uL — ABNORMAL LOW (ref 4.0–10.5)
nRBC: 0.8 % — ABNORMAL HIGH (ref 0.0–0.2)

## 2022-05-14 LAB — IRON AND TIBC
Iron: 81 ug/dL (ref 45–182)
Saturation Ratios: 27 % (ref 17.9–39.5)
TIBC: 300 ug/dL (ref 250–450)
UIBC: 219 ug/dL

## 2022-05-14 LAB — FERRITIN: Ferritin: 112 ng/mL (ref 24–336)

## 2022-05-14 MED ORDER — LUSPATERCEPT-AAMT 75 MG ~~LOC~~ SOLR
100.0000 mg | Freq: Once | SUBCUTANEOUS | Status: AC
  Administered 2022-05-14: 100 mg via SUBCUTANEOUS
  Filled 2022-05-14: qty 2

## 2022-05-14 MED ORDER — LUSPATERCEPT-AAMT 75 MG ~~LOC~~ SOLR: 100.0000 mg | SUBCUTANEOUS | Status: DC

## 2022-05-14 MED ORDER — LUSPATERCEPT-AAMT 25 MG ~~LOC~~ SOLR
25.0000 mg | Freq: Once | SUBCUTANEOUS | Status: DC
  Filled 2022-05-14: qty 0.5

## 2022-05-14 MED ORDER — LUSPATERCEPT-AAMT 75 MG ~~LOC~~ SOLR
75.0000 mg | SUBCUTANEOUS | Status: DC
  Filled 2022-05-14: qty 1.5

## 2022-05-15 ENCOUNTER — Ambulatory Visit: Payer: Medicare Other | Admitting: Internal Medicine

## 2022-05-15 DIAGNOSIS — F331 Major depressive disorder, recurrent, moderate: Secondary | ICD-10-CM | POA: Insufficient documentation

## 2022-05-15 LAB — KAPPA/LAMBDA LIGHT CHAINS
Kappa free light chain: 71.9 mg/L — ABNORMAL HIGH (ref 3.3–19.4)
Kappa, lambda light chain ratio: 4.79 — ABNORMAL HIGH (ref 0.26–1.65)
Lambda free light chains: 15 mg/L (ref 5.7–26.3)

## 2022-05-17 LAB — MULTIPLE MYELOMA PANEL, SERUM
Albumin SerPl Elph-Mcnc: 3.2 g/dL (ref 2.9–4.4)
Albumin/Glob SerPl: 1.1 (ref 0.7–1.7)
Alpha 1: 0.3 g/dL (ref 0.0–0.4)
Alpha2 Glob SerPl Elph-Mcnc: 0.7 g/dL (ref 0.4–1.0)
B-Globulin SerPl Elph-Mcnc: 0.7 g/dL (ref 0.7–1.3)
Gamma Glob SerPl Elph-Mcnc: 1.4 g/dL (ref 0.4–1.8)
Globulin, Total: 3.1 g/dL (ref 2.2–3.9)
IgA: 18 mg/dL — ABNORMAL LOW (ref 61–437)
IgG (Immunoglobin G), Serum: 1591 mg/dL (ref 603–1613)
IgM (Immunoglobulin M), Srm: 24 mg/dL (ref 15–143)
M Protein SerPl Elph-Mcnc: 1.1 g/dL — ABNORMAL HIGH
Total Protein ELP: 6.3 g/dL (ref 6.0–8.5)

## 2022-05-21 ENCOUNTER — Other Ambulatory Visit: Payer: Self-pay

## 2022-05-21 DIAGNOSIS — D46Z Other myelodysplastic syndromes: Secondary | ICD-10-CM

## 2022-05-22 ENCOUNTER — Encounter: Payer: Self-pay | Admitting: Oncology

## 2022-05-22 ENCOUNTER — Inpatient Hospital Stay (HOSPITAL_BASED_OUTPATIENT_CLINIC_OR_DEPARTMENT_OTHER): Payer: Medicare Other | Admitting: Oncology

## 2022-05-22 ENCOUNTER — Inpatient Hospital Stay: Payer: Medicare Other | Attending: Nurse Practitioner

## 2022-05-22 ENCOUNTER — Ambulatory Visit: Payer: Medicare Other

## 2022-05-22 VITALS — BP 139/86 | HR 73 | Temp 98.7°F | Resp 18 | Wt 201.1 lb

## 2022-05-22 DIAGNOSIS — D46Z Other myelodysplastic syndromes: Secondary | ICD-10-CM | POA: Diagnosis not present

## 2022-05-22 DIAGNOSIS — D472 Monoclonal gammopathy: Secondary | ICD-10-CM

## 2022-05-22 DIAGNOSIS — Z79899 Other long term (current) drug therapy: Secondary | ICD-10-CM | POA: Diagnosis not present

## 2022-05-22 LAB — CBC WITH DIFFERENTIAL/PLATELET
Abs Immature Granulocytes: 0.02 10*3/uL (ref 0.00–0.07)
Basophils Absolute: 0 10*3/uL (ref 0.0–0.1)
Basophils Relative: 1 %
Eosinophils Absolute: 0.1 10*3/uL (ref 0.0–0.5)
Eosinophils Relative: 3 %
HCT: 28.2 % — ABNORMAL LOW (ref 39.0–52.0)
Hemoglobin: 9.4 g/dL — ABNORMAL LOW (ref 13.0–17.0)
Immature Granulocytes: 1 %
Lymphocytes Relative: 32 %
Lymphs Abs: 1.2 10*3/uL (ref 0.7–4.0)
MCH: 34.1 pg — ABNORMAL HIGH (ref 26.0–34.0)
MCHC: 33.3 g/dL (ref 30.0–36.0)
MCV: 102.2 fL — ABNORMAL HIGH (ref 80.0–100.0)
Monocytes Absolute: 0.3 10*3/uL (ref 0.1–1.0)
Monocytes Relative: 8 %
Neutro Abs: 2.1 10*3/uL (ref 1.7–7.7)
Neutrophils Relative %: 55 %
Platelets: 101 10*3/uL — ABNORMAL LOW (ref 150–400)
RBC: 2.76 MIL/uL — ABNORMAL LOW (ref 4.22–5.81)
RDW: 16.9 % — ABNORMAL HIGH (ref 11.5–15.5)
WBC: 3.7 10*3/uL — ABNORMAL LOW (ref 4.0–10.5)
nRBC: 0.5 % — ABNORMAL HIGH (ref 0.0–0.2)

## 2022-05-22 LAB — IRON AND TIBC
Iron: 85 ug/dL (ref 45–182)
Saturation Ratios: 29 % (ref 17.9–39.5)
TIBC: 297 ug/dL (ref 250–450)
UIBC: 212 ug/dL

## 2022-05-22 LAB — FERRITIN: Ferritin: 94 ng/mL (ref 24–336)

## 2022-05-22 NOTE — Progress Notes (Signed)
Hematology/Oncology Consult note [facility]  Telephone:([phone] [phone]  Patient Care Team: [facility] as PCP - General (Geriatric Medicine) Crissie Sickles, MD as Referring Physician (Hematology and Oncology) Crissie Sickles, MD as Referring Physician (Hematology and Oncology)   Name of the patient: Alex Smith  [id number]  05-Dec-1934   Date of visit: 05/22/22  Diagnosis- History of hairy cell leukemia 2.  Smoldering multiple myeloma under observation 3.  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 [name] and is transitioning his care to me.  He has following issues:   1.  Hairy cell leukemia that was initially diagnosed on bone marrow biopsy in June 2016.  Bone marrow biopsy at that time showed extensive marrow involvement with recurrent hairy cell leukemia approximately 80% of the cells in the core 10% monoclonal plasma cell infiltrate compatible with plasma cell neoplasm.  Peripheral smear revealed leukopenia with a white count of 1900 compatible with hairy cell leukemia. 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  FISH studies for MDS were negative.  Cytogenetics were normal (46, XY).  He has last received cladribine for this back in July 2016   2.  Smoldering multiple myeloma: Bone marrow biopsy in March 2017 revealed persistent plasma cell neoplasm with monoclonal plasma cells estimated at 20 to 30%.  No morphologic evidence of residual hairy cell leukemia.Marrow was normocellular for age with relative erythroid hyperplasia, relative myeloid hypoplasia, mild dyspoiesis, adequate megakaryocytes and no increased blasts.  There was no significant increase in marrow reticulin fibers.  Iron was  present.  FISH studies for MDS were negative.  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  Cytogenetics were normal (46, XY).  Last bone marrow biopsy was in October 2018 which showed variably cellular marrow 10 to 50% with kappa restricted plasmacytosis of 10 to 15%.  No evidence of leukemia lymphoma or high-grade dysplasia.  Normal cytogenetics.  He has also been followed by Dr. [name] at [facility].  He has received Revlimid and Decadron in the past in 2017 which was subsequently stopped.  PET scan in July 2017 showed no hypermetabolic adenopathy or focal skeletal lesions.   3.  Probable MDS: He has received a trial of Procrit in the past in 2017 but apparently did not respond to it.  He has been getting periodic blood transfusions to maintain his hemoglobin closer to 9 as he is symptomatic when his hemoglobin is less than 9.  He has been getting about 2-3 blood transfusions during the year.  He does have some mild leukopenia/neutropenia and is on acyclovir prophylaxis.  He gets weekly G-CSF when his ANC is less than 1.5.   4.  Also has history of B12 deficiency for which he is on oral B12.    Interval history-patient was recently hospitalized for acute COVID infection for 8 days.  He was eventually discharged to a rehab and is now back to his assisted living facility.  Reports ongoing fatigue.  Denies any new complaints at this time.  ECOG PS- 2 Pain scale- 0   Review of systems- Review of Systems  Constitutional:  Positive for malaise/fatigue. Negative for chills, fever and weight loss.  HENT:  Negative for congestion, ear discharge and  nosebleeds.   Eyes:  Negative for blurred vision.  Respiratory:  Negative for cough, hemoptysis, sputum production, shortness of breath and wheezing.   Cardiovascular:  Negative for chest pain, palpitations, orthopnea and claudication.  Gastrointestinal:  Negative for abdominal pain, blood in stool, constipation,  diarrhea, heartburn, melena, nausea and vomiting.  Genitourinary:  Negative for dysuria, flank pain, frequency, hematuria and urgency.  Musculoskeletal:  Negative for back pain, joint pain and myalgias.  Skin:  Negative for rash.  Neurological:  Negative for dizziness, tingling, focal weakness, seizures, weakness and headaches.  Endo/Heme/Allergies:  Does not bruise/bleed easily.  Psychiatric/Behavioral:  Negative for depression and suicidal ideas. The patient does not have insomnia.       Allergies  Allergen Reactions   Rituximab Rash    Chest tightness Chest tightness   Blood-Group Specific Substance Other (See Comments)    Had a post transfusion reaction of red blood cells; NOW REQUIRES WASHED BLOOD CELLS Had a post transfusion reaction of red blood cells; NOW REQUIRES WASHED BLOOD CELLS   Primaxin [Imipenem] Other (See Comments)    Possible allergy Tolerates cephalosporins   Voriconazole Other (See Comments)   Sulfa Antibiotics Itching and Rash   Sulfacetamide Sodium Itching and Rash     Past Medical History:  Diagnosis Date   Alzheimer's dementia ([place]) 08/18/2021   Anemia    B12 deficiency    Basal cell carcinoma 03/30/2018   left ant nasal ala   Basal cell carcinoma of face 01/19/2013   right distal dorsum nose   BPH (benign prostatic hypertrophy)    followed by urology, discharged (Dr. Bernardo Heater)   CAP (community acquired pneumonia) 02/15/2015   CKD stage 3 due to type 2 diabetes mellitus ([place]) 11/14/2017   Colon polyps    Diverticulosis    Dysplastic nevus 10/27/2006   right med calf   GERD (gastroesophageal reflux disease)    Hairy cell leukemia ([place]) 2006   recurrent, seizure on rituxan, now on cladribine [name])   History of pneumonia 2000's   "once" (07/07/2012)   History of shingles    HLD (hyperlipidemia)    Hypertension    Pneumonia    Shortness of breath dyspnea    Squamous cell carcinoma in situ 07/05/2020   Left lat. pretibial. EDC 09/07/2020    Squamous cell carcinoma in situ 07/05/2020   Right medial pretibial. EDC 09/07/2020   Squamous cell carcinoma of skin 04/03/2020   Left cheek. WD SCC   Systolic murmur 85/27/7824   Type 2 diabetes, controlled, with retinopathy ([place])      Past Surgical History:  Procedure Laterality Date   25 GAUGE PARS PLANA VITRECTOMY WITH 20 GAUGE MVR PORT FOR MACULAR HOLE  07/07/2012   Procedure: 25 GAUGE PARS PLANA VITRECTOMY WITH 20 GAUGE MVR PORT FOR MACULAR HOLE;  Surgeon: Hayden Pedro, MD;  Location: [place];  Service: Ophthalmology;  Laterality: Left;   BONE MARROW BIOPSY  2016   CARDIOVASCULAR STRESS TEST  2013   treadmill - no evidence ischemia, EF 61%   CATARACT EXTRACTION W/ INTRAOCULAR LENS  IMPLANT, BILATERAL  ~ 2010   COLONOSCOPY  2014   [place] WNL no rpt needed, h/o polyps   EYE SURGERY Left 06/2012   laser surgery   GAS INSERTION  07/07/2012   Procedure: INSERTION OF GAS;  Surgeon: Hayden Pedro, MD;  Location: [place];  Service: Ophthalmology;  Laterality: Left;  [id number]   PERIPHERAL VASCULAR CATHETERIZATION N/A 02/23/2015   Procedure: Glori Luis Cath Insertion;  Surgeon: Algernon Huxley, MD;  Location: [place] [facility];  Service: Cardiovascular;  Laterality: N/A;   PORTA CATH REMOVAL N/A 02/16/2020   Procedure: PORTA CATH REMOVAL;  Surgeon: Algernon Huxley, MD;  Location: [place] [facility];  Service: Cardiovascular;  Laterality: N/A;   SERUM PATCH  07/07/2012   Procedure: SERUM PATCH;  Surgeon: Hayden Pedro, MD;  Location: [place];  Service: Ophthalmology;  Laterality: Left;   SKIN CANCER EXCISION     "all over my face" (07/07/2012)    Social History   Socioeconomic History   Marital status: Married    Spouse name: Not on file   Number of children: Not on file   Years of education: Not on file   Highest education level: Not on file  Occupational History   Not on file  Tobacco Use   Smoking status: Former    Packs/day: 2.00    Years: 25.00    Total pack years: 50.00    Types:  Cigarettes    Quit date: 02/06/1969    Years since quitting: 53.3   Smokeless tobacco: Never   Tobacco comments:    07/07/2012 "stopped smoking ~ 40 yr ago; smoked 20-7yr  Vaping Use   Vaping Use: Never used  Substance and Sexual Activity   Alcohol use: Not Currently   Drug use: No   Sexual activity: Never  Other Topics Concern   Not on file  Social History Narrative   Lives at home alone. 1 cat   Occupation: was cArt gallery manager works part time for [employer]   Activity: likes to golf   Diet: moderate water, fruits/vegetables daily   Social Determinants of HRadio broadcast assistantStrain: Not on file  Food Insecurity: Not on file  Transportation Needs: Not on file  Physical Activity: Not on file  Stress: Not on file  Social Connections: Not on file  Intimate Partner Violence: Not on file    Family History  Problem Relation Age of Onset   Dementia Mother    Heart failure Father 754  Cancer Sister        breast   Diabetes Paternal Uncle    Diabetes Paternal Aunt    CAD Brother 471      MI   Stroke Neg Hx      Current Outpatient Medications:    albuterol (VENTOLIN HFA) 108 (90 Base) MCG/ACT inhaler, Inhale 2 puffs into the lungs every 6 (six) hours as needed for wheezing or shortness of breath., Disp: , Rfl:    alum & mag hydroxide-simeth (MAALOX/MYLANTA) 200-200-20 MG/5ML suspension, Take 30 mLs by mouth every 4 (four) hours as needed for indigestion or heartburn., Disp: 355 mL, Rfl: 0   amLODipine (NORVASC) 10 MG tablet, Take 1 tablet (10 mg total) by mouth daily., Disp: , Rfl:    aspirin EC 81 MG tablet, Take 81 mg by mouth daily., Disp: , Rfl:    doxazosin (CARDURA) 1 MG tablet, TAKE ONE TABLET BY MOUTH EVERY DAY, Disp: 30 tablet, Rfl: 5   famotidine (PEPCID) 20 MG tablet, Take 20 mg by mouth daily., Disp: , Rfl:    feeding supplement (ENSURE ENLIVE / ENSURE PLUS) LIQD, Take 237 mLs by mouth 2 (two) times daily between meals., Disp: 237 mL, Rfl: 12    insulin glargine (LANTUS) 100 UNIT/ML Solostar Pen, Inject 15 Units into the skin at bedtime., Disp: , Rfl:    Multiple Vitamin (MULTIVITAMIN WITH MINERALS) TABS tablet, Take 1  tablet by mouth daily., Disp: , Rfl:    pantoprazole (PROTONIX) 40 MG tablet, Take 1 tablet (40 mg total) by mouth 2 (two) times daily., Disp: , Rfl:    pravastatin (PRAVACHOL) 20 MG tablet, TAKE ONE TABLET EVERY EVENING, Disp: 30 tablet, Rfl: 0   sitaGLIPtin (JANUVIA) 50 MG tablet, Take 50 mg by mouth daily., Disp: , Rfl:    tamsulosin (FLOMAX) 0.4 MG CAPS capsule, TAKE 1 CAPSULE BY MOUTH EVERY DAY, Disp: 30 capsule, Rfl: 3   vitamin B-12 (CYANOCOBALAMIN) 1000 MCG tablet, Take 1 tablet (1,000 mcg total) by mouth daily., Disp: 90 tablet, Rfl: 3   vitamin E 400 UNIT capsule, Take 400 Units by mouth daily., Disp: , Rfl:  No current facility-administered medications for this visit.  Facility-Administered Medications Ordered in Other Visits:    heparin lock flush 100 unit/mL, 500 Units, Intravenous, Once, Corcoran, Melissa C, MD   sodium chloride 0.9 % injection 10 mL, 10 mL, Intravenous, PRN, [name], Melissa C, MD, 10 mL at 03/03/15 0903   sodium chloride 0.9 % injection 10 mL, 10 mL, Intracatheter, PRN, [name], Melissa C, MD, 10 mL at 03/10/15 1410   sodium chloride flush (NS) 0.9 % injection 10 mL, 10 mL, Intravenous, PRN, [name], Melissa C, MD, 10 mL at 07/23/18 1517  Physical exam:  Vitals:   05/22/22 1419  BP: 139/86  Pulse: 73  Resp: 18  Temp: 98.7 F (37.1 C)  SpO2: 98%  Weight: 201 lb 1.6 oz (91.2 kg)   Physical Exam Constitutional:      General: He is not in acute distress. Cardiovascular:     Rate and Rhythm: Normal rate and regular rhythm.     Heart sounds: Murmur heard.  Pulmonary:     Effort: Pulmonary effort is normal.     Breath sounds: Normal breath sounds.  Musculoskeletal:     Right lower leg: Edema present.     Left lower leg: Edema present.  Skin:    General: Skin is warm and dry.   Neurological:     Mental Status: He is alert and oriented to person, place, and time.         Latest Ref Rng & Units 04/17/2022    5:28 AM  CMP  Glucose 70 - 99 mg/dL 182   BUN 8 - 23 mg/dL 38   Creatinine 0.61 - 1.24 mg/dL 1.41   Sodium 135 - 145 mmol/L 138   Potassium 3.5 - 5.1 mmol/L 3.9   Chloride 98 - 111 mmol/L 109   CO2 22 - 32 mmol/L 26   Calcium 8.9 - 10.3 mg/dL 8.2       Latest Ref Rng & Units 05/22/2022    1:47 PM  CBC  WBC 4.0 - 10.5 K/uL 3.7   Hemoglobin 13.0 - 17.0 g/dL 9.4   Hematocrit 39.0 - 52.0 % 28.2   Platelets 150 - 400 K/uL 101     Assessment and plan- Patient is a 87 y.o. male who is here for following issues:  MDS: Patient is currently on Luspatercept with a hemoglobin that has remained stable between 9.5-10.5.  It did drop down to 8.4 when patient was hospitalized for COVID but it is picking back up.  He has some baseline thrombocytopenia and leukopenia secondary to MDS as well which has remained stable.  Smoldering multiple myeloma: Free light chain ratio remained stable between 3-5 with no clear rising trends.  Myeloma panel is currently pending.  Continue to monitor.   CBC  with differential and Luspatercept on 06/11/2022 and he will continue to receive it every 4 weeks.  CBC with differential each time.    See Dr. Janese Banks in 3 months with CBC with differential CMP myeloma panel and serum free light chains when he would be due for his next Luspatercept dose sometime in January 2024.   Visit Diagnosis 1. 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 High risk medication use   3. Smoldering multiple myeloma      Dr. Randa Evens, MD, MPH [facility] [phone] 05/22/2022 4:54 PM

## 2022-05-23 LAB — KAPPA/LAMBDA LIGHT CHAINS
Kappa free light chain: 58.7 mg/L — ABNORMAL HIGH (ref 3.3–19.4)
Kappa, lambda light chain ratio: 3.91 — ABNORMAL HIGH (ref 0.26–1.65)
Lambda free light chains: 15 mg/L (ref 5.7–26.3)

## 2022-05-26 ENCOUNTER — Encounter: Payer: Self-pay | Admitting: Hematology and Oncology

## 2022-05-27 LAB — MULTIPLE MYELOMA PANEL, SERUM
Albumin SerPl Elph-Mcnc: 3.2 g/dL (ref 2.9–4.4)
Albumin/Glob SerPl: 1.1 (ref 0.7–1.7)
Alpha 1: 0.2 g/dL (ref 0.0–0.4)
Alpha2 Glob SerPl Elph-Mcnc: 0.6 g/dL (ref 0.4–1.0)
B-Globulin SerPl Elph-Mcnc: 0.7 g/dL (ref 0.7–1.3)
Gamma Glob SerPl Elph-Mcnc: 1.4 g/dL (ref 0.4–1.8)
Globulin, Total: 3 g/dL (ref 2.2–3.9)
IgA: 17 mg/dL — ABNORMAL LOW (ref 61–437)
IgG (Immunoglobin G), Serum: 1607 mg/dL (ref 603–1613)
IgM (Immunoglobulin M), Srm: 29 mg/dL (ref 15–143)
M Protein SerPl Elph-Mcnc: 1.2 g/dL — ABNORMAL HIGH
Total Protein ELP: 6.2 g/dL (ref 6.0–8.5)

## 2022-06-17 ENCOUNTER — Encounter (INDEPENDENT_AMBULATORY_CARE_PROVIDER_SITE_OTHER): Payer: Self-pay

## 2022-06-18 ENCOUNTER — Inpatient Hospital Stay: Payer: Medicare Other

## 2022-06-18 ENCOUNTER — Other Ambulatory Visit: Payer: Medicare Other

## 2022-06-18 VITALS — BP 181/73 | HR 88 | Temp 100.0°F

## 2022-06-18 DIAGNOSIS — D46Z Other myelodysplastic syndromes: Secondary | ICD-10-CM

## 2022-06-18 LAB — CBC WITH DIFFERENTIAL/PLATELET
Abs Immature Granulocytes: 0.01 10*3/uL (ref 0.00–0.07)
Basophils Absolute: 0 10*3/uL (ref 0.0–0.1)
Basophils Relative: 1 %
Eosinophils Absolute: 0.2 10*3/uL (ref 0.0–0.5)
Eosinophils Relative: 4 %
HCT: 28.2 % — ABNORMAL LOW (ref 39.0–52.0)
Hemoglobin: 9 g/dL — ABNORMAL LOW (ref 13.0–17.0)
Immature Granulocytes: 0 %
Lymphocytes Relative: 31 %
Lymphs Abs: 1.2 10*3/uL (ref 0.7–4.0)
MCH: 33.1 pg (ref 26.0–34.0)
MCHC: 31.9 g/dL (ref 30.0–36.0)
MCV: 103.7 fL — ABNORMAL HIGH (ref 80.0–100.0)
Monocytes Absolute: 0.3 10*3/uL (ref 0.1–1.0)
Monocytes Relative: 7 %
Neutro Abs: 2.1 10*3/uL (ref 1.7–7.7)
Neutrophils Relative %: 57 %
Platelets: 107 10*3/uL — ABNORMAL LOW (ref 150–400)
RBC: 2.72 MIL/uL — ABNORMAL LOW (ref 4.22–5.81)
RDW: 16 % — ABNORMAL HIGH (ref 11.5–15.5)
WBC: 3.7 10*3/uL — ABNORMAL LOW (ref 4.0–10.5)
nRBC: 0 % (ref 0.0–0.2)

## 2022-06-18 MED ORDER — LUSPATERCEPT-AAMT 75 MG ~~LOC~~ SOLR
100.0000 mg | SUBCUTANEOUS | Status: DC
  Administered 2022-06-18: 100 mg via SUBCUTANEOUS
  Filled 2022-06-18: qty 0.5

## 2022-07-09 ENCOUNTER — Telehealth: Payer: Self-pay | Admitting: *Deleted

## 2022-07-09 NOTE — Telephone Encounter (Signed)
Called pt's son back and let him know that dr Janese Banks signed the request and I filled out page 4 and faxed it to the [facility] and then I put the original in envelope for the son. I sent it to the address in the system and I did aske the son if that is ok and that is the son's address so that he can get bills while pt is in asst. living

## 2022-07-18 ENCOUNTER — Ambulatory Visit: Payer: Medicare Other | Admitting: Dermatology

## 2022-07-18 VITALS — BP 155/71 | Temp 81.0°F

## 2022-07-18 DIAGNOSIS — L570 Actinic keratosis: Secondary | ICD-10-CM

## 2022-07-18 DIAGNOSIS — Z1283 Encounter for screening for malignant neoplasm of skin: Secondary | ICD-10-CM

## 2022-07-18 DIAGNOSIS — D229 Melanocytic nevi, unspecified: Secondary | ICD-10-CM

## 2022-07-18 DIAGNOSIS — L280 Lichen simplex chronicus: Secondary | ICD-10-CM | POA: Diagnosis not present

## 2022-07-18 DIAGNOSIS — L578 Other skin changes due to chronic exposure to nonionizing radiation: Secondary | ICD-10-CM | POA: Diagnosis not present

## 2022-07-18 DIAGNOSIS — L814 Other melanin hyperpigmentation: Secondary | ICD-10-CM

## 2022-07-18 DIAGNOSIS — L821 Other seborrheic keratosis: Secondary | ICD-10-CM | POA: Diagnosis not present

## 2022-07-18 DIAGNOSIS — Z79899 Other long term (current) drug therapy: Secondary | ICD-10-CM

## 2022-07-18 DIAGNOSIS — Z85828 Personal history of other malignant neoplasm of skin: Secondary | ICD-10-CM

## 2022-07-18 DIAGNOSIS — Z86018 Personal history of other benign neoplasm: Secondary | ICD-10-CM

## 2022-07-18 DIAGNOSIS — L820 Inflamed seborrheic keratosis: Secondary | ICD-10-CM | POA: Diagnosis not present

## 2022-07-18 DIAGNOSIS — Z8589 Personal history of malignant neoplasm of other organs and systems: Secondary | ICD-10-CM

## 2022-07-18 NOTE — Progress Notes (Signed)
Follow-Up Visit   Subjective  Alex Smith is a 87 y.o. male who presents for the following: Annual Exam. Hx of skin cancer, Patient c/o itchy skin.  The patient presents for Total-Body Skin Exam (TBSE) for skin cancer screening and mole check.  The patient has spots, moles and lesions to be evaluated, some may be new or changing and the patient has concerns that these could be cancer.   Son with patient   The following portions of the chart were reviewed this encounter and updated as appropriate:   Tobacco  Allergies  Meds  Problems  Med Hx  Surg Hx  Fam Hx     Review of Systems:  No other skin or systemic complaints except as noted in HPI or Assessment and Plan.  Objective  Well appearing patient in no apparent distress; mood and affect are within normal limits.  A full examination was performed including scalp, head, eyes, ears, nose, lips, neck, chest, axillae, abdomen, back, buttocks, bilateral upper extremities, bilateral lower extremities, hands, feet, fingers, toes, fingernails, and toenails. All findings within normal limits unless otherwise noted below.  right posterior neck, abdomen,face, Crusted papule   scalp x 2 Stuck-on, waxy, tan-brown papules -- Discussed benign etiology and prognosis.   face, ears x 6 (6) Erythematous thin papules/macules with gritty scale.    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  It is very difficult to treat.   LN2 x 1 at right posterior neck   Start Skin Medicinals anti itch Amitriptyline/Gabapentin/Lidocaine cream bid until clear then prn flares   May consider Dupixent in future, info given   Destruction of lesion - right posterior neck,  abdomen,face, Complexity: simple   Destruction method: cryotherapy   Informed consent: discussed and consent obtained   Timeout:  patient name, date of birth, surgical site, and procedure verified Lesion destroyed using liquid nitrogen: Yes   Region frozen until ice ball extended beyond lesion: Yes   Outcome: patient tolerated procedure well with no complications   Post-procedure details: wound care instructions given    Inflamed seborrheic keratosis scalp x 2 Symptomatic, irritating, patient would like treated.  Destruction of lesion - scalp x 2 Complexity: simple   Destruction method: cryotherapy   Informed consent: discussed and consent obtained   Timeout:  patient name, date of birth, surgical site, and procedure verified Lesion destroyed using liquid nitrogen: Yes   Region frozen until ice ball extended beyond lesion: Yes   Outcome: patient tolerated procedure well with no complications   Post-procedure details: wound care instructions given    AK (actinic keratosis) face, ears x 6 Actinic keratoses are precancerous spots that appear secondary to cumulative UV radiation exposure/sun exposure over time. They are chronic with expected duration over 1 year. A portion of actinic keratoses will progress to squamous cell carcinoma of the skin. It is not possible to reliably predict which spots will progress to skin cancer and so treatment is recommended to prevent development of skin cancer.  Recommend daily broad spectrum sunscreen SPF 30+ to sun-exposed areas, reapply every 2 hours as needed.  Recommend staying in the shade or wearing long sleeves, sun glasses (UVA+UVB protection) and wide brim hats (4-inch brim around the entire circumference of the hat). Call for new or changing lesions.  Destruction of lesion - face, ears x 6 Complexity: simple   Destruction method: cryotherapy   Informed consent: discussed and consent obtained   Timeout:  patient name, date of birth, surgical  site, and procedure verified Lesion destroyed using liquid nitrogen: Yes   Region frozen until ice ball extended beyond lesion: Yes   Outcome: patient tolerated procedure well with no complications   Post-procedure details: wound care instructions given    Lentigines - Scattered tan macules - Due to sun exposure - Benign-appearing, observe - Recommend daily broad spectrum sunscreen SPF 30+ to sun-exposed areas, reapply every 2 hours as needed. - Call for any changes  Seborrheic Keratoses - Stuck-on, waxy, tan-brown papules and/or plaques  - Benign-appearing - Discussed benign etiology and prognosis. - Observe - Call for any changes  Melanocytic Nevi - Tan-brown and/or pink-flesh-colored symmetric macules and papules - Benign appearing on exam today - Observation - Call clinic for new or changing moles - Recommend daily use of broad spectrum spf 30+ sunscreen to sun-exposed areas.   Hemangiomas - Red papules - Discussed benign nature - Observe - Call for any changes  Actinic Damage - Chronic condition, secondary to cumulative UV/sun exposure - diffuse scaly erythematous macules with underlying dyspigmentation - Recommend daily broad spectrum sunscreen SPF 30+ to sun-exposed areas, reapply every 2 hours as needed.  - Staying in the shade or wearing long sleeves, sun glasses (UVA+UVB protection) and wide brim hats (4-inch brim around the entire circumference of the hat) are also recommended for sun protection.  - Call for new or changing lesions.  Skin cancer screening performed today.   History of Dysplastic Nevi - No evidence of recurrence today - Recommend regular full body skin exams - Recommend daily broad spectrum sunscreen SPF 30+ to sun-exposed areas, reapply every 2 hours as needed.  - Call if any new or changing lesions are noted between office visits   History of Basal Cell Carcinoma of the Skin Multiple see history  - No evidence of recurrence today -  Recommend regular full body skin exams - Recommend daily broad spectrum sunscreen SPF 30+ to sun-exposed areas, reapply every 2 hours as needed.  - Call if any new or changing lesions are noted between office visits   History of Squamous Cell Carcinoma of the Skin Multiple see history  - No evidence of recurrence today - No lymphadenopathy - Recommend regular full body skin exams - Recommend daily broad spectrum sunscreen SPF 30+ to sun-exposed areas, reapply every 2 hours as needed.  - Call if any new or changing lesions are noted between office visits  Return in about 1 year (around 07/19/2023) for TBSE, hx of skin cancer.  IMarye Round, CMA, am acting as scribe for Sarina Ser, MD .  Documentation: I have reviewed the above documentation for accuracy and completeness, and I agree with the above.  Sarina Ser, MD

## 2022-07-18 NOTE — Patient Instructions (Addendum)
Instructions for [facility] Medications For itch cream  One or more of your medications was sent to the [facility] mail order compounding pharmacy. You will receive an email from them and can purchase the medicine through that link. It will then be mailed to your home at the address you confirmed. If for any reason you do not receive an email from them, please check your spam folder. If you still do not find the email, please let us know. [facility] phone number is [phone].       Due to recent changes in healthcare laws, you may see results of your pathology and/or laboratory studies on MyChart before the doctors have had a chance to review them. We understand that in some cases there may be results that are confusing or concerning to you. Please understand that not all results are received at the same time and often the doctors may need to interpret multiple results in order to provide you with the best plan of care or course of treatment. Therefore, we ask that you please give [place] 2 business days to thoroughly review all your results before contacting the office for clarification. Should we see a critical lab result, you will be contacted sooner.   If You Need Anything After Your Visit  If you have any questions or concerns for your doctor, please call our main line at [phone] and press option 4 to reach your doctor's medical assistant. If no one answers, please leave a voicemail as directed and we will return your call as soon as possible. Messages left after 4 pm will be answered the following business day.   You may also send [place] a message via [place]. We typically respond to MyChart messages within 1-2 business days.  For prescription refills, please ask your pharmacy to contact our office. Our fax number is [phone].  If you have an urgent issue when the clinic is closed that cannot wait until the next business day, you can page your doctor at the number below.     Please note that while we do our best to be available for urgent issues outside of office hours, we are not available 24/7.   If you have an urgent issue and are unable to reach [place], you may choose to seek medical care at your doctor's office, retail clinic, urgent care center, or emergency room.  If you have a medical emergency, please immediately call 911 or go to the emergency department.  Pager Numbers  - Dr. Nehemiah Massed: [phone]  - Dr. Laurence Ferrari: [phone]  - Dr. Nicole Kindred: [phone]  In the event of inclement weather, please call our main line at [phone] for an update on the status of any delays or closures.  Dermatology Medication Tips: Please keep the boxes that topical medications come in in order to help keep track of the instructions about where and how to use these. Pharmacies typically print the medication instructions only on the boxes and not directly on the medication tubes.   If your medication is too expensive, please contact our office at [phone] option 4 or send [place] a message through [place].   We are unable to tell what your co-pay for medications will be in advance as this is different depending on your insurance coverage. However, we may be able to find a substitute medication at lower cost or fill out paperwork to get insurance to cover a needed medication.   If a prior authorization is required to get your medication covered  by your insurance company, please allow [place] 1-2 business days to complete this process.  Drug prices often vary depending on where the prescription is filled and some pharmacies may offer cheaper prices.  The website www.goodrx.com contains coupons for medications through different pharmacies. The prices here do not account for what the cost may be with help from insurance (it may be cheaper with your insurance), but the website can give you the price if you did not use any insurance.  - You can print the associated coupon and take  it with your prescription to the pharmacy.  - You may also stop by our office during regular business hours and pick up a GoodRx coupon card.  - If you need your prescription sent electronically to a different pharmacy, notify our office through [facility] or by phone at [phone] option 4.     Si Usted Necesita Algo Despus de Su Visita  Tambin puede enviarnos un mensaje a travs de Pharmacist, community. Por lo general respondemos a los mensajes de MyChart en el transcurso de 1 a 2 das hbiles.  Para renovar recetas, por favor pida a su farmacia que se ponga en contacto con nuestra oficina. Harland Dingwall de fax es [place] [phone].  Si tiene un asunto urgente cuando la clnica est cerrada y que no puede esperar hasta el siguiente da hbil, puede llamar/localizar a su doctor(a) al nmero que aparece a continuacin.   Por favor, tenga en cuenta que aunque hacemos todo lo posible para estar disponibles para asuntos urgentes fuera del horario de [place], no estamos disponibles las 24 horas del da, los 7 das de la [place].   Si tiene un problema urgente y no puede comunicarse con nosotros, puede optar por buscar atencin mdica  en el consultorio de su doctor(a), en una clnica privada, en un centro de atencin urgente o en una sala de emergencias.  Si tiene Engineering geologist, por favor llame inmediatamente al 911 o vaya a la sala de emergencias.  Nmeros de bper  - Dr. Nehemiah Massed: [phone]  - Dra. Moye: [phone]  - Dra. Nicole Kindred: [phone]  En caso de inclemencias del [place], por favor llame a Johnsie Kindred principal al [phone] para una actualizacin sobre el [place] de cualquier retraso o cierre.  Consejos para la medicacin en dermatologa: Por favor, guarde las cajas en las que vienen los medicamentos de uso tpico para ayudarle a seguir las instrucciones sobre dnde y cmo usarlos. Las farmacias generalmente imprimen las instrucciones del medicamento slo en las  cajas y no directamente en los tubos del [place].   Si su medicamento es muy caro, por favor, pngase en contacto con Zigmund Daniel llamando al [phone] y presione la opcin 4 o envenos un mensaje a travs de Pharmacist, community.   No podemos decirle cul ser su copago por los medicamentos por adelantado ya que esto es diferente dependiendo de la cobertura de su seguro. Sin embargo, es posible que podamos encontrar un medicamento sustituto a Electrical engineer un formulario para que el seguro cubra el medicamento que se considera necesario.   Si se requiere una autorizacin previa para que su compaa de seguros [place] su medicamento, por favor permtanos de 1 a 2 das hbiles para completar este proceso.  Los precios de los medicamentos varan con frecuencia dependiendo del Environmental consultant de dnde se surte la receta y alguna farmacias pueden ofrecer precios ms baratos.  El sitio web www.goodrx.com tiene cupones para medicamentos de Airline pilot. Los precios aqu no  tienen en cuenta lo que podra costar con la ayuda del seguro (puede ser ms barato con su seguro), pero el sitio web puede darle el precio si no Field seismologist.  - Puede imprimir el cupn correspondiente y llevarlo con su receta a la farmacia.  - Tambin puede pasar por nuestra oficina durante el horario de atencin regular y Charity fundraiser una tarjeta de cupones de GoodRx.  - Si necesita que su receta se enve electrnicamente a una farmacia diferente, informe a nuestra oficina a travs de MyChart de  o por telfono llamando al [phone] y presione la opcin 4.

## 2022-07-22 ENCOUNTER — Other Ambulatory Visit: Payer: Self-pay

## 2022-07-22 DIAGNOSIS — D46Z Other myelodysplastic syndromes: Secondary | ICD-10-CM

## 2022-07-23 ENCOUNTER — Inpatient Hospital Stay: Payer: Medicare Other

## 2022-07-23 ENCOUNTER — Other Ambulatory Visit: Payer: Medicare Other

## 2022-07-23 ENCOUNTER — Inpatient Hospital Stay: Payer: Medicare Other | Attending: Nurse Practitioner

## 2022-07-23 DIAGNOSIS — D46Z Other myelodysplastic syndromes: Secondary | ICD-10-CM | POA: Insufficient documentation

## 2022-07-23 LAB — COMPREHENSIVE METABOLIC PANEL
ALT: 16 U/L (ref 0–44)
AST: 24 U/L (ref 15–41)
Albumin: 3.3 g/dL — ABNORMAL LOW (ref 3.5–5.0)
Alkaline Phosphatase: 68 U/L (ref 38–126)
Anion gap: 10 (ref 5–15)
BUN: 22 mg/dL (ref 8–23)
CO2: 21 mmol/L — ABNORMAL LOW (ref 22–32)
Calcium: 8.1 mg/dL — ABNORMAL LOW (ref 8.9–10.3)
Chloride: 105 mmol/L (ref 98–111)
Creatinine, Ser: 1.43 mg/dL — ABNORMAL HIGH (ref 0.61–1.24)
GFR, Estimated: 48 mL/min — ABNORMAL LOW (ref >60)
Glucose, Bld: 277 mg/dL — ABNORMAL HIGH (ref 70–99)
Potassium: 4 mmol/L (ref 3.5–5.1)
Sodium: 136 mmol/L (ref 135–145)
Total Bilirubin: 0.7 mg/dL (ref 0.3–1.2)
Total Protein: 7.1 g/dL (ref 6.5–8.1)

## 2022-07-23 LAB — CBC WITH DIFFERENTIAL/PLATELET
Abs Immature Granulocytes: 0.01 10*3/uL (ref 0.00–0.07)
Basophils Absolute: 0 10*3/uL (ref 0.0–0.1)
Basophils Relative: 1 %
Eosinophils Absolute: 0.1 10*3/uL (ref 0.0–0.5)
Eosinophils Relative: 3 %
HCT: 25.3 % — ABNORMAL LOW (ref 39.0–52.0)
Hemoglobin: 8 g/dL — ABNORMAL LOW (ref 13.0–17.0)
Immature Granulocytes: 0 %
Lymphocytes Relative: 25 %
Lymphs Abs: 1 10*3/uL (ref 0.7–4.0)
MCH: 31.9 pg (ref 26.0–34.0)
MCHC: 31.6 g/dL (ref 30.0–36.0)
MCV: 100.8 fL — ABNORMAL HIGH (ref 80.0–100.0)
Monocytes Absolute: 0.3 10*3/uL (ref 0.1–1.0)
Monocytes Relative: 6 %
Neutro Abs: 2.6 10*3/uL (ref 1.7–7.7)
Neutrophils Relative %: 65 %
Platelets: 118 10*3/uL — ABNORMAL LOW (ref 150–400)
RBC: 2.51 MIL/uL — ABNORMAL LOW (ref 4.22–5.81)
RDW: 15.9 % — ABNORMAL HIGH (ref 11.5–15.5)
WBC: 4 10*3/uL (ref 4.0–10.5)
nRBC: 0 % (ref 0.0–0.2)

## 2022-07-23 MED ORDER — LUSPATERCEPT-AAMT 75 MG ~~LOC~~ SOLR
100.0000 mg | SUBCUTANEOUS | Status: DC
  Administered 2022-07-23: 100 mg via SUBCUTANEOUS
  Filled 2022-07-23: qty 0.5

## 2022-08-03 ENCOUNTER — Encounter: Payer: Self-pay | Admitting: Dermatology

## 2022-08-06 ENCOUNTER — Encounter: Payer: Self-pay | Admitting: Hematology and Oncology

## 2022-08-06 DIAGNOSIS — D709 Neutropenia, unspecified: Secondary | ICD-10-CM | POA: Insufficient documentation

## 2022-08-06 DIAGNOSIS — R011 Cardiac murmur, unspecified: Secondary | ICD-10-CM | POA: Insufficient documentation

## 2022-08-08 ENCOUNTER — Ambulatory Visit: Payer: Medicare Other | Admitting: Podiatry

## 2022-08-15 ENCOUNTER — Ambulatory Visit (INDEPENDENT_AMBULATORY_CARE_PROVIDER_SITE_OTHER): Payer: Medicare Other | Admitting: Podiatry

## 2022-08-15 DIAGNOSIS — E1122 Type 2 diabetes mellitus with diabetic chronic kidney disease: Secondary | ICD-10-CM

## 2022-08-15 DIAGNOSIS — B351 Tinea unguium: Secondary | ICD-10-CM

## 2022-08-15 DIAGNOSIS — M79675 Pain in left toe(s): Secondary | ICD-10-CM | POA: Diagnosis not present

## 2022-08-15 DIAGNOSIS — N183 Chronic kidney disease, stage 3 unspecified: Secondary | ICD-10-CM | POA: Diagnosis not present

## 2022-08-15 DIAGNOSIS — Z794 Long term (current) use of insulin: Secondary | ICD-10-CM

## 2022-08-15 DIAGNOSIS — M79674 Pain in right toe(s): Secondary | ICD-10-CM

## 2022-08-15 NOTE — Progress Notes (Signed)
  Subjective:  Patient ID: Alex Smith, male    DOB: 07-Nov-1934,  MRN: [id number]  Chief Complaint  Patient presents with   foot care    Patient is here  for diabetic foot care.   87 y.o. male returns for the above complaint.  Patient presents with thickened elongated dystrophic toenails x 10 mild pain on palpation hurts with ambulation hurts with pressure he like for me to be down is not able to do it himself.  Objective:  There were no vitals filed for this visit. Podiatric Exam: Vascular: dorsalis pedis and posterior tibial pulses are palpable bilateral. Capillary return is immediate. Temperature gradient is WNL. Skin turgor WNL  Sensorium: Normal Semmes Weinstein monofilament test. Normal tactile sensation bilaterally. Nail Exam: Pt has thick disfigured discolored nails with subungual debris noted bilateral entire nail hallux through fifth toenails.  Pain on palpation to the nails. Ulcer Exam: There is no evidence of ulcer or pre-ulcerative changes or infection. Orthopedic Exam: Muscle tone and strength are WNL. No limitations in general ROM. No crepitus or effusions noted.  Skin: No Porokeratosis. No infection or ulcers    Assessment & Plan:   1. Pain due to onychomycosis of toenails of both feet   2. Type 2 diabetes mellitus with stage 3 chronic kidney disease, with long-term current use of insulin, unspecified whether stage 3a or 3b CKD ([place])     Patient was evaluated and treated and all questions answered.  Onychomycosis with pain  -Nails palliatively debrided as below. -Educated on self-care  Procedure: Nail Debridement Rationale: pain  Type of Debridement: manual, sharp debridement. Instrumentation: Nail nipper, rotary burr. Number of Nails: 10  Procedures and Treatment: Consent by patient was obtained for treatment procedures. The patient understood the discussion of treatment and procedures well. All questions were answered thoroughly reviewed. Debridement of  mycotic and hypertrophic toenails, 1 through 5 bilateral and clearing of subungual debris. No ulceration, no infection noted.  Return Visit-Office Procedure: Patient instructed to return to the office for a follow up visit 3 months for continued evaluation and treatment.  Boneta Lucks, DPM    No follow-ups on file.

## 2022-08-20 ENCOUNTER — Encounter: Payer: Self-pay | Admitting: Hematology and Oncology

## 2022-08-25 ENCOUNTER — Emergency Department: Payer: PPO

## 2022-08-25 ENCOUNTER — Encounter: Payer: Self-pay | Admitting: Hematology and Oncology

## 2022-08-25 ENCOUNTER — Other Ambulatory Visit: Payer: Self-pay

## 2022-08-25 ENCOUNTER — Emergency Department
Admission: EM | Admit: 2022-08-25 | Discharge: 2022-08-25 | Disposition: A | Discharge status: Home / Self Care | Payer: PPO | Attending: Emergency Medicine | Admitting: Emergency Medicine

## 2022-08-25 DIAGNOSIS — G309 Alzheimer's disease, unspecified: Secondary | ICD-10-CM | POA: Diagnosis not present

## 2022-08-25 DIAGNOSIS — W19XXXA Unspecified fall, initial encounter: Secondary | ICD-10-CM | POA: Insufficient documentation

## 2022-08-25 DIAGNOSIS — S0990XA Unspecified injury of head, initial encounter: Secondary | ICD-10-CM | POA: Diagnosis present

## 2022-08-25 DIAGNOSIS — N183 Chronic kidney disease, stage 3 unspecified: Secondary | ICD-10-CM | POA: Diagnosis not present

## 2022-08-25 DIAGNOSIS — E1122 Type 2 diabetes mellitus with diabetic chronic kidney disease: Secondary | ICD-10-CM | POA: Diagnosis not present

## 2022-08-25 DIAGNOSIS — I129 Hypertensive chronic kidney disease with stage 1 through stage 4 chronic kidney disease, or unspecified chronic kidney disease: Secondary | ICD-10-CM | POA: Diagnosis not present

## 2022-08-25 LAB — BASIC METABOLIC PANEL
Anion gap: 10 (ref 5–15)
BUN: 24 mg/dL — ABNORMAL HIGH (ref 8–23)
CO2: 19 mmol/L — ABNORMAL LOW (ref 22–32)
Calcium: 8.4 mg/dL — ABNORMAL LOW (ref 8.9–10.3)
Chloride: 107 mmol/L (ref 98–111)
Creatinine, Ser: 1.4 mg/dL — ABNORMAL HIGH (ref 0.61–1.24)
GFR, Estimated: 49 mL/min — ABNORMAL LOW (ref >60)
Glucose, Bld: 236 mg/dL — ABNORMAL HIGH (ref 70–99)
Potassium: 3.8 mmol/L (ref 3.5–5.1)
Sodium: 136 mmol/L (ref 135–145)

## 2022-08-25 LAB — CBC
HCT: 28.2 % — ABNORMAL LOW (ref 39.0–52.0)
Hemoglobin: 8.7 g/dL — ABNORMAL LOW (ref 13.0–17.0)
MCH: 32 pg (ref 26.0–34.0)
MCHC: 30.9 g/dL (ref 30.0–36.0)
MCV: 103.7 fL — ABNORMAL HIGH (ref 80.0–100.0)
Platelets: 117 10*3/uL — ABNORMAL LOW (ref 150–400)
RBC: 2.72 MIL/uL — ABNORMAL LOW (ref 4.22–5.81)
RDW: 17.3 % — ABNORMAL HIGH (ref 11.5–15.5)
WBC: 3.7 10*3/uL — ABNORMAL LOW (ref 4.0–10.5)
nRBC: 0 % (ref 0.0–0.2)

## 2022-08-25 NOTE — ED Provider Notes (Signed)
[facility] Provider Note    Event Date/Time   First MD Initiated Contact with Patient 08/25/22 2154     (approximate)   History   Chief Complaint Fall   HPI Alex Smith is a 88 y.o. male, history of type 2 diabetes, hypertension, GERD, dyslipidemia, stage III CKD, Alzheimer's dementia, presents to the emergency department for evaluation of head injury secondary to mechanical fall.  Patient is here with his son.  He states that he was try to get into bed when he fell backwards and hit his head on the nightstand.  Denies any preceding symptoms.  Denies loss of consciousness.  He states that he had some dizziness at first following the injury, but is otherwise asymptomatic at this time.  He states that he is just here to make sure his head is okay.  He did sustain some abrasions/skin tears on his right forearm and right lower leg.  Denies vision change, hearing changes, chest pain, shortness of breath, abdominal pain, flank pain, nausea/vomit, positive bladder dysfunction, paresthesias, lightheadedness, weakness, or fever/chills.  Denies blood thinner usage.  History Limitations: No limitations.        Physical Exam  Triage Vital Signs: ED Triage Vitals  Enc Vitals Group     BP 08/25/22 2106 (!) 169/78     Pulse Rate 08/25/22 2106 80     Resp 08/25/22 2100 16     Temp 08/25/22 2100 98 F (36.7 C)     Temp Source 08/25/22 2100 Oral     SpO2 08/25/22 2100 96 %     Weight 08/25/22 2102 200 lb (90.7 kg)     Height 08/25/22 2102 '5\' 7"'$  (1.702 m)     Head Circumference --      Peak Flow --      Pain Score 08/25/22 2102 0     Pain Loc --      Pain Edu? --      Excl. in [place]? --     Most recent vital signs: Vitals:   08/25/22 2106 08/25/22 2259  BP: (!) 169/78 (!) 183/82  Pulse: 80 76  Resp: 16 18  Temp:    SpO2:  95%    General: Awake, NAD.  Skin: Warm, dry. No rashes or lesions.  Eyes: PERRL. Conjunctivae normal.  CV: Good peripheral  perfusion.  Resp: Normal effort.  Abd: Soft, non-tender. No distention.  Neuro: At baseline. No gross neurological deficits.  Musculoskeletal: Normal ROM of all extremities.  Focused Exam: Multiple mild, superficial skin tears across the right forearm and right lower extremity.  No significant lacerations.  Small abrasion present along the occiput of the head.  No crepitus.  No midline spinal tenderness.  Physical Exam    ED Results / Procedures / Treatments  Labs (all labs ordered are listed, but only abnormal results are displayed) Labs Reviewed  BASIC METABOLIC PANEL - Abnormal; Notable for the following components:      Result Value   CO2 19 (*)    Glucose, Bld 236 (*)    BUN 24 (*)    Creatinine, Ser 1.40 (*)    Calcium 8.4 (*)    GFR, Estimated 49 (*)    All other components within normal limits  CBC - Abnormal; Notable for the following components:   WBC 3.7 (*)    RBC 2.72 (*)    Hemoglobin 8.7 (*)    HCT 28.2 (*)    MCV 103.7 (*)  RDW 17.3 (*)    Platelets 117 (*)    All other components within normal limits     EKG Sinus rhythm, rate of 82, first-degree AV block present, intermittent PVCs, no significant ST segment changes, normal QRS, no QT prolongation.    RADIOLOGY  ED Provider Interpretation: I personally reviewed and interpreted the CT scan, no evidence of acute intracranial abnormalities.  No results found.  PROCEDURES:  Critical Care performed: N/A.  Procedures    MEDICATIONS ORDERED IN ED: Medications - No data to display   IMPRESSION / MDM / [place] / ED COURSE  I reviewed the triage vital signs and the nursing notes.                              Differential diagnosis includes, but is not limited to, epidural/subdural hematoma, concussion, cervical fracture, cervical sprain, skin tears/abrasions, arrhythmias.  ED Course Patient appears well, vitals within normal limits.  NAD.  CBC shows anemia with hemoglobin of  8.7, consistent with baseline.  No leukocytosis.  BMP shows elevated creatinine 1.4, consistent with baseline.  Elevated glucose at 236, consistent with previous values.  No significant electrolyte abnormalities.   Assessment/Plan Patient presents with head injury following mechanical fall.  He appears well clinically.  Mild skin tears appreciated on the right upper extremity and right lower extremity, as well has an abrasion on the back of the head, otherwise unremarkable.  Lab workup does not show any remarkable findings outside of his baseline.  His CT fortunately does not show any evidence of any acute intracranial abnormalities.  Although, did advise him that he may have sustained a concussion.  Provided with education material regarding this and encouraged him to follow-up with his primary care provider as needed.  Advised him to take Tylenol as needed for his pain.  Provide nonstick dressing with sterile gauze for the skin tears on his extremities.  Will discharge.  Provided the patient with anticipatory guidance, return precautions, and educational material. Encouraged the patient to return to the emergency department at any time if they begin to experience any new or worsening symptoms. Patient expressed understanding and agreed with the plan.   Patient's presentation is most consistent with acute complicated illness / injury requiring diagnostic workup.       FINAL CLINICAL IMPRESSION(S) / ED DIAGNOSES   Final diagnoses:  Fall, initial encounter  Injury of head, initial encounter     Rx / DC Orders   ED Discharge Orders     None        Note:  This document was prepared using Dragon voice recognition software and may include unintentional dictation errors.   Teodoro Spray, [place] 08/25/22 2346    Carrie Mew, MD 08/26/22 [phone]

## 2022-08-25 NOTE — Discharge Instructions (Addendum)
-  CT scan not show any signs of intracranial hemorrhage.  However, is possible that he may have sustained a concussion.  Please review the educational tool provided.  Avoid activities that may exacerbate your symptoms.  -You may take Tylenol/ibuprofen as needed.  -Recommend applying antibiotic ointment/Neosporin to the skin tears daily and covering with a Band-Aid or dressing.  -Return to the emergency department anytime if you begin to experience any new or worsening symptoms.

## 2022-08-25 NOTE — ED Triage Notes (Signed)
First nurse note:  BIB [facility] from the [facility]. Pt c/o fall after attempting to put feet up on chair while in bed and slid out of bed. Skin tears to R arm and leg and abrasion to top of head. No LOC. No daily thinners. Pt alert and oriented x 4.   181/71 HR 78 95% RA BGL 267- facility reported this is pt normal.

## 2022-08-25 NOTE — ED Triage Notes (Signed)
Pt to ED for fall. Pt here with his son. Pt was trying to get into bed and fell out. Pt his the back of his head on the nightstand. Pt denies LOC and remembers all of the events leading up to and the fall. Pt is CAOx4 and can answer all questions appropriately. Pt advised he is here to make sure his head is ok. He has not other complaints./

## 2022-08-27 ENCOUNTER — Other Ambulatory Visit: Payer: Medicare Other

## 2022-08-27 ENCOUNTER — Inpatient Hospital Stay: Payer: PPO | Attending: Nurse Practitioner

## 2022-08-27 ENCOUNTER — Inpatient Hospital Stay: Payer: PPO

## 2022-08-27 ENCOUNTER — Inpatient Hospital Stay (HOSPITAL_BASED_OUTPATIENT_CLINIC_OR_DEPARTMENT_OTHER): Payer: PPO | Admitting: Oncology

## 2022-08-27 ENCOUNTER — Encounter: Payer: Self-pay | Admitting: Oncology

## 2022-08-27 VITALS — BP 161/88 | HR 91 | Temp 98.0°F | Wt 202.6 lb

## 2022-08-27 DIAGNOSIS — D46Z Other myelodysplastic syndromes: Secondary | ICD-10-CM | POA: Insufficient documentation

## 2022-08-27 DIAGNOSIS — C9000 Multiple myeloma not having achieved remission: Secondary | ICD-10-CM | POA: Insufficient documentation

## 2022-08-27 DIAGNOSIS — D472 Monoclonal gammopathy: Secondary | ICD-10-CM

## 2022-08-27 DIAGNOSIS — Z79899 Other long term (current) drug therapy: Secondary | ICD-10-CM

## 2022-08-27 LAB — CBC WITH DIFFERENTIAL/PLATELET
Abs Immature Granulocytes: 0.02 10*3/uL (ref 0.00–0.07)
Basophils Absolute: 0 10*3/uL (ref 0.0–0.1)
Basophils Relative: 1 %
Eosinophils Absolute: 0.1 10*3/uL (ref 0.0–0.5)
Eosinophils Relative: 4 %
HCT: 27.6 % — ABNORMAL LOW (ref 39.0–52.0)
Hemoglobin: 8.8 g/dL — ABNORMAL LOW (ref 13.0–17.0)
Immature Granulocytes: 1 %
Lymphocytes Relative: 23 %
Lymphs Abs: 0.9 10*3/uL (ref 0.7–4.0)
MCH: 32.1 pg (ref 26.0–34.0)
MCHC: 31.9 g/dL (ref 30.0–36.0)
MCV: 100.7 fL — ABNORMAL HIGH (ref 80.0–100.0)
Monocytes Absolute: 0.2 10*3/uL (ref 0.1–1.0)
Monocytes Relative: 5 %
Neutro Abs: 2.6 10*3/uL (ref 1.7–7.7)
Neutrophils Relative %: 66 %
Platelets: 111 10*3/uL — ABNORMAL LOW (ref 150–400)
RBC: 2.74 MIL/uL — ABNORMAL LOW (ref 4.22–5.81)
RDW: 17.3 % — ABNORMAL HIGH (ref 11.5–15.5)
WBC: 3.9 10*3/uL — ABNORMAL LOW (ref 4.0–10.5)
nRBC: 0 % (ref 0.0–0.2)

## 2022-08-27 LAB — COMPREHENSIVE METABOLIC PANEL
ALT: 10 U/L (ref 0–44)
AST: 18 U/L (ref 15–41)
Albumin: 3.6 g/dL (ref 3.5–5.0)
Alkaline Phosphatase: 81 U/L (ref 38–126)
Anion gap: 7 (ref 5–15)
BUN: 22 mg/dL (ref 8–23)
CO2: 23 mmol/L (ref 22–32)
Calcium: 8.2 mg/dL — ABNORMAL LOW (ref 8.9–10.3)
Chloride: 107 mmol/L (ref 98–111)
Creatinine, Ser: 1.45 mg/dL — ABNORMAL HIGH (ref 0.61–1.24)
GFR, Estimated: 47 mL/min — ABNORMAL LOW (ref >60)
Glucose, Bld: 265 mg/dL — ABNORMAL HIGH (ref 70–99)
Potassium: 3.8 mmol/L (ref 3.5–5.1)
Sodium: 137 mmol/L (ref 135–145)
Total Bilirubin: 0.8 mg/dL (ref 0.3–1.2)
Total Protein: 6.9 g/dL (ref 6.5–8.1)

## 2022-08-27 MED ORDER — LUSPATERCEPT-AAMT 75 MG ~~LOC~~ SOLR
100.0000 mg | SUBCUTANEOUS | Status: AC
  Administered 2022-08-27: 100 mg via SUBCUTANEOUS
  Filled 2022-08-27: qty 1.5

## 2022-08-27 NOTE — Progress Notes (Signed)
Hematology/Oncology Consult note [facility]  [phone] [phone]  Patient Care Team: [facility], [facility] as PCP - General (Geriatric Medicine) Crissie Sickles, MD as Referring Physician (Hematology and Oncology) Crissie Sickles, MD as Referring Physician (Hematology and Oncology)   Name of the patient: Alex Smith  [id number]  [birth year]   Date of visit: 08/27/22  Diagnosis- History of hairy cell leukemia 2.  Smoldering multiple myeloma under observation 3.  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 [name] and is transitioning his care to me.  He has following issues:   1.  Hairy cell leukemia that was initially diagnosed on bone marrow biopsy in June 2016.  Bone marrow biopsy at that time showed extensive marrow involvement with recurrent hairy cell leukemia approximately 80% of the cells in the core 10% monoclonal plasma cell infiltrate compatible with plasma cell neoplasm.  Peripheral smear revealed leukopenia with a white count of 1900 compatible with hairy cell leukemia. 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  FISH studies for MDS were negative.  Cytogenetics were normal (46, XY).  He has last received cladribine for this back in July 2016   2.  Smoldering multiple myeloma: Bone marrow biopsy in March 2017 revealed persistent plasma cell neoplasm with monoclonal plasma cells estimated at 20 to 30%.  No morphologic evidence of residual hairy cell leukemia.Marrow was normocellular for age with relative erythroid hyperplasia, relative myeloid hypoplasia, mild dyspoiesis, adequate megakaryocytes and no increased blasts.  There was no significant increase in marrow reticulin fibers.  Iron was present.   FISH studies for MDS were negative.  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  Cytogenetics were normal (46, XY).  Last bone marrow biopsy was in October 2018 which showed variably cellular marrow 10 to 50% with kappa restricted plasmacytosis of 10 to 15%.  No evidence of leukemia lymphoma or high-grade dysplasia.  Normal cytogenetics.  He has also been followed by Dr. [name] at [facility].  He has received Revlimid and Decadron in the past in 2017 which was subsequently stopped.  PET scan in July 2017 showed no hypermetabolic adenopathy or focal skeletal lesions.   3.  MDS: He has received a trial of Procrit in the past in 2017 but apparently did not respond to it.  He has been getting periodic blood transfusions to maintain his hemoglobin closer to 9 as he is symptomatic when his hemoglobin is less than 9.  He has been getting about 2-3 blood transfusions during the year.  He does have some mild leukopenia/neutropenia and is on acyclovir prophylaxis.  He was getting weekly G-CSF when his ANC is less than 1.5.  He is not presently on G-CSF.  He is currently on Luspatercept for his anemia.   4.  Also has history of B12 deficiency for which he is on oral B12.      Interval history-patient was in the ER 2 days ago after a fall.  No major injuries.  Patient and family have noticed an enlarged cervical lymph node which has been present for the last couple of months.  ECOG PS- 2-3 Pain scale- 0   Review of systems- Review of Systems  Constitutional:  Positive for malaise/fatigue. Negative for chills, fever  and weight loss.  HENT:  Negative for congestion, ear discharge and nosebleeds.   Eyes:  Negative for blurred vision.  Respiratory:  Negative for cough, hemoptysis, sputum production, shortness of breath and wheezing.   Cardiovascular:  Negative for chest pain, palpitations, orthopnea and claudication.  Gastrointestinal:  Negative for abdominal pain,  blood in stool, constipation, diarrhea, heartburn, melena, nausea and vomiting.  Genitourinary:  Negative for dysuria, flank pain, frequency, hematuria and urgency.  Musculoskeletal:  Negative for back pain, joint pain and myalgias.  Skin:  Negative for rash.  Neurological:  Negative for dizziness, tingling, focal weakness, seizures, weakness and headaches.  Endo/Heme/Allergies:  Does not bruise/bleed easily.  Psychiatric/Behavioral:  Negative for depression and suicidal ideas. The patient does not have insomnia.       Allergies  Allergen Reactions   Rituximab Rash    Chest tightness Chest tightness   Blood-Group Specific Substance Other (See Comments)    Had a post transfusion reaction of red blood cells; NOW REQUIRES WASHED BLOOD CELLS Had a post transfusion reaction of red blood cells; NOW REQUIRES WASHED BLOOD CELLS   Primaxin [Imipenem] Other (See Comments)    Possible allergy Tolerates cephalosporins   Voriconazole Other (See Comments)   Sulfa Antibiotics Itching and Rash   Sulfacetamide Sodium Itching and Rash     Past Medical History:  Diagnosis Date   Alzheimer's dementia ([place]) 08/18/2021   Anemia    B12 deficiency    Basal cell carcinoma 03/30/2018   left ant nasal ala   Basal cell carcinoma of face 01/19/2013   right distal dorsum nose   BPH (benign prostatic hypertrophy)    followed by urology, discharged (Dr. Bernardo Heater)   CAP (community acquired pneumonia) 02/15/2015   CKD stage 3 due to type 2 diabetes mellitus ([place]) 11/14/2017   Colon polyps    Diverticulosis    Dysplastic nevus 10/27/2006   right med calf   GERD (gastroesophageal reflux disease)    Hairy cell leukemia ([place]) 2006   recurrent, seizure on rituxan, now on cladribine [name])   History of pneumonia 2000's   "once" (07/07/2012)   History of shingles    HLD (hyperlipidemia)    Hypertension    Pneumonia    Shortness of breath dyspnea    Squamous cell carcinoma in situ 07/05/2020   Left  lat. pretibial. 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 EDC 09/07/2020   Squamous cell carcinoma of skin 04/03/2020   Left cheek. WD SCC   Systolic murmur 46/50/3546   Type 2 diabetes, controlled, with retinopathy ([place])      Past Surgical History:  Procedure Laterality Date   25 GAUGE PARS PLANA VITRECTOMY WITH 20 GAUGE MVR PORT FOR MACULAR HOLE  07/07/2012   Procedure: 25 GAUGE PARS PLANA VITRECTOMY WITH 20 GAUGE MVR PORT FOR MACULAR HOLE;  Surgeon: Hayden Pedro, MD;  Location: [place];  Service: Ophthalmology;  Laterality: Left;   BONE MARROW BIOPSY  2016   CARDIOVASCULAR STRESS TEST  2013   treadmill - no evidence ischemia, EF 61%   CATARACT EXTRACTION W/ INTRAOCULAR LENS  IMPLANT, BILATERAL  ~ 2010   COLONOSCOPY  2014   [place] WNL no rpt needed, h/o polyps   EYE SURGERY Left 06/2012   laser surgery   GAS INSERTION  07/07/2012   Procedure: INSERTION OF GAS;  Surgeon: Hayden Pedro, MD;  Location: [place];  Service: Ophthalmology;  Laterality: Left;  [id number]  PERIPHERAL VASCULAR CATHETERIZATION N/A 02/23/2015   Procedure: Glori Luis Cath Insertion;  Surgeon: Algernon Huxley, MD;  Location: [place] [facility];  Service: Cardiovascular;  Laterality: N/A;   PORTA CATH REMOVAL N/A 02/16/2020   Procedure: PORTA CATH REMOVAL;  Surgeon: Algernon Huxley, MD;  Location: [place] [facility];  Service: Cardiovascular;  Laterality: N/A;   SERUM PATCH  07/07/2012   Procedure: SERUM PATCH;  Surgeon: Hayden Pedro, MD;  Location: [place];  Service: Ophthalmology;  Laterality: Left;   SKIN CANCER EXCISION     "all over my face" (07/07/2012)    Social History   Socioeconomic History   Marital status: Married    Spouse name: Not on file   Number of children: Not on file   Years of education: Not on file   Highest education level: Not on file  Occupational History   Not on file  Tobacco Use   Smoking status: Former    Packs/day: 2.00    Years: 25.00     Total pack years: 50.00    Types: Cigarettes    Quit date: 02/06/1969    Years since quitting: 53.5   Smokeless tobacco: Never   Tobacco comments:    07/07/2012 "stopped smoking ~ 40 yr ago; smoked 20-36yr  Vaping Use   Vaping Use: Never used  Substance and Sexual Activity   Alcohol use: Not Currently   Drug use: No   Sexual activity: Never  Other Topics Concern   Not on file  Social History Narrative   Lives at home alone. 1 cat   Occupation: was cArt gallery manager works part time for [employer]   Activity: likes to golf   Diet: moderate water, fruits/vegetables daily   Social Determinants of HRadio broadcast assistantStrain: Not on file  Food Insecurity: Not on file  Transportation Needs: Not on file  Physical Activity: Not on file  Stress: Not on file  Social Connections: Not on file  Intimate Partner Violence: Not on file    Family History  Problem Relation Age of Onset   Dementia Mother    Heart failure Father 734  Cancer Sister        breast   Diabetes Paternal Uncle    Diabetes Paternal Aunt    CAD Brother 449      MI   Stroke Neg Hx      Current Outpatient Medications:    albuterol (VENTOLIN HFA) 108 (90 Base) MCG/ACT inhaler, Inhale 2 puffs into the lungs every 6 (six) hours as needed for wheezing or shortness of breath., Disp: , Rfl:    alum & mag hydroxide-simeth (MAALOX/MYLANTA) 200-200-20 MG/5ML suspension, Take 30 mLs by mouth every 4 (four) hours as needed for indigestion or heartburn., Disp: 355 mL, Rfl: 0   amLODipine (NORVASC) 10 MG tablet, Take 1 tablet (10 mg total) by mouth daily., Disp: , Rfl:    aspirin EC 81 MG tablet, Take 81 mg by mouth daily., Disp: , Rfl:    famotidine (PEPCID) 20 MG tablet, Take 20 mg by mouth daily., Disp: , Rfl:    insulin glargine (LANTUS) 100 UNIT/ML Solostar Pen, Inject 15 Units into the skin at bedtime., Disp: , Rfl:    melatonin 3 MG TABS tablet, Take 3 mg by mouth at bedtime., Disp: , Rfl:    Multiple Vitamin  (MULTIVITAMIN WITH MINERALS) TABS tablet, Take 1 tablet by mouth daily., Disp: , Rfl:    pantoprazole (PROTONIX) 40 MG tablet,  Take 1 tablet (40 mg total) by mouth 2 (two) times daily., Disp: , Rfl:    pravastatin (PRAVACHOL) 20 MG tablet, TAKE ONE TABLET EVERY EVENING, Disp: 30 tablet, Rfl: 0   sertraline (ZOLOFT) 25 MG tablet, Take 25 mg by mouth daily., Disp: , Rfl:    sitaGLIPtin (JANUVIA) 50 MG tablet, Take 50 mg by mouth daily., Disp: , Rfl:    tamsulosin (FLOMAX) 0.4 MG CAPS capsule, TAKE 1 CAPSULE BY MOUTH EVERY DAY, Disp: 30 capsule, Rfl: 3   vitamin B-12 (CYANOCOBALAMIN) 1000 MCG tablet, Take 1 tablet (1,000 mcg total) by mouth daily., Disp: 90 tablet, Rfl: 3   vitamin E 400 UNIT capsule, Take 400 Units by mouth daily., Disp: , Rfl:    doxazosin (CARDURA) 1 MG tablet, TAKE ONE TABLET BY MOUTH EVERY DAY (Patient not taking: Reported on 08/27/2022), Disp: 30 tablet, Rfl: 5   feeding supplement (ENSURE ENLIVE / ENSURE PLUS) LIQD, Take 237 mLs by mouth 2 (two) times daily between meals. (Patient not taking: Reported on 08/27/2022), Disp: 237 mL, Rfl: 12 No current facility-administered medications for this visit.  Facility-Administered Medications Ordered in Other Visits:    heparin lock flush 100 unit/mL, 500 Units, Intravenous, Once, Corcoran, Melissa C, MD   luspatercept-aamt (REBLOZYL) subcutaneous injection 100 mg, 100 mg, Subcutaneous, Q21 days, Sindy Guadeloupe, MD, 100 mg at 08/27/22 1206   sodium chloride 0.9 % injection 10 mL, 10 mL, Intravenous, PRN, [name], Melissa C, MD, 10 mL at 03/03/15 0903   sodium chloride 0.9 % injection 10 mL, 10 mL, Intracatheter, PRN, [name], Melissa C, MD, 10 mL at 03/10/15 1410   sodium chloride flush (NS) 0.9 % injection 10 mL, 10 mL, Intravenous, PRN, [name], Melissa C, MD, 10 mL at 07/23/18 1517  Physical exam:  Vitals:   08/27/22 1025  BP: (!) 161/88  Pulse: 91  Temp: 98 F (36.7 C)  TempSrc: Tympanic  Weight: 202 lb 9.6 oz (91.9 kg)    Physical Exam Constitutional:      Comments: Frail elderly gentleman who ambulates with a walker  Cardiovascular:     Rate and Rhythm: Normal rate and regular rhythm.     Heart sounds: Normal heart sounds.  Pulmonary:     Effort: Pulmonary effort is normal.     Breath sounds: Normal breath sounds.  Abdominal:     General: Bowel sounds are normal.     Palpations: Abdomen is soft.  Lymphadenopathy:     Comments: There is a palpable 2 cm lymph node in the submandibular area.  No palpable bilateral axillary or supraclavicular or inguinal adenopathy  Skin:    General: Skin is warm and dry.  Neurological:     Mental Status: He is alert and oriented to person, place, and time.         Latest Ref Rng & Units 08/27/2022    9:48 AM  CMP  Glucose 70 - 99 mg/dL 265   BUN 8 - 23 mg/dL 22   Creatinine 0.61 - 1.24 mg/dL 1.45   Sodium 135 - 145 mmol/L 137   Potassium 3.5 - 5.1 mmol/L 3.8   Chloride 98 - 111 mmol/L 107   CO2 22 - 32 mmol/L 23   Calcium 8.9 - 10.3 mg/dL 8.2   Total Protein 6.5 - 8.1 g/dL 6.9   Total Bilirubin 0.3 - 1.2 mg/dL 0.8   Alkaline Phos 38 - 126 U/L 81   AST 15 - 41 U/L 18   ALT 0 -  44 U/L 10       Latest Ref Rng & Units 08/27/2022    9:48 AM  CBC  WBC 4.0 - 10.5 K/uL 3.9   Hemoglobin 13.0 - 17.0 g/dL 8.8   Hematocrit 39.0 - 52.0 % 27.6   Platelets 150 - 400 K/uL 111     No images are attached to the encounter.  CT Head Wo Contrast  Result Date: 08/25/2022 CLINICAL DATA:  Head trauma, minor (Age >= 65y) EXAM: CT HEAD WITHOUT CONTRAST TECHNIQUE: Contiguous axial images were obtained from the base of the skull through the vertex without intravenous contrast. RADIATION DOSE REDUCTION: This exam was performed according to the departmental dose-optimization program which includes automated exposure control, adjustment of the mA and/or kV according to patient size and/or use of iterative reconstruction technique. COMPARISON:  CT head 04/09/2022 FINDINGS: Brain:  Cerebral ventricle sizes are concordant with the degree of cerebral volume loss. Patchy and confluent areas of decreased attenuation are noted throughout the deep and periventricular white matter of the cerebral hemispheres bilaterally, compatible with chronic microvascular ischemic disease. no evidence of large-territorial acute infarction. No parenchymal hemorrhage. No mass lesion. No extra-axial collection. No mass effect or midline shift. No hydrocephalus. Basilar cisterns are patent. Vascular: No hyperdense vessel. Atherosclerotic calcifications are present within the cavernous internal carotid and vertebral arteries. Skull: No acute fracture or focal lesion. Sinuses/Orbits: Partial right mastoid effusion. Paranasal sinuses and left mastoid air cells are clear. Bilateral lens replacement. Otherwise the orbits are unremarkable. Other: None. IMPRESSION: No acute intracranial abnormality. Electronically Signed   By: Iven Finn M.D.   On: 08/25/2022 22:08     Assessment and plan- Patient is a 88 y.o. male who is here for follow-up of following issues  History of MDS: Presently on Luspatercept every 5 weeks.  Over the last 23-monthhis patient's hemoglobin has gradually drifted down and is now between 8-8.8.  I will therefore go back to Luspatercept every 3-week dosing instead of every 5-week dosing.  If his hemoglobin goes back up to the tens I will consider stretching it out further.  Smoldering multiple myeloma: Free light chain ratio has remained stableM protein has also remained stable around 1 to 1.2 g.  I therefore do not think that his anemia is because of progression of smoldering multiple myeloma to overt multiple myeloma.  Patient will continue Luspatercept every 3 weeks and I will see him back in 12 weeks with CBC ferritin and iron studies B12 folate kappa lambda light chains and myeloma panel.  Palpable right submandibular lymph node: Possibly reactive secondary to recent URIs.  I will  follow-up on this node in 3 months time.  If there is any further enlargement of this note I will consider doing a biopsy at that time.  Patient does not have any other areas of palpable adenopathy   Visit Diagnosis 1. Myelodysplasia present in bone marrow ([place]   2. High risk medication use   3. Smoldering multiple myeloma      Dr. ARanda Evens MD, MPH [id number]/04/2023 6:35 PM

## 2022-08-28 LAB — KAPPA/LAMBDA LIGHT CHAINS
Kappa free light chain: 74.6 mg/L — ABNORMAL HIGH (ref 3.3–19.4)
Kappa, lambda light chain ratio: 5.01 — ABNORMAL HIGH (ref 0.26–1.65)
Lambda free light chains: 14.9 mg/L (ref 5.7–26.3)

## 2022-08-30 LAB — MULTIPLE MYELOMA PANEL, SERUM
Albumin SerPl Elph-Mcnc: 3.6 g/dL (ref 2.9–4.4)
Albumin/Glob SerPl: 1.2 (ref 0.7–1.7)
Alpha 1: 0.2 g/dL (ref 0.0–0.4)
Alpha2 Glob SerPl Elph-Mcnc: 0.6 g/dL (ref 0.4–1.0)
B-Globulin SerPl Elph-Mcnc: 0.7 g/dL (ref 0.7–1.3)
Gamma Glob SerPl Elph-Mcnc: 1.7 g/dL (ref 0.4–1.8)
Globulin, Total: 3.2 g/dL (ref 2.2–3.9)
IgA: 19 mg/dL — ABNORMAL LOW (ref 61–437)
IgG (Immunoglobin G), Serum: 1903 mg/dL — ABNORMAL HIGH (ref 603–1613)
IgM (Immunoglobulin M), Srm: 36 mg/dL (ref 15–143)
M Protein SerPl Elph-Mcnc: 1.4 g/dL — ABNORMAL HIGH
Total Protein ELP: 6.8 g/dL (ref 6.0–8.5)

## 2022-09-17 ENCOUNTER — Inpatient Hospital Stay: Payer: PPO

## 2022-09-17 VITALS — BP 168/81 | Wt 201.0 lb

## 2022-09-17 DIAGNOSIS — R2689 Other abnormalities of gait and mobility: Secondary | ICD-10-CM | POA: Insufficient documentation

## 2022-09-17 DIAGNOSIS — C9000 Multiple myeloma not having achieved remission: Secondary | ICD-10-CM | POA: Diagnosis not present

## 2022-09-17 DIAGNOSIS — M6281 Muscle weakness (generalized): Secondary | ICD-10-CM | POA: Insufficient documentation

## 2022-09-17 DIAGNOSIS — D46Z Other myelodysplastic syndromes: Secondary | ICD-10-CM

## 2022-09-17 MED ORDER — LUSPATERCEPT-AAMT 75 MG ~~LOC~~ SOLR
100.0000 mg | SUBCUTANEOUS | Status: DC
  Administered 2022-09-17: 100 mg via SUBCUTANEOUS
  Filled 2022-09-17: qty 0.5

## 2022-10-01 ENCOUNTER — Inpatient Hospital Stay: Payer: Medicare Other

## 2022-10-01 ENCOUNTER — Other Ambulatory Visit: Payer: Medicare Other

## 2022-10-08 ENCOUNTER — Inpatient Hospital Stay: Payer: PPO | Attending: Nurse Practitioner

## 2022-10-08 DIAGNOSIS — D46Z Other myelodysplastic syndromes: Secondary | ICD-10-CM

## 2022-10-08 DIAGNOSIS — Z5111 Encounter for antineoplastic chemotherapy: Secondary | ICD-10-CM | POA: Diagnosis present

## 2022-10-08 MED ORDER — LUSPATERCEPT-AAMT 75 MG ~~LOC~~ SOLR
100.0000 mg | SUBCUTANEOUS | Status: DC
  Administered 2022-10-08: 100 mg via SUBCUTANEOUS
  Filled 2022-10-08: qty 0.5

## 2022-10-29 ENCOUNTER — Inpatient Hospital Stay: Payer: PPO | Attending: Nurse Practitioner

## 2022-10-29 DIAGNOSIS — Z5111 Encounter for antineoplastic chemotherapy: Secondary | ICD-10-CM | POA: Diagnosis not present

## 2022-10-29 DIAGNOSIS — M79661 Pain in right lower leg: Secondary | ICD-10-CM | POA: Insufficient documentation

## 2022-10-29 DIAGNOSIS — D46Z Other myelodysplastic syndromes: Secondary | ICD-10-CM | POA: Diagnosis present

## 2022-10-29 DIAGNOSIS — R051 Acute cough: Secondary | ICD-10-CM | POA: Insufficient documentation

## 2022-10-29 MED ORDER — LUSPATERCEPT-AAMT 75 MG ~~LOC~~ SOLR
100.0000 mg | SUBCUTANEOUS | Status: DC
  Administered 2022-10-29: 100 mg via SUBCUTANEOUS
  Filled 2022-10-29: qty 0.5

## 2022-10-29 NOTE — Progress Notes (Signed)
Per [place] per Leafy Kindle, ok to give rebloyzl without recent labs.  Larene Beach, PharmD  \\

## 2022-10-30 ENCOUNTER — Encounter: Payer: Self-pay | Admitting: Hematology and Oncology

## 2022-11-14 ENCOUNTER — Encounter: Payer: Self-pay | Admitting: Podiatry

## 2022-11-14 ENCOUNTER — Ambulatory Visit (INDEPENDENT_AMBULATORY_CARE_PROVIDER_SITE_OTHER): Payer: PPO | Admitting: Podiatry

## 2022-11-14 DIAGNOSIS — M79675 Pain in left toe(s): Secondary | ICD-10-CM | POA: Diagnosis not present

## 2022-11-14 DIAGNOSIS — B351 Tinea unguium: Secondary | ICD-10-CM | POA: Diagnosis not present

## 2022-11-14 DIAGNOSIS — E1122 Type 2 diabetes mellitus with diabetic chronic kidney disease: Secondary | ICD-10-CM

## 2022-11-14 DIAGNOSIS — M79674 Pain in right toe(s): Secondary | ICD-10-CM

## 2022-11-14 DIAGNOSIS — N183 Chronic kidney disease, stage 3 unspecified: Secondary | ICD-10-CM

## 2022-11-14 DIAGNOSIS — Z794 Long term (current) use of insulin: Secondary | ICD-10-CM

## 2022-11-14 NOTE — Progress Notes (Signed)
This patient returns to my office for at risk foot care.  This patient requires this care by a professional since this patient will be at risk due to having diabetes with kidney disease.  This patient is unable to cut nails himself since the patient cannot reach his nails.These nails are painful walking and wearing shoes.  This patient presents for at risk foot care today.  General Appearance  Alert, conversant and in no acute stress.  Vascular  Dorsalis pedis and posterior tibial  pulses are  weakly palpable  bilaterally.  Capillary return is within normal limits  bilaterally. Temperature is within normal limits  bilaterally.  Neurologic  Senn-Weinstein monofilament wire test within normal limits  bilaterally. Muscle power within normal limits bilaterally.  Nails Thick disfigured discolored nails with subungual debris  from hallux to fifth toes bilaterally. No evidence of bacterial infection or drainage bilaterally.  Orthopedic  No limitations of motion  feet .  No crepitus or effusions noted.  No bony pathology or digital deformities noted.  Skin  normotropic skin with no porokeratosis noted bilaterally.  No signs of infections or ulcers noted.     Onychomycosis  Pain in right toes  Pain in left toes  Consent was obtained for treatment procedures.   Mechanical debridement of nails 1-5  bilaterally performed with a nail nipper.  Filed with dremel without incident.    Return office visit    3 months                 Told patient to return for periodic foot care and evaluation due to potential at risk complications.   Gardiner Barefoot DPM

## 2022-11-15 ENCOUNTER — Encounter (INDEPENDENT_AMBULATORY_CARE_PROVIDER_SITE_OTHER): Payer: PPO | Admitting: Ophthalmology

## 2022-11-15 DIAGNOSIS — H35033 Hypertensive retinopathy, bilateral: Secondary | ICD-10-CM

## 2022-11-15 DIAGNOSIS — I10 Essential (primary) hypertension: Secondary | ICD-10-CM

## 2022-11-15 DIAGNOSIS — H43811 Vitreous degeneration, right eye: Secondary | ICD-10-CM

## 2022-11-15 DIAGNOSIS — H35342 Macular cyst, hole, or pseudohole, left eye: Secondary | ICD-10-CM | POA: Diagnosis not present

## 2022-11-15 DIAGNOSIS — H353132 Nonexudative age-related macular degeneration, bilateral, intermediate dry stage: Secondary | ICD-10-CM | POA: Diagnosis not present

## 2022-11-15 DIAGNOSIS — H33302 Unspecified retinal break, left eye: Secondary | ICD-10-CM

## 2022-11-18 ENCOUNTER — Other Ambulatory Visit: Payer: Self-pay

## 2022-11-18 ENCOUNTER — Emergency Department: Payer: PPO

## 2022-11-18 DIAGNOSIS — F0280 Dementia in other diseases classified elsewhere without behavioral disturbance: Secondary | ICD-10-CM | POA: Diagnosis present

## 2022-11-18 DIAGNOSIS — Z7984 Long term (current) use of oral hypoglycemic drugs: Secondary | ICD-10-CM

## 2022-11-18 DIAGNOSIS — Z833 Family history of diabetes mellitus: Secondary | ICD-10-CM

## 2022-11-18 DIAGNOSIS — Z7982 Long term (current) use of aspirin: Secondary | ICD-10-CM

## 2022-11-18 DIAGNOSIS — Z8619 Personal history of other infectious and parasitic diseases: Secondary | ICD-10-CM

## 2022-11-18 DIAGNOSIS — N179 Acute kidney failure, unspecified: Principal | ICD-10-CM | POA: Diagnosis present

## 2022-11-18 DIAGNOSIS — K219 Gastro-esophageal reflux disease without esophagitis: Secondary | ICD-10-CM | POA: Diagnosis present

## 2022-11-18 DIAGNOSIS — Z79899 Other long term (current) drug therapy: Secondary | ICD-10-CM

## 2022-11-18 DIAGNOSIS — Z961 Presence of intraocular lens: Secondary | ICD-10-CM | POA: Diagnosis present

## 2022-11-18 DIAGNOSIS — Z85828 Personal history of other malignant neoplasm of skin: Secondary | ICD-10-CM

## 2022-11-18 DIAGNOSIS — Z794 Long term (current) use of insulin: Secondary | ICD-10-CM

## 2022-11-18 DIAGNOSIS — G309 Alzheimer's disease, unspecified: Secondary | ICD-10-CM | POA: Diagnosis present

## 2022-11-18 DIAGNOSIS — D631 Anemia in chronic kidney disease: Secondary | ICD-10-CM | POA: Diagnosis present

## 2022-11-18 DIAGNOSIS — E1122 Type 2 diabetes mellitus with diabetic chronic kidney disease: Secondary | ICD-10-CM | POA: Diagnosis present

## 2022-11-18 DIAGNOSIS — Z8719 Personal history of other diseases of the digestive system: Secondary | ICD-10-CM

## 2022-11-18 DIAGNOSIS — E872 Acidosis, unspecified: Secondary | ICD-10-CM | POA: Diagnosis present

## 2022-11-18 DIAGNOSIS — Z9841 Cataract extraction status, right eye: Secondary | ICD-10-CM

## 2022-11-18 DIAGNOSIS — E663 Overweight: Secondary | ICD-10-CM | POA: Diagnosis present

## 2022-11-18 DIAGNOSIS — Z9842 Cataract extraction status, left eye: Secondary | ICD-10-CM

## 2022-11-18 DIAGNOSIS — E11319 Type 2 diabetes mellitus with unspecified diabetic retinopathy without macular edema: Secondary | ICD-10-CM | POA: Diagnosis present

## 2022-11-18 DIAGNOSIS — C9000 Multiple myeloma not having achieved remission: Secondary | ICD-10-CM | POA: Diagnosis present

## 2022-11-18 DIAGNOSIS — R531 Weakness: Secondary | ICD-10-CM | POA: Diagnosis not present

## 2022-11-18 DIAGNOSIS — N401 Enlarged prostate with lower urinary tract symptoms: Secondary | ICD-10-CM | POA: Diagnosis present

## 2022-11-18 DIAGNOSIS — N1831 Chronic kidney disease, stage 3a: Secondary | ICD-10-CM | POA: Diagnosis present

## 2022-11-18 DIAGNOSIS — D61818 Other pancytopenia: Secondary | ICD-10-CM | POA: Diagnosis present

## 2022-11-18 DIAGNOSIS — Z8616 Personal history of COVID-19: Secondary | ICD-10-CM

## 2022-11-18 DIAGNOSIS — Z87891 Personal history of nicotine dependence: Secondary | ICD-10-CM

## 2022-11-18 DIAGNOSIS — Z888 Allergy status to other drugs, medicaments and biological substances status: Secondary | ICD-10-CM

## 2022-11-18 DIAGNOSIS — R350 Frequency of micturition: Secondary | ICD-10-CM | POA: Diagnosis present

## 2022-11-18 DIAGNOSIS — Z66 Do not resuscitate: Secondary | ICD-10-CM | POA: Diagnosis present

## 2022-11-18 DIAGNOSIS — F05 Delirium due to known physiological condition: Secondary | ICD-10-CM | POA: Diagnosis present

## 2022-11-18 DIAGNOSIS — I129 Hypertensive chronic kidney disease with stage 1 through stage 4 chronic kidney disease, or unspecified chronic kidney disease: Secondary | ICD-10-CM | POA: Diagnosis present

## 2022-11-18 DIAGNOSIS — Z86010 Personal history of colonic polyps: Secondary | ICD-10-CM

## 2022-11-18 DIAGNOSIS — E785 Hyperlipidemia, unspecified: Secondary | ICD-10-CM | POA: Diagnosis present

## 2022-11-18 DIAGNOSIS — Z8249 Family history of ischemic heart disease and other diseases of the circulatory system: Secondary | ICD-10-CM

## 2022-11-18 DIAGNOSIS — Z882 Allergy status to sulfonamides status: Secondary | ICD-10-CM

## 2022-11-18 DIAGNOSIS — C9141 Hairy cell leukemia, in remission: Secondary | ICD-10-CM | POA: Diagnosis present

## 2022-11-18 DIAGNOSIS — Z86008 Personal history of in-situ neoplasm of other site: Secondary | ICD-10-CM

## 2022-11-18 DIAGNOSIS — Z6829 Body mass index (BMI) 29.0-29.9, adult: Secondary | ICD-10-CM

## 2022-11-18 DIAGNOSIS — K5900 Constipation, unspecified: Secondary | ICD-10-CM | POA: Diagnosis present

## 2022-11-18 LAB — BASIC METABOLIC PANEL
Anion gap: 9 (ref 5–15)
BUN: 29 mg/dL — ABNORMAL HIGH (ref 8–23)
CO2: 21 mmol/L — ABNORMAL LOW (ref 22–32)
Calcium: 8.7 mg/dL — ABNORMAL LOW (ref 8.9–10.3)
Chloride: 108 mmol/L (ref 98–111)
Creatinine, Ser: 2.27 mg/dL — ABNORMAL HIGH (ref 0.61–1.24)
GFR, Estimated: 27 mL/min — ABNORMAL LOW (ref >60)
Glucose, Bld: 82 mg/dL (ref 70–99)
Potassium: 3.8 mmol/L (ref 3.5–5.1)
Sodium: 138 mmol/L (ref 135–145)

## 2022-11-18 LAB — CBC
HCT: 29.5 % — ABNORMAL LOW (ref 39.0–52.0)
Hemoglobin: 9.2 g/dL — ABNORMAL LOW (ref 13.0–17.0)
MCH: 31.8 pg (ref 26.0–34.0)
MCHC: 31.2 g/dL (ref 30.0–36.0)
MCV: 102.1 fL — ABNORMAL HIGH (ref 80.0–100.0)
Platelets: 120 10*3/uL — ABNORMAL LOW (ref 150–400)
RBC: 2.89 MIL/uL — ABNORMAL LOW (ref 4.22–5.81)
RDW: 16.9 % — ABNORMAL HIGH (ref 11.5–15.5)
WBC: 6.8 10*3/uL (ref 4.0–10.5)
nRBC: 0.3 % — ABNORMAL HIGH (ref 0.0–0.2)

## 2022-11-18 LAB — TROPONIN I (HIGH SENSITIVITY): Troponin I (High Sensitivity): 25 ng/L — ABNORMAL HIGH (ref <18)

## 2022-11-18 NOTE — ED Notes (Signed)
Unable to obtain urine sample at this time. 

## 2022-11-18 NOTE — ED Triage Notes (Signed)
Arrived via EMS from [place]. EMS state staff reported patient has had increased urination and wanted patient evaluated for UTI. Patient alert, oriented to name place and situation, unable to recall year, states he is also having generalized weakness that began yesterday. Denies chest pain or shortness of breath. Clear speech. Resp even, unlabored on RA.

## 2022-11-19 ENCOUNTER — Inpatient Hospital Stay: Payer: PPO

## 2022-11-19 ENCOUNTER — Observation Stay: Payer: PPO

## 2022-11-19 ENCOUNTER — Inpatient Hospital Stay
Admission: EM | Admit: 2022-11-19 | Discharge: 2022-11-27 | DRG: 683 | Disposition: A | Discharge status: Skilled Nursing Facility | Payer: PPO | Source: Skilled Nursing Facility | Attending: Internal Medicine | Admitting: Internal Medicine

## 2022-11-19 ENCOUNTER — Inpatient Hospital Stay: Payer: PPO | Admitting: Oncology

## 2022-11-19 DIAGNOSIS — I10 Essential (primary) hypertension: Secondary | ICD-10-CM | POA: Diagnosis present

## 2022-11-19 DIAGNOSIS — D61818 Other pancytopenia: Secondary | ICD-10-CM | POA: Insufficient documentation

## 2022-11-19 DIAGNOSIS — E872 Acidosis, unspecified: Secondary | ICD-10-CM | POA: Insufficient documentation

## 2022-11-19 DIAGNOSIS — R531 Weakness: Secondary | ICD-10-CM | POA: Diagnosis not present

## 2022-11-19 DIAGNOSIS — F0280 Dementia in other diseases classified elsewhere without behavioral disturbance: Secondary | ICD-10-CM | POA: Diagnosis present

## 2022-11-19 DIAGNOSIS — N183 Chronic kidney disease, stage 3 unspecified: Secondary | ICD-10-CM | POA: Diagnosis present

## 2022-11-19 DIAGNOSIS — C9141 Hairy cell leukemia, in remission: Secondary | ICD-10-CM | POA: Diagnosis present

## 2022-11-19 DIAGNOSIS — E663 Overweight: Secondary | ICD-10-CM | POA: Insufficient documentation

## 2022-11-19 DIAGNOSIS — D649 Anemia, unspecified: Secondary | ICD-10-CM

## 2022-11-19 DIAGNOSIS — D472 Monoclonal gammopathy: Secondary | ICD-10-CM | POA: Diagnosis present

## 2022-11-19 DIAGNOSIS — N179 Acute kidney failure, unspecified: Secondary | ICD-10-CM

## 2022-11-19 DIAGNOSIS — N400 Enlarged prostate without lower urinary tract symptoms: Secondary | ICD-10-CM | POA: Diagnosis present

## 2022-11-19 LAB — URINALYSIS, ROUTINE W REFLEX MICROSCOPIC
Bilirubin Urine: NEGATIVE
Glucose, UA: NEGATIVE mg/dL
Ketones, ur: NEGATIVE mg/dL
Nitrite: NEGATIVE
Protein, ur: >=300 mg/dL — AB
Specific Gravity, Urine: 1.02 (ref 1.005–1.030)
Squamous Epithelial / HPF: NONE SEEN /HPF (ref 0–5)
pH: 5 (ref 5.0–8.0)

## 2022-11-19 LAB — GLUCOSE, CAPILLARY
Glucose-Capillary: 63 mg/dL — ABNORMAL LOW (ref 70–99)
Glucose-Capillary: 122 mg/dL — ABNORMAL HIGH (ref 70–99)
Glucose-Capillary: 228 mg/dL — ABNORMAL HIGH (ref 70–99)
Glucose-Capillary: 194 mg/dL — ABNORMAL HIGH (ref 70–99)
Glucose-Capillary: 205 mg/dL — ABNORMAL HIGH (ref 70–99)

## 2022-11-19 LAB — BASIC METABOLIC PANEL
Anion gap: 5 (ref 5–15)
BUN: 26 mg/dL — ABNORMAL HIGH (ref 8–23)
CO2: 22 mmol/L (ref 22–32)
Calcium: 8.2 mg/dL — ABNORMAL LOW (ref 8.9–10.3)
Chloride: 112 mmol/L — ABNORMAL HIGH (ref 98–111)
Creatinine, Ser: 2.26 mg/dL — ABNORMAL HIGH (ref 0.61–1.24)
GFR, Estimated: 28 mL/min — ABNORMAL LOW (ref >60)
Glucose, Bld: 58 mg/dL — ABNORMAL LOW (ref 70–99)
Potassium: 3.7 mmol/L (ref 3.5–5.1)
Sodium: 139 mmol/L (ref 135–145)

## 2022-11-19 LAB — TROPONIN I (HIGH SENSITIVITY): Troponin I (High Sensitivity): 22 ng/L — ABNORMAL HIGH (ref <18)

## 2022-11-19 MED ORDER — MELATONIN 5 MG PO TABS
2.5000 mg | ORAL_TABLET | Freq: Every day | ORAL | Status: DC
  Administered 2022-11-19 – 2022-11-26 (×8): 2.5 mg via ORAL
  Filled 2022-11-19 (×8): qty 1

## 2022-11-19 MED ORDER — SODIUM CHLORIDE 0.9 % IV SOLN: INTRAVENOUS | Status: AC

## 2022-11-19 MED ORDER — AMLODIPINE BESYLATE 10 MG PO TABS
10.0000 mg | ORAL_TABLET | Freq: Every day | ORAL | Status: DC
  Administered 2022-11-19 – 2022-11-27 (×8): 10 mg via ORAL
  Filled 2022-11-19 (×8): qty 1

## 2022-11-19 MED ORDER — ASPIRIN 81 MG PO TBEC
81.0000 mg | DELAYED_RELEASE_TABLET | Freq: Every day | ORAL | Status: DC
  Administered 2022-11-19 – 2022-11-27 (×8): 81 mg via ORAL
  Filled 2022-11-19 (×8): qty 1

## 2022-11-19 MED ORDER — ACETAMINOPHEN 650 MG RE SUPP: 650.0000 mg | Freq: Four times a day (QID) | RECTAL | Status: DC | PRN

## 2022-11-19 MED ORDER — ACETAMINOPHEN 325 MG PO TABS
650.0000 mg | ORAL_TABLET | Freq: Four times a day (QID) | ORAL | Status: DC | PRN
  Administered 2022-11-22 – 2022-11-27 (×3): 650 mg via ORAL
  Filled 2022-11-19 (×4): qty 2

## 2022-11-19 MED ORDER — ONDANSETRON HCL 4 MG/2ML IJ SOLN: 4.0000 mg | Freq: Four times a day (QID) | INTRAMUSCULAR | Status: DC | PRN

## 2022-11-19 MED ORDER — PRAVASTATIN SODIUM 20 MG PO TABS
20.0000 mg | ORAL_TABLET | Freq: Every evening | ORAL | Status: DC
  Administered 2022-11-19 – 2022-11-26 (×8): 20 mg via ORAL
  Filled 2022-11-19 (×8): qty 1

## 2022-11-19 MED ORDER — ONDANSETRON HCL 4 MG PO TABS: 4.0000 mg | ORAL_TABLET | Freq: Four times a day (QID) | ORAL | Status: DC | PRN

## 2022-11-19 MED ORDER — INSULIN ASPART 100 UNIT/ML IJ SOLN
0.0000 [IU] | Freq: Three times a day (TID) | INTRAMUSCULAR | Status: DC
  Administered 2022-11-19: 3 [IU] via SUBCUTANEOUS
  Administered 2022-11-19: 5 [IU] via SUBCUTANEOUS
  Administered 2022-11-20: 8 [IU] via SUBCUTANEOUS
  Administered 2022-11-20: 3 [IU] via SUBCUTANEOUS
  Administered 2022-11-21 (×2): 8 [IU] via SUBCUTANEOUS
  Administered 2022-11-21: 5 [IU] via SUBCUTANEOUS
  Administered 2022-11-22: 8 [IU] via SUBCUTANEOUS
  Administered 2022-11-22: 3 [IU] via SUBCUTANEOUS
  Administered 2022-11-22: 8 [IU] via SUBCUTANEOUS
  Administered 2022-11-23: 2 [IU] via SUBCUTANEOUS
  Administered 2022-11-23: 8 [IU] via SUBCUTANEOUS
  Administered 2022-11-23: 5 [IU] via SUBCUTANEOUS
  Administered 2022-11-24: 3 [IU] via SUBCUTANEOUS
  Administered 2022-11-24: 8 [IU] via SUBCUTANEOUS
  Administered 2022-11-24: 5 [IU] via SUBCUTANEOUS
  Administered 2022-11-25 – 2022-11-26 (×2): 8 [IU] via SUBCUTANEOUS
  Administered 2022-11-26: 5 [IU] via SUBCUTANEOUS
  Administered 2022-11-26: 3 [IU] via SUBCUTANEOUS
  Administered 2022-11-27: 8 [IU] via SUBCUTANEOUS
  Filled 2022-11-19 (×21): qty 1

## 2022-11-19 MED ORDER — SERTRALINE HCL 50 MG PO TABS
25.0000 mg | ORAL_TABLET | Freq: Every day | ORAL | Status: DC
  Administered 2022-11-19 – 2022-11-27 (×8): 25 mg via ORAL
  Filled 2022-11-19 (×8): qty 1

## 2022-11-19 MED ORDER — LACTATED RINGERS IV BOLUS
1000.0000 mL | Freq: Once | INTRAVENOUS | Status: AC
  Administered 2022-11-19: 1000 mL via INTRAVENOUS

## 2022-11-19 MED ORDER — INSULIN ASPART 100 UNIT/ML IJ SOLN
0.0000 [IU] | Freq: Every day | INTRAMUSCULAR | Status: DC
  Administered 2022-11-19: 2 [IU] via SUBCUTANEOUS
  Administered 2022-11-22: 3 [IU] via SUBCUTANEOUS
  Filled 2022-11-19 (×2): qty 1

## 2022-11-19 MED ORDER — TAMSULOSIN HCL 0.4 MG PO CAPS
0.4000 mg | ORAL_CAPSULE | Freq: Every day | ORAL | Status: DC
  Administered 2022-11-19 – 2022-11-27 (×8): 0.4 mg via ORAL
  Filled 2022-11-19 (×8): qty 1

## 2022-11-19 MED ORDER — PANTOPRAZOLE SODIUM 40 MG PO TBEC
40.0000 mg | DELAYED_RELEASE_TABLET | Freq: Two times a day (BID) | ORAL | Status: DC
  Administered 2022-11-19 – 2022-11-27 (×16): 40 mg via ORAL
  Filled 2022-11-19 (×16): qty 1

## 2022-11-19 MED ORDER — ENOXAPARIN SODIUM 30 MG/0.3ML IJ SOSY
30.0000 mg | PREFILLED_SYRINGE | INTRAMUSCULAR | Status: DC
  Administered 2022-11-19 – 2022-11-27 (×9): 30 mg via SUBCUTANEOUS
  Filled 2022-11-19 (×9): qty 0.3

## 2022-11-19 NOTE — Assessment & Plan Note (Addendum)
Creatinine peaked at 2.51 on 11/22/2022.  Creatinine 1.89 upon discharge with a GFR of 34.  As per nephrology okay to follow-up as outpatient and can start losartan instead of lisinopril as outpatient.  Follow-up in 2 weeks with nephrology.

## 2022-11-19 NOTE — Assessment & Plan Note (Addendum)
Patient will go out to rehab today.  Will hold statin just in case contributing to his weakness.

## 2022-11-19 NOTE — Assessment & Plan Note (Addendum)
Will start low-dose insulin back tonight 6 units.  Will give 2 units prior to meals.  Continue to monitor sugars before every meal and nightly.

## 2022-11-19 NOTE — ED Notes (Signed)
Pt had 6 beats of Vtach, a record was printed and placed in the pt's cubby, NP Foust was notified. Pt was asymptomatic.

## 2022-11-19 NOTE — ED Provider Notes (Signed)
[facility] Provider Note    Event Date/Time   First MD Initiated Contact with Patient 11/19/22 [phone]     (approximate)   History   Weakness and Urinary Frequency   HPI  Alex Smith is a 88 y.o. male who presents to the ED for evaluation of Weakness and Urinary Frequency   I reviewed oncology clinic visit from 1/9.  Routine follow-up for his MDS.  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  Otherwise history of CKD, HTN, DM.  Looks like baseline creatinine is around 1.4  Patient presents to the ED from his local SNF for evaluation of increasing generalized weakness for the past couple days.  He denies any pain, fever, dysuria or any complaints beyond generalized weakness.  No falls or injuries.   Physical Exam   Triage Vital Signs: ED Triage Vitals  Enc Vitals Group     BP 11/18/22 2313 (!) 157/93     Pulse Rate 11/18/22 2313 93     Resp 11/18/22 2313 20     Temp 11/18/22 2313 99.1 F (37.3 C)     Temp Source 11/18/22 2313 Oral     SpO2 11/18/22 2313 95 %     Weight 11/18/22 2314 190 lb (86.2 kg)     Height 11/18/22 2314 5\' 7"  (1.702 m)     Head Circumference --      Peak Flow --      Pain Score 11/18/22 2313 0     Pain Loc --      Pain Edu? --      Excl. in [place]? --     Most recent vital signs: Vitals:   11/19/22 0200 11/19/22 0230  BP: (!) 168/72 (!) 161/70  Pulse: 93 87  Resp: (!) 22 19  Temp:    SpO2: 97% 96%    General: Awake, no distress.  CV:  Good peripheral perfusion.  Resp:  Normal effort.  Abd:  No distention.  MSK:  No deformity noted.  Neuro:  No focal deficits appreciated. Other:     ED Results / Procedures / Treatments   Labs (all labs ordered are listed, but only abnormal results are displayed) Labs Reviewed  BASIC METABOLIC PANEL - Abnormal; Notable for the following components:      Result Value   CO2 21 (*)    BUN 29 (*)    Creatinine, Ser 2.27 (*)    Calcium 8.7 (*)    GFR, Estimated 27  (*)    All other components within normal limits  CBC - Abnormal; Notable for the following components:   RBC 2.89 (*)    Hemoglobin 9.2 (*)    HCT 29.5 (*)    MCV 102.1 (*)    RDW 16.9 (*)    Platelets 120 (*)    nRBC 0.3 (*)    All other components within normal limits  URINALYSIS, ROUTINE W REFLEX MICROSCOPIC - Abnormal; Notable for the following components:   Color, Urine YELLOW (*)    APPearance HAZY (*)    Hgb urine dipstick SMALL (*)    Protein, ur >=300 (*)    Leukocytes,Ua SMALL (*)    Bacteria, UA RARE (*)    All other components within normal limits  TROPONIN I (HIGH SENSITIVITY) - Abnormal; Notable for the following components:   Troponin I (High Sensitivity) 25 (*)    All other components within normal limits  TROPONIN I (HIGH SENSITIVITY) - Abnormal; Notable for the following  components:   Troponin I (High Sensitivity) 22 (*)    All other components within normal limits  URINE CULTURE  HEMOGLOBIN [id number]  BASIC METABOLIC PANEL    EKG A-fib with a rate of 81 bpm.  Couple PVCs.  Normal axis.  QTc 448.  No STEMI.  RADIOLOGY CXR interpreted by me without evidence of acute cardiopulmonary pathology.  Official radiology report(s): DG Chest 2 View  Result Date: 11/18/2022 CLINICAL DATA:  Generalized weakness EXAM: CHEST - 2 VIEW COMPARISON:  04/09/2022 FINDINGS: Cardiac and mediastinal contours are unchanged. No focal pulmonary opacity. No pleural effusion or pneumothorax. No acute osseous abnormality. IMPRESSION: No acute cardiopulmonary process. Electronically Signed   By: Merilyn Baba M.D.   On: 11/18/2022 23:48    PROCEDURES and INTERVENTIONS:  .1-3 Lead EKG Interpretation  Performed by: Vladimir Crofts, MD Authorized by: Vladimir Crofts, MD     Interpretation: normal     ECG rate:  80   ECG rate assessment: normal     Rhythm: atrial fibrillation     Ectopy: PVCs     Conduction: normal     Medications  amLODipine (NORVASC) tablet 10 mg (has no administration  in time range)  aspirin EC tablet 81 mg (has no administration in time range)  pravastatin (PRAVACHOL) tablet 20 mg (has no administration in time range)  sertraline (ZOLOFT) tablet 25 mg (has no administration in time range)  pantoprazole (PROTONIX) EC tablet 40 mg (40 mg Oral Not Given 11/19/22 0255)  tamsulosin (FLOMAX) capsule 0.4 mg (has no administration in time range)  melatonin tablet 2.5 mg (has no administration in time range)  enoxaparin (LOVENOX) injection 30 mg (has no administration in time range)  acetaminophen (TYLENOL) tablet 650 mg (has no administration in time range)    Or  acetaminophen (TYLENOL) suppository 650 mg (has no administration in time range)  ondansetron (ZOFRAN) tablet 4 mg (has no administration in time range)    Or  ondansetron (ZOFRAN) injection 4 mg (has no administration in time range)  insulin aspart (novoLOG) injection 0-15 Units (has no administration in time range)  insulin aspart (novoLOG) injection 0-5 Units (has no administration in time range)  0.9 %  sodium chloride infusion ( Intravenous New Bag/Given 11/19/22 0300)  lactated ringers bolus 1,000 mL (0 mLs Intravenous Stopped 11/19/22 0222)     IMPRESSION / MDM / ASSESSMENT AND PLAN / ED COURSE  I reviewed the triage vital signs and the nursing notes.  Differential diagnosis includes, but is not limited to, stroke, sepsis, dehydration, AKI  {Patient presents with symptoms of an acute illness or injury that is potentially life-threatening.  88 year old presents to the ED with generalized weakness for couple days with evidence of a AKI that I suspect is prerenal requiring medical admission.  Blood work with increasing creatinine from his baseline with slightly elevated BUN.  No signs of focal weakness or sensation changes to suggest stroke or CNS derangement.  CBC with chronic macrocytic anemia.  Troponin is just marginally elevated but he has no cardiopulmonary symptoms.  Urine with small leukocytes  and will be sent for culture.  No antibiotics at this point considering his lack of urinary symptoms, suprapubic pain and I doubt cystitis.  Consulted with medicine who agrees to admit  Clinical Course as of 11/19/22 0307  Tue Nov 19, 2022  0228 I consult with hospitalist who agrees to admit [DS]    Clinical Course User Index [DS] Vladimir Crofts, MD     FINAL CLINICAL  IMPRESSION(S) / ED DIAGNOSES   Final diagnoses:  AKI (acute kidney injury)  Generalized weakness     Rx / DC Orders   ED Discharge Orders     None        Note:  This document was prepared using Dragon voice recognition software and may include unintentional dictation errors.   Vladimir Crofts, MD 11/19/22 [phone]

## 2022-11-19 NOTE — H&P (Signed)
History and Physical    Patient: Alex Smith [id number] DOB: 02-20-1935 DOA: 11/19/2022 DOS: the patient was seen and examined on 11/19/2022 PCP: [facility]  Patient coming from: SNF  Chief Complaint:  Chief Complaint  Patient presents with   Weakness   Urinary Frequency    HPI: Alex Smith is a 88 y.o. male with medical history significant for HTN, smoldering myeloma, hairy cell leukemia, MDS on Luspatercept for anemia, followed by Dr. Janese Banks of oncology, as well as insulin-dependent type 2 diabetes, BPH, CKD 3A, Alzheimer's dementia, HTN, who was sent to the ED from SNF for evaluation of weakness and dysuria and concern for UTI.  Patient has had no fever or chills or vomiting or diarrhea. ED course and Data review: Tmax 99.1, pulse 93, BP 157/93, otherwise normal vitals.  CBC unremarkable with hemoglobin at baseline at 9.2.  BMP notable for creatinine of 2.27 up from baseline of 1.45.  Troponin 22.  Urinalysis unremarkable. EKG, personally viewed and interpreted undetermined rhythm likely sinus at 87 with no acute ST-T chest x-ray nonacute Patient given a 1 L LR bolus and hospitalist consulted for admission     Past Medical History:  Diagnosis Date   Alzheimer's dementia ([place]) 08/18/2021   Anemia    B12 deficiency    Basal cell carcinoma 03/30/2018   left ant nasal ala   Basal cell carcinoma of face 01/19/2013   right distal dorsum nose   BPH (benign prostatic hypertrophy)    followed by urology, discharged (Dr. Bernardo Heater)   CAP (community acquired pneumonia) 02/15/2015   CKD stage 3 due to type 2 diabetes mellitus ([place]) 11/14/2017   Colon polyps    Diverticulosis    Dysplastic nevus 10/27/2006   right med calf   GERD (gastroesophageal reflux disease)    Hairy cell leukemia ([place]) 2006   recurrent, seizure on rituxan, now on cladribine [name])   History of pneumonia 2000's   "once" (07/07/2012)   History of shingles    HLD (hyperlipidemia)     Hypertension    Pneumonia    Shortness of breath dyspnea    Squamous cell carcinoma in situ 07/05/2020   Left lat. pretibial. 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 EDC 09/07/2020   Squamous cell carcinoma of skin 04/03/2020   Left cheek. WD SCC   Systolic murmur [id number]   Type 2 diabetes, controlled, with retinopathy ([place])    Past Surgical History:  Procedure Laterality Date   25 GAUGE PARS PLANA VITRECTOMY WITH 20 GAUGE MVR PORT FOR MACULAR HOLE  07/07/2012   Procedure: 25 GAUGE PARS PLANA VITRECTOMY WITH 20 GAUGE MVR PORT FOR MACULAR HOLE;  Surgeon: Hayden Pedro, MD;  Location: [facility];  Service: Ophthalmology;  Laterality: Left;   BONE MARROW BIOPSY  2016   CARDIOVASCULAR STRESS TEST  2013   treadmill - no evidence ischemia, EF 61%   CATARACT EXTRACTION W/ INTRAOCULAR LENS  IMPLANT, BILATERAL  ~ 2010   COLONOSCOPY  2014   [place] WNL no rpt needed, h/o polyps   EYE SURGERY Left 06/2012   laser surgery   GAS INSERTION  07/07/2012   Procedure: INSERTION OF GAS;  Surgeon: Hayden Pedro, MD;  Location: [place];  Service: Ophthalmology;  Laterality: Left;  C3F8   PERIPHERAL VASCULAR CATHETERIZATION N/A 02/23/2015   Procedure: Glori Luis Cath Insertion;  Surgeon: Algernon Huxley, MD;  Location: [place] [facility];  Service: Cardiovascular;  Laterality: N/A;   PORTA CATH REMOVAL N/A 02/16/2020   Procedure: PORTA CATH REMOVAL;  Surgeon: Algernon Huxley, MD;  Location: [place];  Service: Cardiovascular;  Laterality: N/A;   SERUM PATCH  07/07/2012   Procedure: SERUM PATCH;  Surgeon: Hayden Pedro, MD;  Location: [place];  Service: Ophthalmology;  Laterality: Left;   SKIN CANCER EXCISION     "all over my face" (07/07/2012)   Social History:  reports that he quit smoking about 53 years ago. His smoking use included cigarettes. He has a 50.00 pack-year smoking history. He has never used smokeless tobacco. He reports that he does not currently  use alcohol. He reports that he does not use drugs.  Allergies  Allergen Reactions   Rituximab Rash    Chest tightness Chest tightness   Blood-Group Specific Substance Other (See Comments)    Had a post transfusion reaction of red blood cells; NOW REQUIRES WASHED BLOOD CELLS Had a post transfusion reaction of red blood cells; NOW REQUIRES WASHED BLOOD CELLS   Primaxin [Imipenem] Other (See Comments)    Possible allergy Tolerates cephalosporins   Voriconazole Other (See Comments)   Sulfa Antibiotics Itching and Rash   Sulfacetamide Sodium Itching and Rash    Family History  Problem Relation Age of Onset   Dementia Mother    Heart failure Father 54   Cancer Sister        breast   Diabetes Paternal Uncle    Diabetes Paternal Aunt    CAD Brother 74       MI   Stroke Neg Hx     Prior to Admission medications   Medication Sig Start Date End Date Taking? Authorizing Provider  albuterol (VENTOLIN HFA) 108 (90 Base) MCG/ACT inhaler Inhale 2 puffs into the lungs every 6 (six) hours as needed for wheezing or shortness of breath. 04/20/22   Nolberto Hanlon, MD  alum & mag hydroxide-simeth (MAALOX/MYLANTA) 200-200-20 MG/5ML suspension Take 30 mLs by mouth every 4 (four) hours as needed for indigestion or heartburn. 04/20/22   Nolberto Hanlon, MD  amLODipine (NORVASC) 10 MG tablet Take 1 tablet (10 mg total) by mouth daily. 04/20/22   Nolberto Hanlon, MD  aspirin EC 81 MG tablet Take 81 mg by mouth daily.    [provider]  doxazosin (CARDURA) 1 MG tablet TAKE ONE TABLET BY MOUTH EVERY DAY Patient not taking: Reported on 08/27/2022 08/31/19   Ria Bush, MD  famotidine (PEPCID) 20 MG tablet Take 20 mg by mouth daily.    [provider]  feeding supplement (ENSURE ENLIVE / ENSURE PLUS) LIQD Take 237 mLs by mouth 2 (two) times daily between meals. Patient not taking: Reported on 08/27/2022 08/22/21   Antonieta Pert, MD  insulin glargine (LANTUS) 100 UNIT/ML Solostar Pen Inject 15 Units into  the skin at bedtime.    [provider]  melatonin 3 MG TABS tablet Take 3 mg by mouth at bedtime.    [provider]  Multiple Vitamin (MULTIVITAMIN WITH MINERALS) TABS tablet Take 1 tablet by mouth daily. 08/23/21   Antonieta Pert, MD  pantoprazole (PROTONIX) 40 MG tablet Take 1 tablet (40 mg total) by mouth 2 (two) times daily. 04/20/22   Nolberto Hanlon, MD  pravastatin (PRAVACHOL) 20 MG tablet TAKE ONE TABLET EVERY EVENING 03/16/20   Ria Bush, MD  sertraline (ZOLOFT) 25 MG tablet Take 25 mg by mouth daily.    [provider]  sitaGLIPtin (JANUVIA) 50 MG tablet Take  50 mg by mouth daily.    [provider]  tamsulosin (FLOMAX) 0.4 MG CAPS capsule TAKE 1 CAPSULE BY MOUTH EVERY DAY 12/11/17   Ria Bush, MD  vitamin B-12 (CYANOCOBALAMIN) 1000 MCG tablet Take 1 tablet (1,000 mcg total) by mouth daily. 06/01/19   Ria Bush, MD  vitamin E 400 UNIT capsule Take 400 Units by mouth daily.    [provider]    Physical Exam: Vitals:   11/19/22 0100 11/19/22 0130 11/19/22 0200 11/19/22 0230  BP: (!) 154/102 (!) 152/74 (!) 168/72 (!) 161/70  Pulse: 98 90 93 87  Resp: 18 (!) 21 (!) 22 19  Temp:      TempSrc:      SpO2: 97% 98% 97% 96%  Weight:      Height:       Physical Exam Vitals and nursing note reviewed.  Constitutional:      General: He is sleeping. He is not in acute distress. HENT:     Head: Normocephalic and atraumatic.  Cardiovascular:     Rate and Rhythm: Normal rate and regular rhythm.     Heart sounds: Normal heart sounds.  Pulmonary:     Effort: Pulmonary effort is normal.     Breath sounds: Normal breath sounds.  Abdominal:     Palpations: Abdomen is soft.     Tenderness: There is no abdominal tenderness.  Neurological:     General: No focal deficit present.     Mental Status: He is easily aroused.     Labs on Admission: I have personally reviewed following labs and imaging studies  CBC: Recent Labs  Lab  11/18/22 2318  WBC 6.8  HGB 9.2*  HCT 29.5*  MCV 102.1*  PLT 123456*   Basic Metabolic Panel: Recent Labs  Lab 11/18/22 2318  NA 138  K 3.8  CL 108  CO2 21*  GLUCOSE 82  BUN 29*  CREATININE 2.27*  CALCIUM 8.7*   GFR: Estimated Creatinine Clearance: 24.5 mL/min (A) (by C-G formula based on SCr of 2.27 mg/dL (H)). Liver Function Tests: No results for input(s): "AST", "ALT", "ALKPHOS", "BILITOT", "PROT", "ALBUMIN" in the last 168 hours. No results for input(s): "LIPASE", "AMYLASE" in the last 168 hours. No results for input(s): "AMMONIA" in the last 168 hours. Coagulation Profile: No results for input(s): "INR", "PROTIME" in the last 168 hours. Cardiac Enzymes: No results for input(s): "CKTOTAL", "CKMB", "CKMBINDEX", "TROPONINI" in the last 168 hours. BNP (last 3 results) No results for input(s): "PROBNP" in the last 8760 hours. HbA1C: No results for input(s): "HGBA1C" in the last 72 hours. CBG: No results for input(s): "GLUCAP" in the last 168 hours. Lipid Profile: No results for input(s): "CHOL", "HDL", "LDLCALC", "TRIG", "CHOLHDL", "LDLDIRECT" in the last 72 hours. Thyroid Function Tests: No results for input(s): "TSH", "T4TOTAL", "FREET4", "T3FREE", "THYROIDAB" in the last 72 hours. Anemia Panel: No results for input(s): "VITAMINB12", "FOLATE", "FERRITIN", "TIBC", "IRON", "RETICCTPCT" in the last 72 hours. Urine analysis:    Component Value Date/Time   COLORURINE YELLOW (A) 11/19/2022 0111   APPEARANCEUR HAZY (A) 11/19/2022 0111   LABSPEC 1.020 11/19/2022 0111   PHURINE 5.0 11/19/2022 0111   GLUCOSEU NEGATIVE 11/19/2022 0111   HGBUR SMALL (A) 11/19/2022 0111   BILIRUBINUR NEGATIVE 11/19/2022 0111   BILIRUBINUR Negative 08/02/2016 1212   KETONESUR NEGATIVE 11/19/2022 0111   PROTEINUR >=300 (A) 11/19/2022 0111   UROBILINOGEN 0.2 08/02/2016 1212   NITRITE NEGATIVE 11/19/2022 0111   LEUKOCYTESUR SMALL (A) 11/19/2022 0111  Radiological Exams on Admission: DG  Chest 2 View  Result Date: 11/18/2022 CLINICAL DATA:  Generalized weakness EXAM: CHEST - 2 VIEW COMPARISON:  04/09/2022 FINDINGS: Cardiac and mediastinal contours are unchanged. No focal pulmonary opacity. No pleural effusion or pneumothorax. No acute osseous abnormality. IMPRESSION: No acute cardiopulmonary process. Electronically Signed   By: Merilyn Baba M.D.   On: 11/18/2022 23:48     Data Reviewed: Relevant notes from primary care and specialist visits, past discharge summaries as available in EHR, including Care Everywhere. Prior diagnostic testing as pertinent to current admission diagnoses Updated medications and problem lists for reconciliation ED course, including vitals, labs, imaging, treatment and response to treatment Triage notes, nursing and pharmacy notes and ED provider's notes Notable results as noted in HPI   Assessment and Plan: * Generalized weakness Physical deconditioning Uncertain etiology for decline.  Possible dehydration Monitor response to IV hydration  Acute renal failure superimposed on stage 3a chronic kidney disease Creatinine 2.27, up from baseline of 1.45 Presumed prerenal IV hydration and monitor Bladder scan Patient denies pain but can consider CT renal stone study to evaluate for obstructive etiology  BPH (benign prostatic hyperplasia) Continue tamsulosin  Alzheimer's dementia Dementia precautions  Hairy cell leukemia, in remission Smoldering myeloma MDS Patient not currently on chemo Gets Luspatercept for anemia Followed by Dr. Janese Banks, last seen in January 2024  Type 2 diabetes mellitus with stage 3 chronic kidney disease Sliding scale insulin coverage  Essential hypertension Continue amlodipine     DVT prophylaxis: Lovenox  Consults: none  Advance Care Planning:   Code Status: Prior   Family Communication: none  Disposition Plan: Back to previous home environment  Severity of Illness: The appropriate patient status for  this patient is OBSERVATION. Observation status is judged to be reasonable and necessary in order to provide the required intensity of service to ensure the patient's safety. The patient's presenting symptoms, physical exam findings, and initial radiographic and laboratory data in the context of their medical condition is felt to place them at decreased risk for further clinical deterioration. Furthermore, it is anticipated that the patient will be medically stable for discharge from the hospital within 2 midnights of admission.   Author: Athena Masse, MD 11/19/2022 2:50 AM  For on call review [email or web address].

## 2022-11-19 NOTE — Progress Notes (Signed)
This is a nonbillable note.  Patient came in from assisted living facility, currently admitted on observation for physical deconditioning with generalized weakness. Patient was seen and examined at bedside this morning He tells me his condition is pretty much the same. He however denied any chest pain, cough, nausea vomiting abdominal pain or urinary complaints.  Physical Exam Vitals and nursing note reviewed.  Constitutional:      General: He is sleeping. He is not in acute distress. HENT:     Head: Normocephalic and atraumatic.  Cardiovascular:     Rate and Rhythm: Normal rate and regular rhythm.  Pulmonary:     Effort: Pulmonary effort is normal.  Abdominal:     Palpations: Abdomen is soft.  Neurological:     General: No focal deficit present.    Please refer to detailed assessment and plan as documented by admitting physician. Renal function has remained stable but still high will look forward to an improvement with IV hydration if not to consult nephrologist We will obtain renal ultrasound  CT scan with contrast cannot be obtained at this time given renal function.

## 2022-11-19 NOTE — Assessment & Plan Note (Addendum)
Smoldering myeloma MDS Patient not currently on chemo Gets Luspatercept for anemia Follow-up with Dr. Smith Robert as outpatient. Had bone marrow biopsy on 4/10.  Results will need to be followed up as outpatient.

## 2022-11-19 NOTE — ED Notes (Signed)
ED Provider at bedside. 

## 2022-11-19 NOTE — Assessment & Plan Note (Signed)
Continue tamsulosin.

## 2022-11-19 NOTE — Assessment & Plan Note (Addendum)
Continue amlodipine.  Will start losartan tomorrow.

## 2022-11-19 NOTE — ED Notes (Signed)
PT brought to ed rm 2 at this time, this RN now assuming care.  

## 2022-11-19 NOTE — Progress Notes (Signed)
PHARMACIST - PHYSICIAN COMMUNICATION  CONCERNING:  Enoxaparin (Lovenox) for DVT Prophylaxis    RECOMMENDATION: Patient was prescribed enoxaprin 40mg  q24 hours for VTE prophylaxis.   Filed Weights   11/18/22 2314  Weight: 86.2 kg (190 lb)    Body mass index is 29.76 kg/m.  Estimated Creatinine Clearance: 24.5 mL/min (A) (by C-G formula based on SCr of 2.27 mg/dL (H)).  Patient is candidate for enoxaparin 30mg  every 24 hours based on CrCl <22ml/min or Weight <45kg  DESCRIPTION: Pharmacy has adjusted enoxaparin dose per [facility] policy.  Patient is now receiving enoxaparin 30 mg every 24 hours   Renda Rolls, PharmD, [facility] 11/19/2022 2:56 AM

## 2022-11-19 NOTE — Assessment & Plan Note (Addendum)
Continue Aricept.  Seroquel only at night if agitated or having trouble sleeping.

## 2022-11-19 NOTE — Progress Notes (Signed)
Mobility Specialist - Progress Note   11/19/22 1054  Mobility  Activity Ambulated with assistance in room;Transferred from bed to chair  Level of Assistance Standby assist, set-up cues, supervision of patient - no hands on  Assistive Device Front wheel walker  Distance Ambulated (ft) 12 ft  Activity Response Tolerated well  $Mobility charge 1 Mobility   Pt supine upon entry, utilizing RA. Pt completed bed mob MinA for BLE, HHA for trunk support-- able to self support trunk once EOB. Pt STS to RW MinA-SBA and amb SBA. Pt took forward steps towards the end of the bed, then taking several backward steps toward to the recliner. Pt required min VC for hand placement when transferring to the recliner. Pt left seated with needs within reach, family member present at bedside. RN notified.  Candie Mile Mobility Specialist 11/19/22 10:59 AM

## 2022-11-20 DIAGNOSIS — N1831 Chronic kidney disease, stage 3a: Secondary | ICD-10-CM | POA: Diagnosis not present

## 2022-11-20 DIAGNOSIS — D472 Monoclonal gammopathy: Secondary | ICD-10-CM | POA: Diagnosis not present

## 2022-11-20 DIAGNOSIS — R350 Frequency of micturition: Secondary | ICD-10-CM | POA: Diagnosis present

## 2022-11-20 DIAGNOSIS — R531 Weakness: Secondary | ICD-10-CM | POA: Diagnosis present

## 2022-11-20 DIAGNOSIS — G309 Alzheimer's disease, unspecified: Secondary | ICD-10-CM

## 2022-11-20 DIAGNOSIS — Z961 Presence of intraocular lens: Secondary | ICD-10-CM | POA: Diagnosis present

## 2022-11-20 DIAGNOSIS — F0280 Dementia in other diseases classified elsewhere without behavioral disturbance: Secondary | ICD-10-CM | POA: Diagnosis present

## 2022-11-20 DIAGNOSIS — N401 Enlarged prostate with lower urinary tract symptoms: Secondary | ICD-10-CM | POA: Diagnosis present

## 2022-11-20 DIAGNOSIS — E663 Overweight: Secondary | ICD-10-CM | POA: Insufficient documentation

## 2022-11-20 DIAGNOSIS — E11319 Type 2 diabetes mellitus with unspecified diabetic retinopathy without macular edema: Secondary | ICD-10-CM | POA: Diagnosis present

## 2022-11-20 DIAGNOSIS — C9141 Hairy cell leukemia, in remission: Secondary | ICD-10-CM | POA: Diagnosis present

## 2022-11-20 DIAGNOSIS — C9000 Multiple myeloma not having achieved remission: Secondary | ICD-10-CM | POA: Diagnosis present

## 2022-11-20 DIAGNOSIS — Z794 Long term (current) use of insulin: Secondary | ICD-10-CM | POA: Diagnosis not present

## 2022-11-20 DIAGNOSIS — D61818 Other pancytopenia: Secondary | ICD-10-CM | POA: Diagnosis present

## 2022-11-20 DIAGNOSIS — D631 Anemia in chronic kidney disease: Secondary | ICD-10-CM | POA: Diagnosis present

## 2022-11-20 DIAGNOSIS — N170 Acute kidney failure with tubular necrosis: Secondary | ICD-10-CM | POA: Diagnosis not present

## 2022-11-20 DIAGNOSIS — Z7984 Long term (current) use of oral hypoglycemic drugs: Secondary | ICD-10-CM | POA: Diagnosis not present

## 2022-11-20 DIAGNOSIS — D649 Anemia, unspecified: Secondary | ICD-10-CM | POA: Diagnosis not present

## 2022-11-20 DIAGNOSIS — N178 Other acute kidney failure: Secondary | ICD-10-CM

## 2022-11-20 DIAGNOSIS — F05 Delirium due to known physiological condition: Secondary | ICD-10-CM | POA: Diagnosis present

## 2022-11-20 DIAGNOSIS — E1122 Type 2 diabetes mellitus with diabetic chronic kidney disease: Secondary | ICD-10-CM | POA: Diagnosis present

## 2022-11-20 DIAGNOSIS — K219 Gastro-esophageal reflux disease without esophagitis: Secondary | ICD-10-CM | POA: Diagnosis present

## 2022-11-20 DIAGNOSIS — I129 Hypertensive chronic kidney disease with stage 1 through stage 4 chronic kidney disease, or unspecified chronic kidney disease: Secondary | ICD-10-CM | POA: Diagnosis present

## 2022-11-20 DIAGNOSIS — E872 Acidosis, unspecified: Secondary | ICD-10-CM | POA: Diagnosis present

## 2022-11-20 DIAGNOSIS — K5900 Constipation, unspecified: Secondary | ICD-10-CM | POA: Diagnosis present

## 2022-11-20 DIAGNOSIS — Z66 Do not resuscitate: Secondary | ICD-10-CM | POA: Diagnosis present

## 2022-11-20 DIAGNOSIS — E785 Hyperlipidemia, unspecified: Secondary | ICD-10-CM | POA: Diagnosis present

## 2022-11-20 DIAGNOSIS — Z8616 Personal history of COVID-19: Secondary | ICD-10-CM | POA: Diagnosis not present

## 2022-11-20 DIAGNOSIS — N179 Acute kidney failure, unspecified: Secondary | ICD-10-CM | POA: Diagnosis present

## 2022-11-20 LAB — BASIC METABOLIC PANEL
Anion gap: 5 (ref 5–15)
BUN: 28 mg/dL — ABNORMAL HIGH (ref 8–23)
CO2: 23 mmol/L (ref 22–32)
Calcium: 8.2 mg/dL — ABNORMAL LOW (ref 8.9–10.3)
Chloride: 107 mmol/L (ref 98–111)
Creatinine, Ser: 2.3 mg/dL — ABNORMAL HIGH (ref 0.61–1.24)
GFR, Estimated: 27 mL/min — ABNORMAL LOW (ref >60)
Glucose, Bld: 189 mg/dL — ABNORMAL HIGH (ref 70–99)
Potassium: 4.3 mmol/L (ref 3.5–5.1)
Sodium: 135 mmol/L (ref 135–145)

## 2022-11-20 LAB — GLUCOSE, CAPILLARY
Glucose-Capillary: 174 mg/dL — ABNORMAL HIGH (ref 70–99)
Glucose-Capillary: 292 mg/dL — ABNORMAL HIGH (ref 70–99)
Glucose-Capillary: 98 mg/dL (ref 70–99)
Glucose-Capillary: 197 mg/dL — ABNORMAL HIGH (ref 70–99)

## 2022-11-20 LAB — URINE CULTURE: Culture: NO GROWTH

## 2022-11-20 LAB — HEMOGLOBIN A1C
Hgb A1c MFr Bld: 6.5 % — ABNORMAL HIGH (ref 4.8–5.6)
Mean Plasma Glucose: 140 mg/dL

## 2022-11-20 LAB — MAGNESIUM: Magnesium: 2.1 mg/dL (ref 1.7–2.4)

## 2022-11-20 MED ORDER — LACTULOSE 10 GM/15ML PO SOLN
20.0000 g | Freq: Once | ORAL | Status: AC
  Administered 2022-11-20: 20 g via ORAL
  Filled 2022-11-20: qty 30

## 2022-11-20 MED ORDER — SODIUM CHLORIDE 0.9 % IV SOLN: INTRAVENOUS | Status: DC

## 2022-11-20 NOTE — Progress Notes (Signed)
Mobility Specialist - Progress Note   11/20/22 1107  Mobility  Activity Ambulated with assistance in hallway  Level of Assistance Standby assist, set-up cues, supervision of patient - no hands on  Assistive Device Four wheel walker  Distance Ambulated (ft) 180 ft  Activity Response Tolerated well  $Mobility charge 1 Mobility   Pt in simi- fowler position upon entry, utilizing RA. Pt completed bed mob MinA for BLE and to bring trunk upright-- able to self support once seated EOB. Pt STS to RW and amb MinA to SBA. Pt amb one lap around NS, tolerated well. Pt required min VC to navigate RW-- pt would often drift to the left while amb. Pt denied any dizziness or SOB throughout activity, however stated feeling "tired" after amb. Pt returned to the room, left seated in recliner with alarm set and needs within reach. RN notified. Family member present at bedside.   Candie Mile Mobility Specialist 11/20/22 11:23 AM

## 2022-11-20 NOTE — Evaluation (Signed)
Physical Therapy Evaluation Patient Details Name: Alex Smith MRN: [id number] DOB: 1934-10-10 Today's Date: 11/20/2022  History of Present Illness  Alex Smith is a 88 y.o. male with medical history significant for HTN, smoldering myeloma, hairy cell leukemia, MDS on Luspatercept for anemia, followed by Dr. Janese Banks of oncology, as well as insulin-dependent type 2 diabetes, BPH, CKD 3A, Alzheimer's dementia, HTN, who was sent to the ED from SNF for evaluation of weakness and dysuria and concern for UTI.  Patient has had no fever or chills or vomiting or diarrhea.  Clinical Impression  Pt in recliner on entry with DTR at bedside. Pt able to perform entirety of mobility in session without physical assistance, however he does struggle to generate high power during these activities which limits his speed and amplitude. Pt is assumed to be near his most recent baseline, however he reports having more weakness and imbalance over the past 1-2 weeks. He expresses confidence in immediate safe return to facility at [facility] and agrees that PT services there would help maximize his safety, independence, and mobility.      Recommendations for follow up therapy are one component of a multi-disciplinary discharge planning process, led by the attending physician.  Recommendations may be updated based on patient status, additional functional criteria and insurance authorization.  Follow Up Recommendations       Assistance Recommended at Discharge Intermittent Supervision/Assistance  Patient can return home with the following  A little help with walking and/or transfers;A little help with bathing/dressing/bathroom    Equipment Recommendations None recommended by PT  Recommendations for Other Services       Functional Status Assessment Patient has had a recent decline in their functional status and demonstrates the ability to make significant improvements in function in a reasonable and predictable amount of time.      Precautions / Restrictions        Mobility  Bed Mobility Overal bed mobility: Needs Assistance Bed Mobility: Sit to Supine       Sit to supine: Supervision   General bed mobility comments: labored but successful    Transfers Overall transfer level: Needs assistance Equipment used: Rolling walker (2 wheels) Transfers: Sit to/from Stand Sit to Stand: Supervision           General transfer comment: 4x from recliner, better each time    Ambulation/Gait Ambulation/Gait assistance: Min guard Gait Distance (Feet): 100 Feet (recently finished AMB >251ft earlier today) Assistive device: Rolling walker (2 wheels) Gait Pattern/deviations: Step-to pattern       General Gait Details: slow, segmented movements, heavy UE support requirements on RW, no LOB  Stairs            Wheelchair Mobility    Modified Rankin (Stroke Patients Only)       Balance                                             Pertinent Vitals/Pain Pain Assessment Pain Assessment: No/denies pain    Home Living Family/patient expects to be discharged to:: Assisted living                 Home Equipment: Rollator (4 wheels) Additional Comments: [facility]    Prior Function Prior Level of Function : Needs assist       Physical Assist : ADLs (physical)  Hand Dominance        Extremity/Trunk Assessment   Upper Extremity Assessment Upper Extremity Assessment: Generalized weakness;Overall [facility] for tasks assessed    Lower Extremity Assessment Lower Extremity Assessment: Generalized weakness;Overall WFL for tasks assessed       Communication      Cognition Arousal/Alertness: Awake/alert Behavior During Therapy: WFL for tasks assessed/performed Overall Cognitive Status: History of cognitive impairments - at baseline                                 General Comments: delayed response at times, some  mild difficulty with expressive language        General Comments      Exercises     Assessment/Plan    PT Assessment Patient needs continued PT services  PT Problem List Decreased strength;Decreased range of motion;Decreased activity tolerance;Decreased balance;Decreased mobility;Decreased cognition;Decreased safety awareness;Decreased knowledge of precautions       PT Treatment Interventions DME instruction;Gait training;Functional mobility training;Therapeutic activities;Therapeutic exercise;Balance training;Patient/family education    PT Goals (Current goals can be found in the Care Plan section)  Acute Rehab PT Goals Patient Stated Goal: regain mobility and avoid loss of function PT Goal Formulation: With patient Time For Goal Achievement: 12/04/22 Potential to Achieve Goals: Good    Frequency Min 2X/week     Co-evaluation               AM-PAC PT "6 Clicks" Mobility  Outcome Measure Help needed turning from your back to your side while in a flat bed without using bedrails?: A Little Help needed moving from lying on your back to sitting on the side of a flat bed without using bedrails?: A Little Help needed moving to and from a bed to a chair (including a wheelchair)?: A Little Help needed standing up from a chair using your arms (e.g., wheelchair or bedside chair)?: A Little Help needed to walk in hospital room?: A Little Help needed climbing 3-5 steps with a railing? : A Little 6 Click Score: 18    End of Session Equipment Utilized During Treatment: Gait belt Activity Tolerance: Patient tolerated treatment well;No increased pain Patient left: in bed;with family/visitor present;with call bell/phone within reach;with nursing/sitter in room Nurse Communication: Mobility status PT Visit Diagnosis: Unsteadiness on feet (R26.81);Difficulty in walking, not elsewhere classified (R26.2);Other abnormalities of gait and mobility (R26.89)    Time: 1123-1140 PT Time  Calculation (min) (ACUTE ONLY): 17 min   Charges:   PT Evaluation $PT Eval Low Complexity: 1 Low         12:25 PM, 11/20/22 Etta Grandchild, PT, DPT Physical Therapist - [facility]  [phone] ([place])    [place] C 11/20/2022, 12:22 PM

## 2022-11-20 NOTE — Hospital Course (Addendum)
Alex Smith is a 88 y.o. male with medical history significant for HTN, smoldering myeloma, hairy cell leukemia, MDS on Luspatercept for anemia, followed by Dr. Smith Robert of oncology, as well as insulin-dependent type 2 diabetes, BPH, CKD 3A, Alzheimer's dementia, HTN, who was sent to the ED from ALF for evaluation of weakness and dysuria and concern for UTI.  Patient has been constipated, nausea, reduced liquid intake, he has acute on chronic renal failure.  Patient renal function does not improve after giving fluids, more anemic.  Consult obtained from oncology as well as nephrology. 4/8.  Hemoglobin dropped down to 6.9, given 1 unit PRBC.  Renal function improved after fluids.

## 2022-11-20 NOTE — Progress Notes (Signed)
  Progress Note   Patient: Alex Smith [id number] DOB: 04/11/1935 DOA: 11/19/2022     0 DOS: the patient was seen and examined on 11/20/2022   Brief hospital course: Alex Smith is a 88 y.o. male with medical history significant for HTN, smoldering myeloma, hairy cell leukemia, MDS on Luspatercept for anemia, followed by Dr. Janese Smith of oncology, as well as insulin-dependent type 2 diabetes, BPH, CKD 3A, Alzheimer's dementia, HTN, who was sent to the ED from ALF for evaluation of weakness and dysuria and concern for UTI.  Patient has been constipated, nausea, reduced liquid intake, he has acute on chronic renal failure.    Principal Problem:   Generalized weakness Active Problems:   Acute renal failure superimposed on stage 3a chronic kidney disease   BPH (benign prostatic hyperplasia)   Alzheimer's dementia   Essential hypertension   Type 2 diabetes mellitus with stage 3 chronic kidney disease   Hairy cell leukemia, in remission   Monoclonal gammopathy   Smoldering myeloma   Overweight (BMI 25.0-29.9)   Assessment and Plan: * Generalized weakness Physical deconditioning Patient still has some weakness, but seems to been doing better with physical therapy. Probably can go home tomorrow with PT and OT. Patient has no evidence of urinary tract infection.  Acute renal failure superimposed on stage 3a chronic kidney disease Creatinine 2.27, up from baseline of 1.45 Appears to be secondary to dehydration.  Will give a liter of fluids, recheck renal function tomorrow.  Bladder scan did not show any residual.  BPH (benign prostatic hyperplasia) Continue tamsulosin  Alzheimer's dementia Dementia precautions  Hairy cell leukemia, in remission Smoldering myeloma MDS Follow-up with oncology as outpatient.  Type 2 diabetes mellitus with stage 3 chronic kidney disease Sliding scale insulin coverage  Essential hypertension Continue amlodipine       Subjective:  Patient  still has significant weakness, no confusion.  Denies any dysuria or hematuria, no fever or chills.  Physical Exam: Vitals:   11/19/22 2034 11/20/22 0648 11/20/22 0823 11/20/22 0829  BP: (!) 170/66 (!) 181/75 (!) 166/71 (!) 144/76  Pulse: 69 87 89 74  Resp: 16 18 (!) 21   Temp: 98.6 F (37 C) 98.9 F (37.2 C) 98.7 F (37.1 C)   TempSrc: Oral  Oral   SpO2: 95% 96% 96% 98%  Weight:      Height:       General exam: Appears calm and comfortable  Respiratory system: Clear to auscultation. Respiratory effort normal. Cardiovascular system: S1 & S2 heard, RRR. No JVD, murmurs, rubs, gallops or clicks. No pedal edema. Gastrointestinal system: Abdomen is nondistended, soft and nontender. No organomegaly or masses felt. Normal bowel sounds heard. Central nervous system: Alert and oriented. No focal neurological deficits. Extremities: Symmetric 5 x 5 power. Skin: No rashes, lesions or ulcers Psychiatry: Judgement and insight appear normal. Mood & affect appropriate.    Data Reviewed:  Lab results reviewed.  Family Communication: Daughter updated at the bedside.  Disposition: Status is: Inpatient Remains inpatient appropriate because: Severity of disease, IV fluids.     Time spent: 35 minutes  Author: Sharen Hones, MD 11/20/2022 1:06 PM  For on call review [email or web address].

## 2022-11-21 DIAGNOSIS — D61818 Other pancytopenia: Secondary | ICD-10-CM | POA: Insufficient documentation

## 2022-11-21 DIAGNOSIS — E872 Acidosis, unspecified: Secondary | ICD-10-CM | POA: Insufficient documentation

## 2022-11-21 DIAGNOSIS — D472 Monoclonal gammopathy: Secondary | ICD-10-CM

## 2022-11-21 DIAGNOSIS — N178 Other acute kidney failure: Secondary | ICD-10-CM | POA: Diagnosis not present

## 2022-11-21 DIAGNOSIS — R531 Weakness: Secondary | ICD-10-CM | POA: Diagnosis not present

## 2022-11-21 DIAGNOSIS — N1831 Chronic kidney disease, stage 3a: Secondary | ICD-10-CM | POA: Diagnosis not present

## 2022-11-21 LAB — GLUCOSE, CAPILLARY
Glucose-Capillary: 201 mg/dL — ABNORMAL HIGH (ref 70–99)
Glucose-Capillary: 274 mg/dL — ABNORMAL HIGH (ref 70–99)
Glucose-Capillary: 274 mg/dL — ABNORMAL HIGH (ref 70–99)
Glucose-Capillary: 190 mg/dL — ABNORMAL HIGH (ref 70–99)

## 2022-11-21 LAB — HEPATIC FUNCTION PANEL
ALT: 24 U/L (ref 0–44)
AST: 41 U/L (ref 15–41)
Albumin: 2.9 g/dL — ABNORMAL LOW (ref 3.5–5.0)
Alkaline Phosphatase: 72 U/L (ref 38–126)
Bilirubin, Direct: <0.1 mg/dL (ref 0.0–0.2)
Total Bilirubin: 0.8 mg/dL (ref 0.3–1.2)
Total Protein: 7.2 g/dL (ref 6.5–8.1)

## 2022-11-21 LAB — CBC
HCT: 25.1 % — ABNORMAL LOW (ref 39.0–52.0)
Hemoglobin: 7.9 g/dL — ABNORMAL LOW (ref 13.0–17.0)
MCH: 31.3 pg (ref 26.0–34.0)
MCHC: 31.5 g/dL (ref 30.0–36.0)
MCV: 99.6 fL (ref 80.0–100.0)
Platelets: 104 10*3/uL — ABNORMAL LOW (ref 150–400)
RBC: 2.52 MIL/uL — ABNORMAL LOW (ref 4.22–5.81)
RDW: 16.2 % — ABNORMAL HIGH (ref 11.5–15.5)
WBC: 5 10*3/uL (ref 4.0–10.5)
nRBC: 0 % (ref 0.0–0.2)

## 2022-11-21 LAB — IRON AND TIBC
Iron: 41 ug/dL — ABNORMAL LOW (ref 45–182)
Saturation Ratios: 18 % (ref 17.9–39.5)
TIBC: 232 ug/dL — ABNORMAL LOW (ref 250–450)
UIBC: 191 ug/dL

## 2022-11-21 LAB — BASIC METABOLIC PANEL
Anion gap: 7 (ref 5–15)
BUN: 33 mg/dL — ABNORMAL HIGH (ref 8–23)
CO2: 21 mmol/L — ABNORMAL LOW (ref 22–32)
Calcium: 8 mg/dL — ABNORMAL LOW (ref 8.9–10.3)
Chloride: 107 mmol/L (ref 98–111)
Creatinine, Ser: 2.37 mg/dL — ABNORMAL HIGH (ref 0.61–1.24)
GFR, Estimated: 26 mL/min — ABNORMAL LOW (ref >60)
Glucose, Bld: 186 mg/dL — ABNORMAL HIGH (ref 70–99)
Potassium: 4.2 mmol/L (ref 3.5–5.1)
Sodium: 135 mmol/L (ref 135–145)

## 2022-11-21 LAB — VITAMIN B12: Vitamin B-12: 816 pg/mL (ref 180–914)

## 2022-11-21 LAB — FERRITIN: Ferritin: 237 ng/mL (ref 24–336)

## 2022-11-21 LAB — MAGNESIUM: Magnesium: 2.1 mg/dL (ref 1.7–2.4)

## 2022-11-21 LAB — LACTATE DEHYDROGENASE: LDH: 255 U/L — ABNORMAL HIGH (ref 98–192)

## 2022-11-21 MED ORDER — SODIUM BICARBONATE 650 MG PO TABS
650.0000 mg | ORAL_TABLET | Freq: Two times a day (BID) | ORAL | Status: DC
  Administered 2022-11-21 – 2022-11-22 (×3): 650 mg via ORAL
  Filled 2022-11-21 (×3): qty 1

## 2022-11-21 NOTE — Progress Notes (Signed)
  Progress Note   Patient: Alex Smith [id number] DOB: [birth year] DOA: 11/19/2022     1 DOS: the patient was seen and examined on 11/21/2022   Brief hospital course: Alex Smith is a 88 y.o. male with medical history significant for HTN, smoldering myeloma, hairy cell leukemia, MDS on Luspatercept for anemia, followed by Dr. Janese Banks of oncology, as well as insulin-dependent type 2 diabetes, BPH, CKD 3A, Alzheimer's dementia, HTN, who was sent to the ED from ALF for evaluation of weakness and dysuria and concern for UTI.  Patient has been constipated, nausea, reduced liquid intake, he has acute on chronic renal failure.  Patient renal function does not improve after giving fluids, more anemic.  Consult obtained from oncology as well as nephrology.   Principal Problem:   Generalized weakness Active Problems:   Acute renal failure superimposed on stage 3a chronic kidney disease   BPH (benign prostatic hyperplasia)   Alzheimer's dementia   Essential hypertension   Type 2 diabetes mellitus with stage 3 chronic kidney disease   Hairy cell leukemia, in remission   Monoclonal gammopathy   Smoldering myeloma   Overweight (BMI 25.0-29.9)   Pancytopenia   Metabolic acidosis   Assessment and Plan:   Generalized weakness Physical deconditioning Patient still has some weakness, but seems to been doing better with physical therapy. Probably can go home tomorrow with PT and OT. Patient has no evidence of urinary tract infection.   Acute renal failure superimposed on stage 3a chronic kidney disease Patient renal function did not improve after giving IV fluids.  Does not have urinary retention.  Will obtain nephrology consult today.   BPH (benign prostatic hyperplasia) Continue tamsulosin   Alzheimer's dementia Dementia precautions   Hairy cell leukemia, in remission Smoldering myeloma MDS Pancytopenia with anemia and thrombocytopenia. Patient developed worsening anemia with  thrombocytopenia.  Concerning for worsening MDS, obtain consult from hematology oncology.   Type 2 diabetes mellitus with stage 3 chronic kidney disease Sliding scale insulin coverage   Essential hypertension Continue amlodipine       Subjective:  Patient feels better today, feels stronger, good appetite.  Physical Exam: Vitals:   11/20/22 0829 11/20/22 2050 11/21/22 0533 11/21/22 0731  BP: (!) 144/76 (!) 174/85 (!) 124/55 (!) 154/71  Pulse: 74 (!) 42 85 86  Resp:  16 18 18   Temp:  98.7 F (37.1 C) 98.5 F (36.9 C) 98 F (36.7 C)  TempSrc:    Oral  SpO2: 98% 96% 94% 94%  Weight:      Height:       General exam: Appears calm and comfortable  Respiratory system: Clear to auscultation. Respiratory effort normal. Cardiovascular system: S1 & S2 heard, RRR. No JVD, murmurs, rubs, gallops or clicks. No pedal edema. Gastrointestinal system: Abdomen is nondistended, soft and nontender. No organomegaly or masses felt. Normal bowel sounds heard. Central nervous system: Alert and oriented. No focal neurological deficits. Extremities: Symmetric 5 x 5 power. Skin: No rashes, lesions or ulcers Psychiatry: Judgement and insight appear normal. Mood & affect appropriate.    Data Reviewed:  Lab results reviewed.  Family Communication: daughter updated  Disposition: Status is: Inpatient Remains inpatient appropriate because: Severity of disease,     Time spent: 35 minutes  Author: Sharen Hones, MD 11/21/2022 1:23 PM  For on call review [email or web address].

## 2022-11-21 NOTE — Progress Notes (Signed)
Physical Therapy Treatment Patient Details Name: Alex Smith MRN: [id number] DOB: [birth year] Today's Date: 11/21/2022   History of Present Illness Alex Smith is a 88 y.o. male with medical history significant for HTN, smoldering myeloma, hairy cell leukemia, MDS on Luspatercept for anemia, followed by Dr. Janese Banks of oncology, as well as insulin-dependent type 2 diabetes, BPH, CKD 3A, Alzheimer's dementia, HTN, who was sent to the ED from SNF for evaluation of weakness and dysuria and concern for UTI.  Patient has had no fever or chills or vomiting or diarrhea.    PT Comments    Pt in bed ready for PT session on author's 3rd attempt. No physical assistance needed in session, pt performs supine to sitting and stands 10x on his own. Pt AMB>256ft c RW at a constant and steady pace. Pt partakes in balance interventions at bedside at [facility]. Pt progressing well in mobility, but still likely to be here due to other medical problems being addressed. Son Dquan in room for session, attends our walk.     Recommendations for follow up therapy are one component of a multi-disciplinary discharge planning process, led by the attending physician.  Recommendations may be updated based on patient status, additional functional criteria and insurance authorization.  Follow Up Recommendations       Assistance Recommended at Discharge Intermittent Supervision/Assistance  Patient can return home with the following A little help with walking and/or transfers;A little help with bathing/dressing/bathroom   Equipment Recommendations  None recommended by PT    Recommendations for Other Services       Precautions / Restrictions       Mobility  Bed Mobility Overal bed mobility: Needs Assistance Bed Mobility: Sit to Supine       Sit to supine: Supervision        Transfers Overall transfer level: Needs assistance Equipment used: Rolling walker (2 wheels) Transfers: Sit to/from Stand Sit to Stand:  Supervision                Ambulation/Gait   Gait Distance (Feet): 240 Feet Assistive device: Rolling walker (2 wheels) Gait Pattern/deviations: WFL(Within Functional Limits), Step-through pattern Gait velocity: 0.37m/s     General Gait Details: continuous movement; improved,but still mildly off baseline per patient   Stairs             Wheelchair Mobility    Modified Rankin (Stroke Patients Only)       Balance                                            Cognition Arousal/Alertness: Awake/alert Behavior During Therapy: WFL for tasks assessed/performed Overall Cognitive Status: Within Functional Limits for tasks assessed                                          Exercises Other Exercises Other Exercises: STS x5 from elevated HOB Other Exercises: STS x8 from recliner Other Exercises: static stance balance with pillow toss/catch to son 64ft away x12    General Comments        Pertinent Vitals/Pain Pain Assessment Pain Assessment: No/denies pain    Home Living  Prior Function            PT Goals (current goals can now be found in the care plan section) Acute Rehab PT Goals Patient Stated Goal: regain mobility and avoid loss of function PT Goal Formulation: With patient Time For Goal Achievement: 12/04/22 Potential to Achieve Goals: Good Progress towards PT goals: Progressing toward goals    Frequency    Min 2X/week      PT Plan Current plan remains appropriate    Co-evaluation              AM-PAC PT "6 Clicks" Mobility   Outcome Measure  Help needed turning from your back to your side while in a flat bed without using bedrails?: A Little Help needed moving from lying on your back to sitting on the side of a flat bed without using bedrails?: A Little Help needed moving to and from a bed to a chair (including a wheelchair)?: A Little Help needed standing up  from a chair using your arms (e.g., wheelchair or bedside chair)?: A Little Help needed to walk in hospital room?: A Little Help needed climbing 3-5 steps with a railing? : A Little 6 Click Score: 18    End of Session Equipment Utilized During Treatment: Gait belt Activity Tolerance: Patient tolerated treatment well;No increased pain Patient left: in bed;with family/visitor present;with call bell/phone within reach;with nursing/sitter in room Nurse Communication: Mobility status PT Visit Diagnosis: Unsteadiness on feet (R26.81);Difficulty in walking, not elsewhere classified (R26.2);Other abnormalities of gait and mobility (R26.89)     Time: 1040-1103 PT Time Calculation (min) (ACUTE ONLY): 23 min  Charges:  $Therapeutic Exercise: 8-22 mins $Neuromuscular Re-education: 8-22 mins                    12:11 PM, 11/21/22 Etta Grandchild, PT, DPT Physical Therapist - [facility]  [phone] ([place])    [place] C 11/21/2022, 12:09 PM

## 2022-11-21 NOTE — Consult Note (Signed)
[facility]  CONSULT NOTE    Date: 11/21/2022                  Patient Name:  JOBY WINT  MRN: [id number]  DOB: May 26, 1935  Age / Sex: 88 y.o., male         PCP: [facility]                 Service Requesting Consult: Dr. Roosevelt Locks                 Reason for Consult: Acute kidney Injury            History of Present Illness: Mr. MANFORD ECKHARDT presents to [facility] weakness, dysuria and concerns for urinary tract infection. Patient currently lives in a assisted living facility. Patient has dementia and history taken from son who is at bedside.   Patient's urinalysis was found to be without pyuria. Started on IV fluids which seems to have improved clinical presentation.   Seems patient's oral intake had decreased. No recent changes to medications. No obstruction on renal ultrasound. No IV contrast exposure. Denies chronic use of nonsteroidal anti-inflammatory agents.   Patient does not follow with nephrology outpatient.    Medications: Outpatient medications: Medications Prior to Admission  Medication Sig Dispense Refill Last Dose   ARICEPT 5 MG tablet Take 5 mg by mouth daily.      Dextromethorphan-guaiFENesin (MUCINEX DM) 30-600 MG TB12 Take 2 tablets by mouth 2 (two) times daily.      acetaminophen (TYLENOL) 500 MG tablet Take 1,000 mg by mouth 2 (two) times daily as needed for moderate pain.      albuterol (VENTOLIN HFA) 108 (90 Base) MCG/ACT inhaler Inhale 2 puffs into the lungs every 6 (six) hours as needed for wheezing or shortness of breath.      alum & mag hydroxide-simeth (MAALOX/MYLANTA) 200-200-20 MG/5ML suspension Take 30 mLs by mouth every 4 (four) hours as needed for indigestion or heartburn. 355 mL 0    amLODipine (NORVASC) 10 MG tablet Take 1 tablet (10 mg total) by mouth daily.      aspirin EC 81 MG tablet Take 81 mg by mouth daily.      doxazosin (CARDURA) 1 MG tablet TAKE ONE TABLET BY MOUTH EVERY DAY (Patient not taking:  Reported on 08/27/2022) 30 tablet 5    famotidine (PEPCID) 20 MG tablet Take 20 mg by mouth daily.      feeding supplement (ENSURE ENLIVE / ENSURE PLUS) LIQD Take 237 mLs by mouth 2 (two) times daily between meals. (Patient not taking: Reported on 08/27/2022) 237 mL 12    insulin glargine (LANTUS) 100 UNIT/ML Solostar Pen Inject 15 Units into the skin at bedtime.      insulin lispro (HUMALOG) 100 UNIT/ML KwikPen Inject into the skin.      lisinopril (ZESTRIL) 5 MG tablet Take 5 mg by mouth daily.      melatonin 3 MG TABS tablet Take 3 mg by mouth at bedtime.      Multiple Vitamin (MULTIVITAMIN WITH MINERALS) TABS tablet Take 1 tablet by mouth daily.      pantoprazole (PROTONIX) 40 MG tablet Take 1 tablet (40 mg total) by mouth 2 (two) times daily.      pravastatin (PRAVACHOL) 20 MG tablet TAKE ONE TABLET EVERY EVENING 30 tablet 0    sertraline (ZOLOFT) 25 MG tablet Take 25 mg by mouth daily.      sitaGLIPtin (JANUVIA) 50  MG tablet Take 50 mg by mouth daily.      tamsulosin (FLOMAX) 0.4 MG CAPS capsule TAKE 1 CAPSULE BY MOUTH EVERY DAY 30 capsule 3    vitamin B-12 (CYANOCOBALAMIN) 1000 MCG tablet Take 1 tablet (1,000 mcg total) by mouth daily. 90 tablet 3    vitamin E 400 UNIT capsule Take 400 Units by mouth daily.       Current medications: Current Facility-Administered Medications  Medication Dose Route Frequency Provider Last Rate Last Admin   acetaminophen (TYLENOL) tablet 650 mg  650 mg Oral Q6H PRN Athena Masse, MD       Or   acetaminophen (TYLENOL) suppository 650 mg  650 mg Rectal Q6H PRN Athena Masse, MD       amLODipine (NORVASC) tablet 10 mg  10 mg Oral Daily Athena Masse, MD   10 mg at 11/21/22 [id number]   aspirin EC tablet 81 mg  81 mg Oral Daily Judd Gaudier V, MD   81 mg at 11/21/22 0852   enoxaparin (LOVENOX) injection 30 mg  30 mg Subcutaneous Q24H Judd Gaudier V, MD   30 mg at 11/21/22 0852   insulin aspart (novoLOG) injection 0-15 Units  0-15 Units Subcutaneous TID WC  Athena Masse, MD   8 Units at 11/21/22 1219   insulin aspart (novoLOG) injection 0-5 Units  0-5 Units Subcutaneous QHS Athena Masse, MD   2 Units at 11/19/22 2214   melatonin tablet 2.5 mg  2.5 mg Oral QHS Judd Gaudier V, MD   2.5 mg at 11/20/22 2241   ondansetron (ZOFRAN) tablet 4 mg  4 mg Oral Q6H PRN Athena Masse, MD       Or   ondansetron [facility]) injection 4 mg  4 mg Intravenous Q6H PRN Athena Masse, MD       pantoprazole (PROTONIX) EC tablet 40 mg  40 mg Oral BID Judd Gaudier V, MD   40 mg at 11/21/22 [id number]   pravastatin (PRAVACHOL) tablet 20 mg  20 mg Oral QPM Judd Gaudier V, MD   20 mg at 11/20/22 1726   sertraline (ZOLOFT) tablet 25 mg  25 mg Oral Daily Judd Gaudier V, MD   25 mg at 11/21/22 [id number]   sodium bicarbonate tablet 650 mg  650 mg Oral BID Sharen Hones, MD   650 mg at 11/21/22 0853   tamsulosin (FLOMAX) capsule 0.4 mg  0.4 mg Oral Daily Athena Masse, MD   0.4 mg at 11/21/22 [id number]   Facility-Administered Medications Ordered in Other Encounters  Medication Dose Route Frequency Provider Last Rate Last Admin   heparin lock flush 100 unit/mL  500 Units Intravenous Once Corcoran, Drue Second, MD       luspatercept-aamt (REBLOZYL) subcutaneous injection 100 mg  100 mg Subcutaneous Q21 days Sindy Guadeloupe, MD   100 mg at 08/27/22 1206   sodium chloride 0.9 % injection 10 mL  10 mL Intravenous PRN Nolon Stalls C, MD   10 mL at 03/03/15 0903   sodium chloride 0.9 % injection 10 mL  10 mL Intracatheter PRN Nolon Stalls C, MD   10 mL at 03/10/15 1410   sodium chloride flush (NS) 0.9 % injection 10 mL  10 mL Intravenous PRN Lequita Asal, MD   10 mL at 07/23/18 1517      Allergies: Allergies  Allergen Reactions   Rituximab Rash    Chest tightness Chest tightness   Blood-Group Specific Substance Other (See Comments)  Had a post transfusion reaction of red blood cells; NOW REQUIRES WASHED BLOOD CELLS Had a post transfusion reaction of red blood cells;  NOW REQUIRES WASHED BLOOD CELLS   Primaxin [Imipenem] Other (See Comments)    Possible allergy Tolerates cephalosporins   Voriconazole Other (See Comments)   Sulfa Antibiotics Itching and Rash   Sulfacetamide Sodium Itching and Rash      Past Medical History: Past Medical History:  Diagnosis Date   Alzheimer's dementia ([place]) 08/18/2021   Anemia    B12 deficiency    Basal cell carcinoma 03/30/2018   left ant nasal ala   Basal cell carcinoma of face 01/19/2013   right distal dorsum nose   BPH (benign prostatic hypertrophy)    followed by urology, discharged (Dr. 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 pretibial. 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 EDC 09/07/2020   Squamous cell carcinoma of skin 04/03/2020   Left cheek. WD SCC   Systolic murmur [id number]   Type 2 diabetes, controlled, with retinopathy ([place])      Past Surgical History: Past Surgical History:  Procedure Laterality Date   25 GAUGE PARS PLANA VITRECTOMY WITH 20 GAUGE MVR PORT FOR MACULAR HOLE  07/07/2012   Procedure: 25 GAUGE PARS PLANA VITRECTOMY WITH 20 GAUGE MVR PORT FOR MACULAR HOLE;  Surgeon: Hayden Pedro, MD;  Location: [place];  Service: Ophthalmology;  Laterality: Left;   BONE MARROW BIOPSY  2016   CARDIOVASCULAR STRESS TEST  2013   treadmill - no evidence ischemia, EF 61%   CATARACT EXTRACTION W/ INTRAOCULAR LENS  IMPLANT, BILATERAL  ~  2010   COLONOSCOPY  2014   [place] WNL no rpt needed, h/o polyps   EYE SURGERY Left 06/2012   laser surgery   GAS INSERTION  07/07/2012   Procedure: INSERTION OF GAS;  Surgeon: Hayden Pedro, MD;  Location: [place];  Service: Ophthalmology;  Laterality: Left;  C3F8   PERIPHERAL VASCULAR CATHETERIZATION N/A 02/23/2015   Procedure: Glori Luis Cath Insertion;  Surgeon: Algernon Huxley, MD;  Location: [place] [facility];  Service: Cardiovascular;  Laterality: N/A;   PORTA CATH REMOVAL N/A 02/16/2020   Procedure: PORTA CATH REMOVAL;  Surgeon: Algernon Huxley, MD;  Location: [place] [facility];  Service: Cardiovascular;  Laterality: N/A;   SERUM PATCH  07/07/2012   Procedure: SERUM PATCH;  Surgeon: Hayden Pedro, MD;  Location: [place];  Service: Ophthalmology;  Laterality: Left;   SKIN CANCER EXCISION     "all over my face" (07/07/2012)     Family History: Family History  Problem Relation Age of Onset   Dementia Mother    Heart failure Father 22   Cancer Sister        breast   Diabetes Paternal Uncle    Diabetes Paternal Aunt    CAD  Brother 30       MI   Stroke Neg Hx      Social History: Social History   Socioeconomic History   Marital status: Married    Spouse name: Not on file   Number of children: Not on file   Years of education: Not on file   Highest education level: Not on file  Occupational History   Not on file  Tobacco Use   Smoking status: Former    Packs/day: 2.00    Years: 25.00    Additional pack years: 0.00    Total pack years: 50.00    Types: Cigarettes    Quit date: 02/06/1969    Years since quitting: 53.8   Smokeless tobacco: Never   Tobacco comments:    07/07/2012 "stopped smoking ~ 40 yr ago; smoked 20-40yr"  Vaping Use   Vaping Use: Never used  Substance and Sexual Activity   Alcohol use: Not Currently   Drug use: No   Sexual activity: Never  Other Topics Concern   Not on file  Social History Narrative   Lives at home alone. 1 cat   Occupation: was  Art gallery manager, works part time for [employer]   Activity: likes to golf   Diet: moderate water, fruits/vegetables daily   Social Determinants of Radio broadcast assistant Strain: Not on file  Food Insecurity: Not on file  Transportation Needs: Not on file  Physical Activity: Not on file  Stress: Not on file  Social Connections: Not on file  Intimate Partner Violence: Not on file       Vital Signs: Blood pressure (!) 154/71, pulse 86, temperature 98 F (36.7 C), temperature source Oral, resp. rate 18, height 5\' 7"  (1.702 m), weight 86.2 kg, SpO2 94 %.  Weight trends: Filed Weights   11/18/22 2314  Weight: 86.2 kg    Physical Exam: General: NAD, laying in bed  Head: Normocephalic, atraumatic. Moist oral mucosal membranes  Eyes: Anicteric, PERRL  Neck: Supple, trachea midline  Lungs:  Clear to auscultation  Heart: Regular rate and rhythm  Abdomen:  Soft, nontender,   Extremities:  no peripheral edema.  Neurologic: Nonfocal, moving all four extremities  Skin: No lesions        Lab results: Basic Metabolic Panel: Recent Labs  Lab 11/19/22 0531 11/20/22 0838 11/21/22 0531  NA 139 135 135  K 3.7 4.3 4.2  CL 112* 107 107  CO2 22 23 21*  GLUCOSE 58* 189* 186*  BUN 26* 28* 33*  CREATININE 2.26* 2.30* 2.37*  CALCIUM 8.2* 8.2* 8.0*  MG  --  2.1 2.1    Liver Function Tests: Recent Labs  Lab 11/21/22 0932  AST 41  ALT 24  ALKPHOS 72  BILITOT 0.8  PROT 7.2  ALBUMIN 2.9*   No results for input(s): "LIPASE", "AMYLASE" in the last 168 hours. No results for input(s): "AMMONIA" in the last 168 hours.  CBC: Recent Labs  Lab 11/18/22 2318 11/21/22 0531  WBC 6.8 5.0  HGB 9.2* 7.9*  HCT 29.5* 25.1*  MCV 102.1* 99.6  PLT 120* 104*    Cardiac Enzymes: No results for input(s): "CKTOTAL", "CKMB", "CKMBINDEX", "TROPONINI" in the last 168 hours.  BNP: Invalid input(s): "POCBNP"  CBG: Recent Labs  Lab 11/20/22 1159 11/20/22 1721 11/20/22 2158  11/21/22 0732 11/21/22 1155  GLUCAP 292* 98 197* 201* 274*    Microbiology: Results for orders placed or performed during the hospital encounter of 11/19/22  Urine Culture (for  pregnant, neutropenic or urologic patients or patients with an indwelling urinary catheter)     Status: None   Collection Time: 11/19/22  1:11 AM   Specimen: Urine, Random  Result Value Ref Range Status   Specimen Description   Final    URINE, RANDOM Performed at [facility], [address], [place], [place] [address]    Special Requests   Final    NONE Performed at [facility], [address] [place], [place] [address]    Culture   Final    NO GROWTH Performed at [facility], [place] [address], [place], [place] [address]    Report Status 11/20/2022 FINAL  Final   *Note: Due to a large number of results and/or encounters for the requested time period, some results have not been displayed. A complete set of results can be found in Results Review.    Coagulation Studies: No results for input(s): "LABPROT", "INR" in the last 72 hours.  Urinalysis: Recent Labs    11/19/22 0111  COLORURINE YELLOW*  LABSPEC 1.020  PHURINE 5.0  GLUCOSEU NEGATIVE  HGBUR SMALL*  BILIRUBINUR NEGATIVE  KETONESUR NEGATIVE  PROTEINUR >=300*  NITRITE NEGATIVE  LEUKOCYTESUR SMALL*      Imaging: US RENAL  Result Date: 11/19/2022 CLINICAL DATA:  Acute on chronic renal insufficiency EXAM: RENAL / URINARY TRACT ULTRASOUND COMPLETE COMPARISON:  04/09/2022 FINDINGS: Right Kidney: Renal measurements: 10.2 x 6.1 x 4.8 cm = volume: 155.6 mL. Echogenicity within normal limits. Benign simple cortical cyst measuring 1.2 cm upper pole. No hydronephrosis or nephrolithiasis. Left Kidney: Renal measurements: 12.7 x 6.5 x 5.8 cm = volume: 240.9 mL. Echogenicity within normal limits. 1.1 cm simple cyst upper pole left kidney. The larger simple cysts seen posteriorly off the left kidney on prior CT is not well  visualized. No hydronephrosis or nephrolithiasis. Bladder: Appears normal for degree of bladder distention. Other: Incidental cholelithiasis.  No evidence of cholecystitis. IMPRESSION: 1. Unremarkable appearance of the bilateral kidneys. 2. Incidental cholelithiasis.  No evidence of cholecystitis. Electronically Signed   By: Randa Ngo M.D.   On: 11/19/2022 18:22     Assessment & Plan: Mr. KAMAAL RAYNOR is a 88 y.o. white male with alzheimer's dementia, BPH, Hairy cell leukemia, hypertension, hyperlipidemia, diabetes mellitus type II, diabetic retinopathy, , who was admitted to [facility] on 11/19/2022 for Generalized weakness [R53.1] AKI (acute kidney injury) [N17.9] Acute renal failure superimposed on stage 3a chronic kidney disease [N17.9, N18.31]  Acute kidney injury: on chronic kidney disease stage IIIA with baseline creatinine of 1.45, GFR of 47 on 08/27/2022. Patient with history of hematuria, proteinuria and glycosuria. Consistent with diabetic nephropathy. Acute kidney injury most likely secondary to prerenal azotemia. Not on ACE-I/ARB, no diuretic, no SGLT-2 inhibitor as outpatient. No obstruction on ultrasound. No IV contrast exposure. No exposure to any other nephrotoxic agents.   Diabetes mellitus type II with chronic kidney disease and renal manifestations. Insulin dependent.   Hypertension with chronic kidney disease: 154/71 -on amlodipine and tamsulosin.   Anemia with chronic kidney disease, with history of smoldering myeloma and remission of Hairy cell leukemia. Followed by Dr. Janese Banks, [place]. Hemoglobin 7.9. with thrombocytopenia. Appreciate hematology input.    LOS: 1 Ambrea Hegler 4/4/20241:02 PM

## 2022-11-21 NOTE — Progress Notes (Signed)
Mobility Specialist - Progress Note   11/21/22 1418  Mobility  Activity Turned to right side;Turned to left side;Turned to back - supine  Level of Assistance Minimal assist, patient does 75% or more  Assistive Device None  Activity Response Tolerated well  $Mobility charge 1 Mobility   Pt supine upon entry, utilizing RA. Pt denied OOB amb this date, as he has worked with PT today. Pt required MinA to be repositioned in bed. Pt left supine with alarm set and needs within reach.   Candie Mile Mobility Specialist 11/21/22 2:22 PM

## 2022-11-22 DIAGNOSIS — D61818 Other pancytopenia: Secondary | ICD-10-CM

## 2022-11-22 DIAGNOSIS — N178 Other acute kidney failure: Secondary | ICD-10-CM | POA: Diagnosis not present

## 2022-11-22 DIAGNOSIS — N1831 Chronic kidney disease, stage 3a: Secondary | ICD-10-CM | POA: Diagnosis not present

## 2022-11-22 DIAGNOSIS — R531 Weakness: Secondary | ICD-10-CM | POA: Diagnosis not present

## 2022-11-22 LAB — BASIC METABOLIC PANEL
Anion gap: 6 (ref 5–15)
BUN: 37 mg/dL — ABNORMAL HIGH (ref 8–23)
CO2: 21 mmol/L — ABNORMAL LOW (ref 22–32)
Calcium: 8.1 mg/dL — ABNORMAL LOW (ref 8.9–10.3)
Chloride: 109 mmol/L (ref 98–111)
Creatinine, Ser: 2.51 mg/dL — ABNORMAL HIGH (ref 0.61–1.24)
GFR, Estimated: 24 mL/min — ABNORMAL LOW (ref >60)
Glucose, Bld: 289 mg/dL — ABNORMAL HIGH (ref 70–99)
Potassium: 4.3 mmol/L (ref 3.5–5.1)
Sodium: 136 mmol/L (ref 135–145)

## 2022-11-22 LAB — GLUCOSE, CAPILLARY
Glucose-Capillary: 272 mg/dL — ABNORMAL HIGH (ref 70–99)
Glucose-Capillary: 262 mg/dL — ABNORMAL HIGH (ref 70–99)
Glucose-Capillary: 194 mg/dL — ABNORMAL HIGH (ref 70–99)
Glucose-Capillary: 267 mg/dL — ABNORMAL HIGH (ref 70–99)

## 2022-11-22 LAB — CBC
HCT: 24 % — ABNORMAL LOW (ref 39.0–52.0)
Hemoglobin: 7.5 g/dL — ABNORMAL LOW (ref 13.0–17.0)
MCH: 31 pg (ref 26.0–34.0)
MCHC: 31.3 g/dL (ref 30.0–36.0)
MCV: 99.2 fL (ref 80.0–100.0)
Platelets: 104 10*3/uL — ABNORMAL LOW (ref 150–400)
RBC: 2.42 MIL/uL — ABNORMAL LOW (ref 4.22–5.81)
RDW: 15.9 % — ABNORMAL HIGH (ref 11.5–15.5)
WBC: 4.4 10*3/uL (ref 4.0–10.5)
nRBC: 0 % (ref 0.0–0.2)

## 2022-11-22 LAB — KAPPA/LAMBDA LIGHT CHAINS
Kappa free light chain: 76.4 mg/L — ABNORMAL HIGH (ref 3.3–19.4)
Kappa, lambda light chain ratio: 3.96 — ABNORMAL HIGH (ref 0.26–1.65)
Lambda free light chains: 19.3 mg/L (ref 5.7–26.3)

## 2022-11-22 LAB — HAPTOGLOBIN: Haptoglobin: 272 mg/dL (ref 38–329)

## 2022-11-22 MED ORDER — SODIUM CHLORIDE 0.45 % IV SOLN
INTRAVENOUS | Status: DC
  Filled 2022-11-22 (×4): qty 75

## 2022-11-22 NOTE — Progress Notes (Signed)
Mobility Specialist - Progress Note   11/22/22 1115  Mobility  Activity Ambulated with assistance in hallway;Transferred from bed to chair  Level of Assistance Standby assist, set-up cues, supervision of patient - no hands on  Assistive Device Front wheel walker  Distance Ambulated (ft) 180 ft  Activity Response Tolerated well  $Mobility charge 1 Mobility   Pt supine upon entry, utilizing RA. Pt completed bed mob and STS to RW MinA. Pt amb one lap around NS, tolerated well. Pt denied any dizziness, or SOB, however stated feeling "tired" about 140 ft into amb. Pt returned to room, one LOB upon sitting-- CGA to self-correct and redirect hips. Pt sat EOB for a seated rest break before amb to the recliner. Pt left in recliner with alarm set and needs within reach, family member present at bedside.   Zetta Bills Mobility Specialist 11/22/22 11:19 AM

## 2022-11-22 NOTE — Care Management Important Message (Signed)
Important Message  Patient Details  Name: Alex Smith MRN: [id number] Date of Birth: [birth year]   Medicare Important Message Given:  Yes     [name] 11/22/2022, 11:16 AM

## 2022-11-22 NOTE — Progress Notes (Signed)
[facility]  ROUNDING NOTE   Subjective:   Daughter at bedside.   Creatinine 2.51 (2.37)  Objective:  Vital signs in last 24 hours:  Temp:  [98.3 F (36.8 C)-99 F (37.2 C)] 98.3 F (36.8 C) (04/05 0751) Pulse Rate:  [85-92] 85 (04/05 0751) Resp:  [16-18] 18 (04/05 0751) BP: (139-175)/(64-84) 171/84 (04/05 0751) SpO2:  [95 %-97 %] 95 % (04/05 0751)  Weight change:  Filed Weights   11/18/22 2314  Weight: 86.2 kg    Intake/Output: I/O last 3 completed shifts: In: 360 [P.O.:360] Out: 650 [Urine:650]   Intake/Output this shift:  Total I/O In: 240 [P.O.:240] Out: -   Physical Exam: General: NAD, laying in bed  Head: Normocephalic, atraumatic. Moist oral mucosal membranes  Eyes: Anicteric, PERRL  Neck: Supple, trachea midline  Lungs:  Clear to auscultation  Heart: Regular rate and rhythm  Abdomen:  Soft, nontender,   Extremities:  no peripheral edema.  Neurologic: Nonfocal, moving all four extremities  Skin: No lesions       Basic Metabolic Panel: Recent Labs  Lab 11/18/22 2318 11/19/22 0531 11/20/22 0838 11/21/22 0531 11/22/22 0510  NA 138 139 135 135 136  K 3.8 3.7 4.3 4.2 4.3  CL 108 112* 107 107 109  CO2 21* 22 23 21* 21*  GLUCOSE 82 58* 189* 186* 289*  BUN 29* 26* 28* 33* 37*  CREATININE 2.27* 2.26* 2.30* 2.37* 2.51*  CALCIUM 8.7* 8.2* 8.2* 8.0* 8.1*  MG  --   --  2.1 2.1  --     Liver Function Tests: Recent Labs  Lab 11/21/22 0932  AST 41  ALT 24  ALKPHOS 72  BILITOT 0.8  PROT 7.2  ALBUMIN 2.9*   No results for input(s): "LIPASE", "AMYLASE" in the last 168 hours. No results for input(s): "AMMONIA" in the last 168 hours.  CBC: Recent Labs  Lab 11/18/22 2318 11/21/22 0531 11/22/22 0510  WBC 6.8 5.0 4.4  HGB 9.2* 7.9* 7.5*  HCT 29.5* 25.1* 24.0*  MCV 102.1* 99.6 99.2  PLT 120* 104* 104*    Cardiac Enzymes: No results for input(s): "CKTOTAL", "CKMB", "CKMBINDEX", "TROPONINI" in the last 168  hours.  BNP: Invalid input(s): "POCBNP"  CBG: Recent Labs  Lab 11/21/22 0732 11/21/22 1155 11/21/22 1626 11/21/22 2150 11/22/22 0752  GLUCAP 201* 274* 274* 190* 272*    Microbiology: Results for orders placed or performed during the hospital encounter of 11/19/22  Urine Culture (for pregnant, neutropenic or urologic patients or patients with an indwelling urinary catheter)     Status: None   Collection Time: 11/19/22  1:11 AM   Specimen: Urine, Random  Result Value Ref Range Status   Specimen Description   Final    URINE, RANDOM Performed at [facility], [address], [place], [place] [address]    Special Requests   Final    NONE Performed at [facility], [address], [place], [place] [address]    Culture   Final    NO GROWTH Performed at [facility], [address] [place], [place] [address]    Report Status 11/20/2022 FINAL  Final   *Note: Due to a large number of results and/or encounters for the requested time period, some results have not been displayed. A complete set of results can be found in Results Review.    Coagulation Studies: No results for input(s): "LABPROT", "INR" in the last 72 hours.  Urinalysis: No results for input(s): "COLORURINE", "LABSPEC", "  PHURINE", "GLUCOSEU", "HGBUR", "BILIRUBINUR", "KETONESUR", "PROTEINUR", "UROBILINOGEN", "NITRITE", "LEUKOCYTESUR" in the last 72 hours.  Invalid input(s): "APPERANCEUR"    Imaging: No results found.   Medications:    sodium bicarbonate 75 mEq in sodium chloride 0.45 % 1,075 mL infusion      amLODipine  10 mg Oral Daily   aspirin EC  81 mg Oral Daily   enoxaparin (LOVENOX) injection  30 mg Subcutaneous Q24H   insulin aspart  0-15 Units Subcutaneous TID WC   insulin aspart  0-5 Units Subcutaneous QHS   melatonin  2.5 mg Oral QHS   pantoprazole  40 mg Oral BID   pravastatin  20 mg Oral QPM   sertraline  25 mg Oral Daily   tamsulosin  0.4 mg Oral Daily    acetaminophen **OR** acetaminophen, ondansetron **OR** ondansetron (ZOFRAN) IV  Assessment/ Plan:  Mr. Alex Alex is a 88 y.o.  male with Alzheimer's dementia, BPH, Hairy cell leukemia, hypertension, hyperlipidemia, diabetes mellitus type II, diabetic retinopathy, , who was admitted to [facility] on 11/19/2022 for Generalized weakness [R53.1] AKI (acute kidney injury) [N17.9] Acute renal failure superimposed on stage 3a chronic kidney disease [N17.9, N18.31]   Acute kidney injury: on chronic kidney disease stage IIIA with baseline creatinine of 1.45, GFR of 47 on 08/27/2022. Patient with history of hematuria, proteinuria and glycosuria. Consistent with diabetic nephropathy. Acute kidney injury most likely secondary to prerenal azotemia. Not on ACE-I/ARB, no diuretic, no SGLT-2 inhibitor as outpatient. No obstruction on ultrasound. No IV contrast exposure. No exposure to any other nephrotoxic agents. - Start IV fluids today: bicarb infusion at 83mL/hr    Diabetes mellitus type II with chronic kidney disease and renal manifestations. Insulin dependent. Not on SGLT-2 or ACE-I/ARB   Hypertension with chronic kidney disease: also takes doxazosin at home.  - on amlodipine and tamsulosin.    Anemia with chronic kidney disease, with history of smoldering myeloma and remission of Hairy cell leukemia. Followed by Dr. Smith Alex, [facility]. Hemoglobin 7.5. with thrombocytopenia. Appreciate hematology input.    LOS: 2 Alex Alex 4/5/202411:55 AM

## 2022-11-22 NOTE — Progress Notes (Addendum)
Progress Note   Patient: Alex Smith YWV:[id number] DOB: 06/29/1935 DOA: 11/19/2022     2 DOS: the patient was seen and examined on 11/22/2022   Brief hospital course: Alex Smith is a 88 y.o. male with medical history significant for HTN, smoldering myeloma, hairy cell leukemia, MDS on Luspatercept for anemia, followed by Dr. Smith Robert of oncology, as well as insulin-dependent type 2 diabetes, BPH, CKD 3A, Alzheimer's dementia, HTN, who was sent to the ED from ALF for evaluation of weakness and dysuria and concern for UTI.  Patient has been constipated, nausea, reduced liquid intake, he has acute on chronic renal failure.  Patient renal function does not improve after giving fluids, more anemic.  Consult obtained from oncology as well as nephrology.   Principal Problem:   Generalized weakness Active Problems:   Acute renal failure superimposed on stage 3a chronic kidney disease   BPH (benign prostatic hyperplasia)   Alzheimer's dementia   Essential hypertension   Type 2 diabetes mellitus with stage 3 chronic kidney disease   Hairy cell leukemia, in remission   Monoclonal gammopathy   Smoldering myeloma   Overweight (BMI 25.0-29.9)   Pancytopenia   Metabolic acidosis   Assessment and Plan: Acute renal failure superimposed on stage 3a chronic kidney disease Mild metabolic acidosis. Patient renal function still deteriorating, appreciate nephrology consult.  Will start IV fluids for 24 hours per recommendation from nephrology.  Due to mild metabolic acidosis, will use bicarb drip.   Hairy cell leukemia, in remission Smoldering myeloma MDS Pancytopenia with anemia and thrombocytopenia. Hemoglobin continue to decline, will be seen by oncology today.  Recheck a CBC tomorrow, transfuse as needed.   Generalized weakness Physical deconditioning Patient still has some weakness, but seems to been doing better with physical therapy. Probably can go home tomorrow with PT and OT. Patient  has no evidence of urinary tract infection.     BPH (benign prostatic hyperplasia) Continue tamsulosin   Alzheimer's dementia Dementia precautions    Type 2 diabetes mellitus with stage 3 chronic kidney disease Sliding scale insulin coverage   Essential hypertension Continue amlodipine  Code status: discussed with patient and daughter, patient is DNR status    Subjective:  Patient appears to be weaker today, otherwise no other complaints.  Physical Exam: Vitals:   11/21/22 1625 11/21/22 2002 11/22/22 0417 11/22/22 0751  BP: (!) 156/67 (!) 175/64 139/70 (!) 171/84  Pulse: 86 91 92 85  Resp: 18 16 16 18   Temp: 98.3 F (36.8 C) 99 F (37.2 C) 98.4 F (36.9 C) 98.3 F (36.8 C)  TempSrc:      SpO2: 95% 97% 95% 95%  Weight:      Height:       General exam: Appears calm and comfortable  Respiratory system: Clear to auscultation. Respiratory effort normal. Cardiovascular system: S1 & S2 heard, RRR. No JVD, murmurs, rubs, gallops or clicks. No pedal edema. Gastrointestinal system: Abdomen is nondistended, soft and nontender. No organomegaly or masses felt. Normal bowel sounds heard. Central nervous system: Alert and oriented. No focal neurological deficits. Extremities: Symmetric 5 x 5 power. Skin: No rashes, lesions or ulcers Psychiatry: Judgement and insight appear normal. Mood & affect appropriate.    Data Reviewed:  Lab results reviewed.  Family Communication: Daughter updated at the bedside.  Disposition: Status is: Inpatient Remains inpatient appropriate because: Severity of disease, IV fluids.     Time spent: 55 minutes  Author: Marrion Coy, MD 11/22/2022 12:28 PM  For on  call review [email or web address].

## 2022-11-22 NOTE — Inpatient Diabetes Management (Signed)
Inpatient Diabetes Program Recommendations  AACE/ADA: New Consensus Statement on Inpatient Glycemic Control (2015)  Target Ranges:  Prepandial:   less than 140 mg/dL      Peak postprandial:   less than 180 mg/dL (1-2 hours)      Critically ill patients:  140 - 180 mg/dL   Lab Results  Component Value Date   GLUCAP 272 (H) 11/22/2022   HGBA1C 6.5 (H) 11/18/2022    Review of Glycemic Control  Latest Reference Range & Units 11/21/22 07:32 11/21/22 11:55 11/21/22 16:26 11/21/22 21:50 11/22/22 07:52  Glucose-Capillary 70 - 99 mg/dL 101 (H) 751 (H) 025 (H) 190 (H) 272 (H)  (H): Data is abnormally high  Diabetes history: DM Outpatient Diabetes medications: Lantus 50 units QD, Januvia 100 mg QD Current orders for Inpatient glycemic control: Novolog 0-15 units TID and 0-5 units QHS  Inpatient Diabetes Program Recommendations:    Semglee 20 units QD  Will continue to follow while inpatient.  Thank you, Dulce Sellar, MSN, CDCES Diabetes Coordinator Inpatient Diabetes Program [phone] (team pager from 8a-5p)

## 2022-11-22 NOTE — Consult Note (Signed)
Hematology/Oncology Consult note [facility] [phone] [phone]  Patient Care Team: Housecalls, Doctors Making as PCP - General (Geriatric Medicine) Rozetta Nunneryeeves, Brandi N, MD as Referring Physician (Hematology and Oncology) Rozetta Nunneryeeves, Brandi N, MD as Referring Physician (Hematology and Oncology)   Name of the patient: Alex Smith  [id number]  November 18, 1934    Reason for consult: Anemia in the setting of smoldering multiple myeloma and MDS   Requesting physician: Dr. Chipper HerbZhang  Date of visit: 11/22/2022    History of presenting illness-patient is a 88 year old male with prior history of hairy cell leukemia currently in remission, smoldering multiple myeloma that has not required treatment.  He also has a history of MDS.  He was treated with EPO injections in the past which did not work for him and was switched to Luspatercept which she was getting as an outpatient.  Patient's heme globin was stable  around 10 with Luspatercept but he was noted to have progressive anemia during his outpatient visit in January 2024 as well.  At that time my plan was to increase his Luspatercept frequency to every 3 weeks and decide if anything needs to change in terms of his management.  Patient was admitted to the hospital with symptoms of generalized weakness and urinary frequency.  He was found to have AKI on CKD.  Baseline creatinine runs around 1.4 and had increased to 2.3 on admission which has not improved despite giving IV fluids.  Hemoglobin has dropped to 7.9 on admission.  ECOG PS- 3  Pain scale- 3   Review of systems- Review of Systems  Constitutional:  Positive for malaise/fatigue. Negative for chills, fever and weight loss.  HENT:  Negative for congestion, ear discharge and nosebleeds.   Eyes:  Negative for blurred vision.  Respiratory:  Negative for cough, hemoptysis, sputum production, shortness of breath and wheezing.   Cardiovascular:  Negative for  chest pain, palpitations, orthopnea and claudication.  Gastrointestinal:  Negative for abdominal pain, blood in stool, constipation, diarrhea, heartburn, melena, nausea and vomiting.  Genitourinary:  Negative for dysuria, flank pain, frequency, hematuria and urgency.  Musculoskeletal:  Negative for back pain, joint pain and myalgias.  Skin:  Negative for rash.  Neurological:  Negative for dizziness, tingling, focal weakness, seizures, weakness and headaches.  Endo/Heme/Allergies:  Does not bruise/bleed easily.  Psychiatric/Behavioral:  Negative for depression and suicidal ideas. The patient does not have insomnia.     Allergies  Allergen Reactions   Rituximab Rash    Chest tightness Chest tightness   Blood-Group Specific Substance Other (See Comments)    Had a post transfusion reaction of red blood cells; NOW REQUIRES WASHED BLOOD CELLS Had a post transfusion reaction of red blood cells; NOW REQUIRES WASHED BLOOD CELLS   Primaxin [Imipenem] Other (See Comments)    Possible allergy Tolerates cephalosporins   Voriconazole Other (See Comments)   Sulfa Antibiotics Itching and Rash   Sulfacetamide Sodium Itching and Rash    Patient Active Problem List   Diagnosis Date Noted   Pancytopenia 11/21/2022   Metabolic acidosis 11/21/2022   Overweight (BMI 25.0-29.9) 11/20/2022   Acute renal failure superimposed on stage 3a chronic kidney disease 11/19/2022   Acute respiratory failure with hypoxia 04/16/2022   Generalized weakness 04/16/2022   Chronic kidney disease, stage 3b 04/16/2022   COVID-19 04/10/2022   Hypercholesterolemia 11/12/2021   Hyperglycemia due to type 2 diabetes mellitus 11/12/2021   Closed compression fracture of L2 lumbar vertebra, initial encounter 08/18/2021   Alzheimer's dementia  08/18/2021   Acute lower UTI leading to weakness and dehydration 08/18/2021   Laceration of head 08/18/2021   Hypoglycemia 06/29/2021   Severe sepsis 05/04/2021   CAP (community acquired  pneumonia) 05/02/2021   Vitamin D deficiency 03/10/2019   Depressed mood 03/10/2019   Stage 3a chronic kidney disease (CKD) 11/14/2017   Goals of care, counseling/discussion 05/27/2017   Pancytopenia due to antineoplastic chemotherapy 11/17/2016   Chemotherapy-induced neutropenia 09/22/2016   Urinary incontinence 08/02/2016   Systolic murmur 05/28/2016   Myelodysplastic syndrome, low grade 02/05/2016   Multiple myeloma not having achieved remission 02/05/2016   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   Myelodysplasia present in bone marrow 11/03/2015   Advanced care planning/counseling discussion 05/22/2015   Insomnia 03/03/2015   Monoclonal gammopathy 02/03/2015   Macrocytic anemia 02/03/2015   Obesity (BMI 30-39.9) 10/12/2014   History of nonmelanoma skin cancer 10/10/2014   Medicare annual wellness visit, subsequent 04/12/2014   Health maintenance examination 04/12/2014   Hairy cell leukemia, in remission    B12 deficiency    BPH (benign prostatic hyperplasia)    Essential hypertension    GERD (gastroesophageal reflux disease)    Dyslipidemia    Type 2 diabetes mellitus with stage 3 chronic kidney disease    Diverticulosis    Macular hole 07/07/2012     Past Medical History:  Diagnosis Date   Alzheimer's dementia (HCC) 08/18/2021   Anemia    B12 deficiency    Basal cell carcinoma 03/30/2018   left ant nasal ala   Basal cell carcinoma of face 01/19/2013   right distal dorsum nose   BPH (benign prostatic hypertrophy)    followed by urology, discharged (Dr. Lonna Cobb)   CAP (community acquired pneumonia) 02/15/2015   CKD stage 3 due to type 2 diabetes mellitus (HCC) 11/14/2017   Colon polyps    Diverticulosis    Dysplastic nevus 10/27/2006   right med calf   GERD (gastroesophageal reflux disease)    Hairy cell leukemia (HCC) 2006   recurrent, seizure on rituxan, now on cladribine [name])   History of pneumonia 2000's   "once" (07/07/2012)   History of shingles    HLD  (hyperlipidemia)    Hypertension    Pneumonia    Shortness of breath dyspnea    Squamous cell carcinoma in situ 07/05/2020   Left lat. pretibial. 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 EDC 09/07/2020   Squamous cell carcinoma of skin 04/03/2020   Left cheek. WD SCC   Systolic murmur 05/28/2016   Type 2 diabetes, controlled, with retinopathy (HCC)      Past Surgical History:  Procedure Laterality Date   25 GAUGE PARS PLANA VITRECTOMY WITH 20 GAUGE MVR PORT FOR MACULAR HOLE  07/07/2012   Procedure: 25 GAUGE PARS PLANA VITRECTOMY WITH 20 GAUGE MVR PORT FOR MACULAR HOLE;  Surgeon: Sherrie George, MD;  Location[facility] OR;  Service: Ophthalmology;  Laterality: Left;   BONE MARROW BIOPSY  2016   CARDIOVASCULAR STRESS TEST  2013   treadmill - no evidence ischemia, EF 61%   CATARACT EXTRACTION W/ INTRAOCULAR LENS  IMPLANT, BILATERAL  ~ 2010   COLONOSCOPY  2014   Elliot WNL no rpt needed, h/o polyps   EYE SURGERY Left 06/2012   laser surgery   GAS INSERTION  07/07/2012   Procedure: INSERTION OF GAS;  Surgeon: Sherrie George, MD;  Location: [facility] OR;  Service: Ophthalmology;  Laterality: Left;  C3F8   PERIPHERAL VASCULAR  CATHETERIZATION N/A 02/23/2015   Procedure: Shelda Pal Cath Insertion;  Surgeon: Annice Needy, MD;  Location: [facility] INVASIVE CV LAB;  Service: Cardiovascular;  Laterality: N/A;   PORTA CATH REMOVAL N/A 02/16/2020   Procedure: PORTA CATH REMOVAL;  Surgeon: Annice Needy, MD;  Location: [facility] INVASIVE CV LAB;  Service: Cardiovascular;  Laterality: N/A;   SERUM PATCH  07/07/2012   Procedure: SERUM PATCH;  Surgeon: Sherrie George, MD;  Location: [facility] OR;  Service: Ophthalmology;  Laterality: Left;   SKIN CANCER EXCISION     "all over my face" (07/07/2012)    Social History   Socioeconomic History   Marital status: Married    Spouse name: Not on file   Number of children: Not on file   Years of education: Not on file   Highest education  level: Not on file  Occupational History   Not on file  Tobacco Use   Smoking status: Former    Packs/day: 2.00    Years: 25.00    Additional pack years: 0.00    Total pack years: 50.00    Types: Cigarettes    Quit date: 02/06/1969    Years since quitting: 53.8   Smokeless tobacco: Never   Tobacco comments:    07/07/2012 "stopped smoking ~ 40 yr ago; smoked 20-15yr"  Vaping Use   Vaping Use: Never used  Substance and Sexual Activity   Alcohol use: Not Currently   Drug use: No   Sexual activity: Never  Other Topics Concern   Not on file  Social History Narrative   Lives at home alone. 1 cat   Occupation: was Tree surgeon, works part time for [employer]   Activity: likes to golf   Diet: moderate water, fruits/vegetables daily   Social Determinants of Corporate investment banker Strain: Not on file  Food Insecurity: Not on file  Transportation Needs: Not on file  Physical Activity: Not on file  Stress: Not on file  Social Connections: Not on file  Intimate Partner Violence: Not on file     Family History  Problem Relation Age of Onset   Dementia Mother    Heart failure Father 77   Cancer Sister        breast   Diabetes Paternal Uncle    Diabetes Paternal Aunt    CAD Brother 28       MI   Stroke Neg Hx      Current Facility-Administered Medications:    acetaminophen (TYLENOL) tablet 650 mg, 650 mg, Oral, Q6H PRN **OR** acetaminophen (TYLENOL) suppository 650 mg, 650 mg, Rectal, Q6H PRN, Andris Baumann, MD   amLODipine (NORVASC) tablet 10 mg, 10 mg, Oral, Daily, Lindajo Royal V, MD, 10 mg at 11/22/22 1610   aspirin EC tablet 81 mg, 81 mg, Oral, Daily, Lindajo Royal V, MD, 81 mg at 11/22/22 0812   enoxaparin (LOVENOX) injection 30 mg, 30 mg, Subcutaneous, Q24H, Lindajo Royal V, MD, 30 mg at 11/22/22 9604   insulin aspart (novoLOG) injection 0-15 Units, 0-15 Units, Subcutaneous, TID WC, Lindajo Royal V, MD, 8 Units at 11/22/22 1202   insulin aspart (novoLOG)  injection 0-5 Units, 0-5 Units, Subcutaneous, QHS, Lindajo Royal V, MD, 2 Units at 11/19/22 2214   melatonin tablet 2.5 mg, 2.5 mg, Oral, QHS, Lindajo Royal V, MD, 2.5 mg at 11/21/22 2217   ondansetron (ZOFRAN) tablet 4 mg, 4 mg, Oral, Q6H PRN **OR** ondansetron (ZOFRAN) injection 4 mg, 4 mg, Intravenous, Q6H PRN, Andris Baumann,  MD   pantoprazole (PROTONIX) EC tablet 40 mg, 40 mg, Oral, BID, Andris Baumann, MD, 40 mg at 11/22/22 4782   pravastatin (PRAVACHOL) tablet 20 mg, 20 mg, Oral, QPM, Andris Baumann, MD, 20 mg at 11/21/22 1704   sertraline (ZOLOFT) tablet 25 mg, 25 mg, Oral, Daily, Lindajo Royal V, MD, 25 mg at 11/22/22 9562   sodium bicarbonate 75 mEq in sodium chloride 0.45 % 1,075 mL infusion, , Intravenous, Continuous, Marrion Coy, MD, Last Rate: 75 mL/hr at 11/22/22 1503, Infusion Verify at 11/22/22 1503   tamsulosin (FLOMAX) capsule 0.4 mg, 0.4 mg, Oral, Daily, Lindajo Royal V, MD, 0.4 mg at 11/22/22 1308  Facility-Administered Medications Ordered in Other Encounters:    heparin lock flush 100 unit/mL, 500 Units, Intravenous, Once, Corcoran, Melissa C, MD   luspatercept-aamt (REBLOZYL) subcutaneous injection 100 mg, 100 mg, Subcutaneous, Q21 days, Creig Hines, MD, 100 mg at 08/27/22 1206   sodium chloride 0.9 % injection 10 mL, 10 mL, Intravenous, PRN, [name], Melissa C, MD, 10 mL at 03/03/15 0903   sodium chloride 0.9 % injection 10 mL, 10 mL, Intracatheter, PRN, [name], Melissa C, MD, 10 mL at 03/10/15 1410   sodium chloride flush (NS) 0.9 % injection 10 mL, 10 mL, Intravenous, PRN, Nelva Nay C, MD, 10 mL at 07/23/18 1517   Physical exam:  Vitals:   11/21/22 1625 11/21/22 2002 11/22/22 0417 11/22/22 0751  BP: (!) 156/67 (!) 175/64 139/70 (!) 171/84  Pulse: 86 91 92 85  Resp: Temp: 98.3 F (36.8 C) 99 F (37.2 C) 98.4 F (36.9 C) 98.3 F (36.8 C)  TempSrc:      SpO2: 95% 97% 95% 95%  Weight:      Height:       Physical  Exam Constitutional:      Comments: Appears fatigued  Cardiovascular:     Rate and Rhythm: Normal rate and regular rhythm.     Heart sounds: Normal heart sounds.  Pulmonary:     Effort: Pulmonary effort is normal.     Breath sounds: Normal breath sounds.  Abdominal:     General: Bowel sounds are normal.     Palpations: Abdomen is soft.  Musculoskeletal:     Right lower leg: No edema.     Left lower leg: No edema.  Skin:    General: Skin is warm and dry.  Neurological:     Mental Status: He is alert and oriented to person, place, and time.           Latest Ref Rng & Units 11/22/2022    5:10 AM  CMP  Glucose 70 - 99 mg/dL 657   BUN 8 - 23 mg/dL 37   Creatinine 8.46 - 1.24 mg/dL 9.62   Sodium 952 - 841 mmol/L 136   Potassium 3.5 - 5.1 mmol/L 4.3   Chloride 98 - 111 mmol/L 109   CO2 22 - 32 mmol/L 21   Calcium 8.9 - 10.3 mg/dL 8.1       Latest Ref Rng & Units 11/22/2022    5:10 AM  CBC  WBC 4.0 - 10.5 K/uL 4.4   Hemoglobin 13.0 - 17.0 g/dL 7.5   Hematocrit 32.4 - 52.0 % 24.0   Platelets 150 - 400 K/uL 104     @  US RENAL  Result Date: 11/19/2022 CLINICAL DATA:  Acute on chronic renal insufficiency EXAM: RENAL / URINARY TRACT ULTRASOUND COMPLETE COMPARISON:  04/09/2022 FINDINGS: Right Kidney:  Renal measurements: 10.2 x 6.1 x 4.8 cm = volume: 155.6 mL. Echogenicity within normal limits. Benign simple cortical cyst measuring 1.2 cm upper pole. No hydronephrosis or nephrolithiasis. Left Kidney: Renal measurements: 12.7 x 6.5 x 5.8 cm = volume: 240.9 mL. Echogenicity within normal limits. 1.1 cm simple cyst upper pole left kidney. The larger simple cysts seen posteriorly off the left kidney on prior CT is not well visualized. No hydronephrosis or nephrolithiasis. Bladder: Appears normal for degree of bladder distention. Other: Incidental cholelithiasis.  No evidence of cholecystitis. IMPRESSION: 1. Unremarkable appearance of the bilateral kidneys. 2. Incidental  cholelithiasis.  No evidence of cholecystitis. Electronically Signed   By: Sharlet Salina M.D.   On: 11/19/2022 18:22   DG Chest 2 View  Result Date: 11/18/2022 CLINICAL DATA:  Generalized weakness EXAM: CHEST - 2 VIEW COMPARISON:  04/09/2022 FINDINGS: Cardiac and mediastinal contours are unchanged. No focal pulmonary opacity. No pleural effusion or pneumothorax. No acute osseous abnormality. IMPRESSION: No acute cardiopulmonary process. Electronically Signed   By: Wiliam Ke M.D.   On: 11/18/2022 23:48    Assessment and plan- Patient is a 88 y.o. male with prior history of hairy cell leukemia in remission, smoldering multiple myeloma and MDS on outpatient Luspatercept admitted for AKI on CKD and worsening anemia  Normocytic anemia: I had ordered anemia workup yesterday which shows normal iron and B12 levels.  No evidence of hemolysis.  Despite increasing his frequency of Luspatercept in January 2024 there has been no significant improvement in his anemia.  Myeloma panel is currently pending but serum free light chain ratio has remained overall stable.  His kappa free light chain was about 73 8 months ago and is 76 today without a clear rising trend.  If patient remains inpatient until Monday I will plan for bone marrow biopsy to see if there is any concern for progression of smoldering multiple myeloma to overt multiple myeloma.  Will also give was idea about how his MDS is doing.  If patient gets discharged we will plan to get bone marrow biopsy as an outpatient.  I have discussed all this in detail with patient and his daughter today.  Thank you for this kind referral and the opportunity to participate in the care of this patient   Visit Diagnosis 1. AKI (acute kidney injury)   2. Generalized weakness     Dr. Owens Shark, MD, MPH [facility] [phone] 11/22/2022

## 2022-11-23 DIAGNOSIS — N178 Other acute kidney failure: Secondary | ICD-10-CM | POA: Diagnosis not present

## 2022-11-23 DIAGNOSIS — D61818 Other pancytopenia: Secondary | ICD-10-CM | POA: Diagnosis not present

## 2022-11-23 DIAGNOSIS — N1831 Chronic kidney disease, stage 3a: Secondary | ICD-10-CM | POA: Diagnosis not present

## 2022-11-23 DIAGNOSIS — R531 Weakness: Secondary | ICD-10-CM | POA: Diagnosis not present

## 2022-11-23 LAB — BASIC METABOLIC PANEL
Anion gap: 8 (ref 5–15)
BUN: 35 mg/dL — ABNORMAL HIGH (ref 8–23)
CO2: 24 mmol/L (ref 22–32)
Calcium: 8 mg/dL — ABNORMAL LOW (ref 8.9–10.3)
Chloride: 103 mmol/L (ref 98–111)
Creatinine, Ser: 2.29 mg/dL — ABNORMAL HIGH (ref 0.61–1.24)
GFR, Estimated: 27 mL/min — ABNORMAL LOW (ref >60)
Glucose, Bld: 201 mg/dL — ABNORMAL HIGH (ref 70–99)
Potassium: 4 mmol/L (ref 3.5–5.1)
Sodium: 135 mmol/L (ref 135–145)

## 2022-11-23 LAB — GLUCOSE, CAPILLARY
Glucose-Capillary: 210 mg/dL — ABNORMAL HIGH (ref 70–99)
Glucose-Capillary: 260 mg/dL — ABNORMAL HIGH (ref 70–99)
Glucose-Capillary: 146 mg/dL — ABNORMAL HIGH (ref 70–99)
Glucose-Capillary: 145 mg/dL — ABNORMAL HIGH (ref 70–99)

## 2022-11-23 LAB — CBC
HCT: 23.6 % — ABNORMAL LOW (ref 39.0–52.0)
Hemoglobin: 7.5 g/dL — ABNORMAL LOW (ref 13.0–17.0)
MCH: 31.9 pg (ref 26.0–34.0)
MCHC: 31.8 g/dL (ref 30.0–36.0)
MCV: 100.4 fL — ABNORMAL HIGH (ref 80.0–100.0)
Platelets: 100 10*3/uL — ABNORMAL LOW (ref 150–400)
RBC: 2.35 MIL/uL — ABNORMAL LOW (ref 4.22–5.81)
RDW: 15.9 % — ABNORMAL HIGH (ref 11.5–15.5)
WBC: 4.7 10*3/uL (ref 4.0–10.5)
nRBC: 0 % (ref 0.0–0.2)

## 2022-11-23 LAB — MAGNESIUM: Magnesium: 2.1 mg/dL (ref 1.7–2.4)

## 2022-11-23 NOTE — Progress Notes (Signed)
Progress Note   Patient: Alex Smith [id number] DOB: [birth year] DOA: 11/19/2022     3 DOS: the patient was seen and examined on 11/23/2022   Brief hospital course: Alex Smith is a 88 y.o. male with medical history significant for HTN, smoldering myeloma, hairy cell leukemia, MDS on Luspatercept for anemia, followed by Dr. Smith Robert of oncology, as well as insulin-dependent type 2 diabetes, BPH, CKD 3A, Alzheimer's dementia, HTN, who was sent to the ED from ALF for evaluation of weakness and dysuria and concern for UTI.  Patient has been constipated, nausea, reduced liquid intake, he has acute on chronic renal failure.  Patient renal function does not improve after giving fluids, more anemic.  Consult obtained from oncology as well as nephrology.   Principal Problem:   Generalized weakness Active Problems:   Acute renal failure superimposed on stage 3a chronic kidney disease   BPH (benign prostatic hyperplasia)   Alzheimer's dementia   Essential hypertension   Type 2 diabetes mellitus with stage 3 chronic kidney disease   Hairy cell leukemia, in remission   Monoclonal gammopathy   Smoldering myeloma   Overweight (BMI 25.0-29.9)   Pancytopenia   Metabolic acidosis   Assessment and Plan:  Acute renal failure superimposed on stage 3a chronic kidney disease Mild metabolic acidosis. Renal function started improving after fluids.  Appreciate nephrology consult.  Will continue IV fluids for now, patient does not have any volume overload.   Hairy cell leukemia, in remission Smoldering myeloma MDS Pancytopenia with anemia and thrombocytopenia. Patient has been seen by oncology, planning for bone marrow biopsy. Hemoglobin now is more stable.  Continue to follow.     Generalized weakness Physical deconditioning Patient still has some weakness, but seems to been doing better with physical therapy. Probably can go home tomorrow with PT and OT. Patient has no evidence of urinary tract  infection.       BPH (benign prostatic hyperplasia) Continue tamsulosin   Alzheimer's dementia Dementia precautions     Type 2 diabetes mellitus with stage 3 chronic kidney disease Sliding scale insulin coverage   Essential hypertension Continue amlodipine      Subjective:  Patient still has severe weakness, appeared to be worsened then 2 days ago.  No shortness of breath today.  Physical Exam: Vitals:   11/22/22 1628 11/22/22 1927 11/23/22 0500 11/23/22 0746  BP: (!) 150/66 (!) 158/68 (!) 154/74 (!) 161/67  Pulse: 94 89 83 77  Resp: 18 17 18 18   Temp: 98.6 F (37 C) 98.8 F (37.1 C) 98.6 F (37 C) 97.8 F (36.6 C)  TempSrc:   Oral Oral  SpO2: 95% 93% 93% 92%  Weight:      Height:       General exam: Appears calm and comfortable  Respiratory system: Clear to auscultation. Respiratory effort normal. Cardiovascular system: S1 & S2 heard, RRR. No JVD, murmurs, rubs, gallops or clicks. No pedal edema. Gastrointestinal system: Abdomen is nondistended, soft and nontender. No organomegaly or masses felt. Normal bowel sounds heard. Central nervous system: Alert and oriented x2. No focal neurological deficits. Extremities: Symmetric 5 x 5 power. Skin: No rashes, lesions or ulcers Psychiatry: Mood & affect appropriate.    Data Reviewed:  Lab results reviewed.  Family Communication: Son updated at bedside.  Disposition: Status is: Inpatient Remains inpatient appropriate because: Severity of disease, IV treatment.     Time spent: 35 minutes  Author: Marrion Coy, MD 11/23/2022 12:32 PM  For on call review [email or web address].

## 2022-11-23 NOTE — Progress Notes (Signed)
[facility]  PROGRESS NOTE   Subjective:   Patient seen at bedside.  Feels much better.  Objective:  Vital signs: Blood pressure (!) 161/67, pulse 77, temperature 97.8 F (36.6 C), temperature source Oral, resp. rate 18, height 5\' 7"  (1.702 m), weight 86.2 kg, SpO2 92 %.  Intake/Output Summary (Last 24 hours) at 11/23/2022 1432 Last data filed at 11/23/2022 0501 Gross per 24 hour  Intake 1309.68 ml  Output 550 ml  Net 759.68 ml   Filed Weights   11/18/22 2314  Weight: 86.2 kg     Physical Exam: General:  No acute distress  Head:  Normocephalic, atraumatic. Moist oral mucosal membranes  Eyes:  Anicteric  Neck:  Supple  Lungs:   Clear to auscultation, normal effort  Heart:  S1S2 no rubs  Abdomen:   Soft, nontender, bowel sounds present  Extremities:  peripheral edema.  Neurologic:  Awake, alert, following commands  Skin:  No lesions  Access:     Basic Metabolic Panel: Recent Labs  Lab 11/19/22 0531 11/20/22 0838 11/21/22 0531 11/22/22 0510 11/23/22 0548  NA 139 135 135 136 135  K 3.7 4.3 4.2 4.3 4.0  CL 112* 107 107 109 103  CO2 22 23 21* 21* 24  GLUCOSE 58* 189* 186* 289* 201*  BUN 26* 28* 33* 37* 35*  CREATININE 2.26* 2.30* 2.37* 2.51* 2.29*  CALCIUM 8.2* 8.2* 8.0* 8.1* 8.0*  MG  --  2.1 2.1  --  2.1   GFR: Estimated Creatinine Clearance: 24.3 mL/min (A) (by C-G formula based on SCr of 2.29 mg/dL (H)).  Liver Function Tests: Recent Labs  Lab 11/21/22 0932  AST 41  ALT 24  ALKPHOS 72  BILITOT 0.8  PROT 7.2  ALBUMIN 2.9*   No results for input(s): "LIPASE", "AMYLASE" in the last 168 hours. No results for input(s): "AMMONIA" in the last 168 hours.  CBC: Recent Labs  Lab 11/18/22 2318 11/21/22 0531 11/22/22 0510 11/23/22 0548  WBC 6.8 5.0 4.4 4.7  HGB 9.2* 7.9* 7.5* 7.5*  HCT 29.5* 25.1* 24.0* 23.6*  MCV 102.1* 99.6 99.2 100.4*  PLT 120* 104* 104* 100*     HbA1C: Hgb A1c MFr Bld  Date/Time Value Ref Range Status   11/18/2022 11:18 PM 6.5 (H) 4.8 - 5.6 % Final    Comment:    (NOTE)         Prediabetes: 5.7 - 6.4         Diabetes: >6.4         Glycemic control for adults with diabetes: <7.0   06/29/2021 02:57 AM 4.9 4.8 - 5.6 % Final    Comment:    (NOTE) Pre diabetes:          5.7%-6.4%  Diabetes:              >6.4%  Glycemic control for   <7.0% adults with diabetes     Urinalysis: No results for input(s): "COLORURINE", "LABSPEC", "PHURINE", "GLUCOSEU", "HGBUR", "BILIRUBINUR", "KETONESUR", "PROTEINUR", "UROBILINOGEN", "NITRITE", "LEUKOCYTESUR" in the last 72 hours.  Invalid input(s): "APPERANCEUR"    Imaging: No results found.   Medications:    sodium bicarbonate 75 mEq in sodium chloride 0.45 % 1,075 mL infusion 75 mL/hr at 11/23/22 0501    amLODipine  10 mg Oral Daily   aspirin EC  81 mg Oral Daily   enoxaparin (LOVENOX) injection  30 mg Subcutaneous Q24H   insulin aspart  0-15 Units Subcutaneous TID WC   insulin aspart  0-5  Units Subcutaneous QHS   melatonin  2.5 mg Oral QHS   pantoprazole  40 mg Oral BID   pravastatin  20 mg Oral QPM   sertraline  25 mg Oral Daily   tamsulosin  0.4 mg Oral Daily    Assessment/ Plan:     88 y.o.  male with Alzheimer's dementia, BPH, Hairy cell leukemia, hypertension, hyperlipidemia, diabetes mellitus type II, diabetic retinopathy, , who was admitted to [facility] on 11/19/2022 for Generalized weakness [R53.1] AKI (acute kidney injury) [N17.9] Acute renal failure superimposed on stage 3a chronic kidney disease [N17.9, N18.31]   Acute kidney injury: on chronic kidney disease stage IIIA with baseline creatinine of 1.45, GFR of 47 on 08/27/2022. Patient with history of hematuria, proteinuria and glycosuria. Consistent with diabetic nephropathy. Acute kidney injury most likely secondary to prerenal azotemia. No obstruction on ultrasound. No IV contrast exposure. No exposure to any other nephrotoxic agents. Continue the IV fluids with sodium  bicarbonate.   Diabetes mellitus type II with chronic kidney disease and renal manifestations. Insulin dependent. Not on SGLT-2 or ACE-I/ARB   Hypertension with chronic kidney disease:  - on amlodipine and tamsulosin.    Anemia with chronic kidney disease, with history of smoldering myeloma and remission of Hairy cell leukemia. Followed by Dr. Smith Robert, [facility]. Hemoglobin 7.5. with thrombocytopenia.  Will follow closely.   LOS: 3 Lorain Childes, MD [facility] 4/6/20242:32 PM

## 2022-11-24 DIAGNOSIS — R531 Weakness: Secondary | ICD-10-CM | POA: Diagnosis not present

## 2022-11-24 DIAGNOSIS — D61818 Other pancytopenia: Secondary | ICD-10-CM | POA: Diagnosis not present

## 2022-11-24 DIAGNOSIS — D472 Monoclonal gammopathy: Secondary | ICD-10-CM | POA: Diagnosis not present

## 2022-11-24 DIAGNOSIS — N178 Other acute kidney failure: Secondary | ICD-10-CM | POA: Diagnosis not present

## 2022-11-24 LAB — BASIC METABOLIC PANEL
Anion gap: 6 (ref 5–15)
BUN: 31 mg/dL — ABNORMAL HIGH (ref 8–23)
CO2: 25 mmol/L (ref 22–32)
Calcium: 7.9 mg/dL — ABNORMAL LOW (ref 8.9–10.3)
Chloride: 104 mmol/L (ref 98–111)
Creatinine, Ser: 2.28 mg/dL — ABNORMAL HIGH (ref 0.61–1.24)
GFR, Estimated: 27 mL/min — ABNORMAL LOW (ref >60)
Glucose, Bld: 152 mg/dL — ABNORMAL HIGH (ref 70–99)
Potassium: 3.9 mmol/L (ref 3.5–5.1)
Sodium: 135 mmol/L (ref 135–145)

## 2022-11-24 LAB — CBC
HCT: 23.1 % — ABNORMAL LOW (ref 39.0–52.0)
Hemoglobin: 7.3 g/dL — ABNORMAL LOW (ref 13.0–17.0)
MCH: 31.6 pg (ref 26.0–34.0)
MCHC: 31.6 g/dL (ref 30.0–36.0)
MCV: 100 fL (ref 80.0–100.0)
Platelets: 103 10*3/uL — ABNORMAL LOW (ref 150–400)
RBC: 2.31 MIL/uL — ABNORMAL LOW (ref 4.22–5.81)
RDW: 15.8 % — ABNORMAL HIGH (ref 11.5–15.5)
WBC: 3.4 10*3/uL — ABNORMAL LOW (ref 4.0–10.5)
nRBC: 0 % (ref 0.0–0.2)

## 2022-11-24 LAB — GLUCOSE, CAPILLARY
Glucose-Capillary: 175 mg/dL — ABNORMAL HIGH (ref 70–99)
Glucose-Capillary: 225 mg/dL — ABNORMAL HIGH (ref 70–99)
Glucose-Capillary: 251 mg/dL — ABNORMAL HIGH (ref 70–99)
Glucose-Capillary: 197 mg/dL — ABNORMAL HIGH (ref 70–99)

## 2022-11-24 LAB — MAGNESIUM: Magnesium: 2.2 mg/dL (ref 1.7–2.4)

## 2022-11-24 MED ORDER — QUETIAPINE FUMARATE 25 MG PO TABS
25.0000 mg | ORAL_TABLET | Freq: Every day | ORAL | Status: DC
  Administered 2022-11-24 – 2022-11-26 (×3): 25 mg via ORAL
  Filled 2022-11-24 (×3): qty 1

## 2022-11-24 NOTE — Progress Notes (Signed)
  Progress Note   Patient: Alex Smith UDJ:[id number] DOB: 30-Mar-1935 DOA: 11/19/2022     4 DOS: the patient was seen and examined on 11/24/2022   Brief hospital course: SVANIK GEISER is a 88 y.o. male with medical history significant for HTN, smoldering myeloma, hairy cell leukemia, MDS on Luspatercept for anemia, followed by Dr. Smith Robert of oncology, as well as insulin-dependent type 2 diabetes, BPH, CKD 3A, Alzheimer's dementia, HTN, who was sent to the ED from ALF for evaluation of weakness and dysuria and concern for UTI.  Patient has been constipated, nausea, reduced liquid intake, he has acute on chronic renal failure.  Patient renal function does not improve after giving fluids, more anemic.  Consult obtained from oncology as well as nephrology.   Principal Problem:   Generalized weakness Active Problems:   Acute renal failure superimposed on stage 3a chronic kidney disease   BPH (benign prostatic hyperplasia)   Alzheimer's dementia   Essential hypertension   Type 2 diabetes mellitus with stage 3 chronic kidney disease   Hairy cell leukemia, in remission   Monoclonal gammopathy   Smoldering myeloma   Overweight (BMI 25.0-29.9)   Pancytopenia   Metabolic acidosis   Assessment and Plan:  Acute renal failure superimposed on stage 3a chronic kidney disease Mild metabolic acidosis. Renal function improved to baseline.  Discontinue IV fluids.   Hairy cell leukemia, in remission Smoldering myeloma MDS Pancytopenia with anemia and thrombocytopenia. Patient has been seen by oncology, planning for bone marrow biopsy. Hemoglobin had dropped down to 7.3, will recheck a CBC tomorrow, transfuse with hemoglobin less than 7.     Generalized weakness Physical deconditioning. Patient has no evidence of urinary tract infection. Patient seem to have worsening weakness, continue to follow with PT/OT.     BPH (benign prostatic hyperplasia) Continue tamsulosin   Alzheimer's dementia with  delirium. Patient had agitation last night, could not sleep.  Seroquel added.     Type 2 diabetes mellitus with stage 3 chronic kidney disease Sliding scale insulin coverage   Essential hypertension Continue amlodipine     Subjective:  Patient has a worsening weakness today, he could not sleep last night, pulling his IV.  Physical Exam: Vitals:   11/23/22 1934 11/24/22 0409 11/24/22 0410 11/24/22 0728  BP: (!) 158/67 (!) 122/103 (!) 160/67 (!) 172/66  Pulse: 86 76 77 77  Resp: 17 18  16   Temp: 98.3 F (36.8 C) 97.6 F (36.4 C)  97.8 F (36.6 C)  TempSrc:    Oral  SpO2: 93% 92%  93%  Weight:      Height:       General exam: Appears calm and comfortable  Respiratory system: Clear to auscultation. Respiratory effort normal. Cardiovascular system: S1 & S2 heard, RRR. No JVD, murmurs, rubs, gallops or clicks. No pedal edema. Gastrointestinal system: Abdomen is nondistended, soft and nontender. No organomegaly or masses felt. Normal bowel sounds heard. Central nervous system: Alert and oriented x2. No focal neurological deficits. Extremities: Symmetric 5 x 5 power. Skin: No rashes, lesions or ulcers Psychiatry: Judgement and insight appear normal. Mood & affect appropriate.    Data Reviewed:  Lab results reviewed.  Family Communication: None  Disposition: Status is: Inpatient Remains inpatient appropriate because: Severity of disease.  Pending inpatient procedure tomorrow.     Time spent: 35 minutes  Author: Marrion Coy, MD 11/24/2022 10:56 AM  For on call review [email or web address].

## 2022-11-24 NOTE — Progress Notes (Signed)
[facility]  PROGRESS NOTE   Subjective:   Patient seen at bedside.  Feels much better.  Ate well today.  Objective:  Vital signs: Blood pressure (!) 172/66, pulse 77, temperature 97.8 F (36.6 C), temperature source Oral, resp. rate 16, height 5\' 7"  (1.702 m), weight 86.2 kg, SpO2 93 %.  Intake/Output Summary (Last 24 hours) at 11/24/2022 1205 Last data filed at 11/24/2022 0440 Gross per 24 hour  Intake 2170.66 ml  Output 1100 ml  Net 1070.66 ml   Filed Weights   11/18/22 2314  Weight: 86.2 kg     Physical Exam: General:  No acute distress  Head:  Normocephalic, atraumatic. Moist oral mucosal membranes  Eyes:  Anicteric  Neck:  Supple  Lungs:   Clear to auscultation, normal effort  Heart:  S1S2 no rubs  Abdomen:   Soft, nontender, bowel sounds present  Extremities:  peripheral edema.  Neurologic:  Awake, alert, following commands  Skin:  No lesions  Access:     Basic Metabolic Panel: Recent Labs  Lab 11/20/22 0838 11/21/22 0531 11/22/22 0510 11/23/22 0548 11/24/22 0523  NA 135 135 136 135 135  K 4.3 4.2 4.3 4.0 3.9  CL 107 107 109 103 104  CO2 23 21* 21* 24 25  GLUCOSE 189* 186* 289* 201* 152*  BUN 28* 33* 37* 35* 31*  CREATININE 2.30* 2.37* 2.51* 2.29* 2.28*  CALCIUM 8.2* 8.0* 8.1* 8.0* 7.9*  MG 2.1 2.1  --  2.1 2.2   GFR: Estimated Creatinine Clearance: 24.4 mL/min (A) (by C-G formula based on SCr of 2.28 mg/dL (H)).  Liver Function Tests: Recent Labs  Lab 11/21/22 0932  AST 41  ALT 24  ALKPHOS 72  BILITOT 0.8  PROT 7.2  ALBUMIN 2.9*   No results for input(s): "LIPASE", "AMYLASE" in the last 168 hours. No results for input(s): "AMMONIA" in the last 168 hours.  CBC: Recent Labs  Lab 11/18/22 2318 11/21/22 0531 11/22/22 0510 11/23/22 0548 11/24/22 0523  WBC 6.8 5.0 4.4 4.7 3.4*  HGB 9.2* 7.9* 7.5* 7.5* 7.3*  HCT 29.5* 25.1* 24.0* 23.6* 23.1*  MCV 102.1* 99.6 99.2 100.4* 100.0  PLT 120* 104* 104* 100* 103*      HbA1C: Hgb A1c MFr Bld  Date/Time Value Ref Range Status  11/18/2022 11:18 PM 6.5 (H) 4.8 - 5.6 % Final    Comment:    (NOTE)         Prediabetes: 5.7 - 6.4         Diabetes: >6.4         Glycemic control for adults with diabetes: <7.0   06/29/2021 02:57 AM 4.9 4.8 - 5.6 % Final    Comment:    (NOTE) Pre diabetes:          5.7%-6.4%  Diabetes:              >6.4%  Glycemic control for   <7.0% adults with diabetes     Urinalysis: No results for input(s): "COLORURINE", "LABSPEC", "PHURINE", "GLUCOSEU", "HGBUR", "BILIRUBINUR", "KETONESUR", "PROTEINUR", "UROBILINOGEN", "NITRITE", "LEUKOCYTESUR" in the last 72 hours.  Invalid input(s): "APPERANCEUR"    Imaging: No results found.   Medications:     amLODipine  10 mg Oral Daily   aspirin EC  81 mg Oral Daily   enoxaparin (LOVENOX) injection  30 mg Subcutaneous Q24H   insulin aspart  0-15 Units Subcutaneous TID WC   insulin aspart  0-5 Units Subcutaneous QHS   melatonin  2.5 mg Oral  QHS   pantoprazole  40 mg Oral BID   pravastatin  20 mg Oral QPM   QUEtiapine  25 mg Oral QHS   sertraline  25 mg Oral Daily   tamsulosin  0.4 mg Oral Daily    Assessment/ Plan:         LOS: 4 Lorain Childes, MD [facility] 4/7/202412:05 PM  [facility]  PROGRESS NOTE   Subjective:     Objective:  Vital signs: Blood pressure (!) 172/66, pulse 77, temperature 97.8 F (36.6 C), temperature source Oral, resp. rate 16, height 5\' 7"  (1.702 m), weight 86.2 kg, SpO2 93 %.  Intake/Output Summary (Last 24 hours) at 11/24/2022 1205 Last data filed at 11/24/2022 0440 Gross per 24 hour  Intake 2170.66 ml  Output 1100 ml  Net 1070.66 ml   Filed Weights   11/18/22 2314  Weight: 86.2 kg     Physical Exam: General:  No acute distress  Head:  Normocephalic, atraumatic. Moist oral mucosal membranes  Eyes:  Anicteric  Neck:  Supple  Lungs:   Clear to auscultation, normal effort  Heart:   S1S2 no rubs  Abdomen:   Soft, nontender, bowel sounds present  Extremities:  peripheral edema.  Neurologic:  Awake, alert, following commands  Skin:  No lesions  Access:     Basic Metabolic Panel: Recent Labs  Lab 11/20/22 0838 11/21/22 0531 11/22/22 0510 11/23/22 0548 11/24/22 0523  NA 135 135 136 135 135  K 4.3 4.2 4.3 4.0 3.9  CL 107 107 109 103 104  CO2 23 21* 21* 24 25  GLUCOSE 189* 186* 289* 201* 152*  BUN 28* 33* 37* 35* 31*  CREATININE 2.30* 2.37* 2.51* 2.29* 2.28*  CALCIUM 8.2* 8.0* 8.1* 8.0* 7.9*  MG 2.1 2.1  --  2.1 2.2   GFR: Estimated Creatinine Clearance: 24.4 mL/min (A) (by C-G formula based on SCr of 2.28 mg/dL (H)).  Liver Function Tests: Recent Labs  Lab 11/21/22 0932  AST 41  ALT 24  ALKPHOS 72  BILITOT 0.8  PROT 7.2  ALBUMIN 2.9*   No results for input(s): "LIPASE", "AMYLASE" in the last 168 hours. No results for input(s): "AMMONIA" in the last 168 hours.  CBC: Recent Labs  Lab 11/18/22 2318 11/21/22 0531 11/22/22 0510 11/23/22 0548 11/24/22 0523  WBC 6.8 5.0 4.4 4.7 3.4*  HGB 9.2* 7.9* 7.5* 7.5* 7.3*  HCT 29.5* 25.1* 24.0* 23.6* 23.1*  MCV 102.1* 99.6 99.2 100.4* 100.0  PLT 120* 104* 104* 100* 103*     HbA1C: Hgb A1c MFr Bld  Date/Time Value Ref Range Status  11/18/2022 11:18 PM 6.5 (H) 4.8 - 5.6 % Final    Comment:    (NOTE)         Prediabetes: 5.7 - 6.4         Diabetes: >6.4         Glycemic control for adults with diabetes: <7.0   06/29/2021 02:57 AM 4.9 4.8 - 5.6 % Final    Comment:    (NOTE) Pre diabetes:          5.7%-6.4%  Diabetes:              >6.4%  Glycemic control for   <7.0% adults with diabetes     Urinalysis: No results for input(s): "COLORURINE", "LABSPEC", "PHURINE", "GLUCOSEU", "HGBUR", "BILIRUBINUR", "KETONESUR", "PROTEINUR", "UROBILINOGEN", "NITRITE", "LEUKOCYTESUR" in the last 72 hours.  Invalid input(s): "APPERANCEUR"    Imaging: No results found.   Medications:  amLODipine   10 mg Oral Daily   aspirin EC  81 mg Oral Daily   enoxaparin (LOVENOX) injection  30 mg Subcutaneous Q24H   insulin aspart  0-15 Units Subcutaneous TID WC   insulin aspart  0-5 Units Subcutaneous QHS   melatonin  2.5 mg Oral QHS   pantoprazole  40 mg Oral BID   pravastatin  20 mg Oral QPM   QUEtiapine  25 mg Oral QHS   sertraline  25 mg Oral Daily   tamsulosin  0.4 mg Oral Daily    Assessment/ Plan:     88 y.o.  male with Alzheimer's dementia, BPH, Hairy cell leukemia, hypertension, hyperlipidemia, diabetes mellitus type II, diabetic retinopathy, , who was admitted to [facility] on 11/19/2022 for Generalized weakness [R53.1] AKI (acute kidney injury) [N17.9] Acute renal failure superimposed on stage 3a chronic kidney disease [N17.9, N18.31]   Acute kidney injury: on chronic kidney disease stage IIIA with baseline creatinine of 1.45, GFR of 47 on 08/27/2022. Patient with history of hematuria, proteinuria and glycosuria. Consistent with diabetic nephropathy. Acute kidney injury most likely secondary to prerenal azotemia. No obstruction on ultrasound. No IV contrast exposure. No exposure to any other nephrotoxic agents. Off of IV fluids now.   Diabetes mellitus type II with chronic kidney disease and renal manifestations. Insulin dependent. Not on SGLT-2 or ACE-I/ARB   Hypertension with chronic kidney disease:  - on amlodipine and tamsulosin.    Anemia with chronic kidney disease, with history of smoldering myeloma and remission of Hairy cell leukemia. Followed by Dr. Smith Robert, [facility]. Hemoglobin 7.5. with thrombocytopenia.   Will follow closely.    LOS: 4 Lorain Childes, MD [facility] 4/7/202412:05 PM

## 2022-11-25 DIAGNOSIS — N178 Other acute kidney failure: Secondary | ICD-10-CM | POA: Diagnosis not present

## 2022-11-25 DIAGNOSIS — R531 Weakness: Secondary | ICD-10-CM | POA: Diagnosis not present

## 2022-11-25 DIAGNOSIS — D61818 Other pancytopenia: Secondary | ICD-10-CM | POA: Diagnosis not present

## 2022-11-25 DIAGNOSIS — N1831 Chronic kidney disease, stage 3a: Secondary | ICD-10-CM | POA: Diagnosis not present

## 2022-11-25 LAB — MULTIPLE MYELOMA PANEL, SERUM
Albumin SerPl Elph-Mcnc: 2.9 g/dL (ref 2.9–4.4)
Albumin/Glob SerPl: 0.8 (ref 0.7–1.7)
Alpha 1: 0.4 g/dL (ref 0.0–0.4)
Alpha2 Glob SerPl Elph-Mcnc: 0.8 g/dL (ref 0.4–1.0)
B-Globulin SerPl Elph-Mcnc: 0.7 g/dL (ref 0.7–1.3)
Gamma Glob SerPl Elph-Mcnc: 1.7 g/dL (ref 0.4–1.8)
Globulin, Total: 3.7 g/dL (ref 2.2–3.9)
IgA: 22 mg/dL — ABNORMAL LOW (ref 61–437)
IgG (Immunoglobin G), Serum: 1943 mg/dL — ABNORMAL HIGH (ref 603–1613)
IgM (Immunoglobulin M), Srm: 33 mg/dL (ref 15–143)
M Protein SerPl Elph-Mcnc: 1.3 g/dL — ABNORMAL HIGH
Total Protein ELP: 6.6 g/dL (ref 6.0–8.5)

## 2022-11-25 LAB — BASIC METABOLIC PANEL
Anion gap: 6 (ref 5–15)
BUN: 32 mg/dL — ABNORMAL HIGH (ref 8–23)
CO2: 25 mmol/L (ref 22–32)
Calcium: 7.9 mg/dL — ABNORMAL LOW (ref 8.9–10.3)
Chloride: 105 mmol/L (ref 98–111)
Creatinine, Ser: 2.11 mg/dL — ABNORMAL HIGH (ref 0.61–1.24)
GFR, Estimated: 30 mL/min — ABNORMAL LOW (ref >60)
Glucose, Bld: 181 mg/dL — ABNORMAL HIGH (ref 70–99)
Potassium: 4.3 mmol/L (ref 3.5–5.1)
Sodium: 136 mmol/L (ref 135–145)

## 2022-11-25 LAB — CBC
HCT: 21.7 % — ABNORMAL LOW (ref 39.0–52.0)
Hemoglobin: 6.9 g/dL — ABNORMAL LOW (ref 13.0–17.0)
MCH: 31.8 pg (ref 26.0–34.0)
MCHC: 31.8 g/dL (ref 30.0–36.0)
MCV: 100 fL (ref 80.0–100.0)
Platelets: 101 10*3/uL — ABNORMAL LOW (ref 150–400)
RBC: 2.17 MIL/uL — ABNORMAL LOW (ref 4.22–5.81)
RDW: 15.7 % — ABNORMAL HIGH (ref 11.5–15.5)
WBC: 2.7 10*3/uL — ABNORMAL LOW (ref 4.0–10.5)
nRBC: 0 % (ref 0.0–0.2)

## 2022-11-25 LAB — PREPARE RBC (CROSSMATCH)

## 2022-11-25 LAB — GLUCOSE, CAPILLARY
Glucose-Capillary: 162 mg/dL — ABNORMAL HIGH (ref 70–99)
Glucose-Capillary: 279 mg/dL — ABNORMAL HIGH (ref 70–99)
Glucose-Capillary: 152 mg/dL — ABNORMAL HIGH (ref 70–99)

## 2022-11-25 LAB — TYPE AND SCREEN
ABO/RH(D): A NEG
Antibody Screen: NEGATIVE

## 2022-11-25 LAB — BPAM RBC: ISSUE DATE / TIME: 202404081433

## 2022-11-25 MED ORDER — SODIUM CHLORIDE 0.9% IV SOLUTION: Freq: Once | INTRAVENOUS | Status: AC

## 2022-11-25 NOTE — Progress Notes (Signed)
Physical Therapy Treatment Patient Details Name: Alex Smith MRN: [id number] DOB: 10/29/1934 Today's Date: 11/25/2022   History of Present Illness Alex Smith is a 88 y.o. male with medical history significant for HTN, smoldering myeloma, hairy cell leukemia, MDS on Luspatercept for anemia, followed by Dr. Smith Robert of oncology, as well as insulin-dependent type 2 diabetes, BPH, CKD 3A, Alzheimer's dementia, HTN, who was sent to the ED from SNF for evaluation of weakness and dysuria and concern for UTI.  Patient has had no fever or chills or vomiting or diarrhea.    PT Comments    Increased time spent with pt and pt's daughter to help assess pt's current function and safety with mobility per MD request. Initial plan was to return to ALF with HHPT, however pt has increased weakness with Hgb at 6.9 this am and slightly symptomatic with short distance gait. During session, pt struggled with bed mobility requiring ModA to transition from L side to sitting. Positive LOB while turning with personal RW, requiring ModA to prevent fall. Pt only receives assistance for bathing 3x/wk at ALF and will need to return to functional independent baseline prior to returning. After discussion with primary PT, will update d/c recommendations to short term stay at SNF once medically stable.   Recommendations for follow up therapy are one component of a multi-disciplinary discharge planning process, led by the attending physician.  Recommendations may be updated based on patient status, additional functional criteria and insurance authorization.  Follow Up Recommendations  Can patient physically be transported by private vehicle: Yes    Assistance Recommended at Discharge Intermittent Supervision/Assistance  Patient can return home with the following A little help with walking and/or transfers;A little help with bathing/dressing/bathroom;Assist for transportation;Help with stairs or ramp for entrance   Equipment  Recommendations  None recommended by PT (TBD at next facility)    Recommendations for Other Services       Precautions / Restrictions Precautions Precautions: None Restrictions Weight Bearing Restrictions: No     Mobility  Bed Mobility Overal bed mobility: Needs Assistance Bed Mobility: Supine to Sit     Supine to sit: Mod assist (With use of side rail which pt has at home)     General bed mobility comments:  (vc's needed for technique)    Transfers Overall transfer level: Needs assistance Equipment used: Rolling walker (2 wheels) Transfers: Sit to/from Stand, Bed to chair/wheelchair/BSC Sit to Stand: Min assist, Mod assist Stand pivot transfers: Mod assist         General transfer comment:  (+ LOB while turning with walker to begin gait training, requiring ModA to prevent fall backwards)    Ambulation/Gait Ambulation/Gait assistance: Min guard, Min assist Gait Distance (Feet):  (40) Assistive device: Rolling walker (2 wheels) Gait Pattern/deviations: Step-through pattern, Decreased step length - right, Decreased step length - left Gait velocity:  (decreased)     General Gait Details:  (Slow with increased risk for falls especially with changes in direction)   Stairs             Wheelchair Mobility    Modified Rankin (Stroke Patients Only)       Balance Overall balance assessment: Needs assistance Sitting-balance support: Bilateral upper extremity supported, Feet supported Sitting balance-Leahy Scale: Fair Sitting balance - Comments: Posterior LOB with feet unsupported in sitting Postural control: Posterior lean Standing balance support: Bilateral upper extremity supported, During functional activity, Reliant on assistive device for balance Standing balance-Leahy Scale: Poor Standing balance comment:  (  ModA to prevent LOB x 1 ocassion in standing)                            Cognition Arousal/Alertness: Awake/alert Behavior During  Therapy: WFL for tasks assessed/performed Overall Cognitive Status: Within Functional Limits for tasks assessed                                 General Comments: delayed response at times, some mild difficulty with expressive language        Exercises General Exercises - Lower Extremity Ankle Circles/Pumps: AROM, Both, 5 reps, Seated Long Arc Quad: AROM, Both, 5 reps, Seated Hip Flexion/Marching: AROM, Both, 5 reps, Seated    General Comments General comments (skin integrity, edema, etc.):  (Hgb 6.9, cleared by MD to assess mobility. Pt slightly dizzy in standing, resolved after seated rest break.)      Pertinent Vitals/Pain Pain Assessment Pain Assessment: No/denies pain    Home Living                          Prior Function            PT Goals (current goals can now be found in the care plan section) Acute Rehab PT Goals Patient Stated Goal: regain mobility and avoid loss of function    Frequency    Min 2X/week      PT Plan Discharge plan needs to be updated    Co-evaluation              AM-PAC PT "6 Clicks" Mobility   Outcome Measure  Help needed turning from your back to your side while in a flat bed without using bedrails?: A Little Help needed moving from lying on your back to sitting on the side of a flat bed without using bedrails?: A Lot Help needed moving to and from a bed to a chair (including a wheelchair)?: A Lot Help needed standing up from a chair using your arms (e.g., wheelchair or bedside chair)?: A Little Help needed to walk in hospital room?: A Little Help needed climbing 3-5 steps with a railing? : A Lot 6 Click Score: 15    End of Session Equipment Utilized During Treatment: Gait belt Activity Tolerance: Patient tolerated treatment well;Patient limited by fatigue Patient left: in chair;with call bell/phone within reach;with chair alarm set;with family/visitor present Nurse Communication: Mobility  status PT Visit Diagnosis: Unsteadiness on feet (R26.81);Difficulty in walking, not elsewhere classified (R26.2);Other abnormalities of gait and mobility (R26.89)     Time: 1100-1157 PT Time Calculation (min) (ACUTE ONLY): 57 min  Charges:  $Gait Training: 8-22 mins $Therapeutic Exercise: 8-22 mins $Therapeutic Activity: 23-37 mins                    Zadie Cleverly, PTA  Jannet Askew 11/25/2022, 12:15 PM

## 2022-11-25 NOTE — Progress Notes (Signed)
Progress Note   Patient: Alex Smith RXV:[id number] DOB: Nov 19, 1934 DOA: 11/19/2022     5 DOS: the patient was seen and examined on 11/25/2022   Brief hospital course: ANAS KEYE is a 88 y.o. male with medical history significant for HTN, smoldering myeloma, hairy cell leukemia, MDS on Luspatercept for anemia, followed by Dr. Smith Robert of oncology, as well as insulin-dependent type 2 diabetes, BPH, CKD 3A, Alzheimer's dementia, HTN, who was sent to the ED from ALF for evaluation of weakness and dysuria and concern for UTI.  Patient has been constipated, nausea, reduced liquid intake, he has acute on chronic renal failure.  Patient renal function does not improve after giving fluids, more anemic.  Consult obtained from oncology as well as nephrology. 4/8.  Hemoglobin dropped down to 6.9, given 1 unit PRBC.  Renal function improved after fluids.   Principal Problem:   Generalized weakness Active Problems:   Acute renal failure superimposed on stage 3a chronic kidney disease   BPH (benign prostatic hyperplasia)   Alzheimer's dementia   Essential hypertension   Type 2 diabetes mellitus with stage 3 chronic kidney disease   Hairy cell leukemia, in remission   Monoclonal gammopathy   Smoldering myeloma   Overweight (BMI 25.0-29.9)   Pancytopenia   Metabolic acidosis   Assessment and Plan:  Acute renal failure superimposed on stage 3a chronic kidney disease Mild metabolic acidosis. Renal function has improved after giving IV fluids.   Hairy cell leukemia, in remission Smoldering myeloma MDS Pancytopenia with anemia and thrombocytopenia. Patient has been seen by oncology, planning for bone marrow biopsy. Hemoglobin 6.9, transfuse 1 unit PRBC.  Bone marrow biopsy is scheduled on Wednesday.     Generalized weakness Physical deconditioning. Patient has no evidence of urinary tract infection. Patient is reevaluated by PT/OT, not recommended nursing home placement due to increased  weakness.     BPH (benign prostatic hyperplasia) Continue tamsulosin   Alzheimer's dementia with delirium. Patient had agitation last night, could not sleep.  Seroquel added.     Type 2 diabetes mellitus with stage 3 chronic kidney disease Sliding scale insulin coverage   Essential hypertension Continue amlodipine     Subjective:  Patient had increased weakness, no shortness of breath.  Physical Exam: Vitals:   11/24/22 1521 11/24/22 1600 11/24/22 2124 11/25/22 0502  BP: (!) 138/54 (!) 152/61 (!) 151/63 138/72  Pulse: 73 74 81 75  Resp: 17 20 20 20   Temp: 98.1 F (36.7 C) 98.3 F (36.8 C) 98.2 F (36.8 C) 98.5 F (36.9 C)  TempSrc: Oral  Oral Oral  SpO2: 91% 91% 92% 94%  Weight:      Height:       General exam: Appears calm and comfortable  Respiratory system: Clear to auscultation. Respiratory effort normal. Cardiovascular system: S1 & S2 heard, RRR. No JVD, murmurs, rubs, gallops or clicks. No pedal edema. Gastrointestinal system: Abdomen is nondistended, soft and nontender. No organomegaly or masses felt. Normal bowel sounds heard. Central nervous system: Alert and oriented. No focal neurological deficits. Extremities: Symmetric 5 x 5 power. Skin: No rashes, lesions or ulcers Psychiatry: Judgement and insight appear normal. Mood & affect appropriate.    Data Reviewed:  Lab results reviewed.  Family Communication: Daughter updated at bedside.  Disposition: Status is: Inpatient Remains inpatient appropriate because: Unsafe discharge, no need nursing home placement.     Time spent: 35 minutes  Author: Marrion Coy, MD 11/25/2022 12:13 PM  For on call review [email or web address].

## 2022-11-25 NOTE — TOC Initial Note (Addendum)
Transition of Care [facility]) - Initial/Assessment Note    Patient Details  Name: Alex Smith MRN: [id number] Date of Birth: 1934/09/14  [facility]) CM/SW Contact:    Allena Katz, LCSW Phone Number: 11/25/2022, 9:00 AM  Clinical Narrative:   Late Entry  CSW spoke with patients son on Friday,  who states [place] from [place] [address] Son requests that status be changed to DNR. Son also agreeable to [facility]. [facility] contacted regarding HH PT/OT/RN who stated they are able to assist once patient discharges.                      Patient Goals and CMS Choice            Expected Discharge Plan and Services                                              Prior Living Arrangements/Services                       Activities of Daily Living Home Assistive Devices/Equipment: Dan Humphreys (specify type) ADL Screening (condition at time of admission) Patient's cognitive ability adequate to safely complete daily activities?: No Is the patient deaf or have difficulty hearing?: No Does the patient have difficulty seeing, even when wearing glasses/contacts?: No Does the patient have difficulty concentrating, remembering, or making decisions?: Yes Patient able to express need for assistance with ADLs?: Yes Does the patient have difficulty dressing or bathing?: Yes Independently performs ADLs?: No Communication: Independent Dressing (OT): Needs assistance Grooming: Needs assistance Feeding: Independent Bathing: Needs assistance Toileting: Needs assistance In/Out Bed: Needs assistance Does the patient have difficulty walking or climbing stairs?: No Weakness of Legs: None Weakness of Arms/Hands: None  Permission Sought/Granted                  Emotional Assessment              Admission diagnosis:  Generalized weakness [R53.1] AKI (acute kidney injury) [N17.9] Acute renal failure superimposed on stage 3a chronic kidney disease [N17.9,  N18.31] Patient Active Problem List   Diagnosis Date Noted   Pancytopenia 11/21/2022   Metabolic acidosis 11/21/2022   Overweight (BMI 25.0-29.9) 11/20/2022   Acute renal failure superimposed on stage 3a chronic kidney disease 11/19/2022   Acute respiratory failure with hypoxia 04/16/2022   Generalized weakness 04/16/2022   Chronic kidney disease, stage 3b 04/16/2022   COVID-19 04/10/2022   Hypercholesterolemia 11/12/2021   Hyperglycemia due to type 2 diabetes mellitus 11/12/2021   Closed compression fracture of L2 lumbar vertebra, initial encounter 08/18/2021   Alzheimer's dementia 08/18/2021   Acute lower UTI leading to weakness and dehydration 08/18/2021   Laceration of head 08/18/2021   Hypoglycemia 06/29/2021   Severe sepsis 05/04/2021   CAP (community acquired pneumonia) 05/02/2021   Vitamin D deficiency 03/10/2019   Depressed mood 03/10/2019   Stage 3a chronic kidney disease (CKD) 11/14/2017   Goals of care, counseling/discussion 05/27/2017   Pancytopenia due to antineoplastic chemotherapy 11/17/2016   Chemotherapy-induced neutropenia 09/22/2016   Urinary incontinence 08/02/2016   Systolic murmur 05/28/2016   Myelodysplastic syndrome, low grade 02/05/2016   Multiple myeloma not having achieved remission 02/05/2016   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   Myelodysplasia present in bone marrow 11/03/2015   Advanced care planning/counseling discussion 05/22/2015   Insomnia 03/03/2015  Monoclonal gammopathy 02/03/2015   Macrocytic anemia 02/03/2015   Obesity (BMI 30-39.9) 10/12/2014   History of nonmelanoma skin cancer 10/10/2014   Medicare annual wellness visit, subsequent 04/12/2014   Health maintenance examination 04/12/2014   Hairy cell leukemia, in remission    B12 deficiency    BPH (benign prostatic hyperplasia)    Essential hypertension    GERD (gastroesophageal reflux disease)    Dyslipidemia    Type 2 diabetes mellitus with stage 3 chronic kidney disease     Diverticulosis    Macular hole 07/07/2012   [facility]  [facility]:   [facility] [place] - [address] [place] [address] Phone: [phone] Fax: [phone]  [facility] [address] [place] [address] Phone: [phone] Fax: [phone]     Social Determinants of Health (SDOH) Social History: SDOH Screenings   Depression (PHQ2-9): Medium Risk (03/09/2019)  Tobacco Use: Medium Risk (11/14/2022)   SDOH Interventions:     Readmission Risk Interventions    04/13/2022    2:31 PM 08/19/2021   10:21 AM 05/11/2021   10:42 AM  Readmission Risk Prevention Plan  Transportation Screening Complete Complete Complete  PCP or Specialist Appt within 3-5 Days Complete    HRI or Home Care Consult Complete    Social Work Consult for Recovery Care Planning/Counseling Complete    Palliative Care Screening Complete    Medication Review Oceanographer(RN Care Manager) Complete Complete Complete  PCP or Specialist appointment within 3-5 days of discharge  Complete Complete  HRI or Home Care Consult  Complete Complete  SW Recovery Care/Counseling Consult  Complete Complete  Palliative Care Screening  Not Applicable Complete  Skilled Nursing Facility  Complete Complete

## 2022-11-25 NOTE — TOC Progression Note (Addendum)
Transition of Care [facility]) - Progression Note    Patient Details  Name: Alex Smith MRN: [id number] Date of Birth: 09-06-1934  Transition of Care [facility]) CM/SW Contact  Margarito Liner, LCSW Phone Number: 11/25/2022, 12:51 PM  Clinical Narrative:   Per PT, recommendations have changed to SNF. Family's preferences are [facility], [facility], and [facility]. Per HCPOA paperwork, son [name] is primary HCPOA. Left him a voicemail. Will start workup once he calls back.  3:18 pm: Son called back and confirmed he is agreeable to SNF placement and that preferences are [facility], [facility], and [facility]. Left message for [facility] admissions coordinator asking her to review.  Expected Discharge Plan and Services                                               Social Determinants of Health (SDOH) Interventions SDOH Screenings   Depression (PHQ2-9): Medium Risk (03/09/2019)  Tobacco Use: Medium Risk (11/14/2022)    Readmission Risk Interventions    04/13/2022    2:31 PM 08/19/2021   10:21 AM 05/11/2021   10:42 AM  Readmission Risk Prevention Plan  Transportation Screening Complete Complete Complete  PCP or Specialist Appt within 3-5 Days Complete    HRI or Home Care Consult Complete    Social Work Consult for Recovery Care Planning/Counseling Complete    Palliative Care Screening Complete    Medication Review Oceanographer) Complete Complete Complete  PCP or Specialist appointment within 3-5 days of discharge  Complete Complete  HRI or Home Care Consult  Complete Complete  SW Recovery Care/Counseling Consult  Complete Complete  Palliative Care Screening  Not Applicable Complete  Skilled Nursing Facility  Complete Complete

## 2022-11-25 NOTE — Progress Notes (Signed)
[facility]  ROUNDING NOTE   Subjective:   Patient seen sitting up in chair Wife at bedside States he feels well today Appetite appropriate Denies nausea and vomiting  Objective:  Vital signs in last 24 hours:  Temp:  [98.1 F (36.7 C)-98.5 F (36.9 C)] 98.5 F (36.9 C) (04/08 0502) Pulse Rate:  [73-81] 75 (04/08 0502) Resp:  [17-20] 20 (04/08 0502) BP: (138-152)/(54-72) 138/72 (04/08 0502) SpO2:  [91 %-94 %] 94 % (04/08 0502)  Weight change:  Filed Weights   11/18/22 2314  Weight: 86.2 kg    Intake/Output: I/O last 3 completed shifts: In: 1958.1 [P.O.:960; I.V.:998.1] Out: 1800 [Urine:1800]   Intake/Output this shift:  Total I/O In: -  Out: 900 [Urine:900]  Physical Exam: General: NAD, Sitting up in chair  Head: Normocephalic, atraumatic. Moist oral mucosal membranes  Eyes: Anicteric   Lungs:  Clear to auscultation  Heart: Regular rate and rhythm  Abdomen:  Soft, nontender,   Extremities:  no peripheral edema.  Neurologic: Nonfocal, moving all four extremities  Skin: No lesions       Basic Metabolic Panel: Recent Labs  Lab 11/20/22 0838 11/21/22 0531 11/22/22 0510 11/23/22 0548 11/24/22 0523 11/25/22 0628  NA 135 135 136 135 135 136  K 4.3 4.2 4.3 4.0 3.9 4.3  CL 107 107 109 103 104 105  CO2 23 21* 21* 24 25 25   GLUCOSE 189* 186* 289* 201* 152* 181*  BUN 28* 33* 37* 35* 31* 32*  CREATININE 2.30* 2.37* 2.51* 2.29* 2.28* 2.11*  CALCIUM 8.2* 8.0* 8.1* 8.0* 7.9* 7.9*  MG 2.1 2.1  --  2.1 2.2  --      Liver Function Tests: Recent Labs  Lab 11/21/22 0932  AST 41  ALT 24  ALKPHOS 72  BILITOT 0.8  PROT 7.2  ALBUMIN 2.9*    No results for input(s): "LIPASE", "AMYLASE" in the last 168 hours. No results for input(s): "AMMONIA" in the last 168 hours.  CBC: Recent Labs  Lab 11/21/22 0531 11/22/22 0510 11/23/22 0548 11/24/22 0523 11/25/22 0628  WBC 5.0 4.4 4.7 3.4* 2.7*  HGB 7.9* 7.5* 7.5* 7.3* 6.9*  HCT 25.1* 24.0*  23.6* 23.1* 21.7*  MCV 99.6 99.2 100.4* 100.0 100.0  PLT 104* 104* 100* 103* 101*     Cardiac Enzymes: No results for input(s): "CKTOTAL", "CKMB", "CKMBINDEX", "TROPONINI" in the last 168 hours.  BNP: Invalid input(s): "POCBNP"  CBG: Recent Labs  Lab 11/24/22 0838 11/24/22 1205 11/24/22 1649 11/24/22 2126 11/25/22 0829  GLUCAP 175* 225* 251* 197* 162*     Microbiology: Results for orders placed or performed during the hospital encounter of 11/19/22  Urine Culture (for pregnant, neutropenic or urologic patients or patients with an indwelling urinary catheter)     Status: None   Collection Time: 11/19/22  1:11 AM   Specimen: Urine, Random  Result Value Ref Range Status   Specimen Description   Final    URINE, RANDOM Performed at [facility], [address], [place], [place] [address]    Special Requests   Final    NONE Performed at [facility], [address] [place], [place] [address]    Culture   Final    NO GROWTH Performed at [facility], [address], [place], [place] [address]    Report Status 11/20/2022 FINAL  Final   *Note: Due to a large number of results and/or encounters for the requested time period, some results have not been  displayed. A complete set of results can be found in Results Review.    Coagulation Studies: No results for input(s): "LABPROT", "INR" in the last 72 hours.  Urinalysis: No results for input(s): "COLORURINE", "LABSPEC", "PHURINE", "GLUCOSEU", "HGBUR", "BILIRUBINUR", "KETONESUR", "PROTEINUR", "UROBILINOGEN", "NITRITE", "LEUKOCYTESUR" in the last 72 hours.  Invalid input(s): "APPERANCEUR"    Imaging: No results found.   Medications:      sodium chloride   Intravenous Once   amLODipine  10 mg Oral Daily   aspirin EC  81 mg Oral Daily   enoxaparin (LOVENOX) injection  30 mg Subcutaneous Q24H   insulin aspart  0-15 Units Subcutaneous TID WC   insulin aspart  0-5 Units Subcutaneous QHS    melatonin  2.5 mg Oral QHS   pantoprazole  40 mg Oral BID   pravastatin  20 mg Oral QPM   QUEtiapine  25 mg Oral QHS   sertraline  25 mg Oral Daily   tamsulosin  0.4 mg Oral Daily   acetaminophen **OR** acetaminophen, ondansetron **OR** ondansetron (ZOFRAN) IV  Assessment/ Plan:  [name] is a 88 y.o.  male with Alzheimer's dementia, BPH, Hairy cell leukemia, hypertension, hyperlipidemia, diabetes mellitus type II, diabetic retinopathy, , who was admitted to [facility] on 11/19/2022 for Generalized weakness [R53.1] AKI (acute kidney injury) [N17.9] Acute renal failure superimposed on stage 3a chronic kidney disease [N17.9, N18.31]   Acute kidney injury: on chronic kidney disease stage IIIA with baseline creatinine of 1.45, GFR of 47 on 08/27/2022. Patient with history of hematuria, proteinuria and glycosuria. Consistent with diabetic nephropathy. Acute kidney injury most likely secondary to prerenal azotemia. Not on ACE-I/ARB, no diuretic, no SGLT-2 inhibitor as outpatient. No obstruction on ultrasound. No IV contrast exposure. No exposure to any other nephrotoxic agents. - Creatinine continues to improve - Encouraged to maintain oral intake.    Diabetes mellitus type II with chronic kidney disease and renal manifestations. Insulin dependent. Not on SGLT-2 or ACE-I/ARB   Hypertension with chronic kidney disease: also takes doxazosin at home.  - on amlodipine and tamsulosin.  - Blood pressure stable   Anemia with chronic kidney disease, with history of smoldering myeloma and remission of Hairy cell leukemia. Followed by Dr. Smith Robert, [facility]. Hemoglobin 7.5. with thrombocytopenia. Appreciate hematology input.    LOS: 5   4/8/20242:21 PM

## 2022-11-25 NOTE — Care Management Important Message (Signed)
Important Message  Patient Details  Name: Alex Smith MRN: [id number] Date of Birth: September 12, 1934   Medicare Important Message Given:  Yes     [name] 11/25/2022, 11:06 AM

## 2022-11-25 NOTE — Inpatient Diabetes Management (Signed)
Inpatient Diabetes Program Recommendations  AACE/ADA: New Consensus Statement on Inpatient Glycemic Control (2015)  Target Ranges:  Prepandial:   less than 140 mg/dL      [facility] postprandial:   less than 180 mg/dL (1-2 hours)      Critically ill patients:  140 - 180 mg/dL    Latest Reference Range & Units 11/24/22 08:38 11/24/22 12:05 11/24/22 16:49 11/24/22 21:26  Glucose-Capillary 70 - 99 mg/dL 623 (H)  3 units Novolog  225 (H)  5 units Novolog  251 (H)  8 units Novolog  197 (H)  (H): Data is abnormally high  Latest Reference Range & Units 11/25/22 08:29  Glucose-Capillary 70 - 99 mg/dL 762 (H)  (H): Data is abnormally high     Home DM Meds: Lantus 22 units QHS        Humalog 0-8 units TID per SSI  Current Orders: Novolog Moderate Correction Scale/ SSI (0-15 units) TID AC + HS   MD- Note afternoon CBG elevations yesterday.  NPO this AM for bone marrow biopsy.  When pt resumes PO diet later today, may consider adding Low Dose Novolog Meal Coverage: Novolog 3 units TID with meals HOLD if pt NPO HOLD if pt eats <50% meals    --Will follow patient during hospitalization--  Ambrose Finland RN, MSN, CDCES Diabetes Coordinator Inpatient Glycemic Control Team Team Pager: [phone] (8a-5p)

## 2022-11-25 NOTE — [place] FL2 (Signed)
[place] MEDICAID FL2 LEVEL OF CARE FORM     IDENTIFICATION  Patient Name: Alex Smith Birthdate: 07/10/1935 Sex: male Admission Date (Current Location): 11/19/2022  [place]   Facility and Address:  [facility], [address], [place], [place] [address]      Provider Number: [id number]  Attending Physician Name and Address:  Marrion CoyZhang, Dekui, MD  Relative Name and Phone Number:       Current Level of Care: Hospital Recommended Level of Care: Skilled Nursing Facility Prior Approval Number:    Date Approved/Denied:   PASRR Number: [id number]  Discharge Plan: SNF    Current Diagnoses: Patient Active Problem List   Diagnosis Date Noted   Pancytopenia 11/21/2022   Metabolic acidosis 11/21/2022   Overweight (BMI 25.0-29.9) 11/20/2022   Acute renal failure superimposed on stage 3a chronic kidney disease 11/19/2022   Acute respiratory failure with hypoxia 04/16/2022   Generalized weakness 04/16/2022   Chronic kidney disease, stage 3b 04/16/2022   COVID-19 04/10/2022   Hypercholesterolemia 11/12/2021   Hyperglycemia due to type 2 diabetes mellitus 11/12/2021   Closed compression fracture of L2 lumbar vertebra, initial encounter 08/18/2021   Alzheimer's dementia 08/18/2021   Acute lower UTI leading to weakness and dehydration 08/18/2021   Laceration of head 08/18/2021   Hypoglycemia 06/29/2021   Severe sepsis 05/04/2021   CAP (community acquired pneumonia) 05/02/2021   Vitamin D deficiency 03/10/2019   Depressed mood 03/10/2019   Stage 3a chronic kidney disease (CKD) 11/14/2017   Goals of care, counseling/discussion 05/27/2017   Pancytopenia due to antineoplastic chemotherapy 11/17/2016   Chemotherapy-induced neutropenia 09/22/2016   Urinary incontinence 08/02/2016   Systolic murmur 05/28/2016   Myelodysplastic syndrome, low grade 02/05/2016   Multiple myeloma not having achieved remission 02/05/2016    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   Myelodysplasia present in bone marrow 11/03/2015   Advanced care planning/counseling discussion 05/22/2015   Insomnia 03/03/2015   Monoclonal gammopathy 02/03/2015   Macrocytic anemia 02/03/2015   Obesity (BMI 30-39.9) 10/12/2014   History of nonmelanoma skin cancer 10/10/2014   Medicare annual wellness visit, subsequent 04/12/2014   Health maintenance examination 04/12/2014   Hairy cell leukemia, in remission    B12 deficiency    BPH (benign prostatic hyperplasia)    Essential hypertension    GERD (gastroesophageal reflux disease)    Dyslipidemia    Type 2 diabetes mellitus with stage 3 chronic kidney disease    Diverticulosis    Macular hole 07/07/2012    Orientation RESPIRATION BLADDER Height & Weight     Self, Place  Normal Incontinent, External catheter Weight: 190 lb (86.2 kg) Height:  5\' 7"  (170.2 cm)  BEHAVIORAL SYMPTOMS/MOOD NEUROLOGICAL BOWEL NUTRITION STATUS     (Alzheimer's Dementia) Continent Diet (Heart healthy)  AMBULATORY STATUS COMMUNICATION OF NEEDS Skin   Limited Assist Verbally Normal                       Personal Care Assistance Level of Assistance  Bathing, Feeding, Dressing Bathing Assistance: Limited assistance Feeding assistance: Limited assistance Dressing Assistance: Limited assistance     Functional Limitations Info  Sight, Hearing, Speech Sight Info: Adequate Hearing Info: Adequate Speech Info: Adequate    SPECIAL CARE FACTORS FREQUENCY  PT (By licensed PT), OT (By licensed OT)     PT Frequency: 5 x week OT Frequency: 5 x week            Contractures Contractures Info: Not present  Additional Factors Info  Code Status, Allergies Code Status Info: DNR Allergies Info: Rituximab, Blood-group Specific Substance, Primaxin (Imipenem), Voriconazole, Sulfa Antibiotics, Sulfacetamide Sodium           Current Medications (11/25/2022):  This is the current hospital active medication  list Current Facility-Administered Medications  Medication Dose Route Frequency Provider Last Rate Last Admin   0.9 %  sodium chloride infusion (Manually program via Guardrails IV Fluids)   Intravenous Once Marrion Coy, MD       acetaminophen (TYLENOL) tablet 650 mg  650 mg Oral Q6H PRN Andris Baumann, MD   650 mg at 11/23/22 2150   Or   acetaminophen (TYLENOL) suppository 650 mg  650 mg Rectal Q6H PRN Andris Baumann, MD       amLODipine (NORVASC) tablet 10 mg  10 mg Oral Daily Andris Baumann, MD   10 mg at 11/24/22 0851   aspirin EC tablet 81 mg  81 mg Oral Daily Lindajo Royal V, MD   81 mg at 11/24/22 0850   enoxaparin (LOVENOX) injection 30 mg  30 mg Subcutaneous Q24H Lindajo Royal V, MD   30 mg at 11/25/22 0938   insulin aspart (novoLOG) injection 0-15 Units  0-15 Units Subcutaneous TID WC Andris Baumann, MD   8 Units at 11/24/22 1655   insulin aspart (novoLOG) injection 0-5 Units  0-5 Units Subcutaneous QHS Andris Baumann, MD   3 Units at 11/22/22 2210   melatonin tablet 2.5 mg  2.5 mg Oral QHS Lindajo Royal V, MD   2.5 mg at 11/24/22 2159   ondansetron (ZOFRAN) tablet 4 mg  4 mg Oral Q6H PRN Andris Baumann, MD       Or   ondansetron [facility]) injection 4 mg  4 mg Intravenous Q6H PRN Andris Baumann, MD       pantoprazole (PROTONIX) EC tablet 40 mg  40 mg Oral BID Lindajo Royal V, MD   40 mg at 11/24/22 2159   pravastatin (PRAVACHOL) tablet 20 mg  20 mg Oral QPM Lindajo Royal V, MD   20 mg at 11/24/22 1656   QUEtiapine (SEROQUEL) tablet 25 mg  25 mg Oral QHS Marrion Coy, MD   25 mg at 11/24/22 2159   sertraline (ZOLOFT) tablet 25 mg  25 mg Oral Daily Andris Baumann, MD   25 mg at 11/24/22 0850   tamsulosin (FLOMAX) capsule 0.4 mg  0.4 mg Oral Daily Andris Baumann, MD   0.4 mg at 11/24/22 1610   Facility-Administered Medications Ordered in Other Encounters  Medication Dose Route Frequency Provider Last Rate Last Admin   heparin lock flush 100 unit/mL  500 Units Intravenous Once  Corcoran, Melissa C, MD       luspatercept-aamt (REBLOZYL) subcutaneous injection 100 mg  100 mg Subcutaneous Q21 days Creig Hines, MD   100 mg at 08/27/22 1206   sodium chloride 0.9 % injection 10 mL  10 mL Intravenous PRN Nelva Nay C, MD   10 mL at 03/03/15 0903   sodium chloride 0.9 % injection 10 mL  10 mL Intracatheter PRN Nelva Nay C, MD   10 mL at 03/10/15 1410   sodium chloride flush (NS) 0.9 % injection 10 mL  10 mL Intravenous PRN Rosey Bath, MD   10 mL at 07/23/18 1517     Discharge Medications: Please see discharge summary for a list of discharge medications.  Relevant Imaging Results:  Relevant Lab Results:  Additional Information SS#: [id number]. From [facility].  Margarito Liner, LCSW

## 2022-11-26 DIAGNOSIS — D649 Anemia, unspecified: Secondary | ICD-10-CM | POA: Diagnosis not present

## 2022-11-26 DIAGNOSIS — N1831 Chronic kidney disease, stage 3a: Secondary | ICD-10-CM | POA: Diagnosis not present

## 2022-11-26 DIAGNOSIS — R531 Weakness: Secondary | ICD-10-CM | POA: Diagnosis not present

## 2022-11-26 DIAGNOSIS — N178 Other acute kidney failure: Secondary | ICD-10-CM | POA: Diagnosis not present

## 2022-11-26 DIAGNOSIS — D61818 Other pancytopenia: Secondary | ICD-10-CM | POA: Diagnosis not present

## 2022-11-26 LAB — BPAM RBC
Blood Product Expiration Date: 202404090921
Unit Type and Rh: 600

## 2022-11-26 LAB — BASIC METABOLIC PANEL
Anion gap: 6 (ref 5–15)
BUN: 30 mg/dL — ABNORMAL HIGH (ref 8–23)
CO2: 27 mmol/L (ref 22–32)
Calcium: 7.9 mg/dL — ABNORMAL LOW (ref 8.9–10.3)
Chloride: 104 mmol/L (ref 98–111)
Creatinine, Ser: 1.89 mg/dL — ABNORMAL HIGH (ref 0.61–1.24)
GFR, Estimated: 34 mL/min — ABNORMAL LOW (ref >60)
Glucose, Bld: 195 mg/dL — ABNORMAL HIGH (ref 70–99)
Potassium: 4 mmol/L (ref 3.5–5.1)
Sodium: 137 mmol/L (ref 135–145)

## 2022-11-26 LAB — CBC
HCT: 26.4 % — ABNORMAL LOW (ref 39.0–52.0)
Hemoglobin: 8.3 g/dL — ABNORMAL LOW (ref 13.0–17.0)
MCH: 30.9 pg (ref 26.0–34.0)
MCHC: 31.4 g/dL (ref 30.0–36.0)
MCV: 98.1 fL (ref 80.0–100.0)
Platelets: 116 10*3/uL — ABNORMAL LOW (ref 150–400)
RBC: 2.69 MIL/uL — ABNORMAL LOW (ref 4.22–5.81)
RDW: 18 % — ABNORMAL HIGH (ref 11.5–15.5)
WBC: 3.6 10*3/uL — ABNORMAL LOW (ref 4.0–10.5)
nRBC: 0 % (ref 0.0–0.2)

## 2022-11-26 LAB — GLUCOSE, CAPILLARY
Glucose-Capillary: 198 mg/dL — ABNORMAL HIGH (ref 70–99)
Glucose-Capillary: 295 mg/dL — ABNORMAL HIGH (ref 70–99)
Glucose-Capillary: 244 mg/dL — ABNORMAL HIGH (ref 70–99)
Glucose-Capillary: 124 mg/dL — ABNORMAL HIGH (ref 70–99)

## 2022-11-26 LAB — TYPE AND SCREEN: Unit division: 0

## 2022-11-26 MED ORDER — HYDRALAZINE HCL 20 MG/ML IJ SOLN: 10.0000 mg | Freq: Four times a day (QID) | INTRAMUSCULAR | Status: DC | PRN

## 2022-11-26 MED ORDER — LABETALOL HCL 5 MG/ML IV SOLN: 20.0000 mg | INTRAVENOUS | Status: DC | PRN

## 2022-11-26 NOTE — Progress Notes (Signed)
Progress Note   Patient: Alex Smith HGD:[id number] DOB: Aug 14, 1935 DOA: 11/19/2022     6 DOS: the patient was seen and examined on 11/26/2022   Brief hospital course: Alex Smith is a 88 y.o. male with medical history significant for HTN, smoldering myeloma, hairy cell leukemia, MDS on Luspatercept for anemia, followed by Dr. Smith Robert of oncology, as well as insulin-dependent type 2 diabetes, BPH, CKD 3A, Alzheimer's dementia, HTN, who was sent to the ED from ALF for evaluation of weakness and dysuria and concern for UTI.  Patient has been constipated, nausea, reduced liquid intake, he has acute on chronic renal failure.  Patient renal function does not improve after giving fluids, more anemic.  Consult obtained from oncology as well as nephrology. Hemoglobin dropped down to 6.9 on 4/8, given 1 unit PRBC.  Renal function improved after fluids. Patient is seen by PT/OT, recommended nursing home placement.  Bone marrow biopsy scheduled 4/10.    Principal Problem:   Generalized weakness Active Problems:   Acute renal failure superimposed on stage 3a chronic kidney disease   BPH (benign prostatic hyperplasia)   Alzheimer's dementia   Essential hypertension   Type 2 diabetes mellitus with stage 3 chronic kidney disease   Hairy cell leukemia, in remission   Monoclonal gammopathy   Smoldering myeloma   Overweight (BMI 25.0-29.9)   Pancytopenia   Metabolic acidosis   Assessment and Plan: Acute renal failure superimposed on stage 3a chronic kidney disease Mild metabolic acidosis. Appears to be prerenal, due to poor p.o. intake.  Received fluids, renal function is back to baseline, metabolic acidosis resolved.   Hairy cell leukemia, in remission Smoldering myeloma MDS Pancytopenia with anemia and thrombocytopenia. Patient has been seen by oncology, planning for bone marrow biopsy. Hemoglobin 6.9 on 4/8, transfused 1 unit PRBC.  Bone marrow biopsy is scheduled on Wednesday.      Generalized weakness Physical deconditioning. Patient has no evidence of urinary tract infection. Patient had a worsening weakness, pending nursing home placement.     BPH (benign prostatic hyperplasia) Continue tamsulosin   Alzheimer's dementia with delirium. Patient was treated with lower dose Seroquel, slept much better.  No longer has any agitation.     Type 2 diabetes mellitus with stage 3 chronic kidney disease Sliding scale insulin coverage   Essential hypertension Continue amlodipine      Subjective:  Patient slept well last night, still has significant weakness.  No shortness of breath.  Physical Exam: Vitals:   11/25/22 1807 11/25/22 1922 11/26/22 0430 11/26/22 0815  BP: (!) 157/76 (!) 151/70 (!) 172/60 (!) 173/67  Pulse: 74 77 80 83  Resp:  18 16 18   Temp: 98 F (36.7 C) 98.2 F (36.8 C) 98.4 F (36.9 C) 98.4 F (36.9 C)  TempSrc: Oral Oral    SpO2: 95% 95% 93% 93%  Weight:      Height:       General exam: Appears calm and comfortable  Respiratory system: Clear to auscultation. Respiratory effort normal. Cardiovascular system: S1 & S2 heard, RRR. No JVD, murmurs, rubs, gallops or clicks. No pedal edema. Gastrointestinal system: Abdomen is nondistended, soft and nontender. No organomegaly or masses felt. Normal bowel sounds heard. Central nervous system: Alert and oriented. No focal neurological deficits. Extremities: Symmetric 5 x 5 power. Skin: No rashes, lesions or ulcers Psychiatry: Judgement and insight appear normal. Mood & affect appropriate.    Data Reviewed:  Lab results reviewed.  Family Communication: None  Disposition: Status is:  Inpatient Remains inpatient appropriate because: Severity of disease, inpatient procedure.  Unsafe discharge.     Time spent: 35 minutes  Author: Marrion Coyekui Xzaria Teo, MD 11/26/2022 3:08 PM  For on call review [email or web address].

## 2022-11-26 NOTE — Consult Note (Signed)
Chief Complaint: Patient was seen in consultation today for image-guided bone marrow biopsy  Referring Physician(s): Owens SharkArchana Rao, MD  Supervising Physician: Ruel FavorsShick, Trevor  Patient Status: [facility] - In-pt  History of Present Illness: Alex Smith is a 88 y.o. male with PMH significant for Alzheimer's dementia, anemia, CKD stage 3, Type 2 diabetes mellitus, GERD, hairy cell leukemia, smoldering myeloma, and hypertension being seen today for image-guided bone marrow biopsy. The patient has been experiencing anemia, with concern for possible progression of smoldering myeloma into multiple myeloma. Dr Smith Robertao from Oncology is following. Request received for image-guided bone marrow biopsy.  Past Medical History:  Diagnosis Date   Alzheimer's dementia (HCC) 08/18/2021   Anemia    B12 deficiency    Basal cell carcinoma 03/30/2018   left ant nasal ala   Basal cell carcinoma of face 01/19/2013   right distal dorsum nose   BPH (benign prostatic hypertrophy)    followed by urology, discharged (Dr. Lonna CobbStoioff)   CAP (community acquired pneumonia) 02/15/2015   CKD stage 3 due to type 2 diabetes mellitus (HCC) 11/14/2017   Colon polyps    Diverticulosis    Dysplastic nevus 10/27/2006   right med calf   GERD (gastroesophageal reflux disease)    Hairy cell leukemia (HCC) 2006   recurrent, seizure on rituxan, now on cladribine [name](Corcoran)   History of pneumonia 2000's   "once" (07/07/2012)   History of shingles    HLD (hyperlipidemia)    Hypertension    Pneumonia    Shortness of breath dyspnea    Squamous cell carcinoma in situ 07/05/2020   Left lat. pretibial. 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 EDC 09/07/2020   Squamous cell carcinoma of skin 04/03/2020   Left cheek. WD SCC   Systolic murmur 05/28/2016   Type 2 diabetes, controlled, with retinopathy (HCC)     Past Surgical History:  Procedure Laterality Date   25 GAUGE PARS PLANA  VITRECTOMY WITH 20 GAUGE MVR PORT FOR MACULAR HOLE  07/07/2012   Procedure: 25 GAUGE PARS PLANA VITRECTOMY WITH 20 GAUGE MVR PORT FOR MACULAR HOLE;  Surgeon: Sherrie GeorgeJohn D Matthews, MD;  [facility];  Service: Ophthalmology;  Laterality: Left;   BONE MARROW BIOPSY  2016   CARDIOVASCULAR STRESS TEST  2013   treadmill - no evidence ischemia, EF 61%   CATARACT EXTRACTION W/ INTRAOCULAR LENS  IMPLANT, BILATERAL  ~ 2010   COLONOSCOPY  2014   Elliot WNL no rpt needed, h/o polyps   EYE SURGERY Left 06/2012   laser surgery   GAS INSERTION  07/07/2012   Procedure: INSERTION OF GAS;  Surgeon: Sherrie GeorgeJohn D Matthews, MD;  [facility];  Service: Ophthalmology;  Laterality: Left;  C3F8   PERIPHERAL VASCULAR CATHETERIZATION N/A 02/23/2015   Procedure: Shelda PalPorta Cath Insertion;  Surgeon: Annice NeedyJason S Dew, MD;  Location: [facility] INVASIVE CV LAB;  Service: Cardiovascular;  Laterality: N/A;   PORTA CATH REMOVAL N/A 02/16/2020   Procedure: PORTA CATH REMOVAL;  Surgeon: Annice Needyew, Jason S, MD;  Location: [facility] INVASIVE CV LAB;  Service: Cardiovascular;  Laterality: N/A;   SERUM PATCH  07/07/2012   Procedure: SERUM PATCH;  Surgeon: Sherrie GeorgeJohn D Matthews, MD;  [facility];  Service: Ophthalmology;  Laterality: Left;   SKIN CANCER EXCISION     "all over my face" (07/07/2012)    Allergies: Rituximab, Blood-group specific substance, Primaxin [imipenem], Voriconazole, Sulfa antibiotics, and Sulfacetamide sodium  Medications: Prior to Admission medications  Medication Sig Start Date End Date Taking? Authorizing Provider  acetaminophen (TYLENOL) 500 MG tablet Take 1,000 mg by mouth 2 (two) times daily as needed for moderate pain.   Yes [provider]  albuterol (VENTOLIN HFA) 108 (90 Base) MCG/ACT inhaler Inhale 2 puffs into the lungs every 6 (six) hours as needed for wheezing or shortness of breath. 04/20/22  Yes Lynn Ito, MD  alum & mag hydroxide-simeth (MAALOX/MYLANTA) 200-200-20 MG/5ML suspension Take 30 mLs by mouth every 4  (four) hours as needed for indigestion or heartburn. 04/20/22  Yes Lynn Ito, MD  amLODipine (NORVASC) 10 MG tablet Take 1 tablet (10 mg total) by mouth daily. 04/20/22  Yes Lynn Ito, MD  ARICEPT 5 MG tablet Take 5 mg by mouth daily. 11/08/22  Yes [provider]  aspirin EC 81 MG tablet Take 81 mg by mouth daily.   Yes [provider]  Dextromethorphan-guaiFENesin (MUCINEX DM) 30-600 MG TB12 Take 2 tablets by mouth 2 (two) times daily. 10/25/22  Yes [provider]  famotidine (PEPCID) 20 MG tablet Take 20 mg by mouth daily.   Yes [provider]  insulin glargine (LANTUS) 100 UNIT/ML Solostar Pen Inject 22 Units into the skin at bedtime.   Yes [provider]  insulin lispro (HUMALOG) 100 UNIT/ML KwikPen Inject 0-8 Units into the skin 3 (three) times daily before meals. Per sliding scale   Yes [provider]  lisinopril (ZESTRIL) 5 MG tablet Take 5 mg by mouth daily.   Yes [provider]  melatonin 5 MG TABS Take 5 mg by mouth at bedtime.   Yes [provider]  Multiple Vitamin (MULTIVITAMIN WITH MINERALS) TABS tablet Take 1 tablet by mouth daily. 08/23/21  Yes Lanae Boast, MD  pantoprazole (PROTONIX) 40 MG tablet Take 1 tablet (40 mg total) by mouth 2 (two) times daily. 04/20/22  Yes Lynn Ito, MD  pravastatin (PRAVACHOL) 20 MG tablet TAKE ONE TABLET EVERY EVENING Patient taking differently: Take 20 mg by mouth at bedtime. 03/16/20  Yes Eustaquio Boyden, MD  sertraline (ZOLOFT) 25 MG tablet Take 25 mg by mouth daily.   Yes [provider]  tamsulosin (FLOMAX) 0.4 MG CAPS capsule TAKE 1 CAPSULE BY MOUTH EVERY DAY Patient taking differently: Take 0.4 mg by mouth daily. 12/11/17  Yes Eustaquio Boyden, MD  vitamin B-12 (CYANOCOBALAMIN) 1000 MCG tablet Take 1 tablet (1,000 mcg total) by mouth daily. 06/01/19  Yes Eustaquio Boyden, MD  vitamin E 400 UNIT capsule Take 400 Units by mouth daily.   Yes [provider]  doxazosin (CARDURA) 1 MG tablet TAKE ONE TABLET BY MOUTH EVERY DAY Patient not taking: Reported on 08/27/2022 08/31/19   Eustaquio Boyden, MD  feeding supplement (ENSURE ENLIVE / ENSURE PLUS) LIQD Take 237 mLs by mouth 2 (two) times daily between meals. Patient not taking: Reported on 08/27/2022 08/22/21   Lanae Boast, MD  sitaGLIPtin (JANUVIA) 50 MG tablet Take 50 mg by mouth daily. Patient not taking: Reported on 11/22/2022    [provider]     Family History  Problem Relation Age of Onset   Dementia Mother    Heart failure Father 57   Cancer Sister        breast   Diabetes Paternal Uncle    Diabetes Paternal Aunt    CAD Brother 9       MI   Stroke Neg Hx     Social History   Socioeconomic History   Marital status: Married  Spouse name: Not on file   Number of children: Not on file   Years of education: Not on file   Highest education level: Not on file  Occupational History   Not on file  Tobacco Use   Smoking status: Former    Packs/day: 2.00    Years: 25.00    Additional pack years: 0.00    Total pack years: 50.00    Types: Cigarettes    Quit date: 02/06/1969    Years since quitting: 53.8   Smokeless tobacco: Never   Tobacco comments:    07/07/2012 "stopped smoking ~ 40 yr ago; smoked 20-35yr"  Vaping Use   Vaping Use: Never used  Substance and Sexual Activity   Alcohol use: Not Currently   Drug use: No   Sexual activity: Never  Other Topics Concern   Not on file  Social History Narrative   Lives at home alone. 1 cat   Occupation: was Tree surgeon, works part time for [employer]   Activity: likes to golf   Diet: moderate water, fruits/vegetables daily   Social Determinants of Corporate investment banker Strain: Not on file  Food Insecurity: Not on file  Transportation Needs: Not on file  Physical Activity: Not on file  Stress: Not on file  Social Connections: Not on file    Code Status:   Patient currently has DNR order in  place. Discussion with the patient and family regarding wishes.  The DNR order is rescinded during the procedure and the patient consents to the use of any resuscitation procedure needed to treat the clinical events that occur.   Review of Systems: A 12 point ROS discussed and pertinent positives are indicated in the HPI above.  All other systems are negative.  Review of Systems  Constitutional:  Positive for fatigue. Negative for chills and fever.  Respiratory:  Negative for chest tightness and shortness of breath.   Cardiovascular:  Negative for chest pain and leg swelling.  Gastrointestinal:  Positive for diarrhea. Negative for abdominal pain, nausea and vomiting.  Neurological:  Positive for dizziness. Negative for headaches.  Psychiatric/Behavioral:  Negative for confusion.     Vital Signs: BP (!) 173/67 (BP Location: Left Arm)   Pulse 83   Temp 98.4 F (36.9 C)   Resp 18   Ht 5\' 7"  (1.702 m)   Wt 190 lb (86.2 kg)   SpO2 93%   BMI 29.76 kg/m    Physical Exam Vitals reviewed.  Constitutional:      General: He is not in acute distress. Cardiovascular:     Rate and Rhythm: Normal rate and regular rhythm.     Pulses: Normal pulses.     Heart sounds: Normal heart sounds.  Pulmonary:     Effort: Pulmonary effort is normal.     Breath sounds: Normal breath sounds.  Abdominal:     General: Bowel sounds are normal.     Palpations: Abdomen is soft.     Tenderness: There is no abdominal tenderness.  Musculoskeletal:     Right lower leg: No edema.     Left lower leg: No edema.  Skin:    General: Skin is warm and dry.  Neurological:     Mental Status: He is alert and oriented to person, place, and time.  Psychiatric:        Mood and Affect: Mood normal.        Behavior: Behavior normal.     Imaging: US RENAL  Result  Date: 11/19/2022 CLINICAL DATA:  Acute on chronic renal insufficiency EXAM: RENAL / URINARY TRACT ULTRASOUND COMPLETE COMPARISON:  04/09/2022 FINDINGS:  Right Kidney: Renal measurements: 10.2 x 6.1 x 4.8 cm = volume: 155.6 mL. Echogenicity within normal limits. Benign simple cortical cyst measuring 1.2 cm upper pole. No hydronephrosis or nephrolithiasis. Left Kidney: Renal measurements: 12.7 x 6.5 x 5.8 cm = volume: 240.9 mL. Echogenicity within normal limits. 1.1 cm simple cyst upper pole left kidney. The larger simple cysts seen posteriorly off the left kidney on prior CT is not well visualized. No hydronephrosis or nephrolithiasis. Bladder: Appears normal for degree of bladder distention. Other: Incidental cholelithiasis.  No evidence of cholecystitis. IMPRESSION: 1. Unremarkable appearance of the bilateral kidneys. 2. Incidental cholelithiasis.  No evidence of cholecystitis. Electronically Signed   By: Sharlet Salina M.D.   On: 11/19/2022 18:22   DG Chest 2 View  Result Date: 11/18/2022 CLINICAL DATA:  Generalized weakness EXAM: CHEST - 2 VIEW COMPARISON:  04/09/2022 FINDINGS: Cardiac and mediastinal contours are unchanged. No focal pulmonary opacity. No pleural effusion or pneumothorax. No acute osseous abnormality. IMPRESSION: No acute cardiopulmonary process. Electronically Signed   By: Wiliam Ke M.D.   On: 11/18/2022 23:48    Labs:  CBC: Recent Labs    11/23/22 0548 11/24/22 0523 11/25/22 0628 11/26/22 0451  WBC 4.7 3.4* 2.7* 3.6*  HGB 7.5* 7.3* 6.9* 8.3*  HCT 23.6* 23.1* 21.7* 26.4*  PLT 100* 103* 101* 116*    COAGS: Recent Labs    04/09/22 2150  INR 1.0  APTT 32    BMP: Recent Labs    11/23/22 0548 11/24/22 0523 11/25/22 0628 11/26/22 0451  NA 135 135 136 137  K 4.0 3.9 4.3 4.0  CL 103 104 105 104  CO2 24 25 25 27   GLUCOSE 201* 152* 181* 195*  BUN 35* 31* 32* 30*  CALCIUM 8.0* 7.9* 7.9* 7.9*  CREATININE 2.29* 2.28* 2.11* 1.89*  GFRNONAA 27* 27* 30* 34*    LIVER FUNCTION TESTS: Recent Labs    04/15/22 0448 07/23/22 1326 08/27/22 0948 11/21/22 0932  BILITOT 1.0 0.7 0.8 0.8  AST 26 24 18  41  ALT 33 16  10 24   ALKPHOS 54 68 81 72  PROT 6.5 7.1 6.9 7.2  ALBUMIN 3.1* 3.3* 3.6 2.9*    TUMOR MARKERS: No results for input(s): "AFPTM", "CEA", "CA199", "CHROMGRNA" in the last 8760 hours.  Assessment and Plan:   Alex Smith is an 88 yo male being seen today in evaluation for image-guided bone marrow biopsy. Patient is fatigued but reports feeling well otherwise. There is concern that patient's smoldering myeloma has progressed into multiple myeloma. Case has been reviewed and is tentatively scheduled for the morning of 11/27/22.   Risks and benefits of image-guided bone marrow biopsy was discussed with the patient and/or patient's family including, but not limited to bleeding, infection, damage to adjacent structures or low yield requiring additional tests.  All of the questions were answered and there is agreement to proceed.  Consent signed and in IR suite.   Thank you for this interesting consult.  I greatly enjoyed meeting Alex Smith and look forward to participating in their care.  A copy of this report was sent to the requesting provider on this date.  Electronically Signed: Kennieth Francois, PA-C 11/26/2022, 3:09 PM   I spent a total of 40 Minutes  in face to face in clinical consultation, greater than 50% of which was counseling/coordinating care for bone  marrow biopsy

## 2022-11-26 NOTE — TOC Progression Note (Addendum)
Transition of Care [facility]) - Progression Note    Patient Details  Name: Alex Smith MRN: [id number] Date of Birth: 05/22/1935  Transition of Care [facility]) CM/SW Contact  Margarito Liner, LCSW Phone Number: 11/26/2022, 11:40 AM  Clinical Narrative:   Per oncology, no plans for radiation and no plans for chemo right now. Patient gets Reblozyl injection every 3 weeks but is currently overdue. Oncology is waiting on bone marrow biopsy results before deciding when to restart. [facility] is checking to see if they can accept patient on Reblozyl. Per [employer], may have to hold that medication during rehab due to cost.   1:55 pm: Gave son bed offers. He is going to call his sister to see discuss which one they want to accept. He is aware that patient will have to hold injection while in rehab. Son wants to make sure patient gets his bone marrow biopsy before he leaves.  2:51 pm: Son and daughter accepted bed offer from [facility]. Started English as a second language teacher.  4:50 pm: Auth approved for SNF [phone]) and EMS [phone]).  Expected Discharge Plan and Services                                               Social Determinants of Health (SDOH) Interventions SDOH Screenings   Depression (PHQ2-9): Medium Risk (03/09/2019)  Tobacco Use: Medium Risk (11/14/2022)    Readmission Risk Interventions    04/13/2022    2:31 PM 08/19/2021   10:21 AM 05/11/2021   10:42 AM  Readmission Risk Prevention Plan  Transportation Screening Complete Complete Complete  PCP or Specialist Appt within 3-5 Days Complete    HRI or Home Care Consult Complete    Social Work Consult for Recovery Care Planning/Counseling Complete    Palliative Care Screening Complete    Medication Review Oceanographer) Complete Complete Complete  PCP or Specialist appointment within 3-5 days of discharge  Complete Complete  HRI or Home Care Consult  Complete Complete  SW Recovery Care/Counseling Consult   Complete Complete  Palliative Care Screening  Not Applicable Complete  Skilled Nursing Facility  Complete Complete

## 2022-11-26 NOTE — Inpatient Diabetes Management (Signed)
Inpatient Diabetes Program Recommendations  AACE/ADA: New Consensus Statement on Inpatient Glycemic Control (2015)  Target Ranges:  Prepandial:   less than 140 mg/dL      [facility] postprandial:   less than 180 mg/dL (1-2 hours)      Critically ill patients:  140 - 180 mg/dL    Latest Reference Range & Units 11/25/22 08:29 11/25/22 17:26 11/25/22 21:13  Glucose-Capillary 70 - 99 mg/dL 466 (H) 599 (H)  8 units Novolog  152 (H)  (H): Data is abnormally high  Latest Reference Range & Units 11/26/22 08:13 11/26/22 11:57  Glucose-Capillary 70 - 99 mg/dL 357 (H)  3 units Novolog  295 (H)  8 units Novolog   (H): Data is abnormally high   Home DM Meds: Lantus 22 units QHS                              Humalog 0-8 units TID per SSI   Current Orders: Novolog Moderate Correction Scale/ SSI (0-15 units) TID AC + HS     MD- Please consider adding Low Dose Novolog Meal Coverage:  Novolog 4 units TID with meals HOLD if pt NPO HOLD if pt eats <50% meals       --Will follow patient during hospitalization--   Ambrose Finland RN, MSN, CDCES Diabetes Coordinator Inpatient Glycemic Control Team Team Pager: [phone] (8a-5p)

## 2022-11-26 NOTE — Progress Notes (Signed)
[facility]  ROUNDING NOTE   Subjective:   Patient seen resting quietly in bed No family at bedside Alert and oriented Tolerating meals without nausea and vomiting Remains on room air No lower extremity edema  Creatinine 1.89 from 2.11 Urine output 1.95 L in preceding 24 hours.  Objective:  Vital signs in last 24 hours:  Temp:  [97.8 F (36.6 C)-98.4 F (36.9 C)] 98.4 F (36.9 C) (04/09 0815) Pulse Rate:  [73-83] 83 (04/09 0815) Resp:  [16-18] 18 (04/09 0815) BP: (151-173)/(60-76) 173/67 (04/09 0815) SpO2:  [93 %-96 %] 93 % (04/09 0815)  Weight change:  Filed Weights   11/18/22 2314  Weight: 86.2 kg    Intake/Output: I/O last 3 completed shifts: In: 1120 [P.O.:720; I.V.:60; Blood:340] Out: 2250 [Urine:2250]   Intake/Output this shift:  No intake/output data recorded.  Physical Exam: General: NAD, resting comfortably in bed  Head: Normocephalic, atraumatic. Moist oral mucosal membranes  Eyes: Anicteric   Lungs:  Clear to auscultation, normal effort  Heart: Regular rate and rhythm  Abdomen:  Soft, nontender,   Extremities:  no peripheral edema.  Neurologic: Alert and oriented, moving all four extremities  Skin: No lesions       Basic Metabolic Panel: Recent Labs  Lab 11/20/22 0838 11/21/22 0531 11/22/22 0510 11/23/22 0548 11/24/22 0523 11/25/22 0628 11/26/22 0451  NA 135 135 136 135 135 136 137  K 4.3 4.2 4.3 4.0 3.9 4.3 4.0  CL 107 107 109 103 104 105 104  CO2 23 21* 21* 24 25 25 27   GLUCOSE 189* 186* 289* 201* 152* 181* 195*  BUN 28* 33* 37* 35* 31* 32* 30*  CREATININE 2.30* 2.37* 2.51* 2.29* 2.28* 2.11* 1.89*  CALCIUM 8.2* 8.0* 8.1* 8.0* 7.9* 7.9* 7.9*  MG 2.1 2.1  --  2.1 2.2  --   --      Liver Function Tests: Recent Labs  Lab 11/21/22 0932  AST 41  ALT 24  ALKPHOS 72  BILITOT 0.8  PROT 7.2  ALBUMIN 2.9*    No results for input(s): "LIPASE", "AMYLASE" in the last 168 hours. No results for input(s): "AMMONIA" in  the last 168 hours.  CBC: Recent Labs  Lab 11/22/22 0510 11/23/22 0548 11/24/22 0523 11/25/22 0628 11/26/22 0451  WBC 4.4 4.7 3.4* 2.7* 3.6*  HGB 7.5* 7.5* 7.3* 6.9* 8.3*  HCT 24.0* 23.6* 23.1* 21.7* 26.4*  MCV 99.2 100.4* 100.0 100.0 98.1  PLT 104* 100* 103* 101* 116*     Cardiac Enzymes: No results for input(s): "CKTOTAL", "CKMB", "CKMBINDEX", "TROPONINI" in the last 168 hours.  BNP: Invalid input(s): "POCBNP"  CBG: Recent Labs  Lab 11/25/22 0829 11/25/22 1726 11/25/22 2113 11/26/22 0813 11/26/22 1157  GLUCAP 162* 279* 152* 198* 295*     Microbiology: Results for orders placed or performed during the hospital encounter of 11/19/22  Urine Culture (for pregnant, neutropenic or urologic patients or patients with an indwelling urinary catheter)     Status: None   Collection Time: 11/19/22  1:11 AM   Specimen: Urine, Random  Result Value Ref Range Status   Specimen Description   Final    URINE, RANDOM Performed at [facility], [address], [place], [place] [address]    Special Requests   Final    NONE Performed at [facility], [address] [place], [place] [address]    Culture   Final    NO GROWTH Performed at [facility], [address],  [place], [place] [address]    Report Status 11/20/2022 FINAL  Final   *Note: Due to a large number of results and/or encounters for the requested time period, some results have not been displayed. A complete set of results can be found in Results Review.    Coagulation Studies: No results for input(s): "LABPROT", "INR" in the last 72 hours.  Urinalysis: No results for input(s): "COLORURINE", "LABSPEC", "PHURINE", "GLUCOSEU", "HGBUR", "BILIRUBINUR", "KETONESUR", "PROTEINUR", "UROBILINOGEN", "NITRITE", "LEUKOCYTESUR" in the last 72 hours.  Invalid input(s): "APPERANCEUR"    Imaging: No results found.   Medications:      amLODipine  10 mg Oral Daily   aspirin EC  81 mg Oral  Daily   enoxaparin (LOVENOX) injection  30 mg Subcutaneous Q24H   insulin aspart  0-15 Units Subcutaneous TID WC   insulin aspart  0-5 Units Subcutaneous QHS   melatonin  2.5 mg Oral QHS   pantoprazole  40 mg Oral BID   pravastatin  20 mg Oral QPM   QUEtiapine  25 mg Oral QHS   sertraline  25 mg Oral Daily   tamsulosin  0.4 mg Oral Daily   acetaminophen **OR** acetaminophen, ondansetron **OR** ondansetron (ZOFRAN) IV  Assessment/ Plan:  [name] is a 88 y.o.  male with Alzheimer's dementia, BPH, Hairy cell leukemia, hypertension, hyperlipidemia, diabetes mellitus type II, diabetic retinopathy, , who was admitted to [facility] on 11/19/2022 for Generalized weakness [R53.1] AKI (acute kidney injury) [N17.9] Acute renal failure superimposed on stage 3a chronic kidney disease [N17.9, N18.31]   Acute kidney injury: on chronic kidney disease stage IIIA with baseline creatinine of 1.45, GFR of 47 on 08/27/2022. Patient with history of hematuria, proteinuria and glycosuria. Consistent with diabetic nephropathy. Acute kidney injury most likely secondary to prerenal azotemia. Not on ACE-I/ARB, no diuretic, no SGLT-2 inhibitor as outpatient. No obstruction on ultrasound. No IV contrast exposure. No exposure to any other nephrotoxic agents. -Adequate urine output noted - Creatinine slowly improving   Diabetes mellitus type II with chronic kidney disease and renal manifestations. Insulin dependent. Not on SGLT-2 or ACE-I/ARB  Glucose elevated at times.  Sliding scale managed by primary team.   Hypertension with chronic kidney disease: also takes doxazosin at home.  - on amlodipine and tamsulosin.  - Blood pressure elevated today, 173/67.   Anemia with chronic kidney disease, with history of smoldering myeloma and remission of Hairy cell leukemia. Followed by Dr. Smith Robert, [facility].  Hemoglobin 6.9 yesterday, patient received 1 unit blood transfusion.  Corrected to 8.3 today.   LOS:  6   4/9/20241:56 PM

## 2022-11-26 NOTE — Progress Notes (Signed)
Hematology/Oncology Consult note [facility]  [phone] [phone]  Patient Care Team: [facility], [facility] Making as PCP - General (Geriatric Medicine) Rozetta Nunnery, MD as Referring Physician (Hematology and Oncology) Rozetta Nunnery, MD as Referring Physician (Hematology and Oncology)   Name of the patient: Alex Smith  [id number]  17-Mar-1935   Date of visit: 11/26/22  Interval history-he still feels quite fatigued and weak.  No new complaints at this time.  ECOG PS- 3 Pain scale- 0   Review of systems- Review of Systems  Constitutional:  Positive for malaise/fatigue. Negative for chills, fever and weight loss.  HENT:  Negative for congestion, ear discharge and nosebleeds.   Eyes:  Negative for blurred vision.  Respiratory:  Negative for cough, hemoptysis, sputum production, shortness of breath and wheezing.   Cardiovascular:  Negative for chest pain, palpitations, orthopnea and claudication.  Gastrointestinal:  Negative for abdominal pain, blood in stool, constipation, diarrhea, heartburn, melena, nausea and vomiting.  Genitourinary:  Negative for dysuria, flank pain, frequency, hematuria and urgency.  Musculoskeletal:  Negative for back pain, joint pain and myalgias.  Skin:  Negative for rash.  Neurological:  Negative for dizziness, tingling, focal weakness, seizures, weakness and headaches.  Endo/Heme/Allergies:  Does not bruise/bleed easily.  Psychiatric/Behavioral:  Negative for depression and suicidal ideas. The patient does not have insomnia.       Allergies  Allergen Reactions   Rituximab Rash    Chest tightness Chest tightness   Blood-Group Specific Substance Other (See Comments)    Had a post transfusion reaction of red blood cells; NOW REQUIRES WASHED BLOOD CELLS Had a post transfusion reaction of red blood cells; NOW REQUIRES WASHED BLOOD CELLS   Primaxin [Imipenem] Other (See Comments)    Possible  allergy Tolerates cephalosporins   Voriconazole Other (See Comments)   Sulfa Antibiotics Itching and Rash   Sulfacetamide Sodium Itching and Rash     Past Medical History:  Diagnosis Date   Alzheimer's dementia (HCC) 08/18/2021   Anemia    B12 deficiency    Basal cell carcinoma 03/30/2018   left ant nasal ala   Basal cell carcinoma of face 01/19/2013   right distal dorsum nose   BPH (benign prostatic hypertrophy)    followed by urology, discharged (Dr. 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 pretibial. 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 EDC 09/07/2020   Squamous cell carcinoma of skin 04/03/2020   Left cheek. WD SCC   Systolic murmur 05/28/2016   Type 2 diabetes, controlled, with retinopathy (HCC)      Past Surgical History:  Procedure Laterality Date   25 GAUGE PARS PLANA VITRECTOMY WITH 20 GAUGE MVR PORT FOR MACULAR HOLE  07/07/2012   Procedure: 25 GAUGE PARS PLANA VITRECTOMY WITH 20 GAUGE MVR PORT FOR MACULAR HOLE;  Surgeon:  Sherrie George, MD;  Location: [facility] OR;  Service: Ophthalmology;  Laterality: Left;   BONE MARROW BIOPSY  2016   CARDIOVASCULAR STRESS TEST  2013   treadmill - no evidence ischemia, EF 61%   CATARACT EXTRACTION W/ INTRAOCULAR LENS  IMPLANT, BILATERAL  ~ 2010   COLONOSCOPY  2014   Elliot WNL no rpt needed, h/o polyps   EYE SURGERY Left 06/2012   laser surgery   GAS INSERTION  07/07/2012    Procedure: INSERTION OF GAS;  Surgeon: Sherrie George, MD;  Location: [facility] OR;  Service: Ophthalmology;  Laterality: Left;  C3F8   PERIPHERAL VASCULAR CATHETERIZATION N/A 02/23/2015   Procedure: Shelda Pal Cath Insertion;  Surgeon: Annice Needy, MD;  Location: [facility] INVASIVE CV LAB;  Service: Cardiovascular;  Laterality: N/A;   PORTA CATH REMOVAL N/A 02/16/2020   Procedure: PORTA CATH REMOVAL;  Surgeon: Annice Needy, MD;  Location: [facility] INVASIVE CV LAB;  Service: Cardiovascular;  Laterality: N/A;   SERUM PATCH  07/07/2012   Procedure: SERUM PATCH;  Surgeon: Sherrie George, MD;  Location: [facility] OR;  Service: Ophthalmology;  Laterality: Left;   SKIN CANCER EXCISION     "all over my face" (07/07/2012)    Social History   Socioeconomic History   Marital status: Married    Spouse name: Not on file   Number of children: Not on file   Years of education: Not on file   Highest education level: Not on file  Occupational History   Not on file  Tobacco Use   Smoking status: Former    Packs/day: 2.00    Years: 25.00    Additional pack years: 0.00    Total pack years: 50.00    Types: Cigarettes    Quit date: 02/06/1969    Years since quitting: 53.8   Smokeless tobacco: Never   Tobacco comments:    07/07/2012 "stopped smoking ~ 40 yr ago; smoked 20-4yr"  Vaping Use   Vaping Use: Never used  Substance and Sexual Activity   Alcohol use: Not Currently   Drug use: No   Sexual activity: Never  Other Topics Concern   Not on file  Social History Narrative   Lives at home alone. 1 cat   Occupation: was Tree surgeon, works part time for [employer]   Activity: likes to golf   Diet: moderate water, fruits/vegetables daily   Social Determinants of Corporate investment banker Strain: Not on file  Food Insecurity: Not on file  Transportation Needs: Not on file  Physical Activity: Not on file  Stress: Not on file  Social Connections: Not on file  Intimate Partner Violence: Not on file     Family History  Problem Relation Age of Onset   Dementia Mother    Heart failure Father 37   Cancer Sister        breast   Diabetes Paternal Uncle    Diabetes Paternal Aunt    CAD Brother 39       MI   Stroke Neg Hx      Current Facility-Administered Medications:    acetaminophen (TYLENOL) tablet 650 mg, 650 mg, Oral, Q6H PRN, 650 mg at 11/27/22 1204 **OR** acetaminophen (TYLENOL) suppository 650 mg, 650 mg, Rectal, Q6H PRN, Andris Baumann, MD   amLODipine (NORVASC) tablet 10 mg, 10 mg, Oral, Daily, Lindajo Royal V, MD, 10 mg at 11/27/22 1038   aspirin EC tablet 81 mg, 81 mg, Oral, Daily, Andris Baumann, MD, 81  mg at 11/27/22 1039   enoxaparin (LOVENOX) injection 30 mg, 30 mg, Subcutaneous, Q24H, Lindajo Royal V, MD, 30 mg at 11/27/22 1038   fentaNYL (SUBLIMAZE) 100 MCG/2ML injection, , , ,    hydrALAZINE (APRESOLINE) injection 10 mg, 10 mg, Intravenous, Q6H PRN, Mansy, Jan A, MD   insulin aspart (novoLOG) injection 0-15 Units, 0-15 Units, Subcutaneous, TID WC, Andris Baumann, MD, 8 Units at 11/27/22 1205   insulin aspart (novoLOG) injection 0-5 Units, 0-5 Units, Subcutaneous, QHS, Andris Baumann, MD, 3 Units at 11/22/22 2210   labetalol (NORMODYNE) injection 20 mg, 20 mg, Intravenous, Q3H PRN, Mansy, Jan A, MD   melatonin tablet 2.5 mg, 2.5 mg, Oral, QHS, Lindajo Royal V, MD, 2.5 mg at 11/26/22 2134   midazolam (VERSED) 2 MG/2ML injection, , , ,    ondansetron (ZOFRAN) tablet 4 mg, 4 mg, Oral, Q6H PRN **OR** ondansetron (ZOFRAN) injection 4 mg, 4 mg, Intravenous, Q6H PRN, Andris Baumann, MD   pantoprazole (PROTONIX) EC tablet 40 mg, 40 mg, Oral, BID, Lindajo Royal V, MD, 40 mg at 11/27/22 1039   QUEtiapine (SEROQUEL) tablet 25 mg, 25 mg, Oral, QHS, Marrion Coy, MD, 25 mg at 11/26/22 2134   sertraline (ZOLOFT) tablet 25 mg, 25 mg, Oral, Daily, Lindajo Royal V, MD, 25 mg at 11/27/22 1038   tamsulosin (FLOMAX) capsule 0.4 mg, 0.4 mg, Oral, Daily, Lindajo Royal V, MD, 0.4 mg at  11/27/22 1039  Facility-Administered Medications Ordered in Other Encounters:    heparin lock flush 100 unit/mL, 500 Units, Intravenous, Once, Corcoran, Melissa C, MD   luspatercept-aamt (REBLOZYL) subcutaneous injection 100 mg, 100 mg, Subcutaneous, Q21 days, Creig Hines, MD, 100 mg at 08/27/22 1206   sodium chloride 0.9 % injection 10 mL, 10 mL, Intravenous, PRN, [name], Melissa C, MD, 10 mL at 03/03/15 0903   sodium chloride 0.9 % injection 10 mL, 10 mL, Intracatheter, PRN, [name], Melissa C, MD, 10 mL at 03/10/15 1410   sodium chloride flush (NS) 0.9 % injection 10 mL, 10 mL, Intravenous, PRN, Nelva Nay C, MD, 10 mL at 07/23/18 1517  Physical exam:  Vitals:   11/27/22 0930 11/27/22 0945 11/27/22 1000 11/27/22 1028  BP: (!) 164/72 (!) 166/88 (!) 177/73 (!) 164/68  Pulse: 76 79 82 80  Resp: 17 18 20 18   Temp:    (!) 97.4 F (36.3 C)  TempSrc:      SpO2: 93% 92% 93% 96%  Weight:      Height:       Physical Exam Cardiovascular:     Rate and Rhythm: Normal rate and regular rhythm.     Heart sounds: Normal heart sounds.  Pulmonary:     Effort: Pulmonary effort is normal.     Breath sounds: Normal breath sounds.  Abdominal:     General: Bowel sounds are normal.     Palpations: Abdomen is soft.  Skin:    General: Skin is warm and dry.  Neurological:     Mental Status: He is alert and oriented to person, place, and time.         Latest Ref Rng & Units 11/26/2022    4:51 AM  CMP  Glucose 70 - 99 mg/dL 924   BUN 8 - 23 mg/dL 30   Creatinine 2.68 - 1.24 mg/dL 3.41   Sodium 962 - 229 mmol/L 137   Potassium 3.5 - 5.1 mmol/L 4.0   Chloride 98 - 111 mmol/L 104   CO2 22 - 32  mmol/L 27   Calcium 8.9 - 10.3 mg/dL 7.9       Latest Ref Rng & Units 11/27/2022    5:56 AM  CBC  WBC 4.0 - 10.5 K/uL 3.0   Hemoglobin 13.0 - 17.0 g/dL 8.3   Hematocrit 04.539.0 - 52.0 % 26.2   Platelets 150 - 400 K/uL 111     @IMAGES @  US RENAL  Result Date: 11/19/2022 CLINICAL DATA:   Acute on chronic renal insufficiency EXAM: RENAL / URINARY TRACT ULTRASOUND COMPLETE COMPARISON:  04/09/2022 FINDINGS: Right Kidney: Renal measurements: 10.2 x 6.1 x 4.8 cm = volume: 155.6 mL. Echogenicity within normal limits. Benign simple cortical cyst measuring 1.2 cm upper pole. No hydronephrosis or nephrolithiasis. Left Kidney: Renal measurements: 12.7 x 6.5 x 5.8 cm = volume: 240.9 mL. Echogenicity within normal limits. 1.1 cm simple cyst upper pole left kidney. The larger simple cysts seen posteriorly off the left kidney on prior CT is not well visualized. No hydronephrosis or nephrolithiasis. Bladder: Appears normal for degree of bladder distention. Other: Incidental cholelithiasis.  No evidence of cholecystitis. IMPRESSION: 1. Unremarkable appearance of the bilateral kidneys. 2. Incidental cholelithiasis.  No evidence of cholecystitis. Electronically Signed   By: Sharlet SalinaMichael  Brown M.D.   On: 11/19/2022 18:22   DG Chest 2 View  Result Date: 11/18/2022 CLINICAL DATA:  Generalized weakness EXAM: CHEST - 2 VIEW COMPARISON:  04/09/2022 FINDINGS: Cardiac and mediastinal contours are unchanged. No focal pulmonary opacity. No pleural effusion or pneumothorax. No acute osseous abnormality. IMPRESSION: No acute cardiopulmonary process. Electronically Signed   By: Wiliam KeAlison  Vasan M.D.   On: 11/18/2022 23:48     Assessment and plan- Patient is a 88 y.o. male admitted for generalized weakness AKI on CKD and progressive anemia  Normocytic anemia: Iron studies are indicative of anemia of chronic disease.  B12 levels are normal.  Myeloma panelShows stable M protein of 1.3 g which has not changed significantly in the last 2 years.  Serum free light chain ratio was 3.9 and kappa free light chain has not changed significantly in the last 8 months.  Based on peripheral blood work it is not conclusive of progression of his smoldering multiple myeloma to overt multiple myeloma.  He will be getting bone marrow biopsy done  tomorrow to see if there is any further progression of MDS that can explain his anemia.  Patient will be going to short-term rehab and his Luspatercept will be on hold.  I will see him a week after his bone marrow biopsy to discuss results and further management.  I explained all this to his son as well in detail.   Visit Diagnosis 1. AKI (acute kidney injury)   2. Generalized weakness      Dr. Owens SharkArchana Kynnedy Carreno, MD, MPH [facility] [phone] 11/27/2022 12:34 PM

## 2022-11-26 NOTE — Evaluation (Signed)
Occupational Therapy Evaluation Patient Details Name: Alex Smith MRN: [id number] DOB: [birth year] Today's Date: 11/26/2022   History of Present Illness Alex Smith is a 88 y.o. male with medical history significant for HTN, smoldering myeloma, hairy cell leukemia, MDS on Luspatercept for anemia, followed by Dr. Smith Robert of oncology, as well as insulin-dependent type 2 diabetes, BPH, CKD 3A, Alzheimer's dementia, HTN, who was sent to the ED from ALF for evaluation of weakness and dysuria and concern for UTI.   Clinical Impression   Alex Smith was seen for OT evaluation this date. Prior to hospital admission, pt was MOD I using RW at ALF. Pt presents to acute OT demonstrating impaired ADL performance and functional mobility 2/2 decreased activity tolerance and functional strength/ROM/balance deficits. Pt currently requires initial MOD A + RW sit<>stand from bed height, improves to MIN A + RW sit<>stand x3 from chair height, cues for hand placement. MIN A + RW ~5 feet mobility, pt self-limits citing fatigue. Pt would benefit from skilled OT to address noted impairments and functional limitations (see below for any additional details). Upon hospital discharge, recommend follow up therapy.   Recommendations for follow up therapy are one component of a multi-disciplinary discharge planning process, led by the attending physician.  Recommendations may be updated based on patient status, additional functional criteria and insurance authorization.   Assistance Recommended at Discharge Intermittent Supervision/Assistance  Patient can return home with the following A lot of help with walking and/or transfers;A lot of help with bathing/dressing/bathroom;Help with stairs or ramp for entrance    Functional Status Assessment  Patient has had a recent decline in their functional status and demonstrates the ability to make significant improvements in function in a reasonable and predictable amount of time.   Equipment Recommendations  BSC/3in1    Recommendations for Other Services       Precautions / Restrictions Precautions Precautions: Fall Restrictions Weight Bearing Restrictions: No      Mobility Bed Mobility Overal bed mobility: Needs Assistance Bed Mobility: Supine to Sit     Supine to sit: Min assist          Transfers Overall transfer level: Needs assistance Equipment used: Rolling walker (2 wheels) Transfers: Sit to/from Stand, Bed to chair/wheelchair/BSC Sit to Stand: Min assist, Mod assist     Step pivot transfers: Min assist     General transfer comment: MOD A to rise from bed, MIN A to rise from chair      Balance Overall balance assessment: Needs assistance Sitting-balance support: Bilateral upper extremity supported, Feet supported Sitting balance-Leahy Scale: Fair     Standing balance support: Bilateral upper extremity supported, During functional activity, Reliant on assistive device for balance Standing balance-Leahy Scale: Fair                             ADL either performed or assessed with clinical judgement   ADL Overall ADL's : Needs assistance/impaired                                       General ADL Comments: MIN A + RW simulated BSC t/f. SETUP + SUPERVISION seated grooming tasks. MAX A for LB access seated EOB      Pertinent Vitals/Pain Pain Assessment Pain Assessment: No/denies pain     Hand Dominance Right   Extremity/Trunk Assessment Upper Extremity Assessment Upper  Extremity Assessment: Generalized weakness   Lower Extremity Assessment Lower Extremity Assessment: Generalized weakness       Communication Communication Communication: No difficulties   Cognition Arousal/Alertness: Awake/alert Behavior During Therapy: WFL for tasks assessed/performed Overall Cognitive Status: History of cognitive impairments - at baseline                                 General  Comments: oriented to location/self, disoriented to time                Home Living Family/patient expects to be discharged to:: Assisted living                             Home Equipment: Rollator (4 wheels)   Additional Comments: [facility]      Prior Functioning/Environment Prior Level of Function : Needs assist               ADLs Comments: assist for IADLs        OT Problem List: Decreased strength;Decreased range of motion;Decreased activity tolerance;Impaired balance (sitting and/or standing);Decreased safety awareness      OT Treatment/Interventions: Self-care/ADL training;Therapeutic exercise;Energy conservation;DME and/or AE instruction;Therapeutic activities;Balance training;Patient/family education    OT Goals(Current goals can be found in the care plan section) Acute Rehab OT Goals Patient Stated Goal: to get stronger OT Goal Formulation: With patient Time For Goal Achievement: 12/10/22 Potential to Achieve Goals: Good ADL Goals Pt Will Perform Grooming: with modified independence;standing Pt Will Perform Lower Body Dressing: with modified independence;with adaptive equipment;sit to/from stand Pt Will Transfer to Toilet: with modified independence;ambulating;regular height toilet  OT Frequency: Min 2X/week    Co-evaluation              AM-PAC OT "6 Clicks" Daily Activity     Outcome Measure Help from another person eating meals?: None Help from another person taking care of personal grooming?: A Little Help from another person toileting, which includes using toliet, bedpan, or urinal?: A Lot Help from another person bathing (including washing, rinsing, drying)?: A Lot Help from another person to put on and taking off regular upper body clothing?: A Little Help from another person to put on and taking off regular lower body clothing?: A Lot 6 Click Score: 16   End of Session Equipment Utilized During  Treatment: Rolling walker (2 wheels) Nurse Communication: Mobility status  Activity Tolerance: Patient tolerated treatment well Patient left: in chair;with call bell/phone within reach;with chair alarm set;with family/visitor present  OT Visit Diagnosis: Other abnormalities of gait and mobility (R26.89);Muscle weakness (generalized) (M62.81)                Time: 5621-3086 OT Time Calculation (min): 13 min Charges:  OT General Charges $OT Visit: 1 Visit OT Evaluation $OT Eval Moderate Complexity: 1 Mod  Kathie Dike, M.S. OTR/L  11/26/22, 11:01 AM  ascom [phone]

## 2022-11-27 ENCOUNTER — Inpatient Hospital Stay: Payer: PPO

## 2022-11-27 DIAGNOSIS — C9141 Hairy cell leukemia, in remission: Secondary | ICD-10-CM | POA: Diagnosis not present

## 2022-11-27 DIAGNOSIS — R3911 Hesitancy of micturition: Secondary | ICD-10-CM

## 2022-11-27 DIAGNOSIS — N401 Enlarged prostate with lower urinary tract symptoms: Secondary | ICD-10-CM

## 2022-11-27 DIAGNOSIS — E1122 Type 2 diabetes mellitus with diabetic chronic kidney disease: Secondary | ICD-10-CM

## 2022-11-27 DIAGNOSIS — N170 Acute kidney failure with tubular necrosis: Secondary | ICD-10-CM | POA: Diagnosis not present

## 2022-11-27 DIAGNOSIS — R531 Weakness: Secondary | ICD-10-CM | POA: Diagnosis not present

## 2022-11-27 DIAGNOSIS — I10 Essential (primary) hypertension: Secondary | ICD-10-CM

## 2022-11-27 DIAGNOSIS — D649 Anemia, unspecified: Secondary | ICD-10-CM

## 2022-11-27 DIAGNOSIS — Z794 Long term (current) use of insulin: Secondary | ICD-10-CM

## 2022-11-27 LAB — CBC WITH DIFFERENTIAL/PLATELET
Abs Immature Granulocytes: 0.01 10*3/uL (ref 0.00–0.07)
Basophils Absolute: 0 10*3/uL (ref 0.0–0.1)
Basophils Relative: 0 %
Eosinophils Absolute: 0.2 10*3/uL (ref 0.0–0.5)
Eosinophils Relative: 5 %
HCT: 26.2 % — ABNORMAL LOW (ref 39.0–52.0)
Hemoglobin: 8.3 g/dL — ABNORMAL LOW (ref 13.0–17.0)
Immature Granulocytes: 0 %
Lymphocytes Relative: 23 %
Lymphs Abs: 0.7 10*3/uL (ref 0.7–4.0)
MCH: 30.7 pg (ref 26.0–34.0)
MCHC: 31.7 g/dL (ref 30.0–36.0)
MCV: 97 fL (ref 80.0–100.0)
Monocytes Absolute: 0.1 10*3/uL (ref 0.1–1.0)
Monocytes Relative: 4 %
Neutro Abs: 2 10*3/uL (ref 1.7–7.7)
Neutrophils Relative %: 68 %
Platelets: 111 10*3/uL — ABNORMAL LOW (ref 150–400)
RBC: 2.7 MIL/uL — ABNORMAL LOW (ref 4.22–5.81)
RDW: 17.5 % — ABNORMAL HIGH (ref 11.5–15.5)
WBC: 3 10*3/uL — ABNORMAL LOW (ref 4.0–10.5)
nRBC: 0 % (ref 0.0–0.2)

## 2022-11-27 LAB — GLUCOSE, CAPILLARY
Glucose-Capillary: 191 mg/dL — ABNORMAL HIGH (ref 70–99)
Glucose-Capillary: 295 mg/dL — ABNORMAL HIGH (ref 70–99)

## 2022-11-27 MED ORDER — FENTANYL CITRATE (PF) 100 MCG/2ML IJ SOLN
INTRAMUSCULAR | Status: AC
  Filled 2022-11-27: qty 2

## 2022-11-27 MED ORDER — HEPARIN SOD (PORK) LOCK FLUSH 100 UNIT/ML IV SOLN
INTRAVENOUS | Status: AC
  Filled 2022-11-27: qty 5

## 2022-11-27 MED ORDER — MIDAZOLAM HCL 2 MG/2ML IJ SOLN
INTRAMUSCULAR | Status: AC
  Filled 2022-11-27: qty 4

## 2022-11-27 MED ORDER — ACETAMINOPHEN 500 MG PO TABS: 1000.0000 mg | ORAL_TABLET | Freq: Two times a day (BID) | ORAL | 0 refills | Status: DC

## 2022-11-27 MED ORDER — LIDOCAINE HCL (PF) 1 % IJ SOLN
6.0000 mL | Freq: Once | INTRAMUSCULAR | Status: AC
  Administered 2022-11-27: 6 mL

## 2022-11-27 MED ORDER — QUETIAPINE FUMARATE 25 MG PO TABS: 12.5000 mg | ORAL_TABLET | Freq: Every evening | ORAL | 0 refills | Status: DC | PRN

## 2022-11-27 MED ORDER — INSULIN GLARGINE 100 UNIT/ML SOLOSTAR PEN: 6.0000 [IU] | PEN_INJECTOR | Freq: Every day | SUBCUTANEOUS | 11 refills | Status: DC

## 2022-11-27 MED ORDER — MIDAZOLAM HCL 2 MG/2ML IJ SOLN
INTRAMUSCULAR | Status: AC | PRN
  Administered 2022-11-27: 1 mg via INTRAVENOUS

## 2022-11-27 MED ORDER — FENTANYL CITRATE (PF) 100 MCG/2ML IJ SOLN
INTRAMUSCULAR | Status: AC | PRN
  Administered 2022-11-27: 50 ug via INTRAVENOUS

## 2022-11-27 MED ORDER — INSULIN LISPRO (1 UNIT DIAL) 100 UNIT/ML (KWIKPEN): 2.0000 [IU] | PEN_INJECTOR | Freq: Three times a day (TID) | SUBCUTANEOUS | 11 refills | Status: DC

## 2022-11-27 MED ORDER — LOSARTAN POTASSIUM 25 MG PO TABS: 25.0000 mg | ORAL_TABLET | Freq: Every day | ORAL | 0 refills | Status: DC

## 2022-11-27 NOTE — Progress Notes (Signed)
Report given to specials RN.

## 2022-11-27 NOTE — Inpatient Diabetes Management (Signed)
Inpatient Diabetes Program Recommendations  AACE/ADA: New Consensus Statement on Inpatient Glycemic Control   Target Ranges:  Prepandial:   less than 140 mg/dL      [facility] postprandial:   less than 180 mg/dL (1-2 hours)      Critically ill patients:  140 - 180 mg/dL    Latest Reference Range & Units 11/26/22 08:13 11/26/22 11:57 11/26/22 16:17 11/26/22 21:22 11/27/22 07:12  Glucose-Capillary 70 - 99 mg/dL 412 (H) 878 (H) 676 (H) 124 (H) 191 (H)   Review of Glycemic Control  Diabetes history: DM2 Outpatient Diabetes medications: Lantus 22 units QHS, Humalog 0-9 units TID with meals, Januvia 50 mg daily (not taking) Current orders for Inpatient glycemic control: Novolog 0-15 units TID with meals, Novolog 0-5 units QHS  Inpatient Diabetes Program Recommendations:    Insulin: Please consider ordering Novolog 3 units TID with meals for meal coverage if patient eats at least 50% of meals.  Thanks, Orlando Penner, RN, MSN, CDCES Diabetes Coordinator Inpatient Diabetes Program [phone] (Team Pager from 8am to 5pm)

## 2022-11-27 NOTE — TOC Transition Note (Signed)
Transition of Care [facility]) - CM/SW Discharge Note   Patient Details  Name: Alex Smith MRN: [id number] Date of Birth: July 06, 1935  Transition of Care [facility]) CM/SW Contact:  Chapman Fitch, RN Phone Number: 11/27/2022, 12:08 PM   Clinical Narrative:    Patient will DC to: [facility]  Anticipated DC date: 11/27/22  Family notified: [name] at bedside  Transport by: Wendie Simmer  Per MD patient ready for DC to . RN, patient, patient's family, and facility notified of DC. Discharge Summary sent to facility. RN given number for report. DC packet on chart. Ambulance transport requested for patient.  TOC signing off.  Bevelyn Ngo [facility] [phone]          Patient Goals and CMS Choice      Discharge Placement                         Discharge Plan and Services Additional resources added to the After Visit Summary for                                       Social Determinants of Health (SDOH) Interventions SDOH Screenings   Depression (PHQ2-9): Medium Risk (03/09/2019)  Tobacco Use: Medium Risk (11/14/2022)     Readmission Risk Interventions    04/13/2022    2:31 PM 08/19/2021   10:21 AM 05/11/2021   10:42 AM  Readmission Risk Prevention Plan  Transportation Screening Complete Complete Complete  PCP or Specialist Appt within 3-5 Days Complete    HRI or Home Care Consult Complete    Social Work Consult for Recovery Care Planning/Counseling Complete    Palliative Care Screening Complete    Medication Review Oceanographer) Complete Complete Complete  PCP or Specialist appointment within 3-5 days of discharge  Complete Complete  HRI or Home Care Consult  Complete Complete  SW Recovery Care/Counseling Consult  Complete Complete  Palliative Care Screening  Not Applicable Complete  Skilled Nursing Facility  Complete Complete

## 2022-11-27 NOTE — Progress Notes (Addendum)
[facility]  ROUNDING NOTE   Subjective:   Patient sitting up in bed Alert and oriented Daughter at bedside States he feels well Room air No lower extremity edema  CT guided bone biopsy this morning, reporting soreness.    Objective:  Vital signs in last 24 hours:  Temp:  [97.4 F (36.3 C)-98.9 F (37.2 C)] 97.4 F (36.3 C) (04/10 1028) Pulse Rate:  [62-82] 80 (04/10 1028) Resp:  [15-20] 18 (04/10 1028) BP: (139-182)/(64-109) 164/68 (04/10 1028) SpO2:  [92 %-100 %] 96 % (04/10 1028)  Weight change:  Filed Weights   11/18/22 2314  Weight: 86.2 kg    Intake/Output: I/O last 3 completed shifts: In: 240 [P.O.:240] Out: 2300 [Urine:2300]   Intake/Output this shift:  No intake/output data recorded.  Physical Exam: General: NAD, resting comfortably in bed  Head: Normocephalic, atraumatic. Moist oral mucosal membranes  Eyes: Anicteric   Lungs:  Clear to auscultation, normal effort  Heart: Regular rate and rhythm  Abdomen:  Soft, nontender  Extremities:  no peripheral edema.  Neurologic: Alert and oriented, moving all four extremities  Skin: No lesions       Basic Metabolic Panel: Recent Labs  Lab 11/21/22 0531 11/22/22 0510 11/23/22 0548 11/24/22 0523 11/25/22 0628 11/26/22 0451  NA 135 136 135 135 136 137  K 4.2 4.3 4.0 3.9 4.3 4.0  CL 107 109 103 104 105 104  CO2 21* 21* 24 25 25 27   GLUCOSE 186* 289* 201* 152* 181* 195*  BUN 33* 37* 35* 31* 32* 30*  CREATININE 2.37* 2.51* 2.29* 2.28* 2.11* 1.89*  CALCIUM 8.0* 8.1* 8.0* 7.9* 7.9* 7.9*  MG 2.1  --  2.1 2.2  --   --      Liver Function Tests: Recent Labs  Lab 11/21/22 0932  AST 41  ALT 24  ALKPHOS 72  BILITOT 0.8  PROT 7.2  ALBUMIN 2.9*    No results for input(s): "LIPASE", "AMYLASE" in the last 168 hours. No results for input(s): "AMMONIA" in the last 168 hours.  CBC: Recent Labs  Lab 11/23/22 0548 11/24/22 0523 11/25/22 0628 11/26/22 0451 11/27/22 0556  WBC 4.7  3.4* 2.7* 3.6* 3.0*  NEUTROABS  --   --   --   --  2.0  HGB 7.5* 7.3* 6.9* 8.3* 8.3*  HCT 23.6* 23.1* 21.7* 26.4* 26.2*  MCV 100.4* 100.0 100.0 98.1 97.0  PLT 100* 103* 101* 116* 111*     Cardiac Enzymes: No results for input(s): "CKTOTAL", "CKMB", "CKMBINDEX", "TROPONINI" in the last 168 hours.  BNP: Invalid input(s): "POCBNP"  CBG: Recent Labs  Lab 11/26/22 1157 11/26/22 1617 11/26/22 2122 11/27/22 0712 11/27/22 1150  GLUCAP 295* 244* 124* 191* 295*     Microbiology: Results for orders placed or performed during the hospital encounter of 11/19/22  Urine Culture (for pregnant, neutropenic or urologic patients or patients with an indwelling urinary catheter)     Status: None   Collection Time: 11/19/22  1:11 AM   Specimen: Urine, Random  Result Value Ref Range Status   Specimen Description   Final    URINE, RANDOM Performed at [facility], [address] [place], [place] [address]    Special Requests   Final    NONE Performed at [facility], [address] [place], [place] [address]    Culture   Final    NO GROWTH Performed at [facility], [address] [place], [place] [address]    Report  Status 11/20/2022 FINAL  Final   *Note: Due to a large number of results and/or encounters for the requested time period, some results have not been displayed. A complete set of results can be found in Results Review.    Coagulation Studies: No results for input(s): "LABPROT", "INR" in the last 72 hours.  Urinalysis: No results for input(s): "COLORURINE", "LABSPEC", "PHURINE", "GLUCOSEU", "HGBUR", "BILIRUBINUR", "KETONESUR", "PROTEINUR", "UROBILINOGEN", "NITRITE", "LEUKOCYTESUR" in the last 72 hours.  Invalid input(s): "APPERANCEUR"    Imaging: CT BONE MARROW BIOPSY & ASPIRATION  Result Date: 11/27/2022 INDICATION: Anemia.  History of smoldering myeloma. EXAM: CT GUIDED BONE MARROW ASPIRATION AND CORE BIOPSY Interventional Radiologist:   Sterling Big, MD MEDICATIONS: None. ANESTHESIA/SEDATION: Moderate (conscious) sedation was employed during this procedure. A total of 1 milligrams versed and 50 micrograms fentanyl were administered intravenously. The patient's level of consciousness and vital signs were monitored continuously by radiology nursing throughout the procedure under my direct supervision. Total monitored sedation time: 13 minutes FLUOROSCOPY: None. COMPLICATIONS: None immediate. Estimated blood loss: <25 mL PROCEDURE: Informed written consent was obtained from the patient after a thorough discussion of the procedural risks, benefits and alternatives. All questions were addressed. Maximal Sterile Barrier Technique was utilized including caps, mask, sterile gowns, sterile gloves, sterile drape, hand hygiene and skin antiseptic. A timeout was performed prior to the initiation of the procedure. The patient was positioned prone and non-contrast localization CT was performed of the pelvis to demonstrate the iliac marrow spaces. Maximal barrier sterile technique utilized including caps, mask, sterile gowns, sterile gloves, large sterile drape, hand hygiene, and betadine prep. Under sterile conditions and local anesthesia, an 11 gauge coaxial bone biopsy needle was advanced into the right iliac marrow space. Needle position was confirmed with CT imaging. Initially, bone marrow aspiration was performed. Next, the 11 gauge outer cannula was utilized to obtain a right iliac bone marrow core biopsy. Needle was removed. Hemostasis was obtained with compression. The patient tolerated the procedure well. Samples were prepared with the cytotechnologist. IMPRESSION: Technically successful CT-guided bone marrow biopsy and aspiration of the right iliac bone. Electronically Signed   By: Malachy Moan M.D.   On: 11/27/2022 13:17     Medications:      amLODipine  10 mg Oral Daily   aspirin EC  81 mg Oral Daily   enoxaparin (LOVENOX)  injection  30 mg Subcutaneous Q24H   fentaNYL       insulin aspart  0-15 Units Subcutaneous TID WC   insulin aspart  0-5 Units Subcutaneous QHS   melatonin  2.5 mg Oral QHS   midazolam       pantoprazole  40 mg Oral BID   QUEtiapine  25 mg Oral QHS   sertraline  25 mg Oral Daily   tamsulosin  0.4 mg Oral Daily   acetaminophen **OR** acetaminophen, fentaNYL, hydrALAZINE, labetalol, midazolam, ondansetron **OR** ondansetron (ZOFRAN) IV  Assessment/ Plan:  [name] is a 88 y.o.  male with Alzheimer's dementia, BPH, Hairy cell leukemia, hypertension, hyperlipidemia, diabetes mellitus type II, diabetic retinopathy, , who was admitted to [facility] on 11/19/2022 for Generalized weakness [R53.1] AKI (acute kidney injury) [N17.9] Acute renal failure superimposed on stage 3a chronic kidney disease [N17.9, N18.31]   Acute kidney injury: on chronic kidney disease stage IIIA with baseline creatinine of 1.45, GFR of 47 on 08/27/2022. Patient with history of hematuria, proteinuria and glycosuria. Consistent with diabetic nephropathy. Acute kidney injury most likely secondary to prerenal azotemia. Not on ACE-I/ARB,  no diuretic, no SGLT-2 inhibitor as outpatient. No obstruction on ultrasound. No IV contrast exposure. No exposure to any other nephrotoxic agents. - UOP 1.25L in proceeding 24 hours.  - Renal function returning to baseline -Will follow-up in our office at discharge.   Diabetes mellitus type II with chronic kidney disease and renal manifestations. Insulin dependent. Not on SGLT-2 or ACE-I/ARB  Glucose elevated today, SSI managed by primary team   Hypertension with chronic kidney disease: also takes doxazosin at home.  - on amlodipine and tamsulosin.  - Blood pressure 164/68 - recommend Losartan 25mg  daily.    Anemia with chronic kidney disease, with history of smoldering myeloma and remission of Hairy cell leukemia. Followed by Dr. Smith Robert, [facility].  Blood transfusion received  during this admission. Stable today.    LOS: 7   4/10/20241:37 PM

## 2022-11-27 NOTE — Procedures (Signed)
Interventional Radiology Procedure Note  Procedure: CT guided aspirate and core biopsy of right iliac bone Complications: None Recommendations: - Bedrest supine x 1 hrs - Hydrocodone PRN  Pain - Follow biopsy results  Signed,  Vivion Romano K. Nishka Heide, MD   

## 2022-11-27 NOTE — Discharge Summary (Signed)
Physician Discharge Summary   Patient: Alex Smith MRN: [id number] DOB: 12/17/1934  Admit date:     11/19/2022  Discharge date: 11/27/22  Discharge Physician: Alford Highlandichard Diamantina Edinger   PCP: Almetta LovelyHousecalls, [facility] Making   Recommendations at discharge:   Follow-up team at rehab 1 day Follow-up with Dr. Smith Robertao oncology.  She will follow-up on marrow biopsy results continue Follow-up nephrology in few weeks.  Discharge Diagnoses: Principal Problem:   Generalized weakness Active Problems:   Acute renal failure superimposed on stage 3a chronic kidney disease   BPH (benign prostatic hyperplasia)   Alzheimer's dementia   Essential hypertension   Type 2 diabetes mellitus with stage 3 chronic kidney disease   Hairy cell leukemia, in remission   Monoclonal gammopathy   Smoldering myeloma   Overweight (BMI 25.0-29.9)   Pancytopenia   Metabolic acidosis  Resolved Problems:   * No resolved hospital problems. *  Hospital Course: Alex Smith is a 88 y.o. male with medical history significant for HTN, smoldering myeloma, hairy cell leukemia, MDS on Luspatercept for anemia, followed by Dr. Smith Robertao of oncology, as well as insulin-dependent type 2 diabetes, BPH, CKD 3A, Alzheimer's dementia, HTN, who was sent to the ED from ALF for evaluation of weakness and dysuria and concern for UTI.  Patient has been constipated, nausea, reduced liquid intake, he has acute on chronic renal failure.  Patient renal function does not improve after giving fluids, more anemic.  Consult obtained from oncology as well as nephrology. Hemoglobin dropped down to 6.9 on 4/8, given 1 unit PRBC.  Renal function improved after fluids. Patient is seen by PT/OT, recommended nursing home placement.  Bone marrow biopsy done on 4/10.  Patient going to rehab on 4/10.  Creatinine down to 1.89 with a GFR of 34.  Nephrology okay with following up as outpatient.  White blood cell count 3.0, hemoglobin 8.3 and platelet count 111.  Dr. Smith Robertao okay  following up as outpatient.   Assessment and Plan: * Generalized weakness Patient will go out to rehab today.  Will hold statin just in case contributing to his weakness.  Acute renal failure superimposed on stage 3a chronic kidney disease Creatinine peaked at 2.51 on 11/22/2022.  Creatinine 1.89 upon discharge with a GFR of 34.  As per nephrology okay to follow-up as outpatient and can start losartan instead of lisinopril as outpatient.  Follow-up in 2 weeks with nephrology.  BPH (benign prostatic hyperplasia) Continue tamsulosin  Alzheimer's dementia Continue Aricept.  Seroquel only at night if agitated or having trouble sleeping.  Pancytopenia Bone marrow biopsy done on 410.  Follow-up results as outpatient.  Hairy cell leukemia, in remission Smoldering myeloma MDS Patient not currently on chemo Gets Luspatercept for anemia Follow-up with Dr. Smith Robertao as outpatient. Had bone marrow biopsy on 4/10.  Results will need to be followed up as outpatient.  Type 2 diabetes mellitus with stage 3 chronic kidney disease Will start low-dose insulin back tonight 6 units.  Will give 2 units prior to meals.  Continue to monitor sugars before every meal and nightly.  Essential hypertension Continue amlodipine.  Will start losartan tomorrow.         Consultants: Oncology and nephrology Procedures performed: Bone marrow biopsy Disposition: Rehabilitation facility Diet recommendation:  Cardiac and Carb modified diet DISCHARGE MEDICATION: Allergies as of 11/27/2022       Reactions   Rituximab Rash   Chest tightness Chest tightness   Blood-group Specific Substance Other (See Comments)   Had a post  transfusion reaction of red blood cells; NOW REQUIRES WASHED BLOOD CELLS Had a post transfusion reaction of red blood cells; NOW REQUIRES WASHED BLOOD CELLS   Primaxin [imipenem] Other (See Comments)   Possible allergy Tolerates cephalosporins   Voriconazole Other (See Comments)   Sulfa  Antibiotics Itching, Rash   Sulfacetamide Sodium Itching, Rash        Medication List     STOP taking these medications    doxazosin 1 MG tablet Commonly known as: CARDURA   famotidine 20 MG tablet Commonly known as: PEPCID   feeding supplement Liqd   lisinopril 5 MG tablet Commonly known as: ZESTRIL   Mucinex DM 30-600 MG Tb12   pravastatin 20 MG tablet Commonly known as: PRAVACHOL   sitaGLIPtin 50 MG tablet Commonly known as: JANUVIA       TAKE these medications    acetaminophen 500 MG tablet Commonly known as: TYLENOL Take 2 tablets (1,000 mg total) by mouth 2 (two) times daily. What changed:  when to take this reasons to take this   albuterol 108 (90 Base) MCG/ACT inhaler Commonly known as: VENTOLIN HFA Inhale 2 puffs into the lungs every 6 (six) hours as needed for wheezing or shortness of breath.   alum & mag hydroxide-simeth 200-200-20 MG/5ML suspension Commonly known as: MAALOX/MYLANTA Take 30 mLs by mouth every 4 (four) hours as needed for indigestion or heartburn.   amLODipine 10 MG tablet Commonly known as: NORVASC Take 1 tablet (10 mg total) by mouth daily.   Aricept 5 MG tablet Generic drug: donepezil Take 5 mg by mouth daily.   aspirin EC 81 MG tablet Take 81 mg by mouth daily.   cyanocobalamin 1000 MCG tablet Commonly known as: VITAMIN B12 Take 1 tablet (1,000 mcg total) by mouth daily.   insulin glargine 100 UNIT/ML Solostar Pen Commonly known as: LANTUS Inject 6 Units into the skin at bedtime. What changed: how much to take   insulin lispro 100 UNIT/ML KwikPen Commonly known as: HUMALOG Inject 2 Units into the skin 3 (three) times daily before meals. Per sliding scale What changed: how much to take   losartan 25 MG tablet Commonly known as: Cozaar Take 1 tablet (25 mg total) by mouth daily.   melatonin 5 MG Tabs Take 5 mg by mouth at bedtime.   multivitamin with minerals Tabs tablet Take 1 tablet by mouth daily.    pantoprazole 40 MG tablet Commonly known as: PROTONIX Take 1 tablet (40 mg total) by mouth 2 (two) times daily.   QUEtiapine 25 MG tablet Commonly known as: SEROQUEL Take 0.5 tablets (12.5 mg total) by mouth at bedtime as needed (insomnia, agitation).   sertraline 25 MG tablet Commonly known as: ZOLOFT Take 25 mg by mouth daily.   tamsulosin 0.4 MG Caps capsule Commonly known as: FLOMAX TAKE 1 CAPSULE BY MOUTH EVERY DAY   vitamin E 180 MG (400 UNITS) capsule Take 400 Units by mouth daily.        Contact information for follow-up providers     Mosetta Pigeon, MD Follow up in 2 week(s).   Specialty: Nephrology Contact information: [address] [place] [address] [phone]         Creig Hines, MD Follow up in 2 week(s).   Specialty: Oncology Contact information: [address] [place] [address] [phone]              Contact information for after-discharge care     Destination  [facility] Preferred SNF .   Service: Skilled Nursing Contact information: [address] [place] [address] [phone]                    Discharge Exam: Ceasar Mons Weights   11/18/22 2314  Weight: 86.2 kg   Physical Exam HENT:     Head: Normocephalic.     Mouth/Throat:     Pharynx: No oropharyngeal exudate.  Eyes:     General: Lids are normal.     Conjunctiva/sclera: Conjunctivae normal.  Cardiovascular:     Rate and Rhythm: Normal rate and regular rhythm.     Heart sounds: Normal heart sounds, S1 normal and S2 normal.  Pulmonary:     Breath sounds: No decreased breath sounds, wheezing, rhonchi or rales.  Abdominal:     Palpations: Abdomen is soft.     Tenderness: There is no abdominal tenderness.  Musculoskeletal:     Right lower leg: No swelling.     Left lower leg: No swelling.  Skin:    General: Skin is warm.     Findings: No rash.  Neurological:     Mental Status:  He is alert and oriented to person, place, and time.      Condition at discharge: stable  The results of significant diagnostics from this hospitalization (including imaging, microbiology, ancillary and laboratory) are listed below for reference.   Imaging Studies: US RENAL  Result Date: 11/19/2022 CLINICAL DATA:  Acute on chronic renal insufficiency EXAM: RENAL / URINARY TRACT ULTRASOUND COMPLETE COMPARISON:  04/09/2022 FINDINGS: Right Kidney: Renal measurements: 10.2 x 6.1 x 4.8 cm = volume: 155.6 mL. Echogenicity within normal limits. Benign simple cortical cyst measuring 1.2 cm upper pole. No hydronephrosis or nephrolithiasis. Left Kidney: Renal measurements: 12.7 x 6.5 x 5.8 cm = volume: 240.9 mL. Echogenicity within normal limits. 1.1 cm simple cyst upper pole left kidney. The larger simple cysts seen posteriorly off the left kidney on prior CT is not well visualized. No hydronephrosis or nephrolithiasis. Bladder: Appears normal for degree of bladder distention. Other: Incidental cholelithiasis.  No evidence of cholecystitis. IMPRESSION: 1. Unremarkable appearance of the bilateral kidneys. 2. Incidental cholelithiasis.  No evidence of cholecystitis. Electronically Signed   By: Sharlet Salina M.D.   On: 11/19/2022 18:22   DG Chest 2 View  Result Date: 11/18/2022 CLINICAL DATA:  Generalized weakness EXAM: CHEST - 2 VIEW COMPARISON:  04/09/2022 FINDINGS: Cardiac and mediastinal contours are unchanged. No focal pulmonary opacity. No pleural effusion or pneumothorax. No acute osseous abnormality. IMPRESSION: No acute cardiopulmonary process. Electronically Signed   By: Wiliam Ke M.D.   On: 11/18/2022 23:48    Microbiology: Results for orders placed or performed during the hospital encounter of 11/19/22  Urine Culture (for pregnant, neutropenic or urologic patients or patients with an indwelling urinary catheter)     Status: None   Collection Time: 11/19/22  1:11 AM   Specimen: Urine, Random   Result Value Ref Range Status   Specimen Description   Final    URINE, RANDOM Performed at [facility], [address] [place], [place] [address]    Special Requests   Final    NONE Performed at [facility], [address], [place], [place] [address]    Culture   Final    NO GROWTH Performed at [facility], [address] [place], [place] [address]    Report Status 11/20/2022 FINAL  Final   *  Note: Due to a large number of results and/or encounters for the requested time period, some results have not been displayed. A complete set of results can be found in Results Review.    Labs: CBC: Recent Labs  Lab 11/23/22 0548 11/24/22 0523 11/25/22 0628 11/26/22 0451 11/27/22 0556  WBC 4.7 3.4* 2.7* 3.6* 3.0*  NEUTROABS  --   --   --   --  2.0  HGB 7.5* 7.3* 6.9* 8.3* 8.3*  HCT 23.6* 23.1* 21.7* 26.4* 26.2*  MCV 100.4* 100.0 100.0 98.1 97.0  PLT 100* 103* 101* 116* 111*   Basic Metabolic Panel: Recent Labs  Lab 11/21/22 0531 11/22/22 0510 11/23/22 0548 11/24/22 0523 11/25/22 0628 11/26/22 0451  NA 135 136 135 135 136 137  K 4.2 4.3 4.0 3.9 4.3 4.0  CL 107 109 103 104 105 104  CO2 21* 21* 24 25 25 27   GLUCOSE 186* 289* 201* 152* 181* 195*  BUN 33* 37* 35* 31* 32* 30*  CREATININE 2.37* 2.51* 2.29* 2.28* 2.11* 1.89*  CALCIUM 8.0* 8.1* 8.0* 7.9* 7.9* 7.9*  MG 2.1  --  2.1 2.2  --   --    Liver Function Tests: Recent Labs  Lab 11/21/22 0932  AST 41  ALT 24  ALKPHOS 72  BILITOT 0.8  PROT 7.2  ALBUMIN 2.9*   CBG: Recent Labs  Lab 11/26/22 0813 11/26/22 1157 11/26/22 1617 11/26/22 2122 11/27/22 0712  GLUCAP 198* 295* 244* 124* 191*    Discharge time spent: greater than 30 minutes.  Signed: Alford Highland, MD [facility] 11/27/2022

## 2022-11-27 NOTE — Assessment & Plan Note (Signed)
Bone marrow biopsy done on 410.  Follow-up results as outpatient.

## 2022-12-02 LAB — SURGICAL PATHOLOGY

## 2022-12-04 ENCOUNTER — Encounter (HOSPITAL_COMMUNITY): Payer: Self-pay | Admitting: Oncology

## 2022-12-09 ENCOUNTER — Encounter (HOSPITAL_COMMUNITY): Payer: Self-pay | Admitting: Oncology

## 2022-12-18 ENCOUNTER — Inpatient Hospital Stay (HOSPITAL_BASED_OUTPATIENT_CLINIC_OR_DEPARTMENT_OTHER): Payer: PPO | Admitting: Oncology

## 2022-12-18 ENCOUNTER — Inpatient Hospital Stay: Payer: PPO | Attending: Nurse Practitioner

## 2022-12-18 ENCOUNTER — Other Ambulatory Visit: Payer: Self-pay | Admitting: *Deleted

## 2022-12-18 ENCOUNTER — Encounter: Payer: Self-pay | Admitting: Oncology

## 2022-12-18 ENCOUNTER — Inpatient Hospital Stay: Payer: PPO

## 2022-12-18 VITALS — BP 146/74 | HR 72 | Temp 97.7°F | Resp 18 | Ht 67.0 in | Wt 196.5 lb

## 2022-12-18 DIAGNOSIS — D46Z Other myelodysplastic syndromes: Secondary | ICD-10-CM

## 2022-12-18 DIAGNOSIS — D472 Monoclonal gammopathy: Secondary | ICD-10-CM

## 2022-12-18 DIAGNOSIS — R221 Localized swelling, mass and lump, neck: Secondary | ICD-10-CM

## 2022-12-18 DIAGNOSIS — Z5111 Encounter for antineoplastic chemotherapy: Secondary | ICD-10-CM | POA: Insufficient documentation

## 2022-12-18 LAB — CBC WITH DIFFERENTIAL/PLATELET
Abs Immature Granulocytes: 0.02 10*3/uL (ref 0.00–0.07)
Basophils Absolute: 0 10*3/uL (ref 0.0–0.1)
Basophils Relative: 1 %
Eosinophils Absolute: 0.1 10*3/uL (ref 0.0–0.5)
Eosinophils Relative: 3 %
HCT: 25.6 % — ABNORMAL LOW (ref 39.0–52.0)
Hemoglobin: 8.1 g/dL — ABNORMAL LOW (ref 13.0–17.0)
Immature Granulocytes: 1 %
Lymphocytes Relative: 28 %
Lymphs Abs: 0.9 10*3/uL (ref 0.7–4.0)
MCH: 31.6 pg (ref 26.0–34.0)
MCHC: 31.6 g/dL (ref 30.0–36.0)
MCV: 100 fL (ref 80.0–100.0)
Monocytes Absolute: 0.2 10*3/uL (ref 0.1–1.0)
Monocytes Relative: 5 %
Neutro Abs: 2.1 10*3/uL (ref 1.7–7.7)
Neutrophils Relative %: 62 %
Platelets: 117 10*3/uL — ABNORMAL LOW (ref 150–400)
RBC: 2.56 MIL/uL — ABNORMAL LOW (ref 4.22–5.81)
RDW: 18.6 % — ABNORMAL HIGH (ref 11.5–15.5)
WBC: 3.4 10*3/uL — ABNORMAL LOW (ref 4.0–10.5)
nRBC: 0 % (ref 0.0–0.2)

## 2022-12-18 LAB — COMPREHENSIVE METABOLIC PANEL
ALT: 9 U/L (ref 0–44)
AST: 17 U/L (ref 15–41)
Albumin: 3.5 g/dL (ref 3.5–5.0)
Alkaline Phosphatase: 73 U/L (ref 38–126)
Anion gap: 7 (ref 5–15)
BUN: 29 mg/dL — ABNORMAL HIGH (ref 8–23)
CO2: 21 mmol/L — ABNORMAL LOW (ref 22–32)
Calcium: 8.3 mg/dL — ABNORMAL LOW (ref 8.9–10.3)
Chloride: 104 mmol/L (ref 98–111)
Creatinine, Ser: 1.66 mg/dL — ABNORMAL HIGH (ref 0.61–1.24)
GFR, Estimated: 40 mL/min — ABNORMAL LOW (ref >60)
Glucose, Bld: 274 mg/dL — ABNORMAL HIGH (ref 70–99)
Potassium: 3.9 mmol/L (ref 3.5–5.1)
Sodium: 132 mmol/L — ABNORMAL LOW (ref 135–145)
Total Bilirubin: 0.5 mg/dL (ref 0.3–1.2)
Total Protein: 7.5 g/dL (ref 6.5–8.1)

## 2022-12-18 LAB — FERRITIN: Ferritin: 170 ng/mL (ref 24–336)

## 2022-12-18 LAB — IRON AND TIBC
Iron: 91 ug/dL (ref 45–182)
Saturation Ratios: 31 % (ref 17.9–39.5)
TIBC: 298 ug/dL (ref 250–450)
UIBC: 207 ug/dL

## 2022-12-18 LAB — VITAMIN B12: Vitamin B-12: 726 pg/mL (ref 180–914)

## 2022-12-18 LAB — FOLATE: Folate: 11.6 ng/mL (ref >5.9)

## 2022-12-18 MED ORDER — LUSPATERCEPT-AAMT 75 MG ~~LOC~~ SOLR
100.0000 mg | SUBCUTANEOUS | Status: DC
  Administered 2022-12-18: 100 mg via SUBCUTANEOUS
  Filled 2022-12-18: qty 1.5

## 2022-12-19 LAB — KAPPA/LAMBDA LIGHT CHAINS
Kappa free light chain: 82 mg/L — ABNORMAL HIGH (ref 3.3–19.4)
Kappa, lambda light chain ratio: 5.13 — ABNORMAL HIGH (ref 0.26–1.65)
Lambda free light chains: 16 mg/L (ref 5.7–26.3)

## 2022-12-23 LAB — MULTIPLE MYELOMA PANEL, SERUM
Albumin SerPl Elph-Mcnc: 3.6 g/dL (ref 2.9–4.4)
Albumin/Glob SerPl: 1.1 (ref 0.7–1.7)
Alpha 1: 0.2 g/dL (ref 0.0–0.4)
Alpha2 Glob SerPl Elph-Mcnc: 0.7 g/dL (ref 0.4–1.0)
B-Globulin SerPl Elph-Mcnc: 0.7 g/dL (ref 0.7–1.3)
Gamma Glob SerPl Elph-Mcnc: 1.7 g/dL (ref 0.4–1.8)
Globulin, Total: 3.3 g/dL (ref 2.2–3.9)
IgA: 20 mg/dL — ABNORMAL LOW (ref 61–437)
IgG (Immunoglobin G), Serum: 1966 mg/dL — ABNORMAL HIGH (ref 603–1613)
IgM (Immunoglobulin M), Srm: 30 mg/dL (ref 15–143)
M Protein SerPl Elph-Mcnc: 1.4 g/dL — ABNORMAL HIGH
Total Protein ELP: 6.9 g/dL (ref 6.0–8.5)

## 2022-12-25 DIAGNOSIS — R600 Localized edema: Secondary | ICD-10-CM | POA: Insufficient documentation

## 2022-12-25 DIAGNOSIS — I129 Hypertensive chronic kidney disease with stage 1 through stage 4 chronic kidney disease, or unspecified chronic kidney disease: Secondary | ICD-10-CM | POA: Insufficient documentation

## 2022-12-26 ENCOUNTER — Ambulatory Visit
Admission: RE | Admit: 2022-12-26 | Discharge: 2022-12-26 | Disposition: A | Discharge status: Home / Self Care | Payer: PPO | Source: Ambulatory Visit | Attending: Oncology | Admitting: Oncology

## 2022-12-26 DIAGNOSIS — D46Z Other myelodysplastic syndromes: Secondary | ICD-10-CM | POA: Insufficient documentation

## 2022-12-26 DIAGNOSIS — R221 Localized swelling, mass and lump, neck: Secondary | ICD-10-CM | POA: Diagnosis present

## 2022-12-26 DIAGNOSIS — D472 Monoclonal gammopathy: Secondary | ICD-10-CM | POA: Insufficient documentation

## 2023-01-05 ENCOUNTER — Encounter: Payer: Self-pay | Admitting: Hematology and Oncology

## 2023-01-05 NOTE — Progress Notes (Signed)
Hematology/Oncology Consult note [facility]  [phone] [phone]  Patient Care Team: [facility], [facility] as PCP - General (Geriatric Medicine) Rozetta Nunnery, MD as Referring Physician (Hematology and Oncology) Rozetta Nunnery, MD as Referring Physician (Hematology and Oncology)   Name of the patient: Alex Smith  [id number]  1935-07-15   Date of visit: 01/05/23  Diagnosis- History of hairy cell leukemia 2.  Smoldering multiple myeloma under observation 3.  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 [name] and is transitioning his care to me.  He has following issues:   1.  Hairy cell leukemia that was initially diagnosed on bone marrow biopsy in June 2016.  Bone marrow biopsy at that time showed extensive marrow involvement with recurrent hairy cell leukemia approximately 80% of the cells in the core 10% monoclonal plasma cell infiltrate compatible with plasma cell neoplasm.  Peripheral smear revealed leukopenia with a white count of 1900 compatible with hairy cell leukemia. 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  FISH studies for MDS were negative.  Cytogenetics were normal (46, XY).  He has last received cladribine for this back in July 2016   2.  Smoldering multiple myeloma: Bone marrow biopsy in March 2017 revealed persistent plasma cell neoplasm with monoclonal plasma cells estimated at 20 to 30%.  No morphologic evidence of residual hairy cell leukemia.Marrow was normocellular for age with relative erythroid hyperplasia, relative myeloid hypoplasia, mild dyspoiesis, adequate megakaryocytes and no increased blasts.  There was no significant increase in marrow reticulin fibers.  Iron was present.  FISH studies for MDS were  negative.  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  Cytogenetics were normal (46, XY).  Last bone marrow biopsy was in October 2018 which showed variably cellular marrow 10 to 50% with kappa restricted plasmacytosis of 10 to 15%.  No evidence of leukemia lymphoma or high-grade dysplasia.  Normal cytogenetics.  He has also been followed by Dr. [name] at [facility].  He has received Revlimid and Decadron in the past in 2017 which was subsequently stopped.  PET scan in July 2017 showed no hypermetabolic adenopathy or focal skeletal lesions.   3.  MDS: He has received a trial of Procrit in the past in 2017 but apparently did not respond to it.  He has been getting periodic blood transfusions to maintain his hemoglobin closer to 9 as he is symptomatic when his hemoglobin is less than 9.  He has been getting about 2-3 blood transfusions during the year.  He does have some mild leukopenia/neutropenia and is on acyclovir prophylaxis.  He was getting weekly G-CSF when his ANC is less than 1.5.  He is not presently on G-CSF.  He is currently on Luspatercept for his anemia.   4.  Also has history of B12 deficiency for which he is on oral B12.        Interval history-He has recovered well from his recent hospitalization.  He has chronic fatigue.  Reports right neck swelling  ECOG PS- 2 Pain scale- 0  Review of systems- Review of Systems  Constitutional:  Positive for malaise/fatigue. Negative for chills, fever and weight loss.  HENT:  Negative for congestion, ear discharge and nosebleeds.   Eyes:  Negative for blurred  vision.  Respiratory:  Negative for cough, hemoptysis, sputum production, shortness of breath and wheezing.   Cardiovascular:  Negative for chest pain, palpitations, orthopnea and claudication.  Gastrointestinal:  Negative for abdominal pain, blood in stool, constipation, diarrhea, heartburn, melena, nausea and vomiting.  Genitourinary:  Negative  for dysuria, flank pain, frequency, hematuria and urgency.  Musculoskeletal:  Negative for back pain, joint pain and myalgias.  Skin:  Negative for rash.  Neurological:  Negative for dizziness, tingling, focal weakness, seizures, weakness and headaches.  Endo/Heme/Allergies:  Does not bruise/bleed easily.  Psychiatric/Behavioral:  Negative for depression and suicidal ideas. The patient does not have insomnia.       Allergies  Allergen Reactions   Rituximab Rash    Chest tightness Chest tightness   Blood-Group Specific Substance Other (See Comments)    Had a post transfusion reaction of red blood cells; NOW REQUIRES WASHED BLOOD CELLS Had a post transfusion reaction of red blood cells; NOW REQUIRES WASHED BLOOD CELLS   Primaxin [Imipenem] Other (See Comments)    Possible allergy Tolerates cephalosporins   Voriconazole Other (See Comments)   Sulfa Antibiotics Itching and Rash   Sulfacetamide Sodium Itching and Rash     Past Medical History:  Diagnosis Date   Alzheimer's dementia (HCC) 08/18/2021   Anemia    B12 deficiency    Basal cell carcinoma 03/30/2018   left ant nasal ala   Basal cell carcinoma of face 01/19/2013   right distal dorsum nose   BPH (benign prostatic hypertrophy)    followed by urology, discharged (Dr. 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 pretibial. 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 EDC  09/07/2020   Squamous cell carcinoma of skin 04/03/2020   Left cheek. WD SCC   Systolic murmur 05/28/2016   Type 2 diabetes, controlled, with retinopathy (HCC)      Past Surgical History:  Procedure Laterality Date   25 GAUGE PARS PLANA VITRECTOMY WITH 20 GAUGE MVR PORT FOR MACULAR HOLE  07/07/2012   Procedure: 25 GAUGE PARS PLANA VITRECTOMY WITH 20 GAUGE MVR PORT FOR MACULAR HOLE;  Surgeon: Sherrie George, MD;  Location: [facility] OR;  Service: Ophthalmology;  Laterality: Left;   BONE MARROW BIOPSY  2016   CARDIOVASCULAR STRESS TEST  2013   treadmill - no evidence ischemia, EF 61%   CATARACT EXTRACTION W/ INTRAOCULAR LENS  IMPLANT, BILATERAL  ~ 2010   COLONOSCOPY  2014   Elliot WNL no rpt needed, h/o polyps   EYE SURGERY Left 06/2012   laser surgery   GAS INSERTION  07/07/2012   Procedure: INSERTION OF GAS;  Surgeon: Sherrie George, MD;  Location: [facility] OR;  Service: Ophthalmology;  Laterality: Left;  [id number]   PERIPHERAL VASCULAR CATHETERIZATION N/A 02/23/2015   Procedure: Shelda Pal Cath Insertion;  Surgeon: Annice Needy, MD;  Location: [facility]  INVASIVE CV LAB;  Service: Cardiovascular;  Laterality: N/A;   PORTA CATH REMOVAL N/A 02/16/2020   Procedure: PORTA CATH REMOVAL;  Surgeon: Annice Needy, MD;  Location: [facility] INVASIVE CV LAB;  Service: Cardiovascular;  Laterality: N/A;   SERUM PATCH  07/07/2012   Procedure: SERUM PATCH;  Surgeon: Sherrie George, MD;  Location: [facility] OR;  Service: Ophthalmology;  Laterality: Left;   SKIN CANCER EXCISION     "all over my face" (07/07/2012)    Social History   Socioeconomic History   Marital status: Married    Spouse name: Not on file   Number of children: Not on file   Years of education: Not on file   Highest education level: Not on file  Occupational History   Not on file  Tobacco Use   Smoking status: Former    Packs/day: 2.00    Years: 25.00    Additional pack years: 0.00    Total pack years: 50.00    Types: Cigarettes    Quit date: 02/06/1969     Years since quitting: 53.9   Smokeless tobacco: Never   Tobacco comments:    07/07/2012 "stopped smoking ~ 40 yr ago; smoked 20-60yr"  Vaping Use   Vaping Use: Never used  Substance and Sexual Activity   Alcohol use: Not Currently   Drug use: No   Sexual activity: Never  Other Topics Concern   Not on file  Social History Narrative   Lives at home alone. 1 cat   Occupation: was Tree surgeon, works part time for [employer]   Activity: likes to golf   Diet: moderate water, fruits/vegetables daily   Social Determinants of Corporate investment banker Strain: Not on file  Food Insecurity: Not on file  Transportation Needs: Not on file  Physical Activity: Not on file  Stress: Not on file  Social Connections: Not on file  Intimate Partner Violence: Not on file    Family History  Problem Relation Age of Onset   Dementia Mother    Heart failure Father 59   Cancer Sister        breast   Diabetes Paternal Uncle    Diabetes Paternal Aunt    CAD Brother 24       MI   Stroke Neg Hx      Current Outpatient Medications:    acetaminophen (TYLENOL) 500 MG tablet, Take 2 tablets (1,000 mg total) by mouth 2 (two) times daily., Disp: 120 tablet, Rfl: 0   albuterol (VENTOLIN HFA) 108 (90 Base) MCG/ACT inhaler, Inhale 2 puffs into the lungs every 6 (six) hours as needed for wheezing or shortness of breath., Disp: , Rfl:    alum & mag hydroxide-simeth (MAALOX/MYLANTA) 200-200-20 MG/5ML suspension, Take 30 mLs by mouth every 4 (four) hours as needed for indigestion or heartburn., Disp: 355 mL, Rfl: 0   amLODipine (NORVASC) 10 MG tablet, Take 1 tablet (10 mg total) by mouth daily., Disp: , Rfl:    ARICEPT 5 MG tablet, Take 5 mg by mouth daily., Disp: , Rfl:    aspirin EC 81 MG tablet, Take 81 mg by mouth daily., Disp: , Rfl:    insulin glargine (LANTUS) 100 UNIT/ML Solostar Pen, Inject 6 Units into the skin at bedtime., Disp: 15 mL, Rfl: 11   insulin lispro (HUMALOG) 100 UNIT/ML KwikPen,  Inject 2 Units into the skin 3 (three) times daily before meals. Per sliding scale, Disp: 15 mL, Rfl: 11   losartan (COZAAR) 25  MG tablet, Take 1 tablet (25 mg total) by mouth daily., Disp: 30 tablet, Rfl: 0   melatonin 5 MG TABS, Take 5 mg by mouth at bedtime., Disp: , Rfl:    Multiple Vitamin (MULTIVITAMIN WITH MINERALS) TABS tablet, Take 1 tablet by mouth daily., Disp: , Rfl:    pantoprazole (PROTONIX) 40 MG tablet, Take 1 tablet (40 mg total) by mouth 2 (two) times daily., Disp: , Rfl:    QUEtiapine (SEROQUEL) 25 MG tablet, Take 0.5 tablets (12.5 mg total) by mouth at bedtime as needed (insomnia, agitation)., Disp: 15 tablet, Rfl: 0   sertraline (ZOLOFT) 25 MG tablet, Take 25 mg by mouth daily., Disp: , Rfl:    tamsulosin (FLOMAX) 0.4 MG CAPS capsule, TAKE 1 CAPSULE BY MOUTH EVERY DAY (Patient taking differently: Take 0.4 mg by mouth daily.), Disp: 30 capsule, Rfl: 3   vitamin B-12 (CYANOCOBALAMIN) 1000 MCG tablet, Take 1 tablet (1,000 mcg total) by mouth daily., Disp: 90 tablet, Rfl: 3   vitamin E 400 UNIT capsule, Take 400 Units by mouth daily., Disp: , Rfl:  No current facility-administered medications for this visit.  Facility-Administered Medications Ordered in Other Visits:    heparin lock flush 100 unit/mL, 500 Units, Intravenous, Once, Corcoran, Melissa C, MD   luspatercept-aamt (REBLOZYL) subcutaneous injection 100 mg, 100 mg, Subcutaneous, Q21 days, Creig Hines, MD, 100 mg at 08/27/22 1206   sodium chloride 0.9 % injection 10 mL, 10 mL, Intravenous, PRN, [name], Melissa C, MD, 10 mL at 03/03/15 0903   sodium chloride 0.9 % injection 10 mL, 10 mL, Intracatheter, PRN, [name], Melissa C, MD, 10 mL at 03/10/15 1410   sodium chloride flush (NS) 0.9 % injection 10 mL, 10 mL, Intravenous, PRN, Nelva Nay C, MD, 10 mL at 07/23/18 1517  Physical exam:  Vitals:   12/18/22 0937 12/18/22 0945  BP: (!) 162/69 (!) 146/74  Pulse: 74 72  Resp: 18   Temp: 97.7 F (36.5 C)    TempSrc: Tympanic   SpO2: 98%   Weight: 196 lb 8 oz (89.1 kg)   Height: 5\' 7"  (1.702 m)    Physical Exam Cardiovascular:     Rate and Rhythm: Normal rate and regular rhythm.     Heart sounds: Normal heart sounds.  Pulmonary:     Effort: Pulmonary effort is normal.     Breath sounds: Normal breath sounds.  Abdominal:     General: Bowel sounds are normal.     Palpations: Abdomen is soft.  Musculoskeletal:     Cervical back: Normal range of motion.  Lymphadenopathy:     Comments: Firm swelling noted in the submandibular area which appears to be an enlarged submandibular gland  Skin:    General: Skin is warm and dry.  Neurological:     Mental Status: He is alert and oriented to person, place, and time.         Latest Ref Rng & Units 12/18/2022    9:24 AM  CMP  Glucose 70 - 99 mg/dL 161   BUN 8 - 23 mg/dL 29   Creatinine 0.96 - 1.24 mg/dL 0.45   Sodium 409 - 811 mmol/L 132   Potassium 3.5 - 5.1 mmol/L 3.9   Chloride 98 - 111 mmol/L 104   CO2 22 - 32 mmol/L 21   Calcium 8.9 - 10.3 mg/dL 8.3   Total Protein 6.5 - 8.1 g/dL 7.5   Total Bilirubin 0.3 - 1.2 mg/dL 0.5   Alkaline Phos 38 - 126  U/L 73   AST 15 - 41 U/L 17   ALT 0 - 44 U/L 9       Latest Ref Rng & Units 12/18/2022    9:24 AM  CBC  WBC 4.0 - 10.5 K/uL 3.4   Hemoglobin 13.0 - 17.0 g/dL 8.1   Hematocrit 16.1 - 52.0 % 25.6   Platelets 150 - 400 K/uL 117     No images are attached to the encounter.  US Soft Tissue Head/Neck  Result Date: 12/28/2022 CLINICAL DATA:  Palpable right-sided neck mass for the past 3-4 months. History of myeloma. EXAM: ULTRASOUND OF HEAD/NECK SOFT TISSUES TECHNIQUE: Ultrasound examination of the head and neck soft tissues was performed in the area of clinical concern. COMPARISON:  Neck CT-08/18/2021 FINDINGS: Sonographic evaluation of patient's palpable area of concern correlates with an ill-defined hypoechoic mass measuring approximately 5.1 x 3.7 x 2.6 cm. It is uncertain whether this  mass is associated with the submandibular gland versus represents a pathologically enlarged and abnormal appearing cervical lymph node. Sonographic evaluation of the contralateral left side of the neck is negative for sonographic correlate. IMPRESSION: Sonographic evaluation of patient's palpable area of concern within the right-side of the neck correlates with an approximately 5.1 cm ill-defined hypoechoic mass. It is uncertain whether this mass is associated with the right submandibular gland versus represents an abnormal appearing cervical lymph node though is new compared to remote cervical spine CT performed 07/2021. Further evaluation with contrast-enhanced neck CT is advised. These results will be called to the ordering clinician or representative by the Radiologist Assistant, and communication documented in the PACS or [employer]. Electronically Signed   By: Simonne Come M.D.   On: 12/28/2022 13:40     Assessment and plan- Patient is a 88 y.o. male who is here for follow-up of following issues:  Smoldering multiple myeloma: Labs so far including myeloma panel and serumFree light chains have remained stable and not indicated of progression.  He had a bone marrow biopsy as well which showed mild increase in his marrow plasmacytosis at 20% which did not appear significantly different from his previous marrow.  It is therefore difficult to attribute his anemia to his smoldering multiple myeloma.  2.  MDS: Patient is currently on Luspatercept I am restarting his Luspatercept every 3 weeks.  I am concerned that his anemia is possibly secondary to his chronic kidney disease as there was no worsening of his MDS noted on his bone Marrow either.  We could potentially consider restarting Retacrit and see if he has response.  I will reach out to Dr. [name]  about this as well.  I will see him back in 2 months with labs    Visit Diagnosis 1. Smoldering multiple myeloma   2. 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 Lump in neck      Dr. Owens Shark, MD, MPH [facility] [phone] 01/05/2023 8:57 PM

## 2023-01-08 ENCOUNTER — Inpatient Hospital Stay: Payer: PPO

## 2023-01-09 ENCOUNTER — Inpatient Hospital Stay: Payer: PPO

## 2023-01-09 VITALS — BP 163/76

## 2023-01-09 DIAGNOSIS — Z5111 Encounter for antineoplastic chemotherapy: Secondary | ICD-10-CM | POA: Diagnosis not present

## 2023-01-09 DIAGNOSIS — D46Z Other myelodysplastic syndromes: Secondary | ICD-10-CM

## 2023-01-09 LAB — CBC WITH DIFFERENTIAL/PLATELET
Abs Immature Granulocytes: 0.01 10*3/uL (ref 0.00–0.07)
Basophils Absolute: 0 10*3/uL (ref 0.0–0.1)
Basophils Relative: 0 %
Eosinophils Absolute: 0.1 10*3/uL (ref 0.0–0.5)
Eosinophils Relative: 2 %
HCT: 25.4 % — ABNORMAL LOW (ref 39.0–52.0)
Hemoglobin: 8 g/dL — ABNORMAL LOW (ref 13.0–17.0)
Immature Granulocytes: 0 %
Lymphocytes Relative: 27 %
Lymphs Abs: 1 10*3/uL (ref 0.7–4.0)
MCH: 32.4 pg (ref 26.0–34.0)
MCHC: 31.5 g/dL (ref 30.0–36.0)
MCV: 102.8 fL — ABNORMAL HIGH (ref 80.0–100.0)
Monocytes Absolute: 0.2 10*3/uL (ref 0.1–1.0)
Monocytes Relative: 6 %
Neutro Abs: 2.3 10*3/uL (ref 1.7–7.7)
Neutrophils Relative %: 65 %
Platelets: 115 10*3/uL — ABNORMAL LOW (ref 150–400)
RBC: 2.47 MIL/uL — ABNORMAL LOW (ref 4.22–5.81)
RDW: 17.8 % — ABNORMAL HIGH (ref 11.5–15.5)
WBC: 3.5 10*3/uL — ABNORMAL LOW (ref 4.0–10.5)
nRBC: 0 % (ref 0.0–0.2)

## 2023-01-09 MED ORDER — LUSPATERCEPT-AAMT 75 MG ~~LOC~~ SOLR
100.0000 mg | SUBCUTANEOUS | Status: AC
  Administered 2023-01-09: 100 mg via SUBCUTANEOUS
  Filled 2023-01-09: qty 0.5

## 2023-01-09 NOTE — Progress Notes (Signed)
Per Dr Smith Robert give rebloyzl without CBC.  Demetrius Charity, PharmD

## 2023-01-29 ENCOUNTER — Inpatient Hospital Stay: Payer: PPO | Attending: Nurse Practitioner

## 2023-01-29 VITALS — BP 154/68 | HR 82

## 2023-01-29 DIAGNOSIS — D46Z Other myelodysplastic syndromes: Secondary | ICD-10-CM | POA: Diagnosis present

## 2023-01-29 DIAGNOSIS — Z5111 Encounter for antineoplastic chemotherapy: Secondary | ICD-10-CM | POA: Diagnosis present

## 2023-01-29 MED ORDER — LUSPATERCEPT-AAMT 75 MG ~~LOC~~ SOLR
100.0000 mg | SUBCUTANEOUS | Status: DC
  Administered 2023-01-29: 100 mg via SUBCUTANEOUS
  Filled 2023-01-29: qty 0.5

## 2023-02-06 ENCOUNTER — Other Ambulatory Visit: Payer: Self-pay | Admitting: Otolaryngology

## 2023-02-06 DIAGNOSIS — R221 Localized swelling, mass and lump, neck: Secondary | ICD-10-CM

## 2023-02-13 ENCOUNTER — Ambulatory Visit
Admission: RE | Admit: 2023-02-13 | Discharge: 2023-02-13 | Disposition: A | Discharge status: Home / Self Care | Payer: PPO | Source: Ambulatory Visit | Attending: Otolaryngology | Admitting: Otolaryngology

## 2023-02-13 ENCOUNTER — Other Ambulatory Visit: Payer: PPO

## 2023-02-13 DIAGNOSIS — R221 Localized swelling, mass and lump, neck: Secondary | ICD-10-CM

## 2023-02-13 MED ORDER — IOPAMIDOL (ISOVUE-300) INJECTION 61%
60.0000 mL | Freq: Once | INTRAVENOUS | Status: AC | PRN
  Administered 2023-02-13: 60 mL via INTRAVENOUS

## 2023-02-19 ENCOUNTER — Inpatient Hospital Stay: Payer: PPO

## 2023-02-21 ENCOUNTER — Inpatient Hospital Stay: Payer: PPO

## 2023-02-21 ENCOUNTER — Inpatient Hospital Stay (HOSPITAL_BASED_OUTPATIENT_CLINIC_OR_DEPARTMENT_OTHER): Payer: PPO | Admitting: Medical Oncology

## 2023-02-21 ENCOUNTER — Encounter: Payer: Self-pay | Admitting: Medical Oncology

## 2023-02-21 ENCOUNTER — Inpatient Hospital Stay: Payer: PPO | Attending: Nurse Practitioner

## 2023-02-21 ENCOUNTER — Telehealth: Payer: Self-pay | Admitting: *Deleted

## 2023-02-21 ENCOUNTER — Other Ambulatory Visit: Payer: Self-pay | Admitting: *Deleted

## 2023-02-21 VITALS — BP 142/72 | HR 83 | Temp 97.6°F | Wt 196.0 lb

## 2023-02-21 DIAGNOSIS — D46Z Other myelodysplastic syndromes: Secondary | ICD-10-CM

## 2023-02-21 DIAGNOSIS — E538 Deficiency of other specified B group vitamins: Secondary | ICD-10-CM

## 2023-02-21 DIAGNOSIS — N1831 Chronic kidney disease, stage 3a: Secondary | ICD-10-CM

## 2023-02-21 DIAGNOSIS — D472 Monoclonal gammopathy: Secondary | ICD-10-CM

## 2023-02-21 DIAGNOSIS — Z5111 Encounter for antineoplastic chemotherapy: Secondary | ICD-10-CM | POA: Diagnosis present

## 2023-02-21 DIAGNOSIS — D469 Myelodysplastic syndrome, unspecified: Secondary | ICD-10-CM | POA: Diagnosis not present

## 2023-02-21 LAB — CBC
HCT: 27.4 % — ABNORMAL LOW (ref 39.0–52.0)
Hemoglobin: 8.7 g/dL — ABNORMAL LOW (ref 13.0–17.0)
MCH: 32.8 pg (ref 26.0–34.0)
MCHC: 31.8 g/dL (ref 30.0–36.0)
MCV: 103.4 fL — ABNORMAL HIGH (ref 80.0–100.0)
Platelets: 106 10*3/uL — ABNORMAL LOW (ref 150–400)
RBC: 2.65 MIL/uL — ABNORMAL LOW (ref 4.22–5.81)
RDW: 16.4 % — ABNORMAL HIGH (ref 11.5–15.5)
WBC: 3.7 10*3/uL — ABNORMAL LOW (ref 4.0–10.5)
nRBC: 0 % (ref 0.0–0.2)

## 2023-02-21 LAB — IRON AND TIBC
Iron: 102 ug/dL (ref 45–182)
Saturation Ratios: 34 % (ref 17.9–39.5)
TIBC: 298 ug/dL (ref 250–450)
UIBC: 196 ug/dL

## 2023-02-21 LAB — FERRITIN: Ferritin: 134 ng/mL (ref 24–336)

## 2023-02-21 MED ORDER — LUSPATERCEPT-AAMT 75 MG ~~LOC~~ SOLR
100.0000 mg | SUBCUTANEOUS | Status: DC
  Administered 2023-02-21: 100 mg via SUBCUTANEOUS
  Filled 2023-02-21: qty 1.5

## 2023-02-21 NOTE — Progress Notes (Signed)
Hematology/Oncology Consult note [facility]  [phone] [phone]  Patient Care Team: [facility] as PCP - General (Geriatric Medicine) Rozetta Nunnery, MD as Referring Physician (Hematology and Oncology) Rozetta Nunnery, MD as Referring Physician (Hematology and Oncology)   Name of the patient: Alex Smith  [id number]  [birth year]   Date of visit: 02/21/23  Diagnosis- History of hairy cell leukemia 2.  Smoldering multiple myeloma under observation 3.  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 [name]. He has following issues:   1.  Hairy cell leukemia that was initially diagnosed on bone marrow biopsy in June 2016.  Bone marrow biopsy at that time showed extensive marrow involvement with recurrent hairy cell leukemia approximately 80% of the cells in the core 10% monoclonal plasma cell infiltrate compatible with plasma cell neoplasm.  Peripheral smear revealed leukopenia with a white count of 1900 compatible with hairy cell leukemia. 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  FISH studies for MDS were negative. Cytogenetics were normal (46, XY).  He has last received cladribine for this back in July 2016.   2.  Smoldering multiple myeloma: Bone marrow biopsy in March 2017 revealed persistent plasma cell neoplasm with monoclonal plasma cells estimated at 20 to 30%.  No morphologic evidence of residual hairy cell leukemia.Marrow was normocellular for age with relative erythroid hyperplasia, relative myeloid hypoplasia, mild dyspoiesis, adequate megakaryocytes and no increased blasts.  There was no significant increase in marrow reticulin fibers.  Iron was present.  FISH studies for MDS were negative.  FISH panel for myeloma revealed  CCND1/IGH translocation-  t(11;14) and loss of MAF/16q.  Cytogenetics were normal (46, XY).  Last bone marrow biopsy was in October 2018 which showed variably cellular marrow 10 to 50% with kappa restricted plasmacytosis of 10 to 15%.  No evidence of leukemia lymphoma or high-grade dysplasia.  Normal cytogenetics.  He has also been followed by Dr. [name] at [facility].  He has received Revlimid and Decadron in the past in 2017 which was subsequently stopped.  PET scan in July 2017 showed no hypermetabolic adenopathy or focal skeletal lesions.    3.  MDS: He has received a trial of Procrit in the past in 2017 but apparently did not respond to it.  He has been getting periodic blood transfusions to maintain his hemoglobin closer to 9 as he is symptomatic when his hemoglobin is less than 9. He has been getting about 2-3 blood transfusions during the year.  He does have some mild leukopenia/neutropenia and is on acyclovir prophylaxis.  He was getting weekly G-CSF when his ANC is less than 1.5.  He is not presently on G-CSF.  He is currently on Luspatercept for his anemia.    4.  Also has history of B12 deficiency for which he is on oral B12. MCV is 103.4     Interval history-He has recovered well from his recent hospitalization.  He has chronic fatigue.  Reports right neck swelling  ECOG PS- 2 Pain scale- 0  Review of systems- Review of Systems  Constitutional:  Positive for malaise/fatigue. Negative for chills, fever and weight loss.  HENT:  Negative for congestion, ear discharge and nosebleeds.   Eyes:  Negative for blurred vision.  Respiratory:  Negative for cough, hemoptysis, sputum production,  shortness of breath and wheezing.   Cardiovascular:  Negative for chest pain, palpitations, orthopnea and claudication.  Gastrointestinal:  Negative for abdominal pain, blood in stool, constipation, diarrhea, heartburn, melena, nausea and vomiting.  Genitourinary:  Negative for dysuria, flank pain,  frequency, hematuria and urgency.  Musculoskeletal:  Negative for back pain, joint pain and myalgias.  Skin:  Negative for rash.  Neurological:  Negative for dizziness, tingling, focal weakness, seizures, weakness and headaches.  Endo/Heme/Allergies:  Does not bruise/bleed easily.  Psychiatric/Behavioral:  Negative for depression and suicidal ideas. The patient does not have insomnia.       Allergies  Allergen Reactions   Rituximab Rash    Chest tightness Chest tightness   Blood-Group Specific Substance Other (See Comments)    Had a post transfusion reaction of red blood cells; NOW REQUIRES WASHED BLOOD CELLS Had a post transfusion reaction of red blood cells; NOW REQUIRES WASHED BLOOD CELLS   Primaxin [Imipenem] Other (See Comments)    Possible allergy Tolerates cephalosporins   Voriconazole Other (See Comments)   Sulfa Antibiotics Itching and Rash   Sulfacetamide Sodium Itching and Rash     Past Medical History:  Diagnosis Date   Alzheimer's dementia (HCC) 08/18/2021   Anemia    B12 deficiency    Basal cell carcinoma 03/30/2018   left ant nasal ala   Basal cell carcinoma of face 01/19/2013   right distal dorsum nose   BPH (benign prostatic hypertrophy)    followed by urology, discharged (Dr. 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 pretibial. 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 EDC 09/07/2020   Squamous cell  carcinoma of skin 04/03/2020   Left cheek. WD SCC   Systolic murmur 05/28/2016   Type 2 diabetes, controlled, with retinopathy (HCC)      Past Surgical History:  Procedure Laterality Date   25 GAUGE PARS PLANA VITRECTOMY WITH 20 GAUGE MVR PORT FOR MACULAR HOLE  07/07/2012   Procedure: 25 GAUGE PARS PLANA VITRECTOMY WITH 20 GAUGE MVR PORT FOR MACULAR HOLE;  Surgeon: Sherrie George, MD;  Location: [facility] OR;  Service: Ophthalmology;  Laterality: Left;   BONE MARROW BIOPSY  2016   CARDIOVASCULAR STRESS TEST  2013   treadmill - no evidence ischemia, EF 61%   CATARACT EXTRACTION W/ INTRAOCULAR LENS  IMPLANT, BILATERAL  ~ 2010   COLONOSCOPY  2014   Elliot WNL no rpt needed, h/o polyps   EYE SURGERY Left 06/2012   laser surgery   GAS INSERTION  07/07/2012   Procedure: INSERTION OF GAS;  Surgeon: Sherrie George, MD;  Location: [facility] OR;  Service: Ophthalmology;  Laterality: Left;  C3F8   PERIPHERAL VASCULAR CATHETERIZATION N/A 02/23/2015   Procedure: Shelda Pal Cath Insertion;  Surgeon: Annice Needy, MD;  Location: [facility] INVASIVE CV LAB;  Service: Cardiovascular;  Laterality: N/A;  PORTA CATH REMOVAL N/A 02/16/2020   Procedure: PORTA CATH REMOVAL;  Surgeon: Annice Needy, MD;  Location: [facility] INVASIVE CV LAB;  Service: Cardiovascular;  Laterality: N/A;   SERUM PATCH  07/07/2012   Procedure: SERUM PATCH;  Surgeon: Sherrie George, MD;  Location: [facility] OR;  Service: Ophthalmology;  Laterality: Left;   SKIN CANCER EXCISION     "all over my face" (07/07/2012)    Social History   Socioeconomic History   Marital status: Married    Spouse name: Not on file   Number of children: Not on file   Years of education: Not on file   Highest education level: Not on file  Occupational History   Not on file  Tobacco Use   Smoking status: Former    Packs/day: 2.00    Years: 25.00    Additional pack years: 0.00    Total pack years: 50.00    Types: Cigarettes    Quit date: 02/06/1969    Years since quitting: 54.0    Smokeless tobacco: Never   Tobacco comments:    07/07/2012 "stopped smoking ~ 40 yr ago; smoked 20-7yr"  Vaping Use   Vaping Use: Never used  Substance and Sexual Activity   Alcohol use: Not Currently   Drug use: No   Sexual activity: Never  Other Topics Concern   Not on file  Social History Narrative   Lives at home alone. 1 cat   Occupation: was Tree surgeon, works part time for [employer]   Activity: likes to golf   Diet: moderate water, fruits/vegetables daily   Social Determinants of Corporate investment banker Strain: Not on file  Food Insecurity: Not on file  Transportation Needs: Not on file  Physical Activity: Not on file  Stress: Not on file  Social Connections: Not on file  Intimate Partner Violence: Not on file    Family History  Problem Relation Age of Onset   Dementia Mother    Heart failure Father 26   Cancer Sister        breast   Diabetes Paternal Uncle    Diabetes Paternal Aunt    CAD Brother 60       MI   Stroke Neg Hx      Current Outpatient Medications:    acetaminophen (TYLENOL) 500 MG tablet, Take 2 tablets (1,000 mg total) by mouth 2 (two) times daily., Disp: 120 tablet, Rfl: 0   albuterol (VENTOLIN HFA) 108 (90 Base) MCG/ACT inhaler, Inhale 2 puffs into the lungs every 6 (six) hours as needed for wheezing or shortness of breath., Disp: , Rfl:    alum & mag hydroxide-simeth (MAALOX/MYLANTA) 200-200-20 MG/5ML suspension, Take 30 mLs by mouth every 4 (four) hours as needed for indigestion or heartburn., Disp: 355 mL, Rfl: 0   amLODipine (NORVASC) 10 MG tablet, Take 1 tablet (10 mg total) by mouth daily., Disp: , Rfl:    ARICEPT 5 MG tablet, Take 5 mg by mouth daily., Disp: , Rfl:    aspirin EC 81 MG tablet, Take 81 mg by mouth daily., Disp: , Rfl:    insulin glargine (LANTUS) 100 UNIT/ML Solostar Pen, Inject 6 Units into the skin at bedtime., Disp: 15 mL, Rfl: 11   insulin lispro (HUMALOG) 100 UNIT/ML KwikPen, Inject 2 Units into the skin 3  (three) times daily before meals. Per sliding scale, Disp: 15 mL, Rfl: 11   losartan (COZAAR) 25 MG tablet, Take 1 tablet (25 mg total) by mouth daily.,  Disp: 30 tablet, Rfl: 0   melatonin 5 MG TABS, Take 5 mg by mouth at bedtime., Disp: , Rfl:    Multiple Vitamin (MULTIVITAMIN WITH MINERALS) TABS tablet, Take 1 tablet by mouth daily., Disp: , Rfl:    pantoprazole (PROTONIX) 40 MG tablet, Take 1 tablet (40 mg total) by mouth 2 (two) times daily., Disp: , Rfl:    QUEtiapine (SEROQUEL) 25 MG tablet, Take 0.5 tablets (12.5 mg total) by mouth at bedtime as needed (insomnia, agitation)., Disp: 15 tablet, Rfl: 0   sertraline (ZOLOFT) 25 MG tablet, Take 25 mg by mouth daily., Disp: , Rfl:    tamsulosin (FLOMAX) 0.4 MG CAPS capsule, TAKE 1 CAPSULE BY MOUTH EVERY DAY (Patient taking differently: Take 0.4 mg by mouth daily.), Disp: 30 capsule, Rfl: 3   vitamin B-12 (CYANOCOBALAMIN) 1000 MCG tablet, Take 1 tablet (1,000 mcg total) by mouth daily., Disp: 90 tablet, Rfl: 3   vitamin E 400 UNIT capsule, Take 400 Units by mouth daily., Disp: , Rfl:  No current facility-administered medications for this visit.  Facility-Administered Medications Ordered in Other Visits:    heparin lock flush 100 unit/mL, 500 Units, Intravenous, Once, Corcoran, Melissa C, MD   luspatercept-aamt (REBLOZYL) subcutaneous injection 100 mg, 100 mg, Subcutaneous, Q21 days, Creig Hines, MD, 100 mg at 08/27/22 1206   luspatercept-aamt (REBLOZYL) subcutaneous injection 100 mg, 100 mg, Subcutaneous, Q21 days, Creig Hines, MD, 100 mg at 01/09/23 1428   luspatercept-aamt (REBLOZYL) subcutaneous injection 100 mg, 100 mg, Subcutaneous, Q21 days, Creig Hines, MD, 100 mg at 02/21/23 1147   sodium chloride 0.9 % injection 10 mL, 10 mL, Intravenous, PRN, [name], Melissa C, MD, 10 mL at 03/03/15 0903   sodium chloride 0.9 % injection 10 mL, 10 mL, Intracatheter, PRN, [name], Melissa C, MD, 10 mL at 03/10/15 1410   sodium chloride flush  (NS) 0.9 % injection 10 mL, 10 mL, Intravenous, PRN, [name], Melissa C, MD, 10 mL at 07/23/18 1517  Physical exam:  Vitals:   02/21/23 1020  BP: (!) 142/72  Pulse: 83  Temp: 97.6 F (36.4 C)  TempSrc: Tympanic  SpO2: 97%  Weight: 196 lb (88.9 kg)   Physical Exam Cardiovascular:     Rate and Rhythm: Normal rate and regular rhythm.     Heart sounds: Normal heart sounds.  Pulmonary:     Effort: Pulmonary effort is normal.     Breath sounds: Normal breath sounds.  Abdominal:     General: Bowel sounds are normal.     Palpations: Abdomen is soft.  Musculoskeletal:     Cervical back: Normal range of motion.  Lymphadenopathy:     Comments: Firm swelling noted in the submandibular area which appears to be an enlarged submandibular gland  Skin:    General: Skin is warm and dry.  Neurological:     Mental Status: He is alert and oriented to person, place, and time.         Latest Ref Rng & Units 12/18/2022    9:24 AM  CMP  Glucose 70 - 99 mg/dL 161   BUN 8 - 23 mg/dL 29   Creatinine 0.96 - 1.24 mg/dL 0.45   Sodium 409 - 811 mmol/L 132   Potassium 3.5 - 5.1 mmol/L 3.9   Chloride 98 - 111 mmol/L 104   CO2 22 - 32 mmol/L 21   Calcium 8.9 - 10.3 mg/dL 8.3   Total Protein 6.5 - 8.1 g/dL 7.5   Total Bilirubin 0.3 -  1.2 mg/dL 0.5   Alkaline Phos 38 - 126 U/L 73   AST 15 - 41 U/L 17   ALT 0 - 44 U/L 9       Latest Ref Rng & Units 02/21/2023   10:05 AM  CBC  WBC 4.0 - 10.5 K/uL 3.7   Hemoglobin 13.0 - 17.0 g/dL 8.7   Hematocrit 60.4 - 52.0 % 27.4   Platelets 150 - 400 K/uL 106     No images are attached to the encounter.  CT SOFT TISSUE NECK W CONTRAST  Result Date: 02/20/2023 CLINICAL DATA:  Right-sided neck mass below the jaw line for 6 months. Abnormal soft tissue ultrasound. EXAM: CT NECK WITH CONTRAST TECHNIQUE: Multidetector CT imaging of the neck was performed using the standard protocol following the bolus administration of intravenous contrast. RADIATION DOSE  REDUCTION: This exam was performed according to the departmental dose-optimization program which includes automated exposure control, adjustment of the mA and/or kV according to patient size and/or use of iterative reconstruction technique. CONTRAST:  60mL ISOVUE-300 IOPAMIDOL (ISOVUE-300) INJECTION 61% COMPARISON:  Soft tissue neck ultrasound 12/26/2022 FINDINGS: Pharynx and larynx: No significant mucosal or submucosal lesions are present. The nasopharynx is clear. The soft palate and tongue base are within normal limits. There is some stranding in the right parapharyngeal fat. Vallecula and epiglottis are within normal limits. Aryepiglottic folds and piriform sinuses are clear. Vocal cords are midline and symmetric. Trachea is clear. Salivary glands: The right submandibular gland is enlarged compared to the left. Tissue stranding is associated. The gland is hyperdense or hyperenhancing compared to the left. No discrete lesion is present. An obstructing 8 mm stone is present in the proximal right submandibular duct. A 2.5 mm stone is present just proximal to the larger stone. More distal submandibular duct is clear. The left submandibular gland and duct are within normal limits. The parotid glands are within normal limits bilaterally. Thyroid: Normal Lymph nodes: No significant adenopathy is present. Vascular: Atherosclerotic calcifications are present carotid bifurcations bilaterally. Asymmetric proximal right ICA stenosis is present. Atherosclerotic calcifications are present in the aortic arch and great vessel origins without significant stenosis or aneurysm. Limited intracranial: Insert normal brain Visualized orbits: Bilateral lens replacements are noted. Globes and orbits are otherwise unremarkable. Mastoids and visualized paranasal sinuses: Chronic mucosal thickening is present in the right maxillary sinus. Minimal ethmoid opacification is present. The paranasal sinuses and mastoid air cells are otherwise  clear. Skeleton: Mild degenerative changes are present in the cervical spine. Slight degenerative anterolisthesis is present at C4-5. Alignment is otherwise anatomic. No focal osseous lesions are present. A remote superior endplate T3 fracture is present. No acute fractures are present. Upper chest: The lung apices are clear. The thoracic inlet is within normal limits. IMPRESSION: 1. Obstructing 8 mm stone in the proximal right submandibular duct. 2. A 2.5 mm stone is present just proximal to the larger stone. 3. Right submandibular gland is enlarged compared to the left with associated inflammatory changes compatible with acute/subacute sialoadenitis secondary to obstruction. 4. No significant adenopathy. 5. Remote superior endplate T3 fracture. 6. Mild degenerative changes in the cervical spine. 7.  Aortic Atherosclerosis (ICD10-I70.0). Electronically Signed   By: Marin Roberts M.D.   On: 02/20/2023 17:40     Assessment and plan- Patient is a 88 y.o. male who is here for follow-up of following issues:  Smoldering multiple myeloma: Labs so far including myeloma panel and serum. Free light chains have remained stable and not indicated of progression.  Current labs pending.  He had a bone marrow biopsy as well which showed mild increase in his marrow plasmacytosis at 20% which did not appear significantly different from his previous marrow.  It is therefore difficult to attribute his anemia to his smoldering multiple myeloma.  2.  MDS: Patient is currently on Luspatercept every 3 weeks.  Dr. Smith Robert is concerned that his anemia is possibly secondary to his chronic kidney disease as there was no worsening of his MDS noted on his bone marrow biopsy. Today his WBC is 3.7 which is up a bit from 3.5. Hgb today is 8.7 which is up a bit from 8.0. Platelets are 106 which is down slightly from 115. He will receive his Luspatercept today.   Disposition Luspatercept today RTC labs (CBC, CMP) +-Luspatercept every 3  weeks RTC in 2 months with MD, labs (CBC w/, iron, ferritin, MM panel, light chains)-[place]     Visit Diagnosis 1. MDS (myelodysplastic syndrome) (HCC)   2. B12 deficiency   3. Stage 3a chronic kidney disease (CKD) (HCC)      Jannetta Quint [facility] [phone] 02/21/2023 12:55 PM

## 2023-02-21 NOTE — Telephone Encounter (Signed)
I called the son of the pt. About the hgb dropping and dr Smith Robert has reached out to Dr. Anise Salvo and not heard from her. So dr Smith Robert wanted me to send 2nd opinion about anemia is worseing and wanted to see if adding retacrit or something else and the luspatercept is back to every3 weeks. Son has been informed and if he hears something or Smith Robert gets info we will talk to each other.

## 2023-02-24 LAB — KAPPA/LAMBDA LIGHT CHAINS
Kappa free light chain: 59.4 mg/L — ABNORMAL HIGH (ref 3.3–19.4)
Kappa, lambda light chain ratio: 3.6 — ABNORMAL HIGH (ref 0.26–1.65)
Lambda free light chains: 16.5 mg/L (ref 5.7–26.3)

## 2023-02-25 DIAGNOSIS — B029 Zoster without complications: Secondary | ICD-10-CM | POA: Insufficient documentation

## 2023-02-26 LAB — MULTIPLE MYELOMA PANEL, SERUM
Albumin SerPl Elph-Mcnc: 3.4 g/dL (ref 2.9–4.4)
Albumin/Glob SerPl: 1.1 (ref 0.7–1.7)
Alpha 1: 0.3 g/dL (ref 0.0–0.4)
Alpha2 Glob SerPl Elph-Mcnc: 0.7 g/dL (ref 0.4–1.0)
B-Globulin SerPl Elph-Mcnc: 0.7 g/dL (ref 0.7–1.3)
Gamma Glob SerPl Elph-Mcnc: 1.7 g/dL (ref 0.4–1.8)
Globulin, Total: 3.3 g/dL (ref 2.2–3.9)
IgA: 19 mg/dL — ABNORMAL LOW (ref 61–437)
IgG (Immunoglobin G), Serum: 1889 mg/dL — ABNORMAL HIGH (ref 603–1613)
IgM (Immunoglobulin M), Srm: 29 mg/dL (ref 15–143)
M Protein SerPl Elph-Mcnc: 1.4 g/dL — ABNORMAL HIGH
Total Protein ELP: 6.7 g/dL (ref 6.0–8.5)

## 2023-02-27 ENCOUNTER — Ambulatory Visit (INDEPENDENT_AMBULATORY_CARE_PROVIDER_SITE_OTHER): Payer: PPO | Admitting: Podiatry

## 2023-02-27 ENCOUNTER — Encounter: Payer: Self-pay | Admitting: Podiatry

## 2023-02-27 VITALS — BP 172/77 | HR 75

## 2023-02-27 DIAGNOSIS — E1122 Type 2 diabetes mellitus with diabetic chronic kidney disease: Secondary | ICD-10-CM

## 2023-02-27 DIAGNOSIS — M79675 Pain in left toe(s): Secondary | ICD-10-CM

## 2023-02-27 DIAGNOSIS — L84 Corns and callosities: Secondary | ICD-10-CM

## 2023-02-27 DIAGNOSIS — B351 Tinea unguium: Secondary | ICD-10-CM | POA: Diagnosis not present

## 2023-02-27 DIAGNOSIS — M79674 Pain in right toe(s): Secondary | ICD-10-CM | POA: Diagnosis not present

## 2023-02-27 DIAGNOSIS — R600 Localized edema: Secondary | ICD-10-CM

## 2023-02-27 DIAGNOSIS — N183 Chronic kidney disease, stage 3 unspecified: Secondary | ICD-10-CM | POA: Diagnosis not present

## 2023-02-27 DIAGNOSIS — M205X2 Other deformities of toe(s) (acquired), left foot: Secondary | ICD-10-CM

## 2023-02-27 DIAGNOSIS — Z794 Long term (current) use of insulin: Secondary | ICD-10-CM

## 2023-02-27 DIAGNOSIS — E119 Type 2 diabetes mellitus without complications: Secondary | ICD-10-CM

## 2023-03-02 ENCOUNTER — Encounter: Payer: Self-pay | Admitting: Podiatry

## 2023-03-02 NOTE — Progress Notes (Signed)
ANNUAL DIABETIC FOOT EXAM  Subjective: Alex Smith presents today annual diabetic foot exam. He is a resident of [address] Patient is accompanied by his son on today's visit. Chief Complaint  Patient presents with   Nail Problem    "Toenail trim" Dr. Smiley Houseman - saw her on Tuesday, yesterday glucose pm - 364 mg/dl,    Patient confirms h/o diabetes.  Patient denies any h/o foot wounds.  Patient denies any numbness, tingling, burning, or pins/needle sensation in feet.  Risk factors: diabetes, chronic lower extremity edema, HTN, CKD, dyslipidemia.  [facility] is patient's PCP.  Past Medical History:  Diagnosis Date   Alzheimer's dementia (HCC) 08/18/2021   Anemia    B12 deficiency    Basal cell carcinoma 03/30/2018   left ant nasal ala   Basal cell carcinoma of face 01/19/2013   right distal dorsum nose   BPH (benign prostatic hypertrophy)    followed by urology, discharged (Dr. 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 pretibial. 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 EDC 09/07/2020   Squamous cell carcinoma of skin 04/03/2020   Left cheek. WD SCC   Systolic murmur 05/28/2016   Type 2 diabetes, controlled, with retinopathy (HCC)    Patient Active Problem List   Diagnosis Date Noted   Edema of lower extremity 12/25/2022   Chronic kidney disease due to hypertension 12/25/2022    Normocytic anemia 11/27/2022   Pancytopenia (HCC) 11/21/2022   Metabolic acidosis 11/21/2022   Overweight (BMI 25.0-29.9) 11/20/2022   Acute renal failure superimposed on stage 3a chronic kidney disease (HCC) 11/19/2022   Acute respiratory failure with hypoxia (HCC) 04/16/2022   Generalized weakness 04/16/2022   Chronic kidney disease, stage 3b (HCC) 04/16/2022   COVID-19 04/10/2022   Hypercholesterolemia 11/12/2021   Hyperglycemia due to type 2 diabetes mellitus (HCC) 11/12/2021   Closed compression fracture of L2 lumbar vertebra, initial encounter (HCC) 08/18/2021   Alzheimer's dementia (HCC) 08/18/2021   Acute lower UTI leading to weakness and dehydration 08/18/2021   Laceration of head 08/18/2021   Hypoglycemia 06/29/2021   Severe sepsis (HCC) 05/04/2021   CAP (community acquired pneumonia) 05/02/2021   Vitamin D deficiency 03/10/2019   Depressed mood 03/10/2019   Stage 3a chronic kidney disease (CKD) (HCC) 11/14/2017   Goals of care, counseling/discussion 05/27/2017   Pancytopenia due to antineoplastic chemotherapy (HCC) 11/17/2016   Chemotherapy-induced neutropenia (HCC) 09/22/2016   Urinary incontinence 08/02/2016   Systolic murmur 05/28/2016   Myelodysplastic syndrome, low grade (HCC) 02/05/2016   Multiple myeloma not having achieved remission (HCC) 02/05/2016   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   Myelodysplasia present in bone marrow (HCC) 11/03/2015   Advanced care planning/counseling discussion 05/22/2015   Insomnia 03/03/2015   Monoclonal gammopathy 02/03/2015   Macrocytic anemia 02/03/2015   Obesity (BMI 30-39.9) 10/12/2014   History of  nonmelanoma skin cancer 10/10/2014   Medicare annual wellness visit, subsequent 04/12/2014   Health maintenance examination 04/12/2014   Hairy cell leukemia, in remission (HCC)    B12 deficiency    BPH (benign prostatic hyperplasia)    Essential hypertension    GERD (gastroesophageal reflux disease)    Dyslipidemia    Type 2  diabetes mellitus with stage 3 chronic kidney disease (HCC)    Diverticulosis    Macular hole 07/07/2012   Past Surgical History:  Procedure Laterality Date   25 GAUGE PARS PLANA VITRECTOMY WITH 20 GAUGE MVR PORT FOR MACULAR HOLE  07/07/2012   Procedure: 25 GAUGE PARS PLANA VITRECTOMY WITH 20 GAUGE MVR PORT FOR MACULAR HOLE;  Surgeon: Sherrie George, MD;  Location: [facility] OR;  Service: Ophthalmology;  Laterality: Left;   BONE MARROW BIOPSY  2016   CARDIOVASCULAR STRESS TEST  2013   treadmill - no evidence ischemia, EF 61%   CATARACT EXTRACTION W/ INTRAOCULAR LENS  IMPLANT, BILATERAL  ~ 2010   COLONOSCOPY  2014   Elliot WNL no rpt needed, h/o polyps   EYE SURGERY Left 06/2012   laser surgery   GAS INSERTION  07/07/2012   Procedure: INSERTION OF GAS;  Surgeon: Sherrie George, MD;  Location: [facility] OR;  Service: Ophthalmology;  Laterality: Left;  [id number]   PERIPHERAL VASCULAR CATHETERIZATION N/A 02/23/2015   Procedure: Shelda Pal Cath Insertion;  Surgeon: Annice Needy, MD;  Location: [facility] INVASIVE CV LAB;  Service: Cardiovascular;  Laterality: N/A;   PORTA CATH REMOVAL N/A 02/16/2020   Procedure: PORTA CATH REMOVAL;  Surgeon: Annice Needy, MD;  Location: [facility] INVASIVE CV LAB;  Service: Cardiovascular;  Laterality: N/A;   SERUM PATCH  07/07/2012   Procedure: SERUM PATCH;  Surgeon: Sherrie George, MD;  Location: [facility] OR;  Service: Ophthalmology;  Laterality: Left;   SKIN CANCER EXCISION     "all over my face" (07/07/2012)   Current Outpatient Medications on File Prior to Visit  Medication Sig Dispense Refill   acetaminophen (TYLENOL) 500 MG tablet Take 2 tablets (1,000 mg total) by mouth 2 (two) times daily. 120 tablet 0   albuterol (VENTOLIN HFA) 108 (90 Base) MCG/ACT inhaler Inhale 2 puffs into the lungs every 6 (six) hours as needed for wheezing or shortness of breath.     amLODipine (NORVASC) 10 MG tablet Take 1 tablet (10 mg total) by mouth daily.     aspirin EC 81 MG tablet Take 81 mg by mouth daily.      clindamycin (CLEOCIN) 300 MG capsule Take 300 mg by mouth 4 (four) times daily.     famotidine (PEPCID) 20 MG tablet Take 20 mg by mouth daily.     insulin glargine (LANTUS) 100 UNIT/ML Solostar Pen Inject 6 Units into the skin at bedtime. 15 mL 11   insulin lispro (HUMALOG) 100 UNIT/ML KwikPen Inject 2 Units into the skin 3 (three) times daily before meals. Per sliding scale 15 mL 11   loperamide (IMODIUM A-D) 2 MG tablet Take 2 mg by mouth 4 (four) times daily as needed for diarrhea or loose stools.     losartan (COZAAR) 25 MG tablet Take 1 tablet (25 mg total) by mouth daily. 30 tablet 0   magnesium hydroxide (MILK OF MAGNESIA) 400 MG/5ML suspension Take 5 mLs by mouth daily as needed for mild constipation.     Multiple Vitamin (MULTIVITAMIN WITH MINERALS) TABS tablet Take 1 tablet by mouth daily.     pantoprazole (PROTONIX) 40  MG tablet Take 1 tablet (40 mg total) by mouth 2 (two) times daily.     pravastatin (PRAVACHOL) 20 MG tablet Take 20 mg by mouth daily.     QUEtiapine (SEROQUEL) 25 MG tablet Take 0.5 tablets (12.5 mg total) by mouth at bedtime as needed (insomnia, agitation). 15 tablet 0   sertraline (ZOLOFT) 25 MG tablet Take 25 mg by mouth daily.     tamsulosin (FLOMAX) 0.4 MG CAPS capsule TAKE 1 CAPSULE BY MOUTH EVERY DAY 30 capsule 3   torsemide (DEMADEX) 20 MG tablet Take 20 mg by mouth daily.     vitamin B-12 (CYANOCOBALAMIN) 1000 MCG tablet Take 1 tablet (1,000 mcg total) by mouth daily. 90 tablet 3   vitamin E 400 UNIT capsule Take 400 Units by mouth daily.     alum & mag hydroxide-simeth (MAALOX/MYLANTA) 200-200-20 MG/5ML suspension Take 30 mLs by mouth every 4 (four) hours as needed for indigestion or heartburn. (Patient not taking: Reported on 02/27/2023) 355 mL 0   ARICEPT 5 MG tablet Take 5 mg by mouth daily. (Patient not taking: Reported on 02/27/2023)     melatonin 5 MG TABS Take 5 mg by mouth at bedtime. (Patient not taking: Reported on 02/27/2023)     Current  Facility-Administered Medications on File Prior to Visit  Medication Dose Route Frequency Provider Last Rate Last Admin   heparin lock flush 100 unit/mL  500 Units Intravenous Once Alex Smith, Alex Ping, MD       luspatercept-aamt (REBLOZYL) subcutaneous injection 100 mg  100 mg Subcutaneous Q21 days Creig Hines, MD   100 mg at 08/27/22 1206   luspatercept-aamt (REBLOZYL) subcutaneous injection 100 mg  100 mg Subcutaneous Q21 days Creig Hines, MD   100 mg at 01/09/23 1428   sodium chloride 0.9 % injection 10 mL  10 mL Intravenous PRN Rosey Bath, MD   10 mL at 03/03/15 2841   sodium chloride 0.9 % injection 10 mL  10 mL Intracatheter PRN Rosey Bath, MD   10 mL at 03/10/15 1410   sodium chloride flush (NS) 0.9 % injection 10 mL  10 mL Intravenous PRN Rosey Bath, MD   10 mL at 07/23/18 1517    Allergies  Allergen Reactions   Rituximab Rash    Chest tightness Chest tightness   Blood-Group Specific Substance Other (See Comments)    Had a post transfusion reaction of red blood cells; NOW REQUIRES WASHED BLOOD CELLS Had a post transfusion reaction of red blood cells; NOW REQUIRES WASHED BLOOD CELLS   Primaxin [Imipenem] Other (See Comments)    Possible allergy Tolerates cephalosporins   Voriconazole Other (See Comments)   Sulfa Antibiotics Itching and Rash   Sulfacetamide Sodium Itching and Rash   Social History   Occupational History   Not on file  Tobacco Use   Smoking status: Former    Current packs/day: 0.00    Average packs/day: 2.0 packs/day for 25.0 years (50.0 ttl pk-yrs)    Types: Cigarettes    Start date: 02/07/1944    Quit date: 02/06/1969    Years since quitting: 54.1   Smokeless tobacco: Never   Tobacco comments:    07/07/2012 "stopped smoking ~ 40 yr ago; smoked 20-70yr"  Vaping Use   Vaping status: Never Used  Substance and Sexual Activity   Alcohol use: Not Currently   Drug use: No   Sexual activity: Never   Family History  Problem  Relation Age of Onset  Dementia Mother    Heart failure Father 65   Cancer Sister        breast   Diabetes Paternal Uncle    Diabetes Paternal Aunt    CAD Brother 44       MI   Stroke Neg Hx    Immunization History  Administered Date(s) Administered   Fluad Quad(high Dose 65+) 05/08/2021   Influenza, High Dose Seasonal PF 05/10/2016, 05/07/2017, 05/13/2018, 05/03/2019, 05/03/2019   Influenza,inj,Quad PF,6+ Mos 05/22/2015   Influenza,trivalent, recombinat, inj, PF 04/26/2014   Influenza-Unspecified 04/19/2012, 05/22/2015   PFIZER(Purple Top)SARS-COV-2 Vaccination 09/07/2019, 04/14/2020   Pneumococcal Conjugate-13 04/12/2014   Pneumococcal Polysaccharide-23 08/19/2004   Td 08/20/1995     Review of Systems: Negative except as noted in the HPI.   Objective: Vitals:   02/27/23 1413  BP: (!) 172/77  Pulse: 75   ROONEY CHERNESKY is a pleasant 88 y.o. male in NAD. AAO X 3. Vascular Examination: Capillary refill time immediate b/l. Vascular status intact b/l with palpable pedal pulses.Trace edema b/l LE. Pedal hair absent b/l. No pain with calf compression b/l. Skin temperature gradient WNL b/l. No cyanosis or clubbing b/l. No ischemia or gangrene noted b/l.   Neurological Examination: Sensation grossly intact b/l with 10 gram monofilament. Vibratory sensation intact b/l.   Dermatological Examination: Pedal skin with normal turgor, texture and tone b/l.  No open wounds. No interdigital macerations.   Toenails 1-5 b/l thick, discolored, elongated with subungual debris and pain on dorsal palpation.   Preulcerative lesion noted distal tip of right 3rd toe. There is visible subdermal hemorrhage. There is no surrounding erythema, no edema, no drainage, no odor, no fluctuance.  Musculoskeletal Examination: Muscle strength 5/5 to all lower extremity muscle groups bilaterally. Clawtoe deformity L 3rd toe.  Radiographs: None  Last A1c:      Latest Ref Rng & Units 11/18/2022   11:18  PM  Hemoglobin A1C  Hemoglobin-A1c 4.8 - 5.6 % 6.5    ADA Risk Categorization: Low Risk :  Patient has all of the following: Intact protective sensation No prior foot ulcer  No severe deformity Pedal pulses present  Assessment: 1. Pain due to onychomycosis of toenails of both feet   2. Pre-ulcerative corn or callous   3. Localized edema   4. Type 2 diabetes mellitus with stage 3 chronic kidney disease, with long-term current use of insulin, unspecified whether stage 3a or 3b CKD (HCC)   5. Encounter for diabetic foot exam [facility])     Plan: -Patient was evaluated and treated. All patient's and/or POA's questions/concerns answered on today's visit. -Diabetic foot examination performed today. -Continue diabetic foot care principles: inspect feet daily, monitor glucose as recommended by PCP and/or Endocrinologist, and follow prescribed diet per PCP, Endocrinologist and/or dietician. -Patient to continue soft, supportive shoe gear daily. -Toenails 1-5 b/l were debrided in length and girth with sterile nail nippers and dremel without iatrogenic bleeding.  -Preulcerative lesion pared L 3rd toe utilizing sterile scalpel blade. Total number pared=1. -Patient/POA to call should there be question/concern in the interim. Return in about 3 months (around 05/30/2023).  Freddie Breech, DPM

## 2023-03-14 ENCOUNTER — Inpatient Hospital Stay: Payer: PPO

## 2023-03-14 VITALS — BP 165/79 | HR 87

## 2023-03-14 DIAGNOSIS — Z5111 Encounter for antineoplastic chemotherapy: Secondary | ICD-10-CM | POA: Diagnosis not present

## 2023-03-14 DIAGNOSIS — D469 Myelodysplastic syndrome, unspecified: Secondary | ICD-10-CM

## 2023-03-14 DIAGNOSIS — D46Z Other myelodysplastic syndromes: Secondary | ICD-10-CM

## 2023-03-14 LAB — CMP (CANCER CENTER ONLY)
ALT: 11 U/L (ref 0–44)
AST: 19 U/L (ref 15–41)
Albumin: 3.4 g/dL — ABNORMAL LOW (ref 3.5–5.0)
Alkaline Phosphatase: 79 U/L (ref 38–126)
Anion gap: 10 (ref 5–15)
BUN: 33 mg/dL — ABNORMAL HIGH (ref 8–23)
CO2: 24 mmol/L (ref 22–32)
Calcium: 8.8 mg/dL — ABNORMAL LOW (ref 8.9–10.3)
Chloride: 101 mmol/L (ref 98–111)
Creatinine: 1.53 mg/dL — ABNORMAL HIGH (ref 0.61–1.24)
GFR, Estimated: 44 mL/min — ABNORMAL LOW (ref >60)
Glucose, Bld: 210 mg/dL — ABNORMAL HIGH (ref 70–99)
Potassium: 4.2 mmol/L (ref 3.5–5.1)
Sodium: 135 mmol/L (ref 135–145)
Total Bilirubin: 0.6 mg/dL (ref 0.3–1.2)
Total Protein: 7.3 g/dL (ref 6.5–8.1)

## 2023-03-14 LAB — CBC WITH DIFFERENTIAL (CANCER CENTER ONLY)
Abs Immature Granulocytes: 0.01 10*3/uL (ref 0.00–0.07)
Basophils Absolute: 0 10*3/uL (ref 0.0–0.1)
Basophils Relative: 1 %
Eosinophils Absolute: 0.1 10*3/uL (ref 0.0–0.5)
Eosinophils Relative: 2 %
HCT: 30.5 % — ABNORMAL LOW (ref 39.0–52.0)
Hemoglobin: 9.9 g/dL — ABNORMAL LOW (ref 13.0–17.0)
Immature Granulocytes: 0 %
Lymphocytes Relative: 32 %
Lymphs Abs: 1.4 10*3/uL (ref 0.7–4.0)
MCH: 33.4 pg (ref 26.0–34.0)
MCHC: 32.5 g/dL (ref 30.0–36.0)
MCV: 103 fL — ABNORMAL HIGH (ref 80.0–100.0)
Monocytes Absolute: 0.2 10*3/uL (ref 0.1–1.0)
Monocytes Relative: 5 %
Neutro Abs: 2.7 10*3/uL (ref 1.7–7.7)
Neutrophils Relative %: 60 %
Platelet Count: 111 10*3/uL — ABNORMAL LOW (ref 150–400)
RBC: 2.96 MIL/uL — ABNORMAL LOW (ref 4.22–5.81)
RDW: 16.9 % — ABNORMAL HIGH (ref 11.5–15.5)
WBC Count: 4.4 10*3/uL (ref 4.0–10.5)
nRBC: 0 % (ref 0.0–0.2)

## 2023-03-14 MED ORDER — LUSPATERCEPT-AAMT 75 MG ~~LOC~~ SOLR
100.0000 mg | SUBCUTANEOUS | Status: DC
  Administered 2023-03-14: 100 mg via SUBCUTANEOUS
  Filled 2023-03-14: qty 0.5

## 2023-04-04 ENCOUNTER — Inpatient Hospital Stay: Payer: PPO

## 2023-04-04 ENCOUNTER — Inpatient Hospital Stay: Payer: PPO | Attending: Nurse Practitioner

## 2023-04-04 DIAGNOSIS — D469 Myelodysplastic syndrome, unspecified: Secondary | ICD-10-CM

## 2023-04-04 DIAGNOSIS — Z5111 Encounter for antineoplastic chemotherapy: Secondary | ICD-10-CM | POA: Diagnosis present

## 2023-04-04 DIAGNOSIS — D46Z Other myelodysplastic syndromes: Secondary | ICD-10-CM | POA: Insufficient documentation

## 2023-04-04 LAB — CMP (CANCER CENTER ONLY)
ALT: 12 U/L (ref 0–44)
AST: 15 U/L (ref 15–41)
Albumin: 3.8 g/dL (ref 3.5–5.0)
Alkaline Phosphatase: 83 U/L (ref 38–126)
Anion gap: 6 (ref 5–15)
BUN: 30 mg/dL — ABNORMAL HIGH (ref 8–23)
CO2: 23 mmol/L (ref 22–32)
Calcium: 8.5 mg/dL — ABNORMAL LOW (ref 8.9–10.3)
Chloride: 105 mmol/L (ref 98–111)
Creatinine: 1.51 mg/dL — ABNORMAL HIGH (ref 0.61–1.24)
GFR, Estimated: 45 mL/min — ABNORMAL LOW (ref >60)
Glucose, Bld: 244 mg/dL — ABNORMAL HIGH (ref 70–99)
Potassium: 3.8 mmol/L (ref 3.5–5.1)
Sodium: 134 mmol/L — ABNORMAL LOW (ref 135–145)
Total Bilirubin: 0.4 mg/dL (ref 0.3–1.2)
Total Protein: 7.4 g/dL (ref 6.5–8.1)

## 2023-04-04 LAB — CBC WITH DIFFERENTIAL (CANCER CENTER ONLY)
Abs Immature Granulocytes: 0.01 10*3/uL (ref 0.00–0.07)
Basophils Absolute: 0 10*3/uL (ref 0.0–0.1)
Basophils Relative: 1 %
Eosinophils Absolute: 0.1 10*3/uL (ref 0.0–0.5)
Eosinophils Relative: 2 %
HCT: 31.1 % — ABNORMAL LOW (ref 39.0–52.0)
Hemoglobin: 10.1 g/dL — ABNORMAL LOW (ref 13.0–17.0)
Immature Granulocytes: 0 %
Lymphocytes Relative: 34 %
Lymphs Abs: 1.5 10*3/uL (ref 0.7–4.0)
MCH: 33.9 pg (ref 26.0–34.0)
MCHC: 32.5 g/dL (ref 30.0–36.0)
MCV: 104.4 fL — ABNORMAL HIGH (ref 80.0–100.0)
Monocytes Absolute: 0.2 10*3/uL (ref 0.1–1.0)
Monocytes Relative: 6 %
Neutro Abs: 2.5 10*3/uL (ref 1.7–7.7)
Neutrophils Relative %: 57 %
Platelet Count: 97 10*3/uL — ABNORMAL LOW (ref 150–400)
RBC: 2.98 MIL/uL — ABNORMAL LOW (ref 4.22–5.81)
RDW: 16.5 % — ABNORMAL HIGH (ref 11.5–15.5)
WBC Count: 4.3 10*3/uL (ref 4.0–10.5)
nRBC: 0 % (ref 0.0–0.2)

## 2023-04-04 MED ORDER — LUSPATERCEPT-AAMT 75 MG ~~LOC~~ SOLR
100.0000 mg | SUBCUTANEOUS | Status: DC
  Administered 2023-04-04: 100 mg via SUBCUTANEOUS
  Filled 2023-04-04: qty 0.5

## 2023-04-10 ENCOUNTER — Encounter: Payer: Self-pay | Admitting: Otolaryngology

## 2023-04-22 ENCOUNTER — Other Ambulatory Visit: Payer: PPO

## 2023-04-22 ENCOUNTER — Ambulatory Visit: Payer: PPO | Admitting: Oncology

## 2023-04-23 ENCOUNTER — Encounter: Payer: Self-pay | Admitting: Oncology

## 2023-04-23 ENCOUNTER — Inpatient Hospital Stay: Payer: PPO | Attending: Nurse Practitioner

## 2023-04-23 ENCOUNTER — Inpatient Hospital Stay (HOSPITAL_BASED_OUTPATIENT_CLINIC_OR_DEPARTMENT_OTHER): Payer: PPO | Admitting: Oncology

## 2023-04-23 ENCOUNTER — Inpatient Hospital Stay: Payer: PPO

## 2023-04-23 VITALS — BP 149/74 | HR 67 | Temp 96.0°F | Resp 18 | Ht 67.0 in | Wt 198.6 lb

## 2023-04-23 DIAGNOSIS — Z5111 Encounter for antineoplastic chemotherapy: Secondary | ICD-10-CM | POA: Insufficient documentation

## 2023-04-23 DIAGNOSIS — D649 Anemia, unspecified: Secondary | ICD-10-CM

## 2023-04-23 DIAGNOSIS — Z79899 Other long term (current) drug therapy: Secondary | ICD-10-CM

## 2023-04-23 DIAGNOSIS — E538 Deficiency of other specified B group vitamins: Secondary | ICD-10-CM | POA: Diagnosis not present

## 2023-04-23 DIAGNOSIS — D469 Myelodysplastic syndrome, unspecified: Secondary | ICD-10-CM

## 2023-04-23 DIAGNOSIS — D46Z Other myelodysplastic syndromes: Secondary | ICD-10-CM | POA: Insufficient documentation

## 2023-04-23 LAB — IRON AND TIBC
Iron: 116 ug/dL (ref 45–182)
Saturation Ratios: 35 % (ref 17.9–39.5)
TIBC: 336 ug/dL (ref 250–450)
UIBC: 220 ug/dL

## 2023-04-23 LAB — CBC WITH DIFFERENTIAL (CANCER CENTER ONLY)
Abs Immature Granulocytes: 0.02 10*3/uL (ref 0.00–0.07)
Basophils Absolute: 0 10*3/uL (ref 0.0–0.1)
Basophils Relative: 1 %
Eosinophils Absolute: 0.1 10*3/uL (ref 0.0–0.5)
Eosinophils Relative: 3 %
HCT: 34 % — ABNORMAL LOW (ref 39.0–52.0)
Hemoglobin: 11 g/dL — ABNORMAL LOW (ref 13.0–17.0)
Immature Granulocytes: 1 %
Lymphocytes Relative: 33 %
Lymphs Abs: 1.2 10*3/uL (ref 0.7–4.0)
MCH: 34 pg (ref 26.0–34.0)
MCHC: 32.4 g/dL (ref 30.0–36.0)
MCV: 104.9 fL — ABNORMAL HIGH (ref 80.0–100.0)
Monocytes Absolute: 0.2 10*3/uL (ref 0.1–1.0)
Monocytes Relative: 5 %
Neutro Abs: 2 10*3/uL (ref 1.7–7.7)
Neutrophils Relative %: 57 %
Platelet Count: 91 10*3/uL — ABNORMAL LOW (ref 150–400)
RBC: 3.24 MIL/uL — ABNORMAL LOW (ref 4.22–5.81)
RDW: 15.9 % — ABNORMAL HIGH (ref 11.5–15.5)
WBC Count: 3.6 10*3/uL — ABNORMAL LOW (ref 4.0–10.5)
nRBC: 0 % (ref 0.0–0.2)

## 2023-04-23 LAB — FERRITIN: Ferritin: 71 ng/mL (ref 24–336)

## 2023-04-23 NOTE — Progress Notes (Signed)
Hematology/Oncology Consult note [facility]  [phone] [phone]  Patient Care Team: [facility], [facility] as PCP - General (Geriatric Medicine) Rozetta Nunnery, MD as Referring Physician (Hematology and Oncology) Rozetta Nunnery, MD as Referring Physician (Hematology and Oncology) Creig Hines, MD as Consulting Physician (Oncology)   Name of the patient: Alex Smith  [id number]  1934/11/19   Date of visit: 04/23/23  Diagnosis- History of hairy cell leukemia 2.  Smoldering multiple myeloma under observation 3.  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 [name] and is transitioning his care to me.  He has following issues:   1.  Hairy cell leukemia that was initially diagnosed on bone marrow biopsy in June 2016.  Bone marrow biopsy at that time showed extensive marrow involvement with recurrent hairy cell leukemia approximately 80% of the cells in the core 10% monoclonal plasma cell infiltrate compatible with plasma cell neoplasm.  Peripheral smear revealed leukopenia with a white count of 1900 compatible with hairy cell leukemia. 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  FISH studies for MDS were negative.  Cytogenetics were normal (46, XY).  He has last received cladribine for this back in July 2016   2.  Smoldering multiple myeloma: Bone marrow biopsy in March 2017 revealed persistent plasma cell neoplasm with monoclonal plasma cells estimated at 20 to 30%.  No morphologic evidence of residual hairy cell leukemia.Marrow was normocellular for age with relative erythroid hyperplasia, relative myeloid hypoplasia, mild dyspoiesis, adequate megakaryocytes and no increased blasts.  There was no significant increase in marrow reticulin  fibers.  Iron was present.  FISH studies for MDS were negative.  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  Cytogenetics were normal (46, XY).  Last bone marrow biopsy was in October 2018 which showed variably cellular marrow 10 to 50% with kappa restricted plasmacytosis of 10 to 15%.  No evidence of leukemia lymphoma or high-grade dysplasia.  Normal cytogenetics.  He has also been followed by Dr. [name] at [facility].  He has received Revlimid and Decadron in the past in 2017 which was subsequently stopped.  PET scan in July 2017 showed no hypermetabolic adenopathy or focal skeletal lesions.   3.  MDS: He has received a trial of Procrit in the past in 2017 but apparently did not respond to it.  He has been getting periodic blood transfusions to maintain his hemoglobin closer to 9 as he is symptomatic when his hemoglobin is less than 9.  He has been getting about 2-3 blood transfusions during the year.  He does have some mild leukopenia/neutropenia and is on acyclovir prophylaxis.  He was getting weekly G-CSF when his ANC is less than 1.5.  He is not presently on G-CSF.  He is currently on Luspatercept for his anemia.   4.  Also has history of B12 deficiency for which he is on oral B12.    Interval history-overall patient feels better.  No recent hospitalizations since April.  Denies any recent falls.  Energy levels have improved  ECOG PS- 2 Pain scale- 0   Review of systems- Review of Systems  Constitutional:  Positive for malaise/fatigue. Negative for chills, fever and weight loss.  HENT:  Negative for congestion, ear discharge and nosebleeds.  Eyes:  Negative for blurred vision.  Respiratory:  Negative for cough, hemoptysis, sputum production, shortness of breath and wheezing.   Cardiovascular:  Negative for chest pain, palpitations, orthopnea and claudication.  Gastrointestinal:  Negative for abdominal pain, blood in stool, constipation, diarrhea,  heartburn, melena, nausea and vomiting.  Genitourinary:  Negative for dysuria, flank pain, frequency, hematuria and urgency.  Musculoskeletal:  Negative for back pain, joint pain and myalgias.  Skin:  Negative for rash.  Neurological:  Negative for dizziness, tingling, focal weakness, seizures, weakness and headaches.  Endo/Heme/Allergies:  Does not bruise/bleed easily.  Psychiatric/Behavioral:  Negative for depression and suicidal ideas. The patient does not have insomnia.       Allergies  Allergen Reactions   Rituximab Rash    Chest tightness Chest tightness   Blood-Group Specific Substance Other (See Comments)    Had a post transfusion reaction of red blood cells; NOW REQUIRES WASHED BLOOD CELLS Had a post transfusion reaction of red blood cells; NOW REQUIRES WASHED BLOOD CELLS   Primaxin [Imipenem] Other (See Comments)    Possible allergy Tolerates cephalosporins   Voriconazole Other (See Comments)   Sulfa Antibiotics Itching and Rash   Sulfacetamide Sodium Itching and Rash     Past Medical History:  Diagnosis Date   Alzheimer's dementia (HCC) 08/18/2021   Anemia    B12 deficiency    Basal cell carcinoma 03/30/2018   left ant nasal ala   Basal cell carcinoma of face 01/19/2013   right distal dorsum nose   BPH (benign prostatic hypertrophy)    followed by urology, discharged (Dr. 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 pretibial. EDC 09/07/2020   Squamous  cell carcinoma in situ 07/05/2020   Right medial pretibial. EDC 09/07/2020   Squamous cell carcinoma of skin 04/03/2020   Left cheek. WD SCC   Systolic murmur 05/28/2016   Type 2 diabetes, controlled, with retinopathy (HCC)      Past Surgical History:  Procedure Laterality Date   25 GAUGE PARS PLANA VITRECTOMY WITH 20 GAUGE MVR PORT FOR MACULAR HOLE  07/07/2012   Procedure: 25 GAUGE PARS PLANA VITRECTOMY WITH 20 GAUGE MVR PORT FOR MACULAR HOLE;  Surgeon: Sherrie George, MD;  Location: [facility] OR;  Service: Ophthalmology;  Laterality: Left;   BONE MARROW BIOPSY  2016   CARDIOVASCULAR STRESS TEST  2013   treadmill - no evidence ischemia, EF 61%   CATARACT EXTRACTION W/ INTRAOCULAR LENS  IMPLANT, BILATERAL  ~ 2010   COLONOSCOPY  2014   Elliot WNL no rpt needed, h/o polyps   EYE SURGERY Left 06/2012   laser surgery   GAS INSERTION  07/07/2012   Procedure: INSERTION OF GAS;  Surgeon: Sherrie George, MD;  Location[facility] OR;  Service: Ophthalmology;  Laterality: Left;  [id number]   PERIPHERAL VASCULAR CATHETERIZATION N/A 02/23/2015   Procedure: Shelda Pal Cath Insertion;  Surgeon: Marlow Baars  Wyn Quaker, MD;  Location: [facility] INVASIVE CV LAB;  Service: Cardiovascular;  Laterality: N/A;   PORTA CATH REMOVAL N/A 02/16/2020   Procedure: PORTA CATH REMOVAL;  Surgeon: Annice Needy, MD;  Location: [facility] INVASIVE CV LAB;  Service: Cardiovascular;  Laterality: N/A;   SERUM PATCH  07/07/2012   Procedure: SERUM PATCH;  Surgeon: Sherrie George, MD;  Location: [facility] OR;  Service: Ophthalmology;  Laterality: Left;   SKIN CANCER EXCISION     "all over my face" (07/07/2012)    Social History   Socioeconomic History   Marital status: Married    Spouse name: Not on file   Number of children: Not on file   Years of education: Not on file   Highest education level: Not on file  Occupational History   Not on file  Tobacco Use   Smoking status: Former    Current packs/day: 0.00    Average packs/day: 2.0 packs/day for 25.0 years (50.0  ttl pk-yrs)    Types: Cigarettes    Start date: 02/07/1944    Quit date: 02/06/1969    Years since quitting: 54.2   Smokeless tobacco: Never   Tobacco comments:    07/07/2012 "stopped smoking ~ 40 yr ago; smoked 20-25yr"  Vaping Use   Vaping status: Never Used  Substance and Sexual Activity   Alcohol use: Not Currently   Drug use: No   Sexual activity: Never  Other Topics Concern   Not on file  Social History Narrative   Lives at home alone. 1 cat   Occupation: was Tree surgeon, works part time for [employer]   Activity: likes to golf   Diet: moderate water, fruits/vegetables daily   Social Determinants of Corporate investment banker Strain: Not on file  Food Insecurity: Not on file  Transportation Needs: Not on file  Physical Activity: Not on file  Stress: Not on file  Social Connections: Not on file  Intimate Partner Violence: Not on file    Family History  Problem Relation Age of Onset   Dementia Mother    Heart failure Father 48   Cancer Sister        breast   Diabetes Paternal Uncle    Diabetes Paternal Aunt    CAD Brother 16       MI   Stroke Neg Hx      Current Outpatient Medications:    acetaminophen (TYLENOL) 500 MG tablet, Take 2 tablets (1,000 mg total) by mouth 2 (two) times daily., Disp: 120 tablet, Rfl: 0   albuterol (VENTOLIN HFA) 108 (90 Base) MCG/ACT inhaler, Inhale 2 puffs into the lungs every 6 (six) hours as needed for wheezing or shortness of breath., Disp: , Rfl:    amLODipine (NORVASC) 10 MG tablet, Take 1 tablet (10 mg total) by mouth daily., Disp: , Rfl:    aspirin EC 81 MG tablet, Take 81 mg by mouth daily., Disp: , Rfl:    donepezil (ARICEPT ODT) 5 MG disintegrating tablet, Take 5 mg by mouth at bedtime., Disp: , Rfl:    famotidine (PEPCID) 20 MG tablet, Take 20 mg by mouth daily., Disp: , Rfl:    insulin glargine (LANTUS) 100 UNIT/ML Solostar Pen, Inject 6 Units into the skin at bedtime., Disp: 15 mL, Rfl: 11   insulin lispro  (HUMALOG) 100 UNIT/ML KwikPen, Inject 2 Units into the skin 3 (three) times daily before meals. Per sliding scale, Disp: 15 mL, Rfl: 11   loperamide (IMODIUM A-D) 2 MG tablet,  Take 2 mg by mouth 4 (four) times daily as needed for diarrhea or loose stools., Disp: , Rfl:    losartan (COZAAR) 25 MG tablet, Take 1 tablet (25 mg total) by mouth daily., Disp: 30 tablet, Rfl: 0   magnesium hydroxide (MILK OF MAGNESIA) 400 MG/5ML suspension, Take 5 mLs by mouth daily as needed for mild constipation., Disp: , Rfl:    Multiple Vitamin (MULTIVITAMIN WITH MINERALS) TABS tablet, Take 1 tablet by mouth daily., Disp: , Rfl:    pantoprazole (PROTONIX) 40 MG tablet, Take 1 tablet (40 mg total) by mouth 2 (two) times daily., Disp: , Rfl:    pravastatin (PRAVACHOL) 20 MG tablet, Take 20 mg by mouth daily., Disp: , Rfl:    sertraline (ZOLOFT) 25 MG tablet, Take 25 mg by mouth daily., Disp: , Rfl:    tamsulosin (FLOMAX) 0.4 MG CAPS capsule, TAKE 1 CAPSULE BY MOUTH EVERY DAY, Disp: 30 capsule, Rfl: 3   torsemide (DEMADEX) 20 MG tablet, Take 20 mg by mouth daily., Disp: , Rfl:    vitamin B-12 (CYANOCOBALAMIN) 1000 MCG tablet, Take 1 tablet (1,000 mcg total) by mouth daily., Disp: 90 tablet, Rfl: 3   vitamin E 400 UNIT capsule, Take 400 Units by mouth daily., Disp: , Rfl:    alum & mag hydroxide-simeth (MAALOX/MYLANTA) 200-200-20 MG/5ML suspension, Take 30 mLs by mouth every 4 (four) hours as needed for indigestion or heartburn. (Patient not taking: Reported on 02/27/2023), Disp: 355 mL, Rfl: 0   ARICEPT 5 MG tablet, Take 5 mg by mouth daily. (Patient not taking: Reported on 02/27/2023), Disp: , Rfl:    clindamycin (CLEOCIN) 300 MG capsule, Take 300 mg by mouth 4 (four) times daily. (Patient not taking: Reported on 04/23/2023), Disp: , Rfl:    melatonin 5 MG TABS, Take 5 mg by mouth at bedtime. (Patient not taking: Reported on 02/27/2023), Disp: , Rfl:    QUEtiapine (SEROQUEL) 25 MG tablet, Take 0.5 tablets (12.5 mg total) by  mouth at bedtime as needed (insomnia, agitation). (Patient not taking: Reported on 04/23/2023), Disp: 15 tablet, Rfl: 0 No current facility-administered medications for this visit.  Facility-Administered Medications Ordered in Other Visits:    heparin lock flush 100 unit/mL, 500 Units, Intravenous, Once, Corcoran, Melissa C, MD   luspatercept-aamt (REBLOZYL) subcutaneous injection 100 mg, 100 mg, Subcutaneous, Q21 days, Creig Hines, MD, 100 mg at 08/27/22 1206   luspatercept-aamt (REBLOZYL) subcutaneous injection 100 mg, 100 mg, Subcutaneous, Q21 days, Creig Hines, MD, 100 mg at 01/09/23 1428   sodium chloride 0.9 % injection 10 mL, 10 mL, Intravenous, PRN, [name], Melissa C, MD, 10 mL at 03/03/15 0903   sodium chloride 0.9 % injection 10 mL, 10 mL, Intracatheter, PRN, [name], Melissa C, MD, 10 mL at 03/10/15 1410   sodium chloride flush (NS) 0.9 % injection 10 mL, 10 mL, Intravenous, PRN, Nelva Nay C, MD, 10 mL at 07/23/18 1517  Physical exam:  Vitals:   04/23/23 0956 04/23/23 1003  BP: (!) 156/83 (!) 149/74  Pulse: 67   Resp: 18   Temp: (!) 96 F (35.6 C)   TempSrc: Tympanic   SpO2: 98%   Weight: 198 lb 9.6 oz (90.1 kg)   Height: 5\' 7"  (1.702 m)    Physical Exam Constitutional:      Comments: Ambulates with a walker.  Appears in no acute distress  Cardiovascular:     Rate and Rhythm: Normal rate and regular rhythm.     Heart sounds: Murmur heard.  Pulmonary:     Effort: Pulmonary effort is normal.     Breath sounds: Normal breath sounds.  Abdominal:     General: Bowel sounds are normal.     Palpations: Abdomen is soft.  Musculoskeletal:     Cervical back: Normal range of motion.  Skin:    General: Skin is warm and dry.  Neurological:     Mental Status: He is alert and oriented to person, place, and time.         Latest Ref Rng & Units 04/04/2023    1:04 PM  CMP  Glucose 70 - 99 mg/dL 784   BUN 8 - 23 mg/dL 30   Creatinine 6.96 - 1.24 mg/dL 2.95    Sodium 284 - 132 mmol/L 134   Potassium 3.5 - 5.1 mmol/L 3.8   Chloride 98 - 111 mmol/L 105   CO2 22 - 32 mmol/L 23   Calcium 8.9 - 10.3 mg/dL 8.5   Total Protein 6.5 - 8.1 g/dL 7.4   Total Bilirubin 0.3 - 1.2 mg/dL 0.4   Alkaline Phos 38 - 126 U/L 83   AST 15 - 41 U/L 15   ALT 0 - 44 U/L 12       Latest Ref Rng & Units 04/23/2023    9:30 AM  CBC  WBC 4.0 - 10.5 K/uL 3.6   Hemoglobin 13.0 - 17.0 g/dL 44.0   Hematocrit 10.2 - 52.0 % 34.0   Platelets 150 - 400 K/uL 91      Assessment and plan- Patient is a 88 y.o. male with history of smoldering multiple myeloma and MDS currently on Luspatercept here for routine follow-up  MDS: Patient has been on Luspatercept for a couple of years now.  His hemoglobin had drifted down to the eights and was in that range between April to July 2024.  Subsequently his hemoglobin has improved and is presently at 11.  He has mild leukopenia and thrombocytopenia which is chronic secondary to MDS.  Patient would like to hold off on going to [facility] at this time given improvement in his hemoglobin.  He received last dose of Luspatercept on 04/04/2023 and therefore I am holding off on giving him the next dose today.  We will plan to bring him back in about 2 weeks to receive his Luspatercept and following that he will receive the drug every 3 weeks.  If his hemoglobin remains stable around 10 I will try to stretch out the Luspatercept to every 4 to 5 weeks.  Smoldering multiple myeloma: Labs have remained stable and he did undergo bone marrow biopsy in April 2024 as well which did not show any overt progression of his smoldering myeloma  Luspatercept every 3 weeks.  CBC ferritin and iron studies in 3 and 6 months and I will see him back in 6 months   Visit Diagnosis 1. 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 B12 deficiency   3. Symptomatic anemia   4. High risk medication use      Dr. Owens Shark, MD, MPH [facility] [phone] 04/23/2023 12:30 PM

## 2023-04-24 ENCOUNTER — Telehealth: Payer: Self-pay | Admitting: *Deleted

## 2023-04-24 LAB — KAPPA/LAMBDA LIGHT CHAINS
Kappa free light chain: 65.9 mg/L — ABNORMAL HIGH (ref 3.3–19.4)
Kappa, lambda light chain ratio: 5.54 — ABNORMAL HIGH (ref 0.26–1.65)
Lambda free light chains: 11.9 mg/L (ref 5.7–26.3)

## 2023-04-24 NOTE — Telephone Encounter (Signed)
Pt's son could not come to the appt yest. He has covid. He wanted to know about the [facility] appt on November. Dr. Smith Robert spoke to Mr. Loner and he feels that he is doing better and it is a lot for the pt to go that far. Dr. Smith Robert was ok with this and if he should need appt in future we will work on that. I called the [facility] today and spoke to [place] and told her we are cancelling this appt. I told the son that I would do that for them and if had issues I can call him. Every thing is good .

## 2023-04-25 ENCOUNTER — Inpatient Hospital Stay: Payer: PPO

## 2023-04-27 LAB — MULTIPLE MYELOMA PANEL, SERUM
Albumin SerPl Elph-Mcnc: 3.7 g/dL (ref 2.9–4.4)
Albumin/Glob SerPl: 1.1 (ref 0.7–1.7)
Alpha 1: 0.2 g/dL (ref 0.0–0.4)
Alpha2 Glob SerPl Elph-Mcnc: 0.6 g/dL (ref 0.4–1.0)
B-Globulin SerPl Elph-Mcnc: 0.8 g/dL (ref 0.7–1.3)
Gamma Glob SerPl Elph-Mcnc: 1.7 g/dL (ref 0.4–1.8)
Globulin, Total: 3.4 g/dL (ref 2.2–3.9)
IgA: 15 mg/dL — ABNORMAL LOW (ref 61–437)
IgG (Immunoglobin G), Serum: 1974 mg/dL — ABNORMAL HIGH (ref 603–1613)
IgM (Immunoglobulin M), Srm: 29 mg/dL (ref 15–143)
M Protein SerPl Elph-Mcnc: 1.5 g/dL — ABNORMAL HIGH
Total Protein ELP: 7.1 g/dL (ref 6.0–8.5)

## 2023-05-07 ENCOUNTER — Inpatient Hospital Stay: Payer: PPO

## 2023-05-07 VITALS — BP 165/75 | HR 69

## 2023-05-07 DIAGNOSIS — Z5111 Encounter for antineoplastic chemotherapy: Secondary | ICD-10-CM | POA: Diagnosis not present

## 2023-05-07 DIAGNOSIS — D46Z Other myelodysplastic syndromes: Secondary | ICD-10-CM

## 2023-05-07 MED ORDER — LUSPATERCEPT-AAMT 75 MG ~~LOC~~ SOLR
100.0000 mg | SUBCUTANEOUS | Status: DC
  Administered 2023-05-07: 100 mg via SUBCUTANEOUS
  Filled 2023-05-07: qty 1.5

## 2023-05-14 ENCOUNTER — Inpatient Hospital Stay: Payer: PPO

## 2023-05-20 DIAGNOSIS — M159 Polyosteoarthritis, unspecified: Secondary | ICD-10-CM | POA: Insufficient documentation

## 2023-05-20 DIAGNOSIS — R2681 Unsteadiness on feet: Secondary | ICD-10-CM | POA: Insufficient documentation

## 2023-05-20 DIAGNOSIS — E876 Hypokalemia: Secondary | ICD-10-CM | POA: Insufficient documentation

## 2023-05-28 ENCOUNTER — Inpatient Hospital Stay: Payer: PPO

## 2023-05-28 ENCOUNTER — Inpatient Hospital Stay: Payer: PPO | Attending: Nurse Practitioner

## 2023-05-28 VITALS — BP 140/72

## 2023-05-28 DIAGNOSIS — D46Z Other myelodysplastic syndromes: Secondary | ICD-10-CM | POA: Insufficient documentation

## 2023-05-28 DIAGNOSIS — Z5111 Encounter for antineoplastic chemotherapy: Secondary | ICD-10-CM | POA: Diagnosis present

## 2023-05-28 LAB — CBC WITH DIFFERENTIAL (CANCER CENTER ONLY)
Abs Immature Granulocytes: 0.01 10*3/uL (ref 0.00–0.07)
Basophils Absolute: 0 10*3/uL (ref 0.0–0.1)
Basophils Relative: 1 %
Eosinophils Absolute: 0.1 10*3/uL (ref 0.0–0.5)
Eosinophils Relative: 2 %
HCT: 29.8 % — ABNORMAL LOW (ref 39.0–52.0)
Hemoglobin: 9.9 g/dL — ABNORMAL LOW (ref 13.0–17.0)
Immature Granulocytes: 0 %
Lymphocytes Relative: 30 %
Lymphs Abs: 1.1 10*3/uL (ref 0.7–4.0)
MCH: 34 pg (ref 26.0–34.0)
MCHC: 33.2 g/dL (ref 30.0–36.0)
MCV: 102.4 fL — ABNORMAL HIGH (ref 80.0–100.0)
Monocytes Absolute: 0.3 10*3/uL (ref 0.1–1.0)
Monocytes Relative: 7 %
Neutro Abs: 2.2 10*3/uL (ref 1.7–7.7)
Neutrophils Relative %: 60 %
Platelet Count: 87 10*3/uL — ABNORMAL LOW (ref 150–400)
RBC: 2.91 MIL/uL — ABNORMAL LOW (ref 4.22–5.81)
RDW: 15.5 % (ref 11.5–15.5)
WBC Count: 3.6 10*3/uL — ABNORMAL LOW (ref 4.0–10.5)
nRBC: 0 % (ref 0.0–0.2)

## 2023-05-28 MED ORDER — LUSPATERCEPT-AAMT 75 MG ~~LOC~~ SOLR
100.0000 mg | SUBCUTANEOUS | Status: DC
  Administered 2023-05-28: 100 mg via SUBCUTANEOUS
  Filled 2023-05-28: qty 0.5

## 2023-05-29 ENCOUNTER — Telehealth: Payer: Self-pay

## 2023-05-29 NOTE — Telephone Encounter (Signed)
error 

## 2023-06-02 ENCOUNTER — Ambulatory Visit: Payer: PPO | Admitting: Podiatry

## 2023-06-04 ENCOUNTER — Inpatient Hospital Stay: Payer: PPO

## 2023-06-18 ENCOUNTER — Inpatient Hospital Stay: Payer: PPO

## 2023-06-18 ENCOUNTER — Telehealth: Payer: Self-pay | Admitting: Oncology

## 2023-06-18 NOTE — Telephone Encounter (Signed)
Reschedule 2 weeks from now

## 2023-06-18 NOTE — Telephone Encounter (Signed)
Pt daughter called and stated pt is sick and has a virus that is going around. They canceled the injection for today.   They want to know when they need to r/s this appt.  Megan please call pt daughter back after 11am today. Thank you

## 2023-06-20 ENCOUNTER — Telehealth: Payer: Self-pay | Admitting: *Deleted

## 2023-06-20 NOTE — Telephone Encounter (Signed)
Push out all appointments by 2 weeks

## 2023-06-20 NOTE — Telephone Encounter (Signed)
Alex Smith called erporting that patient had been sick so he had to reschedule an injection appointment so now he has one on 11/13 and 11/20, she is asking if she needs to cancel the one on 11/20 Please advise

## 2023-06-25 ENCOUNTER — Inpatient Hospital Stay: Payer: PPO

## 2023-06-25 ENCOUNTER — Ambulatory Visit: Payer: PPO | Admitting: Dermatology

## 2023-06-25 ENCOUNTER — Encounter: Payer: Self-pay | Admitting: Dermatology

## 2023-06-25 DIAGNOSIS — C44319 Basal cell carcinoma of skin of other parts of face: Secondary | ICD-10-CM | POA: Diagnosis not present

## 2023-06-25 DIAGNOSIS — Z86018 Personal history of other benign neoplasm: Secondary | ICD-10-CM

## 2023-06-25 DIAGNOSIS — L570 Actinic keratosis: Secondary | ICD-10-CM | POA: Diagnosis not present

## 2023-06-25 DIAGNOSIS — W908XXA Exposure to other nonionizing radiation, initial encounter: Secondary | ICD-10-CM

## 2023-06-25 DIAGNOSIS — Z8589 Personal history of malignant neoplasm of other organs and systems: Secondary | ICD-10-CM

## 2023-06-25 DIAGNOSIS — L821 Other seborrheic keratosis: Secondary | ICD-10-CM

## 2023-06-25 DIAGNOSIS — L281 Prurigo nodularis: Secondary | ICD-10-CM

## 2023-06-25 DIAGNOSIS — L280 Lichen simplex chronicus: Secondary | ICD-10-CM

## 2023-06-25 DIAGNOSIS — D492 Neoplasm of unspecified behavior of bone, soft tissue, and skin: Secondary | ICD-10-CM | POA: Diagnosis not present

## 2023-06-25 DIAGNOSIS — L578 Other skin changes due to chronic exposure to nonionizing radiation: Secondary | ICD-10-CM

## 2023-06-25 DIAGNOSIS — Z85828 Personal history of other malignant neoplasm of skin: Secondary | ICD-10-CM

## 2023-06-25 DIAGNOSIS — D229 Melanocytic nevi, unspecified: Secondary | ICD-10-CM

## 2023-06-25 DIAGNOSIS — D1801 Hemangioma of skin and subcutaneous tissue: Secondary | ICD-10-CM

## 2023-06-25 DIAGNOSIS — Z1283 Encounter for screening for malignant neoplasm of skin: Secondary | ICD-10-CM

## 2023-06-25 DIAGNOSIS — L814 Other melanin hyperpigmentation: Secondary | ICD-10-CM

## 2023-06-25 NOTE — Patient Instructions (Addendum)
Pending PCP approval Recommend N-acetylcysteine (NAC) 600 mg supplement 1 -2 pills 2 times per day to help with picking   Wound Care Instructions  Cleanse wound gently with soap and water once a day then pat dry with clean gauze. Apply a thin coat of Petrolatum (petroleum jelly, "Vaseline") over the wound (unless you have an allergy to this). We recommend that you use a new, sterile tube of Vaseline. Do not pick or remove scabs. Do not remove the yellow or white "healing tissue" from the base of the wound.  Cover the wound with fresh, clean, nonstick gauze and secure with paper tape. You may use Band-Aids in place of gauze and tape if the wound is small enough, but would recommend trimming much of the tape off as there is often too much. Sometimes Band-Aids can irritate the skin.  You should call the office for your biopsy report after 1 week if you have not already been contacted.  If you experience any problems, such as abnormal amounts of bleeding, swelling, significant bruising, significant pain, or evidence of infection, please call the office immediately.  FOR ADULT SURGERY PATIENTS: If you need something for pain relief you may take 1 extra strength Tylenol (acetaminophen) AND 2 Ibuprofen (200mg  each) together every 4 hours as needed for pain. (do not take these if you are allergic to them or if you have a reason you should not take them.) Typically, you may only need pain medication for 1 to 3 days.    Due to recent changes in healthcare laws, you may see results of your pathology and/or laboratory studies on MyChart before the doctors have had a chance to review them. We understand that in some cases there may be results that are confusing or concerning to you. Please understand that not all results are received at the same time and often the doctors may need to interpret multiple results in order to provide you with the best plan of care or course of treatment. Therefore, we ask that you  please give [place] 2 business days to thoroughly review all your results before contacting the office for clarification. Should we see a critical lab result, you will be contacted sooner.   If You Need Anything After Your Visit  If you have any questions or concerns for your doctor, please call our main line at [phone] and press option 4 to reach your doctor's medical assistant. If no one answers, please leave a voicemail as directed and we will return your call as soon as possible. Messages left after 4 pm will be answered the following business day.   You may also send [place] a message via MyChart. We typically respond to MyChart messages within 1-2 business days.  For prescription refills, please ask your pharmacy to contact our office. Our fax number is [phone].  If you have an urgent issue when the clinic is closed that cannot wait until the next business day, you can page your doctor at the number below.    Please note that while we do our best to be available for urgent issues outside of office hours, we are not available 24/7.   If you have an urgent issue and are unable to reach [place], you may choose to seek medical care at your doctor's office, retail clinic, urgent care center, or emergency room.  If you have a medical emergency, please immediately call 911 or go to the emergency department.  Pager Numbers  - Dr. Gwen Pounds: [phone]  -  Dr. Roseanne Reno: [phone]  - Dr. Katrinka Blazing: [phone]   In the event of inclement weather, please call our main line at [phone] for an update on the status of any delays or closures.  Dermatology Medication Tips: Please keep the boxes that topical medications come in in order to help keep track of the instructions about where and how to use these. Pharmacies typically print the medication instructions only on the boxes and not directly on the medication tubes.   If your medication is too expensive, please contact our office at  [phone] option 4 or send [place] a message through MyChart.   We are unable to tell what your co-pay for medications will be in advance as this is different depending on your insurance coverage. However, we may be able to find a substitute medication at lower cost or fill out paperwork to get insurance to cover a needed medication.   If a prior authorization is required to get your medication covered by your insurance company, please allow [place] 1-2 business days to complete this process.  Drug prices often vary depending on where the prescription is filled and some pharmacies may offer cheaper prices.  The website www.goodrx.com contains coupons for medications through different pharmacies. The prices here do not account for what the cost may be with help from insurance (it may be cheaper with your insurance), but the website can give you the price if you did not use any insurance.  - You can print the associated coupon and take it with your prescription to the pharmacy.  - You may also stop by our office during regular business hours and pick up a GoodRx coupon card.  - If you need your prescription sent electronically to a different pharmacy, notify our office through [facility] or by phone at [phone] option 4.     Si Usted Necesita Algo Despus de Su Visita  Tambin puede enviarnos un mensaje a travs de Clinical cytogeneticist. Por lo general respondemos a los mensajes de MyChart en el transcurso de 1 a 2 das hbiles.  Para renovar recetas, por favor pida a su farmacia que se ponga en contacto con nuestra oficina. Annie Sable de fax es [place] [phone].  Si tiene un asunto urgente cuando la clnica est cerrada y que no puede esperar hasta el siguiente da hbil, puede llamar/localizar a su doctor(a) al nmero que aparece a continuacin.   Por favor, tenga en cuenta que aunque hacemos todo lo posible para estar disponibles para asuntos urgentes fuera del horario de [place], no estamos  disponibles las 24 horas del da, los 7 [address]  [address] de la [place].   Si tiene un problema urgente y no puede comunicarse con nosotros, puede optar por buscar atencin mdica  en el consultorio de su doctor(a), en una clnica privada, en un centro de atencin urgente o en una sala de emergencias.  Si tiene Engineer, drilling, por favor llame inmediatamente al 911 o vaya a la sala de emergencias.  Nmeros de bper  - Dr. Gwen Pounds: [phone]  - Dra. Roseanne Reno: [phone]  - Dr. Katrinka Blazing: [phone]   En caso de inclemencias del tiempo, por favor llame a Lacy Duverney principal al [phone] para una actualizacin sobre el [place] de cualquier retraso o cierre.  Consejos para la medicacin en dermatologa: Por favor, guarde las cajas en las que vienen los medicamentos de uso tpico para ayudarle a seguir las instrucciones sobre dnde y cmo usarlos. Las farmacias generalmente imprimen las instrucciones del medicamento slo en las  cajas y no directamente en los tubos del medicamento.   Si su medicamento es muy caro, por favor, pngase en contacto con Rolm Gala llamando al [phone] y presione la opcin 4 o envenos un mensaje a travs de Clinical cytogeneticist.   No podemos decirle cul ser su copago por los medicamentos por adelantado ya que esto es diferente dependiendo de la cobertura de su seguro. Sin embargo, es posible que podamos encontrar un medicamento sustituto a Audiological scientist un formulario para que el seguro cubra el medicamento que se considera necesario.   Si se requiere una autorizacin previa para que su compaa de seguros [place] su medicamento, por favor permtanos de 1 a 2 das hbiles para completar [address]  Los precios de los medicamentos varan con frecuencia dependiendo del Environmental consultant de dnde se surte la receta y alguna farmacias pueden ofrecer precios ms baratos.  El sitio web www.goodrx.com tiene cupones para medicamentos de Health and safety inspector. Los precios aqu no  tienen en cuenta lo que podra costar con la ayuda del seguro (puede ser ms barato con su seguro), pero el sitio web puede darle el precio si no utiliz Tourist information centre manager.  - Puede imprimir el cupn correspondiente y llevarlo con su receta a la farmacia.  - Tambin puede pasar por nuestra oficina durante el horario de atencin regular y Education officer, museum una tarjeta de cupones de GoodRx.  - Si necesita que su receta se enve electrnicamente a una farmacia diferente, informe a nuestra oficina a travs de MyChart de [place] o por telfono llamando al [phone] y presione la opcin 4.

## 2023-06-25 NOTE — Progress Notes (Signed)
Follow-Up Visit   Subjective  Alex Smith is a 88 y.o. male who presents for the following: Skin Cancer Screening and Full Body Skin Exam hx of BCCs, SCC, Dysplastic Nevus, Aks, check spots face, neck, pt hx of LSC did not get SM anti itch  The patient presents for Total-Body Skin Exam (TBSE) for skin cancer screening and mole check. The patient has spots, moles and lesions to be evaluated, some may be new or changing and the patient may have concern these could be cancer.  Patient accompanied by daughter.  The following portions of the chart were reviewed this encounter and updated as appropriate: medications, allergies, medical history  Review of Systems:  No other skin or systemic complaints except as noted in HPI or Assessment and Plan.  Objective  Well appearing patient in no apparent distress; mood and affect are within normal limits.  A full examination was performed including scalp, head, eyes, ears, nose, lips, neck, chest, axillae, abdomen, back, buttocks, bilateral upper extremities, bilateral lower extremities, hands, feet, fingers, toes, fingernails, and toenails. All findings within normal limits unless otherwise noted below.   Relevant physical exam findings are noted in the Assessment and Plan.  R cheek Crust with surrounding shiny tissue 1.5cm     R pre auricular x 1 Pink scaly macules       Assessment & Plan   SKIN CANCER SCREENING PERFORMED TODAY.  ACTINIC DAMAGE - Chronic condition, secondary to cumulative UV/sun exposure - diffuse scaly erythematous macules with underlying dyspigmentation - Recommend daily broad spectrum sunscreen SPF 30+ to sun-exposed areas, reapply every 2 hours as needed.  - Staying in the shade or wearing long sleeves, sun glasses (UVA+UVB protection) and wide brim hats (4-inch brim around the entire circumference of the hat) are also recommended for sun protection.  - Call for new or changing lesions.  LENTIGINES,  SEBORRHEIC KERATOSES, HEMANGIOMAS - Benign normal skin lesions - Benign-appearing - Call for any changes  MELANOCYTIC NEVI - Tan-brown and/or pink-flesh-colored symmetric macules and papules - Benign appearing on exam today - Observation - Call clinic for new or changing moles - Recommend daily use of broad spectrum spf 30+ sunscreen to sun-exposed areas.   HISTORY OF BASAL CELL CARCINOMA OF THE SKIN - No evidence of recurrence today - Recommend regular full body skin exams - Recommend daily broad spectrum sunscreen SPF 30+ to sun-exposed areas, reapply every 2 hours as needed.  - Call if any new or changing lesions are noted between office visits  - L ant nasal ala, R distal dorsum nose  Neoplasm of skin R cheek  Skin / nail biopsy Type of biopsy: tangential   Informed consent: discussed and consent obtained   Timeout: patient name, date of birth, surgical site, and procedure verified   Procedure prep:  Patient was prepped and draped in usual sterile fashion Prep type:  Isopropyl alcohol Anesthesia: the lesion was anesthetized in a standard fashion   Anesthetic:  1% lidocaine w/ epinephrine 1-100,000 buffered w/ 8.4% NaHCO3 Instrument used: flexible razor blade   Outcome: patient tolerated procedure well   Post-procedure details: sterile dressing applied and wound care instructions given   Dressing type: bandage and bacitracin    Specimen 1 - Surgical pathology Differential Diagnosis: LSC vs Prurigo Nodule vs other  Check Margins: No Crust with surrounding shiny tissue  AK (actinic keratosis) R pre auricular x 1  Actinic keratoses are precancerous spots that appear secondary to cumulative UV radiation exposure/sun exposure over  time. They are chronic with expected duration over 1 year. A portion of actinic keratoses will progress to squamous cell carcinoma of the skin. It is not possible to reliably predict which spots will progress to skin cancer and so treatment is  recommended to prevent development of skin cancer.  Recommend daily broad spectrum sunscreen SPF 30+ to sun-exposed areas, reapply every 2 hours as needed.  Recommend staying in the shade or wearing long sleeves, sun glasses (UVA+UVB protection) and wide brim hats (4-inch brim around the entire circumference of the hat). Call for new or changing lesions.  Destruction of lesion - R pre auricular x 1 Complexity: simple   Destruction method: cryotherapy   Informed consent: discussed and consent obtained   Timeout:  patient name, date of birth, surgical site, and procedure verified Lesion destroyed using liquid nitrogen: Yes   Region frozen until ice ball extended beyond lesion: Yes   Outcome: patient tolerated procedure well with no complications   Post-procedure details: wound care instructions given    Actinic skin damage  Skin cancer screening  Lentigo  Melanocytic nevus, unspecified location  History of squamous cell carcinoma  History of dysplastic nevus  Lichen simplex chronicus  Prurigo nodularis  HISTORY OF SQUAMOUS CELL CARCINOMA OF THE SKIN - No evidence of recurrence today - No lymphadenopathy - Recommend regular full body skin exams - Recommend daily broad spectrum sunscreen SPF 30+ to sun-exposed areas, reapply every 2 hours as needed.  - Call if any new or changing lesions are noted between office visits - L cheek   HISTORY OF DYSPLASTIC NEVUS No evidence of recurrence today Recommend regular full body skin exams Recommend daily broad spectrum sunscreen SPF 30+ to sun-exposed areas, reapply every 2 hours as needed.  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 Improved from last visit  Chronic and persistent condition with duration or expected duration over one year. Condition is improving with treatment but not currently at  goal.  Face, post neck, scalp Exam: Hyperpigmented lichenified  plaques at face, scalp, post neck Chronic and persistent condition with duration or expected duration over one year. Condition is bothersome/symptomatic for patient. Currently flared. Patient Education: -LSC is a common form of chronic neurodermatitis that presents as dry, patchy areas of skin that are scaly and thick. The hypertrophic epidermis generally seen is typically the result of habitual scratching or rubbing of a specific area of the skin. Treatment Plan: Pending approval form PCP Recommend N-acetylcysteine (NAC) 600 mg supplement 1-2 pills 2 times per day to help with picking  Return in about 1 year (around 06/24/2024) for TBSE, Hx of BCC, Hx of SCC, Hx of Dysplastic nevi, Hx of AKs.  I, Ardis Rowan, RMA, am acting as scribe for Armida Sans, MD .   Documentation: I have reviewed the above documentation for accuracy and completeness, and I agree with the above.  Armida Sans, MD

## 2023-06-26 ENCOUNTER — Telehealth: Payer: Self-pay

## 2023-06-26 NOTE — Telephone Encounter (Signed)
Pharmacy called regarding the prescription: N-acetylcysteine (NAC) 600 mg supplement 1-2 pills 2 times per day to help with picking.  Because he is in a nursing home, he has to have exact directions for dispensing. Do you want him given 1 or 2 pills daily?

## 2023-06-26 NOTE — Telephone Encounter (Signed)
Called clarification in and per pharmacist family wants to d/c orders.  Barbara Cower with pharmacy discontinued prescription.

## 2023-07-01 LAB — SURGICAL PATHOLOGY

## 2023-07-02 ENCOUNTER — Inpatient Hospital Stay: Payer: PPO | Attending: Nurse Practitioner

## 2023-07-02 ENCOUNTER — Other Ambulatory Visit: Payer: Self-pay

## 2023-07-02 ENCOUNTER — Telehealth: Payer: Self-pay

## 2023-07-02 VITALS — BP 130/80 | HR 70

## 2023-07-02 DIAGNOSIS — D46Z Other myelodysplastic syndromes: Secondary | ICD-10-CM | POA: Diagnosis present

## 2023-07-02 DIAGNOSIS — Z5111 Encounter for antineoplastic chemotherapy: Secondary | ICD-10-CM | POA: Insufficient documentation

## 2023-07-02 MED ORDER — LUSPATERCEPT-AAMT 75 MG ~~LOC~~ SOLR
100.0000 mg | SUBCUTANEOUS | Status: DC
  Administered 2023-07-02: 100 mg via SUBCUTANEOUS
  Filled 2023-07-02: qty 1.5

## 2023-07-02 NOTE — Telephone Encounter (Signed)
Left voicemail to return my call

## 2023-07-02 NOTE — Telephone Encounter (Signed)
-----   Message from Armida Sans sent at 07/01/2023  5:24 PM EST ----- FINAL DIAGNOSIS        1. Skin, R cheek :       BASAL CELL CARCINOMA, NODULAR PATTERN   Cancer = BCC Schedule for treatment ([facility])

## 2023-07-03 ENCOUNTER — Telehealth: Payer: Self-pay

## 2023-07-03 NOTE — Telephone Encounter (Signed)
Left message on voicemail to return my call.  

## 2023-07-03 NOTE — Telephone Encounter (Signed)
Discussed pathology results with patient's son and scheduled appointment.

## 2023-07-03 NOTE — Telephone Encounter (Signed)
-----   Message from Alex Smith sent at 07/01/2023  5:24 PM EST ----- FINAL DIAGNOSIS        1. Skin, R cheek :       BASAL CELL CARCINOMA, NODULAR PATTERN   Cancer = BCC Schedule for treatment ([facility])

## 2023-07-03 NOTE — Telephone Encounter (Signed)
-----   Message from Armida Sans sent at 07/01/2023  5:24 PM EST ----- FINAL DIAGNOSIS        1. Skin, R cheek :       BASAL CELL CARCINOMA, NODULAR PATTERN   Cancer = BCC Schedule for treatment ([facility])

## 2023-07-08 ENCOUNTER — Ambulatory Visit: Payer: PPO | Admitting: Dermatology

## 2023-07-08 DIAGNOSIS — L821 Other seborrheic keratosis: Secondary | ICD-10-CM

## 2023-07-08 DIAGNOSIS — L578 Other skin changes due to chronic exposure to nonionizing radiation: Secondary | ICD-10-CM

## 2023-07-08 DIAGNOSIS — W908XXA Exposure to other nonionizing radiation, initial encounter: Secondary | ICD-10-CM | POA: Diagnosis not present

## 2023-07-08 DIAGNOSIS — L570 Actinic keratosis: Secondary | ICD-10-CM

## 2023-07-08 DIAGNOSIS — C44319 Basal cell carcinoma of skin of other parts of face: Secondary | ICD-10-CM | POA: Diagnosis not present

## 2023-07-08 NOTE — Patient Instructions (Addendum)
Electrodesiccation and Curettage ("Scrape and Burn") Wound Care Instructions  Leave the original bandage on for 24 hours if possible.  If the bandage becomes soaked or soiled before that time, it is OK to remove it and examine the wound.  A small amount of post-operative bleeding is normal.  If excessive bleeding occurs, remove the bandage, place gauze over the site and apply continuous pressure (no peeking) over the area for 30 minutes. If this does not work, please call our clinic as soon as possible or page your doctor if it is after hours.   Once a day, cleanse the wound with soap and water. It is fine to shower. If a thick crust develops you may use a Q-tip dipped into dilute hydrogen peroxide (mix 1:1 with water) to dissolve it.  Hydrogen peroxide can slow the healing process, so use it only as needed.    After washing, apply petroleum jelly (Vaseline) or an antibiotic ointment if your doctor prescribed one for you, followed by a bandage.    For best healing, the wound should be covered with a layer of ointment at all times. If you are not able to keep the area covered with a bandage to hold the ointment in place, this may mean re-applying the ointment several times a day.  Continue this wound care until the wound has healed and is no longer open. It may take several weeks for the wound to heal and close.  Itching and mild discomfort is normal during the healing process.  If you have any discomfort, you can take Tylenol (acetaminophen) or ibuprofen as directed on the bottle. (Please do not take these if you have an allergy to them or cannot take them for another reason).  Some redness, tenderness and white or yellow material in the wound is normal healing.  If the area becomes very sore and red, or develops a thick yellow-green material (pus), it may be infected; please notify us.    Wound healing continues for up to one year following surgery. It is not unusual to experience pain in the scar  from time to time during the interval.  If the pain becomes severe or the scar thickens, you should notify the office.    A slight amount of redness in a scar is expected for the first six months.  After six months, the redness will fade and the scar will soften and fade.  The color difference becomes less noticeable with time.  If there are any problems, return for a post-op surgery check at your earliest convenience.  To improve the appearance of the scar, you can use silicone scar gel, cream, or sheets (such as Mederma or Serica) every night for up to one year. These are available over the counter (without a prescription).  Please call our office at [phone] for any questions or concerns.   Due to recent changes in healthcare laws, you may see results of your pathology and/or laboratory studies on MyChart before the doctors have had a chance to review them. We understand that in some cases there may be results that are confusing or concerning to you. Please understand that not all results are received at the same time and often the doctors may need to interpret multiple results in order to provide you with the best plan of care or course of treatment. Therefore, we ask that you please give [place] 2 business days to thoroughly review all your results before contacting the office for clarification. Should we see  a critical lab result, you will be contacted sooner.   If You Need Anything After Your Visit  If you have any questions or concerns for your doctor, please call our main line at [phone] and press option 4 to reach your doctor's medical assistant. If no one answers, please leave a voicemail as directed and we will return your call as soon as possible. Messages left after 4 pm will be answered the following business day.   You may also send [place] a message via MyChart. We typically respond to MyChart messages within 1-2 business days.  For prescription refills, please ask your pharmacy to  contact our office. Our fax number is [phone].  If you have an urgent issue when the clinic is closed that cannot wait until the next business day, you can page your doctor at the number below.    Please note that while we do our best to be available for urgent issues outside of office hours, we are not available 24/7.   If you have an urgent issue and are unable to reach [place], you may choose to seek medical care at your doctor's office, retail clinic, urgent care center, or emergency room.  If you have a medical emergency, please immediately call 911 or go to the emergency department.  Pager Numbers  - Dr. Gwen Pounds: [phone]  - Dr. Roseanne Reno: [phone]  - Dr. Katrinka Blazing: [phone]   In the event of inclement weather, please call our main line at [phone] for an update on the status of any delays or closures.  Dermatology Medication Tips: Please keep the boxes that topical medications come in in order to help keep track of the instructions about where and how to use these. Pharmacies typically print the medication instructions only on the boxes and not directly on the medication tubes.   If your medication is too expensive, please contact our office at [phone] option 4 or send [place] a message through MyChart.   We are unable to tell what your co-pay for medications will be in advance as this is different depending on your insurance coverage. However, we may be able to find a substitute medication at lower cost or fill out paperwork to get insurance to cover a needed medication.   If a prior authorization is required to get your medication covered by your insurance company, please allow [place] 1-2 business days to complete this process.  Drug prices often vary depending on where the prescription is filled and some pharmacies may offer cheaper prices.  The website www.goodrx.com contains coupons for medications through different pharmacies. The prices here do not account for what  the cost may be with help from insurance (it may be cheaper with your insurance), but the website can give you the price if you did not use any insurance.  - You can print the associated coupon and take it with your prescription to the pharmacy.  - You may also stop by our office during regular business hours and pick up a GoodRx coupon card.  - If you need your prescription sent electronically to a different pharmacy, notify our office through [facility] or by phone at [phone] option 4.     Si Usted Necesita Algo Despus de Su Visita  Tambin puede enviarnos un mensaje a travs de Clinical cytogeneticist. Por lo general respondemos a los mensajes de MyChart en el transcurso de 1 a 2 das hbiles.  Para renovar recetas, por favor pida a su farmacia que se ponga en contacto  con nuestra oficina. Annie Sable de fax es [place] [phone].  Si tiene un asunto urgente cuando la clnica est cerrada y que no puede esperar hasta el siguiente da hbil, puede llamar/localizar a su doctor(a) al nmero que aparece a continuacin.   Por favor, tenga en cuenta que aunque hacemos todo lo posible para estar disponibles para asuntos urgentes fuera del horario de [place], no estamos disponibles las 24 horas del da, los 7 [address]  [address] de la [place].   Si tiene un problema urgente y no puede comunicarse con nosotros, puede optar por buscar atencin mdica  en el consultorio de su doctor(a), en una clnica privada, en un centro de atencin urgente o en una sala de emergencias.  Si tiene Engineer, drilling, por favor llame inmediatamente al 911 o vaya a la sala de emergencias.  Nmeros de bper  - Dr. Gwen Pounds: [phone]  - Dra. Roseanne Reno: [phone]  - Dr. Katrinka Blazing: [phone]   En caso de inclemencias del tiempo, por favor llame a Lacy Duverney principal al [phone] para una actualizacin sobre el [place] de cualquier retraso o cierre.  Consejos para la medicacin en dermatologa: Por favor, guarde las  cajas en las que vienen los medicamentos de uso tpico para ayudarle a seguir las instrucciones sobre dnde y cmo usarlos. Las farmacias generalmente imprimen las instrucciones del medicamento slo en las cajas y no directamente en los tubos del [place].   Si su medicamento es muy caro, por favor, pngase en contacto con Rolm Gala llamando al [phone] y presione la opcin 4 o envenos un mensaje a travs de Clinical cytogeneticist.   No podemos decirle cul ser su copago por los medicamentos por adelantado ya que esto es diferente dependiendo de la cobertura de su seguro. Sin embargo, es posible que podamos encontrar un medicamento sustituto a Audiological scientist un formulario para que el seguro cubra el medicamento que se considera necesario.   Si se requiere una autorizacin previa para que su compaa de seguros [place] su medicamento, por favor permtanos de 1 a 2 das hbiles para completar [address]  Los precios de los medicamentos varan con frecuencia dependiendo del Environmental consultant de dnde se surte la receta y alguna farmacias pueden ofrecer precios ms baratos.  El sitio web www.goodrx.com tiene cupones para medicamentos de Health and safety inspector. Los precios aqu no tienen en cuenta lo que podra costar con la ayuda del seguro (puede ser ms barato con su seguro), pero el sitio web puede darle el precio si no utiliz Tourist information centre manager.  - Puede imprimir el cupn correspondiente y llevarlo con su receta a la farmacia.  - Tambin puede pasar por nuestra oficina durante el horario de atencin regular y Education officer, museum una tarjeta de cupones de GoodRx.  - Si necesita que su receta se enve electrnicamente a una farmacia diferente, informe a nuestra oficina a travs de MyChart de [place] o por telfono llamando al [phone] y presione la opcin 4.

## 2023-07-08 NOTE — Progress Notes (Signed)
Follow-Up Visit   Subjective  Alex Smith is a 88 y.o. male who presents for the following: Bx proven BCC of the R cheek, patient here today for [facility].  The following portions of the chart were reviewed this encounter and updated as appropriate: medications, allergies, medical history  Review of Systems:  No other skin or systemic complaints except as noted in HPI or Assessment and Plan.  Objective  Well appearing patient in no apparent distress; mood and affect are within normal limits.  A focused examination was performed of the following areas: the face   Relevant exam findings are noted in the Assessment and Plan.  R cheek Pink bx site 1.6 cm  L sup forehead x 1, L preauricular x 1 (2) Pink scaly macules    Assessment & Plan     Basal cell carcinoma (BCC) of skin of other part of face R cheek  Destruction of lesion Complexity: extensive   Destruction method: electrodesiccation and curettage   Informed consent: discussed and consent obtained   Timeout:  patient name, date of birth, surgical site, and procedure verified Procedure prep:  Patient was prepped and draped in usual sterile fashion Prep type:  Isopropyl alcohol Anesthesia: the lesion was anesthetized in a standard fashion   Anesthetic:  1% lidocaine w/ epinephrine 1-100,000 buffered w/ 8.4% NaHCO3 Curettage performed in three different directions: Yes   Electrodesiccation performed over the curetted area: Yes   Lesion length (cm):  1.6 Lesion width (cm):  1.6 Margin per side (cm):  0.2 Final wound size (cm):  2 Hemostasis achieved with:  pressure, aluminum chloride and electrodesiccation Outcome: patient tolerated procedure well with no complications   Post-procedure details: sterile dressing applied and wound care instructions given   Dressing type: bandage and petrolatum    AK (actinic keratosis) (2) L sup forehead x 1, L preauricular x 1  Actinic keratoses are precancerous spots that appear  secondary to cumulative UV radiation exposure/sun exposure over time. They are chronic with expected duration over 1 year. A portion of actinic keratoses will progress to squamous cell carcinoma of the skin. It is not possible to reliably predict which spots will progress to skin cancer and so treatment is recommended to prevent development of skin cancer.  Recommend daily broad spectrum sunscreen SPF 30+ to sun-exposed areas, reapply every 2 hours as needed.  Recommend staying in the shade or wearing long sleeves, sun glasses (UVA+UVB protection) and wide brim hats (4-inch brim around the entire circumference of the hat). Call for new or changing lesions.  Destruction of lesion - L sup forehead x 1, L preauricular x 1 (2) Complexity: simple   Destruction method: cryotherapy   Informed consent: discussed and consent obtained   Timeout:  patient name, date of birth, surgical site, and procedure verified Lesion destroyed using liquid nitrogen: Yes   Region frozen until ice ball extended beyond lesion: Yes   Outcome: patient tolerated procedure well with no complications   Post-procedure details: wound care instructions given    ACTINIC DAMAGE - chronic, secondary to cumulative UV radiation exposure/sun exposure over time - diffuse scaly erythematous macules with underlying dyspigmentation - Recommend daily broad spectrum sunscreen SPF 30+ to sun-exposed areas, reapply every 2 hours as needed.  - Recommend staying in the shade or wearing long sleeves, sun glasses (UVA+UVB protection) and wide brim hats (4-inch brim around the entire circumference of the hat). - Call for new or changing lesions.  SEBORRHEIC KERATOSIS - Stuck-on, waxy,  tan-brown papules and/or plaques  - Benign-appearing - Discussed benign etiology and prognosis. - Observe - Call for any changes  Return for follow up in 6-8 mths.  Maylene Roes, CMA, am acting as scribe for Armida Sans, MD .  Documentation: I have  reviewed the above documentation for accuracy and completeness, and I agree with the above.  Armida Sans, MD

## 2023-07-09 ENCOUNTER — Inpatient Hospital Stay: Payer: PPO

## 2023-07-13 ENCOUNTER — Encounter: Payer: Self-pay | Admitting: Dermatology

## 2023-07-16 ENCOUNTER — Inpatient Hospital Stay: Payer: PPO

## 2023-07-23 ENCOUNTER — Inpatient Hospital Stay: Payer: PPO | Attending: Nurse Practitioner

## 2023-07-23 DIAGNOSIS — D46Z Other myelodysplastic syndromes: Secondary | ICD-10-CM | POA: Insufficient documentation

## 2023-07-23 DIAGNOSIS — Z5111 Encounter for antineoplastic chemotherapy: Secondary | ICD-10-CM | POA: Diagnosis present

## 2023-07-23 MED ORDER — LUSPATERCEPT-AAMT 75 MG ~~LOC~~ SOLR
100.0000 mg | SUBCUTANEOUS | Status: DC
Start: 2023-07-23 — End: 2023-07-23
  Administered 2023-07-23: 100 mg via SUBCUTANEOUS
  Filled 2023-07-23: qty 1.5

## 2023-07-30 ENCOUNTER — Other Ambulatory Visit: Payer: PPO

## 2023-07-30 ENCOUNTER — Inpatient Hospital Stay: Payer: PPO

## 2023-08-06 ENCOUNTER — Inpatient Hospital Stay: Payer: PPO

## 2023-08-06 ENCOUNTER — Other Ambulatory Visit: Payer: PPO

## 2023-08-13 ENCOUNTER — Other Ambulatory Visit: Payer: Self-pay

## 2023-08-13 ENCOUNTER — Emergency Department: Payer: PPO

## 2023-08-13 ENCOUNTER — Inpatient Hospital Stay
Admission: EM | Admit: 2023-08-13 | Discharge: 2023-08-15 | DRG: 871 | Disposition: A | Discharge status: Home Health | Payer: PPO | Source: Skilled Nursing Facility | Attending: Family Medicine | Admitting: Family Medicine

## 2023-08-13 DIAGNOSIS — A419 Sepsis, unspecified organism: Principal | ICD-10-CM | POA: Diagnosis present

## 2023-08-13 DIAGNOSIS — Z961 Presence of intraocular lens: Secondary | ICD-10-CM | POA: Diagnosis present

## 2023-08-13 DIAGNOSIS — Z794 Long term (current) use of insulin: Secondary | ICD-10-CM

## 2023-08-13 DIAGNOSIS — R1110 Vomiting, unspecified: Secondary | ICD-10-CM

## 2023-08-13 DIAGNOSIS — N400 Enlarged prostate without lower urinary tract symptoms: Secondary | ICD-10-CM | POA: Diagnosis present

## 2023-08-13 DIAGNOSIS — F0280 Dementia in other diseases classified elsewhere without behavioral disturbance: Secondary | ICD-10-CM | POA: Diagnosis present

## 2023-08-13 DIAGNOSIS — E1165 Type 2 diabetes mellitus with hyperglycemia: Secondary | ICD-10-CM | POA: Diagnosis present

## 2023-08-13 DIAGNOSIS — D6959 Other secondary thrombocytopenia: Secondary | ICD-10-CM | POA: Diagnosis present

## 2023-08-13 DIAGNOSIS — I2510 Atherosclerotic heart disease of native coronary artery without angina pectoris: Secondary | ICD-10-CM | POA: Diagnosis present

## 2023-08-13 DIAGNOSIS — E785 Hyperlipidemia, unspecified: Secondary | ICD-10-CM | POA: Diagnosis present

## 2023-08-13 DIAGNOSIS — K219 Gastro-esophageal reflux disease without esophagitis: Secondary | ICD-10-CM | POA: Diagnosis present

## 2023-08-13 DIAGNOSIS — Z833 Family history of diabetes mellitus: Secondary | ICD-10-CM | POA: Diagnosis not present

## 2023-08-13 DIAGNOSIS — G309 Alzheimer's disease, unspecified: Secondary | ICD-10-CM | POA: Diagnosis present

## 2023-08-13 DIAGNOSIS — I129 Hypertensive chronic kidney disease with stage 1 through stage 4 chronic kidney disease, or unspecified chronic kidney disease: Secondary | ICD-10-CM | POA: Diagnosis present

## 2023-08-13 DIAGNOSIS — Z86008 Personal history of in-situ neoplasm of other site: Secondary | ICD-10-CM

## 2023-08-13 DIAGNOSIS — E1122 Type 2 diabetes mellitus with diabetic chronic kidney disease: Secondary | ICD-10-CM | POA: Diagnosis present

## 2023-08-13 DIAGNOSIS — E872 Acidosis, unspecified: Secondary | ICD-10-CM | POA: Diagnosis present

## 2023-08-13 DIAGNOSIS — Z66 Do not resuscitate: Secondary | ICD-10-CM | POA: Diagnosis present

## 2023-08-13 DIAGNOSIS — Z8619 Personal history of other infectious and parasitic diseases: Secondary | ICD-10-CM

## 2023-08-13 DIAGNOSIS — Z87891 Personal history of nicotine dependence: Secondary | ICD-10-CM

## 2023-08-13 DIAGNOSIS — J449 Chronic obstructive pulmonary disease, unspecified: Secondary | ICD-10-CM | POA: Diagnosis present

## 2023-08-13 DIAGNOSIS — Z1152 Encounter for screening for COVID-19: Secondary | ICD-10-CM | POA: Diagnosis not present

## 2023-08-13 DIAGNOSIS — J189 Pneumonia, unspecified organism: Secondary | ICD-10-CM

## 2023-08-13 DIAGNOSIS — D469 Myelodysplastic syndrome, unspecified: Secondary | ICD-10-CM | POA: Diagnosis present

## 2023-08-13 DIAGNOSIS — Z85828 Personal history of other malignant neoplasm of skin: Secondary | ICD-10-CM | POA: Diagnosis not present

## 2023-08-13 DIAGNOSIS — Z8249 Family history of ischemic heart disease and other diseases of the circulatory system: Secondary | ICD-10-CM

## 2023-08-13 DIAGNOSIS — Z7982 Long term (current) use of aspirin: Secondary | ICD-10-CM

## 2023-08-13 DIAGNOSIS — J690 Pneumonitis due to inhalation of food and vomit: Secondary | ICD-10-CM | POA: Diagnosis present

## 2023-08-13 DIAGNOSIS — I4891 Unspecified atrial fibrillation: Secondary | ICD-10-CM | POA: Diagnosis present

## 2023-08-13 DIAGNOSIS — Z79899 Other long term (current) drug therapy: Secondary | ICD-10-CM

## 2023-08-13 DIAGNOSIS — N1831 Chronic kidney disease, stage 3a: Secondary | ICD-10-CM | POA: Diagnosis present

## 2023-08-13 DIAGNOSIS — Z856 Personal history of leukemia: Secondary | ICD-10-CM

## 2023-08-13 LAB — COMPREHENSIVE METABOLIC PANEL
ALT: 15 U/L (ref 0–44)
AST: 27 U/L (ref 15–41)
Albumin: 3.9 g/dL (ref 3.5–5.0)
Alkaline Phosphatase: 68 U/L (ref 38–126)
Anion gap: 16 — ABNORMAL HIGH (ref 5–15)
BUN: 29 mg/dL — ABNORMAL HIGH (ref 8–23)
CO2: 18 mmol/L — ABNORMAL LOW (ref 22–32)
Calcium: 8.5 mg/dL — ABNORMAL LOW (ref 8.9–10.3)
Chloride: 103 mmol/L (ref 98–111)
Creatinine, Ser: 1.77 mg/dL — ABNORMAL HIGH (ref 0.61–1.24)
GFR, Estimated: 37 mL/min — ABNORMAL LOW (ref >60)
Glucose, Bld: 273 mg/dL — ABNORMAL HIGH (ref 70–99)
Potassium: 3.4 mmol/L — ABNORMAL LOW (ref 3.5–5.1)
Sodium: 137 mmol/L (ref 135–145)
Total Bilirubin: 1 mg/dL (ref <1.2)
Total Protein: 8.1 g/dL (ref 6.5–8.1)

## 2023-08-13 LAB — CBG MONITORING, ED
Glucose-Capillary: 158 mg/dL — ABNORMAL HIGH (ref 70–99)
Glucose-Capillary: 135 mg/dL — ABNORMAL HIGH (ref 70–99)
Glucose-Capillary: 100 mg/dL — ABNORMAL HIGH (ref 70–99)

## 2023-08-13 LAB — CBC WITH DIFFERENTIAL/PLATELET
Abs Immature Granulocytes: 0.03 10*3/uL (ref 0.00–0.07)
Basophils Absolute: 0 10*3/uL (ref 0.0–0.1)
Basophils Relative: 0 %
Eosinophils Absolute: 0 10*3/uL (ref 0.0–0.5)
Eosinophils Relative: 0 %
HCT: 34.2 % — ABNORMAL LOW (ref 39.0–52.0)
Hemoglobin: 11.3 g/dL — ABNORMAL LOW (ref 13.0–17.0)
Immature Granulocytes: 1 %
Lymphocytes Relative: 6 %
Lymphs Abs: 0.3 10*3/uL — ABNORMAL LOW (ref 0.7–4.0)
MCH: 34.2 pg — ABNORMAL HIGH (ref 26.0–34.0)
MCHC: 33 g/dL (ref 30.0–36.0)
MCV: 103.6 fL — ABNORMAL HIGH (ref 80.0–100.0)
Monocytes Absolute: 0.2 10*3/uL (ref 0.1–1.0)
Monocytes Relative: 4 %
Neutro Abs: 4 10*3/uL (ref 1.7–7.7)
Neutrophils Relative %: 89 %
Platelets: 106 10*3/uL — ABNORMAL LOW (ref 150–400)
RBC: 3.3 MIL/uL — ABNORMAL LOW (ref 4.22–5.81)
RDW: 16.3 % — ABNORMAL HIGH (ref 11.5–15.5)
WBC: 4.5 10*3/uL (ref 4.0–10.5)
nRBC: 0.7 % — ABNORMAL HIGH (ref 0.0–0.2)

## 2023-08-13 LAB — RESP PANEL BY RT-PCR (RSV, FLU A&B, COVID)  RVPGX2
Influenza A by PCR: NEGATIVE
Influenza B by PCR: NEGATIVE
Resp Syncytial Virus by PCR: NEGATIVE
SARS Coronavirus 2 by RT PCR: NEGATIVE

## 2023-08-13 LAB — LIPASE, BLOOD: Lipase: 19 U/L (ref 11–51)

## 2023-08-13 LAB — PROCALCITONIN: Procalcitonin: 6.44 ng/mL

## 2023-08-13 LAB — LACTIC ACID, PLASMA
Lactic Acid, Venous: 5 mmol/L (ref 0.5–1.9)
Lactic Acid, Venous: 3.1 mmol/L (ref 0.5–1.9)
Lactic Acid, Venous: 1.7 mmol/L (ref 0.5–1.9)

## 2023-08-13 LAB — PROTIME-INR
INR: 1.1 (ref 0.8–1.2)
Prothrombin Time: 13.9 s (ref 11.4–15.2)

## 2023-08-13 LAB — BRAIN NATRIURETIC PEPTIDE: B Natriuretic Peptide: 174 pg/mL — ABNORMAL HIGH (ref 0.0–100.0)

## 2023-08-13 LAB — APTT: aPTT: 33 s (ref 24–36)

## 2023-08-13 LAB — TROPONIN I (HIGH SENSITIVITY)
Troponin I (High Sensitivity): 18 ng/L — ABNORMAL HIGH (ref <18)
Troponin I (High Sensitivity): 28 ng/L — ABNORMAL HIGH (ref <18)

## 2023-08-13 MED ORDER — ONDANSETRON HCL 4 MG/2ML IJ SOLN: 4.0000 mg | Freq: Four times a day (QID) | INTRAMUSCULAR | Status: DC | PRN

## 2023-08-13 MED ORDER — INSULIN ASPART 100 UNIT/ML IJ SOLN
0.0000 [IU] | Freq: Three times a day (TID) | INTRAMUSCULAR | Status: DC
  Administered 2023-08-13: 1 [IU] via SUBCUTANEOUS
  Administered 2023-08-15: 2 [IU] via SUBCUTANEOUS
  Filled 2023-08-13 (×2): qty 1

## 2023-08-13 MED ORDER — SODIUM CHLORIDE 0.9 % IV SOLN: INTRAVENOUS | Status: AC

## 2023-08-13 MED ORDER — TORSEMIDE 20 MG PO TABS
20.0000 mg | ORAL_TABLET | Freq: Every day | ORAL | Status: DC
Start: 2023-08-14 — End: 2023-08-15
  Administered 2023-08-14 – 2023-08-15 (×2): 20 mg via ORAL
  Filled 2023-08-13 (×2): qty 1

## 2023-08-13 MED ORDER — ONDANSETRON HCL 4 MG/2ML IJ SOLN
4.0000 mg | Freq: Once | INTRAMUSCULAR | Status: AC
  Administered 2023-08-13: 4 mg via INTRAVENOUS
  Filled 2023-08-13: qty 2

## 2023-08-13 MED ORDER — VANCOMYCIN HCL IN DEXTROSE 1-5 GM/200ML-% IV SOLN
1000.0000 mg | Freq: Once | INTRAVENOUS | Status: AC
  Administered 2023-08-13: 1000 mg via INTRAVENOUS
  Filled 2023-08-13: qty 200

## 2023-08-13 MED ORDER — SODIUM CHLORIDE 0.9 % IV SOLN
2.0000 g | Freq: Once | INTRAVENOUS | Status: AC
Start: 2023-08-13 — End: 2023-08-13
  Administered 2023-08-13: 2 g via INTRAVENOUS
  Filled 2023-08-13: qty 12.5

## 2023-08-13 MED ORDER — LACTATED RINGERS IV BOLUS
700.0000 mL | Freq: Once | INTRAVENOUS | Status: AC
Start: 2023-08-13 — End: 2023-08-13
  Administered 2023-08-13: 700 mL via INTRAVENOUS

## 2023-08-13 MED ORDER — TRAZODONE HCL 50 MG PO TABS
25.0000 mg | ORAL_TABLET | Freq: Every day | ORAL | Status: DC
  Administered 2023-08-13 – 2023-08-14 (×2): 25 mg via ORAL
  Filled 2023-08-13 (×2): qty 1

## 2023-08-13 MED ORDER — ONDANSETRON HCL 4 MG PO TABS: 4.0000 mg | ORAL_TABLET | Freq: Four times a day (QID) | ORAL | Status: DC | PRN

## 2023-08-13 MED ORDER — SERTRALINE HCL 50 MG PO TABS
25.0000 mg | ORAL_TABLET | Freq: Every day | ORAL | Status: DC
  Administered 2023-08-13 – 2023-08-15 (×3): 25 mg via ORAL
  Filled 2023-08-13 (×3): qty 1

## 2023-08-13 MED ORDER — ACETAMINOPHEN 500 MG PO TABS
1000.0000 mg | ORAL_TABLET | Freq: Once | ORAL | Status: AC
  Administered 2023-08-13: 1000 mg via ORAL
  Filled 2023-08-13: qty 2

## 2023-08-13 MED ORDER — METRONIDAZOLE 500 MG/100ML IV SOLN
500.0000 mg | Freq: Once | INTRAVENOUS | Status: AC
  Administered 2023-08-13: 500 mg via INTRAVENOUS
  Filled 2023-08-13: qty 100

## 2023-08-13 MED ORDER — AMLODIPINE BESYLATE 10 MG PO TABS
10.0000 mg | ORAL_TABLET | Freq: Every day | ORAL | Status: DC
  Administered 2023-08-14 – 2023-08-15 (×2): 10 mg via ORAL
  Filled 2023-08-13: qty 1
  Filled 2023-08-13: qty 2

## 2023-08-13 MED ORDER — LOSARTAN POTASSIUM 50 MG PO TABS: 25.0000 mg | ORAL_TABLET | Freq: Every day | ORAL | Status: DC

## 2023-08-13 MED ORDER — ASPIRIN 81 MG PO TBEC
81.0000 mg | DELAYED_RELEASE_TABLET | Freq: Every day | ORAL | Status: DC
  Administered 2023-08-13 – 2023-08-15 (×3): 81 mg via ORAL
  Filled 2023-08-13 (×3): qty 1

## 2023-08-13 MED ORDER — DONEPEZIL HCL 5 MG PO TABS
5.0000 mg | ORAL_TABLET | Freq: Every day | ORAL | Status: DC
  Administered 2023-08-13 – 2023-08-15 (×3): 5 mg via ORAL
  Filled 2023-08-13 (×4): qty 1

## 2023-08-13 MED ORDER — SODIUM CHLORIDE 0.9 % IV SOLN
2.0000 g | Freq: Once | INTRAVENOUS | Status: AC
  Administered 2023-08-14: 2 g via INTRAVENOUS
  Filled 2023-08-13: qty 12.5

## 2023-08-13 MED ORDER — ENOXAPARIN SODIUM 40 MG/0.4ML IJ SOSY
40.0000 mg | PREFILLED_SYRINGE | INTRAMUSCULAR | Status: DC
  Administered 2023-08-13 – 2023-08-15 (×3): 40 mg via SUBCUTANEOUS
  Filled 2023-08-13 (×3): qty 0.4

## 2023-08-13 MED ORDER — INSULIN GLARGINE-YFGN 100 UNIT/ML ~~LOC~~ SOLN
15.0000 [IU] | Freq: Every day | SUBCUTANEOUS | Status: DC
  Administered 2023-08-13 – 2023-08-14 (×2): 15 [IU] via SUBCUTANEOUS
  Filled 2023-08-13 (×3): qty 0.15

## 2023-08-13 MED ORDER — AMLODIPINE BESYLATE 5 MG PO TABS: 10.0000 mg | ORAL_TABLET | Freq: Every day | ORAL | Status: DC

## 2023-08-13 MED ORDER — LACTATED RINGERS IV BOLUS
1000.0000 mL | Freq: Once | INTRAVENOUS | Status: AC
  Administered 2023-08-13: 1000 mL via INTRAVENOUS

## 2023-08-13 MED ORDER — LACTATED RINGERS IV BOLUS (SEPSIS)
1000.0000 mL | Freq: Once | INTRAVENOUS | Status: AC
  Administered 2023-08-13: 1000 mL via INTRAVENOUS

## 2023-08-13 MED ORDER — LOSARTAN POTASSIUM 25 MG PO TABS
25.0000 mg | ORAL_TABLET | Freq: Every day | ORAL | Status: DC
  Administered 2023-08-14 – 2023-08-15 (×2): 25 mg via ORAL
  Filled 2023-08-13 (×2): qty 1

## 2023-08-13 MED ORDER — IOHEXOL 300 MG/ML  SOLN
75.0000 mL | Freq: Once | INTRAMUSCULAR | Status: AC | PRN
  Administered 2023-08-13: 75 mL via INTRAVENOUS

## 2023-08-13 MED ORDER — PRAVASTATIN SODIUM 20 MG PO TABS
20.0000 mg | ORAL_TABLET | Freq: Every day | ORAL | Status: DC
  Administered 2023-08-13 – 2023-08-15 (×3): 20 mg via ORAL
  Filled 2023-08-13 (×3): qty 1

## 2023-08-13 MED ORDER — FAMOTIDINE 20 MG PO TABS
20.0000 mg | ORAL_TABLET | Freq: Every day | ORAL | Status: DC
  Administered 2023-08-13 – 2023-08-15 (×3): 20 mg via ORAL
  Filled 2023-08-13 (×3): qty 1

## 2023-08-13 MED ORDER — ACETAMINOPHEN 500 MG PO TABS
1000.0000 mg | ORAL_TABLET | Freq: Two times a day (BID) | ORAL | Status: DC
  Administered 2023-08-13 – 2023-08-14 (×3): 1000 mg via ORAL
  Filled 2023-08-13 (×3): qty 2

## 2023-08-13 MED ORDER — MELATONIN 5 MG PO TABS
10.0000 mg | ORAL_TABLET | Freq: Every day | ORAL | Status: DC
  Administered 2023-08-13 – 2023-08-14 (×2): 10 mg via ORAL
  Filled 2023-08-13 (×2): qty 2

## 2023-08-13 MED ORDER — LOPERAMIDE HCL 2 MG PO CAPS: 2.0000 mg | ORAL_CAPSULE | ORAL | Status: DC | PRN

## 2023-08-13 MED ORDER — TAMSULOSIN HCL 0.4 MG PO CAPS
0.4000 mg | ORAL_CAPSULE | Freq: Every day | ORAL | Status: DC
  Administered 2023-08-13 – 2023-08-15 (×3): 0.4 mg via ORAL
  Filled 2023-08-13 (×3): qty 1

## 2023-08-13 MED ORDER — VANCOMYCIN HCL 750 MG/150ML IV SOLN
750.0000 mg | INTRAVENOUS | Status: DC
  Administered 2023-08-13 – 2023-08-14 (×2): 750 mg via INTRAVENOUS
  Filled 2023-08-13 (×3): qty 150

## 2023-08-13 MED ORDER — PANTOPRAZOLE SODIUM 40 MG PO TBEC
40.0000 mg | DELAYED_RELEASE_TABLET | Freq: Two times a day (BID) | ORAL | Status: DC
  Administered 2023-08-13 – 2023-08-15 (×5): 40 mg via ORAL
  Filled 2023-08-13 (×5): qty 1

## 2023-08-13 NOTE — ED Notes (Signed)
Patient was incontinent to a large BM, mostly liquid with some formed elements, color is brown. Patient was cleaned up by two staff members. Patient was cooperative and independent with turning.

## 2023-08-13 NOTE — ED Provider Notes (Signed)
[facility] Provider Note    Event Date/Time   First MD Initiated Contact with Patient 08/13/23 [phone]     (approximate)   History   Emesis   HPI  Alex Smith is a 88 y.o. male with history of dementia, hypertension, hyperlipidemia, insulin-dependent diabetes, chronic kidney disease, a fib who presents to the emergency department from his nursing home with EMS for concerns for 1 episode of vomiting.  Here he is tachypneic, tachycardic and has an oral temperature of 100.3.  He denies any pain.  EMS reports no diarrhea.   History provided by patient, EMS.    Past Medical History:  Diagnosis Date   Actinic keratosis    Alzheimer's dementia (HCC) 08/18/2021   Anemia    B12 deficiency    Basal cell carcinoma 03/30/2018   left ant nasal ala   Basal cell carcinoma 06/25/2023   R cheek, schedule for [facility]   Basal cell carcinoma of face 01/19/2013   right distal dorsum nose   BPH (benign prostatic hypertrophy)    followed by urology, discharged (Dr. 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 pretibial. 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 EDC 09/07/2020   Squamous cell carcinoma of skin 04/03/2020   Left cheek. WD SCC   Systolic murmur 05/28/2016   Type 2 diabetes, controlled, with retinopathy (HCC)     Past Surgical History:  Procedure Laterality Date   25 GAUGE PARS PLANA VITRECTOMY WITH  20 GAUGE MVR PORT FOR MACULAR HOLE  07/07/2012   Procedure: 25 GAUGE PARS PLANA VITRECTOMY WITH 20 GAUGE MVR PORT FOR MACULAR HOLE;  Surgeon: Sherrie George, MD;  Location: [facility] OR;  Service: Ophthalmology;  Laterality: Left;   BONE MARROW BIOPSY  2016   CARDIOVASCULAR STRESS TEST  2013   treadmill - no evidence ischemia, EF 61%   CATARACT EXTRACTION W/ INTRAOCULAR LENS  IMPLANT, BILATERAL  ~ 2010   COLONOSCOPY  2014   Elliot WNL no rpt needed, h/o polyps   EYE SURGERY Left 06/2012   laser surgery   GAS INSERTION  07/07/2012   Procedure: INSERTION OF GAS;  Surgeon: Sherrie George, MD;  Location: [facility] OR;  Service: Ophthalmology;  Laterality: Left;  [id number]   PERIPHERAL VASCULAR CATHETERIZATION N/A 02/23/2015   Procedure: Shelda Pal Cath Insertion;  Surgeon: Annice Needy, MD;  Location: [facility] INVASIVE CV LAB;  Service: Cardiovascular;  Laterality: N/A;   PORTA CATH REMOVAL N/A 02/16/2020   Procedure: PORTA CATH REMOVAL;  Surgeon: Annice Needy, MD;  Location: [facility] INVASIVE CV LAB;  Service: Cardiovascular;  Laterality: N/A;   SERUM PATCH  07/07/2012   Procedure: SERUM PATCH;  Surgeon: Sherrie George, MD;  Location: [facility] OR;  Service: Ophthalmology;  Laterality: Left;   SKIN CANCER EXCISION     "all over my face" (  07/07/2012)    MEDICATIONS:  Prior to Admission medications   Medication Sig Start Date End Date Taking? Authorizing Provider  acetaminophen (TYLENOL) 500 MG tablet Take 2 tablets (1,000 mg total) by mouth 2 (two) times daily. 11/27/22   Alford Highland, MD  albuterol (VENTOLIN HFA) 108 (90 Base) MCG/ACT inhaler Inhale 2 puffs into the lungs every 6 (six) hours as needed for wheezing or shortness of breath. 04/20/22   Lynn Ito, MD  alum & mag hydroxide-simeth (MAALOX/MYLANTA) 200-200-20 MG/5ML suspension Take 30 mLs by mouth every 4 (four) hours as needed for indigestion or heartburn. Patient not taking: Reported on 02/27/2023 04/20/22   Lynn Ito, MD  amLODipine (NORVASC) 10 MG tablet Take 1  tablet (10 mg total) by mouth daily. 04/20/22   Lynn Ito, MD  ARICEPT 5 MG tablet Take 5 mg by mouth daily. Patient not taking: Reported on 02/27/2023 11/08/22   [provider]  aspirin EC 81 MG tablet Take 81 mg by mouth daily.    [provider]  clindamycin (CLEOCIN) 300 MG capsule Take 300 mg by mouth 4 (four) times daily. Patient not taking: Reported on 04/23/2023    [provider]  donepezil (ARICEPT ODT) 5 MG disintegrating tablet Take 5 mg by mouth at bedtime.    [provider]  famotidine (PEPCID) 20 MG tablet Take 20 mg by mouth daily.    [provider]  insulin glargine (LANTUS) 100 UNIT/ML Solostar Pen Inject 6 Units into the skin at bedtime. 11/27/22   Wieting, Richard, MD  insulin lispro (HUMALOG) 100 UNIT/ML KwikPen Inject 2 Units into the skin 3 (three) times daily before meals. Per sliding scale 11/27/22   Alford Highland, MD  loperamide (IMODIUM A-D) 2 MG tablet Take 2 mg by mouth 4 (four) times daily as needed for diarrhea or loose stools.    [provider]  losartan (COZAAR) 25 MG tablet Take 1 tablet (25 mg total) by mouth daily. 11/27/22 11/27/23  Alford Highland, MD  magnesium hydroxide (MILK OF MAGNESIA) 400 MG/5ML suspension Take 5 mLs by mouth daily as needed for mild constipation.    [provider]  melatonin 5 MG TABS Take 5 mg by mouth at bedtime. Patient not taking: Reported on 02/27/2023    [provider]  Multiple Vitamin (MULTIVITAMIN WITH MINERALS) TABS tablet Take 1 tablet by mouth daily. 08/23/21   Lanae Boast, MD  pantoprazole (PROTONIX) 40 MG tablet Take 1 tablet (40 mg total) by mouth 2 (two) times daily. 04/20/22   Lynn Ito, MD  pravastatin (PRAVACHOL) 20 MG tablet Take 20 mg by mouth daily.    [provider]  QUEtiapine (SEROQUEL) 25 MG tablet Take 0.5 tablets (12.5 mg total) by mouth at bedtime as needed (insomnia, agitation). Patient not taking: Reported on 04/23/2023 11/27/22    Alford Highland, MD  sertraline (ZOLOFT) 25 MG tablet Take 25 mg by mouth daily.    [provider]  tamsulosin (FLOMAX) 0.4 MG CAPS capsule TAKE 1 CAPSULE BY MOUTH EVERY DAY 12/11/17   Eustaquio Boyden, MD  torsemide (DEMADEX) 20 MG tablet Take 20 mg by mouth daily.    [provider]  vitamin B-12 (CYANOCOBALAMIN) 1000 MCG tablet Take 1 tablet (1,000 mcg total) by mouth daily. 06/01/19   Eustaquio Boyden, MD  vitamin E 400 UNIT capsule Take 400 Units by mouth daily.    [provider]    Physical Exam   Triage Vital Signs: ED Triage Vitals  Encounter Vitals  Group     BP 08/13/23 0558 (!) 147/89     Systolic BP Percentile --      Diastolic BP Percentile --      Pulse Rate 08/13/23 0558 (!) 103     Resp 08/13/23 0558 (!) 38     Temp 08/13/23 0558 100.3 F (37.9 C)     Temp Source 08/13/23 0558 Oral     SpO2 08/13/23 0551 94 %     Weight --      Height --      Head Circumference --      Peak Flow --      Pain Score --      Pain Loc --      Pain Education --      Exclude from Growth Chart --     Most recent vital signs: Vitals:   08/13/23 0551 08/13/23 0558  BP:  (!) 147/89  Pulse:  (!) 103  Resp:  (!) 38  Temp:  100.3 F (37.9 C)  SpO2: 94% 93%    CONSTITUTIONAL: Alert, elderly, nontoxic, pleasantly demented HEAD: Normocephalic, atraumatic EYES: Conjunctivae clear, pupils appear equal, sclera nonicteric ENT: normal nose; moist mucous membranes NECK: Supple, normal ROM CARD: irregular and tachycardic; S1 and S2 appreciated RESP: Tachypneic breath sounds clear and equal bilaterally; no wheezes, no rhonchi, no rales, no hypoxia or respiratory distress, speaking full sentences ABD/GI: Non-distended; soft, non-tender, no rebound, no guarding, no peritoneal signs BACK: The back appears normal EXT: Normal ROM in all joints; no deformity noted, no edema, no calf tenderness or calf swelling SKIN: Normal color for age and race; warm; no rash on  exposed skin NEURO: Moves all extremities equally, normal speech no facial asymmetry PSYCH: The patient's mood and manner are appropriate.   ED Results / Procedures / Treatments   LABS: (all labs ordered are listed, but only abnormal results are displayed) Labs Reviewed  CBC WITH DIFFERENTIAL/PLATELET - Abnormal; Notable for the following components:      Result Value   RBC 3.30 (*)    Hemoglobin 11.3 (*)    HCT 34.2 (*)    MCV 103.6 (*)    MCH 34.2 (*)    RDW 16.3 (*)    Platelets 106 (*)    nRBC 0.7 (*)    Lymphs Abs 0.3 (*)    All other components within normal limits  RESP PANEL BY RT-PCR (RSV, FLU A&B, COVID)  [id number]  CULTURE, BLOOD (ROUTINE X 2)  CULTURE, BLOOD (ROUTINE X 2)  LACTIC ACID, PLASMA  LACTIC ACID, PLASMA  COMPREHENSIVE METABOLIC PANEL  BRAIN NATRIURETIC PEPTIDE  LIPASE, BLOOD  URINALYSIS, W/ REFLEX TO CULTURE (INFECTION SUSPECTED)  PROCALCITONIN  PROTIME-INR  APTT  TROPONIN I (HIGH SENSITIVITY)     EKG:  EKG Interpretation Date/Time:  Wednesday August 13 2023 06:52:50 EST Ventricular Rate:  97 PR Interval:    QRS Duration:  99 QT Interval:  455 QTC Calculation: 573 R Axis:   105  Text Interpretation: Atrial fibrillation Right axis deviation Probable anteroseptal infarct, old Prolonged QT interval Artifact in lead(s) I II III aVR aVL aVF V1 V2 Confirmed by Rochele Raring [phone]) on 08/13/2023 6:56:28 AM         RADIOLOGY: My personal review and interpretation of imaging: Chest x-ray shows bronchopneumonia.  I have personally reviewed all radiology reports.   DG Chest Port 1 View Result Date: 08/13/2023 CLINICAL DATA:  88 year old male with possible sepsis.  Vomiting. EXAM: PORTABLE CHEST 1 VIEW  COMPARISON:  Chest radiographs 11/18/2022 and earlier. FINDINGS: Portable AP upright view at 0633 hours. Mildly lordotic positioning. Similar lung volumes. Mediastinal contours are stable and within normal limits. Visualized tracheal air column is  within normal limits. No pneumothorax. No pleural effusion or consolidation. But there is asymmetric reticulonodular peribronchial opacity in the right mid lung. Mild lung base atelectasis and/or scarring not significantly changed. No other acute lung finding. Negative visible bowel gas. No acute osseous abnormality identified. IMPRESSION: 1. New asymmetric reticulonodular peribronchial opacity in the right mid lung. Consider developing bronchopneumonia or mild aspiration in this setting. 2. Stable appearance of lung base atelectasis and/or scarring. No consolidation or pleural effusion. Electronically Signed   By: Odessa Fleming M.D.   On: 08/13/2023 06:43     PROCEDURES:  Critical Care performed: Yes, see critical care procedure note(s)   CRITICAL CARE Performed by: Baxter Hire Myeasha Ballowe   Total critical care time: 40 minutes  Critical care time was exclusive of separately billable procedures and treating other patients.  Critical care was necessary to treat or prevent imminent or life-threatening deterioration.  Critical care was time spent personally by me on the following activities: development of treatment plan with patient and/or surrogate as well as nursing, discussions with consultants, evaluation of patient's response to treatment, examination of patient, obtaining history from patient or surrogate, ordering and performing treatments and interventions, ordering and review of laboratory studies, ordering and review of radiographic studies, pulse oximetry and re-evaluation of patient's condition.   Marland Kitchen1-3 Lead EKG Interpretation  Performed by: Marlia Schewe, Layla Maw, DO Authorized by: Katrina Daddona, Layla Maw, DO     Interpretation: abnormal     ECG rate:  103   ECG rate assessment: tachycardic     Rhythm: sinus tachycardia     Ectopy: none     Conduction: normal       IMPRESSION / MDM / ASSESSMENT AND PLAN / ED COURSE  I reviewed the triage vital signs and the nursing notes.    Patient here with  temperature of 100.3, tachycardia, tachypnea, vomiting.  The patient is on the cardiac monitor to evaluate for evidence of arrhythmia and/or significant heart rate changes.   DIFFERENTIAL DIAGNOSIS (includes but not limited to):   Sepsis, UTI, bacteremia, pneumonia, viral URI, viral gastroenteritis, colitis, diverticulitis, appendicitis, bowel obstruction   Patient's presentation is most consistent with acute presentation with potential threat to life or bodily function.   PLAN: Will initiate septic workup given vital sign abnormalities.  Will obtain CT of the chest, abdomen and pelvis.  Will give IV fluids, broad-spectrum antibiotics.  Will give Tylenol for fever.   MEDICATIONS GIVEN IN ED: Medications  lactated ringers bolus 1,000 mL (1,000 mLs Intravenous New Bag/Given 08/13/23 0626)  metroNIDAZOLE (FLAGYL) IVPB 500 mg (has no administration in time range)  vancomycin (VANCOCIN) IVPB 1000 mg/200 mL premix (has no administration in time range)  ceFEPIme (MAXIPIME) 2 g in sodium chloride 0.9 % 100 mL IVPB (2 g Intravenous New Bag/Given 08/13/23 0616)  ondansetron (ZOFRAN) injection 4 mg (4 mg Intravenous Given 08/13/23 0619)  acetaminophen (TYLENOL) tablet 1,000 mg (1,000 mg Oral Given 08/13/23 6045)     ED COURSE: Patient's labs show no leukocytosis.  Hemoglobin of 11.3.  Additional lab work pending.  Chest x-ray reviewed and interpreted by myself and the radiologist and shows right middle lobe bronchopneumonia.  This would explain his fever, tachypnea.  He is getting broad-spectrum antibiotics.  No hypoxia.  Signed out to oncoming ED physician.  Patient  will need admission for sepsis.   CONSULTS: Patient will need admission for sepsis from pneumonia.   OUTSIDE RECORDS REVIEWED: Reviewed last nephrology note on 03/18/2023.       FINAL CLINICAL IMPRESSION(S) / ED DIAGNOSES   Final diagnoses:  Acute sepsis (HCC)  Vomiting in adult patient  Pneumonia of right middle lobe due  to infectious organism     Rx / DC Orders   ED Discharge Orders     None        Note:  This document was prepared using Dragon voice recognition software and may include unintentional dictation errors.   Apolonia Ellwood, Layla Maw, DO 08/13/23 [phone]

## 2023-08-13 NOTE — H&P (Signed)
History and Physical    Alex Smith UXL:[id number] DOB: Feb 17, 1935 DOA: 08/13/2023  PCP: Almetta Lovely, [facility] (Confirm with patient/family/NH records and if not entered, this has to be entered at [facility] point of entry) Patient coming from: SNF  I have personally briefly reviewed patient's old medical records in [facility]  Chief Complaint: Nausea, vomiting, and diarrhea  HPI: Alex Smith is a 88 y.o. male with medical history significant of Alzheimer's dementia, HTN, BPH, CKD stage IIIa, IDDM, sent from nursing home for evaluation of new onset of nauseous vomiting and diarrhea.  Patient started to feel nausea and vomited several times of stomach content last night and 3 loose bowel movement denied any abdominal pain has had subjective fever but no chills.  Denies any urinary problem, no chest pain no headaches. ED Course: Temperature 100.3, heart rate 103, blood pressure 140/80, O2 saturation 93% on room air. Blood work showed WBC 4.5, lactic acid 5.0, procalcitonin 6.4, K3.4, bicarb 18, creatinine 1.7 compared to baseline 1.5-2.1, glucose 273.  CT abdomen pelvis and chest with contrast showed bilateral lower lobe infiltrates but no consolidations.  Patient was started on vancomycin cefepime and Flagyl x 1 in the ED and a total of 2.7 L of IV bolus given in the ED  Review of Systems: As per HPI otherwise 14 point review of systems negative.    Past Medical History:  Diagnosis Date   Actinic keratosis    Alzheimer's dementia (HCC) 08/18/2021   Anemia    B12 deficiency    Basal cell carcinoma 03/30/2018   left ant nasal ala   Basal cell carcinoma 06/25/2023   R cheek, schedule for [facility]   Basal cell carcinoma of face 01/19/2013   right distal dorsum nose   BPH (benign prostatic hypertrophy)    followed by urology, discharged (Dr. 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 pretibial. 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 EDC 09/07/2020   Squamous cell carcinoma of skin 04/03/2020   Left cheek. [facility] SCC   Systolic murmur 05/28/2016   Type 2 diabetes, controlled, with retinopathy (HCC)     Past Surgical History:  Procedure Laterality Date   25 GAUGE PARS PLANA VITRECTOMY WITH 20 GAUGE MVR PORT FOR MACULAR HOLE  07/07/2012   Procedure: 25 GAUGE PARS PLANA VITRECTOMY WITH 20 GAUGE MVR PORT FOR MACULAR HOLE;  Surgeon: Sherrie George, MD;  Location: [facility] OR;  Service: Ophthalmology;  Laterality: Left;   BONE MARROW BIOPSY  2016   CARDIOVASCULAR STRESS TEST  2013   treadmill - no evidence ischemia, EF 61%   CATARACT EXTRACTION W/ INTRAOCULAR LENS  IMPLANT, BILATERAL  ~ 2010   COLONOSCOPY  2014   Elliot WNL no rpt needed, h/o polyps   EYE SURGERY Left 06/2012   laser surgery   GAS INSERTION  07/07/2012   Procedure: INSERTION OF GAS;  Surgeon: Sherrie George, MD;  Location: [facility] OR;  Service: Ophthalmology;  Laterality: Left;  C3F8   PERIPHERAL VASCULAR CATHETERIZATION N/A 02/23/2015   Procedure: Shelda Pal Cath Insertion;  Surgeon: Annice Needy, MD;  Location: [facility] INVASIVE CV LAB;  Service: Cardiovascular;  Laterality: N/A;   PORTA CATH REMOVAL N/A 02/16/2020   Procedure: PORTA CATH REMOVAL;  Surgeon: Annice Needy, MD;  Location: [facility] INVASIVE CV LAB;  Service: Cardiovascular;  Laterality: N/A;   SERUM PATCH  07/07/2012   Procedure: SERUM PATCH;  Surgeon: Sherrie George, MD;  Location: [facility] OR;  Service:  Ophthalmology;  Laterality: Left;   SKIN CANCER EXCISION     "all over my face" (07/07/2012)     reports that he quit smoking about 54 years ago. His smoking use included cigarettes. He started smoking about 79 years ago. He has a 50 pack-year smoking history. He has never used smokeless tobacco. He reports that he does not currently use alcohol. He reports that he does not use drugs.  Allergies  Allergen Reactions   Rituximab Rash    Chest tightness Chest tightness   Blood-Group Specific Substance Other (See Comments)    Had a post transfusion reaction of red blood cells; NOW REQUIRES WASHED BLOOD CELLS Had a post transfusion reaction of red blood cells; NOW REQUIRES WASHED BLOOD CELLS   Primaxin [Imipenem] Other (See Comments)    Possible allergy Tolerates cephalosporins   Voriconazole Other (See Comments)   Sulfa Antibiotics Itching and Rash   Sulfacetamide Sodium Itching and Rash    Family History  Problem Relation Age of Onset   Dementia Mother    Heart failure Father 62   Cancer Sister        breast   Diabetes Paternal Uncle    Diabetes Paternal Aunt    CAD Brother 68       MI   Stroke Neg Hx      Prior to Admission medications   Medication Sig Start Date End Date Taking? Authorizing Provider  acetaminophen (TYLENOL) 500 MG tablet Take 2 tablets (1,000 mg total) by mouth 2 (two) times daily. 11/27/22  Yes Wieting, Richard, MD  albuterol (VENTOLIN HFA) 108 (90 Base) MCG/ACT inhaler Inhale 2 puffs into the lungs every 6 (six) hours as needed for wheezing or shortness of breath. 04/20/22  Yes Lynn Ito, MD  alum & mag hydroxide-simeth (MAALOX/MYLANTA) 200-200-20 MG/5ML suspension Take 30 mLs by mouth every 4 (four) hours as needed for indigestion or heartburn. 04/20/22  Yes Lynn Ito, MD  amLODipine (NORVASC) 10 MG tablet Take 1 tablet (10 mg total) by mouth daily. 04/20/22  Yes Lynn Ito, MD  aspirin EC 81 MG tablet Take 81 mg by mouth daily.   Yes [provider]  donepezil (ARICEPT ODT) 5 MG disintegrating tablet Take 5 mg by mouth daily.   Yes [provider]  famotidine (PEPCID) 20 MG tablet Take 20 mg by mouth daily.   Yes [provider]  insulin glargine (LANTUS) 100 UNIT/ML Solostar Pen Inject 6 Units into the skin at bedtime. Patient taking differently: Inject 14 Units into the skin at bedtime. 11/27/22  Yes Wieting, Richard, MD  insulin lispro (HUMALOG) 100 UNIT/ML KwikPen Inject into the skin. SLIDING SCALE 11/27/22  Yes [provider]  loperamide (IMODIUM A-D) 2 MG tablet Take 2 mg by mouth 4 (four) times daily as needed for diarrhea or loose stools.   Yes [provider]  losartan (COZAAR) 25 MG tablet Take 1 tablet (25 mg total) by mouth daily. 11/27/22 11/27/23 Yes Wieting, Richard, MD  magnesium hydroxide (MILK OF MAGNESIA) 400 MG/5ML suspension Take 5 mLs by mouth daily as needed for mild constipation.   Yes [provider]  melatonin 5 MG TABS Take 10 mg by mouth at bedtime.   Yes [provider]  Multiple Vitamin (MULTIVITAMIN WITH MINERALS) TABS tablet Take 1 tablet by mouth daily. 08/23/21  Yes Lanae Boast, MD  pantoprazole (PROTONIX) 40 MG tablet Take 1 tablet (40 mg total) by mouth 2 (two) times daily. 04/20/22  Yes Lynn Ito, MD  pravastatin (PRAVACHOL) 20 MG tablet Take 20 mg by mouth daily.   Yes [provider]  sertraline (ZOLOFT) 25 MG tablet Take 25 mg by mouth daily.   Yes [provider]  tamsulosin (FLOMAX) 0.4 MG CAPS capsule TAKE 1 CAPSULE BY MOUTH EVERY DAY 12/11/17  Yes Eustaquio Boyden, MD  torsemide (DEMADEX) 20 MG tablet Take 20 mg by mouth daily.   Yes [provider]  traZODone (DESYREL) 50 MG tablet Take 25 mg by mouth at bedtime. 08/09/23  Yes [provider]  vitamin B-12 (CYANOCOBALAMIN) 1000 MCG tablet Take 1 tablet (1,000 mcg total) by mouth daily. 06/01/19  Yes Eustaquio Boyden, MD  vitamin E 400 UNIT capsule  Take 400 Units by mouth daily.   Yes [provider]  ARICEPT 5 MG tablet Take 5 mg by mouth daily. Patient not taking: Reported on 02/27/2023 11/08/22   [provider]  clindamycin (CLEOCIN) 300 MG capsule Take 300 mg by mouth 4 (four) times daily. Patient not taking: Reported on 04/23/2023    [provider]  insulin lispro (HUMALOG) 100 UNIT/ML KwikPen Inject 2 Units into the skin 3 (three) times daily before meals. Per sliding scale Patient not taking: Reported on 08/13/2023 11/27/22   Alford Highland, MD  QUEtiapine (SEROQUEL) 25 MG tablet Take 0.5 tablets (12.5 mg total) by mouth at bedtime as needed (insomnia, agitation). Patient not taking: Reported on 04/23/2023 11/27/22   Alford Highland, MD    Physical Exam: Vitals:   08/13/23 0558 08/13/23 0609 08/13/23 0630 08/13/23 0921  BP: (!) 147/89  (!) 170/49 (!) 143/70  Pulse: (!) 103  (!) 102 89  Resp: (!) 38  (!) 37 18  Temp: 100.3 F (37.9 C)     TempSrc: Oral     SpO2: 93%  90% 94%  Height:  5\' 7"  (1.702 m)      Constitutional: NAD, calm, comfortable Vitals:   08/13/23 0558 08/13/23 0609 08/13/23 0630 08/13/23 0921  BP: (!) 147/89  (!) 170/49 (!) 143/70  Pulse: (!) 103  (!) 102 89  Resp: (!) 38  (!) 37 18  Temp: 100.3 F (37.9 C)     TempSrc: Oral     SpO2: 93%  90% 94%  Height:  5\' 7"  (1.702 m)     Eyes: PERRL, lids and conjunctivae normal ENMT: Mucous membranes are moist. Posterior pharynx clear of any exudate or lesions.Normal dentition.  Neck: normal, supple, no masses, no thyromegaly Respiratory: clear to auscultation bilaterally, no wheezing, scattered crackles on bilateral lower fields, R>L, increasing respiratory effort. No accessory muscle use.  Cardiovascular: Regular rate and rhythm, no murmurs / rubs / gallops. No extremity edema. 2+ pedal pulses. No carotid bruits.  Abdomen: no tenderness, no masses palpated. No hepatosplenomegaly. Bowel sounds positive.  Musculoskeletal: no clubbing  / cyanosis. No joint deformity upper  and lower extremities. Good ROM, no contractures. Normal muscle tone.  Skin: no rashes, lesions, ulcers. No induration Neurologic: CN 2-12 grossly intact. Sensation intact, DTR normal. Strength 5/5 in all 4.  Psychiatric: Normal judgment and insight. Alert and oriented x 3. Normal mood.    Labs on Admission: I have personally reviewed following labs and imaging studies  CBC: Recent Labs  Lab 08/13/23 0614  WBC 4.5  NEUTROABS 4.0  HGB 11.3*  HCT 34.2*  MCV 103.6*  PLT 106*   Basic Metabolic Panel: Recent Labs  Lab 08/13/23 0614  NA 137  K 3.4*  CL 103  CO2 18*  GLUCOSE 273*  BUN 29*  CREATININE 1.77*  CALCIUM 8.5*   GFR: CrCl cannot be calculated (Unknown ideal weight.). Liver Function Tests: Recent Labs  Lab 08/13/23 0614  AST 27  ALT 15  ALKPHOS 68  BILITOT 1.0  PROT 8.1  ALBUMIN 3.9   Recent Labs  Lab 08/13/23 0614  LIPASE 19   No results for input(s): "AMMONIA" in the last 168 hours. Coagulation Profile: Recent Labs  Lab 08/13/23 0651  INR 1.1   Cardiac Enzymes: No results for input(s): "CKTOTAL", "CKMB", "CKMBINDEX", "TROPONINI" in the last 168 hours. BNP (last 3 results) No results for input(s): "PROBNP" in the last 8760 hours. HbA1C: No results for input(s): "HGBA1C" in the last 72 hours. CBG: No results for input(s): "GLUCAP" in the last 168 hours. Lipid Profile: No results for input(s): "CHOL", "HDL", "LDLCALC", "TRIG", "CHOLHDL", "LDLDIRECT" in the last 72 hours. Thyroid Function Tests: No results for input(s): "TSH", "T4TOTAL", "FREET4", "T3FREE", "THYROIDAB" in the last 72 hours. Anemia Panel: No results for input(s): "VITAMINB12", "FOLATE", "FERRITIN", "TIBC", "IRON", "RETICCTPCT" in the last 72 hours. Urine analysis:    Component Value Date/Time   COLORURINE YELLOW (A) 11/19/2022 0111   APPEARANCEUR HAZY (A) 11/19/2022 0111   LABSPEC 1.020 11/19/2022 0111   PHURINE 5.0 11/19/2022 0111    GLUCOSEU NEGATIVE 11/19/2022 0111   HGBUR SMALL (A) 11/19/2022 0111   BILIRUBINUR NEGATIVE 11/19/2022 0111   BILIRUBINUR Negative 08/02/2016 1212   KETONESUR NEGATIVE 11/19/2022 0111   PROTEINUR >=300 (A) 11/19/2022 0111   UROBILINOGEN 0.2 08/02/2016 1212   NITRITE NEGATIVE 11/19/2022 0111   LEUKOCYTESUR SMALL (A) 11/19/2022 0111    Radiological Exams on Admission: CT CHEST ABDOMEN PELVIS W CONTRAST Result Date: 08/13/2023 CLINICAL DATA:  Sepsis. EXAM: CT CHEST, ABDOMEN, AND PELVIS WITH CONTRAST TECHNIQUE: Multidetector CT imaging of the chest, abdomen and pelvis was performed following the standard protocol during bolus administration of intravenous contrast. RADIATION DOSE REDUCTION: This exam was performed according to the departmental dose-optimization program which includes automated exposure control, adjustment of the mA and/or kV according to patient size and/or use of iterative reconstruction technique. CONTRAST:  75mL OMNIPAQUE IOHEXOL 300 MG/ML  SOLN COMPARISON:  04/09/2022. FINDINGS: CT CHEST FINDINGS Cardiovascular: Atheromatous calcifications of the coronary arteries and aorta. Pericardial effusion. No aneurysm. Mediastinum/Nodes: No enlarged mediastinal, hilar, or axillary lymph nodes. Unremarkable thyroid and tracheobronchial tree. Stable esophageal wall thickening small hiatal hernia. Lungs/Pleura: Dependent bibasilar subsegmental atelectasis or minimal consolidation. No pneumothorax or pleural effusion. No evidence of edema. Musculoskeletal: No chest wall mass or suspicious bone lesions identified. Thoracic degenerative disc disease. CT ABDOMEN PELVIS FINDINGS Hepatobiliary: Gallbladder is distended with several calcified stones. No pericholecystic inflammatory changes. No focal hepatic parenchymal abnormalities or biliary ductal dilatation. Pancreas: Fatty atrophy. No focal lesions. No peripancreatic inflammatory changes. Spleen: Calcified granulomata.  No splenomegaly.  Adrenals/Urinary Tract: Bilateral renal cysts,  largest 4.4 cm on the left. No adrenal lesions. No hydronephrosis or nephrolithiasis. Unremarkable urinary bladder. Stomach/Bowel: No dilated bowel to indicate obstruction. Diffuse colonic diverticulosis. Vascular/Lymphatic: Aortic atherosclerosis. No enlarged abdominal or pelvic lymph nodes. Reproductive: Prominent prostate. Other: Rectus diastasis. Anterior abdominal wall defect containing nondilated bowel loops with no evidence of incarceration or obstruction. Musculoskeletal: L2 compression deformity unchanged from prior study. Thoracolumbosacral degenerative disc disease. Osteopenia. IMPRESSION: 1. Dependent bibasilar subsegmental atelectasis or minimal consolidation. 2. Stable esophageal wall thickening and small hiatal hernia. 3. Cholelithiasis. 4. Diverticulosis. 5. Abdominal wall defect without evidence of incarceration or bowel obstruction. 6. Prominent prostate. 7. Aortic Atherosclerosis (ICD10-I70.0). Electronically Signed   By: Layla Maw M.D.   On: 08/13/2023 09:02   DG Chest Port 1 View Result Date: 08/13/2023 CLINICAL DATA:  87 year old male with possible sepsis.  Vomiting. EXAM: PORTABLE CHEST 1 VIEW COMPARISON:  Chest radiographs 11/18/2022 and earlier. FINDINGS: Portable AP upright view at 0633 hours. Mildly lordotic positioning. Similar lung volumes. Mediastinal contours are stable and within normal limits. Visualized tracheal air column is within normal limits. No pneumothorax. No pleural effusion or consolidation. But there is asymmetric reticulonodular peribronchial opacity in the right mid lung. Mild lung base atelectasis and/or scarring not significantly changed. No other acute lung finding. Negative visible bowel gas. No acute osseous abnormality identified. IMPRESSION: 1. New asymmetric reticulonodular peribronchial opacity in the right mid lung. Consider developing bronchopneumonia or mild aspiration in this setting. 2. Stable  appearance of lung base atelectasis and/or scarring. No consolidation or pleural effusion. Electronically Signed   By: Odessa Fleming M.D.   On: 08/13/2023 06:43    EKG: Independently reviewed.  Noisy baseline reported as A-fib but regular otherwise no acute ST changes.  Assessment/Plan Principal Problem:   Sepsis (HCC) Active Problems:   Alzheimer's dementia (HCC)   Aspiration pneumonia (HCC)  (please populate well all problems here in Problem List. (For example, if patient is on BP meds at home and you resume or decide to hold them, it is a problem that needs to be her. Same for CAD, COPD, HLD and so on)  Sepsis -Without significant symptoms or signs of acute endorgan damage, sepsis as evidenced by elevated lactic acid, increased breathing rate/tachypneic, source of infection suspected to be bilateral lower lobe pneumonia/aspiration pneumonia from repeated nauseous vomiting, blood culture sent to rule out bacteremia.  For now plan to continue vancomycin and cefepime regimen to cover both pneumonia and possible bacteremia. Lactic acid significantly improved after IV bolus, plan to hold off further IV boluses.  IVF x 10 hours then reevaluate -Procalcitonin significant elevated, treat sepsis then reevaluate  Intractable nauseous vomiting diarrhea -Appears to be self-limiting, clinically suspect either from food toxin versus viral gastroenteritis. -As needed Zofran -In case there is a further diarrhea, plan to send for GI pathogen  IDDM with hyperglycemia -Resume Lantus 15 unit daily -SSI  HTN -BP stable, resume amlodipine and losartan tomorrow  Alzheimer's dementia -Mentation at baseline, continue Aricept  CKD stage IIIa -Creatinine stable, mild volume contraction with still elevated lactic acid level, IV fluid x 10 hours  DVT prophylaxis: Lovenox Code Status: DNR Family Communication: Daughter at bedside Disposition Plan: Patient is sick with sepsis requiring IV antibiotics, and close  monitoring inpatient, expect more than 2 midnight hospital stay Consults called: None Admission status: Telemetry admission   Emeline General MD [facility] Pager [phone]  08/13/2023, 10:44 AM

## 2023-08-13 NOTE — ED Triage Notes (Signed)
Pt arrives via EMS from [facility] for vomiting. Per EMS the facility did not give much information - pt vomited x1 they did report he has been feeling "unwell". Unknown baseline - pt A/ox2, no complaints at this time, does not appear in distress.

## 2023-08-13 NOTE — Progress Notes (Signed)
CODE SEPSIS - PHARMACY COMMUNICATION  **Broad Spectrum Antibiotics should be administered within 1 hour of Sepsis diagnosis**  Time Code Sepsis Called/Page Received: 0606  Antibiotics Ordered: Cefepime, Flagyl, Vancomycin  Time of 1st antibiotic administration: 6295  Otelia Sergeant, PharmD, [facility] 08/13/2023 6:06 AM

## 2023-08-13 NOTE — Consult Note (Signed)
Pharmacy Antibiotic Note  Alex Smith is a 88 y.o. male admitted on 08/13/2023 with sepsis.  Pharmacy has been consulted for cefepime and vancomycin dosing. Tmax 100.3, lactic acid 3.1, WBC 4.5, procal 6.44. source of infection suspected to be bilateral lower lobe pneumonia/aspiration pneumonia. Chest x-ray concerning for pneumonia. Resp panel in process.   Plan: Will start cefepime 2 g q12H   Patient received vancomycin 1000 mg x 1 in the ED. Will give an additional vancomycin 750 mg IV x 1 for a total of 1750 mg loading dose. Baseline Scr may be around 1.5. Will start vancomycin 750 mg q24H. Predicted AUC of 419. Goal AUC of 400-600. Vd 0.72, SCR 1.77, IBW. Plan to obtain vancomycin level after 4th or 5th dose, or if Scr trends up and pt goes into AKI. Will order MRSA PCR. If concern for only PNA, if MRSA PCR is negative recommend to stop vancomycin.   Height: 5\' 7"  (170.2 cm) Weight: 89.8 kg (198 lb) IBW/kg (Calculated) : 66.1  Temp (24hrs), Avg:100.3 F (37.9 C), Min:100.3 F (37.9 C), Max:100.3 F (37.9 C)  Recent Labs  Lab 08/13/23 0614 08/13/23 0945  WBC 4.5  --   CREATININE 1.77*  --   LATICACIDVEN 5.0* 3.1*    Estimated Creatinine Clearance: 32 mL/min (A) (by C-G formula based on SCr of 1.77 mg/dL (H)).    Allergies  Allergen Reactions   Rituximab Rash    Chest tightness Chest tightness   Blood-Group Specific Substance Other (See Comments)    Had a post transfusion reaction of red blood cells; NOW REQUIRES WASHED BLOOD CELLS Had a post transfusion reaction of red blood cells; NOW REQUIRES WASHED BLOOD CELLS   Primaxin [Imipenem] Other (See Comments)    Possible allergy Tolerates cephalosporins   Voriconazole Other (See Comments)   Sulfa Antibiotics Itching and Rash   Sulfacetamide Sodium Itching and Rash    Antimicrobials this admission: 12/25 cefepime >>  12/25 vancomycin >>   Dose adjustments this admission: None  Microbiology results: 12/25 BCx:  pending  Thank you for allowing pharmacy to be a part of this patient's care.  Ronnald Ramp, PharmD, BCPS 08/13/2023 10:56 AM

## 2023-08-13 NOTE — ED Notes (Signed)
Assumed patient care at approximately 0705 and received report from the previous nurse. Patient appears to be resting comfortably.

## 2023-08-13 NOTE — Sepsis Progress Note (Signed)
 Elink monitoring for the code sepsis protocol. Notified bedside nurse of need to draw and administer antibiotics, lactic acid, and blood cultures.

## 2023-08-13 NOTE — ED Notes (Signed)
Patient had small liquid brown stool. Patient was able to turn self. Daughter is out of the hospital getting something to eat.

## 2023-08-13 NOTE — ED Provider Notes (Addendum)
  Physical Exam  BP (!) 170/49   Pulse (!) 102   Temp 100.3 F (37.9 C) (Oral)   Resp (!) 37   Ht 5\' 7"  (1.702 m)   SpO2 90%   BMI 31.11 kg/m   Physical Exam  Procedures  .Critical Care  Performed by: Janith Lima, MD Authorized by: Janith Lima, MD   Critical care provider statement:    Critical care time (minutes):  30   Critical care was necessary to treat or prevent imminent or life-threatening deterioration of the following conditions:  Sepsis   Critical care was time spent personally by me on the following activities:  Development of treatment plan with patient or surrogate, discussions with consultants, evaluation of patient's response to treatment, examination of patient, ordering and review of laboratory studies, ordering and review of radiographic studies, ordering and performing treatments and interventions, pulse oximetry, re-evaluation of patient's condition and review of old charts   Care discussed with: admitting provider     ED Course / MDM    Medical Decision Making Amount and/or Complexity of Data Reviewed Labs: ordered. Radiology: ordered. ECG/medicine tests: ordered.  Risk OTC drugs. Prescription drug management. Decision regarding hospitalization.   Received patient in signout.  88 year old male with history of dementia, HTN, HLD, DM2, CKD presenting for episode of vomiting at nursing home.  Found to be tachypneic, tachycardic, and febrile on arrival to the ED.  Concerns for sepsis and patient was started on broad-spectrum antibiotics and fluids.    Patient was found to have a lactic acid of 5.0 after handoff.  I started patient on 30 cc/kg of fluids.  Chest x-ray concerning for pneumonia.  Pending CT chest/abdomen/pelvis.  CT shows concern for possible bibasilar consolidation.  Chest x-ray also concerning for pneumonia and patient having elevated lactic concerning for sepsis.  Will admit for ongoing antibiotic treatment to hospitalist.     Janith Lima, MD 08/13/23 0865    Janith Lima, MD 08/13/23 [phone]

## 2023-08-14 ENCOUNTER — Encounter: Payer: Self-pay | Admitting: Internal Medicine

## 2023-08-14 ENCOUNTER — Telehealth: Payer: Self-pay | Admitting: Oncology

## 2023-08-14 DIAGNOSIS — A419 Sepsis, unspecified organism: Secondary | ICD-10-CM | POA: Diagnosis not present

## 2023-08-14 LAB — CBC
HCT: 25.7 % — ABNORMAL LOW (ref 39.0–52.0)
Hemoglobin: 8.3 g/dL — ABNORMAL LOW (ref 13.0–17.0)
MCH: 34.2 pg — ABNORMAL HIGH (ref 26.0–34.0)
MCHC: 32.3 g/dL (ref 30.0–36.0)
MCV: 105.8 fL — ABNORMAL HIGH (ref 80.0–100.0)
Platelets: 68 10*3/uL — ABNORMAL LOW (ref 150–400)
RBC: 2.43 MIL/uL — ABNORMAL LOW (ref 4.22–5.81)
RDW: 16.3 % — ABNORMAL HIGH (ref 11.5–15.5)
WBC: 2.9 10*3/uL — ABNORMAL LOW (ref 4.0–10.5)
nRBC: 0.7 % — ABNORMAL HIGH (ref 0.0–0.2)

## 2023-08-14 LAB — CBG MONITORING, ED
Glucose-Capillary: 86 mg/dL (ref 70–99)
Glucose-Capillary: 119 mg/dL — ABNORMAL HIGH (ref 70–99)
Glucose-Capillary: 107 mg/dL — ABNORMAL HIGH (ref 70–99)
Glucose-Capillary: 118 mg/dL — ABNORMAL HIGH (ref 70–99)

## 2023-08-14 LAB — BASIC METABOLIC PANEL
Anion gap: 7 (ref 5–15)
BUN: 29 mg/dL — ABNORMAL HIGH (ref 8–23)
CO2: 20 mmol/L — ABNORMAL LOW (ref 22–32)
Calcium: 7.1 mg/dL — ABNORMAL LOW (ref 8.9–10.3)
Chloride: 107 mmol/L (ref 98–111)
Creatinine, Ser: 1.61 mg/dL — ABNORMAL HIGH (ref 0.61–1.24)
GFR, Estimated: 41 mL/min — ABNORMAL LOW (ref >60)
Glucose, Bld: 70 mg/dL (ref 70–99)
Potassium: 3 mmol/L — ABNORMAL LOW (ref 3.5–5.1)
Sodium: 134 mmol/L — ABNORMAL LOW (ref 135–145)

## 2023-08-14 LAB — URINALYSIS, W/ REFLEX TO CULTURE (INFECTION SUSPECTED)
Bacteria, UA: NONE SEEN
Bilirubin Urine: NEGATIVE
Glucose, UA: NEGATIVE mg/dL
Ketones, ur: NEGATIVE mg/dL
Leukocytes,Ua: NEGATIVE
Nitrite: NEGATIVE
Protein, ur: NEGATIVE mg/dL
Specific Gravity, Urine: 1.013 (ref 1.005–1.030)
Squamous Epithelial / HPF: 0 /[HPF] (ref 0–5)
pH: 5 (ref 5.0–8.0)

## 2023-08-14 LAB — PROCALCITONIN: Procalcitonin: 8.08 ng/mL

## 2023-08-14 LAB — MRSA NEXT GEN BY PCR, NASAL: MRSA by PCR Next Gen: DETECTED — AB

## 2023-08-14 LAB — HEMOGLOBIN A1C
Hgb A1c MFr Bld: 7.2 % — ABNORMAL HIGH (ref 4.8–5.6)
Mean Plasma Glucose: 160 mg/dL

## 2023-08-14 MED ORDER — POTASSIUM CHLORIDE 20 MEQ PO PACK
60.0000 meq | PACK | Freq: Once | ORAL | Status: AC
  Administered 2023-08-14: 60 meq via ORAL
  Filled 2023-08-14: qty 3

## 2023-08-14 MED ORDER — SODIUM CHLORIDE 0.9 % IV SOLN
2.0000 g | Freq: Two times a day (BID) | INTRAVENOUS | Status: DC
  Administered 2023-08-14: 2 g via INTRAVENOUS
  Filled 2023-08-14 (×2): qty 12.5

## 2023-08-14 NOTE — Progress Notes (Signed)
PROGRESS NOTE    Alex Smith  EAV:[id number] DOB: 11/21/1934 DOA: 08/13/2023 PCP: Almetta Lovely, [facility]  Chief Complaint  Patient presents with   Emesis    Hospital Course:  Alex Smith is 88 y.o. male with Alzheimer's dementia, hypertension, BPH, CKD stage IIIa, insulin-dependent diabetes, who was sent from his nursing home for evaluation of vomiting and diarrhea.  Temperature in the ED 100.3, tachycardic to 103, O2 sat 93% on room air.  Blood work revealed WBC 4.5, lactic acid 5.0, procalcitonin 6.4.  CT abdomen pelvis chest showed bilateral lower lobe infiltrates, no consolidation.  Patient was on vancomycin, cefepime, Flagyl and received 2.7 L fluid bolus.  Subjective: On evaluation this morning patient is improving.  His son is at bedside.   Objective: Vitals:   08/14/23 0700 08/14/23 0852 08/14/23 1000 08/14/23 1011  BP: (!) 136/58  (!) 151/71 (!) 151/71  Pulse: (!) 56  71 75  Resp: 14   19  Temp:  98.1 F (36.7 C)    TempSrc:  Oral    SpO2: 99%  94% 90%  Weight:      Height:        Intake/Output Summary (Last 24 hours) at 08/14/2023 1034 Last data filed at 08/14/2023 7829 Gross per 24 hour  Intake 181.34 ml  Output --  Net 181.34 ml   Filed Weights   08/13/23 1053  Weight: 89.8 kg    Examination: General exam: Appears calm and comfortable, NAD  Respiratory system: No work of breathing, symmetric chest wall expansion Cardiovascular system: S1 & S2 heard, RRR.  Gastrointestinal system: Abdomen is nondistended, soft and nontender.  Neuro: Alert. No focal neurological deficits. Extremities: Symmetric, expected ROM Skin: No rashes, lesions Psychiatry: Demonstrates appropriate judgement and insight. Mood & affect appropriate for situation.   Assessment & Plan:  Principal Problem:   Sepsis (HCC) Active Problems:   Alzheimer's dementia (HCC)   Aspiration pneumonia (HCC)    Sepsis without endorgan damage - Criteria: Lactic acidosis, tachypnea,  fever.  Source of infection suspected pneumonia - Follow blood cultures - Continue broad-spectrum antibiotics for now to cover for pneumonia and possible bacteremia MRSA PCR is positive - Lactic acidosis improved after IV bolus - Hold off on further IV fluids for now - Procalcitonin elevation, repeat after treatment  Vomiting/diarrhea - Self-limited - Likely viral gastroenteritis or food toxin mediated - As needed Zofran - Will send for C. difficile and GI panel if diarrhea recurs  Diabetes with hyperglycemia - Resume home dose Lantus - Sliding scale insulin, titrate up as tolerated  Hypertension - Blood pressure stable, resume home meds for now  Alzheimer's dementia - Appears at current baseline - Continue Aricept - Frequent reorientation  CKD stage IIIa - Creatinine stable, mild volume contraction given elevated lactic acid secondary to recent vomiting and diarrhea.  Resolution with IV fluids  DVT prophylaxis: Lovenox   Code Status: Limited: Do not attempt resuscitation (DNR) -DNR-LIMITED -Do Not Intubate/DNI  Family Communication: Son at bedside and present for our discussion Disposition:  Status is: Inpatient, IV abx    Antimicrobials:  Anti-infectives (From admission, onward)    Start     Dose/Rate Route Frequency Ordered Stop   08/14/23 2200  ceFEPIme (MAXIPIME) 2 g in sodium chloride 0.9 % 100 mL IVPB        2 g 200 mL/hr over 30 Minutes Intravenous Every 12 hours 08/14/23 1006     08/14/23 0600  ceFEPIme (MAXIPIME) 2 g in sodium chloride 0.9 %  100 mL IVPB        2 g 200 mL/hr over 30 Minutes Intravenous  Once 08/13/23 1106 08/14/23 0638   08/13/23 1200  vancomycin (VANCOREADY) IVPB 750 mg/150 mL        750 mg 150 mL/hr over 60 Minutes Intravenous Every 24 hours 08/13/23 1106     08/13/23 0615  ceFEPIme (MAXIPIME) 2 g in sodium chloride 0.9 % 100 mL IVPB        2 g 200 mL/hr over 30 Minutes Intravenous  Once 08/13/23 0603 08/13/23 0650   08/13/23 0615   metroNIDAZOLE (FLAGYL) IVPB 500 mg        500 mg 100 mL/hr over 60 Minutes Intravenous  Once 08/13/23 0603 08/13/23 0931   08/13/23 0615  vancomycin (VANCOCIN) IVPB 1000 mg/200 mL premix        1,000 mg 200 mL/hr over 60 Minutes Intravenous  Once 08/13/23 0603 08/13/23 1610       Data Reviewed: I have personally reviewed following labs and imaging studies CBC: Recent Labs  Lab 08/13/23 0614 08/14/23 0453  WBC 4.5 2.9*  NEUTROABS 4.0  --   HGB 11.3* 8.3*  HCT 34.2* 25.7*  MCV 103.6* 105.8*  PLT 106* 68*   Basic Metabolic Panel: Recent Labs  Lab 08/13/23 0614 08/14/23 0453  NA 137 134*  K 3.4* 3.0*  CL 103 107  CO2 18* 20*  GLUCOSE 273* 70  BUN 29* 29*  CREATININE 1.77* 1.61*  CALCIUM 8.5* 7.1*   GFR: Estimated Creatinine Clearance: 35.2 mL/min (A) (by C-G formula based on SCr of 1.61 mg/dL (H)). Liver Function Tests: Recent Labs  Lab 08/13/23 0614  AST 27  ALT 15  ALKPHOS 68  BILITOT 1.0  PROT 8.1  ALBUMIN 3.9   CBG: Recent Labs  Lab 08/13/23 1130 08/13/23 1316 08/13/23 1632 08/14/23 0734  GLUCAP 158* 135* 100* 86    Recent Results (from the past 240 hours)  MRSA Next Gen by PCR, Nasal     Status: Abnormal   Collection Time: 08/13/23  4:53 AM   Specimen: Nasal Mucosa; Nasal Swab  Result Value Ref Range Status   MRSA by PCR Next Gen DETECTED (A) NOT DETECTED Final    Comment: RESULT CALLED TO, READ BACK BY AND VERIFIED WITH: JESS SAVAGE 08/14/23 1027 KLW (NOTE) The GeneXpert MRSA Assay (FDA approved for NASAL specimens only), is one component of a comprehensive MRSA colonization surveillance program. It is not intended to diagnose MRSA infection nor to guide or monitor treatment for MRSA infections. Test performance is not FDA approved in patients less than 81 years old. Performed at [facility], [address] [place], [place] [address]   Blood Culture (routine x 2)     Status: None (Preliminary result)   Collection Time:  08/13/23  6:14 AM   Specimen: BLOOD  Result Value Ref Range Status   Specimen Description BLOOD LEFT WRIST  Final   Special Requests   Final    BOTTLES DRAWN AEROBIC AND ANAEROBIC Blood Culture results may not be optimal due to an inadequate volume of blood received in culture bottles   Culture   Final    NO GROWTH 1 DAY Performed at [facility], [address] [place], [place] [address]    Report Status PENDING  Incomplete  Blood Culture (routine x 2)     Status: None (Preliminary result)   Collection Time: 08/13/23  6:14 AM   Specimen: BLOOD  Result Value Ref  Range Status   Specimen Description BLOOD RIGHT FA  Final   Special Requests   Final    BOTTLES DRAWN AEROBIC AND ANAEROBIC Blood Culture adequate volume   Culture   Final    NO GROWTH 1 DAY Performed at [facility] [address], [place], [place] [address]    Report Status PENDING  Incomplete  Resp panel by RT-PCR (RSV, Flu A&B, Covid) Anterior Nasal Swab     Status: None   Collection Time: 08/13/23  6:51 AM   Specimen: Anterior Nasal Swab  Result Value Ref Range Status   SARS Coronavirus 2 by RT PCR NEGATIVE NEGATIVE Final    Comment: (NOTE) SARS-CoV-2 target nucleic acids are NOT DETECTED.  The SARS-CoV-2 RNA is generally detectable in upper respiratory specimens during the acute phase of infection. The lowest concentration of SARS-CoV-2 viral copies this assay can detect is 138 copies/mL. A negative result does not preclude SARS-Cov-2 infection and should not be used as the sole basis for treatment or other patient management decisions. A negative result may occur with  improper specimen collection/handling, submission of specimen other than nasopharyngeal swab, presence of viral mutation(s) within the areas targeted by this assay, and inadequate number of viral copies(<138 copies/mL). A negative result must be combined with clinical observations, patient history, and  epidemiological information. The expected result is Negative.  Fact Sheet for Patients:  BloggerCourse.com  Fact Sheet for Healthcare Providers:  SeriousBroker.it  This test is no t yet approved or cleared by the [place] FDA and  has been authorized for detection and/or diagnosis of SARS-CoV-2 by FDA under an Emergency Use Authorization (EUA). This EUA will remain  in effect (meaning this test can be used) for the duration of the COVID-19 declaration under Section 564(b)(1) of the Act, 21 U.S.C.section 360bbb-3(b)(1), unless the authorization is terminated  or revoked sooner.       Influenza A by PCR NEGATIVE NEGATIVE Final   Influenza B by PCR NEGATIVE NEGATIVE Final    Comment: (NOTE) The Xpert Xpress SARS-CoV-2/FLU/RSV plus assay is intended as an aid in the diagnosis of influenza from Nasopharyngeal swab specimens and should not be used as a sole basis for treatment. Nasal washings and aspirates are unacceptable for Xpert Xpress SARS-CoV-2/FLU/RSV testing.  Fact Sheet for Patients: BloggerCourse.com  Fact Sheet for Healthcare Providers: SeriousBroker.it  This test is not yet approved or cleared by the [place] FDA and has been authorized for detection and/or diagnosis of SARS-CoV-2 by FDA under an Emergency Use Authorization (EUA). This EUA will remain in effect (meaning this test can be used) for the duration of the COVID-19 declaration under Section 564(b)(1) of the Act, 21 U.S.C. section 360bbb-3(b)(1), unless the authorization is terminated or revoked.     Resp Syncytial Virus by PCR NEGATIVE NEGATIVE Final    Comment: (NOTE) Fact Sheet for Patients: BloggerCourse.com  Fact Sheet for Healthcare Providers: SeriousBroker.it  This test is not yet approved or cleared by the [place] FDA and has been  authorized for detection and/or diagnosis of SARS-CoV-2 by FDA under an Emergency Use Authorization (EUA). This EUA will remain in effect (meaning this test can be used) for the duration of the COVID-19 declaration under Section 564(b)(1) of the Act, 21 U.S.C. section 360bbb-3(b)(1), unless the authorization is terminated or revoked.  Performed at [facility], [address], [place], [place] [address]      Radiology Studies: CT CHEST ABDOMEN PELVIS W CONTRAST Result Date: 08/13/2023 CLINICAL DATA:  Sepsis. EXAM: CT CHEST, ABDOMEN, AND PELVIS WITH CONTRAST TECHNIQUE: Multidetector CT imaging of the chest, abdomen and pelvis was performed following the standard protocol during bolus administration of intravenous contrast. RADIATION DOSE REDUCTION: This exam was performed according to the departmental dose-optimization program which includes automated exposure control, adjustment of the mA and/or kV according to patient size and/or use of iterative reconstruction technique. CONTRAST:  75mL OMNIPAQUE IOHEXOL 300 MG/ML  SOLN COMPARISON:  04/09/2022. FINDINGS: CT CHEST FINDINGS Cardiovascular: Atheromatous calcifications of the coronary arteries and aorta. Pericardial effusion. No aneurysm. Mediastinum/Nodes: No enlarged mediastinal, hilar, or axillary lymph nodes. Unremarkable thyroid and tracheobronchial tree. Stable esophageal wall thickening small hiatal hernia. Lungs/Pleura: Dependent bibasilar subsegmental atelectasis or minimal consolidation. No pneumothorax or pleural effusion. No evidence of edema. Musculoskeletal: No chest wall mass or suspicious bone lesions identified. Thoracic degenerative disc disease. CT ABDOMEN PELVIS FINDINGS Hepatobiliary: Gallbladder is distended with several calcified stones. No pericholecystic inflammatory changes. No focal hepatic parenchymal abnormalities or biliary ductal dilatation. Pancreas: Fatty atrophy. No focal lesions. No peripancreatic  inflammatory changes. Spleen: Calcified granulomata.  No splenomegaly. Adrenals/Urinary Tract: Bilateral renal cysts, largest 4.4 cm on the left. No adrenal lesions. No hydronephrosis or nephrolithiasis. Unremarkable urinary bladder. Stomach/Bowel: No dilated bowel to indicate obstruction. Diffuse colonic diverticulosis. Vascular/Lymphatic: Aortic atherosclerosis. No enlarged abdominal or pelvic lymph nodes. Reproductive: Prominent prostate. Other: Rectus diastasis. Anterior abdominal wall defect containing nondilated bowel loops with no evidence of incarceration or obstruction. Musculoskeletal: L2 compression deformity unchanged from prior study. Thoracolumbosacral degenerative disc disease. Osteopenia. IMPRESSION: 1. Dependent bibasilar subsegmental atelectasis or minimal consolidation. 2. Stable esophageal wall thickening and small hiatal hernia. 3. Cholelithiasis. 4. Diverticulosis. 5. Abdominal wall defect without evidence of incarceration or bowel obstruction. 6. Prominent prostate. 7. Aortic Atherosclerosis (ICD10-I70.0). Electronically Signed   By: Layla Maw M.D.   On: 08/13/2023 09:02   DG Chest Port 1 View Result Date: 08/13/2023 CLINICAL DATA:  88 year old male with possible sepsis.  Vomiting. EXAM: PORTABLE CHEST 1 VIEW COMPARISON:  Chest radiographs 11/18/2022 and earlier. FINDINGS: Portable AP upright view at 0633 hours. Mildly lordotic positioning. Similar lung volumes. Mediastinal contours are stable and within normal limits. Visualized tracheal air column is within normal limits. No pneumothorax. No pleural effusion or consolidation. But there is asymmetric reticulonodular peribronchial opacity in the right mid lung. Mild lung base atelectasis and/or scarring not significantly changed. No other acute lung finding. Negative visible bowel gas. No acute osseous abnormality identified. IMPRESSION: 1. New asymmetric reticulonodular peribronchial opacity in the right mid lung. Consider  developing bronchopneumonia or mild aspiration in this setting. 2. Stable appearance of lung base atelectasis and/or scarring. No consolidation or pleural effusion. Electronically Signed   By: Odessa Fleming M.D.   On: 08/13/2023 06:43    Scheduled Meds:  acetaminophen  1,000 mg Oral BID   amLODipine  10 mg Oral Daily   aspirin EC  81 mg Oral Daily   donepezil  5 mg Oral Daily   enoxaparin (LOVENOX) injection  40 mg Subcutaneous Q24H   famotidine  20 mg Oral Daily   insulin aspart  0-9 Units Subcutaneous TID WC   insulin glargine-yfgn  15 Units Subcutaneous Daily   losartan  25 mg Oral Daily   melatonin  10 mg Oral QHS   pantoprazole  40 mg Oral BID   pravastatin  20 mg Oral Daily   sertraline  25 mg Oral Daily   tamsulosin  0.4 mg Oral Daily   torsemide  20 mg Oral  Daily   traZODone  25 mg Oral QHS   Continuous Infusions:  ceFEPime (MAXIPIME) IV     vancomycin Stopped (08/13/23 1444)     LOS: 1 day    Time spent:   Debarah Crape, DO [facility]  To contact the attending physician between 7A-7P please use Epic Chat. To contact the covering physician during after hours 7P-7A, please review Amion.   08/14/2023, 10:34 AM   *This document has been created with the assistance of dictation software. Please excuse typographical errors. *

## 2023-08-14 NOTE — Telephone Encounter (Signed)
Reschedule after 2-3 weeks

## 2023-08-14 NOTE — Evaluation (Signed)
Physical Therapy Evaluation Patient Details Name: Alex Smith MRN: [id number] DOB: [birth year] Today's Date: 08/14/2023  History of Present Illness  CICEL PASIERB is a 88 y.o. male with medical history significant of Alzheimer's dementia, HTN, BPH, CKD stage IIIa, IDDM, sent from ALF for evaluation of new onset of nauseous vomiting and diarrhea.  Clinical Impression  Pt pleasant and willing to work with PT, daughter present and able to give some history, etc.  Pt ultimately did well with CGA only for getting to EOB and then able to stand and ambulate ~300 ft with walker and only minimal cuing and CGA.  On O2 on arrival, removed and maintained mid/low 90s nearly the entire time (briefly into 17s with activity, improved with focused breathing cues), SpO2 95% end of session, nursing aware.  Pt overall is a little weaker than his baseline and would benefit from further PT here and at discharge, but should be able to return to ALF safely with continued PT in that setting.       If plan is discharge home, recommend the following: A little help with bathing/dressing/bathroom;A little help with walking and/or transfers;Assistance with cooking/housework;Assist for transportation;Supervision due to cognitive status   Can travel by private vehicle        Equipment Recommendations None recommended by PT  Recommendations for Other Services       Functional Status Assessment Patient has had a recent decline in their functional status and demonstrates the ability to make significant improvements in function in a reasonable and predictable amount of time.     Precautions / Restrictions Precautions Precautions: Fall Restrictions Weight Bearing Restrictions Per Provider Order: No      Mobility  Bed Mobility Overal bed mobility: Needs Assistance Bed Mobility: Supine to Sit, Sit to Supine     Supine to sit: Contact guard Sit to supine: Min assist   General bed mobility comments: needed  assist to get back into bed and scooted up, did well with getting up to EOB slowly but w/o direct assist.  Reports sometimes sleeps in recliner and uses rails at baseline    Transfers Overall transfer level: Needs assistance Equipment used: Rolling walker (2 wheels) Transfers: Sit to/from Stand Sit to Stand: Supervision           General transfer comment: Pt needing cues for UE use to rise and to insure appropriate set up before return to sit; needed cues and CGA but was able to do so w/o direct phiscal assist    Ambulation/Gait Ambulation/Gait assistance: Contact guard assist Gait Distance (Feet): 300 Feet Assistive device: Rolling walker (2 wheels)         General Gait Details: Pt was able to ambulate relatively confidently with walker.  He had some mild fatigue but SpO2 remained in the 90s on room air most of the time (brief drops into high 80s, improved with cued PLB)  Stairs            Wheelchair Mobility     Tilt Bed    Modified Rankin (Stroke Patients Only)       Balance Overall balance assessment: Needs assistance Sitting-balance support: No upper extremity supported, Feet supported Sitting balance-Leahy Scale: Normal     Standing balance support: During functional activity, Bilateral upper extremity supported Standing balance-Leahy Scale: Fair                               Pertinent Vitals/Pain  Pain Assessment Pain Assessment: No/denies pain    Home Living Family/patient expects to be discharged to:: Assisted living                 Home Equipment: Agricultural consultant (2 wheels) Additional Comments: [facility]    Prior Function Prior Level of Function : Needs assist               ADLs Comments: assist for bathing and IADLs     Extremity/Trunk Assessment   Upper Extremity Assessment Upper Extremity Assessment: Overall WFL for tasks assessed    Lower Extremity Assessment Lower Extremity  Assessment: Generalized weakness       Communication   Communication Communication: No apparent difficulties  Cognition Arousal: Alert Behavior During Therapy: WFL for tasks assessed/performed Overall Cognitive Status: History of cognitive impairments - at baseline                                          General Comments General comments (skin integrity, edema, etc.): Pt moved relatively well, still more limited than baseline but no LOBs or overt safety concerns with prolonged ambulation effort    Exercises     Assessment/Plan    PT Assessment Patient needs continued PT services  PT Problem List Decreased strength;Decreased activity tolerance;Decreased mobility;Decreased cognition;Decreased knowledge of use of DME;Decreased safety awareness;Cardiopulmonary status limiting activity       PT Treatment Interventions DME instruction;Gait training;Therapeutic activities;Functional mobility training;Therapeutic exercise;Balance training;Neuromuscular re-education;Cognitive remediation;Patient/family education    PT Goals (Current goals can be found in the Care Plan section)  Acute Rehab PT Goals Patient Stated Goal: Go home PT Goal Formulation: With patient/family Time For Goal Achievement: 08/27/23 Potential to Achieve Goals: Good    Frequency Min 1X/week     Co-evaluation               AM-PAC PT "6 Clicks" Mobility  Outcome Measure Help needed turning from your back to your side while in a flat bed without using bedrails?: None Help needed moving from lying on your back to sitting on the side of a flat bed without using bedrails?: None Help needed moving to and from a bed to a chair (including a wheelchair)?: A Little Help needed standing up from a chair using your arms (e.g., wheelchair or bedside chair)?: A Little Help needed to walk in hospital room?: A Little Help needed climbing 3-5 steps with a railing? : A Lot 6 Click Score: 19    End of  Session Equipment Utilized During Treatment: Gait belt Activity Tolerance: Patient tolerated treatment well;Patient limited by fatigue Patient left: in bed;with call bell/phone within reach Nurse Communication: Mobility status PT Visit Diagnosis: Muscle weakness (generalized) (M62.81);Difficulty in walking, not elsewhere classified (R26.2)    Time: 2956-2130 PT Time Calculation (min) (ACUTE ONLY): 29 min   Charges:   PT Evaluation $PT Eval Low Complexity: 1 Low PT Treatments $Gait Training: 8-22 mins PT General Charges $$ ACUTE PT VISIT: 1 Visit         Malachi Pro, DPT 08/14/2023, 12:34 PM

## 2023-08-14 NOTE — Telephone Encounter (Signed)
Pt daughter canceled lab and injection appt for tomorrow 12/27 due to pt being in the hospital. Please advise on when to r/s the appt and call her back, thank you

## 2023-08-14 NOTE — ED Notes (Signed)
Pt cleaned and repositioned in bed.

## 2023-08-14 NOTE — ED Notes (Signed)
Urine  Cx due 08/13/2023 sent down lab 08/13/2023 sent down to lab with save tube label. Lab notified to be expecting it.

## 2023-08-14 NOTE — Evaluation (Signed)
Occupational Therapy Evaluation Patient Details Name: Alex Smith MRN: [id number] DOB: [birth year] Today's Date: 08/14/2023   History of Present Illness MIKING COVIN is a 88 y.o. male with medical history significant of Alzheimer's dementia, HTN, BPH, CKD stage IIIa, IDDM, sent from ALF for evaluation of new onset of nauseous vomiting and diarrhea.   Clinical Impression   Mr Grice was seen for OT evaluation this date. Prior to hospital admission, pt was MOD I using RW. Pt lives at [facility]. Pt currently requires SUPERVISION don B shoes in sitting and gown in standing. CGA + RW for ADL t/f ~200 ft with 1 standing rest break. Pt reports near baseline for ADLs, will sign off.  Upon hospital discharge, recommend no OT follow up.    If plan is discharge home, recommend the following: Help with stairs or ramp for entrance    Functional Status Assessment  Patient has had a recent decline in their functional status and demonstrates the ability to make significant improvements in function in a reasonable and predictable amount of time.  Equipment Recommendations  None recommended by OT    Recommendations for Other Services       Precautions / Restrictions Precautions Precautions: Fall Restrictions Weight Bearing Restrictions Per Provider Order: No      Mobility Bed Mobility Overal bed mobility: Needs Assistance Bed Mobility: Supine to Sit, Sit to Supine     Supine to sit: Min assist Sit to supine: Min assist   General bed mobility comments: assist for trunk, reports sometimes sleeps in recliner and uses rails at baseline    Transfers Overall transfer level: Needs assistance Equipment used: Rolling walker (2 wheels) Transfers: Sit to/from Stand Sit to Stand: Supervision                  Balance Overall balance assessment: Needs assistance Sitting-balance support: No upper extremity supported, Feet supported Sitting balance-Leahy Scale: Normal      Standing balance support: Single extremity supported, During functional activity Standing balance-Leahy Scale: Fair                             ADL either performed or assessed with clinical judgement   ADL Overall ADL's : Needs assistance/impaired                                       General ADL Comments: SUPERVISION don B shoes in sitting and gown in standing. CGA + RW for ADL t/f ~200 ft with 1 standing rest break      Pertinent Vitals/Pain Pain Assessment Pain Assessment: No/denies pain     Extremity/Trunk Assessment Upper Extremity Assessment Upper Extremity Assessment: Overall WFL for tasks assessed   Lower Extremity Assessment Lower Extremity Assessment: Overall WFL for tasks assessed       Communication Communication Communication: No apparent difficulties   Cognition Arousal: Alert Behavior During Therapy: WFL for tasks assessed/performed Overall Cognitive Status: Within Functional Limits for tasks assessed                                                  Home Living Family/patient expects to be discharged to:: Assisted living  Home Equipment: Agricultural consultant (2 wheels)   Additional Comments: [facility]      Prior Functioning/Environment Prior Level of Function : Needs assist               ADLs Comments: assist for bathing and IADLs        OT Problem List: Decreased strength         OT Goals(Current goals can be found in the care plan section) Acute Rehab OT Goals Patient Stated Goal: to go home OT Goal Formulation: With patient/family Time For Goal Achievement: 08/14/23 Potential to Achieve Goals: Good   AM-PAC OT "6 Clicks" Daily Activity     Outcome Measure Help from another person eating meals?: None Help from another person taking care of personal grooming?: None Help from another person toileting, which includes  using toliet, bedpan, or urinal?: A Little Help from another person bathing (including washing, rinsing, drying)?: A Little Help from another person to put on and taking off regular upper body clothing?: None Help from another person to put on and taking off regular lower body clothing?: None 6 Click Score: 22   End of Session Equipment Utilized During Treatment: Rolling walker (2 wheels)  Activity Tolerance: Patient tolerated treatment well Patient left: in bed;with call bell/phone within reach;with family/visitor present  OT Visit Diagnosis: Muscle weakness (generalized) (M62.81)                Time: 1610-9604 OT Time Calculation (min): 14 min Charges:  OT General Charges $OT Visit: 1 Visit OT Evaluation $OT Eval Low Complexity: 1 Low  Kathie Dike, M.S. OTR/L  08/14/23, 11:29 AM  ascom [phone]

## 2023-08-14 NOTE — ED Notes (Signed)
Pt confirmed MRSA. HCP made aware.

## 2023-08-15 ENCOUNTER — Inpatient Hospital Stay: Payer: PPO

## 2023-08-15 DIAGNOSIS — A419 Sepsis, unspecified organism: Secondary | ICD-10-CM | POA: Diagnosis not present

## 2023-08-15 LAB — PHOSPHORUS: Phosphorus: 2.6 mg/dL (ref 2.5–4.6)

## 2023-08-15 LAB — COMPREHENSIVE METABOLIC PANEL
ALT: 22 U/L (ref 0–44)
AST: 45 U/L — ABNORMAL HIGH (ref 15–41)
Albumin: 2.9 g/dL — ABNORMAL LOW (ref 3.5–5.0)
Alkaline Phosphatase: 53 U/L (ref 38–126)
Anion gap: 11 (ref 5–15)
BUN: 29 mg/dL — ABNORMAL HIGH (ref 8–23)
CO2: 21 mmol/L — ABNORMAL LOW (ref 22–32)
Calcium: 8.3 mg/dL — ABNORMAL LOW (ref 8.9–10.3)
Chloride: 107 mmol/L (ref 98–111)
Creatinine, Ser: 1.67 mg/dL — ABNORMAL HIGH (ref 0.61–1.24)
GFR, Estimated: 40 mL/min — ABNORMAL LOW (ref >60)
Glucose, Bld: 111 mg/dL — ABNORMAL HIGH (ref 70–99)
Potassium: 3.7 mmol/L (ref 3.5–5.1)
Sodium: 139 mmol/L (ref 135–145)
Total Bilirubin: 0.8 mg/dL (ref <1.2)
Total Protein: 6.2 g/dL — ABNORMAL LOW (ref 6.5–8.1)

## 2023-08-15 LAB — CBC WITH DIFFERENTIAL/PLATELET
Abs Immature Granulocytes: 0.01 10*3/uL (ref 0.00–0.07)
Basophils Absolute: 0 10*3/uL (ref 0.0–0.1)
Basophils Relative: 0 %
Eosinophils Absolute: 0.1 10*3/uL (ref 0.0–0.5)
Eosinophils Relative: 4 %
HCT: 25.6 % — ABNORMAL LOW (ref 39.0–52.0)
Hemoglobin: 8.6 g/dL — ABNORMAL LOW (ref 13.0–17.0)
Immature Granulocytes: 0 %
Lymphocytes Relative: 29 %
Lymphs Abs: 0.8 10*3/uL (ref 0.7–4.0)
MCH: 35.1 pg — ABNORMAL HIGH (ref 26.0–34.0)
MCHC: 33.6 g/dL (ref 30.0–36.0)
MCV: 104.5 fL — ABNORMAL HIGH (ref 80.0–100.0)
Monocytes Absolute: 0.2 10*3/uL (ref 0.1–1.0)
Monocytes Relative: 6 %
Neutro Abs: 1.7 10*3/uL (ref 1.7–7.7)
Neutrophils Relative %: 61 %
Platelets: 62 10*3/uL — ABNORMAL LOW (ref 150–400)
RBC: 2.45 MIL/uL — ABNORMAL LOW (ref 4.22–5.81)
RDW: 15.8 % — ABNORMAL HIGH (ref 11.5–15.5)
Smear Review: NORMAL
WBC: 2.8 10*3/uL — ABNORMAL LOW (ref 4.0–10.5)
nRBC: 0.7 % — ABNORMAL HIGH (ref 0.0–0.2)

## 2023-08-15 LAB — GLUCOSE, CAPILLARY
Glucose-Capillary: 112 mg/dL — ABNORMAL HIGH (ref 70–99)
Glucose-Capillary: 166 mg/dL — ABNORMAL HIGH (ref 70–99)

## 2023-08-15 LAB — MAGNESIUM: Magnesium: 2.1 mg/dL (ref 1.7–2.4)

## 2023-08-15 MED ORDER — LEVOFLOXACIN 750 MG PO TABS
750.0000 mg | ORAL_TABLET | ORAL | Status: DC
  Administered 2023-08-15: 750 mg via ORAL
  Filled 2023-08-15: qty 1

## 2023-08-15 MED ORDER — LEVOFLOXACIN 750 MG PO TABS: 750.0000 mg | ORAL_TABLET | ORAL | 0 refills | Status: AC

## 2023-08-15 NOTE — Discharge Summary (Signed)
Physician Discharge Summary   Patient: Alex Smith MRN: [id number] DOB: 03/26/1935  Admit date:     08/13/2023  Discharge date: 08/15/23  Discharge Physician: Debarah Crape   PCP: [facility]   Recommendations at discharge:   Follow-up with your oncologist next week to monitor your blood counts.    Discharge Diagnoses: Principal Problem:   Sepsis (HCC) Active Problems:   Alzheimer's dementia (HCC)   Aspiration pneumonia (HCC)  Resolved Problems:   * No resolved hospital problems. *  Hospital Course: Alex Smith is 88 y.o. male with myelodysplasia, Alzheimer's dementia, hypertension, BPH, CKD stage IIIa, insulin-dependent diabetes, who was sent from his nursing home for evaluation of vomiting and diarrhea.  Temperature in the ED 100.3, tachycardic to 103, O2 sat 93% on room air.  Blood work revealed WBC 4.5, lactic acid 5.0, procalcitonin 6.4.  CT abdomen pelvis chest showed bilateral lower lobe infiltrates, concerning for infection.  Patient was started on broad-spectrum antibiotics.  He was admitted for observation. Patient improved significantly.  He had no further vomiting or diarrhea.  He remained afebrile.  He did endorse some coughing and upper respiratory signs and symptoms and thus he was treated for community-acquired pneumonia with renally dosed Levaquin. By evaluation on 12/27 patient had clinically improved.  His son is at bedside who reported that the patient seemed back to baseline.  Patient is chronically leukopenic and thrombocytopenic due to his myelodysplasia, though his labs were trending down on day of discharge.  I reached out directly to his oncologist who plans to see the patient in the clinic early next week to follow-up on these levels.  Patient will need to complete a course of Levaquin for his community-acquired pneumonia.  He is being discharged back to his assisted living facility with home health physical therapy.  Care was discussed  directly with the patient as well as his son who is at bedside for evaluation.  They endorsed understanding.    Consultants: None Procedures performed: None Disposition: Assisted living Diet recommendation:  Discharge Diet Orders (From admission, onward)     Start     Ordered   08/15/23 0000  Diet - low sodium heart healthy        08/15/23 1300           Cardiac and Carb modified diet DISCHARGE MEDICATION: Allergies as of 08/15/2023       Reactions   Rituximab Rash   Chest tightness Chest tightness   Blood-group Specific Substance Other (See Comments)   Had a post transfusion reaction of red blood cells; NOW REQUIRES WASHED BLOOD CELLS Had a post transfusion reaction of red blood cells; NOW REQUIRES WASHED BLOOD CELLS   Primaxin [imipenem] Other (See Comments)   Possible allergy Tolerates cephalosporins   Voriconazole Other (See Comments)   Sulfa Antibiotics Itching, Rash   Sulfacetamide Sodium Itching, Rash        Medication List     STOP taking these medications    clindamycin 300 MG capsule Commonly known as: CLEOCIN   QUEtiapine 25 MG tablet Commonly known as: SEROQUEL       TAKE these medications    acetaminophen 500 MG tablet Commonly known as: TYLENOL Take 2 tablets (1,000 mg total) by mouth 2 (two) times daily.   albuterol 108 (90 Base) MCG/ACT inhaler Commonly known as: VENTOLIN HFA Inhale 2 puffs into the lungs every 6 (six) hours as needed for wheezing or shortness of breath.   alum & mag hydroxide-simeth  200-200-20 MG/5ML suspension Commonly known as: MAALOX/MYLANTA Take 30 mLs by mouth every 4 (four) hours as needed for indigestion or heartburn.   amLODipine 10 MG tablet Commonly known as: NORVASC Take 1 tablet (10 mg total) by mouth daily.   aspirin EC 81 MG tablet Take 81 mg by mouth daily.   cyanocobalamin 1000 MCG tablet Commonly known as: VITAMIN B12 Take 1 tablet (1,000 mcg total) by mouth daily.   donepezil 5 MG  disintegrating tablet Commonly known as: ARICEPT ODT Take 5 mg by mouth daily. What changed: Another medication with the same name was removed. Continue taking this medication, and follow the directions you see here.   famotidine 20 MG tablet Commonly known as: PEPCID Take 20 mg by mouth daily.   insulin glargine 100 UNIT/ML Solostar Pen Commonly known as: LANTUS Inject 6 Units into the skin at bedtime. What changed: how much to take   insulin lispro 100 UNIT/ML KwikPen Commonly known as: HUMALOG Inject 2 Units into the skin 3 (three) times daily before meals. Per sliding scale   insulin lispro 100 UNIT/ML KwikPen Commonly known as: HUMALOG Inject into the skin. SLIDING SCALE   levofloxacin 750 MG tablet Commonly known as: LEVAQUIN Take 1 tablet (750 mg total) by mouth every other day for 6 days. Start taking on: August 17, 2023   loperamide 2 MG tablet Commonly known as: IMODIUM A-D Take 2 mg by mouth 4 (four) times daily as needed for diarrhea or loose stools.   losartan 25 MG tablet Commonly known as: Cozaar Take 1 tablet (25 mg total) by mouth daily.   magnesium hydroxide 400 MG/5ML suspension Commonly known as: MILK OF MAGNESIA Take 5 mLs by mouth daily as needed for mild constipation.   melatonin 5 MG Tabs Take 10 mg by mouth at bedtime.   multivitamin with minerals Tabs tablet Take 1 tablet by mouth daily.   pantoprazole 40 MG tablet Commonly known as: PROTONIX Take 1 tablet (40 mg total) by mouth 2 (two) times daily.   pravastatin 20 MG tablet Commonly known as: PRAVACHOL Take 20 mg by mouth daily.   sertraline 25 MG tablet Commonly known as: ZOLOFT Take 25 mg by mouth daily.   tamsulosin 0.4 MG Caps capsule Commonly known as: FLOMAX TAKE 1 CAPSULE BY MOUTH EVERY DAY   torsemide 20 MG tablet Commonly known as: DEMADEX Take 20 mg by mouth daily.   traZODone 50 MG tablet Commonly known as: DESYREL Take 25 mg by mouth at bedtime.   vitamin E  180 MG (400 UNITS) capsule Take 400 Units by mouth daily.        Follow-up Information     [facility] Follow up.   Specialty: Geriatric Medicine Why: Hospital follow up Contact information: [address] [place] [address] [phone]                Discharge Exam: Filed Weights   08/13/23 1053  Weight: 89.8 kg   Constitutional:  Normal appearance. Non toxic-appearing.  HENT: Head Normocephalic and atraumatic.  Mucous membranes are moist.  Eyes:  Extraocular intact. Conjunctivae normal. Pupils are equal, round, and reactive to light.  Cardiovascular: Rate and Rhythm: Normal rate and regular rhythm.  Pulmonary: Non labored, symmetric rise of chest wall.  Musculoskeletal:  Normal range of motion.  Skin: warm and dry. not jaundiced.  Neurological: No focal deficit present. alert. Oriented. Psychiatric: Mood and Affect congruent.    Condition at discharge: stable  The results of significant diagnostics from this hospitalization (including imaging, microbiology, ancillary and laboratory) are listed below for reference.   Imaging Studies: CT CHEST ABDOMEN PELVIS W CONTRAST Result Date: 08/13/2023 CLINICAL DATA:  Sepsis. EXAM: CT CHEST, ABDOMEN, AND PELVIS WITH CONTRAST TECHNIQUE: Multidetector CT imaging of the chest, abdomen and pelvis was performed following the standard protocol during bolus administration of intravenous contrast. RADIATION DOSE REDUCTION: This exam was performed according to the departmental dose-optimization program which includes automated exposure control, adjustment of the mA and/or kV according to patient size and/or use of iterative reconstruction technique. CONTRAST:  75mL OMNIPAQUE IOHEXOL 300 MG/ML  SOLN COMPARISON:  04/09/2022. FINDINGS: CT CHEST FINDINGS Cardiovascular: Atheromatous calcifications of the coronary arteries and aorta. Pericardial effusion. No aneurysm. Mediastinum/Nodes: No enlarged mediastinal,  hilar, or axillary lymph nodes. Unremarkable thyroid and tracheobronchial tree. Stable esophageal wall thickening small hiatal hernia. Lungs/Pleura: Dependent bibasilar subsegmental atelectasis or minimal consolidation. No pneumothorax or pleural effusion. No evidence of edema. Musculoskeletal: No chest wall mass or suspicious bone lesions identified. Thoracic degenerative disc disease. CT ABDOMEN PELVIS FINDINGS Hepatobiliary: Gallbladder is distended with several calcified stones. No pericholecystic inflammatory changes. No focal hepatic parenchymal abnormalities or biliary ductal dilatation. Pancreas: Fatty atrophy. No focal lesions. No peripancreatic inflammatory changes. Spleen: Calcified granulomata.  No splenomegaly. Adrenals/Urinary Tract: Bilateral renal cysts, largest 4.4 cm on the left. No adrenal lesions. No hydronephrosis or nephrolithiasis. Unremarkable urinary bladder. Stomach/Bowel: No dilated bowel to indicate obstruction. Diffuse colonic diverticulosis. Vascular/Lymphatic: Aortic atherosclerosis. No enlarged abdominal or pelvic lymph nodes. Reproductive: Prominent prostate. Other: Rectus diastasis. Anterior abdominal wall defect containing nondilated bowel loops with no evidence of incarceration or obstruction. Musculoskeletal: L2 compression deformity unchanged from prior study. Thoracolumbosacral degenerative disc disease. Osteopenia. IMPRESSION: 1. Dependent bibasilar subsegmental atelectasis or minimal consolidation. 2. Stable esophageal wall thickening and small hiatal hernia. 3. Cholelithiasis. 4. Diverticulosis. 5. Abdominal wall defect without evidence of incarceration or bowel obstruction. 6. Prominent prostate. 7. Aortic Atherosclerosis (ICD10-I70.0). Electronically Signed   By: Layla Maw M.D.   On: 08/13/2023 09:02   DG Chest Port 1 View Result Date: 08/13/2023 CLINICAL DATA:  88 year old male with possible sepsis.  Vomiting. EXAM: PORTABLE CHEST 1 VIEW COMPARISON:  Chest  radiographs 11/18/2022 and earlier. FINDINGS: Portable AP upright view at 0633 hours. Mildly lordotic positioning. Similar lung volumes. Mediastinal contours are stable and within normal limits. Visualized tracheal air column is within normal limits. No pneumothorax. No pleural effusion or consolidation. But there is asymmetric reticulonodular peribronchial opacity in the right mid lung. Mild lung base atelectasis and/or scarring not significantly changed. No other acute lung finding. Negative visible bowel gas. No acute osseous abnormality identified. IMPRESSION: 1. New asymmetric reticulonodular peribronchial opacity in the right mid lung. Consider developing bronchopneumonia or mild aspiration in this setting. 2. Stable appearance of lung base atelectasis and/or scarring. No consolidation or pleural effusion. Electronically Signed   By: Odessa Fleming M.D.   On: 08/13/2023 06:43    Microbiology: Results for orders placed or performed during the hospital encounter of 08/13/23  MRSA Next Gen by PCR, Nasal     Status: Abnormal   Collection Time: 08/13/23  4:53 AM   Specimen: Nasal Mucosa; Nasal Swab  Result Value Ref Range Status   MRSA by PCR Next Gen DETECTED (A) NOT DETECTED Final    Comment: RESULT CALLED TO, READ BACK BY AND VERIFIED WITH: JESS SAVAGE 08/14/23 1027 KLW (NOTE) The GeneXpert MRSA Assay (FDA approved for NASAL specimens only), is one component  of a comprehensive MRSA colonization surveillance program. It is not intended to diagnose MRSA infection nor to guide or monitor treatment for MRSA infections. Test performance is not FDA approved in patients less than 62 years old. Performed at [facility], [address] [place], [place] [address]   Blood Culture (routine x 2)     Status: None (Preliminary result)   Collection Time: 08/13/23  6:14 AM   Specimen: BLOOD  Result Value Ref Range Status   Specimen Description BLOOD LEFT WRIST  Final   Special Requests   Final     BOTTLES DRAWN AEROBIC AND ANAEROBIC Blood Culture results may not be optimal due to an inadequate volume of blood received in culture bottles   Culture   Final    NO GROWTH 2 DAYS Performed at [facility], [address] [place], [place] [address]    Report Status PENDING  Incomplete  Blood Culture (routine x 2)     Status: None (Preliminary result)   Collection Time: 08/13/23  6:14 AM   Specimen: BLOOD  Result Value Ref Range Status   Specimen Description BLOOD RIGHT FA  Final   Special Requests   Final    BOTTLES DRAWN AEROBIC AND ANAEROBIC Blood Culture adequate volume   Culture   Final    NO GROWTH 2 DAYS Performed at [facility], [address], [place], [place] [address]    Report Status PENDING  Incomplete  Resp panel by RT-PCR (RSV, Flu A&B, Covid) Anterior Nasal Swab     Status: None   Collection Time: 08/13/23  6:51 AM   Specimen: Anterior Nasal Swab  Result Value Ref Range Status   SARS Coronavirus 2 by RT PCR NEGATIVE NEGATIVE Final    Comment: (NOTE) SARS-CoV-2 target nucleic acids are NOT DETECTED.  The SARS-CoV-2 RNA is generally detectable in upper respiratory specimens during the acute phase of infection. The lowest concentration of SARS-CoV-2 viral copies this assay can detect is 138 copies/mL. A negative result does not preclude SARS-Cov-2 infection and should not be used as the sole basis for treatment or other patient management decisions. A negative result may occur with  improper specimen collection/handling, submission of specimen other than nasopharyngeal swab, presence of viral mutation(s) within the areas targeted by this assay, and inadequate number of viral copies(<138 copies/mL). A negative result must be combined with clinical observations, patient history, and epidemiological information. The expected result is Negative.  Fact Sheet for Patients:  BloggerCourse.com  Fact Sheet for Healthcare  Providers:  SeriousBroker.it  This test is no t yet approved or cleared by the [place] FDA and  has been authorized for detection and/or diagnosis of SARS-CoV-2 by FDA under an Emergency Use Authorization (EUA). This EUA will remain  in effect (meaning this test can be used) for the duration of the COVID-19 declaration under Section 564(b)(1) of the Act, 21 U.S.C.section 360bbb-3(b)(1), unless the authorization is terminated  or revoked sooner.       Influenza A by PCR NEGATIVE NEGATIVE Final   Influenza B by PCR NEGATIVE NEGATIVE Final    Comment: (NOTE) The Xpert Xpress SARS-CoV-2/FLU/RSV plus assay is intended as an aid in the diagnosis of influenza from Nasopharyngeal swab specimens and should not be used as a sole basis for treatment. Nasal washings and aspirates are unacceptable for Xpert Xpress SARS-CoV-2/FLU/RSV testing.  Fact Sheet for Patients: BloggerCourse.com  Fact Sheet for Healthcare Providers: SeriousBroker.it  This test is not yet approved or cleared  by the [place] and has been authorized for detection and/or diagnosis of SARS-CoV-2 by FDA under an Emergency Use Authorization (EUA). This EUA will remain in effect (meaning this test can be used) for the duration of the COVID-19 declaration under Section 564(b)(1) of the Act, 21 U.S.C. section 360bbb-3(b)(1), unless the authorization is terminated or revoked.     Resp Syncytial Virus by PCR NEGATIVE NEGATIVE Final    Comment: (NOTE) Fact Sheet for Patients: BloggerCourse.com  Fact Sheet for Healthcare Providers: SeriousBroker.it  This test is not yet approved or cleared by the [place] FDA and has been authorized for detection and/or diagnosis of SARS-CoV-2 by FDA under an Emergency Use Authorization (EUA). This EUA will remain in effect (meaning this test can be  used) for the duration of the COVID-19 declaration under Section 564(b)(1) of the Act, 21 U.S.C. section 360bbb-3(b)(1), unless the authorization is terminated or revoked.  Performed at [facility], [address] [place], [place] [address]    *Note: Due to a large number of results and/or encounters for the requested time period, some results have not been displayed. A complete set of results can be found in Results Review.    Labs: CBC: Recent Labs  Lab 08/13/23 0614 08/14/23 0453 08/15/23 0505  WBC 4.5 2.9* 2.8*  NEUTROABS 4.0  --  1.7  HGB 11.3* 8.3* 8.6*  HCT 34.2* 25.7* 25.6*  MCV 103.6* 105.8* 104.5*  PLT 106* 68* 62*   Basic Metabolic Panel: Recent Labs  Lab 08/13/23 0614 08/14/23 0453 08/15/23 0505  NA 137 134* 139  K 3.4* 3.0* 3.7  CL 103 107 107  CO2 18* 20* 21*  GLUCOSE 273* 70 111*  BUN 29* 29* 29*  CREATININE 1.77* 1.61* 1.67*  CALCIUM 8.5* 7.1* 8.3*  MG  --   --  2.1  PHOS  --   --  2.6   Liver Function Tests: Recent Labs  Lab 08/13/23 0614 08/15/23 0505  AST 27 45*  ALT 15 22  ALKPHOS 68 53  BILITOT 1.0 0.8  PROT 8.1 6.2*  ALBUMIN 3.9 2.9*   CBG: Recent Labs  Lab 08/14/23 1232 08/14/23 1756 08/14/23 2151 08/15/23 0806 08/15/23 1156  GLUCAP 119* 107* 118* 112* 166*    Discharge time spent: greater than 30 minutes.  Signed: Debarah Crape, DO [facility] 08/15/2023

## 2023-08-15 NOTE — Hospital Course (Addendum)
Alex Smith is 88 y.o. male with myelodysplasia, Alzheimer's dementia, hypertension, BPH, CKD stage IIIa, insulin-dependent diabetes, who was sent from his nursing home for evaluation of vomiting and diarrhea.  Temperature in the ED 100.3, tachycardic to 103, O2 sat 93% on room air.  Blood work revealed WBC 4.5, lactic acid 5.0, procalcitonin 6.4.  CT abdomen pelvis chest showed bilateral lower lobe infiltrates, concerning for infection.  Patient was started on broad-spectrum antibiotics.  He was admitted for observation. Patient improved significantly.  He had no further vomiting or diarrhea.  He remained afebrile.  He did endorse some coughing and upper respiratory signs and symptoms and thus he was treated for community-acquired pneumonia with renally dosed Levaquin. By evaluation on 12/27 patient had clinically improved.  His son is at bedside who reported that the patient seemed back to baseline.  Patient is chronically leukopenic and thrombocytopenic due to his myelodysplasia, though his labs were trending down on day of discharge.  I reached out directly to his oncologist who plans to see the patient in the clinic early next week to follow-up on these levels.  Patient will need to complete a course of Levaquin for his community-acquired pneumonia.  He is being discharged back to his assisted living facility with home health physical therapy.  Care was discussed directly with the patient as well as his son who is at bedside for evaluation.  They endorsed understanding.

## 2023-08-15 NOTE — [place] FL2 (Signed)
[place] MEDICAID FL2 LEVEL OF CARE FORM     IDENTIFICATION  Patient Name: Alex Smith Birthdate: 1934/11/14 Sex: male Admission Date (Current Location): 08/13/2023  [facility] and [place] Number:  Chiropodist and Address:  [facility], [address] [place], [place] [address]      Provider Number: [id number]  Attending Physician Name and Address:  Debarah Crape, DO  Relative Name and Phone Number:  Dredon, Barrett (Son)  [phone] (    Current Level of Care: Hospital Recommended Level of Care:   Prior Approval Number:    Date Approved/Denied:   PASRR Number: [id number]  Discharge Plan: SNF    Current Diagnoses: Patient Active Problem List   Diagnosis Date Noted   Aspiration pneumonia (HCC) 08/13/2023   Sepsis (HCC) 08/13/2023   Edema of lower extremity 12/25/2022   Chronic kidney disease due to hypertension 12/25/2022   Normocytic anemia 11/27/2022   Pancytopenia (HCC) 11/21/2022   Metabolic acidosis 11/21/2022   Overweight (BMI 25.0-29.9) 11/20/2022   Acute renal failure superimposed on stage 3a chronic kidney disease (HCC) 11/19/2022   Acute respiratory failure with hypoxia (HCC) 04/16/2022   Generalized weakness 04/16/2022   Chronic kidney disease, stage 3b (HCC) 04/16/2022   COVID-19 04/10/2022   Hypercholesterolemia 11/12/2021   Hyperglycemia due to type 2 diabetes mellitus (HCC) 11/12/2021   Closed compression fracture of L2 lumbar vertebra, initial encounter (HCC) 08/18/2021   Alzheimer's dementia (HCC) 08/18/2021   Acute lower UTI leading to weakness and dehydration 08/18/2021   Laceration of head 08/18/2021   Hypoglycemia 06/29/2021   Severe sepsis (HCC) 05/04/2021   CAP (community acquired pneumonia) 05/02/2021   Vitamin D deficiency 03/10/2019   Depressed mood 03/10/2019   Stage 3a chronic kidney disease (CKD) (HCC) 11/14/2017   Goals of care, counseling/discussion 05/27/2017   Pancytopenia due to  antineoplastic chemotherapy (HCC) 11/17/2016   Chemotherapy-induced neutropenia (HCC) 09/22/2016   Urinary incontinence 08/02/2016   Systolic murmur 05/28/2016   Myelodysplastic syndrome, low grade (HCC) 02/05/2016   Multiple myeloma not having achieved remission (HCC) 02/05/2016   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   Myelodysplasia present in bone marrow (HCC) 11/03/2015   Advanced care planning/counseling discussion 05/22/2015   Insomnia 03/03/2015   Monoclonal gammopathy 02/03/2015   Macrocytic anemia 02/03/2015   Obesity (BMI 30-39.9) 10/12/2014   History of nonmelanoma skin cancer 10/10/2014   Medicare annual wellness visit, subsequent 04/12/2014   Health maintenance examination 04/12/2014   Hairy cell leukemia, in remission (HCC)    B12 deficiency    BPH (benign prostatic hyperplasia)    Essential hypertension    GERD (gastroesophageal reflux disease)    Dyslipidemia    Type 2 diabetes mellitus with stage 3 chronic kidney disease (HCC)    Diverticulosis    Macular hole 07/07/2012    Orientation RESPIRATION BLADDER Height & Weight     Self, Place  Normal Continent Weight: 198 lb (89.8 kg) Height:  5\' 7"  (170.2 cm)  BEHAVIORAL SYMPTOMS/MOOD NEUROLOGICAL BOWEL NUTRITION STATUS      Continent    AMBULATORY STATUS COMMUNICATION OF NEEDS Skin   Limited Assist Verbally Normal                       Personal Care Assistance Level of Assistance  Bathing, Dressing, Feeding Bathing Assistance: Limited assistance Feeding assistance: Limited assistance Dressing Assistance: Limited assistance     Functional Limitations Info  Sight, Hearing, Speech Sight Info: Impaired Hearing Info: Adequate Speech Info:  Adequate    SPECIAL CARE FACTORS FREQUENCY  PT (By licensed PT), OT (By licensed OT)     PT Frequency: 3 times a week OT Frequency: 3 times a week            Contractures      Additional Factors Info  Code Status, Allergies Code Status Info: DNR Allergies  Info: Rituximab  Blood-group Specific Substance  Primaxin (Imipenem)  Voriconazole  Sulfa Antibiotics  Sulfacetamide Sodium           Current Medications (08/15/2023):  This is the current hospital active medication list Current Facility-Administered Medications  Medication Dose Route Frequency Provider Last Rate Last Admin   amLODipine (NORVASC) tablet 10 mg  10 mg Oral Daily Mikey College T, MD   10 mg at 08/15/23 1016   aspirin EC tablet 81 mg  81 mg Oral Daily Mikey College T, MD   81 mg at 08/15/23 1016   donepezil (ARICEPT) tablet 5 mg  5 mg Oral Daily Mikey College T, MD   5 mg at 08/15/23 1016   enoxaparin (LOVENOX) injection 40 mg  40 mg Subcutaneous Q24H Mikey College T, MD   40 mg at 08/15/23 1017   famotidine (PEPCID) tablet 20 mg  20 mg Oral Daily Mikey College T, MD   20 mg at 08/15/23 1016   insulin aspart (novoLOG) injection 0-9 Units  0-9 Units Subcutaneous TID WC Mikey College T, MD   2 Units at 08/15/23 1221   insulin glargine-yfgn (SEMGLEE) injection 15 Units  15 Units Subcutaneous Daily Mikey College T, MD   15 Units at 08/14/23 1243   levofloxacin (LEVAQUIN) tablet 750 mg  750 mg Oral Q48H Dezii, Alexandra, DO   750 mg at 08/15/23 1016   loperamide (IMODIUM) capsule 2 mg  2 mg Oral PRN Mikey College T, MD       losartan (COZAAR) tablet 25 mg  25 mg Oral Daily Mikey College T, MD   25 mg at 08/15/23 1016   melatonin tablet 10 mg  10 mg Oral QHS Mikey College T, MD   10 mg at 08/14/23 2231   ondansetron (ZOFRAN) tablet 4 mg  4 mg Oral Q6H PRN Mikey College T, MD       Or   ondansetron [facility]) injection 4 mg  4 mg Intravenous Q6H PRN Mikey College T, MD       pantoprazole (PROTONIX) EC tablet 40 mg  40 mg Oral BID Mikey College T, MD   40 mg at 08/15/23 1016   pravastatin (PRAVACHOL) tablet 20 mg  20 mg Oral Daily Mikey College T, MD   20 mg at 08/15/23 1016   sertraline (ZOLOFT) tablet 25 mg  25 mg Oral Daily Mikey College T, MD   25 mg at 08/15/23 1016   tamsulosin (FLOMAX) capsule 0.4 mg  0.4 mg  Oral Daily Mikey College T, MD   0.4 mg at 08/15/23 1016   torsemide (DEMADEX) tablet 20 mg  20 mg Oral Daily Mikey College T, MD   20 mg at 08/15/23 1016   traZODone (DESYREL) tablet 25 mg  25 mg Oral QHS Mikey College T, MD   25 mg at 08/14/23 2231   Facility-Administered Medications Ordered in Other Encounters  Medication Dose Route Frequency Provider Last Rate Last Admin   heparin lock flush 100 unit/mL  500 Units Intravenous Once Corcoran, Melissa C, MD       luspatercept-aamt (REBLOZYL) subcutaneous injection 100 mg  100 mg  Subcutaneous Q21 days Creig Hines, MD   100 mg at 08/27/22 1206   luspatercept-aamt (REBLOZYL) subcutaneous injection 100 mg  100 mg Subcutaneous Q21 days Creig Hines, MD   100 mg at 01/09/23 1428   sodium chloride 0.9 % injection 10 mL  10 mL Intravenous PRN Nelva Nay C, MD   10 mL at 03/03/15 0903   sodium chloride 0.9 % injection 10 mL  10 mL Intracatheter PRN Nelva Nay C, MD   10 mL at 03/10/15 1410   sodium chloride flush (NS) 0.9 % injection 10 mL  10 mL Intravenous PRN Rosey Bath, MD   10 mL at 07/23/18 1517     Discharge Medications: Please see discharge summary for a list of discharge medications.   STOP taking these medications     clindamycin 300 MG capsule Commonly known as: CLEOCIN    QUEtiapine 25 MG tablet Commonly known as: SEROQUEL           TAKE these medications     acetaminophen 500 MG tablet Commonly known as: TYLENOL Take 2 tablets (1,000 mg total) by mouth 2 (two) times daily.    albuterol 108 (90 Base) MCG/ACT inhaler Commonly known as: VENTOLIN HFA Inhale 2 puffs into the lungs every 6 (six) hours as needed for wheezing or shortness of breath.    alum & mag hydroxide-simeth 200-200-20 MG/5ML suspension Commonly known as: MAALOX/MYLANTA Take 30 mLs by mouth every 4 (four) hours as needed for indigestion or heartburn.    amLODipine 10 MG tablet Commonly known as: NORVASC Take 1 tablet (10 mg total)  by mouth daily.    aspirin EC 81 MG tablet Take 81 mg by mouth daily.    cyanocobalamin 1000 MCG tablet Commonly known as: VITAMIN B12 Take 1 tablet (1,000 mcg total) by mouth daily.    donepezil 5 MG disintegrating tablet Commonly known as: ARICEPT ODT Take 5 mg by mouth daily. What changed: Another medication with the same name was removed. Continue taking this medication, and follow the directions you see here.    famotidine 20 MG tablet Commonly known as: PEPCID Take 20 mg by mouth daily.    insulin glargine 100 UNIT/ML Solostar Pen Commonly known as: LANTUS Inject 6 Units into the skin at bedtime. What changed: how much to take    insulin lispro 100 UNIT/ML KwikPen Commonly known as: HUMALOG Inject 2 Units into the skin 3 (three) times daily before meals. Per sliding scale    insulin lispro 100 UNIT/ML KwikPen Commonly known as: HUMALOG Inject into the skin. SLIDING SCALE    levofloxacin 750 MG tablet Commonly known as: LEVAQUIN Take 1 tablet (750 mg total) by mouth every other day for 6 days. Start taking on: August 17, 2023    loperamide 2 MG tablet Commonly known as: IMODIUM A-D Take 2 mg by mouth 4 (four) times daily as needed for diarrhea or loose stools.    losartan 25 MG tablet Commonly known as: Cozaar Take 1 tablet (25 mg total) by mouth daily.    magnesium hydroxide 400 MG/5ML suspension Commonly known as: MILK OF MAGNESIA Take 5 mLs by mouth daily as needed for mild constipation.    melatonin 5 MG Tabs Take 10 mg by mouth at bedtime.    multivitamin with minerals Tabs tablet Take 1 tablet by mouth daily.    pantoprazole 40 MG tablet Commonly known as: PROTONIX Take 1 tablet (40 mg total) by mouth 2 (two) times daily.  pravastatin 20 MG tablet Commonly known as: PRAVACHOL Take 20 mg by mouth daily.    sertraline 25 MG tablet Commonly known as: ZOLOFT Take 25 mg by mouth daily.    tamsulosin 0.4 MG Caps capsule Commonly known as:  FLOMAX TAKE 1 CAPSULE BY MOUTH EVERY DAY    torsemide 20 MG tablet Commonly known as: DEMADEX Take 20 mg by mouth daily.    traZODone 50 MG tablet Commonly known as: DESYREL Take 25 mg by mouth at bedtime.    vitamin E 180 MG (400 UNITS) capsule Take 400 Units by mouth daily.       Relevant Imaging Results:  Relevant Lab Results:   Additional Information SS#: [id number]. From [facility].  Raena Pau Genelle Gather, LCSW

## 2023-08-15 NOTE — Progress Notes (Signed)
PHARMACY NOTE:  ANTIMICROBIAL RENAL DOSAGE ADJUSTMENT  Current antimicrobial regimen includes a mismatch between antimicrobial dosage and estimated renal function.  As per policy approved by the Pharmacy & Therapeutics and Medical Executive Committees, the antimicrobial dosage will be adjusted accordingly.  Current antimicrobial dosage: LVQ 750 mg PO daily  Indication: PNA  Renal Function:  Estimated Creatinine Clearance: 34 mL/min (A) (by C-G formula based on SCr of 1.67 mg/dL (H)).    Antimicrobial dosage has been changed to:  LVQ 750 mg PO q48h  Thank you for allowing pharmacy to be a part of this patient's care.  Tressie Ellis, [facility] 08/15/2023 7:41 AM

## 2023-08-15 NOTE — TOC Transition Note (Signed)
Transition of Care [facility]) - Discharge Note   Patient Details  Name: Alex Smith MRN: [id number] Date of Birth: [birth year]  [facility] CM/SW Contact:  Allena Katz, LCSW Phone Number: 08/15/2023, 1:32 PM   Clinical Narrative:   Pt discharging back to the [facility]. Dustin notified. Fl2 and summary to be sent with pt. Son will be transporting. Daughter wants to use the [facility] recommends which is [facility] CSW sent message to sheila with [facility] to be sure they are able to take the patient.     Final next level of care: Assisted Living Barriers to Discharge: Barriers Resolved   Patient Goals and CMS Choice Patient states their goals for this hospitalization and ongoing recovery are:: go back to ALF CMS Medicare.gov Compare Post Acute Care list provided to:: Patient Represenative (must comment) (daughter)        Discharge Placement                Patient to be transferred to facility by: son Name of family member notified: daughter karen    Discharge Plan and Services Additional resources added to the After Visit Summary for                            [facility] Arranged: PT, OT [facility] Agency: [facility] Date [facility] Agency Contacted: 08/15/23   Representative spoke with at [facility] Agency: sheila  Social Drivers of Health (SDOH) Interventions SDOH Screenings   Food Insecurity: No Food Insecurity (08/15/2023)  Housing: Unknown (08/15/2023)  Transportation Needs: No Transportation Needs (08/15/2023)  Utilities: Not At Risk (08/15/2023)  Depression (PHQ2-9): Medium Risk (03/09/2019)  Tobacco Use: Medium Risk (08/14/2023)     Readmission Risk Interventions    04/13/2022    2:31 PM 08/19/2021   10:21 AM 05/11/2021   10:42 AM  Readmission Risk Prevention Plan  Transportation Screening Complete Complete Complete  PCP or Specialist Appt within 3-5 Days Complete    HRI or Home Care Consult Complete    Social Work Consult for Recovery Care  Planning/Counseling Complete    Palliative Care Screening Complete    Medication Review [employer]) Complete Complete Complete  PCP or Specialist appointment within 3-5 days of discharge  Complete Complete  HRI or Home Care Consult  Complete Complete  SW Recovery Care/Counseling Consult  Complete Complete  Palliative Care Screening  Not Applicable Complete  Skilled Nursing Facility  Complete Complete

## 2023-08-18 LAB — CULTURE, BLOOD (ROUTINE X 2)
Culture: NO GROWTH
Culture: NO GROWTH
Special Requests: ADEQUATE

## 2023-08-21 ENCOUNTER — Inpatient Hospital Stay: Payer: PPO

## 2023-08-21 DIAGNOSIS — Z794 Long term (current) use of insulin: Secondary | ICD-10-CM | POA: Diagnosis not present

## 2023-08-21 DIAGNOSIS — I129 Hypertensive chronic kidney disease with stage 1 through stage 4 chronic kidney disease, or unspecified chronic kidney disease: Secondary | ICD-10-CM | POA: Diagnosis not present

## 2023-08-21 DIAGNOSIS — E1122 Type 2 diabetes mellitus with diabetic chronic kidney disease: Secondary | ICD-10-CM | POA: Diagnosis not present

## 2023-08-21 DIAGNOSIS — E1165 Type 2 diabetes mellitus with hyperglycemia: Secondary | ICD-10-CM | POA: Diagnosis not present

## 2023-08-21 DIAGNOSIS — N1831 Chronic kidney disease, stage 3a: Secondary | ICD-10-CM | POA: Diagnosis not present

## 2023-08-21 DIAGNOSIS — F0390 Unspecified dementia without behavioral disturbance: Secondary | ICD-10-CM | POA: Diagnosis not present

## 2023-08-21 DIAGNOSIS — Z51A Encounter for sepsis aftercare: Secondary | ICD-10-CM | POA: Diagnosis not present

## 2023-08-21 DIAGNOSIS — J690 Pneumonitis due to inhalation of food and vomit: Secondary | ICD-10-CM | POA: Diagnosis not present

## 2023-08-21 DIAGNOSIS — E11319 Type 2 diabetes mellitus with unspecified diabetic retinopathy without macular edema: Secondary | ICD-10-CM | POA: Diagnosis not present

## 2023-08-21 DIAGNOSIS — G309 Alzheimer's disease, unspecified: Secondary | ICD-10-CM | POA: Diagnosis not present

## 2023-08-27 ENCOUNTER — Inpatient Hospital Stay: Payer: PPO

## 2023-09-02 ENCOUNTER — Other Ambulatory Visit: Payer: Self-pay

## 2023-09-02 DIAGNOSIS — N1831 Chronic kidney disease, stage 3a: Secondary | ICD-10-CM

## 2023-09-03 ENCOUNTER — Inpatient Hospital Stay: Payer: PPO

## 2023-09-03 ENCOUNTER — Inpatient Hospital Stay (HOSPITAL_BASED_OUTPATIENT_CLINIC_OR_DEPARTMENT_OTHER): Payer: PPO | Admitting: Oncology

## 2023-09-03 ENCOUNTER — Inpatient Hospital Stay: Payer: PPO | Attending: Nurse Practitioner

## 2023-09-03 ENCOUNTER — Encounter: Payer: Self-pay | Admitting: Oncology

## 2023-09-03 VITALS — BP 171/73 | HR 66 | Temp 98.6°F | Resp 18 | Wt 195.0 lb

## 2023-09-03 DIAGNOSIS — D472 Monoclonal gammopathy: Secondary | ICD-10-CM

## 2023-09-03 DIAGNOSIS — Z79899 Other long term (current) drug therapy: Secondary | ICD-10-CM | POA: Insufficient documentation

## 2023-09-03 DIAGNOSIS — N1831 Chronic kidney disease, stage 3a: Secondary | ICD-10-CM | POA: Insufficient documentation

## 2023-09-03 DIAGNOSIS — Z5111 Encounter for antineoplastic chemotherapy: Secondary | ICD-10-CM | POA: Diagnosis present

## 2023-09-03 DIAGNOSIS — D46Z Other myelodysplastic syndromes: Secondary | ICD-10-CM

## 2023-09-03 LAB — FERRITIN: Ferritin: 73 ng/mL (ref 24–336)

## 2023-09-03 LAB — CMP (CANCER CENTER ONLY)
ALT: 13 U/L (ref 0–44)
AST: 16 U/L (ref 15–41)
Albumin: 3.5 g/dL (ref 3.5–5.0)
Alkaline Phosphatase: 69 U/L (ref 38–126)
Anion gap: 7 (ref 5–15)
BUN: 21 mg/dL (ref 8–23)
CO2: 22 mmol/L (ref 22–32)
Calcium: 8.3 mg/dL — ABNORMAL LOW (ref 8.9–10.3)
Chloride: 106 mmol/L (ref 98–111)
Creatinine: 1.52 mg/dL — ABNORMAL HIGH (ref 0.61–1.24)
GFR, Estimated: 44 mL/min — ABNORMAL LOW (ref >60)
Glucose, Bld: 213 mg/dL — ABNORMAL HIGH (ref 70–99)
Potassium: 3.9 mmol/L (ref 3.5–5.1)
Sodium: 135 mmol/L (ref 135–145)
Total Bilirubin: 0.7 mg/dL (ref 0.0–1.2)
Total Protein: 7.2 g/dL (ref 6.5–8.1)

## 2023-09-03 LAB — CBC WITH DIFFERENTIAL (CANCER CENTER ONLY)
Abs Immature Granulocytes: 0.01 10*3/uL (ref 0.00–0.07)
Basophils Absolute: 0 10*3/uL (ref 0.0–0.1)
Basophils Relative: 1 %
Eosinophils Absolute: 0.2 10*3/uL (ref 0.0–0.5)
Eosinophils Relative: 5 %
HCT: 26.6 % — ABNORMAL LOW (ref 39.0–52.0)
Hemoglobin: 8.5 g/dL — ABNORMAL LOW (ref 13.0–17.0)
Immature Granulocytes: 0 %
Lymphocytes Relative: 25 %
Lymphs Abs: 0.9 10*3/uL (ref 0.7–4.0)
MCH: 34.3 pg — ABNORMAL HIGH (ref 26.0–34.0)
MCHC: 32 g/dL (ref 30.0–36.0)
MCV: 107.3 fL — ABNORMAL HIGH (ref 80.0–100.0)
Monocytes Absolute: 0.2 10*3/uL (ref 0.1–1.0)
Monocytes Relative: 7 %
Neutro Abs: 2.1 10*3/uL (ref 1.7–7.7)
Neutrophils Relative %: 62 %
Platelet Count: 91 10*3/uL — ABNORMAL LOW (ref 150–400)
RBC: 2.48 MIL/uL — ABNORMAL LOW (ref 4.22–5.81)
RDW: 16.6 % — ABNORMAL HIGH (ref 11.5–15.5)
WBC Count: 3.4 10*3/uL — ABNORMAL LOW (ref 4.0–10.5)
nRBC: 0 % (ref 0.0–0.2)

## 2023-09-03 LAB — IRON AND TIBC
Iron: 61 ug/dL (ref 45–182)
Saturation Ratios: 21 % (ref 17.9–39.5)
TIBC: 288 ug/dL (ref 250–450)
UIBC: 227 ug/dL

## 2023-09-03 MED ORDER — LUSPATERCEPT-AAMT 75 MG ~~LOC~~ SOLR
100.0000 mg | SUBCUTANEOUS | Status: DC
Start: 2023-09-03 — End: 2023-09-03
  Administered 2023-09-03: 100 mg via SUBCUTANEOUS
  Filled 2023-09-03: qty 2

## 2023-09-04 ENCOUNTER — Encounter: Payer: Self-pay | Admitting: Hematology and Oncology

## 2023-09-04 NOTE — Progress Notes (Signed)
Hematology/Oncology Consult note [facility]  [phone] [phone]  Patient Care Team: [facility], [facility] Making as PCP - General (Geriatric Medicine) Alex Nunnery, MD as Referring Physician (Hematology and Oncology) Alex Nunnery, MD as Referring Physician (Hematology and Oncology) Alex Hines, MD as Consulting Physician (Oncology)   Name of the patient: Alex Smith  [id number]  05-Dec-1934   Date of visit: 09/04/23  Diagnosis- History of hairy cell leukemia 2.  Smoldering multiple myeloma under observation 3.  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 Merlene Smith and is transitioning his care to me.  He has following issues:   1.  Hairy cell leukemia that was initially diagnosed on bone marrow biopsy in June 2016.  Bone marrow biopsy at that time showed extensive marrow involvement with recurrent hairy cell leukemia approximately 80% of the cells in the core 10% monoclonal plasma cell infiltrate compatible with plasma cell neoplasm.  Peripheral smear revealed leukopenia with a white count of 1900 compatible with hairy cell leukemia. 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  FISH studies for MDS were negative.  Cytogenetics were normal (46, XY).  He has last received cladribine for this back in July 2016   2.  Smoldering multiple myeloma: Bone marrow biopsy in March 2017 revealed persistent plasma cell neoplasm with monoclonal plasma cells estimated at 20 to 30%.  No morphologic evidence of residual hairy cell leukemia.Marrow was normocellular for age with relative erythroid hyperplasia, relative myeloid hypoplasia, mild dyspoiesis, adequate megakaryocytes and no increased blasts.  There was  no significant increase in marrow reticulin fibers.  Iron was present.  FISH studies for MDS were negative.  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  Cytogenetics were normal (46, XY).  Last bone marrow biopsy was in October 2018 which showed variably cellular marrow 10 to 50% with kappa restricted plasmacytosis of 10 to 15%.  No evidence of leukemia lymphoma or high-grade dysplasia.  Normal cytogenetics.  He has also been followed by Dr. Porfirio Smith at [facility].  He has received Revlimid and Decadron in the past in 2017 which was subsequently stopped.  PET scan in July 2017 showed no hypermetabolic adenopathy or focal skeletal lesions.   3.  MDS: He has received a trial of Procrit in the past in 2017 but apparently did not respond to it.  He has been getting periodic blood transfusions to maintain his hemoglobin closer to 9 as he is symptomatic when his hemoglobin is less than 9.  He has been getting about 2-3 blood transfusions during the year.  He does have some mild leukopenia/neutropenia and is on acyclovir prophylaxis.  He was getting weekly G-CSF when his ANC is less than 1.5.  He is not presently on G-CSF.  He is currently on Luspatercept for his anemia.   4.  Also has history of B12 deficiency for which he is on oral B12.      Interval history- Patient was admitted to the hospital in December 2024 for sepsis possibly secondary to pneumonia requiring antibiotics.  Presently he is doing better and is back to [facility] facility.  ECOG PS- 3 Pain scale- 0   Review of systems- Review of Systems  Constitutional:  Positive for  malaise/fatigue. Negative for chills, fever and weight loss.  HENT:  Negative for congestion, ear discharge and nosebleeds.   Eyes:  Negative for blurred vision.  Respiratory:  Negative for cough, hemoptysis, sputum production, shortness of breath and wheezing.   Cardiovascular:  Negative for chest pain, palpitations, orthopnea and  claudication.  Gastrointestinal:  Negative for abdominal pain, blood in stool, constipation, diarrhea, heartburn, melena, nausea and vomiting.  Genitourinary:  Negative for dysuria, flank pain, frequency, hematuria and urgency.  Musculoskeletal:  Negative for back pain, joint pain and myalgias.  Skin:  Negative for rash.  Neurological:  Negative for dizziness, tingling, focal weakness, seizures, weakness and headaches.  Endo/Heme/Allergies:  Does not bruise/bleed easily.  Psychiatric/Behavioral:  Negative for depression and suicidal ideas. The patient does not have insomnia.       Allergies  Allergen Reactions   Rituximab Rash    Chest tightness Chest tightness   Blood-Group Specific Substance Other (See Comments)    Had a post transfusion reaction of red blood cells; NOW REQUIRES WASHED BLOOD CELLS Had a post transfusion reaction of red blood cells; NOW REQUIRES WASHED BLOOD CELLS   Primaxin [Imipenem] Other (See Comments)    Possible allergy Tolerates cephalosporins   Voriconazole Other (See Comments)   Sulfa Antibiotics Itching and Rash   Sulfacetamide Sodium Itching and Rash     Past Medical History:  Diagnosis Date   Actinic keratosis    Alzheimer's dementia (HCC) 08/18/2021   Anemia    B12 deficiency    Basal cell carcinoma 03/30/2018   left ant nasal ala   Basal cell carcinoma 06/25/2023   R cheek, schedule for [facility]   Basal cell carcinoma of face 01/19/2013   right distal dorsum nose   BPH (benign prostatic hypertrophy)    followed by urology, discharged (Dr. Lonna Smith)   CAP (community acquired pneumonia) 02/15/2015   CKD stage 3 due to type 2 diabetes mellitus (HCC) 11/14/2017   Colon polyps    Diverticulosis    Dysplastic nevus 10/27/2006   right med calf   GERD (gastroesophageal reflux disease)    Hairy cell leukemia (HCC) 2006   recurrent, seizure on rituxan, now on cladribine Alex Smith)   History of pneumonia 2000's   "once" (07/07/2012)   History of  shingles    HLD (hyperlipidemia)    Hypertension    Pneumonia    Shortness of breath dyspnea    Squamous cell carcinoma in situ 07/05/2020   Left lat. pretibial. 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 EDC 09/07/2020   Squamous cell carcinoma of skin 04/03/2020   Left cheek. WD SCC   Systolic murmur 05/28/2016   Type 2 diabetes, controlled, with retinopathy (HCC)      Past Surgical History:  Procedure Laterality Date   25 GAUGE PARS PLANA VITRECTOMY WITH 20 GAUGE MVR PORT FOR MACULAR HOLE  07/07/2012   Procedure: 25 GAUGE PARS PLANA VITRECTOMY WITH 20 GAUGE MVR PORT FOR MACULAR HOLE;  Surgeon: Sherrie George, MD;  Location: [facility] OR;  Service: Ophthalmology;  Laterality: Left;   BONE MARROW BIOPSY  2016   CARDIOVASCULAR STRESS TEST  2013   treadmill - no evidence ischemia, EF 61%   CATARACT EXTRACTION W/ INTRAOCULAR LENS  IMPLANT, BILATERAL  ~ 2010   COLONOSCOPY  2014   Elliot WNL no rpt needed, h/o polyps   EYE SURGERY Left 06/2012   laser surgery   GAS INSERTION  07/07/2012   Procedure:  INSERTION OF GAS;  Surgeon: Sherrie George, MD;  Location: [facility] OR;  Service: Ophthalmology;  Laterality: Left;  [id number]   PERIPHERAL VASCULAR CATHETERIZATION N/A 02/23/2015   Procedure: Shelda Pal Cath Insertion;  Surgeon: Annice Needy, MD;  Location: [facility] INVASIVE CV LAB;  Service: Cardiovascular;  Laterality: N/A;   PORTA CATH REMOVAL N/A 02/16/2020   Procedure: PORTA CATH REMOVAL;  Surgeon: Annice Needy, MD;  Location: [facility] INVASIVE CV LAB;  Service: Cardiovascular;  Laterality: N/A;   SERUM PATCH  07/07/2012   Procedure: SERUM PATCH;  Surgeon: Sherrie George, MD;  Location: [facility] OR;  Service: Ophthalmology;  Laterality: Left;   SKIN CANCER EXCISION     "all over my face" (07/07/2012)    Social History   Socioeconomic History   Marital status: Married    Spouse name: Not on file   Number of children: Not on file   Years of education: Not on file    Highest education level: Not on file  Occupational History   Not on file  Tobacco Use   Smoking status: Former    Current packs/day: 0.00    Average packs/day: 2.0 packs/day for 25.0 years (50.0 ttl pk-yrs)    Types: Cigarettes    Start date: 02/07/1944    Quit date: 02/06/1969    Years since quitting: 54.6   Smokeless tobacco: Never   Tobacco comments:    07/07/2012 "stopped smoking ~ 40 yr ago; smoked 20-9yr"  Vaping Use   Vaping status: Never Used  Substance and Sexual Activity   Alcohol use: Not Currently   Drug use: No   Sexual activity: Never  Other Topics Concern   Not on file  Social History Narrative   Lives at home alone. 1 cat   Occupation: was Tree surgeon, works part time for [employer]   Activity: likes to golf   Diet: moderate water, fruits/vegetables daily   Social Drivers of Corporate investment banker Strain: Not on file  Food Insecurity: No Food Insecurity (08/15/2023)   Hunger Vital Sign    Worried About Running Out of Food in the Last Year: Never true    Ran Out of Food in the Last Year: Never true  Transportation Needs: No Transportation Needs (08/15/2023)   PRAPARE - Administrator, Civil Service (Medical): No    Lack of Transportation (Non-Medical): No  Physical Activity: Not on file  Stress: Not on file  Social Connections: Not on file  Intimate Partner Violence: Not At Risk (08/15/2023)   Humiliation, Afraid, Rape, and Kick questionnaire    Fear of Current or Ex-Partner: No    Emotionally Abused: No    Physically Abused: No    Sexually Abused: No    Family History  Problem Relation Age of Onset   Dementia Mother    Heart failure Father 64   Cancer Sister        breast   Diabetes Paternal Uncle    Diabetes Paternal Aunt    CAD Brother 72       MI   Stroke Neg Hx      Current Outpatient Medications:    acetaminophen (TYLENOL) 500 MG tablet, Take 2 tablets (1,000 mg total) by mouth 2 (two) times daily., Disp: 120  tablet, Rfl: 0   albuterol (VENTOLIN HFA) 108 (90 Base) MCG/ACT inhaler, Inhale 2 puffs into the lungs every 6 (six) hours as needed for wheezing or shortness of breath., Disp: , Rfl:  alum & mag hydroxide-simeth (MAALOX/MYLANTA) 200-200-20 MG/5ML suspension, Take 30 mLs by mouth every 4 (four) hours as needed for indigestion or heartburn., Disp: 355 mL, Rfl: 0   amLODipine (NORVASC) 10 MG tablet, Take 1 tablet (10 mg total) by mouth daily., Disp: , Rfl:    aspirin EC 81 MG tablet, Take 81 mg by mouth daily., Disp: , Rfl:    donepezil (ARICEPT ODT) 5 MG disintegrating tablet, Take 5 mg by mouth daily., Disp: , Rfl:    famotidine (PEPCID) 20 MG tablet, Take 20 mg by mouth daily., Disp: , Rfl:    insulin glargine (LANTUS) 100 UNIT/ML Solostar Pen, Inject 6 Units into the skin at bedtime. (Patient taking differently: Inject 14 Units into the skin at bedtime.), Disp: 15 mL, Rfl: 11   insulin lispro (HUMALOG) 100 UNIT/ML KwikPen, Inject 2 Units into the skin 3 (three) times daily before meals. Per sliding scale (Patient not taking: Reported on 08/13/2023), Disp: 15 mL, Rfl: 11   insulin lispro (HUMALOG) 100 UNIT/ML KwikPen, Inject into the skin. SLIDING SCALE, Disp: , Rfl:    loperamide (IMODIUM A-D) 2 MG tablet, Take 2 mg by mouth 4 (four) times daily as needed for diarrhea or loose stools., Disp: , Rfl:    losartan (COZAAR) 25 MG tablet, Take 1 tablet (25 mg total) by mouth daily., Disp: 30 tablet, Rfl: 0   magnesium hydroxide (MILK OF MAGNESIA) 400 MG/5ML suspension, Take 5 mLs by mouth daily as needed for mild constipation., Disp: , Rfl:    melatonin 5 MG TABS, Take 10 mg by mouth at bedtime., Disp: , Rfl:    Multiple Vitamin (MULTIVITAMIN WITH MINERALS) TABS tablet, Take 1 tablet by mouth daily., Disp: , Rfl:    pantoprazole (PROTONIX) 40 MG tablet, Take 1 tablet (40 mg total) by mouth 2 (two) times daily., Disp: , Rfl:    pravastatin (PRAVACHOL) 20 MG tablet, Take 20 mg by mouth daily., Disp: ,  Rfl:    sertraline (ZOLOFT) 25 MG tablet, Take 25 mg by mouth daily., Disp: , Rfl:    tamsulosin (FLOMAX) 0.4 MG CAPS capsule, TAKE 1 CAPSULE BY MOUTH EVERY DAY, Disp: 30 capsule, Rfl: 3   torsemide (DEMADEX) 20 MG tablet, Take 20 mg by mouth daily., Disp: , Rfl:    traZODone (DESYREL) 50 MG tablet, Take 25 mg by mouth at bedtime., Disp: , Rfl:    vitamin B-12 (CYANOCOBALAMIN) 1000 MCG tablet, Take 1 tablet (1,000 mcg total) by mouth daily., Disp: 90 tablet, Rfl: 3   vitamin E 400 UNIT capsule, Take 400 Units by mouth daily., Disp: , Rfl:  No current facility-administered medications for this visit.  Facility-Administered Medications Ordered in Other Visits:    heparin lock flush 100 unit/mL, 500 Units, Intravenous, Once, Corcoran, Melissa C, MD   luspatercept-aamt (REBLOZYL) subcutaneous injection 100 mg, 100 mg, Subcutaneous, Q21 days, Alex Hines, MD, 100 mg at 08/27/22 1206   luspatercept-aamt (REBLOZYL) subcutaneous injection 100 mg, 100 mg, Subcutaneous, Q21 days, Alex Hines, MD, 100 mg at 01/09/23 1428   sodium chloride 0.9 % injection 10 mL, 10 mL, Intravenous, PRN, Alex Smith, Melissa C, MD, 10 mL at 03/03/15 0903   sodium chloride 0.9 % injection 10 mL, 10 mL, Intracatheter, PRN, Alex Smith, Melissa C, MD, 10 mL at 03/10/15 1410   sodium chloride flush (NS) 0.9 % injection 10 mL, 10 mL, Intravenous, PRN, Nelva Nay C, MD, 10 mL at 07/23/18 1517  Physical exam:  Vitals:   09/03/23 1044  BP: (!) 171/73  Pulse: 66  Resp: 18  Temp: 98.6 F (37 C)  TempSrc: Tympanic  SpO2: 100%  Weight: 195 lb (88.5 kg)   Physical Exam Constitutional:      Comments: Ambulates with a walker.  Appears in no acute distress  Cardiovascular:     Rate and Rhythm: Normal rate and regular rhythm.     Heart sounds: Normal heart sounds.  Pulmonary:     Effort: Pulmonary effort is normal.     Breath sounds: Normal breath sounds.  Abdominal:     General: Bowel sounds are normal.      Palpations: Abdomen is soft.  Skin:    General: Skin is warm and dry.  Neurological:     Mental Status: He is alert and oriented to person, place, and time.         Latest Ref Rng & Units 09/03/2023   10:20 AM  CMP  Glucose 70 - 99 mg/dL 161   BUN 8 - 23 mg/dL 21   Creatinine 0.96 - 1.24 mg/dL 0.45   Sodium 409 - 811 mmol/L 135   Potassium 3.5 - 5.1 mmol/L 3.9   Chloride 98 - 111 mmol/L 106   CO2 22 - 32 mmol/L 22   Calcium 8.9 - 10.3 mg/dL 8.3   Total Protein 6.5 - 8.1 g/dL 7.2   Total Bilirubin 0.0 - 1.2 mg/dL 0.7   Alkaline Phos 38 - 126 U/L 69   AST 15 - 41 U/L 16   ALT 0 - 44 U/L 13       Latest Ref Rng & Units 09/03/2023   10:20 AM  CBC  WBC 4.0 - 10.5 K/uL 3.4   Hemoglobin 13.0 - 17.0 g/dL 8.5   Hematocrit 91.4 - 52.0 % 26.6   Platelets 150 - 400 K/uL 91     No images are attached to the encounter.  CT CHEST ABDOMEN PELVIS W CONTRAST Result Date: 08/13/2023 CLINICAL DATA:  Sepsis. EXAM: CT CHEST, ABDOMEN, AND PELVIS WITH CONTRAST TECHNIQUE: Multidetector CT imaging of the chest, abdomen and pelvis was performed following the standard protocol during bolus administration of intravenous contrast. RADIATION DOSE REDUCTION: This exam was performed according to the departmental dose-optimization program which includes automated exposure control, adjustment of the mA and/or kV according to patient size and/or use of iterative reconstruction technique. CONTRAST:  75mL OMNIPAQUE IOHEXOL 300 MG/ML  SOLN COMPARISON:  04/09/2022. FINDINGS: CT CHEST FINDINGS Cardiovascular: Atheromatous calcifications of the coronary arteries and aorta. Pericardial effusion. No aneurysm. Mediastinum/Nodes: No enlarged mediastinal, hilar, or axillary lymph nodes. Unremarkable thyroid and tracheobronchial tree. Stable esophageal wall thickening small hiatal hernia. Lungs/Pleura: Dependent bibasilar subsegmental atelectasis or minimal consolidation. No pneumothorax or pleural effusion. No evidence of  edema. Musculoskeletal: No chest wall mass or suspicious bone lesions identified. Thoracic degenerative disc disease. CT ABDOMEN PELVIS FINDINGS Hepatobiliary: Gallbladder is distended with several calcified stones. No pericholecystic inflammatory changes. No focal hepatic parenchymal abnormalities or biliary ductal dilatation. Pancreas: Fatty atrophy. No focal lesions. No peripancreatic inflammatory changes. Spleen: Calcified granulomata.  No splenomegaly. Adrenals/Urinary Tract: Bilateral renal cysts, largest 4.4 cm on the left. No adrenal lesions. No hydronephrosis or nephrolithiasis. Unremarkable urinary bladder. Stomach/Bowel: No dilated bowel to indicate obstruction. Diffuse colonic diverticulosis. Vascular/Lymphatic: Aortic atherosclerosis. No enlarged abdominal or pelvic lymph nodes. Reproductive: Prominent prostate. Other: Rectus diastasis. Anterior abdominal wall defect containing nondilated bowel loops with no evidence of incarceration or obstruction. Musculoskeletal: L2 compression deformity unchanged from prior study. Thoracolumbosacral degenerative disc  disease. Osteopenia. IMPRESSION: 1. Dependent bibasilar subsegmental atelectasis or minimal consolidation. 2. Stable esophageal wall thickening and small hiatal hernia. 3. Cholelithiasis. 4. Diverticulosis. 5. Abdominal wall defect without evidence of incarceration or bowel obstruction. 6. Prominent prostate. 7. Aortic Atherosclerosis (ICD10-I70.0). Electronically Signed   By: Layla Maw M.D.   On: 08/13/2023 09:02   DG Chest Port 1 View Result Date: 08/13/2023 CLINICAL DATA:  89 year old male with possible sepsis.  Vomiting. EXAM: PORTABLE CHEST 1 VIEW COMPARISON:  Chest radiographs 11/18/2022 and earlier. FINDINGS: Portable AP upright view at 0633 hours. Mildly lordotic positioning. Similar lung volumes. Mediastinal contours are stable and within normal limits. Visualized tracheal air column is within normal limits. No pneumothorax. No  pleural effusion or consolidation. But there is asymmetric reticulonodular peribronchial opacity in the right mid lung. Mild lung base atelectasis and/or scarring not significantly changed. No other acute lung finding. Negative visible bowel gas. No acute osseous abnormality identified. IMPRESSION: 1. New asymmetric reticulonodular peribronchial opacity in the right mid lung. Consider developing bronchopneumonia or mild aspiration in this setting. 2. Stable appearance of lung base atelectasis and/or scarring. No consolidation or pleural effusion. Electronically Signed   By: Odessa Fleming M.D.   On: 08/13/2023 06:43     Assessment and plan- Patient is a 89 y.o. 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  It usually drops to 8-9 when he gets admitted to the hospital.  Presently it is 8.5.  He last received Luspatercept about 4 weeks ago and will receive his next dose today and will continue to get Luspatercept every 3 weeks.  Repeat CBC with differential 6 weeks and 12 weeks and I will see him back in 12 weeks.  Smoldering multiple myeloma: Overall stable and I will repeat myeloma labs in 3 months.   Visit Diagnosis 1. 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 Stage 3a chronic kidney disease (CKD) (HCC)   3. Smoldering multiple myeloma   4. High risk medication use      Dr. Owens Shark, MD, MPH [facility] [phone] 09/04/2023 10:03 AM

## 2023-09-05 DIAGNOSIS — E1122 Type 2 diabetes mellitus with diabetic chronic kidney disease: Secondary | ICD-10-CM | POA: Diagnosis not present

## 2023-09-05 DIAGNOSIS — J690 Pneumonitis due to inhalation of food and vomit: Secondary | ICD-10-CM | POA: Diagnosis not present

## 2023-09-05 DIAGNOSIS — I129 Hypertensive chronic kidney disease with stage 1 through stage 4 chronic kidney disease, or unspecified chronic kidney disease: Secondary | ICD-10-CM | POA: Diagnosis not present

## 2023-09-10 ENCOUNTER — Inpatient Hospital Stay: Payer: PPO

## 2023-09-10 DIAGNOSIS — Z79899 Other long term (current) drug therapy: Secondary | ICD-10-CM | POA: Insufficient documentation

## 2023-09-11 ENCOUNTER — Encounter: Payer: Self-pay | Admitting: Oncology

## 2023-09-11 ENCOUNTER — Encounter: Payer: Self-pay | Admitting: Podiatry

## 2023-09-11 ENCOUNTER — Ambulatory Visit: Payer: PPO | Admitting: Podiatry

## 2023-09-11 VITALS — Ht 67.0 in | Wt 195.0 lb

## 2023-09-11 DIAGNOSIS — M79674 Pain in right toe(s): Secondary | ICD-10-CM

## 2023-09-11 DIAGNOSIS — B351 Tinea unguium: Secondary | ICD-10-CM

## 2023-09-11 DIAGNOSIS — E1122 Type 2 diabetes mellitus with diabetic chronic kidney disease: Secondary | ICD-10-CM

## 2023-09-11 DIAGNOSIS — L84 Corns and callosities: Secondary | ICD-10-CM | POA: Diagnosis not present

## 2023-09-11 DIAGNOSIS — Z794 Long term (current) use of insulin: Secondary | ICD-10-CM | POA: Diagnosis not present

## 2023-09-11 DIAGNOSIS — N183 Chronic kidney disease, stage 3 unspecified: Secondary | ICD-10-CM | POA: Diagnosis not present

## 2023-09-11 DIAGNOSIS — M79675 Pain in left toe(s): Secondary | ICD-10-CM

## 2023-09-16 ENCOUNTER — Emergency Department: Payer: PPO

## 2023-09-16 ENCOUNTER — Other Ambulatory Visit: Payer: Self-pay

## 2023-09-16 DIAGNOSIS — Z794 Long term (current) use of insulin: Secondary | ICD-10-CM | POA: Insufficient documentation

## 2023-09-16 DIAGNOSIS — G309 Alzheimer's disease, unspecified: Secondary | ICD-10-CM | POA: Diagnosis not present

## 2023-09-16 DIAGNOSIS — R059 Cough, unspecified: Secondary | ICD-10-CM | POA: Diagnosis present

## 2023-09-16 DIAGNOSIS — R2689 Other abnormalities of gait and mobility: Secondary | ICD-10-CM | POA: Diagnosis not present

## 2023-09-16 DIAGNOSIS — D631 Anemia in chronic kidney disease: Secondary | ICD-10-CM | POA: Diagnosis not present

## 2023-09-16 DIAGNOSIS — Z20822 Contact with and (suspected) exposure to covid-19: Secondary | ICD-10-CM | POA: Insufficient documentation

## 2023-09-16 DIAGNOSIS — L8990 Pressure ulcer of unspecified site, unspecified stage: Secondary | ICD-10-CM | POA: Insufficient documentation

## 2023-09-16 DIAGNOSIS — I129 Hypertensive chronic kidney disease with stage 1 through stage 4 chronic kidney disease, or unspecified chronic kidney disease: Secondary | ICD-10-CM | POA: Insufficient documentation

## 2023-09-16 DIAGNOSIS — Z7982 Long term (current) use of aspirin: Secondary | ICD-10-CM | POA: Diagnosis not present

## 2023-09-16 DIAGNOSIS — R262 Difficulty in walking, not elsewhere classified: Secondary | ICD-10-CM | POA: Diagnosis not present

## 2023-09-16 DIAGNOSIS — D469 Myelodysplastic syndrome, unspecified: Secondary | ICD-10-CM | POA: Insufficient documentation

## 2023-09-16 DIAGNOSIS — R6510 Systemic inflammatory response syndrome (SIRS) of non-infectious origin without acute organ dysfunction: Secondary | ICD-10-CM | POA: Insufficient documentation

## 2023-09-16 DIAGNOSIS — Z79899 Other long term (current) drug therapy: Secondary | ICD-10-CM | POA: Diagnosis not present

## 2023-09-16 DIAGNOSIS — I10 Essential (primary) hypertension: Secondary | ICD-10-CM | POA: Diagnosis not present

## 2023-09-16 DIAGNOSIS — R5383 Other fatigue: Secondary | ICD-10-CM | POA: Diagnosis not present

## 2023-09-16 DIAGNOSIS — F0280 Dementia in other diseases classified elsewhere without behavioral disturbance: Secondary | ICD-10-CM | POA: Diagnosis not present

## 2023-09-16 DIAGNOSIS — R531 Weakness: Secondary | ICD-10-CM | POA: Diagnosis not present

## 2023-09-16 DIAGNOSIS — J101 Influenza due to other identified influenza virus with other respiratory manifestations: Principal | ICD-10-CM | POA: Insufficient documentation

## 2023-09-16 DIAGNOSIS — Z66 Do not resuscitate: Secondary | ICD-10-CM | POA: Insufficient documentation

## 2023-09-16 DIAGNOSIS — Z87891 Personal history of nicotine dependence: Secondary | ICD-10-CM | POA: Insufficient documentation

## 2023-09-16 DIAGNOSIS — R2681 Unsteadiness on feet: Secondary | ICD-10-CM | POA: Diagnosis not present

## 2023-09-16 DIAGNOSIS — N1831 Chronic kidney disease, stage 3a: Secondary | ICD-10-CM | POA: Insufficient documentation

## 2023-09-16 DIAGNOSIS — E113491 Type 2 diabetes mellitus with severe nonproliferative diabetic retinopathy without macular edema, right eye: Secondary | ICD-10-CM | POA: Diagnosis not present

## 2023-09-16 DIAGNOSIS — E1122 Type 2 diabetes mellitus with diabetic chronic kidney disease: Secondary | ICD-10-CM | POA: Diagnosis not present

## 2023-09-16 LAB — CBC WITH DIFFERENTIAL/PLATELET
Abs Immature Granulocytes: 0.01 10*3/uL (ref 0.00–0.07)
Basophils Absolute: 0 10*3/uL (ref 0.0–0.1)
Basophils Relative: 1 %
Eosinophils Absolute: 0.1 10*3/uL (ref 0.0–0.5)
Eosinophils Relative: 3 %
HCT: 29.6 % — ABNORMAL LOW (ref 39.0–52.0)
Hemoglobin: 9.8 g/dL — ABNORMAL LOW (ref 13.0–17.0)
Immature Granulocytes: 0 %
Lymphocytes Relative: 21 %
Lymphs Abs: 0.5 10*3/uL — ABNORMAL LOW (ref 0.7–4.0)
MCH: 34.9 pg — ABNORMAL HIGH (ref 26.0–34.0)
MCHC: 33.1 g/dL (ref 30.0–36.0)
MCV: 105.3 fL — ABNORMAL HIGH (ref 80.0–100.0)
Monocytes Absolute: 0.3 10*3/uL (ref 0.1–1.0)
Monocytes Relative: 11 %
Neutro Abs: 1.5 10*3/uL — ABNORMAL LOW (ref 1.7–7.7)
Neutrophils Relative %: 64 %
Platelets: 79 10*3/uL — ABNORMAL LOW (ref 150–400)
RBC: 2.81 MIL/uL — ABNORMAL LOW (ref 4.22–5.81)
RDW: 16.3 % — ABNORMAL HIGH (ref 11.5–15.5)
WBC: 2.4 10*3/uL — ABNORMAL LOW (ref 4.0–10.5)
nRBC: 0.8 % — ABNORMAL HIGH (ref 0.0–0.2)

## 2023-09-16 LAB — LACTIC ACID, PLASMA
Lactic Acid, Venous: 2.1 mmol/L (ref 0.5–1.9)
Lactic Acid, Venous: 2.2 mmol/L (ref 0.5–1.9)

## 2023-09-16 LAB — URINALYSIS, W/ REFLEX TO CULTURE (INFECTION SUSPECTED)
Bacteria, UA: NONE SEEN
Bilirubin Urine: NEGATIVE
Glucose, UA: NEGATIVE mg/dL
Ketones, ur: NEGATIVE mg/dL
Nitrite: NEGATIVE
Protein, ur: >=300 mg/dL — AB
Specific Gravity, Urine: 1.012 (ref 1.005–1.030)
pH: 5 (ref 5.0–8.0)

## 2023-09-16 LAB — RESP PANEL BY RT-PCR (RSV, FLU A&B, COVID)  RVPGX2
Influenza A by PCR: POSITIVE — AB
Influenza B by PCR: NEGATIVE
Resp Syncytial Virus by PCR: NEGATIVE
SARS Coronavirus 2 by RT PCR: NEGATIVE

## 2023-09-16 LAB — COMPREHENSIVE METABOLIC PANEL
ALT: 12 U/L (ref 0–44)
AST: 26 U/L (ref 15–41)
Albumin: 4.2 g/dL (ref 3.5–5.0)
Alkaline Phosphatase: 71 U/L (ref 38–126)
Anion gap: 14 (ref 5–15)
BUN: 19 mg/dL (ref 8–23)
CO2: 23 mmol/L (ref 22–32)
Calcium: 8.9 mg/dL (ref 8.9–10.3)
Chloride: 99 mmol/L (ref 98–111)
Creatinine, Ser: 1.54 mg/dL — ABNORMAL HIGH (ref 0.61–1.24)
GFR, Estimated: 44 mL/min — ABNORMAL LOW (ref >60)
Glucose, Bld: 183 mg/dL — ABNORMAL HIGH (ref 70–99)
Potassium: 3.9 mmol/L (ref 3.5–5.1)
Sodium: 136 mmol/L (ref 135–145)
Total Bilirubin: 0.9 mg/dL (ref 0.0–1.2)
Total Protein: 8 g/dL (ref 6.5–8.1)

## 2023-09-16 LAB — CBG MONITORING, ED: Glucose-Capillary: 227 mg/dL — ABNORMAL HIGH (ref 70–99)

## 2023-09-16 LAB — SARS CORONAVIRUS 2 BY RT PCR: SARS Coronavirus 2 by RT PCR: NEGATIVE

## 2023-09-16 LAB — TROPONIN I (HIGH SENSITIVITY)
Troponin I (High Sensitivity): 12 ng/L (ref <18)
Troponin I (High Sensitivity): 15 ng/L (ref <18)

## 2023-09-16 NOTE — ED Triage Notes (Signed)
Pt from the [facility] via EMS with reports that pt was doing PT and began feeling weak all over, temp was assessed and was 103 at the facility, pt was given 600mg  tylenol PTA.   99.6 100HR 93RA 97/73

## 2023-09-16 NOTE — ED Triage Notes (Signed)
See first nurse note. Pt had sudden feeling of generalized weakness this afternoon while doing PT. Temp was 103, 600mg  tylenol given PTA> Temp now 100.3 oral. Pt has had intermittent cough since 2 weeks.   Skin is dry. Bilateral LE are swollen with +2 pitting edema.  1 set cultures sent.

## 2023-09-17 ENCOUNTER — Observation Stay
Admission: EM | Admit: 2023-09-17 | Discharge: 2023-09-18 | Disposition: A | Discharge status: Skilled Nursing Facility | Payer: PPO | Attending: Internal Medicine | Admitting: Internal Medicine

## 2023-09-17 ENCOUNTER — Inpatient Hospital Stay: Payer: PPO

## 2023-09-17 DIAGNOSIS — F0280 Dementia in other diseases classified elsewhere without behavioral disturbance: Secondary | ICD-10-CM | POA: Diagnosis present

## 2023-09-17 DIAGNOSIS — R531 Weakness: Principal | ICD-10-CM

## 2023-09-17 DIAGNOSIS — F0390 Unspecified dementia without behavioral disturbance: Secondary | ICD-10-CM | POA: Diagnosis not present

## 2023-09-17 DIAGNOSIS — J101 Influenza due to other identified influenza virus with other respiratory manifestations: Secondary | ICD-10-CM | POA: Diagnosis not present

## 2023-09-17 DIAGNOSIS — L8990 Pressure ulcer of unspecified site, unspecified stage: Secondary | ICD-10-CM | POA: Insufficient documentation

## 2023-09-17 DIAGNOSIS — E1122 Type 2 diabetes mellitus with diabetic chronic kidney disease: Secondary | ICD-10-CM

## 2023-09-17 DIAGNOSIS — J111 Influenza due to unidentified influenza virus with other respiratory manifestations: Secondary | ICD-10-CM

## 2023-09-17 DIAGNOSIS — D46Z Other myelodysplastic syndromes: Secondary | ICD-10-CM | POA: Diagnosis present

## 2023-09-17 DIAGNOSIS — N183 Chronic kidney disease, stage 3 unspecified: Secondary | ICD-10-CM | POA: Diagnosis present

## 2023-09-17 DIAGNOSIS — I10 Essential (primary) hypertension: Secondary | ICD-10-CM

## 2023-09-17 DIAGNOSIS — E1169 Type 2 diabetes mellitus with other specified complication: Secondary | ICD-10-CM

## 2023-09-17 DIAGNOSIS — R6510 Systemic inflammatory response syndrome (SIRS) of non-infectious origin without acute organ dysfunction: Secondary | ICD-10-CM | POA: Diagnosis present

## 2023-09-17 LAB — CBG MONITORING, ED
Glucose-Capillary: 185 mg/dL — ABNORMAL HIGH (ref 70–99)
Glucose-Capillary: 145 mg/dL — ABNORMAL HIGH (ref 70–99)

## 2023-09-17 LAB — GLUCOSE, CAPILLARY: Glucose-Capillary: 138 mg/dL — ABNORMAL HIGH (ref 70–99)

## 2023-09-17 MED ORDER — LOSARTAN POTASSIUM 25 MG PO TABS
25.0000 mg | ORAL_TABLET | Freq: Every day | ORAL | Status: DC
  Administered 2023-09-18: 25 mg via ORAL
  Filled 2023-09-17: qty 1

## 2023-09-17 MED ORDER — DONEPEZIL HCL 5 MG PO TABS
5.0000 mg | ORAL_TABLET | Freq: Every day | ORAL | Status: DC
  Administered 2023-09-18: 5 mg via ORAL
  Filled 2023-09-17: qty 1

## 2023-09-17 MED ORDER — SERTRALINE HCL 50 MG PO TABS
25.0000 mg | ORAL_TABLET | Freq: Every day | ORAL | Status: DC
  Administered 2023-09-18: 25 mg via ORAL
  Filled 2023-09-17: qty 1

## 2023-09-17 MED ORDER — ALBUTEROL SULFATE (2.5 MG/3ML) 0.083% IN NEBU
2.5000 mg | INHALATION_SOLUTION | RESPIRATORY_TRACT | Status: DC | PRN
Start: 2023-09-17 — End: 2023-09-18
  Administered 2023-09-18: 2.5 mg via RESPIRATORY_TRACT
  Filled 2023-09-17: qty 3

## 2023-09-17 MED ORDER — INSULIN ASPART 100 UNIT/ML IJ SOLN
3.0000 [IU] | Freq: Three times a day (TID) | INTRAMUSCULAR | Status: DC
  Administered 2023-09-18 (×2): 3 [IU] via SUBCUTANEOUS
  Filled 2023-09-17 (×3): qty 1

## 2023-09-17 MED ORDER — ENOXAPARIN SODIUM 40 MG/0.4ML IJ SOSY
40.0000 mg | PREFILLED_SYRINGE | INTRAMUSCULAR | Status: DC
  Administered 2023-09-17 – 2023-09-18 (×2): 40 mg via SUBCUTANEOUS
  Filled 2023-09-17 (×2): qty 0.4

## 2023-09-17 MED ORDER — SODIUM CHLORIDE 0.9 % IV SOLN
INTRAVENOUS | Status: AC
Start: 2023-09-17 — End: 2023-09-18

## 2023-09-17 MED ORDER — ONDANSETRON HCL 4 MG/2ML IJ SOLN: 4.0000 mg | Freq: Four times a day (QID) | INTRAMUSCULAR | Status: DC | PRN

## 2023-09-17 MED ORDER — SODIUM CHLORIDE 0.9 % IV BOLUS
1000.0000 mL | Freq: Once | INTRAVENOUS | Status: AC
  Administered 2023-09-17: 1000 mL via INTRAVENOUS

## 2023-09-17 MED ORDER — PRAVASTATIN SODIUM 20 MG PO TABS
20.0000 mg | ORAL_TABLET | Freq: Every day | ORAL | Status: DC
  Administered 2023-09-18: 20 mg via ORAL
  Filled 2023-09-17: qty 1

## 2023-09-17 MED ORDER — INSULIN ASPART 100 UNIT/ML IJ SOLN
0.0000 [IU] | Freq: Three times a day (TID) | INTRAMUSCULAR | Status: DC
  Administered 2023-09-17: 2 [IU] via SUBCUTANEOUS
  Administered 2023-09-17: 1 [IU] via SUBCUTANEOUS
  Administered 2023-09-18: 2 [IU] via SUBCUTANEOUS
  Administered 2023-09-18: 1 [IU] via SUBCUTANEOUS
  Filled 2023-09-17 (×4): qty 1

## 2023-09-17 MED ORDER — ASPIRIN 81 MG PO TBEC
81.0000 mg | DELAYED_RELEASE_TABLET | Freq: Every day | ORAL | Status: DC
  Administered 2023-09-18: 81 mg via ORAL
  Filled 2023-09-17: qty 1

## 2023-09-17 MED ORDER — AMLODIPINE BESYLATE 10 MG PO TABS
10.0000 mg | ORAL_TABLET | Freq: Every day | ORAL | Status: DC
  Administered 2023-09-18: 10 mg via ORAL
  Filled 2023-09-17: qty 1

## 2023-09-17 MED ORDER — ONDANSETRON HCL 4 MG PO TABS: 4.0000 mg | ORAL_TABLET | Freq: Four times a day (QID) | ORAL | Status: DC | PRN

## 2023-09-17 MED ORDER — ACETAMINOPHEN 500 MG PO TABS
1000.0000 mg | ORAL_TABLET | Freq: Once | ORAL | Status: AC
  Administered 2023-09-17: 1000 mg via ORAL
  Filled 2023-09-17: qty 2

## 2023-09-17 MED ORDER — ACETAMINOPHEN 325 MG PO TABS
650.0000 mg | ORAL_TABLET | Freq: Four times a day (QID) | ORAL | Status: DC | PRN
  Administered 2023-09-18: 650 mg via ORAL
  Filled 2023-09-17: qty 2

## 2023-09-17 MED ORDER — INSULIN GLARGINE-YFGN 100 UNIT/ML ~~LOC~~ SOLN
8.0000 [IU] | Freq: Every day | SUBCUTANEOUS | Status: DC
  Administered 2023-09-18: 8 [IU] via SUBCUTANEOUS
  Filled 2023-09-17: qty 0.08

## 2023-09-17 MED ORDER — PANTOPRAZOLE SODIUM 40 MG PO TBEC
40.0000 mg | DELAYED_RELEASE_TABLET | Freq: Two times a day (BID) | ORAL | Status: DC
  Administered 2023-09-17 – 2023-09-18 (×2): 40 mg via ORAL
  Filled 2023-09-17 (×2): qty 1

## 2023-09-17 MED ORDER — OSELTAMIVIR PHOSPHATE 30 MG PO CAPS
30.0000 mg | ORAL_CAPSULE | Freq: Two times a day (BID) | ORAL | Status: DC
  Administered 2023-09-17 – 2023-09-18 (×3): 30 mg via ORAL
  Filled 2023-09-17 (×4): qty 1

## 2023-09-17 NOTE — ED Provider Notes (Signed)
[facility] Provider Note    Event Date/Time   First MD Initiated Contact with Patient 09/17/23 0113     (approximate)   History   Fever and Cough   HPI  Alex Smith is a 89 y.o. male who presents to the emergency department today because of concerns for weakness and fevers.  Symptoms started today.  History is primarily obtained by daughter at bedside.  She states that the patient had been in his normal state of health recently except for a slight cough.  However today when he was working with physical therapy became very weak.  They did notice some to have a high fever at his living facility.  Apparently there is concerned about a flu outbreak there.     Physical Exam   Triage Vital Signs: ED Triage Vitals  Encounter Vitals Group     BP 09/16/23 1715 (!) 160/72     Systolic BP Percentile --      Diastolic BP Percentile --      Pulse Rate 09/16/23 1715 (!) 103     Resp 09/16/23 1715 20     Temp 09/16/23 1715 100.3 F (37.9 C)     Temp Source 09/16/23 1715 Oral     SpO2 09/16/23 1715 96 %     Weight 09/16/23 1715 195 lb 1.7 oz (88.5 kg)     Height 09/16/23 1715 5\' 7"  (1.702 m)     Head Circumference --      Peak Flow --      Pain Score 09/16/23 1717 0     Pain Loc --      Pain Education --      Exclude from Growth Chart --     Most recent vital signs: Vitals:   09/16/23 2133 09/17/23 0129  BP: (!) 171/79 (!) 163/75  Pulse: 90 95  Resp: 18 16  Temp: 98.9 F (37.2 C) (!) 100.5 F (38.1 C)  SpO2: 96% 95%   General: Awake, alert. CV:  Good peripheral perfusion. Regular rate and rhythm. Resp:  Normal effort. Lungs clear. Abd:  No distention.    ED Results / Procedures / Treatments   Labs (all labs ordered are listed, but only abnormal results are displayed) Labs Reviewed  RESP PANEL BY RT-PCR (RSV, FLU A&B, COVID)  RVPGX2 - Abnormal; Notable for the following components:      Result Value   Influenza A by PCR POSITIVE (*)     All other components within normal limits  LACTIC ACID, PLASMA - Abnormal; Notable for the following components:   Lactic Acid, Venous 2.1 (*)    All other components within normal limits  LACTIC ACID, PLASMA - Abnormal; Notable for the following components:   Lactic Acid, Venous 2.2 (*)    All other components within normal limits  COMPREHENSIVE METABOLIC PANEL - Abnormal; Notable for the following components:   Glucose, Bld 183 (*)    Creatinine, Ser 1.54 (*)    GFR, Estimated 44 (*)    All other components within normal limits  CBC WITH DIFFERENTIAL/PLATELET - Abnormal; Notable for the following components:   WBC 2.4 (*)    RBC 2.81 (*)    Hemoglobin 9.8 (*)    HCT 29.6 (*)    MCV 105.3 (*)    MCH 34.9 (*)    RDW 16.3 (*)    Platelets 79 (*)    nRBC 0.8 (*)    Neutro Abs 1.5 (*)  Lymphs Abs 0.5 (*)    All other components within normal limits  URINALYSIS, W/ REFLEX TO CULTURE (INFECTION SUSPECTED) - Abnormal; Notable for the following components:   Color, Urine YELLOW (*)    APPearance CLEAR (*)    Hgb urine dipstick SMALL (*)    Protein, ur >=300 (*)    Leukocytes,Ua TRACE (*)    All other components within normal limits  CBG MONITORING, ED - Abnormal; Notable for the following components:   Glucose-Capillary 227 (*)    All other components within normal limits  SARS CORONAVIRUS 2 BY RT PCR  TROPONIN I (HIGH SENSITIVITY)  TROPONIN I (HIGH SENSITIVITY)     EKG  None    RADIOLOGY I independently interpreted and visualized the CXR. My interpretation: No pneumonia Radiology interpretation:  IMPRESSION:  Stable bilateral pulmonary scarring and atelectasis with some  potential mild increased atelectasis at the right lung base.     PROCEDURES:  Critical Care performed: No    MEDICATIONS ORDERED IN ED: Medications - No data to display   IMPRESSION / MDM / ASSESSMENT AND PLAN / ED COURSE  I reviewed the triage vital signs and the nursing notes.                               Differential diagnosis includes, but is not limited to, viral URI, pneumonia, anemia, electrolyte abnormality  Patient's presentation is most consistent with acute presentation with potential threat to life or bodily function.   The patient is on the cardiac monitor to evaluate for evidence of arrhythmia and/or significant heart rate changes.  Patient presented to the emergency department today because of concerns for lethargy, fever.  On exam patient has occasional cough.  Patient did develop a slight fever here in the emergency department.  Workup was positive for influenza.  Patient also had a slight lactic acidosis.  However no pneumonia on chest x-ray.  I do wonder if this could be coming from slight dehydration.  Will give IV fluids and reassess.  Patient stated he did feel somewhat better after IV fluids however was still significantly weak.  Unable to sit himself up in his bed.  Given concern for profound weakness discussed with Dr. Arville Care with the hospitalist service who will evaluate for admission.     FINAL CLINICAL IMPRESSION(S) / ED DIAGNOSES   Final diagnoses:  Weakness  Influenza      Note:  This document was prepared using Dragon voice recognition software and may include unintentional dictation errors.    Phineas Semen, MD 09/17/23 [phone]

## 2023-09-17 NOTE — Assessment & Plan Note (Signed)
Cont aricept

## 2023-09-17 NOTE — Progress Notes (Signed)
Patient cleaned, linen changed, repositioned in bed. Patient needs assistance with eating at this time.

## 2023-09-17 NOTE — Assessment & Plan Note (Signed)
Cell lines appear stable.  Monitor

## 2023-09-17 NOTE — Assessment & Plan Note (Signed)
Blood sugar 180s SSI  A1C

## 2023-09-17 NOTE — ED Notes (Signed)
Moved patient up in bed and gave warm blankets.

## 2023-09-17 NOTE — Assessment & Plan Note (Signed)
Cr 1.5 w/ GFR in 40s  Near baseline  Monitor

## 2023-09-17 NOTE — H&P (Addendum)
History and Physical    Patient: Alex Smith VOH:[id number] DOB: 11/14/1934 DOA: 09/17/2023 DOS: the patient was seen and examined on 09/17/2023 PCP: Alex Smith  Patient coming from: Home  Chief Complaint:  Chief Complaint  Patient presents with   Fever   Cough   HPI: Alex Smith is a 89 y.o. male with medical history significant of myelodysplasia, Alzheimer's dementia, hypertension, BPH, CKD stage IIIa, insulin-dependent diabetes presenting with SIRS, influenza A, weakness.  Limited history in the setting of baseline dementia.  Per report, patient with increased cough, generalized malaise at nursing facility.  Patient noted to be feeling weak all over with temperature 103 at facility.  Mild cough.  No reported shortness of breath, abdominal pain, nausea or vomiting.  No diarrhea.  Symptoms of fairly sudden onset.  Noted admission around Christmas of last year for issues including sepsis and aspiration pneumonia.  Symptoms progressively improved after discharge. Presented to the ER Tmax 100.5, heart rate 80s to 103, BP grossly stable.  Satting well on room air.  White count 2.4, hemoglobin 9.8, platelets 79, lactate 2.2, troponin within normal limits, creatinine 1.54, glucose 183.  Urinalysis grossly stable.  Influenza A positive.  Chest x-ray with stable pulmonary scarring?  Mild atelectasis of the right lung base. Review of Systems: As mentioned in the history of present illness. All other systems reviewed and are negative. Past Medical History:  Diagnosis Date   Actinic keratosis    Alzheimer's dementia (HCC) 08/18/2021   Anemia    B12 deficiency    Basal cell carcinoma 03/30/2018   left ant nasal ala   Basal cell carcinoma 06/25/2023   R cheek, schedule for [facility]   Basal cell carcinoma of face 01/19/2013   right distal dorsum nose   BPH (benign prostatic hypertrophy)    followed by urology, discharged (Dr. 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 pretibial. 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 EDC 09/07/2020   Squamous cell carcinoma of skin 04/03/2020   Left cheek. WD SCC   Systolic murmur 05/28/2016   Type 2 diabetes, controlled, with retinopathy (HCC)    Past Surgical History:  Procedure Laterality Date   25 GAUGE PARS PLANA VITRECTOMY WITH 20 GAUGE MVR PORT FOR MACULAR HOLE  07/07/2012   Procedure: 25 GAUGE PARS PLANA VITRECTOMY WITH 20 GAUGE MVR PORT FOR MACULAR HOLE;  Surgeon: Sherrie George, MD;  Location: [facility] OR;  Service: Ophthalmology;  Laterality: Left;   BONE MARROW BIOPSY  2016   CARDIOVASCULAR STRESS TEST  2013   treadmill - no evidence ischemia, EF 61%   CATARACT EXTRACTION W/ INTRAOCULAR LENS  IMPLANT, BILATERAL  ~ 2010   COLONOSCOPY  2014   Elliot WNL no rpt needed, h/o polyps   EYE SURGERY Left 06/2012   laser surgery   GAS INSERTION  07/07/2012   Procedure: INSERTION OF GAS;  Surgeon: Sherrie George, MD;  Location: [facility] OR;  Service: Ophthalmology;  Laterality: Left;  C3F8   PERIPHERAL VASCULAR CATHETERIZATION N/A 02/23/2015   Procedure: Shelda Pal Cath Insertion;  Surgeon: Annice Needy, MD;  Location: [facility] INVASIVE CV LAB;  Service: Cardiovascular;  Laterality: N/A;   PORTA CATH REMOVAL N/A 02/16/2020   Procedure: PORTA CATH REMOVAL;  Surgeon: Annice Needy, MD;  Location: [facility] INVASIVE CV LAB;  Service: Cardiovascular;  Laterality: N/A;   SERUM PATCH  07/07/2012    Procedure: SERUM PATCH;  Surgeon: Sherrie George, MD;  Location[facility] OR;  Service: Ophthalmology;  Laterality: Left;   SKIN CANCER EXCISION     "all over my face" (07/07/2012)   Social History:  reports that he quit smoking about 54 years ago. His smoking use included cigarettes. He started smoking about 79 years ago. He has a 50 pack-year smoking history. He has never used smokeless tobacco. He reports that he does not currently use alcohol. He reports that he does not use drugs.  Allergies  Allergen Reactions   Rituximab Rash    Chest tightness Chest tightness   Blood-Group Specific Substance Other (See Comments)    Had a post transfusion reaction of red blood cells; NOW REQUIRES WASHED BLOOD CELLS Had a post transfusion reaction of red blood cells; NOW REQUIRES WASHED BLOOD CELLS   Primaxin [Imipenem] Other (See Comments)    Possible allergy Tolerates cephalosporins   Voriconazole Other (See Comments)   Sulfa Antibiotics Itching and Rash   Sulfacetamide Sodium Itching and Rash    Family History  Problem Relation Age of Onset   Dementia Mother    Heart failure Father 12   Cancer Sister        breast   Diabetes Paternal Uncle    Diabetes Paternal Aunt    CAD Brother 60       MI   Stroke Neg Hx     Prior to Admission medications   Medication Sig Start Date End Date Taking? Authorizing Provider  acetaminophen (TYLENOL) 500 MG tablet Take 2 tablets (1,000 mg total) by mouth 2 (two) times daily. 11/27/22   Alford Highland, MD  albuterol (VENTOLIN HFA) 108 (90 Base) MCG/ACT inhaler Inhale 2 puffs into the lungs every 6 (six) hours as needed for wheezing or shortness of breath. 04/20/22   Lynn Ito, MD  alum & mag hydroxide-simeth (MAALOX/MYLANTA) 200-200-20 MG/5ML suspension Take 30 mLs by mouth every 4 (four) hours as needed for indigestion or heartburn. 04/20/22   Lynn Ito, MD  amLODipine (NORVASC) 10 MG tablet Take 1 tablet (10 mg total) by mouth daily. 04/20/22   Lynn Ito, MD  aspirin EC 81 MG tablet Take 81 mg by mouth daily.    [provider]  donepezil (ARICEPT ODT) 5 MG disintegrating tablet Take 5 mg by mouth daily.    [provider]  famotidine (PEPCID) 20 MG tablet Take 20 mg by mouth daily.    [provider]  insulin glargine (LANTUS) 100 UNIT/ML Solostar Pen Inject 6 Units into the skin at bedtime. Patient taking differently: Inject 14 Units into the skin at bedtime. 11/27/22   Wieting, Richard, MD  insulin lispro (HUMALOG) 100 UNIT/ML KwikPen Inject 2 Units into the skin 3 (three) times daily before meals. Per sliding scale Patient not taking: Reported on 08/13/2023 11/27/22   Alford Highland, MD  insulin lispro (HUMALOG) 100 UNIT/ML KwikPen Inject into the skin. SLIDING  SCALE 11/27/22   [provider]  loperamide (IMODIUM A-D) 2 MG tablet Take 2 mg by mouth 4 (four) times daily as needed for diarrhea or loose stools.    [provider]  losartan (COZAAR) 25 MG tablet Take 1 tablet (25 mg total) by mouth daily. 11/27/22 11/27/23  Alford Highland, MD  magnesium hydroxide (MILK OF MAGNESIA) 400 MG/5ML suspension Take 5 mLs by mouth daily as needed for mild constipation.    [provider]  melatonin 5 MG TABS Take 10 mg by mouth at bedtime.    [provider]  Multiple Vitamin (MULTIVITAMIN WITH MINERALS) TABS tablet Take 1 tablet by mouth daily. 08/23/21   Lanae Boast, MD  pantoprazole (PROTONIX) 40 MG tablet Take 1 tablet (40 mg total) by mouth 2 (two) times daily. 04/20/22   Lynn Ito, MD  pravastatin (PRAVACHOL) 20 MG tablet Take 20 mg by mouth daily.    [provider]  sertraline (ZOLOFT) 25 MG tablet Take 25 mg by mouth daily.    [provider]  tamsulosin (FLOMAX) 0.4 MG CAPS capsule TAKE 1 CAPSULE BY MOUTH EVERY DAY 12/11/17   Eustaquio Boyden, MD  torsemide (DEMADEX) 20 MG tablet Take 20 mg by mouth daily.    [provider]  traZODone (DESYREL) 50 MG  tablet Take 25 mg by mouth at bedtime. 08/09/23   [provider]  vitamin B-12 (CYANOCOBALAMIN) 1000 MCG tablet Take 1 tablet (1,000 mcg total) by mouth daily. 06/01/19   Eustaquio Boyden, MD  vitamin E 400 UNIT capsule Take 400 Units by mouth daily.    [provider]    Physical Exam: Vitals:   09/17/23 0630 09/17/23 0700 09/17/23 0730 09/17/23 0800  BP: (!) 136/52 (!) 123/105 (!) 156/72 (!) 165/127  Pulse: 66 77 71 94  Resp: 18 14 16 18   Temp:    99.8 F (37.7 C)  TempSrc:    Oral  SpO2: 98% 97% 98% 100%  Weight:      Height:       Physical Exam Constitutional:      Appearance: He is normal weight.  HENT:     Head: Normocephalic and atraumatic.     Nose: Nose normal.     Mouth/Throat:     Mouth: Mucous membranes are moist.  Cardiovascular:     Rate and Rhythm: Normal rate and regular rhythm.  Pulmonary:     Effort: Pulmonary effort is normal.     Breath sounds: Wheezing present.  Abdominal:     General: Bowel sounds are normal.  Musculoskeletal:        General: Normal range of motion.  Skin:    General: Skin is dry.  Neurological:     General: No focal deficit present.  Psychiatric:        Mood and Affect: Mood normal.     Data Reviewed:  There are no new results to review at this time.  DG Chest 2 View CLINICAL DATA:  Weakness and fever.  EXAM: CHEST - 2 VIEW  COMPARISON:  08/13/2023  FINDINGS: The heart size and mediastinal contours are within normal limits. Stable bilateral pulmonary scarring and atelectasis with some potential mild increased atelectasis at the right lung base. There is no evidence of pulmonary edema, consolidation, pneumothorax, nodule or pleural fluid. The visualized skeletal structures are unremarkable.  IMPRESSION: Stable bilateral pulmonary scarring and atelectasis with some potential mild increased atelectasis at the right lung base.  Electronically Signed   By: Sherrine Maples  Fredia Sorrow M.D.   On: 09/16/2023  17:48  Lab Results  Component Value Date   WBC 2.4 (L) 09/16/2023   HGB 9.8 (L) 09/16/2023   HCT 29.6 (L) 09/16/2023   MCV 105.3 (H) 09/16/2023   PLT 79 (L) 09/16/2023   Last metabolic panel Lab Results  Component Value Date   GLUCOSE 183 (H) 09/16/2023   NA 136 09/16/2023   K 3.9 09/16/2023   CL 99 09/16/2023   CO2 23 09/16/2023   BUN 19 09/16/2023   CREATININE 1.54 (H) 09/16/2023   GFRNONAA 44 (L) 09/16/2023   CALCIUM 8.9 09/16/2023   PHOS 2.6 08/15/2023   PROT 8.0 09/16/2023   ALBUMIN 4.2 09/16/2023   LABGLOB 3.4 04/23/2023   AGRATIO 1.3 07/23/2018   BILITOT 0.9 09/16/2023   ALKPHOS 71 09/16/2023   AST 26 09/16/2023   ALT 12 09/16/2023   ANIONGAP 14 09/16/2023    Assessment and Plan: * Generalized weakness Influenza A  + generalized weakness and malaise in setting of influenza A No hypoxia  at present  CXR grossly stable- consider re-imaging as appropriate  IVF hydration  Start tamiflu  Monitor    SIRS (systemic inflammatory response syndrome) (HCC) Meeting SIRS criteria w/ Tmax 100.5, HR transiently in 100s  WBC 2.4  Lactate 2.1  CXR grossly stable at present  Influenza A +  Tamiflu  LR MIVF  Trend lactate  Monitor  Reassess if pt develops hypoxia or clinical condition deteriorates     Alzheimer's dementia (HCC) Cont aricept    CKD (chronic kidney disease) stage 3, GFR 30-59 ml/min (HCC) Cr 1.5 w/ GFR in 40s  Near baseline  Monitor    Myelodysplasia present in bone marrow (HCC) Cell lines appear stable.  Monitor    Type 2 diabetes mellitus with stage 3 chronic kidney disease (HCC) Blood sugar 180s SSI  A1C     Greater than 50% was spent in counseling and coordination of care with patient Total encounter time 80 minutes or more    Advance Care Planning:   Code Status: Limited: Do not attempt resuscitation (DNR) -DNR-LIMITED -Do Not Intubate/DNI    Consults: None   Family Communication: No family at the bedside   Severity of  Illness: The appropriate patient status for this patient is OBSERVATION. Observation status is judged to be reasonable and necessary in order to provide the required intensity of service to ensure the patient's safety. The patient's presenting symptoms, physical exam findings, and initial radiographic and laboratory data in the context of their medical condition is felt to place them at decreased risk for further clinical deterioration. Furthermore, it is anticipated that the patient will be medically stable for discharge from the hospital within 2 midnights of admission.   Author: Floydene Flock, MD 09/17/2023 8:10 AM  For on call review [email or web address].

## 2023-09-17 NOTE — ED Notes (Signed)
Pt brief changed. Pt denies any other needs at this time. CB remains within reach. Fall alert also working

## 2023-09-17 NOTE — Assessment & Plan Note (Addendum)
Meeting SIRS criteria w/ Tmax 100.5, HR transiently in 100s  WBC 2.4  Lactate 2.1  CXR grossly stable at present  Influenza A +  Tamiflu  LR MIVF  Trend lactate  Monitor  Reassess if pt develops hypoxia or clinical condition deteriorates

## 2023-09-17 NOTE — ED Notes (Signed)
Pt pulled up in bed. Verified bed alarm is working. CB remains within reach. No needs voiced at this time.

## 2023-09-17 NOTE — ED Notes (Signed)
Bed and brief changed. Pt tolerated well

## 2023-09-17 NOTE — ED Notes (Signed)
This tech and [employer] changed pt after urinary incontinence. Peri care was performed and new linen, chux, and brief were applied. Pt is now relaxing in bed at this time.

## 2023-09-17 NOTE — Assessment & Plan Note (Addendum)
Influenza A  + generalized weakness and malaise in setting of influenza A No hypoxia  at present  CXR grossly stable- consider re-imaging as appropriate  IVF hydration  Start tamiflu  Monitor

## 2023-09-18 DIAGNOSIS — R531 Weakness: Secondary | ICD-10-CM | POA: Diagnosis not present

## 2023-09-18 DIAGNOSIS — L8990 Pressure ulcer of unspecified site, unspecified stage: Secondary | ICD-10-CM | POA: Insufficient documentation

## 2023-09-18 DIAGNOSIS — J101 Influenza due to other identified influenza virus with other respiratory manifestations: Secondary | ICD-10-CM | POA: Diagnosis present

## 2023-09-18 LAB — GLUCOSE, CAPILLARY
Glucose-Capillary: 180 mg/dL — ABNORMAL HIGH (ref 70–99)
Glucose-Capillary: 149 mg/dL — ABNORMAL HIGH (ref 70–99)

## 2023-09-18 LAB — COMPREHENSIVE METABOLIC PANEL
ALT: 12 U/L (ref 0–44)
AST: 22 U/L (ref 15–41)
Albumin: 2.9 g/dL — ABNORMAL LOW (ref 3.5–5.0)
Alkaline Phosphatase: 45 U/L (ref 38–126)
Anion gap: 8 (ref 5–15)
BUN: 18 mg/dL (ref 8–23)
CO2: 22 mmol/L (ref 22–32)
Calcium: 8 mg/dL — ABNORMAL LOW (ref 8.9–10.3)
Chloride: 107 mmol/L (ref 98–111)
Creatinine, Ser: 1.3 mg/dL — ABNORMAL HIGH (ref 0.61–1.24)
GFR, Estimated: 54 mL/min — ABNORMAL LOW (ref >60)
Glucose, Bld: 137 mg/dL — ABNORMAL HIGH (ref 70–99)
Potassium: 3.6 mmol/L (ref 3.5–5.1)
Sodium: 137 mmol/L (ref 135–145)
Total Bilirubin: 0.8 mg/dL (ref 0.0–1.2)
Total Protein: 6.2 g/dL — ABNORMAL LOW (ref 6.5–8.1)

## 2023-09-18 LAB — LACTIC ACID, PLASMA: Lactic Acid, Venous: 1 mmol/L (ref 0.5–1.9)

## 2023-09-18 LAB — CBC
HCT: 24.2 % — ABNORMAL LOW (ref 39.0–52.0)
Hemoglobin: 7.9 g/dL — ABNORMAL LOW (ref 13.0–17.0)
MCH: 34.5 pg — ABNORMAL HIGH (ref 26.0–34.0)
MCHC: 32.6 g/dL (ref 30.0–36.0)
MCV: 105.7 fL — ABNORMAL HIGH (ref 80.0–100.0)
Platelets: 54 10*3/uL — ABNORMAL LOW (ref 150–400)
RBC: 2.29 MIL/uL — ABNORMAL LOW (ref 4.22–5.81)
RDW: 15.9 % — ABNORMAL HIGH (ref 11.5–15.5)
WBC: 2 10*3/uL — ABNORMAL LOW (ref 4.0–10.5)
nRBC: 0 % (ref 0.0–0.2)

## 2023-09-18 MED ORDER — INSULIN GLARGINE 100 UNIT/ML SOLOSTAR PEN: 14.0000 [IU] | PEN_INJECTOR | Freq: Every day | SUBCUTANEOUS | Status: DC

## 2023-09-18 MED ORDER — OSELTAMIVIR PHOSPHATE 30 MG PO CAPS: 30.0000 mg | ORAL_CAPSULE | Freq: Two times a day (BID) | ORAL | 0 refills | Status: DC

## 2023-09-18 MED ORDER — ORAL CARE MOUTH RINSE: 15.0000 mL | OROMUCOSAL | Status: DC | PRN

## 2023-09-18 MED ORDER — OSELTAMIVIR PHOSPHATE 30 MG PO CAPS: 30.0000 mg | ORAL_CAPSULE | Freq: Two times a day (BID) | ORAL | Status: AC

## 2023-09-18 NOTE — Hospital Course (Signed)
34GM with h/o myelodysplasia, Alzheimer's dementia, HTN, HLD, stage 3a CKD, and DM who presented on 1/29 with fever and cough and was found to have Influenza A.  He was started on Tamiflu.

## 2023-09-18 NOTE — Evaluation (Signed)
Physical Therapy Evaluation Patient Details Name: Alex Smith MRN: [id number] DOB: 1934-11-10 Today's Date: 09/18/2023  History of Present Illness  Herb Dearmas is an 67yoM [place] via EMS with reports that pt was doing PT and began feeling weak all over, temp was assessed and was 103 at the facility,Pt sent in due to increased cough, generalized malaise at nursing facility. Workup revealing of SIRS, influenza A, weakness. PMH: myelodysplasia, Alzheimer's dementia, HTN, BPH, CKD stage IIIa, insulin-dependent DM.Marland Kitchen  Clinical Impression  Pt in bed, DTR at bedside. Pt feeling mch better and moving better as well. Pt able to perform all basic mobility without physical assistance, however he requires elevated HOB and RW. Pt is able to maintain sats on room air throughout, reports continued acute weakness, but good tolerance to overground AMB. Pt is well poised for return to ALF where he can continue to work with PT there- he feel able to perform his typical mobility in a safe capacity at this time.       If plan is discharge home, recommend the following:     Can travel by private vehicle        Equipment Recommendations None recommended by PT  Recommendations for Other Services       Functional Status Assessment Patient has had a recent decline in their functional status and demonstrates the ability to make significant improvements in function in a reasonable and predictable amount of time.     Precautions / Restrictions Precautions Precautions: Fall Restrictions Weight Bearing Restrictions Per Provider Order: No      Mobility  Bed Mobility Overal bed mobility: Needs Assistance Bed Mobility: Supine to Sit     Supine to sit: Supervision, HOB elevated          Transfers   Equipment used: Rolling walker (2 wheels) Transfers: Sit to/from Stand Sit to Stand: Supervision                Ambulation/Gait Ambulation/Gait assistance: Contact guard  assist, Supervision Gait Distance (Feet): 200 Feet (stopped to check sats, was going to do another lap, but ended session so MD could meet with pt/DTR) Assistive device: Rolling walker (2 wheels) Gait Pattern/deviations: Step-through pattern Gait velocity: 0.80m/s     General Gait Details: reprts to feel good, on room air at 94%  Stairs            Wheelchair Mobility     Tilt Bed    Modified Rankin (Stroke Patients Only)       Balance Overall balance assessment: Modified Independent                                           Pertinent Vitals/Pain Pain Assessment Pain Assessment: No/denies pain    Home Living Family/patient expects to be discharged to:: Assisted living                 Home Equipment: Agricultural consultant (2 wheels) Additional Comments: [facility]    Prior Function Prior Level of Function : Needs assist       Physical Assist : ADLs (physical)     Mobility Comments: MOD I with RW use ADLs Comments: assist for bathing and IADLs     Extremity/Trunk Assessment                Communication  Cognition Arousal: Alert Behavior During Therapy: WFL for tasks assessed/performed Overall Cognitive Status: Within Functional Limits for tasks assessed                                          General Comments      Exercises     Assessment/Plan    PT Assessment Patient needs continued PT services  PT Problem List Decreased strength;Decreased range of motion;Decreased activity tolerance;Decreased balance;Decreased mobility;Decreased coordination       PT Treatment Interventions DME instruction;Gait training;Stair training;Functional mobility training;Therapeutic activities;Therapeutic exercise;Balance training;Neuromuscular re-education;Patient/family education    PT Goals (Current goals can be found in the Care Plan section)  Acute Rehab PT Goals Patient Stated Goal:  regain strength PT Goal Formulation: With patient Time For Goal Achievement: 10/02/23 Potential to Achieve Goals: Good    Frequency Min 1X/week     Co-evaluation               AM-PAC PT "6 Clicks" Mobility  Outcome Measure                  End of Session Equipment Utilized During Treatment: Gait belt;Oxygen Activity Tolerance: Patient tolerated treatment well;No increased pain Patient left: in chair;with call bell/phone within reach;with nursing/sitter in room;with family/visitor present Nurse Communication: Mobility status PT Visit Diagnosis: Unsteadiness on feet (R26.81);Difficulty in walking, not elsewhere classified (R26.2);Other abnormalities of gait and mobility (R26.89)    Time: 8119-1478 PT Time Calculation (min) (ACUTE ONLY): 24 min   Charges:   PT Evaluation $PT Eval Moderate Complexity: 1 Mod   PT General Charges $$ ACUTE PT VISIT: 1 Visit        5:05 PM, 09/18/23 Rosamaria Lints, PT, DPT Physical Therapist - [facility]  [phone] (ASCOM)    Gaje Tennyson C 09/18/2023, 5:03 PM

## 2023-09-18 NOTE — Evaluation (Signed)
Occupational Therapy Evaluation Patient Details Name: Alex Smith MRN: [id number] DOB: Jun 15, 1935 Today's Date: 09/18/2023   History of Present Illness 89 y.o. male presenting with SIRS, influenza A, weakness. Limited history in the setting of baseline dementia.  Per report, pt with increased cough, generalized malaise at nursing facility.  PMHx: myelodysplasia, Alzheimer's dementia, hypertension, BPH, CKD stage IIIa, insulin-dependent diabetes.   Clinical Impression   Pt was seen for OT evaluation this date. Prior to hospital admission, pt was living at [facility] at ALF where he was MOD I with RW use and has assist for bathing and IADLs.  Pt presents to acute OT demonstrating impaired ADL performance and functional mobility 2/2 weakness and low activity tolerance (See OT problem list for additional functional deficits). Pt currently requires Min A for bed mobility using bed rails for trunkal elevation and cueing. Able to forward scoot SUP and STS from EOB to RW with cueing to push up from RW with CGA/SBA. Ambulated to the bathroom and back with RW with SBA. Toilet transfer with CGA/SBA using grab bar. Stood at sink to perform oral care and hand washing with SUP/SBA. Sp02 on RA 93-94% throughout. No LOB throughout session, soiled gown therefore changed gowns. Pt would benefit from skilled OT services to address noted impairments and functional limitations (see below for any additional details) in order to maximize safety and independence while minimizing falls risk and caregiver burden. Do anticipate the need for follow up OT services upon acute hospital DC.        If plan is discharge home, recommend the following: A little help with walking and/or transfers;A little help with bathing/dressing/bathroom;Direct supervision/assist for financial management;Supervision due to cognitive status;Assist for transportation;Help with stairs or ramp for entrance;Direct supervision/assist for  medications management    Functional Status Assessment  Patient has had a recent decline in their functional status and demonstrates the ability to make significant improvements in function in a reasonable and predictable amount of time.  Equipment Recommendations  None recommended by OT    Recommendations for Other Services       Precautions / Restrictions Precautions Precautions: Fall Restrictions Weight Bearing Restrictions Per Provider Order: No      Mobility Bed Mobility Overal bed mobility: Needs Assistance Bed Mobility: Supine to Sit     Supine to sit: Min assist, HOB elevated, Used rails          Transfers Overall transfer level: Needs assistance Equipment used: Rolling walker (2 wheels) Transfers: Sit to/from Stand Sit to Stand: Supervision, Contact guard assist           General transfer comment: SBA for STS from EOB and CGA/SBA using RW for in room mobility      Balance Overall balance assessment: Needs assistance Sitting-balance support: Feet supported Sitting balance-Leahy Scale: Good       Standing balance-Leahy Scale: Good Standing balance comment: no LOB standing at sink without support to perform ADLs                           ADL either performed or assessed with clinical judgement   ADL Overall ADL's : Needs assistance/impaired     Grooming: Oral care;Wash/dry hands;Standing;Supervision/safety           Upper Body Dressing : Sitting;Supervision/safety Upper Body Dressing Details (indicate cue type and reason): to don gown seated on toilet     Toilet Transfer: Contact guard assist;Supervision/safety;Rolling walker (2 wheels);Grab bars  Vision         Perception         Praxis         Pertinent Vitals/Pain Pain Assessment Pain Assessment: Faces Faces Pain Scale: Hurts a little bit Pain Location: back Pain Descriptors / Indicators: Aching Pain Intervention(s): Monitored during  session, Repositioned     Extremity/Trunk Assessment Upper Extremity Assessment Upper Extremity Assessment: Overall WFL for tasks assessed   Lower Extremity Assessment Lower Extremity Assessment: Generalized weakness       Communication Communication Communication: No apparent difficulties   Cognition Arousal: Alert Behavior During Therapy: WFL for tasks assessed/performed Overall Cognitive Status: History of cognitive impairments - at baseline                                       General Comments  sp02 93-94% on RA throughout session    Exercises Other Exercises Other Exercises: Edu on role of OT in acute setting   Shoulder Instructions      Home Living Family/patient expects to be discharged to:: Assisted living                             Home Equipment: Agricultural consultant (2 wheels)   Additional Comments: [facility]      Prior Functioning/Environment Prior Level of Function : Needs assist       Physical Assist : ADLs (physical)     Mobility Comments: MOD I with RW use ADLs Comments: assist for bathing and IADLs        OT Problem List: Decreased strength;Decreased activity tolerance;Impaired balance (sitting and/or standing)      OT Treatment/Interventions: Self-care/ADL training;Therapeutic exercise;Patient/family education;Balance training;Energy conservation;Therapeutic activities    OT Goals(Current goals can be found in the care plan section) Acute Rehab OT Goals Patient Stated Goal: improve strength OT Goal Formulation: With patient Time For Goal Achievement: 10/02/23 Potential to Achieve Goals: Good ADL Goals Pt Will Perform Upper Body Dressing: with set-up;sitting Pt Will Perform Lower Body Dressing: with modified independence;with set-up;sit to/from stand;sitting/lateral leans Pt Will Transfer to Toilet: with modified independence;with set-up;ambulating;regular height toilet Pt Will  Perform Toileting - Clothing Manipulation and hygiene: with set-up;sitting/lateral leans;sit to/from stand Additional ADL Goal #1: Pt will verbalize/demo ECS/PLB/use of AE/AD for preventing overexertion and maximizing IND/safety/strength.  OT Frequency: Min 1X/week    Co-evaluation              AM-PAC OT "6 Clicks" Daily Activity     Outcome Measure Help from another person eating meals?: None Help from another person taking care of personal grooming?: None Help from another person toileting, which includes using toliet, bedpan, or urinal?: A Little Help from another person bathing (including washing, rinsing, drying)?: A Lot Help from another person to put on and taking off regular upper body clothing?: None Help from another person to put on and taking off regular lower body clothing?: A Little 6 Click Score: 20   End of Session Equipment Utilized During Treatment: Rolling walker (2 wheels) Nurse Communication: Mobility status  Activity Tolerance: Patient tolerated treatment well Patient left: in chair;with call bell/phone within reach;with chair alarm set  OT Visit Diagnosis: Other abnormalities of gait and mobility (R26.89);Muscle weakness (generalized) (M62.81)                Time: 4098-1191 OT Time  Calculation (min): 24 min Charges:  OT General Charges $OT Visit: 1 Visit OT Evaluation $OT Eval Moderate Complexity: 1 Mod OT Treatments $Self Care/Home Management : 8-22 mins Coy Vandoren, OTR/L 09/18/23, 1:01 PM  Constance Goltz 09/18/2023, 12:57 PM

## 2023-09-18 NOTE — Plan of Care (Signed)
  Problem: Education: Goal: Ability to describe self-care measures that may prevent or decrease complications (Diabetes Survival Skills Education) will improve Outcome: Not Progressing   Problem: Coping: Goal: Ability to adjust to condition or change in health will improve Outcome: Progressing   Problem: Fluid Volume: Goal: Ability to maintain a balanced intake and output will improve Outcome: Progressing   Problem: Health Behavior/Discharge Planning: Goal: Ability to identify and utilize available resources and services will improve Outcome: Not Progressing Goal: Ability to manage health-related needs will improve Outcome: Not Progressing   Problem: Metabolic: Goal: Ability to maintain appropriate glucose levels will improve Outcome: Progressing   Problem: Nutritional: Goal: Maintenance of adequate nutrition will improve Outcome: Progressing Goal: Progress toward achieving an optimal weight will improve Outcome: Progressing   Problem: Skin Integrity: Goal: Risk for impaired skin integrity will decrease Outcome: Progressing   Problem: Tissue Perfusion: Goal: Adequacy of tissue perfusion will improve Outcome: Progressing   Problem: Education: Goal: Knowledge of General Education information will improve Description: Including pain rating scale, medication(s)/side effects and non-pharmacologic comfort measures Outcome: Progressing   Problem: Health Behavior/Discharge Planning: Goal: Ability to manage health-related needs will improve Outcome: Progressing   Problem: Clinical Measurements: Goal: Ability to maintain clinical measurements within normal limits will improve Outcome: Progressing Goal: Will remain free from infection Outcome: Progressing Goal: Diagnostic test results will improve Outcome: Progressing Goal: Respiratory complications will improve Outcome: Progressing Goal: Cardiovascular complication will be avoided Outcome: Progressing   Problem:  Activity: Goal: Risk for activity intolerance will decrease Outcome: Not Progressing   Problem: Nutrition: Goal: Adequate nutrition will be maintained Outcome: Progressing   Problem: Coping: Goal: Level of anxiety will decrease Outcome: Progressing   Problem: Elimination: Goal: Will not experience complications related to bowel motility Outcome: Progressing Goal: Will not experience complications related to urinary retention Outcome: Progressing   Problem: Pain Managment: Goal: General experience of comfort will improve and/or be controlled Outcome: Progressing

## 2023-09-18 NOTE — Progress Notes (Signed)
Discharge instructions and prescription given to daughter and patient. Daughter verbalizes understanding. All personal belonging taken. Transported by staff to lobby. Patient will be leaving in daughter's car. No concerns voiced.

## 2023-09-18 NOTE — Progress Notes (Signed)
Subjective:  Patient ID: Alex Smith, male    DOB: 01/12/1935,  MRN: [id number]  89 y.o. male presents to clinic with  at risk foot care. Pt has h/o NIDDM with chronic kidney disease and painful, elongated thickened toenails x 10 which are symptomatic when wearing enclosed shoe gear. This interferes with his/her daily activities. He resides at [facility]. He is accompanied by his daughter on today's visit. He has developed rash on both legs Chief Complaint  Patient presents with   Nail Problem    Pt is her for [facility] unsure of last A1C PCP is Doctor on call at facility.   PCP is [facility]  Allergies  Allergen Reactions   Rituximab Rash    Chest tightness Chest tightness   Blood-Group Specific Substance Other (See Comments)    Had a post transfusion reaction of red blood cells; NOW REQUIRES WASHED BLOOD CELLS Had a post transfusion reaction of red blood cells; NOW REQUIRES WASHED BLOOD CELLS   Primaxin [Imipenem] Other (See Comments)    Possible allergy Tolerates cephalosporins   Voriconazole Other (See Comments)   Sulfa Antibiotics Itching and Rash   Sulfacetamide Sodium Itching and Rash    Review of Systems: Negative except as noted in the HPI.   Objective:  Alex Smith is a pleasant 89 y.o. male in NAD. AAO x 3.  Vascular Examination: Vascular status intact b/l with palpable pedal pulses. CFT immediate b/l. No edema. No pain with calf compression b/l. Skin temperature gradient WNL b/l. Pedal hair absent. No cyanosis or clubbing noted b/l LE.  Neurological Examination: Sensation grossly intact b/l with 10 gram monofilament. Vibratory sensation intact b/l.   Dermatological Examination: Toenails 1-5 right and 1, 3, 4, 5 thick, discolored, elongated with subungual debris and pain on dorsal palpation. No hyperkeratotic lesions noted b/l. Anonychia noted L 2nd toe. Nailbed(s) epithelialized.  Skin eruption noted anterior aspect both legs. Preulcerative  lesion noted L 2nd toe. There is visible subdermal hemorrhage. There is no surrounding erythema, no edema, no drainage, no odor, no fluctuance.  Musculoskeletal Examination: Muscle strength 5/5 to b/l LE. Clawtoe deformity L 2nd toe.  Radiographs: None  Last A1c:      Latest Ref Rng & Units 08/13/2023    5:46 PM 11/18/2022   11:18 PM  Hemoglobin A1C  Hemoglobin-A1c 4.8 - 5.6 % 7.2  6.5      Assessment:   1. Pain due to onychomycosis of toenails of both feet   2. Pre-ulcerative corn or callous   3. Type 2 diabetes mellitus with stage 3 chronic kidney disease, with long-term current use of insulin, unspecified whether stage 3a or 3b CKD (HCC)    Plan:  -Consent given for treatment as described below: -Examined patient. -PCP to evaluate rash b/l LE. -Patient to continue soft, supportive shoe gear daily. -Mycotic toenails 1-5 right foot, left great toe, L 3rd toe, L 4th toe, and L 5th toe were debrided in length and girth with sterile nail nippers and dremel without iatrogenic bleeding. -Preulcerative lesion pared L 2nd toe utilizing sterile scalpel blade. Total number pared=1. -Patient/POA to call should there be question/concern in the interim.  Return in about 10 weeks (around 11/20/2023).  Alex Smith, DPM      [place] LOCATION: [address]  [place], [place] [address]                   Office [phone]   [facility] LOCATION: [address] [place], [place] [address] Office [phone]

## 2023-09-18 NOTE — Plan of Care (Signed)
  Problem: Education: Goal: Ability to describe self-care measures that may prevent or decrease complications (Diabetes Survival Skills Education) will improve Outcome: Adequate for Discharge Goal: Individualized Educational Video(s) Outcome: Adequate for Discharge   Problem: Coping: Goal: Ability to adjust to condition or change in health will improve Outcome: Adequate for Discharge   Problem: Fluid Volume: Goal: Ability to maintain a balanced intake and output will improve Outcome: Adequate for Discharge   Problem: Health Behavior/Discharge Planning: Goal: Ability to identify and utilize available resources and services will improve Outcome: Adequate for Discharge Goal: Ability to manage health-related needs will improve Outcome: Adequate for Discharge   Problem: Metabolic: Goal: Ability to maintain appropriate glucose levels will improve Outcome: Adequate for Discharge   Problem: Nutritional: Goal: Maintenance of adequate nutrition will improve Outcome: Adequate for Discharge Goal: Progress toward achieving an optimal weight will improve Outcome: Adequate for Discharge   Problem: Skin Integrity: Goal: Risk for impaired skin integrity will decrease Outcome: Adequate for Discharge   Problem: Tissue Perfusion: Goal: Adequacy of tissue perfusion will improve Outcome: Adequate for Discharge   Problem: Education: Goal: Knowledge of General Education information will improve Description: Including pain rating scale, medication(s)/side effects and non-pharmacologic comfort measures Outcome: Adequate for Discharge   Problem: Health Behavior/Discharge Planning: Goal: Ability to manage health-related needs will improve Outcome: Adequate for Discharge   Problem: Clinical Measurements: Goal: Ability to maintain clinical measurements within normal limits will improve Outcome: Adequate for Discharge Goal: Will remain free from infection Outcome: Adequate for Discharge Goal:  Diagnostic test results will improve Outcome: Adequate for Discharge Goal: Respiratory complications will improve Outcome: Adequate for Discharge Goal: Cardiovascular complication will be avoided Outcome: Adequate for Discharge   Problem: Activity: Goal: Risk for activity intolerance will decrease Outcome: Adequate for Discharge   Problem: Nutrition: Goal: Adequate nutrition will be maintained Outcome: Adequate for Discharge   Problem: Coping: Goal: Level of anxiety will decrease Outcome: Adequate for Discharge   Problem: Elimination: Goal: Will not experience complications related to bowel motility Outcome: Adequate for Discharge Goal: Will not experience complications related to urinary retention Outcome: Adequate for Discharge   Problem: Pain Managment: Goal: General experience of comfort will improve and/or be controlled Outcome: Adequate for Discharge   Problem: Safety: Goal: Ability to remain free from injury will improve Outcome: Adequate for Discharge   Problem: Skin Integrity: Goal: Risk for impaired skin integrity will decrease Outcome: Adequate for Discharge   Problem: Acute Rehab OT Goals (only OT should resolve) Goal: Pt. Will Perform Upper Body Dressing Outcome: Adequate for Discharge Goal: Pt. Will Perform Lower Body Dressing Outcome: Adequate for Discharge Goal: Pt. Will Transfer To Toilet Outcome: Adequate for Discharge Goal: Pt. Will Perform Toileting-Clothing Manipulation Outcome: Adequate for Discharge Goal: OT Additional ADL Goal #1 Outcome: Adequate for Discharge

## 2023-09-18 NOTE — TOC Initial Note (Addendum)
Transition of Care [facility]) - Initial/Assessment Note    Patient Details  Name: Alex Smith MRN: [id number] Date of Birth: Sep 30, 1934  Transition of [facility]) CM/SW Contact:    Margarito Liner, LCSW Phone Number: 09/18/2023, 9:56 AM  Clinical Narrative:  Per chart review, patient admitted from [facility]. Administrator confirmed he is a resident on their ALF side. He uses a RW to ambulate at the facility. [facility] Home Health liaison is confirming they are still active with patient and if so, which services.    10:29 am: Patient is active with [facility] for PT.              Expected Discharge Plan: Assisted Living (with home health) Barriers to Discharge: Continued Medical Work up   Patient Goals and CMS Choice            Expected Discharge Plan and Services       Living arrangements for the past 2 months: Assisted Living Facility                                      Prior Living Arrangements/Services Living arrangements for the past 2 months: Assisted Living Facility Lives with:: Facility Resident Patient language and need for interpreter reviewed:: Yes        Need for Family Participation in Patient Care: Yes (Comment) Care giver support system in place?: Yes (comment) Current home services: DME Criminal Activity/Legal Involvement Pertinent to Current Situation/Hospitalization: No - Comment as needed  Activities of Daily Living   ADL Screening (condition at time of admission) Independently performs ADLs?: No Does the patient have a NEW difficulty with bathing/dressing/toileting/self-feeding that is expected to last >3 days?: No Does the patient have a NEW difficulty with getting in/out of bed, walking, or climbing stairs that is expected to last >3 days?: No Does the patient have a NEW difficulty with communication that is expected to last >3 days?: No Is the patient deaf or have difficulty hearing?: No Does the patient have difficulty seeing,  even when wearing glasses/contacts?: No Does the patient have difficulty concentrating, remembering, or making decisions?: Yes  Permission Sought/Granted                  Emotional Assessment       Orientation: : Oriented to Self, Oriented to Place Alcohol / Substance Use: Not Applicable Psych Involvement: No (comment)  Admission diagnosis:  Weakness [R53.1] Generalized weakness [R53.1] Influenza [J11.1] Patient Active Problem List   Diagnosis Date Noted   SIRS (systemic inflammatory response syndrome) (HCC) 09/17/2023   Aspiration pneumonia (HCC) 08/13/2023   Sepsis (HCC) 08/13/2023   Edema of lower extremity 12/25/2022   Chronic kidney disease due to hypertension 12/25/2022   Normocytic anemia 11/27/2022   Pancytopenia (HCC) 11/21/2022   Metabolic acidosis 11/21/2022   Overweight (BMI 25.0-29.9) 11/20/2022   Acute renal failure superimposed on stage 3a chronic kidney disease (HCC) 11/19/2022   Acute respiratory failure with hypoxia (HCC) 04/16/2022   Generalized weakness 04/16/2022   Chronic kidney disease, stage 3b (HCC) 04/16/2022   COVID-19 04/10/2022   Hypercholesterolemia 11/12/2021   Hyperglycemia due to type 2 diabetes mellitus (HCC) 11/12/2021   Closed compression fracture of L2 lumbar vertebra, initial encounter (HCC) 08/18/2021   Alzheimer's dementia (HCC) 08/18/2021   Acute lower UTI leading to weakness and dehydration 08/18/2021   Laceration of head 08/18/2021   Hypoglycemia 06/29/2021  Severe sepsis (HCC) 05/04/2021   CAP (community acquired pneumonia) 05/02/2021   Vitamin D deficiency 03/10/2019   Depressed mood 03/10/2019   CKD (chronic kidney disease) stage 3, GFR 30-59 ml/min (HCC) 11/14/2017   Goals of care, counseling/discussion 05/27/2017   Pancytopenia due to antineoplastic chemotherapy (HCC) 11/17/2016   Chemotherapy-induced neutropenia (HCC) 09/22/2016   Urinary incontinence 08/02/2016   Systolic murmur 05/28/2016   Myelodysplastic  syndrome, low grade (HCC) 02/05/2016   Multiple myeloma not having achieved remission (HCC) 02/05/2016   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   Myelodysplasia present in bone marrow (HCC) 11/03/2015   Advanced care planning/counseling discussion 05/22/2015   Insomnia 03/03/2015   Monoclonal gammopathy 02/03/2015   Macrocytic anemia 02/03/2015   Obesity (BMI 30-39.9) 10/12/2014   History of nonmelanoma skin cancer 10/10/2014   Medicare annual wellness visit, subsequent 04/12/2014   Health maintenance examination 04/12/2014   Hairy cell leukemia, in remission (HCC)    B12 deficiency    BPH (benign prostatic hyperplasia)    Essential hypertension    GERD (gastroesophageal reflux disease)    Dyslipidemia    Type 2 diabetes mellitus with stage 3 chronic kidney disease (HCC)    Diverticulosis    Macular hole 07/07/2012   PCP:  [facility] Pharmacy:   [facility] [address] [place] [address] Phone: [phone] Fax: [phone]     Social Drivers of Health (SDOH) Social History: SDOH Screenings   Food Insecurity: No Food Insecurity (09/18/2023)  Housing: Low Risk  (09/18/2023)  Transportation Needs: No Transportation Needs (09/18/2023)  Utilities: Not At Risk (09/18/2023)  Depression (PHQ2-9): Medium Risk (03/09/2019)  Social Connections: Unknown (09/18/2023)  Tobacco Use: Medium Risk (09/16/2023)   SDOH Interventions:     Readmission Risk Interventions    04/13/2022    2:31 PM 08/19/2021   10:21 AM 05/11/2021   10:42 AM  Readmission Risk Prevention Plan  Transportation Screening Complete Complete Complete  PCP or Specialist Appt within 3-5 Days Complete    HRI or Home Care Consult Complete    Social Work Consult for Recovery Care Planning/Counseling Complete    Palliative Care Screening Complete    Medication Review Oceanographer) Complete Complete Complete  PCP or Specialist appointment within 3-5 days of discharge  Complete Complete  HRI  or Home Care Consult  Complete Complete  SW Recovery Care/Counseling Consult  Complete Complete  Palliative Care Screening  Not Applicable Complete  Skilled Nursing Facility  Complete Complete

## 2023-09-18 NOTE — Discharge Summary (Addendum)
Physician Discharge Summary   Patient: Alex Smith MRN: [id number] DOB: 1935-02-06  Admit date:     09/17/2023  Discharge date: 09/18/23  Discharge Physician: Jonah Blue   PCP: [facility]   Recommendations at discharge:   Isolate due to Influenza A as per assisted living facility policies Complete Tamiflu; ok to stop if no longer needed or side effects develop Ambulate with assistance/walker and implement fall precautions  Discharge Diagnoses: Principal Problem:   Influenza A Active Problems:   Generalized weakness   SIRS (systemic inflammatory response syndrome) (HCC)   Alzheimer's dementia (HCC)   Type 2 diabetes mellitus with stage 3 chronic kidney disease (HCC)   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  He was started on Tamiflu.  Assessment and Plan:  Influenza A  + generalized weakness and malaise in setting of influenza A No hypoxia   CXR unremarkable Treat with Tamiflu (risks/benefits discussed with his daughter)    SIRS (systemic inflammatory response syndrome) Meeting SIRS criteria w/ Tmax 100.5, HR transiently in 100s  WBC 2.4  Lactate 2.1 and stable  Influenza A +  Sepsis ruled out   Generalized weakness  PT/OT consults PT actually assessed him a month ago during his last hospitalization and reports that he is performing similarly to that evaluation (although no longer needing O2) He remains a fall risk and must be cautious upon his return to the facility   Alzheimer's dementia Continue aricept    CKD (chronic kidney disease) stage 3, GFR 30-59 ml/min  Cr 1.5 w/ GFR in 30s-40s  Borderline 3a/3b At/near baseline Avoid nephrotoxic agents   Myelodysplasia  with pancytopenia Cell lines appear to be slightly worse than baseline Monitor as an outpatient   Type 2 diabetes mellitus with stage 3 chronic kidney disease Recent A1c 7.2, good control Continue Lantus, SSI  HTN Continue amlodipine, losartan, torsemide  HLD Continue pravastatin   Pressure ulcer Pressure Injury 09/17/23 Sacrum Mid;Upper Stage 2 -  Partial thickness loss of dermis presenting as a shallow open injury with a red, pink wound bed without slough. Blister (Active)  09/17/23 2200  Location: Sacrum  Location Orientation: Mid;Upper  Staging: Stage 2 -  Partial thickness loss of dermis presenting as a shallow open injury with a red, pink wound bed without slough.  Wound Description (Comments): Blister  Present on Admission: Yes    DNR  Confirmed on admission [facility] documents/MOST form reviewed, preference for comfort    Pain control - [place] Controlled Substance Reporting System database was reviewed. and patient was instructed, not to drive, operate heavy machinery, perform activities at heights, swimming or participation in water activities or provide baby-sitting services while on Pain, Sleep and Anxiety Medications; until their outpatient Physician has advised to do so again. Also recommended to not to take more than prescribed Pain, Sleep and Anxiety Medications.    Disposition: Assisted living Diet recommendation:  Carb modified diet DISCHARGE MEDICATION: Allergies as of 09/18/2023       Reactions   Rituximab Rash   Chest tightness Chest tightness   Blood-group Specific Substance Other (See Comments)   Had a post transfusion reaction of red blood cells; NOW REQUIRES WASHED BLOOD CELLS Had a  post transfusion reaction of red blood cells; NOW REQUIRES WASHED BLOOD CELLS   Primaxin [imipenem] Other (See Comments)   Possible allergy Tolerates cephalosporins   Voriconazole Other (See Comments)   Sulfa Antibiotics Itching, Rash   Sulfacetamide Sodium  Itching, Rash        Medication List     STOP taking these medications    traZODone 50 MG tablet Commonly known as: DESYREL       TAKE these medications    acetaminophen 500 MG tablet Commonly known as: TYLENOL Take 2 tablets (1,000 mg total) by mouth 2 (two) times daily.   albuterol 108 (90 Base) MCG/ACT inhaler Commonly known as: VENTOLIN HFA Inhale 2 puffs into the lungs every 6 (six) hours as needed for wheezing or shortness of breath.   alum & mag hydroxide-simeth 200-200-20 MG/5ML suspension Commonly known as: MAALOX/MYLANTA Take 30 mLs by mouth every 4 (four) hours as needed for indigestion or heartburn.   amLODipine 10 MG tablet Commonly known as: NORVASC Take 1 tablet (10 mg total) by mouth daily.   aspirin EC 81 MG tablet Take 81 mg by mouth daily.   cyanocobalamin 1000 MCG tablet Commonly known as: VITAMIN B12 Take 1 tablet (1,000 mcg total) by mouth daily.   donepezil 5 MG disintegrating tablet Commonly known as: ARICEPT ODT Take 5 mg by mouth daily.   famotidine 20 MG tablet Commonly known as: PEPCID Take 20 mg by mouth daily.   insulin glargine 100 UNIT/ML Solostar Pen Commonly known as: LANTUS Inject 14 Units into the skin at bedtime.   insulin lispro 100 UNIT/ML KwikPen Commonly known as: HUMALOG Inject 2 Units into the skin 3 (three) times daily before meals. Per sliding scale What changed: Another medication with the same name was removed. Continue taking this medication, and follow the directions you see here.   loperamide 2 MG tablet Commonly known as: IMODIUM A-D Take 2 mg by mouth 4 (four) times daily as needed for diarrhea or loose stools.   losartan 25 MG tablet Commonly known as: Cozaar Take 1 tablet (25 mg total) by mouth daily.   magnesium hydroxide 400 MG/5ML suspension Commonly known as: MILK OF MAGNESIA Take 5 mLs by mouth daily as needed for mild constipation.   melatonin 5 MG Tabs Take 10 mg by mouth at bedtime.    multivitamin with minerals Tabs tablet Take 1 tablet by mouth daily.   oseltamivir 30 MG capsule Commonly known as: TAMIFLU Take 1 capsule (30 mg total) by mouth 2 (two) times daily for 8 doses.   pantoprazole 40 MG tablet Commonly known as: PROTONIX Take 1 tablet (40 mg total) by mouth 2 (two) times daily.   pravastatin 20 MG tablet Commonly known as: PRAVACHOL Take 20 mg by mouth daily.   sertraline 25 MG tablet Commonly known as: ZOLOFT Take 25 mg by mouth daily.   tamsulosin 0.4 MG Caps capsule Commonly known as: FLOMAX TAKE 1 CAPSULE BY MOUTH EVERY DAY   torsemide 20 MG tablet Commonly known as: DEMADEX Take 20 mg by mouth daily.   triamcinolone cream 0.1 % Commonly known as: KENALOG Apply 1 Application topically 2 (two) times daily. Apply liberally to skin bid to bilateral lower legs   vitamin E 180 MG (400 UNITS) capsule Take 400 Units by mouth daily.               Discharge Care Instructions  (From admission, onward)           Start  Ordered   09/18/23 0000  Discharge wound care:       Comments: Monitor sacral pressure ulcer stage 2 per facility protocols   09/18/23 1402            Discharge Exam:    Subjective: Feeling good, no complaints. On RA.  Ambulated without difficulty.  Daughter is concerned about his fall risk.   Objective: Vitals:   09/18/23 0754 09/18/23 1027  BP: (!) 174/57   Pulse: 69   Resp: 20   Temp: 98.1 F (36.7 C)   SpO2: 96% 94%    Intake/Output Summary (Last 24 hours) at 09/18/2023 1416 Last data filed at 09/18/2023 0840 Gross per 24 hour  Intake 1941.65 ml  Output --  Net 1941.65 ml   Filed Weights   09/16/23 1715  Weight: 88.5 kg    Exam:  General:  Appears calm and comfortable and is in NAD, walked to chair with PT assistance using walker and then sat comfortably on RA Eyes:   EOMI, normal lids, iris ENT:  grossly normal hearing, lips & tongue, mmm Neck:  no LAD, masses or  thyromegaly Cardiovascular:  RRR, no m/r/g. No LE edema.  Respiratory:   CTA bilaterally with no wheezes/rales/rhonchi.  Normal respiratory effort. Abdomen:  soft, NT, ND Skin:  no rash or induration seen on limited exam Musculoskeletal:  grossly normal tone BUE/BLE, good ROM, no bony abnormality Psychiatric:  grossly normal mood and affect, speech fluent and appropriate Neurologic:  CN 2-12 grossly intact, moves all extremities in coordinated fashion  Data Reviewed: I have reviewed the patient's lab results since admission.  Pertinent labs for today include:  Glucose 137 BUN 18/Creatinine 1.30/GFR 54, at baseline Albumin 2.9 Lactate 2.1, 2.2 WBC 2 Hgb 7.9 Platelets 54      Condition at discharge: stable  The results of significant diagnostics from this hospitalization (including imaging, microbiology, ancillary and laboratory) are listed below for reference.   Imaging Studies: DG Chest 2 View Result Date: 09/16/2023 CLINICAL DATA:  Weakness and fever. EXAM: CHEST - 2 VIEW COMPARISON:  08/13/2023 FINDINGS: The heart size and mediastinal contours are within normal limits. Stable bilateral pulmonary scarring and atelectasis with some potential mild increased atelectasis at the right lung base. There is no evidence of pulmonary edema, consolidation, pneumothorax, nodule or pleural fluid. The visualized skeletal structures are unremarkable. IMPRESSION: Stable bilateral pulmonary scarring and atelectasis with some potential mild increased atelectasis at the right lung base. Electronically Signed   By: Irish Lack M.D.   On: 09/16/2023 17:48    Microbiology: Results for orders placed or performed during the hospital encounter of 09/17/23  SARS Coronavirus 2 by RT PCR (hospital order, performed in [facility] hospital lab) *cepheid single result test* Anterior Nasal Swab     Status: None   Collection Time: 09/16/23  5:16 PM   Specimen: Anterior Nasal Swab  Result Value Ref Range  Status   SARS Coronavirus 2 by RT PCR NEGATIVE NEGATIVE Final    Comment: (NOTE) SARS-CoV-2 target nucleic acids are NOT DETECTED.  The SARS-CoV-2 RNA is generally detectable in upper and lower respiratory specimens during the acute phase of infection. The lowest concentration of SARS-CoV-2 viral copies this assay can detect is 250 copies / mL. A negative result does not preclude SARS-CoV-2 infection and should not be used as the sole basis for treatment or other patient management decisions.  A negative result may occur with improper specimen collection / handling, submission of specimen other than  nasopharyngeal swab, presence of viral mutation(s) within the areas targeted by this assay, and inadequate number of viral copies (<250 copies / mL). A negative result must be combined with clinical observations, patient history, and epidemiological information.  Fact Sheet for Patients:   RoadLapTop.co.za  Fact Sheet for Healthcare Providers: http://kim-miller.com/  This test is not yet approved or  cleared by the [place] FDA and has been authorized for detection and/or diagnosis of SARS-CoV-2 by FDA under an Emergency Use Authorization (EUA).  This EUA will remain in effect (meaning this test can be used) for the duration of the COVID-19 declaration under Section 564(b)(1) of the Act, 21 U.S.C. section 360bbb-3(b)(1), unless the authorization is terminated or revoked sooner.  Performed at [facility], [address] [place], [place] [address]   Resp panel by RT-PCR (RSV, Flu A&B, Covid) Anterior Nasal Swab     Status: Abnormal   Collection Time: 09/16/23  5:16 PM   Specimen: Anterior Nasal Swab  Result Value Ref Range Status   SARS Coronavirus 2 by RT PCR NEGATIVE NEGATIVE Final    Comment: (NOTE) SARS-CoV-2 target nucleic acids are NOT DETECTED.  The SARS-CoV-2 RNA is generally detectable in upper  respiratory specimens during the acute phase of infection. The lowest concentration of SARS-CoV-2 viral copies this assay can detect is 138 copies/mL. A negative result does not preclude SARS-Cov-2 infection and should not be used as the sole basis for treatment or other patient management decisions. A negative result may occur with  improper specimen collection/handling, submission of specimen other than nasopharyngeal swab, presence of viral mutation(s) within the areas targeted by this assay, and inadequate number of viral copies(<138 copies/mL). A negative result must be combined with clinical observations, patient history, and epidemiological information. The expected result is Negative.  Fact Sheet for Patients:  BloggerCourse.com  Fact Sheet for Healthcare Providers:  SeriousBroker.it  This test is no t yet approved or cleared by the [place] FDA and  has been authorized for detection and/or diagnosis of SARS-CoV-2 by FDA under an Emergency Use Authorization (EUA). This EUA will remain  in effect (meaning this test can be used) for the duration of the COVID-19 declaration under Section 564(b)(1) of the Act, 21 U.S.C.section 360bbb-3(b)(1), unless the authorization is terminated  or revoked sooner.       Influenza A by PCR POSITIVE (A) NEGATIVE Final   Influenza B by PCR NEGATIVE NEGATIVE Final    Comment: (NOTE) The Xpert Xpress SARS-CoV-2/FLU/RSV plus assay is intended as an aid in the diagnosis of influenza from Nasopharyngeal swab specimens and should not be used as a sole basis for treatment. Nasal washings and aspirates are unacceptable for Xpert Xpress SARS-CoV-2/FLU/RSV testing.  Fact Sheet for Patients: BloggerCourse.com  Fact Sheet for Healthcare Providers: SeriousBroker.it  This test is not yet approved or cleared by the [place] FDA and has been  authorized for detection and/or diagnosis of SARS-CoV-2 by FDA under an Emergency Use Authorization (EUA). This EUA will remain in effect (meaning this test can be used) for the duration of the COVID-19 declaration under Section 564(b)(1) of the Act, 21 U.S.C. section 360bbb-3(b)(1), unless the authorization is terminated or revoked.     Resp Syncytial Virus by PCR NEGATIVE NEGATIVE Final    Comment: (NOTE) Fact Sheet for Patients: BloggerCourse.com  Fact Sheet for Healthcare Providers: SeriousBroker.it  This test is not yet approved or cleared by the [place] FDA and has been authorized for detection and/or diagnosis of SARS-CoV-2  by FDA under an Emergency Use Authorization (EUA). This EUA will remain in effect (meaning this test can be used) for the duration of the COVID-19 declaration under Section 564(b)(1) of the Act, 21 U.S.C. section 360bbb-3(b)(1), unless the authorization is terminated or revoked.  Performed at [facility], [address] [place], [place] [address]    *Note: Due to a large number of results and/or encounters for the requested time period, some results have not been displayed. A complete set of results can be found in Results Review.    Labs: CBC: Recent Labs  Lab 09/16/23 1721 09/18/23 0459  WBC 2.4* 2.0*  NEUTROABS 1.5*  --   HGB 9.8* 7.9*  HCT 29.6* 24.2*  MCV 105.3* 105.7*  PLT 79* 54*   Basic Metabolic Panel: Recent Labs  Lab 09/16/23 1721 09/18/23 0459  NA 136 137  K 3.9 3.6  CL 99 107  CO2 23 22  GLUCOSE 183* 137*  BUN 19 18  CREATININE 1.54* 1.30*  CALCIUM 8.9 8.0*   Liver Function Tests: Recent Labs  Lab 09/16/23 1721 09/18/23 0459  AST 26 22  ALT 12 12  ALKPHOS 71 45  BILITOT 0.9 0.8  PROT 8.0 6.2*  ALBUMIN 4.2 2.9*   CBG: Recent Labs  Lab 09/17/23 1208 09/17/23 1706 09/17/23 2140 09/18/23 0755 09/18/23 1203  GLUCAP 185* 145* 138* 180* 149*     Discharge time spent: greater than 30 minutes.  Signed: Jonah Blue, MD [facility] 09/18/2023

## 2023-09-18 NOTE — Progress Notes (Signed)
Some of the admission questions could not be completed due to the patient's cognitive limitations and memory impairment. The patient has a known history of dementia.

## 2023-09-19 DIAGNOSIS — E782 Mixed hyperlipidemia: Secondary | ICD-10-CM | POA: Diagnosis not present

## 2023-09-19 DIAGNOSIS — M159 Polyosteoarthritis, unspecified: Secondary | ICD-10-CM | POA: Diagnosis not present

## 2023-09-19 LAB — RESPIRATORY PANEL BY PCR

## 2023-09-23 DIAGNOSIS — D469 Myelodysplastic syndrome, unspecified: Secondary | ICD-10-CM | POA: Diagnosis not present

## 2023-09-23 DIAGNOSIS — I10 Essential (primary) hypertension: Secondary | ICD-10-CM | POA: Diagnosis not present

## 2023-09-23 DIAGNOSIS — L89152 Pressure ulcer of sacral region, stage 2: Secondary | ICD-10-CM | POA: Insufficient documentation

## 2023-09-23 DIAGNOSIS — R2681 Unsteadiness on feet: Secondary | ICD-10-CM | POA: Diagnosis not present

## 2023-09-23 DIAGNOSIS — G309 Alzheimer's disease, unspecified: Secondary | ICD-10-CM | POA: Diagnosis not present

## 2023-09-23 DIAGNOSIS — E1122 Type 2 diabetes mellitus with diabetic chronic kidney disease: Secondary | ICD-10-CM | POA: Diagnosis not present

## 2023-09-23 DIAGNOSIS — J101 Influenza due to other identified influenza virus with other respiratory manifestations: Secondary | ICD-10-CM | POA: Diagnosis not present

## 2023-09-23 DIAGNOSIS — R6510 Systemic inflammatory response syndrome (SIRS) of non-infectious origin without acute organ dysfunction: Secondary | ICD-10-CM | POA: Insufficient documentation

## 2023-09-23 DIAGNOSIS — N183 Chronic kidney disease, stage 3 unspecified: Secondary | ICD-10-CM | POA: Diagnosis not present

## 2023-09-23 DIAGNOSIS — R531 Weakness: Secondary | ICD-10-CM | POA: Diagnosis not present

## 2023-09-23 DIAGNOSIS — E785 Hyperlipidemia, unspecified: Secondary | ICD-10-CM | POA: Diagnosis not present

## 2023-09-24 ENCOUNTER — Inpatient Hospital Stay: Payer: PPO | Attending: Nurse Practitioner

## 2023-09-24 VITALS — BP 120/68

## 2023-09-24 DIAGNOSIS — D46Z Other myelodysplastic syndromes: Secondary | ICD-10-CM | POA: Diagnosis not present

## 2023-09-24 DIAGNOSIS — Z5111 Encounter for antineoplastic chemotherapy: Secondary | ICD-10-CM | POA: Insufficient documentation

## 2023-09-24 MED ORDER — LUSPATERCEPT-AAMT 75 MG ~~LOC~~ SOLR
100.0000 mg | SUBCUTANEOUS | Status: DC
Start: 2023-09-24 — End: 2023-09-24
  Administered 2023-09-24: 100 mg via SUBCUTANEOUS
  Filled 2023-09-24: qty 0.5

## 2023-09-24 NOTE — Progress Notes (Signed)
Per Dr Smith Robert, continue same Reblozyl dose today.  Will repeat CBC soon.  Ebony Hail, Pharm.D., CPP 09/24/2023@11 :14 AM

## 2023-10-01 ENCOUNTER — Inpatient Hospital Stay: Payer: PPO

## 2023-10-01 DIAGNOSIS — G309 Alzheimer's disease, unspecified: Secondary | ICD-10-CM | POA: Diagnosis not present

## 2023-10-01 DIAGNOSIS — F331 Major depressive disorder, recurrent, moderate: Secondary | ICD-10-CM | POA: Diagnosis not present

## 2023-10-02 DIAGNOSIS — F331 Major depressive disorder, recurrent, moderate: Secondary | ICD-10-CM | POA: Diagnosis not present

## 2023-10-02 DIAGNOSIS — G309 Alzheimer's disease, unspecified: Secondary | ICD-10-CM | POA: Diagnosis not present

## 2023-10-02 DIAGNOSIS — G4700 Insomnia, unspecified: Secondary | ICD-10-CM | POA: Diagnosis not present

## 2023-10-08 ENCOUNTER — Inpatient Hospital Stay: Payer: PPO

## 2023-10-13 DIAGNOSIS — M159 Polyosteoarthritis, unspecified: Secondary | ICD-10-CM | POA: Diagnosis not present

## 2023-10-13 DIAGNOSIS — E782 Mixed hyperlipidemia: Secondary | ICD-10-CM | POA: Diagnosis not present

## 2023-10-14 DIAGNOSIS — N183 Chronic kidney disease, stage 3 unspecified: Secondary | ICD-10-CM | POA: Diagnosis not present

## 2023-10-14 DIAGNOSIS — Z87891 Personal history of nicotine dependence: Secondary | ICD-10-CM | POA: Insufficient documentation

## 2023-10-14 DIAGNOSIS — R269 Unspecified abnormalities of gait and mobility: Secondary | ICD-10-CM | POA: Diagnosis not present

## 2023-10-14 DIAGNOSIS — I739 Peripheral vascular disease, unspecified: Secondary | ICD-10-CM | POA: Diagnosis not present

## 2023-10-14 DIAGNOSIS — I129 Hypertensive chronic kidney disease with stage 1 through stage 4 chronic kidney disease, or unspecified chronic kidney disease: Secondary | ICD-10-CM | POA: Diagnosis not present

## 2023-10-14 DIAGNOSIS — E782 Mixed hyperlipidemia: Secondary | ICD-10-CM | POA: Diagnosis not present

## 2023-10-14 DIAGNOSIS — E1122 Type 2 diabetes mellitus with diabetic chronic kidney disease: Secondary | ICD-10-CM | POA: Diagnosis not present

## 2023-10-15 ENCOUNTER — Inpatient Hospital Stay: Payer: PPO

## 2023-10-15 VITALS — BP 153/67

## 2023-10-15 DIAGNOSIS — E538 Deficiency of other specified B group vitamins: Secondary | ICD-10-CM

## 2023-10-15 DIAGNOSIS — D46Z Other myelodysplastic syndromes: Secondary | ICD-10-CM

## 2023-10-15 DIAGNOSIS — Z5111 Encounter for antineoplastic chemotherapy: Secondary | ICD-10-CM | POA: Diagnosis not present

## 2023-10-15 DIAGNOSIS — N1831 Chronic kidney disease, stage 3a: Secondary | ICD-10-CM

## 2023-10-15 DIAGNOSIS — D649 Anemia, unspecified: Secondary | ICD-10-CM

## 2023-10-15 LAB — CBC WITH DIFFERENTIAL/PLATELET
Abs Immature Granulocytes: 0.01 10*3/uL (ref 0.00–0.07)
Basophils Absolute: 0 10*3/uL (ref 0.0–0.1)
Basophils Relative: 1 %
Eosinophils Absolute: 0.1 10*3/uL (ref 0.0–0.5)
Eosinophils Relative: 3 %
HCT: 25.1 % — ABNORMAL LOW (ref 39.0–52.0)
Hemoglobin: 8 g/dL — ABNORMAL LOW (ref 13.0–17.0)
Immature Granulocytes: 0 %
Lymphocytes Relative: 29 %
Lymphs Abs: 1 10*3/uL (ref 0.7–4.0)
MCH: 34.2 pg — ABNORMAL HIGH (ref 26.0–34.0)
MCHC: 31.9 g/dL (ref 30.0–36.0)
MCV: 107.3 fL — ABNORMAL HIGH (ref 80.0–100.0)
Monocytes Absolute: 0.2 10*3/uL (ref 0.1–1.0)
Monocytes Relative: 7 %
Neutro Abs: 2.1 10*3/uL (ref 1.7–7.7)
Neutrophils Relative %: 60 %
Platelets: 79 10*3/uL — ABNORMAL LOW (ref 150–400)
RBC: 2.34 MIL/uL — ABNORMAL LOW (ref 4.22–5.81)
RDW: 18.2 % — ABNORMAL HIGH (ref 11.5–15.5)
WBC: 3.4 10*3/uL — ABNORMAL LOW (ref 4.0–10.5)
nRBC: 0.6 % — ABNORMAL HIGH (ref 0.0–0.2)

## 2023-10-15 MED ORDER — LUSPATERCEPT-AAMT 75 MG ~~LOC~~ SOLR
100.0000 mg | SUBCUTANEOUS | Status: DC
  Administered 2023-10-15: 100 mg via SUBCUTANEOUS
  Filled 2023-10-15: qty 1.5

## 2023-10-16 DIAGNOSIS — K115 Sialolithiasis: Secondary | ICD-10-CM | POA: Diagnosis not present

## 2023-10-16 LAB — KAPPA/LAMBDA LIGHT CHAINS
Kappa free light chain: 55.5 mg/L — ABNORMAL HIGH (ref 3.3–19.4)
Kappa, lambda light chain ratio: 3.88 — ABNORMAL HIGH (ref 0.26–1.65)
Lambda free light chains: 14.3 mg/L (ref 5.7–26.3)

## 2023-10-19 LAB — MULTIPLE MYELOMA PANEL, SERUM
Albumin SerPl Elph-Mcnc: 3.6 g/dL (ref 2.9–4.4)
Albumin/Glob SerPl: 1.3 (ref 0.7–1.7)
Alpha 1: 0.2 g/dL (ref 0.0–0.4)
Alpha2 Glob SerPl Elph-Mcnc: 0.5 g/dL (ref 0.4–1.0)
B-Globulin SerPl Elph-Mcnc: 0.6 g/dL — ABNORMAL LOW (ref 0.7–1.3)
Gamma Glob SerPl Elph-Mcnc: 1.6 g/dL (ref 0.4–1.8)
Globulin, Total: 3 g/dL (ref 2.2–3.9)
IgA: 17 mg/dL — ABNORMAL LOW (ref 61–437)
IgG (Immunoglobin G), Serum: 1907 mg/dL — ABNORMAL HIGH (ref 603–1613)
IgM (Immunoglobulin M), Srm: 26 mg/dL (ref 15–143)
M Protein SerPl Elph-Mcnc: 1.2 g/dL — ABNORMAL HIGH
Total Protein ELP: 6.6 g/dL (ref 6.0–8.5)

## 2023-10-22 ENCOUNTER — Other Ambulatory Visit: Payer: PPO

## 2023-10-22 ENCOUNTER — Ambulatory Visit: Payer: PPO | Admitting: Oncology

## 2023-10-22 ENCOUNTER — Inpatient Hospital Stay: Payer: PPO

## 2023-10-22 DIAGNOSIS — G309 Alzheimer's disease, unspecified: Secondary | ICD-10-CM | POA: Diagnosis not present

## 2023-10-22 DIAGNOSIS — F331 Major depressive disorder, recurrent, moderate: Secondary | ICD-10-CM | POA: Diagnosis not present

## 2023-10-29 ENCOUNTER — Ambulatory Visit: Payer: PPO | Admitting: Oncology

## 2023-10-29 ENCOUNTER — Inpatient Hospital Stay: Payer: PPO

## 2023-10-29 ENCOUNTER — Other Ambulatory Visit: Payer: PPO

## 2023-11-04 ENCOUNTER — Other Ambulatory Visit: Payer: Self-pay

## 2023-11-05 ENCOUNTER — Inpatient Hospital Stay: Payer: PPO | Attending: Nurse Practitioner

## 2023-11-05 ENCOUNTER — Encounter: Payer: Self-pay | Admitting: Oncology

## 2023-11-05 ENCOUNTER — Inpatient Hospital Stay: Payer: PPO

## 2023-11-05 ENCOUNTER — Inpatient Hospital Stay (HOSPITAL_BASED_OUTPATIENT_CLINIC_OR_DEPARTMENT_OTHER): Payer: PPO | Admitting: Oncology

## 2023-11-05 VITALS — BP 133/72 | HR 79 | Temp 98.4°F | Resp 18 | Ht 66.0 in | Wt 196.0 lb

## 2023-11-05 DIAGNOSIS — D46Z Other myelodysplastic syndromes: Secondary | ICD-10-CM

## 2023-11-05 DIAGNOSIS — Z5111 Encounter for antineoplastic chemotherapy: Secondary | ICD-10-CM | POA: Insufficient documentation

## 2023-11-05 DIAGNOSIS — N1831 Chronic kidney disease, stage 3a: Secondary | ICD-10-CM

## 2023-11-05 DIAGNOSIS — Z79899 Other long term (current) drug therapy: Secondary | ICD-10-CM | POA: Diagnosis not present

## 2023-11-05 DIAGNOSIS — D472 Monoclonal gammopathy: Secondary | ICD-10-CM

## 2023-11-05 LAB — CBC WITH DIFFERENTIAL/PLATELET
Abs Immature Granulocytes: 0.01 10*3/uL (ref 0.00–0.07)
Basophils Absolute: 0 10*3/uL (ref 0.0–0.1)
Basophils Relative: 1 %
Eosinophils Absolute: 0.1 10*3/uL (ref 0.0–0.5)
Eosinophils Relative: 3 %
HCT: 29.1 % — ABNORMAL LOW (ref 39.0–52.0)
Hemoglobin: 9.3 g/dL — ABNORMAL LOW (ref 13.0–17.0)
Immature Granulocytes: 0 %
Lymphocytes Relative: 28 %
Lymphs Abs: 1 10*3/uL (ref 0.7–4.0)
MCH: 34.4 pg — ABNORMAL HIGH (ref 26.0–34.0)
MCHC: 32 g/dL (ref 30.0–36.0)
MCV: 107.8 fL — ABNORMAL HIGH (ref 80.0–100.0)
Monocytes Absolute: 0.2 10*3/uL (ref 0.1–1.0)
Monocytes Relative: 6 %
Neutro Abs: 2.2 10*3/uL (ref 1.7–7.7)
Neutrophils Relative %: 62 %
Platelets: 97 10*3/uL — ABNORMAL LOW (ref 150–400)
RBC: 2.7 MIL/uL — ABNORMAL LOW (ref 4.22–5.81)
RDW: 17.5 % — ABNORMAL HIGH (ref 11.5–15.5)
WBC: 3.6 10*3/uL — ABNORMAL LOW (ref 4.0–10.5)
nRBC: 0.6 % — ABNORMAL HIGH (ref 0.0–0.2)

## 2023-11-05 LAB — COMPREHENSIVE METABOLIC PANEL
ALT: 14 U/L (ref 0–44)
AST: 17 U/L (ref 15–41)
Albumin: 3.7 g/dL (ref 3.5–5.0)
Alkaline Phosphatase: 79 U/L (ref 38–126)
Anion gap: 5 (ref 5–15)
BUN: 30 mg/dL — ABNORMAL HIGH (ref 8–23)
CO2: 21 mmol/L — ABNORMAL LOW (ref 22–32)
Calcium: 8.3 mg/dL — ABNORMAL LOW (ref 8.9–10.3)
Chloride: 107 mmol/L (ref 98–111)
Creatinine, Ser: 1.71 mg/dL — ABNORMAL HIGH (ref 0.61–1.24)
GFR, Estimated: 38 mL/min — ABNORMAL LOW (ref >60)
Glucose, Bld: 300 mg/dL — ABNORMAL HIGH (ref 70–99)
Potassium: 3.8 mmol/L (ref 3.5–5.1)
Sodium: 133 mmol/L — ABNORMAL LOW (ref 135–145)
Total Bilirubin: 1 mg/dL (ref 0.0–1.2)
Total Protein: 7.5 g/dL (ref 6.5–8.1)

## 2023-11-05 MED ORDER — LUSPATERCEPT-AAMT 75 MG ~~LOC~~ SOLR
100.0000 mg | SUBCUTANEOUS | Status: DC
  Administered 2023-11-05: 100 mg via SUBCUTANEOUS
  Filled 2023-11-05: qty 1.5

## 2023-11-05 NOTE — Progress Notes (Unsigned)
 Hematology/Oncology Consult note [facility]  [phone] [phone]  Patient Care Team: [facility] as PCP - General (Geriatric Medicine) Rozetta Nunnery, MD as Referring Physician (Hematology and Oncology) Rozetta Nunnery, MD as Referring Physician (Hematology and Oncology) Creig Hines, MD as Consulting Physician (Oncology)   Name of the patient: Alex Smith  [id number]  May 13, 1935   Date of visit: 11/05/23  Diagnosis- History of hairy cell leukemia 2.  Smoldering multiple myeloma under observation 3.  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 [name] and is transitioning his care to me.  He has following issues:   1.  Hairy cell leukemia that was initially diagnosed on bone marrow biopsy in June 2016.  Bone marrow biopsy at that time showed extensive marrow involvement with recurrent hairy cell leukemia approximately 80% of the cells in the core 10% monoclonal plasma cell infiltrate compatible with plasma cell neoplasm.  Peripheral smear revealed leukopenia with a white count of 1900 compatible with hairy cell leukemia. 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  FISH studies for MDS were negative.  Cytogenetics were normal (46, XY).  He has last received cladribine for this back in July 2016   2.  Smoldering multiple myeloma: Bone marrow biopsy in March 2017 revealed persistent plasma cell neoplasm with monoclonal plasma cells estimated at 20 to 30%.  No morphologic evidence of residual hairy cell leukemia.Marrow was normocellular for age with relative erythroid hyperplasia, relative myeloid hypoplasia, mild dyspoiesis, adequate megakaryocytes and no increased blasts.  There was no significant increase in  marrow reticulin fibers.  Iron was present.  FISH studies for MDS were negative.  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  Cytogenetics were normal (46, XY).  Last bone marrow biopsy was in October 2018 which showed variably cellular marrow 10 to 50% with kappa restricted plasmacytosis of 10 to 15%.  No evidence of leukemia lymphoma or high-grade dysplasia.  Normal cytogenetics.  He has also been followed by Dr. [name] at [facility].  He has received Revlimid and Decadron in the past in 2017 which was subsequently stopped.  PET scan in July 2017 showed no hypermetabolic adenopathy or focal skeletal lesions.   3.  MDS: He has received a trial of Procrit in the past in 2017 but apparently did not respond to it.  He has been getting periodic blood transfusions to maintain his hemoglobin closer to 9 as he is symptomatic when his hemoglobin is less than 9.  He has been getting about 2-3 blood transfusions during the year.  He does have some mild leukopenia/neutropenia and is on acyclovir prophylaxis.  He was getting weekly G-CSF when his ANC is less than 1.5.  He is not presently on G-CSF.  He is currently on Luspatercept for his anemia.   4.  Also has history of B12 deficiency for which he is on oral B12.      Interval history-he is doing well at his nursing home.  He was admitted to the hospitalFor influenza in January 2025  ECOG PS- 3 Pain scale- 0  Review of systems- Review of Systems  Constitutional:  Negative for chills, fever, malaise/fatigue and weight loss.  HENT:  Negative for congestion, ear discharge and nosebleeds.  Eyes:  Negative for blurred vision.  Respiratory:  Negative for cough, hemoptysis, sputum production, shortness of breath and wheezing.   Cardiovascular:  Negative for chest pain, palpitations, orthopnea and claudication.  Gastrointestinal:  Negative for abdominal pain, blood in stool, constipation, diarrhea, heartburn, melena,  nausea and vomiting.  Genitourinary:  Negative for dysuria, flank pain, frequency, hematuria and urgency.  Musculoskeletal:  Negative for back pain, joint pain and myalgias.  Skin:  Negative for rash.  Neurological:  Negative for dizziness, tingling, focal weakness, seizures, weakness and headaches.  Endo/Heme/Allergies:  Does not bruise/bleed easily.  Psychiatric/Behavioral:  Negative for depression and suicidal ideas. The patient does not have insomnia.       Allergies  Allergen Reactions   Rituximab Rash    Chest tightness Chest tightness   Blood-Group Specific Substance Other (See Comments)    Had a post transfusion reaction of red blood cells; NOW REQUIRES WASHED BLOOD CELLS Had a post transfusion reaction of red blood cells; NOW REQUIRES WASHED BLOOD CELLS   Primaxin [Imipenem] Other (See Comments)    Possible allergy Tolerates cephalosporins   Voriconazole Other (See Comments)   Sulfa Antibiotics Itching and Rash   Sulfacetamide Sodium Itching and Rash     Past Medical History:  Diagnosis Date   Actinic keratosis    Alzheimer's dementia (HCC) 08/18/2021   Anemia    B12 deficiency    Basal cell carcinoma 03/30/2018   left ant nasal ala   Basal cell carcinoma 06/25/2023   R cheek, schedule for [facility]   Basal cell carcinoma of face 01/19/2013   right distal dorsum nose   BPH (benign prostatic hypertrophy)    followed by urology, discharged (Dr. Lonna Cobb)   CAP (community acquired pneumonia) 02/15/2015   CKD stage 3 due to type 2 diabetes mellitus (HCC) 11/14/2017   Colon polyps    Diverticulosis    Dysplastic nevus 10/27/2006   right med calf   GERD (gastroesophageal reflux disease)    Hairy cell leukemia (HCC) 2006   recurrent, seizure on rituxan, now on cladribine [name])   History of pneumonia 2000's   "once" (07/07/2012)   History of shingles    HLD (hyperlipidemia)    Hypertension    Pneumonia    Shortness of breath dyspnea    Squamous cell carcinoma  in situ 07/05/2020   Left lat. pretibial. 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 EDC 09/07/2020   Squamous cell carcinoma of skin 04/03/2020   Left cheek. WD SCC   Systolic murmur 05/28/2016   Type 2 diabetes, controlled, with retinopathy (HCC)      Past Surgical History:  Procedure Laterality Date   25 GAUGE PARS PLANA VITRECTOMY WITH 20 GAUGE MVR PORT FOR MACULAR HOLE  07/07/2012   Procedure: 25 GAUGE PARS PLANA VITRECTOMY WITH 20 GAUGE MVR PORT FOR MACULAR HOLE;  Surgeon: Sherrie George, MD;  Location: [facility] OR;  Service: Ophthalmology;  Laterality: Left;   BONE MARROW BIOPSY  2016   CARDIOVASCULAR STRESS TEST  2013   treadmill - no evidence ischemia, EF 61%   CATARACT EXTRACTION W/ INTRAOCULAR LENS  IMPLANT, BILATERAL  ~ 2010   COLONOSCOPY  2014   Elliot WNL no rpt needed, h/o polyps   EYE SURGERY Left 06/2012   laser surgery   GAS INSERTION  07/07/2012   Procedure: INSERTION OF GAS;  Surgeon: Sherrie George, MD;  Location: [facility] OR;  Service: Ophthalmology;  Laterality: Left;  [id number]   PERIPHERAL VASCULAR CATHETERIZATION N/A 02/23/2015   Procedure: Shelda Pal Cath Insertion;  Surgeon: Annice Needy, MD;  Location: [facility] INVASIVE CV LAB;  Service: Cardiovascular;  Laterality: N/A;   PORTA CATH REMOVAL N/A 02/16/2020   Procedure: PORTA CATH REMOVAL;  Surgeon: Annice Needy, MD;  Location: [facility] INVASIVE CV LAB;  Service: Cardiovascular;  Laterality: N/A;   SERUM PATCH  07/07/2012   Procedure: SERUM PATCH;  Surgeon: Sherrie George, MD;  Location: [facility] OR;  Service: Ophthalmology;  Laterality: Left;   SKIN CANCER EXCISION     "all over my face" (07/07/2012)    Social History   Socioeconomic History   Marital status: Married    Spouse name: Not on file   Number of children: Not on file   Years of education: Not on file   Highest education level: Not on file  Occupational History   Not on file  Tobacco Use   Smoking status: Former    Current  packs/day: 0.00    Average packs/day: 2.0 packs/day for 25.0 years (50.0 ttl pk-yrs)    Types: Cigarettes    Start date: 02/07/1944    Quit date: 02/06/1969    Years since quitting: 54.7   Smokeless tobacco: Never   Tobacco comments:    07/07/2012 "stopped smoking ~ 40 yr ago; smoked 20-63yr"  Vaping Use   Vaping status: Never Used  Substance and Sexual Activity   Alcohol use: Not Currently   Drug use: No   Sexual activity: Never  Other Topics Concern   Not on file  Social History Narrative   Lives at home alone. 1 cat   Occupation: was Tree surgeon, works part time for [employer]   Activity: likes to golf   Diet: moderate water, fruits/vegetables daily   Social Drivers of Corporate investment banker Strain: Not on file  Food Insecurity: No Food Insecurity (09/18/2023)   Hunger Vital Sign    Worried About Running Out of Food in the Last Year: Never true    Ran Out of Food in the Last Year: Never true  Transportation Needs: No Transportation Needs (09/18/2023)   PRAPARE - Administrator, Civil Service (Medical): No    Lack of Transportation (Non-Medical): No  Physical Activity: Not on file  Stress: Not on file  Social Connections: Unknown (09/18/2023)   Social Connection and Isolation Panel [NHANES]    Frequency of Communication with Friends and Family: Not on file    Frequency of Social Gatherings with Friends and Family: Not on file    Attends Religious Services: Patient unable to answer    Active Member of Clubs or Organizations: Patient unable to answer    Attends Banker Meetings: Patient unable to answer    Marital Status: Not on file  Intimate Partner Violence: Not At Risk (09/18/2023)   Humiliation, Afraid, Rape, and Kick questionnaire    Fear of Current or Ex-Partner: No    Emotionally Abused: No    Physically Abused: No    Sexually Abused: No    Family History  Problem Relation Age of Onset   Dementia Mother    Heart failure Father  53   Cancer Sister        breast   Diabetes Paternal Uncle    Diabetes Paternal Aunt    CAD Brother 22       MI   Stroke Neg Hx      Current Outpatient Medications:  acetaminophen (TYLENOL) 500 MG tablet, Take 2 tablets (1,000 mg total) by mouth 2 (two) times daily., Disp: 120 tablet, Rfl: 0   albuterol (VENTOLIN HFA) 108 (90 Base) MCG/ACT inhaler, Inhale 2 puffs into the lungs every 6 (six) hours as needed for wheezing or shortness of breath., Disp: , Rfl:    alum & mag hydroxide-simeth (MAALOX/MYLANTA) 200-200-20 MG/5ML suspension, Take 30 mLs by mouth every 4 (four) hours as needed for indigestion or heartburn., Disp: 355 mL, Rfl: 0   amLODipine (NORVASC) 10 MG tablet, Take 1 tablet (10 mg total) by mouth daily., Disp: , Rfl:    aspirin EC 81 MG tablet, Take 81 mg by mouth daily., Disp: , Rfl:    donepezil (ARICEPT ODT) 5 MG disintegrating tablet, Take 5 mg by mouth daily., Disp: , Rfl:    famotidine (PEPCID) 20 MG tablet, Take 20 mg by mouth daily., Disp: , Rfl:    insulin glargine (LANTUS) 100 UNIT/ML Solostar Pen, Inject 14 Units into the skin at bedtime., Disp: , Rfl:    insulin lispro (HUMALOG) 100 UNIT/ML KwikPen, Inject 2 Units into the skin 3 (three) times daily before meals. Per sliding scale, Disp: 15 mL, Rfl: 11   loperamide (IMODIUM A-D) 2 MG tablet, Take 2 mg by mouth 4 (four) times daily as needed for diarrhea or loose stools., Disp: , Rfl:    losartan (COZAAR) 25 MG tablet, Take 1 tablet (25 mg total) by mouth daily., Disp: 30 tablet, Rfl: 0   magnesium hydroxide (MILK OF MAGNESIA) 400 MG/5ML suspension, Take 5 mLs by mouth daily as needed for mild constipation., Disp: , Rfl:    melatonin 5 MG TABS, Take 10 mg by mouth at bedtime., Disp: , Rfl:    Multiple Vitamin (MULTIVITAMIN WITH MINERALS) TABS tablet, Take 1 tablet by mouth daily., Disp: , Rfl:    pantoprazole (PROTONIX) 40 MG tablet, Take 1 tablet (40 mg total) by mouth 2 (two) times daily., Disp: , Rfl:     pravastatin (PRAVACHOL) 20 MG tablet, Take 20 mg by mouth daily., Disp: , Rfl:    sertraline (ZOLOFT) 25 MG tablet, Take 25 mg by mouth daily., Disp: , Rfl:    tamsulosin (FLOMAX) 0.4 MG CAPS capsule, TAKE 1 CAPSULE BY MOUTH EVERY DAY, Disp: 30 capsule, Rfl: 3   torsemide (DEMADEX) 20 MG tablet, Take 20 mg by mouth daily., Disp: , Rfl:    triamcinolone cream (KENALOG) 0.1 %, Apply 1 Application topically 2 (two) times daily. Apply liberally to skin bid to bilateral lower legs, Disp: , Rfl:    vitamin B-12 (CYANOCOBALAMIN) 1000 MCG tablet, Take 1 tablet (1,000 mcg total) by mouth daily., Disp: 90 tablet, Rfl: 3   vitamin E 400 UNIT capsule, Take 400 Units by mouth daily., Disp: , Rfl:  No current facility-administered medications for this visit.  Facility-Administered Medications Ordered in Other Visits:    heparin lock flush 100 unit/mL, 500 Units, Intravenous, Once, Corcoran, Melissa C, MD   luspatercept-aamt (REBLOZYL) subcutaneous injection 100 mg, 100 mg, Subcutaneous, Q21 days, Creig Hines, MD, 100 mg at 01/09/23 1428   luspatercept-aamt (REBLOZYL) subcutaneous injection 100 mg, 100 mg, Subcutaneous, Q21 days, Creig Hines, MD, 100 mg at 11/05/23 1125   sodium chloride 0.9 % injection 10 mL, 10 mL, Intravenous, PRN, [name], Melissa C, MD, 10 mL at 03/03/15 0903   sodium chloride 0.9 % injection 10 mL, 10 mL, Intracatheter, PRN, [name], Melissa C, MD, 10 mL at 03/10/15 1410   sodium chloride  flush (NS) 0.9 % injection 10 mL, 10 mL, Intravenous, PRN, [name], Melissa C, MD, 10 mL at 07/23/18 1517  Physical exam:  Vitals:   11/05/23 1035  BP: 133/72  Pulse: 79  Resp: 18  Temp: 98.4 F (36.9 C)  TempSrc: Tympanic  SpO2: 97%  Weight: 196 lb (88.9 kg)  Height: 5\' 6"  (1.676 m)   Physical Exam Cardiovascular:     Rate and Rhythm: Normal rate and regular rhythm.     Heart sounds: Normal heart sounds.  Pulmonary:     Effort: Pulmonary effort is normal.     Breath sounds:  Normal breath sounds.  Abdominal:     General: Bowel sounds are normal.     Palpations: Abdomen is soft.  Musculoskeletal:     Right lower leg: Edema present.     Left lower leg: Edema present.  Skin:    General: Skin is warm and dry.  Neurological:     Mental Status: He is alert and oriented to person, place, and time.         Latest Ref Rng & Units 11/05/2023   10:13 AM  CMP  Glucose 70 - 99 mg/dL 161   BUN 8 - 23 mg/dL 30   Creatinine 0.96 - 1.24 mg/dL 0.45   Sodium 409 - 811 mmol/L 133   Potassium 3.5 - 5.1 mmol/L 3.8   Chloride 98 - 111 mmol/L 107   CO2 22 - 32 mmol/L 21   Calcium 8.9 - 10.3 mg/dL 8.3   Total Protein 6.5 - 8.1 g/dL 7.5   Total Bilirubin 0.0 - 1.2 mg/dL 1.0   Alkaline Phos 38 - 126 U/L 79   AST 15 - 41 U/L 17   ALT 0 - 44 U/L 14       Latest Ref Rng & Units 11/05/2023   10:13 AM  CBC  WBC 4.0 - 10.5 K/uL 3.6   Hemoglobin 13.0 - 17.0 g/dL 9.3   Hematocrit 91.4 - 52.0 % 29.1   Platelets 150 - 400 K/uL 97     Assessment and plan- Patient is a 89 y.o. male with history of Low-grade MDS currently on Luspatercept here for routine follow-up  Patient's hemoglobin has improved to 9.3 after his recent hospitalization when it was in the eights.  Will plan to give him Luspatercept every 4 weeks at this time.  I will see him back in 3 months with CBC with differential CMP myeloma panel and serum free light chains.  He has a history of smoldering multiple myeloma which has remained stable over the years and has not required any treatment so far.   Visit Diagnosis 1. Smoldering myeloma   2. Myelodysplastic syndrome, low grade (HCC)   3. High risk medication use      Dr. Owens Shark, MD, MPH [facility] [phone] 11/05/2023 3:14 PM

## 2023-11-06 ENCOUNTER — Encounter: Payer: Self-pay | Admitting: Hematology and Oncology

## 2023-11-06 DIAGNOSIS — F331 Major depressive disorder, recurrent, moderate: Secondary | ICD-10-CM | POA: Diagnosis not present

## 2023-11-06 DIAGNOSIS — G4700 Insomnia, unspecified: Secondary | ICD-10-CM | POA: Diagnosis not present

## 2023-11-06 DIAGNOSIS — G309 Alzheimer's disease, unspecified: Secondary | ICD-10-CM | POA: Diagnosis not present

## 2023-11-06 LAB — KAPPA/LAMBDA LIGHT CHAINS
Kappa free light chain: 57 mg/L — ABNORMAL HIGH (ref 3.3–19.4)
Kappa, lambda light chain ratio: 3.37 — ABNORMAL HIGH (ref 0.26–1.65)
Lambda free light chains: 16.9 mg/L (ref 5.7–26.3)

## 2023-11-10 LAB — MULTIPLE MYELOMA PANEL, SERUM
Albumin SerPl Elph-Mcnc: 3.7 g/dL (ref 2.9–4.4)
Albumin/Glob SerPl: 1.1 (ref 0.7–1.7)
Alpha 1: 0.3 g/dL (ref 0.0–0.4)
Alpha2 Glob SerPl Elph-Mcnc: 0.7 g/dL (ref 0.4–1.0)
B-Globulin SerPl Elph-Mcnc: 0.8 g/dL (ref 0.7–1.3)
Gamma Glob SerPl Elph-Mcnc: 1.8 g/dL (ref 0.4–1.8)
Globulin, Total: 3.6 g/dL (ref 2.2–3.9)
IgA: 21 mg/dL — ABNORMAL LOW (ref 61–437)
IgG (Immunoglobin G), Serum: 2252 mg/dL — ABNORMAL HIGH (ref 603–1613)
IgM (Immunoglobulin M), Srm: 33 mg/dL (ref 15–143)
M Protein SerPl Elph-Mcnc: 1.4 g/dL — ABNORMAL HIGH
Total Protein ELP: 7.3 g/dL (ref 6.0–8.5)

## 2023-11-11 ENCOUNTER — Encounter (INDEPENDENT_AMBULATORY_CARE_PROVIDER_SITE_OTHER): Payer: PPO | Admitting: Ophthalmology

## 2023-11-11 DIAGNOSIS — I129 Hypertensive chronic kidney disease with stage 1 through stage 4 chronic kidney disease, or unspecified chronic kidney disease: Secondary | ICD-10-CM | POA: Diagnosis not present

## 2023-11-11 DIAGNOSIS — H43811 Vitreous degeneration, right eye: Secondary | ICD-10-CM | POA: Diagnosis not present

## 2023-11-11 DIAGNOSIS — H35033 Hypertensive retinopathy, bilateral: Secondary | ICD-10-CM | POA: Diagnosis not present

## 2023-11-11 DIAGNOSIS — E113392 Type 2 diabetes mellitus with moderate nonproliferative diabetic retinopathy without macular edema, left eye: Secondary | ICD-10-CM | POA: Diagnosis not present

## 2023-11-11 DIAGNOSIS — D469 Myelodysplastic syndrome, unspecified: Secondary | ICD-10-CM | POA: Diagnosis not present

## 2023-11-11 DIAGNOSIS — N400 Enlarged prostate without lower urinary tract symptoms: Secondary | ICD-10-CM | POA: Diagnosis not present

## 2023-11-11 DIAGNOSIS — H33302 Unspecified retinal break, left eye: Secondary | ICD-10-CM

## 2023-11-11 DIAGNOSIS — R2681 Unsteadiness on feet: Secondary | ICD-10-CM | POA: Diagnosis not present

## 2023-11-11 DIAGNOSIS — N183 Chronic kidney disease, stage 3 unspecified: Secondary | ICD-10-CM | POA: Diagnosis not present

## 2023-11-11 DIAGNOSIS — H353132 Nonexudative age-related macular degeneration, bilateral, intermediate dry stage: Secondary | ICD-10-CM | POA: Diagnosis not present

## 2023-11-11 DIAGNOSIS — H35342 Macular cyst, hole, or pseudohole, left eye: Secondary | ICD-10-CM

## 2023-11-11 DIAGNOSIS — G309 Alzheimer's disease, unspecified: Secondary | ICD-10-CM | POA: Diagnosis not present

## 2023-11-11 DIAGNOSIS — I10 Essential (primary) hypertension: Secondary | ICD-10-CM

## 2023-11-11 DIAGNOSIS — F02B0 Dementia in other diseases classified elsewhere, moderate, without behavioral disturbance, psychotic disturbance, mood disturbance, and anxiety: Secondary | ICD-10-CM | POA: Diagnosis not present

## 2023-11-11 DIAGNOSIS — Z794 Long term (current) use of insulin: Secondary | ICD-10-CM | POA: Diagnosis not present

## 2023-11-11 DIAGNOSIS — E1122 Type 2 diabetes mellitus with diabetic chronic kidney disease: Secondary | ICD-10-CM | POA: Diagnosis not present

## 2023-11-12 ENCOUNTER — Inpatient Hospital Stay: Payer: PPO

## 2023-11-15 ENCOUNTER — Encounter: Admit: 2023-11-15 | Discharge: 2023-11-19 | Disposition: A | Discharge status: Home Health | Source: Intra-hospital

## 2023-11-15 ENCOUNTER — Ambulatory Visit: Admit: 2023-11-15 | Discharge: 2023-11-19 | Disposition: A | Discharge status: Home Health | Source: Intra-hospital

## 2023-11-15 ENCOUNTER — Inpatient Hospital Stay: Admit: 2023-11-15 | Discharge: 2023-11-19 | Disposition: A | Discharge status: Home Health | Source: Intra-hospital

## 2023-11-15 DIAGNOSIS — L539 Erythematous condition, unspecified: Secondary | ICD-10-CM | POA: Diagnosis not present

## 2023-11-15 DIAGNOSIS — S91105A Unspecified open wound of left lesser toe(s) without damage to nail, initial encounter: Secondary | ICD-10-CM | POA: Diagnosis not present

## 2023-11-15 DIAGNOSIS — Z8639 Personal history of other endocrine, nutritional and metabolic disease: Secondary | ICD-10-CM | POA: Diagnosis not present

## 2023-11-15 DIAGNOSIS — I10 Essential (primary) hypertension: Secondary | ICD-10-CM | POA: Diagnosis not present

## 2023-11-15 DIAGNOSIS — E1122 Type 2 diabetes mellitus with diabetic chronic kidney disease: Secondary | ICD-10-CM | POA: Diagnosis not present

## 2023-11-15 DIAGNOSIS — B958 Unspecified staphylococcus as the cause of diseases classified elsewhere: Secondary | ICD-10-CM | POA: Diagnosis not present

## 2023-11-15 DIAGNOSIS — L97524 Non-pressure chronic ulcer of other part of left foot with necrosis of bone: Secondary | ICD-10-CM | POA: Diagnosis not present

## 2023-11-15 DIAGNOSIS — E119 Type 2 diabetes mellitus without complications: Secondary | ICD-10-CM | POA: Diagnosis not present

## 2023-11-15 DIAGNOSIS — E11621 Type 2 diabetes mellitus with foot ulcer: Secondary | ICD-10-CM | POA: Diagnosis not present

## 2023-11-15 DIAGNOSIS — Z794 Long term (current) use of insulin: Secondary | ICD-10-CM | POA: Diagnosis not present

## 2023-11-15 DIAGNOSIS — E1169 Type 2 diabetes mellitus with other specified complication: Secondary | ICD-10-CM | POA: Diagnosis not present

## 2023-11-15 DIAGNOSIS — M86172 Other acute osteomyelitis, left ankle and foot: Secondary | ICD-10-CM | POA: Diagnosis not present

## 2023-11-15 DIAGNOSIS — R030 Elevated blood-pressure reading, without diagnosis of hypertension: Secondary | ICD-10-CM | POA: Diagnosis not present

## 2023-11-15 DIAGNOSIS — L03032 Cellulitis of left toe: Secondary | ICD-10-CM | POA: Diagnosis not present

## 2023-11-15 DIAGNOSIS — Z792 Long term (current) use of antibiotics: Secondary | ICD-10-CM | POA: Diagnosis not present

## 2023-11-15 DIAGNOSIS — Z9189 Other specified personal risk factors, not elsewhere classified: Secondary | ICD-10-CM | POA: Diagnosis not present

## 2023-11-15 DIAGNOSIS — N1831 Chronic kidney disease, stage 3a: Secondary | ICD-10-CM | POA: Diagnosis not present

## 2023-11-15 DIAGNOSIS — E1140 Type 2 diabetes mellitus with diabetic neuropathy, unspecified: Secondary | ICD-10-CM | POA: Diagnosis not present

## 2023-11-15 DIAGNOSIS — M869 Osteomyelitis, unspecified: Secondary | ICD-10-CM | POA: Diagnosis not present

## 2023-11-15 DIAGNOSIS — R600 Localized edema: Secondary | ICD-10-CM | POA: Diagnosis not present

## 2023-11-15 DIAGNOSIS — I129 Hypertensive chronic kidney disease with stage 1 through stage 4 chronic kidney disease, or unspecified chronic kidney disease: Secondary | ICD-10-CM | POA: Diagnosis not present

## 2023-11-15 DIAGNOSIS — L97529 Non-pressure chronic ulcer of other part of left foot with unspecified severity: Secondary | ICD-10-CM | POA: Diagnosis not present

## 2023-11-16 DIAGNOSIS — E119 Type 2 diabetes mellitus without complications: Secondary | ICD-10-CM | POA: Diagnosis not present

## 2023-11-16 DIAGNOSIS — E7800 Pure hypercholesterolemia, unspecified: Secondary | ICD-10-CM | POA: Diagnosis not present

## 2023-11-16 DIAGNOSIS — Z87891 Personal history of nicotine dependence: Secondary | ICD-10-CM | POA: Diagnosis not present

## 2023-11-16 DIAGNOSIS — K219 Gastro-esophageal reflux disease without esophagitis: Secondary | ICD-10-CM | POA: Diagnosis not present

## 2023-11-16 DIAGNOSIS — E1140 Type 2 diabetes mellitus with diabetic neuropathy, unspecified: Secondary | ICD-10-CM | POA: Diagnosis not present

## 2023-11-16 DIAGNOSIS — Z794 Long term (current) use of insulin: Secondary | ICD-10-CM | POA: Diagnosis not present

## 2023-11-16 DIAGNOSIS — Z882 Allergy status to sulfonamides status: Secondary | ICD-10-CM | POA: Diagnosis not present

## 2023-11-16 DIAGNOSIS — E1169 Type 2 diabetes mellitus with other specified complication: Secondary | ICD-10-CM | POA: Diagnosis not present

## 2023-11-16 DIAGNOSIS — S91105A Unspecified open wound of left lesser toe(s) without damage to nail, initial encounter: Secondary | ICD-10-CM | POA: Diagnosis not present

## 2023-11-16 DIAGNOSIS — E1122 Type 2 diabetes mellitus with diabetic chronic kidney disease: Secondary | ICD-10-CM | POA: Diagnosis not present

## 2023-11-16 DIAGNOSIS — L03032 Cellulitis of left toe: Secondary | ICD-10-CM | POA: Diagnosis not present

## 2023-11-16 DIAGNOSIS — F02A0 Dementia in other diseases classified elsewhere, mild, without behavioral disturbance, psychotic disturbance, mood disturbance, and anxiety: Secondary | ICD-10-CM | POA: Diagnosis not present

## 2023-11-16 DIAGNOSIS — M86172 Other acute osteomyelitis, left ankle and foot: Secondary | ICD-10-CM | POA: Diagnosis not present

## 2023-11-16 DIAGNOSIS — G309 Alzheimer's disease, unspecified: Secondary | ICD-10-CM | POA: Diagnosis not present

## 2023-11-16 DIAGNOSIS — D469 Myelodysplastic syndrome, unspecified: Secondary | ICD-10-CM | POA: Diagnosis not present

## 2023-11-16 DIAGNOSIS — I10 Essential (primary) hypertension: Secondary | ICD-10-CM | POA: Diagnosis not present

## 2023-11-16 DIAGNOSIS — D696 Thrombocytopenia, unspecified: Secondary | ICD-10-CM | POA: Diagnosis not present

## 2023-11-16 DIAGNOSIS — L539 Erythematous condition, unspecified: Secondary | ICD-10-CM | POA: Diagnosis not present

## 2023-11-16 DIAGNOSIS — L97529 Non-pressure chronic ulcer of other part of left foot with unspecified severity: Secondary | ICD-10-CM | POA: Diagnosis not present

## 2023-11-16 DIAGNOSIS — Z1152 Encounter for screening for COVID-19: Secondary | ICD-10-CM | POA: Diagnosis not present

## 2023-11-16 DIAGNOSIS — E1142 Type 2 diabetes mellitus with diabetic polyneuropathy: Secondary | ICD-10-CM | POA: Diagnosis not present

## 2023-11-16 DIAGNOSIS — E11621 Type 2 diabetes mellitus with foot ulcer: Secondary | ICD-10-CM | POA: Diagnosis not present

## 2023-11-16 DIAGNOSIS — R7982 Elevated C-reactive protein (CRP): Secondary | ICD-10-CM | POA: Diagnosis not present

## 2023-11-16 DIAGNOSIS — Z7982 Long term (current) use of aspirin: Secondary | ICD-10-CM | POA: Diagnosis not present

## 2023-11-16 DIAGNOSIS — I129 Hypertensive chronic kidney disease with stage 1 through stage 4 chronic kidney disease, or unspecified chronic kidney disease: Secondary | ICD-10-CM | POA: Diagnosis not present

## 2023-11-16 DIAGNOSIS — B9562 Methicillin resistant Staphylococcus aureus infection as the cause of diseases classified elsewhere: Secondary | ICD-10-CM | POA: Diagnosis not present

## 2023-11-16 DIAGNOSIS — N1831 Chronic kidney disease, stage 3a: Secondary | ICD-10-CM | POA: Diagnosis not present

## 2023-11-17 DIAGNOSIS — E1140 Type 2 diabetes mellitus with diabetic neuropathy, unspecified: Secondary | ICD-10-CM | POA: Diagnosis not present

## 2023-11-17 DIAGNOSIS — M86172 Other acute osteomyelitis, left ankle and foot: Secondary | ICD-10-CM | POA: Diagnosis not present

## 2023-11-19 ENCOUNTER — Inpatient Hospital Stay: Payer: PPO

## 2023-11-19 MED ORDER — AMOXICILLIN 875 MG-POTASSIUM CLAVULANATE 125 MG TABLET
ORAL_TABLET | Freq: Two times a day (BID) | ORAL | 0 refills | 13 days | Status: CP
Start: 2023-11-19 — End: 2023-12-02

## 2023-11-19 MED ORDER — LANTUS SOLOSTAR U-100 INSULIN 100 UNIT/ML (3 ML) SUBCUTANEOUS PEN
Freq: Every evening | SUBCUTANEOUS | 0 refills | 187 days | Status: CP
Start: 2023-11-19 — End: 2023-12-19

## 2023-11-19 MED ORDER — INSULIN LISPRO (U-100) 100 UNIT/ML SUBCUTANEOUS PEN
Freq: Three times a day (TID) | SUBCUTANEOUS | 0 refills | 72 days | Status: CP
Start: 2023-11-19 — End: 2023-12-19

## 2023-11-19 MED ORDER — DOXYCYCLINE HYCLATE 100 MG TABLET
ORAL_TABLET | Freq: Two times a day (BID) | ORAL | 0 refills | 13 days | Status: CP
Start: 2023-11-19 — End: 2023-12-02

## 2023-11-20 ENCOUNTER — Ambulatory Visit: Admitting: Podiatry

## 2023-11-20 MED ORDER — TORSEMIDE 20 MG TABLET
ORAL_TABLET | Freq: Every day | ORAL | 0 refills | 30 days | Status: CP
Start: 2023-11-20 — End: 2023-12-20

## 2023-11-21 ENCOUNTER — Ambulatory Visit: Payer: PPO | Admitting: Podiatry

## 2023-11-22 DIAGNOSIS — B9562 Methicillin resistant Staphylococcus aureus infection as the cause of diseases classified elsewhere: Secondary | ICD-10-CM | POA: Diagnosis not present

## 2023-11-22 DIAGNOSIS — I130 Hypertensive heart and chronic kidney disease with heart failure and stage 1 through stage 4 chronic kidney disease, or unspecified chronic kidney disease: Secondary | ICD-10-CM | POA: Diagnosis not present

## 2023-11-22 DIAGNOSIS — Z7982 Long term (current) use of aspirin: Secondary | ICD-10-CM | POA: Diagnosis not present

## 2023-11-22 DIAGNOSIS — D631 Anemia in chronic kidney disease: Secondary | ICD-10-CM | POA: Diagnosis not present

## 2023-11-22 DIAGNOSIS — Z8546 Personal history of malignant neoplasm of prostate: Secondary | ICD-10-CM | POA: Diagnosis not present

## 2023-11-22 DIAGNOSIS — E7800 Pure hypercholesterolemia, unspecified: Secondary | ICD-10-CM | POA: Diagnosis not present

## 2023-11-22 DIAGNOSIS — G309 Alzheimer's disease, unspecified: Secondary | ICD-10-CM | POA: Diagnosis not present

## 2023-11-22 DIAGNOSIS — E11621 Type 2 diabetes mellitus with foot ulcer: Secondary | ICD-10-CM | POA: Diagnosis not present

## 2023-11-22 DIAGNOSIS — L03032 Cellulitis of left toe: Secondary | ICD-10-CM | POA: Diagnosis not present

## 2023-11-22 DIAGNOSIS — D469 Myelodysplastic syndrome, unspecified: Secondary | ICD-10-CM | POA: Diagnosis not present

## 2023-11-22 DIAGNOSIS — Z792 Long term (current) use of antibiotics: Secondary | ICD-10-CM | POA: Diagnosis not present

## 2023-11-22 DIAGNOSIS — Z556 Problems related to health literacy: Secondary | ICD-10-CM | POA: Diagnosis not present

## 2023-11-22 DIAGNOSIS — M86172 Other acute osteomyelitis, left ankle and foot: Secondary | ICD-10-CM | POA: Diagnosis not present

## 2023-11-22 DIAGNOSIS — Z794 Long term (current) use of insulin: Secondary | ICD-10-CM | POA: Diagnosis not present

## 2023-11-22 DIAGNOSIS — E1142 Type 2 diabetes mellitus with diabetic polyneuropathy: Secondary | ICD-10-CM | POA: Diagnosis not present

## 2023-11-22 DIAGNOSIS — K219 Gastro-esophageal reflux disease without esophagitis: Secondary | ICD-10-CM | POA: Diagnosis not present

## 2023-11-22 DIAGNOSIS — F02A0 Dementia in other diseases classified elsewhere, mild, without behavioral disturbance, psychotic disturbance, mood disturbance, and anxiety: Secondary | ICD-10-CM | POA: Diagnosis not present

## 2023-11-22 DIAGNOSIS — E1122 Type 2 diabetes mellitus with diabetic chronic kidney disease: Secondary | ICD-10-CM | POA: Diagnosis not present

## 2023-11-22 DIAGNOSIS — E1169 Type 2 diabetes mellitus with other specified complication: Secondary | ICD-10-CM | POA: Diagnosis not present

## 2023-11-22 DIAGNOSIS — J45909 Unspecified asthma, uncomplicated: Secondary | ICD-10-CM | POA: Diagnosis not present

## 2023-11-22 DIAGNOSIS — L97522 Non-pressure chronic ulcer of other part of left foot with fat layer exposed: Secondary | ICD-10-CM | POA: Diagnosis not present

## 2023-11-22 DIAGNOSIS — Z85828 Personal history of other malignant neoplasm of skin: Secondary | ICD-10-CM | POA: Diagnosis not present

## 2023-11-22 DIAGNOSIS — N1831 Chronic kidney disease, stage 3a: Secondary | ICD-10-CM | POA: Diagnosis not present

## 2023-11-22 DIAGNOSIS — I509 Heart failure, unspecified: Secondary | ICD-10-CM | POA: Diagnosis not present

## 2023-11-25 ENCOUNTER — Ambulatory Visit (INDEPENDENT_AMBULATORY_CARE_PROVIDER_SITE_OTHER): Admitting: Podiatry

## 2023-11-25 ENCOUNTER — Ambulatory Visit: Admitting: Podiatry

## 2023-11-25 DIAGNOSIS — N183 Chronic kidney disease, stage 3 unspecified: Secondary | ICD-10-CM | POA: Diagnosis not present

## 2023-11-25 DIAGNOSIS — G309 Alzheimer's disease, unspecified: Secondary | ICD-10-CM | POA: Diagnosis not present

## 2023-11-25 DIAGNOSIS — Z794 Long term (current) use of insulin: Secondary | ICD-10-CM | POA: Diagnosis not present

## 2023-11-25 DIAGNOSIS — A4902 Methicillin resistant Staphylococcus aureus infection, unspecified site: Secondary | ICD-10-CM | POA: Insufficient documentation

## 2023-11-25 DIAGNOSIS — E1122 Type 2 diabetes mellitus with diabetic chronic kidney disease: Secondary | ICD-10-CM

## 2023-11-25 DIAGNOSIS — F02B0 Dementia in other diseases classified elsewhere, moderate, without behavioral disturbance, psychotic disturbance, mood disturbance, and anxiety: Secondary | ICD-10-CM | POA: Diagnosis not present

## 2023-11-25 DIAGNOSIS — M86172 Other acute osteomyelitis, left ankle and foot: Secondary | ICD-10-CM | POA: Diagnosis not present

## 2023-11-25 DIAGNOSIS — R269 Unspecified abnormalities of gait and mobility: Secondary | ICD-10-CM | POA: Diagnosis not present

## 2023-11-25 DIAGNOSIS — E1140 Type 2 diabetes mellitus with diabetic neuropathy, unspecified: Secondary | ICD-10-CM | POA: Diagnosis not present

## 2023-11-25 DIAGNOSIS — I129 Hypertensive chronic kidney disease with stage 1 through stage 4 chronic kidney disease, or unspecified chronic kidney disease: Secondary | ICD-10-CM | POA: Diagnosis not present

## 2023-11-25 DIAGNOSIS — D469 Myelodysplastic syndrome, unspecified: Secondary | ICD-10-CM | POA: Diagnosis not present

## 2023-11-25 DIAGNOSIS — L97522 Non-pressure chronic ulcer of other part of left foot with fat layer exposed: Secondary | ICD-10-CM

## 2023-11-25 DIAGNOSIS — N1831 Chronic kidney disease, stage 3a: Secondary | ICD-10-CM | POA: Diagnosis not present

## 2023-11-25 NOTE — Progress Notes (Signed)
 Subjective:  Patient ID: Alex Smith, male    DOB: 16-Nov-1934,  MRN: [id number]  Chief Complaint  Patient presents with   Wound Check    Left foot 2nd toe     89 y.o. male presents with the above complaint.  Patient presents with left second digit ulceration that is probing down to bone.  Patient has history at [facility] where he underwent angiogram to improve circulation as well as was recommended amputation of the second toe.  However patient did not want to undergo amputation and recommended IV antibiotics and toe salvage.  He is here today to obtain a second opinion regarding amputation versus continue with this management.  He states he feels better is not causing him any sickness no nausea fever chills vomiting.   Review of Systems: Negative except as noted in the HPI. Denies N/V/F/Ch.  Past Medical History:  Diagnosis Date   Actinic keratosis    Alzheimer's dementia (HCC) 08/18/2021   Anemia    B12 deficiency    Basal cell carcinoma 03/30/2018   left ant nasal ala   Basal cell carcinoma 06/25/2023   R cheek, schedule for [facility]   Basal cell carcinoma of face 01/19/2013   right distal dorsum nose   BPH (benign prostatic hypertrophy)    followed by urology, discharged (Dr. 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 pretibial. 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 EDC 09/07/2020   Squamous cell carcinoma of  skin 04/03/2020   Left cheek. WD SCC   Systolic murmur 05/28/2016   Type 2 diabetes, controlled, with retinopathy (HCC)    No current facility-administered medications for this visit. No current outpatient medications on file.  Facility-Administered Medications Ordered in Other Visits:    0.9 %  sodium chloride infusion, , Intravenous, Continuous, Annetta Killian, PA-C, Last Rate: 50 mL/hr at 12/02/23 1057, New Bag at 12/02/23 1057   acetaminophen (TYLENOL) tablet 1,000 mg, 1,000 mg, Oral, Q6H, Cannon Champion, MD, 1,000 mg at 12/02/23 1154   albuterol (PROVENTIL) (2.5 MG/3ML) 0.083% nebulizer solution 3 mL, 3 mL, Inhalation, Q6H PRN, Cannon Champion, MD   amLODipine (NORVASC) tablet 10 mg, 10 mg, Oral, Daily, Cannon Champion, MD, 10 mg at 12/02/23 1043   aspirin EC tablet 81 mg, 81 mg, Oral, Daily, Cannon Champion, MD, 81 mg at 12/02/23 1045   cyanocobalamin (VITAMIN B12) tablet 1,000 mcg, 1,000 mcg, Oral, Daily, Annetta Killian, PA-C, 1,000 mcg at 12/02/23 1046   docusate sodium (COLACE) capsule 100 mg, 100 mg, Oral, BID, Metzger, Gregory A, MD, 100 mg at 12/02/23 1044   donepezil (ARICEPT) tablet 5 mg, 5 mg, Oral, Daily, Cannon Champion, MD, 5 mg at 12/02/23 1044   famotidine (PEPCID) tablet 20 mg, 20 mg, Oral, Daily, Cannon Champion, MD, 20 mg at 12/02/23  1045   gabapentin (NEURONTIN) capsule 300 mg, 300 mg, Oral, TID, Cannon Champion, MD, 300 mg at 12/02/23 1044   heparin lock flush 100 unit/mL, 500 Units, Intravenous, Once, Corcoran, Melissa C, MD   hydrALAZINE (APRESOLINE) injection 10 mg, 10 mg, Intravenous, Q2H PRN, Cannon Champion, MD   HYDROmorphone (DILAUDID) injection 1 mg, 1 mg, Intravenous, Q2H PRN, Cannon Champion, MD, 1 mg at 12/02/23 0741   insulin aspart (novoLOG) injection 0-15 Units, 0-15 Units, Subcutaneous, TID WC, Cannon Champion, MD, 8 Units at 12/02/23 1153   insulin aspart (novoLOG) injection 0-5 Units, 0-5 Units, Subcutaneous, QHS,  Cannon Champion, MD, 3 Units at 11/30/23 2316   insulin aspart (novoLOG) injection 2 Units, 2 Units, Subcutaneous, TID WC, Annetta Killian, PA-C, 2 Units at 12/02/23 1155   insulin glargine-yfgn (SEMGLEE) injection 8 Units, 8 Units, Subcutaneous, QHS, Johnson, Kelly R, PA-C   lidocaine (LIDODERM) 5 % 2 patch, 2 patch, Transdermal, Q24H, Annetta Killian, PA-C, 2 patch at 12/02/23 1043   losartan (COZAAR) tablet 25 mg, 25 mg, Oral, Daily, Annetta Killian, PA-C, 25 mg at 12/02/23 1044   luspatercept -aamt (REBLOZYL ) subcutaneous injection 100 mg, 100 mg, Subcutaneous, Q21 days, Avonne Boettcher, MD, 100 mg at 01/09/23 1428   melatonin tablet 10 mg, 10 mg, Oral, QHS, Johnson, Kelly R, PA-C, 10 mg at 12/01/23 2148   methocarbamol (ROBAXIN) tablet 1,000 mg, 1,000 mg, Oral, Q8H, 1,000 mg at 12/02/23 1154 **OR** [DISCONTINUED] methocarbamol (ROBAXIN) injection 500 mg, 500 mg, Intravenous, Q8H, Metzger, Willeen Harold, MD   metoprolol tartrate (LOPRESSOR) injection 5 mg, 5 mg, Intravenous, Q6H PRN, Davonna Estes Willeen Harold, MD   multivitamin with minerals tablet 1 tablet, 1 tablet, Oral, Daily, Annetta Killian, PA-C, 1 tablet at 12/02/23 1044   ondansetron (ZOFRAN-ODT) disintegrating tablet 4 mg, 4 mg, Oral, Q6H PRN **OR** ondansetron (ZOFRAN) injection 4 mg, 4 mg, Intravenous, Q6H PRN, Cannon Champion, MD   Oral care mouth rinse, 15 mL, Mouth Rinse, PRN, Stechschulte, Avon Boers, MD   oxyCODONE (Oxy IR/ROXICODONE) immediate release tablet 2.5-5 mg, 2.5-5 mg, Oral, Q4H PRN, Annetta Killian, PA-C, 5 mg at 12/02/23 0305   pantoprazole (PROTONIX) EC tablet 40 mg, 40 mg, Oral, BID, Johnson, Kelly R, PA-C, 40 mg at 12/02/23 1045   polyethylene glycol (MIRALAX / GLYCOLAX) packet 17 g, 17 g, Oral, Daily PRN, Cannon Champion, MD   pravastatin (PRAVACHOL) tablet 20 mg, 20 mg, Oral, Daily, Cannon Champion, MD, 20 mg at 12/02/23 1045   sertraline (ZOLOFT) tablet 25 mg, 25 mg, Oral, Daily, Cannon Champion, MD, 25 mg at  12/02/23 1044   sodium chloride 0.9 % injection 10 mL, 10 mL, Intravenous, PRN, Beverely Buba, Melissa C, MD, 10 mL at 03/03/15 0903   sodium chloride 0.9 % injection 10 mL, 10 mL, Intracatheter, PRN, Beverely Buba, Melissa C, MD, 10 mL at 03/10/15 1410   sodium chloride flush (NS) 0.9 % injection 10 mL, 10 mL, Intravenous, PRN, Beverely Buba, Melissa C, MD, 10 mL at 07/23/18 1517   tamsulosin (FLOMAX) capsule 0.4 mg, 0.4 mg, Oral, QHS, Johnson, Kelly R, PA-C, 0.4 mg at 12/01/23 2148   torsemide (DEMADEX) tablet 20 mg, 20 mg, Oral, Daily, Johnson, Kelly R, PA-C, 20 mg at 12/02/23 1045   vitamin E capsule 400 Units, 400 Units, Oral, Daily, Annetta Killian, PA-C, 400 Units at 12/02/23 1101  Social History   Tobacco Use  Smoking Status Former   Current packs/day: 0.00  Average packs/day: 2.0 packs/day for 25.0 years (50.0 ttl pk-yrs)   Types: Cigarettes   Start date: 02/07/1944   Quit date: 02/06/1969   Years since quitting: 54.8  Smokeless Tobacco Never  Tobacco Comments   07/07/2012 "stopped smoking ~ 40 yr ago; smoked 20-45yr"    Allergies  Allergen Reactions   Rituxan [Rituximab] Rash and Other (See Comments)    Chest tightness    Blood-Group Specific Substance Other (See Comments)    Had a post transfusion reaction of red blood cells; NOW REQUIRES WASHED BLOOD CELLS   Primaxin Iv [Imipenem-Cilastatin] Other (See Comments)    Unknown reaction   Primaxin [Imipenem] Other (See Comments)    Unknown reaction Tolerates cephalosporins   Vfend [Voriconazole] Other (See Comments)    Unknown reaction   Sulfa Antibiotics Itching and Rash   Sulfacetamide Sodium Itching and Rash   Objective:  There were no vitals filed for this visit. There is no height or weight on file to calculate BMI. Constitutional Well developed. Well nourished.  Vascular Dorsalis pedis pulses palpable bilaterally. Posterior tibial pulses palpable bilaterally. Capillary refill normal to all digits.  No cyanosis or  clubbing noted. Pedal hair growth normal.  Neurologic Normal speech. Oriented to person, place, and time. Epicritic sensation to light touch grossly present bilaterally.  Dermatologic Nails well groomed and normal in appearance. No open wounds. No skin lesions.  Orthopedic: Left second digit ulceration probing down to deep bone.  No purulent drainage noted no erythema noted.  Some edema noted around the second toe.  No other signs of infection noted   Radiographs: None Assessment:   1. Toe ulcer, left, with fat layer exposed (HCC)   2. Type 2 diabetes mellitus with stage 3 chronic kidney disease, with long-term current use of insulin, unspecified whether stage 3a or 3b CKD (HCC)    Plan:  Patient was evaluated and treated and all questions answered.  Left second digit ulceration fat layer exposed probing down to bone - All questions and concerns were discussed with the patient in extensive detail.  Given the findings of probe to bone I believe patient will ultimately benefit from amputation site however there is no other clinical signs of infection warrant indicated at this time to return to the hospital setting.  He will continue oral antibiotics per infectious disease.  I ultimately recommended that he will benefit from a second digit amputation.  He would like to think about it.  Will continue local wound care with Betadine wet-to-dry dressing.  He states understanding.  No follow-ups on file.   Left sdecond diigt ulcer fat layer expoed with probing to bone imprving went to [facility] recommended toe amputation but didn't undergo it.

## 2023-11-26 ENCOUNTER — Inpatient Hospital Stay: Payer: PPO

## 2023-11-26 DIAGNOSIS — S81001A Unspecified open wound, right knee, initial encounter: Secondary | ICD-10-CM | POA: Diagnosis not present

## 2023-11-26 DIAGNOSIS — M86172 Other acute osteomyelitis, left ankle and foot: Secondary | ICD-10-CM | POA: Diagnosis not present

## 2023-11-27 ENCOUNTER — Telehealth: Payer: Self-pay

## 2023-11-27 NOTE — Telephone Encounter (Signed)
 Patient's daughter, Clydie Braun called and left a message - her question is about walking - patient did 5 laps around the [facility] yesterday -she is concerned that he will make his toe worse. Please advise if he should be walking or not

## 2023-11-28 NOTE — Telephone Encounter (Signed)
 I called the daughter (DPR) and gave her dr Allena Katz recommendations and she voices understanding. She did ask if the chart had been updated from Dr. Allena Katz for her brothers to view and I told her it took a little time for the chart to be updated and she voices understanding. Nothing further needed.

## 2023-11-30 ENCOUNTER — Emergency Department
Admission: EM | Admit: 2023-11-30 | Discharge: 2023-11-30 | Disposition: A | Discharge status: Home / Self Care | Attending: Emergency Medicine | Admitting: Emergency Medicine

## 2023-11-30 ENCOUNTER — Emergency Department

## 2023-11-30 ENCOUNTER — Encounter (HOSPITAL_COMMUNITY): Payer: Self-pay

## 2023-11-30 ENCOUNTER — Inpatient Hospital Stay (HOSPITAL_COMMUNITY)
Admission: EM | Admit: 2023-11-30 | Discharge: 2023-12-09 | DRG: 206 | Disposition: A | Discharge status: Skilled Nursing Facility | Source: Other Acute Inpatient Hospital | Attending: General Surgery | Admitting: General Surgery

## 2023-11-30 ENCOUNTER — Other Ambulatory Visit: Payer: Self-pay

## 2023-11-30 DIAGNOSIS — M6259 Muscle wasting and atrophy, not elsewhere classified, multiple sites: Secondary | ICD-10-CM | POA: Diagnosis not present

## 2023-11-30 DIAGNOSIS — W1840XA Slipping, tripping and stumbling without falling, unspecified, initial encounter: Secondary | ICD-10-CM | POA: Diagnosis not present

## 2023-11-30 DIAGNOSIS — N189 Chronic kidney disease, unspecified: Secondary | ICD-10-CM | POA: Insufficient documentation

## 2023-11-30 DIAGNOSIS — E569 Vitamin deficiency, unspecified: Secondary | ICD-10-CM | POA: Diagnosis not present

## 2023-11-30 DIAGNOSIS — K219 Gastro-esophageal reflux disease without esophagitis: Secondary | ICD-10-CM | POA: Diagnosis not present

## 2023-11-30 DIAGNOSIS — S2242XA Multiple fractures of ribs, left side, initial encounter for closed fracture: Secondary | ICD-10-CM | POA: Diagnosis not present

## 2023-11-30 DIAGNOSIS — Z8249 Family history of ischemic heart disease and other diseases of the circulatory system: Secondary | ICD-10-CM

## 2023-11-30 DIAGNOSIS — L97522 Non-pressure chronic ulcer of other part of left foot with fat layer exposed: Secondary | ICD-10-CM | POA: Diagnosis not present

## 2023-11-30 DIAGNOSIS — F0390 Unspecified dementia without behavioral disturbance: Secondary | ICD-10-CM | POA: Diagnosis not present

## 2023-11-30 DIAGNOSIS — N179 Acute kidney failure, unspecified: Secondary | ICD-10-CM | POA: Diagnosis present

## 2023-11-30 DIAGNOSIS — F0280 Dementia in other diseases classified elsewhere without behavioral disturbance: Secondary | ICD-10-CM | POA: Diagnosis present

## 2023-11-30 DIAGNOSIS — J90 Pleural effusion, not elsewhere classified: Secondary | ICD-10-CM | POA: Diagnosis present

## 2023-11-30 DIAGNOSIS — N1831 Chronic kidney disease, stage 3a: Secondary | ICD-10-CM | POA: Diagnosis not present

## 2023-11-30 DIAGNOSIS — Z86008 Personal history of in-situ neoplasm of other site: Secondary | ICD-10-CM | POA: Diagnosis not present

## 2023-11-30 DIAGNOSIS — D696 Thrombocytopenia, unspecified: Secondary | ICD-10-CM | POA: Diagnosis not present

## 2023-11-30 DIAGNOSIS — N400 Enlarged prostate without lower urinary tract symptoms: Secondary | ICD-10-CM | POA: Diagnosis present

## 2023-11-30 DIAGNOSIS — F5101 Primary insomnia: Secondary | ICD-10-CM | POA: Diagnosis not present

## 2023-11-30 DIAGNOSIS — J9811 Atelectasis: Secondary | ICD-10-CM | POA: Diagnosis not present

## 2023-11-30 DIAGNOSIS — R918 Other nonspecific abnormal finding of lung field: Secondary | ICD-10-CM | POA: Diagnosis not present

## 2023-11-30 DIAGNOSIS — S20222A Contusion of left back wall of thorax, initial encounter: Secondary | ICD-10-CM | POA: Diagnosis not present

## 2023-11-30 DIAGNOSIS — R2681 Unsteadiness on feet: Secondary | ICD-10-CM | POA: Diagnosis not present

## 2023-11-30 DIAGNOSIS — F32A Depression, unspecified: Secondary | ICD-10-CM | POA: Diagnosis not present

## 2023-11-30 DIAGNOSIS — Z860100 Personal history of colon polyps, unspecified: Secondary | ICD-10-CM

## 2023-11-30 DIAGNOSIS — Z888 Allergy status to other drugs, medicaments and biological substances status: Secondary | ICD-10-CM | POA: Diagnosis not present

## 2023-11-30 DIAGNOSIS — C9000 Multiple myeloma not having achieved remission: Secondary | ICD-10-CM | POA: Diagnosis not present

## 2023-11-30 DIAGNOSIS — Z803 Family history of malignant neoplasm of breast: Secondary | ICD-10-CM

## 2023-11-30 DIAGNOSIS — S299XXA Unspecified injury of thorax, initial encounter: Secondary | ICD-10-CM | POA: Diagnosis present

## 2023-11-30 DIAGNOSIS — I129 Hypertensive chronic kidney disease with stage 1 through stage 4 chronic kidney disease, or unspecified chronic kidney disease: Secondary | ICD-10-CM | POA: Diagnosis present

## 2023-11-30 DIAGNOSIS — Z882 Allergy status to sulfonamides status: Secondary | ICD-10-CM | POA: Diagnosis not present

## 2023-11-30 DIAGNOSIS — N281 Cyst of kidney, acquired: Secondary | ICD-10-CM | POA: Diagnosis not present

## 2023-11-30 DIAGNOSIS — S225XXA Flail chest, initial encounter for closed fracture: Secondary | ICD-10-CM | POA: Insufficient documentation

## 2023-11-30 DIAGNOSIS — F05 Delirium due to known physiological condition: Secondary | ICD-10-CM | POA: Diagnosis not present

## 2023-11-30 DIAGNOSIS — S2769XA Other injury of pleura, initial encounter: Secondary | ICD-10-CM | POA: Diagnosis not present

## 2023-11-30 DIAGNOSIS — W19XXXD Unspecified fall, subsequent encounter: Secondary | ICD-10-CM | POA: Diagnosis not present

## 2023-11-30 DIAGNOSIS — E1140 Type 2 diabetes mellitus with diabetic neuropathy, unspecified: Secondary | ICD-10-CM | POA: Diagnosis not present

## 2023-11-30 DIAGNOSIS — Z7401 Bed confinement status: Secondary | ICD-10-CM | POA: Diagnosis not present

## 2023-11-30 DIAGNOSIS — G309 Alzheimer's disease, unspecified: Secondary | ICD-10-CM | POA: Diagnosis not present

## 2023-11-30 DIAGNOSIS — E11319 Type 2 diabetes mellitus with unspecified diabetic retinopathy without macular edema: Secondary | ICD-10-CM | POA: Diagnosis present

## 2023-11-30 DIAGNOSIS — Z7982 Long term (current) use of aspirin: Secondary | ICD-10-CM

## 2023-11-30 DIAGNOSIS — Z79899 Other long term (current) drug therapy: Secondary | ICD-10-CM | POA: Diagnosis not present

## 2023-11-30 DIAGNOSIS — Z85828 Personal history of other malignant neoplasm of skin: Secondary | ICD-10-CM

## 2023-11-30 DIAGNOSIS — T1490XA Injury, unspecified, initial encounter: Secondary | ICD-10-CM | POA: Diagnosis not present

## 2023-11-30 DIAGNOSIS — L089 Local infection of the skin and subcutaneous tissue, unspecified: Secondary | ICD-10-CM | POA: Diagnosis present

## 2023-11-30 DIAGNOSIS — D649 Anemia, unspecified: Secondary | ICD-10-CM | POA: Diagnosis not present

## 2023-11-30 DIAGNOSIS — N183 Chronic kidney disease, stage 3 unspecified: Secondary | ICD-10-CM | POA: Diagnosis not present

## 2023-11-30 DIAGNOSIS — Z833 Family history of diabetes mellitus: Secondary | ICD-10-CM

## 2023-11-30 DIAGNOSIS — S2242XD Multiple fractures of ribs, left side, subsequent encounter for fracture with routine healing: Secondary | ICD-10-CM | POA: Diagnosis not present

## 2023-11-30 DIAGNOSIS — E1122 Type 2 diabetes mellitus with diabetic chronic kidney disease: Secondary | ICD-10-CM | POA: Diagnosis not present

## 2023-11-30 DIAGNOSIS — Z794 Long term (current) use of insulin: Secondary | ICD-10-CM

## 2023-11-30 DIAGNOSIS — I700 Atherosclerosis of aorta: Secondary | ICD-10-CM | POA: Diagnosis not present

## 2023-11-30 DIAGNOSIS — W1830XA Fall on same level, unspecified, initial encounter: Secondary | ICD-10-CM | POA: Diagnosis present

## 2023-11-30 DIAGNOSIS — E785 Hyperlipidemia, unspecified: Secondary | ICD-10-CM | POA: Diagnosis not present

## 2023-11-30 DIAGNOSIS — Z741 Need for assistance with personal care: Secondary | ICD-10-CM | POA: Diagnosis not present

## 2023-11-30 DIAGNOSIS — Z87891 Personal history of nicotine dependence: Secondary | ICD-10-CM

## 2023-11-30 DIAGNOSIS — Z856 Personal history of leukemia: Secondary | ICD-10-CM

## 2023-11-30 DIAGNOSIS — R531 Weakness: Secondary | ICD-10-CM | POA: Diagnosis not present

## 2023-11-30 DIAGNOSIS — R0781 Pleurodynia: Secondary | ICD-10-CM | POA: Diagnosis not present

## 2023-11-30 DIAGNOSIS — W19XXXA Unspecified fall, initial encounter: Secondary | ICD-10-CM | POA: Diagnosis not present

## 2023-11-30 DIAGNOSIS — Z820 Family history of epilepsy and other diseases of the nervous system: Secondary | ICD-10-CM

## 2023-11-30 DIAGNOSIS — I10 Essential (primary) hypertension: Secondary | ICD-10-CM | POA: Diagnosis not present

## 2023-11-30 LAB — CBC
HCT: 26.7 % — ABNORMAL LOW (ref 39.0–52.0)
HCT: 26.9 % — ABNORMAL LOW (ref 39.0–52.0)
Hemoglobin: 8.7 g/dL — ABNORMAL LOW (ref 13.0–17.0)
Hemoglobin: 8.7 g/dL — ABNORMAL LOW (ref 13.0–17.0)
MCH: 34.4 pg — ABNORMAL HIGH (ref 26.0–34.0)
MCH: 34.3 pg — ABNORMAL HIGH (ref 26.0–34.0)
MCHC: 32.6 g/dL (ref 30.0–36.0)
MCHC: 32.3 g/dL (ref 30.0–36.0)
MCV: 105.5 fL — ABNORMAL HIGH (ref 80.0–100.0)
MCV: 105.9 fL — ABNORMAL HIGH (ref 80.0–100.0)
Platelets: 99 10*3/uL — ABNORMAL LOW (ref 150–400)
Platelets: 91 10*3/uL — ABNORMAL LOW (ref 150–400)
RBC: 2.53 MIL/uL — ABNORMAL LOW (ref 4.22–5.81)
RBC: 2.54 MIL/uL — ABNORMAL LOW (ref 4.22–5.81)
RDW: 16.8 % — ABNORMAL HIGH (ref 11.5–15.5)
RDW: 17 % — ABNORMAL HIGH (ref 11.5–15.5)
WBC: 5.4 10*3/uL (ref 4.0–10.5)
WBC: 5.5 10*3/uL (ref 4.0–10.5)
nRBC: 0 % (ref 0.0–0.2)
nRBC: 0.4 % — ABNORMAL HIGH (ref 0.0–0.2)

## 2023-11-30 LAB — BASIC METABOLIC PANEL WITH GFR
Anion gap: 11 (ref 5–15)
BUN: 34 mg/dL — ABNORMAL HIGH (ref 8–23)
CO2: 23 mmol/L (ref 22–32)
Calcium: 8.8 mg/dL — ABNORMAL LOW (ref 8.9–10.3)
Chloride: 103 mmol/L (ref 98–111)
Creatinine, Ser: 1.65 mg/dL — ABNORMAL HIGH (ref 0.61–1.24)
GFR, Estimated: 40 mL/min — ABNORMAL LOW (ref >60)
Glucose, Bld: 309 mg/dL — ABNORMAL HIGH (ref 70–99)
Potassium: 4.9 mmol/L (ref 3.5–5.1)
Sodium: 137 mmol/L (ref 135–145)

## 2023-11-30 LAB — CREATININE, SERUM
Creatinine, Ser: 1.75 mg/dL — ABNORMAL HIGH (ref 0.61–1.24)
GFR, Estimated: 37 mL/min — ABNORMAL LOW (ref >60)

## 2023-11-30 LAB — GLUCOSE, CAPILLARY: Glucose-Capillary: 267 mg/dL — ABNORMAL HIGH (ref 70–99)

## 2023-11-30 LAB — CBG MONITORING, ED: Glucose-Capillary: 257 mg/dL — ABNORMAL HIGH (ref 70–99)

## 2023-11-30 MED ORDER — METOPROLOL TARTRATE 5 MG/5ML IV SOLN: 5.0000 mg | Freq: Four times a day (QID) | INTRAVENOUS | Status: DC | PRN

## 2023-11-30 MED ORDER — POLYETHYLENE GLYCOL 3350 17 G PO PACK: 17.0000 g | PACK | Freq: Every day | ORAL | Status: DC | PRN

## 2023-11-30 MED ORDER — DOCUSATE SODIUM 100 MG PO CAPS
100.0000 mg | ORAL_CAPSULE | Freq: Two times a day (BID) | ORAL | Status: DC
  Administered 2023-11-30 – 2023-12-09 (×18): 100 mg via ORAL
  Filled 2023-11-30 (×18): qty 1

## 2023-11-30 MED ORDER — HYDROMORPHONE HCL 1 MG/ML IJ SOLN
1.0000 mg | INTRAMUSCULAR | Status: DC | PRN
  Administered 2023-12-01 – 2023-12-02 (×4): 1 mg via INTRAVENOUS
  Filled 2023-11-30 (×5): qty 1

## 2023-11-30 MED ORDER — ALBUTEROL SULFATE (2.5 MG/3ML) 0.083% IN NEBU: 3.0000 mL | INHALATION_SOLUTION | Freq: Four times a day (QID) | RESPIRATORY_TRACT | Status: DC | PRN

## 2023-11-30 MED ORDER — MORPHINE SULFATE (PF) 2 MG/ML IV SOLN
2.0000 mg | Freq: Once | INTRAVENOUS | Status: AC
  Administered 2023-11-30: 2 mg via INTRAVENOUS
  Filled 2023-11-30: qty 1

## 2023-11-30 MED ORDER — INSULIN ASPART 100 UNIT/ML IJ SOLN
0.0000 [IU] | Freq: Every day | INTRAMUSCULAR | Status: DC
  Administered 2023-11-30 – 2023-12-02 (×2): 3 [IU] via SUBCUTANEOUS
  Administered 2023-12-06: 2 [IU] via SUBCUTANEOUS

## 2023-11-30 MED ORDER — HYDRALAZINE HCL 20 MG/ML IJ SOLN
10.0000 mg | INTRAMUSCULAR | Status: DC | PRN
  Filled 2023-11-30: qty 1

## 2023-11-30 MED ORDER — OXYCODONE HCL 5 MG PO TABS
5.0000 mg | ORAL_TABLET | ORAL | Status: DC | PRN
  Administered 2023-11-30: 5 mg via ORAL
  Filled 2023-11-30: qty 1

## 2023-11-30 MED ORDER — IBUPROFEN 200 MG PO TABS
600.0000 mg | ORAL_TABLET | Freq: Four times a day (QID) | ORAL | Status: DC
  Administered 2023-11-30 – 2023-12-02 (×6): 600 mg via ORAL
  Filled 2023-11-30 (×6): qty 3

## 2023-11-30 MED ORDER — FAMOTIDINE 20 MG PO TABS
20.0000 mg | ORAL_TABLET | Freq: Every day | ORAL | Status: DC
  Administered 2023-11-30 – 2023-12-09 (×10): 20 mg via ORAL
  Filled 2023-11-30 (×10): qty 1

## 2023-11-30 MED ORDER — INSULIN ASPART 100 UNIT/ML IJ SOLN
0.0000 [IU] | Freq: Three times a day (TID) | INTRAMUSCULAR | Status: DC
  Administered 2023-12-01: 15 [IU] via SUBCUTANEOUS
  Administered 2023-12-01: 3 [IU] via SUBCUTANEOUS
  Administered 2023-12-01: 8 [IU] via SUBCUTANEOUS
  Administered 2023-12-02 (×2): 5 [IU] via SUBCUTANEOUS
  Administered 2023-12-02: 8 [IU] via SUBCUTANEOUS
  Administered 2023-12-03 (×2): 11 [IU] via SUBCUTANEOUS
  Administered 2023-12-03: 2 [IU] via SUBCUTANEOUS
  Administered 2023-12-04 (×3): 3 [IU] via SUBCUTANEOUS
  Administered 2023-12-05: 5 [IU] via SUBCUTANEOUS
  Administered 2023-12-05: 8 [IU] via SUBCUTANEOUS
  Administered 2023-12-05 – 2023-12-07 (×6): 5 [IU] via SUBCUTANEOUS
  Administered 2023-12-07 – 2023-12-08 (×2): 3 [IU] via SUBCUTANEOUS
  Administered 2023-12-08: 8 [IU] via SUBCUTANEOUS
  Administered 2023-12-08: 2 [IU] via SUBCUTANEOUS
  Administered 2023-12-09 (×2): 3 [IU] via SUBCUTANEOUS

## 2023-11-30 MED ORDER — ONDANSETRON 4 MG PO TBDP: 4.0000 mg | ORAL_TABLET | Freq: Four times a day (QID) | ORAL | Status: DC | PRN

## 2023-11-30 MED ORDER — METHOCARBAMOL 500 MG PO TABS
500.0000 mg | ORAL_TABLET | Freq: Three times a day (TID) | ORAL | Status: DC
  Administered 2023-11-30 – 2023-12-02 (×4): 500 mg via ORAL
  Filled 2023-11-30 (×4): qty 1

## 2023-11-30 MED ORDER — METHOCARBAMOL 1000 MG/10ML IJ SOLN
500.0000 mg | Freq: Three times a day (TID) | INTRAMUSCULAR | Status: DC
  Filled 2023-11-30: qty 10

## 2023-11-30 MED ORDER — AMLODIPINE BESYLATE 10 MG PO TABS
10.0000 mg | ORAL_TABLET | Freq: Every day | ORAL | Status: DC
Start: 2023-11-30 — End: 2023-12-09
  Administered 2023-11-30 – 2023-12-09 (×10): 10 mg via ORAL
  Filled 2023-11-30 (×10): qty 1

## 2023-11-30 MED ORDER — PRAVASTATIN SODIUM 40 MG PO TABS
20.0000 mg | ORAL_TABLET | Freq: Every day | ORAL | Status: DC
  Administered 2023-11-30 – 2023-12-09 (×10): 20 mg via ORAL
  Filled 2023-11-30 (×10): qty 1

## 2023-11-30 MED ORDER — ASPIRIN 81 MG PO TBEC
81.0000 mg | DELAYED_RELEASE_TABLET | Freq: Every day | ORAL | Status: DC
  Administered 2023-11-30 – 2023-12-09 (×10): 81 mg via ORAL
  Filled 2023-11-30 (×10): qty 1

## 2023-11-30 MED ORDER — ENOXAPARIN SODIUM 30 MG/0.3ML IJ SOSY
30.0000 mg | PREFILLED_SYRINGE | Freq: Two times a day (BID) | INTRAMUSCULAR | Status: DC
  Administered 2023-12-01: 30 mg via SUBCUTANEOUS
  Filled 2023-11-30: qty 0.3

## 2023-11-30 MED ORDER — SERTRALINE HCL 25 MG PO TABS
25.0000 mg | ORAL_TABLET | Freq: Every day | ORAL | Status: DC
  Administered 2023-11-30 – 2023-12-09 (×10): 25 mg via ORAL
  Filled 2023-11-30 (×10): qty 1

## 2023-11-30 MED ORDER — GABAPENTIN 300 MG PO CAPS
300.0000 mg | ORAL_CAPSULE | Freq: Three times a day (TID) | ORAL | Status: DC
  Administered 2023-11-30 – 2023-12-03 (×8): 300 mg via ORAL
  Filled 2023-11-30 (×8): qty 1

## 2023-11-30 MED ORDER — ACETAMINOPHEN 500 MG PO TABS
1000.0000 mg | ORAL_TABLET | Freq: Four times a day (QID) | ORAL | Status: DC
  Administered 2023-11-30 – 2023-12-09 (×29): 1000 mg via ORAL
  Filled 2023-11-30 (×34): qty 2

## 2023-11-30 MED ORDER — ONDANSETRON HCL 4 MG/2ML IJ SOLN
4.0000 mg | Freq: Once | INTRAMUSCULAR | Status: AC
  Administered 2023-11-30: 4 mg via INTRAVENOUS
  Filled 2023-11-30: qty 2

## 2023-11-30 MED ORDER — ONDANSETRON HCL 4 MG/2ML IJ SOLN: 4.0000 mg | Freq: Four times a day (QID) | INTRAMUSCULAR | Status: DC | PRN

## 2023-11-30 MED ORDER — DONEPEZIL HCL 5 MG PO TABS
5.0000 mg | ORAL_TABLET | Freq: Every day | ORAL | Status: DC
  Administered 2023-11-30 – 2023-12-09 (×10): 5 mg via ORAL
  Filled 2023-11-30 (×10): qty 1

## 2023-11-30 NOTE — ED Notes (Signed)
 Male purwick placed on pt

## 2023-11-30 NOTE — H&P (Signed)
 Admitting Physician: Alex Smith  Service: Trauma Surgery  CC: Fall  Subjective   Mechanism of Injury: Alex Smith is an 89 y.o. male who presented as a trauma transfer after a fall.  Past Medical History:  Diagnosis Date   Actinic keratosis    Alzheimer's dementia (HCC) 08/18/2021   Anemia    B12 deficiency    Basal cell carcinoma 03/30/2018   left ant nasal ala   Basal cell carcinoma 06/25/2023   R cheek, schedule for [facility]   Basal cell carcinoma of face 01/19/2013   right distal dorsum nose   BPH (benign prostatic hypertrophy)    followed by urology, discharged (Dr. 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 pretibial. 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 EDC 09/07/2020   Squamous cell carcinoma of skin 04/03/2020   Left cheek. WD SCC   Systolic murmur 05/28/2016   Type 2 diabetes, controlled, with retinopathy (HCC)     Past Surgical History:  Procedure Laterality Date   25 GAUGE PARS PLANA VITRECTOMY WITH 20 GAUGE MVR PORT FOR MACULAR HOLE  07/07/2012   Procedure: 25 GAUGE PARS PLANA VITRECTOMY WITH 20 GAUGE MVR PORT FOR MACULAR HOLE;  Surgeon: Rexene Catching, MD;  Location: [facility] OR;  Service: Ophthalmology;  Laterality: Left;   BONE MARROW BIOPSY  2016   CARDIOVASCULAR STRESS TEST  2013   treadmill - no evidence ischemia, EF 61%   CATARACT EXTRACTION W/ INTRAOCULAR LENS  IMPLANT, BILATERAL  ~  2010   COLONOSCOPY  2014   Elliot WNL no rpt needed, h/o polyps   EYE SURGERY Left 06/2012   laser surgery   GAS INSERTION  07/07/2012   Procedure: INSERTION OF GAS;  Surgeon: Rexene Catching, MD;  Location: [facility] OR;  Service: Ophthalmology;  Laterality: Left;  C3F8   PERIPHERAL VASCULAR CATHETERIZATION N/A 02/23/2015   Procedure: Melville Stade Cath Insertion;  Surgeon: Celso College, MD;  Location: [facility] INVASIVE CV LAB;  Service: Cardiovascular;  Laterality: N/A;   PORTA CATH REMOVAL N/A 02/16/2020   Procedure: PORTA CATH REMOVAL;  Surgeon: Celso College, MD;  Location: [facility] INVASIVE CV LAB;  Service: Cardiovascular;  Laterality: N/A;   SERUM PATCH  07/07/2012   Procedure: SERUM PATCH;  Surgeon: Rexene Catching, MD;  Location: [facility] OR;  Service: Ophthalmology;  Laterality: Left;   SKIN CANCER EXCISION     "all over my face" (07/07/2012)    Family History  Problem Relation Age of Onset   Dementia Mother    Heart failure Father 21   Cancer Sister        breast   Diabetes Paternal Uncle    Diabetes Paternal Aunt    CAD Brother 62       MI   Stroke Neg Hx     Social:  reports that  he quit smoking about 54 years ago. His smoking use included cigarettes. He started smoking about 79 years ago. He has a 50 pack-year smoking history. He has never used smokeless tobacco. He reports that he does not currently use alcohol. He reports that he does not use drugs.  Allergies:  Allergies  Allergen Reactions   Rituximab Rash    Chest tightness Chest tightness   Blood-Group Specific Substance Other (See Comments)    Had a post transfusion reaction of red blood cells; NOW REQUIRES WASHED BLOOD CELLS Had a post transfusion reaction of red blood cells; NOW REQUIRES WASHED BLOOD CELLS   Primaxin [Imipenem] Other (See Comments)    Possible allergy Tolerates cephalosporins   Voriconazole Other (See Comments)   Sulfa Antibiotics Itching and Rash   Sulfacetamide Sodium Itching and Rash    Medications: Current  Outpatient Medications  Medication Instructions   acetaminophen (TYLENOL) 1,000 mg, Oral, 2 times daily   albuterol (VENTOLIN HFA) 108 (90 Base) MCG/ACT inhaler 2 puffs, Inhalation, Every 6 hours PRN   alum & mag hydroxide-simeth (MAALOX/MYLANTA) 200-200-20 MG/5ML suspension 30 mLs, Oral, Every 4 hours PRN   amLODipine (NORVASC) 10 mg, Oral, Daily   aspirin EC 81 mg, Daily   cyanocobalamin (VITAMIN B12) 1,000 mcg, Oral, Daily   donepezil (ARICEPT ODT) 5 mg, Daily   famotidine (PEPCID) 20 mg, Daily   insulin glargine (LANTUS) 14 Units, Subcutaneous, Daily at bedtime   insulin lispro (HUMALOG) 2 Units, Subcutaneous, 3 times daily before meals, Per sliding scale   loperamide (IMODIUM A-D) 2 mg, 4 times daily PRN   losartan (COZAAR) 25 mg, Oral, Daily   magnesium hydroxide (MILK OF MAGNESIA) 400 MG/5ML suspension 5 mLs, Daily PRN   melatonin 10 mg, Daily at bedtime   Multiple Vitamin (MULTIVITAMIN WITH MINERALS) TABS tablet 1 tablet, Oral, Daily   pantoprazole (PROTONIX) 40 mg, Oral, 2 times daily   pravastatin (PRAVACHOL) 20 mg, Daily   sertraline (ZOLOFT) 25 mg, Daily   tamsulosin (FLOMAX) 0.4 MG CAPS capsule TAKE 1 CAPSULE BY MOUTH EVERY DAY   torsemide (DEMADEX) 20 mg, Daily   triamcinolone cream (KENALOG) 0.1 % 1 Application, Topical, 2 times daily, Apply liberally to skin bid to bilateral lower legs   vitamin E 400 Units, Daily    Objective   Primary Survey: There were no vitals taken for this visit. Airway: Patent, protecting airway Breathing: Bilateral breath sounds, breathing spontaneously Circulation: Stable, Palpable peripheral pulses Disability: Moving all extremities,   GCS Eyes: 4 - Eyes open spontaneously  GCS Verbal: 5 - Oriented  GCS Motor: 6 - Obeys commands for movement  GCS 15 Environment/Exposure: Warm, dry  Secondary Survey: Head: Normocephalic, atraumatic Neck: Full range of motion without pain, no midline tenderness Chest:  Left chest  tenderness Abdomen: Soft, non-tender, non-distended Upper Extremities: Strength and sensation intact, palpable peripheral pulses Lower extremities: Strength and sensation intact, palpable peripheral pulses Back: No step offs or deformities, atraumatic Rectal: Deferred Psych: Normal mood and affect   Results for orders placed or performed during the hospital encounter of 11/30/23 (from the past 24 hours)  CBC     Status: Abnormal   Collection Time: 11/30/23 12:32 PM  Result Value Ref Range   WBC 5.4 4.0 - 10.5 K/uL   RBC 2.53 (L) 4.22 - 5.81 MIL/uL   Hemoglobin 8.7 (L) 13.0 - 17.0 g/dL   HCT 32.4 (L) 40.1 - 02.7 %   MCV 105.5 (H) 80.0 - 100.0 fL   MCH 34.4 (  H) 26.0 - 34.0 pg   MCHC 32.6 30.0 - 36.0 g/dL   RDW 19.1 (H) 47.8 - 29.5 %   Platelets 99 (L) 150 - 400 K/uL   nRBC 0.0 0.0 - 0.2 %  Basic metabolic panel     Status: Abnormal   Collection Time: 11/30/23 12:32 PM  Result Value Ref Range   Sodium 137 135 - 145 mmol/L   Potassium 4.9 3.5 - 5.1 mmol/L   Chloride 103 98 - 111 mmol/L   CO2 23 22 - 32 mmol/L   Glucose, Bld 309 (H) 70 - 99 mg/dL   BUN 34 (H) 8 - 23 mg/dL   Creatinine, Ser 6.21 (H) 0.61 - 1.24 mg/dL   Calcium 8.8 (L) 8.9 - 10.3 mg/dL   GFR, Estimated 40 (L) >60 mL/min   Anion gap 11 5 - 15  CBG monitoring, ED     Status: Abnormal   Collection Time: 11/30/23  6:41 PM  Result Value Ref Range   Glucose-Capillary 257 (H) 70 - 99 mg/dL   *Note: Due to a large number of results and/or encounters for the requested time period, some results have not been displayed. A complete set of results can be found in Results Review.     Imaging Orders         DG Chest Port 1 View      Assessment and Plan   Alex Smith is an 89 y.o. male who presented as a trauma transfer from [facility] after a fall.  Injuries: Multiple left sided rib fractures 3-9 (4,5, 7 segmental) - pain control, pulmonary toilet Extrapleural hematoma near rib fractures - monitor hgb and  vitals Left pleural effusion - repeat CXR in AM  DM - SSI Continue home meds  Consults:  Consider involving hospitalist team in the morning pending clinical progress due to age and comorbidity  FEN - Reg VTE - Lovenox and Sequential Compression Devices ID - None  Dispo - 5N    Junie Olds, MD  [facility] Use [email or web address] to contact on call provider  New Patient Billing: [id number] - Moderate MDM

## 2023-11-30 NOTE — ED Notes (Signed)
 The pt was able to stand with assistance and be changed

## 2023-11-30 NOTE — ED Notes (Signed)
 EMTALA and transfer signature verified by this RN

## 2023-11-30 NOTE — ED Triage Notes (Signed)
 Pt arrives via EMS from the [facility] [address] Per EMS pt fell last night and refused to be transported however, pt has been having muscular pain in the upper/lower chest on both sides with taking in a deep breath.

## 2023-11-30 NOTE — ED Provider Notes (Addendum)
 [facility] Provider Note    Event Date/Time   First MD Initiated Contact with Patient 11/30/23 1111     (approximate)   History   Fall   HPI  Alex Smith is a 89 y.o. male with a history of myelodysplastic syndrome, CKD, dementia presents after a fall.  Patient believes that he tripped over something last night and fell, initially did not want be transported but complains of pain in his left chest today that is significant, worse with deep breath     Physical Exam   Triage Vital Signs: ED Triage Vitals [11/30/23 1026]  Encounter Vitals Group     BP (!) 158/79     Systolic BP Percentile      Diastolic BP Percentile      Pulse Rate 96     Resp 17     Temp 98.4 F (36.9 C)     Temp Source Oral     SpO2 94 %     Weight 88.5 kg (195 lb)     Height 1.702 m (5\' 7" )     Head Circumference      Peak Flow      Pain Score 5     Pain Loc      Pain Education      Exclude from Growth Chart     Most recent vital signs: Vitals:   11/30/23 1133 11/30/23 1134  BP:    Pulse:    Resp:    Temp:    SpO2: (!) 89% 94%     General: Awake, no distress.  CV:  Good peripheral perfusion.  Resp:  Normal effort.  Normal breath sounds bilaterally, tenderness along the left anterior and lateral chest wall Abd:  No distention.  Soft, nontender Other:     ED Results / Procedures / Treatments   Labs (all labs ordered are listed, but only abnormal results are displayed) Labs Reviewed  CBC - Abnormal; Notable for the following components:      Result Value   RBC 2.53 (*)    Hemoglobin 8.7 (*)    HCT 26.7 (*)    MCV 105.5 (*)    MCH 34.4 (*)    RDW 16.8 (*)    Platelets 99 (*)    All other components within normal limits  BASIC METABOLIC PANEL WITH GFR - Abnormal; Notable for the following components:   Glucose, Bld 309 (*)    BUN 34 (*)    Creatinine, Ser 1.65 (*)    Calcium 8.8 (*)    GFR, Estimated 40 (*)    All other components within  normal limits     EKG     RADIOLOGY Chest x-ray demonstrates left-sided rib fractures, will send for CT to characterize better    PROCEDURES:  Critical Care performed: yes  CRITICAL CARE Performed by: Bryson Carbine   Total critical care time: 30 minutes  Critical care time was exclusive of separately billable procedures and treating other patients.  Critical care was necessary to treat or prevent imminent or life-threatening deterioration.  Critical care was time spent personally by me on the following activities: development of treatment plan with patient and/or surrogate as well as nursing, discussions with consultants, evaluation of patient's response to treatment, examination of patient, obtaining history from patient or surrogate, ordering and performing treatments and interventions, ordering and review of laboratory studies, ordering and review of radiographic studies, pulse oximetry and re-evaluation of patient's condition.   Procedures  MEDICATIONS ORDERED IN ED: Medications  morphine (PF) 2 MG/ML injection 2 mg (2 mg Intravenous Given 11/30/23 1139)  ondansetron (ZOFRAN) injection 4 mg (4 mg Intravenous Given 11/30/23 1139)     IMPRESSION / MDM / ASSESSMENT AND PLAN / ED COURSE  I reviewed the triage vital signs and the nursing notes. Patient's presentation is most consistent with acute presentation with potential threat to life or bodily function.  Patient presents after mechanical fall with left-sided chest pain, suspect chest wall contusion versus rib fractures  Chest x-ray is consistent with rib fractures, will obtain CT to characterize better given multiple fractures and history of multiple myeloma.  Will treat with morphine and Zofran  Nurse notes the patient is mildly hypoxic prior to pain medications, this is likely from splinting secondary to fractures, will start on nasal cannula oxygen.  CT demonstrates multiple rib fractures including 2 adjacent  segmental rib fractures as well as 1 additional segmental rib fracture  Given the above and hypoxia will discuss with trauma at [facility]  Accepted by Dr. Ernestine Heads of the trauma service      FINAL CLINICAL IMPRESSION(S) / ED DIAGNOSES   Final diagnoses:  Closed fracture of multiple ribs with flail chest, initial encounter     Rx / DC Orders   ED Discharge Orders     None        Note:  This document was prepared using Dragon voice recognition software and may include unintentional dictation errors.   Bryson Carbine, MD 11/30/23 1437    Bryson Carbine, MD 11/30/23 1438

## 2023-11-30 NOTE — ED Notes (Signed)
 The pt was assisted to a standing position and was able to be cleaned and changed without incident.

## 2023-12-01 ENCOUNTER — Encounter (HOSPITAL_COMMUNITY): Payer: Self-pay

## 2023-12-01 ENCOUNTER — Inpatient Hospital Stay (HOSPITAL_COMMUNITY)

## 2023-12-01 ENCOUNTER — Telehealth: Payer: Self-pay | Admitting: Oncology

## 2023-12-01 LAB — CBC
HCT: 25.1 % — ABNORMAL LOW (ref 39.0–52.0)
HCT: 27 % — ABNORMAL LOW (ref 39.0–52.0)
Hemoglobin: 7.9 g/dL — ABNORMAL LOW (ref 13.0–17.0)
Hemoglobin: 8.6 g/dL — ABNORMAL LOW (ref 13.0–17.0)
MCH: 34.1 pg — ABNORMAL HIGH (ref 26.0–34.0)
MCH: 34.5 pg — ABNORMAL HIGH (ref 26.0–34.0)
MCHC: 31.5 g/dL (ref 30.0–36.0)
MCHC: 31.9 g/dL (ref 30.0–36.0)
MCV: 108.2 fL — ABNORMAL HIGH (ref 80.0–100.0)
MCV: 108.4 fL — ABNORMAL HIGH (ref 80.0–100.0)
Platelets: 82 10*3/uL — ABNORMAL LOW (ref 150–400)
Platelets: 93 10*3/uL — ABNORMAL LOW (ref 150–400)
RBC: 2.32 MIL/uL — ABNORMAL LOW (ref 4.22–5.81)
RBC: 2.49 MIL/uL — ABNORMAL LOW (ref 4.22–5.81)
RDW: 17.2 % — ABNORMAL HIGH (ref 11.5–15.5)
RDW: 17 % — ABNORMAL HIGH (ref 11.5–15.5)
WBC: 4.4 10*3/uL (ref 4.0–10.5)
WBC: 6.3 10*3/uL (ref 4.0–10.5)
nRBC: 0 % (ref 0.0–0.2)
nRBC: 0.3 % — ABNORMAL HIGH (ref 0.0–0.2)

## 2023-12-01 LAB — BASIC METABOLIC PANEL WITH GFR
Anion gap: 7 (ref 5–15)
Anion gap: 7 (ref 5–15)
BUN: 33 mg/dL — ABNORMAL HIGH (ref 8–23)
BUN: 33 mg/dL — ABNORMAL HIGH (ref 8–23)
CO2: 26 mmol/L (ref 22–32)
CO2: 27 mmol/L (ref 22–32)
Calcium: 8.6 mg/dL — ABNORMAL LOW (ref 8.9–10.3)
Calcium: 8.9 mg/dL (ref 8.9–10.3)
Chloride: 104 mmol/L (ref 98–111)
Chloride: 104 mmol/L (ref 98–111)
Creatinine, Ser: 1.84 mg/dL — ABNORMAL HIGH (ref 0.61–1.24)
Creatinine, Ser: 1.86 mg/dL — ABNORMAL HIGH (ref 0.61–1.24)
GFR, Estimated: 35 mL/min — ABNORMAL LOW (ref >60)
GFR, Estimated: 35 mL/min — ABNORMAL LOW (ref >60)
Glucose, Bld: 241 mg/dL — ABNORMAL HIGH (ref 70–99)
Glucose, Bld: 168 mg/dL — ABNORMAL HIGH (ref 70–99)
Potassium: 5.1 mmol/L (ref 3.5–5.1)
Potassium: 4 mmol/L (ref 3.5–5.1)
Sodium: 137 mmol/L (ref 135–145)
Sodium: 138 mmol/L (ref 135–145)

## 2023-12-01 LAB — GLUCOSE, CAPILLARY
Glucose-Capillary: 257 mg/dL — ABNORMAL HIGH (ref 70–99)
Glucose-Capillary: 186 mg/dL — ABNORMAL HIGH (ref 70–99)
Glucose-Capillary: 353 mg/dL — ABNORMAL HIGH (ref 70–99)
Glucose-Capillary: 140 mg/dL — ABNORMAL HIGH (ref 70–99)

## 2023-12-01 MED ORDER — FAMOTIDINE 20 MG PO TABS: 20.0000 mg | ORAL_TABLET | Freq: Every day | ORAL | Status: DC

## 2023-12-01 MED ORDER — TAMSULOSIN HCL 0.4 MG PO CAPS
0.4000 mg | ORAL_CAPSULE | Freq: Every day | ORAL | Status: DC
  Administered 2023-12-01 – 2023-12-08 (×8): 0.4 mg via ORAL
  Filled 2023-12-01 (×8): qty 1

## 2023-12-01 MED ORDER — DONEPEZIL HCL 5 MG PO TABS: 5.0000 mg | ORAL_TABLET | Freq: Every day | ORAL | Status: DC

## 2023-12-01 MED ORDER — VITAMIN E 45 MG (100 UNIT) PO CAPS
400.0000 [IU] | ORAL_CAPSULE | Freq: Every day | ORAL | Status: DC
  Administered 2023-12-02 – 2023-12-09 (×8): 400 [IU] via ORAL
  Filled 2023-12-01 (×8): qty 4

## 2023-12-01 MED ORDER — LOSARTAN POTASSIUM 50 MG PO TABS
25.0000 mg | ORAL_TABLET | Freq: Every day | ORAL | Status: DC
  Administered 2023-12-01 – 2023-12-03 (×3): 25 mg via ORAL
  Filled 2023-12-01 (×3): qty 1

## 2023-12-01 MED ORDER — TORSEMIDE 20 MG PO TABS
20.0000 mg | ORAL_TABLET | Freq: Every day | ORAL | Status: DC
  Administered 2023-12-01 – 2023-12-03 (×3): 20 mg via ORAL
  Filled 2023-12-01 (×3): qty 1

## 2023-12-01 MED ORDER — ADULT MULTIVITAMIN W/MINERALS CH
1.0000 | ORAL_TABLET | Freq: Every day | ORAL | Status: DC
Start: 2023-12-02 — End: 2023-12-09
  Administered 2023-12-02 – 2023-12-09 (×8): 1 via ORAL
  Filled 2023-12-01 (×8): qty 1

## 2023-12-01 MED ORDER — OXYCODONE HCL 5 MG PO TABS
2.5000 mg | ORAL_TABLET | ORAL | Status: DC | PRN
  Administered 2023-12-02 – 2023-12-07 (×5): 5 mg via ORAL
  Filled 2023-12-01 (×6): qty 1

## 2023-12-01 MED ORDER — MELATONIN 5 MG PO TABS
10.0000 mg | ORAL_TABLET | Freq: Every day | ORAL | Status: DC
  Administered 2023-12-01 – 2023-12-03 (×3): 10 mg via ORAL
  Filled 2023-12-01 (×9): qty 2

## 2023-12-01 MED ORDER — VITAMIN B-12 1000 MCG PO TABS
1000.0000 ug | ORAL_TABLET | Freq: Every day | ORAL | Status: DC
  Administered 2023-12-02 – 2023-12-09 (×8): 1000 ug via ORAL
  Filled 2023-12-01 (×8): qty 1

## 2023-12-01 MED ORDER — ORAL CARE MOUTH RINSE: 15.0000 mL | OROMUCOSAL | Status: DC | PRN

## 2023-12-01 MED ORDER — PANTOPRAZOLE SODIUM 40 MG PO TBEC
40.0000 mg | DELAYED_RELEASE_TABLET | Freq: Two times a day (BID) | ORAL | Status: DC
  Administered 2023-12-01 – 2023-12-09 (×16): 40 mg via ORAL
  Filled 2023-12-01 (×16): qty 1

## 2023-12-01 NOTE — Evaluation (Signed)
 Occupational Therapy Evaluation Patient Details Name: Alex Smith MRN: [id number] DOB: 10-Aug-1935 Today's Date: 12/01/2023   History of Present Illness   Pt is an 89 y/o male presenting after a fall at his ALF resulting in L sided rib fractures (3-9), extrapleural hematoma near rib fx and L pleural effusion. PMHx: myelodysplasia, Alzheimer's dementia, hypertension, BPH, CKD stage IIIa, insulin-dependent diabetes.     Clinical Impressions PTA, pt from ALF, typically ambulatory with RW and L post op shoe (given recent toe infection/antibiotics) and typically able to toilet self with Modified Independence. Staff assist with dressing and showering tasks. Pt presents now with deficits in standing balance, L sided pain and cardiopulmonary endurance. Pt requires overall Min A for mobility using RW w/ consistent cues for safe DME use, Min A for UB ADL and overall Mod A for LB ADLs. Pt's son present, reports pt presenting below baseline and interested in postacute rehab at [facility] in [place] to be near family. Feel pt would benefit from postacute rehab stay to maximize safety with ADLs/mobility and decrease fall risk.      If plan is discharge home, recommend the following:   A little help with walking and/or transfers;A lot of help with bathing/dressing/bathroom     Functional Status Assessment   Patient has had a recent decline in their functional status and demonstrates the ability to make significant improvements in function in a reasonable and predictable amount of time.     Equipment Recommendations   None recommended by OT     Recommendations for Other Services         Precautions/Restrictions   Precautions Precautions: Fall;Other (comment) Precaution/Restrictions Comments: monitor O2 (does not wear at baseline) Restrictions Weight Bearing Restrictions Per Provider Order: No Other Position/Activity Restrictions: but has been wearing a post op shoe to L foot to  protect infected toe     Mobility Bed Mobility Overal bed mobility: Needs Assistance Bed Mobility: Supine to Sit     Supine to sit: HOB elevated, Used rails, Mod assist     General bed mobility comments: initial difficulty initiating task and following directions but once engaged, pt able to assist with LE to EOB. use of bedrail and assist to lift trunk    Transfers Overall transfer level: Needs assistance Equipment used: Rolling walker (2 wheels) Transfers: Sit to/from Stand Sit to Stand: Min assist           General transfer comment: Min A from slightly elevated bed to stand with RW      Balance Overall balance assessment: Needs assistance, History of Falls Sitting-balance support: No upper extremity supported, Feet supported Sitting balance-Leahy Scale: Fair     Standing balance support: Bilateral upper extremity supported, During functional activity Standing balance-Leahy Scale: Poor Standing balance comment: reliant on at least one UE support                           ADL either performed or assessed with clinical judgement   ADL Overall ADL's : Needs assistance/impaired Eating/Feeding: Set up;Sitting   Grooming: Set up;Sitting   Upper Body Bathing: Minimal assistance;Sitting   Lower Body Bathing: Moderate assistance;Sitting/lateral leans;Sit to/from stand   Upper Body Dressing : Minimal assistance;Sitting   Lower Body Dressing: Moderate assistance;Sit to/from stand;Sitting/lateral leans   Toilet Transfer: Minimal assistance;Ambulation;Rolling walker (2 wheels)   Toileting- Clothing Manipulation and Hygiene: Sitting/lateral lean;Sit to/from stand;Moderate assistance       Functional mobility during ADLs:  Minimal assistance;Rolling walker (2 wheels)       Vision Baseline Vision/History: 1 Wears glasses Ability to See in Adequate Light: 0 Adequate Patient Visual Report: No change from baseline Vision Assessment?: No apparent visual  deficits;Wears glasses for reading     Perception         Praxis         Pertinent Vitals/Pain Pain Assessment Pain Assessment: Faces Faces Pain Scale: Hurts little more Pain Location: L side/ribs Pain Descriptors / Indicators: Sore Pain Intervention(s): Monitored during session, Limited activity within patient's tolerance     Extremity/Trunk Assessment Upper Extremity Assessment Upper Extremity Assessment: Generalized weakness;Right hand dominant   Lower Extremity Assessment Lower Extremity Assessment: Defer to PT evaluation   Cervical / Trunk Assessment Cervical / Trunk Assessment: Other exceptions Cervical / Trunk Exceptions: L rib fx   Communication Communication Communication: No apparent difficulties   Cognition Arousal: Alert Behavior During Therapy: WFL for tasks assessed/performed Cognition: History of cognitive impairments             OT - Cognition Comments: hx of dementia, very pleasant. multimodal cues for directions, shows insight into balance deficits                 Following commands: Impaired Following commands impaired: Follows one step commands with increased time, Only follows one step commands consistently     Cueing  General Comments   Cueing Techniques: Verbal cues;Gestural cues;Tactile cues  Son present and supportive. SpO2 94% on 3 L O2 at rest, 84% immediately after sitting in chair then reading 90% after 1 min in chair on 3 L O2   Exercises     Shoulder Instructions      Home Living Family/patient expects to be discharged to:: Assisted living                             Home Equipment: Rolling Walker (2 wheels);Shower seat - built in   Additional Comments: [facility]      Prior Functioning/Environment Prior Level of Function : Needs assist             Mobility Comments: MOD I with RW use ADLs Comments: assist for bathing, dressing (post op shoe especially) and IADLs     OT Problem List: Decreased strength;Decreased activity tolerance;Impaired balance (sitting and/or standing);Decreased cognition;Decreased safety awareness;Decreased knowledge of use of DME or AE;Cardiopulmonary status limiting activity;Pain   OT Treatment/Interventions: Self-care/ADL training;Therapeutic exercise;Energy conservation;DME and/or AE instruction;Therapeutic activities;Patient/family education;Balance training      OT Goals(Current goals can be found in the care plan section)   Acute Rehab OT Goals Patient Stated Goal: interested in rehab at [facility] Goal Formulation: With patient/family Time For Goal Achievement: 12/15/23 Potential to Achieve Goals: Good   OT Frequency:  Min 2X/week    Co-evaluation              AM-PAC OT "6 Clicks" Daily Activity     Outcome Measure Help from another person eating meals?: A Little Help from another person taking care of personal grooming?: A Little Help from another person toileting, which includes using toliet, bedpan, or urinal?: A Lot Help from another person bathing (including washing, rinsing, drying)?: A Lot Help from another person to put on and taking off regular upper body clothing?: A Little Help from another person to put on and taking off regular lower body clothing?: A Lot 6 Click Score: 15  End of Session Equipment Utilized During Treatment: Gait belt;Rolling walker (2 wheels) Nurse Communication: Mobility status  Activity Tolerance: Patient tolerated treatment well Patient left: in chair;with call bell/phone within reach;with family/visitor present  OT Visit Diagnosis: Unsteadiness on feet (R26.81);Other abnormalities of gait and mobility (R26.89);Muscle weakness (generalized) (M62.81)                Time: 1353-1420 OT Time Calculation (min): 27 min Charges:  OT General Charges $OT Visit: 1 Visit OT Evaluation $OT Eval Moderate Complexity: 1 Mod  Lawrence Pretty, OTR/L Acute Rehab Services Office:  [phone]   Annabella Barr 12/01/2023, 2:37 PM

## 2023-12-01 NOTE — Progress Notes (Signed)
 Progress Note     Subjective: Pain in left ribs. Son is here from [place] and reports patient's daughter who lives in [place] will be here tomorrow. Pt lives in ALF normally but they do not have SNF where he lives. He has been seeing podiatry outpatient for left 2nd toe infection and would like some one to wash and dress wound.   Objective: Vital signs in last 24 hours: Temp:  [97.5 F (36.4 C)-97.9 F (36.6 C)] 97.6 F (36.4 C) (04/14 1428) Pulse Rate:  [80-90] 80 (04/14 1428) Resp:  [16-20] 16 (04/14 1428) BP: (131-167)/(63-77) 131/63 (04/14 1428) SpO2:  [93 %-97 %] 96 % (04/14 1428)    Intake/Output from previous day: No intake/output data recorded. Intake/Output this shift: No intake/output data recorded.  PE: General: pleasant, WD, elderly male who is laying in bed in NAD HEENT: head is normocephalic, atraumatic. EOMI Heart: regular, rate, and rhythm. Palpable radial and pedal pulses bilaterally Lungs: CTAB, no wheezes, rhonchi, or rales noted.  Respiratory effort nonlabored Abd: soft, NT, ND MS: L 2nd toe with some dry eschar no erythema or purulent drainage    Lab Results:  Recent Labs    12/01/23 0228 12/01/23 1252  WBC 4.4 6.3  HGB 7.9* 8.6*  HCT 25.1* 27.0*  PLT 82* 93*   BMET Recent Labs    12/01/23 0228 12/01/23 1252  NA 137 138  K 5.1 4.0  CL 104 104  CO2 26 27  GLUCOSE 241* 168*  BUN 33* 33*  CREATININE 1.84* 1.86*  CALCIUM 8.6* 8.9   PT/INR No results for input(s): "LABPROT", "INR" in the last 72 hours. CMP     Component Value Date/Time   NA 138 12/01/2023 1252   K 4.0 12/01/2023 1252   K 4.5 09/19/2014 1511   CL 104 12/01/2023 1252   CO2 27 12/01/2023 1252   GLUCOSE 168 (H) 12/01/2023 1252   BUN 33 (H) 12/01/2023 1252   CREATININE 1.86 (H) 12/01/2023 1252   CREATININE 1.52 (H) 09/03/2023 1020   CREATININE 1.45 (H) 11/14/2017 1647   CALCIUM 8.9 12/01/2023 1252   PROT 7.5 11/05/2023 1013   PROT 7.5 01/10/2012 1539    ALBUMIN 3.7 11/05/2023 1013   ALBUMIN 4.0 01/10/2012 1539   AST 17 11/05/2023 1013   AST 16 09/03/2023 1020   ALT 14 11/05/2023 1013   ALT 13 09/03/2023 1020   ALT 29 01/10/2012 1539   ALKPHOS 79 11/05/2023 1013   ALKPHOS 53 08/20/2013 0000   BILITOT 1.0 11/05/2023 1013   BILITOT 0.7 09/03/2023 1020   GFRNONAA 35 (L) 12/01/2023 1252   GFRNONAA 44 (L) 09/03/2023 1020   GFRNONAA 58 (L) 12/14/2014 1109   GFRAA 45 (L) 05/05/2020 1314   GFRAA >60 12/14/2014 1109   Lipase     Component Value Date/Time   LIPASE 19 08/13/2023 1610       Studies/Results: DG Chest Port 1 View Result Date: 12/01/2023 CLINICAL DATA:  Fall.  Rib fracture. EXAM: PORTABLE CHEST 1 VIEW COMPARISON:  11/30/2023 FINDINGS: The cardio pericardial silhouette is enlarged. Low volume film with bibasilar atelectasis. Retrocardiac opacity is progressive in the interval. Small left pleural effusion again noted. No evidence for pneumothorax. Multiple left-sided rib fractures again noted. IMPRESSION: 1. Low volume film with bibasilar atelectasis. 2. Progressive retrocardiac opacity. Atelectasis favored. 3. Small left pleural effusion. 4. Multiple left-sided rib fractures. Electronically Signed   By: Kennith Center M.D.   On: 12/01/2023 07:58   CT Chest Wo Contrast  Result Date: 11/30/2023 CLINICAL DATA:  Patient fell last night. Now with muscular pain in the upper and lower chest bilaterally. EXAM: CT CHEST WITHOUT CONTRAST TECHNIQUE: Multidetector CT imaging of the chest was performed following the standard protocol without IV contrast. RADIATION DOSE REDUCTION: This exam was performed according to the departmental dose-optimization program which includes automated exposure control, adjustment of the mA and/or kV according to patient size and/or use of iterative reconstruction technique. COMPARISON:  08/13/2023 FINDINGS: Cardiovascular: The heart size is normal. No substantial pericardial effusion. Trace pericardial effusion is  similar to prior. Coronary artery calcification is evident. Mild atherosclerotic calcification is noted in the wall of the thoracic aorta. Aortic valve calcification evident. Mediastinum/Nodes: No mediastinal lymphadenopathy. No evidence for gross hilar lymphadenopathy although assessment is limited by the lack of intravenous contrast on the current study. Small hiatal hernia The esophagus has normal imaging features. There is no axillary lymphadenopathy. Lungs/Pleura: No pneumothorax. Mild cylindrical bronchiectasis noted in the lower lungs bilaterally. Streaky atelectasis or scarring noted in the inferior right lung. There is left lower lobe collapse/consolidation with small left pleural fluid collection. Upper Abdomen: Calcified gallstones evident. Calcified granulomata noted in the spleen. Pancreas is diffusely atrophic. Musculoskeletal: Numerous left-sided rib fractures including anterior third fourth and fifth ribs. Posterolateral fractures noted fourth through ninth ribs. Fractures at ribs 4, 5, and 7 are segmental. Subtle deformity noted in multiple right-sided ribs, likely sequelae of old trauma, but no definite acute right-sided rib fracture evident. No thoracic spine fracture. No sternal fracture. No evidence for scapular fracture IMPRESSION: 1. Numerous left-sided rib fractures including anterior third fourth and fifth ribs. Posterolateral fractures noted fourth through ninth left ribs. Fractures at left ribs 4, 5, and 7 are segmental. No pneumothorax. 2. Extrapleural densities adjacent to the left-sided rib fractures are consistent with hemorrhage/hematoma. 3. Left lower lobe collapse/consolidation with small left pleural fluid collection. Free-flowing fluid in the left pleural space has relatively low density, not highly suspicious for hemothorax. 4. No evidence for left-sided pneumothorax. 5. Cholelithiasis. 6. Aortic atherosclerosis. Electronically Signed   By: Kennith Center M.D.   On: 11/30/2023  13:18   DG Chest 2 View Result Date: 11/30/2023 CLINICAL DATA:  Status post fall. EXAM: CHEST - 2 VIEW COMPARISON:  09/16/2023 FINDINGS: Multiple acute to subacute left-sided rib fractures evident with left base atelectasis and small left pleural effusion. Stable linear density at the right base is compatible with chronic atelectasis or scar. The cardio pericardial silhouette is enlarged. Bones are diffusely demineralized. IMPRESSION: Multiple acute to subacute left-sided rib fractures with left base atelectasis and small left pleural effusion. Electronically Signed   By: Kennith Center M.D.   On: 11/30/2023 11:05    Anti-infectives: Anti-infectives (From admission, onward)    None        Assessment/Plan  Fall from standing  L 3-9 rib fractures with L pleural effusion - multimodal pain control, IS, pulm toilet, CXR stable this AM Extrapleural hematoma - hgb stable at 8.6 but plts low, hold LMWH Thrombocytopenia - plts <100K, hold LMWH T2DM - SSI Chronic L 2nd toe infection - wound care ordered, f/u outpatient with podiatry CKD stage III - monitor Cr HTN HLD BPH Dementia   FEN: CM diet, SLIV VTE: hold LMWH ID: no current abx  Dispo: 5N. Therapies recommending SNF. Medically stable for DC when bed available and insurance approves.     LOS: 1 day   I reviewed last 24 h vitals and pain scores, last 48 h intake  and output, last 24 h labs and trends, and last 24 h imaging results.  This care required moderate level of medical decision making.    Annetta Killian, [facility] 12/01/2023, 4:06 PM Please see Amion for pager number during day hours 7:00am-4:30pm

## 2023-12-01 NOTE — Inpatient Diabetes Management (Signed)
 Inpatient Diabetes Program Recommendations  AACE/ADA: New Consensus Statement on Inpatient Glycemic Control (2015)  Target Ranges:  Prepandial:   less than 140 mg/dL      [facility] postprandial:   less than 180 mg/dL (1-2 hours)      Critically ill patients:  140 - 180 mg/dL    Latest Reference Range & Units 11/30/23 18:41 11/30/23 23:16 12/01/23 08:48  Glucose-Capillary 70 - 99 mg/dL 366 (H) 440 (H)  3 units Novolog  257 (H)  8 units Novolog   (H): Data is abnormally high    Admit with: Fall with Multiple left sided rib fractures/ Extrapleural hematoma near rib fractures/ Left pleural effusion  History: DM, Dementia, CKD  Home DM Meds: Lantus 14 units at bedtime       Humalog 2 units TID with meals  Current Orders: Novolog Moderate Correction Scale/ SSI (0-15 units) TID AC + HS    MD- Note pt takes Lantus as outpatient  Please consider starting Semglee 10 units at bedtime (80% home dose)    --Will follow patient during hospitalization--  Langston Pippins RN, MSN, CDCES Diabetes Coordinator Inpatient Glycemic Control Team Team Pager: [phone] (8a-5p)

## 2023-12-01 NOTE — Progress Notes (Signed)
 Transition of Care [facility]) - CAGE-AID Screening   Patient Details  Name: Alex Smith MRN: [id number] Date of Birth: March 13, 1935  [facility] CM/SW Contact:    Brunetta Capes, RN Phone Number: 12/01/2023, 8:48 PM   Clinical Narrative:  Patient denies the use of alcohol and illicit substances. Resources not given at this time.  CAGE-AID Screening:    Have You Ever Felt You Ought to Cut Down on Your Drinking or Drug Use?: No Have People Annoyed You By Critizing Your Drinking Or Drug Use?: No Have You Felt Bad Or Guilty About Your Drinking Or Drug Use?: No Have You Ever Had a Drink or Used Drugs First Thing In The Morning to Steady Your Nerves or to Get Rid of a Hangover?: No CAGE-AID Score: 0  Substance Abuse Education Offered: No

## 2023-12-01 NOTE — Telephone Encounter (Signed)
 Pt daughter called and stated pt had a bad fall and is in the hospital. They do not know how long pt will be in the hospital and wanted to let the MD know and ask her when she wants to r/s the lab/injection appts. Pt daughter also thinks pt missed the last injection as well.

## 2023-12-01 NOTE — Evaluation (Signed)
 Physical Therapy Evaluation  Patient Details Name: Alex Smith MRN: [id number] DOB: [birth year] Today's Date: 12/01/2023  History of Present Illness  Pt is an 89 y/o male presenting after a fall at his ALF resulting in L sided rib fractures (3-9), extrapleural hematoma near rib fx and L pleural effusion. PMHx: myelodysplasia, Alzheimer's dementia, hypertension, BPH, CKD stage IIIa, insulin-dependent diabetes.   Clinical Impression  Pt admitted with above diagnosis. Pt currently with functional limitations due to the deficits listed below (see PT Problem List). At the time of PT eval pt was able to perform transfers and ambulation with up to +2 min assist and RW for support. Noted pt's SpO2 decreased to 84% after gait training on 3L/min supplemental O2. Pt is not on O2 at baseline. Recommend post-acute rehab <3 hours/day to maximize functional independence, safety, and facilitate return to ALF. Pt and family prefer a specific SNF location and CSW updated on this information after session. Pt will benefit from acute skilled PT to increase their independence and safety with mobility to allow discharge.           If plan is discharge home, recommend the following: A little help with walking and/or transfers;Assistance with cooking/housework;A lot of help with bathing/dressing/bathroom;Direct supervision/assist for medications management;Direct supervision/assist for financial management;Assist for transportation;Supervision due to cognitive status;Help with stairs or ramp for entrance   Can travel by private vehicle   Yes    Equipment Recommendations None recommended by PT  Recommendations for Other Services       Functional Status Assessment Patient has had a recent decline in their functional status and demonstrates the ability to make significant improvements in function in a reasonable and predictable amount of time.     Precautions / Restrictions Precautions Precautions: Fall;Other  (comment) Recall of Precautions/Restrictions: Impaired Precaution/Restrictions Comments: monitor O2 (does not wear at baseline) Restrictions Weight Bearing Restrictions Per Provider Order: No Other Position/Activity Restrictions: but has been wearing a post op shoe to L foot to protect infected toe      Mobility  Bed Mobility Overal bed mobility: Needs Assistance Bed Mobility: Supine to Sit     Supine to sit: HOB elevated, Used rails, Mod assist, +2 for physical assistance     General bed mobility comments: Pt slow to follow commands initially with advancing LE's towards EOB (to the L to simulate home environment). Multimodal cues for movement, use of rails and transitioning fully to EOB with feet on the floor.    Transfers Overall transfer level: Needs assistance Equipment used: Rolling walker (2 wheels) Transfers: Sit to/from Stand Sit to Stand: Min assist, +2 safety/equipment           General transfer comment: VC's for hand placement on seated surface for safety.    Ambulation/Gait Ambulation/Gait assistance: Min assist, +2 physical assistance, +2 safety/equipment Gait Distance (Feet): 50 Feet Assistive device: Rolling walker (2 wheels) Gait Pattern/deviations: Step-through pattern, Decreased stride length, Trunk flexed Gait velocity: Decreased Gait velocity interpretation: <1.31 ft/sec, indicative of household ambulator   General Gait Details: MUltimodal cues throughout for improved posture, closer walker proximity and forward gaze. Assist for balance support, walker management, and safety.  Stairs            Wheelchair Mobility     Tilt Bed    Modified Rankin (Stroke Patients Only)       Balance Overall balance assessment: Needs assistance, History of Falls Sitting-balance support: No upper extremity supported, Feet supported Sitting balance-Leahy Scale: Fair  Standing balance support: Bilateral upper extremity supported, During functional  activity Standing balance-Leahy Scale: Poor Standing balance comment: reliant on at least one UE support                             Pertinent Vitals/Pain Pain Assessment Pain Assessment: Faces Faces Pain Scale: Hurts little more Pain Location: L side/ribs Pain Descriptors / Indicators: Sore Pain Intervention(s): Limited activity within patient's tolerance, Monitored during session, Repositioned    Home Living Family/patient expects to be discharged to:: Assisted living                 Home Equipment: Agricultural consultant (2 wheels);Shower seat - built in;Rollator (4 wheels) Additional Comments: Pt from [facility]. Son, [name] reports that pt is typically pretty independent and motivated to get up and go out walking. Family took the rollator away from him because of safety.    Prior Function Prior Level of Function : Needs assist             Mobility Comments: MOD I with RW use ADLs Comments: assist for bathing, dressing (post op shoe especially) and IADLs     Extremity/Trunk Assessment   Upper Extremity Assessment Upper Extremity Assessment: Generalized weakness    Lower Extremity Assessment Lower Extremity Assessment: Generalized weakness;LLE deficits/detail LLE Deficits / Details: Pt's son reports toe infection on the L foot, and recently presented to an MD in [facility] about it. They determined the infection has spread to the bone and are back and forth about amputation vs antibiotics and they are going with antibiotics for now. Pt is supposed to have the post-op shoe on when OOB, unfortunately fell due to post-op shoe leading to this admission.    Cervical / Trunk Assessment Cervical / Trunk Assessment: Other exceptions Cervical / Trunk Exceptions: Multiple L rib fx  Communication   Communication Communication: No apparent difficulties    Cognition Arousal: Alert Behavior During Therapy: WFL for tasks  assessed/performed   PT - Cognitive impairments: History of cognitive impairments                         Following commands: Impaired Following commands impaired: Follows one step commands with increased time, Only follows one step commands consistently     Cueing Cueing Techniques: Verbal cues, Gestural cues, Tactile cues     General Comments General comments (skin integrity, edema, etc.): Son present and supportive. SpO2 94% on 3 L O2 at rest, 84% immediately after sitting in chair then reading 90% after 1 min in chair on 3 L O2    Exercises     Assessment/Plan    PT Assessment Patient needs continued PT services  PT Problem List Decreased strength;Decreased activity tolerance;Decreased balance;Decreased mobility;Decreased knowledge of use of DME;Decreased safety awareness;Decreased knowledge of precautions;Pain       PT Treatment Interventions DME instruction;Gait training;Functional mobility training;Therapeutic activities;Therapeutic exercise;Balance training;Patient/family education    PT Goals (Current goals can be found in the Care Plan section)  Acute Rehab PT Goals Patient Stated Goal: SNF then back to ALF PT Goal Formulation: With patient/family Time For Goal Achievement: 12/15/23 Potential to Achieve Goals: Good    Frequency Min 2X/week     Co-evaluation               AM-PAC PT "6 Clicks" Mobility  Outcome Measure Help needed turning from your back to your  side while in a flat bed without using bedrails?: A Little Help needed moving from lying on your back to sitting on the side of a flat bed without using bedrails?: A Lot Help needed moving to and from a bed to a chair (including a wheelchair)?: A Little Help needed standing up from a chair using your arms (e.g., wheelchair or bedside chair)?: A Little Help needed to walk in hospital room?: A Little Help needed climbing 3-5 steps with a railing? : Total 6 Click Score: 15    End of  Session Equipment Utilized During Treatment: Gait belt;Oxygen Activity Tolerance: Patient tolerated treatment well Patient left: in chair;with call bell/phone within reach;with chair alarm set Nurse Communication: Mobility status PT Visit Diagnosis: Unsteadiness on feet (R26.81);Pain;Difficulty in walking, not elsewhere classified (R26.2) Pain - Right/Left: Left Pain - part of body:  (ribs)    Time: 1357-1430 PT Time Calculation (min) (ACUTE ONLY): 33 min   Charges:   PT Evaluation $PT Eval Moderate Complexity: 1 Mod   PT General Charges $$ ACUTE PT VISIT: 1 Visit         Simone Dubois, PT, DPT Acute Rehabilitation Services Secure Chat Preferred Office: [phone]   Venus Ginsberg 12/01/2023, 2:55 PM

## 2023-12-01 NOTE — Telephone Encounter (Signed)
 Push out next injection of luspatercept  by 4 weeks as of now

## 2023-12-02 LAB — GLUCOSE, CAPILLARY
Glucose-Capillary: 249 mg/dL — ABNORMAL HIGH (ref 70–99)
Glucose-Capillary: 262 mg/dL — ABNORMAL HIGH (ref 70–99)
Glucose-Capillary: 204 mg/dL — ABNORMAL HIGH (ref 70–99)
Glucose-Capillary: 264 mg/dL — ABNORMAL HIGH (ref 70–99)

## 2023-12-02 LAB — CBC
HCT: 24.9 % — ABNORMAL LOW (ref 39.0–52.0)
Hemoglobin: 7.8 g/dL — ABNORMAL LOW (ref 13.0–17.0)
MCH: 34.5 pg — ABNORMAL HIGH (ref 26.0–34.0)
MCHC: 31.3 g/dL (ref 30.0–36.0)
MCV: 110.2 fL — ABNORMAL HIGH (ref 80.0–100.0)
Platelets: 79 10*3/uL — ABNORMAL LOW (ref 150–400)
RBC: 2.26 MIL/uL — ABNORMAL LOW (ref 4.22–5.81)
RDW: 16.7 % — ABNORMAL HIGH (ref 11.5–15.5)
WBC: 5.3 10*3/uL (ref 4.0–10.5)
nRBC: 0.6 % — ABNORMAL HIGH (ref 0.0–0.2)

## 2023-12-02 LAB — BASIC METABOLIC PANEL WITH GFR
Anion gap: 7 (ref 5–15)
BUN: 38 mg/dL — ABNORMAL HIGH (ref 8–23)
CO2: 26 mmol/L (ref 22–32)
Calcium: 8.2 mg/dL — ABNORMAL LOW (ref 8.9–10.3)
Chloride: 103 mmol/L (ref 98–111)
Creatinine, Ser: 2.2 mg/dL — ABNORMAL HIGH (ref 0.61–1.24)
GFR, Estimated: 28 mL/min — ABNORMAL LOW (ref >60)
Glucose, Bld: 225 mg/dL — ABNORMAL HIGH (ref 70–99)
Potassium: 4.3 mmol/L (ref 3.5–5.1)
Sodium: 136 mmol/L (ref 135–145)

## 2023-12-02 MED ORDER — INSULIN ASPART 100 UNIT/ML IJ SOLN
2.0000 [IU] | Freq: Three times a day (TID) | INTRAMUSCULAR | Status: DC
  Administered 2023-12-02 – 2023-12-03 (×3): 2 [IU] via SUBCUTANEOUS

## 2023-12-02 MED ORDER — INSULIN GLARGINE-YFGN 100 UNIT/ML ~~LOC~~ SOLN
8.0000 [IU] | Freq: Every day | SUBCUTANEOUS | Status: DC
  Administered 2023-12-02: 8 [IU] via SUBCUTANEOUS
  Filled 2023-12-02 (×2): qty 0.08

## 2023-12-02 MED ORDER — LIDOCAINE 5 % EX PTCH
2.0000 | MEDICATED_PATCH | CUTANEOUS | Status: DC
  Administered 2023-12-02 – 2023-12-09 (×8): 2 via TRANSDERMAL
  Filled 2023-12-02 (×7): qty 2

## 2023-12-02 MED ORDER — METHOCARBAMOL 500 MG PO TABS
1000.0000 mg | ORAL_TABLET | Freq: Three times a day (TID) | ORAL | Status: DC
  Administered 2023-12-02 – 2023-12-03 (×3): 1000 mg via ORAL
  Filled 2023-12-02 (×3): qty 2

## 2023-12-02 MED ORDER — SODIUM CHLORIDE 0.9 % IV SOLN
INTRAVENOUS | Status: AC
Start: 2023-12-02 — End: 2023-12-03

## 2023-12-02 NOTE — Plan of Care (Signed)
  Problem: Education: Goal: Knowledge of General Education information will improve Description: Including pain rating scale, medication(s)/side effects and non-pharmacologic comfort measures Outcome: Not Progressing   Problem: Clinical Measurements: Goal: Ability to maintain clinical measurements within normal limits will improve Outcome: Progressing   Problem: Activity: Goal: Risk for activity intolerance will decrease Outcome: Progressing   Problem: Nutrition: Goal: Adequate nutrition will be maintained Outcome: Progressing   Problem: Coping: Goal: Level of anxiety will decrease Outcome: Progressing

## 2023-12-02 NOTE — Progress Notes (Addendum)
 Progress Note     Subjective: Pain in left ribs. Sitting up in chair. No family present this AM.   Objective: Vital signs in last 24 hours: Temp:  [97.6 F (36.4 C)-97.8 F (36.6 C)] 97.8 F (36.6 C) (04/15 0732) Pulse Rate:  [67-80] 67 (04/15 0732) Resp:  [16-19] 19 (04/15 0732) BP: (131-154)/(56-85) 135/60 (04/15 0732) SpO2:  [94 %-97 %] 94 % (04/15 0732) Last BM Date :  (UTA)  Intake/Output from previous day: 04/14 0701 - 04/15 0700 In: 240 [P.O.:240] Out: -  Intake/Output this shift: No intake/output data recorded.  PE: General: pleasant, WD, elderly male, NAD HEENT: head is normocephalic, atraumatic. EOMI Heart: regular, rate, and rhythm. Palpable radial and pedal pulses bilaterally Lungs: CTAB, no wheezes, rhonchi, or rales noted.  Respiratory effort nonlabored on supplemental O2 Abd: soft, NT, ND     Lab Results:  Recent Labs    12/01/23 1252 12/02/23 0435  WBC 6.3 5.3  HGB 8.6* 7.8*  HCT 27.0* 24.9*  PLT 93* 79*   BMET Recent Labs    12/01/23 1252 12/02/23 0435  NA 138 136  K 4.0 4.3  CL 104 103  CO2 27 26  GLUCOSE 168* 225*  BUN 33* 38*  CREATININE 1.86* 2.20*  CALCIUM 8.9 8.2*   PT/INR No results for input(s): "LABPROT", "INR" in the last 72 hours. CMP     Component Value Date/Time   NA 136 12/02/2023 0435   K 4.3 12/02/2023 0435   K 4.5 09/19/2014 1511   CL 103 12/02/2023 0435   CO2 26 12/02/2023 0435   GLUCOSE 225 (H) 12/02/2023 0435   BUN 38 (H) 12/02/2023 0435   CREATININE 2.20 (H) 12/02/2023 0435   CREATININE 1.52 (H) 09/03/2023 1020   CREATININE 1.45 (H) 11/14/2017 1647   CALCIUM 8.2 (L) 12/02/2023 0435   PROT 7.5 11/05/2023 1013   PROT 7.5 01/10/2012 1539   ALBUMIN 3.7 11/05/2023 1013   ALBUMIN 4.0 01/10/2012 1539   AST 17 11/05/2023 1013   AST 16 09/03/2023 1020   ALT 14 11/05/2023 1013   ALT 13 09/03/2023 1020   ALT 29 01/10/2012 1539   ALKPHOS 79 11/05/2023 1013   ALKPHOS 53 08/20/2013 0000   BILITOT 1.0  11/05/2023 1013   BILITOT 0.7 09/03/2023 1020   GFRNONAA 28 (L) 12/02/2023 0435   GFRNONAA 44 (L) 09/03/2023 1020   GFRNONAA 58 (L) 12/14/2014 1109   GFRAA 45 (L) 05/05/2020 1314   GFRAA >60 12/14/2014 1109   Lipase     Component Value Date/Time   LIPASE 19 08/13/2023 4098       Studies/Results: DG Chest Port 1 View Result Date: 12/01/2023 CLINICAL DATA:  Fall.  Rib fracture. EXAM: PORTABLE CHEST 1 VIEW COMPARISON:  11/30/2023 FINDINGS: The cardio pericardial silhouette is enlarged. Low volume film with bibasilar atelectasis. Retrocardiac opacity is progressive in the interval. Small left pleural effusion again noted. No evidence for pneumothorax. Multiple left-sided rib fractures again noted. IMPRESSION: 1. Low volume film with bibasilar atelectasis. 2. Progressive retrocardiac opacity. Atelectasis favored. 3. Small left pleural effusion. 4. Multiple left-sided rib fractures. Electronically Signed   By: Kennith Center M.D.   On: 12/01/2023 07:58   CT Chest Wo Contrast Result Date: 11/30/2023 CLINICAL DATA:  Patient fell last night. Now with muscular pain in the upper and lower chest bilaterally. EXAM: CT CHEST WITHOUT CONTRAST TECHNIQUE: Multidetector CT imaging of the chest was performed following the standard protocol without IV contrast. RADIATION DOSE REDUCTION: This  exam was performed according to the departmental dose-optimization program which includes automated exposure control, adjustment of the mA and/or kV according to patient size and/or use of iterative reconstruction technique. COMPARISON:  08/13/2023 FINDINGS: Cardiovascular: The heart size is normal. No substantial pericardial effusion. Trace pericardial effusion is similar to prior. Coronary artery calcification is evident. Mild atherosclerotic calcification is noted in the wall of the thoracic aorta. Aortic valve calcification evident. Mediastinum/Nodes: No mediastinal lymphadenopathy. No evidence for gross hilar  lymphadenopathy although assessment is limited by the lack of intravenous contrast on the current study. Small hiatal hernia The esophagus has normal imaging features. There is no axillary lymphadenopathy. Lungs/Pleura: No pneumothorax. Mild cylindrical bronchiectasis noted in the lower lungs bilaterally. Streaky atelectasis or scarring noted in the inferior right lung. There is left lower lobe collapse/consolidation with small left pleural fluid collection. Upper Abdomen: Calcified gallstones evident. Calcified granulomata noted in the spleen. Pancreas is diffusely atrophic. Musculoskeletal: Numerous left-sided rib fractures including anterior third fourth and fifth ribs. Posterolateral fractures noted fourth through ninth ribs. Fractures at ribs 4, 5, and 7 are segmental. Subtle deformity noted in multiple right-sided ribs, likely sequelae of old trauma, but no definite acute right-sided rib fracture evident. No thoracic spine fracture. No sternal fracture. No evidence for scapular fracture IMPRESSION: 1. Numerous left-sided rib fractures including anterior third fourth and fifth ribs. Posterolateral fractures noted fourth through ninth left ribs. Fractures at left ribs 4, 5, and 7 are segmental. No pneumothorax. 2. Extrapleural densities adjacent to the left-sided rib fractures are consistent with hemorrhage/hematoma. 3. Left lower lobe collapse/consolidation with small left pleural fluid collection. Free-flowing fluid in the left pleural space has relatively low density, not highly suspicious for hemothorax. 4. No evidence for left-sided pneumothorax. 5. Cholelithiasis. 6. Aortic atherosclerosis. Electronically Signed   By: Kennith Center M.D.   On: 11/30/2023 13:18   DG Chest 2 View Result Date: 11/30/2023 CLINICAL DATA:  Status post fall. EXAM: CHEST - 2 VIEW COMPARISON:  09/16/2023 FINDINGS: Multiple acute to subacute left-sided rib fractures evident with left base atelectasis and small left pleural  effusion. Stable linear density at the right base is compatible with chronic atelectasis or scar. The cardio pericardial silhouette is enlarged. Bones are diffusely demineralized. IMPRESSION: Multiple acute to subacute left-sided rib fractures with left base atelectasis and small left pleural effusion. Electronically Signed   By: Kennith Center M.D.   On: 11/30/2023 11:05    Anti-infectives: Anti-infectives (From admission, onward)    None        Assessment/Plan  Fall from standing  L 3-9 rib fractures with L pleural effusion - multimodal pain control, IS, pulm toilet, CXR stable 4/14 Extrapleural hematoma - hgb 7.8, plts low, hold LMWH Chronic Thrombocytopenia - plts 79K, hold LMWH T2DM - SSI, added semglee 8 u at bedtime and 2u novolog for meal coverage per DM coordinator recs Chronic L 2nd toe infection - wound care ordered, f/u outpatient with podiatry AKI on CKD stage III - Cr 2.2 from 1.86, stopped sched ibuprofen, MIVF today and repeat BMET in AM. Baseline appears around 1.4 HTN HLD BPH Dementia   FEN: CM diet, NS @50  cc/h VTE: hold LMWH ID: no current abx  Dispo: 5N. Therapies recommending SNF. Gentle IVF for AKI today and repeat labs in AM    LOS: 2 days   I reviewed last 24 h vitals and pain scores, last 48 h intake and output, last 24 h labs and trends, and last 24 h imaging results.  This care required moderate level of medical decision making.    Annetta Killian, [facility] 12/02/2023, 10:27 AM Please see Amion for pager number during day hours 7:00am-4:30pm

## 2023-12-02 NOTE — TOC Initial Note (Addendum)
 Transition of Care [facility]) - Initial/Assessment Note    Patient Details  Name: Alex Smith MRN: [id number] Date of Birth: 1935/03/28  [facility] CM/SW Contact:    Elspeth Hals, LCSW Phone Number: 12/02/2023, 10:22 AM  Clinical Narrative:     Pt oriented x2.  He was able to answer questions but falling asleep as CSW spoke with him yesterday afternoon.  CSW spoken with pt daughter Mariah Shines today, she confirms pt from [facility].  She does want to move forward with SNF placement, requesting [facility].  Mariah Shines will be at the hospital this AM.  Referral sent to [facility], CSW reached out to [facility] to review.               1115: [facility] does offer bed, CSW spoke with daughter and pt in room, they do accept offer.  SNF auth request submitted to [facility].   Expected Discharge Plan: Skilled Nursing Facility Barriers to Discharge: Continued Medical Work up, SNF Pending bed offer   Patient Goals and CMS Choice   CMS Medicare.gov Compare Post Acute Care list provided to::  (daughter Mariah Shines) Choice offered to / list presented to : Adult Children (daughter Mariah Shines)      Expected Discharge Plan and Services     Post Acute Care Choice: Skilled Nursing Facility Living arrangements for the past 2 months: Assisted Living Facility ([facility])                                      Prior Living Arrangements/Services Living arrangements for the past 2 months: Assisted Living Facility ([facility]) Lives with:: Facility Resident Patient language and need for interpreter reviewed:: Yes        Need for Family Participation in Patient Care: Yes (Comment) Care giver support system in place?: Yes (comment)   Criminal Activity/Legal Involvement Pertinent to Current Situation/Hospitalization: No - Comment as needed  Activities of Daily Living   ADL Screening (condition at time of admission) Independently performs ADLs?: No Does the  patient have a NEW difficulty with bathing/dressing/toileting/self-feeding that is expected to last >3 days?: No Does the patient have a NEW difficulty with getting in/out of bed, walking, or climbing stairs that is expected to last >3 days?: No Does the patient have a NEW difficulty with communication that is expected to last >3 days?: No Is the patient deaf or have difficulty hearing?: No Does the patient have difficulty seeing, even when wearing glasses/contacts?: No Does the patient have difficulty concentrating, remembering, or making decisions?: No  Permission Sought/Granted                  Emotional Assessment Appearance:: Appears stated age Attitude/Demeanor/Rapport: Engaged Affect (typically observed): Pleasant Orientation: : Oriented to Self, Oriented to Place      Admission diagnosis:  Trauma [T14.90XA] Patient Active Problem List   Diagnosis Date Noted   Trauma 11/30/2023   Influenza A 09/18/2023   Pressure injury of skin 09/18/2023   SIRS (systemic inflammatory response syndrome) (HCC) 09/17/2023   Aspiration pneumonia (HCC) 08/13/2023   Sepsis (HCC) 08/13/2023   Edema of lower extremity 12/25/2022   Chronic kidney disease due to hypertension 12/25/2022   Normocytic anemia 11/27/2022   Pancytopenia (HCC) 11/21/2022   Metabolic acidosis 11/21/2022   Overweight (BMI 25.0-29.9) 11/20/2022   Acute renal failure superimposed on stage 3a chronic kidney disease (HCC)  11/19/2022   Acute respiratory failure with hypoxia (HCC) 04/16/2022   Generalized weakness 04/16/2022   Chronic kidney disease, stage 3b (HCC) 04/16/2022   COVID-19 04/10/2022   Hypercholesterolemia 11/12/2021   Hyperglycemia due to type 2 diabetes mellitus (HCC) 11/12/2021   Closed compression fracture of L2 lumbar vertebra, initial encounter (HCC) 08/18/2021   Alzheimer's dementia (HCC) 08/18/2021   Acute lower UTI leading to weakness and dehydration 08/18/2021   Laceration of head 08/18/2021    Hypoglycemia 06/29/2021   Severe sepsis (HCC) 05/04/2021   CAP (community acquired pneumonia) 05/02/2021   Vitamin D deficiency 03/10/2019   Depressed mood 03/10/2019   CKD (chronic kidney disease) stage 3, GFR 30-59 ml/min (HCC) 11/14/2017   Goals of care, counseling/discussion 05/27/2017   Pancytopenia due to antineoplastic chemotherapy (HCC) 11/17/2016   Chemotherapy-induced neutropenia (HCC) 09/22/2016   Urinary incontinence 08/02/2016   Systolic murmur 05/28/2016   Myelodysplastic syndrome, low grade (HCC) 02/05/2016   Multiple myeloma not having achieved remission (HCC) 02/05/2016   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   Myelodysplasia present in bone marrow (HCC) 11/03/2015   Advanced care planning/counseling discussion 05/22/2015   Insomnia 03/03/2015   Monoclonal gammopathy 02/03/2015   Macrocytic anemia 02/03/2015   Obesity (BMI 30-39.9) 10/12/2014   History of nonmelanoma skin cancer 10/10/2014   Medicare annual wellness visit, subsequent 04/12/2014   Health maintenance examination 04/12/2014   Hairy cell leukemia, in remission (HCC)    B12 deficiency    BPH (benign prostatic hyperplasia)    Essential hypertension    GERD (gastroesophageal reflux disease)    Dyslipidemia    Type 2 diabetes mellitus with stage 3 chronic kidney disease (HCC)    Diverticulosis    Macular hole 07/07/2012   PCP:  [facility]:   [facility] [address] [place] [address] Phone: [phone] Fax: [phone]     Social Drivers of Health (SDOH) Social History: SDOH Screenings   Food Insecurity: No Food Insecurity (11/30/2023)  Housing: Low Risk  (11/30/2023)  Transportation Needs: No Transportation Needs (11/30/2023)  Utilities: Not At Risk (11/30/2023)  Depression (PHQ2-9): Medium Risk (03/09/2019)  Financial Resource Strain: Low Risk  (11/17/2023)   Received from [facility]  Social Connections: Socially Integrated (11/30/2023)  Tobacco Use:  Medium Risk (12/01/2023)   SDOH Interventions:     Readmission Risk Interventions    04/13/2022    2:31 PM 08/19/2021   10:21 AM 05/11/2021   10:42 AM  Readmission Risk Prevention Plan  Transportation Screening Complete Complete Complete  PCP or Specialist Appt within 3-5 Days Complete    HRI or Home Care Consult Complete    Social Work Consult for Recovery Care Planning/Counseling Complete    Palliative Care Screening Complete    Medication Review [facility]) Complete Complete Complete  PCP or Specialist appointment within 3-5 days of discharge  Complete Complete  HRI or Home Care Consult  Complete Complete  SW Recovery Care/Counseling Consult  Complete Complete  Palliative Care Screening  Not Applicable Complete  Skilled Nursing Facility  Complete Complete

## 2023-12-02 NOTE — Progress Notes (Signed)
 Mobility Specialist Progress Note:    12/02/23 1000  Mobility  Activity Transferred from bed to chair  Level of Assistance Minimal assist, patient does 75% or more  Assistive Device Front wheel walker  Distance Ambulated (ft) 4 ft  Activity Response Tolerated well  Mobility Referral Yes  Mobility visit 1 Mobility  Mobility Specialist Start Time (ACUTE ONLY) [id number]  Mobility Specialist Stop Time (ACUTE ONLY) [id number]  Mobility Specialist Time Calculation (min) (ACUTE ONLY) 15 min   Pt received in bed and agreeable. Required minA to come EOB. C/o some L flank pain that subsided w/ increased time. Able to stand w/ minA and required cues for direction and walker management. Pt left in chair with call bell and all needs met. Chair alarm on.  D'Vante Nolon Baxter Mobility Specialist Please contact via Special educational needs teacher or Rehab office at [phone]

## 2023-12-02 NOTE — Progress Notes (Signed)
 Physical Therapy Treatment Patient Details Name: Alex Smith MRN: [id number] DOB: 1934/09/14 Today's Date: 12/02/2023   History of Present Illness Pt is an 89 y/o male presenting after a fall at his ALF resulting in L sided rib fractures (3-9), extrapleural hematoma near rib fx and L pleural effusion. PMHx: myelodysplasia, Alzheimer's dementia, hypertension, BPH, CKD stage IIIa, insulin-dependent diabetes.    PT Comments  Pt received up in chair and agreeable to participate. Pt maintaining level of functional mobility. Requiring min assist for transfers and ambulating 50 ft with a walker and moderate assist for balance. SpO2 94% on 3L O2. Pt displays generalized weakness, gait abnormalities, postural abnormalities, and impaired balance in comparison to his baseline. Will benefit from continued inpatient follow up therapy, <3 hours/day to address.      If plan is discharge home, recommend the following: Assistance with cooking/housework;A lot of help with bathing/dressing/bathroom;Direct supervision/assist for medications management;Direct supervision/assist for financial management;Assist for transportation;Supervision due to cognitive status;Help with stairs or ramp for entrance;A lot of help with walking and/or transfers   Can travel by private vehicle     Yes  Equipment Recommendations  None recommended by PT    Recommendations for Other Services       Precautions / Restrictions Precautions Precautions: Fall;Other (comment) Recall of Precautions/Restrictions: Impaired Precaution/Restrictions Comments: monitor O2 (does not wear at baseline) Required Braces or Orthoses: Other Brace Other Brace: L post op shoe Restrictions Weight Bearing Restrictions Per Provider Order: No     Mobility  Bed Mobility Overal bed mobility: Needs Assistance Bed Mobility: Sit to Supine     Supine to sit: Mod assist     General bed mobility comments: Assist for LE's back into bed     Transfers Overall transfer level: Needs assistance Equipment used: Rolling walker (2 wheels) Transfers: Sit to/from Stand Sit to Stand: Min assist           General transfer comment: MinA to boost up to standing position from chair    Ambulation/Gait Ambulation/Gait assistance: Mod assist, +2 safety/equipment Gait Distance (Feet): 50 Feet Assistive device: Rolling walker (2 wheels) Gait Pattern/deviations: Step-through pattern, Decreased stride length, Trunk flexed Gait velocity: Decreased Gait velocity interpretation: <1.31 ft/sec, indicative of household ambulator   General Gait Details: Verbal cues for closer walker proximity, forward gaze, upright posture. ModA for balance, family member assisting with IV pole   Stairs             Wheelchair Mobility     Tilt Bed    Modified Rankin (Stroke Patients Only)       Balance Overall balance assessment: Needs assistance, History of Falls Sitting-balance support: No upper extremity supported, Feet supported Sitting balance-Leahy Scale: Fair     Standing balance support: Bilateral upper extremity supported, During functional activity Standing balance-Leahy Scale: Poor Standing balance comment: reliant on at least one UE support                            Communication Communication Communication: No apparent difficulties  Cognition Arousal: Alert Behavior During Therapy: WFL for tasks assessed/performed   PT - Cognitive impairments: History of cognitive impairments                         Following commands: Impaired Following commands impaired: Follows one step commands with increased time, Only follows one step commands consistently    Cueing Cueing Techniques: Verbal cues, Gestural  cues, Tactile cues  Exercises      General Comments        Pertinent Vitals/Pain Pain Assessment Pain Assessment: Faces Faces Pain Scale: Hurts little more Pain Location: L side/ribs Pain  Descriptors / Indicators: Sore Pain Intervention(s): Limited activity within patient's tolerance, Monitored during session    Home Living                          Prior Function            PT Goals (current goals can now be found in the care plan section) Acute Rehab PT Goals Patient Stated Goal: SNF then back to ALF PT Goal Formulation: With patient/family Time For Goal Achievement: 12/15/23 Potential to Achieve Goals: Good Progress towards PT goals: Progressing toward goals    Frequency    Min 2X/week      PT Plan      Co-evaluation              AM-PAC PT "6 Clicks" Mobility   Outcome Measure  Help needed turning from your back to your side while in a flat bed without using bedrails?: A Little Help needed moving from lying on your back to sitting on the side of a flat bed without using bedrails?: A Lot Help needed moving to and from a bed to a chair (including a wheelchair)?: A Little Help needed standing up from a chair using your arms (e.g., wheelchair or bedside chair)?: A Little Help needed to walk in hospital room?: A Little Help needed climbing 3-5 steps with a railing? : Total 6 Click Score: 15    End of Session Equipment Utilized During Treatment: Gait belt;Oxygen Activity Tolerance: Patient tolerated treatment well Patient left: with call bell/phone within reach;in bed;with bed alarm set Nurse Communication: Mobility status PT Visit Diagnosis: Unsteadiness on feet (R26.81);Pain;Difficulty in walking, not elsewhere classified (R26.2) Pain - Right/Left: Left Pain - part of body:  (ribs)     Time: 1301-1330 PT Time Calculation (min) (ACUTE ONLY): 29 min  Charges:    $Gait Training: 8-22 mins $Therapeutic Activity: 8-22 mins PT General Charges $$ ACUTE PT VISIT: 1 Visit                     Verdia Glad, PT, DPT Acute Rehabilitation Services Office [phone]    Claria Crofts 12/02/2023, 2:21 PM

## 2023-12-02 NOTE — [place] FL2 (Signed)
 [place] MEDICAID FL2 LEVEL OF CARE FORM     IDENTIFICATION  Patient Name: Alex Smith Birthdate: November 02, 1934 Sex: male Admission Date (Current Location): 11/30/2023  [facility] and [place] Number:  Producer, television/film/video and Address:  [facility], [address] [place], [place] [address]      Provider Number: [id number]  Attending Physician Name and Address:  Md, Trauma, MD  Relative Name and Phone Number:  [place], [place]. Son   [phone]    Current Level of Care: Hospital Recommended Level of Care: Skilled Nursing Facility Prior Approval Number:    Date Approved/Denied:   PASRR Number: [id number]  Discharge Plan: SNF    Current Diagnoses: Patient Active Problem List   Diagnosis Date Noted   Trauma 11/30/2023   Influenza A 09/18/2023   Pressure injury of skin 09/18/2023   SIRS (systemic inflammatory response syndrome) (HCC) 09/17/2023   Aspiration pneumonia (HCC) 08/13/2023   Sepsis (HCC) 08/13/2023   Edema of lower extremity 12/25/2022   Chronic kidney disease due to hypertension 12/25/2022   Normocytic anemia 11/27/2022   Pancytopenia (HCC) 11/21/2022   Metabolic acidosis 11/21/2022   Overweight (BMI 25.0-29.9) 11/20/2022   Acute renal failure superimposed on stage 3a chronic kidney disease (HCC) 11/19/2022   Acute respiratory failure with hypoxia (HCC) 04/16/2022   Generalized weakness 04/16/2022   Chronic kidney disease, stage 3b (HCC) 04/16/2022   COVID-19 04/10/2022   Hypercholesterolemia 11/12/2021   Hyperglycemia due to type 2 diabetes mellitus (HCC) 11/12/2021   Closed compression fracture of L2 lumbar vertebra, initial encounter (HCC) 08/18/2021   Alzheimer's dementia (HCC) 08/18/2021   Acute lower UTI leading to weakness and dehydration 08/18/2021   Laceration of head 08/18/2021   Hypoglycemia 06/29/2021   Severe sepsis (HCC) 05/04/2021   CAP (community acquired pneumonia) 05/02/2021   Vitamin D deficiency 03/10/2019    Depressed mood 03/10/2019   CKD (chronic kidney disease) stage 3, GFR 30-59 ml/min (HCC) 11/14/2017   Goals of care, counseling/discussion 05/27/2017   Pancytopenia due to antineoplastic chemotherapy (HCC) 11/17/2016   Chemotherapy-induced neutropenia (HCC) 09/22/2016   Urinary incontinence 08/02/2016   Systolic murmur 05/28/2016   Myelodysplastic syndrome, low grade (HCC) 02/05/2016   Multiple myeloma not having achieved remission (HCC) 02/05/2016   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   Myelodysplasia present in bone marrow (HCC) 11/03/2015   Advanced care planning/counseling discussion 05/22/2015   Insomnia 03/03/2015   Monoclonal gammopathy 02/03/2015   Macrocytic anemia 02/03/2015   Obesity (BMI 30-39.9) 10/12/2014   History of nonmelanoma skin cancer 10/10/2014   Medicare annual wellness visit, subsequent 04/12/2014   Health maintenance examination 04/12/2014   Hairy cell leukemia, in remission (HCC)    B12 deficiency    BPH (benign prostatic hyperplasia)    Essential hypertension    GERD (gastroesophageal reflux disease)    Dyslipidemia    Type 2 diabetes mellitus with stage 3 chronic kidney disease (HCC)    Diverticulosis    Macular hole 07/07/2012    Orientation RESPIRATION BLADDER Height & Weight     Self, Place  O2 Continent Weight:   Height:  5\' 7"  (170.2 cm)  BEHAVIORAL SYMPTOMS/MOOD NEUROLOGICAL BOWEL NUTRITION STATUS      Continent Diet (see discharge summary)  AMBULATORY STATUS COMMUNICATION OF NEEDS Skin   Limited Assist Verbally Skin abrasions, Other (Comment) (ecchymosis)                       Personal Care Assistance Level of Assistance  Bathing, Feeding, Dressing Bathing Assistance: Limited assistance Feeding assistance: Limited assistance Dressing Assistance: Limited assistance     Functional Limitations Info  Sight, Hearing, Speech Sight Info: Adequate Hearing Info: Adequate Speech Info: Adequate    SPECIAL CARE FACTORS FREQUENCY  PT (By  licensed PT), OT (By licensed OT)     PT Frequency: 5x week OT Frequency: 5x week            Contractures Contractures Info: Not present    Additional Factors Info  Code Status, Allergies, Insulin Sliding Scale Code Status Info: full Allergies Info: Rituxan (Rituximab), Blood-group Specific Substance, Primaxin Iv (Imipenem-cilastatin), Primaxin (Imipenem), Vfend (Voriconazole), Sulfa Antibiotics, Sulfacetamide Sodium   Insulin Sliding Scale Info: Novolog: see discharge summary       Current Medications (12/02/2023):  This is the current hospital active medication list Current Facility-Administered Medications  Medication Dose Route Frequency Provider Last Rate Last Admin   0.9 %  sodium chloride infusion   Intravenous Continuous Annetta Killian, PA-C       acetaminophen (TYLENOL) tablet 1,000 mg  1,000 mg Oral Q6H Cannon Champion, MD   1,000 mg at 12/02/23 0041   albuterol (PROVENTIL) (2.5 MG/3ML) 0.083% nebulizer solution 3 mL  3 mL Inhalation Q6H PRN Cannon Champion, MD       amLODipine (NORVASC) tablet 10 mg  10 mg Oral Daily Cannon Champion, MD   10 mg at 12/01/23 4098   aspirin EC tablet 81 mg  81 mg Oral Daily Cannon Champion, MD   81 mg at 12/01/23 0840   cyanocobalamin (VITAMIN B12) tablet 1,000 mcg  1,000 mcg Oral Daily Annetta Killian, PA-C       docusate sodium (COLACE) capsule 100 mg  100 mg Oral BID Metzger, Shivank Pinedo A, MD   100 mg at 12/01/23 2148   donepezil (ARICEPT) tablet 5 mg  5 mg Oral Daily Cannon Champion, MD   5 mg at 12/01/23 0840   famotidine (PEPCID) tablet 20 mg  20 mg Oral Daily Cannon Champion, MD   20 mg at 12/01/23 0840   gabapentin (NEURONTIN) capsule 300 mg  300 mg Oral TID Cannon Champion, MD   300 mg at 12/01/23 2148   hydrALAZINE (APRESOLINE) injection 10 mg  10 mg Intravenous Q2H PRN Cannon Champion, MD       HYDROmorphone (DILAUDID) injection 1 mg  1 mg Intravenous Q2H PRN Metzger, Ithzel Fedorchak A, MD   1 mg at 12/02/23 0741    insulin aspart (novoLOG) injection 0-15 Units  0-15 Units Subcutaneous TID WC Cannon Champion, MD   5 Units at 12/02/23 0741   insulin aspart (novoLOG) injection 0-5 Units  0-5 Units Subcutaneous QHS Cannon Champion, MD   3 Units at 11/30/23 2316   insulin aspart (novoLOG) injection 2 Units  2 Units Subcutaneous TID WC Johnson, Kelly R, PA-C       insulin glargine-yfgn [facility]) injection 8 Units  8 Units Subcutaneous QHS [place] Breath R, PA-C       lidocaine (LIDODERM) 5 % 2 patch  2 patch Transdermal Q24H Annetta Killian, PA-C       losartan (COZAAR) tablet 25 mg  25 mg Oral Daily Annetta Killian, PA-C   25 mg at 12/01/23 1720   melatonin tablet 10 mg  10 mg Oral QHS [place] Breath R, PA-C   10 mg at 12/01/23 2148   methocarbamol (ROBAXIN) tablet 1,000 mg  1,000 mg Oral Q8H  [place] Breath R, PA-C       metoprolol tartrate (LOPRESSOR) injection 5 mg  5 mg Intravenous Q6H PRN Metzger, Kiera Hussey A, MD       multivitamin with minerals tablet 1 tablet  1 tablet Oral Daily Annetta Killian, PA-C       ondansetron (ZOFRAN-ODT) disintegrating tablet 4 mg  4 mg Oral Q6H PRN Cannon Champion, MD       Or   ondansetron (ZOFRAN) injection 4 mg  4 mg Intravenous Q6H PRN Cannon Champion, MD       Oral care mouth rinse  15 mL Mouth Rinse PRN Stechschulte, Avon Boers, MD       oxyCODONE (Oxy IR/ROXICODONE) immediate release tablet 2.5-5 mg  2.5-5 mg Oral Q4H PRN Johnson, Kelly R, PA-C   5 mg at 12/02/23 0305   pantoprazole (PROTONIX) EC tablet 40 mg  40 mg Oral BID Johnson, Kelly R, PA-C   40 mg at 12/01/23 2148   polyethylene glycol (MIRALAX / GLYCOLAX) packet 17 g  17 g Oral Daily PRN Cannon Champion, MD       pravastatin (PRAVACHOL) tablet 20 mg  20 mg Oral Daily Cannon Champion, MD   20 mg at 12/01/23 0840   sertraline (ZOLOFT) tablet 25 mg  25 mg Oral Daily Cannon Champion, MD   25 mg at 12/01/23 0840   tamsulosin (FLOMAX) capsule 0.4 mg  0.4 mg Oral QHS Annetta Killian, PA-C   0.4 mg  at 12/01/23 2148   torsemide (DEMADEX) tablet 20 mg  20 mg Oral Daily Johnson, Kelly R, PA-C   20 mg at 12/01/23 1720   vitamin E capsule 400 Units  400 Units Oral Daily Johnson, Kelly R, PA-C       Facility-Administered Medications Ordered in Other Encounters  Medication Dose Route Frequency Provider Last Rate Last Admin   heparin lock flush 100 unit/mL  500 Units Intravenous Once Corcoran, Melissa C, MD       luspatercept -aamt (REBLOZYL ) subcutaneous injection 100 mg  100 mg Subcutaneous Q21 days Rao, Archana C, MD   100 mg at 01/09/23 1428   sodium chloride 0.9 % injection 10 mL  10 mL Intravenous PRN Corcoran, Melissa C, MD   10 mL at 03/03/15 0903   sodium chloride 0.9 % injection 10 mL  10 mL Intracatheter PRN Corcoran, Melissa C, MD   10 mL at 03/10/15 1410   sodium chloride flush (NS) 0.9 % injection 10 mL  10 mL Intravenous PRN Corcoran, Melissa C, MD   10 mL at 07/23/18 1517     Discharge Medications: Please see discharge summary for a list of discharge medications.  Relevant Imaging Results:  Relevant Lab Results:   Additional Information SS#: [id number]  Elspeth Hals, LCSW

## 2023-12-02 NOTE — Inpatient Diabetes Management (Signed)
 Inpatient Diabetes Program Recommendations  AACE/ADA: New Consensus Statement on Inpatient Glycemic Control (2015)  Target Ranges:  Prepandial:   less than 140 mg/dL      Peak postprandial:   less than 180 mg/dL (1-2 hours)      Critically ill patients:  140 - 180 mg/dL    Latest Reference Range & Units 12/01/23 08:48 12/01/23 11:04 12/01/23 16:52 12/01/23 21:31 12/02/23 07:13  Glucose-Capillary 70 - 99 mg/dL 272 (H) 536 (H) 644 (H) 140 (H) 249 (H)   Admit with: Fall with Multiple left sided rib fractures/ Extrapleural hematoma near rib fractures/ Left pleural effusion  History: DM, Dementia, CKD  Home DM Meds: Lantus 14 units at bedtime       Humalog 2 units TID with meals  Current Orders: Novolog Moderate Correction Scale/ SSI (0-15 units) TID AC + HS   MD- Note pt takes Lantus as outpatient  -   Consider starting Semglee 8 units at bedtime -   start Novolog 2 units tid meal coverage  --Will follow patient during hospitalization--  Eloise Hake RN, MSN, BC-ADM Inpatient Diabetes Coordinator Team Pager [phone] (8a-5p)

## 2023-12-02 NOTE — Plan of Care (Signed)

## 2023-12-03 ENCOUNTER — Inpatient Hospital Stay

## 2023-12-03 ENCOUNTER — Inpatient Hospital Stay: Payer: PPO

## 2023-12-03 LAB — CBC
HCT: 23.6 % — ABNORMAL LOW (ref 39.0–52.0)
Hemoglobin: 7.6 g/dL — ABNORMAL LOW (ref 13.0–17.0)
MCH: 34.7 pg — ABNORMAL HIGH (ref 26.0–34.0)
MCHC: 32.2 g/dL (ref 30.0–36.0)
MCV: 107.8 fL — ABNORMAL HIGH (ref 80.0–100.0)
Platelets: 92 10*3/uL — ABNORMAL LOW (ref 150–400)
RBC: 2.19 MIL/uL — ABNORMAL LOW (ref 4.22–5.81)
RDW: 16.9 % — ABNORMAL HIGH (ref 11.5–15.5)
WBC: 5.5 10*3/uL (ref 4.0–10.5)
nRBC: 0.4 % — ABNORMAL HIGH (ref 0.0–0.2)

## 2023-12-03 LAB — GLUCOSE, CAPILLARY
Glucose-Capillary: 317 mg/dL — ABNORMAL HIGH (ref 70–99)
Glucose-Capillary: 302 mg/dL — ABNORMAL HIGH (ref 70–99)
Glucose-Capillary: 126 mg/dL — ABNORMAL HIGH (ref 70–99)
Glucose-Capillary: 107 mg/dL — ABNORMAL HIGH (ref 70–99)

## 2023-12-03 LAB — BASIC METABOLIC PANEL WITH GFR
Anion gap: 8 (ref 5–15)
BUN: 48 mg/dL — ABNORMAL HIGH (ref 8–23)
CO2: 24 mmol/L (ref 22–32)
Calcium: 8 mg/dL — ABNORMAL LOW (ref 8.9–10.3)
Chloride: 100 mmol/L (ref 98–111)
Creatinine, Ser: 2.48 mg/dL — ABNORMAL HIGH (ref 0.61–1.24)
GFR, Estimated: 24 mL/min — ABNORMAL LOW (ref >60)
Glucose, Bld: 309 mg/dL — ABNORMAL HIGH (ref 70–99)
Potassium: 4.4 mmol/L (ref 3.5–5.1)
Sodium: 132 mmol/L — ABNORMAL LOW (ref 135–145)

## 2023-12-03 MED ORDER — SODIUM CHLORIDE 0.9 % IV SOLN
INTRAVENOUS | Status: AC
Start: 2023-12-03 — End: 2023-12-05

## 2023-12-03 MED ORDER — HYDROMORPHONE HCL 1 MG/ML IJ SOLN: 0.5000 mg | INTRAMUSCULAR | Status: DC | PRN

## 2023-12-03 MED ORDER — INSULIN GLARGINE-YFGN 100 UNIT/ML ~~LOC~~ SOLN
12.0000 [IU] | Freq: Every day | SUBCUTANEOUS | Status: DC
  Administered 2023-12-03 – 2023-12-07 (×5): 12 [IU] via SUBCUTANEOUS
  Filled 2023-12-03 (×6): qty 0.12

## 2023-12-03 MED ORDER — INSULIN ASPART 100 UNIT/ML IJ SOLN
5.0000 [IU] | Freq: Three times a day (TID) | INTRAMUSCULAR | Status: DC
  Administered 2023-12-03 – 2023-12-08 (×10): 5 [IU] via SUBCUTANEOUS

## 2023-12-03 MED ORDER — METHOCARBAMOL 500 MG PO TABS
500.0000 mg | ORAL_TABLET | Freq: Three times a day (TID) | ORAL | Status: DC
Start: 2023-12-03 — End: 2023-12-04
  Administered 2023-12-03 – 2023-12-04 (×2): 500 mg via ORAL
  Filled 2023-12-03 (×3): qty 1

## 2023-12-03 NOTE — Inpatient Diabetes Management (Signed)
 Inpatient Diabetes Program Recommendations  AACE/ADA: New Consensus Statement on Inpatient Glycemic Control   Target Ranges:  Prepandial:   less than 140 mg/dL      Peak postprandial:   less than 180 mg/dL (1-2 hours)      Critically ill patients:  140 - 180 mg/dL    Latest Reference Range & Units 12/02/23 07:13 12/02/23 11:04 12/02/23 16:05 12/02/23 21:33 12/03/23 06:19  Glucose-Capillary 70 - 99 mg/dL 161 (H) 096 (H) 045 (H) 264 (H) 317 (H)   Review of Glycemic Control  Diabetes history: DM2 Outpatient Diabetes medications: Lantus 14 units at bedtime, Humalog 2 units TID with meals Current orders for Inpatient glycemic control: Semglee 8 units at bedtime, Novolog 2 units TID with meals, Novolog 0-15 units TID with meals, Novolog 0-5 units QHS  Inpatient Diabetes Program Recommendations:    Insulin: CBG 317 mg/dl this am. Please consider increasing Semglee to 12 units at bedtime and increasing meal coverage to Novolog 5 units TID with meals.  Thanks, Beacher Limerick, RN, MSN, CDCES Diabetes Coordinator Inpatient Diabetes Program [phone] (Team Pager from 8am to 5pm)

## 2023-12-03 NOTE — TOC Progression Note (Signed)
 Transition of Care [facility]) - Progression Note    Patient Details  Name: Alex Smith MRN: [id number] Date of Birth: [birth year]  Transition of [facility]) CM/SW Contact  Elspeth Hals, LCSW Phone Number: 12/03/2023, 10:37 AM  Clinical Narrative:   TC Tammy/HTA: SNF auth approved for 7 days: 121050.  PTAR not requested, per PT note.     Expected Discharge Plan: Skilled Nursing Facility Barriers to Discharge: Continued Medical Work up, SNF Pending bed offer  Expected Discharge Plan and Services     Post Acute Care Choice: Skilled Nursing Facility Living arrangements for the past 2 months: Assisted Living Facility ([facility]                                       Social Determinants of Health (SDOH) Interventions SDOH Screenings   Food Insecurity: No Food Insecurity (11/30/2023)  Housing: Low Risk  (11/30/2023)  Transportation Needs: No Transportation Needs (11/30/2023)  Utilities: Not At Risk (11/30/2023)  Depression (PHQ2-9): Medium Risk (03/09/2019)  Financial Resource Strain: Low Risk  (11/17/2023)   Received from [facility]  Social Connections: Socially Integrated (11/30/2023)  Tobacco Use: Medium Risk (12/01/2023)    Readmission Risk Interventions    04/13/2022    2:31 PM 08/19/2021   10:21 AM 05/11/2021   10:42 AM  Readmission Risk Prevention Plan  Transportation Screening Complete Complete Complete  PCP or Specialist Appt within 3-5 Days Complete    HRI or Home Care Consult Complete    Social Work Consult for Recovery Care Planning/Counseling Complete    Palliative Care Screening Complete    Medication Review [employer]) Complete Complete Complete  PCP or Specialist appointment within 3-5 days of discharge  Complete Complete  HRI or Home Care Consult  Complete Complete  SW Recovery Care/Counseling Consult  Complete Complete  Palliative Care Screening  Not Applicable Complete  Skilled Nursing Facility  Complete Complete

## 2023-12-03 NOTE — Progress Notes (Signed)
 Progress Note     Subjective: Sleeping this AM, son at bedside. Per son patient seemed more confused shortly after muscle relaxant this AM and had a hard time trying to get up with therapies.   Objective: Vital signs in last 24 hours: Temp:  [97.7 F (36.5 C)-98.4 F (36.9 C)] 97.7 F (36.5 C) (04/16 0807) Pulse Rate:  [66-87] 85 (04/16 0807) Resp:  [16-18] 18 (04/16 0807) BP: (125-132)/(49-76) 132/76 (04/16 0807) SpO2:  [92 %-95 %] 94 % (04/16 0807) Last BM Date :  (UTA)  Intake/Output from previous day: 04/15 0701 - 04/16 0700 In: 199.6 [I.V.:199.6] Out: -  Intake/Output this shift: No intake/output data recorded.  PE: General: pleasant, WD, elderly male, NAD HEENT: head is normocephalic, atraumatic. EOMI Heart: regular, rate, and rhythm. Palpable radial and pedal pulses bilaterally Lungs: CTAB, no wheezes, rhonchi, or rales noted.  Respiratory effort nonlabored on supplemental O2 Abd: soft, NT, ND     Lab Results:  Recent Labs    12/02/23 0435 12/03/23 0622  WBC 5.3 5.5  HGB 7.8* 7.6*  HCT 24.9* 23.6*  PLT 79* 92*   BMET Recent Labs    12/02/23 0435 12/03/23 0622  NA 136 132*  K 4.3 4.4  CL 103 100  CO2 26 24  GLUCOSE 225* 309*  BUN 38* 48*  CREATININE 2.20* 2.48*  CALCIUM 8.2* 8.0*   PT/INR No results for input(s): "LABPROT", "INR" in the last 72 hours. CMP     Component Value Date/Time   NA 132 (L) 12/03/2023 0622   K 4.4 12/03/2023 0622   K 4.5 09/19/2014 1511   CL 100 12/03/2023 0622   CO2 24 12/03/2023 0622   GLUCOSE 309 (H) 12/03/2023 0622   BUN 48 (H) 12/03/2023 0622   CREATININE 2.48 (H) 12/03/2023 0622   CREATININE 1.52 (H) 09/03/2023 1020   CREATININE 1.45 (H) 11/14/2017 1647   CALCIUM 8.0 (L) 12/03/2023 0622   PROT 7.5 11/05/2023 1013   PROT 7.5 01/10/2012 1539   ALBUMIN 3.7 11/05/2023 1013   ALBUMIN 4.0 01/10/2012 1539   AST 17 11/05/2023 1013   AST 16 09/03/2023 1020   ALT 14 11/05/2023 1013   ALT 13 09/03/2023 1020    ALT 29 01/10/2012 1539   ALKPHOS 79 11/05/2023 1013   ALKPHOS 53 08/20/2013 0000   BILITOT 1.0 11/05/2023 1013   BILITOT 0.7 09/03/2023 1020   GFRNONAA 24 (L) 12/03/2023 0622   GFRNONAA 44 (L) 09/03/2023 1020   GFRNONAA 58 (L) 12/14/2014 1109   GFRAA 45 (L) 05/05/2020 1314   GFRAA >60 12/14/2014 1109   Lipase     Component Value Date/Time   LIPASE 19 08/13/2023 0614       Studies/Results: No results found.   Anti-infectives: Anti-infectives (From admission, onward)    None        Assessment/Plan  Fall from standing  L 3-9 rib fractures with L pleural effusion - multimodal pain control, IS, pulm toilet, CXR stable 4/14 Extrapleural hematoma - hgb 7.6, plts low, hold LMWH Chronic Thrombocytopenia - plts 79K, hold LMWH T2DM - SSI, increased semglee 12 u at bedtime and 5u novolog for meal coverage per DM coordinator recs Chronic L 2nd toe infection - wound care ordered, f/u outpatient with podiatry AKI on CKD stage III - Cr 2.4 today from 1.86, continue MIVF. Send UA and check bladder scan post-void to ensure not retaining. Discontinued gabapentin, losartan and torsemide as well. Baseline appears around 1.4 HTN HLD BPH Dementia  FEN: CM diet, NS @50  cc/h VTE: hold LMWH ID: no current abx  Dispo: 5N. SNF once medically cleared. Continue IVF and discontinued nephrotoxic meds    LOS: 3 days   I reviewed last 24 h vitals and pain scores, last 48 h intake and output, last 24 h labs and trends, and last 24 h imaging results.  This care required moderate level of medical decision making.    Annetta Killian, [facility] Surgery 12/03/2023, 11:34 AM Please see Amion for pager number during day hours 7:00am-4:30pm

## 2023-12-03 NOTE — Discharge Summary (Signed)
 Physician Discharge Summary  Patient ID: Alex Smith MRN: [id number] DOB/AGE: [birth year] 89 y.o.  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 He reported that he thought he tripped over something and initially did not want to be evaluated but then complained of left chest pain. Pain worse with inspiration. Found to have left rib fractures with extrapleural hematoma. He was admitted to the trauma service for pain control and therapies.   Hospital Course: PT/OT evaluated and recommended SNF for rehab. His hospitalization was complicated by AKI on CKD, seen by neprho, torsemide  and losartan  held and Cr was trending toward baseline upon discharge. On 12/09/23 patient was tolerating a diet, voiding appropriately, VSS, pain reasonably well controlled and overall felt stable for discharge to SNF. Follow up with PCP and primary nephrologist Dr. Rica Chalet recommended.   I or a member of my team have reviewed this patient in the Controlled Substance Database  PE: Gen:  Alert, NAD, pleasant Card:  Reg Pulm:  CTAB, no W/R/R, effort normal Abd: Soft, ND, NT, +BS Ext:  No LE edema.  Psych: A&Ox3     Allergies as of 12/09/2023       Reactions   Rituxan [rituximab] Rash, Other (See Comments)   Chest tightness   Blood-group Specific Substance Other (See Comments)   Had a post transfusion reaction of red blood cells; NOW REQUIRES WASHED BLOOD CELLS   Primaxin Iv [imipenem-cilastatin] Other (See Comments)   Unknown reaction   Primaxin [imipenem] Other (See Comments)   Unknown  reaction Tolerates cephalosporins   Vfend [voriconazole] Other (See Comments)   Unknown reaction   Sulfa Antibiotics Itching, Rash   Sulfacetamide Sodium Itching, Rash        Medication List     PAUSE taking these medications    losartan  25 MG tablet Wait to take this until your doctor or other care provider tells you to start again. Commonly known as: Cozaar  Take 1 tablet (25 mg total) by mouth daily.   torsemide  20 MG tablet Wait to take this until your doctor or other care provider tells you to start again. Commonly known as: DEMADEX  Take 20 mg by mouth daily.       TAKE these medications    acetaminophen  500 MG tablet Commonly known as: TYLENOL  Take 2 tablets (1,000 mg total) by mouth 2 (two) times daily.   albuterol  108 (90 Base) MCG/ACT inhaler Commonly known as: VENTOLIN  HFA Inhale 2 puffs into the lungs every 6 (six) hours as needed for wheezing or shortness of breath.   alum & mag hydroxide-simeth 200-200-20 MG/5ML suspension Commonly known as: MAALOX/MYLANTA Take 30 mLs by mouth every 4 (four) hours as needed for indigestion or heartburn. What changed: when to take this   amLODipine  10 MG tablet Commonly known as: NORVASC  Take 1 tablet (10 mg total) by mouth daily.   amoxicillin-clavulanate 875-125 MG tablet Commonly known as: AUGMENTIN Take 1 tablet by mouth 2 (two) times daily.   aspirin  EC 81 MG tablet Take 81 mg by mouth daily.   cyanocobalamin  1000 MCG tablet Commonly known as: VITAMIN B12  Take 1 tablet (1,000 mcg total) by mouth daily.   donepezil  5 MG tablet Commonly known as: ARICEPT  Take 5 mg by mouth daily.   doxycycline 100 MG tablet Commonly known as: ADOXA Take 100 mg by mouth 2 (two) times daily.   famotidine  20 MG tablet Commonly known as: PEPCID  Take 20 mg by mouth daily.   Geri-Tussin 100 MG/5ML liquid Generic drug: guaiFENesin  Take 15 mLs by mouth every 6 (six) hours as needed for cough.   insulin  glargine 100  UNIT/ML Solostar Pen Commonly known as: LANTUS  Inject 14 Units into the skin at bedtime. What changed: how much to take   insulin  lispro 100 UNIT/ML KwikPen Commonly known as: HUMALOG  Inject 2 Units into the skin 3 (three) times daily before meals. Per sliding scale What changed:  how much to take additional instructions   loperamide  2 MG tablet Commonly known as: IMODIUM  A-D Take 4 mg by mouth See admin instructions. Give 2 tablets (4mg ) by mouth after each loose stool as needed. Limit 8 tablets in 24 hours.   magnesium  hydroxide 400 MG/5ML suspension Commonly known as: MILK OF MAGNESIA Take 30 mLs by mouth daily as needed (constipation).   Melatonin 10 MG Tabs Take 10 mg by mouth at bedtime.   multivitamin with minerals Tabs tablet Take 1 tablet by mouth daily.   oxyCODONE  5 MG immediate release tablet Commonly known as: Oxy IR/ROXICODONE  Take 0.5-1 tablets (2.5-5 mg total) by mouth every 4 (four) hours as needed for moderate pain (pain score 4-6) or severe pain (pain score 7-10) (2.5 mg for moderate, 5 mg for severe).   pantoprazole  40 MG tablet Commonly known as: PROTONIX  Take 1 tablet (40 mg total) by mouth 2 (two) times daily.   pravastatin  20 MG tablet Commonly known as: PRAVACHOL  Take 20 mg by mouth at bedtime.   REBLOZYL  [place] Inject 100 mg into the skin See admin instructions. Infuse 100mg  into vein, every 3 weeks at oncology office.   sertraline  25 MG tablet Commonly known as: ZOLOFT  Take 25 mg by mouth daily.   tamsulosin  0.4 MG Caps capsule Commonly known as: FLOMAX  TAKE 1 CAPSULE BY MOUTH EVERY DAY What changed: when to take this   triamcinolone cream 0.1 % Commonly known as: KENALOG Apply 1 Application topically 2 (two) times daily. Apply liberally to bilateral lower legs.   UNABLE TO FIND Apply 1 application  topically daily. Remove dressing and cleanse the foot, toes and wound with Vashe or saline or distilled water. Put Betadine on gauze and place  on wound. Cover it with dry gauze then ABD. Place a dry 4x4 gauze on anterior ankle to protect it. Wrap with Kerlix then loose ACE. Secure with tape. Do this daily for the left 2nd toe.   vitamin E  180 MG (400 UNITS) capsule Take 400 Units by mouth daily.          Follow-up Information     [facility] Follow up.   Specialty: Geriatric Medicine Contact information: [address] [place] [address] [phone]         Rica Chalet, MD Follow up.   Specialty: Nephrology Why: For follow up Contact information: [address] [place] [address] [phone]                 Signed: Delton Filbert , [facility] 12/09/2023, 10:50 AM Please see Amion for pager number during day hours 7:00am-4:30pm

## 2023-12-03 NOTE — Progress Notes (Signed)
 Mobility Specialist Progress Note:    12/03/23 1000  Mobility  Activity Stood at bedside  Level of Assistance Maximum assist, patient does 25-49%  Assistive Device Front wheel walker  Activity Response Tolerated poorly  Mobility Referral Yes  Mobility visit 1 Mobility  Mobility Specialist Start Time (ACUTE ONLY) 0940  Mobility Specialist Stop Time (ACUTE ONLY) 1001  Mobility Specialist Time Calculation (min) (ACUTE ONLY) 21 min   Pt received in bed and agreeable. Very lethargic this date and having difficulty keeping eyes open. Required maxA to sit EOB and pt could not hold himself up. Attempted x1 standing trial w/ maxA. Able to clear bottom from bed but would have been unsafe to attempt transfer or gait trial. Pt returned to bed w/ maxA+2. Left in bed with call bell and bed alarm on. RN and family present throughout.Aaron Aas  D'Vante Nolon Baxter Mobility Specialist Please contact via Special educational needs teacher or Rehab office at [phone]

## 2023-12-04 LAB — BASIC METABOLIC PANEL WITH GFR
Anion gap: 10 (ref 5–15)
BUN: 56 mg/dL — ABNORMAL HIGH (ref 8–23)
CO2: 21 mmol/L — ABNORMAL LOW (ref 22–32)
Calcium: 8.5 mg/dL — ABNORMAL LOW (ref 8.9–10.3)
Chloride: 104 mmol/L (ref 98–111)
Creatinine, Ser: 2.68 mg/dL — ABNORMAL HIGH (ref 0.61–1.24)
GFR, Estimated: 22 mL/min — ABNORMAL LOW (ref >60)
Glucose, Bld: 197 mg/dL — ABNORMAL HIGH (ref 70–99)
Potassium: 4.8 mmol/L (ref 3.5–5.1)
Sodium: 135 mmol/L (ref 135–145)

## 2023-12-04 LAB — URINALYSIS, ROUTINE W REFLEX MICROSCOPIC
Bilirubin Urine: NEGATIVE
Glucose, UA: NEGATIVE mg/dL
Hgb urine dipstick: NEGATIVE
Ketones, ur: NEGATIVE mg/dL
Leukocytes,Ua: NEGATIVE
Nitrite: NEGATIVE
Protein, ur: 30 mg/dL — AB
Specific Gravity, Urine: 1.015 (ref 1.005–1.030)
pH: 5 (ref 5.0–8.0)

## 2023-12-04 LAB — GLUCOSE, CAPILLARY
Glucose-Capillary: 172 mg/dL — ABNORMAL HIGH (ref 70–99)
Glucose-Capillary: 173 mg/dL — ABNORMAL HIGH (ref 70–99)
Glucose-Capillary: 193 mg/dL — ABNORMAL HIGH (ref 70–99)
Glucose-Capillary: 148 mg/dL — ABNORMAL HIGH (ref 70–99)

## 2023-12-04 LAB — CBC
HCT: 23.6 % — ABNORMAL LOW (ref 39.0–52.0)
Hemoglobin: 7.6 g/dL — ABNORMAL LOW (ref 13.0–17.0)
MCH: 34.7 pg — ABNORMAL HIGH (ref 26.0–34.0)
MCHC: 32.2 g/dL (ref 30.0–36.0)
MCV: 107.8 fL — ABNORMAL HIGH (ref 80.0–100.0)
Platelets: 96 10*3/uL — ABNORMAL LOW (ref 150–400)
RBC: 2.19 MIL/uL — ABNORMAL LOW (ref 4.22–5.81)
RDW: 17 % — ABNORMAL HIGH (ref 11.5–15.5)
WBC: 5.2 10*3/uL (ref 4.0–10.5)
nRBC: 0.6 % — ABNORMAL HIGH (ref 0.0–0.2)

## 2023-12-04 MED ORDER — MELATONIN 5 MG PO TABS
5.0000 mg | ORAL_TABLET | Freq: Every day | ORAL | Status: DC
  Administered 2023-12-04 – 2023-12-08 (×5): 5 mg via ORAL
  Filled 2023-12-04 (×5): qty 1

## 2023-12-04 NOTE — Plan of Care (Signed)

## 2023-12-04 NOTE — Progress Notes (Signed)
 Physical Therapy Treatment Patient Details Name: Alex Smith MRN: [id number] DOB: 07-16-1935 Today's Date: 12/04/2023   History of Present Illness Pt is an 89 y/o male presenting after a fall at his ALF resulting in L sided rib fractures (3-9), extrapleural hematoma near rib fx and L pleural effusion. PMHx: myelodysplasia, Alzheimer's dementia, hypertension, BPH, CKD stage IIIa, insulin-dependent diabetes.    PT Comments  Pt more lethargic this am.  Only oriented to self intially but has periods where he is more lucid.  He was asking for his late wife.  He presents with new tremor, orthostasis, generalized pain and decrease in functional mobility.  PTA informed RN and charge RN of his decline in function.  Pt continues to benefit from skilled rehab in a post acute setting to maximize functional gains before returning home. HR at rest 88 bpm, during sitting edge of bed elevated to 120s.  Glucose level 173.    Orthostatic BPs: 129/55 supine 106/54 sitting 132/56 after return to supine.      If plan is discharge home, recommend the following: Assistance with cooking/housework;A lot of help with bathing/dressing/bathroom;Direct supervision/assist for medications management;Direct supervision/assist for financial management;Assist for transportation;Supervision due to cognitive status;Help with stairs or ramp for entrance;A lot of help with walking and/or transfers   Can travel by private vehicle        Equipment Recommendations  None recommended by PT    Recommendations for Other Services       Precautions / Restrictions Precautions Precautions: Fall;Other (comment) Recall of Precautions/Restrictions: Impaired Precaution/Restrictions Comments: monitor O2 (does not wear at baseline) Required Braces or Orthoses: Other Brace Other Brace: L post op shoe Restrictions Weight Bearing Restrictions Per Provider Order: No Other Position/Activity Restrictions: but has been wearing a post op  shoe to L foot to protect infected toe     Mobility  Bed Mobility Overal bed mobility: Needs Assistance Bed Mobility: Supine to Sit, Sit to Supine, Rolling, Sidelying to Sit Rolling: Max assist Sidelying to sit: Max assist, Total assist Supine to sit: Total assist, Max assist Sit to supine: Max assist, +2 for physical assistance   General bed mobility comments: Pt required increased assistance to rise into sitting on edge of this session.  Pt performed supine to sit with assessement of orthostatics and increased heart rate note as well as systolic drop in BP from supine to sit.  Performed additional supine to sit transfer and able to get feet to edge of bed this session.  He presents with poor balance with posterior lean.  Unable to progress to standing at this time.  Pt able to sit edge of bed x 5 min but he was lethargic and unsafe to progress to standing in his current state.    Transfers Overall transfer level:  (unable to progress at this time due to lethargy)                      Ambulation/Gait                   Stairs             Wheelchair Mobility     Tilt Bed    Modified Rankin (Stroke Patients Only)       Balance Overall balance assessment: Needs assistance, History of Falls Sitting-balance support: No upper extremity supported, Feet supported Sitting balance-Leahy Scale: Poor  Communication Communication Communication: No apparent difficulties  Cognition Arousal: Alert Behavior During Therapy: WFL for tasks assessed/performed   PT - Cognitive impairments: History of cognitive impairments                         Following commands: Impaired Following commands impaired: Follows one step commands with increased time, Only follows one step commands consistently    Cueing Cueing Techniques: Verbal cues, Gestural cues, Tactile cues  Exercises      General Comments         Pertinent Vitals/Pain Pain Assessment Pain Assessment: Faces Faces Pain Scale: Hurts little more Pain Location: L side/ribs Pain Descriptors / Indicators: Sore Pain Intervention(s): Monitored during session, Repositioned    Home Living                          Prior Function            PT Goals (current goals can now be found in the care plan section) Acute Rehab PT Goals Patient Stated Goal: SNF then back to ALF Potential to Achieve Goals: Good Progress towards PT goals: Progressing toward goals    Frequency    Min 2X/week      PT Plan      Co-evaluation              AM-PAC PT "6 Clicks" Mobility   Outcome Measure  Help needed turning from your back to your side while in a flat bed without using bedrails?: Total Help needed moving from lying on your back to sitting on the side of a flat bed without using bedrails?: Total Help needed moving to and from a bed to a chair (including a wheelchair)?: Total Help needed standing up from a chair using your arms (e.g., wheelchair or bedside chair)?: Total Help needed to walk in hospital room?: Total Help needed climbing 3-5 steps with a railing? : Total 6 Click Score: 6    End of Session Equipment Utilized During Treatment: Oxygen Activity Tolerance: Patient tolerated treatment well Patient left: with call bell/phone within reach;in bed (tilted to eat lunch so alarm is not set) Nurse Communication: Mobility status PT Visit Diagnosis: Unsteadiness on feet (R26.81);Pain;Difficulty in walking, not elsewhere classified (R26.2) Pain - Right/Left: Left     Time: 9604-5409 PT Time Calculation (min) (ACUTE ONLY): 40 min  Charges:    $Therapeutic Activity: 38-52 mins PT General Charges $$ ACUTE PT VISIT: 1 Visit                     Beulah Brunt , PTA Acute Rehabilitation Services Office [phone]    Reynolds Cea 12/04/2023, 12:08 PM

## 2023-12-04 NOTE — TOC Progression Note (Signed)
 Transition of Care [facility]) - Progression Note    Patient Details  Name: Alex Smith MRN: [id number] Date of Birth: [birth year]  [facility] CM/SW Contact  Eric Nees, Avanell Leigh, RN Phone Number: 12/04/2023, 11:35 AM  Clinical Narrative:    Updated [facility] that patient is not medically stable today.    Expected Discharge Plan: Skilled Nursing Facility Barriers to Discharge: Continued Medical Work up, SNF Pending bed offer  Expected Discharge Plan and Services     Post Acute Care Choice: Skilled Nursing Facility Living arrangements for the past 2 months: Assisted Living Facility ([facility]                                       Social Determinants of Health (SDOH) Interventions SDOH Screenings   Food Insecurity: No Food Insecurity (11/30/2023)  Housing: Low Risk  (11/30/2023)  Transportation Needs: No Transportation Needs (11/30/2023)  Utilities: Not At Risk (11/30/2023)  Depression (PHQ2-9): Medium Risk (03/09/2019)  Financial Resource Strain: Low Risk  (11/17/2023)   Received from [facility]  Social Connections: Socially Integrated (11/30/2023)  Tobacco Use: Medium Risk (12/01/2023)    Readmission Risk Interventions    04/13/2022    2:31 PM 08/19/2021   10:21 AM 05/11/2021   10:42 AM  Readmission Risk Prevention Plan  Transportation Screening Complete Complete Complete  PCP or Specialist Appt within 3-5 Days Complete    HRI or Home Care Consult Complete    Social Work Consult for Recovery Care Planning/Counseling Complete    Palliative Care Screening Complete    Medication Review [facility]) Complete Complete Complete  PCP or Specialist appointment within 3-5 days of discharge  Complete Complete  HRI or Home Care Consult  Complete Complete  SW Recovery Care/Counseling Consult  Complete Complete  Palliative Care Screening  Not Applicable Complete  Skilled Nursing Facility  Complete Complete   Calla Catchings, RN, BSN   Trauma/Neuro ICU Case Manager [phone]

## 2023-12-04 NOTE — Plan of Care (Signed)
 Problem: Education: Goal: Knowledge of General Education information will improve Description: Including pain rating scale, medication(s)/side effects and non-pharmacologic comfort measures 12/04/2023 0403 by Durand Gift, RN Outcome: Progressing 12/04/2023 0403 by Durand Gift, RN Outcome: Progressing   Problem: Health Behavior/Discharge Planning: Goal: Ability to manage health-related needs will improve 12/04/2023 0403 by Durand Gift, RN Outcome: Progressing 12/04/2023 0403 by Durand Gift, RN Outcome: Progressing   Problem: Clinical Measurements: Goal: Ability to maintain clinical measurements within normal limits will improve 12/04/2023 0403 by Durand Gift, RN Outcome: Progressing 12/04/2023 0403 by Durand Gift, RN Outcome: Progressing Goal: Will remain free from infection 12/04/2023 0403 by Durand Gift, RN Outcome: Progressing 12/04/2023 0403 by Durand Gift, RN Outcome: Progressing Goal: Diagnostic test results will improve 12/04/2023 0403 by Durand Gift, RN Outcome: Progressing 12/04/2023 0403 by Durand Gift, RN Outcome: Progressing Goal: Respiratory complications will improve 12/04/2023 0403 by Durand Gift, RN Outcome: Progressing 12/04/2023 0403 by Durand Gift, RN Outcome: Progressing Goal: Cardiovascular complication will be avoided 12/04/2023 0403 by Durand Gift, RN Outcome: Progressing 12/04/2023 0403 by Durand Gift, RN Outcome: Progressing   Problem: Activity: Goal: Risk for activity intolerance will decrease 12/04/2023 0403 by Durand Gift, RN Outcome: Progressing 12/04/2023 0403 by Durand Gift, RN Outcome: Progressing   Problem: Nutrition: Goal: Adequate nutrition will be maintained 12/04/2023 0403 by Durand Gift, RN Outcome: Progressing 12/04/2023 0403 by Durand Gift, RN Outcome: Progressing   Problem:  Coping: Goal: Level of anxiety will decrease 12/04/2023 0403 by Durand Gift, RN Outcome: Progressing 12/04/2023 0403 by Durand Gift, RN Outcome: Progressing   Problem: Elimination: Goal: Will not experience complications related to bowel motility 12/04/2023 0403 by Durand Gift, RN Outcome: Progressing 12/04/2023 0403 by Durand Gift, RN Outcome: Progressing Goal: Will not experience complications related to urinary retention 12/04/2023 0403 by Durand Gift, RN Outcome: Progressing 12/04/2023 0403 by Durand Gift, RN Outcome: Progressing   Problem: Pain Managment: Goal: General experience of comfort will improve and/or be controlled 12/04/2023 0403 by Durand Gift, RN Outcome: Progressing 12/04/2023 0403 by Durand Gift, RN Outcome: Progressing   Problem: Safety: Goal: Ability to remain free from injury will improve 12/04/2023 0403 by Durand Gift, RN Outcome: Progressing 12/04/2023 0403 by Durand Gift, RN Outcome: Progressing   Problem: Skin Integrity: Goal: Risk for impaired skin integrity will decrease 12/04/2023 0403 by Durand Gift, RN Outcome: Progressing 12/04/2023 0403 by Durand Gift, RN Outcome: Progressing   Problem: Education: Goal: Ability to describe self-care measures that may prevent or decrease complications (Diabetes Survival Skills Education) will improve 12/04/2023 0403 by Durand Gift, RN Outcome: Progressing 12/04/2023 0403 by Durand Gift, RN Outcome: Progressing Goal: Individualized Educational Video(s) 12/04/2023 0403 by Durand Gift, RN Outcome: Progressing 12/04/2023 0403 by Durand Gift, RN Outcome: Progressing   Problem: Coping: Goal: Ability to adjust to condition or change in health will improve 12/04/2023 0403 by Durand Gift, RN Outcome: Progressing 12/04/2023 0403 by Durand Gift, RN Outcome:  Progressing   Problem: Fluid Volume: Goal: Ability to maintain a balanced intake and output will improve 12/04/2023 0403 by Durand Gift, RN Outcome: Progressing 12/04/2023 0403 by Durand Gift, RN Outcome: Progressing   Problem: Health Behavior/Discharge Planning: Goal: Ability to identify and utilize available resources and services will improve 12/04/2023 0403 by Durand Gift, RN Outcome: Progressing 12/04/2023 0403 by  Durand Gift, RN Outcome: Progressing Goal: Ability to manage health-related needs will improve 12/04/2023 0403 by Durand Gift, RN Outcome: Progressing 12/04/2023 0403 by Durand Gift, RN Outcome: Progressing   Problem: Metabolic: Goal: Ability to maintain appropriate glucose levels will improve 12/04/2023 0403 by Durand Gift, RN Outcome: Progressing 12/04/2023 0403 by Durand Gift, RN Outcome: Progressing   Problem: Nutritional: Goal: Maintenance of adequate nutrition will improve 12/04/2023 0403 by Durand Gift, RN Outcome: Progressing 12/04/2023 0403 by Durand Gift, RN Outcome: Progressing Goal: Progress toward achieving an optimal weight will improve 12/04/2023 0403 by Durand Gift, RN Outcome: Progressing 12/04/2023 0403 by Durand Gift, RN Outcome: Progressing   Problem: Skin Integrity: Goal: Risk for impaired skin integrity will decrease 12/04/2023 0403 by Durand Gift, RN Outcome: Progressing 12/04/2023 0403 by Durand Gift, RN Outcome: Progressing   Problem: Tissue Perfusion: Goal: Adequacy of tissue perfusion will improve 12/04/2023 0403 by Durand Gift, RN Outcome: Progressing 12/04/2023 0403 by Durand Gift, RN Outcome: Progressing

## 2023-12-04 NOTE — Progress Notes (Signed)
 Progress Note     Subjective: No family at bedside yet this AM. Patient is alert but unable to tell me place/time/situation. Oriented to self only. He denies significant chest pain this AM.   Objective: Vital signs in last 24 hours: Temp:  [97.5 F (36.4 C)-98.7 F (37.1 C)] 98.7 F (37.1 C) (04/17 0800) Pulse Rate:  [64-93] 93 (04/17 0800) Resp:  [16-18] 16 (04/17 0800) BP: (113-144)/(47-64) 144/54 (04/17 0800) SpO2:  [92 %-95 %] 92 % (04/17 0800) Last BM Date :  (UTA)  Intake/Output from previous day: 04/16 0701 - 04/17 0700 In: 177.1 [I.V.:177.1] Out: 500 [Urine:500] Intake/Output this shift: No intake/output data recorded.  PE: General: pleasant, WD, elderly male, NAD HEENT: head is normocephalic, atraumatic. EOMI Heart: regular, rate, and rhythm. Palpable radial and pedal pulses bilaterally Lungs: CTAB, no wheezes, rhonchi, or rales noted.  Respiratory effort nonlabored on supplemental O2 @2L /min Abd: soft, NT, ND     Lab Results:  Recent Labs    12/03/23 0622 12/04/23 0604  WBC 5.5 5.2  HGB 7.6* 7.6*  HCT 23.6* 23.6*  PLT 92* 96*   BMET Recent Labs    12/03/23 0622 12/04/23 0604  NA 132* 135  K 4.4 4.8  CL 100 104  CO2 24 21*  GLUCOSE 309* 197*  BUN 48* 56*  CREATININE 2.48* 2.68*  CALCIUM 8.0* 8.5*   PT/INR No results for input(s): "LABPROT", "INR" in the last 72 hours. CMP     Component Value Date/Time   NA 135 12/04/2023 0604   K 4.8 12/04/2023 0604   K 4.5 09/19/2014 1511   CL 104 12/04/2023 0604   CO2 21 (L) 12/04/2023 0604   GLUCOSE 197 (H) 12/04/2023 0604   BUN 56 (H) 12/04/2023 0604   CREATININE 2.68 (H) 12/04/2023 0604   CREATININE 1.52 (H) 09/03/2023 1020   CREATININE 1.45 (H) 11/14/2017 1647   CALCIUM 8.5 (L) 12/04/2023 0604   PROT 7.5 11/05/2023 1013   PROT 7.5 01/10/2012 1539   ALBUMIN 3.7 11/05/2023 1013   ALBUMIN 4.0 01/10/2012 1539   AST 17 11/05/2023 1013   AST 16 09/03/2023 1020   ALT 14 11/05/2023 1013   ALT  13 09/03/2023 1020   ALT 29 01/10/2012 1539   ALKPHOS 79 11/05/2023 1013   ALKPHOS 53 08/20/2013 0000   BILITOT 1.0 11/05/2023 1013   BILITOT 0.7 09/03/2023 1020   GFRNONAA 22 (L) 12/04/2023 0604   GFRNONAA 44 (L) 09/03/2023 1020   GFRNONAA 58 (L) 12/14/2014 1109   GFRAA 45 (L) 05/05/2020 1314   GFRAA >60 12/14/2014 1109   Lipase     Component Value Date/Time   LIPASE 19 08/13/2023 0614       Studies/Results: No results found.   Anti-infectives: Anti-infectives (From admission, onward)    None        Assessment/Plan  Fall from standing  L 3-9 rib fractures with L pleural effusion - multimodal pain control, IS, pulm toilet, CXR stable 4/14 Extrapleural hematoma - hgb stable at 7.6 Chronic Thrombocytopenia - plts 96K, hold SQH T2DM - SSI, increased semglee 12 u at bedtime and 5u novolog for meal coverage per DM coordinator recs Chronic L 2nd toe infection - wound care ordered, f/u outpatient with podiatry AKI on CKD stage III - Cr 2.6 today. I&Ox1 for UA. All nephrotoxic meds have been discontinued. Renal consulted. Baseline appears around 1.4 HTN HLD BPH Dementia   FEN: CM diet, NS @75  cc/h VTE: hold SQH ID: no current  abx  Dispo: 5N. SNF once renal function improving.     LOS: 4 days   I reviewed last 24 h vitals and pain scores, last 48 h intake and output, last 24 h labs and trends, and last 24 h imaging results.  This care required moderate level of medical decision making.    Annetta Killian, [facility] 12/04/2023, 10:01 AM Please see Amion for pager number during day hours 7:00am-4:30pm

## 2023-12-04 NOTE — Consult Note (Signed)
 [facility]  HISTORY AND PHYSICAL  Alex Smith is an 89 y.o. male.    Chief Complaint: fall  HPI: Pt is an 28M with a PMH sig for HTN, HLD DM II, CKD 3, Alzheimer's dementia, h/o hairy cell leukemia who is now seen in consultation at the request of the trauma service for AKI on CKD.  Pt presented 11/30/23 after a fall in which he broke several ribs.  Was given home losartan, torsemide, and also given ibuprofen.  He hasn't been eating or drinking much at all.  Notes reflect some delirium too.    At bedside is [name] who helps provide history.  He appears comfortable.  Sleeping without complaint.  All nephrotoxic meds have been stopped and he has been started on NS @ 75 mL/ hr.    PMH: Past Medical History:  Diagnosis Date   Actinic keratosis    Alzheimer's dementia (HCC) 08/18/2021   Anemia    B12 deficiency    Basal cell carcinoma 03/30/2018   left ant nasal ala   Basal cell carcinoma 06/25/2023   R cheek, schedule for [facility]   Basal cell carcinoma of face 01/19/2013   right distal dorsum nose   BPH (benign prostatic hypertrophy)    followed by urology, discharged (Dr. 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 pretibial. 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 EDC 09/07/2020   Squamous cell carcinoma of skin 04/03/2020   Left cheek. WD SCC   Systolic murmur 05/28/2016   Type 2 diabetes, controlled,  with retinopathy (HCC)    PSH: Past Surgical History:  Procedure Laterality Date   25 GAUGE PARS PLANA VITRECTOMY WITH 20 GAUGE MVR PORT FOR MACULAR HOLE  07/07/2012   Procedure: 25 GAUGE PARS PLANA VITRECTOMY WITH 20 GAUGE MVR PORT FOR MACULAR HOLE;  Surgeon: Rexene Catching, MD;  Location: [facility] OR;  Service: Ophthalmology;  Laterality: Left;   BONE MARROW BIOPSY  2016   CARDIOVASCULAR STRESS TEST  2013   treadmill - no evidence ischemia, EF 61%   CATARACT EXTRACTION W/ INTRAOCULAR LENS  IMPLANT, BILATERAL  ~ 2010   COLONOSCOPY  2014   Elliot WNL no rpt needed, h/o polyps   EYE SURGERY Left 06/2012   laser surgery   GAS INSERTION  07/07/2012   Procedure: INSERTION OF GAS;  Surgeon: Rexene Catching, MD;  Location: [facility] OR;  Service: Ophthalmology;  Laterality: Left;  [id number]   PERIPHERAL VASCULAR CATHETERIZATION N/A 02/23/2015   Procedure: [place] Stade Cath Insertion;  Surgeon: Celso College, MD;  Location: [facility] INVASIVE CV LAB;  Service: Cardiovascular;  Laterality: N/A;   PORTA CATH REMOVAL N/A 02/16/2020   Procedure: PORTA CATH REMOVAL;  Surgeon: Celso College, MD;  Location: [facility] INVASIVE CV LAB;  Service: Cardiovascular;  Laterality: N/A;   SERUM PATCH  07/07/2012  Procedure: SERUM PATCH;  Surgeon: Rexene Catching, MD;  Location: [facility] OR;  Service: Ophthalmology;  Laterality: Left;   SKIN CANCER EXCISION     "all over my face" (07/07/2012)     Past Medical History:  Diagnosis Date   Actinic keratosis    Alzheimer's dementia (HCC) 08/18/2021   Anemia    B12 deficiency    Basal cell carcinoma 03/30/2018   left ant nasal ala   Basal cell carcinoma 06/25/2023   R cheek, schedule for [facility]   Basal cell carcinoma of face 01/19/2013   right distal dorsum nose   BPH (benign prostatic hypertrophy)    followed by urology, discharged (Dr. Cherylene Corrente)   CAP (community acquired pneumonia) 02/15/2015   CKD stage 3 due to type 2 diabetes mellitus (HCC) 11/14/2017   Colon polyps    Diverticulosis     Dysplastic nevus 10/27/2006   right med calf   GERD (gastroesophageal reflux disease)    Hairy cell leukemia (HCC) 2006   recurrent, seizure on rituxan, now on cladribine Beverely Buba)   History of pneumonia 2000's   "once" (07/07/2012)   History of shingles    HLD (hyperlipidemia)    Hypertension    Pneumonia    Shortness of breath dyspnea    Squamous cell carcinoma in situ 07/05/2020   Left lat. pretibial. 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 EDC 09/07/2020   Squamous cell carcinoma of skin 04/03/2020   Left cheek. WD SCC   Systolic murmur 05/28/2016   Type 2 diabetes, controlled, with retinopathy (HCC)     Medications:  Scheduled:  acetaminophen  1,000 mg Oral Q6H   amLODipine  10 mg Oral Daily   aspirin EC  81 mg Oral Daily   cyanocobalamin  1,000 mcg Oral Daily   docusate sodium  100 mg Oral BID   donepezil  5 mg Oral Daily   famotidine  20 mg Oral Daily   insulin aspart  0-15 Units Subcutaneous TID WC   insulin aspart  0-5 Units Subcutaneous QHS   insulin aspart  5 Units Subcutaneous TID WC   insulin glargine-yfgn  12 Units Subcutaneous QHS   lidocaine  2 patch Transdermal Q24H   melatonin  5 mg Oral QHS   multivitamin with minerals  1 tablet Oral Daily   pantoprazole  40 mg Oral BID   pravastatin  20 mg Oral Daily   sertraline  25 mg Oral Daily   tamsulosin  0.4 mg Oral QHS   vitamin E  400 Units Oral Daily    Medications Prior to Admission  Medication Sig Dispense Refill   acetaminophen (TYLENOL) 500 MG tablet Take 2 tablets (1,000 mg total) by mouth 2 (two) times daily. 120 tablet 0   albuterol (VENTOLIN HFA) 108 (90 Base) MCG/ACT inhaler Inhale 2 puffs into the lungs every 6 (six) hours as needed for wheezing or shortness of breath.     alum & mag hydroxide-simeth (MAALOX/MYLANTA) 200-200-20 MG/5ML suspension Take 30 mLs by mouth every 4 (four) hours as needed for indigestion or heartburn. (Patient taking  differently: Take 30 mLs by mouth 4 (four) times daily as needed for indigestion or heartburn.) 355 mL 0   amLODipine (NORVASC) 10 MG tablet Take 1 tablet (10 mg total) by mouth daily.     amoxicillin-clavulanate (AUGMENTIN) 875-125 MG tablet Take 1 tablet by mouth 2 (two) times daily.     aspirin EC 81 MG tablet Take 81  mg by mouth daily.     donepezil (ARICEPT) 5 MG tablet Take 5 mg by mouth daily.     doxycycline (ADOXA) 100 MG tablet Take 100 mg by mouth 2 (two) times daily.     famotidine (PEPCID) 20 MG tablet Take 20 mg by mouth daily.     guaiFENesin (GERI-TUSSIN) 100 MG/5ML liquid Take 15 mLs by mouth every 6 (six) hours as needed for cough.     insulin glargine (LANTUS) 100 UNIT/ML Solostar Pen Inject 14 Units into the skin at bedtime. (Patient taking differently: Inject 8 Units into the skin at bedtime.)     insulin lispro (HUMALOG) 100 UNIT/ML KwikPen Inject 2 Units into the skin 3 (three) times daily before meals. Per sliding scale (Patient taking differently: Inject 7 Units into the skin 3 (three) times daily before meals.) 15 mL 11   loperamide (IMODIUM A-D) 2 MG tablet Take 4 mg by mouth See admin instructions. Give 2 tablets (4mg ) by mouth after each loose stool as needed. Limit 8 tablets in 24 hours.     losartan (COZAAR) 25 MG tablet Take 1 tablet (25 mg total) by mouth daily. 30 tablet 0   Luspatercept-aamt (REBLOZYL [place]) Inject 100 mg into the skin See admin instructions. Infuse 100mg  into vein, every 3 weeks at oncology office.     magnesium hydroxide (MILK OF MAGNESIA) 400 MG/5ML suspension Take 30 mLs by mouth daily as needed (constipation).     Melatonin 10 MG TABS Take 10 mg by mouth at bedtime.     Multiple Vitamin (MULTIVITAMIN WITH MINERALS) TABS tablet Take 1 tablet by mouth daily.     pantoprazole (PROTONIX) 40 MG tablet Take 1 tablet (40 mg total) by mouth 2 (two) times daily.     pravastatin (PRAVACHOL) 20 MG tablet Take 20 mg by mouth at bedtime.     sertraline  (ZOLOFT) 25 MG tablet Take 25 mg by mouth daily.     tamsulosin (FLOMAX) 0.4 MG CAPS capsule TAKE 1 CAPSULE BY MOUTH EVERY DAY (Patient taking differently: Take 0.4 mg by mouth at bedtime.) 30 capsule 3   torsemide (DEMADEX) 20 MG tablet Take 20 mg by mouth daily.     triamcinolone cream (KENALOG) 0.1 % Apply 1 Application topically 2 (two) times daily. Apply liberally to bilateral lower legs.     UNABLE TO FIND Apply 1 application  topically daily. Remove dressing and cleanse the foot, toes and wound with Vashe or saline or distilled water. Put Betadine on gauze and place on wound. Cover it with dry gauze then ABD. Place a dry 4x4 gauze on anterior ankle to protect it. Wrap with Kerlix then loose ACE. Secure with tape. Do this daily for the left 2nd toe.     vitamin B-12 (CYANOCOBALAMIN) 1000 MCG tablet Take 1 tablet (1,000 mcg total) by mouth daily. 90 tablet 3   vitamin E 400 UNIT capsule Take 400 Units by mouth daily.      ALLERGIES:   Allergies  Allergen Reactions   Rituxan [Rituximab] Rash and Other (See Comments)    Chest tightness    Blood-Group Specific Substance Other (See Comments)    Had a post transfusion reaction of red blood cells; NOW REQUIRES WASHED BLOOD CELLS   Primaxin Iv [Imipenem-Cilastatin] Other (See Comments)    Unknown reaction   Primaxin [Imipenem] Other (See Comments)    Unknown reaction Tolerates cephalosporins   Vfend [Voriconazole] Other (See Comments)    Unknown reaction   Sulfa Antibiotics  Itching and Rash   Sulfacetamide Sodium Itching and Rash    FAM HX: Family History  Problem Relation Age of Onset   Dementia Mother    Heart failure Father 45   Cancer Sister        breast   Diabetes Paternal Uncle    Diabetes Paternal Aunt    CAD Brother 60       MI   Stroke Neg Hx     Social History:   reports that he quit smoking about 54 years ago. His smoking use included cigarettes. He started smoking about 79 years ago. He has a 50 pack-year  smoking history. He has never used smokeless tobacco. He reports that he does not currently use alcohol. He reports that he does not use drugs.  ROS: ROS: unable to obtain d/t sleeping  Blood pressure (!) 118/56, pulse 75, temperature 97.6 F (36.4 C), temperature source Oral, resp. rate 16, height 5\' 7"  (1.702 m), SpO2 99%. PHYSICAL EXAM: Physical Exam GEN NAD, sleeping HEENT dry MM NECK no JVD PULM clear CV RRR ABD soft EXT no LE edema NEURO sleeping   Results for orders placed or performed during the hospital encounter of 11/30/23 (from the past 48 hours)  Glucose, capillary     Status: Abnormal   Collection Time: 12/02/23  4:05 PM  Result Value Ref Range   Glucose-Capillary 204 (H) 70 - 99 mg/dL    Comment: Glucose reference range applies only to samples taken after fasting for at least 8 hours.  Glucose, capillary     Status: Abnormal   Collection Time: 12/02/23  9:33 PM  Result Value Ref Range   Glucose-Capillary 264 (H) 70 - 99 mg/dL    Comment: Glucose reference range applies only to samples taken after fasting for at least 8 hours.  Glucose, capillary     Status: Abnormal   Collection Time: 12/03/23  6:19 AM  Result Value Ref Range   Glucose-Capillary 317 (H) 70 - 99 mg/dL    Comment: Glucose reference range applies only to samples taken after fasting for at least 8 hours.  CBC     Status: Abnormal   Collection Time: 12/03/23  6:22 AM  Result Value Ref Range   WBC 5.5 4.0 - 10.5 K/uL   RBC 2.19 (L) 4.22 - 5.81 MIL/uL   Hemoglobin 7.6 (L) 13.0 - 17.0 g/dL   HCT 96.2 (L) 95.2 - 84.1 %   MCV 107.8 (H) 80.0 - 100.0 fL   MCH 34.7 (H) 26.0 - 34.0 pg   MCHC 32.2 30.0 - 36.0 g/dL   RDW 32.4 (H) 40.1 - 02.7 %   Platelets 92 (L) 150 - 400 K/uL    Comment: Immature Platelet Fraction may be clinically indicated, consider ordering this additional test [id number] REPEATED TO VERIFY    nRBC 0.4 (H) 0.0 - 0.2 %    Comment: Performed at [facility], [address], [place], [place] [address]  Basic metabolic panel     Status: Abnormal   Collection Time: 12/03/23  6:22 AM  Result Value Ref Range   Sodium 132 (L) 135 - 145 mmol/L   Potassium 4.4 3.5 - 5.1 mmol/L   Chloride 100 98 - 111 mmol/L   CO2 24 22 - 32 mmol/L   Glucose, Bld 309 (H) 70 - 99 mg/dL    Comment: Glucose reference range applies only to samples taken after fasting for at least 8 hours.   BUN 48 (H)  8 - 23 mg/dL   Creatinine, Ser 0.16 (H) 0.61 - 1.24 mg/dL   Calcium 8.0 (L) 8.9 - 10.3 mg/dL   GFR, Estimated 24 (L) >60 mL/min    Comment: (NOTE) Calculated using the CKD-EPI Creatinine Equation (2021)    Anion gap 8 5 - 15    Comment: Performed at [facility], [address] [place], [place] [address]  Glucose, capillary     Status: Abnormal   Collection Time: 12/03/23 11:20 AM  Result Value Ref Range   Glucose-Capillary 302 (H) 70 - 99 mg/dL    Comment: Glucose reference range applies only to samples taken after fasting for at least 8 hours.   Comment 1 Notify RN    Comment 2 Document in Chart   Glucose, capillary     Status: Abnormal   Collection Time: 12/03/23  4:42 PM  Result Value Ref Range   Glucose-Capillary 126 (H) 70 - 99 mg/dL    Comment: Glucose reference range applies only to samples taken after fasting for at least 8 hours.   Comment 1 Notify RN    Comment 2 Document in Chart   Glucose, capillary     Status: Abnormal   Collection Time: 12/03/23  9:29 PM  Result Value Ref Range   Glucose-Capillary 107 (H) 70 - 99 mg/dL    Comment: Glucose reference range applies only to samples taken after fasting for at least 8 hours.  CBC     Status: Abnormal   Collection Time: 12/04/23  6:04 AM  Result Value Ref Range   WBC 5.2 4.0 - 10.5 K/uL   RBC 2.19 (L) 4.22 - 5.81 MIL/uL   Hemoglobin 7.6 (L) 13.0 - 17.0 g/dL   HCT 23.5 (L) 57.3 - 22.0 %   MCV 107.8 (H) 80.0 - 100.0 fL   MCH 34.7 (H) 26.0 - 34.0 pg   MCHC 32.2 30.0 - 36.0 g/dL   RDW 25.4 (H) 27.0 - 62.3 %    Platelets 96 (L) 150 - 400 K/uL    Comment: Immature Platelet Fraction may be clinically indicated, consider ordering this additional test [id number] CONSISTENT WITH PREVIOUS RESULT REPEATED TO VERIFY    nRBC 0.6 (H) 0.0 - 0.2 %    Comment: Performed at [facility], [address], [place], [place] [address]  Basic metabolic panel     Status: Abnormal   Collection Time: 12/04/23  6:04 AM  Result Value Ref Range   Sodium 135 135 - 145 mmol/L   Potassium 4.8 3.5 - 5.1 mmol/L   Chloride 104 98 - 111 mmol/L   CO2 21 (L) 22 - 32 mmol/L   Glucose, Bld 197 (H) 70 - 99 mg/dL    Comment: Glucose reference range applies only to samples taken after fasting for at least 8 hours.   BUN 56 (H) 8 - 23 mg/dL   Creatinine, Ser 7.37 (H) 0.61 - 1.24 mg/dL   Calcium 8.5 (L) 8.9 - 10.3 mg/dL   GFR, Estimated 22 (L) >60 mL/min    Comment: (NOTE) Calculated using the CKD-EPI Creatinine Equation (2021)    Anion gap 10 5 - 15    Comment: Performed at [facility], [address], [place], [place] [address]  Glucose, capillary     Status: Abnormal   Collection Time: 12/04/23  6:26 AM  Result Value Ref Range   Glucose-Capillary 172 (H) 70 - 99 mg/dL    Comment: Glucose reference range applies only to  samples taken after fasting for at least 8 hours.  Urinalysis, Routine w reflex microscopic -Urine, Catheterized     Status: Abnormal   Collection Time: 12/04/23 10:00 AM  Result Value Ref Range   Color, Urine YELLOW YELLOW   APPearance HAZY (A) CLEAR   Specific Gravity, Urine 1.015 1.005 - 1.030   pH 5.0 5.0 - 8.0   Glucose, UA NEGATIVE NEGATIVE mg/dL   Hgb urine dipstick NEGATIVE NEGATIVE   Bilirubin Urine NEGATIVE NEGATIVE   Ketones, ur NEGATIVE NEGATIVE mg/dL   Protein, ur 30 (A) NEGATIVE mg/dL   Nitrite NEGATIVE NEGATIVE   Leukocytes,Ua NEGATIVE NEGATIVE   RBC / HPF 0-5 0 - 5 RBC/hpf   WBC, UA 6-10 0 - 5 WBC/hpf   Bacteria, UA RARE (A) NONE SEEN   Squamous Epithelial / HPF 0-5 0 -  5 /HPF   Mucus PRESENT    Hyaline Casts, UA PRESENT     Comment: Performed at [facility], [address], [place], [place] [address]  Glucose, capillary     Status: Abnormal   Collection Time: 12/04/23 11:47 AM  Result Value Ref Range   Glucose-Capillary 173 (H) 70 - 99 mg/dL    Comment: Glucose reference range applies only to samples taken after fasting for at least 8 hours.   *Note: Due to a large number of results and/or encounters for the requested time period, some results have not been displayed. A complete set of results can be found in Results Review.    No results found.  Assessment/Plan  AKI on CKD 3a: last Cr known 1.3 08/2023.  Now with AKI in the setting of mult rib fractures, meds, and poor PO intake.  Now Cr up to 2.6, still making urine.    - agree with holding torsemide and losartan  - ibuprofen already switched to Tylenol  - increased NS to 100 mL/ hr  - will get renal US   - no role for HD- expect to improve with the above measures  - d/w [name] at bedside  2.  Fall  - per trauma service  3.  Dementia/ Delirium  - on Aricept, delirium prec  4.  DM II  - per primary  5.  Dispo: pending  Leandra Pro 12/04/2023, 3:55 PM

## 2023-12-04 NOTE — Progress Notes (Signed)
 Mobility Specialist Progress Note:    12/04/23 1000  Mobility  Activity Turned to back - supine (bed level mobility)  Level of Assistance Standby assist, set-up cues, supervision of patient - no hands on  Assistive Device None  Range of Motion/Exercises All extremities;Active;Active Assistive  Activity Response Tolerated poorly  Mobility Referral Yes  Mobility visit 1 Mobility  Mobility Specialist Start Time (ACUTE ONLY) 1031  Mobility Specialist Stop Time (ACUTE ONLY) 1036  Mobility Specialist Time Calculation (min) (ACUTE ONLY) 5 min   Pt received in bed and agreeable. Very lethargic and having difficulty staying awake. Daughter at bedside. C/o R hip pain. Able to perform ankle pumps, knee flexion/extension, and bilat arm raise. Needing minA to raise R arm. OOB activity deferred d/t fatigue and lethargy. Pt left in bed with call bell and all needs met. Daughter present.  D'Vante Nolon Baxter Mobility Specialist Please contact via Special educational needs teacher or Rehab office at [phone]

## 2023-12-05 ENCOUNTER — Inpatient Hospital Stay (HOSPITAL_COMMUNITY)

## 2023-12-05 LAB — BASIC METABOLIC PANEL WITH GFR
Anion gap: 10 (ref 5–15)
BUN: 53 mg/dL — ABNORMAL HIGH (ref 8–23)
CO2: 22 mmol/L (ref 22–32)
Calcium: 8.2 mg/dL — ABNORMAL LOW (ref 8.9–10.3)
Chloride: 104 mmol/L (ref 98–111)
Creatinine, Ser: 2.26 mg/dL — ABNORMAL HIGH (ref 0.61–1.24)
GFR, Estimated: 27 mL/min — ABNORMAL LOW (ref >60)
Glucose, Bld: 252 mg/dL — ABNORMAL HIGH (ref 70–99)
Potassium: 4.3 mmol/L (ref 3.5–5.1)
Sodium: 136 mmol/L (ref 135–145)

## 2023-12-05 LAB — GLUCOSE, CAPILLARY
Glucose-Capillary: 234 mg/dL — ABNORMAL HIGH (ref 70–99)
Glucose-Capillary: 262 mg/dL — ABNORMAL HIGH (ref 70–99)
Glucose-Capillary: 222 mg/dL — ABNORMAL HIGH (ref 70–99)
Glucose-Capillary: 198 mg/dL — ABNORMAL HIGH (ref 70–99)

## 2023-12-05 LAB — CBC
HCT: 22.6 % — ABNORMAL LOW (ref 39.0–52.0)
Hemoglobin: 7.2 g/dL — ABNORMAL LOW (ref 13.0–17.0)
MCH: 34.4 pg — ABNORMAL HIGH (ref 26.0–34.0)
MCHC: 31.9 g/dL (ref 30.0–36.0)
MCV: 108.1 fL — ABNORMAL HIGH (ref 80.0–100.0)
Platelets: 101 10*3/uL — ABNORMAL LOW (ref 150–400)
RBC: 2.09 MIL/uL — ABNORMAL LOW (ref 4.22–5.81)
RDW: 16.9 % — ABNORMAL HIGH (ref 11.5–15.5)
WBC: 3.6 10*3/uL — ABNORMAL LOW (ref 4.0–10.5)
nRBC: 0.8 % — ABNORMAL HIGH (ref 0.0–0.2)

## 2023-12-05 NOTE — Progress Notes (Signed)
 Reason for Consult: AKI on CKD Referring Physician: Trauma service  Alex Smith is an 89 y.o. male.  HPI: PMH sig for HTN, HLD DM II, CKD 3a, Alzheimer's dementia, h/o hairy cell leukemia here for multiple rib fractures and L pleural effusion after fall now consulted for for AKI on CKD3a.   Assessed at bedside with daughter Mariah Shines present. Reports increased alertness, previously documented hospital delirium now improved.   Continued pain from rib fractures, worse with taking deep breaths.  PO intake back to normal, drinking adequate fluids this AM including water, OJ and coffee.  UOP not well document, had at least output overnight. Foley in placed with ~260mL clear urine.   PMH:   Past Medical History:  Diagnosis Date   Actinic keratosis    Alzheimer's dementia (HCC) 08/18/2021   Anemia    B12 deficiency    Basal cell carcinoma 03/30/2018   left ant nasal ala   Basal cell carcinoma 06/25/2023   R cheek, schedule for [facility]   Basal cell carcinoma of face 01/19/2013   right distal dorsum nose   BPH (benign prostatic hypertrophy)    followed by urology, discharged (Dr. 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 pretibial. 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 EDC 09/07/2020   Squamous cell carcinoma of skin 04/03/2020   Left cheek. WD SCC   Systolic murmur 05/28/2016   Type 2 diabetes, controlled, with retinopathy  (HCC)     PSH:   Past Surgical History:  Procedure Laterality Date   25 GAUGE PARS PLANA VITRECTOMY WITH 20 GAUGE MVR PORT FOR MACULAR HOLE  07/07/2012   Procedure: 25 GAUGE PARS PLANA VITRECTOMY WITH 20 GAUGE MVR PORT FOR MACULAR HOLE;  Surgeon: Rexene Catching, MD;  Location: [facility] OR;  Service: Ophthalmology;  Laterality: Left;   BONE MARROW BIOPSY  2016   CARDIOVASCULAR STRESS TEST  2013   treadmill - no evidence ischemia, EF 61%   CATARACT EXTRACTION W/ INTRAOCULAR LENS  IMPLANT, BILATERAL  ~ 2010   COLONOSCOPY  2014   [place]iot WNL no rpt needed, h/o polyps   EYE SURGERY Left 06/2012   laser surgery   GAS INSERTION  07/07/2012   Procedure: INSERTION OF GAS;  Surgeon: Rexene Catching, MD;  Location: [facility] OR;  Service: Ophthalmology;  Laterality: Left;  C3F8   PERIPHERAL VASCULAR CATHETERIZATION N/A 02/23/2015   Procedure: [place] Stade Cath Insertion;  Surgeon: Celso College, MD;  Location: [facility] INVASIVE CV LAB;  Service: Cardiovascular;  Laterality: N/A;   PORTA CATH REMOVAL N/A 02/16/2020   Procedure: PORTA CATH REMOVAL;  Surgeon: Celso College, MD;  Location: [facility] INVASIVE CV LAB;  Service: Cardiovascular;  Laterality: N/A;   SERUM PATCH  07/07/2012   Procedure: SERUM PATCH;  Surgeon: Rexene Catching, MD;  Location: [facility] OR;  Service: Ophthalmology;  Laterality: Left;   SKIN CANCER EXCISION     "all over my face" (07/07/2012)    Allergies:  Allergies  Allergen Reactions   Rituxan [Rituximab] Rash and Other (See Comments)    Chest tightness    Blood-Group Specific Substance Other (See Comments)    Had a post transfusion reaction of red blood cells; NOW REQUIRES WASHED BLOOD CELLS   Primaxin Iv [Imipenem-Cilastatin] Other (See Comments)    Unknown reaction   Primaxin [Imipenem] Other (See Comments)    Unknown reaction Tolerates cephalosporins   Vfend [Voriconazole] Other (See Comments)    Unknown reaction   Sulfa Antibiotics Itching and Rash   Sulfacetamide Sodium Itching and Rash     Medications:   Prior to Admission medications   Medication Sig Start Date End Date Taking? Authorizing Provider  acetaminophen  (TYLENOL ) 500 MG tablet Take 2 tablets (1,000 mg total) by mouth 2 (two) times daily. 11/27/22  Yes Wieting, Richard, MD  albuterol  (VENTOLIN  HFA) [phone] Base) MCG/ACT inhaler Inhale 2 puffs into the lungs every 6 (six) hours as needed for wheezing or shortness of breath. 04/20/22  Yes Garnet Just, MD  alum & mag hydroxide-simeth (MAALOX/MYLANTA) 200-200-20 MG/5ML suspension Take 30 mLs by mouth every 4 (four) hours as needed for indigestion or heartburn. Patient taking differently: Take 30 mLs by mouth 4 (four) times daily as needed for indigestion or heartburn. 04/20/22  Yes Garnet Just, MD  amLODipine  (NORVASC ) 10 MG tablet Take 1 tablet (10 mg total) by mouth daily. 04/20/22  Yes Garnet Just, MD  amoxicillin-clavulanate (AUGMENTIN) 875-125 MG tablet Take 1 tablet by mouth 2 (two) times daily.   Yes [provider]  aspirin  EC 81 MG tablet Take 81 mg by mouth daily.   Yes [provider]  donepezil  (ARICEPT ) 5 MG tablet Take 5 mg by mouth daily.   Yes [provider]  doxycycline (ADOXA) 100 MG tablet Take 100 mg by mouth 2 (two) times daily.   Yes [provider]  famotidine  (PEPCID ) 20 MG tablet Take 20 mg by mouth daily.   Yes [provider]  guaiFENesin  (GERI-TUSSIN) 100 MG/5ML liquid Take 15 mLs by mouth every 6 (six) hours as needed for cough.   Yes [provider]  insulin  glargine (LANTUS ) 100 UNIT/ML Solostar Pen Inject 14 Units into the skin at bedtime. Patient taking differently: Inject 8 Units into the skin at bedtime. 09/18/23  Yes Lorita Rosa, MD  insulin  lispro (HUMALOG ) 100 UNIT/ML KwikPen Inject 2 Units into the skin 3 (three) times daily before meals. Per sliding scale Patient taking differently: Inject 7 Units into the skin 3 (three) times daily before meals. 11/27/22  Yes Wieting, Richard, MD   loperamide  (IMODIUM  A-D) 2 MG tablet Take 4 mg by mouth See admin instructions. Give 2 tablets (4mg ) by mouth after each loose stool as needed. Limit 8 tablets in 24 hours.   Yes [provider]  losartan  (COZAAR ) 25 MG tablet Take 1 tablet (25 mg total) by mouth daily. 11/27/22 01/24/24 Yes Wieting, Richard, MD  Luspatercept -aamt (REBLOZYL  [place]) Inject 100 mg into the skin See admin instructions. Infuse 100mg  into vein, every 3 weeks at oncology office.   Yes [provider]  magnesium  hydroxide (MILK OF MAGNESIA) 400 MG/5ML suspension Take 30 mLs by mouth daily as needed (constipation).   Yes [provider]  Melatonin 10 MG TABS Take 10 mg by mouth at bedtime.   Yes [provider]  Multiple Vitamin (MULTIVITAMIN WITH  MINERALS) TABS tablet Take 1 tablet by mouth daily. 08/23/21  Yes Lesa Rape, MD  pantoprazole  (PROTONIX ) 40 MG tablet Take 1 tablet (40 mg total) by mouth 2 (two) times daily. 04/20/22  Yes Garnet Just, MD  pravastatin  (PRAVACHOL ) 20 MG tablet Take 20 mg by mouth at bedtime.   Yes [provider]  sertraline  (ZOLOFT ) 25 MG tablet Take 25 mg by mouth daily.   Yes [provider]  tamsulosin  (FLOMAX ) 0.4 MG CAPS capsule TAKE 1 CAPSULE BY MOUTH EVERY DAY Patient taking differently: Take 0.4 mg by mouth at bedtime. 12/11/17  Yes Claire Crick, MD  torsemide  (DEMADEX ) 20 MG tablet Take 20 mg by mouth daily.   Yes [provider]  triamcinolone cream (KENALOG) 0.1 % Apply 1 Application topically 2 (two) times daily. Apply liberally to bilateral lower legs.   Yes [provider]  UNABLE TO FIND Apply 1 application  topically daily. Remove dressing and cleanse the foot, toes and wound with Vashe or saline or distilled water. Put Betadine on gauze and place on wound. Cover it with dry gauze then ABD. Place a dry 4x4 gauze on anterior ankle to protect it. Wrap with Kerlix then loose ACE. Secure with tape. Do this daily for the  left 2nd toe.   Yes [provider]  vitamin B-12 (CYANOCOBALAMIN ) 1000 MCG tablet Take 1 tablet (1,000 mcg total) by mouth daily. 06/01/19  Yes Claire Crick, MD  vitamin E  400 UNIT capsule Take 400 Units by mouth daily.   Yes [provider]    Discontinued Meds:   Medications Discontinued During This Encounter  Medication Reason   Luspatercept -aamt (REBLOZYL  [place])    melatonin 5 MG TABS Dose change   donepezil  (ARICEPT  ODT) 5 MG disintegrating tablet Discontinued by provider   famotidine  (PEPCID ) 20 MG tablet Discontinued by provider   oxyCODONE  (Oxy IR/ROXICODONE ) immediate release tablet 5 mg    enoxaparin  (LOVENOX ) injection 30 mg    acetaminophen  (TYLENOL ) 325 MG tablet    donepezil  (ARICEPT ) tablet 5 mg    famotidine  (PEPCID ) tablet 20 mg    ibuprofen  (ADVIL ) tablet 600 mg    methocarbamol  (ROBAXIN ) injection 500 mg    methocarbamol  (ROBAXIN ) tablet 500 mg    insulin  glargine-yfgn (SEMGLEE ) injection 8 Units    insulin  aspart (novoLOG ) injection 2 Units    gabapentin  (NEURONTIN ) capsule 300 mg    methocarbamol  (ROBAXIN ) tablet 1,000 mg    HYDROmorphone  (DILAUDID ) injection 1 mg    torsemide  (DEMADEX ) tablet 20 mg    losartan  (COZAAR ) tablet 25 mg    HYDROmorphone  (DILAUDID ) injection 0.5 mg    melatonin tablet 10 mg    methocarbamol  (ROBAXIN ) tablet 500 mg     Social History:  reports that he quit smoking about 54 years ago. His smoking use included cigarettes. He started smoking about 79 years ago. He has a 50 pack-year smoking history. He has never used smokeless tobacco. He reports that he does not currently use alcohol. He reports that he does not use drugs.  Family History:   Family History  Problem Relation Age of Onset   Dementia Mother    Heart failure Father 35   Cancer Sister        breast   Diabetes Paternal Uncle    Diabetes Paternal Aunt    CAD Brother 68       MI   Stroke Neg Hx    Pertinent items are noted in HPI. Creatinine   Date/Time  Value Ref Range Status  09/03/2023 10:20 AM 1.52 (H) 0.61 - 1.24 mg/dL Final  13/03/6577 46:96 PM 1.51 (H) 0.61 - 1.24 mg/dL Final  29/52/8413 24:40 PM 1.53 (H) 0.61 - 1.24 mg/dL Final  06/15/2535 64:40 AM 1.20 mg/dL Final    Comment:    3.47-4.25 NOTE: New Reference Range  10/25/14   11/18/2014 09:05 AM 1.23 mg/dL Final    Comment:    9.56-3.87 NOTE: New Reference Range  10/25/14   11/14/2014 03:01 PM 1.27 (H) mg/dL Final    Comment:    5.64-3.32 NOTE: New Reference Range  10/25/14   09/19/2014 03:11 PM 1.19 0.60 - 1.30 mg/dL Final  95/18/8416 60:63 PM 1.37 (H) 0.60 - 1.30 mg/dL Final  01/60/1093 23:55 PM 1.11 0.60 - 1.30 mg/dL Final  73/22/0254 27:06 PM 1.17 0.60 - 1.30 mg/dL Final  23/76/2831 51:76 PM 1.08 0.60 - 1.30 mg/dL Final  16/02/3709 62:69 PM 1.17 0.60 - 1.30 mg/dL Final  48/54/6270 35:00 PM 1.03 0.60 - 1.30 mg/dL Final  93/81/8299 37:16 AM 1.15 0.60 - 1.30 mg/dL Final  96/78/9381 01:75 PM 1.09 0.60 - 1.30 mg/dL Final  06/12/8526 78:24 PM 1.09 0.60 - 1.30 mg/dL Final  23/53/6144 31:54 PM 1.18 0.60 - 1.30 mg/dL Final  00/86/7619 50:93 PM 1.01 0.60 - 1.30 mg/dL Final  26/71/2458 09:98 PM 0.98 0.60 - 1.30 mg/dL Final  33/82/5053 97:67 PM 0.92 0.60 - 1.30 mg/dL Final  34/19/3790 24:09 AM 1.05 0.60 - 1.30 mg/dL Final  73/53/2992 42:68 AM 1.18 0.60 - 1.30 mg/dL Final  34/19/6222 97:98 PM 0.85 0.60 - 1.30 mg/dL Final  92/06/9416 40:81 PM 0.97 0.60 - 1.30 mg/dL Final  44/81/8563 14:97 AM 1.06 0.60 - 1.30 mg/dL Final  02/63/7858 85:02 PM 1.15 0.60 - 1.30 mg/dL Final  77/41/2878 67:67 PM 1.00 0.60 - 1.30 mg/dL Final  20/94/7096 28:36 PM 0.94 0.60 - 1.30 mg/dL Final   Creat  Date/Time Value Ref Range Status  11/14/2017 04:47 PM 1.45 (H) 0.70 - 1.11 mg/dL Final    Comment:    For patients >76 years of age, the reference limit for Creatinine is approximately 13% higher for people identified as African-American. Aaron Aas   08/20/2013 12:00 AM 0.8  Final    Creatinine, Ser  Date/Time Value Ref Range Status  12/05/2023 04:00 AM 2.26 (H) 0.61 - 1.24 mg/dL Final  62/94/7654 65:03 AM 2.68 (H) 0.61 - 1.24 mg/dL Final  54/65/6812 75:17 AM 2.48 (H) 0.61 - 1.24 mg/dL Final  00/17/4944 96:75 AM 2.20 (H) 0.61 - 1.24 mg/dL Final  91/63/8466 59:93 PM 1.86 (H) 0.61 - 1.24 mg/dL Final  57/08/7791 90:30 AM 1.84 (H) 0.61 - 1.24 mg/dL Final  05/11/3006 62:26 PM 1.75 (H) 0.61 - 1.24 mg/dL Final  33/35/4562 56:38 PM 1.65 (H) 0.61 - 1.24 mg/dL Final  93/73/4287 68:11 AM 1.71 (H) 0.61 - 1.24 mg/dL Final  57/26/2035 59:74 AM 1.30 (H) 0.61 - 1.24 mg/dL Final  16/38/4536 46:80 PM 1.54 (H) 0.61 - 1.24 mg/dL Final  32/07/2481 50:03 AM 1.67 (H) 0.61 - 1.24 mg/dL Final  70/48/8891 69:45 AM 1.61 (H) 0.61 - 1.24 mg/dL Final  03/88/8280 03:49 AM 1.77 (H) 0.61 - 1.24 mg/dL Final  17/91/5056 97:94 AM 1.66 (H) 0.61 - 1.24 mg/dL Final  80/16/5537 48:27 AM 1.89 (H) 0.61 - 1.24 mg/dL Final  07/86/7544 92:01 AM 2.11 (H) 0.61 - 1.24 mg/dL Final  00/71/2197 58:83 AM 2.28 (H) 0.61 - 1.24 mg/dL Final  25/49/8264 15:83 AM 2.29 (H) 0.61 - 1.24 mg/dL  Final  11/22/2022 05:10 AM 2.51 (H) 0.61 - 1.24 mg/dL Final  09/81/1914 78:29 AM 2.37 (H) 0.61 - 1.24 mg/dL Final  56/21/3086 57:84 AM 2.30 (H) 0.61 - 1.24 mg/dL Final  69/62/9528 41:32 AM 2.26 (H) 0.61 - 1.24 mg/dL Final  44/08/270 53:66 PM 2.27 (H) 0.61 - 1.24 mg/dL Final  44/10/4740 59:56 AM 1.45 (H) 0.61 - 1.24 mg/dL Final  38/75/6433 29:51 PM 1.40 (H) 0.61 - 1.24 mg/dL Final  88/41/6606 30:16 PM 1.43 (H) 0.61 - 1.24 mg/dL Final  08/27/3233 57:32 AM 1.41 (H) 0.61 - 1.24 mg/dL Final  20/25/4270 62:37 AM 1.55 (H) 0.61 - 1.24 mg/dL Final  62/83/1517 61:60 AM 1.44 (H) 0.61 - 1.24 mg/dL Final  73/71/0626 94:85 AM 1.73 (H) 0.61 - 1.24 mg/dL Final  46/27/0350 09:38 AM 1.63 (H) 0.61 - 1.24 mg/dL Final  18/29/9371 69:67 AM 1.38 (H) 0.61 - 1.24 mg/dL Final  89/38/1017 51:02 AM 1.20 0.61 - 1.24 mg/dL Final  58/52/7782 42:35 PM 1.31  (H) 0.61 - 1.24 mg/dL Final  36/14/4315 40:08 PM 1.30 (H) 0.61 - 1.24 mg/dL Final  67/61/9509 32:67 AM 1.26 (H) 0.61 - 1.24 mg/dL Final  12/45/8099 83:38 PM 1.14 0.61 - 1.24 mg/dL Final  25/12/3974 73:41 PM 1.04 0.61 - 1.24 mg/dL Final  93/79/0240 97:35 AM 1.09 0.61 - 1.24 mg/dL Final  32/99/2426 83:41 AM 1.29 (H) 0.61 - 1.24 mg/dL Final  96/22/2979 89:21 AM 1.31 (H) 0.61 - 1.24 mg/dL Final  19/41/7408 14:48 AM 1.21 0.61 - 1.24 mg/dL Final  18/56/3149 70:26 AM 1.12 0.61 - 1.24 mg/dL Final  37/85/8850 27:74 AM 1.11 0.61 - 1.24 mg/dL Final  12/87/8676 72:09 AM 1.27 (H) 0.61 - 1.24 mg/dL Final  47/04/6282 66:29 AM 1.66 (H) 0.61 - 1.24 mg/dL Final  47/65/4650 35:46 PM 1.61 (H) 0.61 - 1.24 mg/dL Final  56/81/2751 70:01 AM 1.51 (H) 0.61 - 1.24 mg/dL Final  74/94/4967 59:16 AM 1.53 (H) 0.61 - 1.24 mg/dL Final  38/46/6599 35:70 AM 1.34 (H) 0.61 - 1.24 mg/dL Final  17/79/3903 00:92 PM 1.39 (H) 0.61 - 1.24 mg/dL Final   Recent Labs  Lab 11/30/23 1232 11/30/23 2038 12/01/23 0228 12/01/23 1252 12/02/23 0435 12/03/23 0622 12/04/23 0604 12/05/23 0400  NA 137  --  137 138 136 132* 135 136  K 4.9  --  5.1 4.0 4.3 4.4 4.8 4.3  CL 103  --  104 104 103 100 104 104  CO2 23  --  26 27 26 24  21* 22  GLUCOSE 309*  --  241* 168* 225* 309* 197* 252*  BUN 34*  --  33* 33* 38* 48* 56* 53*  CREATININE 1.65* 1.75* 1.84* 1.86* 2.20* 2.48* 2.68* 2.26*  CALCIUM  8.8*  --  8.6* 8.9 8.2* 8.0* 8.5* 8.2*   Recent Labs  Lab 12/02/23 0435 12/03/23 0622 12/04/23 0604 12/05/23 0400  WBC 5.3 5.5 5.2 3.6*  HGB 7.8* 7.6* 7.6* 7.2*  HCT 24.9* 23.6* 23.6* 22.6*  MCV 110.2* 107.8* 107.8* 108.1*  PLT 79* 92* 96* 101*   Liver Function Tests: No results for input(s): "AST", "ALT", "ALKPHOS", "BILITOT", "PROT", "ALBUMIN" in the last 168 hours. No results for input(s): "LIPASE", "AMYLASE" in the last 168 hours. No results for input(s): "AMMONIA" in the last 168 hours. Cardiac Enzymes: No results for input(s):  "CKTOTAL", "CKMB", "CKMBINDEX", "TROPONINI" in the last 168 hours. Iron Studies: No results for input(s): "IRON", "TIBC", "TRANSFERRIN", "FERRITIN" in the last 72 hours.  Results for orders placed or performed during the hospital  encounter of 11/30/23 (from the past 48 hours)  Glucose, capillary     Status: Abnormal   Collection Time: 12/03/23 11:20 AM  Result Value Ref Range   Glucose-Capillary 302 (H) 70 - 99 mg/dL    Comment: Glucose reference range applies only to samples taken after fasting for at least 8 hours.   Comment 1 Notify RN    Comment 2 Document in Chart   Glucose, capillary     Status: Abnormal   Collection Time: 12/03/23  4:42 PM  Result Value Ref Range   Glucose-Capillary 126 (H) 70 - 99 mg/dL    Comment: Glucose reference range applies only to samples taken after fasting for at least 8 hours.   Comment 1 Notify RN    Comment 2 Document in Chart   Glucose, capillary     Status: Abnormal   Collection Time: 12/03/23  9:29 PM  Result Value Ref Range   Glucose-Capillary 107 (H) 70 - 99 mg/dL    Comment: Glucose reference range applies only to samples taken after fasting for at least 8 hours.  CBC     Status: Abnormal   Collection Time: 12/04/23  6:04 AM  Result Value Ref Range   WBC 5.2 4.0 - 10.5 K/uL   RBC 2.19 (L) 4.22 - 5.81 MIL/uL   Hemoglobin 7.6 (L) 13.0 - 17.0 g/dL   HCT 16.1 (L) 09.6 - 04.5 %   MCV 107.8 (H) 80.0 - 100.0 fL   MCH 34.7 (H) 26.0 - 34.0 pg   MCHC 32.2 30.0 - 36.0 g/dL   RDW 40.9 (H) 81.1 - 91.4 %   Platelets 96 (L) 150 - 400 K/uL    Comment: Immature Platelet Fraction may be clinically indicated, consider ordering this additional test [id number] CONSISTENT WITH PREVIOUS RESULT REPEATED TO VERIFY    nRBC 0.6 (H) 0.0 - 0.2 %    Comment: Performed at [facility], [address], [place], [place] [address]  Basic metabolic panel     Status: Abnormal   Collection Time: 12/04/23  6:04 AM  Result Value Ref Range   Sodium 135 135 - 145  mmol/L   Potassium 4.8 3.5 - 5.1 mmol/L   Chloride 104 98 - 111 mmol/L   CO2 21 (L) 22 - 32 mmol/L   Glucose, Bld 197 (H) 70 - 99 mg/dL    Comment: Glucose reference range applies only to samples taken after fasting for at least 8 hours.   BUN 56 (H) 8 - 23 mg/dL   Creatinine, Ser 7.84 (H) 0.61 - 1.24 mg/dL   Calcium  8.5 (L) 8.9 - 10.3 mg/dL   GFR, Estimated 22 (L) >60 mL/min    Comment: (NOTE) Calculated using the CKD-EPI Creatinine Equation (2021)    Anion gap 10 5 - 15    Comment: Performed at [facility], [address] [place], [place] [address]  Glucose, capillary     Status: Abnormal   Collection Time: 12/04/23  6:26 AM  Result Value Ref Range   Glucose-Capillary 172 (H) 70 - 99 mg/dL    Comment: Glucose reference range applies only to samples taken after fasting for at least 8 hours.  Urinalysis, Routine w reflex microscopic -Urine, Catheterized     Status: Abnormal   Collection Time: 12/04/23 10:00 AM  Result Value Ref Range   Color, Urine YELLOW YELLOW   APPearance HAZY (A) CLEAR   Specific Gravity, Urine 1.015 1.005 - 1.030   pH 5.0 5.0 -  8.0   Glucose, UA NEGATIVE NEGATIVE mg/dL   Hgb urine dipstick NEGATIVE NEGATIVE   Bilirubin Urine NEGATIVE NEGATIVE   Ketones, ur NEGATIVE NEGATIVE mg/dL   Protein, ur 30 (A) NEGATIVE mg/dL   Nitrite NEGATIVE NEGATIVE   Leukocytes,Ua NEGATIVE NEGATIVE   RBC / HPF 0-5 0 - 5 RBC/hpf   WBC, UA 6-10 0 - 5 WBC/hpf   Bacteria, UA RARE (A) NONE SEEN   Squamous Epithelial / HPF 0-5 0 - 5 /HPF   Mucus PRESENT    Hyaline Casts, UA PRESENT     Comment: Performed at [facility], [address], [place], [place] [address]  Glucose, capillary     Status: Abnormal   Collection Time: 12/04/23 11:47 AM  Result Value Ref Range   Glucose-Capillary 173 (H) 70 - 99 mg/dL    Comment: Glucose reference range applies only to samples taken after fasting for at least 8 hours.  Glucose, capillary     Status: Abnormal   Collection Time:  12/04/23  4:32 PM  Result Value Ref Range   Glucose-Capillary 193 (H) 70 - 99 mg/dL    Comment: Glucose reference range applies only to samples taken after fasting for at least 8 hours.  Glucose, capillary     Status: Abnormal   Collection Time: 12/04/23 10:50 PM  Result Value Ref Range   Glucose-Capillary 148 (H) 70 - 99 mg/dL    Comment: Glucose reference range applies only to samples taken after fasting for at least 8 hours.  CBC     Status: Abnormal   Collection Time: 12/05/23  4:00 AM  Result Value Ref Range   WBC 3.6 (L) 4.0 - 10.5 K/uL   RBC 2.09 (L) 4.22 - 5.81 MIL/uL   Hemoglobin 7.2 (L) 13.0 - 17.0 g/dL   HCT 60.4 (L) 54.0 - 98.1 %   MCV 108.1 (H) 80.0 - 100.0 fL   MCH 34.4 (H) 26.0 - 34.0 pg   MCHC 31.9 30.0 - 36.0 g/dL   RDW 19.1 (H) 47.8 - 29.5 %   Platelets 101 (L) 150 - 400 K/uL   nRBC 0.8 (H) 0.0 - 0.2 %    Comment: Performed at [facility], [address], [place], [place] [address]  Basic metabolic panel     Status: Abnormal   Collection Time: 12/05/23  4:00 AM  Result Value Ref Range   Sodium 136 135 - 145 mmol/L   Potassium 4.3 3.5 - 5.1 mmol/L   Chloride 104 98 - 111 mmol/L   CO2 22 22 - 32 mmol/L   Glucose, Bld 252 (H) 70 - 99 mg/dL    Comment: Glucose reference range applies only to samples taken after fasting for at least 8 hours.   BUN 53 (H) 8 - 23 mg/dL   Creatinine, Ser 8.65 (H) 0.61 - 1.24 mg/dL   Calcium  8.2 (L) 8.9 - 10.3 mg/dL   GFR, Estimated 27 (L) >60 mL/min    Comment: (NOTE) Calculated using the CKD-EPI Creatinine Equation (2021)    Anion gap 10 5 - 15    Comment: Performed at [facility], [address], [place], [place] [address]  Glucose, capillary     Status: Abnormal   Collection Time: 12/05/23  6:00 AM  Result Value Ref Range   Glucose-Capillary 234 (H) 70 - 99 mg/dL    Comment: Glucose reference range applies only to samples taken after fasting for at least 8 hours.   *Note:  Due to a large number of results  and/or encounters for the requested time period, some results have not been displayed. A complete set of results can be found in Results Review.    No results found.   Blood pressure (!) 156/58, pulse 85, temperature 97.6 F (36.4 C), temperature source Oral, resp. rate 18, height 5\' 7"  (1.702 m), SpO2 95%. General appearance: alert, cooperative, and mildly confused Resp: Normal WOB on 2L [place]. Mildly diminished breath sounds at bases Cardio: regular rate and rhythm and systolic murmur: late systolic 3/6, buzzing at lower left sternal border, at apex GI: soft, non-tender; bowel sounds normal; no masses,  no organomegaly Extremities: extremities normal, atraumatic, no cyanosis or edema  Assessment/Plan: AKI on CKD3a/b: Cr improved with IV rehydration, likely prerenal and expect to continue to improve now that patient is back to adequate PO intake. - Hold home Toresmide and Losartan , restart at SNF  - Avoid NSAIDs for pain management - Renal US  pending - PO fluid replacement - Trend BMP, if continues to improve anticipate ok to d/c tomorrow - F/u with Dr. Zelda Hickman for OP Nephrology HTN: mildly elevated pressures likely in the setting of fracture pain and holding home Losartan  and Torsemide  Fall: per primary Dementia: Improved this AM, continue home meds T2DM: per primary   Jonne Netters 12/05/2023, 10:25 AM

## 2023-12-05 NOTE — Inpatient Diabetes Management (Signed)
 Inpatient Diabetes Program Recommendations  AACE/ADA: New Consensus Statement on Inpatient Glycemic Control (2015)  Target Ranges:  Prepandial:   less than 140 mg/dL      Peak postprandial:   less than 180 mg/dL (1-2 hours)      Critically ill patients:  140 - 180 mg/dL    Latest Reference Range & Units 12/04/23 06:26 12/04/23 11:47 12/04/23 16:32 12/04/23 22:50  Glucose-Capillary 70 - 99 mg/dL 827 (H)  8 units Novolog   173 (H)  8 units Novolog   193 (H)  8 units Novolog   148 (H)    12 units Semglee   (H): Data is abnormally high  Latest Reference Range & Units 12/05/23 06:00 12/05/23 12:17  Glucose-Capillary 70 - 99 mg/dL 765 (H)  10 units Novolog   262 (H)  (H): Data is abnormally high    SNF DM Meds: Lantus  8 units at HS   Humalog  7 units TID with meals    Current Orders: Semglee  12 units at HS    Novolog  0-15 units TID ac/hs    Novolog  5 units TID with meals     MD- Please consider:  1. Increase Semglee  slightly to 14 units at HS  2. Increase Novolog  Meal Coveage slightly to 6 units TID with meals    --Will follow patient during hospitalization--  Adina Rudolpho Arrow RN, MSN, CDCES Diabetes Coordinator Inpatient Glycemic Control Team Team Pager: [phone] (8a-5p)

## 2023-12-05 NOTE — Plan of Care (Signed)
  Problem: Education: Goal: Knowledge of General Education information will improve Description: Including pain rating scale, medication(s)/side effects and non-pharmacologic comfort measures Outcome: Progressing   Problem: Activity: Goal: Risk for a

## 2023-12-05 NOTE — Progress Notes (Signed)
 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 All nephrotoxic meds have been discontinued. Renal consulted. Baseline appears around 1.4 HTN HLD BPH Dementia    FEN: CM diet VTE: hold SQH ID: no current abx   Dispo: 5N. SNF once renal function improving.         Alex Billow, MD [facility] practice Office: [phone]        Chief Complaint: Fall, rib fx's  Subjective: Patient in bed, family at bedside.  Much more lucid, appropriate today with responses.  Tolerating diet.  Objective: Vital signs in last 24 hours: Temp:  [97.6 F (36.4 C)-98 F (36.7 C)] 97.6 F (36.4 C) (04/18 0503) Pulse Rate:  [75-85] 85 (04/18 0503) Resp:  [18] 18 (04/18 0503) BP: (118-156)/(56-59) 156/58 (04/18 0503) SpO2:  [94 %-99 %] 95 % (04/18 0503) Last BM Date :  (UTA)  Intake/Output from previous day: 04/17 0701 - 04/18 0700 In: 480 [P.O.:480] Out: 350 [Urine:350] Intake/Output this shift: No intake/output data recorded.  Physical Exam: HEENT - sclerae clear, mucous membranes moist Chest - tender left lateral chest wall, no crepitance  Lab Results:  Recent Labs    12/04/23 0604 12/05/23 0400  WBC 5.2 3.6*  HGB 7.6* 7.2*  HCT 23.6* 22.6*  PLT 96* 101*   BMET Recent Labs    12/04/23 0604 12/05/23 0400  NA 135 136  K 4.8 4.3  CL 104 104  CO2 21* 22  GLUCOSE 197* 252*  BUN 56* 53*  CREATININE 2.68* 2.26*  CALCIUM  8.5* 8.2*   PT/INR No results for input(s): "LABPROT", "INR" in the last 72 hours. Comprehensive Metabolic Panel:     Component Value Date/Time   NA 136 12/05/2023 0400   NA 135 12/04/2023 0604   K 4.3 12/05/2023 0400   K 4.8 12/04/2023 0604   K 4.5 09/19/2014 1511   K 4.0 07/25/2014 1415   CL 104 12/05/2023 0400   CL 104 12/04/2023 0604   CO2 22 12/05/2023 0400   CO2 21 (L) 12/04/2023 0604   BUN 53 (H) 12/05/2023 0400   BUN 56 (H) 12/04/2023 0604   CREATININE 2.26 (H) 12/05/2023 0400   CREATININE 2.68 (H) 12/04/2023 0604   CREATININE 1.52 (H) 09/03/2023 1020   CREATININE 1.51 (H) 04/04/2023 1304   CREATININE 1.45 (H) 11/14/2017 1647   CREATININE 1.20 12/14/2014 1109   CREATININE 1.23 11/18/2014 0905   CREATININE 0.8 08/20/2013 0000   GLUCOSE 252 (H) 12/05/2023 0400   GLUCOSE 197 (H) 12/04/2023 0604   CALCIUM  8.2 (L) 12/05/2023 0400   CALCIUM  8.5 (L) 12/04/2023 0604   AST 17 11/05/2023 1013   AST 22 09/18/2023 0459   AST 16 09/03/2023 1020   AST 15 04/04/2023 1304   ALT 14 11/05/2023 1013   ALT 12 09/18/2023 0459   ALT 13 09/03/2023 1020   ALT 12 04/04/2023 1304   ALT 29 01/10/2012 1539   ALT 27 09/05/2011 1513   ALKPHOS 79 11/05/2023 1013   ALKPHOS 45 09/18/2023 0459   ALKPHOS  53 08/20/2013 0000   ALKPHOS 84 01/10/2012 1539   ALKPHOS 80 09/05/2011 1513   BILITOT 1.0 11/05/2023 1013   BILITOT 0.8 09/18/2023 0459   BILITOT 0.7 09/03/2023 1020   BILITOT 0.4 04/04/2023 1304   PROT 7.5 11/05/2023 1013   PROT 6.2 (L) 09/18/2023 0459   PROT 7.5 01/10/2012 1539   PROT 7.2 09/05/2011 1513   ALBUMIN 3.7 11/05/2023 1013   ALBUMIN 2.9 (L) 09/18/2023 0459   ALBUMIN 4.0 01/10/2012 1539   ALBUMIN 3.8 09/05/2011 1513    Studies/Results: No results found.    Alex Smith 12/05/2023  Patient ID: Alex Smith, male   DOB: Mar 06, 1935, 89 y.o.   MRN: [id number]

## 2023-12-06 LAB — BASIC METABOLIC PANEL WITH GFR
Anion gap: 8 (ref 5–15)
BUN: 41 mg/dL — ABNORMAL HIGH (ref 8–23)
CO2: 21 mmol/L — ABNORMAL LOW (ref 22–32)
Calcium: 8.7 mg/dL — ABNORMAL LOW (ref 8.9–10.3)
Chloride: 107 mmol/L (ref 98–111)
Creatinine, Ser: 1.6 mg/dL — ABNORMAL HIGH (ref 0.61–1.24)
GFR, Estimated: 41 mL/min — ABNORMAL LOW (ref >60)
Glucose, Bld: 217 mg/dL — ABNORMAL HIGH (ref 70–99)
Potassium: 4.1 mmol/L (ref 3.5–5.1)
Sodium: 136 mmol/L (ref 135–145)

## 2023-12-06 LAB — GLUCOSE, CAPILLARY
Glucose-Capillary: 240 mg/dL — ABNORMAL HIGH (ref 70–99)
Glucose-Capillary: 244 mg/dL — ABNORMAL HIGH (ref 70–99)
Glucose-Capillary: 246 mg/dL — ABNORMAL HIGH (ref 70–99)
Glucose-Capillary: 252 mg/dL — ABNORMAL HIGH (ref 70–99)
Glucose-Capillary: 224 mg/dL — ABNORMAL HIGH (ref 70–99)

## 2023-12-06 NOTE — Plan of Care (Signed)
   Problem: Clinical Measurements: Goal: Ability to maintain clinical measurements within normal limits will improve Outcome: Progressing Goal: Will remain free from infection Outcome: Progressing

## 2023-12-06 NOTE — Progress Notes (Signed)
 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 All nephrotoxic meds have been held. Renal signed off today.  Follow up with his nephrologist in [facility] as outpatient. HTN HLD BPH Dementia    FEN: CM diet VTE: hold SQH ID: no current abx   Dispo: Per son, planning admission to SNF ([facility]) in [place] as soon as bed available.  Will defer to SW and CM.        [name], MD [facility] practice Office: [phone]        Chief Complaint: Fall, rib fx's  Subjective: Patient in bed, son at bedside, nurse in room.  No complaints.  Objective: Vital signs in last 24 hours: Temp:  [97.7 F (36.5 C)-99 F (37.2 C)] 97.9 F (36.6 C) (04/19 0855) Pulse Rate:  [87-164] 88 (04/19 0855) Resp:  [16-18] 18 (04/19 0855) BP: (128-153)/(54-69) 153/66 (04/19 0855) SpO2:  [90 %-99 %] 98 % (04/19 0855) Last BM Date : 12/06/23  Intake/Output from previous day: 04/18 0701 - 04/19 0700 In: 720 [P.O.:720] Out: 890 [Urine:890] Intake/Output this shift: No intake/output data recorded.  Physical Exam: HEENT - sclerae clear, mucous membranes moist Chest - tender on left chest wall, ecchymosis, no crepitance Abd - soft, non-tender Ext - no edema, non-tender  Lab Results:  Recent Labs    12/04/23 0604 12/05/23 0400  WBC 5.2 3.6*  HGB 7.6* 7.2*  HCT 23.6* 22.6*  PLT 96* 101*   BMET Recent Labs    12/05/23 0400 12/06/23 0839  NA 136 136  K 4.3 4.1  CL 104 107  CO2 22 21*  GLUCOSE 252* 217*  BUN 53* 41*  CREATININE 2.26* 1.60*  CALCIUM  8.2* 8.7*    PT/INR No results for input(s): "LABPROT", "INR" in the last 72 hours. Comprehensive Metabolic Panel:    Component Value Date/Time   NA 136 12/06/2023 0839   NA 136 12/05/2023 0400   K 4.1 12/06/2023 0839   K 4.3 12/05/2023 0400   K 4.5 09/19/2014 1511   K 4.0 07/25/2014 1415   CL 107 12/06/2023 0839   CL 104 12/05/2023 0400   CO2 21 (L) 12/06/2023 0839   CO2 22 12/05/2023 0400   BUN 41 (H) 12/06/2023 0839   BUN 53 (H) 12/05/2023 0400   CREATININE 1.60 (H) 12/06/2023 0839   CREATININE 2.26 (H) 12/05/2023 0400   CREATININE 1.52 (H) 09/03/2023 1020   CREATININE 1.51 (H) 04/04/2023 1304   CREATININE 1.45 (H) 11/14/2017 1647   CREATININE 1.20 12/14/2014 1109   CREATININE 1.23 11/18/2014 0905   CREATININE 0.8 08/20/2013 0000   GLUCOSE 217 (H) 12/06/2023 0839   GLUCOSE 252 (H) 12/05/2023 0400   CALCIUM  8.7 (L) 12/06/2023 0839   CALCIUM  8.2 (L) 12/05/2023 0400   AST 17 11/05/2023 1013   AST 22 09/18/2023 0459   AST 16 09/03/2023 1020   AST 15 04/04/2023 1304   ALT 14 11/05/2023 1013   ALT 12 09/18/2023 0459   ALT 13 09/03/2023 1020   ALT 12 04/04/2023 1304   ALT  29 01/10/2012 1539   ALT 27 09/05/2011 1513   ALKPHOS 79 11/05/2023 1013   ALKPHOS 45 09/18/2023 0459   ALKPHOS 53 08/20/2013 0000   ALKPHOS 84 01/10/2012 1539   ALKPHOS 80 09/05/2011 1513   BILITOT 1.0 11/05/2023 1013   BILITOT 0.8 09/18/2023 0459   BILITOT 0.7 09/03/2023 1020   BILITOT 0.4 04/04/2023 1304   PROT 7.5 11/05/2023 1013   PROT 6.2 (L) 09/18/2023 0459   PROT 7.5 01/10/2012 1539   PROT 7.2 09/05/2011 1513   ALBUMIN 3.7 11/05/2023 1013   ALBUMIN 2.9 (L) 09/18/2023 0459   ALBUMIN 4.0 01/10/2012 1539   ALBUMIN 3.8 09/05/2011 1513    Studies/Results: US  RENAL Result Date: 12/05/2023 CLINICAL DATA:  Acute on chronic kidney injury EXAM: RENAL / URINARY TRACT ULTRASOUND COMPLETE COMPARISON:  CT abdomen and pelvis dated 08/13/2023 renal ultrasound dated 11/19/2022 FINDINGS: Right Kidney: Length =  10.3 cm Normal parenchymal echogenicity with preserved corticomedullary differentiation. Mild renal cortical thinning. No urinary tract dilation or shadowing calculi. The ureter is not seen. Left Kidney: Length = 10.2 cm Normal parenchymal echogenicity with preserved corticomedullary differentiation. Mild renal cortical thinning. Left renal cyst seen on prior CT is not well seen on today's examination. No urinary tract dilation or shadowing calculi. The ureter is not seen. Bladder: Appears normal for degree of bladder distention. Other: Technically challenging examination due to overlying bowel gas. IMPRESSION: 1. No urinary tract dilation or shadowing calculi. 2. Mild bilateral renal cortical thinning. Electronically Signed   By: Limin  Xu M.D.   On: 12/05/2023 12:56      [name] 12/06/2023  Patient ID: Alex Smith, male   DOB: 1934-10-02, 89 y.o.   MRN: [id number]

## 2023-12-06 NOTE — Progress Notes (Signed)
 Patient ID: Alex Smith, male   DOB: [birth year], 89 y.o.   MRN: [id number] S: No complaints and no events overnight O:BP (!) 153/66 (BP Location: Left Arm)   Pulse 88   Temp 97.9 F (36.6 C) (Oral)   Resp 18   Ht 5\' 7"  (1.702 m)   SpO2 98%   BMI 30.54 kg/m   Intake/Output Summary (Last 24 hours) at 12/06/2023 1016 Last data filed at 12/05/2023 2052 Gross per 24 hour  Intake 480 ml  Output 890 ml  Net -410 ml   Intake/Output: I/O last 3 completed shifts: In: 1200 [P.O.:1200] Out: 1240 [Urine:1240]  Intake/Output this shift:  No intake/output data recorded. Weight change:  Gen: NAD CVS: RRR Resp:CTA Abd: +BS,soft, NT/ND Ext: no edema  Recent Labs  Lab 12/01/23 0228 12/01/23 1252 12/02/23 0435 12/03/23 0622 12/04/23 0604 12/05/23 0400 12/06/23 0839  NA 137 138 136 132* 135 136 136  K 5.1 4.0 4.3 4.4 4.8 4.3 4.1  CL 104 104 103 100 104 104 107  CO2 26 27 26 24  21* 22 21*  GLUCOSE 241* 168* 225* 309* 197* 252* 217*  BUN 33* 33* 38* 48* 56* 53* 41*  CREATININE 1.84* 1.86* 2.20* 2.48* 2.68* 2.26* 1.60*  CALCIUM  8.6* 8.9 8.2* 8.0* 8.5* 8.2* 8.7*   Liver Function Tests: No results for input(s): "AST", "ALT", "ALKPHOS", "BILITOT", "PROT", "ALBUMIN" in the last 168 hours. No results for input(s): "LIPASE", "AMYLASE" in the last 168 hours. No results for input(s): "AMMONIA" in the last 168 hours. CBC: Recent Labs  Lab 12/01/23 1252 12/02/23 0435 12/03/23 0622 12/04/23 0604 12/05/23 0400  WBC 6.3 5.3 5.5 5.2 3.6*  HGB 8.6* 7.8* 7.6* 7.6* 7.2*  HCT 27.0* 24.9* 23.6* 23.6* 22.6*  MCV 108.4* 110.2* 107.8* 107.8* 108.1*  PLT 93* 79* 92* 96* 101*   Cardiac Enzymes: No results for input(s): "CKTOTAL", "CKMB", "CKMBINDEX", "TROPONINI" in the last 168 hours. CBG: Recent Labs  Lab 12/05/23 0600 12/05/23 1217 12/05/23 1617 12/05/23 2040 12/06/23 0605  GLUCAP 234* 262* 222* 198* 240*    Iron Studies: No results for input(s): "IRON", "TIBC", "TRANSFERRIN",  "FERRITIN" in the last 72 hours. Studies/Results: US  RENAL Result Date: 12/05/2023 CLINICAL DATA:  Acute on chronic kidney injury EXAM: RENAL / URINARY TRACT ULTRASOUND COMPLETE COMPARISON:  CT abdomen and pelvis dated 08/13/2023 renal ultrasound dated 11/19/2022 FINDINGS: Right Kidney: Length = 10.3 cm Normal parenchymal echogenicity with preserved corticomedullary differentiation. Mild renal cortical thinning. No urinary tract dilation or shadowing calculi. The ureter is not seen. Left Kidney: Length = 10.2 cm Normal parenchymal echogenicity with preserved corticomedullary differentiation. Mild renal cortical thinning. Left renal cyst seen on prior CT is not well seen on today's examination. No urinary tract dilation or shadowing calculi. The ureter is not seen. Bladder: Appears normal for degree of bladder distention. Other: Technically challenging examination due to overlying bowel gas. IMPRESSION: 1. No urinary tract dilation or shadowing calculi. 2. Mild bilateral renal cortical thinning. Electronically Signed   By: Limin  Xu M.D.   On: 12/05/2023 12:56    acetaminophen   1,000 mg Oral Q6H   amLODipine   10 mg Oral Daily   aspirin  EC  81 mg Oral Daily   cyanocobalamin   1,000 mcg Oral Daily   docusate sodium   100 mg Oral BID   donepezil   5 mg Oral Daily   famotidine   20 mg Oral Daily   insulin  aspart  0-15 Units Subcutaneous TID WC   insulin  aspart  0-5 Units Subcutaneous  QHS   insulin  aspart  5 Units Subcutaneous TID WC   insulin  glargine-yfgn  12 Units Subcutaneous QHS   lidocaine   2 patch Transdermal Q24H   melatonin  5 mg Oral QHS   multivitamin with minerals  1 tablet Oral Daily   pantoprazole   40 mg Oral BID   pravastatin   20 mg Oral Daily   sertraline   25 mg Oral Daily   tamsulosin   0.4 mg Oral QHS   vitamin E   400 Units Oral Daily    BMET    Component Value Date/Time   NA 136 12/06/2023 0839   K 4.1 12/06/2023 0839   K 4.5 09/19/2014 1511   CL 107 12/06/2023 0839   CO2 21  (L) 12/06/2023 0839   GLUCOSE 217 (H) 12/06/2023 0839   BUN 41 (H) 12/06/2023 0839   CREATININE 1.60 (H) 12/06/2023 0839   CREATININE 1.52 (H) 09/03/2023 1020   CREATININE 1.45 (H) 11/14/2017 1647   CALCIUM  8.7 (L) 12/06/2023 0839   GFRNONAA 41 (L) 12/06/2023 0839   GFRNONAA 44 (L) 09/03/2023 1020   GFRNONAA 58 (L) 12/14/2014 1109   GFRAA 45 (L) 05/05/2020 1314   GFRAA >60 12/14/2014 1109   CBC    Component Value Date/Time   WBC 3.6 (L) 12/05/2023 0400   RBC 2.09 (L) 12/05/2023 0400   HGB 7.2 (L) 12/05/2023 0400   HGB 8.5 (L) 09/03/2023 1020   HGB 9.9 (L) 12/14/2014 1109   HGB 13.0 02/18/2013 0000   HCT 22.6 (L) 12/05/2023 0400   HCT 29.2 (L) 12/14/2014 1109   PLT 101 (L) 12/05/2023 0400   PLT 91 (L) 09/03/2023 1020   PLT 148 (L) 12/14/2014 1109   MCV 108.1 (H) 12/05/2023 0400   MCV 109 (H) 12/14/2014 1109   MCH 34.4 (H) 12/05/2023 0400   MCHC 31.9 12/05/2023 0400   RDW 16.9 (H) 12/05/2023 0400   RDW 14.6 (H) 12/14/2014 1109   LYMPHSABS 1.0 11/05/2023 1013   LYMPHSABS 0.7 (L) 12/14/2014 1109   MONOABS 0.2 11/05/2023 1013   MONOABS 0.0 (L) 12/14/2014 1109   EOSABS 0.1 11/05/2023 1013   EOSABS 0.1 12/14/2014 1109   BASOSABS 0.0 11/05/2023 1013   BASOSABS 0.0 12/14/2014 1109    Assessment/Plan: AKI on CKD3a/b: Cr improved with IV rehydration, likely prerenal and expect to continue to improve now that patient is back to adequate PO intake. - Hold home Toresmide and Losartan , restart at SNF and will need to follow renal function there. - Avoid NSAIDs for pain management - Renal US  pending - PO fluid replacement - Scr down to 1.6.  Stable from renal standpoint for discharge to SNF - F/u with his primary nephrologist Dr. Rica Chalet from [facility] in [place].  Will sign off for now.  Call with questions or concerns. HTN: mildly elevated pressures likely in the setting of fracture pain and holding home Losartan  and Torsemide  Fall: per primary Dementia: Improved this AM,  continue home meds T2DM: per primary    Benjamin Brands, MD [facility]

## 2023-12-07 LAB — GLUCOSE, CAPILLARY
Glucose-Capillary: 175 mg/dL — ABNORMAL HIGH (ref 70–99)
Glucose-Capillary: 213 mg/dL — ABNORMAL HIGH (ref 70–99)
Glucose-Capillary: 228 mg/dL — ABNORMAL HIGH (ref 70–99)
Glucose-Capillary: 182 mg/dL — ABNORMAL HIGH (ref 70–99)
Glucose-Capillary: 136 mg/dL — ABNORMAL HIGH (ref 70–99)

## 2023-12-07 NOTE — Plan of Care (Signed)
  Problem: Education: Goal: Knowledge of General Education information will improve Description: Including pain rating scale, medication(s)/side effects and non-pharmacologic comfort measures Outcome: Progressing   Problem: Health Behavior/Discharge Planning: Goal: Ability to manage health-related needs will improve Outcome: Progressing   Problem: Clinical Measurements: Goal: Ability to maintain clinical measurements within normal limits will improve Outcome: Progressing   Problem: Clinical Measurements: Goal: Ability to maintain clinical measurements within normal limits will improve Outcome: Progressing Goal: Will remain free from infection Outcome: Progressing Goal: Diagnostic test results will improve Outcome: Progressing Goal: Respiratory complications will improve Outcome: Progressing Goal: Cardiovascular complication will be avoided Outcome: Progressing   Problem: Activity: Goal: Risk for activity intolerance will decrease Outcome: Progressing   Problem: Nutrition: Goal: Adequate nutrition will be maintained Outcome: Progressing   Problem: Coping: Goal: Level of anxiety will decrease Outcome: Progressing   Problem: Elimination: Goal: Will not experience complications related to bowel motility Outcome: Progressing Goal: Will not experience complications related to urinary retention Outcome: Progressing   Problem: Safety: Goal: Ability to remain free from injury will improve Outcome: Progressing   Problem: Skin Integrity: Goal: Risk for impaired skin integrity will decrease Outcome: Progressing   Problem: Education: Goal: Ability to describe self-care measures that may prevent or decrease complications (Diabetes Survival Skills Education) will improve Outcome: Progressing Goal: Individualized Educational Video(s) Outcome: Progressing

## 2023-12-07 NOTE — Progress Notes (Signed)
 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 All nephrotoxic meds have been held. Renal signed off today.  Follow up with his nephrologist in [facility] as outpatient. HTN HLD BPH Dementia    FEN: CM diet VTE: hold SQH ID: no current abx   Dispo: Per son, planning admission to SNF ([facility] in [place] as soon as bed available.  Will defer to SW and CM.  Anticipate possible discharge tomorrow, 4/21.        Alex Billow, MD [facility] practice Office: [phone]        Chief Complaint: Fall, rib fx's  Subjective: Patient up to chair, recumbent.  Feels tired today.  Objective: Vital signs in last 24 hours: Temp:  [97.6 F (36.4 C)-98.6 F (37 C)] 98.2 F (36.8 C) (04/20 0953) Pulse Rate:  [79-87] 86 (04/20 0953) Resp:  [17-18] 18 (04/20 0953) BP: (151-165)/(50-97) 165/50 (04/20 0953) SpO2:  [91 %-98 %] 91 % (04/20 0953) Last BM Date : 12/06/23  Intake/Output from previous day: 04/19 0701 - 04/20 0700 In: 120 [P.O.:120] Out: 500 [Urine:500] Intake/Output this shift: Total I/O In: -  Out: 700 [Urine:700]  Physical Exam: HEENT - sclerae clear, mucous membranes moist Abdomen - soft without distension  Lab Results:  Recent Labs    12/05/23 0400  WBC 3.6*  HGB 7.2*  HCT 22.6*  PLT 101*   BMET Recent Labs    12/05/23 0400 12/06/23 0839  NA 136 136  K 4.3 4.1  CL 104 107  CO2 22 21*  GLUCOSE 252* 217*  BUN 53* 41*  CREATININE 2.26* 1.60*  CALCIUM  8.2* 8.7*   PT/INR No results for input(s): "LABPROT", "INR" in the last 72  hours. Comprehensive Metabolic Panel:    Component Value Date/Time   NA 136 12/06/2023 0839   NA 136 12/05/2023 0400   K 4.1 12/06/2023 0839   K 4.3 12/05/2023 0400   K 4.5 09/19/2014 1511   K 4.0 07/25/2014 1415   CL 107 12/06/2023 0839   CL 104 12/05/2023 0400   CO2 21 (L) 12/06/2023 0839   CO2 22 12/05/2023 0400   BUN 41 (H) 12/06/2023 0839   BUN 53 (H) 12/05/2023 0400   CREATININE 1.60 (H) 12/06/2023 0839   CREATININE 2.26 (H) 12/05/2023 0400   CREATININE 1.52 (H) 09/03/2023 1020   CREATININE 1.51 (H) 04/04/2023 1304   CREATININE 1.45 (H) 11/14/2017 1647   CREATININE 1.20 12/14/2014 1109   CREATININE 1.23 11/18/2014 0905   CREATININE 0.8 08/20/2013 0000   GLUCOSE 217 (H) 12/06/2023 0839   GLUCOSE 252 (H) 12/05/2023 0400   CALCIUM  8.7 (L) 12/06/2023 0839   CALCIUM  8.2 (L) 12/05/2023 0400   AST 17 11/05/2023 1013   AST 22 09/18/2023 0459   AST 16 09/03/2023 1020   AST 15 04/04/2023 1304   ALT 14 11/05/2023 1013   ALT 12 09/18/2023 0459   ALT 13 09/03/2023 1020   ALT 12 04/04/2023 1304   ALT 29 01/10/2012 1539   ALT 27 09/05/2011 1513   ALKPHOS 79  11/05/2023 1013   ALKPHOS 45 09/18/2023 0459   ALKPHOS 53 08/20/2013 0000   ALKPHOS 84 01/10/2012 1539   ALKPHOS 80 09/05/2011 1513   BILITOT 1.0 11/05/2023 1013   BILITOT 0.8 09/18/2023 0459   BILITOT 0.7 09/03/2023 1020   BILITOT 0.4 04/04/2023 1304   PROT 7.5 11/05/2023 1013   PROT 6.2 (L) 09/18/2023 0459   PROT 7.5 01/10/2012 1539   PROT 7.2 09/05/2011 1513   ALBUMIN 3.7 11/05/2023 1013   ALBUMIN 2.9 (L) 09/18/2023 0459   ALBUMIN 4.0 01/10/2012 1539   ALBUMIN 3.8 09/05/2011 1513    Studies/Results: No results found.    Alex Smith 12/07/2023  Patient ID: Alex Smith, male   DOB: 1935/01/03, 89 y.o.   MRN: [id number]

## 2023-12-07 NOTE — Plan of Care (Signed)
  Problem: Education: Goal: Knowledge of General Education information will improve Description: Including pain rating scale, medication(s)/side effects and non-pharmacologic comfort measures Outcome: Progressing   Problem: Clinical Measurements: Goal: Ability to maintain clinical measurements within normal limits will improve Outcome: Progressing   Problem: Coping: Goal: Level of anxiety will decrease Outcome: Progressing   

## 2023-12-08 LAB — BASIC METABOLIC PANEL WITH GFR
Anion gap: 8 (ref 5–15)
BUN: 25 mg/dL — ABNORMAL HIGH (ref 8–23)
CO2: 20 mmol/L — ABNORMAL LOW (ref 22–32)
Calcium: 8 mg/dL — ABNORMAL LOW (ref 8.9–10.3)
Chloride: 106 mmol/L (ref 98–111)
Creatinine, Ser: 1.41 mg/dL — ABNORMAL HIGH (ref 0.61–1.24)
GFR, Estimated: 48 mL/min — ABNORMAL LOW (ref >60)
Glucose, Bld: 240 mg/dL — ABNORMAL HIGH (ref 70–99)
Potassium: 3.7 mmol/L (ref 3.5–5.1)
Sodium: 134 mmol/L — ABNORMAL LOW (ref 135–145)

## 2023-12-08 LAB — GLUCOSE, CAPILLARY
Glucose-Capillary: 179 mg/dL — ABNORMAL HIGH (ref 70–99)
Glucose-Capillary: 285 mg/dL — ABNORMAL HIGH (ref 70–99)
Glucose-Capillary: 129 mg/dL — ABNORMAL HIGH (ref 70–99)
Glucose-Capillary: 109 mg/dL — ABNORMAL HIGH (ref 70–99)
Glucose-Capillary: 129 mg/dL — ABNORMAL HIGH (ref 70–99)

## 2023-12-08 MED ORDER — INSULIN GLARGINE-YFGN 100 UNIT/ML ~~LOC~~ SOLN
14.0000 [IU] | Freq: Every day | SUBCUTANEOUS | Status: DC
  Administered 2023-12-08: 14 [IU] via SUBCUTANEOUS
  Filled 2023-12-08 (×2): qty 0.14

## 2023-12-08 MED ORDER — INSULIN ASPART 100 UNIT/ML IJ SOLN
6.0000 [IU] | Freq: Three times a day (TID) | INTRAMUSCULAR | Status: DC
  Administered 2023-12-08 – 2023-12-09 (×3): 6 [IU] via SUBCUTANEOUS

## 2023-12-08 NOTE — Progress Notes (Signed)
 Subjective: CC: Left rib pain that he says is well controlled. Tolerating diet without n/v. Last BM yesterday. Voiding. Working with therapies.   Objective: Vital signs in last 24 hours: Temp:  [97.9 F (36.6 C)-98.7 F (37.1 C)] 97.9 F (36.6 C) (04/21 0754) Pulse Rate:  [72-85] 80 (04/21 0754) Resp:  [16-18] 16 (04/21 0535) BP: (138-160)/(67-72) 138/68 (04/21 0754) SpO2:  [84 %-93 %] 93 % (04/21 0754) Last BM Date : 12/07/23  Intake/Output from previous day: 04/20 0701 - 04/21 0700 In: 720 [P.O.:720] Out: 1350 [Urine:1350] Intake/Output this shift: No intake/output data recorded.  PE: Gen:  Alert, NAD, pleasant Card:  RRR Pulm:  CTAB, no W/R/R, effort normal Abd: Soft, ND, NT, +BS Ext:  No LE edema. L 2nd toe dressing cdi Psych: A&Ox3   Lab Results:  No results for input(s): "WBC", "HGB", "HCT", "PLT" in the last 72 hours. BMET Recent Labs    12/06/23 0839  NA 136  K 4.1  CL 107  CO2 21*  GLUCOSE 217*  BUN 41*  CREATININE 1.60*  CALCIUM  8.7*   PT/INR No results for input(s): "LABPROT", "INR" in the last 72 hours. CMP     Component Value Date/Time   NA 136 12/06/2023 0839   K 4.1 12/06/2023 0839   K 4.5 09/19/2014 1511   CL 107 12/06/2023 0839   CO2 21 (L) 12/06/2023 0839   GLUCOSE 217 (H) 12/06/2023 0839   BUN 41 (H) 12/06/2023 0839   CREATININE 1.60 (H) 12/06/2023 0839   CREATININE 1.52 (H) 09/03/2023 1020   CREATININE 1.45 (H) 11/14/2017 1647   CALCIUM  8.7 (L) 12/06/2023 0839   PROT 7.5 11/05/2023 1013   PROT 7.5 01/10/2012 1539   ALBUMIN 3.7 11/05/2023 1013   ALBUMIN 4.0 01/10/2012 1539   AST 17 11/05/2023 1013   AST 16 09/03/2023 1020   ALT 14 11/05/2023 1013   ALT 13 09/03/2023 1020   ALT 29 01/10/2012 1539   ALKPHOS 79 11/05/2023 1013   ALKPHOS 53 08/20/2013 0000   BILITOT 1.0 11/05/2023 1013   BILITOT 0.7 09/03/2023 1020   GFRNONAA 41 (L) 12/06/2023 0839   GFRNONAA 44 (L) 09/03/2023 1020   GFRNONAA 58 (L) 12/14/2014 1109    GFRAA 45 (L) 05/05/2020 1314   GFRAA >60 12/14/2014 1109   Lipase     Component Value Date/Time   LIPASE 19 08/13/2023 0614    Studies/Results: No results found.  Anti-infectives: Anti-infectives (From admission, onward)    None        Assessment/Plan Fall from standing  L 3-9 rib fractures with L pleural effusion - multimodal pain control, IS, pulm toilet Extrapleural hematoma - hgb stable on last check. Recheck CBC if any Hemodynamic changes.  Chronic Thrombocytopenia - Restart chem ppx when improved.  T2DM - SSI, increased semglee  14 u at bedtime and 6u novolog  for meal coverage per DM coordinator recs Chronic L 2nd toe infection - wound care ordered, f/u outpatient with podiatry AKI on CKD stage III - Cr 1.6 yesterday. Repeat pending today. All nephrotoxic meds have been held. Renal signed off today.  Follow up with his nephrologist in [facility] as outpatient. HTN - home meds.  HLD BPH Dementia    FEN: CM diet VTE: SCDs, as above.  ID: no current abx   Dispo: If Cr stable on today's labs, pt will be stable for d/c for SNF when bed available.   I reviewed nursing notes, last 24 h vitals and  pain scores, last 48 h intake and output, last 24 h labs and trends, and last 24 h imaging results.   LOS: 8 days    Delton Filbert, [facility] Surgery 12/08/2023, 12:37 PM Please see Amion for pager number during day hours 7:00am-4:30pm

## 2023-12-08 NOTE — TOC Progression Note (Signed)
 Transition of Care [facility]) - Progression Note    Patient Details  Name: Alex Smith MRN: [id number] Date of Birth: 08-Mar-1935  Transition of Care [facility]) CM/SW Contact  Radek Carnero, Avanell Leigh, RN Phone Number: 12/08/2023, 4:54 PM  Clinical Narrative:    Per PA, patient medically stable for discharge to SNF tomorrow; insurance auth good through 12/09/23.  Left voicemail for [facility] admissions, requesting confirmation for bed availability for tomorrow.  Will follow-up with [facility] to get authorization for [facility] transport.   Expected Discharge Plan: Skilled Nursing Facility Barriers to Discharge: Continued Medical Work up, SNF Pending bed offer  Expected Discharge Plan and Services     Post Acute Care Choice: Skilled Nursing Facility Living arrangements for the past 2 months: Assisted Living Facility ([facility]                                       Social Determinants of Health (SDOH) Interventions SDOH Screenings   Food Insecurity: No Food Insecurity (11/30/2023)  Housing: Low Risk  (11/30/2023)  Transportation Needs: No Transportation Needs (11/30/2023)  Utilities: Not At Risk (11/30/2023)  Depression (PHQ2-9): Medium Risk (03/09/2019)  Financial Resource Strain: Low Risk  (11/17/2023)   Received from [facility]  Social Connections: Socially Integrated (11/30/2023)  Tobacco Use: Medium Risk (12/01/2023)    Readmission Risk Interventions    04/13/2022    2:31 PM 08/19/2021   10:21 AM 05/11/2021   10:42 AM  Readmission Risk Prevention Plan  Transportation Screening Complete Complete Complete  PCP or Specialist Appt within 3-5 Days Complete    HRI or Home Care Consult Complete    Social Work Consult for Recovery Care Planning/Counseling Complete    Palliative Care Screening Complete    Medication Review Oceanographer) Complete Complete Complete  PCP or Specialist appointment within 3-5 days of discharge  Complete Complete  HRI or Home Care Consult  Complete  Complete  SW Recovery Care/Counseling Consult  Complete Complete  Palliative Care Screening  Not Applicable Complete  Skilled Nursing Facility  Complete Complete   Calla Catchings, RN, BSN  Trauma/Neuro ICU Case Manager [phone]

## 2023-12-08 NOTE — Progress Notes (Signed)
 Physical Therapy Treatment Patient Details Name: Alex Smith MRN: [id number] DOB: 02-Sep-1934 Today's Date: 12/08/2023   History of Present Illness Pt is an 89 y/o male presenting after a fall at his ALF resulting in L sided rib fractures (3-9), extrapleural hematoma near rib fx and L pleural effusion. PMHx: myelodysplasia, Alzheimer's dementia, hypertension, BPH, CKD stage IIIa, insulin -dependent diabetes.    PT Comments  Pt tolerates treatment well, more alert than previous PT session last week. Pt demonstrates a leftward lean initially when sitting and often has a strong posterior lean with transfer attempts. Pt fatigues quickly with ambulation and tends to displace the RW anteriorly with increased trunk flexion. Pt remains at a high risk for recurrent falls at this time. Patient will benefit from continued inpatient follow up therapy, <3 hours/day.    If plan is discharge home, recommend the following: Assistance with cooking/housework;A lot of help with bathing/dressing/bathroom;Direct supervision/assist for medications management;Direct supervision/assist for financial management;Assist for transportation;Supervision due to cognitive status;Help with stairs or ramp for entrance;A lot of help with walking and/or transfers   Can travel by private vehicle     Yes  Equipment Recommendations  None recommended by PT    Recommendations for Other Services       Precautions / Restrictions Precautions Precautions: Fall;Other (comment) Recall of Precautions/Restrictions: Impaired Precaution/Restrictions Comments: monitor O2 (does not wear at baseline) Required Braces or Orthoses: Other Brace Other Brace: L post op shoe Restrictions Weight Bearing Restrictions Per Provider Order: No Other Position/Activity Restrictions: but has been wearing a post op shoe to L foot to protect infected toe     Mobility  Bed Mobility Overal bed mobility: Needs Assistance Bed Mobility: Rolling, Sidelying  to Sit Rolling: Contact guard assist Sidelying to sit: Mod assist       General bed mobility comments: pt with strong left lean initially with bed mobiltiy and sitting    Transfers Overall transfer level: Needs assistance Equipment used: Rolling walker (2 wheels) Transfers: Sit to/from Stand Sit to Stand: Mod assist           General transfer comment: tendency for posterior lean when ascending from sitting to standing    Ambulation/Gait Ambulation/Gait assistance: Min assist Gait Distance (Feet): 30 Feet (additional trial of 20') Assistive device: Rolling walker (2 wheels) Gait Pattern/deviations: Step-to pattern, Trunk flexed Gait velocity: reduced Gait velocity interpretation: <1.31 ft/sec, indicative of household ambulator   General Gait Details: pt with slowed step-to gait, shuffling steps with increased trunk flexion over RW   Stairs             Wheelchair Mobility     Tilt Bed    Modified Rankin (Stroke Patients Only)       Balance Overall balance assessment: Needs assistance Sitting-balance support: Feet supported, Single extremity supported Sitting balance-Leahy Scale: Poor   Postural control: Left lateral lean Standing balance support: Bilateral upper extremity supported Standing balance-Leahy Scale: Poor Standing balance comment: minA                            Communication Communication Communication: No apparent difficulties  Cognition Arousal: Alert Behavior During Therapy: WFL for tasks assessed/performed   PT - Cognitive impairments: History of cognitive impairments                       PT - Cognition Comments: pt is alert and oriented to place and roughly to situation. He knows he  is at the hospital because he fell and broke his ribs Following commands: Impaired Following commands impaired: Follows multi-step commands inconsistently, Follows one step commands with increased time    Cueing Cueing  Techniques: Verbal cues  Exercises      General Comments General comments (skin integrity, edema, etc.): VSS on RA, sats at 91% after ambulation on room air      Pertinent Vitals/Pain Pain Assessment Pain Assessment: Faces Faces Pain Scale: Hurts little more Pain Location: back and ribs Pain Descriptors / Indicators: Aching Pain Intervention(s): Monitored during session    Home Living                          Prior Function            PT Goals (current goals can now be found in the care plan section) Acute Rehab PT Goals Patient Stated Goal: SNF then back to ALF Progress towards PT goals: Progressing toward goals    Frequency    Min 2X/week      PT Plan      Co-evaluation              AM-PAC PT "6 Clicks" Mobility   Outcome Measure  Help needed turning from your back to your side while in a flat bed without using bedrails?: A Little Help needed moving from lying on your back to sitting on the side of a flat bed without using bedrails?: A Lot Help needed moving to and from a bed to a chair (including a wheelchair)?: A Lot Help needed standing up from a chair using your arms (e.g., wheelchair or bedside chair)?: A Lot Help needed to walk in hospital room?: A Little Help needed climbing 3-5 steps with a railing? : Total 6 Click Score: 13    End of Session Equipment Utilized During Treatment: Gait belt Activity Tolerance: Patient tolerated treatment well Patient left: in chair;with call bell/phone within reach;with chair alarm set;with family/visitor present Nurse Communication: Mobility status PT Visit Diagnosis: Unsteadiness on feet (R26.81);Pain;Difficulty in walking, not elsewhere classified (R26.2) Pain - Right/Left: Left     Time: 9563-8756 PT Time Calculation (min) (ACUTE ONLY): 34 min  Charges:    $Gait Training: 8-22 mins $Therapeutic Activity: 8-22 mins PT General Charges $$ ACUTE PT VISIT: 1 Visit                     Rexie Catena, PT, DPT Acute Rehabilitation Office [phone]    Rexie Catena 12/08/2023, 10:32 AM

## 2023-12-08 NOTE — TOC Progression Note (Signed)
 Transition of Care [facility]) - Progression Note    Patient Details  Name: Alex Smith MRN: [id number] Date of Birth: 1934/09/19  Transition of Care [facility]) CM/SW Contact  Nadeem Romanoski E Patterson Hollenbaugh, LCSW Phone Number: 12/08/2023, 3:31 PM  Clinical Narrative:    Per Debria Fang at [facility], [name] is good for SNF until 4/22. Will need to submit for [facility] auth if needed.    Expected Discharge Plan: Skilled Nursing Facility Barriers to Discharge: Continued Medical Work up, SNF Pending bed offer  Expected Discharge Plan and Services     Post Acute Care Choice: Skilled Nursing Facility Living arrangements for the past 2 months: Assisted Living Facility ([facility]                                       Social Determinants of Health (SDOH) Interventions SDOH Screenings   Food Insecurity: No Food Insecurity (11/30/2023)  Housing: Low Risk  (11/30/2023)  Transportation Needs: No Transportation Needs (11/30/2023)  Utilities: Not At Risk (11/30/2023)  Depression (PHQ2-9): Medium Risk (03/09/2019)  Financial Resource Strain: Low Risk  (11/17/2023)   Received from [facility]  Social Connections: Socially Integrated (11/30/2023)  Tobacco Use: Medium Risk (12/01/2023)    Readmission Risk Interventions    04/13/2022    2:31 PM 08/19/2021   10:21 AM 05/11/2021   10:42 AM  Readmission Risk Prevention Plan  Transportation Screening Complete Complete Complete  PCP or Specialist Appt within 3-5 Days Complete    HRI or Home Care Consult Complete    Social Work Consult for Recovery Care Planning/Counseling Complete    Palliative Care Screening Complete    Medication Review Oceanographer) Complete Complete Complete  PCP or Specialist appointment within 3-5 days of discharge  Complete Complete  HRI or Home Care Consult  Complete Complete  SW Recovery Care/Counseling Consult  Complete Complete  Palliative Care Screening  Not Applicable Complete  Skilled Nursing Facility  Complete Complete

## 2023-12-08 NOTE — Plan of Care (Signed)

## 2023-12-09 DIAGNOSIS — D46Z Other myelodysplastic syndromes: Secondary | ICD-10-CM | POA: Diagnosis present

## 2023-12-09 DIAGNOSIS — L97522 Non-pressure chronic ulcer of other part of left foot with fat layer exposed: Secondary | ICD-10-CM | POA: Diagnosis not present

## 2023-12-09 DIAGNOSIS — G309 Alzheimer's disease, unspecified: Secondary | ICD-10-CM | POA: Diagnosis not present

## 2023-12-09 DIAGNOSIS — S2242XA Multiple fractures of ribs, left side, initial encounter for closed fracture: Secondary | ICD-10-CM | POA: Diagnosis not present

## 2023-12-09 DIAGNOSIS — E1122 Type 2 diabetes mellitus with diabetic chronic kidney disease: Secondary | ICD-10-CM | POA: Diagnosis not present

## 2023-12-09 DIAGNOSIS — S0001XD Abrasion of scalp, subsequent encounter: Secondary | ICD-10-CM | POA: Diagnosis not present

## 2023-12-09 DIAGNOSIS — Z5111 Encounter for antineoplastic chemotherapy: Secondary | ICD-10-CM | POA: Diagnosis present

## 2023-12-09 DIAGNOSIS — Z741 Need for assistance with personal care: Secondary | ICD-10-CM | POA: Diagnosis not present

## 2023-12-09 DIAGNOSIS — K219 Gastro-esophageal reflux disease without esophagitis: Secondary | ICD-10-CM | POA: Diagnosis not present

## 2023-12-09 DIAGNOSIS — D649 Anemia, unspecified: Secondary | ICD-10-CM | POA: Diagnosis not present

## 2023-12-09 DIAGNOSIS — E1140 Type 2 diabetes mellitus with diabetic neuropathy, unspecified: Secondary | ICD-10-CM | POA: Diagnosis not present

## 2023-12-09 DIAGNOSIS — N1831 Chronic kidney disease, stage 3a: Secondary | ICD-10-CM | POA: Diagnosis not present

## 2023-12-09 DIAGNOSIS — N400 Enlarged prostate without lower urinary tract symptoms: Secondary | ICD-10-CM | POA: Diagnosis not present

## 2023-12-09 DIAGNOSIS — F5101 Primary insomnia: Secondary | ICD-10-CM | POA: Diagnosis not present

## 2023-12-09 DIAGNOSIS — M6281 Muscle weakness (generalized): Secondary | ICD-10-CM | POA: Diagnosis not present

## 2023-12-09 DIAGNOSIS — R059 Cough, unspecified: Secondary | ICD-10-CM | POA: Diagnosis not present

## 2023-12-09 DIAGNOSIS — E1165 Type 2 diabetes mellitus with hyperglycemia: Secondary | ICD-10-CM | POA: Diagnosis not present

## 2023-12-09 DIAGNOSIS — F32A Depression, unspecified: Secondary | ICD-10-CM | POA: Diagnosis not present

## 2023-12-09 DIAGNOSIS — M6259 Muscle wasting and atrophy, not elsewhere classified, multiple sites: Secondary | ICD-10-CM | POA: Diagnosis not present

## 2023-12-09 DIAGNOSIS — D696 Thrombocytopenia, unspecified: Secondary | ICD-10-CM | POA: Diagnosis not present

## 2023-12-09 DIAGNOSIS — R451 Restlessness and agitation: Secondary | ICD-10-CM | POA: Diagnosis not present

## 2023-12-09 DIAGNOSIS — E785 Hyperlipidemia, unspecified: Secondary | ICD-10-CM | POA: Diagnosis not present

## 2023-12-09 DIAGNOSIS — S20222A Contusion of left back wall of thorax, initial encounter: Secondary | ICD-10-CM | POA: Diagnosis not present

## 2023-12-09 DIAGNOSIS — I10 Essential (primary) hypertension: Secondary | ICD-10-CM | POA: Diagnosis not present

## 2023-12-09 DIAGNOSIS — Z794 Long term (current) use of insulin: Secondary | ICD-10-CM | POA: Diagnosis not present

## 2023-12-09 DIAGNOSIS — W19XXXD Unspecified fall, subsequent encounter: Secondary | ICD-10-CM | POA: Diagnosis not present

## 2023-12-09 DIAGNOSIS — M86172 Other acute osteomyelitis, left ankle and foot: Secondary | ICD-10-CM | POA: Diagnosis not present

## 2023-12-09 DIAGNOSIS — N183 Chronic kidney disease, stage 3 unspecified: Secondary | ICD-10-CM | POA: Diagnosis not present

## 2023-12-09 DIAGNOSIS — E569 Vitamin deficiency, unspecified: Secondary | ICD-10-CM | POA: Diagnosis not present

## 2023-12-09 DIAGNOSIS — Z7401 Bed confinement status: Secondary | ICD-10-CM | POA: Diagnosis not present

## 2023-12-09 DIAGNOSIS — R531 Weakness: Secondary | ICD-10-CM | POA: Diagnosis not present

## 2023-12-09 DIAGNOSIS — S2242XD Multiple fractures of ribs, left side, subsequent encounter for fracture with routine healing: Secondary | ICD-10-CM | POA: Diagnosis not present

## 2023-12-09 DIAGNOSIS — J90 Pleural effusion, not elsewhere classified: Secondary | ICD-10-CM | POA: Diagnosis not present

## 2023-12-09 DIAGNOSIS — Z86008 Personal history of in-situ neoplasm of other site: Secondary | ICD-10-CM | POA: Diagnosis not present

## 2023-12-09 DIAGNOSIS — R2681 Unsteadiness on feet: Secondary | ICD-10-CM | POA: Diagnosis not present

## 2023-12-09 LAB — GLUCOSE, CAPILLARY
Glucose-Capillary: 177 mg/dL — ABNORMAL HIGH (ref 70–99)
Glucose-Capillary: 171 mg/dL — ABNORMAL HIGH (ref 70–99)

## 2023-12-09 MED ORDER — OXYCODONE HCL 5 MG PO TABS
2.5000 mg | ORAL_TABLET | ORAL | 0 refills | Status: DC | PRN
Start: 2023-12-09 — End: 2023-12-09

## 2023-12-09 MED ORDER — OXYCODONE HCL 5 MG PO TABS: 2.5000 mg | ORAL_TABLET | ORAL | 0 refills | Status: DC | PRN

## 2023-12-09 NOTE — Discharge Planning (Signed)
 Patient alert. IV access removed. Discharge teaching given to [facility]. Discharge summary placed in discharge packet. Patient will be transported via [facility].

## 2023-12-09 NOTE — TOC Transition Note (Addendum)
 Transition of Care [facility]) - Discharge Note   Patient Details  Name: Alex Smith MRN: [id number] Date of Birth: [birth year]  Transition of Care [facility]) CM/SW Contact:  Jennaya Pogue E Dajsha Massaro, LCSW Phone Number: 12/09/2023, 11:53 AM   Clinical Narrative:    Notified son by phone and patient/daughter at bedside of [facility] to [facility]. EMS packet placed with chart. Called [facility], arranged transport, patient is 3rd on the list for transport.    Final next level of care: Skilled Nursing Facility Barriers to Discharge: Barriers Resolved   Patient Goals and CMS Choice   CMS Medicare.gov Compare Post Acute Care list provided to::  (daughter Mariah Shines) Choice offered to / list presented to : Adult Children (daughter Mariah Shines)      Discharge Placement              Patient chooses bed at: [facility] [place] Patient to be transferred to facility by: [facility] Name of family member notified: Keelyn, son Patient and family notified of of transfer: 12/09/23  Discharge Plan and Services Additional resources added to the After Visit Summary for       Post Acute Care Choice: Skilled Nursing Facility                               Social Drivers of Health (SDOH) Interventions SDOH Screenings   Food Insecurity: No Food Insecurity (11/30/2023)  Housing: Low Risk  (11/30/2023)  Transportation Needs: No Transportation Needs (11/30/2023)  Utilities: Not At Risk (11/30/2023)  Depression (PHQ2-9): Medium Risk (03/09/2019)  Financial Resource Strain: Low Risk  (11/17/2023)   Received from [facility]  Social Connections: Socially Integrated (11/30/2023)  Tobacco Use: Medium Risk (12/01/2023)     Readmission Risk Interventions    04/13/2022    2:31 PM 08/19/2021   10:21 AM 05/11/2021   10:42 AM  Readmission Risk Prevention Plan  Transportation Screening Complete Complete Complete  PCP or Specialist Appt within 3-5 Days Complete    HRI or Home Care Consult Complete    Social Work Consult for  Recovery Care Planning/Counseling Complete    Palliative Care Screening Complete    Medication Review Oceanographer) Complete Complete Complete  PCP or Specialist appointment within 3-5 days of discharge  Complete Complete  HRI or Home Care Consult  Complete Complete  SW Recovery Care/Counseling Consult  Complete Complete  Palliative Care Screening  Not Applicable Complete  Skilled Nursing Facility  Complete Complete

## 2023-12-09 NOTE — TOC Transition Note (Addendum)
 Transition of Care [facility]) - Discharge Note   Patient Details  Name: Alex Smith MRN: [id number] Date of Birth: 1935-03-10  Transition of Care [facility]) CM/SW Contact:  Manuela Halbur M, RN Phone Number: 12/09/2023, 9:42 AM   Clinical Narrative:    Patient medically stable for dc today, per provider.  Plan dc to [facility], per pt/family choice, and pt/son agreeable to plan. Notified Debria Fang at [employer] for transportation auth via [place].   Patient will dc to room 701; Bedside nurse will need to call report to [phone], and ask for [facility] Nurse.   Addendum:  HTA Auth received from [place]; Auth # is [id number] and is good for 90 days. Forwarded discharge summary and transfer report to facility.    Final next level of care: Skilled Nursing Facility Barriers to Discharge: Barriers Resolved   Patient Goals and CMS Choice   CMS Medicare.gov Compare Post Acute Care list provided to::  (daughter Mariah Shines) Choice offered to / list presented to : Adult Children (daughter Mariah Shines)      Discharge Placement              Patient chooses bed at: [facility] [place] Patient to be transferred to facility by: [facility] Name of family member notified: [place], son Patient and family notified of of transfer: 12/09/23  Discharge Plan and Services Additional resources added to the After Visit Summary for       Post Acute Care Choice: Skilled Nursing Facility                               Social Drivers of Health (SDOH) Interventions SDOH Screenings   Food Insecurity: No Food Insecurity (11/30/2023)  Housing: Low Risk  (11/30/2023)  Transportation Needs: No Transportation Needs (11/30/2023)  Utilities: Not At Risk (11/30/2023)  Depression (PHQ2-9): Medium Risk (03/09/2019)  Financial Resource Strain: Low Risk  (11/17/2023)   Received from [facility]  Social Connections: Socially Integrated (11/30/2023)  Tobacco Use: Medium Risk (12/01/2023)     Readmission Risk Interventions     04/13/2022    2:31 PM 08/19/2021   10:21 AM 05/11/2021   10:42 AM  Readmission Risk Prevention Plan  Transportation Screening Complete Complete Complete  PCP or Specialist Appt within 3-5 Days Complete    HRI or Home Care Consult Complete    Social Work Consult for Recovery Care Planning/Counseling Complete    Palliative Care Screening Complete    Medication Review Oceanographer) Complete Complete Complete  PCP or Specialist appointment within 3-5 days of discharge  Complete Complete  HRI or Home Care Consult  Complete Complete  SW Recovery Care/Counseling Consult  Complete Complete  Palliative Care Screening  Not Applicable Complete  Skilled Nursing Facility  Complete Complete    Calla Catchings, RN, BSN  Trauma/Neuro ICU Case Manager [phone]

## 2023-12-09 NOTE — Plan of Care (Signed)
  Problem: Clinical Measurements: Goal: Cardiovascular complication will be avoided Outcome: Not Progressing   Problem: Activity: Goal: Risk for activity intolerance will decrease Outcome: Not Progressing   Problem: Safety: Goal: Ability to remain free from injury will improve Outcome: Not Progressing   Problem: Skin Integrity: Goal: Risk for impaired skin integrity will decrease Outcome: Not Progressing

## 2023-12-10 DIAGNOSIS — N400 Enlarged prostate without lower urinary tract symptoms: Secondary | ICD-10-CM | POA: Diagnosis not present

## 2023-12-11 ENCOUNTER — Ambulatory Visit: Admitting: Podiatry

## 2023-12-11 DIAGNOSIS — S2242XA Multiple fractures of ribs, left side, initial encounter for closed fracture: Secondary | ICD-10-CM | POA: Diagnosis not present

## 2023-12-11 DIAGNOSIS — I10 Essential (primary) hypertension: Secondary | ICD-10-CM | POA: Diagnosis not present

## 2023-12-11 DIAGNOSIS — K219 Gastro-esophageal reflux disease without esophagitis: Secondary | ICD-10-CM | POA: Diagnosis not present

## 2023-12-11 DIAGNOSIS — G309 Alzheimer's disease, unspecified: Secondary | ICD-10-CM | POA: Diagnosis not present

## 2023-12-12 DIAGNOSIS — I10 Essential (primary) hypertension: Secondary | ICD-10-CM | POA: Diagnosis not present

## 2023-12-12 DIAGNOSIS — G309 Alzheimer's disease, unspecified: Secondary | ICD-10-CM | POA: Diagnosis not present

## 2023-12-12 DIAGNOSIS — M6281 Muscle weakness (generalized): Secondary | ICD-10-CM | POA: Diagnosis not present

## 2023-12-14 DIAGNOSIS — R451 Restlessness and agitation: Secondary | ICD-10-CM | POA: Diagnosis not present

## 2023-12-16 ENCOUNTER — Ambulatory Visit: Admitting: Podiatry

## 2023-12-16 DIAGNOSIS — N183 Chronic kidney disease, stage 3 unspecified: Secondary | ICD-10-CM

## 2023-12-16 DIAGNOSIS — E1122 Type 2 diabetes mellitus with diabetic chronic kidney disease: Secondary | ICD-10-CM | POA: Diagnosis not present

## 2023-12-16 DIAGNOSIS — L97522 Non-pressure chronic ulcer of other part of left foot with fat layer exposed: Secondary | ICD-10-CM | POA: Diagnosis not present

## 2023-12-16 DIAGNOSIS — Z794 Long term (current) use of insulin: Secondary | ICD-10-CM

## 2023-12-16 DIAGNOSIS — G309 Alzheimer's disease, unspecified: Secondary | ICD-10-CM | POA: Diagnosis not present

## 2023-12-16 DIAGNOSIS — R451 Restlessness and agitation: Secondary | ICD-10-CM | POA: Diagnosis not present

## 2023-12-16 DIAGNOSIS — I10 Essential (primary) hypertension: Secondary | ICD-10-CM | POA: Diagnosis not present

## 2023-12-16 NOTE — Progress Notes (Signed)
 Subjective:  Patient ID: Alex Smith, male    DOB: [birth year],  MRN: [id number]  Chief Complaint  Patient presents with   Foot Ulcer    89 y.o. male presents with the above complaint.  Patient presents with left second digit ulceration that is improving.  He states that Betadine wet-to-dry dressing has helped.  He is at a nursing facility.  Pain scale is 5 out of 10 dull aching nature.  He would like to return to nursing facility and continue doing local wound care.  Review of Systems: Negative except as noted in the HPI. Denies N/V/F/Ch.  Past Medical History:  Diagnosis Date   Actinic keratosis    Alzheimer's dementia (HCC) 08/18/2021   Anemia    B12 deficiency    Basal cell carcinoma 03/30/2018   left ant nasal ala   Basal cell carcinoma 06/25/2023   R cheek, schedule for [facility]   Basal cell carcinoma of face 01/19/2013   right distal dorsum nose   BPH (benign prostatic hypertrophy)    followed by urology, discharged (Dr. 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 pretibial. 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 EDC 09/07/2020   Squamous cell carcinoma of skin 04/03/2020   Left cheek. WD SCC   Systolic murmur 05/28/2016   Type 2 diabetes, controlled, with retinopathy (HCC)     Current Outpatient Medications:    acetaminophen  (TYLENOL ) 500 MG tablet, Take 2 tablets (1,000 mg total) by mouth 2 (two) times daily.,  Disp: 120 tablet, Rfl: 0   albuterol  (VENTOLIN  HFA) 108 (90 Base) MCG/ACT inhaler, Inhale 2 puffs into the lungs every 6 (six) hours as needed for wheezing or shortness of breath., Disp: , Rfl:    alum & mag hydroxide-simeth (MAALOX/MYLANTA) 200-200-20 MG/5ML suspension, Take 30 mLs by mouth every 4 (four) hours as needed for indigestion or heartburn. (Patient taking differently: Take 30 mLs by mouth 4 (four) times daily as needed for indigestion or heartburn.), Disp: 355 mL, Rfl: 0   amLODipine  (NORVASC ) 10 MG tablet, Take 1 tablet (10 mg total) by mouth daily., Disp: , Rfl:    amoxicillin-clavulanate (AUGMENTIN) 875-125 MG tablet, Take 1 tablet by mouth 2 (two) times daily., Disp: , Rfl:    aspirin  EC 81 MG tablet, Take 81 mg by mouth daily., Disp: , Rfl:    donepezil  (ARICEPT ) 5 MG tablet, Take 5 mg by mouth daily., Disp: , Rfl:    doxycycline (ADOXA) 100 MG tablet, Take 100 mg by mouth 2 (two) times daily., Disp: , Rfl:    famotidine  (PEPCID ) 20 MG tablet, Take 20 mg by mouth daily., Disp: , Rfl:    guaiFENesin  (GERI-TUSSIN) 100 MG/5ML liquid, Take 15 mLs by mouth every 6 (six) hours as needed for cough., Disp: , Rfl:    insulin  glargine (LANTUS ) 100 UNIT/ML Solostar Pen, Inject  14 Units into the skin at bedtime. (Patient taking differently: Inject 8 Units into the skin at bedtime.), Disp: , Rfl:    insulin  lispro (HUMALOG ) 100 UNIT/ML KwikPen, Inject 2 Units into the skin 3 (three) times daily before meals. Per sliding scale (Patient taking differently: Inject 7 Units into the skin 3 (three) times daily before meals.), Disp: 15 mL, Rfl: 11   loperamide  (IMODIUM  A-D) 2 MG tablet, Take 4 mg by mouth See admin instructions. Give 2 tablets (4mg ) by mouth after each loose stool as needed. Limit 8 tablets in 24 hours., Disp: , Rfl:    [Paused] losartan  (COZAAR ) 25 MG tablet, Take 1 tablet (25 mg total) by mouth daily., Disp: 30 tablet, Rfl: 0   Luspatercept -aamt (REBLOZYL  Encinal), Inject 100 mg into the  skin See admin instructions. Infuse 100mg  into vein, every 3 weeks at oncology office., Disp: , Rfl:    magnesium  hydroxide (MILK OF MAGNESIA) 400 MG/5ML suspension, Take 30 mLs by mouth daily as needed (constipation)., Disp: , Rfl:    Melatonin 10 MG TABS, Take 10 mg by mouth at bedtime., Disp: , Rfl:    Multiple Vitamin (MULTIVITAMIN WITH MINERALS) TABS tablet, Take 1 tablet by mouth daily., Disp: , Rfl:    oxyCODONE  (OXY IR/ROXICODONE ) 5 MG immediate release tablet, Take 0.5-1 tablets (2.5-5 mg total) by mouth every 4 (four) hours as needed for moderate pain (pain score 4-6) or severe pain (pain score 7-10) (2.5 mg for moderate, 5 mg for severe)., Disp: 10 tablet, Rfl: 0   pantoprazole  (PROTONIX ) 40 MG tablet, Take 1 tablet (40 mg total) by mouth 2 (two) times daily., Disp: , Rfl:    pravastatin  (PRAVACHOL ) 20 MG tablet, Take 20 mg by mouth at bedtime., Disp: , Rfl:    sertraline  (ZOLOFT ) 25 MG tablet, Take 25 mg by mouth daily., Disp: , Rfl:    tamsulosin  (FLOMAX ) 0.4 MG CAPS capsule, TAKE 1 CAPSULE BY MOUTH EVERY DAY (Patient taking differently: Take 0.4 mg by mouth at bedtime.), Disp: 30 capsule, Rfl: 3   [Paused] torsemide  (DEMADEX ) 20 MG tablet, Take 20 mg by mouth daily., Disp: , Rfl:    triamcinolone cream (KENALOG) 0.1 %, Apply 1 Application topically 2 (two) times daily. Apply liberally to bilateral lower legs., Disp: , Rfl:    UNABLE TO FIND, Apply 1 application  topically daily. Remove dressing and cleanse the foot, toes and wound with Vashe or saline or distilled water. Put Betadine on gauze and place on wound. Cover it with dry gauze then ABD. Place a dry 4x4 gauze on anterior ankle to protect it. Wrap with Kerlix then loose ACE. Secure with tape. Do this daily for the left 2nd toe., Disp: , Rfl:    vitamin B-12 (CYANOCOBALAMIN ) 1000 MCG tablet, Take 1 tablet (1,000 mcg total) by mouth daily., Disp: 90 tablet, Rfl: 3   vitamin E  400 UNIT capsule, Take 400 Units by mouth daily., Disp: ,  Rfl:  No current facility-administered medications for this visit.  Facility-Administered Medications Ordered in Other Visits:    heparin  lock flush 100 unit/mL, 500 Units, Intravenous, Once, Corcoran, Melissa C, MD   luspatercept -aamt (REBLOZYL ) subcutaneous injection 100 mg, 100 mg, Subcutaneous, Q21 days, Avonne Boettcher, MD, 100 mg at 01/09/23 1428   sodium chloride  0.9 % injection 10 mL, 10 mL, Intravenous, PRN, Corcoran, Melissa C, MD, 10 mL at 03/03/15 0903   sodium chloride  0.9 % injection 10 mL, 10 mL, Intracatheter, PRN, Corcoran, Melissa C, MD, 10 mL  at 03/10/15 1410   sodium chloride  flush (NS) 0.9 % injection 10 mL, 10 mL, Intravenous, PRN, Beverely Buba, Melissa C, MD, 10 mL at 07/23/18 1517  Social History   Tobacco Use  Smoking Status Former   Current packs/day: 0.00   Average packs/day: 2.0 packs/day for 25.0 years (50.0 ttl pk-yrs)   Types: Cigarettes   Start date: 02/07/1944   Quit date: 02/06/1969   Years since quitting: 54.8  Smokeless Tobacco Never  Tobacco Comments   07/07/2012 "stopped smoking ~ 40 yr ago; smoked 20-74yr"    Allergies  Allergen Reactions   Rituxan [Rituximab] Rash and Other (See Comments)    Chest tightness    Blood-Group Specific Substance Other (See Comments)    Had a post transfusion reaction of red blood cells; NOW REQUIRES WASHED BLOOD CELLS   Primaxin Iv [Imipenem-Cilastatin] Other (See Comments)    Unknown reaction   Primaxin [Imipenem] Other (See Comments)    Unknown reaction Tolerates cephalosporins   Vfend [Voriconazole] Other (See Comments)    Unknown reaction   Sulfa Antibiotics Itching and Rash   Sulfacetamide Sodium Itching and Rash   Objective:  There were no vitals filed for this visit. There is no height or weight on file to calculate BMI. Constitutional Well developed. Well nourished.  Vascular Dorsalis pedis pulses palpable bilaterally. Posterior tibial pulses palpable bilaterally. Capillary refill normal to all  digits.  No cyanosis or clubbing noted. Pedal hair growth normal.  Neurologic Normal speech. Oriented to person, place, and time. Epicritic sensation to light touch grossly present bilaterally.  Dermatologic Nails well groomed and normal in appearance. No open wounds. No skin lesions.  Orthopedic: Left second digit ulceration probing down to deep bone.  No purulent drainage noted no erythema noted.  Some edema noted around the second toe.  No other signs of infection noted   Radiographs: None Assessment:   No diagnosis found.  Plan:  Patient was evaluated and treated and all questions answered.  Left second digit ulceration fat layer exposed probing down to bone - All questions and concerns were discussed with the patient in extensive detail.  The depth and the color of the second digit is improving.  At this time we will continue antibiotic therapy with Betadine wet-to-dry dressing.  I discussed with the patient extensively he states understanding.  There is an improvement of the ulceration.  Does not probe down to bone as easily.

## 2023-12-17 ENCOUNTER — Inpatient Hospital Stay: Payer: PPO

## 2023-12-17 DIAGNOSIS — E1140 Type 2 diabetes mellitus with diabetic neuropathy, unspecified: Secondary | ICD-10-CM | POA: Diagnosis not present

## 2023-12-17 DIAGNOSIS — I10 Essential (primary) hypertension: Secondary | ICD-10-CM | POA: Diagnosis not present

## 2023-12-17 DIAGNOSIS — E1165 Type 2 diabetes mellitus with hyperglycemia: Secondary | ICD-10-CM | POA: Diagnosis not present

## 2023-12-17 DIAGNOSIS — M86172 Other acute osteomyelitis, left ankle and foot: Secondary | ICD-10-CM | POA: Diagnosis not present

## 2023-12-19 DIAGNOSIS — G309 Alzheimer's disease, unspecified: Secondary | ICD-10-CM | POA: Diagnosis not present

## 2023-12-19 DIAGNOSIS — I10 Essential (primary) hypertension: Secondary | ICD-10-CM | POA: Diagnosis not present

## 2023-12-19 DIAGNOSIS — M6281 Muscle weakness (generalized): Secondary | ICD-10-CM | POA: Diagnosis not present

## 2023-12-22 DIAGNOSIS — G309 Alzheimer's disease, unspecified: Secondary | ICD-10-CM | POA: Diagnosis not present

## 2023-12-22 DIAGNOSIS — S0001XD Abrasion of scalp, subsequent encounter: Secondary | ICD-10-CM | POA: Diagnosis not present

## 2023-12-25 ENCOUNTER — Telehealth: Payer: Self-pay | Admitting: Podiatry

## 2023-12-25 NOTE — Telephone Encounter (Signed)
 Rehab facility called and I read her Dr Basilio Both message regarding wearing the boot.

## 2023-12-25 NOTE — Telephone Encounter (Signed)
 Patient's daughter called and would like to know if it's okay for the patient to discontinue wearing his surgical shoe. He is at a rehab facility due to a recent fall, and it is causing him to trip when he walks. Her phone number is [phone].

## 2023-12-26 ENCOUNTER — Other Ambulatory Visit: Payer: Self-pay

## 2023-12-26 DIAGNOSIS — I10 Essential (primary) hypertension: Secondary | ICD-10-CM | POA: Diagnosis not present

## 2023-12-26 DIAGNOSIS — E1165 Type 2 diabetes mellitus with hyperglycemia: Secondary | ICD-10-CM | POA: Diagnosis not present

## 2023-12-26 DIAGNOSIS — D46Z Other myelodysplastic syndromes: Secondary | ICD-10-CM

## 2023-12-29 ENCOUNTER — Inpatient Hospital Stay

## 2023-12-29 ENCOUNTER — Inpatient Hospital Stay: Attending: Nurse Practitioner

## 2023-12-29 VITALS — BP 147/61

## 2023-12-29 DIAGNOSIS — Z5111 Encounter for antineoplastic chemotherapy: Secondary | ICD-10-CM | POA: Diagnosis present

## 2023-12-29 DIAGNOSIS — D46Z Other myelodysplastic syndromes: Secondary | ICD-10-CM

## 2023-12-29 DIAGNOSIS — I10 Essential (primary) hypertension: Secondary | ICD-10-CM | POA: Diagnosis not present

## 2023-12-29 DIAGNOSIS — R059 Cough, unspecified: Secondary | ICD-10-CM | POA: Diagnosis not present

## 2023-12-29 LAB — CBC WITH DIFFERENTIAL (CANCER CENTER ONLY)
Abs Immature Granulocytes: 0.01 10*3/uL (ref 0.00–0.07)
Basophils Absolute: 0 10*3/uL (ref 0.0–0.1)
Basophils Relative: 0 %
Eosinophils Absolute: 0.1 10*3/uL (ref 0.0–0.5)
Eosinophils Relative: 3 %
HCT: 25.9 % — ABNORMAL LOW (ref 39.0–52.0)
Hemoglobin: 8 g/dL — ABNORMAL LOW (ref 13.0–17.0)
Immature Granulocytes: 0 %
Lymphocytes Relative: 19 %
Lymphs Abs: 0.7 10*3/uL (ref 0.7–4.0)
MCH: 34.5 pg — ABNORMAL HIGH (ref 26.0–34.0)
MCHC: 30.9 g/dL (ref 30.0–36.0)
MCV: 111.6 fL — ABNORMAL HIGH (ref 80.0–100.0)
Monocytes Absolute: 0.2 10*3/uL (ref 0.1–1.0)
Monocytes Relative: 4 %
Neutro Abs: 2.8 10*3/uL (ref 1.7–7.7)
Neutrophils Relative %: 74 %
Platelet Count: 115 10*3/uL — ABNORMAL LOW (ref 150–400)
RBC: 2.32 MIL/uL — ABNORMAL LOW (ref 4.22–5.81)
RDW: 18.2 % — ABNORMAL HIGH (ref 11.5–15.5)
WBC Count: 3.8 10*3/uL — ABNORMAL LOW (ref 4.0–10.5)
nRBC: 0.5 % — ABNORMAL HIGH (ref 0.0–0.2)

## 2023-12-29 MED ORDER — LUSPATERCEPT-AAMT 75 MG ~~LOC~~ SOLR
100.0000 mg | SUBCUTANEOUS | Status: DC
  Administered 2023-12-29: 100 mg via SUBCUTANEOUS
  Filled 2023-12-29: qty 1.5

## 2023-12-29 NOTE — Progress Notes (Signed)
   Established Patient Office Visit  Subjective   Patient ID: Alex Smith, male    DOB: 07-Feb-1935  Age: 89 y.o. MRN: [id number]  No chief complaint on file.   HPI    ROS    Objective:      There were no vitals taken for this visit.   Physical Exam   Results for orders placed or performed in visit on 12/29/23  CBC with Differential (Cancer Center Only)  Result Value Ref Range   WBC Count 3.8 (L) 4.0 - 10.5 K/uL   RBC 2.32 (L) 4.22 - 5.81 MIL/uL   Hemoglobin 8.0 (L) 13.0 - 17.0 g/dL   HCT 72.5 (L) 36.6 - 44.0 %   MCV 111.6 (H) 80.0 - 100.0 fL   MCH 34.5 (H) 26.0 - 34.0 pg   MCHC 30.9 30.0 - 36.0 g/dL   RDW 34.7 (H) 42.5 - 95.6 %   Platelet Count 115 (L) 150 - 400 K/uL   nRBC 0.5 (H) 0.0 - 0.2 %   Neutrophils Relative % 74 %   Neutro Abs 2.8 1.7 - 7.7 K/uL   Lymphocytes Relative 19 %   Lymphs Abs 0.7 0.7 - 4.0 K/uL   Monocytes Relative 4 %   Monocytes Absolute 0.2 0.1 - 1.0 K/uL   Eosinophils Relative 3 %   Eosinophils Absolute 0.1 0.0 - 0.5 K/uL   Basophils Relative 0 %   Basophils Absolute 0.0 0.0 - 0.1 K/uL   Immature Granulocytes 0 %   Abs Immature Granulocytes 0.01 0.00 - 0.07 K/uL      The ASCVD Risk score (Arnett DK, et al., 2019) failed to calculate for the following reasons:   The 2019 ASCVD risk score is only valid for ages 79 to 14    Assessment & Plan:   Problem List Items Addressed This Visit     Myelodysplasia present in bone marrow (HCC) - Primary   Relevant Medications   luspatercept -aamt (REBLOZYL ) subcutaneous injection 100 mg (Start on 12/29/2023 11:15 AM)    No follow-ups on file.    Salem Crater, CMA

## 2023-12-30 DIAGNOSIS — J90 Pleural effusion, not elsewhere classified: Secondary | ICD-10-CM | POA: Diagnosis not present

## 2023-12-31 ENCOUNTER — Ambulatory Visit

## 2024-01-01 ENCOUNTER — Ambulatory Visit: Payer: PPO | Admitting: Dermatology

## 2024-01-01 DIAGNOSIS — G309 Alzheimer's disease, unspecified: Secondary | ICD-10-CM | POA: Diagnosis not present

## 2024-01-01 DIAGNOSIS — I10 Essential (primary) hypertension: Secondary | ICD-10-CM | POA: Diagnosis not present

## 2024-01-01 DIAGNOSIS — S2242XA Multiple fractures of ribs, left side, initial encounter for closed fracture: Secondary | ICD-10-CM | POA: Diagnosis not present

## 2024-01-01 DIAGNOSIS — M6281 Muscle weakness (generalized): Secondary | ICD-10-CM | POA: Diagnosis not present

## 2024-01-07 DIAGNOSIS — E1165 Type 2 diabetes mellitus with hyperglycemia: Secondary | ICD-10-CM | POA: Diagnosis not present

## 2024-01-07 DIAGNOSIS — C9591 Leukemia, unspecified, in remission: Secondary | ICD-10-CM | POA: Diagnosis not present

## 2024-01-07 DIAGNOSIS — Z515 Encounter for palliative care: Secondary | ICD-10-CM | POA: Diagnosis not present

## 2024-01-07 DIAGNOSIS — E1122 Type 2 diabetes mellitus with diabetic chronic kidney disease: Secondary | ICD-10-CM | POA: Diagnosis not present

## 2024-01-07 DIAGNOSIS — S2241XA Multiple fractures of ribs, right side, initial encounter for closed fracture: Secondary | ICD-10-CM | POA: Insufficient documentation

## 2024-01-07 DIAGNOSIS — R0609 Other forms of dyspnea: Secondary | ICD-10-CM | POA: Insufficient documentation

## 2024-01-07 DIAGNOSIS — I130 Hypertensive heart and chronic kidney disease with heart failure and stage 1 through stage 4 chronic kidney disease, or unspecified chronic kidney disease: Secondary | ICD-10-CM | POA: Diagnosis not present

## 2024-01-07 DIAGNOSIS — E538 Deficiency of other specified B group vitamins: Secondary | ICD-10-CM | POA: Diagnosis not present

## 2024-01-07 DIAGNOSIS — F330 Major depressive disorder, recurrent, mild: Secondary | ICD-10-CM | POA: Diagnosis not present

## 2024-01-07 DIAGNOSIS — N183 Chronic kidney disease, stage 3 unspecified: Secondary | ICD-10-CM | POA: Diagnosis not present

## 2024-01-07 DIAGNOSIS — F419 Anxiety disorder, unspecified: Secondary | ICD-10-CM | POA: Diagnosis not present

## 2024-01-07 DIAGNOSIS — G309 Alzheimer's disease, unspecified: Secondary | ICD-10-CM | POA: Diagnosis not present

## 2024-01-07 DIAGNOSIS — K219 Gastro-esophageal reflux disease without esophagitis: Secondary | ICD-10-CM | POA: Diagnosis not present

## 2024-01-07 DIAGNOSIS — S2242XD Multiple fractures of ribs, left side, subsequent encounter for fracture with routine healing: Secondary | ICD-10-CM | POA: Diagnosis not present

## 2024-01-07 DIAGNOSIS — F0280 Dementia in other diseases classified elsewhere without behavioral disturbance: Secondary | ICD-10-CM | POA: Diagnosis not present

## 2024-01-07 DIAGNOSIS — Z794 Long term (current) use of insulin: Secondary | ICD-10-CM | POA: Diagnosis not present

## 2024-01-07 DIAGNOSIS — E11319 Type 2 diabetes mellitus with unspecified diabetic retinopathy without macular edema: Secondary | ICD-10-CM | POA: Diagnosis not present

## 2024-01-07 DIAGNOSIS — N400 Enlarged prostate without lower urinary tract symptoms: Secondary | ICD-10-CM | POA: Diagnosis not present

## 2024-01-07 DIAGNOSIS — D631 Anemia in chronic kidney disease: Secondary | ICD-10-CM | POA: Diagnosis not present

## 2024-01-07 DIAGNOSIS — I739 Peripheral vascular disease, unspecified: Secondary | ICD-10-CM | POA: Diagnosis not present

## 2024-01-07 DIAGNOSIS — I10 Essential (primary) hypertension: Secondary | ICD-10-CM | POA: Diagnosis not present

## 2024-01-07 DIAGNOSIS — N1832 Chronic kidney disease, stage 3b: Secondary | ICD-10-CM | POA: Diagnosis not present

## 2024-01-09 DIAGNOSIS — I129 Hypertensive chronic kidney disease with stage 1 through stage 4 chronic kidney disease, or unspecified chronic kidney disease: Secondary | ICD-10-CM | POA: Diagnosis not present

## 2024-01-09 DIAGNOSIS — B372 Candidiasis of skin and nail: Secondary | ICD-10-CM | POA: Diagnosis not present

## 2024-01-09 DIAGNOSIS — G309 Alzheimer's disease, unspecified: Secondary | ICD-10-CM | POA: Diagnosis not present

## 2024-01-09 DIAGNOSIS — S2242XA Multiple fractures of ribs, left side, initial encounter for closed fracture: Secondary | ICD-10-CM | POA: Diagnosis not present

## 2024-01-14 DIAGNOSIS — G309 Alzheimer's disease, unspecified: Secondary | ICD-10-CM | POA: Diagnosis not present

## 2024-01-14 DIAGNOSIS — S2249XA Multiple fractures of ribs, unspecified side, initial encounter for closed fracture: Secondary | ICD-10-CM | POA: Diagnosis not present

## 2024-01-14 DIAGNOSIS — I130 Hypertensive heart and chronic kidney disease with heart failure and stage 1 through stage 4 chronic kidney disease, or unspecified chronic kidney disease: Secondary | ICD-10-CM | POA: Diagnosis not present

## 2024-01-14 DIAGNOSIS — Z794 Long term (current) use of insulin: Secondary | ICD-10-CM | POA: Diagnosis not present

## 2024-01-14 DIAGNOSIS — B372 Candidiasis of skin and nail: Secondary | ICD-10-CM | POA: Diagnosis not present

## 2024-01-14 DIAGNOSIS — Z7901 Long term (current) use of anticoagulants: Secondary | ICD-10-CM | POA: Diagnosis not present

## 2024-01-14 DIAGNOSIS — Z79899 Other long term (current) drug therapy: Secondary | ICD-10-CM | POA: Diagnosis not present

## 2024-01-14 DIAGNOSIS — E1151 Type 2 diabetes mellitus with diabetic peripheral angiopathy without gangrene: Secondary | ICD-10-CM | POA: Diagnosis not present

## 2024-01-14 DIAGNOSIS — I129 Hypertensive chronic kidney disease with stage 1 through stage 4 chronic kidney disease, or unspecified chronic kidney disease: Secondary | ICD-10-CM | POA: Diagnosis not present

## 2024-01-14 DIAGNOSIS — Z515 Encounter for palliative care: Secondary | ICD-10-CM | POA: Diagnosis not present

## 2024-01-15 DIAGNOSIS — S2241XD Multiple fractures of ribs, right side, subsequent encounter for fracture with routine healing: Secondary | ICD-10-CM | POA: Diagnosis not present

## 2024-01-15 DIAGNOSIS — N1831 Chronic kidney disease, stage 3a: Secondary | ICD-10-CM | POA: Diagnosis not present

## 2024-01-15 DIAGNOSIS — R269 Unspecified abnormalities of gait and mobility: Secondary | ICD-10-CM | POA: Diagnosis not present

## 2024-01-15 DIAGNOSIS — F02B0 Dementia in other diseases classified elsewhere, moderate, without behavioral disturbance, psychotic disturbance, mood disturbance, and anxiety: Secondary | ICD-10-CM | POA: Diagnosis not present

## 2024-01-15 DIAGNOSIS — E1122 Type 2 diabetes mellitus with diabetic chronic kidney disease: Secondary | ICD-10-CM | POA: Diagnosis not present

## 2024-01-15 DIAGNOSIS — I130 Hypertensive heart and chronic kidney disease with heart failure and stage 1 through stage 4 chronic kidney disease, or unspecified chronic kidney disease: Secondary | ICD-10-CM | POA: Diagnosis not present

## 2024-01-15 DIAGNOSIS — E1151 Type 2 diabetes mellitus with diabetic peripheral angiopathy without gangrene: Secondary | ICD-10-CM | POA: Diagnosis not present

## 2024-01-15 DIAGNOSIS — G301 Alzheimer's disease with late onset: Secondary | ICD-10-CM | POA: Diagnosis not present

## 2024-01-15 DIAGNOSIS — L0291 Cutaneous abscess, unspecified: Secondary | ICD-10-CM | POA: Diagnosis not present

## 2024-01-15 DIAGNOSIS — F419 Anxiety disorder, unspecified: Secondary | ICD-10-CM | POA: Diagnosis not present

## 2024-01-16 DIAGNOSIS — F02B3 Dementia in other diseases classified elsewhere, moderate, with mood disturbance: Secondary | ICD-10-CM | POA: Diagnosis not present

## 2024-01-16 DIAGNOSIS — G301 Alzheimer's disease with late onset: Secondary | ICD-10-CM | POA: Diagnosis not present

## 2024-01-16 DIAGNOSIS — G4700 Insomnia, unspecified: Secondary | ICD-10-CM | POA: Diagnosis not present

## 2024-01-18 ENCOUNTER — Emergency Department

## 2024-01-18 ENCOUNTER — Inpatient Hospital Stay
Admission: EM | Admit: 2024-01-18 | Discharge: 2024-01-22 | DRG: 535 | Disposition: A | Discharge status: Other Acute Inpatient Hospital | Source: Skilled Nursing Facility | Attending: Internal Medicine | Admitting: Internal Medicine

## 2024-01-18 DIAGNOSIS — R0609 Other forms of dyspnea: Secondary | ICD-10-CM | POA: Diagnosis not present

## 2024-01-18 DIAGNOSIS — N1832 Chronic kidney disease, stage 3b: Secondary | ICD-10-CM | POA: Diagnosis not present

## 2024-01-18 DIAGNOSIS — S72002A Fracture of unspecified part of neck of left femur, initial encounter for closed fracture: Principal | ICD-10-CM | POA: Diagnosis present

## 2024-01-18 DIAGNOSIS — C9000 Multiple myeloma not having achieved remission: Secondary | ICD-10-CM | POA: Diagnosis present

## 2024-01-18 DIAGNOSIS — I493 Ventricular premature depolarization: Secondary | ICD-10-CM | POA: Diagnosis present

## 2024-01-18 DIAGNOSIS — F32A Depression, unspecified: Secondary | ICD-10-CM | POA: Diagnosis present

## 2024-01-18 DIAGNOSIS — F0283 Dementia in other diseases classified elsewhere, unspecified severity, with mood disturbance: Secondary | ICD-10-CM | POA: Diagnosis not present

## 2024-01-18 DIAGNOSIS — Z87891 Personal history of nicotine dependence: Secondary | ICD-10-CM

## 2024-01-18 DIAGNOSIS — J948 Other specified pleural conditions: Secondary | ICD-10-CM | POA: Diagnosis not present

## 2024-01-18 DIAGNOSIS — Z743 Need for continuous supervision: Secondary | ICD-10-CM | POA: Diagnosis not present

## 2024-01-18 DIAGNOSIS — J9601 Acute respiratory failure with hypoxia: Secondary | ICD-10-CM | POA: Diagnosis not present

## 2024-01-18 DIAGNOSIS — F0284 Dementia in other diseases classified elsewhere, unspecified severity, with anxiety: Secondary | ICD-10-CM | POA: Diagnosis present

## 2024-01-18 DIAGNOSIS — I5032 Chronic diastolic (congestive) heart failure: Secondary | ICD-10-CM | POA: Diagnosis not present

## 2024-01-18 DIAGNOSIS — E1122 Type 2 diabetes mellitus with diabetic chronic kidney disease: Secondary | ICD-10-CM | POA: Diagnosis not present

## 2024-01-18 DIAGNOSIS — M160 Bilateral primary osteoarthritis of hip: Secondary | ICD-10-CM | POA: Diagnosis not present

## 2024-01-18 DIAGNOSIS — R296 Repeated falls: Secondary | ICD-10-CM | POA: Diagnosis present

## 2024-01-18 DIAGNOSIS — Z961 Presence of intraocular lens: Secondary | ICD-10-CM | POA: Diagnosis present

## 2024-01-18 DIAGNOSIS — S2242XA Multiple fractures of ribs, left side, initial encounter for closed fracture: Secondary | ICD-10-CM | POA: Diagnosis not present

## 2024-01-18 DIAGNOSIS — J9811 Atelectasis: Secondary | ICD-10-CM | POA: Diagnosis not present

## 2024-01-18 DIAGNOSIS — Z9842 Cataract extraction status, left eye: Secondary | ICD-10-CM

## 2024-01-18 DIAGNOSIS — Y92009 Unspecified place in unspecified non-institutional (private) residence as the place of occurrence of the external cause: Secondary | ICD-10-CM

## 2024-01-18 DIAGNOSIS — S72002S Fracture of unspecified part of neck of left femur, sequela: Secondary | ICD-10-CM | POA: Diagnosis not present

## 2024-01-18 DIAGNOSIS — Z833 Family history of diabetes mellitus: Secondary | ICD-10-CM | POA: Diagnosis not present

## 2024-01-18 DIAGNOSIS — W19XXXA Unspecified fall, initial encounter: Secondary | ICD-10-CM

## 2024-01-18 DIAGNOSIS — Z794 Long term (current) use of insulin: Secondary | ICD-10-CM

## 2024-01-18 DIAGNOSIS — I472 Ventricular tachycardia, unspecified: Secondary | ICD-10-CM | POA: Diagnosis present

## 2024-01-18 DIAGNOSIS — Z8249 Family history of ischemic heart disease and other diseases of the circulatory system: Secondary | ICD-10-CM | POA: Diagnosis not present

## 2024-01-18 DIAGNOSIS — S79912A Unspecified injury of left hip, initial encounter: Secondary | ICD-10-CM | POA: Diagnosis not present

## 2024-01-18 DIAGNOSIS — D61818 Other pancytopenia: Secondary | ICD-10-CM | POA: Diagnosis not present

## 2024-01-18 DIAGNOSIS — Z01818 Encounter for other preprocedural examination: Secondary | ICD-10-CM | POA: Diagnosis not present

## 2024-01-18 DIAGNOSIS — G309 Alzheimer's disease, unspecified: Secondary | ICD-10-CM | POA: Diagnosis present

## 2024-01-18 DIAGNOSIS — I441 Atrioventricular block, second degree: Secondary | ICD-10-CM | POA: Diagnosis not present

## 2024-01-18 DIAGNOSIS — F418 Other specified anxiety disorders: Secondary | ICD-10-CM | POA: Diagnosis present

## 2024-01-18 DIAGNOSIS — Z85828 Personal history of other malignant neoplasm of skin: Secondary | ICD-10-CM

## 2024-01-18 DIAGNOSIS — S0990XA Unspecified injury of head, initial encounter: Secondary | ICD-10-CM | POA: Diagnosis not present

## 2024-01-18 DIAGNOSIS — Z7982 Long term (current) use of aspirin: Secondary | ICD-10-CM | POA: Diagnosis not present

## 2024-01-18 DIAGNOSIS — N400 Enlarged prostate without lower urinary tract symptoms: Secondary | ICD-10-CM | POA: Diagnosis not present

## 2024-01-18 DIAGNOSIS — E1129 Type 2 diabetes mellitus with other diabetic kidney complication: Secondary | ICD-10-CM | POA: Diagnosis present

## 2024-01-18 DIAGNOSIS — S2249XA Multiple fractures of ribs, unspecified side, initial encounter for closed fracture: Secondary | ICD-10-CM | POA: Diagnosis not present

## 2024-01-18 DIAGNOSIS — I130 Hypertensive heart and chronic kidney disease with heart failure and stage 1 through stage 4 chronic kidney disease, or unspecified chronic kidney disease: Secondary | ICD-10-CM | POA: Diagnosis not present

## 2024-01-18 DIAGNOSIS — S2232XA Fracture of one rib, left side, initial encounter for closed fracture: Secondary | ICD-10-CM | POA: Diagnosis present

## 2024-01-18 DIAGNOSIS — M8580 Other specified disorders of bone density and structure, unspecified site: Secondary | ICD-10-CM | POA: Diagnosis not present

## 2024-01-18 DIAGNOSIS — D472 Monoclonal gammopathy: Secondary | ICD-10-CM | POA: Diagnosis not present

## 2024-01-18 DIAGNOSIS — D46Z Other myelodysplastic syndromes: Secondary | ICD-10-CM | POA: Diagnosis present

## 2024-01-18 DIAGNOSIS — R918 Other nonspecific abnormal finding of lung field: Secondary | ICD-10-CM | POA: Diagnosis not present

## 2024-01-18 DIAGNOSIS — E785 Hyperlipidemia, unspecified: Secondary | ICD-10-CM | POA: Diagnosis present

## 2024-01-18 DIAGNOSIS — C9141 Hairy cell leukemia, in remission: Secondary | ICD-10-CM | POA: Diagnosis not present

## 2024-01-18 DIAGNOSIS — F0280 Dementia in other diseases classified elsewhere without behavioral disturbance: Secondary | ICD-10-CM | POA: Diagnosis present

## 2024-01-18 DIAGNOSIS — I10 Essential (primary) hypertension: Secondary | ICD-10-CM | POA: Diagnosis not present

## 2024-01-18 DIAGNOSIS — R410 Disorientation, unspecified: Secondary | ICD-10-CM | POA: Diagnosis not present

## 2024-01-18 DIAGNOSIS — Z66 Do not resuscitate: Secondary | ICD-10-CM | POA: Diagnosis present

## 2024-01-18 DIAGNOSIS — J90 Pleural effusion, not elsewhere classified: Secondary | ICD-10-CM | POA: Diagnosis not present

## 2024-01-18 DIAGNOSIS — Z79899 Other long term (current) drug therapy: Secondary | ICD-10-CM | POA: Diagnosis not present

## 2024-01-18 DIAGNOSIS — R9431 Abnormal electrocardiogram [ECG] [EKG]: Secondary | ICD-10-CM | POA: Diagnosis not present

## 2024-01-18 DIAGNOSIS — Z9841 Cataract extraction status, right eye: Secondary | ICD-10-CM

## 2024-01-18 DIAGNOSIS — Z7902 Long term (current) use of antithrombotics/antiplatelets: Secondary | ICD-10-CM

## 2024-01-18 DIAGNOSIS — R0902 Hypoxemia: Secondary | ICD-10-CM | POA: Diagnosis not present

## 2024-01-18 DIAGNOSIS — S2242XS Multiple fractures of ribs, left side, sequela: Secondary | ICD-10-CM | POA: Diagnosis not present

## 2024-01-18 DIAGNOSIS — Z888 Allergy status to other drugs, medicaments and biological substances status: Secondary | ICD-10-CM

## 2024-01-18 DIAGNOSIS — Z9889 Other specified postprocedural states: Secondary | ICD-10-CM

## 2024-01-18 DIAGNOSIS — S72042A Displaced fracture of base of neck of left femur, initial encounter for closed fracture: Secondary | ICD-10-CM | POA: Diagnosis not present

## 2024-01-18 DIAGNOSIS — M25552 Pain in left hip: Secondary | ICD-10-CM | POA: Diagnosis not present

## 2024-01-18 DIAGNOSIS — I252 Old myocardial infarction: Secondary | ICD-10-CM

## 2024-01-18 DIAGNOSIS — I6782 Cerebral ischemia: Secondary | ICD-10-CM | POA: Diagnosis not present

## 2024-01-18 DIAGNOSIS — Z01810 Encounter for preprocedural cardiovascular examination: Secondary | ICD-10-CM | POA: Diagnosis not present

## 2024-01-18 DIAGNOSIS — Z860100 Personal history of colon polyps, unspecified: Secondary | ICD-10-CM

## 2024-01-18 DIAGNOSIS — Z882 Allergy status to sulfonamides status: Secondary | ICD-10-CM

## 2024-01-18 DIAGNOSIS — Z8619 Personal history of other infectious and parasitic diseases: Secondary | ICD-10-CM

## 2024-01-18 LAB — BASIC METABOLIC PANEL WITH GFR
Anion gap: 6 (ref 5–15)
BUN: 20 mg/dL (ref 8–23)
CO2: 20 mmol/L — ABNORMAL LOW (ref 22–32)
Calcium: 8.3 mg/dL — ABNORMAL LOW (ref 8.9–10.3)
Chloride: 109 mmol/L (ref 98–111)
Creatinine, Ser: 1.26 mg/dL — ABNORMAL HIGH (ref 0.61–1.24)
GFR, Estimated: 55 mL/min — ABNORMAL LOW (ref >60)
Glucose, Bld: 171 mg/dL — ABNORMAL HIGH (ref 70–99)
Potassium: 4.3 mmol/L (ref 3.5–5.1)
Sodium: 135 mmol/L (ref 135–145)

## 2024-01-18 LAB — CBC WITH DIFFERENTIAL/PLATELET
Abs Immature Granulocytes: 0.01 10*3/uL (ref 0.00–0.07)
Basophils Absolute: 0 10*3/uL (ref 0.0–0.1)
Basophils Relative: 0 %
Eosinophils Absolute: 0.1 10*3/uL (ref 0.0–0.5)
Eosinophils Relative: 2 %
HCT: 29.9 % — ABNORMAL LOW (ref 39.0–52.0)
Hemoglobin: 9.5 g/dL — ABNORMAL LOW (ref 13.0–17.0)
Immature Granulocytes: 0 %
Lymphocytes Relative: 34 %
Lymphs Abs: 1.3 10*3/uL (ref 0.7–4.0)
MCH: 35.4 pg — ABNORMAL HIGH (ref 26.0–34.0)
MCHC: 31.8 g/dL (ref 30.0–36.0)
MCV: 111.6 fL — ABNORMAL HIGH (ref 80.0–100.0)
Monocytes Absolute: 0.2 10*3/uL (ref 0.1–1.0)
Monocytes Relative: 5 %
Neutro Abs: 2.3 10*3/uL (ref 1.7–7.7)
Neutrophils Relative %: 59 %
Platelets: 82 10*3/uL — ABNORMAL LOW (ref 150–400)
RBC: 2.68 MIL/uL — ABNORMAL LOW (ref 4.22–5.81)
RDW: 17.4 % — ABNORMAL HIGH (ref 11.5–15.5)
Smear Review: NORMAL
WBC: 3.9 10*3/uL — ABNORMAL LOW (ref 4.0–10.5)
nRBC: 0.8 % — ABNORMAL HIGH (ref 0.0–0.2)

## 2024-01-18 LAB — PROTIME-INR
INR: 1 (ref 0.8–1.2)
Prothrombin Time: 13 s (ref 11.4–15.2)

## 2024-01-18 LAB — BRAIN NATRIURETIC PEPTIDE: B Natriuretic Peptide: 66.5 pg/mL (ref 0.0–100.0)

## 2024-01-18 MED ORDER — MORPHINE SULFATE (PF) 2 MG/ML IV SOLN
1.0000 mg | INTRAVENOUS | Status: DC | PRN
  Filled 2024-01-18: qty 1

## 2024-01-18 MED ORDER — TRANEXAMIC ACID-NACL 1000-0.7 MG/100ML-% IV SOLN
1000.0000 mg | INTRAVENOUS | Status: DC
  Filled 2024-01-18: qty 100

## 2024-01-18 MED ORDER — ONDANSETRON HCL 4 MG/2ML IJ SOLN: 4.0000 mg | Freq: Three times a day (TID) | INTRAMUSCULAR | Status: DC | PRN

## 2024-01-18 MED ORDER — METHOCARBAMOL 500 MG PO TABS
500.0000 mg | ORAL_TABLET | Freq: Three times a day (TID) | ORAL | Status: DC | PRN
  Filled 2024-01-18: qty 1

## 2024-01-18 MED ORDER — INSULIN ASPART 100 UNIT/ML IJ SOLN
0.0000 [IU] | Freq: Three times a day (TID) | INTRAMUSCULAR | Status: DC
  Administered 2024-01-19: 2 [IU] via SUBCUTANEOUS
  Administered 2024-01-19: 1 [IU] via SUBCUTANEOUS
  Administered 2024-01-21: 2 [IU] via SUBCUTANEOUS
  Administered 2024-01-21: 1 [IU] via SUBCUTANEOUS
  Administered 2024-01-21: 3 [IU] via SUBCUTANEOUS
  Filled 2024-01-18 (×5): qty 1

## 2024-01-18 MED ORDER — LIDOCAINE 5 % EX PTCH
1.0000 | MEDICATED_PATCH | Freq: Every day | CUTANEOUS | Status: DC
  Administered 2024-01-19 – 2024-01-21 (×4): 1 via TRANSDERMAL
  Filled 2024-01-18 (×4): qty 1

## 2024-01-18 MED ORDER — TRANEXAMIC ACID-NACL 1000-0.7 MG/100ML-% IV SOLN: 1000.0000 mg | INTRAVENOUS | Status: DC

## 2024-01-18 MED ORDER — SENNOSIDES-DOCUSATE SODIUM 8.6-50 MG PO TABS
1.0000 | ORAL_TABLET | Freq: Every evening | ORAL | Status: DC | PRN
Start: 2024-01-18 — End: 2024-01-22
  Administered 2024-01-21: 1 via ORAL
  Filled 2024-01-18: qty 1

## 2024-01-18 MED ORDER — ALBUTEROL SULFATE HFA 108 (90 BASE) MCG/ACT IN AERS: 2.0000 | INHALATION_SPRAY | RESPIRATORY_TRACT | Status: DC | PRN

## 2024-01-18 MED ORDER — INSULIN ASPART 100 UNIT/ML IJ SOLN: 0.0000 [IU] | Freq: Every day | INTRAMUSCULAR | Status: DC

## 2024-01-18 MED ORDER — MORPHINE SULFATE (PF) 2 MG/ML IV SOLN
2.0000 mg | Freq: Once | INTRAVENOUS | Status: AC
  Administered 2024-01-18: 2 mg via INTRAVENOUS
  Filled 2024-01-18: qty 1

## 2024-01-18 MED ORDER — ACETAMINOPHEN 325 MG PO TABS
650.0000 mg | ORAL_TABLET | Freq: Four times a day (QID) | ORAL | Status: DC | PRN
  Administered 2024-01-21: 650 mg via ORAL
  Filled 2024-01-18 (×3): qty 2

## 2024-01-18 MED ORDER — IPRATROPIUM-ALBUTEROL 0.5-2.5 (3) MG/3ML IN SOLN
3.0000 mL | Freq: Four times a day (QID) | RESPIRATORY_TRACT | Status: DC
  Administered 2024-01-19 (×2): 3 mL via RESPIRATORY_TRACT
  Filled 2024-01-18 (×2): qty 3

## 2024-01-18 MED ORDER — TRANEXAMIC ACID-NACL 1000-0.7 MG/100ML-% IV SOLN
1000.0000 mg | Freq: Once | INTRAVENOUS | Status: DC
  Filled 2024-01-18: qty 100

## 2024-01-18 MED ORDER — OXYCODONE-ACETAMINOPHEN 5-325 MG PO TABS
1.0000 | ORAL_TABLET | ORAL | Status: DC | PRN
  Administered 2024-01-19 – 2024-01-21 (×5): 1 via ORAL
  Filled 2024-01-18 (×5): qty 1

## 2024-01-18 MED ORDER — HYDRALAZINE HCL 20 MG/ML IJ SOLN: 5.0000 mg | INTRAMUSCULAR | Status: DC | PRN

## 2024-01-18 MED ORDER — ALBUTEROL SULFATE (2.5 MG/3ML) 0.083% IN NEBU: 2.5000 mg | INHALATION_SOLUTION | RESPIRATORY_TRACT | Status: DC | PRN

## 2024-01-18 MED ORDER — CEFAZOLIN SODIUM-DEXTROSE 2-4 GM/100ML-% IV SOLN
2.0000 g | Freq: Once | INTRAVENOUS | Status: AC
  Administered 2024-01-19: 2 g via INTRAVENOUS
  Filled 2024-01-18: qty 100

## 2024-01-18 MED ORDER — DM-GUAIFENESIN ER 30-600 MG PO TB12: 1.0000 | ORAL_TABLET | Freq: Two times a day (BID) | ORAL | Status: DC | PRN

## 2024-01-18 NOTE — Progress Notes (Signed)
 Full consult note and discussion with patient to follow tomorrow AM.  Called by ED staff. Imaging reviewed.  - Plan for surgery tomorrow, likely ~11am pending OR availability - NPO after midnight - Hold anticoagulation - Admit to Hospitalist team.

## 2024-01-18 NOTE — H&P (Incomplete)
 History and Physical    AUTHOR HATLESTAD ZOX:[id number] DOB: 04/16/1935 DOA: 01/18/2024  Referring MD/NP/PA:   PCP: [facility]   Patient coming from:  The patient is coming from SNF   Chief Complaint: fall and left hip pain.  HPI: Alex Smith is a 89 y.o. male with medical history significant of dCHF, HTN, HLD, dCHF, GERD, dementia, BPH, CKD-3b, depression pancytopenia, smoldering myeloma,  hairy cell leukemia in remission, myelodysplastic syndrome, low grade, multiple myeloma, myelodysplastic syndrome, low grader, on immunotherapy, recent admission due to fall and left 3-9th rib fracture in April (4/13-4/22), who presents with fall and left hip pain.  Patient has history of dementia, but he is alert, and answered most questions appropriately during the interview. Pt states that he accidentally fell when he was turning around in facility this afternoon.  Denies LOC.  Per his son, pharmacy technician in facility reported that the patient possibly hit his head.  He developed pain in the left hip, which is constant, sharp, severe, nonradiating, aggravated by movement.  No leg numbness or weakness. Patient is on aspirin  but no other blood thinners  Patient denies SOB, but he was found to have oxygen desaturation to 86% on room air per nurse report.  Oxygen improved to 95% on 4L oxygen.  Patient denies chest pain, cough.  No fever or chills.  Denies nausea, vomiting, diarrhea or abdominal pain.  No symptoms of UTI.  Data reviewed independently and ED Course: pt was found to have pancytopenia with WBC 3.9, hemoglobin 9.5, platelet 82 (patient had WBC 3.8, hemoglobin 8, platelets 115 on 12/29/2023), renal function stable, temperature normal, blood pressure 170/82, heart rate 100, RR 24.  Patient is admitted to telemetry bed as inpatient.  Dr. Lydia Sams of Ortho is consulted. Epic message sent to CV DIV [facility] consult pool for [facility] cardiology consult.   CT-left hip: 1. Comminuted fractures of  the left femoral neck extending to the lesser trochanter. Mild varus angulation.   X-ray of left hip/pelvis: Findings suspicious for nondisplaced left femoral intertrochanteric fracture  CXR: Large left pleural effusion with likely left lung atelectasis/consolidation causing diffuse opacification of the left hemithorax and progressing since prior study. Acute appearing left rib fractures are unchanged since prior study.    EKG: I have personally reviewed.  Sinus rhythm, low voltage, poor R wave progression, anteroseptal infarction pattern, seems to have electric alternans   Review of Systems:   General: no fevers, chills, no body weight gain, has fatigue HEENT: no blurry vision, hearing changes or sore throat Respiratory: no dyspnea, coughing, wheezing CV: no chest pain, no palpitations GI: no nausea, vomiting, abdominal pain, diarrhea, constipation GU: no dysuria, burning on urination, increased urinary frequency, hematuria  Ext: has leg edema Neuro: no unilateral weakness, numbness, or tingling, no vision change or hearing loss. Has fall Skin: no rash, no skin tear. MSK: has left hip pain. Heme: No easy bruising.  Travel history: No recent long distant travel.   Allergy:  Allergies  Allergen Reactions  . Rituxan [Rituximab] Rash and Other (See Comments)    Chest tightness   . Blood-Group Specific Substance Other (See Comments)    Had a post transfusion reaction of red blood cells; NOW REQUIRES WASHED BLOOD CELLS  . Primaxin Iv [Imipenem-Cilastatin] Other (See Comments)    Unknown reaction  . Primaxin [Imipenem] Other (See Comments)    Unknown reaction Tolerates cephalosporins  . Vfend [Voriconazole] Other (See Comments)    Unknown reaction  . Sulfa Antibiotics  Itching and Rash  . Sulfacetamide Sodium Itching and Rash    Past Medical History:  Diagnosis Date  . Actinic keratosis   . Alzheimer's dementia (HCC) 08/18/2021  . Anemia   . B12 deficiency   . Basal  cell carcinoma 03/30/2018   left ant nasal ala  . Basal cell carcinoma 06/25/2023   R cheek, schedule for ED&C  . Basal cell carcinoma of face 01/19/2013   right distal dorsum nose  . BPH (benign prostatic hypertrophy)    followed by urology, discharged (Dr. Cherylene Corrente)  . CAP (community acquired pneumonia) 02/15/2015  . CKD stage 3 due to type 2 diabetes mellitus (HCC) 11/14/2017  . Colon polyps   . Diverticulosis   . Dysplastic nevus 10/27/2006   right med calf  . GERD (gastroesophageal reflux disease)   . Hairy cell leukemia (HCC) 2006   recurrent, seizure on rituxan, now on cladribine  ([place])  . History of pneumonia 2000's   "once" (07/07/2012)  . History of shingles   . HLD (hyperlipidemia)   . Hypertension   . Pneumonia   . Shortness of breath dyspnea   . Squamous cell carcinoma in situ 07/05/2020   Left lat. pretibial. EDC 09/07/2020  . Squamous cell carcinoma in situ 07/05/2020   Right medial pretibial. EDC 09/07/2020  . Squamous cell carcinoma of skin 04/03/2020   Left cheek. WD SCC  . Systolic murmur 05/28/2016  . Type 2 diabetes, controlled, with retinopathy (HCC)     Past Surgical History:  Procedure Laterality Date  . 25 GAUGE PARS PLANA VITRECTOMY WITH 20 GAUGE MVR PORT FOR MACULAR HOLE  07/07/2012   Procedure: 25 GAUGE PARS PLANA VITRECTOMY WITH 20 GAUGE MVR PORT FOR MACULAR HOLE;  Surgeon: Rexene Catching, MD;  Location: [facility] OR;  Service: Ophthalmology;  Laterality: Left;  . BONE MARROW BIOPSY  2016  . CARDIOVASCULAR STRESS TEST  2013   treadmill - no evidence ischemia, EF 61%  . CATARACT EXTRACTION W/ INTRAOCULAR LENS  IMPLANT, BILATERAL  ~ 2010  . COLONOSCOPY  2014   [place] WNL no rpt needed, h/o polyps  . EYE SURGERY Left 06/2012   laser surgery  . GAS INSERTION  07/07/2012   Procedure: INSERTION OF GAS;  Surgeon: Rexene Catching, MD;  Location: [facility] OR;  Service: Ophthalmology;  Laterality: Left;  C3F8  . PERIPHERAL VASCULAR CATHETERIZATION N/A 02/23/2015    Procedure: [place] Stade Cath Insertion;  Surgeon: Celso College, MD;  Location: [facility] INVASIVE CV LAB;  Service: Cardiovascular;  Laterality: N/A;  . PORTA CATH REMOVAL N/A 02/16/2020   Procedure: PORTA CATH REMOVAL;  Surgeon: Celso College, MD;  Location: [facility] INVASIVE CV LAB;  Service: Cardiovascular;  Laterality: N/A;  . SERUM PATCH  07/07/2012   Procedure: SERUM PATCH;  Surgeon: Rexene Catching, MD;  Location: [facility] OR;  Service: Ophthalmology;  Laterality: Left;  . SKIN CANCER EXCISION     "all over my face" (07/07/2012)    Social History:  reports that he quit smoking about 54 years ago. His smoking use included cigarettes. He started smoking about 80 years ago. He has a 50 pack-year smoking history. He has never used smokeless tobacco. He reports that he does not currently use alcohol. He reports that he does not use drugs.  Family History:  Family History  Problem Relation Age of Onset  . Dementia Mother   . Heart failure Father 38  . Cancer Sister        breast  . Diabetes  Paternal Uncle   . Diabetes Paternal Aunt   . CAD Brother 46       MI  . Stroke Neg Hx      Prior to Admission medications   Medication Sig Start Date End Date Taking? Authorizing Provider  acetaminophen  (TYLENOL ) 500 MG tablet Take 2 tablets (1,000 mg total) by mouth 2 (two) times daily. 11/27/22   Verla Glaze, MD  albuterol  (VENTOLIN  HFA) 108 (90 Base) MCG/ACT inhaler Inhale 2 puffs into the lungs every 6 (six) hours as needed for wheezing or shortness of breath. 04/20/22   Garnet Just, MD  alum & mag hydroxide-simeth (MAALOX/MYLANTA) 200-200-20 MG/5ML suspension Take 30 mLs by mouth every 4 (four) hours as needed for indigestion or heartburn. Patient taking differently: Take 30 mLs by mouth 4 (four) times daily as needed for indigestion or heartburn. 04/20/22   Garnet Just, MD  amLODipine  (NORVASC ) 10 MG tablet Take 1 tablet (10 mg total) by mouth daily. 04/20/22   Garnet Just, MD  amoxicillin-clavulanate  (AUGMENTIN) 875-125 MG tablet Take 1 tablet by mouth 2 (two) times daily.    [provider]  aspirin  EC 81 MG tablet Take 81 mg by mouth daily.    [provider]  donepezil  (ARICEPT ) 5 MG tablet Take 5 mg by mouth daily.    [provider]  doxycycline (ADOXA) 100 MG tablet Take 100 mg by mouth 2 (two) times daily.    [provider]  famotidine  (PEPCID ) 20 MG tablet Take 20 mg by mouth daily.    [provider]  guaiFENesin  (GERI-TUSSIN) 100 MG/5ML liquid Take 15 mLs by mouth every 6 (six) hours as needed for cough.    [provider]  insulin  glargine (LANTUS ) 100 UNIT/ML Solostar Pen Inject 14 Units into the skin at bedtime. Patient taking differently: Inject 8 Units into the skin at bedtime. 09/18/23   Lorita Rosa, MD  insulin  lispro (HUMALOG ) 100 UNIT/ML KwikPen Inject 2 Units into the skin 3 (three) times daily before meals. Per sliding scale Patient taking differently: Inject 7 Units into the skin 3 (three) times daily before meals. 11/27/22   Verla Glaze, MD  loperamide  (IMODIUM  A-D) 2 MG tablet Take 4 mg by mouth See admin instructions. Give 2 tablets (4mg ) by mouth after each loose stool as needed. Limit 8 tablets in 24 hours.    [provider]  losartan  (COZAAR ) 25 MG tablet Take 1 tablet (25 mg total) by mouth daily. 11/27/22 01/24/24  Verla Glaze, MD  Luspatercept -aamt (REBLOZYL  [place]) Inject 100 mg into the skin See admin instructions. Infuse 100mg  into vein, every 3 weeks at oncology office.    [provider]  magnesium  hydroxide (MILK OF MAGNESIA) 400 MG/5ML suspension Take 30 mLs by mouth daily as needed (constipation).    [provider]  Melatonin 10 MG TABS Take 10 mg by mouth at bedtime.    [provider]  Multiple Vitamin (MULTIVITAMIN WITH MINERALS) TABS tablet Take 1 tablet by mouth daily. 08/23/21   Lesa Rape, MD  oxyCODONE  (OXY IR/ROXICODONE ) 5 MG immediate release tablet Take  0.5-1 tablets (2.5-5 mg total) by mouth every 4 (four) hours as needed for moderate pain (pain score 4-6) or severe pain (pain score 7-10) (2.5 mg for moderate, 5 mg for severe). 12/09/23   Maczis, Michael M, PA-C  pantoprazole  (PROTONIX ) 40 MG tablet Take 1 tablet (40 mg total) by mouth 2 (two) times daily. 04/20/22   Garnet Just, MD  pravastatin  (PRAVACHOL ) 20  MG tablet Take 20 mg by mouth at bedtime.    [provider]  sertraline  (ZOLOFT ) 25 MG tablet Take 25 mg by mouth daily.    [provider]  tamsulosin  (FLOMAX ) 0.4 MG CAPS capsule TAKE 1 CAPSULE BY MOUTH EVERY DAY Patient taking differently: Take 0.4 mg by mouth at bedtime. 12/11/17   Claire Crick, MD  torsemide  (DEMADEX ) 20 MG tablet Take 20 mg by mouth daily.    [provider]  triamcinolone cream (KENALOG) 0.1 % Apply 1 Application topically 2 (two) times daily. Apply liberally to bilateral lower legs.    [provider]  UNABLE TO FIND Apply 1 application  topically daily. Remove dressing and cleanse the foot, toes and wound with Vashe or saline or distilled water. Put Betadine on gauze and place on wound. Cover it with dry gauze then ABD. Place a dry 4x4 gauze on anterior ankle to protect it. Wrap with Kerlix then loose ACE. Secure with tape. Do this daily for the left 2nd toe.    [provider]  vitamin B-12 (CYANOCOBALAMIN ) 1000 MCG tablet Take 1 tablet (1,000 mcg total) by mouth daily. 06/01/19   Claire Crick, MD  vitamin E  400 UNIT capsule Take 400 Units by mouth daily.    [provider]    Physical Exam: Vitals:   01/18/24 2130 01/18/24 2200 01/18/24 2315 01/18/24 2345  BP: (!) 179/102 (!) 170/82  (!) 150/81  Pulse: 71 100 96 98  Resp: (!) 23 (!) 22 (!) 31 17  Temp:    98.2 F (36.8 C)  TempSrc:    Oral  SpO2: 95% 93% 93% 94%   General: Not in acute distress HEENT:       Eyes: PERRL, EOMI, no jaundice       ENT: No discharge from the ears and nose, no  pharynx injection, no tonsillar enlargement.        Neck: No JVD, no bruit, no mass felt. Heme: No neck lymph node enlargement. Cardiac: S1/S2, RRR, 2/5 systolic murmurs, No gallops or rubs. Respiratory: has decreased air movement on the left side GI: Soft, nondistended, nontender, no rebound pain, no organomegaly, BS present. GU: No hematuria Ext: 1+ pitting leg edema bilaterally. 1+DP/PT pulse bilaterally. Musculoskeletal: has left hip tenderness Skin: No rashes.  Neuro: Alert, oriented X3, cranial nerves II-XII grossly intact, moves all extremities normally. Psych: Patient is not psychotic, no suicidal or hemocidal ideation.  Labs on Admission: I have personally reviewed following labs and imaging studies  CBC: Recent Labs  Lab 01/18/24 2053  WBC 3.9*  NEUTROABS 2.3  HGB 9.5*  HCT 29.9*  MCV 111.6*  PLT 82*   Basic Metabolic Panel: Recent Labs  Lab 01/18/24 2053  NA 135  K 4.3  CL 109  CO2 20*  GLUCOSE 171*  BUN 20  CREATININE 1.26*  CALCIUM  8.3*   GFR: CrCl cannot be calculated (Unknown ideal weight.). Liver Function Tests: No results for input(s): "AST", "ALT", "ALKPHOS", "BILITOT", "PROT", "ALBUMIN" in the last 168 hours. No results for input(s): "LIPASE", "AMYLASE" in the last 168 hours. No results for input(s): "AMMONIA" in the last 168 hours. Coagulation Profile: Recent Labs  Lab 01/18/24 2053  INR 1.0   Cardiac Enzymes: No results for input(s): "CKTOTAL", "CKMB", "CKMBINDEX", "TROPONINI" in the last 168 hours. BNP (last 3 results) No results for input(s): "PROBNP" in the last 8760 hours. HbA1C: No results for input(s): "HGBA1C" in the last 72 hours. CBG: No results for input(s): "GLUCAP"  in the last 168 hours. Lipid Profile: No results for input(s): "CHOL", "HDL", "LDLCALC", "TRIG", "CHOLHDL", "LDLDIRECT" in the last 72 hours. Thyroid  Function Tests: No results for input(s): "TSH", "T4TOTAL", "FREET4", "T3FREE", "THYROIDAB" in the last 72  hours. Anemia Panel: No results for input(s): "VITAMINB12", "FOLATE", "FERRITIN", "TIBC", "IRON", "RETICCTPCT" in the last 72 hours. Urine analysis:    Component Value Date/Time   COLORURINE YELLOW 12/04/2023 1000   APPEARANCEUR HAZY (A) 12/04/2023 1000   LABSPEC 1.015 12/04/2023 1000   PHURINE 5.0 12/04/2023 1000   GLUCOSEU NEGATIVE 12/04/2023 1000   HGBUR NEGATIVE 12/04/2023 1000   BILIRUBINUR NEGATIVE 12/04/2023 1000   BILIRUBINUR Negative 08/02/2016 1212   KETONESUR NEGATIVE 12/04/2023 1000   PROTEINUR 30 (A) 12/04/2023 1000   UROBILINOGEN 0.2 08/02/2016 1212   NITRITE NEGATIVE 12/04/2023 1000   LEUKOCYTESUR NEGATIVE 12/04/2023 1000   Sepsis Labs: @LABRCNTIP (procalcitonin:4,lacticidven:4) )No results found for this or any previous visit (from the past 240 hours).   Radiological Exams on Admission:   Assessment/Plan Principal Problem:   Closed left hip fracture [facility]) Active Problems:   Fall at home, initial encounter   Chronic diastolic CHF (congestive heart failure) (HCC)   Oxygen desaturation   Pleural effusion on left   Left rib fracture   BPH (benign prostatic hyperplasia)   Essential hypertension   Dyslipidemia   Alzheimer's dementia (HCC)   Chronic kidney disease, stage 3b (HCC)   Hairy cell leukemia, in remission (HCC)   Smoldering myeloma   Myelodysplasia present in bone marrow (HCC)   Myelodysplastic syndrome, low grade (HCC)   Multiple myeloma not having achieved remission (HCC)   Pancytopenia (HCC)   Assessment and Plan:   18M, hx of dCHF, HTN, HLD, dCHF, GERD, dementia, BPH, CKD-3b, depression pancytopenia, smoldering myeloma, hairy cell leukemia in remission, myelodysplastic syndrome, low grade, multiple myeloma, myelodysplastic syndrome, low grader, on immunotherapy, recent admission due to fall and left 3-9th rib fracture in April (4/13-4/22), presents with fall and left hip fracture.  Denies SOB, but found to have oxygen desaturation to 86% on  room air, requiring 4L oxygen.  Chest x-ray showed large left pleural effusion with likely left lung atelectasis/consolidation causing diffuse opacification of the left hemithorax and progressing since prior study and acute appearing left rib fractures are unchanged since prior study. Pt will need surgery tomorrow. I wonder if you could possibly please help to place a chest tube for this pt?   Closed left hip fracture [facility]): CT scan showed comminuted fractures of the left femoral neck extending to the lesser trochanter and mild varus angulation. Patient has moderate pain now. No neurovascular compromise. Orthopedic surgeon, Dr. Lydia Sams was consulted.  Given patient's old age, history of dHF, 4L of new oxygen requirement, and multiple comorbidities, will consult [facility] cardiology for presurgical clearance.  Epic message sent to CV DIV [facility] consult pool for [facility] cardiology consult.  - will admit to Med-surg bed as inpt - Pain control: prn morphine , percocet and tyleno - When necessary Zofran  for nausea - Robaxin  for muscle spasm - Lidoderm  patch for pain - type and cross - INR/PTT  Fall at home (SNF), initial encounter -PT/OT when able to (not ordered now)  Chronic diastolic CHF (congestive heart failure) (HCC): 2D echo on 06/06/2017 showed EF of 65-70% with grade 1 diastolic dysfunction.  Patient has 1+ leg edema, but BNP is normal 6065, does not seem to have CHF exacerbation.  Patient has new 4ls oxygen requirement, which is likely due to large left pleural effusion. -  will continue home torsimde - Follow-up [facility] cardiology's recommendation         Oxygen desaturation   Pleural effusion on left   BPH (benign prostatic hyperplasia)   Essential hypertension   Dyslipidemia   Alzheimer's dementia (HCC)   Chronic kidney disease, stage 3b (HCC)   Hairy cell leukemia, in remission (HCC)   Smoldering myeloma   Myelodysplasia present in bone marrow (HCC)   Myelodysplastic syndrome, low grade  (HCC)   Multiple myeloma not having achieved remission (HCC)   Pancytopenia (HCC)             DVT ppx: SCD  Code Status: DNR (pt has DNR form from SNF)  Family Communication:  Yes, patient's son by phone   Disposition Plan:  Anticipate discharge back to SNF  Consults called: Dr. Lydia Sams of ortho. Epic message sent to CV DIV [facility] consult pool for [place] cardiology consult.  Admission status and Level of care: Progressive:  as inpt        Dispo: The patient is from: SNF              Anticipated d/c is to: SNF              Anticipated d/c date is: 2 days              Patient currently is not medically stable to d/c.    Severity of Illness:  The appropriate patient status for this patient is INPATIENT. Inpatient status is judged to be reasonable and necessary in order to provide the required intensity of service to ensure the patient's safety. The patient's presenting symptoms, physical exam findings, and initial radiographic and laboratory data in the context of their chronic comorbidities is felt to place them at high risk for further clinical deterioration. Furthermore, it is not anticipated that the patient will be medically stable for discharge from the hospital within 2 midnights of admission.   * I certify that at the point of admission it is my clinical judgment that the patient will require inpatient hospital care spanning beyond 2 midnights from the point of admission due to high intensity of service, high risk for further deterioration and high frequency of surveillance required.*       Date of Service 01/18/2024    Fidencio Hue [facility]   If 7PM-7AM, please contact night-coverage [email or web address] 01/18/2024, 11:48 PM

## 2024-01-18 NOTE — H&P (Incomplete)
 History and Physical    Alex Smith ZOX:[id number] DOB: July 31, 1935 DOA: 01/18/2024  Referring MD/NP/PA:   PCP: [facility]   Patient coming from:  The patient is coming from SNF   Chief Complaint: fall and left hip pain.  HPI: Alex Smith is a 89 y.o. male with medical history significant of dCHF, HTN, HLD, dCHF, GERD, dementia, BPH, CKD-3b, depression pancytopenia, smoldering myeloma,  hairy cell leukemia in remission, myelodysplastic syndrome, low grade, multiple myeloma, myelodysplastic syndrome, low grader, on immunotherapy, recent admission due to fall and left 3-9th rib fracture in April (4/13-4/22), who presents with fall and left hip pain.  Patient has history of dementia, but he is alert, and answered most questions appropriately during the interview. Pt states that he accidentally fell when he was turning around in facility this afternoon.  Denies LOC.  Per his son, pharmacy technician in facility reported that the patient possibly hit his head.  He developed pain in the left hip, which is constant, sharp, severe, nonradiating, aggravated by movement.  No leg numbness or weakness. Patient is on aspirin  but no other blood thinners  Patient denies SOB, but he was found to have oxygen desaturation to 86% on room air per nurse report.  Oxygen improved to 95% on 4L oxygen.  Patient denies chest pain, cough.  No fever or chills.  Denies nausea, vomiting, diarrhea or abdominal pain.  No symptoms of UTI.  Data reviewed independently and ED Course: pt was found to have pancytopenia with WBC 3.9, hemoglobin 9.5, platelet 82 (patient had WBC 3.8, hemoglobin 8, platelets 115 on 12/29/2023), renal function stable, temperature normal, blood pressure 170/82, heart rate 100, RR 24.  Patient is admitted to telemetry bed as inpatient.  Dr. Lydia Sams of Ortho is consulted. Epic message sent to CV [facility] consult pool for [facility] cardiology consult.   CT-left hip: 1. Comminuted fractures of  the left femoral neck extending to the lesser trochanter. Mild varus angulation.   X-ray of left hip/pelvis: Findings suspicious for nondisplaced left femoral intertrochanteric fracture  CXR: Large left pleural effusion with likely left lung atelectasis/consolidation causing diffuse opacification of the left hemithorax and progressing since prior study. Acute appearing left rib fractures are unchanged since prior study.    EKG: I have personally reviewed.  Sinus rhythm, low voltage, poor R wave progression, anteroseptal infarction pattern, seems to have electric alternans   Review of Systems:   General: no fevers, chills, no body weight gain, has fatigue HEENT: no blurry vision, hearing changes or sore throat Respiratory: no dyspnea, coughing, wheezing CV: no chest pain, no palpitations GI: no nausea, vomiting, abdominal pain, diarrhea, constipation GU: no dysuria, burning on urination, increased urinary frequency, hematuria  Ext: has leg edema Neuro: no unilateral weakness, numbness, or tingling, no vision change or hearing loss. Has fall Skin: no rash, no skin tear. MSK: has left hip pain. Heme: No easy bruising.  Travel history: No recent long distant travel.   Allergy:  Allergies  Allergen Reactions   Rituxan [Rituximab] Rash and Other (See Comments)    Chest tightness    Blood-Group Specific Substance Other (See Comments)    Had a post transfusion reaction of red blood cells; NOW REQUIRES WASHED BLOOD CELLS   Primaxin Iv [Imipenem-Cilastatin] Other (See Comments)    Unknown reaction   Primaxin [Imipenem] Other (See Comments)    Unknown reaction Tolerates cephalosporins   Vfend [Voriconazole] Other (See Comments)    Unknown reaction   Sulfa Antibiotics  Itching and Rash   Sulfacetamide Sodium Itching and Rash    Past Medical History:  Diagnosis Date   Actinic keratosis    Alzheimer's dementia (HCC) 08/18/2021   Anemia    B12 deficiency    Basal cell carcinoma  03/30/2018   left ant nasal ala   Basal cell carcinoma 06/25/2023   R cheek, schedule for [facility]   Basal cell carcinoma of face 01/19/2013   right distal dorsum nose   BPH (benign prostatic hypertrophy)    followed by urology, discharged (Dr. Cherylene Corrente)   CAP (community acquired pneumonia) 02/15/2015   CKD stage 3 due to type 2 diabetes mellitus (HCC) 11/14/2017   Colon polyps    Depression with anxiety 01/19/2024   Diverticulosis    Dysplastic nevus 10/27/2006   right med calf   GERD (gastroesophageal reflux disease)    Hairy cell leukemia (HCC) 2006   recurrent, seizure on rituxan, now on cladribine  Beverely B[place])   History of pneumonia 2000's   "once" (07/07/2012)   History of shingles    HLD (hyperlipidemia)    Hypertension    Pneumonia    Shortness of breath dyspnea    Squamous cell carcinoma in situ 07/05/2020   Left lat. pretibial. 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 EDC 09/07/2020   Squamous cell carcinoma of skin 04/03/2020   Left cheek. WD SCC   Systolic murmur 05/28/2016   Type 2 diabetes, controlled, with retinopathy (HCC)     Past Surgical History:  Procedure Laterality Date   25 GAUGE PARS PLANA VITRECTOMY WITH 20 GAUGE MVR PORT FOR MACULAR HOLE  07/07/2012   Procedure: 25 GAUGE PARS PLANA VITRECTOMY WITH 20 GAUGE MVR PORT FOR MACULAR HOLE;  Surgeon: Rexene Catching, MD;  Location: [facility] OR;  Service: Ophthalmology;  Laterality: Left;   BONE MARROW BIOPSY  2016   CARDIOVASCULAR STRESS TEST  2013   treadmill - no evidence ischemia, EF 61%   CATARACT EXTRACTION W/ INTRAOCULAR LENS  IMPLANT, BILATERAL  ~ 2010   COLONOSCOPY  2014   Elliot WNL no rpt needed, h/o polyps   EYE SURGERY Left 06/2012   laser surgery   GAS INSERTION  07/07/2012   Procedure: INSERTION OF GAS;  Surgeon: Rexene Catching, MD;  Location: [facility] OR;  Service: Ophthalmology;  Laterality: Left;  [id number]   PERIPHERAL VASCULAR CATHETERIZATION N/A 02/23/2015    Procedure: Melville Stade Cath Insertion;  Surgeon: Celso College, MD;  Location: [facility] INVASIVE CV LAB;  Service: Cardiovascular;  Laterality: N/A;   PORTA CATH REMOVAL N/A 02/16/2020   Procedure: PORTA CATH REMOVAL;  Surgeon: Celso College, MD;  Location: [facility] INVASIVE CV LAB;  Service: Cardiovascular;  Laterality: N/A;   SERUM PATCH  07/07/2012   Procedure: SERUM PATCH;  Surgeon: Rexene Catching, MD;  Location: [facility] OR;  Service: Ophthalmology;  Laterality: Left;   SKIN CANCER EXCISION     "all over my face" (07/07/2012)    Social History:  reports that he quit smoking about 54 years ago. His smoking use included cigarettes. He started smoking about 80 years ago. He has a 50 pack-year smoking history. He has never used smokeless tobacco. He reports that he does not currently use alcohol. He reports that he does not use drugs.  Family History:  Family History  Problem Relation Age of Onset   Dementia Mother    Heart failure Father 55   Cancer Sister  breast   Diabetes Paternal Uncle    Diabetes Paternal Aunt    CAD Brother 56       MI   Stroke Neg Hx      Prior to Admission medications   Medication Sig Start Date End Date Taking? Authorizing Provider  acetaminophen  (TYLENOL ) 500 MG tablet Take 2 tablets (1,000 mg total) by mouth 2 (two) times daily. 11/27/22   Verla Glaze, MD  albuterol  (VENTOLIN  HFA) 108 (90 Base) MCG/ACT inhaler Inhale 2 puffs into the lungs every 6 (six) hours as needed for wheezing or shortness of breath. 04/20/22   Garnet Just, MD  alum & mag hydroxide-simeth (MAALOX/MYLANTA) 200-200-20 MG/5ML suspension Take 30 mLs by mouth every 4 (four) hours as needed for indigestion or heartburn. Patient taking differently: Take 30 mLs by mouth 4 (four) times daily as needed for indigestion or heartburn. 04/20/22   Garnet Just, MD  amLODipine  (NORVASC ) 10 MG tablet Take 1 tablet (10 mg total) by mouth daily. 04/20/22   Garnet Just, MD  amoxicillin-clavulanate (AUGMENTIN) 875-125 MG  tablet Take 1 tablet by mouth 2 (two) times daily.    [provider]  aspirin  EC 81 MG tablet Take 81 mg by mouth daily.    [provider]  donepezil  (ARICEPT ) 5 MG tablet Take 5 mg by mouth daily.    [provider]  doxycycline (ADOXA) 100 MG tablet Take 100 mg by mouth 2 (two) times daily.    [provider]  famotidine  (PEPCID ) 20 MG tablet Take 20 mg by mouth daily.    [provider]  guaiFENesin  (GERI-TUSSIN) 100 MG/5ML liquid Take 15 mLs by mouth every 6 (six) hours as needed for cough.    [provider]  insulin  glargine (LANTUS ) 100 UNIT/ML Solostar Pen Inject 14 Units into the skin at bedtime. Patient taking differently: Inject 8 Units into the skin at bedtime. 09/18/23   Lorita Rosa, MD  insulin  lispro (HUMALOG ) 100 UNIT/ML KwikPen Inject 2 Units into the skin 3 (three) times daily before meals. Per sliding scale Patient taking differently: Inject 7 Units into the skin 3 (three) times daily before meals. 11/27/22   Verla Glaze, MD  loperamide  (IMODIUM  A-D) 2 MG tablet Take 4 mg by mouth See admin instructions. Give 2 tablets (4mg ) by mouth after each loose stool as needed. Limit 8 tablets in 24 hours.    [provider]  losartan  (COZAAR ) 25 MG tablet Take 1 tablet (25 mg total) by mouth daily. 11/27/22 01/24/24  Verla Glaze, MD  Luspatercept -aamt (REBLOZYL  Cayce) Inject 100 mg into the skin See admin instructions. Infuse 100mg  into vein, every 3 weeks at oncology office.    [provider]  magnesium  hydroxide (MILK OF MAGNESIA) 400 MG/5ML suspension Take 30 mLs by mouth daily as needed (constipation).    [provider]  Melatonin 10 MG TABS Take 10 mg by mouth at bedtime.    [provider]  Multiple Vitamin (MULTIVITAMIN WITH MINERALS) TABS tablet Take 1 tablet by mouth daily. 08/23/21   Lesa Rape, MD  oxyCODONE  (OXY IR/ROXICODONE ) 5 MG immediate release tablet Take 0.5-1 tablets (2.5-5  mg total) by mouth every 4 (four) hours as needed for moderate pain (pain score 4-6) or severe pain (pain score 7-10) (2.5 mg for moderate, 5 mg for severe). 12/09/23   Maczis, Michael M, PA-C  pantoprazole  (PROTONIX ) 40 MG tablet Take 1 tablet (40 mg total) by mouth 2 (two) times daily. 04/20/22   Garnet Just, MD  pravastatin  (PRAVACHOL ) 20 MG tablet Take 20 mg by mouth at bedtime.    [provider]  sertraline  (ZOLOFT ) 25 MG tablet Take 25 mg by mouth daily.    [provider]  tamsulosin  (FLOMAX ) 0.4 MG CAPS capsule TAKE 1 CAPSULE BY MOUTH EVERY DAY Patient taking differently: Take 0.4 mg by mouth at bedtime. 12/11/17   Claire Crick, MD  torsemide  (DEMADEX ) 20 MG tablet Take 20 mg by mouth daily.    [provider]  triamcinolone cream (KENALOG) 0.1 % Apply 1 Application topically 2 (two) times daily. Apply liberally to bilateral lower legs.    [provider]  UNABLE TO FIND Apply 1 application  topically daily. Remove dressing and cleanse the foot, toes and wound with Vashe or saline or distilled water. Put Betadine on gauze and place on wound. Cover it with dry gauze then ABD. Place a dry 4x4 gauze on anterior ankle to protect it. Wrap with Kerlix then loose ACE. Secure with tape. Do this daily for the left 2nd toe.    [provider]  vitamin B-12 (CYANOCOBALAMIN ) 1000 MCG tablet Take 1 tablet (1,000 mcg total) by mouth daily. 06/01/19   Claire Crick, MD  vitamin E  400 UNIT capsule Take 400 Units by mouth daily.    [provider]    Physical Exam: Vitals:   01/18/24 2130 01/18/24 2200 01/18/24 2315 01/18/24 2345  BP: (!) 179/102 (!) 170/82  (!) 150/81  Pulse: 71 100 96 98  Resp: (!) 23 (!) 22 (!) 31 17  Temp:    98.2 F (36.8 C)  TempSrc:    Oral  SpO2: 95% 93% 93% 94%   General: Not in acute distress HEENT:       Eyes: PERRL, EOMI, no jaundice       ENT: No discharge from the ears and nose, no pharynx injection, no  tonsillar enlargement.        Neck: No JVD, no bruit, no mass felt. Heme: No neck lymph node enlargement. Cardiac: S1/S2, RRR, 2/5 systolic murmurs, No gallops or rubs. Respiratory: has decreased air movement on the left side GI: Soft, nondistended, nontender, no rebound pain, no organomegaly, BS present. GU: No hematuria Ext: 1+ pitting leg edema bilaterally. 1+DP/PT pulse bilaterally. Musculoskeletal: has left hip tenderness Skin: No rashes.  Neuro: Alert, oriented X3, cranial nerves II-XII grossly intact, moves all extremities normally. Psych: Patient is not psychotic, no suicidal or hemocidal ideation.  Labs on Admission: I have personally reviewed following labs and imaging studies  CBC: Recent Labs  Lab 01/18/24 2053  WBC 3.9*  NEUTROABS 2.3  HGB 9.5*  HCT 29.9*  MCV 111.6*  PLT 82*   Basic Metabolic Panel: Recent Labs  Lab 01/18/24 2053  NA 135  K 4.3  CL 109  CO2 20*  GLUCOSE 171*  BUN 20  CREATININE 1.26*  CALCIUM  8.3*   GFR: CrCl cannot be calculated (Unknown ideal weight.). Liver Function Tests: No results for input(s): "AST", "ALT", "ALKPHOS", "BILITOT", "PROT", "ALBUMIN" in the last 168 hours. No results for input(s): "LIPASE", "AMYLASE" in the last 168 hours. No results for input(s): "AMMONIA" in the last 168 hours. Coagulation Profile: Recent Labs  Lab 01/18/24 2053  INR 1.0   Cardiac Enzymes: No results for input(s): "CKTOTAL", "CKMB", "CKMBINDEX", "TROPONINI" in the last 168 hours. BNP (last 3 results) No results for input(s): "PROBNP" in the last 8760 hours. HbA1C: No results for input(s): "HGBA1C" in the last 72 hours. CBG: No results  for input(s): "GLUCAP" in the last 168 hours. Lipid Profile: No results for input(s): "CHOL", "HDL", "LDLCALC", "TRIG", "CHOLHDL", "LDLDIRECT" in the last 72 hours. Thyroid  Function Tests: No results for input(s): "TSH", "T4TOTAL", "FREET4", "T3FREE", "THYROIDAB" in the last 72 hours. Anemia Panel: No  results for input(s): "VITAMINB12", "FOLATE", "FERRITIN", "TIBC", "IRON", "RETICCTPCT" in the last 72 hours. Urine analysis:    Component Value Date/Time   COLORURINE YELLOW 12/04/2023 1000   APPEARANCEUR HAZY (A) 12/04/2023 1000   LABSPEC 1.015 12/04/2023 1000   PHURINE 5.0 12/04/2023 1000   GLUCOSEU NEGATIVE 12/04/2023 1000   HGBUR NEGATIVE 12/04/2023 1000   BILIRUBINUR NEGATIVE 12/04/2023 1000   BILIRUBINUR Negative 08/02/2016 1212   KETONESUR NEGATIVE 12/04/2023 1000   PROTEINUR 30 (A) 12/04/2023 1000   UROBILINOGEN 0.2 08/02/2016 1212   NITRITE NEGATIVE 12/04/2023 1000   LEUKOCYTESUR NEGATIVE 12/04/2023 1000   Sepsis Labs: @LABRCNTIP (procalcitonin:4,lacticidven:4) )No results found for this or any previous visit (from the past 240 hours).   Radiological Exams on Admission:   Assessment/Plan Principal Problem:   Closed left hip fracture [facility]) Active Problems:   Fall at home, initial encounter   Chronic diastolic CHF (congestive heart failure) (HCC)   Oxygen desaturation   Pleural effusion on left   Left rib fracture   BPH (benign prostatic hyperplasia)   Essential hypertension   Dyslipidemia   Alzheimer's dementia (HCC)   Chronic kidney disease, stage 3b (HCC)   Hairy cell leukemia, in remission (HCC)   Smoldering myeloma   Myelodysplasia present in bone marrow (HCC)   Myelodysplastic syndrome, low grade (HCC)   Multiple myeloma not having achieved remission (HCC)   Pancytopenia (HCC)   Depression with anxiety   Type II diabetes mellitus with renal manifestations (HCC)   Assessment and Plan:   Closed left hip fracture [facility]): CT scan showed comminuted fractures of the left femoral neck extending to the lesser trochanter and mild varus angulation. Patient has moderate pain now. No neurovascular compromise. Orthopedic surgeon, Dr. Lydia Sams was consulted.  Given patient's old age, history of dHF, 4L of new oxygen requirement, and multiple comorbidities, will consult  [facility] cardiology for presurgical clearance.  Epic message sent to CV DIV [facility] consult pool for [facility] cardiology consult.  - will admit to med-surg bed with cardiac monitoring and continuous pulse Ox as inpt - Pain control: prn morphine , percocet and tyleno - When necessary Zofran  for nausea - Robaxin  for muscle spasm - Lidoderm  patch for pain - type and cross - INR/PTT  Fall at home (SNF), initial encounter -PT/OT when able to (not ordered now) -f/u CT-head  Chronic diastolic CHF (congestive heart failure) (HCC): 2D echo on 06/06/2017 showed EF of 65-70% with grade 1 diastolic dysfunction.  Patient has 1+ leg edema, but BNP is normal 6065, does not seem to have CHF exacerbation.  Patient has new 4ls oxygen requirement, which is likely due to large left pleural effusion. -will continue home torsimde - Follow-up [facility] cardiology's recommendation  Oxygen desaturation due to large pleural effusion on left:  CXR showed large left pleural effusion with likely left lung atelectasis/consolidation causing diffuse opacification of the left hemithorax and progressing since prior study, acute appearing left rib fractures are unchanged since prior study. Consulted ICU/pulmonology NP, [facility] who recommended to get CT of chest for further evaluation for possible chest tube placement - Pain control as above - Incentive spirometry - Stat CT scan of chest - Bronchodilators and as needed Mucinex  - Nasal cannula oxygen to maintain oxygen saturation above  93%  Left rib fracture: - Pain control as above - Incentive spirometry  BPH (benign prostatic hyperplasia) -Flomax   Essential hypertension -IV hydralazine  as needed - Amlodipine , torsemide   Dyslipidemia -Pravastatin   Alzheimer's dementia (HCC) -Fall precaution -Donepezil   Chronic kidney disease, stage 3b (HCC): Renal function stable.  Recent baseline creatinine 1.41 on/21/25.  His creatinine is 1.26, BUN 20, GFR 55. - Follow-up with  BMP  Pancytopenia and multiple blood disorder including hairy cell leukemia, in remission, smoldering myeloma, myelodysplasia present in bone marrow, myelodysplastic syndrome, low grade, multiple myeloma not having achieved remission: pt is currently on immunotherapy (Rebloxyl) q3week. Hgb stable 9.5 (8.0 on 12/29/2023) - Follow-up with oncology, Dr. Randy Buttery  Depression with anxiety - Continue home Zoloft  and as needed Ativan   Type II diabetes mellitus with renal manifestations [facility]): Recent A1c.  Patient is taking NovoLog  and Lantus  8 units daily - Sliding scale insulin  - Glargine insulin  6 units daily        DVT ppx: SCD  Code Status: DNR (pt has DNR form from SNF)  Family Communication:  Yes, patient's son by phone   Disposition Plan:  Anticipate discharge back to SNF  Consults called: Dr. Lydia Sams of ortho. ICU NP, [facility]. Epic message sent to CV DIV [facility] consult pool for [facility] cardiology consult.  Admission status and Level of care: Progressive:  as inpt        Dispo: The patient is from: SNF              Anticipated d/c is to: SNF              Anticipated d/c date is: 2 days              Patient currently is not medically stable to d/c.    Severity of Illness:  The appropriate patient status for this patient is INPATIENT. Inpatient status is judged to be reasonable and necessary in order to provide the required intensity of service to ensure the patient's safety. The patient's presenting symptoms, physical exam findings, and initial radiographic and laboratory data in the context of their chronic comorbidities is felt to place them at high risk for further clinical deterioration. Furthermore, it is not anticipated that the patient will be medically stable for discharge from the hospital within 2 midnights of admission.   * I certify that at the point of admission it is my clinical judgment that the patient will require inpatient hospital care spanning beyond 2 midnights from the  point of admission due to high intensity of service, high risk for further deterioration and high frequency of surveillance required.*       Date of Service 01/19/2024    Fidencio Hue [facility]   If 7PM-7AM, please contact night-coverage [email or web address] 01/19/2024, 12:28 AM

## 2024-01-18 NOTE — ED Provider Notes (Signed)
 [facility] Provider Note    Event Date/Time   First MD Initiated Contact with Patient 01/18/24 2057     (approximate)   History   Fall   HPI  Alex Smith is a 89 y.o. male who presents to the emergency department today because of concerns for left hip pain after a fall.  The patient states he was just turning around when he fell.  He denies pain anywhere else.  Denies any pain moving down his leg or numbness in his leg.  Patient is on aspirin  but no other blood thinners. Denies any recent illness, fever, chest pain or palpitations.      Physical Exam   Triage Vital Signs: ED Triage Vitals  Encounter Vitals Group     BP 01/18/24 2053 (!) 160/108     Systolic BP Percentile --      Diastolic BP Percentile --      Pulse Rate 01/18/24 2053 96     Resp 01/18/24 2053 20     Temp 01/18/24 2053 98 F (36.7 C)     Temp Source 01/18/24 2053 Oral     SpO2 01/18/24 2053 91 %     Weight --      Height --      Head Circumference --      Peak Flow --      Pain Score 01/18/24 2049 8     Pain Loc --      Pain Education --      Exclude from Growth Chart --     Most recent vital signs: Vitals:   01/18/24 2053  BP: (!) 160/108  Pulse: 96  Resp: 20  Temp: 98 F (36.7 C)  SpO2: 91%   General: Awake, alert, oriented. CV:  Good peripheral perfusion. Regular rate and rhythm. Resp:  Normal effort. Lungs clear. Abd:  No distention.  Other:  Left hip with some tenderness, left leg externally rotated. NV intact distally.   ED Results / Procedures / Treatments   Labs (all labs ordered are listed, but only abnormal results are displayed) Labs Reviewed  CBC WITH DIFFERENTIAL/PLATELET - Abnormal; Notable for the following components:      Result Value   WBC 3.9 (*)    RBC 2.68 (*)    Hemoglobin 9.5 (*)    HCT 29.9 (*)    MCV 111.6 (*)    MCH 35.4 (*)    RDW 17.4 (*)    Platelets 82 (*)    nRBC 0.8 (*)    All other components within normal limits   BASIC METABOLIC PANEL WITH GFR - Abnormal; Notable for the following components:   CO2 20 (*)    Glucose, Bld 171 (*)    Creatinine, Ser 1.26 (*)    Calcium  8.3 (*)    GFR, Estimated 55 (*)    All other components within normal limits  RESP PANEL BY RT-PCR (RSV, FLU A&B, COVID)  RVPGX2  PROTIME-INR  BRAIN NATRIURETIC PEPTIDE  APTT  CBC  BASIC METABOLIC PANEL WITH GFR  TYPE AND SCREEN     EKG  I, Marylynn Soho, attending physician, personally viewed and interpreted this EKG  EKG Time: 2137 Rate: 98 Rhythm: sinus rhythm Axis: normal Intervals: qtc 466 QRS: narrow, q waves v1, v2, v3 ST changes: no st elevation Impression: abnormal ekg   RADIOLOGY I independently interpreted and visualized the left hip. My interpretation: left hip fracture Radiology interpretation:  IMPRESSION:  Findings suspicious for  nondisplaced left femoral intertrochanteric  fracture.      PROCEDURES:  Critical Care performed: No   MEDICATIONS ORDERED IN ED: Medications - No data to display   IMPRESSION / MDM / ASSESSMENT AND PLAN / ED COURSE  I reviewed the triage vital signs and the nursing notes.                              Differential diagnosis includes, but is not limited to, fracture, dislocation, contusion  Patient's presentation is most consistent with acute presentation with potential threat to life or bodily function.   Patient presented to the emergency department today because of concerns for left hip pain after a fall.  Exam is concerning for left hip fracture.  Hip x-ray was performed and is consistent with left intertrochanteric fracture.  Discussed with Dr. Lydia Sams with orthopedic surgery who requested CT be performed.  Additionally asked patient to get 1 g of TXA to help prevent operative bleeding.  Discussed with Dr. Rosalea Collin with the hospitalist service who will evaluate for admission.      FINAL CLINICAL IMPRESSION(S) / ED DIAGNOSES   Final diagnoses:  Closed  fracture of left hip, initial encounter (HCC)        Rx / DC Orders     Note:  This document was prepared using Dragon voice recognition software and may include unintentional dictation errors.    Marylynn Soho, MD 01/18/24 [phone]

## 2024-01-18 NOTE — ED Triage Notes (Signed)
 Pt arrives via [facility] from the [facility] for a fall. Pt reportedly tripped, and injured his L hip. Pt denies LOC

## 2024-01-19 ENCOUNTER — Inpatient Hospital Stay

## 2024-01-19 ENCOUNTER — Telehealth: Payer: Self-pay | Admitting: Oncology

## 2024-01-19 ENCOUNTER — Encounter: Payer: Self-pay | Admitting: Internal Medicine

## 2024-01-19 DIAGNOSIS — F418 Other specified anxiety disorders: Secondary | ICD-10-CM

## 2024-01-19 DIAGNOSIS — J90 Pleural effusion, not elsewhere classified: Secondary | ICD-10-CM

## 2024-01-19 DIAGNOSIS — I5032 Chronic diastolic (congestive) heart failure: Secondary | ICD-10-CM | POA: Diagnosis not present

## 2024-01-19 DIAGNOSIS — R9431 Abnormal electrocardiogram [ECG] [EKG]: Secondary | ICD-10-CM

## 2024-01-19 DIAGNOSIS — S72002A Fracture of unspecified part of neck of left femur, initial encounter for closed fracture: Secondary | ICD-10-CM | POA: Diagnosis not present

## 2024-01-19 DIAGNOSIS — Z01810 Encounter for preprocedural cardiovascular examination: Secondary | ICD-10-CM

## 2024-01-19 DIAGNOSIS — E1129 Type 2 diabetes mellitus with other diabetic kidney complication: Secondary | ICD-10-CM | POA: Diagnosis present

## 2024-01-19 DIAGNOSIS — N1832 Chronic kidney disease, stage 3b: Secondary | ICD-10-CM | POA: Diagnosis not present

## 2024-01-19 HISTORY — DX: Other specified anxiety disorders: F41.8

## 2024-01-19 LAB — BODY FLUID CELL COUNT WITH DIFFERENTIAL
Eos, Fluid: 0 %
Lymphs, Fluid: 73 %
Monocyte-Macrophage-Serous Fluid: 15 %
Neutrophil Count, Fluid: 12 %
Total Nucleated Cell Count, Fluid: 325 uL

## 2024-01-19 LAB — CBC
HCT: 26.9 % — ABNORMAL LOW (ref 39.0–52.0)
Hemoglobin: 8.7 g/dL — ABNORMAL LOW (ref 13.0–17.0)
MCH: 35.5 pg — ABNORMAL HIGH (ref 26.0–34.0)
MCHC: 32.3 g/dL (ref 30.0–36.0)
MCV: 109.8 fL — ABNORMAL HIGH (ref 80.0–100.0)
Platelets: 65 10*3/uL — ABNORMAL LOW (ref 150–400)
RBC: 2.45 MIL/uL — ABNORMAL LOW (ref 4.22–5.81)
RDW: 17.1 % — ABNORMAL HIGH (ref 11.5–15.5)
WBC: 3.7 10*3/uL — ABNORMAL LOW (ref 4.0–10.5)
nRBC: 0 % (ref 0.0–0.2)

## 2024-01-19 LAB — RESP PANEL BY RT-PCR (RSV, FLU A&B, COVID)  RVPGX2
Influenza A by PCR: NEGATIVE
Influenza B by PCR: NEGATIVE
Resp Syncytial Virus by PCR: NEGATIVE
SARS Coronavirus 2 by RT PCR: NEGATIVE

## 2024-01-19 LAB — LACTATE DEHYDROGENASE, PLEURAL OR PERITONEAL FLUID: LD, Fluid: 142 U/L — ABNORMAL HIGH (ref 3–23)

## 2024-01-19 LAB — BASIC METABOLIC PANEL WITH GFR
Anion gap: 5 (ref 5–15)
BUN: 19 mg/dL (ref 8–23)
CO2: 21 mmol/L — ABNORMAL LOW (ref 22–32)
Calcium: 8.1 mg/dL — ABNORMAL LOW (ref 8.9–10.3)
Chloride: 111 mmol/L (ref 98–111)
Creatinine, Ser: 1.09 mg/dL (ref 0.61–1.24)
GFR, Estimated: >60 mL/min (ref >60)
Glucose, Bld: 102 mg/dL — ABNORMAL HIGH (ref 70–99)
Potassium: 4.1 mmol/L (ref 3.5–5.1)
Sodium: 137 mmol/L (ref 135–145)

## 2024-01-19 LAB — GLUCOSE, PLEURAL OR PERITONEAL FLUID: Glucose, Fluid: 138 mg/dL

## 2024-01-19 LAB — PROTEIN, PLEURAL OR PERITONEAL FLUID: Total protein, fluid: 3.7 g/dL

## 2024-01-19 LAB — GLUCOSE, CAPILLARY
Glucose-Capillary: 109 mg/dL — ABNORMAL HIGH (ref 70–99)
Glucose-Capillary: 155 mg/dL — ABNORMAL HIGH (ref 70–99)
Glucose-Capillary: 131 mg/dL — ABNORMAL HIGH (ref 70–99)
Glucose-Capillary: 103 mg/dL — ABNORMAL HIGH (ref 70–99)
Glucose-Capillary: 180 mg/dL — ABNORMAL HIGH (ref 70–99)

## 2024-01-19 LAB — MAGNESIUM: Magnesium: 2.1 mg/dL (ref 1.7–2.4)

## 2024-01-19 LAB — APTT: aPTT: 32 s (ref 24–36)

## 2024-01-19 MED ORDER — SERTRALINE HCL 50 MG PO TABS
25.0000 mg | ORAL_TABLET | Freq: Every day | ORAL | Status: DC
  Administered 2024-01-19 – 2024-01-21 (×2): 25 mg via ORAL
  Filled 2024-01-19 (×3): qty 1

## 2024-01-19 MED ORDER — TRIAMCINOLONE ACETONIDE 0.1 % EX CREA
1.0000 | TOPICAL_CREAM | Freq: Two times a day (BID) | CUTANEOUS | Status: DC
  Administered 2024-01-19 – 2024-01-21 (×6): 1 via TOPICAL
  Filled 2024-01-19 (×2): qty 15

## 2024-01-19 MED ORDER — DONEPEZIL HCL 5 MG PO TABS: 5.0000 mg | ORAL_TABLET | Freq: Every day | ORAL | Status: DC

## 2024-01-19 MED ORDER — VITAMIN B-12 1000 MCG PO TABS
1000.0000 ug | ORAL_TABLET | Freq: Every day | ORAL | Status: DC
  Administered 2024-01-19 – 2024-01-21 (×2): 1000 ug via ORAL
  Filled 2024-01-19 (×3): qty 1

## 2024-01-19 MED ORDER — LIDOCAINE HCL (PF) 1 % IJ SOLN
10.0000 mL | Freq: Once | INTRAMUSCULAR | Status: AC
  Administered 2024-01-19: 10 mL via INTRADERMAL

## 2024-01-19 MED ORDER — TAMSULOSIN HCL 0.4 MG PO CAPS
0.4000 mg | ORAL_CAPSULE | Freq: Every day | ORAL | Status: DC
  Administered 2024-01-19 – 2024-01-21 (×3): 0.4 mg via ORAL
  Filled 2024-01-19 (×3): qty 1

## 2024-01-19 MED ORDER — VITAMIN C 500 MG PO TABS
500.0000 mg | ORAL_TABLET | Freq: Every day | ORAL | Status: DC
  Administered 2024-01-19 – 2024-01-21 (×2): 500 mg via ORAL
  Filled 2024-01-19 (×3): qty 1

## 2024-01-19 MED ORDER — PRAVASTATIN SODIUM 20 MG PO TABS
20.0000 mg | ORAL_TABLET | Freq: Every day | ORAL | Status: DC
  Administered 2024-01-19 – 2024-01-21 (×3): 20 mg via ORAL
  Filled 2024-01-19 (×3): qty 1

## 2024-01-19 MED ORDER — ALUM & MAG HYDROXIDE-SIMETH 200-200-20 MG/5ML PO SUSP: 30.0000 mL | Freq: Four times a day (QID) | ORAL | Status: DC | PRN

## 2024-01-19 MED ORDER — TORSEMIDE 20 MG PO TABS
20.0000 mg | ORAL_TABLET | Freq: Every day | ORAL | Status: DC
  Administered 2024-01-19 – 2024-01-21 (×2): 20 mg via ORAL
  Filled 2024-01-19 (×3): qty 1

## 2024-01-19 MED ORDER — AMLODIPINE BESYLATE 10 MG PO TABS
10.0000 mg | ORAL_TABLET | Freq: Every day | ORAL | Status: DC
  Administered 2024-01-19 – 2024-01-21 (×2): 10 mg via ORAL
  Filled 2024-01-19 (×3): qty 1

## 2024-01-19 MED ORDER — PANTOPRAZOLE SODIUM 40 MG PO TBEC
40.0000 mg | DELAYED_RELEASE_TABLET | Freq: Two times a day (BID) | ORAL | Status: DC
  Administered 2024-01-19 – 2024-01-21 (×5): 40 mg via ORAL
  Filled 2024-01-19 (×6): qty 1

## 2024-01-19 MED ORDER — ADULT MULTIVITAMIN W/MINERALS CH
1.0000 | ORAL_TABLET | Freq: Every day | ORAL | Status: DC
  Administered 2024-01-19 – 2024-01-21 (×2): 1 via ORAL
  Filled 2024-01-19 (×3): qty 1

## 2024-01-19 MED ORDER — ENSURE PLUS HIGH PROTEIN PO LIQD
237.0000 mL | Freq: Two times a day (BID) | ORAL | Status: DC
  Administered 2024-01-21 (×2): 237 mL via ORAL

## 2024-01-19 MED ORDER — INSULIN GLARGINE-YFGN 100 UNIT/ML ~~LOC~~ SOLN
6.0000 [IU] | Freq: Every day | SUBCUTANEOUS | Status: DC
  Administered 2024-01-19 – 2024-01-21 (×3): 6 [IU] via SUBCUTANEOUS
  Filled 2024-01-19 (×3): qty 0.06

## 2024-01-19 MED ORDER — MORPHINE SULFATE (PF) 2 MG/ML IV SOLN
1.0000 mg | INTRAVENOUS | Status: DC | PRN
  Administered 2024-01-19 – 2024-01-20 (×4): 1 mg via INTRAVENOUS
  Filled 2024-01-19 (×3): qty 1

## 2024-01-19 MED ORDER — NYSTATIN 100000 UNIT/GM EX CREA
1.0000 | TOPICAL_CREAM | Freq: Two times a day (BID) | CUTANEOUS | Status: DC
  Administered 2024-01-19 – 2024-01-21 (×6): 1 via TOPICAL
  Filled 2024-01-19: qty 30

## 2024-01-19 MED ORDER — LORAZEPAM 0.5 MG PO TABS: 0.5000 mg | ORAL_TABLET | Freq: Three times a day (TID) | ORAL | Status: DC | PRN

## 2024-01-19 NOTE — Telephone Encounter (Signed)
 Patients daughter called to let us  know he fell and broke his hip- she cancelled his appointment and will call to reschedule when he is out of rehab.

## 2024-01-19 NOTE — Progress Notes (Signed)
 Progress Note   Patient: Alex Smith WNU:[id number] DOB: March 02, 1935 DOA: 01/18/2024     1 DOS: the patient was seen and examined on 01/19/2024   Brief hospital course:  "RAYSHARD SCHIRTZINGER is a 89 y.o. male with medical history significant of dCHF, HTN, HLD, dCHF, GERD, dementia, BPH, CKD-3b, depression pancytopenia, smoldering myeloma,  hairy cell leukemia in remission, myelodysplastic syndrome, low grade, multiple myeloma, myelodysplastic syndrome, low grader, on immunotherapy, recent admission due to fall and left 3-9th rib fracture in April (4/13-4/22), who presents with fall and left hip pain. ..." See H&P for full HPI on admission & ED course.  Patient was admitted for further evaluation and management of  --Left hip fracture --Large left pleural effusion complicated by acute respiratory failure with hypoxia  Pulmonology was consulted for the pleural effusion and Cardiology for perioperative risk assessment.  Further hospital course and management as outlined below.    Assessment and Plan:  Closed left hip fracture [facility]): CT scan showed comminuted fractures of the left femoral neck extending to the lesser trochanter and mild varus angulation. Patient has moderate pain now. No neurovascular compromise. Orthopedic surgeon, Dr. Lydia Sams was consulted.  Given patient's old age, history of dHF, 4L of new oxygen requirement, and multiple comorbidities, will consult [facility] cardiology for presurgical clearance.  Epic message sent to CV DIV [facility] consult pool for [facility] cardiology consult. --Ortho is consulted --Surgery delayed for today for thoracentesis and cardiac pre-op evaluation --Anticipate surgery tomorrow --Diet today after thora, NPO after midnight --Pain control: prn morphine , percocet and tyleno --PRN Zofran  for nausea --Robaxin  for muscle spasm --Lidoderm  patch for pain    Fall at home [facility]), initial encounter CT head non-acute. --PT/OT post-op after hip repair --Fall precautions    Chronic diastolic CHF (congestive heart failure) (HCC): 2D echo on 06/06/2017 showed EF of 65-70% with grade 1 diastolic dysfunction.  Patient has 1+ leg edema, but BNP is normal 6065, does not seem to have CHF exacerbation.  Patient has new 4ls oxygen requirement, which is likely due to large left pleural effusion. -will continue home torsimde --Cardiology consulted for perioperative risk assessment --Follow up pending Echo --Monitor volume status   Oxygen desaturation due to large pleural effusion on left:  POA CXR showed large left pleural effusion with likely left lung atelectasis/consolidation causing diffuse opacification of the left hemithorax and progressing since prior study, acute appearing left rib fractures are unchanged since prior study.  --Pulmonology consulted --Thoracentesis ordered with IR for today - follow pleural fluid studies --Pain control as above --Incentive spirometry --Bronchodilators and as needed Mucinex  --Nasal cannula oxygen to maintain oxygen saturation above 93%   Left rib fractures, multiple:  --Pain control as above --Incentive spirometry  Type II diabetes mellitus with renal manifestations (HCC): Recent A1c.  Patient is taking NovoLog  and Lantus  8 units daily --Sliding scale insulin  --Glargine insulin  60 units daily   BPH (benign prostatic hyperplasia) --Flomax    Essential hypertension --IV hydralazine  as needed --Amlodipine , torsemide    Dyslipidemia --Pravastatin    Alzheimer's dementia (HCC) --Fall precaution --Donepezil    Chronic kidney disease, stage 3b: Renal function stable.  Recent baseline creatinine 1.41 on/21/25.  His creatinine is 1.26, BUN 20, GFR 55. --Follow-up with BMP   Pancytopenia and multiple blood disorder including hairy cell leukemia, in remission, smoldering myeloma, myelodysplasia present in bone marrow, myelodysplastic syndrome, low grade, multiple myeloma not having achieved remission: pt is currently on  immunotherapy (Rebloxyl) q3week.  Hgb stable 9.5 >> 8.7 (compared to 8.0 on  12/29/2023) --Follow-up with oncology, Dr. Randy Buttery   Depression with anxiety --Continue home Zoloft  and as needed Ativan          Subjective: Pt seen with daughter at bedside this AM.  He reports feeling okay.  Hip does not hurt unless he tries to move.  He denies feeling short of breath.  No other acute complaints.  Pt and daughter deny prior history of pleural effusions and ask if it could be related to his recent rib fractures.   Physical Exam: Vitals:   01/19/24 0432 01/19/24 0715 01/19/24 0805 01/19/24 1150  BP: (!) 152/92  137/64 130/60  Pulse: 96  95 98  Resp: 20  16   Temp: 98.9 F (37.2 C)  98.9 F (37.2 C)   TempSrc:      SpO2: 96% 98% 98% 97%  Weight:       General exam: awake, alert, no acute distress HEENT: moist mucus membranes, hearing grossly normal  Respiratory system: CTAB but diminished entire left side, no wheezes or rhonchi, normal respiratory effort on 4 L/min [place] O2 Cardiovascular system: normal S1/S2, RRR, 2/6 systolic murmur, no pedal edema.   Gastrointestinal system: soft, NT, ND, no HSM felt, +bowel sounds. Central nervous system: A&O x 2+. no gross focal neurologic deficits, normal speech Extremities: no edema, normal tone Skin: dry, intact, normal temperature, no rashes seen on visualized skin Psychiatry: normal mood, congruent affect, judgement and insight appear normal   Data Reviewed:  Notable labs --  Bicarb 21 Glucose 102 Ca 8.2 WBC 3.7 Hbg 8.7 Platelets 65k  Negative viral PCR for Covid, Flu and RSV  PENDING -- pleural fluid studies   Family Communication: daughter at bedside on rounds  Disposition: Status is: Inpatient Remains inpatient appropriate because: ongoing evaluation, surgery for hip fracture pending tomorrow   Planned Discharge Destination: Skilled nursing facility    Time spent: 45 minutes  Author: Montey Apa, DO 01/19/2024 12:33  PM  For on call review [email or web address].

## 2024-01-19 NOTE — Plan of Care (Signed)
  Problem: Education: Goal: Ability to describe self-care measures that may prevent or decrease complications (Diabetes Survival Skills Education) will improve Outcome: Progressing   Problem: Fluid Volume: Goal: Ability to maintain a balanced intake and output will improve Outcome: Progressing   Problem: Nutritional: Goal: Maintenance of adequate nutrition will improve Outcome: Progressing   Problem: Tissue Perfusion: Goal: Adequacy of tissue perfusion will improve Outcome: Progressing   Problem: Activity: Goal: Risk for activity intolerance will decrease Outcome: Progressing   Problem: Pain Managment: Goal: General experience of comfort will improve and/or be controlled Outcome: Progressing   Problem: Safety: Goal: Ability to remain free from injury will improve Outcome: Progressing

## 2024-01-19 NOTE — Progress Notes (Signed)
 Initial Nutrition Assessment  DOCUMENTATION CODES:   Not applicable  INTERVENTION:   -Once diet is advanced:  -MVI with minerals daily -Ensure Plus High Protein po BID, each supplement provides 350 kcal and 20 grams of protein  NUTRITION DIAGNOSIS:   Increased nutrient needs related to post-op healing as evidenced by estimated needs.  GOAL:   Patient will meet greater than or equal to 90% of their needs  MONITOR:   PO intake, Supplement acceptance  REASON FOR ASSESSMENT:   Consult Assessment of nutrition requirement/status, Hip fracture protocol  ASSESSMENT:   Pt with medical history significant of dCHF, HTN, HLD, dCHF, GERD, dementia, BPH, CKD-3b, depression pancytopenia, smoldering myeloma,  hairy cell leukemia in remission, myelodysplastic syndrome, low grade, multiple myeloma, myelodysplastic syndrome, low grader, on immunotherapy, recent admission due to fall and left 3-9th rib fracture in April (4/13-4/22), who presents with fall and left hip pain.  Pt admitted with closed lt hip fracture.   Reviewed I/O's: -250 ml x 24 hours  UOP: 350 ml x 24 hours   Per orthopedics notes, plan for surgery today. Pt is NPO for procedure.   Spoke with pt and daughter at bedside. Pt is pleasant and in good spirits today, reports feeling well today. Pt reports he lives at the [facility], where he has resided for 3 years. Pt reports he has a good appetite, however, does not like the food at the facility. He consumes all meals in the dining room. Per pt, he does not like fish.   Per pt daughter, pt with DM and tries to be conscious of sugar containing foods, however, diet has been liberalized to maximize oral intake.   Per pt, his UBW is around 180#. He does not think he has lost any weight, however, daughter reports for wt loss after hospitalization for rib fractures and rehab earlier this year. Reviewed wt hx; pt has experienced a 2.5% wt loss over the past 5 months, which  is not significant for time frame.   Discussed importance of meal and supplement intake to promote healing. Pt am,enable to supplements. Per daughter, pt drinks one Ensure daily PTA.   Medications reviewed and include vitamin C, vitamin B-12, protonix , and demadex .   Lab Results  Component Value Date   HGBA1C 7.2 (H) 08/13/2023   PTA DM medications are 8 units insulin  glargine daily and 7 units insulin  lispro TID.   Labs reviewed: CBGS: 109-155 (inpatient orders for glycemic control are 0-5 units insulin  aspart daily at bedtime, 0-9 units insulin  aspart TID with meals, and 6 units insulin  glargine- yfgn daily).    NUTRITION - FOCUSED PHYSICAL EXAM:  Flowsheet Row Most Recent Value  Orbital Region No depletion  Upper Arm Region Mild depletion  Thoracic and Lumbar Region No depletion  Buccal Region No depletion  Temple Region No depletion  Clavicle Bone Region No depletion  Clavicle and Acromion Bone Region No depletion  Scapular Bone Region No depletion  Dorsal Hand No depletion  Patellar Region No depletion  Anterior Thigh Region No depletion  Posterior Calf Region No depletion  Edema (RD Assessment) None  Hair Reviewed  Eyes Reviewed  Mouth Reviewed  Skin Reviewed  Nails Reviewed       Diet Order:   Diet Order             Diet NPO time specified Except for: Sips with Meds, Ice Chips  Diet effective midnight  EDUCATION NEEDS:   Education needs have been addressed  Skin:  Skin Assessment: Reviewed RN Assessment  Last BM:  01/18/24  Height:   Ht Readings from Last 1 Encounters:  12/01/23 5\' 7"  (1.702 m)    Weight:   Wt Readings from Last 1 Encounters:  01/18/24 86.1 kg    Ideal Body Weight:  67.3 kg  BMI:  Body mass index is 29.73 kg/m.  Estimated Nutritional Needs:   Kcal:  1700-1900  Protein:  90-105 grams  Fluid:  1.7-1.9 L    Herschel Lords, RD, LDN, CDCES Registered Dietitian III Certified Diabetes Care and Education  Specialist If unable to reach this RD, please use "RD Inpatient" group chat on secure chat between hours of 8am-4 pm daily

## 2024-01-19 NOTE — Procedures (Signed)
 PROCEDURE SUMMARY:  Successful US  guided left thoracentesis. Yielded 2.2L of bloody fluid. Patient tolerated procedure well. No immediate complications. EBL = trace  Specimen sent for labs.  Post procedure chest X-ray reveals no pneumothorax  Rozanne Heumann M Taiyana Kissler PA-C 01/19/2024 12:12 PM

## 2024-01-19 NOTE — Progress Notes (Signed)
 I had a discussion with the patient's medical teams.  He appears to have a significant pleural effusion.  Recommendation was for chest tube placement and follow-up patient response to this prior to undergoing anesthesia.  Given this, recommendations were to tentatively plan surgery tomorrow, 01/20/2024.  I discussed this as well as treatment of the patient's hip fracture with the patient's daughter over the phone today.  She is in agreement.

## 2024-01-19 NOTE — Consult Note (Signed)
 Cardiology Consultation   Patient ID: AARYAV HOPFENSPERGER MRN: [id number]; DOB: 03/19/1935  Admit date: 01/18/2024 Date of Consult: 01/19/2024  PCP:  Cole Daubs, [facility]   [facility] Providers Cardiologist: New - Dr. Alvenia Aus      Patient Profile: Alex Smith is a 89 y.o. male with a hx of hypertension, hyperlipidemia, diastolic dysfunction, GERD, dementia, BPH, CKD III, pancytopenia, smoldering myeloma, hairy cell leukemia in remission, myelodysplastic syndrome, and recurrent falls who is being seen 01/19/2024 for preoperative cardiac risk assessment at the request of Dr. Rosalea Collin.  History of Present Illness: [name] has a history of diastolic dysfunction noted on echo in 2018 which showed EF 65-70% with G1DD. This is managed by his PCP with torsemide  20 mg daily. He has not seen a cardiologist in the past. Patient was recently admitted 4/13-4/22 after a mechanical fall causing left rib fractures and extrapleural hematoma. Hospitalization was complicated by AKI on CKD. He was seen by nephrology and torsemide  and losartan  were held.   Patient does have history of dementia but answers questions appropriately and his daughter assists with history. He lives in an assisted living facility and is fairly active at baseline. He reports walking for exercise and will go back and forth down his hallway 5 times without shortness of breath and chest pain. He does not recall the exact events leading up to his fall. He suspects that he lost his balance which caused the fall onto his left hip. He denies any lightheadedness, dizziness, palpitations, and chest pain prior to falling. He does endorse pain in his left hip and lower back. He otherwise denies chest pain, shortness of breath, palpitations, orthopnea, and PND.   In the ED, BP 160/108 with otherwise normal vital signs.  Labs notable for WBC 3.9, RBC 2.68, hemoglobin 9.5, hematocrit 29.9 with 82, creatinine 1.26, CO2 20. BNP 66.5. EKG without  acute ischemic changes.  Chest x-ray showed large left pleural effusion.  Patient became hypoxic and required 4 L supplemental oxygen.  X-ray of the left hip/pelvis showed findings suspicious for nondisplaced left femoral intertrochanteric fracture.  CT left hip showed comminuted fractures of the left femoral neck extending to the lesser trochanter.  Mild varus angulation.  Orthopedic surgery was consulted.  Cardiology was asked to consult for preoperative cardiac risk assessment.  Past Medical History:  Diagnosis Date   Actinic keratosis    Alzheimer's dementia (HCC) 08/18/2021   Anemia    B12 deficiency    Basal cell carcinoma 03/30/2018   left ant nasal ala   Basal cell carcinoma 06/25/2023   R cheek, schedule for [facility]   Basal cell carcinoma of face 01/19/2013   right distal dorsum nose   BPH (benign prostatic hypertrophy)    followed by urology, discharged (Dr. Cherylene Corrente)   CAP (community acquired pneumonia) 02/15/2015   CKD stage 3 due to type 2 diabetes mellitus (HCC) 11/14/2017   Colon polyps    Depression with anxiety 01/19/2024   Diverticulosis    Dysplastic nevus 10/27/2006   right med calf   GERD (gastroesophageal reflux disease)    Hairy cell leukemia (HCC) 2006   recurrent, seizure on rituxan, now on cladribine  Alex Smith)   History of pneumonia 2000's   "once" (07/07/2012)   History of shingles    HLD (hyperlipidemia)    Hypertension    Pneumonia    Shortness of breath dyspnea    Squamous cell carcinoma in situ 07/05/2020   Left lat. pretibial. [facility] 09/07/2020  Squamous cell carcinoma in situ 07/05/2020   Right medial pretibial. [facility] 09/07/2020   Squamous cell carcinoma of skin 04/03/2020   Left cheek. [facility]   Systolic murmur 05/28/2016   Type 2 diabetes, controlled, with retinopathy (HCC)     Past Surgical History:  Procedure Laterality Date   25 GAUGE PARS PLANA VITRECTOMY WITH 20 GAUGE MVR PORT FOR MACULAR HOLE  07/07/2012   Procedure: 25 GAUGE PARS PLANA  VITRECTOMY WITH 20 GAUGE MVR PORT FOR MACULAR HOLE;  Surgeon: Rexene Catching, MD;  Location: [facility] OR;  Service: Ophthalmology;  Laterality: Left;   BONE MARROW BIOPSY  2016   CARDIOVASCULAR STRESS TEST  2013   treadmill - no evidence ischemia, EF 61%   CATARACT EXTRACTION W/ INTRAOCULAR LENS  IMPLANT, BILATERAL  ~ 2010   COLONOSCOPY  2014   Elliot WNL no rpt needed, h/o polyps   EYE SURGERY Left 06/2012   laser surgery   GAS INSERTION  07/07/2012   Procedure: INSERTION OF GAS;  Surgeon: Rexene Catching, MD;  Location: [facility] OR;  Service: Ophthalmology;  Laterality: Left;  [id number]   PERIPHERAL VASCULAR CATHETERIZATION N/A 02/23/2015   Procedure: [place] Stade Cath Insertion;  Surgeon: Celso College, MD;  Location: [facility] INVASIVE CV LAB;  Service: Cardiovascular;  Laterality: N/A;   PORTA CATH REMOVAL N/A 02/16/2020   Procedure: PORTA CATH REMOVAL;  Surgeon: Celso College, MD;  Location: [facility] INVASIVE CV LAB;  Service: Cardiovascular;  Laterality: N/A;   SERUM PATCH  07/07/2012   Procedure: SERUM PATCH;  Surgeon: Rexene Catching, MD;  Location: [facility] OR;  Service: Ophthalmology;  Laterality: Left;   SKIN CANCER EXCISION     "all over my face" (07/07/2012)       Scheduled Meds:  amLODipine   10 mg Oral Daily   ascorbic acid  500 mg Oral Daily   cyanocobalamin   1,000 mcg Oral Daily   insulin  aspart  0-5 Units Subcutaneous QHS   insulin  aspart  0-9 Units Subcutaneous TID WC   insulin  glargine-yfgn  6 Units Subcutaneous QHS   lidocaine   1 patch Transdermal QHS   multivitamin with minerals  1 tablet Oral Daily   nystatin  cream  1 Application Topical BID   pantoprazole   40 mg Oral BID   pravastatin   20 mg Oral QHS   sertraline   25 mg Oral Daily   tamsulosin   0.4 mg Oral QHS   torsemide   20 mg Oral Daily   triamcinolone cream  1 Application Topical BID   Continuous Infusions:   ceFAZolin  (ANCEF ) IV     tranexamic acid     PRN Meds: acetaminophen , albuterol , alum & mag hydroxide-simeth,  dextromethorphan-guaiFENesin , hydrALAZINE , LORazepam , methocarbamol , morphine  injection, ondansetron  (ZOFRAN ) IV, oxyCODONE -acetaminophen , senna-docusate  Allergies:    Allergies  Allergen Reactions   Rituxan [Rituximab] Rash and Other (See Comments)    Chest tightness    Blood-Group Specific Substance Other (See Comments)    Had a post transfusion reaction of red blood cells; NOW REQUIRES WASHED BLOOD CELLS   Primaxin Iv [Imipenem-Cilastatin] Other (See Comments)    Unknown reaction   Primaxin [Imipenem] Other (See Comments)    Unknown reaction Tolerates cephalosporins   Vfend [Voriconazole] Other (See Comments)    Unknown reaction   Sulfa Antibiotics Itching and Rash   Sulfacetamide Sodium Itching and Rash    Social History:   Social History   Socioeconomic History   Marital status: Married    Spouse name: Not on file  Number of children: Not on file   Years of education: Not on file   Highest education level: Not on file  Occupational History   Not on file  Tobacco Use   Smoking status: Former    Current packs/day: 0.00    Average packs/day: 2.0 packs/day for 25.0 years (50.0 ttl pk-yrs)    Types: Cigarettes    Start date: 02/07/1944    Quit date: 02/06/1969    Years since quitting: 54.9   Smokeless tobacco: Never   Tobacco comments:    07/07/2012 "stopped smoking ~ 40 yr ago; smoked 20-68yr"  Vaping Use   Vaping status: Never Used  Substance and Sexual Activity   Alcohol use: Not Currently   Drug use: No   Sexual activity: Never  Other Topics Concern   Not on file  Social History Narrative   Lives at home alone. 1 cat   Occupation: was Tree surgeon, works part time for [employer]   Activity: likes to golf   Diet: moderate water, fruits/vegetables daily   Social Drivers of Corporate investment banker Strain: Low Risk  (11/17/2023)   Received from [facility]   Overall Financial Resource Strain (CARDIA)    Difficulty of Paying Living Expenses: Not  very hard  Food Insecurity: No Food Insecurity (11/30/2023)   Hunger Vital Sign    Worried About Running Out of Food in the Last Year: Never true    Ran Out of Food in the Last Year: Never true  Transportation Needs: No Transportation Needs (11/30/2023)   PRAPARE - Administrator, Civil Service (Medical): No    Lack of Transportation (Non-Medical): No  Physical Activity: Not on file  Stress: Not on file  Social Connections: Socially Integrated (11/30/2023)   Social Connection and Isolation Panel [NHANES]    Frequency of Communication with Friends and Family: Three times a week    Frequency of Social Gatherings with Friends and Family: Three times a week    Attends Religious Services: 1 to 4 times per year    Active Member of Clubs or Organizations: Patient unable to answer    Attends Club or Organization Meetings: 1 to 4 times per year    Marital Status: Married  Catering manager Violence: Not At Risk (11/30/2023)   Humiliation, Afraid, Rape, and Kick questionnaire    Fear of Current or Ex-Partner: No    Emotionally Abused: No    Physically Abused: No    Sexually Abused: No    Family History:    Family History  Problem Relation Age of Onset   Dementia Mother    Heart failure Father 39   Cancer Sister        breast   Diabetes Paternal Uncle    Diabetes Paternal Aunt    CAD Brother 47       MI   Stroke Neg Hx      ROS:  Please see the history of present illness.     Physical Exam/Data: Vitals:   01/19/24 0145 01/19/24 0432 01/19/24 0715 01/19/24 0805  BP:  (!) 152/92  137/64  Pulse:  96  95  Resp:  20  16  Temp:  98.9 F (37.2 C)  98.9 F (37.2 C)  TempSrc:      SpO2: 96% 96% 98% 98%  Weight:        Intake/Output Summary (Last 24 hours) at 01/19/2024 0912 Last data filed at 01/19/2024 0600 Gross per 24 hour  Intake  100 ml  Output 350 ml  Net -250 ml      01/18/2024   11:45 PM 11/30/2023   10:26 AM 11/05/2023   10:35 AM  Last 3 Weights  Weight (lbs)  189 lb 13.1 oz 195 lb 196 lb  Weight (kg) 86.1 kg 88.451 kg 88.905 kg     Body mass index is 29.73 kg/m.  General:  Well nourished, well developed, in no acute distress HEENT: normal Neck: no JVD Vascular: No carotid bruits; Distal pulses 2+ bilaterally Cardiac:  normal S1, S2; RRR; no murmur  Lungs:  clear to auscultation bilaterally, no wheezing, rhonchi or rales  Abd: soft, nontender, no hepatomegaly  Ext: no edema Skin: warm and dry  Psych:  Normal affect   EKG:  The EKG was personally reviewed and demonstrates:  sinus rhythm with sinus pause, no acute ST segment abnormalities Telemetry:  Telemetry was personally reviewed and demonstrates:  sinus rhythm with first degree AV block and intermittent second segree type 1 AV block. One run of NSVT 15 beats.   Relevant CV Studies:  Echo ordered  Laboratory Data: High Sensitivity Troponin:  No results for input(s): "TROPONINIHS" in the last 720 hours.   Chemistry Recent Labs  Lab 01/18/24 2053 01/19/24 0249  NA 135 137  K 4.3 4.1  CL 109 111  CO2 20* 21*  GLUCOSE 171* 102*  BUN 20 19  CREATININE 1.26* 1.09  CALCIUM  8.3* 8.1*  MG  --  2.1  GFRNONAA 55* >60  ANIONGAP 6 5    No results for input(s): "PROT", "ALBUMIN", "AST", "ALT", "ALKPHOS", "BILITOT" in the last 168 hours. Lipids No results for input(s): "CHOL", "TRIG", "HDL", "LABVLDL", "LDLCALC", "CHOLHDL" in the last 168 hours.  Hematology Recent Labs  Lab 01/18/24 2053 01/19/24 0249  WBC 3.9* 3.7*  RBC 2.68* 2.45*  HGB 9.5* 8.7*  HCT 29.9* 26.9*  MCV 111.6* 109.8*  MCH 35.4* 35.5*  MCHC 31.8 32.3  RDW 17.4* 17.1*  PLT 82* 65*   Thyroid  No results for input(s): "TSH", "FREET4" in the last 168 hours.  BNP Recent Labs  Lab 01/18/24 2053  BNP 66.5    DDimer No results for input(s): "DDIMER" in the last 168 hours.  Radiology/Studies:  CT HEAD WO CONTRAST ( ) Result Date: 01/19/2024 IMPRESSION: 1. No acute intracranial abnormality. 2. Generalized  age-related cerebral atrophy with moderate chronic small vessel ischemic disease. Electronically Signed   By: Virgia Griffins M.D.   On: 01/19/2024 02:18   CT CHEST WO CONTRAST Result Date: 01/19/2024 IMPRESSION: 1. Large left pleural effusion with collapse and consolidation of most of the left lung, demonstrating significant progression since prior study. Mild atelectasis in the right lung base. 2. Small pericardial effusion. 3. Multiple left rib fractures with displacement and some. No change since prior study. 4. Cholelithiasis. Electronically Signed   By: Boyce Byes M.D.   On: 01/19/2024 01:48   DG Chest Portable 1 View Result Date: 01/18/2024 IMPRESSION: Large left pleural effusion with likely left lung atelectasis/consolidation causing diffuse opacification of the left hemithorax and progressing since prior study. Acute appearing left rib fractures are unchanged since prior study. Electronically Signed   By: Boyce Byes M.D.   On: 01/18/2024 22:54   CT Hip Left Wo Contrast Result Date: 01/18/2024 IMPRESSION: 1. Comminuted fractures of the left femoral neck extending to the lesser trochanter. Mild varus angulation. Electronically Signed   By: Boyce Byes M.D.   On: 01/18/2024 22:41   DG Hip Unilat W  or Wo Pelvis 2-3 Views Left Result Date: 01/18/2024 IMPRESSION: Findings suspicious for nondisplaced left femoral intertrochanteric fracture. Electronically Signed   By: Tyron Gallon M.D.   On: 01/18/2024 22:10    Assessment and Plan:  Preoperative cardiac risk assessment - Patient presenting after mechanical fall onto left hip. Found to have left hip fracture. Ortho consulted and recommended surgery. Patient is fairly functional at baseline, meeting 4 MET equivalent. Denies chest pain and shortness of breath. However, new O2 requirement of 4 L likely secondary to L pleural effusion. Also noted to have one run of NSVT on telemetry of 15 beats. Echo ordered, further recommendations  pending results. May need ischemic evaluation prior to proceeding with surgery.   L pleural effusion - S/p thoracentesis today yielding 2.2 L bloody fluid - Requiring 4 L supplemental O2  AV block - First degree AV block with intermittent second degree type 1 AV block - He is not on any AV nodal blocking agents - Continue to avoid AV nodal blocking agents and antiemetics - Consider ZIO monitor on discharge  NSVT - 15 beats of NSVT noted on telemetry - Echo ordered, further recommendations pending results as above  Diastolic dysfunction - Grade 1 diastolic dysfunction noted on echo in 2018 - Appears relatively euvolemic and well compensated on exam - Continue PTA torsemide  20 mg daily   For questions or updates, please contact [facility] Please consult [email or web address] for contact info under    Signed, Brodie Cannon, PA-C  01/19/2024 9:12 AM

## 2024-01-20 ENCOUNTER — Encounter: Payer: Self-pay | Admitting: Anesthesiology

## 2024-01-20 ENCOUNTER — Other Ambulatory Visit: Payer: Self-pay

## 2024-01-20 ENCOUNTER — Encounter
Admission: EM | Disposition: A | Discharge status: Other Acute Inpatient Hospital | Payer: Self-pay | Source: Skilled Nursing Facility | Attending: Internal Medicine

## 2024-01-20 ENCOUNTER — Ambulatory Visit: Admitting: Podiatry

## 2024-01-20 ENCOUNTER — Inpatient Hospital Stay (HOSPITAL_COMMUNITY)
Admit: 2024-01-20 | Discharge: 2024-01-20 | Disposition: A | Discharge status: Home / Self Care | Attending: Cardiovascular Disease | Admitting: Cardiovascular Disease

## 2024-01-20 DIAGNOSIS — S72002A Fracture of unspecified part of neck of left femur, initial encounter for closed fracture: Secondary | ICD-10-CM | POA: Diagnosis not present

## 2024-01-20 DIAGNOSIS — J90 Pleural effusion, not elsewhere classified: Secondary | ICD-10-CM | POA: Diagnosis not present

## 2024-01-20 DIAGNOSIS — I5032 Chronic diastolic (congestive) heart failure: Secondary | ICD-10-CM | POA: Diagnosis not present

## 2024-01-20 DIAGNOSIS — R0609 Other forms of dyspnea: Secondary | ICD-10-CM | POA: Diagnosis not present

## 2024-01-20 LAB — CBC
HCT: 25.6 % — ABNORMAL LOW (ref 39.0–52.0)
Hemoglobin: 8 g/dL — ABNORMAL LOW (ref 13.0–17.0)
MCH: 34.6 pg — ABNORMAL HIGH (ref 26.0–34.0)
MCHC: 31.3 g/dL (ref 30.0–36.0)
MCV: 110.8 fL — ABNORMAL HIGH (ref 80.0–100.0)
Platelets: 54 10*3/uL — ABNORMAL LOW (ref 150–400)
RBC: 2.31 MIL/uL — ABNORMAL LOW (ref 4.22–5.81)
RDW: 17.1 % — ABNORMAL HIGH (ref 11.5–15.5)
WBC: 2.6 10*3/uL — ABNORMAL LOW (ref 4.0–10.5)
nRBC: 0 % (ref 0.0–0.2)

## 2024-01-20 LAB — ECHOCARDIOGRAM COMPLETE
AR max vel: 1.31 cm2
AV Area VTI: 1.52 cm2
AV Area mean vel: 1.32 cm2
AV Mean grad: 29 mmHg
AV Peak grad: 42.2 mmHg
Ao pk vel: 3.25 m/s
Area-P 1/2: 4.89 cm2
MV VTI: 2.86 cm2
S' Lateral: 2.3 cm
Weight: 3037.06 [oz_av]

## 2024-01-20 LAB — GLUCOSE, CAPILLARY
Glucose-Capillary: 129 mg/dL — ABNORMAL HIGH (ref 70–99)
Glucose-Capillary: 166 mg/dL — ABNORMAL HIGH (ref 70–99)
Glucose-Capillary: 194 mg/dL — ABNORMAL HIGH (ref 70–99)

## 2024-01-20 LAB — TYPE AND SCREEN
ABO/RH(D): A NEG
Antibody Screen: NEGATIVE

## 2024-01-20 LAB — BASIC METABOLIC PANEL WITH GFR
Anion gap: 6 (ref 5–15)
BUN: 19 mg/dL (ref 8–23)
CO2: 24 mmol/L (ref 22–32)
Calcium: 8 mg/dL — ABNORMAL LOW (ref 8.9–10.3)
Chloride: 110 mmol/L (ref 98–111)
Creatinine, Ser: 1.21 mg/dL (ref 0.61–1.24)
GFR, Estimated: 58 mL/min — ABNORMAL LOW (ref >60)
Glucose, Bld: 127 mg/dL — ABNORMAL HIGH (ref 70–99)
Potassium: 4.2 mmol/L (ref 3.5–5.1)
Sodium: 140 mmol/L (ref 135–145)

## 2024-01-20 LAB — PREPARE RBC (CROSSMATCH)

## 2024-01-20 SURGERY — FIXATION, FRACTURE, INTERTROCHANTERIC, WITH INTRAMEDULLARY ROD
Anesthesia: Choice | Laterality: Left

## 2024-01-20 MED ORDER — PROPOFOL 10 MG/ML IV BOLUS
INTRAVENOUS | Status: AC
  Filled 2024-01-20: qty 20

## 2024-01-20 MED ORDER — BUPIVACAINE LIPOSOME 1.3 % IJ SUSP
INTRAMUSCULAR | Status: AC
  Filled 2024-01-20: qty 20

## 2024-01-20 MED ORDER — TRANEXAMIC ACID-NACL 1000-0.7 MG/100ML-% IV SOLN
INTRAVENOUS | Status: AC
  Filled 2024-01-20: qty 100

## 2024-01-20 MED ORDER — LIDOCAINE HCL (PF) 2 % IJ SOLN
INTRAMUSCULAR | Status: AC
  Filled 2024-01-20: qty 5

## 2024-01-20 MED ORDER — FENTANYL CITRATE (PF) 100 MCG/2ML IJ SOLN
INTRAMUSCULAR | Status: AC
Start: 2024-01-20 — End: ?
  Filled 2024-01-20: qty 2

## 2024-01-20 MED ORDER — CEFAZOLIN SODIUM-DEXTROSE 2-4 GM/100ML-% IV SOLN
INTRAVENOUS | Status: AC
  Filled 2024-01-20: qty 100

## 2024-01-20 MED ORDER — BUPIVACAINE HCL (PF) 0.5 % IJ SOLN
INTRAMUSCULAR | Status: AC
  Filled 2024-01-20: qty 30

## 2024-01-20 MED ORDER — CEFAZOLIN SODIUM-DEXTROSE 2-4 GM/100ML-% IV SOLN: 2.0000 g | Freq: Once | INTRAVENOUS | Status: DC

## 2024-01-20 MED ORDER — ROCURONIUM BROMIDE 10 MG/ML (PF) SYRINGE
PREFILLED_SYRINGE | INTRAVENOUS | Status: AC
  Filled 2024-01-20: qty 10

## 2024-01-20 NOTE — Consult Note (Signed)
 ORTHOPAEDIC CONSULTATION  REQUESTING PHYSICIAN: Montey Apa, DO  Chief Complaint:   L hip pain  History of Present Illness: Alex Smith is a 89 y.o. male who had a fall on 01/18/2024.  The patient noted immediate hip pain and inability to ambulate.  The patient ambulates with a walker at baseline.  The patient lives at an assisted living facility. Pain is worse with any sort of movement.  Imaging in the emergency department show a basicervical femoral neck fracture with extension to the lesser trochanter.  Of note, he has a medical history significant for CHF, HTN, HLD, GERD, early dementia, BPH, CKD, pancytopenia, leukemia remission, myelodysplastic syndrome, and recent rib fractures suffered April 2025.  On admission, he was found to have large left pleural effusion.  He had increased O2 requirements requiring nasal cannula supplementation.  A thoracentesis was performed yesterday with improvement in clinical and imaging findings. He has baseline low levels of Hg/Plt due to above history.  Past Medical History:  Diagnosis Date   Actinic keratosis    Alzheimer's dementia (HCC) 08/18/2021   Anemia    B12 deficiency    Basal cell carcinoma 03/30/2018   left ant nasal ala   Basal cell carcinoma 06/25/2023   R cheek, schedule for [facility]   Basal cell carcinoma of face 01/19/2013   right distal dorsum nose   BPH (benign prostatic hypertrophy)    followed by urology, discharged (Dr. 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 pretibial. 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 EDC 09/07/2020   Squamous cell carcinoma of skin 04/03/2020   Left cheek. WD SCC   Systolic murmur 05/28/2016   Type 2 diabetes, controlled, with retinopathy (HCC)    Past Surgical History:  Procedure Laterality Date   25 GAUGE PARS PLANA VITRECTOMY WITH 20 GAUGE MVR PORT FOR MACULAR HOLE  07/07/2012   Procedure: 25 GAUGE PARS PLANA VITRECTOMY WITH 20 GAUGE MVR PORT FOR MACULAR HOLE;  Surgeon: Rexene Catching, MD;  Location: [facility] OR;  Service: Ophthalmology;  Laterality: Left;   BONE MARROW BIOPSY  2016   CARDIOVASCULAR STRESS TEST  2013   treadmill - no evidence ischemia, EF 61%   CATARACT EXTRACTION W/ INTRAOCULAR LENS  IMPLANT, BILATERAL  ~ 2010   COLONOSCOPY  2014   Elliot WNL no rpt needed, h/o polyps   EYE SURGERY Left 06/2012   laser surgery   GAS INSERTION  07/07/2012   Procedure: INSERTION OF GAS;  Surgeon: Rexene Catching, MD;  Location: [facility] OR;  Service: Ophthalmology;  Laterality: Left;  [id number]   PERIPHERAL VASCULAR CATHETERIZATION N/A 02/23/2015   Procedure: Melville Stade Cath Insertion;  Surgeon: Celso College, MD;  Location: [facility] INVASIVE CV LAB;  Service: Cardiovascular;  Laterality: N/A;   PORTA CATH REMOVAL N/A 02/16/2020  Procedure: PORTA CATH REMOVAL;  Surgeon: Celso College, MD;  Location: [facility] INVASIVE CV LAB;  Service: Cardiovascular;  Laterality: N/A;   SERUM PATCH  07/07/2012   Procedure: SERUM PATCH;  Surgeon: Rexene Catching, MD;  Location: [facility] OR;  Service: Ophthalmology;  Laterality: Left;   SKIN CANCER EXCISION     "all over my face" (07/07/2012)   Social History   Socioeconomic History   Marital status: Married    Spouse name: Not on file   Number of children: Not on file   Years of education: Not on file   Highest  education level: Not on file  Occupational History   Not on file  Tobacco Use   Smoking status: Former    Current packs/day: 0.00    Average packs/day: 2.0 packs/day for 25.0 years (50.0 ttl pk-yrs)    Types: Cigarettes    Start date: 02/07/1944    Quit date: 02/06/1969    Years since quitting: 54.9   Smokeless tobacco: Never   Tobacco comments:    07/07/2012 "stopped smoking ~ 40 yr ago; smoked 20-73yr"  Vaping Use   Vaping status: Never Used  Substance and Sexual Activity   Alcohol use: Not Currently   Drug use: No   Sexual activity: Never  Other Topics Concern   Not on file  Social History Narrative   Lives at home alone. 1 cat   Occupation: was Tree surgeon, works part time for [employer]   Activity: likes to golf   Diet: moderate water, fruits/vegetables daily   Social Drivers of Corporate investment banker Strain: Low Risk  (11/17/2023)   Received from [facility]   Overall Financial Resource Strain (CARDIA)    Difficulty of Paying Living Expenses: Not very hard  Food Insecurity: No Food Insecurity (01/19/2024)   Hunger Vital Sign    Worried About Running Out of Food in the Last Year: Never true    Ran Out of Food in the Last Year: Never true  Transportation Needs: No Transportation Needs (01/19/2024)   PRAPARE - Administrator, Civil Service (Medical): No    Lack of Transportation (Non-Medical): No  Physical Activity: Not on file  Stress: Not on file  Social Connections: Socially Integrated (01/19/2024)   Social Connection and Isolation Panel [NHANES]    Frequency of Communication with Friends and Family: Three times a week    Frequency of Social Gatherings with Friends and Family: Three times a week    Attends Religious Services: 1 to 4 times per year    Active Member of Clubs or Organizations: Patient unable to answer    Attends Banker Meetings: 1 to 4 times per year    Marital Status: Married   Family History  Problem Relation  Age of Onset   Dementia Mother    Heart failure Father 75   Cancer Sister        breast   Diabetes Paternal Uncle    Diabetes Paternal Aunt    CAD Brother 93       MI   Stroke Neg Hx    Allergies  Allergen Reactions   Rituxan [Rituximab] Rash and Other (See Comments)    Chest tightness    Blood-Group Specific Substance Other (See Comments)    Had a post transfusion reaction of red blood cells; NOW REQUIRES WASHED BLOOD CELLS   Primaxin Iv [Imipenem-Cilastatin] Other (See Comments)    Unknown reaction   Primaxin [Imipenem] Other (  See Comments)    Unknown reaction Tolerates cephalosporins   Vfend [Voriconazole] Other (See Comments)    Unknown reaction   Sulfa Antibiotics Itching and Rash   Sulfacetamide Sodium Itching and Rash   Prior to Admission medications   Medication Sig Start Date End Date Taking? Authorizing Provider  acetaminophen  (TYLENOL ) 500 MG tablet Take 2 tablets (1,000 mg total) by mouth 2 (two) times daily. 11/27/22  Yes Wieting, Richard, MD  albuterol  (VENTOLIN  HFA) 108 (90 Base) MCG/ACT inhaler Inhale 2 puffs into the lungs every 6 (six) hours as needed for wheezing or shortness of breath. 04/20/22  Yes Garnet Just, MD  alum & mag hydroxide-simeth (MAALOX/MYLANTA) 200-200-20 MG/5ML suspension Take 30 mLs by mouth every 4 (four) hours as needed for indigestion or heartburn. Patient taking differently: Take 30 mLs by mouth 4 (four) times daily as needed for indigestion or heartburn. 04/20/22  Yes Garnet Just, MD  amLODipine  (NORVASC ) 10 MG tablet Take 1 tablet (10 mg total) by mouth daily. 04/20/22  Yes Garnet Just, MD  ascorbic acid (VITAMIN C) 500 MG tablet Take 500 mg by mouth daily.   Yes [provider]  aspirin  EC 81 MG tablet Take 81 mg by mouth daily.   Yes [provider]  donepezil  (ARICEPT ) 5 MG tablet Take 5 mg by mouth daily.   Yes [provider]  guaiFENesin  (GERI-TUSSIN) 100 MG/5ML liquid Take 15 mLs by mouth every 6 (six)  hours as needed for cough.   Yes [provider]  insulin  glargine (LANTUS ) 100 UNIT/ML Solostar Pen Inject 14 Units into the skin at bedtime. Patient taking differently: Inject 8 Units into the skin at bedtime. 09/18/23  Yes Lorita Rosa, MD  insulin  lispro (HUMALOG ) 100 UNIT/ML KwikPen Inject 2 Units into the skin 3 (three) times daily before meals. Per sliding scale Patient taking differently: Inject 7 Units into the skin 3 (three) times daily before meals. 11/27/22  Yes Wieting, Richard, MD  lidocaine -prilocaine  (EMLA ) cream Apply 1 Application topically as needed.   Yes [provider]  loperamide  (IMODIUM  A-D) 2 MG tablet Take 4 mg by mouth See admin instructions. Give 2 tablets (4mg ) by mouth after each loose stool as needed. Limit 8 tablets in 24 hours.   Yes [provider]  LORazepam  (ATIVAN ) 0.5 MG tablet Take 0.5 mg by mouth every 8 (eight) hours as needed for anxiety.   Yes [provider]  losartan  (COZAAR ) 25 MG tablet Take 1 tablet (25 mg total) by mouth daily. 11/27/22 01/24/24 Yes Wieting, Richard, MD  magnesium  hydroxide (MILK OF MAGNESIA) 400 MG/5ML suspension Take 30 mLs by mouth daily as needed (constipation).   Yes [provider]  Melatonin 10 MG TABS Take 10 mg by mouth at bedtime.   Yes [provider]  Multiple Vitamin (MULTIVITAMIN WITH MINERALS) TABS tablet Take 1 tablet by mouth daily. 08/23/21  Yes Lesa Rape, MD  naloxone (NARCAN) 0.4 MG/ML injection Inject 0.4 mg into the vein as needed.   Yes [provider]  nystatin  cream (MYCOSTATIN ) Apply 1 Application topically 2 (two) times daily. 01/13/24  Yes [provider]  oxyCODONE  (OXY IR/ROXICODONE ) 5 MG immediate release tablet Take 0.5-1 tablets (2.5-5 mg total) by mouth every 4 (four) hours as needed for moderate pain (pain score 4-6) or severe pain (pain score 7-10) (2.5 mg for moderate, 5 mg for severe). 12/09/23  Yes Maczis, Michael M, PA-C   pantoprazole  (PROTONIX ) 40 MG tablet Take 1 tablet (40 mg total)  by mouth 2 (two) times daily. 04/20/22  Yes Garnet Just, MD  pravastatin  (PRAVACHOL ) 20 MG tablet Take 20 mg by mouth at bedtime.   Yes [provider]  sertraline  (ZOLOFT ) 25 MG tablet Take 25 mg by mouth daily.   Yes [provider]  tamsulosin  (FLOMAX ) 0.4 MG CAPS capsule TAKE 1 CAPSULE BY MOUTH EVERY DAY Patient taking differently: Take 0.4 mg by mouth at bedtime. 12/11/17  Yes Claire Crick, MD  triamcinolone cream (KENALOG) 0.1 % Apply 1 Application topically 2 (two) times daily. Apply liberally to bilateral lower legs.   Yes [provider]  vitamin B-12 (CYANOCOBALAMIN ) 1000 MCG tablet Take 1 tablet (1,000 mcg total) by mouth daily. 06/01/19  Yes Claire Crick, MD  vitamin E  400 UNIT capsule Take 400 Units by mouth daily.   Yes [provider]  zinc sulfate, 50mg  elemental zinc, 220 (50 Zn) MG capsule Take 220 mg by mouth daily.   Yes [provider]  amoxicillin-clavulanate (AUGMENTIN) 875-125 MG tablet Take 1 tablet by mouth 2 (two) times daily. Patient not taking: Reported on 01/18/2024    [provider]  doxycycline (ADOXA) 100 MG tablet Take 100 mg by mouth 2 (two) times daily.    [provider]  famotidine  (PEPCID ) 20 MG tablet Take 20 mg by mouth daily.    [provider]  Luspatercept -aamt (REBLOZYL  ) Inject 100 mg into the skin See admin instructions. Infuse 100mg  into vein, every 3 weeks at oncology office.    [provider]  torsemide  (DEMADEX ) 20 MG tablet Take 20 mg by mouth daily.    [provider]  UNABLE TO FIND Apply 1 application  topically daily. Remove dressing and cleanse the foot, toes and wound with Vashe or saline or distilled water. Put Betadine on gauze and place on wound. Cover it with dry gauze then ABD. Place a dry 4x4 gauze on anterior ankle to protect it. Wrap with Kerlix then loose ACE. Secure with  tape. Do this daily for the left 2nd toe.    [provider]   Recent Labs    01/18/24 2053 01/19/24 0249 01/20/24 0259  WBC 3.9* 3.7* 2.6*  HGB 9.5* 8.7* 8.0*  HCT 29.9* 26.9* 25.6*  PLT 82* 65* 54*  K 4.3 4.1 4.2  CL 109 111 110  CO2 20* 21* 24  BUN 20 19 19   CREATININE 1.26* 1.09 1.21  GLUCOSE 171* 102* 127*  CALCIUM  8.3* 8.1* 8.0*  INR 1.0  --   --    DG Chest Port 1 View Result Date: 01/19/2024 CLINICAL DATA:  Pleural effusion EXAM: PORTABLE CHEST 1 VIEW COMPARISON:  January 18, 2024 FINDINGS: Comparison with prior examination there is marked improvement in the left pleural effusion post thoracentesis with a small residual left effusion in the left lower lobe atelectasis Again noted are multiple left posterior and lateral rib fractures without evidence of pneumothorax. The right lung is clear IMPRESSION: Marked improvement in the left pleural effusion post thoracentesis with a small residual left effusion in the left lower lobe atelectasis. Electronically Signed   By: Fredrich Jefferson M.D.   On: 01/19/2024 12:39   US  THORACENTESIS ASP PLEURAL SPACE W/IMG GUIDE Result Date: 01/19/2024 INDICATION: 89 year old male on aspirin  with a history of myelodysplastic syndrome recently admitted due to fall and left 3-9th rib fracture. CT chest with large left-sided pleural effusion. Request for diagnostic and therapeutic thoracentesis. EXAM: ULTRASOUND GUIDED left THORACENTESIS MEDICATIONS: 1% lidocaine , 6 mL. COMPLICATIONS: None  immediate. PROCEDURE: An ultrasound guided thoracentesis was thoroughly discussed with the patient and questions answered. The benefits, risks, alternatives and complications were also discussed. The patient understands and wishes to proceed with the procedure. Written consent was obtained. Ultrasound was performed to localize and mark an adequate pocket of fluid in the left chest. The area was then prepped and draped in the normal sterile fashion. 1% Lidocaine  was used for  local anesthesia. Under ultrasound guidance a 6 Fr Safe-T-Centesis catheter was introduced. Thoracentesis was performed. The catheter was removed and a dressing applied. FINDINGS: A total of approximately 2.2 L of bloody fluid was removed. Samples were sent to the laboratory as requested by the clinical team. IMPRESSION: Successful ultrasound guided left thoracentesis yielding 2.2 L of pleural fluid. Procedure performed by: Estella Helling, PA-C under the supervision of Dr. Sylvester Evert. Electronically Signed   By: Melven Stable.  Shick M.D.   On: 01/19/2024 12:13   CT HEAD WO CONTRAST ( ) Result Date: 01/19/2024 CLINICAL DATA:  Initial evaluation for acute head trauma. EXAM: CT HEAD WITHOUT CONTRAST TECHNIQUE: Contiguous axial images were obtained from the base of the skull through the vertex without intravenous contrast. RADIATION DOSE REDUCTION: This exam was performed according to the departmental dose-optimization program which includes automated exposure control, adjustment of the mA and/or kV according to patient size and/or use of iterative reconstruction technique. COMPARISON:  Prior study from 08/25/2022 FINDINGS: Brain: Examination degraded by motion. Generalized age-related cerebral atrophy. Patchy hypodensity involving the supratentorial cerebral white matter, consistent with chronic small vessel ischemic disease, moderate in nature. No acute intracranial hemorrhage. No acute large vessel territory infarct. No mass lesion, midline shift or mass effect. No hydrocephalus or extra-axial fluid collection. Vascular: No abnormal hyperdense vessel. Calcified atherosclerosis present at the skull base. Skull: Scalp soft tissues demonstrate no acute finding. Calvarium intact. Sinuses/Orbits: Globes orbital soft tissues demonstrate no acute finding. Mild mucosal thickening noted about the right ethmoidal air cells. Paranasal sinuses are otherwise largely clear. Trace right mastoid effusion noted, of doubtful significance.  Other: None. IMPRESSION: 1. No acute intracranial abnormality. 2. Generalized age-related cerebral atrophy with moderate chronic small vessel ischemic disease. Electronically Signed   By: Virgia Griffins M.D.   On: 01/19/2024 02:18   CT CHEST WO CONTRAST Result Date: 01/19/2024 CLINICAL DATA:  Blunt chest trauma. EXAM: CT CHEST WITHOUT CONTRAST TECHNIQUE: Multidetector CT imaging of the chest was performed following the standard protocol without IV contrast. RADIATION DOSE REDUCTION: This exam was performed according to the departmental dose-optimization program which includes automated exposure control, adjustment of the mA and/or kV according to patient size and/or use of iterative reconstruction technique. COMPARISON:  Chest radiograph 01/18/2024.  CT chest 11/30/2023 FINDINGS: Cardiovascular: Normal heart size. Small pericardial effusion. Normal caliber thoracic aorta. Calcification of the aorta and coronary arteries. Mediastinum/Nodes: Thyroid  gland is unremarkable. No significant lymphadenopathy. Calcified lymph nodes are present in the right hilum consistent with post inflammatory granulomas. Esophagus is decompressed. Lungs/Pleura: Large left pleural effusion demonstrating significant progression since previous study. Collapse and consolidation of the left lung with some residual aeration of the left upper lung. No pneumothorax. Mild atelectasis in the right lung base. Upper Abdomen: Calcified granulomas in the spleen. Cholelithiasis without inflammatory change in the gallbladder. Musculoskeletal: Displaced fractures of multiple left lateral ribs, not significantly changed since prior study. Visualized spine and sternum appear intact. Degenerative changes in the spine. IMPRESSION: 1. Large left pleural effusion with collapse and consolidation of most of the left lung, demonstrating significant progression since  prior study. Mild atelectasis in the right lung base. 2. Small pericardial effusion. 3.  Multiple left rib fractures with displacement and some. No change since prior study. 4. Cholelithiasis. Electronically Signed   By: Boyce Byes M.D.   On: 01/19/2024 01:48   DG Chest Portable 1 View Result Date: 01/18/2024 CLINICAL DATA:  Preoperative study.  Trip and fall injury. EXAM: PORTABLE CHEST 1 VIEW COMPARISON:  12/01/2023 FINDINGS: There is diffuse opacification of the left hemithorax with some aeration of the left upper lung. This is likely a large left pleural effusion with atelectasis or consolidation in the left lower lung. This is progressing since the prior study. Right lung is clear. No pneumothorax. Heart size is obscured by the left chest process. Multiple displaced left rib fractures are similar to prior study. IMPRESSION: Large left pleural effusion with likely left lung atelectasis/consolidation causing diffuse opacification of the left hemithorax and progressing since prior study. Acute appearing left rib fractures are unchanged since prior study. Electronically Signed   By: Boyce Byes M.D.   On: 01/18/2024 22:54   CT Hip Left Wo Contrast Result Date: 01/18/2024 CLINICAL DATA:  Left hip fracture. Trip and fall injury to the left hip. EXAM: CT OF THE LEFT HIP WITHOUT CONTRAST TECHNIQUE: Multidetector CT imaging of the left hip was performed according to the standard protocol. Multiplanar CT image reconstructions were also generated. RADIATION DOSE REDUCTION: This exam was performed according to the departmental dose-optimization program which includes automated exposure control, adjustment of the mA and/or kV according to patient size and/or use of iterative reconstruction technique. COMPARISON:  Left hip radiograph 01/18/2024 FINDINGS: Bones/Joint/Cartilage Comminuted fractures of the left femoral neck involving the subcapital region and extending into the inter trochanteric region at the level of the lesser trochanter. Mild varus angulation. Visualized portion of the acetabulum  and left hemipelvis appears intact. Ligaments Suboptimally assessed by CT. Muscles and Tendons No intramuscular hematoma. Soft tissues Visualized pelvic organs appear intact. Mild soft tissue stranding in the subcutaneous fat over the left hip consistent with contusion. No loculated collections. Vascular calcifications are present. IMPRESSION: 1. Comminuted fractures of the left femoral neck extending to the lesser trochanter. Mild varus angulation. Electronically Signed   By: Boyce Byes M.D.   On: 01/18/2024 22:41   DG Hip Unilat W or Wo Pelvis 2-3 Views Left Result Date: 01/18/2024 CLINICAL DATA:  Fall and pain EXAM: DG HIP (WITH OR WITHOUT PELVIS) 2-3V LEFT COMPARISON:  Left hip x-ray 08/09/2021 FINDINGS: The bones are osteopenic. There are findings suspicious for nondisplaced left femoral inter trochanteric fracture. There is no dislocation. There are mild degenerative changes of both hips. Soft tissues are within normal limits. IMPRESSION: Findings suspicious for nondisplaced left femoral intertrochanteric fracture. Electronically Signed   By: Tyron Gallon M.D.   On: 01/18/2024 22:10     Positive ROS: All other systems have been reviewed and were otherwise negative with the exception of those mentioned in the HPI and as above.  Physical Exam: BP (!) 154/67 (BP Location: Right Arm)   Pulse 89   Temp 97.6 F (36.4 C) (Oral)   Resp 17   Wt 86.1 kg   SpO2 97%   BMI 29.73 kg/m  General:  Alert, no acute distress Psychiatric:  Patient is competent for consent with normal mood and affect    Orthopedic Exam:  LLE: + DF/PF/EHL SILT grossly over foot Foot wwp +Log roll/axial load   Imaging:  basicervical femoral neck fracture with extension to the  lesser trochanter  Assessment/Plan: Alex Smith is a 89 y.o. male with a L basicervical femoral neck fracture with extension to the lesser trochanter  1. I discussed the various treatment options including both surgical and  non-surgical management of the fracture with the patient and family (medical PoA). We discussed the high risk of perioperative complications due to patient's age, dementia, and other co-morbidities. After discussion of risks, benefits, and alternatives to surgery, the family and patient were in agreement to proceed with surgery. The goals of surgery would be to provide adequate pain relief and allow for mobilization. Plan for surgery is L hip cephalomedullary nailing today, 01/20/2024. 2. NPO until OR 3. Hold anticoagulation in advance of OR 4. Patient cleared by primary team and Cardiology team to proceed with surgery.  Lorri Rota   01/20/2024 7:40 AM

## 2024-01-20 NOTE — Progress Notes (Signed)
 Progress Note   Patient: Alex Smith WUJ:[id number] DOB: April 01, 1935 DOA: 01/18/2024     2 DOS: the patient was seen and examined on 01/20/2024   Brief hospital course:  "ANEESH FALLER is a 89 y.o. male with medical history significant of dCHF, HTN, HLD, dCHF, GERD, dementia, BPH, CKD-3b, depression pancytopenia, smoldering myeloma,  hairy cell leukemia in remission, myelodysplastic syndrome, low grade, multiple myeloma, myelodysplastic syndrome, low grader, on immunotherapy, recent admission due to fall and left 3-9th rib fracture in April (4/13-4/22), who presents with fall and left hip pain. ..." See H&P for full HPI on admission & ED course.  Patient was admitted for further evaluation and management of  --Left hip fracture --Large left pleural effusion complicated by acute respiratory failure with hypoxia  Pulmonology was consulted for the pleural effusion and Cardiology for perioperative risk assessment.  Further hospital course and management as outlined below.    Assessment and Plan:  Closed left hip fracture [facility]): CT scan showed comminuted fractures of the left femoral neck extending to the lesser trochanter and mild varus angulation. Patient has moderate pain now. No neurovascular compromise. Orthopedic surgeon, Dr. Lydia Sams was consulted.  Given patient's old age, history of dHF, 4L of new oxygen requirement, and multiple comorbidities, will consult [facility] cardiology for presurgical clearance.  Epic message sent to [facility] consult pool for [facility] cardiology consult. --Ortho is consulted --Surgery delayed for today for thoracentesis and cardiac pre-op evaluation --Going for surgery this afternoon --NPO until post-op --Pain control: prn morphine , percocet and tylenol  --PRN Zofran  for nausea --Robaxin  for muscle spasm --Lidoderm  patch for pain    Fall at home [facility]), initial encounter CT head non-acute. --PT/OT post-op after hip repair --Fall precautions   Chronic diastolic  CHF (congestive heart failure) (HCC): 2D echo on 06/06/2017 showed EF of 65-70% with grade 1 diastolic dysfunction.  Patient has 1+ leg edema, but BNP is normal 6065, does not seem to have CHF exacerbation.  Patient has new 4ls oxygen requirement, which is likely due to large left pleural effusion. -will continue home torsimde --Cardiology consulted for perioperative risk assessment -cleared --Echo - report is pending --Monitor volume status   Oxygen desaturation due to large pleural effusion on left:  POA CXR showed large left pleural effusion with likely left lung atelectasis/consolidation causing diffuse opacification of the left hemithorax and progressing since prior study, acute appearing left rib fractures are unchanged since prior study.  --Pulmonology consulted --s/p Thoracentesis 6/2 with 2.2L fluid removed - follow pleural fluid studies, appears exudative --Pain control as above --Incentive spirometry --Bronchodilators and as needed Mucinex  --Nasal cannula oxygen to maintain oxygen saturation above 93%   Left rib fractures, multiple:  --Pain control as above --Incentive spirometry  Type II diabetes mellitus with renal manifestations (HCC): Recent A1c.  Patient is taking NovoLog  and Lantus  8 units daily --Sliding scale insulin  --Glargine insulin  60 units daily   BPH (benign prostatic hyperplasia) --Flomax    Essential hypertension --IV hydralazine  as needed --Amlodipine , torsemide    Dyslipidemia --Pravastatin    Alzheimer's dementia (HCC) --Fall precaution --Donepezil    Chronic kidney disease, stage 3b: Renal function stable.  Recent baseline creatinine 1.41 on/21/25.  His creatinine is 1.26, BUN 20, GFR 55. --Follow-up with BMP   Pancytopenia and multiple blood disorder including hairy cell leukemia, in remission, smoldering myeloma, myelodysplasia present in bone marrow, myelodysplastic syndrome, low grade, multiple myeloma not having achieved remission: pt is  currently on immunotherapy (Rebloxyl) q3week.  Hgb stable 9.5 >> 8.7 (compared  to 8.0 on 12/29/2023) --Follow-up with oncology, Dr. Randy Buttery   Depression with anxiety --Continue home Zoloft  and as needed Ativan          Subjective: Pt seen with daughter at bedside this AM. He reports feeling okay, hungry awaiting hip surgery later this afternoon.  Pt denies SOB, F/C.  Not in too much pain unless he tries to move.   Physical Exam: Vitals:   01/19/24 1959 01/20/24 0407 01/20/24 0746 01/20/24 0804  BP: 128/72 (!) 154/67 (!) 142/66 (!) 158/75  Pulse: 92 89 94   Resp: 18 17 18    Temp: 98.1 F (36.7 C) 97.6 F (36.4 C) 98.2 F (36.8 C)   TempSrc: Oral Oral Oral   SpO2: 97% 97% 91%   Weight:       General exam: awake, alert, no acute distress HEENT: moist mucus membranes, hearing grossly normal  Respiratory system: CTAB with significantly improved aeration on left, no wheezes or rhonchi, normal respiratory effort on 2.5 L/min [place] O2 Cardiovascular system: normal S1/S2, RRR, 2/6 systolic murmur, no pedal edema.   Gastrointestinal system: soft, NT, ND, no HSM felt, +bowel sounds. Central nervous system: A&O x 2+. no gross focal neurologic deficits, normal speech Extremities: no edema, normal tone Skin: dry, intact, normal temperature, no rashes seen on visualized skin Psychiatry: normal mood, congruent affect, judgement and insight appear normal   Data Reviewed:  Notable labs --  Glucose 127 Ca 8.0 WBC 2.6 Hbg 8.0 Platelets 54k    PENDING -- pleural fluid studies   Family Communication: daughter at bedside on rounds 6/2, 6/3  Disposition: Status is: Inpatient Remains inpatient appropriate because: ongoing evaluation, surgery for hip fracture pending today   Planned Discharge Destination: Skilled nursing facility    Time spent: 45 minutes  Author: Montey Apa, DO 01/20/2024 1:42 PM  For on call review [email or web address].

## 2024-01-20 NOTE — Progress Notes (Signed)
*  PRELIMINARY RESULTS* Echocardiogram 2D Echocardiogram has been performed.  Alex Smith 01/20/2024, 8:45 AM

## 2024-01-20 NOTE — Progress Notes (Signed)
*  PRELIMINARY RESULTS* Echocardiogram 2D Echocardiogram has been performed.  Broadus Canes 01/20/2024, 8:45 AM

## 2024-01-20 NOTE — Care Management Important Message (Signed)
 Important Message  Patient Details  Name: Alex Smith MRN: [id number] Date of Birth: [birth year]   Important Message Given:  Yes - Medicare IM     Karrine Kluttz W, CMA 01/20/2024, 10:41 AM

## 2024-01-20 NOTE — Progress Notes (Signed)
 I discussed this patient's case with our anesthesia team.  Patient's hemoglobin was 8.0 and platelets were 54.  Given the nature of the surgery, there is certainly the possibility that the patient may need either or both PRBCs and platelets.  The patient needs washed blood/platelets as he has had prior seizure type reactions when he has received unwashed transfusions.  According to our anesthesia team and the blood bank, washed platelets have to come from [place] and would expire by the time they are delivered here.  Washed blood could be obtained, but would take 3 hours. Given the inability to obtain appropriate platelets at our facility, the anesthesia team has recommended that the patient be transferred to a facility that can provide washed platelets in the event these are needed.  This was discussed with the patient and his family who are in understanding.  They would prefer to be transferred to [facility], but would be amenable to [facility] or any other facility that could help take care of the patient.  I have also communicated these findings to the patient's primary medical team.

## 2024-01-21 DIAGNOSIS — E1122 Type 2 diabetes mellitus with diabetic chronic kidney disease: Secondary | ICD-10-CM

## 2024-01-21 DIAGNOSIS — I5032 Chronic diastolic (congestive) heart failure: Secondary | ICD-10-CM | POA: Diagnosis not present

## 2024-01-21 DIAGNOSIS — Z794 Long term (current) use of insulin: Secondary | ICD-10-CM

## 2024-01-21 DIAGNOSIS — S2242XS Multiple fractures of ribs, left side, sequela: Secondary | ICD-10-CM

## 2024-01-21 DIAGNOSIS — S72002A Fracture of unspecified part of neck of left femur, initial encounter for closed fracture: Secondary | ICD-10-CM | POA: Insufficient documentation

## 2024-01-21 DIAGNOSIS — J9601 Acute respiratory failure with hypoxia: Secondary | ICD-10-CM

## 2024-01-21 DIAGNOSIS — S72002S Fracture of unspecified part of neck of left femur, sequela: Secondary | ICD-10-CM | POA: Diagnosis not present

## 2024-01-21 DIAGNOSIS — F418 Other specified anxiety disorders: Secondary | ICD-10-CM

## 2024-01-21 LAB — GLUCOSE, CAPILLARY
Glucose-Capillary: 135 mg/dL — ABNORMAL HIGH (ref 70–99)
Glucose-Capillary: 147 mg/dL — ABNORMAL HIGH (ref 70–99)
Glucose-Capillary: 199 mg/dL — ABNORMAL HIGH (ref 70–99)
Glucose-Capillary: 204 mg/dL — ABNORMAL HIGH (ref 70–99)
Glucose-Capillary: 181 mg/dL — ABNORMAL HIGH (ref 70–99)

## 2024-01-21 LAB — ACID FAST SMEAR (AFB, MYCOBACTERIA): Acid Fast Smear: NEGATIVE

## 2024-01-21 LAB — BASIC METABOLIC PANEL WITH GFR
Anion gap: 6 (ref 5–15)
BUN: 21 mg/dL (ref 8–23)
CO2: 23 mmol/L (ref 22–32)
Calcium: 8 mg/dL — ABNORMAL LOW (ref 8.9–10.3)
Chloride: 109 mmol/L (ref 98–111)
Creatinine, Ser: 1.15 mg/dL (ref 0.61–1.24)
GFR, Estimated: >60 mL/min (ref >60)
Glucose, Bld: 143 mg/dL — ABNORMAL HIGH (ref 70–99)
Potassium: 4.1 mmol/L (ref 3.5–5.1)
Sodium: 138 mmol/L (ref 135–145)

## 2024-01-21 LAB — CBC WITH DIFFERENTIAL/PLATELET
Abs Immature Granulocytes: 0.01 10*3/uL (ref 0.00–0.07)
Basophils Absolute: 0 10*3/uL (ref 0.0–0.1)
Basophils Relative: 0 %
Eosinophils Absolute: 0.1 10*3/uL (ref 0.0–0.5)
Eosinophils Relative: 3 %
HCT: 24.7 % — ABNORMAL LOW (ref 39.0–52.0)
Hemoglobin: 7.9 g/dL — ABNORMAL LOW (ref 13.0–17.0)
Immature Granulocytes: 0 %
Lymphocytes Relative: 30 %
Lymphs Abs: 0.9 10*3/uL (ref 0.7–4.0)
MCH: 35.3 pg — ABNORMAL HIGH (ref 26.0–34.0)
MCHC: 32 g/dL (ref 30.0–36.0)
MCV: 110.3 fL — ABNORMAL HIGH (ref 80.0–100.0)
Monocytes Absolute: 0.1 10*3/uL (ref 0.1–1.0)
Monocytes Relative: 5 %
Neutro Abs: 1.8 10*3/uL (ref 1.7–7.7)
Neutrophils Relative %: 62 %
Platelets: 55 10*3/uL — ABNORMAL LOW (ref 150–400)
RBC: 2.24 MIL/uL — ABNORMAL LOW (ref 4.22–5.81)
RDW: 16.8 % — ABNORMAL HIGH (ref 11.5–15.5)
WBC: 2.9 10*3/uL — ABNORMAL LOW (ref 4.0–10.5)
nRBC: 0 % (ref 0.0–0.2)

## 2024-01-21 LAB — CYTOLOGY - NON PAP

## 2024-01-21 MED ORDER — ONDANSETRON HCL 4 MG/2ML IJ SOLN: 4.0000 mg | Freq: Three times a day (TID) | INTRAMUSCULAR | Status: AC | PRN

## 2024-01-21 MED ORDER — OXYCODONE-ACETAMINOPHEN 5-325 MG PO TABS: 1.0000 | ORAL_TABLET | ORAL | Status: AC | PRN

## 2024-01-21 MED ORDER — ACETAMINOPHEN 325 MG PO TABS: 650.0000 mg | ORAL_TABLET | Freq: Four times a day (QID) | ORAL | Status: AC | PRN

## 2024-01-21 MED ORDER — ENSURE PLUS HIGH PROTEIN PO LIQD: 237.0000 mL | Freq: Two times a day (BID) | ORAL | Status: AC

## 2024-01-21 MED ORDER — METHOCARBAMOL 500 MG PO TABS: 500.0000 mg | ORAL_TABLET | Freq: Three times a day (TID) | ORAL | Status: AC | PRN

## 2024-01-21 MED ORDER — MORPHINE SULFATE (PF) 2 MG/ML IV SOLN: 1.0000 mg | INTRAVENOUS | Status: AC | PRN

## 2024-01-21 MED ORDER — INSULIN GLARGINE 100 UNIT/ML SOLOSTAR PEN: 6.0000 [IU] | PEN_INJECTOR | Freq: Every day | SUBCUTANEOUS | Status: AC

## 2024-01-21 MED ORDER — DM-GUAIFENESIN ER 30-600 MG PO TB12: 1.0000 | ORAL_TABLET | Freq: Two times a day (BID) | ORAL | Status: AC | PRN

## 2024-01-21 MED ORDER — INSULIN LISPRO (1 UNIT DIAL) 100 UNIT/ML (KWIKPEN): 7.0000 [IU] | PEN_INJECTOR | Freq: Three times a day (TID) | SUBCUTANEOUS | Status: AC

## 2024-01-21 MED ORDER — TORSEMIDE 20 MG PO TABS: 20.0000 mg | ORAL_TABLET | Freq: Every day | ORAL | Status: AC

## 2024-01-21 NOTE — Plan of Care (Signed)
   Problem: Coping: Goal: Ability to adjust to condition or change in health will improve Outcome: Progressing

## 2024-01-21 NOTE — Assessment & Plan Note (Addendum)
 Continue to monitor blood counts.  Patient has pancytopenia with white blood cell count 2.9, hemoglobin 7.9 and platelet count of 55.

## 2024-01-21 NOTE — Assessment & Plan Note (Signed)
 Follow up as outpatient.

## 2024-01-21 NOTE — Assessment & Plan Note (Signed)
 No current signs of heart failure.  EF of 70%.  Continue torsemide .

## 2024-01-21 NOTE — Assessment & Plan Note (Signed)
 Last hemoglobin A1c 7.2.  On low-dose long-acting insulin  plus short acting insulin  prior to meals.

## 2024-01-21 NOTE — Discharge Summary (Addendum)
 Physician Discharge Summary   Patient: Alex Smith MRN: [id number] DOB: [birth year]  Admit date:     01/18/2024  Discharge date: 01/21/24  Discharge Physician: Verla Glaze   PCP: Cole Daubs, [facility]   Recommendations at discharge:   Team at [facility] care center when bed available  Discharge Diagnoses: Principal Problem:   Closed left hip fracture [facility]) Active Problems:   Acute hypoxic respiratory failure (HCC)   Fall at home, initial encounter   Chronic diastolic CHF (congestive heart failure) (HCC)   Pleural effusion on left   Left rib fracture   BPH (benign prostatic hyperplasia)   Essential hypertension   Dyslipidemia   Alzheimer's dementia (HCC)   Chronic kidney disease, stage 3b (HCC)   Hairy cell leukemia, in remission (HCC)   Smoldering myeloma   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 *  Hospital Course: 89 y.o. male with medical history significant of dCHF, HTN, HLD, dCHF, GERD, dementia, BPH, CKD-3b, depression pancytopenia, smoldering myeloma,  hairy cell leukemia in remission, myelodysplastic syndrome, low grade, multiple myeloma, myelodysplastic syndrome, low grader, on immunotherapy, recent admission due to fall and left 3-9th rib fracture in April (4/13-4/22), who presents with fall and left hip pain. ..." See H&P for full HPI on admission & ED course.   Patient was admitted for further evaluation and management of  --Left hip fracture --Large left pleural effusion complicated by acute respiratory failure with hypoxia   Pulmonology was consulted for the pleural effusion and Cardiology for perioperative risk assessment.  Further hospital course and management as outlined below.  6/2.   Interventional radiology drew off 2.2 L of bloody fluid with left thoracentesis.  Cultures negative for 2 days. 6/3.  Orthopedic surgery recommended transfer to a tertiary care center secondary to not having washed packed red blood cells and platelets close to this hospital. 6/4.  Patient accepted to [facility] when bed available.  Assessment and Plan: * Closed left hip fracture [facility] [address]) Orthopedic surgery recommended transfer to tertiary care hospital when bed available for surgery.  Patient seen by cardiology and can undergo procedure.  Echocardiogram showed an EF of 70 to 75%  Chronic diastolic CHF (congestive heart failure) (HCC) No current signs of heart failure.  EF of 70%.  Continue torsemide .  Acute hypoxic respiratory failure (HCC) Secondary to large pleural effusion.  Patient currently down to 2 L.  On presentation was on 4 L with a pulse ox of 91%.  Left rib fracture Pain control  Pleural effusion on left On 6/2 interventional radiology drew 2.2 L of bloody fluid from underneath the left lung.  Cultures so far negative.  BPH (benign prostatic hyperplasia) On Flomax   Essential hypertension Continue Norvasc  and torsemide   Chronic kidney disease, stage 3b (HCC) Last creatinine 1.15 with a GFR greater than 60  Alzheimer's dementia (HCC) Without behavioral disturbance.  Not on any medications.  Dyslipidemia Continue pravastatin   Pancytopenia (HCC) Continue to monitor counts.  Patient may end up needing a transfusion at some point.  Needs washed red blood cells and platelets.  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  Patient has pancytopenia with white blood cell count 2.9, hemoglobin 7.9 and platelet count of 55.  Smoldering myeloma Follow-up  as outpatient  Hairy cell leukemia, in remission (HCC) Follow-up as outpatient  Type II diabetes mellitus with renal manifestations (HCC) Last hemoglobin A1c 7.2.  On low-dose long-acting  insulin  plus short acting insulin  prior to meals.  Depression with anxiety On Zoloft          Consultants: Orthopedic surgery, cardiology, interventional radiology Procedures performed: Thoracentesis Disposition: Transfer to [facility] when bed available Diet recommendation:  Cardiac and Carb modified diet DISCHARGE MEDICATION: Allergies as of 01/21/2024       Reactions   Rituxan [rituximab] Rash, Other (See Comments)   Chest tightness   Blood-group Specific Substance Other (See Comments)   Had a post transfusion reaction of red blood cells; NOW REQUIRES WASHED BLOOD CELLS   Primaxin Iv [imipenem-cilastatin] Other (See Comments)   Unknown reaction   Primaxin [imipenem] Other (See Comments)   Unknown reaction Tolerates cephalosporins   Vfend [voriconazole] Other (See Comments)   Unknown reaction   Sulfa Antibiotics Itching, Rash   Sulfacetamide Sodium Itching, Rash        Medication List     STOP taking these medications    aspirin  EC 81 MG tablet   donepezil  5 MG tablet Commonly known as: ARICEPT    famotidine  20 MG tablet Commonly known as: PEPCID    Geri-Tussin 100 MG/5ML liquid Generic drug: guaiFENesin    lidocaine -prilocaine  cream Commonly known as: EMLA    loperamide  2 MG tablet Commonly known as: IMODIUM  A-D   losartan  25 MG tablet Commonly known as: Cozaar    magnesium  hydroxide 400 MG/5ML suspension Commonly known as: MILK OF MAGNESIA   oxyCODONE  5 MG immediate release tablet Commonly known as: Oxy IR/ROXICODONE    REBLOZYL  [place]   UNABLE TO FIND   vitamin E  180 MG (400 UNITS) capsule   zinc sulfate (50mg  elemental zinc) 220 (50 Zn) MG capsule       TAKE these medications    acetaminophen  325 MG tablet Commonly known as: TYLENOL  Take 2 tablets (650 mg total) by mouth every 6 (six) hours as needed for mild pain (pain score 1-3) or fever. What changed:  medication strength how much to take when to take this reasons to take this    albuterol  108 (90 Base) MCG/ACT inhaler Commonly known as: VENTOLIN  HFA Inhale 2 puffs into the lungs every 6 (six) hours as needed for wheezing or shortness of breath.   alum & mag hydroxide-simeth 200-200-20 MG/5ML suspension Commonly known as: MAALOX/MYLANTA Take 30 mLs by mouth every 4 (four) hours as needed for indigestion or heartburn. What changed: when to take this   amLODipine  10 MG tablet Commonly known as: NORVASC  Take 1 tablet (10 mg total) by mouth daily.   ascorbic acid 500 MG tablet Commonly known as: VITAMIN C Take 500 mg by mouth daily.   cyanocobalamin  1000 MCG tablet Commonly known as: VITAMIN B12 Take 1 tablet (1,000 mcg total) by mouth daily.   dextromethorphan-guaiFENesin  30-600 MG 12hr tablet Commonly known as: MUCINEX  DM Take 1 tablet by mouth 2 (two) times daily as needed for cough.   feeding supplement Liqd Take 237 mLs by mouth 2 (two) times daily between meals.   insulin  glargine 100 UNIT/ML Solostar Pen Commonly known as: LANTUS  Inject 6 Units into the skin at bedtime. What changed: how much to take   insulin  lispro 100 UNIT/ML KwikPen Commonly known as: HUMALOG  Inject 7 Units into the skin 3 (three) times daily before meals.   LORazepam  0.5 MG tablet Commonly known as: ATIVAN  Take 0.5 mg by mouth every  8 (eight) hours as needed for anxiety.   Melatonin 10 MG Tabs Take 10 mg by mouth at bedtime.   methocarbamol  500 MG tablet Commonly known as: ROBAXIN  Take 1 tablet (500 mg total) by mouth every 8 (eight) hours as needed for muscle spasms.   morphine  (PF) 2 MG/ML injection Inject 0.5 mLs (1 mg total) into the vein every 3 (three) hours as needed.   multivitamin with minerals Tabs tablet Take 1 tablet by mouth daily.   naloxone 0.4 MG/ML injection Commonly known as: NARCAN Inject 0.4 mg into the vein as needed.   nystatin  cream Commonly known as: MYCOSTATIN  Apply 1 Application topically 2 (two) times daily.   ondansetron  4  MG/2ML Soln injection Commonly known as: ZOFRAN  Inject 2 mLs (4 mg total) into the vein every 8 (eight) hours as needed for nausea or vomiting.   oxyCODONE -acetaminophen  5-325 MG tablet Commonly known as: PERCOCET/ROXICET Take 1 tablet by mouth every 4 (four) hours as needed for moderate pain (pain score 4-6).   pantoprazole  40 MG tablet Commonly known as: PROTONIX  Take 1 tablet (40 mg total) by mouth 2 (two) times daily.   pravastatin  20 MG tablet Commonly known as: PRAVACHOL  Take 20 mg by mouth at bedtime.   sertraline  25 MG tablet Commonly known as: ZOLOFT  Take 25 mg by mouth daily.   tamsulosin  0.4 MG Caps capsule Commonly known as: FLOMAX  TAKE 1 CAPSULE BY MOUTH EVERY DAY What changed: when to take this   torsemide  20 MG tablet Commonly known as: DEMADEX  Take 1 tablet (20 mg total) by mouth daily.   triamcinolone cream 0.1 % Commonly known as: KENALOG Apply 1 Application topically 2 (two) times daily. Apply liberally to bilateral lower legs.        Discharge Exam: Filed Weights   01/18/24 2345 01/20/24 1437  Weight: 86.1 kg 83 kg   Physical Exam HENT:     Head: Normocephalic.  Eyes:     General: Lids are normal.  Cardiovascular:     Rate and Rhythm: Normal rate and regular rhythm.     Heart sounds: S1 normal and S2 normal. Murmur heard.     Systolic murmur is present with a grade of 2/6.  Pulmonary:     Breath sounds: No decreased breath sounds, wheezing, rhonchi or rales.  Abdominal:     Palpations: Abdomen is soft.     Tenderness: There is no abdominal tenderness.  Musculoskeletal:     Right lower leg: No swelling.     Left lower leg: No swelling.  Skin:    General: Skin is warm.     Findings: No rash.  Neurological:     Mental Status: He is alert.     Comments: Answers all questions appropriately.  Able to wiggle toes on the left and half feeling in his feet.      Condition at discharge: stable  The results of significant diagnostics from  this hospitalization (including imaging, microbiology, ancillary and laboratory) are listed below for reference.   Imaging Studies: ECHOCARDIOGRAM COMPLETE Result Date: 01/20/2024    ECHOCARDIOGRAM REPORT   Patient Name:   EMITT MAGLIONE Date of Exam: 01/20/2024 Medical Rec #:  [id number]      Height:       67.0 in Accession #:    [id number]     Weight:       189.8 lb Date of Birth:  [birth year]      BSA:  1.978 m Patient Age:    89 years       BP:           154/67 mmHg Patient Gender: M              HR:           89 bpm. Exam Location:  [facility] Procedure: 2D Echo, Cardiac Doppler and Color Doppler (Both Spectral and Color            Flow Doppler were utilized during procedure). Indications:     Dyspnea R06.00  History:         Patient has prior history of Echocardiogram examinations, most                  recent 06/06/2017. Risk Factors:Hypertension and Dyslipidemia.                  Pneumonia.  Sonographer:     Broadus Canes Referring Phys:  [id number] GNFAOZHY A ARIDA Diagnosing Phys: Sammy Crisp MD IMPRESSIONS  1. Left ventricular ejection fraction, by estimation, is 70 to 75%. The left ventricle has hyperdynamic function. The left ventricle has no regional wall motion abnormalities. There is severe left ventricular hypertrophy. Left ventricular diastolic parameters are consistent with Grade I diastolic dysfunction (impaired relaxation). Elevated left atrial pressure.  2. Right ventricular systolic function is normal. The right ventricular size is normal. Tricuspid regurgitation signal is inadequate for assessing PA pressure.  3. Left atrial size was mildly dilated.  4. The mitral valve is abnormal. No evidence of mitral valve regurgitation. Moderate mitral annular calcification.  5. The aortic valve is tricuspid. There is moderate calcification of the aortic valve. There is severe thickening of the aortic valve. Aortic valve regurgitation is trivial. Moderate aortic valve stenosis. Aortic valve area, by VTI  measures 1.52 cm. Aortic valve mean gradient measures 29.0 mmHg. FINDINGS  Left Ventricle: Left ventricular ejection fraction, by estimation, is 70 to 75%. The left ventricle has hyperdynamic function. The left ventricle has no regional wall motion abnormalities. The left ventricular internal cavity size was normal in size. There is severe left ventricular hypertrophy. Left ventricular diastolic parameters are consistent with Grade I diastolic dysfunction (impaired relaxation). Elevated left atrial pressure. Right Ventricle: The right ventricular size is normal. No increase in right ventricular wall thickness. Right ventricular systolic function is normal. Tricuspid regurgitation signal is inadequate for assessing PA pressure. Left Atrium: Left atrial size was mildly dilated. Right Atrium: Right atrial size was normal in size. Pericardium: There is no evidence of pericardial effusion. Mitral Valve: The mitral valve is abnormal. There is moderate thickening of the mitral valve leaflet(s). There is moderate calcification of the mitral valve leaflet(s). Moderate mitral annular calcification. No evidence of mitral valve regurgitation. The  mean mitral valve gradient is 4.0 mmHg. Tricuspid Valve: The tricuspid valve is not well visualized. Tricuspid valve regurgitation is trivial. Aortic Valve: The aortic valve is tricuspid. There is moderate calcification of the aortic valve. There is severe thickening of the aortic valve. Aortic valve regurgitation is trivial. Moderate aortic stenosis is present. Aortic valve mean gradient measures 29.0 mmHg. Aortic valve peak gradient measures 42.2 mmHg. Aortic valve area, by VTI measures 1.52 cm. Pulmonic Valve: The pulmonic valve was not well visualized. Pulmonic valve regurgitation is mild. No evidence of pulmonic stenosis. Aorta: The aortic root is normal in size and structure. Pulmonary Artery: The pulmonary artery is not well seen. Venous: The inferior vena cava was not well  visualized. IAS/Shunts: The interatrial septum was not well visualized.  LEFT VENTRICLE PLAX 2D LVIDd:         3.40 cm   Diastology LVIDs:         2.30 cm   LV e' medial:    4.90 cm/s LV PW:         1.68 cm   LV E/e' medial:  16.4 LV IVS:        1.95 cm   LV e' lateral:   6.53 cm/s LVOT diam:     2.00 cm   LV E/e' lateral: 12.3 LV SV:         87 LV SV Index:   44 LVOT Area:     3.14 cm  RIGHT VENTRICLE RV Basal diam:  3.10 cm RV Mid diam:    2.50 cm RV S prime:     17.20 cm/s TAPSE (M-mode): 2.4 cm LEFT ATRIUM             Index        RIGHT ATRIUM           Index LA diam:        2.90 cm 1.47 cm/m   RA Area:     12.30 cm LA Vol (A2C):   82.0 ml 41.46 ml/m  RA Volume:   26.80 ml  13.55 ml/m LA Vol (A4C):   82.0 ml 41.46 ml/m LA Biplane Vol: 82.7 ml 41.81 ml/m  AORTIC VALVE AV Area (Vmax):    1.31 cm AV Area (Vmean):   1.32 cm AV Area (VTI):     1.52 cm AV Vmax:           324.67 cm/s AV Vmean:          240.000 cm/s AV VTI:            0.569 m AV Peak Grad:      42.2 mmHg AV Mean Grad:      29.0 mmHg LVOT Vmax:         135.00 cm/s LVOT Vmean:        101.000 cm/s LVOT VTI:          0.276 m LVOT/AV VTI ratio: 0.48  AORTA Ao Root diam: 2.70 cm MITRAL VALVE MV Area (PHT): 4.89 cm     SHUNTS MV Area VTI:   2.86 cm     Systemic VTI:  0.28 m MV Mean grad:  4.0 mmHg     Systemic Diam: 2.00 cm MV VTI:        0.30 m MV Decel Time: 155 msec MV E velocity: 80.20 cm/s MV A velocity: 151.00 cm/s MV E/A ratio:  0.53 Christopher End MD Electronically signed by Sammy Crisp MD Signature Date/Time: 01/20/2024/1:54:22 PM    Final    DG Chest Port 1 View Result Date: 01/19/2024 CLINICAL DATA:  Pleural effusion EXAM: PORTABLE CHEST 1 VIEW COMPARISON:  January 18, 2024 FINDINGS: Comparison with prior examination there is marked improvement in the left pleural effusion post thoracentesis with a small residual left effusion in the left lower lobe atelectasis Again noted are multiple left posterior and lateral rib fractures without  evidence of pneumothorax. The right lung is clear IMPRESSION: Marked improvement in the left pleural effusion post thoracentesis with a small residual left effusion in the left lower lobe atelectasis. Electronically Signed   By: Fredrich Jefferson M.D.   On: 01/19/2024 12:39   US  THORACENTESIS ASP PLEURAL SPACE W/IMG GUIDE Result Date: 01/19/2024 INDICATION: 89 year old  male on aspirin  with a history of myelodysplastic syndrome recently admitted due to fall and left 3-9th rib fracture. CT chest with large left-sided pleural effusion. Request for diagnostic and therapeutic thoracentesis. EXAM: ULTRASOUND GUIDED left THORACENTESIS MEDICATIONS: 1% lidocaine , 6 mL. COMPLICATIONS: None immediate. PROCEDURE: An ultrasound guided thoracentesis was thoroughly discussed with the patient and questions answered. The benefits, risks, alternatives and complications were also discussed. The patient understands and wishes to proceed with the procedure. Written consent was obtained. Ultrasound was performed to localize and mark an adequate pocket of fluid in the left chest. The area was then prepped and draped in the normal sterile fashion. 1% Lidocaine  was used for local anesthesia. Under ultrasound guidance a 6 Fr Safe-T-Centesis catheter was introduced. Thoracentesis was performed. The catheter was removed and a dressing applied. FINDINGS: A total of approximately 2.2 L of bloody fluid was removed. Samples were sent to the laboratory as requested by the clinical team. IMPRESSION: Successful ultrasound guided left thoracentesis yielding 2.2 L of pleural fluid. Procedure performed by: Estella Helling, PA-C under the supervision of Dr. Sylvester Evert. Electronically Signed   By: Melven Stable.  Shick M.D.   On: 01/19/2024 12:13   CT HEAD WO CONTRAST ( ) Result Date: 01/19/2024 CLINICAL DATA:  Initial evaluation for acute head trauma. EXAM: CT HEAD WITHOUT CONTRAST TECHNIQUE: Contiguous axial images were obtained from the base of the skull through  the vertex without intravenous contrast. RADIATION DOSE REDUCTION: This exam was performed according to the departmental dose-optimization program which includes automated exposure control, adjustment of the mA and/or kV according to patient size and/or use of iterative reconstruction technique. COMPARISON:  Prior study from 08/25/2022 FINDINGS: Brain: Examination degraded by motion. Generalized age-related cerebral atrophy. Patchy hypodensity involving the supratentorial cerebral white matter, consistent with chronic small vessel ischemic disease, moderate in nature. No acute intracranial hemorrhage. No acute large vessel territory infarct. No mass lesion, midline shift or mass effect. No hydrocephalus or extra-axial fluid collection. Vascular: No abnormal hyperdense vessel. Calcified atherosclerosis present at the skull base. Skull: Scalp soft tissues demonstrate no acute finding. Calvarium intact. Sinuses/Orbits: Globes orbital soft tissues demonstrate no acute finding. Mild mucosal thickening noted about the right ethmoidal air cells. Paranasal sinuses are otherwise largely clear. Trace right mastoid effusion noted, of doubtful significance. Other: None. IMPRESSION: 1. No acute intracranial abnormality. 2. Generalized age-related cerebral atrophy with moderate chronic small vessel ischemic disease. Electronically Signed   By: Virgia Griffins M.D.   On: 01/19/2024 02:18   CT CHEST WO CONTRAST Result Date: 01/19/2024 CLINICAL DATA:  Blunt chest trauma. EXAM: CT CHEST WITHOUT CONTRAST TECHNIQUE: Multidetector CT imaging of the chest was performed following the standard protocol without IV contrast. RADIATION DOSE REDUCTION: This exam was performed according to the departmental dose-optimization program which includes automated exposure control, adjustment of the mA and/or kV according to patient size and/or use of iterative reconstruction technique. COMPARISON:  Chest radiograph 01/18/2024.  CT chest  11/30/2023 FINDINGS: Cardiovascular: Normal heart size. Small pericardial effusion. Normal caliber thoracic aorta. Calcification of the aorta and coronary arteries. Mediastinum/Nodes: Thyroid  gland is unremarkable. No significant lymphadenopathy. Calcified lymph nodes are present in the right hilum consistent with post inflammatory granulomas. Esophagus is decompressed. Lungs/Pleura: Large left pleural effusion demonstrating significant progression since previous study. Collapse and consolidation of the left lung with some residual aeration of the left upper lung. No pneumothorax. Mild atelectasis in the right lung base. Upper Abdomen: Calcified granulomas in the spleen. Cholelithiasis without inflammatory change in the gallbladder. Musculoskeletal:  Displaced fractures of multiple left lateral ribs, not significantly changed since prior study. Visualized spine and sternum appear intact. Degenerative changes in the spine. IMPRESSION: 1. Large left pleural effusion with collapse and consolidation of most of the left lung, demonstrating significant progression since prior study. Mild atelectasis in the right lung base. 2. Small pericardial effusion. 3. Multiple left rib fractures with displacement and some. No change since prior study. 4. Cholelithiasis. Electronically Signed   By: Boyce Byes M.D.   On: 01/19/2024 01:48   DG Chest Portable 1 View Result Date: 01/18/2024 CLINICAL DATA:  Preoperative study.  Trip and fall injury. EXAM: PORTABLE CHEST 1 VIEW COMPARISON:  12/01/2023 FINDINGS: There is diffuse opacification of the left hemithorax with some aeration of the left upper lung. This is likely a large left pleural effusion with atelectasis or consolidation in the left lower lung. This is progressing since the prior study. Right lung is clear. No pneumothorax. Heart size is obscured by the left chest process. Multiple displaced left rib fractures are similar to prior study. IMPRESSION: Large left pleural  effusion with likely left lung atelectasis/consolidation causing diffuse opacification of the left hemithorax and progressing since prior study. Acute appearing left rib fractures are unchanged since prior study. Electronically Signed   By: Boyce Byes M.D.   On: 01/18/2024 22:54   CT Hip Left Wo Contrast Result Date: 01/18/2024 CLINICAL DATA:  Left hip fracture. Trip and fall injury to the left hip. EXAM: CT OF THE LEFT HIP WITHOUT CONTRAST TECHNIQUE: Multidetector CT imaging of the left hip was performed according to the standard protocol. Multiplanar CT image reconstructions were also generated. RADIATION DOSE REDUCTION: This exam was performed according to the departmental dose-optimization program which includes automated exposure control, adjustment of the mA and/or kV according to patient size and/or use of iterative reconstruction technique. COMPARISON:  Left hip radiograph 01/18/2024 FINDINGS: Bones/Joint/Cartilage Comminuted fractures of the left femoral neck involving the subcapital region and extending into the inter trochanteric region at the level of the lesser trochanter. Mild varus angulation. Visualized portion of the acetabulum and left hemipelvis appears intact. Ligaments Suboptimally assessed by CT. Muscles and Tendons No intramuscular hematoma. Soft tissues Visualized pelvic organs appear intact. Mild soft tissue stranding in the subcutaneous fat over the left hip consistent with contusion. No loculated collections. Vascular calcifications are present. IMPRESSION: 1. Comminuted fractures of the left femoral neck extending to the lesser trochanter. Mild varus angulation. Electronically Signed   By: Boyce Byes M.D.   On: 01/18/2024 22:41   DG Hip Unilat W or Wo Pelvis 2-3 Views Left Result Date: 01/18/2024 CLINICAL DATA:  Fall and pain EXAM: DG HIP (WITH OR WITHOUT PELVIS) 2-3V LEFT COMPARISON:  Left hip x-ray 08/09/2021 FINDINGS: The bones are osteopenic. There are findings  suspicious for nondisplaced left femoral inter trochanteric fracture. There is no dislocation. There are mild degenerative changes of both hips. Soft tissues are within normal limits. IMPRESSION: Findings suspicious for nondisplaced left femoral intertrochanteric fracture. Electronically Signed   By: Tyron Gallon M.D.   On: 01/18/2024 22:10    Microbiology: Results for orders placed or performed during the hospital encounter of 01/18/24  Resp panel by RT-PCR (RSV, Flu A&B, Covid) Anterior Nasal Swab     Status: None   Collection Time: 01/19/24  8:23 AM   Specimen: Anterior Nasal Swab  Result Value Ref Range Status   SARS Coronavirus 2 by RT PCR NEGATIVE NEGATIVE Final    Comment: (NOTE) SARS-CoV-2 target nucleic  acids are NOT DETECTED.  The SARS-CoV-2 RNA is generally detectable in upper respiratory specimens during the acute phase of infection. The lowest concentration of SARS-CoV-2 viral copies this assay can detect is 138 copies/mL. A negative result does not preclude SARS-Cov-2 infection and should not be used as the sole basis for treatment or other patient management decisions. A negative result may occur with  improper specimen collection/handling, submission of specimen other than nasopharyngeal swab, presence of viral mutation(s) within the areas targeted by this assay, and inadequate number of viral copies(<138 copies/mL). A negative result must be combined with clinical observations, patient history, and epidemiological information. The expected result is Negative.  Fact Sheet for Patients:  BloggerCourse.com  Fact Sheet for Healthcare Providers:  SeriousBroker.it  This test is no t yet approved or cleared by the [place]  FDA and  has been authorized for detection and/or diagnosis of SARS-CoV-2 by FDA under an Emergency Use Authorization (EUA). This EUA will remain  in effect (meaning this test can be used) for the  duration of the COVID-19 declaration under Section 564(b)(1) of the Act, 21 U.S.C.section 360bbb-3(b)(1), unless the authorization is terminated  or revoked sooner.       Influenza A by PCR NEGATIVE NEGATIVE Final   Influenza B by PCR NEGATIVE NEGATIVE Final    Comment: (NOTE) The Xpert Xpress SARS-CoV-2/FLU/RSV plus assay is intended as an aid in the diagnosis of influenza from Nasopharyngeal swab specimens and should not be used as a sole basis for treatment. Nasal washings and aspirates are unacceptable for Xpert Xpress SARS-CoV-2/FLU/RSV testing.  Fact Sheet for Patients: BloggerCourse.com  Fact Sheet for Healthcare Providers: SeriousBroker.it  This test is not yet approved or cleared by the [place]  FDA and has been authorized for detection and/or diagnosis of SARS-CoV-2 by FDA under an Emergency Use Authorization (EUA). This EUA will remain in effect (meaning this test can be used) for the duration of the COVID-19 declaration under Section 564(b)(1) of the Act, 21 U.S.C. section 360bbb-3(b)(1), unless the authorization is terminated or revoked.     Resp Syncytial Virus by PCR NEGATIVE NEGATIVE Final    Comment: (NOTE) Fact Sheet for Patients: BloggerCourse.com  Fact Sheet for Healthcare Providers: SeriousBroker.it  This test is not yet approved or cleared by the [place]  FDA and has been authorized for detection and/or diagnosis of SARS-CoV-2 by FDA under an Emergency Use Authorization (EUA). This EUA will remain in effect (meaning this test can be used) for the duration of the COVID-19 declaration under Section 564(b)(1) of the Act, 21 U.S.C. section 360bbb-3(b)(1), unless the authorization is terminated or revoked.  Performed at [facility], [address] [place], [place] [address]   Culture, Fungus without Smear     Status: None  (Preliminary result)   Collection Time: 01/19/24 12:00 PM   Specimen: PATH Cytology Pleural fluid  Result Value Ref Range Status   Specimen Description   Final    PLEURAL Performed at [facility], [address] [place], [place] [address]    Special Requests   Final    NONE Performed at [facility], [address] [place], [place] [address]    Culture   Final    NO FUNGUS ISOLATED AFTER 2 DAYS Performed at [facility], [address], [place], [place] [address]    Report Status PENDING  Incomplete  Acid Fast Smear (AFB)     Status: None   Collection Time: 01/19/24 12:00 PM  Specimen: PATH Cytology Pleural fluid  Result Value Ref Range Status   AFB Specimen Processing Concentration  Final   Acid Fast Smear Negative  Final    Comment: (NOTE) Performed At: [facility] [address] [place], [place] [id number] Pearlean Botts MD WU:[id number]    Source (AFB) PLEURAL  Final    Comment: Performed at [facility], [address] [place], [place] [address]  Body fluid culture w Gram Stain     Status: None (Preliminary result)   Collection Time: 01/19/24 12:00 PM   Specimen: PATH Cytology Pleural fluid  Result Value Ref Range Status   Specimen Description   Final    PLEURAL Performed at [facility], [address] [place], [place] [address]    Special Requests   Final    NONE Performed at [facility], [address] [place], [place] [address]    Gram Stain NO WBC SEEN NO ORGANISMS SEEN   Final   Culture   Final    NO GROWTH 2 DAYS Performed at [facility], [address], [place], [place] [address]    Report Status PENDING  Incomplete   *Note: Due to a large number of results and/or encounters for the requested time period, some results have not been displayed. A complete set of results can be found in Results Review.    Labs: CBC: Recent Labs  Lab 01/18/24 2053 01/19/24 0249  01/20/24 0259 01/21/24 0309  WBC 3.9* 3.7* 2.6* 2.9*  NEUTROABS 2.3  --   --  1.8  HGB 9.5* 8.7* 8.0* 7.9*  HCT 29.9* 26.9* 25.6* 24.7*  MCV 111.6* 109.8* 110.8* 110.3*  PLT 82* 65* 54* 55*   Basic Metabolic Panel: Recent Labs  Lab 01/18/24 2053 01/19/24 0249 01/20/24 0259 01/21/24 0309  NA 135 137 140 138  K 4.3 4.1 4.2 4.1  CL 109 111 110 109  CO2 20* 21* 24 23  GLUCOSE 171* 102* 127* 143*  BUN 20 19 19 21   CREATININE 1.26* 1.09 1.21 1.15  CALCIUM  8.3* 8.1* 8.0* 8.0*  MG  --  2.1  --   --    Liver Function Tests: No results for input(s): "AST", "ALT", "ALKPHOS", "BILITOT", "PROT", "ALBUMIN" in the last 168 hours. CBG: Recent Labs  Lab 01/20/24 1440 01/20/24 1836 01/20/24 2101 01/21/24 0754 01/21/24 1133  GLUCAP 135* 166* 194* 147* 199*    Discharge time spent: greater than 30 minutes.  Signed: Verla Glaze, MD [facility] 01/21/2024

## 2024-01-21 NOTE — Assessment & Plan Note (Signed)
 On Zoloft.

## 2024-01-21 NOTE — Assessment & Plan Note (Signed)
 Pain control

## 2024-01-21 NOTE — Assessment & Plan Note (Signed)
 On 6/2 interventional radiology drew 2.2 L of bloody fluid from underneath the left lung.  Cultures so far negative.

## 2024-01-21 NOTE — Assessment & Plan Note (Signed)
 Orthopedic surgery recommended transfer to tertiary care hospital when bed available for surgery.  Patient seen by cardiology and can undergo procedure.  Echocardiogram showed an EF of 70 to 75%

## 2024-01-21 NOTE — Assessment & Plan Note (Signed)
 Continue pravastatin

## 2024-01-21 NOTE — Assessment & Plan Note (Signed)
 Secondary to large pleural effusion.  Patient currently down to 2 L.  On presentation was on 4 L with a pulse ox of 91%.

## 2024-01-21 NOTE — Plan of Care (Signed)
  Problem: Clinical Measurements: Goal: Ability to maintain clinical measurements within normal limits will improve Outcome: Progressing Goal: Respiratory complications will improve Outcome: Progressing   Problem: Activity: Goal: Risk for activity intolerance will decrease Outcome: Progressing   Problem: Nutrition: Goal: Adequate nutrition will be maintained Outcome: Progressing

## 2024-01-21 NOTE — Assessment & Plan Note (Signed)
 Continue to monitor counts.  Patient may end up needing a transfusion at some point.  Needs washed red blood cells and platelets.

## 2024-01-21 NOTE — Hospital Course (Addendum)
 89 y.o. male with medical history significant of dCHF, HTN, HLD, dCHF, GERD, dementia, BPH, CKD-3b, depression pancytopenia, smoldering myeloma,  hairy cell leukemia in remission, myelodysplastic syndrome, low grade, multiple myeloma, myelodysplastic syndrome, low grader, on immunotherapy, recent admission due to fall and left 3-9th rib fracture in April (4/13-4/22), who presents with fall and left hip pain. ..." See H&P for full HPI on admission & ED course.   Patient was admitted for further evaluation and management of  --Left hip fracture --Large left pleural effusion complicated by acute respiratory failure with hypoxia   Pulmonology was consulted for the pleural effusion and Cardiology for perioperative risk assessment.  Further hospital course and management as outlined below.  6/2.  Interventional radiology drew off 2.2 L of bloody fluid with left thoracentesis.  Cultures negative for 2 days. 6/3.  Orthopedic surgery recommended transfer to a tertiary care center secondary to not having washed packed red blood cells and platelets close to this hospital. 6/4.  Patient accepted to [facility] when bed available

## 2024-01-21 NOTE — Assessment & Plan Note (Signed)
 Last creatinine 1.15 with a GFR greater than 60

## 2024-01-21 NOTE — Assessment & Plan Note (Signed)
 Without behavioral disturbance.  Not on any medications.

## 2024-01-21 NOTE — Assessment & Plan Note (Signed)
 On Flomax 

## 2024-01-21 NOTE — Assessment & Plan Note (Signed)
 Continue Norvasc  and torsemide 

## 2024-01-22 ENCOUNTER — Encounter: Admit: 2024-01-22 | Payer: Medicare (Managed Care)

## 2024-01-22 ENCOUNTER — Ambulatory Visit: Admit: 2024-01-22 | Payer: Medicare (Managed Care)

## 2024-01-22 ENCOUNTER — Ambulatory Visit: Admit: 2024-01-22 | Discharge: 2024-01-30 | Payer: Medicare (Managed Care)

## 2024-01-22 ENCOUNTER — Encounter: Admit: 2024-01-22 | Discharge: 2024-01-30 | Payer: Medicare (Managed Care)

## 2024-01-22 ENCOUNTER — Inpatient Hospital Stay
Admit: 2024-01-22 | Discharge: 2024-01-30 | Disposition: A | Discharge status: Skilled Nursing Facility | Payer: Medicare (Managed Care) | Source: Other Acute Inpatient Hospital | Admitting: Orthopaedic Trauma

## 2024-01-22 ENCOUNTER — Ambulatory Visit
Admit: 2024-01-22 | Discharge: 2024-01-30 | Disposition: A | Discharge status: Skilled Nursing Facility | Payer: Medicare (Managed Care) | Source: Other Acute Inpatient Hospital | Admitting: Orthopaedic Trauma

## 2024-01-22 DIAGNOSIS — J984 Other disorders of lung: Secondary | ICD-10-CM | POA: Diagnosis not present

## 2024-01-22 DIAGNOSIS — S72002D Fracture of unspecified part of neck of left femur, subsequent encounter for closed fracture with routine healing: Secondary | ICD-10-CM | POA: Diagnosis not present

## 2024-01-22 DIAGNOSIS — F418 Other specified anxiety disorders: Secondary | ICD-10-CM | POA: Diagnosis not present

## 2024-01-22 DIAGNOSIS — M25552 Pain in left hip: Secondary | ICD-10-CM | POA: Diagnosis not present

## 2024-01-22 DIAGNOSIS — Z471 Aftercare following joint replacement surgery: Secondary | ICD-10-CM | POA: Diagnosis not present

## 2024-01-22 DIAGNOSIS — Z4789 Encounter for other orthopedic aftercare: Secondary | ICD-10-CM | POA: Diagnosis not present

## 2024-01-22 DIAGNOSIS — J439 Emphysema, unspecified: Secondary | ICD-10-CM | POA: Diagnosis not present

## 2024-01-22 DIAGNOSIS — Z743 Need for continuous supervision: Secondary | ICD-10-CM | POA: Diagnosis not present

## 2024-01-22 DIAGNOSIS — C9000 Multiple myeloma not having achieved remission: Secondary | ICD-10-CM | POA: Diagnosis not present

## 2024-01-22 DIAGNOSIS — E785 Hyperlipidemia, unspecified: Secondary | ICD-10-CM | POA: Diagnosis not present

## 2024-01-22 DIAGNOSIS — K219 Gastro-esophageal reflux disease without esophagitis: Secondary | ICD-10-CM | POA: Diagnosis not present

## 2024-01-22 DIAGNOSIS — R532 Functional quadriplegia: Secondary | ICD-10-CM | POA: Diagnosis not present

## 2024-01-22 DIAGNOSIS — E1140 Type 2 diabetes mellitus with diabetic neuropathy, unspecified: Secondary | ICD-10-CM | POA: Diagnosis not present

## 2024-01-22 DIAGNOSIS — S72142A Displaced intertrochanteric fracture of left femur, initial encounter for closed fracture: Secondary | ICD-10-CM | POA: Diagnosis not present

## 2024-01-22 DIAGNOSIS — J90 Pleural effusion, not elsewhere classified: Secondary | ICD-10-CM | POA: Diagnosis not present

## 2024-01-22 DIAGNOSIS — K5900 Constipation, unspecified: Secondary | ICD-10-CM | POA: Diagnosis not present

## 2024-01-22 DIAGNOSIS — D62 Acute posthemorrhagic anemia: Secondary | ICD-10-CM | POA: Diagnosis not present

## 2024-01-22 DIAGNOSIS — E119 Type 2 diabetes mellitus without complications: Secondary | ICD-10-CM | POA: Diagnosis not present

## 2024-01-22 DIAGNOSIS — I5032 Chronic diastolic (congestive) heart failure: Secondary | ICD-10-CM | POA: Diagnosis not present

## 2024-01-22 DIAGNOSIS — W19XXXD Unspecified fall, subsequent encounter: Secondary | ICD-10-CM | POA: Diagnosis not present

## 2024-01-22 DIAGNOSIS — I129 Hypertensive chronic kidney disease with stage 1 through stage 4 chronic kidney disease, or unspecified chronic kidney disease: Secondary | ICD-10-CM | POA: Diagnosis not present

## 2024-01-22 DIAGNOSIS — W19XXXA Unspecified fall, initial encounter: Secondary | ICD-10-CM | POA: Diagnosis not present

## 2024-01-22 DIAGNOSIS — G8918 Other acute postprocedural pain: Secondary | ICD-10-CM | POA: Diagnosis not present

## 2024-01-22 DIAGNOSIS — R5381 Other malaise: Secondary | ICD-10-CM | POA: Diagnosis not present

## 2024-01-22 DIAGNOSIS — Z66 Do not resuscitate: Secondary | ICD-10-CM | POA: Diagnosis not present

## 2024-01-22 DIAGNOSIS — J9811 Atelectasis: Secondary | ICD-10-CM | POA: Diagnosis not present

## 2024-01-22 DIAGNOSIS — N1831 Chronic kidney disease, stage 3a: Secondary | ICD-10-CM | POA: Diagnosis not present

## 2024-01-22 DIAGNOSIS — R279 Unspecified lack of coordination: Secondary | ICD-10-CM | POA: Diagnosis not present

## 2024-01-22 DIAGNOSIS — Z23 Encounter for immunization: Secondary | ICD-10-CM | POA: Diagnosis not present

## 2024-01-22 DIAGNOSIS — Z96642 Presence of left artificial hip joint: Secondary | ICD-10-CM | POA: Diagnosis not present

## 2024-01-22 DIAGNOSIS — G309 Alzheimer's disease, unspecified: Secondary | ICD-10-CM | POA: Diagnosis not present

## 2024-01-22 DIAGNOSIS — F32A Depression, unspecified: Secondary | ICD-10-CM | POA: Diagnosis not present

## 2024-01-22 DIAGNOSIS — F5101 Primary insomnia: Secondary | ICD-10-CM | POA: Diagnosis not present

## 2024-01-22 DIAGNOSIS — E7800 Pure hypercholesterolemia, unspecified: Secondary | ICD-10-CM | POA: Diagnosis not present

## 2024-01-22 DIAGNOSIS — N400 Enlarged prostate without lower urinary tract symptoms: Secondary | ICD-10-CM | POA: Diagnosis not present

## 2024-01-22 DIAGNOSIS — R2681 Unsteadiness on feet: Secondary | ICD-10-CM | POA: Diagnosis not present

## 2024-01-22 DIAGNOSIS — C9141 Hairy cell leukemia, in remission: Secondary | ICD-10-CM | POA: Diagnosis not present

## 2024-01-22 DIAGNOSIS — R0902 Hypoxemia: Secondary | ICD-10-CM | POA: Diagnosis not present

## 2024-01-22 DIAGNOSIS — S2242XD Multiple fractures of ribs, left side, subsequent encounter for fracture with routine healing: Secondary | ICD-10-CM | POA: Diagnosis not present

## 2024-01-22 DIAGNOSIS — E569 Vitamin deficiency, unspecified: Secondary | ICD-10-CM | POA: Diagnosis not present

## 2024-01-22 DIAGNOSIS — Z794 Long term (current) use of insulin: Secondary | ICD-10-CM | POA: Diagnosis not present

## 2024-01-22 DIAGNOSIS — Z882 Allergy status to sulfonamides status: Secondary | ICD-10-CM | POA: Diagnosis not present

## 2024-01-22 DIAGNOSIS — J939 Pneumothorax, unspecified: Secondary | ICD-10-CM | POA: Diagnosis not present

## 2024-01-22 DIAGNOSIS — Z01818 Encounter for other preprocedural examination: Secondary | ICD-10-CM | POA: Diagnosis not present

## 2024-01-22 DIAGNOSIS — D469 Myelodysplastic syndrome, unspecified: Secondary | ICD-10-CM | POA: Diagnosis not present

## 2024-01-22 DIAGNOSIS — I5030 Unspecified diastolic (congestive) heart failure: Secondary | ICD-10-CM | POA: Diagnosis not present

## 2024-01-22 DIAGNOSIS — I5033 Acute on chronic diastolic (congestive) heart failure: Secondary | ICD-10-CM | POA: Diagnosis not present

## 2024-01-22 DIAGNOSIS — S72002A Fracture of unspecified part of neck of left femur, initial encounter for closed fracture: Secondary | ICD-10-CM | POA: Diagnosis present

## 2024-01-22 DIAGNOSIS — A499 Bacterial infection, unspecified: Secondary | ICD-10-CM | POA: Diagnosis not present

## 2024-01-22 DIAGNOSIS — M84552A Pathological fracture in neoplastic disease, left femur, initial encounter for fracture: Secondary | ICD-10-CM | POA: Diagnosis not present

## 2024-01-22 DIAGNOSIS — R918 Other nonspecific abnormal finding of lung field: Secondary | ICD-10-CM | POA: Diagnosis not present

## 2024-01-22 DIAGNOSIS — I10 Essential (primary) hypertension: Secondary | ICD-10-CM | POA: Diagnosis not present

## 2024-01-22 DIAGNOSIS — D696 Thrombocytopenia, unspecified: Secondary | ICD-10-CM | POA: Diagnosis not present

## 2024-01-22 DIAGNOSIS — N1832 Chronic kidney disease, stage 3b: Secondary | ICD-10-CM | POA: Diagnosis not present

## 2024-01-22 DIAGNOSIS — F0280 Dementia in other diseases classified elsewhere without behavioral disturbance: Secondary | ICD-10-CM | POA: Diagnosis not present

## 2024-01-22 DIAGNOSIS — S2242XA Multiple fractures of ribs, left side, initial encounter for closed fracture: Secondary | ICD-10-CM | POA: Diagnosis not present

## 2024-01-22 DIAGNOSIS — D61818 Other pancytopenia: Secondary | ICD-10-CM | POA: Diagnosis not present

## 2024-01-22 DIAGNOSIS — D539 Nutritional anemia, unspecified: Secondary | ICD-10-CM | POA: Diagnosis not present

## 2024-01-22 DIAGNOSIS — D538 Other specified nutritional anemias: Secondary | ICD-10-CM | POA: Diagnosis not present

## 2024-01-22 DIAGNOSIS — Z7982 Long term (current) use of aspirin: Secondary | ICD-10-CM | POA: Diagnosis not present

## 2024-01-22 DIAGNOSIS — E1165 Type 2 diabetes mellitus with hyperglycemia: Secondary | ICD-10-CM | POA: Diagnosis not present

## 2024-01-22 DIAGNOSIS — Z86008 Personal history of in-situ neoplasm of other site: Secondary | ICD-10-CM | POA: Diagnosis not present

## 2024-01-22 DIAGNOSIS — R4182 Altered mental status, unspecified: Secondary | ICD-10-CM | POA: Diagnosis not present

## 2024-01-22 DIAGNOSIS — E1122 Type 2 diabetes mellitus with diabetic chronic kidney disease: Secondary | ICD-10-CM | POA: Diagnosis not present

## 2024-01-22 DIAGNOSIS — I130 Hypertensive heart and chronic kidney disease with heart failure and stage 1 through stage 4 chronic kidney disease, or unspecified chronic kidney disease: Secondary | ICD-10-CM | POA: Diagnosis not present

## 2024-01-22 DIAGNOSIS — D649 Anemia, unspecified: Secondary | ICD-10-CM | POA: Diagnosis not present

## 2024-01-22 DIAGNOSIS — M6259 Muscle wasting and atrophy, not elsewhere classified, multiple sites: Secondary | ICD-10-CM | POA: Diagnosis not present

## 2024-01-22 LAB — BODY FLUID CULTURE W GRAM STAIN
Culture: NO GROWTH
Gram Stain: NONE SEEN

## 2024-01-22 MED ORDER — HYDROCODONE 5 MG-ACETAMINOPHEN 325 MG TABLET
Freq: Four times a day (QID) | ORAL | 0 refills | 0.00000 days | PRN
Start: 2024-01-22 — End: 2024-01-27

## 2024-01-22 MED ORDER — GABAPENTIN 100 MG CAPSULE
ORAL_CAPSULE | Freq: Three times a day (TID) | ORAL | 0 refills | 21.00000 days
Start: 2024-01-22 — End: 2024-02-12

## 2024-01-22 MED ORDER — CALCIUM 300 MG (AS CALCIUM CARBONATE 750 MG) CHEWABLE TABLET
ORAL_TABLET | Freq: Every day | ORAL | 0 refills | 30.00000 days
Start: 2024-01-22 — End: 2024-02-21

## 2024-01-22 MED ORDER — CHOLECALCIFEROL (VITAMIN D3) 50 MCG (2,000 UNIT) TABLET
ORAL_TABLET | Freq: Every day | ORAL | 0 refills | 90.00000 days
Start: 2024-01-22 — End: 2024-04-21

## 2024-01-22 NOTE — Progress Notes (Signed)
 Pt was picked up by EMS and transported to [facility] health. VSS. Pt denied any pain or discomfort. Report given to RN Kirstie Percy Masters at [facility] to RM 5202. Talked to Pt's daughter Vannie Gentile and made her aware of pt's transfer.

## 2024-01-27 ENCOUNTER — Ambulatory Visit: Admitting: Dermatology

## 2024-01-28 ENCOUNTER — Other Ambulatory Visit

## 2024-01-28 ENCOUNTER — Ambulatory Visit

## 2024-01-28 ENCOUNTER — Ambulatory Visit: Admitting: Oncology

## 2024-01-29 MED ORDER — INSULIN GLARGINE (U-100) 100 UNIT/ML SUBCUTANEOUS SOLUTION
Freq: Every evening | SUBCUTANEOUS | 2 refills | 30.00000 days
Start: 2024-01-29 — End: 2024-04-28

## 2024-01-29 MED ORDER — ACETAMINOPHEN 500 MG TABLET
ORAL_TABLET | Freq: Three times a day (TID) | ORAL | 0 refills | 5.00000 days
Start: 2024-01-29 — End: ?

## 2024-01-29 MED ORDER — INSULIN LISPRO (U-100) 100 UNIT/ML SUBCUTANEOUS PEN
1 refills | 0.00000 days
Start: 2024-01-29 — End: ?

## 2024-01-29 MED ORDER — MELATONIN 3 MG TABLET
ORAL_TABLET | Freq: Every evening | ORAL | 0 refills | 30.00000 days | PRN
Start: 2024-01-29 — End: ?

## 2024-01-30 DIAGNOSIS — L97522 Non-pressure chronic ulcer of other part of left foot with fat layer exposed: Secondary | ICD-10-CM | POA: Diagnosis not present

## 2024-01-30 DIAGNOSIS — N1831 Chronic kidney disease, stage 3a: Secondary | ICD-10-CM | POA: Diagnosis not present

## 2024-01-30 DIAGNOSIS — E1140 Type 2 diabetes mellitus with diabetic neuropathy, unspecified: Secondary | ICD-10-CM | POA: Diagnosis not present

## 2024-01-30 DIAGNOSIS — D649 Anemia, unspecified: Secondary | ICD-10-CM | POA: Diagnosis not present

## 2024-01-30 DIAGNOSIS — E441 Mild protein-calorie malnutrition: Secondary | ICD-10-CM | POA: Diagnosis not present

## 2024-01-30 DIAGNOSIS — F32A Depression, unspecified: Secondary | ICD-10-CM | POA: Diagnosis not present

## 2024-01-30 DIAGNOSIS — C9000 Multiple myeloma not having achieved remission: Secondary | ICD-10-CM | POA: Diagnosis not present

## 2024-01-30 DIAGNOSIS — E1122 Type 2 diabetes mellitus with diabetic chronic kidney disease: Secondary | ICD-10-CM | POA: Diagnosis not present

## 2024-01-30 DIAGNOSIS — J90 Pleural effusion, not elsewhere classified: Secondary | ICD-10-CM | POA: Diagnosis not present

## 2024-01-30 DIAGNOSIS — D696 Thrombocytopenia, unspecified: Secondary | ICD-10-CM | POA: Diagnosis not present

## 2024-01-30 DIAGNOSIS — K219 Gastro-esophageal reflux disease without esophagitis: Secondary | ICD-10-CM | POA: Diagnosis not present

## 2024-01-30 DIAGNOSIS — W19XXXD Unspecified fall, subsequent encounter: Secondary | ICD-10-CM | POA: Diagnosis not present

## 2024-01-30 DIAGNOSIS — B3789 Other sites of candidiasis: Secondary | ICD-10-CM | POA: Diagnosis not present

## 2024-01-30 DIAGNOSIS — R4182 Altered mental status, unspecified: Secondary | ICD-10-CM | POA: Diagnosis not present

## 2024-01-30 DIAGNOSIS — E1165 Type 2 diabetes mellitus with hyperglycemia: Secondary | ICD-10-CM | POA: Diagnosis not present

## 2024-01-30 DIAGNOSIS — N400 Enlarged prostate without lower urinary tract symptoms: Secondary | ICD-10-CM | POA: Diagnosis not present

## 2024-01-30 DIAGNOSIS — E663 Overweight: Secondary | ICD-10-CM | POA: Diagnosis not present

## 2024-01-30 DIAGNOSIS — E785 Hyperlipidemia, unspecified: Secondary | ICD-10-CM | POA: Diagnosis not present

## 2024-01-30 DIAGNOSIS — F5101 Primary insomnia: Secondary | ICD-10-CM | POA: Diagnosis not present

## 2024-01-30 DIAGNOSIS — R296 Repeated falls: Secondary | ICD-10-CM | POA: Diagnosis not present

## 2024-01-30 DIAGNOSIS — Z741 Need for assistance with personal care: Secondary | ICD-10-CM | POA: Diagnosis not present

## 2024-01-30 DIAGNOSIS — R451 Restlessness and agitation: Secondary | ICD-10-CM | POA: Diagnosis not present

## 2024-01-30 DIAGNOSIS — S2242XD Multiple fractures of ribs, left side, subsequent encounter for fracture with routine healing: Secondary | ICD-10-CM | POA: Diagnosis not present

## 2024-01-30 DIAGNOSIS — Z86008 Personal history of in-situ neoplasm of other site: Secondary | ICD-10-CM | POA: Diagnosis not present

## 2024-01-30 DIAGNOSIS — S72002D Fracture of unspecified part of neck of left femur, subsequent encounter for closed fracture with routine healing: Secondary | ICD-10-CM | POA: Diagnosis not present

## 2024-01-30 DIAGNOSIS — E569 Vitamin deficiency, unspecified: Secondary | ICD-10-CM | POA: Diagnosis not present

## 2024-01-30 DIAGNOSIS — L89151 Pressure ulcer of sacral region, stage 1: Secondary | ICD-10-CM | POA: Diagnosis not present

## 2024-01-30 DIAGNOSIS — R2681 Unsteadiness on feet: Secondary | ICD-10-CM | POA: Diagnosis not present

## 2024-01-30 DIAGNOSIS — E08620 Diabetes mellitus due to underlying condition with diabetic dermatitis: Secondary | ICD-10-CM | POA: Diagnosis not present

## 2024-01-30 DIAGNOSIS — N189 Chronic kidney disease, unspecified: Secondary | ICD-10-CM | POA: Diagnosis not present

## 2024-01-30 DIAGNOSIS — G309 Alzheimer's disease, unspecified: Secondary | ICD-10-CM | POA: Diagnosis not present

## 2024-01-30 DIAGNOSIS — I10 Essential (primary) hypertension: Secondary | ICD-10-CM | POA: Diagnosis not present

## 2024-01-30 DIAGNOSIS — Z23 Encounter for immunization: Secondary | ICD-10-CM | POA: Diagnosis not present

## 2024-01-30 DIAGNOSIS — M6259 Muscle wasting and atrophy, not elsewhere classified, multiple sites: Secondary | ICD-10-CM | POA: Diagnosis not present

## 2024-01-30 MED ORDER — MELATONIN 3 MG TABLET
ORAL_TABLET | Freq: Every evening | ORAL | 0 refills | 30.00000 days | PRN
Start: 2024-01-30 — End: ?

## 2024-01-30 MED ORDER — CHOLECALCIFEROL (VITAMIN D3) 125 MCG (5,000 UNIT) TABLET
ORAL_TABLET | Freq: Every day | ORAL | 11 refills | 30.00000 days
Start: 2024-01-30 — End: 2025-01-29

## 2024-01-30 MED ORDER — METHOCARBAMOL 500 MG TABLET
ORAL_TABLET | Freq: Three times a day (TID) | ORAL | 0 refills | 5.00000 days | PRN
Start: 2024-01-30 — End: 2024-02-04

## 2024-01-30 MED ORDER — INSULIN GLARGINE (U-100) 100 UNIT/ML SUBCUTANEOUS SOLUTION
Freq: Every evening | SUBCUTANEOUS | 2 refills | 30.00000 days
Start: 2024-01-30 — End: 2024-04-29

## 2024-01-30 MED ORDER — INSULIN LISPRO (U-100) 100 UNIT/ML SUBCUTANEOUS PEN
1 refills | 0.00000 days
Start: 2024-01-30 — End: ?

## 2024-01-30 MED ORDER — TRAZODONE 50 MG TABLET
ORAL_TABLET | Freq: Once | ORAL | 3 refills | 0.00000 days | Status: CP | PRN
Start: 2024-01-30 — End: 2024-02-29

## 2024-01-30 MED ORDER — OXYCODONE 5 MG TABLET
ORAL_TABLET | Freq: Three times a day (TID) | ORAL | 0 refills | 4.00000 days | PRN
Start: 2024-01-30 — End: 2024-02-02

## 2024-01-30 MED ORDER — CALCIUM 200 MG (AS CALCIUM CARBONATE 500 MG) CHEWABLE TABLET
ORAL_TABLET | Freq: Every day | ORAL | 2 refills | 30.00000 days
Start: 2024-01-30 — End: 2024-04-29

## 2024-01-30 MED ORDER — LIDOCAINE 4 % TOPICAL PATCH
MEDICATED_PATCH | Freq: Every day | TRANSDERMAL | 1 refills | 7.00000 days
Start: 2024-01-30 — End: ?

## 2024-01-30 MED ORDER — ACETAMINOPHEN 500 MG TABLET
ORAL_TABLET | Freq: Three times a day (TID) | ORAL | 0 refills | 5.00000 days
Start: 2024-01-30 — End: ?

## 2024-01-31 DIAGNOSIS — L97522 Non-pressure chronic ulcer of other part of left foot with fat layer exposed: Secondary | ICD-10-CM | POA: Diagnosis not present

## 2024-01-31 DIAGNOSIS — Z741 Need for assistance with personal care: Secondary | ICD-10-CM | POA: Diagnosis not present

## 2024-01-31 DIAGNOSIS — S2242XD Multiple fractures of ribs, left side, subsequent encounter for fracture with routine healing: Secondary | ICD-10-CM | POA: Diagnosis not present

## 2024-01-31 DIAGNOSIS — E1140 Type 2 diabetes mellitus with diabetic neuropathy, unspecified: Secondary | ICD-10-CM | POA: Diagnosis not present

## 2024-01-31 MED ORDER — ENOXAPARIN 40 MG/0.4 ML SUBCUTANEOUS SYRINGE
SUBCUTANEOUS | 0 refills | 30.00000 days
Start: 2024-01-31 — End: ?

## 2024-02-02 DIAGNOSIS — E1165 Type 2 diabetes mellitus with hyperglycemia: Secondary | ICD-10-CM | POA: Diagnosis not present

## 2024-02-02 DIAGNOSIS — L97522 Non-pressure chronic ulcer of other part of left foot with fat layer exposed: Secondary | ICD-10-CM | POA: Diagnosis not present

## 2024-02-02 DIAGNOSIS — N1831 Chronic kidney disease, stage 3a: Secondary | ICD-10-CM | POA: Diagnosis not present

## 2024-02-02 DIAGNOSIS — D649 Anemia, unspecified: Secondary | ICD-10-CM | POA: Diagnosis not present

## 2024-02-04 DIAGNOSIS — L97522 Non-pressure chronic ulcer of other part of left foot with fat layer exposed: Secondary | ICD-10-CM | POA: Diagnosis not present

## 2024-02-05 DIAGNOSIS — E441 Mild protein-calorie malnutrition: Secondary | ICD-10-CM | POA: Diagnosis not present

## 2024-02-05 DIAGNOSIS — G309 Alzheimer's disease, unspecified: Secondary | ICD-10-CM | POA: Diagnosis not present

## 2024-02-06 DIAGNOSIS — R451 Restlessness and agitation: Secondary | ICD-10-CM | POA: Diagnosis not present

## 2024-02-06 DIAGNOSIS — K219 Gastro-esophageal reflux disease without esophagitis: Secondary | ICD-10-CM | POA: Diagnosis not present

## 2024-02-09 DIAGNOSIS — K219 Gastro-esophageal reflux disease without esophagitis: Secondary | ICD-10-CM | POA: Diagnosis not present

## 2024-02-09 DIAGNOSIS — E1140 Type 2 diabetes mellitus with diabetic neuropathy, unspecified: Secondary | ICD-10-CM | POA: Diagnosis not present

## 2024-02-09 LAB — CULTURE, FUNGUS WITHOUT SMEAR

## 2024-02-10 DIAGNOSIS — E1140 Type 2 diabetes mellitus with diabetic neuropathy, unspecified: Secondary | ICD-10-CM | POA: Diagnosis not present

## 2024-02-10 DIAGNOSIS — E663 Overweight: Secondary | ICD-10-CM | POA: Diagnosis not present

## 2024-02-11 DIAGNOSIS — N189 Chronic kidney disease, unspecified: Secondary | ICD-10-CM | POA: Diagnosis not present

## 2024-02-11 DIAGNOSIS — E785 Hyperlipidemia, unspecified: Secondary | ICD-10-CM | POA: Diagnosis not present

## 2024-02-12 DIAGNOSIS — I10 Essential (primary) hypertension: Secondary | ICD-10-CM | POA: Diagnosis not present

## 2024-02-12 DIAGNOSIS — G309 Alzheimer's disease, unspecified: Secondary | ICD-10-CM | POA: Diagnosis not present

## 2024-02-12 DIAGNOSIS — J90 Pleural effusion, not elsewhere classified: Secondary | ICD-10-CM | POA: Diagnosis not present

## 2024-02-12 DIAGNOSIS — S72002D Fracture of unspecified part of neck of left femur, subsequent encounter for closed fracture with routine healing: Secondary | ICD-10-CM | POA: Diagnosis not present

## 2024-02-13 DIAGNOSIS — G309 Alzheimer's disease, unspecified: Secondary | ICD-10-CM | POA: Diagnosis not present

## 2024-02-13 DIAGNOSIS — I10 Essential (primary) hypertension: Secondary | ICD-10-CM | POA: Diagnosis not present

## 2024-02-13 DIAGNOSIS — E1140 Type 2 diabetes mellitus with diabetic neuropathy, unspecified: Secondary | ICD-10-CM | POA: Diagnosis not present

## 2024-02-13 DIAGNOSIS — R296 Repeated falls: Secondary | ICD-10-CM | POA: Diagnosis not present

## 2024-02-16 DIAGNOSIS — B3789 Other sites of candidiasis: Secondary | ICD-10-CM | POA: Diagnosis not present

## 2024-02-16 DIAGNOSIS — I10 Essential (primary) hypertension: Secondary | ICD-10-CM | POA: Diagnosis not present

## 2024-02-16 DIAGNOSIS — D696 Thrombocytopenia, unspecified: Secondary | ICD-10-CM | POA: Diagnosis not present

## 2024-02-16 DIAGNOSIS — D649 Anemia, unspecified: Secondary | ICD-10-CM | POA: Diagnosis not present

## 2024-02-16 DIAGNOSIS — C9000 Multiple myeloma not having achieved remission: Secondary | ICD-10-CM | POA: Diagnosis not present

## 2024-02-16 DIAGNOSIS — K219 Gastro-esophageal reflux disease without esophagitis: Secondary | ICD-10-CM | POA: Diagnosis not present

## 2024-02-17 DIAGNOSIS — E1140 Type 2 diabetes mellitus with diabetic neuropathy, unspecified: Secondary | ICD-10-CM | POA: Diagnosis not present

## 2024-02-18 LAB — FUNGUS CULTURE WITH STAIN

## 2024-02-18 LAB — FUNGAL ORGANISM REFLEX

## 2024-02-18 LAB — FUNGUS CULTURE RESULT

## 2024-02-19 DIAGNOSIS — R296 Repeated falls: Secondary | ICD-10-CM | POA: Diagnosis not present

## 2024-02-23 DIAGNOSIS — E441 Mild protein-calorie malnutrition: Secondary | ICD-10-CM | POA: Diagnosis not present

## 2024-02-23 DIAGNOSIS — E08620 Diabetes mellitus due to underlying condition with diabetic dermatitis: Secondary | ICD-10-CM | POA: Diagnosis not present

## 2024-02-23 DIAGNOSIS — D649 Anemia, unspecified: Secondary | ICD-10-CM | POA: Diagnosis not present

## 2024-02-23 DIAGNOSIS — N1831 Chronic kidney disease, stage 3a: Secondary | ICD-10-CM | POA: Diagnosis not present

## 2024-02-26 ENCOUNTER — Telehealth: Payer: Self-pay | Admitting: *Deleted

## 2024-02-26 DIAGNOSIS — I10 Essential (primary) hypertension: Secondary | ICD-10-CM | POA: Diagnosis not present

## 2024-02-26 DIAGNOSIS — L89151 Pressure ulcer of sacral region, stage 1: Secondary | ICD-10-CM | POA: Diagnosis not present

## 2024-02-26 DIAGNOSIS — G309 Alzheimer's disease, unspecified: Secondary | ICD-10-CM | POA: Diagnosis not present

## 2024-02-26 NOTE — Telephone Encounter (Signed)
 The daughter Diana Buddle wanted to talk to Dr. Melanee about his care he is now in a long term skilled  nursing home.  He has fallen twice and he had to get hip surgery from the last fall.  Darice is wanting to talk to Dr. Melanee with him being fallen more than 2 times and he is getting older she wants to see what Dr. Melanee thinks about should he keep going on the treatments which is the Luspatercept   or she would just leave it alone.  Dr. Melanee is going to be out of the country for 3 weeks and I told her that I would get in touch with her when she comes back in Darice says that it is okay

## 2024-02-27 DIAGNOSIS — I10 Essential (primary) hypertension: Secondary | ICD-10-CM | POA: Diagnosis not present

## 2024-03-03 LAB — ACID FAST CULTURE WITH REFLEXED SENSITIVITIES (MYCOBACTERIA): Acid Fast Culture: NEGATIVE

## 2024-03-04 DIAGNOSIS — L89151 Pressure ulcer of sacral region, stage 1: Secondary | ICD-10-CM | POA: Diagnosis not present

## 2024-03-04 DIAGNOSIS — G309 Alzheimer's disease, unspecified: Secondary | ICD-10-CM | POA: Diagnosis not present

## 2024-03-04 DIAGNOSIS — D649 Anemia, unspecified: Secondary | ICD-10-CM | POA: Diagnosis not present

## 2024-03-04 DIAGNOSIS — E1122 Type 2 diabetes mellitus with diabetic chronic kidney disease: Secondary | ICD-10-CM | POA: Diagnosis not present

## 2024-03-08 DIAGNOSIS — N390 Urinary tract infection, site not specified: Secondary | ICD-10-CM | POA: Diagnosis not present

## 2024-03-09 DIAGNOSIS — R059 Cough, unspecified: Secondary | ICD-10-CM | POA: Diagnosis not present

## 2024-03-10 DIAGNOSIS — R770 Abnormality of albumin: Secondary | ICD-10-CM | POA: Diagnosis not present

## 2024-03-10 DIAGNOSIS — N182 Chronic kidney disease, stage 2 (mild): Secondary | ICD-10-CM | POA: Diagnosis not present

## 2024-03-11 DIAGNOSIS — I10 Essential (primary) hypertension: Secondary | ICD-10-CM | POA: Diagnosis not present

## 2024-03-11 DIAGNOSIS — N3000 Acute cystitis without hematuria: Secondary | ICD-10-CM | POA: Diagnosis not present

## 2024-03-14 DIAGNOSIS — W1839XD Other fall on same level, subsequent encounter: Secondary | ICD-10-CM | POA: Diagnosis not present

## 2024-03-14 DIAGNOSIS — R2681 Unsteadiness on feet: Secondary | ICD-10-CM | POA: Diagnosis not present

## 2024-03-17 DIAGNOSIS — R2681 Unsteadiness on feet: Secondary | ICD-10-CM | POA: Diagnosis not present

## 2024-03-19 DIAGNOSIS — A419 Sepsis, unspecified organism: Secondary | ICD-10-CM | POA: Diagnosis not present

## 2024-03-19 DIAGNOSIS — E1140 Type 2 diabetes mellitus with diabetic neuropathy, unspecified: Secondary | ICD-10-CM | POA: Diagnosis not present

## 2024-03-20 DIAGNOSIS — N1831 Chronic kidney disease, stage 3a: Secondary | ICD-10-CM | POA: Diagnosis not present

## 2024-03-22 DIAGNOSIS — E1140 Type 2 diabetes mellitus with diabetic neuropathy, unspecified: Secondary | ICD-10-CM | POA: Diagnosis not present

## 2024-03-22 DIAGNOSIS — D696 Thrombocytopenia, unspecified: Secondary | ICD-10-CM | POA: Diagnosis not present

## 2024-03-22 DIAGNOSIS — D649 Anemia, unspecified: Secondary | ICD-10-CM | POA: Diagnosis not present

## 2024-03-23 DIAGNOSIS — E1140 Type 2 diabetes mellitus with diabetic neuropathy, unspecified: Secondary | ICD-10-CM | POA: Diagnosis not present

## 2024-03-23 DIAGNOSIS — E119 Type 2 diabetes mellitus without complications: Secondary | ICD-10-CM | POA: Diagnosis not present

## 2024-03-25 DIAGNOSIS — D696 Thrombocytopenia, unspecified: Secondary | ICD-10-CM | POA: Diagnosis not present

## 2024-03-25 DIAGNOSIS — D649 Anemia, unspecified: Secondary | ICD-10-CM | POA: Diagnosis not present

## 2024-03-30 ENCOUNTER — Telehealth: Payer: Self-pay | Admitting: *Deleted

## 2024-03-30 NOTE — Telephone Encounter (Signed)
 She wants to talk to Melanee about continuing him coming over or something else or stop. Phone [phone]

## 2024-03-31 NOTE — Telephone Encounter (Signed)
 I have not seen him since march 2025 and no future appts scheduled. We will need to schedule in person visit to address any concerns

## 2024-04-01 NOTE — Telephone Encounter (Signed)
 See if virtual visit is an option

## 2024-04-04 DIAGNOSIS — N390 Urinary tract infection, site not specified: Secondary | ICD-10-CM | POA: Diagnosis not present

## 2024-04-05 DIAGNOSIS — G309 Alzheimer's disease, unspecified: Secondary | ICD-10-CM | POA: Diagnosis not present

## 2024-04-05 DIAGNOSIS — I10 Essential (primary) hypertension: Secondary | ICD-10-CM | POA: Diagnosis not present

## 2024-04-05 DIAGNOSIS — R296 Repeated falls: Secondary | ICD-10-CM | POA: Diagnosis not present

## 2024-04-05 DIAGNOSIS — E1122 Type 2 diabetes mellitus with diabetic chronic kidney disease: Secondary | ICD-10-CM | POA: Diagnosis not present

## 2024-04-15 ENCOUNTER — Ambulatory Visit (INDEPENDENT_AMBULATORY_CARE_PROVIDER_SITE_OTHER): Admitting: Podiatry

## 2024-04-15 ENCOUNTER — Encounter: Payer: Self-pay | Admitting: Podiatry

## 2024-04-15 DIAGNOSIS — Z8631 Personal history of diabetic foot ulcer: Secondary | ICD-10-CM

## 2024-04-15 DIAGNOSIS — B351 Tinea unguium: Secondary | ICD-10-CM

## 2024-04-15 DIAGNOSIS — M79674 Pain in right toe(s): Secondary | ICD-10-CM | POA: Diagnosis not present

## 2024-04-15 DIAGNOSIS — M79675 Pain in left toe(s): Secondary | ICD-10-CM | POA: Diagnosis not present

## 2024-04-15 DIAGNOSIS — E1122 Type 2 diabetes mellitus with diabetic chronic kidney disease: Secondary | ICD-10-CM

## 2024-04-15 DIAGNOSIS — E119 Type 2 diabetes mellitus without complications: Secondary | ICD-10-CM

## 2024-04-15 NOTE — Progress Notes (Signed)
 Subjective:  Patient ID: Alex Smith, male    DOB: 04-22-1935,  MRN: [id number]  Alex Smith presents to clinic today for at risk foot care. Pt has h/o NIDDM with chronic kidney disease and follow up of ulceration to the {L 2nd toe. He is accompanied by his son on today's visit. Toe has completely healed. Patient is a resident at [facility]. Patient is currently in wheelchair. He has completed course of PT. He will resume PT when his benefits allow. He does have some discoloration of nails right 2nd and left 3rd toenails. Patient cannot recall any injury to toenails. He has h/o falls.          Chief Complaint  Patient presents with   [facility]    Rm2 blood sugar 128/Dr. Candyce Repress July 10,2025/A1c 7.2    PCP is [facility].  Allergies  Allergen Reactions   Rituxan [Rituximab] Rash and Other (See Comments)    Chest tightness    Blood-Group Specific Substance Other (See Comments)    Had a post transfusion reaction of red blood cells; NOW REQUIRES WASHED BLOOD CELLS   Primaxin Iv [Imipenem-Cilastatin] Other (See Comments)    Unknown reaction   Primaxin [Imipenem] Other (See Comments)    Unknown reaction Tolerates cephalosporins   Vfend [Voriconazole] Other (See Comments)    Unknown reaction   Sulfa Antibiotics Itching and Rash   Sulfacetamide Sodium Itching and Rash    Review of Systems: Negative except as noted in the HPI.  Objective: No changes noted in today's physical examination. There were no vitals filed for this visit. Alex Smith is a pleasant 89 y.o. male in NAD. AAO x 3.  Vascular status intact b/l with faintly palpable pedal pulses. CFT <3 seconds b/l. No edema. No pain with calf compression b/l. Skin temperature gradient WNL b/l. Pedal hair absent. No cyanosis or clubbing noted b/l LE.  Neurological Examination: Sensation grossly intact b/l with 10 gram monofilament. Vibratory sensation intact b/l.   Dermatological Examination: There  is evidence of subacute subungual hematoma of the bilateral 3rd toes. Nailplate remains adhered. There is no  tenderness to palpation.  No open wounds.  No pressure injuries.  Toenails 1-5 right and 1, 3, 4, 5 thick, discolored, elongated with subungual debris and pain on dorsal palpation. No hyperkeratotic lesions noted b/l. Anonychia noted L 2nd toe and left 5th toe. Nailbed(s) epithelialized.    Musculoskeletal Examination: Utilizes wheelchair for mobility assistance. Clawtoe deformity L 2nd toe. Correction for written progress note sent to [facility]. No history of amputation left 2nd toe.   Radiographs: None  Assessment/Plan: 1. Pain due to onychomycosis of toenails of both feet   2. Healed diabetic ulcer of foot   3. Type 2 diabetes mellitus with stage 3 chronic kidney disease, with long-term current use of insulin , unspecified whether stage 3a or 3b CKD (HCC)   4. Encounter for diabetic foot exam (HCC)   -Foot examination today. -Correction to written progress note as patient has not had an amputation, but left 2nd toe diabetic ulcer left 2nd toe has completely healed. -Patient was evaluated and treated. All patient's and/or POA's questions/concerns addressed on today's visit. Toenails 1-5 right foot, L hallux, L 3rd toe, and L 4th toe debrided in length and girth without incident. Continue foot and shoe inspections daily. Monitor blood glucose per PCP/Endocrinologist's recommendations. Continue soft, supportive shoe gear daily. Report any pedal injuries to medical professional. Call office if there are any questions/concerns. -Patient's  family member present. -Facility to continue fall precautions and pressure precautions. -Informed son about urgent care podiatry hours in [place] office being Mon-Thurs 5-7 pm, should he have any new problems. -Son relates patient will eventually switch over to in-house Podiatry of facility, but for the meantime, he will f/u 10  weeks. -Patient/POA to call should there be question/concern in the interim.   Return in about 10 weeks (around 06/24/2024).  Alex Smith, DPM      [place] LOCATION: [address], [place] [address]                   Office [phone]   [facility] LOCATION: [address] [place], [place] [address] Office [phone]

## 2024-04-30 DIAGNOSIS — I10 Essential (primary) hypertension: Secondary | ICD-10-CM | POA: Diagnosis not present

## 2024-04-30 DIAGNOSIS — R296 Repeated falls: Secondary | ICD-10-CM | POA: Diagnosis not present

## 2024-04-30 DIAGNOSIS — D649 Anemia, unspecified: Secondary | ICD-10-CM | POA: Diagnosis not present

## 2024-04-30 DIAGNOSIS — G309 Alzheimer's disease, unspecified: Secondary | ICD-10-CM | POA: Diagnosis not present

## 2024-05-03 DIAGNOSIS — S72002D Fracture of unspecified part of neck of left femur, subsequent encounter for closed fracture with routine healing: Secondary | ICD-10-CM | POA: Diagnosis not present

## 2024-05-03 DIAGNOSIS — R296 Repeated falls: Secondary | ICD-10-CM | POA: Diagnosis not present

## 2024-05-13 DIAGNOSIS — I10 Essential (primary) hypertension: Secondary | ICD-10-CM | POA: Diagnosis not present

## 2024-05-13 DIAGNOSIS — E1122 Type 2 diabetes mellitus with diabetic chronic kidney disease: Secondary | ICD-10-CM | POA: Diagnosis not present

## 2024-05-17 DIAGNOSIS — I10 Essential (primary) hypertension: Secondary | ICD-10-CM | POA: Diagnosis not present

## 2024-05-18 DIAGNOSIS — E1122 Type 2 diabetes mellitus with diabetic chronic kidney disease: Secondary | ICD-10-CM | POA: Diagnosis not present

## 2024-05-18 DIAGNOSIS — D696 Thrombocytopenia, unspecified: Secondary | ICD-10-CM | POA: Diagnosis not present

## 2024-05-18 DIAGNOSIS — C9141 Hairy cell leukemia, in remission: Secondary | ICD-10-CM | POA: Diagnosis not present

## 2024-05-18 DIAGNOSIS — E08620 Diabetes mellitus due to underlying condition with diabetic dermatitis: Secondary | ICD-10-CM | POA: Diagnosis not present

## 2024-05-18 DIAGNOSIS — C9000 Multiple myeloma not having achieved remission: Secondary | ICD-10-CM | POA: Diagnosis not present

## 2024-05-23 DIAGNOSIS — R296 Repeated falls: Secondary | ICD-10-CM | POA: Diagnosis not present

## 2024-05-24 DIAGNOSIS — R059 Cough, unspecified: Secondary | ICD-10-CM | POA: Diagnosis not present

## 2024-06-17 ENCOUNTER — Encounter: Payer: Self-pay | Admitting: Hematology and Oncology

## 2024-06-17 DIAGNOSIS — R4182 Altered mental status, unspecified: Secondary | ICD-10-CM | POA: Diagnosis not present

## 2024-06-17 DIAGNOSIS — G309 Alzheimer's disease, unspecified: Secondary | ICD-10-CM | POA: Diagnosis not present

## 2024-06-17 DIAGNOSIS — N390 Urinary tract infection, site not specified: Secondary | ICD-10-CM | POA: Diagnosis not present

## 2024-06-18 ENCOUNTER — Encounter: Payer: Self-pay | Admitting: Hematology and Oncology

## 2024-06-18 DIAGNOSIS — A419 Sepsis, unspecified organism: Secondary | ICD-10-CM | POA: Diagnosis not present

## 2024-06-18 DIAGNOSIS — E119 Type 2 diabetes mellitus without complications: Secondary | ICD-10-CM | POA: Diagnosis not present

## 2024-06-18 DIAGNOSIS — E08620 Diabetes mellitus due to underlying condition with diabetic dermatitis: Secondary | ICD-10-CM | POA: Diagnosis not present

## 2024-06-18 DIAGNOSIS — E1165 Type 2 diabetes mellitus with hyperglycemia: Secondary | ICD-10-CM | POA: Diagnosis not present

## 2024-06-18 DIAGNOSIS — E1122 Type 2 diabetes mellitus with diabetic chronic kidney disease: Secondary | ICD-10-CM | POA: Diagnosis not present

## 2024-06-18 DIAGNOSIS — C9000 Multiple myeloma not having achieved remission: Secondary | ICD-10-CM | POA: Diagnosis not present

## 2024-06-18 DIAGNOSIS — E1140 Type 2 diabetes mellitus with diabetic neuropathy, unspecified: Secondary | ICD-10-CM | POA: Diagnosis not present

## 2024-06-21 DIAGNOSIS — N390 Urinary tract infection, site not specified: Secondary | ICD-10-CM | POA: Diagnosis not present

## 2024-06-21 DIAGNOSIS — R059 Cough, unspecified: Secondary | ICD-10-CM | POA: Diagnosis not present

## 2024-06-21 DIAGNOSIS — D696 Thrombocytopenia, unspecified: Secondary | ICD-10-CM | POA: Diagnosis not present

## 2024-06-24 ENCOUNTER — Encounter: Payer: Self-pay | Admitting: Podiatry

## 2024-06-24 ENCOUNTER — Ambulatory Visit: Admitting: Podiatry

## 2024-06-24 DIAGNOSIS — S90221D Contusion of right lesser toe(s) with damage to nail, subsequent encounter: Secondary | ICD-10-CM

## 2024-06-24 DIAGNOSIS — N183 Chronic kidney disease, stage 3 unspecified: Secondary | ICD-10-CM | POA: Diagnosis not present

## 2024-06-24 DIAGNOSIS — S90414A Abrasion, right lesser toe(s), initial encounter: Secondary | ICD-10-CM

## 2024-06-24 DIAGNOSIS — E1122 Type 2 diabetes mellitus with diabetic chronic kidney disease: Secondary | ICD-10-CM | POA: Diagnosis not present

## 2024-06-24 DIAGNOSIS — E119 Type 2 diabetes mellitus without complications: Secondary | ICD-10-CM | POA: Diagnosis not present

## 2024-06-24 DIAGNOSIS — Z794 Long term (current) use of insulin: Secondary | ICD-10-CM

## 2024-06-24 DIAGNOSIS — B351 Tinea unguium: Secondary | ICD-10-CM

## 2024-06-24 DIAGNOSIS — I10 Essential (primary) hypertension: Secondary | ICD-10-CM | POA: Diagnosis not present

## 2024-06-24 DIAGNOSIS — E559 Vitamin D deficiency, unspecified: Secondary | ICD-10-CM | POA: Diagnosis not present

## 2024-06-24 NOTE — Progress Notes (Signed)
 Subjective:  Patient ID: Alex Smith, male    DOB: 24-Jun-1935,  MRN: [id number]  Alex Smith presents to clinic today for at risk foot care. Pt has h/o NIDDM with chronic kidney disease and painful thick toenails that are difficult to trim. Pain interferes with ambulation. Aggravating factors include wearing enclosed shoe gear. Pain is relieved with periodic professional debridement. He is accompanied by his son on today's visit. Son states patient will try [facility] services January 2026. Chief Complaint  Patient presents with   Nail Problem    Thick painful toenails, 9 week follow up    New problem(s): None.   PCP is [facility].  Allergies  Allergen Reactions   Rituxan [Rituximab] Rash and Other (See Comments)    Chest tightness    Blood-Group Specific Substance Other (See Comments)    Had a post transfusion reaction of red blood cells; NOW REQUIRES WASHED BLOOD CELLS   Primaxin Iv [Imipenem-Cilastatin] Other (See Comments)    Unknown reaction   Primaxin [Imipenem] Other (See Comments)    Unknown reaction Tolerates cephalosporins   Vfend [Voriconazole] Other (See Comments)    Unknown reaction   Sulfa Antibiotics Itching and Rash   Sulfacetamide Sodium Itching and Rash    Review of Systems: Negative except as noted in the HPI. Objective:  There were no vitals filed for this visit. Alex Smith is a pleasant 89 y.o. male in NAD. AAO x 3. Sitting up in wheelchair. Conversant and answers questions appropriately.  Vascular Examination: Faintly palpable pedal pulses. CFT <3 seconds b/l. No edema. No pain with calf compression b/l. Skin temperature gradient WNL b/l. Pedal hair absent. No cyanosis or clubbing noted b/l LE. No ischemia or gangrene noted b/l LE.  Neurological Examination: Sensation grossly intact b/l with 10 gram monofilament. Vibratory sensation intact b/l.   Dermatological Examination: There is evidence of  subacute subungual hematoma right 2nd toe. Nailplate remains adhered. There is no  tenderness to palpation. He does have 2 healing abrasions on the dorsal aspect of the right 2nd digit.  No open wounds.  No pressure injuries.  Toenails 1-5 right and 1, 3, 4, 5 thick, discolored, elongated with subungual debris and pain on dorsal palpation. No hyperkeratotic lesions noted b/l. Anonychia noted L 2nd toe. Nailbed(s) epithelialized.    Musculoskeletal Examination: Utilizes wheelchair for mobility assistance. Clawtoe deformity L 2nd toe. Correction for written progress note sent to [facility]. No history of amputation left 2nd toe.   Radiographs: None  Assessment/Plan: 1. Pain due to onychomycosis of toenails of both feet   2. Abrasion of lesser toe of right foot, initial encounter   3. Subungual hematoma of second toe of right foot, subsequent encounter   4. Type 2 diabetes mellitus with stage 3 chronic kidney disease, with long-term current use of insulin , unspecified whether stage 3a or 3b CKD (HCC)   Consent given for treatment. Patient examined. All patient's and/or POA's questions/concerns addressed on today's visit. Toenails 1-5 right foot, L hallux, L 3rd toe, L 4th toe, and L 5th toe debrided in length and girth without incident. Continue foot and shoe inspections daily. Monitor blood glucose per PCP/Endocrinologist's recommendations. Continue soft, supportive shoe gear daily. Report any pedal injuries to medical professional. Call office if there are any questions/concerns. -Patient's family member present. All questions/concerns addressed on today's visit. -Abrasions right 2nd digit are healing.  -Discussed old subungual hematoma right 2nd digit. Asymptomatic. No signs of infection. Monitor  for any changes. Should eventually grow out as nail grows out. -Facility to continue fall precautions and pressure precautions. Patient will try [facility] for January visit per son.   -Patient/POA to call should there be question/concern in the interim.   Return if symptoms worsen or fail to improve.  Alex Smith, DPM      [place] LOCATION: [address] [place] [address]                   Office [phone]   [facility] LOCATION: [address] [place], [place] [address] Office [phone]

## 2024-06-25 DIAGNOSIS — N390 Urinary tract infection, site not specified: Secondary | ICD-10-CM | POA: Diagnosis not present

## 2024-06-25 DIAGNOSIS — D61818 Other pancytopenia: Secondary | ICD-10-CM | POA: Diagnosis not present

## 2024-06-25 DIAGNOSIS — G309 Alzheimer's disease, unspecified: Secondary | ICD-10-CM | POA: Diagnosis not present

## 2024-06-30 ENCOUNTER — Ambulatory Visit: Payer: PPO | Admitting: Dermatology

## 2024-07-01 DIAGNOSIS — I10 Essential (primary) hypertension: Secondary | ICD-10-CM | POA: Diagnosis not present

## 2024-07-01 DIAGNOSIS — G309 Alzheimer's disease, unspecified: Secondary | ICD-10-CM | POA: Diagnosis not present

## 2024-07-01 DIAGNOSIS — E1122 Type 2 diabetes mellitus with diabetic chronic kidney disease: Secondary | ICD-10-CM | POA: Diagnosis not present

## 2024-07-01 DIAGNOSIS — D61818 Other pancytopenia: Secondary | ICD-10-CM | POA: Diagnosis not present

## 2024-07-05 DIAGNOSIS — N390 Urinary tract infection, site not specified: Secondary | ICD-10-CM | POA: Diagnosis not present

## 2024-07-05 DIAGNOSIS — R296 Repeated falls: Secondary | ICD-10-CM | POA: Diagnosis not present

## 2024-07-05 DIAGNOSIS — G309 Alzheimer's disease, unspecified: Secondary | ICD-10-CM | POA: Diagnosis not present

## 2024-07-05 DIAGNOSIS — D696 Thrombocytopenia, unspecified: Secondary | ICD-10-CM | POA: Diagnosis not present

## 2024-07-07 DIAGNOSIS — E1122 Type 2 diabetes mellitus with diabetic chronic kidney disease: Secondary | ICD-10-CM | POA: Diagnosis not present

## 2024-07-09 DIAGNOSIS — L89151 Pressure ulcer of sacral region, stage 1: Secondary | ICD-10-CM | POA: Diagnosis not present

## 2024-07-09 DIAGNOSIS — N390 Urinary tract infection, site not specified: Secondary | ICD-10-CM | POA: Diagnosis not present

## 2024-07-09 DIAGNOSIS — R4182 Altered mental status, unspecified: Secondary | ICD-10-CM | POA: Diagnosis not present

## 2024-07-09 DIAGNOSIS — E119 Type 2 diabetes mellitus without complications: Secondary | ICD-10-CM | POA: Diagnosis not present

## 2024-07-10 DIAGNOSIS — E1122 Type 2 diabetes mellitus with diabetic chronic kidney disease: Secondary | ICD-10-CM | POA: Diagnosis not present

## 2024-07-13 DIAGNOSIS — G309 Alzheimer's disease, unspecified: Secondary | ICD-10-CM | POA: Diagnosis not present

## 2024-07-13 DIAGNOSIS — R296 Repeated falls: Secondary | ICD-10-CM | POA: Diagnosis not present

## 2024-07-13 DIAGNOSIS — E1122 Type 2 diabetes mellitus with diabetic chronic kidney disease: Secondary | ICD-10-CM | POA: Diagnosis not present

## 2024-07-14 ENCOUNTER — Telehealth: Payer: Self-pay | Admitting: Oncology

## 2024-07-14 DIAGNOSIS — E441 Mild protein-calorie malnutrition: Secondary | ICD-10-CM | POA: Diagnosis not present

## 2024-07-14 DIAGNOSIS — R4182 Altered mental status, unspecified: Secondary | ICD-10-CM | POA: Diagnosis not present

## 2024-07-14 DIAGNOSIS — D46Z Other myelodysplastic syndromes: Secondary | ICD-10-CM

## 2024-07-14 NOTE — Telephone Encounter (Signed)
 Sabrina at [facility] called to schedule this patient a follow up appointment. His last appointments 01/28/24 were cancelled due to hospitalized/ broke hip. Please advise scheduling.    Samule would like a call back to schedule the appointment

## 2024-07-17 DIAGNOSIS — N390 Urinary tract infection, site not specified: Secondary | ICD-10-CM | POA: Diagnosis not present

## 2024-07-17 DIAGNOSIS — L89151 Pressure ulcer of sacral region, stage 1: Secondary | ICD-10-CM | POA: Diagnosis not present

## 2024-07-17 DIAGNOSIS — E1140 Type 2 diabetes mellitus with diabetic neuropathy, unspecified: Secondary | ICD-10-CM | POA: Diagnosis not present

## 2024-07-17 DIAGNOSIS — N1831 Chronic kidney disease, stage 3a: Secondary | ICD-10-CM | POA: Diagnosis not present

## 2024-07-18 NOTE — Telephone Encounter (Signed)
 See me on 12/9 with cbc with diff myeloma panel and FLC

## 2024-07-19 ENCOUNTER — Encounter: Payer: Self-pay | Admitting: Hematology and Oncology

## 2024-07-19 NOTE — Addendum Note (Signed)
 Addended by: FAUSTINO BENCE on: 07/19/2024 11:13 AM   Modules accepted: Orders

## 2024-07-24 DIAGNOSIS — N390 Urinary tract infection, site not specified: Secondary | ICD-10-CM | POA: Diagnosis not present

## 2024-07-27 ENCOUNTER — Inpatient Hospital Stay: Admitting: Oncology

## 2024-07-27 ENCOUNTER — Inpatient Hospital Stay

## 2024-07-30 ENCOUNTER — Inpatient Hospital Stay: Admitting: Oncology

## 2024-07-30 ENCOUNTER — Inpatient Hospital Stay

## 2024-11-04 ENCOUNTER — Encounter (INDEPENDENT_AMBULATORY_CARE_PROVIDER_SITE_OTHER): Admitting: Ophthalmology

## 2024-11-18 ENCOUNTER — Encounter (INDEPENDENT_AMBULATORY_CARE_PROVIDER_SITE_OTHER): Admitting: Ophthalmology

## 2024-11-25 ENCOUNTER — Encounter (INDEPENDENT_AMBULATORY_CARE_PROVIDER_SITE_OTHER): Admitting: Ophthalmology
